# Patient Record
Sex: Female | Born: 1970
Health system: Southern US, Community
[De-identification: ages and names within clinical notes are randomized; demographics above are authoritative.]

## PROBLEM LIST (undated history)

## (undated) ENCOUNTER — Emergency Department (HOSPITAL_COMMUNITY): Admission: EM | Discharge status: Absent without leave | Payer: Medicaid Other

## (undated) DIAGNOSIS — I998 Other disorder of circulatory system: Secondary | ICD-10-CM

## (undated) DIAGNOSIS — J449 Chronic obstructive pulmonary disease, unspecified: Secondary | ICD-10-CM

## (undated) DIAGNOSIS — G43909 Migraine, unspecified, not intractable, without status migrainosus: Secondary | ICD-10-CM

## (undated) DIAGNOSIS — G4730 Sleep apnea, unspecified: Secondary | ICD-10-CM

## (undated) DIAGNOSIS — E669 Obesity, unspecified: Secondary | ICD-10-CM

## (undated) DIAGNOSIS — E119 Type 2 diabetes mellitus without complications: Secondary | ICD-10-CM

## (undated) DIAGNOSIS — R51 Headache: Secondary | ICD-10-CM

## (undated) DIAGNOSIS — I639 Cerebral infarction, unspecified: Secondary | ICD-10-CM

## (undated) DIAGNOSIS — E7800 Pure hypercholesterolemia, unspecified: Secondary | ICD-10-CM

## (undated) DIAGNOSIS — M545 Low back pain: Secondary | ICD-10-CM

## (undated) DIAGNOSIS — G8929 Other chronic pain: Secondary | ICD-10-CM

## (undated) DIAGNOSIS — F329 Major depressive disorder, single episode, unspecified: Secondary | ICD-10-CM

## (undated) DIAGNOSIS — F32A Depression, unspecified: Secondary | ICD-10-CM

## (undated) DIAGNOSIS — F419 Anxiety disorder, unspecified: Secondary | ICD-10-CM

## (undated) DIAGNOSIS — I10 Essential (primary) hypertension: Secondary | ICD-10-CM

## (undated) HISTORY — DX: Chronic obstructive pulmonary disease, unspecified: J44.9

## (undated) HISTORY — PX: CARDIAC CATHETERIZATION: SHX172

## (undated) HISTORY — DX: Cerebral infarction, unspecified: I63.9

---

## 2002-08-28 DIAGNOSIS — E119 Type 2 diabetes mellitus without complications: Secondary | ICD-10-CM

## 2002-08-28 HISTORY — DX: Type 2 diabetes mellitus without complications: E11.9

## 2006-12-16 ENCOUNTER — Observation Stay (HOSPITAL_COMMUNITY): Admission: EM | Admit: 2006-12-16 | Discharge: 2006-12-18 | Payer: Self-pay | Admitting: Emergency Medicine

## 2006-12-20 ENCOUNTER — Ambulatory Visit: Payer: Self-pay | Admitting: Nurse Practitioner

## 2007-01-22 ENCOUNTER — Ambulatory Visit: Payer: Self-pay | Admitting: *Deleted

## 2007-02-07 ENCOUNTER — Ambulatory Visit: Payer: Self-pay | Admitting: Internal Medicine

## 2007-02-08 ENCOUNTER — Ambulatory Visit (HOSPITAL_COMMUNITY): Admission: RE | Admit: 2007-02-08 | Discharge: 2007-02-08 | Payer: Self-pay | Admitting: Family Medicine

## 2008-01-28 ENCOUNTER — Emergency Department (HOSPITAL_COMMUNITY): Admission: EM | Admit: 2008-01-28 | Discharge: 2008-01-28 | Payer: Self-pay | Admitting: Emergency Medicine

## 2008-09-15 ENCOUNTER — Emergency Department (HOSPITAL_COMMUNITY): Admission: EM | Admit: 2008-09-15 | Discharge: 2008-09-15 | Payer: Self-pay | Admitting: Family Medicine

## 2008-11-20 ENCOUNTER — Emergency Department (HOSPITAL_COMMUNITY): Admission: EM | Admit: 2008-11-20 | Discharge: 2008-11-20 | Payer: Self-pay | Admitting: Emergency Medicine

## 2009-01-12 ENCOUNTER — Emergency Department (HOSPITAL_COMMUNITY): Admission: EM | Admit: 2009-01-12 | Discharge: 2009-01-12 | Payer: Self-pay | Admitting: Emergency Medicine

## 2009-11-23 ENCOUNTER — Emergency Department (HOSPITAL_COMMUNITY): Admission: EM | Admit: 2009-11-23 | Discharge: 2009-11-23 | Payer: Self-pay | Admitting: Emergency Medicine

## 2010-03-30 ENCOUNTER — Emergency Department (HOSPITAL_COMMUNITY): Admission: EM | Admit: 2010-03-30 | Discharge: 2010-03-30 | Payer: Self-pay | Admitting: Emergency Medicine

## 2010-04-19 ENCOUNTER — Ambulatory Visit: Payer: Self-pay | Admitting: Internal Medicine

## 2010-11-11 LAB — CBC
Hemoglobin: 13.2 g/dL (ref 12.0–15.0)
MCH: 24.7 pg — ABNORMAL LOW (ref 26.0–34.0)
MCHC: 33.2 g/dL (ref 30.0–36.0)
RBC: 5.35 MIL/uL — ABNORMAL HIGH (ref 3.87–5.11)
RDW: 15.4 % (ref 11.5–15.5)

## 2010-11-11 LAB — BASIC METABOLIC PANEL
BUN: 8 mg/dL (ref 6–23)
Chloride: 98 mEq/L (ref 96–112)
Creatinine, Ser: 0.92 mg/dL (ref 0.4–1.2)
Glucose, Bld: 161 mg/dL — ABNORMAL HIGH (ref 70–99)
Potassium: 3.5 mEq/L (ref 3.5–5.1)
Sodium: 135 mEq/L (ref 135–145)

## 2010-11-11 LAB — BRAIN NATRIURETIC PEPTIDE: Pro B Natriuretic peptide (BNP): 92 pg/mL (ref 0.0–100.0)

## 2010-11-11 LAB — D-DIMER, QUANTITATIVE: D-Dimer, Quant: 0.26 ug/mL-FEU (ref 0.00–0.48)

## 2010-11-11 LAB — DIFFERENTIAL
Eosinophils Absolute: 0.3 10*3/uL (ref 0.0–0.7)
Lymphocytes Relative: 34 % (ref 12–46)
Lymphs Abs: 2.9 10*3/uL (ref 0.7–4.0)

## 2010-11-11 LAB — GLUCOSE, CAPILLARY: Glucose-Capillary: 168 mg/dL — ABNORMAL HIGH (ref 70–99)

## 2010-11-11 LAB — TROPONIN I: Troponin I: 0.01 ng/mL (ref 0.00–0.06)

## 2010-11-20 LAB — CBC
HCT: 41.4 % (ref 36.0–46.0)
Hemoglobin: 13.3 g/dL (ref 12.0–15.0)
MCHC: 32.2 g/dL (ref 30.0–36.0)
Platelets: 277 10*3/uL (ref 150–400)
RBC: 5.37 MIL/uL — ABNORMAL HIGH (ref 3.87–5.11)
RDW: 17.1 % — ABNORMAL HIGH (ref 11.5–15.5)
WBC: 10.5 10*3/uL (ref 4.0–10.5)

## 2010-11-20 LAB — DIFFERENTIAL
Basophils Absolute: 0 10*3/uL (ref 0.0–0.1)
Basophils Relative: 0 % (ref 0–1)
Eosinophils Absolute: 0.2 10*3/uL (ref 0.0–0.7)
Eosinophils Relative: 1 % (ref 0–5)
Lymphocytes Relative: 35 % (ref 12–46)
Monocytes Absolute: 0.9 10*3/uL (ref 0.1–1.0)

## 2010-11-20 LAB — COMPREHENSIVE METABOLIC PANEL
ALT: 16 U/L (ref 0–35)
Albumin: 3.8 g/dL (ref 3.5–5.2)
CO2: 27 mEq/L (ref 19–32)
Calcium: 9.4 mg/dL (ref 8.4–10.5)
Chloride: 102 mEq/L (ref 96–112)
GFR calc Af Amer: >60 mL/min (ref >60)
Glucose, Bld: 130 mg/dL — ABNORMAL HIGH (ref 70–99)
Potassium: 2.8 mEq/L — ABNORMAL LOW (ref 3.5–5.1)
Sodium: 137 mEq/L (ref 135–145)
Total Bilirubin: 0.8 mg/dL (ref 0.3–1.2)

## 2010-11-20 LAB — RAPID URINE DRUG SCREEN, HOSP PERFORMED: Barbiturates: NOT DETECTED

## 2010-11-20 LAB — ETHANOL: Alcohol, Ethyl (B): <5 mg/dL (ref 0–10)

## 2010-12-06 LAB — POCT CARDIAC MARKERS
CKMB, poc: 2.2 ng/mL (ref 1.0–8.0)
Troponin i, poc: <0.05 ng/mL (ref 0.00–0.09)

## 2010-12-06 LAB — BASIC METABOLIC PANEL
BUN: 8 mg/dL (ref 6–23)
Chloride: 101 mEq/L (ref 96–112)
Glucose, Bld: 149 mg/dL — ABNORMAL HIGH (ref 70–99)
Potassium: 2.8 mEq/L — ABNORMAL LOW (ref 3.5–5.1)
Sodium: 136 mEq/L (ref 135–145)

## 2010-12-08 LAB — DIFFERENTIAL
Basophils Absolute: 0.1 10*3/uL (ref 0.0–0.1)
Lymphocytes Relative: 30 % (ref 12–46)
Monocytes Absolute: 0.6 10*3/uL (ref 0.1–1.0)
Neutro Abs: 5.6 10*3/uL (ref 1.7–7.7)
Neutrophils Relative %: 61 % (ref 43–77)

## 2010-12-08 LAB — POCT CARDIAC MARKERS
Myoglobin, poc: 89.1 ng/mL (ref 12–200)
Troponin i, poc: <0.05 ng/mL (ref 0.00–0.09)

## 2010-12-08 LAB — POCT I-STAT, CHEM 8
BUN: 7 mg/dL (ref 6–23)
Calcium, Ion: 1.11 mmol/L — ABNORMAL LOW (ref 1.12–1.32)
HCT: 41 % (ref 36.0–46.0)
Hemoglobin: 13.9 g/dL (ref 12.0–15.0)
Sodium: 138 mEq/L (ref 135–145)
TCO2: 27 mmol/L (ref 0–100)

## 2010-12-08 LAB — D-DIMER, QUANTITATIVE: D-Dimer, Quant: 0.27 ug/mL-FEU (ref 0.00–0.48)

## 2010-12-08 LAB — POCT PREGNANCY, URINE: Preg Test, Ur: NEGATIVE

## 2010-12-08 LAB — CBC
Hemoglobin: 12.5 g/dL (ref 12.0–15.0)
RDW: 16.5 % — ABNORMAL HIGH (ref 11.5–15.5)

## 2010-12-08 LAB — PROTIME-INR
INR: 1 (ref 0.00–1.49)
Prothrombin Time: 13.1 seconds (ref 11.6–15.2)

## 2010-12-12 LAB — GLUCOSE, CAPILLARY: Glucose-Capillary: 170 mg/dL — ABNORMAL HIGH (ref 70–99)

## 2011-01-10 NOTE — H&P (Signed)
NAMEESLI, Diamond NO.:  [id number]   MEDICAL RECORD NO.:  [id number]           PATIENT TYPE:   LOCATION:                                 FACILITY:   PHYSICIAN:  Sheliah Mends, MD      DATE OF BIRTH:  05/08/1971   DATE OF ADMISSION:  DATE OF DISCHARGE:                              HISTORY & PHYSICAL   [name] is a 40 year old obese African American female who was seen  in the emergency room for fatigue and shortness of breath.  We were  called to see for some abnormal EKG changes.  She apparently has had a  stress test in our office a couple of years ago that presumably showed  no ischemia.  Her office records are pending.  In fact, her symptoms are  most consistent with upper respiratory infection.  She says she has  recently come out of a bad relationship and started smoking some.  Her  EKG changes are not significant and actually her EKG looks the same as  it did in 2008.  Dr. Garen Lah saw her in the emergency room and feels  she can be discharged and we can follow her up as an outpatient.   Past medical history is remarkable for hypertension.  She has non-  insulin-dependent diabetes.  She has obesity and asthma.   CURRENT MEDICATIONS:  1. Labetalol 150 mg b.i.d.  2. Norvasc 10 mg a day.  3. Metformin 500 mg b.i.d.  4. Hydrochlorothiazide 25 mg a day.   She has no known drug allergies.   SOCIAL HISTORY:  She works at [employer].  She has just  resumed smoking.   Family history is remarkable that her mother had an MI in her mid 33s.   Review of systems is essentially unremarkable except as noted above.   PHYSICAL EXAMINATION:  VITAL SIGNS:  Blood pressure initially is 165/112  with a pulse 100, temperature 98.7, followup pressure is 146/96, pulse  96, respirations 12.  GENERAL:  She is a morbidly obese African American female in no acute  distress.  HEENT: Normocephalic.  Extraocular movements are intact.  Sclerae is  nonicteric.  Lids and conjunctivae are within normal limits.  NECK:  Without thyroid enlargement, without JVD.  CHEST:  Some expiratory wheezing bilaterally.  CARDIAC:  Regular rate and rhythm without murmur, rub, or gallop.  Normal S1 and S2.  ABDOMEN:  Obese.  EXTREMITIES:  Trace edema.  NEUROLOGIC:  Grossly intact.  She is awake, alert, oriented,  cooperative, and moves all extremities without obvious deficit.   EKG shows sinus rhythm with nonspecific ST changes, she has septal Q-  waves and poor anterior R-wave progression and LVH by voltage.  Her labs  in the emergency room shows sodium 138, potassium 2.9, BUN 7, creatinine  1.0.  Troponin is negative.  Pregnancy test is negative.  White count  9.2, hemoglobin 12.5, hematocrit 38.5, platelets 305.   IMPRESSION:  1. Abnormal EKG.  2. Morbid obesity.  3. Asthmatic chronic obstructive pulmonary disease and smoking.  4. Hypertension with suboptimal control.  5. Family history of coronary disease.  6. Unknown lipid status.   PLAN:  The patient was seen by Dr. Garen Lah and myself in the emergency  room.  We have added lisinopril 10 mg a day to her current medications.  Dr. Garen Lah will see her in followup as an outpatient in a couple of  weeks.  She will need a followup BMP at that time and also followup  lipids at some point.      Abelino Derrick, P.A.      Sheliah Mends, MD  Electronically Signed    LKK/MEDQ  D:  11/20/2008  T:  11/21/2008  Job:  [id number]

## 2011-01-13 NOTE — Discharge Summary (Signed)
Diamond Rice, Diamond Rice               ACCOUNT NO.:  [id number]   MEDICAL RECORD NO.:  [id number]          PATIENT TYPE:  OBV   LOCATION:  2003                         FACILITY:  [facility]   PHYSICIAN:  Michaelyn Barter, M.D. DATE OF BIRTH:  [birth year]   DATE OF ADMISSION:  12/15/2006  DATE OF DISCHARGE:  12/18/2006                               DISCHARGE SUMMARY   PRIMARY CARE DOCTOR:  Unassigned.   FINAL DIAGNOSES:  1. Chest pain.  2. Uncontrolled diabetes mellitus.   CONSULTATION:  Cardiology, with [facility].   PROCEDURES:  1. Portable chest x-ray, completed December 16, 2006.  2. Nuclear medicine Myoview stress test, completed December 17, 2006.   HISTORY OF PRESENT ILLNESS:  Diamond Rice is a 40 year old female who  arrived with a chief complaint of shortness of breath and severe chest  pressure.  She indicated that the chest pressure was located over the  left upper region of her chest.  It is described as approximately 7/10  in intensity.  She also complained that she had been experiencing chest  pain off and on for several months.   PAST MEDICAL HISTORY:  Please see that dictated by Dr. Della Goo  on December 16, 2006.   HOSPITAL COURSE:  1. Chest pain.  A portable chest x-ray was completed on December 16, 2006.  It revealed no acute chest findings.  An EKG was completed.      It revealed normal sinus rhythm, with some mildly inverted T waves      in leads V5 and V6.  The patient's cardiac markers were cycled.      Her troponin I's were 0.01, 0.02, and 0.01.  Her CK-MBs were 1.2,      1.5, and 2.  Therefore, she ruled out for a cardiac event.  Her D-      dimer was also found to be negative, with a result of less than      0.22.  [facility] was consulted.  The      patient underwent a nuclear medicine Myoview stress test on December 17, 2006.  There was no evidence of exercise-induced myocardial      ischemia seen.  The  patient's chest pain and shortness of breath      have both resolved over the course of her hospitalization.  2. Uncontrolled diabetes mellitus.  The patient's sugars have been      monitored very closely.  Her medications have been adjusted to try      to better control her sugars.   CONDITION AT THE TIME OF DISCHARGE:  On the date of discharge, the  patient indicates that she feels better.  She denies having any current  chest pain or shortness of breath.  Her temperature is 98.2, heart rate  81, respirations 18, blood pressure 129/90, O2 saturation 98% on room  air.   LABORATORIES:  Her sodium is 137, potassium 3.6, chloride 103, CO2 28,  BUN 13, creatinine 0.92, glucose 129, calcium 9.5.   The decision  has been made to discharge the patient from the hospital.  The patient indicates that she is currently living in a treatment  facility for both alcohol and substance abuse.   She will be discharged on:  1. Norvasc 10 mg 1 tablet p.o. daily.  2. Ecotrin 325 mg p.o. daily.  3. Glipizide 5 mg p.o. daily.  4. Hydrochlorothiazide 25 mg p.o. daily.  5. Labetalol 200 mg p.o. b.i.d.  6. Metformin 500 mg p.o. b.i.d.  7. Protonix 40 mg p.o. daily.      Michaelyn Barter, M.D.  Electronically Signed     OR/MEDQ  D:  12/18/2006  T:  12/18/2006  Job:  [id number]

## 2011-01-13 NOTE — H&P (Signed)
Diamond Rice, GUIZAR               ACCOUNT NO.:  [id number]   MEDICAL RECORD NO.:  [id number]          PATIENT TYPE:  INP   LOCATION:  1823                         FACILITY:  [facility]   PHYSICIAN:  Della Goo, M.D. DATE OF BIRTH:  [birth year]   DATE OF ADMISSION:  12/16/2006  DATE OF DISCHARGE:                              HISTORY & PHYSICAL   PRIMARY CARE PHYSICIAN:  Unassigned.   CHIEF COMPLAINT:  Shortness of breath and chest pain.   HISTORY OF PRESENT ILLNESS:  This is a 40 year old female who was  brought to the emergency department secondary to complaints of worsening  shortness of breath over a 24-hour period along with complaints of  severe chest pressure located in the left upper chest.  The patient  denies having any nausea, vomiting or diaphoresis associated with this  pain.  She denies that the pain radiated into the arm or neck.  The pain  did persist until the patient was seen and administered nitroglycerin  sublingual x1.  The pain was described as being a 7/10 in intensity.  The patient does report having chest pain off and on for months;  however, this time the shortness of breath was a new symptom according  to her.  The patient does report being off her medications for  approximately 8 days.  She has recently moved from [place], [place] to this area.  She has already undergone an eligibility for the  [employer]; however, she has an upcoming appointment.   PAST MEDICAL HISTORY:  Hypertension.   PAST SURGICAL HISTORY:  None.   MEDICATIONS:  1. Labetalol 150 mg one p.o. b.i.d.  2. Norvasc 10 mg 2 tablets p.o. daily.  3. Hydrochlorothiazide 25 mg one p.o. daily.   ALLERGIES:  NO KNOWN DRUG ALLERGIES.   SOCIAL HISTORY:  The patient lives with 5 roommates.  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  Tobacco history - 1-2 cigarettes twice  a month.  No history of alcohol usage.   FAMILY HISTORY:  Positive for coronary artery disease in  her mother who  died at age 6.  Positive for hypertension in both parents.  Positive  for diabetes mellitus in both parents.  No history of cancer that she  knows of.   REVIEW OF SYSTEMS:  Pertinents are mentioned above.  She denies having  any joint pain or myalgias, syncope, dizziness, GI or GU problems.  She  does report having regular menstrual periods.   PHYSICAL EXAMINATION:  GENERAL:  This is a morbidly obese 40 year old  female currently in no acute distress.  VITAL SIGNS:  Temperature 98.3, blood pressure 151/94, heart rate 87,  respirations 22, O2 saturations 100%.  HEENT:  Normocephalic, atraumatic.  Pupils equally round react to light.  Extraocular muscles are intact.  Funduscopic benign.  Oropharynx is  clear.  NECK:  Supple.  Full range of motion.  No thyromegaly, adenopathy,  jugular venous distension.  CARDIOVASCULAR:  Regular rate and rhythm.  No murmurs, gallops or rubs.  LUNGS:  Clear to auscultation bilaterally.  ABDOMEN:  Positive bowel sounds, soft,  nontender, nondistended.  EXTREMITIES:  Without edema.  NEUROLOGIC:  Alert and oriented x3.  There are no focal deficits.  GENITOURINARY/RECTAL:  Deferred.   LABORATORY STUDIES:  Hemoglobin 13.6, hematocrit 40.0.  Sodium 133,  potassium 3.6, chloride 100, CO2 of 25, BUN 10, creatinine 0.8, glucose  157.  Myoglobin 81.6, CK-MB 1.6 and troponin less than 0.05.   ELECTROCARDIOGRAM:  Normal sinus rhythm and T-wave inversion in the  anterior lateral leads.  This has been reported as being an age  indeterminate anteroseptal infarction.   ASSESSMENT:  30. A 40 year old female with a history of severe hypertension being      admitted with chest pain.  2. Hypertension.  3. Mild hyperglycemia.  4. Morbid obesity.   PLAN:  The patient will be admitted to telemetry area for cardiac  monitoring.  Nitrates, oxygen, aspirin and beta blocker therapy will be  continued.  She will continue on her regular medications.  Lovenox  has  been written at DVT prophylaxis dosage at this time.  GI prophylaxis has  also been ordered.  CBGs will be ordered q.4h. and a hemoglobin A1c will  be checked along with sliding scale insulin coverage p.r.n.  The patient  has also been written for pain control with IV Dilaudid p.r.n.  Further  workup will be considered by the rounding team pending results of the  these studies.  Further workup may also ensue an outpatient if the  cardiac enzymes return negative x3.      Della Goo, M.D.  Electronically Signed     HJ/MEDQ  D:  12/16/2006  T:  12/16/2006  Job:  [id number]

## 2011-05-25 LAB — POCT I-STAT, CHEM 8
Chloride: 103
HCT: 41
Hemoglobin: 13.9
Potassium: 3 — ABNORMAL LOW
Sodium: 139

## 2011-10-23 ENCOUNTER — Emergency Department (HOSPITAL_COMMUNITY): Payer: Medicaid Other

## 2011-10-23 ENCOUNTER — Encounter (HOSPITAL_COMMUNITY): Payer: Self-pay | Admitting: *Deleted

## 2011-10-23 ENCOUNTER — Emergency Department (HOSPITAL_COMMUNITY)
Admission: EM | Admit: 2011-10-23 | Discharge: 2011-10-23 | Disposition: A | Discharge status: Home / Self Care | Payer: Medicaid Other | Attending: Emergency Medicine | Admitting: Emergency Medicine

## 2011-10-23 DIAGNOSIS — I10 Essential (primary) hypertension: Secondary | ICD-10-CM | POA: Insufficient documentation

## 2011-10-23 DIAGNOSIS — M25579 Pain in unspecified ankle and joints of unspecified foot: Secondary | ICD-10-CM | POA: Insufficient documentation

## 2011-10-23 DIAGNOSIS — M25473 Effusion, unspecified ankle: Secondary | ICD-10-CM | POA: Insufficient documentation

## 2011-10-23 DIAGNOSIS — S93409A Sprain of unspecified ligament of unspecified ankle, initial encounter: Secondary | ICD-10-CM | POA: Insufficient documentation

## 2011-10-23 DIAGNOSIS — M25476 Effusion, unspecified foot: Secondary | ICD-10-CM | POA: Insufficient documentation

## 2011-10-23 DIAGNOSIS — M79609 Pain in unspecified limb: Secondary | ICD-10-CM | POA: Insufficient documentation

## 2011-10-23 DIAGNOSIS — W010XXA Fall on same level from slipping, tripping and stumbling without subsequent striking against object, initial encounter: Secondary | ICD-10-CM | POA: Insufficient documentation

## 2011-10-23 DIAGNOSIS — E119 Type 2 diabetes mellitus without complications: Secondary | ICD-10-CM | POA: Insufficient documentation

## 2011-10-23 HISTORY — DX: Essential (primary) hypertension: I10

## 2011-10-23 MED ORDER — OXYCODONE-ACETAMINOPHEN 5-325 MG PO TABS: 1.0000 | ORAL_TABLET | Freq: Four times a day (QID) | ORAL | Status: AC | PRN

## 2011-10-23 NOTE — ED Provider Notes (Signed)
History     CSN: [id number]  Arrival date & time 10/23/11  1216   First MD Initiated Contact with Patient 10/23/11 1331      Chief Complaint  Patient presents with  . Fall  . Ankle Pain  . Foot Pain    (Consider location/radiation/quality/duration/timing/severity/associated sxs/prior treatment) Patient is a 41 y.o. female presenting with fall, ankle pain, and lower extremity pain. The history is provided by the patient.  Fall Incident onset: 2 weeks ago. The fall occurred while walking (tripped and fell down a hill twisting her ankle). She fell from a height of 1 to 2 ft. She landed on grass. There was no blood loss. Point of impact: Left ankle. Pain location: Left ankle. The pain is at a severity of 8/10. The pain is moderate. She was ambulatory at the scene. The symptoms are aggravated by activity and use of the injured limb. She has tried acetaminophen and ice for the symptoms. The treatment provided no relief.  Ankle Pain   Foot Pain    Past Medical History  Diagnosis Date  . Diabetes mellitus   . Hypertension     Past Surgical History  Procedure Date  . Cardiac catheterization     No family history on file.  History  Substance Use Topics  . Smoking status: Current Everyday Smoker  . Smokeless tobacco: Not on file  . Alcohol Use: Yes    OB History    Grav Para Term Preterm Abortions TAB SAB Ect Mult Living                  Review of Systems  All other systems reviewed and are negative.    Allergies  Morphine and related  Home Medications  No current outpatient prescriptions on file.  BP 151/107  Pulse 92  Temp(Src) 97.4 F (36.3 C) (Oral)  Resp 16  SpO2 99%  Physical Exam  Nursing note and vitals reviewed. Constitutional: She is oriented to person, place, and time. She appears well-developed and well-nourished. No distress.  HENT:  Head: Normocephalic and atraumatic.  Eyes: EOM are normal. Pupils are equal, round, and reactive to light.    Musculoskeletal: She exhibits tenderness.       Left ankle: She exhibits swelling. She exhibits no ecchymosis, no deformity and normal pulse. tenderness. Lateral malleolus tenderness found. No proximal fibula tenderness found.  Neurological: She is alert and oriented to person, place, and time.  Skin: Skin is warm and dry. No rash noted. No erythema.  Psychiatric: She has a normal mood and affect. Her behavior is normal.    ED Course  Procedures (including critical care time)  Labs Reviewed - No data to display Dg Ankle Complete Left  10/23/2011  *RADIOLOGY REPORT*  Clinical Data: Larey Seat.  Left ankle pain.  LEFT ANKLE COMPLETE - 3+ VIEW  Comparison: None  Findings: The ankle mortise is maintained.  No acute ankle fracture.  No osteochondral lesion.  The mid and hind foot bony structures are intact.  Mild midfoot degenerative changes.  A large calcaneal heel spur is noted.  IMPRESSION: No acute bony findings.  Original Report Authenticated By: P. Loralie Champagne, M.D.     1. Ankle sprain       MDM   Patient with symptoms concerning for an ankle sprain. She fell then the whole 2 weeks ago and has persistent swelling of her left lateral malleolus. There is no fibular head tenderness or tenderness in the lower foot. Plain film of the  ankle negative for broken bones. Feel this is a persistent sprain. Will have her use brace and crutches. In the week off of work.        Gwyneth Sprout, MD 10/23/11 [phone]

## 2011-10-23 NOTE — Discharge Instructions (Signed)
Ankle Sprain °An ankle sprain is an injury to the ligaments that hold the ankle joint together.  °CAUSES °The injury is usually caused by a fall or by twisting the ankle. It is important to tell your caregiver how the injury occurred and whether or not you were able to walk immediately after the injury.  °SYMPTOMS  °Pain is the primary symptom. It may be present at rest or only when you are trying to stand or walk. The ankle will likely be swollen. Bruising may develop immediately or after 1 or 2 days. It may be difficult or impossible to stand or walk. This depends on the severity of the sprain. °DIAGNOSIS  °Your caregiver can determine if a sprain has occurred based on the accident details and on examination of your ankle. Examination will include pressing and squeezing areas of the foot and ankle. Your caregiver will try to move the ankle in certain ways. X-rays may be used to be sure a bone was not broken, or that the ligament did not pull off of a bone (avulsion). There are standard guidelines that can reliably determine if an X-ray is needed. °TREATMENT  °Rest, ice, elevation, and compression are the basic modes of treatment. Certain types of braces can help stabilize the ankle and allow early return to walking. Your caregiver can make a recommendation for this. Medication may be recommended for pain. You may be referred to an orthopedist or a physical therapist for certain types of severe sprains. °HOME CARE INSTRUCTIONS  °· Apply ice to the sore area for 15 to 20 minutes, 3 to 4 times per day. Do this while you are awake for the first 2 days, or as directed. This can be stopped when the swelling goes away. Put the ice in a plastic bag and place a towel between the bag of ice and your skin.  °· Keep your leg elevated when possible to lessen swelling.  °· If your caregiver recommends crutches, use them as instructed with a non-weight bearing cast for 1 week. Then, you may walk on your ankle as the pain allows,  or as instructed. Gradually, put weight on the affected ankle. Continue to use crutches or a cane until you can walk without causing pain.  °· If a plaster splint was applied, wear the splint until you are seen for a follow-up examination. Rest it on nothing harder than a pillow the first 24 hours. Do not put weight on it. Do not get it wet. You may take it off to take a shower or bath.  °· You may have been given an elastic bandage to use with the plaster splint, or you may have been given a elastic bandage to use alone. The elastic bandage is too tight if you have numbness, tingling, or if your foot becomes cold and blue. Adjust the bandage to make it comfortable.  °· If an air splint was applied, you may blow more air into it or take some out to make it more comfortable. You may take it off at night and to take a shower or bath. Wiggle your toes in the splint several times per day if you are able.  °· Only take over-the-counter or prescription medicines for pain, discomfort, or fever as directed by your caregiver.  °· Do not drive a vehicle until your caregiver specifically tells you it is safe to do so.  °SEEK MEDICAL CARE IF:  °· You have an increase in bruising, swelling, or pain.  °· Your   toes feel cold.  °· Pain relief is not achieved with medications.  °SEEK IMMEDIATE MEDICAL CARE IF: °Your toes are numb or blue or you have severe pain. °MAKE SURE YOU:  °· Understand these instructions.  °· Will watch your condition.  °· Will get help right away if you are not doing well or get worse.  °Document Released: 08/14/2005 Document Revised: 11/18/2010 Document Reviewed: 03/18/2008 °[employer]® Patient Information ©2012 [employer]. °

## 2011-10-23 NOTE — ED Notes (Signed)
Reports injured left ankle 2 weeks ago, c/o continued pain & now with tingling radiating up into calf area. No deformity, redness, edema

## 2011-10-23 NOTE — ED Notes (Signed)
Patient reports she fell 1 mth ago.  She had swelling in her left ankle that has continued and she reports she has periods of feeling numbness in her ankle and foot.

## 2011-10-23 NOTE — Progress Notes (Signed)
Orthopedic Tech Progress Note Patient Details:  ANABIA WEATHERWAX 07/22/1971 [id number]  Other Ortho Devices Type of Ortho Device: Crutches Ortho Device Interventions: Application  Type of Splint: Ankle Air Splint Interventions: Application  Applied ankle air cast per patient nurse.  Gaye Pollack 10/23/2011, 4:05 PM

## 2011-10-23 NOTE — ED Notes (Signed)
Awaiting ortho tech for air splint & crutches

## 2011-10-23 NOTE — ED Notes (Signed)
Ortho paged for crutches 

## 2012-03-24 ENCOUNTER — Emergency Department (HOSPITAL_COMMUNITY): Payer: Self-pay

## 2012-03-24 ENCOUNTER — Emergency Department (HOSPITAL_COMMUNITY)
Admission: EM | Admit: 2012-03-24 | Discharge: 2012-03-24 | Disposition: A | Discharge status: Home / Self Care | Payer: Self-pay | Attending: Emergency Medicine | Admitting: Emergency Medicine

## 2012-03-24 ENCOUNTER — Encounter (HOSPITAL_COMMUNITY): Payer: Self-pay | Admitting: *Deleted

## 2012-03-24 DIAGNOSIS — I10 Essential (primary) hypertension: Secondary | ICD-10-CM

## 2012-03-24 DIAGNOSIS — E119 Type 2 diabetes mellitus without complications: Secondary | ICD-10-CM

## 2012-03-24 DIAGNOSIS — R0602 Shortness of breath: Secondary | ICD-10-CM | POA: Insufficient documentation

## 2012-03-24 DIAGNOSIS — F17200 Nicotine dependence, unspecified, uncomplicated: Secondary | ICD-10-CM | POA: Insufficient documentation

## 2012-03-24 DIAGNOSIS — R079 Chest pain, unspecified: Secondary | ICD-10-CM

## 2012-03-24 DIAGNOSIS — M549 Dorsalgia, unspecified: Secondary | ICD-10-CM

## 2012-03-24 LAB — URINALYSIS, ROUTINE W REFLEX MICROSCOPIC
Glucose, UA: >1000 mg/dL — AB
Ketones, ur: NEGATIVE mg/dL
Leukocytes, UA: NEGATIVE
Nitrite: NEGATIVE
Protein, ur: 100 mg/dL — AB

## 2012-03-24 LAB — BASIC METABOLIC PANEL
BUN: 14 mg/dL (ref 6–23)
Chloride: 100 mEq/L (ref 96–112)
GFR calc Af Amer: 86 mL/min — ABNORMAL LOW (ref >90)
GFR calc non Af Amer: 74 mL/min — ABNORMAL LOW (ref >90)
Glucose, Bld: 302 mg/dL — ABNORMAL HIGH (ref 70–99)
Potassium: 3.8 mEq/L (ref 3.5–5.1)
Sodium: 136 mEq/L (ref 135–145)

## 2012-03-24 LAB — POCT I-STAT TROPONIN I

## 2012-03-24 LAB — CBC
HCT: 37.5 % (ref 36.0–46.0)
Hemoglobin: 12.2 g/dL (ref 12.0–15.0)
MCHC: 32.5 g/dL (ref 30.0–36.0)
WBC: 9.5 10*3/uL (ref 4.0–10.5)

## 2012-03-24 LAB — URINE MICROSCOPIC-ADD ON

## 2012-03-24 MED ORDER — METFORMIN HCL 500 MG PO TABS: 500.0000 mg | ORAL_TABLET | Freq: Two times a day (BID) | ORAL | Status: DC

## 2012-03-24 MED ORDER — KETOROLAC TROMETHAMINE 60 MG/2ML IM SOLN
60.0000 mg | Freq: Once | INTRAMUSCULAR | Status: AC
  Administered 2012-03-24: 60 mg via INTRAMUSCULAR
  Filled 2012-03-24: qty 2

## 2012-03-24 MED ORDER — ALBUTEROL SULFATE HFA 108 (90 BASE) MCG/ACT IN AERS
2.0000 | INHALATION_SPRAY | RESPIRATORY_TRACT | Status: DC | PRN
  Administered 2012-03-24: 2 via RESPIRATORY_TRACT
  Filled 2012-03-24: qty 6.7

## 2012-03-24 MED ORDER — METHOCARBAMOL 500 MG PO TABS: 500.0000 mg | ORAL_TABLET | Freq: Four times a day (QID) | ORAL | Status: AC | PRN

## 2012-03-24 MED ORDER — LISINOPRIL 10 MG PO TABS: 10.0000 mg | ORAL_TABLET | Freq: Every day | ORAL | Status: DC

## 2012-03-24 NOTE — ED Provider Notes (Signed)
History     CSN: [id number]  Arrival date & time 03/24/12  1159   First MD Initiated Contact with Patient 03/24/12 1336      Chief Complaint  Patient presents with  . Shortness of Breath  . Chest Pain  . Back Pain    (Consider location/radiation/quality/duration/timing/severity/associated sxs/prior treatment) HPI Comments: Patient presents with multiple complaints.  She complains of bilateral mid back pain for the last 3 days.  She had no acute injury or trauma.  She denies fevers or dysuria.  She states it hurts no matter what she does.  It can be worse with certain movement and twisting.  Patient notes some chest pain and shortness of breath that began this morning at rest.  She has no cough or fevers.  Patient notes no specific inciting or relieving factors.  On my medical record reviewed patient had a normal stress test in 2008.  She also reports that she had a catheterization in [place] which did not require any stenting or intervention.  The history is provided by the patient. No language interpreter was used.    Past Medical History  Diagnosis Date  . Diabetes mellitus   . Hypertension     Past Surgical History  Procedure Date  . Cardiac catheterization     History reviewed. No pertinent family history.  History  Substance Use Topics  . Smoking status: Current Everyday Smoker  . Smokeless tobacco: Not on file  . Alcohol Use: Yes    OB History    Grav Para Term Preterm Abortions TAB SAB Ect Mult Living                  Review of Systems  Constitutional: Negative.  Negative for fever and chills.  HENT: Negative.   Eyes: Negative.   Respiratory: Positive for shortness of breath. Negative for cough.   Cardiovascular: Positive for chest pain.  Gastrointestinal: Negative.  Negative for nausea, vomiting, abdominal pain and diarrhea.  Genitourinary: Negative.  Negative for dysuria.  Musculoskeletal: Positive for back pain.  Skin: Negative.  Negative for color  change and rash.  Neurological: Negative.  Negative for syncope and headaches.  Hematological: Negative.  Negative for adenopathy.  Psychiatric/Behavioral: Negative.  Negative for confusion.  All other systems reviewed and are negative.    Allergies  Morphine and related  Home Medications  No current outpatient prescriptions on file.  BP 155/97  Pulse 83  Temp 98.8 F (37.1 C) (Oral)  Resp 20  SpO2 100%  LMP 03/11/2012  Physical Exam  Nursing note and vitals reviewed. Constitutional: She is oriented to person, place, and time. She appears well-developed and well-nourished.  Non-toxic appearance. She does not have a sickly appearance.  HENT:  Head: Normocephalic and atraumatic.  Eyes: Conjunctivae, EOM and lids are normal. Pupils are equal, round, and reactive to light. No scleral icterus.  Neck: Trachea normal and normal range of motion. Neck supple.  Cardiovascular: Normal rate, regular rhythm and normal heart sounds.   Pulmonary/Chest: Effort normal and breath sounds normal. No respiratory distress. She has no wheezes. She has no rales. She exhibits no tenderness.  Abdominal: Soft. Normal appearance. There is no tenderness. There is no rebound, no guarding and no CVA tenderness.  Genitourinary:       No CVA tenderness bilaterally  Musculoskeletal: Normal range of motion. She exhibits no edema.       No focal T-spine or L-spine tenderness on examination  Neurological: She is alert and oriented to  person, place, and time. She has normal strength.  Skin: Skin is warm, dry and intact. No rash noted.  Psychiatric: She has a normal mood and affect. Her behavior is normal. Judgment and thought content normal.    ED Course  Procedures (including critical care time)  Results for orders placed during the hospital encounter of 03/24/12  CBC      Component Value Range   WBC 9.5  4.0 - 10.5 K/uL   RBC 5.13 (*) 3.87 - 5.11 MIL/uL   Hemoglobin 12.2  12.0 - 15.0 g/dL   HCT 40.1   02.7 - 25.3 %   MCV 73.1 (*) 78.0 - 100.0 fL   MCH 23.8 (*) 26.0 - 34.0 pg   MCHC 32.5  30.0 - 36.0 g/dL   RDW 66.4 (*) 40.3 - 47.4 %   Platelets 351  150 - 400 K/uL  BASIC METABOLIC PANEL      Component Value Range   Sodium 136  135 - 145 mEq/L   Potassium 3.8  3.5 - 5.1 mEq/L   Chloride 100  96 - 112 mEq/L   CO2 27  19 - 32 mEq/L   Glucose, Bld 302 (*) 70 - 99 mg/dL   BUN 14  6 - 23 mg/dL   Creatinine, Ser 2.59  0.50 - 1.10 mg/dL   Calcium 9.1  8.4 - 56.3 mg/dL   GFR calc non Af Amer 74 (*) >90 mL/min   GFR calc Af Amer 86 (*) >90 mL/min  POCT I-STAT TROPONIN I      Component Value Range   Troponin i, poc 0.00  0.00 - 0.08 ng/mL   Comment 3           URINALYSIS, ROUTINE W REFLEX MICROSCOPIC      Component Value Range   Color, Urine YELLOW  YELLOW   APPearance CLEAR  CLEAR   Specific Gravity, Urine 1.030  1.005 - 1.030   pH 5.5  5.0 - 8.0   Glucose, UA >1000 (*) NEGATIVE mg/dL   Hgb urine dipstick NEGATIVE  NEGATIVE   Bilirubin Urine NEGATIVE  NEGATIVE   Ketones, ur NEGATIVE  NEGATIVE mg/dL   Protein, ur 875 (*) NEGATIVE mg/dL   Urobilinogen, UA 0.2  0.0 - 1.0 mg/dL   Nitrite NEGATIVE  NEGATIVE   Leukocytes, UA NEGATIVE  NEGATIVE  PREGNANCY, URINE      Component Value Range   Preg Test, Ur NEGATIVE  NEGATIVE  URINE MICROSCOPIC-ADD ON      Component Value Range   Squamous Epithelial / LPF FEW (*) RARE   WBC, UA 0-2  <3 WBC/hpf   Dg Chest 2 View  03/24/2012  *RADIOLOGY REPORT*  Clinical Data: Chest pain and back pain.  CHEST - 2 VIEW  Comparison: 03/30/2010  Findings: Two view of the chest demonstrate a new linear density in the left mid lung suggestive for atelectasis.  Heart size is upper limits of normal but stable.  Trachea is midline.  No evidence for pleural effusions.  IMPRESSION: New linear density in the left lung is suggestive for atelectasis.  Original Report Authenticated By: Richarda Overlie, M.D.      Date: 03/24/2012  Rate: 91  Rhythm: normal sinus rhythm   QRS Axis: left  Intervals: normal  ST/T Wave abnormalities: Inverted T waves in I, avL, flattened in V5-6  Conduction Disutrbances:none  Narrative Interpretation:   Old EKG Reviewed: unchanged from 03-30-10    MDM  Patient with symptoms that could potentially be  ACS and patient has a heart score of 3 for risk factors and nonspecific ST changes.  Patient does not have change in symptoms with exertion and had a normal stress test in 2008.  Patient has no acute changes on her EKG.  She has an initial negative troponin and a normal chest x-ray.  By heart score criteria patient is low risk and I believe is appropriate for 2 sets of cardiac markers and then discharge.  Patient's back pain may be musculoskeletal in origin as it is bilateral and worse with certain movements.  She does not have a UTI on urinalysis.  She has no focal pain over her spine.  Patient has no fevers or night sweats to suggest occult infection.  Patient can be moved to CDU while awaiting her second set of cardiac markers and can be discharged with a new prescription for metformin which the patient had previously been on for diabetes.  Patient will be referred for primary care physician as well.        Nat Christen, MD 03/24/12 [phone]

## 2012-03-24 NOTE — ED Notes (Signed)
Reports lower back pain for several days, today having sob and chest heaviness. No acute distress noted at triage.

## 2012-03-24 NOTE — ED Provider Notes (Signed)
Medical screening examination/treatment/procedure(s) were conducted as a shared visit with non-physician practitioner(s) and myself.  I personally evaluated the patient during the encounter   Nat Christen, MD 03/24/12 1700

## 2012-03-24 NOTE — ED Provider Notes (Signed)
Pt moved to the CDU after initial evaluation by EDP Hosmer to await completion of 2nd Troponin. CP and SOB today with no exertional component, unchanged EKG from 2 years ago, prior negative cardiac cath. Also with some hyperglycemia, felt to be related to lack of DM medication (which she has not had in some time secondary to no PCP) as well as mid back pain for several days, felt to be MSK in origin.  On my exam, pt is alert and oriented, NAD. Lungs CTAB, speaks in complete sentences. Heart RRR without murmur. Extremities without edema.  4:51 PM Second troponin has returned as negative. Discussed results with pt, who will be d.c home at this time. We will restart her metformin at prior dose of 500mg  BID. Also, BP has been elevated here and she reports previously taking several blood pressure medications. Has tolerated lisinopril in the past and we will also restart this medication today. Finally, she requests an inhaler as it has helped in the past with her breathing and this is ordered in the emergency department. Resources for PCP f/u will be given with d/c paperwork.  Shaaron Adler, [place] 03/24/12 [phone]

## 2012-04-17 ENCOUNTER — Emergency Department (HOSPITAL_COMMUNITY): Payer: Self-pay

## 2012-04-17 ENCOUNTER — Encounter (HOSPITAL_COMMUNITY): Payer: Self-pay | Admitting: Physical Medicine and Rehabilitation

## 2012-04-17 ENCOUNTER — Observation Stay (HOSPITAL_COMMUNITY)
Admission: EM | Admit: 2012-04-17 | Discharge: 2012-04-20 | Disposition: A | Discharge status: Home / Self Care | Payer: MEDICAID | Attending: Family Medicine | Admitting: Family Medicine

## 2012-04-17 DIAGNOSIS — Y9210 Unspecified residential institution as the place of occurrence of the external cause: Secondary | ICD-10-CM | POA: Insufficient documentation

## 2012-04-17 DIAGNOSIS — E119 Type 2 diabetes mellitus without complications: Secondary | ICD-10-CM

## 2012-04-17 DIAGNOSIS — I10 Essential (primary) hypertension: Secondary | ICD-10-CM

## 2012-04-17 DIAGNOSIS — M545 Low back pain, unspecified: Secondary | ICD-10-CM

## 2012-04-17 DIAGNOSIS — Z8249 Family history of ischemic heart disease and other diseases of the circulatory system: Secondary | ICD-10-CM | POA: Insufficient documentation

## 2012-04-17 DIAGNOSIS — E1151 Type 2 diabetes mellitus with diabetic peripheral angiopathy without gangrene: Secondary | ICD-10-CM

## 2012-04-17 DIAGNOSIS — Z720 Tobacco use: Secondary | ICD-10-CM

## 2012-04-17 DIAGNOSIS — E785 Hyperlipidemia, unspecified: Secondary | ICD-10-CM

## 2012-04-17 DIAGNOSIS — R079 Chest pain, unspecified: Principal | ICD-10-CM

## 2012-04-17 DIAGNOSIS — F17200 Nicotine dependence, unspecified, uncomplicated: Secondary | ICD-10-CM

## 2012-04-17 DIAGNOSIS — R0789 Other chest pain: Secondary | ICD-10-CM

## 2012-04-17 DIAGNOSIS — G43909 Migraine, unspecified, not intractable, without status migrainosus: Secondary | ICD-10-CM

## 2012-04-17 DIAGNOSIS — R739 Hyperglycemia, unspecified: Secondary | ICD-10-CM

## 2012-04-17 DIAGNOSIS — R0602 Shortness of breath: Secondary | ICD-10-CM | POA: Insufficient documentation

## 2012-04-17 DIAGNOSIS — G8929 Other chronic pain: Secondary | ICD-10-CM

## 2012-04-17 DIAGNOSIS — T463X5A Adverse effect of coronary vasodilators, initial encounter: Secondary | ICD-10-CM | POA: Insufficient documentation

## 2012-04-17 DIAGNOSIS — R112 Nausea with vomiting, unspecified: Secondary | ICD-10-CM | POA: Insufficient documentation

## 2012-04-17 DIAGNOSIS — Z23 Encounter for immunization: Secondary | ICD-10-CM | POA: Insufficient documentation

## 2012-04-17 DIAGNOSIS — R51 Headache: Secondary | ICD-10-CM | POA: Insufficient documentation

## 2012-04-17 HISTORY — DX: Type 2 diabetes mellitus without complications: E11.9

## 2012-04-17 HISTORY — DX: Major depressive disorder, single episode, unspecified: F32.9

## 2012-04-17 HISTORY — DX: Headache: R51

## 2012-04-17 HISTORY — DX: Depression, unspecified: F32.A

## 2012-04-17 HISTORY — DX: Low back pain, unspecified: M54.50

## 2012-04-17 HISTORY — DX: Migraine, unspecified, not intractable, without status migrainosus: G43.909

## 2012-04-17 HISTORY — DX: Pure hypercholesterolemia, unspecified: E78.00

## 2012-04-17 HISTORY — DX: Other chronic pain: G89.29

## 2012-04-17 HISTORY — DX: Anxiety disorder, unspecified: F41.9

## 2012-04-17 HISTORY — DX: Low back pain: M54.5

## 2012-04-17 LAB — CBC WITH DIFFERENTIAL/PLATELET
Basophils Absolute: 0.1 10*3/uL (ref 0.0–0.1)
Basophils Relative: 1 % (ref 0–1)
Eosinophils Absolute: 0.4 10*3/uL (ref 0.0–0.7)
MCH: 23.5 pg — ABNORMAL LOW (ref 26.0–34.0)
MCHC: 32.2 g/dL (ref 30.0–36.0)
Neutro Abs: 4.9 10*3/uL (ref 1.7–7.7)
Neutrophils Relative %: 49 % (ref 43–77)
RDW: 15.3 % (ref 11.5–15.5)

## 2012-04-17 LAB — COMPREHENSIVE METABOLIC PANEL
Albumin: 3.2 g/dL — ABNORMAL LOW (ref 3.5–5.2)
BUN: 9 mg/dL (ref 6–23)
CO2: 29 mEq/L (ref 19–32)
Chloride: 100 mEq/L (ref 96–112)
Creatinine, Ser: 0.98 mg/dL (ref 0.50–1.10)
GFR calc Af Amer: 83 mL/min — ABNORMAL LOW (ref >90)
GFR calc non Af Amer: 72 mL/min — ABNORMAL LOW (ref >90)
Glucose, Bld: 312 mg/dL — ABNORMAL HIGH (ref 70–99)
Total Bilirubin: 0.2 mg/dL — ABNORMAL LOW (ref 0.3–1.2)

## 2012-04-17 LAB — CREATININE, SERUM
GFR calc Af Amer: 90 mL/min — ABNORMAL LOW (ref >90)
GFR calc non Af Amer: 77 mL/min — ABNORMAL LOW (ref >90)

## 2012-04-17 LAB — URINALYSIS, ROUTINE W REFLEX MICROSCOPIC
Bilirubin Urine: NEGATIVE
Ketones, ur: NEGATIVE mg/dL
Leukocytes, UA: NEGATIVE
Nitrite: NEGATIVE
Protein, ur: 100 mg/dL — AB
Urobilinogen, UA: 1 mg/dL (ref 0.0–1.0)

## 2012-04-17 LAB — D-DIMER, QUANTITATIVE: D-Dimer, Quant: 0.28 ug/mL-FEU (ref 0.00–0.48)

## 2012-04-17 LAB — GLUCOSE, CAPILLARY: Glucose-Capillary: 333 mg/dL — ABNORMAL HIGH (ref 70–99)

## 2012-04-17 LAB — POCT I-STAT TROPONIN I: Troponin i, poc: 0 ng/mL (ref 0.00–0.08)

## 2012-04-17 LAB — CBC
HCT: 38.5 % (ref 36.0–46.0)
Hemoglobin: 12.2 g/dL (ref 12.0–15.0)
MCHC: 31.7 g/dL (ref 30.0–36.0)
MCV: 72.8 fL — ABNORMAL LOW (ref 78.0–100.0)
WBC: 12.8 10*3/uL — ABNORMAL HIGH (ref 4.0–10.5)

## 2012-04-17 LAB — CARDIAC PANEL(CRET KIN+CKTOT+MB+TROPI)
CK, MB: 2.3 ng/mL (ref 0.3–4.0)
Relative Index: 1.5 (ref 0.0–2.5)
Total CK: 151 U/L (ref 7–177)
Total CK: 134 U/L (ref 7–177)

## 2012-04-17 LAB — URINE MICROSCOPIC-ADD ON

## 2012-04-17 MED ORDER — ACETAMINOPHEN 325 MG PO TABS
650.0000 mg | ORAL_TABLET | Freq: Once | ORAL | Status: AC
  Administered 2012-04-17: 650 mg via ORAL

## 2012-04-17 MED ORDER — HYDROCHLOROTHIAZIDE 25 MG PO TABS
25.0000 mg | ORAL_TABLET | Freq: Every day | ORAL | Status: DC
  Administered 2012-04-17 – 2012-04-20 (×4): 25 mg via ORAL
  Filled 2012-04-17 (×4): qty 1

## 2012-04-17 MED ORDER — INSULIN ASPART 100 UNIT/ML ~~LOC~~ SOLN
0.0000 [IU] | Freq: Three times a day (TID) | SUBCUTANEOUS | Status: DC
  Administered 2012-04-18: 3 [IU] via SUBCUTANEOUS
  Administered 2012-04-18 – 2012-04-19 (×2): 5 [IU] via SUBCUTANEOUS
  Administered 2012-04-19: 8 [IU] via SUBCUTANEOUS
  Administered 2012-04-20: 5 [IU] via SUBCUTANEOUS
  Administered 2012-04-20: 2 [IU] via SUBCUTANEOUS

## 2012-04-17 MED ORDER — LABETALOL HCL 5 MG/ML IV SOLN
INTRAVENOUS | Status: AC
  Administered 2012-04-17: 20 mg via INTRAVENOUS
  Filled 2012-04-17: qty 4

## 2012-04-17 MED ORDER — SODIUM CHLORIDE 0.9 % IV SOLN
INTRAVENOUS | Status: DC
  Administered 2012-04-17 – 2012-04-19 (×2): via INTRAVENOUS

## 2012-04-17 MED ORDER — PNEUMOCOCCAL VAC POLYVALENT 25 MCG/0.5ML IJ INJ
0.5000 mL | INJECTION | INTRAMUSCULAR | Status: AC
  Administered 2012-04-18: 0.5 mL via INTRAMUSCULAR
  Filled 2012-04-17: qty 0.5

## 2012-04-17 MED ORDER — NITROGLYCERIN 0.4 MG SL SUBL
0.4000 mg | SUBLINGUAL_TABLET | SUBLINGUAL | Status: DC | PRN
  Administered 2012-04-17 (×2): 0.4 mg via SUBLINGUAL
  Filled 2012-04-17: qty 25

## 2012-04-17 MED ORDER — ATORVASTATIN CALCIUM 20 MG PO TABS
20.0000 mg | ORAL_TABLET | Freq: Every day | ORAL | Status: DC
  Administered 2012-04-17 – 2012-04-20 (×4): 20 mg via ORAL
  Filled 2012-04-17 (×4): qty 1

## 2012-04-17 MED ORDER — LABETALOL HCL 5 MG/ML IV SOLN
20.0000 mg | Freq: Once | INTRAVENOUS | Status: AC
  Administered 2012-04-17: 20 mg via INTRAVENOUS

## 2012-04-17 MED ORDER — NICOTINE 7 MG/24HR TD PT24
7.0000 mg | MEDICATED_PATCH | Freq: Every day | TRANSDERMAL | Status: DC | PRN
  Filled 2012-04-17: qty 1

## 2012-04-17 MED ORDER — METOPROLOL TARTRATE 25 MG PO TABS
25.0000 mg | ORAL_TABLET | Freq: Two times a day (BID) | ORAL | Status: DC
  Administered 2012-04-17 – 2012-04-20 (×6): 25 mg via ORAL
  Filled 2012-04-17 (×7): qty 1

## 2012-04-17 MED ORDER — INSULIN ASPART 100 UNIT/ML ~~LOC~~ SOLN
0.0000 [IU] | Freq: Every day | SUBCUTANEOUS | Status: DC
  Administered 2012-04-17 – 2012-04-18 (×2): 4 [IU] via SUBCUTANEOUS
  Administered 2012-04-19: 3 [IU] via SUBCUTANEOUS

## 2012-04-17 MED ORDER — NITROGLYCERIN 2 % TD OINT
0.5000 [in_us] | TOPICAL_OINTMENT | Freq: Four times a day (QID) | TRANSDERMAL | Status: DC
  Administered 2012-04-17 – 2012-04-20 (×8): 0.5 [in_us] via TOPICAL
  Filled 2012-04-17 (×2): qty 30

## 2012-04-17 MED ORDER — ENOXAPARIN SODIUM 40 MG/0.4ML ~~LOC~~ SOLN
40.0000 mg | SUBCUTANEOUS | Status: DC
  Administered 2012-04-17 – 2012-04-19 (×3): 40 mg via SUBCUTANEOUS
  Filled 2012-04-17 (×4): qty 0.4

## 2012-04-17 MED ORDER — LISINOPRIL 10 MG PO TABS
10.0000 mg | ORAL_TABLET | Freq: Two times a day (BID) | ORAL | Status: DC
  Administered 2012-04-18 – 2012-04-20 (×6): 10 mg via ORAL
  Filled 2012-04-17 (×8): qty 1

## 2012-04-17 MED ORDER — LISINOPRIL 10 MG PO TABS
10.0000 mg | ORAL_TABLET | Freq: Once | ORAL | Status: AC
  Administered 2012-04-17: 10 mg via ORAL
  Filled 2012-04-17: qty 1

## 2012-04-17 MED ORDER — ONDANSETRON HCL 4 MG/2ML IJ SOLN: 4.0000 mg | Freq: Three times a day (TID) | INTRAMUSCULAR | Status: DC | PRN

## 2012-04-17 MED ORDER — HYDRALAZINE HCL 20 MG/ML IJ SOLN
5.0000 mg | INTRAMUSCULAR | Status: DC | PRN
  Administered 2012-04-17: 5 mg via INTRAVENOUS
  Filled 2012-04-17 (×2): qty 0.25

## 2012-04-17 MED ORDER — ACETAMINOPHEN 325 MG PO TABS
650.0000 mg | ORAL_TABLET | ORAL | Status: DC | PRN
  Administered 2012-04-17: 650 mg via ORAL
  Filled 2012-04-17 (×2): qty 2

## 2012-04-17 MED ORDER — ASPIRIN EC 81 MG PO TBEC
81.0000 mg | DELAYED_RELEASE_TABLET | Freq: Every day | ORAL | Status: DC
  Administered 2012-04-18 – 2012-04-20 (×3): 81 mg via ORAL
  Filled 2012-04-17 (×3): qty 1

## 2012-04-17 MED ORDER — ALBUTEROL SULFATE (5 MG/ML) 0.5% IN NEBU: 2.5000 mg | INHALATION_SOLUTION | RESPIRATORY_TRACT | Status: AC | PRN

## 2012-04-17 MED ORDER — ALBUTEROL SULFATE HFA 108 (90 BASE) MCG/ACT IN AERS: 2.0000 | INHALATION_SPRAY | Freq: Four times a day (QID) | RESPIRATORY_TRACT | Status: DC | PRN

## 2012-04-17 MED ORDER — LISINOPRIL 10 MG PO TABS: 10.0000 mg | ORAL_TABLET | Freq: Every day | ORAL | Status: DC

## 2012-04-17 MED ORDER — ONDANSETRON HCL 4 MG/2ML IJ SOLN
4.0000 mg | Freq: Four times a day (QID) | INTRAMUSCULAR | Status: DC | PRN
  Administered 2012-04-19: 4 mg via INTRAVENOUS
  Filled 2012-04-17: qty 2

## 2012-04-17 NOTE — H&P (Signed)
History and Physical Note Family Medicine Teaching Service Langford Carias M. Mikel Cella, MD Service Pager: [phone]  Diamond Rice is an 41 y.o. female.    Chief Complaint: chest pain  HPI: Patient is a 41 yo woman with PMH of currently untreated HTN, DM and HLD as well as tobacco use who presented to the ED for acute onset left sided chest pain at work today around noon. Patient states she was sitting at work today and experienced the pain at her left sternal border. It did not radiate. It was associated with SOB but no lightheadedness, nausea or diaphoresis. She described the pain as dull and almost like "someone had punched her chest." Patient states she has had "little chest pains off and on for a while" but does nothing for the pain. The pain today was more severe and did not resolve on its own therefore EMS was called and she received nitro x2 en route which relieved the pain but she continued to feel a heaviness in her chest. Patient states she had pain like this "a few years ago" while living in [place]. At that time she was seen at [facility] and had a cath. Per her report, the cath showed a 40% blockage on the right but it was not a major artery so no intervention took place. After leaving the hospital, patient did not take any medications. She moved to [place] 1-2 years ago and has not established a PCP here for insurance reasons. She has not taken any medications for her HTN, DM or HLD in many years. Patient also states that her mother died suddenly from an MI at age 31, her father had a stroke as well as 2 of her brothers had strokes. Currently, she states she has a headache (s/p nitro), a little bit of chest pain, and shortness of breath. Denies changes in vision, abd pain, nausea, leg edema.  Past Medical History  Diagnosis Date  . Diabetes mellitus   . Hypertension     Past Surgical History  Procedure Date  . Cardiac catheterization    Family History: Mother- died MI age 15 Father- died  CVA age 75 Brothers x2- recent CVA  Social History: Lives with aunt. Smokes 2 cigarettes daily. Works at [employer]. Drinks EtOH on occasion.  Allergies:  Allergies  Allergen Reactions  . Morphine And Related Hives   Home meds: None  Results for orders placed during the hospital encounter of 04/17/12 (from the past 48 hour(s))  COMPREHENSIVE METABOLIC PANEL     Status: Abnormal   Collection Time   04/17/12  1:38 PM      Component Value Range Comment   Sodium 140  135 - 145 mEq/L    Potassium 3.3 (*) 3.5 - 5.1 mEq/L    Chloride 100  96 - 112 mEq/L    CO2 29  19 - 32 mEq/L    Glucose, Bld 312 (*) 70 - 99 mg/dL    BUN 9  6 - 23 mg/dL    Creatinine, Ser 4.54  0.50 - 1.10 mg/dL    Calcium 9.9  8.4 - 09.8 mg/dL    Total Protein 7.6  6.0 - 8.3 g/dL    Albumin 3.2 (*) 3.5 - 5.2 g/dL    AST 23  0 - 37 U/L    ALT 13  0 - 35 U/L    Alkaline Phosphatase 72  39 - 117 U/L    Total Bilirubin 0.2 (*) 0.3 - 1.2 mg/dL  GFR calc non Af Amer 72 (*) >90 mL/min    GFR calc Af Amer 83 (*) >90 mL/min   D-DIMER, QUANTITATIVE     Status: Normal   Collection Time   04/17/12  1:38 PM      Component Value Range Comment   D-Dimer, Quant 0.28  0.00 - 0.48 ug/mL-FEU   CBC WITH DIFFERENTIAL     Status: Abnormal   Collection Time   04/17/12  2:20 PM      Component Value Range Comment   WBC 10.1  4.0 - 10.5 K/uL    RBC 4.89  3.87 - 5.11 MIL/uL    Hemoglobin 11.5 (*) 12.0 - 15.0 g/dL    HCT 84.6 (*) 96.2 - 46.0 %    MCV 73.0 (*) 78.0 - 100.0 fL    MCH 23.5 (*) 26.0 - 34.0 pg    MCHC 32.2  30.0 - 36.0 g/dL    RDW 95.2  84.1 - 32.4 %    Platelets 307  150 - 400 K/uL    Neutrophils Relative 49  43 - 77 %    Neutro Abs 4.9  1.7 - 7.7 K/uL    Lymphocytes Relative 40  12 - 46 %    Lymphs Abs 4.0  0.7 - 4.0 K/uL    Monocytes Relative 7  3 - 12 %    Monocytes Absolute 0.8  0.1 - 1.0 K/uL    Eosinophils Relative 4  0 - 5 %    Eosinophils Absolute 0.4  0.0 - 0.7 K/uL    Basophils Relative 1  0 - 1 %     Basophils Absolute 0.1  0.0 - 0.1 K/uL   GLUCOSE, CAPILLARY     Status: Abnormal   Collection Time   04/17/12  2:23 PM      Component Value Range Comment   Glucose-Capillary 269 (*) 70 - 99 mg/dL   URINALYSIS, ROUTINE W REFLEX MICROSCOPIC     Status: Abnormal   Collection Time   04/17/12  2:25 PM      Component Value Range Comment   Color, Urine YELLOW  YELLOW    APPearance HAZY (*) CLEAR    Specific Gravity, Urine 1.030  1.005 - 1.030    pH 5.5  5.0 - 8.0    Glucose, UA >1000 (*) NEGATIVE mg/dL    Hgb urine dipstick NEGATIVE  NEGATIVE    Bilirubin Urine NEGATIVE  NEGATIVE    Ketones, ur NEGATIVE  NEGATIVE mg/dL    Protein, ur 401 (*) NEGATIVE mg/dL    Urobilinogen, UA 1.0  0.0 - 1.0 mg/dL    Nitrite NEGATIVE  NEGATIVE    Leukocytes, UA NEGATIVE  NEGATIVE   URINE MICROSCOPIC-ADD ON     Status: Abnormal   Collection Time   04/17/12  2:25 PM      Component Value Range Comment   Squamous Epithelial / LPF FEW (*) RARE    WBC, UA 3-6  <3 WBC/hpf    RBC / HPF 0-2  <3 RBC/hpf    Bacteria, UA FEW (*) RARE    Crystals CA OXALATE CRYSTALS (*) NEGATIVE   POCT I-STAT TROPONIN I     Status: Normal   Collection Time   04/17/12  2:37 PM      Component Value Range Comment   Troponin i, poc 0.00  0.00 - 0.08 ng/mL    Comment 3            POCT PREGNANCY, URINE  Status: Normal   Collection Time   04/17/12  2:52 PM      Component Value Range Comment   Preg Test, Ur NEGATIVE  NEGATIVE   GLUCOSE, CAPILLARY     Status: Abnormal   Collection Time   04/17/12  5:45 PM      Component Value Range Comment   Glucose-Capillary 198 (*) 70 - 99 mg/dL    Dg Chest 2 View  10/11/863  *RADIOLOGY REPORT*  Clinical Data: Chest pain, diabetes  CHEST - 2 VIEW  Comparison: Chest x-ray of 03/24/2012  Findings: The persistent linear opacity in the lingula probably represents scarring.  No active infiltrate or effusion is seen. There is mild cardiomegaly present. No acute bony abnormality is seen.  IMPRESSION:  Stable cardiomegaly and linear scarring in the lingula.  No active process.   Original Report Authenticated By: Juline Patch, M.D.    ROS See HPI above, otherwise negative  Blood pressure 169/118, pulse 76, temperature 99.1 F (37.3 C), temperature source Oral, resp. rate 15, weight 248 lb 14.4 oz (112.9 kg), last menstrual period 04/04/2012, SpO2 99.00%. Physical Exam  Constitutional: She is oriented to person, place, and time. She appears well-developed and well-nourished. No distress.       Obese  HENT:  Head: Normocephalic and atraumatic.  Mouth/Throat: Oropharynx is clear and moist.  Eyes: Pupils are equal, round, and reactive to light.  Neck: Normal range of motion.       Fullness noted in neck  Cardiovascular: Normal rate and regular rhythm.   Murmur (2+ systolic) heard. Respiratory: Effort normal. She has no wheezes.       Decreased breath sounds at bases. Pain with deep inspiration on right noted  GI: Soft. There is no tenderness.  Musculoskeletal: She exhibits no edema.  Lymphadenopathy:    She has no cervical adenopathy.  Neurological: She is alert and oriented to person, place, and time.  Skin: Skin is warm and dry. No rash noted.    Assessment/Plan 41 yo F with multiple risk factors presenting with acute onset chest pain  # Chest pain- Patient with atypical appearing chest pain with prior history of the same. She is also a smoker, obese, uncontrolled DM, untreated HTN, reported HLD and also has positive family history with her mother passing away at age 37 from MI. POCT troponin negative, no ST changes appreciated on EKG. - Admit to telemetry, attending Dr. Leveda Anna - Cycle cardiac enzymes x3 - EKG with chest pain, as well as AM EKG - Nitro as needed for chest pain - We will try to lower blood pressure with PO medications and closely monitor her vitals - Risk stratification labs including A1C, TSH, lipid panel - We will consult Cardiologist on call given her risk  factors, and stenosis noted on prior cath to see if a stress test or inpatient cath is recommended - Smoking cessation - ASA 81 mg - Albuterol as needed for symptomatic SOB, although we will use sparingly due to tachycardia - Zofran as needed for nausea  #HTN- No antihypertensives at home. BP 170's/110's on monitor during H&P - Given Labetalol in the ED. Will start Metoprolol 25mg  BID as well as Lisinopril 10mg  - Hydralazine prn for SBP >200 or DBP >110  # DM- Not on any medications at home. CBG >300 in ED - A1C with next lab draw - CBG checks qid - Moderate SSI + qhs coverage  # HLD- Patient reported, but unsure if she was on any medications  in the past at all - Fasting lipid panel in the morning - Lipitor 20mg  PO daily  # Smoking- Smokes 2 cigarettes per day, per her report - Counseled at admission about importance of smoking cessation - Smoking counselor to see tomorrow - Nicotine patches prn  # FEN/GI- Will keep NPO now until cardiology can see her. Will give heart healthy diet if no immediate procedure needed.  # PPx- Lovenox ppx dose  # Dispo- Pending further work up and clinical improvement. Patient has many risk factors and will need to be on good outpatient regimen with consistent follow up. Patient states she is motivated, and we will do what we can here to get her on track for health lifestyle. Patient updated on plan at admission.  Code: Full  Delsie Amador 04/17/2012, 5:58 PM

## 2012-04-17 NOTE — ED Notes (Signed)
Pt presents to department via [facility] for evaluation of L sided non radiating chest pain. Onset today @12pm . Also states SOB and nausea/vomiting. 4/10 pain upon arrival. Received 2 sublingual nitroglycerin. Pt is alert and oriented x4. BP 175/122, states she recently ran out of BP medication.

## 2012-04-17 NOTE — ED Provider Notes (Signed)
History     CSN: [id number]  Arrival date & time 04/17/12  1308   First MD Initiated Contact with Patient 04/17/12 1323      Chief Complaint  Patient presents with  . Chest Pain    (Consider location/radiation/quality/duration/timing/severity/associated sxs/prior treatment) Patient is a 41 y.o. female presenting with chest pain. The history is provided by the patient.  Chest Pain The chest pain began 3 - 5 hours ago. Chest pain occurs constantly. The chest pain is unchanged. The pain is associated with exertion. At its most intense, the pain is at 8/10. The severity of the pain is moderate. The quality of the pain is described as pressure-like. The pain does not radiate. Chest pain is worsened by stress. Primary symptoms include shortness of breath and cough. Pertinent negatives for primary symptoms include no fever, no palpitations, no abdominal pain, no nausea, no vomiting and no dizziness. She tried nothing for the symptoms.   Pt reports she has had cardiac catheterization few years ago in [place], was told her heart was enlarged, had a 40% blockage in one of the arteries, normal otherwse. Pts pain did improve with SL nitroglycerine. Family hx of MI in mother at age 74.   Past Medical History  Diagnosis Date  . Diabetes mellitus   . Hypertension     Past Surgical History  Procedure Date  . Cardiac catheterization     No family history on file.  History  Substance Use Topics  . Smoking status: Current Everyday Smoker  . Smokeless tobacco: Not on file  . Alcohol Use: Yes    OB History    Grav Para Term Preterm Abortions TAB SAB Ect Mult Living                  Review of Systems  Constitutional: Negative for fever and chills.  HENT: Negative for neck pain and neck stiffness.   Respiratory: Positive for cough, chest tightness and shortness of breath.   Cardiovascular: Positive for chest pain. Negative for palpitations and leg swelling.  Gastrointestinal: Negative  for nausea, vomiting and abdominal pain.  Genitourinary: Negative for dysuria and flank pain.  Musculoskeletal: Negative for back pain.  Skin: Negative.   Neurological: Negative for dizziness, syncope, light-headedness and headaches.  Hematological: Negative.     Allergies  Morphine and related  Home Medications   Current Outpatient Rx  Name Route Sig Dispense Refill  . ALBUTEROL SULFATE HFA 108 (90 BASE) MCG/ACT IN AERS Inhalation Inhale 2 puffs into the lungs every 6 (six) hours as needed. Shortness of breath    . IBUPROFEN 200 MG PO TABS Oral Take 600 mg by mouth every 6 (six) hours as needed. pain      BP 164/112  Pulse 84  Temp 97.5 F (36.4 C) (Oral)  Resp 14  SpO2 100%  LMP 04/04/2012  Physical Exam  Nursing note and vitals reviewed. Constitutional: She is oriented to person, place, and time. She appears well-developed and well-nourished. No distress.       Obese   HENT:  Head: Normocephalic and atraumatic.  Eyes: Conjunctivae are normal.  Neck: Neck supple.  Cardiovascular: Normal rate, regular rhythm and normal heart sounds.   Pulmonary/Chest: Effort normal and breath sounds normal. No respiratory distress. She has no wheezes. She has no rales.  Abdominal: Soft. Bowel sounds are normal. She exhibits no distension. There is no tenderness. There is no rebound.  Musculoskeletal: Normal range of motion. She exhibits no edema.  Neurological: She  is alert and oriented to person, place, and time.  Skin: Skin is warm and dry.  Psychiatric: She has a normal mood and affect.    ED Course  Procedures (including critical care time)   Date: 04/17/2012  Rate: 83  Rhythm: normal sinus rhythm  QRS Axis: normal  Intervals: normal  ST/T Wave abnormalities: normal and nonspecific T wave changes  Conduction Disutrbances:none  Narrative Interpretation:   Old EKG Reviewed: changes noted more prominent T wave inversions laterally  Pt with CP , SOB, onset today. Pt also is  hypertensive, off her meds for a year. Was see for the same here a week ago. At that time ruled out in ED. Concern for persistent angina. Will get labs, CXR, monitor.  Results for orders placed during the hospital encounter of 04/17/12  COMPREHENSIVE METABOLIC PANEL      Component Value Range   Sodium 140  135 - 145 mEq/L   Potassium 3.3 (*) 3.5 - 5.1 mEq/L   Chloride 100  96 - 112 mEq/L   CO2 29  19 - 32 mEq/L   Glucose, Bld 312 (*) 70 - 99 mg/dL   BUN 9  6 - 23 mg/dL   Creatinine, Ser 1.61  0.50 - 1.10 mg/dL   Calcium 9.9  8.4 - 09.6 mg/dL   Total Protein 7.6  6.0 - 8.3 g/dL   Albumin 3.2 (*) 3.5 - 5.2 g/dL   AST 23  0 - 37 U/L   ALT 13  0 - 35 U/L   Alkaline Phosphatase 72  39 - 117 U/L   Total Bilirubin 0.2 (*) 0.3 - 1.2 mg/dL   GFR calc non Af Amer 72 (*) >90 mL/min   GFR calc Af Amer 83 (*) >90 mL/min  D-DIMER, QUANTITATIVE      Component Value Range   D-Dimer, Quant 0.28  0.00 - 0.48 ug/mL-FEU  URINALYSIS, ROUTINE W REFLEX MICROSCOPIC      Component Value Range   Color, Urine YELLOW  YELLOW   APPearance HAZY (*) CLEAR   Specific Gravity, Urine 1.030  1.005 - 1.030   pH 5.5  5.0 - 8.0   Glucose, UA >1000 (*) NEGATIVE mg/dL   Hgb urine dipstick NEGATIVE  NEGATIVE   Bilirubin Urine NEGATIVE  NEGATIVE   Ketones, ur NEGATIVE  NEGATIVE mg/dL   Protein, ur 045 (*) NEGATIVE mg/dL   Urobilinogen, UA 1.0  0.0 - 1.0 mg/dL   Nitrite NEGATIVE  NEGATIVE   Leukocytes, UA NEGATIVE  NEGATIVE  CBC WITH DIFFERENTIAL      Component Value Range   WBC 10.1  4.0 - 10.5 K/uL   RBC 4.89  3.87 - 5.11 MIL/uL   Hemoglobin 11.5 (*) 12.0 - 15.0 g/dL   HCT 40.9 (*) 81.1 - 91.4 %   MCV 73.0 (*) 78.0 - 100.0 fL   MCH 23.5 (*) 26.0 - 34.0 pg   MCHC 32.2  30.0 - 36.0 g/dL   RDW 78.2  95.6 - 21.3 %   Platelets 307  150 - 400 K/uL   Neutrophils Relative 49  43 - 77 %   Neutro Abs 4.9  1.7 - 7.7 K/uL   Lymphocytes Relative 40  12 - 46 %   Lymphs Abs 4.0  0.7 - 4.0 K/uL   Monocytes Relative 7  3  - 12 %   Monocytes Absolute 0.8  0.1 - 1.0 K/uL   Eosinophils Relative 4  0 - 5 %   Eosinophils Absolute 0.4  0.0 - 0.7 K/uL   Basophils Relative 1  0 - 1 %   Basophils Absolute 0.1  0.0 - 0.1 K/uL  GLUCOSE, CAPILLARY      Component Value Range   Glucose-Capillary 269 (*) 70 - 99 mg/dL  POCT I-STAT TROPONIN I      Component Value Range   Troponin i, poc 0.00  0.00 - 0.08 ng/mL   Comment 3           POCT PREGNANCY, URINE      Component Value Range   Preg Test, Ur NEGATIVE  NEGATIVE  URINE MICROSCOPIC-ADD ON      Component Value Range   Squamous Epithelial / LPF FEW (*) RARE   WBC, UA 3-6  <3 WBC/hpf   RBC / HPF 0-2  <3 RBC/hpf   Bacteria, UA FEW (*) RARE   Crystals CA OXALATE CRYSTALS (*) NEGATIVE       Dg Chest 2 View  03/24/2012  *RADIOLOGY REPORT*  Clinical Data: Chest pain and back pain.  CHEST - 2 VIEW  Comparison: 03/30/2010  Findings: Two view of the chest demonstrate a new linear density in the left mid lung suggestive for atelectasis.  Heart size is upper limits of normal but stable.  Trachea is midline.  No evidence for pleural effusions.  IMPRESSION: New linear density in the left lung is suggestive for atelectasis.  Original Report Authenticated By: Richarda Overlie, M.D.   4:43 PM Labs and cxr unremarkable. Concerning for angina. Pt received ASA. Will admit for further testing, rule out, and possible stress test. Pt is unassigned, no PCP. Spoke with family medicine, will admit.      1. Chest pain   2. Hypertension   3. Hyperglycemia       MDM          Lottie Mussel, PA 04/17/12 1644

## 2012-04-17 NOTE — ED Provider Notes (Signed)
Medical screening examination/treatment/procedure(s) were performed by non-physician practitioner and as supervising physician I was immediately available for consultation/collaboration.   Carleene Cooper III, MD 04/17/12 1945

## 2012-04-17 NOTE — Consult Note (Signed)
Reason for Consult: Chest pain Referring Physician: Dr. Ladell Pier Diamond Rice is an 41 y.o. female.  HPI: Patient is 41 year old female with past medical history significant for mild-to-moderate coronary artery disease hypertension, non-insulin-dependent diabetes matters, hypercholesteremia, morbid obesity, history of tobacco abuse, strong family history of coronary artery disease mother died at age of 42 massive MI, came to the ER by EMS complaining off left-sided localized chest pain described as pressure heaviness grade 10 over 10 associated with shortness of breath while at work patient denies any palpitation lightheadedness or syncope denies PND orthopnea leg swelling denies the history of exertional chest pain states had similar chest pain approximately 2 years ago while in [place] had cardiac catheterization which showed approximately 40% stenosis in right coronary artery. And was treated medically. Patient states she stopped all her medications. Patient received 2 sublingual nitroglycerin by EMS with relief of chest pain. Patient denies any relation of chest pain to food breathing or movement.  Past Medical History  Diagnosis Date  . Diabetes mellitus   . Hypertension     Past Surgical History  Procedure Date  . Cardiac catheterization     No family history on file.  Social History:  reports that she has been smoking.  She does not have any smokeless tobacco history on file. She reports that she drinks alcohol. She reports that she does not use illicit drugs.  Allergies:  Allergies  Allergen Reactions  . Morphine And Related Hives    Medications: I have reviewed the patient's current medications.  Results for orders placed during the hospital encounter of 04/17/12 (from the past 48 hour(s))  COMPREHENSIVE METABOLIC PANEL     Status: Abnormal   Collection Time   04/17/12  1:38 PM      Component Value Range Comment   Sodium 140  135 - 145 mEq/L    Potassium 3.3 (*) 3.5 -  5.1 mEq/L    Chloride 100  96 - 112 mEq/L    CO2 29  19 - 32 mEq/L    Glucose, Bld 312 (*) 70 - 99 mg/dL    BUN 9  6 - 23 mg/dL    Creatinine, Ser 1.61  0.50 - 1.10 mg/dL    Calcium 9.9  8.4 - 09.6 mg/dL    Total Protein 7.6  6.0 - 8.3 g/dL    Albumin 3.2 (*) 3.5 - 5.2 g/dL    AST 23  0 - 37 U/L    ALT 13  0 - 35 U/L    Alkaline Phosphatase 72  39 - 117 U/L    Total Bilirubin 0.2 (*) 0.3 - 1.2 mg/dL    GFR calc non Af Amer 72 (*) >90 mL/min    GFR calc Af Amer 83 (*) >90 mL/min   D-DIMER, QUANTITATIVE     Status: Normal   Collection Time   04/17/12  1:38 PM      Component Value Range Comment   D-Dimer, Quant 0.28  0.00 - 0.48 ug/mL-FEU   CBC WITH DIFFERENTIAL     Status: Abnormal   Collection Time   04/17/12  2:20 PM      Component Value Range Comment   WBC 10.1  4.0 - 10.5 K/uL    RBC 4.89  3.87 - 5.11 MIL/uL    Hemoglobin 11.5 (*) 12.0 - 15.0 g/dL    HCT 04.5 (*) 40.9 - 46.0 %    MCV 73.0 (*) 78.0 - 100.0 fL    MCH 23.5 (*)  26.0 - 34.0 pg    MCHC 32.2  30.0 - 36.0 g/dL    RDW 45.4  09.8 - 11.9 %    Platelets 307  150 - 400 K/uL    Neutrophils Relative 49  43 - 77 %    Neutro Abs 4.9  1.7 - 7.7 K/uL    Lymphocytes Relative 40  12 - 46 %    Lymphs Abs 4.0  0.7 - 4.0 K/uL    Monocytes Relative 7  3 - 12 %    Monocytes Absolute 0.8  0.1 - 1.0 K/uL    Eosinophils Relative 4  0 - 5 %    Eosinophils Absolute 0.4  0.0 - 0.7 K/uL    Basophils Relative 1  0 - 1 %    Basophils Absolute 0.1  0.0 - 0.1 K/uL   GLUCOSE, CAPILLARY     Status: Abnormal   Collection Time   04/17/12  2:23 PM      Component Value Range Comment   Glucose-Capillary 269 (*) 70 - 99 mg/dL   URINALYSIS, ROUTINE W REFLEX MICROSCOPIC     Status: Abnormal   Collection Time   04/17/12  2:25 PM      Component Value Range Comment   Color, Urine YELLOW  YELLOW    APPearance HAZY (*) CLEAR    Specific Gravity, Urine 1.030  1.005 - 1.030    pH 5.5  5.0 - 8.0    Glucose, UA >1000 (*) NEGATIVE mg/dL    Hgb urine  dipstick NEGATIVE  NEGATIVE    Bilirubin Urine NEGATIVE  NEGATIVE    Ketones, ur NEGATIVE  NEGATIVE mg/dL    Protein, ur 147 (*) NEGATIVE mg/dL    Urobilinogen, UA 1.0  0.0 - 1.0 mg/dL    Nitrite NEGATIVE  NEGATIVE    Leukocytes, UA NEGATIVE  NEGATIVE   URINE MICROSCOPIC-ADD ON     Status: Abnormal   Collection Time   04/17/12  2:25 PM      Component Value Range Comment   Squamous Epithelial / LPF FEW (*) RARE    WBC, UA 3-6  <3 WBC/hpf    RBC / HPF 0-2  <3 RBC/hpf    Bacteria, UA FEW (*) RARE    Crystals CA OXALATE CRYSTALS (*) NEGATIVE   POCT I-STAT TROPONIN I     Status: Normal   Collection Time   04/17/12  2:37 PM      Component Value Range Comment   Troponin i, poc 0.00  0.00 - 0.08 ng/mL    Comment 3            POCT PREGNANCY, URINE     Status: Normal   Collection Time   04/17/12  2:52 PM      Component Value Range Comment   Preg Test, Ur NEGATIVE  NEGATIVE   GLUCOSE, CAPILLARY     Status: Abnormal   Collection Time   04/17/12  5:45 PM      Component Value Range Comment   Glucose-Capillary 198 (*) 70 - 99 mg/dL   MAGNESIUM     Status: Normal   Collection Time   04/17/12  5:56 PM      Component Value Range Comment   Magnesium 2.0  1.5 - 2.5 mg/dL   CBC     Status: Abnormal   Collection Time   04/17/12  5:56 PM      Component Value Range Comment   WBC 12.8 (*) 4.0 - 10.5 K/uL  RBC 5.29 (*) 3.87 - 5.11 MIL/uL    Hemoglobin 12.2  12.0 - 15.0 g/dL    HCT 11.9  14.7 - 82.9 %    MCV 72.8 (*) 78.0 - 100.0 fL    MCH 23.1 (*) 26.0 - 34.0 pg    MCHC 31.7  30.0 - 36.0 g/dL    RDW 56.2  13.0 - 86.5 %    Platelets 320  150 - 400 K/uL   CREATININE, SERUM     Status: Abnormal   Collection Time   04/17/12  5:56 PM      Component Value Range Comment   Creatinine, Ser 0.92  0.50 - 1.10 mg/dL    GFR calc non Af Amer 77 (*) >90 mL/min    GFR calc Af Amer 90 (*) >90 mL/min   CARDIAC PANEL(CRET KIN+CKTOT+MB+TROPI)     Status: Normal   Collection Time   04/17/12  6:00 PM       Component Value Range Comment   Total CK 151  7 - 177 U/L    CK, MB 2.3  0.3 - 4.0 ng/mL    Troponin I <0.30  <0.30 ng/mL    Relative Index 1.5  0.0 - 2.5     Dg Chest 2 View  04/17/2012  *RADIOLOGY REPORT*  Clinical Data: Chest pain, diabetes  CHEST - 2 VIEW  Comparison: Chest x-ray of 03/24/2012  Findings: The persistent linear opacity in the lingula probably represents scarring.  No active infiltrate or effusion is seen. There is mild cardiomegaly present. No acute bony abnormality is seen.  IMPRESSION: Stable cardiomegaly and linear scarring in the lingula.  No active process.   Original Report Authenticated By: Juline Patch, M.D.     Review of Systems  Constitutional: Negative for fever and chills.  HENT: Negative for hearing loss.   Eyes: Negative for blurred vision and double vision.  Respiratory: Positive for shortness of breath. Negative for cough, hemoptysis and sputum production.   Cardiovascular: Positive for chest pain. Negative for palpitations, orthopnea and claudication.  Gastrointestinal: Negative for heartburn, nausea and vomiting.  Neurological: Negative for dizziness and headaches.   Blood pressure 169/118, pulse 76, temperature 99.1 F (37.3 C), temperature source Oral, resp. rate 15, weight 112.9 kg (248 lb 14.4 oz), last menstrual period 04/04/2012, SpO2 99.00%. Physical Exam  Constitutional: She is oriented to person, place, and time. She appears well-developed and well-nourished.  HENT:  Head: Normocephalic and atraumatic.  Eyes: Conjunctivae are normal. Pupils are equal, round, and reactive to light. Left eye exhibits no discharge.  Neck: Normal range of motion. Neck supple. No JVD present. No tracheal deviation present. No thyromegaly present.  Cardiovascular: Normal rate, regular rhythm, normal heart sounds and intact distal pulses.  Exam reveals no friction rub.   No murmur heard. Respiratory: Breath sounds normal. No respiratory distress. She has no  wheezes. She has no rales.  GI: Soft. Bowel sounds are normal. She exhibits no distension. There is no tenderness. There is no rebound.  Musculoskeletal: She exhibits no edema and no tenderness.  Neurological: She is alert and oriented to person, place, and time.    Assessment/Plan: Atypical chest pain with some features worrisome for angina rule out MI Mild to moderate coronary artery disease in the past Uncontrolled hypertension secondary to noncompliance to medication Non-insulin-dependent diabetes mellitus Hypercholesteremia Morbid obesity Strong family history of coronary artery disease Tobacco abuse Plan Agree with present management Will schedule for nuclear stress test in a.m. to further  risk stratify Add low-dose nitrates Increase ACE inhibitors as per orders Diamond Rice N 04/17/2012, 7:15 PM

## 2012-04-17 NOTE — ED Provider Notes (Signed)
1:38 PM  Date: 04/17/2012  Rate: 83  Rhythm: normal sinus rhythm  QRS Axis: left  Intervals: normal QRS:  Left atrial abnormality, left ventricular hypertrophy, Q waves in precordial leads, ? Due to LVH.  ST/T Wave abnormalities: Inverted T waves in anterior and lateral precordial leads.  Conduction Disutrbances:none  Narrative Interpretation: Abnormal EKG.  Old EKG Reviewed: unchanged    Carleene Cooper III, MD 04/17/12 [phone]

## 2012-04-18 ENCOUNTER — Observation Stay (HOSPITAL_COMMUNITY): Payer: MEDICAID

## 2012-04-18 LAB — COMPREHENSIVE METABOLIC PANEL
ALT: 12 U/L (ref 0–35)
Alkaline Phosphatase: 69 U/L (ref 39–117)
BUN: 11 mg/dL (ref 6–23)
CO2: 28 mEq/L (ref 19–32)
GFR calc Af Amer: >90 mL/min (ref >90)
GFR calc non Af Amer: 78 mL/min — ABNORMAL LOW (ref >90)
Glucose, Bld: 237 mg/dL — ABNORMAL HIGH (ref 70–99)
Potassium: 3.2 mEq/L — ABNORMAL LOW (ref 3.5–5.1)
Sodium: 137 mEq/L (ref 135–145)
Total Bilirubin: 0.3 mg/dL (ref 0.3–1.2)

## 2012-04-18 LAB — LIPID PANEL: LDL Cholesterol: 88 mg/dL (ref 0–99)

## 2012-04-18 LAB — CARDIAC PANEL(CRET KIN+CKTOT+MB+TROPI)
CK, MB: 2.1 ng/mL (ref 0.3–4.0)
Relative Index: 1.8 (ref 0.0–2.5)
Total CK: 120 U/L (ref 7–177)

## 2012-04-18 LAB — HEMOGLOBIN A1C: Mean Plasma Glucose: 269 mg/dL — ABNORMAL HIGH (ref <117)

## 2012-04-18 LAB — GLUCOSE, CAPILLARY

## 2012-04-18 LAB — TSH: TSH: 0.887 u[IU]/mL (ref 0.350–4.500)

## 2012-04-18 LAB — CBC
HCT: 36.1 % (ref 36.0–46.0)
Hemoglobin: 11.6 g/dL — ABNORMAL LOW (ref 12.0–15.0)
RBC: 4.96 MIL/uL (ref 3.87–5.11)

## 2012-04-18 MED ORDER — INSULIN GLARGINE 100 UNIT/ML ~~LOC~~ SOLN
10.0000 [IU] | Freq: Every day | SUBCUTANEOUS | Status: DC
  Administered 2012-04-18 – 2012-04-19 (×2): 10 [IU] via SUBCUTANEOUS

## 2012-04-18 MED ORDER — POTASSIUM CHLORIDE CRYS ER 20 MEQ PO TBCR
40.0000 meq | EXTENDED_RELEASE_TABLET | Freq: Once | ORAL | Status: AC
  Administered 2012-04-18: 40 meq via ORAL
  Filled 2012-04-18: qty 2

## 2012-04-18 MED ORDER — OXYCODONE-ACETAMINOPHEN 5-325 MG PO TABS
1.0000 | ORAL_TABLET | Freq: Four times a day (QID) | ORAL | Status: DC | PRN
  Administered 2012-04-18 – 2012-04-20 (×7): 2 via ORAL
  Filled 2012-04-18 (×7): qty 2

## 2012-04-18 MED ORDER — LIVING WELL WITH DIABETES BOOK
Freq: Once | Status: AC
  Administered 2012-04-18: 17:00:00
  Filled 2012-04-18: qty 1

## 2012-04-18 NOTE — Progress Notes (Signed)
Seen and examined.  Discussed with Dr. Adriana Simas.  Agree with his management.  Appreciate cards help.  As per the admit note, we need to improve control of HBP, DM, Cholesterol, stay on ASA for CAD and quit smoking.  Stress test should help [place] know if we need to do anything further.

## 2012-04-18 NOTE — H&P (Signed)
Seen and examined in the ER on 8/21.  Discussed with Dr. Mikel Cella.  Agree with her management.  Briefly, 41 yo female with known CAD (non obstructive CAD per cath 2 years ago)  Presents with CP Pretest probability of CAD is 100%.  Also, she has known risk factors of HBP, DM, smoking and hypercholesterolemia, all untreated recently after the loss of her job.  Family history is scary positive.  Given risk factors, will involve cards early in the management of this patient.

## 2012-04-18 NOTE — Progress Notes (Signed)
Subjective:  Patient denies any chest pain or shortness of breath. Had resting portion of the nuclear scan today schedule for stress portion tomorrow. Complaints of headache secondary to nitroglycerin  Objective:  Vital Signs in the last 24 hours: Temp:  [98 F (36.7 C)-99.1 F (37.3 C)] 98.6 F (37 C) (08/22 1434) Pulse Rate:  [72-85] 72  (08/22 1434) Resp:  [16-20] 20  (08/22 1434) BP: (139-187)/(88-132) 166/98 mmHg (08/22 1434) SpO2:  [98 %-100 %] 100 % (08/22 1434) Weight:  [112.6 kg (248 lb 3.8 oz)-112.9 kg (248 lb 14.4 oz)] 112.6 kg (248 lb 3.8 oz) (08/22 1610)  Intake/Output from previous day: 08/21 0701 - 08/22 0700 In: 480 [P.O.:480] Out: 650 [Urine:650] Intake/Output from this shift: Total I/O In: -  Out: 300 [Urine:300]  Physical Exam: Neck: no adenopathy, no carotid bruit, no JVD and supple, symmetrical, trachea midline Lungs: clear to auscultation bilaterally Heart: regular rate and rhythm, S1, S2 normal, no murmur, click, rub or gallop Abdomen: soft, non-tender; bowel sounds normal; no masses,  no organomegaly Extremities: extremities normal, atraumatic, no cyanosis or edema  Lab Results:  [facility] 04/18/12 0625 04/17/12 1756  WBC 10.0 12.8*  HGB 11.6* 12.2  PLT 316 320    Basename 04/18/12 0625 04/17/12 1756 04/17/12 1338  NA 137 -- 140  K 3.2* -- 3.3*  CL 99 -- 100  CO2 28 -- 29  GLUCOSE 237* -- 312*  BUN 11 -- 9  CREATININE 0.91 0.92 --    Basename 04/18/12 0625 04/17/12 2255  TROPONINI <0.30 <0.30   Hepatic Function Panel  Basename 04/18/12 0625  PROT 7.2  ALBUMIN 3.1*  AST 16  ALT 12  ALKPHOS 69  BILITOT 0.3  BILIDIR --  IBILI --    Basename 04/18/12 0625  CHOL 166   No results found for this basename: PROTIME in the last 72 hours  Imaging: Imaging results have been reviewed and Dg Chest 2 View  04/17/2012  *RADIOLOGY REPORT*  Clinical Data: Chest pain, diabetes  CHEST - 2 VIEW  Comparison: Chest x-ray of 03/24/2012  Findings:  The persistent linear opacity in the lingula probably represents scarring.  No active infiltrate or effusion is seen. There is mild cardiomegaly present. No acute bony abnormality is seen.  IMPRESSION: Stable cardiomegaly and linear scarring in the lingula.  No active process.   Original Report Authenticated By: Juline Patch, M.D.     Cardiac Studies:  Assessment/Plan:  Atypical chest pain with some features worrisome for angina rule out MI  Mild to moderate coronary artery disease in the past  Uncontrolled hypertension secondary to noncompliance to medication  Non-insulin-dependent diabetes mellitus  Hypercholesteremia  Morbid obesity  Strong family history of coronary artery disease  Tobacco abuse  Plan Schedule for nuclear stress test tomorrow Discussed with patient regarding diet and lifestyle modification and exercise and weight loss  LOS: 1 day    Diamond Rice N 04/18/2012, 3:47 PM

## 2012-04-18 NOTE — Care Management Note (Signed)
    Page 1 of 1   04/20/2012     12:59:14 PM   CARE MANAGEMENT NOTE 04/20/2012  Patient:  Diamond Rice, Diamond Rice   Account Number:  [id number]  Date Initiated:  04/18/2012  Documentation initiated by:  SIMMONS,CRYSTAL  Subjective/Objective Assessment:   ADMITTED WITH CP; LIVES AT HOME WITH FRIEND- MELINDA; WAS [employer]- WORKS AS CNA; USES [facility] ON [address] FOR RX.     Action/Plan:   DISCHARGE PLANNING DISCUSSED AT BEDSIDE.   Anticipated DC Date:  04/19/2012   Anticipated DC Plan:  HOME/SELF CARE      DC Planning Services  CM consult  Medication Assistance      Choice offered to / List presented to:             Status of service:  Completed, signed off Medicare Important Message given?   (If response is "NO", the following Medicare IM given date fields will be blank) Date Medicare IM given:   Date Additional Medicare IM given:    Discharge Disposition:  HOME/SELF CARE  Per UR Regulation:  Reviewed for med. necessity/level of care/duration of stay  If discussed at Long Length of Stay Meetings, dates discussed:    Comments:  04/20/2012 1200 Spoke to pt and states she does not have any drug coverage or insurance. NCM explained [employer] meds assistance fund can be used once per year. 3 day Rx to main pharmacy to be filled. Provided pt with community resources such as [employer], [employer] representative and DSS, community discount card that may assist with out of pocket meds. Isidoro Donning RN CCM Case Mgmt phone [phone]  04/18/12  1045  CRYSTAL SIMMONS RN, BSN [phone] PT IS ELIGIBLE FOR [employer] FUND; MD- PLEASE WRITE SEPARATE RX FOR 3 DAYS SUPPLY OF MEDS AT DISCHARGE- TO BE SENT TO MAIN PHARMACY. PROVIDED PT WITH PCP LIST.  NCM WILL FOLLOW.

## 2012-04-18 NOTE — Progress Notes (Signed)
Inpatient Diabetes Program Recommendations  AACE/ADA: New Consensus Statement on Inpatient Glycemic Control (2013)  Target Ranges:  Prepandial:   less than 140 mg/dL      Peak postprandial:   less than 180 mg/dL (1-2 hours)      Critically ill patients:  140 - 180 mg/dL   Reason for Visit: B1Y=78.2% indicating average CBG's approximately 270 mg/dL prior to admit.  Patient states that she was diagnosed with diabetes 3 years ago.  At time of diagnosis she was put on metformin however has not been taking recently.  States that she has not been seeing MD due to lack of health insurance.  Discussed A1C with patient and need for follow-up with MD after discharge.  Note that patient was started on Lantus 10 units q HS.  Agree with start of insulin based on A1C, however concerned regarding patients ability to afford Lantus due to cost.  May consider NPH instead of Lantus as it can be purchased generic for around 26$.  Discussed purchase of generic meter with patient from [facility].  She seems motivated to learn.  Will order case management consult regarding medications and PCP.  Also will order diabetes teaching while in hospital.  Will also benefit from follow-up with nutritionists at [facility] practice.

## 2012-04-18 NOTE — Progress Notes (Signed)
Family Medicine Teaching Service Daily Progress Note Service Page: [phone]  Subjective:  Patient denies chest pain this am.  Does complain of headache and some SOB.  Objective: Temp:  [97.5 F (36.4 C)-99.1 F (37.3 C)] 98 F (36.7 C) (08/22 0317) Pulse Rate:  [74-85] 74  (08/22 0317) Resp:  [14-19] 19  (08/22 0317) BP: (139-187)/(88-132) 139/88 mmHg (08/22 0317) SpO2:  [98 %-100 %] 98 % (08/22 0317) Weight:  [248 lb 3.8 oz (112.6 kg)-248 lb 14.4 oz (112.9 kg)] 248 lb 3.8 oz (112.6 kg) (08/22 4540)  Exam: General: awake, alert. NAD. Cardiovascular: RRR. No murmurs, rubs, or gallops. Respiratory: CTAB. No rales, rhonchi, or wheeze. Abdomen: soft, nontender, nondistended. Extremities: no lower extremity edema.  I have reviewed the patient's medications, labs, imaging, and diagnostic testing.  Notable results are summarized below.  CBC BMET   Lab 04/18/12 0625 04/17/12 1756 04/17/12 1420  WBC 10.0 12.8* 10.1  HGB 11.6* 12.2 11.5*  HCT 36.1 38.5 35.7*  PLT 316 320 307    Lab 04/18/12 0625 04/17/12 1756 04/17/12 1338  NA 137 -- 140  K 3.2* -- 3.3*  CL 99 -- 100  CO2 28 -- 29  BUN 11 -- 9  CREATININE 0.91 0.92 0.98  GLUCOSE 237* -- 312*  CALCIUM 9.3 -- 9.9     Imaging/Diagnostic Tests:  Lipid Panel     Component Value Date/Time   CHOL 166 04/18/2012 0625   TRIG 151* 04/18/2012 0625   HDL 48 04/18/2012 0625   CHOLHDL 3.5 04/18/2012 0625   VLDL 30 04/18/2012 0625   LDLCALC 88 04/18/2012 0625   Cardiac Panel (last 3 results)  Basename 04/18/12 0625 04/17/12 2255 04/17/12 1800  CKTOTAL 120 134 151  CKMB 2.1 2.0 2.3  TROPONINI <0.30 <0.30 <0.30  RELINDX 1.8 1.5 1.5   TSH - 0.887  Lab Results  Component Value Date   HGBA1C 11.0* 04/17/2012    Asssessment/Plan: 41 year old female with a PMH of DM, HTN, HLD, and tobacco abuse who presents with chest pain.  # Chest pain-  Patient with atypical appearing chest pain with prior history of the same. She is also a  smoker, obese, uncontrolled DM, untreated HTN, reported HLD and also has positive family history with her mother passing away at age 78 from MI - chest pain currently resolved with nitropaste - Cardiac enzymes negative x 3  - Cardiology on board and we appreciate their help - Patient going for stress test today; patient has known CAD - will continue nitro, ASA, Metoprolol, Lipitor, and Lisinopril  #HTN - Continue Metoprolol, Lisinopril, HCTZ - Hydralazine prn  # DM - Diabetes uncontrolled , A1C 11.0 - CBG checks qac and qhs  - Moderate SSI + qhs coverage.  - Adding Lantus 10 U QHS  # HLD  - Continue Lipitor 20mg  PO daily   # Smoking - Counseled at admission about importance of smoking cessation  - Smoking counselor to see tomorrow  - Nicotine patches prn   # FEN/GI- Currently NPO. # PPx- Lovenox ppx dose  # Dispo- Pending further work up and clinical improvement. Patient has many risk factors and will need to be on good outpatient regimen with consistent follow up.  #CodeChana Bode, DO 04/18/2012, 9:24 AM

## 2012-04-19 ENCOUNTER — Observation Stay (HOSPITAL_COMMUNITY): Payer: MEDICAID

## 2012-04-19 LAB — BASIC METABOLIC PANEL
BUN: 12 mg/dL (ref 6–23)
CO2: 30 mEq/L (ref 19–32)
GFR calc non Af Amer: 73 mL/min — ABNORMAL LOW (ref >90)
Glucose, Bld: 207 mg/dL — ABNORMAL HIGH (ref 70–99)
Potassium: 3.7 mEq/L (ref 3.5–5.1)
Sodium: 136 mEq/L (ref 135–145)

## 2012-04-19 LAB — GLUCOSE, CAPILLARY
Glucose-Capillary: 218 mg/dL — ABNORMAL HIGH (ref 70–99)
Glucose-Capillary: 245 mg/dL — ABNORMAL HIGH (ref 70–99)
Glucose-Capillary: 265 mg/dL — ABNORMAL HIGH (ref 70–99)

## 2012-04-19 MED ORDER — GLUCOSE BLOOD VI STRP: ORAL_STRIP | Status: AC

## 2012-04-19 MED ORDER — ASPIRIN 81 MG PO TBEC: 81.0000 mg | DELAYED_RELEASE_TABLET | Freq: Every day | ORAL | Status: DC

## 2012-04-19 MED ORDER — TECHNETIUM TC 99M TETROFOSMIN IV KIT
30.0000 | PACK | Freq: Once | INTRAVENOUS | Status: AC | PRN
  Administered 2012-04-19: 30 via INTRAVENOUS

## 2012-04-19 MED ORDER — REGADENOSON 0.4 MG/5ML IV SOLN
INTRAVENOUS | Status: AC
  Filled 2012-04-19: qty 5

## 2012-04-19 MED ORDER — LISINOPRIL 10 MG PO TABS: 20.0000 mg | ORAL_TABLET | Freq: Every day | ORAL | Status: DC

## 2012-04-19 MED ORDER — RELION LANCETS STANDARD 21G MISC: 1.0000 | Freq: Two times a day (BID) | Status: DC

## 2012-04-19 MED ORDER — GLIMEPIRIDE 2 MG PO TABS: 2.0000 mg | ORAL_TABLET | Freq: Every day | ORAL | Status: DC

## 2012-04-19 MED ORDER — ATORVASTATIN CALCIUM 20 MG PO TABS: 20.0000 mg | ORAL_TABLET | Freq: Every day | ORAL | Status: DC

## 2012-04-19 MED ORDER — METOPROLOL TARTRATE 25 MG PO TABS: 25.0000 mg | ORAL_TABLET | Freq: Two times a day (BID) | ORAL | Status: DC

## 2012-04-19 MED ORDER — RELION PRIME MONITOR DEVI: 1.0000 | Freq: Two times a day (BID) | Status: DC

## 2012-04-19 MED ORDER — ONDANSETRON HCL 4 MG/2ML IJ SOLN
4.0000 mg | Freq: Once | INTRAMUSCULAR | Status: AC
  Administered 2012-04-19: 4 mg via INTRAVENOUS

## 2012-04-19 MED ORDER — HYDROCHLOROTHIAZIDE 25 MG PO TABS: 25.0000 mg | ORAL_TABLET | Freq: Every day | ORAL | Status: DC

## 2012-04-19 MED ORDER — REGADENOSON 0.4 MG/5ML IV SOLN
0.4000 mg | Freq: Once | INTRAVENOUS | Status: AC
  Administered 2012-04-19: 0.4 mg via INTRAVENOUS
  Filled 2012-04-19: qty 5

## 2012-04-19 MED ORDER — TECHNETIUM TC 99M TETROFOSMIN IV KIT: 30.0000 | PACK | Freq: Once | INTRAVENOUS | Status: AC | PRN

## 2012-04-19 MED ORDER — ONDANSETRON HCL 4 MG/2ML IJ SOLN
INTRAMUSCULAR | Status: AC
  Filled 2012-04-19: qty 2

## 2012-04-19 MED ORDER — METFORMIN HCL 500 MG PO TABS: 500.0000 mg | ORAL_TABLET | Freq: Two times a day (BID) | ORAL | Status: DC

## 2012-04-19 NOTE — Progress Notes (Signed)
Family Medicine Teaching Service Daily Progress Note Service Page: [phone]  Subjective:  Patient reports episode of nausea and emesis this morning which improved following IV Zofran. No chest pain this am.  Continues to report headache.  Objective: Temp:  [97.8 F (36.6 C)-98.6 F (37 C)] 97.8 F (36.6 C) (08/23 0425) Pulse Rate:  [68-89] 68  (08/23 0425) Resp:  [19-20] 19  (08/23 0425) BP: (133-166)/(78-98) 134/78 mmHg (08/23 0425) SpO2:  [98 %-100 %] 98 % (08/23 0425)  Exam: General: awake, alert. NAD. Cardiovascular: RRR. No murmurs, rubs, or gallops. Respiratory: CTAB. No rales, rhonchi, or wheeze. Abdomen: soft, nontender, nondistended. Extremities: no lower extremity edema.  I have reviewed the patient's medications, labs, imaging, and diagnostic testing.  Notable results are summarized below.  CBC BMET   Lab 04/18/12 0625 04/17/12 1756 04/17/12 1420  WBC 10.0 12.8* 10.1  HGB 11.6* 12.2 11.5*  HCT 36.1 38.5 35.7*  PLT 316 320 307    Lab 04/19/12 0555 04/18/12 0625 04/17/12 1756 04/17/12 1338  NA 136 137 -- 140  K 3.7 3.2* -- 3.3*  CL 99 99 -- 100  CO2 30 28 -- 29  BUN 12 11 -- 9  CREATININE 0.97 0.91 0.92 --  GLUCOSE 207* 237* -- 312*  CALCIUM 9.3 9.3 -- 9.9     Imaging/Diagnostic Tests:  Lipid Panel     Component Value Date/Time   CHOL 166 04/18/2012 0625   TRIG 151* 04/18/2012 0625   HDL 48 04/18/2012 0625   CHOLHDL 3.5 04/18/2012 0625   VLDL 30 04/18/2012 0625   LDLCALC 88 04/18/2012 0625   Cardiac Panel (last 3 results)  Basename 04/18/12 0625 04/17/12 2255 04/17/12 1800  CKTOTAL 120 134 151  CKMB 2.1 2.0 2.3  TROPONINI <0.30 <0.30 <0.30  RELINDX 1.8 1.5 1.5   TSH - 0.887  Lab Results  Component Value Date   HGBA1C 11.0* 04/17/2012    Asssessment/Plan: 41 year old female with a PMH of DM, HTN, HLD, and tobacco abuse who presents with chest pain.  # Chest pain-  Patient with atypical appearing chest pain with prior history of the same.  She is also a smoker, obese, uncontrolled DM, untreated HTN, reported HLD and also has positive family history with her mother passing away at age 19 from MI - chest pain currently resolved with nitropaste - Cardiac enzymes negative x 3  - Cardiology on board and we appreciate their help - Patient going for final part stress test today; patient has known CAD.  Further management pending stress test. - will continue nitro, ASA, Metoprolol, Lipitor, and Lisinopril  #HTN - Continue Metoprolol, Lisinopril, HCTZ - Hydralazine prn - Normotensive this am (134/78)  # DM - Diabetes uncontrolled , A1C 11.0 - CBG checks qac and qhs  - Moderate SSI + qhs coverage.  - Adding Lantus 10 U QHS. Will increase as needed per CBG.  # HLD  - Continue Lipitor 20mg  PO daily   # Smoking - Counseled at admission about importance of smoking cessation  - Smoking counselor to see tomorrow  - Nicotine patches prn   # FEN/GI- Currently NPO. # PPx- Lovenox ppx dose  # Dispo- Pending further work up and clinical improvement. Patient has many risk factors and will need to be on good outpatient regimen with consistent follow up.  #CodeChana Bode, DO 04/19/2012, 8:12 AM

## 2012-04-19 NOTE — Progress Notes (Signed)
Seen and examined.  I am pleased that her nuclear med stress test is good.  We will watch overnight and likely dc tomorrow am.  She is aware that lifestyle modifications and compliance are key to her survival.

## 2012-04-19 NOTE — Progress Notes (Signed)
Called patients nurse on 2000 and told her patient had zofran for nausea and asked her to replace patients nitropatch on return to her room as we removed it pre test at 1100.

## 2012-04-19 NOTE — Discharge Summary (Signed)
Family Medicine Teaching [facility] Discharge Summary  [name] Medical record number: [id number] Date of birth: [birth year] Age: 41 y.o. Gender: female Date of Admission: 04/17/2012  Date of Discharge: 04/20/12 Admitting Physician: Sanjuana Letters, MD  Primary Care Provider: DEFAULT,PROVIDER, MD  Indication for Hospitalization: Chest pain Discharge Diagnoses:  Chest pain HTN DM, type 2 Hyperlipidemia Tobacco abuse  Brief Hospital Course: 41 year old female with a PMH of DM, HTN, HLD, and tobacco abuse who presented with atypical chest pain. Patient has a significant family history for CAD.  1) Chest pain Patient presented with left sided chest pain described as dull/pressure sensation.  It was not associated with exertion, and there was no associated nausea/vomiting or diaphoresis.  It was associated with SOB.  In the ED, EKG was obtained and revealed normal sinus rhythm with t wave inversions in the lateral leads.  Point of care troponin was obtained and was negative.  Patient was immediately started on Aspirin and was given Nitroglycerin for chest pain.  Given symptoms and significant PMH and family history, patient was admitted.  During admission, cardiac enzymes were cycled and were negative. Patient was treated medically with Aspirin, Nitro, Beta blocker, and ACEI.  Cardiology was consulted during hospitalization and recommended nuclear stress test to evaluate for possible ischemia.  Stress test was negative and patient was discharge on the above drug regimen.  2) HTN Patient's hypertension was treated with Metoprolol, Lisinopril, and HCTZ.   3) DM, type 2 Patient is a known diabetic and has not been compliant.  A1C obtained during admission was 11.0.  Patient was placed on moderate SSI and Lantus 10 U QHS during hospitalization.  Patient was discharged on Amaryl and Metformin and will need close follow up for titration of therapy and perhaps insulin  therapy.  4) Hyperlipidemia Patient was started on Lipitor 20 mg daily during admission.  5) Tobacco abuse Patient received smoking cessation counseling during admission.  Patient was also given nicotine patches PRN. Patient will need continued outpatient counseling.   Significant Labs and Imaging:   CBC BMET   Lab 04/18/12 0625 04/17/12 1756 04/17/12 1420  WBC 10.0 12.8* 10.1  HGB 11.6* 12.2 11.5*  HCT 36.1 38.5 35.7*  PLT 316 320 307    Lab 04/19/12 0555 04/18/12 0625 04/17/12 1756 04/17/12 1338  NA 136 137 -- 140  K 3.7 3.2* -- 3.3*  CL 99 99 -- 100  CO2 30 28 -- 29  BUN 12 11 -- 9  CREATININE 0.97 0.91 0.92 --  GLUCOSE 207* 237* -- 312*  CALCIUM 9.3 9.3 -- 9.9     Lipid Panel     Component Value Date/Time   CHOL 166 04/18/2012 0625   TRIG 151* 04/18/2012 0625   HDL 48 04/18/2012 0625   CHOLHDL 3.5 04/18/2012 0625   VLDL 30 04/18/2012 0625   LDLCALC 88 04/18/2012 0625    Lab Results  Component Value Date   HGBA1C 11.0* 04/17/2012   Cardiac Panel (last 3 results)  Basename 04/18/12 0625 04/17/12 2255  CKTOTAL 120 134  CKMB 2.1 2.0  TROPONINI <0.30 <0.30  RELINDX 1.8 1.5   CBG (last 3)   Basename 04/20/12 1623 04/20/12 1107 04/20/12 0613  GLUCAP 253* 217* 244*   Dg Chest 2 View  04/17/2012 IMPRESSION: Stable cardiomegaly and linear scarring in the lingula.  No active process.     Nm Myocar Multi W/spect W/wall Motion / Ef  04/19/2012  IMPRESSION:  1.  Mild dilatation of the left ventricle with slightly decreased ejection fraction (56%) compared with prior study. 2.  No reversible perfusion defects to suggest myocardial ischemia.   Procedures: Nuclear stress test  Consultations: Cardiology, Dr. Sharyn Lull  Discharge Medications:  Medication List  As of 04/20/2012  6:18 PM   TAKE these medications         albuterol 108 (90 BASE) MCG/ACT inhaler   Commonly known as: PROVENTIL HFA;VENTOLIN HFA   Inhale 2 puffs into the lungs every 6 (six) hours as needed.  Shortness of breath      aspirin 81 MG EC tablet   Take 1 tablet (81 mg total) by mouth daily.      atorvastatin 20 MG tablet   Commonly known as: LIPITOR   Take 1 tablet (20 mg total) by mouth at bedtime.      glimepiride 2 MG tablet   Commonly known as: AMARYL   Take 1 tablet (2 mg total) by mouth daily before breakfast. May take before lunch if does not eat breakfast.      glucose blood test strip   Use as instructed      hydrochlorothiazide 25 MG tablet   Commonly known as: HYDRODIURIL   Take 1 tablet (25 mg total) by mouth daily.      ibuprofen 200 MG tablet   Commonly known as: ADVIL,MOTRIN   Take 600 mg by mouth every 6 (six) hours as needed. pain      lisinopril 10 MG tablet   Commonly known as: PRINIVIL,ZESTRIL   Take 2 tablets (20 mg total) by mouth daily.      metFORMIN 500 MG tablet   Commonly known as: GLUCOPHAGE   Take 1 tablet (500 mg total) by mouth 2 (two) times daily with a meal.      metoprolol tartrate 25 MG tablet   Commonly known as: LOPRESSOR   Take 1 tablet (25 mg total) by mouth 2 (two) times daily.      RELION LANCETS STANDARD 21G Misc   1 each by Does not apply route 2 (two) times daily.      RELION PRIME MONITOR Devi   1 each by Does not apply route 2 (two) times daily.           Issues for Follow Up:  1) Compliance with medication regimen should be addressed.  Patient needs good control of diabetes and HTN given PMH and significant family history for CAD.  Will likely need to increase metformin and amaryl and/or start patient on insulin if diabetes remains uncontrolled. 2) Blood glucose control should be addressed.  Repeat A1C in 3 months. 3) Consider referral/appointment for smoking cessation and diabetes education with Dr. Raymondo Band, and nutrition with Dr. Gerilyn Pilgrim.  Outstanding Results: None  Discharge Instructions: Patient was counseled important signs and symptoms that should prompt return to medical care, changes in medications, dietary  instructions, activity restrictions, and follow up appointments.  Patient was discharge with 3 day supply of meds.  Follow-up Information    Follow up with Majel Homer, MD on 04/25/2012. (2:45)    Contact information:   [address] [place] [address] [phone]       Schedule an appointment as soon as possible for a visit with Robynn Pane, MD.   Contact information:   [address] [place] [address] [phone]         Discharge Condition: Stable. Discharged home.   Adriana Simas Adwolf, DO 04/20/2012, 6:18  PM

## 2012-04-19 NOTE — Progress Notes (Signed)
Subjective:  Patient denies any chest pain or shortness of breath nuclear stress test is negative for ischemia with normal LV systolic function  Objective:  Vital Signs in the last 24 hours: Temp:  [97.8 F (36.6 C)-98.8 F (37.1 C)] 98.8 F (37.1 C) (08/23 1349) Pulse Rate:  [68-89] 77  (08/23 1349) Resp:  [19-20] 20  (08/23 1349) BP: (133-166)/(78-105) 145/93 mmHg (08/23 1816) SpO2:  [98 %-100 %] 100 % (08/23 1349)  Intake/Output from previous day: 08/22 0701 - 08/23 0700 In: -  Out: 300 [Urine:300] Intake/Output from this shift:    Physical Exam: Neck: no adenopathy, no carotid bruit, no JVD and supple, symmetrical, trachea midline Lungs: clear to auscultation bilaterally Heart: regular rate and rhythm, S1, S2 normal, no murmur, click, rub or gallop Abdomen: soft, non-tender; bowel sounds normal; no masses,  no organomegaly Extremities: extremities normal, atraumatic, no cyanosis or edema  Lab Results:  [facility] 04/18/12 0625 04/17/12 1756  WBC 10.0 12.8*  HGB 11.6* 12.2  PLT 316 320    Basename 04/19/12 0555 04/18/12 0625  NA 136 137  K 3.7 3.2*  CL 99 99  CO2 30 28  GLUCOSE 207* 237*  BUN 12 11  CREATININE 0.97 0.91    Basename 04/18/12 0625 04/17/12 2255  TROPONINI <0.30 <0.30   Hepatic Function Panel  Basename 04/18/12 0625  PROT 7.2  ALBUMIN 3.1*  AST 16  ALT 12  ALKPHOS 69  BILITOT 0.3  BILIDIR --  IBILI --    Basename 04/18/12 0625  CHOL 166   No results found for this basename: PROTIME in the last 72 hours  Imaging: Imaging results have been reviewed and Nm Myocar Multi W/spect W/wall Motion / Ef  04/19/2012  *RADIOLOGY REPORT*  Clinical Data:  Chest pain.  History of catheterization.  Multiple risk factors for coronary disease.  MYOCARDIAL IMAGING WITH SPECT (REST AND PHARMACOLOGIC-STRESS - 2 DAY PROTOCOL) GATED LEFT VENTRICULAR WALL MOTION STUDY LEFT VENTRICULAR EJECTION FRACTION  Technique:  Standard myocardial SPECT imaging was  performed after intravenous injection of 30 mCi Tc-47m Myoview at rest.  On a different day, intravenous infusion of  Lexiscan was performed under supervision of the Cardiology staff.  At peak effect of the drug, 30 mCi Tc-37m Myoview was injected intravenously and standard myocardial SPECT imaging was performed.  Quantitative gated imaging was also performed to evaluate left ventricular wall motion and estimate left ventricular ejection fraction.  Comparison:  Prior examination 12/17/2006.  Findings: There is stable mild breast attenuation of the anterior wall.  No reversible or suspicious fixed perfusion defects are identified.  The left ventricle appears mildly dilated.  The left ventricular ejection fraction is 56% with an end diastolic volume of 115 ml and an end systolic volume of 50 ml.  This compares with an ejection fraction of 67% previously.  IMPRESSION:  1.  Mild dilatation of the left ventricle with slightly decreased ejection fraction (56%) compared with prior study. 2.  No reversible perfusion defects to suggest myocardial ischemia.   Original Report Authenticated By: Gerrianne Scale, M.D.     Cardiac Studies:  Assessment/Plan:  Status post Atypical chest pain negative nuclear stress test Mild to moderate coronary artery disease in the past  Uncontrolled hypertension secondary to noncompliance to medication  Non-insulin-dependent diabetes mellitus  Hypercholesteremia  Morbid obesity  Strong family history of coronary artery disease  Tobacco abuse  Plan Okay to discharge from cardiac point of view Sign off please call if needed thank you  LOS: 2 days    Diamond Rice 04/19/2012, 6:35 PM

## 2012-04-20 ENCOUNTER — Encounter: Payer: Self-pay | Admitting: Family Medicine

## 2012-04-20 LAB — GLUCOSE, CAPILLARY
Glucose-Capillary: 244 mg/dL — ABNORMAL HIGH (ref 70–99)
Glucose-Capillary: 253 mg/dL — ABNORMAL HIGH (ref 70–99)

## 2012-04-20 NOTE — Progress Notes (Signed)
Seen and examined.  Discussed with Dr. Adriana Simas.  Agree with management, documentation and DC today.  I am surprised that she is reluctant to leave.  I emphasized that we have not cured her - only pointed her in the right direction.  It will be up to her to comply with meds and adjust her lifestyle if she wants to journey to good health.

## 2012-04-20 NOTE — Progress Notes (Signed)
Pt discharged per MD order and protocol. Discharge instructions and medications reviewed with patient. All questions answered. Pt aware of follow up appointments. Pt given 3 day supply of discharge medication from pharmacy per MD order. Pt also given return to work note from MD.

## 2012-04-20 NOTE — Progress Notes (Signed)
Family Medicine Teaching Service Daily Progress Note Service Page: [phone]  Subjective:  No chest pain this am.  Continues to report headache.  Objective: Temp:  [98.1 F (36.7 C)-98.8 F (37.1 C)] 98.4 F (36.9 C) (08/24 0506) Pulse Rate:  [77-79] 79  (08/24 0506) Resp:  [20] 20  (08/23 2057) BP: (145-179)/(79-111) 154/99 mmHg (08/24 0637) SpO2:  [97 %-100 %] 98 % (08/24 0506) Weight:  [252 lb 3.2 oz (114.397 kg)] 252 lb 3.2 oz (114.397 kg) (08/24 0534)  Exam: General: awake, alert. NAD. Cardiovascular: RRR. No murmurs, rubs, or gallops. Respiratory: CTAB. No rales, rhonchi, or wheeze. Abdomen: soft, nontender, nondistended. Extremities: no lower extremity edema.  I have reviewed the patient's medications, labs, imaging, and diagnostic testing.  Notable results are summarized below.  CBC BMET   Lab 04/18/12 0625 04/17/12 1756 04/17/12 1420  WBC 10.0 12.8* 10.1  HGB 11.6* 12.2 11.5*  HCT 36.1 38.5 35.7*  PLT 316 320 307    Lab 04/19/12 0555 04/18/12 0625 04/17/12 1756 04/17/12 1338  NA 136 137 -- 140  K 3.7 3.2* -- 3.3*  CL 99 99 -- 100  CO2 30 28 -- 29  BUN 12 11 -- 9  CREATININE 0.97 0.91 0.92 --  GLUCOSE 207* 237* -- 312*  CALCIUM 9.3 9.3 -- 9.9     Imaging/Diagnostic Tests:  Lipid Panel     Component Value Date/Time   CHOL 166 04/18/2012 0625   TRIG 151* 04/18/2012 0625   HDL 48 04/18/2012 0625   CHOLHDL 3.5 04/18/2012 0625   VLDL 30 04/18/2012 0625   LDLCALC 88 04/18/2012 0625   Cardiac Panel (last 3 results)  Basename 04/18/12 0625 04/17/12 2255 04/17/12 1800  CKTOTAL 120 134 151  CKMB 2.1 2.0 2.3  TROPONINI <0.30 <0.30 <0.30  RELINDX 1.8 1.5 1.5   TSH - 0.887  Lab Results  Component Value Date   HGBA1C 11.0* 04/17/2012     Nm Myocar Multi W/spect W/wall Motion / Ef  04/19/2012  IMPRESSION:  1.  Mild dilatation of the left ventricle with slightly decreased ejection fraction (56%) compared with prior study. 2.  No reversible perfusion defects  to suggest myocardial ischemia.  Asssessment/Plan: 41 year old female with a PMH of DM, HTN, HLD, and tobacco abuse who presents with chest pain.  # Chest pain-  Patient with atypical appearing chest pain with prior history of the same. She is also a smoker, obese, uncontrolled DM, untreated HTN, reported HLD and also has positive family history with her mother passing away at age 59 from MI - chest pain currently resolved with nitropaste - Cardiac enzymes negative x 3  - will continue nitro, ASA, Metoprolol, Lipitor, and Lisinopril - Nuclear stress test revealed EF of 56 % and no reversible perfusion defects - Cardiology signed off and okay discharge - Patient stable and doing well; will discharge today.  #HTN - Continue Metoprolol, Lisinopril, HCTZ  # DM - Diabetes uncontrolled , A1C 11.0 - CBG checks qac and qhs  - Moderate SSI + qhs coverage.  Lantus QHS - Will discharge home today on Metformin and Amaryl  # HLD  - Continue Lipitor 20mg  PO daily   # Smoking - Counseled at admission about importance of smoking cessation   # FEN/GI- Currently NPO. # PPx- Lovenox ppx dose  # Dispo- Discharge home today #Code: Full  Everlene Other, DO 04/20/2012, 8:55 AM

## 2012-04-21 NOTE — Discharge Summary (Signed)
Seen and examined on the day of DC.  See my co-sign of the progress note for that day.  Agree with DC as outlined by Dr. Adriana Simas.

## 2012-04-25 ENCOUNTER — Ambulatory Visit (INDEPENDENT_AMBULATORY_CARE_PROVIDER_SITE_OTHER): Payer: Self-pay | Admitting: Family Medicine

## 2012-04-25 ENCOUNTER — Encounter: Payer: Self-pay | Admitting: Family Medicine

## 2012-04-25 VITALS — BP 210/128 | HR 96 | Temp 97.9°F | Ht 61.0 in | Wt 247.9 lb

## 2012-04-25 DIAGNOSIS — Z6841 Body Mass Index (BMI) 40.0 and over, adult: Secondary | ICD-10-CM

## 2012-04-25 DIAGNOSIS — R079 Chest pain, unspecified: Secondary | ICD-10-CM

## 2012-04-25 DIAGNOSIS — I10 Essential (primary) hypertension: Secondary | ICD-10-CM

## 2012-04-25 NOTE — Progress Notes (Signed)
Patient ID: DAYNA ALIA, female   DOB: 06-25-1971, 41 y.o.   MRN: [id number] Subjective: The patient is a 41 y.o. year old female who presents today for hospital follow up.  1. Chest pain/SOB: Patient has continued to have a small amount of chest heaviness one to two times since discharge.  2. HTN: Still lightheaded/dizzy since discharge.  Does have some problems with blurred vision when dizzy and does currently have a headache.  Not much nausea but no appetite.  No significant SOB currently.  Patient's past medical, social, and family history were reviewed and updated as appropriate. History  Substance Use Topics  . Smoking status: Current Some Day Smoker -- 0.0 packs/day for 10 years    Types: Cigarettes  . Smokeless tobacco: Never Used  . Alcohol Use: Yes     04/17/2012 "< 1 can of beer once or twice/month"   Objective:  Filed Vitals:   04/25/12 1516  BP: 210/128  Pulse: 96  Temp: 97.9 F (36.6 C)   Gen: NAD, morbidly obese CV: RRR, no murmurs Resp: CTABL, no wheezes Ext: No edema, 2+ pulses  Assessment/Plan: Patient encouraged to finish forms she was given in the hospital for financial assistance.  Please also see individual problems in problem list for problem-specific plans.

## 2012-04-25 NOTE — Assessment & Plan Note (Signed)
Not ischemic in etiology per stress test.  Likely related to uncontrolled HTN.  With recent negative stress, will defer any further workup until patient has better bp control.

## 2012-04-25 NOTE — Patient Instructions (Signed)
It was good to meet you today! I think it is very important that you get your blood pressure medications filled as soon as possible.  I would even go so far as to suggest calling friends to see if you could borrow at least $4 so you could get one of them and get started on it tonight.  This is important to avoid strokes and heart attacks.  Regardless, you NEED to get your blood pressure medications tomorrow. If you start having any weakness, problems speaking, or loss of vision, go directly to the ER. Come back to see [place] either Tuesday or Wednesday of next week so we can see what your blood pressure is doing when you are on your medications.

## 2012-04-25 NOTE — Assessment & Plan Note (Signed)
Patient currently with no new symptoms and elevated bp.  She will be able to get medications tomorrow morning.  Stressed importance of obtaining meds ASAP to patient who says that she cannot get them until the morning.  In the absence of any new symptoms and no significant findings on exam, will send home with strict instructions on reasons for presentation to ED.  Patient to follow up in several days once on bp meds for titration.

## 2012-04-30 ENCOUNTER — Ambulatory Visit (INDEPENDENT_AMBULATORY_CARE_PROVIDER_SITE_OTHER): Payer: Self-pay | Admitting: Family Medicine

## 2012-04-30 ENCOUNTER — Encounter: Payer: Self-pay | Admitting: Family Medicine

## 2012-04-30 VITALS — BP 169/106 | HR 86 | Ht 61.0 in | Wt 248.0 lb

## 2012-04-30 DIAGNOSIS — R079 Chest pain, unspecified: Secondary | ICD-10-CM

## 2012-04-30 DIAGNOSIS — E119 Type 2 diabetes mellitus without complications: Secondary | ICD-10-CM

## 2012-04-30 DIAGNOSIS — I10 Essential (primary) hypertension: Secondary | ICD-10-CM

## 2012-04-30 LAB — BASIC METABOLIC PANEL
Glucose, Bld: 186 mg/dL — ABNORMAL HIGH (ref 70–99)
Potassium: 3.6 mEq/L (ref 3.5–5.3)
Sodium: 133 mEq/L — ABNORMAL LOW (ref 135–145)

## 2012-04-30 NOTE — Progress Notes (Signed)
  Subjective:    Patient ID: Diamond Rice, female    DOB: [birth year], 41 y.o.   MRN: [id number]  HPI: Pt is a 41yo with PMH significant for DM type II, HLD, and HTN presenting for hospital/clinic follow-up. Pt recently discharged from the hospital with chest pain and HTN, seen in clinic 5 days ago with BP 210/128; pt had not been able to get BP meds at that time and was instructed to get medication and return to clinic for recheck of BP and titration of meds as needed.  Pt states she was able to get medications Friday. Taking lisinopril 20 mg, HCTZ 25 mg, and metoprolol 25 mg BID. Currently feels okay. States she has had no more chest pain or SOB. Still with occasional dizziness but no more blurred vision or headache.  Review of Systems: See HPI above     Objective:   Physical Exam BP 169/106  Pulse 86  Ht 5\' 1"  (1.549 m)  Wt 248 lb (112.492 kg)  BMI 46.86 kg/m2  LMP 04/04/2012 Gen: obese female in NAD, appears comfortable, pleasant and cooperative with exam Pulm: CTAB, no wheezes Cardio: RRR, no murmur/rub appreciated Ext: distal pulses 2+ and intact/symmetric bilaterally     Assessment & Plan:

## 2012-04-30 NOTE — Assessment & Plan Note (Signed)
A: Last A1c in the hospital was 11.0. Pt checking blood sugars at home but infrequently. P: Continue Amaryl 2 mg daily. Increased metformin from 1000 mg daily to 1500 mg daily. Will check BMP today and continue to follow up; may need to consider insulin regimen in the future in addition to oral meds.

## 2012-04-30 NOTE — Assessment & Plan Note (Addendum)
A: Pt with no new symptoms. BP remains elevated but improved from last week. P: Continue metoprolol, HCTZ. Increased lisinopril dose from 20 mg daily to 40 mg. Will check BMP today to look at kidney function. Pt to return to clinic in about 2 weeks. May need to consider also increasing metoprolol vs trying different agents.

## 2012-04-30 NOTE — Assessment & Plan Note (Signed)
A: No current chest pain or new symptoms since discharge from hospital. Negative stress test during that admission. P: Stressed need for pt to schedule follow-up with Dr. Sharyn Lull as was discussed during her admission and with Dr. Louanne Belton during her f/u visit last Thursday. Also stressed importance of improving blood pressure control.

## 2012-04-30 NOTE — Patient Instructions (Signed)
Thank you for coming in to the clinic, today! Your blood pressure is still high but it is getting better. Keep doing what you're doing!  Blood pressure - We want you to start taking Lisinopril 40 mg per day (up from 20 mg). We will also check blood today to look at your kidney function (lisinopril can cause a "bump" in your kidney lab numbers; your last check in the hospital was normal and we want to make sure your body can tolerate the higher dose).  Diabetes - We want you to increase your dose of metformin, as well. Start taking 3 500 mg tablets per day; you can take 1 at a time with meals for a total of 1500 mg per day.  Please come back to see [place] in about 2 weeks. Try to make an appointment with Dr. Adriana Simas or myself (Dr. Casper Harrison).

## 2012-05-03 ENCOUNTER — Telehealth: Payer: Self-pay | Admitting: Family Medicine

## 2012-05-03 DIAGNOSIS — I10 Essential (primary) hypertension: Secondary | ICD-10-CM

## 2012-05-03 NOTE — Telephone Encounter (Signed)
LVMOM for patient to call back to inform of below and have a lab appointment made for this

## 2012-05-03 NOTE — Telephone Encounter (Signed)
Could I get you to contact patient for a lab draw middle of next week to check on her kidney function?  I've put the order in.

## 2012-05-03 NOTE — Telephone Encounter (Signed)
Message copied by [facility] on Fri May 03, 2012  2:01 PM ------      Message from: Bobbye Morton      Created: Fri May 03, 2012 11:56 AM       Pt with elevated BP's, recently started on lisinopril (20 mg) during hospitalization. Dose increased to 40 mg on 9/3. Also taking HCTZ 25 mg daily and metoprolol 25 BID. May need to reconsider BP med dosing/agents given rise in creatinine after starting lisinopril 20 mg. Pt to see Dr. Louanne Belton on 9/17.

## 2012-05-06 NOTE — Telephone Encounter (Signed)
Lab appointment 9/13 @ 4pm

## 2012-05-10 ENCOUNTER — Other Ambulatory Visit: Payer: Self-pay

## 2012-05-14 ENCOUNTER — Ambulatory Visit: Payer: Self-pay | Admitting: Family Medicine

## 2012-05-17 ENCOUNTER — Encounter: Payer: Self-pay | Admitting: Family Medicine

## 2012-05-17 ENCOUNTER — Ambulatory Visit (INDEPENDENT_AMBULATORY_CARE_PROVIDER_SITE_OTHER): Payer: Self-pay | Admitting: Family Medicine

## 2012-05-17 VITALS — BP 189/112 | HR 75 | Temp 99.0°F | Ht 61.0 in | Wt 244.7 lb

## 2012-05-17 DIAGNOSIS — Z6841 Body Mass Index (BMI) 40.0 and over, adult: Secondary | ICD-10-CM

## 2012-05-17 DIAGNOSIS — I10 Essential (primary) hypertension: Secondary | ICD-10-CM

## 2012-05-17 DIAGNOSIS — E119 Type 2 diabetes mellitus without complications: Secondary | ICD-10-CM

## 2012-05-17 DIAGNOSIS — E785 Hyperlipidemia, unspecified: Secondary | ICD-10-CM

## 2012-05-17 MED ORDER — HYDROCHLOROTHIAZIDE 25 MG PO TABS: 25.0000 mg | ORAL_TABLET | Freq: Every day | ORAL | Status: DC

## 2012-05-17 MED ORDER — LISINOPRIL 40 MG PO TABS: 40.0000 mg | ORAL_TABLET | Freq: Every day | ORAL | Status: DC

## 2012-05-17 MED ORDER — ATORVASTATIN CALCIUM 20 MG PO TABS: 20.0000 mg | ORAL_TABLET | Freq: Every day | ORAL | Status: DC

## 2012-05-17 MED ORDER — GLIMEPIRIDE 2 MG PO TABS: 2.0000 mg | ORAL_TABLET | Freq: Every day | ORAL | Status: DC

## 2012-05-17 MED ORDER — METFORMIN HCL 1000 MG PO TABS: 1000.0000 mg | ORAL_TABLET | Freq: Two times a day (BID) | ORAL | Status: AC

## 2012-05-17 MED ORDER — AMLODIPINE BESYLATE 10 MG PO TABS: 10.0000 mg | ORAL_TABLET | Freq: Every day | ORAL | Status: DC

## 2012-05-17 MED ORDER — METOPROLOL TARTRATE 25 MG PO TABS: 25.0000 mg | ORAL_TABLET | Freq: Two times a day (BID) | ORAL | Status: DC

## 2012-05-17 NOTE — Progress Notes (Signed)
Patient ID: Diamond Rice, female   DOB: 06/24/1971, 41 y.o.   MRN: [id number] Subjective: The patient is a 41 y.o. year old female who presents today for f/u.  1) Today has been having problems with feeling dizzy and headache.  Some nausea.  No rhinorrhea or cough.  This has just started happening today.  2) HTN: Has been on higher dose of lisinopril now for several weeks and is now out.  Last pills this morning.  No problems with cough or swelling.  No CP/SOB.  No LE edema.  Reports compliance with meds.  3) DM: Checks AM CBGs intermittently.  Reports 120s-160s.  No hypoglycemia.  Is tolerating metformin OK, although she does report some loose stools.  4) Obesity: Patient is working hard to loose weight.  Is walking more routinely and is happy to see some weight loss.  Patient's past medical, social, and family history were reviewed and updated as appropriate. History  Substance Use Topics  . Smoking status: Current Some Day Smoker -- 0.0 packs/day for 10 years    Types: Cigarettes  . Smokeless tobacco: Never Used  . Alcohol Use: Yes     04/17/2012 "< 1 can of beer once or twice/month"   Objective:  Filed Vitals:   05/17/12 1414  BP: 189/112  Pulse: 75  Temp: 99 F (37.2 C)   Gen: NAD, morbidly obese Ext: No edema noted  Assessment/Plan:  Please also see individual problems in problem list for problem-specific plans.

## 2012-05-17 NOTE — Assessment & Plan Note (Signed)
Pt currently taking 2-500mg  pills two times daily.  Will change to 1-1000mg  pill BID.  Continue to encourage better diet and weight loss.  Will have to wait several months before deciding on insulin therapy.

## 2012-05-17 NOTE — Assessment & Plan Note (Signed)
Poorly controlled.  No signs of end-organ damage at this time (current symptoms of lightheadedness are poorly correlated with patient's blood pressure).  None the less, will add amlodipine and see back in 2 weeks to recheck.  Currently on max-dose lisinopril and HCTZ.  Check BMET to see how tolerating lisinopril.

## 2012-05-17 NOTE — Patient Instructions (Signed)
It was good to see you today! I have filled out your paperwork today. We will start you on the medicine Amlodipine for your blood pressure. Please come back to see me in about 2 weeks to see how your blood pressure is doing.

## 2012-05-17 NOTE — Assessment & Plan Note (Addendum)
Congratulated patient on weight loss and encouraged continued work on this.

## 2012-05-18 LAB — BASIC METABOLIC PANEL
CO2: 29 mEq/L (ref 19–32)
Calcium: 10.1 mg/dL (ref 8.4–10.5)
Chloride: 99 mEq/L (ref 96–112)
Glucose, Bld: 184 mg/dL — ABNORMAL HIGH (ref 70–99)
Sodium: 137 mEq/L (ref 135–145)

## 2012-06-03 ENCOUNTER — Ambulatory Visit: Payer: Self-pay | Admitting: Family Medicine

## 2012-07-08 ENCOUNTER — Encounter: Payer: Self-pay | Admitting: Home Health Services

## 2012-07-10 ENCOUNTER — Encounter: Payer: Self-pay | Admitting: Home Health Services

## 2012-10-21 ENCOUNTER — Telehealth: Payer: Self-pay | Admitting: *Deleted

## 2012-10-21 NOTE — Telephone Encounter (Signed)
Call made to schedule overdue A1C check. Appt made for 11/08/12.

## 2012-11-08 ENCOUNTER — Ambulatory Visit: Payer: Medicaid Other | Admitting: Family Medicine

## 2012-11-12 ENCOUNTER — Encounter: Payer: Self-pay | Admitting: Home Health Services

## 2012-11-18 ENCOUNTER — Encounter (HOSPITAL_COMMUNITY): Payer: Self-pay | Admitting: Emergency Medicine

## 2012-11-18 ENCOUNTER — Emergency Department (HOSPITAL_COMMUNITY): Payer: Self-pay

## 2012-11-18 ENCOUNTER — Emergency Department (HOSPITAL_COMMUNITY)
Admission: EM | Admit: 2012-11-18 | Discharge: 2012-11-19 | Disposition: A | Discharge status: Home / Self Care | Payer: Self-pay | Attending: Emergency Medicine | Admitting: Emergency Medicine

## 2012-11-18 DIAGNOSIS — I10 Essential (primary) hypertension: Secondary | ICD-10-CM | POA: Insufficient documentation

## 2012-11-18 DIAGNOSIS — Z79899 Other long term (current) drug therapy: Secondary | ICD-10-CM | POA: Insufficient documentation

## 2012-11-18 DIAGNOSIS — E7800 Pure hypercholesterolemia, unspecified: Secondary | ICD-10-CM | POA: Insufficient documentation

## 2012-11-18 DIAGNOSIS — F17200 Nicotine dependence, unspecified, uncomplicated: Secondary | ICD-10-CM | POA: Insufficient documentation

## 2012-11-18 DIAGNOSIS — Z9861 Coronary angioplasty status: Secondary | ICD-10-CM | POA: Insufficient documentation

## 2012-11-18 DIAGNOSIS — M79609 Pain in unspecified limb: Secondary | ICD-10-CM | POA: Insufficient documentation

## 2012-11-18 DIAGNOSIS — Z8679 Personal history of other diseases of the circulatory system: Secondary | ICD-10-CM | POA: Insufficient documentation

## 2012-11-18 DIAGNOSIS — G8929 Other chronic pain: Secondary | ICD-10-CM | POA: Insufficient documentation

## 2012-11-18 DIAGNOSIS — E119 Type 2 diabetes mellitus without complications: Secondary | ICD-10-CM | POA: Insufficient documentation

## 2012-11-18 DIAGNOSIS — M79604 Pain in right leg: Secondary | ICD-10-CM

## 2012-11-18 DIAGNOSIS — Z8659 Personal history of other mental and behavioral disorders: Secondary | ICD-10-CM | POA: Insufficient documentation

## 2012-11-18 LAB — PROTIME-INR
INR: 0.98 (ref 0.00–1.49)
Prothrombin Time: 12.9 seconds (ref 11.6–15.2)

## 2012-11-18 LAB — CBC
HCT: 37.5 % (ref 36.0–46.0)
Hemoglobin: 12.5 g/dL (ref 12.0–15.0)
MCH: 23.7 pg — ABNORMAL LOW (ref 26.0–34.0)
MCHC: 33.3 g/dL (ref 30.0–36.0)
MCV: 71.2 fL — ABNORMAL LOW (ref 78.0–100.0)
RDW: 15.2 % (ref 11.5–15.5)

## 2012-11-18 LAB — POCT I-STAT, CHEM 8
Creatinine, Ser: 0.9 mg/dL (ref 0.50–1.10)
HCT: 42 % (ref 36.0–46.0)
Hemoglobin: 14.3 g/dL (ref 12.0–15.0)
Potassium: 3.6 mEq/L (ref 3.5–5.1)
Sodium: 138 mEq/L (ref 135–145)
TCO2: 31 mmol/L (ref 0–100)

## 2012-11-18 MED ORDER — AMLODIPINE BESYLATE 10 MG PO TABS: 10.0000 mg | ORAL_TABLET | Freq: Every day | ORAL | Status: DC

## 2012-11-18 MED ORDER — METFORMIN HCL 500 MG PO TABS: 1000.0000 mg | ORAL_TABLET | Freq: Two times a day (BID) | ORAL | Status: DC

## 2012-11-18 MED ORDER — HYDROCODONE-ACETAMINOPHEN 5-325 MG PO TABS: 2.0000 | ORAL_TABLET | ORAL | Status: DC | PRN

## 2012-11-18 MED ORDER — OXYCODONE-ACETAMINOPHEN 5-325 MG PO TABS
1.0000 | ORAL_TABLET | Freq: Once | ORAL | Status: AC
  Administered 2012-11-18: 1 via ORAL
  Filled 2012-11-18: qty 1

## 2012-11-18 MED ORDER — GLIMEPIRIDE 2 MG PO TABS: 2.0000 mg | ORAL_TABLET | Freq: Every day | ORAL | Status: DC

## 2012-11-18 MED ORDER — CLONIDINE HCL 0.1 MG PO TABS
0.1000 mg | ORAL_TABLET | Freq: Once | ORAL | Status: AC
  Administered 2012-11-18: 0.1 mg via ORAL
  Filled 2012-11-18: qty 1

## 2012-11-18 MED ORDER — ENOXAPARIN SODIUM 100 MG/ML ~~LOC~~ SOLN
100.0000 mg | Freq: Once | SUBCUTANEOUS | Status: AC
  Administered 2012-11-19: 100 mg via SUBCUTANEOUS
  Filled 2012-11-18 (×2): qty 1

## 2012-11-18 MED ORDER — METOPROLOL TARTRATE 100 MG PO TABS: ORAL_TABLET | ORAL | Status: DC

## 2012-11-18 MED ORDER — LISINOPRIL 20 MG PO TABS: 40.0000 mg | ORAL_TABLET | Freq: Every day | ORAL | Status: DC

## 2012-11-18 NOTE — ED Notes (Signed)
Pt. returned from XR. 

## 2012-11-18 NOTE — ED Provider Notes (Addendum)
History     CSN: [id number]  Arrival date & time 11/18/12  1191   First MD Initiated Contact with Patient 11/18/12 2258      Chief Complaint  Patient presents with  . Leg Pain    (Consider location/radiation/quality/duration/timing/severity/associated sxs/prior treatment) Patient is a 42 y.o. female presenting with leg pain. The history is provided by the patient.  Leg Pain Location:  Leg Time since incident:  1 week Injury: no   Leg location:  R leg Pain details:    Quality:  Aching   Radiates to:  Does not radiate   Severity:  Mild   Onset quality:  Gradual   Timing:  Constant   Progression:  Worsening Chronicity:  New Dislocation: no   Foreign body present:  No foreign bodies Prior injury to area:  No Relieved by:  Nothing Worsened by:  Nothing tried Ineffective treatments:  None tried Associated symptoms: no back pain   Risk factors: no concern for non-accidental trauma     Past Medical History  Diagnosis Date  . Hypertension   . Type II diabetes mellitus   . Anginal pain   . High cholesterol   . Shortness of breath 04/17/2012    "all the time"  . Headache 04/17/2012    "~ qod; lately waking up in am w/one"  . Migraines 04/17/2012  . Chronic lower back pain 04/17/2012    "just got over some; catched when I walked"  . Depression   . Anxiety     Past Surgical History  Procedure Laterality Date  . Cardiac catheterization  ~ 2011    History reviewed. No pertinent family history.  History  Substance Use Topics  . Smoking status: Current Some Day Smoker -- 0.08 packs/day for 10 years    Types: Cigarettes  . Smokeless tobacco: Never Used  . Alcohol Use: Yes     Comment: 04/17/2012 "< 1 can of beer once or twice/month"    OB History   Grav Para Term Preterm Abortions TAB SAB Ect Mult Living                  Review of Systems  Musculoskeletal: Negative for back pain.  All other systems reviewed and are negative.    Allergies  Morphine and  related  Home Medications   Current Outpatient Rx  Name  Route  Sig  Dispense  Refill  . albuterol (PROVENTIL HFA;VENTOLIN HFA) 108 (90 BASE) MCG/ACT inhaler   Inhalation   Inhale 2 puffs into the lungs every 6 (six) hours as needed. Shortness of breath         . amLODipine (NORVASC) 10 MG tablet   Oral   Take 1 tablet (10 mg total) by mouth daily.   90 tablet   3   . glimepiride (AMARYL) 2 MG tablet   Oral   Take 1 tablet (2 mg total) by mouth daily before breakfast. May take before lunch if does not eat breakfast.   30 tablet   3     Please inform patient that she must take just befo ...   . hydrochlorothiazide (HYDRODIURIL) 25 MG tablet   Oral   Take 1 tablet (25 mg total) by mouth daily.   30 tablet   3   . ibuprofen (ADVIL,MOTRIN) 200 MG tablet   Oral   Take 600 mg by mouth every 6 (six) hours as needed for pain.          Marland Kitchen lisinopril (PRINIVIL,ZESTRIL) 40  MG tablet   Oral   Take 1 tablet (40 mg total) by mouth daily.   90 tablet   3   . metFORMIN (GLUCOPHAGE) 1000 MG tablet   Oral   Take 1 tablet (1,000 mg total) by mouth 2 (two) times daily with a meal.   60 tablet   3   . metoprolol tartrate (LOPRESSOR) 25 MG tablet   Oral   Take 1 tablet (25 mg total) by mouth 2 (two) times daily.   60 tablet   3   . Blood Glucose Monitoring Suppl (RELION PRIME MONITOR) DEVI   Does not apply   1 each by Does not apply route 2 (two) times daily.   1 Device   0   . glucose blood (RELION GLUCOSE TEST STRIPS) test strip      Use as instructed   100 each   0   . RELION LANCETS STANDARD 21G MISC   Does not apply   1 each by Does not apply route 2 (two) times daily.   200 each   0     BP 178/101  Pulse 87  Temp(Src) 98.4 F (36.9 C) (Oral)  Resp 22  Ht 5\' 1"  (1.549 m)  Wt 240 lb (108.863 kg)  BMI 45.37 kg/m2  SpO2 99%  Physical Exam  Constitutional: She is oriented to person, place, and time. She appears well-developed and well-nourished.   HENT:  Head: Normocephalic and atraumatic.  Eyes: Conjunctivae and EOM are normal. Pupils are equal, round, and reactive to light.  Neck: Normal range of motion.  Cardiovascular: Normal rate, regular rhythm and normal heart sounds.   Pulmonary/Chest: Effort normal and breath sounds normal.  Abdominal: Soft. Bowel sounds are normal.  Musculoskeletal: Normal range of motion.  Neurological: She is alert and oriented to person, place, and time.  Rt calf tenderness to palpation  Skin: Skin is warm and dry.  Psychiatric: She has a normal mood and affect. Her behavior is normal.    ED Course  Procedures (including critical care time)  Labs Reviewed - No data to display No results found.   No diagnosis found.    MDM  + calf tenderness.  WIll anticoagulate,  [place] in am.  No chest pain, no sob. Noncompliant with medication. Will refill rx,  Antihypertensive, analgesia, fu [place] in am        Rosanne Ashing, MD 11/18/12 2328  Dennis Killilea Lytle Michaels, MD 11/18/12 2330

## 2012-11-18 NOTE — ED Notes (Signed)
Pt c/o right leg pain x 1 week that is generalized; pt sts out of htn meds x 1 week

## 2012-11-18 NOTE — ED Notes (Signed)
RN aware of pt's high blood pressure; pt states she does take high blood pressure medicine but has "been out of it for about a week".

## 2012-11-18 NOTE — ED Notes (Signed)
Pt c/o right leg pain x2 weeks. States "Nothing happened to it, it just started hurting one day." Pt is ambulatory, but is walking with limp. Pt states it started in her knee and now hurt all the way up and down. NO deformity or swelling. Leg is warm and dry. Neg homans sign. Pulses present, cap refill <3 sec.

## 2012-11-19 ENCOUNTER — Encounter (HOSPITAL_COMMUNITY): Payer: Self-pay | Admitting: Unknown Physician Specialty

## 2012-11-19 ENCOUNTER — Ambulatory Visit (HOSPITAL_COMMUNITY)
Admission: RE | Admit: 2012-11-19 | Discharge: 2012-11-19 | Disposition: A | Discharge status: Home / Self Care | Payer: Self-pay | Source: Ambulatory Visit

## 2012-11-19 DIAGNOSIS — M79609 Pain in unspecified limb: Secondary | ICD-10-CM

## 2012-11-19 NOTE — Progress Notes (Addendum)
*  Preliminary Results* Right lower extremity venous duplex completed. Right lower extremity is negative for deep vein thrombosis. No evidence of right Baker's cyst. Patient was seen in the ED last night, states her PCP is Dr.Rich. Unable to find contact number for PCP.  11/19/2012 8:20 AM Gertie Fey, RDMS, RDCS

## 2013-05-22 ENCOUNTER — Observation Stay (HOSPITAL_COMMUNITY)
Admission: EM | Admit: 2013-05-22 | Discharge: 2013-05-26 | Disposition: A | Discharge status: Other Acute Inpatient Hospital | Payer: Self-pay | Attending: Family Medicine | Admitting: Family Medicine

## 2013-05-22 ENCOUNTER — Emergency Department (HOSPITAL_COMMUNITY): Payer: Self-pay

## 2013-05-22 ENCOUNTER — Encounter (HOSPITAL_COMMUNITY): Payer: Self-pay | Admitting: Emergency Medicine

## 2013-05-22 DIAGNOSIS — Z9119 Patient's noncompliance with other medical treatment and regimen: Secondary | ICD-10-CM | POA: Insufficient documentation

## 2013-05-22 DIAGNOSIS — F17200 Nicotine dependence, unspecified, uncomplicated: Secondary | ICD-10-CM

## 2013-05-22 DIAGNOSIS — Z5900 Homelessness unspecified: Secondary | ICD-10-CM | POA: Insufficient documentation

## 2013-05-22 DIAGNOSIS — E1151 Type 2 diabetes mellitus with diabetic peripheral angiopathy without gangrene: Secondary | ICD-10-CM | POA: Diagnosis present

## 2013-05-22 DIAGNOSIS — R0602 Shortness of breath: Secondary | ICD-10-CM | POA: Insufficient documentation

## 2013-05-22 DIAGNOSIS — Z23 Encounter for immunization: Secondary | ICD-10-CM | POA: Insufficient documentation

## 2013-05-22 DIAGNOSIS — Z6841 Body Mass Index (BMI) 40.0 and over, adult: Secondary | ICD-10-CM

## 2013-05-22 DIAGNOSIS — R079 Chest pain, unspecified: Principal | ICD-10-CM | POA: Diagnosis present

## 2013-05-22 DIAGNOSIS — F329 Major depressive disorder, single episode, unspecified: Secondary | ICD-10-CM

## 2013-05-22 DIAGNOSIS — E785 Hyperlipidemia, unspecified: Secondary | ICD-10-CM | POA: Diagnosis present

## 2013-05-22 DIAGNOSIS — R51 Headache: Secondary | ICD-10-CM | POA: Insufficient documentation

## 2013-05-22 DIAGNOSIS — I10 Essential (primary) hypertension: Secondary | ICD-10-CM | POA: Diagnosis present

## 2013-05-22 DIAGNOSIS — Z720 Tobacco use: Secondary | ICD-10-CM | POA: Diagnosis present

## 2013-05-22 DIAGNOSIS — F332 Major depressive disorder, recurrent severe without psychotic features: Secondary | ICD-10-CM

## 2013-05-22 DIAGNOSIS — R45851 Suicidal ideations: Secondary | ICD-10-CM | POA: Insufficient documentation

## 2013-05-22 DIAGNOSIS — E1165 Type 2 diabetes mellitus with hyperglycemia: Secondary | ICD-10-CM

## 2013-05-22 DIAGNOSIS — Z79899 Other long term (current) drug therapy: Secondary | ICD-10-CM | POA: Insufficient documentation

## 2013-05-22 DIAGNOSIS — J45909 Unspecified asthma, uncomplicated: Secondary | ICD-10-CM | POA: Insufficient documentation

## 2013-05-22 DIAGNOSIS — E119 Type 2 diabetes mellitus without complications: Secondary | ICD-10-CM | POA: Insufficient documentation

## 2013-05-22 DIAGNOSIS — Z91199 Patient's noncompliance with other medical treatment and regimen due to unspecified reason: Secondary | ICD-10-CM | POA: Insufficient documentation

## 2013-05-22 HISTORY — DX: Sleep apnea, unspecified: G47.30

## 2013-05-22 LAB — CBC
Hemoglobin: 12.6 g/dL (ref 12.0–15.0)
MCH: 24.6 pg — ABNORMAL LOW (ref 26.0–34.0)
MCHC: 34.1 g/dL (ref 30.0–36.0)
Platelets: 322 10*3/uL (ref 150–400)
RBC: 5.12 MIL/uL — ABNORMAL HIGH (ref 3.87–5.11)

## 2013-05-22 LAB — BASIC METABOLIC PANEL
CO2: 25 mEq/L (ref 19–32)
Calcium: 9.3 mg/dL (ref 8.4–10.5)
GFR calc non Af Amer: 67 mL/min — ABNORMAL LOW (ref >90)
Glucose, Bld: 253 mg/dL — ABNORMAL HIGH (ref 70–99)
Potassium: 3.2 mEq/L — ABNORMAL LOW (ref 3.5–5.1)
Sodium: 135 mEq/L (ref 135–145)

## 2013-05-22 LAB — POCT I-STAT TROPONIN I: Troponin i, poc: 0 ng/mL (ref 0.00–0.08)

## 2013-05-22 LAB — PRO B NATRIURETIC PEPTIDE: Pro B Natriuretic peptide (BNP): 160.5 pg/mL — ABNORMAL HIGH (ref 0–125)

## 2013-05-22 MED ORDER — SODIUM CHLORIDE 0.9 % IV SOLN: INTRAVENOUS | Status: DC

## 2013-05-22 MED ORDER — ONDANSETRON HCL 4 MG/2ML IJ SOLN
4.0000 mg | Freq: Four times a day (QID) | INTRAMUSCULAR | Status: DC | PRN
  Administered 2013-05-26: 4 mg via INTRAVENOUS
  Filled 2013-05-22: qty 2

## 2013-05-22 MED ORDER — AMLODIPINE BESYLATE 5 MG PO TABS
5.0000 mg | ORAL_TABLET | Freq: Every day | ORAL | Status: DC
  Administered 2013-05-23: 5 mg via ORAL
  Filled 2013-05-22: qty 1

## 2013-05-22 MED ORDER — ACETAMINOPHEN 325 MG PO TABS
650.0000 mg | ORAL_TABLET | Freq: Four times a day (QID) | ORAL | Status: DC | PRN
  Administered 2013-05-23 (×3): 650 mg via ORAL
  Filled 2013-05-22 (×3): qty 2

## 2013-05-22 MED ORDER — HYDRALAZINE HCL 20 MG/ML IJ SOLN: 5.0000 mg | INTRAMUSCULAR | Status: DC | PRN

## 2013-05-22 MED ORDER — ASPIRIN EC 81 MG PO TBEC
81.0000 mg | DELAYED_RELEASE_TABLET | Freq: Every day | ORAL | Status: DC
  Administered 2013-05-23 – 2013-05-26 (×4): 81 mg via ORAL
  Filled 2013-05-22 (×4): qty 1

## 2013-05-22 MED ORDER — ALBUTEROL SULFATE (5 MG/ML) 0.5% IN NEBU
5.0000 mg | INHALATION_SOLUTION | Freq: Once | RESPIRATORY_TRACT | Status: AC
  Administered 2013-05-22: 5 mg via RESPIRATORY_TRACT
  Filled 2013-05-22: qty 1

## 2013-05-22 MED ORDER — METOPROLOL TARTRATE 25 MG PO TABS
25.0000 mg | ORAL_TABLET | Freq: Two times a day (BID) | ORAL | Status: DC
  Filled 2013-05-22 (×2): qty 1

## 2013-05-22 MED ORDER — ALUM & MAG HYDROXIDE-SIMETH 200-200-20 MG/5ML PO SUSP: 30.0000 mL | Freq: Four times a day (QID) | ORAL | Status: DC | PRN

## 2013-05-22 MED ORDER — SODIUM CHLORIDE 0.9 % IJ SOLN
3.0000 mL | Freq: Two times a day (BID) | INTRAMUSCULAR | Status: DC
  Administered 2013-05-23 – 2013-05-25 (×7): 3 mL via INTRAVENOUS

## 2013-05-22 MED ORDER — AMLODIPINE BESYLATE 5 MG PO TABS
5.0000 mg | ORAL_TABLET | Freq: Once | ORAL | Status: DC
  Filled 2013-05-22: qty 1

## 2013-05-22 MED ORDER — IPRATROPIUM BROMIDE 0.02 % IN SOLN
0.5000 mg | Freq: Once | RESPIRATORY_TRACT | Status: AC
  Administered 2013-05-22: 0.5 mg via RESPIRATORY_TRACT
  Filled 2013-05-22: qty 2.5

## 2013-05-22 MED ORDER — ATORVASTATIN CALCIUM 20 MG PO TABS
20.0000 mg | ORAL_TABLET | Freq: Every day | ORAL | Status: DC
  Administered 2013-05-23 – 2013-05-26 (×4): 20 mg via ORAL
  Filled 2013-05-22 (×4): qty 1

## 2013-05-22 MED ORDER — HYDROCHLOROTHIAZIDE 25 MG PO TABS
25.0000 mg | ORAL_TABLET | Freq: Once | ORAL | Status: AC
  Administered 2013-05-22: 25 mg via ORAL
  Filled 2013-05-22: qty 1

## 2013-05-22 MED ORDER — HYDROCHLOROTHIAZIDE 25 MG PO TABS
25.0000 mg | ORAL_TABLET | Freq: Every day | ORAL | Status: DC
  Administered 2013-05-23 – 2013-05-26 (×4): 25 mg via ORAL
  Filled 2013-05-22 (×4): qty 1

## 2013-05-22 MED ORDER — NITROGLYCERIN 0.4 MG SL SUBL: 0.4000 mg | SUBLINGUAL_TABLET | SUBLINGUAL | Status: DC | PRN

## 2013-05-22 MED ORDER — HEPARIN SODIUM (PORCINE) 5000 UNIT/ML IJ SOLN
5000.0000 [IU] | Freq: Three times a day (TID) | INTRAMUSCULAR | Status: DC
  Administered 2013-05-23 – 2013-05-26 (×9): 5000 [IU] via SUBCUTANEOUS
  Filled 2013-05-22 (×13): qty 1

## 2013-05-22 MED ORDER — METOPROLOL TARTRATE 25 MG PO TABS
25.0000 mg | ORAL_TABLET | Freq: Once | ORAL | Status: AC
  Administered 2013-05-22: 25 mg via ORAL
  Filled 2013-05-22: qty 1

## 2013-05-22 MED ORDER — ONDANSETRON HCL 4 MG PO TABS: 4.0000 mg | ORAL_TABLET | Freq: Four times a day (QID) | ORAL | Status: DC | PRN

## 2013-05-22 MED ORDER — INSULIN ASPART 100 UNIT/ML ~~LOC~~ SOLN
0.0000 [IU] | Freq: Three times a day (TID) | SUBCUTANEOUS | Status: DC
  Administered 2013-05-23: 3 [IU] via SUBCUTANEOUS
  Administered 2013-05-23: 5 [IU] via SUBCUTANEOUS
  Administered 2013-05-23 – 2013-05-24 (×2): 8 [IU] via SUBCUTANEOUS
  Administered 2013-05-24 – 2013-05-25 (×3): 5 [IU] via SUBCUTANEOUS
  Administered 2013-05-25 (×2): 8 [IU] via SUBCUTANEOUS

## 2013-05-22 MED ORDER — NITROGLYCERIN 0.4 MG SL SUBL
0.4000 mg | SUBLINGUAL_TABLET | SUBLINGUAL | Status: DC | PRN
  Administered 2013-05-22: 0.4 mg via SUBLINGUAL

## 2013-05-22 MED ORDER — ACETAMINOPHEN 650 MG RE SUPP: 650.0000 mg | Freq: Four times a day (QID) | RECTAL | Status: DC | PRN

## 2013-05-22 MED ORDER — SENNOSIDES-DOCUSATE SODIUM 8.6-50 MG PO TABS
1.0000 | ORAL_TABLET | Freq: Every evening | ORAL | Status: DC | PRN
  Filled 2013-05-22: qty 1

## 2013-05-22 NOTE — H&P (Signed)
[facility] Admission History and Physical Service Pager: [phone]  [name] Medical record number: [id number] Date of birth: [birth year] Age: 42 y.o. Gender: female  Primary Care Provider: Gaspar Bidding, DO Consultants: none Code Status: Full  Chief Complaint: chest pain, high blood pressure  Assessment and Plan: Diamond Rice is a 42 y.o. female presenting with chest pain and high blood pressure. PMH is significant for HTN, DM, HLD, previous admissions for chest pain.  # Chest pain: ACS rule out workup given history of HTN, HLD, DM, tobacco use and family history of premature cardiac death in mother (age 63). EKG unchanged from old (inverted T waves in I, V5, V6), i-STAT troponins negative, bmet with potassium 3.2 and glucose 253, BNP 160.5. CXR shows stable cardiomegaly, no acute process. - Admit to telemetry, attending Dr. Mauricio Po - Cycle troponins x 3 - Risk strat: A1c, TSH, Lipid panel - Repeat EKG and Cmet/CBC in AM - IVF KVO - Request records for previous hospitalization in [facility] in AM. (Request ordered in the ED, will follow up medical records)  # Hypertension: has been off of BP meds for over a month due to financial constraints. Restarted amlodipine, hctz, metoprolol in ED but BP still elevated to 190-200s/90-100s. - Continue amlodipin 5mg , HCTZ 25mg , metoprolol 25mg  BID - Hydralazine PRN for SBP >200 and DBP >120 - Case management for medication assistance  # Asthma: currently breathing fine, was wheezing in ED earlier and given albuterol x 1. No complaints of SOB and saturating 98-100% on RA. - Albuterol mdi PRN  # Right red eye: unclear etiology, currently not complaining of any changes in vision and acuity normal. Denies trauma or rubbing to area.  - Monitor for worsening acuity, swelling - If worsens consider consult to optho  # Social: currently homeless, cannot afford medications - CSW for tobacco, homelessness  and other financial resources - Case management seen in ED, to give [facility] letter and additional resources to help with medications as outpatient  # HLD: lipid panel per above - Continue home atorvastatin 20mg   # DM: A1c per above. Metformin, insulin (lantus) at home but does not use it. - CBG 4x daily AC and HS - Moderate SSI - Add Lantus if needed  FEN/GI: kvo, Heart Healthy diet Prophylaxis: heparin sq  Disposition: admit to telemetry  History of Present Illness: Diamond Rice is a 42 y.o. female presenting with chest pain and high blood pressure. First noted a right sided headache a few days ago, located behind right eye. Noted chest pain earlier this evening, located primarily in center of chest that she says is like a pressure and sharp. At this time her friend measured her blood pressure and it was "high". She has not taken her BP meds in over a month because she can't afford them. Chest pain started when she was sitting down, was initially constant but now it "comes and goes" since she has been in the ED. Describes the chest pain as "heavy and shooting" pain. Does not relate it to exertion. She was nauseas earlier with vomiting x 1 (no hematemesis), had some sweating. She is not currently SOB while in ED bed but was while walking to bathroom. She received albuterol x 1 while in ED. She denies any vision changes, says it is not blurry, not seeing spots, can see fine. She denies fevers/chills, diarrhea/constipation, voiding without difficulty. She says she was admitted to [facility] main around June/July and was in  the ICU for about 6 days, but can't explain exactly what for.   Of note she is concerned about her current health and heart because her mother died of cardiac causes at age 4.  Review Of Systems: Per HPI with the following additions: none Otherwise 12 point review of systems was performed and was unremarkable.  Patient Active Problem List   Diagnosis Date Noted  . Morbid obesity  with BMI of 45.0-49.9, adult 04/25/2012  . Chest pain 04/17/2012  . Hypertension 04/17/2012  . DM (diabetes mellitus), type 2, uncontrolled 04/17/2012  . Hyperlipidemia 04/17/2012  . Tobacco use 04/17/2012   Past Medical History: Past Medical History  Diagnosis Date  . Hypertension   . Type II diabetes mellitus   . Anginal pain   . High cholesterol   . Shortness of breath 04/17/2012    "all the time"  . Headache(784.0) 04/17/2012    "~ qod; lately waking up in am w/one"  . Migraines 04/17/2012  . Chronic lower back pain 04/17/2012    "just got over some; catched when I walked"  . Depression   . Anxiety    Past Surgical History: Past Surgical History  Procedure Laterality Date  . Cardiac catheterization  ~ 2011   Social History: History  Substance Use Topics  . Smoking status: Current Some Day Smoker -- 0.08 packs/day for 10 years    Types: Cigarettes  . Smokeless tobacco: Never Used  . Alcohol Use: Yes     Comment: 04/17/2012 "< 1 can of beer once or twice/month"   Additional social history:Recently started smoking again due to stressors including her sister being diagnosed with cancer. 1 pack every few days Denies alcohol and illicit drug use. Currently homeless and jobless, moving around different places to stay Please also refer to relevant sections of EMR.  Family History: No family history on file. Allergies and Medications: Allergies  Allergen Reactions  . Morphine And Related Hives   No current facility-administered medications on file prior to encounter.   Current Outpatient Prescriptions on File Prior to Encounter  Medication Sig Dispense Refill  . Blood Glucose Monitoring Suppl (RELION PRIME MONITOR) DEVI 1 each by Does not apply route 2 (two) times daily.  1 Device  0  . RELION LANCETS STANDARD 21G MISC 1 each by Does not apply route 2 (two) times daily.  200 each  0    Objective: BP 182/107  Pulse 97  Temp(Src) 98.5 F (36.9 C) (Oral)  Resp 15   SpO2 98%  LMP 05/18/2013 Exam: General: NAD, laying in bed HEENT: PERRL, EOMI. MMM. Lateral right sclera injected, conjunctiva mildly edematous. Bilateral upper, outer lid swelling. No exopthalmos. Acuity normal and no field deficits. Fundoscopic exam limited to non-dilated pupil, but fundi and vessels normal appearing bilaterally Cardiovascular: borderline tachy, regular rhythm. Normal heart sounds, no murmurs appreciated Respiratory: CTAB, effort normal Abdomen: soft, obese, nontender, bowel sounds present Extremities: no edema in legs, 2+ DP pulses bilaterally. Mild edema and pain of left wrist at site of prior IV.  Skin: warm and dry Neuro: A+O. CN grossly normal, grip strength 5/5 bilaterally,   Labs and Imaging: CBC BMET   Recent Labs Lab 05/22/13 2109  WBC 11.0*  HGB 12.6  HCT 36.9  PLT 322    Recent Labs Lab 05/22/13 2109  NA 135  K 3.2*  CL 96  CO2 25  BUN 14  CREATININE 1.03  GLUCOSE 253*  CALCIUM 9.3     i-STAT troponin: 0.00  Pro BNP: 160.5  2v CXR:  FINDINGS:  There is stable cardiomegaly and aortic tortuosity. Linear scarring  in the lingula is unchanged. The lungs are otherwise clear. There is  no pleural effusion or pneumothorax. The osseous structures appear  normal.  IMPRESSION:  Stable cardiomegaly and lingular scarring. No acute cardiopulmonary  process.   Tawni Carnes, MD 05/22/2013, 11:14 PM PGY-1, [facility] FPTS Intern pager: [phone], text pages welcome  PGY-3 Addendum: I have seen and examined patient. I agree with Dr. Laban Emperor H&P above with some additions noted in purple. Overall, patient with elevated BP due to decreased medication adherence at home. She is having some intermittent chest pain so given her risk factors we will do ACS rule out. Patient updated on plan at admission.  Nilay Mangrum M. Alamin Mccuiston, M.D. 05/23/2013 12:58 AM

## 2013-05-22 NOTE — ED Notes (Signed)
Tried to call report but was put on hold.

## 2013-05-22 NOTE — ED Notes (Signed)
Pt had 325mg  of aspirn.

## 2013-05-22 NOTE — Progress Notes (Signed)
ED CM consulted by [facility] RN regarding patient medication assistance. In to speak with patient, Pt reports she is unemployed and homeless, and has not been able to afford medication. Pt presented to ED with elevated B/P. Discussed MATCH program, patient is eligible for MATCH. Also discussed the [facility] and other [facility] [employer]. Pt given written resource information. Pt was enrolled in [facility] program and match letter printer and given to patient.  Pt verbalized understanding and agrees with Plan. No further CM needs identified.

## 2013-05-22 NOTE — ED Notes (Signed)
Pt in X ray

## 2013-05-22 NOTE — Progress Notes (Signed)
Pt given MATCH letter, Pt re-evaluated for admission. CM on Unit will follow up.

## 2013-05-22 NOTE — ED Notes (Signed)
Pt brought to ED by EMS with headache which started 2 days ago and chest pain from yesterday.As per EMS pt ran out of all her medications and can not afford to refill.Zofron 4mg  iv given at 1930 by EMS.

## 2013-05-22 NOTE — ED Provider Notes (Signed)
CSN: [id number]     Arrival date & time 05/22/13  1934 History   First MD Initiated Contact with Patient 05/22/13 1937     Chief Complaint  Patient presents with  . Headache   (Consider location/radiation/quality/duration/timing/severity/associated sxs/prior Treatment) HPI Comments: 42 year old female with a past medical history of hypertension, diabetes, migraines, depression, anxiety and chronic low back pain presents to the emergency department complaining of chest pain beginning last night while she was just sitting in her home. Pain located in the center of her chest described as sharp and pressure, non-radiating. Pain has been constant since onset, she has not tried any alleviating factors. Nothing in specific makes the pain worse. Admits to associated shortness of breath over the past couple of days. Shortness of breath is also constant, however she is relating this to her being overweight. Also complaining of a headache which began 2 days ago, worse on the right side, occasionally feeling pressure behind her eye. Denies visual disturbance. She has not had any alleviating factors for her headache. Today while she was sitting at her home she began to feel nauseous, vomited once and was sweating. States this feels similar to an episode of chest pain that she has had in the past. She had a friend check her blood pressure which was elevated. States she has been out of her medications for about one month and cannot afford to refill them. Denies fever, chills, cough, abdominal pain, lightheadedness, dizziness or confusion. EMS gave 4 mg IV Zofran which helped her nausea. She also took four 81 mg aspirin just before EMS arrived at her home. Mom died at age 1 of heart disease.  Patient is a 42 y.o. female presenting with headaches. The history is provided by the patient and the EMS personnel.  Headache Associated symptoms: eye pain, nausea and vomiting   Associated symptoms: no abdominal pain, no cough,  no fever, no neck pain, no neck stiffness and no photophobia     Past Medical History  Diagnosis Date  . Hypertension   . Type II diabetes mellitus   . Anginal pain   . High cholesterol   . Shortness of breath 04/17/2012    "all the time"  . Headache(784.0) 04/17/2012    "~ qod; lately waking up in am w/one"  . Migraines 04/17/2012  . Chronic lower back pain 04/17/2012    "just got over some; catched when I walked"  . Depression   . Anxiety    Past Surgical History  Procedure Laterality Date  . Cardiac catheterization  ~ 2011   No family history on file. History  Substance Use Topics  . Smoking status: Current Some Day Smoker -- 0.08 packs/day for 10 years    Types: Cigarettes  . Smokeless tobacco: Never Used  . Alcohol Use: Yes     Comment: 04/17/2012 "< 1 can of beer once or twice/month"   OB History   Grav Para Term Preterm Abortions TAB SAB Ect Mult Living                 Review of Systems  Constitutional: Positive for diaphoresis. Negative for fever and chills.  HENT: Negative for neck pain and neck stiffness.   Eyes: Positive for pain. Negative for photophobia, redness and visual disturbance.  Respiratory: Positive for shortness of breath. Negative for cough and wheezing.   Cardiovascular: Positive for chest pain. Negative for leg swelling.  Gastrointestinal: Positive for nausea and vomiting. Negative for abdominal pain.  Neurological: Positive for  headaches. Negative for weakness.  Psychiatric/Behavioral: Negative for confusion.  All other systems reviewed and are negative.    Allergies  Morphine and related  Home Medications   Current Outpatient Rx  Name  Route  Sig  Dispense  Refill  . albuterol (PROVENTIL HFA;VENTOLIN HFA) 108 (90 BASE) MCG/ACT inhaler   Inhalation   Inhale 2 puffs into the lungs every 6 (six) hours as needed. Shortness of breath         . amLODipine (NORVASC) 10 MG tablet   Oral   Take 1 tablet (10 mg total) by mouth daily.   90  tablet   3   . amLODipine (NORVASC) 10 MG tablet   Oral   Take 1 tablet (10 mg total) by mouth daily.   30 tablet   0   . Blood Glucose Monitoring Suppl (RELION PRIME MONITOR) DEVI   Does not apply   1 each by Does not apply route 2 (two) times daily.   1 Device   0   . glimepiride (AMARYL) 2 MG tablet   Oral   Take 1 tablet (2 mg total) by mouth daily before breakfast. May take before lunch if does not eat breakfast.   30 tablet   3     Please inform patient that she must take just befo ...   . glimepiride (AMARYL) 2 MG tablet   Oral   Take 1 tablet (2 mg total) by mouth daily before breakfast. May take before lunch if patient does not eat breakfast   30 tablet   0   . EXPIRED: hydrochlorothiazide (HYDRODIURIL) 25 MG tablet   Oral   Take 1 tablet (25 mg total) by mouth daily.   30 tablet   3   . HYDROcodone-acetaminophen (NORCO/VICODIN) 5-325 MG per tablet   Oral   Take 2 tablets by mouth every 4 (four) hours as needed for pain.   12 tablet   0   . ibuprofen (ADVIL,MOTRIN) 200 MG tablet   Oral   Take 600 mg by mouth every 6 (six) hours as needed for pain.          Marland Kitchen lisinopril (PRINIVIL,ZESTRIL) 20 MG tablet   Oral   Take 2 tablets (40 mg total) by mouth daily.   30 tablet   0   . lisinopril (PRINIVIL,ZESTRIL) 40 MG tablet   Oral   Take 1 tablet (40 mg total) by mouth daily.   90 tablet   3   . metFORMIN (GLUCOPHAGE) 500 MG tablet   Oral   Take 2 tablets (1,000 mg total) by mouth 2 (two) times daily with a meal.   30 tablet   0   . metoprolol (LOPRESSOR) 100 MG tablet      Take 25mg  po bid   30 tablet   0   . EXPIRED: metoprolol tartrate (LOPRESSOR) 25 MG tablet   Oral   Take 1 tablet (25 mg total) by mouth 2 (two) times daily.   60 tablet   3   . RELION LANCETS STANDARD 21G MISC   Does not apply   1 each by Does not apply route 2 (two) times daily.   200 each   0    BP 197/112  Temp(Src) 98.5 F (36.9 C) (Oral)  Resp 12  SpO2  100% Physical Exam  Nursing note and vitals reviewed. Constitutional: She is oriented to person, place, and time. She appears well-developed and well-nourished. No distress. Nasal cannula in place.  Obese  HENT:  Head: Normocephalic and atraumatic.  Mouth/Throat: Oropharynx is clear and moist.  Eyes: Conjunctivae and EOM are normal. Pupils are equal, round, and reactive to light.  Right eyelid tender to palpation, mild swelling, no erythema.  Neck: Normal range of motion. Neck supple.  Cardiovascular: Normal rate, regular rhythm, normal heart sounds, intact distal pulses and normal pulses.   No extremity edema.  Pulmonary/Chest: Effort normal. No respiratory distress. She has no decreased breath sounds. She has wheezes (scattered expiratory). She has no rhonchi. She has no rales. She exhibits tenderness.    Abdominal: Soft. Bowel sounds are normal. There is no tenderness.  Musculoskeletal: Normal range of motion. She exhibits no edema.  Neurological: She is alert and oriented to person, place, and time. She has normal strength. No sensory deficit.  Skin: Skin is warm and dry. She is not diaphoretic.  Psychiatric: She has a normal mood and affect. Her behavior is normal.    ED Course  Procedures (including critical care time) Labs Review Labs Reviewed  CBC - Abnormal; Notable for the following:    WBC 11.0 (*)    RBC 5.12 (*)    MCV 72.1 (*)    MCH 24.6 (*)    All other components within normal limits  BASIC METABOLIC PANEL - Abnormal; Notable for the following:    Potassium 3.2 (*)    Glucose, Bld 253 (*)    GFR calc non Af Amer 67 (*)    GFR calc Af Amer 77 (*)    All other components within normal limits  PRO B NATRIURETIC PEPTIDE - Abnormal; Notable for the following:    Pro B Natriuretic peptide (BNP) 160.5 (*)    All other components within normal limits  POCT I-STAT TROPONIN I    Date: 05/22/2013  Rate: 89  Rhythm: normal sinus rhythm  QRS Axis: left  Intervals:  normal  ST/T Wave abnormalities: nonspecific T wave changes  Conduction Disutrbances:none  Narrative Interpretation: Sinus rhythm, LVH, nonspecific T wave changes lateral leads, no significant changes since EKG obtained on 04/17/2012.  Old EKG Reviewed: unchanged   Imaging Review Dg Chest 2 View  05/22/2013   CLINICAL DATA:  Headaches with chronic shortness of breath. Mid chest pain. History of hypertension and diabetes.  EXAM: CHEST  2 VIEW  COMPARISON:  04/17/2012.  FINDINGS: There is stable cardiomegaly and aortic tortuosity. Linear scarring in the lingula is unchanged. The lungs are otherwise clear. There is no pleural effusion or pneumothorax. The osseous structures appear normal.  IMPRESSION: Stable cardiomegaly and lingular scarring. No acute cardiopulmonary process.   Electronically Signed   By: Roxy Horseman   On: 05/22/2013 20:35    MDM   1. Chest pain   2. Shortness of breath     Patient with chest pain, shortness of breath and headaches. History of hypertension, hypertensive at 197/112. Admits to not taking her medications for the past month as she cannot afford them. Chest pain reproducible on exam. Expiratory wheezing noted, duo neb given. Patient states during her DuoNeb treatment she developed a new left-sided chest pain for a few seconds radiating down her left arm which has since subsided. I am not able to reproduce this pain on exam. States she is still short of breath, expiratory wheezes still present. He shouldn't episode back in June of lightheadedness and dizziness when she was admitted to the ICU, told she had something wrong with her heart, however unable to provide any more information on this. Given  the nature of patient's symptoms, strong family history of heart disease and hypertension, patient will be admitted for further care. Patient also evaluated by my attending Dr. Romeo Apple who agrees with plan of care. Admission accepted by family practice. Hypertension  medications that she was on prior given in the emergency department.  Trevor Mace, PA-C 05/22/13 2246

## 2013-05-23 ENCOUNTER — Encounter (HOSPITAL_COMMUNITY): Payer: Self-pay | Admitting: *Deleted

## 2013-05-23 ENCOUNTER — Observation Stay (HOSPITAL_COMMUNITY): Payer: Medicaid Other

## 2013-05-23 LAB — RAPID URINE DRUG SCREEN, HOSP PERFORMED
Barbiturates: NOT DETECTED
Benzodiazepines: NOT DETECTED
Cocaine: NOT DETECTED
Opiates: NOT DETECTED
Tetrahydrocannabinol: POSITIVE — AB

## 2013-05-23 LAB — COMPREHENSIVE METABOLIC PANEL
AST: 12 U/L (ref 0–37)
Albumin: 3.1 g/dL — ABNORMAL LOW (ref 3.5–5.2)
BUN: 13 mg/dL (ref 6–23)
Creatinine, Ser: 1.09 mg/dL (ref 0.50–1.10)
GFR calc Af Amer: 72 mL/min — ABNORMAL LOW (ref >90)
Glucose, Bld: 268 mg/dL — ABNORMAL HIGH (ref 70–99)
Total Bilirubin: 0.2 mg/dL — ABNORMAL LOW (ref 0.3–1.2)
Total Protein: 7.4 g/dL (ref 6.0–8.3)

## 2013-05-23 LAB — GLUCOSE, CAPILLARY
Glucose-Capillary: 222 mg/dL — ABNORMAL HIGH (ref 70–99)
Glucose-Capillary: 166 mg/dL — ABNORMAL HIGH (ref 70–99)
Glucose-Capillary: 259 mg/dL — ABNORMAL HIGH (ref 70–99)
Glucose-Capillary: 241 mg/dL — ABNORMAL HIGH (ref 70–99)

## 2013-05-23 LAB — CBC
HCT: 37.5 % (ref 36.0–46.0)
Hemoglobin: 11.9 g/dL — ABNORMAL LOW (ref 12.0–15.0)
MCV: 73.5 fL — ABNORMAL LOW (ref 78.0–100.0)
Platelets: 294 10*3/uL (ref 150–400)
RBC: 5.1 MIL/uL (ref 3.87–5.11)
WBC: 10.9 10*3/uL — ABNORMAL HIGH (ref 4.0–10.5)

## 2013-05-23 LAB — LIPID PANEL
Cholesterol: 184 mg/dL (ref 0–200)
HDL: 42 mg/dL (ref >39)
Total CHOL/HDL Ratio: 4.4 RATIO
Triglycerides: 173 mg/dL — ABNORMAL HIGH (ref <150)

## 2013-05-23 LAB — TROPONIN I
Troponin I: <0.3 ng/mL (ref <0.30)
Troponin I: <0.3 ng/mL (ref <0.30)
Troponin I: <0.3 ng/mL (ref <0.30)

## 2013-05-23 LAB — TSH: TSH: 1.298 u[IU]/mL (ref 0.350–4.500)

## 2013-05-23 MED ORDER — INFLUENZA VAC SPLIT QUAD 0.5 ML IM SUSP
0.5000 mL | INTRAMUSCULAR | Status: AC
  Administered 2013-05-24: 0.5 mL via INTRAMUSCULAR
  Filled 2013-05-23: qty 0.5

## 2013-05-23 MED ORDER — AMLODIPINE BESYLATE 10 MG PO TABS
10.0000 mg | ORAL_TABLET | Freq: Every day | ORAL | Status: DC
  Administered 2013-05-24 – 2013-05-26 (×3): 10 mg via ORAL
  Filled 2013-05-23 (×3): qty 1

## 2013-05-23 MED ORDER — METOPROLOL TARTRATE 12.5 MG HALF TABLET
12.5000 mg | ORAL_TABLET | Freq: Two times a day (BID) | ORAL | Status: DC
  Administered 2013-05-23 – 2013-05-26 (×7): 12.5 mg via ORAL
  Filled 2013-05-23 (×8): qty 1

## 2013-05-23 MED ORDER — HYDRALAZINE HCL 20 MG/ML IJ SOLN
5.0000 mg | INTRAMUSCULAR | Status: DC | PRN
  Administered 2013-05-23: 5 mg via INTRAVENOUS
  Filled 2013-05-23: qty 1

## 2013-05-23 MED ORDER — LISINOPRIL 10 MG PO TABS
10.0000 mg | ORAL_TABLET | Freq: Every day | ORAL | Status: DC
  Administered 2013-05-23 – 2013-05-24 (×2): 10 mg via ORAL
  Filled 2013-05-23 (×2): qty 1

## 2013-05-23 MED ORDER — AMLODIPINE BESYLATE 5 MG PO TABS
5.0000 mg | ORAL_TABLET | Freq: Once | ORAL | Status: AC
  Administered 2013-05-23: 5 mg via ORAL
  Filled 2013-05-23: qty 1

## 2013-05-23 MED ORDER — POTASSIUM CHLORIDE CRYS ER 20 MEQ PO TBCR: 30.0000 meq | EXTENDED_RELEASE_TABLET | Freq: Two times a day (BID) | ORAL | Status: DC

## 2013-05-23 MED ORDER — NICOTINE 21 MG/24HR TD PT24
21.0000 mg | MEDICATED_PATCH | Freq: Every day | TRANSDERMAL | Status: DC
  Administered 2013-05-23 – 2013-05-25 (×3): 21 mg via TRANSDERMAL
  Filled 2013-05-23 (×4): qty 1

## 2013-05-23 MED ORDER — HYDROCODONE-ACETAMINOPHEN 7.5-325 MG PO TABS: 1.0000 | ORAL_TABLET | Freq: Four times a day (QID) | ORAL | Status: DC | PRN

## 2013-05-23 MED ORDER — TRAMADOL HCL 50 MG PO TABS
50.0000 mg | ORAL_TABLET | Freq: Once | ORAL | Status: AC
  Administered 2013-05-23: 50 mg via ORAL
  Filled 2013-05-23: qty 1

## 2013-05-23 MED ORDER — METOPROLOL TARTRATE 25 MG PO TABS: 25.0000 mg | ORAL_TABLET | Freq: Two times a day (BID) | ORAL | Status: DC

## 2013-05-23 MED ORDER — KETOROLAC TROMETHAMINE 15 MG/ML IJ SOLN: 15.0000 mg | Freq: Once | INTRAMUSCULAR | Status: DC

## 2013-05-23 MED ORDER — POTASSIUM CHLORIDE CRYS ER 20 MEQ PO TBCR
40.0000 meq | EXTENDED_RELEASE_TABLET | Freq: Two times a day (BID) | ORAL | Status: DC
  Administered 2013-05-23 – 2013-05-25 (×5): 40 meq via ORAL
  Filled 2013-05-23 (×6): qty 2

## 2013-05-23 MED ORDER — DIPHENHYDRAMINE HCL 25 MG PO CAPS: 25.0000 mg | ORAL_CAPSULE | Freq: Four times a day (QID) | ORAL | Status: DC | PRN

## 2013-05-23 MED ORDER — KETOROLAC TROMETHAMINE 15 MG/ML IJ SOLN
15.0000 mg | Freq: Once | INTRAMUSCULAR | Status: AC
  Administered 2013-05-23: 15 mg via INTRAVENOUS
  Filled 2013-05-23: qty 1

## 2013-05-23 NOTE — Progress Notes (Signed)
Utilization review completed.  

## 2013-05-23 NOTE — Progress Notes (Signed)
Family Medicine Teaching [facility] Admission History and Physical Service Pager: [phone]  [name] Medical record number: [id number] Date of birth: 1970-10-30 Age: 42 y.o. Gender: female  Primary Care Provider: Berline Chough, Yazaira Speas, DO  Pt is new to me as her PCP.  She has a complex medical history. Aphrodite's major active medical problems include: # HTN: Severe range, non compliant with medication regimen # Diabetes, HLD:  Poorly controlled,  # Tobacco Abuse & Polysubstance abuse:  Hx of cocaine use  Other Pertinent Med/Surg/Hosp History: #  Hospitalized in [facility] 8/14 for -   Other Care Providers include:  CARDIOLOGY  -   Follow up Issues:  SOCIAL SITUATION:   Homeless Medication acquisition & compliance    I appreciate the care of the FMTS team and look forward to seeing her in clinic.  Andrena Mews, DO

## 2013-05-23 NOTE — ED Provider Notes (Signed)
Medical screening examination/treatment/procedure(s) were conducted as a shared visit with non-physician practitioner(s) and myself.  I personally evaluated the patient during the encounter  I interviewed and examined the patient. Faint wheezing heard diffusely on lung exam. Cardiac exam wnl. Abdomen soft. Pt is hypertensive on my exam, mild ongoing chest heaviness. Has not been taking he rmeds since Aug '14. Will admit to [facility].   Junius Argyle, MD 05/23/13 1314

## 2013-05-23 NOTE — Progress Notes (Signed)
Attending Addendum  I examined the patient and discussed the assessment and plan with Dr. Waynetta Sandy. I have reviewed the note and agree.  Plan for BP control. CT head to eval headache and pain control. Appreciate cardiology recommendations.     Dessa Phi, MD FAMILY MEDICINE TEACHING SERVICE

## 2013-05-23 NOTE — Progress Notes (Signed)
Family Medicine Teaching Service Daily Progress Note Intern Pager: [phone]  [name] Medical record number: [id number] Date of birth: 1971/08/28 Age: 42 y.o. Gender: female  Primary Care Provider: Gaspar Bidding, DO Consultants: cardiology Code Status: Full  Pt Overview and Major Events to Date:   Assessment and Plan: Diamond Rice is a 42 y.o. female presenting with chest pain and high blood pressure. PMH is significant for HTN, DM, HLD, previous admissions for chest pain.   # Chest pain: ACS rule out workup given history of HTN, HLD, DM, tobacco use and family history of premature cardiac death in mother (age 49). EKG unchanged from old (inverted T waves in I, V5, V6), i-STAT troponins negative, bmet with potassium 3.2 and glucose 253, BNP 160.5. CXR shows stable cardiomegaly, no acute process. Has recent hospitalization in [facility] main with ICU stay. Had myoview in 03/2012 showed EF 56% and no reversible perfusion defects. - Cycle troponins, neg x 2 - Risk strat: pending A1c, TSH. Lipid panel (LDL 107) - EKG this AM appears unchanged, has T wave inversion I, II, V4-V6, LVH - Request records for previous hospitalization in [facility] in AM. (Request ordered in the ED, will follow up medical records)  - Cardiology consult appreciated given her risk factors and concern for need for further workup.  # Hypertension: has been off of BP meds for over a month due to financial constraints. Restarted amlodipine, hctz, metoprolol in ED but BP still elevated to 190-200s/90-100s.  - Hydralazine PRN for SBP >200 and DBP >120  - Case management for medication assistance  - This morning BP slightly improved, 177/96 - No hydralazine given overnight - Increase amlodipine to 10mg , continue HCTZ 25mg , start lisinopril 10mg , start metoprolol 12.5mg   # Hypokalemia: 3.2 last night, 2.9 this morning - Replete with Kdur 40 mEq x 2  # Asthma: currently breathing fine, was wheezing in ED  earlier and given albuterol x 1. No complaints of SOB and saturating 98-100% on RA.  - Albuterol mdi PRN   # Right red eye: unclear etiology, currently not complaining of any changes in vision and acuity normal. Denies trauma or rubbing to area.  - Monitor for worsening acuity, swelling  - If worsens consider consult to optho   # Social: currently homeless, cannot afford medications  - CSW for tobacco, homelessness and other financial resources  - Case management seen in ED, to give [facility] letter and additional resources to help with medications as outpatient   # HLD: lipid panel per above  - Continue home atorvastatin 20mg    # DM: A1c per above. Metformin, insulin (lantus) at home but does not use it.  - CBG 4x daily AC and HS  - Moderate SSI  - Add Lantus if needed   FEN/GI: kvo, Heart Healthy diet  Prophylaxis: heparin sq  Disposition: pending clinical course  Subjective:  Patient sleeping this morning. Says she feels slightly better than last night, headache still present behind right eye and now also has headache on front left. No complaints of chest pain, breathing is okay.  Objective: Temp:  [97.7 F (36.5 C)-98.5 F (36.9 C)] 98.1 F (36.7 C) (09/26 0452) Pulse Rate:  [79-97] 79 (09/26 0452) Resp:  [12-20] 20 (09/26 0452) BP: (173-197)/(96-117) 177/96 mmHg (09/26 0452) SpO2:  [98 %-100 %] 99 % (09/26 0452) Weight:  [248 lb 12.8 oz (112.855 kg)] 248 lb 12.8 oz (112.855 kg) (09/26 0452) Physical Exam: General: NAD, sleeping but easily awoken HEENT:  PERRL, EOMI, visual acuity intact. Right lateral sclera stili injected. Cardiovascular: RRR, normal heart sounds, no murmurs Respiratory: CTAB, effort normal Abdomen: soft, nontender, bowel sounds present Extremities: no edema, 2+ DP pulses bilaterally  Laboratory:  Recent Labs Lab 05/22/13 2109 05/23/13 0430  WBC 11.0* 10.9*  HGB 12.6 11.9*  HCT 36.9 37.5  PLT 322 294    Recent Labs Lab 05/22/13 2109  05/23/13 0430  NA 135 133*  K 3.2* 2.9*  CL 96 94*  CO2 25 27  BUN 14 13  CREATININE 1.03 1.09  CALCIUM 9.3 9.3  PROT  --  7.4  BILITOT  --  0.2*  ALKPHOS  --  80  ALT  --  9  AST  --  12  GLUCOSE 253* 268*   Lipid Panel     Component Value Date/Time   CHOL 184 05/23/2013 0430   TRIG 173* 05/23/2013 0430   HDL 42 05/23/2013 0430   CHOLHDL 4.4 05/23/2013 0430   VLDL 35 05/23/2013 0430   LDLCALC 107* 05/23/2013 0430   TSH, A1c pending Troponin neg x 2 Pro BNP 160.5  Imaging/Diagnostic Tests: 2v CXR: FINDINGS:  There is stable cardiomegaly and aortic tortuosity. Linear scarring  in the lingula is unchanged. The lungs are otherwise clear. There is  no pleural effusion or pneumothorax. The osseous structures appear  normal.  IMPRESSION:  Stable cardiomegaly and lingular scarring. No acute cardiopulmonary  process.   Tawni Carnes, MD 05/23/2013, 6:24 AM PGY-1, [facility] Family Medicine FPTS Intern pager: [phone], text pages welcome

## 2013-05-23 NOTE — Care Management Note (Signed)
    Page 1 of 2   05/27/2013     8:35:43 AM   CARE MANAGEMENT NOTE 05/27/2013  Patient:  KAZOUA, GOSSEN   Account Number:  [id number]  Date Initiated:  05/23/2013  Documentation initiated by:  Donn Pierini  Subjective/Objective Assessment:   Pt admitted with c/p     Action/Plan:   PTA pt was homeless- staying in ?[place]   Anticipated DC Date:  05/24/2013   Anticipated DC Plan:  HOME/SELF CARE  In-house referral  Clinical Social Worker      DC Associate Professor  CM consult  MATCH Program      Choice offered to / List presented to:             Status of service:  Completed, signed off Medicare Important Message given?   (If response is "NO", the following Medicare IM given date fields will be blank) Date Medicare IM given:   Date Additional Medicare IM given:    Discharge Disposition:  PSYCHIATRIC HOSPITAL  Per UR Regulation:  Reviewed for med. necessity/level of care/duration of stay  If discussed at Long Length of Stay Meetings, dates discussed:    Comments:  05/27/13- 0800- Donn Pierini RN, BSN [phone] Pt d/c'd to [facility]- email sent to [facility] at [facility] regarding pt's need for new [facility] letter when pt is discharged from [facility].  05/26/13- 1200- Donn Pierini RN, BSN [phone] Spoke with Maddie with [facility]- pt would not be Medicaid eligible-and last had Medicaid in 2013 -has since been inactive- when she was no longer eligible. Per notes in the chart pt has been established at [facility] with Dr. Berline Chough. Plan at this time is for pt to go to [facility] for f/u with inpatient psychiatric treatment- CSW following   05/23/13- 1600- Donn Pierini RN, BSN [phone] Spoke with pt at bedside regarding d/c needs- per conversation pt states she is homeless- has been staying here and there sometimes out on streets. Consult placed to CSW for shelter resources- pt already has info on [facility] and some other community resources given to her by the ED CM. Pt also already has MATCH letter for medication  assistance and understands that this program is a $3 copay per prescription (she uses [facility]). Pt still needs a PCP- it would be of benefit for pt to be set up with the [facility] outpt clinic- but if not can be set up with the [facility]- wellness center- please contact Weekend CM if pt needs to be assisted with this.

## 2013-05-23 NOTE — H&P (Signed)
FMTS Attending Admit Note Patient seen and examined by me, reviewed resident note and I agree with Dr Algis Downs assess/plan, with the following additions: Patient with strong family hx of CAD (mother died Mi at 26), as well as tobacco use, HTN off antihypertensive medication, presenting for central chest pain and markedly hypertensive.  At time of my exam this morning, she denies chest pain or dyspnea.  She reports that she has recently resumed cigarette smoking but denies alcohol or other substances.  Assess/PLan: Agree with serial cardiac enzymes and ECG this morning; ASA, resume amlodipine and HCTZ.  In March 2011 the patient had a UDS that was positive for cocaine/THC; would repeat now, especially in light of her prescription for metoprolol.  Would involve Cardiology to help triage need for additional testing in this high-risk patient. Paula Compton, MD

## 2013-05-23 NOTE — Consult Note (Signed)
CONSULT NOTE  Date: 05/23/2013               Patient Name:  Diamond Rice MRN: [id number]  DOB: 1971/01/29 Age / Sex: 42 y.o., female        PCP: Gaspar Bidding Primary Cardiologist: none            Referring Physician: Rodman Pickle, MD              Reason for Consult: CP           History of Present Illness: Patient is a 42 y.o. female with a PMHx of uncontrolled HTN, untreated Diabetes mellitus, untreated hyperlipidemia, who was admitted to [facility] on 05/22/2013 for evaluation of atypical cp.   She has been having frequent admissions to the emergency room for episodes of chest pain. She is ruled out during these admissions.  She's been seen by the family practice teaching service.  The patient will not consistently take her medications. She states that she doesn't have any money for medications. Despite this, she has had several possible drug screens  She has long-standing diabetes mellitus but does not take her insulin as directed. She has a history of morbid obesity.     Medications: Outpatient medications: Prescriptions prior to admission  Medication Sig Dispense Refill  . Blood Glucose Monitoring Suppl (RELION PRIME MONITOR) DEVI 1 each by Does not apply route 2 (two) times daily.  1 Device  0  . RELION LANCETS STANDARD 21G MISC 1 each by Does not apply route 2 (two) times daily.  200 each  0    Current medications: Current Facility-Administered Medications  Medication Dose Route Frequency Provider Last Rate Last Dose  . 0.9 %  sodium chloride infusion   Intravenous Continuous Amber Nydia Bouton, MD      . acetaminophen (TYLENOL) tablet 650 mg  650 mg Oral Q6H PRN Hilarie Fredrickson, MD   650 mg at 05/23/13 0801   Or  . acetaminophen (TYLENOL) suppository 650 mg  650 mg Rectal Q6H PRN Amber Nydia Bouton, MD      . alum & mag hydroxide-simeth (MAALOX/MYLANTA) 200-200-20 MG/5ML suspension 30 mL  30 mL Oral Q6H PRN Amber Nydia Bouton, MD      . Melene Muller ON 05/24/2013]  amLODipine (NORVASC) tablet 10 mg  10 mg Oral Daily Wenda Low, MD      . aspirin EC tablet 81 mg  81 mg Oral Daily Hilarie Fredrickson, MD   81 mg at 05/23/13 1047  . atorvastatin (LIPITOR) tablet 20 mg  20 mg Oral q1800 Tawni Carnes, MD      . heparin injection 5,000 Units  5,000 Units Subcutaneous Q8H Hilarie Fredrickson, MD   5,000 Units at 05/23/13 [phone]  . hydrALAZINE (APRESOLINE) injection 5 mg  5 mg Intravenous Q4H PRN Amber Nydia Bouton, MD      . hydrochlorothiazide (HYDRODIURIL) tablet 25 mg  25 mg Oral Daily Amber Nydia Bouton, MD   25 mg at 05/23/13 1047  . [START ON 05/24/2013] influenza vac split quadrivalent PF (FLUARIX) injection 0.5 mL  0.5 mL Intramuscular Tomorrow-1000 Barbaraann Barthel, MD      . insulin aspart (novoLOG) injection 0-15 Units  0-15 Units Subcutaneous TID WC Amber Nydia Bouton, MD   5 Units at 05/23/13 0844  . lisinopril (PRINIVIL,ZESTRIL) tablet 10 mg  10 mg Oral Daily Wenda Low, MD      . metoprolol tartrate (LOPRESSOR) tablet 12.5 mg  12.5 mg Oral  BID Wenda Low, MD      . nicotine (NICODERM CQ - dosed in mg/24 hours) patch 21 mg  21 mg Transdermal Daily Wenda Low, MD      . nitroGLYCERIN (NITROSTAT) SL tablet 0.4 mg  0.4 mg Sublingual Q5 min PRN Amber Nydia Bouton, MD      . ondansetron (ZOFRAN) tablet 4 mg  4 mg Oral Q6H PRN Amber Nydia Bouton, MD       Or  . ondansetron (ZOFRAN) injection 4 mg  4 mg Intravenous Q6H PRN Amber M Hairford, MD      . potassium chloride SA (K-DUR,KLOR-CON) CR tablet 40 mEq  40 mEq Oral BID Tawni Carnes, MD   40 mEq at 05/23/13 1047  . senna-docusate (Senokot-S) tablet 1 tablet  1 tablet Oral QHS PRN Amber M Hairford, MD      . sodium chloride 0.9 % injection 3 mL  3 mL Intravenous Q12H Amber Nydia Bouton, MD   3 mL at 05/23/13 1047     Allergies  Allergen Reactions  . Morphine And Related Hives     Past Medical History  Diagnosis Date  . Hypertension   . Type II diabetes mellitus   . Anginal pain   . High cholesterol   .  Shortness of breath 04/17/2012    "all the time"  . Headache(784.0) 04/17/2012    "~ qod; lately waking up in am w/one"  . Migraines 04/17/2012  . Chronic lower back pain 04/17/2012    "just got over some; catched when I walked"  . Depression   . Anxiety   . Sleep apnea     Past Surgical History  Procedure Laterality Date  . Cardiac catheterization  ~ 2011    History reviewed. No pertinent family history.  Social History:  reports that she has been smoking Cigarettes.  She has a .8 pack-year smoking history. She has never used smokeless tobacco. She reports that  drinks alcohol. She reports that she does not use illicit drugs.   Review of Systems: Constitutional:  denies fever, chills, diaphoresis, appetite change and fatigue.  HEENT: denies photophobia, eye pain, redness, hearing loss, ear pain, congestion, sore throat, rhinorrhea, sneezing, neck pain, neck stiffness and tinnitus.  Respiratory: denies SOB, DOE, cough, chest tightness, and wheezing.  Cardiovascular: admits to chest pain,  Denies  leg swelling.  Gastrointestinal: denies nausea, vomiting, abdominal pain, diarrhea, constipation, blood in stool.  Genitourinary: denies dysuria, urgency, frequency, hematuria, flank pain and difficulty urinating.  Musculoskeletal: denies  myalgias, back pain, joint swelling, arthralgias and gait problem.   Skin: denies pallor, rash and wound.  Neurological: denies dizziness, seizures, syncope, weakness, light-headedness, numbness and headaches.   Hematological: denies adenopathy, easy bruising, personal or family bleeding history.  Psychiatric/ Behavioral: denies suicidal ideation, mood changes, confusion, nervousness, sleep disturbance and agitation.    Physical Exam: BP 176/108  Pulse 75  Temp(Src) 98.1 F (36.7 C) (Oral)  Resp 20  Ht 5\' 1"  (1.549 m)  Wt 248 lb 12.8 oz (112.855 kg)  BMI 47.03 kg/m2  SpO2 99%  LMP 05/18/2013  General: Vital signs reviewed and noted. She is  morbidly obese  Head: Normocephalic, atraumatic, sclera anicteric, mucus membranes are moist  Neck: Supple. Negative for carotid bruits. JVD not elevated.  Lungs:  Clear bilaterally to auscultation without wheezes, rales, or rhonchi. Breathing is unlabored.  Heart: RRR with S1 S2. No murmurs, rubs, or gallops . Heart sounds are distant  Abdomen:  Soft, morbid obesity  MSK: Strength and the appear normal for age.  Extremities: No clubbing or cyanosis. No edema.  Distal pedal pulses are 2+ and equal bilaterally.  Neurologic: Alert and oriented X 3. Moves all extremities spontaneously.  Psych: Responds to questions appropriately with a normal affect.    Lab results: Basic Metabolic Panel:  Recent Labs Lab 05/22/13 2109 05/23/13 0430  NA 135 133*  K 3.2* 2.9*  CL 96 94*  CO2 25 27  GLUCOSE 253* 268*  BUN 14 13  CREATININE 1.03 1.09  CALCIUM 9.3 9.3    Liver Function Tests:  Recent Labs Lab 05/23/13 0430  AST 12  ALT 9  ALKPHOS 80  BILITOT 0.2*  PROT 7.4  ALBUMIN 3.1*   No results found for this basename: LIPASE, AMYLASE,  in the last 168 hours No results found for this basename: AMMONIA,  in the last 168 hours  CBC:  Recent Labs Lab 05/22/13 2109 05/23/13 0430  WBC 11.0* 10.9*  HGB 12.6 11.9*  HCT 36.9 37.5  MCV 72.1* 73.5*  PLT 322 294    Cardiac Enzymes:  Recent Labs Lab 05/23/13 0020 05/23/13 0430  TROPONINI <0.30 <0.30    BNP: No components found with this basename: POCBNP,   CBG:  Recent Labs Lab 05/23/13 0802  GLUCAP 222*    Coagulation Studies: No results found for this basename: LABPROT, INR,  in the last 72 hours   Other results:  EKG:   NSR, LVH with Repol abnormality   Imaging: Dg Chest 2 View  05/22/2013   CLINICAL DATA:  Headaches with chronic shortness of breath. Mid chest pain. History of hypertension and diabetes.  EXAM: CHEST  2 VIEW  COMPARISON:  04/17/2012.  FINDINGS: There is stable cardiomegaly and aortic  tortuosity. Linear scarring in the lingula is unchanged. The lungs are otherwise clear. There is no pleural effusion or pneumothorax. The osseous structures appear normal.  IMPRESSION: Stable cardiomegaly and lingular scarring. No acute cardiopulmonary process.   Electronically Signed   By: Roxy Horseman   On: 05/22/2013 20:35       Assessment & Plan: 1. Chest Pain; episodes of chest pain are somewhat atypical. She has sharp intermittent chest pains. She then has a chest tightness that can last for many hours at a time. She's had recurrent episodes of chest tightness. Despite this, her cardiac enzymes have always been negative.  She has several serious medical issues which she completely ignors. She has long-standing hypertension and in fact has evidence of LVH with repolarization on EKG. She has long-standing diabetes mellitus but fails to take her diabetes medicines on a regular basis. She also has hyperlipidemia and doesn't take any medications.  She continues to use illegal drugs - + UDS this admssion and during past admissions.  She does have risk factors for coronary artery disease but at this time I do not think there is any benefit in performing a stress test. Her cardiac enzymes are negative despite hours of chest discomfort. It would seem quite unlikely that she has serious coronary artery disease.  She is at risk of developing coronary artery disease but this will need risk factor modification and  is something that she will need to address.   No further recs. Will sign off.   Vesta Mixer, Montez Hageman., MD, [facility] 05/23/2013, 11:17 AM Office - [phone] Pager [phone]    Vesta Mixer, Montez Hageman., MD, [facility] 05/23/2013, 11:07 AM

## 2013-05-23 NOTE — Progress Notes (Signed)
Inpatient Diabetes Program Recommendations  AACE/ADA: New Consensus Statement on Inpatient Glycemic Control (2013)  Target Ranges:  Prepandial:   less than 140 mg/dL      Peak postprandial:   less than 180 mg/dL (1-2 hours)      Critically ill patients:  140 - 180 mg/dL     Results for Diamond Rice, Diamond Rice (MRN [id number]) as of 05/23/2013 14:30  Ref. Range 05/23/2013 00:15  Hemoglobin A1C Latest Range: <5.7 % 10.2 (H)    **Patient admitted with CP.  Has history of HTN and DM.  Current A1c shows extremely poor CBG control at home.    **Spoke with patient about her home DM care.  Patient told me she has not been consistently taking her DM medications for the last several months.  Patient told me she was hospitalized in [place] in June of this year and was given insulin pens (she thinks Lantus and Humalog) upon d/c.  Patient stated she does not like the way the needles feel and quit taking the insulin as prescribed by the MD in [place].    **Spoke to patient about her current A1c of 10.2% (05/23/13).  Explained what an A1c is and what it measures.  Reminded patient that his goal A1c is 7% or less per ADA standards to prevent both acute and long-term complications.  Encouraged patient to check her CBGs at least bid at home (fasting and another check within the day) and to record all CBGs in a logbook for his PCP to review.  **Patient told me that she is basically homeless and does not have a job.  I have contacted Kristi with care management to assess what we can do for patient before d/c.  Patient may be eligible for the [facility] fund, however, this program will only cover medications for one month and I am not sure patient will be able to afford medications after that.  When making d/c plans for this patient, please keep cost considerations in mind.  Also, would patient be able to follow up in the [facility] clinic?    Will follow. Ambrose Finland RN, MSN, CDE Diabetes  Coordinator Inpatient Diabetes Program Team Pager: [phone] (8a-10p)

## 2013-05-24 LAB — GLUCOSE, CAPILLARY
Glucose-Capillary: 209 mg/dL — ABNORMAL HIGH (ref 70–99)
Glucose-Capillary: 310 mg/dL — ABNORMAL HIGH (ref 70–99)

## 2013-05-24 LAB — BASIC METABOLIC PANEL
BUN: 22 mg/dL (ref 6–23)
CO2: 28 mEq/L (ref 19–32)
Calcium: 9.6 mg/dL (ref 8.4–10.5)
GFR calc Af Amer: 71 mL/min — ABNORMAL LOW (ref >90)
GFR calc non Af Amer: 61 mL/min — ABNORMAL LOW (ref >90)
Glucose, Bld: 287 mg/dL — ABNORMAL HIGH (ref 70–99)
Potassium: 3.3 mEq/L — ABNORMAL LOW (ref 3.5–5.1)
Sodium: 131 mEq/L — ABNORMAL LOW (ref 135–145)

## 2013-05-24 MED ORDER — INSULIN ASPART 100 UNIT/ML ~~LOC~~ SOLN
5.0000 [IU] | Freq: Once | SUBCUTANEOUS | Status: AC
  Administered 2013-05-24: 5 [IU] via SUBCUTANEOUS

## 2013-05-24 MED ORDER — NITROGLYCERIN 0.4 MG SL SUBL: 0.4000 mg | SUBLINGUAL_TABLET | SUBLINGUAL | Status: DC | PRN

## 2013-05-24 MED ORDER — METFORMIN HCL 1000 MG PO TABS: 1000.0000 mg | ORAL_TABLET | Freq: Two times a day (BID) | ORAL | Status: DC

## 2013-05-24 MED ORDER — LISINOPRIL 10 MG PO TABS: 10.0000 mg | ORAL_TABLET | Freq: Every day | ORAL | Status: DC

## 2013-05-24 MED ORDER — HYDROCHLOROTHIAZIDE 25 MG PO TABS: 25.0000 mg | ORAL_TABLET | Freq: Every day | ORAL | Status: DC

## 2013-05-24 MED ORDER — BUPROPION HCL 75 MG PO TABS
75.0000 mg | ORAL_TABLET | Freq: Two times a day (BID) | ORAL | Status: DC
  Administered 2013-05-24 – 2013-05-26 (×4): 75 mg via ORAL
  Filled 2013-05-24 (×5): qty 1

## 2013-05-24 MED ORDER — ASPIRIN 81 MG PO TBEC: 81.0000 mg | DELAYED_RELEASE_TABLET | Freq: Every day | ORAL | Status: DC

## 2013-05-24 MED ORDER — METOPROLOL TARTRATE 12.5 MG HALF TABLET: 12.5000 mg | ORAL_TABLET | Freq: Two times a day (BID) | ORAL | Status: DC

## 2013-05-24 MED ORDER — ATORVASTATIN CALCIUM 20 MG PO TABS: 20.0000 mg | ORAL_TABLET | Freq: Every day | ORAL | Status: DC

## 2013-05-24 MED ORDER — LISINOPRIL 20 MG PO TABS
20.0000 mg | ORAL_TABLET | Freq: Every day | ORAL | Status: DC
  Administered 2013-05-25 – 2013-05-26 (×2): 20 mg via ORAL
  Filled 2013-05-24 (×2): qty 1

## 2013-05-24 MED ORDER — AMLODIPINE BESYLATE 10 MG PO TABS: 10.0000 mg | ORAL_TABLET | Freq: Every day | ORAL | Status: DC

## 2013-05-24 NOTE — Progress Notes (Signed)
Patient ID: Diamond Rice, female   DOB: [birth year], 42 y.o.   MRN: [id number] Family Medicine Teaching Service Daily Progress Note Intern Pager: [phone]  [name] Medical record number: [id number] Date of birth: 10/12/1970 Age: 42 y.o. Gender: female  Primary Care Provider: Gaspar Bidding, DO Consultants: cardiology Code Status: Full  Pt Overview and Major Events to Date:   Assessment and Plan: Diamond Rice is a 42 y.o. female presenting with chest pain and high blood pressure. PMH is significant for HTN, DM, HLD, previous admissions for chest pain. Patient is homeless and travels between [place] and [place]. She has non-compliance with DM management because she does not like the needles and she can not afford the medications for DM and HTN. CM has given resources for shelters and [facility] program. Patient is already established at [facility] with Dr. Berline Chough.   Chest pain: ACS rule out was completed given history of HTN, HLD, DM, tobacco use and family history of premature cardiac death in mother (age 66). Troponin negative x3.EKG unchanged from old (inverted T waves in I, V5, V6), i-STAT troponins negative, bmet with potassium 3.2 and glucose 253, BNP 160.5. CXR shows stable cardiomegaly, no acute process. Has recent hospitalization in [facility] main with ICU stay. Had myoview in 03/2012 showed EF 56% and no reversible perfusion defects. - Risk start completed and resulted below - EKG unchanged, has T wave inversion I, II, V4-V6, LVH - Cardiology consult appreciated given her risk factors; no further work up necessary per cards.  Hypertension: has been off of BP meds for over a month due to financial constraints. CM has given patient information on [facility] program, however, pt is homeless and unemployed, likely will not be able to afford medications after 30 days.  - Hydralazine PRN for SBP >200 and DBP >120  - Increase amlodipine to 10mg , continue HCTZ 25mg , start lisinopril 10mg ,  start metoprolol 12.5mg  ( may increase metoprolol today) - BP improved, but not at goal this morning (146/93)   Hypokalemia: 3.3 last night - Replete with Kdur 40 mEq   Asthma: currently breathing fine, was wheezing in ED  and given albuterol x 1. No complaints of SOB and saturating 98-100% on RA.  - Albuterol mdi PRN   Right red eye: unclear etiology, currently not complaining of any changes in vision and acuity normal. Denies trauma or rubbing to area.  - Monitor for worsening acuity, swelling --> stable/improving.  - If worsens consider consult to optho   Social: currently homeless, cannot afford medications  - CSW for tobacco, homelessness and other financial resources  - Case management seen in ED, to give [facility] letter and additional resources to help with medications as outpatient   HLD: lipid panel per above  - Continue home atorvastatin 20mg    DM: A1c per above. Metformin, insulin (lantus) at home but does not use it.  - CBG 4x daily AC and HS >240 - 266 yesterday  - Moderate SSI ( total of 16 units yesterday)  - Add Lantus if needed   FEN/GI: kvo, Heart Healthy diet  Prophylaxis: heparin sq  Disposition: No further cardiac work-up warranted at this time. Patient may be medically ready for discharge this afternoon given continued improvement in BP. She has been given resources in the community for medications and shelter. She is a patient of [facility].   Subjective:  Patient sleeping this morning. Says she feels slightly better than last night. Headache has improved, but mildly present. Reports she  found out her sister-in-law passed away from cancer yesterday and that brought back her headache. Reviewed negative CT result and negative cardiac work up.   Objective: Temp:  [97.6 F (36.4 C)-98.5 F (36.9 C)] 98.1 F (36.7 C) (09/27 0424) Pulse Rate:  [71-84] 76 (09/27 0424) Resp:  [18-21] 20 (09/27 0424) BP: (137-186)/(93-113) 146/93 mmHg (09/27 0424) SpO2:  [97 %-99 %] 97  % (09/27 0424) Weight:  [251 lb (113.853 kg)] 251 lb (113.853 kg) (09/27 0424) Physical Exam: General: NAD, sleeping but easily awoken HEENT: PERRL, EOMI, visual acuity intact. Right lateral sclera stili injected, but stable Cardiovascular: RRR, normal heart sounds, 2/6 SM Respiratory: CTAB, effort normal Abdomen: soft, nontender, bowel sounds present Extremities: trace edema, 2+ DP pulses bilaterally  Laboratory:  Recent Labs Lab 05/22/13 2109 05/23/13 0430  WBC 11.0* 10.9*  HGB 12.6 11.9*  HCT 36.9 37.5  PLT 322 294    Recent Labs Lab 05/22/13 2109 05/23/13 0430 05/24/13 0540  NA 135 133* 131*  K 3.2* 2.9* 3.3*  CL 96 94* 92*  CO2 25 27 28   BUN 14 13 22   CREATININE 1.03 1.09 1.10  CALCIUM 9.3 9.3 9.6  PROT  --  7.4  --   BILITOT  --  0.2*  --   ALKPHOS  --  80  --   ALT  --  9  --   AST  --  12  --   GLUCOSE 253* 268* 287*   Lipid Panel     Component Value Date/Time   CHOL 184 05/23/2013 0430   TRIG 173* 05/23/2013 0430   HDL 42 05/23/2013 0430   CHOLHDL 4.4 05/23/2013 0430   VLDL 35 05/23/2013 0430   LDLCALC 107* 05/23/2013 0430  . CBG (last 3)   Recent Labs  05/23/13 1715 05/23/13 2131 05/24/13 0748  GLUCAP 259* 241* 266*   TSH: normal A1c: 10.2 Troponin neg x 3 Pro BNP 160.5  Imaging/Diagnostic Tests: 2v CXR: FINDINGS:  There is stable cardiomegaly and aortic tortuosity. Linear scarring  in the lingula is unchanged. The lungs are otherwise clear. There is  no pleural effusion or pneumothorax. The osseous structures appear  normal.  IMPRESSION:  Stable cardiomegaly and lingular scarring. No acute cardiopulmonary  process.   Natalia Leatherwood, DO 05/24/2013, 9:12 AM PGY-2, [facility] FPTS Intern pager: [phone], text pages welcome
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
## 2013-05-24 NOTE — Progress Notes (Addendum)
Attending Addendum  I examined the patient and discussed the assessment and plan with Dr. Claiborne Billings. I have reviewed the note and agree.  Patient has no complaints today. We discussed her HTN and DM2 management. She is established at the [facility]. Has failed to f/u due to concern for lack of co pay. We discussed that the front office staff will attempt to collect co pay every visit but if she does not have it all she can pay what she can or just tell them she does not have it. Finances should not be the reason she fails to f/u.     Plan replete K+ today and monitor BP and CBGs. D/c tomorrow with Rx through [facility] x one month supply. CM consult to provide shelter list. Patient to f/u at the [facility] for HTN f/u and to get assistance with applying for orange card and MAP program at [facility]. Patient agreed with plan and voiced understanding.     Dessa Phi, MD FAMILY MEDICINE TEACHING SERVICE

## 2013-05-25 DIAGNOSIS — F332 Major depressive disorder, recurrent severe without psychotic features: Secondary | ICD-10-CM

## 2013-05-25 DIAGNOSIS — R45851 Suicidal ideations: Secondary | ICD-10-CM

## 2013-05-25 DIAGNOSIS — F329 Major depressive disorder, single episode, unspecified: Secondary | ICD-10-CM

## 2013-05-25 LAB — GLUCOSE, CAPILLARY
Glucose-Capillary: 254 mg/dL — ABNORMAL HIGH (ref 70–99)
Glucose-Capillary: 230 mg/dL — ABNORMAL HIGH (ref 70–99)

## 2013-05-25 LAB — BASIC METABOLIC PANEL
Calcium: 9.3 mg/dL (ref 8.4–10.5)
Creatinine, Ser: 1.03 mg/dL (ref 0.50–1.10)
GFR calc Af Amer: 77 mL/min — ABNORMAL LOW (ref >90)
GFR calc non Af Amer: 67 mL/min — ABNORMAL LOW (ref >90)
Sodium: 134 mEq/L — ABNORMAL LOW (ref 135–145)

## 2013-05-25 MED ORDER — INSULIN ASPART 100 UNIT/ML ~~LOC~~ SOLN
0.0000 [IU] | Freq: Three times a day (TID) | SUBCUTANEOUS | Status: DC
  Administered 2013-05-26 (×3): 5 [IU] via SUBCUTANEOUS

## 2013-05-25 MED ORDER — INSULIN ASPART 100 UNIT/ML ~~LOC~~ SOLN
0.0000 [IU] | Freq: Every day | SUBCUTANEOUS | Status: DC
  Administered 2013-05-25: 3 [IU] via SUBCUTANEOUS

## 2013-05-25 NOTE — Progress Notes (Signed)
Patient ID: Diamond Rice, female   DOB: 03/21/1971, 42 y.o.   MRN: [id number] Family Medicine Teaching Service Daily Progress Note Intern Pager: [phone]  [name] Medical record number: [id number] Date of birth: [birth year] Age: 42 y.o. Gender: female  Primary Care Provider: Gaspar Bidding, DO Consultants: cardiology Code Status: Full  Pt Overview and Major Events to Date:   Assessment and Plan: Diamond Rice is a 42 y.o. female presenting with chest pain and high blood pressure. PMH is significant for HTN, DM, HLD, previous admissions for chest pain. Patient is homeless and travels between [place] and [place]. She has non-compliance with DM management because she does not like the needles and she can not afford the medications for DM and HTN. CM has given resources for shelters and [facility] program. Patient is already established at [facility] with Dr. Berline Chough.   # Chest pain: ACS rule out was completed given history of HTN, HLD, DM, tobacco use and family history of premature cardiac death in mother (age 48). Troponin negative x3.EKG unchanged from old (inverted T waves in I, V5, V6), i-STAT troponins negative, bmet with potassium 3.2 and glucose 253, BNP 160.5. CXR shows stable cardiomegaly, no acute process. Has recent hospitalization in [facility] main with ICU stay. Had myoview in 03/2012 showed EF 56% and no reversible perfusion defects. - Risk start completed and resulted below - EKG unchanged, has T wave inversion I, II, V4-V6, LVH - Cardiology consult appreciated given her risk factors; no further work up necessary per cards.  # Hypertension: has been off of BP meds for over a month due to financial constraints. CM has given patient information on [facility] program, however, pt is homeless and unemployed, likely will not be able to afford medications after 30 days.  - Hydralazine PRN for SBP >200 and DBP >120  - Increase amlodipine to 10mg , continue HCTZ 25mg , increased  lisinopril 20mg , start metoprolol 12.5mg  - BP improved, but not at goal: 108-165 / 91-104 over last 24hrs.  # Hypokalemia, resolved: 3.3 last night - 3.9 this morning  # Asthma: currently breathing fine, was wheezing in ED  and given albuterol x 1. No complaints of SOB and saturating 98-100% on RA.  - Albuterol mdi PRN   # Right red eye: unclear etiology, currently not complaining of any changes in vision and acuity normal. Denies trauma or rubbing to area.  - Monitor for worsening acuity, swelling --> stable/improving.  - If worsens consider consult to optho   # Social: currently homeless, cannot afford medications  - CSW for tobacco, homelessness and other financial resources  - Case management seen in ED, to give [facility] letter and additional resources to help with medications as outpatient   # HLD: lipid panel per above  - Continue home atorvastatin 20mg    # DM: A1c per above. Metformin, insulin (lantus) at home but does not use it.  - CBG 4x daily AC and HS >209-310 yesterday  - Moderate SSI (total of 18 units yesterday)  - Add Lantus if needed  FEN/GI: kvo, Heart Healthy diet  Prophylaxis: heparin sq  Disposition: No further cardiac work-up warranted at this time. Discharge this afternoon  Subjective:  Patient sleeping this morning. No headache this morning, it went away yesterday. No chest pain or shortness of breath. Feels ready to leave but mentions someone talked to her yesterday about resources for mental health and she has not gotten those yet.  Objective: Temp:  [98.2 F (36.8 C)-99 F (  37.2 C)] 98.2 F (36.8 C) (09/28 0500) Pulse Rate:  [80-95] 80 (09/28 0500) Resp:  [18] 18 (09/28 0500) BP: (108-165)/(91-104) 108/104 mmHg (09/28 0500) SpO2:  [98 %] 98 % (09/28 0500) Weight:  [251 lb (113.853 kg)] 251 lb (113.853 kg) (09/28 0500) Physical Exam: General: NAD, sleeping but easily awoken HEENT: PERRL, EOMI, visual acuity intact. Right lateral sclera less red this  morning. Cardiovascular: RRR, normal heart sounds, 2/6 SM Respiratory: CTAB, effort normal Abdomen: soft, nontender, bowel sounds present Extremities: trace edema, 2+ DP pulses bilaterally  Laboratory:  Recent Labs Lab 05/22/13 2109 05/23/13 0430  WBC 11.0* 10.9*  HGB 12.6 11.9*  HCT 36.9 37.5  PLT 322 294    Recent Labs Lab 05/22/13 2109 05/23/13 0430 05/24/13 0540  NA 135 133* 131*  K 3.2* 2.9* 3.3*  CL 96 94* 92*  CO2 25 27 28   BUN 14 13 22   CREATININE 1.03 1.09 1.10  CALCIUM 9.3 9.3 9.6  PROT  --  7.4  --   BILITOT  --  0.2*  --   ALKPHOS  --  80  --   ALT  --  9  --   AST  --  12  --   GLUCOSE 253* 268* 287*   Lipid Panel     Component Value Date/Time   CHOL 184 05/23/2013 0430   TRIG 173* 05/23/2013 0430   HDL 42 05/23/2013 0430   CHOLHDL 4.4 05/23/2013 0430   VLDL 35 05/23/2013 0430   LDLCALC 107* 05/23/2013 0430  . CBG (last 3)   Recent Labs  05/24/13 1153 05/24/13 1618 05/24/13 2028  GLUCAP 228* 209* 310*   TSH: normal A1c: 10.2 Troponin neg x 3 Pro BNP 160.5  Imaging/Diagnostic Tests: 2v CXR: FINDINGS:  There is stable cardiomegaly and aortic tortuosity. Linear scarring  in the lingula is unchanged. The lungs are otherwise clear. There is  no pleural effusion or pneumothorax. The osseous structures appear  normal.  IMPRESSION:  Stable cardiomegaly and lingular scarring. No acute cardiopulmonary  process.   Tawni Carnes, MD 05/25/2013, 7:28 AM PGY-1, [facility] Family Medicine FPTS Intern pager: [phone], text pages welcome

## 2013-05-25 NOTE — Progress Notes (Signed)
Utilization Review completed.  

## 2013-05-25 NOTE — Progress Notes (Signed)
Attending Addendum  I examined the patient and discussed the assessment and plan with Dr. Waynetta Sandy. I have reviewed the note and agree.  Patient's HA has resolved and her potassium is being repleted. She still has HTN and will require further medication adjustments on the outpatient setting. There are no signs of end organ damage with normal exam.   Her affect is mildly depressed she admits to suicidal thoughts w/o plan. Did not order psych consult yesterday as she had no plan for suicide and was not behaving abnormally. We did restart Wellbutrin which she has been on before. Will place referral to psych for eval and recommendations  given report of increased SI today and order sitter. Not stable for discharge today.   She will need resources regarding and shelter and mental health prior to discharge. I have talked to her at length about clinic follow up and that our clinic will be a resource for her as well. She is alert and oriented with normal thought content. She is aware that if pervasive suicidal thoughts develop she can call the help line or seek care at [facility] ED.       Dessa Phi, MD FAMILY MEDICINE TEACHING SERVICE
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
## 2013-05-26 ENCOUNTER — Inpatient Hospital Stay (HOSPITAL_COMMUNITY)
Admission: AD | Admit: 2013-05-26 | Discharge: 2013-06-03 | DRG: 885 | Disposition: A | Discharge status: Home / Self Care | Payer: Self-pay | Source: Intra-hospital | Attending: Psychiatry | Admitting: Psychiatry

## 2013-05-26 ENCOUNTER — Encounter (HOSPITAL_COMMUNITY): Payer: Self-pay

## 2013-05-26 DIAGNOSIS — F411 Generalized anxiety disorder: Secondary | ICD-10-CM | POA: Diagnosis present

## 2013-05-26 DIAGNOSIS — E119 Type 2 diabetes mellitus without complications: Secondary | ICD-10-CM | POA: Diagnosis present

## 2013-05-26 DIAGNOSIS — Z5900 Homelessness unspecified: Secondary | ICD-10-CM

## 2013-05-26 DIAGNOSIS — F332 Major depressive disorder, recurrent severe without psychotic features: Principal | ICD-10-CM | POA: Diagnosis present

## 2013-05-26 DIAGNOSIS — F1994 Other psychoactive substance use, unspecified with psychoactive substance-induced mood disorder: Secondary | ICD-10-CM | POA: Diagnosis present

## 2013-05-26 DIAGNOSIS — Z79899 Other long term (current) drug therapy: Secondary | ICD-10-CM

## 2013-05-26 DIAGNOSIS — F1410 Cocaine abuse, uncomplicated: Secondary | ICD-10-CM | POA: Diagnosis present

## 2013-05-26 DIAGNOSIS — G4730 Sleep apnea, unspecified: Secondary | ICD-10-CM | POA: Diagnosis present

## 2013-05-26 DIAGNOSIS — M545 Low back pain, unspecified: Secondary | ICD-10-CM | POA: Diagnosis present

## 2013-05-26 DIAGNOSIS — F1414 Cocaine abuse with cocaine-induced mood disorder: Secondary | ICD-10-CM | POA: Diagnosis present

## 2013-05-26 DIAGNOSIS — E1165 Type 2 diabetes mellitus with hyperglycemia: Secondary | ICD-10-CM

## 2013-05-26 DIAGNOSIS — Z720 Tobacco use: Secondary | ICD-10-CM

## 2013-05-26 DIAGNOSIS — R079 Chest pain, unspecified: Secondary | ICD-10-CM

## 2013-05-26 DIAGNOSIS — G8929 Other chronic pain: Secondary | ICD-10-CM | POA: Diagnosis present

## 2013-05-26 DIAGNOSIS — E785 Hyperlipidemia, unspecified: Secondary | ICD-10-CM

## 2013-05-26 DIAGNOSIS — F12280 Cannabis dependence with cannabis-induced anxiety disorder: Secondary | ICD-10-CM | POA: Diagnosis present

## 2013-05-26 DIAGNOSIS — F1220 Cannabis dependence, uncomplicated: Secondary | ICD-10-CM | POA: Diagnosis present

## 2013-05-26 DIAGNOSIS — F333 Major depressive disorder, recurrent, severe with psychotic symptoms: Secondary | ICD-10-CM

## 2013-05-26 DIAGNOSIS — I10 Essential (primary) hypertension: Secondary | ICD-10-CM | POA: Diagnosis present

## 2013-05-26 LAB — BASIC METABOLIC PANEL
BUN: 20 mg/dL (ref 6–23)
CO2: 26 mEq/L (ref 19–32)
Chloride: 94 mEq/L — ABNORMAL LOW (ref 96–112)
Creatinine, Ser: 1.13 mg/dL — ABNORMAL HIGH (ref 0.50–1.10)
GFR calc Af Amer: 69 mL/min — ABNORMAL LOW (ref >90)
Glucose, Bld: 248 mg/dL — ABNORMAL HIGH (ref 70–99)
Potassium: 4 mEq/L (ref 3.5–5.1)

## 2013-05-26 LAB — GLUCOSE, CAPILLARY
Glucose-Capillary: 244 mg/dL — ABNORMAL HIGH (ref 70–99)
Glucose-Capillary: 298 mg/dL — ABNORMAL HIGH (ref 70–99)
Glucose-Capillary: 250 mg/dL — ABNORMAL HIGH (ref 70–99)
Glucose-Capillary: 254 mg/dL — ABNORMAL HIGH (ref 70–99)

## 2013-05-26 MED ORDER — ALUM & MAG HYDROXIDE-SIMETH 200-200-20 MG/5ML PO SUSP
30.0000 mL | ORAL | Status: DC | PRN
  Administered 2013-05-30: 30 mL via ORAL

## 2013-05-26 MED ORDER — BUPROPION HCL 75 MG PO TABS: 75.0000 mg | ORAL_TABLET | Freq: Two times a day (BID) | ORAL | Status: DC

## 2013-05-26 MED ORDER — INSULIN GLARGINE 100 UNIT/ML ~~LOC~~ SOLN
20.0000 [IU] | Freq: Every day | SUBCUTANEOUS | Status: DC
  Administered 2013-05-26: 20 [IU] via SUBCUTANEOUS
  Filled 2013-05-26: qty 0.2

## 2013-05-26 MED ORDER — INSULIN ASPART 100 UNIT/ML ~~LOC~~ SOLN
0.0000 [IU] | Freq: Three times a day (TID) | SUBCUTANEOUS | Status: DC
  Administered 2013-05-27: 15 [IU] via SUBCUTANEOUS
  Administered 2013-05-27 (×2): 5 [IU] via SUBCUTANEOUS
  Administered 2013-05-28: 3 [IU] via SUBCUTANEOUS
  Administered 2013-05-28: 11 [IU] via SUBCUTANEOUS
  Administered 2013-05-28: 3 [IU] via SUBCUTANEOUS
  Administered 2013-05-29: 8 [IU] via SUBCUTANEOUS
  Administered 2013-05-29: 5 [IU] via SUBCUTANEOUS
  Administered 2013-05-29 – 2013-05-30 (×2): 8 [IU] via SUBCUTANEOUS
  Administered 2013-05-30: 3 [IU] via SUBCUTANEOUS
  Administered 2013-05-30: 8 [IU] via SUBCUTANEOUS
  Administered 2013-05-31: 5 [IU] via SUBCUTANEOUS
  Administered 2013-05-31: 3 [IU] via SUBCUTANEOUS
  Administered 2013-05-31: 11 [IU] via SUBCUTANEOUS
  Administered 2013-06-01: 3 [IU] via SUBCUTANEOUS
  Administered 2013-06-01: 8 [IU] via SUBCUTANEOUS
  Administered 2013-06-01: 5 [IU] via SUBCUTANEOUS
  Administered 2013-06-02: 11 [IU] via SUBCUTANEOUS
  Administered 2013-06-02: 5 [IU] via SUBCUTANEOUS
  Administered 2013-06-02 – 2013-06-03 (×2): 8 [IU] via SUBCUTANEOUS
  Administered 2013-06-03: 3 [IU] via SUBCUTANEOUS

## 2013-05-26 MED ORDER — MAGNESIUM HYDROXIDE 400 MG/5ML PO SUSP: 30.0000 mL | Freq: Every day | ORAL | Status: DC | PRN

## 2013-05-26 MED ORDER — INSULIN ASPART 100 UNIT/ML ~~LOC~~ SOLN
0.0000 [IU] | Freq: Every day | SUBCUTANEOUS | Status: DC
  Administered 2013-05-26: 3 [IU] via SUBCUTANEOUS
  Administered 2013-05-27: 2 [IU] via SUBCUTANEOUS
  Administered 2013-05-28 – 2013-05-29 (×2): 3 [IU] via SUBCUTANEOUS
  Administered 2013-05-30: 2 [IU] via SUBCUTANEOUS
  Administered 2013-05-31: 3 [IU] via SUBCUTANEOUS
  Administered 2013-06-01 – 2013-06-02 (×2): 4 [IU] via SUBCUTANEOUS

## 2013-05-26 MED ORDER — HYDROCHLOROTHIAZIDE 25 MG PO TABS
25.0000 mg | ORAL_TABLET | Freq: Every day | ORAL | Status: DC
  Administered 2013-05-27 – 2013-06-03 (×8): 25 mg via ORAL
  Filled 2013-05-26 (×12): qty 1

## 2013-05-26 MED ORDER — BUPROPION HCL ER (XL) 150 MG PO TB24
150.0000 mg | ORAL_TABLET | Freq: Every day | ORAL | Status: DC
  Administered 2013-05-27 – 2013-05-29 (×3): 150 mg via ORAL
  Filled 2013-05-26 (×4): qty 1

## 2013-05-26 MED ORDER — ACETAMINOPHEN 325 MG PO TABS
650.0000 mg | ORAL_TABLET | Freq: Four times a day (QID) | ORAL | Status: DC | PRN
Start: 2013-05-26 — End: 2013-06-03
  Administered 2013-05-27: 650 mg via ORAL
  Filled 2013-05-26: qty 2

## 2013-05-26 MED ORDER — LISINOPRIL 20 MG PO TABS: 20.0000 mg | ORAL_TABLET | Freq: Every day | ORAL | Status: DC

## 2013-05-26 MED ORDER — ATORVASTATIN CALCIUM 20 MG PO TABS
20.0000 mg | ORAL_TABLET | Freq: Every day | ORAL | Status: DC
  Administered 2013-05-27 – 2013-06-02 (×7): 20 mg via ORAL
  Filled 2013-05-26 (×4): qty 1
  Filled 2013-05-26 (×2): qty 3
  Filled 2013-05-26 (×4): qty 1

## 2013-05-26 MED ORDER — INSULIN GLARGINE 100 UNIT/ML ~~LOC~~ SOLN
20.0000 [IU] | Freq: Every day | SUBCUTANEOUS | Status: DC
  Filled 2013-05-26: qty 0.2

## 2013-05-26 MED ORDER — LISINOPRIL 20 MG PO TABS
20.0000 mg | ORAL_TABLET | Freq: Every day | ORAL | Status: DC
  Administered 2013-05-27 – 2013-06-03 (×8): 20 mg via ORAL
  Filled 2013-05-26 (×3): qty 1
  Filled 2013-05-26: qty 3
  Filled 2013-05-26 (×2): qty 1
  Filled 2013-05-26: qty 3
  Filled 2013-05-26 (×4): qty 1

## 2013-05-26 MED ORDER — ASPIRIN EC 81 MG PO TBEC
81.0000 mg | DELAYED_RELEASE_TABLET | Freq: Every day | ORAL | Status: DC
  Administered 2013-05-27 – 2013-06-03 (×8): 81 mg via ORAL
  Filled 2013-05-26 (×10): qty 1

## 2013-05-26 MED ORDER — AMLODIPINE BESYLATE 10 MG PO TABS
10.0000 mg | ORAL_TABLET | Freq: Every day | ORAL | Status: DC
  Administered 2013-05-27 – 2013-06-03 (×8): 10 mg via ORAL
  Filled 2013-05-26: qty 3
  Filled 2013-05-26 (×7): qty 1
  Filled 2013-05-26: qty 3
  Filled 2013-05-26 (×2): qty 1

## 2013-05-26 MED ORDER — HYDRALAZINE HCL 10 MG PO TABS
5.0000 mg | ORAL_TABLET | Freq: Four times a day (QID) | ORAL | Status: DC | PRN
  Administered 2013-05-27 (×2): 5 mg via ORAL
  Filled 2013-05-26: qty 1

## 2013-05-26 MED ORDER — TRAZODONE HCL 50 MG PO TABS
50.0000 mg | ORAL_TABLET | Freq: Every evening | ORAL | Status: DC | PRN
  Administered 2013-05-26 – 2013-06-02 (×10): 50 mg via ORAL
  Filled 2013-05-26 (×5): qty 1
  Filled 2013-05-26: qty 28
  Filled 2013-05-26 (×5): qty 1

## 2013-05-26 MED ORDER — METOPROLOL TARTRATE 12.5 MG HALF TABLET
12.5000 mg | ORAL_TABLET | Freq: Two times a day (BID) | ORAL | Status: DC
  Administered 2013-05-27 – 2013-06-01 (×12): 12.5 mg via ORAL
  Administered 2013-06-02: 09:00:00 via ORAL
  Administered 2013-06-02 – 2013-06-03 (×2): 12.5 mg via ORAL
  Filled 2013-05-26 (×2): qty 1
  Filled 2013-05-26: qty 6
  Filled 2013-05-26 (×11): qty 1
  Filled 2013-05-26 (×2): qty 6
  Filled 2013-05-26 (×2): qty 1
  Filled 2013-05-26: qty 6
  Filled 2013-05-26 (×2): qty 1

## 2013-05-26 MED ORDER — INSULIN GLARGINE 100 UNIT/ML ~~LOC~~ SOLN
20.0000 [IU] | Freq: Every day | SUBCUTANEOUS | Status: DC
Start: 2013-05-26 — End: 2013-05-27

## 2013-05-26 NOTE — Clinical Social Work Psych Note (Addendum)
Psych CSW received notification from [facility] that pt has been accepted pending a 500 hall bed.  Once bed becomes available [facility] will contact Psych CSW.    Please note if it is after 3:30pm, [facility] will need to arrange this transition directly with the unit.  The unit can be reached at [phone].  RN will need to arrange for security to transport patient to [facility].  Sitter to accompany pt to [facility].  Sitter at bedside aware.  Signed Voluntary form (placed in chart) to accompany pt at [facility].    Voluntary form was faxed to [facility]  Vickii Penna, LCSWA [phone]  Clinical Social Work

## 2013-05-26 NOTE — Discharge Summary (Signed)
Family Medicine Teaching [facility] Discharge Summary  [name] Medical record number: [id number] Date of birth: 1970-11-13 Age: 42 y.o. Gender: female Date of Admission: 05/22/2013  Date of Discharge: 05/26/2013 Admitting Physician: Barbaraann Barthel, MD  Primary Care Provider: Gaspar Bidding, DO Consultants: Psychology, Cardiology  Indication for Hospitalization: Chest pain, HTN  Discharge Diagnoses/Problem List:  Patient Active Problem List   Diagnosis Date Noted  . Morbid obesity with BMI of 45.0-49.9, adult 04/25/2012  . Chest pain 04/17/2012  . Hypertension 04/17/2012  . DM (diabetes mellitus), type 2, uncontrolled 04/17/2012  . Hyperlipidemia 04/17/2012  . Tobacco use 04/17/2012    Disposition: Medically stable. Discharged to inpatient psych for Sucidial ideations.   Discharge Condition: Medically stable.    Brief Hospital Course: Diamond Rice is a 42 y.o. female presented to ED with chest pain and high blood pressure. PMH is significant for HTN, DM, HLD, previous admissions for chest pain with negative work up. EKG without significant changes, troponin negative x3. Cardiology consulted and felt no additional work-up was warranted considering recent negative workup at [facility]. Patient was medically stable and ready for discharge when she expressed suicidal ideations to the team. Psychiatry consulted and felt she was a candidate for inpatient psychiatry.   Patient is homeless and travels between [place] and [place]. She has non-compliance with DM management because she does not like the needles and she can not afford the medications for DM and HTN. CM has given resources for shelters and [facility] program. Patient is already established at [facility] with Dr. Berline Chough.   # Psych: Sucidial ideations expressed, patient reported feeling helpless. Recent stressor of sister in law passing away on Friday, as well as. Psych consulted and recommneded  inpatient  hospitalization. Patient reportedly hearing voices, Haldol was recommneded if necessary, but never needed.   # Chest pain: ACS rule out was completed given history of HTN, HLD, DM, tobacco use and family history of premature cardiac death in mother (age 55). Troponin negative x3.EKG unchanged from old (inverted T waves in I, V5, V6), i-STAT troponins negative, bmet with potassium 3.2 and glucose 253, BNP 160.5. CXR shows stable cardiomegaly, no acute process. Has recent hospitalization in [facility] main with ICU stay. Had myoview in 03/2012 showed EF 56% and no reversible perfusion defects. Cardiology consulted and they did not feel further work up necessary.   # Hypertension: has been off of BP meds for over a month due to financial constraints. CM has given patient information on [facility] program, however, pt is homeless and unemployed, likely will not be able to afford medications after 30 days. Patient restarted on prior medications, with alteratins made where appropriate, list of current regimen below. Will need to have follow with patient after [facility] discharge to arrange outpatient medication  assistance with MAP program. BP improved with restart of medications, 137/91 on day of discharge.   # Asthma: Patient was wheezing in ED and given albuterol x 1. No complaints of SOB and saturating 98-100% on RA therafter.   # Social: currently homeless, cannot afford medications. CSW for tobacco, homelessness and other financial resources. Case management seen in ED, to give [facility] letter and additional resources to help with medications as outpatient. PAteint will need close follow up after [facility] discharge to introduce into further assistance programs for medications, such as MAP.   # HLD: lipid panel per below Continue home atorvastatin 20mg .  # DM: A1c 10.2. Metformin, insulin (lantus) at home but does not use  it. CBG monitored 4x daily AC and HS >207-299 on day of discharge. Patient was placed of SSI during  hospitalization and Lantus 20 units daily.   Issues for Follow Up:  - Homelessness - Pt will need assistance with mediations, consider MAP program - Diabetes education; Pt has voiced not wanting to use needles for diabetes.   Significant Procedures: None   Significant Labs and Imaging:   Recent Labs Lab 05/22/13 2109 05/23/13 0430  WBC 11.0* 10.9*  HGB 12.6 11.9*  HCT 36.9 37.5  PLT 322 294    Recent Labs Lab 05/22/13 2109 05/23/13 0430 05/24/13 0540 05/25/13 0710 05/26/13 0405  NA 135 133* 131* 134* 133*  K 3.2* 2.9* 3.3* 3.9 4.0  CL 96 94* 92* 97 94*  CO2 25 27 28 29 26   GLUCOSE 253* 268* 287* 261* 248*  BUN 14 13 22 18 20   CREATININE 1.03 1.09 1.10 1.03 1.13*  CALCIUM 9.3 9.3 9.6 9.3 9.6  ALKPHOS  --  80  --   --   --   AST  --  12  --   --   --   ALT  --  9  --   --   --   ALBUMIN  --  3.1*  --   --   --        Recent Labs Lab 05/23/13 0020 05/23/13 0430 05/23/13 1645  TROPONINI <0.30 <0.30 <0.30   Lipid Panel     Component Value Date/Time   CHOL 184 05/23/2013 0430   TRIG 173* 05/23/2013 0430   HDL 42 05/23/2013 0430   CHOLHDL 4.4 05/23/2013 0430   VLDL 35 05/23/2013 0430   LDLCALC 107* 05/23/2013 0430      Results/Tests Pending at Time of Discharge: None  Discharge Medications:    Medication List         amLODipine 10 MG tablet  Commonly known as:  NORVASC  Take 1 tablet (10 mg total) by mouth daily.     aspirin 81 MG EC tablet  Take 1 tablet (81 mg total) by mouth daily.     atorvastatin 20 MG tablet  Commonly known as:  LIPITOR  Take 1 tablet (20 mg total) by mouth daily at 6 PM.     buPROPion 75 MG tablet  Commonly known as:  WELLBUTRIN  Take 1 tablet (75 mg total) by mouth 2 (two) times daily.     hydrochlorothiazide 25 MG tablet  Commonly known as:  HYDRODIURIL  Take 1 tablet (25 mg total) by mouth daily.     insulin glargine 100 UNIT/ML injection  Commonly known as:  LANTUS  Inject 0.2 mLs (20 Units total) into the  skin daily.     lisinopril 20 MG tablet  Commonly known as:  PRINIVIL,ZESTRIL  Take 1 tablet (20 mg total) by mouth daily.     metFORMIN 1000 MG tablet  Commonly known as:  GLUCOPHAGE  Take 1 tablet (1,000 mg total) by mouth 2 (two) times daily with a meal.     metoprolol tartrate 12.5 mg Tabs tablet  Commonly known as:  LOPRESSOR  Take 0.5 tablets (12.5 mg total) by mouth 2 (two) times daily.     nitroGLYCERIN 0.4 MG SL tablet  Commonly known as:  NITROSTAT  Place 1 tablet (0.4 mg total) under the tongue every 5 (five) minutes as needed for chest pain.     RELION LANCETS STANDARD 21G Misc  1 each by Does not apply route 2 (  two) times daily.     RELION PRIME MONITOR Devi  1 each by Does not apply route 2 (two) times daily.        Discharge Instructions: Please refer to Patient Instructions section of EMR for full details.  Patient was counseled important signs and symptoms that should prompt return to medical care, changes in medications, dietary instructions, activity restrictions, and follow up appointments.   Follow-Up Appointments: Follow-up Information   Follow up with RIGBY, MICHAEL, DO. Schedule an appointment as soon as possible for a visit in 1 week. (Please call to make follow up appt so we can attmept to get you into assistance program for medications long term)    Specialty:  Family Medicine   Contact information:   [address] [place] [address] [phone]       Natalia Leatherwood, DO 05/26/2013, 4:08 PM PGY-2, [facility]

## 2013-05-26 NOTE — Clinical Social Work Psych Note (Signed)
Psych CSW spoke with Unit CSW re: pt disposition.  Pt has been referred to [facility] for review for inpatient psychiatric treatment.  Psych CSW awaiting determination.  Psych CSW will update medical staff once contacted by [facility].  Vickii Penna, LCSWA [phone]  Clinical Social Work

## 2013-05-26 NOTE — Discharge Summary (Signed)
Attending Addendum  I examined the patient and discussed the discharge plan with Dr. Kuneff. I have reviewed the note and agree.    Aristea Posada, MD FAMILY MEDICINE TEACHING SERVICE  
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
## 2013-05-26 NOTE — Progress Notes (Signed)
FMTS Attending Admission Note: Diamond Klare,MD I  have seen and examined this patient, reviewed their chart. I have discussed this patient with the resident. I agree with the resident's findings, assessment and care plan.  

## 2013-05-26 NOTE — Progress Notes (Addendum)
CSW (Clinical Child psychotherapist) spoke to psych CSW and confirmed pt is on her referral list and that pt is ready for dc today.  Willena Jeancharles, LCSWA [phone]

## 2013-05-26 NOTE — Progress Notes (Signed)
Utilization review completed.  

## 2013-05-26 NOTE — Progress Notes (Signed)
Inpatient Diabetes Program Recommendations  AACE/ADA: New Consensus Statement on Inpatient Glycemic Control (2013)  Target Ranges:  Prepandial:   less than 140 mg/dL      Peak postprandial:   less than 180 mg/dL (1-2 hours)      Critically ill patients:  140 - 180 mg/dL   Reason for Visit: Results for KAMIRA, MELLETTE (MRN [id number]) as of 05/26/2013 11:09  Ref. Range 05/25/2013 07:42 05/25/2013 11:10 05/25/2013 16:30 05/25/2013 20:48 05/26/2013 07:48  Glucose-Capillary Latest Range: 70-99 mg/dL 409 (H) 811 (H) 914 (H) 256 (H) 207 (H)   Consider adding Lantus 20 units daily to help control CBG's while in the hospital. Beryl Meager, RN, BC-ADM Inpatient Diabetes Coordinator Pager [phone]

## 2013-05-26 NOTE — Progress Notes (Signed)
Patient ID: Diamond Rice, female   DOB: 1970/10/02, 42 y.o.   MRN: [id number] Family Medicine Teaching Service Daily Progress Note Intern Pager: [phone]  [name] Medical record number: [id number] Date of birth: Jul 16, 1971 Age: 42 y.o. Gender: female  Primary Care Provider: Gaspar Bidding, DO Consultants: cardiology, psychiatry Code Status: Full  Pt Overview and Major Events to Date:   Assessment and Plan: Diamond Rice is a 42 y.o. female presenting with chest pain and high blood pressure. PMH is significant for HTN, DM, HLD, previous admissions for chest pain. Patient is homeless and travels between [place] and [place]. She has non-compliance with DM management because she does not like the needles and she can not afford the medications for DM and HTN. CM has given resources for shelters and [facility] program. Patient is already established at [facility] with Dr. Berline Rice.   # Psych: SI yesterday, patient reported feeling helpless. Recent stressor of sister in law passing away on Friday. - Psych consult appreciated: recommending inpatient hospitalization and possibly starting haldol qhs for reportedly hearing voices. - Psych SW consult for placement  # Chest pain: ACS rule out was completed given history of HTN, HLD, DM, tobacco use and family history of premature cardiac death in mother (age 64). Troponin negative x3.EKG unchanged from old (inverted T waves in I, V5, V6), i-STAT troponins negative, bmet with potassium 3.2 and glucose 253, BNP 160.5. CXR shows stable cardiomegaly, no acute process. Has recent hospitalization in [facility] main with ICU stay. Had myoview in 03/2012 showed EF 56% and no reversible perfusion defects. - Risk start completed and resulted below - EKG unchanged, has T wave inversion I, II, V4-V6, LVH - Cardiology consult appreciated given her risk factors: no further work up necessary per cards.  # Hypertension: has been off of BP meds for over a month due  to financial constraints. CM has given patient information on [facility] program, however, pt is homeless and unemployed, likely will not be able to afford medications after 30 days.  - Hydralazine PRN for SBP >200 and DBP >120 (required none) - Amlodipine to 10mg , HCTZ 25mg , lisinopril 20mg , metoprolol 12.5mg  - BP improved, at goal this morning: 137/91  # Asthma: currently breathing fine, was wheezing in ED  and given albuterol x 1. No complaints of SOB and saturating 98-100% on RA.  - Albuterol mdi PRN   # Right red eye: unclear etiology, currently not complaining of any changes in vision and acuity normal. Denies trauma or rubbing to area.  - Monitor for worsening acuity, swelling --> stable/improving.  - If worsens consider consult to optho   # Social: currently homeless, cannot afford medications  - CSW for tobacco, homelessness and other financial resources  - Case management seen in ED, to give [facility] letter and additional resources to help with medications as outpatient   # HLD: lipid panel per above  - Continue home atorvastatin 20mg    # DM: A1c per above. Metformin, insulin (lantus) at home but does not use it.  - CBG 4x daily AC and HS >207-299 yesterday  - Moderate SSI (total of 18 units yesterday)  - Add Lantus 20U daily  FEN/GI: kvo,carb modified diet  Prophylaxis: heparin sq  Disposition: No further cardiac work-up warranted at this time. Discharge pending psych bed availability  Subjective:  Patient "as good as I will be". No complaints of pain. Endorses SI today without specific plan (says she always has SI). No HI. Does say she  hears voices that have been worse lately, but doesn't tell what the voices say. She has concerns today about her BP and how many medications she is taking, and also that her sugars have been high while in the hospital.   Objective: Temp:  [98 F (36.7 C)-98.5 F (36.9 C)] 98.5 F (36.9 C) (09/29 0500) Pulse Rate:  [78-87] 79 (09/29 0500) Resp:   [20-21] 21 (09/29 0500) BP: (137-162)/(91-102) 137/91 mmHg (09/29 0500) SpO2:  [97 %-100 %] 97 % (09/29 0500) Weight:  [252 lb 1.6 oz (114.352 kg)] 252 lb 1.6 oz (114.352 kg) (09/29 0500) Physical Exam: General: NAD, sleeping but easily awoken HEENT: PERRL, EOMI, visual acuity intact. Right lateral sclera redness improving. Cardiovascular: RRR, normal heart sounds, 2/6 SM Respiratory: CTAB, effort normal Abdomen: soft, nontender, bowel sounds present Extremities: trace edema, 2+ DP pulses bilaterally  Laboratory:  Recent Labs Lab 05/22/13 2109 05/23/13 0430  WBC 11.0* 10.9*  HGB 12.6 11.9*  HCT 36.9 37.5  PLT 322 294    Recent Labs Lab 05/23/13 0430 05/24/13 0540 05/25/13 0710 05/26/13 0405  NA 133* 131* 134* 133*  K 2.9* 3.3* 3.9 4.0  CL 94* 92* 97 94*  CO2 27 28 29 26   BUN 13 22 18 20   CREATININE 1.09 1.10 1.03 1.13*  CALCIUM 9.3 9.6 9.3 9.6  PROT 7.4  --   --   --   BILITOT 0.2*  --   --   --   ALKPHOS 80  --   --   --   ALT 9  --   --   --   AST 12  --   --   --   GLUCOSE 268* 287* 261* 248*   Lipid Panel     Component Value Date/Time   CHOL 184 05/23/2013 0430   TRIG 173* 05/23/2013 0430   HDL 42 05/23/2013 0430   CHOLHDL 4.4 05/23/2013 0430   VLDL 35 05/23/2013 0430   LDLCALC 107* 05/23/2013 0430  . CBG (last 3)   Recent Labs  05/25/13 1630 05/25/13 2048 05/26/13 0748  GLUCAP 299* 256* 207*   TSH: normal A1c: 10.2 Troponin neg x 3 Pro BNP 160.5  Imaging/Diagnostic Tests: 2v CXR: FINDINGS:  There is stable cardiomegaly and aortic tortuosity. Linear scarring  in the lingula is unchanged. The lungs are otherwise clear. There is  no pleural effusion or pneumothorax. The osseous structures appear  normal.  IMPRESSION:  Stable cardiomegaly and lingular scarring. No acute cardiopulmonary  process.   Diamond Carnes, MD 05/26/2013, 7:28 AM PGY-1, [facility] Family Medicine FPTS Intern pager: [phone], text pages welcome

## 2013-05-27 DIAGNOSIS — F333 Major depressive disorder, recurrent, severe with psychotic symptoms: Secondary | ICD-10-CM

## 2013-05-27 LAB — GLUCOSE, CAPILLARY
Glucose-Capillary: 374 mg/dL — ABNORMAL HIGH (ref 70–99)
Glucose-Capillary: 243 mg/dL — ABNORMAL HIGH (ref 70–99)

## 2013-05-27 MED ORDER — IBUPROFEN 400 MG PO TABS
400.0000 mg | ORAL_TABLET | Freq: Once | ORAL | Status: AC
  Administered 2013-05-27: 400 mg via ORAL
  Filled 2013-05-27 (×2): qty 1

## 2013-05-27 NOTE — Progress Notes (Signed)
Adult Psychoeducational Group Note  Date: 05/27/2013  Time: 9:56 AM  Group Topic/Focus:  Orientation: The focus of this group is to educate the patient on the purpose and policies of crisis stabilization and provide a format to answer questions about their admission. The group details unit policies and expectations of patients while admitted.  Participation Level: Did Not Attend  Participation Quality:  Affect:  Cognitive:  Insight:  Engagement in Group:  Modes of Intervention:  Additional Comments: Pt did not attend.  Diontre Harps, Genia Plants

## 2013-05-27 NOTE — Progress Notes (Signed)
Nutrition Brief Note  Patient identified on the Malnutrition Screening Tool (MST) Report.  Wt Readings from Last 10 Encounters:  05/26/13 240 lb (108.863 kg)  05/26/13 252 lb 1.6 oz (114.352 kg)  11/18/12 240 lb (108.863 kg)  05/17/12 244 lb 11.2 oz (110.995 kg)  04/30/12 248 lb (112.492 kg)  04/25/12 247 lb 14.4 oz (112.447 kg)  04/20/12 252 lb 3.2 oz (114.397 kg)   Body mass index is 45.37 kg/(m^2). Patient meets criteria for class III extreme obesity based on current BMI.   Discussed intake PTA with patient and compared to intake presently. Pt reports her blood sugars were high PTA. States she was in the ICU in June 2014 and was started on Metformin but then got switched to insulin. Reports she was homeless PTA. Provided brief diabetic diet education which pt expressed understanding of.   Current diet order is diabetic and pt is also offered choice of unit snacks mid-morning and mid-afternoon.  Pt is eating as desired.   Labs and medications reviewed. CBGs elevated.   Nutrition Dx:  Altered nutrient related lab values r/t suboptimal glucose control AEB pt report  Interventions:   Discussed the importance of nutrition and encouraged intake of food and beverages on a regular meal schedule and to follow diabetic diet as closely as possible with community food resources    No additional nutrition interventions warranted at this time. If nutrition issues arise, please consult RD.   Levon Hedger MS, RD, LDN [phone] Pager [phone] After Hours Pager

## 2013-05-27 NOTE — Progress Notes (Signed)
Adult Psychoeducational Group Note  Date:  05/27/2013 Time:  11:00am  Group Topic/Focus:  Recovery Goals:   The focus of this group is to identify appropriate goals for recovery and establish a plan to achieve them.  Participation Level:  Did Not Attend  Participation Quality:    Affect:    Cognitive:    Insight:  Engagement in Group:    Modes of Intervention:    Additional Comments:  Pt did not attend group  Shelly Bombard D 05/27/2013, 1:38 PM

## 2013-05-27 NOTE — [facility] Suicide Risk Assessment (Signed)
Suicide Risk Assessment  Admission Assessment     Nursing information obtained from:    Demographic factors:    Current Mental Status:    Loss Factors:    Historical Factors:    Risk Reduction Factors:     CLINICAL FACTORS:   Severe Anxiety and/or Agitation Bipolar Disorder:   Depressive phase Depression:   Anhedonia Comorbid alcohol abuse/dependence Hopelessness Impulsivity Insomnia Recent sense of peace/wellbeing Severe Alcohol/Substance Abuse/Dependencies More than one psychiatric diagnosis Unstable or Poor Therapeutic Relationship Previous Psychiatric Diagnoses and Treatments Medical Diagnoses and Treatments/Surgeries  COGNITIVE FEATURES THAT CONTRIBUTE TO RISK:  Closed-mindedness Loss of executive function Polarized thinking Thought constriction (tunnel vision)    SUICIDE RISK:   Moderate:  Frequent suicidal ideation with limited intensity, and duration, some specificity in terms of plans, no associated intent, good self-control, limited dysphoria/symptomatology, some risk factors present, and identifiable protective factors, including available and accessible social support.  PLAN OF CARE:admit voluntarily and emergently for depression with suicidal ideation and polysubstance abuse(cocaine, marijuana and alcohol)  I certify that inpatient services furnished can reasonably be expected to improve the patient's condition.   Gudrun Axe,JANARDHAHA R. 05/27/2013, 12:21 PM

## 2013-05-27 NOTE — Progress Notes (Signed)
Patient ID: Diamond Rice, female   DOB: [birth year], 42 y.o.   MRN: [id number] Patient asleep; no s/s of distress noted. Respirations regular and unlabored.

## 2013-05-27 NOTE — Progress Notes (Signed)
Patient ID: Diamond Rice, female   DOB: 14-Dec-1970, 41 y.o.   MRN: [id number] D- Patient reports she slept with medication and her appetite is good.  Her energy level is low.  She rates her depression and her hopelessness at 10.  She reports hearing voices and says they are hard to explain.  She was very tearful this am, sobbing at one point.  Sh is sad to be missing her sister-in laws funeral today.  A supported patient.  R- patient has been attending groups.  Talked with her about her dietary choices tomanage her diabetes.

## 2013-05-27 NOTE — Progress Notes (Signed)
Recreation Therapy Notes  Date: 09.30.2014 Time: 2:45pm Location: [address] Dayroom  Group Topic: Software engineer Activities (AAA)  Behavioral Response: Engaged, Attentive, Appropriate  Affect: Euthymic  Clinical Observations/Feedback: Dog Team: Education officer, museum. Patient interacted appropriately with peer, dog team, LRT and MHT.   Marykay Lex Teale Goodgame, LRT/CTRS   Rumi Taras L 05/27/2013 4:18 PM

## 2013-05-27 NOTE — [facility] Group Notes (Signed)
Adult Psychoeducational Group Note  Date:  05/27/2013 Time:  9:47 PM  Group Topic/Focus:  Wrap-Up Group:   The focus of this group is to help patients review their daily goal of treatment and discuss progress on daily workbooks.  Participation Level:  Active  Participation Quality:  Appropriate  Affect:  Appropriate  Cognitive:  Appropriate  Insight: Appropriate  Engagement in Group:  Engaged  Modes of Intervention:  Discussion  Additional Comments:  Diamond Rice was fully engaged in group.  [name] A 05/27/2013, 9:47 PM
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
## 2013-05-27 NOTE — [facility] Group Notes (Signed)
[facility] LCSW Group Therapy      Feelings About Diagnosis 1:15 - 2:30 PM         05/27/2013  3:05 PM    Type of Therapy:  Group Therapy  Participation Level:  Minimal  Participation Quality:  Appropriate  Affect:  Appropriate, Depressed  Cognitive:  Alert and Appropriate  Insight:  Developing/Improving  Engagement in Therapy:  Developing/Improving  Modes of Intervention:  Discussion, Education, Exploration, Problem-Solving, Rapport Building, Support  Summary of Progress/Problems:  Patient listened attentively but stated she does not feel ready to participate in group discussion. Wynn Banker 05/27/2013  3:05 PM

## 2013-05-28 DIAGNOSIS — F1220 Cannabis dependence, uncomplicated: Secondary | ICD-10-CM

## 2013-05-28 DIAGNOSIS — F1410 Cocaine abuse, uncomplicated: Secondary | ICD-10-CM

## 2013-05-28 DIAGNOSIS — F1414 Cocaine abuse with cocaine-induced mood disorder: Secondary | ICD-10-CM | POA: Diagnosis present

## 2013-05-28 DIAGNOSIS — F12280 Cannabis dependence with cannabis-induced anxiety disorder: Secondary | ICD-10-CM | POA: Diagnosis present

## 2013-05-28 DIAGNOSIS — F1994 Other psychoactive substance use, unspecified with psychoactive substance-induced mood disorder: Secondary | ICD-10-CM | POA: Diagnosis present

## 2013-05-28 LAB — GLUCOSE, CAPILLARY
Glucose-Capillary: 184 mg/dL — ABNORMAL HIGH (ref 70–99)
Glucose-Capillary: 304 mg/dL — ABNORMAL HIGH (ref 70–99)

## 2013-05-28 NOTE — Progress Notes (Signed)
[facility] MD Progress Note  05/28/2013 1:38 PM Diamond Rice  MRN:  [id number] Subjective:  Patient has been suffering with symptoms of depression, anxiety and substance abuse versus dependence. Patient has been homeless and unable to afford her medical care even for diabetes and high blood pressure. Patient endorses using cocaine and marijuana on a regular basis. Patient treated her depression as 9/10, anxiety 9/10 and passive suicidal ideation but no homicidal ideations. Patient is a dysphoric during this evaluation. Patient [facility] and also needed placement issues.  Diagnosis:   DSM5: Schizophrenia Disorders:   Obsessive-Compulsive Disorders:   Trauma-Stressor Disorders:   Substance/Addictive Disorders:  Cannabis Use Disorder - Severe (304.30) Depressive Disorders:  Major Depressive Disorder - Unspecified (296.20)  Axis I: Substance Induced Mood Disorder and Cocaine abuse and marijuana dependence  ADL's:  Impaired  Sleep: Poor  Appetite:  Fair  Suicidal Ideation:  Patient endorses suicidal thoughts but contract for safety Homicidal Ideation:  Denied AEB (as evidenced by):  Psychiatric Specialty Exam: ROS  Blood pressure 130/119, pulse 85, temperature 98.1 F (36.7 C), temperature source Oral, resp. rate 16, height 5\' 1"  (1.549 m), weight 108.863 kg (240 lb), last menstrual period 05/18/2013.Body mass index is 45.37 kg/(m^2).  General Appearance: Disheveled and Guarded  Eye Contact::  Minimal  Speech:  Clear and Coherent and Slow  Volume:  Decreased  Mood:  Dysphoric  Affect:  Depressed, Flat and Tearful  Thought Process:  Goal Directed and Intact  Orientation:  Full (Time, Place, and Person)  Thought Content:  Rumination  Suicidal Thoughts:  Yes.  without intent/plan  Homicidal Thoughts:  No  Memory:  Immediate;   Fair  Judgement:  Impaired  Insight:  Lacking  Psychomotor Activity:  Psychomotor Retardation and Restlessness   Concentration:  Fair  Recall:  Fair  Akathisia:  NA  Handed:  Right  AIMS (if indicated):     Assets:  Communication Skills Desire for Improvement Resilience  Sleep:  Number of Hours: 6.75   Current Medications: Current Facility-Administered Medications  Medication Dose Route Frequency Provider Last Rate Last Dose  . acetaminophen (TYLENOL) tablet 650 mg  650 mg Oral Q6H PRN Verne Spurr, PA-C   650 mg at 05/27/13 0912  . alum & mag hydroxide-simeth (MAALOX/MYLANTA) 200-200-20 MG/5ML suspension 30 mL  30 mL Oral Q4H PRN Verne Spurr, PA-C      . amLODipine (NORVASC) tablet 10 mg  10 mg Oral Daily Verne Spurr, PA-C   10 mg at 05/28/13 0743  . aspirin EC tablet 81 mg  81 mg Oral Daily Verne Spurr, PA-C   81 mg at 05/28/13 0743  . atorvastatin (LIPITOR) tablet 20 mg  20 mg Oral q1800 Verne Spurr, PA-C   20 mg at 05/27/13 1723  . buPROPion (WELLBUTRIN XL) 24 hr tablet 150 mg  150 mg Oral Daily Verne Spurr, PA-C   150 mg at 05/28/13 0743  . hydrALAZINE (APRESOLINE) tablet 5 mg  5 mg Oral QID PRN Verne Spurr, PA-C   5 mg at 05/27/13 2125  . hydrochlorothiazide (HYDRODIURIL) tablet 25 mg  25 mg Oral Daily Verne Spurr, PA-C   25 mg at 05/28/13 0743  . insulin aspart (novoLOG) injection 0-15 Units  0-15 Units Subcutaneous TID WC Verne Spurr, PA-C   3 Units at 05/28/13 1209  . insulin aspart (novoLOG) injection 0-5 Units  0-5 Units Subcutaneous QHS Verne Spurr, PA-C   2 Units at 05/27/13 2127  . lisinopril (PRINIVIL,ZESTRIL) tablet 20  mg  20 mg Oral Daily Verne Spurr, PA-C   20 mg at 05/28/13 0743  . magnesium hydroxide (MILK OF MAGNESIA) suspension 30 mL  30 mL Oral Daily PRN Verne Spurr, PA-C      . metoprolol tartrate (LOPRESSOR) tablet 12.5 mg  12.5 mg Oral BID Verne Spurr, PA-C   12.5 mg at 05/28/13 0743  . traZODone (DESYREL) tablet 50 mg  50 mg Oral QHS PRN,MR X 1 Evanna Janann August, NP   50 mg at 05/27/13 2125    Lab Results:  Results for orders placed during  the hospital encounter of 05/26/13 (from the past 48 hour(s))  GLUCOSE, CAPILLARY     Status: Abnormal   Collection Time    05/26/13  9:10 PM      Result Value Range   Glucose-Capillary 254 (*) 70 - 99 mg/dL   Comment 1 Notify RN    GLUCOSE, CAPILLARY     Status: Abnormal   Collection Time    05/27/13  6:20 AM      Result Value Range   Glucose-Capillary 214 (*) 70 - 99 mg/dL  GLUCOSE, CAPILLARY     Status: Abnormal   Collection Time    05/27/13 12:00 PM      Result Value Range   Glucose-Capillary 221 (*) 70 - 99 mg/dL   Comment 1 Notify RN    GLUCOSE, CAPILLARY     Status: Abnormal   Collection Time    05/27/13  5:11 PM      Result Value Range   Glucose-Capillary 374 (*) 70 - 99 mg/dL   Comment 1 Notify RN    GLUCOSE, CAPILLARY     Status: Abnormal   Collection Time    05/27/13  9:01 PM      Result Value Range   Glucose-Capillary 243 (*) 70 - 99 mg/dL  GLUCOSE, CAPILLARY     Status: Abnormal   Collection Time    05/28/13  6:35 AM      Result Value Range   Glucose-Capillary 184 (*) 70 - 99 mg/dL    Physical Findings: AIMS: Facial and Oral Movements Muscles of Facial Expression: None, normal Lips and Perioral Area: None, normal Jaw: None, normal Tongue: None, normal,Extremity Movements Upper (arms, wrists, hands, fingers): None, normal Lower (legs, knees, ankles, toes): None, normal, Trunk Movements Neck, shoulders, hips: None, normal, Overall Severity Severity of abnormal movements (highest score from questions above): None, normal Incapacitation due to abnormal movements: None, normal Patient's awareness of abnormal movements (rate only patient's report): No Awareness, Dental Status Current problems with teeth and/or dentures?: No Does patient usually wear dentures?: No  CIWA:  CIWA-Ar Total: 0 COWS:  COWS Total Score: 1  Treatment Plan Summary: Daily contact with patient to assess and evaluate symptoms and progress in treatment Medication  management  Plan: Treatment Plan/Recommendations:  1. Admit for crisis management and stabilization. 2. Medication management to reduce current symptoms to base line and improve the patient's overall level of functioning. Continue Wellbutrin and trazodone and medication for high blood pressure and diabetes. 3. Treat health problems as indicated. 4. Develop treatment plan to decrease risk of relapse upon discharge and to reduce the need for readmission. 5. Psycho-social education regarding relapse prevention and self care. 6. Health care follow up as needed for medical problems. 7. Restart home medications where appropriate. 8. disposition plans in progress   Medical Decision Making Problem Points:  Established problem, worsening (2), Review of last therapy session (1) and Review of  psycho-social stressors (1) Data Points:  Review or order clinical lab tests (1) Review or order medicine tests (1) Review of medication regiment & side effects (2) Review of new medications or change in dosage (2)  I certify that inpatient services furnished can reasonably be expected to improve the patient's condition.   Kimani Bedoya,JANARDHAHA R. 05/28/2013, 1:38 PM

## 2013-05-28 NOTE — Progress Notes (Signed)
D: Patient denies SI/HI and auditory and visual hallucinations. The patient has a depressed mood and affect. The patient's blood pressure has been elevated today (BP 153/105, HR 85). Patient currently denies any headaches, seeing spots, and nausea/vomiting. The patient is reporting dizziness and was made a moderate fall risk because of this. The patient is reporting that her depression and anxiety are "better today" but that she is worried about not having a home to go to when discharged. The patient is attending groups on the unit and is interacting appropriately within the milieu.  A: Patient given emotional support from RN. Patient encouraged to come to staff with concerns and/or questions. Patient's medication routine continued. Patient's orders and plan of care reviewed. MD/PA notified of patient's blood pressure. Patient referred to case management for follow-up plan of care.  R: Patient remains cooperative. Will continue to monitor patient's blood pressure. Will continue to monitor patient q15 minutes for safety.

## 2013-05-28 NOTE — Progress Notes (Signed)
Adult Psychoeducational Group Note  Date:  05/28/2013 Time:  10:00am Group Topic/Focus:  Therapeutic Activity-Discussion on anger and anger management. What anger mean to each patient and how to manage their anger.  Participation Level:  Minimal  Participation Quality:  Appropriate  Affect:  Flat  Cognitive:  Alert and Appropriate  Insight: Limited  Engagement in Group:  Limited  Modes of Intervention:  Discussion and Education  Additional Comments:  Pt attended and participated in group. When ask what anger means to her pt stated everything plisses me off. Pt did not participate anymore after then but was actively listening.  Shelly Bombard D 05/28/2013, 11:20 AM

## 2013-05-28 NOTE — Tx Team (Signed)
Interdisciplinary Treatment Plan Update   Date Reviewed:  05/28/2013  Time Reviewed:  9:41 AM  Progress in Treatment:   Attending groups: Yes Participating in groups: Yes Taking medication as prescribed: Yes  Tolerating medication: Yes Family/Significant other contact made: No, patient declines collateral contact. Patient understands diagnosis: Yes  Discussing patient identified problems/goals with staff: Yes Medical problems stabilized or resolved: Yes Denies suicidal/homicidal ideation: Yes Patient has not harmed self or others: Yes  For review of initial/current patient goals, please see plan of care.  Estimated Length of Stay:  3-5 days  Reasons for Continued Hospitalization:  Anxiety Depression Medication stabilization   New Problems/Goals identified:    Discharge Plan or Barriers:   Home with outpatient follow up [facility]  Additional Comments:   Patient is a 42 year old African American single unemployed currently homeless female who was admitted on the medical floor because of chest pain. Patient endorse suicidal thoughts and require a psych consult. Patient endorsed increased depression for past few months. 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 Patient admitted she has difficulty keeping her job but also for anger and irritability. She is noncompliant with her psychiatric medication for past 2 years.    Attendees:  Patient:  05/28/2013 9:41 AM   Signature: Mervyn Gay, MD 05/28/2013 9:41 AM  Signature:  Verne Spurr, PA 05/28/2013 9:41 AM  Signature: Harold Barban, RN 05/28/2013 9:41 AM  Signature: Nestor Ramp, RN 05/28/2013 9:41 AM  Signature:  05/28/2013 9:41 AM  Signature:  Juline Patch, LCSW 05/28/2013 9:41 AM  Signature:  Reyes Ivan, LCSW 05/28/2013 9:41 AM  Signature:  Maseta Dorley,Care Coordinator 05/28/2013 9:41 AM  Signature:   05/28/2013 9:41 AM  Signature:  05/28/2013  9:41 AM  Signature:    Signature:      Scribe for Treatment Team:    Juline Patch,  05/28/2013 9:41 AM

## 2013-05-28 NOTE — Progress Notes (Signed)
Recreation Therapy Notes  Date: 10.01.2014 Time: 3:00pm Location: [address] Dayroom  Group Topic: Coping Skills  Goal Area(s) Addresses:  Patient will identify aspects of their life they are grateful for.  Patient will verbalize importance of recognizing things to be grateful for.  Behavioral Response: Appropriate  Intervention: Air traffic controller  Activity: [employer]. Patient provided a worksheet with various categories of things to be grateful for (nature, work, life, play, health, this moment). Patient was asked to identify at least two things they are personally grateful for to fit in each category.   Education: Discharge Planning, Coping Skills   Education Outcome: Acknowledges understanding  Clinical Observations/Feedback: Patient completed worksheet as requested. Patient made no contributions to group discussion.  Diamond Rice, LRT/CTRS  Diamond Rice L 05/28/2013 4:55 PM

## 2013-05-28 NOTE — [facility] Group Notes (Addendum)
[facility] LCSW Aftercare Discharge Planning Group Note   05/28/2013 9:48 AM    Participation Quality:  Appropraite  Mood/Affect:  Appropriate, Depressed, Flat  Depression Rating:  8  Anxiety Rating:  8  Thoughts of Suicide:  No  Will you contract for safety?   NA  Current AVH:  No  Plan for Discharge/Comments:  Patient attending discharge planning group and actively participated in group. Patient reports not feeling well due to having and headache and blood pressure problems.  She advised she had been abusing crack cocaine until a few weeks ago and fears she will relapse if she discharges back to the community.  She agreed to a referral to [facility]. CSW provided all participants with daily workbook.   Transportation Means: Patient has transportation.   Supports:  Patient has a support system.   Beverley Allender, Joesph July

## 2013-05-28 NOTE — Progress Notes (Signed)
Adult Psychoeducational Group Note  Date:  05/28/2013 Time:  5:39 PM  Group Topic/Focus:  Personal Choices and Values:   The focus of this group is to help patients assess and explore the importance of values in their lives, how their values affect their decisions, how they express their values and what opposes their expression.  Participation Level:  Minimal  Participation Quality:  Appropriate  Affect:  Appropriate and Flat  Cognitive:  Appropriate  Insight: Limited  Engagement in Group:  Limited  Modes of Intervention:  Education and Support  Additional Comments:  Pt attended group but made no verbal contributions to group discussion. Pt actively participated in group by completing worksheets associated with group topic.  Reinaldo Raddle K 05/28/2013, 5:39 PM

## 2013-05-28 NOTE — Progress Notes (Signed)
Patient ID: Diamond Rice, female   DOB: 1970/10/08, 42 y.o.   MRN: [id number]  D: Patient lying in bed with eyes closed. Respirations even and non-labored. A: Staff will monitor on q 15 minute checks, follow treatment plan, and give meds as ordered. R: Appears asleep at present

## 2013-05-28 NOTE — [facility] Group Notes (Signed)
[facility] LCSW Group Therapy  Emotional Regulation 1:15 - 2: 30 PM        05/28/2013  3:46 PM   Type of Therapy:  Group Therapy  Participation Level:  Appropriate  Participation Quality:  Appropriate  Affect: Depressed Tearful  Cognitive:  Attentive Appropriate  Insight:  Developing/Improving  Engagement in Therapy:  Developing/Improving  Modes of Intervention:  Discussion Exploration Problem-Solving Supportive  Summary of Progress/Problems:  Group topic was emotional regulations.  Patient participated in the discussion and was able to identify an emotion that needed to regulated.  She talked about the frustration she feels from trying to do the right thing but never getting ahead. Patient encouraged to keep trying and know there will be obstacles that get in the way but she can't give up  Wynn Banker 05/28/2013 3:46 PM

## 2013-05-29 LAB — GLUCOSE, CAPILLARY: Glucose-Capillary: 256 mg/dL — ABNORMAL HIGH (ref 70–99)

## 2013-05-29 MED ORDER — IBUPROFEN 600 MG PO TABS: 600.0000 mg | ORAL_TABLET | Freq: Four times a day (QID) | ORAL | Status: DC | PRN

## 2013-05-29 MED ORDER — FLUOXETINE HCL 20 MG PO CAPS
20.0000 mg | ORAL_CAPSULE | Freq: Every day | ORAL | Status: DC
  Administered 2013-05-29 – 2013-06-03 (×6): 20 mg via ORAL
  Filled 2013-05-29 (×3): qty 1
  Filled 2013-05-29: qty 14
  Filled 2013-05-29 (×4): qty 1
  Filled 2013-05-29: qty 14

## 2013-05-29 MED ORDER — HYDROXYZINE HCL 50 MG PO TABS
50.0000 mg | ORAL_TABLET | Freq: Two times a day (BID) | ORAL | Status: DC
  Administered 2013-05-29 – 2013-06-03 (×8): 50 mg via ORAL
  Filled 2013-05-29 (×4): qty 1
  Filled 2013-05-29 (×3): qty 28
  Filled 2013-05-29 (×8): qty 1
  Filled 2013-05-29: qty 28
  Filled 2013-05-29 (×2): qty 1

## 2013-05-29 NOTE — Progress Notes (Signed)
D: Patient denies SI/HI and auditory and visual hallucinations. The patient has a depressed mood and affect. The patient's blood pressure continues to be elevated (BP, HR). Patient currently denies any chest pain, headaches, seeing spots, and nausea/vomiting. The patient is reporting that her depression and anxiety are "a little better today" and that she is working on coping skills to cope with stress. The patient is attending groups on the unit and is interacting appropriately within the milieu.   A: Patient given emotional support from RN. Patient encouraged to come to staff with concerns and/or questions. Patient's medication routine continued. Patient's orders and plan of care reviewed. MD/PA aware of patient's blood pressure.  R: Patient remains cooperative. Will continue to monitor patient's blood pressure. Will continue to monitor patient q15 minutes for safety.

## 2013-05-29 NOTE — Progress Notes (Signed)
Patient ID: Diamond Rice, female   DOB: 1970-12-28, 42 y.o.   MRN: [id number] [facility] MD Progress Note  05/29/2013 1:41 PM Diamond Rice  MRN:  [id number]  Subjective:  Patient states that she has problems, can't fight alone and needs help. Patient has been homeless and unable to afford her medical care even for diabetes and high blood pressure. She was medically hospitalized at least twice in the last six months with chest pains and has placed on several blood pressure medications and insulin over six months ago. She has one best friend and no current job. She worked last about three years ago. Patient states abuse of cocaine about three weeks ago and marijuana on a regular basis. Patient treated her depression as 9/10, anxiety 8/10 and passive suicidal ideation but no homicidal ideations. Patient requested long term rehab substance abuse treatment program and also needed placement.  Diagnosis:   DSM5: Schizophrenia Disorders:   Obsessive-Compulsive Disorders:   Trauma-Stressor Disorders:   Substance/Addictive Disorders:  Cannabis Use Disorder - Severe (304.30) Depressive Disorders:  Major Depressive Disorder - Unspecified (296.20)  Axis I: Substance Induced Mood Disorder and Cocaine abuse and marijuana dependence  ADL's:  Impaired  Sleep: Poor  Appetite:  Fair  Suicidal Ideation:  Patient endorses suicidal thoughts but contract for safety Homicidal Ideation:  Denied AEB (as evidenced by):  Psychiatric Specialty Exam: ROS  Blood pressure 140/95, pulse 83, temperature 98.5 F (36.9 C), temperature source Oral, resp. rate 20, height 5\' 1"  (1.549 m), weight 108.863 kg (240 lb), last menstrual period 05/18/2013.Body mass index is 45.37 kg/(m^2).  General Appearance: Disheveled and Guarded  Eye Contact::  Minimal  Speech:  Clear and Coherent and Slow  Volume:  Decreased  Mood:  Dysphoric  Affect:  Depressed, Flat and Tearful  Thought Process:  Goal Directed and Intact  Orientation:   Full (Time, Place, and Person)  Thought Content:  Rumination  Suicidal Thoughts:  Yes.  without intent/plan  Homicidal Thoughts:  No  Memory:  Immediate;   Fair  Judgement:  Impaired  Insight:  Lacking  Psychomotor Activity:  Psychomotor Retardation and Restlessness  Concentration:  Fair  Recall:  Fair  Akathisia:  NA  Handed:  Right  AIMS (if indicated):     Assets:  Communication Skills Desire for Improvement Resilience  Sleep:  Number of Hours: 5.75   Current Medications: Current Facility-Administered Medications  Medication Dose Route Frequency Provider Last Rate Last Dose  . acetaminophen (TYLENOL) tablet 650 mg  650 mg Oral Q6H PRN Verne Spurr, PA-C   650 mg at 05/27/13 0912  . alum & mag hydroxide-simeth (MAALOX/MYLANTA) 200-200-20 MG/5ML suspension 30 mL  30 mL Oral Q4H PRN Verne Spurr, PA-C      . amLODipine (NORVASC) tablet 10 mg  10 mg Oral Daily Verne Spurr, PA-C   10 mg at 05/29/13 9528  . aspirin EC tablet 81 mg  81 mg Oral Daily Verne Spurr, PA-C   81 mg at 05/29/13 4132  . atorvastatin (LIPITOR) tablet 20 mg  20 mg Oral q1800 Verne Spurr, PA-C   20 mg at 05/28/13 1656  . FLUoxetine (PROZAC) capsule 20 mg  20 mg Oral Daily Nehemiah Settle, MD      . hydrALAZINE (APRESOLINE) tablet 5 mg  5 mg Oral QID PRN Verne Spurr, PA-C   5 mg at 05/27/13 2125  . hydrochlorothiazide (HYDRODIURIL) tablet 25 mg  25 mg Oral Daily Verne Spurr, PA-C   25 mg at 05/29/13 4401  .  hydrOXYzine (ATARAX/VISTARIL) tablet 50 mg  50 mg Oral BID Nehemiah Settle, MD      . ibuprofen (ADVIL,MOTRIN) tablet 600 mg  600 mg Oral Q6H PRN Nehemiah Settle, MD      . insulin aspart (novoLOG) injection 0-15 Units  0-15 Units Subcutaneous TID WC Verne Spurr, PA-C   8 Units at 05/29/13 1158  . insulin aspart (novoLOG) injection 0-5 Units  0-5 Units Subcutaneous QHS Verne Spurr, PA-C   3 Units at 05/28/13 2128  . lisinopril (PRINIVIL,ZESTRIL) tablet 20 mg  20 mg  Oral Daily Verne Spurr, PA-C   20 mg at 05/29/13 9629  . magnesium hydroxide (MILK OF MAGNESIA) suspension 30 mL  30 mL Oral Daily PRN Verne Spurr, PA-C      . metoprolol tartrate (LOPRESSOR) tablet 12.5 mg  12.5 mg Oral BID Verne Spurr, PA-C   12.5 mg at 05/29/13 0733  . traZODone (DESYREL) tablet 50 mg  50 mg Oral QHS PRN,MR X 1 Evanna Janann August, NP   50 mg at 05/28/13 2259    Lab Results:  Results for orders placed during the hospital encounter of 05/26/13 (from the past 48 hour(s))  GLUCOSE, CAPILLARY     Status: Abnormal   Collection Time    05/27/13  5:11 PM      Result Value Range   Glucose-Capillary 374 (*) 70 - 99 mg/dL   Comment 1 Notify RN    GLUCOSE, CAPILLARY     Status: Abnormal   Collection Time    05/27/13  9:01 PM      Result Value Range   Glucose-Capillary 243 (*) 70 - 99 mg/dL  GLUCOSE, CAPILLARY     Status: Abnormal   Collection Time    05/28/13  6:35 AM      Result Value Range   Glucose-Capillary 184 (*) 70 - 99 mg/dL  GLUCOSE, CAPILLARY     Status: Abnormal   Collection Time    05/28/13  4:52 PM      Result Value Range   Glucose-Capillary 304 (*) 70 - 99 mg/dL   Comment 1 Notify RN    GLUCOSE, CAPILLARY     Status: Abnormal   Collection Time    05/28/13  8:42 PM      Result Value Range   Glucose-Capillary 286 (*) 70 - 99 mg/dL  GLUCOSE, CAPILLARY     Status: Abnormal   Collection Time    05/29/13  6:25 AM      Result Value Range   Glucose-Capillary 218 (*) 70 - 99 mg/dL    Physical Findings: AIMS: Facial and Oral Movements Muscles of Facial Expression: None, normal Lips and Perioral Area: None, normal Jaw: None, normal Tongue: None, normal,Extremity Movements Upper (arms, wrists, hands, fingers): None, normal Lower (legs, knees, ankles, toes): None, normal, Trunk Movements Neck, shoulders, hips: None, normal, Overall Severity Severity of abnormal movements (highest score from questions above): None, normal Incapacitation due to  abnormal movements: None, normal Patient's awareness of abnormal movements (rate only patient's report): No Awareness, Dental Status Current problems with teeth and/or dentures?: No Does patient usually wear dentures?: No  CIWA:  CIWA-Ar Total: 0 COWS:  COWS Total Score: 1  Treatment Plan Summary: Daily contact with patient to assess and evaluate symptoms and progress in treatment Medication management  Plan: Discontinue Wellbutrin secondary to no benefits and high blood pressure Fluoxetine 20 mg daily for depression Hydroxyzine 50 mg twice daily for anxiety Ibuprofen 800 mg every 8 hours as  needed for headaches Treatment Plan/Recommendations:  1. Admit for crisis management and stabilization. 2. Medication management to reduce current symptoms to base line and improve the patient's overall level of functioning. Continue Wellbutrin and trazodone and medication for high blood pressure and diabetes. 3. Treat health problems as indicated. 4. Develop treatment plan to decrease risk of relapse upon discharge and to reduce the need for readmission. 5. Psycho-social education regarding relapse prevention and self care. 6. Health care follow up as needed for medical problems. 7. Restart home medications where appropriate. 8. Disposition plans in progress, may discharge on Monday when she can contract for safety.   Medical Decision Making Problem Points:  Established problem, worsening (2), Review of last therapy session (1) and Review of psycho-social stressors (1) Data Points:  Review or order clinical lab tests (1) Review or order medicine tests (1) Review of medication regiment & side effects (2) Review of new medications or change in dosage (2)  I certify that inpatient services furnished can reasonably be expected to improve the patient's condition.   Tayelor Osborne,JANARDHAHA R. 05/29/2013, 1:41 PM

## 2013-05-29 NOTE — Progress Notes (Signed)
Adult Psychoeducational Group Note  Date:  05/29/2013 Time:  4:45 PM  Group Topic/Focus:  Building Self Esteem:   The Focus of this group is helping patients become aware of the effects of self-esteem on their lives, the things they and others do that enhance or undermine their self-esteem, seeing the relationship between their level of self-esteem and the choices they make and learning ways to enhance self-esteem.  Participation Level:  Minimal  Participation Quality:  Appropriate and Attentive  Affect:  Appropriate  Cognitive:  Alert  Insight: Lacking  Engagement in Group:  Engaged  Modes of Intervention:  Activity, Discussion, Exploration, Socialization and Support  Additional Comments:  Pt came to group and shared that anxiety and depression are effecting her self-esteem. Pt plans on changing this by going to therapy and telling herself positive things she believes about herself.  Diamond Rice 05/29/2013, 4:45 PM

## 2013-05-29 NOTE — [facility] Group Notes (Signed)
Adult Psychoeducational Group Note  Date:  05/29/2013 Time:  12:08 AM  Group Topic/Focus:  Wrap-Up Group:   The focus of this group is to help patients review their daily goal of treatment and discuss progress on daily workbooks.  Participation Level:  Active  Participation Quality:  Appropriate  Affect:  Appropriate  Cognitive:  Appropriate  Insight: Appropriate  Engagement in Group:  Engaged  Modes of Intervention:  Discussion  Additional Comments:  Mandeep stated her day started off not so good.  She had a visit from a friend.  She expressed that she opened up more today and talked about her feelings.  She stated that she wanted to open up more.  [name] A 05/29/2013, 12:08 AM

## 2013-05-29 NOTE — [facility] Group Notes (Signed)
[facility] LCSW Group Therapy  [facility] 1:15 - 2:30 PM  05/29/2013   Type of Therapy:  Group Therapy  Participation Level:  Patient was meeting with MD.   Diamond Rice 05/29/2013

## 2013-05-29 NOTE — Progress Notes (Signed)
Patient ID: Diamond Rice, female   DOB: 30-Dec-1970, 42 y.o.   MRN: [id number] D: Patient in room on approach. Pt mood/affect appeared depressed and flat. Pt stated increased depression since she lost her job 6 months ago and mounting medical issues. Pt stated "worried about her elevated blood pressure,  family history of strokes, diabetics and not able to afford her medications". Pt endorses passive suicidal ideation but contracts for safety. Pt denies HI/AVH and pain. Pt attended evening wrap up group and engaged in discussion. Pt denies any needs or concerns.  Cooperative with assessment. No acute distressed noted at this time.   A: Met with pt 1:1. Medications administered as prescribed. Writer encouraged pt to discuss feelings. Pt encouraged to come to staff with any question or concerns.     R: Patient remains safe. She is complaint with medications and denies any adverse reaction. Continue current POC.

## 2013-05-29 NOTE — Progress Notes (Signed)
Patient ID: Diamond Rice, female   DOB: 31-May-1971, 42 y.o.   MRN: [id number] D: Patient in room on approach. Pt mood/affect appeared depressed and flat. Pt stated increased depression since she lost her job 6 months ago and mounting medical issues. Pt stated unsure where she is going after discharge from [facility]. Pt stated "worried about being on the street and end up dead".  Pt endorses passive suicidal ideation but contracts for safety. Pt denies HI/AVH and pain. Pt has been in bed most of the evening stating "feeling tired". Pt denies any needs or concerns.  Cooperative with assessment. No acute distressed noted at this time.   A: Met with pt 1:1. Medications administered as prescribed. Writer encouraged pt to discuss feelings. Pt encouraged to come to staff with any question or concerns.     R: Patient remains safe. She is complaint with medications and denies any adverse reaction. Continue current POC.

## 2013-05-29 NOTE — Progress Notes (Signed)
Adult Psychoeducational Group Note  Date:  05/29/2013 Time:  10:00am Group Topic/Focus:  Therapeutic Activity-Boundaries  Participation Level:  Minimal  Participation Quality:  Appropriate and Attentive  Affect:  Appropriate  Cognitive:  Alert and Appropriate  Insight: Appropriate  Engagement in Group:  Engaged  Modes of Intervention:  Discussion and Education  Additional Comments:  Pt attended and participated in group with minimal input. Pt stated boundaries are limits that she set that people cross and make her angry.  Shelly Bombard D 05/29/2013, 1:22 PM

## 2013-05-29 NOTE — Progress Notes (Signed)
Recreation Therapy Notes  Date: 10.02.2014 Time: 2:45pom Location: [address] Dayroom  Group Topic: Software engineer Activities (AAA)  Behavioral Response: Engaged, Attentive, Appropriate  Affect: Euthymic  Clinical Observations/Feedback: Dog Team: [employer] Patient interacted appropriately with peer, dog team, LRT and MHT.   Marykay Lex Taquan Bralley, LRT/CTRS  Jearl Klinefelter 05/29/2013 5:28 PM

## 2013-05-30 LAB — GLUCOSE, CAPILLARY
Glucose-Capillary: 254 mg/dL — ABNORMAL HIGH (ref 70–99)
Glucose-Capillary: 273 mg/dL — ABNORMAL HIGH (ref 70–99)
Glucose-Capillary: 245 mg/dL — ABNORMAL HIGH (ref 70–99)

## 2013-05-30 NOTE — Progress Notes (Signed)
D: Patient denies SI/HI and auditory and visual hallucinations. The patient has a depressed mood and affect. The patient's blood pressure continues to be elevated (BP 149/102, HR 80). Patient still denies any chest pain, headaches, seeing spots, and nausea/vomiting. The patient reports that her medications "still need to be adjusted" and states that she feels her depression and anxiety are "not any better." The patient is attending groups on the unit and is interacting appropriately within the milieu.   A: Patient given emotional support from RN. Patient encouraged to come to staff with concerns and/or questions. Patient's medication routine continued. Patient's orders and plan of care reviewed. MD/PA aware of patient's blood pressure.   R: Patient remains cooperative. Will continue to monitor patient's blood pressure. Will continue to monitor patient q15 minutes for safety.

## 2013-05-30 NOTE — Progress Notes (Signed)
Patient ID: Diamond Rice, female   DOB: 03-10-1971, 42 y.o.   MRN: [id number] [facility] MD Progress Note  05/30/2013 1:04 PM Diamond Rice  MRN:  [id number]  Subjective:  Patient states that she has not spoken with her case manager regarding aftercare plans but she has interested to talk to the case manager regarding placement issues and substance abuse treatment program in long-term facility. Patient has been homeless and unable to afford her medical care even for diabetes and high blood pressure. Patient states abuse of cocaine about three weeks ago and marijuana on a regular basis. Patient treated her depression as 8/10, anxiety 8/10 and passive suicidal ideation but no homicidal ideations.   Diagnosis:   DSM5: Schizophrenia Disorders:   Obsessive-Compulsive Disorders:   Trauma-Stressor Disorders:   Substance/Addictive Disorders:  Cannabis Use Disorder - Severe (304.30) Depressive Disorders:  Major Depressive Disorder - Unspecified (296.20)  Axis I: Substance Induced Mood Disorder and Cocaine abuse and marijuana dependence  ADL's:  Impaired  Sleep: Poor  Appetite:  Fair  Suicidal Ideation:  Patient endorses suicidal thoughts but contract for safety Homicidal Ideation:  Denied AEB (as evidenced by):  Psychiatric Specialty Exam: ROS  Blood pressure 139/99, pulse 90, temperature 98.5 F (36.9 C), temperature source Oral, resp. rate 20, height 5\' 1"  (1.549 m), weight 108.863 kg (240 lb), last menstrual period 05/18/2013.Body mass index is 45.37 kg/(m^2).  General Appearance: Disheveled and Guarded  Eye Contact::  Minimal  Speech:  Clear and Coherent and Slow  Volume:  Decreased  Mood:  Dysphoric  Affect:  Depressed, Flat and Tearful  Thought Process:  Goal Directed and Intact  Orientation:  Full (Time, Place, and Person)  Thought Content:  Rumination  Suicidal Thoughts:  Yes.  without intent/plan  Homicidal Thoughts:  No  Memory:  Immediate;   Fair  Judgement:  Impaired   Insight:  Lacking  Psychomotor Activity:  Psychomotor Retardation and Restlessness  Concentration:  Fair  Recall:  Fair  Akathisia:  NA  Handed:  Right  AIMS (if indicated):     Assets:  Communication Skills Desire for Improvement Resilience  Sleep:  Number of Hours: 6.75   Current Medications: Current Facility-Administered Medications  Medication Dose Route Frequency Provider Last Rate Last Dose  . acetaminophen (TYLENOL) tablet 650 mg  650 mg Oral Q6H PRN Verne Spurr, PA-C   650 mg at 05/27/13 0912  . alum & mag hydroxide-simeth (MAALOX/MYLANTA) 200-200-20 MG/5ML suspension 30 mL  30 mL Oral Q4H PRN Verne Spurr, PA-C   30 mL at 05/30/13 0907  . amLODipine (NORVASC) tablet 10 mg  10 mg Oral Daily Verne Spurr, PA-C   10 mg at 05/30/13 0737  . aspirin EC tablet 81 mg  81 mg Oral Daily Verne Spurr, PA-C   81 mg at 05/30/13 0736  . atorvastatin (LIPITOR) tablet 20 mg  20 mg Oral q1800 Verne Spurr, PA-C   20 mg at 05/29/13 1706  . FLUoxetine (PROZAC) capsule 20 mg  20 mg Oral Daily Nehemiah Settle, MD   20 mg at 05/30/13 0737  . hydrALAZINE (APRESOLINE) tablet 5 mg  5 mg Oral QID PRN Verne Spurr, PA-C   5 mg at 05/27/13 2125  . hydrochlorothiazide (HYDRODIURIL) tablet 25 mg  25 mg Oral Daily Verne Spurr, PA-C   25 mg at 05/30/13 0736  . hydrOXYzine (ATARAX/VISTARIL) tablet 50 mg  50 mg Oral BID Nehemiah Settle, MD   50 mg at 05/30/13 0737  . ibuprofen (ADVIL,MOTRIN) tablet  600 mg  600 mg Oral Q6H PRN Nehemiah Settle, MD      . insulin aspart (novoLOG) injection 0-15 Units  0-15 Units Subcutaneous TID WC Verne Spurr, PA-C   8 Units at 05/30/13 1157  . insulin aspart (novoLOG) injection 0-5 Units  0-5 Units Subcutaneous QHS Verne Spurr, PA-C   3 Units at 05/29/13 2215  . lisinopril (PRINIVIL,ZESTRIL) tablet 20 mg  20 mg Oral Daily Verne Spurr, PA-C   20 mg at 05/30/13 0737  . magnesium hydroxide (MILK OF MAGNESIA) suspension 30 mL  30 mL Oral  Daily PRN Verne Spurr, PA-C      . metoprolol tartrate (LOPRESSOR) tablet 12.5 mg  12.5 mg Oral BID Verne Spurr, PA-C   12.5 mg at 05/30/13 0736  . traZODone (DESYREL) tablet 50 mg  50 mg Oral QHS PRN,MR X 1 Evanna Janann August, NP   50 mg at 05/29/13 2217    Lab Results:  Results for orders placed during the hospital encounter of 05/26/13 (from the past 48 hour(s))  GLUCOSE, CAPILLARY     Status: Abnormal   Collection Time    05/28/13  4:52 PM      Result Value Range   Glucose-Capillary 304 (*) 70 - 99 mg/dL   Comment 1 Notify RN    GLUCOSE, CAPILLARY     Status: Abnormal   Collection Time    05/28/13  8:42 PM      Result Value Range   Glucose-Capillary 286 (*) 70 - 99 mg/dL  GLUCOSE, CAPILLARY     Status: Abnormal   Collection Time    05/29/13  6:25 AM      Result Value Range   Glucose-Capillary 218 (*) 70 - 99 mg/dL  GLUCOSE, CAPILLARY     Status: Abnormal   Collection Time    05/29/13 11:55 AM      Result Value Range   Glucose-Capillary 286 (*) 70 - 99 mg/dL  GLUCOSE, CAPILLARY     Status: Abnormal   Collection Time    05/29/13  5:02 PM      Result Value Range   Glucose-Capillary 275 (*) 70 - 99 mg/dL  GLUCOSE, CAPILLARY     Status: Abnormal   Collection Time    05/29/13 10:01 PM      Result Value Range   Glucose-Capillary 256 (*) 70 - 99 mg/dL  GLUCOSE, CAPILLARY     Status: Abnormal   Collection Time    05/30/13  6:25 AM      Result Value Range   Glucose-Capillary 185 (*) 70 - 99 mg/dL  GLUCOSE, CAPILLARY     Status: Abnormal   Collection Time    05/30/13 11:53 AM      Result Value Range   Glucose-Capillary 254 (*) 70 - 99 mg/dL    Physical Findings: AIMS: Facial and Oral Movements Muscles of Facial Expression: None, normal Lips and Perioral Area: None, normal Jaw: None, normal Tongue: None, normal,Extremity Movements Upper (arms, wrists, hands, fingers): None, normal Lower (legs, knees, ankles, toes): None, normal, Trunk Movements Neck, shoulders,  hips: None, normal, Overall Severity Severity of abnormal movements (highest score from questions above): None, normal Incapacitation due to abnormal movements: None, normal Patient's awareness of abnormal movements (rate only patient's report): No Awareness, Dental Status Current problems with teeth and/or dentures?: No Does patient usually wear dentures?: No  CIWA:  CIWA-Ar Total: 0 COWS:  COWS Total Score: 1  Treatment Plan Summary: Daily contact with patient to assess and  evaluate symptoms and progress in treatment Medication management  Plan: Fluoxetine 20 mg daily for depression Hydroxyzine 50 mg twice daily for anxiety Ibuprofen 800 mg every 8 hours as needed for headaches Treatment Plan/Recommendations:  1. Admit for crisis management and stabilization. 2. Medication management to reduce current symptoms to base line and improve the patient's overall level of functioning. Continue Wellbutrin and trazodone and medication for high blood pressure and diabetes. 3. Treat health problems as indicated. 4. Develop treatment plan to decrease risk of relapse upon discharge and to reduce the need for readmission. 5. Psycho-social education regarding relapse prevention and self care. 6. Health care follow up as needed for medical problems. 7. Restart home medications where appropriate. 8. Disposition plans in progress, may discharge on Monday when she can contract for safety.   Medical Decision Making Problem Points:  Established problem, worsening (2), Review of last therapy session (1) and Review of psycho-social stressors (1) Data Points:  Review or order clinical lab tests (1) Review or order medicine tests (1) Review of medication regiment & side effects (2) Review of new medications or change in dosage (2)  I certify that inpatient services furnished can reasonably be expected to improve the patient's condition.   Camry Theiss,JANARDHAHA R. 05/30/2013, 1:04 PM

## 2013-05-30 NOTE — Progress Notes (Signed)
Adult Psychoeducational Group Note  Date:  05/30/2013 Time:  2:29 PM  Group Topic/Focus:  Early Warning Signs:   The focus of this group is to help patients identify signs or symptoms they exhibit before slipping into an unhealthy state or crisis.  Participation Level:  Did Not Attend  Additional Comments:  Pt slept in bed due to medications.   Cathlean Cower 05/30/2013, 2:29 PM

## 2013-05-30 NOTE — [facility] Group Notes (Signed)
Adult Psychoeducational Group Note  Date:  05/30/2013 Time:  9:26 PM  Group Topic/Focus:  Wrap-Up Group:   The focus of this group is to help patients review their daily goal of treatment and discuss progress on daily workbooks.  Participation Level:  Minimal  Participation Quality:  Attentive  Affect:  Appropriate  Cognitive:  Appropriate  Insight: Appropriate  Engagement in Group:  Limited  Modes of Intervention:  Discussion  Additional Comments:  Giavonna stated that her day was all right and her blood pressure had gone down.  [name] A 05/30/2013, 9:26 PM

## 2013-05-30 NOTE — Progress Notes (Signed)
Patient ID: DILARA NAVARRETE, female   DOB: 1971/06/09, 42 y.o.   MRN: [id number] D: Patient is calm and cooperative in room on approach. Pt mood/affect appeared depressed and flat.  Pt stated unsure where she is going after discharge from [facility].  Pt denies SI/HI/AVH and pain. Pt is observed in dayroom interacting with peers. Pt attended evening wrap up group and engaged in discussion. Pt denies any needs or concerns.  Cooperative with assessment. No acute distressed noted at this time.   A: Met with pt 1:1. Medications administered as prescribed. Writer encouraged pt to discuss feelings. Pt encouraged to come to staff with any question or concerns.     R: Patient remains safe. She is complaint with medications and denies any adverse reaction. Continue current POC.

## 2013-05-30 NOTE — [facility] Group Notes (Signed)
[facility] LCSW Group Therapy  Feelings Around Relapse 1:15 -2:30        05/30/2013  1:16 PM   Type of Therapy:  Group Therapy  Participation Level:  Appropriate  Participation Quality:  Appropriate  Affect:  Appropriate  Cognitive:  Attentive Appropriate  Insight:  Developing/Improving  Engagement in Therapy: Developing/Improving  Modes of Intervention:  Discussion Exploration Problem-Solving Supportive  Summary of Progress/Problems:  The topic for today was feelings around relapse.    Patient processed feelings toward relapse and was able to relate to peers. She shared she tends to isolation and then starts to use drugs.  Patient identified coping skills that can be used to prevent a relapse.   Diamond Rice 05/30/2013 1:16 PM

## 2013-05-30 NOTE — [facility] Group Notes (Signed)
[facility] LCSW Aftercare Discharge Planning Group Note   05/30/2013 10:37 AM    Participation Quality:  Appropraite  Mood/Affect:  Appropriate, Depressed  Depression Rating:  5  Anxiety Rating:  7  Thoughts of Suicide:  No  Will you contract for safety?   NA  Current AVH:  No  Plan for Discharge/Comments:  Patient attending discharge planning group and actively participated in group. Patient was unadvised of not being accepted for an admission assessment with [facility].  She will follow up with [facility].   CSW provided all participants with daily workbook and information on services offered by [facility].   Transportation Means: Patient has transportation.   Supports:  Patient has a support system.   Shaila Gilchrest, Joesph July

## 2013-05-31 LAB — GLUCOSE, CAPILLARY: Glucose-Capillary: 197 mg/dL — ABNORMAL HIGH (ref 70–99)

## 2013-05-31 NOTE — Progress Notes (Signed)
Adult Psychoeducational Group Note  Date:  05/31/2013 Time:  8:00 pm  Group Topic/Focus:  Wrap-Up Group:   The focus of this group is to help patients review their daily goal of treatment and discuss progress on daily workbooks.  Participation Level:  Did Not Attend  Modena Nunnery 05/31/2013, 11:20 PM

## 2013-05-31 NOTE — Progress Notes (Signed)
Patient ID: Diamond Rice, female   DOB: 1971-04-11, 42 y.o.   MRN: [id number]  D: Pt has been appropriate on the unit today, pt attended all groups and engaged in treatment. Pt reported that she was having a good day, and that she was looking forward to discharge.  Pt reported being negative SI/HI, no AH/VH noted. A: 15 min checks continued for patient safety. R: Pt safety maintained.

## 2013-05-31 NOTE — [facility] Group Notes (Signed)
[facility] Group Notes:  (Nursing/MHT/Case Management/Adjunct)  Date:  05/31/2013  Time:  3:14 PM  Type of Therapy: Psychoeducational Skills  Participation Level: Active  Participation Quality: Appropriate  Affect: Appropriate  Cognitive: Appropriate  Insight: Appropriate  Engagement in Group: Engaged  Modes of Intervention: Problem-solving  Summary of Progress/Problems: Pt attended healthy coping skills group and engaged in treatment.   Diamond Rice 05/31/2013, 3:14 PM 

## 2013-05-31 NOTE — Progress Notes (Signed)
Patient ID: Diamond Rice, female   DOB: April 27, 1971, 42 y.o.   MRN: [id number] Patient ID: Diamond Rice, female   DOB: [birth year], 42 y.o.   MRN: [id number] [facility] MD Progress Note  05/31/2013 5:58 PM Diamond Rice  MRN:  [id number]  Subjective: [name] continue to endorse increased depression and hopelessness.  She expressed some despair on her homelessness and financial stressors. She is worrying about how she is going to afford her medicine after discharge. She denies any SI today.   Diagnosis:   DSM5: Schizophrenia Disorders:   Obsessive-Compulsive Disorders:   Trauma-Stressor Disorders:   Substance/Addictive Disorders:  Cannabis Use Disorder - Severe (304.30) Depressive Disorders:  Major Depressive Disorder - Unspecified (296.20)  Axis I: Substance Induced Mood Disorder and Cocaine abuse and marijuana dependence  ADL's:  Impaired  Sleep: Poor  Appetite:  Fair  Suicidal Ideation:  Patient endorses suicidal thoughts but contract for safety Homicidal Ideation:  Denied AEB (as evidenced by):  Psychiatric Specialty Exam: Review of Systems  Constitutional: Negative.   HENT: Negative.   Eyes: Negative.   Respiratory: Negative.   Cardiovascular: Negative.        Hx. HTN  Gastrointestinal: Negative.   Genitourinary: Negative.   Skin: Negative.   Neurological: Negative.   Endo/Heme/Allergies: Negative.   Psychiatric/Behavioral: Positive for depression (currently being stabilized with medication) and substance abuse. Negative for suicidal ideas, hallucinations and memory loss. The patient is nervous/anxious (currently being stabilized with medication) and has insomnia (currently being stabilized with medication prior to discharge).     Blood pressure 155/110, pulse 98, temperature 98 F (36.7 C), temperature source Oral, resp. rate 18, height 5\' 1"  (1.549 m), weight 108.863 kg (240 lb), last menstrual period 05/18/2013.Body mass index is 45.37 kg/(m^2).  General Appearance:  Disheveled and Guarded  Eye Contact::  Minimal  Speech:  Clear and Coherent and Slow  Volume:  Decreased  Mood:  Dysphoric  Affect:  Depressed, Flat and Tearful  Thought Process:  Goal Directed and Intact  Orientation:  Full (Time, Place, and Person)  Thought Content:  Rumination  Suicidal Thoughts:  Yes.  without intent/plan  Homicidal Thoughts:  No  Memory:  Immediate;   Fair  Judgement:  Impaired  Insight:  Lacking  Psychomotor Activity:  Psychomotor Retardation and Restlessness  Concentration:  Fair  Recall:  Fair  Akathisia:  NA  Handed:  Right  AIMS (if indicated):     Assets:  Communication Skills Desire for Improvement Resilience  Sleep:  Number of Hours: 6.75   Current Medications: Current Facility-Administered Medications  Medication Dose Route Frequency Provider Last Rate Last Dose  . acetaminophen (TYLENOL) tablet 650 mg  650 mg Oral Q6H PRN Verne Spurr, PA-C   650 mg at 05/27/13 0912  . alum & mag hydroxide-simeth (MAALOX/MYLANTA) 200-200-20 MG/5ML suspension 30 mL  30 mL Oral Q4H PRN Verne Spurr, PA-C   30 mL at 05/30/13 0907  . amLODipine (NORVASC) tablet 10 mg  10 mg Oral Daily Verne Spurr, PA-C   10 mg at 05/31/13 1039  . aspirin EC tablet 81 mg  81 mg Oral Daily Verne Spurr, PA-C   81 mg at 05/31/13 1039  . atorvastatin (LIPITOR) tablet 20 mg  20 mg Oral q1800 Verne Spurr, PA-C   20 mg at 05/30/13 1635  . FLUoxetine (PROZAC) capsule 20 mg  20 mg Oral Daily Nehemiah Settle, MD   20 mg at 05/31/13 1039  . hydrALAZINE (APRESOLINE) tablet 5 mg  5 mg  Oral QID PRN Verne Spurr, PA-C   5 mg at 05/27/13 2125  . hydrochlorothiazide (HYDRODIURIL) tablet 25 mg  25 mg Oral Daily Verne Spurr, PA-C   25 mg at 05/31/13 1039  . hydrOXYzine (ATARAX/VISTARIL) tablet 50 mg  50 mg Oral BID Nehemiah Settle, MD   50 mg at 05/31/13 1039  . ibuprofen (ADVIL,MOTRIN) tablet 600 mg  600 mg Oral Q6H PRN Nehemiah Settle, MD      . insulin aspart  (novoLOG) injection 0-15 Units  0-15 Units Subcutaneous TID WC Verne Spurr, PA-C   11 Units at 05/31/13 1248  . insulin aspart (novoLOG) injection 0-5 Units  0-5 Units Subcutaneous QHS Verne Spurr, PA-C   2 Units at 05/30/13 2123  . lisinopril (PRINIVIL,ZESTRIL) tablet 20 mg  20 mg Oral Daily Verne Spurr, PA-C   20 mg at 05/31/13 1039  . magnesium hydroxide (MILK OF MAGNESIA) suspension 30 mL  30 mL Oral Daily PRN Verne Spurr, PA-C      . metoprolol tartrate (LOPRESSOR) tablet 12.5 mg  12.5 mg Oral BID Verne Spurr, PA-C   12.5 mg at 05/31/13 1040  . traZODone (DESYREL) tablet 50 mg  50 mg Oral QHS PRN,MR X 1 Evanna Janann August, NP   50 mg at 05/30/13 2241    Lab Results:  Results for orders placed during the hospital encounter of 05/26/13 (from the past 48 hour(s))  GLUCOSE, CAPILLARY     Status: Abnormal   Collection Time    05/29/13 10:01 PM      Result Value Range   Glucose-Capillary 256 (*) 70 - 99 mg/dL  GLUCOSE, CAPILLARY     Status: Abnormal   Collection Time    05/30/13  6:25 AM      Result Value Range   Glucose-Capillary 185 (*) 70 - 99 mg/dL  GLUCOSE, CAPILLARY     Status: Abnormal   Collection Time    05/30/13 11:53 AM      Result Value Range   Glucose-Capillary 254 (*) 70 - 99 mg/dL  GLUCOSE, CAPILLARY     Status: Abnormal   Collection Time    05/30/13  4:47 PM      Result Value Range   Glucose-Capillary 273 (*) 70 - 99 mg/dL   Comment 1 Documented in Chart     Comment 2 Notify RN    GLUCOSE, CAPILLARY     Status: Abnormal   Collection Time    05/30/13  8:28 PM      Result Value Range   Glucose-Capillary 245 (*) 70 - 99 mg/dL  GLUCOSE, CAPILLARY     Status: Abnormal   Collection Time    05/31/13  6:31 AM      Result Value Range   Glucose-Capillary 197 (*) 70 - 99 mg/dL  GLUCOSE, CAPILLARY     Status: Abnormal   Collection Time    05/31/13 12:10 PM      Result Value Range   Glucose-Capillary 316 (*) 70 - 99 mg/dL  GLUCOSE, CAPILLARY     Status:  Abnormal   Collection Time    05/31/13  5:11 PM      Result Value Range   Glucose-Capillary 224 (*) 70 - 99 mg/dL    Physical Findings: AIMS: Facial and Oral Movements Muscles of Facial Expression: None, normal Lips and Perioral Area: None, normal Jaw: None, normal Tongue: None, normal,Extremity Movements Upper (arms, wrists, hands, fingers): None, normal Lower (legs, knees, ankles, toes): None, normal, Trunk Movements Neck, shoulders,  hips: None, normal, Overall Severity Severity of abnormal movements (highest score from questions above): None, normal Incapacitation due to abnormal movements: None, normal Patient's awareness of abnormal movements (rate only patient's report): No Awareness, Dental Status Current problems with teeth and/or dentures?: No Does patient usually wear dentures?: No  CIWA:  CIWA-Ar Total: 0 COWS:  COWS Total Score: 1  Treatment Plan Summary: Daily contact with patient to assess and evaluate symptoms and progress in treatment Medication management  Plan: Supportive approach/coping skills/relapse prevention. Encouraged out of room, participation in group sessions and application of coping skills when distressed. Will continue to monitor response to/adverse effects of medications in use to assure effectiveness. Continue to monitor mood, behavior and interaction with staff and other patients. May benefit from referral to the [facility] center for her medical care needs after discharge from here. Continue current plan of care.   Medical Decision Making Problem Points:  Established problem, worsening (2), Review of last therapy session (1) and Review of psycho-social stressors (1) Data Points:  Review or order clinical lab tests (1) Review or order medicine tests (1) Review of medication regiment & side effects (2) Review of new medications or change in dosage (2)  I certify that inpatient services furnished can reasonably be expected to improve the  patient's condition.   Armandina Stammer I, PMHNP-BC 05/31/2013, 5:58 PM

## 2013-05-31 NOTE — [facility] Group Notes (Signed)
[facility] Group Notes:  (Clinical Social Work)  05/31/2013   3:00-4:00PM  Summary of Progress/Problems:   The main focus of today's process group was for the patient to identify ways in which they have sabotaged their own mental health wellness/recovery.  Motivational interviewing was used to explore the reasons they engage in this behavior, and reasons they may have for wanting to change.  The Stages of Change were explained to the group using a handout, and patients identified where they are with regard to changing self-defeating behaviors.  The patient expressed that she isolates herself as a means of self sabotage, and this enables her to not have to deal with other people.  She started doing this when she finished high school.  She is irritated when talking about her 8 brothers, stating that she always helped her family members but when she needed help, it was not returned.  This led to her isolation.  She is proud to have made 2 friends in the last few years.  Her motivation to change is 10 out of 10 and her confidence is 4 out of 10.  Type of Therapy:  Process Group  Participation Level:  Active  Participation Quality:  Drowsy  Affect:  Blunted  Cognitive:  Oriented  Insight:  Developing/Improving  Engagement in Therapy:  Engaged  Modes of Intervention:  Education, Motivational Interviewing   Ambrose Mantle, LCSW 05/31/2013, 5:18 PM

## 2013-06-01 LAB — GLUCOSE, CAPILLARY
Glucose-Capillary: 178 mg/dL — ABNORMAL HIGH (ref 70–99)
Glucose-Capillary: 276 mg/dL — ABNORMAL HIGH (ref 70–99)

## 2013-06-01 NOTE — Progress Notes (Signed)
Progress note was reviewed, and I concur with treatment plan.

## 2013-06-01 NOTE — Progress Notes (Signed)
Patient ID: Diamond Rice, female   DOB: November 30, 1970, 42 y.o.   MRN: [id number]  D: Pt has been appropriate on the unit today, pt reported that she was having a much better day. Pt was requesting to have a SW consult for assistance with after discharge planning care. Pt reported that she is going to need help with living arrangements and medications. Pt also reported that she needed a diabetic consult with help understanding her condition. Both consults were entered into the system. Pt went to all groups and engaged in treatment.  Pt reported being negative SI/HI, no AH/VH noted.  A: 15 min checks continued for patient safety. R: Pt safety maintained.

## 2013-06-01 NOTE — Progress Notes (Signed)
Patient ID: Diamond Rice, female   DOB: Jan 05, 1971, 42 y.o.   MRN: [id number] Psychoeducational Group Note  Date:  06/01/2013 Time:  0900am  Group Topic/Focus:  Making Healthy Choices:   The focus of this group is to help patients identify negative/unhealthy choices they were using prior to admission and identify positive/healthier coping strategies to replace them upon discharge.  Participation Level:  Did Not Attend  Participation Quality:    Affect:  Cognitive:  Insight:  Engagement in Group:Additional Comments:  Inventory group   Valente David 06/01/2013,3:42 PM

## 2013-06-01 NOTE — Progress Notes (Signed)
Patient ID: Diamond Rice, female   DOB: [birth year], 43 y.o.   MRN: [id number] D)  Has been out and about this evening, pleasant and cooperative.  Stated she slept through group, but did come out for meds afterward and a snack.  CBG ws 287, received 3 units of novalog.  Stated she had just started taking insulin this summer.  Interacting appropriately with staff and peers, denies thoughts of self harm. A)  Will continue to monitor for safety, continue POC R)  Safety maintained.

## 2013-06-01 NOTE — Progress Notes (Signed)
Patient ID: Diamond Rice, female   DOB: 1971/03/18, 42 y.o.   MRN: [id number] [facility] MD Progress Note  06/01/2013 11:22 AM Diamond Rice  MRN:  [id number]  Subjective: Diamond Rice states she is 100% better today, but can't say why she feels better. Only that she is. When asked about sleep and appetite she reports that this is all good. She rates her depression is low at 1-2/10 and her anxiety is 2/10.  [name] also notes that her Bp is getting better. She notes that she usually attends group but slept through them this morning she thinks as a result of taking her trazodone and her vistaril at bedtime. She thinks getting sleep maybe why she is better. She still has no place to go and RN has requested SW consult. Diagnosis:   DSM5: Schizophrenia Disorders:   Obsessive-Compulsive Disorders:   Trauma-Stressor Disorders:   Substance/Addictive Disorders:  Cannabis Use Disorder - Severe (304.30) Depressive Disorders:  Major Depressive Disorder - Unspecified (296.20)  Axis I: Substance Induced Mood Disorder and Cocaine abuse and marijuana dependence  ADL's:  Impaired  Sleep: Poor  Appetite:  Fair  Suicidal Ideation:  Patient endorses suicidal thoughts but contract for safety Homicidal Ideation:  Denied AEB (as evidenced by):  Psychiatric Specialty Exam: Review of Systems  Constitutional: Negative.   HENT: Negative.   Eyes: Negative.   Respiratory: Negative.   Cardiovascular: Negative.        Hx. HTN  Gastrointestinal: Negative.   Genitourinary: Negative.   Skin: Negative.   Neurological: Negative.   Endo/Heme/Allergies: Negative.   Psychiatric/Behavioral: Positive for depression (currently being stabilized with medication) and substance abuse. Negative for suicidal ideas, hallucinations and memory loss. The patient is nervous/anxious (currently being stabilized with medication) and has insomnia (currently being stabilized with medication prior to discharge).     Blood pressure 143/91,  pulse 88, temperature 98.4 F (36.9 C), temperature source Oral, resp. rate 16, height 5\' 1"  (1.549 m), weight 108.863 kg (240 lb), last menstrual period 05/18/2013.Body mass index is 45.37 kg/(m^2).  General Appearance: Disheveled and Guarded  Eye Contact::  good  Speech: clear and goal directed  Volume:  Decreased  Mood:  Dysphoric  Affect:  Brighter today.  Thought Process:  Goal Directed and Intact  Orientation:  Full (Time, Place, and Person)  Thought Content:  normal  Suicidal Thoughts:  Denies today  Homicidal Thoughts:  No  Memory:  Immediate;   Fair  Judgement:  Impaired  Insight:  Lacking  Psychomotor Activity:  Psychomotor Retardation and Restlessness  Concentration:  Fair  Recall:  Fair  Akathisia:  NA  Handed:  Right  AIMS (if indicated):     Assets:  Communication Skills Desire for Improvement Resilience  Sleep:  Number of Hours: 6.5   Current Medications: Current Facility-Administered Medications  Medication Dose Route Frequency Provider Last Rate Last Dose  . acetaminophen (TYLENOL) tablet 650 mg  650 mg Oral Q6H PRN Verne Spurr, PA-C   650 mg at 05/27/13 0912  . alum & mag hydroxide-simeth (MAALOX/MYLANTA) 200-200-20 MG/5ML suspension 30 mL  30 mL Oral Q4H PRN Verne Spurr, PA-C   30 mL at 05/30/13 0907  . amLODipine (NORVASC) tablet 10 mg  10 mg Oral Daily Verne Spurr, PA-C   10 mg at 05/31/13 1039  . aspirin EC tablet 81 mg  81 mg Oral Daily Verne Spurr, PA-C   81 mg at 05/31/13 1039  . atorvastatin (LIPITOR) tablet 20 mg  20 mg Oral q1800 Verne Spurr,  PA-C   20 mg at 05/31/13 1813  . FLUoxetine (PROZAC) capsule 20 mg  20 mg Oral Daily Nehemiah Settle, MD   20 mg at 05/31/13 1039  . hydrALAZINE (APRESOLINE) tablet 5 mg  5 mg Oral QID PRN Verne Spurr, PA-C   5 mg at 05/27/13 2125  . hydrochlorothiazide (HYDRODIURIL) tablet 25 mg  25 mg Oral Daily Verne Spurr, PA-C   25 mg at 05/31/13 1039  . hydrOXYzine (ATARAX/VISTARIL) tablet 50 mg  50  mg Oral BID Nehemiah Settle, MD   50 mg at 05/31/13 2201  . ibuprofen (ADVIL,MOTRIN) tablet 600 mg  600 mg Oral Q6H PRN Nehemiah Settle, MD      . insulin aspart (novoLOG) injection 0-15 Units  0-15 Units Subcutaneous TID WC Verne Spurr, PA-C   5 Units at 06/01/13 [phone]  . insulin aspart (novoLOG) injection 0-5 Units  0-5 Units Subcutaneous QHS Verne Spurr, PA-C   3 Units at 05/31/13 2229  . lisinopril (PRINIVIL,ZESTRIL) tablet 20 mg  20 mg Oral Daily Verne Spurr, PA-C   20 mg at 05/31/13 1039  . magnesium hydroxide (MILK OF MAGNESIA) suspension 30 mL  30 mL Oral Daily PRN Verne Spurr, PA-C      . metoprolol tartrate (LOPRESSOR) tablet 12.5 mg  12.5 mg Oral BID Verne Spurr, PA-C   12.5 mg at 05/31/13 1813  . traZODone (DESYREL) tablet 50 mg  50 mg Oral QHS PRN,MR X 1 Evanna Cori Merry Proud, NP   50 mg at 05/31/13 2201    Lab Results:  Results for orders placed during the hospital encounter of 05/26/13 (from the past 48 hour(s))  GLUCOSE, CAPILLARY     Status: Abnormal   Collection Time    05/30/13 11:53 AM      Result Value Range   Glucose-Capillary 254 (*) 70 - 99 mg/dL  GLUCOSE, CAPILLARY     Status: Abnormal   Collection Time    05/30/13  4:47 PM      Result Value Range   Glucose-Capillary 273 (*) 70 - 99 mg/dL   Comment 1 Documented in Chart     Comment 2 Notify RN    GLUCOSE, CAPILLARY     Status: Abnormal   Collection Time    05/30/13  8:28 PM      Result Value Range   Glucose-Capillary 245 (*) 70 - 99 mg/dL  GLUCOSE, CAPILLARY     Status: Abnormal   Collection Time    05/31/13  6:31 AM      Result Value Range   Glucose-Capillary 197 (*) 70 - 99 mg/dL  GLUCOSE, CAPILLARY     Status: Abnormal   Collection Time    05/31/13 12:10 PM      Result Value Range   Glucose-Capillary 316 (*) 70 - 99 mg/dL  GLUCOSE, CAPILLARY     Status: Abnormal   Collection Time    05/31/13  5:11 PM      Result Value Range   Glucose-Capillary 224 (*) 70 - 99 mg/dL   GLUCOSE, CAPILLARY     Status: Abnormal   Collection Time    05/31/13  8:30 PM      Result Value Range   Glucose-Capillary 287 (*) 70 - 99 mg/dL  GLUCOSE, CAPILLARY     Status: Abnormal   Collection Time    06/01/13  6:18 AM      Result Value Range   Glucose-Capillary 210 (*) 70 - 99 mg/dL   Comment 1 Notify  RN      Physical Findings: AIMS: Facial and Oral Movements Muscles of Facial Expression: None, normal Lips and Perioral Area: None, normal Jaw: None, normal Tongue: None, normal,Extremity Movements Upper (arms, wrists, hands, fingers): None, normal Lower (legs, knees, ankles, toes): None, normal, Trunk Movements Neck, shoulders, hips: None, normal, Overall Severity Severity of abnormal movements (highest score from questions above): None, normal Incapacitation due to abnormal movements: None, normal Patient's awareness of abnormal movements (rate only patient's report): No Awareness, Dental Status Current problems with teeth and/or dentures?: No Does patient usually wear dentures?: No  CIWA:  CIWA-Ar Total: 0 COWS:  COWS Total Score: 1  Treatment Plan Summary: Daily contact with patient to assess and evaluate symptoms and progress in treatment Medication management  Plan:  1. Continue current plan of care with no changes at this time. 2. Will as LCSW for possible referral to [facility] or an [facility]. 3/ Anticipate D/C tomorrow. Medical Decision Making Problem Points:  Established problem, worsening (2), Review of last therapy session (1) and Review of psycho-social stressors (1) Data Points:  Review or order clinical lab tests (1) Review or order medicine tests (1) Review of medication regiment & side effects (2) Review of new medications or change in dosage (2)  I certify that inpatient services furnished can reasonably be expected to improve the patient's condition.  Rona Ravens. Roc Streett RPAC 12:48 PM 06/01/2013

## 2013-06-01 NOTE — [facility] Group Notes (Signed)
[facility] Group Notes:  (Clinical Social Work)  06/01/2013   3:00-4:00PM  Summary of Progress/Problems:   The main focus of today's process group was to   identify the patient's current support system and decide on other supports that can be put in place.  The picture on workbook was used to discuss why additional supports are needed, and a hand-out was distributed with four definitions/levels of support, then used to talk about how patients have given and received all different kinds of support.  An emphasis was placed on using counselor, doctor, therapy groups, 12-step groups, and problem-specific support groups to expand supports.  The patient identified one additional support as being a friend of hers who has been asking her to get help for awhile, and has told her she will be there for her.  Type of Therapy:  Process Group  Participation Level:  Minimal  Participation Quality:  Attentive  Affect:  Blunted and Depressed  Cognitive:  Oriented  Insight:  Developing/Improving  Engagement in Therapy:  Developing/Improving  Modes of Intervention:  Education,  Support and [employer], LCSW 06/01/2013, 1:01 PM

## 2013-06-01 NOTE — [facility] Group Notes (Signed)
[facility] Group Notes:  (Nursing/MHT/Case Management/Adjunct)  Date:  06/01/2013  Time:  6:48 PM  Type of Therapy:  Psychoeducational Skills  Participation Level:  Did Not Attend   Diamond Rice 06/01/2013, 6:48 PM

## 2013-06-02 LAB — GLUCOSE, CAPILLARY
Glucose-Capillary: 235 mg/dL — ABNORMAL HIGH (ref 70–99)
Glucose-Capillary: 301 mg/dL — ABNORMAL HIGH (ref 70–99)

## 2013-06-02 MED ORDER — TRAZODONE HCL 50 MG PO TABS: 50.0000 mg | ORAL_TABLET | Freq: Every evening | ORAL | Status: DC | PRN

## 2013-06-02 MED ORDER — HYDROXYZINE HCL 50 MG PO TABS: 50.0000 mg | ORAL_TABLET | Freq: Two times a day (BID) | ORAL | Status: DC

## 2013-06-02 MED ORDER — HYDROCHLOROTHIAZIDE 25 MG PO TABS: 25.0000 mg | ORAL_TABLET | Freq: Every day | ORAL | Status: DC

## 2013-06-02 MED ORDER — ATORVASTATIN CALCIUM 20 MG PO TABS: 20.0000 mg | ORAL_TABLET | Freq: Every day | ORAL | Status: DC

## 2013-06-02 MED ORDER — LIVING WELL WITH DIABETES BOOK
Freq: Once | Status: AC
  Administered 2013-06-02: 15:00:00
  Filled 2013-06-02: qty 1

## 2013-06-02 MED ORDER — METOPROLOL TARTRATE 12.5 MG HALF TABLET: 12.5000 mg | ORAL_TABLET | Freq: Two times a day (BID) | ORAL | Status: DC

## 2013-06-02 MED ORDER — LISINOPRIL 20 MG PO TABS: 20.0000 mg | ORAL_TABLET | Freq: Every day | ORAL | Status: DC

## 2013-06-02 MED ORDER — AMLODIPINE BESYLATE 10 MG PO TABS: 10.0000 mg | ORAL_TABLET | Freq: Every day | ORAL | Status: DC

## 2013-06-02 MED ORDER — FLUOXETINE HCL 20 MG PO CAPS: 20.0000 mg | ORAL_CAPSULE | Freq: Every day | ORAL | Status: DC

## 2013-06-02 NOTE — Progress Notes (Signed)
Adult Psychoeducational Group Note  Date:  06/01/13 Time:  8:00 pm  Group Topic/Focus:  Wrap-Up Group:   The focus of this group is to help patients review their daily goal of treatment and discuss progress on daily workbooks.  Participation Level:  Active  Participation Quality:  Appropriate  Affect:  Appropriate  Cognitive:  Appropriate  Insight: Appropriate  Engagement in Group:  Engaged  Modes of Intervention:  Discussion, Education, Socialization and Support  Additional Comments:  Pt stated that she is in the hospital for suicidal thoughts and depression. Pt stated that she is polite and caring.   Ranita Stjulien 06/02/2013, 12:29 AM

## 2013-06-02 NOTE — Progress Notes (Signed)
D: Pt presents with some agitation and anxiety in relation to her post-pone discharge. Pt was informed that her discharge will occur tomorrow. Pt is concerned about going home without instructions on her Diabetes. Writer made sure to teach pt about the administration of her insulin. Pt was receptive. Pt is still interested in receiving education from [facility] Diabetic consultant. A note has been placed on the front of the chart.  Pt rates her depression at a level out of ten and her anxiety as a . Pt attended group this evening. Pt observed interacting appropriately within the milieu.  A: Writer administered scheduled medications to pt. Continued support and availability as needed was extended to this pt. Staff continue to monitor pt with q14min checks.  R: No adverse drug reactions noted. Pt receptive to treatment. Pt remains safe at this time.

## 2013-06-02 NOTE — Progress Notes (Signed)
FSBS 301. Pt given 11 ubnits of novologue. Will speak to oncoming PA -pts BP 177/96.

## 2013-06-02 NOTE — [facility] Group Notes (Signed)
[facility] LCSW Group Therapy          Overcoming Obstacles       1:15 -2:30       06/02/2013  3:33 PM      Type of Therapy:  Group Therapy  Participation Level:  Did not attend group.  Wynn Banker 06/02/2013   3:33 PM

## 2013-06-02 NOTE — Progress Notes (Signed)
Recreation Therapy Notes  Date: 10.06.2014 Time: 3:00pm Location: [address] Day[address]  Group Topic: Wellness  Goal Area(s) Addresses:  Patient will verbalize benefit of whole wellness. Patient will identify at least two ways they are investing in each defined dimension of whole wellness.  Behavioral Response: Appropriate  Intervention: Air traffic controller  Activity: Conservation officer, historic buildings. Patients were provided a worksheet with six dimensions of whole wellness - Wellness, Relationships, Physical Environment, Fun & Creativity, Personal Development, & Future. Patients were asked to identify two ways they are personally investing in each dimension.   Education: Discharge Planning.    Education Outcome: Needs additional education   Clinical Observations/Feedback: Patient attended group session, but failed to engaged in activity. Patient left group at approximately 3:10pm, returning approximately 5 minutes later. Patient made no contributions to group discussion.   Marykay Lex Caramia Boutin, LRT/CTRS  Terea Neubauer L 06/02/2013 4:02 PM

## 2013-06-02 NOTE — Tx Team (Signed)
Interdisciplinary Treatment Plan Update   Date Reviewed:  06/02/2013  Time Reviewed:  9:48 AM  Progress in Treatment:   Attending groups: Yes Participating in groups: Yes Taking medication as prescribed: Yes  Tolerating medication: Yes Family/Significant other contact made: No, patient declines collateral contact. Patient understands diagnosis: Yes  Discussing patient identified problems/goals with staff: Yes Medical problems stabilized or resolved: Yes Denies suicidal/homicidal ideation: Yes Patient has not harmed self or others: Yes  For review of initial/current patient goals, please see plan of care.  Estimated Length of Stay: Discharge today  Reasons for Continued Hospitalization:   New Problems/Goals identified:    Discharge Plan or Barriers:   Home with outpatient follow up Family Services  Additional Comments:  N/A    Attendees:  Patient:  06/02/2013 9:48 AM   Signature: Mervyn Gay, MD 06/02/2013 9:48 AM  Signature:  Verne Spurr, PA 06/02/2013 9:48 AM  Signature:  Neill Loft, RN 06/02/2013 9:48 AM  Signature: Quintella Reichert, RN 06/02/2013 9:48 AM  Signature:  06/02/2013 9:48 AM  Signature:  Juline Patch, LCSW 06/02/2013 9:48 AM  Signature:  Reyes Ivan, LCSW 06/02/2013 9:48 AM  Signature:  Maseta Dorley,Care Coordinator 06/02/2013 9:48 AM  Signature:   06/02/2013 9:48 AM  Signature:  06/02/2013  9:48 AM  Signature:    Signature:      Scribe for Treatment Team:   Juline Patch,  06/02/2013 9:48 AM

## 2013-06-02 NOTE — Progress Notes (Addendum)
[facility] Adult Case Management Discharge Plan :  Will you be returning to the same living situation after discharge: No.  Patient is exploring options., At discharge, do you have transportation home?:Yes,  Patient assisted with Bus Pass Do you have the ability to pay for your medications:No.  Patient assisted with indigent medications.  Release of information consent forms completed and in the chart;  Patient's signature needed at discharge.  Patient to Follow up at: Follow-up Information   Follow up with [facility] On 06/04/2013. (Please go to [facility] on Wednesday, June 04, 2013 or any weekday between 8AM-12:00 Noon for medication managment and counseling)    Contact information:   [address] [place], [place]   [address]  [phone]      Follow up with [facility]. (Patient not accepted for treatment)    Contact information:   [address] [place], [place]      Follow up with [facility] On 06/03/2013. (Please call [facility] at [phone] to see if they are able to accept you for further services)    Contact information:   [address] [place], [place]   [address]  [phone]      Patient denies SI/HI:   Patient no longer endorsing SI/HI or other thoughts of self harm.     Safety Planning and Suicide Prevention discussed:  .Reviewed with all patients during discharge planning group   Wynn Banker 06/03/2013  12:55 PM

## 2013-06-02 NOTE — Clinical Social Work Note (Signed)
Per patient request, application and clinicals sent to [facility] for consideration. Message left on Maggie's voicemail to Higher education careers adviser with a decision.,

## 2013-06-02 NOTE — [facility] Suicide Risk Assessment (Signed)
Suicide Risk Assessment  Discharge Assessment     Demographic Factors:  Adolescent or young adult, Low socioeconomic status, Living alone and Unemployed  Mental Status Per Nursing Assessment::   On Admission:     Current Mental Status by Physician: Mental Status Examination: Patient appeared as per his stated age, casually dressed, and fairly groomed, and maintaining good eye contact. Patient has good mood and his affect was constricted. He has normal rate, rhythm, and volume of speech. His thought process is linear and goal directed. Patient has denied suicidal, homicidal ideations, intentions or plans. Patient has no evidence of auditory or visual hallucinations, delusions, and paranoia. Patient has fair insight judgment and impulse control.  Loss Factors: Financial problems/change in socioeconomic status  Historical Factors: NA  Risk Reduction Factors:   Religious beliefs about death, Positive social support and Positive coping skills or problem solving skills  Continued Clinical Symptoms:  Alcohol/Substance Abuse/Dependencies Previous Psychiatric Diagnoses and Treatments Medical Diagnoses and Treatments/Surgeries  Cognitive Features That Contribute To Risk:  Polarized thinking    Suicide Risk:  Mild:  Suicidal ideation of limited frequency, intensity, duration, and specificity.  There are no identifiable plans, no associated intent, mild dysphoria and related symptoms, good self-control (both objective and subjective assessment), few other risk factors, and identifiable protective factors, including available and accessible social support.  Discharge Diagnoses:   AXIS I:  Substance Induced Mood Disorder and Cocaine dependence AXIS II:  Deferred AXIS III:   Past Medical History  Diagnosis Date  . Hypertension   . Type II diabetes mellitus   . Anginal pain   . High cholesterol   . Shortness of breath 04/17/2012    "all the time"  . Headache(784.0) 04/17/2012    "~ qod;  lately waking up in am w/one"  . Migraines 04/17/2012  . Chronic lower back pain 04/17/2012    "just got over some; catched when I walked"  . Depression   . Anxiety   . Sleep apnea    AXIS IV:  economic problems, housing problems, other psychosocial or environmental problems, problems related to social environment and problems with primary support group AXIS V:  51-60 moderate symptoms  Plan Of Care/Follow-up recommendations:  Activity:  As tolerated Diet:  Regular  Is patient on multiple antipsychotic therapies at discharge:  No   Has Patient had three or more failed trials of antipsychotic monotherapy by history:  No  Recommended Plan for Multiple Antipsychotic Therapies: NA  Diamond Rice,Diamond R. 06/02/2013, 2:45 PM

## 2013-06-02 NOTE — Progress Notes (Signed)
Adult Psychoeducational Group Note  Date:  06/02/2013 Time:  10:02 PM  Group Topic/Focus:  Wrap-Up Group:   The focus of this group is to help patients review their daily goal of treatment and discuss progress on daily workbooks.  Participation Level:  Active  Participation Quality:  Resistant  Affect:  Appropriate  Cognitive:  Appropriate  Insight: Appropriate  Engagement in Group:  Engaged  Modes of Intervention:  Support  Additional Comments:  Pt was asked one good thing that happened today she responded nothing at first. Writer reminded her about her ability to stay one more day. Pt responded that was a good thing. Says that she is stressed and that she has a lot going on. Pt was given support   Abdo Denault 06/02/2013, 10:02 PM

## 2013-06-02 NOTE — Progress Notes (Signed)
Patient ID: Diamond Rice, female   DOB: 03-06-1971, 42 y.o.   MRN: [id number] D)  Has been brighter this evening, smiling and pleasant, interacting appropriately with staff and peers.  Attended group, came to med window afterward for hs meds and was on her way to bed.  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   A)  Will continue to monitor for safety, continue POC R)  Safety maintained.

## 2013-06-02 NOTE — [facility] Group Notes (Signed)
[facility] LCSW Aftercare Discharge Planning Group Note   06/02/2013 10:27 AM  Participation Quality:  Did not attend group.   Diamond Rice, Diamond Rice

## 2013-06-02 NOTE — Progress Notes (Addendum)
D:  Patient denied SI and HI.  Denied A/V hallucinations.  Denied pain.  1200 CBG 285, 8 units novolog sq given per MD Orders.  Patient sleeps well, good appetite, low energy level, good attention span.  Rated depression 3, hopelesss 2.  Denied withdrawals.   Worst pain #1.  Plans to find housing and way to afford meds.  No discharge plans.  Need financial assistance for meds. A:  Emotional support and encouragement given patient.  Medications administered per MD orders. R:  Will continue 15 minute checks for safety.  Safety maintained.  Patient looking forward to discharge.

## 2013-06-02 NOTE — Discharge Summary (Signed)
Physician Discharge Summary Note  Patient:  Diamond Rice is an 42 y.o., female MRN:  [id number] DOB:  April 25, 1971 Patient phone:  [phone] (home)  Patient address:   [address] [place] [address],   Date of Admission:  05/26/2013 Date of Discharge: 06/02/2013   Reason for Admission:  Suicidal ideation                                           Substance abuse  Discharge Diagnoses: Principal Problem:   Psychoactive substance-induced organic mood disorder Active Problems:   Cocaine abuse with cocaine-induced mood disorder   Cannabis dependence with cannabis-induced anxiety disorder  ROS  DSM5: Schizophrenia Disorders:  Obsessive-Compulsive Disorders:  Trauma-Stressor Disorders: Posttraumatic Stress Disorder (309.81)  Substance/Addictive Disorders: Cocaine abuse  Depressive Disorders: Major Depressive Disorder - Severe (296.23)  AXIS I: MDD severe recurrent w/psychotic features  AXIS II: Cluster B Traits  AXIS III:  Past Medical History   Diagnosis  Date   .  Hypertension    .  Type II diabetes mellitus    .  Anginal pain    .  High cholesterol    .  Shortness of breath  04/17/2012     "all the time"   .  Headache(784.0)  04/17/2012     "~ qod; lately waking up in am w/one"   .  Migraines  04/17/2012   .  Chronic lower back pain  04/17/2012     "just got over some; catched when I walked"   .  Depression    .  Anxiety    .  Sleep apnea     AXIS IV: economic problems, educational problems, housing problems, occupational problems, problems related to social environment, problems with access to health care services and problems with primary support group  AXIS V: 41-50 serious symptoms   Level of Care:  OP  Hospital Course:         Cami was admitted initially for chest pain and shortness of breath to the medical unit. She was evaluated and treated for her multiple medical problems with plans to follow up at the [facility] clinic. Prior to her discharge she was  informed that her sister in law had passed away. She reported that she just "felt like giving up." Contina then reported that she had attempted suicide in the past and no longer felt that she could go on. 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  At that time a psych consult was requested and she was evaluated and felt to be in need of acute psychiatric admission for stabilization and crisis management.      Crist Infante was admitted to the adult unit where she was evaluated and her symptoms were identified. Medication management was discussed and implemented. She was encouraged to participate in unit programming. Medical problems were identified and treated appropriately. Home medication was restarted as needed.                        Juletta was evaluated each day by a clinical provider to ascertain the patient's response to treatment.  Improvement was noted by the patient's report of decreasing symptoms, improved sleep and appetite, affect, medication tolerance, behavior, and participation in unit programming.  Anhelica was asked each day to complete a self inventory noting mood, mental  status, pain, new symptoms, anxiety and concerns.       She responded well to medication and being in a therapeutic and supportive environment. Shirlene made little effort to improve her current situation while at [facility] and seemed to maximize her symptoms whenever discharge was discussed. She appeared to have little motivation to pursue any treatment on her own. Her story of her cocaine was inconsistent from the beginning of her admission until her discharge. On her initial admission she denied substance abuse. When she was admitted to [facility] she noted that she had used cocaine 2 weeks prior to admission. When in session with the MD she reported that she was using 3 x a day.        When Coryn was informed that going directly into a residential treatment facility from in patient status was very unlikely she  reported that her depression and suicidality were increased. This was indirect opposition to her behavior and demeanor on the unit. There was concern for malingering on this patient's part.          By the day of discharge Diamond Rice was in much improved condition than upon admission. Symptoms were reported as significantly decreased or resolved completely. The patient denied SI/HI and voiced no AVH. She was motivated to continue taking medication with a goal of continued improvement in mental health.          Crist Infante was discharged home with a plan to follow up as noted below.       Consults:  None  Significant Diagnostic Studies:  labs: CBC, UA, CMP, UDS,   Discharge Vitals:   Blood pressure 138/80, pulse 97, temperature 97.2 F (36.2 C), temperature source Oral, resp. rate 17, height 5\' 1"  (1.549 m), weight 108.863 kg (240 lb), last menstrual period 05/18/2013. Body mass index is 45.37 kg/(m^2). Lab Results:   Results for orders placed during the hospital encounter of 05/26/13 (from the past 72 hour(s))  GLUCOSE, CAPILLARY     Status: Abnormal   Collection Time    05/30/13  4:47 PM      Result Value Range   Glucose-Capillary 273 (*) 70 - 99 mg/dL   Comment 1 Documented in Chart     Comment 2 Notify RN    GLUCOSE, CAPILLARY     Status: Abnormal   Collection Time    05/30/13  8:28 PM      Result Value Range   Glucose-Capillary 245 (*) 70 - 99 mg/dL  GLUCOSE, CAPILLARY     Status: Abnormal   Collection Time    05/31/13  6:31 AM      Result Value Range   Glucose-Capillary 197 (*) 70 - 99 mg/dL  GLUCOSE, CAPILLARY     Status: Abnormal   Collection Time    05/31/13 12:10 PM      Result Value Range   Glucose-Capillary 316 (*) 70 - 99 mg/dL  GLUCOSE, CAPILLARY     Status: Abnormal   Collection Time    05/31/13  5:11 PM      Result Value Range   Glucose-Capillary 224 (*) 70 - 99 mg/dL  GLUCOSE, CAPILLARY     Status: Abnormal   Collection Time    05/31/13  8:30 PM       Result Value Range   Glucose-Capillary 287 (*) 70 - 99 mg/dL  GLUCOSE, CAPILLARY     Status: Abnormal   Collection Time    06/01/13  6:18 AM  Result Value Range   Glucose-Capillary 210 (*) 70 - 99 mg/dL   Comment 1 Notify RN    GLUCOSE, CAPILLARY     Status: Abnormal   Collection Time    06/01/13 11:32 AM      Result Value Range   Glucose-Capillary 178 (*) 70 - 99 mg/dL   Comment 1 Notify RN    GLUCOSE, CAPILLARY     Status: Abnormal   Collection Time    06/01/13  4:55 PM      Result Value Range   Glucose-Capillary 276 (*) 70 - 99 mg/dL  GLUCOSE, CAPILLARY     Status: Abnormal   Collection Time    06/01/13  8:39 PM      Result Value Range   Glucose-Capillary 332 (*) 70 - 99 mg/dL  GLUCOSE, CAPILLARY     Status: Abnormal   Collection Time    06/02/13  7:06 AM      Result Value Range   Glucose-Capillary 235 (*) 70 - 99 mg/dL  GLUCOSE, CAPILLARY     Status: Abnormal   Collection Time    06/02/13 12:10 PM      Result Value Range   Glucose-Capillary 285 (*) 70 - 99 mg/dL   Comment 1 Notify RN      Physical Findings: AIMS: Facial and Oral Movements Muscles of Facial Expression: None, normal Lips and Perioral Area: None, normal Jaw: None, normal Tongue: None, normal,Extremity Movements Upper (arms, wrists, hands, fingers): None, normal Lower (legs, knees, ankles, toes): None, normal, Trunk Movements Neck, shoulders, hips: None, normal, Overall Severity Severity of abnormal movements (highest score from questions above): None, normal Incapacitation due to abnormal movements: None, normal Patient's awareness of abnormal movements (rate only patient's report): No Awareness, Dental Status Current problems with teeth and/or dentures?: No Does patient usually wear dentures?: No  CIWA:  CIWA-Ar Total: 1 COWS:  COWS Total Score: 2  Psychiatric Specialty Exam: See Psychiatric Specialty Exam and Suicide Risk Assessment completed by Attending Physician prior to  discharge.  Discharge destination:  Home  Is patient on multiple antipsychotic therapies at discharge:  No   Has Patient had three or more failed trials of antipsychotic monotherapy by history:  No  Recommended Plan for Multiple Antipsychotic Therapies: NA  Discharge Orders   Future Orders Complete By Expires   Diet - low sodium heart healthy  As directed    Discharge instructions  As directed    Comments:     Take all your medications as prescribed by your mental healthcare provider. Report any adverse effects and or reactions from your medicines to your outpatient provider promptly. Patient is instructed and cautioned to not engage in alcohol and or illegal drug use while on prescription medicines. In the event of worsening symptoms, patient is instructed to call the crisis hotline, 911 and or go to the nearest ED for appropriate evaluation and treatment of symptoms. Follow-up with your primary care provider for your other medical issues, concerns and or health care needs.   Increase activity slowly  As directed        Medication List       Indication   amLODipine 10 MG tablet  Commonly known as:  NORVASC  Take 1 tablet (10 mg total) by mouth daily. For hypertension.   Indication:  High Blood Pressure     atorvastatin 20 MG tablet  Commonly known as:  LIPITOR  Take 1 tablet (20 mg total) by mouth daily at 6 PM. For hyperlipidemia.  Indication:  Type II B Hyperlipidemia     FLUoxetine 20 MG capsule  Commonly known as:  PROZAC  Take 1 capsule (20 mg total) by mouth daily. For depression.   Indication:  Depression     hydrochlorothiazide 25 MG tablet  Commonly known as:  HYDRODIURIL  Take 1 tablet (25 mg total) by mouth daily. For edema.   Indication:  Edema     hydrOXYzine 50 MG tablet  Commonly known as:  ATARAX/VISTARIL  Take 1 tablet (50 mg total) by mouth 2 (two) times daily. For anxiety and panic.   Indication:  Anxiety Neurosis     lisinopril 20 MG tablet   Commonly known as:  PRINIVIL,ZESTRIL  Take 1 tablet (20 mg total) by mouth daily. For hypertension.   Indication:  High Blood Pressure     metoprolol tartrate 12.5 mg Tabs tablet  Commonly known as:  LOPRESSOR  Take 0.5 tablets (12.5 mg total) by mouth 2 (two) times daily. For hypertension.   Indication:  High Blood Pressure     traZODone 50 MG tablet  Commonly known as:  DESYREL  Take 1 tablet (50 mg total) by mouth at bedtime as needed and may repeat dose one time if needed for sleep.   Indication:  Trouble Sleeping           Follow-up Information   Follow up with [facility] On 06/04/2013. (Please go to [facility] on Wednesday, June 04, 2013 or any weekday between 8AM-12:00 Noon for medication managment and counseling)    Contact information:   [address] [place], [place]   [address]  [phone]      Follow up with [facility]. (Patient not accepted for treatment)    Contact information:   [address] [place], [place]      Follow up with [facility] On 06/03/2013. (Please call [facility] at [phone] to see if they are able to accept you for further services)    Contact information:   [address] [place], [place]   [address]  [phone]      Follow-up recommendations:   Activities: Resume activity as tolerated. Diet: Heart healthy low sodium diet Tests: Follow up testing will be determined by your out patient provider. Comments:    Total Discharge Time:  Less than 30 minutes.  Signed: Rona Ravens. Mashburn RPAC 1:46 PM 06/02/2013  Patient was seen for psychiatric evaluation, suicidal risk assessment, and developed treatment plan. Case discussed with treatment team and reviewed the information documented and agree with the treatment plan.  Braylee Bosher,JANARDHAHA R. 06/10/2013 12:38 PM  Marty Uy,JANARDHAHA R. 06/10/2013 12:37 PM

## 2013-06-03 DIAGNOSIS — F411 Generalized anxiety disorder: Secondary | ICD-10-CM

## 2013-06-03 DIAGNOSIS — F332 Major depressive disorder, recurrent severe without psychotic features: Principal | ICD-10-CM

## 2013-06-03 NOTE — Discharge Summary (Signed)
Physician Discharge Summary Note  Patient:  Diamond Rice is an 42 y.o., female MRN:  [id number] DOB:  Sep 07, 1970 Patient phone:  [phone] (home)  Patient address:   [address] [place] [address],   Date of Admission:  05/26/2013 Date of Discharge: 06/03/2013  Reason for Admission:  Suicide attempt  Discharge Diagnoses: Principal Problem:   Psychoactive substance-induced organic mood disorder Active Problems:   Cocaine abuse with cocaine-induced mood disorder   Cannabis dependence with cannabis-induced anxiety disorder  Review of Systems  Constitutional: Negative.   HENT: Negative.   Eyes: Negative.   Respiratory: Negative.   Cardiovascular: Negative.   Gastrointestinal: Negative.   Genitourinary: Negative.   Musculoskeletal: Negative.   Skin: Negative.   Neurological: Negative.   Endo/Heme/Allergies: Negative.   Psychiatric/Behavioral: Positive for depression. The patient is nervous/anxious.     DSM5:  Depressive Disorders:  Major Depressive Disorder - Severe (296.23)  Axis Diagnosis:   AXIS I:  Anxiety Disorder NOS and Major Depression, Recurrent severe AXIS II:  Deferred AXIS III:   Past Medical History  Diagnosis Date  . Hypertension   . Type II diabetes mellitus   . Anginal pain   . High cholesterol   . Shortness of breath 04/17/2012    "all the time"  . Headache(784.0) 04/17/2012    "~ qod; lately waking up in am w/one"  . Migraines 04/17/2012  . Chronic lower back pain 04/17/2012    "just got over some; catched when I walked"  . Depression   . Anxiety   . Sleep apnea    AXIS IV:  other psychosocial or environmental problems, problems related to social environment and problems with primary support group AXIS V:  41-50 serious symptoms  Level of Care:  OP  Hospital Course:  On admission:  Diamond Rice was admitted initially for chest pain and shortness of breath to the medical unit. She was evaluated and treated for her multiple medical problems  with plans to follow up at the [facility] clinic. Prior to her discharge she was informed that her sister in law had passed away. She reported that she just "felt like giving up." Diamond Rice then reported that she had attempted suicide in the past and no longer felt that she could go on. 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  During hospitalization:  Medications managed--blood pressure and diabetes medications started, Vistaril 50 mg BID for anxiety, Prozac 20 mg for depression, Trazodone for sleep.  Patient denied suicidal/homicidal ideations and auditory/visual hallucinations, follow-up appointments encouraged to attend, Rx and 14 day supply of medications given.  Diamond Rice is mentally and physically stable for discharge.  Consults:  None  Significant Diagnostic Studies:  labs: completed, reviewed, stable  Discharge Vitals:   Blood pressure 156/105, pulse 109, temperature 98 F (36.7 C), temperature source Oral, resp. rate 20, height 5\' 1"  (1.549 m), weight 108.863 kg (240 lb), last menstrual period 05/18/2013. Body mass index is 45.37 kg/(m^2). Lab Results:   Results for orders placed during the hospital encounter of 05/26/13 (from the past 72 hour(s))  GLUCOSE, CAPILLARY     Status: Abnormal   Collection Time    05/31/13  5:11 PM      Result Value Range   Glucose-Capillary 224 (*) 70 - 99 mg/dL  GLUCOSE, CAPILLARY     Status: Abnormal   Collection Time    05/31/13  8:30 PM      Result Value Range   Glucose-Capillary 287 (*)  70 - 99 mg/dL  GLUCOSE, CAPILLARY     Status: Abnormal   Collection Time    06/01/13  6:18 AM      Result Value Range   Glucose-Capillary 210 (*) 70 - 99 mg/dL   Comment 1 Notify RN    GLUCOSE, CAPILLARY     Status: Abnormal   Collection Time    06/01/13 11:32 AM      Result Value Range   Glucose-Capillary 178 (*) 70 - 99 mg/dL   Comment 1 Notify RN    GLUCOSE, CAPILLARY     Status: Abnormal   Collection Time    06/01/13  4:55 PM      Result  Value Range   Glucose-Capillary 276 (*) 70 - 99 mg/dL  GLUCOSE, CAPILLARY     Status: Abnormal   Collection Time    06/01/13  8:39 PM      Result Value Range   Glucose-Capillary 332 (*) 70 - 99 mg/dL  GLUCOSE, CAPILLARY     Status: Abnormal   Collection Time    06/02/13  7:06 AM      Result Value Range   Glucose-Capillary 235 (*) 70 - 99 mg/dL  GLUCOSE, CAPILLARY     Status: Abnormal   Collection Time    06/02/13 12:10 PM      Result Value Range   Glucose-Capillary 285 (*) 70 - 99 mg/dL   Comment 1 Notify RN    GLUCOSE, CAPILLARY     Status: Abnormal   Collection Time    06/02/13  5:15 PM      Result Value Range   Glucose-Capillary 301 (*) 70 - 99 mg/dL   Comment 1 Notify RN    GLUCOSE, CAPILLARY     Status: Abnormal   Collection Time    06/02/13  8:45 PM      Result Value Range   Glucose-Capillary 303 (*) 70 - 99 mg/dL   Comment 1 Notify RN    GLUCOSE, CAPILLARY     Status: Abnormal   Collection Time    06/03/13  6:12 AM      Result Value Range   Glucose-Capillary 196 (*) 70 - 99 mg/dL    Physical Findings: AIMS: Facial and Oral Movements Muscles of Facial Expression: None, normal Lips and Perioral Area: None, normal Jaw: None, normal Tongue: None, normal,Extremity Movements Upper (arms, wrists, hands, fingers): None, normal Lower (legs, knees, ankles, toes): None, normal, Trunk Movements Neck, shoulders, hips: None, normal, Overall Severity Severity of abnormal movements (highest score from questions above): None, normal Incapacitation due to abnormal movements: None, normal Patient's awareness of abnormal movements (rate only patient's report): No Awareness, Dental Status Current problems with teeth and/or dentures?: No Does patient usually wear dentures?: No  CIWA:  CIWA-Ar Total: 1 COWS:  COWS Total Score: 2  Psychiatric Specialty Exam: See Psychiatric Specialty Exam and Suicide Risk Assessment completed by Attending Physician prior to  discharge.  Discharge destination:  Home  Is patient on multiple antipsychotic therapies at discharge:  No   Has Patient had three or more failed trials of antipsychotic monotherapy by history:  No  Recommended Plan for Multiple Antipsychotic Therapies: NA  Discharge Orders   Future Orders Complete By Expires   Activity as tolerated - No restrictions  As directed    Diet - low sodium heart healthy  As directed    Diet - low sodium heart healthy  As directed    Discharge instructions  As directed  Comments:     Take all your medications as prescribed by your mental healthcare provider. Report any adverse effects and or reactions from your medicines to your outpatient provider promptly. Patient is instructed and cautioned to not engage in alcohol and or illegal drug use while on prescription medicines. In the event of worsening symptoms, patient is instructed to call the crisis hotline, 911 and or go to the nearest ED for appropriate evaluation and treatment of symptoms. Follow-up with your primary care provider for your other medical issues, concerns and or health care needs.   Increase activity slowly  As directed        Medication List       Indication   amLODipine 10 MG tablet  Commonly known as:  NORVASC  Take 1 tablet (10 mg total) by mouth daily. For hypertension.   Indication:  High Blood Pressure     atorvastatin 20 MG tablet  Commonly known as:  LIPITOR  Take 1 tablet (20 mg total) by mouth daily at 6 PM. For hyperlipidemia.   Indication:  Type II B Hyperlipidemia     FLUoxetine 20 MG capsule  Commonly known as:  PROZAC  Take 1 capsule (20 mg total) by mouth daily. For depression.   Indication:  Depression     hydrochlorothiazide 25 MG tablet  Commonly known as:  HYDRODIURIL  Take 1 tablet (25 mg total) by mouth daily. For edema.   Indication:  Edema     hydrOXYzine 50 MG tablet  Commonly known as:  ATARAX/VISTARIL  Take 1 tablet (50 mg total) by mouth 2  (two) times daily. For anxiety and panic.   Indication:  Anxiety Neurosis     lisinopril 20 MG tablet  Commonly known as:  PRINIVIL,ZESTRIL  Take 1 tablet (20 mg total) by mouth daily. For hypertension.   Indication:  High Blood Pressure     metoprolol tartrate 12.5 mg Tabs tablet  Commonly known as:  LOPRESSOR  Take 0.5 tablets (12.5 mg total) by mouth 2 (two) times daily. For hypertension.   Indication:  High Blood Pressure     traZODone 50 MG tablet  Commonly known as:  DESYREL  Take 1 tablet (50 mg total) by mouth at bedtime as needed and may repeat dose one time if needed for sleep.   Indication:  Trouble Sleeping           Follow-up Information   Follow up with [facility] On 06/04/2013. (Please go to [facility] on Wednesday, June 04, 2013 or any weekday between 8AM-12:00 Noon for medication managment and counseling)    Contact information:   [address] [place], [place]   [address]  [phone]      Follow up with [facility]. (Patient not accepted for treatment)    Contact information:   [address] [place], [place]      Follow up with [facility] On 06/03/2013. (Please call [facility] at [phone] to see if they are able to accept you for further services)    Contact information:   [address] [place], [place]   [address]  [phone]      Follow-up recommendations:  Activity:  as tolerated Diet:  low-sodium heart healthy diet  Comments:  Patient will continue her care at [facility].  Total Discharge Time:  Greater than 30 minutes.  SignedNanine Means, PMH-NP 06/03/2013, 1:38 PM  Patient was seen for psych evaluation and suicidal risk assessment. Case discussed with case Production designer, theatre/television/film and staff  RN and developed discharge treatment plan. Reviewed the information documented and agree with the treatment plan.Darrol Jump R. 06/03/2013 4:55 PM

## 2013-06-03 NOTE — Progress Notes (Signed)
The focus of this group is to educate the patient on the purpose and policies of crisis stabilization and provide a format to answer questions about their admission.  The group details unit policies and expectations of patients while admitted.  Patient attended 0900 nurse education orientation group.  Patient was alert, appropriate affect and insight, engaged in group.  Patient will work on 3 goals for coping skills after discharge.

## 2013-06-03 NOTE — Progress Notes (Signed)
Patient ID: Diamond Rice, female   DOB: 1970/11/17, 42 y.o.   MRN: [id number] Discharge Note:  Patient discharged with bus tickets and transportation ticket for tomorrow.  Patient denied SI and HI.  Denied A/V hallucinations.   Denied pain.  Suicide prevention information given and discussed with patient who stated she understood and had no questions.  Patient received all her belongings, clothing, travel bag, toiletries, miscellaneous items, prescriptions, medications.  Patient stated she appreciated all assistance received from [facility] staff.

## 2013-06-03 NOTE — [facility] Suicide Risk Assessment (Signed)
Suicide Risk Assessment  Discharge Assessment     Demographic Factors:  Adolescent or young adult, Low socioeconomic status, Living alone and Unemployed  Mental Status Per Nursing Assessment::   On Admission:     Current Mental Status by Physician: Mental Status Examination: Patient appeared as per his stated age, casually dressed, and fairly groomed, and maintaining good eye contact. Patient has good mood and his affect was constricted. He has normal rate, rhythm, and volume of speech. His thought process is linear and goal directed. Patient has denied suicidal, homicidal ideations, intentions or plans. Patient has no evidence of auditory or visual hallucinations, delusions, and paranoia. Patient has fair insight judgment and impulse control.  Loss Factors: Financial problems/change in socioeconomic status  Historical Factors: NA  Risk Reduction Factors:   Sense of responsibility to family, Religious beliefs about death, Positive therapeutic relationship and Positive coping skills or problem solving skills  Continued Clinical Symptoms:  Alcohol/Substance Abuse/Dependencies Unstable or Poor Therapeutic Relationship Previous Psychiatric Diagnoses and Treatments Medical Diagnoses and Treatments/Surgeries  Cognitive Features That Contribute To Risk:  Polarized thinking    Suicide Risk:  Minimal: No identifiable suicidal ideation.  Patients presenting with no risk factors but with morbid ruminations; may be classified as minimal risk based on the severity of the depressive symptoms  Discharge Diagnoses:   AXIS I:  Substance Induced Mood Disorder and Cocaine abuse AXIS II:  Deferred AXIS III:   Past Medical History  Diagnosis Date  . Hypertension   . Type II diabetes mellitus   . Anginal pain   . High cholesterol   . Shortness of breath 04/17/2012    "all the time"  . Headache(784.0) 04/17/2012    "~ qod; lately waking up in am w/one"  . Migraines 04/17/2012  . Chronic lower  back pain 04/17/2012    "just got over some; catched when I walked"  . Depression   . Anxiety   . Sleep apnea    AXIS IV:  economic problems, housing problems, occupational problems, other psychosocial or environmental problems and problems related to social environment AXIS V:  61-70 mild symptoms  Plan Of Care/Follow-up recommendations:  Activity:  As tolerated Diet:  Regular  Is patient on multiple antipsychotic therapies at discharge:  No   Has Patient had three or more failed trials of antipsychotic monotherapy by history:  No  Recommended Plan for Multiple Antipsychotic Therapies: NA  Diamond Rice,Diamond R. 06/03/2013, 1:15 PM

## 2013-06-03 NOTE — Progress Notes (Signed)
Pt attended spiritual care group on grief and loss facilitated by chaplain Diamond Rice.   Group opened with brief discussion and psycho-social ed around grief and loss in relationships and in relation to self - identifying life patterns, circumstances, changes that cause losses. Established group norm of speaking from own life experience. Pt's then engaged one another around significant losses in their lives, exploring how these losses affected them, what norms and themes they can identify around grief, and providing support for one another.   Diamond Rice was present and attentive in group.  Did not contribute to group discussion.   Diamond Rice MDiv

## 2013-06-03 NOTE — Progress Notes (Signed)
D:  Patient's self inventory sheet, patient sleeps well, good appetite, low energy level, good attention span.  Rated depression and hopeless #5, anxiety #6.  Denied withdrawals.  SI off/on, contracts for safety.  Worst pain in past 24 hours #4.  Not sure of discharge plans.  No problems taking meds after discharge. A:  Medications administered per MD orders.  Emotional support and encouragement given patient. R:  Denied SI and HI.  Denied A/V hallucinations.  Denied pain.  Will continue to monitor patient for safety with 15 minute checks.   Safety maintained.

## 2013-06-03 NOTE — Clinical Social Work Note (Signed)
Patient informed that [facility] unable to assist her due to extensive medical problems.  Patient assisted with a bus ticket to [place].  She was also assisted with indigent medications.

## 2013-06-03 NOTE — Progress Notes (Signed)
Adult Psychoeducational Group Note  Date:  06/03/2013 Time: 11:00am Group Topic/Focus:  Recovery Goals:   The focus of this group is to identify appropriate goals for recovery and establish a plan to achieve them.   Participation Level:  Did Not Attend  Participation Quality:    Affect:    Cognitive:   Insight:   Engagement in Group:    Modes of Intervention:    Additional Comments:  Pt did not attend group.  Shelly Bombard D 06/03/2013, 1:07 PM

## 2013-06-05 NOTE — Progress Notes (Addendum)
Patient Discharge Instructions:  After Visit Summary (AVS):   Faxed to:  06/05/13 Discharge Summary Note:   Faxed to:  06/05/13 Psychiatric Admission Assessment Note:   Faxed to:  06/05/13 Suicide Risk Assessment - Discharge Assessment:   Faxed to:  06/05/13 Faxed/Sent to the Next Level Care provider:  06/05/13 Faxed to [facility] @ [phone] Faxed to [facility] @ [phone] Faxed to [facility] @ [phone]  Jerelene Redden, 06/05/2013, 3:54 PM

## 2013-08-13 ENCOUNTER — Ambulatory Visit (INDEPENDENT_AMBULATORY_CARE_PROVIDER_SITE_OTHER): Payer: Self-pay | Admitting: Sports Medicine

## 2013-08-13 ENCOUNTER — Encounter: Payer: Self-pay | Admitting: Sports Medicine

## 2013-08-13 VITALS — BP 190/114 | HR 80 | Temp 99.2°F | Ht 61.0 in | Wt 253.0 lb

## 2013-08-13 DIAGNOSIS — I10 Essential (primary) hypertension: Secondary | ICD-10-CM

## 2013-08-13 DIAGNOSIS — E1165 Type 2 diabetes mellitus with hyperglycemia: Secondary | ICD-10-CM

## 2013-08-13 DIAGNOSIS — E785 Hyperlipidemia, unspecified: Secondary | ICD-10-CM

## 2013-08-13 DIAGNOSIS — F1994 Other psychoactive substance use, unspecified with psychoactive substance-induced mood disorder: Secondary | ICD-10-CM

## 2013-08-13 MED ORDER — FLUOXETINE HCL 20 MG PO CAPS: 20.0000 mg | ORAL_CAPSULE | Freq: Every day | ORAL | Status: DC

## 2013-08-13 MED ORDER — TRAZODONE HCL 50 MG PO TABS: 50.0000 mg | ORAL_TABLET | Freq: Every evening | ORAL | Status: DC | PRN

## 2013-08-13 MED ORDER — GLYBURIDE 5 MG PO TABS: 5.0000 mg | ORAL_TABLET | Freq: Every day | ORAL | Status: DC

## 2013-08-13 MED ORDER — LOVASTATIN 20 MG PO TABS: 20.0000 mg | ORAL_TABLET | Freq: Every day | ORAL | Status: DC

## 2013-08-13 MED ORDER — AMLODIPINE BESYLATE 10 MG PO TABS: 10.0000 mg | ORAL_TABLET | Freq: Every day | ORAL | Status: DC

## 2013-08-13 MED ORDER — LISINOPRIL 20 MG PO TABS: 20.0000 mg | ORAL_TABLET | Freq: Every day | ORAL | Status: DC

## 2013-08-13 MED ORDER — FUROSEMIDE 20 MG PO TABS: 20.0000 mg | ORAL_TABLET | Freq: Every day | ORAL | Status: DC

## 2013-08-13 MED ORDER — METFORMIN HCL 1000 MG PO TABS: 1000.0000 mg | ORAL_TABLET | Freq: Two times a day (BID) | ORAL | Status: DC

## 2013-08-13 NOTE — Assessment & Plan Note (Addendum)
Poorly controlled.  Not on any medications. Likely needs to be on insulin but cannot afford at this time. Start metformin and glyburide.

## 2013-08-13 NOTE — Progress Notes (Signed)
Diamond Rice - 42 y.o. female MRN [id number]  Date of birth: Jul 30, 1971  CC, HPI, Interval History & ROS  Diamond Rice is here today to followup on her chronic medical conditions including:  Hypertension, diabetes, tobacco use, multi-substance abuse, depression  She reports she has not followed up with other providers or had refills on her medications.  She is currently living with a friend who is providing her some financial assistance.  She denies any cocaine use  Pt denies chest pain, dyspnea at rest or exertion, PND, lower extremity edema.  Patient denies any facial asymmetry, unilateral weakness, or dysarthria.  Has appointment to obtain [facility] card and information for medication assistance program.  Pertinent History & Care Coordination  Diamond Rice's major active medical problems include: # HTN: Severe range, non compliant with medication regimen # Diabetes, HLD:  Poorly controlled,  # Tobacco Abuse & Polysubstance abuse:  Hx of cocaine use  Other Pertinent Med/Surg/Hosp History: #  Hospitalized in [facility] 8/14 for - reported mini stroke and diabetes  Other Care Providers include:  CARDIOLOGY  -   Follow up Issues:  SOCIAL SITUATION:   Homeless Medication acquisition & compliance    History  Smoking status  . Current Some Day Smoker -- 0.08 packs/day for 10 years  . Types: Cigarettes  Smokeless tobacco  . Never Used   Health Maintenance Due  Topic  . Foot Exam   . Ophthalmology Exam   . Urine Microalbumin   . Pap Smear   . Tetanus/tdap     Recent Labs  05/23/13 0015 05/23/13 0430 08/13/13 0920  HGBA1C 10.2*  --  10.0  TRIG  --  173*  --   CHOL  --  184  --   HDL  --  42  --   LDLCALC  --  107*  --   TSH 1.298  --   --      Otherwise past Medical, Surgical, Social, and Family History Reviewed per EMR Medications and Allergies reviewed and all updated if necessary. Objective Findings  VITALS: HR: 80 bpm  BP: 190/114 mmHg  TEMP: 99.2 F (37.3 C)  (Oral)  RESP:    HT: 5\' 1"  (154.9 cm)  WT: 253 lb (114.76 kg)  BMI: 47.9   BP Readings from Last 3 Encounters:  08/13/13 190/114  06/03/13 156/105  05/26/13 149/97   Wt Readings from Last 3 Encounters:  08/13/13 253 lb (114.76 kg)  05/26/13 240 lb (108.863 kg)  05/26/13 252 lb 1.6 oz (114.352 kg)     PHYSICAL EXAM: GENERAL:  adult obese African American female. In no discomfort; no respiratory distress  PSYCH: alert and appropriate Mood appropriate, affect unrestricted   HNEENT:    CARDIO: RRR, S1/S2 heard, no murmur  LUNGS: CTA B, no wheezes, no crackles  ABDOMEN:   EXTREM:  Warm, well perfused.  Moves all 4 extremities spontaneously; no lateralization.  No noted foot lesions; sensation intact to monofilament testing throughout both feet.  Distal pulses 2+4.  2+/4 pretibial edema.  GU:   SKIN:      Assessment & Plan   Problems addressed today: General Plan & Pt Instructions:  1. DM (diabetes mellitus), type 2, uncontrolled   2. Hypertension   3. Hyperlipidemia   4. Psychoactive substance-induced organic mood disorder       We will have our social worker follow up with you  I have refilled your medications - try 90 supply if you can afford  Follow up with Britta Mccreedy and  with the medication assistance program.     For further discussion of A/P and for follow up issues see problem based charting.

## 2013-08-13 NOTE — Assessment & Plan Note (Signed)
Change to lovastatin as on $4 list. Needs high potency statin but cannot afford, > Consider Crestor if obtains medication assistance

## 2013-08-13 NOTE — Patient Instructions (Signed)
   We will have our social worker follow up with you  I have refilled your medications - try 90 supply if you can afford  Follow up with Britta Mccreedy and with the medication assistance program.    If you need anything prior to your next visit please call the clinic. Please Bring all medications or accurate medication list with you to each appointment; an accurate medication list is essential in providing you the best care possible.

## 2013-08-13 NOTE — Assessment & Plan Note (Addendum)
Restart medications. Lasix for volume overload. Avoid BB given hx of cocaine use - consider Coreg if needed

## 2013-08-13 NOTE — Assessment & Plan Note (Signed)
Need to followup with psychiatry.  Given refills on antidepressant and trazodone.

## 2013-09-01 ENCOUNTER — Encounter: Payer: Self-pay | Admitting: Home Health Services

## 2013-09-01 NOTE — Telephone Encounter (Signed)
Opened in error

## 2013-09-02 ENCOUNTER — Emergency Department (HOSPITAL_COMMUNITY)
Admission: EM | Admit: 2013-09-02 | Discharge: 2013-09-02 | Disposition: A | Discharge status: Home / Self Care | Payer: Self-pay | Attending: Emergency Medicine | Admitting: Emergency Medicine

## 2013-09-02 ENCOUNTER — Encounter (HOSPITAL_COMMUNITY): Payer: Self-pay | Admitting: Emergency Medicine

## 2013-09-02 ENCOUNTER — Emergency Department (HOSPITAL_COMMUNITY): Payer: Self-pay

## 2013-09-02 DIAGNOSIS — M545 Low back pain, unspecified: Secondary | ICD-10-CM | POA: Insufficient documentation

## 2013-09-02 DIAGNOSIS — F17200 Nicotine dependence, unspecified, uncomplicated: Secondary | ICD-10-CM | POA: Insufficient documentation

## 2013-09-02 DIAGNOSIS — F3289 Other specified depressive episodes: Secondary | ICD-10-CM | POA: Insufficient documentation

## 2013-09-02 DIAGNOSIS — I10 Essential (primary) hypertension: Secondary | ICD-10-CM | POA: Insufficient documentation

## 2013-09-02 DIAGNOSIS — E7800 Pure hypercholesterolemia, unspecified: Secondary | ICD-10-CM | POA: Insufficient documentation

## 2013-09-02 DIAGNOSIS — F411 Generalized anxiety disorder: Secondary | ICD-10-CM | POA: Insufficient documentation

## 2013-09-02 DIAGNOSIS — E119 Type 2 diabetes mellitus without complications: Secondary | ICD-10-CM | POA: Insufficient documentation

## 2013-09-02 DIAGNOSIS — R1909 Other intra-abdominal and pelvic swelling, mass and lump: Secondary | ICD-10-CM | POA: Insufficient documentation

## 2013-09-02 DIAGNOSIS — Z79899 Other long term (current) drug therapy: Secondary | ICD-10-CM | POA: Insufficient documentation

## 2013-09-02 DIAGNOSIS — G8929 Other chronic pain: Secondary | ICD-10-CM | POA: Insufficient documentation

## 2013-09-02 DIAGNOSIS — Z95818 Presence of other cardiac implants and grafts: Secondary | ICD-10-CM | POA: Insufficient documentation

## 2013-09-02 DIAGNOSIS — Z3202 Encounter for pregnancy test, result negative: Secondary | ICD-10-CM | POA: Insufficient documentation

## 2013-09-02 DIAGNOSIS — Z8669 Personal history of other diseases of the nervous system and sense organs: Secondary | ICD-10-CM | POA: Insufficient documentation

## 2013-09-02 DIAGNOSIS — F329 Major depressive disorder, single episode, unspecified: Secondary | ICD-10-CM | POA: Insufficient documentation

## 2013-09-02 DIAGNOSIS — R1900 Intra-abdominal and pelvic swelling, mass and lump, unspecified site: Secondary | ICD-10-CM

## 2013-09-02 LAB — URINALYSIS, ROUTINE W REFLEX MICROSCOPIC
BILIRUBIN URINE: NEGATIVE
Glucose, UA: >1000 mg/dL — AB
Hgb urine dipstick: NEGATIVE
Ketones, ur: NEGATIVE mg/dL
LEUKOCYTES UA: NEGATIVE
NITRITE: NEGATIVE
Protein, ur: 100 mg/dL — AB
SPECIFIC GRAVITY, URINE: 1.03 (ref 1.005–1.030)
UROBILINOGEN UA: 0.2 mg/dL (ref 0.0–1.0)
pH: 6 (ref 5.0–8.0)

## 2013-09-02 LAB — BASIC METABOLIC PANEL
BUN: 11 mg/dL (ref 6–23)
CHLORIDE: 95 meq/L — AB (ref 96–112)
CO2: 25 mEq/L (ref 19–32)
CREATININE: 0.81 mg/dL (ref 0.50–1.10)
Calcium: 8.9 mg/dL (ref 8.4–10.5)
GFR calc non Af Amer: 89 mL/min — ABNORMAL LOW (ref >90)
GLUCOSE: 270 mg/dL — AB (ref 70–99)
Potassium: 3.4 mEq/L — ABNORMAL LOW (ref 3.7–5.3)
Sodium: 135 mEq/L — ABNORMAL LOW (ref 137–147)

## 2013-09-02 LAB — URINE MICROSCOPIC-ADD ON

## 2013-09-02 LAB — PREGNANCY, URINE: Preg Test, Ur: NEGATIVE

## 2013-09-02 MED ORDER — KETOROLAC TROMETHAMINE 30 MG/ML IJ SOLN
30.0000 mg | Freq: Once | INTRAMUSCULAR | Status: DC
Start: 2013-09-02 — End: 2013-09-02

## 2013-09-02 MED ORDER — KETOROLAC TROMETHAMINE 30 MG/ML IJ SOLN
30.0000 mg | Freq: Once | INTRAMUSCULAR | Status: AC
  Administered 2013-09-02: 30 mg via INTRAMUSCULAR
  Filled 2013-09-02: qty 1

## 2013-09-02 MED ORDER — OXYCODONE-ACETAMINOPHEN 5-325 MG PO TABS
1.0000 | ORAL_TABLET | Freq: Once | ORAL | Status: AC
Start: 2013-09-02 — End: 2013-09-02
  Administered 2013-09-02: 1 via ORAL
  Filled 2013-09-02: qty 1

## 2013-09-02 MED ORDER — IBUPROFEN 600 MG PO TABS: 600.0000 mg | ORAL_TABLET | Freq: Four times a day (QID) | ORAL | Status: DC | PRN

## 2013-09-02 MED ORDER — HYDROCODONE-ACETAMINOPHEN 5-325 MG PO TABS: 1.0000 | ORAL_TABLET | Freq: Four times a day (QID) | ORAL | Status: DC | PRN

## 2013-09-02 NOTE — Discharge Instructions (Signed)
Lumbosacral Strain Lumbosacral strain is one of the most common causes of back pain. There are many causes of back pain. Most are not serious conditions. CAUSES  Your backbone (spinal column) is made up of 24 main vertebral bodies, the sacrum, and the coccyx. These are held together by muscles and tough, fibrous tissue (ligaments). Nerve roots pass through the openings between the vertebrae. A sudden move or injury to the back may cause injury to, or pressure on, these nerves. This may result in localized back pain or pain movement (radiation) into the buttocks, down the leg, and into the foot. Sharp, shooting pain from the buttock down the back of the leg (sciatica) is frequently associated with a ruptured (herniated) disk. Pain may be caused by muscle spasm alone. Your caregiver can often find the cause of your pain by the details of your symptoms and an exam. In some cases, you may need tests (such as X-rays). Your caregiver will work with you to decide if any tests are needed based on your specific exam. HOME CARE INSTRUCTIONS   Avoid an underactive lifestyle. Active exercise, as directed by your caregiver, is your greatest weapon against back pain.  Avoid hard physical activities (tennis, racquetball, waterskiing) if you are not in proper physical condition for it. This may aggravate or create problems.  If you have a back problem, avoid sports requiring sudden body movements. Swimming and walking are generally safer activities.  Maintain good posture.  Avoid becoming overweight (obese).  Use bed rest for only the most extreme, sudden (acute) episode. Your caregiver will help you determine how much bed rest is necessary.  For acute conditions, you may put ice on the injured area.  Put ice in a plastic bag.  Place a towel between your skin and the bag.  Leave the ice on for 15-20 minutes at a time, every 2 hours, or as needed.  After you are improved and more active, it may help to  apply heat for 30 minutes before activities. See your caregiver if you are having pain that lasts longer than expected. Your caregiver can advise appropriate exercises or therapy if needed. With conditioning, most back problems can be avoided. SEEK IMMEDIATE MEDICAL CARE IF:   You have numbness, tingling, weakness, or problems with the use of your arms or legs.  You experience severe back pain not relieved with medicines.  There is a change in bowel or bladder control.  You have increasing pain in any area of the body, including your belly (abdomen).  You notice shortness of breath, dizziness, or feel faint.  You feel sick to your stomach (nauseous), are throwing up (vomiting), or become sweaty.  You notice discoloration of your toes or legs, or your feet get very cold.  Your back pain is getting worse.  You have a fever. MAKE SURE YOU:   Understand these instructions.  Will watch your condition.  Will get help right away if you are not doing well or get worse. Document Released: 05/24/2005 Document Revised: 11/06/2011 Document Reviewed: 11/13/2008 [facility] Patient Information 2014 [place], [place].  You were found to have a mass in the ureter as which is likely a fibroid. You do need to have a followup ultrasound.

## 2013-09-02 NOTE — ED Notes (Signed)
Pt ambulated to the bathroom.  

## 2013-09-02 NOTE — ED Provider Notes (Signed)
CSN: [id number]     Arrival date & time 09/02/13  0039 History   First MD Initiated Contact with Patient 09/02/13 0330     Chief Complaint  Patient presents with  . Back Pain   (Consider location/radiation/quality/duration/timing/severity/associated sxs/prior Treatment) HPI  This is a 43 year old female who presents with left sided back pain for the last 2-3 days. Patient reports pain that is worse with ambulation and movement. She feels like it is "spasming."  Reports the pain is currently not attending. She denies any injury. Pain does not improve with ibuprofen. She denies any bowel or bladder symptoms. She denies any dysuria or hematuria.  Past Medical History  Diagnosis Date  . Hypertension   . Type II diabetes mellitus   . Anginal pain   . High cholesterol   . Shortness of breath 04/17/2012    "all the time"  . Headache(784.0) 04/17/2012    "~ qod; lately waking up in am w/one"  . Migraines 04/17/2012  . Chronic lower back pain 04/17/2012    "just got over some; catched when I walked"  . Depression   . Anxiety   . Sleep apnea    Past Surgical History  Procedure Laterality Date  . Cardiac catheterization  ~ 2011   History reviewed. No pertinent family history. History  Substance Use Topics  . Smoking status: Current Some Day Smoker -- 0.08 packs/day for 10 years    Types: Cigarettes  . Smokeless tobacco: Never Used  . Alcohol Use: Yes     Comment: 04/17/2012 "< 1 can of beer once or twice/month"   OB History   Grav Para Term Preterm Abortions TAB SAB Ect Mult Living                 Review of Systems  Constitutional: Negative for fever.  Respiratory: Negative for cough, chest tightness and shortness of breath.   Cardiovascular: Negative for chest pain.  Gastrointestinal: Negative for nausea, vomiting and abdominal pain.  Genitourinary: Negative for dysuria.  Musculoskeletal: Negative for back pain.  Neurological: Negative for weakness, numbness and headaches.   Psychiatric/Behavioral: Negative for confusion.  All other systems reviewed and are negative.    Allergies  Morphine and related  Home Medications   Current Outpatient Rx  Name  Route  Sig  Dispense  Refill  . ibuprofen (ADVIL,MOTRIN) 200 MG tablet   Oral   Take 200 mg by mouth every 6 (six) hours as needed for moderate pain.         Marland Kitchen amLODipine (NORVASC) 10 MG tablet   Oral   Take 1 tablet (10 mg total) by mouth daily. For hypertension.   90 tablet   1   . FLUoxetine (PROZAC) 20 MG capsule   Oral   Take 1 capsule (20 mg total) by mouth daily. For depression.   90 capsule   0   . furosemide (LASIX) 20 MG tablet   Oral   Take 1 tablet (20 mg total) by mouth daily.   90 tablet   1   . glyBURIDE (DIABETA) 5 MG tablet   Oral   Take 1 tablet (5 mg total) by mouth daily with breakfast.   90 tablet   1   . HYDROcodone-acetaminophen (NORCO/VICODIN) 5-325 MG per tablet   Oral   Take 1 tablet by mouth every 6 (six) hours as needed.   10 tablet   0   . ibuprofen (ADVIL,MOTRIN) 600 MG tablet   Oral   Take 1  tablet (600 mg total) by mouth every 6 (six) hours as needed.   30 tablet   0   . lisinopril (PRINIVIL,ZESTRIL) 20 MG tablet   Oral   Take 1 tablet (20 mg total) by mouth daily. For hypertension.   90 tablet   1   . lovastatin (MEVACOR) 20 MG tablet   Oral   Take 1 tablet (20 mg total) by mouth at bedtime.   90 tablet   1   . metFORMIN (GLUCOPHAGE) 1000 MG tablet   Oral   Take 1 tablet (1,000 mg total) by mouth 2 (two) times daily with a meal.   180 tablet   1   . traZODone (DESYREL) 50 MG tablet   Oral   Take 1 tablet (50 mg total) by mouth at bedtime as needed and may repeat dose one time if needed for sleep.   90 tablet   0    BP 163/94  Pulse 79  Temp(Src) 97.5 F (36.4 C) (Oral)  Resp 16  SpO2 99%  LMP 08/25/2013 Physical Exam  Nursing note and vitals reviewed. Constitutional: She is oriented to person, place, and time. She  appears well-developed and well-nourished. No distress.  Obese  HENT:  Head: Normocephalic and atraumatic.  Eyes: Pupils are equal, round, and reactive to light.  Neck: Neck supple.  Cardiovascular: Normal rate, regular rhythm and normal heart sounds.   Pulmonary/Chest: Effort normal. No respiratory distress. She has no wheezes.  Abdominal: Soft. Bowel sounds are normal. There is no tenderness. There is no rebound.  Musculoskeletal:  No midline tenderness to palpation, tenderness palpation over the left sacroiliac joint and left paraspinous muscle region  Neurological: She is alert and oriented to person, place, and time.  5 Out of 5 strength in all 4 extremities  Skin: Skin is warm and dry.  Psychiatric: She has a normal mood and affect.    ED Course  Procedures (including critical care time) Labs Review Labs Reviewed  URINALYSIS, ROUTINE W REFLEX MICROSCOPIC - Abnormal; Notable for the following:    Glucose, UA >1000 (*)    Protein, ur 100 (*)    All other components within normal limits  URINE MICROSCOPIC-ADD ON - Abnormal; Notable for the following:    Casts HYALINE CASTS (*)    Crystals CA OXALATE CRYSTALS (*)    All other components within normal limits  BASIC METABOLIC PANEL - Abnormal; Notable for the following:    Sodium 135 (*)    Potassium 3.4 (*)    Chloride 95 (*)    Glucose, Bld 270 (*)    GFR calc non Af Amer 89 (*)    All other components within normal limits  PREGNANCY, URINE   Imaging Review Ct Abdomen Pelvis Wo Contrast  09/02/2013   CLINICAL DATA:  Back pain  EXAM: CT ABDOMEN AND PELVIS WITHOUT CONTRAST  TECHNIQUE: Multidetector CT imaging of the abdomen and pelvis was performed following the standard protocol without intravenous contrast.  COMPARISON:  02/08/2007 abdominal CT  FINDINGS: BODY WALL: Unremarkable.  LOWER CHEST: Mild atelectasis at the bases.  ABDOMEN/PELVIS:  Liver: No focal abnormality.  Biliary: No evidence of biliary obstruction or stone.   Pancreas: Unremarkable.  Spleen: Unremarkable.  Adrenals: Unremarkable.  Kidneys and ureters: No stones or hydronephrosis.  Bladder: Unremarkable.  Reproductive: There is a 4 cm mass posterior to the uterus which is isodense to the uterus. It is discrete from the right ovary which is anterior to the right iliac vessels, and  is thus is most likely a sub serosal fibroid. This area was not imaged previously.  Bowel: No obstruction.  Normal appendix.  Retroperitoneum: There is bilateral internal external chain lymphadenopathy. A left pelvic wall node measures 27 x 15 mm for example. Although incompletely imaged previously, there were enlarged nodes in the iliac chains bilaterally in 2008. No visible cause in the groin or upper thighs.  Peritoneum: No free fluid or gas.  Vascular: No acute abnormality.  OSSEOUS: Age advanced degenerative disc disease, especially at L5-S1 where endplate and facet spurring causes advanced bilateral foraminal stenosis. There is at least moderate canal stenosis at the same level.  IMPRESSION: 1. Negative for hydronephrosis or urolithiasis. 2. Degenerative disc and facet disease at L5-S1 that causes advanced foraminal stenoses. 3. 4 cm pelvic mass is most likely a sub serosal fibroid. Outpatient sonographic confirmation recommended. 4. Chronic (since 2008 at least) pelvic lymphadenopathy, of uncertain cause.   Electronically Signed   By: Jorje Guild M.D.   On: 09/02/2013 06:57    EKG Interpretation   None       MDM   1. Lumbar back pain   2. Pelvic mass in female    Patient presents with left-sided back pain. She is nontoxic-appearing on exam.  Patient initially noted to be hypertensive on exam, her pressure improved during stay. Patient was given Norco and Toradol for pain. Urinalysis was obtained to screen for infection. That is negative. It does show calcium oxylate crystals. Patient denies any history of kidney stones. However given pain and calcium oxylate crystals in  her urine Will screen a CT scan. CT scan is negative for nephrolithiasis; however patient was noted to have a 4 cm pelvic mass which is likely a fibroid. I discussed this with the patient who will followup with her primary Dr. for ultrasound. This is likely of no clinical significance today. Patient will be discharged home with pain medication and ibuprofen for lumbosacral sprain.  No evidence of cauda equina at this time and no red flags of back pain.  After history, exam, and medical workup I feel the patient has been appropriately medically screened and is safe for discharge home. Pertinent diagnoses were discussed with the patient. Patient was given return precautions.       Merryl Hacker, MD 09/02/13 [phone]

## 2013-09-02 NOTE — ED Notes (Signed)
Pt in c/o back pain over the last few days, lower back pain and pain is increased with movement, feels like she is having spasms, has been noncompliant with BP medication

## 2013-09-03 ENCOUNTER — Emergency Department (HOSPITAL_COMMUNITY): Payer: Self-pay

## 2013-09-03 ENCOUNTER — Observation Stay (HOSPITAL_COMMUNITY)
Admission: EM | Admit: 2013-09-03 | Discharge: 2013-09-04 | Disposition: A | Discharge status: Home / Self Care | Payer: Self-pay | Attending: Family Medicine | Admitting: Family Medicine

## 2013-09-03 ENCOUNTER — Encounter (HOSPITAL_COMMUNITY): Payer: Self-pay | Admitting: Emergency Medicine

## 2013-09-03 DIAGNOSIS — I209 Angina pectoris, unspecified: Secondary | ICD-10-CM | POA: Insufficient documentation

## 2013-09-03 DIAGNOSIS — R112 Nausea with vomiting, unspecified: Secondary | ICD-10-CM | POA: Insufficient documentation

## 2013-09-03 DIAGNOSIS — R079 Chest pain, unspecified: Secondary | ICD-10-CM

## 2013-09-03 DIAGNOSIS — Z888 Allergy status to other drugs, medicaments and biological substances status: Secondary | ICD-10-CM | POA: Insufficient documentation

## 2013-09-03 DIAGNOSIS — IMO0002 Reserved for concepts with insufficient information to code with codable children: Secondary | ICD-10-CM

## 2013-09-03 DIAGNOSIS — M545 Low back pain, unspecified: Secondary | ICD-10-CM

## 2013-09-03 DIAGNOSIS — F17200 Nicotine dependence, unspecified, uncomplicated: Secondary | ICD-10-CM | POA: Insufficient documentation

## 2013-09-03 DIAGNOSIS — R51 Headache: Secondary | ICD-10-CM | POA: Insufficient documentation

## 2013-09-03 DIAGNOSIS — F1414 Cocaine abuse with cocaine-induced mood disorder: Secondary | ICD-10-CM

## 2013-09-03 DIAGNOSIS — E1165 Type 2 diabetes mellitus with hyperglycemia: Secondary | ICD-10-CM

## 2013-09-03 DIAGNOSIS — E785 Hyperlipidemia, unspecified: Secondary | ICD-10-CM | POA: Insufficient documentation

## 2013-09-03 DIAGNOSIS — L301 Dyshidrosis [pompholyx]: Secondary | ICD-10-CM | POA: Insufficient documentation

## 2013-09-03 DIAGNOSIS — Z79899 Other long term (current) drug therapy: Secondary | ICD-10-CM | POA: Insufficient documentation

## 2013-09-03 DIAGNOSIS — E7800 Pure hypercholesterolemia, unspecified: Secondary | ICD-10-CM | POA: Insufficient documentation

## 2013-09-03 DIAGNOSIS — F3289 Other specified depressive episodes: Secondary | ICD-10-CM | POA: Insufficient documentation

## 2013-09-03 DIAGNOSIS — Z95818 Presence of other cardiac implants and grafts: Secondary | ICD-10-CM | POA: Insufficient documentation

## 2013-09-03 DIAGNOSIS — I252 Old myocardial infarction: Secondary | ICD-10-CM | POA: Insufficient documentation

## 2013-09-03 DIAGNOSIS — R5383 Other fatigue: Secondary | ICD-10-CM

## 2013-09-03 DIAGNOSIS — R0789 Other chest pain: Principal | ICD-10-CM | POA: Insufficient documentation

## 2013-09-03 DIAGNOSIS — G4730 Sleep apnea, unspecified: Secondary | ICD-10-CM | POA: Insufficient documentation

## 2013-09-03 DIAGNOSIS — F411 Generalized anxiety disorder: Secondary | ICD-10-CM | POA: Insufficient documentation

## 2013-09-03 DIAGNOSIS — Z6841 Body Mass Index (BMI) 40.0 and over, adult: Secondary | ICD-10-CM

## 2013-09-03 DIAGNOSIS — G8929 Other chronic pain: Secondary | ICD-10-CM | POA: Insufficient documentation

## 2013-09-03 DIAGNOSIS — R5381 Other malaise: Secondary | ICD-10-CM | POA: Insufficient documentation

## 2013-09-03 DIAGNOSIS — I10 Essential (primary) hypertension: Secondary | ICD-10-CM | POA: Insufficient documentation

## 2013-09-03 DIAGNOSIS — F329 Major depressive disorder, single episode, unspecified: Secondary | ICD-10-CM | POA: Insufficient documentation

## 2013-09-03 DIAGNOSIS — G43909 Migraine, unspecified, not intractable, without status migrainosus: Secondary | ICD-10-CM | POA: Insufficient documentation

## 2013-09-03 DIAGNOSIS — E119 Type 2 diabetes mellitus without complications: Secondary | ICD-10-CM | POA: Insufficient documentation

## 2013-09-03 LAB — POCT I-STAT TROPONIN I
TROPONIN I, POC: 0.01 ng/mL (ref 0.00–0.08)
Troponin i, poc: 0 ng/mL (ref 0.00–0.08)

## 2013-09-03 LAB — CBC
HCT: 39.1 % (ref 36.0–46.0)
HEMOGLOBIN: 12.9 g/dL (ref 12.0–15.0)
MCH: 24.2 pg — ABNORMAL LOW (ref 26.0–34.0)
MCHC: 33 g/dL (ref 30.0–36.0)
MCV: 73.4 fL — AB (ref 78.0–100.0)
Platelets: 296 10*3/uL (ref 150–400)
RBC: 5.33 MIL/uL — ABNORMAL HIGH (ref 3.87–5.11)
RDW: 16.7 % — ABNORMAL HIGH (ref 11.5–15.5)
WBC: 9.3 10*3/uL (ref 4.0–10.5)

## 2013-09-03 LAB — COMPREHENSIVE METABOLIC PANEL
ALK PHOS: 91 U/L (ref 39–117)
ALT: 12 U/L (ref 0–35)
AST: 12 U/L (ref 0–37)
Albumin: 3.3 g/dL — ABNORMAL LOW (ref 3.5–5.2)
BUN: 15 mg/dL (ref 6–23)
CO2: 27 meq/L (ref 19–32)
Calcium: 9.7 mg/dL (ref 8.4–10.5)
Chloride: 95 mEq/L — ABNORMAL LOW (ref 96–112)
Creatinine, Ser: 0.82 mg/dL (ref 0.50–1.10)
GFR calc non Af Amer: 88 mL/min — ABNORMAL LOW (ref >90)
Glucose, Bld: 293 mg/dL — ABNORMAL HIGH (ref 70–99)
Potassium: 3.8 mEq/L (ref 3.7–5.3)
SODIUM: 139 meq/L (ref 137–147)
Total Bilirubin: 0.2 mg/dL — ABNORMAL LOW (ref 0.3–1.2)
Total Protein: 8.1 g/dL (ref 6.0–8.3)

## 2013-09-03 LAB — URINALYSIS, ROUTINE W REFLEX MICROSCOPIC
BILIRUBIN URINE: NEGATIVE
Glucose, UA: >1000 mg/dL — AB
Hgb urine dipstick: NEGATIVE
Ketones, ur: NEGATIVE mg/dL
Leukocytes, UA: NEGATIVE
Nitrite: NEGATIVE
Protein, ur: 30 mg/dL — AB
SPECIFIC GRAVITY, URINE: 1.02 (ref 1.005–1.030)
Urobilinogen, UA: 0.2 mg/dL (ref 0.0–1.0)
pH: 7 (ref 5.0–8.0)

## 2013-09-03 LAB — RAPID URINE DRUG SCREEN, HOSP PERFORMED
Amphetamines: NOT DETECTED
BARBITURATES: NOT DETECTED
Benzodiazepines: NOT DETECTED
Cocaine: NOT DETECTED
Opiates: NOT DETECTED
Tetrahydrocannabinol: NOT DETECTED

## 2013-09-03 LAB — URINE MICROSCOPIC-ADD ON

## 2013-09-03 LAB — PRO B NATRIURETIC PEPTIDE: Pro B Natriuretic peptide (BNP): 168.8 pg/mL — ABNORMAL HIGH (ref 0–125)

## 2013-09-03 LAB — GLUCOSE, CAPILLARY: Glucose-Capillary: 252 mg/dL — ABNORMAL HIGH (ref 70–99)

## 2013-09-03 LAB — TROPONIN I

## 2013-09-03 MED ORDER — ASPIRIN 81 MG PO CHEW
324.0000 mg | CHEWABLE_TABLET | Freq: Once | ORAL | Status: AC
  Administered 2013-09-03: 324 mg via ORAL
  Filled 2013-09-03: qty 4

## 2013-09-03 MED ORDER — ACETAMINOPHEN 325 MG PO TABS
650.0000 mg | ORAL_TABLET | ORAL | Status: DC | PRN
  Administered 2013-09-04: 650 mg via ORAL
  Filled 2013-09-03: qty 2

## 2013-09-03 MED ORDER — NITROGLYCERIN 0.4 MG SL SUBL: 0.4000 mg | SUBLINGUAL_TABLET | SUBLINGUAL | Status: DC | PRN

## 2013-09-03 MED ORDER — INSULIN ASPART 100 UNIT/ML ~~LOC~~ SOLN: 0.0000 [IU] | Freq: Three times a day (TID) | SUBCUTANEOUS | Status: DC

## 2013-09-03 MED ORDER — HEPARIN SODIUM (PORCINE) 5000 UNIT/ML IJ SOLN
5000.0000 [IU] | Freq: Three times a day (TID) | INTRAMUSCULAR | Status: DC
  Administered 2013-09-03 – 2013-09-04 (×2): 5000 [IU] via SUBCUTANEOUS
  Filled 2013-09-03 (×5): qty 1

## 2013-09-03 MED ORDER — ONDANSETRON HCL 4 MG/2ML IJ SOLN: 4.0000 mg | Freq: Four times a day (QID) | INTRAMUSCULAR | Status: DC | PRN

## 2013-09-03 MED ORDER — LABETALOL HCL 5 MG/ML IV SOLN: 10.0000 mg | INTRAVENOUS | Status: DC | PRN

## 2013-09-03 MED ORDER — ASPIRIN EC 81 MG PO TBEC
81.0000 mg | DELAYED_RELEASE_TABLET | Freq: Every day | ORAL | Status: DC
  Administered 2013-09-03 – 2013-09-04 (×2): 81 mg via ORAL
  Filled 2013-09-03 (×2): qty 1

## 2013-09-03 MED ORDER — LABETALOL HCL 5 MG/ML IV SOLN
10.0000 mg | Freq: Once | INTRAVENOUS | Status: AC
  Administered 2013-09-04: 10 mg via INTRAVENOUS
  Filled 2013-09-03: qty 4

## 2013-09-03 MED ORDER — ONDANSETRON 4 MG PO TBDP
8.0000 mg | ORAL_TABLET | Freq: Once | ORAL | Status: AC
  Administered 2013-09-03: 8 mg via ORAL
  Filled 2013-09-03: qty 2

## 2013-09-03 MED ORDER — LISINOPRIL 20 MG PO TABS
20.0000 mg | ORAL_TABLET | Freq: Every day | ORAL | Status: DC
  Administered 2013-09-04: 20 mg via ORAL
  Filled 2013-09-03: qty 1

## 2013-09-03 MED ORDER — AMLODIPINE BESYLATE 10 MG PO TABS
10.0000 mg | ORAL_TABLET | Freq: Every day | ORAL | Status: DC
  Administered 2013-09-03 – 2013-09-04 (×2): 10 mg via ORAL
  Filled 2013-09-03 (×2): qty 1

## 2013-09-03 MED ORDER — FUROSEMIDE 20 MG PO TABS
20.0000 mg | ORAL_TABLET | Freq: Every day | ORAL | Status: DC
  Administered 2013-09-03 – 2013-09-04 (×2): 20 mg via ORAL
  Filled 2013-09-03 (×2): qty 1

## 2013-09-03 NOTE — ED Notes (Signed)
Pt is here with nausea that started last nite, vomited today times three, and reports that chest pain started last nite with sob.

## 2013-09-03 NOTE — ED Provider Notes (Signed)
CSN: [id number]     Arrival date & time 09/03/13  1419 History   First MD Initiated Contact with Patient 09/03/13 1507     Chief Complaint  Patient presents with  . Emesis  . Chest Pain  . Weakness   (Consider location/radiation/quality/duration/timing/severity/associated sxs/prior Treatment) HPI Comments: Patient is a 43 year old female past medical history significant for hypertension, DM, HLD, migraines, chronic low back pain, depression, anxiety, hx of MI, tobacco use presenting to the ED for a left-sided chest pressure with associated diaphoresis, nausea, vomiting that began last evening around 9 PM at rest. Patient states that her pain is currently 10/10. She states she did not take any to try to help alleviate her pain. She denies any aggravating factors. Patient states she is unable to tolerate her home medications this morning due to nausea. Patient states this chest pain was worrisome to her because it felt like previous onset of chest pain that required hospitalization last year in [place], requiring ICU stay and cardiac catheterization. Patient states that she was told she had a 40% blockage but no stent placed. She states she was admitted here in September for similar episode with negative workup. Patient mother died of a myocardial infarction at age 98.  Patient is a 43 y.o. female presenting with vomiting, chest pain, and weakness.  Emesis Associated symptoms: headaches   Associated symptoms: no chills   Chest Pain Associated symptoms: diaphoresis, fatigue, headache, nausea, vomiting and weakness   Associated symptoms: no fever   Weakness Associated symptoms include chest pain, diaphoresis, fatigue, headaches, nausea, vomiting and weakness. Pertinent negatives include no chills or fever.    Past Medical History  Diagnosis Date  . Hypertension   . Type II diabetes mellitus   . Anginal pain   . High cholesterol   . Shortness of breath 04/17/2012    "all the time"  .  Headache(784.0) 04/17/2012    "~ qod; lately waking up in am w/one"  . Migraines 04/17/2012  . Chronic lower back pain 04/17/2012    "just got over some; catched when I walked"  . Depression   . Anxiety   . Sleep apnea    Past Surgical History  Procedure Laterality Date  . Cardiac catheterization  ~ 2011   No family history on file. History  Substance Use Topics  . Smoking status: Current Some Day Smoker -- 0.08 packs/day for 10 years    Types: Cigarettes  . Smokeless tobacco: Never Used  . Alcohol Use: Yes     Comment: 04/17/2012 "< 1 can of beer once or twice/month"   OB History   Grav Para Term Preterm Abortions TAB SAB Ect Mult Living                 Review of Systems  Constitutional: Positive for diaphoresis and fatigue. Negative for fever and chills.  Cardiovascular: Positive for chest pain.  Gastrointestinal: Positive for nausea and vomiting.  Neurological: Positive for headaches.  All other systems reviewed and are negative.    Allergies  Morphine and related  Home Medications   Current Outpatient Rx  Name  Route  Sig  Dispense  Refill  . amLODipine (NORVASC) 10 MG tablet   Oral   Take 1 tablet (10 mg total) by mouth daily. For hypertension.   90 tablet   1   . furosemide (LASIX) 20 MG tablet   Oral   Take 1 tablet (20 mg total) by mouth daily.   90 tablet  1   . ibuprofen (ADVIL,MOTRIN) 200 MG tablet   Oral   Take 200 mg by mouth every 6 (six) hours as needed for moderate pain.         Marland Kitchen lisinopril (PRINIVIL,ZESTRIL) 20 MG tablet   Oral   Take 1 tablet (20 mg total) by mouth daily. For hypertension.   90 tablet   1   . metFORMIN (GLUCOPHAGE) 1000 MG tablet   Oral   Take 1 tablet (1,000 mg total) by mouth 2 (two) times daily with a meal.   180 tablet   1    BP 173/96  Pulse 81  Temp(Src) 97.7 F (36.5 C) (Oral)  Resp 23  SpO2 99%  LMP 08/25/2013 Physical Exam  Constitutional: She is oriented to person, place, and time. She appears  well-developed and well-nourished. No distress.  HENT:  Head: Normocephalic and atraumatic.  Right Ear: External ear normal.  Left Ear: External ear normal.  Nose: Nose normal.  Mouth/Throat: Oropharynx is clear and moist. No oropharyngeal exudate.  Eyes: Conjunctivae are normal.  Neck: Normal range of motion. Neck supple.  Cardiovascular: Normal rate, regular rhythm, normal heart sounds and intact distal pulses.   Pulmonary/Chest: Effort normal and breath sounds normal. No respiratory distress. She exhibits no tenderness.  Abdominal: Soft. There is no tenderness.  Musculoskeletal: Normal range of motion. She exhibits no edema.  Neurological: She is alert and oriented to person, place, and time.  Skin: Skin is warm and dry. She is not diaphoretic.    ED Course  Procedures (including critical care time) Medications  nitroGLYCERIN (NITROSTAT) SL tablet 0.4 mg (not administered)  aspirin chewable tablet 324 mg (324 mg Oral Given 09/03/13 1542)  ondansetron (ZOFRAN-ODT) disintegrating tablet 8 mg (8 mg Oral Given 09/03/13 1541)    Labs Review Labs Reviewed  CBC - Abnormal; Notable for the following:    RBC 5.33 (*)    MCV 73.4 (*)    MCH 24.2 (*)    RDW 16.7 (*)    All other components within normal limits  PRO B NATRIURETIC PEPTIDE - Abnormal; Notable for the following:    Pro B Natriuretic peptide (BNP) 168.8 (*)    All other components within normal limits  COMPREHENSIVE METABOLIC PANEL - Abnormal; Notable for the following:    Chloride 95 (*)    Glucose, Bld 293 (*)    Albumin 3.3 (*)    Total Bilirubin 0.2 (*)    GFR calc non Af Amer 88 (*)    All other components within normal limits  URINALYSIS, ROUTINE W REFLEX MICROSCOPIC - Abnormal; Notable for the following:    Glucose, UA >1000 (*)    Protein, ur 30 (*)    All other components within normal limits  URINE RAPID DRUG SCREEN (HOSP PERFORMED)  URINE MICROSCOPIC-ADD ON  POCT I-STAT TROPONIN I  POCT I-STAT TROPONIN I    Imaging Review Ct Abdomen Pelvis Wo Contrast  09/02/2013   CLINICAL DATA:  Back pain  EXAM: CT ABDOMEN AND PELVIS WITHOUT CONTRAST  TECHNIQUE: Multidetector CT imaging of the abdomen and pelvis was performed following the standard protocol without intravenous contrast.  COMPARISON:  02/08/2007 abdominal CT  FINDINGS: BODY WALL: Unremarkable.  LOWER CHEST: Mild atelectasis at the bases.  ABDOMEN/PELVIS:  Liver: No focal abnormality.  Biliary: No evidence of biliary obstruction or stone.  Pancreas: Unremarkable.  Spleen: Unremarkable.  Adrenals: Unremarkable.  Kidneys and ureters: No stones or hydronephrosis.  Bladder: Unremarkable.  Reproductive: There is a  4 cm mass posterior to the uterus which is isodense to the uterus. It is discrete from the right ovary which is anterior to the right iliac vessels, and is thus is most likely a sub serosal fibroid. This area was not imaged previously.  Bowel: No obstruction.  Normal appendix.  Retroperitoneum: There is bilateral internal external chain lymphadenopathy. A left pelvic wall node measures 27 x 15 mm for example. Although incompletely imaged previously, there were enlarged nodes in the iliac chains bilaterally in 2008. No visible cause in the groin or upper thighs.  Peritoneum: No free fluid or gas.  Vascular: No acute abnormality.  OSSEOUS: Age advanced degenerative disc disease, especially at L5-S1 where endplate and facet spurring causes advanced bilateral foraminal stenosis. There is at least moderate canal stenosis at the same level.  IMPRESSION: 1. Negative for hydronephrosis or urolithiasis. 2. Degenerative disc and facet disease at L5-S1 that causes advanced foraminal stenoses. 3. 4 cm pelvic mass is most likely a sub serosal fibroid. Outpatient sonographic confirmation recommended. 4. Chronic (since 2008 at least) pelvic lymphadenopathy, of uncertain cause.   Electronically Signed   By: Jorje Guild M.D.   On: 09/02/2013 06:57   Dg Chest 2  View  09/03/2013   CLINICAL DATA:  Chest pain and weakness  EXAM: CHEST  2 VIEW  COMPARISON:  05/22/2013  FINDINGS: Left lingula scar versus atelectasis. Cardiomegaly. Normal vascularity. No pleural effusion or pneumothorax.  IMPRESSION: Cardiomegaly without decompensation.  Chronic change.   Electronically Signed   By: Maryclare Bean M.D.   On: 09/03/2013 14:44    EKG Interpretation   None       MDM   1. Chest pain     Afebrile, NAD, non-toxic appearing, AAOx4.   Concern for cardiac etiology of Chest Pain. Hospitalist has been consulted and will see patient in the ED for likely admit. Pt does not meet criteria for CP protocol and a further evaluation is recommended. Pt has been re-evaluated prior to consult and VSS, NAD, heart RRR, pain 0/10, lungs CTAB. No acute abnormalities found on EKG and first round of cardiac enzymes negative. This case was discussed with Dr. Rogene Houston who has seen the patient and agrees with plan to admit.      Harlow Mares, PA-C 09/03/13 2110

## 2013-09-03 NOTE — H&P (Signed)
[facility] Admission History and Physical Service Pager: [phone]  [name] Medical record number: [id number] Date of birth: [birth year] Age: 43 y.o. Gender: female  Primary Care Provider: Teresa Coombs, DO Consultants: Cardiology Code Status: Full  Chief Complaint: Chest Pain  Assessment and Plan: Diamond Rice is a 43 y.o. female presenting with chest pain. PMH is significant for reported MI in 2014, DM2, HTN, HLD, anxiety, depression.   # Chest Pain - Atypical chest pain: chest tender to palpation, pain worse with stretching; Risk factors: Obesity, DM uncontrolled (A1c 10 on 08/13/13); HTN uncontrolled; Hx of Cocaine use (UDS neg this admission); FHx: MI in mother @ 36. Must consider ACS vs MSK vs pericarditis (no rubs and no increased discomfort on laying down make unlikely) vs PE (wells score of 0 makes unlikely) vs PNA (no signs of PNA on exam or CXR make this unlikely) vs pneumothorax (no signs of PNA on exam or CXR make this unlikely) - frequent admissions to the emergency room for episodes of chest pain. She is ruled out during these admissions. - She reports MI in 2014 in [place], currently not confirmed - will need to obtain records from [facility] - BNP: 168 - Risk Stratification labs: TSH & Lipids pending - EKG: NSR w/ LVH unchanged from previous; Admit to tele  - Repeat EKG in AM - Cycle trops; istat Trop neg in ED - ASA 81 mg - consider outpatient cards follow-up as long as she rules out  # DM 2  - Holding home Metformin - SSI w/ meals   # HTN - BP ~ 200/100 when arrived on floor; Labetalol 10 mg given & prn for BPs > 180/100 - Continue home Lasix 20mg  q; Norvasc 10mg  qd; Lisinopril 20 mg qd  # CKD stage 2 - Cr 1.13 on admission; GFR 60 - BMET in am  FEN/GI: Carb Mod diet; SLIV Prophylaxis: Heparin  Disposition: Admit to observation for MI rule-out; Attending [place]; discharge likely tomorrow if work-up  neg  History of Present Illness: Diamond Rice is a 43 y.o. female presenting with vomiting, weakness and chest pain. The left-sided "heavy" 9/10 chest pain started while she was lying down last night.. She endorsed diaphoresis last night "soak shirt", and the pain was made worse with stretching. She reports similar chest pain with her previous MI in 2014. Hx of her mother having MI at age 10.   Patient states pain started in the left side of her chest. She had shooting pains across the left side of her chest and then developed a pressure in that area. It did not radiate to her neck or arms. She notes some shortness of breath with this this morning, though none last night. She notes a history of MI this past June and being admitted to the ICU in [place], though there is no mention of this in the cardiology consult note from her last hospitalization. She also reports having a catheterization in the past 1.5 years that she states had one vessel with 40% blockage. Per the cardiology consult note from her last hospitalization the patient has had repeated episodes of this type of pain and has ruled out each time. They did not plan to do any further work up at that time.  Review Of Systems: Per HPI with the following additions:  Otherwise 12 point review of systems was performed and was unremarkable.  Patient Active Problem List   Diagnosis Date Noted  . Psychoactive  substance-induced organic mood disorder 05/28/2013  . Cocaine abuse with cocaine-induced mood disorder 05/28/2013  . Cannabis dependence with cannabis-induced anxiety disorder 05/28/2013  . Morbid obesity with BMI of 45.0-49.9, adult 04/25/2012  . Chest pain 04/17/2012  . Hypertension 04/17/2012  . DM (diabetes mellitus), type 2, uncontrolled 04/17/2012  . Hyperlipidemia 04/17/2012  . Tobacco use 04/17/2012   Past Medical History: Past Medical History  Diagnosis Date  . Hypertension   . Type II diabetes mellitus   . Anginal pain    . High cholesterol   . Shortness of breath 04/17/2012    "all the time"  . Headache(784.0) 04/17/2012    "~ qod; lately waking up in am w/one"  . Migraines 04/17/2012  . Chronic lower back pain 04/17/2012    "just got over some; catched when I walked"  . Depression   . Anxiety   . Sleep apnea    Past Surgical History: Past Surgical History  Procedure Laterality Date  . Cardiac catheterization  ~ 2011   Social History: History  Substance Use Topics  . Smoking status: Current Some Day Smoker -- 0.08 packs/day for 10 years    Types: Cigarettes  . Smokeless tobacco: Never Used  . Alcohol Use: Yes     Comment: 04/17/2012 "< 1 can of beer once or twice/month"   Additional social history:   Please also refer to relevant sections of EMR.  Family History: No family history on file. Allergies and Medications: Allergies  Allergen Reactions  . Morphine And Related Hives   No current facility-administered medications on file prior to encounter.   Current Outpatient Prescriptions on File Prior to Encounter  Medication Sig Dispense Refill  . amLODipine (NORVASC) 10 MG tablet Take 1 tablet (10 mg total) by mouth daily. For hypertension.  90 tablet  1  . furosemide (LASIX) 20 MG tablet Take 1 tablet (20 mg total) by mouth daily.  90 tablet  1  . ibuprofen (ADVIL,MOTRIN) 200 MG tablet Take 200 mg by mouth every 6 (six) hours as needed for moderate pain.      Marland Kitchen lisinopril (PRINIVIL,ZESTRIL) 20 MG tablet Take 1 tablet (20 mg total) by mouth daily. For hypertension.  90 tablet  1  . metFORMIN (GLUCOPHAGE) 1000 MG tablet Take 1 tablet (1,000 mg total) by mouth 2 (two) times daily with a meal.  180 tablet  1   Objective: BP 173/96  Pulse 81  Temp(Src) 97.7 F (36.5 C) (Oral)  Resp 23  SpO2 99%  LMP 08/25/2013 Exam: General: Obese female; NAD HEENT: PERRLA, EOMI; MMM Cardiovascular: RRR, no m/r/g; Left upper chest tender to palpation Respiratory: CTAB Abdomen: Soft,  NT/ND Extremities: WWP; No LE edema  Labs and Imaging: CBC BMET   Recent Labs Lab 09/03/13 1429  WBC 9.3  HGB 12.9  HCT 39.1  PLT 296    Recent Labs Lab 09/03/13 1429  NA 139  K 3.8  CL 95*  CO2 27  BUN 15  CREATININE 0.82  GLUCOSE 293*  CALCIUM 9.7     CXR 1/7 Cardiomegaly without decompensation.   Phill Myron, MD 09/03/2013, 6:53 PM PGY-1, [facility] Intern pager: [phone], text pages welcome  Upper Level Addendum:  I have seen and evaluated this patient along with Dr. Berkley Harvey and reviewed the above note, making necessary revisions in red.   Tommi Rumps, MD Family Medicine PGY-2

## 2013-09-04 ENCOUNTER — Encounter (HOSPITAL_COMMUNITY): Payer: Self-pay | Admitting: General Practice

## 2013-09-04 DIAGNOSIS — IMO0001 Reserved for inherently not codable concepts without codable children: Secondary | ICD-10-CM

## 2013-09-04 DIAGNOSIS — I10 Essential (primary) hypertension: Secondary | ICD-10-CM

## 2013-09-04 DIAGNOSIS — E785 Hyperlipidemia, unspecified: Secondary | ICD-10-CM

## 2013-09-04 DIAGNOSIS — R079 Chest pain, unspecified: Secondary | ICD-10-CM

## 2013-09-04 DIAGNOSIS — M545 Low back pain, unspecified: Secondary | ICD-10-CM

## 2013-09-04 DIAGNOSIS — E1165 Type 2 diabetes mellitus with hyperglycemia: Secondary | ICD-10-CM

## 2013-09-04 DIAGNOSIS — F1994 Other psychoactive substance use, unspecified with psychoactive substance-induced mood disorder: Secondary | ICD-10-CM

## 2013-09-04 DIAGNOSIS — Z6841 Body Mass Index (BMI) 40.0 and over, adult: Secondary | ICD-10-CM

## 2013-09-04 LAB — CBC
HCT: 38.3 % (ref 36.0–46.0)
Hemoglobin: 12.5 g/dL (ref 12.0–15.0)
MCH: 24 pg — AB (ref 26.0–34.0)
MCHC: 32.6 g/dL (ref 30.0–36.0)
MCV: 73.5 fL — AB (ref 78.0–100.0)
Platelets: 313 10*3/uL (ref 150–400)
RBC: 5.21 MIL/uL — AB (ref 3.87–5.11)
RDW: 16.4 % — ABNORMAL HIGH (ref 11.5–15.5)
WBC: 8.7 10*3/uL (ref 4.0–10.5)

## 2013-09-04 LAB — LIPID PANEL
Cholesterol: 202 mg/dL — ABNORMAL HIGH (ref 0–200)
HDL: 48 mg/dL (ref >39)
LDL Cholesterol: 107 mg/dL — ABNORMAL HIGH (ref 0–99)
Total CHOL/HDL Ratio: 4.2 RATIO
Triglycerides: 236 mg/dL — ABNORMAL HIGH (ref <150)
VLDL: 47 mg/dL — ABNORMAL HIGH (ref 0–40)

## 2013-09-04 LAB — GLUCOSE, CAPILLARY
GLUCOSE-CAPILLARY: 250 mg/dL — AB (ref 70–99)
Glucose-Capillary: 258 mg/dL — ABNORMAL HIGH (ref 70–99)

## 2013-09-04 LAB — BASIC METABOLIC PANEL
BUN: 13 mg/dL (ref 6–23)
CALCIUM: 9.1 mg/dL (ref 8.4–10.5)
CO2: 28 meq/L (ref 19–32)
CREATININE: 0.82 mg/dL (ref 0.50–1.10)
Chloride: 95 mEq/L — ABNORMAL LOW (ref 96–112)
GFR calc Af Amer: >90 mL/min (ref >90)
GFR, EST NON AFRICAN AMERICAN: 88 mL/min — AB (ref >90)
Glucose, Bld: 244 mg/dL — ABNORMAL HIGH (ref 70–99)
Potassium: 3.9 mEq/L (ref 3.7–5.3)
Sodium: 136 mEq/L — ABNORMAL LOW (ref 137–147)

## 2013-09-04 LAB — TSH: TSH: 0.498 u[IU]/mL (ref 0.350–4.500)

## 2013-09-04 LAB — TROPONIN I: Troponin I: <0.3 ng/mL (ref <0.30)

## 2013-09-04 MED ORDER — PRAVASTATIN SODIUM 40 MG PO TABS: 40.0000 mg | ORAL_TABLET | Freq: Every day | ORAL | Status: DC

## 2013-09-04 MED ORDER — ATORVASTATIN CALCIUM 40 MG PO TABS: 40.0000 mg | ORAL_TABLET | Freq: Every day | ORAL | Status: DC

## 2013-09-04 MED ORDER — ASPIRIN 81 MG PO TBEC: 81.0000 mg | DELAYED_RELEASE_TABLET | Freq: Every day | ORAL | Status: DC

## 2013-09-04 MED ORDER — ATORVASTATIN CALCIUM 40 MG PO TABS
40.0000 mg | ORAL_TABLET | Freq: Every day | ORAL | Status: DC
  Filled 2013-09-04: qty 1

## 2013-09-04 NOTE — Progress Notes (Signed)
Family Medicine Teaching Service Daily Progress Note Intern Pager: [phone]  [name] Medical record number: [id number] Date of birth: Jan 21, 1971 Age: 43 y.o. Gender: female  Primary Care Provider: Teresa Coombs, DO Consultants: Cardiology  Code Status: Full  Pt Overview and Major Events to Date:  1/8: Atypical chest pain - ruleout; Questionable Hx of MI in [place] this past year  Assessment and Plan: Diamond Rice is a 43 y.o. female presenting with chest pain. PMH is significant for reported MI in 2014, DM2, HTN, HLD, anxiety, depression.   # Chest Pain  - Atypical chest pain: chest tender to palpation, pain worse with stretching; Risk factors: Obesity, DM uncontrolled (A1c 10 on 08/13/13); HTN uncontrolled; Hx of Cocaine use (UDS neg this admission); FHx: MI in mother @ 41. Must consider ACS vs MSK vs pericarditis (no rubs and no increased discomfort on laying down make unlikely) vs PE (wells score of 0 makes unlikely) vs PNA (no signs of PNA on exam or CXR make this unlikely) vs pneumothorax (no signs of PNA on exam or CXR make this unlikely)  - frequent admissions to the emergency room for episodes of chest pain. She is ruled out during these admissions.  - She reports MI in 2014 in [place], currently not confirmed - will need to obtain records from [facility]  - BNP: 168  - Risk Stratification labs: TSH pending; LDL 107- CV Risk score 30% (will start statin) - EKG: NSR w/ LVH unchanged from previous; Admit to tele   - AM EKG: unchanged from admission  - Trops Neg x 2; istat Trop neg in ED  - ASA 81 mg  - Lipitor 40 mg started 1/8 - consider outpatient cards follow-up as long as she rules out   # DM 2  - Holding home Metformin  - SSI w/ meals   # HTN  - BP ~ 200/100 when arrived on floor; Labetalol 10 mg given & prn for BPs > 180/100  - BP now: 159/95 - Continue home Lasix 20mg  q; Norvasc 10mg  qd; Lisinopril 20 mg qd   # CKD stage 2: GFR 60  - Cr today:  0.82  FEN/GI: Carb Mod diet; SLIV  Prophylaxis: Heparin   Disposition: Admit to observation for MI rule-out; Attending [place]; discharge likely tomorrow if work-up neg  Subjective: Denies any chest pain today, or SOB.   Objective: Temp:  [97.4 F (36.3 C)-98.3 F (36.8 C)] 97.9 F (36.6 C) (01/08 0558) Pulse Rate:  [79-97] 88 (01/08 0558) Resp:  [11-25] 18 (01/08 0558) BP: (141-206)/(82-122) 159/95 mmHg (01/08 0558) SpO2:  [97 %-100 %] 100 % (01/08 0558) Weight:  [251 lb (113.853 kg)] 251 lb (113.853 kg) (01/07 2044) Physical Exam: General: Obese female; NAD  HEENT: PERRLA, EOMI; MMM  Cardiovascular: RRR, no m/r/g; Left upper chest tender to palpation  Respiratory: CTAB  Abdomen: Soft, NT/ND  Extremities: WWP; No LE edema  Laboratory:  Recent Labs Lab 09/03/13 1429  WBC 9.3  HGB 12.9  HCT 39.1  PLT 296    Recent Labs Lab 09/02/13 0530 09/03/13 1429  NA 135* 139  K 3.4* 3.8  CL 95* 95*  CO2 25 27  BUN 11 15  CREATININE 0.81 0.82  CALCIUM 8.9 9.7  PROT  --  8.1  BILITOT  --  0.2*  ALKPHOS  --  91  ALT  --  12  AST  --  12  GLUCOSE 270* 293*     Recent Labs Lab  09/03/13 2234 09/04/13 0528  TROPONINI <0.30 <0.30   BNP    Component Value Date/Time   PROBNP 168.8* 09/03/2013 1429     Imaging/Diagnostic Tests: CXR 1/7  Cardiomegaly without decompensation.   Phill Myron, MD 09/04/2013, 7:27 AM PGY-1, [facility] Intern pager: [phone], text pages welcome

## 2013-09-04 NOTE — Progress Notes (Signed)
FMTS Attending  Note: Diamond Oliva,MD I  have seen and examined this patient, reviewed their chart. I have discussed this patient with the resident. I agree with the resident's findings, assessment and care plan.  

## 2013-09-04 NOTE — Progress Notes (Signed)
Family Medicine Teaching Service PCP NOTE Service Pager: [phone]  NAYDENE KAMROWSKI 43 y.o. female  MRN: [id number]  DOB: [birth year]   Primary Care Provider:   Gerda Diss, DO  Consultants:  cardiology     BRIEF PATIENT OVERVIEW   LOS: 1 day  43 y.o. year old female who was admitted overnight for recurrent atypical chest pain.  Currently ruled out from an ACS standpoint.  She has had multiple admissions similar to this in the past and prior ischemic workups that were unrevealing.  She does have a history of hospitalization in [place] in July of 2014.  We will request these records once again and I will be more than happy to followup with her in clinic.    Medication compliance, housing and food safety continue to be issues and she will likely benefit from outpatient social work, case management and trying to obtain the [facility] [employer] card/MAP services.   Office visit with me on January 15.  Agree with discharge today.  Gerda Diss, DO [facility] Resident - PGY-3 09/04/2013 9:33 AM

## 2013-09-04 NOTE — H&P (Addendum)
FMTS Attending Note  I personally saw and evaluated the patient. The plan of care was discussed with the resident team. I agree with the assessment and plan as documented by the resident.   43 year old female with past medical history of myocardial infarction in 2014, insulin-dependent type 2 diabetes, hypertension, hyperlipidemia, depression, anxiety, history of cocaine abuse presents with chest pain. Please refer to resident dictation for history of present illness. Patient is currently asymptomatic, she does report some left lower back pain without radiation which has been chronic, currently denies shortness of breath, no palpitations, no diaphoresis  Social-patient is currently homeless, is uninsured, has difficulty obtaining medications secondary to cost  Vitals: Reviewed General: Pleasant African American female, lying in hospital bed, no acute distress HEENT: Pupils equal in size, extraocular movements are intact, sclera is, moist mucous members, neck was supple Cardiac: Regular rate and rhythm, distant heart sounds, S1 and S2 present, 3/6 systolic murmur, no heaves or thrills Respiratory: Clear to auscultation bilaterally, normal effort Abdomen: Soft, nontender, bowel sounds present MSK: Left lumbar her spinal tenderness, straight leg testing negative bilaterally Neuro: Strength is 5 out of 5 in bilateral lower extremities, sensation to gross touch was intact  Reviewed lab work and EKG  #1. Chest pain-cardiac workup negative thus far, will complete cardiac workup and if negative will set up with outpatient cardiac referral,  unable to view records from previous hospitalization in [place], will have residents attempt to obtain previous cardiac records #2. Hypertension-improved since time of admission, continue home Norvasc, lisinopril, and Lasix. Agree with as needed labetalol. #3. Insulin-dependent type 2 diabetes currently a controlled with last A1c of 10.0 and December 2014, restart  home metformin, work with patient assistance to patient get home Lantus (she states at home doses Lantus 10 units daily), patient also requires a prescription for new blood glucose monitor  #4. Lumbar back pain likely musculoskeletal in origin, no acute red flags to suggest spinal cord involvement, recommended heat while in hospital and followup with PCP as outpatient  Disposition-likely home later today if cardiac workup negative and able to provide patient with prescription assistance  Dossie Arbour M.D.

## 2013-09-04 NOTE — Discharge Summary (Signed)
FMTS Attending  Note: Diamond Boylan,MD I  have seen and examined this patient, reviewed their chart. I have discussed this patient with the resident. I agree with the resident's findings, assessment and care plan.  43 Y/O F with Pmx  DM2,CKD, HTN,HTN who presented to the hospital with hx of chest pain which has since then resolved,her cardiac work up including troponin's and EKG were not suggestive of ACS. Continue ASA,Lipitor and DM as well as BP control, recommending stress test as an outpatient, PCP will set this up.

## 2013-09-04 NOTE — Progress Notes (Signed)
Pt provided with dc instructions and educatino. Pt verbalized understanding. Pt has no questions. IV removed with tip intact. Heart monitor cleaned and returned to front. Dorna Bloom, RN

## 2013-09-04 NOTE — Discharge Summary (Signed)
Diamond Rice  [name] Medical record number: [id number] Date of birth: 1971/03/18 Age: 43 y.o. Gender: female Date of Admission: 09/03/2013  Date of Discharge: 1815 Admitting Physician: Lupita Dawn, MD  Primary Care Provider: Teresa Coombs, DO Consultants: Cardiology  Indication for Hospitalization: Chest pain  Discharge Diagnoses/Problem List:   Chest Pain  DM2; Uncontrolled  CKD stage 2  HTN  HLD  Disposition: Home  Discharge Condition: Stable  Brief Hospital Course: Diamond Rice is a 42 y.o. female who presented with atypical chest pain. PMH is significant for reported MI in 2014, DM2, HTN, HLD, anxiety, depression, and Hx Cocaine use. She reports MI in 2014 in [place], currently not confirmed - trying to obtain records from [facility]. She was ruled out for MI with Negative troponins and unchanged EKG from previous admissions (NSR w/ LVH). Risk labs showed uncontrolled DM, normal TSH, and LDL 107 w/ CV risk ~30%. Her BP was initially elevated to 200s/100s but responded to IV labetalol (159/95 on discharge). SW and Case management were contact to help with homelessness, and [facility] drug program.   Issues for Follow Up:  1. Orange card application 2. Lipitor started in hospital- Able to afford?  Significant Procedures: None  Significant Labs and Imaging:   Recent Labs Lab 09/03/13 1429 09/04/13 0825  WBC 9.3 8.7  HGB 12.9 12.5  HCT 39.1 38.3  PLT 296 313    Recent Labs Lab 09/02/13 0530 09/03/13 1429 09/04/13 0825  NA 135* 139 136*  K 3.4* 3.8 3.9  CL 95* 95* 95*  CO2 25 27 28   GLUCOSE 270* 293* 244*  BUN 11 15 13   CREATININE 0.81 0.82 0.82  CALCIUM 8.9 9.7 9.1  ALKPHOS  --  91  --   AST  --  12  --   ALT  --  12  --   ALBUMIN  --  3.3*  --      Recent Labs Lab 09/03/13 2234 09/04/13 0528 09/04/13 0825  TROPONINI <0.30 <0.30 <0.30   BNP    Component Value Date/Time   PROBNP  168.8* 09/03/2013 1429   Results/Tests Pending at Time of Discharge: None  Discharge Medications:    Medication List    ASK your doctor about these medications       amLODipine 10 MG tablet  Commonly known as:  NORVASC  Take 1 tablet (10 mg total) by mouth daily. For hypertension.     furosemide 20 MG tablet  Commonly known as:  LASIX  Take 1 tablet (20 mg total) by mouth daily.     ibuprofen 200 MG tablet  Commonly known as:  ADVIL,MOTRIN  Take 200 mg by mouth every 6 (six) hours as needed for moderate pain.     lisinopril 20 MG tablet  Commonly known as:  PRINIVIL,ZESTRIL  Take 1 tablet (20 mg total) by mouth daily. For hypertension.     metFORMIN 1000 MG tablet  Commonly known as:  GLUCOPHAGE  Take 1 tablet (1,000 mg total) by mouth 2 (two) times daily with a meal.       Discharge Instructions: Please refer to Patient Instructions section of EMR for full details.  Patient was counseled important signs and symptoms that should prompt return to medical care, changes in medications, dietary instructions, activity restrictions, and follow up appointments.   Follow-Up Appointments: Follow-up Information   Follow up with RIGBY, Wright, DO On 09/11/2013. (Apt at 9:45)  Specialty:  Family Medicine   Contact information:   [address] [address] [phone]       Phill Myron, MD 09/04/2013, 11:44 AM PGY-1, [place]

## 2013-09-04 NOTE — Care Management Note (Signed)
    Page 1 of 1   09/04/2013     12:20:48 PM   CARE MANAGEMENT NOTE 09/04/2013  Patient:  Diamond Rice, Diamond Rice   Account Number:  [id number]  Date Initiated:  09/04/2013  Documentation initiated by:  GRAVES-BIGELOW,Kimberleigh Mehan  Subjective/Objective Assessment:   Pt admitted for cp. Plan for d/c today. Pt has f/u appointment with Family Medicine. CM did reach out to resident to make them aware she needs to be signed up with an orange card at her next f/u visit. Pt is not homeless- staying w/t friend     Action/Plan:   CM received a referral for the [facility]. Most of the medications pt is leaving out on is on [facility] $4.00 list. CM did ask MD to change statin to $4.00 med from [place] list. Pt states she can ask a friend for money for meds.   Anticipated DC Date:  09/04/2013   Anticipated DC Plan:  [place]  CM consult      Choice offered to / List presented to:             Status of service:  Completed, signed off Medicare Important Message given?   (If response is "NO", the following Medicare IM given date fields will be blank) Date Medicare IM given:   Date Additional Medicare IM given:    Discharge Disposition:  HOME/SELF CARE  Per UR Regulation:  Reviewed for med. necessity/level of care/duration of stay  If discussed at [place] of Stay Meetings, dates discussed:    Comments:  [place] not utilized this admission. No further needs form CM at this time. Jacqlyn Krauss, RN,BSN [phone]

## 2013-09-04 NOTE — Consult Note (Signed)
Primary Physician: Primary Cardiologist:   HPI: Patient is a 43 yo who was admtted for CP  She has been seen by cardiology (P Nahser) earlier this fall when she came in for CP Reproted to have cardiac cath in past [place], [place]) She reported mild CAD   No records  Has had 2 negative myoviews here (2008, 2013) She presents with CP  She says pain is sharp and also a pressure  Worse with deep inspiration.  Denies cough.  Breathing is unchanged.  No worsening of pain with inspiration.  Currently she is pain free.           Past Medical History  Diagnosis Date  . Hypertension   . Type II diabetes mellitus   . Anginal pain   . High cholesterol   . Shortness of breath 04/17/2012    "all the time"  . Headache(784.0) 04/17/2012    "~ qod; lately waking up in am w/one"  . Migraines 04/17/2012  . Chronic lower back pain 04/17/2012    "just got over some; catched when I walked"  . Depression   . Anxiety   . Sleep apnea     Medications Prior to Admission  Medication Sig Dispense Refill  . amLODipine (NORVASC) 10 MG tablet Take 1 tablet (10 mg total) by mouth daily. For hypertension.  90 tablet  1  . furosemide (LASIX) 20 MG tablet Take 1 tablet (20 mg total) by mouth daily.  90 tablet  1  . ibuprofen (ADVIL,MOTRIN) 200 MG tablet Take 200 mg by mouth every 6 (six) hours as needed for moderate pain.      Marland Kitchen lisinopril (PRINIVIL,ZESTRIL) 20 MG tablet Take 1 tablet (20 mg total) by mouth daily. For hypertension.  90 tablet  1  . metFORMIN (GLUCOPHAGE) 1000 MG tablet Take 1 tablet (1,000 mg total) by mouth 2 (two) times daily with a meal.  180 tablet  1     . amLODipine  10 mg Oral Daily  . aspirin EC  81 mg Oral Daily  . atorvastatin  40 mg Oral q1800  . furosemide  20 mg Oral Daily  . heparin  5,000 Units Subcutaneous Q8H  . insulin aspart  0-15 Units Subcutaneous TID WC  . lisinopril  20 mg Oral Daily    Infusions:    Allergies  Allergen Reactions  . Morphine And  Related Hives    History   Social History  . Marital Status: Single    Spouse Name: N/A    Number of Children: N/A  . Years of Education: N/A   Occupational History  . Not on file.   Social History Main Topics  . Smoking status: Current Some Day Smoker -- 0.08 packs/day for 10 years    Types: Cigarettes  . Smokeless tobacco: Never Used  . Alcohol Use: Yes     Comment: 04/17/2012 "< 1 can of beer once or twice/month"  . Drug Use: No  . Sexual Activity: Not Currently   Other Topics Concern  . Not on file   Social History Narrative   CNA at [employer].  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    No family history on file.  REVIEW OF SYSTEMS:  All systems reviewed  Negative to the above problem except as noted above.    PHYSICAL EXAM: Filed Vitals:   09/04/13 0558  BP: 159/95  Pulse: 88  Temp: 97.9 F (36.6 C)  Resp: 18    No intake  or output data in the 24 hours ending 09/04/13 0925  General:  Morbidly obese 43 yo in NAD HEENT: normal Neck: supple. no JVD. Carotids 2+ bilat; no bruits. No lymphadenopathy or thryomegaly appreciated. Cor: PMI nondisplaced. Regular rate & rhythm. No rubs, gallops or murmurs. Lungs: clear Abdomen: soft, nontender, nondistended. No hepatomegaly. No  masses. Good bowel sounds. Extremities: no cyanosis, clubbing, rash, edema Neuro: alert & oriented x 3, cranial nerves grossly intact. moves all 4 extremities w/o difficulty.  ECG:  SR 92  LVH with repolarization abnormality.    Results for orders placed during the hospital encounter of 09/03/13 (from the past 24 hour(s))  CBC     Status: Abnormal   Collection Time    09/03/13  2:29 PM      Result Value Range   WBC 9.3  4.0 - 10.5 K/uL   RBC 5.33 (*) 3.87 - 5.11 MIL/uL   Hemoglobin 12.9  12.0 - 15.0 g/dL   HCT 39.1  36.0 - 46.0 %   MCV 73.4 (*) 78.0 - 100.0 fL   MCH 24.2 (*) 26.0 - 34.0 pg   MCHC 33.0  30.0 - 36.0 g/dL   RDW 16.7 (*) 11.5 - 15.5 %   Platelets 296  150 - 400  K/uL  PRO B NATRIURETIC PEPTIDE     Status: Abnormal   Collection Time    09/03/13  2:29 PM      Result Value Range   Pro B Natriuretic peptide (BNP) 168.8 (*) 0 - 125 pg/mL  COMPREHENSIVE METABOLIC PANEL     Status: Abnormal   Collection Time    09/03/13  2:29 PM      Result Value Range   Sodium 139  137 - 147 mEq/L   Potassium 3.8  3.7 - 5.3 mEq/L   Chloride 95 (*) 96 - 112 mEq/L   CO2 27  19 - 32 mEq/L   Glucose, Bld 293 (*) 70 - 99 mg/dL   BUN 15  6 - 23 mg/dL   Creatinine, Ser 0.82  0.50 - 1.10 mg/dL   Calcium 9.7  8.4 - 10.5 mg/dL   Total Protein 8.1  6.0 - 8.3 g/dL   Albumin 3.3 (*) 3.5 - 5.2 g/dL   AST 12  0 - 37 U/L   ALT 12  0 - 35 U/L   Alkaline Phosphatase 91  39 - 117 U/L   Total Bilirubin 0.2 (*) 0.3 - 1.2 mg/dL   GFR calc non Af Amer 88 (*) >90 mL/min   GFR calc Af Amer >90  >90 mL/min  URINALYSIS, ROUTINE W REFLEX MICROSCOPIC     Status: Abnormal   Collection Time    09/03/13  3:56 PM      Result Value Range   Color, Urine YELLOW  YELLOW   APPearance CLEAR  CLEAR   Specific Gravity, Urine 1.020  1.005 - 1.030   pH 7.0  5.0 - 8.0   Glucose, UA >1000 (*) NEGATIVE mg/dL   Hgb urine dipstick NEGATIVE  NEGATIVE   Bilirubin Urine NEGATIVE  NEGATIVE   Ketones, ur NEGATIVE  NEGATIVE mg/dL   Protein, ur 30 (*) NEGATIVE mg/dL   Urobilinogen, UA 0.2  0.0 - 1.0 mg/dL   Nitrite NEGATIVE  NEGATIVE   Leukocytes, UA NEGATIVE  NEGATIVE  URINE RAPID DRUG SCREEN (HOSP PERFORMED)     Status: None   Collection Time    09/03/13  3:56 PM      Result Value Range  Opiates NONE DETECTED  NONE DETECTED   Cocaine NONE DETECTED  NONE DETECTED   Benzodiazepines NONE DETECTED  NONE DETECTED   Amphetamines NONE DETECTED  NONE DETECTED   Tetrahydrocannabinol NONE DETECTED  NONE DETECTED   Barbiturates NONE DETECTED  NONE DETECTED  URINE MICROSCOPIC-ADD ON     Status: None   Collection Time    09/03/13  3:56 PM      Result Value Range   Squamous Epithelial / LPF RARE  RARE    WBC, UA 0-2  <3 WBC/hpf   Bacteria, UA RARE  RARE  POCT I-STAT TROPONIN I     Status: None   Collection Time    09/03/13  4:52 PM      Result Value Range   Troponin i, poc 0.01  0.00 - 0.08 ng/mL   Comment 3           POCT I-STAT TROPONIN I     Status: None   Collection Time    09/03/13  5:06 PM      Result Value Range   Troponin i, poc 0.00  0.00 - 0.08 ng/mL   Comment 3           GLUCOSE, CAPILLARY     Status: Abnormal   Collection Time    09/03/13  9:07 PM      Result Value Range   Glucose-Capillary 252 (*) 70 - 99 mg/dL   Comment 1 Notify RN    TROPONIN I     Status: None   Collection Time    09/03/13 10:34 PM      Result Value Range   Troponin I <0.30  <0.30 ng/mL  TROPONIN I     Status: None   Collection Time    09/04/13  5:28 AM      Result Value Range   Troponin I <0.30  <0.30 ng/mL  LIPID PANEL     Status: Abnormal   Collection Time    09/04/13  5:28 AM      Result Value Range   Cholesterol 202 (*) 0 - 200 mg/dL   Triglycerides 236 (*) <150 mg/dL   HDL 48  >39 mg/dL   Total CHOL/HDL Ratio 4.2     VLDL 47 (*) 0 - 40 mg/dL   LDL Cholesterol 107 (*) 0 - 99 mg/dL  GLUCOSE, CAPILLARY     Status: Abnormal   Collection Time    09/04/13  7:57 AM      Result Value Range   Glucose-Capillary 250 (*) 70 - 99 mg/dL   Comment 1 Documented in Chart     Comment 2 Notify RN    CBC     Status: Abnormal   Collection Time    09/04/13  8:25 AM      Result Value Range   WBC 8.7  4.0 - 10.5 K/uL   RBC 5.21 (*) 3.87 - 5.11 MIL/uL   Hemoglobin 12.5  12.0 - 15.0 g/dL   HCT 38.3  36.0 - 46.0 %   MCV 73.5 (*) 78.0 - 100.0 fL   MCH 24.0 (*) 26.0 - 34.0 pg   MCHC 32.6  30.0 - 36.0 g/dL   RDW 16.4 (*) 11.5 - 15.5 %   Platelets 313  150 - 400 K/uL   Dg Chest 2 View  09/03/2013   CLINICAL DATA:  Chest pain and weakness  EXAM: CHEST  2 VIEW  COMPARISON:  05/22/2013  FINDINGS: Left lingula scar versus atelectasis. Cardiomegaly. Normal  vascularity. No pleural effusion or  pneumothorax.  IMPRESSION: Cardiomegaly without decompensation.  Chronic change.   Electronically Signed   By: Maryclare Bean M.D.   On: 09/03/2013 14:44     ASSESSMENT:  1.  CP   Atypical  I do not think itis cardiac.  Probable musculoskeletal  Would not recomm any testing  2.  DM  Needs to take meds  3.  HL  Keep on statin.  Treat aggressively  4.  HTN  Still high  Difficult  With substance use, probably avoid b blocker.  Consider hydralazine though difficult with multiple doses.  Defer to primary service to f/u as outpatient Needs to lose wt.    Will be available as needed  OK to d/c from cardiac standpoint.

## 2013-09-04 NOTE — Discharge Instructions (Signed)
Chest Pain Observation °It is often hard to give a specific diagnosis for the cause of chest pain. Among other possibilities your symptoms might be caused by inadequate oxygen delivery to your heart (angina). Angina that is not treated or evaluated can lead to a heart attack (myocardial infarction) or death. °Blood tests, electrocardiograms, and X-rays may have been done to help determine a possible cause of your chest pain. After evaluation and observation, your health care provider has determined that it is unlikely your pain was caused by an unstable condition that requires hospitalization. However, a full evaluation of your pain may need to be completed, with additional diagnostic testing as directed. It is very important to keep your follow-up appointments. Not keeping your follow-up appointments could result in permanent heart damage, disability, or death. If there is any problem keeping your follow-up appointments, you must call your health care provider. °HOME CARE INSTRUCTIONS  °Due to the slight chance that your pain could be angina, it is important to follow your health care provider's treatment plan and also maintain a healthy lifestyle: °· Maintain or work toward achieving a healthy weight. °· Stay physically active and exercise regularly. °· Decrease your salt intake. °· Eat a balanced, healthy diet. Talk to a dietician to learn about heart healthy foods. °· Increase your fiber intake by including whole grains, vegetables, fruits, and nuts in your diet. °· Avoid situations that cause stress, anger, or depression. °· Take medicines as advised by your health care provider. Report any side effects to your health care provider. Do not stop medicines or adjust the dosages on your own. °· Quit smoking. Do not use nicotine patches or gum until you check with your health care provider. °· Keep your blood pressure, blood sugar, and cholesterol levels within normal limits. °· Limit alcohol intake to no more than  1 drink per day for women that are not pregnant and 2 drinks per day for men. °· Do not abuse drugs. °SEEK IMMEDIATE MEDICAL CARE IF: °You have severe chest pain or pressure which may include symptoms such as: °· You feel pain or pressure in you arms, neck, jaw, or back. °· You have severe back or abdominal pain, feel sick to your stomach (nauseous), or throw up (vomit). °· You are sweating profusely. °· You are having a fast or irregular heartbeat. °· You feel short of breath while at rest. °· You notice increasing shortness of breath during rest, sleep, or with activity. °· You have chest pain that does not get better after rest or after taking your usual medicine. °· You wake from sleep with chest pain. °· You are unable to sleep because you cannot breathe. °· You develop a frequent cough or you are coughing up blood. °· You feel dizzy, faint, or experience extreme fatigue. °· You develop severe weakness, dizziness, fainting, or chills. °Any of these symptoms may represent a serious problem that is an emergency. Do not wait to see if the symptoms will go away. Call your local emergency services (911 in the [place] Do not drive yourself to the hospital. °MAKE SURE YOU: °· Understand these instructions. °· Will watch your condition. °· Will get help right away if you are not doing well or get worse. °Document Released: 09/16/2010 Document Revised: 04/16/2013 Document Reviewed: 02/13/2013 °ExitCare® Patient Information ©2014 [employer]. ° °

## 2013-09-07 NOTE — ED Provider Notes (Signed)
Medical screening examination/treatment/procedure(s) were performed by non-physician practitioner and as supervising physician I was immediately available for consultation/collaboration.  EKG Interpretation    Date/Time:  Wednesday September 03 2013 14:24:55 EST Ventricular Rate:  92 PR Interval:  190 QRS Duration: 84 QT Interval:  366 QTC Calculation: 452 R Axis:   -26 Text Interpretation:  Normal sinus rhythm Possible Left atrial enlargement Left ventricular hypertrophy Cannot rule out Septal infarct , age undetermined T wave abnormality, consider lateral ischemia Abnormal ECG ED PHYSICIAN INTERPRETATION AVAILABLE IN [facility] Confirmed by TEST, RECORD ([id number]) on 09/05/2013 11:48:47 AM              Mervin Kung, MD 09/07/13 1955

## 2013-09-08 DIAGNOSIS — E7800 Pure hypercholesterolemia, unspecified: Secondary | ICD-10-CM | POA: Insufficient documentation

## 2013-09-08 DIAGNOSIS — M545 Low back pain, unspecified: Secondary | ICD-10-CM | POA: Insufficient documentation

## 2013-09-08 DIAGNOSIS — Z87891 Personal history of nicotine dependence: Secondary | ICD-10-CM | POA: Insufficient documentation

## 2013-09-08 DIAGNOSIS — E119 Type 2 diabetes mellitus without complications: Secondary | ICD-10-CM | POA: Insufficient documentation

## 2013-09-08 DIAGNOSIS — G8929 Other chronic pain: Secondary | ICD-10-CM | POA: Insufficient documentation

## 2013-09-08 DIAGNOSIS — Z7982 Long term (current) use of aspirin: Secondary | ICD-10-CM | POA: Insufficient documentation

## 2013-09-08 DIAGNOSIS — Z8679 Personal history of other diseases of the circulatory system: Secondary | ICD-10-CM | POA: Insufficient documentation

## 2013-09-08 DIAGNOSIS — E669 Obesity, unspecified: Secondary | ICD-10-CM | POA: Insufficient documentation

## 2013-09-08 DIAGNOSIS — Z9889 Other specified postprocedural states: Secondary | ICD-10-CM | POA: Insufficient documentation

## 2013-09-08 DIAGNOSIS — Z8659 Personal history of other mental and behavioral disorders: Secondary | ICD-10-CM | POA: Insufficient documentation

## 2013-09-08 DIAGNOSIS — Z79899 Other long term (current) drug therapy: Secondary | ICD-10-CM | POA: Insufficient documentation

## 2013-09-08 DIAGNOSIS — I10 Essential (primary) hypertension: Secondary | ICD-10-CM | POA: Insufficient documentation

## 2013-09-09 ENCOUNTER — Encounter (HOSPITAL_COMMUNITY): Payer: Self-pay | Admitting: Emergency Medicine

## 2013-09-09 ENCOUNTER — Emergency Department (HOSPITAL_COMMUNITY)
Admission: EM | Admit: 2013-09-09 | Discharge: 2013-09-09 | Disposition: A | Discharge status: Home / Self Care | Payer: Self-pay | Attending: Emergency Medicine | Admitting: Emergency Medicine

## 2013-09-09 DIAGNOSIS — M545 Low back pain, unspecified: Secondary | ICD-10-CM

## 2013-09-09 HISTORY — DX: Obesity, unspecified: E66.9

## 2013-09-09 MED ORDER — OXYCODONE-ACETAMINOPHEN 5-325 MG PO TABS: 1.0000 | ORAL_TABLET | Freq: Four times a day (QID) | ORAL | Status: DC | PRN

## 2013-09-09 MED ORDER — OXYCODONE-ACETAMINOPHEN 5-325 MG PO TABS
2.0000 | ORAL_TABLET | Freq: Once | ORAL | Status: AC
  Administered 2013-09-09: 2 via ORAL
  Filled 2013-09-09: qty 2

## 2013-09-09 MED ORDER — KETOROLAC TROMETHAMINE 60 MG/2ML IM SOLN
60.0000 mg | Freq: Once | INTRAMUSCULAR | Status: AC
  Administered 2013-09-09: 60 mg via INTRAMUSCULAR
  Filled 2013-09-09: qty 2

## 2013-09-09 MED ORDER — DIAZEPAM 5 MG PO TABS
5.0000 mg | ORAL_TABLET | Freq: Once | ORAL | Status: AC
  Administered 2013-09-09: 5 mg via ORAL
  Filled 2013-09-09: qty 1

## 2013-09-09 NOTE — ED Provider Notes (Signed)
Medical screening examination/treatment/procedure(s) were performed by non-physician practitioner and as supervising physician I was immediately available for consultation/collaboration.  EKG Interpretation   None         Wynetta Fines, MD 09/09/13 [id number]

## 2013-09-09 NOTE — ED Provider Notes (Signed)
CSN: [id number]     Arrival date & time 09/08/13  2342 History   First MD Initiated Contact with Patient 09/09/13 0032     Chief Complaint  Patient presents with  . Back Pain   (Consider location/radiation/quality/duration/timing/severity/associated sxs/prior Treatment) HPI Comments: 43 year old female with a history of chronic low back pain presents today for worsening left-sided low back pain. Patient denies any trauma or injury. She states the pain is sharp in nature and worse with movement, ambulation, and certain positions. Patient states that pain radiates down her left leg. She has taken Norco, ibuprofen, and Flexeril for her symptoms without relief. She states she has follow up with her primary care provider on Thursday. She denies associated fever, night sweats, bowel or bladder incontinence, genital and perianal numbness, and saddle anesthesia. Patient further denies history of cancer or IV drug use as well as urinary symptoms or vaginal complaints.  Patient is a 43 y.o. female presenting with back pain. The history is provided by the patient. No language interpreter was used.  Back Pain Associated symptoms: no dysuria, no fever, no numbness and no weakness     Past Medical History  Diagnosis Date  . Hypertension   . Type II diabetes mellitus   . Anginal pain   . High cholesterol   . Shortness of breath 04/17/2012    "all the time"  . Headache(784.0) 04/17/2012    "~ qod; lately waking up in am w/one"  . Migraines 04/17/2012  . Chronic lower back pain 04/17/2012    "just got over some; catched when I walked"  . Depression   . Anxiety   . Sleep apnea   . Obesity    Past Surgical History  Procedure Laterality Date  . Cardiac catheterization  ~ 2011   No family history on file. History  Substance Use Topics  . Smoking status: Former Smoker -- 0.08 packs/day for 10 years    Types: Cigarettes    Quit date: 08/04/2013  . Smokeless tobacco: Never Used  . Alcohol Use: Yes     Comment: 04/17/2012 "< 1 can of beer once or twice/month"   OB History   Grav Para Term Preterm Abortions TAB SAB Ect Mult Living                 Review of Systems  Constitutional: Negative for fever.  Genitourinary: Negative for dysuria, hematuria, vaginal bleeding and vaginal discharge.  Musculoskeletal: Positive for back pain.  Neurological: Negative for weakness and numbness.  All other systems reviewed and are negative.    Allergies  Morphine and related  Home Medications   Current Outpatient Rx  Name  Route  Sig  Dispense  Refill  . aspirin EC 81 MG EC tablet   Oral   Take 1 tablet (81 mg total) by mouth daily.   30 tablet   0   . amLODipine (NORVASC) 10 MG tablet   Oral   Take 1 tablet (10 mg total) by mouth daily. For hypertension.   90 tablet   1   . furosemide (LASIX) 20 MG tablet   Oral   Take 1 tablet (20 mg total) by mouth daily.   90 tablet   1   . lisinopril (PRINIVIL,ZESTRIL) 20 MG tablet   Oral   Take 1 tablet (20 mg total) by mouth daily. For hypertension.   90 tablet   1   . metFORMIN (GLUCOPHAGE) 1000 MG tablet   Oral   Take 1 tablet (1,000  mg total) by mouth 2 (two) times daily with a meal.   180 tablet   1   . oxyCODONE-acetaminophen (PERCOCET/ROXICET) 5-325 MG per tablet   Oral   Take 1-2 tablets by mouth every 6 (six) hours as needed for severe pain.   11 tablet   0   . pravastatin (PRAVACHOL) 40 MG tablet   Oral   Take 1 tablet (40 mg total) by mouth daily.   30 tablet   0    BP 153/100  Pulse 95  Temp(Src) 97.9 F (36.6 C) (Oral)  Resp 14  SpO2 99%  LMP 08/25/2013  Physical Exam  Nursing note and vitals reviewed. Constitutional: She is oriented to person, place, and time. She appears well-developed and well-nourished. No distress.  HENT:  Head: Normocephalic and atraumatic.  Eyes: Conjunctivae and EOM are normal. No scleral icterus.  Neck: Normal range of motion.  Cardiovascular: Normal rate, regular rhythm  and intact distal pulses.   Pulses:      Dorsalis pedis pulses are 2+ on the right side, and 2+ on the left side.       Posterior tibial pulses are 2+ on the right side, and 2+ on the left side.  Pulmonary/Chest: Effort normal. No respiratory distress.  Musculoskeletal:       Left hip: Normal.       Cervical back: Normal.       Thoracic back: Normal.       Lumbar back: She exhibits decreased range of motion (Secondary to pain) and tenderness. She exhibits no bony tenderness, no swelling, no deformity, no pain and no spasm.       Back:  Neurological: She is alert and oriented to person, place, and time. She has normal reflexes. No sensory deficit. GCS eye subscore is 4. GCS verbal subscore is 5. GCS motor subscore is 6.  Reflex Scores:      Patellar reflexes are 2+ on the right side and 2+ on the left side.      Achilles reflexes are 2+ on the right side and 2+ on the left side. No gross sensory deficits appreciated. Patient ambulatory and weightbearing with mildly antalgic gait.  Skin: Skin is warm and dry. No rash noted. She is not diaphoretic. No erythema. No pallor.  Psychiatric: She has a normal mood and affect. Her behavior is normal.    ED Course  Procedures (including critical care time) Labs Review Labs Reviewed - No data to display  Imaging Review No results found.  EKG Interpretation   None       MDM   1. Low back pain    Uncomplicated back pain. Patient well and nontoxic appearing, hemodynamically stable, and afebrile. She is neurovascularly intact and ambulatory in the ED. No red flags for signs concerning for cauda equina. No history of trauma or injury since last seen. Patient denies a history of cancer or IV drug use. She endorses primary care followup in 2 days for further evaluation of symptoms. Symptoms treated with Toradol, Valium, and Percocet. Patient stable for discharge with short course of Percocet for pain control. Return precautions provided and  patient agreeable to plan with no unaddressed concerns.   Filed Vitals:   09/09/13 0006  BP: 153/100  Pulse: 95  Temp: 97.9 F (36.6 C)  TempSrc: Oral  Resp: 14  SpO2: 99%       Antonietta Breach, PA-C 09/09/13 [phone]

## 2013-09-09 NOTE — ED Notes (Signed)
Pt. reports low back muscle pain onset last week worse with movement and certain positions , denies injury or fall / no urinary discomfort .

## 2013-09-09 NOTE — Discharge Instructions (Signed)
Back Pain, Adult Low back pain is very common. About 1 in 5 people have back pain.The cause of low back pain is rarely dangerous. The pain often gets better over time.About half of people with a sudden onset of back pain feel better in just 2 weeks. About 8 in 10 people feel better by 6 weeks.  CAUSES Some common causes of back pain include:  Strain of the muscles or ligaments supporting the spine.  Wear and tear (degeneration) of the spinal discs.  Arthritis.  Direct injury to the back. DIAGNOSIS Most of the time, the direct cause of low back pain is not known.However, back pain can be treated effectively even when the exact cause of the pain is unknown.Answering your caregiver's questions about your overall health and symptoms is one of the most accurate ways to make sure the cause of your pain is not dangerous. If your caregiver needs more information, he or she may order lab work or imaging tests (X-rays or MRIs).However, even if imaging tests show changes in your back, this usually does not require surgery. HOME CARE INSTRUCTIONS For many people, back pain returns.Since low back pain is rarely dangerous, it is often a condition that people can learn to manageon their own.   Remain active. It is stressful on the back to sit or stand in one place. Do not sit, drive, or stand in one place for more than 30 minutes at a time. Take short walks on level surfaces as soon as pain allows.Try to increase the length of time you walk each day.  Do not stay in bed.Resting more than 1 or 2 days can delay your recovery.  Do not avoid exercise or work.Your body is made to move.It is not dangerous to be active, even though your back may hurt.Your back will likely heal faster if you return to being active before your pain is gone.  Pay attention to your body when you bend and lift. Many people have less discomfortwhen lifting if they bend their knees, keep the load close to their bodies,and  avoid twisting. Often, the most comfortable positions are those that put less stress on your recovering back.  Find a comfortable position to sleep. Use a firm mattress and lie on your side with your knees slightly bent. If you lie on your back, put a pillow under your knees.  Only take over-the-counter or prescription medicines as directed by your caregiver. Over-the-counter medicines to reduce pain and inflammation are often the most helpful.Your caregiver may prescribe muscle relaxant drugs.These medicines help dull your pain so you can more quickly return to your normal activities and healthy exercise.  Put ice on the injured area.  Put ice in a plastic bag.  Place a towel between your skin and the bag.  Leave the ice on for 15-20 minutes, 03-04 times a day for the first 2 to 3 days. After that, ice and heat may be alternated to reduce pain and spasms.  Ask your caregiver about trying back exercises and gentle massage. This may be of some benefit.  Avoid feeling anxious or stressed.Stress increases muscle tension and can worsen back pain.It is important to recognize when you are anxious or stressed and learn ways to manage it.Exercise is a great option. SEEK MEDICAL CARE IF:  You have pain that is not relieved with rest or medicine.  You have pain that does not improve in 1 week.  You have new symptoms.  You are generally not feeling well. SEEK   IMMEDIATE MEDICAL CARE IF:   You have pain that radiates from your back into your legs.  You develop new bowel or bladder control problems.  You have unusual weakness or numbness in your arms or legs.  You develop nausea or vomiting.  You develop abdominal pain.  You feel faint. Document Released: 08/14/2005 Document Revised: 02/13/2012 Document Reviewed: 01/02/2011 [employer] Patient Information 2014 [employer].  

## 2013-09-11 ENCOUNTER — Encounter: Payer: Self-pay | Admitting: Sports Medicine

## 2013-09-11 ENCOUNTER — Ambulatory Visit (INDEPENDENT_AMBULATORY_CARE_PROVIDER_SITE_OTHER): Payer: Self-pay | Admitting: Sports Medicine

## 2013-09-11 ENCOUNTER — Telehealth: Payer: Self-pay | Admitting: Sports Medicine

## 2013-09-11 VITALS — BP 190/117 | HR 85 | Temp 98.4°F | Ht 61.0 in | Wt 253.0 lb

## 2013-09-11 DIAGNOSIS — R079 Chest pain, unspecified: Secondary | ICD-10-CM

## 2013-09-11 DIAGNOSIS — E1165 Type 2 diabetes mellitus with hyperglycemia: Secondary | ICD-10-CM

## 2013-09-11 DIAGNOSIS — IMO0002 Reserved for concepts with insufficient information to code with codable children: Secondary | ICD-10-CM

## 2013-09-11 DIAGNOSIS — M549 Dorsalgia, unspecified: Secondary | ICD-10-CM | POA: Insufficient documentation

## 2013-09-11 DIAGNOSIS — IMO0001 Reserved for inherently not codable concepts without codable children: Secondary | ICD-10-CM

## 2013-09-11 DIAGNOSIS — I10 Essential (primary) hypertension: Secondary | ICD-10-CM

## 2013-09-11 MED ORDER — KETOROLAC TROMETHAMINE 30 MG/ML IJ SOLN
30.0000 mg | Freq: Once | INTRAMUSCULAR | Status: AC
  Administered 2013-09-11: 30 mg via INTRAMUSCULAR

## 2013-09-11 MED ORDER — LISINOPRIL-HYDROCHLOROTHIAZIDE 20-12.5 MG PO TABS: 1.0000 | ORAL_TABLET | Freq: Every day | ORAL | Status: DC

## 2013-09-11 MED ORDER — LOVASTATIN 40 MG PO TABS: 40.0000 mg | ORAL_TABLET | Freq: Every day | ORAL | Status: DC

## 2013-09-11 MED ORDER — AMLODIPINE BESYLATE 10 MG PO TABS: 10.0000 mg | ORAL_TABLET | Freq: Every day | ORAL | Status: DC

## 2013-09-11 MED ORDER — GLIPIZIDE 10 MG PO TABS: 10.0000 mg | ORAL_TABLET | Freq: Two times a day (BID) | ORAL | Status: DC

## 2013-09-11 MED ORDER — METFORMIN HCL 1000 MG PO TABS: 1000.0000 mg | ORAL_TABLET | Freq: Two times a day (BID) | ORAL | Status: DC

## 2013-09-11 MED ORDER — CYCLOBENZAPRINE HCL 10 MG PO TABS
10.0000 mg | ORAL_TABLET | Freq: Three times a day (TID) | ORAL | Status: DC | PRN
Start: 2013-09-11 — End: 2013-12-23

## 2013-09-11 MED ORDER — CYCLOBENZAPRINE HCL 10 MG PO TABS: 10.0000 mg | ORAL_TABLET | Freq: Three times a day (TID) | ORAL | Status: DC | PRN

## 2013-09-11 NOTE — Assessment & Plan Note (Addendum)
Needs to start meds.  Rx provided previously.   Patient provided with funds to go to fill her critical medications today and coordinated with her pharmacy to make sure this happened.   No new symptoms of new end organ damage.

## 2013-09-11 NOTE — Assessment & Plan Note (Addendum)
START MEDS May need insulin but cannot afford at this time.  No evidence/hx of DKA.

## 2013-09-11 NOTE — Telephone Encounter (Signed)
I have resent the rx to the requested pharmacy. I called the number below that is left as her cell phone and that is not her number

## 2013-09-11 NOTE — Assessment & Plan Note (Signed)
No further chest pain.  Will once again request records from [facility] regarding hospitalization in July

## 2013-09-11 NOTE — Telephone Encounter (Signed)
Pt states that prescription should have been sent to [facility] on [address] instead of [facility] on [address]. Cell [phone]

## 2013-09-11 NOTE — Patient Instructions (Addendum)
   Start your MEDS ASAP.  Take immediately  Follow up with Pamala Hurry for the [place]  Follow up in 2 weeks for Nurse visit to check your BP  Start the Back Exercises I showed you.  Flexeril for muscle relaxer Rx.   YOUR MEDICATIONS ARE AT [facility] number is [phone].  Ask for [facility]    If you need anything prior to your next visit please call the clinic. Please Bring all medications or accurate medication list with you to each appointment; an accurate medication list is essential in providing you the best care possible.

## 2013-09-11 NOTE — Progress Notes (Signed)
Mikaela Hilgeman - 43 y.o. female MRN [id number]  Date of birth: [birth year]  CC, HPI, Interval History & ROS  Soriyah is here today to followup on her chronic medical conditions including:  Hospital Follow up & ED Follow up   She reports not picking up her medications yet due to financial constraints.  But gets pain tomorrow implants pick him up then.  Pt denies chest pain, dyspnea at rest or exertion, PND, lower extremity edema.  Patient denies any new facial asymmetry, unilateral weakness, or dysarthria.  Pt denies hypoglycemic symptoms/episodes.  No reported polyuria/polydipsia. Pt is compliant with foot exams and denies any new foot lesions or new sensory changes/dysesthesias.  Acute issue for back pain: Seen once in the emergency department. Pt denies any radicular symptoms, change in bowel or bladder habits, muscle weakness or falls associated with back pain.  No fevers, chills, night sweats or weight loss.  Will further evaluated followup visits but emphasis was placed on other chronic medical conditions  Pertinent History & Care Coordination  Serenity's major active medical problems include: # HTN: Severe range, non compliant with medication regimen # Diabetes, HLD:  Poorly controlled,  # Tobacco Abuse & Polysubstance abuse:  Hx of cocaine use  Other Pertinent Med/Surg/Hosp History: #  Hospitalized in [facility] 8/14 for - reported mini stroke and diabetes  Other Care Providers include:  CARDIOLOGY  -   Follow up Issues:  SOCIAL SITUATION:   Homeless Medication acquisition & compliance   History  Smoking status  . Former Smoker -- 0.08 packs/day for 10 years  . Types: Cigarettes  . Quit date: 08/04/2013  Smokeless tobacco  . Never Used   Health Maintenance Due  Topic  . Foot Exam   . Urine Microalbumin   . Pap Smear   . Tetanus/tdap     Recent Labs  05/23/13 0015 05/23/13 0430 08/13/13 0920 09/04/13 0528  HGBA1C 10.2*  --  10.0  --   TRIG  --  173*  --  236*    CHOL  --  184  --  202*  HDL  --  42  --  48  LDLCALC  --  107*  --  107*  TSH 1.298  --   --  0.498     Otherwise past Medical, Surgical, Social, and Family History Reviewed per EMR Medications and Allergies reviewed and all updated if necessary. Objective Findings  VITALS: HR: 85 bpm  BP: 190/117 mmHg  TEMP: 98.4 F (36.9 C) (Oral)  RESP:    HT: 5\' 1"  (154.9 cm)  WT: 253 lb (114.76 kg)  BMI: 47.9   BP Readings from Last 3 Encounters:  09/11/13 190/117  09/09/13 153/86  09/04/13 159/95   Wt Readings from Last 3 Encounters:  09/11/13 253 lb (114.76 kg)  09/03/13 251 lb (113.853 kg)  08/13/13 253 lb (114.76 kg)     PHYSICAL EXAM: GENERAL:  adult obese Afro-American female. In no discomfort; no respiratory distress  PSYCH: alert and appropriate, good insight   HNEENT:   CARDIO: RRR, S1/S2 heard, no murmur  LUNGS: CTA B, no wheezes, no crackles  ABDOMEN:   EXTREM:    GU:   SKIN:      Assessment & Plan   Problems addressed today: General Plan & Pt Instructions:  No diagnosis found.    Start your MEDS ASAP.  Take immediately  Follow up with Pamala Hurry for the [place]  Follow up in 2 weeks for Nurse visit to check your  BP  Start the Back Exercises I showed you.  Flexeril for muscle relaxer Rx.     For further discussion of A/P and for follow up issues see problem based charting.

## 2013-09-11 NOTE — Assessment & Plan Note (Addendum)
No red flags.  Mechanical in nature.  Exercises reviewed. Toradol X 30 in clinic X 1.  Then flexeril prn

## 2013-11-03 ENCOUNTER — Ambulatory Visit: Payer: Self-pay | Admitting: Sports Medicine

## 2013-11-04 ENCOUNTER — Emergency Department (HOSPITAL_COMMUNITY)
Admission: EM | Admit: 2013-11-04 | Discharge: 2013-11-04 | Discharge status: Against Medical Advice | Payer: Self-pay | Attending: Emergency Medicine | Admitting: Emergency Medicine

## 2013-11-04 ENCOUNTER — Encounter (HOSPITAL_COMMUNITY): Payer: Self-pay | Admitting: Emergency Medicine

## 2013-11-04 DIAGNOSIS — G8929 Other chronic pain: Secondary | ICD-10-CM | POA: Insufficient documentation

## 2013-11-04 DIAGNOSIS — I209 Angina pectoris, unspecified: Secondary | ICD-10-CM | POA: Insufficient documentation

## 2013-11-04 DIAGNOSIS — H5710 Ocular pain, unspecified eye: Secondary | ICD-10-CM | POA: Insufficient documentation

## 2013-11-04 DIAGNOSIS — E119 Type 2 diabetes mellitus without complications: Secondary | ICD-10-CM | POA: Insufficient documentation

## 2013-11-04 DIAGNOSIS — R51 Headache: Secondary | ICD-10-CM | POA: Insufficient documentation

## 2013-11-04 DIAGNOSIS — I10 Essential (primary) hypertension: Secondary | ICD-10-CM | POA: Insufficient documentation

## 2013-11-04 NOTE — ED Notes (Signed)
Pt reports 2 days of headache and left eye pain. Conjuctiva with red appearance.  Pt reports light sensitivity. Denies nausea/vomiting, fever.

## 2013-11-04 NOTE — ED Notes (Signed)
PT called for room x4 without response

## 2013-11-05 ENCOUNTER — Encounter (HOSPITAL_COMMUNITY): Payer: Self-pay | Admitting: Emergency Medicine

## 2013-11-05 ENCOUNTER — Emergency Department (HOSPITAL_COMMUNITY)
Admission: EM | Admit: 2013-11-05 | Discharge: 2013-11-05 | Disposition: A | Discharge status: Home / Self Care | Payer: Self-pay | Attending: Emergency Medicine | Admitting: Emergency Medicine

## 2013-11-05 DIAGNOSIS — I10 Essential (primary) hypertension: Secondary | ICD-10-CM | POA: Insufficient documentation

## 2013-11-05 DIAGNOSIS — F3289 Other specified depressive episodes: Secondary | ICD-10-CM | POA: Insufficient documentation

## 2013-11-05 DIAGNOSIS — Z87891 Personal history of nicotine dependence: Secondary | ICD-10-CM | POA: Insufficient documentation

## 2013-11-05 DIAGNOSIS — E119 Type 2 diabetes mellitus without complications: Secondary | ICD-10-CM | POA: Insufficient documentation

## 2013-11-05 DIAGNOSIS — Z7982 Long term (current) use of aspirin: Secondary | ICD-10-CM | POA: Insufficient documentation

## 2013-11-05 DIAGNOSIS — I209 Angina pectoris, unspecified: Secondary | ICD-10-CM | POA: Insufficient documentation

## 2013-11-05 DIAGNOSIS — M545 Low back pain, unspecified: Secondary | ICD-10-CM | POA: Insufficient documentation

## 2013-11-05 DIAGNOSIS — R51 Headache: Secondary | ICD-10-CM | POA: Insufficient documentation

## 2013-11-05 DIAGNOSIS — F411 Generalized anxiety disorder: Secondary | ICD-10-CM | POA: Insufficient documentation

## 2013-11-05 DIAGNOSIS — R519 Headache, unspecified: Secondary | ICD-10-CM

## 2013-11-05 DIAGNOSIS — F329 Major depressive disorder, single episode, unspecified: Secondary | ICD-10-CM | POA: Insufficient documentation

## 2013-11-05 DIAGNOSIS — E7800 Pure hypercholesterolemia, unspecified: Secondary | ICD-10-CM | POA: Insufficient documentation

## 2013-11-05 DIAGNOSIS — G4730 Sleep apnea, unspecified: Secondary | ICD-10-CM | POA: Insufficient documentation

## 2013-11-05 DIAGNOSIS — Z79899 Other long term (current) drug therapy: Secondary | ICD-10-CM | POA: Insufficient documentation

## 2013-11-05 DIAGNOSIS — G8929 Other chronic pain: Secondary | ICD-10-CM | POA: Insufficient documentation

## 2013-11-05 DIAGNOSIS — G43909 Migraine, unspecified, not intractable, without status migrainosus: Secondary | ICD-10-CM | POA: Insufficient documentation

## 2013-11-05 DIAGNOSIS — H109 Unspecified conjunctivitis: Secondary | ICD-10-CM | POA: Insufficient documentation

## 2013-11-05 DIAGNOSIS — Z9889 Other specified postprocedural states: Secondary | ICD-10-CM | POA: Insufficient documentation

## 2013-11-05 LAB — CBG MONITORING, ED: GLUCOSE-CAPILLARY: 241 mg/dL — AB (ref 70–99)

## 2013-11-05 MED ORDER — KETOROLAC TROMETHAMINE 30 MG/ML IJ SOLN
30.0000 mg | Freq: Once | INTRAMUSCULAR | Status: AC
  Administered 2013-11-05: 30 mg via INTRAVENOUS
  Filled 2013-11-05: qty 1

## 2013-11-05 MED ORDER — POLYMYXIN B-TRIMETHOPRIM 10000-0.1 UNIT/ML-% OP SOLN: 1.0000 [drp] | OPHTHALMIC | Status: DC

## 2013-11-05 MED ORDER — SODIUM CHLORIDE 0.9 % IV BOLUS (SEPSIS)
1000.0000 mL | Freq: Once | INTRAVENOUS | Status: AC
  Administered 2013-11-05: 1000 mL via INTRAVENOUS

## 2013-11-05 MED ORDER — METOCLOPRAMIDE HCL 5 MG/ML IJ SOLN
10.0000 mg | Freq: Once | INTRAMUSCULAR | Status: AC
  Administered 2013-11-05: 10 mg via INTRAVENOUS
  Filled 2013-11-05: qty 2

## 2013-11-05 MED ORDER — FLUORESCEIN SODIUM 1 MG OP STRP
1.0000 | ORAL_STRIP | Freq: Once | OPHTHALMIC | Status: AC
  Administered 2013-11-05: 1 via OPHTHALMIC
  Filled 2013-11-05: qty 1

## 2013-11-05 MED ORDER — TETRACAINE HCL 0.5 % OP SOLN
1.0000 [drp] | Freq: Once | OPHTHALMIC | Status: AC
  Administered 2013-11-05: 1 [drp] via OPHTHALMIC
  Filled 2013-11-05: qty 2

## 2013-11-05 MED ORDER — DIPHENHYDRAMINE HCL 50 MG/ML IJ SOLN
25.0000 mg | Freq: Once | INTRAMUSCULAR | Status: AC
  Administered 2013-11-05: 25 mg via INTRAVENOUS
  Filled 2013-11-05: qty 1

## 2013-11-05 NOTE — ED Notes (Signed)
Pt presents from home with c/o of headache and Left eye pain x 3 days.  Pt left eye is red, sensitive to light with clear drainage.  Pt states the headache is anterior in location.

## 2013-11-05 NOTE — Discharge Instructions (Signed)
Conjunctivitis Conjunctivitis is commonly called "pink eye." Conjunctivitis can be caused by bacterial or viral infection, allergies, or injuries. There is usually redness of the lining of the eye, itching, discomfort, and sometimes discharge. There may be deposits of matter along the eyelids. A viral infection usually causes a watery discharge, while a bacterial infection causes a yellowish, thick discharge. Pink eye is very contagious and spreads by direct contact. You may be given antibiotic eyedrops as part of your treatment. Before using your eye medicine, remove all drainage from the eye by washing gently with warm water and cotton balls. Continue to use the medication until you have awakened 2 mornings in a row without discharge from the eye. Do not rub your eye. This increases the irritation and helps spread infection. Use separate towels from other household members. Wash your hands with soap and water before and after touching your eyes. Use cold compresses to reduce pain and sunglasses to relieve irritation from light. Do not wear contact lenses or wear eye makeup until the infection is gone. SEEK MEDICAL CARE IF:   Your symptoms are not better after 3 days of treatment.  You have increased pain or trouble seeing.  The outer eyelids become very red or swollen. Document Released: 09/21/2004 Document Revised: 11/06/2011 Document Reviewed: 08/14/2005 [facility] Patient Information 2014 [place]. Headache and Arthritis Headaches and arthritis are common problems. This causes an interest in the possible role of arthritis in causing headaches. Several major forms of arthritis exist. Two of the most common types are:  Rheumatoid arthritis.  Osteoarthritis. Rheumatoid arthritis may begin at any age. It is a condition in which the body attacks some of its own tissues, thinking they do not belong. This leads to destruction of the bony areas around the joints. This condition may afflict any of  the body's joints. It usually produces a deformity of the joint. The hands and fingers no longer appear straight but often appear angled towards one side. In some cases, the spine may be involved. Most often it is the vertebrae of the neck (cervicalspine). The areas of the neck most commonly afflicted by rheumatoid arthritis are the first and second cervical vertebrae. Curiously, rheumatoid arthritis, though it often produces severe deformities, is not always painful.  The more common form of arthritis is osteoarthritis. It is a wear-and-tear form of arthritis. It usually does not produce deformity of the joints or destruction of the bony tissues. Rather the ligaments weaken. They may be calcified due to the body's attempt to heal the damage. The larger joints of the body and those joints that take the most stress and strain are the most often affected. In the neck region this osteoarthritis usually involves the fifth, sixth and seventh vertebrae. This is because the effects of posture produce the most fatigue on them. Osteoarthritis is often more painful than rheumatoid arthritis.  During workups for arthritis, a test evaluating inflammation, (the sedimentation rate) often is performed. In rheumatoid arthritis, this test will usually be elevated. Other tests for inflammation may also be elevated. In patients with osteoarthritis, x-rays of the neck or jaw joints will show changes from "lipping" of the vertebrae. This is caused by calcium deposits in the ligaments. Or they may show narrowing of the space between the vertebrae, or spur formation (from calcium deposits). If severe, it may cause obstruction of the holes where the nerves pass from the spine to the body. In rheumatoid arthritis, dislocation of vertebrae may occur in the upper neck. CT  scan and MRI in patients with osteoarthritis may show bulging of the discs that cushion the vertebrae. In the most severe cases, herniation of the discs may occur.    Headaches, felt as a pain in the neck, may be caused by arthritis if the first, second or third vertebrae are involved. This condition is due to the nerves that supply the scalp only originating from this area of the spine. Neck pain itself, whether alone or coupled with headaches, can involve any portion of the neck. If the jaw is involved, the symptoms are similar to those of Temporomandibular Joint Syndrome (TMJ).  The progressive severity of rheumatoid arthritis may be slowed by a variety of potent medications. In osteoarthritis, its progression is not usually hindered by medication. The following may be helpful in slowing the advancement of the disorder:  Lifestyle adjustment.  Exercise.  Rest.  Weight loss. Medications, such as the nonsteroidal anti-inflammatory agents (NSAIDs), are useful. They may reduce the pain and improve the reduced motion which occurs in joints afflicted by arthritis. From some studies, the use of acetaminophen appears to be as effective in controlling the pain of arthritis as the NSAIDs. Physical modalities may also be useful for arthritis. They include:  Heat.  Massage.  Exercise. But physical therapy must be prescribed by a caregiver, just as most medications for arthritis.  Document Released: 11/04/2003 Document Revised: 11/06/2011 Document Reviewed: 04/02/2008 [facility] Patient Information 2014 [place], [place].

## 2013-11-05 NOTE — Discharge Planning (Signed)
[id number] Felicia E, Community Liaison  Spoke to patient about establishing care with a provider. Patient states she is currently being seen at [facility] and has no concerns. I did encourage patient to apply for the orange card with the practice, application and instructions given.  My contact information was also provided for any future questions or concerns.

## 2013-11-05 NOTE — ED Provider Notes (Signed)
Medical screening examination/treatment/procedure(s) were performed by non-physician practitioner and as supervising physician I was immediately available for consultation/collaboration.  Richarda Blade, MD 11/05/13 1540

## 2013-11-05 NOTE — ED Provider Notes (Signed)
CSN: [id number]     Arrival date & time 11/05/13  7782 History   First MD Initiated Contact with Patient 11/05/13 [phone]     Chief Complaint  Patient presents with  . Eye Pain    Left  . Headache     (Consider location/radiation/quality/duration/timing/severity/associated sxs/prior Treatment) HPI Comments: Patient presents to the ED with a chief complaint of headache.  She states that the headache is associated with left eye pain and redness.  She states that the symptoms started 3 days ago and have gradually worsened.  The pain is improved with tylenol.  She denies fevers, chills, changes in vision, neck stiffness, or ataxia.  She reports associated photophobia and discharge from the eye that is worse in the morning.  She states that she has had headaches before, but never one that has lasted this long.  The history is provided by the patient. No language interpreter was used.    Past Medical History  Diagnosis Date  . Hypertension   . Type II diabetes mellitus   . Anginal pain   . High cholesterol   . Shortness of breath 04/17/2012    "all the time"  . Headache(784.0) 04/17/2012    "~ qod; lately waking up in am w/one"  . Migraines 04/17/2012  . Chronic lower back pain 04/17/2012    "just got over some; catched when I walked"  . Depression   . Anxiety   . Sleep apnea   . Obesity    Past Surgical History  Procedure Laterality Date  . Cardiac catheterization  ~ 2011   History reviewed. No pertinent family history. History  Substance Use Topics  . Smoking status: Former Smoker -- 0.08 packs/day for 10 years    Types: Cigarettes    Quit date: 08/04/2013  . Smokeless tobacco: Never Used  . Alcohol Use: Yes     Comment: rare   OB History   Grav Para Term Preterm Abortions TAB SAB Ect Mult Living                 Review of Systems  All other systems reviewed and are negative.      Allergies  Morphine and related  Home Medications   Current Outpatient Rx  Name   Route  Sig  Dispense  Refill  . amLODipine (NORVASC) 10 MG tablet   Oral   Take 1 tablet (10 mg total) by mouth daily. For hypertension.   30 tablet   1   . aspirin EC 81 MG EC tablet   Oral   Take 1 tablet (81 mg total) by mouth daily.   30 tablet   0   . cyclobenzaprine (FLEXERIL) 10 MG tablet   Oral   Take 1 tablet (10 mg total) by mouth 3 (three) times daily as needed for muscle spasms.   30 tablet   0   . glipiZIDE (GLUCOTROL) 10 MG tablet   Oral   Take 1 tablet (10 mg total) by mouth 2 (two) times daily before a meal.   60 tablet   5   . lisinopril-hydrochlorothiazide (ZESTORETIC) 20-12.5 MG per tablet   Oral   Take 1 tablet by mouth daily.   30 tablet   5   . lovastatin (MEVACOR) 40 MG tablet   Oral   Take 1 tablet (40 mg total) by mouth at bedtime.   30 tablet   5   . metFORMIN (GLUCOPHAGE) 1000 MG tablet   Oral   Take 1  tablet (1,000 mg total) by mouth 2 (two) times daily with a meal.   60 tablet   5    BP 165/97  Pulse 94  Temp(Src) 97.8 F (36.6 C) (Oral)  Resp 16  SpO2 100%  LMP 10/25/2013 Physical Exam  Nursing note and vitals reviewed. Constitutional: She is oriented to person, place, and time. She appears well-developed and well-nourished.  HENT:  Head: Normocephalic and atraumatic.  Right Ear: External ear normal.  Left Ear: External ear normal.  Eyes: EOM are normal. Pupils are equal, round, and reactive to light.  Left conjunctiva is injected, mild amount of discharge  Neck: Normal range of motion. Neck supple.  No pain with neck flexion, no meningismus  Cardiovascular: Normal rate, regular rhythm and normal heart sounds.  Exam reveals no gallop and no friction rub.   No murmur heard. Pulmonary/Chest: Effort normal and breath sounds normal. No respiratory distress. She has no wheezes. She has no rales. She exhibits no tenderness.  Abdominal: Soft. She exhibits no distension and no mass. There is no tenderness. There is no rebound and  no guarding.  Musculoskeletal: Normal range of motion. She exhibits no edema and no tenderness.  Normal gait.  Neurological: She is alert and oriented to person, place, and time.  CN 3-12 intact, normal finger to nose, no pronator drift, sensation and strength intact bilaterally.  Skin: Skin is warm and dry.  Psychiatric: She has a normal mood and affect. Her behavior is normal. Judgment and thought content normal.    ED Course  Procedures (including critical care time) Labs Review Labs Reviewed - No data to display Imaging Review No results found.   EKG Interpretation None      MDM   Final diagnoses:  Headache  Conjunctivitis    Patient with headache and eye pain.  Suspect uncomplicated headache and likely conjunctivitis.  Will give headache cocktail and perform detailed eye exam.  Will reassess.  10:08 AM Patient states that her headache is feeling much better. We have been unable to locate the portable wood's lamp, therefore after the patient finishes her fluid, we'll move her to the eye around, and examine her left eye.  11:26 AM Headache has resolved.  Will treat empirically for conjunctivitis.  Her conjunctiva is injected, no vision loss, no evidence of entrapment.  Slit lamp and woods exam deferred 2/2 equipment malfunction.  I have given the patient clear return precautions.  She understands and agrees with the plan.  F/u with ophtho in 2 days if symptom do not improve.  Return here if symptoms worsen.  Montine Circle, PA-C 11/05/13 1128

## 2013-11-05 NOTE — ED Notes (Signed)
Pt walked over to Triage 4, to use the slit lamp, no woods lamp available for bedside.

## 2013-11-05 NOTE — ED Notes (Signed)
PA Browning at bedside. 

## 2013-12-22 DIAGNOSIS — E119 Type 2 diabetes mellitus without complications: Secondary | ICD-10-CM | POA: Insufficient documentation

## 2013-12-22 DIAGNOSIS — I209 Angina pectoris, unspecified: Secondary | ICD-10-CM | POA: Insufficient documentation

## 2013-12-22 DIAGNOSIS — G43909 Migraine, unspecified, not intractable, without status migrainosus: Secondary | ICD-10-CM | POA: Insufficient documentation

## 2013-12-22 DIAGNOSIS — E669 Obesity, unspecified: Secondary | ICD-10-CM | POA: Insufficient documentation

## 2013-12-22 DIAGNOSIS — I10 Essential (primary) hypertension: Secondary | ICD-10-CM | POA: Insufficient documentation

## 2013-12-22 DIAGNOSIS — G8929 Other chronic pain: Secondary | ICD-10-CM | POA: Insufficient documentation

## 2013-12-22 DIAGNOSIS — Z8659 Personal history of other mental and behavioral disorders: Secondary | ICD-10-CM | POA: Insufficient documentation

## 2013-12-22 DIAGNOSIS — Z79899 Other long term (current) drug therapy: Secondary | ICD-10-CM | POA: Insufficient documentation

## 2013-12-22 DIAGNOSIS — Z9889 Other specified postprocedural states: Secondary | ICD-10-CM | POA: Insufficient documentation

## 2013-12-22 DIAGNOSIS — Z7982 Long term (current) use of aspirin: Secondary | ICD-10-CM | POA: Insufficient documentation

## 2013-12-22 DIAGNOSIS — Z87891 Personal history of nicotine dependence: Secondary | ICD-10-CM | POA: Insufficient documentation

## 2013-12-22 DIAGNOSIS — E7800 Pure hypercholesterolemia, unspecified: Secondary | ICD-10-CM | POA: Insufficient documentation

## 2013-12-23 ENCOUNTER — Emergency Department (HOSPITAL_COMMUNITY): Payer: Self-pay

## 2013-12-23 ENCOUNTER — Emergency Department (HOSPITAL_COMMUNITY)
Admission: EM | Admit: 2013-12-23 | Discharge: 2013-12-23 | Disposition: A | Discharge status: Home / Self Care | Payer: Self-pay | Attending: Emergency Medicine | Admitting: Emergency Medicine

## 2013-12-23 ENCOUNTER — Encounter (HOSPITAL_COMMUNITY): Payer: Self-pay | Admitting: Emergency Medicine

## 2013-12-23 DIAGNOSIS — I10 Essential (primary) hypertension: Secondary | ICD-10-CM

## 2013-12-23 DIAGNOSIS — G43909 Migraine, unspecified, not intractable, without status migrainosus: Secondary | ICD-10-CM

## 2013-12-23 MED ORDER — SODIUM CHLORIDE 0.9 % IV SOLN
Freq: Once | INTRAVENOUS | Status: AC
  Administered 2013-12-23: 1000 mL via INTRAVENOUS

## 2013-12-23 MED ORDER — METOCLOPRAMIDE HCL 5 MG/ML IJ SOLN
10.0000 mg | Freq: Once | INTRAMUSCULAR | Status: AC
  Administered 2013-12-23: 10 mg via INTRAVENOUS
  Filled 2013-12-23: qty 2

## 2013-12-23 MED ORDER — METOPROLOL TARTRATE 1 MG/ML IV SOLN
5.0000 mg | Freq: Once | INTRAVENOUS | Status: DC
Start: 2013-12-23 — End: 2013-12-23

## 2013-12-23 MED ORDER — DIPHENHYDRAMINE HCL 50 MG/ML IJ SOLN
12.5000 mg | Freq: Once | INTRAMUSCULAR | Status: AC
  Administered 2013-12-23: 12.5 mg via INTRAVENOUS
  Filled 2013-12-23: qty 1

## 2013-12-23 MED ORDER — DEXAMETHASONE SODIUM PHOSPHATE 10 MG/ML IJ SOLN
10.0000 mg | Freq: Once | INTRAMUSCULAR | Status: AC
  Administered 2013-12-23: 10 mg via INTRAVENOUS
  Filled 2013-12-23: qty 1

## 2013-12-23 MED ORDER — CLONIDINE HCL 0.1 MG PO TABS
0.1000 mg | ORAL_TABLET | Freq: Once | ORAL | Status: AC
  Administered 2013-12-23: 0.1 mg via ORAL
  Filled 2013-12-23: qty 1

## 2013-12-23 NOTE — ED Provider Notes (Signed)
CSN: [id number]     Arrival date & time 12/22/13  2357 History   First MD Initiated Contact with Patient 12/23/13 0041     Chief Complaint  Patient presents with  . Migraine     (Consider location/radiation/quality/duration/timing/severity/associated sxs/prior Treatment) Patient is a 43 y.o. female presenting with migraines. The history is provided by the patient. No language interpreter was used.  Migraine Associated symptoms include headaches and nausea. Pertinent negatives include no chills, fever or vomiting. Associated symptoms comments: She complains of frontal headache like previous migraine headaches, associated with nausea, photophobia. No fever. No visual changes, chest pain, abdominal pain or neck stiffness..    Past Medical History  Diagnosis Date  . Hypertension   . Type II diabetes mellitus   . Anginal pain   . High cholesterol   . Shortness of breath 04/17/2012    "all the time"  . Headache(784.0) 04/17/2012    "~ qod; lately waking up in am w/one"  . Migraines 04/17/2012  . Chronic lower back pain 04/17/2012    "just got over some; catched when I walked"  . Depression   . Anxiety   . Sleep apnea   . Obesity    Past Surgical History  Procedure Laterality Date  . Cardiac catheterization  ~ 2011   No family history on file. History  Substance Use Topics  . Smoking status: Former Smoker -- 0.08 packs/day for 10 years    Types: Cigarettes    Quit date: 08/04/2013  . Smokeless tobacco: Never Used  . Alcohol Use: Yes     Comment: rare   OB History   Grav Para Term Preterm Abortions TAB SAB Ect Mult Living                 Review of Systems  Constitutional: Negative for fever and chills.  Eyes: Positive for photophobia.  Respiratory: Negative.   Cardiovascular: Negative.   Gastrointestinal: Positive for nausea. Negative for vomiting.  Musculoskeletal: Negative.   Skin: Negative.   Neurological: Positive for headaches.      Allergies  Morphine and  related  Home Medications   Prior to Admission medications   Medication Sig Start Date End Date Taking? Authorizing Provider  amLODipine (NORVASC) 10 MG tablet Take 1 tablet (10 mg total) by mouth daily. For hypertension. 09/11/13   Gerda Diss, DO  aspirin EC 81 MG EC tablet Take 1 tablet (81 mg total) by mouth daily. 09/04/13   Phill Myron, MD  cyclobenzaprine (FLEXERIL) 10 MG tablet Take 1 tablet (10 mg total) by mouth 3 (three) times daily as needed for muscle spasms. 09/11/13   Gerda Diss, DO  glipiZIDE (GLUCOTROL) 10 MG tablet Take 1 tablet (10 mg total) by mouth 2 (two) times daily before a meal. 09/11/13   Gerda Diss, DO  lisinopril-hydrochlorothiazide (ZESTORETIC) 20-12.5 MG per tablet Take 1 tablet by mouth daily. 09/11/13   Gerda Diss, DO  lovastatin (MEVACOR) 40 MG tablet Take 1 tablet (40 mg total) by mouth at bedtime. 09/11/13   Gerda Diss, DO  metFORMIN (GLUCOPHAGE) 1000 MG tablet Take 1 tablet (1,000 mg total) by mouth 2 (two) times daily with a meal. 09/11/13   Gerda Diss, DO  trimethoprim-polymyxin b (POLYTRIM) ophthalmic solution Place 1 drop into both eyes every 4 (four) hours. 11/05/13   Montine Circle, PA-C   BP 168/112  Pulse 89  Temp(Src) 98.3 F (36.8 C) (Oral)  Resp 16  Ht 5\' 1"  (1.549  m)  Wt 250 lb (113.399 kg)  BMI 47.26 kg/m2  SpO2 98%  LMP 12/16/2013 Physical Exam  Constitutional: She is oriented to person, place, and time. She appears well-developed and well-nourished.  HENT:  Head: Normocephalic.  Eyes: Conjunctivae are normal. Pupils are equal, round, and reactive to light.  Neck: Normal range of motion. Neck supple.  Cardiovascular: Normal rate and regular rhythm.   Pulmonary/Chest: Effort normal and breath sounds normal. She has no wheezes. She has no rales.  Abdominal: Soft. Bowel sounds are normal. There is no tenderness. There is no rebound and no guarding.  Musculoskeletal: Normal range of motion.  Neurological: She is  alert and oriented to person, place, and time. She has normal strength and normal reflexes. No sensory deficit. She displays a negative Romberg sign. Coordination normal.  CN's 3-12 grossly intact. Speech focused and clear. No deficits of coordination.  Skin: Skin is warm and dry. No rash noted.  Psychiatric: She has a normal mood and affect.    ED Course  Procedures (including critical care time) Labs Review Labs Reviewed - No data to display  Imaging Review No results found.   EKG Interpretation None      MDM   Final diagnoses:  None    1. Migraine headache 2. Hypertension  Blood pressure improved in ED without intervention. Last check 187/101. She reports that she takes her medication in the morning and that she has been compliant. Headache is typical of her migraine history. No chest pain, SOB, visual changes. Doubt headache is associated with hypertensive crisis. Migraine cocktail given in ED. Patient care transferred to Dr. Reather Converse for re-evaluation and disposition.     Dewaine Oats, PA-C 12/23/13 [phone]

## 2013-12-23 NOTE — ED Notes (Addendum)
Pt reports migraine x3 days with blurred vision in bilateral eyes. Pt having light sensitivity and dizziness on and off. Pt took tylenol and ibuprofen at home with no relief. Pt speaking in full sentences. sts feels like her typical migraines. Stroke scale negative. Pt takes bp medications at home in the mornings.

## 2013-12-23 NOTE — ED Provider Notes (Signed)
Medical screening examination/treatment/procedure(s) were conducted as a shared visit with non-physician practitioner(s) or resident  and myself.  I personally evaluated the patient during the encounter and agree with the findings and plan unless otherwise indicated.    I have personally reviewed any xrays and/ or EKG's with the provider and I agree with interpretation.   Patient with known high blood pressure for which he says she is taking her medicines regularly and no changes presents with gradual onset headache worsening over the past 3 days. Patient feels similar to previous. Mild photophobia. Nothing specific improves or worsens. No chest pain shortness breath. 5+ strength in UE and LE with f/e at major joints. Sensation to palpation intact in UE and LE. CNs 2-12 grossly intact.  EOMFI.  PERRL.   Finger nose and coordination intact bilateral.   Visual fields intact to finger testing. On recheck patient's headache improved however blood pressure worsened. Likely a combination of acute on chronic high blood pressure pain related. With cocaine abuse history CT head shows no acute bleeding. Patient improved on recheck and followup outpatient discussed. Patient will follow up outpatient for improved high blood pressure management. No concern for end organ damage at this time clinically. Filed Vitals:   12/23/13 0430 12/23/13 0435 12/23/13 0535 12/23/13 0555  BP: 177/99 180/105 165/92 147/104  Pulse: 87  87   Temp:      TempSrc:      Resp:   18   Height:      Weight:      SpO2: 94%  97%      High Blood pressure, HTN  Mariea Clonts, MD 12/23/13 [phone]

## 2013-12-23 NOTE — ED Notes (Signed)
Patient transported to CT 

## 2013-12-23 NOTE — ED Notes (Signed)
MD at bedside. 

## 2013-12-23 NOTE — Discharge Instructions (Signed)
Hypertension °As your heart beats, it forces blood through your arteries. This force is your blood pressure. If the pressure is too high, it is called hypertension (HTN) or high blood pressure. HTN is dangerous because you may have it and not know it. High blood pressure may mean that your heart has to work harder to pump blood. Your arteries may be narrow or stiff. The extra work puts you at risk for heart disease, stroke, and other problems.  °Blood pressure consists of two numbers, a higher number over a lower, 110/72, for example. It is stated as "110 over 72." The ideal is below 120 for the top number (systolic) and under 80 for the bottom (diastolic). Write down your blood pressure today. °You should pay close attention to your blood pressure if you have certain conditions such as: °· Heart failure. °· Prior heart attack. °· Diabetes °· Chronic kidney disease. °· Prior stroke. °· Multiple risk factors for heart disease. °To see if you have HTN, your blood pressure should be measured while you are seated with your arm held at the level of the heart. It should be measured at least twice. A one-time elevated blood pressure reading (especially in the Emergency Department) does not mean that you need treatment. There may be conditions in which the blood pressure is different between your right and left arms. It is important to see your caregiver soon for a recheck. °Most people have essential hypertension which means that there is not a specific cause. This type of high blood pressure may be lowered by changing lifestyle factors such as: °· Stress. °· Smoking. °· Lack of exercise. °· Excessive weight. °· Drug/tobacco/alcohol use. °· Eating less salt. °Most people do not have symptoms from high blood pressure until it has caused damage to the body. Effective treatment can often prevent, delay or reduce that damage. °TREATMENT  °When a cause has been identified, treatment for high blood pressure is directed at the  cause. There are a large number of medications to treat HTN. These fall into several categories, and your caregiver will help you select the medicines that are best for you. Medications may have side effects. You should review side effects with your caregiver. °If your blood pressure stays high after you have made lifestyle changes or started on medicines,  °· Your medication(s) may need to be changed. °· Other problems may need to be addressed. °· Be certain you understand your prescriptions, and know how and when to take your medicine. °· Be sure to follow up with your caregiver within the time frame advised (usually within two weeks) to have your blood pressure rechecked and to review your medications. °· If you are taking more than one medicine to lower your blood pressure, make sure you know how and at what times they should be taken. Taking two medicines at the same time can result in blood pressure that is too low. °SEEK IMMEDIATE MEDICAL CARE IF: °· You develop a severe headache, blurred or changing vision, or confusion. °· You have unusual weakness or numbness, or a faint feeling. °· You have severe chest or abdominal pain, vomiting, or breathing problems. °MAKE SURE YOU:  °· Understand these instructions. °· Will watch your condition. °· Will get help right away if you are not doing well or get worse. °Document Released: 08/14/2005 Document Revised: 11/06/2011 Document Reviewed: 04/03/2008 °ExitCare® Patient Information ©2014 [employer]. ° °Migraine Headache °A migraine headache is an intense, throbbing pain on one or both sides   of your head. A migraine can last for 30 minutes to several hours. °CAUSES  °The exact cause of a migraine headache is not always known. However, a migraine may be caused when nerves in the brain become irritated and release chemicals that cause inflammation. This causes pain. °Certain things may also trigger migraines, such  as: °· Alcohol. °· Smoking. °· Stress. °· Menstruation. °· Aged cheeses. °· Foods or drinks that contain nitrates, glutamate, aspartame, or tyramine. °· Lack of sleep. °· Chocolate. °· Caffeine. °· Hunger. °· Physical exertion. °· Fatigue. °· Medicines used to treat chest pain (nitroglycerine), birth control pills, estrogen, and some blood pressure medicines. °SIGNS AND SYMPTOMS °· Pain on one or both sides of your head. °· Pulsating or throbbing pain. °· Severe pain that prevents daily activities. °· Pain that is aggravated by any physical activity. °· Nausea, vomiting, or both. °· Dizziness. °· Pain with exposure to bright lights, loud noises, or activity. °· General sensitivity to bright lights, loud noises, or smells. °Before you get a migraine, you may get warning signs that a migraine is coming (aura). An aura may include: °· Seeing flashing lights. °· Seeing bright spots, halos, or zig-zag lines. °· Having tunnel vision or blurred vision. °· Having feelings of numbness or tingling. °· Having trouble talking. °· Having muscle weakness. °DIAGNOSIS  °A migraine headache is often diagnosed based on: °· Symptoms. °· Physical exam. °· A CT scan or MRI of your head. These imaging tests cannot diagnose migraines, but they can help rule out other causes of headaches. °TREATMENT °Medicines may be given for pain and nausea. Medicines can also be given to help prevent recurrent migraines.  °HOME CARE INSTRUCTIONS °· Only take over-the-counter or prescription medicines for pain or discomfort as directed by your health care provider. The use of long-term narcotics is not recommended. °· Lie down in a dark, quiet room when you have a migraine. °· Keep a journal to find out what may trigger your migraine headaches. For example, write down: °· What you eat and drink. °· How much sleep you get. °· Any change to your diet or medicines. °· Limit alcohol consumption. °· Quit smoking if you smoke. °· Get 7 9 hours of sleep, or as  recommended by your health care provider. °· Limit stress. °· Keep lights dim if bright lights bother you and make your migraines worse. °SEEK IMMEDIATE MEDICAL CARE IF:  °· Your migraine becomes severe. °· You have a fever. °· You have a stiff neck. °· You have vision loss. °· You have muscular weakness or loss of muscle control. °· You start losing your balance or have trouble walking. °· You feel faint or pass out. °· You have severe symptoms that are different from your first symptoms. °MAKE SURE YOU:  °· Understand these instructions. °· Will watch your condition. °· Will get help right away if you are not doing well or get worse. °Document Released: 08/14/2005 Document Revised: 06/04/2013 Document Reviewed: 04/21/2013 °ExitCare® Patient Information ©2014 [employer]. ° °

## 2013-12-23 NOTE — ED Provider Notes (Signed)
Headache improved, CT Head Negative , HTN improved   Patient states she has here HTN medications and is taking them on a regular baisis  Garald Balding, NP 12/23/13 [phone]

## 2013-12-24 NOTE — ED Provider Notes (Signed)
Medical screening examination/treatment/procedure(s) were conducted as a shared visit with non-physician practitioner(s) or resident  and myself.  I personally evaluated the patient during the encounter and agree with the findings and plan unless otherwise indicated.    I have personally reviewed any xrays and/ or EKG's with the provider and I agree with interpretation.   See separate note.  Mariea Clonts, MD 12/24/13 [phone]

## 2014-02-09 ENCOUNTER — Telehealth: Payer: Self-pay | Admitting: Sports Medicine

## 2014-02-18 NOTE — Telephone Encounter (Signed)
Called patient to come in for lab work. Phone stated pt is not accepting calls at this time. When pt calls back, please schedule for cholesterol and A1C test as part of pt DM care.

## 2014-03-25 ENCOUNTER — Encounter: Payer: Self-pay | Admitting: Home Health Services

## 2014-05-26 ENCOUNTER — Encounter: Payer: Self-pay | Admitting: Family Medicine

## 2014-05-26 ENCOUNTER — Encounter: Payer: Self-pay | Admitting: *Deleted

## 2014-05-26 NOTE — Progress Notes (Signed)
Will have a look in the morning.  Thank you!

## 2014-05-26 NOTE — Progress Notes (Signed)
Paper dropped off to be completed for plasma donor information.  Please call when completed.

## 2014-05-26 NOTE — Progress Notes (Signed)
Placed in PCP box for completion 

## 2014-05-29 NOTE — Progress Notes (Signed)
Attempted to call patient but phone is not accepting calls at this time.

## 2014-05-29 NOTE — Progress Notes (Signed)
yes

## 2014-05-29 NOTE — Progress Notes (Signed)
Has form been completed?

## 2014-06-01 NOTE — Progress Notes (Signed)
Attempted to call once again, phone not accepting calls

## 2014-06-02 NOTE — Progress Notes (Signed)
Attempted to call once again, phone not accepting calls. Will close note due to multiple calls, please inform patient if/when she calls.

## 2014-06-15 ENCOUNTER — Ambulatory Visit: Payer: Self-pay | Admitting: Family Medicine

## 2014-07-15 ENCOUNTER — Other Ambulatory Visit: Payer: Self-pay | Admitting: Sports Medicine

## 2014-07-16 ENCOUNTER — Other Ambulatory Visit: Payer: Self-pay | Admitting: Sports Medicine

## 2014-07-16 NOTE — Telephone Encounter (Signed)
Sent in for 3 more months.  Would love for her to come in for an appointment to discuss BP and meet for the first time.

## 2014-07-17 NOTE — Telephone Encounter (Signed)
Patient's phone is not accepting phone calls at this time

## 2014-07-20 NOTE — Telephone Encounter (Signed)
Refilled on 11/19.  Please verify.

## 2014-08-28 ENCOUNTER — Encounter (HOSPITAL_COMMUNITY): Payer: Self-pay | Admitting: Emergency Medicine

## 2014-08-28 ENCOUNTER — Emergency Department (HOSPITAL_COMMUNITY): Payer: Self-pay

## 2014-08-28 ENCOUNTER — Inpatient Hospital Stay (HOSPITAL_COMMUNITY)
Admission: EM | Admit: 2014-08-28 | Discharge: 2014-08-29 | DRG: 313 | Disposition: A | Discharge status: Home / Self Care | Payer: Self-pay | Attending: Family Medicine | Admitting: Family Medicine

## 2014-08-28 DIAGNOSIS — G8929 Other chronic pain: Secondary | ICD-10-CM | POA: Diagnosis present

## 2014-08-28 DIAGNOSIS — R079 Chest pain, unspecified: Principal | ICD-10-CM | POA: Diagnosis present

## 2014-08-28 DIAGNOSIS — I2582 Chronic total occlusion of coronary artery: Secondary | ICD-10-CM | POA: Diagnosis present

## 2014-08-28 DIAGNOSIS — E118 Type 2 diabetes mellitus with unspecified complications: Secondary | ICD-10-CM | POA: Insufficient documentation

## 2014-08-28 DIAGNOSIS — Z6841 Body Mass Index (BMI) 40.0 and over, adult: Secondary | ICD-10-CM

## 2014-08-28 DIAGNOSIS — Z23 Encounter for immunization: Secondary | ICD-10-CM

## 2014-08-28 DIAGNOSIS — F419 Anxiety disorder, unspecified: Secondary | ICD-10-CM | POA: Diagnosis present

## 2014-08-28 DIAGNOSIS — G4730 Sleep apnea, unspecified: Secondary | ICD-10-CM | POA: Diagnosis present

## 2014-08-28 DIAGNOSIS — Z8249 Family history of ischemic heart disease and other diseases of the circulatory system: Secondary | ICD-10-CM

## 2014-08-28 DIAGNOSIS — I119 Hypertensive heart disease without heart failure: Secondary | ICD-10-CM | POA: Diagnosis present

## 2014-08-28 DIAGNOSIS — M545 Low back pain: Secondary | ICD-10-CM | POA: Diagnosis present

## 2014-08-28 DIAGNOSIS — E785 Hyperlipidemia, unspecified: Secondary | ICD-10-CM | POA: Diagnosis present

## 2014-08-28 DIAGNOSIS — Z79899 Other long term (current) drug therapy: Secondary | ICD-10-CM

## 2014-08-28 DIAGNOSIS — Z9114 Patient's other noncompliance with medication regimen: Secondary | ICD-10-CM | POA: Diagnosis present

## 2014-08-28 DIAGNOSIS — Z7982 Long term (current) use of aspirin: Secondary | ICD-10-CM

## 2014-08-28 DIAGNOSIS — F329 Major depressive disorder, single episode, unspecified: Secondary | ICD-10-CM | POA: Diagnosis present

## 2014-08-28 DIAGNOSIS — E119 Type 2 diabetes mellitus without complications: Secondary | ICD-10-CM | POA: Diagnosis present

## 2014-08-28 DIAGNOSIS — R739 Hyperglycemia, unspecified: Secondary | ICD-10-CM

## 2014-08-28 DIAGNOSIS — I10 Essential (primary) hypertension: Secondary | ICD-10-CM | POA: Insufficient documentation

## 2014-08-28 DIAGNOSIS — F17210 Nicotine dependence, cigarettes, uncomplicated: Secondary | ICD-10-CM | POA: Diagnosis present

## 2014-08-28 LAB — CBC WITH DIFFERENTIAL/PLATELET
Basophils Absolute: 0.1 10*3/uL (ref 0.0–0.1)
Basophils Relative: 1 % (ref 0–1)
Eosinophils Absolute: 0.4 10*3/uL (ref 0.0–0.7)
Eosinophils Relative: 5 % (ref 0–5)
HCT: 36.9 % (ref 36.0–46.0)
Hemoglobin: 12 g/dL (ref 12.0–15.0)
LYMPHS PCT: 32 % (ref 12–46)
Lymphs Abs: 2.6 10*3/uL (ref 0.7–4.0)
MCH: 23.8 pg — ABNORMAL LOW (ref 26.0–34.0)
MCHC: 32.5 g/dL (ref 30.0–36.0)
MCV: 73.1 fL — ABNORMAL LOW (ref 78.0–100.0)
Monocytes Absolute: 0.6 10*3/uL (ref 0.1–1.0)
Monocytes Relative: 7 % (ref 3–12)
NEUTROS ABS: 4.4 10*3/uL (ref 1.7–7.7)
NEUTROS PCT: 55 % (ref 43–77)
PLATELETS: 335 10*3/uL (ref 150–400)
RBC: 5.05 MIL/uL (ref 3.87–5.11)
RDW: 16.3 % — ABNORMAL HIGH (ref 11.5–15.5)
WBC: 8 10*3/uL (ref 4.0–10.5)

## 2014-08-28 LAB — TSH: TSH: 0.561 u[IU]/mL (ref 0.350–4.500)

## 2014-08-28 LAB — PREGNANCY, URINE: Preg Test, Ur: NEGATIVE

## 2014-08-28 LAB — URINALYSIS, ROUTINE W REFLEX MICROSCOPIC
BILIRUBIN URINE: NEGATIVE
GLUCOSE, UA: 500 mg/dL — AB
KETONES UR: NEGATIVE mg/dL
LEUKOCYTES UA: NEGATIVE
Nitrite: NEGATIVE
PH: 6.5 (ref 5.0–8.0)
Protein, ur: 100 mg/dL — AB
SPECIFIC GRAVITY, URINE: 1.015 (ref 1.005–1.030)
Urobilinogen, UA: 0.2 mg/dL (ref 0.0–1.0)

## 2014-08-28 LAB — COMPREHENSIVE METABOLIC PANEL
ALT: 11 U/L (ref 0–35)
ANION GAP: 11 (ref 5–15)
AST: 20 U/L (ref 0–37)
Albumin: 3.1 g/dL — ABNORMAL LOW (ref 3.5–5.2)
Alkaline Phosphatase: 67 U/L (ref 39–117)
BUN: 13 mg/dL (ref 6–23)
CALCIUM: 9.3 mg/dL (ref 8.4–10.5)
CO2: 23 mmol/L (ref 19–32)
CREATININE: 0.99 mg/dL (ref 0.50–1.10)
Chloride: 105 mEq/L (ref 96–112)
GFR, EST AFRICAN AMERICAN: 80 mL/min — AB (ref >90)
GFR, EST NON AFRICAN AMERICAN: 69 mL/min — AB (ref >90)
GLUCOSE: 239 mg/dL — AB (ref 70–99)
Potassium: 3.7 mmol/L (ref 3.5–5.1)
Sodium: 139 mmol/L (ref 135–145)
Total Bilirubin: 0.6 mg/dL (ref 0.3–1.2)
Total Protein: 7.4 g/dL (ref 6.0–8.3)

## 2014-08-28 LAB — LIPID PANEL
CHOL/HDL RATIO: 4.4 ratio
Cholesterol: 196 mg/dL (ref 0–200)
HDL: 45 mg/dL (ref >39)
LDL Cholesterol: 125 mg/dL — ABNORMAL HIGH (ref 0–99)
Triglycerides: 129 mg/dL (ref <150)
VLDL: 26 mg/dL (ref 0–40)

## 2014-08-28 LAB — URINE MICROSCOPIC-ADD ON

## 2014-08-28 LAB — RAPID URINE DRUG SCREEN, HOSP PERFORMED
Amphetamines: NOT DETECTED
Barbiturates: NOT DETECTED
Benzodiazepines: NOT DETECTED
COCAINE: NOT DETECTED
Opiates: NOT DETECTED
Tetrahydrocannabinol: POSITIVE — AB

## 2014-08-28 LAB — TROPONIN I
Troponin I: <0.03 ng/mL (ref <0.031)
Troponin I: <0.03 ng/mL (ref <0.031)
Troponin I: <0.03 ng/mL (ref <0.031)

## 2014-08-28 LAB — GLUCOSE, CAPILLARY
GLUCOSE-CAPILLARY: 244 mg/dL — AB (ref 70–99)
GLUCOSE-CAPILLARY: 254 mg/dL — AB (ref 70–99)

## 2014-08-28 LAB — BRAIN NATRIURETIC PEPTIDE: B NATRIURETIC PEPTIDE 5: 78.4 pg/mL (ref 0.0–100.0)

## 2014-08-28 MED ORDER — LISINOPRIL 20 MG PO TABS
20.0000 mg | ORAL_TABLET | Freq: Every day | ORAL | Status: DC
  Administered 2014-08-28 – 2014-08-29 (×2): 20 mg via ORAL
  Filled 2014-08-28 (×2): qty 1

## 2014-08-28 MED ORDER — INSULIN ASPART 100 UNIT/ML ~~LOC~~ SOLN: 0.0000 [IU] | Freq: Three times a day (TID) | SUBCUTANEOUS | Status: DC

## 2014-08-28 MED ORDER — ASPIRIN 325 MG PO TABS: 325.0000 mg | ORAL_TABLET | Freq: Once | ORAL | Status: DC

## 2014-08-28 MED ORDER — HYDROMORPHONE HCL 1 MG/ML IJ SOLN
1.0000 mg | INTRAMUSCULAR | Status: AC | PRN
Start: 2014-08-28 — End: 2014-08-29

## 2014-08-28 MED ORDER — MORPHINE SULFATE 4 MG/ML IJ SOLN: 4.0000 mg | Freq: Once | INTRAMUSCULAR | Status: DC

## 2014-08-28 MED ORDER — INSULIN ASPART 100 UNIT/ML ~~LOC~~ SOLN
0.0000 [IU] | Freq: Three times a day (TID) | SUBCUTANEOUS | Status: DC
  Administered 2014-08-28 – 2014-08-29 (×2): 3 [IU] via SUBCUTANEOUS
  Administered 2014-08-29: 5 [IU] via SUBCUTANEOUS
  Administered 2014-08-29: 2 [IU] via SUBCUTANEOUS

## 2014-08-28 MED ORDER — SODIUM CHLORIDE 0.9 % IV SOLN
INTRAVENOUS | Status: DC
  Administered 2014-08-28: 16:00:00 via INTRAVENOUS

## 2014-08-28 MED ORDER — NITROGLYCERIN 0.4 MG SL SUBL: 0.4000 mg | SUBLINGUAL_TABLET | SUBLINGUAL | Status: DC | PRN

## 2014-08-28 MED ORDER — ASPIRIN EC 81 MG PO TBEC
81.0000 mg | DELAYED_RELEASE_TABLET | Freq: Every day | ORAL | Status: DC
Start: 2014-08-28 — End: 2014-08-29
  Administered 2014-08-29: 81 mg via ORAL
  Filled 2014-08-28: qty 1

## 2014-08-28 MED ORDER — ONDANSETRON HCL 4 MG/2ML IJ SOLN: 4.0000 mg | Freq: Three times a day (TID) | INTRAMUSCULAR | Status: DC | PRN

## 2014-08-28 MED ORDER — HYDRALAZINE HCL 20 MG/ML IJ SOLN: 10.0000 mg | Freq: Four times a day (QID) | INTRAMUSCULAR | Status: DC | PRN

## 2014-08-28 MED ORDER — HYDROCHLOROTHIAZIDE 12.5 MG PO CAPS
12.5000 mg | ORAL_CAPSULE | Freq: Every day | ORAL | Status: DC
  Administered 2014-08-28 – 2014-08-29 (×2): 12.5 mg via ORAL
  Filled 2014-08-28 (×2): qty 1

## 2014-08-28 MED ORDER — LISINOPRIL-HYDROCHLOROTHIAZIDE 20-12.5 MG PO TABS: 1.0000 | ORAL_TABLET | Freq: Every day | ORAL | Status: DC

## 2014-08-28 MED ORDER — HYDROMORPHONE HCL 1 MG/ML IJ SOLN
1.0000 mg | Freq: Once | INTRAMUSCULAR | Status: AC
  Administered 2014-08-28: 1 mg via INTRAVENOUS
  Filled 2014-08-28: qty 1

## 2014-08-28 MED ORDER — LABETALOL HCL 5 MG/ML IV SOLN
20.0000 mg | Freq: Once | INTRAVENOUS | Status: AC
  Administered 2014-08-28: 20 mg via INTRAVENOUS
  Filled 2014-08-28: qty 4

## 2014-08-28 MED ORDER — SODIUM CHLORIDE 0.9 % IV BOLUS (SEPSIS)
1000.0000 mL | Freq: Once | INTRAVENOUS | Status: AC
  Administered 2014-08-28: 1000 mL via INTRAVENOUS

## 2014-08-28 MED ORDER — AMLODIPINE BESYLATE 10 MG PO TABS
10.0000 mg | ORAL_TABLET | Freq: Every day | ORAL | Status: DC
  Administered 2014-08-29: 10 mg via ORAL
  Filled 2014-08-28: qty 1

## 2014-08-28 MED ORDER — HEPARIN SODIUM (PORCINE) 5000 UNIT/ML IJ SOLN
5000.0000 [IU] | Freq: Three times a day (TID) | INTRAMUSCULAR | Status: DC
  Administered 2014-08-28 – 2014-08-29 (×3): 5000 [IU] via SUBCUTANEOUS
  Filled 2014-08-28 (×3): qty 1

## 2014-08-28 MED ORDER — INSULIN ASPART 100 UNIT/ML ~~LOC~~ SOLN
0.0000 [IU] | Freq: Every day | SUBCUTANEOUS | Status: DC
  Administered 2014-08-28: 3 [IU] via SUBCUTANEOUS

## 2014-08-28 MED ORDER — AMLODIPINE BESYLATE 5 MG PO TABS
5.0000 mg | ORAL_TABLET | Freq: Every day | ORAL | Status: DC
  Administered 2014-08-28: 5 mg via ORAL
  Filled 2014-08-28: qty 1

## 2014-08-28 MED ORDER — PRAVASTATIN SODIUM 40 MG PO TABS
40.0000 mg | ORAL_TABLET | Freq: Every day | ORAL | Status: DC
  Administered 2014-08-28 – 2014-08-29 (×2): 40 mg via ORAL
  Filled 2014-08-28 (×2): qty 1

## 2014-08-28 MED ORDER — ACETAMINOPHEN 325 MG PO TABS: 650.0000 mg | ORAL_TABLET | ORAL | Status: DC | PRN

## 2014-08-28 MED ORDER — INFLUENZA VAC SPLIT QUAD 0.5 ML IM SUSY
0.5000 mL | PREFILLED_SYRINGE | INTRAMUSCULAR | Status: AC
  Administered 2014-08-29: 0.5 mL via INTRAMUSCULAR
  Filled 2014-08-28: qty 0.5

## 2014-08-28 MED ORDER — ONDANSETRON HCL 4 MG/2ML IJ SOLN: 4.0000 mg | Freq: Four times a day (QID) | INTRAMUSCULAR | Status: DC | PRN

## 2014-08-28 NOTE — ED Notes (Signed)
EDP at bedside  

## 2014-08-28 NOTE — ED Notes (Signed)
Patient transported to X-ray 

## 2014-08-28 NOTE — H&P (Signed)
[facility] Admission History and Physical Service Pager: [phone]  [name] Medical record number: [id number] Date of birth: 09-30-1970 Age: 44 y.o. Gender: female  Primary Care Provider: Janora Norlander, DO Consultants: Cardiology Code Status: Full  Chief Complaint: Chest pain and shortness of breath  Assessment and Plan: Jami Bogdanski is a 44 y.o. female presenting with chest pain and shortness of breath. PMH is significant for hypertension, type 2 diabetes, hyperlipidemia, migraines, depression, anxiety, sleep apnea, morbid obesity.   #Chest pain: Patient is complaining of substernal chest pain and dyspnea prior to admission. Pain is not reproducible with palpation. Patient is a very significant family history for cardiac disease as well as risk factors including hypertension hyperlipidemia and type 2 diabetes. Patient has been noncompliant with medications for the past 1.5 months. Similar chest pain was experienced by the patient in 2012 when she was treated in [place]. Patient underwent a cardiac catheterization during this time. Records from her time in [place] yielded a note discussing a 100% occlusion of the proximal right coronary artery which was not stented at the time. This study also showed 20% occlusion of the proximal circumflex, 40% occlusion of the proximal LAD, and 20% occlusion of the first diagonal branch of the LAD. Chest pain currently resolved. - Admit to family medicine inpatient teaching service - Telemetry - Cardiology consulted: Appreciate the recommendations - EKG showing new lateral T-wave depressions  - Cardiology believes EKG changes 2/2 HTN and LV strain - Patient received aspirin and nitroglycerin by EMS; patient received Dilaudid and labetalol in ED. - ASA 81 mg daily - Dilaudid 1 mg every 4 hours when necessary - Supplemental oxygen, keep patient > 92% O2 sat - Follow-up a.m. EKG -  Echocardiogram for tomorrow - Cycle troponins - f/u A1c, TSH and lipid panel  #Dyspnea: Likely cardiac etiology. Chest x-ray benign for any acute processes. No current dyspnea. - Continue to monitor respirations and O2 saturation. - Echocardiogram ordered for tomorrow  #Hypertension; from previous progress notes it appears that patient has near her baseline blood pressure. However she is bordering hypertensive urgency at this time. - Reduced home dosing of amlodipine down to 5 mg today, increase to 10mg  in AM - Continue to monitor for hypertensive urgency/emergency. - Continue home dosing of hydrochlorothiazide and lisinopril - hydralazine 10mg  q6hrs PRN for BP >180/100  #Type 2 diabetes - Holding home metformin and glipizide - SSI sensitive  #Hyperlipidemia - Lovastatin 40 mg, home dose  #Depression/anxiety - Monitor, no home medications for this. - Mood stable  #Social: patient stating non-compliance because of inability to pay for medication - CM consult for medication needs  FEN/GI: Normal saline at 125 ml/hr; heart healthy/carb modified diet Prophylaxis: Subcutaneous heparin  Disposition: Admit under telemetry.  History of Present Illness: Diamond Rice is a 44 y.o. female presenting with chest pain shortness of breath. Patient states that her chest pain began last night. She describes this pain as a feeling of heaviness and pressure on her chest without radiation. This pain lasted for approximately 2 hours. The pain started while she was laying down and improved on its own. She denies any aggravating factors. She has not taken any of her medication over the past 1.5 months. Patient reports associated symptoms of shortness of breath and headaches with this chest pain. These symptoms have improved since reporting to the ED. Patient denies any nausea or vomiting. Also of note: Patient reports that she had had a  heart catheterization in [place] back in 2012. She was also  admitted to the ICU during that admission.   In the ED patient had a EKG which showed new lateral T-wave depressions. Troponins were obtained and were negative. Chest x-ray was negative. Records from her time in [place] were obtained while patient was in the ED. Cardiology was consulted and have agreed to see the patient.  Patient was admitted to inpatient family medicine teaching service.   Review Of Systems: Per HPI Otherwise 12 point review of systems was performed and was unremarkable.  Patient Active Problem List   Diagnosis Date Noted  . Back pain 09/11/2013  . Psychoactive substance-induced organic mood disorder 05/28/2013  . Cocaine abuse with cocaine-induced mood disorder 05/28/2013  . Cannabis dependence with cannabis-induced anxiety disorder 05/28/2013  . Morbid obesity with BMI of 45.0-49.9, adult 04/25/2012  . Chest pain 04/17/2012  . Hypertension 04/17/2012  . DM (diabetes mellitus), type 2, uncontrolled 04/17/2012  . Hyperlipidemia 04/17/2012  . Tobacco use 04/17/2012   Past Medical History: Past Medical History  Diagnosis Date  . Hypertension   . Type II diabetes mellitus   . Anginal pain   . High cholesterol   . Shortness of breath 04/17/2012    "all the time"  . Headache(784.0) 04/17/2012    "~ qod; lately waking up in am w/one"  . Migraines 04/17/2012  . Chronic lower back pain 04/17/2012    "just got over some; catched when I walked"  . Depression   . Anxiety   . Sleep apnea   . Obesity    Past Surgical History: Past Surgical History  Procedure Laterality Date  . Cardiac catheterization  ~ 2011   Social History: History  Substance Use Topics  . Smoking status: Former Smoker -- 0.08 packs/day for 10 years    Types: Cigarettes    Quit date: 08/04/2013  . Smokeless tobacco: Never Used  . Alcohol Use: Yes     Comment: rare   Additional social history: Patient's mother died of MI at age 37, patient's brother has already experienced one MI.   Please also refer to relevant sections of EMR.  Family History: History reviewed. No pertinent family history. Allergies and Medications: Allergies  Allergen Reactions  . Morphine And Related Hives   No current facility-administered medications on file prior to encounter.   Current Outpatient Prescriptions on File Prior to Encounter  Medication Sig Dispense Refill  . acetaminophen (TYLENOL) 325 MG tablet Take 650 mg by mouth every 6 (six) hours as needed for headache.    Marland Kitchen amLODipine (NORVASC) 10 MG tablet TAKE 1 TABLET BY MOUTH DAILY FOR HYPERTENSION 30 tablet 2  . aspirin EC 81 MG EC tablet Take 1 tablet (81 mg total) by mouth daily. 30 tablet 0  . glipiZIDE (GLUCOTROL) 10 MG tablet Take 1 tablet (10 mg total) by mouth 2 (two) times daily before a meal. 60 tablet 5  . lisinopril-hydrochlorothiazide (ZESTORETIC) 20-12.5 MG per tablet Take 1 tablet by mouth daily. 30 tablet 5  . lovastatin (MEVACOR) 40 MG tablet Take 1 tablet (40 mg total) by mouth at bedtime. 30 tablet 5  . metFORMIN (GLUCOPHAGE) 1000 MG tablet Take 1 tablet (1,000 mg total) by mouth 2 (two) times daily with a meal. 60 tablet 5    Objective: BP 166/89 mmHg  Pulse 73  Temp(Src) 97.5 F (36.4 C) (Oral)  Resp 17  Ht 5\' 1"  (1.549 m)  Wt 231 lb 1.6 oz (104.826 kg)  BMI  43.69 kg/m2  SpO2 100% Exam: General -- oriented x3, pleasant and cooperative. Morbidly obese. HEENT -- Head is normocephalic. PERRLA. EOMI. Ears, nose and throat were benign. Integument -- intact. No rash, erythema, or ecchymoses.  Chest -- good expansion. Lungs clear to auscultation. Cardiac -- RRR. 2/6 systolic murmur Abdomen -- soft, nontender. No masses palpable. Bowel sounds present. CNS -- cranial nerves II through XII grossly intact. Extremeties - no tenderness or effusions noted. ROM good. 5/5 bilateral strength. Dorsalis pedis pulses present and symmetrical.    Labs and Imaging: CBC BMET   Recent Labs Lab 08/28/14 1131  WBC 8.0   HGB 12.0  HCT 36.9  PLT 335    Recent Labs Lab 08/28/14 1131  NA 139  K 3.7  CL 105  CO2 23  BUN 13  CREATININE 0.99  GLUCOSE 239*  CALCIUM 9.3       Elberta Leatherwood, MD 08/28/2014, 7:55 PM PGY-1, [place] [facility] pager: [phone], text pages welcome  I have seen and examined the patient. I have read and agree with the above note. My changes are noted in blue.  Cordelia Poche, MD PGY-2, [facility] 08/28/2014, 10:08 PM

## 2014-08-28 NOTE — Consult Note (Signed)
CARDIOLOGY CONSULT NOTE   Patient ID: Diamond Rice MRN: [id number], DOB/AGE: 1971/03/28   Admit date: 08/28/2014 Date of Consult: 08/28/2014   Primary Physician: Janora Norlander, DO Primary Cardiologist: None  Pt. Profile  44 year old African-American woman admitted with high blood pressure and chest pain.  She has been out of her medication for more than a month.  Problem List  Past Medical History  Diagnosis Date  . Hypertension   . Type II diabetes mellitus   . Anginal pain   . High cholesterol   . Shortness of breath 04/17/2012    "all the time"  . Headache(784.0) 04/17/2012    "~ qod; lately waking up in am w/one"  . Migraines 04/17/2012  . Chronic lower back pain 04/17/2012    "just got over some; catched when I walked"  . Depression   . Anxiety   . Sleep apnea   . Obesity     Past Surgical History  Procedure Laterality Date  . Cardiac catheterization  ~ 2011     Allergies  Allergies  Allergen Reactions  . Morphine And Related Hives    HPI   This 44 year old African-American woman is admitted with poorly controlled hypertension and precordial chest discomfort.  She has a history of severe hypertension.  Review of her old records reveals that in June 2014 the patient was admitted to [facility] in [place] with blood pressure of 250/140.  At that time she gives a history that she had been out of her blood pressure medication for several months.  At that time she was admitted to their ICU and was treated initially with a Cardene drip and she improved rapidly once she was placed back on her chronic medications.  She has a history of morbid obesity, type 2 diabetes, hypertension, and tobacco abuse.  She also has suspected obstructive sleep apnea.  She states that she underwent a cardiac catheterization at [facility] in 2011 or 2012.  Outside records indicate that she had a 100% occluded right coronary artery.  She did not require  PCI She has a family history of coronary disease.  She states that her mother died from a myocardial infarction at age 29. 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  She smokes about half a pack of cigarettes a day.  She does not use cocaine or illicit drugs. She was in her usual state of health until last evening while lying down she began to experience a precordial heaviness.  It did not radiate to the arms.  There was no diaphoresis.  She came to the emergency room where her troponins have been normal.  Her electrocardiogram shows a pattern of left ventricular hypertrophy with strain and/or ischemia.  Inpatient Medications  . aspirin  325 mg Oral Once    Family History History reviewed. No pertinent family history.   Social History History   Social History  . Marital Status: Single    Spouse Name: N/A    Number of Children: N/A  . Years of Education: N/A   Occupational History  . Not on file.   Social History Main Topics  . Smoking status: Former Smoker -- 0.08 packs/day for 10 years    Types: Cigarettes    Quit date: 08/04/2013  . Smokeless tobacco: Never Used  . Alcohol Use: Yes     Comment: rare  . Drug Use: No  . Sexual Activity: Not Currently  Other Topics Concern  . Not on file   Social History Narrative   CNA at [employer].  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     Review of Systems  General:  No chills, fever, night sweats or weight changes.  Cardiovascular:  No , edema, orthopnea, palpitations, paroxysmal nocturnal dyspnea. Dermatological: No rash, lesions/masses Respiratory: No cough, dyspnea Urologic: No hematuria, dysuria Abdominal:   No nausea, vomiting, diarrhea, bright red blood per rectum, melena, or hematemesis Neurologic:  No visual changes, wkns, changes in mental status. All other systems reviewed and are otherwise negative except as noted above.  Physical Exam  Blood pressure 206/119, pulse 74,  temperature 98 F (36.7 C), temperature source Oral, resp. rate 18, height 5\' 1"  (1.549 m), weight 231 lb 1.6 oz (104.826 kg), SpO2 97 %.  General: Pleasant, NAD.  Obese Psych: Normal affect. Neuro: Alert and oriented X 3. Moves all extremities spontaneously. HEENT: Normal  Neck: Supple without bruits or JVD. Lungs:  Resp regular and unlabored, CTA. Heart: RRR no s3, s4.  Soft basilar systolic murmur.  Question faint aortic diastolic murmur. Abdomen: Soft, non-tender, non-distended, BS + x 4.  Extremities: No clubbing, cyanosis or edema. DP/PT/Radials 2+ and equal bilaterally.  Labs   Recent Labs  08/28/14 1131  TROPONINI <0.03   Lab Results  Component Value Date   WBC 8.0 08/28/2014   HGB 12.0 08/28/2014   HCT 36.9 08/28/2014   MCV 73.1* 08/28/2014   PLT 335 08/28/2014     Recent Labs Lab 08/28/14 1131  NA 139  K 3.7  CL 105  CO2 23  BUN 13  CREATININE 0.99  CALCIUM 9.3  PROT 7.4  BILITOT 0.6  ALKPHOS 67  ALT 11  AST 20  GLUCOSE 239*   Lab Results  Component Value Date   CHOL 202* 09/04/2013   HDL 48 09/04/2013   LDLCALC 107* 09/04/2013   TRIG 236* 09/04/2013   Lab Results  Component Value Date   DDIMER 0.28 04/17/2012    Radiology/Studies  Dg Chest 2 View  08/28/2014   CLINICAL DATA:  Right chest and back pain 3 days after a fall.  EXAM: CHEST  2 VIEW  COMPARISON:  September 03, 2013  FINDINGS: The heart size and mediastinal contours are stable. The heart size is enlarged. The aorta is tortuous. There is mild chronic scar of the left mid lung unchanged. There is no focal pneumonia, pulmonary edema, or pleural effusion. The visualized skeletal structures are stable.  IMPRESSION: No acute cardiopulmonary disease identified. Cardiomegaly. Chronic scar of left lung.   Electronically Signed   By: Abelardo Diesel M.D.   On: 08/28/2014 12:37    ECG  Sinus rhythm Left ventricular hypertrophy Anterior Q waves, possibly due to LVH Abnormal T, consider ischemia,  lateral leads No significant change was found Confirmed by CAMPOS MD, Lennette Bihari ([id number]) on 08/28/2014 12:49:40 PM  Personally reviewed by me.  I suspect that the ST-T wave changes are secondary to hypertension and LV strain.  ASSESSMENT AND PLAN  1.  Hypertensive cardiovascular disease, off all medications for the past month because of lack of financial resources. 2.  Past history of single vessel coronary artery disease (right coronary artery) by cardiac catheterization at [facility] in 2011 or 2012.  She had a negative Lexiscan Myoview stress test here at [facility] on 04/19/12 showing no reversible ischemia and her ejection fraction was 66% 3.  Morbid obesity 4.  Diabetes mellitus 5.  Cardiomegaly with a left ventricular configuration  Disposition: The patient will be admitted on the medical service.  Her blood pressure medication will be resumed.  Cycle cardiac enzymes.  Repeat EKG tomorrow. We will update her echocardiogram. The important thing in her management will be to see if somehow she will be able to afford to stay on her needed blood pressure and diabetes medications.   Signed, Darlin Coco, MD  08/28/2014, 2:31 PM

## 2014-08-28 NOTE — ED Notes (Signed)
Family practice MD at bedside.

## 2014-08-28 NOTE — ED Provider Notes (Signed)
CSN: [id number]     Arrival date & time 08/28/14  1109 History   First MD Initiated Contact with Patient 08/28/14 1109     Chief Complaint  Patient presents with  . Chest Pain     (Consider location/radiation/quality/duration/timing/severity/associated sxs/prior Treatment) The history is provided by the patient.     Pt with hx HTN, HL, DM, obesity, prior cocaine abuse, prior smoker, with reported CAD (cath in [place], [place] with no records available, pt states she had a  blockage, no stents placed, unsure if MI), presents with central chest pressure with associated SOB and nausea.  Also with headache.  The pain began last night when she went to bed around 1/1:30am, the pain has waxed and waned, now 9/10 intensity, worse after walking 15 minutes this morning.  Headache is frontal and pounding. Was given aspirin by EMS.  She has been off all of her home medications for financial reasons.  Denies recent URI symptoms, focal neurologic deficits, leg swelling.  Reports brother who has had MI and mother who died of MI in her 74s.  Past Medical History  Diagnosis Date  . Hypertension   . Type II diabetes mellitus   . Anginal pain   . High cholesterol   . Shortness of breath 04/17/2012    "all the time"  . Headache(784.0) 04/17/2012    "~ qod; lately waking up in am w/one"  . Migraines 04/17/2012  . Chronic lower back pain 04/17/2012    "just got over some; catched when I walked"  . Depression   . Anxiety   . Sleep apnea   . Obesity    Past Surgical History  Procedure Laterality Date  . Cardiac catheterization  ~ 2011   History reviewed. No pertinent family history. History  Substance Use Topics  . Smoking status: Former Smoker -- 0.08 packs/day for 10 years    Types: Cigarettes    Quit date: 08/04/2013  . Smokeless tobacco: Never Used  . Alcohol Use: Yes     Comment: rare   OB History    No data available     Review of Systems  All other systems reviewed and are  negative.     Allergies  Morphine and related  Home Medications   Prior to Admission medications   Medication Sig Start Date End Date Taking? Authorizing Provider  acetaminophen (TYLENOL) 325 MG tablet Take 650 mg by mouth every 6 (six) hours as needed for headache.    Historical Provider, MD  amLODipine (NORVASC) 10 MG tablet Take 1 tablet (10 mg total) by mouth daily. For hypertension. 09/11/13   Gerda Diss, DO  amLODipine (NORVASC) 10 MG tablet TAKE 1 TABLET BY MOUTH DAILY FOR HYPERTENSION 07/16/14   Janora Norlander, DO  aspirin EC 81 MG EC tablet Take 1 tablet (81 mg total) by mouth daily. 09/04/13   Olam Idler, MD  glipiZIDE (GLUCOTROL) 10 MG tablet Take 1 tablet (10 mg total) by mouth 2 (two) times daily before a meal. 09/11/13   Gerda Diss, DO  lisinopril-hydrochlorothiazide (ZESTORETIC) 20-12.5 MG per tablet Take 1 tablet by mouth daily. 09/11/13   Gerda Diss, DO  lovastatin (MEVACOR) 40 MG tablet Take 1 tablet (40 mg total) by mouth at bedtime. 09/11/13   Gerda Diss, DO  metFORMIN (GLUCOPHAGE) 1000 MG tablet Take 1 tablet (1,000 mg total) by mouth 2 (two) times daily with a meal. 09/11/13   Gerda Diss, DO   BP 185/113  mmHg  Pulse 91  Temp(Src) 98.4 F (36.9 C) (Oral)  Resp 19  Ht 5\' 1"  (1.549 m)  Wt 235 lb (106.595 kg)  BMI 44.43 kg/m2  SpO2 99% Physical Exam  Constitutional: She appears well-developed and well-nourished. No distress.  HENT:  Head: Normocephalic and atraumatic.  Eyes: Conjunctivae are normal.  Neck: Normal range of motion. Neck supple.  Cardiovascular: Normal rate, regular rhythm and intact distal pulses.   Pulmonary/Chest: Effort normal and breath sounds normal. No respiratory distress. She has no wheezes. She has no rales.  Abdominal: Soft. She exhibits no distension. There is no tenderness. There is no rebound and no guarding.  Musculoskeletal: She exhibits no edema.  Neurological: She is alert. She exhibits normal muscle  tone.  Skin: No rash noted. She is not diaphoretic.  Psychiatric: She has a normal mood and affect. Her behavior is normal.  Nursing note and vitals reviewed.   ED Course  Procedures (including critical care time) Labs Review Labs Reviewed  COMPREHENSIVE METABOLIC PANEL - Abnormal; Notable for the following:    Glucose, Bld 239 (*)    Albumin 3.1 (*)    GFR calc non Af Amer 69 (*)    GFR calc Af Amer 80 (*)    All other components within normal limits  CBC WITH DIFFERENTIAL - Abnormal; Notable for the following:    MCV 73.1 (*)    MCH 23.8 (*)    RDW 16.3 (*)    All other components within normal limits  URINE RAPID DRUG SCREEN (HOSP PERFORMED) - Abnormal; Notable for the following:    Tetrahydrocannabinol POSITIVE (*)    All other components within normal limits  URINALYSIS, ROUTINE W REFLEX MICROSCOPIC - Abnormal; Notable for the following:    APPearance CLOUDY (*)    Glucose, UA 500 (*)    Hgb urine dipstick MODERATE (*)    Protein, ur 100 (*)    All other components within normal limits  URINE MICROSCOPIC-ADD ON - Abnormal; Notable for the following:    Squamous Epithelial / LPF MANY (*)    Bacteria, UA FEW (*)    All other components within normal limits  GLUCOSE, CAPILLARY - Abnormal; Notable for the following:    Glucose-Capillary 244 (*)    All other components within normal limits  TROPONIN I  BRAIN NATRIURETIC PEPTIDE  PREGNANCY, URINE  TROPONIN I  TROPONIN I  TROPONIN I  HEMOGLOBIN A1C  TSH  LIPID PANEL    Imaging Review Dg Chest 2 View  08/28/2014   CLINICAL DATA:  Right chest and back pain 3 days after a fall.  EXAM: CHEST  2 VIEW  COMPARISON:  September 03, 2013  FINDINGS: The heart size and mediastinal contours are stable. The heart size is enlarged. The aorta is tortuous. There is mild chronic scar of the left mid lung unchanged. There is no focal pneumonia, pulmonary edema, or pleural effusion. The visualized skeletal structures are stable.  IMPRESSION:  No acute cardiopulmonary disease identified. Cardiomegaly. Chronic scar of left lung.   Electronically Signed   By: Abelardo Diesel M.D.   On: 08/28/2014 12:37     EKG Interpretation   Date/Time:  Friday August 28 2014 11:15:13 EST Ventricular Rate:  92 PR Interval:  178 QRS Duration: 80 QT Interval:  383 QTC Calculation: 474 R Axis:   -32 Text Interpretation:  Sinus rhythm Left ventricular hypertrophy Anterior Q  waves, possibly due to LVH Abnormal T, consider ischemia, lateral leads No  significant  change was found Confirmed by CAMPOS  MD, Lennette Bihari ([id number]) on  08/28/2014 12:49:40 PM       11:41 AM Discussed pt with Dr Venora Maples.   1:06 PM Discussed pt with Dr Mare Ferrari who requests medical admission.  Will consult if admitting team calls to request it.  He has reviewed EKG and thinks this looks more like LVH with strain.   1:12 PM Spoke with Family practice who will admit the patient.    MDM   Final diagnoses:  Chest pain, unspecified chest pain type  Essential hypertension  Hyperglycemia    Afebrile nontoxic patient with untreated HTN, HL, DM p/w central chest pain ongoing since 1am.  Associated SOB, nausea.  EKG changes with new lateral T wave depressions.  Troponin negative.  Labs unremarkable.  CXR negative.   Please see discussion above regarding cardiology consultation and family practice admission.  Records received from [facility] with noted 100% occlusion of proximal right coronary artery that was not stented.  Paperwork given to admitting team for further review.  Admitted for further evaluation and monitoring of chest pain.      Clayton Bibles, PA-C 08/28/14 [place], MD 08/28/14 [phone]

## 2014-08-28 NOTE — ED Notes (Signed)
Per EMS, pt picked up from [facility] with c/o 9/10 central chest pain, non radiating with c/o SOB. Pt has a h/o angina. Pt A&OX4, NAD noted. Pt given 324 mg ASA and 1 nitro tab with no relief. BP elevated, pt has been without meds.

## 2014-08-29 DIAGNOSIS — R072 Precordial pain: Secondary | ICD-10-CM

## 2014-08-29 DIAGNOSIS — I359 Nonrheumatic aortic valve disorder, unspecified: Secondary | ICD-10-CM

## 2014-08-29 LAB — GLUCOSE, CAPILLARY
Glucose-Capillary: 180 mg/dL — ABNORMAL HIGH (ref 70–99)
Glucose-Capillary: 250 mg/dL — ABNORMAL HIGH (ref 70–99)
Glucose-Capillary: 263 mg/dL — ABNORMAL HIGH (ref 70–99)

## 2014-08-29 MED ORDER — LISINOPRIL-HYDROCHLOROTHIAZIDE 20-12.5 MG PO TABS
1.0000 | ORAL_TABLET | Freq: Every day | ORAL | Status: DC
Start: 2014-08-29 — End: 2014-09-11

## 2014-08-29 MED ORDER — METFORMIN HCL 1000 MG PO TABS: 1000.0000 mg | ORAL_TABLET | Freq: Two times a day (BID) | ORAL | Status: DC

## 2014-08-29 MED ORDER — AMLODIPINE BESYLATE 10 MG PO TABS: ORAL_TABLET | ORAL | Status: DC

## 2014-08-29 MED ORDER — LOVASTATIN 40 MG PO TABS: 40.0000 mg | ORAL_TABLET | Freq: Every day | ORAL | Status: DC

## 2014-08-29 NOTE — Clinical Social Work Psychosocial (Signed)
Clinical Social Work Department BRIEF PSYCHOSOCIAL ASSESSMENT 08/29/2014  Patient:  Diamond Rice, Diamond Rice     Account Number:  [id number]     Admit date:  08/28/2014  Clinical Social Worker:  Lovey Newcomer  Date/Time:  08/29/2014 12:49 PM  Referred by:  Physician  Date Referred:  08/29/2014 Referred for  Homelessness   Other Referral:   NA   Interview type:  Patient Other interview type:   Patient alert and oriented at time of assessment.    PSYCHOSOCIAL DATA Living Status:  ALONE Admitted from facility:   Level of care:   Primary support name:  Rocky Primary support relationship to patient:  SIBLING Degree of support available:   Support is poor.    CURRENT CONCERNS Current Concerns  Other - See comment   Other Concerns:   Homeless, and problems accessing meds.    SOCIAL WORK ASSESSMENT / PLAN CSW met with patient at bedside to complete assessment. Patient states that she has been living on the streets and staying with people "here and there" to get by. Patient reports that she does have difficulty getting to doctors appointments and paying for medications. Patient asks for bus passes. CSW will assist with transportation when patient is ready for DC. Patient reports that has not stayed at the shelters in the area but did accept list of shelter resources provided by CSW. Patient reports that her living family members live on the [place] side of [place] and include her siblings. She states that the sibilings do not really stay in touch after her parents passed. Patient appeared tired, but did make effort to engage in assessment. No other CSW needs identified.   Assessment/plan status:  No Further Intervention Required Other assessment/ plan:   NONE   Information/referral to community resources:   CSW provided patient with shelter list and information on the [employer].    PATIENT'S/FAMILY'S RESPONSE TO PLAN OF CARE: Patient plans to utilize  resources provided. CSW signing off at this time.       Liz Beach MSW, [place], [place], [phone]

## 2014-08-29 NOTE — Discharge Summary (Signed)
[facility] Discharge Summary  Patient name: Katessa Attridge Medical record number: [id number] Date of birth: [birth year] Age: 44 y.o. Gender: female Date of Admission: 08/28/2014  Date of Discharge: 08/29/14 Admitting Physician: Dickie La, MD  Primary Care Provider: Janora Norlander, DO Consultants: cardiology  Indication for Hospitalization: Chest pain  Discharge Diagnoses/Problem List:  Chest pain Dyspnea Hypertension Type 2 diabetes Hyperlipidemia Depression/anxiety Homelessness  Disposition: Home  Discharge Condition: Stable  Discharge Exam:  General: NAD Cardiovascular: RRR, 2/6 systolic crescendo murmur loudest at RUSB Respiratory: CTAB, NWOB Abdomen: soft, NTTP Extremities: No appreciable lower extremity edema bilaterally   Brief Hospital Course:  Tameko Halder is a 44 year old female admitted to the hospital after presenting with chest pain and shortness of breath.  Chest pain: Admitted, monitored on telemetry. Troponins were negative 4. Continued on aspirin daily. Echocardiogram done and showed normal systolic function, with some diastolic dysfunction. See full echo findings below. Cardiology was consulted and did not feel patient needed further workup.   Dyspnea: Chest x-ray was benign. This resolved spontaneously after admission. Echo findings as below.  Hypertension: Significantly elevated blood pressures on admission, improved with restarting patient's home medications. Elevated blood pressures were likely secondary to medication noncompliance.  Type 2 diabetes: Given sliding scale insulin while inpatient  Hyperlipidemia: Continued on lovastatin 40 mg. Would have liked to give higher intensity statin given known coronary artery disease, however cost is a significant issue for patient so we left her on her home lovastatin.  Homelessness: Social work saw patient and offered resources for homelessness. She was also given  information about [facility], as she stated she no longer wanted to follow-up with [facility].  Issues for Follow Up:  -Ensure has stable housing and access to medications -Monitor for recurrence of chest pain -Monitor volume status given diastolic dysfunction on echo -If able, initiate higher intensity statin therapy -Needs to establish care with new PCP at [facility]  Significant Procedures: none  Significant Labs and Imaging:   Recent Labs Lab 08/28/14 1131  WBC 8.0  HGB 12.0  HCT 36.9  PLT 335    Recent Labs Lab 08/28/14 1131  NA 139  K 3.7  CL 105  CO2 23  GLUCOSE 239*  BUN 13  CREATININE 0.99  CALCIUM 9.3  ALKPHOS 67  AST 20  ALT 11  ALBUMIN 3.1*   TSH normal Lipids: LDL 125, HDL 45, total 196  CXR 1/1 No acute cardiopulmonary disease identified. Cardiomegaly. Chronic scar of left lung.  Results/Tests Pending at Time of Discharge: none  Discharge Medications:    Medication List    TAKE these medications        acetaminophen 325 MG tablet  Commonly known as:  TYLENOL  Take 650 mg by mouth every 6 (six) hours as needed for headache.     amLODipine 10 MG tablet  Commonly known as:  NORVASC  TAKE 1 TABLET BY MOUTH DAILY FOR HYPERTENSION     aspirin 81 MG EC tablet  Take 1 tablet (81 mg total) by mouth daily.     glipiZIDE 10 MG tablet  Commonly known as:  GLUCOTROL  Take 1 tablet (10 mg total) by mouth 2 (two) times daily before a meal.     ibuprofen 200 MG tablet  Commonly known as:  ADVIL,MOTRIN  Take 800 mg by mouth 2 (two) times daily as needed (pain).     lisinopril-hydrochlorothiazide 20-12.5 MG  per tablet  Commonly known as:  ZESTORETIC  Take 1 tablet by mouth daily.     lovastatin 40 MG tablet  Commonly known as:  MEVACOR  Take 1 tablet (40 mg total) by mouth at bedtime.     metFORMIN 1000 MG tablet  Commonly known as:  GLUCOPHAGE  Take 1 tablet (1,000 mg  total) by mouth 2 (two) times daily with a meal.        Discharge Instructions: Please refer to Patient Instructions section of EMR for full details.  Patient was counseled important signs and symptoms that should prompt return to medical care, changes in medications, dietary instructions, activity restrictions, and follow up appointments.   Follow-Up Appointments: Follow-up Information    Follow up with [facility]    .   Why:  Go to the clinic on Monday Morning 1.4.2016 with your brochure.   Contact information:   [place] [id number] [place], MD 09/02/2014, 6:02 PM PGY-3, [place]

## 2014-08-29 NOTE — Progress Notes (Signed)
Family Medicine Teaching Service Daily Progress Note Intern Pager: [phone]  [name] Medical record number: [id number] Date of birth: 25-Oct-1970 Age: 44 y.o. Gender: female  Primary Care Provider: Janora Norlander, DO Consultants: cardiology Code Status: full  Pt Overview and Major Events to Date:  1/1 admit with CP & SOB  Assessment and Plan: Diamond Rice is a 44 y.o. female presenting with chest pain and shortness of breath. PMH is significant for hypertension, type 2 diabetes, hyperlipidemia, migraines, depression, anxiety, sleep apnea, morbid obesity.   #Chest pain: Non reproducible chest pain & dyspnea prior to admission with significant family hx, multiple risk factors including HTN, HLD, T2DM. Medication noncompliance secondary to cost. Hx of cath in 2012 in [place], showing 100% occlusion proximal RCA as well as multiple other vessels with some disease. Negative myoview in Aug 2013 here at [facility]. Chest pain currently resolved. - ACS ruled out with trop neg x 4 - Cardiology consulted: Appreciate recs. State no further need for inpt cardiac workup. - EKG showing new lateral T-wave depressions, cards believes EKG changes 2/2 HTN and LV strain. Repeat EKG this AM appears similar.  - ASA 81 mg daily, dilaudid 1 mg q4h prn pain - Supplemental oxygen prn, keep patient > 92% O2 sat - Echocardiogram ordered, needs this done prior to discharge - TSH normal, lipids: LDL 125, HDL 45, total 196  #Dyspnea: Likely cardiac etiology. Chest x-ray benign for any acute processes. No current dyspnea. - Continue to monitor respirations and O2 saturation. - await echo  #Hypertension: significantly elevated BP's on admit (up to 206/119), improved now but still elevated above goal - amlodipine 10mg  daily (increased from 5mg  daily this morning), continue HCTZ 12.5mg  daily & lisinopril 20mg  daily - hydralazine 10mg  q6hrs PRN for BP >180/100  #Type 2 diabetes - Holding  home metformin and glipizide - SSI - increase to moderate due to all sugars >180  #Hyperlipidemia - Lovastatin 40 mg, home dose - would ideally like higher intensity statin due to clinically significant CV disease (known 100% RCA occlusion) but suspect cost will be an issue so will not change for now  #Depression/anxiety - Monitor, no home medications for this. - Mood stable  #Social: patient stating non-compliance because of inability to pay for medication - CM consult for medication needs - pt planning to f/u with [facility]. States she no longer wants to f/u with [facility].  FEN/GI: SLIV; heart healthy/carb modified diet Prophylaxis: Subcutaneous heparin  Disposition: Needs echo prior to d/c. Anticipate possible d/c today.  Subjective:  Feeling better. No chest pain or shortness of breath. Tolerating PO.  Objective: Temp:  [97.5 F (36.4 C)-98.4 F (36.9 C)] 98 F (36.7 C) (01/02 0500) Pulse Rate:  [71-91] 74 (01/02 0500) Resp:  [15-20] 18 (01/02 0500) BP: (147-206)/(89-119) 154/91 mmHg (01/02 0500) SpO2:  [97 %-100 %] 100 % (01/02 0500) Weight:  [231 lb 1.6 oz (104.826 kg)-235 lb (106.595 kg)] 231 lb 1.6 oz (104.826 kg) (01/01 1418) Physical Exam: General: NAD Cardiovascular: RRR, 2/6 systolic crescendo murmur loudest at RUSB Respiratory: CTAB, NWOB Abdomen: soft, NTTP Extremities: No appreciable lower extremity edema bilaterally   Laboratory:  Recent Labs Lab 08/28/14 1131  WBC 8.0  HGB 12.0  HCT 36.9  PLT 335    Recent Labs Lab 08/28/14 1131  NA 139  K 3.7  CL 105  CO2 23  BUN 13  CREATININE 0.99  CALCIUM 9.3  PROT 7.4  BILITOT 0.6  ALKPHOS 67  ALT 11  AST 20  GLUCOSE 239*   Imaging/Diagnostic Tests: CXR 1/1 No acute cardiopulmonary disease identified. Cardiomegaly. Chronic scar of left lung.   Diamond Rio, MD 08/29/2014, 8:53 AM PGY-3, [place] Intern pager:  [phone], text pages welcome

## 2014-08-29 NOTE — Progress Notes (Signed)
*  PRELIMINARY RESULTS* Echocardiogram 2D Echocardiogram has been performed.  Leavy Cella 08/29/2014, 12:40 PM

## 2014-08-29 NOTE — Progress Notes (Signed)
Patient ID: Diamond Rice, female   DOB: [birth year], 44 y.o.   MRN: [id number]    Subjective:  Denies SSCP, palpitations or Dyspnea   Objective:  Filed Vitals:   08/28/14 1418 08/28/14 1618 08/28/14 2021 08/29/14 0500  BP: 206/119 166/89 154/91 154/91  Pulse: 74 73 81 74  Temp: 98 F (36.7 C) 97.5 F (36.4 C) 98.3 F (36.8 C) 98 F (36.7 C)  TempSrc: Oral Oral Oral Oral  Resp: 18 17 18 18   Height: 5\' 1"  (1.549 m)     Weight: 104.826 kg (231 lb 1.6 oz)     SpO2: 97% 100% 100% 100%    Intake/Output from previous day:  Intake/Output Summary (Last 24 hours) at 08/29/14 0815 Last data filed at 08/28/14 1730  Gross per 24 hour  Intake    480 ml  Output      0 ml  Net    480 ml    Physical Exam: Affect appropriate Obese black female HEENT: normal Neck supple with no adenopathy JVP normal no bruits no thyromegaly Lungs clear with no wheezing and good diaphragmatic motion Heart:  S1/S2 no murmur, no rub, gallop or click PMI normal Abdomen: benighn, BS positve, no tenderness, no AAA no bruit.  No HSM or HJR Distal pulses intact with no bruits Plus one bilateral  edema Neuro non-focal Skin warm and dry No muscular weakness   Lab Results: Basic Metabolic Panel:  Recent Labs  08/28/14 1131  NA 139  K 3.7  CL 105  CO2 23  GLUCOSE 239*  BUN 13  CREATININE 0.99  CALCIUM 9.3   Liver Function Tests:  Recent Labs  08/28/14 1131  AST 20  ALT 11  ALKPHOS 67  BILITOT 0.6  PROT 7.4  ALBUMIN 3.1*   CBC:  Recent Labs  08/28/14 1131  WBC 8.0  NEUTROABS 4.4  HGB 12.0  HCT 36.9  MCV 73.1*  PLT 335   Cardiac Enzymes:  Recent Labs  08/28/14 1605 08/28/14 1709 08/28/14 2026  TROPONINI <0.03 <0.03 <0.03   Fasting Lipid Panel:  Recent Labs  08/28/14 1605  CHOL 196  HDL 45  LDLCALC 125*  TRIG 129  CHOLHDL 4.4   Thyroid Function Tests:  Recent Labs  08/28/14 1709  TSH 0.561    Imaging: Dg Chest 2 View  08/28/2014   CLINICAL DATA:   Right chest and back pain 3 days after a fall.  EXAM: CHEST  2 VIEW  COMPARISON:  September 03, 2013  FINDINGS: The heart size and mediastinal contours are stable. The heart size is enlarged. The aorta is tortuous. There is mild chronic scar of the left mid lung unchanged. There is no focal pneumonia, pulmonary edema, or pleural effusion. The visualized skeletal structures are stable.  IMPRESSION: No acute cardiopulmonary disease identified. Cardiomegaly. Chronic scar of left lung.   Electronically Signed   By: Abelardo Diesel M.D.   On: 08/28/2014 12:37    Cardiac Studies:  ECG:  SR LVH with strain    Telemetry:  NSR no VT   Echo: pending   Medications:   . amLODipine  10 mg Oral Daily  . aspirin EC  81 mg Oral Daily  . heparin  5,000 Units Subcutaneous 3 times per day  . hydrochlorothiazide  12.5 mg Oral Daily  . Influenza vac split quadrivalent PF  0.5 mL Intramuscular Tomorrow-1000  . insulin aspart  0-5 Units Subcutaneous QHS  . insulin aspart  0-9 Units Subcutaneous TID WC  .  lisinopril  20 mg Oral Daily  . pravastatin  40 mg Oral q1800     . sodium chloride 125 mL/hr at 08/28/14 1545    Assessment/Plan:  Chest Pain: atypical r/o ECG with LVH no further inpatient studies planned HTN:  Improved non compliant with meds DM:  Continue insulin needs better home regimen and close outpatient f/u   Chol:  On statin not been compliant Drug screen positive for [facility] patient does not want to discuss  Ok to d/c once medical regimen sorted and has good primary care f/u No need for cardiology f/u at this time   Jenkins Rouge 08/29/2014, 8:15 AM

## 2014-08-29 NOTE — Progress Notes (Signed)
Met patient at bedside role of Case Manager explained.Patient verbalizes her understanding.Patient reports she has no Health Insurance or PCP. Patient provided with a brochure for the [facility] wellness [facility] and will establish a new patient follow up appointment next week.Patient educated she can receive Medication and Insurance assistance at the clinic.No further CM needs at this time. 

## 2014-08-29 NOTE — Discharge Instructions (Signed)
You were admitted to the hospital with chest pain. You had several tests done, which did not suggest that the pain was coming from your heart.  You will need to follow up with the [facility] to establish care and to follow up on your blood pressure and diabetes.  What to do after you leave the hospital: Recommended diet: cardiac diet Recommended activity: activity as tolerated  Please seek medical attention if you experience any chest pain, shortness of breath, fevers, vomiting, or any other concerns.

## 2014-08-29 NOTE — Progress Notes (Deleted)
Met patient at patient at bedside role of case manager explained.Patient demographics verified in Epic.Patient reports she has no Insurance or a PCP.Patient educated on importance of establishing a PCP and health Insurance and provided with a brochure for the [facility] health wellness center.Patient educated she can obtain a PCP follow up appointment,Insurance and medication assistance.Patient reports her understanding.No further CM needs.

## 2014-08-30 LAB — HEMOGLOBIN A1C
Hgb A1c MFr Bld: 10.3 % — ABNORMAL HIGH (ref <5.7)
Mean Plasma Glucose: 249 mg/dL — ABNORMAL HIGH (ref <117)

## 2014-09-08 ENCOUNTER — Inpatient Hospital Stay (HOSPITAL_COMMUNITY): Payer: Self-pay

## 2014-09-08 ENCOUNTER — Inpatient Hospital Stay (HOSPITAL_COMMUNITY)
Admission: EM | Admit: 2014-09-08 | Discharge: 2014-09-11 | DRG: 065 | Disposition: A | Discharge status: Rehabilitation Facility | Payer: Self-pay | Attending: Family Medicine | Admitting: Family Medicine

## 2014-09-08 ENCOUNTER — Encounter (HOSPITAL_COMMUNITY): Payer: Self-pay | Admitting: Family Medicine

## 2014-09-08 ENCOUNTER — Emergency Department (HOSPITAL_COMMUNITY): Payer: Self-pay

## 2014-09-08 DIAGNOSIS — F1290 Cannabis use, unspecified, uncomplicated: Secondary | ICD-10-CM | POA: Diagnosis present

## 2014-09-08 DIAGNOSIS — I739 Peripheral vascular disease, unspecified: Secondary | ICD-10-CM | POA: Diagnosis present

## 2014-09-08 DIAGNOSIS — G8194 Hemiplegia, unspecified affecting left nondominant side: Secondary | ICD-10-CM | POA: Diagnosis present

## 2014-09-08 DIAGNOSIS — E785 Hyperlipidemia, unspecified: Secondary | ICD-10-CM | POA: Diagnosis present

## 2014-09-08 DIAGNOSIS — E876 Hypokalemia: Secondary | ICD-10-CM | POA: Diagnosis not present

## 2014-09-08 DIAGNOSIS — Z7982 Long term (current) use of aspirin: Secondary | ICD-10-CM

## 2014-09-08 DIAGNOSIS — Z6841 Body Mass Index (BMI) 40.0 and over, adult: Secondary | ICD-10-CM

## 2014-09-08 DIAGNOSIS — Z791 Long term (current) use of non-steroidal anti-inflammatories (NSAID): Secondary | ICD-10-CM

## 2014-09-08 DIAGNOSIS — R29810 Facial weakness: Secondary | ICD-10-CM | POA: Diagnosis present

## 2014-09-08 DIAGNOSIS — Z79899 Other long term (current) drug therapy: Secondary | ICD-10-CM

## 2014-09-08 DIAGNOSIS — E1151 Type 2 diabetes mellitus with diabetic peripheral angiopathy without gangrene: Secondary | ICD-10-CM | POA: Diagnosis present

## 2014-09-08 DIAGNOSIS — G4733 Obstructive sleep apnea (adult) (pediatric): Secondary | ICD-10-CM | POA: Diagnosis present

## 2014-09-08 DIAGNOSIS — E1165 Type 2 diabetes mellitus with hyperglycemia: Secondary | ICD-10-CM | POA: Diagnosis present

## 2014-09-08 DIAGNOSIS — G43909 Migraine, unspecified, not intractable, without status migrainosus: Secondary | ICD-10-CM | POA: Diagnosis present

## 2014-09-08 DIAGNOSIS — F17210 Nicotine dependence, cigarettes, uncomplicated: Secondary | ICD-10-CM | POA: Diagnosis present

## 2014-09-08 DIAGNOSIS — R0600 Dyspnea, unspecified: Secondary | ICD-10-CM

## 2014-09-08 DIAGNOSIS — F410 Panic disorder [episodic paroxysmal anxiety] without agoraphobia: Secondary | ICD-10-CM | POA: Diagnosis present

## 2014-09-08 DIAGNOSIS — Z885 Allergy status to narcotic agent status: Secondary | ICD-10-CM

## 2014-09-08 DIAGNOSIS — R471 Dysarthria and anarthria: Secondary | ICD-10-CM | POA: Diagnosis present

## 2014-09-08 DIAGNOSIS — Z9114 Patient's other noncompliance with medication regimen: Secondary | ICD-10-CM | POA: Diagnosis present

## 2014-09-08 DIAGNOSIS — R1310 Dysphagia, unspecified: Secondary | ICD-10-CM | POA: Diagnosis present

## 2014-09-08 DIAGNOSIS — I10 Essential (primary) hypertension: Secondary | ICD-10-CM | POA: Diagnosis present

## 2014-09-08 DIAGNOSIS — I639 Cerebral infarction, unspecified: Principal | ICD-10-CM | POA: Diagnosis present

## 2014-09-08 DIAGNOSIS — Z823 Family history of stroke: Secondary | ICD-10-CM

## 2014-09-08 LAB — CBC WITH DIFFERENTIAL/PLATELET
BASOS ABS: 0.1 10*3/uL (ref 0.0–0.1)
BASOS PCT: 1 % (ref 0–1)
Eosinophils Absolute: 0.3 10*3/uL (ref 0.0–0.7)
Eosinophils Relative: 3 % (ref 0–5)
HCT: 38.8 % (ref 36.0–46.0)
HEMOGLOBIN: 12.4 g/dL (ref 12.0–15.0)
Lymphocytes Relative: 36 % (ref 12–46)
Lymphs Abs: 3.7 10*3/uL (ref 0.7–4.0)
MCH: 23.4 pg — ABNORMAL LOW (ref 26.0–34.0)
MCHC: 32 g/dL (ref 30.0–36.0)
MCV: 73.2 fL — ABNORMAL LOW (ref 78.0–100.0)
Monocytes Absolute: 0.8 10*3/uL (ref 0.1–1.0)
Monocytes Relative: 8 % (ref 3–12)
NEUTROS ABS: 5.5 10*3/uL (ref 1.7–7.7)
NEUTROS PCT: 52 % (ref 43–77)
Platelets: 361 10*3/uL (ref 150–400)
RBC: 5.3 MIL/uL — ABNORMAL HIGH (ref 3.87–5.11)
RDW: 15.7 % — ABNORMAL HIGH (ref 11.5–15.5)
WBC: 10.4 10*3/uL (ref 4.0–10.5)

## 2014-09-08 LAB — COMPREHENSIVE METABOLIC PANEL
ALBUMIN: 3.1 g/dL — AB (ref 3.5–5.2)
ALT: 11 U/L (ref 0–35)
AST: 16 U/L (ref 0–37)
Alkaline Phosphatase: 70 U/L (ref 39–117)
Anion gap: 8 (ref 5–15)
BUN: 7 mg/dL (ref 6–23)
CALCIUM: 8.9 mg/dL (ref 8.4–10.5)
CHLORIDE: 102 meq/L (ref 96–112)
CO2: 26 mmol/L (ref 19–32)
Creatinine, Ser: 0.9 mg/dL (ref 0.50–1.10)
GFR calc Af Amer: >90 mL/min (ref >90)
GFR, EST NON AFRICAN AMERICAN: 78 mL/min — AB (ref >90)
Glucose, Bld: 225 mg/dL — ABNORMAL HIGH (ref 70–99)
Potassium: 3.5 mmol/L (ref 3.5–5.1)
Sodium: 136 mmol/L (ref 135–145)
Total Bilirubin: 0.5 mg/dL (ref 0.3–1.2)
Total Protein: 7.1 g/dL (ref 6.0–8.3)

## 2014-09-08 LAB — ANTITHROMBIN III: ANTITHROMB III FUNC: 112 % (ref 75–120)

## 2014-09-08 LAB — GLUCOSE, CAPILLARY
GLUCOSE-CAPILLARY: 292 mg/dL — AB (ref 70–99)
Glucose-Capillary: 262 mg/dL — ABNORMAL HIGH (ref 70–99)

## 2014-09-08 LAB — TROPONIN I

## 2014-09-08 MED ORDER — STROKE: EARLY STAGES OF RECOVERY BOOK
Freq: Once | Status: AC
  Administered 2014-09-08: 15:00:00

## 2014-09-08 MED ORDER — LABETALOL HCL 5 MG/ML IV SOLN
10.0000 mg | INTRAVENOUS | Status: DC | PRN
  Administered 2014-09-08 (×4): 10 mg via INTRAVENOUS
  Filled 2014-09-08 (×3): qty 4

## 2014-09-08 MED ORDER — ASPIRIN 325 MG PO TABS
325.0000 mg | ORAL_TABLET | Freq: Every day | ORAL | Status: DC
  Administered 2014-09-08 – 2014-09-11 (×4): 325 mg via ORAL
  Filled 2014-09-08 (×4): qty 1

## 2014-09-08 MED ORDER — LABETALOL HCL 5 MG/ML IV SOLN
20.0000 mg | Freq: Once | INTRAVENOUS | Status: AC
  Administered 2014-09-08: 20 mg via INTRAVENOUS
  Filled 2014-09-08: qty 4

## 2014-09-08 MED ORDER — HYDRALAZINE HCL 20 MG/ML IJ SOLN
10.0000 mg | INTRAMUSCULAR | Status: DC | PRN
  Administered 2014-09-08: 10 mg via INTRAVENOUS
  Filled 2014-09-08: qty 1

## 2014-09-08 MED ORDER — SODIUM CHLORIDE 0.9 % IV SOLN
INTRAVENOUS | Status: DC
  Administered 2014-09-08: 18:00:00 via INTRAVENOUS

## 2014-09-08 MED ORDER — HEPARIN SODIUM (PORCINE) 5000 UNIT/ML IJ SOLN
5000.0000 [IU] | Freq: Three times a day (TID) | INTRAMUSCULAR | Status: DC
  Administered 2014-09-08 – 2014-09-11 (×10): 5000 [IU] via SUBCUTANEOUS
  Filled 2014-09-08 (×10): qty 1

## 2014-09-08 MED ORDER — LABETALOL HCL 5 MG/ML IV SOLN: 10.0000 mg | INTRAVENOUS | Status: DC | PRN

## 2014-09-08 MED ORDER — LISINOPRIL 20 MG PO TABS
20.0000 mg | ORAL_TABLET | Freq: Every day | ORAL | Status: DC
Start: 2014-09-08 — End: 2014-09-11
  Administered 2014-09-08 – 2014-09-11 (×4): 20 mg via ORAL
  Filled 2014-09-08 (×4): qty 1

## 2014-09-08 MED ORDER — INSULIN ASPART 100 UNIT/ML ~~LOC~~ SOLN
0.0000 [IU] | Freq: Three times a day (TID) | SUBCUTANEOUS | Status: DC
  Administered 2014-09-08: 8 [IU] via SUBCUTANEOUS
  Administered 2014-09-09: 3 [IU] via SUBCUTANEOUS
  Administered 2014-09-09 – 2014-09-10 (×2): 5 [IU] via SUBCUTANEOUS
  Administered 2014-09-10: 8 [IU] via SUBCUTANEOUS
  Administered 2014-09-10: 3 [IU] via SUBCUTANEOUS
  Administered 2014-09-11: 5 [IU] via SUBCUTANEOUS
  Administered 2014-09-11: 3 [IU] via SUBCUTANEOUS

## 2014-09-08 MED ORDER — AMLODIPINE BESYLATE 10 MG PO TABS
10.0000 mg | ORAL_TABLET | Freq: Every day | ORAL | Status: DC
Start: 2014-09-08 — End: 2014-09-11
  Administered 2014-09-08 – 2014-09-11 (×4): 10 mg via ORAL
  Filled 2014-09-08 (×4): qty 1

## 2014-09-08 MED ORDER — SENNOSIDES-DOCUSATE SODIUM 8.6-50 MG PO TABS: 1.0000 | ORAL_TABLET | Freq: Every evening | ORAL | Status: DC | PRN

## 2014-09-08 MED ORDER — LORAZEPAM 2 MG/ML IJ SOLN
0.5000 mg | Freq: Once | INTRAMUSCULAR | Status: AC
  Administered 2014-09-08: 0.5 mg via INTRAVENOUS
  Filled 2014-09-08: qty 1

## 2014-09-08 MED ORDER — INSULIN ASPART 100 UNIT/ML ~~LOC~~ SOLN
0.0000 [IU] | Freq: Every day | SUBCUTANEOUS | Status: DC
  Administered 2014-09-08: 3 [IU] via SUBCUTANEOUS
  Administered 2014-09-09: 2 [IU] via SUBCUTANEOUS
  Administered 2014-09-10: 3 [IU] via SUBCUTANEOUS

## 2014-09-08 MED ORDER — ATORVASTATIN CALCIUM 40 MG PO TABS
40.0000 mg | ORAL_TABLET | Freq: Every day | ORAL | Status: DC
  Administered 2014-09-08 – 2014-09-10 (×3): 40 mg via ORAL
  Filled 2014-09-08 (×3): qty 1

## 2014-09-08 NOTE — Progress Notes (Signed)
Family Practice Teaching Service Interval Progress Note  Stopped by to check on patient. She's doing well. Still experiencing L sided weakness and facial droop. Has eaten some dinner (passed stroke swallow screen). I shared with her that her MRI did show an acute stroke. She seemed to take this news pretty well. 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  Her BP's have been high in the 553Z-482'L systolic. We've been holding her home PO antihypertensives to allow permissive HTN. She is now almost to 24 hours after the onset of her symptoms. BP's high despite IV labetalol. Will order hydralazine 10mg  IV x1, also will dose pt's home amlodipine 10mg  daily now. Continue to monitor BP's and will plan to resume full home antihypertensives tomorrow morning (or sooner if BP's do not improve).  Chrisandra Netters, MD Family Medicine PGY-3 Service Pager [phone]

## 2014-09-08 NOTE — H&P (Signed)
[facility] Admission History and Physical Service Pager: [phone]  [name] Medical record number: [id number] Date of birth: [birth year] Age: 44 y.o. Gender: female  Primary Care Provider: Janora Norlander, DO Consultants: neuro Code Status: full  Chief Complaint: L-sided weakness and slurred speech  Assessment and Plan: Diamond Rice is a 44 y.o. female presenting with L-sided weakness and slurred speech for the past 16 hours. PMH is significant for DM, HTN, HLD, substance abuse and recent chest pain admission.  # Weakness, slurred speech - likely CVA. Risk factors include DM, HTN, HLD, strong family history (2 brothers with strokes in their 65s). CT neg in ED. Neuro consulted in ED. - MRI/MRA brain ordered - carotid duplex ordered - echo 08/29/14 - EF 01-02%, LV diastolic dysfunction, moderate LA dilation, no ASD/PFO - A1c and lipids done 08/28/14 - will not repeat - PT/OT/SLP consulted - ASA 325 per neuro - neuro consulting, appreciate recs  # DM: A1c 10.3 on 08/28/14 - hold orals, patient not taking at home due to cost - moderate SSI  # HTN: Poorly controlled - hold meds, patient not taking at home due to cost - will allow permissive pressures in setting of likely ischemic stroke - labetalol prn BP >180/110   # HLD: lipid panel 08/28/14 showed LDL 125, HDL 45 and total cholesterol 196 - will increase to high intensity statin  - atorva 40 daily  # Social: Patient is uninsured and has been noncompliant with meds due to cost - consult CM for medication assistance  FEN/GI: NPO pending swallow screen Prophylaxis: SQ heparin  Disposition: Admit to tele floor, FMTS, Dr. Ree Kida attending  History of Present Illness: Diamond Rice is a 44 y.o. female presenting with left-sided weakness and slurred speech starting last night. The patient was in her usual state of health until last night at about 8:30pm when she noticed that  her left arm was slightly weak, she didn't think much of it and went to sleep. This morning she woke up with a headache and then noticed that she was unable to grip things with her left hand, her left leg was not moving well and she felt like she was dragging it and her speech was slurred. Her face felt twisted and a friend noted that her face was not moving right.   Review Of Systems: Per HPI with the following additions: She denies CP.  Otherwise 12 point review of systems was performed and was unremarkable.  Patient Active Problem List   Diagnosis Date Noted  . CVA (cerebral vascular accident) 09/08/2014  . Pain in the chest   . Essential hypertension   . Type 2 diabetes mellitus with complication   . Back pain 09/11/2013  . Psychoactive substance-induced organic mood disorder 05/28/2013  . Cocaine abuse with cocaine-induced mood disorder 05/28/2013  . Cannabis dependence with cannabis-induced anxiety disorder 05/28/2013  . Morbid obesity with BMI of 45.0-49.9, adult 04/25/2012  . Chest pain 04/17/2012  . Hypertension 04/17/2012  . DM (diabetes mellitus), type 2, uncontrolled 04/17/2012  . Hyperlipidemia 04/17/2012  . Tobacco use 04/17/2012   Past Medical History: Past Medical History  Diagnosis Date  . Hypertension   . Type II diabetes mellitus   . Anginal pain   . High cholesterol   . Shortness of breath 04/17/2012    "all the time"  . Headache(784.0) 04/17/2012    "~ qod; lately waking up in am w/one"  . Migraines 04/17/2012  .  Chronic lower back pain 04/17/2012    "just got over some; catched when I walked"  . Depression   . Anxiety   . Sleep apnea   . Obesity    Past Surgical History: Past Surgical History  Procedure Laterality Date  . Cardiac catheterization  ~ 2011   Social History: History  Substance Use Topics  . Smoking status: Former Smoker -- 0.08 packs/day for 10 years    Types: Cigarettes    Quit date: 08/04/2013  . Smokeless tobacco: Never Used  .  Alcohol Use: Yes     Comment: rare   Additional social history: unable to afford meds Please also refer to relevant sections of EMR.  Family History: Family History  Problem Relation Age of Onset  . Stroke Brother 69  . Stroke Brother 30  . Heart attack Mother 46   Allergies and Medications: Allergies  Allergen Reactions  . Morphine And Related Hives   No current facility-administered medications on file prior to encounter.   Current Outpatient Prescriptions on File Prior to Encounter  Medication Sig Dispense Refill  . ibuprofen (ADVIL,MOTRIN) 200 MG tablet Take 800 mg by mouth 2 (two) times daily as needed (pain).    Marland Kitchen acetaminophen (TYLENOL) 325 MG tablet Take 650 mg by mouth every 6 (six) hours as needed for headache.    Marland Kitchen amLODipine (NORVASC) 10 MG tablet TAKE 1 TABLET BY MOUTH DAILY FOR HYPERTENSION (Patient not taking: Reported on 09/08/2014) 30 tablet 0  . aspirin EC 81 MG EC tablet Take 1 tablet (81 mg total) by mouth daily. (Patient not taking: Reported on 09/08/2014) 30 tablet 0  . glipiZIDE (GLUCOTROL) 10 MG tablet Take 1 tablet (10 mg total) by mouth 2 (two) times daily before a meal. (Patient not taking: Reported on 09/08/2014) 60 tablet 5  . lisinopril-hydrochlorothiazide (ZESTORETIC) 20-12.5 MG per tablet Take 1 tablet by mouth daily. (Patient not taking: Reported on 09/08/2014) 30 tablet 0  . lovastatin (MEVACOR) 40 MG tablet Take 1 tablet (40 mg total) by mouth at bedtime. (Patient not taking: Reported on 09/08/2014) 30 tablet 0  . metFORMIN (GLUCOPHAGE) 1000 MG tablet Take 1 tablet (1,000 mg total) by mouth 2 (two) times daily with a meal. (Patient not taking: Reported on 09/08/2014) 60 tablet 0    Objective: BP 159/94 mmHg  Pulse 78  Temp(Src) 98.1 F (36.7 C) (Oral)  Resp 22  SpO2 99%  LMP 08/28/2014 Exam: General: obese middle-aged woman, lying in bed, NAD HEENT: MMM, oropharynx clear, PERRL, EOMI, left cheek and mouth drooping Cardiovascular: RRR, II/VI SEM,  2+ dp pulses Respiratory: CTAB, NWOB Abdomen: soft, NTND, normal BS Extremities: WWP, no LE edema Skin: intact Neuro: 4/5 strength LUE, 3/5 LLE, decreased sensation of L face/UE/LE, CNII/XII otherwise intact  Labs and Imaging: CBC BMET   Recent Labs Lab 09/08/14 1011  WBC 10.4  HGB 12.4  HCT 38.8  PLT 361    Recent Labs Lab 09/08/14 1011  NA 136  K 3.5  CL 102  CO2 26  BUN 7  CREATININE 0.90  GLUCOSE 225*  CALCIUM 8.9     CT head: No acute intracranial abnormality. Mild chronic microvascular disease. This is advanced for patient's age.    Frazier Richards, MD 09/08/2014, 12:56 PM PGY-2, [place] Intern pager: [phone], text pages welcome

## 2014-09-08 NOTE — ED Provider Notes (Signed)
CSN: [id number]     Arrival date & time 09/08/14  8338 History   First MD Initiated Contact with Patient 09/08/14 [phone]     Chief Complaint  Patient presents with  . Extremity Weakness  . Facial Droop    Diamond Rice is a 44 y.o. female with a history of hypertension, diabetes and hyperlipidemia who presents to emergency department complaining of left facial droop and left-sided weakness that she first noted last night at 2030, or about 12 hours prior to arrival. She reports that last night prior to going to bed she noticed left sided arm weakness around 2030 prior to going to bed at 2130. She woke up this am at 0830 and noticed left facial droop and left leg weakness. Patient was recently admitted on 08/28/2014 for chest pain rule out and blood pressure management. The patient reports that she was prescribed amlodipine and lisinopril-HCTZ for blood pressure control but has not been taking the medicine and she was unable to afford them. The patient reports a family history of 2 brothers in their 90s with strokes and hypertension. The patient denies recent illness or surgery. The patient denies fevers, chills, headache, abdominal pain, numbness, tingling or loss of consciousness.  (Consider location/radiation/quality/duration/timing/severity/associated sxs/prior Treatment) HPI  Past Medical History  Diagnosis Date  . Hypertension   . Type II diabetes mellitus   . Anginal pain   . High cholesterol   . Shortness of breath 04/17/2012    "all the time"  . Headache(784.0) 04/17/2012    "~ qod; lately waking up in am w/one"  . Migraines 04/17/2012  . Chronic lower back pain 04/17/2012    "just got over some; catched when I walked"  . Depression   . Anxiety   . Sleep apnea   . Obesity    Past Surgical History  Procedure Laterality Date  . Cardiac catheterization  ~ 2011   Family History  Problem Relation Age of Onset  . Stroke Brother 82  . Stroke Brother 70  . Heart attack Mother 76    History  Substance Use Topics  . Smoking status: Former Smoker -- 0.08 packs/day for 10 years    Types: Cigarettes    Quit date: 08/04/2013  . Smokeless tobacco: Never Used  . Alcohol Use: Yes     Comment: rare   OB History    No data available     Review of Systems  Constitutional: Negative for fever and chills.  HENT: Negative for congestion, facial swelling, sore throat and trouble swallowing.   Eyes: Negative for pain and visual disturbance.  Respiratory: Negative for cough, shortness of breath and wheezing.   Cardiovascular: Negative for chest pain and palpitations.  Gastrointestinal: Negative for nausea, vomiting, abdominal pain and diarrhea.  Genitourinary: Negative for dysuria.  Musculoskeletal: Negative for back pain and neck pain.  Skin: Negative for rash.  Neurological: Positive for facial asymmetry and weakness. Negative for dizziness, syncope, speech difficulty, numbness and headaches.  All other systems reviewed and are negative.     Allergies  Morphine and related  Home Medications   Prior to Admission medications   Medication Sig Start Date End Date Taking? Authorizing Provider  ibuprofen (ADVIL,MOTRIN) 200 MG tablet Take 800 mg by mouth 2 (two) times daily as needed (pain).   Yes Historical Provider, MD  acetaminophen (TYLENOL) 325 MG tablet Take 650 mg by mouth every 6 (six) hours as needed for headache.    Historical Provider, MD  amLODipine (NORVASC) 10  MG tablet TAKE 1 TABLET BY MOUTH DAILY FOR HYPERTENSION Patient not taking: Reported on 09/08/2014 08/29/14   Leeanne Rio, MD  aspirin EC 81 MG EC tablet Take 1 tablet (81 mg total) by mouth daily. Patient not taking: Reported on 09/08/2014 09/04/13   Olam Idler, MD  glipiZIDE (GLUCOTROL) 10 MG tablet Take 1 tablet (10 mg total) by mouth 2 (two) times daily before a meal. Patient not taking: Reported on 09/08/2014 09/11/13   Gerda Diss, DO  lisinopril-hydrochlorothiazide (ZESTORETIC) 20-12.5  MG per tablet Take 1 tablet by mouth daily. Patient not taking: Reported on 09/08/2014 08/29/14   Leeanne Rio, MD  lovastatin (MEVACOR) 40 MG tablet Take 1 tablet (40 mg total) by mouth at bedtime. Patient not taking: Reported on 09/08/2014 08/29/14   Leeanne Rio, MD  metFORMIN (GLUCOPHAGE) 1000 MG tablet Take 1 tablet (1,000 mg total) by mouth 2 (two) times daily with a meal. Patient not taking: Reported on 09/08/2014 08/29/14   Leeanne Rio, MD   BP 199/116 mmHg  Pulse 82  Temp(Src) 98.4 F (36.9 C) (Oral)  Resp 16  Ht 5\' 1"  (1.549 m)  Wt 226 lb 9.6 oz (102.785 kg)  BMI 42.84 kg/m2  SpO2 98%  LMP 08/28/2014 Physical Exam  Constitutional: She is oriented to person, place, and time. She appears well-developed and well-nourished. No distress.  HENT:  Head: Normocephalic and atraumatic.  Mouth/Throat: Oropharynx is clear and moist. No oropharyngeal exudate.  Left facial droop noted.   Eyes: Conjunctivae and EOM are normal. Pupils are equal, round, and reactive to light. Right eye exhibits no discharge. Left eye exhibits no discharge.  Neck: Normal range of motion. Neck supple.  Cardiovascular: Normal rate, regular rhythm, normal heart sounds and intact distal pulses.  Exam reveals no gallop and no friction rub.   No murmur heard. Pulmonary/Chest: Effort normal and breath sounds normal. No respiratory distress. She has no wheezes. She has no rales.  Abdominal: Soft. There is no tenderness.  Musculoskeletal: She exhibits no edema.  Lymphadenopathy:    She has no cervical adenopathy.  Neurological: She is alert and oriented to person, place, and time. A cranial nerve deficit is present.  Left facial droop noted with unequal smile. Left grip weakness, left pronator drift, left rapid alternating movement diminished. Left arm strength 4/5, right 5/5. Left leg strength 4/5, right 5/5. Left finger to nose weakness. Sensation is intact in her bilateral face, upper and lower  extremities. Speech is clear and coherent.   Skin: Skin is warm and dry. No rash noted. She is not diaphoretic. No erythema. No pallor.  Psychiatric: She has a normal mood and affect. Her behavior is normal.  Nursing note and vitals reviewed.   ED Course  Procedures (including critical care time) Labs Review Labs Reviewed  COMPREHENSIVE METABOLIC PANEL - Abnormal; Notable for the following:    Glucose, Bld 225 (*)    Albumin 3.1 (*)    GFR calc non Af Amer 78 (*)    All other components within normal limits  CBC WITH DIFFERENTIAL - Abnormal; Notable for the following:    RBC 5.30 (*)    MCV 73.2 (*)    MCH 23.4 (*)    RDW 15.7 (*)    All other components within normal limits  ANTITHROMBIN III  PROTEIN C ACTIVITY  PROTEIN C, TOTAL  PROTEIN S ACTIVITY  PROTEIN S, TOTAL  LUPUS ANTICOAGULANT PANEL  BETA-2-GLYCOPROTEIN I ABS, IGG/M/A  HOMOCYSTEINE  FACTOR 5 LEIDEN  PROTHROMBIN GENE MUTATION  CARDIOLIPIN ANTIBODIES, IGG, IGM, IGA  HOMOCYSTEINE    Imaging Review Ct Head Wo Contrast  09/08/2014   CLINICAL DATA:  Left-sided weakness.  Facial droop.  EXAM: CT HEAD WITHOUT CONTRAST  TECHNIQUE: Contiguous axial images were obtained from the base of the skull through the vertex without intravenous contrast.  COMPARISON:  12/23/2013  FINDINGS: Mild scattered microvascular changes in the deep white matter, best seen on the right. No acute intracranial abnormality. Specifically, no hemorrhage, hydrocephalus, mass lesion, acute infarction, or significant intracranial injury. No acute calvarial abnormality. Visualized paranasal sinuses and mastoids clear. Orbital soft tissues unremarkable.  IMPRESSION: No acute intracranial abnormality. Mild chronic microvascular disease. This is advanced for patient's age.   Electronically Signed   By: Rolm Baptise M.D.   On: 09/08/2014 10:49     EKG Interpretation   Date/Time:  Tuesday September 08 2014 09:31:38 EST Ventricular Rate:  99 PR Interval:   174 QRS Duration: 82 QT Interval:  358 QTC Calculation: 459 R Axis:   -32 Text Interpretation:  Normal sinus rhythm Possible Left atrial enlargement  Left axis deviation Left ventricular hypertrophy Abnormal ECG Confirmed by  COOK  MD, BRIAN ([id number]) on 09/08/2014 9:44:39 AM      Filed Vitals:   09/08/14 1300 09/08/14 1330 09/08/14 1331 09/08/14 1340  BP: 146/102 164/88  199/116  Pulse: 86 84  82  Temp:   98.1 F (36.7 C) 98.4 F (36.9 C)  TempSrc:    Oral  Resp: 29 11  16   Height:    5\' 1"  (1.549 m)  Weight:    226 lb 9.6 oz (102.785 kg)  SpO2: 98% 99%  98%     MDM   Final diagnoses:  CVA (cerebral vascular accident)   This is a 44 year old female with history of hypertension, diabetes and hyperlipidemia who presents to the emergency department with left-sided weakness starting at 8:30 last night. Patient reports that last night she noticed some left-sided arm weakness and went to bed. She woke up this morning and noticed left facial droop, left arm weakness and left leg weakness. The patient has left facial droop, left unequal smile. Left grip weakness, left pronator drift, left rapid alternating movements are diminished. Patient's left leg is weak as well. Patient's CMP and CBC are unremarkable. Patient's CT head without contrast indicated no acute intracranial abnormality. It did indicate mild chronic microvascular disease that is advanced for the patient's age. [facility] Dr[facility] [name] about this patient who recommended labetalol 20 mg for the patient's blood pressure. Patient's blood pressure improved to 164/88. Dr. [name] would like general medicine to admit for him to consult. Patient accepted for admission by family medicine resident Dr. Beverlyn Roux, MD. patient is in agreement with admission.  This patient was discussed with and evaluated by Dr. Lacinda Axon who agrees with assessment and plan.    Hanley Hays, PA-C 09/08/14 [place],  MD 09/08/14 [phone]

## 2014-09-08 NOTE — Progress Notes (Signed)
Report recd from [place], ED RN. Will monitor for pt's arrival to unit   Angeline Slim I 09/08/2014 3:45 PM

## 2014-09-08 NOTE — Progress Notes (Signed)
Writer reported to MD inability to bring BP below parameters with Labetalol 10mg  IV X3. MD stated she will order hydralazine.

## 2014-09-08 NOTE — ED Notes (Signed)
Woke up with left facial droop and lt. Hand grip\. Went to bed ~ 2130 on 09/07/14.

## 2014-09-08 NOTE — Progress Notes (Signed)
Pt arrived to unit per stretcher from ED with ED nurse tech. No acute distress noted. Telemetry monitoring initiated. Assessment performed as charted.   Angeline Slim I 09/08/2014 3:46 PM

## 2014-09-08 NOTE — Progress Notes (Addendum)
Family Practice Teaching Service Interval Progress Note  Subjective: Called by RN to report that pt experiencing acute onset shortness of breath. Went to bedside to see patient. Pt with moderately increased WOB and tachypnea. Appears tearful and anxious. Despite sat of 98% on RA, pt was put on 2L [place] by RN. Pt now states that SOB is easing off. Denies having chest pain. She felt anxious prior to the onset of this SOB. Reports hx of panic attacks in the past. Has taken anxiety meds in the past when she was followed by mental health. Pt reports she is stressed out by all that is happening with her right now. A friend is in the room and reports pt had some trouble eating earlier today due to her swallow function.  Objective: BP 190/94 mmHg  Pulse 96  Temp(Src) 99 F (37.2 C) (Oral)  Resp 24  Ht 5\' 1"  (1.549 m)  Wt 226 lb 9.6 oz (102.785 kg)  BMI 42.84 kg/m2  SpO2 99%  LMP 08/28/2014  Gen: NAD, sitting up in bed, tearful HEENT: NCAT, [place] in pace Heart: RRR Lungs: CTAB without crackles or wheezes. bilat breath sounds present. Mild incr WOB. Ext: calves NTTP bilat Psych: anxious appearing  Assessment: 44 yo F with hx HTN, DM, HLD here with acute CVA, now complaining of SOB. This seems most likely anxiety-related at this time. O2 sats normal, bilat breath sounds present so doubt PE or pneumothorax. No chest pain so also doubt ACS, although remains possible. Also possible is aspiration given known CVA with speech difficulty (although pt did pass RN stroke swallow screen)  Plan: -NPO except meds until SLP eval tomorrow -ativan 0.5mg  IV x1 for likely anxiety -trop now, repeat in 6 hours -CXR 2view stat -EKG stat -BP remains elevated despite IV hydralazine and PO amlodipine. Will dose lisinopril 20mg  now as pt is now out of 24 hr window since stroke sx onset. -continue to monitor closely  Chrisandra Netters, MD [facility] Medicine PGY-3 Service Pager [phone]

## 2014-09-08 NOTE — Consult Note (Signed)
Referring Physician: Ree Kida    Chief Complaint: Stroke  HPI:                                                                                                                                         Diamond Rice is an 44 y.o. female presenting to [place] after waking up with left facial droop and left hand grip weakness. On arrival to ED BP 197/119.   Patient went to sleep last night around 21:30. At that time she felt her left arm may have felt funny but not entirely sure. She stats she has been having financial difficulty and has not taken any of her medication over the past weeks including BP, ASA and cholesterol medication. She admits to smoking and drinking occasionally. Upon waking this AM she noted significant weakness and decreased sensation in her left arm and leg along with dysarthria.   Date last known well: Date: 09/07/2014 Time last known well: Time: 21:30 tPA Given: No: out of window  Past Medical History  Diagnosis Date  . Hypertension   . Type II diabetes mellitus   . Anginal pain   . High cholesterol   . Shortness of breath 04/17/2012    "all the time"  . Headache(784.0) 04/17/2012    "~ qod; lately waking up in am w/one"  . Migraines 04/17/2012  . Chronic lower back pain 04/17/2012    "just got over some; catched when I walked"  . Depression   . Anxiety   . Sleep apnea   . Obesity     Past Surgical History  Procedure Laterality Date  . Cardiac catheterization  ~ 2011    No family history on file. Social History:  reports that she quit smoking about 13 months ago. Her smoking use included Cigarettes. She has a .8 pack-year smoking history. She has never used smokeless tobacco. She reports that she drinks alcohol. She reports that she does not use illicit drugs.  Allergies:  Allergies  Allergen Reactions  . Morphine And Related Hives    Medications:                                                                                                                            No current facility-administered medications for this encounter.   Current Outpatient Prescriptions  Medication Sig Dispense Refill  . ibuprofen (  ADVIL,MOTRIN) 200 MG tablet Take 800 mg by mouth 2 (two) times daily as needed (pain).    Marland Kitchen acetaminophen (TYLENOL) 325 MG tablet Take 650 mg by mouth every 6 (six) hours as needed for headache.    Marland Kitchen amLODipine (NORVASC) 10 MG tablet TAKE 1 TABLET BY MOUTH DAILY FOR HYPERTENSION (Patient not taking: Reported on 09/08/2014) 30 tablet 0  . aspirin EC 81 MG EC tablet Take 1 tablet (81 mg total) by mouth daily. (Patient not taking: Reported on 09/08/2014) 30 tablet 0  . glipiZIDE (GLUCOTROL) 10 MG tablet Take 1 tablet (10 mg total) by mouth 2 (two) times daily before a meal. (Patient not taking: Reported on 09/08/2014) 60 tablet 5  . lisinopril-hydrochlorothiazide (ZESTORETIC) 20-12.5 MG per tablet Take 1 tablet by mouth daily. (Patient not taking: Reported on 09/08/2014) 30 tablet 0  . lovastatin (MEVACOR) 40 MG tablet Take 1 tablet (40 mg total) by mouth at bedtime. (Patient not taking: Reported on 09/08/2014) 30 tablet 0  . metFORMIN (GLUCOPHAGE) 1000 MG tablet Take 1 tablet (1,000 mg total) by mouth 2 (two) times daily with a meal. (Patient not taking: Reported on 09/08/2014) 60 tablet 0     ROS:                                                                                                                                       History obtained from the patient  General ROS: negative for - chills, fatigue, fever, night sweats, weight gain or weight loss Psychological ROS: negative for - behavioral disorder, hallucinations, memory difficulties, mood swings or suicidal ideation Ophthalmic ROS: negative for - blurry vision, double vision, eye pain or loss of vision ENT ROS: negative for - epistaxis, nasal discharge, oral lesions, sore throat, tinnitus or vertigo Allergy and Immunology ROS: negative for - hives or itchy/watery eyes Hematological  and Lymphatic ROS: negative for - bleeding problems, bruising or swollen lymph nodes Endocrine ROS: negative for - galactorrhea, hair pattern changes, polydipsia/polyuria or temperature intolerance Respiratory ROS: negative for - cough, hemoptysis, shortness of breath or wheezing Cardiovascular ROS: negative for - chest pain, dyspnea on exertion, edema or irregular heartbeat Gastrointestinal ROS: negative for - abdominal pain, diarrhea, hematemesis, nausea/vomiting or stool incontinence Genito-Urinary ROS: negative for - dysuria, hematuria, incontinence or urinary frequency/urgency Musculoskeletal ROS: negative for - joint swelling or muscular weakness Neurological ROS: as noted in HPI Dermatological ROS: negative for rash and skin lesion changes  Neurologic Examination:  Blood pressure 159/94, pulse 78, temperature 98.1 F (36.7 C), temperature source Oral, resp. rate 22, last menstrual period 08/28/2014, SpO2 99 %.  HEENT-  Normocephalic, no lesions, without obvious abnormality.  Normal external eye and conjunctiva.  Normal TM's bilaterally.  Normal auditory canals and external ears. Normal external nose, mucus membranes and septum.  Normal pharynx. Cardiovascular- S1, S2 normal, pulses palpable throughout   Lungs- chest clear, no wheezing, rales, normal symmetric air entry Abdomen- normal findings: bowel sounds normal Extremities- no edema Lymph-no adenopathy palpable Musculoskeletal-no joint tenderness, deformity or swelling, no muscular tenderness noted Skin-warm and dry, no hyperpigmentation, vitiligo, or suspicious lesions  Neurological Examination Mental Status: Alert, oriented, thought content appropriate.  Speech dysarthric without evidence of aphasia.  Able to follow 3 step commands without difficulty. Cranial Nerves: II: Discs flat bilaterally; Visual fields grossly normal,  pupils equal, round, reactive to light and accommodation III,IV, VI: ptosis not present, extra-ocular motions intact bilaterally V,VII: smile asymmetric on the left, facial light touch sensation decreased on lower face VIII: hearing normal bilaterally IX,X: gag reflex present XI: bilateral shoulder shrug XII: midline tongue extension Motor: Right : Upper extremity   5/5    Left:     Upper extremity   4/5  Lower extremity   5/5     Lower extremity   4/5 Tone and bulk:normal tone throughout; no atrophy noted Sensory: Pinprick and light touch decreased on the left arm and leg Deep Tendon Reflexes: 2+ and symmetric throughout Plantars: Right: downgoing   Left: downgoing Cerebellar: normal finger-to-nose,  and normal heel-to-shin test Gait: not tested due to multiple leads.        Lab Results: Basic Metabolic Panel:  Recent Labs Lab 09/08/14 1011  NA 136  K 3.5  CL 102  CO2 26  GLUCOSE 225*  BUN 7  CREATININE 0.90  CALCIUM 8.9    Liver Function Tests:  Recent Labs Lab 09/08/14 1011  AST 16  ALT 11  ALKPHOS 70  BILITOT 0.5  PROT 7.1  ALBUMIN 3.1*   No results for input(s): LIPASE, AMYLASE in the last 168 hours. No results for input(s): AMMONIA in the last 168 hours.  CBC:  Recent Labs Lab 09/08/14 1011  WBC 10.4  NEUTROABS 5.5  HGB 12.4  HCT 38.8  MCV 73.2*  PLT 361    Cardiac Enzymes: No results for input(s): CKTOTAL, CKMB, CKMBINDEX, TROPONINI in the last 168 hours.  Lipid Panel: No results for input(s): CHOL, TRIG, HDL, CHOLHDL, VLDL, LDLCALC in the last 168 hours.  CBG: No results for input(s): GLUCAP in the last 168 hours.  Microbiology: No results found for this or any previous visit.  Coagulation Studies: No results for input(s): LABPROT, INR in the last 72 hours.  Imaging: Ct Head Wo Contrast  09/08/2014   CLINICAL DATA:  Left-sided weakness.  Facial droop.  EXAM: CT HEAD WITHOUT CONTRAST  TECHNIQUE: Contiguous axial images were  obtained from the base of the skull through the vertex without intravenous contrast.  COMPARISON:  12/23/2013  FINDINGS: Mild scattered microvascular changes in the deep white matter, best seen on the right. No acute intracranial abnormality. Specifically, no hemorrhage, hydrocephalus, mass lesion, acute infarction, or significant intracranial injury. No acute calvarial abnormality. Visualized paranasal sinuses and mastoids clear. Orbital soft tissues unremarkable.  IMPRESSION: No acute intracranial abnormality. Mild chronic microvascular disease. This is advanced for patient's age.   Electronically Signed   By: Rolm Baptise M.D.   On: 09/08/2014 10:49  2 D echo 08-29-2013 Study Conclusions  - Left ventricle: The cavity size was normal. There was moderate to severe concentric hypertrophy. Systolic function was normal. The estimated ejection fraction was in the range of 60% to 65%. Wall motion was normal; there were no regional wall motion abnormalities. Findings consistent with left ventricular diastolic dysfunction. Doppler parameters are consistent with high ventricular filling pressure. - Aortic valve: Mildly thickened leaflets. Probably trileaflet. There was mild regurgitation. Regurgitation pressure half-time: 294 ms. - Aorta: Mild ascending aortic dilatation, diameter 3.63 cm. - Mitral valve: There was trivial regurgitation. - Left atrium: The atrium was moderately dilated. Volume/bsa, S: 37.5 ml/m^2. - Right atrium: The atrium was mildly dilated.  A1c--10.3 on 08-28-2014 LDL--125 on 08-28-2014  Assessment and plan discussed with with attending physician and they are in agreement.    Etta Quill PA-C [facility] [phone]  09/08/2014, 12:08 PM   Assessment: 44 y.o. female presenting with acute onset of left face, arm and leg weakness and decreased sensation. Initial BP was 197/119 and patient has not been on her HTN, cholesterol or ASA medication for  >2weeks. Likely right sided stroke. Given her risk factors and age patient would benefit from  stroke work up.   Stroke Risk Factors - diabetes mellitus, hyperlipidemia and hypertension   1. MRI, MRA  of the brain without contrast 2. PT consult, OT consult, Speech consult 3. Carotid dopplers 4. Prophylactic therapy-Antiplatelet med: Aspirin - dose asa 325 mg daily 5. Risk factor modification 6. Telemetry monitoring 7. Frequent neuro checks 8 NPO until passes stroke swallow screen 9 Hypercoagulable panel 10 stroke team to follow   I personally participated in this patient's evaluation and management, including formula being the above clinical impression and management recommendations.  Rush Farmer M.D. [facility] [phone]

## 2014-09-09 DIAGNOSIS — R0600 Dyspnea, unspecified: Secondary | ICD-10-CM | POA: Insufficient documentation

## 2014-09-09 LAB — GLUCOSE, CAPILLARY
Glucose-Capillary: 211 mg/dL — ABNORMAL HIGH (ref 70–99)
Glucose-Capillary: 191 mg/dL — ABNORMAL HIGH (ref 70–99)
Glucose-Capillary: 214 mg/dL — ABNORMAL HIGH (ref 70–99)
Glucose-Capillary: 227 mg/dL — ABNORMAL HIGH (ref 70–99)

## 2014-09-09 LAB — BASIC METABOLIC PANEL
ANION GAP: 7 (ref 5–15)
BUN: 10 mg/dL (ref 6–23)
CALCIUM: 8.6 mg/dL (ref 8.4–10.5)
CO2: 26 mmol/L (ref 19–32)
Chloride: 101 mEq/L (ref 96–112)
Creatinine, Ser: 0.99 mg/dL (ref 0.50–1.10)
GFR calc non Af Amer: 69 mL/min — ABNORMAL LOW (ref >90)
GFR, EST AFRICAN AMERICAN: 80 mL/min — AB (ref >90)
GLUCOSE: 194 mg/dL — AB (ref 70–99)
Potassium: 3.1 mmol/L — ABNORMAL LOW (ref 3.5–5.1)
SODIUM: 134 mmol/L — AB (ref 135–145)

## 2014-09-09 LAB — CBC
HEMATOCRIT: 35.7 % — AB (ref 36.0–46.0)
Hemoglobin: 11.5 g/dL — ABNORMAL LOW (ref 12.0–15.0)
MCH: 22.9 pg — ABNORMAL LOW (ref 26.0–34.0)
MCHC: 32.2 g/dL (ref 30.0–36.0)
MCV: 71.1 fL — AB (ref 78.0–100.0)
PLATELETS: 340 10*3/uL (ref 150–400)
RBC: 5.02 MIL/uL (ref 3.87–5.11)
RDW: 15.4 % (ref 11.5–15.5)
WBC: 10.6 10*3/uL — ABNORMAL HIGH (ref 4.0–10.5)

## 2014-09-09 LAB — LUPUS ANTICOAGULANT PANEL
DRVVT: 41.6 secs (ref <42.9)
Lupus Anticoagulant: NOT DETECTED
PTT Lupus Anticoagulant: 34.9 secs (ref 28.0–43.0)

## 2014-09-09 LAB — TROPONIN I: Troponin I: <0.03 ng/mL (ref <0.031)

## 2014-09-09 MED ORDER — HYDROCHLOROTHIAZIDE 12.5 MG PO CAPS
12.5000 mg | ORAL_CAPSULE | Freq: Every day | ORAL | Status: DC
  Administered 2014-09-09 – 2014-09-10 (×2): 12.5 mg via ORAL
  Filled 2014-09-09: qty 1

## 2014-09-09 MED ORDER — POTASSIUM CHLORIDE CRYS ER 20 MEQ PO TBCR
40.0000 meq | EXTENDED_RELEASE_TABLET | Freq: Once | ORAL | Status: AC
  Administered 2014-09-09: 40 meq via ORAL
  Filled 2014-09-09: qty 2

## 2014-09-09 MED ORDER — ACETAMINOPHEN 325 MG PO TABS
650.0000 mg | ORAL_TABLET | Freq: Once | ORAL | Status: AC
  Administered 2014-09-09: 650 mg via ORAL
  Filled 2014-09-09: qty 2

## 2014-09-09 MED ORDER — POTASSIUM CHLORIDE CRYS ER 20 MEQ PO TBCR: 40.0000 meq | EXTENDED_RELEASE_TABLET | Freq: Two times a day (BID) | ORAL | Status: DC

## 2014-09-09 NOTE — Clinical Social Work Note (Signed)
CSW Consult Acknowledged:   CSW received a consult for to assist with medications. CSW will inform the case manager. At this time there are no additional needs identified. CSW will sign off.     [place], MSW, [place]

## 2014-09-09 NOTE — Progress Notes (Signed)
Pt c/o of SOB with difficulty swallowing, Bp read 190/94, pt put on oxygen at [facility] sating at 99%, Dr Ardelia Mems (on call) paged and notified, came up to see pt, orders written same commenced, pt reassured, will however continue to monitor. [place], Daymond Cordts Efe

## 2014-09-09 NOTE — Progress Notes (Signed)
Inpatient Diabetes Program Recommendations  AACE/ADA: New Consensus Statement on Inpatient Glycemic Control (2013)  Target Ranges:  Prepandial:   less than 140 mg/dL      Peak postprandial:   less than 180 mg/dL (1-2 hours)      Critically ill patients:  140 - 180 mg/dL     Results for LAYNI, KREAMER (MRN [id number]) as of 09/09/2014 12:46  Ref. Range 09/09/2014 06:56 09/09/2014 11:41  Glucose-Capillary Latest Range: 70-99 mg/dL 211 (H) 191 (H)    Results for AKEEMA, BRODER (MRN [id number]) as of 09/09/2014 12:46  Ref. Range 08/28/2014 17:09  Hgb A1c MFr Bld Latest Range: <5.7 % 10.3 (H)     1/12 - admit for L sided weakness and slurred speech  Assessment: 44 y/o female with PMH T2DM, HTN, HLD, substance abuse, and medication noncompliance due to financial hardship.   Recently admitted for accelerated HTN and ACS rule out. She has not been able to pick up any of her prescribed medications.   Presents with 1 day history of left sided numbness, facial droop, and slurred speech   **A1c during last admission was 10.3%.  Patient apparently could not afford her medications and has not been taking any medications.  **Patient could get her two oral DM medications (Glipizide and Metformin) from [facility] for $4 per Rx.  Not sure what pharmacy she is established at.   MD- If patient continues to have elevated fasting glucose levels, may want to start basal insulin.  Recommend for hospital control: Lantus 10 units QHS (0.1 units/kg)    Will follow Wyn Quaker RN, MSN, CDE Diabetes Coordinator Inpatient Diabetes Program Team Pager: [phone] (8a-10p)

## 2014-09-09 NOTE — Evaluation (Addendum)
Occupational Therapy Evaluation Patient Details Name: Diamond Rice MRN: [id number] DOB: [birth year] Today's Date: 09/09/2014    History of Present Illness 44 y/o female with PMH T2DM, HTN, HLD, substance abuse, and medication noncompliance due to financial hardship. Recently admitted for accelerated HTN and ACS rule out. She has not been able to pick up any of her prescribed medications. Presents with 1 day history of left sided numbness, facial droop, and slurred speech. JYN:WGNFA, acute infarct in the posterior limb of the right internal capsule and corona radiata and moderate chronic small vessel ischemic disease, advanced for age as well as chronic lacunar infarcts in the deep gray nuclei.   Clinical Impression   Pt admitted with above. Pt independent with ADLs, PTA. Feel pt will benefit from acute OT to increase independence with BADLs and address LUE deficits prior to d/c. Feel pt is great CIR candidate.    Follow Up Recommendations  CIR    Equipment Recommendations  Other (comment) (defer to next venue)    Recommendations for Other Services Rehab consult     Precautions / Restrictions Precautions Precautions: Fall Restrictions Weight Bearing Restrictions: No      Mobility Bed Mobility Overal bed mobility: Modified Independent              Transfers Overall transfer level: Needs assistance Equipment used: Rolling walker (2 wheeled);Straight cane Transfers: Sit to/from Stand Sit to Stand: Min guard                 ADL Overall ADL's : Needs assistance/impaired Eating/Feeding: Set up;Supervision/ safety;Sitting   Grooming: Wash/dry face;Wash/dry hands;Oral care;Applying deodorant;Min guard;Standing   Upper Body Bathing: Min guard;Standing Upper Body Bathing Details (indicate cue type and reason): cues for technique for RUE Lower Body Bathing: Sit to/from stand;Moderate assistance   Upper Body Dressing : Moderate assistance;Sitting   Lower Body  Dressing: Moderate assistance;Sit to/from stand   Toilet Transfer: Min guard;Ambulation;BSC Toilet Transfer Details (indicate cue type and reason): holding onto things in room when ambulating Toileting- Clothing Manipulation and Hygiene: Moderate assistance;Sit to/from stand Toileting - Clothing Manipulation Details (indicate cue type and reason): assist with gown and LOB when standing at end.     Functional mobility during ADLs: Min guard General ADL Comments: Encouraged pt to be using left hand for activities. Educated on dressing technique. Pt ambulated to bathroom and peformed ADLs. Assist with donning socks sitting on bed. Talked briefly about CIR.     Vision  Pt reports no change from baseline  Visual fields: Inconsistent in superior quadrants                    Perception     Praxis      Pertinent Vitals/Pain Pain Assessment: No/denies pain     Hand Dominance Right   Extremity/Trunk Assessment Upper Extremity Assessment Upper Extremity Assessment: LUE deficits/detail LUE Deficits / Details: weak grip strength; 2+/5 shoulder flexion LUE Sensation: decreased light touch LUE Coordination: decreased fine motor;decreased gross motor   Lower Extremity Assessment Lower Extremity Assessment: Defer to PT evaluation LLE Deficits / Details: hip flex 2-/5, knee flex/ ext 3+/5, ankle 3/5 LLE Coordination: decreased fine motor;decreased gross motor   Cervical / Trunk Assessment Cervical / Trunk Assessment: Normal   Communication Communication Communication: Expressive difficulties   Cognition Arousal/Alertness: Lethargic Behavior During Therapy: WFL for tasks assessed/performed Overall Cognitive Status: No family/caregiver present to determine baseline cognitive functioning/ WFL but appeared to have some slow processing Area of Impairment: Problem solving  Problem Solving: Slow processing   General Comments          Shoulder Instructions       Home Living Family/patient expects to be discharged to:: Private residence Living Arrangements: Non-relatives/Friends Available Help at Discharge: Friend(s) Type of Home: House                       Home Equipment: None   Additional Comments: pt was recently homeless, has recently begun living with some friends, unsure how much support she has or if returning to this home is a good option for her. Will need CSW consult.  Lives With: Friend(s) (pt says she has been staying with friends)    Prior Functioning/Environment Level of Independence: Independent        Comments: pt works part time as a Quarry manager at a SN[employer]    OT Diagnosis: Other (comment) (hemiparesis non-dominant side)   OT Problem List: Decreased strength;Decreased activity tolerance;Impaired balance (sitting and/or standing);Impaired vision/perception;Decreased coordination;Decreased knowledge of use of DME or AE;Decreased knowledge of precautions;Impaired UE functional use;Impaired sensation   OT Treatment/Interventions: Self-care/ADL training;Therapeutic exercise;Neuromuscular education;DME and/or AE instruction;Therapeutic activities;Patient/family education;Balance training;Cognitive remediation/compensation;Visual/perceptual remediation/compensation    OT Goals(Current goals can be found in the care plan section) Acute Rehab OT Goals Patient Stated Goal: not stated OT Goal Formulation: With patient Time For Goal Achievement: 09/16/14 Potential to Achieve Goals: Good ADL Goals Pt Will Perform Grooming: with set-up;standing Pt Will Perform Lower Body Bathing: with min assist;sit to/from stand Pt Will Perform Upper Body Dressing: with set-up;with supervision;sitting Pt Will Perform Lower Body Dressing: with min assist;sit to/from stand Pt Will Transfer to Toilet: with supervision;ambulating;bedside commode Pt Will Perform Toileting - Clothing Manipulation and hygiene: with min guard assist;sit to/from  stand Additional ADL Goal #1: Pt will indepedently perform HEP for LUE to increase strength/coordination.  OT Frequency: Min 2X/week   Barriers to D/C:            Co-evaluation              End of Session Equipment Utilized During Treatment: Gait belt Nurse Communication: Mobility status;Other (comment) (eating-intermittent supervision)  Activity Tolerance: Patient limited by fatigue Patient left: in chair;with call bell/phone within reach;with chair alarm set   Time: 1713-1736 OT Time Calculation (min): 23 min Charges:  OT General Charges $OT Visit: 1 Procedure OT Evaluation $Initial OT Evaluation Tier I: 1 Procedure OT Treatments $Self Care/Home Management : 8-22 mins G-CodesRoseanne Reno LOTR/L [phone] 09/09/2014, 6:04 PM

## 2014-09-09 NOTE — Progress Notes (Signed)
STROKE TEAM PROGRESS NOTE   HISTORY Diamond Rice is an 44 y.o. female presenting to [place] after waking up with left facial droop and left hand grip weakness. She was LKW 09/07/2014 at 2130. On arrival to ED BP 197/119. Patient went to sleep last night around 21:30. At that time she felt her left arm may have felt funny but not entirely sure. She stats she has been having financial difficulty and has not taken any of her medication over the past weeks including BP, ASA and cholesterol medication. She admits to smoking and drinking occasionally. Upon waking this AM she noted significant weakness and decreased sensation in her left arm and leg along with dysarthria. Patient was not administered TPA secondary to delay in arrival. She was admitted for further evaluation and treatment.   SUBJECTIVE (INTERVAL HISTORY) No family is at the bedside.  Overall she feels her condition is gradually improving.    OBJECTIVE Temp:  [98.1 F (36.7 C)-99 F (37.2 C)] 98.9 F (37.2 C) (01/13 0924) Pulse Rate:  [76-96] 91 (01/13 0924) Cardiac Rhythm:  [-] Normal sinus rhythm (01/13 0200) Resp:  [11-29] 20 (01/13 0924) BP: (143-208)/(88-120) 166/93 mmHg (01/13 0924) SpO2:  [97 %-100 %] 98 % (01/13 0924) Weight:  [102.785 kg (226 lb 9.6 oz)] 102.785 kg (226 lb 9.6 oz) (01/12 1340)   Recent Labs Lab 09/08/14 1810 09/08/14 2200 09/09/14 0656  GLUCAP 292* 262* 211*    Recent Labs Lab 09/08/14 1011 09/09/14 0341  NA 136 134*  K 3.5 3.1*  CL 102 101  CO2 26 26  GLUCOSE 225* 194*  BUN 7 10  CREATININE 0.90 0.99  CALCIUM 8.9 8.6    Recent Labs Lab 09/08/14 1011  AST 16  ALT 11  ALKPHOS 70  BILITOT 0.5  PROT 7.1  ALBUMIN 3.1*    Recent Labs Lab 09/08/14 1011 09/09/14 0341  WBC 10.4 10.6*  NEUTROABS 5.5  --   HGB 12.4 11.5*  HCT 38.8 35.7*  MCV 73.2* 71.1*  PLT 361 340    Recent Labs Lab 09/08/14 2302 09/09/14 0341  TROPONINI <0.03 <0.03   No results for input(s):  LABPROT, INR in the last 72 hours. No results for input(s): COLORURINE, LABSPEC, [place], GLUCOSEU, HGBUR, BILIRUBINUR, KETONESUR, PROTEINUR, UROBILINOGEN, NITRITE, LEUKOCYTESUR in the last 72 hours.  Invalid input(s): APPERANCEUR     Component Value Date/Time   CHOL 196 08/28/2014 1605   TRIG 129 08/28/2014 1605   HDL 45 08/28/2014 1605   CHOLHDL 4.4 08/28/2014 1605   VLDL 26 08/28/2014 1605   LDLCALC 125* 08/28/2014 1605   Lab Results  Component Value Date   HGBA1C 10.3* 08/28/2014      Component Value Date/Time   LABOPIA NONE DETECTED 08/28/2014 1143   COCAINSCRNUR NONE DETECTED 08/28/2014 1143   LABBENZ NONE DETECTED 08/28/2014 1143   AMPHETMU NONE DETECTED 08/28/2014 1143   THCU POSITIVE* 08/28/2014 1143   LABBARB NONE DETECTED 08/28/2014 1143    No results for input(s): ETH in the last 168 hours.  Dg Chest 2 View  09/08/2014   CLINICAL DATA:  Acute onset of shortness of breath and dyspnea. Initial encounter.  EXAM: CHEST  2 VIEW  COMPARISON:  Chest radiograph performed 08/28/2014  FINDINGS: The lungs are well-aerated. Mild left midlung atelectasis or scarring is noted. Mild vascular congestion is seen. There is no evidence of focal opacification, pleural effusion or pneumothorax.  The heart is borderline enlarged. No acute osseous abnormalities are seen.  IMPRESSION: Mild  vascular congestion and borderline cardiomegaly. Mild left mid lung atelectasis noted.   Electronically Signed   By: Garald Balding M.D.   On: 09/08/2014 22:49   Ct Head Wo Contrast  09/08/2014   CLINICAL DATA:  Left-sided weakness.  Facial droop.  EXAM: CT HEAD WITHOUT CONTRAST  TECHNIQUE: Contiguous axial images were obtained from the base of the skull through the vertex without intravenous contrast.  COMPARISON:  12/23/2013  FINDINGS: Mild scattered microvascular changes in the deep white matter, best seen on the right. No acute intracranial abnormality. Specifically, no hemorrhage, hydrocephalus, mass  lesion, acute infarction, or significant intracranial injury. No acute calvarial abnormality. Visualized paranasal sinuses and mastoids clear. Orbital soft tissues unremarkable.  IMPRESSION: No acute intracranial abnormality. Mild chronic microvascular disease. This is advanced for patient's age.   Electronically Signed   By: Rolm Baptise M.D.   On: 09/08/2014 10:49   Mr Brain Wo Contrast  09/08/2014   CLINICAL DATA:  Left-sided numbness beginning yesterday. Left facial droop, left grip weakness, and dysarthria today.  EXAM: MRI HEAD WITHOUT CONTRAST  MRA HEAD WITHOUT CONTRAST  TECHNIQUE: Multiplanar, multiecho pulse sequences of the brain and surrounding structures were obtained without intravenous contrast. Angiographic images of the head were obtained using MRA technique without contrast.  COMPARISON:  Head CT 09/08/2014  FINDINGS: MRI HEAD FINDINGS  There is a small, acute infarct measuring approximately 1.5 cm in the right corona radiata and posterior limb of the right internal capsule. Small focus of susceptibility artifact at the posterior aspect of the right putamen likely represents a small amount of chronic microhemorrhage. Chronic lacunar infarcts are present in the basal ganglia bilaterally and left thalamus. Patchy and confluent T2 hyperintensities throughout subcortical and deep cerebral white matter are nonspecific but compatible with moderate chronic small vessel ischemic disease, advanced for age. There is no evidence of mass, midline shift, or extra-axial fluid collection. Ventricles and sulci are within normal limits for age.  Orbits are unremarkable. Paranasal sinuses and mastoid air cells are clear. Major intracranial vascular flow voids are preserved.  MRA HEAD FINDINGS  Images are moderately degraded by motion artifact.  The visualized distal vertebral arteries are patent but diffusely small in caliber, likely on a congenital basis. The right vertebral artery supplies the basilar and is  irregular distally, which may be artifactual due to motion or reflect atherosclerosis. The intracranial left vertebral artery is not well seen, likely due to its small size and motion artifact, and it may end in PICA or be occluded intracranially. The right PICA origin is patent. SCA origins are patent. Basilar artery is patent although small in caliber diffusely and irregular. There may be a mild focal stenosis in the mid basilar artery, although evaluation is limited by its small size and motion artifact.  There are fetal type origins of both PCAs. PCAs are patent and diffusely irregular. No high-grade proximal PCA stenosis is seen, although there may be mild proximal P2 stenoses bilaterally. There is suggestion of a severe distal left P2 stenosis, although the degree of narrowing may be exaggerated by artifact.  The visualized distal cervical ICA on the right is mildly tortuous. Intracranial ICAs are patent from skullbase to carotid termini. There is mild carotid siphon irregularity without evidence of significant stenosis. M1 segments are patent without significant stenosis. There is mild to moderate MCA branch vessel irregularity bilaterally. ACAs are patent but moderately irregular diffusely with a likely mild mid left A1 stenosis. No intracranial aneurysm is identified.  IMPRESSION:  1. Small, acute infarct in the posterior limb of the right internal capsule and corona radiata. 2. Moderate chronic small vessel ischemic disease, advanced for age. Chronic lacunar infarcts in the deep gray nuclei. 3. Moderately motion degraded head MRA without evidence of major vessel occlusion or high-grade proximal stenosis in the anterior circulation. Diffuse vessel irregularity likely reflects a combination atherosclerosis and motion artifact. 4. Diffusely small caliber of the distal vertebral arteries and basilar artery on a congenital basis. Intracranial left vertebral artery is not well seen and may be occluded or end in  PICA.   Electronically Signed   By: Logan Bores   On: 09/08/2014 17:56   Mr Jodene Nam Head/brain Wo Cm  09/08/2014   CLINICAL DATA:  Left-sided numbness beginning yesterday. Left facial droop, left grip weakness, and dysarthria today.  EXAM: MRI HEAD WITHOUT CONTRAST  MRA HEAD WITHOUT CONTRAST  TECHNIQUE: Multiplanar, multiecho pulse sequences of the brain and surrounding structures were obtained without intravenous contrast. Angiographic images of the head were obtained using MRA technique without contrast.  COMPARISON:  Head CT 09/08/2014  FINDINGS: MRI HEAD FINDINGS  There is a small, acute infarct measuring approximately 1.5 cm in the right corona radiata and posterior limb of the right internal capsule. Small focus of susceptibility artifact at the posterior aspect of the right putamen likely represents a small amount of chronic microhemorrhage. Chronic lacunar infarcts are present in the basal ganglia bilaterally and left thalamus. Patchy and confluent T2 hyperintensities throughout subcortical and deep cerebral white matter are nonspecific but compatible with moderate chronic small vessel ischemic disease, advanced for age. There is no evidence of mass, midline shift, or extra-axial fluid collection. Ventricles and sulci are within normal limits for age.  Orbits are unremarkable. Paranasal sinuses and mastoid air cells are clear. Major intracranial vascular flow voids are preserved.  MRA HEAD FINDINGS  Images are moderately degraded by motion artifact.  The visualized distal vertebral arteries are patent but diffusely small in caliber, likely on a congenital basis. The right vertebral artery supplies the basilar and is irregular distally, which may be artifactual due to motion or reflect atherosclerosis. The intracranial left vertebral artery is not well seen, likely due to its small size and motion artifact, and it may end in PICA or be occluded intracranially. The right PICA origin is patent. SCA origins are  patent. Basilar artery is patent although small in caliber diffusely and irregular. There may be a mild focal stenosis in the mid basilar artery, although evaluation is limited by its small size and motion artifact.  There are fetal type origins of both PCAs. PCAs are patent and diffusely irregular. No high-grade proximal PCA stenosis is seen, although there may be mild proximal P2 stenoses bilaterally. There is suggestion of a severe distal left P2 stenosis, although the degree of narrowing may be exaggerated by artifact.  The visualized distal cervical ICA on the right is mildly tortuous. Intracranial ICAs are patent from skullbase to carotid termini. There is mild carotid siphon irregularity without evidence of significant stenosis. M1 segments are patent without significant stenosis. There is mild to moderate MCA branch vessel irregularity bilaterally. ACAs are patent but moderately irregular diffusely with a likely mild mid left A1 stenosis. No intracranial aneurysm is identified.  IMPRESSION: 1. Small, acute infarct in the posterior limb of the right internal capsule and corona radiata. 2. Moderate chronic small vessel ischemic disease, advanced for age. Chronic lacunar infarcts in the deep gray nuclei. 3. Moderately motion degraded head  MRA without evidence of major vessel occlusion or high-grade proximal stenosis in the anterior circulation. Diffuse vessel irregularity likely reflects a combination atherosclerosis and motion artifact. 4. Diffusely small caliber of the distal vertebral arteries and basilar artery on a congenital basis. Intracranial left vertebral artery is not well seen and may be occluded or end in PICA.   Electronically Signed   By: Logan Bores   On: 09/08/2014 17:56   Carotid Doppler  There is 1-39% bilateral ICA stenosis. Vertebral artery flow is antegrade.    2D Echocardiogram  EF 60-65% with no source of embolus.    PHYSICAL EXAM Obese young african [place] lady not in  distress.Awake alert. Afebrile. Head is nontraumatic. Neck is supple without bruit. Hearing is normal. Cardiac exam no murmur or gallop. Lungs are clear to auscultation. Distal pulses are well felt. Neurological Exam : Awake alert oriented x 3 normal speech and language. Mild left lower face asymmetry. Tongue midline. No drift. Mild diminished fine finger movements on left. Orbits right over left upper extremity. Mild left grip weak.. Normal sensation . Normal coordination. ASSESSMENT/PLAN [name] is a 44 y.o. female with history of T2DM, HTN, HLD, substance abuse, and medication noncompliance due to financial hardship presenting with left hemiparesis. She did not receive IV t-PA due to delay in arrival.   Stroke:  Non-dominant right PLIC infarct secondary to small vessel disease source  Resultant  Left hemiparesis, left hemisensory deficit  MRI  R PLIC/CR infarct. small vessel disease   MRA  No large vessel stenosis  Carotid Doppler  No significant stenosis   2D Echo  No significant stenosis   Heparin 5000 units sq tid for VTE prophylaxis  Diet NPO time specified Except for: Sips with Meds she has passed the stroke swallow screen in the ED and is safe for POs   no antithrombotic prior to admission, now on aspirin 325 mg orally every day  Patient counseled to be compliant with her antithrombotic medications  Ongoing aggressive stroke risk factor management  Therapy recommendations:  pending   Disposition:  pending   Hypertension  Elevated  Permissive hypertension (OK if < 220/120) but gradually normalize in 5-7 days  Hyperlipidemia  Home meds:  mevacor 40 mg daily  LDL 125, goal < 70  Resume statin once diet resumed  Diabetes  HgbA1c 10.3 goal < 7.0  Uncontrolled  Other Stroke Risk Factors  Former Cigarette smoker, quit 08/04/2013  THC use, positive this admission  ETOH use  Morbid Obesity, Body mass index is 42.84 kg/(m^2).   Family hx stroke  (brother)  Migraines  Obstructive sleep apnea  Other Active Problems  Noncompliant with meds due to financial hardship. [facility] day # [place] for Pager information 09/09/2014 10:45 AM  I have personally examined this patient, reviewed notes, independently viewed imaging studies, participated in medical decision making and plan of care. I have made any additions or clarifications directly to the above note. Agree with note above. She has a right brain subcortical infarct secondary to small vessel disease with multiple vascular risk factors   which are uncontrolled. She remains at risk for recurrent strokes. Recommend changing aspirin to Plavix and aggressive risk factor modification.  Antony Contras, MD Medical Director [facility] Pager: [phone] 09/09/2014 3:59 PM    To contact Stroke Continuity provider, please refer to [email or web address] After hours, contact General Neurology \

## 2014-09-09 NOTE — Evaluation (Signed)
Speech Language Pathology Evaluation Patient Details Name: Zakeya Junker MRN: [id number] DOB: 1970/11/23 Today's Date: 09/09/2014 Time: 1215-1229 SLP Time Calculation (min) (ACUTE ONLY): 14 min  Problem List:  Patient Active Problem List   Diagnosis Date Noted  . CVA (cerebral vascular accident) 09/08/2014  . Pain in the chest   . Essential hypertension   . Type 2 diabetes mellitus with complication   . Back pain 09/11/2013  . Psychoactive substance-induced organic mood disorder 05/28/2013  . Cocaine abuse with cocaine-induced mood disorder 05/28/2013  . Cannabis dependence with cannabis-induced anxiety disorder 05/28/2013  . Morbid obesity with BMI of 45.0-49.9, adult 04/25/2012  . Chest pain 04/17/2012  . Hypertension 04/17/2012  . DM (diabetes mellitus), type 2, uncontrolled 04/17/2012  . Hyperlipidemia 04/17/2012  . Tobacco use 04/17/2012   Past Medical History:  Past Medical History  Diagnosis Date  . Hypertension   . Type II diabetes mellitus   . Anginal pain   . High cholesterol   . Shortness of breath 04/17/2012    "all the time"  . Headache(784.0) 04/17/2012    "~ qod; lately waking up in am w/one"  . Migraines 04/17/2012  . Chronic lower back pain 04/17/2012    "just got over some; catched when I walked"  . Depression   . Anxiety   . Sleep apnea   . Obesity    Past Surgical History:  Past Surgical History  Procedure Laterality Date  . Cardiac catheterization  ~ 2011   HPI:  Valinda Fedie is a 44 y.o. female presenting to [facility] ED after waking up with left facial droop and left hand grip weakness. On arrival to ED BP 197/119.MRI positive for small, acute infarct in the posterior limb of the right internal capsule and corona radiata.   Assessment / Plan / Recommendation Clinical Impression  Pt has a mild-moderate dysarthria that is characterized by reduced breath support (MPT = 3 seconds), decreased vocal intensity, and imprecise articulation, all of  which decreases intelligibility at the conversational level. Pt also required Mod cueing for mildly complex problem solving, due in part to decreased working memory. Pt will benefit from skilled SLP services to maximize functional communication and independence.    SLP Assessment  Patient needs continued Speech Lanaguage Pathology Services    Follow Up Recommendations  Home health SLP;Other (comment) (intermittent supervision)    Frequency and Duration min 2x/week  2 weeks   Pertinent Vitals/Pain Pain Assessment: No/denies pain   SLP Goals  Patient/Family Stated Goal: doesn't want thickened liquids Potential to Achieve Goals (ACUTE ONLY): Good  SLP Evaluation Prior Functioning  Cognitive/Linguistic Baseline: Within functional limits Type of Home: House  Lives With: Friend(s) (pt says she has been staying with friends) Available Help at Discharge: Friend(s) 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[place])   Cognition  Overall Cognitive Status: Impaired/Different from baseline Arousal/Alertness: Awake/alert Orientation Level: Oriented X4 Attention: Sustained Sustained Attention: Appears intact Memory: Impaired Memory Impairment: Other (comment) (working memory) Awareness: Appears intact Problem Solving: Impaired Problem Solving Impairment: Verbal complex Safety/Judgment: Appears intact    Comprehension  Auditory Comprehension Overall Auditory Comprehension: Appears within functional limits for tasks assessed Visual Recognition/Discrimination Discrimination: Within Function Limits Reading Comprehension Reading Status: Not tested    Expression Expression Primary Mode of Expression: Verbal Verbal Expression Overall Verbal Expression: Appears within functional limits for tasks assessed Written Expression Written Expression: Not tested   Oral / Motor Oral Motor/Sensory Function Overall Oral Motor/Sensory Function: Impaired Labial ROM: Reduced right;Reduced  left Labial Symmetry: Abnormal symmetry left Labial Strength: Reduced Labial Sensation: Within Functional Limits Lingual ROM: Within Functional Limits Lingual Symmetry: Within Functional Limits Lingual Strength: Reduced Lingual Sensation: Within Functional Limits Facial ROM: Reduced left Facial Symmetry: Left droop Facial Strength: Reduced Facial Sensation: Within Functional Limits Mandible: Within Functional Limits Motor Speech Overall Motor Speech: Impaired Respiration: Impaired Level of Impairment: Phrase Phonation: Low vocal intensity Resonance: Within functional limits Articulation: Impaired Level of Impairment: Conversation Intelligibility: Intelligibility reduced Conversation: 75-100% accurate Motor Planning: Witnin functional limits Motor Speech Errors: Not applicable   GO     Germain Osgood, M.A. CCC-SLP [phone]  Germain Osgood 09/09/2014, 1:52 PM

## 2014-09-09 NOTE — Progress Notes (Signed)
Family Medicine Teaching Service Daily Progress Note Intern Pager: [phone]  [name] Medical record number: [id number] Date of birth: 08/06/1971 Age: 44 y.o. Gender: female  Primary Care Provider: Janora Norlander, DO Consultants: neuro Code Status: full  Pt Overview and Major Events to Date:  1/12 - admit for L sided weakness and slurred speech  Assessment and Plan: Diamond Rice is a 44 y.o. female presenting with L-sided weakness and slurred speech for the past 16 hours. PMH is significant for DM, HTN, HLD, substance abuse and recent chest pain admission.  # Acute stroke - L sided weakness, slurred speech. Risk factors include DM, HTN, HLD, strong family history (2 brothers with strokes in their 37s). CT neg in ED. Neuro consulted in ED. - MRI/MRA brain - confirms acute stroke - carotid duplex ordered - echo 08/29/14 - EF 52-84%, LV diastolic dysfunction, moderate LA dilation, no ASD/PFO - A1c and lipids done 08/28/14 - will not repeat - PT/OT/SLP consulted - ASA 325 per neuro - neuro consulting, appreciate recs  # Dyspnea: Episode of acute dyspnea O/N, likely related to anxiety - EKG unchanged, CXR no infiltrate, trops neg x2 - Resolved with ativan - Continue to monitor  # Hypokalemia: Mild. K 3.1 - Replete with 31mEq Kdur  # DM: A1c 10.3 on 08/28/14 - hold orals, patient not taking at home due to cost - moderate SSI - required 11 units yesterday - CBG monitoring - 211-292 O/N  # HTN: Poorly controlled. Allowed permissive HTN O/N - Continue home amlodipine, lisinopril - Resume home HCTZ today - IV labetolol prn  # HLD: lipid panel 08/28/14 showed LDL 125, HDL 45 and total cholesterol 196 - will increase to high intensity statin  - atorva 40 daily  # Social: Patient is uninsured and has been noncompliant with meds due to cost - consult CM for medication assistance  FEN/GI: NPO pending swallow screen Prophylaxis: SQ heparin  Disposition:  pending completion of stroke w/u and possible placement  Subjective:  Weakness somewhat improved, trouble swallowing  Objective: Temp:  [98.1 F (36.7 C)-99 F (37.2 C)] 98.2 F (36.8 C) (01/13 0618) Pulse Rate:  [76-99] 88 (01/13 0618) Resp:  [11-29] 18 (01/13 0618) BP: (143-208)/(88-120) 143/103 mmHg (01/13 0618) SpO2:  [97 %-100 %] 98 % (01/13 0618) Weight:  [226 lb 9.6 oz (102.785 kg)] 226 lb 9.6 oz (102.785 kg) (01/12 1340) Physical Exam: General: obese middle-aged woman, lying in bed, NAD HEENT: MMM, oropharynx clear, PERRL, EOMI, left cheek and mouth drooping Cardiovascular: RRR, II/VI SEM, 2+ dp pulses Respiratory: CTAB, NWOB Abdomen: soft, NTND, normal BS Extremities: WWP, no LE edema Neuro: 4/5 strength LUE, 4/5 LLE, CN 2-12 intact  Laboratory:  Recent Labs Lab 09/08/14 1011 09/09/14 0341  WBC 10.4 10.6*  HGB 12.4 11.5*  HCT 38.8 35.7*  PLT 361 340    Recent Labs Lab 09/08/14 1011 09/09/14 0341  NA 136 134*  K 3.5 3.1*  CL 102 101  CO2 26 26  BUN 7 10  CREATININE 0.90 0.99  CALCIUM 8.9 8.6  PROT 7.1  --   BILITOT 0.5  --   ALKPHOS 70  --   ALT 11  --   AST 16  --   GLUCOSE 225* 194*    trops neg x2  Lipid Panel     Component Value Date/Time   CHOL 196 08/28/2014 1605   TRIG 129 08/28/2014 1605   HDL 45 08/28/2014 1605   CHOLHDL 4.4 08/28/2014  1605   VLDL 26 08/28/2014 1605   LDLCALC 125* 08/28/2014 1605    A1c (08/28/14): 10.3  Imaging/Diagnostic Tests: CT head: No acute intracranial abnormality. Mild chronic microvascular disease. This is advanced for patient's age.  MRI/MRA (1/12): 1. Small, acute infarct in the posterior limb of the right internal capsule and corona radiata. 2. Moderate chronic small vessel ischemic disease, advanced for age. Chronic lacunar infarcts in the deep gray nuclei. 3. Moderately motion degraded head MRA without evidence of major vessel occlusion or high-grade proximal stenosis in the  anterior circulation. Diffuse vessel irregularity likely reflects a combination atherosclerosis and motion artifact. 4. Diffusely small caliber of the distal vertebral arteries and basilar artery on a congenital basis. Intracranial left vertebral artery is not well seen and may be occluded or end in PICA.  CXR (1/12): Mild vascular congestion and borderline cardiomegaly. Mild left mid lung atelectasis noted.  EKG (1/12): NSR, T wave abnormality, unchanged from previous  Lavon Paganini, MD 09/09/2014, 8:38 AM PGY-1, [place] Intern pager: [phone], text pages welcome

## 2014-09-09 NOTE — Progress Notes (Signed)
*  PRELIMINARY RESULTS* Vascular Ultrasound Carotid Duplex (Doppler) has been completed.   Findings suggest 1-39% internal carotid artery stenosis bilaterally. Vertebral arteries are patent with antegrade flow.  09/09/2014 9:37 AM Maudry Mayhew, RVT, RDCS, RDMS

## 2014-09-09 NOTE — Evaluation (Signed)
Physical Therapy Evaluation Patient Details Name: Diamond Rice MRN: [id number] DOB: 15-Jan-1971 Today's Date: 09/09/2014   History of Present Illness  44 y/o female with PMH T2DM, HTN, HLD, substance abuse, and medication noncompliance due to financial hardship. Recently admitted for accelerated HTN and ACS rule out. She has not been able to pick up any of her prescribed medications. Presents with 1 day history of left sided numbness, facial droop, and slurred speech. YSA:YTKZS, acute infarct in the posterior limb of the right internal capsule and corona radiata and moderate chronic small vessel ischemic disease, advanced for age as well as chronic lacunar infarcts in the deep gray nuclei.  Clinical Impression  Pt admitted with above diagnosis. Pt currently with functional limitations due to the deficits listed below (see PT Problem List). Pt currently with balance deficits and requiring min A and AD to ambulate safely. Would benefit from acute PT to increasr their independence and safety with mobility to allow discharge to the venue listed below.       Follow Up Recommendations CIR    Equipment Recommendations  Other (comment) (TBD)    Recommendations for Other Services OT consult;Rehab consult     Precautions / Restrictions Precautions Precautions: Fall Restrictions Weight Bearing Restrictions: No      Mobility  Bed Mobility Overal bed mobility: Modified Independent             General bed mobility comments: pt able to get to EOB safely and independently  Transfers Overall transfer level: Needs assistance Equipment used: Rolling walker (2 wheeled);Straight cane Transfers: Sit to/from Stand Sit to Stand: Min guard         General transfer comment: no LOB with transfers but pt unsteady to left, min-guard for safety  Ambulation/Gait Ambulation/Gait assistance: Min assist Ambulation Distance (Feet): 70 Feet Assistive device: Rolling walker (2 wheeled);Straight  cane Gait Pattern/deviations: Step-through pattern;Decreased stance time - left;Decreased step length - left;Decreased weight shift to left Gait velocity: decreased Gait velocity interpretation: Below normal speed for age/gender General Gait Details: pt began ambulation with RW but unable to hold consistently with left hand. Switched to [facility] right side and pt had an easier time with this with no significant compromise to balance. With no AD, pt unsteady to left.  Stairs            Wheelchair Mobility    Modified Rankin (Stroke Patients Only) Modified Rankin (Stroke Patients Only) Pre-Morbid Rankin Score: No symptoms Modified Rankin: Moderately severe disability     Balance Overall balance assessment: Needs assistance Sitting-balance support: Single extremity supported Sitting balance-Leahy Scale: Good     Standing balance support: No upper extremity supported Standing balance-Leahy Scale: Fair Standing balance comment: unable to accept challenges to left                             Pertinent Vitals/Pain Pain Assessment: No/denies pain  O2 sats 98% on RA, HR 86 bpm    Home Living Family/patient expects to be discharged to:: Private residence Living Arrangements: Non-relatives/Friends Available Help at Discharge: Friend(s) Type of Home: House         Home Equipment: None Additional Comments: pt was recently homeless, has recently begun living with some friends, unsure how much support she has or if returning to this home is a good option for her. Will need CSW consult.    Prior Function Level of Independence: Independent  Comments: pt works part time as a Quarry manager at a [employer]        Extremity/Trunk Assessment   Upper Extremity Assessment: Defer to OT evaluation;LUE deficits/detail       LUE Deficits / Details: insufficient grip strength to grasp and keep hold of RW   Lower Extremity Assessment: LLE deficits/detail    LLE Deficits / Details: hip flex 2-/5, knee flex/ ext 3+/5, ankle 3/5  Cervical / Trunk Assessment: Normal  Communication   Communication: Expressive difficulties  Cognition Arousal/Alertness: Awake/alert Behavior During Therapy: WFL for tasks assessed/performed Overall Cognitive Status: Impaired/Different from baseline Area of Impairment: Problem solving             Problem Solving: Slow processing;Decreased initiation General Comments: pt with overall dampened responses    General Comments      Exercises General Exercises - Lower Extremity Ankle Circles/Pumps: AROM;Both;10 reps;Seated Long Arc Quad: AROM;Left;10 reps;Seated Hip Flexion/Marching: AROM;Left;10 reps;Seated      Assessment/Plan    PT Assessment Patient needs continued PT services  PT Diagnosis Abnormality of gait;Difficulty walking;Altered mental status;Hemiplegia non-dominant side   PT Problem List Decreased strength;Decreased activity tolerance;Decreased balance;Decreased mobility;Decreased coordination;Decreased cognition;Decreased knowledge of use of DME;Decreased knowledge of precautions  PT Treatment Interventions DME instruction;Gait training;Stair training;Functional mobility training;Therapeutic activities;Therapeutic exercise;Balance training;Neuromuscular re-education;Cognitive remediation;Patient/family education   PT Goals (Current goals can be found in the Care Plan section) Acute Rehab PT Goals Patient Stated Goal: get better PT Goal Formulation: With patient Time For Goal Achievement: 09/23/14 Potential to Achieve Goals: Good Additional Goals Additional Goal #1: pt to score >19 on DGI    Frequency Min 4X/week   Barriers to discharge Decreased caregiver support      Co-evaluation               End of Session Equipment Utilized During Treatment: Gait belt Activity Tolerance: Patient tolerated treatment well Patient left: in chair;with chair alarm set;with call bell/phone  within reach Nurse Communication: Mobility status         Time: 1230-1250 PT Time Calculation (min) (ACUTE ONLY): 20 min   Charges:   PT Evaluation $Initial PT Evaluation Tier I: 1 Procedure PT Treatments $Gait Training: 8-22 mins   PT G Codes:       Leighton Roach, PT  Acute Rehab Services  [place], [place] 09/09/2014, 2:20 PM

## 2014-09-09 NOTE — Progress Notes (Signed)
Rehab Admissions Coordinator Note:  Patient was screened by Cleatrice Burke for appropriateness for an Inpatient Acute Rehab Consult per PT recommendation. At this time, we are recommending Inpatient Rehab consult. I will contact attending MD service.  Cleatrice Burke 09/09/2014, 3:43 PM  I can be reached at [phone].

## 2014-09-09 NOTE — Evaluation (Signed)
Clinical/Bedside Swallow Evaluation Patient Details  Name: Diamond Rice MRN: [id number] Date of Birth: 20-Nov-1970  Today's Date: 09/09/2014 Time: 1202-1215 SLP Time Calculation (min) (ACUTE ONLY): 13 min  Past Medical History:  Past Medical History  Diagnosis Date  . Hypertension   . Type II diabetes mellitus   . Anginal pain   . High cholesterol   . Shortness of breath 04/17/2012    "all the time"  . Headache(784.0) 04/17/2012    "~ qod; lately waking up in am w/one"  . Migraines 04/17/2012  . Chronic lower back pain 04/17/2012    "just got over some; catched when I walked"  . Depression   . Anxiety   . Sleep apnea   . Obesity    Past Surgical History:  Past Surgical History  Procedure Laterality Date  . Cardiac catheterization  ~ 2011   HPI:  Diamond Rice is a 44 y.o. female presenting to [facility] ED after waking up with left facial droop and left hand grip weakness. On arrival to ED BP 197/119.MRI positive for small, acute infarct in the posterior limb of the right internal capsule and corona radiata.   Assessment / Plan / Recommendation Clinical Impression  Pt has mild left-sided weakness that results in anterior loss, prolonged oral transit, and pt's subjective c/o biting her inner cheek. Sensation and awareness appear appropriate, as she is able to self-regulate for the above. No overt signs of aspiration were observed throughout challenging. Recommend Dys 3 diet to facilitate oral preparation and thin liquids with brief SLP f/u for tolerance and advancement.    Aspiration Risk  Mild    Diet Recommendation Dysphagia 3 (Mechanical Soft);Thin liquid   Liquid Administration via: Cup;Straw Medication Administration: Whole meds with puree Supervision: Patient able to self feed;Intermittent supervision to cue for compensatory strategies Compensations: Slow rate;Small sips/bites;Check for pocketing Postural Changes and/or Swallow Maneuvers: Seated upright 90  degrees;Upright 30-60 min after meal    Other  Recommendations Oral Care Recommendations: Oral care BID   Follow Up Recommendations  Home health SLP;Other (comment) (intermittent supervision)    Frequency and Duration min 2x/week  2 weeks   Pertinent Vitals/Pain n/a    SLP Swallow Goals     Swallow Study Prior Functional Status       General Date of Onset: 09/08/14 HPI: Diamond Rice is a 44 y.o. female presenting to [facility] ED after waking up with left facial droop and left hand grip weakness. On arrival to ED BP 197/119.MRI positive for small, acute infarct in the posterior limb of the right internal capsule and corona radiata. Type of Study: Bedside swallow evaluation Previous Swallow Assessment: none in chart Diet Prior to this Study: NPO Temperature Spikes Noted: No Respiratory Status: Room air History of Recent Intubation: No Behavior/Cognition: Alert;Cooperative;Pleasant mood Oral Cavity - Dentition: Adequate natural dentition Self-Feeding Abilities: Able to feed self Patient Positioning: Upright in bed Baseline Vocal Quality: Clear Volitional Cough: Strong Volitional Swallow: Able to elicit    Oral/Motor/Sensory Function Overall Oral Motor/Sensory Function: Impaired Labial ROM: Reduced right;Reduced left (? effort) Labial Symmetry: Abnormal symmetry left Labial Strength: Reduced Labial Sensation: Within Functional Limits Lingual ROM: Within Functional Limits Lingual Symmetry: Within Functional Limits Lingual Strength: Reduced Lingual Sensation: Within Functional Limits Facial ROM: Reduced left Facial Symmetry: Left droop Facial Strength: Reduced Facial Sensation: Within Functional Limits Mandible: Within Functional Limits   Ice Chips Ice chips: Not tested   Thin Liquid Thin Liquid: Impaired Presentation: Cup;Self Fed;Straw Oral Phase Impairments:  Reduced labial seal Oral Phase Functional Implications: Left anterior spillage    Nectar Thick Nectar  Thick Liquid: Not tested   Honey Thick Honey Thick Liquid: Not tested   Puree Puree: Within functional limits Presentation: Self Fed;Spoon   Solid   GO    Solid: Impaired Presentation: Self Fed Oral Phase Impairments: Reduced labial seal;Reduced lingual movement/coordination;Impaired anterior to posterior transit Oral Phase Functional Implications: Left anterior spillage      Germain Osgood, M.A. CCC-SLP [phone]  Germain Osgood 09/09/2014,1:46 PM

## 2014-09-09 NOTE — Discharge Summary (Signed)
Diamond Rice Discharge Summary  [name] Medical record number: [id number] Date of birth: Sep 28, 1970 Age: 44 y.o. Gender: female Date of Admission: 09/08/2014  Date of Discharge: 09/11/2014  Admitting Physician: Lupita Dawn, MD  Primary Care Provider: Janora Norlander, DO Consultants: Neuro, PM&R  Indication for Hospitalization: L sided weakness, slurred speech  Discharge Diagnoses/Problem List:  Acute R PLIC infarct 2/2 small vessel disease Dypnea - resolved, anxiety related Hypokalemia - resolved T2DM HTN HLD  Disposition: CIR  Discharge Condition: stable  Discharge Exam:  Filed Vitals:   09/11/14 1119  BP:   Pulse: 76  Temp: 97.9 F (36.6 C)  Resp: 20   General: obese middle-aged woman, lying in bed, NAD HEENT: MMM, oropharynx clear, PERRL, EOMI, left cheek and mouth drooping Cardiovascular: RRR, II/VI SEM, 2+ dp pulses Respiratory: CTAB, NWOB Abdomen: soft, NTND, normal BS Extremities: WWP, no LE edema Neuro: 4/5 strength LUE, 4/5 LLE, CN 2-12 intact  Brief Rice Course:  Diamond Rice is a 44 y.o. female presenting with L-sided weakness and slurred speech x16 hours. PMH is significant for DM, HTN, HLD, substance abuse and recent chest pain admission.  Code stroke was called on presentation to ED.  CT head negative, but MRI/MRA confirmed acute stroke.  Neurology was consulted on admission and followed throughout.  Recent echo with only diastolic dysfunction. Carotid duplex showed b/l 1-39% ICA stenosis. Recent A1c 10.3 and patient noted not taking any home DM meds 2/2 cost.  She was managed with moderate SSI during admission and resumed on home medications at discharge.  Recent lipid panel showed LDL 125, so statin was increased to high intensity atorvastatin 40mg  daily.  Patient has uncontrolled hypertension.  She was allowed permissive hypertension for first 24hrs.  Then home lisinopril, amlodipine, and HCTZ  (increased to 25mg ) were resumed.  BP under better control prior to d/c.  She was started on aspirin 325mg  daily (had not been able to afford previously).  She had a thrombophilic work-up from neurology that revealed elevated anticardiolipin IgM.  Neurology recommended repeating in 12 weeks and no need for anticoagulation at this time.  PT/OT/SLP follwoed throughout admission and recommended CIR.  Patient had episode of acute dyspnea on 1/13, likely related to anxiety. Resolved with ativan. EKG unchanged, CXR no infiltrate, trops neg x2.  All other chronic medical conditions stable throughout admission and managed with home regimens.  Issues for Follow Up:  1. Patient needs ongoing rehab  2. F/u BP control  3. Moderately elevated anticardiolipin IgM - repeat in 12 weeks. Consider anticoagulation if still elevated and concern for antiphospholipid syndrome 4. Neuro follow-up  Significant Procedures: none  Significant Labs and Imaging:   Recent Labs Lab 09/08/14 1011 09/09/14 0341 09/10/14 0341  WBC 10.4 10.6* 11.3*  HGB 12.4 11.5* 11.9*  HCT 38.8 35.7* 37.2  PLT 361 340 377    Recent Labs Lab 09/08/14 1011 09/09/14 0341 09/10/14 0341  NA 136 134* 139  K 3.5 3.1* 3.7  CL 102 101 102  CO2 26 26 26   GLUCOSE 225* 194* 175*  BUN 7 10 9   CREATININE 0.90 0.99 0.96  CALCIUM 8.9 8.6 9.2  ALKPHOS 70  --   --   AST 16  --   --   ALT 11  --   --   ALBUMIN 3.1*  --   --     trops neg x2  Lipid Panel   Labs (Brief)  Component Value Date/Time   CHOL 196 08/28/2014 1605   TRIG 129 08/28/2014 1605   HDL 45 08/28/2014 1605   CHOLHDL 4.4 08/28/2014 1605   VLDL 26 08/28/2014 1605   LDLCALC 125* 08/28/2014 1605      A1c (08/28/14): 10.3  Anticardiolipin Iga 6, IgM 21, IgG 9  PTT Lupus anticoag - 34.9  Lupus anticoagulant not detected  Beta-2 glyco IgG 14  Antithromb III Func 112  Protein C activity 154, total 108  Protein S activity  74, total 130  INR 0.98  Imaging/Diagnostic Tests: CT head: No acute intracranial abnormality. Mild chronic microvascular disease. This is advanced for patient's age.  MRI/MRA (1/12): 1. Small, acute infarct in the posterior limb of the right internal capsule and corona radiata. 2. Moderate chronic small vessel ischemic disease, advanced for age. Chronic lacunar infarcts in the deep gray nuclei. 3. Moderately motion degraded head MRA without evidence of major vessel occlusion or high-grade proximal stenosis in the anterior circulation. Diffuse vessel irregularity likely reflects a combination atherosclerosis and motion artifact. 4. Diffusely small caliber of the distal vertebral arteries and basilar artery on a congenital basis. Intracranial left vertebral artery is not well seen and may be occluded or end in PICA.  CXR (1/12): Mild vascular congestion and borderline cardiomegaly. Mild left mid lung atelectasis noted.  EKG (1/12): NSR, T wave abnormality, unchanged from previous   Results/Tests Pending at Time of Discharge: none  Discharge Medications:    Medication List    STOP taking these medications        aspirin 81 MG EC tablet  Replaced by:  aspirin 325 MG tablet     lisinopril-hydrochlorothiazide 20-12.5 MG per tablet  Commonly known as:  ZESTORETIC     lovastatin 40 MG tablet  Commonly known as:  MEVACOR      TAKE these medications        acetaminophen 325 MG tablet  Commonly known as:  TYLENOL  Take 650 mg by mouth every 6 (six) hours as needed for headache.     amLODipine 10 MG tablet  Commonly known as:  NORVASC  TAKE 1 TABLET BY MOUTH DAILY FOR HYPERTENSION     aspirin 325 MG tablet  Take 1 tablet (325 mg total) by mouth daily.     atorvastatin 40 MG tablet  Commonly known as:  LIPITOR  Take 1 tablet (40 mg total) by mouth daily at 6 PM.     glipiZIDE 10 MG tablet  Commonly known as:  GLUCOTROL  Take 1 tablet (10 mg total) by mouth 2 (two)  times daily before a meal.     hydrochlorothiazide 25 MG tablet  Commonly known as:  HYDRODIURIL  Take 1 tablet (25 mg total) by mouth daily.     ibuprofen 200 MG tablet  Commonly known as:  ADVIL,MOTRIN  Take 800 mg by mouth 2 (two) times daily as needed (pain).     lisinopril 20 MG tablet  Commonly known as:  PRINIVIL,ZESTRIL  Take 1 tablet (20 mg total) by mouth daily.     metFORMIN 1000 MG tablet  Commonly known as:  GLUCOPHAGE  Take 1 tablet (1,000 mg total) by mouth 2 (two) times daily with a meal.     senna-docusate 8.6-50 MG per tablet  Commonly known as:  Senokot-S  Take 1 tablet by mouth at bedtime as needed for mild constipation.        Discharge Instructions: Please refer to Patient Instructions section of EMR for  full details.  Patient was counseled important signs and symptoms that should prompt return to medical care, changes in medications, dietary instructions, activity restrictions, and follow up appointments.   Follow-Up Appointments: Follow-up Information    Follow up with SETHI,PRAMOD, MD In 1 month.   Specialties:  Neurology, Radiology   Why:  Stroke Clinic, Office will call you with appointment date & time   Contact information:   [place] [address] [phone]       Follow up with Janora Norlander, DO.   Specialty:  Family Medicine   Why:  For Rice follow-up after discharge from rehab   Contact information:   [address] [place] [address] [phone]       Lavon Paganini, MD 09/11/2014, 12:24 PM PGY-1, [place]

## 2014-09-10 DIAGNOSIS — G8190 Hemiplegia, unspecified affecting unspecified side: Secondary | ICD-10-CM

## 2014-09-10 LAB — CBC
HCT: 37.2 % (ref 36.0–46.0)
HEMOGLOBIN: 11.9 g/dL — AB (ref 12.0–15.0)
MCH: 23.2 pg — ABNORMAL LOW (ref 26.0–34.0)
MCHC: 32 g/dL (ref 30.0–36.0)
MCV: 72.5 fL — ABNORMAL LOW (ref 78.0–100.0)
PLATELETS: 377 10*3/uL (ref 150–400)
RBC: 5.13 MIL/uL — AB (ref 3.87–5.11)
RDW: 15.7 % — AB (ref 11.5–15.5)
WBC: 11.3 10*3/uL — ABNORMAL HIGH (ref 4.0–10.5)

## 2014-09-10 LAB — PROTEIN S, TOTAL: PROTEIN S AG TOTAL: 130 % (ref 58–150)

## 2014-09-10 LAB — PROTEIN C ACTIVITY: Protein C Activity: 154 % — ABNORMAL HIGH (ref 74–151)

## 2014-09-10 LAB — BASIC METABOLIC PANEL
Anion gap: 11 (ref 5–15)
BUN: 9 mg/dL (ref 6–23)
CO2: 26 mmol/L (ref 19–32)
Calcium: 9.2 mg/dL (ref 8.4–10.5)
Chloride: 102 mEq/L (ref 96–112)
Creatinine, Ser: 0.96 mg/dL (ref 0.50–1.10)
GFR calc Af Amer: 83 mL/min — ABNORMAL LOW (ref >90)
GFR calc non Af Amer: 72 mL/min — ABNORMAL LOW (ref >90)
Glucose, Bld: 175 mg/dL — ABNORMAL HIGH (ref 70–99)
Potassium: 3.7 mmol/L (ref 3.5–5.1)
SODIUM: 139 mmol/L (ref 135–145)

## 2014-09-10 LAB — BETA-2-GLYCOPROTEIN I ABS, IGG/M/A
Beta-2 Glyco I IgG: 14 G Units (ref <20)
Beta-2-Glycoprotein I IgA: 7 A Units (ref <20)
Beta-2-Glycoprotein I IgM: 10 M Units (ref <20)

## 2014-09-10 LAB — GLUCOSE, CAPILLARY
GLUCOSE-CAPILLARY: 155 mg/dL — AB (ref 70–99)
GLUCOSE-CAPILLARY: 274 mg/dL — AB (ref 70–99)
Glucose-Capillary: 255 mg/dL — ABNORMAL HIGH (ref 70–99)
Glucose-Capillary: 230 mg/dL — ABNORMAL HIGH (ref 70–99)

## 2014-09-10 LAB — PROTEIN C, TOTAL: PROTEIN C, TOTAL: 108 % (ref 70–140)

## 2014-09-10 LAB — CARDIOLIPIN ANTIBODIES, IGG, IGM, IGA
Anticardiolipin IgA: 6 APL U/mL — ABNORMAL LOW (ref <22)
Anticardiolipin IgG: 9 GPL U/mL — ABNORMAL LOW (ref <23)
Anticardiolipin IgM: 21 MPL U/mL — ABNORMAL HIGH (ref <11)

## 2014-09-10 LAB — PROTEIN S ACTIVITY: PROTEIN S ACTIVITY: 74 % (ref 60–145)

## 2014-09-10 LAB — HOMOCYSTEINE: HOMOCYSTEINE-NORM: 9.3 umol/L (ref 0.0–15.0)

## 2014-09-10 MED ORDER — HYDROCHLOROTHIAZIDE 12.5 MG PO CAPS
12.5000 mg | ORAL_CAPSULE | Freq: Once | ORAL | Status: AC
  Administered 2014-09-10: 12.5 mg via ORAL
  Filled 2014-09-10: qty 1

## 2014-09-10 MED ORDER — HYDROCHLOROTHIAZIDE 25 MG PO TABS
25.0000 mg | ORAL_TABLET | Freq: Every day | ORAL | Status: DC
  Administered 2014-09-11: 25 mg via ORAL
  Filled 2014-09-10: qty 1

## 2014-09-10 MED ORDER — OXYCODONE-ACETAMINOPHEN 5-325 MG PO TABS
1.0000 | ORAL_TABLET | Freq: Four times a day (QID) | ORAL | Status: DC | PRN
  Administered 2014-09-10 – 2014-09-11 (×2): 1 via ORAL
  Filled 2014-09-10 (×2): qty 1

## 2014-09-10 NOTE — Progress Notes (Signed)
STROKE TEAM PROGRESS NOTE   HISTORY Diamond Rice is an 44 y.o. female presenting to [facility] after waking up with left facial droop and left hand grip weakness. She was LKW 09/07/2014 at 2130. On arrival to ED BP 197/119. Patient went to sleep last night around 21:30. At that time she felt her left arm may have felt funny but not entirely sure. She stats she has been having financial difficulty and has not taken any of her medication over the past weeks including BP, ASA and cholesterol medication. She admits to smoking and drinking occasionally. Upon waking this AM she noted significant weakness and decreased sensation in her left arm and leg along with dysarthria. Patient was not administered TPA secondary to delay in arrival. She was admitted for further evaluation and treatment.   SUBJECTIVE (INTERVAL HISTORY) No family is at the bedside.  Overall she feels her condition is gradually improving. She reports that rehab saw here this am and they are looking at IP rehab.   OBJECTIVE Temp:  [98.1 F (36.7 C)-100 F (37.8 C)] 100 F (37.8 C) (01/14 0947) Pulse Rate:  [86-96] 89 (01/14 0947) Cardiac Rhythm:  [-] Normal sinus rhythm (01/13 2005) Resp:  [19-20] 20 (01/14 0947) BP: (134-178)/(74-95) 160/90 mmHg (01/14 0947) SpO2:  [95 %-100 %] 95 % (01/14 0947)   Recent Labs Lab 09/09/14 0656 09/09/14 1141 09/09/14 1638 09/09/14 2118 09/10/14 0644  GLUCAP 211* 191* 214* 227* 155*    Recent Labs Lab 09/08/14 1011 09/09/14 0341 09/10/14 0341  NA 136 134* 139  K 3.5 3.1* 3.7  CL 102 101 102  CO2 26 26 26   GLUCOSE 225* 194* 175*  BUN 7 10 9   CREATININE 0.90 0.99 0.96  CALCIUM 8.9 8.6 9.2    Recent Labs Lab 09/08/14 1011  AST 16  ALT 11  ALKPHOS 70  BILITOT 0.5  PROT 7.1  ALBUMIN 3.1*    Recent Labs Lab 09/08/14 1011 09/09/14 0341 09/10/14 0341  WBC 10.4 10.6* 11.3*  NEUTROABS 5.5  --   --   HGB 12.4 11.5* 11.9*  HCT 38.8 35.7* 37.2  MCV 73.2* 71.1* 72.5*   PLT 361 340 377    Recent Labs Lab 09/08/14 2302 09/09/14 0341  TROPONINI <0.03 <0.03   No results for input(s): LABPROT, INR in the last 72 hours. No results for input(s): COLORURINE, LABSPEC, [place], GLUCOSEU, HGBUR, BILIRUBINUR, KETONESUR, PROTEINUR, UROBILINOGEN, NITRITE, LEUKOCYTESUR in the last 72 hours.  Invalid input(s): APPERANCEUR     Component Value Date/Time   CHOL 196 08/28/2014 1605   TRIG 129 08/28/2014 1605   HDL 45 08/28/2014 1605   CHOLHDL 4.4 08/28/2014 1605   VLDL 26 08/28/2014 1605   LDLCALC 125* 08/28/2014 1605   Lab Results  Component Value Date   HGBA1C 10.3* 08/28/2014      Component Value Date/Time   LABOPIA NONE DETECTED 08/28/2014 1143   COCAINSCRNUR NONE DETECTED 08/28/2014 1143   LABBENZ NONE DETECTED 08/28/2014 1143   AMPHETMU NONE DETECTED 08/28/2014 1143   THCU POSITIVE* 08/28/2014 1143   LABBARB NONE DETECTED 08/28/2014 1143    No results for input(s): ETH in the last 168 hours.  Dg Chest 2 View  09/08/2014   CLINICAL DATA:  Acute onset of shortness of breath and dyspnea. Initial encounter.  EXAM: CHEST  2 VIEW  COMPARISON:  Chest radiograph performed 08/28/2014  FINDINGS: The lungs are well-aerated. Mild left midlung atelectasis or scarring is noted. Mild vascular congestion is seen. There is  no evidence of focal opacification, pleural effusion or pneumothorax.  The heart is borderline enlarged. No acute osseous abnormalities are seen.  IMPRESSION: Mild vascular congestion and borderline cardiomegaly. Mild left mid lung atelectasis noted.   Electronically Signed   By: Garald Balding M.D.   On: 09/08/2014 22:49   Ct Head Wo Contrast  09/08/2014   CLINICAL DATA:  Left-sided weakness.  Facial droop.  EXAM: CT HEAD WITHOUT CONTRAST  TECHNIQUE: Contiguous axial images were obtained from the base of the skull through the vertex without intravenous contrast.  COMPARISON:  12/23/2013  FINDINGS: Mild scattered microvascular changes in the deep  white matter, best seen on the right. No acute intracranial abnormality. Specifically, no hemorrhage, hydrocephalus, mass lesion, acute infarction, or significant intracranial injury. No acute calvarial abnormality. Visualized paranasal sinuses and mastoids clear. Orbital soft tissues unremarkable.  IMPRESSION: No acute intracranial abnormality. Mild chronic microvascular disease. This is advanced for patient's age.   Electronically Signed   By: Rolm Baptise M.D.   On: 09/08/2014 10:49   Mr Brain Wo Contrast  09/08/2014   CLINICAL DATA:  Left-sided numbness beginning yesterday. Left facial droop, left grip weakness, and dysarthria today.  EXAM: MRI HEAD WITHOUT CONTRAST  MRA HEAD WITHOUT CONTRAST  TECHNIQUE: Multiplanar, multiecho pulse sequences of the brain and surrounding structures were obtained without intravenous contrast. Angiographic images of the head were obtained using MRA technique without contrast.  COMPARISON:  Head CT 09/08/2014  FINDINGS: MRI HEAD FINDINGS  There is a small, acute infarct measuring approximately 1.5 cm in the right corona radiata and posterior limb of the right internal capsule. Small focus of susceptibility artifact at the posterior aspect of the right putamen likely represents a small amount of chronic microhemorrhage. Chronic lacunar infarcts are present in the basal ganglia bilaterally and left thalamus. Patchy and confluent T2 hyperintensities throughout subcortical and deep cerebral white matter are nonspecific but compatible with moderate chronic small vessel ischemic disease, advanced for age. There is no evidence of mass, midline shift, or extra-axial fluid collection. Ventricles and sulci are within normal limits for age.  Orbits are unremarkable. Paranasal sinuses and mastoid air cells are clear. Major intracranial vascular flow voids are preserved.  MRA HEAD FINDINGS  Images are moderately degraded by motion artifact.  The visualized distal vertebral arteries are  patent but diffusely small in caliber, likely on a congenital basis. The right vertebral artery supplies the basilar and is irregular distally, which may be artifactual due to motion or reflect atherosclerosis. The intracranial left vertebral artery is not well seen, likely due to its small size and motion artifact, and it may end in PICA or be occluded intracranially. The right PICA origin is patent. SCA origins are patent. Basilar artery is patent although small in caliber diffusely and irregular. There may be a mild focal stenosis in the mid basilar artery, although evaluation is limited by its small size and motion artifact.  There are fetal type origins of both PCAs. PCAs are patent and diffusely irregular. No high-grade proximal PCA stenosis is seen, although there may be mild proximal P2 stenoses bilaterally. There is suggestion of a severe distal left P2 stenosis, although the degree of narrowing may be exaggerated by artifact.  The visualized distal cervical ICA on the right is mildly tortuous. Intracranial ICAs are patent from skullbase to carotid termini. There is mild carotid siphon irregularity without evidence of significant stenosis. M1 segments are patent without significant stenosis. There is mild to moderate MCA branch vessel  irregularity bilaterally. ACAs are patent but moderately irregular diffusely with a likely mild mid left A1 stenosis. No intracranial aneurysm is identified.  IMPRESSION: 1. Small, acute infarct in the posterior limb of the right internal capsule and corona radiata. 2. Moderate chronic small vessel ischemic disease, advanced for age. Chronic lacunar infarcts in the deep gray nuclei. 3. Moderately motion degraded head MRA without evidence of major vessel occlusion or high-grade proximal stenosis in the anterior circulation. Diffuse vessel irregularity likely reflects a combination atherosclerosis and motion artifact. 4. Diffusely small caliber of the distal vertebral arteries  and basilar artery on a congenital basis. Intracranial left vertebral artery is not well seen and may be occluded or end in PICA.   Electronically Signed   By: Logan Bores   On: 09/08/2014 17:56   Mr Jodene Nam Head/brain Wo Cm  09/08/2014   CLINICAL DATA:  Left-sided numbness beginning yesterday. Left facial droop, left grip weakness, and dysarthria today.  EXAM: MRI HEAD WITHOUT CONTRAST  MRA HEAD WITHOUT CONTRAST  TECHNIQUE: Multiplanar, multiecho pulse sequences of the brain and surrounding structures were obtained without intravenous contrast. Angiographic images of the head were obtained using MRA technique without contrast.  COMPARISON:  Head CT 09/08/2014  FINDINGS: MRI HEAD FINDINGS  There is a small, acute infarct measuring approximately 1.5 cm in the right corona radiata and posterior limb of the right internal capsule. Small focus of susceptibility artifact at the posterior aspect of the right putamen likely represents a small amount of chronic microhemorrhage. Chronic lacunar infarcts are present in the basal ganglia bilaterally and left thalamus. Patchy and confluent T2 hyperintensities throughout subcortical and deep cerebral white matter are nonspecific but compatible with moderate chronic small vessel ischemic disease, advanced for age. There is no evidence of mass, midline shift, or extra-axial fluid collection. Ventricles and sulci are within normal limits for age.  Orbits are unremarkable. Paranasal sinuses and mastoid air cells are clear. Major intracranial vascular flow voids are preserved.  MRA HEAD FINDINGS  Images are moderately degraded by motion artifact.  The visualized distal vertebral arteries are patent but diffusely small in caliber, likely on a congenital basis. The right vertebral artery supplies the basilar and is irregular distally, which may be artifactual due to motion or reflect atherosclerosis. The intracranial left vertebral artery is not well seen, likely due to its small size  and motion artifact, and it may end in PICA or be occluded intracranially. The right PICA origin is patent. SCA origins are patent. Basilar artery is patent although small in caliber diffusely and irregular. There may be a mild focal stenosis in the mid basilar artery, although evaluation is limited by its small size and motion artifact.  There are fetal type origins of both PCAs. PCAs are patent and diffusely irregular. No high-grade proximal PCA stenosis is seen, although there may be mild proximal P2 stenoses bilaterally. There is suggestion of a severe distal left P2 stenosis, although the degree of narrowing may be exaggerated by artifact.  The visualized distal cervical ICA on the right is mildly tortuous. Intracranial ICAs are patent from skullbase to carotid termini. There is mild carotid siphon irregularity without evidence of significant stenosis. M1 segments are patent without significant stenosis. There is mild to moderate MCA branch vessel irregularity bilaterally. ACAs are patent but moderately irregular diffusely with a likely mild mid left A1 stenosis. No intracranial aneurysm is identified.  IMPRESSION: 1. Small, acute infarct in the posterior limb of the right internal capsule and corona  radiata. 2. Moderate chronic small vessel ischemic disease, advanced for age. Chronic lacunar infarcts in the deep gray nuclei. 3. Moderately motion degraded head MRA without evidence of major vessel occlusion or high-grade proximal stenosis in the anterior circulation. Diffuse vessel irregularity likely reflects a combination atherosclerosis and motion artifact. 4. Diffusely small caliber of the distal vertebral arteries and basilar artery on a congenital basis. Intracranial left vertebral artery is not well seen and may be occluded or end in PICA.   Electronically Signed   By: Logan Bores   On: 09/08/2014 17:56   Carotid Doppler  There is 1-39% bilateral ICA stenosis. Vertebral artery flow is antegrade.     2D Echocardiogram  EF 60-65% with no source of embolus.    PHYSICAL EXAM Obese young african [place] lady not in distress.Awake alert. Afebrile. Head is nontraumatic. Neck is supple without bruit. Hearing is normal. Cardiac exam no murmur or gallop. Lungs are clear to auscultation. Distal pulses are well felt. Neurological Exam : Awake alert oriented x 3 normal speech and language. Mild left lower face asymmetry. Tongue midline. No drift. Mild diminished fine finger movements on left. Orbits right over left upper extremity. Mild left grip weak.. Normal sensation . Normal coordination.   ASSESSMENT/PLAN Diamond Rice is a 44 y.o. female with history of T2DM, HTN, HLD, substance abuse, and medication noncompliance due to financial hardship presenting with left hemiparesis. She did not receive IV t-PA due to delay in arrival.   Stroke:  Non-dominant right PLIC infarct secondary to small vessel disease source  Resultant  Left hemiparesis, left hemisensory deficit  MRI  R PLIC/CR infarct. small vessel disease   MRA  No large vessel stenosis  Carotid Doppler  No significant stenosis   2D Echo  No significant stenosis   Heparin 5000 units sq tid for VTE prophylaxis  DIET DYS 3 thin liquids   no antithrombotic prior to admission, now on aspirin 325 mg orally every day  Patient counseled to be compliant with her antithrombotic medications  Ongoing aggressive stroke risk factor management  Therapy recommendations:  CIR. Recommend consult.  Disposition:  CIR  Hypertension  improving  Permissive hypertension (OK if < 220/120) but gradually normalize in 5-7 days  Hyperlipidemia  Home meds:  mevacor 40 mg daily  LDL 125, goal < 70  Now on lipitor 40  Continue statin at discharge  Diabetes  HgbA1c 10.3 goal < 7.0  Uncontrolled  Other Stroke Risk Factors  Former Cigarette smoker, quit 08/04/2013  THC use, positive this admission  ETOH use  Morbid Obesity,  Body mass index is 42.84 kg/(m^2).   Family hx stroke (brother)  Migraines  Obstructive sleep apnea  Other Active Problems  Noncompliant with meds due to financial hardship. SW consulting   NO FURTHER STROKE WORKUP INDICATED  Patient has a 10-15% risk of having another stroke over the next year, the highest risk is within 2 weeks of the most recent stroke/TIA (risk of having a stroke following a stroke or TIA is the same).  Ongoing risk factor control by Primary Care Physician  Stroke Service will sign off. Please call should any needs arise.  Follow up with Dr. Antony Contras, stroke clinic at [facility] in 1 month, order placed.  Hospital day # [place] for Pager information 09/10/2014 10:36 AM  I have personally examined this patient, reviewed notes, independently viewed imaging studies, participated in medical decision making and  plan of care. I have made any additions or clarifications directly to the above note. Agree with note above.   Antony Contras, MD Medical Director [facility] Pager: [phone] 09/10/2014 12:43 PM    To contact Stroke Continuity provider, please refer to [email or web address] After hours, contact General Neurology \

## 2014-09-10 NOTE — Consult Note (Signed)
Physical Medicine and Rehabilitation Consult Reason for Consult: Right PLIC infarct secondary to small vessel disease Referring Physician: [facility]   HPI: Diamond Rice is a 44 y.o. right handed female with history of hypertension, diabetes mellitus and peripheral neuropathy, polysubstance abuse and medical noncompliance due to financial hardship. Patient recently homeless and has been living with friends recently. Presented 09/08/2014 with left-sided weakness and slurred speech. Patient with recent admission 08/28/2014 for chest pain and elevated blood pressure with negative troponins urine drug screen was positive at that time for marijuana workup unremarkable. MRI of the brain showed small acute infarct posterior limb of the right internal capsule and corona radiata. MRA without major occlusion or stenosis. Recent echocardiogram with ejection fraction of 65% left ventricular diastolic dysfunction. Carotid Dopplers with no ICA stenosis. Patient did not receive TPA. Neurology services consulted maintained on aspirin for CVA prophylaxis as well as subcutaneous heparin for DVT prophylaxis. Close monitoring of blood pressure. Patient is tolerating a mechanical soft diet. Physical therapy evaluation completed 09/09/2014 with recommendations of physical medicine rehabilitation consult.   Review of Systems  Respiratory: Positive for shortness of breath.   Cardiovascular: Positive for chest pain.  Neurological: Positive for headaches.  Psychiatric/Behavioral:       Anxiety  All other systems reviewed and are negative.  Past Medical History  Diagnosis Date  . Hypertension   . Type II diabetes mellitus   . Anginal pain   . High cholesterol   . Shortness of breath 04/17/2012    "all the time"  . Headache(784.0) 04/17/2012    "~ qod; lately waking up in am w/one"  . Migraines 04/17/2012  . Chronic lower back pain 04/17/2012    "just got over some; catched when I walked"  . Depression   .  Anxiety   . Sleep apnea   . Obesity    Past Surgical History  Procedure Laterality Date  . Cardiac catheterization  ~ 2011   Family History  Problem Relation Age of Onset  . Stroke Brother 1  . Stroke Brother 23  . Heart attack Mother 35   Social History:  reports that she quit smoking about 13 months ago. Her smoking use included Cigarettes. She has a .8 pack-year smoking history. She has never used smokeless tobacco. She reports that she drinks alcohol. She reports that she does not use illicit drugs. Allergies:  Allergies  Allergen Reactions  . Morphine And Related Hives   Medications Prior to Admission  Medication Sig Dispense Refill  . ibuprofen (ADVIL,MOTRIN) 200 MG tablet Take 800 mg by mouth 2 (two) times daily as needed (pain).    Marland Kitchen acetaminophen (TYLENOL) 325 MG tablet Take 650 mg by mouth every 6 (six) hours as needed for headache.    Marland Kitchen amLODipine (NORVASC) 10 MG tablet TAKE 1 TABLET BY MOUTH DAILY FOR HYPERTENSION (Patient not taking: Reported on 09/08/2014) 30 tablet 0  . aspirin EC 81 MG EC tablet Take 1 tablet (81 mg total) by mouth daily. (Patient not taking: Reported on 09/08/2014) 30 tablet 0  . glipiZIDE (GLUCOTROL) 10 MG tablet Take 1 tablet (10 mg total) by mouth 2 (two) times daily before a meal. (Patient not taking: Reported on 09/08/2014) 60 tablet 5  . lisinopril-hydrochlorothiazide (ZESTORETIC) 20-12.5 MG per tablet Take 1 tablet by mouth daily. (Patient not taking: Reported on 09/08/2014) 30 tablet 0  . lovastatin (MEVACOR) 40 MG tablet Take 1 tablet (40 mg total) by mouth at bedtime. (Patient not  taking: Reported on 09/08/2014) 30 tablet 0  . metFORMIN (GLUCOPHAGE) 1000 MG tablet Take 1 tablet (1,000 mg total) by mouth 2 (two) times daily with a meal. (Patient not taking: Reported on 09/08/2014) 60 tablet 0    Home: Home Living Family/patient expects to be discharged to:: Private residence Living Arrangements: Non-relatives/Friends Available Help at  Discharge: Friend(s) Type of Home: House Home Equipment: None Additional Comments: pt was recently homeless, has recently begun living with some friends, unsure how much support she has or if returning to this home is a good option for her. Will need CSW consult.  Lives With: Friend(s) (pt says she has been staying with friends)  Functional History: Prior Function Level of Independence: Independent Comments: pt works part time as a Quarry manager at a [employer] Functional Status:  Mobility: Bed Mobility Overal bed mobility: Modified Independent General bed mobility comments: pt able to get to EOB safely and independently Transfers Overall transfer level: Needs assistance Equipment used: Rolling walker (2 wheeled), Straight cane Transfers: Sit to/from Stand Sit to Stand: Min guard General transfer comment: no LOB with transfers but pt unsteady to left, min-guard for safety Ambulation/Gait Ambulation/Gait assistance: Min assist Ambulation Distance (Feet): 70 Feet Assistive device: Rolling walker (2 wheeled), Straight cane Gait Pattern/deviations: Step-through pattern, Decreased stance time - left, Decreased step length - left, Decreased weight shift to left Gait velocity: decreased Gait velocity interpretation: Below normal speed for age/gender General Gait Details: pt began ambulation with RW but unable to hold consistently with left hand. Switched to [facility] right side and pt had an easier time with this with no significant compromise to balance. With no AD, pt unsteady to left.    ADL: ADL Overall ADL's : Needs assistance/impaired Eating/Feeding: Set up, Supervision/ safety, Sitting Grooming: Wash/dry face, Wash/dry hands, Oral care, Applying deodorant, Min guard, Standing Upper Body Bathing: Min guard, Standing Upper Body Bathing Details (indicate cue type and reason): cues for technique for RUE Lower Body Bathing: Sit to/from stand, Moderate assistance Upper Body Dressing : Moderate assistance,  Sitting Lower Body Dressing: Moderate assistance, Sit to/from stand Toilet Transfer: Min guard, Ambulation, [facility] Toilet Transfer Details (indicate cue type and reason): holding onto things in room when ambulating Toileting- Clothing Manipulation and Hygiene: Moderate assistance, Sit to/from stand Toileting - Clothing Manipulation Details (indicate cue type and reason): assist with gown and LOB when standing at end. Functional mobility during ADLs: Min guard General ADL Comments: Encouraged pt to be using left hand for activities. Educated on dressing technique. Pt ambulated to bathroom and peformed ADLs. Assist with donning socks sitting on bed.  Cognition: Cognition Overall Cognitive Status: No family/caregiver present to determine baseline cognitive functioning Arousal/Alertness: Awake/alert Orientation Level: Oriented X4 Attention: Sustained Sustained Attention: Appears intact Memory: Impaired Memory Impairment: Other (comment) (working memory) Awareness: Appears intact Problem Solving: Impaired Problem Solving Impairment: Verbal complex Safety/Judgment: Appears intact Cognition Arousal/Alertness: Lethargic Behavior During Therapy: WFL for tasks assessed/performed Overall Cognitive Status: No family/caregiver present to determine baseline cognitive functioning Area of Impairment: Problem solving Problem Solving: Slow processing General Comments: pt with overall dampened responses  Blood pressure 178/95, pulse 87, temperature 98.4 F (36.9 C), temperature source Oral, resp. rate 20, height 5\' 1"  (1.549 m), weight 102.785 kg (226 lb 9.6 oz), last menstrual period 08/28/2014, SpO2 97 %. Physical Exam  Vitals reviewed. Constitutional:  44 year old right-handed obese African-American female  HENT:  Head: Normocephalic.  Eyes: EOM are normal.  Neck: Normal range of motion. Neck supple. No tracheal deviation present.  No thyromegaly present.  Cardiovascular: Normal rate and regular  rhythm.   Respiratory: Effort normal and breath sounds normal. No respiratory distress.  GI: Soft. Bowel sounds are normal. She exhibits no distension.  Neurological: She is alert.  Mood is flat but appropriate. She participates with exam. Mild slurred speech but fully intelligible. She could provide her name, age and date of birth. Followed simple commands.  MMT-- left: 1/5 deltoid, 1/5 bicep, 1/5 tricep, 1/5 wrist extension, 1/5 hand intrinsics.      1+/5 hip flexor, 1+/5 knee extension, 1+/5 ankle dorsiflexion, 1+/5 ankle plantarflexion.  Right: 3/5 deltoid, 4/5 bicep, 4/5 tricep, 4/5 wrist extension, 4/5 hand intrinsics.      3/5 hip flexor, 3/5 knee extension, 4/5 ankle dorsiflexion, 4/5 ankle plantarflexion. sEnsation 1/2 RUE and RLE as well as face. Right central 7 and tongue deviation. Speech very dysarthric. Delayed initiation.    Skin: Skin is warm and dry.    Results for orders placed or performed during the hospital encounter of 09/08/14 (from the past 24 hour(s))  Glucose, capillary     Status: Abnormal   Collection Time: 09/09/14  6:56 AM  Result Value Ref Range   Glucose-Capillary 211 (H) 70 - 99 mg/dL  Glucose, capillary     Status: Abnormal   Collection Time: 09/09/14 11:41 AM  Result Value Ref Range   Glucose-Capillary 191 (H) 70 - 99 mg/dL   Comment 1 Notify RN   Glucose, capillary     Status: Abnormal   Collection Time: 09/09/14  4:38 PM  Result Value Ref Range   Glucose-Capillary 214 (H) 70 - 99 mg/dL   Comment 1 Notify RN   Glucose, capillary     Status: Abnormal   Collection Time: 09/09/14  9:18 PM  Result Value Ref Range   Glucose-Capillary 227 (H) 70 - 99 mg/dL  Basic metabolic panel     Status: Abnormal   Collection Time: 09/10/14  3:41 AM  Result Value Ref Range   Sodium 139 135 - 145 mmol/L   Potassium 3.7 3.5 - 5.1 mmol/L   Chloride 102 96 - 112 mEq/L   CO2 26 19 - 32 mmol/L   Glucose, Bld 175 (H) 70 - 99 mg/dL   BUN 9 6 - 23 mg/dL   Creatinine, Ser  0.96 0.50 - 1.10 mg/dL   Calcium 9.2 8.4 - 10.5 mg/dL   GFR calc non Af Amer 72 (L) >90 mL/min   GFR calc Af Amer 83 (L) >90 mL/min   Anion gap 11 5 - 15   Dg Chest 2 View  09/08/2014   CLINICAL DATA:  Acute onset of shortness of breath and dyspnea. Initial encounter.  EXAM: CHEST  2 VIEW  COMPARISON:  Chest radiograph performed 08/28/2014  FINDINGS: The lungs are well-aerated. Mild left midlung atelectasis or scarring is noted. Mild vascular congestion is seen. There is no evidence of focal opacification, pleural effusion or pneumothorax.  The heart is borderline enlarged. No acute osseous abnormalities are seen.  IMPRESSION: Mild vascular congestion and borderline cardiomegaly. Mild left mid lung atelectasis noted.   Electronically Signed   By: Garald Balding M.D.   On: 09/08/2014 22:49   Ct Head Wo Contrast  09/08/2014   CLINICAL DATA:  Left-sided weakness.  Facial droop.  EXAM: CT HEAD WITHOUT CONTRAST  TECHNIQUE: Contiguous axial images were obtained from the base of the skull through the vertex without intravenous contrast.  COMPARISON:  12/23/2013  FINDINGS: Mild scattered microvascular changes in the  deep white matter, best seen on the right. No acute intracranial abnormality. Specifically, no hemorrhage, hydrocephalus, mass lesion, acute infarction, or significant intracranial injury. No acute calvarial abnormality. Visualized paranasal sinuses and mastoids clear. Orbital soft tissues unremarkable.  IMPRESSION: No acute intracranial abnormality. Mild chronic microvascular disease. This is advanced for patient's age.   Electronically Signed   By: Rolm Baptise M.D.   On: 09/08/2014 10:49   Mr Brain Wo Contrast  09/08/2014   CLINICAL DATA:  Left-sided numbness beginning yesterday. Left facial droop, left grip weakness, and dysarthria today.  EXAM: MRI HEAD WITHOUT CONTRAST  MRA HEAD WITHOUT CONTRAST  TECHNIQUE: Multiplanar, multiecho pulse sequences of the brain and surrounding structures were  obtained without intravenous contrast. Angiographic images of the head were obtained using MRA technique without contrast.  COMPARISON:  Head CT 09/08/2014  FINDINGS: MRI HEAD FINDINGS  There is a small, acute infarct measuring approximately 1.5 cm in the right corona radiata and posterior limb of the right internal capsule. Small focus of susceptibility artifact at the posterior aspect of the right putamen likely represents a small amount of chronic microhemorrhage. Chronic lacunar infarcts are present in the basal ganglia bilaterally and left thalamus. Patchy and confluent T2 hyperintensities throughout subcortical and deep cerebral white matter are nonspecific but compatible with moderate chronic small vessel ischemic disease, advanced for age. There is no evidence of mass, midline shift, or extra-axial fluid collection. Ventricles and sulci are within normal limits for age.  Orbits are unremarkable. Paranasal sinuses and mastoid air cells are clear. Major intracranial vascular flow voids are preserved.  MRA HEAD FINDINGS  Images are moderately degraded by motion artifact.  The visualized distal vertebral arteries are patent but diffusely small in caliber, likely on a congenital basis. The right vertebral artery supplies the basilar and is irregular distally, which may be artifactual due to motion or reflect atherosclerosis. The intracranial left vertebral artery is not well seen, likely due to its small size and motion artifact, and it may end in PICA or be occluded intracranially. The right PICA origin is patent. SCA origins are patent. Basilar artery is patent although small in caliber diffusely and irregular. There may be a mild focal stenosis in the mid basilar artery, although evaluation is limited by its small size and motion artifact.  There are fetal type origins of both PCAs. PCAs are patent and diffusely irregular. No high-grade proximal PCA stenosis is seen, although there may be mild proximal P2  stenoses bilaterally. There is suggestion of a severe distal left P2 stenosis, although the degree of narrowing may be exaggerated by artifact.  The visualized distal cervical ICA on the right is mildly tortuous. Intracranial ICAs are patent from skullbase to carotid termini. There is mild carotid siphon irregularity without evidence of significant stenosis. M1 segments are patent without significant stenosis. There is mild to moderate MCA branch vessel irregularity bilaterally. ACAs are patent but moderately irregular diffusely with a likely mild mid left A1 stenosis. No intracranial aneurysm is identified.  IMPRESSION: 1. Small, acute infarct in the posterior limb of the right internal capsule and corona radiata. 2. Moderate chronic small vessel ischemic disease, advanced for age. Chronic lacunar infarcts in the deep gray nuclei. 3. Moderately motion degraded head MRA without evidence of major vessel occlusion or high-grade proximal stenosis in the anterior circulation. Diffuse vessel irregularity likely reflects a combination atherosclerosis and motion artifact. 4. Diffusely small caliber of the distal vertebral arteries and basilar artery on a congenital basis. Intracranial  left vertebral artery is not well seen and may be occluded or end in PICA.   Electronically Signed   By: Logan Bores   On: 09/08/2014 17:56   Mr Jodene Nam Head/brain Wo Cm  09/08/2014   CLINICAL DATA:  Left-sided numbness beginning yesterday. Left facial droop, left grip weakness, and dysarthria today.  EXAM: MRI HEAD WITHOUT CONTRAST  MRA HEAD WITHOUT CONTRAST  TECHNIQUE: Multiplanar, multiecho pulse sequences of the brain and surrounding structures were obtained without intravenous contrast. Angiographic images of the head were obtained using MRA technique without contrast.  COMPARISON:  Head CT 09/08/2014  FINDINGS: MRI HEAD FINDINGS  There is a small, acute infarct measuring approximately 1.5 cm in the right corona radiata and posterior  limb of the right internal capsule. Small focus of susceptibility artifact at the posterior aspect of the right putamen likely represents a small amount of chronic microhemorrhage. Chronic lacunar infarcts are present in the basal ganglia bilaterally and left thalamus. Patchy and confluent T2 hyperintensities throughout subcortical and deep cerebral white matter are nonspecific but compatible with moderate chronic small vessel ischemic disease, advanced for age. There is no evidence of mass, midline shift, or extra-axial fluid collection. Ventricles and sulci are within normal limits for age.  Orbits are unremarkable. Paranasal sinuses and mastoid air cells are clear. Major intracranial vascular flow voids are preserved.  MRA HEAD FINDINGS  Images are moderately degraded by motion artifact.  The visualized distal vertebral arteries are patent but diffusely small in caliber, likely on a congenital basis. The right vertebral artery supplies the basilar and is irregular distally, which may be artifactual due to motion or reflect atherosclerosis. The intracranial left vertebral artery is not well seen, likely due to its small size and motion artifact, and it may end in PICA or be occluded intracranially. The right PICA origin is patent. SCA origins are patent. Basilar artery is patent although small in caliber diffusely and irregular. There may be a mild focal stenosis in the mid basilar artery, although evaluation is limited by its small size and motion artifact.  There are fetal type origins of both PCAs. PCAs are patent and diffusely irregular. No high-grade proximal PCA stenosis is seen, although there may be mild proximal P2 stenoses bilaterally. There is suggestion of a severe distal left P2 stenosis, although the degree of narrowing may be exaggerated by artifact.  The visualized distal cervical ICA on the right is mildly tortuous. Intracranial ICAs are patent from skullbase to carotid termini. There is mild  carotid siphon irregularity without evidence of significant stenosis. M1 segments are patent without significant stenosis. There is mild to moderate MCA branch vessel irregularity bilaterally. ACAs are patent but moderately irregular diffusely with a likely mild mid left A1 stenosis. No intracranial aneurysm is identified.  IMPRESSION: 1. Small, acute infarct in the posterior limb of the right internal capsule and corona radiata. 2. Moderate chronic small vessel ischemic disease, advanced for age. Chronic lacunar infarcts in the deep gray nuclei. 3. Moderately motion degraded head MRA without evidence of major vessel occlusion or high-grade proximal stenosis in the anterior circulation. Diffuse vessel irregularity likely reflects a combination atherosclerosis and motion artifact. 4. Diffusely small caliber of the distal vertebral arteries and basilar artery on a congenital basis. Intracranial left vertebral artery is not well seen and may be occluded or end in PICA.   Electronically Signed   By: Logan Bores   On: 09/08/2014 17:56    Assessment/Plan: Diagnosis: Right PLIC/CR infarct 1.  Does the need for close, 24 hr/day medical supervision in concert with the patient's rehab needs make it unreasonable for this patient to be served in a less intensive setting? Yes 2. Co-Morbidities requiring supervision/potential complications: dm, obesity 3. Due to bladder management, bowel management, safety, skin/wound care, disease management, medication administration, pain management and patient education, does the patient require 24 hr/day rehab nursing? Yes 4. Does the patient require coordinated care of a physician, rehab nurse, PT (1-2 hrs/day, 5 days/week), OT (1-2 hrs/day, 5 days/week) and SLP (1-2 hrs/day, 5 days/week) to address physical and functional deficits in the context of the above medical diagnosis(es)? Yes Addressing deficits in the following areas: balance, endurance, locomotion, strength,  transferring, bowel/bladder control, bathing, dressing, feeding, grooming, toileting, cognition, speech, language, swallowing and psychosocial support 5. Can the patient actively participate in an intensive therapy program of at least 3 hrs of therapy per day at least 5 days per week? Yes 6. The potential for patient to make measurable gains while on inpatient rehab is excellent 7. Anticipated functional outcomes upon discharge from inpatient rehab are modified independent and supervision  with PT, modified independent and supervision with OT, modified independent and supervision with SLP. 8. Estimated rehab length of stay to reach the above functional goals is: 17-21 days 9. Does the patient have adequate social supports and living environment to accommodate these discharge functional goals? Yes and Potentially 10. Anticipated D/C setting: Home 11. Anticipated post D/C treatments: HH therapy and Outpatient therapy 12. Overall Rehab/Functional Prognosis: excellent  RECOMMENDATIONS: This patient's condition is appropriate for continued rehabilitative care in the following setting: CIR Patient has agreed to participate in recommended program. Yes Note that insurance prior authorization may be required for reimbursement for recommended care.  Comment: Rehab Admissions Coordinator to follow up.  Thanks,  Meredith Staggers, MD, Mellody Drown     09/10/2014

## 2014-09-10 NOTE — Progress Notes (Signed)
Family Medicine Teaching Service Daily Progress Note Intern Pager: [phone]  Patient name: Ski Polich Medical record number: [id number] Date of birth: 1970/09/18 Age: 44 y.o. Gender: female  Primary Care Provider: Janora Norlander, DO Consultants: neuro Code Status: full  Pt Overview and Major Events to Date:  1/12 - admit for L sided weakness and slurred speech  Assessment and Plan: Kissa Campoy is a 44 y.o. female presenting with L-sided weakness and slurred speech for the past 16 hours. PMH is significant for DM, HTN, HLD, substance abuse and recent chest pain admission.  # Acute stroke R PLIC infarct 2/2 small vessel disease - L sided weakness, slurred speech. Risk factors include DM, HTN, HLD, strong family history (2 brothers with strokes in their 28s). CT neg in ED.  - MRI/MRA brain - confirms acute stroke - carotid duplex - b/l 1-39% ICA stenosis - echo 08/29/14 - EF 90-24%, LV diastolic dysfunction, moderate LA dilation, no ASD/PFO - A1c and lipids done 08/28/14 - PT/OT rec CIR - eval pending - SLP rec HH SLP and Dys 3 diet - ASA 325 - neuro recs changing to Plavix (patient unlikely to be able to afford Plavix) - neuro consulting, appreciate recs  # Dyspnea: Episode of acute dyspnea 1/13, likely related to anxiety. Resolved with ativan - EKG unchanged, CXR no infiltrate, trops neg x2 - Continue to monitor  # Hypokalemia: Resolved s/p 54mEq Kdur  # DM: A1c 10.3 on 08/28/14 - hold orals, patient not taking at home due to cost - moderate SSI - required 10 units yesterday - CBG monitoring - 155-227 O/N  # HTN: Poorly controlled. Allowed permissive HTN O/N - Continue home amlodipine, lisinopril - Resume home HCTZ today - IV labetolol prn  # HLD: lipid panel 08/28/14 showed LDL 125, HDL 45 and total cholesterol 196 - Increased to high intensity statin - atorva 40 daily  # Social: Patient is uninsured and has been noncompliant with meds due to cost - consult CM  for medication assistance  FEN/GI: Dys 3 diet Prophylaxis: SQ heparin  Disposition: pending possible placement. Will c/s SW for possible application to medicaid and disability and possible SNF placement.  Being evaluated by CIR.  Subjective:  Weakness stable. Swallowing somewhat better.   Objective: Temp:  [98.1 F (36.7 C)-99.2 F (37.3 C)] 98.1 F (36.7 C) (01/14 0643) Pulse Rate:  [86-96] 87 (01/14 0643) Resp:  [19-20] 20 (01/14 0643) BP: (134-178)/(74-95) 134/74 mmHg (01/14 0643) SpO2:  [96 %-100 %] 100 % (01/14 0973) Physical Exam: General: obese middle-aged woman, lying in bed, NAD HEENT: MMM, oropharynx clear, PERRL, EOMI, left cheek and mouth drooping Cardiovascular: RRR, II/VI SEM, 2+ dp pulses Respiratory: CTAB, NWOB Abdomen: soft, NTND, normal BS Extremities: WWP, no LE edema Neuro: 4/5 strength LUE, 4/5 LLE, CN 2-12 intact  Laboratory:  Recent Labs Lab 09/08/14 1011 09/09/14 0341 09/10/14 0341  WBC 10.4 10.6* 11.3*  HGB 12.4 11.5* 11.9*  HCT 38.8 35.7* 37.2  PLT 361 340 377    Recent Labs Lab 09/08/14 1011 09/09/14 0341 09/10/14 0341  NA 136 134* 139  K 3.5 3.1* 3.7  CL 102 101 102  CO2 26 26 26   BUN 7 10 9   CREATININE 0.90 0.99 0.96  CALCIUM 8.9 8.6 9.2  PROT 7.1  --   --   BILITOT 0.5  --   --   ALKPHOS 70  --   --   ALT 11  --   --   AST  16  --   --   GLUCOSE 225* 194* 175*    trops neg x2  Lipid Panel     Component Value Date/Time   CHOL 196 08/28/2014 1605   TRIG 129 08/28/2014 1605   HDL 45 08/28/2014 1605   CHOLHDL 4.4 08/28/2014 1605   VLDL 26 08/28/2014 1605   LDLCALC 125* 08/28/2014 1605    A1c (08/28/14): 10.3  Imaging/Diagnostic Tests: CT head: No acute intracranial abnormality. Mild chronic microvascular disease. This is advanced for patient's age.  MRI/MRA (1/12): 1. Small, acute infarct in the posterior limb of the right internal capsule and corona radiata. 2. Moderate chronic small vessel ischemic disease,  advanced for age. Chronic lacunar infarcts in the deep gray nuclei. 3. Moderately motion degraded head MRA without evidence of major vessel occlusion or high-grade proximal stenosis in the anterior circulation. Diffuse vessel irregularity likely reflects a combination atherosclerosis and motion artifact. 4. Diffusely small caliber of the distal vertebral arteries and basilar artery on a congenital basis. Intracranial left vertebral artery is not well seen and may be occluded or end in PICA.  CXR (1/12): Mild vascular congestion and borderline cardiomegaly. Mild left mid lung atelectasis noted.  EKG (1/12): NSR, T wave abnormality, unchanged from previous  Lavon Paganini, MD 09/10/2014, 8:28 AM PGY-1, [place] Intern pager: [phone], text pages welcome

## 2014-09-10 NOTE — Progress Notes (Addendum)
Inpatient Diabetes Program Recommendations  AACE/ADA: New Consensus Statement on Inpatient Glycemic Control (2013)  Target Ranges:  Prepandial:   less than 140 mg/dL      Peak postprandial:   less than 180 mg/dL (1-2 hours)      Critically ill patients:  140 - 180 mg/dL   HgbA1C at 10.3%. According to ADA standards, A1C greater than 9% recommends using a basal/bolus approach to control.  Care management has noted that patient can to to [facility] for her medications and medical follow-up. Medicaid is also pending. Would be helpful to establish an effective regimen while here before discharge. Inpatient Diabetes Program Recommendations Insulin - Basal: lPt would benefit from addition of basl levemir/lantus of 15 units -.   Correction (SSI): Would continue the moderate correction tidwc . Insulin - Meal Coverage: (may need meal coverage once eating and/if correction alone does not control post-prandials  Thank you, Rosita Kea, RN, CNS, Diabetes Coordinator [phone])

## 2014-09-10 NOTE — Progress Notes (Signed)
Physical Therapy Treatment Patient Details Name: Diamond Rice MRN: [id number] DOB: 1971-08-28 Today's Date: 09/10/2014    History of Present Illness 44 y/o female with PMH T2DM, HTN, HLD, substance abuse, and medication noncompliance due to financial hardship. Recently admitted for accelerated HTN and ACS rule out. She has not been able to pick up any of her prescribed medications. Presents with 1 day history of left sided numbness, facial droop, and slurred speech. OVZ:CHYIF, acute infarct in the posterior limb of the right internal capsule and corona radiata and moderate chronic small vessel ischemic disease, advanced for age as well as chronic lacunar infarcts in the deep gray nuclei.    PT Comments    Pt making great progress with therapy.  Worked on gait without AD in order to challenge balance and encourage WB and weight shift to LLE.  Also performed balance/NMR tasks with sit<>stands and toe taps.  Pt continues to be great CIR candidate.   Follow Up Recommendations  CIR     Equipment Recommendations  Other (comment)    Recommendations for Other Services OT consult;Rehab consult     Precautions / Restrictions Precautions Precautions: Fall Precaution Comments: L hemiparesis Restrictions Weight Bearing Restrictions: No    Mobility  Bed Mobility               General bed mobility comments: Pt in recliner when PT arrived.   Transfers Overall transfer level: Needs assistance Equipment used: None Transfers: Sit to/from Stand           General transfer comment: Had pt perform all transfers without AD to further challenge balance and L quad/glute strength.    Ambulation/Gait Ambulation/Gait assistance: Min assist Ambulation Distance (Feet): 100 Feet Assistive device: 1 person hand held assist Gait Pattern/deviations: Step-through pattern;Decreased stride length;Decreased step length - left;Decreased stance time - left;Decreased weight shift to  left;Trendelenburg;Trunk flexed Gait velocity: decreased   General Gait Details: Pt able to tolerate ambulation without AD very well with min cues and facilitation for increased L weight shift, L WB and upright posture.     Stairs            Wheelchair Mobility    Modified Rankin (Stroke Patients Only) Modified Rankin (Stroke Patients Only) Pre-Morbid Rankin Score: No symptoms Modified Rankin: Moderately severe disability     Balance Overall balance assessment: Needs assistance Sitting-balance support: Feet supported Sitting balance-Leahy Scale: Good     Standing balance support: During functional activity;No upper extremity supported Standing balance-Leahy Scale: Fair               High level balance activites: Other (comment) (toe taps with R then LLE for balance and increased WB LLE)      Cognition Arousal/Alertness: Awake/alert Behavior During Therapy: WFL for tasks assessed/performed Overall Cognitive Status: No family/caregiver present to determine baseline cognitive functioning Area of Impairment: Memory;Safety/judgement;Awareness     Memory: Decreased recall of precautions   Safety/Judgement: Decreased awareness of safety Awareness: Emergent        Exercises      General Comments General comments (skin integrity, edema, etc.): Also performed sit<>stand x 10 reps in order to increase L quad and glute strength.       Pertinent Vitals/Pain Pain Assessment: No/denies pain    Home Living                      Prior Function            PT Goals (current  goals can now be found in the care plan section) Acute Rehab PT Goals Patient Stated Goal: not stated PT Goal Formulation: With patient Time For Goal Achievement: 09/23/14 Potential to Achieve Goals: Good Progress towards PT goals: Progressing toward goals    Frequency  Min 4X/week    PT Plan Current plan remains appropriate    Co-evaluation             End of  Session Equipment Utilized During Treatment: Gait belt Activity Tolerance: Patient tolerated treatment well Patient left: in chair;with chair alarm set;with call bell/phone within reach     Time: 1201-1217 PT Time Calculation (min) (ACUTE ONLY): 16 min  Charges:  $Gait Training: 8-22 mins                    G Codes:      Denice Bors 09/10/2014, 12:29 PM

## 2014-09-10 NOTE — Progress Notes (Signed)
Talked to patient about follow up medical care; patient goes to the [facility] for medical care/ patient is able to get her medication through the clinic also; Patient was working part time at [employer] and lives with friends; Noted Consult for Disability/ Medicaid - the Financial Counselor will see the patient to determine what she qualifies for; DCP for possible inpatient rehab placement; Aneta Mins [phone]

## 2014-09-10 NOTE — Progress Notes (Signed)
Rehab admissions - I met with patient about possible acute inpatient rehab admission.  Patient would like to admit to inpatient rehab.  I will have a bed available tomorrow, Friday on rehab.  Patient does not have insurance.  She plans to apply for medicaid and disability.  I will follow up in am for potential inpatient rehab admission.  Call me for questions.  #[phone]

## 2014-09-10 NOTE — Clinical Social Work Psychosocial (Addendum)
Clinical Social Work Department BRIEF PSYCHOSOCIAL ASSESSMENT 09/10/2014  Patient:  Diamond Rice, Diamond Rice     Account Number:  [id number]     Admit date:  09/08/2014  Clinical Social Worker:  Marciano Sequin  Date/Time:  09/10/2014 11:50 AM  Referred by:  RN  Date Referred:  09/10/2014 Referred for  SNF Placement   Other Referral:   Interview type:  Patient Other interview type:    PSYCHOSOCIAL DATA Living Status: Friend Admitted from facility:   Level of care:   Primary support name:  Holley Raring  Primary support relationship to patient:  Friend  Degree of support available:  [place]  Other Concerns:    SOCIAL WORK ASSESSMENT / PLAN CSW met the pt at the bedside. CSW introduced self and purpose of the visit. CSW and pt discussed the alternative placement if CIR cannot accept her. CSW explained the Medicaid LOG SNF process to the pt. CSW provided pt with a LOG SNF list. CSW explained insurance component and its relation to SNF placement.  CSW answered all questions in which the pt inquired about. CSW provided the pt with contact information for further questions. CSW will continue to follow this pt and assist with discharge as needed.   Assessment/plan status:  Psychosocial Support/Ongoing Assessment of Needs Other assessment/ plan:   The financial counselor will assist the pt with Medicaid application.   Information/referral to community resources:    PATIENT'S/FAMILY'S RESPONSE TO PLAN OF CARE: The pt presented with a normal mood and affect. Pt oriented 4x. 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 The pt reported being unsure if she should take that chance of going to a SNF knowing that she may not receive rehab or if she should go home. The pt understands the clinical team's recommendation for rehab, but needs time to weigh her options.   [place],  MSW, [place]

## 2014-09-10 NOTE — Progress Notes (Signed)
Speech Language Pathology Treatment: Dysphagia;Cognitive-Linquistic  Patient Details Name: Diamond Rice MRN: [id number] DOB: 19-May-1971 Today's Date: 09/10/2014 Time: 5009-3818 SLP Time Calculation (min) (ACUTE ONLY): 23 min  Assessment / Plan / Recommendation Clinical Impression  Pt consumed regular textures and thin liquids with Min cues from SLP for slower rate of intake. Pt with complaints of biting her tongue, however without overt signs of aspiration or significant residue.   Pt demonstrated alternating attention between newspaper article and functional conversation with Mod I and extra time. SLP provided Min-Mod cues for emergent awareness of speech intelligibility, and Mod cues for use of speech strategies within structured task.    HPI HPI: Diamond Rice is a 44 y.o. female presenting to [facility] ED after waking up with left facial droop and left hand grip weakness. On arrival to ED BP 197/119.MRI positive for small, acute infarct in the posterior limb of the right internal capsule and corona radiata.   Pertinent Vitals Pain Assessment: No/denies pain  SLP Plan  Continue with current plan of care    Recommendations Diet recommendations: Dysphagia 3 (mechanical soft);Thin liquid Liquids provided via: Cup;Straw Medication Administration: Whole meds with puree Supervision: Patient able to self feed;Intermittent supervision to cue for compensatory strategies Compensations: Slow rate;Small sips/bites;Check for pocketing Postural Changes and/or Swallow Maneuvers: Seated upright 90 degrees;Upright 30-60 min after meal              Oral Care Recommendations: Oral care BID Follow up Recommendations: Inpatient Rehab Plan: Continue with current plan of care    GO      Germain Osgood, M.A. CCC-SLP [phone]  Germain Osgood 09/10/2014, 12:30 PM

## 2014-09-11 ENCOUNTER — Inpatient Hospital Stay (HOSPITAL_COMMUNITY)
Admission: RE | Admit: 2014-09-11 | Discharge: 2014-09-26 | DRG: 057 | Disposition: A | Discharge status: Home Health | Payer: Self-pay | Source: Intra-hospital | Attending: Physical Medicine & Rehabilitation | Admitting: Physical Medicine & Rehabilitation

## 2014-09-11 DIAGNOSIS — I69391 Dysphagia following cerebral infarction: Secondary | ICD-10-CM

## 2014-09-11 DIAGNOSIS — E785 Hyperlipidemia, unspecified: Secondary | ICD-10-CM | POA: Diagnosis present

## 2014-09-11 DIAGNOSIS — G8194 Hemiplegia, unspecified affecting left nondominant side: Secondary | ICD-10-CM

## 2014-09-11 DIAGNOSIS — F419 Anxiety disorder, unspecified: Secondary | ICD-10-CM | POA: Diagnosis present

## 2014-09-11 DIAGNOSIS — E118 Type 2 diabetes mellitus with unspecified complications: Secondary | ICD-10-CM | POA: Diagnosis present

## 2014-09-11 DIAGNOSIS — I6381 Other cerebral infarction due to occlusion or stenosis of small artery: Secondary | ICD-10-CM | POA: Diagnosis present

## 2014-09-11 DIAGNOSIS — F329 Major depressive disorder, single episode, unspecified: Secondary | ICD-10-CM | POA: Diagnosis present

## 2014-09-11 DIAGNOSIS — Z598 Other problems related to housing and economic circumstances: Secondary | ICD-10-CM

## 2014-09-11 DIAGNOSIS — G4730 Sleep apnea, unspecified: Secondary | ICD-10-CM | POA: Diagnosis present

## 2014-09-11 DIAGNOSIS — I638 Other cerebral infarction: Secondary | ICD-10-CM

## 2014-09-11 DIAGNOSIS — E1142 Type 2 diabetes mellitus with diabetic polyneuropathy: Secondary | ICD-10-CM | POA: Diagnosis present

## 2014-09-11 DIAGNOSIS — Z91120 Patient's intentional underdosing of medication regimen due to financial hardship: Secondary | ICD-10-CM | POA: Diagnosis present

## 2014-09-11 DIAGNOSIS — R0781 Pleurodynia: Secondary | ICD-10-CM | POA: Diagnosis present

## 2014-09-11 DIAGNOSIS — I639 Cerebral infarction, unspecified: Secondary | ICD-10-CM | POA: Diagnosis present

## 2014-09-11 DIAGNOSIS — R1310 Dysphagia, unspecified: Secondary | ICD-10-CM | POA: Diagnosis present

## 2014-09-11 DIAGNOSIS — R110 Nausea: Secondary | ICD-10-CM | POA: Diagnosis not present

## 2014-09-11 DIAGNOSIS — I69322 Dysarthria following cerebral infarction: Secondary | ICD-10-CM

## 2014-09-11 DIAGNOSIS — I69354 Hemiplegia and hemiparesis following cerebral infarction affecting left non-dominant side: Principal | ICD-10-CM

## 2014-09-11 DIAGNOSIS — E6609 Other obesity due to excess calories: Secondary | ICD-10-CM | POA: Diagnosis present

## 2014-09-11 DIAGNOSIS — Z6841 Body Mass Index (BMI) 40.0 and over, adult: Secondary | ICD-10-CM

## 2014-09-11 DIAGNOSIS — I10 Essential (primary) hypertension: Secondary | ICD-10-CM | POA: Diagnosis present

## 2014-09-11 DIAGNOSIS — E780 Pure hypercholesterolemia: Secondary | ICD-10-CM | POA: Diagnosis present

## 2014-09-11 DIAGNOSIS — Z87891 Personal history of nicotine dependence: Secondary | ICD-10-CM

## 2014-09-11 LAB — GLUCOSE, CAPILLARY
GLUCOSE-CAPILLARY: 207 mg/dL — AB (ref 70–99)
GLUCOSE-CAPILLARY: 272 mg/dL — AB (ref 70–99)
Glucose-Capillary: 186 mg/dL — ABNORMAL HIGH (ref 70–99)
Glucose-Capillary: 247 mg/dL — ABNORMAL HIGH (ref 70–99)

## 2014-09-11 LAB — CREATININE, SERUM
CREATININE: 1.05 mg/dL (ref 0.50–1.10)
GFR calc non Af Amer: 65 mL/min — ABNORMAL LOW (ref >90)
GFR, EST AFRICAN AMERICAN: 75 mL/min — AB (ref >90)

## 2014-09-11 LAB — CBC
HCT: 38.8 % (ref 36.0–46.0)
Hemoglobin: 12.7 g/dL (ref 12.0–15.0)
MCH: 23.6 pg — ABNORMAL LOW (ref 26.0–34.0)
MCHC: 32.7 g/dL (ref 30.0–36.0)
MCV: 72 fL — ABNORMAL LOW (ref 78.0–100.0)
PLATELETS: 380 10*3/uL (ref 150–400)
RBC: 5.39 MIL/uL — ABNORMAL HIGH (ref 3.87–5.11)
RDW: 15.6 % — ABNORMAL HIGH (ref 11.5–15.5)
WBC: 9.5 10*3/uL (ref 4.0–10.5)

## 2014-09-11 LAB — FACTOR 5 LEIDEN

## 2014-09-11 LAB — PROTHROMBIN GENE MUTATION

## 2014-09-11 MED ORDER — ATORVASTATIN CALCIUM 40 MG PO TABS: 40.0000 mg | ORAL_TABLET | Freq: Every day | ORAL | Status: DC

## 2014-09-11 MED ORDER — ONDANSETRON HCL 4 MG PO TABS
4.0000 mg | ORAL_TABLET | Freq: Four times a day (QID) | ORAL | Status: DC | PRN
  Administered 2014-09-13 – 2014-09-21 (×2): 4 mg via ORAL
  Filled 2014-09-11 (×2): qty 1

## 2014-09-11 MED ORDER — HEPARIN SODIUM (PORCINE) 5000 UNIT/ML IJ SOLN: 5000.0000 [IU] | Freq: Three times a day (TID) | INTRAMUSCULAR | Status: DC

## 2014-09-11 MED ORDER — HEPARIN SODIUM (PORCINE) 5000 UNIT/ML IJ SOLN
5000.0000 [IU] | Freq: Three times a day (TID) | INTRAMUSCULAR | Status: DC
  Administered 2014-09-11 – 2014-09-26 (×44): 5000 [IU] via SUBCUTANEOUS
  Filled 2014-09-11 (×49): qty 1

## 2014-09-11 MED ORDER — LISINOPRIL 20 MG PO TABS: 20.0000 mg | ORAL_TABLET | Freq: Every day | ORAL | Status: DC

## 2014-09-11 MED ORDER — SENNOSIDES-DOCUSATE SODIUM 8.6-50 MG PO TABS
1.0000 | ORAL_TABLET | Freq: Every evening | ORAL | Status: DC | PRN
  Administered 2014-09-25: 1 via ORAL
  Filled 2014-09-11: qty 1

## 2014-09-11 MED ORDER — SENNOSIDES-DOCUSATE SODIUM 8.6-50 MG PO TABS: 1.0000 | ORAL_TABLET | Freq: Every evening | ORAL | Status: DC | PRN

## 2014-09-11 MED ORDER — ATORVASTATIN CALCIUM 40 MG PO TABS
40.0000 mg | ORAL_TABLET | Freq: Every day | ORAL | Status: DC
  Administered 2014-09-11 – 2014-09-25 (×15): 40 mg via ORAL
  Filled 2014-09-11 (×16): qty 1

## 2014-09-11 MED ORDER — LISINOPRIL 20 MG PO TABS
20.0000 mg | ORAL_TABLET | Freq: Every day | ORAL | Status: DC
Start: 2014-09-12 — End: 2014-09-26
  Administered 2014-09-12 – 2014-09-26 (×15): 20 mg via ORAL
  Filled 2014-09-11 (×17): qty 1

## 2014-09-11 MED ORDER — ONDANSETRON HCL 4 MG/2ML IJ SOLN: 4.0000 mg | Freq: Four times a day (QID) | INTRAMUSCULAR | Status: DC | PRN

## 2014-09-11 MED ORDER — ACETAMINOPHEN 325 MG PO TABS
325.0000 mg | ORAL_TABLET | ORAL | Status: DC | PRN
  Administered 2014-09-12 – 2014-09-21 (×5): 650 mg via ORAL
  Filled 2014-09-11 (×6): qty 2

## 2014-09-11 MED ORDER — ASPIRIN 325 MG PO TABS
325.0000 mg | ORAL_TABLET | Freq: Every day | ORAL | Status: DC
  Administered 2014-09-12 – 2014-09-26 (×15): 325 mg via ORAL
  Filled 2014-09-11 (×17): qty 1

## 2014-09-11 MED ORDER — SORBITOL 70 % SOLN
30.0000 mL | Freq: Every day | Status: DC | PRN
  Administered 2014-09-23 – 2014-09-25 (×2): 30 mL via ORAL
  Filled 2014-09-11 (×3): qty 30

## 2014-09-11 MED ORDER — HYDROCHLOROTHIAZIDE 25 MG PO TABS: 25.0000 mg | ORAL_TABLET | Freq: Every day | ORAL | Status: DC

## 2014-09-11 MED ORDER — INSULIN ASPART 100 UNIT/ML ~~LOC~~ SOLN
0.0000 [IU] | Freq: Three times a day (TID) | SUBCUTANEOUS | Status: DC
  Administered 2014-09-11: 5 [IU] via SUBCUTANEOUS
  Administered 2014-09-12 – 2014-09-13 (×4): 3 [IU] via SUBCUTANEOUS
  Administered 2014-09-13: 5 [IU] via SUBCUTANEOUS
  Administered 2014-09-13 – 2014-09-14 (×3): 3 [IU] via SUBCUTANEOUS
  Administered 2014-09-14: 5 [IU] via SUBCUTANEOUS
  Administered 2014-09-15: 2 [IU] via SUBCUTANEOUS
  Administered 2014-09-15: 3 [IU] via SUBCUTANEOUS
  Administered 2014-09-16: 5 [IU] via SUBCUTANEOUS
  Administered 2014-09-17: 15 [IU] via SUBCUTANEOUS
  Administered 2014-09-18: 3 [IU] via SUBCUTANEOUS
  Administered 2014-09-18 – 2014-09-22 (×6): 2 [IU] via SUBCUTANEOUS
  Administered 2014-09-23 – 2014-09-24 (×2): 5 [IU] via SUBCUTANEOUS
  Administered 2014-09-25: 3 [IU] via SUBCUTANEOUS
  Administered 2014-09-25 – 2014-09-26 (×2): 2 [IU] via SUBCUTANEOUS

## 2014-09-11 MED ORDER — HYDROCHLOROTHIAZIDE 25 MG PO TABS
25.0000 mg | ORAL_TABLET | Freq: Every day | ORAL | Status: DC
  Administered 2014-09-12 – 2014-09-26 (×15): 25 mg via ORAL
  Filled 2014-09-11 (×17): qty 1

## 2014-09-11 MED ORDER — AMLODIPINE BESYLATE 10 MG PO TABS
10.0000 mg | ORAL_TABLET | Freq: Every day | ORAL | Status: DC
  Administered 2014-09-12 – 2014-09-26 (×15): 10 mg via ORAL
  Filled 2014-09-11 (×17): qty 1

## 2014-09-11 MED ORDER — ASPIRIN 325 MG PO TABS: 325.0000 mg | ORAL_TABLET | Freq: Every day | ORAL | Status: DC

## 2014-09-11 NOTE — Discharge Instructions (Signed)
Ischemic Stroke °A stroke (cerebrovascular accident) is the sudden death of brain tissue. It is a medical emergency. A stroke can cause permanent loss of brain function. This can cause problems with different parts of your body. A transient ischemic attack (TIA) is different because it does not cause permanent damage. A TIA is a short-lived problem of poor blood flow affecting a part of the brain. A TIA is also a serious problem because having a TIA greatly increases the chances of having a stroke. When symptoms first develop, you cannot know if the problem might be a stroke or a TIA. °CAUSES  °A stroke is caused by a decrease of oxygen supply to an area of your brain. It is usually the result of a small blood clot or collection of cholesterol or fat (plaque) that blocks blood flow in the brain. A stroke can also be caused by blocked or damaged carotid arteries.  °RISK FACTORS °· High blood pressure (hypertension). °· High cholesterol. °· Diabetes mellitus. °· Heart disease. °· The buildup of plaque in the blood vessels (peripheral artery disease or atherosclerosis). °· The buildup of plaque in the blood vessels providing blood and oxygen to the brain (carotid artery stenosis). °· An abnormal heart rhythm (atrial fibrillation). °· Obesity. °· Smoking. °· Taking oral contraceptives (especially in combination with smoking). °· Physical inactivity. °· A diet high in fats, salt (sodium), and calories. °· Alcohol use. °· Use of illegal drugs (especially cocaine and methamphetamine). °· Being African American. °· Being over the age of 55. °· Family history of stroke. °· Previous history of blood clots, stroke, TIA, or heart attack. °· Sickle cell disease. °SYMPTOMS  °These symptoms usually develop suddenly, or may be newly present upon awakening from sleep: °· Sudden weakness or numbness of the face, arm, or leg, especially on one side of the body. °· Sudden trouble walking or difficulty moving arms or legs. °· Sudden  confusion. °· Sudden personality changes. °· Trouble speaking (aphasia) or understanding. °· Difficulty swallowing. °· Sudden trouble seeing in one or both eyes. °· Double vision. °· Dizziness. °· Loss of balance or coordination. °· Sudden severe headache with no known cause. °· Trouble reading or writing. °DIAGNOSIS  °Your health care provider can often determine the presence or absence of a stroke based on your symptoms, history, and physical exam. Computed tomography (CT) of the brain is usually performed to confirm the stroke, determine causes, and determine stroke severity. Other tests may be done to find the cause of the stroke. These tests may include: °· Electrocardiography. °· Continuous heart monitoring. °· Echocardiography. °· Carotid ultrasonography. °· Magnetic resonance imaging (MRI). °· A scan of the brain circulation. °· Blood tests. °PREVENTION  °The risk of a stroke can be decreased by appropriately treating high blood pressure, high cholesterol, diabetes, heart disease, and obesity and by quitting smoking, limiting alcohol, and staying physically active. °TREATMENT  °Time is of the essence. It is important to seek treatment at the first sign of these symptoms because you may receive a medicine to dissolve the clot (thrombolytic) that cannot be given if too much time has passed since your symptoms began. Even if you do not know when your symptoms began, get treatment as soon as possible as there are other treatment options available including oxygen, intravenous (IV) fluids, and medicines to thin the blood (anticoagulants). Treatment of stroke depends on the duration, severity, and cause of your symptoms. Medicines and dietary changes may be used to address diabetes, high blood   pressure, and other risk factors. Physical, speech, and occupational therapists will assess you and work with you to improve any functions impaired by the stroke. Measures will be taken to prevent short-term and long-term  complications, including infection from breathing foreign material into the lungs (aspiration pneumonia), blood clots in the legs, bedsores, and falls. Rarely, surgery may be needed to remove large blood clots or to open up blocked arteries. °HOME CARE INSTRUCTIONS  °· Take medicines only as directed by your health care provider. Follow the directions carefully. Medicines may be used to control risk factors for a stroke. Be sure you understand all your medicine instructions. °· You may be told to take a medicine to thin the blood, such as aspirin or the anticoagulant warfarin. Warfarin needs to be taken exactly as instructed. °¨ Too much and too little warfarin are both dangerous. Too much warfarin increases the risk of bleeding. Too little warfarin continues to allow the risk for blood clots. While taking warfarin, you will need to have regular blood tests to measure your blood clotting time. These blood tests usually include both the PT and INR tests. The PT and INR results allow your health care provider to adjust your dose of warfarin. The dose can change for many reasons. It is critically important that you take warfarin exactly as prescribed, and that you have your PT and INR levels drawn exactly as directed. °¨ Many foods, especially foods high in vitamin K, can interfere with warfarin and affect the PT and INR results. Foods high in vitamin K include spinach, kale, broccoli, cabbage, collard and turnip greens, brussels sprouts, peas, cauliflower, seaweed, and parsley, as well as beef and pork liver, green tea, and soybean oil. You should eat a consistent amount of foods high in vitamin K. Avoid major changes in your diet, or notify your health care provider before changing your diet. Arrange a visit with a dietitian to answer your questions. °¨ Many medicines can interfere with warfarin and affect the PT and INR results. You must tell your health care provider about any and all medicines you take. This  includes all vitamins and supplements. Be especially cautious with aspirin and anti-inflammatory medicines. Do not take or discontinue any prescribed or over-the-counter medicine except on the advice of your health care provider or pharmacist. °¨ Warfarin can have side effects, such as excessive bruising or bleeding. You will need to hold pressure over cuts for longer than usual. Your health care provider or pharmacist will discuss other potential side effects. °¨ Avoid sports or activities that may cause injury or bleeding. °¨ Be mindful when shaving, flossing your teeth, or handling sharp objects. °¨ Alcohol can change the body's ability to handle warfarin. It is best to avoid alcoholic drinks or consume only very small amounts while taking warfarin. Notify your health care provider if you change your alcohol intake. °¨ Notify your dentist or other health care providers before procedures. °· If swallow studies have determined that your swallowing reflex is present, you should eat healthy foods. Including 5 or more servings of fruits and vegetables a day may reduce the risk of stroke. Foods may need to be a certain consistency (soft or pureed), or small bites may need to be taken in order to avoid aspirating or choking. Certain dietary changes may be advised to address high blood pressure, high cholesterol, diabetes, or obesity. °¨ Food choices that are low in sodium, saturated fat, trans fat, and cholesterol are recommended to manage high blood pressure. °¨   Food choies that are high in fiber, and low in saturated fat, trans fat, and cholesterol may control cholesterol levels. °¨ Controlling carbohydrates and sugar intake is recommended to manage diabetes. °¨ Reducing calorie intake and making food choices that are low in sodium, saturated fat, trans fat, and cholesterol are recommended to manage obesity. °· Maintain a healthy weight. °· Stay physically active. It is recommended that you get at least 30 minutes of  activity on all or most days. °· Do not use any tobacco products including cigarettes, chewing tobacco, or electronic cigarettes. °· Limit alcohol use even if you are not taking warfarin. Moderate alcohol use is considered to be: °¨ No more than 2 drinks each day for men. °¨ No more than 1 drink each day for nonpregnant women. °· Home safety. A safe home environment is important to reduce the risk of falls. Your health care provider may arrange for specialists to evaluate your home. Having grab bars in the bedroom and bathroom is often important. Your health care provider may arrange for equipment to be used at home, such as raised toilets and a seat for the shower. °· Physical, occupational, and speech therapy. Ongoing therapy may be needed to maximize your recovery after a stroke. If you have been advised to use a walker or a cane, use it at all times. Be sure to keep your therapy appointments. °· Follow all instructions for follow-up with your health care provider. This is very important. This includes any referrals, physical therapy, rehabilitation, and lab tests. Proper follow-up can prevent another stroke from occurring. °SEEK MEDICAL CARE IF: °· You have personality changes. °· You have difficulty swallowing. °· You are seeing double. °· You have dizziness. °· You have a fever. °· You have skin breakdown. °SEEK IMMEDIATE MEDICAL CARE IF:  °Any of these symptoms may represent a serious problem that is an emergency. Do not wait to see if the symptoms will go away. Get medical help right away. Call your local emergency services (911 in [place] Do not drive yourself to the hospital. °· You have sudden weakness or numbness of the face, arm, or leg, especially on one side of the body. °· You have sudden trouble walking or difficulty moving arms or legs. °· You have sudden confusion. °· You have trouble speaking (aphasia) or understanding. °· You have sudden trouble seeing in one or both eyes. °· You have a loss of  balance or coordination. °· You have a sudden, severe headache with no known cause. °· You have new chest pain or an irregular heartbeat. °· You have a partial or total loss of consciousness. °Document Released: 08/14/2005 Document Revised: 12/29/2013 Document Reviewed: 03/24/2012 °[employer]® Patient Information ©2015 [employer]. This information is not intended to replace advice given to you by your health care provider. Make sure you discuss any questions you have with your health care provider. ° °

## 2014-09-11 NOTE — Progress Notes (Signed)
Meredith Staggers, MD Physician Signed Physical Medicine and Rehabilitation Consult Note 09/10/2014 6:04 AM  Related encounter: ED to Hosp-Admission (Discharged) from 09/08/2014 in [place] Collapse All        Physical Medicine and Rehabilitation Consult Reason for Consult: Right PLIC infarct secondary to small vessel disease Referring Physician: [facility]   HPI: Diamond Rice is a 44 y.o. right handed female with history of hypertension, diabetes mellitus and peripheral neuropathy, polysubstance abuse and medical noncompliance due to financial hardship. Patient recently homeless and has been living with friends recently. Presented 09/08/2014 with left-sided weakness and slurred speech. Patient with recent admission 08/28/2014 for chest pain and elevated blood pressure with negative troponins urine drug screen was positive at that time for marijuana workup unremarkable. MRI of the brain showed small acute infarct posterior limb of the right internal capsule and corona radiata. MRA without major occlusion or stenosis. Recent echocardiogram with ejection fraction of 65% left ventricular diastolic dysfunction. Carotid Dopplers with no ICA stenosis. Patient did not receive TPA. Neurology services consulted maintained on aspirin for CVA prophylaxis as well as subcutaneous heparin for DVT prophylaxis. Close monitoring of blood pressure. Patient is tolerating a mechanical soft diet. Physical therapy evaluation completed 09/09/2014 with recommendations of physical medicine rehabilitation consult.   Review of Systems  Respiratory: Positive for shortness of breath.  Cardiovascular: Positive for chest pain.  Neurological: Positive for headaches.  Psychiatric/Behavioral:   Anxiety  All other systems reviewed and are negative.  Past Medical History  Diagnosis Date  . Hypertension   . Type II diabetes mellitus   . Anginal pain     . High cholesterol   . Shortness of breath 04/17/2012    "all the time"  . Headache(784.0) 04/17/2012    "~ qod; lately waking up in am w/one"  . Migraines 04/17/2012  . Chronic lower back pain 04/17/2012    "just got over some; catched when I walked"  . Depression   . Anxiety   . Sleep apnea   . Obesity    Past Surgical History  Procedure Laterality Date  . Cardiac catheterization  ~ 2011   Family History  Problem Relation Age of Onset  . Stroke Brother 68  . Stroke Brother 37  . Heart attack Mother 26   Social History:  reports that she quit smoking about 13 months ago. Her smoking use included Cigarettes. She has a .8 pack-year smoking history. She has never used smokeless tobacco. She reports that she drinks alcohol. She reports that she does not use illicit drugs. Allergies:  Allergies  Allergen Reactions  . Morphine And Related Hives   Medications Prior to Admission  Medication Sig Dispense Refill  . ibuprofen (ADVIL,MOTRIN) 200 MG tablet Take 800 mg by mouth 2 (two) times daily as needed (pain).    Marland Kitchen acetaminophen (TYLENOL) 325 MG tablet Take 650 mg by mouth every 6 (six) hours as needed for headache.    Marland Kitchen amLODipine (NORVASC) 10 MG tablet TAKE 1 TABLET BY MOUTH DAILY FOR HYPERTENSION (Patient not taking: Reported on 09/08/2014) 30 tablet 0  . aspirin EC 81 MG EC tablet Take 1 tablet (81 mg total) by mouth daily. (Patient not taking: Reported on 09/08/2014) 30 tablet 0  . glipiZIDE (GLUCOTROL) 10 MG tablet Take 1 tablet (10 mg total) by mouth 2 (two) times daily before a meal. (Patient not taking: Reported on 09/08/2014) 60 tablet 5  . lisinopril-hydrochlorothiazide (ZESTORETIC)  20-12.5 MG per tablet Take 1 tablet by mouth daily. (Patient not taking: Reported on 09/08/2014) 30 tablet 0  . lovastatin (MEVACOR) 40 MG tablet Take 1 tablet (40 mg total) by mouth at bedtime.  (Patient not taking: Reported on 09/08/2014) 30 tablet 0  . metFORMIN (GLUCOPHAGE) 1000 MG tablet Take 1 tablet (1,000 mg total) by mouth 2 (two) times daily with a meal. (Patient not taking: Reported on 09/08/2014) 60 tablet 0    Home: Home Living Family/patient expects to be discharged to:: Private residence Living Arrangements: Non-relatives/Friends Available Help at Discharge: Friend(s) Type of Home: House Home Equipment: None Additional Comments: pt was recently homeless, has recently begun living with some friends, unsure how much support she has or if returning to this home is a good option for her. Will need CSW consult. Lives With: Friend(s) (pt says she has been staying with friends)  Functional History: Prior Function Level of Independence: Independent Comments: pt works part time as a Quarry manager at a [employer] Functional Status:  Mobility: Bed Mobility Overal bed mobility: Modified Independent General bed mobility comments: pt able to get to EOB safely and independently Transfers Overall transfer level: Needs assistance Equipment used: Rolling walker (2 wheeled), Straight cane Transfers: Sit to/from Stand Sit to Stand: Min guard General transfer comment: no LOB with transfers but pt unsteady to left, min-guard for safety Ambulation/Gait Ambulation/Gait assistance: Min assist Ambulation Distance (Feet): 70 Feet Assistive device: Rolling walker (2 wheeled), Straight cane Gait Pattern/deviations: Step-through pattern, Decreased stance time - left, Decreased step length - left, Decreased weight shift to left Gait velocity: decreased Gait velocity interpretation: Below normal speed for age/gender General Gait Details: pt began ambulation with RW but unable to hold consistently with left hand. Switched to [facility] right side and pt had an easier time with this with no significant compromise to balance. With no AD, pt unsteady to left.    ADL: ADL Overall ADL's : Needs  assistance/impaired Eating/Feeding: Set up, Supervision/ safety, Sitting Grooming: Wash/dry face, Wash/dry hands, Oral care, Applying deodorant, Min guard, Standing Upper Body Bathing: Min guard, Standing Upper Body Bathing Details (indicate cue type and reason): cues for technique for RUE Lower Body Bathing: Sit to/from stand, Moderate assistance Upper Body Dressing : Moderate assistance, Sitting Lower Body Dressing: Moderate assistance, Sit to/from stand Toilet Transfer: Min guard, Ambulation, [facility] Toilet Transfer Details (indicate cue type and reason): holding onto things in room when ambulating Toileting- Clothing Manipulation and Hygiene: Moderate assistance, Sit to/from stand Toileting - Clothing Manipulation Details (indicate cue type and reason): assist with gown and LOB when standing at end. Functional mobility during ADLs: Min guard General ADL Comments: Encouraged pt to be using left hand for activities. Educated on dressing technique. Pt ambulated to bathroom and peformed ADLs. Assist with donning socks sitting on bed.  Cognition: Cognition Overall Cognitive Status: No family/caregiver present to determine baseline cognitive functioning Arousal/Alertness: Awake/alert Orientation Level: Oriented X4 Attention: Sustained Sustained Attention: Appears intact Memory: Impaired Memory Impairment: Other (comment) (working memory) Awareness: Appears intact Problem Solving: Impaired Problem Solving Impairment: Verbal complex Safety/Judgment: Appears intact Cognition Arousal/Alertness: Lethargic Behavior During Therapy: WFL for tasks assessed/performed Overall Cognitive Status: No family/caregiver present to determine baseline cognitive functioning Area of Impairment: Problem solving Problem Solving: Slow processing General Comments: pt with overall dampened responses  Blood pressure 178/95, pulse 87, temperature 98.4 F (36.9 C), temperature source Oral, resp. rate 20, height 5'  1" (1.549 m), weight 102.785 kg (226 lb 9.6 oz), last menstrual period  08/28/2014, SpO2 97 %. Physical Exam  Vitals reviewed. Constitutional:  44 year old right-handed obese African-American female  HENT:  Head: Normocephalic.  Eyes: EOM are normal.  Neck: Normal range of motion. Neck supple. No tracheal deviation present. No thyromegaly present.  Cardiovascular: Normal rate and regular rhythm.  Respiratory: Effort normal and breath sounds normal. No respiratory distress.  GI: Soft. Bowel sounds are normal. She exhibits no distension.  Neurological: She is alert.  Mood is flat but appropriate. She participates with exam. Mild slurred speech but fully intelligible. She could provide her name, age and date of birth. Followed simple commands. MMT-- left: 1/5 deltoid, 1/5 bicep, 1/5 tricep, 1/5 wrist extension, 1/5 hand intrinsics. 1+/5 hip flexor, 1+/5 knee extension, 1+/5 ankle dorsiflexion, 1+/5 ankle plantarflexion. Right: 3/5 deltoid, 4/5 bicep, 4/5 tricep, 4/5 wrist extension, 4/5 hand intrinsics. 3/5 hip flexor, 3/5 knee extension, 4/5 ankle dorsiflexion, 4/5 ankle plantarflexion. sEnsation 1/2 RUE and RLE as well as face. Right central 7 and tongue deviation. Speech very dysarthric. Delayed initiation.  Skin: Skin is warm and dry.     Lab Results Last 24 Hours    Results for orders placed or performed during the hospital encounter of 09/08/14 (from the past 24 hour(s))  Glucose, capillary Status: Abnormal   Collection Time: 09/09/14 6:56 AM  Result Value Ref Range   Glucose-Capillary 211 (H) 70 - 99 mg/dL  Glucose, capillary Status: Abnormal   Collection Time: 09/09/14 11:41 AM  Result Value Ref Range   Glucose-Capillary 191 (H) 70 - 99 mg/dL   Comment 1 Notify RN   Glucose, capillary Status: Abnormal   Collection Time: 09/09/14 4:38 PM  Result Value Ref Range   Glucose-Capillary 214 (H) 70 - 99 mg/dL   Comment 1  Notify RN   Glucose, capillary Status: Abnormal   Collection Time: 09/09/14 9:18 PM  Result Value Ref Range   Glucose-Capillary 227 (H) 70 - 99 mg/dL  Basic metabolic panel Status: Abnormal   Collection Time: 09/10/14 3:41 AM  Result Value Ref Range   Sodium 139 135 - 145 mmol/L   Potassium 3.7 3.5 - 5.1 mmol/L   Chloride 102 96 - 112 mEq/L   CO2 26 19 - 32 mmol/L   Glucose, Bld 175 (H) 70 - 99 mg/dL   BUN 9 6 - 23 mg/dL   Creatinine, Ser 0.96 0.50 - 1.10 mg/dL   Calcium 9.2 8.4 - 10.5 mg/dL   GFR calc non Af Amer 72 (L) >90 mL/min   GFR calc Af Amer 83 (L) >90 mL/min   Anion gap 11 5 - 15      Imaging Results (Last 48 hours)    Dg Chest 2 View  09/08/2014 CLINICAL DATA: Acute onset of shortness of breath and dyspnea. Initial encounter. EXAM: CHEST 2 VIEW COMPARISON: Chest radiograph performed 08/28/2014 FINDINGS: The lungs are well-aerated. Mild left midlung atelectasis or scarring is noted. Mild vascular congestion is seen. There is no evidence of focal opacification, pleural effusion or pneumothorax. The heart is borderline enlarged. No acute osseous abnormalities are seen. IMPRESSION: Mild vascular congestion and borderline cardiomegaly. Mild left mid lung atelectasis noted. Electronically Signed By: Garald Balding M.D. On: 09/08/2014 22:49   Ct Head Wo Contrast  09/08/2014 CLINICAL DATA: Left-sided weakness. Facial droop. EXAM: CT HEAD WITHOUT CONTRAST TECHNIQUE: Contiguous axial images were obtained from the base of the skull through the vertex without intravenous contrast. COMPARISON: 12/23/2013 FINDINGS: Mild scattered microvascular changes in the deep white matter, best seen on the right.  No acute intracranial abnormality. Specifically, no hemorrhage, hydrocephalus, mass lesion, acute infarction, or significant intracranial injury. No acute calvarial abnormality. Visualized paranasal  sinuses and mastoids clear. Orbital soft tissues unremarkable. IMPRESSION: No acute intracranial abnormality. Mild chronic microvascular disease. This is advanced for patient's age. Electronically Signed By: Rolm Baptise M.D. On: 09/08/2014 10:49   Mr Brain Wo Contrast  09/08/2014 CLINICAL DATA: Left-sided numbness beginning yesterday. Left facial droop, left grip weakness, and dysarthria today. EXAM: MRI HEAD WITHOUT CONTRAST MRA HEAD WITHOUT CONTRAST TECHNIQUE: Multiplanar, multiecho pulse sequences of the brain and surrounding structures were obtained without intravenous contrast. Angiographic images of the head were obtained using MRA technique without contrast. COMPARISON: Head CT 09/08/2014 FINDINGS: MRI HEAD FINDINGS There is a small, acute infarct measuring approximately 1.5 cm in the right corona radiata and posterior limb of the right internal capsule. Small focus of susceptibility artifact at the posterior aspect of the right putamen likely represents a small amount of chronic microhemorrhage. Chronic lacunar infarcts are present in the basal ganglia bilaterally and left thalamus. Patchy and confluent T2 hyperintensities throughout subcortical and deep cerebral white matter are nonspecific but compatible with moderate chronic small vessel ischemic disease, advanced for age. There is no evidence of mass, midline shift, or extra-axial fluid collection. Ventricles and sulci are within normal limits for age. Orbits are unremarkable. Paranasal sinuses and mastoid air cells are clear. Major intracranial vascular flow voids are preserved. MRA HEAD FINDINGS Images are moderately degraded by motion artifact. The visualized distal vertebral arteries are patent but diffusely small in caliber, likely on a congenital basis. The right vertebral artery supplies the basilar and is irregular distally, which may be artifactual due to motion or reflect atherosclerosis. The intracranial left vertebral  artery is not well seen, likely due to its small size and motion artifact, and it may end in PICA or be occluded intracranially. The right PICA origin is patent. SCA origins are patent. Basilar artery is patent although small in caliber diffusely and irregular. There may be a mild focal stenosis in the mid basilar artery, although evaluation is limited by its small size and motion artifact. There are fetal type origins of both PCAs. PCAs are patent and diffusely irregular. No high-grade proximal PCA stenosis is seen, although there may be mild proximal P2 stenoses bilaterally. There is suggestion of a severe distal left P2 stenosis, although the degree of narrowing may be exaggerated by artifact. The visualized distal cervical ICA on the right is mildly tortuous. Intracranial ICAs are patent from skullbase to carotid termini. There is mild carotid siphon irregularity without evidence of significant stenosis. M1 segments are patent without significant stenosis. There is mild to moderate MCA branch vessel irregularity bilaterally. ACAs are patent but moderately irregular diffusely with a likely mild mid left A1 stenosis. No intracranial aneurysm is identified. IMPRESSION: 1. Small, acute infarct in the posterior limb of the right internal capsule and corona radiata. 2. Moderate chronic small vessel ischemic disease, advanced for age. Chronic lacunar infarcts in the deep gray nuclei. 3. Moderately motion degraded head MRA without evidence of major vessel occlusion or high-grade proximal stenosis in the anterior circulation. Diffuse vessel irregularity likely reflects a combination atherosclerosis and motion artifact. 4. Diffusely small caliber of the distal vertebral arteries and basilar artery on a congenital basis. Intracranial left vertebral artery is not well seen and may be occluded or end in PICA. Electronically Signed By: Logan Bores On: 09/08/2014 17:56   Mr Jodene Nam Head/brain Wo Cm  09/08/2014  CLINICAL DATA: Left-sided numbness beginning yesterday. Left facial droop, left grip weakness, and dysarthria today. EXAM: MRI HEAD WITHOUT CONTRAST MRA HEAD WITHOUT CONTRAST TECHNIQUE: Multiplanar, multiecho pulse sequences of the brain and surrounding structures were obtained without intravenous contrast. Angiographic images of the head were obtained using MRA technique without contrast. COMPARISON: Head CT 09/08/2014 FINDINGS: MRI HEAD FINDINGS There is a small, acute infarct measuring approximately 1.5 cm in the right corona radiata and posterior limb of the right internal capsule. Small focus of susceptibility artifact at the posterior aspect of the right putamen likely represents a small amount of chronic microhemorrhage. Chronic lacunar infarcts are present in the basal ganglia bilaterally and left thalamus. Patchy and confluent T2 hyperintensities throughout subcortical and deep cerebral white matter are nonspecific but compatible with moderate chronic small vessel ischemic disease, advanced for age. There is no evidence of mass, midline shift, or extra-axial fluid collection. Ventricles and sulci are within normal limits for age. Orbits are unremarkable. Paranasal sinuses and mastoid air cells are clear. Major intracranial vascular flow voids are preserved. MRA HEAD FINDINGS Images are moderately degraded by motion artifact. The visualized distal vertebral arteries are patent but diffusely small in caliber, likely on a congenital basis. The right vertebral artery supplies the basilar and is irregular distally, which may be artifactual due to motion or reflect atherosclerosis. The intracranial left vertebral artery is not well seen, likely due to its small size and motion artifact, and it may end in PICA or be occluded intracranially. The right PICA origin is patent. SCA origins are patent. Basilar artery is patent although small in caliber diffusely and irregular. There may be a mild focal  stenosis in the mid basilar artery, although evaluation is limited by its small size and motion artifact. There are fetal type origins of both PCAs. PCAs are patent and diffusely irregular. No high-grade proximal PCA stenosis is seen, although there may be mild proximal P2 stenoses bilaterally. There is suggestion of a severe distal left P2 stenosis, although the degree of narrowing may be exaggerated by artifact. The visualized distal cervical ICA on the right is mildly tortuous. Intracranial ICAs are patent from skullbase to carotid termini. There is mild carotid siphon irregularity without evidence of significant stenosis. M1 segments are patent without significant stenosis. There is mild to moderate MCA branch vessel irregularity bilaterally. ACAs are patent but moderately irregular diffusely with a likely mild mid left A1 stenosis. No intracranial aneurysm is identified. IMPRESSION: 1. Small, acute infarct in the posterior limb of the right internal capsule and corona radiata. 2. Moderate chronic small vessel ischemic disease, advanced for age. Chronic lacunar infarcts in the deep gray nuclei. 3. Moderately motion degraded head MRA without evidence of major vessel occlusion or high-grade proximal stenosis in the anterior circulation. Diffuse vessel irregularity likely reflects a combination atherosclerosis and motion artifact. 4. Diffusely small caliber of the distal vertebral arteries and basilar artery on a congenital basis. Intracranial left vertebral artery is not well seen and may be occluded or end in PICA. Electronically Signed By: Logan Bores On: 09/08/2014 17:56     Assessment/Plan: Diagnosis: Right PLIC/CR infarct 1. Does the need for close, 24 hr/day medical supervision in concert with the patient's rehab needs make it unreasonable for this patient to be served in a less intensive setting? Yes 2. Co-Morbidities requiring supervision/potential complications: dm, obesity 3. Due to  bladder management, bowel management, safety, skin/wound care, disease management, medication administration, pain management and patient education, does the patient require 24  hr/day rehab nursing? Yes 4. Does the patient require coordinated care of a physician, rehab nurse, PT (1-2 hrs/day, 5 days/week), OT (1-2 hrs/day, 5 days/week) and SLP (1-2 hrs/day, 5 days/week) to address physical and functional deficits in the context of the above medical diagnosis(es)? Yes Addressing deficits in the following areas: balance, endurance, locomotion, strength, transferring, bowel/bladder control, bathing, dressing, feeding, grooming, toileting, cognition, speech, language, swallowing and psychosocial support 5. Can the patient actively participate in an intensive therapy program of at least 3 hrs of therapy per day at least 5 days per week? Yes 6. The potential for patient to make measurable gains while on inpatient rehab is excellent 7. Anticipated functional outcomes upon discharge from inpatient rehab are modified independent and supervision with PT, modified independent and supervision with OT, modified independent and supervision with SLP. 8. Estimated rehab length of stay to reach the above functional goals is: 17-21 days 9. Does the patient have adequate social supports and living environment to accommodate these discharge functional goals? Yes and Potentially 10. Anticipated D/C setting: Home 11. Anticipated post D/C treatments: HH therapy and Outpatient therapy 12. Overall Rehab/Functional Prognosis: excellent  RECOMMENDATIONS: This patient's condition is appropriate for continued rehabilitative care in the following setting: CIR Patient has agreed to participate in recommended program. Yes Note that insurance prior authorization may be required for reimbursement for recommended care.  Comment: Rehab Admissions Coordinator to follow up.  Thanks,  Meredith Staggers, MD,  [facility]     09/10/2014       Revision History     Date/Time User Provider Type Action   09/10/2014 10:55 AM Meredith Staggers, MD Physician Sign   09/10/2014 6:38 AM Cathlyn Parsons, PA-C Physician Assistant Pend   View Details Report       Routing History

## 2014-09-11 NOTE — Progress Notes (Signed)
Patient is discharged from room 4N22 at this time and transferred to unit 4M06. Alert and in stable condition. Tele d/c'd per protocol. Report given to nurse Sharyn Lull, RN. Transported off unit via bed with all belongings at side.

## 2014-09-11 NOTE — H&P (Signed)
Physical Medicine and Rehabilitation Admission H&P   Chief Complaint  Patient presents with  . Extremity Weakness  . Facial Droop  : HPI: Diamond Rice is a 44 y.o. right handed female with history of hypertension, diabetes mellitus and peripheral neuropathy, polysubstance abuse and medical noncompliance due to financial hardship. Patient recently homeless and has been living with friends recently. Presented 09/08/2014 with left-sided weakness and slurred speech. Patient with recent admission 08/28/2014 for chest pain and elevated blood pressure with negative troponins urine drug screen was positive at that time for marijuana workup unremarkable. MRI of the brain showed small acute infarct posterior limb of the right internal capsule and corona radiata. MRA without major occlusion or stenosis. Recent echocardiogram with ejection fraction of 65% left ventricular diastolic dysfunction. Carotid Dopplers with no ICA stenosis. Patient did not receive TPA. Neurology services consulted maintained on aspirin for CVA prophylaxis as well as subcutaneous heparin for DVT prophylaxis. Close monitoring of blood pressure. Patient is tolerating a mechanical soft diet. Physical therapy evaluation completed 09/09/2014 with recommendations of physical medicine rehabilitation consult. Patient was admitted for a comprehensive rehabilitation program  ROS Review of Systems  Respiratory: Positive for shortness of breath.  Cardiovascular: Positive for chest pain.  Neurological: Positive for headaches.  Psychiatric/Behavioral:   Anxiety  All other systems reviewed and are negative   Past Medical History  Diagnosis Date  . Hypertension   . Type II diabetes mellitus   . Anginal pain   . High cholesterol   . Shortness of breath 04/17/2012    "all the time"  . Headache(784.0) 04/17/2012    "~ qod; lately waking up in am w/one"  . Migraines 04/17/2012  .  Chronic lower back pain 04/17/2012    "just got over some; catched when I walked"  . Depression   . Anxiety   . Sleep apnea   . Obesity    Past Surgical History  Procedure Laterality Date  . Cardiac catheterization  ~ 2011   Family History  Problem Relation Age of Onset  . Stroke Brother 22  . Stroke Brother 33  . Heart attack Mother 13   Social History:  reports that she quit smoking about 13 months ago. Her smoking use included Cigarettes. She has a .8 pack-year smoking history. She has never used smokeless tobacco. She reports that she drinks alcohol. She reports that she does not use illicit drugs. Allergies:  Allergies  Allergen Reactions  . Morphine And Related Hives   Medications Prior to Admission  Medication Sig Dispense Refill  . ibuprofen (ADVIL,MOTRIN) 200 MG tablet Take 800 mg by mouth 2 (two) times daily as needed (pain).    Marland Kitchen acetaminophen (TYLENOL) 325 MG tablet Take 650 mg by mouth every 6 (six) hours as needed for headache.    Marland Kitchen amLODipine (NORVASC) 10 MG tablet TAKE 1 TABLET BY MOUTH DAILY FOR HYPERTENSION (Patient not taking: Reported on 09/08/2014) 30 tablet 0  . aspirin EC 81 MG EC tablet Take 1 tablet (81 mg total) by mouth daily. (Patient not taking: Reported on 09/08/2014) 30 tablet 0  . glipiZIDE (GLUCOTROL) 10 MG tablet Take 1 tablet (10 mg total) by mouth 2 (two) times daily before a meal. (Patient not taking: Reported on 09/08/2014) 60 tablet 5  . lisinopril-hydrochlorothiazide (ZESTORETIC) 20-12.5 MG per tablet Take 1 tablet by mouth daily. (Patient not taking: Reported on 09/08/2014) 30 tablet 0  . lovastatin (MEVACOR) 40 MG tablet Take 1 tablet (40 mg total) by  mouth at bedtime. (Patient not taking: Reported on 09/08/2014) 30 tablet 0  . metFORMIN (GLUCOPHAGE) 1000 MG tablet Take 1 tablet (1,000 mg total) by mouth 2 (two) times daily with a meal. (Patient not taking:  Reported on 09/08/2014) 60 tablet 0    Home: Home Living Family/patient expects to be discharged to:: Private residence Living Arrangements: Non-relatives/Friends Available Help at Discharge: Friend(s) Type of Home: House Home Equipment: None Additional Comments: pt was recently homeless, has recently begun living with some friends, unsure how much support she has or if returning to this home is a good option for her. Will need CSW consult. 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  Transfers Overall transfer level: Needs assistance Equipment used: None Transfers: Sit to/from Stand Sit to Stand: Min guard General transfer comment: Had pt perform all transfers without AD to further challenge balance and L quad/glute strength.  Ambulation/Gait Ambulation/Gait assistance: Min assist Ambulation Distance (Feet): 100 Feet Assistive device: 1 person hand held assist Gait Pattern/deviations: Step-through pattern, Decreased stride length, Decreased step length - left, Decreased stance time - left, Decreased weight shift to left, Trendelenburg, Trunk flexed Gait velocity: decreased Gait velocity interpretation: Below normal speed for age/gender General Gait Details: Pt able to tolerate ambulation without AD very well with min cues and facilitation for increased L weight shift, L WB and upright posture.     ADL: ADL Overall ADL's : Needs assistance/impaired Eating/Feeding: Set up, Supervision/ safety, Sitting Grooming: Wash/dry face, Wash/dry hands, Oral care, Applying deodorant, Min guard, Standing Upper Body Bathing: Min guard, Standing Upper Body Bathing Details (indicate cue type and  reason): cues for technique for RUE Lower Body Bathing: Sit to/from stand, Moderate assistance Upper Body Dressing : Moderate assistance, Sitting Lower Body Dressing: Moderate assistance, Sit to/from stand Toilet Transfer: Min guard, Ambulation, [facility] Toilet Transfer Details (indicate cue type and reason): holding onto things in room when ambulating Toileting- Clothing Manipulation and Hygiene: Moderate assistance, Sit to/from stand Toileting - Clothing Manipulation Details (indicate cue type and reason): assist with gown and LOB when standing at end. Functional mobility during ADLs: Min guard General ADL Comments: Encouraged pt to be using left hand for activities. Educated on dressing technique. Pt ambulated to bathroom and peformed ADLs. Assist with donning socks sitting on bed.  Cognition: Cognition Overall Cognitive Status: No family/caregiver present to determine baseline cognitive functioning Arousal/Alertness: Awake/alert Orientation Level: Oriented X4 Attention: Sustained Sustained Attention: Appears intact Memory: Impaired Memory Impairment: Other (comment) (working memory) Awareness: Appears intact Problem Solving: Impaired Problem Solving Impairment: Verbal complex Safety/Judgment: Appears intact Cognition Arousal/Alertness: Awake/alert Behavior During Therapy: WFL for tasks assessed/performed Overall Cognitive Status: No family/caregiver present to determine baseline cognitive functioning Area of Impairment: Memory, Safety/judgement, Awareness Memory: Decreased recall of precautions Safety/Judgement: Decreased awareness of safety Awareness: Emergent Problem Solving: Slow processing General Comments: pt with overall dampened responses  Physical Exam: Blood pressure 171/96, pulse 86, temperature 99 F (37.2 C), temperature source Oral, resp. rate 18, height $RemoveBe'5\' 1"'WIkpCebcz$  (1.549 m), weight 102.785 kg (226 lb 9.6 oz), last menstrual period 08/28/2014, SpO2 100 %. Physical  Exam Constitutional: no distress 44 year old right-handed obese African-American female  HENT: oral mucosa pink/moist Head: Normocephalic.  Eyes: EOM are normal.  Neck: Normal  range of motion. Neck supple. No tracheal deviation present. No thyromegaly present.  Cardiovascular: Normal rate and regular rhythm.  Respiratory: Effort normal and breath sounds normal. No respiratory distress.  GI: Soft. Bowel sounds are normal. She exhibits no distension.  Neurological: She is alert.  Mood is flat but appropriate. She participates with exam. Mild slurred speech but fully intelligible. She could provide her name, age and date of birth. Followed simple commands. MMT-- left: 1+/5 deltoid, 1+/5 bicep, 1+/5 tricep, 1/5 wrist extension, 1/5 hand intrinsics. 1+/5 hip flexor, 1+ to 2/5 knee extension, 1+ to 2/5 ankle dorsiflexion, 1+ to 2/5 ankle plantarflexion. Right: 3/5 deltoid, 4/5 bicep, 4/5 tricep, 4/5 wrist extension, 4/5 hand intrinsics. 3/5 hip flexor, 3/5 knee extension, 4/5 ankle dorsiflexion, 4/5 ankle plantarflexion. sEnsation 1/2 RUE and RLE as well as face. Right central 7 and tongue deviation. Speech very dysarthric. Delayed initiation.  Skin: Skin is warm and dry  Lab Results Last 48 Hours    Results for orders placed or performed during the hospital encounter of 09/08/14 (from the past 48 hour(s))  Glucose, capillary Status: Abnormal   Collection Time: 09/09/14 6:56 AM  Result Value Ref Range   Glucose-Capillary 211 (H) 70 - 99 mg/dL  Glucose, capillary Status: Abnormal   Collection Time: 09/09/14 11:41 AM  Result Value Ref Range   Glucose-Capillary 191 (H) 70 - 99 mg/dL   Comment 1 Notify RN   Glucose, capillary Status: Abnormal   Collection Time: 09/09/14 4:38 PM  Result Value Ref Range   Glucose-Capillary 214 (H) 70 - 99 mg/dL   Comment 1 Notify RN   Glucose, capillary Status: Abnormal   Collection  Time: 09/09/14 9:18 PM  Result Value Ref Range   Glucose-Capillary 227 (H) 70 - 99 mg/dL  CBC Status: Abnormal   Collection Time: 09/10/14 3:41 AM  Result Value Ref Range   WBC 11.3 (H) 4.0 - 10.5 K/uL   RBC 5.13 (H) 3.87 - 5.11 MIL/uL   Hemoglobin 11.9 (L) 12.0 - 15.0 g/dL   HCT 37.2 36.0 - 46.0 %   MCV 72.5 (L) 78.0 - 100.0 fL   MCH 23.2 (L) 26.0 - 34.0 pg   MCHC 32.0 30.0 - 36.0 g/dL   RDW 15.7 (H) 11.5 - 15.5 %   Platelets 377 150 - 400 K/uL  Basic metabolic panel Status: Abnormal   Collection Time: 09/10/14 3:41 AM  Result Value Ref Range   Sodium 139 135 - 145 mmol/L    Comment: Please note change in reference range.   Potassium 3.7 3.5 - 5.1 mmol/L    Comment: Please note change in reference range.   Chloride 102 96 - 112 mEq/L   CO2 26 19 - 32 mmol/L   Glucose, Bld 175 (H) 70 - 99 mg/dL   BUN 9 6 - 23 mg/dL   Creatinine, Ser 0.96 0.50 - 1.10 mg/dL   Calcium 9.2 8.4 - 10.5 mg/dL   GFR calc non Af Amer 72 (L) >90 mL/min   GFR calc Af Amer 83 (L) >90 mL/min    Comment: (NOTE) The eGFR has been calculated using the CKD EPI equation. This calculation has not been validated in all clinical situations. eGFR's persistently <90 mL/min signify possible Chronic Kidney Disease.    Anion gap 11 5 - 15  Glucose, capillary Status: Abnormal   Collection Time: 09/10/14 6:44 AM  Result Value Ref Range   Glucose-Capillary 155 (H) 70 - 99 mg/dL   Comment 1 Documented in Chart  Comment 2 Notify RN   Glucose, capillary Status: Abnormal   Collection Time: 09/10/14 11:23 AM  Result Value Ref Range   Glucose-Capillary 255 (H) 70 - 99 mg/dL  Glucose, capillary Status: Abnormal   Collection Time: 09/10/14 4:18 PM  Result Value Ref Range   Glucose-Capillary 230 (H) 70 - 99 mg/dL   Comment 1 Notify RN     Comment 2 Documented in Chart   Glucose, capillary Status: Abnormal   Collection Time: 09/10/14 9:12 PM  Result Value Ref Range   Glucose-Capillary 274 (H) 70 - 99 mg/dL   Comment 1 Documented in Chart    Comment 2 Notify RN       Imaging Results (Last 48 hours)    No results found.       Medical Problem List and Plan: 1. Functional deficits secondary to Right PLIC infarct/CR infarct 2. DVT Prophylaxis/Anticoagulation: Subcutaneous heparin. Monitor platelet counts or any signs of bleeding 3. Pain Management: Tylenol as needed 4. Dysphagia. Mechanical soft diet. Follow-up speech therapy 5. Neuropsych: This patient is capable of making decisions on her own behalf. 6. Skin/Wound Care: Routine skin checks 7. Fluids/Electrolytes/Nutrition: Strict I and O follow-up chemistries 8. Hypertension. Norvasc 10 mg daily, hydrochlorothiazide 25 mg daily, lisinopril 20 mg daily. Monitor for increased mobility 9. Diabetes mellitus with peripheral neuropathy. Hemoglobin A1c 10.3. Currently with sliding scale insulin. Patient on Glucotrol 10 mg twice a day and Glucophage 1000 mg twice a day prior to admission but was not taking due to medical compliance and financial constraints. She will need full diabetic teaching 10. Hyperlipidemia. Lipitor 11. Medical noncompliance. Documentation of medical noncompliance and financial constraints prior to admission. Provide counseling and social work follow-up 12. History of polysubstance abuse. Counseling    Post Admission Physician Evaluation: 1. Functional deficits secondary to right posterior limb of internal capsule, corona radiata infarct. 2. Patient is admitted to receive collaborative, interdisciplinary care between the physiatrist, rehab nursing staff, and therapy team. 3. Patient's level of medical complexity and substantial therapy needs in context of that medical necessity cannot be provided at a lesser intensity of care  such as a SNF. 4. Patient has experienced substantial functional loss from his/her baseline which was documented above under the "Functional History" and "Functional Status" headings. Judging by the patient's diagnosis, physical exam, and functional history, the patient has potential for functional progress which will result in measurable gains while on inpatient rehab. These gains will be of substantial and practical use upon discharge in facilitating mobility and self-care at the household level. 5. Physiatrist will provide 24 hour management of medical needs as well as oversight of the therapy plan/treatment and provide guidance as appropriate regarding the interaction of the two. 6. 24 hour rehab nursing will assist with bladder management, bowel management, safety, skin/wound care, disease management, medication administration, pain management and patient education and help integrate therapy concepts, techniques,education, etc. 7. PT will assess and treat for/with: Lower extremity strength, range of motion, stamina, balance, functional mobility, safety, adaptive techniques and equipment, NMR, cognitive perceptual rx. Goals are: mod I. 8. OT will assess and treat for/with: ADL's, functional mobility, safety, upper extremity strength, adaptive techniques and equipment, NMR, visual-perceptual awareness. Goals are: mod I. Therapy may proceed with showering this patient. 9. SLP will assess and treat for/with: cognition, communication, swallowing. Goals are: mod I. 10. Case Management and Social Worker will assess and treat for psychological issues and discharge planning. 11. Team conference will be held weekly to assess progress toward  goals and to determine barriers to discharge. 12. Patient will receive at least 3 hours of therapy per day at least 5 days per week. 13. ELOS: 10-12days 14. Prognosis: excellent     Meredith Staggers, MD, [facility] 09/11/2014

## 2014-09-11 NOTE — Progress Notes (Signed)
Physical Therapy Treatment Patient Details Name: Diamond Rice MRN: [id number] DOB: [birth year] Today's Date: 09/11/2014    History of Present Illness 44 y/o female with PMH T2DM, HTN, HLD, substance abuse, and medication noncompliance due to financial hardship. Recently admitted for accelerated HTN and ACS rule out. She has not been able to pick up any of her prescribed medications. Presents with 1 day history of left sided numbness, facial droop, and slurred speech. TTS:VXBLT, acute infarct in the posterior limb of the right internal capsule and corona radiata and moderate chronic small vessel ischemic disease, advanced for age as well as chronic lacunar infarcts in the deep gray nuclei.    PT Comments    Pt. Will be a good candidate for d/c to CIR to help with gait training and strengthening Left side to help with function. PT. Would benefit from more UE and LE strengthening exercises and gross and fine motor control training for the Left side.   Follow Up Recommendations  CIR     Equipment Recommendations  Other (comment)    Recommendations for Other Services OT consult;Rehab consult     Precautions / Restrictions Precautions Precautions: Fall Precaution Comments: L hemiparesis Restrictions Weight Bearing Restrictions: No    Mobility  Bed Mobility Overal bed mobility: Modified Independent             General bed mobility comments: Pt uses right arm primarily to lift left into position. pushed up with right and could bring trunk upright.  Transfers Overall transfer level: Needs assistance Equipment used: Rolling walker (2 wheeled) Transfers: Sit to/from Stand Sit to Stand: Min guard         General transfer comment: Had pt perform 10 sit to stand transfers serially to challenge L quad/ strength.  Ambulation/Gait Ambulation/Gait assistance: Min guard Ambulation Distance (Feet): 100 Feet Assistive device: Rolling walker (2 wheeled) Gait Pattern/deviations:  Step-through pattern;Decreased stride length;Decreased step length - left;Decreased stance time - left;Decreased weight shift to left;Trendelenburg;Trunk flexed Gait velocity: decreased Gait velocity interpretation: Below normal speed for age/gender General Gait Details: Pt able to tolerate ambulation well with AD  saw foot drop on left   Stairs            Wheelchair Mobility    Modified Rankin (Stroke Patients Only) Modified Rankin (Stroke Patients Only) Pre-Morbid Rankin Score: No symptoms Modified Rankin: Moderately severe disability     Balance                                    Cognition Arousal/Alertness: Awake/alert Behavior During Therapy: WFL for tasks assessed/performed                        Exercises General Exercises - Upper Extremity Shoulder ABduction: Seated;Left;10 reps;AROM Elbow Flexion: AROM;Seated;Left;10 reps Elbow Extension: AROM;Left;10 reps;Seated General Exercises - Lower Extremity Long Arc Quad: AROM;Left;10 reps;Seated Mini-Sqauts: AROM;Both;10 reps;Standing Other Exercises Other Exercises: PNF D1 and D2 UE x5 Other Exercises: Pt. standing modified plantigrade at counter with rt. hand over left and moves washcloth in a circular and forward and backward motion x 1' Other Exercises: Pt standing modified plantigrade and WS L and R x 5     General Comments        Pertinent Vitals/Pain Pain Assessment: No/denies pain    Home Living  Prior Function            PT Goals (current goals can now be found in the care plan section) Progress towards PT goals: Progressing toward goals    Frequency  Min 4X/week    PT Plan Current plan remains appropriate    Co-evaluation             End of Session Equipment Utilized During Treatment: Gait belt Activity Tolerance: Patient tolerated treatment well Patient left: in bed;with bed alarm set;with call bell/phone within reach      Time: 0950-1040 PT Time Calculation (min) (ACUTE ONLY): 50 min  Charges:  $Gait Training: 8-22 mins $Therapeutic Exercise: 8-22 mins $Therapeutic Activity: 8-22 mins                    G Codes:      Jodi Geralds, [place] 09/11/2014, 12:14 PM

## 2014-09-11 NOTE — Progress Notes (Signed)
[name], RN Rehab Admission Coordinator Signed Physical Medicine and Rehabilitation PMR Pre-admission 09/11/2014 10:03 AM  Related encounter: ED to Hosp-Admission (Discharged) from 09/08/2014 in [place] [facility]   PMR Admission Coordinator Pre-Admission Assessment  Patient: Diamond Rice is an 44 y.o., female MRN: [id number] DOB: [birth year] Height: 5\' 1"  (154.9 cm) Weight: 102.785 kg (226 lb 9.6 oz)  Insurance Information Self pay - No insurance  Medicaid Application Date: Case Manager:  Disability Application Date: Case Worker:   Emergency Facilities manager Information    Name Relation Home Work Mobile   [place] Brother   [phone]   Tonye Royalty   [phone]     Current Medical History  Patient Admitting Diagnosis: R PLIC/CR infarct  History of Present Illness: A 44 y.o. right handed female with history of hypertension, diabetes mellitus and peripheral neuropathy, polysubstance abuse and medical noncompliance due to financial hardship. Patient recently homeless and has been living with friends recently. Presented 09/08/2014 with left-sided weakness and slurred speech. Patient with recent admission 08/28/2014 for chest pain and elevated blood pressure with negative troponins urine drug screen was positive at that time for marijuana workup unremarkable. MRI of the brain showed small acute infarct posterior limb of the right internal capsule and corona radiata. MRA without major occlusion or stenosis. Recent echocardiogram with ejection fraction of 65% left ventricular diastolic dysfunction. Carotid Dopplers with no ICA stenosis. Patient did not receive TPA. Neurology services consulted maintained on aspirin for CVA  prophylaxis as well as subcutaneous heparin for DVT prophylaxis. Close monitoring of blood pressure. Patient is tolerating a mechanical soft diet. Physical therapy evaluation completed 09/09/2014 with recommendations of physical medicine rehabilitation consult. Patient to be admitted for a comprehensive inpatient rehabilitation program.   Total: 4=NIH  Past Medical History  Past Medical History  Diagnosis Date  . Hypertension   . Type II diabetes mellitus   . Anginal pain   . High cholesterol   . Shortness of breath 04/17/2012    "all the time"  . Headache(784.0) 04/17/2012    "~ qod; lately waking up in am w/one"  . Migraines 04/17/2012  . Chronic lower back pain 04/17/2012    "just got over some; catched when I walked"  . Depression   . Anxiety   . Sleep apnea   . Obesity     Family History  family history includes Heart attack (age of onset: 24) in her mother; Stroke (age of onset: 41) in her brother and brother.  Prior Rehab/Hospitalizations: No previous rehab admissions.  Current Medications   Current facility-administered medications:  . 0.9 % sodium chloride infusion, , Intravenous, Continuous, Frazier Richards, MD, Last Rate: 125 mL/hr at 09/08/14 1807 . amLODipine (NORVASC) tablet 10 mg, 10 mg, Oral, Daily, Leeanne Rio, MD, 10 mg at 09/11/14 1022 . aspirin tablet 325 mg, 325 mg, Oral, Daily, Frazier Richards, MD, 325 mg at 09/11/14 1022 . atorvastatin (LIPITOR) tablet 40 mg, 40 mg, Oral, q1800, Frazier Richards, MD, 40 mg at 09/10/14 1725 . heparin injection 5,000 Units, 5,000 Units, Subcutaneous, 3 times per day, Frazier Richards, MD, 5,000 Units at 09/11/14 0610 . hydrALAZINE (APRESOLINE) injection 10 mg, 10 mg, Intravenous, Q4H PRN, Leeanne Rio, MD, 10 mg at 09/08/14 2015 . hydrochlorothiazide (HYDRODIURIL) tablet 25 mg, 25 mg, Oral, Daily, Leone Brand, MD, 25 mg at 09/11/14 1022 . insulin aspart  (novoLOG) injection 0-15 Units, 0-15  Units, Subcutaneous, TID WC, Frazier Richards, MD, 3 Units at 09/11/14 [phone] . insulin aspart (novoLOG) injection 0-5 Units, 0-5 Units, Subcutaneous, QHS, Frazier Richards, MD, 3 Units at 09/10/14 2157 . labetalol (NORMODYNE,TRANDATE) injection 10 mg, 10 mg, Intravenous, Q4H PRN, Leeanne Rio, MD . lisinopril (PRINIVIL,ZESTRIL) tablet 20 mg, 20 mg, Oral, Daily, Leeanne Rio, MD, 20 mg at 09/11/14 1022 . oxyCODONE-acetaminophen (PERCOCET/ROXICET) 5-325 MG per tablet 1 tablet, 1 tablet, Oral, Q6H PRN, Lavon Paganini, MD, 1 tablet at 09/11/14 0836 . senna-docusate (Senokot-S) tablet 1 tablet, 1 tablet, Oral, QHS PRN, Frazier Richards, MD  Patients Current Diet: DIET DYS 3  Precautions / Restrictions Precautions Precautions: Fall Precaution Comments: L hemiparesis Restrictions Weight Bearing Restrictions: No   Prior Activity Level Limited Community (1-2x/wk): Worked PT 12 hour shifts 2 X a week. Went out about 3 X a week.   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  Current Functional Level Cognition  Arousal/Alertness: Awake/alert Overall Cognitive Status: No family/caregiver present to determine baseline cognitive functioning Orientation Level: Oriented X4 Safety/Judgement: Decreased awareness of safety General Comments: pt with overall dampened responses Attention: Sustained Sustained Attention: Appears intact Memory: Impaired Memory Impairment: Other (comment) (working memory) Awareness: Appears intact Problem Solving: Impaired Problem Solving Impairment: Verbal complex Safety/Judgment: Appears intact   Extremity Assessment (includes Sensation/Coordination)  Upper Extremity Assessment: Defer to OT evaluation;LUE deficits/detail LUE Deficits /  Details: insufficient grip strength to grasp and keep hold of RW Lower Extremity Assessment: LLE deficits/detail LLE Deficits / Details: hip flex 2-/5, knee flex/ ext 3+/5, ankle 3/5 Cervical / Trunk Assessment: Normal   ADLs  Overall ADL's : Needs assistance/impaired Eating/Feeding: Set up, Supervision/ safety, Sitting Grooming: Wash/dry face, Wash/dry hands, Oral care, Applying deodorant, Min guard, Standing Upper Body Bathing: Min guard, Standing Upper Body Bathing Details (indicate cue type and reason): cues for technique for RUE Lower Body Bathing: Sit to/from stand, Moderate assistance Upper Body Dressing : Moderate assistance, Sitting Lower Body Dressing: Moderate assistance, Sit to/from stand Toilet Transfer: Min guard, Ambulation, [facility] Toilet Transfer Details (indicate cue type and reason): holding onto things in room when ambulating Toileting- Clothing Manipulation and Hygiene: Moderate assistance, Sit to/from stand Toileting - Clothing Manipulation Details (indicate cue type and reason): assist with gown and LOB when standing at end. Functional mobility during ADLs: Min guard General ADL Comments: Encouraged pt to be using left hand for activities. Educated on dressing technique. Pt ambulated to bathroom and peformed ADLs. Assist with donning socks sitting on bed.    Mobility  Overal bed mobility: Modified Independent General bed mobility comments: Pt uses right arm primarily to lift left into position. pushed up with right and could bring trunk upright.    Transfers  Overall transfer level: Needs assistance Equipment used: Rolling walker (2 wheeled) Transfers: Sit to/from Stand Sit to Stand: Min guard General transfer comment: Had pt perform 10 sit to stand transfers serially to challenge L quad/ strength.    Ambulation / Gait / Stairs / Wheelchair Mobility  Ambulation/Gait Ambulation/Gait assistance: Physicist, medical (Feet): 100 Feet Assistive  device: Rolling walker (2 wheeled) Gait Pattern/deviations: Step-through pattern, Decreased stride length, Decreased step length - left, Decreased stance time - left, Decreased weight shift to left, Trendelenburg, Trunk flexed Gait velocity: decreased Gait velocity interpretation: Below normal speed for age/gender General Gait  Details: Pt able to tolerate ambulation well with AD saw foot drop on left    Posture / Balance Overall balance assessment: Needs assistance Sitting-balance support: Single extremity supported Sitting balance-Leahy Scale: Good Standing balance support: No upper extremity supported Standing balance-Leahy Scale: Fair Standing balance comment: unable to accept challenges to left   Special needs/care consideration BiPAP/CPAP No CPM No Continuous Drip IV 0.9% NS 125 ml/hr Dialysis No  Life Vest No Oxygen No Special Bed No Trach Size No Wound Vac (area) No  Skin No  Bowel mgmt: Last documented BM 09/09/14, but patient reports BM 09/10/14 Bladder mgmt: BRP with assistance Diabetic mgmt Yes, on oral medications at home, insulin while in the hospital.    Previous Home Environment Living Arrangements: Non-relatives/Friends Lives With: Friend(s) (pt says she has been staying with friends) Available Help at Discharge: Friend(s) Type of Home: [place]: No Additional Comments: pt was recently homeless, has recently begun living with some friends, unsure how much support she has or if returning to this home is a good option for her. Will need CSW consult.  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 It is temporary, but she can return to friend's house. Caregiver Availability: Intermittent Discharge Plan Discussed with Primary Caregiver: Yes Is Caregiver In Agreement with Plan?: Yes Does Caregiver/Family have Issues with Lodging/Transportation while Pt is in Rehab?: No  Goals/Additional Needs Patient/Family Goal for Rehab: PT/OT ST mod I goals Expected length of stay: 17-21 days Cultural Considerations: None Dietary Needs: Dys 3, thin liquids Equipment Needs: TBD Pt/Family Agrees to Admission and willing to participate: Yes Program Orientation Provided & Reviewed with Pt/Caregiver Including Roles & Responsibilities: Yes  Decrease burden of Care through IP rehab admission: N/A  Possible need for SNF placement upon discharge: Not planned  Patient Condition: This patient's condition remains as documented in the consult dated 09/10/14, in which the Rehabilitation Physician determined and documented that the patient's condition is appropriate for intensive rehabilitative care in an inpatient rehabilitation facility. Will admit to inpatient rehab today.  Preadmission Screen Completed By: [name], 09/11/2014 11:33 AM ______________________________________________________________________  Discussed status with Dr. Naaman Plummer on 09/11/14 at 47 and received telephone approval for admission today.  Admission Coordinator: [name], time 1133/Date01/15/16          Cosigned by: Meredith Staggers, MD at 09/11/2014 11:39 AM  Revision History     Date/Time User Provider Type Action   09/11/2014 11:39 AM Meredith Staggers, MD Physician Cosign   09/11/2014 11:33 AM [name], RN Rehab Admission Coordinator Sign

## 2014-09-11 NOTE — PMR Pre-admission (Signed)
PMR Admission Coordinator Pre-Admission Assessment  Patient: Diamond Rice is an 45 y.o., female MRN: [id number] DOB: December 02, 1970 Height: 5\' 1"  (154.9 cm) Weight: 102.785 kg (226 lb 9.6 oz)              Insurance Information Self pay - No insurance  Medicaid Application Date:        Case Manager:   Disability Application Date:        Case Worker:    Emergency Facilities manager Information    Name Relation Home Work Mobile   [place] Brother   [phone]   Tonye Royalty   [phone]     Current Medical History  Patient Admitting Diagnosis:  R PLIC/CR infarct  History of Present Illness: A 44 y.o. right handed female with history of hypertension, diabetes mellitus and peripheral neuropathy, polysubstance abuse and medical noncompliance due to financial hardship. Patient recently homeless and has been living with friends recently. Presented 09/08/2014 with left-sided weakness and slurred speech. Patient with recent admission 08/28/2014 for chest pain and elevated blood pressure with negative troponins urine drug screen was positive at that time for marijuana workup unremarkable. MRI of the brain showed small acute infarct posterior limb of the right internal capsule and corona radiata. MRA without major occlusion or stenosis. Recent echocardiogram with ejection fraction of 65% left ventricular diastolic dysfunction. Carotid Dopplers with no ICA stenosis. Patient did not receive TPA. Neurology services consulted maintained on aspirin for CVA prophylaxis as well as subcutaneous heparin for DVT prophylaxis. Close monitoring of blood pressure. Patient is tolerating a mechanical soft diet. Physical therapy evaluation completed 09/09/2014 with recommendations of physical medicine rehabilitation consult. Patient to be admitted for a comprehensive inpatient rehabilitation program.    Total: 4=NIH  Past Medical History  Past Medical History  Diagnosis Date  . Hypertension    . Type II diabetes mellitus   . Anginal pain   . High cholesterol   . Shortness of breath 04/17/2012    "all the time"  . Headache(784.0) 04/17/2012    "~ qod; lately waking up in am w/one"  . Migraines 04/17/2012  . Chronic lower back pain 04/17/2012    "just got over some; catched when I walked"  . Depression   . Anxiety   . Sleep apnea   . Obesity     Family History  family history includes Heart attack (age of onset: 65) in her mother; Stroke (age of onset: 74) in her brother and brother.  Prior Rehab/Hospitalizations:  No previous rehab admissions.   Current Medications   Current facility-administered medications:  .  0.9 %  sodium chloride infusion, , Intravenous, Continuous, Frazier Richards, MD, Last Rate: 125 mL/hr at 09/08/14 1807 .  amLODipine (NORVASC) tablet 10 mg, 10 mg, Oral, Daily, Leeanne Rio, MD, 10 mg at 09/11/14 1022 .  aspirin tablet 325 mg, 325 mg, Oral, Daily, Frazier Richards, MD, 325 mg at 09/11/14 1022 .  atorvastatin (LIPITOR) tablet 40 mg, 40 mg, Oral, q1800, Frazier Richards, MD, 40 mg at 09/10/14 1725 .  heparin injection 5,000 Units, 5,000 Units, Subcutaneous, 3 times per day, Frazier Richards, MD, 5,000 Units at 09/11/14 0610 .  hydrALAZINE (APRESOLINE) injection 10 mg, 10 mg, Intravenous, Q4H PRN, Leeanne Rio, MD, 10 mg at 09/08/14 2015 .  hydrochlorothiazide (HYDRODIURIL) tablet 25 mg, 25 mg, Oral, Daily, Leone Brand, MD, 25 mg at 09/11/14 1022 .  insulin aspart (novoLOG) injection 0-15 Units, 0-15 Units, Subcutaneous, TID  WC, Frazier Richards, MD, 3 Units at 09/11/14 [phone] .  insulin aspart (novoLOG) injection 0-5 Units, 0-5 Units, Subcutaneous, QHS, Frazier Richards, MD, 3 Units at 09/10/14 2157 .  labetalol (NORMODYNE,TRANDATE) injection 10 mg, 10 mg, Intravenous, Q4H PRN, Leeanne Rio, MD .  lisinopril (PRINIVIL,ZESTRIL) tablet 20 mg, 20 mg, Oral, Daily, Leeanne Rio, MD, 20 mg at 09/11/14 1022 .  oxyCODONE-acetaminophen  (PERCOCET/ROXICET) 5-325 MG per tablet 1 tablet, 1 tablet, Oral, Q6H PRN, Lavon Paganini, MD, 1 tablet at 09/11/14 0836 .  senna-docusate (Senokot-S) tablet 1 tablet, 1 tablet, Oral, QHS PRN, Frazier Richards, MD  Patients Current Diet: DIET DYS 3  Precautions / Restrictions Precautions Precautions: Fall Precaution Comments: L hemiparesis Restrictions Weight Bearing Restrictions: No   Prior Activity Level Limited Community (1-2x/wk): Worked PT 12 hour shifts 2 X a week.  Went out about 3 X a week.   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  Current Functional Level Cognition  Arousal/Alertness: Awake/alert Overall Cognitive Status: No family/caregiver present to determine baseline cognitive functioning Orientation Level: Oriented X4 Safety/Judgement: Decreased awareness of safety General Comments: pt with overall dampened responses Attention: Sustained Sustained Attention: Appears intact Memory: Impaired Memory Impairment: Other (comment) (working memory) Awareness: Appears intact Problem Solving: Impaired Problem Solving Impairment: Verbal complex Safety/Judgment: Appears intact    Extremity Assessment (includes Sensation/Coordination)  Upper Extremity Assessment: Defer to OT evaluation;LUE deficits/detail LUE Deficits / Details: insufficient grip strength to grasp and keep hold of RW Lower Extremity Assessment: LLE deficits/detail LLE Deficits / Details: hip flex 2-/5, knee flex/ ext 3+/5, ankle 3/5 Cervical / Trunk Assessment: Normal   ADLs  Overall ADL's : Needs assistance/impaired Eating/Feeding: Set up, Supervision/ safety, Sitting Grooming: Wash/dry face, Wash/dry hands, Oral care, Applying deodorant, Min guard, Standing Upper Body Bathing: Min guard, Standing Upper Body Bathing  Details (indicate cue type and reason): cues for technique for RUE Lower Body Bathing: Sit to/from stand, Moderate assistance Upper Body Dressing : Moderate assistance, Sitting Lower Body Dressing: Moderate assistance, Sit to/from stand Toilet Transfer: Min guard, Ambulation, [facility] Toilet Transfer Details (indicate cue type and reason): holding onto things in room when ambulating Toileting- Clothing Manipulation and Hygiene: Moderate assistance, Sit to/from stand Toileting - Clothing Manipulation Details (indicate cue type and reason): assist with gown and LOB when standing at end. Functional mobility during ADLs: Min guard General ADL Comments: Encouraged pt to be using left hand for activities. Educated on dressing technique. Pt ambulated to bathroom and peformed ADLs. Assist with donning socks sitting on bed.    Mobility  Overal bed mobility: Modified Independent General bed mobility comments: Pt uses right arm primarily to lift left into position. pushed up with right and could bring trunk upright.    Transfers  Overall transfer level: Needs assistance Equipment used: Rolling walker (2 wheeled) Transfers: Sit to/from Stand Sit to Stand: Min guard General transfer comment: Had pt perform 10 sit to stand transfers serially to challenge L quad/ strength.    Ambulation / Gait / Stairs / Wheelchair Mobility  Ambulation/Gait Ambulation/Gait assistance: Physicist, medical (Feet): 100 Feet Assistive device: Rolling walker (2 wheeled) Gait Pattern/deviations: Step-through pattern, Decreased stride length, Decreased step length - left, Decreased stance time - left, Decreased weight shift to left, Trendelenburg, Trunk flexed Gait velocity: decreased Gait velocity interpretation: Below normal speed  for age/gender General Gait Details: Pt able to tolerate ambulation well with AD  saw foot drop on left    Posture / Balance Overall balance assessment: Needs assistance Sitting-balance  support: Single extremity supported Sitting balance-Leahy Scale: Good Standing balance support: No upper extremity supported Standing balance-Leahy Scale: Fair Standing balance comment: unable to accept challenges to left    Special needs/care consideration BiPAP/CPAP No CPM No Continuous Drip IV 0.9% NS 125 ml/hr Dialysis No    Life Vest No Oxygen No Special Bed No Trach Size No Wound Vac (area) No       Skin No                             Bowel mgmt: Last documented BM 09/09/14, but patient reports BM 09/10/14 Bladder mgmt: BRP with assistance Diabetic mgmt Yes, on oral medications at home, insulin while in the hospital.    Previous Home Environment Living Arrangements: Non-relatives/Friends  Lives With: Friend(s) (pt says she has been staying with friends) Available Help at Discharge: Friend(s) Type of Home: [place]: No Additional Comments: pt was recently homeless, has recently begun living with some friends, unsure how much support she has or if returning to this home is a good option for her. Will need CSW consult.  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  It is temporary, but she can return to friend's house. Caregiver Availability: Intermittent Discharge Plan Discussed with Primary Caregiver: Yes Is Caregiver In Agreement with Plan?: Yes Does Caregiver/Family have  Issues with Lodging/Transportation while Pt is in Rehab?: No  Goals/Additional Needs Patient/Family Goal for Rehab: PT/OT ST mod I goals Expected length of stay: 17-21 days Cultural Considerations: None Dietary Needs: Dys 3, thin liquids Equipment Needs: TBD Pt/Family Agrees to Admission and willing to participate: Yes Program Orientation Provided & Reviewed with Pt/Caregiver Including Roles  & Responsibilities: Yes  Decrease burden of Care through IP rehab admission: N/A  Possible need for SNF placement upon discharge: Not planned  Patient Condition: This patient's condition remains as documented in the consult dated 09/10/14, in which the Rehabilitation Physician determined and documented that the patient's condition is appropriate for intensive rehabilitative care in an inpatient rehabilitation facility. Will admit to inpatient rehab today.  Preadmission Screen Completed By:  [name], 09/11/2014 11:33 AM ______________________________________________________________________   Discussed status with Dr. Naaman Plummer on 09/11/14 at 25 and received telephone approval for admission today.  Admission Coordinator:  [name], time 1133/Date01/15/16

## 2014-09-11 NOTE — Progress Notes (Signed)
Rehab admissions - I have medical clearance and will admit to acute inpatient rehab today.  Call me for questions.  #[phone]

## 2014-09-12 ENCOUNTER — Inpatient Hospital Stay (HOSPITAL_COMMUNITY): Payer: MEDICAID | Admitting: Physical Therapy

## 2014-09-12 ENCOUNTER — Inpatient Hospital Stay (HOSPITAL_COMMUNITY): Payer: MEDICAID | Admitting: Occupational Therapy

## 2014-09-12 ENCOUNTER — Inpatient Hospital Stay (HOSPITAL_COMMUNITY): Payer: MEDICAID | Admitting: Speech Pathology

## 2014-09-12 ENCOUNTER — Inpatient Hospital Stay (HOSPITAL_COMMUNITY): Payer: Self-pay | Admitting: Occupational Therapy

## 2014-09-12 LAB — GLUCOSE, CAPILLARY
GLUCOSE-CAPILLARY: 172 mg/dL — AB (ref 70–99)
GLUCOSE-CAPILLARY: 186 mg/dL — AB (ref 70–99)
GLUCOSE-CAPILLARY: 176 mg/dL — AB (ref 70–99)
Glucose-Capillary: 274 mg/dL — ABNORMAL HIGH (ref 70–99)

## 2014-09-12 NOTE — Evaluation (Signed)
Physical Therapy Assessment and Plan  Patient Details  Name: Diamond Rice MRN: [id number] Date of Birth: 06/01/1971  PT Diagnosis: Abnormal posture, Abnormality of gait, Coordination disorder, Difficulty walking, Edema, Hemiparesis non-dominant, Hypertonia, Impaired sensation and Muscle weakness Rehab Potential:  Good ELOS: 10-14   Today's Date: 09/12/2014 PT Individual Time: 0900-1000 PT Individual Time Calculation (min): 60 min    Problem List:  Patient Active Problem List   Diagnosis Date Noted  . CVA (cerebral infarction) 09/11/2014  . Dyspnea   . CVA (cerebral vascular accident) 09/08/2014  . Pain in the chest   . Essential hypertension   . Type 2 diabetes mellitus with complication   . Back pain 09/11/2013  . Psychoactive substance-induced organic mood disorder 05/28/2013  . Cocaine abuse with cocaine-induced mood disorder 05/28/2013  . Cannabis dependence with cannabis-induced anxiety disorder 05/28/2013  . Morbid obesity with BMI of 45.0-49.9, adult 04/25/2012  . Chest pain 04/17/2012  . Hypertension 04/17/2012  . DM (diabetes mellitus), type 2, uncontrolled 04/17/2012  . Hyperlipidemia 04/17/2012  . Tobacco use 04/17/2012    Past Medical History:  Past Medical History  Diagnosis Date  . Hypertension   . Type II diabetes mellitus   . Anginal pain   . High cholesterol   . Shortness of breath 04/17/2012    "all the time"  . Headache(784.0) 04/17/2012    "~ qod; lately waking up in am w/one"  . Migraines 04/17/2012  . Chronic lower back pain 04/17/2012    "just got over some; catched when I walked"  . Depression   . Anxiety   . Sleep apnea   . Obesity    Past Surgical History:  Past Surgical History  Procedure Laterality Date  . Cardiac catheterization  ~ 2011    Assessment & Plan Clinical Impression: Diamond Rice is a 44 y.o. right handed female with history of hypertension, diabetes mellitus and peripheral neuropathy, polysubstance abuse and  medical noncompliance due to financial hardship. Patient recently homeless and has been living with friends recently. Presented 09/08/2014 with left-sided weakness and slurred speech. Patient with recent admission 08/28/2014 for chest pain and elevated blood pressure with negative troponins urine drug screen was positive at that time for marijuana workup unremarkable. MRI of the brain showed small acute infarct posterior limb of the right internal capsule and corona radiata. MRA without major occlusion or stenosis. Recent echocardiogram with ejection fraction of 65% left ventricular diastolic dysfunction. Carotid Dopplers with no ICA stenosis. Patient did not receive TPA. Neurology services consulted maintained on aspirin for CVA prophylaxis as well as subcutaneous heparin for DVT prophylaxis. Close monitoring of blood pressure. Patient is tolerating a mechanical soft diet. Physical therapy evaluation completed 09/09/2014 with recommendations of physical medicine rehabilitation consult.  Patient transferred to CIR on 09/11/2014 .   Patient currently requires min with mobility secondary to muscle weakness, abnormal tone and decreased coordination and decreased sitting balance, decreased standing balance, decreased postural control, hemiplegia and decreased balance strategies.  Prior to hospitalization, patient was independent  with mobility and lived with Friend(s) in a House home.  Home access is  Level entry.  Patient will benefit from skilled PT intervention to maximize safe functional mobility, minimize fall risk and decrease caregiver burden for planned discharge home alone.  Anticipate patient will benefit from follow up OP at discharge.     Skilled Therapeutic Intervention PT Evaluation: PT completes evaluation and notes that pt has a good prognosis to be a functional ambulater at d/c.   Pt presents with L side hemiplegia, arm weaker than leg, slightly diminished sensation to L side, but proprioception  appears intact on day of eval. Pt req up to min A with mobility and PT explained the "use it or lose it' phenomenon to pt. Pt has no equipment and no specific d/c destination. Pt will benefit from IPR to maximize functional independence and return to the community.   Gait Training: PT instructs pt in ambulation with RW x 95' req min A for balance: weak L hip flexor compensation with posterior trunk lean noted, slight L knee hyperextension in stance phase, and L foot slap during loading noted as gait abnormalities.  PT instructs pt in ascending/descending 3 low stairs (5") with 1 rail (pt attempts to hold L side rail, but hand slides off when attention is focused onto legs) req min A for balance and verbal cues for step-to technique.   Therapeutic Activity: PT instructs pt in sit to stand multiple times during PT session req min A with RW, stand-pivot transfer w/c to bed req min A, sit to supine req SBA for safety.    PT Evaluation Precautions/Restrictions Precautions Precautions: Fall Precaution Comments: L hemiparesis Restrictions Weight Bearing Restrictions: No General Chart Reviewed: Yes Family/Caregiver Present: No  Pain Pain Assessment Pain Assessment: No/denies pain Home Living/Prior Functioning Home Living Available Help at Discharge: Friend(s) Type of Home: House Home Access: Level entry Home Layout: One level Additional Comments: pt was recently homeless, has recently begun living with some friends, unsure how much support she has or if returning to this home is a good option for her. Will need CSW consult.  Lives With: Friend(s) Prior Function Level of Independence: Independent with gait  Able to Take Stairs?: Reciprically Driving: No Vocation: Part time employment Vocation Requirements: physical job due to SNF CNA Comments: pt works part time as a CNA at a SNF Vision/Perception  Vision - Assessment Eye Alignment: Within Functional Limits Ocular Range of Motion:  Within Functional Limits Tracking/Visual Pursuits: Able to track stimulus in all quads without difficulty  Cognition Overall Cognitive Status: No family/caregiver present to determine baseline cognitive functioning Arousal/Alertness: Awake/alert Orientation Level: Oriented X4 Attention: Focused;Sustained Focused Attention: Appears intact Sustained Attention: Appears intact Selective Attention: Appears intact Awareness: Appears intact Problem Solving: Impaired Safety/Judgment: Appears intact Sensation Sensation Light Touch: Impaired Detail Light Touch Impaired Details: Impaired LUE Stereognosis: Not tested Hot/Cold: Not tested Proprioception: Appears Intact Additional Comments: diminished to LT in L arm and L leg Coordination Gross Motor Movements are Fluid and Coordinated: No Fine Motor Movements are Fluid and Coordinated: No Coordination and Movement Description: slowed movements L arm; only gross movements noted - poor fine motor control Finger Nose Finger Test: slowed, impaired accuracy L arm Heel Shin Test: slowed, not smooth movements L leg Motor  Motor Motor: Hemiplegia;Abnormal tone;Abnormal postural alignment and control Motor - Skilled Clinical Observations: L side hemiplegic  Mobility Bed Mobility Bed Mobility: Sit to Supine Sit to Supine: 5: Supervision;With rail Sit to Supine - Details: Verbal cues for safe use of DME/AE Transfers Transfers: Yes Sit to Stand: 4: Min assist Sit to Stand Details: Manual facilitation for placement;Verbal cues for technique Sit to Stand Details (indicate cue type and reason): hand placement Stand Pivot Transfers: 4: Min assist Stand Pivot Transfer Details: Manual facilitation for placement;Verbal cues for technique Stand Pivot Transfer Details (indicate cue type and reason): hand placement Locomotion  Ambulation Ambulation: Yes Ambulation/Gait Assistance: 4: Min assist Ambulation Distance (Feet): 95 Feet Assistive device:    Rolling walker Ambulation/Gait Assistance Details: Manual facilitation for placement Ambulation/Gait Assistance Details: min A for balance Gait Gait: Yes Gait Pattern: Impaired Gait Pattern: Step-through pattern;Left genu recurvatum (L foot slap, trunk extension to assist with L leg progression) Gait velocity: decreased Stairs / Additional Locomotion Stairs: Yes Stairs Assistance: 4: Min assist Stairs Assistance Details: Verbal cues for sequencing;Verbal cues for technique;Manual facilitation for placement Stairs Assistance Details (indicate cue type and reason): step-to pattern; up with strong and down with weak Stair Management Technique: One rail Right Number of Stairs: 3 Height of Stairs: 5 Wheelchair Mobility Wheelchair Mobility: Yes Wheelchair Assistance: 2: Max assist Wheelchair Assistance Details: Manual facilitation for placement Wheelchair Propulsion: Right upper extremity Wheelchair Parts Management: Needs assistance Distance: 200  Trunk/Postural Assessment  Cervical Assessment Cervical Assessment: Within Functional Limits Thoracic Assessment Thoracic Assessment: Within Functional Limits Lumbar Assessment Lumbar Assessment: Within Functional Limits Postural Control Postural Control: Deficits on evaluation Trunk Control: decreased excursions to L side Righting Reactions: slowed righting from L side leans Protective Responses: impaired on L side of body due to hemiplegia Postural Limitations: slowed/delayed movements L extremities and apprehensive leans to L side of body due to impaired control  Balance Balance Balance Assessed: Yes Static Sitting Balance Static Sitting - Balance Support: Feet supported Static Sitting - Level of Assistance: 5: Stand by assistance Dynamic Sitting Balance Dynamic Sitting - Balance Support: Feet supported;Right upper extremity supported Dynamic Sitting - Level of Assistance: 5: Stand by assistance Dynamic Sitting - Balance  Activities: Lateral lean/weight shifting;Forward lean/weight shifting;Reaching across midline Static Standing Balance Static Standing - Balance Support: Right upper extremity supported;During functional activity Static Standing - Level of Assistance: 4: Min assist Dynamic Standing Balance Dynamic Standing - Balance Support: Right upper extremity supported;During functional activity Dynamic Standing - Level of Assistance: 4: Min assist Dynamic Standing - Balance Activities: Lateral lean/weight shifting Extremity Assessment  RUE Assessment RUE Assessment: Within Functional Limits LUE Assessment LUE Assessment: Exceptions to WFL LUE AROM (degrees) Overall AROM Left Upper Extremity: Deficits LUE Overall AROM Comments: arm flexion to 150 degrees, elbow flexion to 100 degrees, minimal lack of finger flexion/extension when making/opening fist LUE Strength LUE Overall Strength: Deficits LUE Overall Strength Comments: arm flexion 3-/5, elbow flexion 3-/5, elbow extension 2/5, grip 3-/5 LUE Tone LUE Tone: Modified Ashworth Modified Ashworth Scale for Grading Hypertonia LUE: Slight increase in muscle tone, manifested by a catch, followed by minimal resistance throughout the remainder (less than half) of the ROM LUE Tone Comments: elbow flexor tendons RLE Assessment RLE Assessment: Within Functional Limits LLE Assessment LLE Assessment: Exceptions to WFL LLE AROM (degrees) Overall AROM Left Lower Extremity: Deficits;Due to decreased strength LLE Overall AROM Comments: hip flexion limited 25%, knee wfl, ankle wfl LLE Strength LLE Overall Strength: Deficits LLE Overall Strength Comments: hip flexion 3-/5, knee grossly 4/5, ankle DF 3+/5 LLE Tone LLE Tone: Within Functional Limits  FIM:  FIM - Locomotion: Wheelchair Distance: 200 FIM - Locomotion: Ambulation Ambulation/Gait Assistance: 4: Min assist   Refer to Care Plan for Long Term Goals  Recommendations for other services:  None  Discharge Criteria: Patient will be discharged from PT if patient refuses treatment 3 consecutive times without medical reason, if treatment goals not met, if there is a change in medical status, if patient makes no progress towards goals or if patient is discharged from hospital.  The above assessment, treatment plan, treatment alternatives and goals were discussed and mutually agreed upon: by patient  , M 09/12/2014, 9:57 AM  

## 2014-09-12 NOTE — Evaluation (Signed)
Occupational Therapy Assessment and Plan  Patient Details  Name: Diamond Rice MRN: [id number] Date of Birth: 10/14/1970  OT Diagnosis: hemiplegia affecting non-dominant side, lumbago (low back pain) and muscle weakness (generalized) Rehab Potential: Rehab Potential (ACUTE ONLY): Good ELOS: 10-14 days   Today's Date: 09/12/2014 OT Individual Time: 2706-2376 OT Individual Time Calculation (min): 60 min     Problem List:  Patient Active Problem List   Diagnosis Date Noted  . CVA (cerebral infarction) 09/11/2014  . Dyspnea   . CVA (cerebral vascular accident) 09/08/2014  . Pain in the chest   . Essential hypertension   . Type 2 diabetes mellitus with complication   . Back pain 09/11/2013  . Psychoactive substance-induced organic mood disorder 05/28/2013  . Cocaine abuse with cocaine-induced mood disorder 05/28/2013  . Cannabis dependence with cannabis-induced anxiety disorder 05/28/2013  . Morbid obesity with BMI of 45.0-49.9, adult 04/25/2012  . Chest pain 04/17/2012  . Hypertension 04/17/2012  . DM (diabetes mellitus), type 2, uncontrolled 04/17/2012  . Hyperlipidemia 04/17/2012  . Tobacco use 04/17/2012    Past Medical History:  Past Medical History  Diagnosis Date  . Hypertension   . Type II diabetes mellitus   . Anginal pain   . High cholesterol   . Shortness of breath 04/17/2012    "all the time"  . Headache(784.0) 04/17/2012    "~ qod; lately waking up in am w/one"  . Migraines 04/17/2012  . Chronic lower back pain 04/17/2012    "just got over some; catched when I walked"  . Depression   . Anxiety   . Sleep apnea   . Obesity    Past Surgical History:  Past Surgical History  Procedure Laterality Date  . Cardiac catheterization  ~ 2011    Assessment & Plan Clinical Impression: Patient is a 44 y.o. right handed female with history of hypertension, diabetes mellitus and peripheral neuropathy, polysubstance abuse and medical noncompliance due to financial  hardship. Patient recently homeless and has been living with friends recently. Presented 09/08/2014 with left-sided weakness and slurred speech. Patient with recent admission 08/28/2014 for chest pain and elevated blood pressure with negative troponins urine drug screen was positive at that time for marijuana workup unremarkable. MRI of the brain showed small acute infarct posterior limb of the right internal capsule and corona radiata. MRA without major occlusion or stenosis. Recent echocardiogram with ejection fraction of 65% left ventricular diastolic dysfunction. Carotid Dopplers with no ICA stenosis. Patient did not receive TPA. Neurology services consulted maintained on aspirin for CVA prophylaxis as well as subcutaneous heparin for DVT prophylaxis. Close monitoring of blood pressure. Patient is tolerating a mechanical soft diet. Physical therapy evaluation completed 09/09/2014 with recommendations of physical medicine rehabilitation consult.  Patient transferred to CIR on 09/11/2014 .    Patient currently requires mod with basic self-care skills secondary to muscle weakness, impaired timing and sequencing, unbalanced muscle activation and decreased coordination and decreased standing balance and decreased balance strategies.  Prior to hospitalization, patient could complete ADLs with independent .  Patient will benefit from skilled intervention to increase independence with basic self-care skills and increase level of independence with iADL prior to discharge home independently.  Anticipate patient will require intermittent supervision and follow up outpatient.  OT - End of Session Activity Tolerance: Tolerates 30+ min activity without fatigue Endurance Deficit: No OT Assessment Rehab Potential (ACUTE ONLY): Good Barriers to Discharge: Other (comment) Barriers to Discharge Comments: homeless, unsure how long or if she can return  to friend's house  OT Patient demonstrates impairments in the  following area(s): Balance;Endurance;Motor;Sensory OT Basic ADL's Functional Problem(s): Grooming;Bathing;Dressing;Toileting;Eating OT Advanced ADL's Functional Problem(s): Simple Meal Preparation;Laundry OT Transfers Functional Problem(s): Toilet;Tub/Shower OT Additional Impairment(s): Fuctional Use of Upper Extremity OT Plan OT Intensity: Minimum of 1-2 x/day, 45 to 90 minutes OT Frequency: 5 out of 7 days OT Duration/Estimated Length of Stay: 10-14 days OT Treatment/Interventions: Balance/vestibular training;Discharge planning;Community reintegration;Disease Lawyer;Functional mobility training;Neuromuscular re-education;Pain management;Patient/family education;Psychosocial support;Self Care/advanced ADL retraining;Therapeutic Activities;Therapeutic Exercise;UE/LE Strength taining/ROM;UE/LE Coordination activities OT Self Feeding Anticipated Outcome(s): Mod I OT Basic Self-Care Anticipated Outcome(s): Mod I OT Toileting Anticipated Outcome(s): Mod I OT Bathroom Transfers Anticipated Outcome(s): Mod I OT Recommendation Patient destination: Home Follow Up Recommendations: Outpatient OT Equipment Recommended: Tub/shower bench   Skilled Therapeutic Intervention OT eval with focus on rehab process, OT purpose, and discussion of goals and ELOS.  Pt asleep upon arrival but easily aroused.  ADL assessment at sit > stand in room shower with min/steady assist for balance.  Pt ambulated to bathroom with min- mod assist for balance and weight shift and noted decreased gait speed.  Pt with no clothes on eval, however donned Rt sock with increased time and required assist to don Lt sock. Encouraged pt to incorporate LUE as gross assist during self-care tasks with focus on grasp/release and AROM.  Educated on use of LUE during self-feeding task to increase use.  OT Evaluation Precautions/Restrictions  Precautions Precautions: Fall Precaution Comments: L  hemiparesis Restrictions Weight Bearing Restrictions: No Pain Pain Assessment Pain Assessment: No/denies pain Home Living/Prior Functioning Home Living Available Help at Discharge: Friend(s) Type of Home: House Home Access: Level entry Home Layout: One level Additional Comments: pt was recently homeless, has recently begun living with some friends, unsure how much support she has or if returning to this home is a good option for her. Will need CSW consult.  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  See FIM Vision/Perception  Vision- History Baseline Vision/History: No visual deficits Patient Visual Report: No change from baseline Vision- Assessment Vision Assessment?: Yes Eye Alignment: Within Functional Limits Ocular Range of Motion: Within Functional Limits Tracking/Visual Pursuits: Able to track stimulus in all quads without difficulty  Cognition Overall Cognitive Status: No family/caregiver present to determine baseline cognitive functioning Arousal/Alertness: Awake/alert Orientation Level: Oriented X4 Attention: Focused;Sustained Focused Attention: Appears intact Sustained Attention: Appears intact Selective Attention: Appears intact Awareness: Appears intact Problem Solving: Impaired Safety/Judgment: Appears intact Sensation Sensation Light Touch: Impaired Detail Light Touch Impaired Details: Impaired LUE Stereognosis: Not tested Hot/Cold: Not tested Proprioception: Appears Intact Additional Comments: diminished to LT in L arm and L leg Coordination Gross Motor Movements  are Fluid and Coordinated: No Fine Motor Movements are Fluid and Coordinated: No Coordination and Movement Description: slowed movements L arm; only gross movements noted - poor fine motor control Finger Nose Finger Test: slowed, impaired accuracy L arm Heel Shin Test: slowed, not smooth movements L leg Motor  Motor Motor: Hemiplegia;Abnormal tone;Abnormal postural alignment and control Motor - Skilled Clinical Observations: L side hemiplegic Mobility  Bed Mobility Bed Mobility: Sit to Supine Sit to Supine: 5: Supervision;With rail Sit to Supine - Details:  Verbal cues for safe use of DME/AE Transfers Sit to Stand: 4: Min assist Sit to Stand Details: Manual facilitation for placement;Verbal cues for technique Sit to Stand Details (indicate cue type and reason): hand placement  Trunk/Postural Assessment  Cervical Assessment Cervical Assessment: Within Functional Limits Thoracic Assessment Thoracic Assessment: Within Functional Limits Lumbar Assessment Lumbar Assessment: Within Functional Limits Postural Control Postural Control: Deficits on evaluation Trunk Control: decreased excursions to L side Righting Reactions: slowed righting from L side leans Protective Responses: impaired on L side of body due to hemiplegia Postural Limitations: slowed/delayed movements L extremities and apprehensive leans to L side of body due to impaired control  Balance Balance Balance Assessed: Yes Static Sitting Balance Static Sitting - Balance Support: Feet supported Static Sitting - Level of Assistance: 5: Stand by assistance Dynamic Sitting Balance Dynamic Sitting - Balance Support: Feet supported;Right upper extremity supported Dynamic Sitting - Level of Assistance: 5: Stand by assistance Dynamic Sitting - Balance Activities: Lateral lean/weight shifting;Forward lean/weight shifting;Reaching across midline Static Standing Balance Static Standing - Balance Support: Right upper extremity  supported;During functional activity Static Standing - Level of Assistance: 4: Min assist Dynamic Standing Balance Dynamic Standing - Balance Support: Right upper extremity supported;During functional activity Dynamic Standing - Level of Assistance: 4: Min assist Dynamic Standing - Balance Activities: Lateral lean/weight shifting Extremity/Trunk Assessment RUE Assessment RUE Assessment: Within Functional Limits LUE Assessment LUE Assessment: Exceptions to WFL LUE AROM (degrees) Overall AROM Left Upper Extremity: Deficits LUE Overall AROM Comments: shoulder flexion to 120 degrees, elbow flexion to 100 degrees, minimal lack of finger flexion/extension when making/opening fist LUE Strength LUE Overall Strength: Deficits LUE Overall Strength Comments: shoulder flexion 2/5, elbow flexion 3-/5, elbow extension 2/5, grip 3-/5 LUE Tone LUE Tone: Modified Ashworth Modified Ashworth Scale for Grading Hypertonia LUE: Slight increase in muscle tone, manifested by a catch, followed by minimal resistance throughout the remainder (less than half) of the ROM LUE Tone Comments: elbow flexor tendons  FIM:  FIM - Eating Eating Activity: 5: Set-up assist for open containers FIM - Grooming Grooming Steps: Wash, rinse, dry face;Wash, rinse, dry hands Grooming: 3: Patient completes 2 of 4 or 3 of 5 steps FIM - Bathing Bathing Steps Patient Completed: Chest;Right Arm;Left Arm;Abdomen;Front perineal area;Buttocks;Right upper leg;Left upper leg Bathing: 4: Min-Patient completes 8-9 3f10 parts or 75+ percent FIM - Upper Body Dressing/Undressing Upper body dressing/undressing: 0: Wears gown/pajamas-no public clothing FIM - Lower Body Dressing/Undressing Lower body dressing/undressing steps patient completed: Don/Doff right sock Lower body dressing/undressing: 2: Max-Patient completed 25-49% of tasks FIM - Toileting Toileting steps completed by patient: Performs perineal hygiene;Adjust clothing after  toileting;Adjust clothing prior to toileting Toileting Assistive Devices: Grab bar or rail for support Toileting: 4: Steadying assist FIM - BControl and instrumentation engineerDevices: Bed rails Bed/Chair Transfer: 4: Supine > Sit: Min A (steadying Pt. > 75%/lift 1 leg);4: Bed > Chair or W/C: Min A (steadying Pt. > 75%) FIM - TRadio producerDevices: Grab bars Toilet Transfers: 4-To toilet/BSC: Min A (steadying Pt. > 75%);4-From toilet/BSC: Min A (steadying Pt. > 75%) FIM - TSystems developerDevices: Shower chair;Grab bars;Walk in shower Tub/shower Transfers: 4-Into Tub/Shower: Min A (steadying Pt. > 75%/lift 1 leg);4-Out of Tub/Shower: Min A (steadying Pt. > 75%/lift 1 leg)   Refer to Care Plan for Long Term Goals  Recommendations for other services: None  Discharge Criteria: Patient will be discharged from OT if patient refuses treatment 3 consecutive times without medical  reason, if treatment goals not met, if there is a change in medical status, if patient makes no progress towards goals or if patient is discharged from hospital.  The above assessment, treatment plan, treatment alternatives and goals were discussed and mutually agreed upon: by patient  Simonne Come 09/12/2014, 10:44 AM

## 2014-09-12 NOTE — Evaluation (Signed)
Speech Language Pathology Assessment and Plan  Patient Details  Name: Diamond Rice MRN: [id number] Date of Birth: Aug 27, 1971  SLP Diagnosis: Dysarthria;Dysphagia  Rehab Potential: Excellent ELOS: 5-10 days    Today's Date: 09/12/2014 SLP Individual Time:  1000- 60     Problem List:  Patient Active Problem List   Diagnosis Date Noted  . CVA (cerebral infarction) 09/11/2014  . Dyspnea   . CVA (cerebral vascular accident) 09/08/2014  . Pain in the chest   . Essential hypertension   . Type 2 diabetes mellitus with complication   . Back pain 09/11/2013  . Psychoactive substance-induced organic mood disorder 05/28/2013  . Cocaine abuse with cocaine-induced mood disorder 05/28/2013  . Cannabis dependence with cannabis-induced anxiety disorder 05/28/2013  . Morbid obesity with BMI of 45.0-49.9, adult 04/25/2012  . Chest pain 04/17/2012  . Hypertension 04/17/2012  . DM (diabetes mellitus), type 2, uncontrolled 04/17/2012  . Hyperlipidemia 04/17/2012  . Tobacco use 04/17/2012   Past Medical History:  Past Medical History  Diagnosis Date  . Hypertension   . Type II diabetes mellitus   . Anginal pain   . High cholesterol   . Shortness of breath 04/17/2012    "all the time"  . Headache(784.0) 04/17/2012    "~ qod; lately waking up in am w/one"  . Migraines 04/17/2012  . Chronic lower back pain 04/17/2012    "just got over some; catched when I walked"  . Depression   . Anxiety   . Sleep apnea   . Obesity    Past Surgical History:  Past Surgical History  Procedure Laterality Date  . Cardiac catheterization  ~ 2011    Assessment / Plan / Recommendation Clinical Impression Diamond Rice is a 44 y.o. right handed female with history of hypertension, diabetes mellitus and peripheral neuropathy, polysubstance abuse and medical noncompliance due to financial hardship. Patient recently homeless and has been living with friends recently. Presented 09/08/2014 with left-sided  weakness and slurred speech. Patient with recent admission 08/28/2014 for chest pain and elevated blood pressure with negative troponins urine drug screen was positive at that time for marijuana workup unremarkable. MRI of the brain showed small acute infarct posterior limb of the right internal capsule and corona radiata. MRA without major occlusion or stenosis. Recent echocardiogram with ejection fraction of 65% left ventricular diastolic dysfunction. Carotid Dopplers with no ICA stenosis. Patient did not receive TPA. Neurology services consulted maintained on aspirin for CVA prophylaxis as well as subcutaneous heparin for DVT prophylaxis. Close monitoring of blood pressure. Patient is tolerating a mechanical soft diet. Physical therapy evaluation completed 09/09/2014 with recommendations of physical medicine rehabilitation consult. Patient was admitted for a comprehensive rehabilitation program. Pt would benefit from speech-lang/dysphagia rehab to increase overall functional independence related to dysarthria and swallow function/safety.   Skilled Therapeutic Interventions          Pt completed speech-language evaluation with cognitive abilities determined WFL. Pt 90-100% accurate with problem solving and memory tasks, stating that this is her baseline. Expressive and receptive communication [facility]. Pt follows multi-step directions and answers complex Y/N questions with 100% accuracy. Pt reports that her voice sounds different. Harsh, low volume noted at conversation level along with decreased breath support. Pt completed oral motor exam and limited bedside swallow evaluation. Pt noted with mild pocketing of L-side, which she was able to clear independently. Pt self-managed swallow strategies during evaluation session. No overt s/s of aspiration noted with thin/ D3 textures. Pt did report that she  occasionally has trouble with mucous build up which makes swallowing difficult.   SLP Assessment  Patient will need  skilled Speech Lanaguage Pathology Services during CIR admission    Recommendations  Recommended Consults: MBS;FEES Diet Recommendations: Dysphagia 3 (Mechanical Soft);Thin liquid Liquid Administration via: Cup;Straw Medication Administration: Whole meds with liquid Supervision: Patient able to self feed;Intermittent supervision to cue for compensatory strategies Compensations: Slow rate;Small sips/bites;Check for pocketing Postural Changes and/or Swallow Maneuvers: Seated upright 90 degrees;Upright 30-60 min after meal Oral Care Recommendations: Oral care BID Patient destination: Home Follow up Recommendations: Skilled Nursing facility Equipment Recommended: None recommended by SLP    SLP Frequency 5 out of 7 days   SLP Treatment/Interventions Cueing hierarchy;Functional tasks;Therapeutic Activities;Internal/external aids;Oral motor exercises;Patient/family education;Dysphagia/aspiration precaution training    Pain   Prior Functioning Cognitive/Linguistic Baseline: Within functional limits Type of Home: House  Lives With: Friend(s) Available Help at Discharge: Friend(s) Education: high school graduate Vocation: Unemployed  Short Term Goals: Week 1: SLP Short Term Goal 1 (Week 1): Patient will tolerate trials of advanced consistencies in order to upgrade diet, min A SLP Short Term Goal 2 (Week 1): Pt will target L-sided weakness by completing oral motor exercises with use of visual aide, min A.  SLP Short Term Goal 3 (Week 1): Pt will improve speech intelligibility at the phrase level to ~90% accuracy with mod cues for use of dysarthria strategies.   See FIM for current functional status Refer to Care Plan for Long Term Goals  Recommendations for other services: None  Discharge Criteria: Patient will be discharged from SLP if patient refuses treatment 3 consecutive times without medical reason, if treatment goals not met, if there is a change in medical status, if patient makes  no progress towards goals or if patient is discharged from hospital.  The above assessment, treatment plan, treatment alternatives and goals were discussed and mutually agreed upon: by patient  Adele Dan 09/12/2014, 4:11 PM

## 2014-09-12 NOTE — Progress Notes (Signed)
Diamond Rice is a 44 y.o. female 1970/10/13 [id number]  Subjective: No new complaints. No new problems. Slept well. Feeling OK.  Objective: Vital signs in last 24 hours: Temp:  [97.9 F (36.6 C)-98.4 F (36.9 C)] 98 F (36.7 C) (01/16 0616) Pulse Rate:  [75-89] 75 (01/16 0616) Resp:  [17-20] 17 (01/16 0616) BP: (128-164)/(91-102) 145/91 mmHg (01/16 0616) SpO2:  [99 %-100 %] 99 % (01/16 0616) Weight change:  Last BM Date: 09/10/14  Intake/Output from previous day: 01/15 0701 - 01/16 0700 In: 360 [P.O.:360] Out: 2 [Urine:2] Last cbgs: CBG (last 3)   Recent Labs  09/11/14 1616 09/11/14 2103 09/12/14 0701  GLUCAP 207* 272* 172*     Physical Exam General: No apparent distress.  Obese HEENT: not dry Lungs: Normal effort. Lungs clear to auscultation, no crackles or wheezes. Cardiovascular: Regular rate and rhythm, no edema Abdomen: S/NT/ND; BS(+) Musculoskeletal:  unchanged Neurological: No new neurological deficits Wounds: N/A    Skin: clear  Aging changes Mental state: Alert, oriented, cooperative    Lab Results: BMET    Component Value Date/Time   NA 139 09/10/2014 0341   K 3.7 09/10/2014 0341   CL 102 09/10/2014 0341   CO2 26 09/10/2014 0341   GLUCOSE 175* 09/10/2014 0341   BUN 9 09/10/2014 0341   CREATININE 1.05 09/11/2014 1715   CREATININE 1.11* 05/17/2012 1511   CALCIUM 9.2 09/10/2014 0341   GFRNONAA 65* 09/11/2014 1715   GFRAA 75* 09/11/2014 1715   CBC    Component Value Date/Time   WBC 9.5 09/11/2014 1715   RBC 5.39* 09/11/2014 1715   HGB 12.7 09/11/2014 1715   HCT 38.8 09/11/2014 1715   PLT 380 09/11/2014 1715   MCV 72.0* 09/11/2014 1715   MCH 23.6* 09/11/2014 1715   MCHC 32.7 09/11/2014 1715   RDW 15.6* 09/11/2014 1715   LYMPHSABS 3.7 09/08/2014 1011   MONOABS 0.8 09/08/2014 1011   EOSABS 0.3 09/08/2014 1011   BASOSABS 0.1 09/08/2014 1011    Studies/Results: No results found.  Medications: I have reviewed the patient's  current medications.  Assessment/Plan:   1. Functional deficits secondary to Right PLIC infarct/CR infarct 2. DVT Prophylaxis/Anticoagulation: Subcutaneous heparin. Monitor platelet counts or any signs of bleeding 3. Pain Management: Tylenol as needed 4. Dysphagia. Mechanical soft diet. Follow-up speech therapy 5. Neuropsych: This patient is capable of making decisions on her own behalf. 6. Skin/Wound Care: Routine skin checks 7. Fluids/Electrolytes/Nutrition: Strict I and O follow-up chemistries 8. Hypertension. Norvasc 10 mg daily, hydrochlorothiazide 25 mg daily, lisinopril 20 mg daily. Monitor for increased mobility 9. Diabetes mellitus with peripheral neuropathy. Hemoglobin A1c 10.3. Currently with sliding scale insulin. Patient on Glucotrol 10 mg twice a day and Glucophage 1000 mg twice a day prior to admission but was not taking due to medical compliance and financial constraints. She will need full diabetic teaching 10. Hyperlipidemia. Lipitor 11. Medical noncompliance. Documentation of medical noncompliance and financial constraints prior to admission. Provide counseling and social work follow-up 12. History of polysubstance abuse. Counseling   Continue w/current treatment   Length of stay, days: 1  Walker Kehr , MD 09/12/2014, 9:36 AM

## 2014-09-13 ENCOUNTER — Inpatient Hospital Stay (HOSPITAL_COMMUNITY): Payer: MEDICAID

## 2014-09-13 DIAGNOSIS — R110 Nausea: Secondary | ICD-10-CM | POA: Diagnosis not present

## 2014-09-13 LAB — GLUCOSE, CAPILLARY
GLUCOSE-CAPILLARY: 218 mg/dL — AB (ref 70–99)
GLUCOSE-CAPILLARY: 199 mg/dL — AB (ref 70–99)
Glucose-Capillary: 178 mg/dL — ABNORMAL HIGH (ref 70–99)
Glucose-Capillary: 159 mg/dL — ABNORMAL HIGH (ref 70–99)

## 2014-09-13 MED ORDER — TRAMADOL HCL 50 MG PO TABS
50.0000 mg | ORAL_TABLET | Freq: Three times a day (TID) | ORAL | Status: DC | PRN
  Administered 2014-09-13 – 2014-09-26 (×18): 50 mg via ORAL
  Filled 2014-09-13 (×18): qty 1

## 2014-09-13 NOTE — Progress Notes (Addendum)
Diamond Rice is a 44 y.o. female Jul 22, 1971 [id number]  Subjective: No new complaints. Nauseated a little this am. Slept well. C/o LBP bad, 10/10 per pt. She was given Tylenol  Objective: Vital signs in last 24 hours: Temp:  [98.2 F (36.8 C)-98.5 F (36.9 C)] 98.5 F (36.9 C) (01/17 0617) Pulse Rate:  [80-94] 80 (01/17 0617) Resp:  [16-17] 16 (01/17 0617) BP: (122-160)/(79-93) 122/79 mmHg (01/17 0617) SpO2:  [98 %-100 %] 100 % (01/17 0617) Weight change:  Last BM Date: 09/12/14  Intake/Output from previous day: 01/16 0701 - 01/17 0700 In: 840 [P.O.:840] Out: -  Last cbgs: CBG (last 3)   Recent Labs  09/12/14 1658 09/12/14 2026 09/13/14 0613  GLUCAP 176* 274* 178*   Physical Exam General: No apparent distress.  Obese HEENT: not dry Lungs: Normal effort. Lungs clear to auscultation, no crackles or wheezes. Cardiovascular: Regular rate and rhythm, no edema Abdomen: S/NT/ND; BS(+) Musculoskeletal:  LS spine is tender w/ROM per pt. Pt is snoozing, however Neurological: No new neurological deficits Wounds: N/A    Skin: clear  Aging changes Mental state: Alert, oriented, cooperative    Lab Results: BMET    Component Value Date/Time   NA 139 09/10/2014 0341   K 3.7 09/10/2014 0341   CL 102 09/10/2014 0341   CO2 26 09/10/2014 0341   GLUCOSE 175* 09/10/2014 0341   BUN 9 09/10/2014 0341   CREATININE 1.05 09/11/2014 1715   CREATININE 1.11* 05/17/2012 1511   CALCIUM 9.2 09/10/2014 0341   GFRNONAA 65* 09/11/2014 1715   GFRAA 75* 09/11/2014 1715   CBC    Component Value Date/Time   WBC 9.5 09/11/2014 1715   RBC 5.39* 09/11/2014 1715   HGB 12.7 09/11/2014 1715   HCT 38.8 09/11/2014 1715   PLT 380 09/11/2014 1715   MCV 72.0* 09/11/2014 1715   MCH 23.6* 09/11/2014 1715   MCHC 32.7 09/11/2014 1715   RDW 15.6* 09/11/2014 1715   LYMPHSABS 3.7 09/08/2014 1011   MONOABS 0.8 09/08/2014 1011   EOSABS 0.3 09/08/2014 1011   BASOSABS 0.1 09/08/2014 1011     Studies/Results: No results found.  Medications: I have reviewed the patient's current medications.  Assessment/Plan:   1. Functional deficits secondary to Right PLIC infarct/CR infarct 2. DVT Prophylaxis/Anticoagulation: Subcutaneous heparin. Monitor platelet counts or any signs of bleeding 3. Pain Management: Tylenol as needed 4. Dysphagia. Mechanical soft diet. Follow-up speech therapy 5. Neuropsych: This patient is capable of making decisions on her own behalf. 6. Skin/Wound Care: Routine skin checks 7. Fluids/Electrolytes/Nutrition: Strict I and O follow-up chemistries 8. Hypertension. Norvasc 10 mg daily, hydrochlorothiazide 25 mg daily, lisinopril 20 mg daily. Monitor for increased mobility 9. Diabetes mellitus with peripheral neuropathy. Hemoglobin A1c 10.3. Currently with sliding scale insulin. Patient on Glucotrol 10 mg twice a day and Glucophage 1000 mg twice a day prior to admission but was not taking due to medical compliance and financial constraints. She will need full diabetic teaching 10. Hyperlipidemia. Lipitor 11. Medical noncompliance. Documentation of medical noncompliance and financial constraints prior to admission. Provide counseling and social work follow-up 12. History of polysubstance abuse. Counseling 13. Nausea - will watch 14. LBP complaint. Will watch. Tylenol prn. Tramadol for severe pain   Continue w/ treatment   Length of stay, days: 2  Walker Kehr , MD 09/13/2014, 9:01 AM

## 2014-09-13 NOTE — Progress Notes (Signed)
Physical Therapy Session Note  Patient Details  Name: Diamond Rice MRN: [id number] Date of Birth: 01-22-1971  Today's Date: 09/13/2014 PT Individual Time: 1100-1200 PT Individual Time Calculation (min): 60 min   Short Term Goals: Week 1:  PT Short Term Goal 1 (Week 1): Pt will demonstrate transfers w/c to/from bed req SBA.  PT Short Term Goal 2 (Week 1): Pt will ambulate >= 150' with RW req SBA. PT Short Term Goal 3 (Week 1): Pt will ascend/descend >= 5 stairs with 1 or 2 rails req SBA. PT Short Term Goal 4 (Week 1): Pt will demonstrate mod I bed mobility.  PT Short Term Goal 5 (Week 1): Pt will demonstrate car transfer with CGA.   Skilled Therapeutic Interventions/Progress Updates:    Pt received supine in bed, agreeable to participate in therapy. Pt complaining of 10/10 back pain, RN aware and administered medication prior to session. Session focused on gait training, functional endurance, stair training. Pt moved supine>sit w/ SBA and moved bed>w/c w/ Min Guard A w/ stand pivot. Ambulated 100'x4 w/ Min Guard w/ RW. Pts gait characterized by intermittent L hyperextension in stance phase and L foot drag. Hyperextension controlled with min manual facilitation to L distal hamstraings during mid-stance. Pt demonstrated carryover of controlling L knee for 3-5 steps after facilitation before going back to hyperextension pattern. Instructed pt forward/backward stepping w/ R foot w/ knee cage on L to maintain flexion for proprioceptive feedback of flexion in stance. Instructed pt in foot taps w/ L foot on 5" step then 7" step for dynamic balance and to increase L hip flexion with upright posture. Stair training 5 stairs w/ Min Guard after initial cueing for step pattern w/ 1 rail, no knee buckling or LOB noted. Pt tolerated session well. Session ended in pt's room, where pt was left seated in w/c w/ RN present w/ all needs within reach.    Therapy Documentation Precautions:   Precautions Precautions: Fall Precaution Comments: L hemiparesis Restrictions Weight Bearing Restrictions: No Pain:   10/10 low back pain, RN aware.  See FIM for current functional status  Therapy/Group: Individual Therapy  Rada Hay  Rada Hay, PT, DPT 09/13/2014, 7:49 AM

## 2014-09-14 ENCOUNTER — Inpatient Hospital Stay (HOSPITAL_COMMUNITY): Payer: Self-pay | Admitting: Physical Therapy

## 2014-09-14 ENCOUNTER — Encounter (HOSPITAL_COMMUNITY): Payer: Self-pay

## 2014-09-14 ENCOUNTER — Inpatient Hospital Stay (HOSPITAL_COMMUNITY): Payer: Self-pay | Admitting: Speech Pathology

## 2014-09-14 DIAGNOSIS — R110 Nausea: Secondary | ICD-10-CM

## 2014-09-14 DIAGNOSIS — G8190 Hemiplegia, unspecified affecting unspecified side: Secondary | ICD-10-CM

## 2014-09-14 LAB — CBC WITH DIFFERENTIAL/PLATELET
BASOS PCT: 0 % (ref 0–1)
Basophils Absolute: 0 10*3/uL (ref 0.0–0.1)
Eosinophils Absolute: 0.4 10*3/uL (ref 0.0–0.7)
Eosinophils Relative: 4 % (ref 0–5)
HEMATOCRIT: 36.6 % (ref 36.0–46.0)
Hemoglobin: 11.7 g/dL — ABNORMAL LOW (ref 12.0–15.0)
LYMPHS ABS: 3.7 10*3/uL (ref 0.7–4.0)
Lymphocytes Relative: 36 % (ref 12–46)
MCH: 23 pg — ABNORMAL LOW (ref 26.0–34.0)
MCHC: 32 g/dL (ref 30.0–36.0)
MCV: 71.9 fL — ABNORMAL LOW (ref 78.0–100.0)
MONOS PCT: 11 % (ref 3–12)
Monocytes Absolute: 1.1 10*3/uL — ABNORMAL HIGH (ref 0.1–1.0)
Neutro Abs: 5 10*3/uL (ref 1.7–7.7)
Neutrophils Relative %: 49 % (ref 43–77)
Platelets: 349 10*3/uL (ref 150–400)
RBC: 5.09 MIL/uL (ref 3.87–5.11)
RDW: 15.5 % (ref 11.5–15.5)
WBC: 10.1 10*3/uL (ref 4.0–10.5)

## 2014-09-14 LAB — COMPREHENSIVE METABOLIC PANEL
ALT: 20 U/L (ref 0–35)
AST: 19 U/L (ref 0–37)
Albumin: 2.8 g/dL — ABNORMAL LOW (ref 3.5–5.2)
Alkaline Phosphatase: 70 U/L (ref 39–117)
Anion gap: 3 — ABNORMAL LOW (ref 5–15)
BILIRUBIN TOTAL: 0.6 mg/dL (ref 0.3–1.2)
BUN: 14 mg/dL (ref 6–23)
CHLORIDE: 99 meq/L (ref 96–112)
CO2: 31 mmol/L (ref 19–32)
CREATININE: 1.13 mg/dL — AB (ref 0.50–1.10)
Calcium: 9.2 mg/dL (ref 8.4–10.5)
GFR, EST AFRICAN AMERICAN: 68 mL/min — AB (ref >90)
GFR, EST NON AFRICAN AMERICAN: 59 mL/min — AB (ref >90)
GLUCOSE: 183 mg/dL — AB (ref 70–99)
Potassium: 3.8 mmol/L (ref 3.5–5.1)
Sodium: 133 mmol/L — ABNORMAL LOW (ref 135–145)
Total Protein: 6.7 g/dL (ref 6.0–8.3)

## 2014-09-14 LAB — GLUCOSE, CAPILLARY
GLUCOSE-CAPILLARY: 158 mg/dL — AB (ref 70–99)
GLUCOSE-CAPILLARY: 191 mg/dL — AB (ref 70–99)
GLUCOSE-CAPILLARY: 221 mg/dL — AB (ref 70–99)
Glucose-Capillary: 209 mg/dL — ABNORMAL HIGH (ref 70–99)

## 2014-09-14 MED ORDER — ALPRAZOLAM 0.25 MG PO TABS
0.5000 mg | ORAL_TABLET | Freq: Once | ORAL | Status: AC
  Administered 2014-09-14: 0.5 mg via ORAL
  Filled 2014-09-14: qty 2

## 2014-09-14 NOTE — Progress Notes (Signed)
44 y.o. right handed female with history of hypertension, diabetes mellitus and peripheral neuropathy, polysubstance abuse and medical noncompliance due to financial hardship. Patient recently homeless and has been living with friends recently. Presented 09/08/2014 with left-sided weakness and slurred speech. Patient with recent admission 08/28/2014 for chest pain and elevated blood pressure with negative troponins urine drug screen was positive at that time for marijuana workup unremarkable. MRI of the brain showed small acute infarct posterior limb of the right internal capsule and corona radiata. MRA without major occlusion or stenosis. Recent echocardiogram with ejection fraction of 65% left ventricular diastolic dysfunction. Carotid Dopplers with no ICA stenosis. Patient did not receive TPA. Neurology services consulted maintained on aspirin for CVA prophylaxis   Subjective/Complaints: Pt states she has no family who can help her post d/c No permanent home Pt denies pain, tolerating therapy  Review of Systems - Negative except occ back pain  Objective: Vital Signs: Blood pressure 134/88, pulse 77, temperature 98.2 F (36.8 C), temperature source Oral, resp. rate 18, last menstrual period 08/28/2014, SpO2 98 %. No results found. Results for orders placed or performed during the hospital encounter of 09/11/14 (from the past 72 hour(s))  Glucose, capillary     Status: Abnormal   Collection Time: 09/11/14  4:16 PM  Result Value Ref Range   Glucose-Capillary 207 (H) 70 - 99 mg/dL  CBC     Status: Abnormal   Collection Time: 09/11/14  5:15 PM  Result Value Ref Range   WBC 9.5 4.0 - 10.5 K/uL   RBC 5.39 (H) 3.87 - 5.11 MIL/uL   Hemoglobin 12.7 12.0 - 15.0 g/dL   HCT 38.8 36.0 - 46.0 %   MCV 72.0 (L) 78.0 - 100.0 fL   MCH 23.6 (L) 26.0 - 34.0 pg   MCHC 32.7 30.0 - 36.0 g/dL   RDW 15.6 (H) 11.5 - 15.5 %   Platelets 380 150 - 400 K/uL  Creatinine, serum     Status: Abnormal   Collection  Time: 09/11/14  5:15 PM  Result Value Ref Range   Creatinine, Ser 1.05 0.50 - 1.10 mg/dL   GFR calc non Af Amer 65 (L) >90 mL/min   GFR calc Af Amer 75 (L) >90 mL/min    Comment: (NOTE) The eGFR has been calculated using the CKD EPI equation. This calculation has not been validated in all clinical situations. eGFR's persistently <90 mL/min signify possible Chronic Kidney Disease.   Glucose, capillary     Status: Abnormal   Collection Time: 09/11/14  9:03 PM  Result Value Ref Range   Glucose-Capillary 272 (H) 70 - 99 mg/dL  Glucose, capillary     Status: Abnormal   Collection Time: 09/12/14  7:01 AM  Result Value Ref Range   Glucose-Capillary 172 (H) 70 - 99 mg/dL  Glucose, capillary     Status: Abnormal   Collection Time: 09/12/14 11:10 AM  Result Value Ref Range   Glucose-Capillary 186 (H) 70 - 99 mg/dL  Glucose, capillary     Status: Abnormal   Collection Time: 09/12/14  4:58 PM  Result Value Ref Range   Glucose-Capillary 176 (H) 70 - 99 mg/dL  Glucose, capillary     Status: Abnormal   Collection Time: 09/12/14  8:26 PM  Result Value Ref Range   Glucose-Capillary 274 (H) 70 - 99 mg/dL  Glucose, capillary     Status: Abnormal   Collection Time: 09/13/14  6:13 AM  Result Value Ref Range   Glucose-Capillary 178 (H)  70 - 99 mg/dL  Glucose, capillary     Status: Abnormal   Collection Time: 09/13/14 11:56 AM  Result Value Ref Range   Glucose-Capillary 159 (H) 70 - 99 mg/dL  Glucose, capillary     Status: Abnormal   Collection Time: 09/13/14  4:04 PM  Result Value Ref Range   Glucose-Capillary 218 (H) 70 - 99 mg/dL  Glucose, capillary     Status: Abnormal   Collection Time: 09/13/14  8:36 PM  Result Value Ref Range   Glucose-Capillary 199 (H) 70 - 99 mg/dL  CBC WITH DIFFERENTIAL     Status: Abnormal   Collection Time: 09/14/14  6:13 AM  Result Value Ref Range   WBC 10.1 4.0 - 10.5 K/uL   RBC 5.09 3.87 - 5.11 MIL/uL   Hemoglobin 11.7 (L) 12.0 - 15.0 g/dL   HCT 36.6 36.0  - 46.0 %   MCV 71.9 (L) 78.0 - 100.0 fL   MCH 23.0 (L) 26.0 - 34.0 pg   MCHC 32.0 30.0 - 36.0 g/dL   RDW 15.5 11.5 - 15.5 %   Platelets 349 150 - 400 K/uL   Neutrophils Relative % 49 43 - 77 %   Neutro Abs 5.0 1.7 - 7.7 K/uL   Lymphocytes Relative 36 12 - 46 %   Lymphs Abs 3.7 0.7 - 4.0 K/uL   Monocytes Relative 11 3 - 12 %   Monocytes Absolute 1.1 (H) 0.1 - 1.0 K/uL   Eosinophils Relative 4 0 - 5 %   Eosinophils Absolute 0.4 0.0 - 0.7 K/uL   Basophils Relative 0 0 - 1 %   Basophils Absolute 0.0 0.0 - 0.1 K/uL  Glucose, capillary     Status: Abnormal   Collection Time: 09/14/14  6:43 AM  Result Value Ref Range   Glucose-Capillary 158 (H) 70 - 99 mg/dL     HEENT: normal Cardio: RRR and no murmur Resp: CTA B/L and unlabored GI: BS positive and NT, ND Extremity:  Pulses positive and No Edema Skin:   Intact Neuro: Alert/Oriented, Cranial Nerve II-XII normal, Normal Sensory and Abnormal Motor 3- Left delt bi tri grip, HF, KE 2- ankle DF Musc/Skel:  Normal Gen NAD   Assessment/Plan: 1. Functional deficits secondary to Left hemi secondary to Right Int Capsule infarct which require 3+ hours per day of interdisciplinary therapy in a comprehensive inpatient rehab setting. Physiatrist is providing close team supervision and 24 hour management of active medical problems listed below. Physiatrist and rehab team continue to assess barriers to discharge/monitor patient progress toward functional and medical goals. Given hx of noncompliance, substance abuse and poor social situation high risk for recurrent admissions for medical issues, will need to coordinate d/c plans with all resources possible FIM: FIM - Bathing Bathing Steps Patient Completed: Chest, Right Arm, Left Arm, Abdomen, Front perineal area, Buttocks, Right upper leg, Left upper leg Bathing: 4: Min-Patient completes 8-9 [id number] parts or 75+ percent  FIM - Upper Body Dressing/Undressing Upper body dressing/undressing: 0: Wears  gown/pajamas-no public clothing FIM - Lower Body Dressing/Undressing Lower body dressing/undressing steps patient completed: Don/Doff right sock Lower body dressing/undressing: 2: Max-Patient completed 25-49% of tasks  FIM - Toileting Toileting steps completed by patient: Performs perineal hygiene, Adjust clothing after toileting, Adjust clothing prior to toileting Toileting Assistive Devices: Grab bar or rail for support Toileting: 4: Steadying assist  FIM - TRadio producerDevices: Grab bars Toilet Transfers: 4-To toilet/BSC: Min A (steadying Pt. > 75%), 4-From toilet/BSC:  Min A (steadying Pt. > 75%)  FIM - Bed/Chair Transfer Bed/Chair Transfer Assistive Devices: Bed rails, HOB elevated Bed/Chair Transfer: 5: Supine > Sit: Supervision (verbal cues/safety issues), 4: Bed > Chair or W/C: Min A (steadying Pt. > 75%)  FIM - Locomotion: Wheelchair Distance: 200 Locomotion: Wheelchair: 1: Total Assistance/staff pushes wheelchair (Pt<25%) FIM - Locomotion: Ambulation Locomotion: Ambulation Assistive Devices: Administrator Ambulation/Gait Assistance: 4: Min guard, 4: Min assist Locomotion: Ambulation: 2: Travels 50 - 149 ft with minimal assistance (Pt.>75%)  Comprehension Comprehension Mode: Auditory Comprehension: 7-Follows complex conversation/direction: With no assist  Expression Expression Mode: Verbal Expression: 7-Expresses complex ideas: With no assist  Social Interaction Social Interaction: 6-Interacts appropriately with others with medication or extra time (anti-anxiety, antidepressant).  Problem Solving Problem Solving: 6-Solves complex problems: With extra time  Memory Memory: 7-Complete Independence: No helper  Medical Problem List and Plan: 1. Functional deficits secondary to Right PLIC infarct/CR infarct 2.  DVT Prophylaxis/Anticoagulation: Subcutaneous heparin. Monitor platelet counts or any signs of bleeding 3. Pain Management:  Tylenol as needed 4. Dysphagia. Mechanical soft diet. Follow-up speech therapy 5. Neuropsych: This patient is capable of making decisions on her own behalf. 6. Skin/Wound Care: Routine skin checks 7. Fluids/Electrolytes/Nutrition: Strict I and O follow-up chemistries 8. Hypertension. Norvasc 10 mg daily, hydrochlorothiazide 25 mg daily, lisinopril 20 mg daily. Monitor for increased mobility 9. Diabetes mellitus with peripheral neuropathy. Hemoglobin A1c 10.3. Currently with sliding scale insulin. Patient on Glucotrol 10 mg twice a day and Glucophage 1000 mg twice a day prior to admission but was not taking due to medical compliance and financial constraints. She will need full diabetic teaching 10. Hyperlipidemia. Lipitor 11. Medical noncompliance. Documentation of medical noncompliance and financial constraints prior to admission. Provide counseling and social work follow-up 12. History of polysubstance abuse. Counseling  LOS (Days) 3 A FACE TO FACE EVALUATION WAS PERFORMED  Ivey Nembhard E 09/14/2014, 7:00 AM

## 2014-09-14 NOTE — IPOC Note (Signed)
Overall Plan of Care [facility]) Patient Details Name: Cristabel Bicknell MRN: [id number] DOB: 04/01/1971  Admitting Diagnosis: RT CVA  Hospital Problems: Principal Problem:   CVA (cerebral infarction) Active Problems:   Morbid obesity with BMI of 45.0-49.9, adult   Type 2 diabetes mellitus with complication   Nausea without vomiting     Functional Problem List: Nursing Endurance, Medication Management, Pain, Safety  PT Balance, Edema, Motor, Safety, Sensory  OT Balance, Endurance, Motor, Sensory  SLP    TR         Basic ADL's: OT Grooming, Bathing, Dressing, Toileting, Eating     Advanced  ADL's: OT Simple Meal Preparation, Laundry     Transfers: PT Bed Mobility, Bed to Chair, Car, Manufacturing systems engineer, Metallurgist: PT Ambulation, Emergency planning/management officer, Stairs     Additional Impairments: OT Fuctional Use of Upper Extremity  SLP Swallowing      TR      Anticipated Outcomes Item Anticipated Outcome  Self Feeding Mod I  Swallowing  Independent   Basic self-care  Mod I  Toileting  Mod I   Bathroom Transfers Mod I  Bowel/Bladder  n/a , continent of bowel and bladder  Transfers  Mod I  Locomotion  Mod I ambulation  Communication  Mod I  Cognition  Independent  Pain  3 or less on scale of 1-10, hx of chronic low back pain  Safety/Judgment  supervision   Therapy Plan: PT Intensity: Minimum of 1-2 x/day ,45 to 90 minutes PT Frequency: 5 out of 7 days PT Duration Estimated Length of Stay: 10-14 OT Intensity: Minimum of 1-2 x/day, 45 to 90 minutes OT Frequency: 5 out of 7 days OT Duration/Estimated Length of Stay: 10-14 days SLP Intensity: Minumum of 1-2 x/day, 30 to 90 minutes SLP Frequency: 5 out of 7 days SLP Duration/Estimated Length of Stay: 5-10 days       Team Interventions: Nursing Interventions Patient/Family Education, Disease Management/Prevention, Pain Management, Medication Management, Discharge Planning, Psychosocial Support   PT interventions Ambulation/gait training, Discharge planning, Functional mobility training, Psychosocial support, Therapeutic Activities, Balance/vestibular training, Disease management/prevention, Neuromuscular re-education, Therapeutic Exercise, Wheelchair propulsion/positioning, UE/LE Strength taining/ROM, Splinting/orthotics, Pain management, DME/adaptive equipment instruction, Cognitive remediation/compensation, Community reintegration, Technical sales engineer stimulation, Patient/family education, IT trainer, UE/LE Coordination activities  OT Interventions Training and development officer, Discharge planning, Community reintegration, Disease mangement/prevention, Engineer, drilling, Functional mobility training, Neuromuscular re-education, Pain management, Patient/family education, Psychosocial support, Self Care/advanced ADL retraining, Therapeutic Activities, Therapeutic Exercise, UE/LE Strength taining/ROM, UE/LE Coordination activities  SLP Interventions Cueing hierarchy, Functional tasks, Therapeutic Activities, Internal/external aids, Oral motor exercises, Patient/family education, Dysphagia/aspiration precaution training  TR Interventions    SW/CM Interventions      Team Discharge Planning: Destination: PT-Home (tbd) ,OT- Home , SLP-Home Projected Follow-up: PT-Outpatient PT, OT-  Outpatient OT, SLP-Skilled Nursing facility Projected Equipment Needs: PT-To be determined, OT- Tub/shower bench, SLP-None recommended by SLP Equipment Details: PT- , OT-  Patient/family involved in discharge planning: PT- Patient,  OT-Patient, SLP-Patient  MD ELOS: 10-12d Medical Rehab Prognosis:  Good Assessment: 44 y.o. right handed female with history of hypertension, diabetes mellitus and peripheral neuropathy, polysubstance abuse and medical noncompliance due to financial hardship. Patient recently homeless and has been living with friends recently. Presented 09/08/2014 with left-sided  weakness and slurred speech. Patient with recent admission 08/28/2014 for chest pain and elevated blood pressure with negative troponins urine drug screen was positive at that time for marijuana workup unremarkable. MRI of the brain showed  small acute infarct posterior limb of the right internal capsule and corona radiata. MRA without major occlusion or stenosis. Recent echocardiogram with ejection fraction of 65% left ventricular diastolic dysfunction. Carotid Dopplers with no ICA stenosis. Patient did not receive TPA. Neurology services consulted maintained on aspirin for CVA prophylaxis as well as subcutaneous heparin for DVT prophylaxis.    Now requiring 24/7 Rehab RN,MD, as well as CIR level PT, OT and SLP.  Treatment team will focus on ADLs and mobility with goals set at Mod I See Team Conference Notes for weekly updates to the plan of care

## 2014-09-14 NOTE — Progress Notes (Signed)
Speech Language Pathology Daily Session Note  Patient Details  Name: Diamond Rice MRN: [id number] Date of Birth: 06-Apr-1971  Today's Date: 09/14/2014 SLP Individual Time: 1410-1440 SLP Individual Time Calculation (min): 30 min  Short Term Goals: Week 1: SLP Short Term Goal 1 (Week 1): Patient will tolerate trials of advanced consistencies in order to upgrade diet, min A SLP Short Term Goal 2 (Week 1): Pt will target L-sided weakness by completing oral motor exercises with use of visual aide, min A.  SLP Short Term Goal 3 (Week 1): Pt will improve speech intelligibility at the phrase level to ~90% accuracy with mod cues for use of dysarthria strategies.   Skilled Therapeutic Interventions: Skilled treatment session focused on addressing recall and carryover of dysphagia and dysarthria education as well as effective compensatory strategies that were discussed and handouts were created for in first SLP session.  Patient required Min question cues to teach back information with no recall or attempts to utilize external aids.  Patient able to use aids Mod I after SLP identified them for her.  Continue with current plan of care.    FIM:  Comprehension Comprehension Mode: Auditory Comprehension: 6-Follows complex conversation/direction: With extra time/assistive device Expression Expression Mode: Verbal Expression: 5-Expresses basic 90% of the time/requires cueing < 10% of the time. Social Interaction Social Interaction: 6-Interacts appropriately with others with medication or extra time (anti-anxiety, antidepressant). Problem Solving Problem Solving: 5-Solves complex 90% of the time/cues < 10% of the time Memory Memory: 4-Recognizes or recalls 75 - 89% of the time/requires cueing 10 - 24% of the time  Pain Pain Assessment Pain Assessment: No/denies pain  Therapy/Group: Individual Therapy  Carmelia Roller., [place] [phone]  [place] 09/14/2014, 5:02 PM

## 2014-09-14 NOTE — Progress Notes (Signed)
Physical Therapy Session Note  Patient Details  Name: Diamond Rice MRN: [id number] Date of Birth: 1971/03/08  Today's Date: 09/14/2014 PT Individual Time: 8403-7543 PT Individual Time Calculation (min): 45 min   Short Term Goals: Week 1:  PT Short Term Goal 1 (Week 1): Pt will demonstrate transfers w/c to/from bed req SBA.  PT Short Term Goal 2 (Week 1): Pt will ambulate >= 150' with RW req SBA. PT Short Term Goal 3 (Week 1): Pt will ascend/descend >= 5 stairs with 1 or 2 rails req SBA. PT Short Term Goal 4 (Week 1): Pt will demonstrate mod I bed mobility.  PT Short Term Goal 5 (Week 1): Pt will demonstrate car transfer with CGA.   Skilled Therapeutic Interventions/Progress Updates:    Pt receieved semi reclined in bed; agreeable to therapy. Pt performed supine > sit with min A, HOB flat, no rails; multimodal cueing for sequencing, technique. Pt ambulated 2x120' in controlled and home environments with rolling walker and min A. During ambulation, decreased weight shift to L side, decreased L ankle dorsiflexion during LLE advancement/heel strike, and L genu recurvatum during LLE midstance. L genu recurvatum effectively corrected with placement of heel lift in L shoe. Pt report of discomfort in R shoulder resolved with modification of rolling walker to youth height. Added L hand orthosis to rolling walker due to decreased grip strength on L hand. In ADL apartment, pt performed sit<>stand transfers from couch and bed with supervision to min A, tactile cueing at L knee, L hand for increased weightbearing. With supine<>sit in rehab apartment bed, pt required supervision and only subtle cueing with effective within-session carryover of >75% of cueing described above. Session ended in pt room, where pt was left seated in w/c with all needs within reach.  Therapy Documentation Precautions:  Precautions Precautions: Fall Precaution Comments: L hemiparesis Restrictions Weight Bearing Restrictions:  No Pain: Pain Assessment Pain Assessment: No/denies pain Pain Score: 0-No pain Locomotion : Ambulation Ambulation/Gait Assistance: 4: Min guard;4: Min assist   See FIM for current functional status  Therapy/Group: Individual Therapy  [place], Malva Cogan 09/14/2014, 9:34 AM

## 2014-09-14 NOTE — Progress Notes (Signed)
Occupational Therapy Session Note  Patient Details  Name: Diamond Rice MRN: [id number] Date of Birth: 1970/11/27  Today's Date: 09/14/2014 OT Individual Time: 1300-1400 OT Individual Time Calculation (min): 60 min    Short Term Goals: Week 1:  OT Short Term Goal 1 (Week 1): Pt will complete bathing with supervision at sit > stand level OT Short Term Goal 2 (Week 1): Pt will complete toilet transfers with supervision with LRAD OT Short Term Goal 3 (Week 1): Pt will complete LB dressing wtih min assist OT Short Term Goal 4 (Week 1): Pt will utilize LUE as gross assist with self-feeding tasks with min verbal cues  Skilled Therapeutic Interventions/Progress Updates:    Pt seen for ADL retraining with focus on functional transfers, standing balance, safety awareness, and attention to LUE. Pt received sitting in w/c. Ambulated to bathroom at min A using RW. Completed bathing at overall min A with hand over hand assist to wash RUE. Completed dressing from w/c (donning pants only as pt did not have shirt). Pt required min A for standing balance and assist to manage clothing over left hip. Donned socks while propping feet on chair with pt able to don L sock 50%. Completed hair hygiene with shower cap and therapist assisting to incorporate LUE into task. Pt required mod cues for positioning of LUE during therapy session. Pt left sitting in w/c with all needs in reach.  Therapy Documentation Precautions:  Precautions Precautions: Fall Precaution Comments: L hemiparesis Restrictions Weight Bearing Restrictions: No General:   Vital Signs:   Pain: Pain Assessment Pain Assessment: No/denies pain  See FIM for current functional status  Therapy/Group: Individual Therapy  Duayne Cal 09/14/2014, 1:57 PM

## 2014-09-14 NOTE — Progress Notes (Signed)
Speech Language Pathology Daily Session Note  Patient Details  Name: Diamond Rice MRN: [id number] Date of Birth: October 27, 1970  Today's Date: 09/14/2014 SLP Individual Time: 1025-8527 SLP Individual Time Calculation (min): 45 min  Short Term Goals: Week 1: SLP Short Term Goal 1 (Week 1): Patient will tolerate trials of advanced consistencies in order to upgrade diet, min A SLP Short Term Goal 2 (Week 1): Pt will target L-sided weakness by completing oral motor exercises with use of visual aide, min A.  SLP Short Term Goal 3 (Week 1): Pt will improve speech intelligibility at the phrase level to ~90% accuracy with mod cues for use of dysarthria strategies.   Skilled Therapeutic Interventions:  Pt was seen for skilled ST targeting goals for dysphagia and speech intelligibility.  Upon arrival, pt was seated upright in the wheelchair, awake, alert, and agreeable to participate in ST.  SLP facilitated the session with skilled education related to compensatory strategies to maximize swallowing safety with presentations of her currently prescribed diet, including taking small bites/sips and alternating solids and liquids per pt reports of globus sensation with solids.  SLP also provided skilled dysarthria strategies and vocal hygiene techniques given pt's acute changes in speech intelligibility and vocal quality s/p CVA.  Education included handouts of compensatory strategies and oral motor exercises targeted to improve left sided labial, lingual, and buccal weakness.  SLP introduced techniques of resonant voice therapy to reduce strained, harsh vocal quality in a structured context.  Continue per current plan of care.    FIM:  Comprehension Comprehension Mode: Auditory Comprehension: 6-Follows complex conversation/direction: With extra time/assistive device Expression Expression Mode: Verbal Expression: 5-Expresses basic 90% of the time/requires cueing < 10% of the time. Social Interaction Social  Interaction: 6-Interacts appropriately with others with medication or extra time (anti-anxiety, antidepressant). Problem Solving Problem Solving: 5-Solves complex 90% of the time/cues < 10% of the time Memory Memory: 6-More than reasonable amt of time FIM - Eating Eating Activity: 6: Modified consistency diet: (comment);6: More than reasonable amount of time;5: Set-up assist for open containers  Pain Pain Assessment Pain Assessment: No/denies pain Pain Score: 0-No pain  Therapy/Group: Individual Therapy  Roshaunda Starkey, Selinda Orion 09/14/2014, 12:53 PM

## 2014-09-14 NOTE — Progress Notes (Signed)
[facility] Individual Statement of Services  Patient Name:  Diamond Rice  Date:  09/14/2014  Welcome to the [facility].  Our goal is to provide you with an individualized program based on your diagnosis and situation, designed to meet your specific needs.  With this comprehensive rehabilitation program, you will be expected to participate in at least 3 hours of rehabilitation therapies Monday-Friday, with modified therapy programming on the weekends.  Your rehabilitation program will include the following services:  Physical Therapy (PT), Occupational Therapy (OT), Speech Therapy (ST), 24 hour per day rehabilitation nursing, Neuropsychology, Case Management (Social Worker), Rehabilitation Medicine, Nutrition Services and Pharmacy Services  Weekly team conferences will be held on Wednesdays to discuss your progress.  Your Social Worker will talk with you frequently to get your input and to update you on team discussions.  Team conferences with you and your family in attendance may also be held.  Expected length of stay:  10 - 14 days  Overall anticipated outcome:  Modified Independent  Depending on your progress and recovery, your program may change. Your Social Worker will coordinate services and will keep you informed of any changes. Your Social Worker's name and contact numbers are listed  below.  The following services may also be recommended but are not provided by the [facility] will be made to provide these services after discharge if needed.  Arrangements include referral to agencies that provide these services.  Your insurance has been verified to be:  Applying for Medicaid while at [facility] primary doctor is:  [facility]  Pertinent information will be shared with your doctor and your insurance company.  Social Worker:  Alfonse Alpers, LCSW  [phone] or [phone]  Information discussed with and copy given to patient by: Trey Sailors, 09/14/2014, 12:25 PM

## 2014-09-15 ENCOUNTER — Inpatient Hospital Stay (HOSPITAL_COMMUNITY): Payer: MEDICAID | Admitting: Physical Therapy

## 2014-09-15 ENCOUNTER — Inpatient Hospital Stay (HOSPITAL_COMMUNITY): Payer: MEDICAID | Admitting: Occupational Therapy

## 2014-09-15 ENCOUNTER — Inpatient Hospital Stay (HOSPITAL_COMMUNITY): Payer: Self-pay | Admitting: Speech Pathology

## 2014-09-15 DIAGNOSIS — G8194 Hemiplegia, unspecified affecting left nondominant side: Secondary | ICD-10-CM

## 2014-09-15 DIAGNOSIS — I6381 Other cerebral infarction due to occlusion or stenosis of small artery: Secondary | ICD-10-CM | POA: Diagnosis present

## 2014-09-15 LAB — GLUCOSE, CAPILLARY
GLUCOSE-CAPILLARY: 187 mg/dL — AB (ref 70–99)
Glucose-Capillary: 144 mg/dL — ABNORMAL HIGH (ref 70–99)
Glucose-Capillary: 85 mg/dL (ref 70–99)
Glucose-Capillary: 213 mg/dL — ABNORMAL HIGH (ref 70–99)

## 2014-09-15 MED ORDER — LIVING WELL WITH DIABETES BOOK
Freq: Once | Status: AC
  Administered 2014-09-15: 15:00:00
  Filled 2014-09-15: qty 1

## 2014-09-15 MED ORDER — GLIMEPIRIDE 2 MG PO TABS
2.0000 mg | ORAL_TABLET | Freq: Every day | ORAL | Status: DC
  Administered 2014-09-15 – 2014-09-16 (×2): 2 mg via ORAL
  Filled 2014-09-15 (×3): qty 1

## 2014-09-15 NOTE — Progress Notes (Signed)
Patient information reviewed and entered into eRehab system by Nole Robey, RN, CRRN, PPS Coordinator.  Information including medical coding and functional independence measure will be reviewed and updated through discharge.    

## 2014-09-15 NOTE — Progress Notes (Signed)
Social Work Assessment and Plan  Patient Details  Name: Diamond Rice MRN: [id number] Date of Birth: 1971-07-03  Today's Date: 09/15/2014  Problem List:  Patient Active Problem List   Diagnosis Date Noted  . Right-sided lacunar infarction 09/15/2014  . Left hemiparesis 09/15/2014  . Nausea without vomiting   . CVA (cerebral infarction) 09/11/2014  . Dyspnea   . CVA (cerebral vascular accident) 09/08/2014  . Pain in the chest   . Essential hypertension   . Type 2 diabetes mellitus with complication   . Back pain 09/11/2013  . Psychoactive substance-induced organic mood disorder 05/28/2013  . Cocaine abuse with cocaine-induced mood disorder 05/28/2013  . Cannabis dependence with cannabis-induced anxiety disorder 05/28/2013  . Morbid obesity with BMI of 45.0-49.9, adult 04/25/2012  . Chest pain 04/17/2012  . Hypertension 04/17/2012  . DM (diabetes mellitus), type 2, uncontrolled 04/17/2012  . Hyperlipidemia 04/17/2012  . Tobacco use 04/17/2012   Past Medical History:  Past Medical History  Diagnosis Date  . Hypertension   . Type II diabetes mellitus   . Anginal pain   . High cholesterol   . Shortness of breath 04/17/2012    "all the time"  . Headache(784.0) 04/17/2012    "~ qod; lately waking up in am w/one"  . Migraines 04/17/2012  . Chronic lower back pain 04/17/2012    "just got over some; catched when I walked"  . Depression   . Anxiety   . Sleep apnea   . Obesity    Past Surgical History:  Past Surgical History  Procedure Laterality Date  . Cardiac catheterization  ~ 2011   Social History:  reports that she quit smoking about 13 months ago. Her smoking use included Cigarettes. She has a .8 pack-year smoking history. She has never used smokeless tobacco. She reports that she drinks alcohol. She reports that she does not use illicit drugs.  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  It is temporary, but she can return to friend's house.  It seems that the friend may help somewhat, but pt is counting on being able to do things for herself at a mod I level. Caregiver Availability: Intermittent Family Dynamics: Pt's siblings live in [place] area.  She cannot count on them for support, as they have really not talked much since their mother's death a few years ago.  Social History Preferred language: English Religion: Apostolic Education: CNA certification Read: Yes Write: Yes Employment Status: Employed Name of Employer: Theatre stage manager of Employment: 1 Return to Work Plans: Pt would like to work as she is able, but she realizes that she will not be able to work as a Quarry manager after having a stroke. Legal History/Current Legal Issues: none reported Guardian/Conservator: N/A   Abuse/Neglect Physical Abuse: Denies Verbal Abuse: Denies Sexual Abuse: Denies Exploitation of patient/patient's resources: Denies Self-Neglect: Denies  Emotional Status Pt's affect, behavior and adjustment status: Pt reports that her condition is "depressing", but she plans to surround herself with supports and knows she will make it through this.  She wants to do what she can to prevent herself from ever having to go through a stroke and reovery again. Recent Psychosocial Issues: Pt is without a permanent home and  is currently staying with a friend.  She is unsure how long this arrangement will work, as she cannot contrubute financially to the household. Psychiatric History: Pt has been hospitalized once for anxiety and depression.  Pt has taken medications in the past for anxiety, depression, and sleep disturbances. Substance Abuse History:  Pt has used drugs in the past and stated that she is "done with that" and reports she does not need any resources to stop drug use.  Patient / Family Perceptions, Expectations & Goals Pt/Family understanding of illness & functional limitations: Pt reports a good understanding of her condition and feels her questions have been answered. Premorbid pt/family roles/activities: Pt enjoys doing CNA work. Anticipated changes in roles/activities/participation: She is disappointed she will not be able to do this kind of work at this time. Pt/family expectations/goals: Pt's main goal is to keep this from happening again.  Community Resources Express Scripts: None Premorbid Home Care/DME Agencies: None Transportation available at discharge: friend Resource referrals recommended: Neuropsychology, Support group (specify)  Discharge Planning Living Arrangements: Non-relatives/Friends Support Systems: Friends/neighbors Type of Residence: Private residence Pensions consultant: Employment (though pt realizes she cannot work in the capacity of a CNA at this time.) Museum/gallery curator Screen Referred: Previously completed (Pt is applying for [employer] and disability.) Living Expenses: Other (Comment) (lives with friend-was trying to help out with expenses as she was able) Money Management: Patient Does the patient have any problems obtaining your medications?: Yes (Describe) (lack of financial resources) Home Management: Pt's friend will take care of this. Patient/Family Preliminary Plans: Pt plans to return to her friend's home where she was staying prior to hospitalization. Barriers to Discharge: Family Support, Finances, Other (Comment) (no permanent home) Social Work Anticipated Follow Up Needs: HH/OP, Support Group Expected length of stay: 10 - 14 days  Clinical Impression CSW met with pt to introduce self and role of CSW, as well at to complete assessment. Pt answered CSW's questions and finally smiled and  chuckled at the end of our visit, but was otherwise fairly flat during our visit.  She has a history of anxiety and depression, as well as sleep disturbances, but has not been taking any medications for it at this time due to lack of financial resources.  Pt was established with [facility] and she wishes to continue with them.  However, pt may need to go to the [facility] so that she can have assistance with her medications.  Pt is due to meet with Medicaid worker tomorrow at [facility] and will most likely apply for disability directly after that.   CSW has informed therapy team so that her schedule can be worked around these two appointments.  Pt feels she can initially return to her friend's home at d/c, but long term she is not sure how long she can stay.  She has very little family support from her siblings and has no children.  Pt is trying to surround herself with supportive people and is committed to not having another stroke and wants to do whatever she can to prevent that.  Pt may be interested in SCAT application.  Pt has been given shelter resources and the [facility] information by previous CSWs.  This CSW will continue to follow pt and assist as needed.  Opie Maclaughlin, [name] 09/15/2014, 11:50 AM

## 2014-09-15 NOTE — Progress Notes (Addendum)
Inpatient Diabetes Program Recommendations  AACE/ADA: New Consensus Statement on Inpatient Glycemic Control (2013)  Target Ranges:  Prepandial:   less than 140 mg/dL      Peak postprandial:   less than 180 mg/dL (1-2 hours)      Critically ill patients:  140 - 180 mg/dL   Reason for Visit: consult received for poor compliance/control with diabetes. HgbA1C is elevted to 10.3% As recommended on 1/14,  According to ADA standards, A1C greater than 9% recommends using a basal/bolus approach to control.  Care management has noted that patient can to to [facility] for her medications and medical follow-up. Medicaid is also pending. Would be helpful to establish an effective regimen while here before discharge. Inpatient Diabetes Program Recommendations Insulin - Basal: lPt would benefit from addition of basl levemir/lantus of 15 units -.  Correction (SSI): Would continue the moderate correction tidwc . Insulin - Meal Coverage: (may need meal coverage once eating and/if correction alone does not control post-prandials Noted pt on Amaryl 2 mg and correction. Pt most probably will not produce enough insulin using an oral agent (typical starting dose is amaryl 4 mg daily Will order an dietician consult, ed'l videos on diabetes control and complications, diet, etc.  Will talk with patient when able; will need see a change in med regimen. MD, please add DM to active in-hospital problem list. Pt should follow up with [facility] clinic where she will get education as well. Thank you, Rosita Kea, RN, CNS,Diabetes Coordinator

## 2014-09-15 NOTE — Progress Notes (Signed)
Physical Therapy Session Note  Patient Details  Name: Diamond Rice MRN: [id number] Date of Birth: [birth year]  Today's Date: 09/15/2014 PT Individual Time: 0102-7253 PT Individual Time Calculation (min): 63 min   Short Term Goals: Week 1:  PT Short Term Goal 1 (Week 1): Pt will demonstrate transfers w/c to/from bed req SBA.  PT Short Term Goal 2 (Week 1): Pt will ambulate >= 150' with RW req SBA. PT Short Term Goal 3 (Week 1): Pt will ascend/descend >= 5 stairs with 1 or 2 rails req SBA. PT Short Term Goal 4 (Week 1): Pt will demonstrate mod I bed mobility.  PT Short Term Goal 5 (Week 1): Pt will demonstrate car transfer with CGA.   Skilled Therapeutic Interventions/Progress Updates:    Pt received seated in recliner; agreeable to therapy. Session focused on gait training and dynamic standing balance. Pt performed gait x100' in controlled environment with rolling walker and L hand orthosis at self-selected gait speed of .16 m/s. During initial gait trial, pt demonstrated the following gait deviations: limited lateral weight shift to L side, decreased L ankle dorsiflexion during LLE advancement, L genu recurvatum (GR) during LLE mid to terminal stance. In addition to heel wedge already in L shoe, ACE bandage at LLE effective in controlling L GR during gait x75' in as described above with cueing for decreased R step length, tactile cueing for LLE stance stability. During final ambulation trial, pt demonstrated active control of L GR without ACE bandage for initial 75'of trial; however, pt required hands-on assist to control L knee for remainder of trial.   Remainder of trial focused on completing Berg Balance Scale (BBS), on which pt score 31/56. See below for detailed findings. Educated pt on findings and implications with focus on score indication of high fall risk. Pt verbalized understanding. Session ended in pt room, where pt was left seated in recliner with all needs within reach.  Therapy  Documentation Precautions:  Precautions Precautions: Fall Precaution Comments: L hemiparesis Restrictions Weight Bearing Restrictions: No Pain: Pain Assessment Pain Assessment: No/denies pain Locomotion : Ambulation Ambulation/Gait Assistance: 4: Min guard  Balance: Balance Balance Assessed: Yes Standardized Balance Assessment Standardized Balance Assessment: Berg Balance Test Berg Balance Test Sit to Stand: Able to stand without using hands and stabilize independently Standing Unsupported: Able to stand 2 minutes with supervision Sitting with Back Unsupported but Feet Supported on Floor or Stool: Able to sit safely and securely 2 minutes Stand to Sit: Uses backs of legs against chair to control descent Transfers: Able to transfer with verbal cueing and /or supervision (supervision) Standing Unsupported with Eyes Closed: Able to stand 10 seconds with supervision Standing Ubsupported with Feet Together: Able to place feet together independently and stand for 1 minute with supervision From Standing, Reach Forward with Outstretched Arm: Can reach forward >5 cm safely (2") From Standing Position, Pick up Object from Floor: Unable to try/needs assist to keep balance From Standing Position, Turn to Look Behind Over each Shoulder: Looks behind one side only/other side shows less weight shift (Weight shift to R side > L side) Turn 360 Degrees: Needs close supervision or verbal cueing Standing Unsupported, Alternately Place Feet on Step/Stool: Able to complete >2 steps/needs minimal assist Standing Unsupported, One Foot in Front: Able to take small step independently and hold 30 seconds Standing on One Leg: Tries to lift leg/unable to hold 3 seconds but remains standing independently Total Score: 31  See FIM for current functional status  Therapy/Group: Individual Therapy  Diamond Rice 09/15/2014, 12:07 PM

## 2014-09-15 NOTE — Progress Notes (Signed)
Speech Language Pathology Daily Session Note  Patient Details  Name: Diamond Rice MRN: [id number] Date of Birth: [birth year]  Today's Date: 09/15/2014 SLP Individual Time: 1405-1505 SLP Individual Time Calculation (min): 60 min  Short Term Goals: Week 1: SLP Short Term Goal 1 (Week 1): Patient will tolerate trials of advanced consistencies in order to upgrade diet, min A SLP Short Term Goal 2 (Week 1): Pt will target L-sided weakness by completing oral motor exercises with use of visual aide, min A.  SLP Short Term Goal 3 (Week 1): Pt will improve speech intelligibility at the phrase level to ~90% accuracy with mod cues for use of dysarthria strategies.   Skilled Therapeutic Interventions:  Pt was seen for skilled ST targeting speech goals.  Upon arrival, pt was seated upright in wheelchair, awake, alert, and agreeable to participate in [facility].  Pt recalled at least 2 vocal hygiene techniques from yesterday's therapy session with supervision question cues.  SLP initiated skilled education related to diaphragmatic breathing techniques to improve breath support for speech as pt was noted with initial difficulty returning demonstration of yesterday's resonant voice therapy techniques.  Skilled education including basic information regarding normal vocal function to facilitate carryover of strategies and emphasize the relationship between breathing and phonation.  Following several minutes of deep breathing exercises, pt was able to achieve normal voicing at the word and phrase level.  Continue per current plan of care.    FIM:  Comprehension Comprehension Mode: Auditory Comprehension: 6-Follows complex conversation/direction: With extra time/assistive device Expression Expression Mode: Verbal Expression: 5-Expresses basic 90% of the time/requires cueing < 10% of the time. Social Interaction Social Interaction: 6-Interacts appropriately with others with medication or extra time (anti-anxiety,  antidepressant). Problem Solving Problem Solving: 5-Solves complex 90% of the time/cues < 10% of the time Memory Memory: 6-More than reasonable amt of time  Pain Pain Assessment Pain Assessment: No/denies pain  Therapy/Group: Individual Therapy  [name], Selinda Orion 09/15/2014, 4:47 PM

## 2014-09-15 NOTE — Progress Notes (Signed)
Occupational Therapy Session Note  Patient Details  Name: Diamond Rice MRN: [id number] Date of Birth: 04-01-1971  Today's Date: 09/15/2014 OT Individual Time: 0160-1093 OT Individual Time Calculation (min): 60 min    Short Term Goals: Week 1:  OT Short Term Goal 1 (Week 1): Pt will complete bathing with supervision at sit > stand level OT Short Term Goal 2 (Week 1): Pt will complete toilet transfers with supervision with LRAD OT Short Term Goal 3 (Week 1): Pt will complete LB dressing wtih min assist OT Short Term Goal 4 (Week 1): Pt will utilize LUE as gross assist with self-feeding tasks with min verbal cues  Skilled Therapeutic Interventions/Progress Updates:    Engaged in ADL retraining and LUE NMR with focus on increased use of LUE during functional tasks.  Pt refused bathing this session but completed dressing with setup assist, requiring assist to thread Lt pant leg and don Lt shoe.  Pt with c/o 10/10 pain in back, RN notified and medicated during session. Verbal cues throughout session to incorporate LUE into tasks.  Ambulated to sink to complete oral care, supervision with short distance ambulation with RW required assist to open containers and hand over hand to stabilize toothpaste in Lt hand while unscrewing cap.  Educated pt on HEP to promote wrist and finger function with handout of self-ROM and handout with grasp and finger mobility exercises.  Pt return demonstrated each exercise, left handouts with pt.  Pt with thumb opposition to 2nd and 3rd digits this session with increased time.  Therapy Documentation Precautions:  Precautions Precautions: Fall Precaution Comments: L hemiparesis Restrictions Weight Bearing Restrictions: No Pain: Pain Assessment Pain Assessment: 0-10 Pain Score: 4  Pain Type: Acute pain Pain Location: Back Pain Orientation: Lower Pain Descriptors / Indicators: Aching Pain Frequency: Intermittent Pain Onset: With Activity Patients Stated Pain  Goal: 3 Pain Intervention(s): Medication (See eMAR)  See FIM for current functional status  Therapy/Group: Individual Therapy  Simonne Come 09/15/2014, 9:27 AM

## 2014-09-15 NOTE — Plan of Care (Signed)
Problem: Food- and Nutrition-Related Knowledge Deficit (NB-1.1) Goal: Nutrition education Formal process to instruct or train a patient/client in a skill or to impart knowledge to help patients/clients voluntarily manage or modify food choices and eating behavior to maintain or improve health.  Outcome: Completed/Met Date Met:  09/15/14  RD consulted for nutrition education regarding diabetes.     Lab Results  Component Value Date    HGBA1C 10.3* 08/28/2014    RD provided "Carbohydrate Counting for People with Diabetes" handout from the Academy of [employer]. Discussed different food groups and their effects on blood sugar, emphasizing carbohydrate-containing foods. Provided list of carbohydrates and recommended serving sizes of common foods.  Discussed importance of controlled and consistent carbohydrate intake throughout the day. Recommended pt to consume around 4 serving of carbohydrates at each meal. Provided examples of ways to balance meals/snacks and encouraged intake of high-fiber, whole grain complex carbohydrates. Encouraged adequate protein intake. Discussed diabetic friendly drink options. Encouraged pt to start checking her blood sugar at home. Reviewed normal blood glucose range at fasting. Teach back method used.  Expect good compliance.  Body Mass Index: 43 kg/(m^2). Pt meets criteria for morbid obesity based on current BMI.  Current diet order is dysphagia 3 carbohydrate modified, patient is consuming approximately 75-100% of meals at this time. Labs and medications reviewed. No further nutrition interventions warranted at this time. RD contact information provided. If additional nutrition issues arise, please re-consult RD.  Kallie Locks, MS, RD, LDN Pager # [phone] After hours/ weekend pager # [phone]

## 2014-09-15 NOTE — Progress Notes (Signed)
44 y.o. right handed female with history of hypertension, diabetes mellitus and peripheral neuropathy, polysubstance abuse and medical noncompliance due to financial hardship. Patient recently homeless and has been living with friends recently. Presented 09/08/2014 with left-sided weakness and slurred speech. Patient with recent admission 08/28/2014 for chest pain and elevated blood pressure with negative troponins urine drug screen was positive at that time for marijuana workup unremarkable. MRI of the brain showed small acute infarct posterior limb of the right internal capsule and corona radiata. MRA without major occlusion or stenosis. Recent echocardiogram with ejection fraction of 65% left ventricular diastolic dysfunction. Carotid Dopplers with no ICA stenosis. Patient did not receive TPA. Neurology services consulted maintained on aspirin for CVA prophylaxis   Subjective/Complaints:  Pt denies pain, tolerating therapy  Review of Systems - Negative except occ back pain  Objective: Vital Signs: Blood pressure 129/79, pulse 84, temperature 98.1 F (36.7 C), temperature source Oral, resp. rate 16, last menstrual period 08/28/2014, SpO2 97 %. No results found. Results for orders placed or performed during the hospital encounter of 09/11/14 (from the past 72 hour(s))  Glucose, capillary     Status: Abnormal   Collection Time: 09/12/14  7:01 AM  Result Value Ref Range   Glucose-Capillary 172 (H) 70 - 99 mg/dL  Glucose, capillary     Status: Abnormal   Collection Time: 09/12/14 11:10 AM  Result Value Ref Range   Glucose-Capillary 186 (H) 70 - 99 mg/dL  Glucose, capillary     Status: Abnormal   Collection Time: 09/12/14  4:58 PM  Result Value Ref Range   Glucose-Capillary 176 (H) 70 - 99 mg/dL  Glucose, capillary     Status: Abnormal   Collection Time: 09/12/14  8:26 PM  Result Value Ref Range   Glucose-Capillary 274 (H) 70 - 99 mg/dL  Glucose, capillary     Status: Abnormal    Collection Time: 09/13/14  6:13 AM  Result Value Ref Range   Glucose-Capillary 178 (H) 70 - 99 mg/dL  Glucose, capillary     Status: Abnormal   Collection Time: 09/13/14 11:56 AM  Result Value Ref Range   Glucose-Capillary 159 (H) 70 - 99 mg/dL  Glucose, capillary     Status: Abnormal   Collection Time: 09/13/14  4:04 PM  Result Value Ref Range   Glucose-Capillary 218 (H) 70 - 99 mg/dL  Glucose, capillary     Status: Abnormal   Collection Time: 09/13/14  8:36 PM  Result Value Ref Range   Glucose-Capillary 199 (H) 70 - 99 mg/dL  CBC WITH DIFFERENTIAL     Status: Abnormal   Collection Time: 09/14/14  6:13 AM  Result Value Ref Range   WBC 10.1 4.0 - 10.5 K/uL   RBC 5.09 3.87 - 5.11 MIL/uL   Hemoglobin 11.7 (L) 12.0 - 15.0 g/dL   HCT 36.6 36.0 - 46.0 %   MCV 71.9 (L) 78.0 - 100.0 fL   MCH 23.0 (L) 26.0 - 34.0 pg   MCHC 32.0 30.0 - 36.0 g/dL   RDW 15.5 11.5 - 15.5 %   Platelets 349 150 - 400 K/uL   Neutrophils Relative % 49 43 - 77 %   Neutro Abs 5.0 1.7 - 7.7 K/uL   Lymphocytes Relative 36 12 - 46 %   Lymphs Abs 3.7 0.7 - 4.0 K/uL   Monocytes Relative 11 3 - 12 %   Monocytes Absolute 1.1 (H) 0.1 - 1.0 K/uL   Eosinophils Relative 4 0 - 5 %  Eosinophils Absolute 0.4 0.0 - 0.7 K/uL   Basophils Relative 0 0 - 1 %   Basophils Absolute 0.0 0.0 - 0.1 K/uL  Comprehensive metabolic panel     Status: Abnormal   Collection Time: 09/14/14  6:13 AM  Result Value Ref Range   Sodium 133 (L) 135 - 145 mmol/L    Comment: Please note change in reference range.   Potassium 3.8 3.5 - 5.1 mmol/L    Comment: Please note change in reference range.   Chloride 99 96 - 112 mEq/L   CO2 31 19 - 32 mmol/L   Glucose, Bld 183 (H) 70 - 99 mg/dL   BUN 14 6 - 23 mg/dL   Creatinine, Ser 1.13 (H) 0.50 - 1.10 mg/dL   Calcium 9.2 8.4 - 10.5 mg/dL   Total Protein 6.7 6.0 - 8.3 g/dL   Albumin 2.8 (L) 3.5 - 5.2 g/dL   AST 19 0 - 37 U/L   ALT 20 0 - 35 U/L   Alkaline Phosphatase 70 39 - 117 U/L   Total  Bilirubin 0.6 0.3 - 1.2 mg/dL   GFR calc non Af Amer 59 (L) >90 mL/min   GFR calc Af Amer 68 (L) >90 mL/min    Comment: (NOTE) The eGFR has been calculated using the CKD EPI equation. This calculation has not been validated in all clinical situations. eGFR's persistently <90 mL/min signify possible Chronic Kidney Disease.    Anion gap 3 (L) 5 - 15  Glucose, capillary     Status: Abnormal   Collection Time: 09/14/14  6:43 AM  Result Value Ref Range   Glucose-Capillary 158 (H) 70 - 99 mg/dL  Glucose, capillary     Status: Abnormal   Collection Time: 09/14/14 10:59 AM  Result Value Ref Range   Glucose-Capillary 191 (H) 70 - 99 mg/dL  Glucose, capillary     Status: Abnormal   Collection Time: 09/14/14  4:57 PM  Result Value Ref Range   Glucose-Capillary 209 (H) 70 - 99 mg/dL  Glucose, capillary     Status: Abnormal   Collection Time: 09/14/14  8:55 PM  Result Value Ref Range   Glucose-Capillary 221 (H) 70 - 99 mg/dL     HEENT: normal Cardio: RRR and no murmur Resp: CTA B/L and unlabored GI: BS positive and NT, ND Extremity:  Pulses positive and No Edema Skin:   Intact Neuro: Alert/Oriented, Cranial Nerve II-XII normal, Normal Sensory and Abnormal Motor 3- Left delt bi tri grip, HF, KE 2- ankle DF Musc/Skel:  Normal Gen NAD   Assessment/Plan: 1. Functional deficits secondary to Left hemi secondary to Right Int Capsule infarct which require 3+ hours per day of interdisciplinary therapy in a comprehensive inpatient rehab setting. Physiatrist is providing close team supervision and 24 hour management of active medical problems listed below. Physiatrist and rehab team continue to assess barriers to discharge/monitor patient progress toward functional and medical goals. Given hx of noncompliance, substance abuse and poor social situation high risk for recurrent admissions for medical issues, will need to coordinate d/c plans with all resources possible Diabetes coordinator  consulted [facility] SW to coordinate FIM: FIM - Bathing Bathing Steps Patient Completed: Chest, Right Arm, Left Arm, Abdomen, Front perineal area, Buttocks, Right upper leg, Left upper leg Bathing: 4: Min-Patient completes 8-9 58f 10 parts or 75+ percent  FIM - Upper Body Dressing/Undressing Upper body dressing/undressing: 0: Wears gown/pajamas-no public clothing FIM - Lower Body Dressing/Undressing Lower body  dressing/undressing steps patient completed: Don/Doff right sock, Thread/unthread right underwear leg, Thread/unthread right pants leg, Thread/unthread left pants leg Lower body dressing/undressing: 3: Mod-Patient completed 50-74% of tasks  FIM - Toileting Toileting steps completed by patient: Performs perineal hygiene, Adjust clothing after toileting, Adjust clothing prior to toileting Kay: Grab bar or rail for support Toileting: 4: Steadying assist  FIM - [place]: Grab bars Toilet Transfers: 4-To toilet/BSC: Min A (steadying Pt. > 75%), 4-From toilet/BSC: Min A (steadying Pt. > 75%)  FIM - Control and instrumentation engineer Devices: Bed rails, Adult nurse Transfer: 4: Bed > Chair or W/C: Min A (steadying Pt. > 75%), 4: Supine > Sit: Min A (steadying Pt. > 75%/lift 1 leg), 5: Sit > Supine: Supervision (verbal cues/safety issues), 4: Chair or W/C > Bed: Min A (steadying Pt. > 75%)  FIM - Locomotion: Wheelchair Distance: 200 Locomotion: Wheelchair: 0: Activity did not occur (Pt ambulatory on unit) FIM - Locomotion: Ambulation Locomotion: Ambulation Assistive Devices: Walker - Rolling, Orthosis, Other (comment) (No AD then rolling walker with L hand orthosis) Ambulation/Gait Assistance: 4: Min guard, 4: Min assist Locomotion: Ambulation: 2: Travels 50 - 149 ft with minimal assistance (Pt.>75%)  Comprehension Comprehension Mode:  Auditory Comprehension: 6-Follows complex conversation/direction: With extra time/assistive device  Expression Expression Mode: Verbal Expression: 5-Expresses basic 90% of the time/requires cueing < 10% of the time.  Social Interaction Social Interaction: 6-Interacts appropriately with others with medication or extra time (anti-anxiety, antidepressant).  Problem Solving Problem Solving: 5-Solves complex 90% of the time/cues < 10% of the time  Memory Memory: 6-More than reasonable amt of time  Medical Problem List and Plan: 1. Functional deficits secondary to Right PLIC infarct/CR infarct 2.  DVT Prophylaxis/Anticoagulation: Subcutaneous heparin. Monitor platelet counts or any signs of bleeding 3. Pain Management: Tylenol as needed 4. Dysphagia. Mechanical soft diet. Follow-up speech therapy 5. Neuropsych: This patient is capable of making decisions on her own behalf. 6. Skin/Wound Care: Routine skin checks 7. Fluids/Electrolytes/Nutrition: Strict I and O follow-up chemistries 8. Hypertension. Norvasc 10 mg daily, hydrochlorothiazide 25 mg daily, lisinopril 20 mg daily. Monitor for increased mobility 9. Diabetes mellitus with peripheral neuropathy. Hemoglobin A1c 10.3. Currently with sliding scale insulin. Patient on Glucotrol 10 mg twice a day and Glucophage 1000 mg twice a day prior to admission but was not taking due to medical compliance and financial constraints. She will need full diabetic teaching.  Will try to simplify regimen 10. Hyperlipidemia. Lipitor 11. Medical noncompliance. Documentation of medical noncompliance and financial constraints prior to admission. Provide counseling and social work follow-up 12. History of polysubstance abuse. Counseling  LOS (Days) 4 A FACE TO FACE EVALUATION WAS PERFORMED  KIRSTEINS,ANDREW E 09/15/2014, 6:38 AM

## 2014-09-16 ENCOUNTER — Inpatient Hospital Stay (HOSPITAL_COMMUNITY): Payer: Self-pay | Admitting: Physical Therapy

## 2014-09-16 ENCOUNTER — Inpatient Hospital Stay (HOSPITAL_COMMUNITY): Payer: MEDICAID | Admitting: Occupational Therapy

## 2014-09-16 ENCOUNTER — Inpatient Hospital Stay (HOSPITAL_COMMUNITY): Payer: Self-pay | Admitting: Speech Pathology

## 2014-09-16 DIAGNOSIS — I639 Cerebral infarction, unspecified: Secondary | ICD-10-CM

## 2014-09-16 LAB — GLUCOSE, CAPILLARY
GLUCOSE-CAPILLARY: 186 mg/dL — AB (ref 70–99)
Glucose-Capillary: 112 mg/dL — ABNORMAL HIGH (ref 70–99)
Glucose-Capillary: 117 mg/dL — ABNORMAL HIGH (ref 70–99)
Glucose-Capillary: 210 mg/dL — ABNORMAL HIGH (ref 70–99)

## 2014-09-16 MED ORDER — WHITE PETROLATUM GEL
Status: AC
  Administered 2014-09-16: 0.2
  Filled 2014-09-16: qty 1

## 2014-09-16 MED ORDER — GLIMEPIRIDE 4 MG PO TABS
4.0000 mg | ORAL_TABLET | Freq: Every day | ORAL | Status: DC
  Administered 2014-09-17 – 2014-09-26 (×10): 4 mg via ORAL
  Filled 2014-09-16 (×13): qty 1

## 2014-09-16 MED ORDER — DICLOFENAC SODIUM 1 % TD GEL
2.0000 g | Freq: Three times a day (TID) | TRANSDERMAL | Status: DC
Start: 2014-09-16 — End: 2014-09-26
  Administered 2014-09-16 – 2014-09-26 (×16): 2 g via TOPICAL
  Filled 2014-09-16: qty 100

## 2014-09-16 NOTE — Progress Notes (Signed)
Occupational Therapy Session Note  Patient Details  Name: Diamond Rice MRN: [id number] Date of Birth: August 08, 1971  Today's Date: 09/16/2014 OT Individual Time: 9794-8016 OT Individual Time Calculation (min): 45 min    Short Term Goals: Week 1:  OT Short Term Goal 1 (Week 1): Pt will complete bathing with supervision at sit > stand level OT Short Term Goal 2 (Week 1): Pt will complete toilet transfers with supervision with LRAD OT Short Term Goal 3 (Week 1): Pt will complete LB dressing wtih min assist OT Short Term Goal 4 (Week 1): Pt will utilize LUE as gross assist with self-feeding tasks with min verbal cues  Skilled Therapeutic Interventions/Progress Updates:    Engaged in ADL retraining with focus on functional mobility, transfers, and functional use of LUE during self-care tasks.  Pt ambulated to toilet and then room shower with RW with close supervision.  Bathing completed at overall supervision level with increased use of LUE during bathing tasks without verbal cue.  Pt with 1 LOB to Lt when exiting shower, requiring mod assist to correct. Propped foot in chair in front of recliner to assist with donning socks, pt able to don both socks this session with increased time. Therapist assisted with shoes secondary to time constraints.  Pt left seated in recliner with breakfast and all needs in reach.  Therapy Documentation Precautions:  Precautions Precautions: Fall Precaution Comments: L hemiparesis Restrictions Weight Bearing Restrictions: No Pain: Pain Assessment Pain Assessment: No/denies pain Pain Score: 0-No pain  See FIM for current functional status  Therapy/Group: Individual Therapy  Simonne Come 09/16/2014, 9:54 AM

## 2014-09-16 NOTE — Progress Notes (Addendum)
Inpatient RehabilitationTeam Conference and Plan of Care Update Date: 09/16/2014   Time: 10:30 AM    [name]      Medical Record Number: [id number]  Date of Birth: 10/06/1970 Sex: Female         Room/Bed: 4M06C/4M06C-01 Payor Info: Payor: MEDICAID POTENTIAL / Plan: MEDICAID POTENTIAL / Product Type: *No Product type* /    Admitting Diagnosis: RT CVA  Admit Date/Time:  09/11/2014  2:51 PM Admission Comments: No comment available   Primary Diagnosis:  Right-sided lacunar infarction Principal Problem: Right-sided lacunar infarction  Patient Active Problem List   Diagnosis Date Noted  . Right-sided lacunar infarction 09/15/2014  . Left hemiparesis 09/15/2014  . Nausea without vomiting   . CVA (cerebral infarction) 09/11/2014  . Dyspnea   . CVA (cerebral vascular accident) 09/08/2014  . Pain in the chest   . Essential hypertension   . Type 2 diabetes mellitus with complication   . Back pain 09/11/2013  . Psychoactive substance-induced organic mood disorder 05/28/2013  . Cocaine abuse with cocaine-induced mood disorder 05/28/2013  . Cannabis dependence with cannabis-induced anxiety disorder 05/28/2013  . Morbid obesity with BMI of 45.0-49.9, adult 04/25/2012  . Chest pain 04/17/2012  . Hypertension 04/17/2012  . DM (diabetes mellitus), type 2, uncontrolled 04/17/2012  . Hyperlipidemia 04/17/2012  . Tobacco use 04/17/2012    Expected Discharge Date: Expected Discharge Date: 09/26/14  Team Members Present: Physician leading conference: Dr. Alysia Penna Social Worker Present: Alfonse Alpers, LCSW Nurse Present: Heather Roberts, RN PT Present: Georjean Mode, PT;Blair Hobble, PT OT Present: Simonne Come, Dorothyann Gibbs, OT SLP Present: Windell Moulding, SLP PPS Coordinator present : Daiva Nakayama, RN, CRRN     Current Status/Progress Goal Weekly Team Focus  Medical   anxiety and depression, ?back pain  Home d/c  diabetic management   Bowel/Bladder   Continent  of bowel and bladder; LBM 1/17  Mod I assist  Assess and treat for constipation as needed   Swallow/Nutrition/ Hydration   Dys 3, thin liquids, intemittent supervision  Independent with least restrictive diet   trials of advanced consistencies, use of swallowing precautions    ADL's   min assist overall  mod I  LUE NMR, dynamic standing balance, functional mobility, transfers   Mobility   Min A overall  Mod I overall  L NMR, motor control in L knee, dynamic standing balance, activity tolerance, transfer/gait training, pt education   Communication   mild dysarthria, harsh vocal quality   mod I for use of dysarthria strategies in conversation  resonant voice therapy, vocal hygiene, oral motor exercises   Safety/Cognition/ Behavioral Observations  grossly intact for basic tasks   supervision      Pain   C/o back pain; bilat knee pain; and pain down entire left side at times; tylenol 650mg  po prn; ultram 50mg  po prn; seems effective  < 3  Assess and treat for pain q shift and prn   Skin   No skin breakdown or infection noted  Mod I assist  Assess skin q shift and prn    Rehab Goals Patient on target to meet rehab goals: Yes Rehab Goals Revised: None - pt's first conference  *See Care Plan and progress notes for long and short-term goals.  Barriers to Discharge: poor social situation    Possible Resolutions to Barriers:  marshal community resources    Discharge Planning/Teaching Needs:  At this time, pt is planning to go to her friend's home at d/c.  She is unsure how long she will be able to stay there since she will not have any income to contribute to the household.  Pt's goals are for modified independent.   Team Discussion:  Pt with multiple hospital visits due to noncompliance.  Diabetic Educator feels pt will need insulin, although her sugars have been better in the last two days and pt is resistant to insulin due to the needles and the cost.  Pt has some chronic back pain and  nursing and MD will work together to address this.  Pt has modified independent goals.  Pt appears anxious with SLP, but does not share with therapist about this.  CSW has asked for neuropsychologist to see pt.  Pt is cognitively intact, for the most part according to the SLP, but they continue to work on voice quality and swallowing.  Pt was on and off lethargic during speech session yesterday.  Revisions to Treatment Plan:  None   Continued Need for Acute Rehabilitation Level of Care: The patient requires daily medical management by a physician with specialized training in physical medicine and rehabilitation for the following conditions: Daily direction of a multidisciplinary physical rehabilitation program to ensure safe treatment while eliciting the highest outcome that is of practical value to the patient.: Yes Daily medical management of patient stability for increased activity during participation in an intensive rehabilitation regime.: Yes Daily analysis of laboratory values and/or radiology reports with any subsequent need for medication adjustment of medical intervention for : Neurological problems  Diamond Rice, Diamond Rice 09/16/2014, 11:31 AM

## 2014-09-16 NOTE — Progress Notes (Signed)
Speech Language Pathology Daily Session Note  Patient Details  Name: Diamond Rice MRN: [id number] Date of Birth: [birth year]  Today's Date: 09/16/2014 SLP Individual Time: 1410-1440 SLP Individual Time Calculation (min): 30 min  Short Term Goals: Week 1: SLP Short Term Goal 1 (Week 1): Patient will tolerate trials of advanced consistencies in order to upgrade diet, min A SLP Short Term Goal 2 (Week 1): Pt will target L-sided weakness by completing oral motor exercises with use of visual aide, min A.  SLP Short Term Goal 3 (Week 1): Pt will improve speech intelligibility at the phrase level to ~90% accuracy with mod cues for use of dysarthria strategies.   Skilled Therapeutic Interventions:  Pt was seen for skilled ST targeting goals for dysarthria.  Upon arrival, pt was seated upright in wheelchair, awake, alert, and agreeable to participate in ST.  SLP facilitated the session with a structured barrier activity targeting use of dysarthria strategies.  Pt was >90% intelligible at the sentence level during the abovementioned task, and was >80% intelligible during unstructured conversations.  Pt was noted with improved vocal intensity during today's therapy session in comparison to previous sessions and was noted to return demonstration of vocal hygiene techniques with supervision cues.  Continue per current plan of care.     FIM:  Comprehension Comprehension Mode: Auditory Comprehension: 6-Follows complex conversation/direction: With extra time/assistive device Expression Expression Mode: Verbal Expression: 5-Expresses basic needs/ideas: With extra time/assistive device Social Interaction Social Interaction: 6-Interacts appropriately with others with medication or extra time (anti-anxiety, antidepressant). Problem Solving Problem Solving: 6-Solves complex problems: With extra time Memory Memory: 6-Assistive device: No helper  Pain Pain Assessment Pain Assessment: No/denies  pain  Therapy/Group: Individual Therapy  Davontae Prusinski, Selinda Orion 09/16/2014, 9:02 PM

## 2014-09-16 NOTE — Progress Notes (Signed)
Physical Therapy Session Note  Patient Details  Name: Diamond Rice MRN: [id number] Date of Birth: [birth year]  Today's Date: 09/16/2014 PT Individual Time: 0175-1025 and 1300-1400 PT Individual Time Calculation (min): 45 min and 60 min   Short Term Goals: Week 1:  PT Short Term Goal 1 (Week 1): Pt will demonstrate transfers w/c to/from bed req SBA.  PT Short Term Goal 2 (Week 1): Pt will ambulate >= 150' with RW req SBA. PT Short Term Goal 3 (Week 1): Pt will ascend/descend >= 5 stairs with 1 or 2 rails req SBA. PT Short Term Goal 4 (Week 1): Pt will demonstrate mod I bed mobility.  PT Short Term Goal 5 (Week 1): Pt will demonstrate car transfer with CGA.   Skilled Therapeutic Interventions/Progress Updates:    Treatment Session 1: Session began 13 minutes later than scheduled per pt request to finish speaking with visitor. Pt received seated in recliner; reporting fatigue but agreeable to therapy. Pt ambulated to/from bathroom with rolling walker, L hand orthosis, and supervision, no overt LOB. Toilet transfer performed with supervision using rolling walker.  Due to pt-expressed need to wash clothing, encouraged pt to ambulate to/from washer/dryer to perform activity. Pt performed functional ambulation x60' in controlled environment with rolling walker, L hand orthosis, and supervision. Gait trial ended secondary to pt-report pain in anteromedial aspect of R knee. Observation of knee revealed no soft tissue swelling, erythema, or gross abnormality.  ACE bandage (donned for compression) was effective in controlling R knee pain. Pt continued with ambulation for additional 50' prior to pt requesting seated rest break, stating, "I don't know what's wrong with me, but I need to sit down." Transported pt remaining distance to washer/dryer with total A for time management. While standing with LUE support at walker, pt utilized RUE to place clothing in washer with supervision for stability/balance.  Session ended in pt room, where pt was left seated in recliner with all needs within reach.  Treatment Session 2: Pt received seated in recliner; agreeable to therapy but again, reporting increased fatigue. RN made aware. Pt ambulated to/from bathroom and performed toilet transfer with technique/assist as described in AM session. Current session planned to focus on gait training, motor control in LLE; however, pt complaining of pain in lower back, requesting alternative w/c due to pt perception of decreased safety transferring into/out of current w/c. Therefore, majority of current session focused on modifying w/c to improve pt positioning and increase pt-perceived safety, confidence with functional transfers. After pt expressed feeling safe/comfortable with w/c modification, remainder of session focused on increasing motor control in L knee during standing. See below for detailed description of NMR. Session ended in pt room, where pt was left seated in w/c with all needs within reach. Spoke with RN concerning change in pt's affect since initial session with this PT on 1/18.   Therapy Documentation Precautions:  Precautions Precautions: Fall Precaution Comments: L hemiparesis Restrictions Weight Bearing Restrictions: No Pain: AM session: Pain Assessment Pain Assessment: FLACC Pain Score: 4  Pain Type: Acute pain Pain Location: Right Knee Pain Descriptors / Indicators: Aching Pain Onset: With Activity Pain Intervention(s): Other (comment) compression PM session: ain Assessment Pain Assessment: 0-10 Pain Score: 10 - Worst ever Pain Type: Acute pain Pain Location: Back Pain Orientation: Lower Pain Descriptors / Indicators: Aching Pain Onset: With Activity Pain Intervention(s): RN notified; Other (comment) modified w/c NMR: Focused on motor control in L hamstrings. While standing with bilat UE support at rolling walker, mirror positioned  to L of pt for visual feedback, pt performed 1/4  squats with tactile cueing at L hamstrings for control of L knee extension, at L gluteus maximus for hip extension. Progressed to 1/2 squats to pt fatigue (onset of L genu recurvatum).    See FIM for current functional status  Therapy/Group: Individual Therapy  Stefano Gaul 09/16/2014, 6:38 PM

## 2014-09-16 NOTE — Progress Notes (Signed)
  Note: Spoke with patient about diabetes and home regimen for diabetes control. Patient reports that she does not have a PCP but is expecting to go to the [facility] upon discharge. Prior to admission, patient had been taking Metformin 1,000 mg BID and Glipizide 10 mg BID until she ran out. Pt reports that she had ran out of money and had actually ran out of medication 2 months prior to admission.  Inquired about knowledge about A1C and patient reports that she does not know what an A1C is. Discussed A1C results (10.3% on 08/28/2014) and explained what an A1C is, basic pathophysiology of DM Type 2, basic home care, importance of checking CBGs and maintaining good CBG control to prevent long-term and short-term complications. Patient had reported that she no longer had a functioning meter to check her blood glucose. When she did have a meter, her blood glucose would run in between 200-300 mg/dl. Pt will need a prescription for a new meter kit with supplies on discharge. Discussed impact of nutrition, exercise, stress, sickness, and medications on diabetes control.  Discussed with patient to continue physical activity after discharge to reduce blood glucose. Pt has had intense physical therapy since being on Rehab Unit and blood glucose is at goal. Patient reports only drinking water. Discussed carbohydrates, carbohydrate goals per day and meal, along with portion sizes.  Patient mentioned that she is not sure if she would be discharged on insulin or her oral medications. I asked her if she had ever taken insulin before. Pt reports that she was placed on insulin in the past after a hospitalization in [place]. She was then placed on oral medications. Patient is comfortable with giving herself injections. Patient is applying for Medicaid but currently is without insurance. If pt being started on insulin, Novolin 70/30 insulin would be appropriate as it is $25 dollars at [facility]. If her oral  medications are to be resumed, they are both on the $4 medication list at [facility]. Patient verbalized understanding of information discussed and she states that she has no further questions at this time related to diabetes.   MD: Please order at time of discharge a glucose meter, testing supplies, and DM medications.  Thanks, Tama Headings, RN, MSN, [facility] Diabetes Coordinator Inpatient Diabetes Program [phone] (Team Pager)

## 2014-09-16 NOTE — Progress Notes (Signed)
43 y.o. right handed female with history of hypertension, diabetes mellitus and peripheral neuropathy, polysubstance abuse and medical noncompliance due to financial hardship. Patient recently homeless and has been living with friends recently. Presented 09/08/2014 with left-sided weakness and slurred speech. Patient with recent admission 08/28/2014 for chest pain and elevated blood pressure with negative troponins urine drug screen was positive at that time for marijuana workup unremarkable. MRI of the brain showed small acute infarct posterior limb of the right internal capsule and corona radiata. MRA without major occlusion or stenosis. Recent echocardiogram with ejection fraction of 65% left ventricular diastolic dysfunction. Carotid Dopplers with no ICA stenosis. Patient did not receive TPA. Neurology services consulted maintained on aspirin for CVA prophylaxis   Subjective/Complaints: Appreciate dietary and diabetes coordinator notes  Review of Systems - Negative except occ back pain   Objective: Vital Signs: Blood pressure 139/77, pulse 74, temperature 98.1 F (36.7 C), temperature source Oral, resp. rate 18, last menstrual period 08/28/2014, SpO2 98 %. No results found. Results for orders placed or performed during the hospital encounter of 09/11/14 (from the past 72 hour(s))  Glucose, capillary     Status: Abnormal   Collection Time: 09/13/14 11:56 AM  Result Value Ref Range   Glucose-Capillary 159 (H) 70 - 99 mg/dL  Glucose, capillary     Status: Abnormal   Collection Time: 09/13/14  4:04 PM  Result Value Ref Range   Glucose-Capillary 218 (H) 70 - 99 mg/dL  Glucose, capillary     Status: Abnormal   Collection Time: 09/13/14  8:36 PM  Result Value Ref Range   Glucose-Capillary 199 (H) 70 - 99 mg/dL  CBC WITH DIFFERENTIAL     Status: Abnormal   Collection Time: 09/14/14  6:13 AM  Result Value Ref Range   WBC 10.1 4.0 - 10.5 K/uL   RBC 5.09 3.87 - 5.11 MIL/uL   Hemoglobin 11.7  (L) 12.0 - 15.0 g/dL   HCT 36.6 36.0 - 46.0 %   MCV 71.9 (L) 78.0 - 100.0 fL   MCH 23.0 (L) 26.0 - 34.0 pg   MCHC 32.0 30.0 - 36.0 g/dL   RDW 15.5 11.5 - 15.5 %   Platelets 349 150 - 400 K/uL   Neutrophils Relative % 49 43 - 77 %   Neutro Abs 5.0 1.7 - 7.7 K/uL   Lymphocytes Relative 36 12 - 46 %   Lymphs Abs 3.7 0.7 - 4.0 K/uL   Monocytes Relative 11 3 - 12 %   Monocytes Absolute 1.1 (H) 0.1 - 1.0 K/uL   Eosinophils Relative 4 0 - 5 %   Eosinophils Absolute 0.4 0.0 - 0.7 K/uL   Basophils Relative 0 0 - 1 %   Basophils Absolute 0.0 0.0 - 0.1 K/uL  Comprehensive metabolic panel     Status: Abnormal   Collection Time: 09/14/14  6:13 AM  Result Value Ref Range   Sodium 133 (L) 135 - 145 mmol/L    Comment: Please note change in reference range.   Potassium 3.8 3.5 - 5.1 mmol/L    Comment: Please note change in reference range.   Chloride 99 96 - 112 mEq/L   CO2 31 19 - 32 mmol/L   Glucose, Bld 183 (H) 70 - 99 mg/dL   BUN 14 6 - 23 mg/dL   Creatinine, Ser 1.13 (H) 0.50 - 1.10 mg/dL   Calcium 9.2 8.4 - 10.5 mg/dL   Total Protein 6.7 6.0 - 8.3 g/dL   Albumin 2.8 (L) 3.5 -  5.2 g/dL   AST 19 0 - 37 U/L   ALT 20 0 - 35 U/L   Alkaline Phosphatase 70 39 - 117 U/L   Total Bilirubin 0.6 0.3 - 1.2 mg/dL   GFR calc non Af Amer 59 (L) >90 mL/min   GFR calc Af Amer 68 (L) >90 mL/min    Comment: (NOTE) The eGFR has been calculated using the CKD EPI equation. This calculation has not been validated in all clinical situations. eGFR's persistently <90 mL/min signify possible Chronic Kidney Disease.    Anion gap 3 (L) 5 - 15  Glucose, capillary     Status: Abnormal   Collection Time: 09/14/14  6:43 AM  Result Value Ref Range   Glucose-Capillary 158 (H) 70 - 99 mg/dL  Glucose, capillary     Status: Abnormal   Collection Time: 09/14/14 10:59 AM  Result Value Ref Range   Glucose-Capillary 191 (H) 70 - 99 mg/dL  Glucose, capillary     Status: Abnormal   Collection Time: 09/14/14  4:57 PM   Result Value Ref Range   Glucose-Capillary 209 (H) 70 - 99 mg/dL  Glucose, capillary     Status: Abnormal   Collection Time: 09/14/14  8:55 PM  Result Value Ref Range   Glucose-Capillary 221 (H) 70 - 99 mg/dL  Glucose, capillary     Status: Abnormal   Collection Time: 09/15/14  6:28 AM  Result Value Ref Range   Glucose-Capillary 144 (H) 70 - 99 mg/dL  Glucose, capillary     Status: None   Collection Time: 09/15/14 11:58 AM  Result Value Ref Range   Glucose-Capillary 85 70 - 99 mg/dL   Comment 1 Notify RN   Glucose, capillary     Status: Abnormal   Collection Time: 09/15/14  4:40 PM  Result Value Ref Range   Glucose-Capillary 187 (H) 70 - 99 mg/dL  Glucose, capillary     Status: Abnormal   Collection Time: 09/15/14  8:29 PM  Result Value Ref Range   Glucose-Capillary 213 (H) 70 - 99 mg/dL  Glucose, capillary     Status: Abnormal   Collection Time: 09/16/14  6:39 AM  Result Value Ref Range   Glucose-Capillary 112 (H) 70 - 99 mg/dL     HEENT: normal Cardio: RRR and no murmur Resp: CTA B/L and unlabored GI: BS positive and NT, ND Extremity:  Pulses positive and No Edema Skin:   Intact Neuro: Alert/Oriented, Cranial Nerve II-XII normal, Normal Sensory and Abnormal Motor 3- Left delt bi tri grip, HF, KE 2- ankle DF Musc/Skel:  Normal Gen NAD   Assessment/Plan: 1. Functional deficits secondary to Left hemi secondary to Right Int Capsule infarct which require 3+ hours per day of interdisciplinary therapy in a comprehensive inpatient rehab setting. Physiatrist is providing close team supervision and 24 hour management of active medical problems listed below. Physiatrist and rehab team continue to assess barriers to discharge/monitor patient progress toward functional and medical goals. Given hx of noncompliance, substance abuse and poor social situation high risk for recurrent admissions for medical issues, will need to coordinate d/c plans with all resources possible Diabetes  coordinator consulted [place] [facility] SW to coordinate Team conference today please see physician documentation under team conference tab, met with team face-to-face to discuss problems,progress, and goals. Formulized individual treatment plan based on medical history, underlying problem and comorbidities.FIM: FIM - Bathing Bathing Steps Patient Completed: Chest, Right Arm, Left Arm, Abdomen, Front perineal area, Buttocks,  Right upper leg, Left upper leg Bathing: 4: Min-Patient completes 8-9 49f 10 parts or 75+ percent  FIM - Upper Body Dressing/Undressing Upper body dressing/undressing steps patient completed: Thread/unthread right sleeve of pullover shirt/dresss, Thread/unthread left sleeve of pullover shirt/dress, Put head through opening of pull over shirt/dress, Pull shirt over trunk Upper body dressing/undressing: 5: Set-up assist to: Obtain clothing/put away FIM - Lower Body Dressing/Undressing Lower body dressing/undressing steps patient completed: Thread/unthread right pants leg, Pull pants up/down, Don/Doff right shoe Lower body dressing/undressing: 2: Max-Patient completed 25-49% of tasks  FIM - Toileting Toileting steps completed by patient: Performs perineal hygiene, Adjust clothing after toileting, Adjust clothing prior to toileting Toileting Assistive Devices: Grab bar or rail for support Toileting: 4: Steadying assist  FIM - Radio producer Devices: Grab bars Toilet Transfers: 4-To toilet/BSC: Min A (steadying Pt. > 75%), 4-From toilet/BSC: Min A (steadying Pt. > 75%)  FIM - Bed/Chair Transfer Bed/Chair Transfer Assistive Devices: Walker, Arm rests Bed/Chair Transfer: 4: Bed > Chair or W/C: Min A (steadying Pt. > 75%), 4: Chair or W/C > Bed: Min A (steadying Pt. > 75%)  FIM - Locomotion: Wheelchair Distance: 200 Locomotion: Wheelchair: 0: Activity did not occur (Pt ambulatory on unit) FIM - Locomotion:  Ambulation Locomotion: Ambulation Assistive Devices: Walker - Rolling, Orthosis, Other (comment) (L hand orthosis,  ACE bandage to prevent L genu recurvatum) Ambulation/Gait Assistance: 4: Min guard Locomotion: Ambulation: 2: Travels 50 - 149 ft with minimal assistance (Pt.>75%)  Comprehension Comprehension Mode: Auditory Comprehension: 7-Follows complex conversation/direction: With no assist  Expression Expression Mode: Verbal Expression: 7-Expresses complex ideas: With no assist  Social Interaction Social Interaction: 6-Interacts appropriately with others with medication or extra time (anti-anxiety, antidepressant).  Problem Solving Problem Solving: 6-Solves complex problems: With extra time  Memory Memory: 7-Complete Independence: No helper  Medical Problem List and Plan: 1. Functional deficits secondary to Right PLIC infarct/CR infarct 2.  DVT Prophylaxis/Anticoagulation: Subcutaneous heparin. Monitor platelet counts or any signs of bleeding 3. Pain Management: Tylenol as needed 4. Dysphagia. Mechanical soft diet. Follow-up speech therapy 5. Neuropsych: This patient is capable of making decisions on her own behalf. 6. Skin/Wound Care: Routine skin checks 7. Fluids/Electrolytes/Nutrition: Strict I and O follow-up chemistries 8. Hypertension. Norvasc 10 mg daily, hydrochlorothiazide 25 mg daily, lisinopril 20 mg daily. Monitor for increased mobility 9. Diabetes mellitus with peripheral neuropathy. Hemoglobin A1c 10.3. Currently with sliding scale insulin. Patient on Glucotrol 10 mg twice a day and Glucophage 1000 mg twice a day prior to admission but was not taking due to medical compliance and financial constraints. She will need full diabetic teaching.  Will try to simplify regimen 10. Hyperlipidemia. Lipitor 11. Medical noncompliance. Documentation of medical noncompliance and financial constraints prior to admission. Provide counseling and social work follow-up 12. History of  polysubstance abuse. Counseling  LOS (Days) 5 A FACE TO FACE EVALUATION WAS PERFORMED  KIRSTEINS,ANDREW E 09/16/2014, 7:52 AM

## 2014-09-16 NOTE — Progress Notes (Signed)
Social Work Patient ID: Diamond Rice, female   DOB: October 13, 1970, 44 y.o.   MRN: [id number]   CSW met with pt to update her on team conference discussion.  Pt was pleased with d/c date of 09-26-14.  She is still concerned about housing, but feels her friend will allow her to come stay there while she is waiting on housing assistance.  She would like CSW to help her start this process.  CSW to also help pt get connected to other community resources.  Pt was very appreciative and very open with CSW this visit.  Made pt aware that she will be seen by neuropsychologist tomorrow and she was agreeable.  CSW to continue to follow.

## 2014-09-17 ENCOUNTER — Inpatient Hospital Stay (HOSPITAL_COMMUNITY): Payer: MEDICAID | Admitting: Physical Therapy

## 2014-09-17 ENCOUNTER — Encounter (HOSPITAL_COMMUNITY): Payer: Self-pay

## 2014-09-17 ENCOUNTER — Ambulatory Visit (HOSPITAL_COMMUNITY): Payer: Self-pay | Admitting: Speech Pathology

## 2014-09-17 ENCOUNTER — Inpatient Hospital Stay (HOSPITAL_COMMUNITY): Payer: MEDICAID | Admitting: Occupational Therapy

## 2014-09-17 LAB — GLUCOSE, CAPILLARY
GLUCOSE-CAPILLARY: 76 mg/dL (ref 70–99)
GLUCOSE-CAPILLARY: 389 mg/dL — AB (ref 70–99)
Glucose-Capillary: 109 mg/dL — ABNORMAL HIGH (ref 70–99)
Glucose-Capillary: 149 mg/dL — ABNORMAL HIGH (ref 70–99)

## 2014-09-17 MED ORDER — FLEET ENEMA 7-19 GM/118ML RE ENEM: 1.0000 | ENEMA | Freq: Every day | RECTAL | Status: DC | PRN

## 2014-09-17 MED ORDER — BISACODYL 10 MG RE SUPP
10.0000 mg | Freq: Every day | RECTAL | Status: DC | PRN
  Administered 2014-09-17: 10 mg via RECTAL
  Filled 2014-09-17: qty 1

## 2014-09-17 NOTE — Progress Notes (Signed)
Physical Therapy Session Note  Patient Details  Name: Diamond Rice MRN: [id number] Date of Birth: 08/21/1971  Today's Date: 09/17/2014 PT Individual Time: 8453-6468 PT Individual Time Calculation (min): 60 min   Short Term Goals: Week 1:  PT Short Term Goal 1 (Week 1): Pt will demonstrate transfers w/c to/from bed req SBA.  PT Short Term Goal 2 (Week 1): Pt will ambulate >= 150' with RW req SBA. PT Short Term Goal 3 (Week 1): Pt will ascend/descend >= 5 stairs with 1 or 2 rails req SBA. PT Short Term Goal 4 (Week 1): Pt will demonstrate mod I bed mobility.  PT Short Term Goal 5 (Week 1): Pt will demonstrate car transfer with CGA.   Skilled Therapeutic Interventions/Progress Updates:    Pt received seated EOB, finishing lunch, with RN present. Pt agreeable to therapy. Sessio focused on gait training using LiteGait. Pt performed gait x60' in controlled environment with min A, R HHA, and tactile cueing at distal L hamstrings during final 30' to prevent L genu recurvatum. Initially, performed gait training on treadmill using LiteGait; however, pt with increased anxiety, fear of falling on treadmill. Therefore, transitioned to use of LiteGait over ground x280' total (standing rest break x1) with harness biased to promote upright posture and to increase weight shift to L side. Tactile cueing at distal L hamstrings during LLE stance to prevent L genu recurvatum. Modification of harness to decrease LLE weightbearing was effective in promoting consistent L knee control during L mid to terminal stance. Returned to room via gait x60'' with min A, R HHA and verbal cueing for decreased LLE step length; no L genu recurvatum noted during this trial. Ambulated to/from bathroom with rolling walker, L hand orthosis, and supervision. Encouraged pt to stay up in w/c; however, pt declining due to fatigue. Departed with pt semi reclined in bed with 3 rails up, bed alarm on, and all needs within reach. Noted improved  affect, no perseveration on pain during this session.  Therapy Documentation Precautions:  Precautions Precautions: Fall Precaution Comments: L hemiparesis Restrictions Weight Bearing Restrictions: No Vital Signs: Therapy Vitals Temp: 98.3 F (36.8 C) Temp Source: Oral Pulse Rate: 82 Resp: 14 BP: 138/79 mmHg Patient Position (if appropriate): Lying Oxygen Therapy SpO2: 100 % O2 Device: Not Delivered Pain: Pain Assessment Pain Assessment: No/denies pain Locomotion : Ambulation Ambulation/Gait Assistance: 4: Min guard;4: Min assist;5: Supervision   See FIM for current functional status  Therapy/Group: Individual Therapy  [name], Malva Cogan 09/17/2014, 4:50 PM
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
## 2014-09-17 NOTE — Progress Notes (Signed)
Occupational Therapy Session Note  Patient Details  Name: Diamond Rice MRN: [id number] Date of Birth: 02/15/1971  Today's Date: 09/17/2014 OT Individual Time: 1100-1200 OT Individual Time Calculation (min): 60 min    Short Term Goals: Week 1:  OT Short Term Goal 1 (Week 1): Pt will complete bathing with supervision at sit > stand level OT Short Term Goal 2 (Week 1): Pt will complete toilet transfers with supervision with LRAD OT Short Term Goal 3 (Week 1): Pt will complete LB dressing wtih min assist OT Short Term Goal 4 (Week 1): Pt will utilize LUE as gross assist with self-feeding tasks with min verbal cues  Skilled Therapeutic Interventions/Progress Updates:    Engaged in ADL retraining with focus on functional mobility, transfers, and functional use of LUE.  Pt ambulated to room toilet and shower with supervision with RW, completing all toileting tasks and bathing at supervision level.  Dressing completed at sit > stand level in room with use of chair to prop up leg to don socks and shoes.  Issued pt shoe buttons to fasten shoes with pt demonstrating fastening and unfastening.  Pt utilized LUE as gross assist during bathing, dressing, and applying lotion performing self hand over hand to increase success when able.  Pt left seated EOB with lunch tray setup.  Therapy Documentation Precautions:  Precautions Precautions: Fall Precaution Comments: L hemiparesis Restrictions Weight Bearing Restrictions: No Pain:  Pt with no c/o pain  See FIM for current functional status  Therapy/Group: Individual Therapy  Simonne Come 09/17/2014, 12:07 PM

## 2014-09-17 NOTE — Progress Notes (Signed)
Speech Language Pathology Daily Session Note  Patient Details  Name: Diamond Rice MRN: [id number] Date of Birth: 1971/08/02  Today's Date: 09/17/2014 SLP Concurrent Time: 0800-0900 SLP Concurrent Time Calculation (min): 60 min   Short Term Goals: Week 1: SLP Short Term Goal 1 (Week 1): Patient will tolerate trials of advanced consistencies in order to upgrade diet, min A SLP Short Term Goal 2 (Week 1): Pt will target L-sided weakness by completing oral motor exercises with use of visual aide, min A.  SLP Short Term Goal 3 (Week 1): Pt will improve speech intelligibility at the phrase level to ~90% accuracy with mod cues for use of dysarthria strategies.   Skilled Therapeutic Interventions:  Pt was seen for skilled ST targeting goals for speech intelligibility.  Upon arrival, pt was reclined in bed, awake, alert, and agreeable to participate in [facility].  SLP facilitated the session with supervision cues for use of dysarthria strategies and vocal hygiene techniques during functional conversations with an unfamiliar communication partner in a loud and distracting environment.  Pt was >80% intelligible during conversations, with breakdown most consistently attributed to decreased vocal intensity and decreased breath support.  Pt returned demonstration of vocal hygiene techniques with Mod I throughout today's session.  Continue per current plan of care.      FIM:  Comprehension Comprehension Mode: Auditory Comprehension: 6-Follows complex conversation/direction: With extra time/assistive device Expression Expression Mode: Verbal Expression: 5-Expresses basic needs/ideas: With extra time/assistive device Social Interaction Social Interaction: 6-Interacts appropriately with others with medication or extra time (anti-anxiety, antidepressant). Problem Solving Problem Solving: 5-Solves complex 90% of the time/cues < 10% of the time Memory Memory: 6-More than reasonable amt of time FIM -  Eating Eating Activity: 6: Modified consistency diet: (comment);6: More than reasonable amount of time;5: Set-up assist for cut food;5: Set-up assist for open containers  Pain Pain Assessment Pain Assessment: No/denies pain  Therapy/Group: Other: Concurrent therapy session   Adair Lauderback, Selinda Orion 09/17/2014, 2:18 PM

## 2014-09-17 NOTE — Progress Notes (Signed)
44 y.o. right handed female with history of hypertension, diabetes mellitus and peripheral neuropathy, polysubstance abuse and medical noncompliance due to financial hardship. Patient recently homeless and has been living with friends recently. Presented 09/08/2014 with left-sided weakness and slurred speech. Patient with recent admission 08/28/2014 for chest pain and elevated blood pressure with negative troponins urine drug screen was positive at that time for marijuana workup unremarkable. MRI of the brain showed small acute infarct posterior limb of the right internal capsule and corona radiata. MRA without major occlusion or stenosis. Recent echocardiogram with ejection fraction of 65% left ventricular diastolic dysfunction. Carotid Dopplers with no ICA stenosis. Patient did not receive TPA. Neurology services consulted maintained on aspirin for CVA prophylaxis   Subjective/Complaints: No issues overnite Eating without cough, int sup Discussed med compliance Discussed follow up plans Review of Systems - Negative except occ back pain   Objective: Vital Signs: Blood pressure 121/79, pulse 84, temperature 98.4 F (36.9 C), temperature source Oral, resp. rate 18, weight 105.461 kg (232 lb 8 oz), last menstrual period 08/28/2014, SpO2 100 %. No results found. Results for orders placed or performed during the hospital encounter of 09/11/14 (from the past 72 hour(s))  Glucose, capillary     Status: Abnormal   Collection Time: 09/14/14 10:59 AM  Result Value Ref Range   Glucose-Capillary 191 (H) 70 - 99 mg/dL  Glucose, capillary     Status: Abnormal   Collection Time: 09/14/14  4:57 PM  Result Value Ref Range   Glucose-Capillary 209 (H) 70 - 99 mg/dL  Glucose, capillary     Status: Abnormal   Collection Time: 09/14/14  8:55 PM  Result Value Ref Range   Glucose-Capillary 221 (H) 70 - 99 mg/dL  Glucose, capillary     Status: Abnormal   Collection Time: 09/15/14  6:28 AM  Result Value Ref  Range   Glucose-Capillary 144 (H) 70 - 99 mg/dL  Glucose, capillary     Status: None   Collection Time: 09/15/14 11:58 AM  Result Value Ref Range   Glucose-Capillary 85 70 - 99 mg/dL   Comment 1 Notify RN   Glucose, capillary     Status: Abnormal   Collection Time: 09/15/14  4:40 PM  Result Value Ref Range   Glucose-Capillary 187 (H) 70 - 99 mg/dL  Glucose, capillary     Status: Abnormal   Collection Time: 09/15/14  8:29 PM  Result Value Ref Range   Glucose-Capillary 213 (H) 70 - 99 mg/dL  Glucose, capillary     Status: Abnormal   Collection Time: 09/16/14  6:39 AM  Result Value Ref Range   Glucose-Capillary 112 (H) 70 - 99 mg/dL  Glucose, capillary     Status: Abnormal   Collection Time: 09/16/14 11:52 AM  Result Value Ref Range   Glucose-Capillary 117 (H) 70 - 99 mg/dL  Glucose, capillary     Status: Abnormal   Collection Time: 09/16/14  4:39 PM  Result Value Ref Range   Glucose-Capillary 210 (H) 70 - 99 mg/dL  Glucose, capillary     Status: Abnormal   Collection Time: 09/16/14  8:43 PM  Result Value Ref Range   Glucose-Capillary 186 (H) 70 - 99 mg/dL  Glucose, capillary     Status: Abnormal   Collection Time: 09/17/14  7:01 AM  Result Value Ref Range   Glucose-Capillary 109 (H) 70 - 99 mg/dL     HEENT: normal Cardio: RRR and no murmur Resp: CTA B/L and unlabored GI: BS positive  and NT, ND Extremity:  Pulses positive and No Edema Skin:   Intact Neuro: Alert/Oriented, Cranial Nerve II-XII normal, Normal Sensory and Abnormal Motor 3- Left delt bi tri grip, HF, KE 2- ankle DF Musc/Skel:  Normal Gen NAD   Assessment/Plan: 1. Functional deficits secondary to Left hemi secondary to Right Int Capsule infarct which require 3+ hours per day of interdisciplinary therapy in a comprehensive inpatient rehab setting. Physiatrist is providing close team supervision and 24 hour management of active medical problems listed below. Physiatrist and rehab team continue to assess  barriers to discharge/monitor patient progress toward functional and medical goals. Given hx of noncompliance, substance abuse and poor social situation high risk for recurrent admissions for medical issues, will need to coordinate d/c plans with all resources possible Diabetes coordinator consulted [place] [facility] to coordinate .FIM: FIM - Bathing Bathing Steps Patient Completed: Chest, Right Arm, Left Arm, Abdomen, Front perineal area, Buttocks, Right upper leg, Left upper leg, Right lower leg (including foot), Left lower leg (including foot) Bathing: 5: Set-up assist to: Adjust water temp  FIM - Upper Body Dressing/Undressing Upper body dressing/undressing steps patient completed: Thread/unthread right sleeve of pullover shirt/dresss, Thread/unthread left sleeve of pullover shirt/dress, Put head through opening of pull over shirt/dress, Pull shirt over trunk Upper body dressing/undressing: 0: Wears gown/pajamas-no public clothing FIM - Lower Body Dressing/Undressing Lower body dressing/undressing steps patient completed: Thread/unthread right pants leg, Thread/unthread left pants leg, Don/Doff right sock, Don/Doff left sock Lower body dressing/undressing: 2: Max-Patient completed 25-49% of tasks  FIM - Toileting Toileting steps completed by patient: Adjust clothing prior to toileting, Performs perineal hygiene, Adjust clothing after toileting Toileting Assistive Devices: Grab bar or rail for support Toileting: 5: Supervision: Safety issues/verbal cues  FIM - Radio producer Devices: Grab bars, Insurance account manager Transfers: 5-To toilet/BSC: Supervision (verbal cues/safety issues), 5-From toilet/BSC: Supervision (verbal cues/safety issues)  FIM - Control and instrumentation engineer Devices: Walker, Arm rests, Orthosis (L hand orthosis) Bed/Chair Transfer: 5: Bed > Chair or W/C: Supervision (verbal cues/safety  issues), 5: Chair or W/C > Bed: Supervision (verbal cues/safety issues)  FIM - Locomotion: Wheelchair Distance: 200 Locomotion: Wheelchair: 1: Total Assistance/staff pushes wheelchair (Pt<25%) FIM - Locomotion: Ambulation Locomotion: Ambulation Assistive Devices: Walker - Rolling, Orthosis, Other (comment) (L hand orthosis; ACE bandage for compression at R knee) Ambulation/Gait Assistance: 4: Min guard, 4: Min assist Locomotion: Ambulation: 2: Travels 50 - 149 ft with minimal assistance (Pt.>75%)  Comprehension Comprehension Mode: Auditory Comprehension: 6-Follows complex conversation/direction: With extra time/assistive device  Expression Expression Mode: Verbal Expression: 5-Expresses basic needs/ideas: With extra time/assistive device  Social Interaction Social Interaction: 6-Interacts appropriately with others with medication or extra time (anti-anxiety, antidepressant).  Problem Solving Problem Solving: 5-Solves complex 90% of the time/cues < 10% of the time  Memory Memory: 6-Assistive device: No helper  Medical Problem List and Plan: 1. Functional deficits secondary to Right PLIC infarct/CR infarct 2.  DVT Prophylaxis/Anticoagulation: Subcutaneous heparin. Monitor platelet counts or any signs of bleeding 3. Pain Management: Tylenol as needed 4. Dysphagia. Mechanical soft diet. Follow-up speech therapy 5. Neuropsych: This patient is capable of making decisions on her own behalf. 6. Skin/Wound Care: Routine skin checks 7. Fluids/Electrolytes/Nutrition: Strict I and O follow-up chemistries 8. Hypertension. Norvasc 10 mg daily, hydrochlorothiazide 25 mg daily, lisinopril 20 mg daily. Monitor for increased mobility 9. Diabetes mellitus with peripheral neuropathy. Hemoglobin A1c 10.3. Currently with sliding scale insulin. Patient on Glucotrol 10 mg twice  a day and Glucophage 1000 mg twice a day prior to admission but was not taking due to medical compliance and financial  constraints. She will need full diabetic teaching.  Will try to simplify regimen Appreciate diabetes coordinator note, doubt pt will comply with insulin administration.  CBGs improving 10. Hyperlipidemia. Lipitor 11. Medical noncompliance. Documentation of medical noncompliance and financial constraints prior to admission. Provide counseling and social work follow-up 12. History of polysubstance abuse. Counseling  LOS (Days) 6 A FACE TO FACE EVALUATION WAS PERFORMED  Authur Cubit E 09/17/2014, 7:20 AM

## 2014-09-18 ENCOUNTER — Inpatient Hospital Stay (HOSPITAL_COMMUNITY): Payer: Self-pay | Admitting: Physical Therapy

## 2014-09-18 ENCOUNTER — Inpatient Hospital Stay (HOSPITAL_COMMUNITY): Payer: MEDICAID | Admitting: Speech Pathology

## 2014-09-18 ENCOUNTER — Encounter (HOSPITAL_COMMUNITY): Payer: Self-pay | Admitting: Occupational Therapy

## 2014-09-18 LAB — GLUCOSE, CAPILLARY
GLUCOSE-CAPILLARY: 185 mg/dL — AB (ref 70–99)
Glucose-Capillary: 127 mg/dL — ABNORMAL HIGH (ref 70–99)
Glucose-Capillary: 92 mg/dL (ref 70–99)
Glucose-Capillary: 243 mg/dL — ABNORMAL HIGH (ref 70–99)

## 2014-09-18 MED ORDER — MIRTAZAPINE 7.5 MG PO TABS
7.5000 mg | ORAL_TABLET | Freq: Every day | ORAL | Status: DC
  Administered 2014-09-18 – 2014-09-25 (×8): 7.5 mg via ORAL
  Filled 2014-09-18 (×9): qty 1

## 2014-09-18 NOTE — Progress Notes (Signed)
09/17/14  2015 Pam Love PA called for ducolax supp order as pt c/o's full feeling in rectum and unable to pass stool. Order obtained and order for Fleets enema prn. Supp given and did have results.  [facility] RN

## 2014-09-18 NOTE — Progress Notes (Signed)
Speech Language Pathology Weekly Progress and Session Note  Patient Details  Name: Diamond Rice MRN: [id number] Date of Birth: 1970/08/31  Beginning of progress report period: September 12, 2013 End of progress report period: September 18, 2014  Today's Date: 09/18/2014 SLP Individual Time: 1300-1400 SLP Individual Time Calculation (min): 60 min  Short Term Goals: Week 1: SLP Short Term Goal 1 (Week 1): Patient will tolerate trials of advanced consistencies in order to upgrade diet, min A SLP Short Term Goal 1 - Progress (Week 1): Met SLP Short Term Goal 2 (Week 1): Pt will target L-sided weakness by completing oral motor exercises with use of visual aide, min A.  SLP Short Term Goal 2 - Progress (Week 1): Met SLP Short Term Goal 3 (Week 1): Pt will improve speech intelligibility at the phrase level to ~90% accuracy with mod cues for use of dysarthria strategies.  SLP Short Term Goal 3 - Progress (Week 1): Met    New Short Term Goals: Week 2: SLP Short Term Goal 1 (Week 2): Patient will utilize safe swallow strategies with regular textures and thin liquids with Mod I.   SLP Short Term Goal 2 (Week 2): Patient will return demonstration of oral motor exercises with use of visual aide with Mod I.  SLP Short Term Goal 3 (Week 2): Patient will produce intelligible speech at the conversational level with 90% accuracy with Min cues for use of compensatory strategies.   Weekly Progress Updates: Patient has made functional gains and has met 3 out of 3 short term goals this reporting period due to improved swallow function and speech intelligibility.  Currently, patient continues to require Min-Supervision assist for recall and carryover of compensatory strategies for both dysphagia and dysarthria goals.  Patient education is ongoing. Patient would benefit from continued skilled SLP intervention to maximize her functional independence prior to discharge.    Intensity: Minumum of 1-2 x/day, 30 to  90 minutes Frequency: 5 out of 7 days Duration/Length of Stay: 09/26/14 Treatment/Interventions: Cueing hierarchy;Functional tasks;Therapeutic Activities;Internal/external aids;Oral motor exercises;Patient/family education;Dysphagia/aspiration precaution training   Daily Session  Skilled Therapeutic Interventions:  Skilled treatment session focused on addressing dysarthria and dysphagia goals.  Patient consumed trials of regular textures and thin liquids via straw with efficient mastication and no overt s/s of aspiration following SLP set-up assist.  Recommend to initiate a diet upgrade to regular textures and thin liquids with continued set-up assist and intermittent Supervision.  SLP also facilitated session with Min cues to utilize external memory aide for recall of oral motor exercises and perform accurately.  Patient also required Mod cues to identify instances of tense speech with inadequate breath support; however, her intelligibility was only minimally impacted at the phrase-sentence level.  Continue with current plan of care.         FIM:  Comprehension Comprehension Mode: Auditory Comprehension: 6-Follows complex conversation/direction: With extra time/assistive device Expression Expression Mode: Verbal Expression: 5-Expresses basic needs/ideas: With extra time/assistive device Social Interaction Social Interaction: 6-Interacts appropriately with others with medication or extra time (anti-anxiety, antidepressant). Problem Solving Problem Solving: 5-Solves complex 90% of the time/cues < 10% of the time Memory Memory: 5-Recognizes or recalls 90% of the time/requires cueing < 10% of the time FIM - Eating Eating Activity: 6: More than reasonable amount of time;5: Set-up assist for cut food;5: Set-up assist for open containers General    Pain Pain Assessment Pain Assessment: No/denies pain  Therapy/Group: Individual Therapy  Carmelia Roller.,  Graham  Clark Cuff  09/18/2014, 1:22 PM

## 2014-09-18 NOTE — Progress Notes (Signed)
Physical Therapy Session Note  Patient Details  Name: Diamond Rice MRN: [id number] Date of Birth: 02-29-1971  Today's Date: 09/18/2014 PT Individual Time: 0830-0930 PT Individual Time Calculation (min): 60 min   Short Term Goals: Week 1:  PT Short Term Goal 1 (Week 1): Pt will demonstrate transfers w/c to/from bed req SBA.  PT Short Term Goal 2 (Week 1): Pt will ambulate >= 150' with RW req SBA. PT Short Term Goal 3 (Week 1): Pt will ascend/descend >= 5 stairs with 1 or 2 rails req SBA. PT Short Term Goal 4 (Week 1): Pt will demonstrate mod I bed mobility.  PT Short Term Goal 5 (Week 1): Pt will demonstrate car transfer with CGA.   Skilled Therapeutic Interventions/Progress Updates:    Pt received semi reclined in bed; asleep but easily awakened. Agreeable to therapy. Session focused on gait training, increasing proximal stability, and activity tolerance, Pt performed gait 2 x150' in controlled environment with rolling walker, L hand orthosis, and supervision to min guard. During initial gait trial,  L posterior leaf spring [facility]) as effecitve in promoting L ankle dorsiflexion and prevent L genu recurvatum. During subsequent trial, pt required tactile cueing at distal hamstrings for L knee control during L mid-terminal stance and at L hip extensors for increased LLE stance stability. See below for detailed description of NMR. Session ended in pt room, where pt was left seated in recliner with all needs within reach.  Therapy Documentation Precautions:  Precautions Precautions: Fall Precaution Comments: L hemiparesis Restrictions Weight Bearing Restrictions: No Vital Signs: Therapy Vitals Temp: 97.7 F (36.5 C) Temp Source: Oral Pulse Rate: 82 Resp: 18 BP: (!) 168/98 mmHg Patient Position (if appropriate): Lying Oxygen Therapy SpO2: 100 % O2 Device: Not Delivered Pain: Pain Assessment Pain Assessment: No/denies pain  NMR: Focused on increasing proximal stability to  improve stability/independence with standing, functional ambulation. Pt performed the following in tall kneeling with LUE support at [facility] bench: forward-retro gait 2 x5 steps and 1 x3 steps per directions, squats x 10 total. Activity ended secondary to pt c/o discomfort in bilat knees.  See FIM for current functional status  Therapy/Group: Individual Therapy  Hobble, Malva Cogan 09/18/2014, 3:58 PM

## 2014-09-18 NOTE — Progress Notes (Signed)
44 y.o. right handed female with history of hypertension, diabetes mellitus and peripheral neuropathy, polysubstance abuse and medical noncompliance due to financial hardship. Patient recently homeless and has been living with friends recently. Presented 09/08/2014 with left-sided weakness and slurred speech. Patient with recent admission 08/28/2014 for chest pain and elevated blood pressure with negative troponins urine drug screen was positive at that time for marijuana workup unremarkable. MRI of the brain showed small acute infarct posterior limb of the right internal capsule and corona radiata. MRA without major occlusion or stenosis. Recent echocardiogram with ejection fraction of 65% left ventricular diastolic dysfunction. Carotid Dopplers with no ICA stenosis. Patient did not receive TPA. Neurology services consulted maintained on aspirin for CVA prophylaxis   Subjective/Complaints: Int numbness R hand responds to shaking Reviewed Neuropsych note Review of Systems - Negative except occ back pain   Objective: Vital Signs: Blood pressure 130/88, pulse 85, temperature 98.2 F (36.8 C), temperature source Oral, resp. rate 16, weight 105.461 kg (232 lb 8 oz), last menstrual period 08/28/2014, SpO2 100 %. No results found. Results for orders placed or performed during the hospital encounter of 09/11/14 (from the past 72 hour(s))  Glucose, capillary     Status: Abnormal   Collection Time: 09/15/14  6:28 AM  Result Value Ref Range   Glucose-Capillary 144 (H) 70 - 99 mg/dL  Glucose, capillary     Status: None   Collection Time: 09/15/14 11:58 AM  Result Value Ref Range   Glucose-Capillary 85 70 - 99 mg/dL   Comment 1 Notify RN   Glucose, capillary     Status: Abnormal   Collection Time: 09/15/14  4:40 PM  Result Value Ref Range   Glucose-Capillary 187 (H) 70 - 99 mg/dL  Glucose, capillary     Status: Abnormal   Collection Time: 09/15/14  8:29 PM  Result Value Ref Range   Glucose-Capillary  213 (H) 70 - 99 mg/dL  Glucose, capillary     Status: Abnormal   Collection Time: 09/16/14  6:39 AM  Result Value Ref Range   Glucose-Capillary 112 (H) 70 - 99 mg/dL  Glucose, capillary     Status: Abnormal   Collection Time: 09/16/14 11:52 AM  Result Value Ref Range   Glucose-Capillary 117 (H) 70 - 99 mg/dL  Glucose, capillary     Status: Abnormal   Collection Time: 09/16/14  4:39 PM  Result Value Ref Range   Glucose-Capillary 210 (H) 70 - 99 mg/dL  Glucose, capillary     Status: Abnormal   Collection Time: 09/16/14  8:43 PM  Result Value Ref Range   Glucose-Capillary 186 (H) 70 - 99 mg/dL  Glucose, capillary     Status: Abnormal   Collection Time: 09/17/14  7:01 AM  Result Value Ref Range   Glucose-Capillary 109 (H) 70 - 99 mg/dL  Glucose, capillary     Status: None   Collection Time: 09/17/14 11:58 AM  Result Value Ref Range   Glucose-Capillary 76 70 - 99 mg/dL  Glucose, capillary     Status: Abnormal   Collection Time: 09/17/14  5:05 PM  Result Value Ref Range   Glucose-Capillary 389 (H) 70 - 99 mg/dL  Glucose, capillary     Status: Abnormal   Collection Time: 09/17/14 10:02 PM  Result Value Ref Range   Glucose-Capillary 149 (H) 70 - 99 mg/dL     HEENT: normal Cardio: RRR and no murmur Resp: CTA B/L and unlabored GI: BS positive and NT, ND Extremity:  Pulses positive  and No Edema Skin:   Intact Neuro: Alert/Oriented, Cranial Nerve II-XII normal, Normal Sensory and Abnormal Motor 3- Left delt bi tri grip, HF, KE 2- ankle DF Musc/Skel:  Normal Gen NAD   Assessment/Plan: 1. Functional deficits secondary to Left hemi secondary to Right Int Capsule infarct which require 3+ hours per day of interdisciplinary therapy in a comprehensive inpatient rehab setting. Physiatrist is providing close team supervision and 24 hour management of active medical problems listed below. Physiatrist and rehab team continue to assess barriers to discharge/monitor patient progress toward  functional and medical goals. Given hx of noncompliance, substance abuse and poor social situation high risk for recurrent admissions for medical issues, will need to coordinate d/c plans with all resources possible Diabetes coordinator consulted [place] [facility] Based on neuropsych rec Behavioral health follow up     SW to coordinate .FIM: FIM - Bathing Bathing Steps Patient Completed: Chest, Right Arm, Left Arm, Abdomen, Front perineal area, Buttocks, Right upper leg, Left upper leg, Right lower leg (including foot), Left lower leg (including foot) Bathing: 5: Set-up assist to: Adjust water temp  FIM - Upper Body Dressing/Undressing Upper body dressing/undressing steps patient completed: Thread/unthread right sleeve of pullover shirt/dresss, Thread/unthread left sleeve of pullover shirt/dress, Put head through opening of pull over shirt/dress, Pull shirt over trunk Upper body dressing/undressing: 5: Supervision: Safety issues/verbal cues FIM - Lower Body Dressing/Undressing Lower body dressing/undressing steps patient completed: Thread/unthread right pants leg, Thread/unthread left pants leg, Don/Doff right sock, Don/Doff left sock, Don/Doff right shoe, Don/Doff left shoe, Fasten/unfasten right shoe, Fasten/unfasten left shoe, Pull pants up/down Lower body dressing/undressing: 5: Supervision: Safety issues/verbal cues  FIM - Toileting Toileting steps completed by patient: Performs perineal hygiene (wearing only hosp gown) Toileting Assistive Devices: Grab bar or rail for support Toileting: 5: Supervision: Safety issues/verbal cues  FIM - Radio producer Devices: Insurance account manager Transfers: 4-To toilet/BSC: Min A (steadying Pt. > 75%)  FIM - Bed/Chair Transfer Bed/Chair Transfer Assistive Devices: Walker, Arm rests Bed/Chair Transfer: 3: Supine > Sit: Mod A (lifting assist/Pt. 50-74%/lift 2 legs  FIM - Locomotion:  Wheelchair Distance: 200 Locomotion: Wheelchair: 1: Total Assistance/staff pushes wheelchair (Pt<25%) FIM - Locomotion: Ambulation Locomotion: Ambulation Assistive Devices: Walker - Rolling, Orthosis, Other (comment) (R HHA then walker with L hand orthosis) Ambulation/Gait Assistance: 4: Min guard, 4: Min assist, 5: Supervision Locomotion: Ambulation: 2: Travels 50 - 149 ft with minimal assistance (Pt.>75%)  Comprehension Comprehension Mode: Auditory Comprehension: 6-Follows complex conversation/direction: With extra time/assistive device  Expression Expression Mode: Verbal Expression: 5-Expresses complex 90% of the time/cues < 10% of the time  Social Interaction Social Interaction: 6-Interacts appropriately with others with medication or extra time (anti-anxiety, antidepressant).  Problem Solving Problem Solving: 5-Solves basic 90% of the time/requires cueing < 10% of the time  Memory Memory: 6-Assistive device: No helper  Medical Problem List and Plan: 1. Functional deficits secondary to Right PLIC infarct/CR infarct 2.  DVT Prophylaxis/Anticoagulation: Subcutaneous heparin. Monitor platelet counts or any signs of bleeding 3. Pain Management: Tylenol as needed 4. Dysphagia. Mechanical soft diet. Follow-up speech therapy 5. Neuropsych: This patient is capable of making decisions on her own behalf. 6. Skin/Wound Care: Routine skin checks 7. Fluids/Electrolytes/Nutrition: Strict I and O follow-up chemistries 8. Hypertension. Norvasc 10 mg daily, hydrochlorothiazide 25 mg daily, lisinopril 20 mg daily. Monitor for increased mobility 9. Diabetes mellitus with peripheral neuropathy. Hemoglobin A1c 10.3. Currently with sliding scale insulin. Patient on CBGs improving on amaryl  and carb mod diet Will try to simplify regimen Appreciate diabetes coordinator note, doubt pt will comply with insulin administration.  CBGs improving 10. Hyperlipidemia. Lipitor 11. Medical noncompliance.  Documentation of medical noncompliance and financial constraints prior to admission. Provide counseling and social work follow-up 12. History of polysubstance abuse. Counseling 13.  Depression anxiety and poor sleep , trial remeron LOS (Days) 7 A FACE TO FACE EVALUATION WAS PERFORMED  Yoltzin Ransom E 09/18/2014, 6:04 AM

## 2014-09-18 NOTE — Progress Notes (Signed)
Occupational Therapy Weekly Progress Note  Patient Details  Name: Diamond Rice MRN: [id number] Date of Birth: 22-Dec-1970  Beginning of progress report period: September 12, 2014 End of progress report period: September 18, 2014  Today's Date: 09/18/2014 OT Individual Time: 0930-1030 OT Individual Time Calculation (min): 60 min    Patient has met 4 of 4 short term goals.  Pt is making steady progress towards goals.  She currently requires supervision with use of RW with mobility and transfers.  Pt continues to require increased time, effort, and cues to incorporate LUE into functional tasks due to decreased grasp and pinch.    Patient continues to demonstrate the following deficits: LUE weakness, decreased coordination, decreased sequencing, decreased standing balance and therefore will continue to benefit from skilled OT intervention to enhance overall performance with BADL, iADL and Reduce care partner burden.  Patient progressing toward long term goals..  Continue plan of care.  OT Short Term Goals Week 1:  OT Short Term Goal 1 (Week 1): Pt will complete bathing with supervision at sit > stand level OT Short Term Goal 1 - Progress (Week 1): Met OT Short Term Goal 2 (Week 1): Pt will complete toilet transfers with supervision with LRAD OT Short Term Goal 2 - Progress (Week 1): Met OT Short Term Goal 3 (Week 1): Pt will complete LB dressing wtih min assist OT Short Term Goal 3 - Progress (Week 1): Met OT Short Term Goal 4 (Week 1): Pt will utilize LUE as gross assist with self-feeding tasks with min verbal cues OT Short Term Goal 4 - Progress (Week 1): Met Week 2:  OT Short Term Goal 1 (Week 2): STG = LTGs due to remaining LOS  Skilled Therapeutic Interventions/Progress Updates:    Engaged in ADL retraining with focus on functional mobility, standing balance, and functional use of LUE.  Pt ambulated with RW with supervision to obtain clothing, opting to skip the shower this session and  just get dressed.  Pt able utilize shoe buttons to fasten shoes this session.  Grooming completed in standing at sink with supervision.  In therapy gym engaged in [place] tasks in sitting with focus on grasp and pinch to increase success with opening containers for grooming and eating.  Pt continues to require occasional assist with smaller packets due to decreased pinch strength.  Pt attempts to compensate for decreased grasp and pinch with shoulder movements. At end of session pt requested to lie down due to decreased sleep overnight.  Therapy Documentation Precautions:  Precautions Precautions: Fall Precaution Comments: L hemiparesis Restrictions Weight Bearing Restrictions: No General:   Vital Signs: Therapy Vitals BP: (!) 146/90 mmHg Pain: Pain Assessment Pain Score: 7  Pain Type: Chronic pain Pain Location: Back Pain Orientation: Lower Pain Descriptors / Indicators: Aching Pain Frequency: Intermittent Pain Intervention(s): Repositioned  See FIM for current functional status  Therapy/Group: Individual Therapy  Simonne Come 09/18/2014, 10:48 AM

## 2014-09-19 ENCOUNTER — Inpatient Hospital Stay (HOSPITAL_COMMUNITY): Payer: Self-pay | Admitting: Physical Therapy

## 2014-09-19 LAB — GLUCOSE, CAPILLARY
GLUCOSE-CAPILLARY: 109 mg/dL — AB (ref 70–99)
GLUCOSE-CAPILLARY: 176 mg/dL — AB (ref 70–99)
Glucose-Capillary: 143 mg/dL — ABNORMAL HIGH (ref 70–99)
Glucose-Capillary: 75 mg/dL (ref 70–99)

## 2014-09-19 NOTE — Progress Notes (Signed)
44 y.o. right handed female with history of hypertension, diabetes mellitus and peripheral neuropathy, polysubstance abuse and medical noncompliance due to financial hardship. Patient recently homeless and has been living with friends recently. Presented 09/08/2014 with left-sided weakness and slurred speech. Patient with recent admission 08/28/2014 for chest pain and elevated blood pressure with negative troponins urine drug screen was positive at that time for marijuana workup unremarkable. MRI of the brain showed small acute infarct posterior limb of the right internal capsule and corona radiata. MRA without major occlusion or stenosis. Recent echocardiogram with ejection fraction of 65% left ventricular diastolic dysfunction. Carotid Dopplers with no ICA stenosis. Patient did not receive TPA. Neurology services consulted maintained on aspirin for CVA prophylaxis   Subjective/Complaints: No new issues. Slept well. Reviewed Neuropsych note Review of Systems - Negative except occ back pain   Objective: Vital Signs: Blood pressure 131/84, pulse 70, temperature 98.2 F (36.8 C), temperature source Oral, resp. rate 18, weight 105.461 kg (232 lb 8 oz), last menstrual period 08/28/2014, SpO2 98 %. No results found. Results for orders placed or performed during the hospital encounter of 09/11/14 (from the past 72 hour(s))  Glucose, capillary     Status: Abnormal   Collection Time: 09/16/14 11:52 AM  Result Value Ref Range   Glucose-Capillary 117 (H) 70 - 99 mg/dL  Glucose, capillary     Status: Abnormal   Collection Time: 09/16/14  4:39 PM  Result Value Ref Range   Glucose-Capillary 210 (H) 70 - 99 mg/dL  Glucose, capillary     Status: Abnormal   Collection Time: 09/16/14  8:43 PM  Result Value Ref Range   Glucose-Capillary 186 (H) 70 - 99 mg/dL  Glucose, capillary     Status: Abnormal   Collection Time: 09/17/14  7:01 AM  Result Value Ref Range   Glucose-Capillary 109 (H) 70 - 99 mg/dL   Glucose, capillary     Status: None   Collection Time: 09/17/14 11:58 AM  Result Value Ref Range   Glucose-Capillary 76 70 - 99 mg/dL  Glucose, capillary     Status: Abnormal   Collection Time: 09/17/14  5:05 PM  Result Value Ref Range   Glucose-Capillary 389 (H) 70 - 99 mg/dL  Glucose, capillary     Status: Abnormal   Collection Time: 09/17/14 10:02 PM  Result Value Ref Range   Glucose-Capillary 149 (H) 70 - 99 mg/dL  Glucose, capillary     Status: Abnormal   Collection Time: 09/18/14  6:54 AM  Result Value Ref Range   Glucose-Capillary 127 (H) 70 - 99 mg/dL  Glucose, capillary     Status: None   Collection Time: 09/18/14 11:24 AM  Result Value Ref Range   Glucose-Capillary 92 70 - 99 mg/dL   Comment 1 Notify RN   Glucose, capillary     Status: Abnormal   Collection Time: 09/18/14  4:26 PM  Result Value Ref Range   Glucose-Capillary 185 (H) 70 - 99 mg/dL   Comment 1 Notify RN   Glucose, capillary     Status: Abnormal   Collection Time: 09/18/14  7:58 PM  Result Value Ref Range   Glucose-Capillary 243 (H) 70 - 99 mg/dL  Glucose, capillary     Status: Abnormal   Collection Time: 09/19/14  6:47 AM  Result Value Ref Range   Glucose-Capillary 109 (H) 70 - 99 mg/dL     HEENT: normal Cardio: RRR and no murmur Resp: CTA B/L and unlabored GI: BS positive and NT,  ND Extremity:  Pulses positive and No Edema Skin:   Intact Neuro: Alert/Oriented, Cranial Nerve II-XII normal, Normal Sensory and Abnormal Motor 3- Left delt bi tri grip, HF, KE 2- ankle DF Musc/Skel:  Normal Gen NAD   Assessment/Plan: 1. Functional deficits secondary to Left hemi secondary to Right Int Capsule infarct which require 3+ hours per day of interdisciplinary therapy in a comprehensive inpatient rehab setting. Physiatrist is providing close team supervision and 24 hour management of active medical problems listed below. Physiatrist and rehab team continue to assess barriers to discharge/monitor patient  progress toward functional and medical goals.  Marland KitchenFIM: FIM - Bathing Bathing Steps Patient Completed: Chest, Right Arm, Left Arm, Abdomen, Front perineal area, Buttocks, Right upper leg, Left upper leg, Right lower leg (including foot), Left lower leg (including foot) Bathing: 5: Set-up assist to: Adjust water temp  FIM - Upper Body Dressing/Undressing Upper body dressing/undressing steps patient completed: Thread/unthread right sleeve of pullover shirt/dresss, Thread/unthread left sleeve of pullover shirt/dress, Put head through opening of pull over shirt/dress, Pull shirt over trunk Upper body dressing/undressing: 5: Supervision: Safety issues/verbal cues FIM - Lower Body Dressing/Undressing Lower body dressing/undressing steps patient completed: Thread/unthread right pants leg, Thread/unthread left pants leg, Don/Doff right sock, Don/Doff left sock, Don/Doff right shoe, Don/Doff left shoe, Fasten/unfasten right shoe, Fasten/unfasten left shoe, Pull pants up/down Lower body dressing/undressing: 5: Supervision: Safety issues/verbal cues  FIM - Toileting Toileting steps completed by patient: Adjust clothing prior to toileting, Performs perineal hygiene, Adjust clothing after toileting Toileting Assistive Devices: Grab bar or rail for support Toileting: 5: Supervision: Safety issues/verbal cues  FIM - Radio producer Devices: Insurance account manager Transfers: 5-To toilet/BSC: Supervision (verbal cues/safety issues)  FIM - Control and instrumentation engineer Devices: Environmental consultant, Arm rests Bed/Chair Transfer: 4: Bed > Chair or W/C: Min A (steadying Pt. > 75%)  FIM - Locomotion: Wheelchair Distance: 200 Locomotion: Wheelchair: 1: Total Assistance/staff pushes wheelchair (Pt<25%) FIM - Locomotion: Ambulation Locomotion: Ambulation Assistive Devices: Environmental consultant - Rolling, Orthosis, Other (comment) Ambulation/Gait Assistance: 4: Min guard, 4: Min assist Locomotion:  Ambulation: 4: Travels 150 ft or more with minimal assistance (Pt.>75%)  Comprehension Comprehension Mode: Auditory Comprehension: 6-Follows complex conversation/direction: With extra time/assistive device  Expression Expression Mode: Verbal Expression: 5-Expresses basic needs/ideas: With extra time/assistive device  Social Interaction Social Interaction: 6-Interacts appropriately with others with medication or extra time (anti-anxiety, antidepressant).  Problem Solving Problem Solving: 5-Solves complex 90% of the time/cues < 10% of the time  Memory Memory: 5-Recognizes or recalls 90% of the time/requires cueing < 10% of the time  Medical Problem List and Plan: 1. Functional deficits secondary to Right PLIC infarct/CR infarct 2.  DVT Prophylaxis/Anticoagulation: Subcutaneous heparin. Monitor platelet counts or any signs of bleeding 3. Pain Management: Tylenol as needed 4. Dysphagia. Mechanical soft diet. Follow-up speech therapy 5. Neuropsych: This patient is capable of making decisions on her own behalf.  -Behavior health follow up 6. Skin/Wound Care: Routine skin checks 7. Fluids/Electrolytes/Nutrition: Strict I and O follow-up chemistries 8. Hypertension. Norvasc 10 mg daily, hydrochlorothiazide 25 mg daily, lisinopril 20 mg daily. Monitor with increased mobility 9. Diabetes mellitus with peripheral neuropathy. Hemoglobin A1c 10.3. Currently with sliding scale insulin. Patient on CBGs improving on amaryl and carb mod diet although numbers variable.  -Appreciate diabetes coordinator note, doubt pt will comply with insulin administration.    10. Hyperlipidemia. Lipitor 11. Medical noncompliance. Documentation of medical noncompliance and financial constraints prior to admission. Provide counseling and social work follow-up  12. History of polysubstance abuse. Counseling 13.  Depression anxiety and poor sleep , trial remeron LOS (Days) 8 A FACE TO FACE EVALUATION WAS  PERFORMED  Janett Kamath T 09/19/2014, 10:26 AM

## 2014-09-20 ENCOUNTER — Encounter (HOSPITAL_COMMUNITY): Payer: Self-pay

## 2014-09-20 ENCOUNTER — Inpatient Hospital Stay (HOSPITAL_COMMUNITY): Payer: Self-pay | Admitting: *Deleted

## 2014-09-20 LAB — GLUCOSE, CAPILLARY
GLUCOSE-CAPILLARY: 95 mg/dL (ref 70–99)
Glucose-Capillary: 101 mg/dL — ABNORMAL HIGH (ref 70–99)
Glucose-Capillary: 105 mg/dL — ABNORMAL HIGH (ref 70–99)
Glucose-Capillary: 193 mg/dL — ABNORMAL HIGH (ref 70–99)

## 2014-09-20 NOTE — Progress Notes (Signed)
Physical Therapy Session Note  Patient Details  Name: Diamond Rice MRN: [id number] Date of Birth: [birth year]  Today's Date: 09/20/2014 PT Individual Time: 1300-1400 PT Individual Time Calculation (min): 60 min    Skilled Therapeutic Interventions/Progress Updates:  Patient in bed with complains of cramps and abdominal pain, however she does want to participate in therapy. Assisted in transfers to the bathroom with SBA for safety. Gait Training with RW with SBA to the gym and back with one rest break.NuStep x 10 min with no rest break , level 4.  Stair training with SBa x 8 steps.  Patient returned to room with all needs within reach.    Therapy Documentation Precautions:  Precautions Precautions: Fall Precaution Comments: L hemiparesis Restrictions Weight Bearing Restrictions: No Vital Signs: Therapy Vitals Temp: 98.3 F (36.8 C) Temp Source: Oral Pulse Rate: 83 Resp: 18 BP: (!) 155/92 mmHg (RN notified) Patient Position (if appropriate): Lying Oxygen Therapy SpO2: 98 % O2 Device: Not Delivered Pain: Pain Assessment Pain Score: 0-No pain Pain Location: Shoulder Pain Intervention(s): Medication (See eMAR)  See FIM for current functional status  Therapy/Group: Individual Therapy  Guadlupe Spanish 09/20/2014, 3:36 PM

## 2014-09-20 NOTE — Progress Notes (Signed)
44 y.o. right handed female with history of hypertension, diabetes mellitus and peripheral neuropathy, polysubstance abuse and medical noncompliance due to financial hardship. Patient recently homeless and has been living with friends recently. Presented 09/08/2014 with left-sided weakness and slurred speech. Patient with recent admission 08/28/2014 for chest pain and elevated blood pressure with negative troponins urine drug screen was positive at that time for marijuana workup unremarkable. MRI of the brain showed small acute infarct posterior limb of the right internal capsule and corona radiata. MRA without major occlusion or stenosis. Recent echocardiogram with ejection fraction of 65% left ventricular diastolic dysfunction. Carotid Dopplers with no ICA stenosis. Patient did not receive TPA. Neurology services consulted maintained on aspirin for CVA prophylaxis   Subjective/Complaints: No complaints. Sleeping well. Uneventful weekend. Asleep in room upon my arrival. Review of Systems - Negative except occ back pain   Objective: Vital Signs: Blood pressure 122/86, pulse 67, temperature 97.5 F (36.4 C), temperature source Oral, resp. rate 18, weight 105.461 kg (232 lb 8 oz), last menstrual period 08/28/2014, SpO2 100 %. No results found. Results for orders placed or performed during the hospital encounter of 09/11/14 (from the past 72 hour(s))  Glucose, capillary     Status: None   Collection Time: 09/17/14 11:58 AM  Result Value Ref Range   Glucose-Capillary 76 70 - 99 mg/dL  Glucose, capillary     Status: Abnormal   Collection Time: 09/17/14  5:05 PM  Result Value Ref Range   Glucose-Capillary 389 (H) 70 - 99 mg/dL  Glucose, capillary     Status: Abnormal   Collection Time: 09/17/14 10:02 PM  Result Value Ref Range   Glucose-Capillary 149 (H) 70 - 99 mg/dL  Glucose, capillary     Status: Abnormal   Collection Time: 09/18/14  6:54 AM  Result Value Ref Range   Glucose-Capillary 127 (H)  70 - 99 mg/dL  Glucose, capillary     Status: None   Collection Time: 09/18/14 11:24 AM  Result Value Ref Range   Glucose-Capillary 92 70 - 99 mg/dL   Comment 1 Notify RN   Glucose, capillary     Status: Abnormal   Collection Time: 09/18/14  4:26 PM  Result Value Ref Range   Glucose-Capillary 185 (H) 70 - 99 mg/dL   Comment 1 Notify RN   Glucose, capillary     Status: Abnormal   Collection Time: 09/18/14  7:58 PM  Result Value Ref Range   Glucose-Capillary 243 (H) 70 - 99 mg/dL  Glucose, capillary     Status: Abnormal   Collection Time: 09/19/14  6:47 AM  Result Value Ref Range   Glucose-Capillary 109 (H) 70 - 99 mg/dL  Glucose, capillary     Status: Abnormal   Collection Time: 09/19/14 11:14 AM  Result Value Ref Range   Glucose-Capillary 143 (H) 70 - 99 mg/dL  Glucose, capillary     Status: None   Collection Time: 09/19/14  4:57 PM  Result Value Ref Range   Glucose-Capillary 75 70 - 99 mg/dL  Glucose, capillary     Status: Abnormal   Collection Time: 09/19/14  8:00 PM  Result Value Ref Range   Glucose-Capillary 176 (H) 70 - 99 mg/dL  Glucose, capillary     Status: Abnormal   Collection Time: 09/20/14  6:48 AM  Result Value Ref Range   Glucose-Capillary 101 (H) 70 - 99 mg/dL     HEENT: normal Cardio: RRR and no murmur Resp: CTA B/L and unlabored GI: BS  positive and NT, ND Extremity:  Pulses positive and No Edema Skin:   Intact Neuro: Alert/Oriented, Cranial Nerve II-XII normal, Normal Sensory and Abnormal Motor 3- Left delt bi tri grip, HF, KE 2- ankle DF Musc/Skel:  Normal Gen NAD   Assessment/Plan: 1. Functional deficits secondary to Left hemi secondary to Right Int Capsule infarct which require 3+ hours per day of interdisciplinary therapy in a comprehensive inpatient rehab setting. Physiatrist is providing close team supervision and 24 hour management of active medical problems listed below. Physiatrist and rehab team continue to assess barriers to  discharge/monitor patient progress toward functional and medical goals.  Marland KitchenFIM: FIM - Bathing Bathing Steps Patient Completed:  (refused) Bathing: 0: Activity did not occur  FIM - Upper Body Dressing/Undressing Upper body dressing/undressing steps patient completed: Thread/unthread right sleeve of pullover shirt/dresss, Thread/unthread left sleeve of pullover shirt/dress, Put head through opening of pull over shirt/dress, Pull shirt over trunk Upper body dressing/undressing: 0: Wears gown/pajamas-no public clothing FIM - Lower Body Dressing/Undressing Lower body dressing/undressing steps patient completed: Thread/unthread right pants leg, Thread/unthread left pants leg, Don/Doff right sock, Don/Doff left sock, Don/Doff right shoe, Don/Doff left shoe, Fasten/unfasten right shoe, Fasten/unfasten left shoe, Pull pants up/down Lower body dressing/undressing: 0: Wears gown/pajamas-no public clothing  FIM - Toileting Toileting steps completed by patient: Adjust clothing prior to toileting, Performs perineal hygiene, Adjust clothing after toileting Toileting Assistive Devices: Grab bar or rail for support Toileting: 5: Supervision: Safety issues/verbal cues  FIM - Radio producer Devices: Insurance account manager Transfers: 5-To toilet/BSC: Supervision (verbal cues/safety issues), 5-From toilet/BSC: Supervision (verbal cues/safety issues)  FIM - Control and instrumentation engineer Devices: Copy: 6: Supine > Sit: No assist  FIM - Locomotion: Wheelchair Distance: 200 Locomotion: Wheelchair: 1: Total Assistance/staff pushes wheelchair (Pt<25%) FIM - Locomotion: Ambulation Locomotion: Ambulation Assistive Devices: Environmental consultant - Rolling, Orthosis, Other (comment) Ambulation/Gait Assistance: 4: Min guard, 4: Min assist Locomotion: Ambulation: 4: Travels 150 ft or more with minimal assistance (Pt.>75%)  Comprehension Comprehension Mode:  Auditory Comprehension: 6-Follows complex conversation/direction: With extra time/assistive device  Expression Expression Mode: Verbal Expression: 5-Expresses basic needs/ideas: With extra time/assistive device  Social Interaction Social Interaction: 5-Interacts appropriately 90% of the time - Needs monitoring or encouragement for participation or interaction.  Problem Solving Problem Solving: 5-Solves complex 90% of the time/cues < 10% of the time  Memory Memory: 5-Recognizes or recalls 90% of the time/requires cueing < 10% of the time  Medical Problem List and Plan: 1. Functional deficits secondary to Right PLIC infarct/CR infarct 2.  DVT Prophylaxis/Anticoagulation: Subcutaneous heparin. Monitor platelet counts or any signs of bleeding 3. Pain Management: Tylenol as needed 4. Dysphagia. Mechanical soft diet. Follow-up speech therapy 5. Neuropsych: This patient is capable of making decisions on her own behalf.  -Behavior health follow up 6. Skin/Wound Care: Routine skin checks 7. Fluids/Electrolytes/Nutrition: Strict I and O follow-up chemistries 8. Hypertension. Norvasc 10 mg daily, hydrochlorothiazide 25 mg daily, lisinopril 20 mg daily. Monitor with increased mobility 9. Diabetes mellitus with peripheral neuropathy. Hemoglobin A1c 10.3. Currently with sliding scale insulin. Patient on CBGs improving on amaryl and carb mod diet although numbers can be variable.  -Appreciate diabetes coordinator note, doubt pt will comply with insulin administration.    -sugars improving  10. Hyperlipidemia. Lipitor 11. Medical noncompliance. Documentation of medical noncompliance and financial constraints prior to admission. Provide counseling and social work follow-up 12. History of polysubstance abuse. Counseling 13.  Depression anxiety and poor sleep , trial remeron  LOS (Days) 9 A FACE TO FACE EVALUATION WAS PERFORMED  Reet Scharrer T 09/20/2014, 8:23 AM

## 2014-09-20 NOTE — Progress Notes (Signed)
Occupational Therapy Session Note  Patient Details  Name: Diamond Rice MRN: [id number] Date of Birth: Sep 25, 1970  Today's Date: 09/20/2014 OT Individual Time: 1015-1100 OT Individual Time Calculation (min): 45 min    Short Term Goals: Week 2:  OT Short Term Goal 1 (Week 2): STG = LTGs due to remaining LOS  Skilled Therapeutic Interventions/Progress Updates:    Pt seen for ADL retraining with focus on L NMR, standing balance, and functional mobility. Pt received supine in bed requiring mod cues of encouragement for participation due to menstrual pain. Began session sitting EOB for L NMR activities using clothes pens to place on vertical and horizontal target. Utilized yellow clothes pens with increased time and focus on isolating hand and wrist movement vs compensating with shoulder movements. Pt ambulated to sink with RW at supervision level to engage in grooming tasks with min cues for use of LUE at gross assist level. Pt requesting to shower, therefore ambulated to bathroom at supervision level. Completed bathing at supervision level then donned gown due to pt requesting to return to bed. Pt left supine in bed with all needs in reach.   Therapy Documentation Precautions:  Precautions Precautions: Fall Precaution Comments: L hemiparesis Restrictions Weight Bearing Restrictions: No General:   Vital Signs:   Pain: Pt reporting 10/10 menstrual pain.   See FIM for current functional status  Therapy/Group: Individual Therapy  Duayne Cal 09/20/2014, 12:16 PM

## 2014-09-21 ENCOUNTER — Inpatient Hospital Stay (HOSPITAL_COMMUNITY): Payer: Self-pay | Admitting: *Deleted

## 2014-09-21 ENCOUNTER — Inpatient Hospital Stay (HOSPITAL_COMMUNITY): Payer: Self-pay

## 2014-09-21 ENCOUNTER — Inpatient Hospital Stay (HOSPITAL_COMMUNITY): Payer: Self-pay | Admitting: Physical Therapy

## 2014-09-21 ENCOUNTER — Inpatient Hospital Stay (HOSPITAL_COMMUNITY): Payer: MEDICAID | Admitting: *Deleted

## 2014-09-21 DIAGNOSIS — R0781 Pleurodynia: Secondary | ICD-10-CM

## 2014-09-21 LAB — GLUCOSE, CAPILLARY
GLUCOSE-CAPILLARY: 147 mg/dL — AB (ref 70–99)
GLUCOSE-CAPILLARY: 142 mg/dL — AB (ref 70–99)
Glucose-Capillary: 145 mg/dL — ABNORMAL HIGH (ref 70–99)
Glucose-Capillary: 126 mg/dL — ABNORMAL HIGH (ref 70–99)

## 2014-09-21 MED ORDER — LIDOCAINE 5 % EX PTCH
1.0000 | MEDICATED_PATCH | CUTANEOUS | Status: DC
  Administered 2014-09-21 – 2014-09-26 (×5): 1 via TRANSDERMAL
  Filled 2014-09-21 (×8): qty 1

## 2014-09-21 MED ORDER — ALPRAZOLAM 0.25 MG PO TABS
0.2500 mg | ORAL_TABLET | Freq: Three times a day (TID) | ORAL | Status: DC | PRN
  Administered 2014-09-21 – 2014-09-25 (×3): 0.25 mg via ORAL
  Filled 2014-09-21 (×3): qty 1

## 2014-09-21 MED ORDER — TRAMADOL HCL 50 MG PO TABS
50.0000 mg | ORAL_TABLET | Freq: Once | ORAL | Status: AC
  Administered 2014-09-21: 50 mg via ORAL
  Filled 2014-09-21: qty 1

## 2014-09-21 MED ORDER — OXYCODONE HCL 5 MG PO TABS
5.0000 mg | ORAL_TABLET | Freq: Once | ORAL | Status: AC
  Administered 2014-09-21: 5 mg via ORAL
  Filled 2014-09-21: qty 1

## 2014-09-21 NOTE — Progress Notes (Signed)
Physical Therapy Note  Patient Details  Name: Diamond Rice MRN: [id number] Date of Birth: [birth year] Today's Date: 09/21/2014    Pt missing 60 minutes of skilled physical therapy at this time, as pt declined due to pain in R ribcage. Spoke with RN concerning ongoing pain, missed therapy time.   Stefano Gaul 09/21/2014, 1:14 PM

## 2014-09-21 NOTE — Progress Notes (Signed)
Physical Therapy Note  Patient Details  Name: Diamond Rice MRN: [id number] Date of Birth: 07/01/1971 Today's Date: 09/21/2014  Patient missed 30 minutes of skilled physical therapy this AM secondary to c/o flank/rib pain. Followed up with patient's RN who states patient has had all medications at this time and that patient was unable to participate in OT session earlier this AM. RN also reports patient is scheduled for chest x-ray. Will follow up as able.   [place]estone Fardeen Steinberger, PT, DPT 09/21/2014, 9:36 AM

## 2014-09-21 NOTE — Progress Notes (Signed)
Speech Language Pathology Daily Session Note  Patient Details  Name: Diamond Rice MRN: [id number] Date of Birth: October 27, 1970  Today's Date: 09/21/2014 SLP Individual Time: 1445-1515 SLP Individual Time Calculation (min): 30 min  Short Term Goals: Week 2: SLP Short Term Goal 1 (Week 2): Patient will utilize safe swallow strategies with regular textures and thin liquids with Mod I.   SLP Short Term Goal 2 (Week 2): Patient will return demonstration of oral motor exercises with use of visual aide with Mod I.  SLP Short Term Goal 3 (Week 2): Patient will produce intelligible speech at the conversational level with 90% accuracy with Min cues for use of compensatory strategies.  SLP Short Term Goal 4 (Week 2): Patient will return demonstration of 2 vocal hygiene techniques with Mod I   Skilled Therapeutic Interventions: Treatment focused on dysphagia and communication goals. Patient able to independently verbalize and utilize all dysphagia compensatory strategies during po intake of liquids today. Engaged patient in a oral reading task. Patient with hoarse vocal quality at baseline which worsens with verbal communication post 1-2 words with phonation becoming notably more hoarse/strained, with lower intensity, and with higher pitch. Various compensatory strategies attempted including pursed lip breathing for relaxation, diaphragmatic breathing techniques, varying intensity, and limiting words per breath. Moderate-max verbal and visual instruction provided to also attempt to decrease pitch which was the only technique effective at minimally improving vocal quality. Question origin of impairement as SOB did not appear to be a barrier during other physical activities observed today. Question vocal cord dysfunction vs impact of possible GER as patient also reports other GER symptoms including globus, frequent throat clearing, expectoration of clear saliva. SLP will continue to f/u. Encouraged patient to  consume room temperature or warm liquids during conversation and continue to utilize general speech intelligibility strategies. Question if trial of PPI and/or ENT consult may be additionally helpful at this time to more clearly direct speech goals.    FIM:  Comprehension Comprehension Mode: Auditory Comprehension: 6-Follows complex conversation/direction: With extra time/assistive device Expression Expression Mode: Verbal Expression: 5-Expresses basic needs/ideas: With extra time/assistive device Social Interaction Social Interaction: 5-Interacts appropriately 90% of the time - Needs monitoring or encouragement for participation or interaction. Problem Solving Problem Solving: 5-Solves basic problems: With no assist Memory Memory: 5-Recognizes or recalls 90% of the time/requires cueing < 10% of the time  Pain Pain Assessment Pain Assessment: No/denies pain  Therapy/Group: Individual Therapy  [name] [name] 09/21/2014, 4:26 PM

## 2014-09-21 NOTE — Progress Notes (Signed)
Occupational Therapy Session Note  Patient Details  Name: Diamond Rice MRN: [id number] Date of Birth: 1970-09-06  Today's Date: 09/21/2014 OT Individual Time:  -   0800 --0840  (39min)  Missed 15 minutes      Short Term Goals: Week 1:  OT Short Term Goal 1 (Week 1): Pt will complete bathing with supervision at sit > stand level OT Short Term Goal 1 - Progress (Week 1): Met OT Short Term Goal 2 (Week 1): Pt will complete toilet transfers with supervision with LRAD OT Short Term Goal 2 - Progress (Week 1): Met OT Short Term Goal 3 (Week 1): Pt will complete LB dressing wtih min assist OT Short Term Goal 3 - Progress (Week 1): Met OT Short Term Goal 4 (Week 1): Pt will utilize LUE as gross assist with self-feeding tasks with min verbal cues OT Short Term Goal 4 - Progress (Week 1): Met Week 2:  OT Short Term Goal 1 (Week 2): STG = LTGs due to remaining LOS  Skilled Therapeutic Interventions/Progress Updates:      1st session:  Pt. Lying in bed with right side pain.  Pt reports pain is 10/10 and is sharp and stabbing.  Applied heat pack for 10 minutes but pt reports no change.  Pt did not want to shower or dress.  Pt. Refused to do any LUE NMRE.  Informed RN.  Lidocain patch applied to right side.  Left pt in bed with bed alarm on and  call bell,phone within reach.      Therapy Documentation Precautions:  Precautions Precautions: Fall Precaution Comments: L hemiparesis Restrictions Weight Bearing Restrictions: No     Pain: Pain Assessment Pain Assessment: 0-10 Pain Score: 10/10  Pain Type: Acute pain Pain Location: right side Pain Orientation: Right;Lower Pain Descriptors / Indicators: Shooting;Sharp Pain Frequency: Constant Pain Onset: On-going Pain Intervention(s): Medication (See eMAR) Multiple Pain Sites: No    2nd session make up  Time:  1415-1445  (30 min) Pain:  7/10 left side Individual session:  Pt agreed to engage in LUE AROM, AAROM.  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  Addressed biceps, triceps, elbow extension, flexion, supination, pronation, wrist flexion, extension, and finger extension and prehension.  Provided exercises for finger extension for pt to do when not in therapy sessions.      :    See FIM for current functional status  Therapy/Group: Individual Therapy  Lisa Roca 09/21/2014, 8:25 AM

## 2014-09-21 NOTE — Progress Notes (Signed)
Patient complaining of severe, sharp pain 15/10 in right ribs unrelieved by the PRN tylenol or ultram.  Dr. Letta Pate notified; new order received for lidoderm patch.  Will continue to monitor.

## 2014-09-21 NOTE — Progress Notes (Signed)
Patient complaining of feeling anxious and requests anti-anxiety medication.  Marlowe [place], PA notified of request; new order received.  Will continue to monitor.

## 2014-09-21 NOTE — Progress Notes (Signed)
44 y.o. right handed female with history of hypertension, diabetes mellitus and peripheral neuropathy, polysubstance abuse and medical noncompliance due to financial hardship. Patient recently homeless and has been living with friends recently. Presented 09/08/2014 with left-sided weakness and slurred speech. Patient with recent admission 08/28/2014 for chest pain and elevated blood pressure with negative troponins urine drug screen was positive at that time for marijuana workup unremarkable. MRI of the brain showed small acute infarct posterior limb of the right internal capsule and corona radiata. MRA without major occlusion or stenosis. Recent echocardiogram with ejection fraction of 65% left ventricular diastolic dysfunction. Carotid Dopplers with no ICA stenosis. Patient did not receive TPA. Neurology services consulted maintained on aspirin for CVA prophylaxis   Subjective/Complaints: Left sided rib pain , no falls or trauma, no dysuria, some pain with deep breath  Review of Systems - Negative except occ back pain   Objective: Vital Signs: Blood pressure 115/75, pulse 74, temperature 98.4 F (36.9 C), temperature source Oral, resp. rate 20, weight 105.461 kg (232 lb 8 oz), last menstrual period 08/28/2014, SpO2 96 %. No results found. Results for orders placed or performed during the hospital encounter of 09/11/14 (from the past 72 hour(s))  Glucose, capillary     Status: None   Collection Time: 09/18/14 11:24 AM  Result Value Ref Range   Glucose-Capillary 92 70 - 99 mg/dL   Comment 1 Notify RN   Glucose, capillary     Status: Abnormal   Collection Time: 09/18/14  4:26 PM  Result Value Ref Range   Glucose-Capillary 185 (H) 70 - 99 mg/dL   Comment 1 Notify RN   Glucose, capillary     Status: Abnormal   Collection Time: 09/18/14  7:58 PM  Result Value Ref Range   Glucose-Capillary 243 (H) 70 - 99 mg/dL  Glucose, capillary     Status: Abnormal   Collection Time: 09/19/14  6:47 AM   Result Value Ref Range   Glucose-Capillary 109 (H) 70 - 99 mg/dL  Glucose, capillary     Status: Abnormal   Collection Time: 09/19/14 11:14 AM  Result Value Ref Range   Glucose-Capillary 143 (H) 70 - 99 mg/dL  Glucose, capillary     Status: None   Collection Time: 09/19/14  4:57 PM  Result Value Ref Range   Glucose-Capillary 75 70 - 99 mg/dL  Glucose, capillary     Status: Abnormal   Collection Time: 09/19/14  8:00 PM  Result Value Ref Range   Glucose-Capillary 176 (H) 70 - 99 mg/dL  Glucose, capillary     Status: Abnormal   Collection Time: 09/20/14  6:48 AM  Result Value Ref Range   Glucose-Capillary 101 (H) 70 - 99 mg/dL  Glucose, capillary     Status: Abnormal   Collection Time: 09/20/14 11:05 AM  Result Value Ref Range   Glucose-Capillary 105 (H) 70 - 99 mg/dL  Glucose, capillary     Status: None   Collection Time: 09/20/14  4:27 PM  Result Value Ref Range   Glucose-Capillary 95 70 - 99 mg/dL  Glucose, capillary     Status: Abnormal   Collection Time: 09/20/14  9:09 PM  Result Value Ref Range   Glucose-Capillary 193 (H) 70 - 99 mg/dL  Glucose, capillary     Status: Abnormal   Collection Time: 09/21/14  7:02 AM  Result Value Ref Range   Glucose-Capillary 147 (H) 70 - 99 mg/dL     HEENT: normal Cardio: RRR and no murmur Resp:  CTA B/L and unlabored GI: BS positive and NT, ND Extremity:  Pulses positive and No Edema Skin:   Intact Neuro: Alert/Oriented, Cranial Nerve II-XII normal, Normal Sensory and Abnormal Motor 3- Left delt bi tri grip, HF, KE 2- ankle DF Musc/Skel: Tenderness along Right lower ribs, lateral aspect, no CVA tenderness Gen NAD   Assessment/Plan: 1. Functional deficits secondary to Left hemi secondary to Right Int Capsule infarct which require 3+ hours per day of interdisciplinary therapy in a comprehensive inpatient rehab setting. Physiatrist is providing close team supervision and 24 hour management of active medical problems listed  below. Physiatrist and rehab team continue to assess barriers to discharge/monitor patient progress toward functional and medical goals.  Marland KitchenFIM: FIM - Bathing Bathing Steps Patient Completed: Right Arm, Chest, Front perineal area, Right lower leg (including foot), Left lower leg (including foot), Buttocks, Right upper leg, Left Arm, Abdomen, Left upper leg Bathing: 5: Supervision: Safety issues/verbal cues  FIM - Upper Body Dressing/Undressing Upper body dressing/undressing steps patient completed: Thread/unthread right sleeve of pullover shirt/dresss, Thread/unthread left sleeve of pullover shirt/dress, Put head through opening of pull over shirt/dress, Pull shirt over trunk Upper body dressing/undressing: 0: Wears gown/pajamas-no public clothing FIM - Lower Body Dressing/Undressing Lower body dressing/undressing steps patient completed: Thread/unthread right underwear leg, Pull underwear up/down, Don/Doff left sock, Don/Doff right sock Lower body dressing/undressing: 4: Min-Patient completed 75 plus % of tasks  FIM - Toileting Toileting steps completed by patient: Adjust clothing prior to toileting, Performs perineal hygiene, Adjust clothing after toileting Toileting Assistive Devices: Grab bar or rail for support Toileting: 5: Supervision: Safety issues/verbal cues  FIM - Radio producer Devices: Elevated toilet seat, Grab bars, Insurance account manager Transfers: 5-To toilet/BSC: Supervision (verbal cues/safety issues), 5-From toilet/BSC: Supervision (verbal cues/safety issues)  FIM - Control and instrumentation engineer Devices: Copy: 5: Supine > Sit: Supervision (verbal cues/safety issues), 5: Sit > Supine: Supervision (verbal cues/safety issues), 5: Bed > Chair or W/C: Supervision (verbal cues/safety issues), 5: Chair or W/C > Bed: Supervision (verbal cues/safety issues)  FIM - Locomotion: Wheelchair Distance: 200 Locomotion:  Wheelchair: 1: Total Assistance/staff pushes wheelchair (Pt<25%) FIM - Locomotion: Ambulation Locomotion: Ambulation Assistive Devices: Environmental consultant - Rolling, Orthosis, Other (comment) Ambulation/Gait Assistance: 4: Min guard, 4: Min assist Locomotion: Ambulation: 4: Travels 150 ft or more with minimal assistance (Pt.>75%)  Comprehension Comprehension Mode: Auditory Comprehension: 6-Follows complex conversation/direction: With extra time/assistive device  Expression Expression Mode: Verbal Expression: 5-Expresses basic needs/ideas: With extra time/assistive device  Social Interaction Social Interaction: 5-Interacts appropriately 90% of the time - Needs monitoring or encouragement for participation or interaction.  Problem Solving Problem Solving: 5-Solves complex 90% of the time/cues < 10% of the time  Memory Memory: 5-Recognizes or recalls 90% of the time/requires cueing < 10% of the time  Medical Problem List and Plan: 1. Functional deficits secondary to Right PLIC infarct/CR infarct 2.  DVT Prophylaxis/Anticoagulation: Subcutaneous heparin. Monitor platelet counts or any signs of bleeding 3. Pain Management: Tylenol as needed, add Lidoderm for lateral rib pain, check rib films 4. Dysphagia. Mechanical soft diet. Follow-up speech therapy 5. Neuropsych: This patient is capable of making decisions on her own behalf.  -Behavior health follow up 6. Skin/Wound Care: Routine skin checks 7. Fluids/Electrolytes/Nutrition: Strict I and O follow-up chemistries 8. Hypertension. Norvasc 10 mg daily, hydrochlorothiazide 25 mg daily, lisinopril 20 mg daily. Monitor with increased mobility 9. Diabetes mellitus with peripheral neuropathy. Hemoglobin A1c 10.3. Currently with sliding scale insulin. Patient on  CBGs improving on amaryl and carb mod diet although numbers can be variable.  -Appreciate diabetes coordinator note, doubt pt will comply with insulin administration.    -sugars improving  10.  Hyperlipidemia. Lipitor 11. Medical noncompliance. Documentation of medical noncompliance and financial constraints prior to admission. Provide counseling and social work follow-up 12. History of polysubstance abuse. Counseling 13.  Depression anxiety and poor sleep , trial remeron LOS (Days) 10 A FACE TO FACE EVALUATION WAS PERFORMED  Jaryn Rosko E 09/21/2014, 7:36 AM

## 2014-09-21 NOTE — Progress Notes (Signed)
Patient continues to have severe, sharp pain in right ribs.  Marlowe [place], PA notified and advises to give an extra dose of ultram until the results of the x-ray are received.  Will continue to monitor.

## 2014-09-21 NOTE — Progress Notes (Signed)
PRN ultram given at 2139, not effective. Rating pain a "10" on pain scale. Pain in RUQ, "sharp"! At 0130, patient moaning and holding RUQ. Continues to complain of sharp pain, area tender to touch, & increase pain when inhaling. Complains of feeling nauseated, no vomiting. Vitals placed in computer. Paged Dr. Naaman Plummer at [phone], with above info, 1 time dose of Oxy IR 5 mg given at [place]. Will continue to monitor.Diamond Rice A

## 2014-09-21 NOTE — Progress Notes (Signed)
Rested until 0620, PRN ultram given for RUQ pain. Rating 10 on pain scale. Diamond Rice

## 2014-09-21 NOTE — Progress Notes (Signed)
Resting quietly. Patrici Ranks A

## 2014-09-22 ENCOUNTER — Inpatient Hospital Stay (HOSPITAL_COMMUNITY): Payer: Self-pay | Admitting: *Deleted

## 2014-09-22 ENCOUNTER — Inpatient Hospital Stay (HOSPITAL_COMMUNITY): Payer: MEDICAID | Admitting: Occupational Therapy

## 2014-09-22 ENCOUNTER — Inpatient Hospital Stay (HOSPITAL_COMMUNITY): Payer: Self-pay | Admitting: Physical Therapy

## 2014-09-22 ENCOUNTER — Inpatient Hospital Stay (HOSPITAL_COMMUNITY): Payer: Self-pay | Admitting: Speech Pathology

## 2014-09-22 LAB — GLUCOSE, CAPILLARY
GLUCOSE-CAPILLARY: 191 mg/dL — AB (ref 70–99)
Glucose-Capillary: 113 mg/dL — ABNORMAL HIGH (ref 70–99)
Glucose-Capillary: 105 mg/dL — ABNORMAL HIGH (ref 70–99)
Glucose-Capillary: 144 mg/dL — ABNORMAL HIGH (ref 70–99)

## 2014-09-22 MED ORDER — PANTOPRAZOLE SODIUM 40 MG PO TBEC
40.0000 mg | DELAYED_RELEASE_TABLET | Freq: Every day | ORAL | Status: DC
  Administered 2014-09-22 – 2014-09-26 (×5): 40 mg via ORAL
  Filled 2014-09-22 (×6): qty 1

## 2014-09-22 NOTE — Progress Notes (Signed)
Occupational Therapy Session Note  Patient Details  Name: Christene Pounds MRN: [id number] Date of Birth: 04/02/1971  Today's Date: 09/22/2014 OT Individual Time: 1100-1200 OT Individual Time Calculation (min): 60 min    Short Term Goals: Week 2:  OT Short Term Goal 1 (Week 2): STG = LTGs due to remaining LOS  Skilled Therapeutic Interventions/Progress Updates:    Engaged in ADL retraining with focus on functional use of LUE and increased participation in LB dressing.  Pt ambulated to bathroom with RW with distant supervision and completed toileting at mod I level.  Pt continues to require supervision with ambulation secondary to fluctuations in balance.  Incorporated use of step stool and chair to assist with donning underwear and socks this session.  Pt required assist to pull up underwear due to it being hospital underwear and somewhat small.  Verbal cues to incorporate LUE into applying lotion, donning underwear and socks even as stabilizer if unable to grasp.    Therapy Documentation Precautions:  Precautions Precautions: Fall Precaution Comments: L hemiparesis Restrictions Weight Bearing Restrictions: No Pain:  Pt with no c/o pain  See FIM for current functional status  Therapy/Group: Individual Therapy  Simonne Come 09/22/2014, 12:13 PM

## 2014-09-22 NOTE — Progress Notes (Signed)
44 y.o. right handed female with history of hypertension, diabetes mellitus and peripheral neuropathy, polysubstance abuse and medical noncompliance due to financial hardship. Patient recently homeless and has been living with friends recently. Presented 09/08/2014 with left-sided weakness and slurred speech. Patient with recent admission 08/28/2014 for chest pain and elevated blood pressure with negative troponins urine drug screen was positive at that time for marijuana workup unremarkable. MRI of the brain showed small acute infarct posterior limb of the right internal capsule and corona radiata. MRA without major occlusion or stenosis. Recent echocardiogram with ejection fraction of 65% left ventricular diastolic dysfunction. Carotid Dopplers with no ICA stenosis. Patient did not receive TPA. Neurology services consulted maintained on aspirin for CVA prophylaxis   Subjective/Complaints: Left sided rib pain ,improved, discussed xray results with pt.  Reviewed with radiology as well.  No pathology seen  Review of Systems - Negative except occ back pain   Objective: Vital Signs: Blood pressure 122/84, pulse 71, temperature 98.6 F (37 C), temperature source Oral, resp. rate 20, weight 105.461 kg (232 lb 8 oz), last menstrual period 08/28/2014, SpO2 99 %. Dg Ribs Unilateral Right  09/21/2014   CLINICAL DATA:  Subsequent encounter for stroke 1 week ago with pain in the right lower ribs.  EXAM: RIGHT RIBS - 2 VIEW  COMPARISON:  Chest x-ray from 09/08/2014.  FINDINGS: Two view exam of the right ribs was obtained with a radiopaque BB localizing the area of patient concern. No underlying displaced right rib fracture is evident. No gross pneumothorax or pleural effusion.  IMPRESSION: No evidence for right-sided rib fracture.   Electronically Signed   By: Misty Stanley M.D.   On: 09/21/2014 10:48   Results for orders placed or performed during the hospital encounter of 09/11/14 (from the past 72 hour(s))   Glucose, capillary     Status: Abnormal   Collection Time: 09/19/14 11:14 AM  Result Value Ref Range   Glucose-Capillary 143 (H) 70 - 99 mg/dL  Glucose, capillary     Status: None   Collection Time: 09/19/14  4:57 PM  Result Value Ref Range   Glucose-Capillary 75 70 - 99 mg/dL  Glucose, capillary     Status: Abnormal   Collection Time: 09/19/14  8:00 PM  Result Value Ref Range   Glucose-Capillary 176 (H) 70 - 99 mg/dL  Glucose, capillary     Status: Abnormal   Collection Time: 09/20/14  6:48 AM  Result Value Ref Range   Glucose-Capillary 101 (H) 70 - 99 mg/dL  Glucose, capillary     Status: Abnormal   Collection Time: 09/20/14 11:05 AM  Result Value Ref Range   Glucose-Capillary 105 (H) 70 - 99 mg/dL  Glucose, capillary     Status: None   Collection Time: 09/20/14  4:27 PM  Result Value Ref Range   Glucose-Capillary 95 70 - 99 mg/dL  Glucose, capillary     Status: Abnormal   Collection Time: 09/20/14  9:09 PM  Result Value Ref Range   Glucose-Capillary 193 (H) 70 - 99 mg/dL  Glucose, capillary     Status: Abnormal   Collection Time: 09/21/14  7:02 AM  Result Value Ref Range   Glucose-Capillary 147 (H) 70 - 99 mg/dL  Glucose, capillary     Status: Abnormal   Collection Time: 09/21/14 11:20 AM  Result Value Ref Range   Glucose-Capillary 142 (H) 70 - 99 mg/dL  Glucose, capillary     Status: Abnormal   Collection Time: 09/21/14  4:35 PM  Result Value Ref Range   Glucose-Capillary 145 (H) 70 - 99 mg/dL  Glucose, capillary     Status: Abnormal   Collection Time: 09/21/14  8:35 PM  Result Value Ref Range   Glucose-Capillary 126 (H) 70 - 99 mg/dL   Comment 1 Notify RN   Glucose, capillary     Status: Abnormal   Collection Time: 09/22/14  6:49 AM  Result Value Ref Range   Glucose-Capillary 113 (H) 70 - 99 mg/dL   Comment 1 Notify RN      HEENT: normal Cardio: RRR and no murmur Resp: CTA B/L and unlabored GI: BS positive and NT, ND Extremity:  Pulses positive and No  Edema Skin:   Intact Neuro: Alert/Oriented, Cranial Nerve II-XII normal, Normal Sensory and Abnormal Motor 3- Left delt bi tri grip, HF, KE 2- ankle DF Musc/Skel: Tenderness along Right lower ribs, lateral aspect, no CVA tenderness Gen NAD   Assessment/Plan: 1. Functional deficits secondary to Left hemi secondary to Right Int Capsule infarct which require 3+ hours per day of interdisciplinary therapy in a comprehensive inpatient rehab setting. Physiatrist is providing close team supervision and 24 hour management of active medical problems listed below. Physiatrist and rehab team continue to assess barriers to discharge/monitor patient progress toward functional and medical goals.  Marland KitchenFIM: FIM - Bathing Bathing Steps Patient Completed: Right Arm, Chest, Front perineal area, Right lower leg (including foot), Left lower leg (including foot), Buttocks, Right upper leg, Left Arm, Abdomen, Left upper leg Bathing: 5: Supervision: Safety issues/verbal cues  FIM - Upper Body Dressing/Undressing Upper body dressing/undressing steps patient completed: Thread/unthread right sleeve of pullover shirt/dresss, Thread/unthread left sleeve of pullover shirt/dress, Put head through opening of pull over shirt/dress, Pull shirt over trunk Upper body dressing/undressing: 0: Wears gown/pajamas-no public clothing FIM - Lower Body Dressing/Undressing Lower body dressing/undressing steps patient completed: Thread/unthread right underwear leg, Pull underwear up/down, Don/Doff left sock, Don/Doff right sock Lower body dressing/undressing: 4: Min-Patient completed 75 plus % of tasks  FIM - Toileting Toileting steps completed by patient: Adjust clothing prior to toileting, Performs perineal hygiene, Adjust clothing after toileting Toileting Assistive Devices: Grab bar or rail for support Toileting: 5: Supervision: Safety issues/verbal cues  FIM - Radio producer Devices: Elevated toilet  seat, Grab bars, Insurance account manager Transfers: 5-To toilet/BSC: Supervision (verbal cues/safety issues), 5-From toilet/BSC: Supervision (verbal cues/safety issues)  FIM - Control and instrumentation engineer Devices: Copy: 0: Activity did not occur  FIM - Locomotion: Wheelchair Distance: 200 Locomotion: Wheelchair: 0: Activity did not occur FIM - Locomotion: Ambulation Locomotion: Ambulation Assistive Devices: Environmental consultant - Rolling, Orthosis, Other (comment) Ambulation/Gait Assistance: Not tested (comment) Locomotion: Ambulation: 0: Activity did not occur  Comprehension Comprehension Mode: Auditory Comprehension: 6-Follows complex conversation/direction: With extra time/assistive device  Expression Expression Mode: Verbal Expression: 5-Expresses basic needs/ideas: With extra time/assistive device  Social Interaction Social Interaction: 5-Interacts appropriately 90% of the time - Needs monitoring or encouragement for participation or interaction.  Problem Solving Problem Solving: 5-Solves basic problems: With no assist  Memory Memory: 5-Recognizes or recalls 90% of the time/requires cueing < 10% of the time  Medical Problem List and Plan: 1. Functional deficits secondary to Right PLIC infarct/CR infarct 2.  DVT Prophylaxis/Anticoagulation: Subcutaneous heparin. Monitor platelet counts or any signs of bleeding 3. Pain Management: Tylenol as needed, add Lidoderm for lateral rib pain, check rib films 4. Dysphagia. Mechanical soft diet. Follow-up speech therapy 5. Neuropsych: This patient is capable of making decisions on  her own behalf.  -Behavior health follow up 6. Skin/Wound Care: Routine skin checks 7. Fluids/Electrolytes/Nutrition: Strict I and O follow-up chemistries 8. Hypertension. Norvasc 10 mg daily, hydrochlorothiazide 25 mg daily, lisinopril 20 mg daily. Monitor with increased mobility 9. Diabetes mellitus with peripheral neuropathy. Hemoglobin  A1c 10.3. Currently with sliding scale insulin. Patient on CBGs improving on amaryl and carb mod diet although numbers can be variable.  -Appreciate diabetes coordinator note, doubt pt will comply with insulin administration.    -sugars improving  10. Hyperlipidemia. Lipitor 11. Medical noncompliance. Documentation of medical noncompliance and financial constraints prior to admission. Provide counseling and social work follow-up 12. History of polysubstance abuse. Counseling 13.  Depression anxiety and poor sleep , trial remeron LOS (Days) 11 A FACE TO FACE EVALUATION WAS PERFORMED  Rosilyn Coachman E 09/22/2014, 7:24 AM

## 2014-09-22 NOTE — Progress Notes (Signed)
Speech Language Pathology Daily Session Note  Patient Details  Name: Diamond Rice MRN: [id number] Date of Birth: 12-17-1970  Today's Date: 09/22/2014 SLP Individual Time: 0901-1001 SLP Individual Time Calculation (min): 60 min  Short Term Goals: Week 2: SLP Short Term Goal 1 (Week 2): Patient will utilize safe swallow strategies with regular textures and thin liquids with Mod I.   SLP Short Term Goal 2 (Week 2): Patient will return demonstration of oral motor exercises with use of visual aide with Mod I.  SLP Short Term Goal 3 (Week 2): Patient will produce intelligible speech at the conversational level with 90% accuracy with Min cues for use of compensatory strategies.  SLP Short Term Goal 4 (Week 2): Patient will return demonstration of 2 vocal hygiene techniques with Mod I   Skilled Therapeutic Interventions:  Pt was seen for skilled speech therapy targeting goals for speech intelligibility.  Upon arrival, pt was reclined in bed, asleep but easily awakened to voice and agreeable to participate in therapy with min cues.  Pt continues to present with harsh strained vocal quality with inconsistent improvements noted during structured tasks involving resonant voice and diaphragmatic breathing techniques.  Pt continues to endorse GERD-like symptoms affecting both her swallowing and voice quality; therefore SLP spoke with P,A. And RN about possible initiation of PPI if they felt it was appropriate.  SLP also facilitated the session with skilled re-education regarding vocal fold function in relation to speech and swallowing.  Pt returned demonstration of at least 1 vocal hygiene technique with supervision cues.  Continue per current plan of care.    FIM:  Comprehension Comprehension Mode: Auditory Comprehension: 6-Follows complex conversation/direction: With extra time/assistive device Expression Expression Mode: Verbal Expression: 5-Expresses basic needs/ideas: With extra time/assistive  device Social Interaction Social Interaction: 5-Interacts appropriately 90% of the time - Needs monitoring or encouragement for participation or interaction. Problem Solving Problem Solving: 5-Solves basic problems: With no assist Memory Memory: 5-Recognizes or recalls 90% of the time/requires cueing < 10% of the time  Pain Pain Assessment Pain Assessment: No/denies pain  Therapy/Group: Individual Therapy  Shaylene Paganelli, Selinda Orion 09/22/2014, 12:22 PM

## 2014-09-22 NOTE — Progress Notes (Signed)
Physical Therapy Weekly Progress Note  Patient Details  Name: Diamond Rice MRN: [id number] Date of Birth: [birth year]  Beginning of progress report period: September 12, 2014 End of progress report period: September 22, 2014  Today's Date: 09/22/2014 PT Individual Time: 1500-1600 PT Individual Time Calculation (min): 60 min   Patient has met 3 of 5 short term goals. Pt demonstrates improved stability/independence with bed mobility, functional transfers, and ambulation. Goal addressing car transfer not met, as this has not yet been addressed in PT. Overall, pt is currently at a supervision level with all aspects of functional mobility, with exception of Min A for stair negotiation.  Patient continues to demonstrate the following deficits: muscle weakness, decreased coordinationdecreased standing balance, decreased postural control, hemiplegia and decreased balance strategies and therefore will continue to benefit from skilled PT intervention to enhance overall performance with activity tolerance, balance, postural control, ability to compensate for deficits, functional use of  left lower extremity and coordination.  Patient progressing toward long term goals.  Continue plan of care.  PT Short Term Goals Week 1:  PT Short Term Goal 1 (Week 1): Pt will demonstrate transfers w/c to/from bed req SBA.  PT Short Term Goal 1 - Progress (Week 1): Met PT Short Term Goal 2 (Week 1): Pt will ambulate >= 150' with RW req SBA. PT Short Term Goal 2 - Progress (Week 1): Met PT Short Term Goal 3 (Week 1): Pt will ascend/descend >= 5 stairs with 1 or 2 rails req SBA. PT Short Term Goal 3 - Progress (Week 1): Progressing toward goal PT Short Term Goal 4 (Week 1): Pt will demonstrate mod I bed mobility.  PT Short Term Goal 4 - Progress (Week 1): Met PT Short Term Goal 5 (Week 1): Pt will demonstrate car transfer with CGA.  PT Short Term Goal 5 - Progress (Week 1): Not met (Goal not yet addressed) Week 2:  PT  Short Term Goal 1 (Week 2): STG's = LTG's secondary to anticipated LOS.  Skilled Therapeutic Interventions/Progress Updates:    Pt received semi reclined in bed; agreeable to therapy. Session focused on increasing gait stability, activity tolerance, and dynamic standing balance. Donned L WalkOn AFO (to control L genu recurvatum) and shoes then ambulated to/from bathroom with rolling walker, L hand orthosis with supervision and no overt LOB. Pt performed toilet transfer with rolling walker and supervision. Per pt request to do laundry, pt ambulated x100' to laundry room with rolling walker and L hand orthosis. Ambulation trial ended secondary to fatigue. Transported pt remaining distance to laundry room, where pt placed laundry and detergent in washer with RUE with supervision for stability with dynamic standing. Returned to room via gait x155' in controlled environment with rolling walker, L hand orthosis, and supervision, cueing to decrease LLE step length (to prevent genu recurvatum) and to increase RLE step length (to promote weight shift to L side). Upon returning to pt room, pt requesting to clean up room. Therefore, pt performed short-distance ambulation within room without assistive device with min guard for stability. In standing without UE support with close supervision to min guard, pt reached outside BOS with RUE to water vase of flowers. While cleaning night stand, pt utilized RUE for reaching laterally and across midline x4 consecutive minutes while sorting through papers; min guard required for stability/balance. Departed with pt seated EOB with RN present, bed alarm on, and all needs within reach.  Therapy Documentation Precautions:  Precautions Precautions: Fall Precaution Comments: L hemiparesis Restrictions  Weight Bearing Restrictions: No Vital Signs: Therapy Vitals Temp: 98.6 F (37 C) Temp Source: Oral Pulse Rate: 71 BP: (!) 136/92 mmHg Patient Position (if appropriate):  Lying Oxygen Therapy SpO2: 99 % O2 Device: Not Delivered Pain: Pain Assessment Pain Assessment: No/denies pain Locomotion : Ambulation Ambulation/Gait Assistance: 5: Supervision   See FIM for current functional status  Therapy/Group: Individual Therapy  Stefano Gaul 09/22/2014, 4:08 PM

## 2014-09-23 ENCOUNTER — Inpatient Hospital Stay (HOSPITAL_COMMUNITY): Payer: MEDICAID | Admitting: Occupational Therapy

## 2014-09-23 ENCOUNTER — Inpatient Hospital Stay (HOSPITAL_COMMUNITY): Payer: MEDICAID | Admitting: Physical Therapy

## 2014-09-23 ENCOUNTER — Inpatient Hospital Stay (HOSPITAL_COMMUNITY): Payer: Self-pay

## 2014-09-23 ENCOUNTER — Inpatient Hospital Stay (HOSPITAL_COMMUNITY): Payer: Self-pay | Admitting: Speech Pathology

## 2014-09-23 LAB — GLUCOSE, CAPILLARY
GLUCOSE-CAPILLARY: 154 mg/dL — AB (ref 70–99)
Glucose-Capillary: 104 mg/dL — ABNORMAL HIGH (ref 70–99)
Glucose-Capillary: 98 mg/dL (ref 70–99)
Glucose-Capillary: 206 mg/dL — ABNORMAL HIGH (ref 70–99)

## 2014-09-23 MED ORDER — ALUM & MAG HYDROXIDE-SIMETH 200-200-20 MG/5ML PO SUSP
30.0000 mL | ORAL | Status: DC | PRN
  Administered 2014-09-23: 30 mL via ORAL
  Filled 2014-09-23: qty 30

## 2014-09-23 NOTE — Progress Notes (Signed)
Speech Language Pathology Daily Session Note  Patient Details  Name: Diamond Rice MRN: [id number] Date of Birth: 10-03-1970  Today's Date: 09/23/2014 SLP Individual Time: 1115-1203 SLP Individual Time Calculation (min): 48 min  Short Term Goals: Week 2: SLP Short Term Goal 1 (Week 2): Patient will utilize safe swallow strategies with regular textures and thin liquids with Mod I.   SLP Short Term Goal 2 (Week 2): Patient will return demonstration of oral motor exercises with use of visual aide with Mod I.  SLP Short Term Goal 3 (Week 2): Patient will produce intelligible speech at the conversational level with 90% accuracy with Min cues for use of compensatory strategies.  SLP Short Term Goal 4 (Week 2): Patient will return demonstration of 2 vocal hygiene techniques with Mod I   Skilled Therapeutic Interventions:  Pt was seen for skilled ST targeting speech goals.  Upon arrival, pt was reclined in bed, awake, alert, and agreeable to participate in [place]. Pt transferred to wheelchair to improve breath support for speech during structured tasks.  SLP facilitated the session with a structured reading task targeting use of vocal hygiene techniques at the paragraph level.  Pt returned demonstration of at least 2 vocal hygiene techniques with supervision cues with slight improvements noted in vocal quality, most noticeably following cup sips of water.  Pt continues to clear her throat excessively when talking; therefore, SLP provided skilled RE-education regarding the need to limit throat clearing to prevent abuse to her vocal cords.  SLP encouraged pt to sip water or eat ice chips when pt felt the need to clear her throat.  Continue per current plan of care.    FIM:  Comprehension Comprehension Mode: Auditory Comprehension: 6-Follows complex conversation/direction: With extra time/assistive device Expression Expression Mode: Verbal Expression: 5-Expresses complex 90% of the time/cues < 10% of the  time Social Interaction Social Interaction: 5-Interacts appropriately 90% of the time - Needs monitoring or encouragement for participation or interaction. Problem Solving Problem Solving: 5-Solves basic problems: With no assist Memory Memory: 5-Recognizes or recalls 90% of the time/requires cueing < 10% of the time  Pain Pain Assessment Pain Assessment: No/denies pain  Therapy/Group: Individual Therapy  Vash Quezada, Selinda Orion 09/23/2014, 3:55 PM

## 2014-09-23 NOTE — Progress Notes (Signed)
Physical Therapy Session Note  Patient Details  Name: Diamond Rice MRN: [id number] Date of Birth: [birth year]  Today's Date: 09/23/2014 PT Individual Time: 0935-1030 PT Individual Time Calculation (min): 55 min   Short Term Goals: Week 2:  PT Short Term Goal 1 (Week 2): STG's = LTG's secondary to anticipated LOS.  Skilled Therapeutic Interventions/Progress Updates:    Pt received seated in w/c wearing L AFO; agreeable to therapy. Pt ambulated to/from bathroom with rolling walker, L hand orthosis, and supervision. Performed toilet transfer with supervision and rolling walker, grab bars. Transitioned to functional ambulation x75' then x50' in controlled and home environments with [facility] and min guard, verbal/tactile cueing for gait pattern (initially step-to pattern but progressing to step-though). Pt negotiated 6 stairs with bilat rails and supervision, verbal cueing for sequencing/technique with effective return demonstration; tactile cueing at L hand to stabilize on rail.   In rehab apartment, educated pt on fall recovery, situations in which to activate EMS after fall. Explained, demonstrated floor transfer (seated on couch<>long sitting on floor) for fall recovery and tall kneeling/quadruped position (to elicit LUE/LLE weightbearing, activation, and co-contraction). Pt then performed floor transfer with mod A overall, assist to stabilize/protect L knee during transitional movements, manual stabilization of L hand on floor during quadruped. Session ended in pt room, where pt was left seated in w/c with all needs within reach.  Therapy Documentation Precautions:  Precautions Precautions: Fall Precaution Comments: L hemiparesis Restrictions Weight Bearing Restrictions: No Pain: Pain Assessment Pain Assessment: 0-10 Pain Score: 5  Pain Type: Acute pain Pain Location: Shoulder Pain Orientation: Right Pain Descriptors / Indicators: Aching Pain Onset: Gradual Pain Intervention(s): Heat  applied Multiple Pain Sites: No Locomotion : Ambulation Ambulation/Gait Assistance: 4: Min guard   See FIM for current functional status  Therapy/Group: Individual Therapy  Annalese Stiner, Malva Cogan 09/23/2014, 10:43 AM

## 2014-09-23 NOTE — Progress Notes (Signed)
Occupational Therapy Session Note  Patient Details  Name: Diamond Rice MRN: [id number] Date of Birth: 15-Aug-1971  Today's Date: 09/23/2014 OT Individual Time: 1415-1450 OT Individual Time Calculation (min): 35 min make up time   Short Term Goals: Week 2:  OT Short Term Goal 1 (Week 2): STG = LTGs due to remaining LOS  Skilled Therapeutic Interventions/Progress Updates:    Pt seen for make up session with focus on LUE NMR and functional mobility.  Engaged in theraputty activities with focus on increased use of LUE with grasp/release and pinch.  Pt required increased time and reports fatigue in Lt shoulder at end of session secondary to compensation with shoulder elevation and abduction during activity.  Prior to returning to bed pt ambulated to room bathroom to void with use of quad cane and supervision.    Therapy Documentation Precautions:  Precautions Precautions: Fall Precaution Comments: L hemiparesis Restrictions Weight Bearing Restrictions: No Pain:  Pt with no c/o pain  See FIM for current functional status  Therapy/Group: Individual Therapy  Simonne Come 09/23/2014, 3:20 PM

## 2014-09-23 NOTE — Progress Notes (Signed)
Occupational Therapy Session Note  Patient Details  Name: Diamond Rice MRN: [id number] Date of Birth: [birth year]  Today's Date: 09/23/2014 OT Individual Time: 7282-0601 OT Individual Time Calculation (min): 50 min    Short Term Goals: Week 2:  OT Short Term Goal 1 (Week 2): STG = LTGs due to remaining LOS  Skilled Therapeutic Interventions/Progress Updates:   Engaged in ADL retraining with focus on increased independence with functional mobility, transfers, and self-care tasks.  Pt ambulated to room toilet and then shower with RW and distant supervision.  Pt able to complete toileting and bathing at mod I level this session.  Discussed upcoming d/c and where she will be staying.  Pt continues to voice concerns about staying with friend (from before) with no income.  Lb dressing completed with use of step stool and chair to allow pt to reach feet, discussed possible use of step stool upon d/c but not probable to be able to have chair to prop legs on.  Plan to further problem solve in following sessions.  Therapist donned pt's shoes this session secondary to time constraints.  Therapy Documentation Precautions:  Precautions Precautions: Fall Precaution Comments: L hemiparesis Restrictions Weight Bearing Restrictions: No Pain: Pain Assessment Pain Assessment: No/denies pain  See FIM for current functional status  Therapy/Group: Individual Therapy  Simonne Come 09/23/2014, 10:18 AM

## 2014-09-23 NOTE — Progress Notes (Addendum)
Physical Therapy Session Note  Patient Details  Name: Shamir Sedlar MRN: [id number] Date of Birth: Oct 11, 1970  Today's Date: 09/23/2014 PT Individual Time:  1510-1545 PT Individual Time Calculation (min): 35 min (unscheduled makeup session)   Short Term Goals: Week 2:  PT Short Term Goal 1 (Week 2): STG's = LTG's secondary to anticipated LOS.  Skilled Therapeutic Interventions/Progress Updates:    Unscheduled makeup session focusing on stair/curb negotiation, gait training, and car transfers. Pt received semi reclined in bed; agreeable to therapy with min coaxing. Seated EOM, donned bilat shoes, socks, and L AFO with total A for time management. Pt performed gait 2 x75' in controlled environment with [facility] and supervision for majority of trials, min A to recover from single posterior LOB while turning L. Questioning cues provided to remind pt to decrease LLE step length to prevent genu recurvatum when fatigued.  Pt expressing concern regarding uncertainty as to where she will be living at D/C. In attempt to prepare pt  to negotiate different types of potential home entrances, performed blocked practice of stair negotiation with 2 rails then 1 rail. With 2 rails, pt negotiated stairs forward-facing with step-to pattern and supervision. Pt then negotiated stairs laterally with bilat UE support at single rail with step-to pattern and supervision. Pt able to negotiate 6 consecutive stairs without rest break. Also negotiated curb step with [facility] and supervision, cueing for technique. Supervision required for simulated car transfer with [facility] .  Due to pt need to have BM, provided supervision with ambulation to bathroom and with transfer onto toilet using LBQC. Departed with pt seated on toilet with verbal understanding that once finished, pt was to pull call light prior to standing. NT notified of pt location.  Therapy Documentation Precautions:  Precautions Precautions: Fall Precaution Comments: L  hemiparesis Restrictions Weight Bearing Restrictions: No Pain: Pain Assessment Pain Assessment: No/denies pain   Downgraded the following long term goals due to limited activity tolerance: distance for ambulation in all environments; number of stairs.  See FIM for current functional status  Therapy/Group: Individual Therapy  Hobble, Malva Cogan 09/23/2014, 3:28 PM

## 2014-09-23 NOTE — Progress Notes (Signed)
MRI of the brain showed small acute infarct posterior limb of the right internal capsule and corona radiata. MRA without major occlusion or stenosis. Recent echocardiogram with ejection fraction of 65% left ventricular diastolic dysfunction. Carotid Dopplers with no ICA stenosis. Patient did not receive TPA. Neurology services consulted maintained on aspirin for CVA prophylaxis   Subjective/Complaints: No problems overnite, discussed CBGs, only elevation at 9pm, pt denies eating dessert at dinner  Review of Systems - Negative except occ back pain   Objective: Vital Signs: Blood pressure 124/83, pulse 71, temperature 98.2 F (36.8 C), temperature source Oral, resp. rate 18, weight 105.461 kg (232 lb 8 oz), last menstrual period 08/28/2014, SpO2 98 %. Dg Ribs Unilateral Right  09/21/2014   CLINICAL DATA:  Subsequent encounter for stroke 1 week ago with pain in the right lower ribs.  EXAM: RIGHT RIBS - 2 VIEW  COMPARISON:  Chest x-ray from 09/08/2014.  FINDINGS: Two view exam of the right ribs was obtained with a radiopaque BB localizing the area of patient concern. No underlying displaced right rib fracture is evident. No gross pneumothorax or pleural effusion.  IMPRESSION: No evidence for right-sided rib fracture.   Electronically Signed   By: Misty Stanley M.D.   On: 09/21/2014 10:48   Results for orders placed or performed during the hospital encounter of 09/11/14 (from the past 72 hour(s))  Glucose, capillary     Status: Abnormal   Collection Time: 09/20/14 11:05 AM  Result Value Ref Range   Glucose-Capillary 105 (H) 70 - 99 mg/dL  Glucose, capillary     Status: None   Collection Time: 09/20/14  4:27 PM  Result Value Ref Range   Glucose-Capillary 95 70 - 99 mg/dL  Glucose, capillary     Status: Abnormal   Collection Time: 09/20/14  9:09 PM  Result Value Ref Range   Glucose-Capillary 193 (H) 70 - 99 mg/dL  Glucose, capillary     Status: Abnormal   Collection Time: 09/21/14  7:02 AM   Result Value Ref Range   Glucose-Capillary 147 (H) 70 - 99 mg/dL  Glucose, capillary     Status: Abnormal   Collection Time: 09/21/14 11:20 AM  Result Value Ref Range   Glucose-Capillary 142 (H) 70 - 99 mg/dL  Glucose, capillary     Status: Abnormal   Collection Time: 09/21/14  4:35 PM  Result Value Ref Range   Glucose-Capillary 145 (H) 70 - 99 mg/dL  Glucose, capillary     Status: Abnormal   Collection Time: 09/21/14  8:35 PM  Result Value Ref Range   Glucose-Capillary 126 (H) 70 - 99 mg/dL   Comment 1 Notify RN   Glucose, capillary     Status: Abnormal   Collection Time: 09/22/14  6:49 AM  Result Value Ref Range   Glucose-Capillary 113 (H) 70 - 99 mg/dL   Comment 1 Notify RN   Glucose, capillary     Status: Abnormal   Collection Time: 09/22/14 12:07 PM  Result Value Ref Range   Glucose-Capillary 105 (H) 70 - 99 mg/dL  Glucose, capillary     Status: Abnormal   Collection Time: 09/22/14  4:47 PM  Result Value Ref Range   Glucose-Capillary 144 (H) 70 - 99 mg/dL  Glucose, capillary     Status: Abnormal   Collection Time: 09/22/14  8:53 PM  Result Value Ref Range   Glucose-Capillary 191 (H) 70 - 99 mg/dL  Glucose, capillary     Status: Abnormal   Collection Time:  09/23/14  6:38 AM  Result Value Ref Range   Glucose-Capillary 104 (H) 70 - 99 mg/dL     HEENT: normal Cardio: RRR and no murmur Resp: CTA B/L and unlabored GI: BS positive and NT, ND Extremity:  Pulses positive and No Edema Skin:   Intact Neuro: Alert/Oriented, Cranial Nerve II-XII normal, Normal Sensory and Abnormal Motor 3- Left delt bi tri grip, HF, KE 3- ankle DF Musc/Skel: Tenderness along Right lower ribs, lateral aspect, no CVA tenderness Gen NAD   Assessment/Plan: 1. Functional deficits secondary to Left hemi secondary to Right Int Capsule infarct which require 3+ hours per day of interdisciplinary therapy in a comprehensive inpatient rehab setting. Physiatrist is providing close team supervision and  24 hour management of active medical problems listed below. Physiatrist and rehab team continue to assess barriers to discharge/monitor patient progress toward functional and medical goals.  Marland KitchenFIM: FIM - Bathing Bathing Steps Patient Completed: Right Arm, Chest, Front perineal area, Right lower leg (including foot), Left lower leg (including foot), Buttocks, Right upper leg, Left Arm, Abdomen, Left upper leg Bathing: 5: Supervision: Safety issues/verbal cues  FIM - Upper Body Dressing/Undressing Upper body dressing/undressing steps patient completed: Thread/unthread right sleeve of pullover shirt/dresss, Thread/unthread left sleeve of pullover shirt/dress, Put head through opening of pull over shirt/dress, Pull shirt over trunk Upper body dressing/undressing: 0: Wears gown/pajamas-no public clothing FIM - Lower Body Dressing/Undressing Lower body dressing/undressing steps patient completed: Thread/unthread right underwear leg, Thread/unthread left underwear leg, Don/Doff right sock, Don/Doff left sock Lower body dressing/undressing: 4: Min-Patient completed 75 plus % of tasks  FIM - Toileting Toileting steps completed by patient: Adjust clothing prior to toileting, Performs perineal hygiene Toileting Assistive Devices: Grab bar or rail for support Toileting: 3: Mod-Patient completed 2 of 3 steps  FIM - Radio producer Devices: Insurance account manager Transfers: 5-To toilet/BSC: Supervision (verbal cues/safety issues), 5-From toilet/BSC: Supervision (verbal cues/safety issues)  FIM - Control and instrumentation engineer Devices: Copy: 5: Bed > Chair or W/C: Supervision (verbal cues/safety issues), 5: Chair or W/C > Bed: Supervision (verbal cues/safety issues), 6: Supine > Sit: No assist, 6: Sit > Supine: No assist  FIM - Locomotion: Wheelchair Distance: 200 Locomotion: Wheelchair: 1: Total Assistance/staff pushes wheelchair (Pt<25%) FIM -  Locomotion: Ambulation Locomotion: Ambulation Assistive Devices: Walker - Rolling, Orthosis, Other (comment) (L AFO) Ambulation/Gait Assistance: 5: Supervision Locomotion: Ambulation: 2: Travels 50 - 149 ft with supervision/safety issues  Comprehension Comprehension Mode: Auditory Comprehension: 6-Follows complex conversation/direction: With extra time/assistive device  Expression Expression Mode: Verbal Expression: 5-Expresses basic needs/ideas: With no assist  Social Interaction Social Interaction Mode: Asleep Social Interaction: 5-Interacts appropriately 90% of the time - Needs monitoring or encouragement for participation or interaction.  Problem Solving Problem Solving: 5-Solves basic problems: With no assist  Memory Memory Mode: Asleep Memory: 5-Recognizes or recalls 90% of the time/requires cueing < 10% of the time  Medical Problem List and Plan: 1. Functional deficits secondary to Right PLIC infarct/CR infarct 2.  DVT Prophylaxis/Anticoagulation: Subcutaneous heparin. Monitor platelet counts or any signs of bleeding 3. Pain Management: Tylenol as needed, add Lidoderm for lateral rib pain, check rib films 4. Dysphagia. Mechanical soft diet. Follow-up speech therapy 5. Neuropsych: This patient is capable of making decisions on her own behalf.  -Behavior health follow up 6. Skin/Wound Care: Routine skin checks 7. Fluids/Electrolytes/Nutrition: Strict I and O follow-up chemistries 8. Hypertension. Norvasc 10 mg daily, hydrochlorothiazide 25 mg daily, lisinopril 20 mg daily. Monitor with  increased mobility 9. Diabetes mellitus with peripheral neuropathy. Hemoglobin A1c 10.3. Currently with sliding scale insulin. Patient on CBGs improving on amaryl and carb mod diet although numbers can be variable.  -Appreciate diabetes coordinator note, doubt pt will comply with insulin administration.    -sugars improving  10. Hyperlipidemia. Lipitor 11. Medical noncompliance. Documentation  of medical noncompliance and financial constraints prior to admission. Provide counseling and social work follow-up 12. History of polysubstance abuse. Counseling 13.  Depression anxiety and poor sleep , trial remeron LOS (Days) 12 A FACE TO FACE EVALUATION WAS PERFORMED  KIRSTEINS,ANDREW E 09/23/2014, 7:19 AM

## 2014-09-23 NOTE — Progress Notes (Signed)
Occupational Therapy Note  Patient Details  Name: Diamond Rice MRN: [id number] Date of Birth: 12/17/1970  Today's Date: 09/23/2014 OT Individual Time: 1300-1330 OT Individual Time Calculation (min): 30 min   Pt c/o Rt shoulder pain (unrated); repositioned and Kinesio tape applied Individual Therapy  Pt seen for 1:1 therapy with focus on Kinesio tape application to Rt shoulder for pain reduction.  Pt also engaged in sit<>stand, dynamic standing balance, transfers, and bed mobility.  Pt close supervision for all tasks.   Leotis Shames [facility] 09/23/2014, 3:00 PM

## 2014-09-24 ENCOUNTER — Inpatient Hospital Stay (HOSPITAL_COMMUNITY): Payer: MEDICAID | Admitting: Occupational Therapy

## 2014-09-24 ENCOUNTER — Inpatient Hospital Stay (HOSPITAL_COMMUNITY): Payer: MEDICAID

## 2014-09-24 ENCOUNTER — Inpatient Hospital Stay (HOSPITAL_COMMUNITY): Payer: Self-pay | Admitting: Rehabilitation

## 2014-09-24 LAB — GLUCOSE, CAPILLARY
GLUCOSE-CAPILLARY: 111 mg/dL — AB (ref 70–99)
Glucose-Capillary: 67 mg/dL — ABNORMAL LOW (ref 70–99)
Glucose-Capillary: 226 mg/dL — ABNORMAL HIGH (ref 70–99)
Glucose-Capillary: 264 mg/dL — ABNORMAL HIGH (ref 70–99)

## 2014-09-24 NOTE — Progress Notes (Signed)
Physical Therapy Session Note  Patient Details  Name: Diamond Rice MRN: [id number] Date of Birth: 02/01/1971  Today's Date: 09/24/2014 PT Individual Time: 0830-0930 PT Individual Time Calculation (min): 60 min   Short Term Goals: Week 2:  PT Short Term Goal 1 (Week 2): STG's = LTG's secondary to anticipated LOS.  Skilled Therapeutic Interventions/Progress Updates:   Pt received lying in bed, agreeable to therapy session.  Performed bed mobility with HOB flat and without rails to simulate home at S to mod I level.  Once at EOB, had pt don socks and shoes with L AFO on her own today without use of chair to elevate foot on.  She requires increased time to complete task with therapist only assisting in elevating L tongue for getting foot into shoe.  Pt then ambulated to/from restroom with [facility] at S level.  Pt able to manage gown and toileting tasks at S level.  Ambulated to sink and washed/dried hands at distant S to mod I level.  Min cues for safety, as she wanted to let go of cane while placing linen in linen bag.  Pt then ambulated to therapy gym with [facility] and L AFO at S level.  Min cues for upright posture and decreased support on cane to avoid shoulder pain.  While ambulating discussed D/C plan and home entry, bed that she would be sleeping on, etc.  Pt still unsure of where she will go, but states that she will have a bed and it will likely be low, but is still unsure of stairs.  Once in therapy gym, allowed rest break before performing 3, 6" steps with [facility] only at min A level.  Mod cues for sequencing and technique with demonstration cues also.  Then worked on high level balance, gait activities without AD in forwards gait, backwards gait and side stepping to work on weight shifts, WB through LLE and hamstring/hip abd strength.  Requires min A throughout with cues for upright posture.  Ambulated to ADL apt in order to continue to work on functional transfers and NMR through LLE with sit<>stands  from low couch surface without UE use x 5 reps.  Pt able to perform at S to mod I level.  Ambulated back to room as stated above at S level.  Min cues for increased step length on RLE and upright posture throughout.   Left in recliner with all needs in reach.   Therapy Documentation Precautions:  Precautions Precautions: Fall Precaution Comments: L hemiparesis Restrictions Weight Bearing Restrictions: No   Vital Signs: Therapy Vitals Temp: 98.1 F (36.7 C) Temp Source: Oral Pulse Rate: 75 Resp: 17 BP: 110/71 mmHg Patient Position (if appropriate): Lying Oxygen Therapy SpO2: 97 % O2 Device: Not Delivered Pain: Pain Assessment Pain Assessment: 0-10 Pain Type: Acute pain Pain Location: Shoulder Pain Orientation: Right Pain Descriptors / Indicators: Aching Pain Onset: Gradual Pain Intervention(s): Medication (See eMAR)  See FIM for current functional status  Therapy/Group: Individual Therapy  Denice Bors 09/24/2014, 8:56 AM

## 2014-09-24 NOTE — Progress Notes (Addendum)
Physical Therapy Session Note  Patient Details  Name: Diamond Rice MRN: [id number] Date of Birth: 1971-05-30  Today's Date: 09/24/2014 PT Individual Time: 1400-1500 PT Individual Time Calculation (min): 60 min (45 min make up time. 15 min regular PT session).  Short Term Goals: Week 1:  PT Short Term Goal 1 (Week 1): Pt will demonstrate transfers w/c to/from bed req SBA.  PT Short Term Goal 1 - Progress (Week 1): Met PT Short Term Goal 2 (Week 1): Pt will ambulate >= 150' with RW req SBA. PT Short Term Goal 2 - Progress (Week 1): Met PT Short Term Goal 3 (Week 1): Pt will ascend/descend >= 5 stairs with 1 or 2 rails req SBA. PT Short Term Goal 3 - Progress (Week 1): Progressing toward goal PT Short Term Goal 4 (Week 1): Pt will demonstrate mod I bed mobility.  PT Short Term Goal 4 - Progress (Week 1): Met PT Short Term Goal 5 (Week 1): Pt will demonstrate car transfer with CGA.  PT Short Term Goal 5 - Progress (Week 1): Not met (Goal not addressed) Week 2:  PT Short Term Goal 1 (Week 2): STG's = LTG's secondary to anticipated LOS.  Skilled Therapeutic Interventions/Progress Updates:   Session focused on community mobility using [facility], simulated car transfer, issued and reviewed [place] HEP for LE strengthening and balance, and basic transfer back to bed at end of session. Overall pt at S level for mobility though 2 episodes of slight LOB with steady A to recover during functional mobility noted. Navigated on and off unit using elevator, navigating through gift shop, pick up items from higher and lower level surfaces, and paying for items at counter to address community mobility. Pt performed car transfer to sedan height with overall S to mod I level using WBQC. Due to time constraints, only performed 5 reps each BLE of Carlos program to introduce to patient (LAQ, standing hip abduction, hamstring curl, mini squats, and heel/toe raises. Returned to bed end of session to rest.  Therapy  Documentation Precautions:  Precautions Precautions: Fall Precaution Comments: L hemiparesis Restrictions Weight Bearing Restrictions: No Pain:  Denies pain.   See FIM for current functional status  Therapy/Group: Individual Therapy  Diamond Rice [facility] 09/24/2014, 3:06 PM

## 2014-09-24 NOTE — Progress Notes (Signed)
Subjective/Complaints: No issues overnite, had Right shoulder pain during the day  Review of Systems - Negative except occ back pain   Objective: Vital Signs: Blood pressure 110/71, pulse 75, temperature 98.1 F (36.7 C), temperature source Oral, resp. rate 17, weight 106.7 kg (235 lb 3.7 oz), last menstrual period 08/28/2014, SpO2 97 %. No results found. Results for orders placed or performed during the hospital encounter of 09/11/14 (from the past 72 hour(s))  Glucose, capillary     Status: Abnormal   Collection Time: 09/21/14 11:20 AM  Result Value Ref Range   Glucose-Capillary 142 (H) 70 - 99 mg/dL  Glucose, capillary     Status: Abnormal   Collection Time: 09/21/14  4:35 PM  Result Value Ref Range   Glucose-Capillary 145 (H) 70 - 99 mg/dL  Glucose, capillary     Status: Abnormal   Collection Time: 09/21/14  8:35 PM  Result Value Ref Range   Glucose-Capillary 126 (H) 70 - 99 mg/dL   Comment 1 Notify RN   Glucose, capillary     Status: Abnormal   Collection Time: 09/22/14  6:49 AM  Result Value Ref Range   Glucose-Capillary 113 (H) 70 - 99 mg/dL   Comment 1 Notify RN   Glucose, capillary     Status: Abnormal   Collection Time: 09/22/14 12:07 PM  Result Value Ref Range   Glucose-Capillary 105 (H) 70 - 99 mg/dL  Glucose, capillary     Status: Abnormal   Collection Time: 09/22/14  4:47 PM  Result Value Ref Range   Glucose-Capillary 144 (H) 70 - 99 mg/dL  Glucose, capillary     Status: Abnormal   Collection Time: 09/22/14  8:53 PM  Result Value Ref Range   Glucose-Capillary 191 (H) 70 - 99 mg/dL  Glucose, capillary     Status: Abnormal   Collection Time: 09/23/14  6:38 AM  Result Value Ref Range   Glucose-Capillary 104 (H) 70 - 99 mg/dL  Glucose, capillary     Status: None   Collection Time: 09/23/14 11:29 AM  Result Value Ref Range   Glucose-Capillary 98 70 - 99 mg/dL   Comment 1 Notify RN   Glucose, capillary     Status: Abnormal   Collection Time: 09/23/14   5:02 PM  Result Value Ref Range   Glucose-Capillary 206 (H) 70 - 99 mg/dL   Comment 1 Notify RN   Glucose, capillary     Status: Abnormal   Collection Time: 09/23/14  9:03 PM  Result Value Ref Range   Glucose-Capillary 154 (H) 70 - 99 mg/dL  Glucose, capillary     Status: Abnormal   Collection Time: 09/24/14  6:48 AM  Result Value Ref Range   Glucose-Capillary 111 (H) 70 - 99 mg/dL     HEENT: normal Cardio: RRR and no murmur Resp: CTA B/L and unlabored GI: BS positive and NT, ND Extremity:  Pulses positive and No Edema Skin:   Intact  Musc/Skel: Tenderness along Right lower ribs, lateral aspect, no CVA tenderness Gen NAD   Assessment/Plan: 1. Functional deficits secondary to Left hemi secondary to Right Int Capsule infarct which require 3+ hours per day of interdisciplinary therapy in a comprehensive inpatient rehab setting. Physiatrist is providing close team supervision and 24 hour management of active medical problems listed below. Physiatrist and rehab team continue to assess barriers to discharge/monitor patient progress toward functional and medical goals.  Marland KitchenFIM: FIM - Bathing Bathing Steps Patient Completed: Right Arm, Chest, Front perineal area,  Right lower leg (including foot), Left lower leg (including foot), Buttocks, Right upper leg, Left Arm, Abdomen, Left upper leg Bathing: 6: Assistive device (Comment)  FIM - Upper Body Dressing/Undressing Upper body dressing/undressing steps patient completed: Thread/unthread right sleeve of pullover shirt/dresss, Thread/unthread left sleeve of pullover shirt/dress, Put head through opening of pull over shirt/dress, Pull shirt over trunk Upper body dressing/undressing: 5: Set-up assist to: Obtain clothing/put away FIM - Lower Body Dressing/Undressing Lower body dressing/undressing steps patient completed: Thread/unthread right underwear leg, Thread/unthread left underwear leg, Pull underwear up/down, Thread/unthread right pants  leg, Thread/unthread left pants leg, Pull pants up/down, Don/Doff right sock, Don/Doff left sock Lower body dressing/undressing: 4: Min-Patient completed 75 plus % of tasks  FIM - Toileting Toileting steps completed by patient: Adjust clothing prior to toileting, Performs perineal hygiene, Adjust clothing after toileting Toileting Assistive Devices: Grab bar or rail for support Toileting: 6: Assistive device: No helper  FIM - Radio producer Devices: Oncologist Transfers: 5-To toilet/BSC: Supervision (verbal cues/safety issues)  FIM - Control and instrumentation engineer Devices: Orthosis, Best boy: 6: Sit > Supine: No assist, 6: Supine > Sit: No assist, 5: Chair or W/C > Bed: Supervision (verbal cues/safety issues), 5: Bed > Chair or W/C: Supervision (verbal cues/safety issues)  FIM - Locomotion: Wheelchair Distance: 200 Locomotion: Wheelchair: 0: Activity did not occur (Pt ambulatory on unit) FIM - Locomotion: Ambulation Locomotion: Ambulation Assistive Devices: Cane - Quad, Orthosis (LBQC, L AFO) Ambulation/Gait Assistance: 5: Supervision, 4: Min assist, 4: Min guard Locomotion: Ambulation: 2: Travels 50 - 149 ft with minimal assistance (Pt.>75%)  Comprehension Comprehension Mode: Auditory Comprehension: 6-Follows complex conversation/direction: With extra time/assistive device  Expression Expression Mode: Verbal Expression: 5-Expresses complex 90% of the time/cues < 10% of the time  Social Interaction Social Interaction Mode: Asleep Social Interaction: 5-Interacts appropriately 90% of the time - Needs monitoring or encouragement for participation or interaction.  Problem Solving Problem Solving: 5-Solves basic problems: With no assist  Memory Memory Mode: Asleep Memory: 5-Recognizes or recalls 90% of the time/requires cueing < 10% of the time  Medical Problem List and Plan: 1. Functional deficits secondary to Right  PLIC infarct/CR infarct 2.  DVT Prophylaxis/Anticoagulation: Subcutaneous heparin. Monitor platelet counts or any signs of bleeding 3. Pain Management: Tylenol as needed, add Lidoderm for lateral rib pain, check rib films 4. Dysphagia. Mechanical soft diet. Follow-up speech therapy 5. Neuropsych: This patient is capable of making decisions on her own behalf.  -Behavior health follow up 6. Skin/Wound Care: Routine skin checks 7. Fluids/Electrolytes/Nutrition: Strict I and O follow-up chemistries 8. Hypertension. Norvasc 10 mg daily, hydrochlorothiazide 25 mg daily, lisinopril 20 mg daily. Monitor with increased mobility 9. Diabetes mellitus with peripheral neuropathy. Hemoglobin A1c 10.3. Currently with sliding scale insulin. Patient on CBGs improving on amaryl and carb mod diet although numbers can be variable.  -Appreciate diabetes coordinator note, doubt pt will comply with insulin administration.    -sugars improving  10. Hyperlipidemia. Lipitor 11. Medical noncompliance. Documentation of medical noncompliance and financial constraints prior to admission. Provide counseling and social work follow-up 12. History of polysubstance abuse. Counseling 13.  Depression anxiety and poor sleep , trial remeron LOS (Days) 13 A FACE TO FACE EVALUATION WAS PERFORMED  Diamond Rice 09/24/2014, 7:21 AM

## 2014-09-24 NOTE — Patient Care Conference (Signed)
Inpatient RehabilitationTeam Conference and Plan of Care Update Date: 09/23/2014   Time: 10:30 AM    [name]      Medical Record Number: [id number]  Date of Birth: 04/18/1971 Sex: Female         Room/Bed: 4M07C/4M07C-01 Payor Info: Payor: MEDICAID PENDING / Plan: MEDICAID PENDING / Product Type: *No Product type* /    Admitting Diagnosis: RT CVA  Admit Date/Time:  09/11/2014  2:51 PM Admission Comments: No comment available   Primary Diagnosis:  Right-sided lacunar infarction Principal Problem: Right-sided lacunar infarction  Patient Active Problem List   Diagnosis Date Noted  . Right-sided lacunar infarction 09/15/2014  . Left hemiparesis 09/15/2014  . Nausea without vomiting   . CVA (cerebral infarction) 09/11/2014  . Dyspnea   . CVA (cerebral vascular accident) 09/08/2014  . Pain in the chest   . Essential hypertension   . Type 2 diabetes mellitus with complication   . Back pain 09/11/2013  . Psychoactive substance-induced organic mood disorder 05/28/2013  . Cocaine abuse with cocaine-induced mood disorder 05/28/2013  . Cannabis dependence with cannabis-induced anxiety disorder 05/28/2013  . Morbid obesity with BMI of 45.0-49.9, adult 04/25/2012  . Chest pain 04/17/2012  . Hypertension 04/17/2012  . DM (diabetes mellitus), type 2, uncontrolled 04/17/2012  . Hyperlipidemia 04/17/2012  . Tobacco use 04/17/2012    Expected Discharge Date: Expected Discharge Date: 09/26/14  Team Members Present: Physician leading conference: Dr. Alysia Penna Social Worker Present: Alfonse Alpers, LCSW Nurse Present: Heather Roberts, RN PT Present: Georjean Mode, PT;Blair Hobble, PT OT Present: Meriel Pica, Jules Schick, OT SLP Present: Windell Moulding, SLP PPS Coordinator present : Daiva Nakayama, RN, CRRN     Current Status/Progress Goal Weekly Team Focus  Medical   Right rib pain, limited social resources  Home discharge  Pain management   Bowel/Bladder   continent of bowel and bladder   last bm1-25  mod i assist  assess and treat for constipation as needed   Swallow/Nutrition/ Hydration   Reg, thin liquids, intermittent supervision   independent with least restrictive diet   use of swallowing precautions    ADL's   min assist - close supervision  mod I  LUE NMR, dynanmic standing balance, functional mobility, transfers   Mobility   Supervision overall  Mod I overall  L NMR, dynamic standing balance, activity tolerance, transfer/gait training, begin D/C planning   Communication   minimal dysarthria, harsh vocal quality, PA initiated PPI yesterday for reflux  Mod I for use of dysarthria strategies in conversation   continue use of vocal hygiene, oral motor exercises.     Safety/Cognition/ Behavioral Observations  intact   supervision  assess for safety prn   Pain   c/o back and rib pain  <3  assist and treat pain  as needed   Skin   no skin break down  mod i  assess skin q shift and prn    Rehab Goals Patient on target to meet rehab goals: Yes Rehab Goals Revised: none *See Care Plan and progress notes for long and short-term goals.  Barriers to Discharge: Per his living situation    Possible Resolutions to Barriers:  Set up with maximum community resources    Discharge Planning/Teaching Needs:  Pt continues to plan to go to her friend's home at d/c.  She is unsure how long she will be able to stay there since she will not have any income to contribute to the household.  Pt's  goals are for modified independent.   Team Discussion:  Pt's diabetes is under better control and she may not need insulin.  Pt has some pain with bruised ribs.  Pt will need tub bench and an assistive walking device.  Pt will need an AFO.  ST questions if pt is having reflux issues and MD plans to start her on medication to see if that helps.  Therapists prefer outpatient therapy if pt can   Revisions to Treatment Plan:  None   Continued Need for Acute  Rehabilitation Level of Care: The patient requires daily medical management by a physician with specialized training in physical medicine and rehabilitation for the following conditions: Daily direction of a multidisciplinary physical rehabilitation program to ensure safe treatment while eliciting the highest outcome that is of practical value to the patient.: Yes Daily medical management of patient stability for increased activity during participation in an intensive rehabilitation regime.: Yes Daily analysis of laboratory values and/or radiology reports with any subsequent need for medication adjustment of medical intervention for : Neurological problems;Other  [name], [name] 09/24/2014, 11:24 AM

## 2014-09-24 NOTE — Progress Notes (Signed)
Social Work Patient ID: Diamond Rice, female   DOB: 1971/04/26, 44 y.o.   MRN: [id number]   CSW met with pt to update her on team conference discussion.  Pt is pleased that d/c will be on Saturday and still plans to go back to her friend's home.  CSW will work with her on other community resources.  CSW will also order DME and arrange f/u care for pt.

## 2014-09-24 NOTE — Progress Notes (Signed)
Occupational Therapy Session Note  Patient Details  Name: Diamond Rice MRN: [id number] Date of Birth: 03/18/1971  Today's Date: 09/24/2014 OT Individual Time: 1030-1115 and 1300-1400 OT Individual Time Calculation (min): 45 min and 60 min   Short Term Goals: Week 2:  OT Short Term Goal 1 (Week 2): STG = LTGs due to remaining LOS  Skilled Therapeutic Interventions/Progress Updates:    1) Engaged in ADL retraining with focus on functional mobility, transfers, and functional use of LUE during self-care tasks.  Mod I with bathing and dressing this session at sit > stand level, supervision with ambulation and transfers to/from toilet and room shower with quad cane.  Pt donned pants and socks this session from EOB with use of bed to prop alternating leg to increase success with donning socks without use of 2nd chair.  Will address shoes during next session due to limited time.  Pt reports pleased with progress towards goals.    2) Treatment session with focus on LB dressing, transfers, and home making tasks.  Pt donned socks and shoes this session propping leg on bed with increased time (however without use of 2nd chair).  Pt ambulated to toilet with LBAC and completed toileting at overall mod I level.  Ambulated to ADL apt and completed tub/shower transfer with tub transfer bench.  Educated pt on safety in kitchen and adaptive techniques to transport items to increase safety.  Pt reports that her friend will most likely do the majority of the cooking.  Folded towels in sitting with focus on bimanual task to promote increased use of LUE.  Therapy Documentation Precautions:  Precautions Precautions: Fall Precaution Comments: L hemiparesis Restrictions Weight Bearing Restrictions: No Pain: Pain Assessment Pain Score: 0-No pain  See FIM for current functional status  Therapy/Group: Individual Therapy  Simonne Come 09/24/2014, 12:22 PM

## 2014-09-25 ENCOUNTER — Inpatient Hospital Stay (HOSPITAL_COMMUNITY): Payer: Self-pay | Admitting: Physical Therapy

## 2014-09-25 ENCOUNTER — Inpatient Hospital Stay (HOSPITAL_COMMUNITY): Payer: MEDICAID | Admitting: Occupational Therapy

## 2014-09-25 ENCOUNTER — Inpatient Hospital Stay (HOSPITAL_COMMUNITY): Payer: MEDICAID | Admitting: Speech Pathology

## 2014-09-25 ENCOUNTER — Inpatient Hospital Stay (HOSPITAL_COMMUNITY): Payer: MEDICAID | Admitting: Physical Therapy

## 2014-09-25 LAB — GLUCOSE, CAPILLARY
GLUCOSE-CAPILLARY: 125 mg/dL — AB (ref 70–99)
Glucose-Capillary: 118 mg/dL — ABNORMAL HIGH (ref 70–99)
Glucose-Capillary: 193 mg/dL — ABNORMAL HIGH (ref 70–99)
Glucose-Capillary: 194 mg/dL — ABNORMAL HIGH (ref 70–99)

## 2014-09-25 MED ORDER — ALPRAZOLAM 0.25 MG PO TABS: 0.2500 mg | ORAL_TABLET | Freq: Three times a day (TID) | ORAL | Status: DC | PRN

## 2014-09-25 MED ORDER — TRAMADOL HCL 50 MG PO TABS: 50.0000 mg | ORAL_TABLET | Freq: Three times a day (TID) | ORAL | Status: DC | PRN

## 2014-09-25 MED ORDER — ASPIRIN 325 MG PO TABS: 325.0000 mg | ORAL_TABLET | Freq: Every day | ORAL | Status: DC

## 2014-09-25 MED ORDER — GLIMEPIRIDE 4 MG PO TABS: 4.0000 mg | ORAL_TABLET | Freq: Every day | ORAL | Status: DC

## 2014-09-25 MED ORDER — LIDOCAINE 5 % EX PTCH: 1.0000 | MEDICATED_PATCH | CUTANEOUS | Status: DC

## 2014-09-25 MED ORDER — MIRTAZAPINE 7.5 MG PO TABS: 7.5000 mg | ORAL_TABLET | Freq: Every day | ORAL | Status: DC

## 2014-09-25 MED ORDER — DICLOFENAC SODIUM 1 % TD GEL: 2.0000 g | Freq: Three times a day (TID) | TRANSDERMAL | Status: DC

## 2014-09-25 MED ORDER — LISINOPRIL 20 MG PO TABS: 20.0000 mg | ORAL_TABLET | Freq: Every day | ORAL | Status: DC

## 2014-09-25 MED ORDER — AMLODIPINE BESYLATE 10 MG PO TABS: ORAL_TABLET | ORAL | Status: DC

## 2014-09-25 MED ORDER — PANTOPRAZOLE SODIUM 40 MG PO TBEC: 40.0000 mg | DELAYED_RELEASE_TABLET | Freq: Every day | ORAL | Status: DC

## 2014-09-25 MED ORDER — HYDROCHLOROTHIAZIDE 25 MG PO TABS: 25.0000 mg | ORAL_TABLET | Freq: Every day | ORAL | Status: DC

## 2014-09-25 MED ORDER — ATORVASTATIN CALCIUM 40 MG PO TABS: 40.0000 mg | ORAL_TABLET | Freq: Every day | ORAL | Status: DC

## 2014-09-25 NOTE — Discharge Instructions (Signed)
Inpatient Rehab Discharge Instructions  Diamond Rice Discharge date and time: No discharge date for patient encounter.   Activities/Precautions/ Functional Status: Activity: activity as tolerated Diet: Soft with diabetic restrictions Wound Care: none needed Functional status:  ___ No restrictions     ___ Walk up steps independently ___ 24/7 supervision/assistance   ___ Walk up steps with assistance ___ Intermittent supervision/assistance  ___ Bathe/dress independently ___ Walk with walker     ___ Bathe/dress with assistance ___ Walk Independently    ___ Shower independently _x STROKE/TIA DISCHARGE INSTRUCTIONS SMOKING Cigarette smoking nearly doubles your risk of having a stroke & is the single most alterable risk factor  If you smoke or have smoked in the last 12 months, you are advised to quit smoking for your health.  Most of the excess cardiovascular risk related to smoking disappears within a year of stopping.  Ask you doctor about anti-smoking medications  [place] Quit Line: 1-800-QUIT NOW  Free Smoking Cessation Classes [phone]  CHOLESTEROL Know your levels; limit fat & cholesterol in your diet  Lipid Panel     Component Value Date/Time   CHOL 196 08/28/2014 1605   TRIG 129 08/28/2014 1605   HDL 45 08/28/2014 1605   CHOLHDL 4.4 08/28/2014 1605   VLDL 26 08/28/2014 1605   LDLCALC 125* 08/28/2014 1605      Many patients benefit from treatment even if their cholesterol is at goal.  Goal: Total Cholesterol (CHOL) less than 160  Goal:  Triglycerides (TRIG) less than 150  Goal:  HDL greater than 40  Goal:  LDL (LDLCALC) less than 100   BLOOD PRESSURE American Stroke Association blood pressure target is less that 120/80 mm/Hg  Your discharge blood pressure is:  BP: 121/79 mmHg  Monitor your blood pressure  Limit your salt and alcohol intake  Many individuals will require more than one medication for high blood pressure  DIABETES (A1c is a blood sugar  average for last 3 months) Goal HGBA1c is under 7% (HBGA1c is blood sugar average for last 3 months)  Diabetes:     Lab Results  Component Value Date   HGBA1C 10.3* 08/28/2014     Your HGBA1c can be lowered with medications, healthy diet, and exercise.  Check your blood sugar as directed by your physician  Call your physician if you experience unexplained or low blood sugars.  PHYSICAL ACTIVITY/REHABILITATION Goal is 30 minutes at least 4 days per week  Activity: Increase activity slowly, Therapies: Physical Therapy: Home Health Return to work:   Activity decreases your risk of heart attack and stroke and makes your heart stronger.  It helps control your weight and blood pressure; helps you relax and can improve your mood.  Participate in a regular exercise program.  Talk with your doctor about the best form of exercise for you (dancing, walking, swimming, cycling).  DIET/WEIGHT Goal is to maintain a healthy weight  Your discharge diet is: DIET DYS 3  liquids Your height is:    Your current weight is: Weight: 105.461 kg (232 lb 8 oz) Your Body Mass Index (BMI) is:     Following the type of diet specifically designed for you will help prevent another stroke.  Your goal weight range is:    Your goal Body Mass Index (BMI) is 19-24.  Healthy food habits can help reduce 3 risk factors for stroke:  High cholesterol, hypertension, and excess weight.  RESOURCES Stroke/Support Group:  Call [facility]  TO PATIENT Stroke warning signs and symptoms How to activate emergency medical system (call 911). Medications prescribed at discharge. Need for follow-up after discharge. Personal risk factors for stroke. Pneumonia vaccine given:  Flu vaccine given:  My questions have been answered, the writing is legible, and I understand these instructions.  I will adhere to these goals & educational materials that have been provided to me after my  discharge from the hospital.    __ Walk with assistance    ___ Shower with assistance ___ No alcohol     ___ Return to work/school ________  COMMUNITY REFERRALS UPON DISCHARGE:   Home Health:   PT     OT     SW    Agency:  [place] [facility] Phone:  [phone] Medical Equipment/Items Ordered:  Tub bench and large base quad cane  Agency/Supplier:  [facility]        Phone:  [phone] Other:  [facility] and [facility]                    Monday, September 28, 2014 at 11:30 AM for follow up appointment and medication assistance                  Wednesday, September 30, 2014  Between 8:30 - 4:30 for eligibility appointment with Solmon Ice.  She said earlier is better for your wait time.  GENERAL COMMUNITY RESOURCES FOR PATIENT/FAMILY: Support Groups:   [place] [facility]                               3rd Sunday of every month at [facility] (except June, July, and August)                               In the dayroom of the [facility] on [facility]:  [place]                          [phone]                           Sonia Baller gave you financial aid application, although with your "Orange Card" program, your case manager may be able to get you in there or somewhere else for follow up on you anxiety and depression  Special Instructions:    My questions have been answered and I understand these instructions. I will adhere to these goals and the provided educational materials after my discharge from the hospital.  Patient/Caregiver Signature _______________________________ Date __________  Clinician Signature _______________________________________ Date __________  Please bring this form and your medication list with you to all your follow-up doctor's appointments.

## 2014-09-25 NOTE — Progress Notes (Signed)
Occupational Therapy Session Note  Patient Details  Name: Diamond Rice MRN: [id number] Date of Birth: 1971/02/18  Today's Date: 09/25/2014 OT Individual Time: 3546-5681 OT Individual Time Calculation (min): 60 min    Short Term Goals: Week 2:  OT Short Term Goal 1 (Week 2): STG = LTGs due to remaining LOS  Skilled Therapeutic Interventions/Progress Updates:    Completed ADL retraining at overall Mod I with self-care tasks of bathing and dressing.  Toilet and shower transfers completed with use of LBQC.  Pt completed bathing at sit > stand level in room shower and then dressed from EOB all at Mod I level.  Pt required increased time to don socks and shoes with AFO however able to complete these tasks by propping leg on bed.  Grooming tasks completed in standing at sink.  Reiterated importance on functional use of LUE during self-care tasks and reminded pt of HEP handouts and theraputty to increase ROM and motor control.  Pt with question about obtaining meds post d/c tomorrow, notified [name] who will follow up with pt.  Therapy Documentation Precautions:  Precautions Precautions: Fall Precaution Comments: L hemiparesis Restrictions Weight Bearing Restrictions: No Pain: Pain Assessment Pain Assessment: No/denies pain  See FIM for current functional status  Therapy/Group: Individual Therapy  Simonne Come 09/25/2014, 10:46 AM

## 2014-09-25 NOTE — Progress Notes (Signed)
Subjective/Complaints: Discussed D/C plans  Review of Systems - Negative except occ back pain   Objective: Vital Signs: Blood pressure 135/72, pulse 84, temperature 98.1 F (36.7 C), temperature source Oral, resp. rate 18, weight 106.7 kg (235 lb 3.7 oz), last menstrual period 08/28/2014, SpO2 100 %. No results found. Results for orders placed or performed during the hospital encounter of 09/11/14 (from the past 72 hour(s))  Glucose, capillary     Status: Abnormal   Collection Time: 09/22/14 12:07 PM  Result Value Ref Range   Glucose-Capillary 105 (H) 70 - 99 mg/dL  Glucose, capillary     Status: Abnormal   Collection Time: 09/22/14  4:47 PM  Result Value Ref Range   Glucose-Capillary 144 (H) 70 - 99 mg/dL  Glucose, capillary     Status: Abnormal   Collection Time: 09/22/14  8:53 PM  Result Value Ref Range   Glucose-Capillary 191 (H) 70 - 99 mg/dL  Glucose, capillary     Status: Abnormal   Collection Time: 09/23/14  6:38 AM  Result Value Ref Range   Glucose-Capillary 104 (H) 70 - 99 mg/dL  Glucose, capillary     Status: None   Collection Time: 09/23/14 11:29 AM  Result Value Ref Range   Glucose-Capillary 98 70 - 99 mg/dL   Comment 1 Notify RN   Glucose, capillary     Status: Abnormal   Collection Time: 09/23/14  5:02 PM  Result Value Ref Range   Glucose-Capillary 206 (H) 70 - 99 mg/dL   Comment 1 Notify RN   Glucose, capillary     Status: Abnormal   Collection Time: 09/23/14  9:03 PM  Result Value Ref Range   Glucose-Capillary 154 (H) 70 - 99 mg/dL  Glucose, capillary     Status: Abnormal   Collection Time: 09/24/14  6:48 AM  Result Value Ref Range   Glucose-Capillary 111 (H) 70 - 99 mg/dL  Glucose, capillary     Status: Abnormal   Collection Time: 09/24/14 12:21 PM  Result Value Ref Range   Glucose-Capillary 67 (L) 70 - 99 mg/dL  Glucose, capillary     Status: Abnormal   Collection Time: 09/24/14  4:50 PM  Result Value Ref Range   Glucose-Capillary 226 (H) 70 -  99 mg/dL  Glucose, capillary     Status: Abnormal   Collection Time: 09/24/14  8:42 PM  Result Value Ref Range   Glucose-Capillary 264 (H) 70 - 99 mg/dL   Comment 1 Notify RN   Glucose, capillary     Status: Abnormal   Collection Time: 09/25/14  6:45 AM  Result Value Ref Range   Glucose-Capillary 118 (H) 70 - 99 mg/dL     HEENT: normal Cardio: RRR and no murmur Resp: CTA B/L and unlabored GI: BS positive and NT, ND Extremity:  Pulses positive and No Edema Skin:   Intact  Musc/Skel: Tenderness along Right lower ribs, lateral aspect, no CVA tenderness Gen NAD   Assessment/Plan: 1. Functional deficits secondary to Left hemi secondary to Right Int Capsule infarct which require 3+ hours per day of interdisciplinary therapy in a comprehensive inpatient rehab setting. Physiatrist is providing close team supervision and 24 hour management of active medical problems listed below. Physiatrist and rehab team continue to assess barriers to discharge/monitor patient progress toward functional and medical goals. D/C in am D/C instructions this pm PMR f/u ~3wk  PCP f/u with [employer] program .FIM: FIM - Bathing Bathing Steps Patient Completed: Right Arm, Chest, Front perineal area, Right lower leg (including foot), Left lower leg (including foot), Buttocks, Right upper leg, Left Arm, Abdomen, Left upper leg Bathing: 6: Assistive device (Comment)  FIM - Upper Body Dressing/Undressing Upper body dressing/undressing steps patient completed: Thread/unthread right sleeve of pullover shirt/dresss, Thread/unthread left sleeve of pullover shirt/dress, Put head through opening of pull over shirt/dress, Pull shirt over trunk Upper body dressing/undressing: 6: More than reasonable amount of time FIM - Lower Body Dressing/Undressing Lower body dressing/undressing steps patient completed: Thread/unthread right underwear leg, Thread/unthread left underwear leg, Pull underwear  up/down, Thread/unthread right pants leg, Thread/unthread left pants leg, Pull pants up/down, Don/Doff right sock, Don/Doff left sock Lower body dressing/undressing: 6: More than reasonable amount of time  FIM - Toileting Toileting steps completed by patient: Adjust clothing prior to toileting, Performs perineal hygiene, Adjust clothing after toileting Toileting Assistive Devices: Grab bar or rail for support Toileting: 6: Assistive device: No helper  FIM - Radio producer Devices: Oncologist Transfers: 5-To toilet/BSC: Supervision (verbal cues/safety issues), 5-From toilet/BSC: Supervision (verbal cues/safety issues)  FIM - Control and instrumentation engineer Devices: Best boy: 6: Sit > Supine: No assist, 5: Chair or W/C > Bed: Supervision (verbal cues/safety issues)  FIM - Locomotion: Wheelchair Distance: 200 Locomotion: Wheelchair: 0: Activity did not occur FIM - Locomotion: Ambulation Locomotion: Ambulation Assistive Devices: Nurse, adult, Orthosis (L AFO) Ambulation/Gait Assistance: 5: Supervision, 4: Min guard Locomotion: Ambulation: 4: Travels 150 ft or more with minimal assistance (Pt.>75%)  Comprehension Comprehension Mode: Auditory Comprehension: 6-Follows complex conversation/direction: With extra time/assistive device  Expression Expression Mode: Verbal Expression: 5-Expresses complex 90% of the time/cues < 10% of the time  Social Interaction Social Interaction Mode: Asleep Social Interaction: 5-Interacts appropriately 90% of the time - Needs monitoring or encouragement for participation or interaction.  Problem Solving Problem Solving: 5-Solves basic problems: With no assist  Memory Memory Mode: Asleep Memory: 5-Recognizes or recalls 90% of the time/requires cueing < 10% of the time  Medical Problem List and Plan: 1. Functional deficits secondary to Right PLIC infarct/CR infarct 2.  DVT  Prophylaxis/Anticoagulation: Subcutaneous heparin. Monitor platelet counts or any signs of bleeding 3. Pain Management: Tylenol as needed, add Lidoderm for lateral rib pain, check rib films 4. Dysphagia. Mechanical soft diet. Follow-up speech therapy 5. Neuropsych: This patient is capable of making decisions on her own behalf.  -Behavior health follow up 6. Skin/Wound Care: Routine skin checks 7. Fluids/Electrolytes/Nutrition: Strict I and O follow-up chemistries 8. Hypertension. Norvasc 10 mg daily, hydrochlorothiazide 25 mg daily, lisinopril 20 mg daily. Monitor with increased mobility 9. Diabetes mellitus with peripheral neuropathy. Hemoglobin A1c 10.3. Currently with sliding scale insulin. Patient on CBGs improving on amaryl and carb mod diet although numbers can be variable.  -Appreciate diabetes coordinator note, doubt pt will comply with insulin administration.    -sugars improving  10. Hyperlipidemia. Lipitor 11. Medical noncompliance. Documentation of medical noncompliance and financial constraints prior to admission. Provide counseling and social work follow-up 12. History of polysubstance abuse. Counseling  LOS (Days) 14 A FACE TO FACE EVALUATION WAS PERFORMED  Diamond Rice E 09/25/2014, 7:05 AM

## 2014-09-25 NOTE — Progress Notes (Signed)
Occupational Therapy Discharge Summary  Patient Details  Name: Diamond Rice MRN: [id number] Date of Birth: Jan 01, 1971  Patient has met 36 of 49 long term goals due to improved activity tolerance, improved balance, postural control, ability to compensate for deficits, functional use of  LEFT upper and LEFT lower extremity and improved coordination.  Patient to discharge at overall Modified Independent level.  Patient to stay with friend until housing situation figured out, pt is at modified independent level so no education completed with friend.  Reasons goals not met: N/A  Recommendation:  Patient will benefit from ongoing skilled OT services in home health setting to continue to advance functional skills in the area of BADL and iADL.  Equipment: tub transfer bench  Reasons for discharge: treatment goals met and discharge from hospital  Patient/family agrees with progress made and goals achieved: Yes  OT Discharge Pain Pain Assessment Pain Assessment: No/denies pain ADL  See FIM Vision/Perception  Vision- History Baseline Vision/History: No visual deficits Patient Visual Report: No change from baseline Vision- Assessment Vision Assessment?: No apparent visual deficits  Cognition Overall Cognitive Status: Within Functional Limits for tasks assessed Arousal/Alertness: Awake/alert Orientation Level: Oriented X4 Attention: Alternating Selective Attention: Appears intact Alternating Attention: Appears intact Memory: Appears intact Awareness: Appears intact Problem Solving: Appears intact Safety/Judgment: Appears intact Sensation Sensation Light Touch: Impaired Detail Light Touch Impaired Details: Impaired LUE (reports feeling "a little lighter") Proprioception: Appears Intact Coordination Gross Motor Movements are Fluid and Coordinated: No Fine Motor Movements are Fluid and Coordinated: No Coordination and Movement Description: slowed movements Lt arm; only gross  movements noted - poor fine motor control, able to incorporate into functional tasks with increased time and focus Finger Nose Finger Test: slowed, impaired accuracy Lt arm, able to isolate movement to finger instead of gross hand movement on eval Extremity/Trunk Assessment RUE Assessment RUE Assessment: Within Functional Limits LUE Assessment LUE Assessment: Exceptions to [facility] LUE AROM (degrees) LUE Overall AROM Comments: shoulder flexion to 120 degrees, elbow flexion/extension WFL, wrist flexion/extension WFL, minimal finger extension when opening fist LUE Strength LUE Overall Strength: Deficits LUE Overall Strength Comments: shoulder flexion 3/5, elbow flexion/extension 4/5, decreased gross grasp  See FIM for current functional status  Zeferino Mounts, [name] 09/25/2014, 10:43 AM

## 2014-09-25 NOTE — Progress Notes (Signed)
Physical Therapy Discharge Summary  Patient Details  Name: Diamond Rice MRN: [id number] Date of Birth: Nov 20, 1970  Today's Date: 09/25/2014 PT Individual Time: 1300-1400 PT Individual Time Calculation (min): 60 min    Patient has met 8 of 8 long term goals due to improved activity tolerance, improved balance, improved postural control, increased strength, ability to compensate for deficits, functional use of  left lower extremity and improved coordination.  Patient to discharge at an ambulatory level Modified Independent.   Patient's care partner not necessary to provide assistance at discharge, as pt is discharging at modified independent level.  Reasons goals not met: N/A; all goals met or surpassed.  Recommendation:  Patient will benefit from ongoing skilled PT services in home health setting to continue to advance safe functional mobility, address ongoing impairments in stability/independence with functional mobility and minimize fall risk.  Equipment: LBQC and left ankle-foot orthotic  Reasons for discharge: treatment goals met and discharge from hospital  Patient/family agrees with progress made and goals achieved: Yes  PT Discharge Precautions/Restrictions Restrictions Weight Bearing Restrictions: No Pain Pain Assessment Pain Assessment: No/denies pain Vision/Perception    WNL; no change from baseline Cognition Overall Cognitive Status: Within Functional Limits for tasks assessed Arousal/Alertness: Awake/alert Orientation Level: Oriented X4 Attention: Alternating Selective Attention: Appears intact Alternating Attention: Appears intact Memory: Appears intact Awareness: Appears intact Problem Solving: Appears intact Safety/Judgment: Appears intact Sensation Sensation Light Touch: Impaired Detail Light Touch Impaired Details: Impaired LUE;Impaired LLE Proprioception: Appears Intact Additional Comments: Light touch dimiinished in LUE/LLE. Coordination Gross  Motor Movements are Fluid and Coordinated: No Fine Motor Movements are Fluid and Coordinated: No Heel Shin Test: Limited excursion bilaterally (limitation on L>R). Motor  Motor Motor: Other (comment) Motor - Discharge Observations: L hemiparesis  Mobility Bed Mobility Bed Mobility: Sit to Supine;Supine to Sit Supine to Sit: 6: Modified independent (Device/Increase time);HOB flat Sit to Supine: HOB flat;6: Modified independent (Device/Increase time) Transfers Transfers: Yes Sit to Stand: 6: Modified independent (Device/Increase time);From chair/3-in-1;From bed Stand to Sit: 6: Modified independent (Device/Increase time);To chair/3-in-1;To bed Stand Pivot Transfers: 6: Modified independent (Device/Increase time);Other (comment) (using LBQC) Locomotion  Ambulation Ambulation: Yes Ambulation/Gait Assistance: 6: Modified independent (Device/Increase time) Ambulation Distance (Feet): 150 Feet Assistive device: Large base quad cane;Other (Comment) (L AFO) Gait Gait: Yes Gait Pattern: Impaired Gait Pattern: Step-through pattern;Decreased weight shift to left Gait velocity: self-selected gait speed = .31 m/s. High Level Ambulation High Level Ambulation: Side stepping;Direction changes Side Stepping: Mod I using LBQC Direction Changes: Mod I using LBQC Sudden Stops: Mod I using LBQC Stairs / Additional Locomotion Stairs: Yes Stairs Assistance: 5: Supervision Stair Management Technique: Two rails;Step to pattern;Forwards Number of Stairs: 12 Height of Stairs: 6.5 Ramp: 6: Modified independent (Device);Other (comment) (using LBQC) Curb: Other (comment);5: Supervision (using [facility])  Trunk/Postural Assessment  Cervical Assessment Cervical Assessment: Within Functional Limits Thoracic Assessment Thoracic Assessment: Within Functional Limits Lumbar Assessment Lumbar Assessment: Within Functional Limits Postural Control Postural Control: Within Functional Limits   Balance Balance Balance Assessed: Yes Standardized Balance Assessment Standardized Balance Assessment: Berg Balance Test Berg Balance Test Sit to Stand: Able to stand without using hands and stabilize independently Standing Unsupported: Able to stand safely 2 minutes Sitting with Back Unsupported but Feet Supported on Floor or Stool: Able to sit safely and securely 2 minutes Stand to Sit: Sits safely with minimal use of hands Transfers: Able to transfer safely, minor use of hands Standing Unsupported with Eyes Closed: Able to stand 10 seconds safely Standing Ubsupported  with Feet Together: Able to place feet together independently and stand for 1 minute with supervision From Standing, Reach Forward with Outstretched Arm: Can reach confidently >25 cm (10") From Standing Position, Pick up Object from Floor: Able to pick up shoe, needs supervision From Standing Position, Turn to Look Behind Over each Shoulder: Looks behind from both sides and weight shifts well Turn 360 Degrees: Able to turn 360 degrees safely but slowly Standing Unsupported, Alternately Place Feet on Step/Stool: Able to stand independently and complete 8 steps >20 seconds Standing Unsupported, One Foot in Front: Able to take small step independently and hold 30 seconds (26 seconds) Standing on One Leg: Able to lift leg independently and hold equal to or more than 3 seconds Total Score: 47 Dynamic Sitting Balance Dynamic Sitting - Level of Assistance: 6: Modified independent (Device/Increase time) Dynamic Standing Balance Dynamic Standing - Balance Support: No upper extremity supported;During functional activity Dynamic Standing - Level of Assistance: 6: Modified independent (Device/Increase time) Dynamic Standing - Comments: See Berg Balance Scale above. Extremity Assessment  LUE AROM (degrees) LUE Overall AROM Comments: shoulder flexion to 120 degrees, elbow flexion/extension WFL, wrist flexion/extension WFL, minimal  finger extension when opening fist LUE Strength LUE Overall Strength: Deficits LUE Overall Strength Comments: shoulder flexion 3/5, elbow flexion/extension 4/5, decreased gross grasp RLE Assessment RLE Assessment: Within Functional Limits LLE Assessment LLE Assessment: Exceptions to [facility] LLE Strength LLE Overall Strength: Deficits Left Hip Flexion: 3-/5 Left Knee Flexion: 4/5 Left Knee Extension: 4/5 Left Ankle Dorsiflexion: 4-/5 Left Ankle Plantar Flexion: 3+/5  See FIM for current functional status  Moria Brophy, Malva Cogan 09/25/2014, 8:10 PM

## 2014-09-25 NOTE — Progress Notes (Signed)
Speech Language Pathology Daily Session Note  Patient Details  Name: Diamond Rice MRN: [id number] Date of Birth: [birth year]  Today's Date: 09/25/2014 SLP Individual Time: 1035-1105 SLP Individual Time Calculation (min): 30 min  Short Term Goals: Week 2: SLP Short Term Goal 1 (Week 2): Patient will utilize safe swallow strategies with regular textures and thin liquids with Mod I.   SLP Short Term Goal 2 (Week 2): Patient will return demonstration of oral motor exercises with use of visual aide with Mod I.  SLP Short Term Goal 3 (Week 2): Patient will produce intelligible speech at the conversational level with 90% accuracy with Min cues for use of compensatory strategies.  SLP Short Term Goal 4 (Week 2): Patient will return demonstration of 2 vocal hygiene techniques with Mod I   Skilled Therapeutic Interventions: Skilled treatment session focused on addressing wrap up of dysphagia and communication education. Patient able to independently verbalize all dysphagia, vocal hygiene and speech intelligibility compensatory strategies with use of external aids.  Patient continues to require Supervision cues for use of speech and vocal hygiene strategies, but is agreeable that she can continue to utilize strategies on her own; as a result, no follow up recommended at this time and patient educated on follow up with GI/ENT if problems persist.     FIM:  Comprehension Comprehension Mode: Auditory Comprehension: 7-Follows complex conversation/direction: With no assist Expression Expression Mode: Verbal Expression: 5-Expresses complex 90% of the time/cues < 10% of the time Social Interaction Social Interaction: 6-Interacts appropriately with others with medication or extra time (anti-anxiety, antidepressant). Problem Solving Problem Solving: 6-Solves complex problems: With extra time Memory Memory: 6-Assistive device: No helper FIM - Eating Eating Activity: 6: Swallowing techniques:  self-managed  Pain Pain Assessment Pain Assessment: No/denies pain Pain Score: 2   Therapy/Group: Individual Therapy  Carmelia Roller., CCC-SLP [phone]  [place] 09/25/2014, 12:16 PM

## 2014-09-25 NOTE — Progress Notes (Signed)
[place] Specialist  Spoke with patient about the [facility] orange card and establishing care at the [place] center. Follow up appointment made with [facility] for Monday Feb 1,2016 @ 11:30, patient will also meet me at the clinic to enroll for the orange card 09/30/14 during the walk in times. Patient verbalized understanding of this plan. Patient was also informed to contact me once discharged to go over the application before appointments. Orange card application provided. My contact information also given for any future questions or concerns. No other [place] Specialist needs identified at this time.

## 2014-09-25 NOTE — Discharge Summary (Signed)
Discharge summary job [phone]

## 2014-09-25 NOTE — Progress Notes (Signed)
Physical Therapy Session Note  Patient Details  Name: Diamond Rice MRN: [id number] Date of Birth: 1971-02-15  Today's Date: 09/25/2014 PT Individual Time: 9485-4627 PT Individual Time Calculation (min): 30 min   Short Term Goals: Week 2:  PT Short Term Goal 1 (Week 2): STG's = LTG's secondary to anticipated LOS.  Skilled Therapeutic Interventions/Progress Updates:    Therapeutic Activity: Pt demonstrates mod I toilet transfer with LBQC, including managing clothing and hygiene herself, but pt req significantly increased time.   Orthotic Fit/Management: Pt reports discomfort in L 1st MTP head - dorsal side. PT notes that pt's shoe fabric is thick and unflexible around the toe box and suspects that the addition of the AFO has created additional pressure of the dorsal MTP on the inflexible shoe material. PT removes pt's inner sole of the shoe and pt reports this abolishes her pain. However, pt is diabetic and likely needs the pressure relief of an inner sole between her foot and the AFO. PT explains pt could always size her shoe up one size on this side to make extra room for the AFO. Pt reports that she has a different pair of shoes at home that will work better for her AFO.  PT distributed a button hook to pt, demonstrated how to use it, and pt demonstrated return ability to use it to clasp jeans hook mod I.   Pt is safe to d/c home.    Therapy Documentation Precautions:  Precautions Precautions: Fall Precaution Comments: L hemiparesis Restrictions Weight Bearing Restrictions: No Pain: Pain Assessment Pain Assessment: 0-10 Pain Score: 10-Worst pain ever Pain Type: Acute pain Pain Location: Flank Pain Orientation: Right Pain Descriptors / Indicators: Aching Pain Onset: On-going Pain Intervention(s): Rest (PT offerred to have pt ask RN for meds, but pt refused.) Multiple Pain Sites: No  See FIM for current functional status  Therapy/Group: Individual  Therapy  Cathaleen Korol M 09/25/2014, 2:40 PM

## 2014-09-25 NOTE — Plan of Care (Signed)
Problem: RH Stairs Goal: LTG Patient will ambulate up and down stairs w/assist (PT) LTG: Patient will ambulate up and down # of stairs with assistance (PT)  Outcome: Completed/Met Date Met:  09/25/14 Goal surpassed, as pt was able to negotiate 12 stairs with 2 rails and supervision on discharge.

## 2014-09-25 NOTE — Progress Notes (Signed)
Orthopedic Tech Progress Note Patient Details:  Diamond Rice [birth year] [id number]  Patient ID: [name], female   DOB: 11-13-1970, 44 y.o.   MRN: [id number] Called in advanced brace order; spoke with Dyann Kief, [name] 09/25/2014, 2:05 PM

## 2014-09-26 LAB — GLUCOSE, CAPILLARY
GLUCOSE-CAPILLARY: 111 mg/dL — AB (ref 70–99)
GLUCOSE-CAPILLARY: 130 mg/dL — AB (ref 70–99)

## 2014-09-26 NOTE — Progress Notes (Signed)
Patient discharged home at 4:35 pm accompanied by her friend. Vital signs taken and recorded. Patient's stable. Ambulatory with four point cane. Patient's prescription with medication and discharged summary given. Patient's verbalized understanding. Dr. Asa Lente notified regarding the glucometer requested by the patient. No signs and symptoms of distress noted. Denies any pain.

## 2014-09-26 NOTE — Progress Notes (Signed)
Patient tearful, requesting PRN Xanax. Provided emotional support, "I've got Rice lot on my mind!" Will continue to monitor.Diamond Rice

## 2014-09-26 NOTE — Progress Notes (Addendum)
Patient discharged home at 4:45 pm accompanied by her friend. Vital signs taken and recorded. Patient's stable. Ambulatory with four point cane. Patient's prescription with medication and discharged summary given. Patient's verbalized understanding. Dr. Asa Lente notified regarding the glucometer requested by the patient No signs and symptoms of distress noted. Denies any pain.

## 2014-09-26 NOTE — Progress Notes (Signed)
Diamond Rice is a 44 y.o. female 06-Sep-1970 [id number]  Subjective: No new complaints. No new problems. Ready for DC later today!  Objective: Vital signs in last 24 hours: Temp:  [98.4 F (36.9 C)-98.6 F (37 C)] 98.4 F (36.9 C) (01/30 0537) Pulse Rate:  [76-80] 76 (01/30 0537) Resp:  [18-20] 18 (01/30 0537) BP: (119-139)/(70-88) 139/88 mmHg (01/30 0537) SpO2:  [99 %-100 %] 100 % (01/30 0537) Weight change:  Last BM Date: 09/25/14  Intake/Output from previous day: 01/29 0701 - 01/30 0700 In: 960 [P.O.:960] Out: -   Physical Exam General: No apparent distress   Lying supine - aphasia and HP without change Lungs: Normal effort. Lungs clear to auscultation, no crackles or wheezes. Cardiovascular: Regular rate and rhythm, no edema Neurological: No new neurological deficits Wounds: N/A      Lab Results: BMET    Component Value Date/Time   NA 133* 09/14/2014 0613   K 3.8 09/14/2014 0613   CL 99 09/14/2014 0613   CO2 31 09/14/2014 0613   GLUCOSE 183* 09/14/2014 0613   BUN 14 09/14/2014 0613   CREATININE 1.13* 09/14/2014 0613   CREATININE 1.11* 05/17/2012 1511   CALCIUM 9.2 09/14/2014 0613   GFRNONAA 59* 09/14/2014 0613   GFRAA 68* 09/14/2014 0613   CBC    Component Value Date/Time   WBC 10.1 09/14/2014 0613   RBC 5.09 09/14/2014 0613   HGB 11.7* 09/14/2014 0613   HCT 36.6 09/14/2014 0613   PLT 349 09/14/2014 0613   MCV 71.9* 09/14/2014 0613   MCH 23.0* 09/14/2014 0613   MCHC 32.0 09/14/2014 0613   RDW 15.5 09/14/2014 0613   LYMPHSABS 3.7 09/14/2014 0613   MONOABS 1.1* 09/14/2014 0613   EOSABS 0.4 09/14/2014 0613   BASOSABS 0.0 09/14/2014 0613   CBG's (last 3):   Recent Labs  09/25/14 1633 09/25/14 2038 09/26/14 0647  GLUCAP 193* 194* 111*   LFT's Lab Results  Component Value Date   ALT 20 09/14/2014   AST 19 09/14/2014   ALKPHOS 70 09/14/2014   BILITOT 0.6 09/14/2014    Studies/Results: No results found.  Medications:  I have reviewed  the patient's current medications. Scheduled Medications: . amLODipine  10 mg Oral Daily  . aspirin  325 mg Oral Daily  . atorvastatin  40 mg Oral q1800  . diclofenac sodium  2 g Topical TID WC  . glimepiride  4 mg Oral Q breakfast  . heparin  5,000 Units Subcutaneous 3 times per day  . hydrochlorothiazide  25 mg Oral Daily  . insulin aspart  0-15 Units Subcutaneous TID WC  . lidocaine  1 patch Transdermal Q24H  . lisinopril  20 mg Oral Daily  . mirtazapine  7.5 mg Oral QHS  . pantoprazole  40 mg Oral Daily   PRN Medications: acetaminophen, ALPRAZolam, alum & mag hydroxide-simeth, bisacodyl, ondansetron **OR** ondansetron (ZOFRAN) IV, senna-docusate, sodium phosphate, sorbitol, traMADol  Assessment/Plan: Principal Problem:   Right-sided lacunar infarction Active Problems:   Morbid obesity with BMI of 45.0-49.9, adult   Type 2 diabetes mellitus with complication   CVA (cerebral infarction)   Nausea without vomiting   Left hemiparesis   Length of stay, days: 15  See DC summary  For DC today as planned  Valerie A. Asa Lente, MD 09/26/2014, 9:38 AM

## 2014-09-26 NOTE — Progress Notes (Signed)
Rested quietly after PRN Xanax.Diamond Rice

## 2014-09-26 NOTE — Discharge Summary (Signed)
Diamond Rice, Diamond Rice               ACCOUNT NO.:  [id number]  MEDICAL RECORD NO.:  [id number]  LOCATION:  4M07C                        FACILITY:  [facility]  PHYSICIAN:  Charlett Blake, M.D.DATE OF BIRTH:  1970-09-11  DATE OF ADMISSION:  09/11/2014 DATE OF DISCHARGE:  09/26/2014                              DISCHARGE SUMMARY   DISCHARGE DIAGNOSES: 1. Functional deficits secondary to right posterior limb of the     internal capsule infarct with corona radiata infarct. 2. Subcutaneous heparin for deep venous thrombosis prophylaxis. 3. Pain management. 4. Dysphagia. 5. Diabetes mellitus with peripheral neuropathy. 6. Hypertension. 7. Hyperlipidemia. 8. Medical noncompliance. 9. History of polysubstance abuse. 10.Depression.  HISTORY OF PRESENT ILLNESS:  This is a 44 year old right-handed female with history of hypertension, diabetes mellitus, polysubstance abuse, and medical noncompliance due to financial hardship.  The patient was recently home, was had been living with friends.  Presented on September 08, 2014, with left-sided weakness, slurred speech.  The patient with recent admission on August 28, 2014, for chest pain, elevated blood pressure.  Negative troponins.  A urine drug screen was positive at that time for marijuana, other workup unremarkable.  MRI of the brain showed a small acute infarct posterior limb of the right internal capsule and corona radiata.  MRA without major occlusion or stenosis.  Recent echocardiogram with ejection fraction of 65%, left ventricular diastolic dysfunction.  Carotid Dopplers with no ICA stenosis.  The patient did not receive tPA.  Neurology Service was consulted, maintained on aspirin for CVA prophylaxis as well as subcutaneous heparin for DVT prophylaxis. Close monitoring of blood pressure.  Her diet had been advanced to a mechanical soft.  Physical and occupational therapy ongoing.  The patient was admitted for a comprehensive rehab  program.  PAST MEDICAL HISTORY:  See discharge diagnoses.  SOCIAL HISTORY:  Recently living with friends.  FUNCTIONAL STATUS:  Upon admission to rehab service was independent. She worked part-time as a Quarry manager at a [employer].  Functional status upon admission to rehab service was minimal assist to ambulate 100 feet, one-person handheld assistance.  Minimal guard sit to stand, min to mod assist for activities of daily living.  PHYSICAL EXAMINATION:  VITAL SIGNS:  Blood pressure 171/96, pulse 86, temperature 99, respirations 18. GENERAL:  This was an alert female.  Flat affect, but appropriate. Mildly slurred speech, but intelligible. LUNGS:  Clear to auscultation. CARDIAC:  Regular rate and rhythm. ABDOMEN:  Soft, nontender.  Good bowel sounds.  REHABILITATION HOSPITAL COURSE:  The patient was admitted to inpatient rehab services with therapies initiated on a 3-hour daily basis consisting of physical therapy, occupational therapy, speech therapy, and rehabilitation nursing.  The following issues were addressed during the patient's rehabilitation stay.  Pertaining to Ms. Albarran right PLIC infarct, corona radiata infarct remained stable, and remained on aspirin therapy.  Subcutaneous heparin for DVT prophylaxis.  Pain management with the use of Voltaren gel and a Lidoderm patch for some rib pain. Chest x-ray was negative.  Her diet had been advanced to a regular consistency.  Blood pressures well controlled on Norvasc, hydrochlorothiazide, and lisinopril with arrangements made to follow up with PCP.  She did have a  history of diabetes mellitus with peripheral neuropathy.  Hemoglobin A1c of 10.3.  She had been placed on Amaryl 4 mg at bedtime and full diabetic teaching.  She did have a history of medical noncompliance due to financial hardship, all issues in regard to this were discussed, providing counseling and social work follow up. History of polysubstance abuse, again  with counseling in regard to cessation of any illegal drug products.  Noted hospital depression, anxiety, placed on a trial of Remeron with good results.  Emotional support provided.  The patient received weekly collaborative interdisciplinary team conferences to discuss estimated length of stay, family teaching, and any barriers to discharge.  Focused on wide base quad cane for ambulation.  Simulated car transfers.  Overall strengthening.  The patient at the supervision level for mobility, needing steady assistance to recover during functional mobility, perform car transfers with supervision modified independence, activities of daily living modified independent with bathing and dressing, supervision with her ambulation in the room for her ADLs using quad cane.  The patient with excellent overall progress.  Plan was discharged to home with ongoing therapies dictated per rehab services.  DISCHARGE MEDICATIONS: 1. Xanax 0.25 mg p.o. t.i.d. as needed. 2. Norvasc 10 mg p.o. daily. 3. Aspirin 325 mg p.o. daily. 4. Lipitor 40 mg p.o. daily. 5. Voltaren gel t.i.d. to affected areas. 6. Amaryl 4 mg p.o. daily. 7. Hydrochlorothiazide 25 mg p.o. daily. 8. Lidoderm patch daily. 9. Lisinopril 20 mg p.o. daily. 10.Remeron 7.5 mg p.o. at bedtime. 11.Protonix 40 mg p.o. daily. 12.Ultram 50 mg p.o. t.i.d. as needed.  DIET:  Diabetic diet.  SPECIAL INSTRUCTIONS:  The patient would follow up with Dr. Alysia Penna at the outpatient rehab service office on October 26, 2014; Dr. Leonie Man, Neurology Service in 1 month, call for appointment. Arrangements to be made to follow up with PCP Dr. Lajuana Ripple.  Ongoing therapies dictated per rehab services.     Lauraine Rinne, P.A.   ______________________________ Charlett Blake, M.D.    DA/MEDQ  D:  09/25/2014  T:  09/26/2014  Job:  [id number]  cc:   Ronnie Doss, DO Pramod P. Leonie Man, MD

## 2014-09-27 NOTE — Progress Notes (Signed)
Speech Language Pathology Discharge Summary  Patient Details  Name: Diamond Rice MRN: [id number] Date of Birth: 19-Jan-1971   Patient has met 4 of 4 long term goals.  Patient to discharge at overall Modified Independent level.  Reasons goals not met: n/a   Clinical Impression/Discharge Summary:  Pt made functional gains while inpatient and is discharging have met 4 out of 4 long term goals due to improved use of vocal hygiene techniques and dysarthria strategies to improve speech intelligibility to >90% at the conversational level.  Currently, pt is mod I for use of all compensatory strategies for functional communication.  While pt continues to present with decreased breath support for speech and harsh, strained vocal quality, SLP does not recommend ST follow up given pt's independence for compensation.  All pt education is complete at this time.    Care Partner:  Caregiver Able to Provide Assistance: Yes  Type of Caregiver Assistance: Physical  Recommendation:  None      Equipment: none recommended by SLP    Reasons for discharge: Discharged from hospital   Patient/Family Agrees with Progress Made and Goals Achieved: Yes   See FIM for current functional status  Emilio Math 09/27/2014, 9:34 PM

## 2014-09-28 ENCOUNTER — Encounter: Payer: Self-pay | Admitting: Physician Assistant

## 2014-09-28 ENCOUNTER — Ambulatory Visit: Payer: MEDICAID | Attending: Internal Medicine | Admitting: Physician Assistant

## 2014-09-28 VITALS — BP 135/95 | HR 91 | Temp 98.8°F | Resp 18 | Ht 61.0 in | Wt 230.8 lb

## 2014-09-28 DIAGNOSIS — E1140 Type 2 diabetes mellitus with diabetic neuropathy, unspecified: Secondary | ICD-10-CM

## 2014-09-28 LAB — CBC WITH DIFFERENTIAL/PLATELET
BASOS PCT: 1 % (ref 0–1)
Basophils Absolute: 0.1 10*3/uL (ref 0.0–0.1)
EOS PCT: 4 % (ref 0–5)
Eosinophils Absolute: 0.3 10*3/uL (ref 0.0–0.7)
HCT: 38.6 % (ref 36.0–46.0)
Hemoglobin: 12.2 g/dL (ref 12.0–15.0)
LYMPHS ABS: 2.4 10*3/uL (ref 0.7–4.0)
Lymphocytes Relative: 32 % (ref 12–46)
MCH: 23.1 pg — AB (ref 26.0–34.0)
MCHC: 31.6 g/dL (ref 30.0–36.0)
MCV: 73.2 fL — AB (ref 78.0–100.0)
MPV: 10.3 fL (ref 8.6–12.4)
Monocytes Absolute: 0.6 10*3/uL (ref 0.1–1.0)
Monocytes Relative: 8 % (ref 3–12)
NEUTROS ABS: 4.2 10*3/uL (ref 1.7–7.7)
Neutrophils Relative %: 55 % (ref 43–77)
Platelets: 403 10*3/uL — ABNORMAL HIGH (ref 150–400)
RBC: 5.27 MIL/uL — ABNORMAL HIGH (ref 3.87–5.11)
RDW: 16 % — AB (ref 11.5–15.5)
WBC: 7.6 10*3/uL (ref 4.0–10.5)

## 2014-09-28 LAB — COMPLETE METABOLIC PANEL WITH GFR
ALBUMIN: 3.9 g/dL (ref 3.5–5.2)
ALT: 14 U/L (ref 0–35)
AST: 15 U/L (ref 0–37)
Alkaline Phosphatase: 78 U/L (ref 39–117)
BUN: 18 mg/dL (ref 6–23)
CO2: 27 mEq/L (ref 19–32)
Calcium: 9.6 mg/dL (ref 8.4–10.5)
Chloride: 99 mEq/L (ref 96–112)
Creat: 1.24 mg/dL — ABNORMAL HIGH (ref 0.50–1.10)
GFR, EST NON AFRICAN AMERICAN: 54 mL/min — AB
GFR, Est African American: 62 mL/min
Glucose, Bld: 137 mg/dL — ABNORMAL HIGH (ref 70–99)
Potassium: 4.4 mEq/L (ref 3.5–5.3)
SODIUM: 137 meq/L (ref 135–145)
Total Bilirubin: 0.4 mg/dL (ref 0.2–1.2)
Total Protein: 7.7 g/dL (ref 6.0–8.3)

## 2014-09-28 NOTE — Progress Notes (Signed)
Diamond Rice  YIR:[id number]  JKK:[id number]  DOB - 1971-02-18  Chief Complaint  Patient presents with  . Hospitalization Follow-up       Subjective:   Diamond Rice is a 44 y.o. female here today for establishment of care. She was hospitalized from 1/12 through 09/11/2014. She went straight from the acute body of the hospital to inpatient rehabilitation thru January 30th for stroke. She continues with left-sided deficits in the upper and lower extremity. She still has some left facial droop. She has had a home health nurse coming out to check on her as well as physical therapy. Since hospitalization she has been more compliant with her medications. However, she has not taken them this morning.  She also at the beginning of January had a hospitalization for accelerated hypertension. Acute coronary syndrome was ruled out. Her echo showed normal LV function. She did have some diastolic dysfunction. She was noncompliant with medications post discharge. She does confirm that she's been compliant since her discharge on the 30th.   ROS: GEN: denies fever or chills, denies change in weight Skin: denies lesions or rashes HEENT: denies headache, earache, epistaxis, sore throat, or neck pain LUNGS: denies SHOB, dyspnea, PND, orthopnea CV: denies CP or palpitations ABD: denies abd pain, N or V EXT: denies muscle spasms or swelling; no pain in lower ext, no weakness NEURO: Positive weakness in upper and lower extremity greater than right   ALLERGIES: Allergies  Allergen Reactions  . Morphine And Related Hives    PAST MEDICAL HISTORY: Past Medical History  Diagnosis Date  . Hypertension   . Type II diabetes mellitus   . Anginal pain   . High cholesterol   . Shortness of breath 04/17/2012    "all the time"  . Headache(784.0) 04/17/2012    "~ qod; lately waking up in am w/one"  . Migraines 04/17/2012  . Chronic lower back pain 04/17/2012    "just got over some; catched when I walked"   . Depression   . Anxiety   . Sleep apnea   . Obesity   . Stroke     PAST SURGICAL HISTORY: Past Surgical History  Procedure Laterality Date  . Cardiac catheterization  ~ 2011    MEDICATIONS AT HOME: Prior to Admission medications   Medication Sig Start Date End Date Taking? Authorizing Provider  acetaminophen (TYLENOL) 325 MG tablet Take 650 mg by mouth every 6 (six) hours as needed for headache.    Historical Provider, MD  ALPRAZolam Duanne Moron) 0.25 MG tablet Take 1 tablet (0.25 mg total) by mouth 3 (three) times daily as needed for anxiety. 09/25/14   Lavon Paganini Angiulli, PA-C  amLODipine (NORVASC) 10 MG tablet TAKE 1 TABLET BY MOUTH DAILY FOR HYPERTENSION 09/25/14   Lavon Paganini Angiulli, PA-C  aspirin 325 MG tablet Take 1 tablet (325 mg total) by mouth daily. 09/25/14   Lavon Paganini Angiulli, PA-C  atorvastatin (LIPITOR) 40 MG tablet Take 1 tablet (40 mg total) by mouth daily at 6 PM. 09/25/14   Lavon Paganini Angiulli, PA-C  diclofenac sodium (VOLTAREN) 1 % GEL Apply 2 g topically 3 (three) times daily with meals. 09/25/14   Lavon Paganini Angiulli, PA-C  glimepiride (AMARYL) 4 MG tablet Take 1 tablet (4 mg total) by mouth daily with breakfast. 09/25/14   Lavon Paganini Angiulli, PA-C  hydrochlorothiazide (HYDRODIURIL) 25 MG tablet Take 1 tablet (25 mg total) by mouth daily. 09/25/14   Lavon Paganini Angiulli, PA-C  ibuprofen (ADVIL,MOTRIN) 200 MG tablet Take  800 mg by mouth 2 (two) times daily as needed (pain).    Historical Provider, MD  lidocaine (LIDODERM) 5 % Place 1 patch onto the skin daily. Remove & Discard patch within 12 hours or as directed by MD 09/25/14   Lavon Paganini Angiulli, PA-C  lisinopril (PRINIVIL,ZESTRIL) 20 MG tablet Take 1 tablet (20 mg total) by mouth daily. 09/25/14   Lavon Paganini Angiulli, PA-C  mirtazapine (REMERON) 7.5 MG tablet Take 1 tablet (7.5 mg total) by mouth at bedtime. 09/25/14   Lavon Paganini Angiulli, PA-C  pantoprazole (PROTONIX) 40 MG tablet Take 1 tablet (40 mg total) by mouth daily. 09/25/14    Lavon Paganini Angiulli, PA-C  senna-docusate (SENOKOT-S) 8.6-50 MG per tablet Take 1 tablet by mouth at bedtime as needed for mild constipation. 09/11/14   Lavon Paganini, MD  traMADol (ULTRAM) 50 MG tablet Take 1 tablet (50 mg total) by mouth 3 (three) times daily as needed for severe pain. 09/25/14   Lavon Paganini Angiulli, PA-C     Objective:   Filed Vitals:   09/28/14 1001  BP: 135/95  Pulse: 91  Temp: 98.8 F (37.1 C)  TempSrc: Oral  Resp: 18  Height: 5\' 1"  (1.549 m)  Weight: 230 lb 12.8 oz (104.69 kg)  SpO2: 99%    Exam General appearance : Awake, alert, not in any distress. Speech Clear. Not toxic looking HEENT: Atraumatic and Normocephalic, pupils equally reactive to light and accomodation Neck: supple, no JVD. No cervical lymphadenopathy.  Chest:Good air entry bilaterally, no added sounds  CVS: S1 S2 regular, no murmurs.  Abdomen: Bowel sounds present, Non tender and not distended with no gaurding, rigidity or rebound. Extremities: B/L Lower Ext shows no edema, both legs are warm to touch Neurology: Awake alert, and oriented X 3, CN II-XII intact, Non focal Skin:No Rash Wounds:N/A  Data Review Lab Results  Component Value Date   HGBA1C 10.3* 08/28/2014   HGBA1C 10.0 08/13/2013   HGBA1C 10.2* 05/23/2013     Assessment & Plan  1. Acute CVA  -Continue to modify risk factors  -Keep appointment with neurology in one month  -Keep her appointment with rehabilitation medicine for 2 months  -Home health physical therapy and nursing  -Check CBC and BMP today 2. Hypertension   -Continue current medications 3. Diabetes mellitus type 2-uncontrolled, noncompliant  -Continue current medication  -No adjustments made because she is just started back on her hyperglycemic regimen  -She is to pick up a glucometer meter from [facility] today 4. Hyperlipidemia  -Continue current medications 5. History of polysubstance abuse  -Continue abstinence discussed   Return in about 2  weeks (around 10/12/2014).  The patient was given clear instructions to go to ER or return to medical center if symptoms don't improve, worsen or new problems develop. The patient verbalized understanding. The patient was told to call to get lab results if they haven't heard anything in the next week.   This note has been created with Surveyor, quantity. Any transcriptional errors are unintentional.    Zettie Pho, PA-C [facility] and [facility], [place]   09/28/2014, 12:10 PM

## 2014-09-28 NOTE — Progress Notes (Signed)
Social Work Discharge Note  The overall goal for the admission was met for:   Discharge location: Yes - home to friend's home  Length of Stay: Yes - 15 days  Discharge activity level: Yes - modified independent  Home/community participation: Yes  Services provided included: MD, RD, PT, OT, SLP, RN, Pharmacy, Neuropsych and SW  Financial Services: Other: Pt applied for Medicaid while in the hospital.  Follow-up services arranged: Home Health: PT, OT, and SW from [facility] and DME: large base quad cane and tub bench from [facility] (or additional information):  Pt is discharging to her friend's home Film/video editor) at [address] [place] [address].  Pt is unsure how long she can stay there since she will not have any income to share with the friend.  CSW has connected pt with [facility] so that she can have a case manager in the community to help her navigate the medical care/system she is going to need and to assist with permanent housing.  CSW also set pt up with the [facility] program for her medications and went to the [facility] to get them for her so it would be one less thing for her to worry about on the day of d/c.    Patient/Family verbalized understanding of follow-up arrangements: Yes  Individual responsible for coordination of the follow-up plan: pt  Confirmed correct DME delivered: Katrinka Herbison, [name] 09/28/2014    Kyre Jeffries, [name]

## 2014-09-28 NOTE — Progress Notes (Signed)
Pt presents to clinic for follow after hospitalization for stroke. No complaints today.

## 2014-09-28 NOTE — Patient Instructions (Signed)
Make an appointment with the neurologist for 2-3 week from now Keep appt 2/29 with rehab medicine Return here in 2 weeks Bloodwork today Pickup your glucometer from [facility] today Take all medicines as prescribed

## 2014-10-08 ENCOUNTER — Telehealth: Payer: Self-pay | Admitting: Family Medicine

## 2014-10-08 NOTE — Telephone Encounter (Signed)
Nurse from [facility] called to request verbal orders for nurse visits. Please f/u.

## 2014-10-08 NOTE — Telephone Encounter (Signed)
Verbal order for RN visit given.  Please call back to RN.

## 2014-10-09 ENCOUNTER — Telehealth: Payer: Self-pay | Admitting: *Deleted

## 2014-10-09 NOTE — Telephone Encounter (Signed)
Unable to give VO to RN  No phone number given

## 2014-10-13 DIAGNOSIS — E669 Obesity, unspecified: Secondary | ICD-10-CM

## 2014-10-13 DIAGNOSIS — F1920 Other psychoactive substance dependence, uncomplicated: Secondary | ICD-10-CM

## 2014-10-13 DIAGNOSIS — F17210 Nicotine dependence, cigarettes, uncomplicated: Secondary | ICD-10-CM

## 2014-10-13 DIAGNOSIS — E119 Type 2 diabetes mellitus without complications: Secondary | ICD-10-CM

## 2014-10-13 DIAGNOSIS — Z8673 Personal history of transient ischemic attack (TIA), and cerebral infarction without residual deficits: Secondary | ICD-10-CM

## 2014-10-13 DIAGNOSIS — G629 Polyneuropathy, unspecified: Secondary | ICD-10-CM

## 2014-10-13 DIAGNOSIS — I10 Essential (primary) hypertension: Secondary | ICD-10-CM

## 2014-10-14 ENCOUNTER — Ambulatory Visit: Payer: Self-pay | Attending: Family Medicine | Admitting: Family Medicine

## 2014-10-14 ENCOUNTER — Telehealth: Payer: Self-pay | Admitting: Family Medicine

## 2014-10-14 ENCOUNTER — Encounter: Payer: Self-pay | Admitting: Family Medicine

## 2014-10-14 ENCOUNTER — Telehealth: Payer: Self-pay | Admitting: *Deleted

## 2014-10-14 VITALS — BP 143/89 | HR 92 | Temp 98.6°F | Resp 16 | Ht 61.0 in | Wt 236.0 lb

## 2014-10-14 DIAGNOSIS — E1165 Type 2 diabetes mellitus with hyperglycemia: Secondary | ICD-10-CM

## 2014-10-14 DIAGNOSIS — E119 Type 2 diabetes mellitus without complications: Secondary | ICD-10-CM

## 2014-10-14 DIAGNOSIS — I69354 Hemiplegia and hemiparesis following cerebral infarction affecting left non-dominant side: Secondary | ICD-10-CM | POA: Insufficient documentation

## 2014-10-14 DIAGNOSIS — E118 Type 2 diabetes mellitus with unspecified complications: Secondary | ICD-10-CM

## 2014-10-14 DIAGNOSIS — I6381 Other cerebral infarction due to occlusion or stenosis of small artery: Secondary | ICD-10-CM

## 2014-10-14 DIAGNOSIS — I10 Essential (primary) hypertension: Secondary | ICD-10-CM

## 2014-10-14 DIAGNOSIS — IMO0002 Reserved for concepts with insufficient information to code with codable children: Secondary | ICD-10-CM

## 2014-10-14 DIAGNOSIS — I639 Cerebral infarction, unspecified: Secondary | ICD-10-CM

## 2014-10-14 LAB — GLUCOSE, POCT (MANUAL RESULT ENTRY): POC Glucose: 153 mg/dl — AB (ref 70–99)

## 2014-10-14 MED ORDER — AMLODIPINE BESYLATE 10 MG PO TABS: ORAL_TABLET | ORAL | Status: DC

## 2014-10-14 MED ORDER — GLIMEPIRIDE 4 MG PO TABS
8.0000 mg | ORAL_TABLET | Freq: Every day | ORAL | Status: DC
Start: 2014-10-14 — End: 2015-10-21

## 2014-10-14 MED ORDER — MIRTAZAPINE 7.5 MG PO TABS: 7.5000 mg | ORAL_TABLET | Freq: Every day | ORAL | Status: DC

## 2014-10-14 MED ORDER — DICLOFENAC SODIUM 1 % TD GEL: 2.0000 g | Freq: Three times a day (TID) | TRANSDERMAL | Status: DC

## 2014-10-14 MED ORDER — LIDOCAINE 5 % EX PTCH: 1.0000 | MEDICATED_PATCH | CUTANEOUS | Status: DC

## 2014-10-14 MED ORDER — PANTOPRAZOLE SODIUM 40 MG PO TBEC: 40.0000 mg | DELAYED_RELEASE_TABLET | Freq: Every day | ORAL | Status: DC

## 2014-10-14 MED ORDER — INSULIN GLARGINE 300 UNIT/ML ~~LOC~~ SOPN: 10.0000 [IU] | PEN_INJECTOR | Freq: Every day | SUBCUTANEOUS | Status: DC

## 2014-10-14 MED ORDER — GLUCOSE BLOOD VI STRP: 1.0000 | ORAL_STRIP | Freq: Three times a day (TID) | Status: DC

## 2014-10-14 MED ORDER — LISINOPRIL-HYDROCHLOROTHIAZIDE 20-12.5 MG PO TABS: 2.0000 | ORAL_TABLET | Freq: Every day | ORAL | Status: DC

## 2014-10-14 MED ORDER — ATORVASTATIN CALCIUM 40 MG PO TABS: 40.0000 mg | ORAL_TABLET | Freq: Every day | ORAL | Status: DC

## 2014-10-14 MED ORDER — INSULIN PEN NEEDLE 31G X 8 MM MISC: 1.0000 "application " | Freq: Every day | Status: DC

## 2014-10-14 NOTE — Patient Instructions (Signed)
Ms. Kroner,   Thank you for coming in today. It was a pleasure meeting you. I look forward to being your primary doctor.   1. Diabetes:  Goal A1c is < 7  To get to  Goal  Continue amaryl increase to 8 mg after breakfast Start toujeo 10 U in the morning   Diabetes  Check blood sugar  (2 times per day) and before meals  Goal fasting 100  Goal after eating < 160 Beware of hypoglycemia (low blood sugar) which is blood sugar < 70 with or without symptoms   2. HTN: near goal of < 140/90 at all times To get to goal Increase lisinopril ftrom 20 mg to 40 mg daily  Change to prinzide =lisinoprl (20) + HCTZ (12.5) take two tablets once daily  Continue norvasc 10 mg daily   F/u in 3 weeks for blood sugar log and for BP check   F/u with me in 6 week  Dr. Adrian Blackwater

## 2014-10-14 NOTE — Telephone Encounter (Signed)
Physical therapist called checking the status of verbal order for nurse visits. Please f/u to phone # provided.

## 2014-10-14 NOTE — Progress Notes (Signed)
Patient hospitalized at [facility] January 12-30, 2016 for CVA Patient needs Rx for glucose test strips she did not bring her CBG log Patient got free CBG meter at the hospital Patient needs medication refills

## 2014-10-14 NOTE — Assessment & Plan Note (Signed)
1. Diabetes:  Goal A1c is < 7  To get to  Goal  Continue amaryl increase to 8 mg after breakfast Start toujeo 10 U in the morning   Diabetes  Check blood sugar  (2 times per day) and before meals  Goal fasting 100  Goal after eating < 160 Beware of hypoglycemia (low blood sugar) which is blood sugar < 70 with or without symptoms

## 2014-10-14 NOTE — Telephone Encounter (Signed)
Diamond Rice from [facility] called about the Plan of care paperwork that she faxed to our office.  Can we please fax it back

## 2014-10-14 NOTE — Progress Notes (Signed)
   Subjective:    Patient ID: Diamond Rice, female    DOB: [birth year], 44 y.o.   MRN: [id number] CC: diabetes follow up  HPI 44 yo F with recent R sided CVA presents for DM2 f/u:  1. CHRONIC DIABETES  Disease Monitoring  Blood Sugar Ranges: does not check, out of meter   Polyuria: no   Visual problems: no   Medication Compliance: yes  Medication Side Effects  Hypoglycemia: no   Preventitive Health Care  Eye Exam: due   Foot Exam: done today   2. CHRONIC HYPERTENSION  Disease Monitoring  Blood pressure range: 130-150/80-90 at home   Chest pain: no   Dyspnea: no   Claudication: no   Medication compliance: yes  Medication Side Effects  Lightheadedness: no   Urinary frequency: no   Edema: no   Soc hx:  Denies cocaine, THC and cigarette smoking  Review of Systems As per HPI  Has some residual L sided weakness     Objective:   Physical Exam BP 143/89 mmHg  Pulse 92  Temp(Src) 98.6 F (37 C)  Resp 16  Ht 5\' 1"  (1.549 m)  Wt 236 lb (107.049 kg)  BMI 44.61 kg/m2  SpO2 99%  LMP 09/15/2014 (Approximate)  BP Readings from Last 3 Encounters:  10/14/14 143/89  09/28/14 135/95  09/26/14 140/91  General appearance: alert, cooperative and no distress Lungs: clear to auscultation bilaterally Heart: regular rate and rhythm, S1, S2 normal, no murmur, click, rub or gallop Extremities: extremities normal, atraumatic, no cyanosis or edema  Diabetic foot exam done and normal except for debris between 4th and 5th toes b/l     Assessment & Plan:

## 2014-10-14 NOTE — Assessment & Plan Note (Addendum)
HTN: near goal of < 140/90 at all times To get to goal Increase lisinopril ftrom 20 mg to 40 mg daily  Change to prinzide =lisinoprl (20) + HCTZ (12.5) take two tablets once daily

## 2014-10-20 ENCOUNTER — Telehealth: Payer: Self-pay | Admitting: *Deleted

## 2014-10-20 NOTE — Telephone Encounter (Signed)
[facility] notified and approval given.

## 2014-10-20 NOTE — Telephone Encounter (Signed)
Asking for verbal orders for home health nurse

## 2014-10-26 ENCOUNTER — Ambulatory Visit (HOSPITAL_BASED_OUTPATIENT_CLINIC_OR_DEPARTMENT_OTHER): Payer: Self-pay | Admitting: Physical Medicine & Rehabilitation

## 2014-10-26 ENCOUNTER — Encounter: Payer: Self-pay | Attending: Physical Medicine & Rehabilitation

## 2014-10-26 ENCOUNTER — Encounter: Payer: Self-pay | Admitting: Physical Medicine & Rehabilitation

## 2014-10-26 VITALS — BP 144/82 | HR 95 | Resp 14

## 2014-10-26 DIAGNOSIS — I639 Cerebral infarction, unspecified: Secondary | ICD-10-CM | POA: Insufficient documentation

## 2014-10-26 DIAGNOSIS — I6381 Other cerebral infarction due to occlusion or stenosis of small artery: Secondary | ICD-10-CM

## 2014-10-26 DIAGNOSIS — G8190 Hemiplegia, unspecified affecting unspecified side: Secondary | ICD-10-CM | POA: Insufficient documentation

## 2014-10-26 DIAGNOSIS — G8194 Hemiplegia, unspecified affecting left nondominant side: Secondary | ICD-10-CM

## 2014-10-26 DIAGNOSIS — Z6841 Body Mass Index (BMI) 40.0 and over, adult: Secondary | ICD-10-CM | POA: Insufficient documentation

## 2014-10-26 NOTE — Patient Instructions (Signed)
If you need therapy orders for outpatient therapy please call my office once transportation is approved

## 2014-10-26 NOTE — Progress Notes (Signed)
Subjective:    Patient ID: Diamond Rice, female    DOB: 1971/08/04, 44 y.o.   MRN: [id number] This is a 44 year old right-handed female with history of hypertension, diabetes mellitus, polysubstance abuse, and medical noncompliance due to financial hardship.  The patient was recently home, was had been living with friends.  Presented on September 08, 2014, with left-sided weakness, slurred speech.  The patient with recent admission on August 28, 2014, for chest pain, elevated blood pressure.  Negative troponins.  A urine drug screen was positive at that time for marijuana, other workup unremarkable.  MRI of the brain showed a small acute infarct posterior limb of the right internal capsule and corona radiata.  MRA without major occlusion or stenosis.  Recent echocardiogram with ejection fraction of 65%, left ventricular diastolic dysfunction.  Carotid Dopplers with no ICA stenosis.  The patient did not receive tPA.  Neurology Service was consulted, maintained on aspirin for CVA prophylaxis as well as subcutaneous heparin for DVT prophylaxis. Close monitoring of blood pressure.  Her diet had been advanced to a mechanical soft.  Physical and occupational therapy ongoing.  The patient was admitted for a comprehensive rehab program.  DATE OF ADMISSION:  09/11/2014 DATE OF DISCHARGE:  09/26/2014  HPI  Currently receiving twice a week PT OT at home.  Independent with bathing and dressing Ambulating with left AFO as well as quad cane, no reported falls  Does not have transportation for outpatient Has followed up with primary care physician Dr. Adrian Blackwater   Pain Inventory Average Pain 9 Pain Right Now 7 My pain is .  In the last 24 hours, has pain interfered with the following? General activity 7 Relation with others 0 Enjoyment of life 7 What TIME of day is your pain at its worst? night Sleep (in general) Poor  Pain is worse with: walking, sitting and standing Pain improves  with: medication Relief from Meds: 4  Mobility walk with assistance use a cane ability to climb steps?  yes do you drive?  no  Function not employed: date last employed .  Neuro/Psych weakness trouble walking depression anxiety suicidal thoughts, patient does not have current active plan, she admits to having one in the past. Pt is being seen by behavorial health  Prior Studies hospital f/u  Physicians involved in your care hospital f/u   Family History  Problem Relation Age of Onset  . Stroke Brother 80  . Stroke Brother 62  . Heart attack Mother 60   History   Social History  . Marital Status: Single    Spouse Name: N/A  . Number of Children: N/A  . Years of Education: N/A   Social History Main Topics  . Smoking status: Former Smoker -- 0.08 packs/day for 10 years    Types: Cigarettes    Quit date: 08/04/2013  . Smokeless tobacco: Never Used  . Alcohol Use: Yes     Comment: rare  . Drug Use: No  . Sexual Activity: Not Currently   Other Topics Concern  . None   Social History Narrative   CNA at [employer].  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   Past Surgical History  Procedure Laterality Date  . Cardiac catheterization  ~ 2011   Past Medical History  Diagnosis Date  . Hypertension   . Anginal pain   . High cholesterol   . Shortness of breath 04/17/2012    "all the time"  . Headache(784.0) 04/17/2012    "~  qod; lately waking up in am w/one"  . Migraines 04/17/2012  . Chronic lower back pain 04/17/2012    "just got over some; catched when I walked"  . Depression   . Anxiety   . Sleep apnea   . Obesity   . Stroke   . Type II diabetes mellitus 08/28/2002   BP 144/82 mmHg  Pulse 95  Resp 14  SpO2 99%  LMP 09/15/2014 (Approximate)  Opioid Risk Score:   Fall Risk Score:    Review of Systems  Constitutional:       Night sweats Poor appetite  Respiratory: Positive for apnea and shortness of breath.   Gastrointestinal: Positive for  constipation.  Endocrine:       High blood sugar   Musculoskeletal: Positive for gait problem.  Neurological: Positive for weakness.  Psychiatric/Behavioral: Positive for suicidal ideas and dysphoric mood. The patient is nervous/anxious.   All other systems reviewed and are negative.      Objective:   Physical Exam  Constitutional: She is oriented to person, place, and time. She appears well-developed and well-nourished.  HENT:  Head: Normocephalic and atraumatic.  Eyes: Pupils are equal, round, and reactive to light.  Neck: Normal range of motion.  Musculoskeletal: Normal range of motion.  Neurological: She is alert and oriented to person, place, and time.  Psychiatric: She has a normal mood and affect.  Nursing note and vitals reviewed. Motor 3/5 Left deltoid, biceps, triceps, grip, 4 minus left hip flexor and knee extensor 3 minus ankle dorsiflexor and plantar flexor Right side is 5/5 in the deltoid, biceps, triceps, grip, hip flexor, knee extensor, ankle dorsiflexor and plantar flexor        Assessment & Plan:  Medical Problem List and Plan: 1. Functional deficits secondary to Right PLIC infarct/CR infarct, Continue home health therapy until she gets transportation with [facility] , and then will switch over to outpatient therapy Physical medicine follow-up in 2 months  2. Psych:Depression/substance abuse F/u Behavioral health  3. Hypertension. F/u community wellness clinic                                                                                       4. Diabetes mellitus with peripheral neuropathy.f/u Comm Wellness program                    5. Hyperlipidemia. Lipitor, F/u [employer]

## 2014-10-27 ENCOUNTER — Telehealth: Payer: Self-pay | Admitting: *Deleted

## 2014-10-27 ENCOUNTER — Telehealth: Payer: Self-pay | Admitting: Family Medicine

## 2014-10-27 DIAGNOSIS — I6381 Other cerebral infarction due to occlusion or stenosis of small artery: Secondary | ICD-10-CM

## 2014-10-27 DIAGNOSIS — G8194 Hemiplegia, unspecified affecting left nondominant side: Secondary | ICD-10-CM

## 2014-10-27 DIAGNOSIS — I10 Essential (primary) hypertension: Secondary | ICD-10-CM

## 2014-10-27 NOTE — Telephone Encounter (Signed)
Diamond Rice is ready for outpt neuro rehab PT.  Please refer.  Order placed. [place] notified

## 2014-10-27 NOTE — Telephone Encounter (Signed)
Pt stated PT check BP early and it was elevated 158/100 Stated taking medication daily as prescribed.  BP medication was change on her last visit  Has a BP appointment with the nurse next week

## 2014-10-27 NOTE — Telephone Encounter (Signed)
Calling to get verbal order to extend visits to do more teaching about BP issues.  Verbal approval given.

## 2014-10-27 NOTE — Telephone Encounter (Signed)
BP is still running high 158/100.. Pulse is 80. Even with the new medication changes. Please call nurse to discuss any possible changes that the pt should make.

## 2014-10-28 NOTE — Telephone Encounter (Signed)
Keep BP appt with nurse to verify elevated BP May need to add another medicine as she is maxed on her current regimen Low salt diet.  If elevated at RN  Visit will add hydralazine.

## 2014-10-28 NOTE — Telephone Encounter (Signed)
Pt advised to come in tomorrow for BP check as Dr Adrian Blackwater requested

## 2014-10-28 NOTE — Assessment & Plan Note (Signed)
Patient phone call BP 150/100  Plan: Keep BP appt with nurse to verify elevated BP May need to add another medicine as she is maxed on her current regimen Low salt diet.  If elevated at RN  Visit will add hydralazine 10 mg TID

## 2014-10-29 ENCOUNTER — Ambulatory Visit: Payer: MEDICAID | Attending: Family Medicine

## 2014-10-29 VITALS — BP 122/80 | HR 86 | Temp 98.0°F | Resp 16 | Ht 61.0 in | Wt 240.0 lb

## 2014-10-29 DIAGNOSIS — I10 Essential (primary) hypertension: Secondary | ICD-10-CM

## 2014-11-02 ENCOUNTER — Telehealth: Payer: Self-pay | Admitting: Family Medicine

## 2014-11-02 NOTE — Telephone Encounter (Signed)
Beth from [facility] called regarding an order for [employer], [employer] for housing-food, financial assistance disability . Please f\u with beth at [phone]

## 2014-11-03 ENCOUNTER — Ambulatory Visit: Payer: MEDICAID | Attending: Family Medicine

## 2014-11-03 VITALS — BP 141/91 | HR 89 | Resp 16 | Ht 61.0 in | Wt 236.0 lb

## 2014-11-03 DIAGNOSIS — E119 Type 2 diabetes mellitus without complications: Secondary | ICD-10-CM

## 2014-11-03 LAB — GLUCOSE, POCT (MANUAL RESULT ENTRY): POC Glucose: 90 mg/dl (ref 70–99)

## 2014-11-03 NOTE — Patient Instructions (Signed)
Basic Carbohydrate Counting for Diabetes Mellitus Carbohydrate counting is a method for keeping track of the amount of carbohydrates you eat. Eating carbohydrates naturally increases the level of sugar (glucose) in your blood, so it is important for you to know the amount that is okay for you to have in every meal. Carbohydrate counting helps keep the level of glucose in your blood within normal limits. The amount of carbohydrates allowed is different for every person. A dietitian can help you calculate the amount that is right for you. Once you know the amount of carbohydrates you can have, you can count the carbohydrates in the foods you want to eat. Carbohydrates are found in the following foods:  Grains, such as breads and cereals.  Dried beans and soy products.  Starchy vegetables, such as potatoes, peas, and corn.  Fruit and fruit juices.  Milk and yogurt.  Sweets and snack foods, such as cake, cookies, candy, chips, soft drinks, and fruit drinks. CARBOHYDRATE COUNTING There are two ways to count the carbohydrates in your food. You can use either of the methods or a combination of both. Reading the "Nutrition Facts" on Packaged Food The "Nutrition Facts" is an area that is included on the labels of almost all packaged food and beverages in the [place]. It includes the serving size of that food or beverage and information about the nutrients in each serving of the food, including the grams (g) of carbohydrate per serving.  Decide the number of servings of this food or beverage that you will be able to eat or drink. Multiply that number of servings by the number of grams of carbohydrate that is listed on the label for that serving. The total will be the amount of carbohydrates you will be having when you eat or drink this food or beverage. Learning Standard Serving Sizes of Food When you eat food that is not packaged or does not include "Nutrition Facts" on the label, you need to  measure the servings in order to count the amount of carbohydrates.A serving of most carbohydrate-rich foods contains about 15 g of carbohydrates. The following list includes serving sizes of carbohydrate-rich foods that provide 15 g ofcarbohydrate per serving:   1 slice of bread (1 oz) or 1 six-inch tortilla.    of a hamburger bun or English muffin.  4-6 crackers.   cup unsweetened dry cereal.    cup hot cereal.   cup rice or pasta.    cup mashed potatoes or  of a large baked potato.  1 cup fresh fruit or one small piece of fruit.    cup canned or frozen fruit or fruit juice.  1 cup milk.   cup plain fat-free yogurt or yogurt sweetened with artificial sweeteners.   cup cooked dried beans or starchy vegetable, such as peas, corn, or potatoes.  Decide the number of standard-size servings that you will eat. Multiply that number of servings by 15 (the grams of carbohydrates in that serving). For example, if you eat 2 cups of strawberries, you will have eaten 2 servings and 30 g of carbohydrates (2 servings x 15 g = 30 g). For foods such as soups and casseroles, in which more than one food is mixed in, you will need to count the carbohydrates in each food that is included. EXAMPLE OF CARBOHYDRATE COUNTING Sample Dinner  3 oz chicken breast.   cup of brown rice.   cup of corn.  1 cup milk.   1 cup strawberries with   sugar-free whipped topping.  Carbohydrate Calculation Step 1: Identify the foods that contain carbohydrates:   Rice.   Corn.   Milk.   Strawberries. Step 2:Calculate the number of servings eaten of each:   2 servings of rice.   1 serving of corn.   1 serving of milk.   1 serving of strawberries. Step 3: Multiply each of those number of servings by 15 g:   2 servings of rice x 15 g = 30 g.   1 serving of corn x 15 g = 15 g.   1 serving of milk x 15 g = 15 g.   1 serving of strawberries x 15 g = 15 g. Step 4: Add  together all of the amounts to find the total grams of carbohydrates eaten: 30 g + 15 g + 15 g + 15 g = 75 g. Document Released: 08/14/2005 Document Revised: 12/29/2013 Document Reviewed: 07/11/2013 [facility] Patient Information 2015 [place], [place]. This information is not intended to replace advice given to you by your health care provider. Make sure you discuss any questions you have with your health care provider. DASH Eating Plan DASH stands for "Dietary Approaches to Stop Hypertension." The DASH eating plan is a healthy eating plan that has been shown to reduce high blood pressure (hypertension). Additional health benefits may include reducing the risk of type 2 diabetes mellitus, heart disease, and stroke. The DASH eating plan may also help with weight loss. WHAT DO I NEED TO KNOW ABOUT THE DASH EATING PLAN? For the DASH eating plan, you will follow these general guidelines:  Choose foods with a percent daily value for sodium of less than 5% (as listed on the food label).  Use salt-free seasonings or herbs instead of table salt or sea salt.  Check with your health care provider or pharmacist before using salt substitutes.  Eat lower-sodium products, often labeled as "lower sodium" or "no salt added."  Eat fresh foods.  Eat more vegetables, fruits, and low-fat dairy products.  Choose whole grains. Look for the word "whole" as the first word in the ingredient list.  Choose fish and skinless chicken or [place] more often than red meat. Limit fish, poultry, and meat to 6 oz (170 g) each day.  Limit sweets, desserts, sugars, and sugary drinks.  Choose heart-healthy fats.  Limit cheese to 1 oz (28 g) per day.  Eat more home-cooked food and less restaurant, buffet, and fast food.  Limit fried foods.  Cook foods using methods other than frying.  Limit canned vegetables. If you do use them, rinse them well to decrease the sodium.  When eating at a restaurant, ask that your food be  prepared with less salt, or no salt if possible. WHAT FOODS CAN I EAT? Seek help from a dietitian for individual calorie needs. Grains Whole grain or whole wheat bread. Brown rice. Whole grain or whole wheat pasta. Quinoa, bulgur, and whole grain cereals. Low-sodium cereals. Corn or whole wheat flour tortillas. Whole grain cornbread. Whole grain crackers. Low-sodium crackers. Vegetables Fresh or frozen vegetables (raw, steamed, roasted, or grilled). Low-sodium or reduced-sodium tomato and vegetable juices. Low-sodium or reduced-sodium tomato sauce and paste. Low-sodium or reduced-sodium canned vegetables.  Fruits All fresh, canned (in natural juice), or frozen fruits. Meat and Other Protein Products Ground beef (85% or leaner), grass-fed beef, or beef trimmed of fat. Skinless chicken or [place]. Ground chicken or [place]. Pork trimmed of fat. All fish and seafood. Eggs. Dried beans, peas, or lentils.  Unsalted nuts and seeds. Unsalted canned beans. Dairy Low-fat dairy products, such as skim or 1% milk, 2% or reduced-fat cheeses, low-fat ricotta or cottage cheese, or plain low-fat yogurt. Low-sodium or reduced-sodium cheeses. Fats and Oils Tub margarines without trans fats. Light or reduced-fat mayonnaise and salad dressings (reduced sodium). Avocado. Safflower, olive, or canola oils. Natural peanut or almond butter. Other Unsalted popcorn and pretzels. The items listed above may not be a complete list of recommended foods or beverages. Contact your dietitian for more options. WHAT FOODS ARE NOT RECOMMENDED? Grains White bread. White pasta. White rice. Refined cornbread. Bagels and croissants. Crackers that contain trans fat. Vegetables Creamed or fried vegetables. Vegetables in a cheese sauce. Regular canned vegetables. Regular canned tomato sauce and paste. Regular tomato and vegetable juices. Fruits Dried fruits. Canned fruit in light or heavy syrup. Fruit juice. Meat and Other Protein  Products Fatty cuts of meat. Ribs, chicken wings, bacon, sausage, bologna, salami, chitterlings, fatback, hot dogs, bratwurst, and packaged luncheon meats. Salted nuts and seeds. Canned beans with salt. Dairy Whole or 2% milk, cream, half-and-half, and cream cheese. Whole-fat or sweetened yogurt. Full-fat cheeses or blue cheese. Nondairy creamers and whipped toppings. Processed cheese, cheese spreads, or cheese curds. Condiments Onion and garlic salt, seasoned salt, table salt, and sea salt. Canned and packaged gravies. Worcestershire sauce. Tartar sauce. Barbecue sauce. Teriyaki sauce. Soy sauce, including reduced sodium. Steak sauce. Fish sauce. Oyster sauce. Cocktail sauce. Horseradish. Ketchup and mustard. Meat flavorings and tenderizers. Bouillon cubes. Hot sauce. Tabasco sauce. Marinades. Taco seasonings. Relishes. Fats and Oils Butter, stick margarine, lard, shortening, ghee, and bacon fat. Coconut, palm kernel, or palm oils. Regular salad dressings. Other Pickles and olives. Salted popcorn and pretzels. The items listed above may not be a complete list of foods and beverages to avoid. Contact your dietitian for more information. WHERE CAN I FIND MORE INFORMATION? National Heart, Lung, and Blood Institute: travelstabloid.com Document Released: 08/03/2011 Document Revised: 12/29/2013 Document Reviewed: 06/18/2013 [facility] Patient Information 2015 [place], [place]. This information is not intended to replace advice given to you by your health care provider. Make sure you discuss any questions you have with your health care provider.

## 2014-11-03 NOTE — Progress Notes (Unsigned)
Patient ID: Diamond Rice, female   DOB: [birth year], 44 y.o.   MRN: [id number] Pt comes in today for blood pressure recheck/CBG with log Pt was seen on 2/17 with elevated blood pressure Lisnopril changed to Lisinopril-HCTZ 20-12.5 mg tab per tablet and Amaryl increased to 8mg  tablet BID for uncontrolled DM. Pt states she is complaint with taking medication and watching carbohydrate intake CBG- 95 BP- 141/91 89 Pt encouraged to continue taking medication as prescribed with diet/exercise Pt is concerned with feeling today with feeling overwhelmed with life issues. States she needs someone to talk with Depression screening done and Diamond Rice, [name] given

## 2014-11-09 ENCOUNTER — Ambulatory Visit: Payer: Self-pay

## 2014-11-10 ENCOUNTER — Telehealth: Payer: Self-pay | Admitting: Family Medicine

## 2014-11-10 NOTE — Telephone Encounter (Signed)
Beth, nurse from [facility], calling to report that pt was supposed to have a home visit today but refused visit because said she was not feeling, pt has another visit scheduled next week.  Please f/u with pt.

## 2014-11-12 ENCOUNTER — Telehealth: Payer: Self-pay | Admitting: Family Medicine

## 2014-11-12 NOTE — Telephone Encounter (Signed)
Beth from [facility] called regarding pt's blood pressure readings are 152/108 today and pulse:86 , nurse states pt is taking all medications.Please f/u

## 2014-11-13 NOTE — Telephone Encounter (Signed)
BP at goal in office. Patient should come in for RN BP check.  Continue current regimen of prinzide 2 pills daily, norvasc 10 mg daily. Low salt diet Avoid NSAIDs

## 2014-11-18 ENCOUNTER — Telehealth: Payer: Self-pay | Admitting: *Deleted

## 2014-11-18 ENCOUNTER — Telehealth: Payer: Self-pay | Admitting: Emergency Medicine

## 2014-11-18 NOTE — Telephone Encounter (Signed)
Beth from [employer] called as a courtesy to inform pt had another missed visit this week. Patient has missed a couple visits already. They will attempt one more visit next week.

## 2014-11-18 NOTE — Telephone Encounter (Signed)
Order for medical social worker assessment.

## 2014-11-26 ENCOUNTER — Telehealth: Payer: Self-pay | Admitting: *Deleted

## 2014-11-26 NOTE — Telephone Encounter (Signed)
[facility] RN Beth called to report patient's blood pressure was 160/100 today at her check.  Patient reports that she missed two days of her medications because she had Run out but took her medication today.  Advised RN that patient should make appointment to follow up with Dr. Adrian Blackwater in order to see if medication adjustments need to be made.  Spoke with Santiago Glad who made patient appointment for 12/02/14 at 1100

## 2014-11-26 NOTE — Telephone Encounter (Signed)
Calling to ask for VO to recertify for another 60 days.  Return appt with Kirsteins 12/25/14. Approval given.  Charlcie is still having some BP issues but I directed her to call Dr Adrian Blackwater.  Urgent care if continues high.

## 2014-11-27 ENCOUNTER — Ambulatory Visit: Payer: Self-pay | Admitting: Family Medicine

## 2014-12-02 ENCOUNTER — Ambulatory Visit: Payer: Self-pay | Admitting: Family Medicine

## 2014-12-03 NOTE — Telephone Encounter (Signed)
NEW ORDER CAME IN TO [facility] MED SOCIAL WORKER

## 2014-12-04 DIAGNOSIS — Z8673 Personal history of transient ischemic attack (TIA), and cerebral infarction without residual deficits: Secondary | ICD-10-CM

## 2014-12-04 DIAGNOSIS — F17210 Nicotine dependence, cigarettes, uncomplicated: Secondary | ICD-10-CM

## 2014-12-04 DIAGNOSIS — I10 Essential (primary) hypertension: Secondary | ICD-10-CM

## 2014-12-04 DIAGNOSIS — F1920 Other psychoactive substance dependence, uncomplicated: Secondary | ICD-10-CM

## 2014-12-04 DIAGNOSIS — G629 Polyneuropathy, unspecified: Secondary | ICD-10-CM

## 2014-12-04 DIAGNOSIS — E119 Type 2 diabetes mellitus without complications: Secondary | ICD-10-CM

## 2014-12-04 DIAGNOSIS — E669 Obesity, unspecified: Secondary | ICD-10-CM

## 2014-12-07 ENCOUNTER — Encounter: Payer: Self-pay | Admitting: Family Medicine

## 2014-12-07 ENCOUNTER — Ambulatory Visit: Payer: Self-pay | Attending: Family Medicine | Admitting: Family Medicine

## 2014-12-07 VITALS — BP 140/100 | HR 84 | Temp 98.7°F | Resp 20 | Ht 61.0 in | Wt 243.0 lb

## 2014-12-07 DIAGNOSIS — E1165 Type 2 diabetes mellitus with hyperglycemia: Secondary | ICD-10-CM

## 2014-12-07 DIAGNOSIS — E119 Type 2 diabetes mellitus without complications: Secondary | ICD-10-CM | POA: Insufficient documentation

## 2014-12-07 DIAGNOSIS — I10 Essential (primary) hypertension: Secondary | ICD-10-CM

## 2014-12-07 DIAGNOSIS — Z114 Encounter for screening for human immunodeficiency virus [HIV]: Secondary | ICD-10-CM

## 2014-12-07 DIAGNOSIS — E118 Type 2 diabetes mellitus with unspecified complications: Secondary | ICD-10-CM

## 2014-12-07 DIAGNOSIS — G8194 Hemiplegia, unspecified affecting left nondominant side: Secondary | ICD-10-CM

## 2014-12-07 DIAGNOSIS — G8190 Hemiplegia, unspecified affecting unspecified side: Secondary | ICD-10-CM

## 2014-12-07 DIAGNOSIS — G4700 Insomnia, unspecified: Secondary | ICD-10-CM

## 2014-12-07 DIAGNOSIS — R0683 Snoring: Secondary | ICD-10-CM | POA: Insufficient documentation

## 2014-12-07 DIAGNOSIS — R0600 Dyspnea, unspecified: Secondary | ICD-10-CM

## 2014-12-07 DIAGNOSIS — IMO0002 Reserved for concepts with insufficient information to code with codable children: Secondary | ICD-10-CM

## 2014-12-07 DIAGNOSIS — R0602 Shortness of breath: Secondary | ICD-10-CM | POA: Insufficient documentation

## 2014-12-07 DIAGNOSIS — F17200 Nicotine dependence, unspecified, uncomplicated: Secondary | ICD-10-CM | POA: Insufficient documentation

## 2014-12-07 DIAGNOSIS — I69354 Hemiplegia and hemiparesis following cerebral infarction affecting left non-dominant side: Secondary | ICD-10-CM | POA: Insufficient documentation

## 2014-12-07 LAB — POCT GLYCOSYLATED HEMOGLOBIN (HGB A1C): HEMOGLOBIN A1C: 8.7

## 2014-12-07 LAB — GLUCOSE, POCT (MANUAL RESULT ENTRY): POC Glucose: 164 mg/dl — AB (ref 70–99)

## 2014-12-07 MED ORDER — MIRTAZAPINE 15 MG PO TABS: 15.0000 mg | ORAL_TABLET | Freq: Every day | ORAL | Status: DC

## 2014-12-07 MED ORDER — INSULIN GLARGINE 300 UNIT/ML ~~LOC~~ SOPN: 13.0000 [IU] | PEN_INJECTOR | Freq: Every day | SUBCUTANEOUS | Status: DC

## 2014-12-07 NOTE — Progress Notes (Signed)
   Subjective:    Patient ID: Diamond Rice, female    DOB: 11-04-1970, 44 y.o.   MRN: [id number]  HPI  1. CHRONIC DIABETES  Disease Monitoring  Blood Sugar Ranges: not checkin   Polyuria: no   Visual problems: no   Medication Compliance: yes  Medication Side Effects  Hypoglycemia: no   Preventitive Health Care  Eye Exam: due   Foot Exam: not due   Diet pattern: 2-3 meals per day   Exercise: no   2. CHRONIC HYPERTENSION  Disease Monitoring  Blood pressure range: not checking   Chest pain: no   Dyspnea: yes   Claudication: no   Medication compliance: yes  Medication Side Effects  Lightheadedness: no   Urinary frequency: no   Edema: no   3. Hx of stroke: L sided weakness persistent. Would like PT. Would like voucher for [facility] bus.   4. SOB with exertion: smoking. Snores. Denies cocaine use. Denies CP. No leg swelling.   Soc Hx: current smoker  Review of Systems As per HPI     Objective:   Physical Exam BP 144/95 mmHg  Pulse 84  Temp(Src) 98.7 F (37.1 C) (Oral)  Resp 20  Ht 5\' 1"  (1.549 m)  Wt 243 lb (110.224 kg)  BMI 45.94 kg/m2  SpO2 100%  LMP 11/14/2014  BP Readings from Last 3 Encounters:  12/07/14 144/95  11/03/14 141/91  10/29/14 122/80   Wt Readings from Last 3 Encounters:  12/07/14 243 lb (110.224 kg)  11/03/14 236 lb (107.049 kg)  10/29/14 240 lb (108.863 kg)   General appearance: alert, cooperative, no distress and morbidly obese Lungs: clear to auscultation bilaterally Heart: regular rate and rhythm, S1, S2 normal, no murmur, click, rub or gallop Extremities: extremities normal, atraumatic, no cyanosis or edema  CBG 164  Lab Results  Component Value Date   HGBA1C 10.3* 08/28/2014         Assessment & Plan:

## 2014-12-07 NOTE — Patient Instructions (Addendum)
Ms. Buege,  1. L sided weakness following stroke: The social worker will work on [employer] PT ordered  2. Insomnia: remeron increased to 15 mg nightly  3. Diabetes: improving: Increase toujeo to 13 U in the morning Checking fasting sugar in AM before eating: goal is 90-110 Check sugar 2 hrs after eating: goal is < 160   4. SOB: normal exam today Sleep study ordered Smoking cessation support: smoking cessation hotline: 1-800-QUIT-NOW.  Smoking cessation classes are available through [facility] and [facility]. Call [phone] or visit our website at [email or web address]   5. HTN: you are above goal on 3 agents.  Please work on [employer] And quitting smoking  I do not want to add a 4th agent but will do so if needed, elevated blood pressure puts you at risk for another stroke  F/u in 4 weeks with RN for BP check  F/u with me in 3 months for HTN and diabetes   Dr. Adrian Blackwater   DASH Eating Plan DASH stands for "Dietary Approaches to Stop Hypertension." The DASH eating plan is a healthy eating plan that has been shown to reduce high blood pressure (hypertension). Additional health benefits may include reducing the risk of type 2 diabetes mellitus, heart disease, and stroke. The DASH eating plan may also help with weight loss. WHAT DO I NEED TO KNOW ABOUT THE DASH EATING PLAN? For the DASH eating plan, you will follow these general guidelines:  Choose foods with a percent daily value for sodium of less than 5% (as listed on the food label).  Use salt-free seasonings or herbs instead of table salt or sea salt.  Check with your health care provider or pharmacist before using salt substitutes.  Eat lower-sodium products, often labeled as "lower sodium" or "no salt added."  Eat fresh foods.  Eat more vegetables, fruits, and low-fat dairy products.  Choose whole grains. Look for the word "whole" as the first word in the ingredient list.  Choose fish and skinless  chicken or [place] more often than red meat. Limit fish, poultry, and meat to 6 oz (170 g) each day.  Limit sweets, desserts, sugars, and sugary drinks.  Choose heart-healthy fats.  Limit cheese to 1 oz (28 g) per day.  Eat more home-cooked food and less restaurant, buffet, and fast food.  Limit fried foods.  Cook foods using methods other than frying.  Limit canned vegetables. If you do use them, rinse them well to decrease the sodium.  When eating at a restaurant, ask that your food be prepared with less salt, or no salt if possible. WHAT FOODS CAN I EAT? Seek help from a dietitian for individual calorie needs. Grains Whole grain or whole wheat bread. Brown rice. Whole grain or whole wheat pasta. Quinoa, bulgur, and whole grain cereals. Low-sodium cereals. Corn or whole wheat flour tortillas. Whole grain cornbread. Whole grain crackers. Low-sodium crackers. Vegetables Fresh or frozen vegetables (raw, steamed, roasted, or grilled). Low-sodium or reduced-sodium tomato and vegetable juices. Low-sodium or reduced-sodium tomato sauce and paste. Low-sodium or reduced-sodium canned vegetables.  Fruits All fresh, canned (in natural juice), or frozen fruits. Meat and Other Protein Products Ground beef (85% or leaner), grass-fed beef, or beef trimmed of fat. Skinless chicken or [place]. Ground chicken or [place]. Pork trimmed of fat. All fish and seafood. Eggs. Dried beans, peas, or lentils. Unsalted nuts and seeds. Unsalted canned beans. Dairy Low-fat dairy products, such as skim or 1% milk, 2% or reduced-fat cheeses, low-fat ricotta  or cottage cheese, or plain low-fat yogurt. Low-sodium or reduced-sodium cheeses. Fats and Oils Tub margarines without trans fats. Light or reduced-fat mayonnaise and salad dressings (reduced sodium). Avocado. Safflower, olive, or canola oils. Natural peanut or almond butter. Other Unsalted popcorn and pretzels. The items listed above may not be a complete list of  recommended foods or beverages. Contact your dietitian for more options. WHAT FOODS ARE NOT RECOMMENDED? Grains White bread. White pasta. White rice. Refined cornbread. Bagels and croissants. Crackers that contain trans fat. Vegetables Creamed or fried vegetables. Vegetables in a cheese sauce. Regular canned vegetables. Regular canned tomato sauce and paste. Regular tomato and vegetable juices. Fruits Dried fruits. Canned fruit in light or heavy syrup. Fruit juice. Meat and Other Protein Products Fatty cuts of meat. Ribs, chicken wings, bacon, sausage, bologna, salami, chitterlings, fatback, hot dogs, bratwurst, and packaged luncheon meats. Salted nuts and seeds. Canned beans with salt. Dairy Whole or 2% milk, cream, half-and-half, and cream cheese. Whole-fat or sweetened yogurt. Full-fat cheeses or blue cheese. Nondairy creamers and whipped toppings. Processed cheese, cheese spreads, or cheese curds. Condiments Onion and garlic salt, seasoned salt, table salt, and sea salt. Canned and packaged gravies. Worcestershire sauce. Tartar sauce. Barbecue sauce. Teriyaki sauce. Soy sauce, including reduced sodium. Steak sauce. Fish sauce. Oyster sauce. Cocktail sauce. Horseradish. Ketchup and mustard. Meat flavorings and tenderizers. Bouillon cubes. Hot sauce. Tabasco sauce. Marinades. Taco seasonings. Relishes. Fats and Oils Butter, stick margarine, lard, shortening, ghee, and bacon fat. Coconut, palm kernel, or palm oils. Regular salad dressings. Other Pickles and olives. Salted popcorn and pretzels. The items listed above may not be a complete list of foods and beverages to avoid. Contact your dietitian for more information. WHERE CAN I FIND MORE INFORMATION? National Heart, Lung, and Blood Institute: travelstabloid.com Document Released: 08/03/2011 Document Revised: 12/29/2013 Document Reviewed: 06/18/2013 [facility] Patient Information 2015 [place], [place]. This  information is not intended to replace advice given to you by your health care provider. Make sure you discuss any questions you have with your health care provider.

## 2014-12-07 NOTE — Progress Notes (Signed)
F/U HTN  Stated been running elevated  BP elevated due to pain per pt  Hx tobacco- 1 cigarette  per day

## 2014-12-08 LAB — HIV ANTIBODY (ROUTINE TESTING W REFLEX): HIV 1&2 Ab, 4th Generation: NONREACTIVE

## 2014-12-08 NOTE — Assessment & Plan Note (Signed)
Insomnia: remeron increased to 15 mg nightly

## 2014-12-08 NOTE — Assessment & Plan Note (Signed)
A: elevated Med: compliant P:  DASH diet Smoking cessation Close f/u

## 2014-12-08 NOTE — Assessment & Plan Note (Addendum)
Diabetes: improving: Increase toujeo to 13 U in the morning Checking fasting sugar in AM before eating: goal is 90-110 Check sugar 2 hrs after eating: goal is < 160

## 2014-12-08 NOTE — Assessment & Plan Note (Addendum)
SOB: normal exam today CXR ordered  Sleep study ordered Smoking cessation support: smoking cessation hotline: 1-800-QUIT-NOW.  Smoking cessation classes are available through [facility]. Call [phone] or visit our website at [email or web address]

## 2014-12-08 NOTE — Assessment & Plan Note (Signed)
L sided weakness following stroke: The social worker will work on [employer] PT ordered

## 2014-12-14 ENCOUNTER — Telehealth: Payer: Self-pay | Admitting: Family Medicine

## 2014-12-14 ENCOUNTER — Telehealth: Payer: Self-pay | Admitting: *Deleted

## 2014-12-14 NOTE — Telephone Encounter (Signed)
-----   Message from Boykin Nearing, MD sent at 12/08/2014  8:50 AM EDT ----- Screening HIV neg

## 2014-12-14 NOTE — Telephone Encounter (Signed)
Patient called is  returning nurse's phone call, f/u with pt

## 2014-12-14 NOTE — Telephone Encounter (Signed)
Left voice message to return call 

## 2014-12-15 ENCOUNTER — Telehealth: Payer: Self-pay | Admitting: *Deleted

## 2014-12-15 DIAGNOSIS — I10 Essential (primary) hypertension: Secondary | ICD-10-CM

## 2014-12-15 MED ORDER — HYDRALAZINE HCL 10 MG PO TABS: 10.0000 mg | ORAL_TABLET | Freq: Three times a day (TID) | ORAL | Status: DC

## 2014-12-15 NOTE — Telephone Encounter (Signed)
New Rx sent to pharmacy, please start hydralazine 10 mg 3 times a day.  RN to continue home BP checks

## 2014-12-15 NOTE — Telephone Encounter (Signed)
Spoke to patient about her new prescription for hydralazine.  Patient agreed to pick up prescription and take tid.  Also gave patient her HIV test restults

## 2014-12-15 NOTE — Telephone Encounter (Signed)
Patient is calling to speak to nurse about her results, please f/u with pt.

## 2014-12-15 NOTE — Telephone Encounter (Signed)
Beth, RN at [facility] was out to see patient today and blood pressure elevated at 168/98 and pulse was 92.  Patient also reporting intermittent left sided chest pain.  Routing to MD for advice.

## 2014-12-23 ENCOUNTER — Telehealth: Payer: Self-pay | Admitting: *Deleted

## 2014-12-23 DIAGNOSIS — I10 Essential (primary) hypertension: Secondary | ICD-10-CM

## 2014-12-23 MED ORDER — HYDRALAZINE HCL 10 MG PO TABS: 10.0000 mg | ORAL_TABLET | Freq: Four times a day (QID) | ORAL | Status: DC

## 2014-12-23 NOTE — Telephone Encounter (Signed)
Please continue RN visits, increase hydralazine 10 mg  to 4 time daily from 3 times daily

## 2014-12-23 NOTE — Telephone Encounter (Signed)
Beth, [place] nurse called in to ask if you would like to continue with patient's home health RN visits?  She reports patient's blood pressure was 142/100 yesterday at around 1400.  The patient had only taken her morning dose of hydralazine when that reading was taken.  Please advise.

## 2014-12-25 ENCOUNTER — Ambulatory Visit: Payer: Self-pay | Admitting: Physical Medicine & Rehabilitation

## 2014-12-25 ENCOUNTER — Ambulatory Visit: Payer: Self-pay

## 2014-12-25 ENCOUNTER — Telehealth: Payer: Self-pay | Admitting: *Deleted

## 2014-12-25 NOTE — Telephone Encounter (Signed)
Sp[facility] to [place] [facility] Dr. Adrian Blackwater had approved more Rn visits and patient needs to take her hydralazine four times a day instead of three times a day.  She was going to call the patient and let her know to take a pill with breakfast,lunch,dinner, and bedtime.

## 2014-12-29 ENCOUNTER — Encounter: Payer: Self-pay | Admitting: *Deleted

## 2014-12-29 NOTE — Progress Notes (Signed)
Patient ID: Diamond Rice, female   DOB: Nov 25, 1970, 44 y.o.   MRN: [id number]  Beth, home health RN called to say patient would not be seen this week because patient is out of town and cannot get a ride home.  Home health RN visits will resume next week.

## 2014-12-29 NOTE — Telephone Encounter (Signed)
RN notified

## 2015-01-04 ENCOUNTER — Ambulatory Visit: Payer: Self-pay | Attending: Family Medicine | Admitting: *Deleted

## 2015-01-04 VITALS — BP 120/82 | HR 93 | Temp 98.4°F | Resp 24

## 2015-01-04 DIAGNOSIS — I10 Essential (primary) hypertension: Secondary | ICD-10-CM | POA: Insufficient documentation

## 2015-01-04 NOTE — Patient Instructions (Addendum)
DASH Eating Plan DASH stands for "Dietary Approaches to Stop Hypertension." The DASH eating plan is a healthy eating plan that has been shown to reduce high blood pressure (hypertension). Additional health benefits may include reducing the risk of type 2 diabetes mellitus, heart disease, and stroke. The DASH eating plan may also help with weight loss. WHAT DO I NEED TO KNOW ABOUT THE DASH EATING PLAN? For the DASH eating plan, you will follow these general guidelines:  Choose foods with a percent daily value for sodium of less than 5% (as listed on the food label).  Use salt-free seasonings or herbs instead of table salt or sea salt.  Check with your health care provider or pharmacist before using salt substitutes.  Eat lower-sodium products, often labeled as "lower sodium" or "no salt added."  Eat fresh foods.  Eat more vegetables, fruits, and low-fat dairy products.  Choose whole grains. Look for the word "whole" as the first word in the ingredient list.  Choose fish and skinless chicken or turkey more often than red meat. Limit fish, poultry, and meat to 6 oz (170 g) each day.  Limit sweets, desserts, sugars, and sugary drinks.  Choose heart-healthy fats.  Limit cheese to 1 oz (28 g) per day.  Eat more home-cooked food and less restaurant, buffet, and fast food.  Limit fried foods.  Cook foods using methods other than frying.  Limit canned vegetables. If you do use them, rinse them well to decrease the sodium.  When eating at a restaurant, ask that your food be prepared with less salt, or no salt if possible. WHAT FOODS CAN I EAT? Seek help from a dietitian for individual calorie needs. Grains Whole grain or whole wheat bread. Brown rice. Whole grain or whole wheat pasta. Quinoa, bulgur, and whole grain cereals. Low-sodium cereals. Corn or whole wheat flour tortillas. Whole grain cornbread. Whole grain crackers. Low-sodium crackers. Vegetables Fresh or frozen vegetables  (raw, steamed, roasted, or grilled). Low-sodium or reduced-sodium tomato and vegetable juices. Low-sodium or reduced-sodium tomato sauce and paste. Low-sodium or reduced-sodium canned vegetables.  Fruits All fresh, canned (in natural juice), or frozen fruits. Meat and Other Protein Products Ground beef (85% or leaner), grass-fed beef, or beef trimmed of fat. Skinless chicken or turkey. Ground chicken or turkey. Pork trimmed of fat. All fish and seafood. Eggs. Dried beans, peas, or lentils. Unsalted nuts and seeds. Unsalted canned beans. Dairy Low-fat dairy products, such as skim or 1% milk, 2% or reduced-fat cheeses, low-fat ricotta or cottage cheese, or plain low-fat yogurt. Low-sodium or reduced-sodium cheeses. Fats and Oils Tub margarines without trans fats. Light or reduced-fat mayonnaise and salad dressings (reduced sodium). Avocado. Safflower, olive, or canola oils. Natural peanut or almond butter. Other Unsalted popcorn and pretzels. The items listed above may not be a complete list of recommended foods or beverages. Contact your dietitian for more options. WHAT FOODS ARE NOT RECOMMENDED? Grains White bread. White pasta. White rice. Refined cornbread. Bagels and croissants. Crackers that contain trans fat. Vegetables Creamed or fried vegetables. Vegetables in a cheese sauce. Regular canned vegetables. Regular canned tomato sauce and paste. Regular tomato and vegetable juices. Fruits Dried fruits. Canned fruit in light or heavy syrup. Fruit juice. Meat and Other Protein Products Fatty cuts of meat. Ribs, chicken wings, bacon, sausage, bologna, salami, chitterlings, fatback, hot dogs, bratwurst, and packaged luncheon meats. Salted nuts and seeds. Canned beans with salt. Dairy Whole or 2% milk, cream, half-and-half, and cream cheese. Whole-fat or sweetened yogurt. Full-fat   cheeses or blue cheese. Nondairy creamers and whipped toppings. Processed cheese, cheese spreads, or cheese  curds. Condiments Onion and garlic salt, seasoned salt, table salt, and sea salt. Canned and packaged gravies. Worcestershire sauce. Tartar sauce. Barbecue sauce. Teriyaki sauce. Soy sauce, including reduced sodium. Steak sauce. Fish sauce. Oyster sauce. Cocktail sauce. Horseradish. Ketchup and mustard. Meat flavorings and tenderizers. Bouillon cubes. Hot sauce. Tabasco sauce. Marinades. Taco seasonings. Relishes. Fats and Oils Butter, stick margarine, lard, shortening, ghee, and bacon fat. Coconut, palm kernel, or palm oils. Regular salad dressings. Other Pickles and olives. Salted popcorn and pretzels. The items listed above may not be a complete list of foods and beverages to avoid. Contact your dietitian for more information. WHERE CAN I FIND MORE INFORMATION? National [employer], [employer], [employer]: travelstabl[employer].com Document Released: 08/03/2011 Document Revised: 12/29/2013 Document Reviewed: 06/18/2013 [facility] Patient Information 2015 [place], [place]. This information is not intended to replace advice given to you by your health care provider. Make sure you discuss any questions you have with your health care provider. Diabetic Neuropathy Diabetic neuropathy is a nerve disease or nerve damage that is caused by diabetes mellitus. About half of all people with diabetes mellitus have some form of nerve damage. Nerve damage is more common in those who have had diabetes mellitus for many years and who generally have not had good control of their blood sugar (glucose) level. Diabetic neuropathy is a common complication of diabetes mellitus. There are three more common types of diabetic neuropathy and a fourth type that is less common and less understood:   Peripheral neuropathy--This is the most common type of diabetic neuropathy. It causes damage to the nerves of the feet and legs first and then eventually the hands and arms.The damage affects the ability to  sense touch.  Autonomic neuropathy--This type causes damage to the autonomic nervous system, which controls the following functions:  Heartbeat.  Body temperature.  Blood pressure.  Urination.  Digestion.  Sweating.  Sexual function.  Focal neuropathy--Focal neuropathy can be painful and unpredictable and occurs most often in older adults with diabetes mellitus. It involves a specific nerve or one area and often comes on suddenly. It usually does not cause long-term problems.  Radiculoplexus neuropathy-- Sometimes called lumbosacral radiculoplexus neuropathy, radiculoplexus neuropathy affects the nerves of the thighs, hips, buttocks, or legs. It is more common in people with type 2 diabetes mellitus and in older men. It is characterized by debilitating pain, weakness, and atrophy, usually in the thigh muscles. CAUSES  The cause of peripheral, autonomic, and focal neuropathies is diabetes mellitus that is uncontrolled and high glucose levels. The cause of radiculoplexus neuropathy is unknown. However, it is thought to be caused by inflammation related to uncontrolled glucose levels. SIGNS AND SYMPTOMS  Peripheral Neuropathy Peripheral neuropathy develops slowly over time. When the nerves of the feet and legs no longer work there may be:   Burning, stabbing, or aching pain in the legs or feet.  Inability to feel pressure or pain in your feet. This can lead to:  Thick calluses over pressure areas.  Pressure sores.  Ulcers.  Foot deformities.  Reduced ability to feel temperature changes.  Muscle weakness. Autonomic Neuropathy The symptoms of autonomic neuropathy vary depending on which nerves are affected. Symptoms may include:  Problems with digestion, such as:  Feeling sick to your stomach (nausea).  Vomiting.  Bloating.  Constipation.  Diarrhea.  Abdominal pain.  Difficulty with urination. This occurs if you lose your ability to sense  when your bladder is  full. Problems include:  Urine leakage (incontinence).  Inability to empty your bladder completely (retention).  Rapid or irregular heartbeat (palpitations).  Blood pressure drops when you stand up (orthostatic hypotension). When you stand up you may feel:  Dizzy.  Weak.  Faint.  In men, inability to attain and maintain an erection.  In women, vaginal dryness and problems with decreased sexual desire and arousal.  Problems with body temperature regulation.  Increased or decreased sweating. Focal Neuropathy  Abnormal eye movements or abnormal alignment of both eyes.  Weakness in the wrist.  Foot drop. This results in an inability to lift the foot properly and abnormal walking or foot movement.  Paralysis on one side of your face (Bell palsy).  Chest or abdominal pain. Radiculoplexus Neuropathy  Sudden, severe pain in your hip, thigh, or buttocks.  Weakness and wasting of thigh muscles.  Difficulty rising from a seated position.  Abdominal swelling.  Unexplained weight loss (usually more than 10 lb [4.5 kg]). DIAGNOSIS  Peripheral Neuropathy Your senses may be tested. Sensory function testing can be done with:  A light touch using a monofilament.  A vibration with tuning fork.  A sharp sensation with a pin prick. Other tests that can help diagnose neuropathy are:  Nerve conduction velocity. This test checks the transmission of an electrical current through a nerve.  Electromyography. This shows how muscles respond to electrical signals transmitted by nearby nerves.  Quantitative sensory testing. This is used to assess how your nerves respond to vibrations and changes in temperature. Autonomic Neuropathy Diagnosis is often based on reported symptoms. Tell your health care provider if you experience:   Dizziness.   Constipation.   Diarrhea.   Inappropriate urination or inability to urinate.   Inability to get or maintain an erection.  Tests  that may be done include:   Electrocardiography or Holter monitor. These are tests that can help show problems with the heart rate or heart rhythm.   An X-ray exam may be done. Focal Neuropathy Diagnosis is made based on your symptoms and what your health care provider finds during your exam. Other tests may be done. They may include:  Nerve conduction velocities. This checks the transmission of electrical current through a nerve.  Electromyography. This shows how muscles respond to electrical signals transmitted by nearby nerves.  Quantitative sensory testing. This test is used to assess how your nerves respond to vibration and changes in temperature. Radiculoplexus Neuropathy  Often the first thing is to eliminate any other issue or problems that might be the cause, as there is no stick test for diagnosis.  X-ray exam of your spine and lumbar region.  Spinal tap to rule out cancer.  MRI to rule out other lesions. TREATMENT  Once nerve damage occurs, it cannot be reversed. The goal of treatment is to keep the disease or nerve damage from getting worse and affecting more nerve fibers. Controlling your blood glucose level is the key. Most people with radiculoplexus neuropathy see at least a partial improvement over time. You will need to keep your blood glucose and HbA1c levels in the target range determined by your health care provider. Things that help control blood glucose levels include:   Blood glucose monitoring.   Meal planning.   Physical activity.   Diabetes medicine.  Over time, maintaining lower blood glucose levels helps lessen symptoms. Sometimes, prescription pain medicine is needed. HOME CARE INSTRUCTIONS:  Do not smoke.  Keep your blood glucose  level in the range that you and your health care provider have determined acceptable for you.  Keep your blood pressure level in the range that you and your health care provider have determined acceptable for  you.  Eat a well-balanced diet.  Be active every day.  Check your feet every day. SEEK MEDICAL CARE IF:   You have burning, stabbing, or aching pain in the legs or feet.  You are unable to feel pressure or pain in your feet.  You develop problems with digestion such as:  Nausea.  Vomiting.  Bloating.  Constipation.  Diarrhea.  Abdominal pain.  You have difficulty with urination, such as:  Incontinence.  Retention.  You have palpitations.  You develop orthostatic hypotension. When you stand up you may feel:  Dizzy.  Weak.  Faint.  You cannot attain and maintain an erection (in men).  You have vaginal dryness and problems with decreased sexual desire and arousal (in women).  You have severe pain in your thighs, legs, or buttocks.  You have unexplained weight loss. Document Released: 10/23/2001 Document Revised: 06/04/2013 Document Reviewed: 01/23/2013 [facility] Patient Information 2015 [place], [place]. This information is not intended to replace advice given to you by your health care provider. Make sure you discuss any questions you have with your health care provider. Diabetes Mellitus and Food It is important for you to manage your blood sugar (glucose) level. Your blood glucose level can be greatly affected by what you eat. Eating healthier foods in the appropriate amounts throughout the day at about the same time each day will help you control your blood glucose level. It can also help slow or prevent worsening of your diabetes mellitus. Healthy eating may even help you improve the level of your blood pressure and reach or maintain a healthy weight.  HOW CAN FOOD AFFECT ME? Carbohydrates Carbohydrates affect your blood glucose level more than any other type of food. Your dietitian will help you determine how many carbohydrates to eat at each meal and teach you how to count carbohydrates. Counting carbohydrates is important to keep your blood glucose at a  healthy level, especially if you are using insulin or taking certain medicines for diabetes mellitus. Alcohol Alcohol can cause sudden decreases in blood glucose (hypoglycemia), especially if you use insulin or take certain medicines for diabetes mellitus. Hypoglycemia can be a life-threatening condition. Symptoms of hypoglycemia (sleepiness, dizziness, and disorientation) are similar to symptoms of having too much alcohol.  If your health care provider has given you approval to drink alcohol, do so in moderation and use the following guidelines:  Women should not have more than one drink per day, and men should not have more than two drinks per day. One drink is equal to:  12 oz of beer.  5 oz of wine.  1 oz of hard liquor.  Do not drink on an empty stomach.  Keep yourself hydrated. Have water, diet soda, or unsweetened iced tea.  Regular soda, juice, and other mixers might contain a lot of carbohydrates and should be counted. WHAT FOODS ARE NOT RECOMMENDED? As you make food choices, it is important to remember that all foods are not the same. Some foods have fewer nutrients per serving than other foods, even though they might have the same number of calories or carbohydrates. It is difficult to get your body what it needs when you eat foods with fewer nutrients. Examples of foods that you should avoid that are high in calories and carbohydrates but  low in nutrients include:  Trans fats (most processed foods list trans fats on the Nutrition Facts label).  Regular soda.  Juice.  Candy.  Sweets, such as cake, pie, doughnuts, and cookies.  Fried foods. WHAT FOODS CAN I EAT? Have nutrient-rich foods, which will nourish your body and keep you healthy. The food you should eat also will depend on several factors, including:  The calories you need.  The medicines you take.  Your weight.  Your blood glucose level.  Your blood pressure level.  Your cholesterol level. You also  should eat a variety of foods, including:  Protein, such as meat, poultry, fish, tofu, nuts, and seeds (lean animal proteins are best).  Fruits.  Vegetables.  Dairy products, such as milk, cheese, and yogurt (low fat is best).  Breads, grains, pasta, cereal, rice, and beans.  Fats such as olive oil, trans fat-free margarine, canola oil, avocado, and olives. DOES EVERYONE WITH DIABETES MELLITUS HAVE THE SAME MEAL PLAN? Because every person with diabetes mellitus is different, there is not one meal plan that works for everyone. It is very important that you meet with a dietitian who will help you create a meal plan that is just right for you. Document Released: 05/11/2005 Document Revised: 08/19/2013 Document Reviewed: 07/11/2013 [facility] Patient Information 2015 [place], [place]. This information is not intended to replace advice given to you by your health care provider. Make sure you discuss any questions you have with your health care provider. Diabetes and Foot Care Diabetes may cause you to have problems because of poor blood supply (circulation) to your feet and legs. This may cause the skin on your feet to become thinner, break easier, and heal more slowly. Your skin may become dry, and the skin may peel and crack. You may also have nerve damage in your legs and feet causing decreased feeling in them. You may not notice minor injuries to your feet that could lead to infections or more serious problems. Taking care of your feet is one of the most important things you can do for yourself.  HOME CARE INSTRUCTIONS  Wear shoes at all times, even in the house. Do not go barefoot. Bare feet are easily injured.  Check your feet daily for blisters, cuts, and redness. If you cannot see the bottom of your feet, use a mirror or ask someone for help.  Wash your feet with warm water (do not use hot water) and mild soap. Then pat your feet and the areas between your toes until they are completely dry.  Do not soak your feet as this can dry your skin.  Apply a moisturizing lotion or petroleum jelly (that does not contain alcohol and is unscented) to the skin on your feet and to dry, brittle toenails. Do not apply lotion between your toes.  Trim your toenails straight across. Do not dig under them or around the cuticle. File the edges of your nails with an emery board or nail file.  Do not cut corns or calluses or try to remove them with medicine.  Wear clean socks or stockings every day. Make sure they are not too tight. Do not wear knee-high stockings since they may decrease blood flow to your legs.  Wear shoes that fit properly and have enough cushioning. To break in new shoes, wear them for just a few hours a day. This prevents you from injuring your feet. Always look in your shoes before you put them on to be sure there are no  objects inside.  Do not cross your legs. This may decrease the blood flow to your feet.  If you find a minor scrape, cut, or break in the skin on your feet, keep it and the skin around it clean and dry. These areas may be cleansed with mild soap and water. Do not cleanse the area with peroxide, alcohol, or iodine.  When you remove an adhesive bandage, be sure not to damage the skin around it.  If you have a wound, look at it several times a day to make sure it is healing.  Do not use heating pads or hot water bottles. They may burn your skin. If you have lost feeling in your feet or legs, you may not know it is happening until it is too late.  Make sure your health care provider performs a complete foot exam at least annually or more often if you have foot problems. Report any cuts, sores, or bruises to your health care provider immediately. SEEK MEDICAL CARE IF:   You have an injury that is not healing.  You have cuts or breaks in the skin.  You have an ingrown nail.  You notice redness on your legs or feet.  You feel burning or tingling in your legs or  feet.  You have pain or cramps in your legs and feet.  Your legs or feet are numb.  Your feet always feel cold. SEEK IMMEDIATE MEDICAL CARE IF:   There is increasing redness, swelling, or pain in or around a wound.  There is a red line that goes up your leg.  Pus is coming from a wound.  You develop a fever or as directed by your health care provider.  You notice a bad smell coming from an ulcer or wound. Document Released: 08/11/2000 Document Revised: 04/16/2013 Document Reviewed: 01/21/2013 [facility] Patient Information 2015 [place], [place]. This information is not intended to replace advice given to you by your health care provider. Make sure you discuss any questions you have with your health care provider. Diabetes and Exercise Exercising regularly is important. It is not just about losing weight. It has many health benefits, such as:  Improving your overall fitness, flexibility, and endurance.  Increasing your bone density.  Helping with weight control.  Decreasing your body fat.  Increasing your muscle strength.  Reducing stress and tension.  Improving your overall health. People with diabetes who exercise gain additional benefits because exercise:  Reduces appetite.  Improves the body's use of blood sugar (glucose).  Helps lower or control blood glucose.  Decreases blood pressure.  Helps control blood lipids (such as cholesterol and triglycerides).  Improves the body's use of the hormone insulin by:  Increasing the body's insulin sensitivity.  Reducing the body's insulin needs.  Decreases the risk for heart disease because exercising:  Lowers cholesterol and triglycerides levels.  Increases the levels of good cholesterol (such as high-density lipoproteins [HDL]) in the body.  Lowers blood glucose levels. YOUR ACTIVITY PLAN  Choose an activity that you enjoy and set realistic goals. Your health care provider or diabetes educator can help you make an  activity plan that works for you. Exercise regularly as directed by your health care provider. This includes:  Performing resistance training twice a week such as push-ups, sit-ups, lifting weights, or using resistance bands.  Performing 150 minutes of cardio exercises each week such as walking, running, or playing sports.  Staying active and spending no more than 90 minutes at one time being  inactive. Even short bursts of exercise are good for you. Three 10-minute sessions spread throughout the day are just as beneficial as a single 30-minute session. Some exercise ideas include:  Taking the dog for a walk.  Taking the stairs instead of the elevator.  Dancing to your favorite song.  Doing an exercise video.  Doing your favorite exercise with a friend. RECOMMENDATIONS FOR EXERCISING WITH TYPE 1 OR TYPE 2 DIABETES   Check your blood glucose before exercising. If blood glucose levels are greater than 240 mg/dL, check for urine ketones. Do not exercise if ketones are present.  Avoid injecting insulin into areas of the body that are going to be exercised. For example, avoid injecting insulin into:  The arms when playing tennis.  The legs when jogging.  Keep a record of:  Food intake before and after you exercise.  Expected peak times of insulin action.  Blood glucose levels before and after you exercise.  The type and amount of exercise you have done.  Review your records with your health care provider. Your health care provider will help you to develop guidelines for adjusting food intake and insulin amounts before and after exercising.  If you take insulin or oral hypoglycemic agents, watch for signs and symptoms of hypoglycemia. They include:  Dizziness.  Shaking.  Sweating.  Chills.  Confusion.  Drink plenty of water while you exercise to prevent dehydration or heat stroke. Body water is lost during exercise and must be replaced.  Talk to your health care  provider before starting an exercise program to make sure it is safe for you. Remember, almost any type of activity is better than none. Document Released: 11/04/2003 Document Revised: 12/29/2013 Document Reviewed: 01/21/2013 [facility] Patient Information 2015 [place], [place]. This information is not intended to replace advice given to you by your health care provider. Make sure you discuss any questions you have with your health care provider. Basic Carbohydrate Counting for Diabetes Mellitus Carbohydrate counting is a method for keeping track of the amount of carbohydrates you eat. Eating carbohydrates naturally increases the level of sugar (glucose) in your blood, so it is important for you to know the amount that is okay for you to have in every meal. Carbohydrate counting helps keep the level of glucose in your blood within normal limits. The amount of carbohydrates allowed is different for every person. A dietitian can help you calculate the amount that is right for you. Once you know the amount of carbohydrates you can have, you can count the carbohydrates in the foods you want to eat. Carbohydrates are found in the following foods:  Grains, such as breads and cereals.  Dried beans and soy products.  Starchy vegetables, such as potatoes, peas, and corn.  Fruit and fruit juices.  Milk and yogurt.  Sweets and snack foods, such as cake, cookies, candy, chips, soft drinks, and fruit drinks. CARBOHYDRATE COUNTING There are two ways to count the carbohydrates in your food. You can use either of the methods or a combination of both. Reading the "Nutrition Facts" on [place] The "Nutrition Facts" is an area that is included on the labels of almost all packaged food and beverages in the [place]. It includes the serving size of that food or beverage and information about the nutrients in each serving of the food, including the grams (g) of carbohydrate per serving.  Decide the number of  servings of this food or beverage that you will be able to eat or  drink. Multiply that number of servings by the number of grams of carbohydrate that is listed on the label for that serving. The total will be the amount of carbohydrates you will be having when you eat or drink this food or beverage. Learning Standard Serving Sizes of Food When you eat food that is not packaged or does not include "Nutrition Facts" on the label, you need to measure the servings in order to count the amount of carbohydrates.A serving of most carbohydrate-rich foods contains about 15 g of carbohydrates. The following list includes serving sizes of carbohydrate-rich foods that provide 15 g ofcarbohydrate per serving:   1 slice of bread (1 oz) or 1 six-inch tortilla.    of a hamburger bun or English muffin.  4-6 crackers.   cup unsweetened dry cereal.    cup hot cereal.   cup rice or pasta.    cup mashed potatoes or  of a large baked potato.  1 cup fresh fruit or one small piece of fruit.    cup canned or frozen fruit or fruit juice.  1 cup milk.   cup plain fat-free yogurt or yogurt sweetened with artificial sweeteners.   cup cooked dried beans or starchy vegetable, such as peas, corn, or potatoes.  Decide the number of standard-size servings that you will eat. Multiply that number of servings by 15 (the grams of carbohydrates in that serving). For example, if you eat 2 cups of strawberries, you will have eaten 2 servings and 30 g of carbohydrates (2 servings x 15 g = 30 g). For foods such as soups and casseroles, in which more than one food is mixed in, you will need to count the carbohydrates in each food that is included. EXAMPLE OF CARBOHYDRATE COUNTING Sample Dinner  3 oz chicken breast.   cup of brown rice.   cup of corn.  1 cup milk.   1 cup strawberries with sugar-free whipped topping.  Carbohydrate Calculation Step 1: Identify the foods that contain carbohydrates:    Rice.   Corn.   Milk.   Strawberries. Step 2:Calculate the number of servings eaten of each:   2 servings of rice.   1 serving of corn.   1 serving of milk.   1 serving of strawberries. Step 3: Multiply each of those number of servings by 15 g:   2 servings of rice x 15 g = 30 g.   1 serving of corn x 15 g = 15 g.   1 serving of milk x 15 g = 15 g.   1 serving of strawberries x 15 g = 15 g. Step 4: Add together all of the amounts to find the total grams of carbohydrates eaten: 30 g + 15 g + 15 g + 15 g = 75 g. Document Released: 08/14/2005 Document Revised: 12/29/2013 Document Reviewed: 07/11/2013 [facility] Patient Information 2015 [place], [place]. This information is not intended to replace advice given to you by your health care provider. Make sure you discuss any questions you have with your health care provider.

## 2015-01-04 NOTE — Progress Notes (Signed)
Patient presents for BP check after increasing hydralazine to qid; however, patient states only remembering to take 2-3 X per day Suggested setting timer on cell phone to remind her to take all 4 doses Discussed need for low sodium diet and using Mrs. Dash as alternative to salt Encouraged to choose foods with 5% or less of daily value for sodium. Patient states she usually walks 10 minutes per day but it is difficult due to chronic SHOB and b/l knee and lower leg pain Discussed walking 30 minutes per day in 10 minute increments as tolerated Positive for headaches, blurred vision, and chest pain but not at present States [facility] is constant; quit smoking 4 weeks ago C/o b/l knee pain. States right knee "wants to buckle"; rates pain 10/10 at present. Ambulates with 4 prong cane  Denies numbness, burning or tingling sensation in lower extremities No LE edema noted  Filed Vitals:   01/04/15 0953  BP: 120/82  Pulse: 93  Temp: 98.4 F (36.9 C)  Resp: 24    Per PCP: F/u with PCP for b/l knee and leg pain  Patient advised to call for med refills at least 7 days before running out so as not to go without.  Patient given literature on DASH Eating Plan, Diabetes and Food, Diabetes and Exercise, Basic Carb Counting, Diabetic Neuropathy and  Diabetes and Foot Care

## 2015-01-05 ENCOUNTER — Ambulatory Visit: Payer: Self-pay | Attending: Family Medicine | Admitting: Family Medicine

## 2015-01-05 ENCOUNTER — Encounter: Payer: Self-pay | Admitting: Family Medicine

## 2015-01-05 VITALS — BP 122/82 | HR 88 | Temp 99.0°F | Resp 18 | Ht 61.0 in | Wt 242.0 lb

## 2015-01-05 DIAGNOSIS — E118 Type 2 diabetes mellitus with unspecified complications: Secondary | ICD-10-CM | POA: Insufficient documentation

## 2015-01-05 DIAGNOSIS — E1165 Type 2 diabetes mellitus with hyperglycemia: Secondary | ICD-10-CM

## 2015-01-05 DIAGNOSIS — IMO0002 Reserved for concepts with insufficient information to code with codable children: Secondary | ICD-10-CM

## 2015-01-05 DIAGNOSIS — M25562 Pain in left knee: Secondary | ICD-10-CM | POA: Insufficient documentation

## 2015-01-05 DIAGNOSIS — R202 Paresthesia of skin: Secondary | ICD-10-CM | POA: Insufficient documentation

## 2015-01-05 DIAGNOSIS — E559 Vitamin D deficiency, unspecified: Secondary | ICD-10-CM

## 2015-01-05 DIAGNOSIS — Z87891 Personal history of nicotine dependence: Secondary | ICD-10-CM | POA: Insufficient documentation

## 2015-01-05 DIAGNOSIS — Z794 Long term (current) use of insulin: Secondary | ICD-10-CM | POA: Insufficient documentation

## 2015-01-05 DIAGNOSIS — R200 Anesthesia of skin: Secondary | ICD-10-CM | POA: Insufficient documentation

## 2015-01-05 DIAGNOSIS — I10 Essential (primary) hypertension: Secondary | ICD-10-CM | POA: Insufficient documentation

## 2015-01-05 DIAGNOSIS — M25561 Pain in right knee: Secondary | ICD-10-CM | POA: Insufficient documentation

## 2015-01-05 LAB — VITAMIN B12: Vitamin B-12: 427 pg/mL (ref 211–911)

## 2015-01-05 LAB — GLUCOSE, POCT (MANUAL RESULT ENTRY): POC Glucose: 189 mg/dl — AB (ref 70–99)

## 2015-01-05 MED ORDER — METHYLPREDNISOLONE ACETATE 40 MG/ML IJ SUSP
40.0000 mg | Freq: Once | INTRAMUSCULAR | Status: AC
  Administered 2015-01-05: 40 mg via INTRA_ARTICULAR

## 2015-01-05 NOTE — Assessment & Plan Note (Signed)
R wrist numbness: Checking vit D and B12 Wear a wrist splint 8 hrs of the day (during sleep is fine) Numbness that comes and goes is not a stroke

## 2015-01-05 NOTE — Progress Notes (Signed)
F/U Stroke  Complaining of Knee pain bilateral Rt Knee pain is stronger

## 2015-01-05 NOTE — Assessment & Plan Note (Signed)
A: b/l knee pain, R>L, OA suspected P: R knee injection today  Standing knee x-rays

## 2015-01-05 NOTE — Assessment & Plan Note (Addendum)
HTN: BP great Hydralazine 8 AM, 1 PM, 6 PM, 10 PM You can buy a BP cuff at any medical supply store: check out [facility]

## 2015-01-05 NOTE — Patient Instructions (Signed)
Diamond Rice,   Thank you for coming in today  1. R knee pain: Injection today You have received a shot of steroid in your joint today. Rest and ice knee today. Regular activity tomorrow. Look out for redness, swelling, fever,severe pain in joint and call if you experience these symptoms. Standing knee x-rays  2. HTN: BP great Hydralazine 8 AM, 1 PM, 6 PM, 10 PM You can buy a BP cuff at any medical supply store: check out [facility]  3. R wrist numbness: Checking vit D and B12 Wear a wrist splint 8 hrs of the day (during sleep is fine) Numbness that comes and goes is not a stroke   F/u in 2-4 weeks for L knee injection  Dr. Adrian Blackwater

## 2015-01-05 NOTE — Progress Notes (Signed)
   Subjective:    Patient ID: Diamond Rice, female    DOB: 1970-11-09, 44 y.o.   MRN: [id number] CC: b/l l knee pain. R hand numbness    HPI  1. B/l knee pain: chronic. R >L. No effusion. Some popping. No locking. No recent injury.  2. R hand numbness: comes and goes x 1 month. No weakness in hand. No pain. Had stroke with L sided symptoms. Still weak on L side.   3. HTN: compliant with regimen. BP coming down. Requesting home BP monitor. Uninsured. Low income.   Soc Hx:  Former smoker  Review of Systems GAD-7: score of 14. 2-1,4,6,7. 3-2,3.     Objective:   Physical Exam BP 122/82 mmHg  Pulse 88  Temp(Src) 99 F (37.2 C) (Oral)  Resp 18  Ht 5\' 1"  (1.549 m)  Wt 242 lb (109.77 kg)  BMI 45.75 kg/m2  SpO2 100%  LMP 12/15/2014 General appearance: alert, cooperative and no distress Lungs: normal WOB  Extremities:  R knee: decreased extension, no erythema or effusion  R wrist: full ROM, normal grip strength, negative Phanel and Finkelstein   After obtaining informed consent and cleaning the skin using iodine and alcohol a  steroid injection was performed at R knee using 1% plain Lidocaine and 40 mg of Depo Medrol. This was well tolerated        Assessment & Plan:

## 2015-01-06 DIAGNOSIS — E559 Vitamin D deficiency, unspecified: Secondary | ICD-10-CM | POA: Insufficient documentation

## 2015-01-06 LAB — VITAMIN D 25 HYDROXY (VIT D DEFICIENCY, FRACTURES): Vit D, 25-Hydroxy: 13 ng/mL — ABNORMAL LOW (ref 30–100)

## 2015-01-06 MED ORDER — VITAMIN D (ERGOCALCIFEROL) 1.25 MG (50000 UNIT) PO CAPS
50000.0000 [IU] | ORAL_CAPSULE | ORAL | Status: DC
Start: 2015-01-06 — End: 2015-10-21

## 2015-01-06 NOTE — Assessment & Plan Note (Signed)
A: vit D def P: 50 K U vit D x 12 weeks

## 2015-01-06 NOTE — Addendum Note (Signed)
Addended by: Boykin Nearing on: 01/06/2015 09:29 AM   Modules accepted: Orders

## 2015-01-13 ENCOUNTER — Telehealth: Payer: Self-pay | Admitting: General Practice

## 2015-01-13 DIAGNOSIS — I6381 Other cerebral infarction due to occlusion or stenosis of small artery: Secondary | ICD-10-CM

## 2015-01-13 NOTE — Telephone Encounter (Signed)
Patient presents to clinic to inform PCP that she found a place that does physical therapy free of charge. The name of the place is [facility] ([place] Telephone number [phone]. Fax number [phone] Patient states she spoke to someone at this location and was advised to share this information with PCP and request a referral.  Please follow up with patient and [facility] to assist.

## 2015-01-14 NOTE — Telephone Encounter (Signed)
Please call back to patient, [facility] like a good option. I have placed a referral.

## 2015-01-22 ENCOUNTER — Telehealth: Payer: Self-pay | Admitting: *Deleted

## 2015-01-22 NOTE — Telephone Encounter (Signed)
Pt aware of results 

## 2015-01-22 NOTE — Telephone Encounter (Signed)
-----   Message from Boykin Nearing, MD sent at 01/06/2015  9:26 AM EDT ----- Vit D def will replace Normal B12

## 2015-01-27 ENCOUNTER — Ambulatory Visit: Payer: Self-pay | Attending: Family Medicine | Admitting: Family Medicine

## 2015-01-27 ENCOUNTER — Other Ambulatory Visit: Payer: Self-pay | Admitting: *Deleted

## 2015-01-27 ENCOUNTER — Encounter: Payer: Self-pay | Admitting: Family Medicine

## 2015-01-27 DIAGNOSIS — M25562 Pain in left knee: Secondary | ICD-10-CM | POA: Insufficient documentation

## 2015-01-27 DIAGNOSIS — M25561 Pain in right knee: Secondary | ICD-10-CM

## 2015-01-27 DIAGNOSIS — G8929 Other chronic pain: Secondary | ICD-10-CM | POA: Insufficient documentation

## 2015-01-27 DIAGNOSIS — Z87891 Personal history of nicotine dependence: Secondary | ICD-10-CM | POA: Insufficient documentation

## 2015-01-27 MED ORDER — TRAMADOL HCL 50 MG PO TABS: 50.0000 mg | ORAL_TABLET | Freq: Three times a day (TID) | ORAL | Status: DC | PRN

## 2015-01-27 MED ORDER — METHYLPREDNISOLONE ACETATE 40 MG/ML IJ SUSP
40.0000 mg | Freq: Once | INTRAMUSCULAR | Status: AC
  Administered 2015-01-27: 40 mg via INTRA_ARTICULAR

## 2015-01-27 NOTE — Addendum Note (Signed)
Addended by: Boykin Nearing on: 01/27/2015 10:14 AM   Modules accepted: Orders

## 2015-01-27 NOTE — Assessment & Plan Note (Signed)
A: L knee pain: due to OA vs soft tissue injury. This is chronic pain.  P: Injection today You have received a shot of steroid in your joint today. Rest and ice knee today. Regular activity tomorrow. Look out for redness, swelling, fever,severe pain in joint and call if you experience these symptoms.  At your convenience please go for standing knee x-rays ordered at last visit.

## 2015-01-27 NOTE — Patient Instructions (Signed)
Diamond Rice,   Thank you for coming in today  1. L knee pain: Injection today You have received a shot of steroid in your joint today. Rest and ice knee today. Regular activity tomorrow. Look out for redness, swelling, fever,severe pain in joint and call if you experience these symptoms.  At your convenience please go for standing knee x-rays ordered at last visit.   F/u in 3 months for HTN   Dr. Adrian Blackwater

## 2015-01-27 NOTE — Progress Notes (Signed)
F/U knee pain  Lt knee INJ

## 2015-01-27 NOTE — Progress Notes (Signed)
   Subjective:    Patient ID: Diamond Rice, female    DOB: September 10, 1970, 44 y.o.   MRN: [id number] CC: f/u b/l knee pain, L knee corticosteroid injection   HPI  1. B/l knee pain: chronic. R >L. No effusion. Some popping. No locking. No recent injury. Had R knee corticosteroid injection done at last visit. Had significant pain in R knee following injection. No redness or worsening swelling. Pain in knee is doing better now. No fever. Has not had standing knee x-rays done yet.   Soc Hx:  Former smoker  Review of Systems GAD-7: score of 14. 2-1,4,6,7. 3-2,3.     Objective:   Physical Exam  BP 135/89 mmHg  Pulse 88  Temp(Src) 99.2 F (37.3 C) (Oral)  Resp 16  Ht 5\' 1"  (1.549 m)  Wt 250 lb (113.399 kg)  BMI 47.26 kg/m2  SpO2 97%  LMP 01/14/2015 General appearance: alert, cooperative and no distress Lungs: normal WOB  Extremities:   After obtaining informed consent and cleaning the skin using iodine and alcohol a  steroid injection was performed at L knee using 1% plain Lidocaine and 40 mg of Depo Medrol. This was well tolerated     Assessment & Plan:

## 2015-02-02 ENCOUNTER — Ambulatory Visit: Payer: Self-pay | Admitting: Physical Therapy

## 2015-02-19 ENCOUNTER — Encounter (HOSPITAL_BASED_OUTPATIENT_CLINIC_OR_DEPARTMENT_OTHER): Payer: Self-pay

## 2015-05-10 ENCOUNTER — Ambulatory Visit (HOSPITAL_BASED_OUTPATIENT_CLINIC_OR_DEPARTMENT_OTHER): Payer: Self-pay | Attending: Family Medicine | Admitting: Radiology

## 2015-05-10 DIAGNOSIS — E669 Obesity, unspecified: Secondary | ICD-10-CM | POA: Insufficient documentation

## 2015-05-10 DIAGNOSIS — I493 Ventricular premature depolarization: Secondary | ICD-10-CM | POA: Insufficient documentation

## 2015-05-10 DIAGNOSIS — I10 Essential (primary) hypertension: Secondary | ICD-10-CM | POA: Insufficient documentation

## 2015-05-10 DIAGNOSIS — G4733 Obstructive sleep apnea (adult) (pediatric): Secondary | ICD-10-CM | POA: Insufficient documentation

## 2015-05-10 DIAGNOSIS — R0683 Snoring: Secondary | ICD-10-CM | POA: Insufficient documentation

## 2015-05-10 DIAGNOSIS — R0600 Dyspnea, unspecified: Secondary | ICD-10-CM

## 2015-05-10 DIAGNOSIS — E119 Type 2 diabetes mellitus without complications: Secondary | ICD-10-CM | POA: Insufficient documentation

## 2015-05-10 DIAGNOSIS — Z6841 Body Mass Index (BMI) 40.0 and over, adult: Secondary | ICD-10-CM | POA: Insufficient documentation

## 2015-05-15 ENCOUNTER — Encounter (HOSPITAL_BASED_OUTPATIENT_CLINIC_OR_DEPARTMENT_OTHER): Payer: Self-pay | Admitting: Internal Medicine

## 2015-05-15 DIAGNOSIS — R0600 Dyspnea, unspecified: Secondary | ICD-10-CM

## 2015-05-15 NOTE — Progress Notes (Signed)
[name], Diamond Rice Date: 05/10/2015 Gender: Female D.O.B: 01/28/1971 Age (years): 35 Referring Provider: Adriana Mccallum Funches Height (inches): 61 Interpreting Physician: Baird Lyons MD, ABSM Weight (lbs): 250 RPSGT: Carolin Coy BMI: 47  MRN: [id number] Neck Size: 15.50 CLINICAL INFORMATION Sleep Study Type: Split Night CPAP     Indication for sleep study: Diabetes, Hypertension, Obesity, Snoring     Epworth Sleepiness Score: 5/24  SLEEP STUDY TECHNIQUE As per the AASM Manual for the Scoring of Sleep and Associated Events v2.3 (April 2016) with a hypopnea requiring 4% desaturations.  The channels recorded and monitored were frontal, central and occipital EEG, electrooculogram (EOG), submentalis EMG (chin), nasal and oral airflow, thoracic and abdominal wall motion, anterior tibialis EMG, snore microphone, electrocardiogram, and pulse oximetry. Continuous positive airway pressure (CPAP) was initiated when the patient met split night criteria and was titrated according to treat sleep-disordered breathing.  MEDICATIONS Medications taken by the patient : Charted for review Medications administered by patient during sleep study :tramadol, xanax, remeron  RESPIRATORY PARAMETERS Diagnostic  Total AHI (/hr): 15.9 RDI (/hr): 24.8 OA Index (/hr): 0.9 CA Index (/hr): 0.0 REM AHI (/hr): N/A NREM AHI (/hr): 15.9 Supine AHI (/hr): N/A Non-supine AHI (/hr): 15.95 Min O2 Sat (%): 0.00 Mean O2 (%): 92.13 Time below 88% (min): 0.3   Titration  Optimal Pressure (cm): 13 AHI at Optimal Pressure (/hr): 0.0 Min O2 at Optimal Pressure (%): 90.0 Supine % at Optimal (%): 0 Sleep % at Optimal (%): 96    SLEEP ARCHITECTURE The recording time for the entire night was 405.8 minutes.  During a baseline period of 144.9 minutes, the patient slept for 135.4 minutes in REM and nonREM, yielding a sleep efficiency of 93.5%. Sleep onset after lights out was 4.5 minutes with a REM  latency of N/A minutes. The patient spent 7.01% of the night in stage N1 sleep, 92.99% in stage N2 sleep, 0.00% in stage N3 and 0.00% in REM.  During the titration period of 253.0 minutes, the patient slept for 233.3 minutes in REM and nonREM, yielding a sleep efficiency of 92.2%. Sleep onset after CPAP initiation was 4.7 minutes with a REM latency of 81.5 minutes. The patient spent 6.64% of the night in stage N1 sleep, 74.57% in stage N2 sleep, 0.00% in stage N3 and 18.78% in REM.  CARDIAC DATA The 2 lead EKG demonstrated sinus rhythm. The mean heart rate was 82.59 beats per minute. Other EKG findings include: PVCs.  LEG MOVEMENT DATA The total Periodic Limb Movements of Sleep (PLMS) were 46. The PLMS index was 7.47 .  IMPRESSIONS Moderate obstructive sleep apnea occurred during the diagnostic portion of the study(AHI = 15.9/hour). An optimal PAP pressure was selected for this patient ( 13 cm of water) No significant central sleep apnea occurred during the diagnostic portion of the study (CAI = 0.0/hour). Severe oxygen desaturation was noted during the diagnostic portion of the study (Min O2 = 81%). Mean O2 saturation 93.2% The patient snored with Moderate snoring volume during the diagnostic portion of the study. EKG findings include PVCs. Mild periodic limb movements of sleep occurred during the study.  DIAGNOSIS Obstructive Sleep Apnea (327.23 [G47.33 ICD-10])  RECOMMENDATIONS Trial of CPAP therapy on 13 cm H2O with a Small size Resmed Full Face Mask AirFit F10 for Her mask and heated humidification. Avoid alcohol, sedatives and other CNS depressants that may worsen sleep apnea and disrupt normal sleep architecture. Sleep hygiene should be reviewed to assess factors that may improve sleep quality.  Weight management and regular exercise should be initiated or continued.    Deneise Lever Diplomate, American Board of Sleep Medicine  ELECTRONICALLY SIGNED ON:  05/15/2015, 9:52  AM [place] PH: [phone]   FX: [phone] [place]

## 2015-05-26 ENCOUNTER — Telehealth: Payer: Self-pay | Admitting: Family Medicine

## 2015-05-26 NOTE — Telephone Encounter (Signed)
Patient called and requested sleep study results, please f/u with pt.

## 2015-06-08 NOTE — Telephone Encounter (Signed)
Patient came into facility requesting her sleep study results. Please f/u with pt.

## 2015-06-09 ENCOUNTER — Telehealth: Payer: Self-pay | Admitting: Family Medicine

## 2015-06-09 DIAGNOSIS — G4733 Obstructive sleep apnea (adult) (pediatric): Secondary | ICD-10-CM | POA: Insufficient documentation

## 2015-06-09 NOTE — Assessment & Plan Note (Signed)
Trial of CPAP therapy on 13 cm H2O with a Small size Resmed Full Face Mask AirFit F10 for Her mask and heated humidification. Avoid alcohol, sedatives and other CNS depressants that may worsen sleep apnea and disrupt normal sleep architecture. Sleep hygiene should be reviewed to assess factors that may improve sleep quality. Weight management and regular exercise should be initiated or continued

## 2015-06-09 NOTE — Telephone Encounter (Signed)
-----   Message from Nila Nephew, RN sent at 06/08/2015  4:14 PM EDT ----- Patient came in looking for sleep study results. Dr. Adrian Blackwater have you resulted this? I see the results in the box but I don't see that you have resulted them and sent them to [facility] be given to the patient. Can you or Junious Dresser please call the patient with the results.

## 2015-06-09 NOTE — Telephone Encounter (Signed)
Date of Birth verified by pt Sleep study results given to pt  Obstructive sleep apnea  Advised to avoid ETOH and wt loss Pt do not have Insurance  Send information to [place] for CPAP

## 2015-06-09 NOTE — Telephone Encounter (Signed)
Diamond Rice, Please call patient she has obstructive sleep apnea CPAP, weight loss and avoiding alcohol recommended.  Does she have insurance?  If not will initiate CPAP order with [facility]   Trial of CPAP therapy on 13 cm H2O with a Small size Resmed Full Face Mask AirFit F10 for Her mask and heated humidification. Avoid alcohol, sedatives and other CNS depressants that may worsen sleep apnea and disrupt normal sleep architecture. Sleep hygiene should be reviewed to assess factors that may improve sleep quality. Weight management and regular exercise should be initiated or continued

## 2015-09-08 MED FILL — hydrALAZINE HCL 10 MG TABS: 10 | 30 days supply | Qty: 90 | Fill #3

## 2015-09-08 MED FILL — AMLODIPINE BESYLATE 10 MG T: 10 | 30 days supply | Qty: 30 | Fill #5

## 2015-09-08 MED FILL — LISINOPRIL-HCTZ 20-12.5 MG: 20-12.5 | 30 days supply | Qty: 60 | Fill #5

## 2015-09-08 MED FILL — ?MIRTAZAPINE 15 MG TABLET: 15 | 30 days supply | Qty: 15 | Fill #0

## 2015-09-08 MED FILL — ?PANTOPRAZOLE SOD DR 40MG: 40 MG | 30 days supply | Qty: 30 | Fill #4

## 2015-09-08 MED FILL — ?GLIMEPIRIDE 4 MG TABLET: 4 | 30 days supply | Qty: 60 | Fill #5

## 2015-10-07 ENCOUNTER — Encounter (HOSPITAL_COMMUNITY): Payer: Self-pay

## 2015-10-07 ENCOUNTER — Emergency Department (HOSPITAL_COMMUNITY): Payer: MEDICAID

## 2015-10-07 ENCOUNTER — Inpatient Hospital Stay (HOSPITAL_COMMUNITY)
Admission: AD | Admit: 2015-10-07 | Discharge: 2015-10-21 | DRG: 885 | Disposition: A | Discharge status: Home / Self Care | Payer: Federal, State, Local not specified - Other | Source: Intra-hospital | Attending: Psychiatry | Admitting: Psychiatry

## 2015-10-07 ENCOUNTER — Emergency Department (HOSPITAL_COMMUNITY)
Admission: EM | Admit: 2015-10-07 | Discharge: 2015-10-07 | Disposition: A | Discharge status: Other Acute Inpatient Hospital | Payer: MEDICAID | Attending: Emergency Medicine | Admitting: Emergency Medicine

## 2015-10-07 ENCOUNTER — Other Ambulatory Visit: Payer: Self-pay

## 2015-10-07 DIAGNOSIS — Z79899 Other long term (current) drug therapy: Secondary | ICD-10-CM | POA: Insufficient documentation

## 2015-10-07 DIAGNOSIS — Z6841 Body Mass Index (BMI) 40.0 and over, adult: Secondary | ICD-10-CM

## 2015-10-07 DIAGNOSIS — I10 Essential (primary) hypertension: Secondary | ICD-10-CM | POA: Diagnosis present

## 2015-10-07 DIAGNOSIS — G4700 Insomnia, unspecified: Secondary | ICD-10-CM | POA: Diagnosis present

## 2015-10-07 DIAGNOSIS — G43909 Migraine, unspecified, not intractable, without status migrainosus: Secondary | ICD-10-CM | POA: Insufficient documentation

## 2015-10-07 DIAGNOSIS — F1410 Cocaine abuse, uncomplicated: Secondary | ICD-10-CM | POA: Insufficient documentation

## 2015-10-07 DIAGNOSIS — I209 Angina pectoris, unspecified: Secondary | ICD-10-CM | POA: Insufficient documentation

## 2015-10-07 DIAGNOSIS — I69354 Hemiplegia and hemiparesis following cerebral infarction affecting left non-dominant side: Secondary | ICD-10-CM | POA: Diagnosis not present

## 2015-10-07 DIAGNOSIS — IMO0002 Reserved for concepts with insufficient information to code with codable children: Secondary | ICD-10-CM

## 2015-10-07 DIAGNOSIS — F17210 Nicotine dependence, cigarettes, uncomplicated: Secondary | ICD-10-CM | POA: Diagnosis present

## 2015-10-07 DIAGNOSIS — E782 Mixed hyperlipidemia: Secondary | ICD-10-CM | POA: Insufficient documentation

## 2015-10-07 DIAGNOSIS — F250 Schizoaffective disorder, bipolar type: Secondary | ICD-10-CM | POA: Diagnosis present

## 2015-10-07 DIAGNOSIS — R45851 Suicidal ideations: Secondary | ICD-10-CM | POA: Diagnosis present

## 2015-10-07 DIAGNOSIS — G8929 Other chronic pain: Secondary | ICD-10-CM | POA: Insufficient documentation

## 2015-10-07 DIAGNOSIS — F333 Major depressive disorder, recurrent, severe with psychotic symptoms: Principal | ICD-10-CM | POA: Diagnosis present

## 2015-10-07 DIAGNOSIS — Z7984 Long term (current) use of oral hypoglycemic drugs: Secondary | ICD-10-CM | POA: Insufficient documentation

## 2015-10-07 DIAGNOSIS — K219 Gastro-esophageal reflux disease without esophagitis: Secondary | ICD-10-CM | POA: Diagnosis present

## 2015-10-07 DIAGNOSIS — F329 Major depressive disorder, single episode, unspecified: Secondary | ICD-10-CM | POA: Insufficient documentation

## 2015-10-07 DIAGNOSIS — Z590 Homelessness: Secondary | ICD-10-CM

## 2015-10-07 DIAGNOSIS — R011 Cardiac murmur, unspecified: Secondary | ICD-10-CM | POA: Insufficient documentation

## 2015-10-07 DIAGNOSIS — E559 Vitamin D deficiency, unspecified: Secondary | ICD-10-CM

## 2015-10-07 DIAGNOSIS — F1210 Cannabis abuse, uncomplicated: Secondary | ICD-10-CM | POA: Insufficient documentation

## 2015-10-07 DIAGNOSIS — E119 Type 2 diabetes mellitus without complications: Secondary | ICD-10-CM | POA: Diagnosis present

## 2015-10-07 DIAGNOSIS — E118 Type 2 diabetes mellitus with unspecified complications: Secondary | ICD-10-CM

## 2015-10-07 DIAGNOSIS — J449 Chronic obstructive pulmonary disease, unspecified: Secondary | ICD-10-CM | POA: Diagnosis present

## 2015-10-07 DIAGNOSIS — F32A Depression, unspecified: Secondary | ICD-10-CM

## 2015-10-07 DIAGNOSIS — R079 Chest pain, unspecified: Secondary | ICD-10-CM

## 2015-10-07 DIAGNOSIS — E1165 Type 2 diabetes mellitus with hyperglycemia: Secondary | ICD-10-CM

## 2015-10-07 DIAGNOSIS — E1151 Type 2 diabetes mellitus with diabetic peripheral angiopathy without gangrene: Secondary | ICD-10-CM | POA: Diagnosis present

## 2015-10-07 DIAGNOSIS — Z794 Long term (current) use of insulin: Secondary | ICD-10-CM | POA: Insufficient documentation

## 2015-10-07 DIAGNOSIS — Z7982 Long term (current) use of aspirin: Secondary | ICD-10-CM | POA: Insufficient documentation

## 2015-10-07 DIAGNOSIS — F419 Anxiety disorder, unspecified: Secondary | ICD-10-CM | POA: Insufficient documentation

## 2015-10-07 DIAGNOSIS — E669 Obesity, unspecified: Secondary | ICD-10-CM | POA: Insufficient documentation

## 2015-10-07 DIAGNOSIS — Z8673 Personal history of transient ischemic attack (TIA), and cerebral infarction without residual deficits: Secondary | ICD-10-CM | POA: Insufficient documentation

## 2015-10-07 LAB — SALICYLATE LEVEL: Salicylate Lvl: <4 mg/dL (ref 2.8–30.0)

## 2015-10-07 LAB — CBC
HEMATOCRIT: 37 % (ref 36.0–46.0)
HEMOGLOBIN: 11.9 g/dL — AB (ref 12.0–15.0)
MCH: 22.5 pg — AB (ref 26.0–34.0)
MCHC: 32.2 g/dL (ref 30.0–36.0)
MCV: 69.9 fL — AB (ref 78.0–100.0)
Platelets: 349 10*3/uL (ref 150–400)
RBC: 5.29 MIL/uL — AB (ref 3.87–5.11)
RDW: 17 % — ABNORMAL HIGH (ref 11.5–15.5)
WBC: 9.3 10*3/uL (ref 4.0–10.5)

## 2015-10-07 LAB — BASIC METABOLIC PANEL
ANION GAP: 12 (ref 5–15)
BUN: 7 mg/dL (ref 6–20)
CO2: 23 mmol/L (ref 22–32)
Calcium: 9.3 mg/dL (ref 8.9–10.3)
Chloride: 102 mmol/L (ref 101–111)
Creatinine, Ser: 1.19 mg/dL — ABNORMAL HIGH (ref 0.44–1.00)
GFR calc non Af Amer: 55 mL/min — ABNORMAL LOW (ref >60)
Glucose, Bld: 298 mg/dL — ABNORMAL HIGH (ref 65–99)
Potassium: 3.3 mmol/L — ABNORMAL LOW (ref 3.5–5.1)
Sodium: 137 mmol/L (ref 135–145)

## 2015-10-07 LAB — I-STAT TROPONIN, ED
Troponin i, poc: 0 ng/mL (ref 0.00–0.08)
Troponin i, poc: 0 ng/mL (ref 0.00–0.08)

## 2015-10-07 LAB — RAPID URINE DRUG SCREEN, HOSP PERFORMED
AMPHETAMINES: NOT DETECTED
BARBITURATES: NOT DETECTED
BENZODIAZEPINES: NOT DETECTED
COCAINE: POSITIVE — AB
Opiates: NOT DETECTED
TETRAHYDROCANNABINOL: POSITIVE — AB

## 2015-10-07 LAB — CBG MONITORING, ED: Glucose-Capillary: 435 mg/dL — ABNORMAL HIGH (ref 65–99)

## 2015-10-07 LAB — ETHANOL: Alcohol, Ethyl (B): <5 mg/dL (ref <5)

## 2015-10-07 MED ORDER — AMLODIPINE BESYLATE 5 MG PO TABS: 10.0000 mg | ORAL_TABLET | Freq: Every day | ORAL | Status: DC

## 2015-10-07 MED ORDER — NICOTINE 21 MG/24HR TD PT24
21.0000 mg | MEDICATED_PATCH | Freq: Every day | TRANSDERMAL | Status: DC
Start: 2015-10-07 — End: 2015-10-07

## 2015-10-07 MED ORDER — LISINOPRIL-HYDROCHLOROTHIAZIDE 20-12.5 MG PO TABS: 2.0000 | ORAL_TABLET | Freq: Every day | ORAL | Status: DC

## 2015-10-07 MED ORDER — DICLOFENAC SODIUM 1 % TD GEL: 2.0000 g | Freq: Three times a day (TID) | TRANSDERMAL | Status: DC

## 2015-10-07 MED ORDER — LISINOPRIL 20 MG PO TABS
40.0000 mg | ORAL_TABLET | Freq: Every day | ORAL | Status: DC
  Administered 2015-10-07: 40 mg via ORAL
  Filled 2015-10-07: qty 2

## 2015-10-07 MED ORDER — INSULIN GLARGINE 100 UNIT/ML ~~LOC~~ SOLN
13.0000 [IU] | Freq: Every day | SUBCUTANEOUS | Status: DC
  Administered 2015-10-08: 13 [IU] via SUBCUTANEOUS
  Filled 2015-10-07 (×2): qty 0.13

## 2015-10-07 MED ORDER — VITAMIN D (ERGOCALCIFEROL) 1.25 MG (50000 UNIT) PO CAPS
50000.0000 [IU] | ORAL_CAPSULE | ORAL | Status: DC
  Administered 2015-10-08 – 2015-10-15 (×2): 50000 [IU] via ORAL
  Filled 2015-10-07 (×4): qty 1

## 2015-10-07 MED ORDER — PANTOPRAZOLE SODIUM 40 MG PO TBEC
40.0000 mg | DELAYED_RELEASE_TABLET | Freq: Every day | ORAL | Status: DC
  Administered 2015-10-08 – 2015-10-21 (×14): 40 mg via ORAL
  Filled 2015-10-07 (×19): qty 1

## 2015-10-07 MED ORDER — ALUM & MAG HYDROXIDE-SIMETH 200-200-20 MG/5ML PO SUSP: 30.0000 mL | ORAL | Status: DC | PRN

## 2015-10-07 MED ORDER — GLIMEPIRIDE 4 MG PO TABS: 8.0000 mg | ORAL_TABLET | Freq: Every day | ORAL | Status: DC

## 2015-10-07 MED ORDER — HYDROCHLOROTHIAZIDE 25 MG PO TABS
25.0000 mg | ORAL_TABLET | Freq: Every day | ORAL | Status: DC
  Administered 2015-10-08 – 2015-10-12 (×5): 25 mg via ORAL
  Filled 2015-10-07 (×8): qty 1

## 2015-10-07 MED ORDER — HYDROCHLOROTHIAZIDE 25 MG PO TABS
25.0000 mg | ORAL_TABLET | Freq: Every day | ORAL | Status: DC
  Administered 2015-10-07: 25 mg via ORAL
  Filled 2015-10-07: qty 1

## 2015-10-07 MED ORDER — ACETAMINOPHEN 325 MG PO TABS: 650.0000 mg | ORAL_TABLET | ORAL | Status: DC | PRN

## 2015-10-07 MED ORDER — ATORVASTATIN CALCIUM 40 MG PO TABS
40.0000 mg | ORAL_TABLET | Freq: Every day | ORAL | Status: DC
  Administered 2015-10-08 – 2015-10-20 (×13): 40 mg via ORAL
  Filled 2015-10-07 (×4): qty 1
  Filled 2015-10-07: qty 7
  Filled 2015-10-07 (×11): qty 1

## 2015-10-07 MED ORDER — LORAZEPAM 1 MG PO TABS: 1.0000 mg | ORAL_TABLET | Freq: Three times a day (TID) | ORAL | Status: DC | PRN

## 2015-10-07 MED ORDER — ZOLPIDEM TARTRATE 5 MG PO TABS: 5.0000 mg | ORAL_TABLET | Freq: Every evening | ORAL | Status: DC | PRN

## 2015-10-07 MED ORDER — MIRTAZAPINE 15 MG PO TABS
15.0000 mg | ORAL_TABLET | Freq: Every day | ORAL | Status: DC
  Filled 2015-10-07 (×2): qty 1

## 2015-10-07 MED ORDER — PNEUMOCOCCAL VAC POLYVALENT 25 MCG/0.5ML IJ INJ
0.5000 mL | INJECTION | INTRAMUSCULAR | Status: AC
  Administered 2015-10-08: 0.5 mL via INTRAMUSCULAR

## 2015-10-07 MED ORDER — INFLUENZA VAC SPLIT QUAD 0.5 ML IM SUSY
0.5000 mL | PREFILLED_SYRINGE | INTRAMUSCULAR | Status: AC
  Administered 2015-10-08: 0.5 mL via INTRAMUSCULAR
  Filled 2015-10-07: qty 0.5

## 2015-10-07 MED ORDER — IBUPROFEN 200 MG PO TABS
600.0000 mg | ORAL_TABLET | Freq: Three times a day (TID) | ORAL | Status: DC | PRN
  Administered 2015-10-07: 600 mg via ORAL
  Filled 2015-10-07: qty 1

## 2015-10-07 MED ORDER — ATORVASTATIN CALCIUM 10 MG PO TABS
40.0000 mg | ORAL_TABLET | Freq: Every day | ORAL | Status: DC
  Administered 2015-10-07: 40 mg via ORAL
  Filled 2015-10-07: qty 4

## 2015-10-07 MED ORDER — AMLODIPINE BESYLATE 10 MG PO TABS
10.0000 mg | ORAL_TABLET | Freq: Every day | ORAL | Status: DC
  Administered 2015-10-08 – 2015-10-21 (×14): 10 mg via ORAL
  Filled 2015-10-07 (×14): qty 1
  Filled 2015-10-07: qty 7
  Filled 2015-10-07 (×3): qty 1

## 2015-10-07 MED ORDER — LISINOPRIL 20 MG PO TABS
40.0000 mg | ORAL_TABLET | Freq: Every day | ORAL | Status: DC
  Administered 2015-10-08 – 2015-10-21 (×14): 40 mg via ORAL
  Filled 2015-10-07: qty 2
  Filled 2015-10-07: qty 1
  Filled 2015-10-07: qty 2
  Filled 2015-10-07: qty 14
  Filled 2015-10-07: qty 2
  Filled 2015-10-07: qty 1
  Filled 2015-10-07: qty 2
  Filled 2015-10-07: qty 1
  Filled 2015-10-07 (×2): qty 2
  Filled 2015-10-07: qty 1
  Filled 2015-10-07 (×7): qty 2

## 2015-10-07 MED ORDER — INSULIN PEN NEEDLE 31G X 8 MM MISC: 1.0000 "application " | Freq: Every day | Status: DC

## 2015-10-07 MED ORDER — DICLOFENAC SODIUM 1 % TD GEL
2.0000 g | Freq: Three times a day (TID) | TRANSDERMAL | Status: DC
  Administered 2015-10-08 – 2015-10-20 (×18): 2 g via TOPICAL
  Filled 2015-10-07 (×2): qty 100

## 2015-10-07 MED ORDER — GLIMEPIRIDE 4 MG PO TABS
8.0000 mg | ORAL_TABLET | Freq: Every day | ORAL | Status: DC
  Administered 2015-10-08: 8 mg via ORAL
  Filled 2015-10-07 (×2): qty 2
  Filled 2015-10-07: qty 4
  Filled 2015-10-07: qty 2

## 2015-10-07 MED ORDER — ASPIRIN 325 MG PO TABS
325.0000 mg | ORAL_TABLET | Freq: Every day | ORAL | Status: DC
  Administered 2015-10-07: 325 mg via ORAL
  Filled 2015-10-07: qty 1

## 2015-10-07 MED ORDER — HYDRALAZINE HCL 10 MG PO TABS
10.0000 mg | ORAL_TABLET | Freq: Four times a day (QID) | ORAL | Status: DC
  Administered 2015-10-08 – 2015-10-12 (×17): 10 mg via ORAL
  Filled 2015-10-07 (×32): qty 1

## 2015-10-07 MED ORDER — HYDRALAZINE HCL 10 MG PO TABS
10.0000 mg | ORAL_TABLET | Freq: Four times a day (QID) | ORAL | Status: DC
  Administered 2015-10-07: 10 mg via ORAL
  Filled 2015-10-07 (×2): qty 1

## 2015-10-07 MED ORDER — MAGNESIUM HYDROXIDE 400 MG/5ML PO SUSP: 30.0000 mL | Freq: Every day | ORAL | Status: DC | PRN

## 2015-10-07 MED ORDER — INSULIN GLARGINE 100 UNIT/ML ~~LOC~~ SOLN
13.0000 [IU] | Freq: Every day | SUBCUTANEOUS | Status: DC
  Administered 2015-10-07: 13 [IU] via SUBCUTANEOUS
  Filled 2015-10-07: qty 0.13

## 2015-10-07 MED ORDER — ONDANSETRON HCL 4 MG PO TABS: 4.0000 mg | ORAL_TABLET | Freq: Three times a day (TID) | ORAL | Status: DC | PRN

## 2015-10-07 MED ORDER — ACETAMINOPHEN 325 MG PO TABS
650.0000 mg | ORAL_TABLET | Freq: Four times a day (QID) | ORAL | Status: DC | PRN
  Administered 2015-10-08 – 2015-10-12 (×3): 650 mg via ORAL
  Filled 2015-10-07 (×4): qty 2

## 2015-10-07 NOTE — ED Notes (Addendum)
[facility] notified that patient is ready for transport. Belongings returned from security.
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
## 2015-10-07 NOTE — ED Provider Notes (Signed)
Patient has been examined just prior to transfer to behavioral health Hospital.  She is not having any discomfort at this time.  She understands the reason for her transfer  Junius Creamer, NP 10/07/15 2124  Daleen Bo, MD 10/08/15 [phone]

## 2015-10-07 NOTE — ED Notes (Signed)
This EMT escorted pt to room c22.  Pt was ambulatory with use of metal quad leg cane.  She was stable at a slow gait. Pt was offered snack and drink.  Sitter accompanied pt to new room.

## 2015-10-07 NOTE — ED Notes (Signed)
Patient here with chest pain since early am, reports SSCP with no radiation, shortness of breath with same

## 2015-10-07 NOTE — ED Notes (Signed)
Paged Security to wand patient

## 2015-10-07 NOTE — ED Notes (Signed)
Made call to CM, per Ria Comment RN

## 2015-10-07 NOTE — Progress Notes (Signed)
Patient accepted at [facility], to Dr. Parke Poisson, room 400-1, arrival time - 21:30-22:30. Call report at [phone]. RN Narda Rutherford was informed.  Verlon Setting, [place] Disposition staff 10/07/2015 6:41 PM
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
## 2015-10-07 NOTE — ED Notes (Signed)
Ordered pt meds from pharmacy. 

## 2015-10-07 NOTE — ED Notes (Signed)
Went in to assess the patient, and she became tearful. Pt reports that she has had a bad year since her stroke and nothing seems to be going well. States she has no regular place of residence and no one to help her here in the area. Provider informed.

## 2015-10-07 NOTE — ED Notes (Signed)
Pt given paper scrubs to change into. Belongings bagged and placed outside the room. Valuables locked up.

## 2015-10-07 NOTE — ED Notes (Signed)
Phlebotomy at the bedside  

## 2015-10-07 NOTE — ED Notes (Signed)
Pt wanded by security. 

## 2015-10-07 NOTE — Progress Notes (Signed)
Patient meets criteria for inpatient treatment program, per Agustina Caroli, NP.  Patient was referred for psychiatric inpatient treatment at: [facility] - per [facility], has beds. [facility] - per [place], beds open. [facility] - per Marlou Sa, has beds.  At capacity: [place] will continue to seek placement.  Verlon Setting, [place] Disposition staff 10/07/2015 6:16 PM
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
## 2015-10-08 ENCOUNTER — Encounter (HOSPITAL_COMMUNITY): Payer: Self-pay | Admitting: Psychiatry

## 2015-10-08 DIAGNOSIS — E1165 Type 2 diabetes mellitus with hyperglycemia: Secondary | ICD-10-CM

## 2015-10-08 DIAGNOSIS — F333 Major depressive disorder, recurrent, severe with psychotic symptoms: Principal | ICD-10-CM

## 2015-10-08 DIAGNOSIS — R45851 Suicidal ideations: Secondary | ICD-10-CM

## 2015-10-08 DIAGNOSIS — I10 Essential (primary) hypertension: Secondary | ICD-10-CM

## 2015-10-08 LAB — GLUCOSE, CAPILLARY
GLUCOSE-CAPILLARY: 194 mg/dL — AB (ref 65–99)
GLUCOSE-CAPILLARY: 77 mg/dL (ref 65–99)
GLUCOSE-CAPILLARY: 89 mg/dL (ref 65–99)

## 2015-10-08 MED ORDER — SERTRALINE HCL 50 MG PO TABS
50.0000 mg | ORAL_TABLET | Freq: Every day | ORAL | Status: DC
  Administered 2015-10-08 – 2015-10-11 (×4): 50 mg via ORAL
  Filled 2015-10-08 (×6): qty 1

## 2015-10-08 MED ORDER — POTASSIUM CHLORIDE CRYS ER 20 MEQ PO TBCR
20.0000 meq | EXTENDED_RELEASE_TABLET | Freq: Every day | ORAL | Status: DC
  Filled 2015-10-08 (×2): qty 1

## 2015-10-08 MED ORDER — INSULIN GLARGINE 100 UNIT/ML ~~LOC~~ SOLN
8.0000 [IU] | Freq: Every day | SUBCUTANEOUS | Status: DC
  Administered 2015-10-09 – 2015-10-13 (×5): 8 [IU] via SUBCUTANEOUS

## 2015-10-08 MED ORDER — POTASSIUM CHLORIDE CRYS ER 20 MEQ PO TBCR
20.0000 meq | EXTENDED_RELEASE_TABLET | Freq: Once | ORAL | Status: AC
  Administered 2015-10-08: 20 meq via ORAL
  Filled 2015-10-08 (×2): qty 1

## 2015-10-08 MED ORDER — ASPIRIN EC 325 MG PO TBEC
325.0000 mg | DELAYED_RELEASE_TABLET | Freq: Every day | ORAL | Status: DC
  Administered 2015-10-08 – 2015-10-21 (×14): 325 mg via ORAL
  Filled 2015-10-08 (×18): qty 1

## 2015-10-08 MED ORDER — ONDANSETRON 4 MG PO TBDP
4.0000 mg | ORAL_TABLET | Freq: Three times a day (TID) | ORAL | Status: DC | PRN
  Administered 2015-10-09 – 2015-10-15 (×5): 4 mg via ORAL
  Filled 2015-10-08 (×6): qty 1

## 2015-10-08 MED ORDER — ONDANSETRON 4 MG PO TBDP
ORAL_TABLET | ORAL | Status: AC
  Administered 2015-10-08: 4 mg
  Filled 2015-10-08: qty 1

## 2015-10-08 MED ORDER — ARIPIPRAZOLE 2 MG PO TABS
2.0000 mg | ORAL_TABLET | Freq: Every day | ORAL | Status: DC
  Administered 2015-10-08 – 2015-10-10 (×3): 2 mg via ORAL
  Filled 2015-10-08 (×4): qty 1

## 2015-10-08 NOTE — Consult Note (Addendum)
[facility] Medical Consultation  Naquita Holland [id number] DOB: 10/29/1970 DOA: 10/07/2015   PCP: Minerva Ends, MD    Requesting physician: Dr. Parke Poisson Date of consultation: 10/08/15 Reason for consultation: Hypertension  Chief Complaint: Nausea  HPI: Diamond Rice is a 45 y.o. female  with the past medical history of hypertension, stroke, depression, presented with complaints of suicidal thoughts and depression. She also reported auditory hallucinations. Patient was evaluated in the emergency department. She was cleared medically and was admitted to behavioral health for further management. Hospitalist was consulted for elevated blood pressures. Patient states that she has not taken her blood pressure medication in at least 1-2 weeks. Patient is vague while answering these questions. So her compliance is questionable. It is quite possible that she has not taken her medications in a long time. It is noted that she has been homeless recently. She complains of a headache. However, it has been ongoing chronically. She has a history of stroke with left-sided weakness which has not gotten any worse recently. Denies any chest pain currently. Has had some nausea.    Home Medications: Prior to Admission medications   Medication Sig Start Date End Date Taking? Authorizing Provider  acetaminophen (TYLENOL) 325 MG tablet Take 650 mg by mouth every 6 (six) hours as needed for headache.    Historical Provider, MD  ALPRAZolam Duanne Moron) 0.25 MG tablet Take 1 tablet (0.25 mg total) by mouth 3 (three) times daily as needed for anxiety. 09/25/14   Lavon Paganini Angiulli, PA-C  amLODipine (NORVASC) 10 MG tablet TAKE 1 TABLET BY MOUTH DAILY FOR HYPERTENSION 10/14/14   Boykin Nearing, MD  aspirin 325 MG tablet Take 1 tablet (325 mg total) by mouth daily. 09/25/14   Lavon Paganini Angiulli, PA-C  atorvastatin (LIPITOR) 40 MG tablet Take 1 tablet (40 mg total) by mouth daily at 6 PM. 10/14/14   Boykin Nearing,  MD  diclofenac sodium (VOLTAREN) 1 % GEL Apply 2 g topically 3 (three) times daily with meals. 10/14/14   Josalyn Funches, MD  glimepiride (AMARYL) 4 MG tablet Take 2 tablets (8 mg total) by mouth daily with breakfast. 10/14/14   Boykin Nearing, MD  glucose blood (TRUETEST TEST) test strip 1 each by Other route 3 (three) times daily. Use as instructed 10/14/14   Boykin Nearing, MD  hydrALAZINE (APRESOLINE) 10 MG tablet Take 1 tablet (10 mg total) by mouth 4 (four) times daily. 12/23/14   Josalyn Funches, MD  Insulin Glargine (TOUJEO SOLOSTAR) 300 UNIT/ML SOPN Inject 13 Units into the skin daily. 12/07/14   Josalyn Funches, MD  Insulin Pen Needle (B-D ULTRAFINE III SHORT PEN) 31G X 8 MM MISC 1 application by Does not apply route daily. 10/14/14   Josalyn Funches, MD  lisinopril-hydrochlorothiazide (ZESTORETIC) 20-12.5 MG per tablet Take 2 tablets by mouth daily. 10/14/14   Josalyn Funches, MD  mirtazapine (REMERON) 15 MG tablet Take 1 tablet (15 mg total) by mouth at bedtime. 12/07/14   Josalyn Funches, MD  pantoprazole (PROTONIX) 40 MG tablet Take 1 tablet (40 mg total) by mouth daily. 10/14/14   Josalyn Funches, MD  traMADol (ULTRAM) 50 MG tablet Take 1 tablet (50 mg total) by mouth 3 (three) times daily as needed for severe pain. 01/27/15   Josalyn Funches, MD  Vitamin D, Ergocalciferol, (DRISDOL) 50000 UNITS CAPS capsule Take 1 capsule (50,000 Units total) by mouth every 7 (seven) days. For 12 total weeks 01/06/15   Boykin Nearing, MD    Current Inpatient Medications:  Scheduled: . amLODipine  10 mg Oral Daily  . ARIPiprazole  2 mg Oral Daily  . atorvastatin  40 mg Oral q1800  . diclofenac sodium  2 g Topical TID WC  . hydrALAZINE  10 mg Oral QID  . lisinopril  40 mg Oral Daily   And  . hydrochlorothiazide  25 mg Oral Daily  . [START ON 10/09/2015] insulin glargine  8 Units Subcutaneous Daily  . pantoprazole  40 mg Oral Daily  . potassium chloride  20 mEq Oral Daily  . sertraline  50 mg Oral  Daily  . Vitamin D (Ergocalciferol)  50,000 Units Oral Q7 days   Continuous:  [id number], alum & mag hydroxide-simeth, magnesium hydroxide, ondansetron  Allergies:  Allergies  Allergen Reactions  . Morphine And Related Hives    Past Medical History: Past Medical History  Diagnosis Date  . Hypertension   . Anginal pain ([place])   . High cholesterol   . Shortness of breath 04/17/2012    "all the time"  . Headache(784.0) 04/17/2012    "~ qod; lately waking up in am w/one"  . Migraines 04/17/2012  . Chronic lower back pain 04/17/2012    "just got over some; catched when I walked"  . Depression   . Anxiety   . Sleep apnea   . Obesity   . Stroke ([place])   . Type II diabetes mellitus ([place]) 08/28/2002  . COPD (chronic obstructive pulmonary disease) [facility])     Past Surgical History  Procedure Laterality Date  . Cardiac catheterization  ~ 2011    Social History:  Social History   Social History  . Marital Status: Single    Spouse Name: N/A  . Number of Children: N/A  . Years of Education: N/A   Occupational History  . Not on file.   Social History Main Topics  . Smoking status: Current Every Day Smoker -- 0.50 packs/day for 10 years    Types: Cigarettes    Last Attempt to Quit: 12/07/2014  . Smokeless tobacco: Never Used  . Alcohol Use: No     Comment: rare  . Drug Use: Yes    Special: Marijuana, "Crack" cocaine  . Sexual Activity: Not Currently   Other Topics Concern  . 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     Family History:  Family History  Problem Relation Age of Onset  . Stroke Brother 73  . Stroke Brother 63  . Heart attack Mother 4  . Stroke Father 54     Review of Systems - History obtained from the patient General ROS: positive for  - fatigue Psychological ROS: positive for - depression Ophthalmic ROS: negative ENT ROS: negative Allergy and Immunology ROS:  negative Hematological and Lymphatic ROS: negative Endocrine ROS: negative Respiratory ROS: no cough, shortness of breath, or wheezing Cardiovascular ROS: no chest pain or dyspnea on exertion Gastrointestinal ROS: no abdominal pain, change in bowel habits, or black or bloody stools Genito-Urinary ROS: no dysuria, trouble voiding, or hematuria Musculoskeletal ROS: negative Neurological ROS: left sided weakness Dermatological ROS: negative  Physical Examination: Filed Vitals:   10/07/15 2246 10/08/15 0500 10/08/15 0629 10/08/15 0630  BP: 167/101  158/108 151/107  Pulse: 93  80 87  Temp:   98.4 F (36.9 C)   TempSrc:      Resp:   18   Height:  5\' 5"  (1.651 m)    Weight:  SpO2:        General appearance: alert, cooperative, appears stated age and no distress. Obese. Head: Normocephalic, without obvious abnormality, atraumatic Eyes: conjunctivae/corneas clear. PERRL, EOM's intact.  Neck: no adenopathy, no carotid bruit, no JVD, supple, symmetrical, trachea midline and thyroid not enlarged, symmetric, no tenderness/mass/nodules Resp: clear to auscultation bilaterally Cardio: regular rate and rhythm, S1, S2 normal, no murmur, click, rub or gallop GI: soft, non-tender; bowel sounds normal; no masses,  no organomegaly Extremities: extremities normal, atraumatic, no cyanosis or edema Pulses: 2+ and symmetric Skin: Skin color, texture, turgor normal. No rashes or lesions Lymph nodes: Cervical, supraclavicular, and axillary nodes normal. Neurologic: Left-sided weakness from previous stroke  Laboratory Data: Results for orders placed or performed during the hospital encounter of 10/07/15 (from the past 48 hour(s))  Glucose, capillary     Status: Abnormal   Collection Time: 10/08/15  6:23 AM  Result Value Ref Range   Glucose-Capillary 194 (H) 65 - 99 mg/dL  Glucose, capillary     Status: None   Collection Time: 10/08/15  4:58 PM  Result Value Ref Range   Glucose-Capillary 77 65 -  99 mg/dL   Comment 1 Notify RN    Comment 2 Document in Chart     Imaging Studies: Dg Chest 2 View  10/07/2015  CLINICAL DATA:  Chest pain EXAM: CHEST  2 VIEW COMPARISON:  09/08/2014 FINDINGS: Cardiac shadow is within normal limits. Minimal left midlung scarring is noted. No focal infiltrate or sizable effusion is seen. No bony abnormality is noted. IMPRESSION: No acute abnormality seen. Electronically Signed   By: Inez Catalina M.D.   On: 10/07/2015 10:42     Impression/Recommendations  Principal Problem:   MDD (major depressive disorder), recurrent, severe, with psychosis ([place])   This is a 45 year old African-American female with past medical history as stated earlier, who presented with the depression and suicidal thoughts and ideation. She was found to be positive for cocaine in her urine. She admits to using cocaine on a regular basis.  #1 Essential Hypertension: Blood pressure is likely elevated due to noncompliance. She has been started back on her antihypertensive regimen. Anticipate that this will take a few days to take effect. Blood pressures are not dangerously high. Continue to monitor as you are doing. Will not make any further changes to her blood pressure medication regimen. Potassium will be given. Considering her history of stroke, she should be on an antiplatelet agent. Recommend restarting her aspirin as there appears to be no contraindication.  #2 history of diabetes mellitus type 2: Patient was quite hyperglycemic last night. She was started back on her insulin regimen. Blood glucose level is 77 this afternoon. It is quite likely that she was not being compliant with her diabetic regimen as well. Due to potential for significant hypoglycemia we will cut back on the dose of her Lantus and stop her Amaryl for now. Monitor CBGs. Might benefit from checking HbA1c.  Other issues per primary team.  We don't have any further recommendations regarding the above 2 issues. Hence,  we will sign off. Please call us back if there are any further questions.  Thank you for this consultation.  [facility] Pager [phone]  If 7PM-7AM, please contact night-coverage.  [email or web address] Password [email or web address]  10/08/2015, 7:09 PM

## 2015-10-08 NOTE — Progress Notes (Signed)
NSG 7a-7p shift:   D:  Pt. Has been complaining of increased phlegm production and transient nausea this shift.  She has been depressed, but pleasant and cooperative.    A: Support, education, and encouragement provided as needed.  Level 3 checks continued for safety.  Extender called for an order for an anti-emetic after patient was dry-heaving at dinner time. Zofran odt administered per order.  R: Pt.  receptive to intervention/s.  Safety maintained.  Prudencio Pair, RN

## 2015-10-08 NOTE — Progress Notes (Signed)
Psychoeducational Group Note  Date:  10/08/2015 Time:  2152  Group Topic/Focus:  Wrap-Up Group:   The focus of this group is to help patients review their daily goal of treatment and discuss progress on daily workbooks.  Participation Level: Did Not Attend  Participation Quality:  Not Applicable  Affect:  Not Applicable  Cognitive:  Not Applicable  Insight:  Not Applicable  Engagement in Group: Not Applicable  Additional Comments:  The patient did not attend group and was asleep in her bedroom.   Archie Balboa S 10/08/2015, 9:52 PM
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
## 2015-10-08 NOTE — Progress Notes (Signed)
Patient alert and oriented x 4. Patient came to [facility] at 2215. This Probation officer preformed assessment. Patient was cooperative during assessment. Patient states she is a smoker, "I smoke about a half a pack a day." Patient refused tobacco cessation medications.  Patient states, "I have been smoking pot and using crack since I ran out of my medications. It helps me deal with stuff."  Patient has a history of a CVA with left sided weakness. Patient walks with a cane. Patient is somewhat unsteady when walking onto the unit. During skin assessment patient has a tattoo on upper right arm. Patient has multiple skin folds.  Patient denies pain/SI/HI/AVH. Patient states, "I am not having any suicide thoughts now since y'all took all my stuff, and I am not hearing anything anymore." Patient was oriented to unit and room. Patient is currently laying in bed.

## 2015-10-08 NOTE — BHH Group Notes (Signed)
[place] LCSW Group Therapy 10/08/2015  1:15 PM   Type of Therapy: Group Therapy  Participation Level: Did Not Attend. Patient invited to participate but declined.   Tilden Fossa, MSW, [place] Clinical Social Worker [facility] [phone]

## 2015-10-08 NOTE — Tx Team (Signed)
Interdisciplinary Treatment Plan Update (Adult) Date: 10/08/2015    Time Reviewed: 9:30 AM  Progress in Treatment: Attending groups: Continuing to assess, patient new to milieu Participating in groups: Continuing to assess, patient new to milieu Taking medication as prescribed: Yes Tolerating medication: Yes Family/Significant other contact made: No, CSW assessing for appropriate contacts Patient understands diagnosis: Yes Discussing patient identified problems/goals with staff: Yes Medical problems stabilized or resolved: Yes Denies suicidal/homicidal ideation: Yes Issues/concerns per patient self-inventory: Yes Other:  New problem(s) identified: N/A  Discharge Plan or Barriers: CSW continuing to assess, patient new to milieu.  Reason for Continuation of Hospitalization:  Depression Anxiety Medication Stabilization   Comments: N/A  Estimated length of stay: 3-5 days    Patient is a 45 year old female with a diagnosis of Major Depressive Disorder. Pt presented to the hospital with suicidal thoughts and substance abuse. Pt reports primary trigger(s) for admission were financial and housing stressors. Patient will benefit from crisis stabilization, medication evaluation, group therapy and psycho education in addition to case management for discharge planning. At discharge, it is recommended that Pt remain compliant with established discharge plan and continued treatment.   Review of initial/current patient goals per problem list:  1. Goal(s): Patient will participate in aftercare plan   Met: No   Target date: 3-5 days post admission date   As evidenced by: Patient will participate within aftercare plan AEB aftercare provider and housing plan at discharge being identified.  2/10: Goal not met: CSW assessing for appropriate referrals for pt and will have follow up secured prior to d/c.    2. Goal (s): Patient will exhibit decreased depressive symptoms and suicidal  ideations.   Met: No   Target date: 3-5 days post admission date   As evidenced by: Patient will utilize self rating of depression at 3 or below and demonstrate decreased signs of depression or be deemed stable for discharge by MD.  2/10: Goal not met: Pt presents with flat affect and depressed mood.  Pt admitted with depression rating of 10.  Pt to show decreased sign of depression and a rating of 3 or less before d/c.       3. Goal(s): Patient will demonstrate decreased signs and symptoms of anxiety.   Met: No   Target date: 3-5 days post admission date   As evidenced by: Patient will utilize self rating of anxiety at 3 or below and demonstrated decreased signs of anxiety, or be deemed stable for discharge by MD   2/10: Goal not met: Pt presents with anxious mood and affect.  Pt admitted with anxiety rating of 10.  Pt to show decreased sign of anxiety and a rating of 3 or less before d/c.    4. Goal(s): Patient will demonstrate decreased signs of withdrawal due to substance abuse   Met: Yes   Target date: 3-5 days post admission date   As evidenced by: Patient will produce a CIWA/COWS score of 0, have stable vitals signs, and no symptoms of withdrawal  2/10: Goal met. No withdrawal symptoms reported at this time per medical chart.      Attendees: Patient:    Family:    Physician: Dr. Jama Flavors; Dr. Dub Mikes 10/08/2015 9:30 AM  Nursing: Madelyn Brunner, Rosine Abe RN 10/08/2015 9:30 AM  Clinical Social Worker: Belenda Cruise Maggi Hershkowitz, LCSW 10/08/2015 9:30 AM  Other: Chad Cordial, LCSWA; Heather Smart, LCSW  10/08/2015 9:30 AM  Other: Tomasita Morrow, P4CC 10/08/2015 9:30 AM  Other:  10/08/2015  9:30 AM  Other: May Malachi Carl, NP 10/08/2015 9:30 AM  Other:          Scribe for Treatment Team:  Tilden Fossa, [place]
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
## 2015-10-08 NOTE — [facility] Suicide Risk Assessment (Signed)
[name] INPATIENT:  Family/Significant Other Suicide Prevention Education  Suicide Prevention Education:  Patient Refusal for Family/Significant Other Suicide Prevention Education: The patient Diamond Rice has refused to provide written consent for family/significant other to be provided Family/Significant Other Suicide Prevention Education during admission and/or prior to discharge.  Physician notified. SPE reviewed with patient and brochure provided. Patient encouraged to return to hospital if having suicidal thoughts, patient verbalized his/her understanding and has no further questions at this time.   Kenny Rea, Casimiro Needle 10/08/2015, 5:39 PM

## 2015-10-08 NOTE — [facility] Group Notes (Signed)
[facility] LCSW Aftercare Discharge Planning Group Note  10/08/2015 8:45 AM  Pt attended group but left early while gagging.   Peri Maris, [place] 10/08/2015 9:53 AM

## 2015-10-09 LAB — GLUCOSE, CAPILLARY
GLUCOSE-CAPILLARY: 80 mg/dL (ref 65–99)
GLUCOSE-CAPILLARY: 171 mg/dL — AB (ref 65–99)
GLUCOSE-CAPILLARY: 159 mg/dL — AB (ref 65–99)

## 2015-10-09 MED ORDER — TRAZODONE HCL 50 MG PO TABS
50.0000 mg | ORAL_TABLET | Freq: Every evening | ORAL | Status: DC | PRN
  Administered 2015-10-09 – 2015-10-11 (×3): 50 mg via ORAL
  Filled 2015-10-09 (×12): qty 1

## 2015-10-09 NOTE — [facility] Group Notes (Signed)
[facility] LCSW Group Therapy  10/09/2015  10:30 AM to 11:30 PM  Type of Therapy:  Group Therapy  Participation Level:  Minimal  Participation Quality:  Attentive  Affect:  Hesitant  Cognitive: Alert and oriented   Insight:  Limited  Engagement in Therapy:  Minimal  Modes of Intervention:  Discussion, Exploration, Problem-solving, Rapport Building, Socialization and Support   Summary of Progress/Problems: The main focus of today's process group was for the patient to identify ways in which they have in the past sabotaged their own recovery. Motivational Interviewing was utilized to ask the group members what they get out of their self sabotaging behaviors, and what reasons they may have for wanting to change. The Stages of Change were explained using a handout, and patients identified where they currently are with regard to stages of change. Diamond Rice came into group late after meeting with medical staff. She appeared hesitant to interact/share as evidenced by covering her face when facilitator would look at her. Answers to direct questions were one word answers and spoken softly. Patient shared that she isolates and is contemplating change.   [name], LCSW

## 2015-10-09 NOTE — Progress Notes (Signed)
[facility] Group Notes:  (Nursing/MHT/Case Management/Adjunct)  Date:  10/09/2015  Time:  10:09 PM  Type of Therapy:  Psychoeducational Skills  Participation Level:  None  Participation Quality:  Resistant  Affect:  Depressed and Flat  Cognitive:  Lacking  Insight:  None  Engagement in Group:  None  Modes of Intervention:  Education  Summary of Progress/Problems: The patient refused to share with the group this evening.   Archie Balboa S 10/09/2015, 10:09 PM

## 2015-10-09 NOTE — Progress Notes (Signed)
Patient was seen in her room lying down asleep but opens her eyes as soon as this Probation officer called her name. Responded to the greetings and stated "I'm fine. Just tired. Want to get some good sleep". Denies pain, SI, AH/VH at this time. Reported that she vomited during the day and is hungry now. Requested for a food. Patient accepted sandwich offered. Accepted her bed time medications. Support and encouragement offered as needed. No behavioral issues noted. Will continue to monitor patient for safety and stability.

## 2015-10-09 NOTE — Progress Notes (Signed)
Nursing Progress Note: 7-7p  D- Mood is depressed and anxious,. Affect is blunted and appropriate. Pt is able to contract for safety. Pt has been self isolating, tearful after a phone call. Refused to discuss it. Continues to have difficulty staying asleep.  A - Observed pt  not interacting in group and in the milieu.Support and encouragement offered, safety maintained with q 15 minutes. Group discussion included healthy coping skills..Pt vomited small amount of sputum after eating hamburger .  R-Contracts for safety and continues to follow treatment plan, working on learning new coping skills.

## 2015-10-09 NOTE — Progress Notes (Signed)
Patient up and about tonight. More cheerful and socializing with peers on day room. Patient stated that she feels better today than any other days. Denies pain, SI, AH/VH at this time. Accepted her bedtime medications. Made no new complaint. Offered encouragement to patient. Safety maintained. Will continue to monitor patient for safety and stability.

## 2015-10-09 NOTE — Progress Notes (Signed)
Adult Psychoeducational Group Note  Date:  10/09/2015 Time:  9:00 am  Group Topic/Focus:  Making Healthy Choices:   The focus of this group is to help patients identify negative/unhealthy choices they were using prior to admission and identify positive/healthier coping strategies to replace them upon discharge.  Participation Level:  Minimal  Participation Quality:  Resistant  Affect:  Anxious and Blunted  Cognitive:  Alert and Appropriate  Insight: Lacking and Limited  Engagement in Group:  Lacking and Limited  Modes of Intervention:  Discussion and Education  Additional Comments: Group discussed healthy coping skills. Pt has difficulty coming up with an answer after much prompting enjoys sleeping.   Jaynie Bream 10/09/2015, 9:27 AM

## 2015-10-09 NOTE — Progress Notes (Signed)
[facility] MD Progress Note  10/09/2015 10:24 AM Diamond Rice  MRN:  [id number] Subjective:  Patient reports " I am sad and depressed, I just don't feel like living"   Objective:Diamond Rice is awake, alert and oriented X3, found attending group session. Reports suicidal ideation states that the thoughts are off and on. Patient dose contract for safety while on the unit. Patient denies homicidal ideation. Denies auditory or visual hallucination and does not appear to be responding to internal stimuli. Patient interacts well with staff and others. Patient reports she is medication compliant without mediation side effects.  States her depression 10/10. Patient states "I have not been feeling the same since my stroke 1 year ago."  Reports good appetite and reports she is not  resting well throughout the night. Support, encouragement and reassurance was provided.   Principal Problem: MDD (major depressive disorder), recurrent, severe, with psychosis ([place]) Diagnosis:   Patient Active Problem List   Diagnosis Date Noted  . MDD (major depressive disorder), recurrent, severe, with psychosis ([place]) [F33.3] 10/07/2015  . OSA (obstructive sleep apnea) [G47.33] 06/09/2015  . Vitamin D deficiency [E55.9] 01/06/2015  . Bilateral knee pain [M25.561, M25.562] 01/05/2015  . Numbness and tingling in right hand [R20.2] 01/05/2015  . Insomnia [G47.00] 12/07/2014  . Right-sided lacunar infarction ([place]) [I63.9] 09/15/2014  . Left hemiparesis ([place]) [G81.94] 09/15/2014  . Dyspnea [R06.00]   . Back pain [M54.9] 09/11/2013  . Psychoactive substance-induced organic mood disorder ([place]) [id number], F06.30] 05/28/2013  . Cocaine abuse with cocaine-induced mood disorder ([place]) [F14.14] 05/28/2013  . Cannabis dependence with cannabis-induced anxiety disorder ([place]) [F12.280] 05/28/2013  . Morbid obesity with BMI of 45.0-49.9, adult ([place]) [E66.01, Z68.42] 04/25/2012  . Chest pain [R07.9] 04/17/2012  . Hypertension [I10]  04/17/2012  . Diabetes mellitus type 2 with complications, uncontrolled ([place]) [E11.8, E11.65] 04/17/2012  . Hyperlipidemia [E78.5] 04/17/2012  . Tobacco use [Z72.0] 04/17/2012   Total Time spent with patient: 45 minutes  Past Psychiatric History: SEE ABOVE  Past Medical History:  Past Medical History  Diagnosis Date  . Hypertension   . Anginal pain ([place])   . High cholesterol   . Shortness of breath 04/17/2012    "all the time"  . Headache(784.0) 04/17/2012    "~ qod; lately waking up in am w/one"  . Migraines 04/17/2012  . Chronic lower back pain 04/17/2012    "just got over some; catched when I walked"  . Depression   . Anxiety   . Sleep apnea   . Obesity   . Stroke ([place])   . Type II diabetes mellitus ([place]) 08/28/2002  . COPD (chronic obstructive pulmonary disease) [facility])     Past Surgical History  Procedure Laterality Date  . Cardiac catheterization  ~ 2011   Family History:  Family History  Problem Relation Age of Onset  . Stroke Brother 20  . Stroke Brother 59  . Heart attack Mother 54  . Stroke Father 81   Family Psychiatric  History: SEE Above Social History:  History  Alcohol Use No    Comment: rare     History  Drug Use  . Yes  . Special: Marijuana, "Crack" cocaine    Social History   Social History  . Marital Status: Single    Spouse Name: N/A  . Number of Children: N/A  . Years of Education: N/A   Social History Main Topics  . Smoking status: Current Every Day Smoker -- 0.50 packs/day for 10 years  Types: Cigarettes    Last Attempt to Quit: 12/07/2014  . Smokeless tobacco: Never Used  . Alcohol Use: No     Comment: rare  . Drug Use: Yes    Special: Marijuana, "Crack" cocaine  . Sexual Activity: Not Currently   Other Topics Concern  . 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   Additional Social History:                         Sleep: Poor  Appetite:  Fair  Current  Medications: Current Facility-Administered Medications  Medication Dose Route Frequency Provider Last Rate Last Dose  . acetaminophen (TYLENOL) tablet 650 mg  650 mg Oral Q6H PRN Harriet Butte, NP   650 mg at 10/08/15 1712  . alum & mag hydroxide-simeth (MAALOX/MYLANTA) 200-200-20 MG/5ML suspension 30 mL  30 mL Oral Q4H PRN Harriet Butte, NP      . amLODipine (NORVASC) tablet 10 mg  10 mg Oral Daily Harriet Butte, NP   10 mg at 10/09/15 0808  . ARIPiprazole (ABILIFY) tablet 2 mg  2 mg Oral Daily Jenne Campus, MD   2 mg at 10/09/15 0809  . aspirin EC tablet 325 mg  325 mg Oral Daily Bonnielee Haff, MD   325 mg at 10/09/15 0809  . atorvastatin (LIPITOR) tablet 40 mg  40 mg Oral q1800 Harriet Butte, NP   40 mg at 10/08/15 2033  . diclofenac sodium (VOLTAREN) 1 % transdermal gel 2 g  2 g Topical TID WC Harriet Butte, NP   2 g at 10/09/15 0811  . hydrALAZINE (APRESOLINE) tablet 10 mg  10 mg Oral QID Harriet Butte, NP   10 mg at 10/09/15 [id number]  . lisinopril (PRINIVIL,ZESTRIL) tablet 40 mg  40 mg Oral Daily Jenne Campus, MD   40 mg at 10/09/15 0806   And  . hydrochlorothiazide (HYDRODIURIL) tablet 25 mg  25 mg Oral Daily Jenne Campus, MD   25 mg at 10/09/15 0807  . insulin glargine (LANTUS) injection 8 Units  8 Units Subcutaneous Daily Bonnielee Haff, MD   8 Units at 10/09/15 1018  . magnesium hydroxide (MILK OF MAGNESIA) suspension 30 mL  30 mL Oral Daily PRN Harriet Butte, NP      . ondansetron (ZOFRAN-ODT) disintegrating tablet 4 mg  4 mg Oral Q8H PRN Kerrie Buffalo, NP      . pantoprazole (PROTONIX) EC tablet 40 mg  40 mg Oral Daily Harriet Butte, NP   40 mg at 10/09/15 0808  . sertraline (ZOLOFT) tablet 50 mg  50 mg Oral Daily Jenne Campus, MD   50 mg at 10/09/15 0807  . Vitamin D (Ergocalciferol) (DRISDOL) capsule 50,000 Units  50,000 Units Oral Q7 days Harriet Butte, NP   50,000 Units at 10/08/15 1333    Lab Results:  Results for orders placed or performed  during the hospital encounter of 10/07/15 (from the past 48 hour(s))  Glucose, capillary     Status: Abnormal   Collection Time: 10/08/15  6:23 AM  Result Value Ref Range   Glucose-Capillary 194 (H) 65 - 99 mg/dL  Glucose, capillary     Status: None   Collection Time: 10/08/15  4:58 PM  Result Value Ref Range   Glucose-Capillary 77 65 - 99 mg/dL   Comment 1 Notify RN  Comment 2 Document in Chart   Glucose, capillary     Status: None   Collection Time: 10/08/15  8:39 PM  Result Value Ref Range   Glucose-Capillary 89 65 - 99 mg/dL   Comment 1 Notify RN   Glucose, capillary     Status: None   Collection Time: 10/09/15  5:48 AM  Result Value Ref Range   Glucose-Capillary 80 65 - 99 mg/dL    Physical Findings: AIMS: Facial and Oral Movements Muscles of Facial Expression: None, normal Lips and Perioral Area: None, normal Jaw: None, normal Tongue: None, normal,Extremity Movements Upper (arms, wrists, hands, fingers): None, normal Lower (legs, knees, ankles, toes): None, normal, Trunk Movements Neck, shoulders, hips: None, normal, Overall Severity Severity of abnormal movements (highest score from questions above): None, normal Incapacitation due to abnormal movements: None, normal Patient's awareness of abnormal movements (rate only patient's report): No Awareness, Dental Status Current problems with teeth and/or dentures?: No Does patient usually wear dentures?: No  CIWA:  CIWA-Ar Total: 0 COWS:     Musculoskeletal: Strength & Muscle Tone: within normal limits Gait & Station: normal patient ambulates with a walker. Patient leans: N/A  Psychiatric Specialty Exam: Review of Systems  Psychiatric/Behavioral: Positive for depression and suicidal ideas. The patient is nervous/anxious and has insomnia.   All other systems reviewed and are negative.   Blood pressure 156/94, pulse 92, temperature 98.4 F (36.9 C), temperature source Oral, resp. rate 18, height 5\' 5"  (1.651 m),  weight 113.853 kg (251 lb), last menstrual period 10/03/2015, SpO2 99 %.Body mass index is 41.77 kg/(m^2).  General Appearance: Guarded  Eye Contact::  Good  Speech:  Clear and Coherent  Volume:  Normal  Mood:  Anxious, Depressed and Hopeless  Affect:  Congruent  Thought Process:  Coherent  Orientation:  Full (Time, Place, and Person)  Thought Content:  Hallucinations: None  Suicidal Thoughts:  Yes.  with intent/plan patient contracts for safety on the unit  Homicidal Thoughts:  No  Memory:  Immediate;   Fair Recent;   Fair Remote;   Fair  Judgement:  Fair  Insight:  Fair  Psychomotor Activity:  Restlessness  Concentration:  Fair  Recall:  [employer] of Knowledge:Fair  Language: Good  Akathisia:  NA  Handed:  Right  AIMS (if indicated):     Assets:  Communication Skills Desire for Improvement Financial Resources/Insurance Social Support  ADL's:  Intact  Cognition: WNL  Sleep:  Number of Hours: 6.75     I agree with current treatment plan on 10/09/2015, Patient seen face-to-face for psychiatric evaluation follow-up, chart reviewed. Reviewed the information documented and agree with the treatment plan.  Treatment Plan Summary: Daily contact with patient to assess and evaluate symptoms and progress in treatment and Medication management Continue Zoloft 50 mgrs QDAY for depression and anxiety- prefers to stop Remeron due to excessive sedation. Continue Abilify as augmentation and consider gradual titration as tolerated  Start Trazodone 50 mg with 1X repeat dose for insomnia   Derrill [place], NP 10/09/2015, 10:24 AM

## 2015-10-10 LAB — GLUCOSE, CAPILLARY
GLUCOSE-CAPILLARY: 212 mg/dL — AB (ref 65–99)
Glucose-Capillary: 136 mg/dL — ABNORMAL HIGH (ref 65–99)

## 2015-10-10 MED ORDER — CYCLOBENZAPRINE HCL 10 MG PO TABS
5.0000 mg | ORAL_TABLET | Freq: Three times a day (TID) | ORAL | Status: DC | PRN
  Administered 2015-10-10 – 2015-10-19 (×10): 5 mg via ORAL
  Filled 2015-10-10 (×8): qty 1

## 2015-10-10 MED ORDER — ARIPIPRAZOLE 5 MG PO TABS
5.0000 mg | ORAL_TABLET | Freq: Every day | ORAL | Status: DC
  Administered 2015-10-11 – 2015-10-14 (×4): 5 mg via ORAL
  Filled 2015-10-10 (×5): qty 1

## 2015-10-10 NOTE — Progress Notes (Signed)
Psychoeducational Group Note  Date:  10/10/2015 Time:  2159  Group Topic/Focus:  Wrap-Up Group:   The focus of this group is to help patients review their daily goal of treatment and discuss progress on daily workbooks.  Participation Level: Did Not Attend  Participation Quality:  Not Applicable  Affect:  Not Applicable  Cognitive:  Not Applicable  Insight:  Not Applicable  Engagement in Group: Not Applicable  Additional Comments:  The patient did not attend group since she was asleep in her bedroom.   Archie Balboa S 10/10/2015, 9:59 PM

## 2015-10-10 NOTE — Progress Notes (Signed)
[facility] MD Progress Note  10/10/2015 11:22 AM Diamond Rice  MRN:  [id number] Subjective:  Patient reports " I am feeling okay, I am just having back pain"   Objective:Diamond Rice is awake, alert and oriented X3, found resting in bed. Reports suicidal ideation that are still  off and on. Patient dose contract for safety while on the unit. Patient denies homicidal ideations.  Denies auditory or visual hallucination and does not appear to be responding to internal stimuli. Patient interacts well with staff and others state that she attended more groups on yesterday. Patient reports she is medication compliant without mediation side effects.  States her depression 10/10. Patient states "I am having a good day, I am just having back pain 10/10."  Reports good appetite and reports she had a better night. However here back spasms may have keep her up. Support, encouragement and reassurance was provided.   Principal Problem: MDD (major depressive disorder), recurrent, severe, with psychosis ([place]) Diagnosis:   Patient Active Problem List   Diagnosis Date Noted  . MDD (major depressive disorder), recurrent, severe, with psychosis ([place]) [F33.3] 10/07/2015  . OSA (obstructive sleep apnea) [G47.33] 06/09/2015  . Vitamin D deficiency [E55.9] 01/06/2015  . Bilateral knee pain [M25.561, M25.562] 01/05/2015  . Numbness and tingling in right hand [R20.2] 01/05/2015  . Insomnia [G47.00] 12/07/2014  . Right-sided lacunar infarction ([place]) [I63.9] 09/15/2014  . Left hemiparesis ([place]) [G81.94] 09/15/2014  . Dyspnea [R06.00]   . Back pain [M54.9] 09/11/2013  . Psychoactive substance-induced organic mood disorder ([place]) [id number], F06.30] 05/28/2013  . Cocaine abuse with cocaine-induced mood disorder ([place]) [F14.14] 05/28/2013  . Cannabis dependence with cannabis-induced anxiety disorder ([place]) [F12.280] 05/28/2013  . Morbid obesity with BMI of 45.0-49.9, adult ([place]) [E66.01, Z68.42] 04/25/2012  . Chest pain  [R07.9] 04/17/2012  . Hypertension [I10] 04/17/2012  . Diabetes mellitus type 2 with complications, uncontrolled ([place]) [E11.8, E11.65] 04/17/2012  . Hyperlipidemia [E78.5] 04/17/2012  . Tobacco use [Z72.0] 04/17/2012   Total Time spent with patient: 30 minutes  Past Psychiatric History: SEE ABOVE  Past Medical History:  Past Medical History  Diagnosis Date  . Hypertension   . Anginal pain ([place])   . High cholesterol   . Shortness of breath 04/17/2012    "all the time"  . Headache(784.0) 04/17/2012    "~ qod; lately waking up in am w/one"  . Migraines 04/17/2012  . Chronic lower back pain 04/17/2012    "just got over some; catched when I walked"  . Depression   . Anxiety   . Sleep apnea   . Obesity   . Stroke ([place])   . Type II diabetes mellitus ([place]) 08/28/2002  . COPD (chronic obstructive pulmonary disease) [facility])     Past Surgical History  Procedure Laterality Date  . Cardiac catheterization  ~ 2011   Family History:  Family History  Problem Relation Age of Onset  . Stroke Brother 22  . Stroke Brother 43  . Heart attack Mother 64  . Stroke Father 2   Family Psychiatric  History: SEE Above Social History:  History  Alcohol Use No    Comment: rare     History  Drug Use  . Yes  . Special: Marijuana, "Crack" cocaine    Social History   Social History  . Marital Status: Single    Spouse Name: N/A  . Number of Children: N/A  . Years of Education: N/A   Social History Main Topics  . Smoking status: Current  Every Day Smoker -- 0.50 packs/day for 10 years    Types: Cigarettes    Last Attempt to Quit: 12/07/2014  . Smokeless tobacco: Never Used  . Alcohol Use: No     Comment: rare  . Drug Use: Yes    Special: Marijuana, "Crack" cocaine  . Sexual Activity: Not Currently   Other Topics Concern  . 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   Additional Social History:                          Sleep: Poor  Appetite:  Fair  Current Medications: Current Facility-Administered Medications  Medication Dose Route Frequency Provider Last Rate Last Dose  . acetaminophen (TYLENOL) tablet 650 mg  650 mg Oral Q6H PRN Harriet Butte, NP   650 mg at 10/09/15 1707  . alum & mag hydroxide-simeth (MAALOX/MYLANTA) 200-200-20 MG/5ML suspension 30 mL  30 mL Oral Q4H PRN Harriet Butte, NP      . amLODipine (NORVASC) tablet 10 mg  10 mg Oral Daily Harriet Butte, NP   10 mg at 10/10/15 0903  . ARIPiprazole (ABILIFY) tablet 2 mg  2 mg Oral Daily Jenne Campus, MD   2 mg at 10/10/15 0904  . aspirin EC tablet 325 mg  325 mg Oral Daily Bonnielee Haff, MD   325 mg at 10/10/15 0903  . atorvastatin (LIPITOR) tablet 40 mg  40 mg Oral q1800 Harriet Butte, NP   40 mg at 10/09/15 1717  . diclofenac sodium (VOLTAREN) 1 % transdermal gel 2 g  2 g Topical TID WC Harriet Butte, NP   2 g at 10/10/15 0903  . hydrALAZINE (APRESOLINE) tablet 10 mg  10 mg Oral QID Harriet Butte, NP   10 mg at 10/10/15 0903  . lisinopril (PRINIVIL,ZESTRIL) tablet 40 mg  40 mg Oral Daily Jenne Campus, MD   40 mg at 10/10/15 [id number]   And  . hydrochlorothiazide (HYDRODIURIL) tablet 25 mg  25 mg Oral Daily Jenne Campus, MD   25 mg at 10/10/15 0903  . insulin glargine (LANTUS) injection 8 Units  8 Units Subcutaneous Daily Bonnielee Haff, MD   8 Units at 10/10/15 0902  . magnesium hydroxide (MILK OF MAGNESIA) suspension 30 mL  30 mL Oral Daily PRN Harriet Butte, NP      . ondansetron (ZOFRAN-ODT) disintegrating tablet 4 mg  4 mg Oral Q8H PRN Kerrie Buffalo, NP   4 mg at 10/09/15 1313  . pantoprazole (PROTONIX) EC tablet 40 mg  40 mg Oral Daily Harriet Butte, NP   40 mg at 10/10/15 0903  . sertraline (ZOLOFT) tablet 50 mg  50 mg Oral Daily Jenne Campus, MD   50 mg at 10/10/15 0903  . traZODone (DESYREL) tablet 50 mg  50 mg Oral QHS,MR X 1 Derrill Center, NP   50 mg at 10/09/15 2144  . Vitamin D (Ergocalciferol)  (DRISDOL) capsule 50,000 Units  50,000 Units Oral Q7 days Harriet Butte, NP   50,000 Units at 10/08/15 1333    Lab Results:  Results for orders placed or performed during the hospital encounter of 10/07/15 (from the past 48 hour(s))  Glucose, capillary     Status: None   Collection Time: 10/08/15  4:58 PM  Result Value Ref Range   Glucose-Capillary 77 65 - 99 mg/dL  Comment 1 Notify RN    Comment 2 Document in Chart   Glucose, capillary     Status: None   Collection Time: 10/08/15  8:39 PM  Result Value Ref Range   Glucose-Capillary 89 65 - 99 mg/dL   Comment 1 Notify RN   Glucose, capillary     Status: None   Collection Time: 10/09/15  5:48 AM  Result Value Ref Range   Glucose-Capillary 80 65 - 99 mg/dL  Glucose, capillary     Status: Abnormal   Collection Time: 10/09/15 10:12 AM  Result Value Ref Range   Glucose-Capillary 171 (H) 65 - 99 mg/dL  Glucose, capillary     Status: Abnormal   Collection Time: 10/09/15  8:52 PM  Result Value Ref Range   Glucose-Capillary 159 (H) 65 - 99 mg/dL   Comment 1 Notify RN   Glucose, capillary     Status: Abnormal   Collection Time: 10/10/15  6:03 AM  Result Value Ref Range   Glucose-Capillary 136 (H) 65 - 99 mg/dL    Physical Findings: AIMS: Facial and Oral Movements Muscles of Facial Expression: None, normal Lips and Perioral Area: None, normal Jaw: None, normal Tongue: None, normal,Extremity Movements Upper (arms, wrists, hands, fingers): None, normal Lower (legs, knees, ankles, toes): None, normal, Trunk Movements Neck, shoulders, hips: None, normal, Overall Severity Severity of abnormal movements (highest score from questions above): None, normal Incapacitation due to abnormal movements: None, normal Patient's awareness of abnormal movements (rate only patient's report): No Awareness, Dental Status Current problems with teeth and/or dentures?: No Does patient usually wear dentures?: No  CIWA:  CIWA-Ar Total: 0 COWS:      Musculoskeletal: Strength & Muscle Tone: within normal limits Gait & Station: normal patient ambulates with a walker. Patient leans: N/A  Psychiatric Specialty Exam: Review of Systems  Psychiatric/Behavioral: Positive for depression and suicidal ideas. The patient is nervous/anxious and has insomnia.   All other systems reviewed and are negative.   Blood pressure 146/90, pulse 97, temperature 98.2 F (36.8 C), temperature source Oral, resp. rate 16, height 5\' 5"  (1.651 m), weight 113.853 kg (251 lb), last menstrual period 10/03/2015, SpO2 99 %.Body mass index is 41.77 kg/(m^2).  General Appearance: Casual, pleasant and cooperative  Eye Contact::  Good  Speech:  Clear and Coherent  Volume:  Normal  Mood:  Anxious, Depressed and Hopeless  Affect:  Congruent  Thought Process:  Coherent  Orientation:  Full (Time, Place, and Person)  Thought Content:  Hallucinations: None  Suicidal Thoughts:  Yes.  with intent/plan patient contracts for safety on the unit  Homicidal Thoughts:  No  Memory:  Immediate;   Fair Recent;   Fair Remote;   Fair  Judgement:  Fair  Insight:  Fair  Psychomotor Activity:  Restlessness  Concentration:  Fair  Recall:  [employer] of Knowledge:Fair  Language: Good  Akathisia:  NA  Handed:  Right  AIMS (if indicated):     Assets:  Communication Skills Desire for Improvement Financial Resources/Insurance Social Support  ADL's:  Intact  Cognition: WNL  Sleep:  Number of Hours: 6.75    I agree with current treatment plan on 10/10/2015, Patient seen face-to-face for psychiatric evaluation follow-up, chart reviewed. Reviewed the information documented and agree with the treatment plan.  Treatment Plan Summary: Daily contact with patient to assess and evaluate symptoms and progress in treatment and Medication management Continue Zoloft 50 mgrs QDAY for depression and anxiety- prefers to stop Remeron due to excessive  sedation. Increased Abilify to 5 mg PO Q  day for mood stabilization. augmentation and consider gradual titration as tolerated  Contiune Trazodone 50 mg with 1X repeat dose for insomnia -improving Start Flexeril 5mg  PO TID PRN for back spasms.  Derrill Center, NP 10/10/2015, 11:22 AM

## 2015-10-10 NOTE — [facility] Group Notes (Signed)
   [place] LCSW Group Therapy Note   10/10/2015 10:45 AM   Type of Therapy and Topic: Group Therapy: Feelings Around Returning Home & Establishing a Supportive Framework and Activity to Identify signs of Improvement or Decompensation   Participation Level:  Did not attend; was encouraged to do so    [name], LCSW

## 2015-10-10 NOTE — [facility] Group Notes (Signed)
[facility] Group Notes: (Nursing/MHT/Case Management/Adjunct)  Date: 10/10/2015  Time: 10:14 AM  Type of Therapy: Psychoeducational Skills  Participation Level: Did Not Attend  Participation Quality: N/A  Affect: N/A  Cognitive: N/A  Insight: None  Engagement in Group: None  Modes of Intervention: Discussion and Education  Summary of Progress/Problems: Patient was invited but did not attend group.   Tyiana Hill E 10/10/2015, 10:14 AM           

## 2015-10-10 NOTE — Plan of Care (Signed)
Problem: Diagnosis: Increased Risk For Suicide Attempt Goal: STG-Patient Will Comply With Medication Regime Outcome: Progressing Patient is compliant with her medication regimen

## 2015-10-10 NOTE — Progress Notes (Signed)
Patient ID: Diamond Rice, female   DOB: 1971/04/09, 45 y.o.   MRN: [id number]  DAR: Pt. Denies HI and A/V Hallucinations. She reports passive SI but is able to contract for safety. She reports sleep is fair, appetite is fair, energy level is low, and concentration is poor. She rates depression, anxiety, and hopelessness 10/10. Patient does report pain and is receiving scheduled and PRN medication for this. Support and encouragement provided to the patient. Patient is isolative, staying in her room throughout the day mainly sleeping. Scheduled medications administered to patient per physician's orders. Q15 minute checks are maintained for safety.

## 2015-10-10 NOTE — Progress Notes (Signed)
Patient spent most of shift lying in bed. Out for medications and snacks. Refused to attend to group meeting. Denies pain, SI, AH/VH at this time but verbalizes depression. Patient was encouraged to stay out of bed and socialize with peers. Accepted her bed time medications. No further complaints. Every 15 minutes check for safety maintained. Will continue to monitor patient for safety and stability.

## 2015-10-11 LAB — BASIC METABOLIC PANEL
ANION GAP: 9 (ref 5–15)
BUN: 12 mg/dL (ref 6–20)
CALCIUM: 9.1 mg/dL (ref 8.9–10.3)
CO2: 30 mmol/L (ref 22–32)
CREATININE: 1.23 mg/dL — AB (ref 0.44–1.00)
Chloride: 97 mmol/L — ABNORMAL LOW (ref 101–111)
GFR, EST NON AFRICAN AMERICAN: 53 mL/min — AB (ref >60)
Glucose, Bld: 311 mg/dL — ABNORMAL HIGH (ref 65–99)
Potassium: 3 mmol/L — ABNORMAL LOW (ref 3.5–5.1)
SODIUM: 136 mmol/L (ref 135–145)

## 2015-10-11 LAB — HEMOGLOBIN A1C
Hgb A1c MFr Bld: 12.3 % — ABNORMAL HIGH (ref 4.8–5.6)
Mean Plasma Glucose: 306 mg/dL

## 2015-10-11 LAB — GLUCOSE, CAPILLARY
GLUCOSE-CAPILLARY: 287 mg/dL — AB (ref 65–99)
GLUCOSE-CAPILLARY: 221 mg/dL — AB (ref 65–99)
Glucose-Capillary: 257 mg/dL — ABNORMAL HIGH (ref 65–99)
Glucose-Capillary: 180 mg/dL — ABNORMAL HIGH (ref 65–99)

## 2015-10-11 MED ORDER — SERTRALINE HCL 100 MG PO TABS
100.0000 mg | ORAL_TABLET | Freq: Every day | ORAL | Status: DC
  Administered 2015-10-12 – 2015-10-13 (×2): 100 mg via ORAL
  Filled 2015-10-11 (×4): qty 1

## 2015-10-11 MED ORDER — ONDANSETRON 4 MG PO TBDP
4.0000 mg | ORAL_TABLET | Freq: Once | ORAL | Status: AC
  Administered 2015-10-11: 4 mg via ORAL

## 2015-10-11 NOTE — Progress Notes (Signed)
D: Diamond Rice admits SI but contracts for safety. On her self inventory sheet, she rated depression, hopelessness, and anxiety a ten. She appears dysphoric but cooperative. She did not attend a.m group. She ambulates with a walker.  A: Meds given as ordered. Q15 safety checks maintained. Support/encouragement offered. R: Pt remains free from harm and continues with treatment. Will continue to monitor for needs/safety.

## 2015-10-11 NOTE — Progress Notes (Signed)
D:Patient in her room in bed awake on approach.  Patient states she had a bad day.  Patient states she continues to be depressed and anxious.  Patient rates Anxiety and depression 10/10.  Patient states she did not have a goal today.  Patient states she is passive SI and hears voices that tell her to give up.  Patient denies HI and visual hallucinations.  Patient verbally contracts for safety.  Patient does appear to had a depressed affect and Depressed mood. A: Staff to monitor Q 15 mins for safety.  Encouragement and support offered.  Scheduled medications administered per orders.   R: Patient remains safe on the unit.  Patient did not attend group tonight.  Patient visible on the unit.  Patient taking administered medications.

## 2015-10-11 NOTE — [facility] Group Notes (Signed)
[facility] LCSW Group Therapy  10/11/2015 1:20 PM  Type of Therapy:  Group Therapy  Participation Level:  Minimal  Participation Quality:  Attentive and Resistant  Affect:  Depressed  Cognitive:  Alert  Insight:  Limited  Engagement in Therapy:  Limited  Modes of Intervention:  Confrontation, Discussion, Education, Exploration, Problem-solving, Rapport Building, Socialization and Support  Summary of Progress/Problems: Today's Topic: Overcoming Obstacles. Patients identified one short term goal and potential obstacles in reaching this goal. Patients processed barriers involved in overcoming these obstacles. Patients identified steps necessary for overcoming these obstacles and explored motivation (internal and external) for facing these difficulties head on. Patient remained quiet and occasionally tearful during group, appeared to listen w interest to contributions of others, did not want to discuss her own thoughts.  When asked to state a goal, she was unable to do so, was prompted and agreed to goal of "continue to show up."  Edwyna Shell, [place] Worker Phone:  [phone]

## 2015-10-11 NOTE — [facility] Group Notes (Signed)
[facility] LCSW Aftercare Discharge Planning Group Note  10/11/2015 8:45 AM  Pt did not attend, declined invitation.   Peri Maris, [place] 10/11/2015 9:40 AM
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
## 2015-10-11 NOTE — Plan of Care (Signed)
Problem: Ineffective individual coping Goal: STG: Patient will remain free from self harm Outcome: Progressing Pt verbalizes SI but is able to contract for safety and has refrained from self harm.   Problem: Diagnosis: Increased Risk For Suicide Attempt Goal: LTG-Patient Will Report Improved Mood and Deny Suicidal LTG (by discharge) Patient will report improved mood and deny suicidal ideation.  Outcome: Not Progressing Pt still reports SI but is able to contract for safety.     Goal: LTG-Patient Will Report Improvement in Psychotic Symptoms LTG (by discharge) : Patient will report improvement in psychotic symptoms.  Outcome: Progressing Pt denies AVH. No RIS witnessed.    Goal: STG-Patient Will Report Suicidal Feelings to Staff Outcome: Progressing Pt has reported SI today and contracts for safety.

## 2015-10-12 LAB — GLUCOSE, CAPILLARY
GLUCOSE-CAPILLARY: 161 mg/dL — AB (ref 65–99)
GLUCOSE-CAPILLARY: 308 mg/dL — AB (ref 65–99)

## 2015-10-12 MED ORDER — HYDRALAZINE HCL 25 MG PO TABS
25.0000 mg | ORAL_TABLET | Freq: Four times a day (QID) | ORAL | Status: DC
  Administered 2015-10-12 – 2015-10-21 (×35): 25 mg via ORAL
  Filled 2015-10-12 (×12): qty 1
  Filled 2015-10-12: qty 28
  Filled 2015-10-12 (×13): qty 1
  Filled 2015-10-12: qty 28
  Filled 2015-10-12: qty 1
  Filled 2015-10-12: qty 28
  Filled 2015-10-12 (×15): qty 1
  Filled 2015-10-12: qty 28

## 2015-10-12 MED ORDER — MIRTAZAPINE 7.5 MG PO TABS
7.5000 mg | ORAL_TABLET | Freq: Every day | ORAL | Status: DC
  Administered 2015-10-12 – 2015-10-13 (×2): 7.5 mg via ORAL
  Filled 2015-10-12 (×3): qty 1

## 2015-10-12 MED ORDER — POTASSIUM CHLORIDE CRYS ER 20 MEQ PO TBCR
20.0000 meq | EXTENDED_RELEASE_TABLET | Freq: Two times a day (BID) | ORAL | Status: AC
  Administered 2015-10-12 – 2015-10-13 (×4): 20 meq via ORAL
  Filled 2015-10-12 (×4): qty 1

## 2015-10-12 MED ORDER — INSULIN ASPART 100 UNIT/ML ~~LOC~~ SOLN
0.0000 [IU] | Freq: Three times a day (TID) | SUBCUTANEOUS | Status: DC
  Administered 2015-10-13: 11 [IU] via SUBCUTANEOUS
  Administered 2015-10-13 – 2015-10-14 (×3): 4 [IU] via SUBCUTANEOUS
  Administered 2015-10-14: 15 [IU] via SUBCUTANEOUS
  Administered 2015-10-15: 7 [IU] via SUBCUTANEOUS
  Administered 2015-10-15: 4 [IU] via SUBCUTANEOUS
  Administered 2015-10-16: 7 [IU] via SUBCUTANEOUS
  Administered 2015-10-16 (×2): 4 [IU] via SUBCUTANEOUS
  Administered 2015-10-17: 7 [IU] via SUBCUTANEOUS
  Administered 2015-10-17: 20 [IU] via SUBCUTANEOUS
  Administered 2015-10-17: 4 [IU] via SUBCUTANEOUS
  Administered 2015-10-18: 7 [IU] via SUBCUTANEOUS
  Administered 2015-10-18: 3 [IU] via SUBCUTANEOUS
  Administered 2015-10-18 – 2015-10-19 (×2): 7 [IU] via SUBCUTANEOUS
  Administered 2015-10-19: 11 [IU] via SUBCUTANEOUS
  Administered 2015-10-19: 4 [IU] via SUBCUTANEOUS
  Administered 2015-10-20: 11 [IU] via SUBCUTANEOUS
  Administered 2015-10-20: 7 [IU] via SUBCUTANEOUS
  Administered 2015-10-20: 4 [IU] via SUBCUTANEOUS
  Administered 2015-10-21: 7 [IU] via SUBCUTANEOUS

## 2015-10-12 NOTE — [facility] Group Notes (Signed)
[place] LCSW Group Therapy 10/12/2015  1:15 PM   Type of Therapy: Group Therapy  Participation Level: Did Not Attend. Patient invited to participate but declined.   Tilden Fossa, MSW, [place] Clinical Social Worker [facility] [phone]

## 2015-10-12 NOTE — Progress Notes (Signed)
Adult Psychoeducational Group Note  Date:  10/12/2015 Time:  10:29 PM  Group Topic/Focus:  Wrap-Up Group:   The focus of this group is to help patients review their daily goal of treatment and discuss progress on daily workbooks.  Participation Level:  Minimal  Participation Quality:  Appropriate  Affect:  Appropriate  Cognitive:  Alert  Insight: Appropriate  Engagement in Group:  Engaged  Modes of Intervention:  Discussion  Additional Comments:  Pt states that today she has been feeling depressed. She did not set a goal for today, and she does not have one for tomorrow either.   Diamond Rice 10/12/2015, 10:29 PM

## 2015-10-12 NOTE — [facility] Group Notes (Signed)
[facility] Group Notes:  (Nursing/MHT/Case Management/Adjunct)  Date:  10/12/2015  Time:  9:51 AM  Type of Therapy:  Psychoeducational Skills  Participation Level:  Did Not Attend  Participation Quality:  Did not attend  Affect:  N/A  Cognitive:  N/A  Insight:  None  Engagement in Group:  None  Modes of Intervention:  Discussion and Education  Summary of Progress/Problems: Patient was invited but did not attend group.   Lorain Keast E 10/12/2015, 9:51 AM 

## 2015-10-12 NOTE — Progress Notes (Signed)
Patient ID: Diamond Rice, female   DOB: 1971-03-04, 45 y.o.   MRN: [id number]  DAR: Pt. Denies HI and A/V Hallucinations. She reports passive SI but is able to contract for safety. She reports sleep is fair, appetite is fair, energy level is low, and concentration is poor. She rates depression, hopelessness, and anxiety 9/10.  Patient does report pain and is receiving scheduled and PRN medication for this. Support and encouragement provided to the patient. Scheduled medications administered to patient per physician's orders. Patient is minimal and isolative. She is currently not attending any groups. Q15 minute checks are maintained for safety.

## 2015-10-12 NOTE — Plan of Care (Signed)
Problem: Alteration in mood & ability to function due to Goal: STG-Patient will attend groups Outcome: Not Progressing Patient is not attending groups, isolating in room.

## 2015-10-12 NOTE — Progress Notes (Signed)
Recreation Therapy Notes  Animal-Assisted Activity (AAA) Program Checklist/Progress Notes Patient Eligibility Criteria Checklist & Daily Group note for Rec Tx Intervention  Date: 02.14.2017 Time: 2:45pm Location: [address]    AAA/T Program Assumption of Risk Form signed by Patient/ or Parent Legal Guardian yes  Patient is free of allergies or sever asthma yes  Patient reports no fear of animals yes  Patient reports no history of cruelty to animals yes  Patient understands his/her participation is voluntary yes  Patient washes hands before animal contact yes  Patient washes hands after animal contact yes  Behavioral Response: Appropriate  Education: Hand Washing, Appropriate Animal Interaction   Education Outcome: Acknowledges education.   Clinical Observations/Feedback: Patient pet therapy dog appropriately and interacted with peers appropriately during session.   Laureen Ochs Corrin Sieling, LRT/CTRS        Lane Hacker 10/12/2015 3:19 PM

## 2015-10-12 NOTE — Progress Notes (Signed)
Patient ID: Diamond Rice, female   DOB: 08/13/1971, 45 y.o.   MRN: [id number] [facility] MD Progress Note  10/12/2015 2:32 PM Diamond Rice  MRN:  [id number] Subjective:  Patient reports ongoing depression, sadness, and a sense of hopelessness. Reports some passive SI, but today is able to contract for safety and denies any current plan or intention of suicide - states " I know you guys are trying to help, but I still feel depressed" Ruminates about significant psychosocial  Stressors, mainly homelessness, limited support network , and neurological sequelae of her CVA one year ago ( L hemiparesis) . Denies medication side effects.  Objective: I have discussed case with treatment team and have met with patient. As discussed with staff remains depressed, sad, isolative, with limited milieu participation. At this time does not endorse  hallucinations, nor does she appear internally preoccupied , but yesterday did report some ongoing hallucinations. No disruptive behaviors on unit, continues to isolate  No  medication side effects Labs - K+  3.0     Principal Problem: MDD (major depressive disorder), recurrent, severe, with psychosis ([place]) Diagnosis:   Patient Active Problem List   Diagnosis Date Noted  . MDD (major depressive disorder), recurrent, severe, with psychosis ([place]) [F33.3] 10/07/2015  . OSA (obstructive sleep apnea) [G47.33] 06/09/2015  . Vitamin D deficiency [E55.9] 01/06/2015  . Bilateral knee pain [M25.561, M25.562] 01/05/2015  . Numbness and tingling in right hand [R20.2] 01/05/2015  . Insomnia [G47.00] 12/07/2014  . Right-sided lacunar infarction ([place]) [I63.9] 09/15/2014  . Left hemiparesis ([place]) [G81.94] 09/15/2014  . Dyspnea [R06.00]   . Back pain [M54.9] 09/11/2013  . Psychoactive substance-induced organic mood disorder ([place]) [V37.48, F06.30] 05/28/2013  . Cocaine abuse with cocaine-induced mood disorder ([place]) [F14.14] 05/28/2013  . Cannabis dependence with  cannabis-induced anxiety disorder ([place]) [F12.280] 05/28/2013  . Morbid obesity with BMI of 45.0-49.9, adult ([place]) [E66.01, Z68.42] 04/25/2012  . Chest pain [R07.9] 04/17/2012  . Hypertension [I10] 04/17/2012  . Diabetes mellitus type 2 with complications, uncontrolled ([place]) [E11.8, E11.65] 04/17/2012  . Hyperlipidemia [E78.5] 04/17/2012  . Tobacco use [Z72.0] 04/17/2012   Total Time spent with patient:  20 minutes   Past Psychiatric History: SEE ABOVE  Past Medical History:  Past Medical History  Diagnosis Date  . Hypertension   . Anginal pain ([place])   . High cholesterol   . Shortness of breath 04/17/2012    "all the time"  . Headache(784.0) 04/17/2012    "~ qod; lately waking up in am w/one"  . Migraines 04/17/2012  . Chronic lower back pain 04/17/2012    "just got over some; catched when I walked"  . Depression   . Anxiety   . Sleep apnea   . Obesity   . Stroke ([place])   . Type II diabetes mellitus ([place]) 08/28/2002  . COPD (chronic obstructive pulmonary disease) [facility])     Past Surgical History  Procedure Laterality Date  . Cardiac catheterization  ~ 2011   Family History:  Family History  Problem Relation Age of Onset  . Stroke Brother 85  . Stroke Brother 84  . Heart attack Mother 82  . Stroke Father 72   Family Psychiatric  History: SEE Above Social History:  History  Alcohol Use No    Comment: rare     History  Drug Use  . Yes  . Special: Marijuana, "Crack" cocaine    Social History   Social History  . Marital Status: Single    Spouse  Name: N/A  . Number of Children: N/A  . Years of Education: N/A   Social History Main Topics  . Smoking status: Current Every Day Smoker -- 0.50 packs/day for 10 years    Types: Cigarettes    Last Attempt to Quit: 12/07/2014  . Smokeless tobacco: Never Used  . Alcohol Use: No     Comment: rare  . Drug Use: Yes    Special: Marijuana, "Crack" cocaine  . Sexual Activity: Not Currently   Other Topics Concern  . 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   Additional Social History:   Sleep:  Improved   Appetite:  Fair  Current Medications: Current Facility-Administered Medications  Medication Dose Route Frequency Provider Last Rate Last Dose  . acetaminophen (TYLENOL) tablet 650 mg  650 mg Oral Q6H PRN Harriet Butte, NP   650 mg at 10/12/15 0816  . alum & mag hydroxide-simeth (MAALOX/MYLANTA) 200-200-20 MG/5ML suspension 30 mL  30 mL Oral Q4H PRN Harriet Butte, NP      . amLODipine (NORVASC) tablet 10 mg  10 mg Oral Daily Harriet Butte, NP   10 mg at 10/12/15 0815  . ARIPiprazole (ABILIFY) tablet 5 mg  5 mg Oral Daily Derrill Center, NP   5 mg at 10/12/15 0814  . aspirin EC tablet 325 mg  325 mg Oral Daily Bonnielee Haff, MD   325 mg at 10/12/15 0814  . atorvastatin (LIPITOR) tablet 40 mg  40 mg Oral q1800 Harriet Butte, NP   40 mg at 10/11/15 1710  . cyclobenzaprine (FLEXERIL) tablet 5 mg  5 mg Oral TID PRN Derrill Center, NP   5 mg at 10/10/15 1208  . diclofenac sodium (VOLTAREN) 1 % transdermal gel 2 g  2 g Topical TID WC Harriet Butte, NP   2 g at 10/12/15 1203  . hydrALAZINE (APRESOLINE) tablet 10 mg  10 mg Oral QID Harriet Butte, NP   10 mg at 10/12/15 1200  . lisinopril (PRINIVIL,ZESTRIL) tablet 40 mg  40 mg Oral Daily Jenne Campus, MD   40 mg at 10/12/15 9562   And  . hydrochlorothiazide (HYDRODIURIL) tablet 25 mg  25 mg Oral Daily Jenne Campus, MD   25 mg at 10/12/15 0814  . insulin glargine (LANTUS) injection 8 Units  8 Units Subcutaneous Daily Bonnielee Haff, MD   8 Units at 10/12/15 [phone]  . magnesium hydroxide (MILK OF MAGNESIA) suspension 30 mL  30 mL Oral Daily PRN Harriet Butte, NP      . ondansetron (ZOFRAN-ODT) disintegrating tablet 4 mg  4 mg Oral Q8H PRN Kerrie Buffalo, NP   4 mg at 10/12/15 1302  . pantoprazole (PROTONIX) EC tablet 40 mg  40 mg Oral Daily Harriet Butte, NP   40 mg at 10/12/15 0815  .  potassium chloride SA (K-DUR,KLOR-CON) CR tablet 20 mEq  20 mEq Oral BID Kerrie Buffalo, NP   20 mEq at 10/12/15 1200  . sertraline (ZOLOFT) tablet 100 mg  100 mg Oral Daily Jenne Campus, MD   100 mg at 10/12/15 0816  . traZODone (DESYREL) tablet 50 mg  50 mg Oral QHS,MR X 1 Derrill Center, NP   50 mg at 10/11/15 2141  . Vitamin D (Ergocalciferol) (DRISDOL) capsule 50,000 Units  50,000 Units Oral Q7 days Harriet Butte, NP   50,000 Units at 10/08/15  1333    Lab Results:  Results for orders placed or performed during the hospital encounter of 10/07/15 (from the past 48 hour(s))  Glucose, capillary     Status: Abnormal   Collection Time: 10/10/15  4:59 PM  Result Value Ref Range   Glucose-Capillary 212 (H) 65 - 99 mg/dL  Glucose, capillary     Status: Abnormal   Collection Time: 10/10/15  8:47 PM  Result Value Ref Range   Glucose-Capillary 257 (H) 65 - 99 mg/dL   Comment 1 Notify RN   Glucose, capillary     Status: Abnormal   Collection Time: 10/11/15  6:19 AM  Result Value Ref Range   Glucose-Capillary 180 (H) 65 - 99 mg/dL  Glucose, capillary     Status: Abnormal   Collection Time: 10/11/15  4:49 PM  Result Value Ref Range   Glucose-Capillary 287 (H) 65 - 99 mg/dL   Comment 1 Notify RN   Basic metabolic panel     Status: Abnormal   Collection Time: 10/11/15  6:37 PM  Result Value Ref Range   Sodium 136 135 - 145 mmol/L   Potassium 3.0 (L) 3.5 - 5.1 mmol/L   Chloride 97 (L) 101 - 111 mmol/L   CO2 30 22 - 32 mmol/L   Glucose, Bld 311 (H) 65 - 99 mg/dL   BUN 12 6 - 20 mg/dL   Creatinine, Ser 1.23 (H) 0.44 - 1.00 mg/dL   Calcium 9.1 8.9 - 10.3 mg/dL   GFR calc non Af Amer 53 (L) >60 mL/min   GFR calc Af Amer >60 >60 mL/min    Comment: (NOTE) The eGFR has been calculated using the CKD EPI equation. This calculation has not been validated in all clinical situations. eGFR's persistently <60 mL/min signify possible Chronic Kidney Disease.    Anion gap 9 5 - 15    Comment:  Performed at [facility]  Glucose, capillary     Status: Abnormal   Collection Time: 10/11/15  8:50 PM  Result Value Ref Range   Glucose-Capillary 221 (H) 65 - 99 mg/dL  Glucose, capillary     Status: Abnormal   Collection Time: 10/12/15  6:07 AM  Result Value Ref Range   Glucose-Capillary 161 (H) 65 - 99 mg/dL   Comment 1 Notify RN     Physical Findings: AIMS: Facial and Oral Movements Muscles of Facial Expression: None, normal Lips and Perioral Area: None, normal Jaw: None, normal Tongue: None, normal,Extremity Movements Upper (arms, wrists, hands, fingers): None, normal Lower (legs, knees, ankles, toes): None, normal, Trunk Movements Neck, shoulders, hips: None, normal, Overall Severity Severity of abnormal movements (highest score from questions above): None, normal Incapacitation due to abnormal movements: None, normal Patient's awareness of abnormal movements (rate only patient's report): No Awareness, Dental Status Current problems with teeth and/or dentures?: No Does patient usually wear dentures?: No  CIWA:  CIWA-Ar Total: 0 COWS:     Musculoskeletal: Strength & Muscle Tone: within normal limits Gait & Station: normal patient ambulates with a walker. Patient leans: N/A  Psychiatric Specialty Exam: Review of Systems  Psychiatric/Behavioral: Positive for depression and suicidal ideas. The patient is nervous/anxious and has insomnia.   All other systems reviewed and are negative. denies chest pain, no shortness of breath, hemiparesia ( stable ) S/P CVA one year ago   Blood pressure 151/94, pulse 83, temperature 98.2 F (36.8 C), temperature source Oral, resp. rate 17, height _0  (1.651 m), weight 251 lb (113.853 kg),  last menstrual period 10/03/2015, SpO2 99 %.Body mass index is 41.77 kg/(m^2).  General Appearance:  Fairly groomed   Engineer, water::  Good  Speech:  Clear and Coherent  Volume:  Decreased   Mood:  Remains depressed   Affect:   Constricted , sad  Thought Process:   linear  Orientation:  Full (Time, Place, and Person)  Thought Content:  Denies hallucinations  At this time and does not present internally preoccupied  Suicidal Thoughts:  Passive SI ,denies suicidal plan or intention and contracts for safety on unit  Homicidal Thoughts:  No  Memory:   Recent and remote grossly intact   Judgement:  Fair  Insight:  Fair  Psychomotor Activity:  Decreased   Concentration:  Fair  Recall:  [place] of Knowledge:Fair  Language: Good  Akathisia:  NA  Handed:  Right  AIMS (if indicated):     Assets:  Communication Skills Desire for Improvement Financial Resources/Insurance Social Support  ADL's:  Intact  Cognition: WNL  Sleep:  Number of Hours: 6.75   Assessment - patient remains depressed, sad, endorsing low energy level , hopelessness, some passive SI, but denying plan or intention of hurting self on unit and contracting for safety on unit. She is not presenting with hallucinations today. She is tolerating medications well thus far.  Labs remarkable for ongoing hypokalemia .  Agrees to adding Remeron to antidepressant regimen to help address ongoing symptoms .  Treatment Plan Summary: Daily contact with patient to assess and evaluate symptoms and progress in treatment and Medication management Continue Zoloft 100  mgrs QDAY for depression and anxiety Continue Abilify 5 mg QDAY for mood disorder/ augmentation  D/C Trazodone  Start Remeron 7.5 mgrs QHS for depression and insomnia  Continue Flexeril 71m  TID PRN for muscular spasms Continue antihypertensive regimen for HTN.  On potassium supplementation to address hypokalemia CNeita Garnet MD 10/12/2015, 2:32 PM

## 2015-10-12 NOTE — BHH Group Notes (Signed)
[employer] LCSW Group Therapy  10/12/2015 3:39 PM  Type of Therapy:  Group Therapy  Participation Level:  Active  Participation Quality:  Attentive  Affect:  Appropriate  Cognitive:  Alert and Oriented  Insight:  Improving  Engagement in Therapy:  Improving  Modes of Intervention:  Confrontation, Discussion, Education, Exploration, Problem-solving, Rapport Building, Socialization and Support  Summary of Progress/Problems: [facility] Speaker came to talk about his personal journey with substance abuse and addiction. The pt processed ways by which to relate to the speaker. [place] speaker provided handouts and educational information pertaining to groups and services offered by the [facility].   Smart, Vester Balthazor LCSW 10/12/2015, 3:39 PM

## 2015-10-12 NOTE — Progress Notes (Signed)
Patient seen by Dr. Maryland Pink on 10/08/2015.  K is still low-- will replace with PO and recheck in AM with Mg.  Also d/c HCTZ and increase hydralazine for BP.  I will follow up with patient in the AM for BP control as well as lab review.  Eulogio Bear DO [phone]

## 2015-10-13 LAB — BASIC METABOLIC PANEL
Anion gap: 9 (ref 5–15)
BUN: 10 mg/dL (ref 6–20)
CHLORIDE: 100 mmol/L — AB (ref 101–111)
CO2: 30 mmol/L (ref 22–32)
Calcium: 9.4 mg/dL (ref 8.9–10.3)
Creatinine, Ser: 0.96 mg/dL (ref 0.44–1.00)
GFR calc Af Amer: >60 mL/min (ref >60)
GFR calc non Af Amer: >60 mL/min (ref >60)
GLUCOSE: 195 mg/dL — AB (ref 65–99)
POTASSIUM: 3.4 mmol/L — AB (ref 3.5–5.1)
Sodium: 139 mmol/L (ref 135–145)

## 2015-10-13 LAB — GLUCOSE, CAPILLARY
GLUCOSE-CAPILLARY: 179 mg/dL — AB (ref 65–99)
GLUCOSE-CAPILLARY: 193 mg/dL — AB (ref 65–99)
Glucose-Capillary: 254 mg/dL — ABNORMAL HIGH (ref 65–99)
Glucose-Capillary: 181 mg/dL — ABNORMAL HIGH (ref 65–99)
Glucose-Capillary: 286 mg/dL — ABNORMAL HIGH (ref 65–99)
Glucose-Capillary: 201 mg/dL — ABNORMAL HIGH (ref 65–99)

## 2015-10-13 LAB — MAGNESIUM: Magnesium: 1.9 mg/dL (ref 1.7–2.4)

## 2015-10-13 MED ORDER — INSULIN GLARGINE 100 UNIT/ML ~~LOC~~ SOLN
12.0000 [IU] | Freq: Every day | SUBCUTANEOUS | Status: DC
  Administered 2015-10-14 – 2015-10-21 (×8): 12 [IU] via SUBCUTANEOUS

## 2015-10-13 MED ORDER — SERTRALINE HCL 50 MG PO TABS
150.0000 mg | ORAL_TABLET | Freq: Every day | ORAL | Status: DC
  Administered 2015-10-14 – 2015-10-19 (×6): 150 mg via ORAL
  Filled 2015-10-13 (×7): qty 3

## 2015-10-13 NOTE — Progress Notes (Addendum)
Patient ID: Diamond Rice, female   DOB: 07/20/1971, 45 y.o.   MRN: [id number]   Pt currently presents with a flat affect and depressed behavior. Per self inventory, pt rates depression, hopelessness and anxiety at a 9. Pt reports feeling sedated this morning.  Pt reports fair sleep, a fair appetite, low energy and poor concentration.   Pt provided with medications per providers orders. Pt's labs, cbgs and vitals were monitored throughout the day. Pt supported emotionally and encouraged to express concerns and questions. Pt educated on medications.  Pt's safety ensured with 15 minute and environmental checks. Pt endorses passive SI, no plan. Pt currently denies HI and A/V hallucinations. Pt verbally agrees to seek staff if SI/HI or A/VH occurs and to consult with staff before acting on any harmful thoughts. Will continue POC.

## 2015-10-13 NOTE — BHH Group Notes (Signed)
[facility] LCSW Aftercare Discharge Planning Group Note  10/13/2015  8:45 AM  Participation Quality: Did Not Attend. Patient invited to participate but declined.  Tilden Fossa, MSW, [place] Clinical Social Worker [facility] [phone]

## 2015-10-13 NOTE — Progress Notes (Signed)
PROGRESS NOTE  Diamond Rice DOB: 1971/06/17 DOA: 10/07/2015 PCP: Minerva Ends, MD  Assessment/Plan: Essential Hypertension:  -d/c HCTZ as making hypokalemia -increase hydralazine -would titrate hydralazine if further BP control needed -decent control currently (AM was SBP : 130s) -patient is unable to get scripts so will concentrate on medications on the $4 [facility] list (norvasc is not on the list)  DM -agree with adjustments from DM coordinator- will increase lantus -SSI -HgbA1C of 12 shows poor outpatient control - work up for gastroparesis as outpatient  Hypokalemia -replace PO -Mg WNL -encourage PO intake   Will need medications at d/c as patient says she cannot afford Can be d/c'd on this regimen to follow with [facility]- call back as needed   HPI/Subjective: Did not like what they served for lunch but acknowledged she needs to eat and eat on a regular basis for her DM  Objective: Filed Vitals:   10/13/15 0701 10/13/15 1147  BP: 138/82 159/95  Pulse: 98 87  Temp:    Resp:  18   No intake or output data in the 24 hours ending 10/13/15 1330 Filed Weights   10/07/15 2245  Weight: 113.853 kg (251 lb)    Exam:   General:  Awake- flat affect but good eye contact  Cardiovascular: rrr  Respiratory: clear  Abdomen: +BS  Musculoskeletal: no edema   Data Reviewed: Basic Metabolic Panel:  Recent Labs Lab 10/07/15 1034 10/11/15 1837 10/13/15 0645  NA 137 136 139  K 3.3* 3.0* 3.4*  CL 102 97* 100*  CO2 23 30 30   GLUCOSE 298* 311* 195*  BUN 7 12 10   CREATININE 1.19* 1.23* 0.96  CALCIUM 9.3 9.1 9.4  MG  --   --  1.9   Liver Function Tests: No results for input(s): AST, ALT, ALKPHOS, BILITOT, PROT, ALBUMIN in the last 168 hours. No results for input(s): LIPASE, AMYLASE in the last 168 hours. No results for input(s): AMMONIA in the last 168 hours. CBC:  Recent Labs Lab 10/07/15 1034  WBC 9.3  HGB 11.9*  HCT 37.0  MCV 69.9*    PLT 349   Cardiac Enzymes: No results for input(s): CKTOTAL, CKMB, CKMBINDEX, TROPONINI in the last 168 hours. BNP (last 3 results) No results for input(s): BNP in the last 8760 hours.  ProBNP (last 3 results) No results for input(s): PROBNP in the last 8760 hours.  CBG:  Recent Labs Lab 10/12/15 1711 10/12/15 2042 10/13/15 0606 10/13/15 0900 10/13/15 1152  GLUCAP 308* 254* 181* 179* 193*    No results found for this or any previous visit (from the past 240 hour(s)).   Studies: No results found.  Scheduled Meds: . amLODipine  10 mg Oral Daily  . ARIPiprazole  5 mg Oral Daily  . aspirin EC  325 mg Oral Daily  . atorvastatin  40 mg Oral q1800  . diclofenac sodium  2 g Topical TID WC  . hydrALAZINE  25 mg Oral QID  . insulin aspart  0-20 Units Subcutaneous TID WC  . insulin glargine  8 Units Subcutaneous Daily  . lisinopril  40 mg Oral Daily  . mirtazapine  7.5 mg Oral QHS  . pantoprazole  40 mg Oral Daily  . potassium chloride  20 mEq Oral BID  . [START ON 10/14/2015] sertraline  150 mg Oral Daily  . Vitamin D (Ergocalciferol)  50,000 Units Oral Q7 days   Continuous Infusions:  Antibiotics Given (last 72 hours)    None  Principal Problem:   MDD (major depressive disorder), recurrent, severe, with psychosis ([place]) Active Problems:   Hypertension   Diabetes mellitus type 2 with complications, uncontrolled ([place])    Time spent: 25 min    [place] Pager [phone] If 7PM-7AM, please contact night-coverage at [email or web address], password [facility] 10/13/2015, 1:30 PM  LOS: 6 days

## 2015-10-13 NOTE — Progress Notes (Addendum)
Inpatient Diabetes Program Recommendations  AACE/ADA: New Consensus Statement on Inpatient Glycemic Control (2015)  Target Ranges:  Prepandial:   less than 140 mg/dL      Peak postprandial:   less than 180 mg/dL (1-2 hours)      Critically ill patients:  140 - 180 mg/dL   Review of Glycemic Control  Diabetes history: DM2 Outpatient Diabetes medications: Toujeo 13 units QD, Amaryl 8 mg QAM Current orders for Inpatient glycemic control: Lantus 8 units QAM, Novolog resistant tidwc  Results for Diamond Rice, Diamond Rice (MRN [id number]) as of 10/13/2015 10:24  Ref. Range 10/09/2015 06:31  Hemoglobin A1C Latest Ref Range: 4.8-5.6 % 12.3 (H)  Results for Diamond Rice, Diamond Rice (MRN [id number]) as of 10/13/2015 10:24  Ref. Range 10/12/2015 06:07 10/12/2015 17:11 10/12/2015 20:42 10/13/2015 06:06 10/13/2015 09:00  Glucose-Capillary Latest Ref Range: 65-99 mg/dL 161 (H) 308 (H) 254 (H) 181 (H) 179 (H)  Poor control at home. ? Whether pt is taking insulin daily.  Inpatient Diabetes Program Recommendations:   Insulin - Basal: Increase Lantus to 12 units QD Correction (SSI): Add HS correction Insulin - Meal Coverage: If post-prandial blood sugars > 180 mg/dL, add Novolog 3 units tidwc Oral Agents: Amaryl 8 mg QAM HgbA1C: 12.3% - uncontrolled Diet: Encourage pt to make healthy choices in hospital cafeteria.   Will continue to follow. Needs appt with PCP at [facility] at discharge for diabetes management.   Thank you. Lorenda Peck, RD, LDN, CDE Inpatient Diabetes Coordinator [phone]

## 2015-10-13 NOTE — Tx Team (Signed)
Interdisciplinary Treatment Plan Update (Adult) Date: 10/13/2015    Time Reviewed: 9:30 AM  Progress in Treatment: Attending groups: No Participating in groups: No Taking medication as prescribed: Yes Tolerating medication: Yes Family/Significant other contact made: No, patient declined for collateral contact Patient understands diagnosis: Yes Discussing patient identified problems/goals with staff: Yes Medical problems stabilized or resolved: Yes Denies suicidal/homicidal ideation: Treatment team continuing to assess Issues/concerns per patient self-inventory: Yes Other:  New problem(s) identified: N/A  Discharge Plan or Barriers: Patient is homeless with limited supports, will likely discharge to shelter to follow up with [facility] and [facility] case managers.  Reason for Continuation of Hospitalization:  Depression Anxiety Medication Stabilization   Comments: N/A  Estimated length of stay: 2-3 days    Patient is a 45 year old female with a diagnosis of Major Depressive Disorder. Pt presented to the hospital with suicidal thoughts and substance abuse. Pt reports primary trigger(s) for admission were financial and housing stressors. Patient will benefit from crisis stabilization, medication evaluation, group therapy and psycho education in addition to case management for discharge planning. At discharge, it is recommended that Pt remain compliant with established discharge plan and continued treatment.   Review of initial/current patient goals per problem list:  1. Goal(s): Patient will participate in aftercare plan   Met: No   Target date: 3-5 days post admission date   As evidenced by: Patient will participate within aftercare plan AEB aftercare provider and housing plan at discharge being identified.  2/10: Goal not met: CSW assessing for appropriate referrals for pt and will have follow up secured prior to d/c.  2/15: Goal progressing. Patient will likely discharge to  shelter to follow up with [facility] program.    2. Goal (s): Patient will exhibit decreased depressive symptoms and suicidal ideations.   Met: No   Target date: 3-5 days post admission date   As evidenced by: Patient will utilize self rating of depression at 3 or below and demonstrate decreased signs of depression or be deemed stable for discharge by MD.  2/10: Goal not met: Pt presents with flat affect and depressed mood.  Pt admitted with depression rating of 10.  Pt to show decreased sign of depression and a rating of 3 or less before d/c.    2/15: Goal progressing. Patient continues to endorse high levels of depression and appears disengaged from milieu, continues to isolate.     3. Goal(s): Patient will demonstrate decreased signs and symptoms of anxiety.   Met: No   Target date: 3-5 days post admission date   As evidenced by: Patient will utilize self rating of anxiety at 3 or below and demonstrated decreased signs of anxiety, or be deemed stable for discharge by MD   2/10: Goal not met: Pt presents with anxious mood and affect.  Pt admitted with anxiety rating of 10.  Pt to show decreased sign of anxiety and a rating of 3 or less before d/c.  2/15: Goal progressing. Patient appears disengaged from milieu, continues to isolate.     4. Goal(s): Patient will demonstrate decreased signs of withdrawal due to substance abuse   Met: Yes   Target date: 3-5 days post admission date   As evidenced by: Patient will produce a CIWA/COWS score of 0, have stable vitals signs, and no symptoms of withdrawal  2/10: Goal met. No withdrawal symptoms reported at this time per medical chart.      Attendees: Patient:    Family:    Physician:  Dr. Parke Poisson; Dr. Sabra Heck 10/13/2015 9:30 AM  Nursing: Franco Nones, Angelena Form, RN 10/13/2015 9:30 AM  Clinical Social Worker: Tilden Fossa, LCSW 10/13/2015 9:30 AM  Other: Peri Maris, LCSWA; Heather Smart, LCSW   10/13/2015 9:30 AM  Other: Norberto Sorenson, P4CC 10/13/2015 9:30 AM  Other: Lars Pinks, CM 10/13/2015 9:30 AM  Other: May Malachi Carl, NP 10/13/2015 9:30 AM  Other:          Scribe for Treatment Team:  Tilden Fossa, [place]

## 2015-10-13 NOTE — Progress Notes (Signed)
Adult Psychoeducational Group Note  Date:  10/13/2015 Time:  9:38 PM  Group Topic/Focus:  Wrap-Up Group:   The focus of this group is to help patients review their daily goal of treatment and discuss progress on daily workbooks.  Participation Level:  Minimal  Participation Quality:  Appropriate  Affect:  Appropriate  Cognitive:  Alert  Insight: Appropriate  Engagement in Group:  Engaged  Modes of Intervention:  Discussion  Additional Comments:  Pt stated that today was better than yesterday. Her goal for tomorrow is to attend a morning. group.   Sharmon Revere 10/13/2015, 9:38 PM

## 2015-10-13 NOTE — [facility] Group Notes (Signed)
[place] LCSW Group Therapy 10/13/2015  1:15 PM Type of Therapy: Group Therapy Participation Level: Active  Participation Quality: Attentive, Sharing and Supportive  Affect: Depressed and Flat  Cognitive: Alert and Oriented  Insight: Developing/Improving and Engaged  Engagement in Therapy: Developing/Improving and Engaged  Modes of Intervention: Clarification, Confrontation, Discussion, Education, Exploration, Limit-setting, Orientation, Problem-solving, Rapport Building, Art therapist, Socialization and Support  Summary of Progress/Problems: The topic for group today was emotional regulation. This group focused on both positive and negative emotion identification and allowed group members to process ways to identify feelings, regulate negative emotions, and find healthy ways to manage internal/external emotions. Group members were asked to reflect on a time when their reaction to an emotion led to a negative outcome and explored how alternative responses using emotion regulation would have benefited them. Group members were also asked to discuss a time when emotion regulation was utilized when a negative emotion was experienced. Patient did not engage in discussion despite CSW encouragement.  Tilden Fossa, MSW, [place] Clinical Social Worker [facility] [phone]

## 2015-10-13 NOTE — Progress Notes (Signed)
Recreation Therapy Notes  Date: 02.17.2017 Time: 9:30am Location: [address] Group Room   Group Topic: Stress Management  Goal Area(s) Addresses:  Patient will actively participate in stress management techniques presented during session.   Behavioral Response: Did not attend.    [place] Daisey L Zahria Ding, LRT/CTRS        Caliope Ruppert L 10/13/2015 2:09 PM 

## 2015-10-13 NOTE — Progress Notes (Signed)
Patient ID: Diamond Rice, female   DOB: Apr 28, 1971, 45 y.o.   MRN: [id number] [facility] MD Progress Note  10/13/2015 12:33 PM Diamond Rice  MRN:  [id number] Subjective:  Patient describes ongoing depression, sadness and passive thoughts of death, dying , but denying any plan or intention of hurting self on unit. Denies medication side effects. Of note, (+) nausea and states she vomited earlier ( clear fluid, no blood )   Objective: I have discussed case with treatment team and have met with patient. Remains depressed, sad, in bed often, with limited participation in milieu, although has been spending some time in day room, and has been going to some groups. She continues to present with sad and constricted affect. She reports occasional auditory hallucinations, which are critical in nature, denies command hallucinations and does not express any delusional thoughts. At this time not internally preoccupied . Appreciate hospitalist consultation for assistance with electrolyte management and antihypertensive medication adjustments. No disruptive behaviors on unit, continues to isolate  Does note endorse  medication side effects Labs;  K+ now improved, but still low-  3.4 , BUN and Creatinine now within normal. Blood glucose levels have varied between 181 and 287      Principal Problem: MDD (major depressive disorder), recurrent, severe, with psychosis ([place]) Diagnosis:   Patient Active Problem List   Diagnosis Date Noted  . MDD (major depressive disorder), recurrent, severe, with psychosis ([place]) [F33.3] 10/07/2015  . OSA (obstructive sleep apnea) [G47.33] 06/09/2015  . Vitamin D deficiency [E55.9] 01/06/2015  . Bilateral knee pain [M25.561, M25.562] 01/05/2015  . Numbness and tingling in right hand [R20.2] 01/05/2015  . Insomnia [G47.00] 12/07/2014  . Right-sided lacunar infarction ([place]) [I63.9] 09/15/2014  . Left hemiparesis ([place]) [G81.94] 09/15/2014  . Dyspnea [R06.00]   . Back pain  [M54.9] 09/11/2013  . Psychoactive substance-induced organic mood disorder ([place]) [X21.19, F06.30] 05/28/2013  . Cocaine abuse with cocaine-induced mood disorder ([place]) [F14.14] 05/28/2013  . Cannabis dependence with cannabis-induced anxiety disorder ([place]) [F12.280] 05/28/2013  . Morbid obesity with BMI of 45.0-49.9, adult ([place]) [E66.01, Z68.42] 04/25/2012  . Chest pain [R07.9] 04/17/2012  . Hypertension [I10] 04/17/2012  . Diabetes mellitus type 2 with complications, uncontrolled ([place]) [E11.8, E11.65] 04/17/2012  . Hyperlipidemia [E78.5] 04/17/2012  . Tobacco use [Z72.0] 04/17/2012   Total Time spent with patient:  20 minutes   Past Psychiatric History: SEE ABOVE  Past Medical History:  Past Medical History  Diagnosis Date  . Hypertension   . Anginal pain ([place])   . High cholesterol   . Shortness of breath 04/17/2012    "all the time"  . Headache(784.0) 04/17/2012    "~ qod; lately waking up in am w/one"  . Migraines 04/17/2012  . Chronic lower back pain 04/17/2012    "just got over some; catched when I walked"  . Depression   . Anxiety   . Sleep apnea   . Obesity   . Stroke ([place])   . Type II diabetes mellitus ([place]) 08/28/2002  . COPD (chronic obstructive pulmonary disease) [facility])     Past Surgical History  Procedure Laterality Date  . Cardiac catheterization  ~ 2011   Family History:  Family History  Problem Relation Age of Onset  . Stroke Brother 12  . Stroke Brother 6  . Heart attack Mother 74  . Stroke Father 89   Family Psychiatric  History: SEE Above Social History:  History  Alcohol Use No    Comment: rare  History  Drug Use  . Yes  . Special: Marijuana, "Crack" cocaine    Social History   Social History  . Marital Status: Single    Spouse Name: N/A  . Number of Children: N/A  . Years of Education: N/A   Social History Main Topics  . Smoking status: Current Every Day Smoker -- 0.50 packs/day for 10 years    Types: Cigarettes    Last Attempt to  Quit: 12/07/2014  . Smokeless tobacco: Never Used  . Alcohol Use: No     Comment: rare  . Drug Use: Yes    Special: Marijuana, "Crack" cocaine  . Sexual Activity: Not Currently   Other Topics Concern  . 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   Additional Social History:   Sleep:  Improved   Appetite:  Fair  Current Medications: Current Facility-Administered Medications  Medication Dose Route Frequency Provider Last Rate Last Dose  . acetaminophen (TYLENOL) tablet 650 mg  650 mg Oral Q6H PRN Harriet Butte, NP   650 mg at 10/12/15 0816  . alum & mag hydroxide-simeth (MAALOX/MYLANTA) 200-200-20 MG/5ML suspension 30 mL  30 mL Oral Q4H PRN Harriet Butte, NP      . amLODipine (NORVASC) tablet 10 mg  10 mg Oral Daily Harriet Butte, NP   10 mg at 10/13/15 0859  . ARIPiprazole (ABILIFY) tablet 5 mg  5 mg Oral Daily Derrill Center, NP   5 mg at 10/13/15 0859  . aspirin EC tablet 325 mg  325 mg Oral Daily Bonnielee Haff, MD   325 mg at 10/13/15 0859  . atorvastatin (LIPITOR) tablet 40 mg  40 mg Oral q1800 Harriet Butte, NP   40 mg at 10/12/15 1714  . cyclobenzaprine (FLEXERIL) tablet 5 mg  5 mg Oral TID PRN Derrill Center, NP   5 mg at 10/10/15 1208  . diclofenac sodium (VOLTAREN) 1 % transdermal gel 2 g  2 g Topical TID WC Harriet Butte, NP   2 g at 10/13/15 3888  . hydrALAZINE (APRESOLINE) tablet 25 mg  25 mg Oral QID Geradine Girt, DO   25 mg at 10/13/15 1156  . insulin aspart (novoLOG) injection 0-20 Units  0-20 Units Subcutaneous TID WC Diamond Buffalo, NP   4 Units at 10/13/15 [phone]  . insulin glargine (LANTUS) injection 8 Units  8 Units Subcutaneous Daily Bonnielee Haff, MD   8 Units at 10/13/15 0900  . lisinopril (PRINIVIL,ZESTRIL) tablet 40 mg  40 mg Oral Daily Jenne Campus, MD   40 mg at 10/13/15 0859  . magnesium hydroxide (MILK OF MAGNESIA) suspension 30 mL  30 mL Oral Daily PRN Harriet Butte, NP      .  mirtazapine (REMERON) tablet 7.5 mg  7.5 mg Oral QHS Diamond Peer Adilene Areola, MD   7.5 mg at 10/12/15 2129  . ondansetron (ZOFRAN-ODT) disintegrating tablet 4 mg  4 mg Oral Q8H PRN Diamond Buffalo, NP   4 mg at 10/13/15 1021  . pantoprazole (PROTONIX) EC tablet 40 mg  40 mg Oral Daily Harriet Butte, NP   40 mg at 10/13/15 0900  . potassium chloride SA (K-DUR,KLOR-CON) CR tablet 20 mEq  20 mEq Oral BID Diamond Buffalo, NP   20 mEq at 10/13/15 0859  . sertraline (ZOLOFT) tablet 100 mg  100 mg Oral Daily Jenne Campus, MD   100 mg at  10/13/15 0900  . Vitamin D (Ergocalciferol) (DRISDOL) capsule 50,000 Units  50,000 Units Oral Q7 days Harriet Butte, NP   50,000 Units at 10/08/15 1333    Lab Results:  Results for orders placed or performed during the hospital encounter of 10/07/15 (from the past 48 hour(s))  Glucose, capillary     Status: Abnormal   Collection Time: 10/11/15  4:49 PM  Result Value Ref Range   Glucose-Capillary 287 (H) 65 - 99 mg/dL   Comment 1 Notify RN   Basic metabolic panel     Status: Abnormal   Collection Time: 10/11/15  6:37 PM  Result Value Ref Range   Sodium 136 135 - 145 mmol/L   Potassium 3.0 (L) 3.5 - 5.1 mmol/L   Chloride 97 (L) 101 - 111 mmol/L   CO2 30 22 - 32 mmol/L   Glucose, Bld 311 (H) 65 - 99 mg/dL   BUN 12 6 - 20 mg/dL   Creatinine, Ser 1.23 (H) 0.44 - 1.00 mg/dL   Calcium 9.1 8.9 - 10.3 mg/dL   GFR calc non Af Amer 53 (L) >60 mL/min   GFR calc Af Amer >60 >60 mL/min    Comment: (NOTE) The eGFR has been calculated using the CKD EPI equation. This calculation has not been validated in all clinical situations. eGFR's persistently <60 mL/min signify possible Chronic Kidney Disease.    Anion gap 9 5 - 15    Comment: Performed at [facility]  Glucose, capillary     Status: Abnormal   Collection Time: 10/11/15  8:50 PM  Result Value Ref Range   Glucose-Capillary 221 (H) 65 - 99 mg/dL  Glucose, capillary     Status: Abnormal    Collection Time: 10/12/15  6:07 AM  Result Value Ref Range   Glucose-Capillary 161 (H) 65 - 99 mg/dL   Comment 1 Notify RN   Glucose, capillary     Status: Abnormal   Collection Time: 10/12/15  5:11 PM  Result Value Ref Range   Glucose-Capillary 308 (H) 65 - 99 mg/dL  Glucose, capillary     Status: Abnormal   Collection Time: 10/12/15  8:42 PM  Result Value Ref Range   Glucose-Capillary 254 (H) 65 - 99 mg/dL  Glucose, capillary     Status: Abnormal   Collection Time: 10/13/15  6:06 AM  Result Value Ref Range   Glucose-Capillary 181 (H) 65 - 99 mg/dL  Basic metabolic panel     Status: Abnormal   Collection Time: 10/13/15  6:45 AM  Result Value Ref Range   Sodium 139 135 - 145 mmol/L   Potassium 3.4 (L) 3.5 - 5.1 mmol/L   Chloride 100 (L) 101 - 111 mmol/L   CO2 30 22 - 32 mmol/L   Glucose, Bld 195 (H) 65 - 99 mg/dL   BUN 10 6 - 20 mg/dL   Creatinine, Ser 0.96 0.44 - 1.00 mg/dL   Calcium 9.4 8.9 - 10.3 mg/dL   GFR calc non Af Amer >60 >60 mL/min   GFR calc Af Amer >60 >60 mL/min    Comment: (NOTE) The eGFR has been calculated using the CKD EPI equation. This calculation has not been validated in all clinical situations. eGFR's persistently <60 mL/min signify possible Chronic Kidney Disease.    Anion gap 9 5 - 15    Comment: Performed at [facility]  Magnesium     Status: None   Collection Time: 10/13/15  6:45 AM  Result  Value Ref Range   Magnesium 1.9 1.7 - 2.4 mg/dL    Comment: Performed at [facility]  Glucose, capillary     Status: Abnormal   Collection Time: 10/13/15  9:00 AM  Result Value Ref Range   Glucose-Capillary 179 (H) 65 - 99 mg/dL   Comment 1 Notify RN    Comment 2 Document in Chart   Glucose, capillary     Status: Abnormal   Collection Time: 10/13/15 11:52 AM  Result Value Ref Range   Glucose-Capillary 193 (H) 65 - 99 mg/dL   Comment 1 Notify RN    Comment 2 Document in Chart     Physical Findings: AIMS: Facial  and Oral Movements Muscles of Facial Expression: None, normal Lips and Perioral Area: None, normal Jaw: None, normal Tongue: None, normal,Extremity Movements Upper (arms, wrists, hands, fingers): None, normal Lower (legs, knees, ankles, toes): None, normal, Trunk Movements Neck, shoulders, hips: None, normal, Overall Severity Severity of abnormal movements (highest score from questions above): None, normal Incapacitation due to abnormal movements: None, normal Patient's awareness of abnormal movements (rate only patient's report): No Awareness, Dental Status Current problems with teeth and/or dentures?: No Does patient usually wear dentures?: No  CIWA:  CIWA-Ar Total: 0 COWS:     Musculoskeletal: Strength & Muscle Tone: within normal limits Gait & Station: normal patient ambulates with a walker. Patient leans: N/A  Psychiatric Specialty Exam: Review of Systems  Psychiatric/Behavioral: Positive for depression and suicidal ideas. The patient is nervous/anxious and has insomnia.   All other systems reviewed and are negative. denies chest pain, no shortness of breath, hemiparesia ( stable ) S/P CVA one year ago , nausea , vomiting x 1.   Blood pressure 159/95, pulse 87, temperature 98.5 F (36.9 C), temperature source Oral, resp. rate 18, height _0  (1.651 m), weight 251 lb (113.853 kg), last menstrual period 10/03/2015, SpO2 99 %.Body mass index is 41.77 kg/(m^2).  General Appearance:  Fairly groomed   Engineer, water::  Good  Speech:  Clear and Coherent  Volume:  Decreased   Mood:  Remains depressed   Affect:  Constricted   Thought Process:   linear  Orientation:  Full (Time, Place, and Person)  Thought Content:  Denies hallucinations  At this time and does not present internally preoccupied  Suicidal Thoughts: continues to report passive SI, does not endorse plan or intention of suicide at this time   Homicidal Thoughts:  No  Memory:   Recent and remote grossly intact    Judgement:  Fair  Insight:  Fair  Psychomotor Activity:  Decreased - continues to isolate   Concentration:  Fair  Recall:  [employer] of Knowledge:Fair  Language: Good  Akathisia:  NA  Handed:  Right  AIMS (if indicated):     Assets:  Communication Skills Desire for Improvement Financial Resources/Insurance Social Support  ADL's:  Intact  Cognition: WNL  Sleep:  Number of Hours: 5.75   Assessment - patient continues to present sad, depressed ,  Isolative/ withdrawn, with passive SI. She is tolerating medications well , anjd denies side effects. Currently on Abilify, Zoloft , Remeron.  Patient focused on homelessness as a main stressor.   Treatment Plan Summary: Daily contact with patient to assess and evaluate symptoms and progress in treatment and Medication management Increase Zoloft to 150   mgrs QDAY for depression and anxiety Continue Abilify 5 mg QDAY for mood disorder/ augmentation  Continue  Remeron 7.5 mgrs QHS for depression  and insomnia  Continue Flexeril 28m  TID PRN for muscular spasms Continue antihypertensive regimen for HTN.  On potassium supplementation to address hypokalemia Treatment team working on disposition planning  CNeita Garnet MD 10/13/2015, 12:33 PM

## 2015-10-13 NOTE — Progress Notes (Signed)
Pt reports that her day has been difficult.  She still feels very depressed.  She is also concerned about her high blood sugar levels.  She was placed on a sliding scale correction insulin to start tomorrow morning.  Pt has been observed sitting in the dayroom watching TV with minimal interaction with her peers.  She says she has some passive suicidal thoughts, but contracts for safety.  She says she is also hearing voices, but they are not selling her to do anything.  She denies HI.  She is using a cane to ambulate.  Pt agrees to inform staff if she has any needs or concerns.  Support and encouragement offered.  Discharge plans are in process.  Safety maintained with q15 minute checks.

## 2015-10-14 LAB — GLUCOSE, CAPILLARY
GLUCOSE-CAPILLARY: 312 mg/dL — AB (ref 65–99)
Glucose-Capillary: 195 mg/dL — ABNORMAL HIGH (ref 65–99)
Glucose-Capillary: 189 mg/dL — ABNORMAL HIGH (ref 65–99)
Glucose-Capillary: 215 mg/dL — ABNORMAL HIGH (ref 65–99)

## 2015-10-14 MED ORDER — ARIPIPRAZOLE 10 MG PO TABS
10.0000 mg | ORAL_TABLET | Freq: Every day | ORAL | Status: DC
Start: 2015-10-15 — End: 2015-10-15
  Administered 2015-10-15: 10 mg via ORAL
  Filled 2015-10-14 (×3): qty 1

## 2015-10-14 MED ORDER — MIRTAZAPINE 15 MG PO TABS
15.0000 mg | ORAL_TABLET | Freq: Every day | ORAL | Status: DC
  Administered 2015-10-14 – 2015-10-20 (×7): 15 mg via ORAL
  Filled 2015-10-14: qty 1
  Filled 2015-10-14: qty 7
  Filled 2015-10-14 (×7): qty 1

## 2015-10-14 NOTE — Progress Notes (Signed)
Adult Psychoeducational Group Note  Date:  10/14/2015 Time:  0900  Group Topic/Focus:  Orientation:   The focus of this group is to educate the patient on the purpose and policies of crisis stabilization and provide a format to answer questions about their admission.  The group details unit policies and expectations of patients while admitted.  Participation Level:  Did Not Attend  Participation Quality:    Affect:    Cognitive:    Insight:   Engagement in Group:    Modes of Intervention:    Additional Comments:    Ledger Heindl L 10/14/2015, 5:45 PM

## 2015-10-14 NOTE — Progress Notes (Signed)
D: Patient continues to have severe depressive symptoms.  She appears with flat, blunted affect.  Her speech is slow and soft.  She just wanted to lie in bed this morning.  Her hygiene is poor.  She rates her depression as 10; hopelessness as a 10; anxiety as a 10.  Her concentration is low.  She tends to isolate to her room, electing to miss groups.  She states, "I am having suicidal thoughts all the time now."  She does contract for safety on the unit.  She also reports auditory hallucinations telling her she is worthless.  She denies HI.   A: Continue to monitor medication management and MD orders.  Safety checks completed every 15 minutes per protocol.  Offer support and encouragement. R: Patient is isolative to room.

## 2015-10-14 NOTE — Plan of Care (Signed)
Problem: Ineffective individual coping Goal: LTG: Patient will report a decrease in negative feelings Outcome: Progressing Patient continues to express hopelessness and a desire to "not live."  She continues to isolate to her room.

## 2015-10-14 NOTE — Progress Notes (Signed)
Pt was in the dayroom most of the evening.  She still reports depression with passive suicidal thoughts.  She is able to contract for safety with Probation officer.  She denies HI, but says she is still hearing voices.  She is worried about her discharge, because she will most likely go to a shelter.  She says she was told she would follow up at [facility].  Pt presents flat and depressed, but brightens a little in conversation.  Pt makes her needs known to staff.  Discharge plans are in process.  Support and encouragement offered.  Safety maintained with q15 minute checks.

## 2015-10-14 NOTE — [facility] Group Notes (Signed)
[place] LCSW Group Therapy 10/14/2015 1:15 PM Type of Therapy: Group Therapy Participation Level: Active  Participation Quality: Attentive, Sharing and Supportive  Affect: Depressed and Flat  Cognitive: Alert and Oriented  Insight: Developing/Improving and Engaged  Engagement in Therapy: Developing/Improving and Engaged  Modes of Intervention: Activity, Clarification, Confrontation, Discussion, Education, Exploration, Limit-setting, Orientation, Problem-solving, Rapport Building, Art therapist, Socialization and Support  Summary of Progress/Problems: Patient was attentive and engaged with speaker from [place]. Patient was attentive to speaker while they shared their story of dealing with mental health and overcoming it. Patient expressed interest in their programs and services and received information on their agency. Patient processed ways they can relate to the speaker.   Tilden Fossa, LCSW Clinical Social Worker [facility] [phone]

## 2015-10-14 NOTE — Progress Notes (Signed)
Nutrition Education Note  Pt attended group focusing on general, healthful nutrition education.  RD emphasized the importance of eating regular meals and snacks throughout the day. Consuming sugar-free beverages and incorporating fruits and vegetables into diet when possible. Provided examples of healthy snacks. Patient encouraged to leave group with a goal to improve nutrition/healthy eating.   Diet Order: Diet regular Room service appropriate?: Yes; Fluid consistency:: Thin Pt is also offered choice of unit snacks mid-morning and mid-afternoon.  Pt is eating as desired.   If additional nutrition issues arise, please consult RD.  Anyssa Sharpless, MS, RD, LDN Pager: [phone] After Hours Pager: [phone]     

## 2015-10-14 NOTE — Progress Notes (Signed)
D: Pt presents flat in affect and depressed in mood. Pt is positive for SI. Pt verbally contracts for safety. Pt BP remains elevated. Pt is asymptomatic at this time. Scheduled hydralazine administered. Pt observed in the dayroom interacting with her peers. Pt is complaint with her POC.  A: Writer administered scheduled and prn medications to pt, per MD orders. Continued support and availability as needed was extended to this pt. Staff continues to monitor pt with q41min checks.  R: No adverse drug reactions noted. Pt receptive to treatment. Pt remains safe at this time.

## 2015-10-14 NOTE — Progress Notes (Addendum)
Patient ID: Diamond Rice, female   DOB: 11-Dec-1970, 45 y.o.   MRN: [id number] [facility] MD Progress Note  10/14/2015 2:11 PM Diamond Rice  MRN:  [id number] Subjective: Patient reports ongoing depression, sadness, and low energy level. She does report some improvement compared to admission, however, and has been increased milieu, group participation. At this time does not endorse active suicidal ideations, but has continued to have passive SI. She states she continues to have auditory hallucinations at times, which critical, demeaning, but does not endorse command hallucinations. Denies medication side effects.  Objective: I have discussed case with treatment team and have met with patient. As above, remains depressed, but some improvement noted, insofar as increased group /milieu participation/attendance, and improving eye contact. As above, reports ongoing hallucinations, at this time not internally preoccupied and does not present paranoid or guarded . No  medication side effects Appreciate hospitalist follow up and medication adjustments. Main stressor for patient is homelessness, states " I don't know where I am going to go" after discharge .      Principal Problem: MDD (major depressive disorder), recurrent, severe, with psychosis ([place]) Diagnosis:   Patient Active Problem List   Diagnosis Date Noted  . MDD (major depressive disorder), recurrent, severe, with psychosis ([place]) [F33.3] 10/07/2015  . OSA (obstructive sleep apnea) [G47.33] 06/09/2015  . Vitamin D deficiency [E55.9] 01/06/2015  . Bilateral knee pain [M25.561, M25.562] 01/05/2015  . Numbness and tingling in right hand [R20.2] 01/05/2015  . Insomnia [G47.00] 12/07/2014  . Right-sided lacunar infarction ([place]) [I63.9] 09/15/2014  . Left hemiparesis ([place]) [G81.94] 09/15/2014  . Dyspnea [R06.00]   . Back pain [M54.9] 09/11/2013  . Psychoactive substance-induced organic mood disorder ([place]) [W26.37, F06.30] 05/28/2013  .  Cocaine abuse with cocaine-induced mood disorder ([place]) [F14.14] 05/28/2013  . Cannabis dependence with cannabis-induced anxiety disorder ([place]) [F12.280] 05/28/2013  . Morbid obesity with BMI of 45.0-49.9, adult ([place]) [E66.01, Z68.42] 04/25/2012  . Chest pain [R07.9] 04/17/2012  . Hypertension [I10] 04/17/2012  . Diabetes mellitus type 2 with complications, uncontrolled ([place]) [E11.8, E11.65] 04/17/2012  . Hyperlipidemia [E78.5] 04/17/2012  . Tobacco use [Z72.0] 04/17/2012   Total Time spent with patient:  20 minutes   Past Psychiatric History: SEE ABOVE  Past Medical History:  Past Medical History  Diagnosis Date  . Hypertension   . Anginal pain ([place])   . High cholesterol   . Shortness of breath 04/17/2012    "all the time"  . Headache(784.0) 04/17/2012    "~ qod; lately waking up in am w/one"  . Migraines 04/17/2012  . Chronic lower back pain 04/17/2012    "just got over some; catched when I walked"  . Depression   . Anxiety   . Sleep apnea   . Obesity   . Stroke ([place])   . Type II diabetes mellitus ([place]) 08/28/2002  . COPD (chronic obstructive pulmonary disease) [facility])     Past Surgical History  Procedure Laterality Date  . Cardiac catheterization  ~ 2011   Family History:  Family History  Problem Relation Age of Onset  . Stroke Brother 30  . Stroke Brother 79  . Heart attack Mother 26  . Stroke Father 61   Family Psychiatric  History: SEE Above Social History:  History  Alcohol Use No    Comment: rare     History  Drug Use  . Yes  . Special: Marijuana, "Crack" cocaine    Social History   Social History  . Marital Status: Single  Spouse Name: N/A  . Number of Children: N/A  . Years of Education: N/A   Social History Main Topics  . Smoking status: Current Every Day Smoker -- 0.50 packs/day for 10 years    Types: Cigarettes    Last Attempt to Quit: 12/07/2014  . Smokeless tobacco: Never Used  . Alcohol Use: No     Comment: rare  . Drug Use: Yes     Special: Marijuana, "Crack" cocaine  . Sexual Activity: Not Currently   Other Topics Concern  . 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   Additional Social History:   Sleep:  Improved   Appetite:  Fair  Current Medications: Current Facility-Administered Medications  Medication Dose Route Frequency Provider Last Rate Last Dose  . acetaminophen (TYLENOL) tablet 650 mg  650 mg Oral Q6H PRN Harriet Butte, NP   650 mg at 10/12/15 0816  . alum & mag hydroxide-simeth (MAALOX/MYLANTA) 200-200-20 MG/5ML suspension 30 mL  30 mL Oral Q4H PRN Harriet Butte, NP      . amLODipine (NORVASC) tablet 10 mg  10 mg Oral Daily Harriet Butte, NP   10 mg at 10/14/15 0751  . [START ON 10/15/2015] ARIPiprazole (ABILIFY) tablet 10 mg  10 mg Oral Daily Jenne Campus, MD      . aspirin EC tablet 325 mg  325 mg Oral Daily Bonnielee Haff, MD   325 mg at 10/14/15 0751  . atorvastatin (LIPITOR) tablet 40 mg  40 mg Oral q1800 Harriet Butte, NP   40 mg at 10/13/15 1807  . cyclobenzaprine (FLEXERIL) tablet 5 mg  5 mg Oral TID PRN Derrill Center, NP   5 mg at 10/13/15 2107  . diclofenac sodium (VOLTAREN) 1 % transdermal gel 2 g  2 g Topical TID WC Harriet Butte, NP   2 g at 10/14/15 1153  . hydrALAZINE (APRESOLINE) tablet 25 mg  25 mg Oral QID Geradine Girt, DO   25 mg at 10/14/15 1152  . insulin aspart (novoLOG) injection 0-20 Units  0-20 Units Subcutaneous TID WC Kerrie Buffalo, NP   4 Units at 10/14/15 1153  . insulin glargine (LANTUS) injection 12 Units  12 Units Subcutaneous Daily Geradine Girt, DO   12 Units at 10/14/15 0755  . lisinopril (PRINIVIL,ZESTRIL) tablet 40 mg  40 mg Oral Daily Jenne Campus, MD   40 mg at 10/14/15 0751  . magnesium hydroxide (MILK OF MAGNESIA) suspension 30 mL  30 mL Oral Daily PRN Harriet Butte, NP      . mirtazapine (REMERON) tablet 15 mg  15 mg Oral QHS Rimsha Trembley A Rolla Servidio, MD      . ondansetron (ZOFRAN-ODT)  disintegrating tablet 4 mg  4 mg Oral Q8H PRN Kerrie Buffalo, NP   4 mg at 10/13/15 1021  . pantoprazole (PROTONIX) EC tablet 40 mg  40 mg Oral Daily Harriet Butte, NP   40 mg at 10/14/15 0751  . sertraline (ZOLOFT) tablet 150 mg  150 mg Oral Daily Jenne Campus, MD   150 mg at 10/14/15 0751  . Vitamin D (Ergocalciferol) (DRISDOL) capsule 50,000 Units  50,000 Units Oral Q7 days Harriet Butte, NP   50,000 Units at 10/08/15 1333    Lab Results:  Results for orders placed or performed during the hospital encounter of 10/07/15 (from the past 48 hour(s))  Glucose, capillary  Status: Abnormal   Collection Time: 10/12/15  5:11 PM  Result Value Ref Range   Glucose-Capillary 308 (H) 65 - 99 mg/dL  Glucose, capillary     Status: Abnormal   Collection Time: 10/12/15  8:42 PM  Result Value Ref Range   Glucose-Capillary 254 (H) 65 - 99 mg/dL  Glucose, capillary     Status: Abnormal   Collection Time: 10/13/15  6:06 AM  Result Value Ref Range   Glucose-Capillary 181 (H) 65 - 99 mg/dL  Basic metabolic panel     Status: Abnormal   Collection Time: 10/13/15  6:45 AM  Result Value Ref Range   Sodium 139 135 - 145 mmol/L   Potassium 3.4 (L) 3.5 - 5.1 mmol/L   Chloride 100 (L) 101 - 111 mmol/L   CO2 30 22 - 32 mmol/L   Glucose, Bld 195 (H) 65 - 99 mg/dL   BUN 10 6 - 20 mg/dL   Creatinine, Ser 0.96 0.44 - 1.00 mg/dL   Calcium 9.4 8.9 - 10.3 mg/dL   GFR calc non Af Amer >60 >60 mL/min   GFR calc Af Amer >60 >60 mL/min    Comment: (NOTE) The eGFR has been calculated using the CKD EPI equation. This calculation has not been validated in all clinical situations. eGFR's persistently <60 mL/min signify possible Chronic Kidney Disease.    Anion gap 9 5 - 15    Comment: Performed at [facility]  Magnesium     Status: None   Collection Time: 10/13/15  6:45 AM  Result Value Ref Range   Magnesium 1.9 1.7 - 2.4 mg/dL    Comment: Performed at [facility]   Glucose, capillary     Status: Abnormal   Collection Time: 10/13/15  9:00 AM  Result Value Ref Range   Glucose-Capillary 179 (H) 65 - 99 mg/dL   Comment 1 Notify RN    Comment 2 Document in Chart   Glucose, capillary     Status: Abnormal   Collection Time: 10/13/15 11:52 AM  Result Value Ref Range   Glucose-Capillary 193 (H) 65 - 99 mg/dL   Comment 1 Notify RN    Comment 2 Document in Chart   Glucose, capillary     Status: Abnormal   Collection Time: 10/13/15  4:57 PM  Result Value Ref Range   Glucose-Capillary 286 (H) 65 - 99 mg/dL   Comment 1 Notify RN    Comment 2 Document in Chart   Glucose, capillary     Status: Abnormal   Collection Time: 10/13/15  8:25 PM  Result Value Ref Range   Glucose-Capillary 201 (H) 65 - 99 mg/dL  Glucose, capillary     Status: Abnormal   Collection Time: 10/14/15  6:13 AM  Result Value Ref Range   Glucose-Capillary 195 (H) 65 - 99 mg/dL  Glucose, capillary     Status: Abnormal   Collection Time: 10/14/15 11:51 AM  Result Value Ref Range   Glucose-Capillary 189 (H) 65 - 99 mg/dL   Comment 1 Notify RN     Physical Findings: AIMS: Facial and Oral Movements Muscles of Facial Expression: None, normal Lips and Perioral Area: None, normal Jaw: None, normal Tongue: None, normal,Extremity Movements Upper (arms, wrists, hands, fingers): None, normal Lower (legs, knees, ankles, toes): None, normal, Trunk Movements Neck, shoulders, hips: None, normal, Overall Severity Severity of abnormal movements (highest score from questions above): None, normal Incapacitation due to abnormal movements: None, normal Patient's awareness of abnormal  movements (rate only patient's report): No Awareness, Dental Status Current problems with teeth and/or dentures?: No Does patient usually wear dentures?: No  CIWA:  CIWA-Ar Total: 0 COWS:     Musculoskeletal: Strength & Muscle Tone: within normal limits Gait & Station: normal patient ambulates with a  walker. Patient leans: N/A  Psychiatric Specialty Exam: Review of Systems  Psychiatric/Behavioral: Positive for depression and suicidal ideas. The patient is nervous/anxious and has insomnia.   All other systems reviewed and are negative. denies chest pain, no shortness of breath, hemiparesia ( stable ) S/P CVA one year ago , nausea , vomiting x 1.   Blood pressure 137/92, pulse 99, temperature 97.8 F (36.6 C), temperature source Oral, resp. rate 18, height _0  (1.651 m), weight 251 lb (113.853 kg), last menstrual period 10/03/2015, SpO2 99 %.Body mass index is 41.77 kg/(m^2).  General Appearance:  Fairly groomed   Engineer, water::  Good  Speech:  Clear and Coherent  Volume:  Decreased   Mood:  Depressed   Affect:  Constricted , but slightly more reactive today  Thought Process:   linear  Orientation:  Full (Time, Place, and Person)  Thought Content:  Describes ongoing auditory hallucinations, no delusions expressed, not internally preoccupied at this time  Suicidal Thoughts: continues to report passive SI, does not endorse plan or intention of suicide at this time   Homicidal Thoughts:  No  Memory:   Recent and remote grossly intact   Judgement:  Fair  Insight:  Fair  Psychomotor Activity:  Still decreased but less isolated, spending more time in milieu  Concentration:  Fair  Recall:  [employer] of Knowledge:Fair  Language: Good  Akathisia:  NA  Handed:  Right  AIMS (if indicated):     Assets:  Communication Skills Desire for Improvement Financial Resources/Insurance Social Support  ADL's:  Intact  Cognition: WNL  Sleep:  Number of Hours: 5.75   Assessment - patient continues to present with depression, sadness, passive SI, and reports ongoing hallucinations. Does report modest improvement and is noted to be more visible in milieu, and to present with a slightly more reactive affect. Tolerating medications well .   Treatment Plan Summary: Daily contact with patient to  assess and evaluate symptoms and progress in treatment and Medication management Continue  Zoloft  150   mgrs QDAY for depression and anxiety Increase Abilify to 10  mg QDAY for mood disorder/ augmentation  Continue  Remeron at 15  mgrs QHS for depression and insomnia  Continue Flexeril 30m  TID PRN for muscular spasms Continue antihypertensive regimen for HTN, as adjusted by Hospitalist service    CNeita Garnet MD 10/14/2015, 2:11 PM

## 2015-10-15 LAB — GLUCOSE, CAPILLARY
GLUCOSE-CAPILLARY: 152 mg/dL — AB (ref 65–99)
GLUCOSE-CAPILLARY: 284 mg/dL — AB (ref 65–99)

## 2015-10-15 MED ORDER — ARIPIPRAZOLE 15 MG PO TABS
15.0000 mg | ORAL_TABLET | Freq: Every day | ORAL | Status: DC
  Administered 2015-10-16 – 2015-10-21 (×6): 15 mg via ORAL
  Filled 2015-10-15 (×3): qty 1
  Filled 2015-10-15: qty 7
  Filled 2015-10-15 (×5): qty 1

## 2015-10-15 NOTE — [facility] Group Notes (Signed)
Adult Psychoeducational Group Note  Date:  10/15/2015 Time:  9:08 PM  Group Topic/Focus:  Wrap-Up Group:   The focus of this group is to help patients review their daily goal of treatment and discuss progress on daily workbooks.  Participation Level:  Minimal  Participation Quality:  Appropriate  Affect:  Appropriate  Cognitive:  Appropriate  Insight: Good  Engagement in Group:  Limited  Modes of Intervention:  Discussion  Additional Comments:  Pt rated her day a 5.  She stated she went to a few groups and her goal was to make it to groups.  She doesn't have any discharge plans yet.  Diamond Rice A 10/15/2015, 9:08 PM

## 2015-10-15 NOTE — Progress Notes (Signed)
Inpatient Diabetes Program Recommendations  AACE/ADA: New Consensus Statement on Inpatient Glycemic Control (2015)  Target Ranges:  Prepandial:   less than 140 mg/dL      Peak postprandial:   less than 180 mg/dL (1-2 hours)      Critically ill patients:  140 - 180 mg/dL   Review of Glycemic Control  Results for MARIGRACE, BEATRICE (MRN [id number]) as of 10/15/2015 10:54  Ref. Range 10/14/2015 06:13 10/14/2015 11:51 10/14/2015 16:44 10/14/2015 20:24 10/15/2015 06:07  Glucose-Capillary Latest Ref Range: 65-99 mg/dL 195 (H) 189 (H) 312 (H) 215 (H) 152 (H)    Post-prandial blood sugars elevated. FBS improved from addition of Lantus. Needs meal coverage insulin.  Inpatient Diabetes Program Recommendations:  Add Novolog 3 units tidwc for meal coverage insulin.  Will continue to follow. Thank you. Lorenda Peck, RD, LDN, CDE Inpatient Diabetes Coordinator [phone]

## 2015-10-15 NOTE — Progress Notes (Signed)
Recreation Therapy Notes   Date: 02.17.2017 Time: 9:30am Location: [address] Group Room   Group Topic: Stress Management  Goal Area(s) Addresses:  Patient will actively participate in stress management techniques presented during session.   Behavioral Response: Did not attend.   Johneric Mcfadden L Stanley Helmuth, LRT/CTRS         Johnathan Heskett L 10/15/2015 2:59 PM 

## 2015-10-15 NOTE — Progress Notes (Signed)
D: Patient denies HI and A/V hallucinations. Patient states she has some SI, but contracts while she is here. Patient has a very flat affect and is isolative and guarded. Patient does respond positively when encouraged to leave room to go to group and meals, although patient remains quiet and isolative even when out in the milieu.  A: Patient is given medications as scheduled and prn. 15 minute checks continue for safety. R: Patient remains safe. Patient verbalizes understanding to let staff know if she feels she can no longer contract.  Destini Cambre, Thornton Dales, RN

## 2015-10-15 NOTE — Progress Notes (Signed)
[facility] referral made as patient continues to endorse high levels of depression and SI.  Tilden Fossa, [place] Clinical Social Worker [facility] [phone]

## 2015-10-15 NOTE — Progress Notes (Signed)
Patient ID: Diamond Rice, female   DOB: 07-12-1971, 45 y.o.   MRN: [id number] [facility] MD Progress Note  10/15/2015 1:02 PM [name]  MRN:  [id number] Subjective:  She reports ongoing depression, sadness, and remains ruminative about homelessness, and decreased functional ability following her CVA . Earlier today complained of headache, but it resolved later in AM.  Denies medication side effects.  Objective: I have discussed case with treatment team and have met with patient. Patient continues to present with significant depression and a constricted affect, continues to report subjective sense of hopelessness, although seems somewhat more future oriented, and was able to discuss possibility that [facility] program may help her . Also staff reports indicate that she is presenting with a slightly improved range of affect in groups.  Does tend to isolate in room, but milieu participation has increased compared to admission. Auditory hallucinations have decreased , but persist- does not appear internally preoccupied at this time. Tolerating medications well, denies side effects.        Principal Problem: MDD (major depressive disorder), recurrent, severe, with psychosis ([place]) Diagnosis:   Patient Active Problem List   Diagnosis Date Noted  . MDD (major depressive disorder), recurrent, severe, with psychosis ([place]) [F33.3] 10/07/2015  . OSA (obstructive sleep apnea) [G47.33] 06/09/2015  . Vitamin D deficiency [E55.9] 01/06/2015  . Bilateral knee pain [M25.561, M25.562] 01/05/2015  . Numbness and tingling in right hand [R20.2] 01/05/2015  . Insomnia [G47.00] 12/07/2014  . Right-sided lacunar infarction ([place]) [I63.9] 09/15/2014  . Left hemiparesis ([place]) [G81.94] 09/15/2014  . Dyspnea [R06.00]   . Back pain [M54.9] 09/11/2013  . Psychoactive substance-induced organic mood disorder ([place]) [I43.32, F06.30] 05/28/2013  . Cocaine abuse with cocaine-induced mood disorder ([place]) [F14.14] 05/28/2013   . Cannabis dependence with cannabis-induced anxiety disorder ([place]) [F12.280] 05/28/2013  . Morbid obesity with BMI of 45.0-49.9, adult ([place]) [E66.01, Z68.42] 04/25/2012  . Chest pain [R07.9] 04/17/2012  . Hypertension [I10] 04/17/2012  . Diabetes mellitus type 2 with complications, uncontrolled ([place]) [E11.8, E11.65] 04/17/2012  . Hyperlipidemia [E78.5] 04/17/2012  . Tobacco use [Z72.0] 04/17/2012   Total Time spent with patient:  20 minutes   Past Psychiatric History: SEE ABOVE  Past Medical History:  Past Medical History  Diagnosis Date  . Hypertension   . Anginal pain ([place])   . High cholesterol   . Shortness of breath 04/17/2012    "all the time"  . Headache(784.0) 04/17/2012    "~ qod; lately waking up in am w/one"  . Migraines 04/17/2012  . Chronic lower back pain 04/17/2012    "just got over some; catched when I walked"  . Depression   . Anxiety   . Sleep apnea   . Obesity   . Stroke ([place])   . Type II diabetes mellitus ([place]) 08/28/2002  . COPD (chronic obstructive pulmonary disease) [facility])     Past Surgical History  Procedure Laterality Date  . Cardiac catheterization  ~ 2011   Family History:  Family History  Problem Relation Age of Onset  . Stroke Brother 6  . Stroke Brother 101  . Heart attack Mother 60  . Stroke Father 54   Family Psychiatric  History: SEE Above Social History:  History  Alcohol Use No    Comment: rare     History  Drug Use  . Yes  . Special: Marijuana, "Crack" cocaine    Social History   Social History  . Marital Status: Single    Spouse Name: N/A  .  Number of Children: N/A  . Years of Education: N/A   Social History Main Topics  . Smoking status: Current Every Day Smoker -- 0.50 packs/day for 10 years    Types: Cigarettes    Last Attempt to Quit: 12/07/2014  . Smokeless tobacco: Never Used  . Alcohol Use: No     Comment: rare  . Drug Use: Yes    Special: Marijuana, "Crack" cocaine  . Sexual Activity: Not Currently    Other Topics Concern  . 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   Additional Social History:   Sleep:  Improved   Appetite:  Fair  Current Medications: Current Facility-Administered Medications  Medication Dose Route Frequency Provider Last Rate Last Dose  . acetaminophen (TYLENOL) tablet 650 mg  650 mg Oral Q6H PRN Harriet Butte, NP   650 mg at 10/12/15 0816  . alum & mag hydroxide-simeth (MAALOX/MYLANTA) 200-200-20 MG/5ML suspension 30 mL  30 mL Oral Q4H PRN Harriet Butte, NP      . amLODipine (NORVASC) tablet 10 mg  10 mg Oral Daily Harriet Butte, NP   10 mg at 10/15/15 0842  . ARIPiprazole (ABILIFY) tablet 10 mg  10 mg Oral Daily Jenne Campus, MD   10 mg at 10/15/15 0843  . aspirin EC tablet 325 mg  325 mg Oral Daily Bonnielee Haff, MD   325 mg at 10/15/15 0843  . atorvastatin (LIPITOR) tablet 40 mg  40 mg Oral q1800 Harriet Butte, NP   40 mg at 10/14/15 1710  . cyclobenzaprine (FLEXERIL) tablet 5 mg  5 mg Oral TID PRN Derrill Center, NP   5 mg at 10/15/15 0849  . diclofenac sodium (VOLTAREN) 1 % transdermal gel 2 g  2 g Topical TID WC Harriet Butte, NP   2 g at 10/15/15 0842  . hydrALAZINE (APRESOLINE) tablet 25 mg  25 mg Oral QID Geradine Girt, DO   25 mg at 10/15/15 1207  . insulin aspart (novoLOG) injection 0-20 Units  0-20 Units Subcutaneous TID WC Kerrie Buffalo, NP   Stopped at 10/15/15 0645  . insulin glargine (LANTUS) injection 12 Units  12 Units Subcutaneous Daily Geradine Girt, DO   12 Units at 10/15/15 0846  . lisinopril (PRINIVIL,ZESTRIL) tablet 40 mg  40 mg Oral Daily Jenne Campus, MD   40 mg at 10/15/15 0843  . magnesium hydroxide (MILK OF MAGNESIA) suspension 30 mL  30 mL Oral Daily PRN Harriet Butte, NP      . mirtazapine (REMERON) tablet 15 mg  15 mg Oral QHS Jenne Campus, MD   15 mg at 10/14/15 2125  . ondansetron (ZOFRAN-ODT) disintegrating tablet 4 mg  4 mg Oral Q8H PRN Kerrie Buffalo, NP   4 mg at 10/15/15 2505  . pantoprazole (PROTONIX) EC tablet 40 mg  40 mg Oral Daily Harriet Butte, NP   40 mg at 10/15/15 0843  . sertraline (ZOLOFT) tablet 150 mg  150 mg Oral Daily Jenne Campus, MD   150 mg at 10/15/15 0842  . Vitamin D (Ergocalciferol) (DRISDOL) capsule 50,000 Units  50,000 Units Oral Q7 days Harriet Butte, NP   50,000 Units at 10/15/15 3976    Lab Results:  Results for orders placed or performed during the hospital encounter of 10/07/15 (from the past 48 hour(s))  Glucose, capillary     Status: Abnormal  Collection Time: 10/13/15  4:57 PM  Result Value Ref Range   Glucose-Capillary 286 (H) 65 - 99 mg/dL   Comment 1 Notify RN    Comment 2 Document in Chart   Glucose, capillary     Status: Abnormal   Collection Time: 10/13/15  8:25 PM  Result Value Ref Range   Glucose-Capillary 201 (H) 65 - 99 mg/dL  Glucose, capillary     Status: Abnormal   Collection Time: 10/14/15  6:13 AM  Result Value Ref Range   Glucose-Capillary 195 (H) 65 - 99 mg/dL  Glucose, capillary     Status: Abnormal   Collection Time: 10/14/15 11:51 AM  Result Value Ref Range   Glucose-Capillary 189 (H) 65 - 99 mg/dL   Comment 1 Notify RN   Glucose, capillary     Status: Abnormal   Collection Time: 10/14/15  4:44 PM  Result Value Ref Range   Glucose-Capillary 312 (H) 65 - 99 mg/dL   Comment 1 Notify RN   Glucose, capillary     Status: Abnormal   Collection Time: 10/14/15  8:24 PM  Result Value Ref Range   Glucose-Capillary 215 (H) 65 - 99 mg/dL  Glucose, capillary     Status: Abnormal   Collection Time: 10/15/15  6:07 AM  Result Value Ref Range   Glucose-Capillary 152 (H) 65 - 99 mg/dL   Comment 1 Notify RN    Comment 2 Document in Chart     Physical Findings: AIMS: Facial and Oral Movements Muscles of Facial Expression: None, normal Lips and Perioral Area: None, normal Jaw: None, normal Tongue: None, normal,Extremity Movements Upper (arms, wrists, hands,  fingers): None, normal Lower (legs, knees, ankles, toes): None, normal, Trunk Movements Neck, shoulders, hips: None, normal, Overall Severity Severity of abnormal movements (highest score from questions above): None, normal Incapacitation due to abnormal movements: None, normal Patient's awareness of abnormal movements (rate only patient's report): No Awareness, Dental Status Current problems with teeth and/or dentures?: No Does patient usually wear dentures?: No  CIWA:  CIWA-Ar Total: 0 COWS:     Musculoskeletal: Strength & Muscle Tone: within normal limits Gait & Station: normal patient ambulates with a walker. Patient leans: N/A  Psychiatric Specialty Exam: Review of Systems  Psychiatric/Behavioral: Positive for depression and suicidal ideas. The patient is nervous/anxious and has insomnia.   All other systems reviewed and are negative. denies chest pain, no shortness of breath, hemiparesia ( stable ) S/P CVA one year ago , nausea ,  No further episodes of vomiting   Blood pressure 137/80, pulse 105, temperature 98.3 F (36.8 C), temperature source Oral, resp. rate 18, height '5\' 5"'$  (1.651 m), weight 251 lb (113.853 kg), last menstrual period 10/03/2015, SpO2 99 %.Body mass index is 41.77 kg/(m^2).  General Appearance:  Fairly groomed   Engineer, water::  Good  Speech:  Clear and Coherent  Volume:  Decreased   Mood:  Depressed   Affect:  Constricted , but gradually becoming more reactive   Thought Process:   linear  Orientation:  Full (Time, Place, and Person)  Thought Content:  Describes ongoing auditory hallucinations, critical in nature, denies command hallucinations, does not appear internally preoccupied, no delusions expressed   Suicidal Thoughts: continues to report passive SI, but contracts for safety on unit  Homicidal Thoughts:  No  Memory:   Recent and remote grossly intact   Judgement:  Fair  Insight:  Fair  Psychomotor Activity:  Still decreased but less isolated,  spending more time in  milieu  Concentration:  Fair  Recall:  [place]  Language: Good  Akathisia:  NA  Handed:  Right  AIMS (if indicated):     Assets:  Communication Skills Desire for Improvement Financial Resources/Insurance Social Support  ADL's:  Intact  Cognition: WNL  Sleep:  Number of Hours: 6.75   Assessment - patient remains depressed, sad, constricted in affect, although milieu participation gradually improving , and report is that her affect is more reactive at times. Passive SI, but contracts for safety. Auditory hallucinations are decreased but persist. Tolerating medications well .  Treatment Plan Summary: Daily contact with patient to assess and evaluate symptoms and progress in treatment and Medication management Continue  Zoloft  150   mgrs QDAY for depression and anxiety Increase Abilify to 15 mgrs QDAY for mood disorder/ augmentation  Continue  Remeron  15  mgrs QHS for depression and insomnia  Continue Flexeril '5mg'$   TID PRN for muscular spasms Continue antihypertensive regimen for HTN, as adjusted by Hospitalist service  Treatment team working on disposition plans .   Neita Garnet, MD 10/15/2015, 1:02 PM

## 2015-10-15 NOTE — [facility] Group Notes (Signed)
[place] LCSW Group Therapy  10/15/2015 3:13 PM  Type of Therapy:  Group Therapy  Participation Level:  Minimal  Participation Quality:  Attentive  Affect:  Depressed and Flat  Cognitive:  Alert  Insight:  Limited  Engagement in Therapy:  Limited  Modes of Intervention:  Confrontation, Discussion, Education, Exploration, Problem-solving, Rapport Building, Socialization and Support  Summary of Progress/Problems: Feelings around Relapse. Group members discussed the meaning of relapse and shared personal stories of relapse, how it affected them and others, and how they perceived themselves during this time. Group members were encouraged to identify triggers, warning signs and coping skills used when facing the possibility of relapse. Social supports were discussed and explored in detail. Post Acute Withdrawal Syndrome (handout provided) was introduced and examined. Pt's were encouraged to ask questions, talk about key points associated with PAWS, and process this information in terms of relapse prevention. Diamond Rice was attentive during group but did not actively participate in group discussion unless called upon directly by CSW. She shared that isolation and lack of support are two major triggers for her mental health relapse. She reports that she hopes to find support in the [place] brightened when another group member stated that she enjoys when Diamond Rice comes out of her room and attends group. She continues to make some progress in the group setting but presents with depressed mood/flat affect.   Smart, Seriyah Collison LCSW 10/15/2015, 3:13 PM

## 2015-10-16 LAB — GLUCOSE, CAPILLARY
GLUCOSE-CAPILLARY: 198 mg/dL — AB (ref 65–99)
GLUCOSE-CAPILLARY: 280 mg/dL — AB (ref 65–99)
Glucose-Capillary: 179 mg/dL — ABNORMAL HIGH (ref 65–99)
Glucose-Capillary: 218 mg/dL — ABNORMAL HIGH (ref 65–99)
Glucose-Capillary: 179 mg/dL — ABNORMAL HIGH (ref 65–99)
Glucose-Capillary: 228 mg/dL — ABNORMAL HIGH (ref 65–99)

## 2015-10-16 MED ORDER — HYDROXYZINE HCL 25 MG PO TABS
25.0000 mg | ORAL_TABLET | Freq: Three times a day (TID) | ORAL | Status: DC | PRN
  Administered 2015-10-17 – 2015-10-20 (×4): 25 mg via ORAL
  Filled 2015-10-16 (×2): qty 1
  Filled 2015-10-16: qty 10
  Filled 2015-10-16 (×2): qty 1

## 2015-10-16 NOTE — Progress Notes (Signed)
Patient ID: Diamond Rice, female   DOB: 1970-10-05, 45 y.o.   MRN: [id number]   D: Pt has been very flat and depressed on the unit today. Pt reported that her depression was a 8, her hopelessness was a 8, and her anxiety was a 8. Pt reported that her energy was low and that she did not sleep well last night. Pt reported that she was positive SI, but could contract for safety. Pt reported being negative HI, no AH/VH noted. A: 15 min checks continued for patient safety. R: Pt safety maintained.

## 2015-10-16 NOTE — Plan of Care (Signed)
Problem: Alteration in mood & ability to function due to Goal: LTG-Pt reports reduction in suicidal thoughts (Patient reports reduction in suicidal thoughts and is able to verbalize a safety plan for whenever patient is feeling suicidal)  Outcome: Not Progressing Si- contracts for safety.

## 2015-10-16 NOTE — Progress Notes (Signed)
Group Note  Pt attended wrap-up group; participated actively. Appropriate behavior. 

## 2015-10-16 NOTE — Progress Notes (Signed)
[facility] MD Progress Note  10/16/2015 11:12 AM Diamond Rice  MRN:  [id number] Subjective:  She reports " I am always depressed 8/10"  Objective: Diamond Rice is awake, alert and oriented X3 , found resting in bedroom.  Denies suicidal or homicidal ideation. Reports auditory hallucination that's on going and has minimized a bit. She denies visual hallucination and does not appear to be responding to internal stimuli.  Patient reports she is medication compliant without mediation side effects. Stats that she hasn't been attending many groups, due to its "the same information as before." States her depression 8/10. Patient states "I am always feeling depressed. I am really tired today."  Reports she is looking forward to discharge with [facility] and not he rescue mission in [place]. Reports good appetite  and resting well. Support, encouragement and reassurance was provided.       Principal Problem: MDD (major depressive disorder), recurrent, severe, with psychosis (Diamond Rice) Diagnosis:   Patient Active Problem List   Diagnosis Date Noted  . MDD (major depressive disorder), recurrent, severe, with psychosis ([place]) [F33.3] 10/07/2015  . OSA (obstructive sleep apnea) [G47.33] 06/09/2015  . Vitamin D deficiency [E55.9] 01/06/2015  . Bilateral knee pain [M25.561, M25.562] 01/05/2015  . Numbness and tingling in right hand [R20.2] 01/05/2015  . Insomnia [G47.00] 12/07/2014  . Right-sided lacunar infarction ([place]) [I63.9] 09/15/2014  . Left hemiparesis ([place]) [G81.94] 09/15/2014  . Dyspnea [R06.00]   . Back pain [M54.9] 09/11/2013  . Psychoactive substance-induced organic mood disorder ([place]) [id number], F06.30] 05/28/2013  . Cocaine abuse with cocaine-induced mood disorder ([place]) [F14.14] 05/28/2013  . Cannabis dependence with cannabis-induced anxiety disorder ([place]) [F12.280] 05/28/2013  . Morbid obesity with BMI of 45.0-49.9, adult ([place]) [E66.01, Z68.42] 04/25/2012  . Chest pain [R07.9] 04/17/2012  . Hypertension  [I10] 04/17/2012  . Diabetes mellitus type 2 with complications, uncontrolled ([place]) [E11.8, E11.65] 04/17/2012  . Hyperlipidemia [E78.5] 04/17/2012  . Tobacco use [Z72.0] 04/17/2012   Total Time spent with patient:  20 minutes   Past Psychiatric History: SEE ABOVE  Past Medical History:  Past Medical History  Diagnosis Date  . Hypertension   . Anginal pain ([place])   . High cholesterol   . Shortness of breath 04/17/2012    "all the time"  . Headache(784.0) 04/17/2012    "~ qod; lately waking up in am w/one"  . Migraines 04/17/2012  . Chronic lower back pain 04/17/2012    "just got over some; catched when I walked"  . Depression   . Anxiety   . Sleep apnea   . Obesity   . Stroke ([place])   . Type II diabetes mellitus ([place]) 08/28/2002  . COPD (chronic obstructive pulmonary disease) [facility])     Past Surgical History  Procedure Laterality Date  . Cardiac catheterization  ~ 2011   Family History:  Family History  Problem Relation Age of Onset  . Stroke Brother 5  . Stroke Brother 62  . Heart attack Mother 17  . Stroke Father 58   Family Psychiatric  History: SEE Above Social History:  History  Alcohol Use No    Comment: rare     History  Drug Use  . Yes  . Special: Marijuana, "Crack" cocaine    Social History   Social History  . Marital Status: Single    Spouse Name: N/A  . Number of Children: N/A  . Years of Education: N/A   Social History Main Topics  . Smoking status: Current Every Day Smoker -- 0.50 packs/day  for 10 years    Types: Cigarettes    Last Attempt to Quit: 12/07/2014  . Smokeless tobacco: Never Used  . Alcohol Use: No     Comment: rare  . Drug Use: Yes    Special: Marijuana, "Crack" cocaine  . Sexual Activity: Not Currently   Other Topics Concern  . 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   Additional Social History:   Sleep:  Improved   Appetite:  Fair  Current Medications: Current  Facility-Administered Medications  Medication Dose Route Frequency Provider Last Rate Last Dose  . acetaminophen (TYLENOL) tablet 650 mg  650 mg Oral Q6H PRN Harriet Butte, NP   650 mg at 10/12/15 0816  . alum & mag hydroxide-simeth (MAALOX/MYLANTA) 200-200-20 MG/5ML suspension 30 mL  30 mL Oral Q4H PRN Harriet Butte, NP      . amLODipine (NORVASC) tablet 10 mg  10 mg Oral Daily Harriet Butte, NP   10 mg at 10/16/15 0836  . ARIPiprazole (ABILIFY) tablet 15 mg  15 mg Oral Daily Jenne Campus, MD   15 mg at 10/16/15 0836  . aspirin EC tablet 325 mg  325 mg Oral Daily Bonnielee Haff, MD   325 mg at 10/16/15 0836  . atorvastatin (LIPITOR) tablet 40 mg  40 mg Oral q1800 Harriet Butte, NP   40 mg at 10/15/15 1706  . cyclobenzaprine (FLEXERIL) tablet 5 mg  5 mg Oral TID PRN Derrill Center, NP   5 mg at 10/15/15 2129  . diclofenac sodium (VOLTAREN) 1 % transdermal gel 2 g  2 g Topical TID WC Harriet Butte, NP   2 g at 10/15/15 0842  . hydrALAZINE (APRESOLINE) tablet 25 mg  25 mg Oral QID Geradine Girt, DO   25 mg at 10/16/15 0836  . insulin aspart (novoLOG) injection 0-20 Units  0-20 Units Subcutaneous TID WC Kerrie Buffalo, NP   4 Units at 10/16/15 0630  . insulin glargine (LANTUS) injection 12 Units  12 Units Subcutaneous Daily Geradine Girt, DO   12 Units at 10/16/15 [phone]  . lisinopril (PRINIVIL,ZESTRIL) tablet 40 mg  40 mg Oral Daily Jenne Campus, MD   40 mg at 10/16/15 0836  . magnesium hydroxide (MILK OF MAGNESIA) suspension 30 mL  30 mL Oral Daily PRN Harriet Butte, NP      . mirtazapine (REMERON) tablet 15 mg  15 mg Oral QHS Jenne Campus, MD   15 mg at 10/15/15 2129  . ondansetron (ZOFRAN-ODT) disintegrating tablet 4 mg  4 mg Oral Q8H PRN Kerrie Buffalo, NP   4 mg at 10/15/15 [id number]  . pantoprazole (PROTONIX) EC tablet 40 mg  40 mg Oral Daily Harriet Butte, NP   40 mg at 10/16/15 0836  . sertraline (ZOLOFT) tablet 150 mg  150 mg Oral Daily Jenne Campus, MD   150 mg at  10/16/15 0836  . Vitamin D (Ergocalciferol) (DRISDOL) capsule 50,000 Units  50,000 Units Oral Q7 days Harriet Butte, NP   50,000 Units at 10/15/15 [id number]    Lab Results:  Results for orders placed or performed during the hospital encounter of 10/07/15 (from the past 48 hour(s))  Glucose, capillary     Status: Abnormal   Collection Time: 10/14/15 11:51 AM  Result Value Ref Range   Glucose-Capillary 189 (H) 65 - 99 mg/dL   Comment 1 Notify  RN   Glucose, capillary     Status: Abnormal   Collection Time: 10/14/15  4:44 PM  Result Value Ref Range   Glucose-Capillary 312 (H) 65 - 99 mg/dL   Comment 1 Notify RN   Glucose, capillary     Status: Abnormal   Collection Time: 10/14/15  8:24 PM  Result Value Ref Range   Glucose-Capillary 215 (H) 65 - 99 mg/dL  Glucose, capillary     Status: Abnormal   Collection Time: 10/15/15  6:07 AM  Result Value Ref Range   Glucose-Capillary 152 (H) 65 - 99 mg/dL   Comment 1 Notify RN    Comment 2 Document in Chart   Glucose, capillary     Status: Abnormal   Collection Time: 10/15/15 12:17 PM  Result Value Ref Range   Glucose-Capillary 179 (H) 65 - 99 mg/dL  Glucose, capillary     Status: Abnormal   Collection Time: 10/15/15  4:58 PM  Result Value Ref Range   Glucose-Capillary 218 (H) 65 - 99 mg/dL  Glucose, capillary     Status: Abnormal   Collection Time: 10/15/15  8:26 PM  Result Value Ref Range   Glucose-Capillary 284 (H) 65 - 99 mg/dL  Glucose, capillary     Status: Abnormal   Collection Time: 10/16/15  5:56 AM  Result Value Ref Range   Glucose-Capillary 179 (H) 65 - 99 mg/dL    Physical Findings: AIMS: Facial and Oral Movements Muscles of Facial Expression: None, normal Lips and Perioral Area: None, normal Jaw: None, normal Tongue: None, normal,Extremity Movements Upper (arms, wrists, hands, fingers): None, normal Lower (legs, knees, ankles, toes): None, normal, Trunk Movements Neck, shoulders, hips: None, normal, Overall  Severity Severity of abnormal movements (highest score from questions above): None, normal Incapacitation due to abnormal movements: None, normal Patient's awareness of abnormal movements (rate only patient's report): No Awareness, Dental Status Current problems with teeth and/or dentures?: No Does patient usually wear dentures?: No  CIWA:  CIWA-Ar Total: 0 COWS:     Musculoskeletal: Strength & Muscle Tone: within normal limits Gait & Station: normal patient ambulates with a walker. Patient leans: N/A  Psychiatric Specialty Exam: Review of Systems  Psychiatric/Behavioral: Positive for depression and suicidal ideas. The patient is nervous/anxious and has insomnia.   All other systems reviewed and are negative.    Blood pressure 148/92, pulse 108, temperature 98.9 F (37.2 C), temperature source Oral, resp. rate 18, height 5\' 5"  (1.651 m), weight 113.853 kg (251 lb), last menstrual period 10/03/2015, SpO2 99 %.Body mass index is 41.77 kg/(m^2).  General Appearance:  Casual, pleasant and cooperative   Eye Contact::  Good  Speech:  Clear and Coherent  Volume:  Decreased   Mood:  Depressed reports 8/10  Affect:  Constricted , but gradually becoming more reactive   Thought Process:   linear  Orientation:  Full (Time, Place, and Person)  Thought Content:  Describes ongoing auditory hallucinations, critical in nature, denies command hallucinations, does not appear internally preoccupied, no delusions expressed.   Suicidal Thoughts: continues to report passive SI, but contracts for safety on unit  Homicidal Thoughts:  No  Memory:   Recent and remote grossly intact   Judgement:  Fair  Insight:  Fair  Psychomotor Activity:  Still decreased but less isolated, spending more time in milieu  Concentration:  Fair  Recall:  [place]  Language: Good  Akathisia:  NA  Handed:  Right  AIMS (if indicated):  Assets:  Communication Skills Desire for Improvement Financial  Resources/Insurance Social Support  ADL's:  Intact  Cognition: WNL  Sleep:  Number of Hours: 6     I agree with current treatment plan on 10/16/2015, Patient seen face-to-face for psychiatric evaluation follow-up, chart reviewed. Reviewed the information documented and agree with the treatment plan.  Treatment Plan Summary: Daily contact with patient to assess and evaluate symptoms and progress in treatment and Medication management Continue  Zoloft  150   mgrs QDAY for depression and anxiety Increase Abilify to 15 mgrs QDAY for mood disorder/ augmentation  Continue  Remeron  15  mgrs QHS for depression and insomnia  Continue Flexeril 5mg   TID PRN for muscular spasms Continue antihypertensive regimen for HTN, as adjusted by Hospitalist service  Treatment team working on disposition plans .   Derrill Center, NP 10/16/2015, 11:12 AM I agreed with findings and treatment plan of this patient

## 2015-10-16 NOTE — Progress Notes (Signed)
Patient ID: Diamond Rice, female   DOB: 1971/08/21, 45 y.o.   MRN: [id number]   Type of Therapy:  Psychoeducational Skills  Participation Level:  Did Not Attend  Participation Quality:  Did Not Attend  Affect:  Did Not Attend  Cognitive:  Did Not Attend  Insight:  None  Engagement in Group:  Did Not Attend  Modes of Intervention:  Did Not Attend  Summary of Progress/Problems: Pt did not attend patient self inventory group.

## 2015-10-16 NOTE — Progress Notes (Addendum)
D: Pt SI/ AH- contracts for safety, denies HI/ VH. Pt is pleasant and cooperative. Pt stated she was doing a little better today. Pt stated she will start trying to get herself back into work flow. Pt stated she would like to find services in [place] , since all her resources are in town .   A: Pt was offered support and encouragement. Pt was given scheduled medications. Pt was encourage to attend groups. Q 15 minute checks were done for safety.   R:Pt attends groups and interacts well with peers and staff. Pt is taking medication. Pt has no complaints at this time .Pt receptive to treatment and safety maintained on unit.

## 2015-10-16 NOTE — [facility] Group Notes (Signed)
    [facility] LCSW Group Therapy  10/16/2015  10 - 11 AM  Type of Therapy:  Group Therapy  Participation Level:  None; Did not attend, despite overhead announcement of group, followed by personal knock on pt's door with encouragement to attend by LCSW  Summary of Progress/Problems: The main focus of today's process group was for the patient to identify ways in which they have in the past sabotaged their own recovery. Motivational Interviewing was utilized to identify motivation they may have for wanting to change. The Stages of Change were explained using a handout, and patients identified where they currently are with regard to stages of change.   [name], LCSW

## 2015-10-16 NOTE — Progress Notes (Signed)
Writer has observed patient up in hallway sitting by the phone when not using it. Writer spoke with her 1:1 and she reports having had a good day. She reports that she has not attended all the groups today but did attend at least 2 today. Writer inquired if she has a support system and she replied that she is not from here and her family is in [place] and has no support here.She spoke about the relationship she was in is not healthy and she will not be returning to him. She reports that her depression increased after her stroke because she has been used to being independent.She reports passive si and verbally contracts for safety, denies hi/a/v hallucinations. Support given and safety maintained on unit with 15 min checks.

## 2015-10-17 LAB — GLUCOSE, CAPILLARY
GLUCOSE-CAPILLARY: 363 mg/dL — AB (ref 65–99)
GLUCOSE-CAPILLARY: 105 mg/dL — AB (ref 65–99)
GLUCOSE-CAPILLARY: 68 mg/dL (ref 65–99)
Glucose-Capillary: 187 mg/dL — ABNORMAL HIGH (ref 65–99)
Glucose-Capillary: 207 mg/dL — ABNORMAL HIGH (ref 65–99)

## 2015-10-17 NOTE — Progress Notes (Signed)
Patient ID: Diamond Rice, female   DOB: 09-25-1970, 45 y.o.   MRN: [id number]   D: Pt has been very flat and depressed on the unit today, pt also complained of having lots of anxiety. Pt was given prn dose of Vistaril, for which she got relief. Pt reported that her depression was a 8, her hopelessness was a 8, and her anxiety was a 8. Pt reported that her energy was low and that she did not sleep well last night. Pt reported that she was positive SI, but could contract for safety. Pt reported being negative HI, no AH/VH noted. A: 15 min checks continued for patient safety. R: Pt safety maintained.

## 2015-10-17 NOTE — Progress Notes (Signed)
[facility] MD Progress Note  10/17/2015 9:26 AM Diamond Rice  MRN:  [id number] Subjective:  She reports " I am still the same as yesterday."  Patient endorses suicidal ideation and depression  Objective: Diamond Rice is awake, alert and oriented X3 , found resting in bedroom. Reports suicidal ideation without a plan. ruminative regarding current living situation. Denies homicidal ideation. Reports auditory hallucination that's on going and has minimized a bit with current medications. She denies visual hallucination and does not appear to be responding to internal stimuli.  Patient reports she is medication compliant without mediation side effects. Stats that she still hasn't  attended many groups, due to its "the same information as before." States her depression 7/10. Patient states "I am always feeling depressed. I am really tired today."  Patient reports increasing anxiety. States that she is thinking a lot about her upcoming discharge.  Reports she is looking forward to discharge with [facility] and not he rescue mission in [place]. Reports good appetite  and resting well. Support, encouragement and reassurance was provided.       Principal Problem: MDD (major depressive disorder), recurrent, severe, with psychosis ([place]) Diagnosis:   Patient Active Problem List   Diagnosis Date Noted  . MDD (major depressive disorder), recurrent, severe, with psychosis ([place]) [F33.3] 10/07/2015  . OSA (obstructive sleep apnea) [G47.33] 06/09/2015  . Vitamin D deficiency [E55.9] 01/06/2015  . Bilateral knee pain [M25.561, M25.562] 01/05/2015  . Numbness and tingling in right hand [R20.2] 01/05/2015  . Insomnia [G47.00] 12/07/2014  . Right-sided lacunar infarction ([place]) [I63.9] 09/15/2014  . Left hemiparesis ([place]) [G81.94] 09/15/2014  . Dyspnea [R06.00]   . Back pain [M54.9] 09/11/2013  . Psychoactive substance-induced organic mood disorder ([place]) [id number], F06.30] 05/28/2013  . Cocaine abuse with cocaine-induced mood  disorder ([place]) [F14.14] 05/28/2013  . Cannabis dependence with cannabis-induced anxiety disorder ([place]) [F12.280] 05/28/2013  . Morbid obesity with BMI of 45.0-49.9, adult ([place]) [E66.01, Z68.42] 04/25/2012  . Chest pain [R07.9] 04/17/2012  . Hypertension [I10] 04/17/2012  . Diabetes mellitus type 2 with complications, uncontrolled ([place]) [E11.8, E11.65] 04/17/2012  . Hyperlipidemia [E78.5] 04/17/2012  . Tobacco use [Z72.0] 04/17/2012   Total Time spent with patient:  20 minutes   Past Psychiatric History: SEE ABOVE  Past Medical History:  Past Medical History  Diagnosis Date  . Hypertension   . Anginal pain ([place])   . High cholesterol   . Shortness of breath 04/17/2012    "all the time"  . Headache(784.0) 04/17/2012    "~ qod; lately waking up in am w/one"  . Migraines 04/17/2012  . Chronic lower back pain 04/17/2012    "just got over some; catched when I walked"  . Depression   . Anxiety   . Sleep apnea   . Obesity   . Stroke ([place])   . Type II diabetes mellitus ([place]) 08/28/2002  . COPD (chronic obstructive pulmonary disease) [facility])     Past Surgical History  Procedure Laterality Date  . Cardiac catheterization  ~ 2011   Family History:  Family History  Problem Relation Age of Onset  . Stroke Brother 89  . Stroke Brother 37  . Heart attack Mother 7  . Stroke Father 108   Family Psychiatric  History: SEE Above Social History:  History  Alcohol Use No    Comment: rare     History  Drug Use  . Yes  . Special: Marijuana, "Crack" cocaine    Social History   Social History  . Marital  Status: Single    Spouse Name: N/A  . Number of Children: N/A  . Years of Education: N/A   Social History Main Topics  . Smoking status: Current Every Day Smoker -- 0.50 packs/day for 10 years    Types: Cigarettes    Last Attempt to Quit: 12/07/2014  . Smokeless tobacco: Never Used  . Alcohol Use: No     Comment: rare  . Drug Use: Yes    Special: Marijuana, "Crack" cocaine  .  Sexual Activity: Not Currently   Other Topics Concern  . 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   Additional Social History:   Sleep:  Improved   Appetite:  Fair  Current Medications: Current Facility-Administered Medications  Medication Dose Route Frequency Provider Last Rate Last Dose  . acetaminophen (TYLENOL) tablet 650 mg  650 mg Oral Q6H PRN Harriet Butte, NP   650 mg at 10/12/15 0816  . alum & mag hydroxide-simeth (MAALOX/MYLANTA) 200-200-20 MG/5ML suspension 30 mL  30 mL Oral Q4H PRN Harriet Butte, NP      . amLODipine (NORVASC) tablet 10 mg  10 mg Oral Daily Harriet Butte, NP   10 mg at 10/17/15 0804  . ARIPiprazole (ABILIFY) tablet 15 mg  15 mg Oral Daily Jenne Campus, MD   15 mg at 10/17/15 0804  . aspirin EC tablet 325 mg  325 mg Oral Daily Bonnielee Haff, MD   325 mg at 10/17/15 0804  . atorvastatin (LIPITOR) tablet 40 mg  40 mg Oral q1800 Harriet Butte, NP   40 mg at 10/16/15 1624  . cyclobenzaprine (FLEXERIL) tablet 5 mg  5 mg Oral TID PRN Derrill Center, NP   5 mg at 10/17/15 0804  . diclofenac sodium (VOLTAREN) 1 % transdermal gel 2 g  2 g Topical TID WC Harriet Butte, NP   2 g at 10/15/15 0842  . hydrALAZINE (APRESOLINE) tablet 25 mg  25 mg Oral QID Geradine Girt, DO   25 mg at 10/17/15 0804  . hydrOXYzine (ATARAX/VISTARIL) tablet 25 mg  25 mg Oral TID PRN Derrill Center, NP   25 mg at 10/17/15 0839  . insulin aspart (novoLOG) injection 0-20 Units  0-20 Units Subcutaneous TID WC Kerrie Buffalo, NP   4 Units at 10/17/15 0654  . insulin glargine (LANTUS) injection 12 Units  12 Units Subcutaneous Daily Geradine Girt, DO   12 Units at 10/17/15 0804  . lisinopril (PRINIVIL,ZESTRIL) tablet 40 mg  40 mg Oral Daily Jenne Campus, MD   40 mg at 10/17/15 0804  . magnesium hydroxide (MILK OF MAGNESIA) suspension 30 mL  30 mL Oral Daily PRN Harriet Butte, NP      . mirtazapine (REMERON) tablet 15 mg  15  mg Oral QHS Jenne Campus, MD   15 mg at 10/16/15 2117  . ondansetron (ZOFRAN-ODT) disintegrating tablet 4 mg  4 mg Oral Q8H PRN Kerrie Buffalo, NP   4 mg at 10/15/15 [id number]  . pantoprazole (PROTONIX) EC tablet 40 mg  40 mg Oral Daily Harriet Butte, NP   40 mg at 10/17/15 0804  . sertraline (ZOLOFT) tablet 150 mg  150 mg Oral Daily Jenne Campus, MD   150 mg at 10/17/15 0804  . Vitamin D (Ergocalciferol) (DRISDOL) capsule 50,000 Units  50,000 Units Oral Q7 days Harriet Butte, NP   50,000 Units  at 10/15/15 [id number]    Lab Results:  Results for orders placed or performed during the hospital encounter of 10/07/15 (from the past 48 hour(s))  Glucose, capillary     Status: Abnormal   Collection Time: 10/15/15 12:17 PM  Result Value Ref Range   Glucose-Capillary 179 (H) 65 - 99 mg/dL  Glucose, capillary     Status: Abnormal   Collection Time: 10/15/15  4:58 PM  Result Value Ref Range   Glucose-Capillary 218 (H) 65 - 99 mg/dL  Glucose, capillary     Status: Abnormal   Collection Time: 10/15/15  8:26 PM  Result Value Ref Range   Glucose-Capillary 284 (H) 65 - 99 mg/dL  Glucose, capillary     Status: Abnormal   Collection Time: 10/16/15  5:56 AM  Result Value Ref Range   Glucose-Capillary 179 (H) 65 - 99 mg/dL  Glucose, capillary     Status: Abnormal   Collection Time: 10/16/15 11:49 AM  Result Value Ref Range   Glucose-Capillary 198 (H) 65 - 99 mg/dL  Glucose, capillary     Status: Abnormal   Collection Time: 10/16/15  4:23 PM  Result Value Ref Range   Glucose-Capillary 228 (H) 65 - 99 mg/dL   Comment 1 Notify RN   Glucose, capillary     Status: Abnormal   Collection Time: 10/16/15  8:30 PM  Result Value Ref Range   Glucose-Capillary 280 (H) 65 - 99 mg/dL  Glucose, capillary     Status: Abnormal   Collection Time: 10/17/15  6:42 AM  Result Value Ref Range   Glucose-Capillary 187 (H) 65 - 99 mg/dL    Physical Findings: AIMS: Facial and Oral Movements Muscles of Facial  Expression: None, normal Lips and Perioral Area: None, normal Jaw: None, normal Tongue: None, normal,Extremity Movements Upper (arms, wrists, hands, fingers): None, normal Lower (legs, knees, ankles, toes): None, normal, Trunk Movements Neck, shoulders, hips: None, normal, Overall Severity Severity of abnormal movements (highest score from questions above): None, normal Incapacitation due to abnormal movements: None, normal Patient's awareness of abnormal movements (rate only patient's report): No Awareness, Dental Status Current problems with teeth and/or dentures?: No Does patient usually wear dentures?: No  CIWA:  CIWA-Ar Total: 0 COWS:     Musculoskeletal: Strength & Muscle Tone: within normal limits Gait & Station: normal patient ambulates with a walker. Patient leans: N/A  Psychiatric Specialty Exam: Review of Systems  Psychiatric/Behavioral: Positive for depression and suicidal ideas. The patient is nervous/anxious and has insomnia.   All other systems reviewed and are negative.    Blood pressure 145/86, pulse 110, temperature 98.6 F (37 C), temperature source Oral, resp. rate 18, height 5\' 5"  (1.651 m), weight 113.853 kg (251 lb), last menstrual period 10/03/2015, SpO2 99 %.Body mass index is 41.77 kg/(m^2).  General Appearance:   Pleasant, calm and cooperative   Eye Contact::  Good  Speech:  Clear and Coherent  Volume:  Decreased   Mood:  Depressed reports 7/10  Affect:  Constricted , but gradually becoming more reactive   Thought Process:   linear  Orientation:  Full (Time, Place, and Person)  Thought Content:  Describes ongoing auditory hallucinations, critical in nature, denies command hallucinations, does not appear internally preoccupied, no delusions expressed.   Suicidal Thoughts: continues to report passive SI, but contracts for safety on unit  Homicidal Thoughts:  No  Memory:   Recent and remote grossly intact   Judgement:  Fair  Insight:  Fair  Psychomotor  Activity:  Still decreased but less isolated, spending more time in milieu  Concentration:  Fair  Recall:  Diamond Rice  Language: Good  Akathisia:  NA  Handed:  Right  AIMS (if indicated):     Assets:  Communication Skills Desire for Improvement Financial Resources/Insurance Social Support  ADL's:  Intact  Cognition: WNL  Sleep:  Number of Hours: 6    I agree with current treatment plan on 10/17/2015, Patient seen face-to-face for psychiatric evaluation follow-up, chart reviewed. Reviewed the information documented and agree with the treatment plan.  Treatment Plan Summary:  Daily contact with patient to assess and evaluate symptoms and progress in treatment and Medication management  Start Vistaril 25 mg PO Q8H PRN for anxiety Continue  Zoloft  150   mgrs QDAY for depression and anxiety Continue  Abilify to 15 mgrs QDAY for mood disorder/ augmentation  Continue  Remeron  15  mgrs QHS for depression and insomnia  Continue Flexeril 5mg   TID PRN for muscular spasms Continue antihypertensive regimen for HTN, as adjusted by Hospitalist service  Treatment team working on disposition plans .   Derrill Center, NP 10/17/2015, 9:26 AM I agreed with findings and treatment plan of this patient

## 2015-10-17 NOTE — [facility] Group Notes (Signed)
[facility] LCSW Group Therapy Note   10/17/2015  10 AM   Type of Therapy and Topic: Group Therapy: Feelings Around Returning Home & Establishing a Supportive Framework and Activity to Identify signs of Improvement or Decompensation   Participation Level:  Did not attend although encouraged by MHT and CSW in addition to overhead announcement.   [name], LCSW

## 2015-10-17 NOTE — [facility] Group Notes (Signed)
[facility] Group Notes:  (Nursing/MHT/Case Management/Adjunct)  Date:  10/17/2015  Time:  10:33 AM  Type of Therapy:  Psychoeducational Skills  Participation Level:  Did Not Attend  Participation Quality:  Did Not Attend  Affect:  Did Not Attend  Cognitive:  Did Not Attend  Insight:  None  Engagement in Group:  Did Not Attend  Modes of Intervention:  Did Not Attend  Summary of Progress/Problems: Pt did not attend patient self inventory group.   Diamond Rice 10/17/2015, 10:33 AM

## 2015-10-17 NOTE — Plan of Care (Signed)
Problem: Diagnosis: Increased Risk For Suicide Attempt Goal: STG-Patient Will Attend All Groups On The Unit Outcome: Not Progressing Patient reports that she did not attend all groups today.

## 2015-10-18 LAB — GLUCOSE, CAPILLARY
GLUCOSE-CAPILLARY: 140 mg/dL — AB (ref 65–99)
GLUCOSE-CAPILLARY: 238 mg/dL — AB (ref 65–99)
GLUCOSE-CAPILLARY: 234 mg/dL — AB (ref 65–99)
Glucose-Capillary: 150 mg/dL — ABNORMAL HIGH (ref 65–99)

## 2015-10-18 NOTE — [facility] Group Notes (Signed)
   [facility] LCSW Aftercare Discharge Planning Group Note  10/18/2015  8:45 AM   Participation Quality: Alert, Appropriate and Oriented  Mood/Affect: Depressed and Flat  Depression Rating: 7-8  Anxiety Rating: 8  Thoughts of Suicide: Pt denies SI/HI  Will you contract for safety? Yes  Current AVH: Pt denies  Plan for Discharge/Comments: Pt attended discharge planning group and actively participated in group. CSW provided pt with today's workbook. Patient reports feeling slightly improved but continues to endorse high levels of depression and anxiety. She plans to go to the shelter at discharge.   Transportation Means: Pt denies access to transportation  Supports: No supports mentioned at this time  Tilden Fossa, MSW, [employer] Clinical Social Worker [employer] [phone]

## 2015-10-18 NOTE — Progress Notes (Signed)
Group Note  Pt attended wrap-up group; participated actively. Appropriate behavior. 

## 2015-10-18 NOTE — [facility] Group Notes (Signed)
[facility] LCSW Group Therapy 10/18/2015  1:15 pm  Type of Therapy: Group Therapy Participation Level: Minimal  Participation Quality: Limited   Affect: Depressed and Flat  Cognitive: Alert and Oriented  Insight: Developing/Improving and Engaged  Engagement in Therapy: Developing/Improving and Engaged  Modes of Intervention: Clarification, Confrontation, Discussion, Education, Exploration,  Limit-setting, Orientation, Problem-solving, Rapport Building, Art therapist, Socialization and Support  Summary of Progress/Problems: Pt identified obstacles faced currently and processed barriers involved in overcoming these obstacles. Pt identified steps necessary for overcoming these obstacles and explored motivation (internal and external) for facing these difficulties head on. Pt further identified one area of concern in their lives and chose a goal to focus on for today. Patient did not engage in discussion despite CSW encouragement.  Tilden Fossa, LCSW Clinical Social Worker [facility] [phone]

## 2015-10-18 NOTE — Progress Notes (Signed)
Hypoglycemic Event  CBG 68  Treatment: 15 GM carbohydrate snack  Symptoms: Shaky  Follow-up CBG: Time: 2120 CBG Result: 105  Possible Reasons for Event: Inadequate meal intake  Comments/MD notified: no order obtained, blood sugar wnl after following protocol    Aurora Mask

## 2015-10-18 NOTE — Progress Notes (Signed)
D: Pt presents with flat affect and depressed mood. Pt rates depression 8/10. Anxiety 8/10. Hopeless 8/10. Pt endorses suicidal thoughts without a plan. Pt verbally contracts for safety. Pt stated that she's anxious about where she will go once she discharged. Pt verbalized no support at this time. Pt requested Vistaril this morning for increased anxiety. A: Medications reviewed with pt. Medications administered as ordered per MD. Verbal support provided. Pt encouraged to attend groups. 15 minute checks performed for safety. R: Pt receptive to tx. Pt stated goal "going to groups".

## 2015-10-18 NOTE — Progress Notes (Signed)
Inpatient Diabetes Program Recommendations  AACE/ADA: New Consensus Statement on Inpatient Glycemic Control (2015)  Target Ranges:  Prepandial:   less than 140 mg/dL      Peak postprandial:   less than 180 mg/dL (1-2 hours)      Critically ill patients:  140 - 180 mg/dL   Review of Glycemic Control  Results for LORELLE, RINTOUL (MRN [id number]) as of 10/18/2015 17:14  Ref. Range 10/18/2015 16:58  Glucose-Capillary Latest Ref Range: 65-99 mg/dL 234 (H)   Post-prandial blood sugars elevated. Needs meal coverage insulin.  Recommendations: Add Novolog 3 units tidwc for meal coverage insulin.  Will continue to follow. Thank you. Lorenda Peck, RD, LDN, CDE Inpatient Diabetes Coordinator [phone]

## 2015-10-18 NOTE — Progress Notes (Signed)
Adult Psychoeducational Group Note  Date:  10/18/2015 Time:  10:14 PM  Group Topic/Focus:  Wrap-Up Group:   The focus of this group is to help patients review their daily goal of treatment and discuss progress on daily workbooks.  Participation Level:  Minimal  Participation Quality:  Appropriate  Affect:  Appropriate  Cognitive:  Appropriate  Insight: Good  Engagement in Group:  Engaged  Modes of Intervention:  Problem-solving  Additional Comments:  Tyechia shared with the group how today was good due to her waking up.  She is looking to wake up again the following day.  Blenda [place] 10/18/2015, 10:14 PM

## 2015-10-18 NOTE — Progress Notes (Signed)
Patient has been up in the dayroom this evening. She attended group and was compliant with her medications. She had a low blood sugar and protocol was followed and blood sugar wnl currently. She reports that she did not eat much for dinner tonight. She remains flat and depressed and reports passive si and verbally contracts for safety, Patient currently denies having pain, -hi/a/v hall. Support and encouragement offered, safety maintained on unit, will continue to monitor.

## 2015-10-18 NOTE — Progress Notes (Signed)
[facility] MD Progress Note  10/17/2015 9:26 AM Dillan Savary  MRN:  [id number] Subjective:  She reports " I am still the same my depression 8/10 and now I have high anxiety with the current discharge plans."  Patient continues to endorses suicidal ideation and depression  Objective: Elinore is awake, alert and oriented X3 , found resting in bedroom. Reports suicidal ideation without a plan. Still ruminative regarding current living situation. Denies homicidal ideation. Reports auditory hallucination that's on going and has minimized a bit with current medications. She denies visual hallucination and does not appear to be responding to internal stimuli.  Patient reports she is medication compliant without mediation side effects. States that " I am having increased anxiety because I doesn't want to start all over, or go [place], [place] for residential treatment program". States her depression 7/10. Patient states "I am always feeling depressed. I am still really tired today."  Patient reports increasing anxiety 8/10. States that she is thinking a lot about her upcoming discharge.  Reports she is looking forward to discharge with [facility] and not the rescue mission in [place]. Reports good appetite  and resting well. Support, encouragement and reassurance was provided.       Principal Problem: MDD (major depressive disorder), recurrent, severe, with psychosis ([place]) Diagnosis:   Patient Active Problem List   Diagnosis Date Noted  . MDD (major depressive disorder), recurrent, severe, with psychosis ([place]) [F33.3] 10/07/2015  . OSA (obstructive sleep apnea) [G47.33] 06/09/2015  . Vitamin D deficiency [E55.9] 01/06/2015  . Bilateral knee pain [M25.561, M25.562] 01/05/2015  . Numbness and tingling in right hand [R20.2] 01/05/2015  . Insomnia [G47.00] 12/07/2014  . Right-sided lacunar infarction ([place]) [I63.9] 09/15/2014  . Left hemiparesis ([place]) [G81.94] 09/15/2014  . Dyspnea [R06.00]   . Back pain [M54.9]  09/11/2013  . Psychoactive substance-induced organic mood disorder ([place]) [id number], F06.30] 05/28/2013  . Cocaine abuse with cocaine-induced mood disorder ([place]) [F14.14] 05/28/2013  . Cannabis dependence with cannabis-induced anxiety disorder ([place]) [F12.280] 05/28/2013  . Morbid obesity with BMI of 45.0-49.9, adult ([place]) [E66.01, Z68.42] 04/25/2012  . Chest pain [R07.9] 04/17/2012  . Hypertension [I10] 04/17/2012  . Diabetes mellitus type 2 with complications, uncontrolled ([place]) [E11.8, E11.65] 04/17/2012  . Hyperlipidemia [E78.5] 04/17/2012  . Tobacco use [Z72.0] 04/17/2012   Total Time spent with patient:  20 minutes   Past Psychiatric History: SEE ABOVE  Past Medical History:  Past Medical History  Diagnosis Date  . Hypertension   . Anginal pain ([place])   . High cholesterol   . Shortness of breath 04/17/2012    "all the time"  . Headache(784.0) 04/17/2012    "~ qod; lately waking up in am w/one"  . Migraines 04/17/2012  . Chronic lower back pain 04/17/2012    "just got over some; catched when I walked"  . Depression   . Anxiety   . Sleep apnea   . Obesity   . Stroke ([place])   . Type II diabetes mellitus ([place]) 08/28/2002  . COPD (chronic obstructive pulmonary disease) [facility])     Past Surgical History  Procedure Laterality Date  . Cardiac catheterization  ~ 2011   Family History:  Family History  Problem Relation Age of Onset  . Stroke Brother 57  . Stroke Brother 37  . Heart attack Mother 54  . Stroke Father 72   Family Psychiatric  History: SEE Above Social History:  History  Alcohol Use No    Comment: rare     History  Drug Use  . Yes  . Special: Marijuana, "Crack" cocaine    Social History   Social History  . Marital Status: Single    Spouse Name: N/A  . Number of Children: N/A  . Years of Education: N/A   Social History Main Topics  . Smoking status: Current Every Day Smoker -- 0.50 packs/day for 10 years    Types: Cigarettes    Last Attempt to Quit:  12/07/2014  . Smokeless tobacco: Never Used  . Alcohol Use: No     Comment: rare  . Drug Use: Yes    Special: Marijuana, "Crack" cocaine  . Sexual Activity: Not Currently   Other Topics Concern  . 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   Additional Social History:   Sleep:  Improved   Appetite:  Fair  Current Medications: Current Facility-Administered Medications  Medication Dose Route Frequency Provider Last Rate Last Dose  . acetaminophen (TYLENOL) tablet 650 mg  650 mg Oral Q6H PRN Harriet Butte, NP   650 mg at 10/12/15 0816  . alum & mag hydroxide-simeth (MAALOX/MYLANTA) 200-200-20 MG/5ML suspension 30 mL  30 mL Oral Q4H PRN Harriet Butte, NP      . amLODipine (NORVASC) tablet 10 mg  10 mg Oral Daily Harriet Butte, NP   10 mg at 10/17/15 0804  . ARIPiprazole (ABILIFY) tablet 15 mg  15 mg Oral Daily Jenne Campus, MD   15 mg at 10/17/15 0804  . aspirin EC tablet 325 mg  325 mg Oral Daily Bonnielee Haff, MD   325 mg at 10/17/15 0804  . atorvastatin (LIPITOR) tablet 40 mg  40 mg Oral q1800 Harriet Butte, NP   40 mg at 10/16/15 1624  . cyclobenzaprine (FLEXERIL) tablet 5 mg  5 mg Oral TID PRN Derrill Center, NP   5 mg at 10/17/15 0804  . diclofenac sodium (VOLTAREN) 1 % transdermal gel 2 g  2 g Topical TID WC Harriet Butte, NP   2 g at 10/15/15 0842  . hydrALAZINE (APRESOLINE) tablet 25 mg  25 mg Oral QID Geradine Girt, DO   25 mg at 10/17/15 0804  . hydrOXYzine (ATARAX/VISTARIL) tablet 25 mg  25 mg Oral TID PRN Derrill Center, NP   25 mg at 10/17/15 0839  . insulin aspart (novoLOG) injection 0-20 Units  0-20 Units Subcutaneous TID WC Kerrie Buffalo, NP   4 Units at 10/17/15 0654  . insulin glargine (LANTUS) injection 12 Units  12 Units Subcutaneous Daily Geradine Girt, DO   12 Units at 10/17/15 0804  . lisinopril (PRINIVIL,ZESTRIL) tablet 40 mg  40 mg Oral Daily Jenne Campus, MD   40 mg at 10/17/15 0804  .  magnesium hydroxide (MILK OF MAGNESIA) suspension 30 mL  30 mL Oral Daily PRN Harriet Butte, NP      . mirtazapine (REMERON) tablet 15 mg  15 mg Oral QHS Jenne Campus, MD   15 mg at 10/16/15 2117  . ondansetron (ZOFRAN-ODT) disintegrating tablet 4 mg  4 mg Oral Q8H PRN Kerrie Buffalo, NP   4 mg at 10/15/15 [id number]  . pantoprazole (PROTONIX) EC tablet 40 mg  40 mg Oral Daily Harriet Butte, NP   40 mg at 10/17/15 0804  . sertraline (ZOLOFT) tablet 150 mg  150 mg Oral Daily Jenne Campus, MD   150 mg at 10/17/15 0804  .  Vitamin D (Ergocalciferol) (DRISDOL) capsule 50,000 Units  50,000 Units Oral Q7 days Harriet Butte, NP   50,000 Units at 10/15/15 [id number]    Lab Results:  Results for orders placed or performed during the hospital encounter of 10/07/15 (from the past 48 hour(s))  Glucose, capillary     Status: Abnormal   Collection Time: 10/15/15 12:17 PM  Result Value Ref Range   Glucose-Capillary 179 (H) 65 - 99 mg/dL  Glucose, capillary     Status: Abnormal   Collection Time: 10/15/15  4:58 PM  Result Value Ref Range   Glucose-Capillary 218 (H) 65 - 99 mg/dL  Glucose, capillary     Status: Abnormal   Collection Time: 10/15/15  8:26 PM  Result Value Ref Range   Glucose-Capillary 284 (H) 65 - 99 mg/dL  Glucose, capillary     Status: Abnormal   Collection Time: 10/16/15  5:56 AM  Result Value Ref Range   Glucose-Capillary 179 (H) 65 - 99 mg/dL  Glucose, capillary     Status: Abnormal   Collection Time: 10/16/15 11:49 AM  Result Value Ref Range   Glucose-Capillary 198 (H) 65 - 99 mg/dL  Glucose, capillary     Status: Abnormal   Collection Time: 10/16/15  4:23 PM  Result Value Ref Range   Glucose-Capillary 228 (H) 65 - 99 mg/dL   Comment 1 Notify RN   Glucose, capillary     Status: Abnormal   Collection Time: 10/16/15  8:30 PM  Result Value Ref Range   Glucose-Capillary 280 (H) 65 - 99 mg/dL  Glucose, capillary     Status: Abnormal   Collection Time: 10/17/15  6:42 AM   Result Value Ref Range   Glucose-Capillary 187 (H) 65 - 99 mg/dL    Physical Findings: AIMS: Facial and Oral Movements Muscles of Facial Expression: None, normal Lips and Perioral Area: None, normal Jaw: None, normal Tongue: None, normal,Extremity Movements Upper (arms, wrists, hands, fingers): None, normal Lower (legs, knees, ankles, toes): None, normal, Trunk Movements Neck, shoulders, hips: None, normal, Overall Severity Severity of abnormal movements (highest score from questions above): None, normal Incapacitation due to abnormal movements: None, normal Patient's awareness of abnormal movements (rate only patient's report): No Awareness, Dental Status Current problems with teeth and/or dentures?: No Does patient usually wear dentures?: No  CIWA:  CIWA-Ar Total: 0 COWS:     Musculoskeletal: Strength & Muscle Tone: within normal limits Gait & Station: normal patient ambulates with a walker. Patient leans: N/A  Psychiatric Specialty Exam: Review of Systems  Psychiatric/Behavioral: Positive for depression and suicidal ideas. The patient is nervous/anxious and has insomnia.   All other systems reviewed and are negative.    Blood pressure 145/86, pulse 110, temperature 98.6 F (37 C), temperature source Oral, resp. rate 18, height 5\' 5"  (1.651 m), weight 113.853 kg (251 lb), last menstrual period 10/03/2015, SpO2 99 %.Body mass index is 41.77 kg/(m^2).  General Appearance:   Pleasant, calm and cooperative   Eye Contact::  Good  Speech:  Clear and Coherent  Volume:  Decreased   Mood:  Depressed reports 8/10  Affect:  Constricted , but gradually becoming more reactive   Thought Process:   linear  Orientation:  Full (Time, Place, and Person)  Thought Content:  Describes ongoing auditory hallucinations, critical in nature, denies command hallucinations, does not appear internally preoccupied, no delusions expressed.   Suicidal Thoughts: continues to report passive SI, but  contracts for safety on unit  Homicidal Thoughts:  No  Memory:   Recent and remote grossly intact   Judgement:  Fair  Insight:  Fair  Psychomotor Activity:  Still decreased but less isolated, spending more time in milieu  Concentration:  Fair  Recall:  [employer] of Knowledge:Fair  Language: Good  Akathisia:  NA  Handed:  Right  AIMS (if indicated):     Assets:  Communication Skills Desire for Improvement Financial Resources/Insurance Social Support  ADL's:  Intact  Cognition: WNL  Sleep:  Number of Hours: 6    I agree with current treatment plan on 10/18/2015, Patient seen face-to-face for psychiatric evaluation follow-up, chart reviewed. Reviewed the information documented and agree with the treatment plan.  Treatment Plan Summary:  Daily contact with patient to assess and evaluate symptoms and progress in treatment and Medication management  Continue Vistaril 25 mg PO Q8H PRN for anxiety Continue  Zoloft  150   mgrs QDAY for depression and anxiety Continue  Abilify to 15 mgrs QDAY for mood disorder/ augmentation  Continue  Remeron  15  mgrs QHS for depression and insomnia  Continue Flexeril 5mg   TID PRN for muscular spasms Continue antihypertensive regimen for HTN, as adjusted by Hospitalist service  Treatment team working on disposition plans .   Derrill Center, NP 10/17/2015, 9:26 AM I agree with assessment and plan Woodroe Chen. Sabra Heck, M.D.

## 2015-10-19 LAB — GLUCOSE, CAPILLARY
GLUCOSE-CAPILLARY: 194 mg/dL — AB (ref 65–99)
GLUCOSE-CAPILLARY: 167 mg/dL — AB (ref 65–99)
Glucose-Capillary: 219 mg/dL — ABNORMAL HIGH (ref 65–99)
Glucose-Capillary: 274 mg/dL — ABNORMAL HIGH (ref 65–99)

## 2015-10-19 MED ORDER — SERTRALINE HCL 100 MG PO TABS
200.0000 mg | ORAL_TABLET | Freq: Every day | ORAL | Status: DC
  Administered 2015-10-20 – 2015-10-21 (×2): 200 mg via ORAL
  Filled 2015-10-19 (×2): qty 2
  Filled 2015-10-19: qty 14
  Filled 2015-10-19: qty 2

## 2015-10-19 NOTE — Progress Notes (Signed)
D: Pt presents with flat affect and depressed mood. Pt rates depression 8/10. Anxiety 10/10. Hopeless 8/10. Pt endorses passive suicidal thoughts with out a plan. Pt verbally contracts for safety. Pt reported feeling depressed about not having a place to discharge to. Pt stated that she do not want to be dumped off anywhere. Pt requested Vistaril for anxiety. A: Medications reviewed with pt. Medications administered as ordered per MD. Verbal support provided. Pt encouraged to attend groups. Writer encouraged pt to contact [place] rescue [facility] and ask any concerning questions. 15 minute checks performed for safety.  R: Pt anxious about discharge tomorrow. Pt receptive to tx.

## 2015-10-19 NOTE — Tx Team (Addendum)
Interdisciplinary Treatment Plan Update (Adult) Date: 10/19/2015    Time Reviewed: 9:30 AM  Progress in Treatment: Attending groups: Yes Participating in groups: Minimally Taking medication as prescribed: Yes Tolerating medication: Yes Family/Significant other contact made: No, patient declined for collateral contact Patient understands diagnosis: Yes Discussing patient identified problems/goals with staff: Yes Medical problems stabilized or resolved: Yes Denies suicidal/homicidal ideation: Patient endorses chronic passive SI at baseline Issues/concerns per patient self-inventory: Yes Other:  New problem(s) identified: N/A  Discharge Plan or Barriers: Patient is homeless with limited supports, will likely discharge to shelter to follow up with outpatient services.  Reason for Continuation of Hospitalization:  Depression Anxiety Medication Stabilization   Comments: N/A  Estimated length of stay: 1-2 days    Patient is a 45 year old female with a diagnosis of Major Depressive Disorder. Pt presented to the hospital with suicidal thoughts and substance abuse. Pt reports primary trigger(s) for admission were financial and housing stressors. Patient will benefit from crisis stabilization, medication evaluation, group therapy and psycho education in addition to case management for discharge planning. At discharge, it is recommended that Pt remain compliant with established discharge plan and continued treatment.   Review of initial/current patient goals per problem list:  1. Goal(s): Patient will participate in aftercare plan   Met: Yes   Target date: 3-5 days post admission date   As evidenced by: Patient will participate within aftercare plan AEB aftercare provider and housing plan at discharge being identified.  2/10: Goal not met: CSW assessing for appropriate referrals for pt and will have follow up secured prior to d/c.  2/15: Goal progressing. Patient will likely  discharge to shelter to follow up with [facility] program.  2/21: Goal met. Patient plans to discharge to shelter to follow up with outpatient services.     2. Goal (s): Patient will exhibit decreased depressive symptoms and suicidal ideations.   Met: Adequate for discharge per MD   Target date: 3-5 days post admission date   As evidenced by: Patient will utilize self rating of depression at 3 or below and demonstrate decreased signs of depression or be deemed stable for discharge by MD.  2/10: Goal not met: Pt presents with flat affect and depressed mood.  Pt admitted with depression rating of 10.  Pt to show decreased sign of depression and a rating of 3 or less before d/c.    2/15: Goal progressing. Patient continues to endorse high levels of depression and appears disengaged from milieu, continues to isolate.  2/20: Patient rates depression between 7-8.  2/22: Adequate for discharge per MD. Patient reports improvement in her symptoms though she continues to endorse moderate depression. Patient reports feeling safe for discharge.      3. Goal(s): Patient will demonstrate decreased signs and symptoms of anxiety.   Met: Adequate for discharge per MD   Target date: 3-5 days post admission date   As evidenced by: Patient will utilize self rating of anxiety at 3 or below and demonstrated decreased signs of anxiety, or be deemed stable for discharge by MD   2/10: Goal not met: Pt presents with anxious mood and affect.  Pt admitted with anxiety rating of 10.  Pt to show decreased sign of anxiety and a rating of 3 or less before d/c.  2/15: Goal progressing. Patient appears disengaged from milieu, continues to isolate.  2/20: Patient rates anxiety at 8 today.    2/22: Adequate for discharge per MD. Patient reports improvements in her anxiety   but continues to endorse moderate symptoms. Patient reports feeling safe for discharge.     4. Goal(s): Patient will demonstrate decreased  signs of withdrawal due to substance abuse   Met: Yes   Target date: 3-5 days post admission date   As evidenced by: Patient will produce a CIWA/COWS score of 0, have stable vitals signs, and no symptoms of withdrawal  2/10: Goal met. No withdrawal symptoms reported at this time per medical chart.    Attendees: Patient:    Family:    Physician: Dr. Cobos; Dr. Lugo 10/19/2015 9:30 AM  Nursing: Caroline Beaudry, Marian Friedman, Patrice White, Christa Dobson, RN 10/19/2015 9:30 AM  Clinical Social Worker:  , LCSW 10/19/2015 9:30 AM  Other: Lauren Carter, LCSWA; Heather Smart, LCSW  10/19/2015 9:30 AM  Other:  10/19/2015 9:30 AM  Other: Jennifer Clark, Case Manager 10/19/2015 9:30 AM  Other: Aggie Nwoko, NP 10/19/2015 9:30 AM  Other:    Other:       Scribe for Treatment Team:   , LCSW [phone]     

## 2015-10-19 NOTE — Progress Notes (Signed)
Recreation Therapy Notes  Animal-Assisted Activity (AAA) Program Checklist/Progress Notes Patient Eligibility Criteria Checklist & Daily Group note for Rec Tx Intervention  Date: 02.21.2017 Time: 2:45pm Location: [address]   AAA/T Program Assumption of Risk Form signed by Patient/ or Parent Legal Guardian yes  Patient is free of allergies or sever asthma yes  Patient reports no fear of animals yes  Patient reports no history of cruelty to animals yes  Patient understands his/her participation is voluntary yes  Behavioral Response: Did not attend.   Laureen Ochs Berenise Hunton, LRT/CTRS  Lane Hacker 10/19/2015 3:16 PM

## 2015-10-19 NOTE — [facility] Group Notes (Signed)
[place] LCSW Group Therapy 10/19/2015  1:15 PM   Type of Therapy: Group Therapy  Participation Level: Did Not Attend. Patient invited to participate but declined.   Tilden Fossa, MSW, [place] Clinical Social Worker [facility] [phone]

## 2015-10-19 NOTE — Progress Notes (Addendum)
Patient ID: Diamond Rice, female   DOB: [birth year], 45 y.o.   MRN: [id number] [facility] MD Progress Note  10/17/2015 9:26 AM Diamond Rice  MRN:  [id number] Subjective:  Patient reports some improvement . Continues to endorse depression, anxiety. Ambivalent about disposition options. Denies medication side effects.  Objective:  I have discussed case with treatment team and have met with patient. Remains depressed, constricted in affect, but compared to admission, presents with significant improvement - is more visible on unit, going to groups, less isolated, and presents with improved eye contact, improved grooming, and improved communication, improved speech. Homelessness and lack of social support system are major stressors- she is considering referral to [facility], but ruminates about leaving [place] area. Anxious about disposition and discharge. Partially responsive to support, encouragement, anxiety improves partially during session. No disruptive or agitated behaviors on unit At this time does not endorse medication side effects.      Principal Problem: MDD (major depressive disorder), recurrent, severe, with psychosis ([place]) Diagnosis:   Patient Active Problem List   Diagnosis Date Noted  . MDD (major depressive disorder), recurrent, severe, with psychosis ([place]) [F33.3] 10/07/2015  . OSA (obstructive sleep apnea) [G47.33] 06/09/2015  . Vitamin D deficiency [E55.9] 01/06/2015  . Bilateral knee pain [M25.561, M25.562] 01/05/2015  . Numbness and tingling in right hand [R20.2] 01/05/2015  . Insomnia [G47.00] 12/07/2014  . Right-sided lacunar infarction ([place]) [I63.9] 09/15/2014  . Left hemiparesis ([place]) [G81.94] 09/15/2014  . Dyspnea [R06.00]   . Back pain [M54.9] 09/11/2013  . Psychoactive substance-induced organic mood disorder ([place]) [N82.95, F06.30] 05/28/2013  . Cocaine abuse with cocaine-induced mood disorder ([place]) [F14.14] 05/28/2013  . Cannabis dependence with  cannabis-induced anxiety disorder ([place]) [F12.280] 05/28/2013  . Morbid obesity with BMI of 45.0-49.9, adult ([place]) [E66.01, Z68.42] 04/25/2012  . Chest pain [R07.9] 04/17/2012  . Hypertension [I10] 04/17/2012  . Diabetes mellitus type 2 with complications, uncontrolled ([place]) [E11.8, E11.65] 04/17/2012  . Hyperlipidemia [E78.5] 04/17/2012  . Tobacco use [Z72.0] 04/17/2012   Total Time spent with patient:  20 minutes   Past Psychiatric History: SEE ABOVE  Past Medical History:  Past Medical History  Diagnosis Date  . Hypertension   . Anginal pain ([place])   . High cholesterol   . Shortness of breath 04/17/2012    "all the time"  . Headache(784.0) 04/17/2012    "~ qod; lately waking up in am w/one"  . Migraines 04/17/2012  . Chronic lower back pain 04/17/2012    "just got over some; catched when I walked"  . Depression   . Anxiety   . Sleep apnea   . Obesity   . Stroke ([place])   . Type II diabetes mellitus ([place]) 08/28/2002  . COPD (chronic obstructive pulmonary disease) [facility]     Past Surgical History  Procedure Laterality Date  . Cardiac catheterization  ~ 2011   Family History:  Family History  Problem Relation Age of Onset  . Stroke Brother 41  . Stroke Brother 24  . Heart attack Mother 21  . Stroke Father 75   Family Psychiatric  History: SEE Above Social History:  History  Alcohol Use No    Comment: rare     History  Drug Use  . Yes  . Special: Marijuana, "Crack" cocaine    Social History   Social History  . Marital Status: Single    Spouse Name: N/A  . Number of Children: N/A  . Years of Education: N/A   Social  History Main Topics  . Smoking status: Current Every Day Smoker -- 0.50 packs/day for 10 years    Types: Cigarettes    Last Attempt to Quit: 12/07/2014  . Smokeless tobacco: Never Used  . Alcohol Use: No     Comment: rare  . Drug Use: Yes    Special: Marijuana, "Crack" cocaine  . Sexual Activity: Not Currently   Other Topics Concern  . 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   Additional Social History:   Sleep:  Improved   Appetite:  Fair  Current Medications: Current Facility-Administered Medications  Medication Dose Route Frequency Provider Last Rate Last Dose  . acetaminophen (TYLENOL) tablet 650 mg  650 mg Oral Q6H PRN Harriet Butte, NP   650 mg at 10/12/15 0816  . alum & mag hydroxide-simeth (MAALOX/MYLANTA) 200-200-20 MG/5ML suspension 30 mL  30 mL Oral Q4H PRN Harriet Butte, NP      . amLODipine (NORVASC) tablet 10 mg  10 mg Oral Daily Harriet Butte, NP   10 mg at 10/17/15 0804  . ARIPiprazole (ABILIFY) tablet 15 mg  15 mg Oral Daily Jenne Campus, MD   15 mg at 10/17/15 0804  . aspirin EC tablet 325 mg  325 mg Oral Daily Bonnielee Haff, MD   325 mg at 10/17/15 0804  . atorvastatin (LIPITOR) tablet 40 mg  40 mg Oral q1800 Harriet Butte, NP   40 mg at 10/16/15 1624  . cyclobenzaprine (FLEXERIL) tablet 5 mg  5 mg Oral TID PRN Derrill Center, NP   5 mg at 10/17/15 0804  . diclofenac sodium (VOLTAREN) 1 % transdermal gel 2 g  2 g Topical TID WC Harriet Butte, NP   2 g at 10/15/15 0842  . hydrALAZINE (APRESOLINE) tablet 25 mg  25 mg Oral QID Geradine Girt, DO   25 mg at 10/17/15 0804  . hydrOXYzine (ATARAX/VISTARIL) tablet 25 mg  25 mg Oral TID PRN Derrill Center, NP   25 mg at 10/17/15 0839  . insulin aspart (novoLOG) injection 0-20 Units  0-20 Units Subcutaneous TID WC Kerrie Buffalo, NP   4 Units at 10/17/15 0654  . insulin glargine (LANTUS) injection 12 Units  12 Units Subcutaneous Daily Geradine Girt, DO   12 Units at 10/17/15 0804  . lisinopril (PRINIVIL,ZESTRIL) tablet 40 mg  40 mg Oral Daily Jenne Campus, MD   40 mg at 10/17/15 0804  . magnesium hydroxide (MILK OF MAGNESIA) suspension 30 mL  30 mL Oral Daily PRN Harriet Butte, NP      . mirtazapine (REMERON) tablet 15 mg  15 mg Oral QHS Jenne Campus, MD   15 mg at 10/16/15 2117  .  ondansetron (ZOFRAN-ODT) disintegrating tablet 4 mg  4 mg Oral Q8H PRN Kerrie Buffalo, NP   4 mg at 10/15/15 0160  . pantoprazole (PROTONIX) EC tablet 40 mg  40 mg Oral Daily Harriet Butte, NP   40 mg at 10/17/15 0804  . sertraline (ZOLOFT) tablet 150 mg  150 mg Oral Daily Jenne Campus, MD   150 mg at 10/17/15 0804  . Vitamin D (Ergocalciferol) (DRISDOL) capsule 50,000 Units  50,000 Units Oral Q7 days Harriet Butte, NP   50,000 Units at 10/15/15 1093    Lab Results:  Results for orders placed or performed during the hospital encounter of 10/07/15 (from the  past 48 hour(s))  Glucose, capillary     Status: Abnormal   Collection Time: 10/15/15 12:17 PM  Result Value Ref Range   Glucose-Capillary 179 (H) 65 - 99 mg/dL  Glucose, capillary     Status: Abnormal   Collection Time: 10/15/15  4:58 PM  Result Value Ref Range   Glucose-Capillary 218 (H) 65 - 99 mg/dL  Glucose, capillary     Status: Abnormal   Collection Time: 10/15/15  8:26 PM  Result Value Ref Range   Glucose-Capillary 284 (H) 65 - 99 mg/dL  Glucose, capillary     Status: Abnormal   Collection Time: 10/16/15  5:56 AM  Result Value Ref Range   Glucose-Capillary 179 (H) 65 - 99 mg/dL  Glucose, capillary     Status: Abnormal   Collection Time: 10/16/15 11:49 AM  Result Value Ref Range   Glucose-Capillary 198 (H) 65 - 99 mg/dL  Glucose, capillary     Status: Abnormal   Collection Time: 10/16/15  4:23 PM  Result Value Ref Range   Glucose-Capillary 228 (H) 65 - 99 mg/dL   Comment 1 Notify RN   Glucose, capillary     Status: Abnormal   Collection Time: 10/16/15  8:30 PM  Result Value Ref Range   Glucose-Capillary 280 (H) 65 - 99 mg/dL  Glucose, capillary     Status: Abnormal   Collection Time: 10/17/15  6:42 AM  Result Value Ref Range   Glucose-Capillary 187 (H) 65 - 99 mg/dL    Physical Findings: AIMS: Facial and Oral Movements Muscles of Facial Expression: None, normal Lips and Perioral Area: None, normal Jaw:  None, normal Tongue: None, normal,Extremity Movements Upper (arms, wrists, hands, fingers): None, normal Lower (legs, knees, ankles, toes): None, normal, Trunk Movements Neck, shoulders, hips: None, normal, Overall Severity Severity of abnormal movements (highest score from questions above): None, normal Incapacitation due to abnormal movements: None, normal Patient's awareness of abnormal movements (rate only patient's report): No Awareness, Dental Status Current problems with teeth and/or dentures?: No Does patient usually wear dentures?: No  CIWA:  CIWA-Ar Total: 0 COWS:     Musculoskeletal: Strength & Muscle Tone: within normal limits Gait & Station: normal patient ambulates with a walker. Patient leans: N/A  Psychiatric Specialty Exam: Review of Systems  Psychiatric/Behavioral: Positive for depression and suicidal ideas. The patient is nervous/anxious and has insomnia.   All other systems reviewed and are negative.    Blood pressure 145/86, pulse 110, temperature 98.6 F (37 C), temperature source Oral, resp. rate 18, height 5\' 5"  (1.651 m), weight 113.853 kg (251 lb), last menstrual period 10/03/2015, SpO2 99 %.Body mass index is 41.77 kg/(m^2).  General Appearance:   Improved grooming   Eye Contact::  improved   Speech:  Clear and Coherent  Volume:  Decreased   Mood: depressed, but to lesser degree than on admission  Affect:   Less constricted   Thought Process:   linear  Orientation:  Full (Time, Place, and Person)  Thought Content: decreased hallucinations, currently not internally preoccupied , no delusions - ruminative and anxious about discharge and disposition options   Suicidal Thoughts: denies any suicidal plan or intention, contracts for safety on unit - has had some passive thoughts of death but these have decreased compared to before    Homicidal Thoughts:  No  Memory:   Recent and remote grossly intact   Judgement:  Improving   Insight:  Improving    Psychomotor Activity:  Less isolated   Concentration:  Good   Recall:  Good   Fund of Knowledge:good   Language: Good  Akathisia:  NA  Handed:  Right  AIMS (if indicated):     Assets:  Communication Skills Desire for Improvement Financial Resources/Insurance Social Support  ADL's:  Intact  Cognition: WNL  Sleep:  Number of Hours: 6    Assessment - patient remains  Depressed, anxious, but has improved significantly compared to admission, and is now presenting with improved eye contact, improved grooming, improved socialization with peers, less isolation. Denies SI at this time. Hallucinations have also subsided. She remains anxious and ruminative, particularly about disposition options. At this time tolerating medications well .   Treatment Plan Summary: Continue to encourage group participation to work on coping skills and symptom reduction. Daily contact with patient to assess and evaluate symptoms and progress in treatment and Medication management  Continue Vistaril 25 mg PO Q8H PRN for anxiety Increase  Zoloft  To 200    mgrs QDAY for depression and anxiety Continue  Abilify  15 mgrs QDAY for mood disorder/ augmentation  Continue  Remeron  15  mgrs QHS for depression and insomnia  Continue Flexeril '5mg'$   TID PRN for muscular spasms Continue antihypertensive regimen for HTN, as adjusted by Hospitalist service  Treatment team working on disposition plans .   Derrill Center, NP 10/17/2015, 9:26 AM I agree with assessment and plan Woodroe Chen. Sabra Heck, M.D.

## 2015-10-19 NOTE — Progress Notes (Signed)
D: Patient observed in dayroom with peers interacting. Patient states her goal for the day was to "go to groups." Patient states she did achieve goal and was pleased. Patient in pleasant mood. A: Support and encouragement offered. Q 15 minute checks in progress and maintained.  R: Patient remains safe on unit and monitoring continues.

## 2015-10-19 NOTE — Progress Notes (Signed)
[facility] Group Notes:  (Nursing/MHT/Case Management/Adjunct)  Date:  10/19/2015  Time:  2100  Type of Therapy:  wrap up group  Participation Level:  Minimal  Participation Quality:  Attentive and Supportive  Affect:  Flat  Cognitive:  Appropriate  Insight:  Limited  Engagement in Group:  Limited and Resistant  Modes of Intervention:  Clarification, Education and Support  Summary of Progress/Problems: Patient was resistant to sharing. Pt responded "I don't know to almost all questions." Pt did share that she would like to go back to [place] because she has supportive people there.   Jacques Navy 10/19/2015, 10:05 PM

## 2015-10-20 LAB — GLUCOSE, CAPILLARY
GLUCOSE-CAPILLARY: 228 mg/dL — AB (ref 65–99)
GLUCOSE-CAPILLARY: 266 mg/dL — AB (ref 65–99)
GLUCOSE-CAPILLARY: 245 mg/dL — AB (ref 65–99)
Glucose-Capillary: 152 mg/dL — ABNORMAL HIGH (ref 65–99)

## 2015-10-20 NOTE — BHH Group Notes (Signed)
[facility] LCSW Group Therapy 10/20/2015 1:15 PM  Type of Therapy: Group Therapy- Emotion Regulation  Pt did not attend, declined invitation.   Peri Maris, LCSWA 10/20/2015 4:16 PM

## 2015-10-20 NOTE — Progress Notes (Signed)
D: Pt presents with flat affect and depressed mood. Pt rates depression 8/10. Hopeless 8/10. Anxiety 10/10. Pt reports feeling anxious about discharging to [facility]. Pt requesting to be discharged in the morning to allow  time for her to gather her belongings. Pt denies endorsing suicidal thoughts this morning. Pt reports good sleep and appetite. Pt reports that she's tolerating her meds well. No adverse reactions to meds verbalized by pt. A: Medications reviewed with pt. Medications administered as ordered per MD. Verbal support provided. Pt encouraged to attend groups. 15 minute checks performed for safety. R:  Pt receptive to tx.

## 2015-10-20 NOTE — BHH Suicide Risk Assessment (Addendum)
[facility] Discharge Suicide Risk Assessment   Principal Problem: MDD (major depressive disorder), recurrent, severe, with psychosis ([place]) Discharge Diagnoses:  Patient Active Problem List   Diagnosis Date Noted  . MDD (major depressive disorder), recurrent, severe, with psychosis ([place]) [F33.3] 10/07/2015  . OSA (obstructive sleep apnea) [G47.33] 06/09/2015  . Vitamin D deficiency [E55.9] 01/06/2015  . Bilateral knee pain [M25.561, M25.562] 01/05/2015  . Numbness and tingling in right hand [R20.2] 01/05/2015  . Insomnia [G47.00] 12/07/2014  . Right-sided lacunar infarction ([place]) [I63.9] 09/15/2014  . Left hemiparesis ([place]) [G81.94] 09/15/2014  . Dyspnea [R06.00]   . Back pain [M54.9] 09/11/2013  . Psychoactive substance-induced organic mood disorder ([place]) [id number], F06.30] 05/28/2013  . Cocaine abuse with cocaine-induced mood disorder ([place]) [F14.14] 05/28/2013  . Cannabis dependence with cannabis-induced anxiety disorder ([place]) [F12.280] 05/28/2013  . Morbid obesity with BMI of 45.0-49.9, adult ([place]) [E66.01, Z68.42] 04/25/2012  . Chest pain [R07.9] 04/17/2012  . Hypertension [I10] 04/17/2012  . Diabetes mellitus type 2 with complications, uncontrolled ([place]) [E11.8, E11.65] 04/17/2012  . Hyperlipidemia [E78.5] 04/17/2012  . Tobacco use [Z72.0] 04/17/2012    Total Time spent with patient: 30 minutes  Musculoskeletal: Strength & Muscle Tone: within normal limits - walks with cane due to history of CVA Gait & Station: normal Patient leans: N/A  Psychiatric Specialty Exam: ROS  Blood pressure 133/95, pulse 108, temperature 98.9 F (37.2 C), temperature source Oral, resp. rate 24, height 5\' 5"  (1.651 m), weight 251 lb (113.853 kg), last menstrual period 10/03/2015, SpO2 99 %.Body mass index is 41.77 kg/(m^2).  General Appearance: improved grooming   Eye Contact::  Good  Speech:  Normal Rate409  Volume:  Normal  Mood:  improved, less depressed   Affect:  Appropriate- more reactive    Thought Process:  Linear  Orientation:  Full (Time, Place, and Person)  Thought Content:  less ruminative, denies any ongoing hallucinations, and no delusions are expressed   Suicidal Thoughts:  No denies plan or intention of hurting self or of SI   Homicidal Thoughts:  No denies any violent or homicidal ideations  Memory:  recent and remote grossly intact   Judgement:  Other:  improved   Insight:  improved   Psychomotor Activity:  Normal  Concentration:  Good  Recall:  Good  Fund of Knowledge:Good  Language: Good  Akathisia:  Negative  Handed:  Right  AIMS (if indicated):     Assets:  Desire for Improvement Resilience  Sleep:  Number of Hours: 6.25  Cognition: WNL  ADL's:  Intact   Mental Status Per Nursing Assessment::   On Admission:  Suicidal ideation indicated by patient  Demographic Factors:  45 year old female, currently homeless, unemployed   Loss Factors: Homelessness, unemployment, history of CVA   Historical Factors: History of depression, history of psychotic symptoms in the context of depression, history of cocaine abuse   Risk Reduction Factors:   Positive coping skills or problem solving skills  Continued Clinical Symptoms:  Patient presents with improved mood, less depressed, with less constricted/ more reactive affect, reports some anxiety about relocating to [place], but more hopeful than before , no thought disorder, no current psychotic symptoms, no suicidal plan or intention, no HI.  Denies medication side effects.   Cognitive Features That Contribute To Risk:  No gross cognitive deficits noted upon discharge. Is alert , attentive, and oriented x 3    Suicide Risk:  Mild:  Suicidal ideation of limited frequency, intensity, duration, and specificity.  There are  no identifiable plans, no associated intent, mild dysphoria and related symptoms, good self-control (both objective and subjective assessment), few other risk factors, and identifiable  protective factors, including available and accessible social support.  Follow-up Information    [facility] with [facility] 10/22/2015.   Why:  Assessment for therapy and medication management services on Friday Feb. 24th at 12:30pm. Please bring your ID, hospital discharge paperwork, any medications, and a letter from the shelter stating that you are residing there.    Contact information:   [address] [place] [address] [phone] fax [phone]      Please follow up.   Why:  Call if you need to reschedule appointment.      Follow up with [facility].   Why:  Walk-in Monday-Friday at 1pm to schedule new patient appointment for primary care services. First come first served. Bring documentation from shelter that you are a resident there and take your hospital discharge paperwork with you to appointment.   Contact information:   [address] [place], [place] [address] [phone]  On the campus of [facility]/Follow-up recommendations:  Activity:  as tolerated  Diet:  Heart Healthy, Diabetic Diet  Tests:  NA Other:  see below  Patient is going to [facility] Will establish PCP care in [place] for ongoing management of medical issues  Neita Garnet, MD 10/20/2015, 10:08 AM

## 2015-10-20 NOTE — Progress Notes (Signed)
Pt has been in the dayroom most of the evening watching TV and listening to the other patients, but very little interaction herself.  Pt seems down and depressed this evening.  She reports that she may be discharged tomorrow, but would like to stay until Thursday.  Pt will be going to a homeless shelter, probably in [place].  Pt denies SI/HI/AVH.  Her BP has been staying high even with medications.  Pt voices no other complaints.  Support and encouragement offered.  Pt is med compliant.  Safety maintained with q15 minute checks.

## 2015-10-20 NOTE — Progress Notes (Addendum)
Adult Psychoeducational Group Note  Date:  10/20/2015 Time:  9:51 PM  Group Topic/Focus:  Wrap-Up Group:   The focus of this group is to help patients review their daily goal of treatment and discuss progress on daily workbooks.  Participation Level:  Active  Participation Quality:  Appropriate  Affect:  Appropriate  Cognitive:  Alert  Insight: Appropriate  Engagement in Group:  Engaged  Modes of Intervention:  Discussion  Additional Comments:  Patient goal for today was to wake up. On a scale between 1-10, (1=worse, 10=best) patient rated her day an 8 because "I am discharging tomorrow".  Diamond Rice 10/20/2015, 9:51 PM

## 2015-10-20 NOTE — Progress Notes (Signed)
  [facility] Adult Case Management Discharge Plan :  Will you be returning to the same living situation after discharge:  No. Patient plans to discharge to [facility] At discharge, do you have transportation home?: Yes,  patient provided with bus/train pass Do you have the ability to pay for your medications: Yes,  patient will be provided with prescriptions at discharge  Release of information consent forms completed and in the chart;  Patient's signature needed at discharge.  Patient to Follow up at: Follow-up Information    Follow up with [facility] On 10/22/2015.   Why:  Assessment for therapy and medication management services on Friday Feb. 24th at 12:30pm. Please bring your ID, hospital discharge paperwork, any medications, and a letter from the shelter stating that you are residing there.    Contact information:   [address] [place] [address] [phone] fax [phone]      Please follow up.   Why:  Call if you need to reschedule appointment.      Follow up with [facility].   Why:  Walk-in Monday-Friday at 1pm to schedule new patient appointment for primary care services. First come first served. Bring documentation from shelter that you are a resident there and take your hospital discharge paperwork with you to appointment.   Contact information:   [address] [place], [place] [address] [phone]  On the campus of [facility]      Next level of care provider has access to [employer] and Suicide Prevention discussed: Yes,  with patient  Have you used any form of tobacco in the last 30 days? (Cigarettes, Smokeless Tobacco, Cigars, and/or Pipes): Yes  Has patient been referred to the Quitline?: Patient refused referral  Patient has been referred for addiction treatment: Yes  Shaquasia Caponigro, Casimiro Needle 10/20/2015, 8:19 AM

## 2015-10-20 NOTE — Progress Notes (Signed)
Recreation Therapy Notes  Date: 02.22.2017 Time: 9:30am Location: [address] Group Room   Group Topic: Stress Management  Goal Area(s) Addresses:  Patient will actively participate in stress management techniques presented during session.   Behavioral Response: Did not attend.   Laureen Ochs Tinslee Klare, LRT/CTRS        Nekhi Liwanag L 10/20/2015 10:22 AM

## 2015-10-21 LAB — GLUCOSE, CAPILLARY: GLUCOSE-CAPILLARY: 213 mg/dL — AB (ref 65–99)

## 2015-10-21 MED ORDER — HYDRALAZINE HCL 25 MG PO TABS: 25.0000 mg | ORAL_TABLET | Freq: Four times a day (QID) | ORAL | Status: DC

## 2015-10-21 MED ORDER — ASPIRIN 325 MG PO TABS: 325.0000 mg | ORAL_TABLET | Freq: Every day | ORAL | Status: DC

## 2015-10-21 MED ORDER — INSULIN GLARGINE 300 UNIT/ML ~~LOC~~ SOPN: 13.0000 [IU] | PEN_INJECTOR | Freq: Every day | SUBCUTANEOUS | Status: DC

## 2015-10-21 MED ORDER — CYCLOBENZAPRINE HCL 5 MG PO TABS: 5.0000 mg | ORAL_TABLET | Freq: Three times a day (TID) | ORAL | Status: DC | PRN

## 2015-10-21 MED ORDER — VITAMIN D (ERGOCALCIFEROL) 1.25 MG (50000 UNIT) PO CAPS: 50000.0000 [IU] | ORAL_CAPSULE | ORAL | Status: DC

## 2015-10-21 MED ORDER — INSULIN PEN NEEDLE 31G X 8 MM MISC: 1.0000 "application " | Freq: Every day | Status: DC

## 2015-10-21 MED ORDER — ATORVASTATIN CALCIUM 40 MG PO TABS: 40.0000 mg | ORAL_TABLET | Freq: Every day | ORAL | Status: DC

## 2015-10-21 MED ORDER — GLUCOSE BLOOD VI STRP: 1.0000 | ORAL_STRIP | Freq: Three times a day (TID) | Status: DC

## 2015-10-21 MED ORDER — MIRTAZAPINE 15 MG PO TABS: 15.0000 mg | ORAL_TABLET | Freq: Every day | ORAL | Status: DC

## 2015-10-21 MED ORDER — DICLOFENAC SODIUM 1 % TD GEL: 2.0000 g | Freq: Three times a day (TID) | TRANSDERMAL | Status: DC

## 2015-10-21 MED ORDER — SERTRALINE HCL 100 MG PO TABS: 200.0000 mg | ORAL_TABLET | Freq: Every day | ORAL | Status: DC

## 2015-10-21 MED ORDER — PANTOPRAZOLE SODIUM 40 MG PO TBEC: 40.0000 mg | DELAYED_RELEASE_TABLET | Freq: Every day | ORAL | Status: DC

## 2015-10-21 MED ORDER — ARIPIPRAZOLE 15 MG PO TABS
15.0000 mg | ORAL_TABLET | Freq: Every day | ORAL | Status: DC
Start: 2015-10-21 — End: 2017-05-17

## 2015-10-21 MED ORDER — LISINOPRIL 40 MG PO TABS: 40.0000 mg | ORAL_TABLET | Freq: Every day | ORAL | Status: DC

## 2015-10-21 MED ORDER — ACETAMINOPHEN 325 MG PO TABS: 650.0000 mg | ORAL_TABLET | Freq: Four times a day (QID) | ORAL | Status: DC | PRN

## 2015-10-21 MED ORDER — AMLODIPINE BESYLATE 10 MG PO TABS: ORAL_TABLET | ORAL | Status: DC

## 2015-10-21 MED ORDER — HYDROXYZINE HCL 25 MG PO TABS: 25.0000 mg | ORAL_TABLET | Freq: Three times a day (TID) | ORAL | Status: DC | PRN

## 2015-10-21 NOTE — Progress Notes (Signed)
Pt reports she is being discharged tomorrow to go to [facility].  She says that she will travel by [facility] and that the [facility] will give her the instructions for getting to the station.  Pt is anxious about the process of getting there as she has never ben to [facility].  Pt denies SI/HI/AVH.  She has been sitting in the dayroom most of the evening watching TV and talking with peers.  Pt makes her needs known to staff.  Pt voices no needs or concerns other that asking for Vistaril d/t her increased anxiety level.  Pt given the prn Vistaril 25 mg.  Support and encouragement offered.  Safety maintained with q15 minute checks.

## 2015-10-21 NOTE — BHH Suicide Risk Assessment (Signed)
[facility] Discharge Suicide Risk Assessment   Principal Problem: MDD (major depressive disorder), recurrent, severe, with psychosis ([place]) Discharge Diagnoses:  Patient Active Problem List   Diagnosis Date Noted  . MDD (major depressive disorder), recurrent, severe, with psychosis ([place]) [F33.3] 10/07/2015  . OSA (obstructive sleep apnea) [G47.33] 06/09/2015  . Vitamin D deficiency [E55.9] 01/06/2015  . Bilateral knee pain [M25.561, M25.562] 01/05/2015  . Numbness and tingling in right hand [R20.2] 01/05/2015  . Insomnia [G47.00] 12/07/2014  . Right-sided lacunar infarction ([place]) [I63.9] 09/15/2014  . Left hemiparesis ([place]) [G81.94] 09/15/2014  . Dyspnea [R06.00]   . Back pain [M54.9] 09/11/2013  . Psychoactive substance-induced organic mood disorder ([place]) [id number], F06.30] 05/28/2013  . Cocaine abuse with cocaine-induced mood disorder ([place]) [F14.14] 05/28/2013  . Cannabis dependence with cannabis-induced anxiety disorder ([place]) [F12.280] 05/28/2013  . Morbid obesity with BMI of 45.0-49.9, adult ([place]) [E66.01, Z68.42] 04/25/2012  . Chest pain [R07.9] 04/17/2012  . Hypertension [I10] 04/17/2012  . Diabetes mellitus type 2 with complications, uncontrolled ([place]) [E11.8, E11.65] 04/17/2012  . Hyperlipidemia [E78.5] 04/17/2012  . Tobacco use [Z72.0] 04/17/2012    Total Time spent with patient: 30 minutes  Musculoskeletal: Strength & Muscle Tone: within normal limits walks slowly , often with cane, due to history of CVA  Gait & Station: normal Patient leans: N/A  Psychiatric Specialty Exam: ROS  Blood pressure 137/83, pulse 107, temperature 99 F (37.2 C), temperature source Oral, resp. rate 17, height 5\' 5"  (1.651 m), weight 251 lb (113.853 kg), last menstrual period 10/03/2015, SpO2 99 %.Body mass index is 41.77 kg/(m^2).  General Appearance: Well Groomed  Eye Contact::  Good  Speech:  Normal Rate409  Volume:  Normal  Mood:  improved mood, less depressed   Affect:  more reactive    Thought Process:  Linear  Orientation:  Full (Time, Place, and Person)  Thought Content:  denies hallucinations, no delusions - not internally preoccupied at this time   Suicidal Thoughts:  No- denies any suicidal ideations and denies any self injurious ideations  Homicidal Thoughts:  No  Memory:  recent and remote grossly intact   Judgement:  Other:  improved   Insight:  improved  Psychomotor Activity:  Normal  Concentration:  Good  Recall:  Good  Fund of Knowledge:Good  Language: Good  Akathisia:  Negative  Handed:  Right  AIMS (if indicated):     Assets:  Communication Skills Desire for Improvement Resilience  Sleep:  Number of Hours: 6.75  Cognition: WNL  ADL's:  Intact   Mental Status Per Nursing Assessment::   On Admission:  Suicidal ideation indicated by patient  Demographic Factors:  45 year old female, currently homeless, no children  Loss Factors: Homelessness, lack of income, and recent relationship break up   Historical Factors: History of severe depression, history of cocaine abuse, history of CVA  Risk Reduction Factors:   Positive coping skills or problem solving skills  Continued Clinical Symptoms:  At this time patient is improved compared to admission- her mood has improved , her affect is less constricted, and more reactive, no thought disorder, hallucinations have resolved and is not currently psychotic, no current suicidal ideations, no homicidal ideations. At present denies any medication side effects  Cognitive Features That Contribute To Risk:  No gross cognitive deficits noted upon discharge. Is alert , attentive, and oriented x 3    Suicide Risk:  Mild:  Suicidal ideation of limited frequency, intensity, duration, and specificity.  There are no identifiable plans, no associated  intent, mild dysphoria and related symptoms, good self-control (both objective and subjective assessment), few other risk factors, and identifiable protective factors,  including available and accessible social support.  Follow-up Information    [facility] with [facility] On 10/22/2015.   Why:  Assessment for therapy and medication management services on Friday Feb. 24th at 12:30pm. Please bring your ID, hospital discharge paperwork, any medications, and a letter from the shelter stating that you are residing there.    Contact information:   [address] [place] [address] [phone] fax [phone]      Please follow up.   Why:  Call if you need to reschedule appointment.      Follow up with [facility].   Why:  Walk-in Monday-Friday at 1pm to schedule new patient appointment for primary care services. First come first served. Bring documentation from shelter that you are a resident there and take your hospital discharge paperwork with you to appointment.   Contact information:   [address] [place], [place] [address] [phone]  On the campus of [facility]/Follow-up recommendations:  Activity:  as tolerated  Diet:  Heart Healthy, Diabetic Diet  Tests:  NA Other:  See below  Patient is going to [place], MD 10/21/2015, 9:58 AM

## 2015-10-21 NOTE — Progress Notes (Signed)
Patient ID: Diamond Rice, female   DOB: 03-11-1971, 45 y.o.   MRN: [id number]  Adult Psychoeducational Group Note  Date:  10/21/2015 Time: 09:15am  Group Topic/Focus:  Overcoming Stress:   The focus of this group is to define stress and help patients assess their triggers.  Participation Level:  Did Not Attend  Participation Quality:  n/a  Affect:: n/a  Cognitive:  n/a  Insight: n/a  Engagement in Group: n/a  Modes of Intervention: n/a  Additional Comments:  Pt chose not to attend, pt in bed asleep.   [name] 10/21/2015, 12:43 PM
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
## 2015-10-21 NOTE — Progress Notes (Signed)
Patient ID: Diamond Rice, female   DOB: March 16, 1971, 45 y.o.   MRN: [id number]  Pt currently presents with a flat affect and guarded behavior. Per self inventory, pt rates depression at a 5, hopelessness 8 and anxiety 10. Pt's daily goal is "making it to the train station." Pt reports good sleep, a good appetite, low energy and poor concentration.   Pt provided with medications per providers orders. Pt's labs and vitals were monitored throughout the day. Pt supported emotionally and encouraged to express concerns and questions. Pt educated on medications. CBG monitored.   Pt's safety ensured with 15 minute and environmental checks. Pt currently denies SI/HI and A/V hallucinations. Pt verbally agrees to seek staff if SI/HI or A/VH occurs and to consult with staff before acting on these thoughts. Pt to be discharged today per MD.  Will continue POC.

## 2015-10-21 NOTE — Progress Notes (Signed)
Patient ID: Diamond Rice, female   DOB: April 09, 1971, 45 y.o.   MRN: [id number]  Pt discharged home with a bus pass, [employer] ticket and instructions on how to find the bus stop. Pt was stable and appreciative at that time. All papers and prescriptions were given and valuables returned. Verbal understanding expressed. Denies SI/HI and A/VH. Pt given opportunity to express concerns and ask questions.

## 2015-10-21 NOTE — Progress Notes (Signed)
  [facility] Adult Case Management Discharge Plan :  Will you be returning to the same living situation after discharge:  No. Pt is going to [facility] [facility] At discharge, do you have transportation home?: Yes,  Pt provided with bus pass and train fare  Do you have the ability to pay for your medications: Yes,  Pt provided with supply and prescriptions  Release of information consent forms completed and in the chart;  Patient's signature needed at discharge.  Patient to Follow up at: Follow-up Information    Follow up with [facility] On 10/22/2015.   Why:  Assessment for therapy and medication management services on Friday Feb. 24th at 12:30pm. Please bring your ID, hospital discharge paperwork, any medications, and a letter from the shelter stating that you are residing there.    Contact information:   [address] [place] [address] [phone] fax [phone]      Please follow up.   Why:  Call if you need to reschedule appointment.      Follow up with [facility] for the Homeless.   Why:  Walk-in Monday-Friday at 1pm to schedule new patient appointment for primary care services. First come first served. Bring documentation from shelter that you are a resident there and take your hospital discharge paperwork with you to appointment.   Contact information:   [address] [place], [place] [address] [phone]  On the campus of [facility]      Next level of care provider has access to [employer] and Suicide Prevention discussed: Yes,  with Pt; declines family contact  Have you used any form of tobacco in the last 30 days? (Cigarettes, Smokeless Tobacco, Cigars, and/or Pipes): Yes  Has patient been referred to the Quitline?: Patient refused referral  Patient has been referred for addiction treatment: Yes- see above  Bo Mcclintock 10/21/2015, 10:32 AM

## 2015-12-24 ENCOUNTER — Encounter: Payer: Self-pay | Admitting: Clinical

## 2015-12-24 NOTE — Progress Notes (Signed)
Ms. Uveges' CPAP has arrived at [facility]. If pt is able to come in on Thursday, May 4 at either 9am or 10am, a pulmonology nurse will be available to educate patient on how to use her new CPAP. Pt does not currently have a contact number to reach her. Terrence from [employer] has been called, and has agreed to give message to Ms. Fahl, if she comes into the [facility], to call [employer], and ask to speak to [place]. Samella Parr will coordinate CPAP appointment and/or will appoint someone from [facility] to coordinate this appointment.   If Ms. Argomaniz is located, and she is able to come to appointment at above days/times, please notify Baxter Flattery at [facility] Pulmonology at [phone].  If Ms. Wied is unable to come to an appointment at the above days/times, contact Tara(above) to find out other potential days/times.

## 2015-12-30 DIAGNOSIS — I6930 Unspecified sequelae of cerebral infarction: Secondary | ICD-10-CM | POA: Insufficient documentation

## 2016-03-07 DIAGNOSIS — D509 Iron deficiency anemia, unspecified: Secondary | ICD-10-CM | POA: Insufficient documentation

## 2016-03-09 DIAGNOSIS — E538 Deficiency of other specified B group vitamins: Secondary | ICD-10-CM | POA: Insufficient documentation

## 2016-03-21 DIAGNOSIS — G3184 Mild cognitive impairment, so stated: Secondary | ICD-10-CM | POA: Insufficient documentation

## 2016-03-21 DIAGNOSIS — F331 Major depressive disorder, recurrent, moderate: Secondary | ICD-10-CM | POA: Insufficient documentation

## 2016-05-09 ENCOUNTER — Telehealth: Payer: Self-pay

## 2016-05-09 NOTE — Telephone Encounter (Signed)
The patient has no contact phone # to inform her that there is a CPAP at the [facility] for her.  As per Vesta Mixer, LCSW, the patient has been  known to the [facility]).   Call placed to the [facility] # [phone] and spoke to [place] who stated that she would leave a message for the patient to call [facility] and ask for Marlies Ligman/Jessica if she is to come to the [facility].

## 2016-11-06 DIAGNOSIS — T1491XA Suicide attempt, initial encounter: Secondary | ICD-10-CM | POA: Insufficient documentation

## 2017-02-17 DIAGNOSIS — N183 Chronic kidney disease, stage 3 unspecified: Secondary | ICD-10-CM | POA: Insufficient documentation

## 2017-02-17 DIAGNOSIS — Z9114 Patient's other noncompliance with medication regimen: Secondary | ICD-10-CM | POA: Insufficient documentation

## 2017-05-17 ENCOUNTER — Emergency Department (HOSPITAL_COMMUNITY)
Admission: EM | Admit: 2017-05-17 | Discharge: 2017-05-18 | Disposition: A | Discharge status: Other Acute Inpatient Hospital | Payer: No Typology Code available for payment source | Attending: Emergency Medicine | Admitting: Emergency Medicine

## 2017-05-17 ENCOUNTER — Emergency Department (HOSPITAL_COMMUNITY): Payer: No Typology Code available for payment source

## 2017-05-17 ENCOUNTER — Encounter (HOSPITAL_COMMUNITY): Payer: Self-pay | Admitting: Emergency Medicine

## 2017-05-17 DIAGNOSIS — F329 Major depressive disorder, single episode, unspecified: Secondary | ICD-10-CM | POA: Diagnosis present

## 2017-05-17 DIAGNOSIS — F17210 Nicotine dependence, cigarettes, uncomplicated: Secondary | ICD-10-CM | POA: Insufficient documentation

## 2017-05-17 DIAGNOSIS — J449 Chronic obstructive pulmonary disease, unspecified: Secondary | ICD-10-CM | POA: Insufficient documentation

## 2017-05-17 DIAGNOSIS — F1410 Cocaine abuse, uncomplicated: Secondary | ICD-10-CM | POA: Diagnosis not present

## 2017-05-17 DIAGNOSIS — R45851 Suicidal ideations: Secondary | ICD-10-CM | POA: Diagnosis not present

## 2017-05-17 DIAGNOSIS — F323 Major depressive disorder, single episode, severe with psychotic features: Secondary | ICD-10-CM

## 2017-05-17 DIAGNOSIS — F333 Major depressive disorder, recurrent, severe with psychotic symptoms: Secondary | ICD-10-CM | POA: Diagnosis not present

## 2017-05-17 DIAGNOSIS — I10 Essential (primary) hypertension: Secondary | ICD-10-CM | POA: Diagnosis not present

## 2017-05-17 DIAGNOSIS — E119 Type 2 diabetes mellitus without complications: Secondary | ICD-10-CM | POA: Insufficient documentation

## 2017-05-17 LAB — COMPREHENSIVE METABOLIC PANEL
ALBUMIN: 3.5 g/dL (ref 3.5–5.0)
ALT: 10 U/L — ABNORMAL LOW (ref 14–54)
ANION GAP: 10 (ref 5–15)
AST: 14 U/L — ABNORMAL LOW (ref 15–41)
Alkaline Phosphatase: 73 U/L (ref 38–126)
BUN: 6 mg/dL (ref 6–20)
CALCIUM: 9.3 mg/dL (ref 8.9–10.3)
CHLORIDE: 102 mmol/L (ref 101–111)
CO2: 24 mmol/L (ref 22–32)
Creatinine, Ser: 1.1 mg/dL — ABNORMAL HIGH (ref 0.44–1.00)
GFR calc non Af Amer: 60 mL/min — ABNORMAL LOW (ref >60)
GLUCOSE: 172 mg/dL — AB (ref 65–99)
Potassium: 3 mmol/L — ABNORMAL LOW (ref 3.5–5.1)
SODIUM: 136 mmol/L (ref 135–145)
Total Bilirubin: 0.5 mg/dL (ref 0.3–1.2)
Total Protein: 7.5 g/dL (ref 6.5–8.1)

## 2017-05-17 LAB — RAPID URINE DRUG SCREEN, HOSP PERFORMED
Amphetamines: NOT DETECTED
Barbiturates: NOT DETECTED
Benzodiazepines: NOT DETECTED
COCAINE: POSITIVE — AB
OPIATES: NOT DETECTED
TETRAHYDROCANNABINOL: POSITIVE — AB

## 2017-05-17 LAB — SALICYLATE LEVEL

## 2017-05-17 LAB — CBC
HEMATOCRIT: 38.8 % (ref 36.0–46.0)
HEMOGLOBIN: 12.5 g/dL (ref 12.0–15.0)
MCH: 23.1 pg — ABNORMAL LOW (ref 26.0–34.0)
MCHC: 32.2 g/dL (ref 30.0–36.0)
MCV: 71.9 fL — ABNORMAL LOW (ref 78.0–100.0)
Platelets: 353 10*3/uL (ref 150–400)
RBC: 5.4 MIL/uL — ABNORMAL HIGH (ref 3.87–5.11)
RDW: 15.5 % (ref 11.5–15.5)
WBC: 9.3 10*3/uL (ref 4.0–10.5)

## 2017-05-17 LAB — CBG MONITORING, ED: Glucose-Capillary: 173 mg/dL — ABNORMAL HIGH (ref 65–99)

## 2017-05-17 LAB — I-STAT BETA HCG BLOOD, ED (MC, WL, AP ONLY)

## 2017-05-17 LAB — ETHANOL: Alcohol, Ethyl (B): <5 mg/dL (ref <5)

## 2017-05-17 LAB — ACETAMINOPHEN LEVEL: Acetaminophen (Tylenol), Serum: <10 ug/mL — ABNORMAL LOW (ref 10–30)

## 2017-05-17 MED ORDER — DIPHENHYDRAMINE HCL 25 MG PO CAPS
25.0000 mg | ORAL_CAPSULE | Freq: Once | ORAL | Status: AC
  Administered 2017-05-17: 25 mg via ORAL
  Filled 2017-05-17: qty 1

## 2017-05-17 MED ORDER — AMLODIPINE BESYLATE 5 MG PO TABS
10.0000 mg | ORAL_TABLET | Freq: Every day | ORAL | Status: DC
  Administered 2017-05-18: 10 mg via ORAL
  Filled 2017-05-17: qty 2

## 2017-05-17 MED ORDER — INSULIN ASPART 100 UNIT/ML ~~LOC~~ SOLN: 0.0000 [IU] | Freq: Every day | SUBCUTANEOUS | Status: DC

## 2017-05-17 MED ORDER — HYDRALAZINE HCL 20 MG/ML IJ SOLN: 10.0000 mg | Freq: Once | INTRAMUSCULAR | Status: DC

## 2017-05-17 MED ORDER — PANTOPRAZOLE SODIUM 40 MG PO TBEC
40.0000 mg | DELAYED_RELEASE_TABLET | Freq: Every day | ORAL | Status: DC
  Administered 2017-05-17 – 2017-05-18 (×2): 40 mg via ORAL
  Filled 2017-05-17 (×2): qty 1

## 2017-05-17 MED ORDER — ARIPIPRAZOLE 5 MG PO TABS
15.0000 mg | ORAL_TABLET | Freq: Every day | ORAL | Status: DC
  Administered 2017-05-17 – 2017-05-18 (×2): 15 mg via ORAL
  Filled 2017-05-17: qty 1
  Filled 2017-05-17: qty 2

## 2017-05-17 MED ORDER — ACETAMINOPHEN 500 MG PO TABS
1000.0000 mg | ORAL_TABLET | Freq: Once | ORAL | Status: AC
  Administered 2017-05-17: 1000 mg via ORAL
  Filled 2017-05-17: qty 2

## 2017-05-17 MED ORDER — ASPIRIN 325 MG PO TABS
325.0000 mg | ORAL_TABLET | Freq: Every day | ORAL | Status: DC
  Administered 2017-05-17 – 2017-05-18 (×2): 325 mg via ORAL
  Filled 2017-05-17 (×2): qty 1

## 2017-05-17 MED ORDER — HYDRALAZINE HCL 25 MG PO TABS
25.0000 mg | ORAL_TABLET | Freq: Once | ORAL | Status: AC
  Administered 2017-05-17: 25 mg via ORAL
  Filled 2017-05-17: qty 1

## 2017-05-17 MED ORDER — LISINOPRIL 20 MG PO TABS
40.0000 mg | ORAL_TABLET | Freq: Once | ORAL | Status: AC
  Administered 2017-05-17: 40 mg via ORAL
  Filled 2017-05-17: qty 2

## 2017-05-17 MED ORDER — ALBUTEROL SULFATE HFA 108 (90 BASE) MCG/ACT IN AERS
2.0000 | INHALATION_SPRAY | Freq: Once | RESPIRATORY_TRACT | Status: AC
  Administered 2017-05-17: 2 via RESPIRATORY_TRACT
  Filled 2017-05-17: qty 6.7

## 2017-05-17 MED ORDER — PROCHLORPERAZINE MALEATE 5 MG PO TABS
10.0000 mg | ORAL_TABLET | Freq: Once | ORAL | Status: AC
  Administered 2017-05-17: 10 mg via ORAL
  Filled 2017-05-17: qty 2

## 2017-05-17 MED ORDER — HYDRALAZINE HCL 25 MG PO TABS
25.0000 mg | ORAL_TABLET | Freq: Four times a day (QID) | ORAL | Status: DC
  Administered 2017-05-18 (×2): 25 mg via ORAL
  Filled 2017-05-17 (×3): qty 1

## 2017-05-17 MED ORDER — NICOTINE 21 MG/24HR TD PT24
21.0000 mg | MEDICATED_PATCH | Freq: Every day | TRANSDERMAL | Status: DC
  Administered 2017-05-17: 21 mg via TRANSDERMAL
  Filled 2017-05-17: qty 1

## 2017-05-17 MED ORDER — ACETAMINOPHEN 325 MG PO TABS: 650.0000 mg | ORAL_TABLET | ORAL | Status: DC | PRN

## 2017-05-17 MED ORDER — INSULIN ASPART 100 UNIT/ML ~~LOC~~ SOLN
4.0000 [IU] | Freq: Three times a day (TID) | SUBCUTANEOUS | Status: DC
  Administered 2017-05-18: 4 [IU] via SUBCUTANEOUS
  Filled 2017-05-17: qty 1

## 2017-05-17 MED ORDER — POTASSIUM CHLORIDE 20 MEQ PO PACK
40.0000 meq | PACK | Freq: Two times a day (BID) | ORAL | Status: DC
  Administered 2017-05-17: 40 meq via ORAL
  Filled 2017-05-17 (×2): qty 2

## 2017-05-17 MED ORDER — ATORVASTATIN CALCIUM 40 MG PO TABS: 40.0000 mg | ORAL_TABLET | Freq: Every day | ORAL | Status: DC

## 2017-05-17 MED ORDER — AMLODIPINE BESYLATE 5 MG PO TABS
10.0000 mg | ORAL_TABLET | Freq: Once | ORAL | Status: AC
  Administered 2017-05-17: 10 mg via ORAL
  Filled 2017-05-17: qty 2

## 2017-05-17 MED ORDER — LISINOPRIL 20 MG PO TABS
40.0000 mg | ORAL_TABLET | Freq: Every day | ORAL | Status: DC
  Administered 2017-05-18: 40 mg via ORAL
  Filled 2017-05-17: qty 2

## 2017-05-17 MED ORDER — MIRTAZAPINE 15 MG PO TABS
15.0000 mg | ORAL_TABLET | Freq: Every day | ORAL | Status: DC
  Administered 2017-05-17: 15 mg via ORAL
  Filled 2017-05-17: qty 1

## 2017-05-17 MED ORDER — INSULIN ASPART 100 UNIT/ML ~~LOC~~ SOLN
0.0000 [IU] | Freq: Three times a day (TID) | SUBCUTANEOUS | Status: DC
Start: 2017-05-18 — End: 2017-05-18
  Administered 2017-05-18 (×2): 5 [IU] via SUBCUTANEOUS
  Filled 2017-05-17 (×2): qty 1

## 2017-05-17 MED ORDER — SERTRALINE HCL 100 MG PO TABS
200.0000 mg | ORAL_TABLET | Freq: Every day | ORAL | Status: DC
  Administered 2017-05-18: 200 mg via ORAL
  Filled 2017-05-17: qty 2

## 2017-05-17 MED ORDER — HYDRALAZINE HCL 25 MG PO TABS
25.0000 mg | ORAL_TABLET | Freq: Once | ORAL | Status: AC
  Administered 2017-05-17: 25 mg via ORAL

## 2017-05-17 MED ORDER — HYDROXYZINE HCL 25 MG PO TABS: 25.0000 mg | ORAL_TABLET | Freq: Three times a day (TID) | ORAL | Status: DC | PRN
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
## 2017-05-17 NOTE — ED Triage Notes (Signed)
Pt to ED via [facility] from [facility] with c/o headache and HTN.  Pt st's she has not taken her HTN meds in over a month.  Pt was being seen at [facility] for SI

## 2017-05-17 NOTE — ED Notes (Signed)
Staffing office notified for pt.'s sitter, purple scrubs given to pt. , security notified to wand pt.

## 2017-05-17 NOTE — BHH Counselor (Signed)
Per Lindon Romp, NP: Patient meets criteria for inpatient treatment.  Pending reviewed with [facility] Larose Kells, RN.  TTS to seek additional placement.  Attending Provider Billy Fischer, MD, notified at 2256.

## 2017-05-18 ENCOUNTER — Inpatient Hospital Stay (HOSPITAL_COMMUNITY)
Admission: AD | Admit: 2017-05-18 | Discharge: 2017-05-26 | DRG: 885 | Disposition: A | Discharge status: Home / Self Care | Payer: No Typology Code available for payment source | Source: Intra-hospital | Attending: Psychiatry | Admitting: Psychiatry

## 2017-05-18 ENCOUNTER — Encounter (HOSPITAL_COMMUNITY): Payer: Self-pay | Admitting: Emergency Medicine

## 2017-05-18 DIAGNOSIS — Z8673 Personal history of transient ischemic attack (TIA), and cerebral infarction without residual deficits: Secondary | ICD-10-CM | POA: Diagnosis not present

## 2017-05-18 DIAGNOSIS — E119 Type 2 diabetes mellitus without complications: Secondary | ICD-10-CM | POA: Diagnosis present

## 2017-05-18 DIAGNOSIS — F1220 Cannabis dependence, uncomplicated: Secondary | ICD-10-CM | POA: Diagnosis not present

## 2017-05-18 DIAGNOSIS — G8929 Other chronic pain: Secondary | ICD-10-CM | POA: Diagnosis present

## 2017-05-18 DIAGNOSIS — K219 Gastro-esophageal reflux disease without esophagitis: Secondary | ICD-10-CM | POA: Diagnosis present

## 2017-05-18 DIAGNOSIS — F1410 Cocaine abuse, uncomplicated: Secondary | ICD-10-CM | POA: Diagnosis present

## 2017-05-18 DIAGNOSIS — G4733 Obstructive sleep apnea (adult) (pediatric): Secondary | ICD-10-CM | POA: Diagnosis present

## 2017-05-18 DIAGNOSIS — Z590 Homelessness: Secondary | ICD-10-CM | POA: Diagnosis not present

## 2017-05-18 DIAGNOSIS — Z885 Allergy status to narcotic agent status: Secondary | ICD-10-CM

## 2017-05-18 DIAGNOSIS — M545 Low back pain: Secondary | ICD-10-CM | POA: Diagnosis present

## 2017-05-18 DIAGNOSIS — E7800 Pure hypercholesterolemia, unspecified: Secondary | ICD-10-CM | POA: Diagnosis present

## 2017-05-18 DIAGNOSIS — F333 Major depressive disorder, recurrent, severe with psychotic symptoms: Principal | ICD-10-CM | POA: Diagnosis present

## 2017-05-18 DIAGNOSIS — E876 Hypokalemia: Secondary | ICD-10-CM | POA: Diagnosis present

## 2017-05-18 DIAGNOSIS — Z79899 Other long term (current) drug therapy: Secondary | ICD-10-CM | POA: Diagnosis not present

## 2017-05-18 DIAGNOSIS — R440 Auditory hallucinations: Secondary | ICD-10-CM | POA: Diagnosis not present

## 2017-05-18 DIAGNOSIS — F39 Unspecified mood [affective] disorder: Secondary | ICD-10-CM | POA: Diagnosis not present

## 2017-05-18 DIAGNOSIS — I252 Old myocardial infarction: Secondary | ICD-10-CM | POA: Diagnosis not present

## 2017-05-18 DIAGNOSIS — F1420 Cocaine dependence, uncomplicated: Secondary | ICD-10-CM | POA: Diagnosis not present

## 2017-05-18 DIAGNOSIS — R45851 Suicidal ideations: Secondary | ICD-10-CM | POA: Diagnosis present

## 2017-05-18 DIAGNOSIS — E785 Hyperlipidemia, unspecified: Secondary | ICD-10-CM | POA: Diagnosis present

## 2017-05-18 DIAGNOSIS — Z91410 Personal history of adult physical and sexual abuse: Secondary | ICD-10-CM | POA: Diagnosis not present

## 2017-05-18 DIAGNOSIS — F259 Schizoaffective disorder, unspecified: Secondary | ICD-10-CM | POA: Diagnosis not present

## 2017-05-18 DIAGNOSIS — Z8249 Family history of ischemic heart disease and other diseases of the circulatory system: Secondary | ICD-10-CM | POA: Diagnosis not present

## 2017-05-18 DIAGNOSIS — F251 Schizoaffective disorder, depressive type: Secondary | ICD-10-CM | POA: Diagnosis not present

## 2017-05-18 DIAGNOSIS — I10 Essential (primary) hypertension: Secondary | ICD-10-CM | POA: Diagnosis present

## 2017-05-18 DIAGNOSIS — Z915 Personal history of self-harm: Secondary | ICD-10-CM | POA: Diagnosis not present

## 2017-05-18 DIAGNOSIS — J449 Chronic obstructive pulmonary disease, unspecified: Secondary | ICD-10-CM | POA: Diagnosis present

## 2017-05-18 DIAGNOSIS — F329 Major depressive disorder, single episode, unspecified: Secondary | ICD-10-CM | POA: Diagnosis not present

## 2017-05-18 DIAGNOSIS — R0789 Other chest pain: Secondary | ICD-10-CM | POA: Diagnosis not present

## 2017-05-18 DIAGNOSIS — F419 Anxiety disorder, unspecified: Secondary | ICD-10-CM | POA: Diagnosis present

## 2017-05-18 DIAGNOSIS — F17210 Nicotine dependence, cigarettes, uncomplicated: Secondary | ICD-10-CM | POA: Diagnosis present

## 2017-05-18 DIAGNOSIS — E559 Vitamin D deficiency, unspecified: Secondary | ICD-10-CM | POA: Diagnosis present

## 2017-05-18 DIAGNOSIS — G4700 Insomnia, unspecified: Secondary | ICD-10-CM | POA: Diagnosis present

## 2017-05-18 DIAGNOSIS — F250 Schizoaffective disorder, bipolar type: Secondary | ICD-10-CM | POA: Diagnosis present

## 2017-05-18 DIAGNOSIS — Z823 Family history of stroke: Secondary | ICD-10-CM

## 2017-05-18 DIAGNOSIS — M549 Dorsalgia, unspecified: Secondary | ICD-10-CM | POA: Diagnosis not present

## 2017-05-18 DIAGNOSIS — Z6841 Body Mass Index (BMI) 40.0 and over, adult: Secondary | ICD-10-CM | POA: Diagnosis not present

## 2017-05-18 DIAGNOSIS — F1994 Other psychoactive substance use, unspecified with psychoactive substance-induced mood disorder: Secondary | ICD-10-CM | POA: Diagnosis not present

## 2017-05-18 DIAGNOSIS — R079 Chest pain, unspecified: Secondary | ICD-10-CM | POA: Diagnosis present

## 2017-05-18 LAB — CBG MONITORING, ED
GLUCOSE-CAPILLARY: 227 mg/dL — AB (ref 65–99)
GLUCOSE-CAPILLARY: 209 mg/dL — AB (ref 65–99)

## 2017-05-18 LAB — GLUCOSE, CAPILLARY: Glucose-Capillary: 195 mg/dL — ABNORMAL HIGH (ref 65–99)

## 2017-05-18 MED ORDER — POTASSIUM CHLORIDE 20 MEQ/15ML (10%) PO SOLN
40.0000 meq | Freq: Two times a day (BID) | ORAL | Status: DC
  Administered 2017-05-18: 40 meq via ORAL
  Filled 2017-05-18: qty 30

## 2017-05-18 MED ORDER — MAGNESIUM HYDROXIDE 400 MG/5ML PO SUSP
30.0000 mL | Freq: Every day | ORAL | Status: DC | PRN
  Administered 2017-05-21 – 2017-05-25 (×2): 30 mL via ORAL
  Filled 2017-05-18 (×2): qty 30

## 2017-05-18 MED ORDER — INSULIN GLARGINE 100 UNIT/ML ~~LOC~~ SOLN
13.0000 [IU] | Freq: Every day | SUBCUTANEOUS | Status: DC
  Administered 2017-05-18 – 2017-05-26 (×8): 13 [IU] via SUBCUTANEOUS

## 2017-05-18 MED ORDER — ALUM & MAG HYDROXIDE-SIMETH 200-200-20 MG/5ML PO SUSP
30.0000 mL | ORAL | Status: DC | PRN
  Administered 2017-05-23: 30 mL via ORAL
  Filled 2017-05-18: qty 30

## 2017-05-18 MED ORDER — TRAZODONE HCL 50 MG PO TABS
50.0000 mg | ORAL_TABLET | Freq: Every evening | ORAL | Status: DC | PRN
  Administered 2017-05-19 – 2017-05-21 (×4): 50 mg via ORAL
  Filled 2017-05-18 (×18): qty 1

## 2017-05-18 MED ORDER — HYDROXYZINE HCL 25 MG PO TABS
25.0000 mg | ORAL_TABLET | Freq: Three times a day (TID) | ORAL | Status: DC | PRN
  Administered 2017-05-19 – 2017-05-25 (×9): 25 mg via ORAL
  Filled 2017-05-18 (×10): qty 1

## 2017-05-18 NOTE — Progress Notes (Signed)
Pt A/O, no noted distress. Denies SI/HI/AVH at the time of admission. Pt noted she occasionally have thoughts of harming herself. She lives alone, the only woman in a boarding house. She was tearful during assessment. Living alone in a boarding house is one of main triggers. She smokes a cigarette a day. She notes uses a "CPAP." She has a history of COPD. Her twin brother is her only contact whom lives in [place]. She denies substance abuse, but UDS +THC/Cocaine. History of stroke/MI, left side weakness (uses a walker/cane). The unit has provided patient a walker. Skin has no intact/no open areas. Staff will continue to monitor, meet needs, and maintain safety.

## 2017-05-18 NOTE — ED Notes (Signed)
Meal tray given 

## 2017-05-18 NOTE — ED Notes (Signed)
1 pair of earrings added to patient belongings.

## 2017-05-18 NOTE — ED Notes (Signed)
Fall risk precautions initiated.

## 2017-05-18 NOTE — Progress Notes (Signed)
DAR Note: Pt remained flat and isolated to her room-in bed with eyes closed. Pt did not look to be in any acute distress. Pt at the time of assessment endorsed moderate anxiety, depression and command AH; "I'm anxious and depressed and the voice tells me to hurt myself."-Pt contracts for safety. Pt denied HI and pain. All patient's questions and concerns addressed. Pt did not attend AA group. Safety checks continue. Pt refused to participate.

## 2017-05-18 NOTE — Progress Notes (Signed)
Drink and snack given to Pt

## 2017-05-18 NOTE — ED Notes (Signed)
Patient able to ambulate independently  

## 2017-05-18 NOTE — Progress Notes (Addendum)
Recv'd report from [facility], nurse will be calling [place]. Room 302(2).

## 2017-05-18 NOTE — Progress Notes (Signed)
Patient meets criteria for inpatient treatment. CSW faxed referrals to the following inpatient facilities for review:  [facility], [facility], [facility], [facility], [facility]   TTS will continue to seek bed placement.   Radonna Ricker MSW, [place] Disposition [phone]

## 2017-05-18 NOTE — Progress Notes (Signed)
Per Letitia Libra , [facility], patient has been accepted to [facility], bed 302-2 ; Accepting provider is Lindon Romp; Attending provider is  Dr. Parke Poisson.  Patient can arrive now, bed is ready. Number for report is [phone].  Linna Hoff, RN notified.     Radonna Ricker MSW, [place] Disposition [phone]

## 2017-05-18 NOTE — ED Notes (Signed)
Patient ate 40% of meal will hold correction insulin

## 2017-05-18 NOTE — BH Assessment (Signed)
[facility] Assessment Progress Note    05/18/2017 - Re-assessment: Patient reports, "feeling about the same" which includes feelings of depression and being alone. Reports continues to have suicidal thoughts with no plan. Reports suicidal thoughts occuring the past month. Reported thought things would get better once she received housing and disability. Reports had been homeless for two years, two months ago obtained housing. Denied hearing voices on today 05/18/2017, reported last heard voices yesterday 05/17/2017 which told her to 'just give up.' Patient denies wanting to hurt others.   TTS continue to seek inpatient treatment

## 2017-05-18 NOTE — ED Notes (Signed)
Meal given

## 2017-05-19 ENCOUNTER — Encounter (HOSPITAL_COMMUNITY): Payer: Self-pay | Admitting: Psychiatry

## 2017-05-19 DIAGNOSIS — R440 Auditory hallucinations: Secondary | ICD-10-CM

## 2017-05-19 DIAGNOSIS — F17210 Nicotine dependence, cigarettes, uncomplicated: Secondary | ICD-10-CM

## 2017-05-19 DIAGNOSIS — F1220 Cannabis dependence, uncomplicated: Secondary | ICD-10-CM

## 2017-05-19 DIAGNOSIS — F39 Unspecified mood [affective] disorder: Secondary | ICD-10-CM

## 2017-05-19 DIAGNOSIS — F1420 Cocaine dependence, uncomplicated: Secondary | ICD-10-CM | POA: Diagnosis present

## 2017-05-19 DIAGNOSIS — F251 Schizoaffective disorder, depressive type: Secondary | ICD-10-CM

## 2017-05-19 DIAGNOSIS — I10 Essential (primary) hypertension: Secondary | ICD-10-CM

## 2017-05-19 LAB — GLUCOSE, CAPILLARY
GLUCOSE-CAPILLARY: 141 mg/dL — AB (ref 65–99)
GLUCOSE-CAPILLARY: 177 mg/dL — AB (ref 65–99)
GLUCOSE-CAPILLARY: 246 mg/dL — AB (ref 65–99)

## 2017-05-19 MED ORDER — IBUPROFEN 600 MG PO TABS
600.0000 mg | ORAL_TABLET | Freq: Three times a day (TID) | ORAL | Status: DC | PRN
  Administered 2017-05-19 – 2017-05-25 (×3): 600 mg via ORAL
  Filled 2017-05-19 (×3): qty 1

## 2017-05-19 MED ORDER — ARIPIPRAZOLE 10 MG PO TABS
10.0000 mg | ORAL_TABLET | Freq: Every day | ORAL | Status: DC
  Administered 2017-05-19 – 2017-05-22 (×4): 10 mg via ORAL
  Filled 2017-05-19 (×7): qty 1

## 2017-05-19 MED ORDER — INSULIN ASPART 100 UNIT/ML ~~LOC~~ SOLN
0.0000 [IU] | Freq: Three times a day (TID) | SUBCUTANEOUS | Status: DC
  Administered 2017-05-19 – 2017-05-20 (×2): 4 [IU] via SUBCUTANEOUS
  Administered 2017-05-20: 7 [IU] via SUBCUTANEOUS
  Administered 2017-05-20: 4 [IU] via SUBCUTANEOUS
  Administered 2017-05-21: 7 [IU] via SUBCUTANEOUS
  Administered 2017-05-21 (×2): 4 [IU] via SUBCUTANEOUS
  Administered 2017-05-22 (×2): 3 [IU] via SUBCUTANEOUS
  Administered 2017-05-22: 4 [IU] via SUBCUTANEOUS
  Administered 2017-05-23: 3 [IU] via SUBCUTANEOUS
  Administered 2017-05-23: 4 [IU] via SUBCUTANEOUS
  Administered 2017-05-23: 3 [IU] via SUBCUTANEOUS
  Administered 2017-05-24: 7 [IU] via SUBCUTANEOUS
  Administered 2017-05-24 (×2): 4 [IU] via SUBCUTANEOUS
  Administered 2017-05-25 (×2): 3 [IU] via SUBCUTANEOUS
  Administered 2017-05-25: 4 [IU] via SUBCUTANEOUS

## 2017-05-19 MED ORDER — SERTRALINE HCL 50 MG PO TABS
50.0000 mg | ORAL_TABLET | Freq: Every day | ORAL | Status: DC
  Administered 2017-05-19 – 2017-05-22 (×4): 50 mg via ORAL
  Filled 2017-05-19 (×7): qty 1

## 2017-05-19 MED ORDER — AMLODIPINE BESYLATE 10 MG PO TABS
10.0000 mg | ORAL_TABLET | Freq: Every day | ORAL | Status: DC
  Administered 2017-05-19 – 2017-05-26 (×8): 10 mg via ORAL
  Filled 2017-05-19 (×6): qty 1
  Filled 2017-05-19: qty 2
  Filled 2017-05-19 (×3): qty 1

## 2017-05-19 MED ORDER — FLUTICASONE PROPIONATE 50 MCG/ACT NA SUSP
1.0000 | Freq: Two times a day (BID) | NASAL | Status: DC
  Administered 2017-05-19 – 2017-05-26 (×14): 1 via NASAL
  Filled 2017-05-19: qty 16

## 2017-05-19 MED ORDER — LISINOPRIL 20 MG PO TABS
20.0000 mg | ORAL_TABLET | Freq: Every day | ORAL | Status: DC
  Administered 2017-05-19 – 2017-05-23 (×5): 20 mg via ORAL
  Filled 2017-05-19 (×7): qty 1

## 2017-05-19 MED ORDER — POTASSIUM CHLORIDE CRYS ER 20 MEQ PO TBCR
20.0000 meq | EXTENDED_RELEASE_TABLET | Freq: Every day | ORAL | Status: DC
  Administered 2017-05-19 – 2017-05-23 (×5): 20 meq via ORAL
  Filled 2017-05-19 (×8): qty 1

## 2017-05-19 MED ORDER — INSULIN ASPART 100 UNIT/ML ~~LOC~~ SOLN
0.0000 [IU] | Freq: Every day | SUBCUTANEOUS | Status: DC
  Administered 2017-05-19: 2 [IU] via SUBCUTANEOUS
  Administered 2017-05-21: 3 [IU] via SUBCUTANEOUS
  Administered 2017-05-23: 2 [IU] via SUBCUTANEOUS

## 2017-05-19 MED ORDER — FERROUS SULFATE 325 (65 FE) MG PO TABS
325.0000 mg | ORAL_TABLET | Freq: Two times a day (BID) | ORAL | Status: DC
  Administered 2017-05-19 – 2017-05-26 (×14): 325 mg via ORAL
  Filled 2017-05-19 (×16): qty 1

## 2017-05-19 NOTE — BHH Group Notes (Signed)
[facility] LCSW Group Therapy  05/19/2017 11:00 AM  Type of Therapy:  Group Therapy  Participation Level:  Did not Attend  Christene Lye MSW, LCSW
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
## 2017-05-19 NOTE — Tx Team (Signed)
Initial Treatment Plan 05/19/2017 12:35 AM Diamond Rice [id number]    PATIENT STRESSORS: Financial difficulties Medication change or noncompliance Occupational concerns Substance abuse Traumatic event   PATIENT STRENGTHS: Capable of independent living Communication skills   PATIENT IDENTIFIED PROBLEMS: "I need the voices to stop"  "I don't feel like doing anything"  Risk for suicide  Depression  Anxiety  Substance Abuse  Psychosis         DISCHARGE CRITERIA:  Ability to meet basic life and health needs Adequate post-discharge living arrangements Verbal commitment to aftercare and medication compliance  PRELIMINARY DISCHARGE PLAN: Return to previous living arrangement  PATIENT/FAMILY INVOLVEMENT: This treatment plan has been presented to and reviewed with the patient, Diamond Rice, and/or family member.  The patient and family have been given the opportunity to ask questions and make suggestions.  Leia Alf, RN 05/19/2017, 12:35 AM

## 2017-05-19 NOTE — BHH Suicide Risk Assessment (Signed)
[facility] Admission Suicide Risk Assessment   Nursing information obtained from:    Demographic factors:    Current Mental Status:    Loss Factors:    Historical Factors:    Risk Reduction Factors:     Total Time spent with patient: 30 minutes Principal Problem: Schizoaffective disorder ([place]) Diagnosis:   Patient Active Problem List   Diagnosis Date Noted  . Cannabis use disorder, moderate, dependence ([place]) [F12.20] 05/19/2017  . Cocaine use disorder, moderate, dependence ([place]) [F14.20] 05/19/2017  . Schizoaffective disorder ([place]) [F25.9] 10/07/2015  . OSA (obstructive sleep apnea) [G47.33] 06/09/2015  . Vitamin D deficiency [E55.9] 01/06/2015  . Bilateral knee pain [M25.561, M25.562] 01/05/2015  . Numbness and tingling in right hand [R20.0, R20.2] 01/05/2015  . Insomnia [G47.00] 12/07/2014  . Right-sided lacunar infarction ([place]) [I63.9] 09/15/2014  . Left hemiparesis ([place]) [G81.94] 09/15/2014  . Dyspnea [R06.00]   . Back pain [M54.9] 09/11/2013  . Psychoactive substance-induced organic mood disorder ([place]) [P38.25, F06.30] 05/28/2013  . Cocaine abuse with cocaine-induced mood disorder ([place]) [F14.14] 05/28/2013  . Cannabis dependence with cannabis-induced anxiety disorder ([place]) [F12.280] 05/28/2013  . Morbid obesity with BMI of 45.0-49.9, adult ([place]) [E66.01, Z68.42] 04/25/2012  . Chest pain [R07.9] 04/17/2012  . Hypertension [I10] 04/17/2012  . Diabetes mellitus type 2 with complications, uncontrolled ([employer]) [E11.8, E11.65] 04/17/2012  . Hyperlipidemia [E78.5] 04/17/2012  . Tobacco use [Z72.0] 04/17/2012   Subjective Data: Patient with hx of psychosis, depressive sx, cocaine and cannabis abuse, hx of CVA , presented with worsening AH as well as SI. Past hx of suicide attempts - positive. Pt agrees to restarting zoloft , abilify. Pt also wants to be back on remeron for sleep, states it helped in the past.   Continued Clinical Symptoms:  Alcohol Use Disorder Identification Test  Final Score (AUDIT): 1 The "Alcohol Use Disorders Identification Test", Guidelines for Use in Primary Care, Second Edition.  World Pharmacologist [facility]). Score between 0-7:  no or low risk or alcohol related problems. Score between 8-15:  moderate risk of alcohol related problems. Score between 16-19:  high risk of alcohol related problems. Score 20 or above:  warrants further diagnostic evaluation for alcohol dependence and treatment.   CLINICAL FACTORS:   Depression:   Comorbid alcohol abuse/dependence Hopelessness Impulsivity Insomnia Alcohol/Substance Abuse/Dependencies Currently Psychotic Unstable or Poor Therapeutic Relationship Previous Psychiatric Diagnoses and Treatments Medical Diagnoses and Treatments/Surgeries   Musculoskeletal: Strength & Muscle Tone: within normal limits Gait & Station: seen in bed Patient leans: N/A  Psychiatric Specialty Exam: Physical Exam  Review of Systems  Psychiatric/Behavioral: Positive for depression, hallucinations, substance abuse and suicidal ideas. The patient is nervous/anxious.   All other systems reviewed and are negative.   Blood pressure (!) 152/97, pulse 84, temperature 97.6 F (36.4 C), temperature source Oral, resp. rate 19, height 5\' 1"  (1.549 m), weight 103 kg (227 lb), last menstrual period 05/04/2017, SpO2 99 %.Body mass index is 42.89 kg/m.  General Appearance: Casual  Eye Contact:  Fair  Speech:  Slow  Volume:  Decreased  Mood:  Anxious, Depressed and Dysphoric  Affect:  Flat  Thought Process:  Goal Directed and Descriptions of Associations: Intact  Orientation:  Other:  SELF, SITUATION  Thought Content:  Hallucinations: Auditory and Rumination  Suicidal Thoughts:  Yes.  without intent/plan  Homicidal Thoughts:  No  Memory:  Immediate;   Fair Recent;   Fair Remote;   Fair  Judgement:  Impaired  Insight:  Shallow  Psychomotor Activity:  Decreased  Concentration:  Concentration: Fair and Attention Span:  Fair  Recall:  [employer] of Knowledge:  Fair  Language:  Fair  Akathisia:  No  Handed:  Right  AIMS (if indicated):     Assets:  Desire for Improvement  ADL's:  Intact  Cognition:  WNL  Sleep:  Number of Hours: 6.75      COGNITIVE FEATURES THAT CONTRIBUTE TO RISK:  Closed-mindedness, Polarized thinking and Thought constriction (tunnel vision)    SUICIDE RISK:   Moderate:  Frequent suicidal ideation with limited intensity, and duration, some specificity in terms of plans, no associated intent, good self-control, limited dysphoria/symptomatology, some risk factors present, and identifiable protective factors, including available and accessible social support.  PLAN OF CARE:Restart Zoloft and Abilify for affective sx. Start Remeron 7.5 mg po qhs for insomnia , reports it helped in the past. Provided substance abuse counseling, will benefit from referral to substance abuse treatment program Will repeat BMP - K+ low , creatinine - high - will monitor , MCV- low - reports used to be on iron replacement - will start Feso4 po.    I certify that inpatient services furnished can reasonably be expected to improve the patient's condition.   Ursula Alert, MD 05/19/2017, 12:56 PM

## 2017-05-19 NOTE — Progress Notes (Signed)
D: Patient is a new admit to unit.  She was started back on her blood pressure medication as it remained high.  She reports her depression, hopelessness and anxiety as a 10.  She reports passive SI and contracts for safety on the unit.  Patient is a high fall risk and is ambulating with a walker.  She has intermittent auditory hallucinations, command in nature.  Patient states, "I'm not currently having them."  Patient is now on sliding scale for her diabetes.  She has not been getting up for meals.  She reports poor sleep and she is isolative to her room. A: Continue to monitor medication management and MD orders.  Safety checks completed every 15 minutes per protocol.  Offer support and encouragement as needed. R: Patient is isolative and withdrawn.

## 2017-05-19 NOTE — Progress Notes (Signed)
The patient attended the evening A.A.meeting and was appropriate.  
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
## 2017-05-20 DIAGNOSIS — F419 Anxiety disorder, unspecified: Secondary | ICD-10-CM

## 2017-05-20 DIAGNOSIS — G4700 Insomnia, unspecified: Secondary | ICD-10-CM

## 2017-05-20 LAB — GLUCOSE, CAPILLARY
Glucose-Capillary: 186 mg/dL — ABNORMAL HIGH (ref 65–99)
Glucose-Capillary: 200 mg/dL — ABNORMAL HIGH (ref 65–99)
Glucose-Capillary: 236 mg/dL — ABNORMAL HIGH (ref 65–99)
Glucose-Capillary: 175 mg/dL — ABNORMAL HIGH (ref 65–99)

## 2017-05-20 LAB — BASIC METABOLIC PANEL
ANION GAP: 11 (ref 5–15)
BUN: 10 mg/dL (ref 6–20)
CHLORIDE: 100 mmol/L — AB (ref 101–111)
CO2: 24 mmol/L (ref 22–32)
Calcium: 9.3 mg/dL (ref 8.9–10.3)
Creatinine, Ser: 1.38 mg/dL — ABNORMAL HIGH (ref 0.44–1.00)
GFR calc non Af Amer: 45 mL/min — ABNORMAL LOW (ref >60)
GFR, EST AFRICAN AMERICAN: 53 mL/min — AB (ref >60)
Glucose, Bld: 305 mg/dL — ABNORMAL HIGH (ref 65–99)
POTASSIUM: 3.2 mmol/L — AB (ref 3.5–5.1)
SODIUM: 135 mmol/L (ref 135–145)

## 2017-05-20 MED ORDER — MENTHOL 3 MG MT LOZG
1.0000 | LOZENGE | OROMUCOSAL | Status: DC | PRN
  Administered 2017-05-22 – 2017-05-23 (×2): 3 mg via ORAL

## 2017-05-20 MED ORDER — GUAIFENESIN ER 600 MG PO TB12
1200.0000 mg | ORAL_TABLET | Freq: Two times a day (BID) | ORAL | Status: DC | PRN
  Administered 2017-05-21 – 2017-05-23 (×3): 1200 mg via ORAL
  Filled 2017-05-20 (×3): qty 2

## 2017-05-20 NOTE — Plan of Care (Signed)
Problem: Activity: Goal: Interest or engagement in activities will improve Outcome: Progressing Pt observed interacting with peers in the day room.

## 2017-05-20 NOTE — Progress Notes (Signed)
D.  Pt pleasant on approach, denies complaints at this time.  Minimal interaction.  Pt states that she would like something for anxiety after being asked but forwards little.  Pt is a high fall risk and utilizes a walker due to left sided weakness from previous stroke.  Pt was positive for evening AA group.  Pt denies SI/HI/AVH at this time.  A.  Support and encouragement offered, medication given as ordered.  R.  Pt remains safe on the unit, will continue to monitor.

## 2017-05-20 NOTE — Progress Notes (Signed)
D.  Pt pleasant on approach, complaint of some congestion and sore throat today.  Pt requested cepacol and something for the congestion.  Pt did attend evening AA group, observed engaged in appropriate interaction with peers on the unit.  Pt denies SI/HI/AVH at this time..  Pt also reports that she believes she might have a yeast infection and would like the doctor to evaluate that tomorrow.   A.  Support and encouragement offered, medication given as ordered.  Spoke with NP and orders received for Cepacol, mucinex.  R.  Pt remains safe on the unit, will continue to monitor.

## 2017-05-20 NOTE — Progress Notes (Signed)
[facility] MD Progress Note  05/20/2017 1:06 PM Diamond Rice  MRN:  [id number]   Subjective:  Patient reports that she slept good and has a good appetite. She denies any SI/HI/AVH today and contracts for safety. She denies any medication side effects and denies any withdrawal symptoms.   Objective: Patient is pleasant and cooperative. She is seen using her walker when ambulating. This is not new with the unsteadiness. She has had 3 stokes and is unsteady at home, but ambulates with a walker or cane and no wheelchair. Will continue current treatment and she is requesting residential treatment still. BP is well controlled at this time. Patient's K+ has increased to 3.2 so will continue her K-Dur.   Principal Problem: Schizoaffective disorder ([place]) Diagnosis:   Patient Active Problem List   Diagnosis Date Noted  . Cannabis use disorder, moderate, dependence ([place]) [F12.20] 05/19/2017  . Cocaine use disorder, moderate, dependence ([place]) [F14.20] 05/19/2017  . Schizoaffective disorder ([place]) [F25.9] 10/07/2015  . OSA (obstructive sleep apnea) [G47.33] 06/09/2015  . Vitamin D deficiency [E55.9] 01/06/2015  . Bilateral knee pain [M25.561, M25.562] 01/05/2015  . Numbness and tingling in right hand [R20.0, R20.2] 01/05/2015  . Insomnia [G47.00] 12/07/2014  . Right-sided lacunar infarction ([place]) [I63.9] 09/15/2014  . Left hemiparesis ([place]) [G81.94] 09/15/2014  . Dyspnea [R06.00]   . Back pain [M54.9] 09/11/2013  . Psychoactive substance-induced organic mood disorder ([place]) [E83.15, F06.30] 05/28/2013  . Cocaine abuse with cocaine-induced mood disorder ([place]) [F14.14] 05/28/2013  . Cannabis dependence with cannabis-induced anxiety disorder ([place]) [F12.280] 05/28/2013  . Morbid obesity with BMI of 45.0-49.9, adult ([place]) [E66.01, Z68.42] 04/25/2012  . Chest pain [R07.9] 04/17/2012  . Hypertension [I10] 04/17/2012  . Diabetes mellitus type 2 with complications, uncontrolled ([place]) [E11.8, E11.65] 04/17/2012   . Hyperlipidemia [E78.5] 04/17/2012  . Tobacco use [Z72.0] 04/17/2012   Total Time spent with patient: 25 minutes  Past Psychiatric History: See H&P  Past Medical History:  Past Medical History:  Diagnosis Date  . Anginal pain ([place])   . Anxiety   . Chronic lower back pain 04/17/2012   "just got over some; catched when I walked"  . COPD (chronic obstructive pulmonary disease) ([place])   . Depression   . Headache(784.0) 04/17/2012   "~ qod; lately waking up in am w/one"  . Heart attack ([place])   . High cholesterol   . Hypertension   . Migraines 04/17/2012  . Obesity   . Shortness of breath 04/17/2012   "all the time"  . Sleep apnea   . Stroke ([place])   . Type II diabetes mellitus ([place]) 08/28/2002    Past Surgical History:  Procedure Laterality Date  . CARDIAC CATHETERIZATION  ~ 2011   Family History:  Family History  Problem Relation Age of Onset  . Stroke Brother 75  . Stroke Brother 70  . Heart attack Mother 49  . Stroke Father 41   Family Psychiatric  History: See H&P Social History:  History  Alcohol Use No    Comment: rare     History  Drug Use  . Types: Marijuana, "Crack" cocaine    Social History   Social History  . Marital status: Single    Spouse name: N/A  . Number of children: N/A  . Years of education: N/A   Social History Main Topics  . Smoking status: Current Some Day Smoker    Packs/day: 0.00    Years: 10.00    Types: Cigarettes    Last attempt to  quit: 12/07/2014  . Smokeless tobacco: Never Used     Comment: smoke a cigarette a day   . Alcohol use No     Comment: rare  . Drug use: Yes    Types: Marijuana, "Crack" cocaine  . Sexual activity: Not Currently   Other Topics Concern  . 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   Additional Social History:                         Sleep: Good  Appetite:  Good  Current Medications: Current Facility-Administered Medications   Medication Dose Route Frequency Provider Last Rate Last Dose  . alum & mag hydroxide-simeth (MAALOX/MYLANTA) 200-200-20 MG/5ML suspension 30 mL  30 mL Oral Q4H PRN Rankin, Shuvon B, NP      . amLODipine (NORVASC) tablet 10 mg  10 mg Oral Daily Money, Lowry Ram, FNP   10 mg at 05/20/17 0818  . ARIPiprazole (ABILIFY) tablet 10 mg  10 mg Oral Daily Money, Lowry Ram, FNP   10 mg at 05/20/17 0819  . ferrous sulfate tablet 325 mg  325 mg Oral BID WC Ursula Alert, MD   325 mg at 05/20/17 0819  . fluticasone (FLONASE) 50 MCG/ACT nasal spray 1 spray  1 spray Each Nare BID Money, Lowry Ram, FNP   1 spray at 05/20/17 0820  . hydrOXYzine (ATARAX/VISTARIL) tablet 25 mg  25 mg Oral TID PRN Rozetta Nunnery, NP   25 mg at 05/19/17 2132  . ibuprofen (ADVIL,MOTRIN) tablet 600 mg  600 mg Oral TID PRN Lu Duffel, Justina A, NP   600 mg at 05/19/17 1816  . insulin aspart (novoLOG) injection 0-20 Units  0-20 Units Subcutaneous TID WC Ursula Alert, MD   4 Units at 05/20/17 1220  . insulin aspart (novoLOG) injection 0-5 Units  0-5 Units Subcutaneous QHS Ursula Alert, MD   2 Units at 05/19/17 2128  . insulin glargine (LANTUS) injection 13 Units  13 Units Subcutaneous Daily Rankin, Shuvon B, NP   13 Units at 05/20/17 0827  . lisinopril (PRINIVIL,ZESTRIL) tablet 20 mg  20 mg Oral Daily Money, Lowry Ram, FNP   20 mg at 05/20/17 0819  . magnesium hydroxide (MILK OF MAGNESIA) suspension 30 mL  30 mL Oral Daily PRN Rankin, Shuvon B, NP      . potassium chloride SA (K-DUR,KLOR-CON) CR tablet 20 mEq  20 mEq Oral Daily Money, Lowry Ram, FNP   20 mEq at 05/20/17 0819  . sertraline (ZOLOFT) tablet 50 mg  50 mg Oral Daily Money, Lowry Ram, FNP   50 mg at 05/20/17 0818  . traZODone (DESYREL) tablet 50 mg  50 mg Oral QHS,MR X 1 Lindon Romp A, NP   50 mg at 05/19/17 2130    Lab Results:  Results for orders placed or performed during the hospital encounter of 05/18/17 (from the past 48 hour(s))  Glucose, capillary     Status: Abnormal    Collection Time: 05/18/17  9:49 PM  Result Value Ref Range   Glucose-Capillary 195 (H) 65 - 99 mg/dL  Glucose, capillary     Status: Abnormal   Collection Time: 05/19/17  6:13 AM  Result Value Ref Range   Glucose-Capillary 141 (H) 65 - 99 mg/dL  Glucose, capillary     Status: Abnormal   Collection Time: 05/19/17  4:49 PM  Result Value Ref Range   Glucose-Capillary  177 (H) 65 - 99 mg/dL  Glucose, capillary     Status: Abnormal   Collection Time: 05/19/17  9:24 PM  Result Value Ref Range   Glucose-Capillary 246 (H) 65 - 99 mg/dL   Comment 1 Notify RN   Glucose, capillary     Status: Abnormal   Collection Time: 05/20/17  5:53 AM  Result Value Ref Range   Glucose-Capillary 186 (H) 65 - 99 mg/dL   Comment 1 Notify RN   Basic metabolic panel     Status: Abnormal   Collection Time: 05/20/17  8:53 AM  Result Value Ref Range   Sodium 135 135 - 145 mmol/L   Potassium 3.2 (L) 3.5 - 5.1 mmol/L   Chloride 100 (L) 101 - 111 mmol/L   CO2 24 22 - 32 mmol/L   Glucose, Bld 305 (H) 65 - 99 mg/dL   BUN 10 6 - 20 mg/dL   Creatinine, Ser 8.26 (H) 0.44 - 1.00 mg/dL   Calcium 9.3 8.9 - 38.0 mg/dL   GFR calc non Af Amer 45 (L) >60 mL/min   GFR calc Af Amer 53 (L) >60 mL/min    Comment: (NOTE) The eGFR has been calculated using the CKD EPI equation. This calculation has not been validated in all clinical situations. eGFR's persistently <60 mL/min signify possible Chronic Kidney Disease.    Anion gap 11 5 - 15    Comment: Performed at [facility], [address], [place], [place] [address]  Glucose, capillary     Status: Abnormal   Collection Time: 05/20/17 11:50 AM  Result Value Ref Range   Glucose-Capillary 200 (H) 65 - 99 mg/dL    Blood Alcohol level:  Lab Results  Component Value Date   ETH <5 05/17/2017   ETH <5 10/07/2015    Metabolic Disorder Labs: Lab Results  Component Value Date   HGBA1C 12.3 (H) 10/09/2015   MPG 306 10/09/2015   MPG 249 (H)  08/28/2014   No results found for: PROLACTIN Lab Results  Component Value Date   CHOL 196 08/28/2014   TRIG 129 08/28/2014   HDL 45 08/28/2014   CHOLHDL 4.4 08/28/2014   VLDL 26 08/28/2014   LDLCALC 125 (H) 08/28/2014   LDLCALC 107 (H) 09/04/2013    Physical Findings: AIMS: Facial and Oral Movements Muscles of Facial Expression: None, normal Lips and Perioral Area: None, normal Jaw: None, normal Tongue: None, normal,Extremity Movements Upper (arms, wrists, hands, fingers): None, normal Lower (legs, knees, ankles, toes): None, normal, Trunk Movements Neck, shoulders, hips: None, normal, Overall Severity Severity of abnormal movements (highest score from questions above): None, normal Incapacitation due to abnormal movements: None, normal Patient's awareness of abnormal movements (rate only patient's report): No Awareness, Dental Status Current problems with teeth and/or dentures?: No Does patient usually wear dentures?: No  CIWA:    COWS:     Musculoskeletal: Strength & Muscle Tone: within normal limits Gait & Station: unsteady Patient leans: N/A  Psychiatric Specialty Exam: Physical Exam  Nursing note and vitals reviewed. Constitutional: She is oriented to person, place, and time. She appears well-developed and well-nourished.  Respiratory: Effort normal.  Musculoskeletal:  Weakness and unsteady gait due to h/o 3 strokes  Neurological: She is alert and oriented to person, place, and time.    Review of Systems  Constitutional: Negative.   HENT: Negative.   Eyes: Negative.   Respiratory: Negative.   Cardiovascular: Negative.   Gastrointestinal: Negative.   Genitourinary: Negative.   Musculoskeletal:  Negative.   Skin: Negative.   Neurological: Negative.   Endo/Heme/Allergies: Negative.     Blood pressure 133/84, pulse (!) 102, temperature 98.8 F (37.1 C), temperature source Oral, resp. rate 18, height '5\' 1"'$  (1.549 m), weight 103 kg (227 lb), last menstrual  period 05/04/2017, SpO2 99 %.Body mass index is 42.89 kg/m.  General Appearance: Casual  Eye Contact:  Good  Speech:  Clear and Coherent and Normal Rate  Volume:  Normal  Mood:  Depressed  Affect:  Flat  Thought Process:  Coherent and Descriptions of Associations: Intact  Orientation:  Full (Time, Place, and Person)  Thought Content:  WDL  Suicidal Thoughts:  No  Homicidal Thoughts:  No  Memory:  Immediate;   Good Recent;   Good  Judgement:  Fair  Insight:  Good  Psychomotor Activity:  Normal  Concentration:  Concentration: Good and Attention Span: Good  Recall:  Good  Fund of Knowledge:  Good  Language:  Good  Akathisia:  No  Handed:  Right  AIMS (if indicated):     Assets:  Desire for Improvement Financial Resources/Insurance Housing Social Support  ADL's:  Intact  Cognition:  WNL  Sleep:  Number of Hours: 6.25     Treatment Plan Summary: Daily contact with patient to assess and evaluate symptoms and progress in treatment, Medication management and Plan is to:   -Continue Zoloft 50 mg PO Daily for mood stability -Continue Trazodone 50 mg PO QHS PRN for insomnia -Continue K-Dur for hypokalemia - titrate as necessary -Continue Abilify 10 mg PO Daily for mood stability -Continue Vistaril 25 mg PO TID PRN for anxiety -Encourage group therapy participation  Lewis Shock, FNP 05/20/2017, 1:06 PM   Agree with NP Progress Note

## 2017-05-20 NOTE — Progress Notes (Cosign Needed)
DAR NOTE: Pt present with flat affect and depressed mood in the unit. Pt has been isolating in the room, complained of left leg pain. Pt using walker to ambulate and is on fall precaution. Pt stayed back in unit during lunch, took all her meds as scheduled. As per self inventory, pt had a fair night sleep, poor appetite, low energy, and poor concentration. Pt rate depression at 10, hopeless ness at 10, and anxiety at 10. Pt's safety ensured with 15 minute and environmental checks. Pt currently denies SI/HI and A/V hallucinations. Pt verbally agrees to seek staff if SI/HI or A/VH occurs and to consult with staff before acting on these thoughts. Will continue POC.

## 2017-05-20 NOTE — Progress Notes (Signed)
The patient attended the evening A.A.meeting and was appropriate.  

## 2017-05-20 NOTE — Progress Notes (Signed)
Client participated in the afternoon Psychoeducational group on Self care. Client stated that it has been difficult for her to find self care skills before coming to the hospital but that her goal is to learn how to love herself more.

## 2017-05-21 DIAGNOSIS — Z590 Homelessness: Secondary | ICD-10-CM

## 2017-05-21 DIAGNOSIS — Z915 Personal history of self-harm: Secondary | ICD-10-CM

## 2017-05-21 LAB — GLUCOSE, CAPILLARY
GLUCOSE-CAPILLARY: 171 mg/dL — AB (ref 65–99)
GLUCOSE-CAPILLARY: 266 mg/dL — AB (ref 65–99)
Glucose-Capillary: 217 mg/dL — ABNORMAL HIGH (ref 65–99)
Glucose-Capillary: 191 mg/dL — ABNORMAL HIGH (ref 65–99)

## 2017-05-21 NOTE — Tx Team (Signed)
Interdisciplinary Treatment and Diagnostic Plan Update  05/21/2017 Time of Session: [place] MRN: [id number]  Principal Diagnosis: Schizoaffective disorder [facility])  Secondary Diagnoses: Principal Problem:   Schizoaffective disorder ([place]) Active Problems:   OSA (obstructive sleep apnea)   Cannabis use disorder, moderate, dependence (HCC)   Cocaine use disorder, moderate, dependence (HCC)   Current Medications:  Current Facility-Administered Medications  Medication Dose Route Frequency Provider Last Rate Last Dose  . alum & mag hydroxide-simeth (MAALOX/MYLANTA) 200-200-20 MG/5ML suspension 30 mL  30 mL Oral Q4H PRN Rankin, Shuvon B, NP      . amLODipine (NORVASC) tablet 10 mg  10 mg Oral Daily Money, Lowry Ram, FNP   10 mg at 05/21/17 0807  . ARIPiprazole (ABILIFY) tablet 10 mg  10 mg Oral Daily Money, Lowry Ram, FNP   10 mg at 05/21/17 2637  . ferrous sulfate tablet 325 mg  325 mg Oral BID WC Ursula Alert, MD   325 mg at 05/21/17 0808  . fluticasone (FLONASE) 50 MCG/ACT nasal spray 1 spray  1 spray Each Nare BID Money, Lowry Ram, FNP   1 spray at 05/21/17 8588  . guaiFENesin (MUCINEX) 12 hr tablet 1,200 mg  1,200 mg Oral BID PRN Lindon Romp A, NP      . hydrOXYzine (ATARAX/VISTARIL) tablet 25 mg  25 mg Oral TID PRN Lindon Romp A, NP   25 mg at 05/21/17 0820  . ibuprofen (ADVIL,MOTRIN) tablet 600 mg  600 mg Oral TID PRN Lu Duffel, Justina A, NP   600 mg at 05/20/17 2132  . insulin aspart (novoLOG) injection 0-20 Units  0-20 Units Subcutaneous TID WC Ursula Alert, MD   4 Units at 05/21/17 0627  . insulin aspart (novoLOG) injection 0-5 Units  0-5 Units Subcutaneous QHS Ursula Alert, MD   2 Units at 05/19/17 2128  . insulin glargine (LANTUS) injection 13 Units  13 Units Subcutaneous Daily Rankin, Shuvon B, NP   13 Units at 05/21/17 0817  . lisinopril (PRINIVIL,ZESTRIL) tablet 20 mg  20 mg Oral Daily Money, Lowry Ram, FNP   20 mg at 05/21/17 5027  . magnesium hydroxide  (MILK OF MAGNESIA) suspension 30 mL  30 mL Oral Daily PRN Rankin, Shuvon B, NP   30 mL at 05/21/17 0819  . menthol-cetylpyridinium (CEPACOL) lozenge 3 mg  1 lozenge Oral PRN Lindon Romp A, NP      . potassium chloride SA (K-DUR,KLOR-CON) CR tablet 20 mEq  20 mEq Oral Daily Money, Lowry Ram, FNP   20 mEq at 05/21/17 7412  . sertraline (ZOLOFT) tablet 50 mg  50 mg Oral Daily Money, Lowry Ram, FNP   50 mg at 05/21/17 0809  . traZODone (DESYREL) tablet 50 mg  50 mg Oral QHS,MR X 1 Lindon Romp A, NP   50 mg at 05/20/17 2308   PTA Medications: Prescriptions Prior to Admission  Medication Sig Dispense Refill Last Dose  . acetaminophen (TYLENOL) 325 MG tablet Take 2 tablets (650 mg total) by mouth every 6 (six) hours as needed for headache. (Patient not taking: Reported on 05/17/2017)   Unknown at Unknown time  . glucose blood (TRUETEST TEST) test strip 1 each by Other route 3 (three) times daily. Use as instructed: For blood sugar checks 100 each 3 Unknown at Unknown time  . Insulin Glargine (TOUJEO SOLOSTAR) 300 UNIT/ML SOPN Inject 13 Units into the skin daily. For diabetes management (Patient not taking: Reported on 05/17/2017) 1.5 mL 0 Unknown at Unknown time  . Insulin  Pen Needle (B-D ULTRAFINE III SHORT PEN) 31G X 8 MM MISC 1 application by Does not apply route daily. For blood sugar checks 1 each 0 Unknown at Unknown time  . Vitamin D, Ergocalciferol, (DRISDOL) 50000 units CAPS capsule Take 1 capsule (50,000 Units total) by mouth every 7 (seven) days. For 12 total weeks: For bone health (Patient not taking: Reported on 05/17/2017) 1 capsule 0 Unknown at Unknown time    Patient Stressors: Financial difficulties Medication change or noncompliance Occupational concerns Substance abuse Traumatic event  Patient Strengths: Capable of independent living Communication skills  Treatment Modalities: Medication Management, Group therapy, Case management,  1 to 1 session with clinician, Psychoeducation,  Recreational therapy.   Physician Treatment Plan for Primary Diagnosis: Schizoaffective disorder ([place]) Long Term Goal(s): Improvement in symptoms so as ready for discharge Improvement in symptoms so as ready for discharge   Short Term Goals: Ability to identify and develop effective coping behaviors will improve Ability to maintain clinical measurements within normal limits will improve Compliance with prescribed medications will improve Ability to verbalize feelings will improve Ability to disclose and discuss suicidal ideas  Medication Management: Evaluate patient's response, side effects, and tolerance of medication regimen.  Therapeutic Interventions: 1 to 1 sessions, Unit Group sessions and Medication administration.  Evaluation of Outcomes: Progressing  Physician Treatment Plan for Secondary Diagnosis: Principal Problem:   Schizoaffective disorder ([place]) Active Problems:   OSA (obstructive sleep apnea)   Cannabis use disorder, moderate, dependence (HCC)   Cocaine use disorder, moderate, dependence ([place])  Long Term Goal(s): Improvement in symptoms so as ready for discharge Improvement in symptoms so as ready for discharge   Short Term Goals: Ability to identify and develop effective coping behaviors will improve Ability to maintain clinical measurements within normal limits will improve Compliance with prescribed medications will improve Ability to verbalize feelings will improve Ability to disclose and discuss suicidal ideas     Medication Management: Evaluate patient's response, side effects, and tolerance of medication regimen.  Therapeutic Interventions: 1 to 1 sessions, Unit Group sessions and Medication administration.  Evaluation of Outcomes: Progressing   RN Treatment Plan for Primary Diagnosis: Schizoaffective disorder ([place]) Long Term Goal(s): Knowledge of disease and therapeutic regimen to maintain health will improve  Short Term Goals: Ability to remain  free from injury will improve, Ability to verbalize frustration and anger appropriately will improve and Ability to identify and develop effective coping behaviors will improve  Medication Management: RN will administer medications as ordered by provider, will assess and evaluate patient's response and provide education to patient for prescribed medication. RN will report any adverse and/or side effects to prescribing provider.  Therapeutic Interventions: 1 on 1 counseling sessions, Psychoeducation, Medication administration, Evaluate responses to treatment, Monitor vital signs and CBGs as ordered, Perform/monitor CIWA, COWS, AIMS and Fall Risk screenings as ordered, Perform wound care treatments as ordered.  Evaluation of Outcomes: Progressing   LCSW Treatment Plan for Primary Diagnosis: Schizoaffective disorder ([place]) Long Term Goal(s): Safe transition to appropriate next level of care at discharge, Engage patient in therapeutic group addressing interpersonal concerns.  Short Term Goals: Engage patient in aftercare planning with referrals and resources, Facilitate patient progression through stages of change regarding substance use diagnoses and concerns and Identify triggers associated with mental health/substance abuse issues  Therapeutic Interventions: Assess for all discharge needs, 1 to 1 time with Social worker, Explore available resources and support systems, Assess for adequacy in community support network, Educate family and significant other(s) on suicide prevention,  Complete Psychosocial Assessment, Interpersonal group therapy.  Evaluation of Outcomes: Progressing   Progress in Treatment: Attending groups: Yes. Participating in groups: Yes. Taking medication as prescribed: Yes. Toleration medication: Yes. Family/Significant other contact made:SPE completed with pt.  Patient understands diagnosis: Yes. Discussing patient identified problems/goals with staff: Yes. Medical  problems stabilized or resolved: Yes. Denies suicidal/homicidal ideation: Yes. Issues/concerns per patient self-inventory: No. Other: n/a   New problem(s) identified: No, Describe:  n/a  New Short Term/Long Term Goal(s): medication management for mood stabilization, development of comprehensive mental wellness/sobriety plan, elimination of SI thoughts.   Discharge Plan or Barriers: CSW assessing. [facility] likely-will need appt prior to discharge. [place] pamphlet provided to pt for additional community supports.   Patient Goal: "to get help for my mental problems and substance use. I'm not sure if I want inpatient or outpatient right now. I have to think about it more."   Reason for Continuation of Hospitalization: Anxiety Depression Medication stabilization Suicidal ideation  Estimated Length of Stay: Tuesday, 05/22/17  Attendees: Patient: 05/21/2017 8:21 AM  Physician: Dr. Shea Evans MD; Dr. Parke Poisson MD 05/21/2017 8:21 AM  Nursing: August Albino RN 05/21/2017 8:21 AM  RN Care Manager: Lars Pinks CM 05/21/2017 8:21 AM  Social Worker: Press photographer, LCSW 05/21/2017 8:21 AM  Recreational Therapist: x 05/21/2017 8:21 AM  Other: Ricky Ala NP 05/21/2017 8:21 AM  Other:  05/21/2017 8:21 AM  Other: 05/21/2017 8:21 AM    Scribe for Treatment Team: Kimber Relic Smart, LCSW 05/21/2017 8:21 AM

## 2017-05-21 NOTE — Progress Notes (Signed)
Recreation Therapy Notes  Date: 05/21/17 Time: 0930 Location: [address] Dayroom  Group Topic: Stress Management  Goal Area(s) Addresses:  Patient will verbalize importance of using healthy stress management.  Patient will identify positive emotions associated with healthy stress management.   Intervention: Stress Management  Activity :  Guided Imagery.  LRT introduced the stress management technique of guided imagery.  LRT read a script to allow patients to take a journey to their peaceful place.  Patients were to follow along as LRT read script to engage in the meditation.  Education:  Stress Management, Discharge Planning.   Education Outcome: Acknowledges edcuation/In group clarification offered/Needs additional education  Clinical Observations/Feedback: Pt did not attend group.   Victorino Sparrow, LRT/CTRS         Victorino Sparrow A 05/21/2017 12:25 PM

## 2017-05-21 NOTE — BHH Suicide Risk Assessment (Signed)
[place] [facility] INPATIENT:  Family/Significant Other Suicide Prevention Education  Suicide Prevention Education:  Patient Refusal for Family/Significant Other Suicide Prevention Education: The patient Diamond Rice has refused to provide written consent for family/significant other to be provided Family/Significant Other Suicide Prevention Education during admission and/or prior to discharge.  Physician notified.  SPE completed with pt, as pt refused to consent to family contact. SPI pamphlet provided to pt and pt was encouraged to share information with support network, ask questions, and talk about any concerns relating to SPE. Pt denies access to guns/firearms and verbalized understanding of information provided. Mobile Crisis information also provided to pt.   Izadora Roehr N Smart LCSW 05/21/2017, 12:27 PM

## 2017-05-21 NOTE — BHH Group Notes (Signed)
[facility] Mental Health Association Group Therapy 05/21/2017 1:15pm  Type of Therapy: Mental Health Association Presentation  Participation Level: Active  Participation Quality: Attentive  Affect: Appropriate  Cognitive: Oriented  Insight: Developing/Improving  Engagement in Therapy: Engaged  Modes of Intervention: Discussion, Education and Socialization  Summary of Progress/Problems: [place] ([place]) Speaker came to talk about his personal journey with mental health. The pt processed ways by which to relate to the speaker. [place] speaker provided handouts and educational information pertaining to groups and services offered by the [facility]. Pt was engaged in speaker's presentation and was receptive to resources provided.    [employer], LCSW 05/21/2017 2:12 PM

## 2017-05-21 NOTE — Progress Notes (Signed)
[facility] MD Progress Note  05/21/2017 4:12 PM Diamond Rice  MRN:  [id number]   Subjective: Diamond Rice reports, "I'm feeling very depressed today. I could not go to lunch because I took something for my bowels.I'm going through a lot. I'm homeless, can't trust no body. That is why I smoked weed to help my depression. I have thoughts of hurting myself all the time. That was why I overdosed in March of this year, 2018. I'm still hearing voices. My depression is #10 out of 10 today".  Objective: Diamond Rice is seen, chart reviewed. She is lying down in her bed in her room. She says she has not been out of her room today because she feels very depressed. She also says she took something to help her use the bathroom & wants to stay close to the bathroom in the event that she has to go.  She uses walker to aid her mobility due to lower extremity weakness. This is not new with the unsteadiness. She has had 3 stokes and is unsteady at home, but ambulates with a walker or cane. Analyssa continues to endorse passive suicidal thoughts without plans or intent. She is endorsing auditory hallucinations & rates her depression #10. Will continue current treatment plan & continue to monitor her response as she has not been on her mental health medications since march of this year, she states. She is requesting residential treatment center for her substance addiction. BP is well controlled at this time. Patient's K+ has increased to 3.2 so will continue her K-Dur. She does not appear to be responding to any internal stimuli. Staff continues to provide support.  Principal Problem: Schizoaffective disorder ([place]) Diagnosis:   Patient Active Problem List   Diagnosis Date Noted  . Cannabis use disorder, moderate, dependence ([place]) [F12.20] 05/19/2017  . Cocaine use disorder, moderate, dependence ([place]) [F14.20] 05/19/2017  . Schizoaffective disorder ([place]) [F25.9] 10/07/2015  . OSA (obstructive sleep apnea) [G47.33] 06/09/2015  . Vitamin  D deficiency [E55.9] 01/06/2015  . Bilateral knee pain [M25.561, M25.562] 01/05/2015  . Numbness and tingling in right hand [R20.0, R20.2] 01/05/2015  . Insomnia [G47.00] 12/07/2014  . Right-sided lacunar infarction ([place]) [I63.9] 09/15/2014  . Left hemiparesis ([place]) [G81.94] 09/15/2014  . Dyspnea [R06.00]   . Back pain [M54.9] 09/11/2013  . Psychoactive substance-induced organic mood disorder ([place]) [W09.81, F06.30] 05/28/2013  . Cocaine abuse with cocaine-induced mood disorder ([place]) [F14.14] 05/28/2013  . Cannabis dependence with cannabis-induced anxiety disorder ([place]) [F12.280] 05/28/2013  . Morbid obesity with BMI of 45.0-49.9, adult ([place]) [E66.01, Z68.42] 04/25/2012  . Chest pain [R07.9] 04/17/2012  . Hypertension [I10] 04/17/2012  . Diabetes mellitus type 2 with complications, uncontrolled ([place]) [E11.8, E11.65] 04/17/2012  . Hyperlipidemia [E78.5] 04/17/2012  . Tobacco use [Z72.0] 04/17/2012   Total Time spent with patient: 15 minutes  Past Psychiatric History: See H&P  Past Medical History:  Past Medical History:  Diagnosis Date  . Anginal pain ([place])   . Anxiety   . Chronic lower back pain 04/17/2012   "just got over some; catched when I walked"  . COPD (chronic obstructive pulmonary disease) ([place])   . Depression   . Headache(784.0) 04/17/2012   "~ qod; lately waking up in am w/one"  . Heart attack ([place])   . High cholesterol   . Hypertension   . Migraines 04/17/2012  . Obesity   . Shortness of breath 04/17/2012   "all the time"  . Sleep apnea   . Stroke ([place])   . Type  II diabetes mellitus ([place]) 08/28/2002    Past Surgical History:  Procedure Laterality Date  . CARDIAC CATHETERIZATION  ~ 2011   Family History:  Family History  Problem Relation Age of Onset  . Stroke Brother 52  . Stroke Brother 70  . Heart attack Mother 68  . Stroke Father 70   Family Psychiatric  History: See H&P  Social History:  History  Alcohol Use No    Comment: rare     History   Drug Use  . Types: Marijuana, "Crack" cocaine    Social History   Social History  . Marital status: Single    Spouse name: N/A  . Number of children: N/A  . Years of education: N/A   Social History Main Topics  . Smoking status: Current Some Day Smoker    Packs/day: 0.00    Years: 10.00    Types: Cigarettes    Last attempt to quit: 12/07/2014  . Smokeless tobacco: Never Used     Comment: smoke a cigarette a day   . Alcohol use No     Comment: rare  . Drug use: Yes    Types: Marijuana, "Crack" cocaine  . Sexual activity: Not Currently   Other Topics Concern  . 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   Additional Social History:   Sleep: Good  Appetite:  Good  Current Medications: Current Facility-Administered Medications  Medication Dose Route Frequency Provider Last Rate Last Dose  . alum & mag hydroxide-simeth (MAALOX/MYLANTA) 200-200-20 MG/5ML suspension 30 mL  30 mL Oral Q4H PRN Rankin, Shuvon B, NP      . amLODipine (NORVASC) tablet 10 mg  10 mg Oral Daily Money, Lowry Ram, FNP   10 mg at 05/21/17 0807  . ARIPiprazole (ABILIFY) tablet 10 mg  10 mg Oral Daily Money, Lowry Ram, FNP   10 mg at 05/21/17 1308  . ferrous sulfate tablet 325 mg  325 mg Oral BID WC Ursula Alert, MD   325 mg at 05/21/17 0808  . fluticasone (FLONASE) 50 MCG/ACT nasal spray 1 spray  1 spray Each Nare BID Money, Lowry Ram, FNP   1 spray at 05/21/17 6578  . guaiFENesin (MUCINEX) 12 hr tablet 1,200 mg  1,200 mg Oral BID PRN Lindon Romp A, NP      . hydrOXYzine (ATARAX/VISTARIL) tablet 25 mg  25 mg Oral TID PRN Lindon Romp A, NP   25 mg at 05/21/17 0820  . ibuprofen (ADVIL,MOTRIN) tablet 600 mg  600 mg Oral TID PRN Lu Duffel, Justina A, NP   600 mg at 05/20/17 2132  . insulin aspart (novoLOG) injection 0-20 Units  0-20 Units Subcutaneous TID WC Ursula Alert, MD   7 Units at 05/21/17 1221  . insulin aspart (novoLOG) injection 0-5 Units  0-5  Units Subcutaneous QHS Ursula Alert, MD   2 Units at 05/19/17 2128  . insulin glargine (LANTUS) injection 13 Units  13 Units Subcutaneous Daily Rankin, Shuvon B, NP   13 Units at 05/21/17 0817  . lisinopril (PRINIVIL,ZESTRIL) tablet 20 mg  20 mg Oral Daily Money, Lowry Ram, FNP   20 mg at 05/21/17 4696  . magnesium hydroxide (MILK OF MAGNESIA) suspension 30 mL  30 mL Oral Daily PRN Rankin, Shuvon B, NP   30 mL at 05/21/17 0819  . menthol-cetylpyridinium (CEPACOL) lozenge 3 mg  1 lozenge Oral PRN Rozetta Nunnery, NP      .  potassium chloride SA (K-DUR,KLOR-CON) CR tablet 20 mEq  20 mEq Oral Daily Money, Lowry Ram, FNP   20 mEq at 05/21/17 1638  . sertraline (ZOLOFT) tablet 50 mg  50 mg Oral Daily Money, Lowry Ram, FNP   50 mg at 05/21/17 0809  . traZODone (DESYREL) tablet 50 mg  50 mg Oral QHS,MR X 1 Lindon Romp A, NP   50 mg at 05/20/17 2308    Lab Results:  Results for orders placed or performed during the hospital encounter of 05/18/17 (from the past 48 hour(s))  Glucose, capillary     Status: Abnormal   Collection Time: 05/19/17  4:49 PM  Result Value Ref Range   Glucose-Capillary 177 (H) 65 - 99 mg/dL  Glucose, capillary     Status: Abnormal   Collection Time: 05/19/17  9:24 PM  Result Value Ref Range   Glucose-Capillary 246 (H) 65 - 99 mg/dL   Comment 1 Notify RN   Glucose, capillary     Status: Abnormal   Collection Time: 05/20/17  5:53 AM  Result Value Ref Range   Glucose-Capillary 186 (H) 65 - 99 mg/dL   Comment 1 Notify RN   Basic metabolic panel     Status: Abnormal   Collection Time: 05/20/17  8:53 AM  Result Value Ref Range   Sodium 135 135 - 145 mmol/L   Potassium 3.2 (L) 3.5 - 5.1 mmol/L   Chloride 100 (L) 101 - 111 mmol/L   CO2 24 22 - 32 mmol/L   Glucose, Bld 305 (H) 65 - 99 mg/dL   BUN 10 6 - 20 mg/dL   Creatinine, Ser 1.38 (H) 0.44 - 1.00 mg/dL   Calcium 9.3 8.9 - 10.3 mg/dL   GFR calc non Af Amer 45 (L) >60 mL/min   GFR calc Af Amer 53 (L) >60 mL/min     Comment: (NOTE) The eGFR has been calculated using the CKD EPI equation. This calculation has not been validated in all clinical situations. eGFR's persistently <60 mL/min signify possible Chronic Kidney Disease.    Anion gap 11 5 - 15    Comment: Performed at [facility], [place] [address] [place], [place] [address]  Glucose, capillary     Status: Abnormal   Collection Time: 05/20/17 11:50 AM  Result Value Ref Range   Glucose-Capillary 200 (H) 65 - 99 mg/dL  Glucose, capillary     Status: Abnormal   Collection Time: 05/20/17  5:10 PM  Result Value Ref Range   Glucose-Capillary 236 (H) 65 - 99 mg/dL  Glucose, capillary     Status: Abnormal   Collection Time: 05/20/17  9:03 PM  Result Value Ref Range   Glucose-Capillary 175 (H) 65 - 99 mg/dL   Comment 1 Notify RN   Glucose, capillary     Status: Abnormal   Collection Time: 05/21/17  5:55 AM  Result Value Ref Range   Glucose-Capillary 171 (H) 65 - 99 mg/dL   Comment 1 Notify RN   Glucose, capillary     Status: Abnormal   Collection Time: 05/21/17 12:04 PM  Result Value Ref Range   Glucose-Capillary 217 (H) 65 - 99 mg/dL   Comment 1 Notify RN    Comment 2 Document in Chart    Blood Alcohol level:  Lab Results  Component Value Date   ETH <5 05/17/2017   ETH <5 93/57/0177   Metabolic Disorder Labs: Lab Results  Component Value Date   HGBA1C 12.3 (H) 10/09/2015  MPG 306 10/09/2015   MPG 249 (H) 08/28/2014   No results found for: PROLACTIN Lab Results  Component Value Date   CHOL 196 08/28/2014   TRIG 129 08/28/2014   HDL 45 08/28/2014   CHOLHDL 4.4 08/28/2014   VLDL 26 08/28/2014   LDLCALC 125 (H) 08/28/2014   LDLCALC 107 (H) 09/04/2013   Physical Findings: AIMS: Facial and Oral Movements Muscles of Facial Expression: None, normal Lips and Perioral Area: None, normal Jaw: None, normal Tongue: None, normal,Extremity Movements Upper (arms, wrists, hands, fingers): None, normal Lower (legs,  knees, ankles, toes): None, normal, Trunk Movements Neck, shoulders, hips: None, normal, Overall Severity Severity of abnormal movements (highest score from questions above): None, normal Incapacitation due to abnormal movements: None, normal Patient's awareness of abnormal movements (rate only patient's report): No Awareness, Dental Status Current problems with teeth and/or dentures?: No Does patient usually wear dentures?: No  CIWA:  CIWA-Ar Total: 1 COWS:  COWS Total Score: 2  Musculoskeletal: Strength & Muscle Tone: within normal limits Gait & Station: unsteady Patient leans: N/A  Psychiatric Specialty Exam: Physical Exam  Nursing note and vitals reviewed. Constitutional: She is oriented to person, place, and time. She appears well-developed and well-nourished.  Respiratory: Effort normal.  Musculoskeletal:  Weakness and unsteady gait due to h/o 3 strokes  Neurological: She is alert and oriented to person, place, and time.    Review of Systems  Constitutional: Negative.   HENT: Negative.   Eyes: Negative.   Respiratory: Negative.   Cardiovascular: Negative.   Gastrointestinal: Negative.   Genitourinary: Negative.   Musculoskeletal: Negative.   Skin: Negative.   Neurological: Negative.   Endo/Heme/Allergies: Negative.     Blood pressure 132/80, pulse 83, temperature 98.2 F (36.8 C), temperature source Oral, resp. rate 16, height '5\' 1"'$  (1.549 m), weight 103 kg (227 lb), last menstrual period 05/04/2017, SpO2 99 %.Body mass index is 42.89 kg/m.  General Appearance: Casual  Eye Contact:  Fair  Speech:  Clear and Coherent and Normal Rate  Volume:  Normal  Mood:  Depressed  Affect:  Flat  Thought Process:  Coherent and Descriptions of Associations: Intact  Orientation:  Full (Time, Place, and Person)  Thought Content:  Rumination  Suicidal Thoughts:  Yes.  without intent/plan  Homicidal Thoughts:  Denies  Memory:  Immediate;   Good Recent;   Good  Judgement:  Fair   Insight:  Fair  Psychomotor Activity:  Decreased  Concentration:  Concentration: Good and Attention Span: Good  Recall:  Good  Fund of Knowledge:  Good  Language:  Good  Akathisia:  No  Handed:  Right  AIMS (if indicated):     Assets:  Desire for Improvement Financial Resources/Insurance Housing Social Support  ADL's:  Intact  Cognition:  WNL  Sleep:  Number of Hours: 5.75   Treatment Plan Summary: Daily contact with patient to assess and evaluate symptoms and progress in treatment, Medication management and Plan is to:    Will continue today 05/21/17 plan as below except where it is noted.  -Continue Zoloft 50 mg PO Daily for depression -Continue Trazodone 50 mg PO QHS PRN for insomnia -Continue K-Dur for hypokalemia - titrate as necessary -Continue Abilify 10 mg PO Daily for mood control. -Continue Vistaril 25 mg PO TID PRN for anxiety -Encourage group therapy participation. -SW to continue to work on the discharge disposition.  Encarnacion Slates, NP, PMHNP, FNP-BC 05/21/2017, 4:12 PMPatient ID: Diamond Rice, female   DOB: October 25, 1970, 46 y.o.  MRN: [id number]

## 2017-05-21 NOTE — Progress Notes (Signed)
D:  Patient's self inventory sheet, patient has fair sleep, sleep medication helpful.  Fair appetite, low energy level, poor concentration.  Rated depression, anxiety, hopeless #10.  Denied withdrawals.  SI, contracts for safety.  Denied physical problems.  Physical pain, worst pain #9, legs.  Goal is to get better.  Plans to attend groups.  No discharge plans. A:  Medications administered per MD orders.  Emotional support and encouragement given patient. R:  Patient stated she does have SI thoughts off/on, contracts for safety, no plan.  Denied HI.  Denied visual hallucinations.  Stated she does hear voices telling her "I'm crazy." Patient has been ambulating with walker.   Patient resting in bed this morning.

## 2017-05-21 NOTE — Plan of Care (Signed)
Problem: Activity: Goal: Interest or engagement in leisure activities will improve Outcome: Progressing Pt has attended AA groups on the unit this weekend

## 2017-05-21 NOTE — Plan of Care (Signed)
Problem: Self-Concept: Goal: Ability to identify factors that promote anxiety will improve Outcome: Progressing Nurse discussed anxiety/depression/coping skills with patient.

## 2017-05-21 NOTE — Plan of Care (Signed)
Problem: Medication: Goal: Compliance with prescribed medication regimen will improve Outcome: Progressing Pt is compliant with medication regimen

## 2017-05-22 DIAGNOSIS — M549 Dorsalgia, unspecified: Secondary | ICD-10-CM

## 2017-05-22 DIAGNOSIS — E119 Type 2 diabetes mellitus without complications: Secondary | ICD-10-CM

## 2017-05-22 DIAGNOSIS — F1994 Other psychoactive substance use, unspecified with psychoactive substance-induced mood disorder: Secondary | ICD-10-CM

## 2017-05-22 DIAGNOSIS — E785 Hyperlipidemia, unspecified: Secondary | ICD-10-CM

## 2017-05-22 LAB — GLUCOSE, CAPILLARY
GLUCOSE-CAPILLARY: 149 mg/dL — AB (ref 65–99)
GLUCOSE-CAPILLARY: 149 mg/dL — AB (ref 65–99)
Glucose-Capillary: 167 mg/dL — ABNORMAL HIGH (ref 65–99)
Glucose-Capillary: 154 mg/dL — ABNORMAL HIGH (ref 65–99)

## 2017-05-22 MED ORDER — MIRTAZAPINE 15 MG PO TABS
15.0000 mg | ORAL_TABLET | Freq: Every day | ORAL | Status: DC
  Administered 2017-05-22 – 2017-05-25 (×4): 15 mg via ORAL
  Filled 2017-05-22 (×6): qty 1

## 2017-05-22 MED ORDER — ARIPIPRAZOLE 15 MG PO TABS
15.0000 mg | ORAL_TABLET | Freq: Every day | ORAL | Status: DC
  Administered 2017-05-23 – 2017-05-26 (×4): 15 mg via ORAL
  Filled 2017-05-22 (×5): qty 1

## 2017-05-22 MED ORDER — ARIPIPRAZOLE ER 400 MG IM SRER
300.0000 mg | INTRAMUSCULAR | Status: DC
  Filled 2017-05-22: qty 400

## 2017-05-22 MED ORDER — SERTRALINE HCL 100 MG PO TABS
100.0000 mg | ORAL_TABLET | Freq: Every day | ORAL | Status: DC
  Administered 2017-05-23 – 2017-05-26 (×4): 100 mg via ORAL
  Filled 2017-05-22 (×6): qty 1

## 2017-05-22 NOTE — Progress Notes (Signed)
Recreation Therapy Notes  Animal-Assisted Activity (AAA) Program Checklist/Progress Notes Patient Eligibility Criteria Checklist & Daily Group note for Rec TxIntervention  Date: 09.26.2018 Time: 2:45pm Location: [address]   AAA/T Program Assumption of Risk Form signed by Patient/ or Parent Legal Guardian Yes  Patient is free of allergies or sever asthma Yes  Patient reports no fear of animals Yes  Patient reports no history of cruelty to animals Yes  Patient understands his/her participation is voluntary Yes  Behavioral Response: Did not attend.   Laureen Ochs Latanya Hemmer, LRT/CTRS          Endy Easterly L 05/22/2017 3:00 PM

## 2017-05-22 NOTE — Progress Notes (Signed)
D: Per patient's self inventory sheet, patient has had fair sleep, sleep medication helpful. Reports fair appetite, low energy level, poor concentration.  Rated depression, anxiety, and hopelessness "10" (0-10).  Denied withdrawals.  SI, contracts for safety.  Denied physical problems. Reports goal of "getting better" and plans to do so by "taking med's and attending group".  A: Medications administered per MD orders.  Emotional support and encouragement offered. R:  Patient stated she does have intermittent thoughts of harming self.  Contracts for safety, no plan.  Patient has been observed using walker on the unit, making phone calls, and interacting appropriately with staff and others. 15 minute checks maintained for patient safety.

## 2017-05-22 NOTE — Progress Notes (Signed)
Adult Psychoeducational Group Note  Date:  05/22/2017 Time:  3:07 AM  Group Topic/Focus:  Wrap-Up Group:   The focus of this group is to help patients review their daily goal of treatment and discuss progress on daily workbooks.  Participation Level:  Active  Participation Quality:  Appropriate  Affect:  Appropriate  Cognitive:  Appropriate  Insight: Appropriate  Engagement in Group:  Engaged  Modes of Intervention:  Discussion  Additional Comments:  Pt attended the [place] group this even and was appropriate the entire session.  Candy Sledge 05/22/2017, 3:07 AM

## 2017-05-22 NOTE — Progress Notes (Signed)
D: Patient denies SI, HI or AVH tonight. Patient has a depressed mood and flat affect but is otherwise pleasant.  Pt. States, "I feel the same" when discussing her medication regimen and symptoms.  Pt. States, "it doesn't feel like the meds are working and I've been taking them a long time".  Pt. States that she has been having to take the two doses of trazodone at night because of sleep difficulty.  She states that she has previously been on Remeron for sleep which helped her.  Pt. Reports her anxiety really starts when she goes to lay down to sleep.  Pt. Denied any other physical complaints.    A: Patient given emotional support from RN. Patient encouraged to come to staff with concerns and/or questions. Patient's medication routine continued. Patient's orders and plan of care reviewed.   R: Patient remains appropriate and cooperative. Will continue to monitor patient q15 minutes for safety.

## 2017-05-22 NOTE — Progress Notes (Signed)
Patient told nurse that she does not have SI and HI today.  Denied visual hallucinations.  Stated voices continue to tell her to hurt herself.  Patient contracts for safety.  Patient has stayed in bed most of the day.

## 2017-05-22 NOTE — [facility] Group Notes (Signed)
LCSW Group Therapy Note  05/22/2017 1:15pm  Type of Therapy/Topic:  Group Therapy:  Feelings about Diagnosis  Participation Level: Active  Description of Group:   This group will allow patients to explore their thoughts and feelings about diagnoses they have received. Patients will be guided to explore their level of understanding and acceptance of these diagnoses. Facilitator will encourage patients to process their thoughts and feelings about the reactions of others to their diagnosis and will guide patients in identifying ways to discuss their diagnosis with significant others in their lives. This group will be process-oriented, with patients participating in exploration of their own experiences, giving and receiving support, and processing challenge from other group members.   Therapeutic Goals: 1. Patient will demonstrate understanding of diagnosis as evidenced by identifying two or more symptoms of the disorder 2. Patient will be able to express two feelings regarding the diagnosis 3. Patient will demonstrate their ability to communicate their needs through discussion and/or role play  Summary of Patient Progress: Pt was present for the duration of the group. Pt presented with a flat and depressed affect. Pt was quiet for most of the group, but then began to engage more when prompted. Pt states that she has been struggling with substance use because she uses it as a way to cope with her past trauma experiences. Pt states that she is ready for change and she is looking forward to getting the treatment she needs so she can stop using.    Therapeutic Modalities:   Cognitive Behavioral Therapy Brief Therapy Feelings Identification    Georga Kaufmann, MSW, [facility] 05/22/2017 3:42 PM

## 2017-05-22 NOTE — Plan of Care (Signed)
Problem: Education: Goal: Ability to state activities that reduce stress will improve Outcome: Progressing Nurse discussed anxiety/depression/coping skills with patient.    

## 2017-05-22 NOTE — Progress Notes (Signed)
D: Patient denies SI or HI but continues to endorse AH. Patient has a pleasant but depressed mood with flat affect.  She states that her day was "a little bit better".  States that she did attend some groups today but anxiety can be disruptive for her.  Pt. Has been visible on the unit tonight interacting with staff and others.  She denies any physical complaints.  A: Patient given emotional support from RN. Patient encouraged to come to staff with concerns and/or questions. Patient's medication routine continued. Patient's orders and plan of care reviewed.   R: Patient remains appropriate and cooperative. Will continue to monitor patient q15 minutes for safety.

## 2017-05-22 NOTE — [facility] Group Notes (Signed)
The focus of this group is to educate the patient on the purpose and policies of crisis stabilization and provide a format to answer questions about their admission.  The group details unit policies and expectations of patients while admitted.  Patient did not attend 0900 nurse education orientation group this morning.  Patient stayed in room.  

## 2017-05-22 NOTE — Progress Notes (Signed)
[facility] MD Progress Note  05/22/2017 4:44 PM Diamond Rice  MRN:  [id number] Subjective:   46 y.o AAF, single, no kids,  resides at a rooming house. Background history of Schizoaffective Disorder, SUD and multiple medical issues. Was referred from [facility] on account of medical instability. Presented to them with worsening suicidal thoughts. Has been getting command auditory hallucinations to overdose on her medications. Significant history of suicidal attempts in the past. Intoxicated with cocaine and THC at presentation.   Chart reviewed today. Patient discussed at team today.  Staff reports that she is blunted and slow to react. Participated at some of the unit groups.  Seen today. Says she gets depressed a lot. Would give up once feeling down. Says she had not been taking her medications as prescribed in the community. Would hear voices telling her that there is no point going on like this. Says she had been struggling without enough resources. She was just recently approved for SSI. Says she is pleased about that. Continues to report low mood. Says the voices are quieter since she was started on Abilify. Suicidal thoughts are less. No thoughts of violence towards others. Denies any cravings for substances at this time. We discussed effects of cocaine on her physical health and mental health. Patient is aware it can increase her blood pressure and precipitate strokes or hear attacks. Minimizes her use. Encouraged to quit.   Principal Problem: Schizoaffective disorder ([place]) Diagnosis:   Patient Active Problem List   Diagnosis Date Noted  . Cannabis use disorder, moderate, dependence ([place]) [F12.20] 05/19/2017  . Cocaine use disorder, moderate, dependence ([place]) [F14.20] 05/19/2017  . Schizoaffective disorder ([place]) [F25.9] 10/07/2015  . OSA (obstructive sleep apnea) [G47.33] 06/09/2015  . Vitamin D deficiency [E55.9] 01/06/2015  . Bilateral knee pain [M25.561, M25.562] 01/05/2015  . Numbness  and tingling in right hand [R20.0, R20.2] 01/05/2015  . Insomnia [G47.00] 12/07/2014  . Right-sided lacunar infarction ([place]) [I63.9] 09/15/2014  . Left hemiparesis ([place]) [G81.94] 09/15/2014  . Dyspnea [R06.00]   . Back pain [M54.9] 09/11/2013  . Psychoactive substance-induced organic mood disorder ([place]) [X90.24, F06.30] 05/28/2013  . Cocaine abuse with cocaine-induced mood disorder ([place]) [F14.14] 05/28/2013  . Cannabis dependence with cannabis-induced anxiety disorder ([place]) [F12.280] 05/28/2013  . Morbid obesity with BMI of 45.0-49.9, adult ([place]) [E66.01, Z68.42] 04/25/2012  . Chest pain [R07.9] 04/17/2012  . Hypertension [I10] 04/17/2012  . Diabetes mellitus type 2 with complications, uncontrolled ([place]) [E11.8, E11.65] 04/17/2012  . Hyperlipidemia [E78.5] 04/17/2012  . Tobacco use [Z72.0] 04/17/2012   Total Time spent with patient: 20 minutes  Past Psychiatric History: As in H&P  Past Medical History:  Past Medical History:  Diagnosis Date  . Anginal pain ([place])   . Anxiety   . Chronic lower back pain 04/17/2012   "just got over some; catched when I walked"  . COPD (chronic obstructive pulmonary disease) ([place])   . Depression   . Headache(784.0) 04/17/2012   "~ qod; lately waking up in am w/one"  . Heart attack ([place])   . High cholesterol   . Hypertension   . Migraines 04/17/2012  . Obesity   . Shortness of breath 04/17/2012   "all the time"  . Sleep apnea   . Stroke ([place])   . Type II diabetes mellitus ([place]) 08/28/2002    Past Surgical History:  Procedure Laterality Date  . CARDIAC CATHETERIZATION  ~ 2011   Family History:  Family History  Problem Relation Age of Onset  . Stroke Brother  62  . Stroke Brother 29  . Heart attack Mother 29  . Stroke Father 19   Family Psychiatric  History: As in H&P Social History:  History  Alcohol Use No    Comment: rare     History  Drug Use  . Types: Marijuana, "Crack" cocaine    Social History   Social History  . Marital  status: Single    Spouse name: N/A  . Number of children: N/A  . Years of education: N/A   Social History Main Topics  . Smoking status: Current Some Day Smoker    Packs/day: 0.00    Years: 10.00    Types: Cigarettes    Last attempt to quit: 12/07/2014  . Smokeless tobacco: Never Used     Comment: smoke a cigarette a day   . Alcohol use No     Comment: rare  . Drug use: Yes    Types: Marijuana, "Crack" cocaine  . Sexual activity: Not Currently   Other Topics Concern  . 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   Additional Social History:          Sleep: Poor on Trazodone. Says she had responded well to Mirtazapine in the past.  Appetite:  Fair  Current Medications: Current Facility-Administered Medications  Medication Dose Route Frequency Provider Last Rate Last Dose  . alum & mag hydroxide-simeth (MAALOX/MYLANTA) 200-200-20 MG/5ML suspension 30 mL  30 mL Oral Q4H PRN Rankin, Shuvon B, NP      . amLODipine (NORVASC) tablet 10 mg  10 mg Oral Daily Money, Lowry Ram, FNP   10 mg at 05/22/17 0816  . ARIPiprazole (ABILIFY) tablet 10 mg  10 mg Oral Daily Money, Lowry Ram, FNP   10 mg at 05/22/17 0816  . ferrous sulfate tablet 325 mg  325 mg Oral BID WC Ursula Alert, MD   325 mg at 05/22/17 0816  . fluticasone (FLONASE) 50 MCG/ACT nasal spray 1 spray  1 spray Each Nare BID Money, Lowry Ram, FNP   1 spray at 05/22/17 0816  . guaiFENesin (MUCINEX) 12 hr tablet 1,200 mg  1,200 mg Oral BID PRN Lindon Romp A, NP   1,200 mg at 05/22/17 0973  . hydrOXYzine (ATARAX/VISTARIL) tablet 25 mg  25 mg Oral TID PRN Rozetta Nunnery, NP   25 mg at 05/21/17 2203  . ibuprofen (ADVIL,MOTRIN) tablet 600 mg  600 mg Oral TID PRN Lu Duffel, Justina A, NP   600 mg at 05/20/17 2132  . insulin aspart (novoLOG) injection 0-20 Units  0-20 Units Subcutaneous TID WC Ursula Alert, MD   4 Units at 05/22/17 1245  . insulin aspart (novoLOG) injection 0-5 Units   0-5 Units Subcutaneous QHS Ursula Alert, MD   3 Units at 05/21/17 2200  . insulin glargine (LANTUS) injection 13 Units  13 Units Subcutaneous Daily Rankin, Shuvon B, NP   13 Units at 05/22/17 0815  . lisinopril (PRINIVIL,ZESTRIL) tablet 20 mg  20 mg Oral Daily Money, Lowry Ram, FNP   20 mg at 05/22/17 0816  . magnesium hydroxide (MILK OF MAGNESIA) suspension 30 mL  30 mL Oral Daily PRN Rankin, Shuvon B, NP   30 mL at 05/21/17 0819  . menthol-cetylpyridinium (CEPACOL) lozenge 3 mg  1 lozenge Oral PRN Lindon Romp A, NP   3 mg at 05/22/17 0823  . potassium chloride SA (K-DUR,KLOR-CON) CR tablet 20 mEq  20 mEq Oral Daily  Money, Lowry Ram, FNP   20 mEq at 05/22/17 0816  . sertraline (ZOLOFT) tablet 50 mg  50 mg Oral Daily Money, Lowry Ram, FNP   50 mg at 05/22/17 0816  . traZODone (DESYREL) tablet 50 mg  50 mg Oral QHS,MR X 1 Lindon Romp A, NP   50 mg at 05/21/17 2201    Lab Results:  Results for orders placed or performed during the hospital encounter of 05/18/17 (from the past 48 hour(s))  Glucose, capillary     Status: Abnormal   Collection Time: 05/20/17  5:10 PM  Result Value Ref Range   Glucose-Capillary 236 (H) 65 - 99 mg/dL  Glucose, capillary     Status: Abnormal   Collection Time: 05/20/17  9:03 PM  Result Value Ref Range   Glucose-Capillary 175 (H) 65 - 99 mg/dL   Comment 1 Notify RN   Glucose, capillary     Status: Abnormal   Collection Time: 05/21/17  5:55 AM  Result Value Ref Range   Glucose-Capillary 171 (H) 65 - 99 mg/dL   Comment 1 Notify RN   Glucose, capillary     Status: Abnormal   Collection Time: 05/21/17 12:04 PM  Result Value Ref Range   Glucose-Capillary 217 (H) 65 - 99 mg/dL   Comment 1 Notify RN    Comment 2 Document in Chart   Glucose, capillary     Status: Abnormal   Collection Time: 05/21/17  5:03 PM  Result Value Ref Range   Glucose-Capillary 191 (H) 65 - 99 mg/dL   Comment 1 Notify RN    Comment 2 Document in Chart   Glucose, capillary     Status:  Abnormal   Collection Time: 05/21/17  9:04 PM  Result Value Ref Range   Glucose-Capillary 266 (H) 65 - 99 mg/dL  Glucose, capillary     Status: Abnormal   Collection Time: 05/22/17  6:36 AM  Result Value Ref Range   Glucose-Capillary 149 (H) 65 - 99 mg/dL   Comment 1 Notify RN    Comment 2 Document in Chart   Glucose, capillary     Status: Abnormal   Collection Time: 05/22/17 12:37 PM  Result Value Ref Range   Glucose-Capillary 167 (H) 65 - 99 mg/dL    Blood Alcohol level:  Lab Results  Component Value Date   ETH <5 05/17/2017   ETH <5 40/98/1191    Metabolic Disorder Labs: Lab Results  Component Value Date   HGBA1C 12.3 (H) 10/09/2015   MPG 306 10/09/2015   MPG 249 (H) 08/28/2014   No results found for: PROLACTIN Lab Results  Component Value Date   CHOL 196 08/28/2014   TRIG 129 08/28/2014   HDL 45 08/28/2014   CHOLHDL 4.4 08/28/2014   VLDL 26 08/28/2014   LDLCALC 125 (H) 08/28/2014   LDLCALC 107 (H) 09/04/2013    Physical Findings: AIMS: Facial and Oral Movements Muscles of Facial Expression: None, normal Lips and Perioral Area: None, normal Jaw: None, normal Tongue: None, normal,Extremity Movements Upper (arms, wrists, hands, fingers): None, normal Lower (legs, knees, ankles, toes): None, normal, Trunk Movements Neck, shoulders, hips: None, normal, Overall Severity Severity of abnormal movements (highest score from questions above): None, normal Incapacitation due to abnormal movements: None, normal Patient's awareness of abnormal movements (rate only patient's report): No Awareness, Dental Status Current problems with teeth and/or dentures?: No Does patient usually wear dentures?: No  CIWA:  CIWA-Ar Total: 1 COWS:  COWS Total Score: 1  Musculoskeletal:  Strength & Muscle Tone: decreased Gait & Station: mobilizes with a walker Patient leans: as above  Psychiatric Specialty Exam: Physical Exam  Constitutional: She is oriented to person, place, and  time. No distress.  HENT:  Head: Normocephalic and atraumatic.  Respiratory: Effort normal.  Neurological: She is alert and oriented to person, place, and time.  Skin: She is not diaphoretic.  Psychiatric:  As above    ROS  Blood pressure (!) 151/95, pulse 80, temperature 98.9 F (37.2 C), temperature source Oral, resp. rate 18, height 5\' 1"  (1.549 m), weight 103 kg (227 lb), last menstrual period 05/04/2017, SpO2 99 %.Body mass index is 42.89 kg/m.  General Appearance: Overweight, in hospital clothing. Was playing cards with peers before interview. Moderate rapport. Slightly withdrawn.  Eye Contact:  Fair  Speech:  Soft spoken  Volume:  Decreased  Mood:  Depressed  Affect:  Blunted  Thought Process:  Linear. Slow speed of thought.   Orientation:  Full (Time, Place, and Person)  Thought Content:  Negative ruminations about self. No thoughts of violence.   Suicidal Thoughts:  Less intense  Homicidal Thoughts:  No  Memory:  Immediate;   Good Recent;   Good Remote;   Good  Judgement:  Good  Insight:  Partial  Psychomotor Activity:  Decreased  Concentration:  Concentration: Fair and Attention Span: Fair  Recall:  Good  Fund of Knowledge:  Good  Language:  Good  Akathisia:  Negative  Handed:    AIMS (if indicated):     Assets:  Communication Skills Desire for Improvement Financial Resources/Insurance Resilience  ADL's:  Limited  Cognition:  WNL  Sleep:  Number of Hours: 6.5     Treatment Plan Summary: Patient is still depressed. Suicidal thoughts are less intense. Auditory hallucinations are less intense. She is gradually responding to her current regimen. We explored long acting formulary of Abilify. Patient consented to it after we explored the risks and benefits. We also agreed to optimize her antidepressant.   Psychiatric: Schizoaffective disorder SUD  Medical: HTN DM CVD HLD LBP  Psychosocial:  Multiple medical comorbidity Limited support  PLAN: 1.  Increase Abilify to 15 mg daily 2. Abilify Maintenna 300 mg monthly 3. Increase Sertraline to 100 mg daily 4. Switch Trazodone to low dose Mirtazapine for sleep 5. Encourage unit groups and activities 6. Continue to monitor mood, behavior and interaction with peers   [place], MD 05/22/2017, 4:44 PM

## 2017-05-23 LAB — GLUCOSE, CAPILLARY
GLUCOSE-CAPILLARY: 209 mg/dL — AB (ref 65–99)
Glucose-Capillary: 135 mg/dL — ABNORMAL HIGH (ref 65–99)
Glucose-Capillary: 150 mg/dL — ABNORMAL HIGH (ref 65–99)
Glucose-Capillary: 174 mg/dL — ABNORMAL HIGH (ref 65–99)

## 2017-05-23 MED ORDER — PANTOPRAZOLE SODIUM 40 MG PO TBEC
40.0000 mg | DELAYED_RELEASE_TABLET | Freq: Every day | ORAL | Status: DC
  Administered 2017-05-23 – 2017-05-26 (×4): 40 mg via ORAL
  Filled 2017-05-23 (×5): qty 1

## 2017-05-23 MED ORDER — LISINOPRIL 20 MG PO TABS
20.0000 mg | ORAL_TABLET | Freq: Once | ORAL | Status: AC
  Administered 2017-05-23: 20 mg via ORAL
  Filled 2017-05-23: qty 1

## 2017-05-23 MED ORDER — ARIPIPRAZOLE ER 300 MG IM SRER
300.0000 mg | INTRAMUSCULAR | Status: DC
  Administered 2017-05-24: 300 mg via INTRAMUSCULAR
  Filled 2017-05-23: qty 1

## 2017-05-23 MED ORDER — LISINOPRIL 40 MG PO TABS
40.0000 mg | ORAL_TABLET | Freq: Every day | ORAL | Status: DC
  Filled 2017-05-23: qty 1

## 2017-05-23 MED ORDER — ALBUTEROL SULFATE HFA 108 (90 BASE) MCG/ACT IN AERS
2.0000 | INHALATION_SPRAY | RESPIRATORY_TRACT | Status: DC | PRN
  Administered 2017-05-23 – 2017-05-24 (×2): 2 via RESPIRATORY_TRACT

## 2017-05-23 NOTE — Progress Notes (Signed)
Patient ID: Diamond Rice, female   DOB: [birth year], 46 y.o.   MRN: [id number]  DAR: Pt. Denies HI and visual Hallucinations. She verbalizes SI and auditory hallucinations that are command in nature. She does not appear to be responding to internal stimuli at this time. She is able to contract for safety. She reports her sleep last night was good, appetite is fair, energy level is low, and concentration is poor. She rates depression 9/10, hopelessness 10/10, and anxiety 10/10. She does report some lightheadedness, which may be in relation to her elevated BP. MD Izediuno was notified of patient's elevated BP. Patient does not report any pain at this time. Support and encouragement provided to the patient. Scheduled medications administered to patient per physician's orders. Patient is cooperative at this time. She is seen in the milieu intermittently but does retreat to her bed at times. She is flat in affect and depressed in mood. She continues to utilize her walker to aide in ambulation. Q15 minute checks are maintained for safety.

## 2017-05-23 NOTE — [facility] Group Notes (Signed)
LCSW Group Therapy Note  05/23/2017 1:15pm  Type of Therapy and Topic:  Group Therapy:  Feelings around Relapse and Recovery  Participation Level:  Did Not Attend --pt meeting with MD during group. EXCUSED.  Description of Group:    Patients in this group will discuss emotions they experience before and after a relapse. They will process how experiencing these feelings, or avoidance of experiencing them, relates to having a relapse. Facilitator will guide patients to explore emotions they have related to recovery. Patients will be encouraged to process which emotions are more powerful. They will be guided to discuss the emotional reaction significant others in their lives may have to their relapse or recovery. Patients will be assisted in exploring ways to respond to the emotions of others without this contributing to a relapse.  Therapeutic Goals: 1. Patient will identify two or more emotions that lead to a relapse for them 2. Patient will identify two emotions that result when they relapse 3. Patient will identify two emotions related to recovery 4. Patient will demonstrate ability to communicate their needs through discussion and/or role plays   Summary of Patient Progress:     Therapeutic Modalities:   Cognitive Behavioral Therapy Solution-Focused Therapy Assertiveness Training Relapse Prevention Therapy   Kimber Relic Smart, LCSW 05/23/2017 4:02 PM

## 2017-05-23 NOTE — Progress Notes (Signed)
[facility] MD Progress Note  05/23/2017 1:34 PM Taneshia Lorence  MRN:  [id number] Subjective:   46 y.o AAF, single, no kids,  resides at a rooming house. Background history of Schizoaffective Disorder, SUD and multiple medical issues. Was referred from [facility] on account of medical instability. Presented to them with worsening suicidal thoughts. Has been getting command auditory hallucinations to overdose on her medications. Significant history of suicidal attempts in the past. Intoxicated with cocaine and THC at presentation.   Chart reviewed today. Patient discussed at team today.  Staff reports that her blood pressure is still up. She had been on higher dose of Lisinopril in the past. She is less withdrawn. She has not been observed to be internally distracted. Physically focused. Wants PRN medications of bronchospasm. Wants medication for GERD.   Seen today. Tolerating recent dose adjustment well. Says the voices are still there. Not as intense as before. They tell her negative things about herself. No command to hurt self. No command to hurt others. Says she is able to cope with the voices. She is able to distract herself. She has been attending more groups. No loss of will. No feeling of being taken over by an external force. She would get her depot tomorrow.   Principal Problem: Schizoaffective disorder ([place]) Diagnosis:   Patient Active Problem List   Diagnosis Date Noted  . Cannabis use disorder, moderate, dependence ([place]) [F12.20] 05/19/2017  . Cocaine use disorder, moderate, dependence ([place]) [F14.20] 05/19/2017  . Schizoaffective disorder ([place]) [F25.9] 10/07/2015  . OSA (obstructive sleep apnea) [G47.33] 06/09/2015  . Vitamin D deficiency [E55.9] 01/06/2015  . Bilateral knee pain [M25.561, M25.562] 01/05/2015  . Numbness and tingling in right hand [R20.0, R20.2] 01/05/2015  . Insomnia [G47.00] 12/07/2014  . Right-sided lacunar infarction ([place]) [I63.9] 09/15/2014  . Left hemiparesis  ([place]) [G81.94] 09/15/2014  . Dyspnea [R06.00]   . Back pain [M54.9] 09/11/2013  . Psychoactive substance-induced organic mood disorder ([place]) [Q25.95, F06.30] 05/28/2013  . Cocaine abuse with cocaine-induced mood disorder ([place]) [F14.14] 05/28/2013  . Cannabis dependence with cannabis-induced anxiety disorder ([place]) [F12.280] 05/28/2013  . Morbid obesity with BMI of 45.0-49.9, adult ([place]) [E66.01, Z68.42] 04/25/2012  . Chest pain [R07.9] 04/17/2012  . Hypertension [I10] 04/17/2012  . Diabetes mellitus type 2 with complications, uncontrolled ([place]) [E11.8, E11.65] 04/17/2012  . Hyperlipidemia [E78.5] 04/17/2012  . Tobacco use [Z72.0] 04/17/2012   Total Time spent with patient: 20 minutes  Past Psychiatric History: As in H&P  Past Medical History:  Past Medical History:  Diagnosis Date  . Anginal pain ([place])   . Anxiety   . Chronic lower back pain 04/17/2012   "just got over some; catched when I walked"  . COPD (chronic obstructive pulmonary disease) ([place])   . Depression   . Headache(784.0) 04/17/2012   "~ qod; lately waking up in am w/one"  . Heart attack ([place])   . High cholesterol   . Hypertension   . Migraines 04/17/2012  . Obesity   . Shortness of breath 04/17/2012   "all the time"  . Sleep apnea   . Stroke ([place])   . Type II diabetes mellitus ([place]) 08/28/2002    Past Surgical History:  Procedure Laterality Date  . CARDIAC CATHETERIZATION  ~ 2011   Family History:  Family History  Problem Relation Age of Onset  . Stroke Brother 35  . Stroke Brother 74  . Heart attack Mother 63  . Stroke Father 106   Family Psychiatric  History: As in  H&P Social History:  History  Alcohol Use No    Comment: rare     History  Drug Use  . Types: Marijuana, "Crack" cocaine    Social History   Social History  . Marital status: Single    Spouse name: N/A  . Number of children: N/A  . Years of education: N/A   Social History Main Topics  . Smoking status: Current Some Day Smoker     Packs/day: 0.00    Years: 10.00    Types: Cigarettes    Last attempt to quit: 12/07/2014  . Smokeless tobacco: Never Used     Comment: smoke a cigarette a day   . Alcohol use No     Comment: rare  . Drug use: Yes    Types: Marijuana, "Crack" cocaine  . Sexual activity: Not Currently   Other Topics Concern  . 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   Additional Social History:          Sleep: Poor on Trazodone. Says she had responded well to Mirtazapine in the past.  Appetite:  Fair  Current Medications: Current Facility-Administered Medications  Medication Dose Route Frequency Provider Last Rate Last Dose  . albuterol (PROVENTIL HFA;VENTOLIN HFA) 108 (90 Base) MCG/ACT inhaler 2 puff  2 puff Inhalation Q4H PRN Genifer Lazenby, Laruth Bouchard, MD      . alum & mag hydroxide-simeth (MAALOX/MYLANTA) 200-200-20 MG/5ML suspension 30 mL  30 mL Oral Q4H PRN Rankin, Shuvon B, NP   30 mL at 05/23/17 0837  . amLODipine (NORVASC) tablet 10 mg  10 mg Oral Daily Money, Lowry Ram, FNP   10 mg at 05/23/17 0834  . ARIPiprazole (ABILIFY) tablet 15 mg  15 mg Oral Daily Maryagnes Carrasco, Laruth Bouchard, MD   15 mg at 05/23/17 0835  . ARIPiprazole ER SRER 300 mg  300 mg Intramuscular Q28 days Safiyya Stokes, Laruth Bouchard, MD      . Derrill Memo ON 05/24/2017] ARIPiprazole ER SRER 300 mg  300 mg Intramuscular Q28 days Cobos, Fernando A, MD      . ferrous sulfate tablet 325 mg  325 mg Oral BID WC Ursula Alert, MD   325 mg at 05/23/17 0835  . fluticasone (FLONASE) 50 MCG/ACT nasal spray 1 spray  1 spray Each Nare BID Money, Lowry Ram, FNP   1 spray at 05/23/17 0835  . guaiFENesin (MUCINEX) 12 hr tablet 1,200 mg  1,200 mg Oral BID PRN Lindon Romp A, NP   1,200 mg at 05/23/17 0839  . hydrOXYzine (ATARAX/VISTARIL) tablet 25 mg  25 mg Oral TID PRN Rozetta Nunnery, NP   25 mg at 05/22/17 1910  . ibuprofen (ADVIL,MOTRIN) tablet 600 mg  600 mg Oral TID PRN Hughie Closs A, NP   600 mg at  05/20/17 2132  . insulin aspart (novoLOG) injection 0-20 Units  0-20 Units Subcutaneous TID WC Ursula Alert, MD   3 Units at 05/23/17 1222  . insulin aspart (novoLOG) injection 0-5 Units  0-5 Units Subcutaneous QHS Ursula Alert, MD   3 Units at 05/21/17 2200  . insulin glargine (LANTUS) injection 13 Units  13 Units Subcutaneous Daily Rankin, Shuvon B, NP   13 Units at 05/23/17 0835  . lisinopril (PRINIVIL,ZESTRIL) tablet 20 mg  20 mg Oral Daily Money, Lowry Ram, FNP   20 mg at 05/23/17 0834  . magnesium hydroxide (MILK OF MAGNESIA) suspension 30 mL  30 mL Oral Daily  PRN Rankin, Shuvon B, NP   30 mL at 05/21/17 0819  . menthol-cetylpyridinium (CEPACOL) lozenge 3 mg  1 lozenge Oral PRN Lindon Romp A, NP   3 mg at 05/23/17 1610  . mirtazapine (REMERON) tablet 15 mg  15 mg Oral QHS Ketara Cavness, Laruth Bouchard, MD   15 mg at 05/22/17 2259  . pantoprazole (PROTONIX) EC tablet 40 mg  40 mg Oral Daily Pairlee Sawtell, Laruth Bouchard, MD   40 mg at 05/23/17 1222  . potassium chloride SA (K-DUR,KLOR-CON) CR tablet 20 mEq  20 mEq Oral Daily Money, Lowry Ram, FNP   20 mEq at 05/23/17 0835  . sertraline (ZOLOFT) tablet 100 mg  100 mg Oral Daily Kordel Leavy, Laruth Bouchard, MD   100 mg at 05/23/17 0836  . traZODone (DESYREL) tablet 50 mg  50 mg Oral QHS,MR X 1 Lindon Romp A, NP   50 mg at 05/21/17 2201    Lab Results:  Results for orders placed or performed during the hospital encounter of 05/18/17 (from the past 48 hour(s))  Glucose, capillary     Status: Abnormal   Collection Time: 05/21/17  5:03 PM  Result Value Ref Range   Glucose-Capillary 191 (H) 65 - 99 mg/dL   Comment 1 Notify RN    Comment 2 Document in Chart   Glucose, capillary     Status: Abnormal   Collection Time: 05/21/17  9:04 PM  Result Value Ref Range   Glucose-Capillary 266 (H) 65 - 99 mg/dL  Glucose, capillary     Status: Abnormal   Collection Time: 05/22/17  6:36 AM  Result Value Ref Range   Glucose-Capillary 149 (H) 65 - 99 mg/dL   Comment 1 Notify  RN    Comment 2 Document in Chart   Glucose, capillary     Status: Abnormal   Collection Time: 05/22/17 12:37 PM  Result Value Ref Range   Glucose-Capillary 167 (H) 65 - 99 mg/dL  Glucose, capillary     Status: Abnormal   Collection Time: 05/22/17  5:01 PM  Result Value Ref Range   Glucose-Capillary 149 (H) 65 - 99 mg/dL  Glucose, capillary     Status: Abnormal   Collection Time: 05/22/17  9:25 PM  Result Value Ref Range   Glucose-Capillary 154 (H) 65 - 99 mg/dL   Comment 1 Notify RN    Comment 2 Document in Chart   Glucose, capillary     Status: Abnormal   Collection Time: 05/23/17  5:58 AM  Result Value Ref Range   Glucose-Capillary 135 (H) 65 - 99 mg/dL  Glucose, capillary     Status: Abnormal   Collection Time: 05/23/17 12:17 PM  Result Value Ref Range   Glucose-Capillary 150 (H) 65 - 99 mg/dL   Comment 1 Notify RN     Blood Alcohol level:  Lab Results  Component Value Date   ETH <5 05/17/2017   ETH <5 96/11/5407    Metabolic Disorder Labs: Lab Results  Component Value Date   HGBA1C 12.3 (H) 10/09/2015   MPG 306 10/09/2015   MPG 249 (H) 08/28/2014   No results found for: PROLACTIN Lab Results  Component Value Date   CHOL 196 08/28/2014   TRIG 129 08/28/2014   HDL 45 08/28/2014   CHOLHDL 4.4 08/28/2014   VLDL 26 08/28/2014   LDLCALC 125 (H) 08/28/2014   LDLCALC 107 (H) 09/04/2013    Physical Findings: AIMS: Facial and Oral Movements Muscles of Facial Expression: None, normal Lips and Perioral Area:  None, normal Jaw: None, normal Tongue: None, normal,Extremity Movements Upper (arms, wrists, hands, fingers): None, normal Lower (legs, knees, ankles, toes): None, normal, Trunk Movements Neck, shoulders, hips: None, normal, Overall Severity Severity of abnormal movements (highest score from questions above): None, normal Incapacitation due to abnormal movements: None, normal Patient's awareness of abnormal movements (rate only patient's report): No  Awareness, Dental Status Current problems with teeth and/or dentures?: No Does patient usually wear dentures?: No  CIWA:  CIWA-Ar Total: 1 COWS:  COWS Total Score: 1  Musculoskeletal: Strength & Muscle Tone: decreased Gait & Station: mobilizes with a walker Patient leans: as above  Psychiatric Specialty Exam: Physical Exam  Constitutional: She is oriented to person, place, and time. No distress.  HENT:  Head: Normocephalic and atraumatic.  Respiratory: Effort normal.  Neurological: She is alert and oriented to person, place, and time.  Skin: She is not diaphoretic.  Psychiatric:  As above    ROS  Blood pressure (!) 160/100, pulse 82, temperature 98.6 F (37 C), temperature source Oral, resp. rate 12, height 5\' 1"  (1.549 m), weight 103 kg (227 lb), last menstrual period 05/04/2017, SpO2 99 %.Body mass index is 42.89 kg/m.  General Appearance: Calm and cooperative. Better psychomotor activity. Does not appear distracted by internal stimuli.   Eye Contact:  Better today  Speech:  Soft spoken. Normal rate  Volume:  Decreased  Mood:  Mildly improved.   Affect:  Restricted mostly  Thought Process:  Goal directed. Decreased speed of thought.   Orientation:  Full (Time, Place, and Person)  Thought Content:  Negative ruminations are marginally better. No violent thoughts. Auditory hallucinations are less intense.   Suicidal Thoughts:  Less intense  Homicidal Thoughts:  No  Memory:  Immediate;   Good Recent;   Good Remote;   Good  Judgement:  Good  Insight:  Partial  Psychomotor Activity:  Decreased  Concentration:  Concentration: Fair and Attention Span: Fair  Recall:  Good  Fund of Knowledge:  Good  Language:  Good  Akathisia:  Negative  Handed:    AIMS (if indicated):     Assets:  Communication Skills Desire for Improvement Financial Resources/Insurance Resilience  ADL's:  Limited  Cognition:  WNL  Sleep:  Number of Hours: 6.5     Treatment Plan Summary: Patient  is still depressed. Suicidal thoughts are less intense. Auditory hallucinations are less intense. We adjusted her medications yesterday. We would evaluate her further.  Psychiatric: Schizoaffective disorder SUD  Medical: HTN DM CVD HLD LBP  Psychosocial:  Multiple medical comorbidity Limited support  PLAN: 1. Increase Lisinopril to 40 mg daily 2. PRN bronchodialators 3. Continue other medications at current dose 4. Continue to monitor mood, behavior and interaction with peers    Artist Beach, MD 05/23/2017, 1:34 PMPatient ID: [name], female   DOB: [birth year], 46 y.o.   MRN: [id number]

## 2017-05-24 LAB — GLUCOSE, CAPILLARY
GLUCOSE-CAPILLARY: 159 mg/dL — AB (ref 65–99)
Glucose-Capillary: 154 mg/dL — ABNORMAL HIGH (ref 65–99)
Glucose-Capillary: 213 mg/dL — ABNORMAL HIGH (ref 65–99)
Glucose-Capillary: 156 mg/dL — ABNORMAL HIGH (ref 65–99)

## 2017-05-24 MED ORDER — HYDRALAZINE HCL 10 MG PO TABS
10.0000 mg | ORAL_TABLET | Freq: Four times a day (QID) | ORAL | Status: DC | PRN
  Administered 2017-05-25 (×2): 10 mg via ORAL
  Filled 2017-05-24 (×2): qty 1

## 2017-05-24 MED ORDER — LISINOPRIL 40 MG PO TABS
40.0000 mg | ORAL_TABLET | Freq: Every day | ORAL | Status: DC
  Administered 2017-05-24 – 2017-05-26 (×3): 40 mg via ORAL
  Filled 2017-05-24: qty 1
  Filled 2017-05-24: qty 2
  Filled 2017-05-24 (×2): qty 1

## 2017-05-24 MED ORDER — METOPROLOL TARTRATE 12.5 MG HALF TABLET
12.5000 mg | ORAL_TABLET | Freq: Two times a day (BID) | ORAL | Status: DC
  Administered 2017-05-24 – 2017-05-25 (×2): 12.5 mg via ORAL
  Filled 2017-05-24 (×5): qty 1

## 2017-05-24 NOTE — Progress Notes (Signed)
[facility] MD Progress Note  05/24/2017 5:15 PM Diamond Rice  MRN:  [id number] Subjective:   46 y.o AAF, single, no kids,  resides at a rooming house. Background history of Schizoaffective Disorder, SUD and multiple medical issues. Was referred from [facility] on account of medical instability. Presented to them with worsening suicidal thoughts. Has been getting command auditory hallucinations to overdose on her medications. Significant history of suicidal attempts in the past. Intoxicated with cocaine and THC at presentation.   Chart reviewed today. Patient discussed at team today.  Staff reports that control of her her blood pressure remains a challenge . A betablocker was added yesterday. More control with triple regimen. No behavioral issues. She is socializing more. She has not voiced any futility thoughts lately.   Seen today. Had her LAI today. Says she is feeling better. She has not had any hallucinations today. Feels her spirits are lifting. Says she is sleeping well on Mirtazapine. She has not had any suicidal thoughts lately. Patient feels her medications are helping. No reported side effects. She hopes to be home this weekend.   Principal Problem: Schizoaffective disorder ([place]) Diagnosis:   Patient Active Problem List   Diagnosis Date Noted  . Cannabis use disorder, moderate, dependence ([place]) [F12.20] 05/19/2017  . Cocaine use disorder, moderate, dependence ([place]) [F14.20] 05/19/2017  . Schizoaffective disorder ([place]) [F25.9] 10/07/2015  . OSA (obstructive sleep apnea) [G47.33] 06/09/2015  . Vitamin D deficiency [E55.9] 01/06/2015  . Bilateral knee pain [M25.561, M25.562] 01/05/2015  . Numbness and tingling in right hand [R20.0, R20.2] 01/05/2015  . Insomnia [G47.00] 12/07/2014  . Right-sided lacunar infarction ([place]) [I63.9] 09/15/2014  . Left hemiparesis ([place]) [G81.94] 09/15/2014  . Dyspnea [R06.00]   . Back pain [M54.9] 09/11/2013  . Psychoactive substance-induced organic mood  disorder ([place]) [P80.99, F06.30] 05/28/2013  . Cocaine abuse with cocaine-induced mood disorder ([place]) [F14.14] 05/28/2013  . Cannabis dependence with cannabis-induced anxiety disorder ([place]) [F12.280] 05/28/2013  . Morbid obesity with BMI of 45.0-49.9, adult ([place]) [E66.01, Z68.42] 04/25/2012  . Chest pain [R07.9] 04/17/2012  . Hypertension [I10] 04/17/2012  . Diabetes mellitus type 2 with complications, uncontrolled ([place]) [E11.8, E11.65] 04/17/2012  . Hyperlipidemia [E78.5] 04/17/2012  . Tobacco use [Z72.0] 04/17/2012   Total Time spent with patient: 20 minutes  Past Psychiatric History: As in H&P  Past Medical History:  Past Medical History:  Diagnosis Date  . Anginal pain ([place])   . Anxiety   . Chronic lower back pain 04/17/2012   "just got over some; catched when I walked"  . COPD (chronic obstructive pulmonary disease) ([place])   . Depression   . Headache(784.0) 04/17/2012   "~ qod; lately waking up in am w/one"  . Heart attack ([place])   . High cholesterol   . Hypertension   . Migraines 04/17/2012  . Obesity   . Shortness of breath 04/17/2012   "all the time"  . Sleep apnea   . Stroke ([place])   . Type II diabetes mellitus ([place]) 08/28/2002    Past Surgical History:  Procedure Laterality Date  . CARDIAC CATHETERIZATION  ~ 2011   Family History:  Family History  Problem Relation Age of Onset  . Stroke Brother 6  . Stroke Brother 99  . Heart attack Mother 26  . Stroke Father 41   Family Psychiatric  History: As in H&P Social History:  History  Alcohol Use No    Comment: rare     History  Drug Use  . Types: Marijuana, "Crack"  cocaine    Social History   Social History  . Marital status: Single    Spouse name: N/A  . Number of children: N/A  . Years of education: N/A   Social History Main Topics  . Smoking status: Current Some Day Smoker    Packs/day: 0.00    Years: 10.00    Types: Cigarettes    Last attempt to quit: 12/07/2014  . Smokeless tobacco: Never Used      Comment: smoke a cigarette a day   . Alcohol use No     Comment: rare  . Drug use: Yes    Types: Marijuana, "Crack" cocaine  . Sexual activity: Not Currently   Other Topics Concern  . 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   Additional Social History:          Sleep: Good  Appetite:  Good  Current Medications: Current Facility-Administered Medications  Medication Dose Route Frequency Provider Last Rate Last Dose  . albuterol (PROVENTIL HFA;VENTOLIN HFA) 108 (90 Base) MCG/ACT inhaler 2 puff  2 puff Inhalation Q4H PRN Artist Beach, MD   2 puff at 05/24/17 0816  . alum & mag hydroxide-simeth (MAALOX/MYLANTA) 200-200-20 MG/5ML suspension 30 mL  30 mL Oral Q4H PRN Rankin, Shuvon B, NP   30 mL at 05/23/17 0837  . amLODipine (NORVASC) tablet 10 mg  10 mg Oral Daily Money, Lowry Ram, FNP   10 mg at 05/24/17 0816  . ARIPiprazole (ABILIFY) tablet 15 mg  15 mg Oral Daily Indya Oliveria, Laruth Bouchard, MD   15 mg at 05/24/17 0816  . ARIPiprazole ER SRER 300 mg  300 mg Intramuscular Q28 days Cobos, Myer Peer, MD   300 mg at 05/24/17 1007  . ferrous sulfate tablet 325 mg  325 mg Oral BID WC Ursula Alert, MD   325 mg at 05/24/17 0816  . fluticasone (FLONASE) 50 MCG/ACT nasal spray 1 spray  1 spray Each Nare BID Money, Lowry Ram, FNP   1 spray at 05/24/17 0816  . guaiFENesin (MUCINEX) 12 hr tablet 1,200 mg  1,200 mg Oral BID PRN Lindon Romp A, NP   1,200 mg at 05/23/17 2876  . hydrALAZINE (APRESOLINE) tablet 10 mg  10 mg Oral Q6H PRN Laverle Hobby, PA-C      . hydrOXYzine (ATARAX/VISTARIL) tablet 25 mg  25 mg Oral TID PRN Rozetta Nunnery, NP   25 mg at 05/23/17 2136  . ibuprofen (ADVIL,MOTRIN) tablet 600 mg  600 mg Oral TID PRN Lu Duffel, Justina A, NP   600 mg at 05/20/17 2132  . insulin aspart (novoLOG) injection 0-20 Units  0-20 Units Subcutaneous TID WC Ursula Alert, MD   4 Units at 05/24/17 1158  . insulin aspart (novoLOG)  injection 0-5 Units  0-5 Units Subcutaneous QHS Ursula Alert, MD   2 Units at 05/23/17 2134  . insulin glargine (LANTUS) injection 13 Units  13 Units Subcutaneous Daily Rankin, Shuvon B, NP   13 Units at 05/24/17 0816  . lisinopril (PRINIVIL,ZESTRIL) tablet 40 mg  40 mg Oral Daily Endy Easterly, Laruth Bouchard, MD   40 mg at 05/24/17 0945  . magnesium hydroxide (MILK OF MAGNESIA) suspension 30 mL  30 mL Oral Daily PRN Rankin, Shuvon B, NP   30 mL at 05/21/17 0819  . menthol-cetylpyridinium (CEPACOL) lozenge 3 mg  1 lozenge Oral PRN Lindon Romp A, NP   3 mg at 05/23/17 8115  .  metoprolol tartrate (LOPRESSOR) tablet 12.5 mg  12.5 mg Oral BID Laverle Hobby, PA-C   12.5 mg at 05/24/17 0817  . mirtazapine (REMERON) tablet 15 mg  15 mg Oral QHS Nayib Remer, Laruth Bouchard, MD   15 mg at 05/23/17 2136  . pantoprazole (PROTONIX) EC tablet 40 mg  40 mg Oral Daily Ambra Haverstick, Laruth Bouchard, MD   40 mg at 05/24/17 0816  . sertraline (ZOLOFT) tablet 100 mg  100 mg Oral Daily Dmarcus Decicco, Laruth Bouchard, MD   100 mg at 05/24/17 0816  . traZODone (DESYREL) tablet 50 mg  50 mg Oral QHS,MR X 1 Lindon Romp A, NP   50 mg at 05/21/17 2201    Lab Results:  Results for orders placed or performed during the hospital encounter of 05/18/17 (from the past 48 hour(s))  Glucose, capillary     Status: Abnormal   Collection Time: 05/22/17  9:25 PM  Result Value Ref Range   Glucose-Capillary 154 (H) 65 - 99 mg/dL   Comment 1 Notify RN    Comment 2 Document in Chart   Glucose, capillary     Status: Abnormal   Collection Time: 05/23/17  5:58 AM  Result Value Ref Range   Glucose-Capillary 135 (H) 65 - 99 mg/dL  Glucose, capillary     Status: Abnormal   Collection Time: 05/23/17 12:17 PM  Result Value Ref Range   Glucose-Capillary 150 (H) 65 - 99 mg/dL   Comment 1 Notify RN   Glucose, capillary     Status: Abnormal   Collection Time: 05/23/17  5:12 PM  Result Value Ref Range   Glucose-Capillary 174 (H) 65 - 99 mg/dL  Glucose, capillary      Status: Abnormal   Collection Time: 05/23/17  9:12 PM  Result Value Ref Range   Glucose-Capillary 209 (H) 65 - 99 mg/dL  Glucose, capillary     Status: Abnormal   Collection Time: 05/24/17  6:32 AM  Result Value Ref Range   Glucose-Capillary 154 (H) 65 - 99 mg/dL  Glucose, capillary     Status: Abnormal   Collection Time: 05/24/17 11:54 AM  Result Value Ref Range   Glucose-Capillary 159 (H) 65 - 99 mg/dL   Comment 1 Notify RN   Glucose, capillary     Status: Abnormal   Collection Time: 05/24/17  5:11 PM  Result Value Ref Range   Glucose-Capillary 213 (H) 65 - 99 mg/dL   Comment 1 Notify RN     Blood Alcohol level:  Lab Results  Component Value Date   ETH <5 05/17/2017   ETH <5 70/62/3762    Metabolic Disorder Labs: Lab Results  Component Value Date   HGBA1C 12.3 (H) 10/09/2015   MPG 306 10/09/2015   MPG 249 (H) 08/28/2014   No results found for: PROLACTIN Lab Results  Component Value Date   CHOL 196 08/28/2014   TRIG 129 08/28/2014   HDL 45 08/28/2014   CHOLHDL 4.4 08/28/2014   VLDL 26 08/28/2014   LDLCALC 125 (H) 08/28/2014   LDLCALC 107 (H) 09/04/2013    Physical Findings: AIMS: Facial and Oral Movements Muscles of Facial Expression: None, normal Lips and Perioral Area: None, normal Jaw: None, normal Tongue: None, normal,Extremity Movements Upper (arms, wrists, hands, fingers): None, normal Lower (legs, knees, ankles, toes): None, normal, Trunk Movements Neck, shoulders, hips: None, normal, Overall Severity Severity of abnormal movements (highest score from questions above): None, normal Incapacitation due to abnormal movements: None, normal Patient's awareness of abnormal movements (  rate only patient's report): No Awareness, Dental Status Current problems with teeth and/or dentures?: No Does patient usually wear dentures?: No  CIWA:  CIWA-Ar Total: 1 COWS:  COWS Total Score: 1  Musculoskeletal: Strength & Muscle Tone: decreased Gait & Station:  mobilizes with a walker Patient leans: as above  Psychiatric Specialty Exam: Physical Exam  Constitutional: She is oriented to person, place, and time. No distress.  HENT:  Head: Normocephalic and atraumatic.  Respiratory: Effort normal.  Neurological: She is alert and oriented to person, place, and time.  Skin: She is not diaphoretic.  Psychiatric:  As above    ROS  Blood pressure (!) 139/99, pulse 81, temperature 98.6 F (37 C), temperature source Oral, resp. rate 16, height 5\' 1"  (1.549 m), weight 103 kg (227 lb), last menstrual period 05/04/2017, SpO2 100 %.Body mass index is 42.89 kg/m.  General Appearance: Normal psychomotor activity, good relatedness. Appropriate behavior. Does not appear distracted by internal stimuli.   Eye Contact:  Good  Speech:  More spontaneous.   Volume:  Better  Mood:  Feels better  Affect:  Restricted but mobilizing some positive affect  Thought Process:  Goal directed. Better speed of thought.   Orientation:  Full (Time, Place, and Person)  Thought Content:  Less negative ruminations. No thoughts of violence. No perceptual abnormalities. No delusional theme.    Suicidal Thoughts:  None today.  Homicidal Thoughts:  No  Memory:  Immediate;   Good Recent;   Good Remote;   Good  Judgement:  Good  Insight:  Good  Psychomotor Activity:  Normal  Concentration:  Better  Recall:  Good  Fund of Knowledge:  Good  Language:  Good  Akathisia:  Negative  Handed:    AIMS (if indicated):     Assets:  Communication Skills Desire for Improvement Financial Resources/Insurance Resilience  ADL's:  Limited  Cognition:  WNL  Sleep:  Number of Hours: 6.25     Treatment Plan Summary: Depression is lifting. She has not had any hallucination today. No suicidal thoughts today. She just had her depot. We would evaluate her further. Hopeful discharge over the weekend if she maintains progress.   Psychiatric: Schizoaffective  disorder SUD  Medical: HTN DM CVD HLD LBP  Psychosocial:  Multiple medical comorbidity Limited support  PLAN: 1. Continue current regimen 2. Continue to monitor mood, behavior and interaction with peers    Artist Beach, MD 05/24/2017, 5:15 PMPatient ID: [name], female   DOB: Jan 21, 1971, 46 y.o.   MRN: [id number] Patient ID: Rielle Schlauch, female   DOB: 08-08-1971, 46 y.o.   MRN: [id number]

## 2017-05-24 NOTE — Progress Notes (Addendum)
D: Patient observed resting in bed, isolative. Is assertive and verbalizes needs.  Patient verbalizes to this Probation officer that she is "okay." Denies SI to this Probation officer however on self inventory endorses passive SI. Denies intent, plan and verbal contract in place.  Patient's affect flat, sad and depressed with congruent mood.  States however that she feels ready for discharge tomorrow and feels she is mostly back at baseline. Per self inventory and discussions with writer, rates depression, hopelessness and anxiety all at a 10/10. Rates sleep as good, appetite as fair, energy as low and concentration as poor.  States goal for today is to "get better, talk, go to group." Denies pain, physical problems.   A: Medicated per orders, no prns requested or required. Level III obs in place for safety. Emotional support offered and self inventory reviewed. Encouraged completion of Suicide Safety Plan and programming participation. Discussed POC with MD, SW and notified MD of patient's BP this AM. Orders received and med admin. Also long acting abilify given IM and med education provided. Fall prevention plan in place and reviewed with patient as pt is a high fall risk due to CVA hx, neurological deficits and unsteady gait. Currently ambulating with walker without difficulty.   R: Patient verbalizes understanding of POC, falls prevention education.  Patient denies SI/HI/VH and remains safe on level III obs. Did endorse AH that put her down. Will continue to monitor closely and make verbal contact frequently. VS will be monitored throughout the day.

## 2017-05-24 NOTE — Progress Notes (Signed)
[facility] Notes:  (Nursing/MHT/Case Management/Adjunct)  Date:  05/24/2017  Time:  2045  Type of Therapy:  wrap up group  Participation Level:  Active  Participation Quality:  Appropriate, Attentive, Sharing and Supportive  Affect:  Appropriate  Cognitive:  Appropriate  Insight:  Improving  Engagement in Group:  Engaged  Modes of Intervention:  Clarification, Education and Support  Summary of Progress/Problems:Pt shared that if she could change any one thing in her life it would be the first time she used. Pt is grateful for the support she has from a friendship of 10 years.  Shellia Cleverly 05/24/2017, 9:44 PM

## 2017-05-24 NOTE — Progress Notes (Signed)
D: Patient seen ambulating well with walker. States her day was "ok". Presents with flat affect and depressed mood. Endorses depression and SI that "comes and go" but verbally contracts for safety. Denies pain at this time. No behavior issues noted.  A: Staff offered support and encouragement as needed. Routine safety checks maintained. Will continue to monitor  patient.  R: Patient remains safe on unit.

## 2017-05-24 NOTE — Progress Notes (Addendum)
Spencer PA notified of patient's high blood pressure this morning (see chart). New orders in.  CBG this morning  154 mg/dl. Got 4 units of Novolog

## 2017-05-24 NOTE — Progress Notes (Signed)
D: Pt was in the dayroom upon initial approach.  Pt presents with anxious, depressed affect and mood.  Describes her day as "pretty good" and reports goal is "try to feel a little better."  She reports the best part of her day was "waking up, not crying."  Pt denies SI/HI, denies hallucinations, denies pain.  Pt has been visible in milieu interacting with peers and staff appropriately.  Pt attended evening group.    A: Introduced self to pt.  Actively listened to pt and offered support and encouragement. Medications offered per order.  PRN medication administered for anxiety.  Q15 minute safety checks maintained.  R: Pt is safe on the unit.  Pt is compliant with medications except for Trazodone, which she refused.  Pt verbally contracts for safety.  Will continue to monitor and assess.

## 2017-05-25 LAB — BASIC METABOLIC PANEL
ANION GAP: 9 (ref 5–15)
BUN: 6 mg/dL (ref 6–20)
CALCIUM: 9.3 mg/dL (ref 8.9–10.3)
CO2: 28 mmol/L (ref 22–32)
Chloride: 104 mmol/L (ref 101–111)
Creatinine, Ser: 0.87 mg/dL (ref 0.44–1.00)
Glucose, Bld: 117 mg/dL — ABNORMAL HIGH (ref 65–99)
Potassium: 3.8 mmol/L (ref 3.5–5.1)
Sodium: 141 mmol/L (ref 135–145)

## 2017-05-25 LAB — GLUCOSE, CAPILLARY
GLUCOSE-CAPILLARY: 124 mg/dL — AB (ref 65–99)
Glucose-Capillary: 149 mg/dL — ABNORMAL HIGH (ref 65–99)
Glucose-Capillary: 165 mg/dL — ABNORMAL HIGH (ref 65–99)
Glucose-Capillary: 150 mg/dL — ABNORMAL HIGH (ref 65–99)

## 2017-05-25 MED ORDER — HYDRALAZINE HCL 25 MG PO TABS
25.0000 mg | ORAL_TABLET | Freq: Three times a day (TID) | ORAL | Status: DC
  Administered 2017-05-25 – 2017-05-26 (×2): 25 mg via ORAL
  Filled 2017-05-25 (×7): qty 1

## 2017-05-25 MED ORDER — CLONIDINE HCL 0.1 MG PO TABS
0.1000 mg | ORAL_TABLET | Freq: Three times a day (TID) | ORAL | Status: DC
  Administered 2017-05-25 – 2017-05-26 (×2): 0.1 mg via ORAL
  Filled 2017-05-25 (×7): qty 1

## 2017-05-25 MED ORDER — HYDRALAZINE HCL 25 MG PO TABS: 25.0000 mg | ORAL_TABLET | Freq: Three times a day (TID) | ORAL | Status: DC | PRN

## 2017-05-25 MED ORDER — METOPROLOL TARTRATE 12.5 MG HALF TABLET
12.5000 mg | ORAL_TABLET | Freq: Two times a day (BID) | ORAL | Status: DC
  Filled 2017-05-25 (×4): qty 1

## 2017-05-25 MED ORDER — METOPROLOL TARTRATE 25 MG PO TABS
25.0000 mg | ORAL_TABLET | Freq: Two times a day (BID) | ORAL | Status: DC
  Filled 2017-05-25 (×2): qty 1

## 2017-05-25 MED ORDER — HYDRALAZINE HCL 25 MG PO TABS: 25.0000 mg | ORAL_TABLET | Freq: Three times a day (TID) | ORAL | Status: DC

## 2017-05-25 NOTE — Progress Notes (Signed)
Nursing Note 05/25/2017 [id number]  Data Reports sleeping good with PRN sleep med.  Rates depression 5/10, hopelessness 5/10, and anxiety 10/10. Affect depressed.  Denies HI, SI, AVH.  Patient seen in dayroom throughout day "I'm trying to stay out the bed."  Took a short nap in the afternoon. Managing BP's all day, hypertensive. (see other notes re consult to hospitalist, etc.).  Action Spoke with patient 1:1, nurse offered support to patient throughout shift.  Continues to be monitored on 15 minute checks for safety.  Response Remains safe on unit, BP still uncontrolled.  Oncoming staff to assess and follow up.

## 2017-05-25 NOTE — Progress Notes (Signed)
[facility] MD Progress Note  05/25/2017 12:10 PM Diamond Rice  MRN:  [id number] Subjective:   46 y.o AAF, single, no kids,  resides at a rooming house. Background history of Schizoaffective Disorder, SUD and multiple medical issues. Was referred from [facility] on account of medical instability. Presented to them with worsening suicidal thoughts. Has been getting command auditory hallucinations to overdose on her medications. Significant history of suicidal attempts in the past. Intoxicated with cocaine and THC at presentation.   Chart reviewed today. Patient discussed at team today.  Staff reports that she has been appropriate. Patient has not been observed to be responding to internal stimuli. No behavioral issues. She has not voiced any futility thoughts.   Seen today. Says she feels good. She has not heard any voices since last review. Says she is feeling good again. Not feeling depressed. She feels she can cope at home. No suicidal thoughts. No thoughts of violence. No homicidal thoughts. Says her friend would pick her up tomorrow. No side effects from her medications  Principal Problem: Schizoaffective disorder ([place]) Diagnosis:   Patient Active Problem List   Diagnosis Date Noted  . Cannabis use disorder, moderate, dependence ([place]) [F12.20] 05/19/2017  . Cocaine use disorder, moderate, dependence ([place]) [F14.20] 05/19/2017  . Schizoaffective disorder ([place]) [F25.9] 10/07/2015  . OSA (obstructive sleep apnea) [G47.33] 06/09/2015  . Vitamin D deficiency [E55.9] 01/06/2015  . Bilateral knee pain [M25.561, M25.562] 01/05/2015  . Numbness and tingling in right hand [R20.0, R20.2] 01/05/2015  . Insomnia [G47.00] 12/07/2014  . Right-sided lacunar infarction ([place]) [I63.9] 09/15/2014  . Left hemiparesis ([place]) [G81.94] 09/15/2014  . Dyspnea [R06.00]   . Back pain [M54.9] 09/11/2013  . Psychoactive substance-induced organic mood disorder ([place]) [W09.81, F06.30] 05/28/2013  . Cocaine abuse with  cocaine-induced mood disorder ([place]) [F14.14] 05/28/2013  . Cannabis dependence with cannabis-induced anxiety disorder ([employer]) [F12.280] 05/28/2013  . Morbid obesity with BMI of 45.0-49.9, adult ([place]) [E66.01, Z68.42] 04/25/2012  . Chest pain [R07.9] 04/17/2012  . Hypertension [I10] 04/17/2012  . Diabetes mellitus type 2 with complications, uncontrolled ([place]) [E11.8, E11.65] 04/17/2012  . Hyperlipidemia [E78.5] 04/17/2012  . Tobacco use [Z72.0] 04/17/2012   Total Time spent with patient: 20 minutes  Past Psychiatric History: As in H&P  Past Medical History:  Past Medical History:  Diagnosis Date  . Anginal pain ([place])   . Anxiety   . Chronic lower back pain 04/17/2012   "just got over some; catched when I walked"  . COPD (chronic obstructive pulmonary disease) ([place])   . Depression   . Headache(784.0) 04/17/2012   "~ qod; lately waking up in am w/one"  . Heart attack ([place])   . High cholesterol   . Hypertension   . Migraines 04/17/2012  . Obesity   . Shortness of breath 04/17/2012   "all the time"  . Sleep apnea   . Stroke ([place])   . Type II diabetes mellitus ([place]) 08/28/2002    Past Surgical History:  Procedure Laterality Date  . CARDIAC CATHETERIZATION  ~ 2011   Family History:  Family History  Problem Relation Age of Onset  . Stroke Brother 6  . Stroke Brother 110  . Heart attack Mother 103  . Stroke Father 51   Family Psychiatric  History: As in H&P Social History:  History  Alcohol Use No    Comment: rare     History  Drug Use  . Types: Marijuana, "Crack" cocaine    Social History   Social History  .  Marital status: Single    Spouse name: N/A  . Number of children: N/A  . Years of education: N/A   Social History Main Topics  . Smoking status: Current Some Day Smoker    Packs/day: 0.00    Years: 10.00    Types: Cigarettes    Last attempt to quit: 12/07/2014  . Smokeless tobacco: Never Used     Comment: smoke a cigarette a day   . Alcohol use No      Comment: rare  . Drug use: Yes    Types: Marijuana, "Crack" cocaine  . Sexual activity: Not Currently   Other Topics Concern  . 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   Additional Social History:          Sleep: Good  Appetite:  Good  Current Medications: Current Facility-Administered Medications  Medication Dose Route Frequency Provider Last Rate Last Dose  . albuterol (PROVENTIL HFA;VENTOLIN HFA) 108 (90 Base) MCG/ACT inhaler 2 puff  2 puff Inhalation Q4H PRN Georgiann Cocker, MD   2 puff at 05/24/17 0816  . alum & mag hydroxide-simeth (MAALOX/MYLANTA) 200-200-20 MG/5ML suspension 30 mL  30 mL Oral Q4H PRN Rankin, Shuvon B, NP   30 mL at 05/23/17 0837  . amLODipine (NORVASC) tablet 10 mg  10 mg Oral Daily Money, Gerlene Burdock, FNP   10 mg at 05/25/17 0750  . ARIPiprazole (ABILIFY) tablet 15 mg  15 mg Oral Daily Izediuno, Delight Ovens, MD   15 mg at 05/25/17 0750  . ARIPiprazole ER SRER 300 mg  300 mg Intramuscular Q28 days Cobos, Rockey Situ, MD   300 mg at 05/24/17 1007  . ferrous sulfate tablet 325 mg  325 mg Oral BID WC Eappen, Saramma, MD   325 mg at 05/25/17 0750  . fluticasone (FLONASE) 50 MCG/ACT nasal spray 1 spray  1 spray Each Nare BID Money, Gerlene Burdock, FNP   1 spray at 05/25/17 0750  . guaiFENesin (MUCINEX) 12 hr tablet 1,200 mg  1,200 mg Oral BID PRN Nira Conn A, NP   1,200 mg at 05/23/17 0839  . hydrALAZINE (APRESOLINE) tablet 10 mg  10 mg Oral Q6H PRN Kerry Hough, PA-C   10 mg at 05/25/17 4315  . hydrOXYzine (ATARAX/VISTARIL) tablet 25 mg  25 mg Oral TID PRN Jackelyn Poling, NP   25 mg at 05/24/17 2101  . ibuprofen (ADVIL,MOTRIN) tablet 600 mg  600 mg Oral TID PRN Beryle Lathe, Justina A, NP   600 mg at 05/20/17 2132  . insulin aspart (novoLOG) injection 0-20 Units  0-20 Units Subcutaneous TID WC Jomarie Longs, MD   3 Units at 05/25/17 0624  . insulin aspart (novoLOG) injection 0-5 Units  0-5 Units Subcutaneous QHS  Jomarie Longs, MD   2 Units at 05/23/17 2134  . insulin glargine (LANTUS) injection 13 Units  13 Units Subcutaneous Daily Rankin, Shuvon B, NP   13 Units at 05/25/17 0755  . lisinopril (PRINIVIL,ZESTRIL) tablet 40 mg  40 mg Oral Daily Izediuno, Delight Ovens, MD   40 mg at 05/25/17 0749  . magnesium hydroxide (MILK OF MAGNESIA) suspension 30 mL  30 mL Oral Daily PRN Rankin, Shuvon B, NP   30 mL at 05/21/17 0819  . menthol-cetylpyridinium (CEPACOL) lozenge 3 mg  1 lozenge Oral PRN Nira Conn A, NP   3 mg at 05/23/17 4008  . metoprolol tartrate (LOPRESSOR) tablet 12.5 mg  12.5  mg Oral BID Izediuno, Laruth Bouchard, MD      . mirtazapine (REMERON) tablet 15 mg  15 mg Oral QHS Izediuno, Laruth Bouchard, MD   15 mg at 05/24/17 2101  . pantoprazole (PROTONIX) EC tablet 40 mg  40 mg Oral Daily Izediuno, Laruth Bouchard, MD   40 mg at 05/25/17 0750  . sertraline (ZOLOFT) tablet 100 mg  100 mg Oral Daily Izediuno, Laruth Bouchard, MD   100 mg at 05/25/17 0750  . traZODone (DESYREL) tablet 50 mg  50 mg Oral QHS,MR X 1 Lindon Romp A, NP   50 mg at 05/21/17 2201    Lab Results:  Results for orders placed or performed during the hospital encounter of 05/18/17 (from the past 48 hour(s))  Glucose, capillary     Status: Abnormal   Collection Time: 05/23/17 12:17 PM  Result Value Ref Range   Glucose-Capillary 150 (H) 65 - 99 mg/dL   Comment 1 Notify RN   Glucose, capillary     Status: Abnormal   Collection Time: 05/23/17  5:12 PM  Result Value Ref Range   Glucose-Capillary 174 (H) 65 - 99 mg/dL  Glucose, capillary     Status: Abnormal   Collection Time: 05/23/17  9:12 PM  Result Value Ref Range   Glucose-Capillary 209 (H) 65 - 99 mg/dL  Glucose, capillary     Status: Abnormal   Collection Time: 05/24/17  6:32 AM  Result Value Ref Range   Glucose-Capillary 154 (H) 65 - 99 mg/dL  Glucose, capillary     Status: Abnormal   Collection Time: 05/24/17 11:54 AM  Result Value Ref Range   Glucose-Capillary 159 (H) 65 - 99 mg/dL    Comment 1 Notify RN   Glucose, capillary     Status: Abnormal   Collection Time: 05/24/17  5:11 PM  Result Value Ref Range   Glucose-Capillary 213 (H) 65 - 99 mg/dL   Comment 1 Notify RN   Glucose, capillary     Status: Abnormal   Collection Time: 05/24/17  8:48 PM  Result Value Ref Range   Glucose-Capillary 156 (H) 65 - 99 mg/dL   Comment 1 Notify RN    Comment 2 Document in Chart   Glucose, capillary     Status: Abnormal   Collection Time: 05/25/17  6:01 AM  Result Value Ref Range   Glucose-Capillary 124 (H) 65 - 99 mg/dL   Comment 1 Notify RN    Comment 2 Document in Chart   Basic metabolic panel     Status: Abnormal   Collection Time: 05/25/17  6:23 AM  Result Value Ref Range   Sodium 141 135 - 145 mmol/L   Potassium 3.8 3.5 - 5.1 mmol/L   Chloride 104 101 - 111 mmol/L   CO2 28 22 - 32 mmol/L   Glucose, Bld 117 (H) 65 - 99 mg/dL   BUN 6 6 - 20 mg/dL   Creatinine, Ser 0.87 0.44 - 1.00 mg/dL   Calcium 9.3 8.9 - 10.3 mg/dL   GFR calc non Af Amer >60 >60 mL/min   GFR calc Af Amer >60 >60 mL/min    Comment: (NOTE) The eGFR has been calculated using the CKD EPI equation. This calculation has not been validated in all clinical situations. eGFR's persistently <60 mL/min signify possible Chronic Kidney Disease.    Anion gap 9 5 - 15    Comment: Performed at [facility], [place] [address], [place], [place] [address]  Glucose, capillary  Status: Abnormal   Collection Time: 05/25/17 12:08 PM  Result Value Ref Range   Glucose-Capillary 149 (H) 65 - 99 mg/dL    Blood Alcohol level:  Lab Results  Component Value Date   ETH <5 05/17/2017   ETH <5 43/32/9518    Metabolic Disorder Labs: Lab Results  Component Value Date   HGBA1C 12.3 (H) 10/09/2015   MPG 306 10/09/2015   MPG 249 (H) 08/28/2014   No results found for: PROLACTIN Lab Results  Component Value Date   CHOL 196 08/28/2014   TRIG 129 08/28/2014   HDL 45 08/28/2014   CHOLHDL 4.4  08/28/2014   VLDL 26 08/28/2014   LDLCALC 125 (H) 08/28/2014   LDLCALC 107 (H) 09/04/2013    Physical Findings: AIMS: Facial and Oral Movements Muscles of Facial Expression: None, normal Lips and Perioral Area: None, normal Jaw: None, normal Tongue: None, normal,Extremity Movements Upper (arms, wrists, hands, fingers): None, normal Lower (legs, knees, ankles, toes): None, normal, Trunk Movements Neck, shoulders, hips: None, normal, Overall Severity Severity of abnormal movements (highest score from questions above): None, normal Incapacitation due to abnormal movements: None, normal Patient's awareness of abnormal movements (rate only patient's report): No Awareness, Dental Status Current problems with teeth and/or dentures?: No Does patient usually wear dentures?: No  CIWA:  CIWA-Ar Total: 1 COWS:  COWS Total Score: 1  Musculoskeletal: Strength & Muscle Tone: decreased Gait & Station: mobilizes with a walker Patient leans: as above  Psychiatric Specialty Exam: Physical Exam  Constitutional: She is oriented to person, place, and time. No distress.  HENT:  Head: Normocephalic and atraumatic.  Respiratory: Effort normal.  Neurological: She is alert and oriented to person, place, and time.  Skin: She is not diaphoretic.  Psychiatric:  As above    ROS  Blood pressure 125/67, pulse 87, temperature 98.4 F (36.9 C), temperature source Oral, resp. rate 16, height '5\' 1"'$  (1.549 m), weight 103 kg (227 lb), last menstrual period 05/04/2017, SpO2 100 %.Body mass index is 42.89 kg/m.  General Appearance: Calm and cooperative.  Appropriate behavior. Does not appear distracted by internal stimuli.   Eye Contact:  Good  Speech:  Normal rate and tone   Volume:  Normal  Mood:  Euthymic  Affect:  Restricted but mobilizing some positive affect  Thought Process:  Goal directed. Normal speed of thought.   Orientation:  Full (Time, Place, and Person)  Thought Content:  No delusional  theme. No preoccupation with violent thoughts. No negative ruminations. No obsession.  No hallucination in any modality.   Suicidal Thoughts:  None today.  Homicidal Thoughts:  No  Memory:  Immediate;   Good Recent;   Good Remote;   Good  Judgement:  Good  Insight:  Good  Psychomotor Activity:  Normal  Concentration:  Good  Recall:  Good  Fund of Knowledge:  Good  Language:  Good  Akathisia:  Negative  Handed:    AIMS (if indicated):     Assets:  Communication Skills Desire for Improvement Financial Resources/Insurance Resilience  ADL's:  Limited  Cognition:  WNL  Sleep:  Number of Hours: 6.25     Treatment Plan Summary: Patient is responding well to treatment. No evidence of psychosis lately. No dangerousness lately. She is tolerating her medications well. We would evaluate her overnight. Hopeful discharge tomorrow if she maintains stability.    Psychiatric: Schizoaffective disorder SUD  Medical: HTN DM CVD HLD LBP  Psychosocial:  Multiple medical comorbidity Limited support  PLAN: 1.  Continue current regimen 2. Continue to monitor mood, behavior and interaction with peers    [place], MD 05/25/2017, 12:10 PMPatient ID: [name], female   DOB: [birth year], 46 y.o.   MRN: [id number] Patient ID: Bisma Klett, female   DOB: 30-Aug-1970, 46 y.o.   MRN: [id number] Patient ID: Nakeysha Pasqual, female   DOB: 09-21-1970, 46 y.o.   MRN: [id number]

## 2017-05-25 NOTE — [facility] Group Notes (Signed)
LCSW Group Therapy Note  05/25/2017 1:15pm  Type of Therapy and Topic:  Group Therapy:  Feelings around Relapse and Recovery  Participation Level: Active   Description of Group:    Patients in this group will discuss emotions they experience before and after a relapse. They will process how experiencing these feelings, or avoidance of experiencing them, relates to having a relapse. Facilitator will guide patients to explore emotions they have related to recovery. Patients will be encouraged to process which emotions are more powerful. They will be guided to discuss the emotional reaction significant others in their lives may have to their relapse or recovery. Patients will be assisted in exploring ways to respond to the emotions of others without this contributing to a relapse.  Therapeutic Goals: 1. Patient will identify two or more emotions that lead to a relapse for them 2. Patient will identify two emotions that result when they relapse 3. Patient will identify two emotions related to recovery 4. Patient will demonstrate ability to communicate their needs through discussion and/or role plays   Summary of Patient Progress: Pt was present for the duration of the group. Pt states that her main trigger for relapse is isolation. Pt states that when she spends too much time alone she feels the need to use. Pt mentioned that upon discharge she will try to make more of an effort to reach out to her supports. Pt's affect was appropriate and she offered meaningful feedback to peers during group.    Therapeutic Modalities:   Cognitive Behavioral Therapy Solution-Focused Therapy Assertiveness Training Relapse Prevention Therapy   Georga Kaufmann, MSW, [place] 05/25/2017 2:18 PM

## 2017-05-25 NOTE — Progress Notes (Signed)
BP 1710 was 174/112 pulse 82.  Given PRN Hydralazine.  MD contacted with BP concern, received order to increase Metoprolol to 25mg  BID.  Spoke with [facility], AC advised to contact MD and ask for order for hospitalist consult, eval and treat.  Order entered in [place].  Message sent to hospitalist pager via [place].  Rechecked BP 160/94 at 1820.  Spoke with hospitalist and gave hospitalist history over the phone.  Since patient discharging tomorrow RN requested hospitalist (Dr. Blaine Hamper) see patient.  Hospitalist gave new orders for scheduled hydralazine and clonidine and communicated he would not come and evaluate patient.  Communicated new orders to patient, instructed her to speak with social worker tomorrow regarding medical aftercare (getting a PCP and cardiologist).  Message left for weekend social worker on voicemail.

## 2017-05-25 NOTE — Progress Notes (Signed)
Recreation Therapy Notes  Date: 05/25/17 Time: 0930 Location: 500 Hall Dayroom  Group Topic: Stress Management  Goal Area(s) Addresses:  Patient will verbalize importance of using healthy stress management.  Patient will identify positive emotions associated with healthy stress management.   Intervention: Stress Management  Activity :  Guided Imagery.  LRT introduced the stress management technique of guided imagery.  Patients were to listen and follow along as LRT read a script to lead patients on a mental vacation through a meadow.    Education:  Stress Management, Discharge Planning.   Education Outcome: Acknowledges edcuation/In group clarification offered/Needs additional education  Clinical Observations/Feedback: Pt did not attend group.    Victorino Sparrow, LRT/CTRS         Ria Comment, Laria Grimmett A 05/25/2017 11:19 AM

## 2017-05-25 NOTE — Plan of Care (Signed)
Problem: Safety: Goal: Periods of time without injury will increase Outcome: Progressing Pt has not harmed self or others tonight.  She denies SI/HI and verbally contracts for safety.

## 2017-05-25 NOTE — Tx Team (Signed)
Interdisciplinary Treatment and Diagnostic Plan Update  05/25/2017 Time of Session: [place] MRN: [id number]  Principal Diagnosis: Schizoaffective disorder [facility])  Secondary Diagnoses: Principal Problem:   Schizoaffective disorder ([place]) Active Problems:   OSA (obstructive sleep apnea)   Cannabis use disorder, moderate, dependence (HCC)   Cocaine use disorder, moderate, dependence (HCC)   Current Medications:  Current Facility-Administered Medications  Medication Dose Route Frequency Provider Last Rate Last Dose  . albuterol (PROVENTIL HFA;VENTOLIN HFA) 108 (90 Base) MCG/ACT inhaler 2 puff  2 puff Inhalation Q4H PRN Artist Beach, MD   2 puff at 05/24/17 0816  . alum & mag hydroxide-simeth (MAALOX/MYLANTA) 200-200-20 MG/5ML suspension 30 mL  30 mL Oral Q4H PRN Rankin, Shuvon B, NP   30 mL at 05/23/17 0837  . amLODipine (NORVASC) tablet 10 mg  10 mg Oral Daily Money, Lowry Ram, FNP   10 mg at 05/25/17 0750  . ARIPiprazole (ABILIFY) tablet 15 mg  15 mg Oral Daily Izediuno, Laruth Bouchard, MD   15 mg at 05/25/17 0750  . ARIPiprazole ER SRER 300 mg  300 mg Intramuscular Q28 days Cobos, Myer Peer, MD   300 mg at 05/24/17 1007  . ferrous sulfate tablet 325 mg  325 mg Oral BID WC Eappen, Saramma, MD   325 mg at 05/25/17 0750  . fluticasone (FLONASE) 50 MCG/ACT nasal spray 1 spray  1 spray Each Nare BID Money, Lowry Ram, FNP   1 spray at 05/25/17 0750  . guaiFENesin (MUCINEX) 12 hr tablet 1,200 mg  1,200 mg Oral BID PRN Lindon Romp A, NP   1,200 mg at 05/23/17 0839  . hydrALAZINE (APRESOLINE) tablet 10 mg  10 mg Oral Q6H PRN Laverle Hobby, PA-C   10 mg at 05/25/17 6578  . hydrOXYzine (ATARAX/VISTARIL) tablet 25 mg  25 mg Oral TID PRN Rozetta Nunnery, NP   25 mg at 05/24/17 2101  . ibuprofen (ADVIL,MOTRIN) tablet 600 mg  600 mg Oral TID PRN Lu Duffel, Justina A, NP   600 mg at 05/20/17 2132  . insulin aspart (novoLOG) injection 0-20 Units  0-20 Units Subcutaneous TID WC Ursula Alert, MD   3 Units at 05/25/17 0624  . insulin aspart (novoLOG) injection 0-5 Units  0-5 Units Subcutaneous QHS Ursula Alert, MD   2 Units at 05/23/17 2134  . insulin glargine (LANTUS) injection 13 Units  13 Units Subcutaneous Daily Rankin, Shuvon B, NP   13 Units at 05/25/17 0755  . lisinopril (PRINIVIL,ZESTRIL) tablet 40 mg  40 mg Oral Daily Izediuno, Laruth Bouchard, MD   40 mg at 05/25/17 0749  . magnesium hydroxide (MILK OF MAGNESIA) suspension 30 mL  30 mL Oral Daily PRN Rankin, Shuvon B, NP   30 mL at 05/21/17 0819  . menthol-cetylpyridinium (CEPACOL) lozenge 3 mg  1 lozenge Oral PRN Lindon Romp A, NP   3 mg at 05/23/17 4696  . metoprolol tartrate (LOPRESSOR) tablet 12.5 mg  12.5 mg Oral BID Izediuno, Vincent A, MD      . mirtazapine (REMERON) tablet 15 mg  15 mg Oral QHS Izediuno, Laruth Bouchard, MD   15 mg at 05/24/17 2101  . pantoprazole (PROTONIX) EC tablet 40 mg  40 mg Oral Daily Izediuno, Laruth Bouchard, MD   40 mg at 05/25/17 0750  . sertraline (ZOLOFT) tablet 100 mg  100 mg Oral Daily Izediuno, Laruth Bouchard, MD   100 mg at 05/25/17 0750  . traZODone (DESYREL) tablet 50 mg  50 mg Oral QHS,MR  X 1 Lindon Romp A, NP   50 mg at 05/21/17 2201   PTA Medications: Prescriptions Prior to Admission  Medication Sig Dispense Refill Last Dose  . acetaminophen (TYLENOL) 325 MG tablet Take 2 tablets (650 mg total) by mouth every 6 (six) hours as needed for headache. (Patient not taking: Reported on 05/17/2017)   Unknown at Unknown time  . glucose blood (TRUETEST TEST) test strip 1 each by Other route 3 (three) times daily. Use as instructed: For blood sugar checks 100 each 3 Unknown at Unknown time  . Insulin Glargine (TOUJEO SOLOSTAR) 300 UNIT/ML SOPN Inject 13 Units into the skin daily. For diabetes management (Patient not taking: Reported on 05/17/2017) 1.5 mL 0 Unknown at Unknown time  . Insulin Pen Needle (B-D ULTRAFINE III SHORT PEN) 31G X 8 MM MISC 1 application by Does not apply route daily. For blood  sugar checks 1 each 0 Unknown at Unknown time  . Vitamin D, Ergocalciferol, (DRISDOL) 50000 units CAPS capsule Take 1 capsule (50,000 Units total) by mouth every 7 (seven) days. For 12 total weeks: For bone health (Patient not taking: Reported on 05/17/2017) 1 capsule 0 Unknown at Unknown time    Patient Stressors: Financial difficulties Medication change or noncompliance Occupational concerns Substance abuse Traumatic event  Patient Strengths: Capable of independent living Communication skills  Treatment Modalities: Medication Management, Group therapy, Case management,  1 to 1 session with clinician, Psychoeducation, Recreational therapy.   Physician Treatment Plan for Primary Diagnosis: Schizoaffective disorder ([place]) Long Term Goal(s): Improvement in symptoms so as ready for discharge Improvement in symptoms so as ready for discharge   Short Term Goals: Ability to identify and develop effective coping behaviors will improve Ability to maintain clinical measurements within normal limits will improve Compliance with prescribed medications will improve Ability to verbalize feelings will improve Ability to disclose and discuss suicidal ideas  Medication Management: Evaluate patient's response, side effects, and tolerance of medication regimen.  Therapeutic Interventions: 1 to 1 sessions, Unit Group sessions and Medication administration.  Evaluation of Outcomes: Progressing  Physician Treatment Plan for Secondary Diagnosis: Principal Problem:   Schizoaffective disorder ([place]) Active Problems:   OSA (obstructive sleep apnea)   Cannabis use disorder, moderate, dependence (HCC)   Cocaine use disorder, moderate, dependence ([place])  Long Term Goal(s): Improvement in symptoms so as ready for discharge Improvement in symptoms so as ready for discharge   Short Term Goals: Ability to identify and develop effective coping behaviors will improve Ability to maintain clinical measurements  within normal limits will improve Compliance with prescribed medications will improve Ability to verbalize feelings will improve Ability to disclose and discuss suicidal ideas     Medication Management: Evaluate patient's response, side effects, and tolerance of medication regimen.  Therapeutic Interventions: 1 to 1 sessions, Unit Group sessions and Medication administration.  Evaluation of Outcomes: Progressing   RN Treatment Plan for Primary Diagnosis: Schizoaffective disorder ([place]) Long Term Goal(s): Knowledge of disease and therapeutic regimen to maintain health will improve  Short Term Goals: Ability to remain free from injury will improve, Ability to verbalize frustration and anger appropriately will improve and Ability to identify and develop effective coping behaviors will improve  Medication Management: RN will administer medications as ordered by provider, will assess and evaluate patient's response and provide education to patient for prescribed medication. RN will report any adverse and/or side effects to prescribing provider.  Therapeutic Interventions: 1 on 1 counseling sessions, Psychoeducation, Medication administration, Evaluate responses to treatment, Monitor vital  signs and CBGs as ordered, Perform/monitor CIWA, COWS, AIMS and Fall Risk screenings as ordered, Perform wound care treatments as ordered.  Evaluation of Outcomes: Progressing   LCSW Treatment Plan for Primary Diagnosis: Schizoaffective disorder ([place]) Long Term Goal(s): Safe transition to appropriate next level of care at discharge, Engage patient in therapeutic group addressing interpersonal concerns.  Short Term Goals: Engage patient in aftercare planning with referrals and resources, Facilitate patient progression through stages of change regarding substance use diagnoses and concerns and Identify triggers associated with mental health/substance abuse issues  Therapeutic Interventions: Assess for all  discharge needs, 1 to 1 time with Social worker, Explore available resources and support systems, Assess for adequacy in community support network, Educate family and significant other(s) on suicide prevention, Complete Psychosocial Assessment, Interpersonal group therapy.  Evaluation of Outcomes: Progressing   Progress in Treatment: Attending groups: Yes. Participating in groups: Yes. Taking medication as prescribed: Yes. Toleration medication: Yes. Family/Significant other contact made:SPE completed with pt.  Patient understands diagnosis: Yes. Discussing patient identified problems/goals with staff: Yes. Medical problems stabilized or resolved: Yes. Denies suicidal/homicidal ideation: Yes. Issues/concerns per patient self-inventory: No. Other: n/a   New problem(s) identified: No, Describe:  n/a  New Short Term/Long Term Goal(s): medication management for mood stabilization, development of comprehensive mental wellness/sobriety plan, elimination of SI thoughts.   Discharge Plan or Barriers: Pt will return home and follow up with an outpatient provider. CSW also made residential substance use referrals to [facility] and [facility].   Patient Goal: "to get help for my mental problems and substance use. I'm not sure if I want inpatient or outpatient right now. I have to think about it more."   Reason for Continuation of Hospitalization: Anxiety Depression Medication stabilization Suicidal ideation  Estimated Length of Stay: 1-2 days; Estimated discharge date 05/27/17  Attendees: Patient: 05/25/2017 10:41 AM  Physician: Dr. Parke Poisson MD 05/25/2017 10:41 AM  Nursing: Vladimir Faster RN 05/25/2017 10:41 AM  RN Care Manager: Lars Pinks CM 05/25/2017 10:41 AM  Social Worker: Matthew Saras, [place] 05/25/2017 10:41 AM  Recreational Therapist: x 05/25/2017 10:41 AM  Other: Ricky Ala NP 05/25/2017 10:41 AM  Other:  05/25/2017 10:41 AM  Other: 05/25/2017 10:41 AM    Scribe for Treatment Team: Georga Kaufmann,  [place] 05/25/2017 10:41 AM

## 2017-05-25 NOTE — Progress Notes (Signed)
45 RN rechecked blood pressure post Hydralazine, 162/96 pulse 80.  MD aware  Given scheduled bp medicines, came down to 125/67 pulse 87 at 0900.  MD aware.

## 2017-05-25 NOTE — Progress Notes (Signed)
Pt attended AA group this evening.  

## 2017-05-25 NOTE — Progress Notes (Addendum)
This is a no charge note  I was called by Dr. Sanjuana Letters from [facility] to consult on Diamond Rice who was admitted to [facility] due to schizoaffective disorder with suicidal attempt.   Patient is a 46 year old lady with past medical history of schizoaffective disorder, hypertension, hyperlipidemia, diabetes mellitus, COPD, stroke 3 with left side weakness (per RN report), migraine headaches, back pain, OSA, CAD, myocardial infarction, cocaine abuse, depression, anxiety, who was admitted on 05/19/17 due to schizoaffective disorder with suicidal attempt. Per Dr. Sanjuana Letters, pt's blood pressure has not been controlled. This why we are asked to consult for. Pt had positive UDS for cocaine and THC   Today patient's blood pressure is 180/110 -195/118. Pt i is taking amlodipine 10 mg daily, lisinopril 40 mg daily, metoprolol 25 mg bid and prn hydralazine 10 mg tid. Pt does not have chest pain or any signs for new stroke.   Discussed with Dr. Sanjuana Letters and made some changes for her Bp meds as follows:  1. D/C metoprolol due to cocaine abuse  2. Start Clonidine 0.1 mg tid 3. Schedule oral hydralazine 25 mg 3 times a day 4. Increase prn hydralazine dose from 10 mg to 25 mg tid prn 5. Continue amlodipine 10 mg daily 6. Continue lisinopril 40 mg daily 7.  Please call back if needed     Ivor Costa, MD  [facility] Pager [phone]  If 7PM-7AM, please contact night-coverage [email or web address] Password [facility] 05/25/2017, 6:37 PM

## 2017-05-25 NOTE — Progress Notes (Signed)
  [facility] Adult Case Management Discharge Plan :  Will you be returning to the same living situation after discharge:  Yes,  pt returning home. At discharge, do you have transportation home?: Yes,  pt has access to transportation. Do you have the ability to pay for your medications: Yes,  pt has insurance.  Release of information consent forms completed and in the chart;  Patient's signature needed at discharge.  Patient to Follow up at: Follow-up Information    [facility] Follow up on 05/25/2017.   Specialty:  Behavioral Health Why:  Please go as a walk-in to be established for outpatient services. Walk-in hours are Mon-Fri 8am-3pm. You have also accepted Transitional Care Team, services begin on day of discharge. Contact information: [place] [address] [phone]        [facility] Follow up.   Specialty:  Addiction Medicine Why:  Social worker made a referral on your behalf. Please continue to call and check for bed availability with [facility]. Thank you. Contact information: [place] [address] [phone]           Next level of care provider has access to [place] and Suicide Prevention discussed: Yes,  with pt.  Have you used any form of tobacco in the last 30 days? (Cigarettes, Smokeless Tobacco, Cigars, and/or Pipes): Yes  Has patient been referred to the Quitline?: Patient refused referral  Patient has been referred for addiction treatment: Yes  Georga Kaufmann, MSW, LCSWA 05/25/2017, 3:36 PM

## 2017-05-25 NOTE — Progress Notes (Signed)
BP is 195/118 standing with pulse of 105; 182/113 sitting with pulse of 85.  PRN hydralazine administered and on-site provider notified.  Pt reports she feels "okay."  She denies needs and concerns at this time.

## 2017-05-26 ENCOUNTER — Emergency Department (HOSPITAL_COMMUNITY)
Admission: EM | Admit: 2017-05-26 | Discharge: 2017-05-26 | Disposition: A | Discharge status: Home / Self Care | Payer: Medicaid Other | Attending: Emergency Medicine | Admitting: Emergency Medicine

## 2017-05-26 ENCOUNTER — Other Ambulatory Visit: Payer: Self-pay

## 2017-05-26 ENCOUNTER — Emergency Department (HOSPITAL_COMMUNITY): Payer: Medicaid Other

## 2017-05-26 ENCOUNTER — Encounter (HOSPITAL_COMMUNITY): Payer: Self-pay | Admitting: Emergency Medicine

## 2017-05-26 DIAGNOSIS — I10 Essential (primary) hypertension: Secondary | ICD-10-CM | POA: Insufficient documentation

## 2017-05-26 DIAGNOSIS — F1220 Cannabis dependence, uncomplicated: Secondary | ICD-10-CM | POA: Insufficient documentation

## 2017-05-26 DIAGNOSIS — F1420 Cocaine dependence, uncomplicated: Secondary | ICD-10-CM | POA: Insufficient documentation

## 2017-05-26 DIAGNOSIS — J449 Chronic obstructive pulmonary disease, unspecified: Secondary | ICD-10-CM | POA: Insufficient documentation

## 2017-05-26 DIAGNOSIS — F17210 Nicotine dependence, cigarettes, uncomplicated: Secondary | ICD-10-CM | POA: Insufficient documentation

## 2017-05-26 DIAGNOSIS — F259 Schizoaffective disorder, unspecified: Secondary | ICD-10-CM | POA: Insufficient documentation

## 2017-05-26 DIAGNOSIS — E119 Type 2 diabetes mellitus without complications: Secondary | ICD-10-CM | POA: Insufficient documentation

## 2017-05-26 DIAGNOSIS — Z79899 Other long term (current) drug therapy: Secondary | ICD-10-CM | POA: Insufficient documentation

## 2017-05-26 DIAGNOSIS — R0789 Other chest pain: Secondary | ICD-10-CM | POA: Insufficient documentation

## 2017-05-26 DIAGNOSIS — Z8673 Personal history of transient ischemic attack (TIA), and cerebral infarction without residual deficits: Secondary | ICD-10-CM | POA: Insufficient documentation

## 2017-05-26 DIAGNOSIS — F419 Anxiety disorder, unspecified: Secondary | ICD-10-CM | POA: Insufficient documentation

## 2017-05-26 DIAGNOSIS — F329 Major depressive disorder, single episode, unspecified: Secondary | ICD-10-CM | POA: Insufficient documentation

## 2017-05-26 DIAGNOSIS — I252 Old myocardial infarction: Secondary | ICD-10-CM | POA: Insufficient documentation

## 2017-05-26 LAB — I-STAT TROPONIN, ED
TROPONIN I, POC: 0.01 ng/mL (ref 0.00–0.08)
TROPONIN I, POC: 0 ng/mL (ref 0.00–0.08)

## 2017-05-26 LAB — GLUCOSE, CAPILLARY: Glucose-Capillary: 115 mg/dL — ABNORMAL HIGH (ref 65–99)

## 2017-05-26 LAB — BASIC METABOLIC PANEL
Anion gap: 7 (ref 5–15)
BUN: 8 mg/dL (ref 6–20)
CALCIUM: 8.8 mg/dL — AB (ref 8.9–10.3)
CO2: 25 mmol/L (ref 22–32)
Chloride: 102 mmol/L (ref 101–111)
Creatinine, Ser: 1.25 mg/dL — ABNORMAL HIGH (ref 0.44–1.00)
GFR, EST AFRICAN AMERICAN: 59 mL/min — AB (ref >60)
GFR, EST NON AFRICAN AMERICAN: 51 mL/min — AB (ref >60)
Glucose, Bld: 286 mg/dL — ABNORMAL HIGH (ref 65–99)
Potassium: 3.4 mmol/L — ABNORMAL LOW (ref 3.5–5.1)
SODIUM: 134 mmol/L — AB (ref 135–145)

## 2017-05-26 LAB — CBC
HCT: 35.3 % — ABNORMAL LOW (ref 36.0–46.0)
Hemoglobin: 11.2 g/dL — ABNORMAL LOW (ref 12.0–15.0)
MCH: 23 pg — ABNORMAL LOW (ref 26.0–34.0)
MCHC: 31.7 g/dL (ref 30.0–36.0)
MCV: 72.5 fL — ABNORMAL LOW (ref 78.0–100.0)
Platelets: 360 10*3/uL (ref 150–400)
RBC: 4.87 MIL/uL (ref 3.87–5.11)
RDW: 15.7 % — ABNORMAL HIGH (ref 11.5–15.5)
WBC: 8.8 10*3/uL (ref 4.0–10.5)

## 2017-05-26 MED ORDER — GUAIFENESIN ER 600 MG PO TB12: 1200.0000 mg | ORAL_TABLET | Freq: Two times a day (BID) | ORAL | 0 refills | Status: DC | PRN

## 2017-05-26 MED ORDER — HYDROXYZINE HCL 25 MG PO TABS: 25.0000 mg | ORAL_TABLET | Freq: Three times a day (TID) | ORAL | 0 refills | Status: DC | PRN

## 2017-05-26 MED ORDER — SODIUM CHLORIDE 0.9 % IV BOLUS (SEPSIS): 1000.0000 mL | Freq: Once | INTRAVENOUS | Status: DC

## 2017-05-26 MED ORDER — FLUTICASONE PROPIONATE 50 MCG/ACT NA SUSP: 1.0000 | Freq: Two times a day (BID) | NASAL | 0 refills | Status: DC

## 2017-05-26 MED ORDER — ARIPIPRAZOLE ER 300 MG IM SRER: 300.0000 mg | INTRAMUSCULAR | 0 refills | Status: DC

## 2017-05-26 MED ORDER — SERTRALINE HCL 100 MG PO TABS: 100.0000 mg | ORAL_TABLET | Freq: Every day | ORAL | 0 refills | Status: DC

## 2017-05-26 MED ORDER — MIRTAZAPINE 15 MG PO TABS: 15.0000 mg | ORAL_TABLET | Freq: Every day | ORAL | 0 refills | Status: DC

## 2017-05-26 MED ORDER — HYDRALAZINE HCL 25 MG PO TABS: 25.0000 mg | ORAL_TABLET | Freq: Three times a day (TID) | ORAL | 0 refills | Status: DC

## 2017-05-26 MED ORDER — INSULIN GLARGINE 100 UNIT/ML ~~LOC~~ SOLN: 13.0000 [IU] | Freq: Every day | SUBCUTANEOUS | 0 refills | Status: DC

## 2017-05-26 MED ORDER — PANTOPRAZOLE SODIUM 40 MG PO TBEC: 40.0000 mg | DELAYED_RELEASE_TABLET | Freq: Every day | ORAL | 0 refills | Status: DC

## 2017-05-26 MED ORDER — AMLODIPINE BESYLATE 10 MG PO TABS: 10.0000 mg | ORAL_TABLET | Freq: Every day | ORAL | 0 refills | Status: DC

## 2017-05-26 MED ORDER — CLONIDINE HCL 0.1 MG PO TABS: 0.1000 mg | ORAL_TABLET | Freq: Three times a day (TID) | ORAL | 0 refills | Status: DC

## 2017-05-26 MED ORDER — ARIPIPRAZOLE 15 MG PO TABS: 15.0000 mg | ORAL_TABLET | Freq: Every day | ORAL | 0 refills | Status: DC

## 2017-05-26 MED ORDER — TRAZODONE HCL 50 MG PO TABS: 50.0000 mg | ORAL_TABLET | Freq: Every evening | ORAL | 0 refills | Status: DC | PRN

## 2017-05-26 MED ORDER — ALBUTEROL SULFATE HFA 108 (90 BASE) MCG/ACT IN AERS: 2.0000 | INHALATION_SPRAY | RESPIRATORY_TRACT | 0 refills | Status: DC | PRN

## 2017-05-26 MED ORDER — GI COCKTAIL ~~LOC~~
30.0000 mL | Freq: Once | ORAL | Status: AC
  Administered 2017-05-26: 30 mL via ORAL
  Filled 2017-05-26: qty 30

## 2017-05-26 MED ORDER — FERROUS SULFATE 325 (65 FE) MG PO TABS: 325.0000 mg | ORAL_TABLET | Freq: Two times a day (BID) | ORAL | 0 refills | Status: DC

## 2017-05-26 MED ORDER — LISINOPRIL 40 MG PO TABS: 40.0000 mg | ORAL_TABLET | Freq: Every day | ORAL | 0 refills | Status: DC

## 2017-05-26 NOTE — ED Provider Notes (Signed)
[facility] DEPT Provider Note   CSN: [id number] Arrival date & time: 05/26/17  1057     History   Chief Complaint Chief Complaint  Patient presents with  . Chest Pain    HPI Diamond Rice is a 46 y.o. female.  HPI 46 year old African-American female past medical history significant for COPD, hypertension, hyperlipidemia, diabetes, polysubstance abuse, anxiety, depression that presents to the emergency department today for complaints of acute onset substernal chest pain. Patient recently discharged from behavioral health for anxiety and depression to this morning. When she woke up she complained of substernal chest pain that did not radiate. Patient states the pain lasted for 30 minutes and self resolved. States that when it happened she was laying flat in the bed. Patient denies any associated diaphoresis, nausea, emesis, radiation. Does report mild shortness of breath. Patient denies any symptoms at this time.patient does take Protonix for acid reflux. States that she did "have a mild heart attack in the past". Patient was admitted 08/28/14 for same. Patient had normal echocardiogram. Cardiology was consult did not feel the patient needed further workup.patient has not followed up with cardiology since then. Denies any history of PE/DVT, prolonged immobilization, recent surgeries, OCP use. Does report some intermittent tobacco use and drug use. Denies any IV drug use.  Pt denies any fever, chill, ha, vision changes, lightheadedness, dizziness, congestion, neck pain, cough, abd pain, n/v/d, urinary symptoms, change in bowel habits, melena, hematochezia, lower extremity paresthesias.  Past Medical History:  Diagnosis Date  . Anginal pain ([place])   . Anxiety   . Chronic lower back pain 04/17/2012   "just got over some; catched when I walked"  . COPD (chronic obstructive pulmonary disease) ([place])   . Depression   . Headache(784.0) 04/17/2012   "~ qod; lately waking up in am w/one"  .  Heart attack ([place])   . High cholesterol   . Hypertension   . Migraines 04/17/2012  . Obesity   . Shortness of breath 04/17/2012   "all the time"  . Sleep apnea   . Stroke ([place])   . Type II diabetes mellitus ([place]) 08/28/2002    Patient Active Problem List   Diagnosis Date Noted  . Cannabis use disorder, moderate, dependence ([place]) 05/19/2017  . Cocaine use disorder, moderate, dependence ([place]) 05/19/2017  . Schizoaffective disorder ([place]) 10/07/2015  . OSA (obstructive sleep apnea) 06/09/2015  . Vitamin D deficiency 01/06/2015  . Bilateral knee pain 01/05/2015  . Numbness and tingling in right hand 01/05/2015  . Insomnia 12/07/2014  . Right-sided lacunar infarction ([place]) 09/15/2014  . Left hemiparesis ([place]) 09/15/2014  . Dyspnea   . Back pain 09/11/2013  . Psychoactive substance-induced organic mood disorder ([place]) 05/28/2013  . Cocaine abuse with cocaine-induced mood disorder ([place]) 05/28/2013  . Cannabis dependence with cannabis-induced anxiety disorder ([place]) 05/28/2013  . Morbid obesity with BMI of 45.0-49.9, adult ([place]) 04/25/2012  . Chest pain 04/17/2012  . Hypertension 04/17/2012  . Diabetes mellitus type 2 with complications, uncontrolled ([place]) 04/17/2012  . Hyperlipidemia 04/17/2012  . Tobacco use 04/17/2012    Past Surgical History:  Procedure Laterality Date  . CARDIAC CATHETERIZATION  ~ 2011    OB History    No data available       Home Medications    Prior to Admission medications   Medication Sig Start Date End Date Taking? Authorizing Provider  albuterol (PROVENTIL HFA;VENTOLIN HFA) 108 (90 Base) MCG/ACT inhaler Inhale 2 puffs into the lungs every 4 (four) hours as needed  for wheezing or shortness of breath. 05/26/17  Yes Money, Lowry Ram, FNP  amLODipine (NORVASC) 10 MG tablet Take 1 tablet (10 mg total) by mouth daily. For high blood pressure 05/27/17  Yes Money, Darnelle Maffucci B, FNP  ARIPiprazole (ABILIFY) 15 MG tablet Take 1 tablet (15 mg total) by mouth daily. For  mood control 05/27/17  Yes Money, Lowry Ram, FNP  ARIPiprazole ER 300 MG SRER Inject 300 mg into the muscle every 28 (twenty-eight) days. For mood control 06/21/17  Yes Money, Lowry Ram, FNP  cloNIDine (CATAPRES) 0.1 MG tablet Take 1 tablet (0.1 mg total) by mouth 3 (three) times daily. For high blood pressure 05/26/17  Yes Money, Lowry Ram, FNP  ferrous sulfate 325 (65 FE) MG tablet Take 1 tablet (325 mg total) by mouth 2 (two) times daily with a meal. 05/26/17  Yes Money, Lowry Ram, FNP  fluticasone (FLONASE) 50 MCG/ACT nasal spray Place 1 spray into both nostrils 2 (two) times daily. For allergies 05/26/17  Yes Money, Lowry Ram, FNP  guaiFENesin (MUCINEX) 600 MG 12 hr tablet Take 2 tablets (1,200 mg total) by mouth 2 (two) times daily as needed for cough or to loosen phlegm. 05/26/17  Yes Money, Lowry Ram, FNP  hydrALAZINE (APRESOLINE) 25 MG tablet Take 1 tablet (25 mg total) by mouth every 8 (eight) hours. For high blood pressure 05/26/17  Yes Money, Lowry Ram, FNP  hydrOXYzine (ATARAX/VISTARIL) 25 MG tablet Take 1 tablet (25 mg total) by mouth 3 (three) times daily as needed for anxiety. 05/26/17  Yes Money, Lowry Ram, FNP  insulin glargine (LANTUS) 100 UNIT/ML injection Inject 0.13 mLs (13 Units total) into the skin daily. For diabetes 05/27/17  Yes Money, Lowry Ram, FNP  lisinopril (PRINIVIL,ZESTRIL) 40 MG tablet Take 1 tablet (40 mg total) by mouth daily. For high blood pressure 05/27/17  Yes Money, Lowry Ram, FNP  mirtazapine (REMERON) 15 MG tablet Take 1 tablet (15 mg total) by mouth at bedtime. For mood control 05/26/17  Yes Money, Lowry Ram, FNP  pantoprazole (PROTONIX) 40 MG tablet Take 1 tablet (40 mg total) by mouth daily. For acid reflux 05/27/17  Yes Money, Lowry Ram, FNP  sertraline (ZOLOFT) 100 MG tablet Take 1 tablet (100 mg total) by mouth daily. For mood control 05/27/17  Yes Money, Lowry Ram, FNP  traZODone (DESYREL) 50 MG tablet Take 1 tablet (50 mg total) by mouth at bedtime as needed for sleep. 05/26/17    Money, Lowry Ram, FNP    Family History Family History  Problem Relation Age of Onset  . Stroke Brother 56  . Stroke Brother 39  . Heart attack Mother 12  . Stroke Father 25    Social History Social History  Substance Use Topics  . Smoking status: Current Some Day Smoker    Packs/day: 0.00    Years: 10.00    Types: Cigarettes    Last attempt to quit: 12/07/2014  . Smokeless tobacco: Never Used     Comment: smoke a cigarette a day   . Alcohol use No     Comment: rare     Allergies   Morphine and related   Review of Systems Review of Systems  Constitutional: Negative for chills, diaphoresis and fever.  HENT: Negative for congestion.   Eyes: Negative for visual disturbance.  Respiratory: Positive for shortness of breath. Negative for cough and wheezing.   Cardiovascular: Positive for chest pain. Negative for palpitations and leg swelling.  Gastrointestinal: Negative for abdominal pain, diarrhea, nausea  and vomiting.  Genitourinary: Negative for dysuria, flank pain, frequency, hematuria and urgency.  Musculoskeletal: Negative for arthralgias and myalgias.  Skin: Negative for rash.  Neurological: Negative for dizziness, syncope, weakness, light-headedness, numbness and headaches.  Psychiatric/Behavioral: Negative for sleep disturbance. The patient is not nervous/anxious.      Physical Exam Updated Vital Signs BP (!) 141/92 (BP Location: Right Arm)   Pulse 88   Temp 98.1 F (36.7 C) (Oral)   Resp 16   Ht 5\' 1"  (1.549 m)   Wt 103 kg (227 lb)   LMP 05/04/2017 (Exact Date)   SpO2 100%   BMI 42.89 kg/m   Physical Exam  Constitutional: She is oriented to person, place, and time. She appears well-developed and well-nourished.  Non-toxic appearance. No distress.  HENT:  Head: Normocephalic and atraumatic.  Nose: Nose normal.  Mouth/Throat: Oropharynx is clear and moist.  Eyes: Pupils are equal, round, and reactive to light. Conjunctivae are normal. Right eye  exhibits no discharge. Left eye exhibits no discharge.  Neck: Normal range of motion. Neck supple. No JVD present. No tracheal deviation present.  Cardiovascular: Normal rate, regular rhythm, normal heart sounds and intact distal pulses.  Exam reveals no gallop and no friction rub.   No murmur heard. Pulmonary/Chest: Effort normal and breath sounds normal. No respiratory distress. She has no wheezes. She has no rales. She exhibits no tenderness.  No hypoxia or tachypnea.  Abdominal: Soft. Bowel sounds are normal. She exhibits no distension. There is no tenderness. There is no rebound and no guarding.  Musculoskeletal: Normal range of motion.  No lower extremity edema or calf tenderness.  Lymphadenopathy:    She has no cervical adenopathy.  Neurological: She is alert and oriented to person, place, and time.  Skin: Skin is warm and dry. Capillary refill takes less than 2 seconds. She is not diaphoretic.  Psychiatric: Her behavior is normal. Judgment and thought content normal.  Nursing note and vitals reviewed.    ED Treatments / Results  Labs (all labs ordered are listed, but only abnormal results are displayed) Labs Reviewed  BASIC METABOLIC PANEL - Abnormal; Notable for the following:       Result Value   Sodium 134 (*)    Potassium 3.4 (*)    Glucose, Bld 286 (*)    Creatinine, Ser 1.25 (*)    Calcium 8.8 (*)    GFR calc non Af Amer 51 (*)    GFR calc Af Amer 59 (*)    All other components within normal limits  CBC - Abnormal; Notable for the following:    Hemoglobin 11.2 (*)    HCT 35.3 (*)    MCV 72.5 (*)    MCH 23.0 (*)    RDW 15.7 (*)    All other components within normal limits  I-STAT TROPONIN, ED  I-STAT TROPONIN, ED    EKG  EKG Interpretation None       Radiology Dg Chest 2 View  Result Date: 05/26/2017 CLINICAL DATA:  Chest pain EXAM: CHEST  2 VIEW COMPARISON:  05/17/2017 FINDINGS: Chronic cardiomegaly and aortic tortuosity. Stable negative hila.  Chronic scarring at the lingula. There is no edema, consolidation, effusion, or pneumothorax. Spondylosis. No acute osseous finding. IMPRESSION: 1. No acute finding. 2. Chronic cardiomegaly and lingular scarring. Electronically Signed   By: Monte Fantasia M.D.   On: 05/26/2017 11:45    Procedures Procedures (including critical care time)  Medications Ordered in ED Medications  gi cocktail (Maalox,Lidocaine,Donnatal) (30  mLs Oral Given 05/26/17 1536)     Initial Impression / Assessment and Plan / ED Course  I have reviewed the triage vital signs and the nursing notes.  Pertinent labs & imaging results that were available during my care of the patient were reviewed by me and considered in my medical decision making (see chart for details).     Pt presents to the Ed today with complaints of cp. Patient is to be discharged with recommendation to follow up with PCP in regards to today's hospital visit. Chest pain is not likely of cardiac or pulmonary etiology d/t presentation, perc negative, VSS, no tracheal deviation, no JVD or new murmur, RRR, breath sounds equal bilaterally, EKG without any change from prior tracing and shows no signs of ischemia ar reviewed by myself and Dr. Dayna Barker, negative delta troponin, and negative CXR, stable findings. patient's symptoms resolved prior to coming to the ED. Given GI cocktail and feel much improved. Hgb is stable. Mild elevation in creatine however seems pt baseline is around 1-1.1. Close follow up with pcp to have it rechecked. Presentation very atypical for acs. Doubt PE, dissection or pna. No sign of of fluid overload overload that would be concerning for CHF. Pt heart pathway score is 3. Needs close pcp and cardiology follow up and this was provided. Pt has been advised to return to the ED is CP becomes exertional, associated with diaphoresis or nausea, radiates to left jaw/arm, worsens or becomes concerning in any way. Pt appears reliable for follow up and  is agreeable to discharge.   Case has been discussed with  Dr. Roselyn Reef agrees with the above plan to discharge.     Final Clinical Impressions(s) / ED Diagnoses   Final diagnoses:  Atypical chest pain    New Prescriptions New Prescriptions   No medications on file     Aaron Edelman 05/26/17 1625    Mesner, Corene Cornea, MD 05/26/17 1655

## 2017-05-26 NOTE — Progress Notes (Addendum)
D: Pt was in the dayroom upon initial approach.  Pt presents with anxious, depressed affect and mood.  Describes her day as "so far, so good."  Goal is to "try to get my mind right, get better."  Pt denies SI/HI, denies hallucinations, complains of bilateral leg pain of 8/10.  Pt has been visible in milieu interacting with peers and staff appropriately.  Pt attended evening group.  Pt has been ambulating with her walker at times.  Pt encouraged to use walker.    A: Introduced self to pt.  Actively listened to pt and offered support and encouragement. Medications offered per order.  PRN medication administered for pain and anxiety.  Q15 minute safety checks maintained.  R: Pt is safe on the unit.  Pt is compliant with medications except for Trazodone, which she refused.  Pt verbally contracts for safety.  Will continue to monitor and assess.

## 2017-05-26 NOTE — Progress Notes (Addendum)
Discharge note:  Patient discharged home per MD order. Patient will continue to follow up with [facility] for a bed.  Patient was complaining of chest pain this morning and an EKG was completed and reviewed by NP and MD.  Patient is cleared by psychiatry and it has been recommended by NP, nurse and MD that patient proceed to [facility] for further evaluation.  Patient denies any thought of self harm. She did indicate she was passively suicidal on her self inventory and they are intermittent thoughts without a specific plan. She rates her depression and hopelessness as an 8; anxiety as a 10.  She denies any thoughts of hurting anyone else and she does not appear to be responding to internal stimuli.  Patient was resistant to idea of going to ED after she found out she was being discharged today.  Will call [facility] transportation to transport her to [facility].  Reviewed AVS/transition record with patient and she indicated understanding.  She was given samples of her abilify and prescriptions.  Patient left ambulatory with [facility] transportation.

## 2017-05-26 NOTE — Plan of Care (Signed)
Problem: Activity: Goal: Sleeping patterns will improve Outcome: Progressing Slept 6.25 hours last night according to flowsheet.

## 2017-05-26 NOTE — ED Triage Notes (Signed)
Pt. Stated, I was just discharged from behavior health and then I started having chest pain, no other symptoms.

## 2017-05-26 NOTE — BHH Suicide Risk Assessment (Signed)
[facility] Discharge Suicide Risk Assessment   Principal Problem: Schizoaffective disorder [facility]) Discharge Diagnoses:  Patient Active Problem List   Diagnosis Date Noted  . Cannabis use disorder, moderate, dependence ([place]) [F12.20] 05/19/2017  . Cocaine use disorder, moderate, dependence ([place]) [F14.20] 05/19/2017  . Schizoaffective disorder ([place]) [F25.9] 10/07/2015  . OSA (obstructive sleep apnea) [G47.33] 06/09/2015  . Vitamin D deficiency [E55.9] 01/06/2015  . Bilateral knee pain [M25.561, M25.562] 01/05/2015  . Numbness and tingling in right hand [R20.0, R20.2] 01/05/2015  . Insomnia [G47.00] 12/07/2014  . Right-sided lacunar infarction ([place]) [I63.9] 09/15/2014  . Left hemiparesis ([place]) [G81.94] 09/15/2014  . Dyspnea [R06.00]   . Back pain [M54.9] 09/11/2013  . Psychoactive substance-induced organic mood disorder ([place]) [P38.25, F06.30] 05/28/2013  . Cocaine abuse with cocaine-induced mood disorder ([place]) [F14.14] 05/28/2013  . Cannabis dependence with cannabis-induced anxiety disorder ([place]) [F12.280] 05/28/2013  . Morbid obesity with BMI of 45.0-49.9, adult ([place]) [E66.01, Z68.42] 04/25/2012  . Chest pain [R07.9] 04/17/2012  . Hypertension [I10] 04/17/2012  . Diabetes mellitus type 2 with complications, uncontrolled ([place]) [E11.8, E11.65] 04/17/2012  . Hyperlipidemia [E78.5] 04/17/2012  . Tobacco use [Z72.0] 04/17/2012    Total Time spent with patient: 30 minutes  Musculoskeletal: Strength & Muscle Tone: within normal limits Gait & Station: Mobilizes with a walker Patient leans: N/A  Psychiatric Specialty Exam: Review of Systems  Constitutional: Negative.   HENT: Negative.   Eyes: Negative.   Respiratory: Negative.   Cardiovascular: Negative.   Gastrointestinal: Negative.   Genitourinary: Negative.   Musculoskeletal: Negative.   Skin: Negative.   Neurological: Negative.   Endo/Heme/Allergies: Negative.   Psychiatric/Behavioral: Negative for depression, hallucinations,  memory loss, substance abuse and suicidal ideas. The patient is not nervous/anxious and does not have insomnia.     Blood pressure 140/90, pulse 97, temperature 98.4 F (36.9 C), temperature source Oral, resp. rate 16, height 5\' 1"  (1.549 m), weight 103 kg (227 lb), last menstrual period 05/04/2017, SpO2 100 %.Body mass index is 42.89 kg/m.  General Appearance: In hospital clothing, pleasant, not in any distress, calm and cooperative. Good relatedness. No internal distraction.   Eye Contact::  Good  Speech:  Clear and Coherent and Normal Rate409  Volume:  Normal  Mood:  Euthymic  Affect:  Appropriate and Full Range  Thought Process:  Linear  Orientation:  Full (Time, Place, and Person)  Thought Content:  No delusional theme. No preoccupation with violent thoughts. No negative ruminations. No obsession.  No hallucination in any modality.   Suicidal Thoughts:  No  Homicidal Thoughts:  No  Memory:  Immediate;   Good Recent;   Good Remote;   Good  Judgement:  Good  Insight:  Good  Psychomotor Activity:  Normal  Concentration:  Good  Recall:  Good  Fund of Knowledge:Good  Language: Good  Akathisia:  Negative  Handed:    AIMS (if indicated):     Assets:  Communication Skills Desire for Improvement Financial Resources/Insurance Housing Resilience  Sleep:  Number of Hours: 6.75  Cognition: WNL  ADL's:  Intact   Clinical  Assessment::   46 y.o AAF, single, no kids,  resides at a rooming house. Background history of Schizoaffective Disorder, SUD and multiple medical issues. Was referred from [facility] on account of medical instability. Presented to them with worsening suicidal thoughts. Has been getting command auditory hallucinations to overdose on her medications. Significant history of suicidal attempts in the past. Intoxicated with cocaine and THC at presentation.   Seen today. Had mild  chest pain this morning. Says it is less. Would rather be discharged and go to the ER from here  than the other way round. Was reviewed yesterday by the medical team and her blood pressure medications adjusted. Mentally states that she is doing well. No hallucinations in any modality. No feeling of persecution. No other form of delusion. No passivity of thought. No passivity of will. Her depression has lifted. She is able to enjoy routines again. She is has normal biological functions. She is able to think clearly. No longer having suicidal thoughts. No homicidal thoughts. No thoughts of violence. We discussed effects of cocaine on her on her physical and mental health. Patient understands that it can cause strokes and heart attacks. Patient denies any cravings. No access to weapons.   Nursing staff reports that patient has been appropriate on the unit. Patient has been interacting well with peers. No behavioral issues. Patient has not voiced any suicidal thoughts. Patient has not been observed to be internally stimulated. Patient has been adherent with treatment recommendations. Patient has been tolerating their medication well.   Patient was discussed at team. Team members feels that patient is back to her baseline level of function. Team agrees with plan to discharge patient today.  Demographic Factors:  Living alone  Loss Factors: Decline in physical health  Historical Factors: Prior suicide attempts  Risk Reduction Factors:   Religious beliefs about death, Positive social support, Positive therapeutic relationship and Positive coping skills or problem solving skills  Continued Clinical Symptoms:   as above   Cognitive Features That Contribute To Risk:  None    Suicide Risk:  Minimal: No identifiable suicidal ideation.  Patient is not having any thoughts of suicide at this time. Modifiable risk factors targeted during this admission includes depression, psychosis and substance use. Demographical and historical risk factors cannot be modified. Patient is now engaging well. Patient is  reliable and is future oriented. We have buffered patient's support structures. At this point, patient is at low risk of suicide. Patient is aware of the effects of psychoactive substances on decision making process. Patient has been provided with emergency contacts. Patient acknowledges to use resources provided if unforseen circumstances changes their current risk stratification.    Follow-up Information    [facility] Follow up on 05/25/2017.   Specialty:  Behavioral Health Why:  Please go as a walk-in to be established for outpatient services. Walk-in hours are Mon-Fri 8am-3pm. You have also accepted Transitional Care Team, services begin on day of discharge. Contact information: [place] [address] [phone]        [facility] Follow up.   Specialty:  Addiction Medicine Why:  Social worker made a referral on your behalf. Please continue to call and check for bed availability with [facility]. Thank you. Contact information: [place] [address] [phone]           Plan Of Care/Follow-up recommendations:  1. Continue current psychotropic medications 2. Mental health and addiction follow up as arranged.  3. Provided limited quantity of prescriptions   [place], MD 05/26/2017, 9:57 AM

## 2017-05-26 NOTE — Discharge Instructions (Signed)
Your lab work and imaging has been reassuring. Unknown etiology of her symptoms. May be due to acid reflux. Make she continue her Protonix. Make sure you follow up with her primary care doctor. Here kidney function is slightly elevated. May be due to dehydration. Drink plenty of fluids. Recheck with your primary care.Have given referral to cardiology. Return to the ED if your symptoms worsen.
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
## 2017-05-26 NOTE — ED Notes (Signed)
Pt states gi cocktail reduced any chest pressure.

## 2017-05-26 NOTE — [facility] Group Notes (Signed)
[facility] Group Notes: (Clinical Social Work)   05/26/2017   1:15-2:15pm   Type of Therapy:  Group Therapy   Participation Level:  Did Not Attend - already discharged   Selmer Dominion, LCSW 05/26/2017, 2:56 PM

## 2017-09-08 ENCOUNTER — Emergency Department (HOSPITAL_COMMUNITY): Payer: Medicaid Other

## 2017-09-08 ENCOUNTER — Other Ambulatory Visit: Payer: Self-pay

## 2017-09-08 ENCOUNTER — Emergency Department (HOSPITAL_COMMUNITY)
Admission: EM | Admit: 2017-09-08 | Discharge: 2017-09-09 | Disposition: A | Discharge status: Other Acute Inpatient Hospital | Payer: Medicaid Other | Attending: Emergency Medicine | Admitting: Emergency Medicine

## 2017-09-08 DIAGNOSIS — F1210 Cannabis abuse, uncomplicated: Secondary | ICD-10-CM | POA: Insufficient documentation

## 2017-09-08 DIAGNOSIS — I10 Essential (primary) hypertension: Secondary | ICD-10-CM | POA: Insufficient documentation

## 2017-09-08 DIAGNOSIS — R45850 Homicidal ideations: Secondary | ICD-10-CM | POA: Insufficient documentation

## 2017-09-08 DIAGNOSIS — R0602 Shortness of breath: Secondary | ICD-10-CM | POA: Insufficient documentation

## 2017-09-08 DIAGNOSIS — F1410 Cocaine abuse, uncomplicated: Secondary | ICD-10-CM | POA: Diagnosis not present

## 2017-09-08 DIAGNOSIS — Z79899 Other long term (current) drug therapy: Secondary | ICD-10-CM | POA: Insufficient documentation

## 2017-09-08 DIAGNOSIS — J449 Chronic obstructive pulmonary disease, unspecified: Secondary | ICD-10-CM | POA: Insufficient documentation

## 2017-09-08 DIAGNOSIS — R45851 Suicidal ideations: Secondary | ICD-10-CM | POA: Insufficient documentation

## 2017-09-08 DIAGNOSIS — I252 Old myocardial infarction: Secondary | ICD-10-CM | POA: Diagnosis not present

## 2017-09-08 DIAGNOSIS — F17210 Nicotine dependence, cigarettes, uncomplicated: Secondary | ICD-10-CM | POA: Diagnosis not present

## 2017-09-08 DIAGNOSIS — Z8673 Personal history of transient ischemic attack (TIA), and cerebral infarction without residual deficits: Secondary | ICD-10-CM | POA: Insufficient documentation

## 2017-09-08 DIAGNOSIS — E119 Type 2 diabetes mellitus without complications: Secondary | ICD-10-CM | POA: Diagnosis not present

## 2017-09-08 DIAGNOSIS — R440 Auditory hallucinations: Secondary | ICD-10-CM | POA: Diagnosis not present

## 2017-09-08 DIAGNOSIS — R079 Chest pain, unspecified: Secondary | ICD-10-CM

## 2017-09-08 DIAGNOSIS — R0789 Other chest pain: Secondary | ICD-10-CM | POA: Insufficient documentation

## 2017-09-08 DIAGNOSIS — Z794 Long term (current) use of insulin: Secondary | ICD-10-CM | POA: Diagnosis not present

## 2017-09-08 DIAGNOSIS — F1414 Cocaine abuse with cocaine-induced mood disorder: Secondary | ICD-10-CM | POA: Diagnosis present

## 2017-09-08 LAB — RAPID URINE DRUG SCREEN, HOSP PERFORMED
Amphetamines: NOT DETECTED
Barbiturates: NOT DETECTED
Benzodiazepines: NOT DETECTED
Cocaine: POSITIVE — AB
Opiates: NOT DETECTED
Tetrahydrocannabinol: POSITIVE — AB

## 2017-09-08 LAB — I-STAT TROPONIN, ED: Troponin i, poc: 0 ng/mL (ref 0.00–0.08)

## 2017-09-08 LAB — SALICYLATE LEVEL: Salicylate Lvl: <7 mg/dL (ref 2.8–30.0)

## 2017-09-08 LAB — CBC
HCT: 38.9 % (ref 36.0–46.0)
Hemoglobin: 12.6 g/dL (ref 12.0–15.0)
MCH: 23.2 pg — ABNORMAL LOW (ref 26.0–34.0)
MCHC: 32.4 g/dL (ref 30.0–36.0)
MCV: 71.8 fL — ABNORMAL LOW (ref 78.0–100.0)
Platelets: 332 10*3/uL (ref 150–400)
RBC: 5.42 MIL/uL — ABNORMAL HIGH (ref 3.87–5.11)
RDW: 15.5 % (ref 11.5–15.5)
WBC: 9.3 10*3/uL (ref 4.0–10.5)

## 2017-09-08 LAB — I-STAT BETA HCG BLOOD, ED (MC, WL, AP ONLY): I-stat hCG, quantitative: <5 m[IU]/mL (ref <5)

## 2017-09-08 LAB — ETHANOL: Alcohol, Ethyl (B): <10 mg/dL (ref <10)

## 2017-09-08 LAB — BASIC METABOLIC PANEL
Anion gap: 10 (ref 5–15)
BUN: 14 mg/dL (ref 6–20)
CO2: 25 mmol/L (ref 22–32)
Calcium: 9.3 mg/dL (ref 8.9–10.3)
Chloride: 100 mmol/L — ABNORMAL LOW (ref 101–111)
Creatinine, Ser: 1.33 mg/dL — ABNORMAL HIGH (ref 0.44–1.00)
GFR calc Af Amer: 55 mL/min — ABNORMAL LOW (ref >60)
GFR calc non Af Amer: 47 mL/min — ABNORMAL LOW (ref >60)
Glucose, Bld: 231 mg/dL — ABNORMAL HIGH (ref 65–99)
Potassium: 3.7 mmol/L (ref 3.5–5.1)
Sodium: 135 mmol/L (ref 135–145)

## 2017-09-08 LAB — ACETAMINOPHEN LEVEL: Acetaminophen (Tylenol), Serum: <10 ug/mL — ABNORMAL LOW (ref 10–30)

## 2017-09-08 MED ORDER — HYDRALAZINE HCL 25 MG PO TABS
25.0000 mg | ORAL_TABLET | Freq: Three times a day (TID) | ORAL | Status: DC
  Administered 2017-09-08 – 2017-09-09 (×3): 25 mg via ORAL
  Filled 2017-09-08 (×3): qty 1

## 2017-09-08 MED ORDER — LISINOPRIL 20 MG PO TABS
40.0000 mg | ORAL_TABLET | Freq: Every day | ORAL | Status: DC
  Administered 2017-09-08 – 2017-09-09 (×2): 40 mg via ORAL
  Filled 2017-09-08 (×2): qty 2

## 2017-09-08 MED ORDER — FLUTICASONE PROPIONATE 50 MCG/ACT NA SUSP
1.0000 | Freq: Two times a day (BID) | NASAL | Status: DC
  Filled 2017-09-08: qty 16

## 2017-09-08 MED ORDER — HYDROXYZINE HCL 25 MG PO TABS: 25.0000 mg | ORAL_TABLET | Freq: Three times a day (TID) | ORAL | Status: DC | PRN

## 2017-09-08 MED ORDER — NICOTINE 21 MG/24HR TD PT24
21.0000 mg | MEDICATED_PATCH | Freq: Every day | TRANSDERMAL | Status: DC
  Filled 2017-09-08: qty 1

## 2017-09-08 MED ORDER — TRAZODONE HCL 50 MG PO TABS: 50.0000 mg | ORAL_TABLET | Freq: Every evening | ORAL | Status: DC | PRN

## 2017-09-08 MED ORDER — CLONIDINE HCL 0.2 MG PO TABS
0.1000 mg | ORAL_TABLET | Freq: Three times a day (TID) | ORAL | Status: DC
  Administered 2017-09-08 – 2017-09-09 (×2): 0.1 mg via ORAL
  Filled 2017-09-08 (×3): qty 1

## 2017-09-08 MED ORDER — ALUM & MAG HYDROXIDE-SIMETH 200-200-20 MG/5ML PO SUSP: 30.0000 mL | Freq: Four times a day (QID) | ORAL | Status: DC | PRN

## 2017-09-08 MED ORDER — ARIPIPRAZOLE 5 MG PO TABS
15.0000 mg | ORAL_TABLET | Freq: Every day | ORAL | Status: DC
  Administered 2017-09-08 – 2017-09-09 (×2): 15 mg via ORAL
  Filled 2017-09-08: qty 2
  Filled 2017-09-08: qty 1

## 2017-09-08 MED ORDER — AMLODIPINE BESYLATE 5 MG PO TABS
10.0000 mg | ORAL_TABLET | Freq: Every day | ORAL | Status: DC
  Administered 2017-09-08 – 2017-09-09 (×2): 10 mg via ORAL
  Filled 2017-09-08 (×2): qty 2

## 2017-09-08 MED ORDER — MIRTAZAPINE 15 MG PO TABS
15.0000 mg | ORAL_TABLET | Freq: Every day | ORAL | Status: DC
  Administered 2017-09-08: 15 mg via ORAL
  Filled 2017-09-08: qty 1

## 2017-09-08 MED ORDER — PANTOPRAZOLE SODIUM 40 MG PO TBEC
40.0000 mg | DELAYED_RELEASE_TABLET | Freq: Every day | ORAL | Status: DC
  Administered 2017-09-08 – 2017-09-09 (×2): 40 mg via ORAL
  Filled 2017-09-08 (×2): qty 1

## 2017-09-08 MED ORDER — IBUPROFEN 400 MG PO TABS: 600.0000 mg | ORAL_TABLET | Freq: Three times a day (TID) | ORAL | Status: DC | PRN

## 2017-09-08 MED ORDER — ONDANSETRON HCL 4 MG PO TABS: 4.0000 mg | ORAL_TABLET | Freq: Three times a day (TID) | ORAL | Status: DC | PRN

## 2017-09-08 MED ORDER — FERROUS SULFATE 325 (65 FE) MG PO TABS
325.0000 mg | ORAL_TABLET | Freq: Two times a day (BID) | ORAL | Status: DC
  Administered 2017-09-08 – 2017-09-09 (×2): 325 mg via ORAL
  Filled 2017-09-08 (×2): qty 1

## 2017-09-08 MED ORDER — SERTRALINE HCL 100 MG PO TABS
100.0000 mg | ORAL_TABLET | Freq: Every day | ORAL | Status: DC
  Administered 2017-09-08 – 2017-09-09 (×2): 100 mg via ORAL
  Filled 2017-09-08 (×2): qty 1

## 2017-09-08 NOTE — ED Notes (Signed)
Pt vomit x 1; scrubs changed.
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
## 2017-09-08 NOTE — ED Notes (Signed)
Pt given sandwich and drink per EDP.

## 2017-09-08 NOTE — ED Triage Notes (Addendum)
Pt states depression for several months, took herself off of her medications. States chest pain to right chest non radiating for 3 days. Pt also states suicidal ideation, tearful at triage. Pt endorses a suicide attempt last year. Also reports cocaine and marijuana use.

## 2017-09-08 NOTE — ED Provider Notes (Signed)
[facility] EMERGENCY DEPARTMENT Provider Note   CSN: [id number] Arrival date & time: 09/08/17  1250     History   Chief Complaint Chief Complaint  Patient presents with  . Chest Pain  . Depression  . Suicidal    HPI    Diamond Rice is a 47 y.o. female complaining of pressure-like generalized anterior chest pain onset yesterday while she was at rest, lasting 30 minutes and resolving spontaneously.  She has persistent shortness of breath today with no associated nausea, diaphoresis.  Patient also has depression with suicidal ideation, homicidal ideation she states that she has been taking cocaine and smoking marijuana recently.  She has a prior suicide attempts with overdose.  She has command auditory hallucinations telling her to kill herself and that she is worthless.  She has a history of a "light heart attack."  Past medical history of tobacco use, obesity, diabetes, hypertension, hyperlipidemia, CVA.  She is not been taking her medication for several months including her psychiatric medications.  No exacerbating or alleviating factors to her pain  Past Medical History:  Diagnosis Date  . Anginal pain ([place])   . Anxiety   . Chronic lower back pain 04/17/2012   "just got over some; catched when I walked"  . COPD (chronic obstructive pulmonary disease) ([place])   . Depression   . Headache(784.0) 04/17/2012   "~ qod; lately waking up in am w/one"  . Heart attack ([place])   . High cholesterol   . Hypertension   . Migraines 04/17/2012  . Obesity   . Shortness of breath 04/17/2012   "all the time"  . Sleep apnea   . Stroke ([place])   . Type II diabetes mellitus ([place]) 08/28/2002    Patient Active Problem List   Diagnosis Date Noted  . Cannabis use disorder, moderate, dependence ([place]) 05/19/2017  . Cocaine use disorder, moderate, dependence ([place]) 05/19/2017  . Schizoaffective disorder ([place]) 10/07/2015  . OSA (obstructive sleep apnea) 06/09/2015  . Vitamin D  deficiency 01/06/2015  . Bilateral knee pain 01/05/2015  . Numbness and tingling in right hand 01/05/2015  . Insomnia 12/07/2014  . Right-sided lacunar infarction 09/15/2014  . Left hemiparesis ([place]) 09/15/2014  . Dyspnea   . Back pain 09/11/2013  . Psychoactive substance-induced organic mood disorder ([place]) 05/28/2013  . Cocaine abuse with cocaine-induced mood disorder ([place]) 05/28/2013  . Cannabis dependence with cannabis-induced anxiety disorder ([place]) 05/28/2013  . Morbid obesity with BMI of 45.0-49.9, adult ([place]) 04/25/2012  . Chest pain 04/17/2012  . Hypertension 04/17/2012  . Diabetes mellitus type 2 with complications, uncontrolled ([place]) 04/17/2012  . Hyperlipidemia 04/17/2012  . Tobacco use 04/17/2012    Past Surgical History:  Procedure Laterality Date  . CARDIAC CATHETERIZATION  ~ 2011    OB History    No data available       Home Medications    Prior to Admission medications   Medication Sig Start Date End Date Taking? Authorizing Provider  albuterol (PROVENTIL HFA;VENTOLIN HFA) 108 (90 Base) MCG/ACT inhaler Inhale 2 puffs into the lungs every 4 (four) hours as needed for wheezing or shortness of breath. 05/26/17  Yes Money, Lowry Ram, FNP  amLODipine (NORVASC) 10 MG tablet Take 1 tablet (10 mg total) by mouth daily. For high blood pressure Patient not taking: Reported on 09/08/2017 05/27/17   Money, Lowry Ram, FNP  ARIPiprazole (ABILIFY) 15 MG tablet Take 1 tablet (15 mg total) by mouth daily. For mood control Patient not taking: Reported  on 09/08/2017 05/27/17   Money, Lowry Ram, FNP  ARIPiprazole ER 300 MG SRER Inject 300 mg into the muscle every 28 (twenty-eight) days. For mood control 06/21/17   Money, Darnelle Maffucci B, FNP  cloNIDine (CATAPRES) 0.1 MG tablet Take 1 tablet (0.1 mg total) by mouth 3 (three) times daily. For high blood pressure Patient not taking: Reported on 09/08/2017 05/26/17   Money, Lowry Ram, FNP  ferrous sulfate 325 (65 FE) MG tablet Take 1 tablet (325 mg  total) by mouth 2 (two) times daily with a meal. Patient not taking: Reported on 09/08/2017 05/26/17   Money, Lowry Ram, FNP  fluticasone (FLONASE) 50 MCG/ACT nasal spray Place 1 spray into both nostrils 2 (two) times daily. For allergies Patient not taking: Reported on 09/08/2017 05/26/17   Money, Lowry Ram, FNP  guaiFENesin (MUCINEX) 600 MG 12 hr tablet Take 2 tablets (1,200 mg total) by mouth 2 (two) times daily as needed for cough or to loosen phlegm. Patient not taking: Reported on 09/08/2017 05/26/17   Money, Lowry Ram, FNP  hydrALAZINE (APRESOLINE) 25 MG tablet Take 1 tablet (25 mg total) by mouth every 8 (eight) hours. For high blood pressure Patient not taking: Reported on 09/08/2017 05/26/17   Money, Lowry Ram, FNP  hydrOXYzine (ATARAX/VISTARIL) 25 MG tablet Take 1 tablet (25 mg total) by mouth 3 (three) times daily as needed for anxiety. Patient not taking: Reported on 09/08/2017 05/26/17   Money, Lowry Ram, FNP  insulin glargine (LANTUS) 100 UNIT/ML injection Inject 0.13 mLs (13 Units total) into the skin daily. For diabetes 05/27/17   Money, Lowry Ram, FNP  lisinopril (PRINIVIL,ZESTRIL) 40 MG tablet Take 1 tablet (40 mg total) by mouth daily. For high blood pressure Patient not taking: Reported on 09/08/2017 05/27/17   Money, Lowry Ram, FNP  mirtazapine (REMERON) 15 MG tablet Take 1 tablet (15 mg total) by mouth at bedtime. For mood control Patient not taking: Reported on 09/08/2017 05/26/17   Money, Lowry Ram, FNP  pantoprazole (PROTONIX) 40 MG tablet Take 1 tablet (40 mg total) by mouth daily. For acid reflux Patient not taking: Reported on 09/08/2017 05/27/17   Money, Lowry Ram, FNP  sertraline (ZOLOFT) 100 MG tablet Take 1 tablet (100 mg total) by mouth daily. For mood control Patient not taking: Reported on 09/08/2017 05/27/17   Money, Lowry Ram, FNP  traZODone (DESYREL) 50 MG tablet Take 1 tablet (50 mg total) by mouth at bedtime as needed for sleep. Patient not taking: Reported on 09/08/2017 05/26/17    Money, Lowry Ram, FNP    Family History Family History  Problem Relation Age of Onset  . Stroke Brother 59  . Stroke Brother 41  . Heart attack Mother 67  . Stroke Father 73    Social History Social History   Tobacco Use  . Smoking status: Current Some Day Smoker    Packs/day: 0.00    Years: 10.00    Pack years: 0.00    Types: Cigarettes    Last attempt to quit: 12/07/2014    Years since quitting: 2.7  . Smokeless tobacco: Never Used  . Tobacco comment: smoke a cigarette a day   Substance Use Topics  . Alcohol use: No    Alcohol/week: 0.0 oz    Comment: rare  . Drug use: Yes    Types: Marijuana, "Crack" cocaine     Allergies   Morphine and related   Review of Systems Review of Systems  A complete review of systems was obtained and  all systems are negative except as noted in the HPI and PMH.   Physical Exam Updated Vital Signs BP (!) 186/108 (BP Location: Right Arm)   Pulse 86   Temp 98.4 F (36.9 C) (Oral)   Resp 15   Ht 5\' 1"  (1.549 m)   Wt 103 kg (227 lb)   SpO2 100%   BMI 42.89 kg/m   Physical Exam  Constitutional: She is oriented to person, place, and time. She appears well-developed and well-nourished. No distress.  HENT:  Head: Normocephalic and atraumatic.  Mouth/Throat: Oropharynx is clear and moist.  Eyes: Conjunctivae and EOM are normal. Pupils are equal, round, and reactive to light.  Neck: Normal range of motion.  Cardiovascular: Normal rate, regular rhythm and intact distal pulses.  Pulmonary/Chest: Effort normal and breath sounds normal.  Abdominal: Soft. There is no tenderness.  Musculoskeletal: Normal range of motion.  Neurological: She is alert and oriented to person, place, and time.  Skin: She is not diaphoretic.  Psychiatric:  Organized, does not appear to be responding to internal stimuli, slow to respond  Nursing note and vitals reviewed.    ED Treatments / Results  Labs (all labs ordered are listed, but only abnormal  results are displayed) Labs Reviewed  BASIC METABOLIC PANEL - Abnormal; Notable for the following components:      Result Value   Chloride 100 (*)    Glucose, Bld 231 (*)    Creatinine, Ser 1.33 (*)    GFR calc non Af Amer 47 (*)    GFR calc Af Amer 55 (*)    All other components within normal limits  CBC - Abnormal; Notable for the following components:   RBC 5.42 (*)    MCV 71.8 (*)    MCH 23.2 (*)    All other components within normal limits  ACETAMINOPHEN LEVEL - Abnormal; Notable for the following components:   Acetaminophen (Tylenol), Serum <10 (*)    All other components within normal limits  RAPID URINE DRUG SCREEN, HOSP PERFORMED - Abnormal; Notable for the following components:   Cocaine POSITIVE (*)    Tetrahydrocannabinol POSITIVE (*)    All other components within normal limits  ETHANOL  SALICYLATE LEVEL  I-STAT TROPONIN, ED  I-STAT BETA HCG BLOOD, ED ([facility], [facility], [facility] ONLY)    EKG  EKG Interpretation  Date/Time:  Saturday September 08 2017 12:54:29 EST Ventricular Rate:  95 PR Interval:  170 QRS Duration: 82 QT Interval:  382 QTC Calculation: 480 R Axis:   25 Text Interpretation:  Normal sinus rhythm Possible Left atrial enlargement Possible Anterior infarct , age undetermined Abnormal ECG Confirmed by Virgel Manifold [phone]) on 09/08/2017 2:35:29 PM       Radiology Dg Chest 2 View  Result Date: 09/08/2017 CLINICAL DATA:  Chest pain EXAM: CHEST  2 VIEW COMPARISON:  05/26/2017 FINDINGS: Lingular scarring. Heart is borderline in size. Right lung is clear. No effusions or acute bony abnormality. IMPRESSION: Lingular scarring.  Borderline heart size. Electronically Signed   By: Rolm Baptise M.D.   On: 09/08/2017 13:45    Procedures Procedures (including critical care time)  Medications Ordered in ED Medications  amLODipine (NORVASC) tablet 10 mg (not administered)  ferrous sulfate tablet 325 mg (not administered)  ARIPiprazole (ABILIFY) tablet 15 mg (not  administered)  cloNIDine (CATAPRES) tablet 0.1 mg (not administered)  fluticasone (FLONASE) 50 MCG/ACT nasal spray 1 spray (not administered)  hydrALAZINE (APRESOLINE) tablet 25 mg (not administered)  hydrOXYzine (ATARAX/VISTARIL) tablet 25 mg (  not administered)  lisinopril (PRINIVIL,ZESTRIL) tablet 40 mg (not administered)  mirtazapine (REMERON) tablet 15 mg (not administered)  pantoprazole (PROTONIX) EC tablet 40 mg (not administered)  sertraline (ZOLOFT) tablet 100 mg (not administered)  traZODone (DESYREL) tablet 50 mg (not administered)  nicotine (NICODERM CQ - dosed in mg/24 hours) patch 21 mg (not administered)  alum & mag hydroxide-simeth (MAALOX/MYLANTA) 200-200-20 MG/5ML suspension 30 mL (not administered)  ondansetron (ZOFRAN) tablet 4 mg (not administered)  ibuprofen (ADVIL,MOTRIN) tablet 600 mg (not administered)     Initial Impression / Assessment and Plan / ED Course  I have reviewed the triage vital signs and the nursing notes.  Pertinent labs & imaging results that were available during my care of the patient were reviewed by me and considered in my medical decision making (see chart for details).     Vitals:   09/08/17 1427 09/08/17 1430 09/08/17 1500 09/08/17 1530  BP: (!) 182/111 (!) 185/111 (!) 212/121 (!) 186/108  Pulse: 79 84  86  Resp: (!) 23 16 18 15   Temp: 98.4 F (36.9 C)     TempSrc: Oral     SpO2: 97% 100% 100% 100%  Weight:      Height:        Medications  amLODipine (NORVASC) tablet 10 mg (not administered)  ferrous sulfate tablet 325 mg (not administered)  ARIPiprazole (ABILIFY) tablet 15 mg (not administered)  cloNIDine (CATAPRES) tablet 0.1 mg (not administered)  fluticasone (FLONASE) 50 MCG/ACT nasal spray 1 spray (not administered)  hydrALAZINE (APRESOLINE) tablet 25 mg (not administered)  hydrOXYzine (ATARAX/VISTARIL) tablet 25 mg (not administered)  lisinopril (PRINIVIL,ZESTRIL) tablet 40 mg (not administered)  mirtazapine (REMERON)  tablet 15 mg (not administered)  pantoprazole (PROTONIX) EC tablet 40 mg (not administered)  sertraline (ZOLOFT) tablet 100 mg (not administered)  traZODone (DESYREL) tablet 50 mg (not administered)  nicotine (NICODERM CQ - dosed in mg/24 hours) patch 21 mg (not administered)  alum & mag hydroxide-simeth (MAALOX/MYLANTA) 200-200-20 MG/5ML suspension 30 mL (not administered)  ondansetron (ZOFRAN) tablet 4 mg (not administered)  ibuprofen (ADVIL,MOTRIN) tablet 600 mg (not administered)    Diamond Rice is 47 y.o. female presenting with episode of resolved chest pain with persistent shortness of breath onset yesterday, lung sounds clear to auscultation, no tachypnea or tachycardia, doubt PE.  Patient with multiple cardiac risk factors however, EKG is unchanged, troponin negative, patient remains chest pain-free.  Blood work reassuring.  Home meds ordered.   Patient is medically cleared for psychiatric evaluation will be transferred to the psych ED. TTS consulted, home meds and psych standard holding orders placed.    Final Clinical Impressions(s) / ED Diagnoses   Final diagnoses:  Suicidal ideations  Chest pain, unspecified type    ED Discharge Orders    None       Karen Kays Charna Elizabeth 09/08/17 1557    Virgel Manifold, MD 09/09/17 0710

## 2017-09-08 NOTE — ED Notes (Signed)
Pt states she has not taken any meds for approx 3 months

## 2017-09-08 NOTE — ED Notes (Signed)
Pt belongings inventoried; 2 bags in locker #5. Belongings envelope sent to security.

## 2017-09-09 LAB — CBG MONITORING, ED
GLUCOSE-CAPILLARY: 262 mg/dL — AB (ref 65–99)
GLUCOSE-CAPILLARY: 274 mg/dL — AB (ref 65–99)
Glucose-Capillary: 184 mg/dL — ABNORMAL HIGH (ref 65–99)

## 2017-09-09 MED ORDER — CLONIDINE HCL 0.2 MG PO TABS
0.2000 mg | ORAL_TABLET | Freq: Once | ORAL | Status: AC
  Administered 2017-09-09: 0.2 mg via ORAL
  Filled 2017-09-09: qty 1

## 2017-09-09 MED ORDER — INSULIN ASPART 100 UNIT/ML ~~LOC~~ SOLN
6.0000 [IU] | Freq: Once | SUBCUTANEOUS | Status: AC
  Administered 2017-09-09: 6 [IU] via SUBCUTANEOUS
  Filled 2017-09-09: qty 1

## 2017-09-09 NOTE — Progress Notes (Signed)
Pt has been tentatively accepted to [facility] pending CBG reading below 200. Becky RN notified of this and has been provided with admissions contact information: [phone].  Maxie Better, MSW, LCSW Clinical Social Worker 09/09/2017 2:05 PM

## 2017-09-09 NOTE — ED Notes (Signed)
Breakfast at pt's bedside

## 2017-09-09 NOTE — ED Notes (Signed)
Spoke w/[place] - advised they may accept pt if CBG and BP are in normal range.

## 2017-09-09 NOTE — Progress Notes (Signed)
Pt has been accepted to [facility]. Admitting MD: Dr. Enzo Bi. No room assignment as of now. Number for report: [id number]. Becky RN notified of disposition.  Maxie Better, MSW, LCSW Clinical Social Worker 09/09/2017 3:13 PM

## 2017-09-09 NOTE — Progress Notes (Signed)
CSW received phone call that patient has been accepted to [facility] for today 1/13.   Accepting doctor: Enzo Bi  RM #: N/A # for report: [phone]  Facility can accept patient at any time today.   Kingsley Spittle, [facility] Emergency Room Clinical Social Worker [phone]

## 2017-09-09 NOTE — Progress Notes (Signed)
Patient meets criteria for inpatient treatment. Per Letitia Libra AC, Pt is not appropriate for [facility]. CSW faxed referrals to the following facilities for review:  [place], [place], [place], [employer], [place], [place], [place].   TTS will continue to seek bed placement.   Maxie Better, MSW, LCSW Clinical Social Worker 09/09/2017 10:59 AM

## 2017-09-09 NOTE — ED Notes (Signed)
Ordered Breakfast Tray  

## 2017-09-09 NOTE — ED Notes (Signed)
Re-TTS being performed.  

## 2017-09-09 NOTE — ED Notes (Signed)
Diamond Rice, aware pt's CBG 184 and BP 121/88. Per Nira Conn, [facility] - pt accepted by Dr Abbey Chatters. Call report [phone].

## 2017-09-09 NOTE — ED Notes (Signed)
Dr Venora Maples aware pt's CBG 274. Order received to administer Novolog 6 units now and recheck.

## 2017-09-09 NOTE — ED Notes (Addendum)
Woke pt - advised of tx plan - [facility] possibly - depending upon CBG and BP results. Pt took meds w/o difficulty and voiced understanding to drink po fluids given - water x 2 cups, tea w/lunch, and Caff-Free Coke as requested - agreement w/tx plan.

## 2017-09-09 NOTE — BH Assessment (Signed)
[facility] Assessment Progress Note   Clinician reassessed pt this morning. Pt continues to endorse suicidal thoughts. She denies having a plan while in the ED. She denies HI and AH. She reports visual hallucinations. She states she does not know what they are but she sees things sometimes. Last time she experienced this was a few days ago. Pt continues to meet inpatient criteria. TTS will seek placement.   [place] MSW, LCSW  09/09/2017 9:59 AM

## 2017-09-09 NOTE — ED Notes (Addendum)
Pt noted w/foul body odor. Encouraged pt to shower x 2. States she will later. Pt given snack as requested.

## 2018-01-10 ENCOUNTER — Encounter (HOSPITAL_COMMUNITY): Payer: Self-pay

## 2018-01-10 ENCOUNTER — Ambulatory Visit (HOSPITAL_COMMUNITY)
Admission: RE | Admit: 2018-01-10 | Discharge: 2018-01-10 | Disposition: A | Discharge status: Home / Self Care | Payer: Medicaid Other | Attending: Psychiatry | Admitting: Psychiatry

## 2018-01-10 ENCOUNTER — Other Ambulatory Visit: Payer: Self-pay

## 2018-01-10 ENCOUNTER — Emergency Department (HOSPITAL_COMMUNITY)
Admission: EM | Admit: 2018-01-10 | Discharge: 2018-01-11 | Disposition: A | Discharge status: Home / Self Care | Payer: Medicaid Other | Attending: Emergency Medicine | Admitting: Emergency Medicine

## 2018-01-10 DIAGNOSIS — Z8673 Personal history of transient ischemic attack (TIA), and cerebral infarction without residual deficits: Secondary | ICD-10-CM | POA: Insufficient documentation

## 2018-01-10 DIAGNOSIS — F319 Bipolar disorder, unspecified: Secondary | ICD-10-CM | POA: Insufficient documentation

## 2018-01-10 DIAGNOSIS — F250 Schizoaffective disorder, bipolar type: Secondary | ICD-10-CM

## 2018-01-10 DIAGNOSIS — E669 Obesity, unspecified: Secondary | ICD-10-CM | POA: Diagnosis not present

## 2018-01-10 DIAGNOSIS — J449 Chronic obstructive pulmonary disease, unspecified: Secondary | ICD-10-CM | POA: Insufficient documentation

## 2018-01-10 DIAGNOSIS — I10 Essential (primary) hypertension: Secondary | ICD-10-CM | POA: Diagnosis not present

## 2018-01-10 DIAGNOSIS — E119 Type 2 diabetes mellitus without complications: Secondary | ICD-10-CM | POA: Insufficient documentation

## 2018-01-10 DIAGNOSIS — F17210 Nicotine dependence, cigarettes, uncomplicated: Secondary | ICD-10-CM | POA: Insufficient documentation

## 2018-01-10 DIAGNOSIS — Z79899 Other long term (current) drug therapy: Secondary | ICD-10-CM | POA: Insufficient documentation

## 2018-01-10 DIAGNOSIS — F1210 Cannabis abuse, uncomplicated: Secondary | ICD-10-CM | POA: Diagnosis not present

## 2018-01-10 DIAGNOSIS — F259 Schizoaffective disorder, unspecified: Secondary | ICD-10-CM | POA: Insufficient documentation

## 2018-01-10 DIAGNOSIS — Z794 Long term (current) use of insulin: Secondary | ICD-10-CM | POA: Insufficient documentation

## 2018-01-10 DIAGNOSIS — R45851 Suicidal ideations: Secondary | ICD-10-CM | POA: Insufficient documentation

## 2018-01-10 LAB — CBC
HEMATOCRIT: 34.6 % — AB (ref 36.0–46.0)
Hemoglobin: 11.2 g/dL — ABNORMAL LOW (ref 12.0–15.0)
MCH: 23.2 pg — AB (ref 26.0–34.0)
MCHC: 32.4 g/dL (ref 30.0–36.0)
MCV: 71.6 fL — AB (ref 78.0–100.0)
Platelets: 326 10*3/uL (ref 150–400)
RBC: 4.83 MIL/uL (ref 3.87–5.11)
RDW: 16.3 % — ABNORMAL HIGH (ref 11.5–15.5)
WBC: 9.7 10*3/uL (ref 4.0–10.5)

## 2018-01-10 LAB — COMPREHENSIVE METABOLIC PANEL
ALBUMIN: 3.4 g/dL — AB (ref 3.5–5.0)
ALT: 9 U/L — AB (ref 14–54)
AST: 11 U/L — AB (ref 15–41)
Alkaline Phosphatase: 72 U/L (ref 38–126)
Anion gap: 9 (ref 5–15)
BILIRUBIN TOTAL: 0.3 mg/dL (ref 0.3–1.2)
BUN: 10 mg/dL (ref 6–20)
CHLORIDE: 103 mmol/L (ref 101–111)
CO2: 26 mmol/L (ref 22–32)
CREATININE: 1.04 mg/dL — AB (ref 0.44–1.00)
Calcium: 9.1 mg/dL (ref 8.9–10.3)
GFR calc Af Amer: >60 mL/min (ref >60)
GLUCOSE: 208 mg/dL — AB (ref 65–99)
Potassium: 3.2 mmol/L — ABNORMAL LOW (ref 3.5–5.1)
SODIUM: 138 mmol/L (ref 135–145)
TOTAL PROTEIN: 7.5 g/dL (ref 6.5–8.1)

## 2018-01-10 LAB — I-STAT BETA HCG BLOOD, ED (MC, WL, AP ONLY): I-stat hCG, quantitative: <5 m[IU]/mL (ref <5)

## 2018-01-10 LAB — ACETAMINOPHEN LEVEL: Acetaminophen (Tylenol), Serum: <10 ug/mL — ABNORMAL LOW (ref 10–30)

## 2018-01-10 LAB — ETHANOL: Alcohol, Ethyl (B): <10 mg/dL (ref <10)

## 2018-01-10 LAB — CBG MONITORING, ED: Glucose-Capillary: 237 mg/dL — ABNORMAL HIGH (ref 65–99)

## 2018-01-10 LAB — SALICYLATE LEVEL: Salicylate Lvl: <7 mg/dL (ref 2.8–30.0)

## 2018-01-10 MED ORDER — LISINOPRIL 20 MG PO TABS
40.0000 mg | ORAL_TABLET | Freq: Every day | ORAL | Status: DC
  Administered 2018-01-11: 40 mg via ORAL
  Filled 2018-01-10: qty 2

## 2018-01-10 MED ORDER — PROCHLORPERAZINE EDISYLATE 10 MG/2ML IJ SOLN
10.0000 mg | Freq: Once | INTRAMUSCULAR | Status: AC
  Administered 2018-01-10: 10 mg via INTRAMUSCULAR
  Filled 2018-01-10: qty 2

## 2018-01-10 MED ORDER — ARIPIPRAZOLE 5 MG PO TABS
15.0000 mg | ORAL_TABLET | Freq: Every day | ORAL | Status: DC
  Administered 2018-01-11: 15 mg via ORAL
  Filled 2018-01-10: qty 1

## 2018-01-10 MED ORDER — HYDRALAZINE HCL 25 MG PO TABS
25.0000 mg | ORAL_TABLET | Freq: Three times a day (TID) | ORAL | Status: DC
Start: 2018-01-10 — End: 2018-01-11
  Administered 2018-01-10 – 2018-01-11 (×3): 25 mg via ORAL
  Filled 2018-01-10 (×4): qty 1

## 2018-01-10 MED ORDER — ALBUTEROL SULFATE HFA 108 (90 BASE) MCG/ACT IN AERS: 2.0000 | INHALATION_SPRAY | RESPIRATORY_TRACT | Status: DC | PRN

## 2018-01-10 MED ORDER — AMLODIPINE BESYLATE 5 MG PO TABS
10.0000 mg | ORAL_TABLET | ORAL | Status: AC
  Administered 2018-01-10: 10 mg via ORAL
  Filled 2018-01-10: qty 2

## 2018-01-10 MED ORDER — LISINOPRIL 20 MG PO TABS
40.0000 mg | ORAL_TABLET | ORAL | Status: AC
  Administered 2018-01-10: 40 mg via ORAL
  Filled 2018-01-10: qty 2

## 2018-01-10 MED ORDER — CLONIDINE HCL 0.1 MG PO TABS
0.2000 mg | ORAL_TABLET | ORAL | Status: AC
  Administered 2018-01-10: 0.2 mg via ORAL
  Filled 2018-01-10: qty 2

## 2018-01-10 MED ORDER — INSULIN DETEMIR 100 UNIT/ML ~~LOC~~ SOLN
15.0000 [IU] | Freq: Every day | SUBCUTANEOUS | Status: DC
  Administered 2018-01-10: 15 [IU] via SUBCUTANEOUS
  Filled 2018-01-10 (×2): qty 0.15

## 2018-01-10 MED ORDER — MIRTAZAPINE 30 MG PO TABS
15.0000 mg | ORAL_TABLET | Freq: Every day | ORAL | Status: DC
  Administered 2018-01-10: 15 mg via ORAL
  Filled 2018-01-10: qty 1

## 2018-01-10 MED ORDER — AMLODIPINE BESYLATE 5 MG PO TABS
10.0000 mg | ORAL_TABLET | Freq: Every day | ORAL | Status: DC
  Administered 2018-01-11: 10 mg via ORAL
  Filled 2018-01-10: qty 2

## 2018-01-10 MED ORDER — DIPHENHYDRAMINE HCL 50 MG/ML IJ SOLN
25.0000 mg | Freq: Once | INTRAMUSCULAR | Status: AC
  Administered 2018-01-10: 25 mg via INTRAMUSCULAR
  Filled 2018-01-10: qty 1

## 2018-01-10 MED ORDER — PANTOPRAZOLE SODIUM 40 MG PO TBEC
40.0000 mg | DELAYED_RELEASE_TABLET | Freq: Every day | ORAL | Status: DC
  Administered 2018-01-11: 40 mg via ORAL
  Filled 2018-01-10: qty 1

## 2018-01-10 MED ORDER — CLONIDINE HCL 0.1 MG PO TABS
0.2000 mg | ORAL_TABLET | Freq: Three times a day (TID) | ORAL | Status: DC
  Administered 2018-01-11 (×2): 0.2 mg via ORAL
  Filled 2018-01-10 (×2): qty 2

## 2018-01-10 NOTE — ED Notes (Signed)
Bed: [facility]PT4 Expected date:  Expected time:  Means of arrival:  Comments: 
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
## 2018-01-10 NOTE — ED Triage Notes (Signed)
Pt reports a hx of hypertension. She is presenting form [facility] because of a headache. She endorses some blurry vision. Denies any weakness, tingling, or mental status changes. A&Ox4. Mental Health Tech at bedside.

## 2018-01-10 NOTE — ED Provider Notes (Signed)
[place] DEPT Provider Note   CSN: [id number] Arrival date & time: 01/10/18  1757     History   Chief Complaint Chief Complaint  Patient presents with  . Hypertension    HPI Diamond Rice is a 47 y.o. female.  47 yo F with a chief complaint of hypertension.  The patient checked into [facility] because of worsening suicidality.  While there was noted that she had high blood pressure.  Patient had not taken her medication today.  While waiting to be seen here she started complaining of a right frontal headache.  This is dull in nature slow in onset.  Feels like her prior headaches.  Denies trauma denies nausea or vomiting.  Patient denies any new neurologic deficit.  Denies chest pain or trouble breathing.  The history is provided by the patient.  Illness  This is a new problem. The current episode started 6 to 12 hours ago. The problem occurs constantly. The problem has not changed since onset.Associated symptoms include headaches (right frontal headache). Pertinent negatives include no chest pain, no abdominal pain and no shortness of breath. Nothing aggravates the symptoms. Nothing relieves the symptoms. She has tried nothing for the symptoms. The treatment provided no relief.    Past Medical History:  Diagnosis Date  . Anginal pain ([place])   . Anxiety   . Chronic lower back pain 04/17/2012   "just got over some; catched when I walked"  . COPD (chronic obstructive pulmonary disease) ([place])   . Depression   . Headache(784.0) 04/17/2012   "~ qod; lately waking up in am w/one"  . Heart attack ([place])   . High cholesterol   . Hypertension   . Migraines 04/17/2012  . Obesity   . Shortness of breath 04/17/2012   "all the time"  . Sleep apnea   . Stroke ([place])   . Type II diabetes mellitus ([place]) 08/28/2002    Patient Active Problem List   Diagnosis Date Noted  . Cannabis use disorder, moderate, dependence ([place]) 05/19/2017  . Cocaine use disorder,  moderate, dependence ([place]) 05/19/2017  . Schizoaffective disorder ([place]) 10/07/2015  . OSA (obstructive sleep apnea) 06/09/2015  . Vitamin D deficiency 01/06/2015  . Bilateral knee pain 01/05/2015  . Numbness and tingling in right hand 01/05/2015  . Insomnia 12/07/2014  . Right-sided lacunar infarction ([place]) 09/15/2014  . Left hemiparesis ([place]) 09/15/2014  . Dyspnea   . Back pain 09/11/2013  . Psychoactive substance-induced organic mood disorder ([place]) 05/28/2013  . Cocaine abuse with cocaine-induced mood disorder ([place]) 05/28/2013  . Cannabis dependence with cannabis-induced anxiety disorder ([place]) 05/28/2013  . Morbid obesity with BMI of 45.0-49.9, adult ([place]) 04/25/2012  . Chest pain 04/17/2012  . Hypertension 04/17/2012  . Diabetes mellitus type 2 with complications, uncontrolled ([place]) 04/17/2012  . Hyperlipidemia 04/17/2012  . Tobacco use 04/17/2012    Past Surgical History:  Procedure Laterality Date  . CARDIAC CATHETERIZATION  ~ 2011     OB History   None      Home Medications    Prior to Admission medications   Medication Sig Start Date End Date Taking? Authorizing Provider  amLODipine (NORVASC) 10 MG tablet Take 10 mg by mouth daily.   Yes [provider]  ARIPiprazole (ABILIFY) 15 MG tablet Take 15 mg by mouth daily.   Yes [provider]  cloNIDine (CATAPRES) 0.2 MG tablet Take 0.2 mg by mouth 3 (three) times daily.   Yes [provider]  Insulin Detemir (  LEVEMIR) 100 UNIT/ML Pen Inject 15 Units into the skin daily at 10 pm.   Yes [provider]  lisinopril (PRINIVIL,ZESTRIL) 40 MG tablet Take 40 mg by mouth daily.   Yes [provider]  mirtazapine (REMERON) 15 MG tablet Take 15 mg by mouth at bedtime.   Yes [provider]  pantoprazole (PROTONIX) 40 MG tablet Take 40 mg by mouth daily.   Yes [provider]  albuterol (PROVENTIL HFA;VENTOLIN HFA) 108 (90 Base) MCG/ACT inhaler Inhale 2 puffs into the  lungs every 4 (four) hours as needed for wheezing or shortness of breath. Patient not taking: Reported on 01/10/2018 05/26/17   Money, Lowry Ram, FNP  amLODipine (NORVASC) 10 MG tablet Take 1 tablet (10 mg total) by mouth daily. For high blood pressure Patient not taking: Reported on 09/08/2017 05/27/17   Money, Lowry Ram, FNP  ARIPiprazole (ABILIFY) 15 MG tablet Take 1 tablet (15 mg total) by mouth daily. For mood control Patient not taking: Reported on 09/08/2017 05/27/17   Money, Lowry Ram, FNP  ARIPiprazole ER 300 MG SRER Inject 300 mg into the muscle every 28 (twenty-eight) days. For mood control Patient not taking: Reported on 01/10/2018 06/21/17   Money, Lowry Ram, FNP  cloNIDine (CATAPRES) 0.1 MG tablet Take 1 tablet (0.1 mg total) by mouth 3 (three) times daily. For high blood pressure Patient not taking: Reported on 09/08/2017 05/26/17   Money, Lowry Ram, FNP  ferrous sulfate 325 (65 FE) MG tablet Take 1 tablet (325 mg total) by mouth 2 (two) times daily with a meal. Patient not taking: Reported on 09/08/2017 05/26/17   Money, Lowry Ram, FNP  fluticasone (FLONASE) 50 MCG/ACT nasal spray Place 1 spray into both nostrils 2 (two) times daily. For allergies Patient not taking: Reported on 09/08/2017 05/26/17   Money, Lowry Ram, FNP  guaiFENesin (MUCINEX) 600 MG 12 hr tablet Take 2 tablets (1,200 mg total) by mouth 2 (two) times daily as needed for cough or to loosen phlegm. Patient not taking: Reported on 09/08/2017 05/26/17   Money, Lowry Ram, FNP  hydrALAZINE (APRESOLINE) 25 MG tablet Take 1 tablet (25 mg total) by mouth every 8 (eight) hours. For high blood pressure Patient not taking: Reported on 09/08/2017 05/26/17   Money, Lowry Ram, FNP  hydrOXYzine (ATARAX/VISTARIL) 25 MG tablet Take 1 tablet (25 mg total) by mouth 3 (three) times daily as needed for anxiety. Patient not taking: Reported on 09/08/2017 05/26/17   Money, Lowry Ram, FNP  insulin glargine (LANTUS) 100 UNIT/ML injection Inject 0.13 mLs (13 Units  total) into the skin daily. For diabetes Patient not taking: Reported on 01/10/2018 05/27/17   Money, Lowry Ram, FNP  lisinopril (PRINIVIL,ZESTRIL) 40 MG tablet Take 1 tablet (40 mg total) by mouth daily. For high blood pressure Patient not taking: Reported on 01/10/2018 05/27/17   Money, Lowry Ram, FNP  mirtazapine (REMERON) 15 MG tablet Take 1 tablet (15 mg total) by mouth at bedtime. For mood control Patient not taking: Reported on 09/08/2017 05/26/17   Money, Lowry Ram, FNP  pantoprazole (PROTONIX) 40 MG tablet Take 1 tablet (40 mg total) by mouth daily. For acid reflux Patient not taking: Reported on 09/08/2017 05/27/17   Money, Lowry Ram, FNP  sertraline (ZOLOFT) 100 MG tablet Take 1 tablet (100 mg total) by mouth daily. For mood control Patient not taking: Reported on 09/08/2017 05/27/17   Money, Lowry Ram, FNP  traZODone (DESYREL) 50 MG tablet Take 1 tablet (50 mg total) by mouth at  bedtime as needed for sleep. Patient not taking: Reported on 09/08/2017 05/26/17   Money, Lowry Ram, FNP    Family History Family History  Problem Relation Age of Onset  . Stroke Brother 42  . Stroke Brother 72  . Heart attack Mother 66  . Stroke Father 65    Social History Social History   Tobacco Use  . Smoking status: Current Some Day Smoker    Packs/day: 0.00    Years: 10.00    Pack years: 0.00    Types: Cigarettes    Last attempt to quit: 12/07/2014    Years since quitting: 3.0  . Smokeless tobacco: Never Used  . Tobacco comment: smoke a cigarette a day   Substance Use Topics  . Alcohol use: No    Alcohol/week: 0.0 oz    Comment: rare  . Drug use: Yes    Types: Marijuana, "Crack" cocaine     Allergies   Morphine and related   Review of Systems Review of Systems  Constitutional: Negative for chills and fever.  HENT: Negative for congestion and rhinorrhea.   Eyes: Negative for redness and visual disturbance.  Respiratory: Negative for shortness of breath and wheezing.   Cardiovascular:  Negative for chest pain and palpitations.  Gastrointestinal: Negative for abdominal pain, nausea and vomiting.  Genitourinary: Negative for dysuria and urgency.  Musculoskeletal: Negative for arthralgias and myalgias.  Skin: Negative for pallor and wound.  Neurological: Positive for headaches (right frontal headache). Negative for dizziness.     Physical Exam Updated Vital Signs BP (!) 197/111   Pulse 77   Temp 98.5 F (36.9 C) (Oral)   Resp 18   SpO2 100%   Physical Exam  Constitutional: She is oriented to person, place, and time. She appears well-developed and well-nourished. No distress.  HENT:  Head: Normocephalic and atraumatic.  Eyes: Pupils are equal, round, and reactive to light. EOM are normal.  Neck: Normal range of motion. Neck supple.  Cardiovascular: Normal rate and regular rhythm. Exam reveals no gallop and no friction rub.  No murmur heard. Pulmonary/Chest: Effort normal. She has no wheezes. She has no rales.  Abdominal: Soft. She exhibits no distension. There is no tenderness.  Musculoskeletal: She exhibits no edema or tenderness.  Neurological: She is alert and oriented to person, place, and time.  Left sided facial droop, left upper and left lower extremity weakness.   Skin: Skin is warm and dry. She is not diaphoretic.  Psychiatric: She has a normal mood and affect. Her behavior is normal.  Nursing note and vitals reviewed.    ED Treatments / Results  Labs (all labs ordered are listed, but only abnormal results are displayed) Labs Reviewed  COMPREHENSIVE METABOLIC PANEL  ETHANOL  SALICYLATE LEVEL  ACETAMINOPHEN LEVEL  CBC  RAPID URINE DRUG SCREEN, HOSP PERFORMED  I-STAT BETA HCG BLOOD, ED ([facility], [facility], [facility] ONLY)    EKG None  Radiology No results found.  Procedures Procedures (including critical care time)  Medications Ordered in ED Medications  albuterol (PROVENTIL HFA;VENTOLIN HFA) 108 (90 Base) MCG/ACT inhaler 2 puff (has no administration  in time range)  hydrALAZINE (APRESOLINE) tablet 25 mg (has no administration in time range)  amLODipine (NORVASC) tablet 10 mg (has no administration in time range)  ARIPiprazole (ABILIFY) tablet 15 mg (has no administration in time range)  cloNIDine (CATAPRES) tablet 0.2 mg (has no administration in time range)  Insulin Detemir (LEVEMIR) FlexPen 15 Units (has no administration in time range)  lisinopril (PRINIVIL,ZESTRIL)  tablet 40 mg (has no administration in time range)  mirtazapine (REMERON) tablet 15 mg (has no administration in time range)  pantoprazole (PROTONIX) EC tablet 40 mg (has no administration in time range)  amLODipine (NORVASC) tablet 10 mg (10 mg Oral Given 01/10/18 2057)  lisinopril (PRINIVIL,ZESTRIL) tablet 40 mg (40 mg Oral Given 01/10/18 2057)  cloNIDine (CATAPRES) tablet 0.2 mg (0.2 mg Oral Given 01/10/18 2057)  prochlorperazine (COMPAZINE) injection 10 mg (10 mg Intramuscular Given 01/10/18 2057)  diphenhydrAMINE (BENADRYL) injection 25 mg (25 mg Intramuscular Given 01/10/18 2058)     Initial Impression / Assessment and Plan / ED Course  I have reviewed the triage vital signs and the nursing notes.  Pertinent labs & imaging results that were available during my care of the patient were reviewed by me and considered in my medical decision making (see chart for details).     47 yo F with a chief complaint of high blood pressure.  Patient did not take her medications this morning.  She is also suicidal.  She is been accepted to TTS but they no longer have a bed available for her.  We will place her in psych hold.  I feel she is medically clear.  No new neuro deficits, has had a prior stroke. Started her back on her home blood pressure medications.  The patients results and plan were reviewed and discussed.   Any x-rays performed were independently reviewed by myself.   Differential diagnosis were considered with the presenting HPI.  Medications  albuterol (PROVENTIL  HFA;VENTOLIN HFA) 108 (90 Base) MCG/ACT inhaler 2 puff (has no administration in time range)  hydrALAZINE (APRESOLINE) tablet 25 mg (has no administration in time range)  amLODipine (NORVASC) tablet 10 mg (has no administration in time range)  ARIPiprazole (ABILIFY) tablet 15 mg (has no administration in time range)  cloNIDine (CATAPRES) tablet 0.2 mg (has no administration in time range)  Insulin Detemir (LEVEMIR) FlexPen 15 Units (has no administration in time range)  lisinopril (PRINIVIL,ZESTRIL) tablet 40 mg (has no administration in time range)  mirtazapine (REMERON) tablet 15 mg (has no administration in time range)  pantoprazole (PROTONIX) EC tablet 40 mg (has no administration in time range)  amLODipine (NORVASC) tablet 10 mg (10 mg Oral Given 01/10/18 2057)  lisinopril (PRINIVIL,ZESTRIL) tablet 40 mg (40 mg Oral Given 01/10/18 2057)  cloNIDine (CATAPRES) tablet 0.2 mg (0.2 mg Oral Given 01/10/18 2057)  prochlorperazine (COMPAZINE) injection 10 mg (10 mg Intramuscular Given 01/10/18 2057)  diphenhydrAMINE (BENADRYL) injection 25 mg (25 mg Intramuscular Given 01/10/18 2058)    Vitals:   01/10/18 1833  BP: (!) 197/111  Pulse: 77  Resp: 18  Temp: 98.5 F (36.9 C)  TempSrc: Oral  SpO2: 100%    Final diagnoses:  Essential hypertension  Suicidal ideation      Final Clinical Impressions(s) / ED Diagnoses   Final diagnoses:  Essential hypertension  Suicidal ideation    ED Discharge Orders    None       Deno Etienne, DO 01/10/18 2224

## 2018-01-10 NOTE — H&P (Signed)
Behavioral Health Medical Screening Exam  Diamond Rice is an 47 y.o. female patient presents as walk in at [facility].  Patient states that she was at [facility] who sent her to [facility].  Patient states that she has been having suicidal thoughts and is unable to contract for safety.   Total Time spent with patient: 30 minutes  Psychiatric Specialty Exam: Physical Exam  Vitals reviewed. Constitutional: She is oriented to person, place, and time. She appears well-developed.  Obese  Neck: Normal range of motion. Neck supple.  Cardiovascular:  Elevated blood pressure; sending to [facility] for medical clearance  Respiratory: Effort normal.  Musculoskeletal: Normal range of motion.  Neurological: She is alert and oriented to person, place, and time.  Skin: Skin is warm and dry.  Psychiatric: Her speech is normal. Her mood appears anxious. Cognition and memory are normal. She expresses impulsivity. She exhibits a depressed mood. She expresses suicidal ideation. She expresses suicidal plans.    Review of Systems  Respiratory: Negative for cough and shortness of breath.   Cardiovascular: Negative for chest pain, palpitations, orthopnea, claudication and leg swelling.       History of HTN  Neurological:       History of stroke  Psychiatric/Behavioral: Positive for depression, hallucinations (Reports that she is seeing shadows) and suicidal ideas. Substance abuse: Daily use of THC. The patient is nervous/anxious.   All other systems reviewed and are negative.   Blood pressure (!) 202/105, pulse 82, temperature 98.9 F (37.2 C), temperature source Oral, resp. rate 16, SpO2 100 %.There is no height or weight on file to calculate BMI.  General Appearance: Casual  Eye Contact:  Good  Speech:  Clear and Coherent and Normal Rate  Volume:  Normal  Mood:  Anxious and Depressed  Affect:  Depressed and Flat  Thought Process:  Coherent and Goal Directed  Orientation:  Full (Time, Place, and Person)   Thought Content:  Hallucinations: Visual  Suicidal Thoughts:  Yes.  without intent/plan; but unable to contact for safety  Homicidal Thoughts:  No  Memory:  Immediate;   Good Recent;   Good Remote;   Good  Judgement:  Impaired  Insight:  Lacking  Psychomotor Activity:  Normal  Concentration: Concentration: Fair and Attention Span: Fair  Recall:  Good  Fund of Knowledge:Good  Language: Good  Akathisia:  No  Handed:  Right  AIMS (if indicated):     Assets:  Communication Skills Desire for Improvement Housing Social Support Transportation  Sleep:       Musculoskeletal: Strength & Muscle Tone: within normal limits Gait & Station: normal Patient leans: N/A  Blood pressure (!) 202/105, pulse 82, temperature 98.9 F (37.2 C), temperature source Oral, resp. rate 16, SpO2 100 %.  Recommendations:  Inpatient psychiatric treatment once medically cleared.  Sending to [facility] related to increased blood pressure and history of stroke.    Based on my evaluation the patient appears to have an emergency medical condition for which I recommend the patient be transferred to the emergency department for further evaluation.  Luke Falero, NP 01/10/2018, 5:46 PM

## 2018-01-11 ENCOUNTER — Other Ambulatory Visit: Payer: Self-pay

## 2018-01-11 ENCOUNTER — Encounter (HOSPITAL_COMMUNITY): Payer: Self-pay | Admitting: *Deleted

## 2018-01-11 ENCOUNTER — Inpatient Hospital Stay (HOSPITAL_COMMUNITY)
Admission: AD | Admit: 2018-01-11 | Discharge: 2018-01-15 | DRG: 885 | Disposition: A | Discharge status: Home / Self Care | Payer: Medicaid Other | Source: Intra-hospital | Attending: Emergency Medicine | Admitting: Emergency Medicine

## 2018-01-11 DIAGNOSIS — E1165 Type 2 diabetes mellitus with hyperglycemia: Secondary | ICD-10-CM | POA: Diagnosis not present

## 2018-01-11 DIAGNOSIS — G8929 Other chronic pain: Secondary | ICD-10-CM | POA: Diagnosis present

## 2018-01-11 DIAGNOSIS — F1290 Cannabis use, unspecified, uncomplicated: Secondary | ICD-10-CM | POA: Diagnosis not present

## 2018-01-11 DIAGNOSIS — Z6841 Body Mass Index (BMI) 40.0 and over, adult: Secondary | ICD-10-CM | POA: Diagnosis not present

## 2018-01-11 DIAGNOSIS — Z8659 Personal history of other mental and behavioral disorders: Secondary | ICD-10-CM

## 2018-01-11 DIAGNOSIS — G4733 Obstructive sleep apnea (adult) (pediatric): Secondary | ICD-10-CM | POA: Diagnosis present

## 2018-01-11 DIAGNOSIS — E118 Type 2 diabetes mellitus with unspecified complications: Secondary | ICD-10-CM | POA: Diagnosis present

## 2018-01-11 DIAGNOSIS — M545 Low back pain: Secondary | ICD-10-CM | POA: Diagnosis present

## 2018-01-11 DIAGNOSIS — F1490 Cocaine use, unspecified, uncomplicated: Secondary | ICD-10-CM | POA: Diagnosis not present

## 2018-01-11 DIAGNOSIS — Z811 Family history of alcohol abuse and dependence: Secondary | ICD-10-CM | POA: Diagnosis not present

## 2018-01-11 DIAGNOSIS — F250 Schizoaffective disorder, bipolar type: Principal | ICD-10-CM | POA: Diagnosis present

## 2018-01-11 DIAGNOSIS — F419 Anxiety disorder, unspecified: Secondary | ICD-10-CM | POA: Diagnosis present

## 2018-01-11 DIAGNOSIS — F329 Major depressive disorder, single episode, unspecified: Secondary | ICD-10-CM | POA: Diagnosis not present

## 2018-01-11 DIAGNOSIS — I10 Essential (primary) hypertension: Secondary | ICD-10-CM | POA: Diagnosis not present

## 2018-01-11 DIAGNOSIS — I69354 Hemiplegia and hemiparesis following cerebral infarction affecting left non-dominant side: Secondary | ICD-10-CM | POA: Diagnosis not present

## 2018-01-11 DIAGNOSIS — Z79899 Other long term (current) drug therapy: Secondary | ICD-10-CM | POA: Diagnosis not present

## 2018-01-11 DIAGNOSIS — F17210 Nicotine dependence, cigarettes, uncomplicated: Secondary | ICD-10-CM | POA: Diagnosis present

## 2018-01-11 DIAGNOSIS — R41843 Psychomotor deficit: Secondary | ICD-10-CM | POA: Diagnosis present

## 2018-01-11 DIAGNOSIS — Z794 Long term (current) use of insulin: Secondary | ICD-10-CM

## 2018-01-11 DIAGNOSIS — R471 Dysarthria and anarthria: Secondary | ICD-10-CM | POA: Diagnosis present

## 2018-01-11 DIAGNOSIS — R4781 Slurred speech: Secondary | ICD-10-CM | POA: Diagnosis not present

## 2018-01-11 DIAGNOSIS — F0630 Mood disorder due to known physiological condition, unspecified: Secondary | ICD-10-CM

## 2018-01-11 DIAGNOSIS — R51 Headache: Secondary | ICD-10-CM | POA: Diagnosis present

## 2018-01-11 DIAGNOSIS — Z8673 Personal history of transient ischemic attack (TIA), and cerebral infarction without residual deficits: Secondary | ICD-10-CM | POA: Diagnosis not present

## 2018-01-11 DIAGNOSIS — Z634 Disappearance and death of family member: Secondary | ICD-10-CM | POA: Diagnosis not present

## 2018-01-11 DIAGNOSIS — I252 Old myocardial infarction: Secondary | ICD-10-CM

## 2018-01-11 DIAGNOSIS — F1414 Cocaine abuse with cocaine-induced mood disorder: Secondary | ICD-10-CM | POA: Diagnosis present

## 2018-01-11 DIAGNOSIS — E669 Obesity, unspecified: Secondary | ICD-10-CM | POA: Diagnosis present

## 2018-01-11 DIAGNOSIS — G4700 Insomnia, unspecified: Secondary | ICD-10-CM | POA: Diagnosis present

## 2018-01-11 DIAGNOSIS — F1994 Other psychoactive substance use, unspecified with psychoactive substance-induced mood disorder: Secondary | ICD-10-CM

## 2018-01-11 DIAGNOSIS — F12280 Cannabis dependence with cannabis-induced anxiety disorder: Secondary | ICD-10-CM | POA: Diagnosis present

## 2018-01-11 DIAGNOSIS — J449 Chronic obstructive pulmonary disease, unspecified: Secondary | ICD-10-CM | POA: Diagnosis present

## 2018-01-11 DIAGNOSIS — F1914 Other psychoactive substance abuse with psychoactive substance-induced mood disorder: Secondary | ICD-10-CM | POA: Diagnosis not present

## 2018-01-11 DIAGNOSIS — Z9114 Patient's other noncompliance with medication regimen: Secondary | ICD-10-CM

## 2018-01-11 DIAGNOSIS — F1410 Cocaine abuse, uncomplicated: Secondary | ICD-10-CM | POA: Diagnosis not present

## 2018-01-11 DIAGNOSIS — Z720 Tobacco use: Secondary | ICD-10-CM | POA: Diagnosis not present

## 2018-01-11 DIAGNOSIS — R45851 Suicidal ideations: Secondary | ICD-10-CM

## 2018-01-11 DIAGNOSIS — F1210 Cannabis abuse, uncomplicated: Secondary | ICD-10-CM | POA: Diagnosis not present

## 2018-01-11 LAB — RAPID URINE DRUG SCREEN, HOSP PERFORMED
AMPHETAMINES: NOT DETECTED
BENZODIAZEPINES: NOT DETECTED
Barbiturates: NOT DETECTED
COCAINE: NOT DETECTED
OPIATES: NOT DETECTED
Tetrahydrocannabinol: POSITIVE — AB

## 2018-01-11 LAB — CBG MONITORING, ED
GLUCOSE-CAPILLARY: 173 mg/dL — AB (ref 65–99)
GLUCOSE-CAPILLARY: 206 mg/dL — AB (ref 65–99)

## 2018-01-11 LAB — GLUCOSE, CAPILLARY: Glucose-Capillary: 234 mg/dL — ABNORMAL HIGH (ref 65–99)

## 2018-01-11 MED ORDER — ALBUTEROL SULFATE HFA 108 (90 BASE) MCG/ACT IN AERS: 2.0000 | INHALATION_SPRAY | RESPIRATORY_TRACT | Status: DC | PRN

## 2018-01-11 MED ORDER — CLONIDINE HCL 0.2 MG PO TABS
0.2000 mg | ORAL_TABLET | Freq: Three times a day (TID) | ORAL | Status: DC
  Administered 2018-01-12: 0.2 mg via ORAL
  Filled 2018-01-11 (×4): qty 1

## 2018-01-11 MED ORDER — PANTOPRAZOLE SODIUM 40 MG PO TBEC
40.0000 mg | DELAYED_RELEASE_TABLET | Freq: Every day | ORAL | Status: DC
  Administered 2018-01-12 – 2018-01-15 (×4): 40 mg via ORAL
  Filled 2018-01-11 (×7): qty 1

## 2018-01-11 MED ORDER — HYDRALAZINE HCL 25 MG PO TABS
25.0000 mg | ORAL_TABLET | Freq: Three times a day (TID) | ORAL | Status: DC
  Administered 2018-01-11 – 2018-01-13 (×5): 25 mg via ORAL
  Filled 2018-01-11 (×10): qty 1

## 2018-01-11 MED ORDER — MAGNESIUM HYDROXIDE 400 MG/5ML PO SUSP: 30.0000 mL | Freq: Every day | ORAL | Status: DC | PRN

## 2018-01-11 MED ORDER — MIRTAZAPINE 15 MG PO TABS
15.0000 mg | ORAL_TABLET | Freq: Every day | ORAL | Status: DC
  Administered 2018-01-11 – 2018-01-14 (×3): 15 mg via ORAL
  Filled 2018-01-11 (×8): qty 1

## 2018-01-11 MED ORDER — AMLODIPINE BESYLATE 10 MG PO TABS
10.0000 mg | ORAL_TABLET | Freq: Every day | ORAL | Status: DC
  Administered 2018-01-12 – 2018-01-15 (×4): 10 mg via ORAL
  Filled 2018-01-11 (×7): qty 1

## 2018-01-11 MED ORDER — ALUM & MAG HYDROXIDE-SIMETH 200-200-20 MG/5ML PO SUSP
30.0000 mL | ORAL | Status: DC | PRN
  Administered 2018-01-14: 30 mL via ORAL
  Filled 2018-01-11: qty 30

## 2018-01-11 MED ORDER — ARIPIPRAZOLE 15 MG PO TABS
15.0000 mg | ORAL_TABLET | Freq: Every day | ORAL | Status: DC
  Administered 2018-01-12 – 2018-01-13 (×2): 15 mg via ORAL
  Filled 2018-01-11 (×3): qty 1

## 2018-01-11 MED ORDER — LISINOPRIL 20 MG PO TABS
40.0000 mg | ORAL_TABLET | Freq: Every day | ORAL | Status: DC
  Administered 2018-01-12 – 2018-01-15 (×4): 40 mg via ORAL
  Filled 2018-01-11 (×2): qty 2
  Filled 2018-01-11: qty 1
  Filled 2018-01-11 (×3): qty 2
  Filled 2018-01-11: qty 1
  Filled 2018-01-11: qty 2

## 2018-01-11 MED ORDER — PNEUMOCOCCAL VAC POLYVALENT 25 MCG/0.5ML IJ INJ: 0.5000 mL | INJECTION | INTRAMUSCULAR | Status: DC

## 2018-01-11 MED ORDER — CITALOPRAM HYDROBROMIDE 10 MG PO TABS
10.0000 mg | ORAL_TABLET | Freq: Every day | ORAL | Status: DC
  Administered 2018-01-11: 10 mg via ORAL
  Filled 2018-01-11: qty 1

## 2018-01-11 MED ORDER — INSULIN DETEMIR 100 UNIT/ML ~~LOC~~ SOLN
15.0000 [IU] | Freq: Every day | SUBCUTANEOUS | Status: DC
  Administered 2018-01-11 – 2018-01-14 (×4): 15 [IU] via SUBCUTANEOUS
  Filled 2018-01-11: qty 0.15

## 2018-01-11 MED ORDER — ACETAMINOPHEN 325 MG PO TABS
650.0000 mg | ORAL_TABLET | Freq: Four times a day (QID) | ORAL | Status: DC | PRN
  Administered 2018-01-11 – 2018-01-15 (×4): 650 mg via ORAL
  Filled 2018-01-11 (×5): qty 2

## 2018-01-11 NOTE — ED Notes (Signed)
PER PSYCH MD - PT IS NOW BEING CHANGED TO IN-PT PLACED

## 2018-01-11 NOTE — Progress Notes (Addendum)
Patient ID: Gatha Mcnulty, female   DOB: Jul 31, 1971, 47 y.o.   MRN: [id number] Pt is 47 yo AA female, admitted to [facility], for her feelings of SI, s/p 2 CVA ( 2014 and 2016), with remaining memory impairment.Marland Kitchenas well as diff ambulating ( requiring use of 3pronged cane). She demonstrates a slow, flat affet. Her speech is slurred. She has difficulty articulating her words. She makes poor eye contact. SHe is not forthcoming with informatoin at this time, she says her PMH is consistent with 2 CVA's with HTN,, insulin - depenedent DM, poor short term memory and schizoaffective. She is cooperative during admission process. Writer identified patient's high fall risk status during admission, explaining to [facility] how pt was high fall risk ( poor mobility with impaired cognitive ) and will put pt's 3 pronged cane up, encourage pt to utilize call bell and wheel chair. safeyt in palce

## 2018-01-11 NOTE — ED Notes (Signed)
Pt encouraged to provide urine specimen.  Pt stated "I can't right now."

## 2018-01-11 NOTE — Consult Note (Addendum)
[place] Psychiatry Consult   Reason for Consult:  Suicidal ideations with a plan Referring Physician:  EDP Patient Identification: Janin Kozlowski MRN:  [id number] Principal Diagnosis: Schizoaffective disorder, bipolar type [facility]) Diagnosis:   Patient Active Problem List   Diagnosis Date Noted  . Schizoaffective disorder, bipolar type ([place]) [F25.0] 10/07/2015    Priority: High  . Psychoactive substance-induced organic mood disorder ([place]) [T51.76, F06.30] 05/28/2013    Priority: Low  . Cocaine abuse with cocaine-induced mood disorder [facility]) [F14.14] 05/28/2013    Priority: Low  . Cannabis dependence with cannabis-induced anxiety disorder [facility]) [F12.280] 05/28/2013    Priority: Low  . Cannabis use disorder, moderate, dependence ([place]) [F12.20] 05/19/2017  . Cocaine use disorder, moderate, dependence ([place]) [F14.20] 05/19/2017  . OSA (obstructive sleep apnea) [G47.33] 06/09/2015  . Vitamin D deficiency [E55.9] 01/06/2015  . Bilateral knee pain [M25.561, M25.562] 01/05/2015  . Numbness and tingling in right hand [R20.0, R20.2] 01/05/2015  . Insomnia [G47.00] 12/07/2014  . Right-sided lacunar infarction ([place]) [I63.81] 09/15/2014  . Left hemiparesis ([place]) [G81.94] 09/15/2014  . Dyspnea [R06.00]   . Back pain [M54.9] 09/11/2013  . Morbid obesity with BMI of 45.0-49.9, adult ([place]) [E66.01, Z68.42] 04/25/2012  . Chest pain [R07.9] 04/17/2012  . Hypertension [I10] 04/17/2012  . Diabetes mellitus type 2 with complications, uncontrolled ([place]) [E11.8, E11.65] 04/17/2012  . Hyperlipidemia [E78.5] 04/17/2012  . Tobacco use [Z72.0] 04/17/2012    Total Time spent with patient: 45 minutes  Subjective:   Dinisha Cai is a 47 y.o. female patient admitted with suicide plan.  HPI:  47 yo female who presented to the ED with suicidal ideations and plan to overdose.  She has been depressed about losing her mother a few years ago and on Mother's day and her nephew died last week.  Today,  she went there to get a long-term injectable and they sent her to [facility] due to her suicide plan.  Past Psychiatric History: schizoaffective disorder  Risk to Self: Is patient at risk for suicide?: Yes Risk to Others:  No Prior Inpatient Therapy:  Yes Prior Outpatient Therapy:  [facility]  Past Medical History:  Past Medical History:  Diagnosis Date  . Anginal pain ([place])   . Anxiety   . Chronic lower back pain 04/17/2012   "just got over some; catched when I walked"  . COPD (chronic obstructive pulmonary disease) ([place])   . Depression   . Headache(784.0) 04/17/2012   "~ qod; lately waking up in am w/one"  . Heart attack ([place])   . High cholesterol   . Hypertension   . Migraines 04/17/2012  . Obesity   . Shortness of breath 04/17/2012   "all the time"  . Sleep apnea   . Stroke ([place])   . Type II diabetes mellitus ([place]) 08/28/2002    Past Surgical History:  Procedure Laterality Date  . CARDIAC CATHETERIZATION  ~ 2011   Family History:  Family History  Problem Relation Age of Onset  . Stroke Brother 87  . Stroke Brother 31  . Heart attack Mother 21  . Stroke Father 33   Family Psychiatric  History: none Social History:  Social History   Substance and Sexual Activity  Alcohol Use No  . Alcohol/week: 0.0 oz   Comment: rare     Social History   Substance and Sexual Activity  Drug Use Yes  . Types: Marijuana, "Crack" cocaine    Social History   Socioeconomic History  . Marital status: Single  Spouse name: Not on file  . Number of children: Not on file  . Years of education: Not on file  . Highest education level: Not on file  Occupational History  . Not on file  Social Needs  . Financial resource strain: Not on file  . Food insecurity:    Worry: Not on file    Inability: Not on file  . Transportation needs:    Medical: Not on file    Non-medical: Not on file  Tobacco Use  . Smoking status: Current Some Day Smoker    Packs/day: 0.00    Years: 10.00    Pack  years: 0.00    Types: Cigarettes    Last attempt to quit: 12/07/2014    Years since quitting: 3.0  . Smokeless tobacco: Never Used  . Tobacco comment: smoke a cigarette a day   Substance and Sexual Activity  . Alcohol use: No    Alcohol/week: 0.0 oz    Comment: rare  . Drug use: Yes    Types: Marijuana, "Crack" cocaine  . Sexual activity: Not Currently  Lifestyle  . Physical activity:    Days per week: Not on file    Minutes per session: Not on file  . Stress: Not on file  Relationships  . Social connections:    Talks on phone: Not on file    Gets together: Not on file    Attends religious service: Not on file    Active member of club or organization: Not on file    Attends meetings of clubs or organizations: Not on file    Relationship status: Not on file  Other Topics Concern  . 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   Additional Social History: N/A    Allergies:   Allergies  Allergen Reactions  . Morphine And Related Hives    Labs:  Results for orders placed or performed during the hospital encounter of 01/10/18 (from the past 48 hour(s))  Comprehensive metabolic panel     Status: Abnormal   Collection Time: 01/10/18 10:13 PM  Result Value Ref Range   Sodium 138 135 - 145 mmol/L   Potassium 3.2 (L) 3.5 - 5.1 mmol/L   Chloride 103 101 - 111 mmol/L   CO2 26 22 - 32 mmol/L   Glucose, Bld 208 (H) 65 - 99 mg/dL   BUN 10 6 - 20 mg/dL   Creatinine, Ser 1.04 (H) 0.44 - 1.00 mg/dL   Calcium 9.1 8.9 - 10.3 mg/dL   Total Protein 7.5 6.5 - 8.1 g/dL   Albumin 3.4 (L) 3.5 - 5.0 g/dL   AST 11 (L) 15 - 41 U/L   ALT 9 (L) 14 - 54 U/L   Alkaline Phosphatase 72 38 - 126 U/L   Total Bilirubin 0.3 0.3 - 1.2 mg/dL   GFR calc non Af Amer >60 >60 mL/min   GFR calc Af Amer >60 >60 mL/min    Comment: (NOTE) The eGFR has been calculated using the CKD EPI equation. This calculation has not been validated in all clinical  situations. eGFR's persistently <60 mL/min signify possible Chronic Kidney Disease.    Anion gap 9 5 - 15    Comment: Performed at [facility], [place] [address], [place], [place] [address]  Ethanol     Status: None   Collection Time: 01/10/18 10:13 PM  Result Value Ref Range   Alcohol,  Ethyl (B) <10 <10 mg/dL    Comment: (NOTE) Lowest detectable limit for serum alcohol is 10 mg/dL. For medical purposes only. Performed at [facility], [place] [address] [place], [place] [address]   Salicylate level     Status: None   Collection Time: 01/10/18 10:13 PM  Result Value Ref Range   Salicylate Lvl <9.3 2.8 - 30.0 mg/dL    Comment: Performed at [facility], [place] [address], [place], [place] [address]  Acetaminophen level     Status: Abnormal   Collection Time: 01/10/18 10:13 PM  Result Value Ref Range   Acetaminophen (Tylenol), Serum <10 (L) 10 - 30 ug/mL    Comment: (NOTE) Therapeutic concentrations vary significantly. A range of 10-30 ug/mL  may be an effective concentration for many patients. However, some  are best treated at concentrations outside of this range. Acetaminophen concentrations >150 ug/mL at 4 hours after ingestion  and >50 ug/mL at 12 hours after ingestion are often associated with  toxic reactions. Performed at [facility], [place] [address], [place], [place] [address]   cbc     Status: Abnormal   Collection Time: 01/10/18 10:13 PM  Result Value Ref Range   WBC 9.7 4.0 - 10.5 K/uL   RBC 4.83 3.87 - 5.11 MIL/uL   Hemoglobin 11.2 (L) 12.0 - 15.0 g/dL   HCT 34.6 (L) 36.0 - 46.0 %   MCV 71.6 (L) 78.0 - 100.0 fL   MCH 23.2 (L) 26.0 - 34.0 pg   MCHC 32.4 30.0 - 36.0 g/dL   RDW 16.3 (H) 11.5 - 15.5 %   Platelets 326 150 - 400 K/uL    Comment: Performed at [facility], [place] [address], [place], [place] [address]  I-Stat beta hCG blood, ED     Status: None   Collection Time:  01/10/18 10:20 PM  Result Value Ref Range   I-stat hCG, quantitative <5.0 <5 mIU/mL   Comment 3            Comment:   GEST. AGE      CONC.  (mIU/mL)   <=1 WEEK        5 - 50     2 WEEKS       50 - 500     3 WEEKS       100 - 10,000     4 WEEKS     1,000 - 30,000        FEMALE AND NON-PREGNANT FEMALE:     LESS THAN 5 mIU/mL   CBG monitoring, ED     Status: Abnormal   Collection Time: 01/10/18 11:22 PM  Result Value Ref Range   Glucose-Capillary 237 (H) 65 - 99 mg/dL  Rapid urine drug screen (hospital performed)     Status: Abnormal   Collection Time: 01/11/18  6:36 AM  Result Value Ref Range   Opiates NONE DETECTED NONE DETECTED   Cocaine NONE DETECTED NONE DETECTED   Benzodiazepines NONE DETECTED NONE DETECTED   Amphetamines NONE DETECTED NONE DETECTED   Tetrahydrocannabinol POSITIVE (A) NONE DETECTED   Barbiturates NONE DETECTED NONE DETECTED    Comment: (NOTE) DRUG SCREEN FOR MEDICAL PURPOSES ONLY.  IF CONFIRMATION IS NEEDED FOR ANY PURPOSE, NOTIFY LAB WITHIN 5 DAYS. LOWEST DETECTABLE LIMITS FOR URINE DRUG SCREEN Drug Class                     Cutoff (ng/mL) Amphetamine and metabolites  1000 Barbiturate and metabolites    200 Benzodiazepine                 191 Tricyclics and metabolites     300 Opiates and metabolites        300 Cocaine and metabolites        300 THC                            50 Performed at [facility], [place] [address] [place], [place] [address]   CBG monitoring, ED     Status: Abnormal   Collection Time: 01/11/18  8:03 AM  Result Value Ref Range   Glucose-Capillary 173 (H) 65 - 99 mg/dL    Current Facility-Administered Medications  Medication Dose Route Frequency Provider Last Rate Last Dose  . albuterol (PROVENTIL HFA;VENTOLIN HFA) 108 (90 Base) MCG/ACT inhaler 2 puff  2 puff Inhalation Q4H PRN Deno Etienne, DO      . amLODipine (NORVASC) tablet 10 mg  10 mg Oral Daily Deno Etienne, DO   10 mg at 01/11/18 1033  .  ARIPiprazole (ABILIFY) tablet 15 mg  15 mg Oral Daily Deno Etienne, DO   15 mg at 01/11/18 1034  . cloNIDine (CATAPRES) tablet 0.2 mg  0.2 mg Oral TID Deno Etienne, DO   0.2 mg at 01/11/18 1033  . hydrALAZINE (APRESOLINE) tablet 25 mg  25 mg Oral Q8H Floyd, Dan, DO   25 mg at 01/11/18 5621  . insulin detemir (LEVEMIR) injection 15 Units  15 Units Subcutaneous Q2200 Deno Etienne, DO   15 Units at 01/10/18 2353  . lisinopril (PRINIVIL,ZESTRIL) tablet 40 mg  40 mg Oral Daily Deno Etienne, DO   40 mg at 01/11/18 1037  . mirtazapine (REMERON) tablet 15 mg  15 mg Oral QHS Deno Etienne, DO   15 mg at 01/10/18 2350  . pantoprazole (PROTONIX) EC tablet 40 mg  40 mg Oral Daily Deno Etienne, DO   40 mg at 01/11/18 1034   Current Outpatient Medications  Medication Sig Dispense Refill  . amLODipine (NORVASC) 10 MG tablet Take 10 mg by mouth daily.    . ARIPiprazole (ABILIFY) 15 MG tablet Take 15 mg by mouth daily.    . cloNIDine (CATAPRES) 0.2 MG tablet Take 0.2 mg by mouth 3 (three) times daily.    . Insulin Detemir (LEVEMIR) 100 UNIT/ML Pen Inject 15 Units into the skin daily at 10 pm.    . lisinopril (PRINIVIL,ZESTRIL) 40 MG tablet Take 40 mg by mouth daily.    . mirtazapine (REMERON) 15 MG tablet Take 15 mg by mouth at bedtime.    . pantoprazole (PROTONIX) 40 MG tablet Take 40 mg by mouth daily.    Marland Kitchen albuterol (PROVENTIL HFA;VENTOLIN HFA) 108 (90 Base) MCG/ACT inhaler Inhale 2 puffs into the lungs every 4 (four) hours as needed for wheezing or shortness of breath. (Patient not taking: Reported on 01/10/2018) 1 Inhaler 0  . amLODipine (NORVASC) 10 MG tablet Take 1 tablet (10 mg total) by mouth daily. For high blood pressure (Patient not taking: Reported on 09/08/2017) 30 tablet 0  . ARIPiprazole (ABILIFY) 15 MG tablet Take 1 tablet (15 mg total) by mouth daily. For mood control (Patient not taking: Reported on 09/08/2017) 10 tablet 0  . ARIPiprazole ER 300 MG SRER Inject 300 mg into the muscle every 28 (twenty-eight)  days. For mood control (Patient not taking: Reported on 01/10/2018) 1 each  0  . cloNIDine (CATAPRES) 0.1 MG tablet Take 1 tablet (0.1 mg total) by mouth 3 (three) times daily. For high blood pressure (Patient not taking: Reported on 09/08/2017) 90 tablet 0  . ferrous sulfate 325 (65 FE) MG tablet Take 1 tablet (325 mg total) by mouth 2 (two) times daily with a meal. (Patient not taking: Reported on 09/08/2017) 60 tablet 0  . fluticasone (FLONASE) 50 MCG/ACT nasal spray Place 1 spray into both nostrils 2 (two) times daily. For allergies (Patient not taking: Reported on 09/08/2017) 16 g 0  . guaiFENesin (MUCINEX) 600 MG 12 hr tablet Take 2 tablets (1,200 mg total) by mouth 2 (two) times daily as needed for cough or to loosen phlegm. (Patient not taking: Reported on 09/08/2017) 30 tablet 0  . hydrALAZINE (APRESOLINE) 25 MG tablet Take 1 tablet (25 mg total) by mouth every 8 (eight) hours. For high blood pressure (Patient not taking: Reported on 09/08/2017) 90 tablet 0  . hydrOXYzine (ATARAX/VISTARIL) 25 MG tablet Take 1 tablet (25 mg total) by mouth 3 (three) times daily as needed for anxiety. (Patient not taking: Reported on 09/08/2017) 30 tablet 0  . insulin glargine (LANTUS) 100 UNIT/ML injection Inject 0.13 mLs (13 Units total) into the skin daily. For diabetes (Patient not taking: Reported on 01/10/2018) 10 mL 0  . lisinopril (PRINIVIL,ZESTRIL) 40 MG tablet Take 1 tablet (40 mg total) by mouth daily. For high blood pressure (Patient not taking: Reported on 01/10/2018) 30 tablet 0  . mirtazapine (REMERON) 15 MG tablet Take 1 tablet (15 mg total) by mouth at bedtime. For mood control (Patient not taking: Reported on 09/08/2017) 30 tablet 0  . pantoprazole (PROTONIX) 40 MG tablet Take 1 tablet (40 mg total) by mouth daily. For acid reflux (Patient not taking: Reported on 09/08/2017) 30 tablet 0  . sertraline (ZOLOFT) 100 MG tablet Take 1 tablet (100 mg total) by mouth daily. For mood control (Patient not taking:  Reported on 09/08/2017) 30 tablet 0  . traZODone (DESYREL) 50 MG tablet Take 1 tablet (50 mg total) by mouth at bedtime as needed for sleep. (Patient not taking: Reported on 09/08/2017) 30 tablet 0    Musculoskeletal: Strength & Muscle Tone: within normal limits Gait & Station: normal Patient leans: N/A  Psychiatric Specialty Exam: Physical Exam  Nursing note and vitals reviewed. Constitutional: She is oriented to person, place, and time. She appears well-developed and well-nourished.  HENT:  Head: Normocephalic and atraumatic.  Neck: Normal range of motion.  Respiratory: Effort normal.  Musculoskeletal: Normal range of motion.  Neurological: She is alert and oriented to person, place, and time.  Psychiatric: Her speech is normal and behavior is normal. Judgment normal. Cognition and memory are normal. She exhibits a depressed mood. She expresses suicidal ideation. She expresses suicidal plans.    Review of Systems  Psychiatric/Behavioral: Positive for depression and suicidal ideas.       Denies all symptoms on assessment  All other systems reviewed and are negative.   Blood pressure (!) 157/106, pulse 71, temperature 98.3 F (36.8 C), temperature source Oral, resp. rate 18, height '5\' 1"'$  (1.549 m), weight 99.8 kg (220 lb), SpO2 96 %.Body mass index is 41.57 kg/m.  General Appearance: Casual  Eye Contact:  Fair  Speech:  Normal Rate  Volume:  Normal  Mood:  Depressed  Affect:  Congruent  Thought Process:  Coherent and Descriptions of Associations: Intact  Orientation:  Full (Time, Place, and Person)  Thought Content:  WDL and  Logical  Suicidal Thoughts:  Yes.  with intent/plan  Homicidal Thoughts:  No  Memory:  Immediate;   Fair Recent;   Fair Remote;   Fair  Judgement:  Fair  Insight:  Fair  Psychomotor Activity:  Decreased  Concentration:  Concentration: Good and Attention Span: Good  Recall:  [employer] of Knowledge:  Fair  Language:  Good  Akathisia:  No  Handed:   Right  AIMS (if indicated):   N/A  Assets:  Leisure Time Physical Health Resilience  ADL's:  Intact  Cognition:  WNL  Sleep:   N/A     Treatment Plan Summary: Daily contact with patient to assess and evaluate symptoms and progress in treatment, Medication management and Plan schizoaffective disorder, bipolar type:  -Crisis stabilization -Medication management:  Restarted medical medications along with Abilify 15 mg daily for mood stabilization and Remeron 15 mg at bedtime for sleep, started Celexa 10 mg daily for depression -Individual counseling  Disposition: Recommend psychiatric Inpatient admission when medically cleared.  Waylan Boga, NP 01/11/2018 10:45 AM   Patient seen face-to-face for psychiatric evaluation, chart reviewed and case discussed with the physician extender and developed treatment plan. Reviewed the information documented and agree with the treatment plan.  Buford Dresser, DO 01/11/18 5:26 PM

## 2018-01-11 NOTE — BH Assessment (Addendum)
[facility] Assessment Progress Note  Per Buford Dresser, DO, this pt does not require psychiatric hospitalization at this time.  Pt is to discharged from [facility] with recommendation to continue treatment with [facility].  This has been included in pt's discharge instructions.  Pt's nurse, Caryl Pina, has been notified.  Jalene Mullet, MA Triage Specialist [phone]   Addendum:  After further review, Dr Mariea Clonts has determined that this pt requires psychiatric hospitalization at this time.  Leonia Reader, RN, [facility] has assigned pt to [facility] Rm [id number]; [place] will be ready to receive pt between 14:30 and 15:00.  Pt has signed Voluntary Admission and Consent for Treatment, as well as Consent to Release Information to no one, and signed forms have been faxed to [facility].  Pt's nurse, Caryl Pina, has been notified, and agrees to send original paperwork along with pt via [facility]ries, and to call report to [phone].  Jalene Mullet, [place] Coordinator [phone]

## 2018-01-11 NOTE — Tx Team (Signed)
Initial Treatment Plan 01/11/2018 7:15 PM [name] [id number]    PATIENT STRESSORS: Financial difficulties Health problems Legal issue   PATIENT STRENGTHS: Ability for insight Active sense of humor Average or above average intelligence   PATIENT IDENTIFIED PROBLEMS: Depression   Bipolar with recent SI      Non-compliant with med  S/p CVA  Uncontrolled HTN  Insulin - dependent Diabetes   " I don't want to be here"  " I don't have any family"   DISCHARGE CRITERIA:  Ability to meet basic life and health needs Adequate post-discharge living arrangements Improved stabilization in mood, thinking, and/or behavior  PRELIMINARY DISCHARGE PLAN: Attend aftercare/continuing care group Attend 12-step recovery group Outpatient therapy  PATIENT/FAMILY INVOLVEMENT: This treatment plan has been presented to and reviewed with the patient, Diamond Rice, and/or family member,  The patient and family have been given the opportunity to ask questions and make suggestions.  Diamond Primes, RN 01/11/2018, 7:15 PM

## 2018-01-11 NOTE — Progress Notes (Signed)
Adult Psychoeducational Group Note  Date:  01/11/2018 Time:  9:09 PM  Group Topic/Focus:  Wrap-Up Group:   The focus of this group is to help patients review their daily goal of treatment and discuss progress on daily workbooks.  Participation Level:  Did Not Attend   Edsel Petrin 01/11/2018, 9:09 PM

## 2018-01-11 NOTE — Discharge Instructions (Signed)
For your behavioral health needs, you are advised to continue treatment with [facility]: ° °     [facility] °     [address] °     [place], [place] [address] °     [phone] °

## 2018-01-11 NOTE — ED Notes (Signed)
TTS AT BEDSIDE 

## 2018-01-11 NOTE — Progress Notes (Signed)
Writer entered patients room and observed her lying in bed resting since being admitted to the unit. She c/o having a headache and requested food to eat. Writer informed her of her scheduled medications and she received tylenol for her headache. She reported not eating since this morning and received a sandwich tray. She was encouraged to rest and hopefully be up more so that she can attend groups on tomorrow. Support given and safety maintained on unit with 15 min checks.

## 2018-01-11 NOTE — ED Notes (Signed)
Pt again encouraged to provide urine specimen.

## 2018-01-11 NOTE — ED Notes (Signed)
PT HAS QUESTIONS ABOUT DISCHARGE. PSYCH MD IN SPEAKING WITH PT ABOUT DISCHARGE

## 2018-01-12 DIAGNOSIS — F250 Schizoaffective disorder, bipolar type: Principal | ICD-10-CM

## 2018-01-12 DIAGNOSIS — Z8673 Personal history of transient ischemic attack (TIA), and cerebral infarction without residual deficits: Secondary | ICD-10-CM

## 2018-01-12 DIAGNOSIS — Z9114 Patient's other noncompliance with medication regimen: Secondary | ICD-10-CM

## 2018-01-12 DIAGNOSIS — Z79899 Other long term (current) drug therapy: Secondary | ICD-10-CM

## 2018-01-12 DIAGNOSIS — Z811 Family history of alcohol abuse and dependence: Secondary | ICD-10-CM

## 2018-01-12 DIAGNOSIS — F17210 Nicotine dependence, cigarettes, uncomplicated: Secondary | ICD-10-CM

## 2018-01-12 DIAGNOSIS — F12280 Cannabis dependence with cannabis-induced anxiety disorder: Secondary | ICD-10-CM

## 2018-01-12 DIAGNOSIS — F1414 Cocaine abuse with cocaine-induced mood disorder: Secondary | ICD-10-CM

## 2018-01-12 DIAGNOSIS — I10 Essential (primary) hypertension: Secondary | ICD-10-CM

## 2018-01-12 DIAGNOSIS — Z634 Disappearance and death of family member: Secondary | ICD-10-CM

## 2018-01-12 DIAGNOSIS — E1165 Type 2 diabetes mellitus with hyperglycemia: Secondary | ICD-10-CM

## 2018-01-12 DIAGNOSIS — Z794 Long term (current) use of insulin: Secondary | ICD-10-CM

## 2018-01-12 LAB — GLUCOSE, CAPILLARY
GLUCOSE-CAPILLARY: 190 mg/dL — AB (ref 65–99)
GLUCOSE-CAPILLARY: 242 mg/dL — AB (ref 65–99)
GLUCOSE-CAPILLARY: 291 mg/dL — AB (ref 65–99)

## 2018-01-12 MED ORDER — CLONIDINE HCL 0.1 MG PO TABS
0.3000 mg | ORAL_TABLET | Freq: Three times a day (TID) | ORAL | Status: DC
  Administered 2018-01-12 – 2018-01-15 (×8): 0.3 mg via ORAL
  Filled 2018-01-12 (×18): qty 3

## 2018-01-12 MED ORDER — CLONIDINE HCL 0.1 MG PO TABS
0.1000 mg | ORAL_TABLET | Freq: Once | ORAL | Status: AC
  Administered 2018-01-12: 0.1 mg via ORAL
  Filled 2018-01-12 (×2): qty 1

## 2018-01-12 NOTE — BHH Group Notes (Signed)
[place] Group Notes: (Clinical Social Work)   01/12/2018      Type of Therapy:  Group Therapy   Participation Level:  Did Not Attend despite MHT prompting   Tye Savoy, LCSW  01/12/2018 12:55 PM

## 2018-01-12 NOTE — Progress Notes (Signed)
D: Patient presents lethargic, depressed, guarded, isolative, and irritable. Patient spent most of the morning in bed, and relied heavily on a wheelchair when out of bed. PT consult was placed this morning. Patient lives with a friend and receives disability income. She admits to having suicidal thoughts "off and on". She denies HI/AVH.  A: Patient checked q15 min, and checks reviewed. Reviewed medication changes with patient and educated on side effects. Educated patient on importance of attending group therapy sessions and educated on several coping skills. Encouarged participation in milieu through recreation therapy and attending meals with peers. Provided support and encouragement. R: Patient receptive to education on medications, and is medication compliant. Patient did not attend morning therapy sessions or fill out a patient self inventory. Patient lacks coping skills to manage depression. Patient contracts for safety on the unit.

## 2018-01-12 NOTE — H&P (Signed)
Psychiatric Admission Assessment Adult  Patient Identification: Diamond Rice MRN:  [id number] Date of Evaluation:  01/12/2018 Chief Complaint:  BIPOLAR 1 DISORDER Principal Diagnosis: <principal problem not specified> Diagnosis:   Patient Active Problem List   Diagnosis Date Noted  . Cannabis use disorder, moderate, dependence ([place]) [F12.20] 05/19/2017  . Cocaine use disorder, moderate, dependence ([place]) [F14.20] 05/19/2017  . Schizoaffective disorder, bipolar type ([place]) [F25.0] 10/07/2015  . OSA (obstructive sleep apnea) [G47.33] 06/09/2015  . Vitamin D deficiency [E55.9] 01/06/2015  . Bilateral knee pain [M25.561, M25.562] 01/05/2015  . Numbness and tingling in right hand [R20.0, R20.2] 01/05/2015  . Insomnia [G47.00] 12/07/2014  . Right-sided lacunar infarction ([place]) [I63.81] 09/15/2014  . Left hemiparesis ([place]) [G81.94] 09/15/2014  . Dyspnea [R06.00]   . Back pain [M54.9] 09/11/2013  . Psychoactive substance-induced organic mood disorder ([place]) [B15.17, F06.30] 05/28/2013  . Cocaine abuse with cocaine-induced mood disorder ([place]) [F14.14] 05/28/2013  . Cannabis dependence with cannabis-induced anxiety disorder ([place]) [F12.280] 05/28/2013  . Morbid obesity with BMI of 45.0-49.9, adult ([place]) [E66.01, Z68.42] 04/25/2012  . Chest pain [R07.9] 04/17/2012  . Hypertension [I10] 04/17/2012  . Diabetes mellitus type 2 with complications, uncontrolled ([place]) [E11.8, E11.65] 04/17/2012  . Hyperlipidemia [E78.5] 04/17/2012  . Tobacco use [Z72.0] 04/17/2012   History of Present Illness: Patient is seen and examined.  Patient is a 47 year old female with a past psychiatric history significant for schizoaffective disorder; bipolar type, history of psychoactive substance induced organic mood disorder, cocaine abuse with cocaine induced mood disorder, cannabis dependence with cannabis induced anxiety disorder as well as multiple medical diagnoses including status post CVA, hypertension, and poorly  controlled type 2 diabetes mellitus.  Patient presented to the [facility] emergency department yesterday with suicidal ideation.  The patient stated that after mother states she became more depressed and suicidal.  She was grieving for her mother.  Her mother passed away in Dec 22, 2006.  She also stated a nephew had died recently.  She had been noncompliant on her psychiatric medications for several months.  Her last psychiatric hospitalization was at the [facility] in January of this year.  She is also been previously on the long-acting Abilify injection.  When she restarted her medication she was placed on the oral form.  She went to the mental health center yesterday seeking the injection, and expressed suicidal ideation to the staff.  At that point she was sent to the emergency department for suicidal ideation.  She was admitted to the hospital for evaluation and stabilization. Associated Signs/Symptoms: Depression Symptoms:  depressed mood, anhedonia, insomnia, psychomotor retardation, fatigue, feelings of worthlessness/guilt, difficulty concentrating, hopelessness, recurrent thoughts of death, suicidal thoughts without plan, anxiety, (Hypo) Manic Symptoms:  Impulsivity, Anxiety Symptoms:  Excessive Worry, Psychotic Symptoms:  Hallucinations: Auditory PTSD Symptoms: Negative Total Time spent with patient: 1 hour  Past Psychiatric History: Patient is had several psychiatric admissions.  She has a reported past psychiatric history significant for major depression, polysubstance use disorders, schizoaffective disorder, bipolar disorder.  Is the patient at risk to self? Yes.    Has the patient been a risk to self in the past 6 months? Yes.    Has the patient been a risk to self within the distant past? No.  Is the patient a risk to others? No.  Has the patient been a risk to others in the past 6 months? No.  Has the patient been a risk to others within the distant past? No.   Prior Inpatient  Therapy:   Prior  Outpatient Therapy:    Alcohol Screening: 1. How often do you have a drink containing alcohol?: Monthly or less 2. How many drinks containing alcohol do you have on a typical day when you are drinking?: 3 or 4 3. How often do you have six or more drinks on one occasion?: Less than monthly AUDIT-C Score: 3 4. How often during the last year have you found that you were not able to stop drinking once you had started?: Less than monthly 5. How often during the last year have you failed to do what was normally expected from you becasue of drinking?: Less than monthly 6. How often during the last year have you needed a first drink in the morning to get yourself going after a heavy drinking session?: Less than monthly 7. How often during the last year have you had a feeling of guilt of remorse after drinking?: Weekly 8. How often during the last year have you been unable to remember what happened the night before because you had been drinking?: Monthly 9. Have you or someone else been injured as a result of your drinking?: Yes, but not in the last year 10. Has a relative or friend or a doctor or another health worker been concerned about your drinking or suggested you cut down?: Yes, but not in the last year Alcohol Use Disorder Identification Test Final Score (AUDIT): 15 Intervention/Follow-up: Alcohol Education Substance Abuse History in the last 12 months:  Yes.   Consequences of Substance Abuse: Medical Consequences:  Is unclear but she has had an effect of the cocaine as well as her tobacco and marijuana use on her medical health including her hypertension, stroke and diabetes. Previous Psychotropic Medications: Yes  Psychological Evaluations: Yes  Past Medical History:  Past Medical History:  Diagnosis Date  . Anginal pain ([place])   . Anxiety   . Chronic lower back pain 04/17/2012   "just got over some; catched when I walked"  . COPD (chronic obstructive pulmonary disease)  ([place])   . Depression   . Headache(784.0) 04/17/2012   "~ qod; lately waking up in am w/one"  . Heart attack ([place])   . High cholesterol   . Hypertension   . Migraines 04/17/2012  . Obesity   . Shortness of breath 04/17/2012   "all the time"  . Sleep apnea   . Stroke ([place])   . Type II diabetes mellitus ([place]) 08/28/2002    Past Surgical History:  Procedure Laterality Date  . CARDIAC CATHETERIZATION  ~ 2011   Family History:  Family History  Problem Relation Age of Onset  . Stroke Brother 72  . Stroke Brother 86  . Heart attack Mother 81  . Stroke Father 76   Family Psychiatric  History: Father was an alcoholic. Tobacco Screening: Have you used any form of tobacco in the last 30 days? (Cigarettes, Smokeless Tobacco, Cigars, and/or Pipes): Yes Tobacco use, Select all that apply: 5 or more cigarettes per day Are you interested in Tobacco Cessation Medications?: No, patient refused Counseled patient on smoking cessation including recognizing danger situations, developing coping skills and basic information about quitting provided: Refused/Declined practical counseling Social History:  Social History   Substance and Sexual Activity  Alcohol Use No  . Alcohol/week: 0.0 oz   Comment: rare     Social History   Substance and Sexual Activity  Drug Use Yes  . Types: Marijuana, "Crack" cocaine    Additional Social History:      Pain Medications:  denies Prescriptions: abilify, remeron Over the Counter: denies History of alcohol / drug use?: Yes Name of Substance 1: cocaine 1 - Last Use / Amount: "a while ago" Name of Substance 2: marijuana 2 - Age of First Use: 17 2 - Amount (size/oz): varies 2 - Frequency: daily 2 - Duration: ongoing 2 - Last Use / Amount: last night                Allergies:   Allergies  Allergen Reactions  . Morphine And Related Hives   Lab Results:  Results for orders placed or performed during the hospital encounter of 01/11/18 (from the  past 48 hour(s))  Glucose, capillary     Status: Abnormal   Collection Time: 01/11/18  8:20 PM  Result Value Ref Range   Glucose-Capillary 234 (H) 65 - 99 mg/dL    Blood Alcohol level:  Lab Results  Component Value Date   ETH <10 01/10/2018   ETH <10 70/35/0093    Metabolic Disorder Labs:  Lab Results  Component Value Date   HGBA1C 12.3 (H) 10/09/2015   MPG 306 10/09/2015   MPG 249 (H) 08/28/2014   No results found for: PROLACTIN Lab Results  Component Value Date   CHOL 196 08/28/2014   TRIG 129 08/28/2014   HDL 45 08/28/2014   CHOLHDL 4.4 08/28/2014   VLDL 26 08/28/2014   LDLCALC 125 (H) 08/28/2014   LDLCALC 107 (H) 09/04/2013    Current Medications: Current Facility-Administered Medications  Medication Dose Route Frequency Provider Last Rate Last Dose  . acetaminophen (TYLENOL) tablet 650 mg  650 mg Oral Q6H PRN Patrecia Pour, NP   650 mg at 01/12/18 0640  . albuterol (PROVENTIL HFA;VENTOLIN HFA) 108 (90 Base) MCG/ACT inhaler 2 puff  2 puff Inhalation Q4H PRN Patrecia Pour, NP      . alum & mag hydroxide-simeth (MAALOX/MYLANTA) 200-200-20 MG/5ML suspension 30 mL  30 mL Oral Q4H PRN Patrecia Pour, NP      . amLODipine (NORVASC) tablet 10 mg  10 mg Oral Daily Patrecia Pour, NP      . ARIPiprazole (ABILIFY) tablet 15 mg  15 mg Oral Daily Waylan Boga Y, NP      . cloNIDine (CATAPRES) tablet 0.2 mg  0.2 mg Oral TID Patrecia Pour, NP      . hydrALAZINE (APRESOLINE) tablet 25 mg  25 mg Oral Q8H Patrecia Pour, NP   25 mg at 01/12/18 8182  . insulin detemir (LEVEMIR) injection 15 Units  15 Units Subcutaneous Q2200 Patrecia Pour, NP   15 Units at 01/11/18 2144  . lisinopril (PRINIVIL,ZESTRIL) tablet 40 mg  40 mg Oral Daily Lord, Jamison Y, NP      . magnesium hydroxide (MILK OF MAGNESIA) suspension 30 mL  30 mL Oral Daily PRN Patrecia Pour, NP      . mirtazapine (REMERON) tablet 15 mg  15 mg Oral QHS Patrecia Pour, NP   15 mg at 01/11/18 2141  . pantoprazole  (PROTONIX) EC tablet 40 mg  40 mg Oral Daily Waylan Boga Y, NP      . pneumococcal 23 valent vaccine (PNU-IMMUNE) injection 0.5 mL  0.5 mL Intramuscular Tomorrow-1000 Cobos, Myer Peer, MD       PTA Medications: Medications Prior to Admission  Medication Sig Dispense Refill Last Dose  . amLODipine (NORVASC) 10 MG tablet Take 1 tablet (10 mg total) by mouth daily. For high blood pressure (Patient not taking: Reported  on 09/08/2017) 30 tablet 0 More than a month at Unknown time  . ARIPiprazole (ABILIFY) 15 MG tablet Take 1 tablet (15 mg total) by mouth daily. For mood control (Patient not taking: Reported on 09/08/2017) 10 tablet 0 More than a month at Unknown time  . ARIPiprazole ER 300 MG SRER Inject 300 mg into the muscle every 28 (twenty-eight) days. For mood control (Patient not taking: Reported on 01/10/2018) 1 each 0 More than a month at Unknown time  . cloNIDine (CATAPRES) 0.2 MG tablet Take 0.2 mg by mouth 3 (three) times daily.   More than a month at Unknown time  . hydrALAZINE (APRESOLINE) 25 MG tablet Take 1 tablet (25 mg total) by mouth every 8 (eight) hours. For high blood pressure (Patient not taking: Reported on 09/08/2017) 90 tablet 0 More than a month at Unknown time  . hydrOXYzine (ATARAX/VISTARIL) 25 MG tablet Take 1 tablet (25 mg total) by mouth 3 (three) times daily as needed for anxiety. (Patient not taking: Reported on 09/08/2017) 30 tablet 0 More than a month at Unknown time  . Insulin Detemir (LEVEMIR) 100 UNIT/ML Pen Inject 15 Units into the skin daily at 10 pm.   01/09/2018 at Unknown time  . lisinopril (PRINIVIL,ZESTRIL) 40 MG tablet Take 1 tablet (40 mg total) by mouth daily. For high blood pressure (Patient not taking: Reported on 01/10/2018) 30 tablet 0 Not Taking at Unknown time    Musculoskeletal: Strength & Muscle Tone: decreased Gait & Station: unsteady Patient leans: N/A  Psychiatric Specialty Exam: Physical Exam  Constitutional: She is oriented to person, place,  and time. She appears well-developed and well-nourished.  HENT:  Head: Normocephalic and atraumatic.  Respiratory: Effort normal.  Neurological: She is alert and oriented to person, place, and time.    ROS  Blood pressure (!) 175/104, pulse (!) 102, temperature 98.6 F (37 C), temperature source Oral, resp. rate 18, height 5\' 1"  (1.549 m), weight 99.3 kg (219 lb), SpO2 100 %.Body mass index is 41.38 kg/m.  General Appearance: Disheveled  Eye Contact:  Minimal  Speech:  Slow  Volume:  Decreased  Mood:  Depressed  Affect:  Congruent  Thought Process:  Coherent  Orientation:  Full (Time, Place, and Person)  Thought Content:  Logical  Suicidal Thoughts:  Yes.  without intent/plan  Homicidal Thoughts:  No  Memory:  Immediate;   Fair  Judgement:  Impaired  Insight:  Lacking  Psychomotor Activity:  Decreased  Concentration:  Concentration: Fair  Recall:  [employer] of Knowledge:  Fair  Language:  Fair  Akathisia:  Negative  Handed:  Right  AIMS (if indicated):     Assets:  Desire for Improvement  ADL's:  Intact  Cognition:  WNL  Sleep:  Number of Hours: 6.75    Treatment Plan Summary: Daily contact with patient to assess and evaluate symptoms and progress in treatment, Medication management and Plan Patient is seen and examined.  Patient is a 47 year old female with the above-stated past psychiatric history was admitted with suicidal ideation.  She is only been back on her psychiatric medications for approximately 2 weeks.  We will continue the oral Abilify as well as the oral mirtazapine at this point.  We will monitor for reduction in her depressive symptoms as well as her hallucinations.  She also has significant medical problems including malignant hypertension as well as poorly controlled diabetes mellitus and being status post CVA.  She continues to smoke cigarettes as well as marijuana.  Her drug screen was negative for cocaine, but in the past she has used cocaine.  We will  continue her Lantus insulin at bedtime, and place her on sliding scale.  Her blood pressure continues to be poorly controlled at this point.  I am getting increase her clonidine to 0.3 mg p.o. 3 times daily.  If necessary we may increase her hydralazine.  Hopefully she will improve during the course of the hospitalization.  We will also get a physical therapy consult so that she can get a walker versus using the wheelchair as a walker.  Observation Level/Precautions:  Fall 15 minute checks  Laboratory:  Chemistry Profile  Psychotherapy:    Medications:    Consultations:    Discharge Concerns:    Estimated LOS:  Other:     Physician Treatment Plan for Primary Diagnosis: <principal problem not specified> Long Term Goal(s): Improvement in symptoms so as ready for discharge  Short Term Goals: Ability to identify changes in lifestyle to reduce recurrence of condition will improve, Ability to verbalize feelings will improve, Ability to disclose and discuss suicidal ideas, Ability to demonstrate self-control will improve, Ability to identify and develop effective coping behaviors will improve, Ability to maintain clinical measurements within normal limits will improve, Compliance with prescribed medications will improve and Ability to identify triggers associated with substance abuse/mental health issues will improve  Physician Treatment Plan for Secondary Diagnosis: Active Problems:   Schizoaffective disorder, bipolar type ([place])  Long Term Goal(s): Improvement in symptoms so as ready for discharge  Short Term Goals: Ability to identify changes in lifestyle to reduce recurrence of condition will improve, Ability to verbalize feelings will improve, Ability to disclose and discuss suicidal ideas, Ability to demonstrate self-control will improve, Ability to identify and develop effective coping behaviors will improve, Ability to maintain clinical measurements within normal limits will improve, Compliance  with prescribed medications will improve and Ability to identify triggers associated with substance abuse/mental health issues will improve  I certify that inpatient services furnished can reasonably be expected to improve the patient's condition.    Sharma Covert, MD 5/18/20198:46 AM

## 2018-01-12 NOTE — [facility] Suicide Risk Assessment (Signed)
[facility] Admission Suicide Risk Assessment   Nursing information obtained from:    Demographic factors:    Current Mental Status:    Loss Factors:    Historical Factors:    Risk Reduction Factors:     Total Time spent with patient: 45 minutes Principal Problem: <principal problem not specified> Diagnosis:   Patient Active Problem List   Diagnosis Date Noted  . Cannabis use disorder, moderate, dependence ([place]) [F12.20] 05/19/2017  . Cocaine use disorder, moderate, dependence ([place]) [F14.20] 05/19/2017  . Schizoaffective disorder, bipolar type ([place]) [F25.0] 10/07/2015  . OSA (obstructive sleep apnea) [G47.33] 06/09/2015  . Vitamin D deficiency [E55.9] 01/06/2015  . Bilateral knee pain [M25.561, M25.562] 01/05/2015  . Numbness and tingling in right hand [R20.0, R20.2] 01/05/2015  . Insomnia [G47.00] 12/07/2014  . Right-sided lacunar infarction ([place]) [I63.81] 09/15/2014  . Left hemiparesis ([place]) [G81.94] 09/15/2014  . Dyspnea [R06.00]   . Back pain [M54.9] 09/11/2013  . Psychoactive substance-induced organic mood disorder ([place]) [N05.39, F06.30] 05/28/2013  . Cocaine abuse with cocaine-induced mood disorder ([place]) [F14.14] 05/28/2013  . Cannabis dependence with cannabis-induced anxiety disorder ([place]) [F12.280] 05/28/2013  . Morbid obesity with BMI of 45.0-49.9, adult ([place]) [E66.01, Z68.42] 04/25/2012  . Chest pain [R07.9] 04/17/2012  . Hypertension [I10] 04/17/2012  . Diabetes mellitus type 2 with complications, uncontrolled ([place]) [E11.8, E11.65] 04/17/2012  . Hyperlipidemia [E78.5] 04/17/2012  . Tobacco use [Z72.0] 04/17/2012   Subjective Data: Patient is seen and examined.  Patient is a 47 year old female with a past psychiatric history significant for schizoaffective disorder; bipolar type, history of psychoactive substance induced organic mood disorder, cocaine abuse with cocaine induced mood disorder, cannabis dependence with cannabis induced anxiety disorder as well as medical  diagnoses including status post CVA, hypertension, diabetes type 2.  Patient presented to the [facility] emergency department yesterday with suicidal ideation.  The patient stated that after Mother's Day she became more depressed and more suicidal.  Her mother passed away in 2006-12-06.  She also stated a nephew had died recently.  She had had a period of time where she was noncompliant with her psychiatric medicines for several months, and started back on her medicines 2 weeks ago.  She had been previously on the long-acting Abilify injection, but it been switched to the oral form.  She presented to the mental health clinic yesterday seeking the injection, and expressed suicidal ideation.  She was sent to the emergency department due to suicidal ideation.  She was seen in the emergency department and the decision was made to admit her to the hospital for evaluation and stabilization.  Her last psychiatric admission appears to be in January of this year.  She had presented to our facility, but was transferred to the [facility].  Continued Clinical Symptoms:  Alcohol Use Disorder Identification Test Final Score (AUDIT): 15 The "Alcohol Use Disorders Identification Test", Guidelines for Use in Primary Care, Second Edition.  World Pharmacologist [facility]). Score between 0-7:  no or low risk or alcohol related problems. Score between 8-15:  moderate risk of alcohol related problems. Score between 16-19:  high risk of alcohol related problems. Score 20 or above:  warrants further diagnostic evaluation for alcohol dependence and treatment.   CLINICAL FACTORS:   Bipolar Disorder:   Depressive phase Depression:   Anhedonia Comorbid alcohol abuse/dependence Hopelessness Impulsivity Alcohol/Substance Abuse/Dependencies Schizophrenia:   Depressive state   Musculoskeletal: Strength & Muscle Tone: decreased Gait & Station: unsteady Patient leans: N/A  Psychiatric Specialty Exam: Physical Exam  Constitutional: She is oriented to person, place, and time. She appears well-developed and well-nourished.  HENT:  Head: Normocephalic and atraumatic.  Respiratory: Effort normal.  Neurological: She is alert and oriented to person, place, and time.    ROS  Blood pressure (!) 175/104, pulse (!) 102, temperature 98.6 F (37 C), temperature source Oral, resp. rate 18, height 5\' 1"  (1.549 m), weight 99.3 kg (219 lb), SpO2 100 %.Body mass index is 41.38 kg/m.  General Appearance: Disheveled  Eye Contact:  Poor  Speech:  Slow  Volume:  Decreased  Mood:  Depressed  Affect:  Congruent  Thought Process:  Coherent  Orientation:  Full (Time, Place, and Person)  Thought Content:  Logical  Suicidal Thoughts:  Yes.  without intent/plan  Homicidal Thoughts:  No  Memory:  Immediate;   Fair  Judgement:  Impaired  Insight:  Lacking  Psychomotor Activity:  Decreased  Concentration:  Concentration: Fair  Recall:  [employer] of Knowledge:  Poor  Language:  Fair  Akathisia:  Negative  Handed:  Right  AIMS (if indicated):     Assets:  Desire for Improvement  ADL's:  Intact  Cognition:  WNL  Sleep:  Number of Hours: 6.75      COGNITIVE FEATURES THAT CONTRIBUTE TO RISK:  None    SUICIDE RISK:   Mild:  Suicidal ideation of limited frequency, intensity, duration, and specificity.  There are no identifiable plans, no associated intent, mild dysphoria and related symptoms, good self-control (both objective and subjective assessment), few other risk factors, and identifiable protective factors, including available and accessible social support.  PLAN OF CARE: Patient is seen and examined.  Patient is a 47 year old female with the above-stated past psychiatric history who was admitted to the hospital because of suicidal ideation.  She had been restarted on her medicines approximately 2 weeks ago, and they really have not had enough time to be benefit.  She has been continued on oral Abilify,  mirtazapine 15 mg p.o. nightly.  We will monitor this over the next 24 hours to make a decision as to whether increase the dosages are not.  She has multiple medical problems including status post CVA.  She has right-sided weakness.  She is using a wheelchair as a walker currently.  We will call physical therapy.  Additionally her blood pressure is poorly controlled, and still remains elevated with a systolic at approximately 170.  She is currently on 4 different antihypertensives, and may have to increase her clonidine or hydralazine.  Her blood sugar is not well controlled.  She is on Levemir at at bedtime and we will continue that.  Hopefully she will improve in the next several days.  I certify that inpatient services furnished can reasonably be expected to improve the patient's condition.   Sharma Covert, MD 01/12/2018, 8:39 AM
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
## 2018-01-12 NOTE — Plan of Care (Signed)
Patient is medication compliant, but otherwise has minimal participation in assessment. Patient remained in bed most of the morning. Appears depressed and withdrawn.

## 2018-01-12 NOTE — BH Assessment (Signed)
CSW attempted to meet with PT at 11:10 for first attempt to complete PSA. Patient declined and stated "give me some time". CSW attempted to meet with PT again around noon. Patient did not get up or acknowledge CSW knocking or speaking.    Tye Savoy, [place]  01/12/2018 12:46 PM

## 2018-01-13 ENCOUNTER — Encounter (HOSPITAL_COMMUNITY): Payer: Self-pay

## 2018-01-13 ENCOUNTER — Inpatient Hospital Stay (HOSPITAL_COMMUNITY): Payer: Medicaid Other

## 2018-01-13 DIAGNOSIS — F1914 Other psychoactive substance abuse with psychoactive substance-induced mood disorder: Secondary | ICD-10-CM

## 2018-01-13 LAB — I-STAT BETA HCG BLOOD, ED (MC, WL, AP ONLY): I-stat hCG, quantitative: <5 m[IU]/mL (ref <5)

## 2018-01-13 LAB — COMPREHENSIVE METABOLIC PANEL
ALT: 7 U/L — ABNORMAL LOW (ref 14–54)
AST: 11 U/L — ABNORMAL LOW (ref 15–41)
Albumin: 2.9 g/dL — ABNORMAL LOW (ref 3.5–5.0)
Alkaline Phosphatase: 62 U/L (ref 38–126)
Anion gap: 6 (ref 5–15)
BUN: 14 mg/dL (ref 6–20)
CO2: 27 mmol/L (ref 22–32)
Calcium: 8.9 mg/dL (ref 8.9–10.3)
Chloride: 104 mmol/L (ref 101–111)
Creatinine, Ser: 1.41 mg/dL — ABNORMAL HIGH (ref 0.44–1.00)
GFR calc Af Amer: 51 mL/min — ABNORMAL LOW (ref >60)
GFR calc non Af Amer: 44 mL/min — ABNORMAL LOW (ref >60)
Glucose, Bld: 227 mg/dL — ABNORMAL HIGH (ref 65–99)
Potassium: 3.5 mmol/L (ref 3.5–5.1)
Sodium: 137 mmol/L (ref 135–145)
Total Bilirubin: 0.5 mg/dL (ref 0.3–1.2)
Total Protein: 6.6 g/dL (ref 6.5–8.1)

## 2018-01-13 LAB — CBC
HCT: 35.1 % — ABNORMAL LOW (ref 36.0–46.0)
Hemoglobin: 10.9 g/dL — ABNORMAL LOW (ref 12.0–15.0)
MCH: 22.3 pg — ABNORMAL LOW (ref 26.0–34.0)
MCHC: 31.1 g/dL (ref 30.0–36.0)
MCV: 71.9 fL — ABNORMAL LOW (ref 78.0–100.0)
Platelets: 345 10*3/uL (ref 150–400)
RBC: 4.88 MIL/uL (ref 3.87–5.11)
RDW: 15.8 % — ABNORMAL HIGH (ref 11.5–15.5)
WBC: 7 10*3/uL (ref 4.0–10.5)

## 2018-01-13 LAB — GLUCOSE, CAPILLARY
GLUCOSE-CAPILLARY: 182 mg/dL — AB (ref 65–99)
Glucose-Capillary: 256 mg/dL — ABNORMAL HIGH (ref 65–99)
Glucose-Capillary: 276 mg/dL — ABNORMAL HIGH (ref 65–99)

## 2018-01-13 LAB — URINALYSIS, ROUTINE W REFLEX MICROSCOPIC
Bilirubin Urine: NEGATIVE
Glucose, UA: >=500 mg/dL — AB
Hgb urine dipstick: NEGATIVE
Ketones, ur: NEGATIVE mg/dL
Leukocytes, UA: NEGATIVE
Nitrite: NEGATIVE
Protein, ur: 30 mg/dL — AB
Specific Gravity, Urine: 1.021 (ref 1.005–1.030)
pH: 5 (ref 5.0–8.0)

## 2018-01-13 LAB — I-STAT TROPONIN, ED: Troponin i, poc: 0.01 ng/mL (ref 0.00–0.08)

## 2018-01-13 LAB — RAPID URINE DRUG SCREEN, HOSP PERFORMED
Amphetamines: NOT DETECTED
Barbiturates: NOT DETECTED
Benzodiazepines: NOT DETECTED
Cocaine: NOT DETECTED
Opiates: NOT DETECTED
Tetrahydrocannabinol: POSITIVE — AB

## 2018-01-13 LAB — DIFFERENTIAL
ABS IMMATURE GRANULOCYTES: 0 10*3/uL (ref 0.0–0.1)
BASOS PCT: 0 %
Basophils Absolute: 0 10*3/uL (ref 0.0–0.1)
Eosinophils Absolute: 0.2 10*3/uL (ref 0.0–0.7)
Eosinophils Relative: 3 %
Immature Granulocytes: 0 %
LYMPHS ABS: 3.3 10*3/uL (ref 0.7–4.0)
Lymphocytes Relative: 47 %
MONOS PCT: 8 %
Monocytes Absolute: 0.5 10*3/uL (ref 0.1–1.0)
Neutro Abs: 2.9 10*3/uL (ref 1.7–7.7)
Neutrophils Relative %: 42 %

## 2018-01-13 LAB — PROTIME-INR
INR: 0.98
Prothrombin Time: 12.9 seconds (ref 11.4–15.2)

## 2018-01-13 LAB — CBG MONITORING, ED: GLUCOSE-CAPILLARY: 198 mg/dL — AB (ref 65–99)

## 2018-01-13 LAB — ETHANOL: Alcohol, Ethyl (B): <10 mg/dL (ref <10)

## 2018-01-13 LAB — APTT: aPTT: 32 seconds (ref 24–36)

## 2018-01-13 MED ORDER — ACETAMINOPHEN 500 MG PO TABS
1000.0000 mg | ORAL_TABLET | Freq: Once | ORAL | Status: AC
  Administered 2018-01-13: 1000 mg via ORAL
  Filled 2018-01-13: qty 2

## 2018-01-13 MED ORDER — HYDRALAZINE HCL 50 MG PO TABS
50.0000 mg | ORAL_TABLET | Freq: Three times a day (TID) | ORAL | Status: DC
  Administered 2018-01-13 – 2018-01-15 (×6): 50 mg via ORAL
  Filled 2018-01-13 (×3): qty 1
  Filled 2018-01-13: qty 2
  Filled 2018-01-13 (×12): qty 1

## 2018-01-13 MED ORDER — ARIPIPRAZOLE 15 MG PO TABS
15.0000 mg | ORAL_TABLET | Freq: Every day | ORAL | Status: DC
Start: 2018-01-14 — End: 2018-01-15
  Administered 2018-01-14: 15 mg via ORAL
  Filled 2018-01-13 (×4): qty 1

## 2018-01-13 NOTE — BHH Group Notes (Signed)
[place] LCSW Group Therapy Note  01/13/2018  10:00-11:00AM  Type of Therapy and Topic:  Group Therapy:  Building Supports  Participation Level:  none  Description of Group:  Patients in this group were introduced to the idea of adding a variety of healthy supports to address the various needs in their lives.  Different types of support were defined and described, and patients were asked to act out what each type could be.  Patients discussed what additional healthy supports could be helpful in their recovery and wellness after discharge in order to prevent future hospitalizations.   An emphasis was placed on following up with the discharge plan when they leave the hospital in order to continue becoming healthier and happier.  Therapeutic Goals: 1)  demonstrate the importance of adding supports  2)  discuss 4 definitions of support  3)  identify the patient's current level of healthy support and   4)  elicit commitments to add one healthy support   Summary of Patient Progress:  Patient was present but did not participate in group. Patient reports having no supports at the beginning of group and did not complete the worksheet or engage in discussion.   Therapeutic Modalities:   Motivational Interviewing Brief Solution-Focused Therapy  Tye Savoy

## 2018-01-13 NOTE — Progress Notes (Signed)
Adult Psychoeducational Group Note  Date:  01/13/2018 Time:  2:03 AM  Group Topic/Focus:  Wrap-Up Group:   The focus of this group is to help patients review their daily goal of treatment and discuss progress on daily workbooks.  Participation Level:  Minimal  Participation Quality:  Appropriate  Affect:  Appropriate  Cognitive:  Appropriate  Insight: Appropriate and Limited  Engagement in Group:  Limited  Modes of Intervention:  Discussion  Additional Comments:  Pt stated he goal for today was to get some rest. Pt stated she accomplished her goal today. Pt rated her day a 5 out of 10. Pt stated her goal for tomorrow is to attend and program in groups.  Candy Sledge 01/13/2018, 2:03 AM

## 2018-01-13 NOTE — ED Triage Notes (Signed)
Pt arrived via [facility]; per EMS pt from [facility] with c/o slurred speech. LKW 530p today. Pt has Hx of CVA and MI with previous L sided deficits including increased wkness, slurred peech, and facila droop. Pt c/o HA in frontal lobe; CBG 238; 111/60; 98% on RA; 18; 18GRAC

## 2018-01-13 NOTE — ED Provider Notes (Signed)
10:13 PM Patient signed out to me by Dr. Melina Copa at shift change.  Patient sent to ED from [facility] for increased slurred speech.  Hx of prior stroke, though this could be medication related for mental illness.  Dr. Melina Copa discussed the case with Dr. Leonel Ramsay from neurology, who recommends MRI.  DC back to [facility] if MRI negative.  No acute intracranial process seen on MRI.  Chronic changes listed in report below.    Speech has improved.  Patient believes that her symptoms are related to the medications she has been taking.  I lean toward this as well.  Results for orders placed or performed during the hospital encounter of 01/11/18  Glucose, capillary  Result Value Ref Range   Glucose-Capillary 234 (H) 65 - 99 mg/dL  Glucose, capillary  Result Value Ref Range   Glucose-Capillary 190 (H) 65 - 99 mg/dL  Glucose, capillary  Result Value Ref Range   Glucose-Capillary 242 (H) 65 - 99 mg/dL   Comment 1 Notify RN    Comment 2 Document in Chart   Glucose, capillary  Result Value Ref Range   Glucose-Capillary 291 (H) 65 - 99 mg/dL  Glucose, capillary  Result Value Ref Range   Glucose-Capillary 182 (H) 65 - 99 mg/dL  Glucose, capillary  Result Value Ref Range   Glucose-Capillary 256 (H) 65 - 99 mg/dL  Glucose, capillary  Result Value Ref Range   Glucose-Capillary 276 (H) 65 - 99 mg/dL  Ethanol  Result Value Ref Range   Alcohol, Ethyl (B) <10 <10 mg/dL  Protime-INR  Result Value Ref Range   Prothrombin Time 12.9 11.4 - 15.2 seconds   INR 0.98   APTT  Result Value Ref Range   aPTT 32 24 - 36 seconds  CBC  Result Value Ref Range   WBC 7.0 4.0 - 10.5 K/uL   RBC 4.88 3.87 - 5.11 MIL/uL   Hemoglobin 10.9 (L) 12.0 - 15.0 g/dL   HCT 35.1 (L) 36.0 - 46.0 %   MCV 71.9 (L) 78.0 - 100.0 fL   MCH 22.3 (L) 26.0 - 34.0 pg   MCHC 31.1 30.0 - 36.0 g/dL   RDW 15.8 (H) 11.5 - 15.5 %   Platelets 345 150 - 400 K/uL  Differential  Result Value Ref Range   Neutrophils Relative % 42 %   Neutro Abs  2.9 1.7 - 7.7 K/uL   Lymphocytes Relative 47 %   Lymphs Abs 3.3 0.7 - 4.0 K/uL   Monocytes Relative 8 %   Monocytes Absolute 0.5 0.1 - 1.0 K/uL   Eosinophils Relative 3 %   Eosinophils Absolute 0.2 0.0 - 0.7 K/uL   Basophils Relative 0 %   Basophils Absolute 0.0 0.0 - 0.1 K/uL   Immature Granulocytes 0 %   Abs Immature Granulocytes 0.0 0.0 - 0.1 K/uL  Comprehensive metabolic panel  Result Value Ref Range   Sodium 137 135 - 145 mmol/L   Potassium 3.5 3.5 - 5.1 mmol/L   Chloride 104 101 - 111 mmol/L   CO2 27 22 - 32 mmol/L   Glucose, Bld 227 (H) 65 - 99 mg/dL   BUN 14 6 - 20 mg/dL   Creatinine, Ser 1.41 (H) 0.44 - 1.00 mg/dL   Calcium 8.9 8.9 - 10.3 mg/dL   Total Protein 6.6 6.5 - 8.1 g/dL   Albumin 2.9 (L) 3.5 - 5.0 g/dL   AST 11 (L) 15 - 41 U/L   ALT 7 (L) 14 - 54 U/L  Alkaline Phosphatase 62 38 - 126 U/L   Total Bilirubin 0.5 0.3 - 1.2 mg/dL   GFR calc non Af Amer 44 (L) >60 mL/min   GFR calc Af Amer 51 (L) >60 mL/min   Anion gap 6 5 - 15  Urine rapid drug screen (hosp performed)  Result Value Ref Range   Opiates NONE DETECTED NONE DETECTED   Cocaine NONE DETECTED NONE DETECTED   Benzodiazepines NONE DETECTED NONE DETECTED   Amphetamines NONE DETECTED NONE DETECTED   Tetrahydrocannabinol POSITIVE (A) NONE DETECTED   Barbiturates NONE DETECTED NONE DETECTED  Urinalysis, Routine w reflex microscopic  Result Value Ref Range   Color, Urine YELLOW YELLOW   APPearance CLEAR CLEAR   Specific Gravity, Urine 1.021 1.005 - 1.030   pH 5.0 5.0 - 8.0   Glucose, UA >=500 (A) NEGATIVE mg/dL   Hgb urine dipstick NEGATIVE NEGATIVE   Bilirubin Urine NEGATIVE NEGATIVE   Ketones, ur NEGATIVE NEGATIVE mg/dL   Protein, ur 30 (A) NEGATIVE mg/dL   Nitrite NEGATIVE NEGATIVE   Leukocytes, UA NEGATIVE NEGATIVE   RBC / HPF 0-5 0 - 5 RBC/hpf   WBC, UA 0-5 0 - 5 WBC/hpf   Bacteria, UA RARE (A) NONE SEEN   Squamous Epithelial / LPF 0-5 0 - 5   Mucus PRESENT    Hyaline Casts, UA PRESENT    I-stat troponin, ED  Result Value Ref Range   Troponin i, poc 0.01 0.00 - 0.08 ng/mL   Comment 3          I-Stat beta hCG blood, ED  Result Value Ref Range   I-stat hCG, quantitative <5.0 <5 mIU/mL   Comment 3          CBG monitoring, ED  Result Value Ref Range   Glucose-Capillary 198 (H) 65 - 99 mg/dL   Ct Head Wo Contrast  Result Date: 01/13/2018 CLINICAL DATA:  Slurred speech and LEFT frontal headache. Last seen normal at 5:30 p.m. History of stroke with LEFT-sided weakness. History of migraines, diabetes, mild cardial infarction, hyperlipidemia. EXAM: CT HEAD WITHOUT CONTRAST TECHNIQUE: Contiguous axial images were obtained from the base of the skull through the vertex without intravenous contrast. COMPARISON:  MRI of the head September 08, 2014 FINDINGS: BRAIN: No intraparenchymal hemorrhage, mass effect nor midline shift. Old bilateral basal ganglia and LEFT thalamus small infarcts. Patchy supratentorial white matter hypodensities. Mild ex vacuo dilatation lateral ventricles without overall parenchymal brain volume loss for age. No hydrocephalus. No abnormal extra-axial fluid collections. Basal cisterns are patent. VASCULAR: Mild calcific atherosclerosis carotid siphons. Mastoid air cells are well aerated. SKULL/SOFT TISSUES: No skull fracture. No significant soft tissue swelling. ORBITS/SINUSES: The included ocular globes and orbital contents are normal.The mastoid aircells and included paranasal sinuses are well-aerated. OTHER: None. IMPRESSION: 1. No acute intracranial process. 2. Old basal ganglia and LEFT thalamus lacunar infarcts. Moderate white matter changes most compatible with chronic small vessel ischemic disease though there could be a component of chronic demyelination. Electronically Signed   By: Elon Alas M.D.   On: 01/13/2018 19:39   Mr Brain Wo Contrast  Result Date: 01/14/2018 CLINICAL DATA:  Initial evaluation for increased difficulty with speech with worsened left  leg weakness. History of prior stroke. EXAM: MRI HEAD WITHOUT CONTRAST TECHNIQUE: Multiplanar, multiecho pulse sequences of the brain and surrounding structures were obtained without intravenous contrast. COMPARISON:  Prior CT from 01/13/2018 as well as previous MRI from 09/08/2014. FINDINGS: Brain: Generalized age-related cerebral atrophy. Patchy and confluent T2/FLAIR  hyperintensity within the periventricular, and deep white matter both cerebral hemispheres, most consistent with chronic small vessel ischemic change, mildly progressed relative to 2016. Superimposed remote lacunar infarcts present within the bilateral basal ganglia/corona radiata as well as the left thalamus. No abnormal foci of restricted diffusion to suggest acute or subacute ischemia. Gray-white matter differentiation maintained. No areas of remote cortical infarction. No acute intracranial hemorrhage. Multiple scattered chronic micro hemorrhages seen involving the bilateral cerebral and left cerebellar hemisphere, most notable about the deep gray nuclei, most like related to chronic underlying hypertension. No mass lesion, midline shift or mass effect. No hydrocephalus. No extra-axial fluid collection. Major dural sinuses are patent. Pituitary gland suprasellar region normal. Midline structures intact and normal. Vascular: Major intracranial vascular flow voids are maintained at the skull base. Skull and upper cervical spine: Craniocervical junction within normal limits. Upper cervical spine demonstrates no acute finding. Bone marrow signal intensity somewhat diffusely decreased on T1 weighted imaging, most commonly related to anemia, smoking, or obesity. No scalp soft tissue abnormality. Sinuses/Orbits: Globes and orbital soft tissues within normal limits. Paranasal sinuses are largely clear. No mastoid effusion. Inner ear structures normal. Other: None. IMPRESSION: 1. No acute intracranial infarct or other abnormality identified. 2. Moderate  chronic small vessel ischemic disease with scattered remote lacunar infarcts involving the bilateral basal ganglia/corona radiata and left thalamus. 3. Multiple chronic micro hemorrhages involving the supratentorial and infratentorial brain, most likely related to chronic underlying hypertension. Electronically Signed   By: Jeannine Boga M.D.   On: 01/14/2018 03:28      Montine Circle, PA-C 01/14/18 [id number]    Hayden Rasmussen, MD 01/14/18 [id number]

## 2018-01-13 NOTE — Progress Notes (Signed)
[facility] MD Progress Note  01/13/2018 10:35 AM Diamond Rice  MRN:  [id number] Subjective: Patient is seen and examined.  Patient's 47 year old female with past psychiatric history significant for schizoaffective disorder; bipolar type, history of psychoactive substance induced organic mood disorder, cocaine abuse with cocaine induced mood disorder, cannabis dependence with cannabis induced anxiety disorder as well as multiple medical diagnoses including status post CVA, hypertension and poorly controlled type 2 diabetes mellitus.  She is seen in follow-up.  He stated she felt better today.  She stated she was not having suicidal thoughts.  She stated that she had signed a 72-hour letter to be released.  She stated that she had to get out of the hospital tomorrow to take care of business at home.  She denied any suicidal ideation.  She denied any complaints today. Principal Problem: <principal problem not specified> Diagnosis:   Patient Active Problem List   Diagnosis Date Noted  . Cannabis use disorder, moderate, dependence ([place]) [F12.20] 05/19/2017  . Cocaine use disorder, moderate, dependence ([place]) [F14.20] 05/19/2017  . Schizoaffective disorder, bipolar type ([place]) [F25.0] 10/07/2015  . OSA (obstructive sleep apnea) [G47.33] 06/09/2015  . Vitamin D deficiency [E55.9] 01/06/2015  . Bilateral knee pain [M25.561, M25.562] 01/05/2015  . Numbness and tingling in right hand [R20.0, R20.2] 01/05/2015  . Insomnia [G47.00] 12/07/2014  . Right-sided lacunar infarction ([place]) [I63.81] 09/15/2014  . Left hemiparesis ([place]) [G81.94] 09/15/2014  . Dyspnea [R06.00]   . Back pain [M54.9] 09/11/2013  . Psychoactive substance-induced organic mood disorder ([place]) [D98.33, F06.30] 05/28/2013  . Cocaine abuse with cocaine-induced mood disorder ([place]) [F14.14] 05/28/2013  . Cannabis dependence with cannabis-induced anxiety disorder ([place]) [F12.280] 05/28/2013  . Morbid obesity with BMI of 45.0-49.9, adult ([place])  [E66.01, Z68.42] 04/25/2012  . Chest pain [R07.9] 04/17/2012  . Hypertension [I10] 04/17/2012  . Diabetes mellitus type 2 with complications, uncontrolled ([place]) [E11.8, E11.65] 04/17/2012  . Hyperlipidemia [E78.5] 04/17/2012  . Tobacco use [Z72.0] 04/17/2012   Total Time spent with patient: 20 minutes  Past Psychiatric History: See admission H&P  Past Medical History:  Past Medical History:  Diagnosis Date  . Anginal pain ([place])   . Anxiety   . Chronic lower back pain 04/17/2012   "just got over some; catched when I walked"  . COPD (chronic obstructive pulmonary disease) ([place])   . Depression   . Headache(784.0) 04/17/2012   "~ qod; lately waking up in am w/one"  . Heart attack ([place])   . High cholesterol   . Hypertension   . Migraines 04/17/2012  . Obesity   . Shortness of breath 04/17/2012   "all the time"  . Sleep apnea   . Stroke ([place])   . Type II diabetes mellitus ([place]) 08/28/2002    Past Surgical History:  Procedure Laterality Date  . CARDIAC CATHETERIZATION  ~ 2011   Family History:  Family History  Problem Relation Age of Onset  . Stroke Brother 93  . Stroke Brother 76  . Heart attack Mother 86  . Stroke Father 84   Family Psychiatric  History: See admission H&P Social History:  Social History   Substance and Sexual Activity  Alcohol Use No  . Alcohol/week: 0.0 oz   Comment: rare     Social History   Substance and Sexual Activity  Drug Use Yes  . Types: Marijuana, "Crack" cocaine    Social History   Socioeconomic History  . Marital status: Single    Spouse name: Not on file  . Number  of children: Not on file  . Years of education: Not on file  . Highest education level: Not on file  Occupational History  . Not on file  Social Needs  . Financial resource strain: Not on file  . Food insecurity:    Worry: Not on file    Inability: Not on file  . Transportation needs:    Medical: Not on file    Non-medical: Not on file  Tobacco Use  . Smoking  status: Current Some Day Smoker    Packs/day: 0.00    Years: 10.00    Pack years: 0.00    Types: Cigarettes    Last attempt to quit: 12/07/2014    Years since quitting: 3.1  . Smokeless tobacco: Never Used  . Tobacco comment: smoke a cigarette a day   Substance and Sexual Activity  . Alcohol use: No    Alcohol/week: 0.0 oz    Comment: rare  . Drug use: Yes    Types: Marijuana, "Crack" cocaine  . Sexual activity: Not Currently  Lifestyle  . Physical activity:    Days per week: Not on file    Minutes per session: Not on file  . Stress: Not on file  Relationships  . Social connections:    Talks on phone: Not on file    Gets together: Not on file    Attends religious service: Not on file    Active member of club or organization: Not on file    Attends meetings of clubs or organizations: Not on file    Relationship status: Not on file  Other Topics Concern  . 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   Additional Social History:    Pain Medications: denies Prescriptions: abilify, remeron Over the Counter: denies History of alcohol / drug use?: Yes Name of Substance 1: cocaine 1 - Last Use / Amount: "a while ago" Name of Substance 2: marijuana 2 - Age of First Use: 17 2 - Amount (size/oz): varies 2 - Frequency: daily 2 - Duration: ongoing 2 - Last Use / Amount: last night                Sleep: Fair  Appetite:  Good  Current Medications: Current Facility-Administered Medications  Medication Dose Route Frequency Provider Last Rate Last Dose  . acetaminophen (TYLENOL) tablet 650 mg  650 mg Oral Q6H PRN Patrecia Pour, NP   650 mg at 01/13/18 0802  . albuterol (PROVENTIL HFA;VENTOLIN HFA) 108 (90 Base) MCG/ACT inhaler 2 puff  2 puff Inhalation Q4H PRN Patrecia Pour, NP      . alum & mag hydroxide-simeth (MAALOX/MYLANTA) 200-200-20 MG/5ML suspension 30 mL  30 mL Oral Q4H PRN Patrecia Pour, NP      .  amLODipine (NORVASC) tablet 10 mg  10 mg Oral Daily Patrecia Pour, NP   10 mg at 01/13/18 0803  . ARIPiprazole (ABILIFY) tablet 15 mg  15 mg Oral Daily Patrecia Pour, NP   15 mg at 01/13/18 0803  . cloNIDine (CATAPRES) tablet 0.3 mg  0.3 mg Oral TID Sharma Covert, MD   0.3 mg at 01/13/18 0803  . hydrALAZINE (APRESOLINE) tablet 25 mg  25 mg Oral Q8H Patrecia Pour, NP   25 mg at 01/13/18 5284  . insulin detemir (LEVEMIR) injection 15 Units  15 Units Subcutaneous Q2200 Patrecia Pour, NP   15 Units  at 01/12/18 2120  . lisinopril (PRINIVIL,ZESTRIL) tablet 40 mg  40 mg Oral Daily Patrecia Pour, NP   40 mg at 01/13/18 0803  . magnesium hydroxide (MILK OF MAGNESIA) suspension 30 mL  30 mL Oral Daily PRN Patrecia Pour, NP      . mirtazapine (REMERON) tablet 15 mg  15 mg Oral QHS Patrecia Pour, NP   15 mg at 01/12/18 2120  . pantoprazole (PROTONIX) EC tablet 40 mg  40 mg Oral Daily Patrecia Pour, NP   40 mg at 01/13/18 0803  . pneumococcal 23 valent vaccine (PNU-IMMUNE) injection 0.5 mL  0.5 mL Intramuscular Tomorrow-1000 Cobos, Myer Peer, MD        Lab Results:  Results for orders placed or performed during the hospital encounter of 01/11/18 (from the past 48 hour(s))  Glucose, capillary     Status: Abnormal   Collection Time: 01/11/18  8:20 PM  Result Value Ref Range   Glucose-Capillary 234 (H) 65 - 99 mg/dL  Glucose, capillary     Status: Abnormal   Collection Time: 01/12/18 11:30 AM  Result Value Ref Range   Glucose-Capillary 190 (H) 65 - 99 mg/dL  Glucose, capillary     Status: Abnormal   Collection Time: 01/12/18  5:09 PM  Result Value Ref Range   Glucose-Capillary 242 (H) 65 - 99 mg/dL   Comment 1 Notify RN    Comment 2 Document in Chart   Glucose, capillary     Status: Abnormal   Collection Time: 01/12/18  8:18 PM  Result Value Ref Range   Glucose-Capillary 291 (H) 65 - 99 mg/dL  Glucose, capillary     Status: Abnormal   Collection Time: 01/13/18  5:42 AM  Result  Value Ref Range   Glucose-Capillary 182 (H) 65 - 99 mg/dL    Blood Alcohol level:  Lab Results  Component Value Date   ETH <10 01/10/2018   ETH <10 02/72/5366    Metabolic Disorder Labs: Lab Results  Component Value Date   HGBA1C 12.3 (H) 10/09/2015   MPG 306 10/09/2015   MPG 249 (H) 08/28/2014   No results found for: PROLACTIN Lab Results  Component Value Date   CHOL 196 08/28/2014   TRIG 129 08/28/2014   HDL 45 08/28/2014   CHOLHDL 4.4 08/28/2014   VLDL 26 08/28/2014   LDLCALC 125 (H) 08/28/2014   LDLCALC 107 (H) 09/04/2013    Physical Findings: AIMS: Facial and Oral Movements Muscles of Facial Expression: None, normal Lips and Perioral Area: None, normal Jaw: None, normal Tongue: None, normal,Extremity Movements Upper (arms, wrists, hands, fingers): None, normal Lower (legs, knees, ankles, toes): None, normal, Trunk Movements Neck, shoulders, hips: None, normal, Overall Severity Severity of abnormal movements (highest score from questions above): None, normal Incapacitation due to abnormal movements: None, normal Patient's awareness of abnormal movements (rate only patient's report): No Awareness, Dental Status Current problems with teeth and/or dentures?: No Does patient usually wear dentures?: No  CIWA:  CIWA-Ar Total: 4 COWS:  COWS Total Score: 3  Musculoskeletal: Strength & Muscle Tone: decreased Gait & Station: normal Patient leans: N/A  Psychiatric Specialty Exam: Physical Exam  Nursing note and vitals reviewed. Constitutional: She is oriented to person, place, and time. She appears well-developed and well-nourished.  Respiratory: Effort normal.  Musculoskeletal: Normal range of motion.  Neurological: She is alert and oriented to person, place, and time.    ROS  Blood pressure (!) 153/96, pulse 90, temperature 98.5 F (36.9  C), temperature source Oral, resp. rate 20, height 5\' 1"  (1.549 m), weight 99.3 kg (219 lb), SpO2 100 %.Body mass index is  41.38 kg/m.  General Appearance: Casual  Eye Contact:  Fair  Speech:  Normal Rate  Volume:  Normal  Mood:  Euthymic  Affect:  Appropriate  Thought Process:  Coherent  Orientation:  Full (Time, Place, and Person)  Thought Content:  Logical  Suicidal Thoughts:  No  Homicidal Thoughts:  No  Memory:  Immediate;   Fair  Judgement:  Intact  Insight:  Lacking  Psychomotor Activity:  Normal  Concentration:  Concentration: Fair  Recall:  [employer] of Knowledge:  Fair  Language:  Fair  Akathisia:  Negative  Handed:  Right  AIMS (if indicated):     Assets:  Communication Skills Desire for Improvement Resilience  ADL's:  Intact  Cognition:  WNL  Sleep:  Number of Hours: 6.75     Treatment Plan Summary: Daily contact with patient to assess and evaluate symptoms and progress in treatment, Medication management and Plan Patient is seen and examined.  Patient is a 47 year old female with the above-stated past psychiatric history who is seen for follow-up.  She is doing better today.  She denied suicidal ideation.  I am negative change any of her medications.  We will continue the Abilify, mirtazapine at their current dosages.  Her blood pressure still remains elevated but better today.  Her clonidine was increased to 0.3 mg p.o. 3 times daily yesterday.  I am going to increase her hydralazine slightly to see if that helps.  She is Artie sign a 72-hour letter, currently denies suicidal ideation, and we will plan on discharging her home tomorrow.  Sharma Covert, MD 01/13/2018, 10:35 AM

## 2018-01-13 NOTE — Evaluation (Signed)
Physical Therapy Evaluation Patient Details Name: Diamond Rice MRN: [id number] DOB: 1971-04-16 Today's Date: 01/13/2018   History of Present Illness  47 y.o. female patient presents as walk in at [facility].  Patient states that she was at [facility] who sent her to [facility].  Patient states that she has been having suicidal thoughts and is unable to contract for safety.  PMH of CVA with L HP, DM, HTN, HLD, substance abuse.   Clinical Impression  Pt reports she ambulates with a quad cane at baseline, she's stated she's often unsteady and has had 2 falls in the past month. Pt ambulated 180' with a rolling walker with no loss of balance. She would benefit from the increased support of a walker. Rollator recommended for home. She is safe to ambulate in halls with a walker, I left one in her room for use while at [facility]. No further PT indicated, will sign off.     Follow Up Recommendations No PT follow up    Equipment Recommendations  Other (comment)(4 wheeled rolling walker with wheels (rollator))    Recommendations for Other Services       Precautions / Restrictions Precautions Precautions: Fall Precaution Comments: reports 2 falls in past 1 month 2* LLE "giving out" Restrictions Weight Bearing Restrictions: No      Mobility  Bed Mobility               General bed mobility comments: up in WC  Transfers Overall transfer level: Modified independent               General transfer comment: used armrests  Ambulation/Gait Ambulation/Gait assistance: Supervision Ambulation Distance (Feet): 180 Feet Assistive device: Rolling walker (2 wheeled) Gait Pattern/deviations: Step-through pattern;Decreased stride length;Decreased dorsiflexion - left     General Gait Details: steady with RW, no loss of balance, pt reports she has chronic R knee pain for which she had tylenol this morning -this gave her good relief  Stairs            Wheelchair Mobility    Modified Rankin  (Stroke Patients Only)       Balance Overall balance assessment: History of Falls;Needs assistance   Sitting balance-Leahy Scale: Good       Standing balance-Leahy Scale: Fair Standing balance comment: relies on BUE support with dynamic standing balance                             Pertinent Vitals/Pain Pain Assessment: No/denies pain    Home Living Family/patient expects to be discharged to:: Private residence Living Arrangements: Non-relatives/Friends Available Help at Discharge: Friend(s);Available PRN/intermittently Type of Home: House Home Access: Level entry     Home Layout: One level Home Equipment: Cane - quad;Shower seat      Prior Function Level of Independence: Independent with assistive device(s)         Comments: walks with quad cane at baseline, but reports she's unsteady with it; has a RW but its in [place], slow but independent with ADLs, uses bus for transportation     Hand Dominance   Dominant Hand: Right    Extremity/Trunk Assessment   Upper Extremity Assessment Upper Extremity Assessment: LUE deficits/detail LUE Deficits / Details: grip -4/5, shoulder elevation ~120* AAROM LUE Sensation: WNL    Lower Extremity Assessment Lower Extremity Assessment: LLE deficits/detail LLE Deficits / Details: ankle 4/5, knee 4/5, hip flexion 3/5 LLE Sensation: WNL    Cervical /  Trunk Assessment Cervical / Trunk Assessment: Normal  Communication   Communication: No difficulties  Cognition Arousal/Alertness: Awake/alert Behavior During Therapy: WFL for tasks assessed/performed Overall Cognitive Status: Within Functional Limits for tasks assessed                                        General Comments      Exercises     Assessment/Plan    PT Assessment Patent does not need any further PT services  PT Problem List         PT Treatment Interventions      PT Goals (Current goals can be found in the Care Plan  section)  Acute Rehab PT Goals Patient Stated Goal: to be more steady with walking -would like a rollator PT Goal Formulation: All assessment and education complete, DC therapy    Frequency     Barriers to discharge        Co-evaluation               AM-PAC PT "6 Clicks" Daily Activity  Outcome Measure Difficulty turning over in bed (including adjusting bedclothes, sheets and blankets)?: None Difficulty moving from lying on back to sitting on the side of the bed? : None Difficulty sitting down on and standing up from a chair with arms (e.g., wheelchair, bedside commode, etc,.)?: None Help needed moving to and from a bed to chair (including a wheelchair)?: None Help needed walking in hospital room?: None Help needed climbing 3-5 steps with a railing? : A Lot 6 Click Score: 22    End of Session Equipment Utilized During Treatment: Gait belt Activity Tolerance: Patient tolerated treatment well Patient left: in chair Nurse Communication: Mobility status      Time: 9163-8466 PT Time Calculation (min) (ACUTE ONLY): 16 min   Charges:   PT Evaluation $PT Eval Low Complexity: 1 Low     PT G Codes:          Philomena Doheny 01/13/2018, 11:53 AM [phone]

## 2018-01-13 NOTE — Progress Notes (Signed)
D.  Pt guarded on approach, requested to sign 72 hour request for discharge.  Pt was positive for evening wrap up group, observed engaged minimally with peers on the unit.  Pt denies SI/HI/AVH at this time.  A.  Support and encouragement offered, 72 hour request for discharge provided and signed.  Medication given as ordered.  R.  Pt remains safe on the unit, utilizing wheelchair for ambulation.  Will continue to monitor.

## 2018-01-13 NOTE — ED Provider Notes (Signed)
[facility] EMERGENCY DEPARTMENT Provider Note   CSN: [id number] Arrival date & time: 01/13/18  1808     History   Chief Complaint Chief Complaint  Patient presents with  . Aphasia    HPI Diamond Rice is a 47 y.o. female.  She was seen here on the 17th and admitted to behavioral health for worsening SI.  At that point she was having a right-sided headache and she has a chronic speech and left leg deficits from prior strokes.  Today she was noted by staff to have more difficulty with her speech and seemingly more tired along with increased weakness in her left leg.  They say the symptoms started around 530.  They transferred her over to [facility] for evaluation.  The patient states her speech and her leg have been that way since yesterday some time.  She denies any new ingestions and is only been getting the medications that they gave her.  She is complaining of a moderate severity headache on the right side.  She states she has had this headache before.  Denies any blurry vision double vision shortness of breath chest pain abdominal pain.  There is been no seizure activity and no urinary incontinence.  She denies any numbness and the only weakness she appreciates is that she has had some more difficulty with her left leg when ambulating.  The history is provided by the patient and the EMS personnel.  Cerebrovascular Accident  This is a recurrent problem. The current episode started yesterday. The problem has not changed since onset.Associated symptoms include headaches. Pertinent negatives include no chest pain, no abdominal pain and no shortness of breath. Nothing aggravates the symptoms. Nothing relieves the symptoms. She has tried nothing for the symptoms. The treatment provided no relief.    Past Medical History:  Diagnosis Date  . Anginal pain ([place])   . Anxiety   . Chronic lower back pain 04/17/2012   "just got over some; catched when I walked"  . COPD  (chronic obstructive pulmonary disease) ([place])   . Depression   . Headache(784.0) 04/17/2012   "~ qod; lately waking up in am w/one"  . Heart attack ([place])   . High cholesterol   . Hypertension   . Migraines 04/17/2012  . Obesity   . Shortness of breath 04/17/2012   "all the time"  . Sleep apnea   . Stroke ([place])   . Type II diabetes mellitus ([place]) 08/28/2002    Patient Active Problem List   Diagnosis Date Noted  . Cannabis use disorder, moderate, dependence ([place]) 05/19/2017  . Cocaine use disorder, moderate, dependence ([place]) 05/19/2017  . Schizoaffective disorder, bipolar type ([place]) 10/07/2015  . OSA (obstructive sleep apnea) 06/09/2015  . Vitamin D deficiency 01/06/2015  . Bilateral knee pain 01/05/2015  . Numbness and tingling in right hand 01/05/2015  . Insomnia 12/07/2014  . Right-sided lacunar infarction ([place]) 09/15/2014  . Left hemiparesis ([place]) 09/15/2014  . Dyspnea   . Back pain 09/11/2013  . Psychoactive substance-induced organic mood disorder ([place]) 05/28/2013  . Cocaine abuse with cocaine-induced mood disorder ([place]) 05/28/2013  . Cannabis dependence with cannabis-induced anxiety disorder ([place]) 05/28/2013  . Morbid obesity with BMI of 45.0-49.9, adult ([place]) 04/25/2012  . Chest pain 04/17/2012  . Hypertension 04/17/2012  . Diabetes mellitus type 2 with complications, uncontrolled ([place]) 04/17/2012  . Hyperlipidemia 04/17/2012  . Tobacco use 04/17/2012    Past Surgical History:  Procedure Laterality Date  . CARDIAC CATHETERIZATION  ~  2011     OB History   None      Home Medications    Prior to Admission medications   Medication Sig Start Date End Date Taking? Authorizing Provider  amLODipine (NORVASC) 10 MG tablet Take 1 tablet (10 mg total) by mouth daily. For high blood pressure Patient not taking: Reported on 09/08/2017 05/27/17   Money, Lowry Ram, FNP  ARIPiprazole (ABILIFY) 15 MG tablet Take 1 tablet (15 mg total) by mouth daily. For mood control Patient  not taking: Reported on 09/08/2017 05/27/17   Money, Lowry Ram, FNP  ARIPiprazole ER 300 MG SRER Inject 300 mg into the muscle every 28 (twenty-eight) days. For mood control Patient not taking: Reported on 01/10/2018 06/21/17   Money, Lowry Ram, FNP  cloNIDine (CATAPRES) 0.2 MG tablet Take 0.2 mg by mouth 3 (three) times daily.    [provider]  hydrALAZINE (APRESOLINE) 25 MG tablet Take 1 tablet (25 mg total) by mouth every 8 (eight) hours. For high blood pressure Patient not taking: Reported on 09/08/2017 05/26/17   Money, Lowry Ram, FNP  hydrOXYzine (ATARAX/VISTARIL) 25 MG tablet Take 1 tablet (25 mg total) by mouth 3 (three) times daily as needed for anxiety. Patient not taking: Reported on 09/08/2017 05/26/17   Money, Lowry Ram, FNP  Insulin Detemir (LEVEMIR) 100 UNIT/ML Pen Inject 15 Units into the skin daily at 10 pm.    [provider]  lisinopril (PRINIVIL,ZESTRIL) 40 MG tablet Take 1 tablet (40 mg total) by mouth daily. For high blood pressure Patient not taking: Reported on 01/10/2018 05/27/17   Money, Lowry Ram, FNP    Family History Family History  Problem Relation Age of Onset  . Stroke Brother 5  . Stroke Brother 68  . Heart attack Mother 108  . Stroke Father 65    Social History Social History   Tobacco Use  . Smoking status: Current Some Day Smoker    Packs/day: 0.00    Years: 10.00    Pack years: 0.00    Types: Cigarettes    Last attempt to quit: 12/07/2014    Years since quitting: 3.1  . Smokeless tobacco: Never Used  . Tobacco comment: smoke a cigarette a day   Substance Use Topics  . Alcohol use: No    Alcohol/week: 0.0 oz    Comment: rare  . Drug use: Yes    Types: Marijuana, "Crack" cocaine     Allergies   Morphine and related   Review of Systems Review of Systems  Constitutional: Negative for chills and fever.  HENT: Negative for ear pain and sore throat.   Eyes: Negative for pain and visual disturbance.  Respiratory: Negative for  chest tightness and shortness of breath.   Cardiovascular: Negative for chest pain and palpitations.  Gastrointestinal: Negative for abdominal pain, nausea and vomiting.  Genitourinary: Negative for dysuria.  Musculoskeletal: Negative for back pain and neck pain.  Skin: Negative for rash.  Neurological: Positive for speech difficulty, weakness and headaches. Negative for seizures and numbness.     Physical Exam Updated Vital Signs BP (!) 133/92 (BP Location: Left Arm)   Pulse 64   Temp 98.5 F (36.9 C) (Oral)   Resp 16   Ht 5' (1.524 m)   Wt 99.8 kg (220 lb)   LMP  (LMP Unknown)   SpO2 100%   BMI 42.97 kg/m   Physical Exam  Constitutional: She appears well-developed and well-nourished. No distress.  HENT:  Head: Normocephalic and atraumatic.  Eyes: Conjunctivae are normal.  Neck: Neck supple.  Cardiovascular: Normal rate and regular rhythm.  No murmur heard. Pulmonary/Chest: Effort normal and breath sounds normal. No respiratory distress.  Abdominal: Soft. There is no tenderness.  Musculoskeletal: She exhibits no edema.  Neurological: She is alert.  Your exam is little bit difficult due to her level of cooperation.  She is speaking very quietly and slowly but does not seem dysarthric at all.  I did not see any obvious cranial nerve deficits.  She is got no upper extremity weakness or drift.  On her lower extremity she is giving less effort and seems a little bit weaker in the left leg but has good foot flexion and and weekend extension on the left.  Intact sensation.  Skin: Skin is warm and dry.  Psychiatric: She has a normal mood and affect.  Nursing note and vitals reviewed.    ED Treatments / Results  Labs (all labs ordered are listed, but only abnormal results are displayed) Labs Reviewed  GLUCOSE, CAPILLARY - Abnormal; Notable for the following components:      Result Value   Glucose-Capillary 234 (*)    All other components within normal limits  GLUCOSE,  CAPILLARY - Abnormal; Notable for the following components:   Glucose-Capillary 190 (*)    All other components within normal limits  GLUCOSE, CAPILLARY - Abnormal; Notable for the following components:   Glucose-Capillary 242 (*)    All other components within normal limits  GLUCOSE, CAPILLARY - Abnormal; Notable for the following components:   Glucose-Capillary 291 (*)    All other components within normal limits  GLUCOSE, CAPILLARY - Abnormal; Notable for the following components:   Glucose-Capillary 182 (*)    All other components within normal limits  GLUCOSE, CAPILLARY - Abnormal; Notable for the following components:   Glucose-Capillary 256 (*)    All other components within normal limits  GLUCOSE, CAPILLARY - Abnormal; Notable for the following components:   Glucose-Capillary 276 (*)    All other components within normal limits  ETHANOL  PROTIME-INR  APTT  CBC  DIFFERENTIAL  COMPREHENSIVE METABOLIC PANEL  RAPID URINE DRUG SCREEN, HOSP PERFORMED  URINALYSIS, ROUTINE W REFLEX MICROSCOPIC  I-STAT TROPONIN, ED  I-STAT BETA HCG BLOOD, ED ([facility], [facility], [facility] ONLY)    EKG None  Radiology Ct Head Wo Contrast  Result Date: 01/13/2018 CLINICAL DATA:  Slurred speech and LEFT frontal headache. Last seen normal at 5:30 p.m. History of stroke with LEFT-sided weakness. History of migraines, diabetes, mild cardial infarction, hyperlipidemia. EXAM: CT HEAD WITHOUT CONTRAST TECHNIQUE: Contiguous axial images were obtained from the base of the skull through the vertex without intravenous contrast. COMPARISON:  MRI of the head September 08, 2014 FINDINGS: BRAIN: No intraparenchymal hemorrhage, mass effect nor midline shift. Old bilateral basal ganglia and LEFT thalamus small infarcts. Patchy supratentorial white matter hypodensities. Mild ex vacuo dilatation lateral ventricles without overall parenchymal brain volume loss for age. No hydrocephalus. No abnormal extra-axial fluid collections. Basal  cisterns are patent. VASCULAR: Mild calcific atherosclerosis carotid siphons. Mastoid air cells are well aerated. SKULL/SOFT TISSUES: No skull fracture. No significant soft tissue swelling. ORBITS/SINUSES: The included ocular globes and orbital contents are normal.The mastoid aircells and included paranasal sinuses are well-aerated. OTHER: None. IMPRESSION: 1. No acute intracranial process. 2. Old basal ganglia and LEFT thalamus lacunar infarcts. Moderate white matter changes most compatible with chronic small vessel ischemic disease though there could be a component of chronic demyelination. Electronically Signed   By:  Elon Alas M.D.   On: 01/13/2018 19:39   Mr Brain Wo Contrast  Result Date: 01/14/2018 CLINICAL DATA:  Initial evaluation for increased difficulty with speech with worsened left leg weakness. History of prior stroke. EXAM: MRI HEAD WITHOUT CONTRAST TECHNIQUE: Multiplanar, multiecho pulse sequences of the brain and surrounding structures were obtained without intravenous contrast. COMPARISON:  Prior CT from 01/13/2018 as well as previous MRI from 09/08/2014. FINDINGS: Brain: Generalized age-related cerebral atrophy. Patchy and confluent T2/FLAIR hyperintensity within the periventricular, and deep white matter both cerebral hemispheres, most consistent with chronic small vessel ischemic change, mildly progressed relative to 2016. Superimposed remote lacunar infarcts present within the bilateral basal ganglia/corona radiata as well as the left thalamus. No abnormal foci of restricted diffusion to suggest acute or subacute ischemia. Gray-white matter differentiation maintained. No areas of remote cortical infarction. No acute intracranial hemorrhage. Multiple scattered chronic micro hemorrhages seen involving the bilateral cerebral and left cerebellar hemisphere, most notable about the deep gray nuclei, most like related to chronic underlying hypertension. No mass lesion, midline shift or mass  effect. No hydrocephalus. No extra-axial fluid collection. Major dural sinuses are patent. Pituitary gland suprasellar region normal. Midline structures intact and normal. Vascular: Major intracranial vascular flow voids are maintained at the skull base. Skull and upper cervical spine: Craniocervical junction within normal limits. Upper cervical spine demonstrates no acute finding. Bone marrow signal intensity somewhat diffusely decreased on T1 weighted imaging, most commonly related to anemia, smoking, or obesity. No scalp soft tissue abnormality. Sinuses/Orbits: Globes and orbital soft tissues within normal limits. Paranasal sinuses are largely clear. No mastoid effusion. Inner ear structures normal. Other: None. IMPRESSION: 1. No acute intracranial infarct or other abnormality identified. 2. Moderate chronic small vessel ischemic disease with scattered remote lacunar infarcts involving the bilateral basal ganglia/corona radiata and left thalamus. 3. Multiple chronic micro hemorrhages involving the supratentorial and infratentorial brain, most likely related to chronic underlying hypertension. Electronically Signed   By: Jeannine Boga M.D.   On: 01/14/2018 03:28    Procedures Procedures (including critical care time)  Medications Ordered in ED Medications  albuterol (PROVENTIL HFA;VENTOLIN HFA) 108 (90 Base) MCG/ACT inhaler 2 puff (has no administration in time range)  amLODipine (NORVASC) tablet 10 mg (10 mg Oral Given 01/13/18 0803)  insulin detemir (LEVEMIR) injection 15 Units (15 Units Subcutaneous Given 01/12/18 2120)  lisinopril (PRINIVIL,ZESTRIL) tablet 40 mg (40 mg Oral Given 01/13/18 0803)  mirtazapine (REMERON) tablet 15 mg (15 mg Oral Given 01/12/18 2120)  pantoprazole (PROTONIX) EC tablet 40 mg (40 mg Oral Given 01/13/18 0803)  acetaminophen (TYLENOL) tablet 650 mg (650 mg Oral Given 01/13/18 0802)  alum & mag hydroxide-simeth (MAALOX/MYLANTA) 200-200-20 MG/5ML suspension 30 mL (has no  administration in time range)  magnesium hydroxide (MILK OF MAGNESIA) suspension 30 mL (has no administration in time range)  pneumococcal 23 valent vaccine (PNU-IMMUNE) injection 0.5 mL (0.5 mLs Intramuscular Not Given 01/12/18 0917)  cloNIDine (CATAPRES) tablet 0.3 mg (0.3 mg Oral Not Given 01/13/18 1751)  hydrALAZINE (APRESOLINE) tablet 50 mg (50 mg Oral Given 01/13/18 1524)  ARIPiprazole (ABILIFY) tablet 15 mg (has no administration in time range)  cloNIDine (CATAPRES) tablet 0.1 mg (0.1 mg Oral Given 01/12/18 1004)     Initial Impression / Assessment and Plan / ED Course  I have reviewed the triage vital signs and the nursing notes.  Pertinent labs & imaging results that were available during my care of the patient were reviewed by me and considered in my medical  decision making (see chart for details).  Clinical Course as of Jan 15 1108  Sun Jan 13, 2018  1957 discussed with Dr. Leonel Ramsay neurology who recommends getting a noncontrast MRI brain.  He felt that likely with her new addition of medications it might unmasked her  old stroke area symptoms   [MB]    Clinical Course User Index [MB] Hayden Rasmussen, MD     Final Clinical Impressions(s) / ED Diagnoses   Final diagnoses:  Slurred speech    ED Discharge Orders    None       Hayden Rasmussen, MD 01/14/18 [phone]

## 2018-01-13 NOTE — Plan of Care (Signed)
Patient attended some group therapy sessions, minimal participation. Minimal goal setting. Patient still very depressed, guarded.

## 2018-01-13 NOTE — Progress Notes (Signed)
D: Patient came to RN after speaking on the phone with a family member. She c/o increasingly difficult speech, including slurring of her words. When asked to smile and grimace, she was unable to raise the corner of the left side of her mouth. She does have left sided hemiparesis (weakness) to left upper and lower extremities, with difficulty making a full smile. Her speech was notably more slurred than 30 minutes prior during group therapy session. Onset of symptoms 1730. A: Obtained vitals 130/87, pulse 71. Notified ED Charge and Provider. Called 911 and updated EMS when they arrived.  R: Patient tearful and anxious. Patient transported to ED via EMS.

## 2018-01-13 NOTE — Progress Notes (Signed)
D: Patient presents depressed, angry, apathetic to plan of care. Patient has minimal interaction with staff and assessment. Patient frequently isolates to room. Elevated blood pressures noted, provider aware and adjusting meds. PT consulted and provided walker in place of wheelchair. Patient now more mobile. Patient reports poor sleep, and not taking sleep medication. Appetite is fair, concentration is good. Depression 5/10 and anxiety 7/10. Reports pain in right knee 8/10. Patient denies SI/HI/AVH.  A: Patient checked q15 min, and checks reviewed. Reviewed medication changes with patient and educated on side effects. Educated patient on importance of attending group therapy sessions and educated on several coping skills. Encouarged participation in milieu through recreation therapy and attending meals with peers. Administered ibuprofen for pain.  R: Patient receptive to education on medications, and is medication compliant. Patient contracts for safety on the unit. Goal: "getting better" and "go to group" to meet goal.

## 2018-01-14 ENCOUNTER — Inpatient Hospital Stay (HOSPITAL_COMMUNITY): Payer: Medicaid Other

## 2018-01-14 DIAGNOSIS — F329 Major depressive disorder, single episode, unspecified: Secondary | ICD-10-CM

## 2018-01-14 DIAGNOSIS — I69354 Hemiplegia and hemiparesis following cerebral infarction affecting left non-dominant side: Secondary | ICD-10-CM

## 2018-01-14 DIAGNOSIS — F1210 Cannabis abuse, uncomplicated: Secondary | ICD-10-CM

## 2018-01-14 DIAGNOSIS — F1410 Cocaine abuse, uncomplicated: Secondary | ICD-10-CM

## 2018-01-14 DIAGNOSIS — Z720 Tobacco use: Secondary | ICD-10-CM

## 2018-01-14 DIAGNOSIS — G4700 Insomnia, unspecified: Secondary | ICD-10-CM

## 2018-01-14 LAB — GLUCOSE, CAPILLARY
GLUCOSE-CAPILLARY: 164 mg/dL — AB (ref 65–99)
Glucose-Capillary: 182 mg/dL — ABNORMAL HIGH (ref 65–99)
Glucose-Capillary: 237 mg/dL — ABNORMAL HIGH (ref 65–99)

## 2018-01-14 NOTE — Tx Team (Signed)
Interdisciplinary Treatment and Diagnostic Plan Update  01/14/2018 Time of Session: 10:10am Diamond Rice MRN: [id number]  Principal Diagnosis: <principal problem not specified>  Secondary Diagnoses: Active Problems:   Schizoaffective disorder, bipolar type (HCC)   Current Medications:  Current Facility-Administered Medications  Medication Dose Route Frequency Provider Last Rate Last Dose  . acetaminophen (TYLENOL) tablet 650 mg  650 mg Oral Q6H PRN Patrecia Pour, NP   650 mg at 01/13/18 0802  . albuterol (PROVENTIL HFA;VENTOLIN HFA) 108 (90 Base) MCG/ACT inhaler 2 puff  2 puff Inhalation Q4H PRN Patrecia Pour, NP      . alum & mag hydroxide-simeth (MAALOX/MYLANTA) 200-200-20 MG/5ML suspension 30 mL  30 mL Oral Q4H PRN Patrecia Pour, NP      . amLODipine (NORVASC) tablet 10 mg  10 mg Oral Daily Patrecia Pour, NP   10 mg at 01/14/18 0827  . ARIPiprazole (ABILIFY) tablet 15 mg  15 mg Oral QHS Sharma Covert, MD      . cloNIDine (CATAPRES) tablet 0.3 mg  0.3 mg Oral TID Sharma Covert, MD   0.3 mg at 01/14/18 0827  . hydrALAZINE (APRESOLINE) tablet 50 mg  50 mg Oral Q8H Sharma Covert, MD   50 mg at 01/14/18 0630  . insulin detemir (LEVEMIR) injection 15 Units  15 Units Subcutaneous Q2200 Patrecia Pour, NP   15 Units at 01/13/18 2329  . lisinopril (PRINIVIL,ZESTRIL) tablet 40 mg  40 mg Oral Daily Patrecia Pour, NP   40 mg at 01/14/18 0827  . magnesium hydroxide (MILK OF MAGNESIA) suspension 30 mL  30 mL Oral Daily PRN Patrecia Pour, NP      . mirtazapine (REMERON) tablet 15 mg  15 mg Oral QHS Patrecia Pour, NP   15 mg at 01/12/18 2120  . pantoprazole (PROTONIX) EC tablet 40 mg  40 mg Oral Daily Patrecia Pour, NP   40 mg at 01/14/18 8882  . pneumococcal 23 valent vaccine (PNU-IMMUNE) injection 0.5 mL  0.5 mL Intramuscular Tomorrow-1000 Cobos, Myer Peer, MD       PTA Medications: Medications Prior to Admission  Medication Sig Dispense Refill Last Dose  .  amLODipine (NORVASC) 10 MG tablet Take 1 tablet (10 mg total) by mouth daily. For high blood pressure (Patient not taking: Reported on 09/08/2017) 30 tablet 0 More than a month at Unknown time  . ARIPiprazole (ABILIFY) 15 MG tablet Take 1 tablet (15 mg total) by mouth daily. For mood control (Patient not taking: Reported on 09/08/2017) 10 tablet 0 More than a month at Unknown time  . ARIPiprazole ER 300 MG SRER Inject 300 mg into the muscle every 28 (twenty-eight) days. For mood control (Patient not taking: Reported on 01/10/2018) 1 each 0 More than a month at Unknown time  . cloNIDine (CATAPRES) 0.2 MG tablet Take 0.2 mg by mouth 3 (three) times daily.   More than a month at Unknown time  . hydrALAZINE (APRESOLINE) 25 MG tablet Take 1 tablet (25 mg total) by mouth every 8 (eight) hours. For high blood pressure (Patient not taking: Reported on 09/08/2017) 90 tablet 0 More than a month at Unknown time  . hydrOXYzine (ATARAX/VISTARIL) 25 MG tablet Take 1 tablet (25 mg total) by mouth 3 (three) times daily as needed for anxiety. (Patient not taking: Reported on 09/08/2017) 30 tablet 0 More than a month at Unknown time  . Insulin Detemir (LEVEMIR) 100 UNIT/ML Pen Inject 15 Units into the  skin daily at 10 pm.   01/09/2018 at Unknown time  . lisinopril (PRINIVIL,ZESTRIL) 40 MG tablet Take 1 tablet (40 mg total) by mouth daily. For high blood pressure (Patient not taking: Reported on 01/10/2018) 30 tablet 0 Not Taking at Unknown time    Patient Stressors: Financial difficulties Health problems Legal issue  Patient Strengths: Ability for insight Active sense of humor Average or above average intelligence  Treatment Modalities: Medication Management, Group therapy, Case management,  1 to 1 session with clinician, Psychoeducation, Recreational therapy.   Physician Treatment Plan for Primary Diagnosis: <principal problem not specified> Long Term Goal(s): Improvement in symptoms so as ready for  discharge Improvement in symptoms so as ready for discharge   Short Term Goals: Ability to identify changes in lifestyle to reduce recurrence of condition will improve Ability to verbalize feelings will improve Ability to disclose and discuss suicidal ideas Ability to demonstrate self-control will improve Ability to identify and develop effective coping behaviors will improve Ability to maintain clinical measurements within normal limits will improve Compliance with prescribed medications will improve Ability to identify triggers associated with substance abuse/mental health issues will improve Ability to identify changes in lifestyle to reduce recurrence of condition will improve Ability to verbalize feelings will improve Ability to disclose and discuss suicidal ideas Ability to demonstrate self-control will improve Ability to identify and develop effective coping behaviors will improve Ability to maintain clinical measurements within normal limits will improve Compliance with prescribed medications will improve Ability to identify triggers associated with substance abuse/mental health issues will improve  Medication Management: Evaluate patient's response, side effects, and tolerance of medication regimen.  Therapeutic Interventions: 1 to 1 sessions, Unit Group sessions and Medication administration.  Evaluation of Outcomes: Not Met  Physician Treatment Plan for Secondary Diagnosis: Active Problems:   Schizoaffective disorder, bipolar type ([place])  Long Term Goal(s): Improvement in symptoms so as ready for discharge Improvement in symptoms so as ready for discharge   Short Term Goals: Ability to identify changes in lifestyle to reduce recurrence of condition will improve Ability to verbalize feelings will improve Ability to disclose and discuss suicidal ideas Ability to demonstrate self-control will improve Ability to identify and develop effective coping behaviors will  improve Ability to maintain clinical measurements within normal limits will improve Compliance with prescribed medications will improve Ability to identify triggers associated with substance abuse/mental health issues will improve Ability to identify changes in lifestyle to reduce recurrence of condition will improve Ability to verbalize feelings will improve Ability to disclose and discuss suicidal ideas Ability to demonstrate self-control will improve Ability to identify and develop effective coping behaviors will improve Ability to maintain clinical measurements within normal limits will improve Compliance with prescribed medications will improve Ability to identify triggers associated with substance abuse/mental health issues will improve     Medication Management: Evaluate patient's response, side effects, and tolerance of medication regimen.  Therapeutic Interventions: 1 to 1 sessions, Unit Group sessions and Medication administration.  Evaluation of Outcomes: Not Met   RN Treatment Plan for Primary Diagnosis: <principal problem not specified> Long Term Goal(s): Knowledge of disease and therapeutic regimen to maintain health will improve  Short Term Goals: Ability to verbalize frustration and anger appropriately will improve, Ability to demonstrate self-control, Ability to participate in decision making will improve, Ability to verbalize feelings will improve, Ability to disclose and discuss suicidal ideas, Ability to identify and develop effective coping behaviors will improve and Compliance with prescribed medications will improve  Medication Management:  RN will administer medications as ordered by provider, will assess and evaluate patient's response and provide education to patient for prescribed medication. RN will report any adverse and/or side effects to prescribing provider.  Therapeutic Interventions: 1 on 1 counseling sessions, Psychoeducation, Medication administration,  Evaluate responses to treatment, Monitor vital signs and CBGs as ordered, Perform/monitor CIWA, COWS, AIMS and Fall Risk screenings as ordered, Perform wound care treatments as ordered.  Evaluation of Outcomes: Not Met   LCSW Treatment Plan for Primary Diagnosis: <principal problem not specified> Long Term Goal(s): Safe transition to appropriate next level of care at discharge, Engage patient in therapeutic group addressing interpersonal concerns.  Short Term Goals: Engage patient in aftercare planning with referrals and resources, Increase social support, Increase ability to appropriately verbalize feelings, Increase emotional regulation, Facilitate acceptance of mental health diagnosis and concerns, Facilitate patient progression through stages of change regarding substance use diagnoses and concerns, Identify triggers associated with mental health/substance abuse issues and Increase skills for wellness and recovery  Therapeutic Interventions: Assess for all discharge needs, 1 to 1 time with Social worker, Explore available resources and support systems, Assess for adequacy in community support network, Educate family and significant other(s) on suicide prevention, Complete Psychosocial Assessment, Interpersonal group therapy.  Evaluation of Outcomes: Not Met   Progress in Treatment: Attending groups: Yes. Participating in groups: No. Taking medication as prescribed: Yes. Toleration medication: Yes. Family/Significant other contact made: No, will contact:  patient refused consent at this time Patient understands diagnosis: Yes. Discussing patient identified problems/goals with staff: Yes. Medical problems stabilized or resolved: Yes. Denies suicidal/homicidal ideation: Yes. Issues/concerns per patient self-inventory: No. Other:   New problem(s) identified: None   New Short Term/Long Term Goal(s):medication stabilization, elimination of SI thoughts, development of comprehensive mental  wellness plan.   Patient Goals: "to get better"  Discharge Plan or Barriers: Patient plans to return home and follow up with [facility] for medication management and therapy services.   Reason for Continuation of Hospitalization: Depression Medication stabilization  Estimated Length of Stay: Wednesday, 01/16/18  Attendees: Patient: Diamond Rice  01/14/2018 8:40 AM  Physician: Dr. Neita Garnet, MD 01/14/2018 8:40 AM  Nursing: Rise [email or web address].Raliegh Ip, RN 01/14/2018 8:40 AM  RN Care Manager:X 01/14/2018 8:40 AM  Social Worker: Radonna Ricker, [place] 01/14/2018 8:40 AM  Recreational Therapist: Rhunette Croft 01/14/2018 8:40 AM  Other: X 01/14/2018 8:40 AM  Other: X 01/14/2018 8:40 AM  Other:X 01/14/2018 8:40 AM    Scribe for Treatment Team: Marylee Floras, [place] 01/14/2018 8:40 AM

## 2018-01-14 NOTE — Progress Notes (Signed)
[facility] MD Progress Note  01/14/2018 3:02 PM Diamond Rice  MRN:  [id number] Subjective:  Patient reports she is feeling better today.  Denies medication side effects, denies suicidal ideations . Objective : I have discussed case with treatment team and have met with patient. 47 year old female, single, lives with a friend, on disability. History of Schizoaffective Disorder. History of prior admission to [facility] in 04/2017, at which time was managed with Abilify, Remeron, Zoloft  Presented to ED due to worsening depression, suicidal ideations. Stressors include recent mother's day, which she states is hard for her as it reminds her of her mother and miscarriages she has had, and recent death of a nephew. Of note, patient has a history of CVA and yesterday was transported to ED due to concerns she was dysarthric/slurring words. Head CT scan/ MRI  negative for acute changes.  At present states she is feeling partially better, less depressed . Of note, currently not exhibiting slurred speech. Denies SI at this time. Limited milieu participation at this time. Denies medication side effects.  Principal Problem: Schizoaffective Disorder by history  Diagnosis:   Patient Active Problem List   Diagnosis Date Noted  . Cannabis use disorder, moderate, dependence ([place]) [F12.20] 05/19/2017  . Cocaine use disorder, moderate, dependence ([place]) [F14.20] 05/19/2017  . Schizoaffective disorder, bipolar type ([place]) [F25.0] 10/07/2015  . OSA (obstructive sleep apnea) [G47.33] 06/09/2015  . Vitamin D deficiency [E55.9] 01/06/2015  . Bilateral knee pain [M25.561, M25.562] 01/05/2015  . Numbness and tingling in right hand [R20.0, R20.2] 01/05/2015  . Insomnia [G47.00] 12/07/2014  . Right-sided lacunar infarction ([place]) [I63.81] 09/15/2014  . Left hemiparesis ([place]) [G81.94] 09/15/2014  . Dyspnea [R06.00]   . Back pain [M54.9] 09/11/2013  . Psychoactive substance-induced organic mood disorder ([place]) [R15.40, F06.30]  05/28/2013  . Cocaine abuse with cocaine-induced mood disorder ([place]) [F14.14] 05/28/2013  . Cannabis dependence with cannabis-induced anxiety disorder ([place]) [F12.280] 05/28/2013  . Morbid obesity with BMI of 45.0-49.9, adult ([place]) [E66.01, Z68.42] 04/25/2012  . Chest pain [R07.9] 04/17/2012  . Hypertension [I10] 04/17/2012  . Diabetes mellitus type 2 with complications, uncontrolled ([place]) [E11.8, E11.65] 04/17/2012  . Hyperlipidemia [E78.5] 04/17/2012  . Tobacco use [Z72.0] 04/17/2012   Total Time spent with patient: 20 minutes  Past Psychiatric History:  Past Medical History:  Past Medical History:  Diagnosis Date  . Anginal pain ([place])   . Anxiety   . Chronic lower back pain 04/17/2012   "just got over some; catched when I walked"  . COPD (chronic obstructive pulmonary disease) ([place])   . Depression   . Headache(784.0) 04/17/2012   "~ qod; lately waking up in am w/one"  . Heart attack ([place])   . High cholesterol   . Hypertension   . Migraines 04/17/2012  . Obesity   . Shortness of breath 04/17/2012   "all the time"  . Sleep apnea   . Stroke ([place])   . Type II diabetes mellitus ([place]) 08/28/2002    Past Surgical History:  Procedure Laterality Date  . CARDIAC CATHETERIZATION  ~ 2011   Family History:  Family History  Problem Relation Age of Onset  . Stroke Brother 54  . Stroke Brother 90  . Heart attack Mother 57  . Stroke Father 61   Family Psychiatric  History:  Social History:  Social History   Substance and Sexual Activity  Alcohol Use No  . Alcohol/week: 0.0 oz   Comment: rare     Social History   Substance  and Sexual Activity  Drug Use Yes  . Types: Marijuana, "Crack" cocaine    Social History   Socioeconomic History  . Marital status: Single    Spouse name: Not on file  . Number of children: Not on file  . Years of education: Not on file  . Highest education level: Not on file  Occupational History  . Not on file  Social Needs  . Financial  resource strain: Not on file  . Food insecurity:    Worry: Not on file    Inability: Not on file  . Transportation needs:    Medical: Not on file    Non-medical: Not on file  Tobacco Use  . Smoking status: Current Some Day Smoker    Packs/day: 0.00    Years: 10.00    Pack years: 0.00    Types: Cigarettes    Last attempt to quit: 12/07/2014    Years since quitting: 3.1  . Smokeless tobacco: Never Used  . Tobacco comment: smoke a cigarette a day   Substance and Sexual Activity  . Alcohol use: No    Alcohol/week: 0.0 oz    Comment: rare  . Drug use: Yes    Types: Marijuana, "Crack" cocaine  . Sexual activity: Not Currently  Lifestyle  . Physical activity:    Days per week: Not on file    Minutes per session: Not on file  . Stress: Not on file  Relationships  . Social connections:    Talks on phone: Not on file    Gets together: Not on file    Attends religious service: Not on file    Active member of club or organization: Not on file    Attends meetings of clubs or organizations: Not on file    Relationship status: Not on file  Other Topics Concern  . 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   Additional Social History:    Pain Medications: denies Prescriptions: abilify, remeron Over the Counter: denies History of alcohol / drug use?: Yes Name of Substance 1: cocaine 1 - Last Use / Amount: "a while ago" Name of Substance 2: marijuana 2 - Age of First Use: 17 2 - Amount (size/oz): varies 2 - Frequency: daily 2 - Duration: ongoing 2 - Last Use / Amount: last night  Sleep: Fair  Appetite:  Fair  Current Medications: Current Facility-Administered Medications  Medication Dose Route Frequency Provider Last Rate Last Dose  . acetaminophen (TYLENOL) tablet 650 mg  650 mg Oral Q6H PRN Patrecia Pour, NP   650 mg at 01/13/18 0802  . albuterol (PROVENTIL HFA;VENTOLIN HFA) 108 (90 Base) MCG/ACT inhaler 2 puff   2 puff Inhalation Q4H PRN Patrecia Pour, NP      . alum & mag hydroxide-simeth (MAALOX/MYLANTA) 200-200-20 MG/5ML suspension 30 mL  30 mL Oral Q4H PRN Patrecia Pour, NP      . amLODipine (NORVASC) tablet 10 mg  10 mg Oral Daily Patrecia Pour, NP   10 mg at 01/14/18 0827  . ARIPiprazole (ABILIFY) tablet 15 mg  15 mg Oral QHS Sharma Covert, MD      . cloNIDine (CATAPRES) tablet 0.3 mg  0.3 mg Oral TID Sharma Covert, MD   0.3 mg at 01/14/18 1230  . hydrALAZINE (APRESOLINE) tablet 50 mg  50 mg Oral Q8H Sharma Covert, MD   50 mg at 01/14/18 1309  .  insulin detemir (LEVEMIR) injection 15 Units  15 Units Subcutaneous Q2200 Patrecia Pour, NP   15 Units at 01/13/18 2329  . lisinopril (PRINIVIL,ZESTRIL) tablet 40 mg  40 mg Oral Daily Patrecia Pour, NP   40 mg at 01/14/18 0827  . magnesium hydroxide (MILK OF MAGNESIA) suspension 30 mL  30 mL Oral Daily PRN Patrecia Pour, NP      . mirtazapine (REMERON) tablet 15 mg  15 mg Oral QHS Patrecia Pour, NP   15 mg at 01/12/18 2120  . pantoprazole (PROTONIX) EC tablet 40 mg  40 mg Oral Daily Patrecia Pour, NP   40 mg at 01/14/18 9678  . pneumococcal 23 valent vaccine (PNU-IMMUNE) injection 0.5 mL  0.5 mL Intramuscular Tomorrow-1000 [place] Cermak, Myer Peer, MD        Lab Results:  Results for orders placed or performed during the hospital encounter of 01/11/18 (from the past 48 hour(s))  Glucose, capillary     Status: Abnormal   Collection Time: 01/12/18  5:09 PM  Result Value Ref Range   Glucose-Capillary 242 (H) 65 - 99 mg/dL   Comment 1 Notify RN    Comment 2 Document in Chart   Glucose, capillary     Status: Abnormal   Collection Time: 01/12/18  8:18 PM  Result Value Ref Range   Glucose-Capillary 291 (H) 65 - 99 mg/dL  Glucose, capillary     Status: Abnormal   Collection Time: 01/13/18  5:42 AM  Result Value Ref Range   Glucose-Capillary 182 (H) 65 - 99 mg/dL  Glucose, capillary     Status: Abnormal   Collection Time: 01/13/18  12:17 PM  Result Value Ref Range   Glucose-Capillary 256 (H) 65 - 99 mg/dL  Glucose, capillary     Status: Abnormal   Collection Time: 01/13/18  5:02 PM  Result Value Ref Range   Glucose-Capillary 276 (H) 65 - 99 mg/dL  Ethanol     Status: None   Collection Time: 01/13/18  6:39 PM  Result Value Ref Range   Alcohol, Ethyl (B) <10 <10 mg/dL    Comment: (NOTE) Lowest detectable limit for serum alcohol is 10 mg/dL. For medical purposes only. Performed at [facility], [place] [name], [place], [place] [address]   Protime-INR     Status: None   Collection Time: 01/13/18  6:39 PM  Result Value Ref Range   Prothrombin Time 12.9 11.4 - 15.2 seconds   INR 0.98     Comment: Performed at [facility] [address], [place], [place] [address]  APTT     Status: None   Collection Time: 01/13/18  6:39 PM  Result Value Ref Range   aPTT 32 24 - 36 seconds    Comment: Performed at [facility] [address], [place], [place] [address]  CBC     Status: Abnormal   Collection Time: 01/13/18  6:39 PM  Result Value Ref Range   WBC 7.0 4.0 - 10.5 K/uL   RBC 4.88 3.87 - 5.11 MIL/uL   Hemoglobin 10.9 (L) 12.0 - 15.0 g/dL   HCT 35.1 (L) 36.0 - 46.0 %   MCV 71.9 (L) 78.0 - 100.0 fL   MCH 22.3 (L) 26.0 - 34.0 pg   MCHC 31.1 30.0 - 36.0 g/dL   RDW 15.8 (H) 11.5 - 15.5 %   Platelets 345 150 - 400 K/uL    Comment: Performed at [facility], [address] [place], [place] [address]  Differential     Status: None   Collection Time: 01/13/18  6:39 PM  Result Value Ref Range   Neutrophils Relative % 42 %   Neutro Abs 2.9 1.7 - 7.7 K/uL   Lymphocytes Relative 47 %   Lymphs Abs 3.3 0.7 - 4.0 K/uL   Monocytes Relative 8 %   Monocytes Absolute 0.5 0.1 - 1.0 K/uL   Eosinophils Relative 3 %   Eosinophils Absolute 0.2 0.0 - 0.7 K/uL   Basophils Relative 0 %   Basophils Absolute 0.0 0.0 - 0.1 K/uL   Immature Granulocytes 0 %   Abs Immature Granulocytes 0.0 0.0 - 0.1 K/uL     Comment: Performed at [facility] [address] [place], [place] [address]  Comprehensive metabolic panel     Status: Abnormal   Collection Time: 01/13/18  6:39 PM  Result Value Ref Range   Sodium 137 135 - 145 mmol/L   Potassium 3.5 3.5 - 5.1 mmol/L   Chloride 104 101 - 111 mmol/L   CO2 27 22 - 32 mmol/L   Glucose, Bld 227 (H) 65 - 99 mg/dL   BUN 14 6 - 20 mg/dL   Creatinine, Ser 1.41 (H) 0.44 - 1.00 mg/dL   Calcium 8.9 8.9 - 10.3 mg/dL   Total Protein 6.6 6.5 - 8.1 g/dL   Albumin 2.9 (L) 3.5 - 5.0 g/dL   AST 11 (L) 15 - 41 U/L   ALT 7 (L) 14 - 54 U/L   Alkaline Phosphatase 62 38 - 126 U/L   Total Bilirubin 0.5 0.3 - 1.2 mg/dL   GFR calc non Af Amer 44 (L) >60 mL/min   GFR calc Af Amer 51 (L) >60 mL/min    Comment: (NOTE) The eGFR has been calculated using the CKD EPI equation. This calculation has not been validated in all clinical situations. eGFR's persistently <60 mL/min signify possible Chronic Kidney Disease.    Anion gap 6 5 - 15    Comment: Performed at [facility] [address] [place], [place] [address]  I-stat troponin, ED     Status: None   Collection Time: 01/13/18  6:46 PM  Result Value Ref Range   Troponin i, poc 0.01 0.00 - 0.08 ng/mL   Comment 3            Comment: Due to the release kinetics of cTnI, a negative result within the first hours of the onset of symptoms does not rule out myocardial infarction with certainty. If myocardial infarction is still suspected, repeat the test at appropriate intervals.   I-Stat beta hCG blood, ED     Status: None   Collection Time: 01/13/18  6:46 PM  Result Value Ref Range   I-stat hCG, quantitative <5.0 <5 mIU/mL   Comment 3            Comment:   GEST. AGE      CONC.  (mIU/mL)   <=1 WEEK        5 - 50     2 WEEKS       50 - 500     3 WEEKS       100 - 10,000     4 WEEKS     1,000 - 30,000        FEMALE AND NON-PREGNANT FEMALE:     LESS THAN 5 mIU/mL   Urine rapid drug screen (hosp  performed)  Status: Abnormal   Collection Time: 01/13/18  8:09 PM  Result Value Ref Range   Opiates NONE DETECTED NONE DETECTED   Cocaine NONE DETECTED NONE DETECTED   Benzodiazepines NONE DETECTED NONE DETECTED   Amphetamines NONE DETECTED NONE DETECTED   Tetrahydrocannabinol POSITIVE (A) NONE DETECTED   Barbiturates NONE DETECTED NONE DETECTED    Comment: (NOTE) DRUG SCREEN FOR MEDICAL PURPOSES ONLY.  IF CONFIRMATION IS NEEDED FOR ANY PURPOSE, NOTIFY LAB WITHIN 5 DAYS. LOWEST DETECTABLE LIMITS FOR URINE DRUG SCREEN Drug Class                     Cutoff (ng/mL) Amphetamine and metabolites    1000 Barbiturate and metabolites    200 Benzodiazepine                 287 Tricyclics and metabolites     300 Opiates and metabolites        300 Cocaine and metabolites        300 THC                            50 Performed at [facility], [place] [address] [place], [place] [address]   Urinalysis, Routine w reflex microscopic     Status: Abnormal   Collection Time: 01/13/18  8:09 PM  Result Value Ref Range   Color, Urine YELLOW YELLOW   APPearance CLEAR CLEAR   Specific Gravity, Urine 1.021 1.005 - 1.030   pH 5.0 5.0 - 8.0   Glucose, UA >=500 (A) NEGATIVE mg/dL   Hgb urine dipstick NEGATIVE NEGATIVE   Bilirubin Urine NEGATIVE NEGATIVE   Ketones, ur NEGATIVE NEGATIVE mg/dL   Protein, ur 30 (A) NEGATIVE mg/dL   Nitrite NEGATIVE NEGATIVE   Leukocytes, UA NEGATIVE NEGATIVE   RBC / HPF 0-5 0 - 5 RBC/hpf   WBC, UA 0-5 0 - 5 WBC/hpf   Bacteria, UA RARE (A) NONE SEEN   Squamous Epithelial / LPF 0-5 0 - 5   Mucus PRESENT    Hyaline Casts, UA PRESENT     Comment: Performed at [facility], [address], [place], [place] [address]  CBG monitoring, ED     Status: Abnormal   Collection Time: 01/13/18 11:29 PM  Result Value Ref Range   Glucose-Capillary 198 (H) 65 - 99 mg/dL  Glucose, capillary     Status: Abnormal   Collection Time: 01/14/18  6:16 AM  Result Value Ref  Range   Glucose-Capillary 182 (H) 65 - 99 mg/dL   Comment 1 Notify RN    Comment 2 Document in Chart   Glucose, capillary     Status: Abnormal   Collection Time: 01/14/18 11:57 AM  Result Value Ref Range   Glucose-Capillary 237 (H) 65 - 99 mg/dL   Comment 1 Notify RN    Comment 2 Document in Chart     Blood Alcohol level:  Lab Results  Component Value Date   ETH <10 01/13/2018   ETH <10 09/62/8366    Metabolic Disorder Labs: Lab Results  Component Value Date   HGBA1C 12.3 (H) 10/09/2015   MPG 306 10/09/2015   MPG 249 (H) 08/28/2014   No results found for: PROLACTIN Lab Results  Component Value Date   CHOL 196 08/28/2014   TRIG 129 08/28/2014   HDL 45 08/28/2014   CHOLHDL 4.4 08/28/2014   VLDL 26 08/28/2014   LDLCALC 125 (H)  08/28/2014   LDLCALC 107 (H) 09/04/2013    Physical Findings: AIMS: Facial and Oral Movements Muscles of Facial Expression: None, normal Lips and Perioral Area: None, normal Jaw: None, normal Tongue: None, normal,Extremity Movements Upper (arms, wrists, hands, fingers): None, normal Lower (legs, knees, ankles, toes): None, normal, Trunk Movements Neck, shoulders, hips: None, normal, Overall Severity Severity of abnormal movements (highest score from questions above): None, normal Incapacitation due to abnormal movements: None, normal Patient's awareness of abnormal movements (rate only patient's report): No Awareness, Dental Status Current problems with teeth and/or dentures?: No Does patient usually wear dentures?: No  CIWA:  CIWA-Ar Total: 1 COWS:  COWS Total Score: 1  Musculoskeletal: Strength & Muscle Tone:  reports residual L side hemiparesis from prior history of CVA  Gait & Station: normal Patient leans: N/A  Psychiatric Specialty Exam: Physical Exam  ROS denies headache, no chest pain, no shortness of breath  Blood pressure (!) 164/99, pulse 76, temperature 98.5 F (36.9 C), temperature source Oral, resp. rate 20, height 5'  (1.524 m), weight 99.8 kg (220 lb), SpO2 98 %.Body mass index is 42.97 kg/m.  General Appearance: Fairly Groomed  Eye Contact:  Good  Speech:  Normal Rate  Volume:  Normal  Mood:  reports her mood is better than on admission  Affect:  restricted, but does smile briefly at times   Thought Process:  Linear and Descriptions of Associations: Intact  Orientation:  Other:  alert, attentive  Thought Content:  denies hallucinations, no delusions expressed   Suicidal Thoughts:  No at this time denies suicidal or self injurious ideations, contracts for safety on unit, denies homicidal or violent ideations  Homicidal Thoughts:  No  Memory:  recent and remote fair  Judgement:  Fair  Insight:  Fair  Psychomotor Activity:  Decreased  Concentration:  Concentration: Fair and Attention Span: Fair  Recall:  [employer] of Knowledge:  Fair  Language:  Good  Akathisia:  Negative  Handed:  Right  AIMS (if indicated):     Assets:  Desire for Improvement Resilience  ADL's:  Intact  Cognition:  WNL  Sleep:  Number of Hours: 6.75   Assessment - 47 year old female, history of Schizoaffective Disorder, Cannabis/Cocaine Abuse. Reports partially improved mood, but affect remains blunted/restricted. Denies SI at this time. Denies hallucinations and does not currently present internally preoccupied. Sent to ED yesterday due to concerns about slurred speech , Head neuroimaging negative for acute changes.  Currently on Abilify/Remeron- tolerating well thus far.   Treatment Plan Summary: Daily contact with patient to assess and evaluate symptoms and progress in treatment, Medication management, Plan inpatient treatment ' and medications as below Encourage group and milieu participation to work on coping skills and symptom reduction Encourage efforts to work on sobriety and relapse prevention Treatment team working on disposition planning options Continue Abilify 15 mgrs QHS for mood disorder Continue Remeron 15  mgrs QHS for depression, insomnia  Continue Lisinopril, Norvasc, Hydralazine, Clonidine  for management of HTN Jenne Campus, MD 01/14/2018, 3:02 PM

## 2018-01-14 NOTE — Plan of Care (Signed)
Nurse discussed depression, anxiety, coping skills with patient.  

## 2018-01-14 NOTE — BHH Group Notes (Signed)
[facility] Group Notes:  (Nursing/MHT/Case Management/Adjunct)  Date:  01/14/2018  Time:  4:15 pm  Type of Therapy:  Psychoeducational group  Participation Level:  Did Not Attend  Participation Quality:    Affect:    Cognitive:    Insight:   Engagement in Group:    Modes of Intervention:    Summary of Progress/Problems:  Cammy Copa 01/14/2018, 6:25 PM

## 2018-01-14 NOTE — Progress Notes (Signed)
Patient ID: Diamond Rice, female   DOB: [birth year], 47 y.o.   MRN: [id number]  Pt currently presents with a flat affect and guarded behavior. Pt in bed during initial assessment, reports ongoing sedation in the morning after morning medications. States "I think it's all of the blood pressure medications." Pt reports to writer that their goal is to "go home tomorrow." Pt reports good sleep with current medication regimen.   Pt provided with medications per providers orders. Pt's labs and vitals were monitored throughout the night. Pt given a 1:1 about emotional and mental status. Pt supported and encouraged to express concerns and questions. Pt educated on medications and fall risk precautions.   Pt's safety ensured with 15 minute and environmental checks. Pt currently denies SI/HI and A/V hallucinations. Pt verbally agrees to seek staff if SI/HI or A/VH occurs and to consult with staff before acting on any harmful thoughts. Will continue POC.

## 2018-01-14 NOTE — ED Notes (Signed)
Patient transported to MRI 

## 2018-01-14 NOTE — Plan of Care (Signed)
Nurse discussed depression, anxiety, coping skills wih patien.

## 2018-01-14 NOTE — ED Notes (Signed)
Pt returned from MRI at this time

## 2018-01-14 NOTE — Progress Notes (Addendum)
D:  Patient's self inventory sheet has poor sleep, no sleep medication.  Good appetite, normal energy level, good concentration.  Rated depression 4, hopeless, anxiety 6.  Denied withdrawals.    Denied SI.  Checked dizziness.  Denied physical pain.  Denied pain medicine. Get discharged.   No discharged plan.   A:  Medications administered per MD orders.  Emotional support and encouragement given patient. R:  Safety maintained with 15 minute checks.  Marland Kitchen

## 2018-01-14 NOTE — Progress Notes (Signed)
Pt did not attend group this evening, since she was feeling bad.

## 2018-01-14 NOTE — Progress Notes (Signed)
Inpatient Diabetes Program Recommendations  AACE/ADA: New Consensus Statement on Inpatient Glycemic Control (2015)  Target Ranges:  Prepandial:   less than 140 mg/dL      Peak postprandial:   less than 180 mg/dL (1-2 hours)      Critically ill patients:  140 - 180 mg/dL   Results for TASHI, ANDUJO (MRN [id number]) as of 01/14/2018 14:07  Ref. Range 01/13/2018 05:42 01/13/2018 12:17 01/13/2018 17:02 01/13/2018 23:29  Glucose-Capillary Latest Ref Range: 65 - 99 mg/dL 182 (H) 256 (H) 276 (H) 198 (H)   Results for LADONA, ROSTEN (MRN [id number]) as of 01/14/2018 14:07  Ref. Range 01/14/2018 06:16 01/14/2018 11:57  Glucose-Capillary Latest Ref Range: 65 - 99 mg/dL 182 (H) 237 (H)    Home DM Meds: Levemir 15 units QHS  Current DM Meds: Levemir 15 units QHS      MD- Please consider the following in-hospital insulin adjustments:  1. Increase Levemir slightly to 18 units QHS  2. Start Novolog Moderate Correction Scale/ SSI (0-15 units) TID AC + HS     --Will follow patient during hospitalization--  Wyn Quaker RN, MSN, CDE Diabetes Coordinator Inpatient Glycemic Control Team Team Pager: [phone] (8a-5p)

## 2018-01-14 NOTE — Progress Notes (Signed)
Patient ID: Diamond Rice, female   DOB: 1971-07-11, 47 y.o.   MRN: [id number] Pt wheeled back onto the unit assisted by MHT. Pt states, "They did all the test and nothing new was found. They think my slurred speech maybe due to some new medications that I got." Pt at this time denies anxiety, depression, SI/HI or AVH. Pt in bed resting at this time.

## 2018-01-14 NOTE — BHH Group Notes (Signed)
[facility] LCSW Group Therapy Note  Date/Time: 01/14/18, 1315  Type of Therapy and Topic:  Group Therapy:  Overcoming Obstacles  Participation Level:  Did not attend  Description of Group:    In this group patients will be encouraged to explore what they see as obstacles to their own wellness and recovery. They will be guided to discuss their thoughts, feelings, and behaviors related to these obstacles. The group will process together ways to cope with barriers, with attention given to specific choices patients can make. Each patient will be challenged to identify changes they are motivated to make in order to overcome their obstacles. This group will be process-oriented, with patients participating in exploration of their own experiences as well as giving and receiving support and challenge from other group members.  Therapeutic Goals: 1. Patient will identify personal and current obstacles as they relate to admission. 2. Patient will identify barriers that currently interfere with their wellness or overcoming obstacles.  3. Patient will identify feelings, thought process and behaviors related to these barriers. 4. Patient will identify two changes they are willing to make to overcome these obstacles:    Summary of Patient Progress      Therapeutic Modalities:   Cognitive Behavioral Therapy Solution Focused Therapy Motivational Interviewing Relapse Prevention Therapy  Lurline Idol, LCSW

## 2018-01-15 LAB — GLUCOSE, CAPILLARY: GLUCOSE-CAPILLARY: 174 mg/dL — AB (ref 65–99)

## 2018-01-15 MED ORDER — HYDRALAZINE HCL 50 MG PO TABS: 50.0000 mg | ORAL_TABLET | Freq: Three times a day (TID) | ORAL | 0 refills | Status: DC

## 2018-01-15 MED ORDER — LISINOPRIL 40 MG PO TABS: 40.0000 mg | ORAL_TABLET | Freq: Every day | ORAL | 0 refills | Status: DC

## 2018-01-15 MED ORDER — CLONIDINE HCL 0.3 MG PO TABS: 0.3000 mg | ORAL_TABLET | Freq: Three times a day (TID) | ORAL | 0 refills | Status: DC

## 2018-01-15 MED ORDER — ARIPIPRAZOLE 15 MG PO TABS: 15.0000 mg | ORAL_TABLET | Freq: Every day | ORAL | 0 refills | Status: DC

## 2018-01-15 MED ORDER — ARIPIPRAZOLE ER 300 MG IM SRER: 300.0000 mg | INTRAMUSCULAR | 0 refills | Status: DC

## 2018-01-15 NOTE — Progress Notes (Signed)
D:  Patient denied SI and HI, contracts for safety.  Denied A/V hallucinations.  Patient excited to be discharged today. A:  Medications administered per MD orders.  Emotional support and encouragement given patient. R:  Safety maintained with 15 minute checks.

## 2018-01-15 NOTE — Discharge Summary (Signed)
Physician Discharge Summary Note  Patient:  Diamond Rice is an 47 y.o., female MRN:  [id number] DOB:  [birth year] Patient phone:  [phone] (home)  Patient address:   [place] [address],  Total Time spent with patient: 45 minutes  Date of Admission:  01/11/2018 Date of Discharge: 01/15/2018  Reason for Admission:  Suicide threat  Principal Problem: Psychoactive substance-induced organic mood disorder [facility]) Discharge Diagnoses: Patient Active Problem List   Diagnosis Date Noted  . Psychoactive substance-induced organic mood disorder ([place]) [F79.02, F06.30] 05/28/2013    Priority: Low  . Cocaine abuse with cocaine-induced mood disorder [facility]) [F14.14] 05/28/2013    Priority: Low  . Cannabis dependence with cannabis-induced anxiety disorder [facility]) [F12.280] 05/28/2013    Priority: Low  . Cannabis use disorder, moderate, dependence ([place]) [F12.20] 05/19/2017  . Cocaine use disorder, moderate, dependence ([place]) [F14.20] 05/19/2017  . Schizoaffective disorder, bipolar type ([place]) [F25.0] 10/07/2015  . OSA (obstructive sleep apnea) [G47.33] 06/09/2015  . Vitamin D deficiency [E55.9] 01/06/2015  . Bilateral knee pain [M25.561, M25.562] 01/05/2015  . Numbness and tingling in right hand [R20.0, R20.2] 01/05/2015  . Insomnia [G47.00] 12/07/2014  . Right-sided lacunar infarction ([place]) [I63.81] 09/15/2014  . Left hemiparesis ([place]) [G81.94] 09/15/2014  . Dyspnea [R06.00]   . Back pain [M54.9] 09/11/2013  . Morbid obesity with BMI of 45.0-49.9, adult ([place]) [E66.01, Z68.42] 04/25/2012  . Chest pain [R07.9] 04/17/2012  . Hypertension [I10] 04/17/2012  . Diabetes mellitus type 2 with complications, uncontrolled ([place]) [E11.8, E11.65] 04/17/2012  . Hyperlipidemia [E78.5] 04/17/2012  . Tobacco use [Z72.0] 04/17/2012    Past Psychiatric History: depression  Past Medical History:  Past Medical History:  Diagnosis Date  . Anginal pain ([place])   . Anxiety   . Chronic lower  back pain 04/17/2012   "just got over some; catched when I walked"  . COPD (chronic obstructive pulmonary disease) ([place])   . Depression   . Headache(784.0) 04/17/2012   "~ qod; lately waking up in am w/one"  . Heart attack ([place])   . High cholesterol   . Hypertension   . Migraines 04/17/2012  . Obesity   . Shortness of breath 04/17/2012   "all the time"  . Sleep apnea   . Stroke ([place])   . Type II diabetes mellitus ([place]) 08/28/2002    Past Surgical History:  Procedure Laterality Date  . CARDIAC CATHETERIZATION  ~ 2011   Family History:  Family History  Problem Relation Age of Onset  . Stroke Brother 26  . Stroke Brother 28  . Heart attack Mother 80  . Stroke Father 31   Family Psychiatric  History: none Social History:  Social History   Substance and Sexual Activity  Alcohol Use No  . Alcohol/week: 0.0 oz   Comment: rare     Social History   Substance and Sexual Activity  Drug Use Yes  . Types: Marijuana, "Crack" cocaine    Social History   Socioeconomic History  . Marital status: Single    Spouse name: Not on file  . Number of children: Not on file  . Years of education: Not on file  . Highest education level: Not on file  Occupational History  . Not on file  Social Needs  . Financial resource strain: Not on file  . Food insecurity:    Worry: Not on file    Inability: Not on file  . Transportation needs:    Medical: Not on file    Non-medical:  Not on file  Tobacco Use  . Smoking status: Current Some Day Smoker    Packs/day: 0.00    Years: 10.00    Pack years: 0.00    Types: Cigarettes    Last attempt to quit: 12/07/2014    Years since quitting: 3.1  . Smokeless tobacco: Never Used  . Tobacco comment: smoke a cigarette a day   Substance and Sexual Activity  . Alcohol use: No    Alcohol/week: 0.0 oz    Comment: rare  . Drug use: Yes    Types: Marijuana, "Crack" cocaine  . Sexual activity: Not Currently  Lifestyle  . Physical activity:    Days per  week: Not on file    Minutes per session: Not on file  . Stress: Not on file  Relationships  . Social connections:    Talks on phone: Not on file    Gets together: Not on file    Attends religious service: Not on file    Active member of club or organization: Not on file    Attends meetings of clubs or organizations: Not on file    Relationship status: Not on file  Other Topics Concern  . 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    Hospital Course:  ON admission 01/12/2018:  Patient is seen and examined.  Patient is a 48 year old female with a past psychiatric history significant for schizoaffective disorder; bipolar type, history of psychoactive substance induced organic mood disorder, cocaine abuse with cocaine induced mood disorder, cannabis dependence with cannabis induced anxiety disorder as well as multiple medical diagnoses including status post CVA, hypertension, and poorly controlled type 2 diabetes mellitus.  Patient presented to the [facility] emergency department yesterday with suicidal ideation.  The patient stated that after mother states she became more depressed and suicidal.  She was grieving for her mother.  Her mother passed away in 11/18/06.  She also stated a nephew had died recently.  She had been noncompliant on her psychiatric medications for several months.  Her last psychiatric hospitalization was at the [facility] in January of this year.  She is also been previously on the long-acting Abilify injection.  When she restarted her medication she was placed on the oral form.  She went to the mental health center yesterday seeking the injection, and expressed suicidal ideation to the staff.  At that point she was sent to the emergency department for suicidal ideation.  She was admitted to the hospital for evaluation and stabilization.  Medications:  Started medical medications along with Abilify 15 mg daily for depression  and Remeron 15 mg at bedtime for sleep  01/13/2018:Subjective: Patient is seen and examined.  Patient's 47 year old female with past psychiatric history significant for schizoaffective disorder; bipolar type, history of psychoactive substance induced organic mood disorder, cocaine abuse with cocaine induced mood disorder, cannabis dependence with cannabis induced anxiety disorder as well as multiple medical diagnoses including status post CVA, hypertension and poorly controlled type 2 diabetes mellitus.  She is seen in follow-up.  He stated she felt better today.  She stated she was not having suicidal thoughts.  She stated that she had signed a 72-hour letter to be released.  She stated that she had to get out of the hospital tomorrow to take care of business at home.  She denied any suicidal ideation.  She denied any complaints today.  Medications:  No changes made  01/14/2018:Subjective:  Patient reports she is feeling better today.  Denies medication side effects, denies suicidal ideations . Objective : I have discussed case with treatment team and have met with patient. 47 year old female, single, lives with a friend, on disability. History of Schizoaffective Disorder. History of prior admission to [facility] in 04/2017, at which time was managed with Abilify, Remeron, Zoloft  Presented to ED due to worsening depression, suicidal ideations. Stressors include recent mother's day, which she states is hard for her as it reminds her of her mother and miscarriages she has had, and recent death of a nephew. Of note, patient has a history of CVA and yesterday was transported to ED due to concerns she was dysarthric/slurring words. Head CT scan/ MRI  negative for acute changes.  At present states she is feeling partially better, less depressed . Of note, currently not exhibiting slurred speech. Denies SI at this time. Limited milieu participation at this time. Denies medication side effects.  Medications:  No  changes made  01/15/2018: Patient has met maximum benefit of hospitalization. Denies suicidal/homicidal ideations, hallucinations, and withdrawal symptoms. Discharge instructions provided with explanations along with crisis numbers, follow-up appointment, and Rx. Stable for discharge.  Physical Findings: AIMS: Facial and Oral Movements Muscles of Facial Expression: None, normal Lips and Perioral Area: None, normal Jaw: None, normal Tongue: None, normal,Extremity Movements Upper (arms, wrists, hands, fingers): None, normal Lower (legs, knees, ankles, toes): None, normal, Trunk Movements Neck, shoulders, hips: None, normal, Overall Severity Severity of abnormal movements (highest score from questions above): None, normal Incapacitation due to abnormal movements: None, normal Patient's awareness of abnormal movements (rate only patient's report): No Awareness, Dental Status Current problems with teeth and/or dentures?: No Does patient usually wear dentures?: No  CIWA:  CIWA-Ar Total: 1 COWS:  COWS Total Score: 2  Musculoskeletal: Strength & Muscle Tone: within normal limits Gait & Station: broad based Patient leans: N/A  Psychiatric Specialty Exam: Review of Systems  All other systems reviewed and are negative.   Blood pressure (!) 157/95, pulse 85, temperature 98.6 F (37 C), temperature source Oral, resp. rate 20, height 5' (1.524 m), weight 99.8 kg (220 lb), SpO2 98 %.Body mass index is 42.97 kg/m.  General Appearance: Casual  Eye Contact::  Fair  Speech:  Normal Rate409  Volume:  Normal  Mood:  Euthymic  Affect:  Appropriate  Thought Process:  Coherent  Orientation:  Full (Time, Place, and Person)  Thought Content:  Logical  Suicidal Thoughts:  No  Homicidal Thoughts:  No  Memory:  Immediate;   Fair  Judgement:  Fair  Insight:  Fair  Psychomotor Activity:  Normal  Concentration:  Fair  Recall:  [employer] of Knowledge:Fair  Language: Good  Akathisia:  No   Handed:  Right  AIMS (if indicated):     Assets:  Communication Skills Desire for Improvement Housing  Sleep:  Number of Hours: 6.5  Cognition: WNL  ADL's:  Intact      Have you used any form of tobacco in the last 30 days? (Cigarettes, Smokeless Tobacco, Cigars, and/or Pipes): Yes  Has this patient used any form of tobacco in the last 30 days? (Cigarettes, Smokeless Tobacco, Cigars, and/or Pipes) Yes, Yes, A prescription for an FDA-approved tobacco cessation medication was offered at discharge and the patient refused  Blood Alcohol level:  Lab Results  Component Value Date   [facility] <10 01/13/2018   ETH <10 43/15/4008    Metabolic Disorder Labs:  Lab  Results  Component Value Date   HGBA1C 12.3 (H) 10/09/2015   MPG 306 10/09/2015   MPG 249 (H) 08/28/2014   No results found for: PROLACTIN Lab Results  Component Value Date   CHOL 196 08/28/2014   TRIG 129 08/28/2014   HDL 45 08/28/2014   CHOLHDL 4.4 08/28/2014   VLDL 26 08/28/2014   LDLCALC 125 (H) 08/28/2014   LDLCALC 107 (H) 09/04/2013    See Psychiatric Specialty Exam and Suicide Risk Assessment completed by Attending Physician prior to discharge.  Discharge destination:  Home  Is patient on multiple antipsychotic therapies at discharge:  No   Has Patient had three or more failed trials of antipsychotic monotherapy by history:  No  Recommended Plan for Multiple Antipsychotic Therapies: NA  Discharge Instructions    Diet - low sodium heart healthy   Complete by:  As directed    Discharge instructions   Complete by:  As directed    Follow-up with outpatient appointment   Increase activity slowly   Complete by:  As directed      Allergies as of 01/15/2018      Reactions   Morphine And Related Hives      Medication List    STOP taking these medications   amLODipine 10 MG tablet Commonly known as:  NORVASC   hydrOXYzine 25 MG tablet Commonly known as:  ATARAX/VISTARIL     TAKE these medications      Indication  ARIPiprazole 15 MG tablet Commonly known as:  ABILIFY Take 1 tablet (15 mg total) by mouth at bedtime. What changed:    when to take this  additional instructions  Indication:  Major Depressive Disorder   ARIPiprazole ER 300 MG Srer injection Commonly known as:  ABILIFY MAINTENA Inject 1.5 mLs (300 mg total) into the muscle every 28 (twenty-eight) days. For mood control What changed:  Another medication with the same name was changed. Make sure you understand how and when to take each.  Indication:  Schizophrenia, mood stability   cloNIDine 0.3 MG tablet Commonly known as:  CATAPRES Take 1 tablet (0.3 mg total) by mouth 3 (three) times daily. What changed:    medication strength  how much to take  Indication:  High Blood Pressure Disorder   hydrALAZINE 50 MG tablet Commonly known as:  APRESOLINE Take 1 tablet (50 mg total) by mouth every 8 (eight) hours. What changed:    medication strength  how much to take  additional instructions  Indication:  High Blood Pressure Disorder   Insulin Detemir 100 UNIT/ML Pen Commonly known as:  LEVEMIR Inject 15 Units into the skin daily at 10 pm.  Indication:  Insulin-Dependent Diabetes   lisinopril 40 MG tablet Commonly known as:  PRINIVIL,ZESTRIL Take 1 tablet (40 mg total) by mouth daily. For high blood pressure  Indication:  High Blood Pressure Disorder      Follow-up Information    [facility]. Go on 01/17/2018.   Specialty:  Behavioral Health Why:  Appointment is with Thursday, 01/17/2018 at 8:20am with Tim at [facility]. Please be sure to bring your Photo ID and any discharge paperwork from your hospitalization.  Contact information: [place] [address] [phone]           Follow-up recommendations:  Activity:  as tolerated Diet:  heart healthy diet  Comments:  Encouraged to follow-up at appointment above  Signed: Waylan Boga, NP 01/15/2018, 10:16 AM

## 2018-01-15 NOTE — BHH Suicide Risk Assessment (Signed)
[facility] Discharge Suicide Risk Assessment   Principal Problem: <principal problem not specified> Discharge Diagnoses:  Patient Active Problem List   Diagnosis Date Noted  . Cannabis use disorder, moderate, dependence ([place]) [F12.20] 05/19/2017  . Cocaine use disorder, moderate, dependence ([place]) [F14.20] 05/19/2017  . Schizoaffective disorder, bipolar type ([place]) [F25.0] 10/07/2015  . OSA (obstructive sleep apnea) [G47.33] 06/09/2015  . Vitamin D deficiency [E55.9] 01/06/2015  . Bilateral knee pain [M25.561, M25.562] 01/05/2015  . Numbness and tingling in right hand [R20.0, R20.2] 01/05/2015  . Insomnia [G47.00] 12/07/2014  . Right-sided lacunar infarction ([place]) [I63.81] 09/15/2014  . Left hemiparesis ([place]) [G81.94] 09/15/2014  . Dyspnea [R06.00]   . Back pain [M54.9] 09/11/2013  . Psychoactive substance-induced organic mood disorder ([place]) [R15.40, F06.30] 05/28/2013  . Cocaine abuse with cocaine-induced mood disorder ([place]) [F14.14] 05/28/2013  . Cannabis dependence with cannabis-induced anxiety disorder ([place]) [F12.280] 05/28/2013  . Morbid obesity with BMI of 45.0-49.9, adult ([place]) [E66.01, Z68.42] 04/25/2012  . Chest pain [R07.9] 04/17/2012  . Hypertension [I10] 04/17/2012  . Diabetes mellitus type 2 with complications, uncontrolled ([place]) [E11.8, E11.65] 04/17/2012  . Hyperlipidemia [E78.5] 04/17/2012  . Tobacco use [Z72.0] 04/17/2012    Total Time spent with patient: 30 minutes  Musculoskeletal: Strength & Muscle Tone: within normal limits Gait & Station: broad based Patient leans: N/A  Psychiatric Specialty Exam: Review of Systems  All other systems reviewed and are negative.   Blood pressure (!) 157/95, pulse 85, temperature 98.6 F (37 C), temperature source Oral, resp. rate 20, height 5' (1.524 m), weight 99.8 kg (220 lb), SpO2 98 %.Body mass index is 42.97 kg/m.  General Appearance: Casual  Eye Contact::  Fair  Speech:  Normal Rate409  Volume:  Normal  Mood:  Euthymic   Affect:  Appropriate  Thought Process:  Coherent  Orientation:  Full (Time, Place, and Person)  Thought Content:  Logical  Suicidal Thoughts:  No  Homicidal Thoughts:  No  Memory:  Immediate;   Fair  Judgement:  Fair  Insight:  Fair  Psychomotor Activity:  Normal  Concentration:  Fair  Recall:  [employer] of Knowledge:Fair  Language: Good  Akathisia:  No  Handed:  Right  AIMS (if indicated):     Assets:  Communication Skills Desire for Improvement Housing  Sleep:  Number of Hours: 6.5  Cognition: WNL  ADL's:  Intact   Mental Status Per Nursing Assessment::   On Admission:  Suicidal ideation indicated by patient  Demographic Factors:  Low socioeconomic status  Loss Factors: NA  Historical Factors: Impulsivity  Risk Reduction Factors:   Sense of responsibility to family and Positive social support  Continued Clinical Symptoms:  Previous Psychiatric Diagnoses and Treatments  Cognitive Features That Contribute To Risk:  None    Suicide Risk:  Minimal: No identifiable suicidal ideation.  Patients presenting with no risk factors but with morbid ruminations; may be classified as minimal risk based on the severity of the depressive symptoms    Plan Of Care/Follow-up recommendations:  Activity:  ad lib  Sharma Covert, MD 01/15/2018, 7:42 AM

## 2018-01-15 NOTE — BHH Suicide Risk Assessment (Signed)
SPE completed with patient, as patient refused to consent to family contact. SPI pamphlet provided to pt and pt was encouraged to share information with support network, ask questions, and talk about any concerns relating to SPE. Patient denies access to guns/firearms and verbalized understanding of information provided. Mobile Crisis information also provided to patient.

## 2018-01-15 NOTE — Progress Notes (Signed)
  [facility] Adult Case Management Discharge Plan :  Will you be returning to the same living situation after discharge:  Yes,  patient is returning home with her roommate At discharge, do you have transportation home?: Yes,  patient reports Beverly Sessions TCT will pick her up at discharge Do you have the ability to pay for your medications: Yes,  Medicaid  Release of information consent forms completed and in the chart;  Patient's signature needed at discharge.  Patient to Follow up at: Follow-up Information    [facility]. Go on 01/17/2018.   Specialty:  Behavioral Health Why:  Appointment is with Thursday, 01/17/2018 at 8:20am with Tim at [facility]. Please be sure to bring your Photo ID and any discharge paperwork from your hospitalization.  Contact information: [place] [address] [phone]           Next level of care provider has access to [place] and Suicide Prevention discussed: Yes,  with the patient  Have you used any form of tobacco in the last 30 days? (Cigarettes, Smokeless Tobacco, Cigars, and/or Pipes): Yes  Has patient been referred to the Quitline?: Patient refused referral  Patient has been referred for addiction treatment: N/A  Marylee Floras, [place] 01/15/2018, 11:00 AM

## 2018-01-15 NOTE — Progress Notes (Signed)
Discharge Note:  Patient discharged to [facility] with [facility]'s driver.  Patient denied SI and HI.  Denied A/V hallucinations.  Suicide prevention information given and discussed with patient who stated she understood and had no questions.  Patient stated she appreciated all assistance received from [facility] staff.  Patient stated she received all her belongings, clothing, misc items, toiletries, prescriptions, etc.  All required discharge information given to patient at discharge.

## 2018-01-18 ENCOUNTER — Encounter (HOSPITAL_BASED_OUTPATIENT_CLINIC_OR_DEPARTMENT_OTHER): Payer: Self-pay

## 2018-01-18 DIAGNOSIS — R0683 Snoring: Secondary | ICD-10-CM

## 2018-01-18 DIAGNOSIS — R5383 Other fatigue: Secondary | ICD-10-CM

## 2018-01-22 ENCOUNTER — Ambulatory Visit: Payer: Self-pay | Admitting: Registered"

## 2018-01-24 DIAGNOSIS — E1142 Type 2 diabetes mellitus with diabetic polyneuropathy: Secondary | ICD-10-CM | POA: Insufficient documentation

## 2018-02-13 ENCOUNTER — Ambulatory Visit (HOSPITAL_BASED_OUTPATIENT_CLINIC_OR_DEPARTMENT_OTHER): Payer: Medicaid Other | Attending: Internal Medicine | Admitting: Internal Medicine

## 2018-02-13 VITALS — Ht 60.0 in | Wt 211.0 lb

## 2018-02-13 DIAGNOSIS — R5383 Other fatigue: Secondary | ICD-10-CM

## 2018-02-13 DIAGNOSIS — I493 Ventricular premature depolarization: Secondary | ICD-10-CM | POA: Diagnosis not present

## 2018-02-13 DIAGNOSIS — I10 Essential (primary) hypertension: Secondary | ICD-10-CM | POA: Insufficient documentation

## 2018-02-13 DIAGNOSIS — R0683 Snoring: Secondary | ICD-10-CM | POA: Diagnosis present

## 2018-02-13 DIAGNOSIS — Z79899 Other long term (current) drug therapy: Secondary | ICD-10-CM | POA: Insufficient documentation

## 2018-02-13 DIAGNOSIS — E119 Type 2 diabetes mellitus without complications: Secondary | ICD-10-CM | POA: Insufficient documentation

## 2018-02-13 DIAGNOSIS — F329 Major depressive disorder, single episode, unspecified: Secondary | ICD-10-CM | POA: Diagnosis not present

## 2018-02-13 DIAGNOSIS — G4733 Obstructive sleep apnea (adult) (pediatric): Secondary | ICD-10-CM | POA: Diagnosis present

## 2018-02-15 ENCOUNTER — Other Ambulatory Visit: Payer: Self-pay | Admitting: Internal Medicine

## 2018-02-18 ENCOUNTER — Ambulatory Visit
Admission: RE | Admit: 2018-02-18 | Discharge: 2018-02-18 | Disposition: A | Discharge status: Home / Self Care | Payer: Medicaid Other | Source: Ambulatory Visit | Attending: Internal Medicine | Admitting: Internal Medicine

## 2018-02-18 ENCOUNTER — Other Ambulatory Visit: Payer: Self-pay | Admitting: Internal Medicine

## 2018-02-18 DIAGNOSIS — L97509 Non-pressure chronic ulcer of other part of unspecified foot with unspecified severity: Principal | ICD-10-CM

## 2018-02-18 DIAGNOSIS — E13621 Other specified diabetes mellitus with foot ulcer: Secondary | ICD-10-CM

## 2018-02-28 DIAGNOSIS — R0683 Snoring: Secondary | ICD-10-CM | POA: Diagnosis not present

## 2018-02-28 NOTE — Procedures (Signed)
[name], [name] Date: 02/13/2018 Gender: Female D.O.B: [birth year] Age (years): 70 Referring Provider: Latanya Presser MD Height (inches): 80 Interpreting Physician: Baird Lyons MD, ABSM Weight (lbs): 211 RPSGT: Laren Everts BMI: 41 MRN: [id number] Neck Size: 16.00  CLINICAL INFORMATION Sleep Study Type: NPSG  Indication for sleep study: Depression, Diabetes, Fatigue, Hypertension, Snoring, Witnesses Apnea / Gasping During Sleep Epworth Sleepiness Score: 1  Most recent polysomnogram dated 05/10/2015 revealed an AHI of 15.9/h and RDI of 24.8/h. Most recent titration study dated 05/10/2015 was optimal at 13cm H2O with an AHI of 6.2/h.  SLEEP STUDY TECHNIQUE As per the AASM Manual for the Scoring of Sleep and Associated Events v2.3 (April 2016) with a hypopnea requiring 4% desaturations.  The channels recorded and monitored were frontal, central and occipital EEG, electrooculogram (EOG), submentalis EMG (chin), nasal and oral airflow, thoracic and abdominal wall motion, anterior tibialis EMG, snore microphone, electrocardiogram, and pulse oximetry.  MEDICATIONS Medications self-administered by patient taken the night of the study : HYDROCODONE, [place] The study was initiated at 10:16:04 PM and ended at 4:45:25 AM.  Sleep onset time was 46.8 minutes and the sleep efficiency was 34.5%%. The total sleep time was 134.5 minutes.  Stage REM latency was N/A minutes.  The patient spent 28.3%% of the night in stage N1 sleep, 71.7%% in stage N2 sleep, 0.0%% in stage N3 and 0.00% in REM.  Alpha intrusion was absent.  Supine sleep was 3.72%.  RESPIRATORY PARAMETERS The overall apnea/hypopnea index (AHI) was 18.7 per hour. There were 35 total apneas, including 27 obstructive, 8 central and 0 mixed apneas. There were 7 hypopneas and 5 RERAs.  The AHI during Stage REM sleep was N/A per hour.  AHI while supine was 84.0 per hour.  The mean  oxygen saturation was 95.4%. The minimum SpO2 during sleep was 87.0%.  moderate snoring was noted during this study.  CARDIAC DATA The 2 lead EKG demonstrated sinus rhythm. The mean heart rate was 66.4 beats per minute. Other EKG findings include: PVCs.  LEG MOVEMENT DATA The total PLMS were 0 with a resulting PLMS index of 0.0. Associated arousal with leg movement index was 2.7 .  IMPRESSIONS - Moderate obstructive sleep apnea occurred during this study (AHI = 18.7/h). - No significant central sleep apnea occurred during this study (CAI = 3.6/h). - Mild oxygen desaturation was noted during this study (Min O2 = 87.0%). - The patient snored with moderate snoring volume. - EKG findings include PVCs. - Clinically significant periodic limb movements did not occur during sleep. No significant associated arousals.  DIAGNOSIS - Obstructive Sleep Apnea (327.23 [G47.33 ICD-10])  RECOMMENDATIONS - Suggest CPAP titration sleep study or DME autopap. Other options woukld be based on clinical judgment. - Be careful with alcohol, sedatives and other CNS depressants that may worsen sleep apnea and disrupt normal sleep architecture. - Sleep hygiene should be reviewed to assess factors that may improve sleep quality. - Weight management and regular exercise should be initiated or continued if appropriate.  [Electronically signed] 02/28/2018 04:02 PM  Baird Lyons MD, [place], American Board of Sleep Medicine   NPI: [id number]                          [place], [place] of Sleep Medicine  ELECTRONICALLY SIGNED ON:  02/28/2018, 3:55 PM [place] PH: [phone]   FX: [phone] [place]  OF SLEEP MEDICINE

## 2018-03-07 DIAGNOSIS — B353 Tinea pedis: Secondary | ICD-10-CM | POA: Insufficient documentation

## 2018-03-17 ENCOUNTER — Encounter (HOSPITAL_COMMUNITY): Payer: Self-pay | Admitting: Emergency Medicine

## 2018-03-17 ENCOUNTER — Emergency Department (HOSPITAL_COMMUNITY): Payer: Medicaid Other

## 2018-03-17 ENCOUNTER — Emergency Department (HOSPITAL_COMMUNITY)
Admission: EM | Admit: 2018-03-17 | Discharge: 2018-03-17 | Disposition: A | Discharge status: Home / Self Care | Payer: Medicaid Other | Attending: Emergency Medicine | Admitting: Emergency Medicine

## 2018-03-17 DIAGNOSIS — I10 Essential (primary) hypertension: Secondary | ICD-10-CM | POA: Diagnosis not present

## 2018-03-17 DIAGNOSIS — L97529 Non-pressure chronic ulcer of other part of left foot with unspecified severity: Secondary | ICD-10-CM | POA: Insufficient documentation

## 2018-03-17 DIAGNOSIS — Z79899 Other long term (current) drug therapy: Secondary | ICD-10-CM | POA: Insufficient documentation

## 2018-03-17 DIAGNOSIS — J449 Chronic obstructive pulmonary disease, unspecified: Secondary | ICD-10-CM | POA: Diagnosis not present

## 2018-03-17 DIAGNOSIS — E11621 Type 2 diabetes mellitus with foot ulcer: Secondary | ICD-10-CM | POA: Diagnosis not present

## 2018-03-17 DIAGNOSIS — L089 Local infection of the skin and subcutaneous tissue, unspecified: Secondary | ICD-10-CM

## 2018-03-17 DIAGNOSIS — E11628 Type 2 diabetes mellitus with other skin complications: Secondary | ICD-10-CM

## 2018-03-17 DIAGNOSIS — M79672 Pain in left foot: Secondary | ICD-10-CM | POA: Diagnosis present

## 2018-03-17 DIAGNOSIS — F17210 Nicotine dependence, cigarettes, uncomplicated: Secondary | ICD-10-CM | POA: Insufficient documentation

## 2018-03-17 LAB — CBC WITH DIFFERENTIAL/PLATELET
Abs Immature Granulocytes: 0 10*3/uL (ref 0.0–0.1)
Basophils Absolute: 0.1 10*3/uL (ref 0.0–0.1)
Basophils Relative: 1 %
EOS ABS: 0.3 10*3/uL (ref 0.0–0.7)
EOS PCT: 4 %
HEMATOCRIT: 38.1 % (ref 36.0–46.0)
Hemoglobin: 11.7 g/dL — ABNORMAL LOW (ref 12.0–15.0)
Immature Granulocytes: 0 %
LYMPHS ABS: 3.4 10*3/uL (ref 0.7–4.0)
Lymphocytes Relative: 40 %
MCH: 22.8 pg — ABNORMAL LOW (ref 26.0–34.0)
MCHC: 30.7 g/dL (ref 30.0–36.0)
MCV: 74.1 fL — AB (ref 78.0–100.0)
MONOS PCT: 9 %
Monocytes Absolute: 0.8 10*3/uL (ref 0.1–1.0)
Neutro Abs: 3.9 10*3/uL (ref 1.7–7.7)
Neutrophils Relative %: 46 %
Platelets: 356 10*3/uL (ref 150–400)
RBC: 5.14 MIL/uL — ABNORMAL HIGH (ref 3.87–5.11)
RDW: 16.6 % — AB (ref 11.5–15.5)
WBC: 8.4 10*3/uL (ref 4.0–10.5)

## 2018-03-17 LAB — COMPREHENSIVE METABOLIC PANEL
ALBUMIN: 3.5 g/dL (ref 3.5–5.0)
ALK PHOS: 64 U/L (ref 38–126)
ALT: 10 U/L (ref 0–44)
AST: 12 U/L — ABNORMAL LOW (ref 15–41)
Anion gap: 10 (ref 5–15)
BUN: 5 mg/dL — AB (ref 6–20)
CHLORIDE: 104 mmol/L (ref 98–111)
CO2: 25 mmol/L (ref 22–32)
CREATININE: 1.06 mg/dL — AB (ref 0.44–1.00)
Calcium: 9.5 mg/dL (ref 8.9–10.3)
GFR calc Af Amer: >60 mL/min (ref >60)
GFR calc non Af Amer: >60 mL/min (ref >60)
GLUCOSE: 161 mg/dL — AB (ref 70–99)
Potassium: 3 mmol/L — ABNORMAL LOW (ref 3.5–5.1)
SODIUM: 139 mmol/L (ref 135–145)
Total Bilirubin: 0.6 mg/dL (ref 0.3–1.2)
Total Protein: 7.6 g/dL (ref 6.5–8.1)

## 2018-03-17 LAB — I-STAT CG4 LACTIC ACID, ED: Lactic Acid, Venous: 1.54 mmol/L (ref 0.5–1.9)

## 2018-03-17 LAB — I-STAT BETA HCG BLOOD, ED (MC, WL, AP ONLY): I-stat hCG, quantitative: <5 m[IU]/mL (ref <5)

## 2018-03-17 MED ORDER — OXYCODONE-ACETAMINOPHEN 5-325 MG PO TABS
1.0000 | ORAL_TABLET | Freq: Once | ORAL | Status: AC
  Administered 2018-03-17: 1 via ORAL
  Filled 2018-03-17: qty 1

## 2018-03-17 MED ORDER — OXYCODONE-ACETAMINOPHEN 5-325 MG PO TABS: 1.0000 | ORAL_TABLET | Freq: Four times a day (QID) | ORAL | 0 refills | Status: DC | PRN

## 2018-03-17 MED ORDER — CEPHALEXIN 250 MG PO CAPS
500.0000 mg | ORAL_CAPSULE | Freq: Once | ORAL | Status: AC
  Administered 2018-03-17: 500 mg via ORAL
  Filled 2018-03-17: qty 2

## 2018-03-17 MED ORDER — CEPHALEXIN 500 MG PO CAPS: 500.0000 mg | ORAL_CAPSULE | Freq: Three times a day (TID) | ORAL | 0 refills | Status: DC

## 2018-03-17 NOTE — Discharge Instructions (Addendum)
Your evaluated in the emergency department for nonhealing wound on your left foot.  You had lab work and an Publishing rights manager.  We are prescribing you some antibiotics and pain medicine and I have put in a referral to the wound clinic.  I am also given you their number to call to get set up for an appointment.  You should continue to use soap and water daily to the area and return if any worsening symptoms.

## 2018-03-17 NOTE — ED Triage Notes (Signed)
Brought by ems for c/o left foot pain from diabetic foot ulcer on top of foot.  Patient reports it has been going on for a month.  Seen by PCP.  Told to keep clean.  Purulent drainage noted from wound with foul odor.  Denies having any chills or fever.  CBG-163 in triage.  BP elevated.  Reports that she has not taken bp meds in two days.

## 2018-03-17 NOTE — ED Provider Notes (Signed)
[facility] EMERGENCY DEPARTMENT Provider Note   CSN: [id number] Arrival date & time: 03/17/18  1443     History   Chief Complaint Chief Complaint  Patient presents with  . Foot Ulcer    HPI Diamond Rice is a 47 y.o. female.  Is a prior history of stroke diabetes peripheral vascular disease neuropathy here with left foot pain.  She said she had a trip and fall with a small wound to the top of her left foot about a month ago that has gotten progressively more painful swollen over the last few weeks.  She denies any fever or chills.  She said she saw her doctor who cleaned the wound out and put her on some antibiotics but it did not seem to improve.  Michela Pitcher it has been draining some purulent material.  She is trying ibuprofen and Tylenol with any relief.  She does not check her sugars so she does not know if they have been elevated.  She also has not taken her blood pressure medicines in a few days.  The history is provided by the patient.  Foot Pain  This is a new problem. Episode onset: 1 month. The problem occurs constantly. The problem has been gradually worsening. Pertinent negatives include no chest pain, no abdominal pain, no headaches and no shortness of breath. The symptoms are aggravated by walking. Nothing relieves the symptoms. She has tried acetaminophen for the symptoms. The treatment provided no relief.    Past Medical History:  Diagnosis Date  . Anginal pain ([place])   . Anxiety   . Chronic lower back pain 04/17/2012   "just got over some; catched when I walked"  . COPD (chronic obstructive pulmonary disease) ([place])   . Depression   . Headache(784.0) 04/17/2012   "~ qod; lately waking up in am w/one"  . Heart attack ([place])   . High cholesterol   . Hypertension   . Migraines 04/17/2012  . Obesity   . Shortness of breath 04/17/2012   "all the time"  . Sleep apnea   . Stroke ([place])   . Type II diabetes mellitus ([place]) 08/28/2002    Patient Active  Problem List   Diagnosis Date Noted  . Cannabis use disorder, moderate, dependence ([place]) 05/19/2017  . Cocaine use disorder, moderate, dependence ([place]) 05/19/2017  . Schizoaffective disorder, bipolar type ([place]) 10/07/2015  . OSA (obstructive sleep apnea) 06/09/2015  . Vitamin D deficiency 01/06/2015  . Bilateral knee pain 01/05/2015  . Numbness and tingling in right hand 01/05/2015  . Insomnia 12/07/2014  . Right-sided lacunar infarction ([place]) 09/15/2014  . Left hemiparesis ([place]) 09/15/2014  . Dyspnea   . Back pain 09/11/2013  . Psychoactive substance-induced organic mood disorder ([place]) 05/28/2013  . Cocaine abuse with cocaine-induced mood disorder ([place]) 05/28/2013  . Cannabis dependence with cannabis-induced anxiety disorder ([place]) 05/28/2013  . Morbid obesity with BMI of 45.0-49.9, adult ([place]) 04/25/2012  . Chest pain 04/17/2012  . Hypertension 04/17/2012  . Diabetes mellitus type 2 with complications, uncontrolled ([place]) 04/17/2012  . Hyperlipidemia 04/17/2012  . Tobacco use 04/17/2012    Past Surgical History:  Procedure Laterality Date  . CARDIAC CATHETERIZATION  ~ 2011     OB History   None      Home Medications    Prior to Admission medications   Medication Sig Start Date End Date Taking? Authorizing Provider  ARIPiprazole (ABILIFY) 15 MG tablet Take 1 tablet (15 mg total) by mouth at bedtime. 01/15/18  Patrecia Pour, NP  ARIPiprazole ER (ABILIFY MAINTENA) 300 MG SRER injection Inject 1.5 mLs (300 mg total) into the muscle every 28 (twenty-eight) days. For mood control 01/15/18   Patrecia Pour, NP  cloNIDine (CATAPRES) 0.3 MG tablet Take 1 tablet (0.3 mg total) by mouth 3 (three) times daily. 01/15/18   Patrecia Pour, NP  hydrALAZINE (APRESOLINE) 50 MG tablet Take 1 tablet (50 mg total) by mouth every 8 (eight) hours. 01/15/18   Patrecia Pour, NP  Insulin Detemir (LEVEMIR) 100 UNIT/ML Pen Inject 15 Units into the skin daily at 10 pm.    [provider]    lisinopril (PRINIVIL,ZESTRIL) 40 MG tablet Take 1 tablet (40 mg total) by mouth daily. For high blood pressure 01/15/18   Patrecia Pour, NP    Family History Family History  Problem Relation Age of Onset  . Stroke Brother 5  . Stroke Brother 65  . Heart attack Mother 49  . Stroke Father 74    Social History Social History   Tobacco Use  . Smoking status: Current Some Day Smoker    Packs/day: 0.00    Years: 10.00    Pack years: 0.00    Types: Cigarettes    Last attempt to quit: 12/07/2014    Years since quitting: 3.2  . Smokeless tobacco: Never Used  . Tobacco comment: smoke a cigarette a day   Substance Use Topics  . Alcohol use: No    Alcohol/week: 0.0 oz    Comment: rare  . Drug use: Yes    Types: Marijuana, "Crack" cocaine     Allergies   Morphine and related   Review of Systems Review of Systems  Constitutional: Negative for fever.  HENT: Negative for sore throat.   Eyes: Negative for visual disturbance.  Respiratory: Negative for shortness of breath.   Cardiovascular: Negative for chest pain.  Gastrointestinal: Negative for abdominal pain.  Genitourinary: Negative for dysuria.  Musculoskeletal: Positive for gait problem.  Skin: Positive for wound. Negative for rash.  Neurological: Negative for headaches.     Physical Exam Updated Vital Signs BP (!) 198/107 (BP Location: Right Arm)   Pulse 79   Temp 98 F (36.7 C) (Oral)   Resp 16   Ht 5\' 1"  (1.549 m)   Wt 95.7 kg (211 lb)   SpO2 100%   BMI 39.87 kg/m   Physical Exam  Constitutional: She appears well-developed and well-nourished.  HENT:  Head: Normocephalic and atraumatic.  Eyes: Conjunctivae are normal.  Neck: Neck supple.  Cardiovascular: Normal rate, regular rhythm and normal heart sounds.  Pulmonary/Chest: Effort normal and breath sounds normal. She has no wheezes. She has no rales.  Abdominal: Soft. She exhibits no mass. There is no tenderness. There is no guarding.   Musculoskeletal: Normal range of motion.  Left foot she is got approximately 3 cm shallow ulceration fourth metatarsal head on the dorsal side with some edema of her third and fourth digits.  There is no crepitus in the tissues not warm.  There is some edema of the toes and midfoot.  No obvious erythema.  There is no drainage from the wound.   Neurological: She is alert. GCS eye subscore is 4. GCS verbal subscore is 5. GCS motor subscore is 6.  Skin: Skin is warm and dry.  Psychiatric: She has a normal mood and affect.       ED Treatments / Results  Labs (all labs ordered are listed, but only abnormal  results are displayed) Labs Reviewed  COMPREHENSIVE METABOLIC PANEL - Abnormal; Notable for the following components:      Result Value   Potassium 3.0 (*)    Glucose, Bld 161 (*)    BUN 5 (*)    Creatinine, Ser 1.06 (*)    AST 12 (*)    All other components within normal limits  CBC WITH DIFFERENTIAL/PLATELET - Abnormal; Notable for the following components:   RBC 5.14 (*)    Hemoglobin 11.7 (*)    MCV 74.1 (*)    MCH 22.8 (*)    RDW 16.6 (*)    All other components within normal limits  URINALYSIS, ROUTINE W REFLEX MICROSCOPIC  I-STAT CG4 LACTIC ACID, ED  I-STAT BETA HCG BLOOD, ED ([facility], [facility], [facility] ONLY)    EKG None  Radiology Dg Foot Complete Left  Result Date: 03/17/2018 CLINICAL DATA:  Progressive pain and swelling associated with a soft tissue ulcer in the dorsum of the left foot at the 4th metatarsal head due to a fall 1 month ago. EXAM: LEFT FOOT - COMPLETE 3+ VIEW COMPARISON:  02/18/2018. FINDINGS: Interval visualization of the soft tissue ulceration in the dorsum of the distal foot on the lateral view. No associated soft tissue gas or underlying bone destruction or periosteal reaction. No fracture or dislocation. Large inferior calcaneal spur. IMPRESSION: Dorsal soft tissue ulceration in the distal foot without evidence of underlying osteomyelitis. Electronically Signed    By: Claudie Revering M.D.   On: 03/17/2018 07:57    Procedures Procedures (including critical care time)  Medications Ordered in ED Medications  cephALEXin (KEFLEX) capsule 500 mg (500 mg Oral Given 03/17/18 0735)  oxyCODONE-acetaminophen (PERCOCET/ROXICET) 5-325 MG per tablet 1 tablet (1 tablet Oral Given 03/17/18 0735)     Initial Impression / Assessment and Plan / ED Course  I have reviewed the triage vital signs and the nursing notes.  Pertinent labs & imaging results that were available during my care of the patient were reviewed by me and considered in my medical decision making (see chart for details).  Clinical Course as of Mar 17 1805  Sun Mar 17, 2649  947 47 year old diabetic with nonhealing ulcer on the dorsum of her left foot.  She is afebrile here appears nontoxic normal white count normal lactate.  Her sugars are also not grossly out of whack.  She is getting an x-ray and some pain medicine but likely would be discharged on antibiotics and close follow-up with PCP and potentially wound clinic.   [MB]  [id number] Patient's x-ray shows no signs of osteo-.  I do not see that she is been on antibiotics and have reviewed her meds with pharmacy.  We will get her started on Keflex and put in a referral to the wound clinic to get her followed up there.  She is asking for pain medicine I will provide her with a small prescription for some oxycodone but have asked her to talk to her primary care doctor about any further pain medicine.   [MB]    Clinical Course User Index [MB] Hayden Rasmussen, MD    Final Clinical Impressions(s) / ED Diagnoses   Final diagnoses:  Diabetic foot infection [facility])    ED Discharge Orders        Ordered    cephALEXin (KEFLEX) 500 MG capsule  3 times daily     03/17/18 0826    oxyCODONE-acetaminophen (PERCOCET/ROXICET) 5-325 MG tablet  Every 6 hours PRN  03/17/18 0826    Ambulatory referral to Wound Clinic     03/17/18 0826       Hayden Rasmussen, MD 03/17/18 1806

## 2018-03-18 LAB — CBG MONITORING, ED: GLUCOSE-CAPILLARY: 163 mg/dL — AB (ref 70–99)

## 2018-04-02 ENCOUNTER — Inpatient Hospital Stay (HOSPITAL_COMMUNITY)
Admission: EM | Admit: 2018-04-02 | Discharge: 2018-04-12 | DRG: 377 | Disposition: A | Discharge status: Skilled Nursing Facility | Payer: Medicare Other | Attending: Internal Medicine | Admitting: Internal Medicine

## 2018-04-02 ENCOUNTER — Emergency Department (HOSPITAL_COMMUNITY): Payer: Medicare Other

## 2018-04-02 ENCOUNTER — Encounter (HOSPITAL_COMMUNITY): Payer: Self-pay | Admitting: Emergency Medicine

## 2018-04-02 ENCOUNTER — Observation Stay (HOSPITAL_COMMUNITY): Payer: Medicare Other

## 2018-04-02 ENCOUNTER — Other Ambulatory Visit: Payer: Self-pay

## 2018-04-02 ENCOUNTER — Encounter (HOSPITAL_COMMUNITY): Payer: Self-pay

## 2018-04-02 DIAGNOSIS — R0602 Shortness of breath: Secondary | ICD-10-CM

## 2018-04-02 DIAGNOSIS — I252 Old myocardial infarction: Secondary | ICD-10-CM

## 2018-04-02 DIAGNOSIS — E1140 Type 2 diabetes mellitus with diabetic neuropathy, unspecified: Secondary | ICD-10-CM | POA: Diagnosis present

## 2018-04-02 DIAGNOSIS — L97509 Non-pressure chronic ulcer of other part of unspecified foot with unspecified severity: Secondary | ICD-10-CM

## 2018-04-02 DIAGNOSIS — Z720 Tobacco use: Secondary | ICD-10-CM | POA: Diagnosis present

## 2018-04-02 DIAGNOSIS — J449 Chronic obstructive pulmonary disease, unspecified: Secondary | ICD-10-CM | POA: Diagnosis present

## 2018-04-02 DIAGNOSIS — N179 Acute kidney failure, unspecified: Secondary | ICD-10-CM | POA: Diagnosis present

## 2018-04-02 DIAGNOSIS — K6289 Other specified diseases of anus and rectum: Secondary | ICD-10-CM | POA: Diagnosis present

## 2018-04-02 DIAGNOSIS — R079 Chest pain, unspecified: Secondary | ICD-10-CM | POA: Diagnosis not present

## 2018-04-02 DIAGNOSIS — I110 Hypertensive heart disease with heart failure: Secondary | ICD-10-CM | POA: Diagnosis present

## 2018-04-02 DIAGNOSIS — Z8673 Personal history of transient ischemic attack (TIA), and cerebral infarction without residual deficits: Secondary | ICD-10-CM

## 2018-04-02 DIAGNOSIS — K625 Hemorrhage of anus and rectum: Secondary | ICD-10-CM | POA: Diagnosis not present

## 2018-04-02 DIAGNOSIS — I5033 Acute on chronic diastolic (congestive) heart failure: Secondary | ICD-10-CM | POA: Diagnosis not present

## 2018-04-02 DIAGNOSIS — D62 Acute posthemorrhagic anemia: Secondary | ICD-10-CM | POA: Diagnosis present

## 2018-04-02 DIAGNOSIS — Z8249 Family history of ischemic heart disease and other diseases of the circulatory system: Secondary | ICD-10-CM

## 2018-04-02 DIAGNOSIS — I509 Heart failure, unspecified: Secondary | ICD-10-CM

## 2018-04-02 DIAGNOSIS — E876 Hypokalemia: Secondary | ICD-10-CM | POA: Diagnosis present

## 2018-04-02 DIAGNOSIS — R42 Dizziness and giddiness: Secondary | ICD-10-CM

## 2018-04-02 DIAGNOSIS — K922 Gastrointestinal hemorrhage, unspecified: Secondary | ICD-10-CM | POA: Diagnosis not present

## 2018-04-02 DIAGNOSIS — L97529 Non-pressure chronic ulcer of other part of left foot with unspecified severity: Secondary | ICD-10-CM | POA: Diagnosis present

## 2018-04-02 DIAGNOSIS — Z823 Family history of stroke: Secondary | ICD-10-CM

## 2018-04-02 DIAGNOSIS — L03116 Cellulitis of left lower limb: Secondary | ICD-10-CM | POA: Diagnosis present

## 2018-04-02 DIAGNOSIS — E11621 Type 2 diabetes mellitus with foot ulcer: Secondary | ICD-10-CM | POA: Diagnosis present

## 2018-04-02 DIAGNOSIS — E1151 Type 2 diabetes mellitus with diabetic peripheral angiopathy without gangrene: Secondary | ICD-10-CM | POA: Diagnosis present

## 2018-04-02 DIAGNOSIS — F1910 Other psychoactive substance abuse, uncomplicated: Secondary | ICD-10-CM | POA: Diagnosis present

## 2018-04-02 DIAGNOSIS — D124 Benign neoplasm of descending colon: Secondary | ICD-10-CM | POA: Diagnosis present

## 2018-04-02 DIAGNOSIS — Z794 Long term (current) use of insulin: Secondary | ICD-10-CM

## 2018-04-02 DIAGNOSIS — I10 Essential (primary) hypertension: Secondary | ICD-10-CM

## 2018-04-02 DIAGNOSIS — D122 Benign neoplasm of ascending colon: Secondary | ICD-10-CM | POA: Diagnosis present

## 2018-04-02 DIAGNOSIS — F250 Schizoaffective disorder, bipolar type: Secondary | ICD-10-CM | POA: Diagnosis present

## 2018-04-02 DIAGNOSIS — E118 Type 2 diabetes mellitus with unspecified complications: Secondary | ICD-10-CM | POA: Diagnosis not present

## 2018-04-02 DIAGNOSIS — Z6841 Body Mass Index (BMI) 40.0 and over, adult: Secondary | ICD-10-CM

## 2018-04-02 DIAGNOSIS — D649 Anemia, unspecified: Secondary | ICD-10-CM | POA: Diagnosis not present

## 2018-04-02 DIAGNOSIS — K626 Ulcer of anus and rectum: Secondary | ICD-10-CM | POA: Diagnosis present

## 2018-04-02 DIAGNOSIS — E785 Hyperlipidemia, unspecified: Secondary | ICD-10-CM | POA: Diagnosis present

## 2018-04-02 DIAGNOSIS — F17210 Nicotine dependence, cigarettes, uncomplicated: Secondary | ICD-10-CM | POA: Diagnosis present

## 2018-04-02 DIAGNOSIS — G4733 Obstructive sleep apnea (adult) (pediatric): Secondary | ICD-10-CM | POA: Diagnosis present

## 2018-04-02 HISTORY — DX: Acute kidney failure, unspecified: N17.9

## 2018-04-02 LAB — CBC
HEMATOCRIT: 37.8 % (ref 36.0–46.0)
HEMATOCRIT: 30.8 % — AB (ref 36.0–46.0)
Hemoglobin: 11.7 g/dL — ABNORMAL LOW (ref 12.0–15.0)
Hemoglobin: 9.6 g/dL — ABNORMAL LOW (ref 12.0–15.0)
MCH: 23.3 pg — AB (ref 26.0–34.0)
MCH: 22.9 pg — AB (ref 26.0–34.0)
MCHC: 31 g/dL (ref 30.0–36.0)
MCHC: 31.2 g/dL (ref 30.0–36.0)
MCV: 75.1 fL — AB (ref 78.0–100.0)
MCV: 73.3 fL — AB (ref 78.0–100.0)
Platelets: 353 10*3/uL (ref 150–400)
Platelets: 310 10*3/uL (ref 150–400)
RBC: 5.03 MIL/uL (ref 3.87–5.11)
RBC: 4.2 MIL/uL (ref 3.87–5.11)
RDW: 15.3 % (ref 11.5–15.5)
RDW: 15.3 % (ref 11.5–15.5)
WBC: 11.1 10*3/uL — AB (ref 4.0–10.5)
WBC: 11.5 10*3/uL — AB (ref 4.0–10.5)

## 2018-04-02 LAB — BASIC METABOLIC PANEL
Anion gap: 11 (ref 5–15)
BUN: 17 mg/dL (ref 6–20)
CHLORIDE: 99 mmol/L (ref 98–111)
CO2: 26 mmol/L (ref 22–32)
CREATININE: 1.63 mg/dL — AB (ref 0.44–1.00)
Calcium: 9.1 mg/dL (ref 8.9–10.3)
GFR calc Af Amer: 43 mL/min — ABNORMAL LOW (ref >60)
GFR calc non Af Amer: 37 mL/min — ABNORMAL LOW (ref >60)
GLUCOSE: 207 mg/dL — AB (ref 70–99)
POTASSIUM: 2.9 mmol/L — AB (ref 3.5–5.1)
SODIUM: 136 mmol/L (ref 135–145)

## 2018-04-02 LAB — PROTIME-INR
INR: 0.96
PROTHROMBIN TIME: 12.7 s (ref 11.4–15.2)

## 2018-04-02 LAB — CBG MONITORING, ED
Glucose-Capillary: 120 mg/dL — ABNORMAL HIGH (ref 70–99)
Glucose-Capillary: 166 mg/dL — ABNORMAL HIGH (ref 70–99)

## 2018-04-02 LAB — HEMOGLOBIN AND HEMATOCRIT, BLOOD
HCT: 34.3 % — ABNORMAL LOW (ref 36.0–46.0)
Hemoglobin: 10.4 g/dL — ABNORMAL LOW (ref 12.0–15.0)

## 2018-04-02 LAB — I-STAT TROPONIN, ED
Troponin i, poc: 0.05 ng/mL (ref 0.00–0.08)
Troponin i, poc: 0 ng/mL (ref 0.00–0.08)

## 2018-04-02 LAB — C-REACTIVE PROTEIN: CRP: 1.1 mg/dL — ABNORMAL HIGH (ref <1.0)

## 2018-04-02 LAB — GLUCOSE, CAPILLARY: Glucose-Capillary: 143 mg/dL — ABNORMAL HIGH (ref 70–99)

## 2018-04-02 LAB — TSH: TSH: 0.705 u[IU]/mL (ref 0.350–4.500)

## 2018-04-02 LAB — HEMOGLOBIN: HEMOGLOBIN: 9.2 g/dL — AB (ref 12.0–15.0)

## 2018-04-02 LAB — SAMPLE TO BLOOD BANK

## 2018-04-02 LAB — I-STAT BETA HCG BLOOD, ED (MC, WL, AP ONLY)

## 2018-04-02 LAB — POC OCCULT BLOOD, ED: Fecal Occult Bld: POSITIVE — AB

## 2018-04-02 LAB — SEDIMENTATION RATE: Sed Rate: 55 mm/hr — ABNORMAL HIGH (ref 0–22)

## 2018-04-02 LAB — TROPONIN I

## 2018-04-02 LAB — PREALBUMIN: Prealbumin: 14.7 mg/dL — ABNORMAL LOW (ref 18–38)

## 2018-04-02 MED ORDER — INSULIN DETEMIR 100 UNIT/ML ~~LOC~~ SOLN
15.0000 [IU] | Freq: Two times a day (BID) | SUBCUTANEOUS | Status: DC
  Administered 2018-04-02 – 2018-04-12 (×19): 15 [IU] via SUBCUTANEOUS
  Filled 2018-04-02 (×23): qty 0.15

## 2018-04-02 MED ORDER — ARIPIPRAZOLE 5 MG PO TABS
15.0000 mg | ORAL_TABLET | Freq: Every day | ORAL | Status: DC
  Administered 2018-04-03 – 2018-04-11 (×10): 15 mg via ORAL
  Filled 2018-04-02 (×12): qty 1

## 2018-04-02 MED ORDER — INSULIN ASPART 100 UNIT/ML ~~LOC~~ SOLN
0.0000 [IU] | Freq: Three times a day (TID) | SUBCUTANEOUS | Status: DC
  Administered 2018-04-02 – 2018-04-04 (×4): 3 [IU] via SUBCUTANEOUS
  Administered 2018-04-05: 2 [IU] via SUBCUTANEOUS
  Administered 2018-04-05: 3 [IU] via SUBCUTANEOUS
  Administered 2018-04-05: 2 [IU] via SUBCUTANEOUS
  Administered 2018-04-06: 3 [IU] via SUBCUTANEOUS
  Administered 2018-04-06: 1 [IU] via SUBCUTANEOUS
  Administered 2018-04-07: 5 [IU] via SUBCUTANEOUS
  Administered 2018-04-07: 2 [IU] via SUBCUTANEOUS
  Administered 2018-04-07: 3 [IU] via SUBCUTANEOUS
  Administered 2018-04-08 (×2): 2 [IU] via SUBCUTANEOUS
  Administered 2018-04-09: 5 [IU] via SUBCUTANEOUS
  Administered 2018-04-09: 2 [IU] via SUBCUTANEOUS
  Administered 2018-04-09 – 2018-04-10 (×2): 3 [IU] via SUBCUTANEOUS
  Administered 2018-04-10: 2 [IU] via SUBCUTANEOUS
  Administered 2018-04-10: 5 [IU] via SUBCUTANEOUS
  Administered 2018-04-11 – 2018-04-12 (×4): 3 [IU] via SUBCUTANEOUS
  Filled 2018-04-02: qty 1

## 2018-04-02 MED ORDER — ONDANSETRON HCL 4 MG/2ML IJ SOLN: 4.0000 mg | Freq: Four times a day (QID) | INTRAMUSCULAR | Status: DC | PRN

## 2018-04-02 MED ORDER — HYDRALAZINE HCL 20 MG/ML IJ SOLN
5.0000 mg | INTRAMUSCULAR | Status: DC | PRN
  Administered 2018-04-08: 5 mg via INTRAVENOUS
  Filled 2018-04-02: qty 1

## 2018-04-02 MED ORDER — ONDANSETRON HCL 4 MG PO TABS: 4.0000 mg | ORAL_TABLET | Freq: Four times a day (QID) | ORAL | Status: DC | PRN

## 2018-04-02 MED ORDER — GADOBENATE DIMEGLUMINE 529 MG/ML IV SOLN
10.0000 mL | Freq: Once | INTRAVENOUS | Status: AC
  Administered 2018-04-02: 10 mL via INTRAVENOUS

## 2018-04-02 MED ORDER — SODIUM CHLORIDE 0.9 % IV BOLUS
1000.0000 mL | Freq: Once | INTRAVENOUS | Status: AC
  Administered 2018-04-02: 1000 mL via INTRAVENOUS

## 2018-04-02 MED ORDER — IOHEXOL 300 MG/ML  SOLN
100.0000 mL | Freq: Once | INTRAMUSCULAR | Status: AC | PRN
  Administered 2018-04-02: 100 mL via INTRAVENOUS

## 2018-04-02 MED ORDER — ACETAMINOPHEN 650 MG RE SUPP: 650.0000 mg | Freq: Four times a day (QID) | RECTAL | Status: DC | PRN

## 2018-04-02 MED ORDER — SODIUM CHLORIDE 0.9% FLUSH
3.0000 mL | Freq: Two times a day (BID) | INTRAVENOUS | Status: DC
  Administered 2018-04-02 – 2018-04-12 (×19): 3 mL via INTRAVENOUS

## 2018-04-02 MED ORDER — ACETAMINOPHEN 325 MG PO TABS
650.0000 mg | ORAL_TABLET | Freq: Four times a day (QID) | ORAL | Status: DC | PRN
  Administered 2018-04-03 – 2018-04-11 (×4): 650 mg via ORAL
  Filled 2018-04-02 (×4): qty 2

## 2018-04-02 MED ORDER — SODIUM CHLORIDE 0.9 % IV SOLN
2.0000 g | INTRAVENOUS | Status: DC
  Administered 2018-04-02 – 2018-04-10 (×9): 2 g via INTRAVENOUS
  Filled 2018-04-02 (×9): qty 20

## 2018-04-02 MED ORDER — OXYCODONE-ACETAMINOPHEN 5-325 MG PO TABS
1.0000 | ORAL_TABLET | Freq: Four times a day (QID) | ORAL | Status: DC | PRN
  Administered 2018-04-02 – 2018-04-12 (×23): 1 via ORAL
  Filled 2018-04-02 (×24): qty 1

## 2018-04-02 MED ORDER — HYDRALAZINE HCL 50 MG PO TABS
50.0000 mg | ORAL_TABLET | Freq: Three times a day (TID) | ORAL | Status: DC
  Administered 2018-04-02 – 2018-04-12 (×31): 50 mg via ORAL
  Filled 2018-04-02 (×32): qty 1

## 2018-04-02 MED ORDER — INSULIN ASPART 100 UNIT/ML ~~LOC~~ SOLN
0.0000 [IU] | Freq: Every day | SUBCUTANEOUS | Status: DC
  Administered 2018-04-04 – 2018-04-06 (×3): 2 [IU] via SUBCUTANEOUS
  Administered 2018-04-07: 3 [IU] via SUBCUTANEOUS
  Administered 2018-04-09: 4 [IU] via SUBCUTANEOUS
  Administered 2018-04-10 – 2018-04-11 (×2): 2 [IU] via SUBCUTANEOUS

## 2018-04-02 MED ORDER — CLONIDINE HCL 0.2 MG PO TABS
0.3000 mg | ORAL_TABLET | Freq: Three times a day (TID) | ORAL | Status: DC
  Administered 2018-04-02 – 2018-04-12 (×32): 0.3 mg via ORAL
  Filled 2018-04-02 (×32): qty 1

## 2018-04-02 MED ORDER — COLLAGENASE 250 UNIT/GM EX OINT
TOPICAL_OINTMENT | Freq: Every day | CUTANEOUS | Status: DC
  Administered 2018-04-02 – 2018-04-03 (×2): via TOPICAL
  Administered 2018-04-04 – 2018-04-05 (×2): 1 via TOPICAL
  Administered 2018-04-06 – 2018-04-10 (×5): via TOPICAL
  Administered 2018-04-11: 1 via TOPICAL
  Administered 2018-04-12: 12:00:00 via TOPICAL
  Filled 2018-04-02: qty 30

## 2018-04-02 MED ORDER — POTASSIUM CHLORIDE CRYS ER 20 MEQ PO TBCR
40.0000 meq | EXTENDED_RELEASE_TABLET | Freq: Two times a day (BID) | ORAL | Status: AC
  Administered 2018-04-02 (×2): 40 meq via ORAL
  Filled 2018-04-02 (×2): qty 2

## 2018-04-02 MED ORDER — BOOST / RESOURCE BREEZE PO LIQD CUSTOM
1.0000 | Freq: Three times a day (TID) | ORAL | Status: DC
  Administered 2018-04-02 – 2018-04-03 (×4): 1 via ORAL

## 2018-04-02 MED ORDER — SODIUM CHLORIDE 0.9 % IV SOLN
INTRAVENOUS | Status: DC
  Administered 2018-04-02 – 2018-04-05 (×6): via INTRAVENOUS

## 2018-04-02 MED ORDER — METRONIDAZOLE 500 MG PO TABS
500.0000 mg | ORAL_TABLET | Freq: Three times a day (TID) | ORAL | Status: DC
  Administered 2018-04-02 – 2018-04-10 (×25): 500 mg via ORAL
  Filled 2018-04-02 (×25): qty 1

## 2018-04-02 NOTE — Care Management (Addendum)
ED CM met with patient at bedside , patient reports living at home with a friend, and Dr. Bakare PCP last visit was last month. Patient has Medicare and Medicaid Supplement. She is not active with HH services, she states, referral was placed by PCP,  but no one ever came out.  Discussed HH services and offered choice,  patient selected [facility]. Unit CM will continue to follow up with care transitional planning 

## 2018-04-02 NOTE — H&P (Signed)
History and Physical    Diamond Rice QVZ:[id number] DOB: 1971-04-30 DOA: 04/02/2018  PCP:  [facility]:  Wound consult on 8/13 for non-healing wound on her foot Patient coming from:  Home - lives with friend and her 2 kids; NOK: Brother, [phone]  Chief Complaint: rectal bleeding  HPI: Diamond Rice is a 47 y.o. female with medical history significant of DM; CVA; OSA; HTN; HLD; and COPD presenting with rectal bleeding.  She had been constipated all week, took MOM yesterday.  Today she started "pooping out blood and there was a lot of it".  Maybe 3 episodes and then she got very sweaty and clammy and felt like she was going to pass out.  Then her chest started hurting but that stopped on its own.  She is now scared to poop and she feels gas but doesn't want to let it out.  No h/o similar.  She has never seen GI.  She denies h/o hemorrhoids.  She felt pressure in her bottom like gas but denies abdominal pain.  Other than foot pain, she has been feeling fine.  She has had an ulcer on her left foot for a couple of months.  Her PCP has told her to keep it clean and put a bandaid on it.  She came to the ER 7/21 and was given Keflex and referred to the wound care center.  It is draining.  It feels like a burning feeling.  She denies fevers.   ED Course:  BRBPR (no stool).  Felt constipated, took MOM.  No h/o colonoscopy.  No abdominal pain.  After this, chest pain - EKG nonspecific, troponin x 2 negative.  No further BMs.  Orthostatics positive, dizzy.  Suspect diverticular bleed.  Suggest repeat Hgb, obs, repeat troponin, consult GI.  Review of Systems: As per HPI; otherwise review of systems reviewed and negative.   Ambulatory Status:  Ambulates with a cane or a rolling walker  Past Medical History:  Diagnosis Date  . Anginal pain ([place])   . Anxiety   . Chronic lower back pain 04/17/2012   "just got over some; catched when I walked"  . COPD (chronic obstructive pulmonary  disease) ([place])   . Depression   . Headache(784.0) 04/17/2012   "~ qod; lately waking up in am w/one"  . Heart attack ([place])   . High cholesterol   . Hypertension   . Migraines 04/17/2012  . Obesity   . Shortness of breath 04/17/2012   "all the time"  . Sleep apnea    scheduled to get her CPAP machine this week  . Stroke ([place])   . Type II diabetes mellitus ([place]) 08/28/2002    Past Surgical History:  Procedure Laterality Date  . CARDIAC CATHETERIZATION  ~ 2011    Social History   Socioeconomic History  . Marital status: Single    Spouse name: Not on file  . Number of children: Not on file  . Years of education: Not on file  . Highest education level: Not on file  Occupational History  . Occupation: disabled  Social Needs  . Financial resource strain: Not on file  . Food insecurity:    Worry: Not on file    Inability: Not on file  . Transportation needs:    Medical: Not on file    Non-medical: Not on file  Tobacco Use  . Smoking status: Current Every Day Smoker    Packs/day: 0.25    Years: 28.00  Pack years: 7.00    Types: Cigarettes  . Smokeless tobacco: Never Used  Substance and Sexual Activity  . Alcohol use: No    Alcohol/week: 0.0 oz    Comment: rare  . Drug use: Yes    Types: Marijuana, "Crack" cocaine    Comment: marijuana last use days ago; last cocaine several months ago  . Sexual activity: Not Currently  Lifestyle  . Physical activity:    Days per week: Not on file    Minutes per session: Not on file  . Stress: Not on file  Relationships  . Social connections:    Talks on phone: Not on file    Gets together: Not on file    Attends religious service: Not on file    Active member of club or organization: Not on file    Attends meetings of clubs or organizations: Not on file    Relationship status: Not on file  . Intimate partner violence:    Fear of current or ex partner: Not on file    Emotionally abused: Not on file    Physically abused: Not  on file    Forced sexual activity: Not on file  Other Topics Concern  . Not on file  Social History Narrative   CNA at [employer].  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    Allergies  Allergen Reactions  . Morphine And Related Hives    Family History  Problem Relation Age of Onset  . Stroke Brother 47  . Stroke Brother 41  . Heart attack Mother 33  . Stroke Father 43    Prior to Admission medications   Medication Sig Start Date End Date Taking? Authorizing Provider  ARIPiprazole (ABILIFY) 15 MG tablet Take 1 tablet (15 mg total) by mouth at bedtime. 01/15/18  Yes Lord, Asa Saunas, NP  cephALEXin (KEFLEX) 500 MG capsule Take 1 capsule (500 mg total) by mouth 3 (three) times daily. 03/17/18  Yes Hayden Rasmussen, MD  cloNIDine (CATAPRES) 0.3 MG tablet Take 1 tablet (0.3 mg total) by mouth 3 (three) times daily. 01/15/18  Yes Patrecia Pour, NP  hydrALAZINE (APRESOLINE) 50 MG tablet Take 1 tablet (50 mg total) by mouth every 8 (eight) hours. 01/15/18  Yes Patrecia Pour, NP  Insulin Detemir (LEVEMIR) 100 UNIT/ML Pen Inject 15 Units into the skin 2 (two) times daily.    Yes [provider]  lisinopril (PRINIVIL,ZESTRIL) 40 MG tablet Take 1 tablet (40 mg total) by mouth daily. For high blood pressure 01/15/18  Yes Lord, Asa Saunas, NP  oxyCODONE-acetaminophen (PERCOCET/ROXICET) 5-325 MG tablet Take 1 tablet by mouth every 6 (six) hours as needed for severe pain. 03/17/18  Yes Hayden Rasmussen, MD  ARIPiprazole ER (ABILIFY MAINTENA) 300 MG SRER injection Inject 1.5 mLs (300 mg total) into the muscle every 28 (twenty-eight) days. For mood control Patient not taking: Reported on 03/17/2018 01/15/18   Patrecia Pour, NP    Physical Exam: Vitals:   04/02/18 0845 04/02/18 0945 04/02/18 1040 04/02/18 1100  BP: (!) 163/98 (!) 188/99 (!) 188/126   Pulse: 85 84  (!) 105  Resp: 15 11    Temp:      TempSrc:      SpO2: 100% 100%  99%     General:  Appears calm and comfortable and is  NAD Eyes:  PERRL, EOMI, normal lids, iris ENT:  grossly normal hearing, lips & tongue, mmm Neck:  no LAD, masses or thyromegaly  Cardiovascular:  RRR, no m/r/g.  Respiratory:   CTA bilaterally with no wheezes/rales/rhonchi.  Normal respiratory effort. Abdomen:  soft, NT, ND, NABS Skin: ulceration on the left dorsal foot, about 2 cm, with purulence at the base but without surrounding erythema; it does appear to be larger and more purulent than on 7/21 despite abx     Musculoskeletal:  grossly normal tone BUE/BLE, good ROM, no bony abnormality Psychiatric: grossly normal mood and affect, speech fluent and appropriate, AOx3    Radiological Exams on Admission: Dg Chest 2 View  Result Date: 04/02/2018 CLINICAL DATA:  Acute onset of left upper chest pain and mild shortness of breath. EXAM: CHEST - 2 VIEW COMPARISON:  Chest radiograph performed 09/08/2017 FINDINGS: The lungs are well-aerated. Minimal left basilar atelectasis is noted. There is no evidence of pleural effusion or pneumothorax. The heart is borderline normal in size. No acute osseous abnormalities are seen. IMPRESSION: Minimal left basilar atelectasis noted; lungs otherwise clear. Electronically Signed   By: Garald Balding M.D.   On: 04/02/2018 02:23    EKG: Independently reviewed.  NSR with rate 86; PACs, LVH; nonspecific ST changes with no evidence of acute ischemia   Labs on Admission: I have personally reviewed the available labs and imaging studies at the time of the admission.  Pertinent labs:   K+ 2.9 Glucose 207 BUN 17/Creatinine 1.63/GFR 37; 5/1.06/>60 on 7/21 WBC 11.1 Hgb 11.7 INR 0.96 Troponin 0.05, 0.00 Heme positive HCG negative  Assessment/Plan Principal Problem:   GI bleeding Active Problems:   Chest pain   Hypertension   Diabetes mellitus type 2 with complications, uncontrolled (HCC)   Hyperlipidemia   Tobacco use   OSA (obstructive sleep apnea)   Schizoaffective disorder, bipolar type (HCC)    Acute renal failure (ARF) (HCC)   Hypokalemia   Polysubstance abuse (HCC)   Diabetic foot ulcer (HCC)   Lower GI bleeding -Differential to include bleeding hemorrhoids, bacterial infectious colitis with likely pathogen Campylobacter jejuni, diverticular disease, and AVM (albought the patient denies massive bleeding and her vitals are not consistent with massive acute blood loss).  -GI consult requested.  -Overnight Observation  -Follow CBC; transfuse for Hgb <7 -Continue to monitor for recurrent bleeding   Chest pain -Patient with substernal chest pressure that came on following blood BMs and resolved spontaneously -CXR unremarkable.   -Initial cardiac troponin negative x 2.  -EKG not indicative of acute ischemia.   -Will plan to place in observation status on telemetry to rule out ACS by overnight observation.  -cycle troponin 1 additional time -Risk factor stratification with HgbA1c and FLP; will also check TSH and UDS -Currently low suspicion for ACS  Acute renal failure -Likely related to prerenal azotemia in the setting of GI bleed -Replete with IVF and follow daily BMP for now  Diabetic foot ulcer -This is concerning for osteomyelitis -She had negative xrays in June and on 7/21 so will not repeat at this time -Antibiotics with Rocephin and Flagyl per diabetic foot ulcer order set; she failed Keflex as an outpatient -Check ESR, CRP, prealbumin -With negative X-ray, will need MRI to assess for osteomyelitis -Diabetic foot infection order set utilized, including orders for CM, SW, DM coordinator, wound care, and nutrition consult. -Goal would be for glucose <150 to facilitate wound healing. -Excellent BP control is needed   Hypokalemia -Replete with 40 mEq PO KCl x 2 and recheck in AM  HTN -Hold Lisinopril due to renal dysfunction -Continue hydralazine -Patient with suboptimal control while  in the ER -Consider adding Norvasc -Will also add prn  hydralazine  DM -Glucose 152 -Diet controlled -Last A1c was 5.2 -There is no indication to start medication at this time, but will cover with SSI for now  Polysubstance abuse -Cessation encouraged; this should be encouraged on an ongoing basis -UDS ordered  Schizoaffective d/o -Continue Abilify  OSA -She is scheduled to get her CPAP machine soon  Tobacco dependence -Encourage cessation.  This was discussed with the patient and should be reviewed on an ongoing basis.   -Patch declined by patient.   DVT prophylaxis: Lovenox Code Status: Full - confirmed with patient Family Communication: None present Disposition Plan:  Home once clinically improved Consults called: GI; SW/CM/Wound care/diabetes coordinator/nutrition  Admission status: It is my clinical opinion that referral for OBSERVATION is reasonable and necessary in this patient based on the above information provided. The aforementioned taken together are felt to place the patient at high risk for further clinical deterioration. However it is anticipated that the patient may be medically stable for discharge from the hospital within 24 to 48 hours.    Karmen Bongo MD [facility]  If note is complete, please contact covering daytime or nighttime physician. [email or web address] Password [email or web address]  04/02/2018, 11:11 AM

## 2018-04-02 NOTE — Consult Note (Addendum)
Consultation  Referring Provider:   Dr. Lorin Mercy Primary Care Physician:  Patient, No Pcp Per Primary Gastroenterologist: Althia Forts        Reason for Consultation: Rectal bleeding            HPI:   Diamond Rice is a 47 y.o. female with a past medical history of anxiety, COPD, shortness of breath, stroke and type 2 diabetes, who presented to the ER today with a complaint of chest pain and hematochezia.    Today, explains that she has felt constipated for the past 4 days, which she blames on current antibiotic usage, apparently took milk of magnesia last evening and did have multiple loose liquid stools, then this morning awoke and felt she needed to have another BM but passed nothing but some bright red blood, this occurred 3-4 times per her. When standing from the toilet afterwards she felt like she was going to pass out and her heart was racing so she called 911.  No further BM or blood since arrival around 3 am.    Medical history significant for being on her second round of Cephalaxen for a foot ulcer on her left foot, also taking Oxycodone for this.   Denies previous history of GI symptoms, nausea, vomiting, rectal or abdominal pain.  ER course: Guaiac positive stool, hemoglobin 11.7--> 10.4, potassium low at 2.9  Past Medical History:  Diagnosis Date  . Anginal pain ([place])   . Anxiety   . Chronic lower back pain 04/17/2012   "just got over some; catched when I walked"  . COPD (chronic obstructive pulmonary disease) ([place])   . Depression   . Headache(784.0) 04/17/2012   "~ qod; lately waking up in am w/one"  . Heart attack ([place])   . High cholesterol   . Hypertension   . Migraines 04/17/2012  . Obesity   . Shortness of breath 04/17/2012   "all the time"  . Sleep apnea    scheduled to get her CPAP machine this week  . Stroke ([place])   . Type II diabetes mellitus ([place]) 08/28/2002    Past Surgical History:  Procedure Laterality Date  . CARDIAC CATHETERIZATION  ~ 2011     Family History  Problem Relation Age of Onset  . Stroke Brother 53  . Stroke Brother 7  . Heart attack Mother 82  . Stroke Father 42    Social History   Tobacco Use  . Smoking status: Current Every Day Smoker    Packs/day: 0.25    Years: 28.00    Pack years: 7.00    Types: Cigarettes  . Smokeless tobacco: Never Used  Substance Use Topics  . Alcohol use: No    Alcohol/week: 0.0 oz    Comment: rare  . Drug use: Yes    Types: Marijuana, "Crack" cocaine    Comment: marijuana last use days ago; last cocaine several months ago    Prior to Admission medications   Medication Sig Start Date End Date Taking? Authorizing Provider  ARIPiprazole (ABILIFY) 15 MG tablet Take 1 tablet (15 mg total) by mouth at bedtime. 01/15/18  Yes Lord, Asa Saunas, NP  cephALEXin (KEFLEX) 500 MG capsule Take 1 capsule (500 mg total) by mouth 3 (three) times daily. 03/17/18  Yes Hayden Rasmussen, MD  cloNIDine (CATAPRES) 0.3 MG tablet Take 1 tablet (0.3 mg total) by mouth 3 (three) times daily. 01/15/18  Yes Patrecia Pour, NP  hydrALAZINE (APRESOLINE) 50 MG tablet Take 1 tablet (  50 mg total) by mouth every 8 (eight) hours. 01/15/18  Yes Patrecia Pour, NP  Insulin Detemir (LEVEMIR) 100 UNIT/ML Pen Inject 15 Units into the skin 2 (two) times daily.    Yes [provider]  lisinopril (PRINIVIL,ZESTRIL) 40 MG tablet Take 1 tablet (40 mg total) by mouth daily. For high blood pressure 01/15/18  Yes Lord, Asa Saunas, NP  oxyCODONE-acetaminophen (PERCOCET/ROXICET) 5-325 MG tablet Take 1 tablet by mouth every 6 (six) hours as needed for severe pain. 03/17/18  Yes Hayden Rasmussen, MD    Current Facility-Administered Medications  Medication Dose Route Frequency Provider Last Rate Last Dose  . 0.9 %  sodium chloride infusion   Intravenous Continuous Karmen Bongo, MD      . acetaminophen (TYLENOL) tablet 650 mg  650 mg Oral Q6H PRN Karmen Bongo, MD       Or  . acetaminophen (TYLENOL) suppository  650 mg  650 mg Rectal Q6H PRN Karmen Bongo, MD      . ARIPiprazole (ABILIFY) tablet 15 mg  15 mg Oral QHS Karmen Bongo, MD      . cloNIDine (CATAPRES) tablet 0.3 mg  0.3 mg Oral TID Karmen Bongo, MD      . hydrALAZINE (APRESOLINE) tablet 50 mg  50 mg Oral Q8H Karmen Bongo, MD      . insulin aspart (novoLOG) injection 0-15 Units  0-15 Units Subcutaneous TID WC Karmen Bongo, MD      . insulin aspart (novoLOG) injection 0-5 Units  0-5 Units Subcutaneous QHS Karmen Bongo, MD      . Insulin Detemir (LEVEMIR) FlexPen 15 Units  15 Units Subcutaneous BID Karmen Bongo, MD      . ondansetron [facility]) tablet 4 mg  4 mg Oral Q6H PRN Karmen Bongo, MD       Or  . ondansetron [facility]) injection 4 mg  4 mg Intravenous Q6H PRN Karmen Bongo, MD      . oxyCODONE-acetaminophen (PERCOCET/ROXICET) 5-325 MG per tablet 1 tablet  1 tablet Oral Q6H PRN Karmen Bongo, MD      . sodium chloride flush (NS) 0.9 % injection 3 mL  3 mL Intravenous Q12H Karmen Bongo, MD       Current Outpatient Medications  Medication Sig Dispense Refill  . ARIPiprazole (ABILIFY) 15 MG tablet Take 1 tablet (15 mg total) by mouth at bedtime. 30 tablet 0  . cephALEXin (KEFLEX) 500 MG capsule Take 1 capsule (500 mg total) by mouth 3 (three) times daily. 30 capsule 0  . cloNIDine (CATAPRES) 0.3 MG tablet Take 1 tablet (0.3 mg total) by mouth 3 (three) times daily. 90 tablet 0  . hydrALAZINE (APRESOLINE) 50 MG tablet Take 1 tablet (50 mg total) by mouth every 8 (eight) hours. 90 tablet 0  . Insulin Detemir (LEVEMIR) 100 UNIT/ML Pen Inject 15 Units into the skin 2 (two) times daily.     Marland Kitchen lisinopril (PRINIVIL,ZESTRIL) 40 MG tablet Take 1 tablet (40 mg total) by mouth daily. For high blood pressure 30 tablet 0  . oxyCODONE-acetaminophen (PERCOCET/ROXICET) 5-325 MG tablet Take 1 tablet by mouth every 6 (six) hours as needed for severe pain. 10 tablet 0    Allergies as of 04/02/2018 - Review Complete 04/02/2018   Allergen Reaction Noted  . Morphine and related Hives 10/23/2011     Review of Systems:    Constitutional: No weight loss, fever or chills Skin: No rash Cardiovascular: No chest pain Respiratory: No SOB  Gastrointestinal: See HPI and otherwise negative  Genitourinary: No dysuria  Neurological: No headache, dizziness or syncope Musculoskeletal: No new muscle or joint pain Hematologic: No bruising Psychiatric: No history of depression or anxiety    Physical Exam:  Vital signs in last 24 hours: Temp:  [98.3 F (36.8 C)] 98.3 F (36.8 C) (08/06 0154) Pulse Rate:  [66-85] 85 (08/06 0845) Resp:  [13-20] 15 (08/06 0845) BP: (137-163)/(90-109) 163/98 (08/06 0845) SpO2:  [97 %-100 %] 100 % (08/06 0845)   General:   Pleasant obese AA female appears to be in NAD, Well developed, Well nourished, alert and cooperative Head:  Normocephalic and atraumatic. Eyes:   PEERL, EOMI. No icterus. Conjunctiva pink. Ears:  Normal auditory acuity. Neck:  Supple Throat: Oral cavity and pharynx without inflammation, swelling or lesion. Teeth in good condition. Lungs: Respirations even and unlabored. Lungs clear to auscultation bilaterally.   No wheezes, crackles, or rhonchi.  Heart: Normal S1, S2. No MRG. Regular rate and rhythm. No peripheral edema, cyanosis or pallor.  Abdomen:  Soft, nondistended, nontender. No rebound or guarding. Normal bowel sounds. No appreciable masses or hepatomegaly. Rectal:  External: brb residue, no obvious hemorrhoids or fissures; Internal: no mass- brown stool with brb- guaiac positve per ER physician Msk:  Symmetrical without gross deformities. Peripheral pulses intact.  Extremities:  Without edema, no deformity or joint abnormality. +ulcer on dorsal aspect of left foot Neurologic:  Alert and  oriented x4;  grossly normal neurologically.  Skin:   Dry and intact without significant lesions or rashes. Psychiatric: Demonstrates good judgement and reason without abnormal  affect or behaviors.   LAB RESULTS: Recent Labs    04/02/18 0158 04/02/18 0612  WBC 11.1*  --   HGB 11.7* 10.4*  HCT 37.8 34.3*  PLT 353  --    BMET Recent Labs    04/02/18 0158  NA 136  K 2.9*  CL 99  CO2 26  GLUCOSE 207*  BUN 17  CREATININE 1.63*  CALCIUM 9.1   PT/INR Recent Labs    04/02/18 0158  LABPROT 12.7  INR 0.96    STUDIES: Dg Chest 2 View  Result Date: 04/02/2018 CLINICAL DATA:  Acute onset of left upper chest pain and mild shortness of breath. EXAM: CHEST - 2 VIEW COMPARISON:  Chest radiograph performed 09/08/2017 FINDINGS: The lungs are well-aerated. Minimal left basilar atelectasis is noted. There is no evidence of pleural effusion or pneumothorax. The heart is borderline normal in size. No acute osseous abnormalities are seen. IMPRESSION: Minimal left basilar atelectasis noted; lungs otherwise clear. Electronically Signed   By: Garald Balding M.D.   On: 04/02/2018 02:23     Impression / Plan:   Impression: 1.  Hematochezia: slight drop in hgb from 11.7-->10.4 since admission, 3-4 episodes brb pr after constipation and MOM, guaiac positive stool, no previous colonoscopy; Consider hemorrhoids vs fissure vs stercoral ulcer? 2.  Dizziness: resolved now, only acute after bloody BM 3.  Anemia  Plan: 1. For now agree with observation overnight. If no further drop in hgb or signs of GI bleed, she can follow in our outpatient office and have colonoscopy. If further signs of acute bleeding will arrange for colonoscopy here 2. Ok for clear liquid diet today 3. Ordered CT Abomen and Pelvis with contrast for further eval 4. Continue to monitor hgb q6h with transfusion as needed 5. Please await any further recommendations from Dr. Lyndel Safe later today  Thank you for your kind consultation, we will continue to follow.  Lavone Nian [facility]  04/02/2018,  10:04 AM   Attending physician's note   I have taken an interval history, reviewed the chart and examined the  patient. I agree with the Advanced Practitioner's note, impression and recommendations.  47 year old with hematochezia associated with constipation, stable hemoglobin, hemodynamically stable.  Plan: Trend CBC, CT, thereafter we will decide regarding colonoscopy as an inpatient or as an outpatient.  Minimize narcotics.  May need MiraLAX regimen at discharge.  Carmell Austria, MD

## 2018-04-02 NOTE — Progress Notes (Signed)
CSW acknowledges consult for accessing medications for discharge. For further assistance with this matter please consult RNCM. At this time there are no further CSW needs. CSW will sign off. If new need arises please reconsult.   Virgie Dad Cristiano Capri, MSW, [facility] Clinical Social Worker [phone]

## 2018-04-02 NOTE — ED Triage Notes (Signed)
Patient arrived with EMS from home reports left upper chest pain this evening with mild SOB , no nausea or diaphoresis , she received 1 NTG sl ; Zofran 4 mg IV and NS IV 300 ml by EMS , pt. added bloody stools this evening , denies abdominal pain .

## 2018-04-02 NOTE — Progress Notes (Signed)
Patient's home  meds returned to Pharmacy. Receipt in pt's Chart

## 2018-04-02 NOTE — ED Notes (Signed)
Patient transported to CT 

## 2018-04-02 NOTE — ED Provider Notes (Signed)
[facility] DEPARTMENT Provider Note   CSN: [id number] Arrival date & time: 04/02/18  0141     History   Chief Complaint Chief Complaint  Patient presents with  . Chest Pain  . Hematochezia    HPI Diamond Rice is a 47 y.o. female.  HPI  This is a 47 year old female with a history of COPD, hypertension, hypercholesterolemia, diabetes, schizoaffective disorder who presents with chest pain and bloody stools.  Patient reports that she has felt constipated for the last 4 days.  She took milk of magnesia.  Last evening she had onset of gross blood per rectum when attempting to have a bowel movement.  She states that there was not any significant stool but it was mostly bloody.  She has never had any history of bloody stools in the past.  No history of colonoscopy.  No known diverticular disease.  Denies any abdominal pain.  Denies any significant NSAID or call use.  Patient states that after she got out of the bathroom she felt very dizzy and had onset of chest pain.  She states there was anterior pressure-like.  She is currently not having any chest pain.  Denies nausea, vomiting.  Past Medical History:  Diagnosis Date  . Anginal pain ([place])   . Anxiety   . Chronic lower back pain 04/17/2012   "just got over some; catched when I walked"  . COPD (chronic obstructive pulmonary disease) ([place])   . Depression   . Headache(784.0) 04/17/2012   "~ qod; lately waking up in am w/one"  . Heart attack ([place])   . High cholesterol   . Hypertension   . Migraines 04/17/2012  . Obesity   . Shortness of breath 04/17/2012   "all the time"  . Sleep apnea   . Stroke ([place])   . Type II diabetes mellitus ([place]) 08/28/2002    Patient Active Problem List   Diagnosis Date Noted  . Cannabis use disorder, moderate, dependence ([place]) 05/19/2017  . Cocaine use disorder, moderate, dependence ([place]) 05/19/2017  . Schizoaffective disorder, bipolar type ([place]) 10/07/2015  . OSA  (obstructive sleep apnea) 06/09/2015  . Vitamin D deficiency 01/06/2015  . Bilateral knee pain 01/05/2015  . Numbness and tingling in right hand 01/05/2015  . Insomnia 12/07/2014  . Right-sided lacunar infarction ([place]) 09/15/2014  . Left hemiparesis ([place]) 09/15/2014  . Dyspnea   . Back pain 09/11/2013  . Psychoactive substance-induced organic mood disorder ([place]) 05/28/2013  . Cocaine abuse with cocaine-induced mood disorder ([place]) 05/28/2013  . Cannabis dependence with cannabis-induced anxiety disorder ([place]) 05/28/2013  . Morbid obesity with BMI of 45.0-49.9, adult ([place]) 04/25/2012  . Chest pain 04/17/2012  . Hypertension 04/17/2012  . Diabetes mellitus type 2 with complications, uncontrolled ([place]) 04/17/2012  . Hyperlipidemia 04/17/2012  . Tobacco use 04/17/2012    Past Surgical History:  Procedure Laterality Date  . CARDIAC CATHETERIZATION  ~ 2011     OB History   None      Home Medications    Prior to Admission medications   Medication Sig Start Date End Date Taking? Authorizing Provider  ARIPiprazole (ABILIFY) 15 MG tablet Take 1 tablet (15 mg total) by mouth at bedtime. 01/15/18  Yes Lord, Asa Saunas, NP  cephALEXin (KEFLEX) 500 MG capsule Take 1 capsule (500 mg total) by mouth 3 (three) times daily. 03/17/18  Yes Hayden Rasmussen, MD  cloNIDine (CATAPRES) 0.3 MG tablet Take 1 tablet (0.3 mg total) by mouth 3 (three) times daily.  01/15/18  Yes Patrecia Pour, NP  hydrALAZINE (APRESOLINE) 50 MG tablet Take 1 tablet (50 mg total) by mouth every 8 (eight) hours. 01/15/18  Yes Patrecia Pour, NP  Insulin Detemir (LEVEMIR) 100 UNIT/ML Pen Inject 15 Units into the skin 2 (two) times daily.    Yes [provider]  lisinopril (PRINIVIL,ZESTRIL) 40 MG tablet Take 1 tablet (40 mg total) by mouth daily. For high blood pressure 01/15/18  Yes Lord, Asa Saunas, NP  oxyCODONE-acetaminophen (PERCOCET/ROXICET) 5-325 MG tablet Take 1 tablet by mouth every 6 (six) hours as needed for  severe pain. 03/17/18  Yes Hayden Rasmussen, MD  ARIPiprazole ER (ABILIFY MAINTENA) 300 MG SRER injection Inject 1.5 mLs (300 mg total) into the muscle every 28 (twenty-eight) days. For mood control Patient not taking: Reported on 03/17/2018 01/15/18   Patrecia Pour, NP    Family History Family History  Problem Relation Age of Onset  . Stroke Brother 24  . Stroke Brother 49  . Heart attack Mother 28  . Stroke Father 94    Social History Social History   Tobacco Use  . Smoking status: Current Some Day Smoker    Packs/day: 0.00    Years: 10.00    Pack years: 0.00    Types: Cigarettes    Last attempt to quit: 12/07/2014    Years since quitting: 3.3  . Smokeless tobacco: Never Used  . Tobacco comment: smoke a cigarette a day   Substance Use Topics  . Alcohol use: No    Alcohol/week: 0.0 oz    Comment: rare  . Drug use: Yes    Types: Marijuana, "Crack" cocaine     Allergies   Morphine and related   Review of Systems Review of Systems  Constitutional: Negative for fever.  Respiratory: Negative for shortness of breath.   Cardiovascular: Positive for chest pain. Negative for leg swelling.  Gastrointestinal: Positive for blood in stool and constipation. Negative for abdominal pain, nausea and vomiting.  Genitourinary: Negative for dysuria.  Neurological: Positive for dizziness and light-headedness. Negative for numbness.  All other systems reviewed and are negative.    Physical Exam Updated Vital Signs BP (!) 156/98   Pulse 66   Temp 98.3 F (36.8 C) (Oral)   Resp 20   LMP 03/24/2018   SpO2 100%   Physical Exam  Constitutional: She is oriented to person, place, and time. She appears well-developed and well-nourished.  Obese, nontoxic-appearing  HENT:  Head: Normocephalic and atraumatic.  Eyes: Pupils are equal, round, and reactive to light.  Cardiovascular: Normal rate, regular rhythm, normal heart sounds and normal pulses.  No murmur  heard. Pulmonary/Chest: Effort normal and breath sounds normal. No respiratory distress. She has no wheezes.  Abdominal: Soft. Bowel sounds are normal. She exhibits no distension. There is no tenderness.  Genitourinary: Rectal exam shows guaiac positive stool. Rectal exam shows no external hemorrhoid, no internal hemorrhoid and no fissure.  Genitourinary Comments: Grossly bloody rectal exam  Neurological: She is alert and oriented to person, place, and time.  Skin: Skin is warm and dry.  Psychiatric: She has a normal mood and affect.  Nursing note and vitals reviewed.    ED Treatments / Results  Labs (all labs ordered are listed, but only abnormal results are displayed) Labs Reviewed  BASIC METABOLIC PANEL - Abnormal; Notable for the following components:      Result Value   Potassium 2.9 (*)    Glucose, Bld 207 (*)  Creatinine, Ser 1.63 (*)    GFR calc non Af Amer 37 (*)    GFR calc Af Amer 43 (*)    All other components within normal limits  CBC - Abnormal; Notable for the following components:   WBC 11.1 (*)    Hemoglobin 11.7 (*)    MCV 75.1 (*)    MCH 23.3 (*)    All other components within normal limits  HEMOGLOBIN AND HEMATOCRIT, BLOOD - Abnormal; Notable for the following components:   Hemoglobin 10.4 (*)    HCT 34.3 (*)    All other components within normal limits  POC OCCULT BLOOD, ED - Abnormal; Notable for the following components:   Fecal Occult Bld POSITIVE (*)    All other components within normal limits  PROTIME-INR  I-STAT TROPONIN, ED  I-STAT BETA HCG BLOOD, ED ([facility], [facility], [facility] ONLY)  I-STAT TROPONIN, ED  SAMPLE TO BLOOD BANK    EKG EKG Interpretation  Date/Time:  Tuesday April 02 2018 01:56:02 EDT Ventricular Rate:  86 PR Interval:  166 QRS Duration: 84 QT Interval:  410 QTC Calculation: 490 R Axis:   -22 Text Interpretation:  Sinus rhythm with Possible Premature atrial complexes with Abberant conduction Left ventricular hypertrophy with  repolarization abnormality Septal infarct , age undetermined Abnormal ECG Confirmed by Thayer Jew [phone]) on 04/02/2018 5:09:18 AM   Radiology Dg Chest 2 View  Result Date: 04/02/2018 CLINICAL DATA:  Acute onset of left upper chest pain and mild shortness of breath. EXAM: CHEST - 2 VIEW COMPARISON:  Chest radiograph performed 09/08/2017 FINDINGS: The lungs are well-aerated. Minimal left basilar atelectasis is noted. There is no evidence of pleural effusion or pneumothorax. The heart is borderline normal in size. No acute osseous abnormalities are seen. IMPRESSION: Minimal left basilar atelectasis noted; lungs otherwise clear. Electronically Signed   By: Garald Balding M.D.   On: 04/02/2018 02:23    Procedures Procedures (including critical care time)  Medications Ordered in ED Medications  sodium chloride 0.9 % bolus 1,000 mL (1,000 mLs Intravenous New Bag/Given 04/02/18 0608)     Initial Impression / Assessment and Plan / ED Course  I have reviewed the triage vital signs and the nursing notes.  Pertinent labs & imaging results that were available during my care of the patient were reviewed by me and considered in my medical decision making (see chart for details).     She presents with onset of bright red blood per rectum.  Also reports chest pain.  She is overall nontoxic-appearing.  Vital signs are reassuring.  No significant tachycardia.  No hypotension.  She has evidence of gross blood.  No hemorrhoids or rectal lesions or fissures noted.  Abdomen is soft and nontender.  Patient was typed and screened.  Initial hemoglobin is at baseline of 11.7.  Initial troponin is negative.  EKG has nonspecific changes.  Patient does have multiple risk factors for ACS.  She was orthostatic with heart rate of 107 upon standing.  She stated that she felt very dizzy.  Patient was given fluids.  Repeat troponin is negative and hemoglobin is now 10.4.  This is likely with an error; however, given acute  bleeding on exam and dizziness upon standing with significant tachycardia, will admit for observation.  Patient may need GI evaluation but will defer to the admitting team as I do not feel she needs emergent GI evaluation..  Suspect diverticulosis or colitis.    Final Clinical Impressions(s) / ED Diagnoses   Final  diagnoses:  Lower GI bleed  Nonspecific chest pain    ED Discharge Orders    None       Merryl Hacker, MD 04/02/18 [phone]

## 2018-04-02 NOTE — H&P (View-Only) (Signed)
Consultation  Referring Provider:   Dr. Lorin Mercy Primary Care Physician:  Patient, No Pcp Per Primary Gastroenterologist: Althia Forts        Reason for Consultation: Rectal bleeding            HPI:   Diamond Rice is a 47 y.o. female with a past medical history of anxiety, COPD, shortness of breath, stroke and type 2 diabetes, who presented to the ER today with a complaint of chest pain and hematochezia.    Today, explains that she has felt constipated for the past 4 days, which she blames on current antibiotic usage, apparently took milk of magnesia last evening and did have multiple loose liquid stools, then this morning awoke and felt she needed to have another BM but passed nothing but some bright red blood, this occurred 3-4 times per her. When standing from the toilet afterwards she felt like she was going to pass out and her heart was racing so she called 911.  No further BM or blood since arrival around 3 am.    Medical history significant for being on her second round of Cephalaxen for a foot ulcer on her left foot, also taking Oxycodone for this.   Denies previous history of GI symptoms, nausea, vomiting, rectal or abdominal pain.  ER course: Guaiac positive stool, hemoglobin 11.7--> 10.4, potassium low at 2.9  Past Medical History:  Diagnosis Date  . Anginal pain ([place])   . Anxiety   . Chronic lower back pain 04/17/2012   "just got over some; catched when I walked"  . COPD (chronic obstructive pulmonary disease) ([place])   . Depression   . Headache(784.0) 04/17/2012   "~ qod; lately waking up in am w/one"  . Heart attack ([place])   . High cholesterol   . Hypertension   . Migraines 04/17/2012  . Obesity   . Shortness of breath 04/17/2012   "all the time"  . Sleep apnea    scheduled to get her CPAP machine this week  . Stroke ([place])   . Type II diabetes mellitus ([place]) 08/28/2002    Past Surgical History:  Procedure Laterality Date  . CARDIAC CATHETERIZATION  ~ 2011     Family History  Problem Relation Age of Onset  . Stroke Brother 63  . Stroke Brother 70  . Heart attack Mother 38  . Stroke Father 58    Social History   Tobacco Use  . Smoking status: Current Every Day Smoker    Packs/day: 0.25    Years: 28.00    Pack years: 7.00    Types: Cigarettes  . Smokeless tobacco: Never Used  Substance Use Topics  . Alcohol use: No    Alcohol/week: 0.0 oz    Comment: rare  . Drug use: Yes    Types: Marijuana, "Crack" cocaine    Comment: marijuana last use days ago; last cocaine several months ago    Prior to Admission medications   Medication Sig Start Date End Date Taking? Authorizing Provider  ARIPiprazole (ABILIFY) 15 MG tablet Take 1 tablet (15 mg total) by mouth at bedtime. 01/15/18  Yes Lord, Asa Saunas, NP  cephALEXin (KEFLEX) 500 MG capsule Take 1 capsule (500 mg total) by mouth 3 (three) times daily. 03/17/18  Yes Hayden Rasmussen, MD  cloNIDine (CATAPRES) 0.3 MG tablet Take 1 tablet (0.3 mg total) by mouth 3 (three) times daily. 01/15/18  Yes Patrecia Pour, NP  hydrALAZINE (APRESOLINE) 50 MG tablet Take 1 tablet (  50 mg total) by mouth every 8 (eight) hours. 01/15/18  Yes Patrecia Pour, NP  Insulin Detemir (LEVEMIR) 100 UNIT/ML Pen Inject 15 Units into the skin 2 (two) times daily.    Yes [provider]  lisinopril (PRINIVIL,ZESTRIL) 40 MG tablet Take 1 tablet (40 mg total) by mouth daily. For high blood pressure 01/15/18  Yes Lord, Asa Saunas, NP  oxyCODONE-acetaminophen (PERCOCET/ROXICET) 5-325 MG tablet Take 1 tablet by mouth every 6 (six) hours as needed for severe pain. 03/17/18  Yes Hayden Rasmussen, MD    Current Facility-Administered Medications  Medication Dose Route Frequency Provider Last Rate Last Dose  . 0.9 %  sodium chloride infusion   Intravenous Continuous Karmen Bongo, MD      . acetaminophen (TYLENOL) tablet 650 mg  650 mg Oral Q6H PRN Karmen Bongo, MD       Or  . acetaminophen (TYLENOL) suppository  650 mg  650 mg Rectal Q6H PRN Karmen Bongo, MD      . ARIPiprazole (ABILIFY) tablet 15 mg  15 mg Oral QHS Karmen Bongo, MD      . cloNIDine (CATAPRES) tablet 0.3 mg  0.3 mg Oral TID Karmen Bongo, MD      . hydrALAZINE (APRESOLINE) tablet 50 mg  50 mg Oral Q8H Karmen Bongo, MD      . insulin aspart (novoLOG) injection 0-15 Units  0-15 Units Subcutaneous TID WC Karmen Bongo, MD      . insulin aspart (novoLOG) injection 0-5 Units  0-5 Units Subcutaneous QHS Karmen Bongo, MD      . Insulin Detemir (LEVEMIR) FlexPen 15 Units  15 Units Subcutaneous BID Karmen Bongo, MD      . ondansetron [facility]) tablet 4 mg  4 mg Oral Q6H PRN Karmen Bongo, MD       Or  . ondansetron [facility]) injection 4 mg  4 mg Intravenous Q6H PRN Karmen Bongo, MD      . oxyCODONE-acetaminophen (PERCOCET/ROXICET) 5-325 MG per tablet 1 tablet  1 tablet Oral Q6H PRN Karmen Bongo, MD      . sodium chloride flush (NS) 0.9 % injection 3 mL  3 mL Intravenous Q12H Karmen Bongo, MD       Current Outpatient Medications  Medication Sig Dispense Refill  . ARIPiprazole (ABILIFY) 15 MG tablet Take 1 tablet (15 mg total) by mouth at bedtime. 30 tablet 0  . cephALEXin (KEFLEX) 500 MG capsule Take 1 capsule (500 mg total) by mouth 3 (three) times daily. 30 capsule 0  . cloNIDine (CATAPRES) 0.3 MG tablet Take 1 tablet (0.3 mg total) by mouth 3 (three) times daily. 90 tablet 0  . hydrALAZINE (APRESOLINE) 50 MG tablet Take 1 tablet (50 mg total) by mouth every 8 (eight) hours. 90 tablet 0  . Insulin Detemir (LEVEMIR) 100 UNIT/ML Pen Inject 15 Units into the skin 2 (two) times daily.     Marland Kitchen lisinopril (PRINIVIL,ZESTRIL) 40 MG tablet Take 1 tablet (40 mg total) by mouth daily. For high blood pressure 30 tablet 0  . oxyCODONE-acetaminophen (PERCOCET/ROXICET) 5-325 MG tablet Take 1 tablet by mouth every 6 (six) hours as needed for severe pain. 10 tablet 0    Allergies as of 04/02/2018 - Review Complete 04/02/2018   Allergen Reaction Noted  . Morphine and related Hives 10/23/2011     Review of Systems:    Constitutional: No weight loss, fever or chills Skin: No rash Cardiovascular: No chest pain Respiratory: No SOB  Gastrointestinal: See HPI and otherwise negative  Genitourinary: No dysuria  Neurological: No headache, dizziness or syncope Musculoskeletal: No new muscle or joint pain Hematologic: No bruising Psychiatric: No history of depression or anxiety    Physical Exam:  Vital signs in last 24 hours: Temp:  [98.3 F (36.8 C)] 98.3 F (36.8 C) (08/06 0154) Pulse Rate:  [66-85] 85 (08/06 0845) Resp:  [13-20] 15 (08/06 0845) BP: (137-163)/(90-109) 163/98 (08/06 0845) SpO2:  [97 %-100 %] 100 % (08/06 0845)   General:   Pleasant obese AA female appears to be in NAD, Well developed, Well nourished, alert and cooperative Head:  Normocephalic and atraumatic. Eyes:   PEERL, EOMI. No icterus. Conjunctiva pink. Ears:  Normal auditory acuity. Neck:  Supple Throat: Oral cavity and pharynx without inflammation, swelling or lesion. Teeth in good condition. Lungs: Respirations even and unlabored. Lungs clear to auscultation bilaterally.   No wheezes, crackles, or rhonchi.  Heart: Normal S1, S2. No MRG. Regular rate and rhythm. No peripheral edema, cyanosis or pallor.  Abdomen:  Soft, nondistended, nontender. No rebound or guarding. Normal bowel sounds. No appreciable masses or hepatomegaly. Rectal:  External: brb residue, no obvious hemorrhoids or fissures; Internal: no mass- brown stool with brb- guaiac positve per ER physician Msk:  Symmetrical without gross deformities. Peripheral pulses intact.  Extremities:  Without edema, no deformity or joint abnormality. +ulcer on dorsal aspect of left foot Neurologic:  Alert and  oriented x4;  grossly normal neurologically.  Skin:   Dry and intact without significant lesions or rashes. Psychiatric: Demonstrates good judgement and reason without abnormal  affect or behaviors.   LAB RESULTS: Recent Labs    04/02/18 0158 04/02/18 0612  WBC 11.1*  --   HGB 11.7* 10.4*  HCT 37.8 34.3*  PLT 353  --    BMET Recent Labs    04/02/18 0158  NA 136  K 2.9*  CL 99  CO2 26  GLUCOSE 207*  BUN 17  CREATININE 1.63*  CALCIUM 9.1   PT/INR Recent Labs    04/02/18 0158  LABPROT 12.7  INR 0.96    STUDIES: Dg Chest 2 View  Result Date: 04/02/2018 CLINICAL DATA:  Acute onset of left upper chest pain and mild shortness of breath. EXAM: CHEST - 2 VIEW COMPARISON:  Chest radiograph performed 09/08/2017 FINDINGS: The lungs are well-aerated. Minimal left basilar atelectasis is noted. There is no evidence of pleural effusion or pneumothorax. The heart is borderline normal in size. No acute osseous abnormalities are seen. IMPRESSION: Minimal left basilar atelectasis noted; lungs otherwise clear. Electronically Signed   By: Garald Balding M.D.   On: 04/02/2018 02:23     Impression / Plan:   Impression: 1.  Hematochezia: slight drop in hgb from 11.7-->10.4 since admission, 3-4 episodes brb pr after constipation and MOM, guaiac positive stool, no previous colonoscopy; Consider hemorrhoids vs fissure vs stercoral ulcer? 2.  Dizziness: resolved now, only acute after bloody BM 3.  Anemia  Plan: 1. For now agree with observation overnight. If no further drop in hgb or signs of GI bleed, she can follow in our outpatient office and have colonoscopy. If further signs of acute bleeding will arrange for colonoscopy here 2. Ok for clear liquid diet today 3. Ordered CT Abomen and Pelvis with contrast for further eval 4. Continue to monitor hgb q6h with transfusion as needed 5. Please await any further recommendations from Dr. Lyndel Safe later today  Thank you for your kind consultation, we will continue to follow.  Lavone Nian [facility]  04/02/2018,  10:04 AM   Attending physician's note   I have taken an interval history, reviewed the chart and examined the  patient. I agree with the Advanced Practitioner's note, impression and recommendations.  47 year old with hematochezia associated with constipation, stable hemoglobin, hemodynamically stable.  Plan: Trend CBC, CT, thereafter we will decide regarding colonoscopy as an inpatient or as an outpatient.  Minimize narcotics.  May need MiraLAX regimen at discharge.  Carmell Austria, MD

## 2018-04-02 NOTE — Consult Note (Signed)
[place] Nurse wound consult note Reason for Consult:Nonhealing wound to left dorsal foot.  Trauma from 01/2018.  X ray from 03/17/18 did not show osteomyelitis.   Wound type:infectious, trauma Pressure Injury POA: NA Measurement: 2 cm x 2.6 cm x 0.3 cm with yellow devitalized adherent slough present.   Wound bed:100% thin adherent slough Drainage (amount, consistency, odor) none  Dry wound bed Periwound:pain and warmth.  Pain extends to bottom of foot Dressing procedure/placement/frequency:  Cleanse wound to left dorsal foot with NS.  Apply Santyl to wound bed.  Cover with NS moist 2x2.  Secure with dry gauze and kerlix/tape.  Change daily.  Send medication home with patient.  Will not follow at this time.  Please re-consult if needed.  Domenic Moras RN BSN [place] Pager [phone]

## 2018-04-02 NOTE — ED Notes (Signed)
Pt denies CP at this time, C/O pain in left foot at wound. Pt states she got wound on her left foot in June after a fall. Reports she has upcoming wound apt on the 13th of Aug.

## 2018-04-03 ENCOUNTER — Ambulatory Visit (HOSPITAL_COMMUNITY): Payer: Medicare Other

## 2018-04-03 DIAGNOSIS — J449 Chronic obstructive pulmonary disease, unspecified: Secondary | ICD-10-CM | POA: Diagnosis present

## 2018-04-03 DIAGNOSIS — E118 Type 2 diabetes mellitus with unspecified complications: Secondary | ICD-10-CM | POA: Diagnosis not present

## 2018-04-03 DIAGNOSIS — L0390 Cellulitis, unspecified: Secondary | ICD-10-CM | POA: Diagnosis not present

## 2018-04-03 DIAGNOSIS — F250 Schizoaffective disorder, bipolar type: Secondary | ICD-10-CM | POA: Diagnosis present

## 2018-04-03 DIAGNOSIS — N179 Acute kidney failure, unspecified: Secondary | ICD-10-CM | POA: Diagnosis not present

## 2018-04-03 DIAGNOSIS — E08621 Diabetes mellitus due to underlying condition with foot ulcer: Secondary | ICD-10-CM | POA: Diagnosis not present

## 2018-04-03 DIAGNOSIS — D122 Benign neoplasm of ascending colon: Secondary | ICD-10-CM | POA: Diagnosis not present

## 2018-04-03 DIAGNOSIS — R933 Abnormal findings on diagnostic imaging of other parts of digestive tract: Secondary | ICD-10-CM

## 2018-04-03 DIAGNOSIS — E11621 Type 2 diabetes mellitus with foot ulcer: Secondary | ICD-10-CM | POA: Diagnosis not present

## 2018-04-03 DIAGNOSIS — G4733 Obstructive sleep apnea (adult) (pediatric): Secondary | ICD-10-CM | POA: Diagnosis present

## 2018-04-03 DIAGNOSIS — E1140 Type 2 diabetes mellitus with diabetic neuropathy, unspecified: Secondary | ICD-10-CM | POA: Diagnosis present

## 2018-04-03 DIAGNOSIS — I252 Old myocardial infarction: Secondary | ICD-10-CM | POA: Diagnosis not present

## 2018-04-03 DIAGNOSIS — R710 Precipitous drop in hematocrit: Secondary | ICD-10-CM | POA: Diagnosis not present

## 2018-04-03 DIAGNOSIS — I739 Peripheral vascular disease, unspecified: Secondary | ICD-10-CM | POA: Diagnosis not present

## 2018-04-03 DIAGNOSIS — I110 Hypertensive heart disease with heart failure: Secondary | ICD-10-CM | POA: Diagnosis present

## 2018-04-03 DIAGNOSIS — L97529 Non-pressure chronic ulcer of other part of left foot with unspecified severity: Secondary | ICD-10-CM | POA: Diagnosis present

## 2018-04-03 DIAGNOSIS — K922 Gastrointestinal hemorrhage, unspecified: Secondary | ICD-10-CM | POA: Diagnosis present

## 2018-04-03 DIAGNOSIS — Z720 Tobacco use: Secondary | ICD-10-CM | POA: Diagnosis not present

## 2018-04-03 DIAGNOSIS — I10 Essential (primary) hypertension: Secondary | ICD-10-CM | POA: Diagnosis not present

## 2018-04-03 DIAGNOSIS — K625 Hemorrhage of anus and rectum: Secondary | ICD-10-CM | POA: Diagnosis not present

## 2018-04-03 DIAGNOSIS — L97422 Non-pressure chronic ulcer of left heel and midfoot with fat layer exposed: Secondary | ICD-10-CM | POA: Diagnosis not present

## 2018-04-03 DIAGNOSIS — R079 Chest pain, unspecified: Secondary | ICD-10-CM | POA: Diagnosis present

## 2018-04-03 DIAGNOSIS — I5033 Acute on chronic diastolic (congestive) heart failure: Secondary | ICD-10-CM | POA: Diagnosis not present

## 2018-04-03 DIAGNOSIS — K626 Ulcer of anus and rectum: Secondary | ICD-10-CM | POA: Diagnosis not present

## 2018-04-03 DIAGNOSIS — Z6841 Body Mass Index (BMI) 40.0 and over, adult: Secondary | ICD-10-CM | POA: Diagnosis not present

## 2018-04-03 DIAGNOSIS — E1165 Type 2 diabetes mellitus with hyperglycemia: Secondary | ICD-10-CM | POA: Diagnosis not present

## 2018-04-03 DIAGNOSIS — D124 Benign neoplasm of descending colon: Secondary | ICD-10-CM | POA: Diagnosis not present

## 2018-04-03 DIAGNOSIS — E1151 Type 2 diabetes mellitus with diabetic peripheral angiopathy without gangrene: Secondary | ICD-10-CM | POA: Diagnosis not present

## 2018-04-03 DIAGNOSIS — E785 Hyperlipidemia, unspecified: Secondary | ICD-10-CM | POA: Diagnosis present

## 2018-04-03 DIAGNOSIS — K6289 Other specified diseases of anus and rectum: Secondary | ICD-10-CM | POA: Diagnosis not present

## 2018-04-03 DIAGNOSIS — L03116 Cellulitis of left lower limb: Secondary | ICD-10-CM | POA: Diagnosis not present

## 2018-04-03 DIAGNOSIS — E876 Hypokalemia: Secondary | ICD-10-CM | POA: Diagnosis present

## 2018-04-03 DIAGNOSIS — F17210 Nicotine dependence, cigarettes, uncomplicated: Secondary | ICD-10-CM | POA: Diagnosis present

## 2018-04-03 DIAGNOSIS — D62 Acute posthemorrhagic anemia: Secondary | ICD-10-CM | POA: Diagnosis not present

## 2018-04-03 LAB — GLUCOSE, CAPILLARY
Glucose-Capillary: 107 mg/dL — ABNORMAL HIGH (ref 70–99)
Glucose-Capillary: 160 mg/dL — ABNORMAL HIGH (ref 70–99)
Glucose-Capillary: 156 mg/dL — ABNORMAL HIGH (ref 70–99)
Glucose-Capillary: 75 mg/dL (ref 70–99)

## 2018-04-03 LAB — BASIC METABOLIC PANEL
Anion gap: 13 (ref 5–15)
BUN: 16 mg/dL (ref 6–20)
CALCIUM: 8.6 mg/dL — AB (ref 8.9–10.3)
CO2: 23 mmol/L (ref 22–32)
CREATININE: 1.3 mg/dL — AB (ref 0.44–1.00)
Chloride: 100 mmol/L (ref 98–111)
GFR calc non Af Amer: 49 mL/min — ABNORMAL LOW (ref >60)
GFR, EST AFRICAN AMERICAN: 57 mL/min — AB (ref >60)
Glucose, Bld: 88 mg/dL (ref 70–99)
Potassium: 3.4 mmol/L — ABNORMAL LOW (ref 3.5–5.1)
Sodium: 136 mmol/L (ref 135–145)

## 2018-04-03 LAB — CBC
HCT: 29.8 % — ABNORMAL LOW (ref 36.0–46.0)
Hemoglobin: 9.5 g/dL — ABNORMAL LOW (ref 12.0–15.0)
MCH: 23.7 pg — ABNORMAL LOW (ref 26.0–34.0)
MCHC: 31.9 g/dL (ref 30.0–36.0)
MCV: 74.3 fL — ABNORMAL LOW (ref 78.0–100.0)
PLATELETS: 292 10*3/uL (ref 150–400)
RBC: 4.01 MIL/uL (ref 3.87–5.11)
RDW: 15.6 % — ABNORMAL HIGH (ref 11.5–15.5)
WBC: 11.1 10*3/uL — ABNORMAL HIGH (ref 4.0–10.5)

## 2018-04-03 LAB — LIPID PANEL
CHOL/HDL RATIO: 4.1 ratio
CHOLESTEROL: 119 mg/dL (ref 0–200)
HDL: 29 mg/dL — ABNORMAL LOW (ref >40)
LDL Cholesterol: 72 mg/dL (ref 0–99)
TRIGLYCERIDES: 90 mg/dL (ref <150)
VLDL: 18 mg/dL (ref 0–40)

## 2018-04-03 LAB — RAPID URINE DRUG SCREEN, HOSP PERFORMED
AMPHETAMINES: NOT DETECTED
BARBITURATES: NOT DETECTED
Benzodiazepines: NOT DETECTED
Cocaine: NOT DETECTED
OPIATES: NOT DETECTED
TETRAHYDROCANNABINOL: NOT DETECTED

## 2018-04-03 LAB — HEMOGLOBIN
Hemoglobin: 8.8 g/dL — ABNORMAL LOW (ref 12.0–15.0)
Hemoglobin: 8.4 g/dL — ABNORMAL LOW (ref 12.0–15.0)

## 2018-04-03 LAB — HIV ANTIBODY (ROUTINE TESTING W REFLEX): HIV Screen 4th Generation wRfx: NONREACTIVE

## 2018-04-03 MED ORDER — PEG-KCL-NACL-NASULF-NA ASC-C 100 G PO SOLR
1.0000 | Freq: Once | ORAL | Status: AC
  Administered 2018-04-03: 200 g via ORAL
  Filled 2018-04-03: qty 1

## 2018-04-03 MED ORDER — POTASSIUM CHLORIDE CRYS ER 20 MEQ PO TBCR
20.0000 meq | EXTENDED_RELEASE_TABLET | Freq: Once | ORAL | Status: AC
  Administered 2018-04-03: 20 meq via ORAL
  Filled 2018-04-03: qty 1

## 2018-04-03 NOTE — Progress Notes (Addendum)
[facility] Progress Note   Chief Complaint:   Rectal bleeding   SUBJECTIVE:    no complaints. No BMs  / bleeding since admission   ASSESSMENT AND PLAN:   1. 47 yo female admitted with rectal bleeding after taking MOM for constipation. CT scan negative for ischemia, showed mild perirectal inflammatory changes. No further bleeding since admission but overall hgb declined significantly from 11.7 to 8.8 today. -We will proceed with inpatient colonoscopy for further evaluation of the bleeding.The risks and benefits of colonoscopy with possible polypectomy were discussed and the patient agrees to proceed.   2  DM / AKI  3. Diabetic foot ulcer, on antibiotics   Attending physician's note   I have taken an interval history, reviewed the chart and examined the patient. I agree with the Advanced Practitioner's note, impression and recommendations.   47 year old with rectal bleeding with decreased hemoglobin from 11.7 to 8.8.  CT scan showing mild perirectal inflammatory changes.  Plan: Trend CBC, colonoscopy in a.m.  Carmell Austria, MD   OBJECTIVE:     Vital signs in last 24 hours: Temp:  [97.5 F (36.4 C)-98 F (36.7 C)] 98 F (36.7 C) (08/07 0503) Pulse Rate:  [57-78] 72 (08/07 1344) Resp:  [9-22] 18 (08/07 0503) BP: (105-147)/(67-102) 124/83 (08/07 1344) SpO2:  [98 %-100 %] 100 % (08/07 0503) Weight:  [207 lb (93.9 kg)] 207 lb (93.9 kg) (08/07 1610)   General:   Alert, female in NAD EENT:  Normal hearing, non icteric sclera, conjunctive pink.  Heart:  Regular rate and rhythm; + murmurs.No lower extremity edema Pulm: Normal respiratory effort Abdomen:  Soft, nondistended, nontender.  Normal bowel sounds, no masses felt.     Neurologic:  Alert and  oriented x4;  grossly normal neurologically. Psych:  Pleasant, cooperative.  Normal mood and affect.   Intake/Output from previous day: 08/06 0701 - 08/07 0700 In: 1879.4 [I.V.:1679.4; IV Piggyback:200] Out: -   Intake/Output this shift: No intake/output data recorded.  Lab Results: Recent Labs    04/02/18 0158 04/02/18 0612 04/02/18 1624 04/02/18 2239 04/03/18 0508 04/03/18 1157  WBC 11.1*  --  11.5*  --  11.1*  --   HGB 11.7* 10.4* 9.6* 9.2* 9.5* 8.8*  HCT 37.8 34.3* 30.8*  --  29.8*  --   PLT 353  --  310  --  292  --    BMET Recent Labs    04/02/18 0158 04/03/18 0508  NA 136 136  K 2.9* 3.4*  CL 99 100  CO2 26 23  GLUCOSE 207* 88  BUN 17 16  CREATININE 1.63* 1.30*  CALCIUM 9.1 8.6*     PT/INR Recent Labs    04/02/18 0158  LABPROT 12.7  INR 0.96    Dg Chest 2 View  Result Date: 04/02/2018 CLINICAL DATA:  Acute onset of left upper chest pain and mild shortness of breath. EXAM: CHEST - 2 VIEW COMPARISON:  Chest radiograph performed 09/08/2017 FINDINGS: The lungs are well-aerated. Minimal left basilar atelectasis is noted. There is no evidence of pleural effusion or pneumothorax. The heart is borderline normal in size. No acute osseous abnormalities are seen. IMPRESSION: Minimal left basilar atelectasis noted; lungs otherwise clear. Electronically Signed   By: Garald Balding M.D.   On: 04/02/2018 02:23   Ct Abdomen Pelvis W Contrast  Result Date: 04/02/2018 CLINICAL DATA:  Bloody stools in the evening.  No nausea vomiting EXAM: CT ABDOMEN AND PELVIS WITH CONTRAST TECHNIQUE: Multidetector CT imaging  of the abdomen and pelvis was performed using the standard protocol following bolus administration of intravenous contrast. CONTRAST:  172mL OMNIPAQUE IOHEXOL 300 MG/ML  SOLN COMPARISON:  09/02/2013 FINDINGS: Lower chest: No acute abnormality. Distal esophageal air-fluid level. Hepatobiliary: No focal liver abnormality is seen. No gallstones, gallbladder wall thickening, or biliary dilatation. Pancreas: Unremarkable. No pancreatic ductal dilatation or surrounding inflammatory changes. Spleen: Normal in size without focal abnormality. Adrenals/Urinary Tract: Adrenal glands are  unremarkable. Kidneys are normal, without renal calculi, focal lesion, or hydronephrosis. Bladder is unremarkable. Stomach/Bowel: Stomach is within normal limits. Appendix appears normal. No significant bowel wall thickening. Mild perirectal inflammatory changes. Vascular/Lymphatic: No significant vascular findings are present. No enlarged abdominal or pelvic lymph nodes. Reproductive: 5 cm right uterine mass most consistent with a fibroid. Other: No abdominal wall hernia or abnormality. No abdominopelvic ascites. Musculoskeletal: No acute osseous abnormality. No aggressive osseous lesion. Degenerative disc disease with disc height loss at L5-S1. Facet arthropathy throughout the lumbar spine. Severe bilateral foraminal stenosis at L5-S1. IMPRESSION: 1. Mild perirectal inflammatory changes without bowel wall thickening concerning for mild proctitis. 2. Lumbar spine spondylosis. Electronically Signed   By: Kathreen Devoid   On: 04/02/2018 13:41    Principal Problem:   GI bleeding Active Problems:   Chest pain   Hypertension   Diabetes mellitus type 2 with complications, uncontrolled ([place])   Tobacco use   OSA (obstructive sleep apnea)   Schizoaffective disorder, bipolar type ([place])   Acute renal failure (ARF) ([facility])   Hypokalemia   Polysubstance abuse ([place])   Diabetic foot ulcer ([place])     LOS: 0 days   Tye Savoy ,NP 04/03/2018, 2:27 PM

## 2018-04-03 NOTE — Progress Notes (Signed)
[facility]  PROGRESS NOTE  Diamond Rice ONG:[id number] DOB: 01-05-1971 DOA: 04/02/2018 PCP: [name], MD   Brief HPI:   47 yr old female with  history of anxiety,  COPD diabetes mellitus came to ED chest pain and hematochezia.  She was found to have guaiac positive stool.  Hemoglobin dropped from 11.7-10.4.  GI was consulted.    S   Patient seen and examined, denies any bloody bowel movement today.  Her hemoglobin dropped to 8.8   Assessment/Plan:     1. Rectal bleeding-resolved, CT showed findings of proctitis.  Hemoglobin dropped to 8.8.  Plan for colonoscopy in a.m. 2. Chest pain-resolved, troponin x2 is negative.  Blood pressure is stable 3. Diabetic foot ulcer-wound care following.  MRI of the foot showsSoft tissue wound along the dorsal aspect of the forefoot overlying fourth metatarsal head with surrounding mild cellulitis.  Subcortical marrow edema in the first metatarsal head and base of the first proximal phalanx with surrounding soft tissue edema but without definite bone destruction.  Continue ceftriaxone and Flagyl. 4. Hypokalemia-replace potassium and check BMP in a.m. 5. Hypertension-blood pressure is stable, ACE inhibitor on hold due to worsening renal function. 6. Acute kidney injury-likely in setting of GI bleed, renal function is improved to 1.30. 7. Schizoaffective disorder-continue Abilify    DVT prophylaxis: SCD  Code Status: Full code  Family Communication: No family at bedside  Disposition Plan: likely home when medically ready for discharge   Consultants:  GI  Procedures:  None   Antibiotics:   Anti-infectives (From admission, onward)   Start     Dose/Rate Route Frequency Ordered Stop   04/02/18 1400  metroNIDAZOLE (FLAGYL) tablet 500 mg     500 mg Oral Every 8 hours 04/02/18 1109     04/02/18 1300  cefTRIAXone (ROCEPHIN) 2 g in sodium chloride 0.9 % 100 mL IVPB     2 g 200 mL/hr over 30 Minutes Intravenous Every 24  hours 04/02/18 1109         Objective   Vitals:   04/03/18 0503 04/03/18 0632 04/03/18 1344 04/03/18 1439  BP: (!) 147/95  124/83 119/72  Pulse: 71  72 75  Resp: 18   18  Temp: 98 F (36.7 C)   98.1 F (36.7 C)  TempSrc: Oral   Oral  SpO2: 100%   100%  Weight:  93.9 kg (207 lb)    Height:  5\' 1"  (1.549 m)      Intake/Output Summary (Last 24 hours) at 04/03/2018 1853 Last data filed at 04/03/2018 1756 Gross per 24 hour  Intake 2553.83 ml  Output 200 ml  Net 2353.83 ml   Filed Weights   04/03/18 4401  Weight: 93.9 kg (207 lb)     Physical Examination:    General: Appears in no acute distress  Cardiovascular: S1-S2, regular  Respiratory: Clear bilaterally  Abdomen: Soft, nontender, no organomegaly  Extremities: No edema of the lower extremities  Neurologic: Alert , oriented x3     Data Reviewed: I have personally reviewed following labs and imaging studies  CBG: Recent Labs  Lab 04/02/18 1811 04/02/18 2142 04/03/18 0828 04/03/18 1212 04/03/18 1625  GLUCAP 166* 143* 107* 160* 156*    CBC: Recent Labs  Lab 04/02/18 0158 04/02/18 0612 04/02/18 1624 04/02/18 2239 04/03/18 0508 04/03/18 1157 04/03/18 1625  WBC 11.1*  --  11.5*  --  11.1*  --   --   HGB 11.7* 10.4* 9.6* 9.2* 9.5* 8.8* 8.4*  HCT 37.8 34.3* 30.8*  --  29.8*  --   --   MCV 75.1*  --  73.3*  --  74.3*  --   --   PLT 353  --  310  --  292  --   --     Basic Metabolic Panel: Recent Labs  Lab 04/02/18 0158 04/03/18 0508  NA 136 136  K 2.9* 3.4*  CL 99 100  CO2 26 23  GLUCOSE 207* 88  BUN 17 16  CREATININE 1.63* 1.30*  CALCIUM 9.1 8.6*    Recent Results (from the past 240 hour(s))  Blood Cultures x 2 sites     Status: None (Preliminary result)   Collection Time: 04/02/18  4:15 PM  Result Value Ref Range Status   Specimen Description BLOOD LEFT ANTECUBITAL  Final   Special Requests   Final    BOTTLES DRAWN AEROBIC ONLY Blood Culture adequate volume   Culture   Final     NO GROWTH < 24 HOURS Performed at [facility], [place] [address] [place], [place] [address]    Report Status PENDING  Incomplete  Blood Cultures x 2 sites     Status: None (Preliminary result)   Collection Time: 04/02/18  4:26 PM  Result Value Ref Range Status   Specimen Description BLOOD LEFT HAND  Final   Special Requests   Final    BOTTLES DRAWN AEROBIC ONLY Blood Culture adequate volume   Culture   Final    NO GROWTH < 24 HOURS Performed at [facility], [place] [address] [place], [place] [address]    Report Status PENDING  Incomplete     Liver Function Tests: No results for input(s): AST, ALT, ALKPHOS, BILITOT, PROT, ALBUMIN in the last 168 hours. No results for input(s): LIPASE, AMYLASE in the last 168 hours. No results for input(s): AMMONIA in the last 168 hours.  Cardiac Enzymes: Recent Labs  Lab 04/02/18 1014  TROPONINI <0.03   BNP (last 3 results) No results for input(s): BNP in the last 8760 hours.  ProBNP (last 3 results) No results for input(s): PROBNP in the last 8760 hours.    Studies: Dg Chest 2 View  Result Date: 04/02/2018 CLINICAL DATA:  Acute onset of left upper chest pain and mild shortness of breath. EXAM: CHEST - 2 VIEW COMPARISON:  Chest radiograph performed 09/08/2017 FINDINGS: The lungs are well-aerated. Minimal left basilar atelectasis is noted. There is no evidence of pleural effusion or pneumothorax. The heart is borderline normal in size. No acute osseous abnormalities are seen. IMPRESSION: Minimal left basilar atelectasis noted; lungs otherwise clear. Electronically Signed   By: Garald Balding M.D.   On: 04/02/2018 02:23   Ct Abdomen Pelvis W Contrast  Result Date: 04/02/2018 CLINICAL DATA:  Bloody stools in the evening.  No nausea vomiting EXAM: CT ABDOMEN AND PELVIS WITH CONTRAST TECHNIQUE: Multidetector CT imaging of the abdomen and pelvis was performed using the standard protocol following bolus administration of intravenous  contrast. CONTRAST:  137mL OMNIPAQUE IOHEXOL 300 MG/ML  SOLN COMPARISON:  09/02/2013 FINDINGS: Lower chest: No acute abnormality. Distal esophageal air-fluid level. Hepatobiliary: No focal liver abnormality is seen. No gallstones, gallbladder wall thickening, or biliary dilatation. Pancreas: Unremarkable. No pancreatic ductal dilatation or surrounding inflammatory changes. Spleen: Normal in size without focal abnormality. Adrenals/Urinary Tract: Adrenal glands are unremarkable. Kidneys are normal, without renal calculi, focal lesion, or hydronephrosis. Bladder is unremarkable. Stomach/Bowel: Stomach is within normal limits. Appendix appears normal. No  significant bowel wall thickening. Mild perirectal inflammatory changes. Vascular/Lymphatic: No significant vascular findings are present. No enlarged abdominal or pelvic lymph nodes. Reproductive: 5 cm right uterine mass most consistent with a fibroid. Other: No abdominal wall hernia or abnormality. No abdominopelvic ascites. Musculoskeletal: No acute osseous abnormality. No aggressive osseous lesion. Degenerative disc disease with disc height loss at L5-S1. Facet arthropathy throughout the lumbar spine. Severe bilateral foraminal stenosis at L5-S1. IMPRESSION: 1. Mild perirectal inflammatory changes without bowel wall thickening concerning for mild proctitis. 2. Lumbar spine spondylosis. Electronically Signed   By: Kathreen Devoid   On: 04/02/2018 13:41   Mr Foot Left W Wo Contrast  Result Date: 04/02/2018 CLINICAL DATA:  Nonhealing ulcer along the top of the foot. EXAM: MRI OF THE LEFT FOREFOOT WITHOUT AND WITH CONTRAST TECHNIQUE: Multiplanar, multisequence MR imaging of the left forefoot was performed both before and after administration of intravenous contrast. CONTRAST:  23mL MULTIHANCE GADOBENATE DIMEGLUMINE 529 MG/ML IV SOLN COMPARISON:  None. FINDINGS: Bones/Joint/Cartilage Soft tissue wound along the dorsal aspect of the forefoot centered over the fourth  metatarsal head. Subcortical marrow edema in the first metatarsal head and base of the first proximal phalanx with surrounding soft tissue edema. No definite bone destruction or periosteal reaction. No acute fracture or dislocation. Normal alignment. No joint effusion. Ligaments Ligaments are suboptimally evaluated by CT. Muscles and Tendons No muscle atrophy. Visualized flexor, extensor and peroneal tendons are intact. Soft tissue No fluid collection or hematoma.  No soft tissue mass. IMPRESSION: 1. Soft tissue wound along the dorsal aspect of the forefoot overlying the fourth metatarsal head with surrounding mild cellulitis. Subcortical marrow edema in the first metatarsal head and base of the first proximal phalanx with surrounding soft tissue edema, but without definite bone destruction. Differential considerations include reactive edema secondary to adjacent inflammation versus early osteomyelitis. Electronically Signed   By: Kathreen Devoid   On: 04/02/2018 15:07    Scheduled Meds: . ARIPiprazole  15 mg Oral QHS  . cloNIDine  0.3 mg Oral TID  . collagenase   Topical Daily  . feeding supplement  1 Container Oral TID BM  . hydrALAZINE  50 mg Oral Q8H  . insulin aspart  0-15 Units Subcutaneous TID WC  . insulin aspart  0-5 Units Subcutaneous QHS  . insulin detemir  15 Units Subcutaneous BID  . metroNIDAZOLE  500 mg Oral Q8H  . sodium chloride flush  3 mL Intravenous Q12H      Time spent: 20 min  Oswald Hillock   [facility] Pager [phone]. If 7PM-7AM, please contact night-coverage at [email or web address], Office  [phone]  password [email or web address]  04/03/2018, 6:53 PM  LOS: 0 days

## 2018-04-03 NOTE — Care Management Note (Signed)
Case Management Note  Patient Details  Name: Kameah Rawl MRN: [id number] Date of Birth: October 14, 1970  Subjective/Objective:    Present with rectal bleeding .Hx of DM; CVA; OSA; HTN; HLD; and COPD presenting with rectal bleeding            Action/Plan: PT evaluation pending....NCM following for transitional care needs.  Expected Discharge Date:                  Expected Discharge Plan:  [place]  In-House Referral:     Discharge planning Services  CM Consult  Post Acute Care Choice:    Choice offered to:  Patient  DME Arranged:    DME Agency:     HH Arranged:    [place] Agency:     Status of Service:  In process, will continue to follow  If discussed at Long Length of Stay Meetings, dates discussed:    Additional Comments:  Sharin Mons, RN 04/03/2018, 4:31 PM

## 2018-04-03 NOTE — Progress Notes (Signed)
VASCULAR LAB PRELIMINARY  ARTERIAL  ABI completed:    RIGHT    LEFT    PRESSURE WAVEFORM  PRESSURE WAVEFORM  BRACHIAL 104 Triphasic BRACHIAL 104 Triphasic  DP 83 Triphasic DP 79 Triphasic  PT 117 Triphasic PT 97 Triphasic  GREAT TOE 97 NA GREAT TOE 36 NA    RIGHT LEFT  ABI / TBI 1.13 / 0.93   0.93 / 0.35   Right ABI indicates  Normal arterial flow at rest with a normal TBI. Left ABI indicates normal arterial flow at rest with an abnormal TBI,  Charonda Hefter, RVS 04/03/2018, 11:32 AM

## 2018-04-03 NOTE — Progress Notes (Signed)
Received pt to Rm 35. Pt VSS  With no acute distress upon arrival. Pt with Diabetic stage 2 ulcer to Left foot otherwise skin intact. Skin assessed with Elisa RN.

## 2018-04-04 ENCOUNTER — Encounter (HOSPITAL_COMMUNITY): Payer: Self-pay | Admitting: Certified Registered Nurse Anesthetist

## 2018-04-04 ENCOUNTER — Inpatient Hospital Stay (HOSPITAL_COMMUNITY): Payer: Medicare Other | Admitting: Anesthesiology

## 2018-04-04 ENCOUNTER — Inpatient Hospital Stay (HOSPITAL_COMMUNITY): Payer: Medicare Other

## 2018-04-04 ENCOUNTER — Encounter (HOSPITAL_COMMUNITY)
Admission: EM | Disposition: A | Discharge status: Skilled Nursing Facility | Payer: Self-pay | Source: Home / Self Care | Attending: Family Medicine

## 2018-04-04 ENCOUNTER — Ambulatory Visit: Payer: Medicare Other | Admitting: Family Medicine

## 2018-04-04 DIAGNOSIS — K626 Ulcer of anus and rectum: Secondary | ICD-10-CM

## 2018-04-04 DIAGNOSIS — D122 Benign neoplasm of ascending colon: Secondary | ICD-10-CM

## 2018-04-04 DIAGNOSIS — E08621 Diabetes mellitus due to underlying condition with foot ulcer: Secondary | ICD-10-CM

## 2018-04-04 DIAGNOSIS — L97422 Non-pressure chronic ulcer of left heel and midfoot with fat layer exposed: Secondary | ICD-10-CM

## 2018-04-04 DIAGNOSIS — D124 Benign neoplasm of descending colon: Secondary | ICD-10-CM

## 2018-04-04 DIAGNOSIS — I739 Peripheral vascular disease, unspecified: Secondary | ICD-10-CM

## 2018-04-04 DIAGNOSIS — Z720 Tobacco use: Secondary | ICD-10-CM

## 2018-04-04 HISTORY — PX: BIOPSY: SHX5522

## 2018-04-04 HISTORY — PX: POLYPECTOMY: SHX5525

## 2018-04-04 HISTORY — PX: COLONOSCOPY: SHX5424

## 2018-04-04 LAB — HEMOGLOBIN
HEMOGLOBIN: 8.5 g/dL — AB (ref 12.0–15.0)
HEMOGLOBIN: 8.1 g/dL — AB (ref 12.0–15.0)
HEMOGLOBIN: 9 g/dL — AB (ref 12.0–15.0)
Hemoglobin: 8.9 g/dL — ABNORMAL LOW (ref 12.0–15.0)
Hemoglobin: 8.8 g/dL — ABNORMAL LOW (ref 12.0–15.0)

## 2018-04-04 LAB — GLUCOSE, CAPILLARY
GLUCOSE-CAPILLARY: 98 mg/dL (ref 70–99)
GLUCOSE-CAPILLARY: 95 mg/dL (ref 70–99)
Glucose-Capillary: 105 mg/dL — ABNORMAL HIGH (ref 70–99)
Glucose-Capillary: 132 mg/dL — ABNORMAL HIGH (ref 70–99)
Glucose-Capillary: 246 mg/dL — ABNORMAL HIGH (ref 70–99)

## 2018-04-04 SURGERY — COLONOSCOPY
Anesthesia: Monitor Anesthesia Care

## 2018-04-04 MED ORDER — PROPOFOL 500 MG/50ML IV EMUL
INTRAVENOUS | Status: DC | PRN
  Administered 2018-04-04: 75 ug/kg/min via INTRAVENOUS

## 2018-04-04 MED ORDER — PENTOXIFYLLINE ER 400 MG PO TBCR
400.0000 mg | EXTENDED_RELEASE_TABLET | Freq: Three times a day (TID) | ORAL | Status: DC
  Administered 2018-04-05 – 2018-04-12 (×23): 400 mg via ORAL
  Filled 2018-04-04 (×24): qty 1

## 2018-04-04 MED ORDER — LACTATED RINGERS IV SOLN
INTRAVENOUS | Status: AC | PRN
  Administered 2018-04-04: 1000 mL via INTRAVENOUS

## 2018-04-04 MED ORDER — HYDROCORTISONE ACETATE 25 MG RE SUPP
25.0000 mg | Freq: Every day | RECTAL | Status: DC
  Administered 2018-04-05 – 2018-04-12 (×6): 25 mg via RECTAL
  Filled 2018-04-04 (×7): qty 1

## 2018-04-04 MED ORDER — PROPOFOL 10 MG/ML IV BOLUS
INTRAVENOUS | Status: DC | PRN
  Administered 2018-04-04 (×3): 20 mg via INTRAVENOUS

## 2018-04-04 MED ORDER — ADULT MULTIVITAMIN W/MINERALS CH
1.0000 | ORAL_TABLET | Freq: Every day | ORAL | Status: DC
  Administered 2018-04-04 – 2018-04-12 (×9): 1 via ORAL
  Filled 2018-04-04 (×9): qty 1

## 2018-04-04 MED ORDER — POLYETHYLENE GLYCOL 3350 17 G PO PACK
17.0000 g | PACK | Freq: Two times a day (BID) | ORAL | Status: DC
  Administered 2018-04-06 – 2018-04-09 (×5): 17 g via ORAL
  Filled 2018-04-04 (×13): qty 1

## 2018-04-04 MED ORDER — GLUCERNA SHAKE PO LIQD
237.0000 mL | Freq: Three times a day (TID) | ORAL | Status: DC
  Administered 2018-04-04 – 2018-04-12 (×16): 237 mL via ORAL
  Filled 2018-04-04: qty 237

## 2018-04-04 NOTE — Anesthesia Procedure Notes (Signed)
Procedure Name: MAC Date/Time: 04/04/2018 11:49 AM Performed by: Carney Living, CRNA Pre-anesthesia Checklist: Patient identified, Emergency Drugs available, Suction available, Patient being monitored and Timeout performed Patient Re-evaluated:Patient Re-evaluated prior to induction Oxygen Delivery Method: Simple face mask

## 2018-04-04 NOTE — Transfer of Care (Signed)
Immediate Anesthesia Transfer of Care Note  Patient: Diamond Rice  Procedure(s) Performed: COLONOSCOPY (N/A ) POLYPECTOMY BIOPSY  Patient Location: Endoscopy Unit  Anesthesia Type:MAC  Level of Consciousness: awake, alert , oriented and patient cooperative  Airway & Oxygen Therapy: Patient Spontanous Breathing and Patient connected to nasal cannula oxygen  Post-op Assessment: Report given to RN, Post -op Vital signs reviewed and stable and Patient moving all extremities X 4  Post vital signs: Reviewed and stable  Last Vitals:  Vitals Value Taken Time  BP 189/90 04/04/2018 12:30 PM  Temp 36.7 C 04/04/2018 12:28 PM  Pulse 98 04/04/2018 12:32 PM  Resp 15 04/04/2018 12:32 PM  SpO2 100 % 04/04/2018 12:32 PM  Vitals shown include unvalidated device data.  Last Pain:  Vitals:   04/04/18 1228  TempSrc: Oral  PainSc: 0-No pain      Patients Stated Pain Goal: 2 (93/11/21 6244)  Complications: No apparent anesthesia complications

## 2018-04-04 NOTE — Progress Notes (Addendum)
[facility]  PROGRESS NOTE  Diamond Rice WEX:[id number] DOB: 1971/04/14 DOA: 04/02/2018 PCP: [name], MD   Brief HPI:   47 yr old female with  history of anxiety,  COPD diabetes mellitus came to ED chest pain and hematochezia.  She was found to have guaiac positive stool.  Hemoglobin dropped from 11.7-10.4.  GI was consulted.    S   Patient seen and examined, colonoscopy planned for today.  Hemoglobin dropped to 8.1.   Assessment/Plan:     1. Rectal bleeding-resolved, CT showed findings of proctitis.  Hemoglobin dropped to 8.1.  Patient underwent colonoscopy today, showed polyp status post polypectomy and rectal ulcers.  Will start MiraLAX twice a day indefinitely and hydrocortisone suppository once daily for 10 days as per GI recommendation. 2. Chest pain-resolved, troponin x2 is negative.  Blood pressure is stable 3. Diabetic foot ulcer-wound care following.  MRI of the foot showsSoft tissue wound along the dorsal aspect of the forefoot overlying fourth metatarsal head with surrounding mild cellulitis.  Subcortical marrow edema in the first metatarsal head and base of the first proximal phalanx with surrounding soft tissue edema but without definite bone destruction.  Continue ceftriaxone and Flagyl.  Ordered ABI, also consulted orthopedics Dr. Sharol Given for further recommendations. 4. Hypokalemia-replace potassium and check BMP in a.m. 5. Hypertension-blood pressure is stable, ACE inhibitor on hold due to worsening renal function. 6. Acute kidney injury-likely in setting of GI bleed, renal function is improved to 1.30. 7. Schizoaffective disorder-continue Abilify    DVT prophylaxis: SCD  Code Status: Full code  Family Communication: No family at bedside  Disposition Plan: likely home when medically ready for discharge   Consultants:  GI  Procedures:  None   Antibiotics:   Anti-infectives (From admission, onward)   Start     Dose/Rate Route Frequency  Ordered Stop   04/02/18 1400  metroNIDAZOLE (FLAGYL) tablet 500 mg     500 mg Oral Every 8 hours 04/02/18 1109     04/02/18 1300  cefTRIAXone (ROCEPHIN) 2 g in sodium chloride 0.9 % 100 mL IVPB     2 g 200 mL/hr over 30 Minutes Intravenous Every 24 hours 04/02/18 1109         Objective   Vitals:   04/04/18 1139 04/04/18 1228 04/04/18 1240 04/04/18 1324  BP: (!) 187/99 (!) 189/90 (!) 190/97 (!) 160/83  Pulse:  100 96 100  Resp:  (!) 24 (!) 24 20  Temp: 98.5 F (36.9 C) 98.1 F (36.7 C)  98.6 F (37 C)  TempSrc: Oral Oral  Oral  SpO2: 100% 100% 99% 100%  Weight: 97.4 kg     Height: 5\' 1"  (1.549 m)       Intake/Output Summary (Last 24 hours) at 04/04/2018 1431 Last data filed at 04/04/2018 1233 Gross per 24 hour  Intake 1622.85 ml  Output 200 ml  Net 1422.85 ml   Filed Weights   04/03/18 0632 04/04/18 0544 04/04/18 1139  Weight: 93.9 kg 97.4 kg 97.4 kg     Physical Examination:    Lungs: Normal respiratory effort, bilateral clear to auscultation, no crackles or wheezes.  Heart: Regular rate and rhythm, S1 and S2 normal, no murmurs, rubs auscultated Abdomen: BS normoactive,soft,nondistended,non-tender to palpation,no organomegaly Extremities: Left midfoot in dressing Neuro : Alert and oriented to time, place and person, No focal deficits  Skin: No rashes seen on exam     Data Reviewed: I have personally reviewed following labs and imaging studies  CBG: Recent Labs  Lab 04/03/18 1625 04/03/18 2210 04/04/18 0806 04/04/18 1146 04/04/18 1321  GLUCAP 156* 75 98 95 105*    CBC: Recent Labs  Lab 04/02/18 0158 04/02/18 0612 04/02/18 1624  04/03/18 0508 04/03/18 1157 04/03/18 1625 04/03/18 2305 04/04/18 0451 04/04/18 1310  WBC 11.1*  --  11.5*  --  11.1*  --   --   --   --   --   HGB 11.7* 10.4* 9.6*   < > 9.5* 8.8* 8.4* 8.5* 8.1* 8.9*  HCT 37.8 34.3* 30.8*  --  29.8*  --   --   --   --   --   MCV 75.1*  --  73.3*  --  74.3*  --   --   --   --   --    PLT 353  --  310  --  292  --   --   --   --   --    < > = values in this interval not displayed.    Basic Metabolic Panel: Recent Labs  Lab 04/02/18 0158 04/03/18 0508  NA 136 136  K 2.9* 3.4*  CL 99 100  CO2 26 23  GLUCOSE 207* 88  BUN 17 16  CREATININE 1.63* 1.30*  CALCIUM 9.1 8.6*    Recent Results (from the past 240 hour(s))  Blood Cultures x 2 sites     Status: None (Preliminary result)   Collection Time: 04/02/18  4:15 PM  Result Value Ref Range Status   Specimen Description BLOOD LEFT ANTECUBITAL  Final   Special Requests   Final    BOTTLES DRAWN AEROBIC ONLY Blood Culture adequate volume   Culture   Final    NO GROWTH 2 DAYS Performed at [facility], [place] [address], [place], [place] [address]    Report Status PENDING  Incomplete  Blood Cultures x 2 sites     Status: None (Preliminary result)   Collection Time: 04/02/18  4:26 PM  Result Value Ref Range Status   Specimen Description BLOOD LEFT HAND  Final   Special Requests   Final    BOTTLES DRAWN AEROBIC ONLY Blood Culture adequate volume   Culture   Final    NO GROWTH 2 DAYS Performed at [facility], [place] [address] [place] , [place] [address]    Report Status PENDING  Incomplete     Liver Function Tests: No results for input(s): AST, ALT, ALKPHOS, BILITOT, PROT, ALBUMIN in the last 168 hours. No results for input(s): LIPASE, AMYLASE in the last 168 hours. No results for input(s): AMMONIA in the last 168 hours.  Cardiac Enzymes: Recent Labs  Lab 04/02/18 1014  TROPONINI <0.03   BNP (last 3 results) No results for input(s): BNP in the last 8760 hours.  ProBNP (last 3 results) No results for input(s): PROBNP in the last 8760 hours.    Studies: Mr Foot Left W Wo Contrast  Result Date: 04/02/2018 CLINICAL DATA:  Nonhealing ulcer along the top of the foot. EXAM: MRI OF THE LEFT FOREFOOT WITHOUT AND WITH CONTRAST TECHNIQUE: Multiplanar, multisequence MR imaging of the left forefoot  was performed both before and after administration of intravenous contrast. CONTRAST:  75mL MULTIHANCE GADOBENATE DIMEGLUMINE 529 MG/ML IV SOLN COMPARISON:  None. FINDINGS: Bones/Joint/Cartilage Soft tissue wound along the dorsal aspect of the forefoot centered over the fourth metatarsal head. Subcortical marrow edema in the first metatarsal head and base of the first proximal phalanx with  surrounding soft tissue edema. No definite bone destruction or periosteal reaction. No acute fracture or dislocation. Normal alignment. No joint effusion. Ligaments Ligaments are suboptimally evaluated by CT. Muscles and Tendons No muscle atrophy. Visualized flexor, extensor and peroneal tendons are intact. Soft tissue No fluid collection or hematoma.  No soft tissue mass. IMPRESSION: 1. Soft tissue wound along the dorsal aspect of the forefoot overlying the fourth metatarsal head with surrounding mild cellulitis. Subcortical marrow edema in the first metatarsal head and base of the first proximal phalanx with surrounding soft tissue edema, but without definite bone destruction. Differential considerations include reactive edema secondary to adjacent inflammation versus early osteomyelitis. Electronically Signed   By: Kathreen Devoid   On: 04/02/2018 15:07    Scheduled Meds: . ARIPiprazole  15 mg Oral QHS  . cloNIDine  0.3 mg Oral TID  . collagenase   Topical Daily  . feeding supplement  1 Container Oral TID BM  . hydrALAZINE  50 mg Oral Q8H  . insulin aspart  0-15 Units Subcutaneous TID WC  . insulin aspart  0-5 Units Subcutaneous QHS  . insulin detemir  15 Units Subcutaneous BID  . metroNIDAZOLE  500 mg Oral Q8H  . sodium chloride flush  3 mL Intravenous Q12H      Time spent: 20 min  Oswald Hillock   [facility] Pager [phone]. If 7PM-7AM, please contact night-coverage at [email or web address], Office  [phone]  password [email or web address]  04/04/2018, 2:31 PM  LOS: 1 day

## 2018-04-04 NOTE — Op Note (Signed)
[facility] [name] Procedure Date : 04/04/2018 MRN: [id number] Attending MD: [name] , MD Date of Birth: July 14, 1971 CSN: [id number] Age: 47 Admit Type: Inpatient Procedure:                Colonoscopy Indications:              Rectal bleeding with anemia. Providers:                [name], MD, Kingsley Plan, RN, Cherylynn Ridges, Technician, Kerrie Pleasure, CRNA Referring MD:              Medicines:                Monitored Anesthesia Care Complications:            No immediate complications. Estimated Blood Loss:     Estimated blood loss was minimal. Procedure:                Pre-Anesthesia Assessment:                           - Prior to the procedure, a History and Physical                            was performed, and patient medications and                            allergies were reviewed. The patient's tolerance of                            previous anesthesia was also reviewed. The risks                            and benefits of the procedure and the sedation                            options and risks were discussed with the patient.                            All questions were answered, and informed consent                            was obtained. Prior Anticoagulants: The patient has                            taken no previous anticoagulant or antiplatelet                            agents. ASA Grade Assessment: II - A patient with                            mild systemic disease. After reviewing the risks  and benefits, the patient was deemed in                            satisfactory condition to undergo the procedure.                           After obtaining informed consent, the colonoscope                            was passed under direct vision. Throughout the                            procedure, the patient's blood pressure, pulse, and                             oxygen saturations were monitored continuously. The                            CF-HQ190L ([id number] Olympus adult colon was                            introduced through the anus and advanced to the 2                            cm into the ileum. The colonoscopy was performed                            without difficulty. The patient tolerated the                            procedure well. The quality of the bowel                            preparation was fair. Scope In: 76:54:65 AM Scope Out: 12:19:35 PM Scope Withdrawal Time: 0 hours 17 minutes 2 seconds  Total Procedure Duration: 0 hours 22 minutes 39 seconds  Findings:      A 6 mm polyp was found in the proximal ascending colon. The polyp was       sessile. The polyp was removed with a cold snare. Resection and       retrieval were complete. Estimated blood loss was minimal.      A 10 mm polyp was found in the descending colon. The polyp was       semi-pedunculated. The polyp was removed with a hot snare. Resection and       retrieval were complete. Estimated blood loss: none.      A 3-4, 10-12 mm ulcers were found in the distal rectum with clean base.       No bleeding was present. No stigmata of recent bleeding were seen. Clots       were noted in the rectum. Biopsies were taken with a cold forceps for       histology. Estimated blood loss was minimal.      The exam was otherwise without abnormality on direct and retroflexion       views. Impression:               -  Colonic polyps status post polypectomy.                           - Ulcers in the distal rectum (likely stercoral).                            Biopsied.                           - The examination was otherwise normal on direct                            and retroflexion views. Limited due to quality of                            preparation. Recommendation:           - Return patient to hospital ward for ongoing care.                           - Resume previous  diet.                           - Await pathology results.                           - Miralax 1 capful (17 grams) in 8 ounces of water                            PO BID indefinitely.                           - Repeat colonoscopy for surveillance based on                            pathology results.                           - Use hydrocortisone suppository 25 mg 1 per rectum                            once a day for 10 days. Procedure Code(s):        --- Professional ---                           [phone], Colonoscopy, flexible; with removal of                            tumor(s), polyp(s), or other lesion(s) by snare                            technique                           26333, 53, Colonoscopy, flexible; with biopsy,  single or multiple Diagnosis Code(s):        --- Professional ---                           D12.2, Benign neoplasm of ascending colon                           D12.4, Benign neoplasm of descending colon                           K62.6, Ulcer of anus and rectum                           K62.5, Hemorrhage of anus and rectum CPT copyright 2017 American [employer] Association. All rights reserved. The codes documented in this report are preliminary and upon coder review may  be revised to meet current compliance requirements. [name], MD 04/04/2018 12:32:53 PM This report has been signed electronically. Number of Addenda: 0

## 2018-04-04 NOTE — Evaluation (Signed)
Physical Therapy Evaluation Patient Details Name: Diamond Rice MRN: [id number] DOB: 03-29-1971 Today's Date: 04/04/2018   History of Present Illness  Diamond Rice is a 47 y.o. female with medical history significant of DM; CVA; OSA; HTN; HLD; and COPD presenting with rectal bleeding.   Clinical Impression  Patient presents with decreased independence with mobility due to pain and weakness.  Feel she will benefit from skilled PT in the acute setting to allow return home following SNF level rehab stay.    Follow Up Recommendations SNF;Supervision/Assistance - 24 hour    Equipment Recommendations  None recommended by PT    Recommendations for Other Services       Precautions / Restrictions Precautions Precautions: Fall Precaution Comments: L foot ulcer      Mobility  Bed Mobility Overal bed mobility: Needs Assistance Bed Mobility: Supine to Sit;Sit to Supine     Supine to sit: HOB elevated;Min guard Sit to supine: Supervision   General bed mobility comments: increased time for mobility and with great effort, painful L foot in any position and unable to get pain meds due to going for colonoscopy today so deferred transfers  Transfers                    Ambulation/Gait                Stairs            Wheelchair Mobility    Modified Rankin (Stroke Patients Only)       Balance Overall balance assessment: Needs assistance Sitting-balance support: Feet unsupported Sitting balance-Leahy Scale: Good Sitting balance - Comments: sitting EOB no UE support and feet unsupported, leaning to scoot and for MMT                                     Pertinent Vitals/Pain Pain Assessment: Faces Faces Pain Scale: Hurts whole lot Pain Location: L foot constant, but worse trying to stand Pain Descriptors / Indicators: Aching;Sore Pain Intervention(s): Monitored during session;Repositioned;Limited activity within patient's tolerance     Home Living Family/patient expects to be discharged to:: Private residence(Living with a friend, an older lady) Living Arrangements: Non-relatives/Friends Available Help at Discharge: Friend(s) Type of Home: Apartment Home Access: Level entry     Home Layout: One level Home Equipment: Walker - 4 wheels;Cane - quad Additional Comments: using chair in shower, reports vanity beside commode    Prior Function Level of Independence: Independent with assistive device(s)         Comments: struggling at baseline, sometimes did not make it to bathroom, has been using walker or cane since stroke, L foot pain since June when she had a fall     Hand Dominance   Dominant Hand: Right    Extremity/Trunk Assessment   Upper Extremity Assessment Upper Extremity Assessment: LUE deficits/detail LUE Deficits / Details: slightly weaker than R with shoulder elevation reports stroke affected L side, still some coordination deficits with touching fingers to tumb    Lower Extremity Assessment Lower Extremity Assessment: RLE deficits/detail;LLE deficits/detail RLE Deficits / Details: AROM WFL, strength hip flexion 3/5, knee extension 4-/5, ankle DR 3+/5 LLE Deficits / Details: AROM WFL, strength hip flexion 3-/5, knee extension 3+/5, ankle DF 3-/5 LLE Sensation: decreased light touch       Communication   Communication: No difficulties  Cognition Arousal/Alertness: Awake/alert Behavior During Therapy: WFL for tasks  assessed/performed Overall Cognitive Status: Within Functional Limits for tasks assessed                                        General Comments      Exercises     Assessment/Plan    PT Assessment Patient needs continued PT services  PT Problem List Decreased strength;Decreased mobility;Pain;Decreased activity tolerance;Decreased knowledge of precautions       PT Treatment Interventions DME instruction;Therapeutic activities;Therapeutic  exercise;Patient/family education;Gait training;Balance training;Functional mobility training    PT Goals (Current goals can be found in the Care Plan section)  Acute Rehab PT Goals Patient Stated Goal: feels she will need rehab to be able to walk PT Goal Formulation: With patient Time For Goal Achievement: 04/18/18 Potential to Achieve Goals: Good    Frequency Min 3X/week   Barriers to discharge        Co-evaluation               AM-PAC PT "6 Clicks" Daily Activity  Outcome Measure Difficulty turning over in bed (including adjusting bedclothes, sheets and blankets)?: A Little Difficulty moving from lying on back to sitting on the side of the bed? : A Little Difficulty sitting down on and standing up from a chair with arms (e.g., wheelchair, bedside commode, etc,.)?: Unable Help needed moving to and from a bed to chair (including a wheelchair)?: Total Help needed walking in hospital room?: Total Help needed climbing 3-5 steps with a railing? : Total 6 Click Score: 10    End of Session   Activity Tolerance: Patient limited by pain Patient left: in bed;with call bell/phone within reach   PT Visit Diagnosis: Pain;Other abnormalities of gait and mobility (R26.89);History of falling (Z91.81) Pain - Right/Left: Left Pain - part of body: Ankle and joints of foot    Time: 1007-1020 PT Time Calculation (min) (ACUTE ONLY): 13 min   Charges:   PT Evaluation $PT Eval Moderate Complexity: [place], [place] [phone] 04/04/2018   Reginia Naas 04/04/2018, 10:31 AM

## 2018-04-04 NOTE — Interval H&P Note (Signed)
History and Physical Interval Note:  04/04/2018 11:25 AM  Diamond Rice  has presented today for surgery, with the diagnosis of rectal bleeding  The various methods of treatment have been discussed with the patient and family. After consideration of risks, benefits and other options for treatment, the patient has consented to  Procedure(s): COLONOSCOPY (N/A) as a surgical intervention .  The patient's history has been reviewed, patient examined, no change in status, stable for surgery.  I have reviewed the patient's chart and labs.  Questions were answered to the patient's satisfaction.     [name]

## 2018-04-04 NOTE — Progress Notes (Signed)
Initial Nutrition Assessment  DOCUMENTATION CODES:   Obesity unspecified  INTERVENTION:  Glucerna Shake po TID, each supplement provides 220 kcal and 10 grams of protein  D/C Boost Breeze  NUTRITION DIAGNOSIS:   Increased nutrient needs related to wound healing as evidenced by estimated needs.  GOAL:   Patient will meet greater than or equal to 90% of their needs  MONITOR:   PO intake, Supplement acceptance, Weight trends  REASON FOR ASSESSMENT:   Consult Wound healing  ASSESSMENT:   Patient with PMH DM, CVA, OSA, HTN, HLD, COPD presented with rectal bleeding. She had been constipated all week and took milk of magnesia on 8/5. She started pooping out blood 8/6. Also exhibits AKI and has a diabetic foot ulcer concerning for osteomyelitis.  Attempted to see patient x2. Hemoglobin dropped to 8.1 yesterday and patient underwent colonoscopy today, this showed rectal ulcers and a polyp s/p polypectomy.  No documented PO at this point in time, patient's diet just got advanced to carb modified at lunch today.  Medications reviewed and include:  Insulin, Miralax, NS at 112mL/hr  Labs reviewed:  CBGs 105, 95, 98 K+ 3.4   Intake/Output Summary (Last 24 hours) at 04/04/2018 1507 Last data filed at 04/04/2018 1233 Gross per 24 hour  Intake 1622.85 ml  Output 200 ml  Net 1422.85 ml  3.3L Fluid Positive  NUTRITION - FOCUSED PHYSICAL EXAM:    Most Recent Value  Orbital Region  Unable to assess  Upper Arm Region  Unable to assess  Thoracic and Lumbar Region  Unable to assess  Buccal Region  Unable to assess  Temple Region  Unable to assess  Clavicle Bone Region  Unable to assess  Clavicle and Acromion Bone Region  Unable to assess  Scapular Bone Region  Unable to assess  Dorsal Hand  Unable to assess  Patellar Region  Unable to assess  Anterior Thigh Region  Unable to assess  Posterior Calf Region  Unable to assess  Edema (RD Assessment)  Unable to assess  Hair  Unable  to assess  Eyes  Unable to assess  Mouth  Unable to assess  Skin  Unable to assess  Nails  Unable to assess       Diet Order:   Diet Order            Diet Carb Modified Fluid consistency: Thin; Room service appropriate? Yes  Diet effective now              EDUCATION NEEDS:   Not appropriate for education at this time  Skin:  Skin Assessment: Skin Integrity Issues: Skin Integrity Issues:: Diabetic Ulcer Diabetic Ulcer: To L foot  Last BM:  04/04/2018  Height:   Ht Readings from Last 1 Encounters:  04/04/18 5\' 1"  (1.549 m)    Weight:   Wt Readings from Last 1 Encounters:  04/04/18 97.4 kg    Ideal Body Weight:  47.72 kg  BMI:  Body mass index is 40.57 kg/m.  Estimated Nutritional Needs:   Kcal:  1800-2000 calories  Protein:  115-130 grams  Fluid:  1.8-2.0L   Satira Anis. Isacc Turney, MS, RD LDN Inpatient Clinical Dietitian Pager [phone]

## 2018-04-04 NOTE — Consult Note (Signed)
ORTHOPAEDIC CONSULTATION  REQUESTING PHYSICIAN: Oswald Hillock, MD  Chief Complaint: Painful ulcer dorsal lateral aspect left foot.  HPI: Diamond Rice is a 47 y.o. female who presents with a chronic painful ulcer dorsal lateral aspect left foot patient denies any trauma.  Patient has uncontrolled type 2 diabetes with protein caloric malnutrition.  Past Medical History:  Diagnosis Date  . Anginal pain ([place])   . Anxiety   . Chronic lower back pain 04/17/2012   "just got over some; catched when I walked"  . COPD (chronic obstructive pulmonary disease) ([place])   . Depression   . Headache(784.0) 04/17/2012   "~ qod; lately waking up in am w/one"  . Heart attack ([place])   . High cholesterol   . Hypertension   . Migraines 04/17/2012  . Obesity   . Shortness of breath 04/17/2012   "all the time"  . Sleep apnea    scheduled to get her CPAP machine this week  . Stroke ([place])   . Type II diabetes mellitus ([place]) 08/28/2002   Past Surgical History:  Procedure Laterality Date  . BIOPSY  04/04/2018   Procedure: BIOPSY;  Surgeon: [name], MD;  Location: [facility] ENDOSCOPY;  Service: Endoscopy;;  . CARDIAC CATHETERIZATION  ~ 2011  . COLONOSCOPY N/A 04/04/2018   Procedure: COLONOSCOPY;  Surgeon: [name], MD;  Location[facility] ENDOSCOPY;  Service: Endoscopy;  Laterality: N/A;  . POLYPECTOMY  04/04/2018   Procedure: POLYPECTOMY;  Surgeon: [name], MD;  Location: [facility] ENDOSCOPY;  Service: Endoscopy;;   Social History   Socioeconomic History  . Marital status: Single    Spouse name: Not on file  . Number of children: Not on file  . Years of education: Not on file  . Highest education level: Not on file  Occupational History  . Occupation: disabled  Social Needs  . Financial resource strain: Not on file  . Food insecurity:    Worry: Not on file    Inability: Not on file  . Transportation needs:    Medical: Not on file    Non-medical: Not on file  Tobacco Use  . Smoking status:  Current Every Day Smoker    Packs/day: 0.25    Years: 28.00    Pack years: 7.00    Types: Cigarettes  . Smokeless tobacco: Never Used  Substance and Sexual Activity  . Alcohol use: No    Alcohol/week: 0.0 standard drinks    Comment: rare  . Drug use: Yes    Types: Marijuana, "Crack" cocaine    Comment: marijuana last use days ago; last cocaine several months ago  . Sexual activity: Not Currently  Lifestyle  . Physical activity:    Days per week: Not on file    Minutes per session: Not on file  . Stress: Not on file  Relationships  . Social connections:    Talks on phone: Not on file    Gets together: Not on file    Attends religious service: Not on file    Active member of club or organization: Not on file    Attends meetings of clubs or organizations: Not on file    Relationship status: Not on file  Other Topics Concern  . Not on file  Social History Narrative   CNA at [employer].  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   Family History  Problem Relation Age of Onset  . Stroke Brother 33  . Stroke Brother 69  . Heart attack  Mother 24  . Stroke Father 51   - negative except otherwise stated in the family history section Allergies  Allergen Reactions  . Morphine And Related Hives   Prior to Admission medications   Medication Sig Start Date End Date Taking? Authorizing Provider  ARIPiprazole (ABILIFY) 15 MG tablet Take 1 tablet (15 mg total) by mouth at bedtime. 01/15/18  Yes Lord, Asa Saunas, NP  cephALEXin (KEFLEX) 500 MG capsule Take 1 capsule (500 mg total) by mouth 3 (three) times daily. 03/17/18  Yes Hayden Rasmussen, MD  cloNIDine (CATAPRES) 0.3 MG tablet Take 1 tablet (0.3 mg total) by mouth 3 (three) times daily. 01/15/18  Yes Patrecia Pour, NP  hydrALAZINE (APRESOLINE) 50 MG tablet Take 1 tablet (50 mg total) by mouth every 8 (eight) hours. 01/15/18  Yes Patrecia Pour, NP  Insulin Detemir (LEVEMIR) 100 UNIT/ML Pen Inject 15 Units into the skin 2 (two) times  daily.    Yes [provider]  lisinopril (PRINIVIL,ZESTRIL) 40 MG tablet Take 1 tablet (40 mg total) by mouth daily. For high blood pressure 01/15/18  Yes Lord, Asa Saunas, NP  oxyCODONE-acetaminophen (PERCOCET/ROXICET) 5-325 MG tablet Take 1 tablet by mouth every 6 (six) hours as needed for severe pain. 03/17/18  Yes Hayden Rasmussen, MD   No results found. - pertinent xrays, CT, MRI studies were reviewed and independently interpreted  Positive ROS: All other systems have been reviewed and were otherwise negative with the exception of those mentioned in the HPI and as above.  Physical Exam: General: Alert, no acute distress Psychiatric: Patient is competent for consent with normal mood and affect Lymphatic: No axillary or cervical lymphadenopathy Cardiovascular: No pedal edema Respiratory: No cyanosis, no use of accessory musculature GI: No organomegaly, abdomen is soft and non-tender    Images:  @ENCIMAGES @  Labs:  Lab Results  Component Value Date   HGBA1C 12.3 (H) 10/09/2015   HGBA1C 8.70 12/07/2014   HGBA1C 10.3 (H) 08/28/2014   ESRSEDRATE 55 (H) 04/02/2018   CRP 1.1 (H) 04/02/2018   REPTSTATUS PENDING 04/02/2018   CULT  04/02/2018    NO GROWTH 2 DAYS Performed at [facility], [place] [address], [place], [place] [address]     Lab Results  Component Value Date   ALBUMIN 3.5 03/17/2018   ALBUMIN 2.9 (L) 01/13/2018   ALBUMIN 3.4 (L) 01/10/2018   PREALBUMIN 14.7 (L) 04/02/2018    Neurologic: Patient does not have protective sensation bilateral lower extremities.   MUSCULOSKELETAL:   Skin: Examination patient has an ischemic ulcer 2 cm in diameter dorsal laterally over the third and fourth metatarsal heads.  There is no exposed bone.  There is no ascending cellulitis.  No purulent drainage.  Patient has a strong dorsalis pedis pulse on the right I cannot palpate a dorsalis pedis pulse on the left, however this may do to swelling.  Review of the  ankle-brachial indices shows excellent perfusion to the right lower extremity with decreased perfusion to the left lower extremity at the ankle, with significant decrease in flow out to the toes consistent more with a microcirculatory disorder.  The macrocirculation does seem to be enough to heal a dorsal ulcer.  Review of the MRI scan, edema around the ulcer but no definite osteomyelitis in the 4th metatarsals beneath the ulcer.  There is also edema in the first metatarsal which clinically does not appear to be osteomyelitis.  Patient has poorly controlled diabetes with a pre-albumin of 14.7 consistent  with protein caloric malnutrition.  Patient is also a smoker.  Assessment: Assessment: Diabetic insensate neuropathy with protein caloric malnutrition with morbid obesity BMI greater than 40 and currently has chronic smoker.  Plan: Plan: Patient must stop smoking completely,  would advise against any nicotine products including vaping.  Must improve her glucose control.  Must improve her nutritional status.  I will place an order for trental to see if this would help improve her microcirculation.  I will follow-up as an outpatient.  Continue Santyl dressing changes at discharge.  Thank you for the consult and the opportunity to see Ms. Tyler Aas, MD [facility] [phone] 7:49 PM

## 2018-04-04 NOTE — Anesthesia Postprocedure Evaluation (Signed)
Anesthesia Post Note  Patient: Diamond Rice  Procedure(s) Performed: COLONOSCOPY (N/A ) POLYPECTOMY BIOPSY     Patient location during evaluation: PACU Anesthesia Type: MAC Level of consciousness: awake and alert Pain management: pain level controlled Vital Signs Assessment: post-procedure vital signs reviewed and stable Respiratory status: spontaneous breathing, nonlabored ventilation and respiratory function stable Cardiovascular status: stable and blood pressure returned to baseline Postop Assessment: no apparent nausea or vomiting Anesthetic complications: no    Last Vitals:  Vitals:   04/04/18 1228 04/04/18 1240  BP: (!) 189/90 (!) 190/97  Pulse: 100 96  Resp: (!) 24 (!) 24  Temp: 36.7 C   SpO2: 100% 99%    Last Pain:  Vitals:   04/04/18 1240  TempSrc:   PainSc: 10-Worst pain ever                 Sheria Rosello,W. EDMOND

## 2018-04-04 NOTE — Anesthesia Preprocedure Evaluation (Addendum)
Anesthesia Evaluation  Patient identified by MRN, date of birth, ID band Patient awake    Reviewed: Allergy & Precautions, H&P , NPO status , Patient's Chart, lab work & pertinent test results, reviewed documented beta blocker date and time   Airway Mallampati: II  TM Distance: >3 FB Neck ROM: Full    Dental no notable dental hx. (+) Teeth Intact, Dental Advisory Given   Pulmonary sleep apnea and Continuous Positive Airway Pressure Ventilation , COPD, Current Smoker,    Pulmonary exam normal breath sounds clear to auscultation       Cardiovascular hypertension, Pt. on medications  Rhythm:Regular Rate:Normal     Neuro/Psych  Headaches, Anxiety Depression Bipolar Disorder Schizophrenia    GI/Hepatic negative GI ROS, Neg liver ROS,   Endo/Other  diabetes, Type 1, Insulin DependentMorbid obesity  Renal/GU negative Renal ROS  negative genitourinary   Musculoskeletal   Abdominal   Peds  Hematology negative hematology ROS (+)   Anesthesia Other Findings   Reproductive/Obstetrics negative OB ROS                           Anesthesia Physical Anesthesia Plan  ASA: III  Anesthesia Plan: MAC   Post-op Pain Management:    Induction: Intravenous  PONV Risk Score and Plan: 1 and Propofol infusion  Airway Management Planned: Simple Face Mask  Additional Equipment:   Intra-op Plan:   Post-operative Plan:   Informed Consent: I have reviewed the patients History and Physical, chart, labs and discussed the procedure including the risks, benefits and alternatives for the proposed anesthesia with the patient or authorized representative who has indicated his/her understanding and acceptance.   Dental advisory given  Plan Discussed with: CRNA, Anesthesiologist and Surgeon  Anesthesia Plan Comments:        Anesthesia Quick Evaluation

## 2018-04-05 LAB — BASIC METABOLIC PANEL
ANION GAP: 11 (ref 5–15)
BUN: 9 mg/dL (ref 6–20)
CHLORIDE: 108 mmol/L (ref 98–111)
CO2: 22 mmol/L (ref 22–32)
Calcium: 8.7 mg/dL — ABNORMAL LOW (ref 8.9–10.3)
Creatinine, Ser: 1.27 mg/dL — ABNORMAL HIGH (ref 0.44–1.00)
GFR calc non Af Amer: 50 mL/min — ABNORMAL LOW (ref >60)
GFR, EST AFRICAN AMERICAN: 58 mL/min — AB (ref >60)
GLUCOSE: 142 mg/dL — AB (ref 70–99)
Potassium: 3.3 mmol/L — ABNORMAL LOW (ref 3.5–5.1)
Sodium: 141 mmol/L (ref 135–145)

## 2018-04-05 LAB — HEMOGLOBIN: Hemoglobin: 8 g/dL — ABNORMAL LOW (ref 12.0–15.0)

## 2018-04-05 LAB — GLUCOSE, CAPILLARY
GLUCOSE-CAPILLARY: 235 mg/dL — AB (ref 70–99)
Glucose-Capillary: 142 mg/dL — ABNORMAL HIGH (ref 70–99)
Glucose-Capillary: 132 mg/dL — ABNORMAL HIGH (ref 70–99)
Glucose-Capillary: 155 mg/dL — ABNORMAL HIGH (ref 70–99)

## 2018-04-05 LAB — CBC
HEMATOCRIT: 23.9 % — AB (ref 36.0–46.0)
HEMOGLOBIN: 7.7 g/dL — AB (ref 12.0–15.0)
MCH: 23.1 pg — AB (ref 26.0–34.0)
MCHC: 32.2 g/dL (ref 30.0–36.0)
MCV: 71.8 fL — AB (ref 78.0–100.0)
Platelets: 305 10*3/uL (ref 150–400)
RBC: 3.33 MIL/uL — AB (ref 3.87–5.11)
RDW: 14.9 % (ref 11.5–15.5)
WBC: 9.6 10*3/uL (ref 4.0–10.5)

## 2018-04-05 MED ORDER — FUROSEMIDE 10 MG/ML IJ SOLN
40.0000 mg | Freq: Once | INTRAMUSCULAR | Status: AC
  Administered 2018-04-05: 40 mg via INTRAVENOUS
  Filled 2018-04-05: qty 4

## 2018-04-05 MED ORDER — POTASSIUM CHLORIDE CRYS ER 20 MEQ PO TBCR
40.0000 meq | EXTENDED_RELEASE_TABLET | Freq: Once | ORAL | Status: AC
  Administered 2018-04-05: 40 meq via ORAL
  Filled 2018-04-05: qty 2

## 2018-04-05 NOTE — Progress Notes (Signed)
PT Cancellation Note  Patient Details Name: Diamond Rice MRN: [id number] DOB: 1971/08/19   Cancelled Treatment:    Reason Eval/Treat Not Completed: Other (comment).  Mult attempts to see including during a meal and when MD is in to see her.  Will try again at another time.   Ramond Dial 04/05/2018, 6:14 PM   Mee Hives, PT MS Acute Rehab Dept. Number: [place] [facility] [place]

## 2018-04-05 NOTE — Progress Notes (Signed)
[facility]  PROGRESS NOTE  Diamond Rice SMO:[id number] DOB: Jan 25, 1971 DOA: 04/02/2018 PCP: [name], MD   Brief HPI:   47 yr old female with  history of anxiety,  COPD diabetes mellitus came to ED chest pain and hematochezia.  She was found to have guaiac positive stool.  Hemoglobin dropped from 11.7-10.4.  GI was consulted.    S   Patient seen and examined, complains of shortness of breath this morning.  Hemoglobin dropped to 7.7.  Repeat hemoglobin was 8.0.   Assessment/Plan:     1. Rectal bleeding-resolved, CT showed findings of proctitis.  Hemoglobin dropped to 8.1.  Patient underwent colonoscopy today, showed polyp status post polypectomy and rectal ulcers.  Will start MiraLAX twice a day indefinitely and hydrocortisone suppository once daily for 10 days as per GI recommendation. 2. Acute blood loss anemia-hemoglobin is stable at 8.0.  Improved from 7.7 this morning. 3. Mild pulmonary edema-patient complains of mild shortness of breath, will discontinue IV fluids.  1 dose of Lasix 40 mg IV x1.  Strict intake and output. 4. Chest pain-resolved, troponin x2 is negative.  Blood pressure is stable 5. Diabetic foot ulcer-wound care following.  MRI of the foot showsSoft tissue wound along the dorsal aspect of the forefoot overlying fourth metatarsal head with surrounding mild cellulitis.  Subcortical marrow edema in the first metatarsal head and base of the first proximal phalanx with surrounding soft tissue edema but without definite bone destruction.  Continue ceftriaxone and Flagyl.  Ordered ABI, also consulted orthopedics Dr. Sharol Given for further recommendations.  Dr. due to recommends no surgery, no evidence of osteomyelitis.  Continue wound care at home.  Also recommended.  Continue ceftriaxone, Flagyl. 6. Peripheral arterial disease-patient started on Trental to improve her microcirculation in left foot.  ABI shows excellent perfusion right lower extremity with reduced  perfusion left lower extremity at the ankle 7. Hypokalemia-potassium is 3.3, will replace potassium and check BMP in a.m. 8. Hypertension-blood pressure is stable, ACE inhibitor on hold due to worsening renal function. 9. Acute kidney injury-likely in setting of GI bleed, renal function is improved to 1.27. 10. Schizoaffective disorder-continue Abilify    DVT prophylaxis: SCD  Code Status: Full code  Family Communication: No family at bedside  Disposition Plan: likely home when medically ready for discharge   Consultants:  GI  Procedures:  None   Antibiotics:   Anti-infectives (From admission, onward)   Start     Dose/Rate Route Frequency Ordered Stop   04/02/18 1400  metroNIDAZOLE (FLAGYL) tablet 500 mg     500 mg Oral Every 8 hours 04/02/18 1109     04/02/18 1300  cefTRIAXone (ROCEPHIN) 2 g in sodium chloride 0.9 % 100 mL IVPB     2 g 200 mL/hr over 30 Minutes Intravenous Every 24 hours 04/02/18 1109         Objective   Vitals:   04/04/18 1324 04/04/18 2034 04/05/18 0530 04/05/18 1441  BP: (!) 160/83 140/81 (!) 147/78 129/75  Pulse: 100 95 92 84  Resp: 20 16 20 18   Temp: 98.6 F (37 C) 98.7 F (37.1 C) 98.6 F (37 C)   TempSrc: Oral Oral Oral   SpO2: 100% 99% 98% 98%  Weight:   98.2 kg   Height:        Intake/Output Summary (Last 24 hours) at 04/05/2018 1624 Last data filed at 04/05/2018 1214 Gross per 24 hour  Intake 4039.54 ml  Output 740 ml  Net 3299.54  ml   Filed Weights   04/04/18 0544 04/04/18 1139 04/05/18 0530  Weight: 97.4 kg 97.4 kg 98.2 kg     Physical Examination:   Physical Exam: Eyes: No icterus, extraocular muscles intact  Mouth: Oral mucosa is moist, no lesions on palate,  Neck: Supple, no deformities, masses, or tenderness Lungs: Normal respiratory effort, bilateral clear to auscultation, no crackles or wheezes.  Heart: Regular rate and rhythm, S1 and S2 normal, no murmurs, rubs auscultated Abdomen: BS  normoactive,soft,nondistended,non-tender to palpation,no organomegaly Extremities: Left foot in dressing Neuro : Alert and oriented to time, place and person, No focal deficits Skin: No rashes seen on exam    Data Reviewed: I have personally reviewed following labs and imaging studies  CBG: Recent Labs  Lab 04/04/18 1321 04/04/18 1701 04/04/18 2227 04/05/18 0752 04/05/18 1213  GLUCAP 105* 132* 246* 142* 132*    CBC: Recent Labs  Lab 04/02/18 0158 04/02/18 0612 04/02/18 1624  04/03/18 0508  04/04/18 1310 04/04/18 1633 04/04/18 2214 04/05/18 0541 04/05/18 1010  WBC 11.1*  --  11.5*  --  11.1*  --   --   --   --  9.6  --   HGB 11.7* 10.4* 9.6*   < > 9.5*   < > 8.9* 8.8* 9.0* 7.7* 8.0*  HCT 37.8 34.3* 30.8*  --  29.8*  --   --   --   --  23.9*  --   MCV 75.1*  --  73.3*  --  74.3*  --   --   --   --  71.8*  --   PLT 353  --  310  --  292  --   --   --   --  305  --    < > = values in this interval not displayed.    Basic Metabolic Panel: Recent Labs  Lab 04/02/18 0158 04/03/18 0508 04/05/18 0541  NA 136 136 141  K 2.9* 3.4* 3.3*  CL 99 100 108  CO2 26 23 22   GLUCOSE 207* 88 142*  BUN 17 16 9   CREATININE 1.63* 1.30* 1.27*  CALCIUM 9.1 8.6* 8.7*    Recent Results (from the past 240 hour(s))  Blood Cultures x 2 sites     Status: None (Preliminary result)   Collection Time: 04/02/18  4:15 PM  Result Value Ref Range Status   Specimen Description BLOOD LEFT ANTECUBITAL  Final   Special Requests   Final    BOTTLES DRAWN AEROBIC ONLY Blood Culture adequate volume   Culture   Final    NO GROWTH 3 DAYS Performed at [facility], [place] [address], [place], [place] [address]    Report Status PENDING  Incomplete  Blood Cultures x 2 sites     Status: None (Preliminary result)   Collection Time: 04/02/18  4:26 PM  Result Value Ref Range Status   Specimen Description BLOOD LEFT HAND  Final   Special Requests   Final    BOTTLES DRAWN AEROBIC ONLY Blood Culture  adequate volume   Culture   Final    NO GROWTH 3 DAYS Performed at [facility], [place] [address] [place],  [address]    Report Status PENDING  Incomplete     Liver Function Tests: No results for input(s): AST, ALT, ALKPHOS, BILITOT, PROT, ALBUMIN in the last 168 hours. No results for input(s): LIPASE, AMYLASE in the last 168 hours. No results for input(s): AMMONIA in the last 168  hours.  Cardiac Enzymes: Recent Labs  Lab 04/02/18 1014  TROPONINI <0.03   BNP (last 3 results) No results for input(s): BNP in the last 8760 hours.  ProBNP (last 3 results) No results for input(s): PROBNP in the last 8760 hours.    Studies: No results found.  Scheduled Meds: . ARIPiprazole  15 mg Oral QHS  . cloNIDine  0.3 mg Oral TID  . collagenase   Topical Daily  . feeding supplement (GLUCERNA SHAKE)  237 mL Oral TID BM  . hydrALAZINE  50 mg Oral Q8H  . hydrocortisone  25 mg Rectal Daily  . insulin aspart  0-15 Units Subcutaneous TID WC  . insulin aspart  0-5 Units Subcutaneous QHS  . insulin detemir  15 Units Subcutaneous BID  . metroNIDAZOLE  500 mg Oral Q8H  . multivitamin with minerals  1 tablet Oral Daily  . pentoxifylline  400 mg Oral TID WC  . polyethylene glycol  17 g Oral BID  . sodium chloride flush  3 mL Intravenous Q12H      Time spent: 20 min  Oswald Hillock   [facility] Pager [phone]. If 7PM-7AM, please contact night-coverage at [email or web address], Office  [phone]  password [email or web address]  04/05/2018, 4:24 PM  LOS: 2 days

## 2018-04-06 LAB — BASIC METABOLIC PANEL
Anion gap: 8 (ref 5–15)
BUN: 8 mg/dL (ref 6–20)
CALCIUM: 8.6 mg/dL — AB (ref 8.9–10.3)
CO2: 23 mmol/L (ref 22–32)
Chloride: 107 mmol/L (ref 98–111)
Creatinine, Ser: 1.16 mg/dL — ABNORMAL HIGH (ref 0.44–1.00)
GFR calc Af Amer: >60 mL/min (ref >60)
GFR, EST NON AFRICAN AMERICAN: 56 mL/min — AB (ref >60)
GLUCOSE: 118 mg/dL — AB (ref 70–99)
Potassium: 3.4 mmol/L — ABNORMAL LOW (ref 3.5–5.1)
Sodium: 138 mmol/L (ref 135–145)

## 2018-04-06 LAB — CBC
HCT: 24.3 % — ABNORMAL LOW (ref 36.0–46.0)
HEMOGLOBIN: 7.8 g/dL — AB (ref 12.0–15.0)
MCH: 23.1 pg — AB (ref 26.0–34.0)
MCHC: 32.1 g/dL (ref 30.0–36.0)
MCV: 72.1 fL — ABNORMAL LOW (ref 78.0–100.0)
Platelets: 334 10*3/uL (ref 150–400)
RBC: 3.37 MIL/uL — ABNORMAL LOW (ref 3.87–5.11)
RDW: 15.3 % (ref 11.5–15.5)
WBC: 9.7 10*3/uL (ref 4.0–10.5)

## 2018-04-06 LAB — GLUCOSE, CAPILLARY
GLUCOSE-CAPILLARY: 107 mg/dL — AB (ref 70–99)
GLUCOSE-CAPILLARY: 165 mg/dL — AB (ref 70–99)
Glucose-Capillary: 154 mg/dL — ABNORMAL HIGH (ref 70–99)
Glucose-Capillary: 225 mg/dL — ABNORMAL HIGH (ref 70–99)

## 2018-04-06 MED ORDER — POTASSIUM CHLORIDE CRYS ER 20 MEQ PO TBCR
20.0000 meq | EXTENDED_RELEASE_TABLET | Freq: Once | ORAL | Status: AC
  Administered 2018-04-06: 20 meq via ORAL
  Filled 2018-04-06: qty 1

## 2018-04-06 MED ORDER — GABAPENTIN 100 MG PO CAPS
100.0000 mg | ORAL_CAPSULE | Freq: Three times a day (TID) | ORAL | Status: DC
  Administered 2018-04-06 – 2018-04-12 (×19): 100 mg via ORAL
  Filled 2018-04-06 (×19): qty 1

## 2018-04-06 MED ORDER — ZOLPIDEM TARTRATE 5 MG PO TABS
5.0000 mg | ORAL_TABLET | Freq: Once | ORAL | Status: AC
  Administered 2018-04-06: 5 mg via ORAL
  Filled 2018-04-06: qty 1

## 2018-04-06 NOTE — Progress Notes (Signed)
Would debriding this wound help?

## 2018-04-06 NOTE — Progress Notes (Signed)
Pt was encouraged to drink and move today.  Pt refused both activities multiple times.  Urine is very dark amber and has an odor of strong, concentrated urine.

## 2018-04-06 NOTE — Progress Notes (Signed)
[facility]  PROGRESS NOTE  Diamond Rice DUK:[id number] DOB: 1970-12-06 DOA: 04/02/2018 PCP: [name], MD   Brief HPI:   47 yr old female with  history of anxiety,  COPD diabetes mellitus came to ED chest pain and hematochezia.  She was found to have guaiac positive stool.  Hemoglobin dropped from 11.7-10.4.  GI was consulted.    S   Patient seen and examined complains of left foot pain.   Assessment/Plan:     1. Rectal bleeding-resolved, CT showed findings of proctitis.  Hemoglobin dropped to 8.1.  Patient underwent colonoscopy today, showed polyp status post polypectomy and rectal ulcers.  Will start MiraLAX twice a day indefinitely and hydrocortisone suppository once daily for 10 days as per GI recommendation. 2. Acute blood loss anemia-hemoglobin is stable at 7.8. 3. Mild pulmonary edema-result, did not require oxygen.  Patient complained of mild shortness of breath, .  1 dose of Lasix 40 mg IV x1 was given.   4. Chest pain-resolved, troponin x 2 is negative.  Blood pressure is stable 5. Diabetic foot ulcer-wound care following.  MRI of the foot showsSoft tissue wound along the dorsal aspect of the forefoot overlying fourth metatarsal head with surrounding mild cellulitis.  Subcortical marrow edema in the first metatarsal head and base of the first proximal phalanx with surrounding soft tissue edema but without definite bone destruction.  Continue ceftriaxone and Flagyl.  Ordered ABI, also consulted orthopedics Dr. Sharol Given for further recommendations.  Dr. due to recommends no surgery, no evidence of osteomyelitis.   Continue ceftriaxone, Flagyl.  Was started on Neurontin 100 mg po tid for neuropathic pain. 6. Peripheral arterial disease-patient started on Trental to improve her microcirculation in left foot.  ABI shows excellent perfusion right lower extremity with reduced perfusion left lower extremity at the ankle 7. Hypokalemia-potassium is 3.4, will replace potassium  and check BMP in a.m. 8. Hypertension-blood pressure is stable, ACE inhibitor on hold due to worsening renal function. 9. Acute kidney injury-likely in setting of GI bleed, renal function is improved to 116. 10. Schizoaffective disorder-continue Abilify    DVT prophylaxis: SCD  Code Status: Full code  Family Communication: No family at bedside  Disposition Plan: Skilled nursing facility.  Consultants:  GI  Procedures:  None   Antibiotics:   Anti-infectives (From admission, onward)   Start     Dose/Rate Route Frequency Ordered Stop   04/02/18 1400  metroNIDAZOLE (FLAGYL) tablet 500 mg     500 mg Oral Every 8 hours 04/02/18 1109     04/02/18 1300  cefTRIAXone (ROCEPHIN) 2 g in sodium chloride 0.9 % 100 mL IVPB     2 g 200 mL/hr over 30 Minutes Intravenous Every 24 hours 04/02/18 1109         Objective   Vitals:   04/05/18 1441 04/05/18 2118 04/06/18 0622 04/06/18 1331  BP: 129/75 (!) 143/82 136/80 (!) 151/90  Pulse: 84 83 78 85  Resp: 18 (!) 25 (!) 24 20  Temp:  99.1 F (37.3 C) 99.4 F (37.4 C) 98.2 F (36.8 C)  TempSrc:  Oral Oral Oral  SpO2: 98% 100% 99% 99%  Weight:      Height:        Intake/Output Summary (Last 24 hours) at 04/06/2018 1426 Last data filed at 04/06/2018 1100 Gross per 24 hour  Intake 1366.24 ml  Output 1900 ml  Net -533.76 ml   Filed Weights   04/04/18 0544 04/04/18 1139 04/05/18 0530  Weight:  97.4 kg 97.4 kg 98.2 kg     Physical Examination:   Eyes: No icterus, extraocular muscles intact  Mouth: Oral mucosa is moist, no lesions on palate,  Neck: Supple, no deformities, masses, or tenderness Lungs: Normal respiratory effort, bilateral clear to auscultation, no crackles or wheezes.  Heart: Regular rate and rhythm, S1 and S2 normal, no murmurs, rubs auscultated Abdomen: BS normoactive,soft,nondistended,non-tender to palpation,no organomegaly Extremities: No pretibial edema, no erythema, no cyanosis, no clubbing Neuro : Alert  and oriented to time, place and person, No focal deficits Skin: No rashes seen on exam     Data Reviewed: I have personally reviewed following labs and imaging studies  CBG: Recent Labs  Lab 04/05/18 1213 04/05/18 1636 04/05/18 2116 04/06/18 0809 04/06/18 1203  GLUCAP 132* 155* 235* 107* 165*    CBC: Recent Labs  Lab 04/02/18 0158 04/02/18 0612 04/02/18 1624  04/03/18 0508  04/04/18 1633 04/04/18 2214 04/05/18 0541 04/05/18 1010 04/06/18 0431  WBC 11.1*  --  11.5*  --  11.1*  --   --   --  9.6  --  9.7  HGB 11.7* 10.4* 9.6*   < > 9.5*   < > 8.8* 9.0* 7.7* 8.0* 7.8*  HCT 37.8 34.3* 30.8*  --  29.8*  --   --   --  23.9*  --  24.3*  MCV 75.1*  --  73.3*  --  74.3*  --   --   --  71.8*  --  72.1*  PLT 353  --  310  --  292  --   --   --  305  --  334   < > = values in this interval not displayed.    Basic Metabolic Panel: Recent Labs  Lab 04/02/18 0158 04/03/18 0508 04/05/18 0541 04/06/18 0431  NA 136 136 141 138  K 2.9* 3.4* 3.3* 3.4*  CL 99 100 108 107  CO2 26 23 22 23   GLUCOSE 207* 88 142* 118*  BUN 17 16 9 8   CREATININE 1.63* 1.30* 1.27* 1.16*  CALCIUM 9.1 8.6* 8.7* 8.6*    Recent Results (from the past 240 hour(s))  Blood Cultures x 2 sites     Status: None (Preliminary result)   Collection Time: 04/02/18  4:15 PM  Result Value Ref Range Status   Specimen Description BLOOD LEFT ANTECUBITAL  Final   Special Requests   Final    BOTTLES DRAWN AEROBIC ONLY Blood Culture adequate volume   Culture   Final    NO GROWTH 4 DAYS Performed at [facility], [address], [place], [place] [address]    Report Status PENDING  Incomplete  Blood Cultures x 2 sites     Status: None (Preliminary result)   Collection Time: 04/02/18  4:26 PM  Result Value Ref Range Status   Specimen Description BLOOD LEFT HAND  Final   Special Requests   Final    BOTTLES DRAWN AEROBIC ONLY Blood Culture adequate volume   Culture   Final    NO GROWTH 4 DAYS Performed at  [facility], [place] [address] [place], [place] [address]    Report Status PENDING  Incomplete      Cardiac Enzymes: Recent Labs  Lab 04/02/18 1014  TROPONINI <0.03      Studies: No results found.  Scheduled Meds: . ARIPiprazole  15 mg Oral QHS  . cloNIDine  0.3 mg Oral TID  . collagenase   Topical Daily  .  feeding supplement (GLUCERNA SHAKE)  237 mL Oral TID BM  . gabapentin  100 mg Oral TID  . hydrALAZINE  50 mg Oral Q8H  . hydrocortisone  25 mg Rectal Daily  . insulin aspart  0-15 Units Subcutaneous TID WC  . insulin aspart  0-5 Units Subcutaneous QHS  . insulin detemir  15 Units Subcutaneous BID  . metroNIDAZOLE  500 mg Oral Q8H  . multivitamin with minerals  1 tablet Oral Daily  . pentoxifylline  400 mg Oral TID WC  . polyethylene glycol  17 g Oral BID  . sodium chloride flush  3 mL Intravenous Q12H      Time spent: 20 min  Oswald Hillock   [facility] Pager [phone]. If 7PM-7AM, please contact night-coverage at [email or web address], Office  [phone]  password [email or web address]  04/06/2018, 2:26 PM  LOS: 3 days

## 2018-04-06 NOTE — Progress Notes (Signed)
Physical Therapy Treatment Patient Details Name: Diamond Rice MRN: [id number] DOB: 29-Jun-1971 Today's Date: 04/06/2018    History of Present Illness Diamond Rice is a 47 y.o. female with medical history significant of DM; CVA; OSA; HTN; HLD; and COPD presenting with rectal bleeding.     PT Comments    Pt is making slow progress towards her goals today, however is severely limited in her mobility by L foot pain which increases with weightbearing. Pt currently requires supervision for bed mobility, min A for power up to standing in RW and minA for steadying with RW for 3 feet ambulation forward and 3 feet back to bed. Pt unable to progress gait further due to increase in pain. PT recommendations for SNF d/c remain appropriate. PT will continue to follow acutely to progress mobility until d/c.     Follow Up Recommendations  SNF     Equipment Recommendations  None recommended by PT    Recommendations for Other Services       Precautions / Restrictions Precautions Precautions: Fall Precaution Comments: L foot ulcer Restrictions Weight Bearing Restrictions: No    Mobility  Bed Mobility Overal bed mobility: Needs Assistance Bed Mobility: Supine to Sit;Sit to Supine     Supine to sit: HOB elevated;Min guard Sit to supine: Supervision   General bed mobility comments: increased time and effort especially for movement of L LE   Transfers Overall transfer level: Needs assistance Equipment used: Rolling walker (2 wheeled) Transfers: Sit to/from Stand Sit to Stand: Min assist         General transfer comment: min A for power up and steadying in RW  Ambulation/Gait Ambulation/Gait assistance: Min assist Gait Distance (Feet): 6 Feet(3 forward and 3 back) Assistive device: Rolling walker (2 wheeled) Gait Pattern/deviations: Step-to pattern;Decreased weight shift to left;Decreased stance time - left;Decreased step length - right;Ataxic Gait velocity: decreased Gait  velocity interpretation: <1.31 ft/sec, indicative of household ambulator General Gait Details: minA for steadying with RW, vc for increased UE support to advance L LE, pt unable to provide increased support through L UE secondary to weakness from prior CVA       Balance Overall balance assessment: Needs assistance Sitting-balance support: Feet unsupported Sitting balance-Leahy Scale: Good     Standing balance support: During functional activity;Bilateral upper extremity supported Standing balance-Leahy Scale: Poor Standing balance comment: requires support to offload L LE                             Cognition Arousal/Alertness: Awake/alert Behavior During Therapy: WFL for tasks assessed/performed Overall Cognitive Status: Within Functional Limits for tasks assessed                                           General Comments General comments (skin integrity, edema, etc.): at rest HR 80 bpm, with ambulation reached 128 bpm secondary to pain       Pertinent Vitals/Pain Pain Assessment: 0-10 Pain Score: 10-Worst pain ever Pain Location: L foot 4/10 at rest after pain medication, 10/10 when trying to ambulate 3 feet from bed Pain Descriptors / Indicators: Burning;Throbbing Pain Intervention(s): Limited activity within patient's tolerance;Monitored during session;Premedicated before session;Repositioned           PT Goals (current goals can now be found in the care plan section) Acute Rehab PT Goals PT  Goal Formulation: With patient Time For Goal Achievement: 04/18/18 Potential to Achieve Goals: Good Progress towards PT goals: Progressing toward goals    Frequency    Min 2X/week      PT Plan Current plan remains appropriate    Co-evaluation              AM-PAC PT "6 Clicks" Daily Activity  Outcome Measure  Difficulty turning over in bed (including adjusting bedclothes, sheets and blankets)?: A Little Difficulty moving from lying  on back to sitting on the side of the bed? : A Little Difficulty sitting down on and standing up from a chair with arms (e.g., wheelchair, bedside commode, etc,.)?: Unable Help needed moving to and from a bed to chair (including a wheelchair)?: A Little Help needed walking in hospital room?: Total Help needed climbing 3-5 steps with a railing? : Total 6 Click Score: 12    End of Session Equipment Utilized During Treatment: Gait belt Activity Tolerance: Patient limited by pain Patient left: in bed;with call bell/phone within reach Nurse Communication: Mobility status PT Visit Diagnosis: Pain;Other abnormalities of gait and mobility (R26.89);History of falling (Z91.81) Pain - Right/Left: Left Pain - part of body: Ankle and joints of foot     Time: 7903-8333 PT Time Calculation (min) (ACUTE ONLY): 35 min  Charges:  $Gait Training: 8-22 mins $Therapeutic Activity: 8-22 mins                     Diamond Rice B. Migdalia Dk PT, DPT Acute Rehabilitation  [phone] Pager [phone]     [place] 04/06/2018, 12:56 PM

## 2018-04-07 LAB — CULTURE, BLOOD (ROUTINE X 2)
CULTURE: NO GROWTH
Culture: NO GROWTH
SPECIAL REQUESTS: ADEQUATE
Special Requests: ADEQUATE

## 2018-04-07 LAB — GLUCOSE, CAPILLARY
GLUCOSE-CAPILLARY: 125 mg/dL — AB (ref 70–99)
Glucose-Capillary: 220 mg/dL — ABNORMAL HIGH (ref 70–99)
Glucose-Capillary: 200 mg/dL — ABNORMAL HIGH (ref 70–99)
Glucose-Capillary: 282 mg/dL — ABNORMAL HIGH (ref 70–99)

## 2018-04-07 MED ORDER — ZOLPIDEM TARTRATE 5 MG PO TABS
5.0000 mg | ORAL_TABLET | Freq: Every evening | ORAL | Status: DC | PRN
  Administered 2018-04-07 – 2018-04-11 (×5): 5 mg via ORAL
  Filled 2018-04-07 (×5): qty 1

## 2018-04-07 MED ORDER — ENOXAPARIN SODIUM 40 MG/0.4ML ~~LOC~~ SOLN
40.0000 mg | SUBCUTANEOUS | Status: DC
  Administered 2018-04-07 – 2018-04-11 (×5): 40 mg via SUBCUTANEOUS
  Filled 2018-04-07 (×5): qty 0.4

## 2018-04-07 NOTE — Progress Notes (Signed)
[facility]  PROGRESS NOTE  Diamond Rice KPT:[id number] DOB: 1971/04/24 DOA: 04/02/2018 PCP: [name], MD   Brief HPI:   47 yr old female with  history of anxiety,  COPD diabetes mellitus came to ED chest pain and hematochezia.  She was found to have guaiac positive stool.  Hemoglobin dropped from 11.7-10.4.  GI was consulted.    S   Patient seen and examined, feels that pain is better after starting Neurontin.   Assessment/Plan:     1. Rectal bleeding-resolved, CT showed findings of proctitis.  Hemoglobin dropped to 8.1.  Patient underwent colonoscopy today, showed polyp status post polypectomy and rectal ulcers.  Will start MiraLAX twice a day indefinitely and hydrocortisone suppository once daily for 10 days as per GI recommendation. 2. Acute blood loss anemia-hemoglobin is stable at 7.8. 3. Mild pulmonary edema-resolved, did not require oxygen.  Patient complained of mild shortness of breath, .  1 dose of Lasix 40 mg IV x1 was given.   4. Chest pain-resolved, troponin x 2 is negative.  Blood pressure is stable 5. Diabetic foot ulcer-wound care following.  MRI of the foot showsSoft tissue wound along the dorsal aspect of the forefoot overlying fourth metatarsal head with surrounding mild cellulitis.  Subcortical marrow edema in the first metatarsal head and base of the first proximal phalanx with surrounding soft tissue edema but without definite bone destruction.  Continue ceftriaxone and Flagyl.  Ordered ABI, also consulted orthopedics Dr. Sharol Given for further recommendations.  Dr.Duda recommends no surgery, no evidence of osteomyelitis.   Continue ceftriaxone, Flagyl.  Continue Neurontin 100 mg po tid for neuropathic pain. 6. Peripheral arterial disease-patient started on Trental to improve her microcirculation in left foot.  ABI shows excellent perfusion right lower extremity with reduced perfusion left lower extremity at the ankle 7. Hypokalemia-potassium 3.4, will  replace potassium and check BMP in a.m. 8. Hypertension-blood pressure is stable, ACE inhibitor on hold due to worsening renal function. 9. Acute kidney injury-likely in setting of GI bleed, renal function is improved to 1.16.  Check BMP in a.m. 10. Schizoaffective disorder-continue Abilify    DVT prophylaxis: SCD  Code Status: Full code  Family Communication: No family at bedside  Disposition Plan: Skilled nursing facility.  Consultants:  GI  Procedures:  None   Antibiotics:   Anti-infectives (From admission, onward)   Start     Dose/Rate Route Frequency Ordered Stop   04/02/18 1400  metroNIDAZOLE (FLAGYL) tablet 500 mg     500 mg Oral Every 8 hours 04/02/18 1109     04/02/18 1300  cefTRIAXone (ROCEPHIN) 2 g in sodium chloride 0.9 % 100 mL IVPB     2 g 200 mL/hr over 30 Minutes Intravenous Every 24 hours 04/02/18 1109         Objective   Vitals:   04/06/18 2152 04/07/18 0430 04/07/18 0940 04/07/18 1446  BP: (!) 153/88 (!) 154/95 (!) 145/89 133/73  Pulse: 76 84  94  Resp: 20 (!) 22  18  Temp: 98.3 F (36.8 C) 98.6 F (37 C)  98.9 F (37.2 C)  TempSrc: Oral Oral    SpO2: 100% 99%  98%  Weight:      Height:        Intake/Output Summary (Last 24 hours) at 04/07/2018 1627 Last data filed at 04/07/2018 0600 Gross per 24 hour  Intake 717 ml  Output 710 ml  Net 7 ml   Filed Weights   04/04/18 0544 04/04/18 1139 04/05/18  0530  Weight: 97.4 kg 97.4 kg 98.2 kg     Physical Examination:   Physical Exam: Eyes: No icterus, extraocular muscles intact  Mouth: Oral mucosa is moist, no lesions on palate,  Neck: Supple, no deformities, masses, or tenderness Lungs: Normal respiratory effort, bilateral clear to auscultation, no crackles or wheezes.  Heart: Regular rate and rhythm, S1 and S2 normal, no murmurs, rubs auscultated Abdomen: BS normoactive,soft,nondistended,non-tender to palpation,no organomegaly Extremities: Left foot in dressing Neuro : Alert and  oriented to time, place and person, No focal deficits Skin: No rashes seen on exam    Data Reviewed: I have personally reviewed following labs and imaging studies  CBG: Recent Labs  Lab 04/06/18 1203 04/06/18 1655 04/06/18 2119 04/07/18 0745 04/07/18 1245  GLUCAP 165* 154* 225* 125* 220*    CBC: Recent Labs  Lab 04/02/18 0158 04/02/18 0612 04/02/18 1624  04/03/18 0508  04/04/18 1633 04/04/18 2214 04/05/18 0541 04/05/18 1010 04/06/18 0431  WBC 11.1*  --  11.5*  --  11.1*  --   --   --  9.6  --  9.7  HGB 11.7* 10.4* 9.6*   < > 9.5*   < > 8.8* 9.0* 7.7* 8.0* 7.8*  HCT 37.8 34.3* 30.8*  --  29.8*  --   --   --  23.9*  --  24.3*  MCV 75.1*  --  73.3*  --  74.3*  --   --   --  71.8*  --  72.1*  PLT 353  --  310  --  292  --   --   --  305  --  334   < > = values in this interval not displayed.    Basic Metabolic Panel: Recent Labs  Lab 04/02/18 0158 04/03/18 0508 04/05/18 0541 04/06/18 0431  NA 136 136 141 138  K 2.9* 3.4* 3.3* 3.4*  CL 99 100 108 107  CO2 26 23 22 23   GLUCOSE 207* 88 142* 118*  BUN 17 16 9 8   CREATININE 1.63* 1.30* 1.27* 1.16*  CALCIUM 9.1 8.6* 8.7* 8.6*    Recent Results (from the past 240 hour(s))  Blood Cultures x 2 sites     Status: None   Collection Time: 04/02/18  4:15 PM  Result Value Ref Range Status   Specimen Description BLOOD LEFT ANTECUBITAL  Final   Special Requests   Final    BOTTLES DRAWN AEROBIC ONLY Blood Culture adequate volume   Culture   Final    NO GROWTH 5 DAYS Performed at [facility], [address] [place], [place] [address]    Report Status 04/07/2018 FINAL  Final  Blood Cultures x 2 sites     Status: None   Collection Time: 04/02/18  4:26 PM  Result Value Ref Range Status   Specimen Description BLOOD LEFT HAND  Final   Special Requests   Final    BOTTLES DRAWN AEROBIC ONLY Blood Culture adequate volume   Culture   Final    NO GROWTH 5 DAYS Performed at [facility], [place] [address],  [place], [place] [address]    Report Status 04/07/2018 FINAL  Final      Cardiac Enzymes: Recent Labs  Lab 04/02/18 1014  TROPONINI <0.03      Studies: No results found.  Scheduled Meds: . ARIPiprazole  15 mg Oral QHS  . cloNIDine  0.3 mg Oral TID  . collagenase   Topical Daily  . feeding supplement (  GLUCERNA SHAKE)  237 mL Oral TID BM  . gabapentin  100 mg Oral TID  . hydrALAZINE  50 mg Oral Q8H  . hydrocortisone  25 mg Rectal Daily  . insulin aspart  0-15 Units Subcutaneous TID WC  . insulin aspart  0-5 Units Subcutaneous QHS  . insulin detemir  15 Units Subcutaneous BID  . metroNIDAZOLE  500 mg Oral Q8H  . multivitamin with minerals  1 tablet Oral Daily  . pentoxifylline  400 mg Oral TID WC  . polyethylene glycol  17 g Oral BID  . sodium chloride flush  3 mL Intravenous Q12H      Time spent: 20 min  Oswald Hillock   [facility] Pager [phone]. If 7PM-7AM, please contact night-coverage at [email or web address], Office  [phone]  password [email or web address]  04/07/2018, 4:27 PM  LOS: 4 days

## 2018-04-08 ENCOUNTER — Inpatient Hospital Stay (HOSPITAL_COMMUNITY): Payer: Medicare Other

## 2018-04-08 DIAGNOSIS — I5033 Acute on chronic diastolic (congestive) heart failure: Secondary | ICD-10-CM

## 2018-04-08 LAB — BASIC METABOLIC PANEL
ANION GAP: 9 (ref 5–15)
BUN: 10 mg/dL (ref 6–20)
CHLORIDE: 106 mmol/L (ref 98–111)
CO2: 23 mmol/L (ref 22–32)
Calcium: 8.9 mg/dL (ref 8.9–10.3)
Creatinine, Ser: 1.05 mg/dL — ABNORMAL HIGH (ref 0.44–1.00)
Glucose, Bld: 123 mg/dL — ABNORMAL HIGH (ref 70–99)
Potassium: 3.8 mmol/L (ref 3.5–5.1)
SODIUM: 138 mmol/L (ref 135–145)

## 2018-04-08 LAB — CBC
HEMATOCRIT: 24.7 % — AB (ref 36.0–46.0)
HEMOGLOBIN: 7.9 g/dL — AB (ref 12.0–15.0)
MCH: 22.8 pg — ABNORMAL LOW (ref 26.0–34.0)
MCHC: 32 g/dL (ref 30.0–36.0)
MCV: 71.2 fL — ABNORMAL LOW (ref 78.0–100.0)
Platelets: 368 10*3/uL (ref 150–400)
RBC: 3.47 MIL/uL — AB (ref 3.87–5.11)
RDW: 14.9 % (ref 11.5–15.5)
WBC: 14.2 10*3/uL — AB (ref 4.0–10.5)

## 2018-04-08 LAB — GLUCOSE, CAPILLARY
GLUCOSE-CAPILLARY: 117 mg/dL — AB (ref 70–99)
GLUCOSE-CAPILLARY: 138 mg/dL — AB (ref 70–99)
GLUCOSE-CAPILLARY: 143 mg/dL — AB (ref 70–99)
GLUCOSE-CAPILLARY: 190 mg/dL — AB (ref 70–99)

## 2018-04-08 MED ORDER — FUROSEMIDE 10 MG/ML IJ SOLN
40.0000 mg | Freq: Once | INTRAMUSCULAR | Status: AC
  Administered 2018-04-08: 40 mg via INTRAVENOUS
  Filled 2018-04-08: qty 4

## 2018-04-08 MED ORDER — OCUVITE-LUTEIN PO CAPS
1.0000 | ORAL_CAPSULE | Freq: Every day | ORAL | Status: DC
  Filled 2018-04-08 (×4): qty 1

## 2018-04-08 NOTE — Care Management Important Message (Signed)
Important Message  Patient Details  Name: Diamond Rice MRN: [id number] Date of Birth: 1971/06/30   Medicare Important Message Given:  Yes  Signed on 04/05/2018  Orbie Pyo 04/08/2018, 8:17 AM

## 2018-04-08 NOTE — Progress Notes (Signed)
Pt BP elevated at 181/93 at 0450, scheduled hydralazine given. During report no complaines, after report RN entered pt room to reassess BP and pt c/o SOB. She denied chest pain at this time, lungs clear on auscultation diminished in lower lobes. Temp 99.1, BP 180/103, HR 98, RR 40, SpO2 97 on room air. Will place pt on O2 2 l nasal cannula for comfort and give PRN hydralazine. New order placed per MD request.  Will continue to monitor and treat pt per MD order.

## 2018-04-08 NOTE — Progress Notes (Signed)
Nutrition Follow Up Note   DOCUMENTATION CODES:   Obesity unspecified  INTERVENTION:   Glucerna Shake po TID, each supplement provides 220 kcal and 10 grams of protein  Ocuvite daily for wound healing (provides zinc, vitamin A, vitamin C, Vitamin E, copper, and selenium)  MVI daily  NUTRITION DIAGNOSIS:   Increased nutrient needs related to wound healing as evidenced by increased estimated needs.  GOAL:   Patient will meet greater than or equal to 90% of their needs  MONITOR:   PO intake, Supplement acceptance, Weight trends, skin   ASSESSMENT:   47 y.o. female with medical history significant of DM; CVA; OSA; HTN; HLD; and COPD presenting with rectal bleeding.    Met with pt in room today. Pt reports poor appetite and oral intake today and pta. Pt currently eating 35% of meals. Pt is drinking the Glucerna. RD will add ocuvite to encourage wound healing. Pt may benefit from appetite stimulant. No BM noted since 8/8; recommend bowel regimen per MD. If oral intake does not improve; recommend switch supplement to Ensure as this provides more protein and calories as compared to Glucerna. Per chart, pt with weight gain since admit; pt +6L on her I & Os.   Medications reviewed and include: lovneox, insulin, MVI, miralax, ceftriaxone, metronidazole  Labs reviewed: wbc- 14.2(H), Hgb 7.9(L), Hct 24.7(L), MCV 71.2(L), MCH 22.8(L)  Nutrition-Focused physical exam completed. Findings are no fat depletion, no muscle depletion, and mild edema.   Diet Order:   Diet Order            Diet Carb Modified Fluid consistency: Thin; Room service appropriate? Yes  Diet effective now             EDUCATION NEEDS:   Not appropriate for education at this time  Skin:  Skin Assessment: Skin Integrity Issues: Skin Integrity Issues:: Diabetic Ulcer Diabetic Ulcer: To L foot  Last BM:  04/04/2018  Height:   Ht Readings from Last 1 Encounters:  04/04/18 _0  (1.549 m)    Weight:   Wt  Readings from Last 1 Encounters:  04/05/18 98.2 kg    Ideal Body Weight:  47.72 kg  BMI:  Body mass index is 40.91 kg/m.  Estimated Nutritional Needs:   Kcal:  1700-2000kcal/day   Protein:  98-108g/day   Fluid:  >1.7L/day   Koleen Distance MS, RD, LDN Pager #- [phone] Office#- [phone] After Hours Pager: [phone]

## 2018-04-08 NOTE — Progress Notes (Signed)
[facility]  PROGRESS NOTE  Diamond Rice VEL:[id number] DOB: [birth year] DOA: 04/02/2018 PCP: [name], MD   Brief HPI:   47 yr old female with  history of anxiety,  COPD diabetes mellitus came to ED chest pain and hematochezia.  She was found to have guaiac positive stool.  Hemoglobin dropped from 11.7-10.4.  GI was consulted.    S   Patient seen and examined, complains of shortness of breath this morning.   Assessment/Plan:     1. Rectal bleeding-resolved, CT showed findings of proctitis.  Hemoglobin dropped to 8.1.  Patient underwent colonoscopy today, showed polyp status post polypectomy and rectal ulcers.  Will start MiraLAX twice a day indefinitely and hydrocortisone suppository once daily for 10 days as per GI recommendation. 2. Acute blood loss anemia-hemoglobin is stable at 7.8. 3. Acute diastolic CHF -patient has history of diastolic CHF, she has developed mild pulmonary edema.  She diuresed very well with 1 dose of Lasix 40 mg IV.  Will start Lasix 20 mg IV every 12 hours.  Strict intake and output.  Follow BMP in am.     4. Chest pain-resolved, troponin x 2 is negative.  Blood pressure is stable 5. Diabetic foot ulcer-wound care following.  MRI of the foot showsSoft tissue wound along the dorsal aspect of the forefoot overlying fourth metatarsal head with surrounding mild cellulitis.  Subcortical marrow edema in the first metatarsal head and base of the first proximal phalanx with surrounding soft tissue edema but without definite bone destruction.  Continue ceftriaxone and Flagyl.  Ordered ABI, also consulted orthopedics Dr. Sharol Given for further recommendations.  Dr.Duda recommends no surgery, no evidence of osteomyelitis.   Continue ceftriaxone, Flagyl.  Continue Neurontin 100 mg po tid for neuropathic pain. 6. Peripheral arterial disease-patient started on Trental to improve her microcirculation in left foot.  ABI shows excellent perfusion right lower extremity  with reduced perfusion left lower extremity at the ankle 7. Hypokalemia-replete 8. Hypertension-blood pressure is stable, ACE inhibitor on hold due to worsening renal function. 9. Acute kidney injury-likely in setting of GI bleed, renal function is improved to 1.16.   10. Schizoaffective disorder-continue Abilify    DVT prophylaxis: SCD  Code Status: Full code  Family Communication: No family at bedside  Disposition Plan: Skilled nursing facility.  Consultants:  GI  Procedures:  None   Antibiotics:   Anti-infectives (From admission, onward)   Start     Dose/Rate Route Frequency Ordered Stop   04/02/18 1400  metroNIDAZOLE (FLAGYL) tablet 500 mg     500 mg Oral Every 8 hours 04/02/18 1109     04/02/18 1300  cefTRIAXone (ROCEPHIN) 2 g in sodium chloride 0.9 % 100 mL IVPB     2 g 200 mL/hr over 30 Minutes Intravenous Every 24 hours 04/02/18 1109         Objective   Vitals:   04/08/18 0942 04/08/18 1057 04/08/18 1407 04/08/18 1635  BP:  137/83 (!) 145/90 138/74  Pulse: 98  88   Resp: 16  18   Temp:   98.7 F (37.1 C)   TempSrc:   Oral   SpO2: 99%     Weight:      Height:        Intake/Output Summary (Last 24 hours) at 04/08/2018 1725 Last data filed at 04/08/2018 1500 Gross per 24 hour  Intake 340.86 ml  Output 250 ml  Net 90.86 ml   Filed Weights   04/04/18 0544 04/04/18 1139  04/05/18 0530  Weight: 97.4 kg 97.4 kg 98.2 kg     Physical Examination:   Mouth: Oral mucosa is moist, no lesions on palate,  Neck: Supple, no deformities, masses, or tenderness Lungs: Tachypneic, decreased breath sounds bilaterally at lung bases. Heart: Regular rate and rhythm, S1 and S2 normal, no murmurs, rubs auscultated Abdomen: BS normoactive,soft,nondistended,non-tender to palpation,no organomegaly Extremities: Left foot in dressing Neuro : Alert and oriented to time, place and person, No focal deficits    Data Reviewed: I have personally reviewed following labs  and imaging studies  CBG: Recent Labs  Lab 04/07/18 1709 04/07/18 2056 04/08/18 0730 04/08/18 1207 04/08/18 1644  GLUCAP 200* 282* 117* 138* 143*    CBC: Recent Labs  Lab 04/02/18 1624  04/03/18 0508  04/04/18 2214 04/05/18 0541 04/05/18 1010 04/06/18 0431 04/08/18 0801  WBC 11.5*  --  11.1*  --   --  9.6  --  9.7 14.2*  HGB 9.6*   < > 9.5*   < > 9.0* 7.7* 8.0* 7.8* 7.9*  HCT 30.8*  --  29.8*  --   --  23.9*  --  24.3* 24.7*  MCV 73.3*  --  74.3*  --   --  71.8*  --  72.1* 71.2*  PLT 310  --  292  --   --  305  --  334 368   < > = values in this interval not displayed.    Basic Metabolic Panel: Recent Labs  Lab 04/02/18 0158 04/03/18 0508 04/05/18 0541 04/06/18 0431 04/08/18 0801  NA 136 136 141 138 138  K 2.9* 3.4* 3.3* 3.4* 3.8  CL 99 100 108 107 106  CO2 26 23 22 23 23   GLUCOSE 207* 88 142* 118* 123*  BUN 17 16 9 8 10   CREATININE 1.63* 1.30* 1.27* 1.16* 1.05*  CALCIUM 9.1 8.6* 8.7* 8.6* 8.9    Recent Results (from the past 240 hour(s))  Blood Cultures x 2 sites     Status: None   Collection Time: 04/02/18  4:15 PM  Result Value Ref Range Status   Specimen Description BLOOD LEFT ANTECUBITAL  Final   Special Requests   Final    BOTTLES DRAWN AEROBIC ONLY Blood Culture adequate volume   Culture   Final    NO GROWTH 5 DAYS Performed at [facility], [address], [place], [place] [address]    Report Status 04/07/2018 FINAL  Final  Blood Cultures x 2 sites     Status: None   Collection Time: 04/02/18  4:26 PM  Result Value Ref Range Status   Specimen Description BLOOD LEFT HAND  Final   Special Requests   Final    BOTTLES DRAWN AEROBIC ONLY Blood Culture adequate volume   Culture   Final    NO GROWTH 5 DAYS Performed at [facility], [place] [address], [place], [place] [address]    Report Status 04/07/2018 FINAL  Final      Cardiac Enzymes: Recent Labs  Lab 04/02/18 1014  TROPONINI <0.03      Studies: Dg Chest 1  View  Result Date: 04/08/2018 CLINICAL DATA:  Shortness of breath EXAM: CHEST  1 VIEW COMPARISON:  04/02/2018 FINDINGS: Chronic cardiomegaly. Low lung volumes with streaky opacities at the bases. No Kerley lines, effusion, or pneumothorax. IMPRESSION: 1. Low volume chest with streaky opacity favoring atelectasis. 2. Chronic cardiomegaly. Electronically Signed   By: Monte Fantasia M.D.   On: 04/08/2018 08:26  Scheduled Meds: . ARIPiprazole  15 mg Oral QHS  . cloNIDine  0.3 mg Oral TID  . collagenase   Topical Daily  . enoxaparin (LOVENOX) injection  40 mg Subcutaneous Q24H  . feeding supplement (GLUCERNA SHAKE)  237 mL Oral TID BM  . gabapentin  100 mg Oral TID  . hydrALAZINE  50 mg Oral Q8H  . hydrocortisone  25 mg Rectal Daily  . insulin aspart  0-15 Units Subcutaneous TID WC  . insulin aspart  0-5 Units Subcutaneous QHS  . insulin detemir  15 Units Subcutaneous BID  . metroNIDAZOLE  500 mg Oral Q8H  . multivitamin with minerals  1 tablet Oral Daily  . multivitamin-lutein  1 capsule Oral Daily  . pentoxifylline  400 mg Oral TID WC  . polyethylene glycol  17 g Oral BID  . sodium chloride flush  3 mL Intravenous Q12H      Time spent: 20 min  Oswald Hillock   [facility] Pager [phone]. If 7PM-7AM, please contact night-coverage at [email or web address], Office  [phone]  password [email or web address]  04/08/2018, 5:25 PM  LOS: 5 days

## 2018-04-08 NOTE — Progress Notes (Signed)
The patient's RN asked RT to assess the patient. The patient's BBS are clear with o2 sats of 99% on a 2lpm N/C. The patient is alert and oriented, eating breakfast.

## 2018-04-09 ENCOUNTER — Inpatient Hospital Stay (HOSPITAL_COMMUNITY): Payer: Medicare Other

## 2018-04-09 ENCOUNTER — Encounter (HOSPITAL_BASED_OUTPATIENT_CLINIC_OR_DEPARTMENT_OTHER): Payer: Medicaid Other

## 2018-04-09 LAB — CBC
HCT: 25.3 % — ABNORMAL LOW (ref 36.0–46.0)
Hemoglobin: 8.1 g/dL — ABNORMAL LOW (ref 12.0–15.0)
MCH: 22.8 pg — ABNORMAL LOW (ref 26.0–34.0)
MCHC: 32 g/dL (ref 30.0–36.0)
MCV: 71.1 fL — ABNORMAL LOW (ref 78.0–100.0)
Platelets: UNDETERMINED 10*3/uL (ref 150–400)
RBC: 3.56 MIL/uL — ABNORMAL LOW (ref 3.87–5.11)
RDW: 15 % (ref 11.5–15.5)
WBC: 10.8 10*3/uL — ABNORMAL HIGH (ref 4.0–10.5)

## 2018-04-09 LAB — BASIC METABOLIC PANEL
Anion gap: 9 (ref 5–15)
BUN: 8 mg/dL (ref 6–20)
CALCIUM: 8.9 mg/dL (ref 8.9–10.3)
CO2: 27 mmol/L (ref 22–32)
CREATININE: 1.07 mg/dL — AB (ref 0.44–1.00)
Chloride: 104 mmol/L (ref 98–111)
GFR calc Af Amer: >60 mL/min (ref >60)
GLUCOSE: 166 mg/dL — AB (ref 70–99)
Potassium: 3.9 mmol/L (ref 3.5–5.1)
SODIUM: 140 mmol/L (ref 135–145)

## 2018-04-09 LAB — GLUCOSE, CAPILLARY
Glucose-Capillary: 136 mg/dL — ABNORMAL HIGH (ref 70–99)
Glucose-Capillary: 169 mg/dL — ABNORMAL HIGH (ref 70–99)
Glucose-Capillary: 236 mg/dL — ABNORMAL HIGH (ref 70–99)
Glucose-Capillary: 330 mg/dL — ABNORMAL HIGH (ref 70–99)

## 2018-04-09 MED ORDER — LISINOPRIL 20 MG PO TABS
20.0000 mg | ORAL_TABLET | Freq: Every day | ORAL | Status: DC
  Administered 2018-04-09 – 2018-04-12 (×4): 20 mg via ORAL
  Filled 2018-04-09 (×4): qty 1

## 2018-04-09 MED ORDER — FUROSEMIDE 40 MG PO TABS
40.0000 mg | ORAL_TABLET | Freq: Every day | ORAL | Status: DC
  Administered 2018-04-09 – 2018-04-12 (×4): 40 mg via ORAL
  Filled 2018-04-09 (×4): qty 1

## 2018-04-09 MED ORDER — LISINOPRIL 40 MG PO TABS: 40.0000 mg | ORAL_TABLET | Freq: Every day | ORAL | Status: DC

## 2018-04-09 MED ORDER — FUROSEMIDE 40 MG PO TABS: 40.0000 mg | ORAL_TABLET | Freq: Two times a day (BID) | ORAL | Status: DC

## 2018-04-09 MED ORDER — FUROSEMIDE 20 MG PO TABS: 20.0000 mg | ORAL_TABLET | Freq: Every day | ORAL | Status: DC

## 2018-04-09 NOTE — Progress Notes (Signed)
[facility]  PROGRESS NOTE  Shifa Brisbon ONG:[id number] DOB: 1970/11/10 DOA: 04/02/2018 PCP: [name], MD   Brief HPI:   47 yr old female with  history of anxiety,  COPD diabetes mellitus came to ED chest pain and hematochezia.  She was found to have guaiac positive stool.  Hemoglobin dropped from 11.7-10.4.  GI was consulted.    S   Patient seen and examined, denies shortness of breath.  Blood pressure is elevated.   Assessment/Plan:     1. Rectal bleeding-resolved, CT showed findings of proctitis.  Hemoglobin dropped to 8.1.  Patient underwent colonoscopy today, showed polyp status post polypectomy and rectal ulcers.  Will start MiraLAX twice a day indefinitely and hydrocortisone suppository once daily for 10 days as per GI recommendation.  Starting from 04/05/2018 stop on 04/14/2018 2. Acute blood loss anemia-hemoglobin is stable at 7.9. 3. Acute diastolic CHF -patient has history of diastolic CHF, she has developed mild pulmonary edema.  She diuresed very well with 1 dose of Lasix 40 mg IV.  She was started on Lasix 20 mg IV every 12 hours.  Will start Lasix 40 mg p.o. Daily. 4. Chest pain-resolved, troponin x 2 is negative.  Blood pressure is stable 5. Diabetic foot ulcer-wound care following.  MRI of the foot showsSoft tissue wound along the dorsal aspect of the forefoot overlying fourth metatarsal head with surrounding mild cellulitis.  Subcortical marrow edema in the first metatarsal head and base of the first proximal phalanx with surrounding soft tissue edema but without definite bone destruction.  Continue ceftriaxone and Flagyl.  Ordered ABI, also consulted orthopedics Dr. Sharol Given for further recommendations.  Dr.Duda recommends no surgery, no evidence of osteomyelitis.   Continue ceftriaxone, Flagyl.  Continue Neurontin 100 mg po tid for neuropathic pain. 6. Peripheral arterial disease-patient started on Trental to improve her microcirculation in left foot.  ABI shows  excellent perfusion right lower extremity with reduced perfusion left lower extremity at the ankle 7. Hypokalemia-replete 8. Hypertension-blood pressure is elevated, will restart lisinopril at low-dose of 20 mg p.o. daily as she has also been started on Lasix as above.  Closely monitor patient blood pressure. 9. Acute kidney injury-likely in setting of GI bleed, renal function is improved to 1.16.   10. Schizoaffective disorder-continue Abilify    DVT prophylaxis: SCD  Code Status: Full code  Family Communication: No family at bedside  Disposition Plan: Skilled nursing facility.  Consultants:  GI  Procedures:  None   Antibiotics:   Anti-infectives (From admission, onward)   Start     Dose/Rate Route Frequency Ordered Stop   04/02/18 1400  metroNIDAZOLE (FLAGYL) tablet 500 mg     500 mg Oral Every 8 hours 04/02/18 1109     04/02/18 1300  cefTRIAXone (ROCEPHIN) 2 g in sodium chloride 0.9 % 100 mL IVPB     2 g 200 mL/hr over 30 Minutes Intravenous Every 24 hours 04/02/18 1109         Objective   Vitals:   04/08/18 2150 04/09/18 0432 04/09/18 1100 04/09/18 1317  BP: (!) 198/107 (!) 174/99 (!) 179/100 (!) 152/87  Pulse: 85 100 (!) 102 82  Resp: 20 (!) 24  18  Temp: 98.7 F (37.1 C) 99 F (37.2 C)  98.4 F (36.9 C)  TempSrc: Oral Oral  Oral  SpO2: 98% 96%  96%  Weight:      Height:        Intake/Output Summary (Last 24 hours) at 04/09/2018  [place] filed at 04/08/2018 1946 Gross per 24 hour  Intake -  Output 900 ml  Net -900 ml   Filed Weights   04/04/18 0544 04/04/18 1139 04/05/18 0530  Weight: 97.4 kg 97.4 kg 98.2 kg     Physical Examination:    General: Appears in no acute distress  Cardiovascular: S1-S2 regular  Respiratory: Clear to auscultation bilaterally  Abdomen: Soft, nontender, no organomegaly  Musculoskeletal: Left foot in dressing     Data Reviewed: I have personally reviewed following labs and imaging  studies  CBG: Recent Labs  Lab 04/08/18 1207 04/08/18 1644 04/08/18 2153 04/09/18 0823 04/09/18 1206  GLUCAP 138* 143* 190* 136* 169*    CBC: Recent Labs  Lab 04/03/18 0508  04/04/18 2214 04/05/18 0541 04/05/18 1010 04/06/18 0431 04/08/18 0801  WBC 11.1*  --   --  9.6  --  9.7 14.2*  HGB 9.5*   < > 9.0* 7.7* 8.0* 7.8* 7.9*  HCT 29.8*  --   --  23.9*  --  24.3* 24.7*  MCV 74.3*  --   --  71.8*  --  72.1* 71.2*  PLT 292  --   --  305  --  334 368   < > = values in this interval not displayed.    Basic Metabolic Panel: Recent Labs  Lab 04/03/18 0508 04/05/18 0541 04/06/18 0431 04/08/18 0801 04/09/18 0412  NA 136 141 138 138 140  K 3.4* 3.3* 3.4* 3.8 3.9  CL 100 108 107 106 104  CO2 23 22 23 23 27   GLUCOSE 88 142* 118* 123* 166*  BUN 16 9 8 10 8   CREATININE 1.30* 1.27* 1.16* 1.05* 1.07*  CALCIUM 8.6* 8.7* 8.6* 8.9 8.9    Recent Results (from the past 240 hour(s))  Blood Cultures x 2 sites     Status: None   Collection Time: 04/02/18  4:15 PM  Result Value Ref Range Status   Specimen Description BLOOD LEFT ANTECUBITAL  Final   Special Requests   Final    BOTTLES DRAWN AEROBIC ONLY Blood Culture adequate volume   Culture   Final    NO GROWTH 5 DAYS Performed at [facility], [address] [place], [place] [address]    Report Status 04/07/2018 FINAL  Final  Blood Cultures x 2 sites     Status: None   Collection Time: 04/02/18  4:26 PM  Result Value Ref Range Status   Specimen Description BLOOD LEFT HAND  Final   Special Requests   Final    BOTTLES DRAWN AEROBIC ONLY Blood Culture adequate volume   Culture   Final    NO GROWTH 5 DAYS Performed at [facility], [place] [address], [place], [place] [address]    Report Status 04/07/2018 FINAL  Final      Cardiac Enzymes: No results for input(s): CKTOTAL, CKMB, CKMBINDEX, TROPONINI in the last 168 hours.    Studies: Dg Chest 1 View  Result Date: 04/08/2018 CLINICAL DATA:  Shortness of  breath EXAM: CHEST  1 VIEW COMPARISON:  04/02/2018 FINDINGS: Chronic cardiomegaly. Low lung volumes with streaky opacities at the bases. No Kerley lines, effusion, or pneumothorax. IMPRESSION: 1. Low volume chest with streaky opacity favoring atelectasis. 2. Chronic cardiomegaly. Electronically Signed   By: Monte Fantasia M.D.   On: 04/08/2018 08:26    Scheduled Meds: . ARIPiprazole  15 mg Oral QHS  . cloNIDine  0.3 mg Oral TID  . collagenase  Topical Daily  . enoxaparin (LOVENOX) injection  40 mg Subcutaneous Q24H  . feeding supplement (GLUCERNA SHAKE)  237 mL Oral TID BM  . furosemide  40 mg Oral Daily  . gabapentin  100 mg Oral TID  . hydrALAZINE  50 mg Oral Q8H  . hydrocortisone  25 mg Rectal Daily  . insulin aspart  0-15 Units Subcutaneous TID WC  . insulin aspart  0-5 Units Subcutaneous QHS  . insulin detemir  15 Units Subcutaneous BID  . lisinopril  20 mg Oral Daily  . metroNIDAZOLE  500 mg Oral Q8H  . multivitamin with minerals  1 tablet Oral Daily  . multivitamin-lutein  1 capsule Oral Daily  . pentoxifylline  400 mg Oral TID WC  . polyethylene glycol  17 g Oral BID  . sodium chloride flush  3 mL Intravenous Q12H      Time spent: 20 min  Oswald Hillock   [facility] Pager [phone]. If 7PM-7AM, please contact night-coverage at [email or web address], Office  [phone]  password [email or web address]  04/09/2018, 4:32 PM  LOS: 6 days

## 2018-04-09 NOTE — Progress Notes (Signed)
Attempted to visit this patient yesterday and she was sleepy and declined the visit.  Came by today and patient was open to visit.  She commented she would like to get relief from the pain.  I prayed for her to feel better and for rest and recuperation here in the hospital and for all the staff caring for her and wisdom to address the needs she has.  Prayed also for peace of mind and heart and for healing during this time. Conard Novak, Chaplain   04/09/18 1200  Clinical Encounter Type  Visited With Patient  Visit Type Initial;Spiritual support  Referral From Nurse  Consult/Referral To Chaplain  Spiritual Encounters  Spiritual Needs Prayer  Stress Factors  Patient Stress Factors Other (Comment) (When i asked how I could pray for her she said -the pain )

## 2018-04-09 NOTE — Progress Notes (Signed)
Physical Therapy Treatment Patient Details Name: Diamond Rice MRN: [id number] DOB: December 04, 1970 Today's Date: 04/09/2018    History of Present Illness Diamond Rice is a 47 y.o. female with medical history significant of DM; CVA; OSA; HTN; HLD; and COPD presenting with rectal bleeding.     PT Comments    Patient received in recliner. Agreeable to work with PT for transfers but declines short distance ambulation in room due to L foot pain and general fatigue. Requires Min A to power up at recliner as well as Min A for RW management during transfer. PT recs remain appropriate.     Follow Up Recommendations  SNF     Equipment Recommendations  None recommended by PT    Recommendations for Other Services       Precautions / Restrictions Precautions Precautions: Fall Precaution Comments: L foot ulcer Restrictions Weight Bearing Restrictions: No    Mobility  Bed Mobility Overal bed mobility: Needs Assistance Bed Mobility: Sit to Supine       Sit to supine: Supervision   General bed mobility comments: increased time and use of bed rails  Transfers Overall transfer level: Needs assistance Equipment used: Rolling walker (2 wheeled) Transfers: Sit to/from [facility] Sit to Stand: Min assist Stand pivot transfers: Min assist;Min guard       General transfer comment: Min A to power up from recliner; Min A for RW management  Ambulation/Gait             General Gait Details: patient declining mobility in room - requesting to only tranfer even with education to promote mobility for short distances   Marine scientist [place] (Stroke Patients Only)       Balance Overall balance assessment: Needs assistance Sitting-balance support: No upper extremity supported;Feet supported Sitting balance-Leahy Scale: Good     Standing balance support: Bilateral upper extremity supported;During functional  activity Standing balance-Leahy Scale: Poor Standing balance comment: requires support to offload L LE                             Cognition Arousal/Alertness: Awake/alert;Lethargic Behavior During Therapy: WFL for tasks assessed/performed Overall Cognitive Status: Within Functional Limits for tasks assessed                                 General Comments: slightly lethargic - reports being given pain medication      Exercises      General Comments General comments (skin integrity, edema, etc.): HR up to 110 with stand pivot transfer, O2 at 95% on RA, but with patient reporting SOB; applied 2L O2 as O2 was already on in the room from prior in the day      Pertinent Vitals/Pain Pain Assessment: Faces Faces Pain Scale: Hurts little more Pain Location: L foot Pain Descriptors / Indicators: Discomfort;Sore Pain Intervention(s): Limited activity within patient's tolerance;Monitored during session    Home Living                      Prior Function            PT Goals (current goals can now be found in the care plan section) Acute Rehab PT Goals PT Goal Formulation: With patient Time For Goal Achievement: 04/18/18 Potential to Achieve  Goals: Good Progress towards PT goals: Progressing toward goals    Frequency    Min 2X/week      PT Plan Current plan remains appropriate    Co-evaluation              AM-PAC PT "6 Clicks" Daily Activity  Outcome Measure  Difficulty turning over in bed (including adjusting bedclothes, sheets and blankets)?: A Little Difficulty moving from lying on back to sitting on the side of the bed? : A Little Difficulty sitting down on and standing up from a chair with arms (e.g., wheelchair, bedside commode, etc,.)?: Unable Help needed moving to and from a bed to chair (including a wheelchair)?: A Little Help needed walking in hospital room?: Total Help needed climbing 3-5 steps with a railing? :  Total 6 Click Score: 12    End of Session Equipment Utilized During Treatment: Gait belt Activity Tolerance: Patient limited by lethargy;Patient limited by fatigue Patient left: in bed;with call bell/phone within reach;with bed alarm set Nurse Communication: Mobility status PT Visit Diagnosis: Pain;Other abnormalities of gait and mobility (R26.89);History of falling (Z91.81) Pain - Right/Left: Left Pain - part of body: Ankle and joints of foot     Time: 1356-1411 PT Time Calculation (min) (ACUTE ONLY): 15 min  Charges:  $Therapeutic Activity: 8-22 mins                     Lanney Gins, PT, DPT 04/09/18 2:51 PM Pager: [phone]

## 2018-04-10 ENCOUNTER — Encounter: Payer: Self-pay | Admitting: Gastroenterology

## 2018-04-10 DIAGNOSIS — K625 Hemorrhage of anus and rectum: Principal | ICD-10-CM

## 2018-04-10 LAB — GLUCOSE, CAPILLARY
GLUCOSE-CAPILLARY: 140 mg/dL — AB (ref 70–99)
GLUCOSE-CAPILLARY: 178 mg/dL — AB (ref 70–99)
Glucose-Capillary: 219 mg/dL — ABNORMAL HIGH (ref 70–99)
Glucose-Capillary: 223 mg/dL — ABNORMAL HIGH (ref 70–99)

## 2018-04-10 MED ORDER — AMOXICILLIN-POT CLAVULANATE 875-125 MG PO TABS
1.0000 | ORAL_TABLET | Freq: Two times a day (BID) | ORAL | Status: DC
  Administered 2018-04-10 – 2018-04-12 (×4): 1 via ORAL
  Filled 2018-04-10 (×4): qty 1

## 2018-04-10 MED ORDER — PROSIGHT PO TABS
1.0000 | ORAL_TABLET | Freq: Every day | ORAL | Status: DC
  Administered 2018-04-10 – 2018-04-12 (×3): 1 via ORAL
  Filled 2018-04-10 (×3): qty 1

## 2018-04-10 NOTE — Progress Notes (Signed)
PROGRESS NOTE    Diamond Rice  LOV:[id number] DOB: Jun 24, 1971 DOA: 04/02/2018 PCP: [name], MD   Brief Narrative: Patient is a 47 year old female with past medical history of anxiety, COPD, diabetes mellitus who presents to the emergency department with complaints of chest pain and hematochezia.  She was found to have guaiac positive stool.  Her hemoglobin was found to be dropped from baseline.  GI was consulted.  Underwent colonoscopy.  Currently she is hemodynamically stable.  She is waiting for skilled nursing facility bed.  Assessment & Plan:   Principal Problem:   GI bleeding Active Problems:   Chest pain   Hypertension   Diabetes mellitus type 2 with complications, uncontrolled (HCC)   Tobacco use   OSA (obstructive sleep apnea)   Schizoaffective disorder, bipolar type (HCC)   Acute renal failure (ARF) (HCC)   Hypokalemia   Polysubstance abuse ([place])   Diabetic foot ulcer ([place])  Rectal bleeding: Resolved.  CT showed findings of proctitis.  Hemoglobin had dropped but currently is stable.  Underwent colonoscopy which showed rectal ulcers.  Started on MiraLAX twice a day which will be continued indefinitely and also hydrocortisone suppository once a day which will be continued till 04/14/2018.  She will follow-up with GI as an outpatient on discharge.  Acute blood loss anemia: Currently H&H stable.  We will continue to monitor.  Acute diastolic CHF: History of diastolic heart failure.  She was also found to have mild pulmonary edema.  On Lasix 40 mg daily which we will continue.  Chest pain: Resolved.  Troponins negative.  Diabetic foot ulcer: Has ulcer on the dorsum of the left foot.  Wound care following.  MRI showed subcortical marrow edema in the first metatarsal head and base of the first proximal phalanx without definite bone destruction.  Orthopedics did not consider it as osteomyelitis.  Currently on IV antibiotics which we will change to oral today.   Orthopedics was following.  Continue Neurontin 100 mg 3 times a day for neuropathic pain.  Peripheral artery disease: Started on trental.  ABIs showed excellent perfusion in the right lower extremity with reduced perfusion in the left lower extremity at the ankle.  Follow-up with vascular study as an outpatient.  Hypokalemia: Supplemented  Hypertension: Currently blood pressure acceptable.  Lisinopril restarted.  Also on Lasix and hydralazine.  Acute kidney injury: Renal function improved  History of seizure affective disorder: Continue Abilify  Deconditioning/debility: Physical therapy recommended skilled nursing facility on discharge.  Social worker following.   DVT prophylaxis: SCD Code Status: Full Family Communication: None present at the bedside Disposition Plan: Skilled nursing facility as soon as the bed is available   Consultants: Orthopedics  Procedures: Colonoscopy  Antimicrobials: Augmentin  Subjective:  Patient seen and examined at the bedside this afternoon.  Looks comfortable.  Complains of some cough and shortness of breath on exertion.   Objective: Vitals:   04/09/18 2150 04/09/18 2200 04/10/18 0451 04/10/18 1322  BP: (!) 147/90  (!) 148/94 (!) 150/93  Pulse: 87  83 80  Resp: (!) 40 (!) 24 18 20   Temp: 98.7 F (37.1 C)  98.5 F (36.9 C) 98.3 F (36.8 C)  TempSrc: Oral  Oral Oral  SpO2: 97% 100% 100% 99%  Weight:      Height:        Intake/Output Summary (Last 24 hours) at 04/10/2018 1429 Last data filed at 04/10/2018 1416 Gross per 24 hour  Intake 618 ml  Output 950 ml  Net -  332 ml   Filed Weights   04/04/18 0544 04/04/18 1139 04/05/18 0530  Weight: 97.4 kg 97.4 kg 98.2 kg    Examination:  General exam: Appears calm and comfortable ,Not in distress,obese HEENT:PERRL,Oral mucosa moist, Ear/Nose normal on gross exam Respiratory system: Bilateral decreased air entry in the bases  cardiovascular system: S1 & S2 heard, RRR. No JVD, murmurs,  rubs, gallops or clicks. No pedal edema. Gastrointestinal system: Abdomen is nondistended, soft and nontender. No organomegaly or masses felt. Normal bowel sounds heard. Central nervous system: Alert and oriented. No focal neurological deficits. Extremities: No edema, no clubbing ,no cyanosis, distal peripheral pulses palpable. Ulcer on the left foot wrapped with dressing. Skin: No rashes, lesions ,no icterus ,no pallor MSK: Normal muscle bulk,tone ,power Psychiatry: Judgement and insight appear normal. Mood & affect appropriate.     Data Reviewed: I have personally reviewed following labs and imaging studies  CBC: Recent Labs  Lab 04/05/18 0541 04/05/18 1010 04/06/18 0431 04/08/18 0801 04/09/18 2248  WBC 9.6  --  9.7 14.2* 10.8*  HGB 7.7* 8.0* 7.8* 7.9* 8.1*  HCT 23.9*  --  24.3* 24.7* 25.3*  MCV 71.8*  --  72.1* 71.2* 71.1*  PLT 305  --  334 368 PLATELET CLUMPS NOTED ON SMEAR, UNABLE TO ESTIMATE   Basic Metabolic Panel: Recent Labs  Lab 04/05/18 0541 04/06/18 0431 04/08/18 0801 04/09/18 0412  NA 141 138 138 140  K 3.3* 3.4* 3.8 3.9  CL 108 107 106 104  CO2 22 23 23 27   GLUCOSE 142* 118* 123* 166*  BUN 9 8 10 8   CREATININE 1.27* 1.16* 1.05* 1.07*  CALCIUM 8.7* 8.6* 8.9 8.9   GFR: Estimated Creatinine Clearance: 71.3 mL/min (A) (by C-G formula based on SCr of 1.07 mg/dL (H)). Liver Function Tests: No results for input(s): AST, ALT, ALKPHOS, BILITOT, PROT, ALBUMIN in the last 168 hours. No results for input(s): LIPASE, AMYLASE in the last 168 hours. No results for input(s): AMMONIA in the last 168 hours. Coagulation Profile: No results for input(s): INR, PROTIME in the last 168 hours. Cardiac Enzymes: No results for input(s): CKTOTAL, CKMB, CKMBINDEX, TROPONINI in the last 168 hours. BNP (last 3 results) No results for input(s): PROBNP in the last 8760 hours. HbA1C: No results for input(s): HGBA1C in the last 72 hours. CBG: Recent Labs  Lab 04/09/18 1206  04/09/18 1658 04/09/18 2147 04/10/18 0755 04/10/18 1215  GLUCAP 169* 236* 330* 140* 178*   Lipid Profile: No results for input(s): CHOL, HDL, LDLCALC, TRIG, CHOLHDL, LDLDIRECT in the last 72 hours. Thyroid Function Tests: No results for input(s): TSH, T4TOTAL, FREET4, T3FREE, THYROIDAB in the last 72 hours. Anemia Panel: No results for input(s): VITAMINB12, FOLATE, FERRITIN, TIBC, IRON, RETICCTPCT in the last 72 hours. Sepsis Labs: No results for input(s): PROCALCITON, LATICACIDVEN in the last 168 hours.  Recent Results (from the past 240 hour(s))  Blood Cultures x 2 sites     Status: None   Collection Time: 04/02/18  4:15 PM  Result Value Ref Range Status   Specimen Description BLOOD LEFT ANTECUBITAL  Final   Special Requests   Final    BOTTLES DRAWN AEROBIC ONLY Blood Culture adequate volume   Culture   Final    NO GROWTH 5 DAYS Performed at [facility], [address] [place], [place] [address]    Report Status 04/07/2018 FINAL  Final  Blood Cultures x 2 sites     Status: None   Collection Time: 04/02/18  4:26 PM  Result Value Ref Range Status   Specimen Description BLOOD LEFT HAND  Final   Special Requests   Final    BOTTLES DRAWN AEROBIC ONLY Blood Culture adequate volume   Culture   Final    NO GROWTH 5 DAYS Performed at [facility], [address], [place], [place] [address]    Report Status 04/07/2018 FINAL  Final         Radiology Studies: Dg Chest Port 1 View  Result Date: 04/09/2018 CLINICAL DATA:  Shortness of breath EXAM: PORTABLE CHEST 1 VIEW COMPARISON:  04/08/2018 FINDINGS: Cardiac shadow is enlarged but stable. Patchy opacities are noted in the bases bilaterally stable in appearance from the prior exam. No findings to suggest congestive failure are noted. No bony abnormality is noted. IMPRESSION: Persistent bibasilar opacities. Electronically Signed   By: Inez Catalina M.D.   On: 04/09/2018 23:14        Scheduled Meds: .  ARIPiprazole  15 mg Oral QHS  . cloNIDine  0.3 mg Oral TID  . collagenase   Topical Daily  . enoxaparin (LOVENOX) injection  40 mg Subcutaneous Q24H  . feeding supplement (GLUCERNA SHAKE)  237 mL Oral TID BM  . furosemide  40 mg Oral Daily  . gabapentin  100 mg Oral TID  . hydrALAZINE  50 mg Oral Q8H  . hydrocortisone  25 mg Rectal Daily  . insulin aspart  0-15 Units Subcutaneous TID WC  . insulin aspart  0-5 Units Subcutaneous QHS  . insulin detemir  15 Units Subcutaneous BID  . lisinopril  20 mg Oral Daily  . metroNIDAZOLE  500 mg Oral Q8H  . multivitamin  1 tablet Oral Daily  . multivitamin with minerals  1 tablet Oral Daily  . pentoxifylline  400 mg Oral TID WC  . polyethylene glycol  17 g Oral BID  . sodium chloride flush  3 mL Intravenous Q12H   Continuous Infusions: . sodium chloride 20 mL/hr at 04/08/18 1500  . cefTRIAXone (ROCEPHIN)  IV 2 g (04/10/18 1235)     LOS: 7 days    Time spent:25 mins. More than 50% of that time was spent in counseling and/or coordination of care.      Shelly Coss, MD [facility] Pager [phone]  If 7PM-7AM, please contact night-coverage [email or web address] Password [facility] 04/10/2018, 2:29 PM

## 2018-04-10 NOTE — Clinical Social Work Note (Signed)
Clinical Social Work Assessment  Patient Details  Name: Diamond Rice MRN: [id number] Date of Birth: 06-26-1971  Date of referral:  04/10/18               Reason for consult:  Discharge Planning, Facility Placement                Permission sought to share information with:    Permission granted to share information::     Name::        Agency::     Relationship::     Contact Information:     Housing/Transportation Living arrangements for the past 2 months:  Apartment Source of Information:  Patient Patient Interpreter Needed:  None Criminal Activity/Legal Involvement Pertinent to Current Situation/Hospitalization:    Significant Relationships:    Lives with:  Self, Roommate Do you feel safe going back to the place where you live?  Yes Need for family participation in patient care:     Care giving concerns:  Patient voiced no concerns at this time.    Social Worker assessment / plan: CSW met with patient via bedside to discuss disposition planning. Patient has been evaluated by PT and recommended SNF placement at discharge. Patient currently agreeable to plan and has no preference for facility- however would prefer not to go to [facility] due to previously working there. Patient currently lives in an apartment with an elderly roommate who she states helps her if she can. Patient does not have any family is [place] and has some communication with family.   CSW will complete FL2 and fax information in facilities in [place]. CSW has submitted for PASRR and it is currently under manual review.   Employment status:  Disabled (Comment on whether or not currently receiving Disability) Insurance information:  Medicare PT Recommendations:  [place] / Referral to community resources:  [place]  Patient/Family's Response to care: Patient appreciated CSW.   Patient/Family's Understanding of and Emotional Response to Diagnosis, Current  Treatment, and Prognosis:  Patient understands current treatment and disposition plans.   Emotional Assessment Appearance:  Appears stated age Attitude/Demeanor/Rapport:    Affect (typically observed):  Accepting, Pleasant, Appropriate Orientation:  Oriented to Self, Oriented to Situation, Oriented to Place, Oriented to  Time Alcohol / Substance use:    Psych involvement (Current and /or in the community):  No (Comment)  Discharge Needs  Concerns to be addressed:  No discharge needs identified Readmission within the last 30 days:    Current discharge risk:  None, Other Barriers to Discharge:  No Barriers Identified   Weston Anna, LCSW 04/10/2018, 11:41 AM

## 2018-04-11 DIAGNOSIS — E118 Type 2 diabetes mellitus with unspecified complications: Secondary | ICD-10-CM

## 2018-04-11 DIAGNOSIS — E1165 Type 2 diabetes mellitus with hyperglycemia: Secondary | ICD-10-CM

## 2018-04-11 DIAGNOSIS — N179 Acute kidney failure, unspecified: Secondary | ICD-10-CM

## 2018-04-11 LAB — GLUCOSE, CAPILLARY
GLUCOSE-CAPILLARY: 171 mg/dL — AB (ref 70–99)
GLUCOSE-CAPILLARY: 153 mg/dL — AB (ref 70–99)
GLUCOSE-CAPILLARY: 198 mg/dL — AB (ref 70–99)
GLUCOSE-CAPILLARY: 231 mg/dL — AB (ref 70–99)

## 2018-04-11 LAB — BASIC METABOLIC PANEL
Anion gap: 7 (ref 5–15)
BUN: 10 mg/dL (ref 6–20)
CO2: 26 mmol/L (ref 22–32)
Calcium: 8.4 mg/dL — ABNORMAL LOW (ref 8.9–10.3)
Chloride: 105 mmol/L (ref 98–111)
Creatinine, Ser: 1.07 mg/dL — ABNORMAL HIGH (ref 0.44–1.00)
GFR calc Af Amer: >60 mL/min (ref >60)
GFR calc non Af Amer: >60 mL/min (ref >60)
Glucose, Bld: 179 mg/dL — ABNORMAL HIGH (ref 70–99)
Potassium: 3.9 mmol/L (ref 3.5–5.1)
Sodium: 138 mmol/L (ref 135–145)

## 2018-04-11 LAB — CBC WITH DIFFERENTIAL/PLATELET
Abs Immature Granulocytes: 0.1 10*3/uL (ref 0.0–0.1)
Basophils Absolute: 0.1 10*3/uL (ref 0.0–0.1)
Basophils Relative: 1 %
Eosinophils Absolute: 0.4 10*3/uL (ref 0.0–0.7)
Eosinophils Relative: 4 %
HCT: 24.2 % — ABNORMAL LOW (ref 36.0–46.0)
Hemoglobin: 7.6 g/dL — ABNORMAL LOW (ref 12.0–15.0)
Immature Granulocytes: 1 %
Lymphocytes Relative: 34 %
Lymphs Abs: 3.4 10*3/uL (ref 0.7–4.0)
MCH: 22.9 pg — ABNORMAL LOW (ref 26.0–34.0)
MCHC: 31.4 g/dL (ref 30.0–36.0)
MCV: 72.9 fL — ABNORMAL LOW (ref 78.0–100.0)
Monocytes Absolute: 1 10*3/uL (ref 0.1–1.0)
Monocytes Relative: 10 %
Neutro Abs: 5 10*3/uL (ref 1.7–7.7)
Neutrophils Relative %: 50 %
Platelets: 430 10*3/uL — ABNORMAL HIGH (ref 150–400)
RBC: 3.32 MIL/uL — ABNORMAL LOW (ref 3.87–5.11)
RDW: 15.3 % (ref 11.5–15.5)
WBC: 10 10*3/uL (ref 4.0–10.5)

## 2018-04-11 NOTE — Progress Notes (Addendum)
Patient verbalized that she can't  ambulate more than few steps. On 2L oxygen sat O2 is 100% and on RA sat O2 is 98%-99%. Will continue to monitor.

## 2018-04-11 NOTE — Progress Notes (Signed)
Physical Therapy Treatment Patient Details Name: Diamond Rice MRN: [id number] DOB: 1970-10-06 Today's Date: 04/11/2018    History of Present Illness Diamond Rice is a 47 y.o. female with medical history significant of DM; CVA; OSA; HTN; HLD; and COPD presenting with rectal bleeding.     PT Comments    Ms. Yearsley doing well - agreeable to PT session. PT speaking with nursing prior to session with request for O2 checks with mobility. Patient able to ambulate short distances in room x 2 with RW and min A with O2 sat remaining at or above 96% throughout. Patient reporting 10/10 pain following mobility with nursing notified.    Follow Up Recommendations  SNF     Equipment Recommendations  None recommended by PT    Recommendations for Other Services       Precautions / Restrictions Precautions Precautions: Fall Precaution Comments: L foot ulcer Restrictions Weight Bearing Restrictions: No    Mobility  Bed Mobility Overal bed mobility: Needs Assistance Bed Mobility: Supine to Sit     Supine to sit: HOB elevated;Min assist;Min guard     General bed mobility comments: increased time and effort  Transfers Overall transfer level: Needs assistance Equipment used: Rolling walker (2 wheeled) Transfers: Sit to/from [facility] Sit to Stand: Min assist Stand pivot transfers: Min assist       General transfer comment: Min A to power up from bedside  Ambulation/Gait Ambulation/Gait assistance: Min assist Gait Distance (Feet): (10 ft; 12 ft) Assistive device: Rolling walker (2 wheeled) Gait Pattern/deviations: Step-to pattern;Decreased weight shift to left;Decreased stance time - left;Decreased step length - right;Trunk flexed Gait velocity: decreased   General Gait Details: encouragement required to mobilize in room; short distance x 2 with patient SOB - O2 on RA at or above 96% throughout, HR up to 130 with nursing notified   Stairs              Wheelchair Mobility    Modified Rankin (Stroke Patients Only)       Balance Overall balance assessment: Needs assistance Sitting-balance support: No upper extremity supported;Feet supported Sitting balance-Leahy Scale: Good     Standing balance support: Bilateral upper extremity supported;During functional activity Standing balance-Leahy Scale: Poor Standing balance comment: requires support to offload L LE                             Cognition Arousal/Alertness: Awake/alert;Lethargic Behavior During Therapy: WFL for tasks assessed/performed Overall Cognitive Status: Within Functional Limits for tasks assessed                                        Exercises      General Comments General comments (skin integrity, edema, etc.): HR up to 130 with gait in room      Pertinent Vitals/Pain Pain Assessment: 0-10 Pain Score: 10-Worst pain ever Pain Location: L foot Pain Descriptors / Indicators: Aching;Discomfort;Grimacing;Guarding Pain Intervention(s): Limited activity within patient's tolerance;Monitored during session;Repositioned;Patient requesting pain meds-RN notified    Home Living                      Prior Function            PT Goals (current goals can now be found in the care plan section) Acute Rehab PT Goals PT Goal Formulation: With patient Time  For Goal Achievement: 04/18/18 Potential to Achieve Goals: Good Progress towards PT goals: Progressing toward goals    Frequency    Min 2X/week      PT Plan Current plan remains appropriate    Co-evaluation              AM-PAC PT "6 Clicks" Daily Activity  Outcome Measure  Difficulty turning over in bed (including adjusting bedclothes, sheets and blankets)?: A Little Difficulty moving from lying on back to sitting on the side of the bed? : A Little Difficulty sitting down on and standing up from a chair with arms (e.g., wheelchair, bedside commode,  etc,.)?: Unable Help needed moving to and from a bed to chair (including a wheelchair)?: A Little Help needed walking in hospital room?: A Lot Help needed climbing 3-5 steps with a railing? : Total 6 Click Score: 13    End of Session Equipment Utilized During Treatment: Gait belt Activity Tolerance: Patient tolerated treatment well;Patient limited by fatigue;Patient limited by pain Patient left: in chair;with call bell/phone within reach;with chair alarm set Nurse Communication: Mobility status PT Visit Diagnosis: Pain;Other abnormalities of gait and mobility (R26.89);History of falling (Z91.81) Pain - Right/Left: Left Pain - part of body: Ankle and joints of foot     Time: 2778-2423 PT Time Calculation (min) (ACUTE ONLY): 34 min  Charges:  $Gait Training: 8-22 mins $Therapeutic Activity: 8-22 mins                     Lanney Gins, PT, DPT 04/11/18 3:50 PM Pager: [phone]

## 2018-04-11 NOTE — Progress Notes (Signed)
PROGRESS NOTE    Diamond Rice  BTD:[id number] DOB: 08/09/1971 DOA: 04/02/2018 PCP: [name], MD   Brief Narrative: Patient is a 47 year old female with past medical history of anxiety, COPD, diabetes mellitus who presented to the emergency department with complaints of chest pain and hematochezia.  She was found to have guaiac positive stool.  Her hemoglobin was found to be dropped from baseline.  GI was consulted.  Underwent colonoscopy and found to have proctitis.  Currently she is hemodynamically stable.  She is waiting for skilled nursing facility bed.  Assessment & Plan:   Principal Problem:   GI bleeding Active Problems:   Chest pain   Hypertension   Diabetes mellitus type 2 with complications, uncontrolled (HCC)   Tobacco use   OSA (obstructive sleep apnea)   Schizoaffective disorder, bipolar type (HCC)   Acute renal failure (ARF) (HCC)   Hypokalemia   Polysubstance abuse ([place])   Diabetic foot ulcer ([place])  Rectal bleeding: Resolved.  CT showed findings of proctitis.  Hemoglobin had dropped but currently is stable.Hb 7.5 today.  Does not need transfusion today.  We will continue to monitor H&H. underwent colonoscopy which showed rectal ulcers.  Started on MiraLAX twice a day which will be continued indefinitely and also hydrocortisone suppository once a day which will be continued till 04/14/2018.  She will follow-up with GI as an outpatient on discharge.  Acute blood loss anemia: Currently H&H stable.  We will continue to monitor.  Acute diastolic CHF: History of diastolic heart failure.  She was also found to have mild pulmonary edema.  On Lasix 40 mg daily which we will continue.  Chest pain: Resolved.  Troponins negative.  Diabetic foot ulcer: Has ulcer on the dorsum of the left foot.  Wound care following.  MRI showed subcortical marrow edema in the first metatarsal head and base of the first proximal phalanx without definite bone destruction.  Orthopedics did  not consider it as osteomyelitis.  Antibiotics changed to oral .  Orthopedics was following.  Continue Neurontin 100 mg 3 times a day for neuropathic pain.  Wound care needs to be continued on discharge.  Peripheral artery disease: Started on trental.  ABIs showed bilateral normal right ankle brachial index.  No evidence of significant bilateral lower extremity arterial disease.  Only left toe brachial index was abnormal.  She can follow-up with vascular surgery as an outpatient.  Hypokalemia: Supplemented  Hypertension: Currently blood pressure acceptable.  Lisinopril restarted.  Also on Lasix and hydralazine.  Acute kidney injury: Renal function improved  History of seizure affective disorder: Continue Abilify  Deconditioning/debility: Physical therapy recommended skilled nursing facility on discharge.  Social worker following.   DVT prophylaxis: SCD Code Status: Full Family Communication: None present at the bedside Disposition Plan: Skilled nursing facility as soon as the bed is available   Consultants: Orthopedics  Procedures: Colonoscopy  Antimicrobials: Augmentin  Subjective:  Patient seen and examined at the bedside this afternoon.  Complains of shortness of breath on exertion.  Otherwise no new complaints  Objective: Vitals:   04/10/18 1322 04/10/18 2149 04/11/18 0519 04/11/18 1255  BP: (!) 150/93 129/85 132/80 (!) 151/95  Pulse: 80 91 82 74  Resp: 20 20 18 18   Temp: 98.3 F (36.8 C) 98 F (36.7 C) 98.5 F (36.9 C) 98.4 F (36.9 C)  TempSrc: Oral  Oral Oral  SpO2: 99% 100% 100% 99%  Weight:      Height:  Intake/Output Summary (Last 24 hours) at 04/11/2018 1350 Last data filed at 04/11/2018 1006 Gross per 24 hour  Intake 720 ml  Output -  Net 720 ml   Filed Weights   04/04/18 0544 04/04/18 1139 04/05/18 0530  Weight: 97.4 kg 97.4 kg 98.2 kg    Examination:  General exam: Appears calm and comfortable ,Not in distress,obese HEENT:PERRL,Oral  mucosa moist, Ear/Nose normal on gross exam Respiratory system: Bilateral equal air entry, normal vesicular breath sounds, no wheezes or crackles  Cardiovascular system: S1 & S2 heard, RRR. No JVD, murmurs, rubs, gallops or clicks. Gastrointestinal system: Abdomen is nondistended, soft and nontender. No organomegaly or masses felt. Normal bowel sounds heard. Central nervous system: Alert and oriented. No focal neurological deficits. Extremities: No edema, no clubbing ,no cyanosis, distal peripheral pulses palpable. Ulcer on the dorsum of the left foot Skin: No rashes, lesions or ulcers,no icterus ,no pallor MSK: Normal muscle bulk,tone ,power Psychiatry: Judgement and insight appear normal. Mood & affect appropriate.       Data Reviewed: I have personally reviewed following labs and imaging studies  CBC: Recent Labs  Lab 04/05/18 0541 04/05/18 1010 04/06/18 0431 04/08/18 0801 04/09/18 2248 04/11/18 0457  WBC 9.6  --  9.7 14.2* 10.8* 10.0  NEUTROABS  --   --   --   --   --  5.0  HGB 7.7* 8.0* 7.8* 7.9* 8.1* 7.6*  HCT 23.9*  --  24.3* 24.7* 25.3* 24.2*  MCV 71.8*  --  72.1* 71.2* 71.1* 72.9*  PLT 305  --  334 368 PLATELET CLUMPS NOTED ON SMEAR, UNABLE TO ESTIMATE 878*   Basic Metabolic Panel: Recent Labs  Lab 04/05/18 0541 04/06/18 0431 04/08/18 0801 04/09/18 0412 04/11/18 0457  NA 141 138 138 140 138  K 3.3* 3.4* 3.8 3.9 3.9  CL 108 107 106 104 105  CO2 22 23 23 27 26   GLUCOSE 142* 118* 123* 166* 179*  BUN 9 8 10 8 10   CREATININE 1.27* 1.16* 1.05* 1.07* 1.07*  CALCIUM 8.7* 8.6* 8.9 8.9 8.4*   GFR: Estimated Creatinine Clearance: 71.3 mL/min (A) (by C-G formula based on SCr of 1.07 mg/dL (H)). Liver Function Tests: No results for input(s): AST, ALT, ALKPHOS, BILITOT, PROT, ALBUMIN in the last 168 hours. No results for input(s): LIPASE, AMYLASE in the last 168 hours. No results for input(s): AMMONIA in the last 168 hours. Coagulation Profile: No results for  input(s): INR, PROTIME in the last 168 hours. Cardiac Enzymes: No results for input(s): CKTOTAL, CKMB, CKMBINDEX, TROPONINI in the last 168 hours. BNP (last 3 results) No results for input(s): PROBNP in the last 8760 hours. HbA1C: No results for input(s): HGBA1C in the last 72 hours. CBG: Recent Labs  Lab 04/10/18 1215 04/10/18 1700 04/10/18 2146 04/11/18 0751 04/11/18 1217  GLUCAP 178* 219* 223* 171* 153*   Lipid Profile: No results for input(s): CHOL, HDL, LDLCALC, TRIG, CHOLHDL, LDLDIRECT in the last 72 hours. Thyroid Function Tests: No results for input(s): TSH, T4TOTAL, FREET4, T3FREE, THYROIDAB in the last 72 hours. Anemia Panel: No results for input(s): VITAMINB12, FOLATE, FERRITIN, TIBC, IRON, RETICCTPCT in the last 72 hours. Sepsis Labs: No results for input(s): PROCALCITON, LATICACIDVEN in the last 168 hours.  Recent Results (from the past 240 hour(s))  Blood Cultures x 2 sites     Status: None   Collection Time: 04/02/18  4:15 PM  Result Value Ref Range Status   Specimen Description BLOOD LEFT ANTECUBITAL  Final   Special  Requests   Final    BOTTLES DRAWN AEROBIC ONLY Blood Culture adequate volume   Culture   Final    NO GROWTH 5 DAYS Performed at [facility], [place] [address] [place], [place] [address]    Report Status 04/07/2018 FINAL  Final  Blood Cultures x 2 sites     Status: None   Collection Time: 04/02/18  4:26 PM  Result Value Ref Range Status   Specimen Description BLOOD LEFT HAND  Final   Special Requests   Final    BOTTLES DRAWN AEROBIC ONLY Blood Culture adequate volume   Culture   Final    NO GROWTH 5 DAYS Performed at [facility], [place] [address] [place], [place] [address]    Report Status 04/07/2018 FINAL  Final         Radiology Studies: Dg Chest Port 1 View  Result Date: 04/09/2018 CLINICAL DATA:  Shortness of breath EXAM: PORTABLE CHEST 1 VIEW COMPARISON:  04/08/2018 FINDINGS: Cardiac shadow is enlarged but stable.  Patchy opacities are noted in the bases bilaterally stable in appearance from the prior exam. No findings to suggest congestive failure are noted. No bony abnormality is noted. IMPRESSION: Persistent bibasilar opacities. Electronically Signed   By: Inez Catalina M.D.   On: 04/09/2018 23:14        Scheduled Meds: . amoxicillin-clavulanate  1 tablet Oral BID WC  . ARIPiprazole  15 mg Oral QHS  . cloNIDine  0.3 mg Oral TID  . collagenase   Topical Daily  . enoxaparin (LOVENOX) injection  40 mg Subcutaneous Q24H  . feeding supplement (GLUCERNA SHAKE)  237 mL Oral TID BM  . furosemide  40 mg Oral Daily  . gabapentin  100 mg Oral TID  . hydrALAZINE  50 mg Oral Q8H  . hydrocortisone  25 mg Rectal Daily  . insulin aspart  0-15 Units Subcutaneous TID WC  . insulin aspart  0-5 Units Subcutaneous QHS  . insulin detemir  15 Units Subcutaneous BID  . lisinopril  20 mg Oral Daily  . multivitamin  1 tablet Oral Daily  . multivitamin with minerals  1 tablet Oral Daily  . pentoxifylline  400 mg Oral TID WC  . polyethylene glycol  17 g Oral BID  . sodium chloride flush  3 mL Intravenous Q12H   Continuous Infusions: . sodium chloride 20 mL/hr at 04/08/18 1500     LOS: 8 days    Time spent:25 mins. More than 50% of that time was spent in counseling and/or coordination of care.      Shelly Coss, MD [facility] Pager [phone]  If 7PM-7AM, please contact night-coverage [email or web address] Password [email or web address] 04/11/2018, 1:50 PM

## 2018-04-11 NOTE — NC FL2 (Addendum)
[place] LEVEL OF CARE SCREENING TOOL     IDENTIFICATION  [name] Birthdate: Dec 10, 1970 Sex: female Admission Date (Current Location): 04/02/2018  [facility] [place] and [place] Number:  Herbalist and Address:  [facility] [place], [place] [address], [place], [place] [address]      Provider Number: [id number]  Attending Physician Name and Address:  Shelly Coss, MD  Relative Name and Phone Number:       Current Level of Care: Hospital Recommended Level of Care: [place] Prior Approval Number:    Date Approved/Denied:   PASRR Number:   [id number]   Discharge Plan: SNF    Current Diagnoses: Patient Active Problem List   Diagnosis Date Noted  . GI bleeding 04/02/2018  . Acute renal failure (ARF) ([place]) 04/02/2018  . Hypokalemia 04/02/2018  . Polysubstance abuse ([place]) 04/02/2018  . Diabetic foot ulcer ([place]) 04/02/2018  . Cannabis use disorder, moderate, dependence ([place]) 05/19/2017  . Cocaine use disorder, moderate, dependence ([place]) 05/19/2017  . Schizoaffective disorder, bipolar type ([place]) 10/07/2015  . OSA (obstructive sleep apnea) 06/09/2015  . Vitamin D deficiency 01/06/2015  . Bilateral knee pain 01/05/2015  . Numbness and tingling in right hand 01/05/2015  . Insomnia 12/07/2014  . Right-sided lacunar infarction ([place]) 09/15/2014  . Left hemiparesis ([place]) 09/15/2014  . Dyspnea   . Back pain 09/11/2013  . Psychoactive substance-induced organic mood disorder ([place]) 05/28/2013  . Cocaine abuse with cocaine-induced mood disorder ([place]) 05/28/2013  . Cannabis dependence with cannabis-induced anxiety disorder ([place]) 05/28/2013  . Morbid obesity with BMI of 45.0-49.9, adult ([place]) 04/25/2012  . Chest pain 04/17/2012  . Hypertension 04/17/2012  . Diabetes mellitus type 2 with complications, uncontrolled ([place]) 04/17/2012  . Hyperlipidemia 04/17/2012  . Tobacco use 04/17/2012    Orientation RESPIRATION  BLADDER Height & Weight     Self, Time, Situation, Place  Normal Incontinent Weight: 216 lb 7.9 oz (98.2 kg) Height:  5\' 1"  (154.9 cm)  BEHAVIORAL SYMPTOMS/MOOD NEUROLOGICAL BOWEL NUTRITION STATUS      Continent Diet(Carb modified )  AMBULATORY STATUS COMMUNICATION OF NEEDS Skin   Limited Assist Verbally Other (Comment)(diabetic foot ulcer left- moist to dry dressing change )                       Personal Care Assistance Level of Assistance  Bathing, Feeding, Dressing Bathing Assistance: Limited assistance Feeding assistance: Independent Dressing Assistance: Limited assistance     Functional Limitations Info             SPECIAL CARE FACTORS FREQUENCY  PT (By licensed PT), OT (By licensed OT)     PT Frequency: 5 OT Frequency: 5            Contractures      Additional Factors Info  Code Status, Allergies, Insulin Sliding Scale Code Status Info: Full code  Allergies Info: MORPHINE AND RELATED            Current Medications (04/11/2018):  This is the current hospital active medication list Current Facility-Administered Medications  Medication Dose Route Frequency Provider Last Rate Last Dose  . 0.9 %  sodium chloride infusion   Intravenous Continuous Oswald Hillock, MD 20 mL/hr at 04/08/18 1500    . acetaminophen (TYLENOL) tablet 650 mg  650 mg Oral Q6H PRN Karmen Bongo, MD   650 mg at 04/11/18 1002   Or  . acetaminophen (TYLENOL) suppository 650 mg  650  mg Rectal Q6H PRN Karmen Bongo, MD      . amoxicillin-clavulanate (AUGMENTIN) 875-125 MG per tablet 1 tablet  1 tablet Oral BID WC Shelly Coss, MD   1 tablet at 04/11/18 0807  . ARIPiprazole (ABILIFY) tablet 15 mg  15 mg Oral Ivery Quale, MD   15 mg at 04/10/18 2234  . cloNIDine (CATAPRES) tablet 0.3 mg  0.3 mg Oral TID Karmen Bongo, MD   0.3 mg at 04/11/18 1001  . collagenase (SANTYL) ointment   Topical Daily Karmen Bongo, MD   1 application at 41/28/78 1003  . enoxaparin (LOVENOX)  injection 40 mg  40 mg Subcutaneous Q24H Oswald Hillock, MD   40 mg at 04/10/18 1725  . feeding supplement (GLUCERNA SHAKE) (GLUCERNA SHAKE) liquid 237 mL  237 mL Oral TID BM Oswald Hillock, MD   237 mL at 04/10/18 2238  . furosemide (LASIX) tablet 40 mg  40 mg Oral Daily Oswald Hillock, MD   40 mg at 04/11/18 1001  . gabapentin (NEURONTIN) capsule 100 mg  100 mg Oral TID Oswald Hillock, MD   100 mg at 04/11/18 1001  . hydrALAZINE (APRESOLINE) injection 5 mg  5 mg Intravenous Q4H PRN Karmen Bongo, MD   5 mg at 04/08/18 0750  . hydrALAZINE (APRESOLINE) tablet 50 mg  50 mg Oral Q8H Karmen Bongo, MD   50 mg at 04/11/18 0717  . hydrocortisone (ANUSOL-HC) suppository 25 mg  25 mg Rectal Daily Oswald Hillock, MD   25 mg at 04/10/18 1029  . insulin aspart (novoLOG) injection 0-15 Units  0-15 Units Subcutaneous TID WC Karmen Bongo, MD   3 Units at 04/11/18 [phone]  . insulin aspart (novoLOG) injection 0-5 Units  0-5 Units Subcutaneous QHS Karmen Bongo, MD   2 Units at 04/10/18 2237  . insulin detemir (LEVEMIR) injection 15 Units  15 Units Subcutaneous BID Karmen Bongo, MD   15 Units at 04/11/18 1002  . lisinopril (PRINIVIL,ZESTRIL) tablet 20 mg  20 mg Oral Daily Oswald Hillock, MD   20 mg at 04/11/18 1001  . multivitamin (PROSIGHT) tablet 1 tablet  1 tablet Oral Daily Shelly Coss, MD   1 tablet at 04/11/18 1002  . multivitamin with minerals tablet 1 tablet  1 tablet Oral Daily Oswald Hillock, MD   1 tablet at 04/11/18 1001  . ondansetron (ZOFRAN) tablet 4 mg  4 mg Oral Q6H PRN Karmen Bongo, MD       Or  . ondansetron [facility]) injection 4 mg  4 mg Intravenous Q6H PRN Karmen Bongo, MD      . oxyCODONE-acetaminophen (PERCOCET/ROXICET) 5-325 MG per tablet 1 tablet  1 tablet Oral Q6H PRN Karmen Bongo, MD   1 tablet at 04/10/18 2249  . pentoxifylline (TRENTAL) CR tablet 400 mg  400 mg Oral TID WC Newt Minion, MD   400 mg at 04/11/18 0807  . polyethylene glycol (MIRALAX / GLYCOLAX) packet 17  g  17 g Oral BID Oswald Hillock, MD   17 g at 04/09/18 1126  . sodium chloride flush (NS) 0.9 % injection 3 mL  3 mL Intravenous Q12H Karmen Bongo, MD   3 mL at 04/11/18 1003  . zolpidem (AMBIEN) tablet 5 mg  5 mg Oral QHS PRN Schorr, Rhetta Mura, NP   5 mg at 04/10/18 2235     Discharge Medications: Please see discharge summary for a list of discharge medications.  Relevant Imaging Results:  Relevant Lab Results:  Additional Information [id number]  Weston Anna, LCSW

## 2018-04-12 LAB — GLUCOSE, CAPILLARY
Glucose-Capillary: 103 mg/dL — ABNORMAL HIGH (ref 70–99)
Glucose-Capillary: 166 mg/dL — ABNORMAL HIGH (ref 70–99)

## 2018-04-12 LAB — CBC WITH DIFFERENTIAL/PLATELET
ABS IMMATURE GRANULOCYTES: 0.1 10*3/uL (ref 0.0–0.1)
BASOS PCT: 1 %
Basophils Absolute: 0.1 10*3/uL (ref 0.0–0.1)
Eosinophils Absolute: 0.4 10*3/uL (ref 0.0–0.7)
Eosinophils Relative: 4 %
HCT: 25.5 % — ABNORMAL LOW (ref 36.0–46.0)
Hemoglobin: 8.2 g/dL — ABNORMAL LOW (ref 12.0–15.0)
IMMATURE GRANULOCYTES: 1 %
LYMPHS PCT: 42 %
Lymphs Abs: 3.9 10*3/uL (ref 0.7–4.0)
MCH: 23.2 pg — ABNORMAL LOW (ref 26.0–34.0)
MCHC: 32.2 g/dL (ref 30.0–36.0)
MCV: 72.2 fL — AB (ref 78.0–100.0)
MONOS PCT: 10 %
Monocytes Absolute: 0.9 10*3/uL (ref 0.1–1.0)
NEUTROS ABS: 4 10*3/uL (ref 1.7–7.7)
Neutrophils Relative %: 42 %
PLATELETS: 477 10*3/uL — AB (ref 150–400)
RBC: 3.53 MIL/uL — ABNORMAL LOW (ref 3.87–5.11)
RDW: 15.5 % (ref 11.5–15.5)
WBC: 9.3 10*3/uL (ref 4.0–10.5)

## 2018-04-12 MED ORDER — HYDROCORTISONE ACETATE 25 MG RE SUPP: 25.0000 mg | Freq: Every day | RECTAL | 0 refills | Status: AC

## 2018-04-12 MED ORDER — PENTOXIFYLLINE ER 400 MG PO TBCR: 400.0000 mg | EXTENDED_RELEASE_TABLET | Freq: Three times a day (TID) | ORAL | 0 refills | Status: AC

## 2018-04-12 MED ORDER — AMOXICILLIN-POT CLAVULANATE 875-125 MG PO TABS: 1.0000 | ORAL_TABLET | Freq: Two times a day (BID) | ORAL | 0 refills | Status: AC

## 2018-04-12 MED ORDER — GABAPENTIN 100 MG PO CAPS: 100.0000 mg | ORAL_CAPSULE | Freq: Three times a day (TID) | ORAL | 0 refills | Status: DC

## 2018-04-12 MED ORDER — COLLAGENASE 250 UNIT/GM EX OINT: TOPICAL_OINTMENT | Freq: Every day | CUTANEOUS | 0 refills | Status: DC

## 2018-04-12 MED ORDER — POLYETHYLENE GLYCOL 3350 17 G PO PACK: 17.0000 g | PACK | Freq: Two times a day (BID) | ORAL | 0 refills | Status: AC

## 2018-04-12 MED ORDER — FUROSEMIDE 40 MG PO TABS: 40.0000 mg | ORAL_TABLET | Freq: Every day | ORAL | 0 refills | Status: DC

## 2018-04-12 MED ORDER — OXYCODONE-ACETAMINOPHEN 5-325 MG PO TABS: 1.0000 | ORAL_TABLET | Freq: Four times a day (QID) | ORAL | 0 refills | Status: DC | PRN

## 2018-04-12 MED ORDER — LISINOPRIL 20 MG PO TABS: 20.0000 mg | ORAL_TABLET | Freq: Every day | ORAL | Status: DC

## 2018-04-12 NOTE — Progress Notes (Signed)
Diamond Rice to be D/C'd to [facility] per MD order. Report called to [place] at [place]. VVS, Skin clean and dry, dressing intact to left foot.  IV catheter discontinued intact. Site without signs and symptoms of complications. Dressing and pressure applied.  An After Visit Summary was printed and given to PTAR.  Patient escorted via stretcher, and D/C to [place] via private auto.  Melonie [place]  04/12/2018 4:08 PM

## 2018-04-12 NOTE — Discharge Summary (Signed)
Physician Discharge Summary  Lillianna Sabel UXL:[id number] DOB: 03/22/1971 DOA: 04/02/2018  PCP: [name], MD  Admit date: 04/02/2018 Discharge date: 04/12/2018  Admitted From: Home Disposition:  SNF  Discharge Condition:Stable CODE STATUS:FULL Diet recommendation: Heart Healthy   Brief/Interim Summary:  Patient is a 47 year old female with past medical history of anxiety, COPD, diabetes mellitus who presented to the emergency department with complaints of chest pain and hematochezia.  She was found to have guaiac positive stool.  Her hemoglobin was found to be dropped from baseline.  GI was consulted.  Underwent colonoscopy and found to have proctitis.  Patient was also evaluated by orthopedics for the foot ulcer on the left side.  MRI did not clearly suggest osteomyelitis.  Patient was brought by physical therapy and recommended skilled nursing facility on discharge.  Currently she is hemodynamically stable.  She is stable for discharge to skilled nursing facility today.   Following problems were addressed during her hospitalization:  Rectal bleeding: Resolved.  CT showed findings of proctitis.  Hemoglobin had dropped but currently is stable.  Does not need transfusion today. Underwent colonoscopy which showed rectal ulcers.  Started on MiraLAX twice a day which will be continued indefinitely and also hydrocortisone suppository once a day which will be continued till 04/14/2018.  She will follow-up with GI as an outpatient on discharge in 2 weeks.  Acute blood loss anemia: Currently H&H stable.  We will continue to monitor.  Acute diastolic CHF: History of diastolic heart failure.  She was also found to have mild pulmonary edema.  On Lasix 40 mg daily which we will continue.  Chest pain: Resolved.  Troponins negative.  Diabetic foot ulcer: Has ulcer on the dorsum of the left foot.  Wound care following.  MRI showed subcortical marrow edema in the first metatarsal head and  base of the first proximal phalanx without definite bone destruction.  Orthopedics did not consider it as osteomyelitis.  Antibiotics changed to oral .  Orthopedics was following.  Continue Neurontin 100 mg 3 times a day for neuropathic pain.  Wound care needs to be continued on discharge.  Peripheral artery disease: Started on trental.  ABIs showed bilateral normal right ankle brachial index.  No evidence of significant bilateral lower extremity arterial disease.  Only left toe brachial index was abnormal.  She can follow-up with vascular surgery as an outpatient.  Hypokalemia: Supplemented  Hypertension: Currently blood pressure acceptable.  Lisinopril restarted.  Also on Lasix and hydralazine.  Acute kidney injury: Renal function improved  History of seizure affective disorder: Continue Abilify  Deconditioning/debility: Physical therapy recommended skilled nursing facility on discharge.  Social worker following.    Discharge Diagnoses:  Principal Problem:   GI bleeding Active Problems:   Chest pain   Hypertension   Diabetes mellitus type 2 with complications, uncontrolled (HCC)   Tobacco use   OSA (obstructive sleep apnea)   Schizoaffective disorder, bipolar type ([place])   Acute renal failure (ARF) (HCC)   Hypokalemia   Polysubstance abuse ([place])   Diabetic foot ulcer ([place])    Discharge Instructions  Discharge Instructions    Diet - low sodium heart healthy   Complete by:  As directed    Discharge instructions   Complete by:  As directed    1) Take prescribed medications as instructed. 2)Do CBC and BMP test in a week. 3)Follow up with orthopedics as an outpatient in 1 to 2 weeks.  Name and number of the provider has been attached.  Increase activity slowly   Complete by:  As directed      Allergies as of 04/12/2018      Reactions   Morphine And Related Hives      Medication List    STOP taking these medications   cephALEXin 500 MG capsule Commonly known  as:  KEFLEX     TAKE these medications   amoxicillin-clavulanate 875-125 MG tablet Commonly known as:  AUGMENTIN Take 1 tablet by mouth 2 (two) times daily with a meal for 5 days. Start taking on:  04/13/2018   ARIPiprazole 15 MG tablet Commonly known as:  ABILIFY Take 1 tablet (15 mg total) by mouth at bedtime.   cloNIDine 0.3 MG tablet Commonly known as:  CATAPRES Take 1 tablet (0.3 mg total) by mouth 3 (three) times daily.   collagenase ointment Commonly known as:  SANTYL Apply topically daily. Apply on the left foot ulcer   furosemide 40 MG tablet Commonly known as:  LASIX Take 1 tablet (40 mg total) by mouth daily. Start taking on:  04/13/2018   gabapentin 100 MG capsule Commonly known as:  NEURONTIN Take 1 capsule (100 mg total) by mouth 3 (three) times daily.   hydrALAZINE 50 MG tablet Commonly known as:  APRESOLINE Take 1 tablet (50 mg total) by mouth every 8 (eight) hours.   hydrocortisone 25 MG suppository Commonly known as:  ANUSOL-HC Place 1 suppository (25 mg total) rectally daily for 2 days. Start taking on:  04/13/2018   Insulin Detemir 100 UNIT/ML Pen Commonly known as:  LEVEMIR Inject 15 Units into the skin 2 (two) times daily.   lisinopril 20 MG tablet Commonly known as:  PRINIVIL,ZESTRIL Take 1 tablet (20 mg total) by mouth daily. For high blood pressure What changed:    medication strength  how much to take   oxyCODONE-acetaminophen 5-325 MG tablet Commonly known as:  PERCOCET/ROXICET Take 1 tablet by mouth every 6 (six) hours as needed for severe pain.   pentoxifylline 400 MG CR tablet Commonly known as:  TRENTAL Take 1 tablet (400 mg total) by mouth 3 (three) times daily with meals.   polyethylene glycol packet Commonly known as:  MIRALAX / GLYCOLAX Take 17 g by mouth 2 (two) times daily.      Follow-up Information    Newt Minion, MD Follow up in 1 week(s).   Specialty:  Orthopedic Surgery Contact information: [place] [address] [phone]        [name], MD. Schedule an appointment as soon as possible for a visit in 1 week(s).   Specialty:  Internal Medicine Contact information: [place] [address] [phone]          Allergies  Allergen Reactions  . Morphine And Related Hives    Consultations:  GI,Orthopedics   Procedures/Studies: Dg Chest 1 View  Result Date: 04/08/2018 CLINICAL DATA:  Shortness of breath EXAM: CHEST  1 VIEW COMPARISON:  04/02/2018 FINDINGS: Chronic cardiomegaly. Low lung volumes with streaky opacities at the bases. No Kerley lines, effusion, or pneumothorax. IMPRESSION: 1. Low volume chest with streaky opacity favoring atelectasis. 2. Chronic cardiomegaly. Electronically Signed   By: Monte Fantasia M.D.   On: 04/08/2018 08:26   Dg Chest 2 View  Result Date: 04/02/2018 CLINICAL DATA:  Acute onset of left upper chest pain and mild shortness of breath. EXAM: CHEST - 2 VIEW COMPARISON:  Chest radiograph performed 09/08/2017 FINDINGS: The lungs are well-aerated. Minimal  left basilar atelectasis is noted. There is no evidence of pleural effusion or pneumothorax. The heart is borderline normal in size. No acute osseous abnormalities are seen. IMPRESSION: Minimal left basilar atelectasis noted; lungs otherwise clear. Electronically Signed   By: Garald Balding M.D.   On: 04/02/2018 02:23   Ct Abdomen Pelvis W Contrast  Result Date: 04/02/2018 CLINICAL DATA:  Bloody stools in the evening.  No nausea vomiting EXAM: CT ABDOMEN AND PELVIS WITH CONTRAST TECHNIQUE: Multidetector CT imaging of the abdomen and pelvis was performed using the standard protocol following bolus administration of intravenous contrast. CONTRAST:  186mL OMNIPAQUE IOHEXOL 300 MG/ML  SOLN COMPARISON:  09/02/2013 FINDINGS: Lower chest: No acute abnormality. Distal esophageal air-fluid level. Hepatobiliary: No focal liver abnormality is seen.  No gallstones, gallbladder wall thickening, or biliary dilatation. Pancreas: Unremarkable. No pancreatic ductal dilatation or surrounding inflammatory changes. Spleen: Normal in size without focal abnormality. Adrenals/Urinary Tract: Adrenal glands are unremarkable. Kidneys are normal, without renal calculi, focal lesion, or hydronephrosis. Bladder is unremarkable. Stomach/Bowel: Stomach is within normal limits. Appendix appears normal. No significant bowel wall thickening. Mild perirectal inflammatory changes. Vascular/Lymphatic: No significant vascular findings are present. No enlarged abdominal or pelvic lymph nodes. Reproductive: 5 cm right uterine mass most consistent with a fibroid. Other: No abdominal wall hernia or abnormality. No abdominopelvic ascites. Musculoskeletal: No acute osseous abnormality. No aggressive osseous lesion. Degenerative disc disease with disc height loss at L5-S1. Facet arthropathy throughout the lumbar spine. Severe bilateral foraminal stenosis at L5-S1. IMPRESSION: 1. Mild perirectal inflammatory changes without bowel wall thickening concerning for mild proctitis. 2. Lumbar spine spondylosis. Electronically Signed   By: Kathreen Devoid   On: 04/02/2018 13:41   Mr Foot Left W Wo Contrast  Result Date: 04/02/2018 CLINICAL DATA:  Nonhealing ulcer along the top of the foot. EXAM: MRI OF THE LEFT FOREFOOT WITHOUT AND WITH CONTRAST TECHNIQUE: Multiplanar, multisequence MR imaging of the left forefoot was performed both before and after administration of intravenous contrast. CONTRAST:  70mL MULTIHANCE GADOBENATE DIMEGLUMINE 529 MG/ML IV SOLN COMPARISON:  None. FINDINGS: Bones/Joint/Cartilage Soft tissue wound along the dorsal aspect of the forefoot centered over the fourth metatarsal head. Subcortical marrow edema in the first metatarsal head and base of the first proximal phalanx with surrounding soft tissue edema. No definite bone destruction or periosteal reaction. No acute fracture or  dislocation. Normal alignment. No joint effusion. Ligaments Ligaments are suboptimally evaluated by CT. Muscles and Tendons No muscle atrophy. Visualized flexor, extensor and peroneal tendons are intact. Soft tissue No fluid collection or hematoma.  No soft tissue mass. IMPRESSION: 1. Soft tissue wound along the dorsal aspect of the forefoot overlying the fourth metatarsal head with surrounding mild cellulitis. Subcortical marrow edema in the first metatarsal head and base of the first proximal phalanx with surrounding soft tissue edema, but without definite bone destruction. Differential considerations include reactive edema secondary to adjacent inflammation versus early osteomyelitis. Electronically Signed   By: Kathreen Devoid   On: 04/02/2018 15:07   Dg Chest Port 1 View  Result Date: 04/09/2018 CLINICAL DATA:  Shortness of breath EXAM: PORTABLE CHEST 1 VIEW COMPARISON:  04/08/2018 FINDINGS: Cardiac shadow is enlarged but stable. Patchy opacities are noted in the bases bilaterally stable in appearance from the prior exam. No findings to suggest congestive failure are noted. No bony abnormality is noted. IMPRESSION: Persistent bibasilar opacities. Electronically Signed   By: Inez Catalina M.D.   On: 04/09/2018 23:14   Dg Foot Complete Left  Result  Date: 03/17/2018 CLINICAL DATA:  Progressive pain and swelling associated with a soft tissue ulcer in the dorsum of the left foot at the 4th metatarsal head due to a fall 1 month ago. EXAM: LEFT FOOT - COMPLETE 3+ VIEW COMPARISON:  02/18/2018. FINDINGS: Interval visualization of the soft tissue ulceration in the dorsum of the distal foot on the lateral view. No associated soft tissue gas or underlying bone destruction or periosteal reaction. No fracture or dislocation. Large inferior calcaneal spur. IMPRESSION: Dorsal soft tissue ulceration in the distal foot without evidence of underlying osteomyelitis. Electronically Signed   By: Claudie Revering M.D.   On:  03/17/2018 07:57       Subjective: Patient seen and examined the bedside this morning.  Hemodynamically stable.  Complains of mild shortness of breath.  Stable for discharge to skilled nursing facility today  Discharge Exam: Vitals:   04/12/18 0506 04/12/18 0834  BP: (!) 146/96 (!) 155/87  Pulse: 78   Resp: 19   Temp: 98.4 F (36.9 C)   SpO2: 96%    Vitals:   04/11/18 1255 04/11/18 2122 04/12/18 0506 04/12/18 0834  BP: (!) 151/95 (!) 140/94 (!) 146/96 (!) 155/87  Pulse: 74 82 78   Resp: 18 20 19    Temp: 98.4 F (36.9 C) 98.3 F (36.8 C) 98.4 F (36.9 C)   TempSrc: Oral     SpO2: 99% 94% 96%   Weight:      Height:        General: Pt is alert, awake, not in acute distress Cardiovascular: RRR, S1/S2 +, no rubs, no gallops Respiratory: Decreased air entry in bilateral bases, no wheezing, no rhonchi Abdominal: Soft, NT, ND, bowel sounds + Extremities: trace edema, no cyanosis    The results of significant diagnostics from this hospitalization (including imaging, microbiology, ancillary and laboratory) are listed below for reference.     Microbiology: Recent Results (from the past 240 hour(s))  Blood Cultures x 2 sites     Status: None   Collection Time: 04/02/18  4:15 PM  Result Value Ref Range Status   Specimen Description BLOOD LEFT ANTECUBITAL  Final   Special Requests   Final    BOTTLES DRAWN AEROBIC ONLY Blood Culture adequate volume   Culture   Final    NO GROWTH 5 DAYS Performed at [facility], [address] [place], [place] [address]    Report Status 04/07/2018 FINAL  Final  Blood Cultures x 2 sites     Status: None   Collection Time: 04/02/18  4:26 PM  Result Value Ref Range Status   Specimen Description BLOOD LEFT HAND  Final   Special Requests   Final    BOTTLES DRAWN AEROBIC ONLY Blood Culture adequate volume   Culture   Final    NO GROWTH 5 DAYS Performed at [facility], [place] [address] [place], [place] [address]    Report  Status 04/07/2018 FINAL  Final     Labs: BNP (last 3 results) No results for input(s): BNP in the last 8760 hours. Basic Metabolic Panel: Recent Labs  Lab 04/06/18 0431 04/08/18 0801 04/09/18 0412 04/11/18 0457  NA 138 138 140 138  K 3.4* 3.8 3.9 3.9  CL 107 106 104 105  CO2 23 23 27 26   GLUCOSE 118* 123* 166* 179*  BUN 8 10 8 10   CREATININE 1.16* 1.05* 1.07* 1.07*  CALCIUM 8.6* 8.9 8.9 8.4*   Liver Function Tests: No results for input(s):  AST, ALT, ALKPHOS, BILITOT, PROT, ALBUMIN in the last 168 hours. No results for input(s): LIPASE, AMYLASE in the last 168 hours. No results for input(s): AMMONIA in the last 168 hours. CBC: Recent Labs  Lab 04/06/18 0431 04/08/18 0801 04/09/18 2248 04/11/18 0457 04/12/18 0532  WBC 9.7 14.2* 10.8* 10.0 9.3  NEUTROABS  --   --   --  5.0 4.0  HGB 7.8* 7.9* 8.1* 7.6* 8.2*  HCT 24.3* 24.7* 25.3* 24.2* 25.5*  MCV 72.1* 71.2* 71.1* 72.9* 72.2*  PLT 334 368 PLATELET CLUMPS NOTED ON SMEAR, UNABLE TO ESTIMATE 430* 477*   Cardiac Enzymes: No results for input(s): CKTOTAL, CKMB, CKMBINDEX, TROPONINI in the last 168 hours. BNP: Invalid input(s): POCBNP CBG: Recent Labs  Lab 04/11/18 0751 04/11/18 1217 04/11/18 1656 04/11/18 2119 04/12/18 0810  GLUCAP 171* 153* 198* 231* 103*   D-Dimer No results for input(s): DDIMER in the last 72 hours. Hgb A1c No results for input(s): HGBA1C in the last 72 hours. Lipid Profile No results for input(s): CHOL, HDL, LDLCALC, TRIG, CHOLHDL, LDLDIRECT in the last 72 hours. Thyroid function studies No results for input(s): TSH, T4TOTAL, T3FREE, THYROIDAB in the last 72 hours.  Invalid input(s): FREET3 Anemia work up No results for input(s): VITAMINB12, FOLATE, FERRITIN, TIBC, IRON, RETICCTPCT in the last 72 hours. Urinalysis    Component Value Date/Time   COLORURINE YELLOW 01/13/2018 2009   APPEARANCEUR CLEAR 01/13/2018 2009   LABSPEC 1.021 01/13/2018 2009   PHURINE 5.0 01/13/2018 2009    GLUCOSEU >=500 (A) 01/13/2018 2009   HGBUR NEGATIVE 01/13/2018 2009   [place] NEGATIVE 01/13/2018 2009   [place] NEGATIVE 01/13/2018 2009   PROTEINUR 30 (A) 01/13/2018 2009   UROBILINOGEN 0.2 08/28/2014 1143   NITRITE NEGATIVE 01/13/2018 2009   LEUKOCYTESUR NEGATIVE 01/13/2018 2009   Sepsis Labs Invalid input(s): PROCALCITONIN,  WBC,  LACTICIDVEN Microbiology Recent Results (from the past 240 hour(s))  Blood Cultures x 2 sites     Status: None   Collection Time: 04/02/18  4:15 PM  Result Value Ref Range Status   Specimen Description BLOOD LEFT ANTECUBITAL  Final   Special Requests   Final    BOTTLES DRAWN AEROBIC ONLY Blood Culture adequate volume   Culture   Final    NO GROWTH 5 DAYS Performed at [facility], [place] [address] [place], [place] [address]    Report Status 04/07/2018 FINAL  Final  Blood Cultures x 2 sites     Status: None   Collection Time: 04/02/18  4:26 PM  Result Value Ref Range Status   Specimen Description BLOOD LEFT HAND  Final   Special Requests   Final    BOTTLES DRAWN AEROBIC ONLY Blood Culture adequate volume   Culture   Final    NO GROWTH 5 DAYS Performed at [facility], [place] [address], [place], [place] [address]    Report Status 04/07/2018 FINAL  Final    Please note: You were cared for by a hospitalist during your hospital stay. Once you are discharged, your primary care physician will handle any further medical issues. Please note that NO REFILLS for any discharge medications will be authorized once you are discharged, as it is imperative that you return to your primary care physician (or establish a relationship with a primary care physician if you do not have one) for your post hospital discharge needs so that they can reassess your need for medications and monitor your lab values.    Time coordinating  discharge: 40 minutes  SIGNED:   Shelly Coss, MD  [facility] 04/12/2018, 11:17 AM Pager [phone]  If  7PM-7AM, please contact night-coverage [email or web address] Password [email or web address]

## 2018-04-12 NOTE — Progress Notes (Signed)
Patient is set to discharge to [facility] today. Patient aware. Discharge packet given to RN, Elisa. [facility] called for transport.   Kingsley Spittle, [place] Clinical Social Worker [phone]

## 2018-04-12 NOTE — Clinical Social Work Placement (Signed)
   CLINICAL SOCIAL WORK PLACEMENT  NOTE  Date:  04/12/2018  Patient Details  Name: Diamond Rice MRN: [id number] Date of Birth: February 23, 1971  Clinical Social Work is seeking post-discharge placement for this patient at the [place] level of care (*CSW will initial, date and re-position this form in  chart as items are completed):  Yes   Patient/family provided with [place] Work Department's list of facilities offering this level of care within the geographic area requested by the patient (or if unable, by the patient's family).  Yes   Patient/family informed of their freedom to choose among providers that offer the needed level of care, that participate in Medicare, Medicaid or managed care program needed by the patient, have an available bed and are willing to accept the patient.  Yes   Patient/family informed of [facility]'s ownership interest in [facility] and [facility], as well as of the fact that they are under no obligation to receive care at these facilities.  PASRR submitted to EDS on       PASRR number received on       Existing PASRR number confirmed on       FL2 transmitted to all facilities in geographic area requested by pt/family on       FL2 transmitted to all facilities within larger geographic area on       Patient informed that his/her managed care company has contracts with or will negotiate with certain facilities, including the following:        Yes   Patient/family informed of bed offers received.  Patient chooses bed at [facility]     Physician recommends and patient chooses bed at      Patient to be transferred to [place] on 04/12/18.  Patient to be transferred to facility by [facility]     Patient family notified on 04/12/18 of transfer.  Name of family member notified:        PHYSICIAN       Additional Comment:    _______________________________________________ Weston Anna, LCSW 04/12/2018,  2:34 PM

## 2018-04-16 ENCOUNTER — Ambulatory Visit: Payer: Medicare Other | Admitting: Sports Medicine

## 2018-05-07 ENCOUNTER — Other Ambulatory Visit (HOSPITAL_COMMUNITY)
Admission: RE | Admit: 2018-05-07 | Discharge: 2018-05-07 | Disposition: A | Discharge status: Home / Self Care | Payer: Medicare Other | Source: Other Acute Inpatient Hospital | Attending: Internal Medicine | Admitting: Internal Medicine

## 2018-05-07 ENCOUNTER — Encounter (HOSPITAL_BASED_OUTPATIENT_CLINIC_OR_DEPARTMENT_OTHER): Payer: Medicare Other | Attending: Internal Medicine

## 2018-05-07 DIAGNOSIS — E1140 Type 2 diabetes mellitus with diabetic neuropathy, unspecified: Secondary | ICD-10-CM | POA: Diagnosis not present

## 2018-05-07 DIAGNOSIS — L97523 Non-pressure chronic ulcer of other part of left foot with necrosis of muscle: Secondary | ICD-10-CM | POA: Insufficient documentation

## 2018-05-07 DIAGNOSIS — I509 Heart failure, unspecified: Secondary | ICD-10-CM | POA: Insufficient documentation

## 2018-05-07 DIAGNOSIS — E1151 Type 2 diabetes mellitus with diabetic peripheral angiopathy without gangrene: Secondary | ICD-10-CM | POA: Insufficient documentation

## 2018-05-07 DIAGNOSIS — I110 Hypertensive heart disease with heart failure: Secondary | ICD-10-CM | POA: Diagnosis not present

## 2018-05-07 DIAGNOSIS — J449 Chronic obstructive pulmonary disease, unspecified: Secondary | ICD-10-CM | POA: Diagnosis not present

## 2018-05-07 DIAGNOSIS — E11621 Type 2 diabetes mellitus with foot ulcer: Secondary | ICD-10-CM | POA: Diagnosis present

## 2018-05-07 DIAGNOSIS — B9620 Unspecified Escherichia coli [E. coli] as the cause of diseases classified elsewhere: Secondary | ICD-10-CM | POA: Insufficient documentation

## 2018-05-07 DIAGNOSIS — F17210 Nicotine dependence, cigarettes, uncomplicated: Secondary | ICD-10-CM | POA: Diagnosis not present

## 2018-05-07 DIAGNOSIS — B952 Enterococcus as the cause of diseases classified elsewhere: Secondary | ICD-10-CM | POA: Insufficient documentation

## 2018-05-07 DIAGNOSIS — Z794 Long term (current) use of insulin: Secondary | ICD-10-CM | POA: Diagnosis not present

## 2018-05-07 DIAGNOSIS — L03116 Cellulitis of left lower limb: Secondary | ICD-10-CM | POA: Insufficient documentation

## 2018-05-07 DIAGNOSIS — G4730 Sleep apnea, unspecified: Secondary | ICD-10-CM | POA: Diagnosis not present

## 2018-05-10 DIAGNOSIS — E11621 Type 2 diabetes mellitus with foot ulcer: Secondary | ICD-10-CM | POA: Diagnosis not present

## 2018-05-11 LAB — AEROBIC CULTURE  (SUPERFICIAL SPECIMEN)

## 2018-05-11 LAB — AEROBIC CULTURE W GRAM STAIN (SUPERFICIAL SPECIMEN)

## 2018-05-13 ENCOUNTER — Ambulatory Visit (HOSPITAL_COMMUNITY)
Admission: RE | Admit: 2018-05-13 | Discharge: 2018-05-13 | Disposition: A | Discharge status: Home / Self Care | Payer: Medicare Other | Source: Ambulatory Visit | Attending: Internal Medicine | Admitting: Internal Medicine

## 2018-05-13 ENCOUNTER — Other Ambulatory Visit (HOSPITAL_BASED_OUTPATIENT_CLINIC_OR_DEPARTMENT_OTHER): Payer: Self-pay | Admitting: Internal Medicine

## 2018-05-13 DIAGNOSIS — M898X7 Other specified disorders of bone, ankle and foot: Secondary | ICD-10-CM | POA: Insufficient documentation

## 2018-05-13 DIAGNOSIS — L97523 Non-pressure chronic ulcer of other part of left foot with necrosis of muscle: Secondary | ICD-10-CM | POA: Diagnosis not present

## 2018-05-15 ENCOUNTER — Encounter: Payer: Self-pay | Admitting: Vascular Surgery

## 2018-05-16 ENCOUNTER — Ambulatory Visit (INDEPENDENT_AMBULATORY_CARE_PROVIDER_SITE_OTHER): Payer: Medicare Other | Admitting: Podiatry

## 2018-05-16 ENCOUNTER — Encounter: Payer: Self-pay | Admitting: Podiatry

## 2018-05-16 VITALS — BP 145/99 | HR 89

## 2018-05-16 DIAGNOSIS — I739 Peripheral vascular disease, unspecified: Secondary | ICD-10-CM | POA: Diagnosis not present

## 2018-05-16 DIAGNOSIS — R0989 Other specified symptoms and signs involving the circulatory and respiratory systems: Secondary | ICD-10-CM | POA: Diagnosis not present

## 2018-05-16 DIAGNOSIS — F1910 Other psychoactive substance abuse, uncomplicated: Secondary | ICD-10-CM

## 2018-05-16 DIAGNOSIS — E11621 Type 2 diabetes mellitus with foot ulcer: Secondary | ICD-10-CM

## 2018-05-16 DIAGNOSIS — M86672 Other chronic osteomyelitis, left ankle and foot: Secondary | ICD-10-CM | POA: Diagnosis not present

## 2018-05-16 DIAGNOSIS — M8580 Other specified disorders of bone density and structure, unspecified site: Secondary | ICD-10-CM

## 2018-05-16 DIAGNOSIS — F250 Schizoaffective disorder, bipolar type: Secondary | ICD-10-CM

## 2018-05-16 DIAGNOSIS — L97423 Non-pressure chronic ulcer of left heel and midfoot with necrosis of muscle: Secondary | ICD-10-CM

## 2018-05-16 NOTE — Progress Notes (Signed)
Subjective:  Patient ID: Diamond Rice, female    DOB: 11-05-1970,  MRN: [id number]  Chief Complaint  Patient presents with  . Foot Ulcer    left foot dorsal top of toes 2-4; pt stated, "started back in June, started out as a little sore then got infected; went to [facility], had X-rays done and was told had infection in bone"    47 y.o. female presents for wound care.  Reports an ulcer to the top of the left foot.  States that it started in June as a blister and then it got infected.  Was being seen by the wound care center.  X-rays were taken on 916 which showed signs of osteomyelitis is referred for evaluation.  Review of Systems: Negative except as noted in the HPI. Denies N/V/F/Ch.  Past Medical History:  Diagnosis Date  . Anginal pain ([place])   . Anxiety   . Chronic lower back pain 04/17/2012   "just got over some; catched when I walked"  . COPD (chronic obstructive pulmonary disease) ([place])   . Depression   . Headache(784.0) 04/17/2012   "~ qod; lately waking up in am w/one"  . Heart attack ([place])   . High cholesterol   . Hypertension   . Migraines 04/17/2012  . Obesity   . Shortness of breath 04/17/2012   "all the time"  . Sleep apnea    scheduled to get her CPAP machine this week  . Stroke ([place])   . Type II diabetes mellitus ([place]) 08/28/2002    Current Outpatient Medications:  .  ARIPiprazole (ABILIFY) 15 MG tablet, Take 1 tablet (15 mg total) by mouth at bedtime., Disp: 30 tablet, Rfl: 0 .  cloNIDine (CATAPRES) 0.3 MG tablet, Take 1 tablet (0.3 mg total) by mouth 3 (three) times daily., Disp: 90 tablet, Rfl: 0 .  collagenase (SANTYL) ointment, Apply topically daily. Apply on the left foot ulcer, Disp: 15 g, Rfl: 0 .  hydrALAZINE (APRESOLINE) 50 MG tablet, Take 1 tablet (50 mg total) by mouth every 8 (eight) hours., Disp: 90 tablet, Rfl: 0 .  Insulin Detemir (LEVEMIR) 100 UNIT/ML Pen, Inject 15 Units into the skin 2 (two) times daily. , Disp: , Rfl:  .  furosemide (LASIX)  40 MG tablet, Take 1 tablet (40 mg total) by mouth daily., Disp: 30 tablet, Rfl: 0 .  gabapentin (NEURONTIN) 100 MG capsule, Take 1 capsule (100 mg total) by mouth 3 (three) times daily., Disp: 90 capsule, Rfl: 0 .  lisinopril (PRINIVIL,ZESTRIL) 20 MG tablet, Take 1 tablet (20 mg total) by mouth daily. For high blood pressure, Disp: , Rfl:  .  oxyCODONE-acetaminophen (PERCOCET/ROXICET) 5-325 MG tablet, Take 1 tablet by mouth every 6 (six) hours as needed for severe pain. (Patient not taking: Reported on 05/16/2018), Disp: 10 tablet, Rfl: 0  Social History   Tobacco Use  Smoking Status Current Every Day Smoker  . Packs/day: 0.25  . Years: 28.00  . Pack years: 7.00  . Types: Cigarettes  Smokeless Tobacco Never Used    Allergies  Allergen Reactions  . Morphine And Related Hives  . Metformin Other (See Comments)   Objective:   Vitals:   05/16/18 1422  BP: (!) 145/99  Pulse: 89   There is no height or weight on file to calculate BMI. Constitutional Well developed. Well nourished.  Vascular Dorsalis pedis pulses palpable bilaterally. Posterior tibial pulses palpable bilaterally. Capillary refill normal to all digits.  No cyanosis or clubbing noted. Pedal hair growth  normal.  Neurologic Normal speech. Oriented to person, place, and time. Protective sensation absent  Dermatologic Wound Location: Left forefoot Wound Base: Mixed Granular/Fibrotic Peri-wound: Normal with rolled borders Exudate: Moderate amount Serosanguinous exudate Wound Measurements: -4 x 4 x 1 with visible tendon   Orthopedic: No pain to palpation either foot.   Radiographs: None taken today. Assessment:   1. Diminished pulse   2. Diabetic ulcer of left midfoot associated with type 2 diabetes mellitus, with necrosis of muscle ([place])   3. PAD (peripheral artery disease) ([place])   4. Chronic osteomyelitis involving left ankle and foot ([place])   5. Bone erosion determined by x-ray   6. Obesity, morbid, BMI  40.0-49.9 ([place])   7. Polysubstance abuse ([place])   8. Schizoaffective disorder, bipolar type [facility])    Plan:  Patient was evaluated and treated and all questions answered.  Ulcer left dorsal foot -Prior x-rays reviewed indicative of osteomyelitis. -In office ABIs performed suggestive of PAD -Will order formal noninvasive vascular studies.  Pending results will refer patient for vascular intervention. -Discussed possible plan of care with patient.  I worry that the location could be difficult area for simple resection.  Amputation likely to be delayed healing from peripheral arterial disease.  Though the x-ray does show signs of osteomyelitis the wound does appear quite healthy today with mostly granular base although it does go down to tendon.  Would consider possible resection without complete amputation with possible IV antibiotics and wound VAC to close the wound.  Plan likely to change depending upon biopsy results -Dressed with Prisma DSD.  Patient is established with [facility] at home.  Continue dressing changes. -Patient has multiple medical comorbidities indicative of poor healing potential.  No recent A1c in epic -Continue off-loading with surgical shoe.  Return in about 1 week (around 05/23/2018) for Wound Care, Left.

## 2018-05-22 ENCOUNTER — Encounter (HOSPITAL_COMMUNITY): Payer: Self-pay | Admitting: Emergency Medicine

## 2018-05-22 ENCOUNTER — Emergency Department (HOSPITAL_COMMUNITY)
Admission: EM | Admit: 2018-05-22 | Discharge: 2018-05-22 | Disposition: A | Discharge status: Home / Self Care | Payer: Medicare Other | Attending: Emergency Medicine | Admitting: Emergency Medicine

## 2018-05-22 ENCOUNTER — Other Ambulatory Visit: Payer: Self-pay

## 2018-05-22 ENCOUNTER — Emergency Department (HOSPITAL_COMMUNITY): Payer: Medicare Other

## 2018-05-22 DIAGNOSIS — J449 Chronic obstructive pulmonary disease, unspecified: Secondary | ICD-10-CM | POA: Diagnosis not present

## 2018-05-22 DIAGNOSIS — I10 Essential (primary) hypertension: Secondary | ICD-10-CM | POA: Insufficient documentation

## 2018-05-22 DIAGNOSIS — E876 Hypokalemia: Secondary | ICD-10-CM | POA: Diagnosis not present

## 2018-05-22 DIAGNOSIS — K5900 Constipation, unspecified: Secondary | ICD-10-CM | POA: Diagnosis not present

## 2018-05-22 DIAGNOSIS — R109 Unspecified abdominal pain: Secondary | ICD-10-CM

## 2018-05-22 DIAGNOSIS — D259 Leiomyoma of uterus, unspecified: Secondary | ICD-10-CM

## 2018-05-22 DIAGNOSIS — F17210 Nicotine dependence, cigarettes, uncomplicated: Secondary | ICD-10-CM | POA: Insufficient documentation

## 2018-05-22 DIAGNOSIS — E11621 Type 2 diabetes mellitus with foot ulcer: Secondary | ICD-10-CM | POA: Diagnosis not present

## 2018-05-22 DIAGNOSIS — R112 Nausea with vomiting, unspecified: Secondary | ICD-10-CM | POA: Diagnosis present

## 2018-05-22 DIAGNOSIS — Z8673 Personal history of transient ischemic attack (TIA), and cerebral infarction without residual deficits: Secondary | ICD-10-CM | POA: Insufficient documentation

## 2018-05-22 LAB — COMPREHENSIVE METABOLIC PANEL
ALK PHOS: 51 U/L (ref 38–126)
ALT: 9 U/L (ref 0–44)
ANION GAP: 13 (ref 5–15)
AST: 13 U/L — AB (ref 15–41)
Albumin: 3.7 g/dL (ref 3.5–5.0)
BILIRUBIN TOTAL: 0.7 mg/dL (ref 0.3–1.2)
BUN: 15 mg/dL (ref 6–20)
CO2: 29 mmol/L (ref 22–32)
Calcium: 9.6 mg/dL (ref 8.9–10.3)
Chloride: 99 mmol/L (ref 98–111)
Creatinine, Ser: 1.61 mg/dL — ABNORMAL HIGH (ref 0.44–1.00)
GFR calc Af Amer: 43 mL/min — ABNORMAL LOW (ref >60)
GFR calc non Af Amer: 37 mL/min — ABNORMAL LOW (ref >60)
GLUCOSE: 181 mg/dL — AB (ref 70–99)
POTASSIUM: 2.7 mmol/L — AB (ref 3.5–5.1)
Sodium: 141 mmol/L (ref 135–145)
TOTAL PROTEIN: 7.8 g/dL (ref 6.5–8.1)

## 2018-05-22 LAB — URINALYSIS, ROUTINE W REFLEX MICROSCOPIC
BACTERIA UA: NONE SEEN
BILIRUBIN URINE: NEGATIVE
Glucose, UA: 50 mg/dL — AB
HGB URINE DIPSTICK: NEGATIVE
Ketones, ur: 5 mg/dL — AB
LEUKOCYTES UA: NEGATIVE
NITRITE: NEGATIVE
PROTEIN: 30 mg/dL — AB
Specific Gravity, Urine: 1.021 (ref 1.005–1.030)
pH: 6 (ref 5.0–8.0)

## 2018-05-22 LAB — BASIC METABOLIC PANEL
Anion gap: 12 (ref 5–15)
BUN: 15 mg/dL (ref 6–20)
CHLORIDE: 98 mmol/L (ref 98–111)
CO2: 27 mmol/L (ref 22–32)
Calcium: 9.4 mg/dL (ref 8.9–10.3)
Creatinine, Ser: 1.55 mg/dL — ABNORMAL HIGH (ref 0.44–1.00)
GFR calc Af Amer: 45 mL/min — ABNORMAL LOW (ref >60)
GFR calc non Af Amer: 39 mL/min — ABNORMAL LOW (ref >60)
Glucose, Bld: 133 mg/dL — ABNORMAL HIGH (ref 70–99)
Potassium: 3.1 mmol/L — ABNORMAL LOW (ref 3.5–5.1)
Sodium: 137 mmol/L (ref 135–145)

## 2018-05-22 LAB — CBC
HEMATOCRIT: 33.6 % — AB (ref 36.0–46.0)
HEMOGLOBIN: 10.9 g/dL — AB (ref 12.0–15.0)
MCH: 22.6 pg — ABNORMAL LOW (ref 26.0–34.0)
MCHC: 32.4 g/dL (ref 30.0–36.0)
MCV: 69.6 fL — ABNORMAL LOW (ref 78.0–100.0)
Platelets: 387 10*3/uL (ref 150–400)
RBC: 4.83 MIL/uL (ref 3.87–5.11)
RDW: 15.5 % (ref 11.5–15.5)
WBC: 9.2 10*3/uL (ref 4.0–10.5)

## 2018-05-22 LAB — MAGNESIUM: Magnesium: 2 mg/dL (ref 1.7–2.4)

## 2018-05-22 LAB — LIPASE, BLOOD: LIPASE: 36 U/L (ref 11–51)

## 2018-05-22 LAB — I-STAT BETA HCG BLOOD, ED (MC, WL, AP ONLY)

## 2018-05-22 MED ORDER — GLYCERIN (LAXATIVE) 2.1 G RE SUPP
1.0000 | Freq: Once | RECTAL | Status: AC
  Administered 2018-05-22: 1 via RECTAL
  Filled 2018-05-22: qty 1

## 2018-05-22 MED ORDER — POTASSIUM CHLORIDE CRYS ER 20 MEQ PO TBCR
40.0000 meq | EXTENDED_RELEASE_TABLET | Freq: Once | ORAL | Status: AC
  Administered 2018-05-22: 40 meq via ORAL
  Filled 2018-05-22: qty 2

## 2018-05-22 MED ORDER — POTASSIUM CHLORIDE 10 MEQ/100ML IV SOLN
10.0000 meq | INTRAVENOUS | Status: AC
  Administered 2018-05-22 (×3): 10 meq via INTRAVENOUS
  Filled 2018-05-22 (×3): qty 100

## 2018-05-22 MED ORDER — IOHEXOL 300 MG/ML  SOLN
75.0000 mL | Freq: Once | INTRAMUSCULAR | Status: AC | PRN
  Administered 2018-05-22: 75 mL via INTRAVENOUS

## 2018-05-22 MED ORDER — ONDANSETRON HCL 4 MG/2ML IJ SOLN
4.0000 mg | Freq: Once | INTRAMUSCULAR | Status: AC
  Administered 2018-05-22: 4 mg via INTRAVENOUS
  Filled 2018-05-22: qty 2

## 2018-05-22 MED ORDER — SODIUM CHLORIDE 0.9 % IV SOLN
INTRAVENOUS | Status: DC | PRN
  Administered 2018-05-22: 500 mL via INTRAVENOUS

## 2018-05-22 MED ORDER — SODIUM CHLORIDE 0.9 % IJ SOLN
INTRAMUSCULAR | Status: AC
  Filled 2018-05-22: qty 50

## 2018-05-22 MED ORDER — MAGNESIUM SULFATE IN D5W 1-5 GM/100ML-% IV SOLN
1.0000 g | Freq: Once | INTRAVENOUS | Status: AC
  Administered 2018-05-22: 1 g via INTRAVENOUS
  Filled 2018-05-22: qty 100

## 2018-05-22 MED ORDER — MAGNESIUM SULFATE 50 % IJ SOLN: 1.0000 g | Freq: Once | INTRAMUSCULAR | Status: DC

## 2018-05-22 NOTE — ED Notes (Signed)
Pt made aware that urine collection is needed. States she "can't go right now"

## 2018-05-22 NOTE — ED Notes (Signed)
Date and time results received: 05/22/18 1:58 PM   Test: potassium Critical Value: 2.7  Name of Provider Notified: Schlossman  Orders Received? Or Actions Taken?: verbalizes will place orders

## 2018-05-22 NOTE — ED Notes (Signed)
Triage aware to move pt to next available room.

## 2018-05-22 NOTE — ED Triage Notes (Signed)
Pt complaint of constipation and vomiting for a week; abdominal pain all over.

## 2018-05-22 NOTE — ED Provider Notes (Signed)
Assumed care of patient at shift change from [facility], PA-C, see her note for complete history and physical. N/V abdominal pain and constipation x 1 week. QT prolongation on EKG with hypokalemia, IV and PO K given. Awaiting CT abdomen/pelvis and UA. Plan is to admit for observation and K monitoring pending CT report.  8:22 PM Discussed results with patient, patient has not had any vomiting while in the ER, primary complaint at this time is fecal urgency. Reviewed CT report with patient, large uterine fibroid, other wise does not appear constipated. Patient declines rectal exam at this time, states last colonoscopy was 1 month ago, advised she had a "few spots but no cancer." Patient has not provided urine sample, states she does not feel a need to go, ordered straight cath for UA. Abdomen is soft and non tender.  8:22 PM Patient consents to rectal exam, disimpacted soft brown stool. Denies abdominal pain, no emesis while in the ER, finishing 3rd run of KCl. Discussed with Dr. Ellender Hose, will repeat BMP and EKG, if improved and tolerating POs, patient may be dc.  8:22 PM BMP with K improved to 3.1. Patient is able to tolerate POs, EKG with improved QT interval. Patient's only complaint is fecal urgency, despite disimpaction. Recommend patient follow up with PCP, referral given to Gyn for uterine fibroid. Return to ER for worsening or concerning symptoms.  Physical Exam  BP (!) 135/108   Pulse 92   Temp 98 F (36.7 C) (Oral)   Resp (!) 5   LMP 04/24/2018   SpO2 99%   Physical Exam  ED Course/Procedures     Procedures  MDM         Tacy Learn, PA-C 05/22/18 2022    Duffy Bruce, MD 05/23/18 1332

## 2018-05-22 NOTE — ED Notes (Signed)
Patient aware we need urine sample.  

## 2018-05-22 NOTE — ED Provider Notes (Signed)
[facility] DEPT Provider Note   CSN: [id number] Arrival date & time: 05/22/18  1241     History   Chief Complaint Chief Complaint  Patient presents with  . Constipation  . Emesis    HPI Diamond Rice is a 47 y.o. female with history of angina, COPD, MI, migraines, obesity, stroke, type 2 diabetes mellitus presents for evaluation of acute onset, per aggressively worsening abdominal pain for 1 week.  She notes constant left-sided pressure-like pain for 1 week with associated constipation.  She states that she typically has a bowel movement every day or every other day but has not had one in over 1 week.  She developed nausea and vomiting 2 to 3 days ago and states she has not been able to keep anything down since then.  Earlier today outside of the wound care clinic she began to feel lightheaded but denies syncope.  Has not had any lightheadedness since.  Denies chest pain, shortness of breath, headaches, vision changes, numbness.  Endorses generalized weakness/fatigue.  Has tried a stool softener without relief of her symptoms.  No history of prior abdominal surgeries.  She denies urinary frequency, urgency, dysuria, or hematuria.  Denies rectal pain.  The history is provided by the patient.    Past Medical History:  Diagnosis Date  . Anginal pain ([place])   . Anxiety   . Chronic lower back pain 04/17/2012   "just got over some; catched when I walked"  . COPD (chronic obstructive pulmonary disease) ([place])   . Depression   . Headache(784.0) 04/17/2012   "~ qod; lately waking up in am w/one"  . Heart attack ([place])   . High cholesterol   . Hypertension   . Migraines 04/17/2012  . Obesity   . Shortness of breath 04/17/2012   "all the time"  . Sleep apnea    scheduled to get her CPAP machine this week  . Stroke ([place])   . Type II diabetes mellitus ([place]) 08/28/2002    Patient Active Problem List   Diagnosis Date Noted  . GI bleeding 04/02/2018  . Acute  renal failure (ARF) ([place]) 04/02/2018  . Hypokalemia 04/02/2018  . Polysubstance abuse ([place]) 04/02/2018  . Diabetic foot ulcer ([place]) 04/02/2018  . Cannabis use disorder, moderate, dependence ([place]) 05/19/2017  . Cocaine use disorder, moderate, dependence ([place]) 05/19/2017  . Schizoaffective disorder, bipolar type ([place]) 10/07/2015  . OSA (obstructive sleep apnea) 06/09/2015  . Vitamin D deficiency 01/06/2015  . Bilateral knee pain 01/05/2015  . Numbness and tingling in right hand 01/05/2015  . Insomnia 12/07/2014  . Right-sided lacunar infarction ([place]) 09/15/2014  . Left hemiparesis ([employer]) 09/15/2014  . Dyspnea   . Back pain 09/11/2013  . Psychoactive substance-induced organic mood disorder ([place]) 05/28/2013  . Cocaine abuse with cocaine-induced mood disorder ([place]) 05/28/2013  . Cannabis dependence with cannabis-induced anxiety disorder ([place]) 05/28/2013  . Morbid obesity with BMI of 45.0-49.9, adult ([place]) 04/25/2012  . Chest pain 04/17/2012  . Hypertension 04/17/2012  . Diabetes mellitus type 2 with complications, uncontrolled ([place]) 04/17/2012  . Hyperlipidemia 04/17/2012  . Tobacco use 04/17/2012    Past Surgical History:  Procedure Laterality Date  . BIOPSY  04/04/2018   Procedure: BIOPSY;  Surgeon: [name], MD;  Location: [facility] ENDOSCOPY;  Service: Endoscopy;;  . CARDIAC CATHETERIZATION  ~ 2011  . COLONOSCOPY N/A 04/04/2018   Procedure: COLONOSCOPY;  Surgeon: [name], MD;  Location: [facility] ENDOSCOPY;  Service: Endoscopy;  Laterality: N/A;  . POLYPECTOMY  04/04/2018   Procedure: POLYPECTOMY;  Surgeon: [name], MD;  Location: [facility] ENDOSCOPY;  Service: Endoscopy;;     OB History   None      Home Medications    Prior to Admission medications   Medication Sig Start Date End Date Taking? Authorizing Provider  amoxicillin-clavulanate (AUGMENTIN) 875-125 MG tablet Take 1 tablet by mouth 2 (two) times daily.   Yes [provider]  ARIPiprazole (ABILIFY) 15 MG tablet  Take 1 tablet (15 mg total) by mouth at bedtime. 01/15/18  Yes Patrecia Pour, NP  cloNIDine (CATAPRES) 0.3 MG tablet Take 1 tablet (0.3 mg total) by mouth 3 (three) times daily. 01/15/18  Yes Patrecia Pour, NP  furosemide (LASIX) 40 MG tablet Take 1 tablet (40 mg total) by mouth daily. 04/13/18 05/22/18 Yes Shelly Coss, MD  gabapentin (NEURONTIN) 100 MG capsule Take 1 capsule (100 mg total) by mouth 3 (three) times daily. 04/12/18 05/22/18 Yes Shelly Coss, MD  hydrALAZINE (APRESOLINE) 50 MG tablet Take 1 tablet (50 mg total) by mouth every 8 (eight) hours. 01/15/18  Yes Patrecia Pour, NP  Insulin Detemir (LEVEMIR) 100 UNIT/ML Pen Inject 15 Units into the skin 2 (two) times daily.    Yes [provider]  lisinopril (PRINIVIL,ZESTRIL) 20 MG tablet Take 1 tablet (20 mg total) by mouth daily. For high blood pressure 04/12/18 05/22/18 Yes Adhikari, Tamsen Meek, MD  collagenase (SANTYL) ointment Apply topically daily. Apply on the left foot ulcer Patient not taking: Reported on 05/22/2018 04/12/18   Shelly Coss, MD  oxyCODONE-acetaminophen (PERCOCET/ROXICET) 5-325 MG tablet Take 1 tablet by mouth every 6 (six) hours as needed for severe pain. Patient not taking: Reported on 05/16/2018 04/12/18   Shelly Coss, MD    Family History Family History  Problem Relation Age of Onset  . Stroke Brother 25  . Stroke Brother 48  . Heart attack Mother 22  . Stroke Father 90    Social History Social History   Tobacco Use  . Smoking status: Current Every Day Smoker    Packs/day: 0.25    Years: 28.00    Pack years: 7.00    Types: Cigarettes  . Smokeless tobacco: Never Used  Substance Use Topics  . Alcohol use: No    Alcohol/week: 0.0 standard drinks    Comment: rare  . Drug use: Yes    Types: Marijuana, "Crack" cocaine    Comment: marijuana last use days ago; last cocaine several months ago     Allergies   Morphine and related and Metformin   Review of Systems Review of Systems    Constitutional: Negative for chills and fever.  Respiratory: Negative for shortness of breath.   Cardiovascular: Negative for chest pain.  Gastrointestinal: Positive for abdominal pain, constipation, nausea and vomiting. Negative for blood in stool.  Neurological: Positive for light-headedness. Negative for syncope, weakness and headaches.  All other systems reviewed and are negative.    Physical Exam Updated Vital Signs BP (!) 153/126   Pulse 80   Temp 98 F (36.7 C) (Oral)   Resp 14   LMP 04/24/2018   SpO2 98%   Physical Exam  Constitutional: She appears well-developed and well-nourished. No distress.  HENT:  Head: Normocephalic and atraumatic.  Eyes: Conjunctivae are normal. Right eye exhibits no discharge. Left eye exhibits no discharge.  Neck: No JVD present. No tracheal deviation present.  Cardiovascular: Normal rate and regular rhythm.  Pulmonary/Chest: Effort normal and breath sounds normal. No stridor. No respiratory distress.  She has no wheezes. She has no rales. She exhibits no tenderness.  Abdominal: Soft. She exhibits no distension. Bowel sounds are decreased. There is tenderness in the epigastric area, periumbilical area, suprapubic area, left upper quadrant and left lower quadrant. There is guarding. There is no rigidity, no rebound and no CVA tenderness.  Musculoskeletal: She exhibits no edema.  Neurological: She is alert.  Skin: Skin is warm and dry. No erythema.  Psychiatric: She has a normal mood and affect. Her behavior is normal.  Nursing note and vitals reviewed.    ED Treatments / Results  Labs (all labs ordered are listed, but only abnormal results are displayed) Labs Reviewed  COMPREHENSIVE METABOLIC PANEL - Abnormal; Notable for the following components:      Result Value   Potassium 2.7 (*)    Glucose, Bld 181 (*)    Creatinine, Ser 1.61 (*)    AST 13 (*)    GFR calc non Af Amer 37 (*)    GFR calc Af Amer 43 (*)    All other components  within normal limits  CBC - Abnormal; Notable for the following components:   Hemoglobin 10.9 (*)    HCT 33.6 (*)    MCV 69.6 (*)    MCH 22.6 (*)    All other components within normal limits  LIPASE, BLOOD  MAGNESIUM  URINALYSIS, ROUTINE W REFLEX MICROSCOPIC  I-STAT BETA HCG BLOOD, ED ([facility], [facility], [facility] ONLY)    EKG EKG Interpretation  Date/Time:  Wednesday May 22 2018 14:16:41 EDT Ventricular Rate:  70 PR Interval:    QRS Duration: 86 QT Interval:  529 QTC Calculation: 571 R Axis:   82 Text Interpretation:  Sinus rhythm Ventricular premature complex Consider left ventricular hypertrophy Prolonged QT interval Since prior ECG, QTc appears longer Confirmed by Gareth Morgan [phone]) on 05/22/2018 3:10:10 PM   Radiology Ct Abdomen Pelvis W Contrast  Result Date: 05/22/2018 CLINICAL DATA:  Constipation, vomiting, diffuse abdominal pain EXAM: CT ABDOMEN AND PELVIS WITH CONTRAST TECHNIQUE: Multidetector CT imaging of the abdomen and pelvis was performed using the standard protocol following bolus administration of intravenous contrast. CONTRAST:  37mL OMNIPAQUE IOHEXOL 300 MG/ML  SOLN COMPARISON:  04/02/2018 FINDINGS: Lower chest: No acute abnormality. Hepatobiliary: Focal fatty infiltration of the liver along the falciform ligament anteriorly, image 26 series 2. No other significant hepatic abnormality or biliary dilatation. Patent portal vein. Gallbladder biliary system unremarkable. Common bile duct nondilated. Pancreas: Unremarkable. No pancreatic ductal dilatation or surrounding inflammatory changes. Spleen: Normal in size without focal abnormality. Adrenals/Urinary Tract: Adrenal glands are unremarkable. Kidneys are normal, without renal calculi, focal lesion, or hydronephrosis. Bladder is unremarkable. Stomach/Bowel: Stomach is within normal limits. Appendix appears normal. No evidence of bowel wall thickening, distention, or inflammatory changes. Vascular/Lymphatic: Aorta is mildly  tortuous and atherosclerotic. Negative for aneurysm or occlusive process. Renal and mesenteric vasculature appear patent. No adenopathy. Reproductive: Uterine fibroids noted, largest on the posterior right aspect measuring 5 cm as before. No other adnexal abnormality. No pelvic free fluid, fluid collection or abscess. Other: No abdominal wall hernia or abnormality. No abdominopelvic ascites. Musculoskeletal: Degenerative changes noted of the spine. No acute osseous finding. IMPRESSION: No acute intra-abdominopelvic finding No acute inflammatory process or abscess. Normal appendix Uterine fibroids Atherosclerosis without aneurysm Electronically Signed   By: Jerilynn Mages.  Shick M.D.   On: 05/22/2018 16:04    Procedures Procedures (including critical care time)  Medications Ordered in ED Medications  potassium chloride 10 mEq in 100 mL IVPB (  10 mEq Intravenous New Bag/Given 05/22/18 1551)  0.9 %  sodium chloride infusion (500 mLs Intravenous New Bag/Given 05/22/18 1424)  0.9 %  sodium chloride infusion ( Intravenous Canceled Entry 05/22/18 1538)  potassium chloride SA (K-DUR,KLOR-CON) CR tablet 40 mEq (40 mEq Oral Given 05/22/18 1444)  magnesium sulfate IVPB 1 g 100 mL ( Intravenous Paused 05/22/18 1457)  ondansetron (ZOFRAN) injection 4 mg (4 mg Intravenous Given 05/22/18 1447)  iohexol (OMNIPAQUE) 300 MG/ML solution 75 mL (75 mLs Intravenous Contrast Given 05/22/18 1522)     Initial Impression / Assessment and Plan / ED Course  I have reviewed the triage vital signs and the nursing notes.  Pertinent labs & imaging results that were available during my care of the patient were reviewed by me and considered in my medical decision making (see chart for details).     Patient presents with constipation, left-sided abdominal pain for 1 week.  Developed nausea and vomiting 2 to 3 days ago has not been able to tolerate p.o. food or fluids.  She is afebrile, hypertensive in the ED.  Has not been able to tolerate her  home medicines.  No peritoneal signs on examination of the abdomen.  Lab work reviewed by me significant for hypokalemia with potassium of 2.7 and elevated creatinine of 1.61.  EKG shows prolonged QT.  Likely secondary to persistent vomiting and Lasix.  Awaiting UA.  CT abdomen and pelvis shows no acute intra-abdominal pelvic finding, no evidence of acute surgical abdominal pathology.  She was given Zofran, p.o. and IV potassium as well as magnesium in the ED.  Given multiple comorbidities, EKG changes, recommend admission to the ED for further observation and management.   4:17 PM Signed out to oncoming provider PA Percell Miller. Awaiting UA.  Anticipate admission.   Final Clinical Impressions(s) / ED Diagnoses   Final diagnoses:  Hypokalemia  Nausea and vomiting in adult patient  Left sided abdominal pain    ED Discharge Orders    None       Renita Papa, PA-C 05/22/18 1617    Gareth Morgan, MD 05/22/18 2315

## 2018-05-22 NOTE — Discharge Instructions (Signed)
Follow-up with your primary care provider.  Referral to gynecology was given today for follow-up regarding the fibroid. Return to ER for worsening or concerning symptoms.

## 2018-05-23 ENCOUNTER — Ambulatory Visit (INDEPENDENT_AMBULATORY_CARE_PROVIDER_SITE_OTHER): Payer: Medicare Other | Admitting: Internal Medicine

## 2018-05-23 ENCOUNTER — Encounter: Payer: Self-pay | Admitting: Internal Medicine

## 2018-05-23 ENCOUNTER — Ambulatory Visit (HOSPITAL_COMMUNITY)
Admission: RE | Admit: 2018-05-23 | Discharge: 2018-05-23 | Disposition: A | Discharge status: Home / Self Care | Payer: Medicare Other | Source: Ambulatory Visit | Attending: Cardiology | Admitting: Cardiology

## 2018-05-23 VITALS — BP 121/83 | HR 69 | Temp 97.8°F | Wt 190.8 lb

## 2018-05-23 DIAGNOSIS — M86672 Other chronic osteomyelitis, left ankle and foot: Secondary | ICD-10-CM | POA: Diagnosis present

## 2018-05-23 DIAGNOSIS — E876 Hypokalemia: Secondary | ICD-10-CM

## 2018-05-23 DIAGNOSIS — R0989 Other specified symptoms and signs involving the circulatory and respiratory systems: Secondary | ICD-10-CM | POA: Diagnosis present

## 2018-05-23 MED ORDER — AMOXICILLIN-POT CLAVULANATE 875-125 MG PO TABS: 1.0000 | ORAL_TABLET | Freq: Two times a day (BID) | ORAL | 0 refills | Status: DC

## 2018-05-23 MED ORDER — POTASSIUM CHLORIDE ER 10 MEQ PO TBCR: 20.0000 meq | EXTENDED_RELEASE_TABLET | Freq: Every day | ORAL | 0 refills | Status: DC

## 2018-05-23 MED ORDER — CIPROFLOXACIN HCL 500 MG PO TABS: 500.0000 mg | ORAL_TABLET | Freq: Two times a day (BID) | ORAL | 0 refills | Status: DC

## 2018-05-23 NOTE — Progress Notes (Signed)
RFV: chronic osteomyelitis of left foot  Patient ID: Diamond Rice, female   DOB: 10-17-1970, 47 y.o.   MRN: [id number]  HPI 47yo F hx of stroke with left sided weakness, diabetes, referred by dr Dellia Nims and wound clinic who have been managing ms. Bertucci' Open wound with tendon exposure to dorsum of left foot over 4th/3rd MT that has progressed despite local wound care and oral abtx.  Patient states she injured her foot due to a fall  June. Since then the wound has become bigger and non healing. She  is slowly walking due to the pain from the foot ulcer. On repeat imaging this past august, appears now to have signs of early osteomyelitis. Notes per dr Dellia Nims unclear if able to get much more improvement and may need surgical amputation. She reports that she has not seen surgery yet, and foot continues to drain serous-?mucopurulent fluid on her bandage. Wound bed is relatively clear with some necrotic exudate to exposed tendons. She has had vascular studies suggesting decreased arterial flow to left leg - has vascular referral in the system   an arterial Doppler showed an ABI of 1.12 on the right and 1.03 on the left. Ulceration to the left dorsal foot at the 3rd and 4th toes.  She also states that she went to the ED for N/V yesterday and was found to have low potassium for which they repleted however, they did not make any mention of her foot wound. Outpatient Encounter Medications as of 05/23/2018  Medication Sig  . amoxicillin-clavulanate (AUGMENTIN) 875-125 MG tablet Take 1 tablet by mouth 2 (two) times daily.  . ARIPiprazole (ABILIFY) 15 MG tablet Take 1 tablet (15 mg total) by mouth at bedtime.  . ciprofloxacin (CIPRO) 500 MG tablet Take 1 tablet (500 mg total) by mouth 2 (two) times daily.  . cloNIDine (CATAPRES) 0.3 MG tablet Take 1 tablet (0.3 mg total) by mouth 3 (three) times daily.  . collagenase (SANTYL) ointment Apply topically daily. Apply on the left foot ulcer  . hydrALAZINE  (APRESOLINE) 50 MG tablet Take 1 tablet (50 mg total) by mouth every 8 (eight) hours.  . Insulin Detemir (LEVEMIR) 100 UNIT/ML Pen Inject 15 Units into the skin 2 (two) times daily.   Marland Kitchen oxyCODONE-acetaminophen (PERCOCET/ROXICET) 5-325 MG tablet Take 1 tablet by mouth every 6 (six) hours as needed for severe pain.  . [DISCONTINUED] amoxicillin-clavulanate (AUGMENTIN) 875-125 MG tablet Take 1 tablet by mouth 2 (two) times daily.  . [DISCONTINUED] ciprofloxacin (CIPRO) 500 MG tablet Take 500 mg by mouth 2 (two) times daily.  . furosemide (LASIX) 40 MG tablet Take 1 tablet (40 mg total) by mouth daily.  Marland Kitchen gabapentin (NEURONTIN) 100 MG capsule Take 1 capsule (100 mg total) by mouth 3 (three) times daily.  Marland Kitchen lisinopril (PRINIVIL,ZESTRIL) 20 MG tablet Take 1 tablet (20 mg total) by mouth daily. For high blood pressure  . potassium chloride (K-DUR) 10 MEQ tablet Take 2 tablets (20 mEq total) by mouth daily.   No facility-administered encounter medications on file as of 05/23/2018.      Patient Active Problem List   Diagnosis Date Noted  . GI bleeding 04/02/2018  . Acute renal failure (ARF) ([place]) 04/02/2018  . Hypokalemia 04/02/2018  . Polysubstance abuse ([place]) 04/02/2018  . Diabetic foot ulcer ([place]) 04/02/2018  . Cannabis use disorder, moderate, dependence ([place]) 05/19/2017  . Cocaine use disorder, moderate, dependence ([place]) 05/19/2017  . Schizoaffective disorder, bipolar type ([place]) 10/07/2015  . OSA (obstructive sleep apnea)  06/09/2015  . Vitamin D deficiency 01/06/2015  . Bilateral knee pain 01/05/2015  . Numbness and tingling in right hand 01/05/2015  . Insomnia 12/07/2014  . Right-sided lacunar infarction ([place]) 09/15/2014  . Left hemiparesis ([place]) 09/15/2014  . Dyspnea   . Back pain 09/11/2013  . Psychoactive substance-induced organic mood disorder ([place]) 05/28/2013  . Cocaine abuse with cocaine-induced mood disorder ([place]) 05/28/2013  . Cannabis dependence with cannabis-induced anxiety  disorder ([place]) 05/28/2013  . Morbid obesity with BMI of 45.0-49.9, adult ([place]) 04/25/2012  . Chest pain 04/17/2012  . Hypertension 04/17/2012  . Diabetes mellitus type 2 with complications, uncontrolled ([place]) 04/17/2012  . Hyperlipidemia 04/17/2012  . Tobacco use 04/17/2012     Health Maintenance Due  Topic Date Due  . TETANUS/TDAP  05/07/1991  . PAP SMEAR  05/06/1993  . OPHTHALMOLOGY EXAM  08/13/2014  . FOOT EXAM  10/15/2015  . HEMOGLOBIN A1C  04/07/2016  . INFLUENZA VACCINE  03/28/2018     Review of Systems Left foot wound , limited ability to walk due to pain . Left sided weakness from stroke. Otherwise 12 point ros is negative Physical Exam   BP 121/83   Pulse 69   Temp 97.8 F (36.6 C)   Wt 190 lb 12.8 oz (86.5 kg)   LMP 03/13/2018 (Approximate)   BMI 36.05 kg/m   Physical Exam  Constitutional:  oriented to person, place, and time. appears well-developed and well-nourished. No distress.  HENT: [place]/AT, PERRLA, no scleral icterus Mouth/Throat: Oropharynx is clear and moist. No oropharyngeal exudate.  Cardiovascular: Normal rate, regular rhythm and normal heart sounds. Exam reveals no gallop and no friction rub.  No murmur heard.  Pulmonary/Chest: Effort normal and breath sounds normal. No respiratory distress.  has no wheezes.  Ext: large 5 x 5 cm open wound to dorsum of foot about 4th MT with tendon exposure. Swelling with serous fluid in the wound bed Neurological: alert and oriented to person, place, and time. Left arm weakness-prior stroke Skin: Skin is warm and dry. No rash noted. No erythema.  Psychiatric: a normal mood and affect.  behavior is normal.   CBC Lab Results  Component Value Date   WBC 9.2 05/22/2018   RBC 4.83 05/22/2018   HGB 10.9 (L) 05/22/2018   HCT 33.6 (L) 05/22/2018   PLT 387 05/22/2018   MCV 69.6 (L) 05/22/2018   MCH 22.6 (L) 05/22/2018   MCHC 32.4 05/22/2018   RDW 15.5 05/22/2018   LYMPHSABS 3.9 04/12/2018   MONOABS 0.9 04/12/2018     EOSABS 0.4 04/12/2018    BMET Lab Results  Component Value Date   NA 137 05/22/2018   K 3.1 (L) 05/22/2018   CL 98 05/22/2018   CO2 27 05/22/2018   GLUCOSE 133 (H) 05/22/2018   BUN 15 05/22/2018   CREATININE 1.55 (H) 05/22/2018   CALCIUM 9.4 05/22/2018   GFRNONAA 39 (L) 05/22/2018   GFRAA 45 (L) 05/22/2018   Lab Results  Component Value Date   ESRSEDRATE 53 (H) 05/23/2018   Lab Results  Component Value Date   CRP 10.1 (H) 05/23/2018      Assessment and Plan Hypokalemia = Will give potassium 8mq daily x 7 d  Diabetic foot osteo = Gave refills on oral abtx Recommend to come to the hospital for vascular evaluation and surgical debridement

## 2018-05-24 ENCOUNTER — Other Ambulatory Visit: Payer: Self-pay

## 2018-05-24 ENCOUNTER — Encounter: Payer: Self-pay | Admitting: *Deleted

## 2018-05-24 ENCOUNTER — Encounter: Payer: Self-pay | Admitting: Vascular Surgery

## 2018-05-24 ENCOUNTER — Ambulatory Visit (INDEPENDENT_AMBULATORY_CARE_PROVIDER_SITE_OTHER): Payer: Medicare Other | Admitting: Vascular Surgery

## 2018-05-24 ENCOUNTER — Other Ambulatory Visit: Payer: Self-pay | Admitting: *Deleted

## 2018-05-24 ENCOUNTER — Ambulatory Visit: Payer: Medicare Other | Admitting: Podiatry

## 2018-05-24 VITALS — BP 160/95 | HR 71 | Temp 98.1°F | Resp 18 | Ht 61.0 in | Wt 192.6 lb

## 2018-05-24 DIAGNOSIS — I739 Peripheral vascular disease, unspecified: Secondary | ICD-10-CM

## 2018-05-24 LAB — BASIC METABOLIC PANEL
BUN / CREAT RATIO: 9 (calc) (ref 6–22)
BUN: 16 mg/dL (ref 7–25)
CALCIUM: 9.9 mg/dL (ref 8.6–10.2)
CHLORIDE: 96 mmol/L — AB (ref 98–110)
CO2: 27 mmol/L (ref 20–32)
Creat: 1.81 mg/dL — ABNORMAL HIGH (ref 0.50–1.10)
GLUCOSE: 150 mg/dL — AB (ref 65–99)
Potassium: 3.4 mmol/L — ABNORMAL LOW (ref 3.5–5.3)
Sodium: 136 mmol/L (ref 135–146)

## 2018-05-24 LAB — SEDIMENTATION RATE: Sed Rate: 53 mm/h — ABNORMAL HIGH (ref 0–20)

## 2018-05-24 LAB — C-REACTIVE PROTEIN: CRP: 10.1 mg/L — ABNORMAL HIGH (ref <8.0)

## 2018-05-24 NOTE — Progress Notes (Signed)
Patient ID: Diamond Rice, female   DOB: 06-24-1971, 47 y.o.   MRN: [id number]  Reason for Consult: New Patient (Initial Visit) (eval necrotid L foot ulcer - Dr. Linton Ham)   Referred by [name], MD  Subjective:     HPI:  Diamond Rice is a 47 y.o. female with history of a stroke affecting her left side requiring her to walk with a cane.  She has a wound on her left foot that is been present since June.  She is a diabetic.  She does not have kidney disease to her knowledge.  Other risk factors include high cholesterol hypertension.  She has been maintained on oral antibiotics was recommended to go to the hospital yesterday for surgical debridement.  She is not having any fevers.  She does have persistent drainage.  Past Medical History:  Diagnosis Date  . Anginal pain ([place])   . Anxiety   . Chronic lower back pain 04/17/2012   "just got over some; catched when I walked"  . COPD (chronic obstructive pulmonary disease) ([place])   . Depression   . Headache(784.0) 04/17/2012   "~ qod; lately waking up in am w/one"  . Heart attack ([place])   . High cholesterol   . Hypertension   . Migraines 04/17/2012  . Obesity   . Shortness of breath 04/17/2012   "all the time"  . Sleep apnea    scheduled to get her CPAP machine this week  . Stroke ([place])   . Type II diabetes mellitus ([place]) 08/28/2002   Family History  Problem Relation Age of Onset  . Stroke Brother 13  . Stroke Brother 34  . Heart attack Mother 80  . Stroke Father 83   Past Surgical History:  Procedure Laterality Date  . BIOPSY  04/04/2018   Procedure: BIOPSY;  Surgeon: [name], MD;  Location[facility] ENDOSCOPY;  Service: Endoscopy;;  . CARDIAC CATHETERIZATION  ~ 2011  . COLONOSCOPY N/A 04/04/2018   Procedure: COLONOSCOPY;  Surgeon: [name], MD;  Location: [facility] ENDOSCOPY;  Service: Endoscopy;  Laterality: N/A;  . POLYPECTOMY  04/04/2018   Procedure: POLYPECTOMY;  Surgeon: [name], MD;  Location: [facility]  ENDOSCOPY;  Service: Endoscopy;;    Short Social History:  Social History   Tobacco Use  . Smoking status: Current Every Day Smoker    Packs/day: 0.25    Years: 28.00    Pack years: 7.00    Types: Cigarettes  . Smokeless tobacco: Never Used  Substance Use Topics  . Alcohol use: No    Alcohol/week: 0.0 standard drinks    Comment: rare    Allergies  Allergen Reactions  . Morphine And Related Hives  . Metformin Diarrhea    Current Outpatient Medications  Medication Sig Dispense Refill  . amoxicillin-clavulanate (AUGMENTIN) 875-125 MG tablet Take 1 tablet by mouth 2 (two) times daily. 60 tablet 0  . ARIPiprazole (ABILIFY) 15 MG tablet Take 1 tablet (15 mg total) by mouth at bedtime. 30 tablet 0  . ciprofloxacin (CIPRO) 500 MG tablet Take 1 tablet (500 mg total) by mouth 2 (two) times daily. 60 tablet 0  . cloNIDine (CATAPRES) 0.3 MG tablet Take 1 tablet (0.3 mg total) by mouth 3 (three) times daily. 90 tablet 0  . collagenase (SANTYL) ointment Apply topically daily. Apply on the left foot ulcer 15 g 0  . hydrALAZINE (APRESOLINE) 50 MG tablet Take 1 tablet (50 mg total) by mouth every 8 (eight) hours. 90 tablet 0  .  Insulin Detemir (LEVEMIR) 100 UNIT/ML Pen Inject 15 Units into the skin 2 (two) times daily.     Marland Kitchen oxyCODONE-acetaminophen (PERCOCET/ROXICET) 5-325 MG tablet Take 1 tablet by mouth every 6 (six) hours as needed for severe pain. 10 tablet 0  . potassium chloride (K-DUR) 10 MEQ tablet Take 2 tablets (20 mEq total) by mouth daily. 14 tablet 0  . furosemide (LASIX) 40 MG tablet Take 1 tablet (40 mg total) by mouth daily. 30 tablet 0  . gabapentin (NEURONTIN) 100 MG capsule Take 1 capsule (100 mg total) by mouth 3 (three) times daily. 90 capsule 0  . lisinopril (PRINIVIL,ZESTRIL) 20 MG tablet Take 1 tablet (20 mg total) by mouth daily. For high blood pressure     No current facility-administered medications for this visit.     Review of Systems  Constitutional:   Constitutional negative. HENT: HENT negative.  Eyes: Eyes negative.  Respiratory: Respiratory negative.  Cardiovascular: Cardiovascular negative.  GI: Gastrointestinal negative.  Skin: Positive for wound.  Neurological: Positive for focal weakness.  Hematologic: Hematologic/lymphatic negative.  Psychiatric: Positive for depressed mood.        Objective:  Objective   Vitals:   05/24/18 1056 05/24/18 1058  BP: (!) 165/101 (!) 160/95  Pulse: 71   Resp: 18   Temp: 98.1 F (36.7 C)   TempSrc: Oral   SpO2: 100%   Weight: 192 lb 9.6 oz (87.4 kg)   Height: 5\' 1"  (1.549 m)    Body mass index is 36.39 kg/m.  Physical Exam  Constitutional: She is oriented to person, place, and time. She appears well-developed.  HENT:  Head: Normocephalic.  Eyes: Pupils are equal, round, and reactive to light.  Neck: Normal range of motion.  Cardiovascular: Normal rate.  Pulses:      Popliteal pulses are 2+ on the right side, and 2+ on the left side.       Dorsalis pedis pulses are 0 on the right side, and 0 on the left side.       Posterior tibial pulses are 0 on the right side, and 0 on the left side.  Monophasic signals left dorsalis pedis and posterior tibial arteries  Musculoskeletal: She exhibits edema.  Neurological: She is alert and oriented to person, place, and time.  Psychiatric: She has a normal mood and affect. Her behavior is normal. Judgment and thought content normal.    Data: I reviewed her ABIs from August which demonstrated abnormal toe pressures of 36 on the left but otherwise triphasic waveforms with an ABI of 0.93 on the left.  She had ABIs and left lower extremity duplex performed yesterday but studies are not available for evaluation and were performed at a separate institution.     Assessment/Plan:     47 year old female with nonhealing ulceration of her left foot with exposed tendon.  She will need to have amputation of her toes 3 through 5 with possible  transmetatarsal amputation.  We will begin with angiogram possibly need CO2 given previous history of renal insufficiency and will plan for admission post procedure with amputation of the toes.  I will communicate this with the wound care center.  She has any issues over the weekend she can be admitted to the hospital and we can proceed with our procedures Monday and Tuesday as scheduled.     Waynetta Sandy MD [facility]

## 2018-05-27 ENCOUNTER — Encounter (HOSPITAL_COMMUNITY)
Admission: RE | Disposition: A | Discharge status: Skilled Nursing Facility | Payer: Self-pay | Source: Ambulatory Visit | Attending: Vascular Surgery

## 2018-05-27 ENCOUNTER — Encounter (HOSPITAL_COMMUNITY): Payer: Self-pay | Admitting: General Practice

## 2018-05-27 ENCOUNTER — Other Ambulatory Visit: Payer: Self-pay

## 2018-05-27 ENCOUNTER — Inpatient Hospital Stay (HOSPITAL_COMMUNITY)
Admission: RE | Admit: 2018-05-27 | Discharge: 2018-06-10 | DRG: 253 | Disposition: A | Discharge status: Skilled Nursing Facility | Payer: Medicare Other | Source: Ambulatory Visit | Attending: Vascular Surgery | Admitting: Vascular Surgery

## 2018-05-27 DIAGNOSIS — G4730 Sleep apnea, unspecified: Secondary | ICD-10-CM | POA: Diagnosis not present

## 2018-05-27 DIAGNOSIS — I252 Old myocardial infarction: Secondary | ICD-10-CM | POA: Diagnosis not present

## 2018-05-27 DIAGNOSIS — F419 Anxiety disorder, unspecified: Secondary | ICD-10-CM | POA: Diagnosis present

## 2018-05-27 DIAGNOSIS — F418 Other specified anxiety disorders: Secondary | ICD-10-CM | POA: Diagnosis not present

## 2018-05-27 DIAGNOSIS — I70229 Atherosclerosis of native arteries of extremities with rest pain, unspecified extremity: Secondary | ICD-10-CM

## 2018-05-27 DIAGNOSIS — E1152 Type 2 diabetes mellitus with diabetic peripheral angiopathy with gangrene: Secondary | ICD-10-CM | POA: Diagnosis not present

## 2018-05-27 DIAGNOSIS — N189 Chronic kidney disease, unspecified: Secondary | ICD-10-CM | POA: Diagnosis not present

## 2018-05-27 DIAGNOSIS — I9789 Other postprocedural complications and disorders of the circulatory system, not elsewhere classified: Secondary | ICD-10-CM | POA: Diagnosis not present

## 2018-05-27 DIAGNOSIS — I998 Other disorder of circulatory system: Secondary | ICD-10-CM | POA: Diagnosis present

## 2018-05-27 DIAGNOSIS — M96831 Postprocedural hemorrhage and hematoma of a musculoskeletal structure following other procedure: Secondary | ICD-10-CM | POA: Diagnosis not present

## 2018-05-27 DIAGNOSIS — I69354 Hemiplegia and hemiparesis following cerebral infarction affecting left non-dominant side: Secondary | ICD-10-CM | POA: Diagnosis not present

## 2018-05-27 DIAGNOSIS — G8929 Other chronic pain: Secondary | ICD-10-CM | POA: Diagnosis present

## 2018-05-27 DIAGNOSIS — J449 Chronic obstructive pulmonary disease, unspecified: Secondary | ICD-10-CM | POA: Diagnosis not present

## 2018-05-27 DIAGNOSIS — Z9889 Other specified postprocedural states: Secondary | ICD-10-CM | POA: Diagnosis not present

## 2018-05-27 DIAGNOSIS — F17210 Nicotine dependence, cigarettes, uncomplicated: Secondary | ICD-10-CM | POA: Diagnosis present

## 2018-05-27 DIAGNOSIS — E1122 Type 2 diabetes mellitus with diabetic chronic kidney disease: Secondary | ICD-10-CM | POA: Diagnosis present

## 2018-05-27 DIAGNOSIS — F259 Schizoaffective disorder, unspecified: Secondary | ICD-10-CM | POA: Diagnosis present

## 2018-05-27 DIAGNOSIS — I129 Hypertensive chronic kidney disease with stage 1 through stage 4 chronic kidney disease, or unspecified chronic kidney disease: Secondary | ICD-10-CM | POA: Diagnosis present

## 2018-05-27 DIAGNOSIS — K922 Gastrointestinal hemorrhage, unspecified: Secondary | ICD-10-CM | POA: Diagnosis present

## 2018-05-27 DIAGNOSIS — F319 Bipolar disorder, unspecified: Secondary | ICD-10-CM | POA: Diagnosis present

## 2018-05-27 DIAGNOSIS — I70245 Atherosclerosis of native arteries of left leg with ulceration of other part of foot: Secondary | ICD-10-CM | POA: Diagnosis not present

## 2018-05-27 DIAGNOSIS — N179 Acute kidney failure, unspecified: Secondary | ICD-10-CM | POA: Diagnosis not present

## 2018-05-27 HISTORY — PX: LOWER EXTREMITY ANGIOGRAM: SHX5955

## 2018-05-27 HISTORY — DX: Atherosclerosis of native arteries of extremities with rest pain, unspecified extremity: I70.229

## 2018-05-27 HISTORY — DX: Other disorder of circulatory system: I99.8

## 2018-05-27 HISTORY — PX: PERIPHERAL VASCULAR INTERVENTION: CATH118257

## 2018-05-27 HISTORY — PX: ABDOMINAL AORTOGRAM W/LOWER EXTREMITY: CATH118223

## 2018-05-27 LAB — POCT I-STAT, CHEM 8
BUN: 15 mg/dL (ref 6–20)
CHLORIDE: 99 mmol/L (ref 98–111)
Calcium, Ion: 1.16 mmol/L (ref 1.15–1.40)
Creatinine, Ser: 1.2 mg/dL — ABNORMAL HIGH (ref 0.44–1.00)
Glucose, Bld: 180 mg/dL — ABNORMAL HIGH (ref 70–99)
HCT: 29 % — ABNORMAL LOW (ref 36.0–46.0)
Hemoglobin: 9.9 g/dL — ABNORMAL LOW (ref 12.0–15.0)
POTASSIUM: 3.5 mmol/L (ref 3.5–5.1)
SODIUM: 136 mmol/L (ref 135–145)
TCO2: 30 mmol/L (ref 22–32)

## 2018-05-27 LAB — PREGNANCY, URINE: Preg Test, Ur: NEGATIVE

## 2018-05-27 LAB — GLUCOSE, CAPILLARY
GLUCOSE-CAPILLARY: 146 mg/dL — AB (ref 70–99)
Glucose-Capillary: 172 mg/dL — ABNORMAL HIGH (ref 70–99)

## 2018-05-27 LAB — POCT ACTIVATED CLOTTING TIME
Activated Clotting Time: 219 seconds
Activated Clotting Time: 230 seconds
Activated Clotting Time: 268 s

## 2018-05-27 SURGERY — ABDOMINAL AORTOGRAM W/LOWER EXTREMITY
Anesthesia: LOCAL | Laterality: Left

## 2018-05-27 MED ORDER — MORPHINE SULFATE (PF) 10 MG/ML IV SOLN: 2.0000 mg | INTRAVENOUS | Status: DC | PRN

## 2018-05-27 MED ORDER — CIPROFLOXACIN HCL 500 MG PO TABS
500.0000 mg | ORAL_TABLET | Freq: Two times a day (BID) | ORAL | Status: DC
  Administered 2018-05-27 – 2018-05-28 (×2): 500 mg via ORAL
  Filled 2018-05-27 (×3): qty 1

## 2018-05-27 MED ORDER — ASPIRIN EC 81 MG PO TBEC
81.0000 mg | DELAYED_RELEASE_TABLET | Freq: Every day | ORAL | Status: DC
  Administered 2018-05-27 – 2018-06-10 (×14): 81 mg via ORAL
  Filled 2018-05-27 (×14): qty 1

## 2018-05-27 MED ORDER — ONDANSETRON HCL 4 MG/2ML IJ SOLN
4.0000 mg | Freq: Four times a day (QID) | INTRAMUSCULAR | Status: DC | PRN
  Administered 2018-05-27 – 2018-06-10 (×9): 4 mg via INTRAVENOUS
  Filled 2018-05-27 (×9): qty 2

## 2018-05-27 MED ORDER — OXYCODONE HCL 5 MG PO TABS
5.0000 mg | ORAL_TABLET | ORAL | Status: DC | PRN
  Administered 2018-05-27: 10 mg via ORAL
  Administered 2018-05-27: 5 mg via ORAL
  Administered 2018-05-28 (×2): 10 mg via ORAL
  Administered 2018-05-29: 5 mg via ORAL
  Administered 2018-05-29: 10 mg via ORAL
  Administered 2018-05-29: 5 mg via ORAL
  Administered 2018-05-29 – 2018-05-30 (×5): 10 mg via ORAL
  Administered 2018-05-31: 5 mg via ORAL
  Administered 2018-05-31 – 2018-06-09 (×31): 10 mg via ORAL
  Administered 2018-06-10: 5 mg via ORAL
  Administered 2018-06-10: 10 mg via ORAL
  Administered 2018-06-10: 5 mg via ORAL
  Filled 2018-05-27: qty 2
  Filled 2018-05-27: qty 1
  Filled 2018-05-27 (×11): qty 2
  Filled 2018-05-27: qty 1
  Filled 2018-05-27 (×8): qty 2
  Filled 2018-05-27: qty 1
  Filled 2018-05-27 (×4): qty 2
  Filled 2018-05-27: qty 1
  Filled 2018-05-27 (×10): qty 2
  Filled 2018-05-27 (×2): qty 1
  Filled 2018-05-27 (×6): qty 2
  Filled 2018-05-27: qty 1

## 2018-05-27 MED ORDER — HYDRALAZINE HCL 20 MG/ML IJ SOLN
5.0000 mg | INTRAMUSCULAR | Status: AC | PRN
  Administered 2018-05-28 – 2018-06-02 (×2): 5 mg via INTRAVENOUS
  Filled 2018-05-27 (×3): qty 1

## 2018-05-27 MED ORDER — LIDOCAINE HCL (PF) 1 % IJ SOLN
INTRAMUSCULAR | Status: AC
  Filled 2018-05-27: qty 30

## 2018-05-27 MED ORDER — LABETALOL HCL 5 MG/ML IV SOLN
10.0000 mg | INTRAVENOUS | Status: AC | PRN
  Administered 2018-05-27 – 2018-05-31 (×4): 10 mg via INTRAVENOUS
  Filled 2018-05-27 (×3): qty 4

## 2018-05-27 MED ORDER — IODIXANOL 320 MG/ML IV SOLN
INTRAVENOUS | Status: DC | PRN
  Administered 2018-05-27: 60 mL via INTRA_ARTERIAL

## 2018-05-27 MED ORDER — HEPARIN SODIUM (PORCINE) 5000 UNIT/ML IJ SOLN
5000.0000 [IU] | Freq: Three times a day (TID) | INTRAMUSCULAR | Status: DC
  Administered 2018-05-27 – 2018-06-06 (×28): 5000 [IU] via SUBCUTANEOUS
  Filled 2018-05-27 (×29): qty 1

## 2018-05-27 MED ORDER — HEPARIN (PORCINE) IN NACL 1000-0.9 UT/500ML-% IV SOLN
INTRAVENOUS | Status: DC | PRN
  Administered 2018-05-27 (×2): 500 mL

## 2018-05-27 MED ORDER — NITROGLYCERIN 1 MG/10 ML FOR IR/CATH LAB
INTRA_ARTERIAL | Status: AC
  Filled 2018-05-27: qty 10

## 2018-05-27 MED ORDER — FENTANYL CITRATE (PF) 100 MCG/2ML IJ SOLN
INTRAMUSCULAR | Status: AC
  Filled 2018-05-27: qty 2

## 2018-05-27 MED ORDER — GABAPENTIN 100 MG PO CAPS
100.0000 mg | ORAL_CAPSULE | Freq: Three times a day (TID) | ORAL | Status: DC
  Administered 2018-05-27 – 2018-06-10 (×41): 100 mg via ORAL
  Filled 2018-05-27 (×41): qty 1

## 2018-05-27 MED ORDER — CLOPIDOGREL BISULFATE 75 MG PO TABS: 300.0000 mg | ORAL_TABLET | Freq: Once | ORAL | Status: AC

## 2018-05-27 MED ORDER — NITROGLYCERIN 1 MG/10 ML FOR IR/CATH LAB
INTRA_ARTERIAL | Status: DC | PRN
  Administered 2018-05-27: 150 ug via INTRA_ARTERIAL

## 2018-05-27 MED ORDER — LISINOPRIL 10 MG PO TABS
20.0000 mg | ORAL_TABLET | Freq: Every day | ORAL | Status: DC
  Administered 2018-05-28 – 2018-06-10 (×14): 20 mg via ORAL
  Filled 2018-05-27 (×4): qty 2
  Filled 2018-05-27: qty 4
  Filled 2018-05-27 (×9): qty 2

## 2018-05-27 MED ORDER — MIDAZOLAM HCL 2 MG/2ML IJ SOLN
INTRAMUSCULAR | Status: DC | PRN
  Administered 2018-05-27 (×2): 1 mg via INTRAVENOUS

## 2018-05-27 MED ORDER — CLOPIDOGREL BISULFATE 300 MG PO TABS
ORAL_TABLET | ORAL | Status: AC
  Filled 2018-05-27: qty 1

## 2018-05-27 MED ORDER — INSULIN DETEMIR 100 UNIT/ML FLEXPEN: 15.0000 [IU] | PEN_INJECTOR | Freq: Two times a day (BID) | SUBCUTANEOUS | Status: DC

## 2018-05-27 MED ORDER — HYDRALAZINE HCL 50 MG PO TABS
50.0000 mg | ORAL_TABLET | Freq: Three times a day (TID) | ORAL | Status: DC
  Administered 2018-05-27 – 2018-06-10 (×40): 50 mg via ORAL
  Filled 2018-05-27 (×41): qty 1

## 2018-05-27 MED ORDER — SODIUM CHLORIDE 0.9 % IV SOLN: INTRAVENOUS | Status: AC

## 2018-05-27 MED ORDER — OXYCODONE HCL 5 MG PO TABS
ORAL_TABLET | ORAL | Status: AC
  Filled 2018-05-27: qty 1

## 2018-05-27 MED ORDER — CLOPIDOGREL BISULFATE 75 MG PO TABS
75.0000 mg | ORAL_TABLET | Freq: Every day | ORAL | Status: DC
  Administered 2018-05-28 – 2018-06-10 (×13): 75 mg via ORAL
  Filled 2018-05-27 (×14): qty 1

## 2018-05-27 MED ORDER — LIDOCAINE HCL (PF) 1 % IJ SOLN
INTRAMUSCULAR | Status: DC | PRN
  Administered 2018-05-27: 2 mL via INTRADERMAL
  Administered 2018-05-27: 20 mL via INTRADERMAL

## 2018-05-27 MED ORDER — HEPARIN (PORCINE) IN NACL 1000-0.9 UT/500ML-% IV SOLN
INTRAVENOUS | Status: AC
  Filled 2018-05-27: qty 500

## 2018-05-27 MED ORDER — SODIUM CHLORIDE 0.9 % IV SOLN
INTRAVENOUS | Status: DC
  Administered 2018-05-27: 09:00:00 via INTRAVENOUS

## 2018-05-27 MED ORDER — FENTANYL CITRATE (PF) 100 MCG/2ML IJ SOLN
INTRAMUSCULAR | Status: DC | PRN
  Administered 2018-05-27 (×2): 25 ug via INTRAVENOUS

## 2018-05-27 MED ORDER — CLOPIDOGREL BISULFATE 300 MG PO TABS
ORAL_TABLET | ORAL | Status: DC | PRN
  Administered 2018-05-27: 300 mg via ORAL

## 2018-05-27 MED ORDER — HYDROMORPHONE HCL 1 MG/ML IJ SOLN
0.5000 mg | INTRAMUSCULAR | Status: DC | PRN
  Administered 2018-05-27 – 2018-05-28 (×2): 0.5 mg via INTRAVENOUS
  Filled 2018-05-27 (×2): qty 1

## 2018-05-27 MED ORDER — POTASSIUM CHLORIDE CRYS ER 10 MEQ PO TBCR
20.0000 meq | EXTENDED_RELEASE_TABLET | Freq: Every day | ORAL | Status: DC
  Administered 2018-05-27 – 2018-06-10 (×15): 20 meq via ORAL
  Filled 2018-05-27 (×20): qty 2

## 2018-05-27 MED ORDER — ROSUVASTATIN CALCIUM 10 MG PO TABS
10.0000 mg | ORAL_TABLET | Freq: Every day | ORAL | Status: DC
  Administered 2018-05-27 – 2018-06-09 (×14): 10 mg via ORAL
  Filled 2018-05-27 (×14): qty 1

## 2018-05-27 MED ORDER — INSULIN DETEMIR 100 UNIT/ML ~~LOC~~ SOLN
15.0000 [IU] | Freq: Two times a day (BID) | SUBCUTANEOUS | Status: DC
  Administered 2018-05-27 – 2018-06-10 (×27): 15 [IU] via SUBCUTANEOUS
  Filled 2018-05-27 (×29): qty 0.15

## 2018-05-27 MED ORDER — ARIPIPRAZOLE 5 MG PO TABS
15.0000 mg | ORAL_TABLET | Freq: Every day | ORAL | Status: DC
  Administered 2018-05-27 – 2018-06-09 (×14): 15 mg via ORAL
  Filled 2018-05-27: qty 8
  Filled 2018-05-27: qty 3
  Filled 2018-05-27 (×12): qty 8

## 2018-05-27 MED ORDER — AMOXICILLIN-POT CLAVULANATE 875-125 MG PO TABS
1.0000 | ORAL_TABLET | Freq: Two times a day (BID) | ORAL | Status: DC
  Administered 2018-05-27 – 2018-05-28 (×3): 1 via ORAL
  Filled 2018-05-27 (×5): qty 1

## 2018-05-27 MED ORDER — HYDRALAZINE HCL 20 MG/ML IJ SOLN
INTRAMUSCULAR | Status: DC | PRN
  Administered 2018-05-27 (×2): 10 mg via INTRAVENOUS

## 2018-05-27 MED ORDER — SODIUM CHLORIDE 0.9% FLUSH
3.0000 mL | Freq: Two times a day (BID) | INTRAVENOUS | Status: DC
  Administered 2018-05-27 – 2018-06-10 (×19): 3 mL via INTRAVENOUS

## 2018-05-27 MED ORDER — SODIUM CHLORIDE 0.9 % IV SOLN: 250.0000 mL | INTRAVENOUS | Status: DC | PRN

## 2018-05-27 MED ORDER — HYDRALAZINE HCL 20 MG/ML IJ SOLN
INTRAMUSCULAR | Status: AC
  Filled 2018-05-27: qty 1

## 2018-05-27 MED ORDER — HEPARIN SODIUM (PORCINE) 1000 UNIT/ML IJ SOLN
INTRAMUSCULAR | Status: DC | PRN
  Administered 2018-05-27: 2000 [IU] via INTRAVENOUS
  Administered 2018-05-27: 4000 [IU] via INTRAVENOUS
  Administered 2018-05-27: 9000 [IU] via INTRAVENOUS

## 2018-05-27 MED ORDER — ACETAMINOPHEN 325 MG PO TABS: 650.0000 mg | ORAL_TABLET | ORAL | Status: DC | PRN

## 2018-05-27 MED ORDER — PENTOXIFYLLINE ER 400 MG PO TBCR
400.0000 mg | EXTENDED_RELEASE_TABLET | Freq: Three times a day (TID) | ORAL | Status: DC
  Administered 2018-05-27 – 2018-06-10 (×36): 400 mg via ORAL
  Filled 2018-05-27 (×46): qty 1

## 2018-05-27 MED ORDER — LABETALOL HCL 5 MG/ML IV SOLN
INTRAVENOUS | Status: AC
  Filled 2018-05-27: qty 4

## 2018-05-27 MED ORDER — MIDAZOLAM HCL 2 MG/2ML IJ SOLN
INTRAMUSCULAR | Status: AC
  Filled 2018-05-27: qty 2

## 2018-05-27 MED ORDER — SODIUM CHLORIDE 0.9% FLUSH: 3.0000 mL | INTRAVENOUS | Status: DC | PRN

## 2018-05-27 MED ORDER — CLONIDINE HCL 0.2 MG PO TABS
0.3000 mg | ORAL_TABLET | Freq: Three times a day (TID) | ORAL | Status: DC
  Administered 2018-05-27 – 2018-06-10 (×41): 0.3 mg via ORAL
  Filled 2018-05-27 (×41): qty 1

## 2018-05-27 SURGICAL SUPPLY — 32 items
BALL STERLING OTW 2.5X150X150 (BALLOONS) ×1
BALLN STERLING OTW 2.5X150X150 (BALLOONS) ×1
BALLN STERLING OTW 3X220X150 (BALLOONS) ×2
BALLOON STERLING OTW 3X220X150 (BALLOONS) IMPLANT
BALLOON STRLNG OTW 2.5X150X150 (BALLOONS) IMPLANT
CATH CXI 2.6F 65 ST (CATHETERS) ×2
CATH OMNI FLUSH 5F 65CM (CATHETERS) ×1 IMPLANT
CATH QUICKCROSS ANG SELECT (CATHETERS) ×1 IMPLANT
CATH SOFT-VU ST 4F 90CM (CATHETERS) ×1 IMPLANT
CATH SPRT STRG 65X2.6FR BRD (CATHETERS) IMPLANT
DEVICE CLOSURE MYNXGRIP 6/7F (Vascular Products) ×1 IMPLANT
DEVICE ONE SNARE 10MM (MISCELLANEOUS) ×1 IMPLANT
DEVICE TORQUE .025-.038 (MISCELLANEOUS) ×1 IMPLANT
FILTER CO2 0.2 MICRON (VASCULAR PRODUCTS) ×1 IMPLANT
GUIDEWIRE ANGLED .035X260CM (WIRE) ×1 IMPLANT
KIT ENCORE 26 ADVANTAGE (KITS) ×1 IMPLANT
KIT MICROPUNCTURE NIT STIFF (SHEATH) ×1 IMPLANT
KIT PV (KITS) ×2 IMPLANT
RESERVOIR CO2 (VASCULAR PRODUCTS) ×1 IMPLANT
SET FLUSH CO2 (MISCELLANEOUS) ×1 IMPLANT
SHEATH HIGHFLEX ANSEL 6FRX55 (SHEATH) ×1 IMPLANT
SHEATH MICROPUNCTURE PEDAL 4FR (SHEATH) ×1 IMPLANT
SHEATH PINNACLE 5F 10CM (SHEATH) ×1 IMPLANT
SHEATH PINNACLE 6F 10CM (SHEATH) ×1 IMPLANT
STOPCOCK MORSE 400PSI 3WAY (MISCELLANEOUS) ×1 IMPLANT
SYR MEDRAD MARK V 150ML (SYRINGE) ×2 IMPLANT
TRANSDUCER W/STOPCOCK (MISCELLANEOUS) ×2 IMPLANT
TRAY PV CATH (CUSTOM PROCEDURE TRAY) ×2 IMPLANT
WIRE BENTSON .035X145CM (WIRE) ×1 IMPLANT
WIRE G V18X300CM (WIRE) ×1 IMPLANT
WIRE ROSEN-J .035X260CM (WIRE) ×1 IMPLANT
WIRE SPARTACORE .014X300CM (WIRE) ×1 IMPLANT

## 2018-05-27 NOTE — H&P (Signed)
   History and Physical Update  The patient was interviewed and re-examined.  The patient's previous History and Physical has been reviewed and is unchanged from office visit last Friday. Plan for evaluation of left lower extremity with possible intervention and admission for amputation tomorrow.    Chrishelle Zito C. Donzetta Matters, MD [facility] Office: [phone] Pager: [phone]  05/27/2018, 8:39 AM

## 2018-05-27 NOTE — Op Note (Signed)
[name] MRN: [id number] DOB: [birth year] Sex: female  05/27/2018 Pre-operative Diagnosis: Critical left lower extremity ischemia with wound Post-operative diagnosis:  Same Surgeon:  Erlene Quan C. Donzetta Matters, MD Procedure Performed: 1.  Ultrasound-guided cannulation right common femoral artery 2.  CO2 aortogram with left lower extremity angiogram using CO2 and contrast 3.  Ultrasound-guided cannulation left dorsalis pedis artery 4.  Balloon angioplasty of left anterior tibial artery with 2.5 mm balloon 5.  Moderate sedation with fentanyl and Versed for 96-minute 6.  Minx device closure right common femoral artery cannulation site  Indications: 47 year old female has a left foot wound that has failed to heal.  She is now indicated for angiogram possible intervention.  Findings: CO2 aortogram demonstrated no flow-limiting stenosis.  Right leg was not evaluated given history of chronic kidney disease.  She was taken to the level of the below-knee popliteal artery for then she had significant tibial disease.  There is no discernible peroneal artery.  PT artery at the initial takeoff then dissipates reconstitutes above the ankle and then again occludes and there is a lateral plantar vessel only.  The anterior tibial artery also occludes after the takeoff reconstitutes just above the ankle and this feels the arch is a healthy artery.  We could not get through antegrade so we stuck in a retrograde fashion were able to get through after balloon angioplasty there is no further stenosis and no flow-limiting dissection there is a palpable pulse at the level of the foot.   Procedure:  The patient was identified in the holding area and taken to room 8.  The patient was then placed supine on the table and prepped and draped in the usual sterile fashion.  A time out was called.  Ultrasound was used to evaluate the right common femoral artery which was noted to be patent.  The area was anesthetized  1% lidocaine cannulated with direct ultrasound visualization and an image was saved the permanent record.  We placed a micropuncture sheath followed by wire and 5 French sheath.  We then placed an Omni Flush catheter in the abdomen performed CO2 aortogram.  We then crossed the bifurcation with a Glidewire and Omni catheter.  CO2 angiogram was performed to the level of the popliteal artery that did appear to be tibial disease but we could not first see any further.  We then exchanged for a long straight catheter performed angiogram with contrast demonstrating the above-noted tibial disease.  We then placed a Rosen wire exchanged for a long 6 French sheath space was fully heparinized.  We checked a CT we did have to re-heparinized several times throughout this case to achieve an therapeutic level.  We then used a V 18 wire and quick cross select catheter to attempt to traverse the anterior tibial artery but could not.  We then evaluated the anterior tibial artery distally down to the dorsalis pedis artery which was noted to be patent.  An ultrasound image was saved the permanent record.  We anesthetized the area with 1% lidocaine cannulated micropuncture needle followed by wire and a 4 French micro sheath.  Using a CXI catheter and V 18 wire we are able to cross the anterior tibial artery retrograde.  We then snared the 18 wire and obtain through and through access.  We then balloon the anterior tibial artery with 2-1/2 mm balloon.  Completion demonstrated improvement though everything appeared very diminutive.  We administered nitroglycerin 200 mcg total.  This demonstrated much improved  flow and there was no palpable anterior tibial pulse of the ankle.  We then placed an 014 wire down across the dorsalis pedis artery.  We then reinflated our balloon to 3 atm and held pressure for 3 minutes.  Completion angiogram demonstrated flow all the way to the foot there was no extravasation of contrast of the foot.  Satisfied  with this we had removed our sheath from the foot prior.  We now exchanged for short 6 French sheath in the groin and deployed a minx device was deployed well.  Pressure was held.  Patient tolerated procedure without immediate complication.  Contrast 60 cc   [name] C. Donzetta Matters, MD [facility] [place] Office: [phone] Pager: [phone]

## 2018-05-27 NOTE — Progress Notes (Signed)
Pt received from cath lab. VSS. RG level 1 with some soft bruising. Telemetry applied. CHG complete. Dinner tray ordered. Will continue to monitor.  Clyde Canterbury, RN

## 2018-05-28 ENCOUNTER — Inpatient Hospital Stay (HOSPITAL_COMMUNITY): Admission: RE | Admit: 2018-05-28 | Payer: Medicare Other | Source: Ambulatory Visit | Admitting: Vascular Surgery

## 2018-05-28 ENCOUNTER — Inpatient Hospital Stay (HOSPITAL_COMMUNITY): Payer: Medicare Other | Admitting: Certified Registered"

## 2018-05-28 ENCOUNTER — Encounter (HOSPITAL_COMMUNITY)
Admission: RE | Disposition: A | Discharge status: Skilled Nursing Facility | Payer: Self-pay | Source: Ambulatory Visit | Attending: Vascular Surgery

## 2018-05-28 ENCOUNTER — Encounter (HOSPITAL_COMMUNITY): Payer: Self-pay | Admitting: Vascular Surgery

## 2018-05-28 HISTORY — PX: TRANSMETATARSAL AMPUTATION: SHX6197

## 2018-05-28 LAB — CBC
HCT: 28.2 % — ABNORMAL LOW (ref 36.0–46.0)
Hemoglobin: 8.7 g/dL — ABNORMAL LOW (ref 12.0–15.0)
MCH: 22 pg — ABNORMAL LOW (ref 26.0–34.0)
MCHC: 30.9 g/dL (ref 30.0–36.0)
MCV: 71.4 fL — AB (ref 78.0–100.0)
PLATELETS: 306 10*3/uL (ref 150–400)
RBC: 3.95 MIL/uL (ref 3.87–5.11)
RDW: 15.7 % — AB (ref 11.5–15.5)
WBC: 9.3 10*3/uL (ref 4.0–10.5)

## 2018-05-28 LAB — GLUCOSE, CAPILLARY
Glucose-Capillary: 122 mg/dL — ABNORMAL HIGH (ref 70–99)
Glucose-Capillary: 140 mg/dL — ABNORMAL HIGH (ref 70–99)
Glucose-Capillary: 132 mg/dL — ABNORMAL HIGH (ref 70–99)

## 2018-05-28 LAB — BASIC METABOLIC PANEL
Anion gap: 11 (ref 5–15)
BUN: 10 mg/dL (ref 6–20)
CALCIUM: 9.1 mg/dL (ref 8.9–10.3)
CHLORIDE: 103 mmol/L (ref 98–111)
CO2: 27 mmol/L (ref 22–32)
CREATININE: 1.21 mg/dL — AB (ref 0.44–1.00)
GFR calc non Af Amer: 53 mL/min — ABNORMAL LOW (ref >60)
GLUCOSE: 142 mg/dL — AB (ref 70–99)
Potassium: 3.1 mmol/L — ABNORMAL LOW (ref 3.5–5.1)
Sodium: 141 mmol/L (ref 135–145)

## 2018-05-28 LAB — SURGICAL PCR SCREEN
MRSA, PCR: POSITIVE — AB
Staphylococcus aureus: POSITIVE — AB

## 2018-05-28 SURGERY — AMPUTATION, FOOT, TRANSMETATARSAL
Anesthesia: General | Site: Foot | Laterality: Left

## 2018-05-28 MED ORDER — ONDANSETRON HCL 4 MG/2ML IJ SOLN
INTRAMUSCULAR | Status: DC | PRN
  Administered 2018-05-28: 4 mg via INTRAVENOUS

## 2018-05-28 MED ORDER — HYDROMORPHONE HCL 1 MG/ML IJ SOLN
INTRAMUSCULAR | Status: AC
  Administered 2018-05-28: 0.5 mg via INTRAVENOUS
  Filled 2018-05-28: qty 1

## 2018-05-28 MED ORDER — MIDAZOLAM HCL 5 MG/5ML IJ SOLN
INTRAMUSCULAR | Status: DC | PRN
  Administered 2018-05-28: 2 mg via INTRAVENOUS

## 2018-05-28 MED ORDER — OXYCODONE HCL 5 MG/5ML PO SOLN: 5.0000 mg | Freq: Once | ORAL | Status: AC | PRN

## 2018-05-28 MED ORDER — CEFAZOLIN SODIUM-DEXTROSE 2-4 GM/100ML-% IV SOLN
2.0000 g | Freq: Once | INTRAVENOUS | Status: AC
  Administered 2018-05-28: 2 g via INTRAVENOUS

## 2018-05-28 MED ORDER — MUPIROCIN 2 % EX OINT
1.0000 "application " | TOPICAL_OINTMENT | Freq: Two times a day (BID) | CUTANEOUS | Status: AC
  Administered 2018-05-28 – 2018-06-01 (×10): 1 via NASAL
  Filled 2018-05-28 (×3): qty 22

## 2018-05-28 MED ORDER — PROMETHAZINE HCL 25 MG/ML IJ SOLN: 6.2500 mg | INTRAMUSCULAR | Status: DC | PRN

## 2018-05-28 MED ORDER — OXYCODONE HCL 5 MG PO TABS
5.0000 mg | ORAL_TABLET | Freq: Once | ORAL | Status: AC | PRN
  Administered 2018-05-28: 5 mg via ORAL

## 2018-05-28 MED ORDER — LIDOCAINE 2% (20 MG/ML) 5 ML SYRINGE
INTRAMUSCULAR | Status: DC | PRN
  Administered 2018-05-28: 80 mg via INTRAVENOUS

## 2018-05-28 MED ORDER — FENTANYL CITRATE (PF) 250 MCG/5ML IJ SOLN
INTRAMUSCULAR | Status: DC | PRN
  Administered 2018-05-28 (×5): 25 ug via INTRAVENOUS
  Administered 2018-05-28: 50 ug via INTRAVENOUS
  Administered 2018-05-28: 25 ug via INTRAVENOUS

## 2018-05-28 MED ORDER — LIDOCAINE-EPINEPHRINE (PF) 1 %-1:200000 IJ SOLN
INTRAMUSCULAR | Status: AC
  Filled 2018-05-28: qty 30

## 2018-05-28 MED ORDER — CEFAZOLIN SODIUM-DEXTROSE 2-4 GM/100ML-% IV SOLN
INTRAVENOUS | Status: AC
  Filled 2018-05-28: qty 100

## 2018-05-28 MED ORDER — HYDROMORPHONE HCL 1 MG/ML IJ SOLN
0.2500 mg | INTRAMUSCULAR | Status: DC | PRN
  Administered 2018-05-28 (×4): 0.5 mg via INTRAVENOUS

## 2018-05-28 MED ORDER — LACTATED RINGERS IV SOLN
INTRAVENOUS | Status: DC
  Administered 2018-05-28 – 2018-06-06 (×2): via INTRAVENOUS

## 2018-05-28 MED ORDER — PROPOFOL 10 MG/ML IV BOLUS
INTRAVENOUS | Status: DC | PRN
  Administered 2018-05-28: 150 mg via INTRAVENOUS

## 2018-05-28 MED ORDER — 0.9 % SODIUM CHLORIDE (POUR BTL) OPTIME
TOPICAL | Status: DC | PRN
  Administered 2018-05-28: 1000 mL

## 2018-05-28 SURGICAL SUPPLY — 36 items
BANDAGE ACE 4X5 VEL STRL LF (GAUZE/BANDAGES/DRESSINGS) ×3 IMPLANT
BLADE AVERAGE 25MMX9MM (BLADE)
BLADE AVERAGE 25X9 (BLADE) ×1 IMPLANT
BLADE SAW SGTL 81X20 HD (BLADE) IMPLANT
BNDG CONFORM 3 STRL LF (GAUZE/BANDAGES/DRESSINGS) IMPLANT
BNDG GAUZE ELAST 4 BULKY (GAUZE/BANDAGES/DRESSINGS) ×3 IMPLANT
CANISTER SUCT 3000ML PPV (MISCELLANEOUS) ×3 IMPLANT
COVER SURGICAL LIGHT HANDLE (MISCELLANEOUS) ×3 IMPLANT
COVER WAND RF STERILE (DRAPES) ×1 IMPLANT
DRAPE EXTREMITY T 121X128X90 (DRAPE) ×2 IMPLANT
DRAPE HALF SHEET 40X57 (DRAPES) ×1 IMPLANT
DRSG EMULSION OIL 3X3 NADH (GAUZE/BANDAGES/DRESSINGS) ×2 IMPLANT
ELECT REM PT RETURN 9FT ADLT (ELECTROSURGICAL) ×3
ELECTRODE REM PT RTRN 9FT ADLT (ELECTROSURGICAL) ×1 IMPLANT
GAUZE SPONGE 4X4 12PLY STRL (GAUZE/BANDAGES/DRESSINGS) ×3 IMPLANT
GLOVE BIO SURGEON STRL SZ7.5 (GLOVE) ×3 IMPLANT
GOWN STRL REUS W/ TWL LRG LVL3 (GOWN DISPOSABLE) ×2 IMPLANT
GOWN STRL REUS W/ TWL XL LVL3 (GOWN DISPOSABLE) ×1 IMPLANT
GOWN STRL REUS W/TWL LRG LVL3 (GOWN DISPOSABLE) ×6
GOWN STRL REUS W/TWL XL LVL3 (GOWN DISPOSABLE) ×3
KIT BASIN OR (CUSTOM PROCEDURE TRAY) ×3 IMPLANT
KIT TURNOVER KIT B (KITS) ×3 IMPLANT
NDL HYPO 25GX1X1/2 BEV (NEEDLE) IMPLANT
NEEDLE HYPO 25GX1X1/2 BEV (NEEDLE) IMPLANT
NS IRRIG 1000ML POUR BTL (IV SOLUTION) ×3 IMPLANT
PACK GENERAL/GYN (CUSTOM PROCEDURE TRAY) ×3 IMPLANT
PAD ARMBOARD 7.5X6 YLW CONV (MISCELLANEOUS) ×6 IMPLANT
SPECIMEN JAR SMALL (MISCELLANEOUS) ×1 IMPLANT
SUT ETHILON 3 0 PS 1 (SUTURE) ×5 IMPLANT
SUT VIC AB 2-0 CT1 27 (SUTURE) ×3
SUT VIC AB 2-0 CT1 TAPERPNT 27 (SUTURE) IMPLANT
SYR CONTROL 10ML LL (SYRINGE) IMPLANT
TOWEL GREEN STERILE (TOWEL DISPOSABLE) ×6 IMPLANT
TOWEL GREEN STERILE FF (TOWEL DISPOSABLE) ×3 IMPLANT
UNDERPAD 30X30 (UNDERPADS AND DIAPERS) ×3 IMPLANT
WATER STERILE IRR 1000ML POUR (IV SOLUTION) ×1 IMPLANT

## 2018-05-28 NOTE — Anesthesia Preprocedure Evaluation (Signed)
Anesthesia Evaluation  Patient identified by MRN, date of birth, ID band Patient awake    Reviewed: Allergy & Precautions, H&P , NPO status , Patient's Chart, lab work & pertinent test results, reviewed documented beta blocker date and time   Airway Mallampati: II  TM Distance: >3 FB Neck ROM: Full    Dental no notable dental hx. (+) Teeth Intact, Dental Advisory Given   Pulmonary sleep apnea and Continuous Positive Airway Pressure Ventilation , COPD, Current Smoker,    Pulmonary exam normal breath sounds clear to auscultation       Cardiovascular hypertension, Pt. on medications  Rhythm:Regular Rate:Normal     Neuro/Psych  Headaches, Anxiety Depression Bipolar Disorder Schizophrenia    GI/Hepatic negative GI ROS, Neg liver ROS,   Endo/Other  diabetes, Type 1, Insulin DependentMorbid obesity  Renal/GU negative Renal ROS  negative genitourinary   Musculoskeletal   Abdominal (+) + obese,   Peds  Hematology negative hematology ROS (+)   Anesthesia Other Findings   Reproductive/Obstetrics negative OB ROS                             Anesthesia Physical  Anesthesia Plan  ASA: III  Anesthesia Plan: General   Post-op Pain Management:    Induction: Intravenous  PONV Risk Score and Plan: 2 and Ondansetron and Midazolam  Airway Management Planned: LMA  Additional Equipment:   Intra-op Plan:   Post-operative Plan: Extubation in OR  Informed Consent: I have reviewed the patients History and Physical, chart, labs and discussed the procedure including the risks, benefits and alternatives for the proposed anesthesia with the patient or authorized representative who has indicated his/her understanding and acceptance.   Dental advisory given  Plan Discussed with: CRNA, Anesthesiologist and Surgeon  Anesthesia Plan Comments:         Anesthesia Quick Evaluation

## 2018-05-28 NOTE — Progress Notes (Signed)
Orthopedic Tech Progress Note Patient Details:  Diamond Rice Jun 13, 1971 [id number]  Ortho Devices Type of Ortho Device: Postop shoe/boot Ortho Device/Splint Interventions: Application   Post Interventions Patient Tolerated: Well Instructions Provided: Care of device   [place] Pink 05/28/2018, 4:47 PM

## 2018-05-28 NOTE — Anesthesia Postprocedure Evaluation (Signed)
Anesthesia Post Note  Patient: Diamond Rice  Procedure(s) Performed: AMPUTATION TOES THREE, FOUR AND FIVE on left foot (Left Foot)     Patient location during evaluation: PACU Anesthesia Type: General Level of consciousness: awake and alert Pain management: pain level controlled Vital Signs Assessment: post-procedure vital signs reviewed and stable Respiratory status: spontaneous breathing, nonlabored ventilation and respiratory function stable Cardiovascular status: blood pressure returned to baseline and stable Postop Assessment: no apparent nausea or vomiting Anesthetic complications: no    Last Vitals:  Vitals:   05/28/18 1150 05/28/18 1215  BP: (!) 154/106 (!) 177/124  Pulse: 72 67  Resp: 13 13  Temp:    SpO2: 100% 100%    Last Pain:  Vitals:   05/28/18 1150  TempSrc:   PainSc: Asleep                 Lynda Rainwater

## 2018-05-28 NOTE — Op Note (Signed)
    [name] MRN: [id number] DOB: April 27, 1971 Sex: female  05/28/2018 Pre-operative Diagnosis: Left foot wound Post-operative diagnosis:  Same Surgeon:  Erlene Quan C. Donzetta Matters, MD Procedure Performed: Amputation left sided toes 3 through 5  Indications: 47 year old female with a long-standing wound on her left foot involving her third and fourth toe with exposed tendon.  She underwent revascularization of her anterior tibial artery yesterday and is now indicated for amputation of at least the third and fourth toe possibly the fifth  Findings: There is an open wound involving all the tendons down to the bone of the third and fourth toes.  The wound cannot be closed without sacrificing the fifth toe.  At completion we did get good apposition of the skin.   Procedure:  The patient was identified in the holding area and taken to the operating room where she was placed supine on operating table and general anesthesia was induced.  She was sterilely prepped and draped in the left lower extremity in the usual fashion, antibiotics were administered, a timeout was called.  We began with incising around the base of the third and fourth toes and extending up to the wound excising down to the level of the bone.  I first removed third and fourth toes together.  Unfortunately wound would not approximate so I then made a tennis racquet type incision around the fifth toe.  I dissected back to the bone transected this with a bone cutter.  I then used a ronjeur to remove the bones back out of the wound and smooth and with a bone rasp.  The wound was heavily irrigated hemostasis obtained.  I closed the deep tissue with interrupted 2-0 Vicryl sutures and the skin with 3-0 nylon.  All counts were correct at completion.  She was allowed away from anesthesia having tolerated procedure without immediate complication.    EBL: 25cc  Conchetta Lamia C. Donzetta Matters, MD [facility] [place] Office:  [phone] Pager: [phone]

## 2018-05-28 NOTE — Transfer of Care (Signed)
Immediate Anesthesia Transfer of Care Note  Patient: Diamond Rice  Procedure(s) Performed: AMPUTATION TOES THREE, FOUR AND FIVE on left foot (Left Foot)  Patient Location: PACU  Anesthesia Type:General  Level of Consciousness: awake, alert , oriented and patient cooperative  Airway & Oxygen Therapy: Patient Spontanous Breathing and Patient connected to nasal cannula oxygen  Post-op Assessment: Report given to RN, Post -op Vital signs reviewed and stable and Patient moving all extremities  Post vital signs: Reviewed and stable  Last Vitals:  Vitals Value Taken Time  BP 143/93 05/28/2018 10:32 AM  Temp    Pulse 96 05/28/2018 10:33 AM  Resp 11 05/28/2018 10:35 AM  SpO2 97 % 05/28/2018 10:33 AM  Vitals shown include unvalidated device data.  Last Pain:  Vitals:   05/28/18 0802  TempSrc: Oral  PainSc:       Patients Stated Pain Goal: 3 (60/67/70 3403)  Complications: No apparent anesthesia complications

## 2018-05-28 NOTE — Progress Notes (Signed)
Pt arrived back to unit from PACU. Dressing to left foot with palpable pulse. Vitals obtained. BP elevated. Will address with meds. Pure-wick hooked back up to suction. Call light within reach. Will continue to monitor.    Ara Kussmaul BSN, RN

## 2018-05-28 NOTE — Progress Notes (Signed)
  Progress Note    05/28/2018 9:09 AM Day of Surgery  Subjective:  No acute issues  Vitals:   05/28/18 0500 05/28/18 0802  BP: (!) 123/91 (!) 140/98  Pulse: 73 77  Resp: 12 19  Temp: 97.7 F (36.5 C) 98.1 F (36.7 C)  SpO2: 100% 100%    Physical Exam: aaox3 Right groin is soft Palpable left dp Dressing left foot cdi  CBC    Component Value Date/Time   WBC 9.3 05/28/2018 0400   RBC 3.95 05/28/2018 0400   HGB 8.7 (L) 05/28/2018 0400   HCT 28.2 (L) 05/28/2018 0400   PLT 306 05/28/2018 0400   MCV 71.4 (L) 05/28/2018 0400   MCH 22.0 (L) 05/28/2018 0400   MCHC 30.9 05/28/2018 0400   RDW 15.7 (H) 05/28/2018 0400   LYMPHSABS 3.9 04/12/2018 0532   MONOABS 0.9 04/12/2018 0532   EOSABS 0.4 04/12/2018 0532   BASOSABS 0.1 04/12/2018 0532    BMET    Component Value Date/Time   NA 141 05/28/2018 0400   K 3.1 (L) 05/28/2018 0400   CL 103 05/28/2018 0400   CO2 27 05/28/2018 0400   GLUCOSE 142 (H) 05/28/2018 0400   BUN 10 05/28/2018 0400   CREATININE 1.21 (H) 05/28/2018 0400   CREATININE 1.81 (H) 05/23/2018 1620   CALCIUM 9.1 05/28/2018 0400   GFRNONAA 53 (L) 05/28/2018 0400   GFRNONAA 54 (L) 09/28/2014 1034   GFRAA >60 05/28/2018 0400   GFRAA 62 09/28/2014 1034    INR    Component Value Date/Time   INR 0.96 04/02/2018 0158     Intake/Output Summary (Last 24 hours) at 05/28/2018 0909 Last data filed at 05/28/2018 0834 Gross per 24 hour  Intake 390 ml  Output 625 ml  Net -235 ml     Assessment:  46 y.o. female is s/p left AT revascularization  Plan: OR today for left toes 3-5 amputation possible tma   Deo Mehringer C. Donzetta Matters, MD [facility] [place] Office: [phone] Pager: [phone]  05/28/2018 9:09 AM

## 2018-05-28 NOTE — Anesthesia Procedure Notes (Signed)
Procedure Name: LMA Insertion Date/Time: 05/28/2018 9:37 AM Performed by: Myna Bright, CRNA Pre-anesthesia Checklist: Patient identified, Emergency Drugs available, Suction available and Patient being monitored Patient Re-evaluated:Patient Re-evaluated prior to induction Oxygen Delivery Method: Circle system utilized Preoxygenation: Pre-oxygenation with 100% oxygen Induction Type: IV induction Ventilation: Mask ventilation without difficulty LMA: LMA inserted LMA Size: 4.0 Tube type: Oral Number of attempts: 1 Placement Confirmation: positive ETCO2 and breath sounds checked- equal and bilateral Tube secured with: Tape Dental Injury: Teeth and Oropharynx as per pre-operative assessment

## 2018-05-29 ENCOUNTER — Encounter (HOSPITAL_COMMUNITY): Payer: Self-pay | Admitting: Vascular Surgery

## 2018-05-29 LAB — GLUCOSE, CAPILLARY: Glucose-Capillary: 204 mg/dL — ABNORMAL HIGH (ref 70–99)

## 2018-05-29 MED ORDER — HYDROCHLOROTHIAZIDE 12.5 MG PO CAPS
12.5000 mg | ORAL_CAPSULE | Freq: Every day | ORAL | Status: DC
  Administered 2018-05-29 – 2018-06-10 (×12): 12.5 mg via ORAL
  Filled 2018-05-29 (×13): qty 1

## 2018-05-29 MED ORDER — SODIUM CHLORIDE 0.9 % IV SOLN
INTRAVENOUS | Status: DC
  Administered 2018-05-29 – 2018-06-04 (×9): via INTRAVENOUS

## 2018-05-29 MED ORDER — INSULIN ASPART 100 UNIT/ML ~~LOC~~ SOLN
0.0000 [IU] | Freq: Three times a day (TID) | SUBCUTANEOUS | Status: DC
  Administered 2018-05-30 – 2018-05-31 (×2): 2 [IU] via SUBCUTANEOUS
  Administered 2018-05-31 (×2): 3 [IU] via SUBCUTANEOUS
  Administered 2018-06-01: 2 [IU] via SUBCUTANEOUS
  Administered 2018-06-01: 3 [IU] via SUBCUTANEOUS
  Administered 2018-06-01 – 2018-06-02 (×3): 2 [IU] via SUBCUTANEOUS
  Administered 2018-06-03 – 2018-06-09 (×10): 1 [IU] via SUBCUTANEOUS
  Administered 2018-06-09 – 2018-06-10 (×3): 2 [IU] via SUBCUTANEOUS

## 2018-05-29 MED ORDER — INSULIN ASPART 100 UNIT/ML ~~LOC~~ SOLN
0.0000 [IU] | Freq: Every day | SUBCUTANEOUS | Status: DC
  Administered 2018-05-29: 2 [IU] via SUBCUTANEOUS
  Administered 2018-05-31: 3 [IU] via SUBCUTANEOUS
  Administered 2018-06-01: 2 [IU] via SUBCUTANEOUS

## 2018-05-29 NOTE — Progress Notes (Signed)
Pt has not voided in current shift. Scanned by 1 NT and 2 RNs and didn't scan more than 435ml. Pt denies urge to urinate and desires to sleep. Will continue to follow up.

## 2018-05-29 NOTE — Progress Notes (Signed)
Pt has not voided all day. Pt denies urge to urinate. Bladder scan 585 cc. 700 cc output with in and out cath.    Ara Kussmaul BSN, RN

## 2018-05-29 NOTE — Evaluation (Signed)
Physical Therapy Evaluation Patient Details Name: Diamond Rice MRN: [id number] DOB: April 28, 1971 Today's Date: 05/29/2018   History of Present Illness  47 year old female with a long-standing wound on her left foot involving her third and fourth toe with exposed tendon.  She underwent revascularization of her anterior tibial artery and is now indicated for amputation of third, fourth, and fifth toes on 05/28/18. PMH:  Sleep apnea, COPD, smoker, DM, Bipolar, HTN, anxiety.   Clinical Impression  Pt admitted with above diagnosis. Pt currently with functional limitations due to the deficits listed below (see PT Problem List). Pt was able to stand pivot from recliner to bed with [facility] with mod assist and very limited weight on left LE.  Will need SNF to reach prior functional level and pt agreeable.  82 bpm HR, 93% on RA, 128/84.  Pt will benefit from skilled PT to increase their independence and safety with mobility to allow discharge to the venue listed below.      Follow Up Recommendations SNF;Supervision/Assistance - 24 hour    Equipment Recommendations  Other (comment)(TBA)    Recommendations for Other Services       Precautions / Restrictions Precautions Precautions: Fall Required Braces or Orthoses: Other Brace/Splint Other Brace/Splint: post op shoe left foot Restrictions Weight Bearing Restrictions: Yes LLE Weight Bearing: (weight bear on left heel)      Mobility  Bed Mobility Overal bed mobility: Needs Assistance Bed Mobility: Sit to Supine       Sit to supine: Min assist   General bed mobility comments: Needed assist to lift left LE into bed  Transfers Overall transfer level: Needs assistance Equipment used: Quad cane Transfers: Sit to/from [facility] Sit to Stand: Min assist Stand pivot transfers: Mod assist       General transfer comment: Pt needed cues for hand placement as well as min assist and cues to power up.  Once up, mod assist and mod  cues to take a few pivotal steps to bed with [facility].  Pt with wide BOS and needed postural support as well as cues for moving quad cane and feet to step to bed.    Ambulation/Gait Ambulation/Gait assistance: Mod assist Gait Distance (Feet): 3 Feet Assistive device: Quad cane Gait Pattern/deviations: Step-to pattern;Decreased step length - left;Decreased stance time - left;Decreased weight shift to left;Trunk flexed;Wide base of support;Antalgic   Gait velocity interpretation: <1.31 ft/sec, indicative of household ambulator General Gait Details: Only able to take a few steps to bed and difficulty placing weight on left LE due to pain.    Stairs            Wheelchair Mobility    Modified Rankin (Stroke Patients Only)       Balance Overall balance assessment: Needs assistance Sitting-balance support: No upper extremity supported;Feet supported Sitting balance-Leahy Scale: Fair     Standing balance support: Single extremity supported;During functional activity Standing balance-Leahy Scale: Poor Standing balance comment: relies on single UE support on the quad cane                             Pertinent Vitals/Pain Pain Assessment: Faces Faces Pain Scale: Hurts worst Pain Location: left foot Pain Descriptors / Indicators: Aching;Grimacing;Guarding Pain Intervention(s): Limited activity within patient's tolerance;Monitored during session;Premedicated before session;Repositioned    Home Living Family/patient expects to be discharged to:: Private residence Living Arrangements: Non-relatives/Friends Available Help at Discharge: Friend(s);Available 24 hours/day Type of Home: [place]  Access: Level entry     Home Layout: One level Home Equipment: [place] - 4 wheels;Cane - quad Additional Comments: using chair in shower but states recently she couldn't get in because she could not lift her leg in and she was sponge bathing, reports vanity beside commode     Prior Function Level of Independence: Independent with assistive device(s)         Comments: struggling at baseline, sometimes did not make it to bathroom, has been using walker or cane since stroke stating she used cane indoors and rollator outdoors, L foot pain since June when she had a fall     Hand Dominance   Dominant Hand: Right    Extremity/Trunk Assessment   Upper Extremity Assessment Upper Extremity Assessment: Defer to OT evaluation    Lower Extremity Assessment Lower Extremity Assessment: LLE deficits/detail LLE: Unable to fully assess due to pain    Cervical / Trunk Assessment Cervical / Trunk Assessment: Kyphotic  Communication   Communication: No difficulties  Cognition Arousal/Alertness: Lethargic;Suspect due to medications Behavior During Therapy: Flat affect Overall Cognitive Status: Within Functional Limits for tasks assessed                                        General Comments      Exercises     Assessment/Plan    PT Assessment Patient needs continued PT services  PT Problem List Decreased activity tolerance;Decreased balance;Decreased mobility;Decreased knowledge of use of DME;Decreased safety awareness;Decreased knowledge of precautions;Pain       PT Treatment Interventions DME instruction;Gait training;Functional mobility training;Therapeutic activities;Therapeutic exercise;Balance training;Patient/family education    PT Goals (Current goals can be found in the Care Plan section)  Acute Rehab PT Goals Patient Stated Goal: to go to Rehab to get stronger PT Goal Formulation: With patient Time For Goal Achievement: 06/12/18 Potential to Achieve Goals: Good    Frequency Min 3X/week   Barriers to discharge Decreased caregiver support      Co-evaluation               AM-PAC PT "6 Clicks" Daily Activity  Outcome Measure Difficulty turning over in bed (including adjusting bedclothes, sheets and blankets)?:  Unable Difficulty moving from lying on back to sitting on the side of the bed? : Unable Difficulty sitting down on and standing up from a chair with arms (e.g., wheelchair, bedside commode, etc,.)?: A Little Help needed moving to and from a bed to chair (including a wheelchair)?: A Lot Help needed walking in hospital room?: A Lot Help needed climbing 3-5 steps with a railing? : Total 6 Click Score: 10    End of Session Equipment Utilized During Treatment: Gait belt Activity Tolerance: Patient limited by fatigue;Patient limited by pain Patient left: with call bell/phone within reach;in bed Nurse Communication: Mobility status PT Visit Diagnosis: Unsteadiness on feet (R26.81);Muscle weakness (generalized) (M62.81);Pain Pain - Right/Left: Left Pain - part of body: Ankle and joints of foot    Time: 5726-2035 PT Time Calculation (min) (ACUTE ONLY): 26 min   Charges:   PT Evaluation $PT Eval Moderate Complexity: 1 Mod PT Treatments $Therapeutic Activity: 8-22 mins        Delmy Holdren,PT Acute Rehabilitation Services Pager:  [phone]  Office:  [phone]    Denice Paradise 05/29/2018, 12:21 PM

## 2018-05-29 NOTE — Progress Notes (Addendum)
[facility] [place]  Subjective  - Very sleepy last dose of IV dilaudid was yesterday.   Objective 120/78 88 98.1 F (36.7 C) (Oral) 18 100%  Intake/Output Summary (Last 24 hours) at 05/29/2018 0743 Last data filed at 05/28/2018 1700 Gross per 24 hour  Intake 780 ml  Output 355 ml  Net 425 ml    Doppler brisk left DP and palpable Left amputation site clean and dry dry dressing re applied Heart RRR Lungs non labored breathing    Assessment/Planning: POD # 1 Amputation left sided toes 3 through 5 05/27/2018 angiogram with CO2 history of renal disease Cr 1.21 Balloon angioplasty of left anterior tibial artery with 2.5 mm balloon  IV fluids slowly to assit with voiding and have her get up and move around today with nursing and PT.  Heel weight bearing in post op shoe left LE Elevate the left foot when at rest.  Bladder scan in 4 hours if urine greater than 500 in and out cath    Roxy Horseman 05/29/2018 7:43 AM --  Laboratory Lab Results: Recent Labs    05/27/18 0843 05/28/18 0400  WBC  --  9.3  HGB 9.9* 8.7*  HCT 29.0* 28.2*  PLT  --  306   BMET Recent Labs    05/27/18 0843 05/28/18 0400  NA 136 141  K 3.5 3.1*  CL 99 103  CO2  --  27  GLUCOSE 180* 142*  BUN 15 10  CREATININE 1.20* 1.21*  CALCIUM  --  9.1    COAG Lab Results  Component Value Date   INR 0.96 04/02/2018   INR 0.98 01/13/2018   INR 0.98 11/18/2012   No results found for: PTT   I have interviewed and examined patient with PA and agree with assessment and plan above.  To amputation site appears viable there is a palpable dorsalis pedis pulse.  Restart IV fluids check BMP.  Brandon C. Donzetta Matters, MD [facility] [place] Office: [phone] Pager: [phone]

## 2018-05-30 ENCOUNTER — Inpatient Hospital Stay (HOSPITAL_COMMUNITY): Payer: Medicare Other

## 2018-05-30 DIAGNOSIS — Z9889 Other specified postprocedural states: Secondary | ICD-10-CM

## 2018-05-30 LAB — GLUCOSE, CAPILLARY
GLUCOSE-CAPILLARY: 175 mg/dL — AB (ref 70–99)
GLUCOSE-CAPILLARY: 173 mg/dL — AB (ref 70–99)
Glucose-Capillary: 119 mg/dL — ABNORMAL HIGH (ref 70–99)
Glucose-Capillary: 114 mg/dL — ABNORMAL HIGH (ref 70–99)

## 2018-05-30 LAB — BASIC METABOLIC PANEL
Anion gap: 8 (ref 5–15)
BUN: 12 mg/dL (ref 6–20)
CALCIUM: 7.6 mg/dL — AB (ref 8.9–10.3)
CO2: 21 mmol/L — ABNORMAL LOW (ref 22–32)
Chloride: 108 mmol/L (ref 98–111)
Creatinine, Ser: 1.07 mg/dL — ABNORMAL HIGH (ref 0.44–1.00)
GFR calc Af Amer: >60 mL/min (ref >60)
GFR calc non Af Amer: >60 mL/min (ref >60)
Glucose, Bld: 98 mg/dL (ref 70–99)
Potassium: 3.3 mmol/L — ABNORMAL LOW (ref 3.5–5.1)
Sodium: 137 mmol/L (ref 135–145)

## 2018-05-30 LAB — CBC WITH DIFFERENTIAL/PLATELET
Abs Immature Granulocytes: 0.1 10*3/uL (ref 0.0–0.1)
Basophils Absolute: 0 10*3/uL (ref 0.0–0.1)
Basophils Relative: 0 %
EOS ABS: 0.4 10*3/uL (ref 0.0–0.7)
EOS PCT: 5 %
HEMATOCRIT: 20.6 % — AB (ref 36.0–46.0)
Hemoglobin: 6.5 g/dL — CL (ref 12.0–15.0)
Immature Granulocytes: 1 %
LYMPHS ABS: 2.8 10*3/uL (ref 0.7–4.0)
LYMPHS PCT: 31 %
MCH: 22.5 pg — ABNORMAL LOW (ref 26.0–34.0)
MCHC: 31.6 g/dL (ref 30.0–36.0)
MCV: 71.3 fL — AB (ref 78.0–100.0)
MONO ABS: 1.2 10*3/uL — AB (ref 0.1–1.0)
MONOS PCT: 13 %
Neutro Abs: 4.7 10*3/uL (ref 1.7–7.7)
Neutrophils Relative %: 50 %
Platelets: 216 10*3/uL (ref 150–400)
RBC: 2.89 MIL/uL — AB (ref 3.87–5.11)
RDW: 15.6 % — AB (ref 11.5–15.5)
WBC: 9.2 10*3/uL (ref 4.0–10.5)

## 2018-05-30 LAB — ABO/RH: ABO/RH(D): A POS

## 2018-05-30 LAB — PREPARE RBC (CROSSMATCH)

## 2018-05-30 MED ORDER — SODIUM CHLORIDE 0.9% IV SOLUTION
Freq: Once | INTRAVENOUS | Status: AC
  Administered 2018-05-30: 13:00:00 via INTRAVENOUS

## 2018-05-30 NOTE — Progress Notes (Signed)
Pt placed on CPAP for night time rest. Tolerating well. 

## 2018-05-30 NOTE — NC FL2 (Signed)
[place] LEVEL OF CARE SCREENING TOOL     IDENTIFICATION  [name] Birthdate: [birth year] Sex: female Admission Date (Current Location): 05/27/2018  [facility] and [place] Number:  Herbalist and Address:  [facility], [place] [address] [place], [place] [address]      Provider Number: [id number]  Attending Physician Name and Address:  [place] Name and Phone Number:       Current Level of Care: Hospital Recommended Level of Care: [place] Prior Approval Number:    Date Approved/Denied:   PASRR Number: pending  Discharge Plan: SNF    Current Diagnoses: Patient Active Problem List   Diagnosis Date Noted  . Critical lower limb ischemia 05/27/2018  . GI bleeding 04/02/2018  . Acute renal failure (ARF) ([place]) 04/02/2018  . Hypokalemia 04/02/2018  . Polysubstance abuse ([place]) 04/02/2018  . Diabetic foot ulcer ([place]) 04/02/2018  . Cannabis use disorder, moderate, dependence ([place]) 05/19/2017  . Cocaine use disorder, moderate, dependence ([place]) 05/19/2017  . Schizoaffective disorder, bipolar type ([place]) 10/07/2015  . OSA (obstructive sleep apnea) 06/09/2015  . Vitamin D deficiency 01/06/2015  . Bilateral knee pain 01/05/2015  . Numbness and tingling in right hand 01/05/2015  . Insomnia 12/07/2014  . Right-sided lacunar infarction ([place]) 09/15/2014  . Left hemiparesis ([place]) 09/15/2014  . Dyspnea   . Back pain 09/11/2013  . Psychoactive substance-induced organic mood disorder ([place]) 05/28/2013  . Cocaine abuse with cocaine-induced mood disorder ([place]) 05/28/2013  . Cannabis dependence with cannabis-induced anxiety disorder ([place]) 05/28/2013  . Morbid obesity with BMI of 45.0-49.9, adult ([place]) 04/25/2012  . Chest pain 04/17/2012  . Hypertension 04/17/2012  . Diabetes mellitus type 2 with complications, uncontrolled ([place]) 04/17/2012  . Hyperlipidemia 04/17/2012  . Tobacco  use 04/17/2012    Orientation RESPIRATION BLADDER Height & Weight     Self, Time, Situation, Place  Normal External catheter, Continent Weight: 190 lb (86.2 kg) Height:  5\' 1"  (154.9 cm)  BEHAVIORAL SYMPTOMS/MOOD NEUROLOGICAL BOWEL NUTRITION STATUS      Continent Diet(regular)  AMBULATORY STATUS COMMUNICATION OF NEEDS Skin   Limited Assist Verbally Surgical wounds(toe amputation )                       Personal Care Assistance Level of Assistance  Bathing, Feeding, Dressing Bathing Assistance: Limited assistance Feeding assistance: Independent Dressing Assistance: Limited assistance     Functional Limitations Info  Sight, Hearing, Speech Sight Info: Adequate Hearing Info: Adequate Speech Info: Adequate    SPECIAL CARE FACTORS FREQUENCY  PT (By licensed PT), OT (By licensed OT)     PT Frequency: 5x wk OT Frequency: 5x wk            Contractures Contractures Info: Not present    Additional Factors Info  Allergies, Code Status Code Status Info: Full Code Allergies Info: MORPHINE AND RELATED, METFORMIN           Current Medications (05/30/2018):  This is the current hospital active medication list Current Facility-Administered Medications  Medication Dose Route Frequency Provider Last Rate Last Dose  . 0.9 %  sodium chloride infusion  250 mL Intravenous PRN Waynetta Sandy, MD      . 0.9 %  sodium chloride infusion   Intravenous Continuous Ulyses Amor, PA-C 100 mL/hr at 05/30/18 1230    . acetaminophen (TYLENOL) tablet 650 mg  650 mg Oral Q4H PRN Servando Snare  Harrell Gave, MD      . ARIPiprazole (ABILIFY) tablet 15 mg  15 mg Oral QHS Waynetta Sandy, MD   15 mg at 05/29/18 2237  . aspirin EC tablet 81 mg  81 mg Oral Daily Waynetta Sandy, MD   81 mg at 05/30/18 0816  . cloNIDine (CATAPRES) tablet 0.3 mg  0.3 mg Oral TID Waynetta Sandy, MD   0.3 mg at 05/30/18 0815  . clopidogrel (PLAVIX) tablet 75 mg  75 mg Oral Q  breakfast Waynetta Sandy, MD   75 mg at 05/30/18 0816  . gabapentin (NEURONTIN) capsule 100 mg  100 mg Oral TID Waynetta Sandy, MD   100 mg at 05/30/18 0816  . heparin injection 5,000 Units  5,000 Units Subcutaneous Q8H Waynetta Sandy, MD   5,000 Units at 05/30/18 8182  . hydrALAZINE (APRESOLINE) injection 5 mg  5 mg Intravenous Q20 Min PRN Waynetta Sandy, MD   5 mg at 05/28/18 1236  . hydrALAZINE (APRESOLINE) tablet 50 mg  50 mg Oral Q8H Waynetta Sandy, MD   50 mg at 05/30/18 0816  . hydrochlorothiazide (MICROZIDE) capsule 12.5 mg  12.5 mg Oral Daily Laurence Slate M, PA-C   12.5 mg at 05/30/18 0816  . insulin aspart (novoLOG) injection 0-5 Units  0-5 Units Subcutaneous QHS Waynetta Sandy, MD   2 Units at 05/29/18 2228  . insulin aspart (novoLOG) injection 0-9 Units  0-9 Units Subcutaneous TID WC Waynetta Sandy, MD      . insulin detemir (LEVEMIR) injection 15 Units  15 Units Subcutaneous BID Waynetta Sandy, MD   15 Units at 05/30/18 0816  . labetalol (NORMODYNE,TRANDATE) injection 10 mg  10 mg Intravenous Q10 min PRN Waynetta Sandy, MD   10 mg at 05/28/18 1406  . lactated ringers infusion   Intravenous Continuous Waynetta Sandy, MD   Stopped at 05/28/18 1300  . lisinopril (PRINIVIL,ZESTRIL) tablet 20 mg  20 mg Oral Daily Waynetta Sandy, MD   20 mg at 05/30/18 0816  . mupirocin ointment (BACTROBAN) 2 % 1 application  1 application Nasal BID Waynetta Sandy, MD   1 application at 99/37/16 [phone]  . ondansetron (ZOFRAN) injection 4 mg  4 mg Intravenous Q6H PRN Waynetta Sandy, MD   4 mg at 05/27/18 1712  . oxyCODONE (Oxy IR/ROXICODONE) immediate release tablet 5-10 mg  5-10 mg Oral Q4H PRN Waynetta Sandy, MD   10 mg at 05/30/18 1256  . pentoxifylline (TRENTAL) CR tablet 400 mg  400 mg Oral TID WC Waynetta Sandy, MD   400 mg at 05/30/18 1005  .  potassium chloride (K-DUR) CR tablet 20 mEq  20 mEq Oral Daily Waynetta Sandy, MD   20 mEq at 05/30/18 0815  . rosuvastatin (CRESTOR) tablet 10 mg  10 mg Oral q1800 Waynetta Sandy, MD   10 mg at 05/29/18 1653  . sodium chloride flush (NS) 0.9 % injection 3 mL  3 mL Intravenous Q12H Waynetta Sandy, MD   3 mL at 05/29/18 0919  . sodium chloride flush (NS) 0.9 % injection 3 mL  3 mL Intravenous PRN Waynetta Sandy, MD         Discharge Medications: Please see discharge summary for a list of discharge medications.  Relevant Imaging Results:  Relevant Lab Results:   Additional Information [id number]  Wende Neighbors, LCSW

## 2018-05-30 NOTE — Progress Notes (Signed)
PT Cancellation Note  Patient Details Name: Diamond Rice MRN: [id number] DOB: [birth year]   Cancelled Treatment:    Reason Eval/Treat Not Completed: Other (comment)(Refused to get OOB.  Nursing notified.  Will return as able.)   Denice Paradise 05/30/2018, 11:11 AM Avantae Bither,PT Acute Rehabilitation Services Pager:  [phone]  Office:  [phone]

## 2018-05-30 NOTE — Clinical Social Work Note (Signed)
Clinical Social Work Assessment  Patient Details  Name: Diamond Rice MRN: [id number] Date of Birth: Jan 10, 1971  Date of referral:  05/30/18               Reason for consult:  Discharge Planning, Facility Placement                Permission sought to share information with:  Family Supports Permission granted to share information::  Yes, Verbal Permission Granted  Name::     Diamond Rice  Agency::  snf  Relationship::  brother  Contact Information:  [phone]  Housing/Transportation Living arrangements for the past 2 months:  [name] of Information:  Patient Patient Interpreter Needed:  None Criminal Activity/Legal Involvement Pertinent to Current Situation/Hospitalization:  No - Comment as needed Significant Relationships:  Friend, Siblings Lives with:  Friends Do you feel safe going back to the place where you live?  Yes Need for family participation in patient care:  Yes (Comment)  Care giving concerns:  No family at bedside. Patient lives with a friend and stated that friend is in and out of the house.   Social Worker assessment / plan:  CSW met patient at bedside to discuss discharge plans. Patient stated that she would like to go to rehab after discharge. Patient stated she has been to a rehab facility in the past and is aware of the process of SNF. Patient requested CSW fax her out to the surrounding area. CSW to follow up with patient once bed offers are available.  Employment status:  Disabled (Comment on whether or not currently receiving Disability) Insurance information:  Medicare PT Recommendations:  [place] / Referral to community resources:  [place]  Patient/Family's Response to care:  Patient appreciative of CSW role in care  Patient/Family's Understanding of and Emotional Response to Diagnosis, Current Treatment, and Prognosis:  Patient agreeable with discharge plan to a snf  Emotional  Assessment Appearance:  Appears stated age Attitude/Demeanor/Rapport:  Engaged Affect (typically observed):  Accepting, Pleasant Orientation:  Oriented to Self, Oriented to Place, Oriented to  Time, Oriented to Situation Alcohol / Substance use:  Not Applicable Psych involvement (Current and /or in the community):  No (Comment)  Discharge Needs  Concerns to be addressed:  Care Coordination Readmission within the last 30 days:  No Current discharge risk:  Dependent with Mobility Barriers to Discharge:  Continued Medical Work up   [employer], LCSW 05/30/2018, 12:20 PM

## 2018-05-30 NOTE — Progress Notes (Signed)
CRITICAL VALUE ALERT  Critical Value:  HGB 6.5  Date & Time Notied:  10/3 9:20 AM  Provider Notified: Theda Sers, PA  Orders Received/Actions taken: Paged, awaiting orders

## 2018-05-30 NOTE — Progress Notes (Signed)
Blood initially started at 1:30 pm. IV blew, IV team came and started new IV. Blood resumed at 2:30 pm.

## 2018-05-30 NOTE — Progress Notes (Signed)
VASCULAR LAB PRELIMINARY  ARTERIAL  ABI completed:    RIGHT    LEFT    PRESSURE WAVEFORM  PRESSURE WAVEFORM  BRACHIAL 136 triphasic BRACHIAL 149 triphasic  DP 99 triphasic DP 88 triphasic         PT 155 triphasic PT 121 triphasic    RIGHT LEFT  ABI 1.04 0.81   Rt ABI is within normal range.  Lt ABI indicates mild left lower extremity arterial disease.   Diamond Rice 05/30/2018, 11:27 AM

## 2018-05-31 ENCOUNTER — Telehealth: Payer: Self-pay | Admitting: Vascular Surgery

## 2018-05-31 LAB — BPAM RBC
BLOOD PRODUCT EXPIRATION DATE: 201910172359
Blood Product Expiration Date: 201910102359
ISSUE DATE / TIME: 201910031958
ISSUE DATE / TIME: 201910031317
UNIT TYPE AND RH: 6200
Unit Type and Rh: 6200

## 2018-05-31 LAB — TYPE AND SCREEN
ABO/RH(D): A POS
ANTIBODY SCREEN: NEGATIVE
Unit division: 0
Unit division: 0

## 2018-05-31 LAB — GLUCOSE, CAPILLARY
GLUCOSE-CAPILLARY: 236 mg/dL — AB (ref 70–99)
Glucose-Capillary: 160 mg/dL — ABNORMAL HIGH (ref 70–99)
Glucose-Capillary: 217 mg/dL — ABNORMAL HIGH (ref 70–99)
Glucose-Capillary: 279 mg/dL — ABNORMAL HIGH (ref 70–99)

## 2018-05-31 LAB — HEMOGLOBIN AND HEMATOCRIT, BLOOD
HCT: 28.3 % — ABNORMAL LOW (ref 36.0–46.0)
HEMOGLOBIN: 9.1 g/dL — AB (ref 12.0–15.0)

## 2018-05-31 MED ORDER — ASPIRIN 81 MG PO TBEC: 81.0000 mg | DELAYED_RELEASE_TABLET | Freq: Every day | ORAL | Status: DC

## 2018-05-31 MED ORDER — OXYCODONE HCL 5 MG PO TABS: 5.0000 mg | ORAL_TABLET | ORAL | 0 refills | Status: DC | PRN

## 2018-05-31 NOTE — Progress Notes (Signed)
Physical Therapy Treatment Patient Details Name: Diamond Rice MRN: [id number] DOB: May 15, 1971 Today's Date: 05/31/2018    History of Present Illness 47 year old female with a long-standing wound on her left foot involving her third and fourth toe with exposed tendon.  She underwent revascularization of her anterior tibial artery and is now indicated for amputation of third, fourth, and fifth toes on 05/28/18. PMH:  Sleep apnea, COPD, smoker, DM, Bipolar, HTN, anxiety.     PT Comments    Patient seen for mobility progression. Pt is making gradual progress toward PT goals and tolerated ambulating 28ft in room with RW and min A this session. Continue to progress as tolerated with anticipated d/c to SNF for further skilled PT services.     Follow Up Recommendations  SNF;Supervision/Assistance - 24 hour     Equipment Recommendations  Other (comment)(TBA)    Recommendations for Other Services       Precautions / Restrictions Precautions Precautions: Fall Required Braces or Orthoses: Other Brace/Splint Other Brace/Splint: post op shoe left foot Restrictions Weight Bearing Restrictions: No LLE Weight Bearing: Partial weight bearing(left heel)    Mobility  Bed Mobility Overal bed mobility: Needs Assistance Bed Mobility: Supine to Sit     Supine to sit: Min guard     General bed mobility comments: for safety; use of rail and HOB elevated  Transfers Overall transfer level: Needs assistance Equipment used: Rolling walker (2 wheeled) Transfers: Sit to/from Stand Sit to Stand: Min assist         General transfer comment: assist to steady and to power up into standing; cues for safe hand placement   Ambulation/Gait Ambulation/Gait assistance: Min assist Gait Distance (Feet): 20 Feet Assistive device: Rolling walker (2 wheeled) Gait Pattern/deviations: Step-to pattern;Decreased step length - left;Decreased stance time - left;Decreased weight shift to left;Trunk flexed;Wide  base of support;Antalgic;Decreased step length - right Gait velocity: slow   General Gait Details: cues for proximity to RW, sequencing, and weight bearing   Stairs             Wheelchair Mobility    Modified Rankin (Stroke Patients Only)       Balance Overall balance assessment: Needs assistance Sitting-balance support: No upper extremity supported;Feet supported Sitting balance-Leahy Scale: Fair     Standing balance support: During functional activity;Bilateral upper extremity supported Standing balance-Leahy Scale: Poor                              Cognition Arousal/Alertness: Awake/alert Behavior During Therapy: Flat affect Overall Cognitive Status: Within Functional Limits for tasks assessed                                        Exercises      General Comments        Pertinent Vitals/Pain Pain Assessment: Faces Faces Pain Scale: Hurts even more Pain Location: left foot and groin Pain Descriptors / Indicators: Guarding;Sore Pain Intervention(s): Limited activity within patient's tolerance;Monitored during session;Premedicated before session;Repositioned    Home Living                      Prior Function            PT Goals (current goals can now be found in the care plan section) Progress towards PT goals: Progressing toward goals    Frequency  Min 3X/week      PT Plan Current plan remains appropriate    Co-evaluation              AM-PAC PT "6 Clicks" Daily Activity  Outcome Measure  Difficulty turning over in bed (including adjusting bedclothes, sheets and blankets)?: Unable Difficulty moving from lying on back to sitting on the side of the bed? : Unable Difficulty sitting down on and standing up from a chair with arms (e.g., wheelchair, bedside commode, etc,.)?: Unable Help needed moving to and from a bed to chair (including a wheelchair)?: A Little Help needed walking in hospital  room?: A Little Help needed climbing 3-5 steps with a railing? : Total 6 Click Score: 10    End of Session Equipment Utilized During Treatment: Gait belt(post op shoe) Activity Tolerance: Patient limited by pain Patient left: with call bell/phone within reach;in bed Nurse Communication: Mobility status PT Visit Diagnosis: Unsteadiness on feet (R26.81);Muscle weakness (generalized) (M62.81);Pain Pain - Right/Left: Left Pain - part of body: Ankle and joints of foot     Time: 1212-1239 PT Time Calculation (min) (ACUTE ONLY): 27 min  Charges:  $Gait Training: 8-22 mins $Therapeutic Activity: 8-22 mins                     Earney Navy, PTA Acute Rehabilitation Services Pager: [phone] Office: [phone]     Darliss Cheney 05/31/2018, 1:14 PM

## 2018-05-31 NOTE — Discharge Summary (Addendum)
[facility] Discharge Summary   Patient ID:  Diamond Rice MRN: [id number] DOB/AGE: 1971-01-15 47 y.o.  Admit date: 05/27/2018 Discharge date: 06/10/2018 Date of Surgery: 05/28/2018 Surgeon: Surgeon(s): Waynetta Sandy, MD  Admission Diagnosis: Gangrene Toes- Lt Foot  Discharge Diagnoses:  Gangrene Toes- Lt Foot  Secondary Diagnoses: Past Medical History:  Diagnosis Date  . Anginal pain ([place])   . Anxiety   . Chronic lower back pain 04/17/2012   "just got over some; catched when I walked"  . COPD (chronic obstructive pulmonary disease) ([place])   . Critical lower limb ischemia 05/27/2018  . Depression   . Headache(784.0) 04/17/2012   "~ qod; lately waking up in am w/one"  . Heart attack ([place])   . High cholesterol   . Hypertension   . Migraines 04/17/2012  . Obesity   . Shortness of breath 04/17/2012   "all the time"  . Sleep apnea    scheduled to get her CPAP machine this week  . Stroke ([place])   . Type II diabetes mellitus ([place]) 08/28/2002    Procedure(s): AMPUTATION TOES THREE, FOUR AND FIVE on left foot  Discharged Condition: stable  HPI: Diamond Rice is a 47 y.o. female with history of a stroke affecting her left side requiring her to walk with a cane.  She has a wound on her left foot that is been present since June.  She underwent Balloon angioplasty of left anterior tibial artery with 2.5 mm balloon by Dr. Donzetta Matters 05/27/2018.  She was then scheduled for left toes 3-5 amputations.   Hospital Course:  Diamond Rice is a 47 y.o. female is S/P Left Procedure(s): AMPUTATION TOES THREE, FOUR AND FIVE on left foot  Due to pain control issues she has not participated in PT.   It has been recommended that she be admitted to a SNF for further care and rehab.   Post op ABI shows excellent improvement in perfusion and she now has a palpable DP pulse.  The incision is healing well.  HGB responded well to 2 units PRBC now 9.1 history of  GI/rectal bleeding Patient underwent colonoscopy , showed polyp status post polypectomy and rectal ulcers. Will start MiraLAX twice a day indefinitely and hydrocortisone suppository once daily for 10 days as per GI recommendation.  Acute renal injury likely secondary to GI bleed Cr 1.07, UO 475 cc last 24 hours. Schizoaffective disorder-continue Abilify  ADDENDUM 06/01/18: CSW arranged placement for SNF Patient feels much more comfortable ambulating with new post op shoe She will be discharged this morning in stable condition She maintained a palpable L DP pulse She will be asked to take aspirin and plavix daily Follow up to recheck incision with Dr. Donzetta Matters in 2 weeks  06/06/2018 patient returned to the OR with Dr. Donzetta Matters for debridement and irrigation of the left amputation site.  She had continued bleeding at skin edges.  Bleeding controlled with hemostatic agents applied to skin edges at bedside.  Wet to dry dressing placed for transfer to SNF.  Place wound vac over wound bed change 3 times a week.  Continuous suction 125.  Final wound size was 5.5 x 3 x 2 cm.  The next morning, she was having oozing and the wound vac would not stay sealed.  It was changed to a wet to dry dressing for a few days.  The oozing resolved and a wound vac was placed back on the wound and she is discharged to SNF.  Significant Diagnostic Studies: CBC Lab Results  Component Value Date   WBC 7.9 06/09/2018   HGB 8.3 (L) 06/09/2018   HCT 27.0 (L) 06/09/2018   MCV 72.0 (L) 06/09/2018   PLT 383 06/09/2018    BMET    Component Value Date/Time   NA 137 06/09/2018 0239   K 4.2 06/09/2018 0239   CL 104 06/09/2018 0239   CO2 26 06/09/2018 0239   GLUCOSE 150 (H) 06/09/2018 0239   BUN 12 06/09/2018 0239   CREATININE 1.25 (H) 06/09/2018 0239   CREATININE 1.81 (H) 05/23/2018 1620   CALCIUM 9.1 06/09/2018 0239   GFRNONAA 51 (L) 06/09/2018 0239   GFRNONAA 54 (L) 09/28/2014 1034   GFRAA 59 (L) 06/09/2018  0239   GFRAA 62 09/28/2014 1034   COAG Lab Results  Component Value Date   INR 0.96 04/02/2018   INR 0.98 01/13/2018   INR 0.98 11/18/2012     Disposition:  Discharge to :Skilled nursing facility Discharge Instructions    Call MD for:  redness, tenderness, or signs of infection (pain, swelling, bleeding, redness, odor or green/yellow discharge around incision site)   Complete by:  As directed    Call MD for:  severe or increased pain, loss or decreased feeling  in affected limb(s)   Complete by:  As directed    Call MD for:  temperature >100.5   Complete by:  As directed    Resume previous diet   Complete by:  As directed      Allergies as of 06/10/2018      Reactions   Morphine And Related Hives   Metformin Diarrhea      Medication List    STOP taking these medications   amoxicillin-clavulanate 875-125 MG tablet Commonly known as:  AUGMENTIN   ciprofloxacin 500 MG tablet Commonly known as:  CIPRO   collagenase ointment Commonly known as:  SANTYL   oxyCODONE-acetaminophen 5-325 MG tablet Commonly known as:  PERCOCET/ROXICET     TAKE these medications   ARIPiprazole 15 MG tablet Commonly known as:  ABILIFY Take 1 tablet (15 mg total) by mouth at bedtime.   aspirin 81 MG EC tablet Take 1 tablet (81 mg total) by mouth daily.   cloNIDine 0.3 MG tablet Commonly known as:  CATAPRES Take 1 tablet (0.3 mg total) by mouth 3 (three) times daily.   clopidogrel 75 MG tablet Commonly known as:  PLAVIX Take 1 tablet (75 mg total) by mouth daily.   furosemide 40 MG tablet Commonly known as:  LASIX Take 1 tablet (40 mg total) by mouth daily.   gabapentin 100 MG capsule Commonly known as:  NEURONTIN Take 1 capsule (100 mg total) by mouth 3 (three) times daily.   hydrALAZINE 50 MG tablet Commonly known as:  APRESOLINE Take 1 tablet (50 mg total) by mouth every 8 (eight) hours.   Insulin Detemir 100 UNIT/ML Pen Commonly known as:  LEVEMIR Inject 15 Units into  the skin 2 (two) times daily.   lisinopril 20 MG tablet Commonly known as:  PRINIVIL,ZESTRIL Take 1 tablet (20 mg total) by mouth daily. For high blood pressure   oxyCODONE 5 MG immediate release tablet Commonly known as:  Oxy IR/ROXICODONE Take 1-2 tablets (5-10 mg total) by mouth every 4 (four) hours as needed for moderate pain.   pentoxifylline 400 MG CR tablet Commonly known as:  TRENTAL Take 400 mg by mouth 3 (three) times daily with meals.   potassium chloride 10 MEQ tablet Commonly known  as:  K-DUR Take 2 tablets (20 mEq total) by mouth daily.      Verbal and written Discharge instructions given to the patient. Wound care per Discharge AVS  Contact information for follow-up providers    Waynetta Sandy, MD Follow up in 3 week(s).   Specialties:  Vascular Surgery, Cardiology Why:  office will call Contact information: [place] [address] [phone]            Contact information for after-discharge care    [facility] .   Service:  Skilled Nursing Contact information: [address] [place] [phone]                  Signed: Addendum to original DCS: Leontine Locket 06/10/2018, 7:53 AM   (original DCS by Arlee Muslim)

## 2018-05-31 NOTE — Progress Notes (Signed)
CSW continuing to await patient's PASRR. Clinicals were submitted to PASRR yesterday for manual review. PASRR remains under review. CSW sent additional clinicals today. Patient requires PASRR before admitting to facility. CSW to follow.  Diamond Rice, [place]

## 2018-05-31 NOTE — Clinical Social Work Note (Signed)
CSW uploaded additional clinicals to [facility] for PASARR review. They are now requesting 30 day note. Will put on front of chart for MD to sign. Provided patient with bed offers: [facility], [facility], and [facility]. Patient does not want [facility] and will make decision between the other two by tomorrow.  Diamond Rice, [place]

## 2018-05-31 NOTE — Progress Notes (Addendum)
[facility] [place]  Subjective  - Pain in left foot at incision.  More alert.   Objective (!) 153/95 86 98.3 F (36.8 C) (Oral) 19 96%  Intake/Output Summary (Last 24 hours) at 05/31/2018 0726 Last data filed at 05/31/2018 2482 Gross per 24 hour  Intake 2997.7 ml  Output 475 ml  Net 2522.7 ml    Palpable DP, brisk signals PT/peroneal left foot Incision on lateral left foot healing well Heart RRR Lungs non labored breathing   RIGHT    LEFT    PRESSURE WAVEFORM  PRESSURE WAVEFORM  BRACHIAL 136 triphasic BRACHIAL 149 triphasic  DP 99 triphasic DP 88 triphasic         PT 155 triphasic PT 121 triphasic    RIGHT LEFT  ABI 1.04 0.81     Assessment/Planning:  PAD POD #  3 Amputation left sided toes 3 through 5 05/27/2018 angiogram with CO2 history of renal disease Cr 1.21 Balloon angioplasty of left anterior tibial artery with 2.5 mm balloon  Improved ABI's with palpable left DP and incisional healing  HGB responded well to 2 units PRBC now 9.1 history of GI/rectal bleeding   Patient underwent colonoscopy , showed polyp status post polypectomy and rectal ulcers.  Will start MiraLAX twice a day indefinitely and hydrocortisone suppository once daily for 10 days as per GI recommendation.  Acute renal injury likely secondary to GI bleed Cr 1.07, UO 475 cc last 24 hours.  Continue fluids NS IV 100, 12.5 HCTZ and replace K as needed. CBC and Bmet will be ordered for tomorrow. Will try darco shoe for heel weight bearing Encourage mobility   Schizoaffective disorder-continue Abilify  Roxy Horseman 05/31/2018 7:26 AM --  Laboratory Lab Results: Recent Labs    05/30/18 0801 05/31/18 0240  WBC 9.2  --   HGB 6.5* 9.1*  HCT 20.6* 28.3*  PLT 216  --    BMET Recent Labs    05/30/18 0801  NA 137  K 3.3*  CL 108  CO2 21*  GLUCOSE 98  BUN 12  CREATININE 1.07*  CALCIUM 7.6*    COAG Lab Results  Component  Value Date   INR 0.96 04/02/2018   INR 0.98 01/13/2018   INR 0.98 11/18/2012   No results found for: PTT   I have interviewed and examined patient with PA and agree with assessment and plan above.  Foot is now well perfused.  Continue postop shoe when out of bed and will need SNF on discharge.  Emanuele Mcwhirter C. Donzetta Matters, MD [facility] [place] Office: [phone] Pager: [phone]

## 2018-05-31 NOTE — Progress Notes (Signed)
Orthopedic Tech Progress Note Patient Details:  Diamond Rice 1971-05-01 [id number]  Ortho Devices Type of Ortho Device: Darco shoe Ortho Device/Splint Location: lle Ortho Device/Splint Interventions: Application   Post Interventions Patient Tolerated: Well Instructions Provided: Care of device Viewed order from doctor's order list  Hildred Priest 05/31/2018, 3:29 PM

## 2018-05-31 NOTE — Progress Notes (Signed)
Bladder scan 152 cc urine. Per pt and nurse tech, pt voided 300 cc this am. Will continue to monitor output.   Ara Kussmaul BSN, RN

## 2018-05-31 NOTE — Telephone Encounter (Signed)
sch appt spk to pt mld ltr 06/28/18 4pm p/o MD

## 2018-06-01 LAB — BASIC METABOLIC PANEL
ANION GAP: 8 (ref 5–15)
BUN: 11 mg/dL (ref 6–20)
CHLORIDE: 106 mmol/L (ref 98–111)
CO2: 22 mmol/L (ref 22–32)
Calcium: 8.6 mg/dL — ABNORMAL LOW (ref 8.9–10.3)
Creatinine, Ser: 1.08 mg/dL — ABNORMAL HIGH (ref 0.44–1.00)
Glucose, Bld: 195 mg/dL — ABNORMAL HIGH (ref 70–99)
POTASSIUM: 3.9 mmol/L (ref 3.5–5.1)
SODIUM: 136 mmol/L (ref 135–145)

## 2018-06-01 LAB — CBC
HCT: 28.1 % — ABNORMAL LOW (ref 36.0–46.0)
HEMOGLOBIN: 9 g/dL — AB (ref 12.0–15.0)
MCH: 23.2 pg — AB (ref 26.0–34.0)
MCHC: 32 g/dL (ref 30.0–36.0)
MCV: 72.4 fL — ABNORMAL LOW (ref 78.0–100.0)
PLATELETS: 291 10*3/uL (ref 150–400)
RBC: 3.88 MIL/uL (ref 3.87–5.11)
RDW: 16.7 % — ABNORMAL HIGH (ref 11.5–15.5)
WBC: 8.3 10*3/uL (ref 4.0–10.5)

## 2018-06-01 LAB — GLUCOSE, CAPILLARY
GLUCOSE-CAPILLARY: 200 mg/dL — AB (ref 70–99)
GLUCOSE-CAPILLARY: 211 mg/dL — AB (ref 70–99)
Glucose-Capillary: 182 mg/dL — ABNORMAL HIGH (ref 70–99)
Glucose-Capillary: 224 mg/dL — ABNORMAL HIGH (ref 70–99)

## 2018-06-01 MED ORDER — CLOPIDOGREL BISULFATE 75 MG PO TABS: 75.0000 mg | ORAL_TABLET | Freq: Every day | ORAL | 3 refills | Status: DC

## 2018-06-01 NOTE — Progress Notes (Addendum)
CSW contacted pt to get choice of facility for discharge. Pt now does not want listed facilities and want to be discharged in [place] area. CSW did relay doctors have placed d/c summary and cleared for discharge. CSW also mentioned process of finding placement during weekend will be difficult and would have to be medically indicated to stay in hospital. Pt would like to speak with MD in regards to discharge. Does not feel she is reayd for discharge. CSW contacted RN and relayed concerns. RN stated she will reach back to MD and relay pt concerns. Will call back CSW who will proceed accordingly based on conversation.   *Pt requires PASSR review. Will not be able to discharge until attained. CSW will f/u with review.

## 2018-06-01 NOTE — Progress Notes (Addendum)
  Progress Note    06/01/2018 7:57 AM 4 Days Post-Op  Subjective:  Patient likes new post op shoe must better than her previous shoe.  She believes she might be discharged to SNF today.   Vitals:   06/01/18 0300 06/01/18 0627  BP: (!) 148/90 (!) 169/90  Pulse: 76   Resp: (!) 22   Temp: 98.1 F (36.7 C)   SpO2: 98%    Physical Exam: Lungs:  Non labored Incisions:  Incision of L foot c/d/i Extremities: palpable L DP Abdomen:  soft Neurologic: A&O  CBC    Component Value Date/Time   WBC 8.3 06/01/2018 0301   RBC 3.88 06/01/2018 0301   HGB 9.0 (L) 06/01/2018 0301   HCT 28.1 (L) 06/01/2018 0301   PLT 291 06/01/2018 0301   MCV 72.4 (L) 06/01/2018 0301   MCH 23.2 (L) 06/01/2018 0301   MCHC 32.0 06/01/2018 0301   RDW 16.7 (H) 06/01/2018 0301   LYMPHSABS 2.8 05/30/2018 0801   MONOABS 1.2 (H) 05/30/2018 0801   EOSABS 0.4 05/30/2018 0801   BASOSABS 0.0 05/30/2018 0801    BMET    Component Value Date/Time   NA 136 06/01/2018 0301   K 3.9 06/01/2018 0301   CL 106 06/01/2018 0301   CO2 22 06/01/2018 0301   GLUCOSE 195 (H) 06/01/2018 0301   BUN 11 06/01/2018 0301   CREATININE 1.08 (H) 06/01/2018 0301   CREATININE 1.81 (H) 05/23/2018 1620   CALCIUM 8.6 (L) 06/01/2018 0301   GFRNONAA >60 06/01/2018 0301   GFRNONAA 54 (L) 09/28/2014 1034   GFRAA >60 06/01/2018 0301   GFRAA 62 09/28/2014 1034    INR    Component Value Date/Time   INR 0.96 04/02/2018 0158     Intake/Output Summary (Last 24 hours) at 06/01/2018 0757 Last data filed at 06/01/2018 0411 Gross per 24 hour  Intake 1598.88 ml  Output 1100 ml  Net 498.88 ml     Assessment/Plan:  47 y.o. female is s/p Amputation left sided toes 3 through 5 05/27/2018 angiogram with CO2 history of renal disease Cr 1.21Balloon angioplasty of left anterior tibial artery with 2.5 mm balloon 4 Days Post-Op   Palpable L DP Encouraged ambulation with new post op shoe CSW arranging SNF placement Prescription provided for  aspirin and plavix Follow up for wound check in office in 2 weeks Hopeful d/c to SNF today   Dagoberto Ligas, PA-C [facility] [phone] 06/01/2018 7:57 AM  I have interviewed the patient and examined the patient. I agree with the findings by the PA. Palpable left dorsalis pedis pulse. Her toe amputation sites are healing nicely. She is doing physical therapy learning to walk with the Darco shoe.  Gae Gallop, MD [phone]

## 2018-06-01 NOTE — Care Management Note (Signed)
Case Management Note  Patient Details  Name: Diamond Rice No MRN: [id number] Date of Birth: 04-25-1971  Subjective/Objective:                    Action/Plan:  Will DC to SNF as facilitated by CSW.   Expected Discharge Date:  06/01/18               Expected Discharge Plan:  Skilled Nursing Facility  In-House Referral:  Clinical Social Work  Discharge planning Services  CM Consult  Post Acute Care Choice:    Choice offered to:     DME Arranged:    DME Agency:     HH Arranged:    [place] Agency:     Status of Service:  Completed, signed off  If discussed at [employer] [facility] [employer], dates discussed:    Additional Comments:  Carles Collet, RN 06/01/2018, 8:39 AM

## 2018-06-02 LAB — GLUCOSE, CAPILLARY
GLUCOSE-CAPILLARY: 167 mg/dL — AB (ref 70–99)
Glucose-Capillary: 166 mg/dL — ABNORMAL HIGH (ref 70–99)
Glucose-Capillary: 120 mg/dL — ABNORMAL HIGH (ref 70–99)
Glucose-Capillary: 167 mg/dL — ABNORMAL HIGH (ref 70–99)

## 2018-06-02 MED ORDER — MAGNESIUM HYDROXIDE 400 MG/5ML PO SUSP
30.0000 mL | Freq: Every day | ORAL | Status: DC | PRN
  Administered 2018-06-02 – 2018-06-03 (×2): 30 mL via ORAL
  Filled 2018-06-02 (×2): qty 30

## 2018-06-02 NOTE — Progress Notes (Signed)
CSW spoke with pt who agrees to placement in [employer] Relayed updated bed offers. Faxed 30 day note on PASSR for approval. Anticipate discharge once PASSR approval attained.

## 2018-06-02 NOTE — Progress Notes (Addendum)
  Progress Note  06/02/2018 8:16 AM 5 Days Post-Op  Subjective:  No new complaints   Vitals:   06/02/18 0629 06/02/18 0653  BP: (!) 176/106 (!) 159/103  Pulse: 70   Resp: (!) 21 19  Temp: 98.4 F (36.9 C)   SpO2: 98%    Physical Exam: Lungs:  Non labored Incisions:  L foot with minimal sanguinous oozing Extremities:   Abdomen:  Palpable L DP; mild edema L foot Neurologic: A&O  CBC    Component Value Date/Time   WBC 8.3 06/01/2018 0301   RBC 3.88 06/01/2018 0301   HGB 9.0 (L) 06/01/2018 0301   HCT 28.1 (L) 06/01/2018 0301   PLT 291 06/01/2018 0301   MCV 72.4 (L) 06/01/2018 0301   MCH 23.2 (L) 06/01/2018 0301   MCHC 32.0 06/01/2018 0301   RDW 16.7 (H) 06/01/2018 0301   LYMPHSABS 2.8 05/30/2018 0801   MONOABS 1.2 (H) 05/30/2018 0801   EOSABS 0.4 05/30/2018 0801   BASOSABS 0.0 05/30/2018 0801    BMET    Component Value Date/Time   NA 136 06/01/2018 0301   K 3.9 06/01/2018 0301   CL 106 06/01/2018 0301   CO2 22 06/01/2018 0301   GLUCOSE 195 (H) 06/01/2018 0301   BUN 11 06/01/2018 0301   CREATININE 1.08 (H) 06/01/2018 0301   CREATININE 1.81 (H) 05/23/2018 1620   CALCIUM 8.6 (L) 06/01/2018 0301   GFRNONAA >60 06/01/2018 0301   GFRNONAA 54 (L) 09/28/2014 1034   GFRAA >60 06/01/2018 0301   GFRAA 62 09/28/2014 1034    INR    Component Value Date/Time   INR 0.96 04/02/2018 0158     Intake/Output Summary (Last 24 hours) at 06/02/2018 0816 Last data filed at 06/02/2018 0600 Gross per 24 hour  Intake 600 ml  Output 1550 ml  Net -950 ml     Assessment/Plan:  47 y.o. female is s/p Amputation left sided toes 3 through 5 05/27/2018 angiogram with CO2 history of renal disease Cr 1.21Balloon angioplasty of left anterior tibial artery with 2.5 mm balloon 5 Days Post-Op   Perfusing L foot well with palpable DP Continue plavix Encouraged ambulation with post op shoe CSW working on SNF placement; pt ready for d/c when placement arranged   Dagoberto Ligas,  PA-C [facility] [phone] 06/02/2018 8:16 AM  I have interviewed the patient and examined the patient. I agree with the findings by the PA. Palpable left dorsalis pedis pulse.   Toe amputation sites are healing nicely. Awaiting placement.  Gae Gallop, MD [phone]

## 2018-06-02 NOTE — Progress Notes (Signed)
  Placed patient on CPAP for HS per order via [facility], auto settings (max 18, min 4).

## 2018-06-03 LAB — GLUCOSE, CAPILLARY
GLUCOSE-CAPILLARY: 127 mg/dL — AB (ref 70–99)
GLUCOSE-CAPILLARY: 136 mg/dL — AB (ref 70–99)
GLUCOSE-CAPILLARY: 174 mg/dL — AB (ref 70–99)
Glucose-Capillary: 145 mg/dL — ABNORMAL HIGH (ref 70–99)

## 2018-06-03 MED ORDER — BISACODYL 10 MG RE SUPP
10.0000 mg | Freq: Once | RECTAL | Status: AC
  Administered 2018-06-03: 10 mg via RECTAL
  Filled 2018-06-03: qty 1

## 2018-06-03 NOTE — Progress Notes (Signed)
CSW received call from [facility] for PASRR review, she will be out to review patient for PASRR number today. CSW met with patient to discuss facility choices, and patient has selected [facility]. CSW confirmed bed availability with [facility].  CSW to follow.  Laveda Abbe, [place] Clinical Social Worker [phone]

## 2018-06-03 NOTE — Progress Notes (Addendum)
[facility] [place]  Subjective  - Doing better aver all.   Objective (!) 176/96 77 98.4 F (36.9 C) (Oral) 19 98%  Intake/Output Summary (Last 24 hours) at 06/03/2018 0719 Last data filed at 06/02/2018 2000 Gross per 24 hour  Intake 840 ml  Output 790 ml  Net 50 ml    Palpable left DP Incision on left lateral foot healing well without skin breakdown, pin point bleeding.  Dressing applied Heart RRR Lungs non labored breathing  Assessment/Planning: 47 y.o. female is s/p Amputation left sided toes 3 through 5 05/27/2018 angiogram with CO2 history of renal disease Cr 1.21Balloon angioplasty of left anterior tibial artery with 2.5 mm balloon 5 Days Post-Op   Left foot healing well with palpable perfusion. Continue Plavix and aspirin Dry dressing PRN to left foot incision.  Sutures will stay intact for 4 weeks total post op.  Heel weight bearing in black darco shoe. Pending SNF waitining for insurance approval.  Roxy Horseman 06/03/2018 7:19 AM --  Laboratory Lab Results: Recent Labs    06/01/18 0301  WBC 8.3  HGB 9.0*  HCT 28.1*  PLT 291   BMET Recent Labs    06/01/18 0301  NA 136  K 3.9  CL 106  CO2 22  GLUCOSE 195*  BUN 11  CREATININE 1.08*  CALCIUM 8.6*    COAG Lab Results  Component Value Date   INR 0.96 04/02/2018   INR 0.98 01/13/2018   INR 0.98 11/18/2012   No results found for: PTT   I have independently interviewed and examined patient and agree with PA assessment and plan above.   Dawnita Molner C. Donzetta Matters, MD [facility] [place] Office: [phone] Pager: [phone]

## 2018-06-03 NOTE — Progress Notes (Signed)
Physical Therapy Treatment Patient Details Name: Diamond Rice MRN: [id number] DOB: [birth year] Today's Date: 06/03/2018    History of Present Illness 47 year old female with a long-standing wound on her left foot involving her third and fourth toe with exposed tendon.  She underwent revascularization of her anterior tibial artery and is now indicated for amputation of third, fourth, and fifth toes on 05/28/18. PMH:  Sleep apnea, COPD, smoker, DM, Bipolar, HTN, anxiety.     PT Comments    Pt making slow but steady progress with mobility. Continue to recommend ST-SNF prior to return home.    Follow Up Recommendations  SNF;Supervision/Assistance - 24 hour     Equipment Recommendations  Other (comment)(TBA)    Recommendations for Other Services       Precautions / Restrictions Precautions Precautions: Fall Required Braces or Orthoses: Other Brace/Splint Other Brace/Splint: darco shoe left foot Restrictions Weight Bearing Restrictions: No LLE Weight Bearing: Partial weight bearing(left heel)    Mobility  Bed Mobility Overal bed mobility: Needs Assistance Bed Mobility: Supine to Sit     Supine to sit: Min assist;HOB elevated     General bed mobility comments: assist to bring left leg off of bed. Incr time and use of rail  Transfers Overall transfer level: Needs assistance Equipment used: Rolling walker (2 wheeled) Transfers: Sit to/from Stand Sit to Stand: Min assist         General transfer comment: Assist to bring hips up and for balance  Ambulation/Gait Ambulation/Gait assistance: Min assist Gait Distance (Feet): 35 Feet Assistive device: Rolling walker (2 wheeled) Gait Pattern/deviations: Step-to pattern;Decreased step length - left;Decreased stance time - left;Decreased weight shift to left;Trunk flexed;Wide base of support;Antalgic;Decreased step length - right Gait velocity: slow Gait velocity interpretation: <1.31 ft/sec, indicative of household  ambulator General Gait Details: Assist for balance and support. Very slow pace. HR to 120's. SpO2 96% on RA   Stairs             Wheelchair Mobility    Modified Rankin (Stroke Patients Only)       Balance Overall balance assessment: Needs assistance Sitting-balance support: No upper extremity supported;Feet supported Sitting balance-Leahy Scale: Fair     Standing balance support: During functional activity;Bilateral upper extremity supported Standing balance-Leahy Scale: Poor Standing balance comment: walker and min guard for static standing                            Cognition Arousal/Alertness: Awake/alert Behavior During Therapy: Flat affect Overall Cognitive Status: Within Functional Limits for tasks assessed                                        Exercises      General Comments        Pertinent Vitals/Pain Pain Assessment: 0-10 Faces Pain Scale: Hurts worst Pain Location: lt foot Pain Descriptors / Indicators: Guarding;Aching Pain Intervention(s): Limited activity within patient's tolerance;Monitored during session;Repositioned;Patient requesting pain meds-RN notified    Home Living                      Prior Function            PT Goals (current goals can now be found in the care plan section) Progress towards PT goals: Progressing toward goals    Frequency    Min 3X/week  PT Plan Current plan remains appropriate    Co-evaluation              AM-PAC PT "6 Clicks" Daily Activity  Outcome Measure  Difficulty turning over in bed (including adjusting bedclothes, sheets and blankets)?: A Little Difficulty moving from lying on back to sitting on the side of the bed? : Unable Difficulty sitting down on and standing up from a chair with arms (e.g., wheelchair, bedside commode, etc,.)?: Unable Help needed moving to and from a bed to chair (including a wheelchair)?: A Little Help needed walking in  hospital room?: A Little Help needed climbing 3-5 steps with a railing? : Total 6 Click Score: 12    End of Session Equipment Utilized During Treatment: Gait belt Activity Tolerance: Patient limited by fatigue Patient left: in chair;with call bell/phone within reach;with chair alarm set Nurse Communication: Mobility status PT Visit Diagnosis: Unsteadiness on feet (R26.81);Muscle weakness (generalized) (M62.81);Pain Pain - Right/Left: Left Pain - part of body: Ankle and joints of foot     Time: 9024-0973 PT Time Calculation (min) (ACUTE ONLY): 32 min  Charges:  $Gait Training: 23-37 mins                     [place] Pager [phone] Office [place] 06/03/2018, 2:59 PM

## 2018-06-04 LAB — GLUCOSE, CAPILLARY
GLUCOSE-CAPILLARY: 144 mg/dL — AB (ref 70–99)
GLUCOSE-CAPILLARY: 146 mg/dL — AB (ref 70–99)
GLUCOSE-CAPILLARY: 95 mg/dL (ref 70–99)
GLUCOSE-CAPILLARY: 115 mg/dL — AB (ref 70–99)

## 2018-06-04 NOTE — Progress Notes (Signed)
Clinical Social Worker following patient for support. CSW acknowledge patient being medically ready to transitioned to a rehab facility. At this time patient does not have a PASRR number from the state. CSW spoke to a state employee and they stated that at this time they are still processing patients PASRR number. Employee stated that they hope to have number by the end of business day today. Patient iwll be unable to discharge without a PASRR number  [name], MSW,  [place]

## 2018-06-04 NOTE — Progress Notes (Addendum)
  Progress Note    06/04/2018 7:35 AM 7 Days Post-Op  Subjective:  No pain L foot;  Bleeding and yellowish drainage when walking yesterday per nursing   Vitals:   06/03/18 2335 06/04/18 0639  BP:  (!) 164/90  Pulse: 77   Resp: 16   Temp:    SpO2: 100%    Physical Exam: Lungs:  Non labored Incisions:  L foot incision macerated with some thick drainage and soft skin edges Extremities:  Palpable L ATA Abdomen:  Soft Neurologic: A&O  CBC    Component Value Date/Time   WBC 8.3 06/01/2018 0301   RBC 3.88 06/01/2018 0301   HGB 9.0 (L) 06/01/2018 0301   HCT 28.1 (L) 06/01/2018 0301   PLT 291 06/01/2018 0301   MCV 72.4 (L) 06/01/2018 0301   MCH 23.2 (L) 06/01/2018 0301   MCHC 32.0 06/01/2018 0301   RDW 16.7 (H) 06/01/2018 0301   LYMPHSABS 2.8 05/30/2018 0801   MONOABS 1.2 (H) 05/30/2018 0801   EOSABS 0.4 05/30/2018 0801   BASOSABS 0.0 05/30/2018 0801    BMET    Component Value Date/Time   NA 136 06/01/2018 0301   K 3.9 06/01/2018 0301   CL 106 06/01/2018 0301   CO2 22 06/01/2018 0301   GLUCOSE 195 (H) 06/01/2018 0301   BUN 11 06/01/2018 0301   CREATININE 1.08 (H) 06/01/2018 0301   CREATININE 1.81 (H) 05/23/2018 1620   CALCIUM 8.6 (L) 06/01/2018 0301   GFRNONAA >60 06/01/2018 0301   GFRNONAA 54 (L) 09/28/2014 1034   GFRAA >60 06/01/2018 0301   GFRAA 62 09/28/2014 1034    INR    Component Value Date/Time   INR 0.96 04/02/2018 0158     Intake/Output Summary (Last 24 hours) at 06/04/2018 0735 Last data filed at 06/04/2018 0600 Gross per 24 hour  Intake 910 ml  Output 1470 ml  Net -560 ml     Assessment/Plan:  47 y.o. female is s/p Amputation left sided toes 3 through 5 05/27/2018 angiogram with CO2 history of renal disease Cr 1.21Balloon angioplasty of left anterior tibial artery with 2.5 mm balloon 7 Days Post-Op    Palpable L ATA Incision now macerated with some drainage; will leave open to air this morning Encouraged ambulation only with heel  shoe Dr. Donzetta Matters will evaluate the wound later today   Dagoberto Ligas, PA-C [facility] [phone] 06/04/2018 7:35 AM  I have independently interviewed and examined patient and agree with PA assessment and plan above.  We will leave wound open to air with hopeful healing given palpable dorsalis pedis pulse.  Okay to transition to SNF when available.  Zakai Gonyea C. Donzetta Matters, MD [facility] [place] Office: [phone] Pager: [phone]

## 2018-06-04 NOTE — Progress Notes (Signed)
  Placed patient on CPAP via FFM, auto settings (max 18, min 4)

## 2018-06-05 LAB — GLUCOSE, CAPILLARY
GLUCOSE-CAPILLARY: 162 mg/dL — AB (ref 70–99)
Glucose-Capillary: 98 mg/dL (ref 70–99)
Glucose-Capillary: 110 mg/dL — ABNORMAL HIGH (ref 70–99)
Glucose-Capillary: 141 mg/dL — ABNORMAL HIGH (ref 70–99)

## 2018-06-05 MED ORDER — CEFAZOLIN SODIUM-DEXTROSE 2-4 GM/100ML-% IV SOLN
2.0000 g | INTRAVENOUS | Status: AC
  Administered 2018-06-06: 2 g via INTRAVENOUS
  Filled 2018-06-05: qty 100

## 2018-06-05 MED ORDER — HYDRALAZINE HCL 20 MG/ML IJ SOLN
20.0000 mg | Freq: Once | INTRAMUSCULAR | Status: AC
  Administered 2018-06-05: 20 mg via INTRAVENOUS
  Filled 2018-06-05: qty 1

## 2018-06-05 NOTE — Progress Notes (Signed)
Pt placed on CPAP with full face mask for the night.  Pt tolerating well.

## 2018-06-05 NOTE — Progress Notes (Addendum)
  Progress Note    06/05/2018 8:09 AM 8 Days Post-Op  Subjective:  No new complaints today   Vitals:   06/05/18 0603 06/05/18 0630  BP: (!) 195/101 (!) 181/104  Pulse: 65 64  Resp: 14 16  Temp: 98 F (36.7 C)   SpO2: 98% 100%   Physical Exam: Lungs:  Non labored Incisions:  L foot incision macerated with some thick drainage collected on dressing Extremities:  Palpable L DP Abdomen:  Soft Neurologic: A&O  CBC    Component Value Date/Time   WBC 8.3 06/01/2018 0301   RBC 3.88 06/01/2018 0301   HGB 9.0 (L) 06/01/2018 0301   HCT 28.1 (L) 06/01/2018 0301   PLT 291 06/01/2018 0301   MCV 72.4 (L) 06/01/2018 0301   MCH 23.2 (L) 06/01/2018 0301   MCHC 32.0 06/01/2018 0301   RDW 16.7 (H) 06/01/2018 0301   LYMPHSABS 2.8 05/30/2018 0801   MONOABS 1.2 (H) 05/30/2018 0801   EOSABS 0.4 05/30/2018 0801   BASOSABS 0.0 05/30/2018 0801    BMET    Component Value Date/Time   NA 136 06/01/2018 0301   K 3.9 06/01/2018 0301   CL 106 06/01/2018 0301   CO2 22 06/01/2018 0301   GLUCOSE 195 (H) 06/01/2018 0301   BUN 11 06/01/2018 0301   CREATININE 1.08 (H) 06/01/2018 0301   CREATININE 1.81 (H) 05/23/2018 1620   CALCIUM 8.6 (L) 06/01/2018 0301   GFRNONAA >60 06/01/2018 0301   GFRNONAA 54 (L) 09/28/2014 1034   GFRAA >60 06/01/2018 0301   GFRAA 62 09/28/2014 1034    INR    Component Value Date/Time   INR 0.96 04/02/2018 0158     Intake/Output Summary (Last 24 hours) at 06/05/2018 0809 Last data filed at 06/05/2018 0603 Gross per 24 hour  Intake 980 ml  Output 1250 ml  Net -270 ml     Assessment/Plan:  47 y.o. female is s/p Amputation left sided toes 3 through 5 8 Days Post-Op   - Palpable L DP - L foot incision still macerated having had dressing re-applied overnight; nursing communication order placed to keep L foot open to air for now - CSW arranging SNF placement   Dagoberto Ligas, PA-C [facility] [phone] 06/05/2018 8:09 AM  I have  independently interviewed and examined patient and agree with PA assessment and plan above.  SNF placement is being arranged.  We will take her to the operating room tomorrow to open the wound and debrided likely place wound VAC.   C. Donzetta Matters, MD [facility] [place] Office: [phone] Pager: [phone]

## 2018-06-05 NOTE — Progress Notes (Signed)
Patient not ready for  CPAP at this time. Will call when ready. RN aware.

## 2018-06-05 NOTE — Progress Notes (Signed)
Physical Therapy Treatment Patient Details Name: Diamond Rice MRN: [id number] DOB: 1971/02/11 Today's Date: 06/05/2018    History of Present Illness 47 year old female with a long-standing wound on her left foot involving her third and fourth toe with exposed tendon.  She underwent revascularization of her anterior tibial artery and is now indicated for amputation of third, fourth, and fifth toes on 05/28/18. PMH:  Sleep apnea, COPD, smoker, DM, Bipolar, HTN, anxiety.     PT Comments    Patient received in bed, pleasant and willing to participate in PT today; retook BP with highest measure being 179/100, RN present and aware of patient status. Darco shoe applied totalA for time management. Able to perform supine to sit with S today, however requires Min guard for stand pivot transfer to commode and gait approximately 36f in her room with RW and PWB gait, limited by fatigue, pain, malaise today. She required MinA to return to bed and declines up to chair, was left in bed with all needs met and questions/concerns addressed this morning.    Follow Up Recommendations  SNF;Supervision/Assistance - 24 hour     Equipment Recommendations  Other (comment)(TBA )    Recommendations for Other Services       Precautions / Restrictions Precautions Precautions: Fall Required Braces or Orthoses: Other Brace/Splint Other Brace/Splint: darco shoe left foot Restrictions Weight Bearing Restrictions: Yes LLE Weight Bearing: Partial weight bearing    Mobility  Bed Mobility Overal bed mobility: Needs Assistance Bed Mobility: Supine to Sit;Sit to Supine     Supine to sit: Supervision Sit to supine: Min assist   General bed mobility comments: able to perform supine to sit with close S, but required MinA to bring both legs back up into bed   Transfers Overall transfer level: Needs assistance Equipment used: Rolling walker (2 wheeled) Transfers: Sit to/from Stand Sit to Stand: Min  guard Stand pivot transfers: Min guard       General transfer comment: min guard for all transfers today for safety and steadying, no boost needed   Ambulation/Gait Ambulation/Gait assistance: Min guard Gait Distance (Feet): 20 Feet Assistive device: Rolling walker (2 wheeled) Gait Pattern/deviations: Step-to pattern;Decreased step length - left;Decreased stance time - left;Decreased weight shift to left;Trunk flexed;Wide base of support;Antalgic;Decreased step length - right Gait velocity: slow   General Gait Details: min guard for steadying and safety, very slow pace and very limited by fatigue and malaise this morning, HR 87   Stairs             Wheelchair Mobility    Modified Rankin (Stroke Patients Only)       Balance Overall balance assessment: Needs assistance Sitting-balance support: No upper extremity supported;Feet supported Sitting balance-Leahy Scale: Good     Standing balance support: During functional activity;Bilateral upper extremity supported Standing balance-Leahy Scale: Poor Standing balance comment: walker and min guard for static standing                            Cognition Arousal/Alertness: Awake/alert Behavior During Therapy: Flat affect Overall Cognitive Status: Within Functional Limits for tasks assessed                                        Exercises      General Comments        Pertinent Vitals/Pain Pain Assessment: 0-10 Pain  Score: 5  Pain Location: lt foot Pain Descriptors / Indicators: Guarding;Aching Pain Intervention(s): Limited activity within patient's tolerance;Monitored during session;RN gave pain meds during session    Home Living                      Prior Function            PT Goals (current goals can now be found in the care plan section) Acute Rehab PT Goals Patient Stated Goal: to go to Rehab to get stronger PT Goal Formulation: With patient Time For Goal  Achievement: 06/12/18 Potential to Achieve Goals: Good Progress towards PT goals: Progressing toward goals    Frequency    Min 3X/week      PT Plan Current plan remains appropriate    Co-evaluation              AM-PAC PT "6 Clicks" Daily Activity  Outcome Measure  Difficulty turning over in bed (including adjusting bedclothes, sheets and blankets)?: A Little Difficulty moving from lying on back to sitting on the side of the bed? : A Little Difficulty sitting down on and standing up from a chair with arms (e.g., wheelchair, bedside commode, etc,.)?: A Little Help needed moving to and from a bed to chair (including a wheelchair)?: A Little Help needed walking in hospital room?: A Little Help needed climbing 3-5 steps with a railing? : Total 6 Click Score: 16    End of Session   Activity Tolerance: Patient limited by fatigue Patient left: in bed;with call bell/phone within reach   PT Visit Diagnosis: Unsteadiness on feet (R26.81);Muscle weakness (generalized) (M62.81);Pain Pain - Right/Left: Left Pain - part of body: Ankle and joints of foot     Time: 4010-2725 PT Time Calculation (min) (ACUTE ONLY): 28 min  Charges:  $Gait Training: 8-22 mins $Therapeutic Activity: 8-22 mins                     Deniece Ree PT, DPT, CBIS  Supplemental Physical Therapist [place]    Pager [phone] Acute Rehab Office [phone]

## 2018-06-05 NOTE — Clinical Social Work Note (Addendum)
PASARR still pending.  Dayton Scrape, CSW [phone]  10:15 am PASARR obtained: [id number] F. Expires 09/03/2018.   Dayton Scrape, CSW [phone]  11:59 am Per PA, patient should be stable for discharge tomorrow with wound vac that will need to be changed three times per week. SNF is aware and agreeable.  Dayton Scrape, [place]

## 2018-06-06 ENCOUNTER — Encounter (HOSPITAL_COMMUNITY): Payer: Self-pay | Admitting: Certified Registered"

## 2018-06-06 ENCOUNTER — Encounter (HOSPITAL_COMMUNITY)
Admission: RE | Disposition: A | Discharge status: Skilled Nursing Facility | Payer: Self-pay | Source: Ambulatory Visit | Attending: Vascular Surgery

## 2018-06-06 ENCOUNTER — Inpatient Hospital Stay (HOSPITAL_COMMUNITY): Payer: Medicare Other | Admitting: Certified Registered"

## 2018-06-06 DIAGNOSIS — I9789 Other postprocedural complications and disorders of the circulatory system, not elsewhere classified: Secondary | ICD-10-CM

## 2018-06-06 HISTORY — PX: APPLICATION OF WOUND VAC: SHX5189

## 2018-06-06 HISTORY — PX: WOUND DEBRIDEMENT: SHX247

## 2018-06-06 LAB — CBC WITH DIFFERENTIAL/PLATELET
BASOS PCT: 0 %
Basophils Absolute: 0 10*3/uL (ref 0.0–0.1)
Eosinophils Absolute: 0 10*3/uL (ref 0.0–0.5)
Eosinophils Relative: 0 %
HCT: 29.3 % — ABNORMAL LOW (ref 36.0–46.0)
Hemoglobin: 9.2 g/dL — ABNORMAL LOW (ref 12.0–15.0)
Lymphocytes Relative: 11 %
Lymphs Abs: 1 10*3/uL (ref 0.7–4.0)
MCH: 22.7 pg — AB (ref 26.0–34.0)
MCHC: 31.4 g/dL (ref 30.0–36.0)
MCV: 72.3 fL — AB (ref 80.0–100.0)
MONO ABS: 0.4 10*3/uL (ref 0.1–1.0)
Monocytes Relative: 4 %
NEUTROS ABS: 7.7 10*3/uL (ref 1.7–7.7)
NEUTROS PCT: 85 %
Platelets: 394 10*3/uL (ref 150–400)
RBC: 4.05 MIL/uL (ref 3.87–5.11)
RDW: 16.5 % — AB (ref 11.5–15.5)
WBC: 9.1 10*3/uL (ref 4.0–10.5)
nRBC: 0 % (ref 0.0–0.2)
nRBC: 0 /100 WBC

## 2018-06-06 LAB — GLUCOSE, CAPILLARY
GLUCOSE-CAPILLARY: 146 mg/dL — AB (ref 70–99)
Glucose-Capillary: 149 mg/dL — ABNORMAL HIGH (ref 70–99)
Glucose-Capillary: 149 mg/dL — ABNORMAL HIGH (ref 70–99)
Glucose-Capillary: 141 mg/dL — ABNORMAL HIGH (ref 70–99)
Glucose-Capillary: 132 mg/dL — ABNORMAL HIGH (ref 70–99)

## 2018-06-06 SURGERY — DEBRIDEMENT, WOUND
Anesthesia: General | Site: Foot | Laterality: Left

## 2018-06-06 MED ORDER — ONDANSETRON HCL 4 MG/2ML IJ SOLN
INTRAMUSCULAR | Status: AC
  Filled 2018-06-06: qty 2

## 2018-06-06 MED ORDER — HYDROMORPHONE HCL 1 MG/ML IJ SOLN
0.5000 mg | INTRAMUSCULAR | Status: DC | PRN
  Administered 2018-06-06 – 2018-06-10 (×9): 0.5 mg via INTRAVENOUS
  Filled 2018-06-06 (×9): qty 1

## 2018-06-06 MED ORDER — PHENYLEPHRINE 40 MCG/ML (10ML) SYRINGE FOR IV PUSH (FOR BLOOD PRESSURE SUPPORT)
PREFILLED_SYRINGE | INTRAVENOUS | Status: AC
  Filled 2018-06-06: qty 10

## 2018-06-06 MED ORDER — SUCCINYLCHOLINE CHLORIDE 200 MG/10ML IV SOSY
PREFILLED_SYRINGE | INTRAVENOUS | Status: DC | PRN
  Administered 2018-06-06: 120 mg via INTRAVENOUS

## 2018-06-06 MED ORDER — CEFAZOLIN SODIUM-DEXTROSE 2-4 GM/100ML-% IV SOLN
INTRAVENOUS | Status: AC
  Filled 2018-06-06: qty 100

## 2018-06-06 MED ORDER — PROPOFOL 10 MG/ML IV BOLUS
INTRAVENOUS | Status: AC
  Filled 2018-06-06: qty 20

## 2018-06-06 MED ORDER — FENTANYL CITRATE (PF) 100 MCG/2ML IJ SOLN
INTRAMUSCULAR | Status: DC | PRN
  Administered 2018-06-06 (×2): 50 ug via INTRAVENOUS
  Administered 2018-06-06 (×2): 25 ug via INTRAVENOUS

## 2018-06-06 MED ORDER — 0.9 % SODIUM CHLORIDE (POUR BTL) OPTIME
TOPICAL | Status: DC | PRN
  Administered 2018-06-06: 1000 mL

## 2018-06-06 MED ORDER — OXYCODONE HCL 5 MG PO TABS: 5.0000 mg | ORAL_TABLET | Freq: Once | ORAL | Status: DC | PRN

## 2018-06-06 MED ORDER — LIDOCAINE 2% (20 MG/ML) 5 ML SYRINGE
INTRAMUSCULAR | Status: AC
  Filled 2018-06-06: qty 5

## 2018-06-06 MED ORDER — ONDANSETRON HCL 4 MG/2ML IJ SOLN
INTRAMUSCULAR | Status: DC | PRN
  Administered 2018-06-06: 4 mg via INTRAVENOUS

## 2018-06-06 MED ORDER — GLYCOPYRROLATE PF 0.2 MG/ML IJ SOSY
PREFILLED_SYRINGE | INTRAMUSCULAR | Status: AC
  Filled 2018-06-06: qty 1

## 2018-06-06 MED ORDER — GLYCOPYRROLATE PF 0.2 MG/ML IJ SOSY
PREFILLED_SYRINGE | INTRAMUSCULAR | Status: DC | PRN
  Administered 2018-06-06: .1 mg via INTRAVENOUS

## 2018-06-06 MED ORDER — MIDAZOLAM HCL 2 MG/2ML IJ SOLN
INTRAMUSCULAR | Status: AC
  Filled 2018-06-06: qty 2

## 2018-06-06 MED ORDER — MEPERIDINE HCL 50 MG/ML IJ SOLN: 6.2500 mg | INTRAMUSCULAR | Status: DC | PRN

## 2018-06-06 MED ORDER — DEXAMETHASONE SODIUM PHOSPHATE 10 MG/ML IJ SOLN
INTRAMUSCULAR | Status: AC
  Filled 2018-06-06: qty 1

## 2018-06-06 MED ORDER — LIDOCAINE 2% (20 MG/ML) 5 ML SYRINGE
INTRAMUSCULAR | Status: DC | PRN
  Administered 2018-06-06: 80 mg via INTRAVENOUS

## 2018-06-06 MED ORDER — PROPOFOL 10 MG/ML IV BOLUS
INTRAVENOUS | Status: DC | PRN
  Administered 2018-06-06: 200 mg via INTRAVENOUS

## 2018-06-06 MED ORDER — MIDAZOLAM HCL 5 MG/5ML IJ SOLN
INTRAMUSCULAR | Status: DC | PRN
  Administered 2018-06-06: 2 mg via INTRAVENOUS

## 2018-06-06 MED ORDER — FENTANYL CITRATE (PF) 250 MCG/5ML IJ SOLN
INTRAMUSCULAR | Status: AC
  Filled 2018-06-06: qty 5

## 2018-06-06 MED ORDER — SUCCINYLCHOLINE CHLORIDE 200 MG/10ML IV SOSY
PREFILLED_SYRINGE | INTRAVENOUS | Status: AC
  Filled 2018-06-06: qty 10

## 2018-06-06 MED ORDER — OXYCODONE HCL 5 MG/5ML PO SOLN: 5.0000 mg | Freq: Once | ORAL | Status: DC | PRN

## 2018-06-06 MED ORDER — DEXAMETHASONE SODIUM PHOSPHATE 4 MG/ML IJ SOLN
INTRAMUSCULAR | Status: DC | PRN
  Administered 2018-06-06: 6 mg via INTRAVENOUS

## 2018-06-06 MED ORDER — PROMETHAZINE HCL 25 MG/ML IJ SOLN: 6.2500 mg | INTRAMUSCULAR | Status: DC | PRN

## 2018-06-06 MED ORDER — HYDROMORPHONE HCL 1 MG/ML IJ SOLN
INTRAMUSCULAR | Status: AC
  Filled 2018-06-06: qty 1

## 2018-06-06 MED ORDER — HEPARIN SODIUM (PORCINE) 5000 UNIT/ML IJ SOLN
5000.0000 [IU] | Freq: Three times a day (TID) | INTRAMUSCULAR | Status: DC
  Administered 2018-06-07 – 2018-06-08 (×3): 5000 [IU] via SUBCUTANEOUS
  Filled 2018-06-06 (×5): qty 1

## 2018-06-06 MED ORDER — HYDROMORPHONE HCL 1 MG/ML IJ SOLN
0.2500 mg | INTRAMUSCULAR | Status: DC | PRN
  Administered 2018-06-06 (×2): 0.5 mg via INTRAVENOUS

## 2018-06-06 SURGICAL SUPPLY — 27 items
CANISTER SUCT 3000ML PPV (MISCELLANEOUS) ×6 IMPLANT
COVER SURGICAL LIGHT HANDLE (MISCELLANEOUS) ×3 IMPLANT
COVER WAND RF STERILE (DRAPES) ×3 IMPLANT
DRAPE EXTREMITY ABC'S (DRAPES) ×1
DRAPE EXTREMITY ABCS (DRAPES) ×1 IMPLANT
DRSG VAC ATS LRG SENSATRAC (GAUZE/BANDAGES/DRESSINGS) IMPLANT
DRSG VAC ATS MED SENSATRAC (GAUZE/BANDAGES/DRESSINGS) IMPLANT
DRSG VAC ATS SM SENSATRAC (GAUZE/BANDAGES/DRESSINGS) ×4 IMPLANT
ELECT REM PT RETURN 9FT ADLT (ELECTROSURGICAL) ×3
ELECTRODE REM PT RTRN 9FT ADLT (ELECTROSURGICAL) ×1 IMPLANT
GLOVE BIO SURGEON STRL SZ7.5 (GLOVE) ×3 IMPLANT
GOWN STRL REUS W/ TWL LRG LVL3 (GOWN DISPOSABLE) ×3 IMPLANT
GOWN STRL REUS W/ TWL XL LVL3 (GOWN DISPOSABLE) ×1 IMPLANT
GOWN STRL REUS W/TWL LRG LVL3 (GOWN DISPOSABLE) ×9
GOWN STRL REUS W/TWL XL LVL3 (GOWN DISPOSABLE) ×3
HANDPIECE INTERPULSE COAX TIP (DISPOSABLE)
KIT BASIN OR (CUSTOM PROCEDURE TRAY) ×3 IMPLANT
KIT TURNOVER KIT B (KITS) ×3 IMPLANT
NS IRRIG 1000ML POUR BTL (IV SOLUTION) ×3 IMPLANT
PACK GENERAL/GYN (CUSTOM PROCEDURE TRAY) ×3 IMPLANT
PACK UNIVERSAL I (CUSTOM PROCEDURE TRAY) IMPLANT
PAD ARMBOARD 7.5X6 YLW CONV (MISCELLANEOUS) ×6 IMPLANT
PAD NEG PRESSURE SENSATRAC (MISCELLANEOUS) ×3 IMPLANT
SET HNDPC FAN SPRY TIP SCT (DISPOSABLE) IMPLANT
TOWEL GREEN STERILE (TOWEL DISPOSABLE) ×3 IMPLANT
WATER STERILE IRR 1000ML POUR (IV SOLUTION) IMPLANT
WND VAC CANISTER 500ML (MISCELLANEOUS) ×2 IMPLANT

## 2018-06-06 NOTE — Op Note (Signed)
    [name] MRN: [id number] DOB: 30-Jan-1971 Sex: female  06/06/2018 Pre-operative Diagnosis: left foot wound Post-operative diagnosis:  Same Surgeon:  Erlene Quan C. Donzetta Matters, MD Assistant: Laurence Slate, PA Procedure Performed: Excisional debridement left postsurgical wound to 5.5 x 3 x 2 cm   Indications: 47 year old female has undergone left 3 through 5 toe amputations after revascularization procedure.  She has palpable dorsalis pedis pulse on the foot.  She is indicated for debridement given breakdown of the wound.  Procedure:  The patient was identified in the holding area and taken to the operating room where she is placed supine the operating table and general anesthesia was induced.  She was sterilely prepped and draped in the left foot lower extremity in usual fashion antibiotics were administered and timeout was called.  We began with removal of the existing sutures where we encountered what appeared to be mostly healthy tissue.  We debrided sharply with 10 blade back to bleeding tissue and removed skin edges.  Final size was 5.5 x 3 x 2 cm.  The fourth metatarsal bone was trimmed back with rongeur.  The wound was irrigated thoroughly hemostasis was obtained and a wound VAC was placed.  She tolerated the procedure well without immediate complication.  EBL 20 cc.    [name] C. Donzetta Matters, MD [facility] [place] Office: [phone] Pager: [phone]

## 2018-06-06 NOTE — Progress Notes (Signed)
Patient back from OR with VAC to l foot, clean, dry and intact. CCMD notified of return after telemetry placed and reoriented to room. Will continue to monitor.

## 2018-06-06 NOTE — Progress Notes (Signed)
CPAP at bedside- Pt requested to not place at this time- will call when she is ready for CPAP placement.

## 2018-06-06 NOTE — Progress Notes (Signed)
    Bleeding through dressing.  Placed hemostatic agent on bleeding skin edges and compression dressing with 4 x 4 kerlex and ace.  Left foot elevated.  Roxy Horseman PA-C

## 2018-06-06 NOTE — Anesthesia Postprocedure Evaluation (Signed)
Anesthesia Post Note  Patient: Diamond Rice  Procedure(s) Performed: DEBRIDEMENT WOUND LEFT FOOT (Left Foot) APPLICATION OF WOUND VAC (Left Foot)     Patient location during evaluation: PACU Anesthesia Type: General Level of consciousness: awake and alert Pain management: pain level controlled Vital Signs Assessment: post-procedure vital signs reviewed and stable Respiratory status: spontaneous breathing, nonlabored ventilation and respiratory function stable Cardiovascular status: blood pressure returned to baseline and stable Postop Assessment: no apparent nausea or vomiting Anesthetic complications: no    Last Vitals:  Vitals:   06/06/18 0858 06/06/18 0901  BP:    Pulse: 66   Resp: 12   Temp:  36.7 C  SpO2: 96%     Last Pain:  Vitals:   06/06/18 0830  TempSrc:   PainSc: 10-Worst pain ever                 Lynda Rainwater

## 2018-06-06 NOTE — NC FL2 (Signed)
[place] LEVEL OF CARE SCREENING TOOL     IDENTIFICATION  [name] Birthdate: 1971-07-27 Sex: female Admission Date (Current Location): 05/27/2018  [facility] and [place] Number:  Herbalist and Address:  [facility] [place], [place] [address] [place], [place] [address]      Provider Number: [id number]  Attending Physician Name and Address:  [place], [place] Name and Phone Number:       Current Level of Care: Hospital Recommended Level of Care: [place] Prior Approval Number:    Date Approved/Denied:   PASRR Number: [id number] F. Expires 09/03/2018  Discharge Plan: SNF    Current Diagnoses: Patient Active Problem List   Diagnosis Date Noted  . Critical lower limb ischemia 05/27/2018  . GI bleeding 04/02/2018  . Acute renal failure (ARF) ([place]) 04/02/2018  . Hypokalemia 04/02/2018  . Polysubstance abuse ([place]) 04/02/2018  . Diabetic foot ulcer ([place]) 04/02/2018  . Cannabis use disorder, moderate, dependence ([place]) 05/19/2017  . Cocaine use disorder, moderate, dependence ([place]) 05/19/2017  . Schizoaffective disorder, bipolar type ([place]) 10/07/2015  . OSA (obstructive sleep apnea) 06/09/2015  . Vitamin D deficiency 01/06/2015  . Bilateral knee pain 01/05/2015  . Numbness and tingling in right hand 01/05/2015  . Insomnia 12/07/2014  . Right-sided lacunar infarction ([place]) 09/15/2014  . Left hemiparesis ([place]) 09/15/2014  . Dyspnea   . Back pain 09/11/2013  . Psychoactive substance-induced organic mood disorder ([place]) 05/28/2013  . Cocaine abuse with cocaine-induced mood disorder ([place]) 05/28/2013  . Cannabis dependence with cannabis-induced anxiety disorder ([place]) 05/28/2013  . Morbid obesity with BMI of 45.0-49.9, adult ([place]) 04/25/2012  . Chest pain 04/17/2012  . Hypertension 04/17/2012  . Diabetes mellitus type 2 with complications, uncontrolled ([place]) 04/17/2012  . Hyperlipidemia  04/17/2012  . Tobacco use 04/17/2012    Orientation RESPIRATION BLADDER Height & Weight     Self, Time, Situation, Place  Normal(Cpap) Continent Weight: 212 lb 4.9 oz (96.3 kg) Height:  5\' 1"  (154.9 cm)  BEHAVIORAL SYMPTOMS/MOOD NEUROLOGICAL BOWEL NUTRITION STATUS  (None)   Continent Diet(Heart healthy/carb modified)  AMBULATORY STATUS COMMUNICATION OF NEEDS Skin   Limited Assist Verbally Wound Vac, Surgical wounds(Wound vac on left foot. Wound measurements are 5.5 x 3 x 2 cm.)                       Personal Care Assistance Level of Assistance  Bathing, Feeding, Dressing Bathing Assistance: Limited assistance Feeding assistance: Independent Dressing Assistance: Limited assistance     Functional Limitations Info  Sight, Hearing, Speech Sight Info: Adequate Hearing Info: Adequate Speech Info: Adequate    SPECIAL CARE FACTORS FREQUENCY  PT (By licensed PT)     PT Frequency: 5 x week OT Frequency: 5x wk            Contractures Contractures Info: Not present    Additional Factors Info  Code Status, Allergies Code Status Info: Full Allergies Info: Morphine and related, Metformin.           Current Medications (06/06/2018):  This is the current hospital active medication list Current Facility-Administered Medications  Medication Dose Route Frequency Provider Last Rate Last Dose  . 0.9 %  sodium chloride infusion  250 mL Intravenous PRN Laurence Slate M, PA-C      . acetaminophen (TYLENOL) tablet 650 mg  650 mg Oral Q4H PRN Laurence Slate M, PA-C      . ARIPiprazole (ABILIFY) tablet  15 mg  15 mg Oral QHS Laurence Slate M, [place]   15 mg at 06/05/18 2131  . aspirin EC tablet 81 mg  81 mg Oral Daily Laurence Slate M, PA-C   81 mg at 06/06/18 1004  . cloNIDine (CATAPRES) tablet 0.3 mg  0.3 mg Oral TID Laurence Slate M, PA-C   0.3 mg at 06/06/18 1005  . clopidogrel (PLAVIX) tablet 75 mg  75 mg Oral Q breakfast Laurence Slate M, PA-C   75 mg at 06/05/18 6599  . gabapentin  (NEURONTIN) capsule 100 mg  100 mg Oral TID Ulyses Amor, PA-C   100 mg at 06/05/18 2131  . heparin injection 5,000 Units  5,000 Units Subcutaneous Q8H Rhyne, Samantha J, PA-C      . hydrALAZINE (APRESOLINE) tablet 50 mg  50 mg Oral Q8H CollinsTerrence Dupont M, PA-C   50 mg at 06/06/18 0601  . hydrochlorothiazide (MICROZIDE) capsule 12.5 mg  12.5 mg Oral Daily Laurence Slate M, PA-C   12.5 mg at 06/06/18 1004  . HYDROmorphone (DILAUDID) 1 MG/ML injection           . HYDROmorphone (DILAUDID) injection 0.5 mg  0.5 mg Intravenous Q2H PRN Rhyne, Samantha J, PA-C   0.5 mg at 06/06/18 1243  . insulin aspart (novoLOG) injection 0-5 Units  0-5 Units Subcutaneous QHS Ulyses Amor, [place]   2 Units at 06/01/18 2137  . insulin aspart (novoLOG) injection 0-9 Units  0-9 Units Subcutaneous TID WC Ulyses Amor, PA-C   1 Units at 06/06/18 3570  . insulin detemir (LEVEMIR) injection 15 Units  15 Units Subcutaneous BID Ulyses Amor, [place]   15 Units at 06/05/18 2132  . lactated ringers infusion   Intravenous Continuous Ulyses Amor, PA-C 10 mL/hr at 06/06/18 1255    . lisinopril (PRINIVIL,ZESTRIL) tablet 20 mg  20 mg Oral Daily Laurence Slate M, PA-C   20 mg at 06/06/18 1004  . magnesium hydroxide (MILK OF MAGNESIA) suspension 30 mL  30 mL Oral Daily PRN Ulyses Amor, PA-C   30 mL at 06/03/18 1213  . ondansetron (ZOFRAN) injection 4 mg  4 mg Intravenous Q6H PRN Laurence Slate M, PA-C   4 mg at 06/05/18 1035  . oxyCODONE (Oxy IR/ROXICODONE) immediate release tablet 5-10 mg  5-10 mg Oral Q4H PRN Laurence Slate M, PA-C   10 mg at 06/06/18 1006  . pentoxifylline (TRENTAL) CR tablet 400 mg  400 mg Oral TID WC Laurence Slate M, PA-C   400 mg at 06/06/18 1005  . potassium chloride (K-DUR,KLOR-CON) CR tablet 20 mEq  20 mEq Oral Daily Laurence Slate M, PA-C   20 mEq at 06/06/18 1004  . rosuvastatin (CRESTOR) tablet 10 mg  10 mg Oral q1800 Ulyses Amor, PA-C   10 mg at 06/05/18 1744  . sodium chloride flush (NS) 0.9 %  injection 3 mL  3 mL Intravenous Q12H Laurence Slate M, PA-C   3 mL at 06/04/18 2227  . sodium chloride flush (NS) 0.9 % injection 3 mL  3 mL Intravenous PRN Ulyses Amor, PA-C         Discharge Medications: Please see discharge summary for a list of discharge medications.  Relevant Imaging Results:  Relevant Lab Results:   Additional Information SS#: [id number]  Candie Chroman, LCSW

## 2018-06-06 NOTE — Anesthesia Preprocedure Evaluation (Signed)
Anesthesia Evaluation  Patient identified by MRN, date of birth, ID band Patient awake    Reviewed: Allergy & Precautions, H&P , NPO status , Patient's Chart, lab work & pertinent test results, reviewed documented beta blocker date and time   Airway Mallampati: II  TM Distance: >3 FB Neck ROM: Full    Dental no notable dental hx. (+) Teeth Intact, Dental Advisory Given   Pulmonary sleep apnea and Continuous Positive Airway Pressure Ventilation , COPD, Current Smoker,    Pulmonary exam normal breath sounds clear to auscultation       Cardiovascular hypertension, Pt. on medications  Rhythm:Regular Rate:Normal     Neuro/Psych  Headaches, Anxiety Depression Bipolar Disorder Schizophrenia    GI/Hepatic negative GI ROS, Neg liver ROS,   Endo/Other  diabetes, Type 1, Insulin DependentMorbid obesity  Renal/GU negative Renal ROS  negative genitourinary   Musculoskeletal   Abdominal (+) + obese,   Peds  Hematology negative hematology ROS (+)   Anesthesia Other Findings   Reproductive/Obstetrics negative OB ROS                             Anesthesia Physical  Anesthesia Plan  ASA: III  Anesthesia Plan: General   Post-op Pain Management:    Induction: Intravenous  PONV Risk Score and Plan: 2 and Ondansetron and Midazolam  Airway Management Planned: LMA  Additional Equipment:   Intra-op Plan:   Post-operative Plan: Extubation in OR  Informed Consent: I have reviewed the patients History and Physical, chart, labs and discussed the procedure including the risks, benefits and alternatives for the proposed anesthesia with the patient or authorized representative who has indicated his/her understanding and acceptance.   Dental advisory given  Plan Discussed with: CRNA, Anesthesiologist and Surgeon  Anesthesia Plan Comments:         Anesthesia Quick Evaluation

## 2018-06-06 NOTE — Transfer of Care (Signed)
Immediate Anesthesia Transfer of Care Note  Patient: Diamond Rice  Procedure(s) Performed: DEBRIDEMENT WOUND LEFT FOOT (Left Foot) APPLICATION OF WOUND VAC (Left Foot)  Patient Location: PACU  Anesthesia Type:General  Level of Consciousness: awake, oriented and patient cooperative  Airway & Oxygen Therapy: Patient Spontanous Breathing and Patient connected to face mask oxygen  Post-op Assessment: Report given to RN, Post -op Vital signs reviewed and stable and Patient moving all extremities X 4  Post vital signs: Reviewed and stable  Last Vitals:  Vitals Value Taken Time  BP 161/99 06/06/2018  8:16 AM  Temp    Pulse 88 06/06/2018  8:18 AM  Resp 24 06/06/2018  8:18 AM  SpO2 100 % 06/06/2018  8:18 AM  Vitals shown include unvalidated device data.  Last Pain:  Vitals:   06/06/18 0543  TempSrc: Oral  PainSc:       Patients Stated Pain Goal: 4 (57/90/38 3338)  Complications: No apparent anesthesia complications

## 2018-06-06 NOTE — Progress Notes (Signed)
Called to evaluate oozing from her left foot dressing after OR earlier today with Dr. Donzetta Matters.  Took down her Kerlix and Ace bandage but left the underlying hemostatic agent that was already in place.  I did not want to disrupt what clot had formed at this point.  Subsequently sprayed thrombin spray on the underlying snow (hemostatic agent).  Subsequently wrapped her foot with a Kerlix and Ace bandage for added hemostasis.  We will check CBC.  Marty Heck, MD [facility] [place] Office: [phone] Pager: [phone]

## 2018-06-06 NOTE — Progress Notes (Signed)
L foot surgical site VAC dressing w oozing blood from under dressing. VAC discontinued for now and dressing applied by Leontine Locket in room. Patient tolerated well w 0.5mg  IV dilaudid. Dressing of 4x4s, kerlix and ace wrap now intact and dry and L foot elevated.

## 2018-06-06 NOTE — Progress Notes (Signed)
L foot continuing to have moderate bloody drainage. Dr Carlis Abbott notified.

## 2018-06-06 NOTE — Progress Notes (Signed)
  Progress Note    06/06/2018 7:12 AM Day of Surgery  Subjective:  No new complaints  Vitals:   06/05/18 1948 06/06/18 0543  BP: (!) 180/97 (!) 159/95  Pulse: 82 74  Resp: 15 (!) 32  Temp: 98.3 F (36.8 C) 98.5 F (36.9 C)  SpO2: 95% 100%    Physical Exam: aaox3 Non labored respirations Palpable left dp Left foot wound with drainage from central aspect  CBC    Component Value Date/Time   WBC 8.3 06/01/2018 0301   RBC 3.88 06/01/2018 0301   HGB 9.0 (L) 06/01/2018 0301   HCT 28.1 (L) 06/01/2018 0301   PLT 291 06/01/2018 0301   MCV 72.4 (L) 06/01/2018 0301   MCH 23.2 (L) 06/01/2018 0301   MCHC 32.0 06/01/2018 0301   RDW 16.7 (H) 06/01/2018 0301   LYMPHSABS 2.8 05/30/2018 0801   MONOABS 1.2 (H) 05/30/2018 0801   EOSABS 0.4 05/30/2018 0801   BASOSABS 0.0 05/30/2018 0801    BMET    Component Value Date/Time   NA 136 06/01/2018 0301   K 3.9 06/01/2018 0301   CL 106 06/01/2018 0301   CO2 22 06/01/2018 0301   GLUCOSE 195 (H) 06/01/2018 0301   BUN 11 06/01/2018 0301   CREATININE 1.08 (H) 06/01/2018 0301   CREATININE 1.81 (H) 05/23/2018 1620   CALCIUM 8.6 (L) 06/01/2018 0301   GFRNONAA >60 06/01/2018 0301   GFRNONAA 54 (L) 09/28/2014 1034   GFRAA >60 06/01/2018 0301   GFRAA 62 09/28/2014 1034    INR    Component Value Date/Time   INR 0.96 04/02/2018 0158     Intake/Output Summary (Last 24 hours) at 06/06/2018 5643 Last data filed at 06/06/2018 0558 Gross per 24 hour  Intake 240 ml  Output 1650 ml  Net -1410 ml     Assessment:  47 y.o. female is s/p left 3-5 toes amputation  Plan: OR today for debridement with possible wound vac placement    Katherinne Mofield C. Donzetta Matters, MD [facility] [place] Office: [phone] Pager: [phone]  06/06/2018 7:12 AM

## 2018-06-06 NOTE — Anesthesia Procedure Notes (Signed)
Procedure Name: Intubation Date/Time: 06/06/2018 7:40 AM Performed by: Orlie Dakin, CRNA Pre-anesthesia Checklist: Patient identified, Emergency Drugs available, Suction available and Patient being monitored Patient Re-evaluated:Patient Re-evaluated prior to induction Oxygen Delivery Method: Circle system utilized Preoxygenation: Pre-oxygenation with 100% oxygen Induction Type: IV induction Ventilation: Mask ventilation without difficulty Laryngoscope Size: Miller and 3 Grade View: Grade I Tube type: Oral Tube size: 7.0 mm Number of attempts: 1 Airway Equipment and Method: Stylet Placement Confirmation: ETT inserted through vocal cords under direct vision,  positive ETCO2 and breath sounds checked- equal and bilateral Secured at: 22 cm Tube secured with: Tape Dental Injury: Teeth and Oropharynx as per pre-operative assessment  Comments: 4x4s bite block used.

## 2018-06-06 NOTE — Progress Notes (Signed)
Pt placed on CPAP for the night- tolerating well. 

## 2018-06-07 ENCOUNTER — Encounter (HOSPITAL_COMMUNITY): Payer: Self-pay | Admitting: Vascular Surgery

## 2018-06-07 LAB — GLUCOSE, CAPILLARY
GLUCOSE-CAPILLARY: 131 mg/dL — AB (ref 70–99)
GLUCOSE-CAPILLARY: 144 mg/dL — AB (ref 70–99)
Glucose-Capillary: 120 mg/dL — ABNORMAL HIGH (ref 70–99)
Glucose-Capillary: 102 mg/dL — ABNORMAL HIGH (ref 70–99)

## 2018-06-07 NOTE — Progress Notes (Signed)
Report given to Kennieth Rad, RN at [facility].  Advised that pt is coming with compression dressing on L foot and will need to have would vac applied when she arrives at facility.  Per social work, the Systems developer at [employer] is aware and should have arranged for vac available on arrival.

## 2018-06-07 NOTE — Progress Notes (Signed)
  Progress Note    06/07/2018 8:10 AM 1 Day Post-Op  Subjective:  Minimal pain L foot   Vitals:   06/06/18 1955 06/07/18 0403  BP: (!) 155/103 127/80  Pulse: 89 67  Resp: (!) 30 17  Temp: 98.4 F (36.9 C) 97.6 F (36.4 C)  SpO2: 99% 100%   Physical Exam Lungs:  Non labored Incisions:  Dressing left in place L foot Extremities:  Palpable L ATA Abdomen:  Soft Neurologic: A&O  CBC    Component Value Date/Time   WBC 9.1 06/06/2018 1943   RBC 4.05 06/06/2018 1943   HGB 9.2 (L) 06/06/2018 1943   HCT 29.3 (L) 06/06/2018 1943   PLT 394 06/06/2018 1943   MCV 72.3 (L) 06/06/2018 1943   MCH 22.7 (L) 06/06/2018 1943   MCHC 31.4 06/06/2018 1943   RDW 16.5 (H) 06/06/2018 1943   LYMPHSABS 1.0 06/06/2018 1943   MONOABS 0.4 06/06/2018 1943   EOSABS 0.0 06/06/2018 1943   BASOSABS 0.0 06/06/2018 1943    BMET    Component Value Date/Time   NA 136 06/01/2018 0301   K 3.9 06/01/2018 0301   CL 106 06/01/2018 0301   CO2 22 06/01/2018 0301   GLUCOSE 195 (H) 06/01/2018 0301   BUN 11 06/01/2018 0301   CREATININE 1.08 (H) 06/01/2018 0301   CREATININE 1.81 (H) 05/23/2018 1620   CALCIUM 8.6 (L) 06/01/2018 0301   GFRNONAA >60 06/01/2018 0301   GFRNONAA 54 (L) 09/28/2014 1034   GFRAA >60 06/01/2018 0301   GFRAA 62 09/28/2014 1034    INR    Component Value Date/Time   INR 0.96 04/02/2018 0158     Intake/Output Summary (Last 24 hours) at 06/07/2018 0810 Last data filed at 06/07/2018 0645 Gross per 24 hour  Intake 300 ml  Output 1740 ml  Net -1440 ml     Assessment/Plan:  48 y.o. female is s/p L 3-5 toe amp with subsequent debridement 1 Day Post-Op   Bleeding from surgical site overnight Will take down dressing later today and decide on wound vac vs continued wet to dry Likely d/c to SNF later today if surgical wound is hemostatic   Dagoberto Ligas, PA-C [facility] [phone] 06/07/2018 8:10 AM

## 2018-06-07 NOTE — Progress Notes (Signed)
CSW following patient for support and discharge needs. Patient has bed and facility is ready to take patient once medically cleared by MD.   [name], MSW,  [place]

## 2018-06-07 NOTE — Progress Notes (Signed)
Physical Therapy Treatment Patient Details Name: Diamond Rice MRN: [id number] DOB: 07-09-1971 Today's Date: 06/07/2018    History of Present Illness 47 year old female with a long-standing wound on her left foot involving her third and fourth toe with exposed tendon.  She underwent revascularization of her anterior tibial artery and is now indicated for amputation of third, fourth, and fifth toes on 05/28/18. PMH:  Sleep apnea, COPD, smoker, DM, Bipolar, HTN, anxiety.     PT Comments    Session focused on bed level exercises after discussion with RN, Estill Bamberg; per vascular PA, patient on bedrest today due to anticoagulation meds. Also plans to receive wound vac later this afternoon. Pt able to perform upper and lower extremity therapeutic exercises for strengthening. Continues with good motivation and participation. D/c plan remains appropriate.    Follow Up Recommendations  SNF;Supervision/Assistance - 24 hour     Equipment Recommendations  Other (comment)(TBA)    Recommendations for Other Services       Precautions / Restrictions Precautions Precautions: Fall Precaution Comments: Bedrest on 10/11 per vascular PA Required Braces or Orthoses: Other Brace/Splint Other Brace/Splint: post op shoe left foot Restrictions Weight Bearing Restrictions: Yes LLE Weight Bearing: Non weight bearing(weight bear on left heel)    Mobility  Bed Mobility Overal bed mobility: Needs Assistance Bed Mobility: Sit to Supine       Sit to supine: Supervision   General bed mobility comments: increased time but no physical assistance required  Transfers                 General transfer comment: deferred due to bedrest  Ambulation/Gait                 Stairs             Wheelchair Mobility    Modified Rankin (Stroke Patients Only)       Balance Overall balance assessment: Needs assistance Sitting-balance support: No upper extremity supported;Feet  supported Sitting balance-Leahy Scale: Good                                      Cognition Arousal/Alertness: Awake/alert Behavior During Therapy: Flat affect Overall Cognitive Status: Within Functional Limits for tasks assessed                                        Exercises General Exercises - Upper Extremity Shoulder Flexion: 15 reps;Both;Seated Shoulder ABduction: 15 reps;Both;Seated General Exercises - Lower Extremity Ankle Circles/Pumps: 10 reps;Left;Supine Long Arc Quad: Right;Seated;15 reps;Both Heel Slides: 10 reps;Left;Supine;Limitations Heel Slides Limitations: Limited ROM Hip ABduction/ADduction: 10 reps;Left;Supine Straight Leg Raises: 10 reps;Left;AAROM Hip Flexion/Marching: 20 reps;Both;Seated    General Comments  VSS      Pertinent Vitals/Pain Pain Assessment: No/denies pain    Home Living                      Prior Function            PT Goals (current goals can now be found in the care plan section) Acute Rehab PT Goals Patient Stated Goal: to go to Rehab to get stronger PT Goal Formulation: With patient Time For Goal Achievement: 06/12/18 Potential to Achieve Goals: Good Progress towards PT goals: Progressing toward goals    Frequency    Min 3X/week  PT Plan Current plan remains appropriate    Co-evaluation              AM-PAC PT "6 Clicks" Daily Activity  Outcome Measure  Difficulty turning over in bed (including adjusting bedclothes, sheets and blankets)?: Unable Difficulty moving from lying on back to sitting on the side of the bed? : Unable Difficulty sitting down on and standing up from a chair with arms (e.g., wheelchair, bedside commode, etc,.)?: A Little Help needed moving to and from a bed to chair (including a wheelchair)?: A Lot Help needed walking in hospital room?: A Lot Help needed climbing 3-5 steps with a railing? : Total 6 Click Score: 10    End of Session  Equipment Utilized During Treatment: Gait belt Activity Tolerance: Patient limited by fatigue;Patient limited by pain Patient left: with call bell/phone within reach;in bed Nurse Communication: Mobility status PT Visit Diagnosis: Unsteadiness on feet (R26.81);Muscle weakness (generalized) (M62.81);Pain Pain - Right/Left: Left Pain - part of body: Ankle and joints of foot     Time: 1601-0932 PT Time Calculation (min) (ACUTE ONLY): 21 min  Charges:  $Therapeutic Exercise: 8-22 mins                     Ellamae [name], PT, DPT Acute Rehabilitation Services Pager [phone] Office [phone]    Willy Eddy 06/07/2018, 9:32 AM

## 2018-06-07 NOTE — Progress Notes (Signed)
Clinical Social Worker facilitated patient discharge including contacting patient family and facility to confirm patient discharge plans.  Clinical information faxed to facility and family agreeable with plan.  CSW arranged ambulance transport via [facility] to [facility] .  RN to call [phone] (pt will go in rm# [phone]) for report prior to discharge.  Clinical Social Worker will sign off for now as social work intervention is no longer needed. Please consult [place] again if new need arises.  [name], MSW, [place]

## 2018-06-07 NOTE — Progress Notes (Signed)
CPAP at bedside- pt not ready for placement at this time- Will call when ready for CPAP.

## 2018-06-07 NOTE — Progress Notes (Signed)
  Progress Note    06/07/2018 8:08 AM 1 Day Post-Op  Subjective:  Doing ok this morning  Vitals:   06/06/18 1955 06/07/18 0403  BP: (!) 155/103 127/80  Pulse: 89 67  Resp: (!) 30 17  Temp: 98.4 F (36.9 C) 97.6 F (36.4 C)  SpO2: 99% 100%    Physical Exam: aaox3 Non labored respirations Dry dressing on foot no longer soaking through.  CBC    Component Value Date/Time   WBC 9.1 06/06/2018 1943   RBC 4.05 06/06/2018 1943   HGB 9.2 (L) 06/06/2018 1943   HCT 29.3 (L) 06/06/2018 1943   PLT 394 06/06/2018 1943   MCV 72.3 (L) 06/06/2018 1943   MCH 22.7 (L) 06/06/2018 1943   MCHC 31.4 06/06/2018 1943   RDW 16.5 (H) 06/06/2018 1943   LYMPHSABS 1.0 06/06/2018 1943   MONOABS 0.4 06/06/2018 1943   EOSABS 0.0 06/06/2018 1943   BASOSABS 0.0 06/06/2018 1943    BMET    Component Value Date/Time   NA 136 06/01/2018 0301   K 3.9 06/01/2018 0301   CL 106 06/01/2018 0301   CO2 22 06/01/2018 0301   GLUCOSE 195 (H) 06/01/2018 0301   BUN 11 06/01/2018 0301   CREATININE 1.08 (H) 06/01/2018 0301   CREATININE 1.81 (H) 05/23/2018 1620   CALCIUM 8.6 (L) 06/01/2018 0301   GFRNONAA >60 06/01/2018 0301   GFRNONAA 54 (L) 09/28/2014 1034   GFRAA >60 06/01/2018 0301   GFRAA 62 09/28/2014 1034    INR    Component Value Date/Time   INR 0.96 04/02/2018 0158     Intake/Output Summary (Last 24 hours) at 06/07/2018 0808 Last data filed at 06/07/2018 0645 Gross per 24 hour  Intake 300 ml  Output 1740 ml  Net -1440 ml     Assessment:  47 y.o. female is s/p revascularization left lower extremity followed by amputation of toes 3 through 5 now status post debridement  Plan: Will place wound vac today and can dc to snf when available.   Mercer Stallworth C. Donzetta Matters, MD [facility] [place] Office: [phone] Pager: [phone]  06/07/2018 8:08 AM

## 2018-06-07 NOTE — Progress Notes (Addendum)
Pt's left foot dressing has bled through.  ACE wrap removed, and dressing underneath was saturated with blood.  Dressing removed down to 4x4 gauze which was left in place.  New compression dressing applied. MD notified, pt will remain inpatient tonight and vascular team will reassess in the morning.  [facility] notified by charge nurse.  [facility] notified by RN.

## 2018-06-07 NOTE — Progress Notes (Signed)
Vascular PA contacted regarding pt's scheduled anticoagulation meds.  RN to administer meds, pt to remain on bedrest.

## 2018-06-08 LAB — GLUCOSE, CAPILLARY
Glucose-Capillary: 92 mg/dL (ref 70–99)
Glucose-Capillary: 111 mg/dL — ABNORMAL HIGH (ref 70–99)
Glucose-Capillary: 150 mg/dL — ABNORMAL HIGH (ref 70–99)
Glucose-Capillary: 169 mg/dL — ABNORMAL HIGH (ref 70–99)

## 2018-06-08 NOTE — Progress Notes (Signed)
Patient sleeping and had 7 bts V-Tach then back to S.R. Cont. To monitor patient and rhythm.

## 2018-06-08 NOTE — Progress Notes (Signed)
  Progress Note    06/08/2018 10:39 AM 2 Days Post-Op  Subjective: Discharge held yesterday secondary to bleeding from left foot wound  Vitals:   06/07/18 2002 06/08/18 0453  BP: (!) 172/89 (!) 167/98  Pulse: 85 65  Resp: 20 15  Temp: 98.4 F (36.9 C) 98.8 F (37.1 C)  SpO2: 94% 96%    Physical Exam: Awake alert oriented Left foot dressing clean dry intact this morning  CBC    Component Value Date/Time   WBC 9.1 06/06/2018 1943   RBC 4.05 06/06/2018 1943   HGB 9.2 (L) 06/06/2018 1943   HCT 29.3 (L) 06/06/2018 1943   PLT 394 06/06/2018 1943   MCV 72.3 (L) 06/06/2018 1943   MCH 22.7 (L) 06/06/2018 1943   MCHC 31.4 06/06/2018 1943   RDW 16.5 (H) 06/06/2018 1943   LYMPHSABS 1.0 06/06/2018 1943   MONOABS 0.4 06/06/2018 1943   EOSABS 0.0 06/06/2018 1943   BASOSABS 0.0 06/06/2018 1943    BMET    Component Value Date/Time   NA 136 06/01/2018 0301   K 3.9 06/01/2018 0301   CL 106 06/01/2018 0301   CO2 22 06/01/2018 0301   GLUCOSE 195 (H) 06/01/2018 0301   BUN 11 06/01/2018 0301   CREATININE 1.08 (H) 06/01/2018 0301   CREATININE 1.81 (H) 05/23/2018 1620   CALCIUM 8.6 (L) 06/01/2018 0301   GFRNONAA >60 06/01/2018 0301   GFRNONAA 54 (L) 09/28/2014 1034   GFRAA >60 06/01/2018 0301   GFRAA 62 09/28/2014 1034    INR    Component Value Date/Time   INR 0.96 04/02/2018 0158     Intake/Output Summary (Last 24 hours) at 06/08/2018 1039 Last data filed at 06/08/2018 6301 Gross per 24 hour  Intake 480 ml  Output 1450 ml  Net -970 ml     Assessment/plan:  47 y.o. female is s/p revascularization left lower extremity and amputation revision.  She has had bleeding from the wound since surgery.  Wound appears hemostatic today.  We will attempt to replace wound VAC tomorrow.  If I cannot do this we can take her to the operating room to obtain hemostasis and placed back.  I will make her n.p.o. after midnight for this and hold her subcutaneous heparin.    Cassidie Veiga  C. Donzetta Matters, MD [facility] [place] Office: [phone] Pager: [phone]  06/08/2018 10:39 AM

## 2018-06-09 LAB — GLUCOSE, CAPILLARY
GLUCOSE-CAPILLARY: 142 mg/dL — AB (ref 70–99)
Glucose-Capillary: 157 mg/dL — ABNORMAL HIGH (ref 70–99)
Glucose-Capillary: 105 mg/dL — ABNORMAL HIGH (ref 70–99)
Glucose-Capillary: 137 mg/dL — ABNORMAL HIGH (ref 70–99)

## 2018-06-09 LAB — CBC WITH DIFFERENTIAL/PLATELET
Abs Immature Granulocytes: 0.05 10*3/uL (ref 0.00–0.07)
BASOS ABS: 0 10*3/uL (ref 0.0–0.1)
Basophils Relative: 1 %
EOS ABS: 0.2 10*3/uL (ref 0.0–0.5)
EOS PCT: 2 %
HCT: 27 % — ABNORMAL LOW (ref 36.0–46.0)
HEMOGLOBIN: 8.3 g/dL — AB (ref 12.0–15.0)
IMMATURE GRANULOCYTES: 1 %
LYMPHS ABS: 2.3 10*3/uL (ref 0.7–4.0)
LYMPHS PCT: 29 %
MCH: 22.1 pg — ABNORMAL LOW (ref 26.0–34.0)
MCHC: 30.7 g/dL (ref 30.0–36.0)
MCV: 72 fL — ABNORMAL LOW (ref 80.0–100.0)
Monocytes Absolute: 0.9 10*3/uL (ref 0.1–1.0)
Monocytes Relative: 12 %
NEUTROS PCT: 55 %
NRBC: 0 % (ref 0.0–0.2)
Neutro Abs: 4.4 10*3/uL (ref 1.7–7.7)
Platelets: 383 10*3/uL (ref 150–400)
RBC: 3.75 MIL/uL — AB (ref 3.87–5.11)
RDW: 16.9 % — ABNORMAL HIGH (ref 11.5–15.5)
WBC: 7.9 10*3/uL (ref 4.0–10.5)

## 2018-06-09 LAB — BASIC METABOLIC PANEL
Anion gap: 7 (ref 5–15)
BUN: 12 mg/dL (ref 6–20)
CALCIUM: 9.1 mg/dL (ref 8.9–10.3)
CO2: 26 mmol/L (ref 22–32)
CREATININE: 1.25 mg/dL — AB (ref 0.44–1.00)
Chloride: 104 mmol/L (ref 98–111)
GFR, EST AFRICAN AMERICAN: 59 mL/min — AB (ref >60)
GFR, EST NON AFRICAN AMERICAN: 51 mL/min — AB (ref >60)
Glucose, Bld: 150 mg/dL — ABNORMAL HIGH (ref 70–99)
Potassium: 4.2 mmol/L (ref 3.5–5.1)
SODIUM: 137 mmol/L (ref 135–145)

## 2018-06-09 NOTE — Progress Notes (Signed)
Was NPO after MN, Pt had two potato chips and two sips of coke  by mistake.

## 2018-06-09 NOTE — Progress Notes (Signed)
  Progress Note    06/09/2018 10:50 AM 3 Days Post-Op  Subjective: No overnight complaints  Vitals:   06/09/18 0651 06/09/18 0806  BP: (!) 174/86   Pulse: 64 72  Resp: 14 (!) 28  Temp:    SpO2: 100% 99%    Physical Exam: No further bleeding from left foot dressing  CBC    Component Value Date/Time   WBC 7.9 06/09/2018 0239   RBC 3.75 (L) 06/09/2018 0239   HGB 8.3 (L) 06/09/2018 0239   HCT 27.0 (L) 06/09/2018 0239   PLT 383 06/09/2018 0239   MCV 72.0 (L) 06/09/2018 0239   MCH 22.1 (L) 06/09/2018 0239   MCHC 30.7 06/09/2018 0239   RDW 16.9 (H) 06/09/2018 0239   LYMPHSABS 2.3 06/09/2018 0239   MONOABS 0.9 06/09/2018 0239   EOSABS 0.2 06/09/2018 0239   BASOSABS 0.0 06/09/2018 0239    BMET    Component Value Date/Time   NA 137 06/09/2018 0239   K 4.2 06/09/2018 0239   CL 104 06/09/2018 0239   CO2 26 06/09/2018 0239   GLUCOSE 150 (H) 06/09/2018 0239   BUN 12 06/09/2018 0239   CREATININE 1.25 (H) 06/09/2018 0239   CREATININE 1.81 (H) 05/23/2018 1620   CALCIUM 9.1 06/09/2018 0239   GFRNONAA 51 (L) 06/09/2018 0239   GFRNONAA 54 (L) 09/28/2014 1034   GFRAA 59 (L) 06/09/2018 0239   GFRAA 62 09/28/2014 1034    INR    Component Value Date/Time   INR 0.96 04/02/2018 0158     Intake/Output Summary (Last 24 hours) at 06/09/2018 1050 Last data filed at 06/09/2018 0432 Gross per 24 hour  Intake 100 ml  Output 500 ml  Net -400 ml     Assessment:  47 y.o. female is s/p revascularization left lower extremity and amputation of left 3 through 5 toes requiring operative debridement now is had bleeding issues.  Wound remains hemostatic overnight with dressing.  Plan: We will attempt wound VAC change today at bedside.   Brandon C. Donzetta Matters, MD [facility] Office: [phone] Pager: [phone]  06/09/2018 10:50 AM

## 2018-06-09 NOTE — Progress Notes (Signed)
Patient claimed that they were nauseous. RT will continue to monitor throughout the night.

## 2018-06-09 NOTE — Progress Notes (Signed)
Pt complained of severe pain to left foot, prn dilaudid given, hypertensive. Scheduled hydralazine given. Pt stated dressing is too tight in her toes, rewrapped it. Pt stated it feels better . Will continue to monitor.

## 2018-06-10 LAB — GLUCOSE, CAPILLARY
Glucose-Capillary: 167 mg/dL — ABNORMAL HIGH (ref 70–99)
Glucose-Capillary: 160 mg/dL — ABNORMAL HIGH (ref 70–99)

## 2018-06-10 NOTE — Progress Notes (Signed)
[facility] [phone] and gave report to [place], LPN. Gave AVS and report to [facility] who is transporting patient to [place].

## 2018-06-10 NOTE — Progress Notes (Signed)
  Progress Note    06/10/2018 11:02 AM 4 Days Post-Op  Subjective: No overnight issues other than wound VAC alarm which is now been fixed  Vitals:   06/10/18 0533 06/10/18 1034  BP: (!) 182/103 (!) 182/107  Pulse: 73   Resp: 20   Temp: 98.9 F (37.2 C)   SpO2: 97%     Physical Exam: Awake alert oriented Nonlabored respirations Palpable left dorsalis pedis pulse Wound VAC to suction left foot  CBC    Component Value Date/Time   WBC 7.9 06/09/2018 0239   RBC 3.75 (L) 06/09/2018 0239   HGB 8.3 (L) 06/09/2018 0239   HCT 27.0 (L) 06/09/2018 0239   PLT 383 06/09/2018 0239   MCV 72.0 (L) 06/09/2018 0239   MCH 22.1 (L) 06/09/2018 0239   MCHC 30.7 06/09/2018 0239   RDW 16.9 (H) 06/09/2018 0239   LYMPHSABS 2.3 06/09/2018 0239   MONOABS 0.9 06/09/2018 0239   EOSABS 0.2 06/09/2018 0239   BASOSABS 0.0 06/09/2018 0239    BMET    Component Value Date/Time   NA 137 06/09/2018 0239   K 4.2 06/09/2018 0239   CL 104 06/09/2018 0239   CO2 26 06/09/2018 0239   GLUCOSE 150 (H) 06/09/2018 0239   BUN 12 06/09/2018 0239   CREATININE 1.25 (H) 06/09/2018 0239   CREATININE 1.81 (H) 05/23/2018 1620   CALCIUM 9.1 06/09/2018 0239   GFRNONAA 51 (L) 06/09/2018 0239   GFRNONAA 54 (L) 09/28/2014 1034   GFRAA 59 (L) 06/09/2018 0239   GFRAA 62 09/28/2014 1034    INR    Component Value Date/Time   INR 0.96 04/02/2018 0158     Intake/Output Summary (Last 24 hours) at 06/10/2018 1102 Last data filed at 06/10/2018 0900 Gross per 24 hour  Intake 150 ml  Output 1850 ml  Net -1700 ml     Assessment:  46 y.o. female is s/p vascularization amputation left lower extremity now with wound VAC that is working  Plan: Medically cleared for discharge to SNF when bed available.   Koben Daman C. Donzetta Matters, MD [facility] Office: [phone] Pager: [phone]  06/10/2018 11:02 AM

## 2018-06-10 NOTE — Progress Notes (Signed)
Patient will discharge to [facility]. Anticipated discharge date: 06/10/18 Family notified: Skipper Cliche, brother Transportation by: [facility]  Nurse to call report to [phone]. Patient will go to room 212 at the facility.   CSW signing off.  Estanislado Emms, [place]  Clinical Social Worker

## 2018-06-10 NOTE — Clinical Social Work Placement (Signed)
   CLINICAL SOCIAL WORK PLACEMENT  NOTE  Date:  06/10/2018  Patient Details  Name: Diamond Rice MRN: [id number] Date of Birth: 02/22/1971  Clinical Social Work is seeking post-discharge placement for this patient at the [place] level of care (*CSW will initial, date and re-position this form in  chart as items are completed):  Yes   Patient/family provided with [facility] Work Department's list of facilities offering this level of care within the geographic area requested by the patient (or if unable, by the patient's family).  Yes   Patient/family informed of their freedom to choose among providers that offer the needed level of care, that participate in Medicare, Medicaid or managed care program needed by the patient, have an available bed and are willing to accept the patient.  Yes   Patient/family informed of [facility]'s ownership interest in [facility] and [facility], as well as of the fact that they are under no obligation to receive care at these facilities.  PASRR submitted to EDS on 05/30/18     PASRR number received on 06/05/18     Existing PASRR number confirmed on       FL2 transmitted to all facilities in geographic area requested by pt/family on 05/30/18     FL2 transmitted to all facilities within larger geographic area on       Patient informed that his/her managed care company has contracts with or will negotiate with certain facilities, including the following:  [facility]         Patient/family informed of bed offers received.  Patient chooses bed at [facility] recommends and patient chooses bed at      Patient to be transferred to [facility] on 06/10/18.  Patient to be transferred to facility by [facility]     Patient family notified on 06/10/18 of transfer.  Name of family member notified:        PHYSICIAN       Additional Comment:     _______________________________________________ Estanislado Emms, LCSW 06/10/2018, 12:27 PM

## 2018-06-10 NOTE — Progress Notes (Signed)
Wound vac started having low pressure alarm, when assessed wound vac tubing was clogged at the proximal part of the tubing. Negative pressure pad changed, wound vac dressing reinforced. Now vac pressure at 125 MMHG, dranning scant amount of serosanguineous drainage. Pt tolerated well. Will continue to monitor.

## 2018-06-11 ENCOUNTER — Encounter: Payer: Self-pay | Admitting: Internal Medicine

## 2018-06-11 ENCOUNTER — Non-Acute Institutional Stay (SKILLED_NURSING_FACILITY): Payer: Medicare Other | Admitting: Internal Medicine

## 2018-06-11 DIAGNOSIS — IMO0002 Reserved for concepts with insufficient information to code with codable children: Secondary | ICD-10-CM

## 2018-06-11 DIAGNOSIS — E11621 Type 2 diabetes mellitus with foot ulcer: Secondary | ICD-10-CM

## 2018-06-11 DIAGNOSIS — K625 Hemorrhage of anus and rectum: Secondary | ICD-10-CM

## 2018-06-11 DIAGNOSIS — I998 Other disorder of circulatory system: Secondary | ICD-10-CM

## 2018-06-11 DIAGNOSIS — E118 Type 2 diabetes mellitus with unspecified complications: Secondary | ICD-10-CM | POA: Diagnosis not present

## 2018-06-11 DIAGNOSIS — F250 Schizoaffective disorder, bipolar type: Secondary | ICD-10-CM

## 2018-06-11 DIAGNOSIS — I70229 Atherosclerosis of native arteries of extremities with rest pain, unspecified extremity: Secondary | ICD-10-CM

## 2018-06-11 DIAGNOSIS — Z9189 Other specified personal risk factors, not elsewhere classified: Secondary | ICD-10-CM | POA: Insufficient documentation

## 2018-06-11 DIAGNOSIS — E1165 Type 2 diabetes mellitus with hyperglycemia: Secondary | ICD-10-CM

## 2018-06-11 DIAGNOSIS — L97423 Non-pressure chronic ulcer of left heel and midfoot with necrosis of muscle: Secondary | ICD-10-CM

## 2018-06-11 NOTE — Assessment & Plan Note (Signed)
Wound care at SNF 

## 2018-06-11 NOTE — Patient Instructions (Addendum)
See assessment and plan under each diagnosis in the problem list and acutely for this visit Total time 51 minutes; greater than 50% of the visit spent counseling patient and coordinating care for problems addressed at this encounter  

## 2018-06-11 NOTE — Assessment & Plan Note (Signed)
Schedule follow-up with Dr. Donzetta Matters

## 2018-06-11 NOTE — Assessment & Plan Note (Addendum)
Random glucoses @ SNF 270 Monitor on present bid Levemir

## 2018-06-11 NOTE — Progress Notes (Signed)
NURSING HOME LOCATION:  [facility] ROOM NUMBER:  [id number]  CODE STATUS:  Full Code  PCP:  [name], MD  [place] [id number]  This is a comprehensive admission note to [facility] performed on this date less than 30 days from date of admission. Included are preadmission medical/surgical history; reconciled medication list; family history; social history and comprehensive review of systems.  Corrections and additions to the records were documented. Comprehensive physical exam was also performed. Additionally a clinical summary was entered for each active diagnosis pertinent to this admission in the Problem List to enhance continuity of care.  HPI: Patient was hospitalized 9/30-10/14/2019 with gangrene of the toes of the left foot for which amputation of the left third, fourth, and fifth toes was completed 05/28/2018 by Dr. Servando Snare.  This history is in the context of a wound of the left foot present since June.  She had undergone balloon angioplasty of the left anterior tibial artery with 2.5 mm balloon by Dr. Donzetta Matters 05/27/2018.  Postop ABI revealed excellent improvement in perfusion with a palpable dorsalis pedis pulse.  On 10/10 Dr. Donzetta Matters debrided and irrigated the left amputation site.  There was some postop bleeding at the skin edges which was controlled with hemostatic agents topically.  Subsequently the incision was clinically healing well.  Wet-to-dry dressing placement was ordered with wound VAC over the wound with dressing changes 3 times a week.  Final wound size was 5.5 x 3 x 2 cm.  On 10/11 there was ooziing @ the wound & VAC would not retain a seal.  The oozing resolved and wound VAC was replaced on the wound. The patient received 2 units of packed red cells resulting in hemoglobin 9.1.  The patient has a history of GI/rectal bleeding. Colonoscopy had revealed rectal ulcers.  MiraLAX twice a day was initiated indefinitely with  hydrocortisone suppositories once daily for 10 days as per GI.  The patient is on aspirin and Plavix daily. Acute renal injury was felt to be related to the GI bleed  Because of pain control issues she did not participate in physical therapy optimally.  Post discharge SNF placement was recommended for PT/OT.  The patient was more comfortable clinically ambulating with a new postop shoe.  Past medical and surgical history: Includes schizoaffective disorder for which she is on Abilify.  Other medical diagnoses include diabetes mellitus, previous history of stroke, sleep apnea, essential hypertension, dyslipidemia, history of myocardial infarction, depression, COPD, and anxiety disorder.  Colon polypectomy was performed 04/04/2018.  Social history: Nondrinker; one fourth a pack smoker for 28 years  Family history: Strong family history of stroke.   Review of systems: She states that she is not having significant pain and only takes pain medicine before therapy.  She states that she was not checking her sugars prior to admission on a regular basis as she just obtained a glucometer.  She states the few sugars she checked were "90-100".  The last A1c on record was in 2017 with a value of 12.3%.  She denies symptoms related to diabetes.  She is on Levemir 15 units daily.   She also uses a CPAP nightly for obstructive sleep apnea.  She denies any bowel movements for last several days.  She has occasional white sputum production.  Constitutional: No fever, significant weight change, fatigue  Eyes: No redness, discharge, pain, vision change ENT/mouth: No nasal congestion, purulent discharge, earache, change in hearing, sore  throat  Cardiovascular: No chest pain, palpitations, paroxysmal nocturnal dyspnea, claudication, edema  Respiratory: No hemoptysis, DOE Gastrointestinal: No heartburn, dysphagia, abdominal pain, nausea /vomiting, rectal bleeding, melena, change in bowels Genitourinary: No dysuria,  hematuria, pyuria, incontinence, nocturia Musculoskeletal: No joint stiffness, joint swelling, weakness  Dermatologic: No rash, pruritus, change in appearance of skin other than the wound Neurologic: No dizziness, headache, syncope, seizures, numbness, tingling Psychiatric: No significant anxiety, depression, insomnia, anorexia Endocrine: No change in hair/skin/nails, excessive thirst, excessive hunger, excessive urination  Hematologic/lymphatic: No significant bruising, lymphadenopathy,new abnormal bleeding Allergy/immunology: No itchy/watery eyes, significant sneezing, urticaria, angioedema  Physical exam:  Pertinent or positive findings: Slight ptosis is suggested.  She has minor rhonchi anteriorly.  She has a grade 1 systolic murmur at the left sternal border.  The left third-fifth toes are missing surgically.  Wound VAC is present.  Pedal pulses are decreased except for the left dorsalis pedis pulse.  She is weak in the left upper and left lower extremity compared to the right.  Clubbing of the nailbeds suggested.  The nails are elongated especially of on the left hand.  General appearance: Adequately nourished; no acute distress, increased work of breathing is present.   Lymphatic: No lymphadenopathy about the head, neck, axilla. Eyes: No conjunctival inflammation or lid edema is present. There is no scleral icterus. Ears:  External ear exam shows no significant lesions or deformities.   Nose:  External nasal examination shows no deformity or inflammation. Nasal mucosa are pink and moist without lesions, exudates Oral exam: Lips and gums are healthy appearing.There is no oropharyngeal erythema or exudate. Neck:  No thyromegaly, masses, tenderness noted.    Heart:  Normal rate and regular rhythm. S1 and S2 normal without gallop, click, rub.  Lungs: without wheezes, rales, rubs. Abdomen: Bowel sounds are normal.  Abdomen is soft and nontender with no organomegaly, hernias, masses. GU:  Deferred  Extremities:  No cyanosis, edema. Neurologic exam:  Balance, Rhomberg, finger to nose testing could not be completed due to clinical state Skin: Warm & dry w/o tenting. No significant rash.  See clinical summary under each active problem in the Problem List with associated updated therapeutic plan

## 2018-06-12 NOTE — Assessment & Plan Note (Signed)
Psych NP F/U @ SNF

## 2018-06-12 NOTE — Assessment & Plan Note (Signed)
Risk discussed with her frankly Rapid weaning & D/C of opiods recommended because of risk

## 2018-06-12 NOTE — Assessment & Plan Note (Signed)
Monitor for recurrent GI bleeding

## 2018-06-13 LAB — CBC AND DIFFERENTIAL
HCT: 25 — AB (ref 36–46)
Hemoglobin: 8 — AB (ref 12.0–16.0)
Neutrophils Absolute: 4
Platelets: 427 — AB (ref 150–399)
WBC: 7.6

## 2018-06-17 ENCOUNTER — Encounter: Payer: Self-pay | Admitting: Internal Medicine

## 2018-06-17 DIAGNOSIS — D509 Iron deficiency anemia, unspecified: Secondary | ICD-10-CM | POA: Insufficient documentation

## 2018-06-17 LAB — CBC AND DIFFERENTIAL
HEMATOCRIT: 26 — AB (ref 36–46)
Hemoglobin: 8.1 — AB (ref 12.0–16.0)
NEUTROS ABS: 4
PLATELETS: 544 — AB (ref 150–399)
WBC: 8.7

## 2018-06-20 ENCOUNTER — Encounter: Payer: Self-pay | Admitting: Infectious Diseases

## 2018-06-20 ENCOUNTER — Ambulatory Visit (INDEPENDENT_AMBULATORY_CARE_PROVIDER_SITE_OTHER): Payer: Medicare Other | Admitting: Infectious Diseases

## 2018-06-20 VITALS — BP 163/109 | HR 101 | Temp 98.0°F

## 2018-06-20 DIAGNOSIS — E118 Type 2 diabetes mellitus with unspecified complications: Secondary | ICD-10-CM | POA: Diagnosis not present

## 2018-06-20 DIAGNOSIS — E1165 Type 2 diabetes mellitus with hyperglycemia: Secondary | ICD-10-CM

## 2018-06-20 DIAGNOSIS — E11621 Type 2 diabetes mellitus with foot ulcer: Secondary | ICD-10-CM | POA: Diagnosis not present

## 2018-06-20 DIAGNOSIS — L97423 Non-pressure chronic ulcer of left heel and midfoot with necrosis of muscle: Secondary | ICD-10-CM

## 2018-06-20 DIAGNOSIS — M86672 Other chronic osteomyelitis, left ankle and foot: Secondary | ICD-10-CM

## 2018-06-20 DIAGNOSIS — IMO0002 Reserved for concepts with insufficient information to code with codable children: Secondary | ICD-10-CM

## 2018-06-20 NOTE — Progress Notes (Signed)
Patient: Diamond Rice  DOB: 03-Mar-1971 MRN: [id number] PCP: [name], MD    Patient Active Problem List   Diagnosis Date Noted  . Microcytic hypochromic anemia 06/17/2018  . At risk for adverse drug reaction 06/11/2018  . Critical lower limb ischemia 05/27/2018  . GI bleeding 04/02/2018  . Acute renal failure (ARF) ([place]) 04/02/2018  . Hypokalemia 04/02/2018  . Polysubstance abuse ([place]) 04/02/2018  . Diabetic foot ulcer ([place]) 04/02/2018  . Cannabis use disorder, moderate, dependence ([place]) 05/19/2017  . Cocaine use disorder, moderate, dependence ([place]) 05/19/2017  . Schizoaffective disorder, bipolar type ([place]) 10/07/2015  . OSA (obstructive sleep apnea) 06/09/2015  . Vitamin D deficiency 01/06/2015  . Bilateral knee pain 01/05/2015  . Numbness and tingling in right hand 01/05/2015  . Insomnia 12/07/2014  . Right-sided lacunar infarction ([place]) 09/15/2014  . Left hemiparesis ([place]) 09/15/2014  . Dyspnea   . Back pain 09/11/2013  . Psychoactive substance-induced organic mood disorder ([place]) 05/28/2013  . Cocaine abuse with cocaine-induced mood disorder ([place]) 05/28/2013  . Cannabis dependence with cannabis-induced anxiety disorder ([place]) 05/28/2013  . Morbid obesity with BMI of 45.0-49.9, adult ([place]) 04/25/2012  . Chest pain 04/17/2012  . Hypertension 04/17/2012  . Diabetes mellitus type 2 with complications, uncontrolled ([place]) 04/17/2012  . Hyperlipidemia 04/17/2012  . Tobacco use 04/17/2012     Subjective:  Diamond Rice is a 47 y.o. female with chronic neuropathic foot ulcer on the left foot with ostomyelitis of the left #3, 4 toes. She is now S/P amputation of #3, #4, #5 toes on the left.   Several surgeries and interventions done recently - 9/30 she had an abdominal aortogram with balloon angioplasty of the left anterior tibial artery.  10/01 underwent amputation of the left #3, 4, 5 toes Bridgett Larsson). At the time of surgery she had an open wound involving all  the tendons down to the bone of the #3 and #4 toes; the #5 toe required amputation for increased chance of wound healing.  10/10 underwent debridement of the left foot and wound vac application. She has had trouble withpost op bleeding at the skin edges that required topical hemostatic agents. On 10/11 the wound vac would not retain a good seal with oozing of the site (blood). She required blood transfusion for this. She is not walking much but when she does is using an orthopedic shoe.   Last saw Dr. Baxter Flattery prior to amputation and was taking Augmentin and Cipro for e. Faecalis and e coli infection of the foot. She has since had the above referenced procedures. Since surgery she had a wound vac up until about a week ago when it began having trouble maintaining a good seal and it was removed. She has some drainage to to the site that is malodorus but overall amount of drainage is improved and dressings are only changed about once a day now. Her wound was changed yesterday. She is asking me how long this is going to take to heal. She has not picked up smoking again since her surgery. Blood sugars constantly over 200. She is essentially insensate. No fevers, chills, night sweats, severe pain, no increased drainage (improved) and no worsening pain. She has not yet had follow up with her surgical team and is not seeing a wound care team regularly.   Review of Systems  Constitutional: Negative for chills, fever, malaise/fatigue and weight loss.  HENT: Negative for sore throat.   Respiratory: Negative for cough and sputum production.  Cardiovascular: Negative for chest pain and leg swelling.  Gastrointestinal: Negative for abdominal pain, diarrhea and vomiting.  Genitourinary: Negative for dysuria and flank pain.  Musculoskeletal: Negative for joint pain, myalgias and neck pain.  Skin: Negative for rash.       Large wound on L foot   Neurological: Negative for dizziness, tingling and headaches.    Psychiatric/Behavioral: Negative for depression and substance abuse. The patient is not nervous/anxious and does not have insomnia.     Past Medical History:  Diagnosis Date  . Anginal pain ([place])   . Anxiety   . Chronic lower back pain 04/17/2012   "just got over some; catched when I walked"  . COPD (chronic obstructive pulmonary disease) ([place])   . Critical lower limb ischemia 05/27/2018  . Depression   . Headache(784.0) 04/17/2012   "~ qod; lately waking up in am w/one"  . Heart attack ([place])   . High cholesterol   . Hypertension   . Migraines 04/17/2012  . Obesity   . Shortness of breath 04/17/2012   "all the time"  . Sleep apnea    scheduled to get her CPAP machine this week  . Stroke ([place])   . Type II diabetes mellitus ([place]) 08/28/2002    Outpatient Medications Prior to Visit  Medication Sig Dispense Refill  . ARIPiprazole (ABILIFY) 15 MG tablet Take 1 tablet (15 mg total) by mouth at bedtime. 30 tablet 0  . aspirin EC 81 MG EC tablet Take 1 tablet (81 mg total) by mouth daily.    . cloNIDine (CATAPRES) 0.3 MG tablet Take 1 tablet (0.3 mg total) by mouth 3 (three) times daily. 90 tablet 0  . clopidogrel (PLAVIX) 75 MG tablet Take 1 tablet (75 mg total) by mouth daily. 30 tablet 3  . furosemide (LASIX) 40 MG tablet Take 40 mg by mouth daily.    Marland Kitchen gabapentin (NEURONTIN) 100 MG capsule Take 100 mg by mouth 3 (three) times daily.    . hydrALAZINE (APRESOLINE) 50 MG tablet Take 1 tablet (50 mg total) by mouth every 8 (eight) hours. 90 tablet 0  . Insulin Detemir (LEVEMIR) 100 UNIT/ML Pen Inject 15 Units into the skin 2 (two) times daily.     Marland Kitchen lisinopril (PRINIVIL,ZESTRIL) 20 MG tablet Take 20 mg by mouth daily.    Marland Kitchen oxyCODONE (OXY IR/ROXICODONE) 5 MG immediate release tablet Take 1-2 tablets (5-10 mg total) by mouth every 4 (four) hours as needed for moderate pain. 30 tablet 0  . pentoxifylline (TRENTAL) 400 MG CR tablet Take 400 mg by mouth 3 (three) times daily with meals.     .  potassium chloride (K-DUR) 10 MEQ tablet Take 2 tablets (20 mEq total) by mouth daily. 14 tablet 0   No facility-administered medications prior to visit.      Allergies  Allergen Reactions  . Morphine And Related Hives  . Metformin Diarrhea    Social History   Tobacco Use  . Smoking status: Current Every Day Smoker    Packs/day: 0.25    Years: 28.00    Pack years: 7.00    Types: Cigarettes  . Smokeless tobacco: Never Used  Substance Use Topics  . Alcohol use: No    Alcohol/week: 0.0 standard drinks    Comment: rare  . Drug use: Yes    Types: Marijuana, "Crack" cocaine    Comment: marijuana last use days ago; last cocaine several months ago    Family History  Problem Relation Age of Onset  .  Stroke Brother 62  . Stroke Brother 34  . Heart attack Mother 61  . Stroke Father 60    Objective:   Vitals:   06/20/18 1104  BP: (!) 163/109  Pulse: (!) 101  Temp: 98 F (36.7 C)   There is no height or weight on file to calculate BMI.  Physical Exam  Constitutional: She is oriented to person, place, and time. She appears well-developed and well-nourished.  Seated in w/c today. Appears comfortable.   HENT:  Mouth/Throat: Oropharynx is clear and moist. No oral lesions. No dental abscesses.  Eyes: Pupils are equal, round, and reactive to light. No scleral icterus.  Cardiovascular: Normal rate, regular rhythm and normal heart sounds.  Pulmonary/Chest: Effort normal and breath sounds normal.  Abdominal: Soft. She exhibits no distension. There is no tenderness.  Musculoskeletal: Normal range of motion. She exhibits no tenderness.  Lymphadenopathy:    She has no cervical adenopathy.  Neurological: She is alert and oriented to person, place, and time.  Skin: Skin is warm and dry. No rash noted.  Psychiatric: Judgment normal.  Flat    Left Foot: Large wound in place. Periwound is without erythema, induration, warmth or pain. There is malodor present and white/tan and  bloody drainage on the hydrofera blue foam today. Wound bed does not demonstrate much granulation and appears a bit pale. Good palpable pulses and foot is warm. She has about 40% adherent slough at the base of the wound near plantar margin.      Lab Results: Lab Results  Component Value Date   WBC 9.3 06/20/2018   HGB 8.6 (L) 06/20/2018   HCT 28.3 (L) 06/20/2018   MCV 72.4 (L) 06/20/2018   PLT 659 (H) 06/20/2018    Lab Results  Component Value Date   CREATININE 1.25 (H) 06/09/2018   BUN 12 06/09/2018   NA 137 06/09/2018   K 4.2 06/09/2018   CL 104 06/09/2018   CO2 26 06/09/2018    Lab Results  Component Value Date   ALT 9 05/22/2018   AST 13 (L) 05/22/2018   ALKPHOS 51 05/22/2018   BILITOT 0.7 05/22/2018     Assessment & Plan:   Problem List Items Addressed This Visit      Unprioritized   Diabetes mellitus type 2 with complications, uncontrolled ([place]) (Chronic)    Ongoing poor control of diabetes with random blood sugars > 250 consistently. I discussed with her today that this is a huge barrier to her healing.        Diabetic foot ulcer (H[facility]) (Chronic)    Large non-healing wound following amputation of the last 3 toes on the left foot d/t osteomyelitis that failed conservative therapy. She is now s/p revascularization and easily palpated pedal pulse; although wound bed appears pale. She is not acutely ill today. Will hold on continuing any antibiotics for now as I think she may require more debridement. Would consider deep biopsy of tissue following debridement if it is still felt that infection is contributing. Op notes reviewed and seems like good margins achieved with amputation. She is at high risk for TM amputation I think. Check CRP ESR CBC today. I will see if she can get an earlier appointment with Dr. Donzetta Matters for earlier assessment and recommendations. I think she needs to go to a wound clinic to help this heal.   Return in about 4 weeks (around 07/18/2018).         Other Visit Diagnoses    Chronic osteomyelitis  of left foot ([place])    -  Primary   Relevant Orders   CBC (Completed)   C-reactive protein   Sedimentation rate (Completed)      Janene Madeira, MSN, NP-C [facility] [place] Pager: [phone] Office: [phone]  06/21/18  12:30 AM

## 2018-06-20 NOTE — Patient Instructions (Signed)
Would see if we can get earlier follow up with Dr. Vella Redhead next week.   May need further surgery to debride site. Likely plastics to close.   No antibiotics for now.   Check labs today.   Please return in 3-4 weeks to see Dr. Baxter Flattery

## 2018-06-21 ENCOUNTER — Ambulatory Visit (INDEPENDENT_AMBULATORY_CARE_PROVIDER_SITE_OTHER): Payer: Self-pay | Admitting: Vascular Surgery

## 2018-06-21 ENCOUNTER — Encounter: Payer: Self-pay | Admitting: Vascular Surgery

## 2018-06-21 VITALS — BP 175/112 | HR 93 | Temp 97.8°F | Resp 16 | Ht 61.0 in | Wt 188.0 lb

## 2018-06-21 DIAGNOSIS — T8189XD Other complications of procedures, not elsewhere classified, subsequent encounter: Secondary | ICD-10-CM

## 2018-06-21 DIAGNOSIS — I739 Peripheral vascular disease, unspecified: Secondary | ICD-10-CM

## 2018-06-21 LAB — CBC
HEMATOCRIT: 28.3 % — AB (ref 35.0–45.0)
Hemoglobin: 8.6 g/dL — ABNORMAL LOW (ref 11.7–15.5)
MCH: 22 pg — AB (ref 27.0–33.0)
MCHC: 30.4 g/dL — AB (ref 32.0–36.0)
MCV: 72.4 fL — AB (ref 80.0–100.0)
MPV: 10.4 fL (ref 7.5–12.5)
Platelets: 659 10*3/uL — ABNORMAL HIGH (ref 140–400)
RBC: 3.91 10*6/uL (ref 3.80–5.10)
RDW: 16.6 % — AB (ref 11.0–15.0)
WBC: 9.3 10*3/uL (ref 3.8–10.8)

## 2018-06-21 LAB — SEDIMENTATION RATE: Sed Rate: 74 mm/h — ABNORMAL HIGH (ref 0–20)

## 2018-06-21 LAB — C-REACTIVE PROTEIN: CRP: 5.2 mg/L (ref <8.0)

## 2018-06-21 NOTE — Assessment & Plan Note (Signed)
Large non-healing wound following amputation of the last 3 toes on the left foot d/t osteomyelitis that failed conservative therapy. She is now s/p revascularization and easily palpated pedal pulse; although wound bed appears pale. She is not acutely ill today. Will hold on continuing any antibiotics for now as I think she may require more debridement. Would consider deep biopsy of tissue following debridement if it is still felt that infection is contributing. Op notes reviewed and seems like good margins achieved with amputation. She is at high risk for TM amputation I think. Check CRP ESR CBC today. I will see if she can get an earlier appointment with Dr. Cain for earlier assessment and recommendations. I think she needs to go to a wound clinic to help this heal.   Return in about 4 weeks (around 07/18/2018).  

## 2018-06-21 NOTE — Assessment & Plan Note (Signed)
Ongoing poor control of diabetes with random blood sugars > 250 consistently. I discussed with her today that this is a huge barrier to her healing.

## 2018-06-21 NOTE — Progress Notes (Signed)
    Subjective:     Patient ID: Diamond Rice, female   DOB: 02/20/1971, 47 y.o.   MRN: [id number]  HPI 47 year old female follows up for evaluation of left foot wound.  She is recently undergone left lower extremity revascularization amputation of toes 3 through 5.  She now has a wound.  States they are changing it every day or so.  She has not had any fevers or chills.   Review of Systems Left foot wound    Objective:   Physical Exam Awake alert oriented Palpable left dorsalis pedis pulse There is some fibrinous exudate at the wound with some foul smell.  After debriding there is good blood flow. I have cleaned up the wound with no further foul smell.       Assessment:     47 year old female status post imitation left toes 3 through 5 and revascularization.  She has a palpable pulse with great blood flow to her foot and wound.  Wound was debrided in the office today back to healthy appearing tissue although the medial aspect still has some concern.    Plan:     Follow-up in 1 week in the office for wound check.  If there is further concern for wound issues she can be booked for me to debride the wound versus formal transmetatarsal amputation in the operating room the following week.  Brandon C. Donzetta Matters, MD [facility] [place] Office: [phone] Pager: [phone]

## 2018-06-22 ENCOUNTER — Emergency Department (HOSPITAL_COMMUNITY): Payer: Medicare Other

## 2018-06-22 ENCOUNTER — Inpatient Hospital Stay (HOSPITAL_COMMUNITY)
Admission: EM | Admit: 2018-06-22 | Discharge: 2018-06-25 | DRG: 176 | Disposition: A | Discharge status: Skilled Nursing Facility | Payer: Medicare Other | Attending: Family Medicine | Admitting: Family Medicine

## 2018-06-22 ENCOUNTER — Other Ambulatory Visit: Payer: Self-pay

## 2018-06-22 DIAGNOSIS — K5900 Constipation, unspecified: Secondary | ICD-10-CM | POA: Diagnosis present

## 2018-06-22 DIAGNOSIS — F250 Schizoaffective disorder, bipolar type: Secondary | ICD-10-CM | POA: Diagnosis present

## 2018-06-22 DIAGNOSIS — E7800 Pure hypercholesterolemia, unspecified: Secondary | ICD-10-CM | POA: Diagnosis present

## 2018-06-22 DIAGNOSIS — E1151 Type 2 diabetes mellitus with diabetic peripheral angiopathy without gangrene: Secondary | ICD-10-CM

## 2018-06-22 DIAGNOSIS — Z6835 Body mass index (BMI) 35.0-35.9, adult: Secondary | ICD-10-CM

## 2018-06-22 DIAGNOSIS — I119 Hypertensive heart disease without heart failure: Secondary | ICD-10-CM | POA: Diagnosis present

## 2018-06-22 DIAGNOSIS — Z79899 Other long term (current) drug therapy: Secondary | ICD-10-CM

## 2018-06-22 DIAGNOSIS — Z89422 Acquired absence of other left toe(s): Secondary | ICD-10-CM | POA: Diagnosis not present

## 2018-06-22 DIAGNOSIS — Z89429 Acquired absence of other toe(s), unspecified side: Secondary | ICD-10-CM | POA: Diagnosis not present

## 2018-06-22 DIAGNOSIS — I2693 Single subsegmental pulmonary embolism without acute cor pulmonale: Secondary | ICD-10-CM | POA: Diagnosis present

## 2018-06-22 DIAGNOSIS — Z89432 Acquired absence of left foot: Secondary | ICD-10-CM

## 2018-06-22 DIAGNOSIS — Z86010 Personal history of colonic polyps: Secondary | ICD-10-CM | POA: Diagnosis not present

## 2018-06-22 DIAGNOSIS — Z6841 Body Mass Index (BMI) 40.0 and over, adult: Secondary | ICD-10-CM

## 2018-06-22 DIAGNOSIS — F329 Major depressive disorder, single episode, unspecified: Secondary | ICD-10-CM | POA: Diagnosis present

## 2018-06-22 DIAGNOSIS — E785 Hyperlipidemia, unspecified: Secondary | ICD-10-CM | POA: Diagnosis present

## 2018-06-22 DIAGNOSIS — G4733 Obstructive sleep apnea (adult) (pediatric): Secondary | ICD-10-CM | POA: Diagnosis present

## 2018-06-22 DIAGNOSIS — J440 Chronic obstructive pulmonary disease with acute lower respiratory infection: Secondary | ICD-10-CM | POA: Diagnosis not present

## 2018-06-22 DIAGNOSIS — K229 Disease of esophagus, unspecified: Secondary | ICD-10-CM | POA: Diagnosis not present

## 2018-06-22 DIAGNOSIS — Z888 Allergy status to other drugs, medicaments and biological substances status: Secondary | ICD-10-CM | POA: Diagnosis not present

## 2018-06-22 DIAGNOSIS — E1159 Type 2 diabetes mellitus with other circulatory complications: Secondary | ICD-10-CM | POA: Diagnosis not present

## 2018-06-22 DIAGNOSIS — M545 Low back pain: Secondary | ICD-10-CM | POA: Diagnosis present

## 2018-06-22 DIAGNOSIS — G8929 Other chronic pain: Secondary | ICD-10-CM | POA: Diagnosis present

## 2018-06-22 DIAGNOSIS — I2699 Other pulmonary embolism without acute cor pulmonale: Secondary | ICD-10-CM | POA: Diagnosis present

## 2018-06-22 DIAGNOSIS — G4700 Insomnia, unspecified: Secondary | ICD-10-CM | POA: Diagnosis present

## 2018-06-22 DIAGNOSIS — I517 Cardiomegaly: Secondary | ICD-10-CM

## 2018-06-22 DIAGNOSIS — R112 Nausea with vomiting, unspecified: Secondary | ICD-10-CM | POA: Diagnosis present

## 2018-06-22 DIAGNOSIS — Z8739 Personal history of other diseases of the musculoskeletal system and connective tissue: Secondary | ICD-10-CM

## 2018-06-22 DIAGNOSIS — I739 Peripheral vascular disease, unspecified: Secondary | ICD-10-CM

## 2018-06-22 DIAGNOSIS — Z87891 Personal history of nicotine dependence: Secondary | ICD-10-CM | POA: Diagnosis not present

## 2018-06-22 DIAGNOSIS — Z823 Family history of stroke: Secondary | ICD-10-CM

## 2018-06-22 DIAGNOSIS — D509 Iron deficiency anemia, unspecified: Secondary | ICD-10-CM | POA: Diagnosis present

## 2018-06-22 DIAGNOSIS — E66812 Obesity, class 2: Secondary | ICD-10-CM

## 2018-06-22 DIAGNOSIS — Z794 Long term (current) use of insulin: Secondary | ICD-10-CM

## 2018-06-22 DIAGNOSIS — Z8249 Family history of ischemic heart disease and other diseases of the circulatory system: Secondary | ICD-10-CM | POA: Diagnosis not present

## 2018-06-22 DIAGNOSIS — I69354 Hemiplegia and hemiparesis following cerebral infarction affecting left non-dominant side: Secondary | ICD-10-CM

## 2018-06-22 DIAGNOSIS — Z7982 Long term (current) use of aspirin: Secondary | ICD-10-CM

## 2018-06-22 DIAGNOSIS — Z7902 Long term (current) use of antithrombotics/antiplatelets: Secondary | ICD-10-CM

## 2018-06-22 DIAGNOSIS — W44F3XA Food entering into or through a natural orifice, initial encounter: Secondary | ICD-10-CM

## 2018-06-22 DIAGNOSIS — Z79891 Long term (current) use of opiate analgesic: Secondary | ICD-10-CM

## 2018-06-22 DIAGNOSIS — I351 Nonrheumatic aortic (valve) insufficiency: Secondary | ICD-10-CM | POA: Diagnosis not present

## 2018-06-22 LAB — COMPREHENSIVE METABOLIC PANEL WITH GFR
ALT: 9 U/L (ref 0–44)
AST: 14 U/L — ABNORMAL LOW (ref 15–41)
Albumin: 3.3 g/dL — ABNORMAL LOW (ref 3.5–5.0)
Alkaline Phosphatase: 72 U/L (ref 38–126)
Anion gap: 9 (ref 5–15)
BUN: 7 mg/dL (ref 6–20)
CO2: 25 mmol/L (ref 22–32)
Calcium: 9.4 mg/dL (ref 8.9–10.3)
Chloride: 103 mmol/L (ref 98–111)
Creatinine, Ser: 1.18 mg/dL — ABNORMAL HIGH (ref 0.44–1.00)
GFR calc Af Amer: >60 mL/min
GFR calc non Af Amer: 54 mL/min — ABNORMAL LOW
Glucose, Bld: 195 mg/dL — ABNORMAL HIGH (ref 70–99)
Potassium: 3.4 mmol/L — ABNORMAL LOW (ref 3.5–5.1)
Sodium: 137 mmol/L (ref 135–145)
Total Bilirubin: 0.7 mg/dL (ref 0.3–1.2)
Total Protein: 7.5 g/dL (ref 6.5–8.1)

## 2018-06-22 LAB — CBC WITH DIFFERENTIAL/PLATELET
Abs Immature Granulocytes: 0.08 10*3/uL — ABNORMAL HIGH (ref 0.00–0.07)
BASOS PCT: 1 %
Basophils Absolute: 0.1 10*3/uL (ref 0.0–0.1)
EOS ABS: 0.7 10*3/uL — AB (ref 0.0–0.5)
Eosinophils Relative: 10 %
HCT: 30.2 % — ABNORMAL LOW (ref 36.0–46.0)
Hemoglobin: 9 g/dL — ABNORMAL LOW (ref 12.0–15.0)
IMMATURE GRANULOCYTES: 1 %
LYMPHS ABS: 1.7 10*3/uL (ref 0.7–4.0)
Lymphocytes Relative: 25 %
MCH: 22 pg — ABNORMAL LOW (ref 26.0–34.0)
MCHC: 29.8 g/dL — ABNORMAL LOW (ref 30.0–36.0)
MCV: 73.7 fL — ABNORMAL LOW (ref 80.0–100.0)
Monocytes Absolute: 0.6 10*3/uL (ref 0.1–1.0)
Monocytes Relative: 8 %
NRBC: 0 % (ref 0.0–0.2)
Neutro Abs: 3.7 10*3/uL (ref 1.7–7.7)
Neutrophils Relative %: 55 %
PLATELETS: 635 10*3/uL — AB (ref 150–400)
RBC: 4.1 MIL/uL (ref 3.87–5.11)
RDW: 17.3 % — AB (ref 11.5–15.5)
WBC: 6.9 10*3/uL (ref 4.0–10.5)

## 2018-06-22 LAB — LIPASE, BLOOD: Lipase: 34 U/L (ref 11–51)

## 2018-06-22 LAB — RAPID URINE DRUG SCREEN, HOSP PERFORMED
Amphetamines: NOT DETECTED
Barbiturates: NOT DETECTED
Benzodiazepines: NOT DETECTED
Cocaine: NOT DETECTED
Opiates: NOT DETECTED
Tetrahydrocannabinol: POSITIVE — AB

## 2018-06-22 LAB — I-STAT CHEM 8, ED
BUN: 7 mg/dL (ref 6–20)
Calcium, Ion: 1.19 mmol/L (ref 1.15–1.40)
Chloride: 101 mmol/L (ref 98–111)
Creatinine, Ser: 1.1 mg/dL — ABNORMAL HIGH (ref 0.44–1.00)
Glucose, Bld: 192 mg/dL — ABNORMAL HIGH (ref 70–99)
HCT: 30 % — ABNORMAL LOW (ref 36.0–46.0)
Hemoglobin: 10.2 g/dL — ABNORMAL LOW (ref 12.0–15.0)
Potassium: 3.5 mmol/L (ref 3.5–5.1)
Sodium: 138 mmol/L (ref 135–145)
TCO2: 25 mmol/L (ref 22–32)

## 2018-06-22 LAB — HEMOGLOBIN A1C
Hgb A1c MFr Bld: 6.8 % — ABNORMAL HIGH (ref 4.8–5.6)
Mean Plasma Glucose: 148.46 mg/dL

## 2018-06-22 LAB — URINALYSIS, ROUTINE W REFLEX MICROSCOPIC
Bacteria, UA: NONE SEEN
Bilirubin Urine: NEGATIVE
Glucose, UA: 50 mg/dL — AB
Hgb urine dipstick: NEGATIVE
Ketones, ur: 5 mg/dL — AB
Leukocytes, UA: NEGATIVE
Nitrite: NEGATIVE
Protein, ur: 30 mg/dL — AB
Specific Gravity, Urine: 1.016 (ref 1.005–1.030)
pH: 6 (ref 5.0–8.0)

## 2018-06-22 LAB — BRAIN NATRIURETIC PEPTIDE: B Natriuretic Peptide: 96.6 pg/mL (ref 0.0–100.0)

## 2018-06-22 LAB — GLUCOSE, CAPILLARY: GLUCOSE-CAPILLARY: 209 mg/dL — AB (ref 70–99)

## 2018-06-22 LAB — I-STAT TROPONIN, ED: Troponin i, poc: 0 ng/mL (ref 0.00–0.08)

## 2018-06-22 LAB — PROTIME-INR
INR: 1.08
Prothrombin Time: 13.9 s (ref 11.4–15.2)

## 2018-06-22 LAB — D-DIMER, QUANTITATIVE: D-Dimer, Quant: 4.31 ug{FEU}/mL — ABNORMAL HIGH (ref 0.00–0.50)

## 2018-06-22 LAB — I-STAT CG4 LACTIC ACID, ED: LACTIC ACID, VENOUS: 1.27 mmol/L (ref 0.5–1.9)

## 2018-06-22 MED ORDER — HEPARIN BOLUS VIA INFUSION
4000.0000 [IU] | Freq: Once | INTRAVENOUS | Status: AC
  Administered 2018-06-22: 4000 [IU] via INTRAVENOUS
  Filled 2018-06-22: qty 4000

## 2018-06-22 MED ORDER — PENTOXIFYLLINE ER 400 MG PO TBCR: 400.0000 mg | EXTENDED_RELEASE_TABLET | Freq: Three times a day (TID) | ORAL | Status: DC

## 2018-06-22 MED ORDER — RIVAROXABAN 15 MG PO TABS: 15.0000 mg | ORAL_TABLET | Freq: Two times a day (BID) | ORAL | Status: DC

## 2018-06-22 MED ORDER — ARIPIPRAZOLE 5 MG PO TABS
15.0000 mg | ORAL_TABLET | Freq: Every day | ORAL | Status: DC
  Administered 2018-06-22 – 2018-06-24 (×3): 15 mg via ORAL
  Filled 2018-06-22 (×4): qty 1

## 2018-06-22 MED ORDER — IOPAMIDOL (ISOVUE-370) INJECTION 76%
100.0000 mL | Freq: Once | INTRAVENOUS | Status: AC | PRN
  Administered 2018-06-22: 100 mL via INTRAVENOUS

## 2018-06-22 MED ORDER — OXYCODONE HCL 5 MG PO TABS
5.0000 mg | ORAL_TABLET | ORAL | Status: DC | PRN
  Administered 2018-06-23: 5 mg via ORAL
  Filled 2018-06-22: qty 1

## 2018-06-22 MED ORDER — INFLUENZA VAC SPLIT QUAD 0.5 ML IM SUSY: 0.5000 mL | PREFILLED_SYRINGE | INTRAMUSCULAR | Status: DC | PRN

## 2018-06-22 MED ORDER — ACETAMINOPHEN 650 MG RE SUPP: 650.0000 mg | Freq: Four times a day (QID) | RECTAL | Status: DC | PRN

## 2018-06-22 MED ORDER — ASPIRIN EC 81 MG PO TBEC
81.0000 mg | DELAYED_RELEASE_TABLET | Freq: Every day | ORAL | Status: DC
  Administered 2018-06-22 – 2018-06-25 (×4): 81 mg via ORAL
  Filled 2018-06-22 (×4): qty 1

## 2018-06-22 MED ORDER — GABAPENTIN 100 MG PO CAPS
100.0000 mg | ORAL_CAPSULE | Freq: Three times a day (TID) | ORAL | Status: DC
  Administered 2018-06-22 – 2018-06-25 (×9): 100 mg via ORAL
  Filled 2018-06-22 (×9): qty 1

## 2018-06-22 MED ORDER — INSULIN ASPART 100 UNIT/ML ~~LOC~~ SOLN
0.0000 [IU] | Freq: Three times a day (TID) | SUBCUTANEOUS | Status: DC
  Administered 2018-06-23: 2 [IU] via SUBCUTANEOUS
  Administered 2018-06-23: 1 [IU] via SUBCUTANEOUS

## 2018-06-22 MED ORDER — RIVAROXABAN 15 MG PO TABS
15.0000 mg | ORAL_TABLET | Freq: Two times a day (BID) | ORAL | Status: DC
  Administered 2018-06-22 – 2018-06-25 (×7): 15 mg via ORAL
  Filled 2018-06-22 (×7): qty 1

## 2018-06-22 MED ORDER — HYDRALAZINE HCL 25 MG PO TABS
50.0000 mg | ORAL_TABLET | Freq: Three times a day (TID) | ORAL | Status: DC
  Administered 2018-06-22 – 2018-06-25 (×9): 50 mg via ORAL
  Filled 2018-06-22 (×9): qty 2

## 2018-06-22 MED ORDER — HEPARIN (PORCINE) IN NACL 100-0.45 UNIT/ML-% IJ SOLN
1050.0000 [IU]/h | INTRAMUSCULAR | Status: DC
  Administered 2018-06-22: 1050 [IU]/h via INTRAVENOUS
  Filled 2018-06-22: qty 250

## 2018-06-22 MED ORDER — INSULIN ASPART 100 UNIT/ML ~~LOC~~ SOLN: 0.0000 [IU] | Freq: Three times a day (TID) | SUBCUTANEOUS | Status: DC

## 2018-06-22 MED ORDER — SODIUM CHLORIDE 0.9% FLUSH
3.0000 mL | Freq: Two times a day (BID) | INTRAVENOUS | Status: DC
  Administered 2018-06-22 – 2018-06-25 (×6): 3 mL via INTRAVENOUS

## 2018-06-22 MED ORDER — INSULIN DETEMIR 100 UNIT/ML ~~LOC~~ SOLN
8.0000 [IU] | Freq: Two times a day (BID) | SUBCUTANEOUS | Status: DC
  Administered 2018-06-22 – 2018-06-25 (×6): 8 [IU] via SUBCUTANEOUS
  Filled 2018-06-22 (×7): qty 0.08

## 2018-06-22 MED ORDER — CLONIDINE HCL 0.2 MG PO TABS
0.3000 mg | ORAL_TABLET | Freq: Three times a day (TID) | ORAL | Status: DC
  Administered 2018-06-22 – 2018-06-25 (×9): 0.3 mg via ORAL
  Filled 2018-06-22 (×9): qty 1

## 2018-06-22 MED ORDER — ACETAMINOPHEN 325 MG PO TABS: 650.0000 mg | ORAL_TABLET | Freq: Four times a day (QID) | ORAL | Status: DC | PRN

## 2018-06-22 NOTE — Progress Notes (Signed)
Order for CPAP received, machine set up CPAP 4cmH20 (per what pt states is her home reg) with [facility]. Pt to notify for RT when she is ready to be placed on CPAP for the night.

## 2018-06-22 NOTE — ED Triage Notes (Signed)
Pt to ER BIB [facility] from [facility] for evaluation of HTN, chest pain, and shortness of breath onset this morning. Pt hx significant for CVA, DM, and CAD. Patient on numerous BP medications and has not missed any. Pt recently had left toe amputations 2 weeks ago.

## 2018-06-22 NOTE — H&P (Addendum)
[facility] Admission History and Physical Service Pager: [phone]  [name] Medical record number: [id number] Date of birth: Jun 03, 1971 Age: 47 y.o. Gender: female  Primary Care Provider: [name], MD Consultants: none Code Status: full  Chief Complaint: SOB and chest pressure  Assessment and Plan: [facility] is a 47 y.o. female presenting with PE . PMH is significant for T2DM, COPD, MI, CVA, mood disorder with residual left-sided weakness.  Shortness of Breath, Chest Pain- Patient admitted from [facility] for acute onset SOB with chest pain, N/V today.She feels short of breath with difficulty speaking in full sentences. She denies chest pain at time of admission She also denies any leg swelling. She has no history of previous blood clots or family history. She has been staying at nursing home since toe amputation on 10/01 with only physical activity being with PT- otherwise does not walk. Patient satting ~100% on room air. Tachycardic 110. Tachypneic to 27. Chest xray showed streaky bibasilar atelectasis no infiltrates or effusion. CTA showed right lower lobe pulmonary embolus. No evidence for heart strain. RV/LV ratio 0.9, which is low risk for decompensation. PE severity index score 76 for tachycardia + COPD, class II 1.7-3.5% risk for 30d mortality. EKG shows tachycardia but not S1Q3T3. Initial troponins neg. D-dimer 4.31 Lung exam no adventitious sounds. Home meds ASA 81mg  and plavix 75mg  daily,  trental 100mg  TID. Risk factors for the PE are  recent surgery, will consider this provoked with intention to treat for 12m. No indication for hypercoagulable workup in this setting. - admit to telemetry, attending Dr. Owens Shark - discontinue heparin drip from ED - continue aspirin  - add xarelto  - continuous cardiac monitoring - continuous pulse ox - CBC am - hold home plavix and trental  R4YH with complication- patient states she  takes 15 levomir in morning and at night. She takes gabapentin 100mg  TID. patient had 3-5 digit on left foot amputated 10/01. On exam, wound is malodorous with granulation tissue in base. Approximately 5x4cm diameter. Patient is afebrile with WBC 6.9. She does not have significant foot pain. - A1c am - 8 units lantus BID - continue gabapentin - CBGs - moderate SSI - carb modified diet  HTN- Patient is hypertensive on admission BP max 190/85 and 172/110. Patient denies any headache in ED. She also denies any vision changes. Patient takes clonidine 0.3mg  TID, lasix 40mg  daily, lisinopril 30mg  daily, hydralazine 50mg  TID. Diagnosis of OSA on problem list, CPAP Compliance may play a role in HTN.  - ask patient in am about CPAP and use - continue home meds - holding lisinopril given dye load with CTA - monitor vitals q4h - consider lasix in am, reassess volume status  H/o CVA- 2016 on plavix 75mg  daily and asa 81 home meds. - continue home ASA - hold plavix   Microcytic Anemia- Hgb 9.0 today appears to be increased from baseline in ~8.3 in recent past. Patient takes iron supplement at home. Will continue to monitor -CBC am  Constipation- patient takes miralax, senokot at home. Likely exacerbated by patient's other home meds of oxycodone and iron - continue miralax and senokot  FEN/GI: heart healthy, carb modified, received NS bolus in ED Prophylaxis: heparin drip in ED  Disposition: admit to telemetry- discharge with anticoagulation  History of Present Illness:  [facility] is a 47 y.o. female presenting with PE.  Patient was otherwise well at [facility] home recovering from 3 toe amputation  on left foot 10/01. She had debridement most recently by vascular in office 10/25.This morning, she felt nauseous and vomited about 4 times. She also had acute onset chest pressure and felt short of breath. CP is left sided, nonradiating. She cannot identify any alleviating or aggravating  factors. N/V is new for her, she denies hx of gastroparesis or slow emptying. No associated abdominal pain, dysuria, or changes in BMs. Does feel heartbeat is fast. She did not pass out or lose consciousness. Patient states that she is not very active and the only time she walks is with PT at the facility. She denies any family involvement in her care and states emergency contact is Godmother, Baker Janus. She says she quit smoking 1 month ago and denies recent alcohol or drug use. Denies hx of clot, leg swelling, or fam hx of blood clots. Recalls getting lovenox while admitted for amputation, but thinks she is just on ASA and plavix at SNF.  In the ED, she does not feel nauseous or have chest pain. She has not vomited again. She does endorse continued SOB that is not worse with deep breath but does prevent her from speaking in full sentence. She is also hypertensive and tachycardic BP max 190/85 and 172/110. HR max 110. She is afebrile with WBC 6.9 CTA chest showed right lower lobe pulmonary embolus. No evidence for heart strain.  Review Of Systems: Per HPI with the following additions:  Review of Systems  Constitutional: Negative for fever and weight loss.  HENT: Negative for congestion.   Eyes: Negative for blurred vision.  Respiratory: Positive for shortness of breath. Negative for cough.   Cardiovascular: Positive for chest pain and claudication. Negative for palpitations and leg swelling.  Gastrointestinal: Positive for nausea and vomiting. Negative for abdominal pain.  Genitourinary: Negative for dysuria.  Neurological: Negative for dizziness, focal weakness and headaches.  Psychiatric/Behavioral: Negative for memory loss and substance abuse.    Patient Active Problem List   Diagnosis Date Noted  . Microcytic hypochromic anemia 06/17/2018  . At risk for adverse drug reaction 06/11/2018  . Critical lower limb ischemia 05/27/2018  . GI bleeding 04/02/2018  . Acute renal failure (ARF) ([place])  04/02/2018  . Hypokalemia 04/02/2018  . Polysubstance abuse ([place]) 04/02/2018  . Diabetic foot ulcer ([place]) 04/02/2018  . Cannabis use disorder, moderate, dependence ([place]) 05/19/2017  . Cocaine use disorder, moderate, dependence ([place]) 05/19/2017  . Schizoaffective disorder, bipolar type ([place]) 10/07/2015  . OSA (obstructive sleep apnea) 06/09/2015  . Vitamin D deficiency 01/06/2015  . Bilateral knee pain 01/05/2015  . Numbness and tingling in right hand 01/05/2015  . Insomnia 12/07/2014  . Right-sided lacunar infarction ([place]) 09/15/2014  . Left hemiparesis ([place]) 09/15/2014  . Dyspnea   . Back pain 09/11/2013  . Psychoactive substance-induced organic mood disorder ([place]) 05/28/2013  . Cocaine abuse with cocaine-induced mood disorder ([place]) 05/28/2013  . Cannabis dependence with cannabis-induced anxiety disorder ([place]) 05/28/2013  . Morbid obesity with BMI of 45.0-49.9, adult ([place]) 04/25/2012  . Chest pain 04/17/2012  . Hypertension 04/17/2012  . Diabetes mellitus type 2 with complications, uncontrolled ([place]) 04/17/2012  . Hyperlipidemia 04/17/2012  . Tobacco use 04/17/2012    Past Medical History: Past Medical History:  Diagnosis Date  . Anginal pain ([place])   . Anxiety   . Chronic lower back pain 04/17/2012   "just got over some; catched when I walked"  . COPD (chronic obstructive pulmonary disease) ([place])   . Critical lower limb ischemia 05/27/2018  .  Depression   . Headache(784.0) 04/17/2012   "~ qod; lately waking up in am w/one"  . Heart attack ([place])   . High cholesterol   . Hypertension   . Migraines 04/17/2012  . Obesity   . Shortness of breath 04/17/2012   "all the time"  . Sleep apnea    scheduled to get her CPAP machine this week  . Stroke ([place])   . Type II diabetes mellitus ([place]) 08/28/2002    Past Surgical History: Past Surgical History:  Procedure Laterality Date  . ABDOMINAL AORTOGRAM W/LOWER EXTREMITY Left 05/27/2018   Procedure: ABDOMINAL AORTOGRAM W/LOWER  EXTREMITY Runoff and Possible Intervention;  Surgeon: Waynetta Sandy, MD;  Location: [facility];  Service: Cardiovascular;  Laterality: Left;  . APPLICATION OF WOUND VAC Left 06/06/2018   Procedure: APPLICATION OF WOUND VAC;  Surgeon: Waynetta Sandy, MD;  Location: [facility];  Service: Vascular;  Laterality: Left;  . BIOPSY  04/04/2018   Procedure: BIOPSY;  Surgeon: [name], MD;  Location: [facility] ENDOSCOPY;  Service: Endoscopy;;  . CARDIAC CATHETERIZATION  ~ 2011  . COLONOSCOPY N/A 04/04/2018   Procedure: COLONOSCOPY;  Surgeon: [name], MD;  Location: [facility] ENDOSCOPY;  Service: Endoscopy;  Laterality: N/A;  . LOWER EXTREMITY ANGIOGRAM Left 05/27/2018  . PERIPHERAL VASCULAR INTERVENTION Left 05/27/2018   Procedure: PERIPHERAL VASCULAR INTERVENTION;  Surgeon: Waynetta Sandy, MD;  Location: [employer] [facility];  Service: Cardiovascular;  Laterality: Left;  . POLYPECTOMY  04/04/2018   Procedure: POLYPECTOMY;  Surgeon: [name], MD;  Location[facility] ENDOSCOPY;  Service: Endoscopy;;  . TRANSMETATARSAL AMPUTATION Left 05/28/2018   Procedure: AMPUTATION TOES THREE, FOUR AND FIVE on left foot;  Surgeon: Waynetta Sandy, MD;  Location: [facility];  Service: Vascular;  Laterality: Left;  . WOUND DEBRIDEMENT Left 06/06/2018   Procedure: DEBRIDEMENT WOUND LEFT FOOT;  Surgeon: Waynetta Sandy, MD;  Location: [facility] OR;  Service: Vascular;  Laterality: Left;    Social History: Social History   Tobacco Use  . Smoking status: Former Smoker    Packs/day: 0.25    Years: 28.00    Pack years: 7.00    Types: Cigarettes  . Smokeless tobacco: Never Used  Substance Use Topics  . Alcohol use: No    Alcohol/week: 0.0 standard drinks    Comment: rare  . Drug use: Yes    Types: Marijuana, "Crack" cocaine    Comment: marijuana last use days ago; last cocaine several months ago   Additional social history: no spouse or family. Has godmother Baker Janus. Staying at  [facility]. History of homelessness. Has history of drug use. She had quit smoking about 1 month ago. Please also refer to relevant sections of EMR.  Family History: Family History  Problem Relation Age of Onset  . Stroke Brother 30  . Stroke Brother 64  . Heart attack Mother 66  . Stroke Father 9    Allergies and Medications: Allergies  Allergen Reactions  . Morphine And Related Hives  . Metformin Diarrhea   No current facility-administered medications on file prior to encounter.    Current Outpatient Medications on File Prior to Encounter  Medication Sig Dispense Refill  . alum & mag hydroxide-simeth (MAALOX/MYLANTA) 200-200-20 MG/5ML suspension Take 30 mLs by mouth every 2 (two) hours. For 24 hrs    . ARIPiprazole (ABILIFY) 15 MG tablet Take 1 tablet (15 mg total) by mouth at bedtime. 30 tablet 0  . aspirin EC 81 MG EC tablet Take 1 tablet (81  mg total) by mouth daily.    . cloNIDine (CATAPRES) 0.3 MG tablet Take 1 tablet (0.3 mg total) by mouth 3 (three) times daily. 90 tablet 0  . clopidogrel (PLAVIX) 75 MG tablet Take 1 tablet (75 mg total) by mouth daily. 30 tablet 3  . ferrous sulfate 325 (65 FE) MG tablet Take 325 mg by mouth 2 (two) times daily with a meal.    . furosemide (LASIX) 40 MG tablet Take 40 mg by mouth daily.    Marland Kitchen gabapentin (NEURONTIN) 100 MG capsule Take 100 mg by mouth 3 (three) times daily.    . hydrALAZINE (APRESOLINE) 50 MG tablet Take 1 tablet (50 mg total) by mouth every 8 (eight) hours. 90 tablet 0  . Insulin Detemir (LEVEMIR) 100 UNIT/ML Pen Inject 15 Units into the skin 2 (two) times daily.     Marland Kitchen lisinopril (PRINIVIL,ZESTRIL) 20 MG tablet Take 20 mg by mouth daily.    . Melatonin 3 MG TABS Take 3 mg by mouth at bedtime.    Marland Kitchen oxyCODONE (OXY IR/ROXICODONE) 5 MG immediate release tablet Take 1-2 tablets (5-10 mg total) by mouth every 4 (four) hours as needed for moderate pain. 30 tablet 0  . oxyCODONE (ROXICODONE) 15 MG immediate release tablet Take 7.5  mg by mouth every 4 (four) hours as needed for pain.    . pentoxifylline (TRENTAL) 400 MG CR tablet Take 400 mg by mouth 3 (three) times daily with meals.     . polyethylene glycol (MIRALAX / GLYCOLAX) packet Take 17 g by mouth daily.    . potassium chloride (K-DUR) 10 MEQ tablet Take 2 tablets (20 mEq total) by mouth daily. 14 tablet 0  . sennosides-docusate sodium (SENOKOT-S) 8.6-50 MG tablet Take 2 tablets by mouth 2 (two) times daily.      Objective: BP (!) 190/85   Pulse 98   Temp 98.4 F (36.9 C) (Oral)   Resp (!) 27   SpO2 100%  Exam: General: not in acute distress ENTM: MMM, no oral lesions Neck: soft, non-tender Cardiovascular: RRR, 4/6 holosystolic murmer Respiratory: CTAB, mildly diminished lung sounds at bases Gastrointestinal: soft, non-tender, non-distended, active bowel sounds diffusely MSK: 3-5 digit amputation on right foot Derm: ~4x5cm lesion with base of healthy granulation tissue and purulent discharge around edges with foul smell    Neuro: alert and oriented, diffusely weak, no focal feficits Psych: cooperative  Labs and Imaging: CBC BMET  Recent Labs  Lab 06/22/18 1230 06/22/18 1244  WBC 6.9  --   HGB 9.0* 10.2*  HCT 30.2* 30.0*  PLT 635*  --    Recent Labs  Lab 06/22/18 1230 06/22/18 1244  NA 137 138  K 3.4* 3.5  CL 103 101  CO2 25  --   BUN 7 7  CREATININE 1.18* 1.10*  GLUCOSE 195* 192*  CALCIUM 9.4  --      Initial troponin 0.0 D- dimer 4.31 Lipase normal  UDS positive cannabis   Richarda Osmond, DO 06/22/2018, 5:27 PM PGY-1, [place] Intern pager: [phone], text pages welcome  FPTS Upper-Level Resident Addendum   I have independently interviewed and examined the patient. I have discussed the above with the original author and agree with their documentation. My edits for correction/addition/clarification are in blue. Please see also any attending notes.    Ralene Ok, MD PGY-3, [place] Service pager: [phone] (text pages welcome through [facility])

## 2018-06-22 NOTE — Progress Notes (Signed)
ANTICOAGULATION CONSULT NOTE - Initial Consult  Pharmacy Consult for heparin Indication: pulmonary embolus  Allergies  Allergen Reactions  . Morphine And Related Hives  . Metformin Diarrhea    Patient Measurements:   Heparin Dosing Weight: 67kg  Vital Signs: Temp: 98.4 F (36.9 C) (10/26 1138) Temp Source: Oral (10/26 1138) BP: 190/85 (10/26 1400) Pulse Rate: 98 (10/26 1400)  Labs: Recent Labs    06/20/18 1218 06/22/18 1230 06/22/18 1244  HGB 8.6* 9.0* 10.2*  HCT 28.3* 30.2* 30.0*  PLT 659* 635*  --   CREATININE  --  1.18* 1.10*    Estimated Creatinine Clearance: 63.4 mL/min (A) (by C-G formula based on SCr of 1.1 mg/dL (H)).   Medical History: Past Medical History:  Diagnosis Date  . Anginal pain ([place])   . Anxiety   . Chronic lower back pain 04/17/2012   "just got over some; catched when I walked"  . COPD (chronic obstructive pulmonary disease) ([place])   . Critical lower limb ischemia 05/27/2018  . Depression   . Headache(784.0) 04/17/2012   "~ qod; lately waking up in am w/one"  . Heart attack ([place])   . High cholesterol   . Hypertension   . Migraines 04/17/2012  . Obesity   . Shortness of breath 04/17/2012   "all the time"  . Sleep apnea    scheduled to get her CPAP machine this week  . Stroke ([place])   . Type II diabetes mellitus ([place]) 08/28/2002    Medications:   (Not in a hospital admission) Scheduled:   Assessment: Emonii was recently dc from here for amputation of toes. She presented with new PE today. IV heparin has been ordered for anticoagulation. Pt has had multiple medical hx including CVA.   Goal of Therapy:  Heparin level 0.3-0.7 units/ml Monitor platelets by anticoagulation protocol: Yes   Plan:  Heparin bolus 4000 units x1 Heparin infusion at 1050 units/hr F/u with 6 hr heparin level Daily level and CBC  Onnie Boer, PharmD, BCIDP, AAHIVP, CPP Infectious Disease Pharmacist Pager: [phone] 06/22/2018 5:09 PM

## 2018-06-22 NOTE — ED Provider Notes (Signed)
[facility] EMERGENCY DEPARTMENT Provider Note   CSN: [id number] Arrival date & time: 06/22/18  1106     History   Chief Complaint Chief Complaint  Patient presents with  . Chest Pain  . Shortness of Breath  . Hypertension    HPI Diamond Rice is a 47 y.o. female with a past medical history of DM 2, COPD, MI, CVA with visual left-sided weakness who presents today for evaluation of sudden onset chest pain, shortness of breath, nausea and vomiting.  She is currently residing at [facility] facility after she had multiple left toe amputations 2 weeks ago.  She reports that over the past hour she has thrown up 4 times with sudden onset of left-sided chest pain, that does not radiate or move, and associated shortness of breath.  She denies any abdominal pain.  She denies any recent trauma.  HPI  Past Medical History:  Diagnosis Date  . Anginal pain ([place])   . Anxiety   . Chronic lower back pain 04/17/2012   "just got over some; catched when I walked"  . COPD (chronic obstructive pulmonary disease) ([place])   . Critical lower limb ischemia 05/27/2018  . Depression   . Headache(784.0) 04/17/2012   "~ qod; lately waking up in am w/one"  . Heart attack ([place])   . High cholesterol   . Hypertension   . Migraines 04/17/2012  . Obesity   . Shortness of breath 04/17/2012   "all the time"  . Sleep apnea    scheduled to get her CPAP machine this week  . Stroke ([place])   . Type II diabetes mellitus ([place]) 08/28/2002    Patient Active Problem List   Diagnosis Date Noted  . Microcytic hypochromic anemia 06/17/2018  . At risk for adverse drug reaction 06/11/2018  . Critical lower limb ischemia 05/27/2018  . GI bleeding 04/02/2018  . Acute renal failure (ARF) ([place]) 04/02/2018  . Hypokalemia 04/02/2018  . Polysubstance abuse ([place]) 04/02/2018  . Diabetic foot ulcer ([place]) 04/02/2018  . Cannabis use disorder, moderate, dependence ([place]) 05/19/2017  . Cocaine  use disorder, moderate, dependence ([place]) 05/19/2017  . Schizoaffective disorder, bipolar type ([place]) 10/07/2015  . OSA (obstructive sleep apnea) 06/09/2015  . Vitamin D deficiency 01/06/2015  . Bilateral knee pain 01/05/2015  . Numbness and tingling in right hand 01/05/2015  . Insomnia 12/07/2014  . Right-sided lacunar infarction ([place]) 09/15/2014  . Left hemiparesis ([place]) 09/15/2014  . Dyspnea   . Back pain 09/11/2013  . Psychoactive substance-induced organic mood disorder ([place]) 05/28/2013  . Cocaine abuse with cocaine-induced mood disorder ([place]) 05/28/2013  . Cannabis dependence with cannabis-induced anxiety disorder ([place]) 05/28/2013  . Morbid obesity with BMI of 45.0-49.9, adult ([place]) 04/25/2012  . Chest pain 04/17/2012  . Hypertension 04/17/2012  . Diabetes mellitus type 2 with complications, uncontrolled ([place]) 04/17/2012  . Hyperlipidemia 04/17/2012  . Tobacco use 04/17/2012    Past Surgical History:  Procedure Laterality Date  . ABDOMINAL AORTOGRAM W/LOWER EXTREMITY Left 05/27/2018   Procedure: ABDOMINAL AORTOGRAM W/LOWER EXTREMITY Run[place] and Possible Intervention;  Surgeon: Waynetta Sandy, MD;  Location: [facility];  Service: Cardiovascular;  Laterality: Left;  . APPLICATION OF WOUND VAC Left 06/06/2018   Procedure: APPLICATION OF WOUND VAC;  Surgeon: Waynetta Sandy, MD;  Location: [facility];  Service: Vascular;  Laterality: Left;  . BIOPSY  04/04/2018   Procedure: BIOPSY;  Surgeon: [name], MD;  Location[facility] ENDOSCOPY;  Service: Endoscopy;;  .  CARDIAC CATHETERIZATION  ~ 2011  . COLONOSCOPY N/A 04/04/2018   Procedure: COLONOSCOPY;  Surgeon: [name], MD;  Location: [facility] ENDOSCOPY;  Service: Endoscopy;  Laterality: N/A;  . LOWER EXTREMITY ANGIOGRAM Left 05/27/2018  . PERIPHERAL VASCULAR INTERVENTION Left 05/27/2018   Procedure: PERIPHERAL VASCULAR INTERVENTION;  Surgeon: Waynetta Sandy, MD;  Location: [facility];  Service:  Cardiovascular;  Laterality: Left;  . POLYPECTOMY  04/04/2018   Procedure: POLYPECTOMY;  Surgeon: [name], MD;  Location: [facility] ENDOSCOPY;  Service: Endoscopy;;  . TRANSMETATARSAL AMPUTATION Left 05/28/2018   Procedure: AMPUTATION TOES THREE, FOUR AND FIVE on left foot;  Surgeon: Waynetta Sandy, MD;  Location: [facility];  Service: Vascular;  Laterality: Left;  . WOUND DEBRIDEMENT Left 06/06/2018   Procedure: DEBRIDEMENT WOUND LEFT FOOT;  Surgeon: Waynetta Sandy, MD;  Location: [facility];  Service: Vascular;  Laterality: Left;     OB History   None      Home Medications    Prior to Admission medications   Medication Sig Start Date End Date Taking? Authorizing Provider  alum & mag hydroxide-simeth (MAALOX/MYLANTA) 200-200-20 MG/5ML suspension Take 30 mLs by mouth every 2 (two) hours. For 24 hrs   Yes [provider]  ARIPiprazole (ABILIFY) 15 MG tablet Take 1 tablet (15 mg total) by mouth at bedtime. 01/15/18  Yes Patrecia Pour, NP  aspirin EC 81 MG EC tablet Take 1 tablet (81 mg total) by mouth daily. 06/01/18  Yes Ulyses Amor, PA-C  cloNIDine (CATAPRES) 0.3 MG tablet Take 1 tablet (0.3 mg total) by mouth 3 (three) times daily. 01/15/18  Yes Patrecia Pour, NP  clopidogrel (PLAVIX) 75 MG tablet Take 1 tablet (75 mg total) by mouth daily. 06/01/18  Yes Dagoberto Ligas, PA-C  ferrous sulfate 325 (65 FE) MG tablet Take 325 mg by mouth 2 (two) times daily with a meal.   Yes [provider]  furosemide (LASIX) 40 MG tablet Take 40 mg by mouth daily.   Yes [provider]  gabapentin (NEURONTIN) 100 MG capsule Take 100 mg by mouth 3 (three) times daily.   Yes [provider]  hydrALAZINE (APRESOLINE) 50 MG tablet Take 1 tablet (50 mg total) by mouth every 8 (eight) hours. 01/15/18  Yes Patrecia Pour, NP  Insulin Detemir (LEVEMIR) 100 UNIT/ML Pen Inject 15 Units into the skin 2 (two) times daily.    Yes [provider]  lisinopril  (PRINIVIL,ZESTRIL) 20 MG tablet Take 20 mg by mouth daily.   Yes [provider]  Melatonin 3 MG TABS Take 3 mg by mouth at bedtime.   Yes [provider]  oxyCODONE (OXY IR/ROXICODONE) 5 MG immediate release tablet Take 1-2 tablets (5-10 mg total) by mouth every 4 (four) hours as needed for moderate pain. 05/31/18  Yes Ulyses Amor, PA-C  oxyCODONE (ROXICODONE) 15 MG immediate release tablet Take 7.5 mg by mouth every 4 (four) hours as needed for pain.   Yes [provider]  pentoxifylline (TRENTAL) 400 MG CR tablet Take 400 mg by mouth 3 (three) times daily with meals.    Yes [provider]  polyethylene glycol (MIRALAX / GLYCOLAX) packet Take 17 g by mouth daily.   Yes [provider]  potassium chloride (K-DUR) 10 MEQ tablet Take 2 tablets (20 mEq total) by mouth daily. 05/23/18  Yes Carlyle Basques, MD  sennosides-docusate sodium (SENOKOT-S) 8.6-50 MG tablet Take 2 tablets by mouth 2 (two) times daily.  Yes [provider]    Family History Family History  Problem Relation Age of Onset  . Stroke Brother 28  . Stroke Brother 9  . Heart attack Mother 31  . Stroke Father 46    Social History Social History   Tobacco Use  . Smoking status: Former Smoker    Packs/day: 0.25    Years: 28.00    Pack years: 7.00    Types: Cigarettes  . Smokeless tobacco: Never Used  Substance Use Topics  . Alcohol use: No    Alcohol/week: 0.0 standard drinks    Comment: rare  . Drug use: Yes    Types: Marijuana, "Crack" cocaine    Comment: marijuana last use days ago; last cocaine several months ago     Allergies   Morphine and related and Metformin   Review of Systems Review of Systems  Constitutional: Negative for chills and fever.  Respiratory: Positive for chest tightness and shortness of breath.   Cardiovascular: Positive for chest pain. Negative for palpitations and leg swelling.  Gastrointestinal: Positive for nausea and  vomiting. Negative for abdominal pain and diarrhea.  Neurological: Negative for weakness (none that is new) and headaches.  All other systems reviewed and are negative.    Physical Exam Updated Vital Signs BP (!) 190/85   Pulse 98   Temp 98.4 F (36.9 C) (Oral)   Resp (!) 27   SpO2 100%   Physical Exam  Constitutional: She appears well-developed and well-nourished. No distress.  HENT:  Head: Normocephalic.  Neck: Normal range of motion. Neck supple.  Cardiovascular: Regular rhythm. Tachycardia present.  No murmur heard. Pulses:      Radial pulses are 2+ on the right side, and 2+ on the left side.       Dorsalis pedis pulses are 1+ on the right side, and 1+ on the left side.       Posterior tibial pulses are 1+ on the right side, and 1+ on the left side.  Pulmonary/Chest: Effort normal and breath sounds normal. No accessory muscle usage or stridor. No tachypnea. No respiratory distress. She has no decreased breath sounds.  Abdominal: Soft. Bowel sounds are normal. She exhibits no distension. There is no tenderness.  Musculoskeletal:       Right lower leg: Normal. She exhibits no edema.       Left lower leg: Normal. She exhibits no edema.  Left foot amputation site reviewed, no obvious purulent drainage.  Neurological: She is alert.  No pronator drift.  Face is symmetric, speech is not slurred.  Skin: Skin is warm and dry.  Nursing note and vitals reviewed.    ED Treatments / Results  Labs (all labs ordered are listed, but only abnormal results are displayed) Labs Reviewed  CBC WITH DIFFERENTIAL/PLATELET - Abnormal; Notable for the following components:      Result Value   Hemoglobin 9.0 (*)    HCT 30.2 (*)    MCV 73.7 (*)    MCH 22.0 (*)    MCHC 29.8 (*)    RDW 17.3 (*)    Platelets 635 (*)    Eosinophils Absolute 0.7 (*)    Abs Immature Granulocytes 0.08 (*)    All other components within normal limits  COMPREHENSIVE METABOLIC PANEL - Abnormal; Notable for the  following components:   Potassium 3.4 (*)    Glucose, Bld 195 (*)    Creatinine, Ser 1.18 (*)    Albumin 3.3 (*)    AST 14 (*)  GFR calc non Af Amer 54 (*)    All other components within normal limits  D-DIMER, QUANTITATIVE (NOT AT [facility]) - Abnormal; Notable for the following components:   D-Dimer, Quant 4.31 (*)    All other components within normal limits  I-STAT CHEM 8, ED - Abnormal; Notable for the following components:   Creatinine, Ser 1.10 (*)    Glucose, Bld 192 (*)    Hemoglobin 10.2 (*)    HCT 30.0 (*)    All other components within normal limits  LIPASE, BLOOD  URINALYSIS, ROUTINE W REFLEX MICROSCOPIC  RAPID URINE DRUG SCREEN, HOSP PERFORMED  HEPARIN LEVEL (UNFRACTIONATED)  HEPARIN LEVEL (UNFRACTIONATED)  CBC  PROTIME-INR  BRAIN NATRIURETIC PEPTIDE  I-STAT TROPONIN, ED  I-STAT CG4 LACTIC ACID, ED  I-STAT TROPONIN, ED  I-STAT CG4 LACTIC ACID, ED    EKG EKG Interpretation  Date/Time:  Saturday June 22 2018 11:22:42 EDT Ventricular Rate:  107 PR Interval:    QRS Duration: 80 QT Interval:  333 QTC Calculation: 445 R Axis:   34 Text Interpretation:  Sinus tachycardia Probable left atrial enlargement Probable LVH with secondary repol abnrm No STEMI.  Confirmed by Nanda Quinton [phone]) on 06/22/2018 11:25:59 AM   Radiology Dg Chest 2 View  Result Date: 06/22/2018 CLINICAL DATA:  Shortness of breath. EXAM: CHEST - 2 VIEW COMPARISON:  04/09/2018 FINDINGS: The heart is mildly enlarged but stable. The mediastinal and hilar contours are within normal limits and stable. Streaky subsegmental atelectasis but no infiltrates, effusions or edema. The bony thorax is intact. IMPRESSION: Streaky basilar atelectasis.  No infiltrates or effusions. Electronically Signed   By: Marijo Sanes M.D.   On: 06/22/2018 13:15   Ct Angio Chest Pe W/cm &/or Wo Cm  Result Date: 06/22/2018 CLINICAL DATA:  Shortness of breath. Evaluate for pulmonary embolus. EXAM: CT ANGIOGRAPHY CHEST  WITH CONTRAST TECHNIQUE: Multidetector CT imaging of the chest was performed using the standard protocol during bolus administration of intravenous contrast. Multiplanar CT image reconstructions and MIPs were obtained to evaluate the vascular anatomy. CONTRAST:  60 ml ISOVUE-370 IOPAMIDOL (ISOVUE-370) INJECTION 76% COMPARISON:  Chest x-ray on 06/22/2018 FINDINGS: Cardiovascular: Heart is mildly enlarged. There is no pericardial effusion. There are punctate calcifications within the coronary arteries. There are focal areas of calcification within the thoracic aorta, not associated with aneurysm. Incidental note is made of bovine arch anatomy. The pulmonary arteries are well opacified by contrast bolus. Within the RIGHT LOWER lobe pulmonary artery branches, there is a small focal embolus, best seen on image 188/10. RV: LV ratio is 0.9. Mediastinum/Nodes: The esophagus is patulous and contains an air-fluid level. There is circumferential thickening of the LOWER esophagus. Lungs/Pleura: There are no focal consolidations or pleural effusions. There focal areas of ground-glass opacity within the posterior LOWER lobes bilaterally. Upper Abdomen: No acute abnormality. Musculoskeletal: There are significant osteophytes of the midthoracic spine. No suspicious lytic or blastic lesions are identified. Review of the MIP images confirms the above findings. IMPRESSION: 1. RIGHT LOWER lobe pulmonary embolus. No evidence for RIGHT heart strain. 2.  Aortic atherosclerosis.  (ICD10-I70.0) bovine arch anatomy. 3. Mild cardiomegaly. 4. Patulous, thick-walled esophagus with air-fluid level. The findings are abnormal. Considerations include esophagitis, gastroesophageal reflux, scleroderma. 5. Nonspecific ground-glass opacities in the LOWER lobes bilaterally, consistent with mild edema, infarct, or pneumonitis. These results were called by telephone at the time of interpretation on 06/22/2018 at 4:46 pm to Dr. Wyn Quaker , who  verbally acknowledged these results. Electronically Signed  By: Nolon Nations M.D.   On: 06/22/2018 16:49    Procedures Procedures   CRITICAL CARE Performed by: Wyn Quaker Total critical care time: 40 minutes Critical care time was exclusive of separately billable procedures and treating other patients. Critical care was necessary to treat or prevent imminent or life-threatening deterioration. Critical care was time spent personally by me on the following activities: development of treatment plan with patient and/or surrogate as well as nursing, discussions with consultants, evaluation of patient's response to treatment, examination of patient, obtaining history from patient or surrogate, ordering and performing treatments and interventions, ordering and review of laboratory studies, ordering and review of radiographic studies, pulse oximetry and re-evaluation of patient's condition.   Medications Ordered in ED Medications  heparin bolus via infusion 4,000 Units (has no administration in time range)  heparin ADULT infusion 100 units/mL (25000 units/270mL sodium chloride 0.45%) (has no administration in time range)  iopamidol (ISOVUE-370) 76 % injection 100 mL (100 mLs Intravenous Contrast Given 06/22/18 1618)     Initial Impression / Assessment and Plan / ED Course  I have reviewed the triage vital signs and the nursing notes.  Pertinent labs & imaging results that were available during my care of the patient were reviewed by me and considered in my medical decision making (see chart for details).  Clinical Course as of Jun 22 1730  Sat Jun 22, 2018  1416 Followed up with lab, this has not been run.  Reordered.  I-stat troponin, ED [EH]  1421 Asked Rn/tech to re-draw troponin.    [EH]  1443 Troponin i, poc: 0.00 [EH]    Clinical Course User Index [EH] Lorin Glass, PA-C   Patient presents today for evaluation of chest pain, and shortness of breath.  Upon  arrival she was hypertensive, tachycardic.  She is afebrile.  She has been having multiple episodes of vomiting.  All of her symptoms started approximately 1 hour prior to arrival.  Labs are consistent with slightly improved hemoglobin up to 9.0.  White count is not elevated at 6.9.  Plan is 1.18 with GFR over 60, consistent with her baseline.  Glucose is mildly elevated at 195.  Given chest pain, shortness of breath, in the setting of recent surgery high suspicion for PE.  D-dimer was significantly elevated at 4.31.  CT angios PE showed a right lower lobe pulmonary emboli without evidence of right heart strain.  She has mild cardiomegaly, along with patulous thick-walled esophagus with air-fluid levels present.  She also has bilateral patchy areas either pneumonia, or edema.  BNP ordered.  Given lack of infectious symptoms will hold on antibiotics for now. Heparin ordered per pharmacy consult.  Initial EKG without evidence of ischemia.  Initial troponin is not elevated.    I spoke with internal medicine teaching service resident who agreed to come see patient for admission.   Final Clinical Impressions(s) / ED Diagnoses   Final diagnoses:  Single subsegmental pulmonary embolism without acute cor pulmonale  Esophageal abnormality    ED Discharge Orders    None       Lorin Glass, Hershal Coria 06/22/18 1731    Long, Wonda Olds, MD 06/22/18 1827

## 2018-06-23 DIAGNOSIS — E1159 Type 2 diabetes mellitus with other circulatory complications: Secondary | ICD-10-CM

## 2018-06-23 DIAGNOSIS — Z794 Long term (current) use of insulin: Secondary | ICD-10-CM

## 2018-06-23 DIAGNOSIS — Z6835 Body mass index (BMI) 35.0-35.9, adult: Secondary | ICD-10-CM

## 2018-06-23 DIAGNOSIS — I739 Peripheral vascular disease, unspecified: Secondary | ICD-10-CM

## 2018-06-23 DIAGNOSIS — E66812 Obesity, class 2: Secondary | ICD-10-CM

## 2018-06-23 DIAGNOSIS — I2699 Other pulmonary embolism without acute cor pulmonale: Secondary | ICD-10-CM

## 2018-06-23 DIAGNOSIS — Z89429 Acquired absence of other toe(s), unspecified side: Secondary | ICD-10-CM

## 2018-06-23 DIAGNOSIS — Z8739 Personal history of other diseases of the musculoskeletal system and connective tissue: Secondary | ICD-10-CM

## 2018-06-23 LAB — BASIC METABOLIC PANEL WITH GFR
Anion gap: 6 (ref 5–15)
BUN: 8 mg/dL (ref 6–20)
CO2: 24 mmol/L (ref 22–32)
Calcium: 9.2 mg/dL (ref 8.9–10.3)
Chloride: 104 mmol/L (ref 98–111)
Creatinine, Ser: 1.1 mg/dL — ABNORMAL HIGH (ref 0.44–1.00)
GFR calc Af Amer: >60 mL/min
GFR calc non Af Amer: 59 mL/min — ABNORMAL LOW
Glucose, Bld: 173 mg/dL — ABNORMAL HIGH (ref 70–99)
Potassium: 3.4 mmol/L — ABNORMAL LOW (ref 3.5–5.1)
Sodium: 134 mmol/L — ABNORMAL LOW (ref 135–145)

## 2018-06-23 LAB — FERRITIN: FERRITIN: 384 ng/mL — AB (ref 11–307)

## 2018-06-23 LAB — GLUCOSE, CAPILLARY
GLUCOSE-CAPILLARY: 106 mg/dL — AB (ref 70–99)
GLUCOSE-CAPILLARY: 162 mg/dL — AB (ref 70–99)
Glucose-Capillary: 129 mg/dL — ABNORMAL HIGH (ref 70–99)
Glucose-Capillary: 198 mg/dL — ABNORMAL HIGH (ref 70–99)

## 2018-06-23 LAB — IRON AND TIBC
IRON: 58 ug/dL (ref 28–170)
SATURATION RATIOS: 21 % (ref 10.4–31.8)
TIBC: 283 ug/dL (ref 250–450)
UIBC: 225 ug/dL

## 2018-06-23 LAB — RETICULOCYTES
Immature Retic Fract: 9.6 % (ref 2.3–15.9)
RBC.: 3.78 MIL/uL — AB (ref 3.87–5.11)
Retic Count, Absolute: 62.7 10*3/uL (ref 19.0–186.0)
Retic Ct Pct: 1.7 % (ref 0.4–3.1)

## 2018-06-23 MED ORDER — FUROSEMIDE 10 MG/ML IJ SOLN
40.0000 mg | Freq: Once | INTRAMUSCULAR | Status: AC
  Administered 2018-06-23: 40 mg via INTRAVENOUS
  Filled 2018-06-23: qty 4

## 2018-06-23 MED ORDER — LISINOPRIL 20 MG PO TABS
30.0000 mg | ORAL_TABLET | Freq: Every day | ORAL | Status: DC
  Administered 2018-06-23 – 2018-06-25 (×3): 30 mg via ORAL
  Filled 2018-06-23 (×4): qty 1

## 2018-06-23 NOTE — Plan of Care (Signed)
Discussed with pt and provider open surgical wound to left foot.  Presently without s/s of infection.  MD ordered wound consult.

## 2018-06-23 NOTE — Progress Notes (Signed)
Family Medicine Teaching Service Daily Progress Note Intern Pager: [phone]  [name] Medical record number: [id number] Date of birth: November 29, 1970 Age: 47 y.o. Gender: female  Primary Care Provider: [name], MD Consultants: none Code Status: FULL  Pt Overview and Major Events to Date:  Admit 06/22/18  Assessment and Plan: Diamond Rice is a 47 y.o. female presenting with PE . PMH is significant for T2DM, COPD, MI, CVA, mood disorder with residual left-sided weakness.  Shortness of Breath, Chest Pain- 2/2 acute RLL PE found on CTA following elevated d-dimer of 4.31.  Suspect from recent surgery as she underwent toe amputation on 10/01 with only physical activity being with PT- otherwise does not walk.  S/p heparin gtt in ED > now on xarelto 15 mg BID and aspirin.  Stable and satting 100% on room air with no tachypneia noted.   Normal coags. No indication for hypercoagulable workup in this setting. - monitor on tele - continue xarelto and aspirin - continuous pulse ox - holding home plavix and trental  M3NT with complication- patient states she takes 15 levemir in morning and at night. She takes gabapentin 100mg  TID. patient had 3-5 digit on left foot amputated 10/01. On exam, wound is malodorous with granulation tissue in base. Approximately 5x4cm diameter. Patient is afebrile with WBC 6.9. She does not have significant foot pain.  A1c on admission 6.8.  - 8 units lantus BID - continue gabapentin - CBGs - moderate SSI - carb modified diet  HTN- AM BP 160/97.  Hypertensive on admission with BP max 190/85 and 172/110. Patient denies headache and vision changes.  Patient takes clonidine 0.3 mg TID, lasix 40mg  daily, lisinopril 30mg  daily, hydralazine 50mg  TID. Diagnosis of OSA on problem list, CPAP compliance may play a role in HTN.  - patient using CPAP at home and at nursing home  - continue home meds - restart home lisinopril  H/o CVA- 2016 on  plavix 75mg  daily and asa 81 home meds. - continue home ASA - hold plavix   Microcytic Anemia- Hgb 9.0 today appears to be increased from baseline in ~8.3 in recent past. Patient takes iron supplement at home. Will continue to monitor.  AM CBC with Hgb 8.0, transfusion threshold is 7.0.  -tibc, ferritin and retic -monitor on cbc   Constipation- patient takes miralax, senokot at home. Likely exacerbated by patient's other home meds of oxycodone and iron - continue miralax and senokot  FEN/GI: heart healthy, carb modified Prophylaxis: xarelto   Disposition: monitor on tele  Subjective:  Patient feels overall better, had some questions regarding anticoagulation which were addressed.  She denies SOB, CP.  Has been using her CPAP at home and at [facility] No other concerns at this time.   Objective: Temp:  [97.7 F (36.5 C)-98.5 F (36.9 C)] 98.5 F (36.9 C) (10/27 0803) Pulse Rate:  [79-110] 79 (10/27 0803) Resp:  [14-27] 17 (10/27 0803) BP: (138-190)/(79-110) 174/96 (10/27 0803) SpO2:  [99 %-100 %] 100 % (10/27 0803)  Physical Exam: General: 47 yo female, NAD, sitting upright in bed   Neck: supple Cardiovascular: RRR, 4/6 holosystolic murmur Respiratory: CTAB  Gastrointestinal: soft, NTND, +bs  MSK: 3-5 digit amputation on right foot  Derm: ~4x5cm lesion with base of healthy granulation tissue, purulent discharge around edges with foul smell  Laboratory: Recent Labs  Lab 06/20/18 1218 06/22/18 1230 06/22/18 1244 06/23/18 0303  WBC 9.3 6.9  --  10.6*  HGB 8.6* 9.0* 10.2* 8.0*  HCT 28.3* 30.2* 30.0* 25.8*  PLT 659* 635*  --  569*   Recent Labs  Lab 06/22/18 1230 06/22/18 1244 06/23/18 0303  NA 137 138 134*  K 3.4* 3.5 3.4*  CL 103 101 104  CO2 25  --  24  BUN 7 7 8   CREATININE 1.18* 1.10* 1.10*  CALCIUM 9.4  --  9.2  PROT 7.5  --   --   BILITOT 0.7  --   --   ALKPHOS 72  --   --   ALT 9  --   --   AST 14*  --   --   GLUCOSE 195* 192* 173*     Imaging/Diagnostic Tests: Dg Chest 2 View  Result Date: 06/22/2018 CLINICAL DATA:  Shortness of breath. EXAM: CHEST - 2 VIEW COMPARISON:  04/09/2018 FINDINGS: The heart is mildly enlarged but stable. The mediastinal and hilar contours are within normal limits and stable. Streaky subsegmental atelectasis but no infiltrates, effusions or edema. The bony thorax is intact. IMPRESSION: Streaky basilar atelectasis.  No infiltrates or effusions. Electronically Signed   By: Marijo Sanes M.D.   On: 06/22/2018 13:15   Ct Angio Chest Pe W/cm &/or Wo Cm  Result Date: 06/22/2018 CLINICAL DATA:  Shortness of breath. Evaluate for pulmonary embolus. EXAM: CT ANGIOGRAPHY CHEST WITH CONTRAST TECHNIQUE: Multidetector CT imaging of the chest was performed using the standard protocol during bolus administration of intravenous contrast. Multiplanar CT image reconstructions and MIPs were obtained to evaluate the vascular anatomy. CONTRAST:  60 ml ISOVUE-370 IOPAMIDOL (ISOVUE-370) INJECTION 76% COMPARISON:  Chest x-ray on 06/22/2018 FINDINGS: Cardiovascular: Heart is mildly enlarged. There is no pericardial effusion. There are punctate calcifications within the coronary arteries. There are focal areas of calcification within the thoracic aorta, not associated with aneurysm. Incidental note is made of bovine arch anatomy. The pulmonary arteries are well opacified by contrast bolus. Within the RIGHT LOWER lobe pulmonary artery branches, there is a small focal embolus, best seen on image 188/10. RV: LV ratio is 0.9. Mediastinum/Nodes: The esophagus is patulous and contains an air-fluid level. There is circumferential thickening of the LOWER esophagus. Lungs/Pleura: There are no focal consolidations or pleural effusions. There focal areas of ground-glass opacity within the posterior LOWER lobes bilaterally. Upper Abdomen: No acute abnormality. Musculoskeletal: There are significant osteophytes of the midthoracic spine. No  suspicious lytic or blastic lesions are identified. Review of the MIP images confirms the above findings. IMPRESSION: 1. RIGHT LOWER lobe pulmonary embolus. No evidence for RIGHT heart strain. 2.  Aortic atherosclerosis.  (ICD10-I70.0) bovine arch anatomy. 3. Mild cardiomegaly. 4. Patulous, thick-walled esophagus with air-fluid level. The findings are abnormal. Considerations include esophagitis, gastroesophageal reflux, scleroderma. 5. Nonspecific ground-glass opacities in the LOWER lobes bilaterally, consistent with mild edema, infarct, or pneumonitis. These results were called by telephone at the time of interpretation on 06/22/2018 at 4:46 pm to Dr. Wyn Quaker , who verbally acknowledged these results. Electronically Signed   By: Nolon Nations M.D.   On: 06/22/2018 16:49    Lovenia Kim, MD 06/23/2018, 9:07 AM PGY-3, [place] Intern pager: [phone], text pages welcome

## 2018-06-24 ENCOUNTER — Encounter (HOSPITAL_COMMUNITY): Payer: Self-pay | Admitting: *Deleted

## 2018-06-24 ENCOUNTER — Inpatient Hospital Stay (HOSPITAL_COMMUNITY): Payer: Medicare Other

## 2018-06-24 DIAGNOSIS — I351 Nonrheumatic aortic (valve) insufficiency: Secondary | ICD-10-CM

## 2018-06-24 DIAGNOSIS — K229 Disease of esophagus, unspecified: Secondary | ICD-10-CM

## 2018-06-24 DIAGNOSIS — W44F3XA Food entering into or through a natural orifice, initial encounter: Secondary | ICD-10-CM

## 2018-06-24 DIAGNOSIS — Z89422 Acquired absence of other left toe(s): Secondary | ICD-10-CM

## 2018-06-24 DIAGNOSIS — D509 Iron deficiency anemia, unspecified: Secondary | ICD-10-CM

## 2018-06-24 LAB — CBC
HCT: 25.8 % — ABNORMAL LOW (ref 36.0–46.0)
HCT: 25 % — ABNORMAL LOW (ref 36.0–46.0)
HEMOGLOBIN: 8 g/dL — AB (ref 12.0–15.0)
HEMOGLOBIN: 7.6 g/dL — AB (ref 12.0–15.0)
MCH: 22.2 pg — AB (ref 26.0–34.0)
MCH: 21.8 pg — AB (ref 26.0–34.0)
MCHC: 31 g/dL (ref 30.0–36.0)
MCHC: 30.4 g/dL (ref 30.0–36.0)
MCV: 71.7 fL — ABNORMAL LOW (ref 80.0–100.0)
MCV: 71.8 fL — ABNORMAL LOW (ref 80.0–100.0)
Platelets: 569 10*3/uL — ABNORMAL HIGH (ref 150–400)
Platelets: 511 10*3/uL — ABNORMAL HIGH (ref 150–400)
RBC: 3.6 MIL/uL — AB (ref 3.87–5.11)
RBC: 3.48 MIL/uL — ABNORMAL LOW (ref 3.87–5.11)
RDW: 17.2 % — ABNORMAL HIGH (ref 11.5–15.5)
RDW: 17 % — ABNORMAL HIGH (ref 11.5–15.5)
WBC: 10.6 10*3/uL — AB (ref 4.0–10.5)
WBC: 8.8 10*3/uL (ref 4.0–10.5)
nRBC: 0 % (ref 0.0–0.2)
nRBC: 0 % (ref 0.0–0.2)

## 2018-06-24 LAB — BASIC METABOLIC PANEL
ANION GAP: 5 (ref 5–15)
BUN: 8 mg/dL (ref 6–20)
CHLORIDE: 104 mmol/L (ref 98–111)
CO2: 26 mmol/L (ref 22–32)
Calcium: 9 mg/dL (ref 8.9–10.3)
Creatinine, Ser: 1.12 mg/dL — ABNORMAL HIGH (ref 0.44–1.00)
GFR calc Af Amer: >60 mL/min (ref >60)
GFR calc non Af Amer: 58 mL/min — ABNORMAL LOW (ref >60)
Glucose, Bld: 165 mg/dL — ABNORMAL HIGH (ref 70–99)
POTASSIUM: 3.3 mmol/L — AB (ref 3.5–5.1)
SODIUM: 135 mmol/L (ref 135–145)

## 2018-06-24 LAB — GLUCOSE, CAPILLARY
GLUCOSE-CAPILLARY: 140 mg/dL — AB (ref 70–99)
GLUCOSE-CAPILLARY: 151 mg/dL — AB (ref 70–99)
GLUCOSE-CAPILLARY: 190 mg/dL — AB (ref 70–99)
GLUCOSE-CAPILLARY: 167 mg/dL — AB (ref 70–99)

## 2018-06-24 LAB — ECHOCARDIOGRAM COMPLETE

## 2018-06-24 MED ORDER — FUROSEMIDE 40 MG PO TABS
40.0000 mg | ORAL_TABLET | Freq: Every day | ORAL | Status: DC
  Administered 2018-06-24 – 2018-06-25 (×2): 40 mg via ORAL
  Filled 2018-06-24 (×2): qty 1

## 2018-06-24 MED ORDER — INSULIN ASPART 100 UNIT/ML ~~LOC~~ SOLN
0.0000 [IU] | Freq: Three times a day (TID) | SUBCUTANEOUS | Status: DC
  Administered 2018-06-24: 1 [IU] via SUBCUTANEOUS
  Administered 2018-06-24 (×2): 2 [IU] via SUBCUTANEOUS
  Administered 2018-06-25: 3 [IU] via SUBCUTANEOUS
  Administered 2018-06-25 (×2): 2 [IU] via SUBCUTANEOUS

## 2018-06-24 NOTE — Progress Notes (Signed)
  Echocardiogram 2D Echocardiogram has been performed.  Diamond Rice 06/24/2018, 11:59 AM

## 2018-06-24 NOTE — Progress Notes (Signed)
Rt at bedside, second attempt to place pt on CPAP. Pt states she is not ready for CPAP at this time.  Pt instructed to have RN call RT when she is ready for CPAP.

## 2018-06-24 NOTE — Progress Notes (Signed)
Family Medicine Teaching Service Daily Progress Note Intern Pager: [phone]  [name] Medical record number: [id number] Date of birth: 26-Apr-1971 Age: 47 y.o. Gender: female  Primary Care Provider: [name], MD Consultants: none Code Status: FULL  Pt Overview and Major Events to Date:  Admit 06/22/18  Assessment and Plan: Diamond Rice is a 47 y.o. female presenting with PE . PMH is significant for T2DM, COPD, MI, CVA, mood disorder with residual left-sided weakness.  Shortness of Breath, Chest Pain- 2/2 acute RLL PE found on CTA following elevated d-dimer of 4.31.  Suspect from recent surgery as she underwent toe amputation on 10/01 with only physical activity being with PT- otherwise does not walk.  S/p heparin gtt in ED > now on xarelto 15 mg BID and aspirin.  Some SOB overnight without hypoxia or CP.  O2 sats 100% on CPAP overnight.  This AM denies CP, SOB, O2 sats appropriate on RA. - monitor on tele - continue xarelto and aspirin, will speak with Diamond Rice about possible change to xarelto and plavix given decreased bleeding risk and better overall outcomes - continuous pulse ox - holding home plavix and trental - f/u Echo  G2XB with complication- patient states she takes 15 levemir in morning and at night. She takes gabapentin 100mg  TID. patient had 3-5 digit on left foot amputated 10/01. On exam, wound is malodorous with granulation tissue in base. Approximately 5x4cm diameter. Patient remains afebrile with WBC 8.8. She does not have significant foot pain.  A1c on admission 6.8.  - 8 units lantus BID - continue gabapentin - CBGs - moderate SSI - carb modified diet - wound care consulted for left foot wound  HTN- Patient takes clonidine 0.3 mg TID, lasix 40mg  daily, lisinopril 30mg  daily, hydralazine 50mg  TID. Diagnosis of OSA on problem list, CPAP compliance may play a role in HTN.  BP this AM 125/84, compliant with CPAP overnight. - patient  using CPAP at home and at nursing home  - continue home meds - restart home lisinopril - start Lasix 40mg  PO QD  H/o CVA- 2016 on plavix 75mg  daily and asa 81 home meds. - continue home ASA - hold plavix   Microcytic Anemia- Baseline 8.3 in past.  Hgb 10.2>8.0>7.6.  MCV 71.8.  Ferritin 384, otherwise anemia panel WNL with Iron 58.  Retic Ct 1.7%.  Transfusion threshold is 7.0. Patient with transfusion on 10/3, which may be increasing iron levels.  She has had a previous workup for rectal bleeding and it is possible that she may have a slow bleed at this time. -monitor cbc  - recommend outpatient EGD and if negative consider hematology referral as outpatient  Constipation- patient takes miralax, senokot at home. Likely exacerbated by patient's other home meds of oxycodone and iron - continue miralax and senokot  FEN/GI: heart healthy, carb modified Prophylaxis: xarelto   Disposition: pending clinical improvement  Subjective:  Reported SOB overnight without CP or desaturations.  Patient feeling well this AM.  No SOB or CP.  Objective: Temp:  [97.9 F (36.6 C)-98.5 F (36.9 C)] 98.4 F (36.9 C) (10/28 0609) Pulse Rate:  [69-93] 69 (10/28 0609) Resp:  [17-24] 18 (10/28 0609) BP: (125-174)/(78-102) 125/84 (10/28 0609) SpO2:  [98 %-100 %] 100 % (10/28 2841)  Physical Exam:  General: 47 y.o. female in NAD Cardio: RRR, 4/6 holosystolic murmur Lungs: CTAB, no wheezing, no rhonchi, no crackles Abdomen: Soft, non-tender to palpation, positive bowel sounds Skin: warm and dry  Extremities: 3-5 digit amputation on right foot with dry bandage   Laboratory: Recent Labs  Lab 06/22/18 1230 06/22/18 1244 06/23/18 0303 06/24/18 0333  WBC 6.9  --  10.6* 8.8  HGB 9.0* 10.2* 8.0* 7.6*  HCT 30.2* 30.0* 25.8* 25.0*  PLT 635*  --  569* 511*   Recent Labs  Lab 06/22/18 1230 06/22/18 1244 06/23/18 0303 06/24/18 0333  NA 137 138 134* 135  K 3.4* 3.5 3.4* 3.3*  CL 103 101 104  104  CO2 25  --  24 26  BUN 7 7 8 8   CREATININE 1.18* 1.10* 1.10* 1.12*  CALCIUM 9.4  --  9.2 9.0  PROT 7.5  --   --   --   BILITOT 0.7  --   --   --   ALKPHOS 72  --   --   --   ALT 9  --   --   --   AST 14*  --   --   --   GLUCOSE 195* 192* 173* 165*    Imaging/Diagnostic Tests: No results found.  [place], DO 06/24/2018, 7:33 AM PGY-1, [place] Intern pager: [phone], text pages welcome

## 2018-06-24 NOTE — Consult Note (Addendum)
[place] Nurse wound consult note Reason for Consult: Consult requested for left foot.  Pt is followed as an outpatient by a physician who just assessed and debrided the site on Fri, according to the patient.  He has ordered daily moist gauze dressings. Wound type: Left outer foot with full thickness wound from previous toe amputation site. Measurement: 4X5X.3cm Wound bed: 90% red, 10% yellow to wound edges Drainage (amount, consistency, odor) small amt yellow drainage, no odor  Periwound: intact skin surrounding Dressing procedure/placement/frequency: Continue present plan of care with moist gauze dressings.  Pt can resume follow-up with her physician after discharge. Please re-consult if further assistance is needed.  Thank-you,  Julien Girt MSN, [place], [place], [place], [place]

## 2018-06-25 LAB — GLUCOSE, CAPILLARY
Glucose-Capillary: 160 mg/dL — ABNORMAL HIGH (ref 70–99)
Glucose-Capillary: 177 mg/dL — ABNORMAL HIGH (ref 70–99)
Glucose-Capillary: 244 mg/dL — ABNORMAL HIGH (ref 70–99)

## 2018-06-25 LAB — BASIC METABOLIC PANEL
ANION GAP: 8 (ref 5–15)
BUN: 12 mg/dL (ref 6–20)
CALCIUM: 9.4 mg/dL (ref 8.9–10.3)
CO2: 25 mmol/L (ref 22–32)
Chloride: 103 mmol/L (ref 98–111)
Creatinine, Ser: 1.19 mg/dL — ABNORMAL HIGH (ref 0.44–1.00)
GFR, EST NON AFRICAN AMERICAN: 54 mL/min — AB (ref >60)
Glucose, Bld: 165 mg/dL — ABNORMAL HIGH (ref 70–99)
POTASSIUM: 3.5 mmol/L (ref 3.5–5.1)
Sodium: 136 mmol/L (ref 135–145)

## 2018-06-25 LAB — CBC
HCT: 25.9 % — ABNORMAL LOW (ref 36.0–46.0)
Hemoglobin: 8 g/dL — ABNORMAL LOW (ref 12.0–15.0)
MCH: 22.2 pg — ABNORMAL LOW (ref 26.0–34.0)
MCHC: 30.9 g/dL (ref 30.0–36.0)
MCV: 71.7 fL — ABNORMAL LOW (ref 80.0–100.0)
NRBC: 0 % (ref 0.0–0.2)
PLATELETS: 530 10*3/uL — AB (ref 150–400)
RBC: 3.61 MIL/uL — AB (ref 3.87–5.11)
RDW: 17.1 % — AB (ref 11.5–15.5)
WBC: 8.2 10*3/uL (ref 4.0–10.5)

## 2018-06-25 MED ORDER — RIVAROXABAN (XARELTO) VTE STARTER PACK (15 & 20 MG): ORAL_TABLET | ORAL | 0 refills | Status: DC

## 2018-06-25 MED ORDER — POLYETHYLENE GLYCOL 3350 17 G PO PACK
17.0000 g | PACK | Freq: Every day | ORAL | Status: DC
  Administered 2018-06-25: 17 g via ORAL
  Filled 2018-06-25: qty 1

## 2018-06-25 MED ORDER — OXYCODONE HCL 5 MG PO TABS: 5.0000 mg | ORAL_TABLET | ORAL | 0 refills | Status: DC | PRN

## 2018-06-25 MED ORDER — LISINOPRIL 30 MG PO TABS: 30.0000 mg | ORAL_TABLET | Freq: Every day | ORAL | 0 refills | Status: DC

## 2018-06-25 MED ORDER — RIVAROXABAN 15 MG PO TABS: 15.0000 mg | ORAL_TABLET | Freq: Two times a day (BID) | ORAL | 0 refills | Status: DC

## 2018-06-25 NOTE — Clinical Social Work Note (Signed)
Clinical Social Worker facilitated patient discharge including contacting patient family and facility to confirm patient discharge plans.  Clinical information faxed to facility and family agreeable with plan.  CSW arranged ambulance transport via [facility] to [facility] [phone]) .  RN to call [phone] for report prior to discharge.  Clinical Social Worker will sign off for now as social work intervention is no longer needed. Please consult [place] again if new need arises.  [place], [place]

## 2018-06-25 NOTE — Progress Notes (Signed)
2W 10 Jenissa, Tyrell Kay CPaP settings 4.o cm H2O Humidity 2, adaptive Tube is 22. Please let me know if you need anything else. Thank you!

## 2018-06-25 NOTE — Clinical Social Work Placement (Signed)
   CLINICAL SOCIAL WORK PLACEMENT  NOTE  Date:  06/25/2018  Patient Details  Name: Diamond Rice MRN: [id number] Date of Birth: [birth year]  Clinical Social Work is seeking post-discharge placement for this patient at the [place] level of care (*CSW will initial, date and re-position this form in  chart as items are completed):      Patient/family provided with [facility] Work Department's list of facilities offering this level of care within the geographic area requested by the patient (or if unable, by the patient's family).  Yes   Patient/family informed of their freedom to choose among providers that offer the needed level of care, that participate in Medicare, Medicaid or managed care program needed by the patient, have an available bed and are willing to accept the patient.      Patient/family informed of [facility]'s ownership interest in [facility] and [facility], as well as of the fact that they are under no obligation to receive care at these facilities.  PASRR submitted to EDS on       PASRR number received on 06/05/18     Existing PASRR number confirmed on       FL2 transmitted to all facilities in geographic area requested by pt/family on 06/05/18     FL2 transmitted to all facilities within larger geographic area on       Patient informed that his/her managed care company has contracts with or will negotiate with certain facilities, including the following:            Patient/family informed of bed offers received.  Patient chooses bed at [place] recommends and patient chooses bed at      Patient to be transferred to [facility] on 06/25/18.  Patient to be transferred to facility by [facility]     Patient family notified on 06/25/18 of transfer.  Name of family member notified:  Pt alert and oriented.     PHYSICIAN       Additional Comment:     _______________________________________________ Eileen Stanford, LCSW 06/25/2018, 2:14 PM

## 2018-06-25 NOTE — Clinical Social Work Note (Signed)
Pt was admitted from [facility]. Pt will return to [facility] at d/c.   [place], [place]

## 2018-06-25 NOTE — Clinical Social Work Note (Signed)
Clinical Social Work Assessment  Patient Details  Name: Diamond Rice MRN: [id number] Date of Birth: Mar 18, 1971  Date of referral:  06/25/18               Reason for consult:  Facility Placement                Permission sought to share information with:  Family Supports Permission granted to share information::     Name::     [employer]  Agency::  [facility]  Relationship::  Brother  Contact Information:  [phone]  Housing/Transportation Living arrangements for the past 2 months:  [place] of Information:  Patient Patient Interpreter Needed:  None Criminal Activity/Legal Involvement Pertinent to Current Situation/Hospitalization:  No - Comment as needed Significant Relationships:  Friend, Siblings Lives with:  Friends Do you feel safe going back to the place where you live?  Yes Need for family participation in patient care:  No (Coment)  Care giving concerns:  Pt is alert and oriented.   Social Worker assessment / plan:  CSW spoke with pt at bedside. Pt came from [facility]. Pt is agreeable to return. Pt will d/c today. CSW confirmed with facility, pt can return today.  Employment status:  Disabled (Comment on whether or not currently receiving Disability) Insurance information:  Medicare PT Recommendations:  [place] / Referral to community resources:  [place]  Patient/Family's Response to care:  Pt verbalized understanding of CSW role and expressed appreciation for support. Pt denies any concern regarding pt care at this time.   Patient/Family's Understanding of and Emotional Response to Diagnosis, Current Treatment, and Prognosis: Pt understanding and realistic regarding physical limitations. Pt understands the need for SNF placement at d/c. Pt agreeable to SNF placement at d/c, at this time. Pt's responses emotionally appropriate during conversation with CSW. Pt denies any concern regarding  treatment plan at this time. CSW will continue to provide support and facilitate d/c needs.     Emotional Assessment Appearance:  Appears stated age Attitude/Demeanor/Rapport:  (Patient was appropriate) Affect (typically observed):  Accepting, Appropriate, Calm Orientation:  Oriented to Self, Oriented to Place, Oriented to  Time, Oriented to Situation Alcohol / Substance use:  Not Applicable Psych involvement (Current and /or in the community):  No (Comment)  Discharge Needs  Concerns to be addressed:  Basic Needs Readmission within the last 30 days:  Yes Current discharge risk:  Dependent with Mobility Barriers to Discharge:  No Barriers Identified   Navi Ewton A Kennett Symes, LCSW 06/25/2018, 2:07 PM

## 2018-06-25 NOTE — Discharge Instructions (Signed)
Information on my medicine - XARELTO (rivaroxaban)  This medication education was reviewed with me or my healthcare representative as part of my discharge preparation.    WHY WAS XARELTO PRESCRIBED FOR YOU? Xarelto was prescribed to treat blood clots that may have been found in the veins of your legs (deep vein thrombosis) or in your lungs (pulmonary embolism) and to reduce the risk of them occurring again.  What do you need to know about Xarelto? The starting dose is one 15 mg tablet taken TWICE daily with food for the FIRST 21 DAYS then on 07/14/18 the dose is changed to one 20 mg tablet taken ONCE A DAY with your evening meal.  DO NOT stop taking Xarelto without talking to the health care provider who prescribed the medication.  Refill your prescription for 20 mg tablets before you run out.  After discharge, you should have regular check-up appointments with your healthcare provider that is prescribing your Xarelto.  In the future your dose may need to be changed if your kidney function changes by a significant amount.  What do you do if you miss a dose? If you are taking Xarelto TWICE DAILY and you miss a dose, take it as soon as you remember. You may take two 15 mg tablets (total 30 mg) at the same time then resume your regularly scheduled 15 mg twice daily the next day.  If you are taking Xarelto ONCE DAILY and you miss a dose, take it as soon as you remember on the same day then continue your regularly scheduled once daily regimen the next day. Do not take two doses of Xarelto at the same time.   Important Safety Information Xarelto is a blood thinner medicine that can cause bleeding. You should call your healthcare provider right away if you experience any of the following: ? Bleeding from an injury or your nose that does not stop. ? Unusual colored urine (red or dark brown) or unusual colored stools (red or black). ? Unusual bruising for unknown reasons. ? A serious fall or  if you hit your head (even if there is no bleeding).  Some medicines may interact with Xarelto and might increase your risk of bleeding while on Xarelto. To help avoid this, consult your healthcare provider or pharmacist prior to using any new prescription or non-prescription medications, including herbals, vitamins, non-steroidal anti-inflammatory drugs (NSAIDs) and supplements.  This website has more information on Xarelto: [email or web address]

## 2018-06-25 NOTE — NC FL2 (Signed)
[place] LEVEL OF CARE SCREENING TOOL     IDENTIFICATION  [name] Birthdate: 1971/07/10 Sex: female Admission Date (Current Location): 06/22/2018  [facility] and [place] Number:  Herbalist and Address:  [facility]. [facility], [place] [address] [place], [place] [address]      Provider Number: [id number]  Attending Physician Name and Address:  Martyn Malay, MD  Relative Name and Phone Number:       Current Level of Care: Hospital Recommended Level of Care: [place] Prior Approval Number:    Date Approved/Denied:   PASRR Number:    Discharge Plan: SNF    Current Diagnoses: Patient Active Problem List   Diagnosis Date Noted  . Esophageal abnormality   . Class 2 severe obesity due to excess calories with serious comorbidity and body mass index (BMI) of 35.0 to 35.9 in adult [facility])   . Peripheral vascular disease ([place])   . History of osteomyelitis   . History of amputation of lesser toe ([place])   . Pulmonary embolism ([place]) 06/22/2018  . Microcytic anemia 06/17/2018  . At risk for adverse drug reaction 06/11/2018  . Critical lower limb ischemia 05/27/2018  . GI bleeding 04/02/2018  . Acute renal failure (ARF) ([place]) 04/02/2018  . Hypokalemia 04/02/2018  . Polysubstance abuse ([place]) 04/02/2018  . Diabetic foot ulcer ([place]) 04/02/2018  . Cannabis use disorder, moderate, dependence ([place]) 05/19/2017  . Cocaine use disorder, moderate, dependence ([place]) 05/19/2017  . Schizoaffective disorder, bipolar type ([place]) 10/07/2015  . OSA (obstructive sleep apnea) 06/09/2015  . Vitamin D deficiency 01/06/2015  . Bilateral knee pain 01/05/2015  . Numbness and tingling in right hand 01/05/2015  . Insomnia 12/07/2014  . Right-sided lacunar infarction ([place]) 09/15/2014  . Left hemiparesis ([place]) 09/15/2014  . Dyspnea   . Back pain 09/11/2013  . Psychoactive substance-induced organic mood disorder ([place]) 05/28/2013  .  Cocaine abuse with cocaine-induced mood disorder ([place]) 05/28/2013  . Cannabis dependence with cannabis-induced anxiety disorder ([place]) 05/28/2013  . Morbid obesity with BMI of 45.0-49.9, adult ([place]) 04/25/2012  . Chest pain 04/17/2012  . Hypertension 04/17/2012  . Diabetes mellitus ([place]) 04/17/2012  . Hyperlipidemia 04/17/2012  . Tobacco use 04/17/2012    Orientation RESPIRATION BLADDER Height & Weight     Self, Time, Situation, Place  Normal Continent Weight:   Height:     BEHAVIORAL SYMPTOMS/MOOD NEUROLOGICAL BOWEL NUTRITION STATUS      Continent Diet(heart healthy/carb modified, thin liquids)  AMBULATORY STATUS COMMUNICATION OF NEEDS Skin   Limited Assist Verbally PU Stage and Appropriate Care(Closed incision left foot. Open wound right foot, toes amputated.)   PU Stage 2 Dressing: Daily(right buttocks, guaze dressing)                   Personal Care Assistance Level of Assistance  Bathing, Feeding, Dressing Bathing Assistance: Limited assistance Feeding assistance: Independent Dressing Assistance: Limited assistance     Functional Limitations Info  Sight, Hearing, Speech Sight Info: Adequate Hearing Info: Adequate Speech Info: Adequate    SPECIAL CARE FACTORS FREQUENCY  OT (By licensed OT), PT (By licensed PT)     PT Frequency: 5x OT Frequency: 5x            Contractures Contractures Info: Not present    Additional Factors Info  Code Status, Allergies Code Status Info: Full Code Allergies Info: Morphine And Related, Metformin           Current  Medications (06/25/2018):  This is the current hospital active medication list Current Facility-Administered Medications  Medication Dose Route Frequency Provider Last Rate Last Dose  . acetaminophen (TYLENOL) tablet 650 mg  650 mg Oral Q6H PRN Sela Hilding, MD       Or  . acetaminophen (TYLENOL) suppository 650 mg  650 mg Rectal Q6H PRN Sela Hilding, MD      . ARIPiprazole (ABILIFY) tablet 15  mg  15 mg Oral QHS Sela Hilding, MD   15 mg at 06/24/18 2131  . aspirin EC tablet 81 mg  81 mg Oral Daily Sela Hilding, MD   81 mg at 06/25/18 0848  . cloNIDine (CATAPRES) tablet 0.3 mg  0.3 mg Oral TID Sela Hilding, MD   0.3 mg at 06/25/18 0849  . furosemide (LASIX) tablet 40 mg  40 mg Oral Daily Guadalupe Dawn, MD   40 mg at 06/25/18 0847  . gabapentin (NEURONTIN) capsule 100 mg  100 mg Oral TID Sela Hilding, MD   100 mg at 06/25/18 0849  . hydrALAZINE (APRESOLINE) tablet 50 mg  50 mg Oral Q8H Sela Hilding, MD   50 mg at 06/25/18 4128  . Influenza vac split quadrivalent PF (FLUARIX) injection 0.5 mL  0.5 mL Intramuscular Prior to discharge Martyn Malay, MD      . insulin aspart (novoLOG) injection 0-9 Units  0-9 Units Subcutaneous TID WC Martyn Malay, MD   2 Units at 06/25/18 0751  . insulin detemir (LEVEMIR) injection 8 Units  8 Units Subcutaneous BID Sela Hilding, MD   8 Units at 06/25/18 0850  . lisinopril (PRINIVIL,ZESTRIL) tablet 30 mg  30 mg Oral Daily Lovenia Kim, MD   30 mg at 06/25/18 0847  . oxyCODONE (Oxy IR/ROXICODONE) immediate release tablet 5-10 mg  5-10 mg Oral Q4H PRN Sela Hilding, MD   5 mg at 06/23/18 2042  . polyethylene glycol (MIRALAX / GLYCOLAX) packet 17 g  17 g Oral Daily Meccariello, Bailey J, DO   17 g at 06/25/18 1045  . Rivaroxaban (XARELTO) tablet 15 mg  15 mg Oral BID WC Sela Hilding, MD   15 mg at 06/25/18 0849  . sodium chloride flush (NS) 0.9 % injection 3 mL  3 mL Intravenous Q12H Sela Hilding, MD   3 mL at 06/25/18 7867     Discharge Medications: Please see discharge summary for a list of discharge medications.  Relevant Imaging Results:  Relevant Lab Results:   Additional Information SS#: [id number]  Eileen Stanford, LCSW

## 2018-06-25 NOTE — Discharge Summary (Addendum)
CALL PAGER [phone] for any questions or notifications regarding this patient   FMTS Attending Daily Note: Diamond Singh, MD  Pager [phone]  Office [phone] I have seen and examined this patient, reviewed their chart. I have discussed this patient with the resident. I agree with the resident's findings, assessment and care plan.  Pulmonary Embolism: Will continue Xarelto.  She should take 15 mg twice daily for duration of 21 days followed by 20 mg nightly.  Is very important that patient take this with dinner or a meal as it is absorbed with that.  She should continue this for 3 months.  This was a provoked event.  Discontinue aspirin and restart Plavix given favorable risk profile of these 2 together.   Chronic Anemia: Repeat CBC weekly   [facility] Discharge Summary  [name] Medical record number: [id number] Date of birth: [birth year] Age: 47 y.o. Gender: female Date of Admission: 06/22/2018  Date of Discharge: 06/25/2018 Admitting Physician: Diamond Malay, MD  Primary Care Provider: [name], MD Consultants: None  Indication for Hospitalization: Provoked PE  Discharge Diagnoses/Problem List:  Shortness of Breath, Chest Pain  N2TF with complication HTN H/o CVA Microcytic Anemia  Disposition: Back to [facility]  Discharge Condition:  stable  Discharge Exam:   General: 47 y.o. female in NAD Cardio: RRR, 4/6 holosystolic murmur Lungs: CTAB, no wheezing, no rhonchi, no crackles Abdomen: Soft, non-tender to palpation, positive bowel sounds Skin: warm and dry Extremities: 3-5 digit amputation on right foot with dry bandage  Brief Hospital Course:   Diamond Rice a 47 y.o.femalepresenting with PE. PMH is significant for T2DM, COPD, MI, CVA, mood disorderwith residualleft-sided weakness.  Her hospital course is outlined below.  Pulmonary Embolism She was found to have acute right lower lobe pulmonary  embolism confirmed on CTA following elevated d-dimer of 4.31 on admission.  This was a provoked event as she had undergone a left 3-5 digit toe amputation on 10/1 with only physical activity being PT, otherwise she does not walk.  She was started on a heparin drip in the ED and was transitioned to Xarelto 15 mg twice daily and aspirin.  Following review of literature and discussion with on-call vascular surgeon, decision was made to change aspirin to Plavix as this has a decrease bleeding risk and better overall outcomes.  She should continue on Xarelto and aspirin for 3 months for the treatment of a provoked PE.  At the time of discharge she was without chest pain, shortness of breath.  Her O2 sats were appropriate on room air and vital signs were stable.  Left ventricular hypertrophy Noted on echo.  Differential includes hypertension as well as sarcoidosis.  EF preserved at 65 to 70% with grade 1 diastolic dysfunction.  Recommend cards referral as an outpatient for work-up of this new left ventricular hypertrophy.  Left foot wound Patient's wound was continued to be cared for by wound care during her hospitalization.  She was without left foot pain.  She should continue to receive wound care at [facility].  Microcytic anemia Patient's hemoglobin on admission 10.2, decreased to around 8 during admission and then remained stable at 8.  Her baseline appears to be about 8.3 in the past.  Her transfusion threshold to be 7.  Her most recent transfusion was on 10/3 most likely the reason for her elevated hemoglobin on admission.  She has had a previous admission in August for hematochezia requiring this transfusion.  During  this admission she received a colonoscopy that noted several ulcers as well as polyps.  She currently denies any rectal or vaginal bleeding.  Her vital signs remained stable throughout her hospitalization.  She should continue to receive weekly CBCs at her facility to continue to ensure that she  does not require blood transfusion.  She should also follow-up with GI as an outpatient for possible EGD.  If EGD negative may benefit from hemoglobin electrophoresis and further work-up of this microcytic anemia.  Issues for Follow Up:  1. Continue Xarelto and Plavix for 3 months for provoked PE treatment. 2. Will need outpatient cardiology follow up for new LVH on Echo, differential includes HTN and amyloidosis.  Referral placed on discharge. 3. Will need continued wound care of Left foot wound at SNF. 4. Recommend weekly CBCs at [facility] for microcytic anemia.  She has history of hematochezia with requiring transfusion in August 2019 with colonoscopy revealing several ulcers and polyps.  Referral placed for GI follow up.  Significant Procedures: Echo  Significant Labs and Imaging:  Recent Labs  Lab 06/23/18 0303 06/24/18 0333 06/25/18 0305  WBC 10.6* 8.8 8.2  HGB 8.0* 7.6* 8.0*  HCT 25.8* 25.0* 25.9*  PLT 569* 511* 530*   Recent Labs  Lab 06/22/18 1230 06/22/18 1244 06/23/18 0303 06/24/18 0333 06/25/18 0305  NA 137 138 134* 135 136  K 3.4* 3.5 3.4* 3.3* 3.5  CL 103 101 104 104 103  CO2 25  --  24 26 25   GLUCOSE 195* 192* 173* 165* 165*  BUN 7 7 8 8 12   CREATININE 1.18* 1.10* 1.10* 1.12* 1.19*  CALCIUM 9.4  --  9.2 9.0 9.4  ALKPHOS 72  --   --   --   --   AST 14*  --   --   --   --   ALT 9  --   --   --   --   ALBUMIN 3.3*  --   --   --   --     Echo 10/28 EF 65-70% Severe LVH G1DD Mild to moderate aortic regurg Trivial pericardial effusion  Dg Chest 2 View  Result Date: 06/22/2018 CLINICAL DATA:  Shortness of breath. EXAM: CHEST - 2 VIEW COMPARISON:  04/09/2018 FINDINGS: The heart is mildly enlarged but stable. The mediastinal and hilar contours are within normal limits and stable. Streaky subsegmental atelectasis but no infiltrates, effusions or edema. The bony thorax is intact. IMPRESSION: Streaky basilar atelectasis.  No infiltrates or effusions.  Electronically Signed   By: Marijo Sanes M.D.   On: 06/22/2018 13:15   Ct Angio Chest Pe W/cm &/or Wo Cm  Result Date: 06/22/2018 CLINICAL DATA:  Shortness of breath. Evaluate for pulmonary embolus. EXAM: CT ANGIOGRAPHY CHEST WITH CONTRAST TECHNIQUE: Multidetector CT imaging of the chest was performed using the standard protocol during bolus administration of intravenous contrast. Multiplanar CT image reconstructions and MIPs were obtained to evaluate the vascular anatomy. CONTRAST:  60 ml ISOVUE-370 IOPAMIDOL (ISOVUE-370) INJECTION 76% COMPARISON:  Chest x-ray on 06/22/2018 FINDINGS: Cardiovascular: Heart is mildly enlarged. There is no pericardial effusion. There are punctate calcifications within the coronary arteries. There are focal areas of calcification within the thoracic aorta, not associated with aneurysm. Incidental note is made of bovine arch anatomy. The pulmonary arteries are well opacified by contrast bolus. Within the RIGHT LOWER lobe pulmonary artery branches, there is a small focal embolus, best seen on image 188/10. RV: LV ratio is 0.9. Mediastinum/Nodes: The esophagus is  patulous and contains an air-fluid level. There is circumferential thickening of the LOWER esophagus. Lungs/Pleura: There are no focal consolidations or pleural effusions. There focal areas of ground-glass opacity within the posterior LOWER lobes bilaterally. Upper Abdomen: No acute abnormality. Musculoskeletal: There are significant osteophytes of the midthoracic spine. No suspicious lytic or blastic lesions are identified. Review of the MIP images confirms the above findings. IMPRESSION: 1. RIGHT LOWER lobe pulmonary embolus. No evidence for RIGHT heart strain. 2.  Aortic atherosclerosis.  (ICD10-I70.0) bovine arch anatomy. 3. Mild cardiomegaly. 4. Patulous, thick-walled esophagus with air-fluid level. The findings are abnormal. Considerations include esophagitis, gastroesophageal reflux, scleroderma. 5. Nonspecific  ground-glass opacities in the LOWER lobes bilaterally, consistent with mild edema, infarct, or pneumonitis. These results were called by telephone at the time of interpretation on 06/22/2018 at 4:46 pm to Dr. Wyn Quaker , who verbally acknowledged these results. Electronically Signed   By: Nolon Nations M.D.   On: 06/22/2018 16:49   Results/Tests Pending at Time of Discharge: None  Discharge Medications:  Allergies as of 06/25/2018      Reactions   Morphine And Related Hives   Metformin Diarrhea      Medication List    STOP taking these medications   aspirin 81 MG EC tablet     TAKE these medications   alum & mag hydroxide-simeth 200-200-20 MG/5ML suspension Commonly known as:  MAALOX/MYLANTA Take 30 mLs by mouth every 2 (two) hours. For 24 hrs   ARIPiprazole 15 MG tablet Commonly known as:  ABILIFY Take 1 tablet (15 mg total) by mouth at bedtime.   cloNIDine 0.3 MG tablet Commonly known as:  CATAPRES Take 1 tablet (0.3 mg total) by mouth 3 (three) times daily.   clopidogrel 75 MG tablet Commonly known as:  PLAVIX Take 1 tablet (75 mg total) by mouth daily.   ferrous sulfate 325 (65 FE) MG tablet Take 325 mg by mouth 2 (two) times daily with a meal.   furosemide 40 MG tablet Commonly known as:  LASIX Take 40 mg by mouth daily.   gabapentin 100 MG capsule Commonly known as:  NEURONTIN Take 100 mg by mouth 3 (three) times daily.   hydrALAZINE 50 MG tablet Commonly known as:  APRESOLINE Take 1 tablet (50 mg total) by mouth every 8 (eight) hours.   Insulin Detemir 100 UNIT/ML Pen Commonly known as:  LEVEMIR Inject 15 Units into the skin 2 (two) times daily.   lisinopril 30 MG tablet Commonly known as:  PRINIVIL,ZESTRIL Take 1 tablet (30 mg total) by mouth daily. Start taking on:  06/26/2018 What changed:    medication strength  how much to take   Melatonin 3 MG Tabs Take 3 mg by mouth at bedtime.   oxyCODONE 5 MG immediate release  tablet Commonly known as:  Oxy IR/ROXICODONE Take 1-2 tablets (5-10 mg total) by mouth every 4 (four) hours as needed for moderate pain. What changed:  Another medication with the same name was removed. Continue taking this medication, and follow the directions you see here.   pentoxifylline 400 MG CR tablet Commonly known as:  TRENTAL Take 400 mg by mouth 3 (three) times daily with meals.   polyethylene glycol packet Commonly known as:  MIRALAX / GLYCOLAX Take 17 g by mouth daily.   potassium chloride 10 MEQ tablet Commonly known as:  K-DUR Take 2 tablets (20 mEq total) by mouth daily.   Rivaroxaban 15 & 20 MG Tbpk Take as directed on package: Start with  one 15mg  tablet by mouth twice a day with food. On Day 22, switch to one 20mg  tablet once a day with food.   sennosides-docusate sodium 8.6-50 MG tablet Commonly known as:  SENOKOT-S Take 2 tablets by mouth 2 (two) times daily.       Discharge Instructions: Please refer to Patient Instructions section of EMR for full details.  Patient was counseled important signs and symptoms that should prompt return to medical care, changes in medications, dietary instructions, activity restrictions, and follow up appointments.   Follow-Up Appointments:  Contact information for follow-up providers    Diamond Hose, MD. Call.   Specialty:  Internal Medicine Why:  to schedule appointment in 1 week of discharge for hospital follow up Contact information: [place] [address] [phone]            Contact information for after-discharge care    [facility] .   Service:  Skilled Nursing Contact information: [address] Talty Wagner, DO

## 2018-06-25 NOTE — Progress Notes (Addendum)
Family Medicine Teaching Service Daily Progress Note Intern Pager: [phone]  [name] Medical record number: [id number] Date of birth: 1970-09-09 Age: 47 y.o. Gender: female  Primary Care Provider: [name], MD Consultants: none Code Status: FULL  Pt Overview and Major Events to Date:  Admit 06/22/18  Assessment and Plan: Diamond Rice is a 47 y.o. female presenting with PE . PMH is significant for T2DM, COPD, MI, CVA, mood disorder with residual left-sided weakness.  Shortness of Breath, Chest Pain Provoked following left foot 3-5 digit amputation and lack of PT.  Currently on xarelto and ASA since 10/26.  Echo EF 65-70%, severe LVH, with G1DD, mild to mod aortic regurg. Suggestive of possible infiltrative process. Stable on RA overnight.  No CP or SOB today. Spoke with Dr. Trula Slade who said ASA or plavix with xarelto is acceptable. - monitor on tele - will change to xarelto and plavix given decreased bleeding risk and better overall outcomes - continuous pulse ox - holding home trental at present - d/c ASA - cards referral on d/c for workup of possible infiltrative process noted on Echo - will d/c to [facility] today   O9GE with complication- patient states she takes 15 levemir in morning and at night. She takes gabapentin 100mg  TID. S/P 3-5 digit on left foot amputated 10/01 with wound over area, now being managed by wound care. A1c on admission 6.8. Blood glucose this AM 165. - 8 units lantus BID - continue gabapentin - CBGs - moderate SSI - carb modified diet - wound care consulted for left foot wound  HTN- Patient takes clonidine 0.3 mg TID, lasix 40mg  daily, lisinopril 30mg  daily, hydralazine 50mg  TID. Diagnosis of OSA on problem list, CPAP compliance may play a role in HTN.  Home lasix restarted on 10/28.  No UOP recorded yesterday, patient notes that she did have .  BP this AM 133/89. - patient using CPAP at home and at nursing home   - continue home meds - restart home lisinopril - cont Lasix 40mg  PO QD  H/o CVA- 2016 on plavix 75mg  daily and asa 81 home meds. - continue home ASA - hold plavix   Microcytic Anemia- Baseline 8.3 in past.  Hgb 10.2>8.0>7.6>8.0. Transfusion threshold is 7.0. Patient with transfusion on 10/3, which may be increasing iron levels. Previous admission with August with hematochezia requiring transfusion.  Colonoscopy at the time noted several ulcers and polyps.  Currently denies rectal and vaginal bleeding.  VSS. -monitor cbc  - recommend outpatient GI consult with possible EGD and if negative consider hematology referral as outpatient - recommend weekly CBCs at facility   Constipation- patient takes miralax, senokot at home. Likely exacerbated by patient's other home meds of oxycodone and iron - continue miralax and senokot  FEN/GI: heart healthy, carb modified Prophylaxis: xarelto   Disposition: likely back to [facility] today  Subjective:  Patient feeling well this AM.  Denies CP or SOB.  Reports UOP yesterday.  Does note that she has not had BM since Friday.  Objective: Temp:  [97.8 F (36.6 C)-98.1 F (36.7 C)] 98.1 F (36.7 C) (10/29 0735) Pulse Rate:  [67-71] 71 (10/29 0735) Resp:  [20] 20 (10/29 0011) BP: (115-133)/(89-92) 133/89 (10/29 0735) SpO2:  [100 %] 100 % (10/29 0735)  Physical Exam:  General: 47 y.o. female in NAD Cardio: RRR, 4/6 holosystolic murmur Lungs: CTAB, no wheezing, no rhonchi, no crackles Abdomen: Soft, non-tender to palpation, positive bowel sounds Skin: warm and dry Extremities:  3-5 digit amputation on right foot with dry bandage    Laboratory: Recent Labs  Lab 06/23/18 0303 06/24/18 0333 06/25/18 0305  WBC 10.6* 8.8 8.2  HGB 8.0* 7.6* 8.0*  HCT 25.8* 25.0* 25.9*  PLT 569* 511* 530*   Recent Labs  Lab 06/22/18 1230  06/23/18 0303 06/24/18 0333 06/25/18 0305  NA 137   < > 134* 135 136  K 3.4*   < > 3.4* 3.3* 3.5  CL 103   < >  104 104 103  CO2 25  --  24 26 25   BUN 7   < > 8 8 12   CREATININE 1.18*   < > 1.10* 1.12* 1.19*  CALCIUM 9.4  --  9.2 9.0 9.4  PROT 7.5  --   --   --   --   BILITOT 0.7  --   --   --   --   ALKPHOS 72  --   --   --   --   ALT 9  --   --   --   --   AST 14*  --   --   --   --   GLUCOSE 195*   < > 173* 165* 165*   < > = values in this interval not displayed.    Imaging/Diagnostic Tests: No results found.  Meccariello, Bernita Raisin, DO 06/25/2018, 9:38 AM PGY-1, [place] Intern pager: [phone], text pages welcome

## 2018-06-26 ENCOUNTER — Encounter: Payer: Self-pay | Admitting: Internal Medicine

## 2018-06-26 ENCOUNTER — Non-Acute Institutional Stay (SKILLED_NURSING_FACILITY): Payer: Medicare Other | Admitting: Internal Medicine

## 2018-06-26 DIAGNOSIS — Z9189 Other specified personal risk factors, not elsewhere classified: Secondary | ICD-10-CM

## 2018-06-26 DIAGNOSIS — D509 Iron deficiency anemia, unspecified: Secondary | ICD-10-CM

## 2018-06-26 DIAGNOSIS — I2699 Other pulmonary embolism without acute cor pulmonale: Secondary | ICD-10-CM | POA: Diagnosis not present

## 2018-06-26 DIAGNOSIS — I10 Essential (primary) hypertension: Secondary | ICD-10-CM | POA: Diagnosis not present

## 2018-06-26 DIAGNOSIS — E1151 Type 2 diabetes mellitus with diabetic peripheral angiopathy without gangrene: Secondary | ICD-10-CM | POA: Diagnosis not present

## 2018-06-26 DIAGNOSIS — K625 Hemorrhage of anus and rectum: Secondary | ICD-10-CM

## 2018-06-26 NOTE — Patient Instructions (Signed)
See assessment and plan under each diagnosis in the problem list and acutely for this visit 

## 2018-06-26 NOTE — Assessment & Plan Note (Addendum)
Current A1c indicates adequate overall control with value of 6.8% on 06/22/2018 Glucoses at SNF ranged from a low of 183 fasting to 263 fasting

## 2018-06-26 NOTE — Progress Notes (Signed)
NURSING HOME LOCATION:  [facility] ROOM NUMBER:  [id number]  CODE STATUS:  Full Code  PCP:  [name], MD  [place] [id number]  This is a [facility] readmission within 30 days.  Interim medical record and care since last [place] visit was updated with review of diagnostic studies and change in clinical status since last visit were documented.  HPI: The patient was readmitted 10/26-10/29/2019 for chest pain and shortness of breath. D-dimer was found to be 4.31 on admission.  CTA showed a small PTE as well as a dilated esophagus. Pulmonary embolism was felt to be  a provoked event as she had undergone a left 3-5 digit toe amputation on 10/1 with sedentary status postop with only physical activity being physical therapy. Heparin drip was initiated in the ED with subsequent transition to Xarelto. With initiation of the Xarelto, aspirin was discontinued and Plavix continued based on potential bleeding risk.  Xarelto and aspirin were to be continued for 3 months with reevaluation for continued anticoagulation at that time. Echocardiogram revealed left ventricular hypertrophy with ejection fraction 65-70% with grade 1 diastolic dysfunction.  Cardiology referral as an outpatient for evaluation of new left ventricular hypertrophy was recommended. On admission hemoglobin was 10.2 and this decreased to 8 and remained stable.  She had received a transfusion in August of this year for hematochezia.  At that time colonoscopy revealed several ulcers as well as polyps. It was recommended that EGD be considered should weekly CBCs indicate progressive blood loss.  If that EGD were negative consideration was recommended for hemoglobin electrophoresis. A1c was 6.8% on 06/22/2018.  Glucoses here at the facility range from a low of 183 up to a high of 263 fasting.  She is on Detemir 15 units twice a day  Review of systems: She complains of some insomnia  despite low dose Melatonin.  She does have obstructive sleep apnea but states she is been compliant with her CPAP.  She continues to have some shortness of breath.  She states that she is minimizing pain medicines because it causes constipation.  She states she is thirsty but denies excessive urination or excessive hunger.  Constitutional: No fever, significant weight change, fatigue  Eyes: No redness, discharge, pain, vision change ENT/mouth: No nasal congestion,  purulent discharge, earache, change in hearing, sore throat  Cardiovascular: No chest pain, palpitations, paroxysmal nocturnal dyspnea, claudication, edema  Respiratory: No cough, sputum production, hemoptysis Gastrointestinal: No heartburn, dysphagia, abdominal pain, nausea /vomiting, rectal bleeding, melena Genitourinary: No dysuria, hematuria, pyuria, incontinence, nocturia Musculoskeletal: No joint stiffness, joint swelling, weakness, pain Dermatologic: No rash, pruritus, change in appearance of skin Neurologic: No dizziness, headache, syncope, seizures, numbness, tingling Psychiatric: No significant anxiety, depression, insomnia, anorexia Endocrine: No change in hair/skin/nails Hematologic/lymphatic: No significant bruising, lymphadenopathy, abnormal bleeding Allergy/immunology: No itchy/watery eyes, significant sneezing, urticaria, angioedema  Physical exam:  Pertinent or positive findings: She is somewhat lethargic but is oriented x3.  Grade 1 systolic murmur is present at the base.  Pedal pulses are decreased in the right lower extremity.  The dorsalis pedis pulses are decreased greater than the posterior tibial pulses.  The left foot is dressed.  She has irregular hyperpigmented scarring over the shins.  She is slightly weaker in the left upper extremity than the right upper extremity.  The legs are weaker than the upper extremities.  General appearance: Adequately nourished; no acute distress, increased work of breathing is  present.  Lymphatic: No lymphadenopathy about the head, neck, axilla. Eyes: No conjunctival inflammation or lid edema is present. There is no scleral icterus. Ears:  External ear exam shows no significant lesions or deformities.   Nose:  External nasal examination shows no deformity or inflammation. Nasal mucosa are pink and moist without lesions, exudates Oral exam:  Lips and gums are healthy appearing. There is no oropharyngeal erythema or exudate. Neck:  No thyromegaly, masses, tenderness noted.    Heart:  Normal rate and regular rhythm. S1 and S2 normal without gallop, click, rub .  Lungs:  without wheezes, rhonchi, rales, rubs. Abdomen: Bowel sounds are normal. Abdomen is soft and nontender with no organomegaly, hernias, masses. GU: Deferred  Extremities:  No cyanosis, clubbing, edema  Neurologic exam : Balance, Rhomberg, finger to nose testing could not be completed due to clinical state Deep tendon reflexes are equal Skin: Warm & dry w/o tenting. No significant lesions or rash.  See summary under each active problem in the Problem List with associated updated therapeutic plan

## 2018-06-26 NOTE — Assessment & Plan Note (Signed)
Weekly CBCs at the SNF with EGD if anemia is progressive

## 2018-06-26 NOTE — Assessment & Plan Note (Addendum)
She has minimized opiates due to constipation Opioid deprescribing is clinically mandated as per standard of care due to mortality risk & co-morbidities

## 2018-06-26 NOTE — Assessment & Plan Note (Signed)
10/26-10/29/2019 predischarge hemoglobin 8/hematocrit 25.9 with microcytic, normochromic indices and normal iron and ferritin levels Weekly CBCs recommended with EGD if progressive anemia Were EGD performed and negative, hemoglobin electrophoresis was recommended

## 2018-06-28 ENCOUNTER — Encounter: Payer: Self-pay | Admitting: Family

## 2018-06-28 ENCOUNTER — Other Ambulatory Visit: Payer: Self-pay

## 2018-06-28 ENCOUNTER — Encounter: Payer: Self-pay | Admitting: Vascular Surgery

## 2018-06-28 ENCOUNTER — Ambulatory Visit (INDEPENDENT_AMBULATORY_CARE_PROVIDER_SITE_OTHER): Payer: Self-pay | Admitting: Family

## 2018-06-28 VITALS — BP 155/103 | HR 80 | Temp 98.2°F | Resp 16 | Ht 61.0 in | Wt 184.0 lb

## 2018-06-28 DIAGNOSIS — I739 Peripheral vascular disease, unspecified: Secondary | ICD-10-CM

## 2018-06-28 DIAGNOSIS — Z9862 Peripheral vascular angioplasty status: Secondary | ICD-10-CM

## 2018-06-28 DIAGNOSIS — S98132A Complete traumatic amputation of one left lesser toe, initial encounter: Secondary | ICD-10-CM

## 2018-06-28 NOTE — Progress Notes (Signed)
Vitals:   06/28/18 1208  BP: (!) 164/105  Pulse: 80  Resp: 16  Temp: 98.2 F (36.8 C)  TempSrc: Oral  SpO2: 100%  Weight: 184 lb (83.5 kg)  Height: 5\' 1"  (1.549 m)

## 2018-06-28 NOTE — Progress Notes (Signed)
[facility] [place]   CC: Follow up peripheral artery occlusive disease  History of Present Illness Diamond Rice is a 47 y.o. female  follows up for evaluation of left foot wound.  She is s/p balloon angioplasty of left anterior tibial artery with 2.5 mm balloon on 05-27-18 and amputation of toes 3 through 5 on 05-28-18 by Dr. Donzetta Matters.  She has a wound at the toes amputation site, that was debrided on 06-06-18.   Dr. Donzetta Matters last evaluated pt on 06-21-18. At that time pt had a palpable pulse with great blood flow to her foot and wound.  Wound was debrided in the office that day back to healthy appearing tissue although the medial aspect still had some concern.  Pt was to follow-up in 1 week in the office for wound check.  If there is further concern for wound issues she can be booked for me to debride the wound versus formal transmetatarsal amputation in the operating room the following week.  Pt is a resident of [facility] nursing facility. She states she is in the NH since she was discharged from [facility] on [facility]-18, admitted through the ED on 06-22-18 for PE, is taking Xarelto. She is receiving physical therapy and daily dressing changes to her left foot wound. She denies fever or chills.  She denies any problems with her right foot.   She states she had a stroke in 2016, she continues to have left side weakness.    Diabetic: Yes, A1C was 6.8 on 06-22-18. Tobacco use: former smoker, quit in October 2019,started in her teens  Pt meds include: Statin :No Betablocker: Yes ASA: No Other anticoagulants/antiplatelets: Xarelto, Plavix  Past Medical History:  Diagnosis Date  . Anginal pain ([place])   . Anxiety   . Chronic lower back pain 04/17/2012   "just got over some; catched when I walked"  . COPD (chronic obstructive pulmonary disease) ([place])   . Critical lower limb ischemia 05/27/2018  . Depression   . Headache(784.0) 04/17/2012   "~ qod; lately waking up in am w/one"    . Heart attack ([place])   . High cholesterol   . Hypertension   . Migraines 04/17/2012  . Obesity   . Shortness of breath 04/17/2012   "all the time"  . Sleep apnea    scheduled to get her CPAP machine this week  . Stroke ([place])   . Type II diabetes mellitus ([place]) 08/28/2002    Social History Social History   Tobacco Use  . Smoking status: Former Smoker    Packs/day: 0.25    Years: 28.00    Pack years: 7.00    Types: Cigarettes  . Smokeless tobacco: Never Used  Substance Use Topics  . Alcohol use: No    Alcohol/week: 0.0 standard drinks    Comment: rare  . Drug use: Yes    Types: Marijuana, "Crack" cocaine    Comment: marijuana last use days ago; last cocaine several months ago    Family History Family History  Problem Relation Age of Onset  . Stroke Brother 57  . Stroke Brother 66  . Heart attack Mother 69  . Stroke Father 13    Past Surgical History:  Procedure Laterality Date  . ABDOMINAL AORTOGRAM W/LOWER EXTREMITY Left 05/27/2018   Procedure: ABDOMINAL AORTOGRAM W/LOWER EXTREMITY Runoff and Possible Intervention;  Surgeon: Waynetta Sandy, MD;  Location: [place] [facility];  Service: Cardiovascular;  Laterality: Left;  . APPLICATION OF WOUND VAC Left 06/06/2018  Procedure: APPLICATION OF WOUND VAC;  Surgeon: Waynetta Sandy, MD;  Location: [employer];  Service: Vascular;  Laterality: Left;  . BIOPSY  04/04/2018   Procedure: BIOPSY;  Surgeon: [name], MD;  Location: [facility] ENDOSCOPY;  Service: Endoscopy;;  . CARDIAC CATHETERIZATION  ~ 2011  . COLONOSCOPY N/A 04/04/2018   Procedure: COLONOSCOPY;  Surgeon: [name], MD;  Location: [facility] ENDOSCOPY;  Service: Endoscopy;  Laterality: N/A;  . LOWER EXTREMITY ANGIOGRAM Left 05/27/2018  . PERIPHERAL VASCULAR INTERVENTION Left 05/27/2018   Procedure: PERIPHERAL VASCULAR INTERVENTION;  Surgeon: Waynetta Sandy, MD;  Location: [place] [facility];  Service: Cardiovascular;  Laterality: Left;  .  POLYPECTOMY  04/04/2018   Procedure: POLYPECTOMY;  Surgeon: [name], MD;  Location: [facility] ENDOSCOPY;  Service: Endoscopy;;  . TRANSMETATARSAL AMPUTATION Left 05/28/2018   Procedure: AMPUTATION TOES THREE, FOUR AND FIVE on left foot;  Surgeon: Waynetta Sandy, MD;  Location: [facility];  Service: Vascular;  Laterality: Left;  . WOUND DEBRIDEMENT Left 06/06/2018   Procedure: DEBRIDEMENT WOUND LEFT FOOT;  Surgeon: Waynetta Sandy, MD;  Location: [place];  Service: Vascular;  Laterality: Left;    Allergies  Allergen Reactions  . Morphine And Related Hives  . Metformin Diarrhea    Current Outpatient Medications  Medication Sig Dispense Refill  . alum & mag hydroxide-simeth (MAALOX/MYLANTA) 200-200-20 MG/5ML suspension Take 30 mLs by mouth every 2 (two) hours. For 24 hrs    . ARIPiprazole (ABILIFY) 15 MG tablet Take 1 tablet (15 mg total) by mouth at bedtime. 30 tablet 0  . cloNIDine (CATAPRES) 0.3 MG tablet Take 1 tablet (0.3 mg total) by mouth 3 (three) times daily. 90 tablet 0  . clopidogrel (PLAVIX) 75 MG tablet Take 1 tablet (75 mg total) by mouth daily. 30 tablet 3  . ferrous sulfate 325 (65 FE) MG tablet Take 325 mg by mouth 2 (two) times daily with a meal.    . furosemide (LASIX) 40 MG tablet Take 40 mg by mouth daily.    Marland Kitchen gabapentin (NEURONTIN) 100 MG capsule Take 100 mg by mouth 3 (three) times daily.    . hydrALAZINE (APRESOLINE) 50 MG tablet Take 1 tablet (50 mg total) by mouth every 8 (eight) hours. 90 tablet 0  . Insulin Detemir (LEVEMIR) 100 UNIT/ML Pen Inject 15 Units into the skin 2 (two) times daily.     Marland Kitchen lisinopril (PRINIVIL,ZESTRIL) 30 MG tablet Take 1 tablet (30 mg total) by mouth daily. 30 tablet 0  . Melatonin 3 MG TABS Take 3 mg by mouth at bedtime.     . metoprolol tartrate (LOPRESSOR) 25 MG tablet Take 25 mg by mouth 2 (two) times daily.    . pentoxifylline (TRENTAL) 400 MG CR tablet Take 400 mg by mouth 3 (three) times daily with meals.     .  polyethylene glycol (MIRALAX / GLYCOLAX) packet Take 17 g by mouth daily.    . potassium chloride (K-DUR) 10 MEQ tablet Take 2 tablets (20 mEq total) by mouth daily. 14 tablet 0  . Rivaroxaban 15 & 20 MG TBPK Take as directed on package: Start with one 15mg  tablet by mouth twice a day with food. On Day 22, switch to one 20mg  tablet once a day with food. 51 each 0  . sennosides-docusate sodium (SENOKOT-S) 8.6-50 MG tablet Take 2 tablets by mouth 2 (two) times daily.    Marland Kitchen oxycodone (OXY-IR) 5 MG capsule Take 5 mg by mouth every 6 (six) hours as needed for  pain.     No current facility-administered medications for this visit.     ROS: See HPI for pertinent positives and negatives.   Physical Examination  Vitals:   06/28/18 1208 06/28/18 1212  BP: (!) 164/105 (!) 155/103  Pulse: 80 80  Resp: 16   Temp: 98.2 F (36.8 C)   TempSrc: Oral   SpO2: 100%   Weight: 184 lb (83.5 kg)   Height: 5\' 1"  (1.549 m)    Body mass index is 34.77 kg/m.  General: A&O x 3, WDWN, obese female. Gait: seated in her w/c HENT: No gross abnormalities.  Eyes: PERRLA. Pulmonary: Respirations are non labored, CTAB, fair air movement in all fields Cardiac: regular rhythm, no detected murmur.         Carotid Bruits Right Left   Negative Negative   Radial pulses are faintly palpable bilaterally   Adominal aortic pulse is not palpable                         VASCULAR EXAM: Extremities without ischemic changes, without Gangrene; with open wound at left 2-5 toes amputation site.  Surgically absent left 2-5th toes. See photo below     Left foot                                                                                                           LE Pulses Right Left       FEMORAL  not palpable, seated, obese  not palpable        POPLITEAL  not palpable   not palpable       POSTERIOR TIBIAL  not palpable   not palpable        DORSALIS PEDIS      ANTERIOR TIBIAL not palpable  2+ palpable     Abdomen: soft, NT, no palpable masses. Skin: no rashes, see Extremities Musculoskeletal: no muscle wasting or atrophy.  Neurologic: A&O X 3; appropriate affect, Sensation is normal; MOTOR FUNCTION:  moving all extremities equally, motor strength 4/5 throughout. Speech is fluent/normal slightly sluured. CN 2-12 intact except left corner or mouth is lower than right with smiling. Psychiatric: Thought content is normal, mood appropriate for clinical situation.     ASSESSMENT: Diamond Rice is a 47 y.o. female who is s/p balloon angioplasty of left anterior tibial artery with 2.5 mm balloon on 05-27-18 and amputation of toes 3 through 5 on 05-28-18 by Dr. Donzetta Matters.  She has a wound at the toes amputation site, that was debrided on 06-06-18.   She has a wound at the toes amputation site.   I spoke with Dr. Donnetta Hutching. Pt's left foot wound seems to be slowly healing, signs of contracting at wound edges. No fibrinous exudate, minimal dark tissue at medial edge of wound. It does not appear that pt needs a debridement of this wound. She has a 2+ palpable left DP pulse. Will advise NH to continue damp to dry daily NS dressing changes to the left foot wound. See Dr. Donzetta Matters  in 1 week for wound evaluation.   PLAN:   NH to continue damp to dry daily NS dressing changes to the left foot wound. See Dr. Donzetta Matters in 1 week for wound evaluation.  I discussed in depth with the patient the nature of atherosclerosis, and emphasized the importance of maximal medical management including strict control of blood pressure, blood glucose, and lipid levels, obtaining regular exercise, and continued cessation of smoking.  The patient is aware that without maximal medical management the underlying atherosclerotic disease process will progress, limiting the benefit of any interventions.  The patient was given information about PAD including signs, symptoms, treatment, what symptoms should prompt the patient to seek immediate medical  care, and risk reduction measures to take.  Clemon Chambers, RN, MSN, FNP-C [facility] Phone: [phone]  Clinic MD: [facility] on call  06/28/18 12:22 PM

## 2018-06-28 NOTE — Assessment & Plan Note (Signed)
Last BP outlier; BP monitor @ SNF to assess need to increase BP meds

## 2018-07-03 NOTE — Progress Notes (Signed)
Cardiology Office Note   Date:  07/04/2018   ID:  [name], DOB 1971/05/18, MRN [id number]  PCP:  [name], MD  Cardiologist:   No primary care provider on file. Referring:  Hendricks Limes, MD   Chief Complaint  Patient presents with  . LVH      History of Present Illness Diamond Rice is a 47 y.o. female who is referred by Hendricks Limes, MD for evaluation of LVH.  The patient was recently in the hospital with pulmonary embolism following toe amputation.  Her EF is found to be 65 to 70%.  She is noted to have mild to moderate aortic insufficiency.  There was a mention in her notes of having a previous MI but I do not see a record of this.  I do see that she had a previous perfusion study in 2013 without ischemia or infarct.     She reports that there was a heart catheterization some years ago when I see mention in 08/2009.  She said there was some small vessel disease.  Other than that there is not been any significant cardiac history.  She is had difficult to control hypertension with a strong family history of this.  She had a stroke with left-sided weakness.  She was walking with a cane prior to her foot surgery.  She is now at rehab and she is doing walking with a cane and with a walker.  She says she actually feels pretty good with this.  She denies any chest pressure, neck or arm discomfort.  She does not have any palpitations, presyncope or syncope.  She has no shortness of breath, PND or orthopnea.  She is had no weight gain or edema.   Past Medical History:  Diagnosis Date  . Anxiety   . Chronic lower back pain 04/17/2012   "just got over some; catched when I walked"  . COPD (chronic obstructive pulmonary disease) ([place])   . Critical lower limb ischemia 05/27/2018  . Depression   . Headache(784.0) 04/17/2012   "~ qod; lately waking up in am w/one"  . High cholesterol   . Hypertension   . Migraines 04/17/2012  . Obesity   . Sleep apnea    CPAP   . Stroke ([place])   . Type II diabetes mellitus ([place]) 08/28/2002    Past Surgical History:  Procedure Laterality Date  . ABDOMINAL AORTOGRAM W/LOWER EXTREMITY Left 05/27/2018   Procedure: ABDOMINAL AORTOGRAM W/LOWER EXTREMITY Runoff and Possible Intervention;  Surgeon: Waynetta Sandy, MD;  Location: [place] [facility];  Service: Cardiovascular;  Laterality: Left;  . APPLICATION OF WOUND VAC Left 06/06/2018   Procedure: APPLICATION OF WOUND VAC;  Surgeon: Waynetta Sandy, MD;  Location: [place];  Service: Vascular;  Laterality: Left;  . BIOPSY  04/04/2018   Procedure: BIOPSY;  Surgeon: [name], MD;  Location: [facility] ENDOSCOPY;  Service: Endoscopy;;  . CARDIAC CATHETERIZATION  ~ 2011  . COLONOSCOPY N/A 04/04/2018   Procedure: COLONOSCOPY;  Surgeon: [name], MD;  Location: [facility] ENDOSCOPY;  Service: Endoscopy;  Laterality: N/A;  . LOWER EXTREMITY ANGIOGRAM Left 05/27/2018  . PERIPHERAL VASCULAR INTERVENTION Left 05/27/2018   Procedure: PERIPHERAL VASCULAR INTERVENTION;  Surgeon: Waynetta Sandy, MD;  Location: [place] [facility];  Service: Cardiovascular;  Laterality: Left;  . POLYPECTOMY  04/04/2018   Procedure: POLYPECTOMY;  Surgeon: [name], MD;  Location: [facility] ENDOSCOPY;  Service: Endoscopy;;  . TRANSMETATARSAL AMPUTATION Left 05/28/2018  Procedure: AMPUTATION TOES THREE, FOUR AND FIVE on left foot;  Surgeon: Waynetta Sandy, MD;  Location: [place];  Service: Vascular;  Laterality: Left;  . WOUND DEBRIDEMENT Left 06/06/2018   Procedure: DEBRIDEMENT WOUND LEFT FOOT;  Surgeon: Waynetta Sandy, MD;  Location: [place];  Service: Vascular;  Laterality: Left;     Current Outpatient Medications  Medication Sig Dispense Refill  . alum & mag hydroxide-simeth (MAALOX/MYLANTA) 200-200-20 MG/5ML suspension Take 30 mLs by mouth every 2 (two) hours. For 24 hrs    . ARIPiprazole (ABILIFY) 15 MG tablet Take 1 tablet (15 mg total) by mouth at bedtime. 30  tablet 0  . cloNIDine (CATAPRES) 0.3 MG tablet Take 1 tablet (0.3 mg total) by mouth 3 (three) times daily. 90 tablet 0  . clopidogrel (PLAVIX) 75 MG tablet Take 1 tablet (75 mg total) by mouth daily. 30 tablet 3  . ferrous sulfate 325 (65 FE) MG tablet Take 325 mg by mouth 2 (two) times daily with a meal.    . furosemide (LASIX) 40 MG tablet Take 40 mg by mouth daily.    Marland Kitchen gabapentin (NEURONTIN) 100 MG capsule Take 100 mg by mouth 3 (three) times daily.    . hydrALAZINE (APRESOLINE) 50 MG tablet Take 1 tablet (50 mg total) by mouth every 8 (eight) hours. 90 tablet 0  . Insulin Detemir (LEVEMIR) 100 UNIT/ML Pen Inject 15 Units into the skin 2 (two) times daily.     Marland Kitchen lisinopril (PRINIVIL,ZESTRIL) 30 MG tablet Take 1 tablet (30 mg total) by mouth daily. 30 tablet 0  . Melatonin 3 MG TABS Take 3 mg by mouth at bedtime.     . metoprolol tartrate (LOPRESSOR) 25 MG tablet Take 25 mg by mouth 2 (two) times daily.    Marland Kitchen oxycodone (OXY-IR) 5 MG capsule Take 5 mg by mouth every 6 (six) hours as needed for pain.    . pentoxifylline (TRENTAL) 400 MG CR tablet Take 400 mg by mouth 3 (three) times daily with meals.     . polyethylene glycol (MIRALAX / GLYCOLAX) packet Take 17 g by mouth daily.    . potassium chloride (K-DUR) 10 MEQ tablet Take 2 tablets (20 mEq total) by mouth daily. 14 tablet 0  . Rivaroxaban 15 & 20 MG TBPK Take as directed on package: Start with one 15mg  tablet by mouth twice a day with food. On Day 22, switch to one 20mg  tablet once a day with food. 51 each 0  . sennosides-docusate sodium (SENOKOT-S) 8.6-50 MG tablet Take 2 tablets by mouth 2 (two) times daily.     No current facility-administered medications for this visit.     Allergies:   Morphine and related and Metformin    Social History:  The patient  reports that she has quit smoking. Her smoking use included cigarettes. She has a 7.00 pack-year smoking history. She has never used smokeless tobacco. She reports that she has  current or past drug history. Drugs: Marijuana and "Crack" cocaine. She reports that she does not drink alcohol.   Family History:  The patient's family history includes Heart attack (age of onset: 60) in her mother; Stroke (age of onset: 2) in her brother and brother; Stroke (age of onset: 57) in her father.    ROS:  Please see the history of present illness.   Otherwise, review of systems are positive for none.   All other systems are reviewed and negative.    PHYSICAL EXAM: VS:  BP Marland Kitchen)  130/98 (BP Location: Left Arm, Patient Position: Sitting, Cuff Size: Normal)   Pulse 77   Ht 5\' 1"  (1.549 m)   Wt 185 lb (83.9 kg)   BMI 34.96 kg/m  , BMI Body mass index is 34.96 kg/m. GENERAL: Chronically ill appearing HEENT:  Pupils equal round and reactive, fundi not visualized, oral mucosa unremarkable NECK:  No jugular venous distention at 90 degrees, waveform within normal limits, carotid upstroke brisk and symmetric, no bruits, no thyromegaly LYMPHATICS:  No cervical, inguinal adenopathy LUNGS:  Clear to auscultation bilaterally BACK:  No CVA tenderness CHEST:  Unremarkable HEART:  PMI not displaced or sustained,S1 and S2 within normal limits, no S3, no S4, no clicks, no rubs, 2 out of 6 apical systolic murmur radiating slightly at the aortic outflow tract, no diastolic murmurs ABD:  Flat, positive bowel sounds normal in frequency in pitch, no bruits, no rebound, no guarding, no midline pulsatile mass, no hepatomegaly, no splenomegaly EXT:  2 plus pulses throughout, no edema, no cyanosis no clubbing SKIN:  No rashes no nodules NEURO:  Cranial nerves II through XII grossly intact, motor grossly intact throughout, left sided weakness and facial droop PSYCH:  Cognitively intact, oriented to person place and time    EKG:  EKG is not ordered today. The ekg ordered today demonstrates sinus rhythm, rate 77, left ventricular hypertrophy with repolarization changes slightly more prominent than  previous.   Recent Labs: 04/02/2018: TSH 0.705 05/22/2018: Magnesium 2.0 06/22/2018: ALT 9; B Natriuretic Peptide 96.6 06/25/2018: BUN 12; Creatinine, Ser 1.19; Hemoglobin 8.0; Platelets 530; Potassium 3.5; Sodium 136   Lab Results  Component Value Date   HGBA1C 6.8 (H) 06/22/2018     Lipid Panel    Component Value Date/Time   CHOL 119 04/03/2018 0508   TRIG 90 04/03/2018 0508   HDL 29 (L) 04/03/2018 0508   CHOLHDL 4.1 04/03/2018 0508   VLDL 18 04/03/2018 0508   LDLCALC 72 04/03/2018 0508      Wt Readings from Last 3 Encounters:  07/04/18 185 lb (83.9 kg)  06/28/18 184 lb (83.5 kg)  06/26/18 188 lb (85.3 kg)      Other studies Reviewed: Additional studies/ records that were reviewed today include: Hospital records, echo. Review of the above records demonstrates:  Please see elsewhere in the note.     ASSESSMENT AND PLAN:  LVH:   This is related to hypertensive heart disease.  This is related to her difficult to control hypertension which is not fairly well controlled on multiple meds.  I do not have any suggestion to make changes that she could have up titration of her ACE inhibitor or beta-blocker in the future as needed.  No further imaging is indicated.  AI: This was mild and can be followed clinically.    I will repeat an echo in 1 year to follow this in the LVH as above.  HTN: Manage as above.  DM: A1c was well controlled as above.  PULMONARY EMBOLISM: She is on appropriate dose anticoagulation with a planned course for a provoked pulmonary embolism.  Current medicines are reviewed at length with the patient today.  The patient does not have concerns regarding medicines.  The following changes have been made:  no change  Labs/ tests ordered today include: None  Orders Placed This Encounter  Procedures  . EKG 12-Lead     Disposition:   FU with me in one year.     Signed, Minus Breeding, MD  07/04/2018 2:29 PM  [facility]

## 2018-07-04 ENCOUNTER — Ambulatory Visit (INDEPENDENT_AMBULATORY_CARE_PROVIDER_SITE_OTHER): Payer: Medicare Other | Admitting: Cardiology

## 2018-07-04 ENCOUNTER — Encounter: Payer: Self-pay | Admitting: Cardiology

## 2018-07-04 VITALS — BP 130/98 | HR 77 | Ht 61.0 in | Wt 185.0 lb

## 2018-07-04 DIAGNOSIS — I119 Hypertensive heart disease without heart failure: Secondary | ICD-10-CM

## 2018-07-04 DIAGNOSIS — I10 Essential (primary) hypertension: Secondary | ICD-10-CM

## 2018-07-04 DIAGNOSIS — I351 Nonrheumatic aortic (valve) insufficiency: Secondary | ICD-10-CM | POA: Diagnosis not present

## 2018-07-04 NOTE — Patient Instructions (Signed)
Medication Instructions:  Continue current medications  If you need a refill on your cardiac medications before your next appointment, please call your pharmacy.  Labwork: None Ordered   If you have labs (blood work) drawn today and your tests are completely normal, you will receive your results only by: . MyChart Message (if you have MyChart) OR . A paper copy in the mail If you have any lab test that is abnormal or we need to change your treatment, we will call you to review the results.  Testing/Procedures: None Ordered  Follow-Up: You will need a follow up appointment in 1 Year.  Please call our office 2 months in advance([phone]) to schedule the (1 Year) appointment.  You may see  DR Hochrein, or one of the following Advanced Practice Providers on your designated Care Team:    . Kathryn Lawrence, DNP, ANP . Francisca Barrett, PA-C  . Luke Kilroy, PA-C  . Hao Meng, PA-C . Angela Duke, PA-C  At [facility], you and your health needs are our priority.  As part of our continuing mission to provide you with exceptional heart care, we have created designated Provider Care Teams.  These Care Teams include your primary Cardiologist (physician) and Advanced Practice Providers (APPs -  Physician Assistants and Nurse Practitioners) who all work together to provide you with the care you need, when you need it.   Thank you for choosing [facility] at [facility]!!       

## 2018-07-05 ENCOUNTER — Ambulatory Visit (INDEPENDENT_AMBULATORY_CARE_PROVIDER_SITE_OTHER): Payer: Self-pay | Admitting: Vascular Surgery

## 2018-07-05 ENCOUNTER — Encounter: Payer: Self-pay | Admitting: Vascular Surgery

## 2018-07-05 ENCOUNTER — Other Ambulatory Visit: Payer: Self-pay

## 2018-07-05 VITALS — BP 157/106 | HR 76 | Temp 97.7°F | Resp 22 | Ht 61.0 in | Wt 184.0 lb

## 2018-07-05 DIAGNOSIS — I739 Peripheral vascular disease, unspecified: Secondary | ICD-10-CM

## 2018-07-05 NOTE — Progress Notes (Signed)
    Subjective:     Patient ID: Diamond Rice, female   DOB: 02-10-1971, 47 y.o.   MRN: [id number]  HPI 47 year old female follows up for wound check.  She has previously undergone revascularization of her left lower extremity with subsequent toe amputation.  Last check her ABI was 0.81 and triphasic with a palpable pulse.   Review of Systems Wound left foot    Objective:   Physical Exam Awake alert oriented Palpable left dorsalis pedis       Assessment:     47 year old female status post left lower extremity amputation of her third, fourth and fifth toes with subsequent debridement after revascularization now the wound appears to be healing well with contraction    Plan:     She will follow-up in 4 to 5 weeks with wound check.  Ger Nicks C. Donzetta Matters, MD [facility] [place] Office: [phone] Pager: [phone]

## 2018-07-23 ENCOUNTER — Encounter: Payer: Self-pay | Admitting: Adult Health

## 2018-07-23 ENCOUNTER — Non-Acute Institutional Stay (SKILLED_NURSING_FACILITY): Payer: Medicare Other | Admitting: Adult Health

## 2018-07-23 DIAGNOSIS — F250 Schizoaffective disorder, bipolar type: Secondary | ICD-10-CM

## 2018-07-23 DIAGNOSIS — E1151 Type 2 diabetes mellitus with diabetic peripheral angiopathy without gangrene: Secondary | ICD-10-CM | POA: Diagnosis not present

## 2018-07-23 DIAGNOSIS — D509 Iron deficiency anemia, unspecified: Secondary | ICD-10-CM | POA: Diagnosis not present

## 2018-07-23 DIAGNOSIS — I2699 Other pulmonary embolism without acute cor pulmonale: Secondary | ICD-10-CM

## 2018-07-23 DIAGNOSIS — I119 Hypertensive heart disease without heart failure: Secondary | ICD-10-CM | POA: Diagnosis not present

## 2018-07-23 DIAGNOSIS — G629 Polyneuropathy, unspecified: Secondary | ICD-10-CM

## 2018-07-23 DIAGNOSIS — Z8673 Personal history of transient ischemic attack (TIA), and cerebral infarction without residual deficits: Secondary | ICD-10-CM

## 2018-07-23 DIAGNOSIS — Z0279 Encounter for issue of other medical certificate: Secondary | ICD-10-CM

## 2018-07-23 NOTE — Progress Notes (Addendum)
Location:  [place] Room Number: 212-A Place of Service:  SNF (31) Provider:  [name], NP  Patient Care Team: [name], MD as PCP - General (Internal Medicine)  Extended Emergency Contact Information Primary Emergency Contact: [name], [place] Phone: [phone] Relation: Brother Secondary Emergency Contact: [name] Phone: [phone] Relation: Other  Code Status:  Full Code  Goals of care: Advanced Directive information Advanced Directives 07/05/2018  Does Patient Have a Medical Advance Directive? No  Would patient like information on creating a medical advance directive? -  Some encounter information is confidential and restricted. Go to Review Flowsheets activity to see all data.     Chief Complaint  Patient presents with  . Medical Management of Chronic Issues    Routine [facility] visit    HPI:  Pt is a 47 y.o. female seen today for medical management of chronic diseases.  She is a short-term rehabilitation resident of [facility].  She has a PMH of schizoaffective disorder, DM, sleep apnea, history of stroke, essential hypertension, dyslipidemia, history of MI, depression, COPD, and anxiety disorder. She was seen in the room today with treatment nurse. She denies having pain while treatment is being done on her left foot. CBGs are stable - 176, 160, 251, 247, 146, 187. She verbalized being sleepy in the morning after taking Neurontin and cannot participate well in therapy.   She has been admitted to [facility] on 06/25/18 from a recent hospitalization due to acute right lower lobe PE. She had recently undergone a left 3rd to 5th digit toe amputation on 05/28/18. She was started on Heparin drip in the ED and was transitioned to Xarelto 15 mg BID and Aspirin. After discussion with vascular surgeon, ASA was changed to  Plavix to decrease bleeding risk. Plavix and Xarelto will will be continued X 3 months.   Past Medical History:  Diagnosis Date  . Anxiety   . Chronic lower back pain 04/17/2012   "just got over some; catched when I walked"  . COPD (chronic obstructive pulmonary disease) ([place])   . Critical lower limb ischemia 05/27/2018  . Depression   . Headache(784.0) 04/17/2012   "~ qod; lately waking up in am w/one"  . High cholesterol   . Hypertension   . Migraines 04/17/2012  . Obesity   . Sleep apnea    CPAP  . Stroke ([place])   . Type II diabetes mellitus ([place]) 08/28/2002   Past Surgical History:  Procedure Laterality Date  . ABDOMINAL AORTOGRAM W/LOWER EXTREMITY Left 05/27/2018   Procedure: ABDOMINAL AORTOGRAM W/LOWER EXTREMITY Runoff and Possible Intervention;  Surgeon: Waynetta Sandy, MD;  Location: [facility];  Service: Cardiovascular;  Laterality: Left;  . APPLICATION OF WOUND VAC Left 06/06/2018   Procedure: APPLICATION OF WOUND VAC;  Surgeon: Waynetta Sandy, MD;  Location: [facility];  Service: Vascular;  Laterality: Left;  . BIOPSY  04/04/2018   Procedure: BIOPSY;  Surgeon: [name], MD;  Location: [facility] ENDOSCOPY;  Service: Endoscopy;;  . CARDIAC CATHETERIZATION  ~ 2011  . COLONOSCOPY N/A 04/04/2018   Procedure: COLONOSCOPY;  Surgeon: [name], MD;  Location: [facility] ENDOSCOPY;  Service: Endoscopy;  Laterality: N/A;  . LOWER EXTREMITY ANGIOGRAM Left 05/27/2018  . PERIPHERAL VASCULAR INTERVENTION Left 05/27/2018   Procedure: PERIPHERAL VASCULAR INTERVENTION;  Surgeon: Waynetta Sandy, MD;  Location: [place] CV LAB;  Service: Cardiovascular;  Laterality: Left;  . POLYPECTOMY  04/04/2018   Procedure: POLYPECTOMY;  Surgeon: [name], MD;  Location: [facility] ENDOSCOPY;  Service: Endoscopy;;  . TRANSMETATARSAL AMPUTATION Left 05/28/2018   Procedure: AMPUTATION TOES THREE, FOUR AND FIVE on left foot;  Surgeon: Waynetta Sandy, MD;  Location: [place];   Service: Vascular;  Laterality: Left;  . WOUND DEBRIDEMENT Left 06/06/2018   Procedure: DEBRIDEMENT WOUND LEFT FOOT;  Surgeon: Waynetta Sandy, MD;  Location: [place];  Service: Vascular;  Laterality: Left;    Allergies  Allergen Reactions  . Morphine And Related Hives  . Metformin Diarrhea    Outpatient Encounter Medications as of 07/23/2018  Medication Sig  . alum & mag hydroxide-simeth (MAALOX/MYLANTA) 200-200-20 MG/5ML suspension Take 30 mLs by mouth every 2 (two) hours. For 24 hrs  . ARIPiprazole (ABILIFY) 15 MG tablet Take 1 tablet (15 mg total) by mouth at bedtime.  . cloNIDine (CATAPRES) 0.3 MG tablet Take 1 tablet (0.3 mg total) by mouth 3 (three) times daily.  . clopidogrel (PLAVIX) 75 MG tablet Take 1 tablet (75 mg total) by mouth daily.  . ferrous sulfate 325 (65 FE) MG tablet Take 325 mg by mouth 2 (two) times daily with a meal.  . furosemide (LASIX) 40 MG tablet Take 40 mg by mouth daily.  Marland Kitchen gabapentin (NEURONTIN) 100 MG capsule Take 100 mg by mouth 3 (three) times daily.  . hydrALAZINE (APRESOLINE) 50 MG tablet Take 1 tablet (50 mg total) by mouth every 8 (eight) hours.  . Insulin Detemir (LEVEMIR) 100 UNIT/ML Pen Inject 15 Units into the skin 2 (two) times daily.   Marland Kitchen lisinopril (PRINIVIL,ZESTRIL) 30 MG tablet Take 1 tablet (30 mg total) by mouth daily.  . Melatonin 3 MG TABS Take 3 mg by mouth at bedtime.   . Menthol, Topical Analgesic, (BIOFREEZE) 4 % GEL Apply 1 application topically every 6 (six) hours as needed (Moderate pain).  . metoprolol tartrate (LOPRESSOR) 25 MG tablet Take 25 mg by mouth 2 (two) times daily.  Marland Kitchen oxycodone (OXY-IR) 5 MG capsule Take 5 mg by mouth 2 (two) times daily as needed for pain.   . pentoxifylline (TRENTAL) 400 MG CR tablet Take 400 mg by mouth 3 (three) times daily with meals.   . polyethylene glycol (MIRALAX / GLYCOLAX) packet Take 17 g by mouth daily.  . potassium chloride (K-DUR) 10 MEQ tablet Take 2 tablets (20 mEq total) by  mouth daily.  . Rivaroxaban 15 & 20 MG TBPK Take as directed on package: Start with one 15mg  tablet by mouth twice a day with food. On Day 22, switch to one 20mg  tablet once a day with food.  . sennosides-docusate sodium (SENOKOT-S) 8.6-50 MG tablet Take 2 tablets by mouth 2 (two) times daily.   No facility-administered encounter medications on file as of 07/23/2018.     Review of Systems  GENERAL: No change in appetite, no fatigue, no weight changes, no fever, chills or weakness MOUTH and THROAT: Denies oral discomfort, gingival pain or bleeding RESPIRATORY: no cough, SOB, DOE, wheezing, hemoptysis CARDIAC: No chest pain, edema or palpitations GI: No abdominal pain, diarrhea, constipation, heart burn, nausea or vomiting GU: Denies dysuria, frequency, hematuria, incontinence, or discharge PSYCHIATRIC: Denies feelings of depression or anxiety. No report of hallucinations, insomnia, paranoia, or agitation   Immunization History  Administered Date(s) Administered  . Influenza,inj,Quad PF,6+ Mos 05/24/2013, 08/29/2014, 10/08/2015  . Influenza-Unspecified 06/22/2016, 07/01/2018  . PPD Test 11/10/2016  . Pneumococcal Polysaccharide-23 04/18/2012,  10/08/2015, 02/14/2017   Pertinent  Health Maintenance Due  Topic Date Due  . PAP SMEAR  05/06/1993  . OPHTHALMOLOGY EXAM  08/13/2014  . FOOT EXAM  10/15/2015  . HEMOGLOBIN A1C  12/22/2018  . INFLUENZA VACCINE  Completed   Fall Risk  12/07/2014 10/14/2014  Falls in the past year? No No      Vitals:   07/23/18 1127  BP: 126/84  Pulse: 65  Resp: 18  Temp: 98.4 F (36.9 C)  TempSrc: Oral  Weight: 197 lb 9.6 oz (89.6 kg)  Height: 5\' 1"  (1.549 m)   Body mass index is 37.34 kg/m.  Physical Exam  GENERAL APPEARANCE: Well nourished. In no acute distress. Obese SKIN:  Left foot surgical wound peri-wound is reddish, no pus noted MOUTH and THROAT: Lips are without lesions. Oral mucosa is moist and without lesions. Tongue is normal in  shape, size, and color and without lesions RESPIRATORY: Breathing is even & unlabored, BS CTAB CARDIAC: RRR, no murmur,no extra heart sounds, no edema GI: Abdomen soft, normal BS, no masses, no tenderness EXTREMITIES:  Able to move X 4 extremities NEUROLOGICAL: There is no tremor. Speech is clear. Alert and oriented X 3. PSYCHIATRIC:  Affect and behavior are appropriate   Labs reviewed: Recent Labs    05/22/18 1309  06/23/18 0303 06/24/18 0333 06/25/18 0305  NA 141   < > 134* 135 136  K 2.7*   < > 3.4* 3.3* 3.5  CL 99   < > 104 104 103  CO2 29   < > 24 26 25   GLUCOSE 181*   < > 173* 165* 165*  BUN 15   < > 8 8 12   CREATININE 1.61*   < > 1.10* 1.12* 1.19*  CALCIUM 9.6   < > 9.2 9.0 9.4  MG 2.0  --   --   --   --    < > = values in this interval not displayed.   Recent Labs    03/17/18 0554 05/22/18 1309 06/22/18 1230  AST 12* 13* 14*  ALT 10 9 9   ALKPHOS 64 51 72  BILITOT 0.6 0.7 0.7  PROT 7.6 7.8 7.5  ALBUMIN 3.5 3.7 3.3*   Recent Labs    06/13/18 06/17/18  06/22/18 1230  06/23/18 0303 06/24/18 0333 06/25/18 0305  WBC 7.6 8.7   < > 6.9  --  10.6* 8.8 8.2  NEUTROABS 4 4  --  3.7  --   --   --   --   HGB 8.0* 8.1*   < > 9.0*   < > 8.0* 7.6* 8.0*  HCT 25* 26*   < > 30.2*   < > 25.8* 25.0* 25.9*  MCV  --   --    < > 73.7*  --  71.7* 71.8* 71.7*  PLT 427* 544*   < > 635*  --  569* 511* 530*   < > = values in this interval not displayed.   Lab Results  Component Value Date   TSH 0.705 04/02/2018   Lab Results  Component Value Date   HGBA1C 6.8 (H) 06/22/2018   Lab Results  Component Value Date   CHOL 119 04/03/2018   HDL 29 (L) 04/03/2018   LDLCALC 72 04/03/2018   TRIG 90 04/03/2018   CHOLHDL 4.1 04/03/2018    Assessment/Plan  1. Acute pulmonary embolism without acute cor pulmonale, unspecified pulmonary embolism type (HCC) - no SOB, continue Xarelto 20 mg 1 tab daily  2. Hypertensive heart disease without heart failure  - will follow-up with  cardiology, continue metoprolol succinate ER 25 mg 1 tab twice a day, hydralazine 50 mg 1 tab every 8 hours, clonidine 0.3 mg 1 tab 3 times daily, lisinopril 30 mg 1 tab daily   3. Microcytic anemia - continue ferrous sulfate 325 mg 1 tab twice daily Lab Results  Component Value Date   HGB 8.0 (L) 06/25/2018     4. DM (diabetes mellitus) with peripheral vascular complication (HCC) -continue Pentoxifylline ER 400 mg 1 tab 3 times a day, Levemir 100 units/mL inject 15 units subcutaneously twice a day Lab Results  Component Value Date   HGBA1C 6.8 (H) 06/22/2018    5. Schizoaffective disorder, bipolar type ([place]) - mood is stable, continue Aripiprazole 15 mg nightly   6.  History of CVA -stable, continue Plavix 75 mg 1 tab daily, clonidine 0.3 mg 1 tab 3 times daily, hydralazine 50 mg 1 tab every 8 hours, metoprolol succinate ER 25 mg 1 tab twice a day, clonidine 0.3 mg 1 tab 3 times daily, lisinopril 30 mg 1 tab daily   7.  Neuropathy -verbalized being sleepy in the morning, will decrease Neurontin 100 mg 3 times daily to twice daily    Family/ staff Communication:  Discussed plan of care with resident.  Labs/tests ordered:  None  Goals of care:   Short-term rehabilitation.   [name], NP [facility] [phone] (Monday-Friday 8:00 a.m. - 5:00 p.m.) [phone] (after hours)

## 2018-08-02 ENCOUNTER — Ambulatory Visit (INDEPENDENT_AMBULATORY_CARE_PROVIDER_SITE_OTHER): Payer: Self-pay | Admitting: Family

## 2018-08-02 ENCOUNTER — Encounter: Payer: Self-pay | Admitting: Family

## 2018-08-02 VITALS — BP 151/101 | HR 66 | Temp 97.0°F | Resp 12 | Ht 61.0 in

## 2018-08-02 DIAGNOSIS — Z720 Tobacco use: Secondary | ICD-10-CM

## 2018-08-02 DIAGNOSIS — S98132A Complete traumatic amputation of one left lesser toe, initial encounter: Secondary | ICD-10-CM

## 2018-08-02 DIAGNOSIS — Z9862 Peripheral vascular angioplasty status: Secondary | ICD-10-CM

## 2018-08-02 DIAGNOSIS — I779 Disorder of arteries and arterioles, unspecified: Secondary | ICD-10-CM

## 2018-08-02 NOTE — Progress Notes (Signed)
[facility] [place]   CC: Post operative 4 week wound check, history of peripheral artery occlusive disease  History of Present Illness Diamond Rice is a 47 y.o. female who is s/p Excisional debridement left postsurgical wound to 5.5 x 3 x 2 cm on 06-06-18 by Dr. Donzetta Matters, Amputation left sided toes 3 through 5 on 05-28-18 by Dr. Donzetta Matters, and Ultrasound-guided cannulation left dorsalis pedis artery with balloon angioplasty of left anterior tibial artery with 2.5 mm balloon on 05-27-18 by Dr. Donzetta Matters for critical left lower extremity ischemia with wound.   She was last evaluated by Dr. Donzetta Matters on 07-05-18. At that time the wound appeared to be healing well with contraction of wound edges. Pt was to follow-up in 4 to 5 weeks with wound check.  She has not had any fevers or chills.  She is in rehab at [facility], and pt states her left foot wound has been dressed daily with Santyl.  She plans to return home to live with a family member after her wound heals.   Diabetic: Yes ,  A1C was 6.8 on 06-22-18, good control  Tobacco use: former smoker, but she states that she had a cigarette at Thanksgiving a week ago.   Pt meds include: Statin :No Betablocker: Yes ASA: No Other anticoagulants/antiplatelets: Plavix, Xarelto   Past Medical History:  Diagnosis Date  . Anxiety   . Chronic lower back pain 04/17/2012   "just got over some; catched when I walked"  . COPD (chronic obstructive pulmonary disease) ([place])   . Critical lower limb ischemia 05/27/2018  . Depression   . Headache(784.0) 04/17/2012   "~ qod; lately waking up in am w/one"  . High cholesterol   . Hypertension   . Migraines 04/17/2012  . Obesity   . Sleep apnea    CPAP  . Stroke ([place])   . Type II diabetes mellitus ([place]) 08/28/2002    Social History Social History   Tobacco Use  . Smoking status: Former Smoker    Packs/day: 0.25    Years: 28.00    Pack years: 7.00    Types: Cigarettes  . Smokeless tobacco:  Never Used  Substance Use Topics  . Alcohol use: No    Alcohol/week: 0.0 standard drinks    Comment: rare  . Drug use: Yes    Types: Marijuana, "Crack" cocaine    Comment: marijuana last use days ago; last cocaine several months ago    Family History Family History  Problem Relation Age of Onset  . Stroke Brother 60  . Stroke Brother 45  . Heart attack Mother 51  . Stroke Father 18    Past Surgical History:  Procedure Laterality Date  . ABDOMINAL AORTOGRAM W/LOWER EXTREMITY Left 05/27/2018   Procedure: ABDOMINAL AORTOGRAM W/LOWER EXTREMITY Runoff and Possible Intervention;  Surgeon: Waynetta Sandy, MD;  Location: [place] CV LAB;  Service: Cardiovascular;  Laterality: Left;  . APPLICATION OF WOUND VAC Left 06/06/2018   Procedure: APPLICATION OF WOUND VAC;  Surgeon: Waynetta Sandy, MD;  Location: [place];  Service: Vascular;  Laterality: Left;  . BIOPSY  04/04/2018   Procedure: BIOPSY;  Surgeon: [name], MD;  Location: [facility] ENDOSCOPY;  Service: Endoscopy;;  . CARDIAC CATHETERIZATION  ~ 2011  . COLONOSCOPY N/A 04/04/2018   Procedure: COLONOSCOPY;  Surgeon: [name], MD;  Location: [facility] ENDOSCOPY;  Service: Endoscopy;  Laterality: N/A;  . LOWER EXTREMITY ANGIOGRAM Left 05/27/2018  . PERIPHERAL VASCULAR INTERVENTION Left 05/27/2018  Procedure: PERIPHERAL VASCULAR INTERVENTION;  Surgeon: Waynetta Sandy, MD;  Location: [place] CV LAB;  Service: Cardiovascular;  Laterality: Left;  . POLYPECTOMY  04/04/2018   Procedure: POLYPECTOMY;  Surgeon: [name], MD;  Location: [facility] ENDOSCOPY;  Service: Endoscopy;;  . TRANSMETATARSAL AMPUTATION Left 05/28/2018   Procedure: AMPUTATION TOES THREE, FOUR AND FIVE on left foot;  Surgeon: Waynetta Sandy, MD;  Location: [place];  Service: Vascular;  Laterality: Left;  . WOUND DEBRIDEMENT Left 06/06/2018   Procedure: DEBRIDEMENT WOUND LEFT FOOT;  Surgeon: Waynetta Sandy, MD;  Location: [place];   Service: Vascular;  Laterality: Left;    Allergies  Allergen Reactions  . Morphine And Related Hives  . Metformin Diarrhea    Current Outpatient Medications  Medication Sig Dispense Refill  . alum & mag hydroxide-simeth (MAALOX/MYLANTA) 200-200-20 MG/5ML suspension Take 30 mLs by mouth every 2 (two) hours. For 24 hrs    . ARIPiprazole (ABILIFY) 15 MG tablet Take 1 tablet (15 mg total) by mouth at bedtime. 30 tablet 0  . cloNIDine (CATAPRES) 0.3 MG tablet Take 1 tablet (0.3 mg total) by mouth 3 (three) times daily. 90 tablet 0  . clopidogrel (PLAVIX) 75 MG tablet Take 1 tablet (75 mg total) by mouth daily. 30 tablet 3  . ferrous sulfate 325 (65 FE) MG tablet Take 325 mg by mouth 2 (two) times daily with a meal.    . furosemide (LASIX) 40 MG tablet Take 40 mg by mouth daily.    Marland Kitchen gabapentin (NEURONTIN) 100 MG capsule Take 100 mg by mouth 3 (three) times daily.    . hydrALAZINE (APRESOLINE) 50 MG tablet Take 1 tablet (50 mg total) by mouth every 8 (eight) hours. 90 tablet 0  . Insulin Detemir (LEVEMIR) 100 UNIT/ML Pen Inject 15 Units into the skin 2 (two) times daily.     Marland Kitchen lisinopril (PRINIVIL,ZESTRIL) 30 MG tablet Take 1 tablet (30 mg total) by mouth daily. 30 tablet 0  . Melatonin 3 MG TABS Take 3 mg by mouth at bedtime.     . Menthol, Topical Analgesic, (BIOFREEZE) 4 % GEL Apply 1 application topically every 6 (six) hours as needed (Moderate pain).    . metoprolol tartrate (LOPRESSOR) 25 MG tablet Take 25 mg by mouth 2 (two) times daily.    Marland Kitchen oxycodone (OXY-IR) 5 MG capsule Take 5 mg by mouth 2 (two) times daily as needed for pain.     . pentoxifylline (TRENTAL) 400 MG CR tablet Take 400 mg by mouth 3 (three) times daily with meals.     . polyethylene glycol (MIRALAX / GLYCOLAX) packet Take 17 g by mouth daily.    . potassium chloride (K-DUR) 10 MEQ tablet Take 2 tablets (20 mEq total) by mouth daily. 14 tablet 0  . Rivaroxaban 15 & 20 MG TBPK Take as directed on package: Start with one  15mg  tablet by mouth twice a day with food. On Day 22, switch to one 20mg  tablet once a day with food. 51 each 0  . sennosides-docusate sodium (SENOKOT-S) 8.6-50 MG tablet Take 2 tablets by mouth 2 (two) times daily.     No current facility-administered medications for this visit.     ROS: See HPI for pertinent positives and negatives.   Physical Examination  Vitals:   08/02/18 1035  BP: (!) 151/101  Pulse: 66  Resp: 12  Temp: (!) 97 F (36.1 C)  SpO2: 99%  Height: 5\' 1"  (1.549 m)   Body mass index  is 37.34 kg/m.  General: A&O x 3, WDWN, obese female. Gait: seated in w/c HENT: No gross abnormalities.  Eyes: PERRLA. Pulmonary: Respirations are non labored, CTAB, fair air movement in all fields Cardiac: regular rhythm, no detected murmur.         Carotid Bruits Right Left   Negative Negative   Radial pulses are 1+ palpable bilaterally   Adominal aortic pulse is not palpable                         VASCULAR EXAM: Extremities without ischemic changes, without Gangrene; with open wound at left 3rd-5th toes amputation site. Wound signs of granulation and beefy red tissue base. See photo below.    Left foot, healing wound                                                                                                           LE Pulses Right Left       FEMORAL  not palpable, seated in w/c, obese  not palpable        POPLITEAL  not palpable   not palpable       POSTERIOR TIBIAL  not palpable   not palpable, brisk Doppler signal        DORSALIS PEDIS      ANTERIOR TIBIAL not palpable  not palpable, unable to obtain Doppler signal         PERONEAL   Not palpable, brisk Doppler signal   Abdomen: soft, NT, no palpable masses. Skin: no rashes, no cellulitis, see above photo Musculoskeletal: no muscle wasting or atrophy.  Neurologic: A&O X 3; appropriate affect, Sensation is normal; MOTOR FUNCTION:  moving all extremities equally, motor strength 5/5 throughout. Speech  is fluent/normal. CN 2-12 intact. Psychiatric: Thought content is normal but slightly sluggish and slow speech, mood appropriate for clinical situation.     ASSESSMENT: Diamond Rice is a 47 y.o. female who is s/p Excisional debridement left postsurgical wound to 5.5 x 3 x 2 cm on 06-06-18 by Dr. Donzetta Matters, Amputation left sided toes 3 through 5 on 05-28-18 by Dr. Donzetta Matters, and Ultrasound-guided cannulation left dorsalis pedis artery with balloon angioplasty of left anterior tibial artery with 2.5 mm balloon on 05-27-18 by Dr. Donzetta Matters for critical left lower extremity ischemia with wound.    Healing and granulating left foot wound with healthy red tissue at wound base.  Brisk left foot PT and peroneal Doppler signals, unable to detect left DP signal.   Dr. Donzetta Matters spoke with and evaluated pt.  Pt requesting flat post op shoe, may have this. Stop Santyl since wound bed is healthy appearing, start daily silvadene dressing changes.  Continue wound care per [facility].  Return in 2 months with ABI's and left LE arterial duplex. Return sooner if needed.     PLAN:  Pt requesting flat post op shoe, may have this. Stop Santyl since wound bed is healthy appearing, start daily silvadene dressing changes.  Continue wound care per [facility].  Return in 2 months with ABI's and left LE arterial duplex. Returns sooner if needed.    I discussed in depth with the patient the nature of atherosclerosis, and emphasized the importance of maximal medical management including strict control of blood pressure, blood glucose, and lipid levels, obtaining regular exercise, and cessation of smoking.  The patient is aware that without maximal medical management the underlying atherosclerotic disease process will progress, limiting the benefit of any interventions.  The patient was given information about PAD including signs, symptoms, treatment, what symptoms should prompt the patient to seek immediate medical care, and  risk reduction measures to take.  Clemon Chambers, RN, MSN, FNP-C [facility] [place] Phone: [phone]  Clinic MD: Donzetta Matters  08/02/18 10:51 AM

## 2018-08-02 NOTE — Patient Instructions (Signed)

## 2018-08-05 ENCOUNTER — Encounter: Payer: Self-pay | Admitting: Adult Health

## 2018-08-05 ENCOUNTER — Non-Acute Institutional Stay (SKILLED_NURSING_FACILITY): Payer: Medicare Other | Admitting: Adult Health

## 2018-08-05 DIAGNOSIS — I10 Essential (primary) hypertension: Secondary | ICD-10-CM | POA: Diagnosis not present

## 2018-08-05 DIAGNOSIS — Z8673 Personal history of transient ischemic attack (TIA), and cerebral infarction without residual deficits: Secondary | ICD-10-CM

## 2018-08-05 DIAGNOSIS — I2782 Chronic pulmonary embolism: Secondary | ICD-10-CM

## 2018-08-05 NOTE — Progress Notes (Signed)
Location:  [place] Room Number: 212-A Place of Service:  SNF (31) Provider:  [name], NP  Patient Care Team: [name], MD as PCP - General (Internal Medicine)  Extended Emergency Contact Information Primary Emergency Contact: [name], [place] Phone: [phone] Relation: Brother Secondary Emergency Contact: [name] Phone: [phone] Relation: Other  Code Status:  Full Code  Goals of care: Advanced Directive information Advanced Directives 07/05/2018  Does Patient Have a Medical Advance Directive? No  Would patient like information on creating a medical advance directive? -  Some encounter information is confidential and restricted. Go to Review Flowsheets activity to see all data.     Chief Complaint  Patient presents with  . Acute Visit    Patient has elevated blood pressures.    HPI:  Pt is a 47 y.o. female seen today for an acute visit secondary to elevated blood pressures.  She is a short-term rehabilitation resident of [facility].  She has a PMH of schizoaffective disorder, DM, sleep apnea, history of stroke, essential HTN, dyslipidemia, history of MI, depression, COPD, and anxiety disorder. Her BP this morning was 200/100. She was given her routine morning medications - Metoprolol ER 25 mg, Hydralazine 50 mg, Lisinopril 30 mg and Clonidine 0.3 mg. BP after 1 hour was reported to be 186/118. She denies having headache nor dizziness.  Past Medical History:  Diagnosis Date  . Anxiety   . Chronic lower back pain 04/17/2012   "just got over some; catched when I walked"  . COPD (chronic obstructive pulmonary disease) ([place])   . Critical lower limb ischemia 05/27/2018  . Depression   . Headache(784.0) 04/17/2012   "~ qod; lately waking up in am w/one"  . High cholesterol   . Hypertension   . Migraines 04/17/2012  . Obesity   . Sleep  apnea    CPAP  . Stroke ([place])   . Type II diabetes mellitus ([place]) 08/28/2002   Past Surgical History:  Procedure Laterality Date  . ABDOMINAL AORTOGRAM W/LOWER EXTREMITY Left 05/27/2018   Procedure: ABDOMINAL AORTOGRAM W/LOWER EXTREMITY Runoff and Possible Intervention;  Surgeon: Waynetta Sandy, MD;  Location: [place] CV LAB;  Service: Cardiovascular;  Laterality: Left;  . APPLICATION OF WOUND VAC Left 06/06/2018   Procedure: APPLICATION OF WOUND VAC;  Surgeon: Waynetta Sandy, MD;  Location: [place];  Service: Vascular;  Laterality: Left;  . BIOPSY  04/04/2018   Procedure: BIOPSY;  Surgeon: [name], MD;  Location: [facility] ENDOSCOPY;  Service: Endoscopy;;  . CARDIAC CATHETERIZATION  ~ 2011  . COLONOSCOPY N/A 04/04/2018   Procedure: COLONOSCOPY;  Surgeon: [name], MD;  Location: [facility] ENDOSCOPY;  Service: Endoscopy;  Laterality: N/A;  . LOWER EXTREMITY ANGIOGRAM Left 05/27/2018  . PERIPHERAL VASCULAR INTERVENTION Left 05/27/2018   Procedure: PERIPHERAL VASCULAR INTERVENTION;  Surgeon: Waynetta Sandy, MD;  Location: [place] CV LAB;  Service: Cardiovascular;  Laterality: Left;  . POLYPECTOMY  04/04/2018   Procedure: POLYPECTOMY;  Surgeon: [name], MD;  Location: [facility] ENDOSCOPY;  Service: Endoscopy;;  . TRANSMETATARSAL AMPUTATION Left 05/28/2018   Procedure: AMPUTATION TOES THREE, FOUR AND FIVE on left foot;  Surgeon: Waynetta Sandy, MD;  Location: [place];  Service: Vascular;  Laterality: Left;  . WOUND DEBRIDEMENT Left 06/06/2018   Procedure: DEBRIDEMENT WOUND LEFT FOOT;  Surgeon: Waynetta Sandy, MD;  Location: [place];  Service: Vascular;  Laterality: Left;    Allergies  Allergen Reactions  . Morphine And Related Hives  . Metformin Diarrhea    Outpatient Encounter Medications as of 08/05/2018  Medication Sig  . ARIPiprazole (ABILIFY) 15 MG tablet Take 1 tablet (15 mg total) by mouth at bedtime.  . cloNIDine (CATAPRES) 0.3 MG  tablet Take 1 tablet (0.3 mg total) by mouth 3 (three) times daily.  . clopidogrel (PLAVIX) 75 MG tablet Take 1 tablet (75 mg total) by mouth daily.  . ferrous sulfate 325 (65 FE) MG tablet Take 325 mg by mouth 2 (two) times daily with a meal.  . furosemide (LASIX) 40 MG tablet Take 40 mg by mouth daily.  Marland Kitchen gabapentin (NEURONTIN) 100 MG capsule Take 100 mg by mouth See admin instructions. 100 mg at 1 pm and 100 mg qhs  . hydrALAZINE (APRESOLINE) 50 MG tablet Take 1 tablet (50 mg total) by mouth every 8 (eight) hours.  . Insulin Detemir (LEVEMIR) 100 UNIT/ML Pen Inject 15 Units into the skin 2 (two) times daily.   Marland Kitchen lisinopril (PRINIVIL,ZESTRIL) 30 MG tablet Take 1 tablet (30 mg total) by mouth daily.  . Melatonin 3 MG TABS Take 3 mg by mouth at bedtime.   . Menthol, Topical Analgesic, (BIOFREEZE) 4 % GEL Apply 1 application topically every 6 (six) hours as needed (Moderate pain).  . metoprolol tartrate (LOPRESSOR) 25 MG tablet Take 25 mg by mouth 2 (two) times daily.  Marland Kitchen oxycodone (OXY-IR) 5 MG capsule Take 5 mg by mouth every 6 (six) hours as needed for pain.   . pentoxifylline (TRENTAL) 400 MG CR tablet Take 400 mg by mouth 3 (three) times daily with meals.   . polyethylene glycol (MIRALAX / GLYCOLAX) packet Take 17 g by mouth daily.  . potassium chloride (K-DUR) 10 MEQ tablet Take 2 tablets (20 mEq total) by mouth daily.  . Rivaroxaban 15 & 20 MG TBPK Take as directed on package: Start with one 15mg  tablet by mouth twice a day with food. On Day 22, switch to one 20mg  tablet once a day with food.  . sennosides-docusate sodium (SENOKOT-S) 8.6-50 MG tablet Take 2 tablets by mouth 2 (two) times daily.  . [DISCONTINUED] alum & mag hydroxide-simeth (MAALOX/MYLANTA) 200-200-20 MG/5ML suspension Take 30 mLs by mouth every 2 (two) hours. For 24 hrs   No facility-administered encounter medications on file as of 08/05/2018.     Review of Systems  GENERAL: No change in appetite, no fatigue, no weight  changes, no fever, chills or weakness MOUTH and THROAT: Denies oral discomfort, gingival pain or bleeding RESPIRATORY: no cough, SOB, DOE, wheezing, hemoptysis CARDIAC: No chest pain, edema or palpitations GI: No abdominal pain, diarrhea, constipation, heart burn, nausea or vomiting GU: Denies dysuria, frequency, hematuria, incontinence, or discharge PSYCHIATRIC: Denies feelings of depression or anxiety. No report of hallucinations, insomnia, paranoia, or agitation    Immunization History  Administered Date(s) Administered  . Influenza,inj,Quad PF,6+ Mos 05/24/2013, 08/29/2014, 10/08/2015  . Influenza-Unspecified 06/22/2016, 07/01/2018  . PPD Test 11/10/2016  . Pneumococcal Polysaccharide-23 04/18/2012, 10/08/2015, 02/14/2017   Pertinent  Health Maintenance Due  Topic Date Due  . PAP SMEAR  05/06/1993  . OPHTHALMOLOGY EXAM  08/13/2014  . FOOT EXAM  10/15/2015  . HEMOGLOBIN A1C  12/22/2018  . INFLUENZA VACCINE  Completed   Fall Risk  12/07/2014 10/14/2014  Falls in the past year? No No      Vitals:   08/05/18 1549  BP: 140/84  Pulse: 67  Resp: 18  Temp: 98.1 F (36.7 C)  TempSrc: Oral  Weight: 199 lb 9.6 oz (90.5 kg)  Height: 5\' 1"  (1.549 m)   Body mass index is 37.71 kg/m.  Physical Exam  GENERAL APPEARANCE: Well nourished. In no acute distress. Obese SKIN:  Left foot covered with dressing  MOUTH and THROAT: Lips are without lesions. Oral mucosa is moist and without lesions. Tongue is normal in shape, size, and color and without lesions RESPIRATORY: Breathing is even & unlabored, BS CTAB CARDIAC: RRR, no murmur,no extra heart sounds, no edema GI: Abdomen soft, normal BS, no masses, no tenderness EXTREMITIES:  Able to move X 4 extremities NEUROLOGICAL: There is no tremor. Speech is clear. Alert and oriented X 3. PSYCHIATRIC:  Affect and behavior are appropriate   Labs reviewed: Recent Labs    05/22/18 1309  06/23/18 0303 06/24/18 0333 06/25/18 0305  NA 141    < > 134* 135 136  K 2.7*   < > 3.4* 3.3* 3.5  CL 99   < > 104 104 103  CO2 29   < > 24 26 25   GLUCOSE 181*   < > 173* 165* 165*  BUN 15   < > 8 8 12   CREATININE 1.61*   < > 1.10* 1.12* 1.19*  CALCIUM 9.6   < > 9.2 9.0 9.4  MG 2.0  --   --   --   --    < > = values in this interval not displayed.   Recent Labs    03/17/18 0554 05/22/18 1309 06/22/18 1230  AST 12* 13* 14*  ALT 10 9 9   ALKPHOS 64 51 72  BILITOT 0.6 0.7 0.7  PROT 7.6 7.8 7.5  ALBUMIN 3.5 3.7 3.3*   Recent Labs    06/13/18 06/17/18  06/22/18 1230  06/23/18 0303 06/24/18 0333 06/25/18 0305  WBC 7.6 8.7   < > 6.9  --  10.6* 8.8 8.2  NEUTROABS 4 4  --  3.7  --   --   --   --   HGB 8.0* 8.1*   < > 9.0*   < > 8.0* 7.6* 8.0*  HCT 25* 26*   < > 30.2*   < > 25.8* 25.0* 25.9*  MCV  --   --    < > 73.7*  --  71.7* 71.8* 71.7*  PLT 427* 544*   < > 635*  --  569* 511* 530*   < > = values in this interval not displayed.   Lab Results  Component Value Date   TSH 0.705 04/02/2018   Lab Results  Component Value Date   HGBA1C 6.8 (H) 06/22/2018   Lab Results  Component Value Date   CHOL 119 04/03/2018   HDL 29 (L) 04/03/2018   LDLCALC 72 04/03/2018   TRIG 90 04/03/2018   CHOLHDL 4.1 04/03/2018    Assessment/Plan  1. Uncontrolled hypertension -  will increase Hydralazine 50 mg  From Q 8 hours to QID, BP/HR BID X 1 week   2. Other chronic pulmonary embolism without acute cor pulmonale (HCC) - denies SOB,  continue Xarelto 20 mg daily   3. History of CVA (cerebrovascular accident) - BP noted to be elevated, will increase Hydralazine 50 mg from Q 8 hours to QID, continue Lisinpril 30 mg daily, Plavix 75 mg daily, Clonidine 0.3 mg TID, Metoprolol succinate ER 25 mg BID      Family/ staff Communication:   Labs/tests ordered:  None  Goals of care:  Short-term rehabilitation.   [name], NP [facility] and Adult Medicine [phone] (Monday-Friday 8:00 a.m. - 5:00  p.m.) [phone] (after hours)

## 2018-08-15 ENCOUNTER — Non-Acute Institutional Stay (SKILLED_NURSING_FACILITY): Payer: Medicare Other | Admitting: Adult Health

## 2018-08-15 ENCOUNTER — Encounter: Payer: Self-pay | Admitting: Adult Health

## 2018-08-15 DIAGNOSIS — D509 Iron deficiency anemia, unspecified: Secondary | ICD-10-CM

## 2018-08-15 DIAGNOSIS — E1151 Type 2 diabetes mellitus with diabetic peripheral angiopathy without gangrene: Secondary | ICD-10-CM

## 2018-08-15 DIAGNOSIS — I2782 Chronic pulmonary embolism: Secondary | ICD-10-CM

## 2018-08-15 DIAGNOSIS — G629 Polyneuropathy, unspecified: Secondary | ICD-10-CM | POA: Diagnosis not present

## 2018-08-15 DIAGNOSIS — I10 Essential (primary) hypertension: Secondary | ICD-10-CM

## 2018-08-15 DIAGNOSIS — Z8673 Personal history of transient ischemic attack (TIA), and cerebral infarction without residual deficits: Secondary | ICD-10-CM

## 2018-08-15 DIAGNOSIS — F250 Schizoaffective disorder, bipolar type: Secondary | ICD-10-CM

## 2018-08-15 NOTE — Progress Notes (Signed)
Location:  [place] Room Number: 212-A Place of Service:  SNF (31) Provider:  [name], NP  Patient Care Team: Diamond Hose, MD as PCP - General (Internal Medicine)  Extended Emergency Contact Information Primary Emergency Contact: Diamond Rice, [place] of [place] Phone: [phone] Relation: Brother Secondary Emergency Contact: Diamond Rice Mobile Phone: [phone] Relation: Other  Code Status:  Full Code  Goals of care: Advanced Directive information Advanced Directives 07/05/2018  Does Patient Have a Medical Advance Directive? No  Would patient like information on creating a medical advance directive? -  Some encounter information is confidential and restricted. Go to Review Flowsheets activity to see all data.     Chief Complaint  Patient presents with  . Medical Management of Chronic Issues    Routine [facility] visit    HPI:  Pt is a 47 y.o. female seen today for medical management of chronic diseases.  She is a short-term rehabilitation resident of long-term care resident of [facility].  She has a PMH of schizoaffective disorder, DM, sleep apnea, history of stroke, essential hypertension, COPD, and anxiety disorder. She was seen in the room today. She was noted to be sleepy and reported just taking the Neurontin. She said that Neurontin makes her sleepy. Her pain level is 0/10 and has not had taken any Oxycodone for the past week. She was seen wlking to the bathroom with a walker.   Past Medical History:  Diagnosis Date  . Anxiety   . Chronic lower back pain 04/17/2012   "just got over some; catched when I walked"  . COPD (chronic obstructive pulmonary disease) ([place])   . Critical lower limb ischemia 05/27/2018  . Depression   . Headache(784.0) 04/17/2012   "~ qod; lately waking up in am w/one"  . High cholesterol   . Hypertension   . Migraines 04/17/2012  .  Obesity   . Sleep apnea    CPAP  . Stroke ([place])   . Type II diabetes mellitus ([place]) 08/28/2002   Past Surgical History:  Procedure Laterality Date  . ABDOMINAL AORTOGRAM W/LOWER EXTREMITY Left 05/27/2018   Procedure: ABDOMINAL AORTOGRAM W/LOWER EXTREMITY Runoff and Possible Intervention;  Surgeon: Waynetta Sandy, MD;  Location: [place] CV LAB;  Service: Cardiovascular;  Laterality: Left;  . APPLICATION OF WOUND VAC Left 06/06/2018   Procedure: APPLICATION OF WOUND VAC;  Surgeon: Waynetta Sandy, MD;  Location: [place];  Service: Vascular;  Laterality: Left;  . BIOPSY  04/04/2018   Procedure: BIOPSY;  Surgeon: [name], MD;  Location: [facility] ENDOSCOPY;  Service: Endoscopy;;  . CARDIAC CATHETERIZATION  ~ 2011  . COLONOSCOPY N/A 04/04/2018   Procedure: COLONOSCOPY;  Surgeon: [name], MD;  Location: [facility] ENDOSCOPY;  Service: Endoscopy;  Laterality: N/A;  . LOWER EXTREMITY ANGIOGRAM Left 05/27/2018  . PERIPHERAL VASCULAR INTERVENTION Left 05/27/2018   Procedure: PERIPHERAL VASCULAR INTERVENTION;  Surgeon: Waynetta Sandy, MD;  Location: [place] CV LAB;  Service: Cardiovascular;  Laterality: Left;  . POLYPECTOMY  04/04/2018   Procedure: POLYPECTOMY;  Surgeon: [name], MD;  Location: [facility] ENDOSCOPY;  Service: Endoscopy;;  . TRANSMETATARSAL AMPUTATION Left 05/28/2018   Procedure: AMPUTATION TOES THREE, FOUR AND FIVE on left foot;  Surgeon: Waynetta Sandy, MD;  Location: [place];  Service: Vascular;  Laterality: Left;  . WOUND DEBRIDEMENT Left 06/06/2018   Procedure: DEBRIDEMENT WOUND LEFT FOOT;  Surgeon: Waynetta Sandy,  MD;  Location: [place];  Service: Vascular;  Laterality: Left;    Allergies  Allergen Reactions  . Morphine And Related Hives  . Metformin Diarrhea    Outpatient Encounter Medications as of 08/15/2018  Medication Sig  . ARIPiprazole (ABILIFY) 15 MG tablet Take 1 tablet (15 mg total) by mouth at bedtime.  . cloNIDine  (CATAPRES) 0.3 MG tablet Take 1 tablet (0.3 mg total) by mouth 3 (three) times daily.  . clopidogrel (PLAVIX) 75 MG tablet Take 1 tablet (75 mg total) by mouth daily.  . ferrous sulfate 325 (65 FE) MG tablet Take 325 mg by mouth 2 (two) times daily with a meal.  . furosemide (LASIX) 40 MG tablet Take 40 mg by mouth daily.  Marland Kitchen gabapentin (NEURONTIN) 100 MG capsule Take 100 mg by mouth See admin instructions. 100 mg at 1 pm and 100 mg qhs  . hydrALAZINE (APRESOLINE) 50 MG tablet Take 1 tablet (50 mg total) by mouth every 8 (eight) hours.  . Insulin Detemir (LEVEMIR) 100 UNIT/ML Pen Inject 15 Units into the skin 2 (two) times daily.   Marland Kitchen lactulose (CHRONULAC) 10 GM/15ML solution Take 20 g by mouth daily.  Marland Kitchen lisinopril (PRINIVIL,ZESTRIL) 30 MG tablet Take 1 tablet (30 mg total) by mouth daily.  . Melatonin 3 MG TABS Take 3 mg by mouth at bedtime.   . Menthol, Topical Analgesic, (BIOFREEZE) 4 % GEL Apply 1 application topically every 6 (six) hours as needed (Moderate pain).  . metoprolol tartrate (LOPRESSOR) 25 MG tablet Take 25 mg by mouth 2 (two) times daily.  Marland Kitchen oxycodone (OXY-IR) 5 MG capsule Take 5 mg by mouth every 6 (six) hours as needed for pain.   . pentoxifylline (TRENTAL) 400 MG CR tablet Take 400 mg by mouth 3 (three) times daily with meals.   . polyethylene glycol (MIRALAX / GLYCOLAX) packet Take 17 g by mouth daily.  . potassium chloride (K-DUR) 10 MEQ tablet Take 2 tablets (20 mEq total) by mouth daily.  . Rivaroxaban 15 & 20 MG TBPK Take as directed on package: Start with one 15mg  tablet by mouth twice a day with food. On Day 22, switch to one 20mg  tablet once a day with food.  . sennosides-docusate sodium (SENOKOT-S) 8.6-50 MG tablet Take 2 tablets by mouth 2 (two) times daily.   No facility-administered encounter medications on file as of 08/15/2018.     Review of Systems  GENERAL: No change in appetite, no fatigue, no weight changes, no fever, chills or weakness MOUTH and THROAT:  Denies oral discomfort, gingival pain or bleeding, pain from teeth or hoarseness   RESPIRATORY: no cough, SOB, DOE, wheezing, hemoptysis CARDIAC: No chest pain, edema or palpitations GI: No abdominal pain, diarrhea, constipation, heart burn, nausea or vomiting GU: Denies dysuria, frequency, hematuria, incontinence, or discharge NEUROLOGICAL: +somnolence PSYCHIATRIC: Denies feelings of depression or anxiety. No report of hallucinations, insomnia, paranoia, or agitation    Immunization History  Administered Date(s) Administered  . Influenza,inj,Quad PF,6+ Mos 05/24/2013, 08/29/2014, 10/08/2015  . Influenza-Unspecified 06/22/2016, 07/01/2018  . PPD Test 11/10/2016  . Pneumococcal Polysaccharide-23 04/18/2012, 10/08/2015, 02/14/2017   Pertinent  Health Maintenance Due  Topic Date Due  . PAP SMEAR-Modifier  05/06/1993  . OPHTHALMOLOGY EXAM  08/13/2014  . FOOT EXAM  10/15/2015  . HEMOGLOBIN A1C  12/22/2018  . INFLUENZA VACCINE  Completed   Fall Risk  12/07/2014 10/14/2014  Falls in the past year? No No     Vitals:   08/15/18 1035  BP:  122/78  Pulse: 82  Resp: 20  Temp: (!) 97.2 F (36.2 C)  TempSrc: Oral  Weight: 195 lb 12.8 oz (88.8 kg)  Height: 5\' 1"  (1.549 m)   Body mass index is 37 kg/m.  Physical Exam  GENERAL APPEARANCE: Well nourished. In no acute distress. Obese SKIN:  Left foot  has dressing (S/P amputation of left 3rd, 4th and 5th toes) MOUTH and THROAT: Lips are without lesions. Oral mucosa is moist and without lesions.  RESPIRATORY: Breathing is even & unlabored, BS CTAB CARDIAC: RRR, no murmur,no extra heart sounds, no edema GI: Abdomen soft, normal BS, no masses, no tenderness NEUROLOGICAL: There is no tremor. Speech is clear. Sleepy. Alert and oriented X 3. Left-sided weakness PSYCHIATRIC:  Affect and behavior are appropriate  Labs reviewed: 08/14/18   WBC 5.9 hemoglobin 9.4 hematocrit 29.2 MCV 71.7 platelet 298 Recent Labs    05/22/18 1309   06/23/18 0303 06/24/18 0333 06/25/18 0305  NA 141   < > 134* 135 136  K 2.7*   < > 3.4* 3.3* 3.5  CL 99   < > 104 104 103  CO2 29   < > 24 26 25   GLUCOSE 181*   < > 173* 165* 165*  BUN 15   < > 8 8 12   CREATININE 1.61*   < > 1.10* 1.12* 1.19*  CALCIUM 9.6   < > 9.2 9.0 9.4  MG 2.0  --   --   --   --    < > = values in this interval not displayed.   Recent Labs    03/17/18 0554 05/22/18 1309 06/22/18 1230  AST 12* 13* 14*  ALT 10 9 9   ALKPHOS 64 51 72  BILITOT 0.6 0.7 0.7  PROT 7.6 7.8 7.5  ALBUMIN 3.5 3.7 3.3*   Recent Labs    06/13/18 06/17/18  06/22/18 1230  06/23/18 0303 06/24/18 0333 06/25/18 0305  WBC 7.6 8.7   < > 6.9  --  10.6* 8.8 8.2  NEUTROABS 4 4  --  3.7  --   --   --   --   HGB 8.0* 8.1*   < > 9.0*   < > 8.0* 7.6* 8.0*  HCT 25* 26*   < > 30.2*   < > 25.8* 25.0* 25.9*  MCV  --   --    < > 73.7*  --  71.7* 71.8* 71.7*  PLT 427* 544*   < > 635*  --  569* 511* 530*   < > = values in this interval not displayed.   Lab Results  Component Value Date   TSH 0.705 04/02/2018   Lab Results  Component Value Date   HGBA1C 6.8 (H) 06/22/2018   Lab Results  Component Value Date   CHOL 119 04/03/2018   HDL 29 (L) 04/03/2018   LDLCALC 72 04/03/2018   TRIG 90 04/03/2018   CHOLHDL 4.1 04/03/2018     Assessment/Plan  1. Neuropathy - noted to be sleepy after taking Neurontin 100 mg at 1 PM, will discontinue Neurontin 100 mg  At 1 PM and continue 1 capsule Q HS, has not used Oxycodone PRN for the past week, will decrease Oxycodone 5 mg 1 tab Q 6 hours PRN to Q 8 hours PRN   2. DM (diabetes mellitus) with peripheral vascular complication (HCC) - stable, will continue Levemir 100 units/ml inject 15 units SQ BID, Pentoxifylline ER 400 mg 1 tab TID with meals Lab Results  Component Value Date   HGBA1C 6.8 (H) 06/22/2018     3. Other chronic pulmonary embolism without acute cor pulmonale (HCC) -no SOB, continue Plavix 75 mg 1 tab daily, Xarelto 20 mg 1 tab  daily   4. Microcytic anemia -  hgb  9.4, improved, will decrease FeSO4 325 mg BID to 1 tab daily   5. Essential hypertension -  Stable, recently increased dosage of Hydralazine, Lisinopril 30 mg 1 tab Q D, Hydralazine 50 mg QID, Clonidine 0.3 mg 1 tab TID, Metoprolol Succinate ER 25 mg 1 tab BID, Lasix 40 mg daily   6. History of CVA - with left-sided weakness , stable, will continue Plavix 75 mg daily,Lisinopril 30 mg 1 tab Q D, Hydralazine 50 mg QID, Clonidine 0.3 mg 1 tab TID, Metoprolol Succinate ER 25 mg 1 tab BID   7. Schizoaffective disorder, bipolar type - mood is stable, will continue Aripiprazole 15 mg 1 tab Q HS, psych consult with [facility] psych NP     Family/ staff Communication:  Discussed plan of care with patient.  Labs/tests ordered:  None  Goals of care:   Short-term rehabilitation.   Diamond Rice Age, NP [facility] and Adult Medicine [phone] (Monday-Friday 8:00 a.m. - 5:00 p.m.) [phone] (after hours)

## 2018-08-26 ENCOUNTER — Non-Acute Institutional Stay (SKILLED_NURSING_FACILITY): Payer: Medicare Other | Admitting: Adult Health

## 2018-08-26 ENCOUNTER — Encounter: Payer: Self-pay | Admitting: Adult Health

## 2018-08-26 DIAGNOSIS — E1151 Type 2 diabetes mellitus with diabetic peripheral angiopathy without gangrene: Secondary | ICD-10-CM | POA: Diagnosis not present

## 2018-08-26 DIAGNOSIS — I2782 Chronic pulmonary embolism: Secondary | ICD-10-CM

## 2018-08-26 DIAGNOSIS — Z89422 Acquired absence of other left toe(s): Secondary | ICD-10-CM

## 2018-08-26 DIAGNOSIS — F250 Schizoaffective disorder, bipolar type: Secondary | ICD-10-CM

## 2018-08-26 DIAGNOSIS — I119 Hypertensive heart disease without heart failure: Secondary | ICD-10-CM

## 2018-08-26 DIAGNOSIS — D509 Iron deficiency anemia, unspecified: Secondary | ICD-10-CM

## 2018-08-26 DIAGNOSIS — G629 Polyneuropathy, unspecified: Secondary | ICD-10-CM

## 2018-08-26 DIAGNOSIS — Z8673 Personal history of transient ischemic attack (TIA), and cerebral infarction without residual deficits: Secondary | ICD-10-CM

## 2018-08-26 DIAGNOSIS — K5909 Other constipation: Secondary | ICD-10-CM

## 2018-08-26 MED ORDER — HYDRALAZINE HCL 50 MG PO TABS: 50.0000 mg | ORAL_TABLET | Freq: Four times a day (QID) | ORAL | 0 refills | Status: DC

## 2018-08-26 MED ORDER — OXYCODONE HCL 5 MG PO CAPS: 5.0000 mg | ORAL_CAPSULE | Freq: Three times a day (TID) | ORAL | 0 refills | Status: DC | PRN

## 2018-08-26 MED ORDER — FERROUS SULFATE 325 (65 FE) MG PO TABS: 325.0000 mg | ORAL_TABLET | Freq: Every day | ORAL | 0 refills | Status: DC

## 2018-08-26 MED ORDER — METOPROLOL TARTRATE 25 MG PO TABS: 25.0000 mg | ORAL_TABLET | Freq: Two times a day (BID) | ORAL | 0 refills | Status: DC

## 2018-08-26 MED ORDER — CLOPIDOGREL BISULFATE 75 MG PO TABS: 75.0000 mg | ORAL_TABLET | Freq: Every day | ORAL | 0 refills | Status: DC

## 2018-08-26 MED ORDER — LACTULOSE 10 GM/15ML PO SOLN: 20.0000 g | Freq: Every day | ORAL | 0 refills | Status: DC

## 2018-08-26 MED ORDER — ARIPIPRAZOLE 15 MG PO TABS: 15.0000 mg | ORAL_TABLET | Freq: Every day | ORAL | 0 refills | Status: DC

## 2018-08-26 MED ORDER — FUROSEMIDE 40 MG PO TABS: 40.0000 mg | ORAL_TABLET | Freq: Every day | ORAL | 0 refills | Status: DC

## 2018-08-26 MED ORDER — POTASSIUM CHLORIDE CRYS ER 20 MEQ PO TBCR: 20.0000 meq | EXTENDED_RELEASE_TABLET | Freq: Every day | ORAL | 0 refills | Status: DC

## 2018-08-26 MED ORDER — CLONIDINE HCL 0.3 MG PO TABS: 0.3000 mg | ORAL_TABLET | Freq: Three times a day (TID) | ORAL | 0 refills | Status: DC

## 2018-08-26 MED ORDER — MENTHOL (TOPICAL ANALGESIC) 4 % EX GEL: 1.0000 "application " | Freq: Four times a day (QID) | CUTANEOUS | 0 refills | Status: DC | PRN

## 2018-08-26 MED ORDER — GABAPENTIN 100 MG PO CAPS: 100.0000 mg | ORAL_CAPSULE | Freq: Two times a day (BID) | ORAL | 0 refills | Status: DC

## 2018-08-26 MED ORDER — INSULIN DETEMIR 100 UNIT/ML FLEXPEN: 15.0000 [IU] | PEN_INJECTOR | Freq: Two times a day (BID) | SUBCUTANEOUS | 0 refills | Status: DC

## 2018-08-26 MED ORDER — RIVAROXABAN 20 MG PO TABS: 20.0000 mg | ORAL_TABLET | Freq: Every day | ORAL | 0 refills | Status: DC

## 2018-08-26 MED ORDER — PENTOXIFYLLINE ER 400 MG PO TBCR: 400.0000 mg | EXTENDED_RELEASE_TABLET | Freq: Three times a day (TID) | ORAL | 0 refills | Status: DC

## 2018-08-26 MED ORDER — LISINOPRIL 30 MG PO TABS: 30.0000 mg | ORAL_TABLET | Freq: Every day | ORAL | 0 refills | Status: DC

## 2018-08-26 NOTE — Progress Notes (Signed)
Location:  [name] Room Number: 212-A Place of Service:  SNF (31) Provider:  [name], NP  Patient Care Team: [name], MD as PCP - General (Internal Medicine)  Extended Emergency Contact Information Primary Emergency Contact: Diamond [name], [place] [name]: [phone] Relation: Brother Secondary Emergency Contact: Diamond [name]: [phone] Relation: Other  Code Status:  Full Code  Goals of care: Advanced Directive information Advanced Directives 07/05/2018  Does Patient Have a Medical Advance Directive? No  Would patient like information on creating a medical advance directive? -  Some encounter information is confidential and restricted. Go to Review Flowsheets activity to see all data.     Chief Complaint  Patient presents with  . Discharge Note    Patient is discharging home on 08/27/18    HPI:  Pt is a 47 y.o. [name] seen today for discharge. She is to discharge home on 08/27/18 with home health OT, PT, and Nursing services for wound management.    She was admitted to [facility] on 06/25/18 from a recent hospitalization due to acute lower lobe PE.  She had recently undergone a left 3rd to 5th digit toe amputation on 05/28/2018.  She was started on heparin drip in the ED and was transitioned to Xarelto 15 mg twice daily and aspirin.  After discussion with vascular surgeon, aspirin was changed to Plavix to decrease bleeding risk.  Plavix and Xarelto will be continued for 3 months. She has a PMH of schizoaffective disorder, DM, sleep apnea, history of stroke, essential hypertension, COPD, and anxiety disorder.    Patient was admitted to this facility for short-term rehabilitation after the patient's recent hospitalization.  Patient has completed SNF rehabilitation and therapy has cleared the patient for discharge.   Past Medical History:    Diagnosis Date  . Anxiety   . Chronic lower back pain 04/17/2012   "just got over some; catched when I walked"  . COPD (chronic obstructive pulmonary disease) ([place])   . Critical lower limb ischemia 05/27/2018  . Depression   . Headache(784.0) 04/17/2012   "~ qod; lately waking up in am w/one"  . High cholesterol   . Hypertension   . Migraines 04/17/2012  . Obesity   . Sleep apnea    CPAP  . Stroke ([place])   . Type II diabetes mellitus ([place]) 08/28/2002   Past Surgical History:  Procedure Laterality Date  . ABDOMINAL AORTOGRAM W/LOWER EXTREMITY Left 05/27/2018   Procedure: ABDOMINAL AORTOGRAM W/LOWER EXTREMITY Runoff and Possible Intervention;  Surgeon: Waynetta Sandy, MD;  Location: [facility] CV [facility];  Service: Cardiovascular;  Laterality: Left;  . APPLICATION OF WOUND VAC Left 06/06/2018   Procedure: APPLICATION OF WOUND VAC;  Surgeon: Waynetta Sandy, MD;  Location: [facility];  Service: Vascular;  Laterality: Left;  . BIOPSY  04/04/2018   Procedure: BIOPSY;  Surgeon: [name], MD;  Location: [facility] ENDOSCOPY;  Service: Endoscopy;;  . CARDIAC CATHETERIZATION  ~ 2011  . COLONOSCOPY N/A 04/04/2018   Procedure: COLONOSCOPY;  Surgeon: [name], MD;  Location: [facility] ENDOSCOPY;  Service: Endoscopy;  Laterality: N/A;  . LOWER EXTREMITY ANGIOGRAM Left 05/27/2018  . PERIPHERAL VASCULAR INTERVENTION Left 05/27/2018   Procedure: PERIPHERAL VASCULAR INTERVENTION;  Surgeon: Waynetta Sandy, MD;  Location: [facility] CV LAB;  Service: Cardiovascular;  Laterality: Left;  . POLYPECTOMY  04/04/2018   Procedure: POLYPECTOMY;  Surgeon: [name],  MD;  Location: [facility];  Service: Endoscopy;;  . TRANSMETATARSAL AMPUTATION Left 05/28/2018   Procedure: AMPUTATION TOES THREE, FOUR AND FIVE on left foot;  Surgeon: Waynetta Sandy, MD;  Location: [facility];  Service: Vascular;  Laterality: Left;  . WOUND DEBRIDEMENT Left 06/06/2018   Procedure: DEBRIDEMENT WOUND LEFT  FOOT;  Surgeon: Waynetta Sandy, MD;  Location: [facility];  Service: Vascular;  Laterality: Left;    Allergies  Allergen Reactions  . Morphine And Related Hives  . Metformin Diarrhea    Outpatient Encounter Medications as of 08/26/2018  Medication Sig  . ARIPiprazole (ABILIFY) 15 MG tablet Take 1 tablet (15 mg total) by mouth at bedtime.  . cloNIDine (CATAPRES) 0.3 MG tablet Take 1 tablet (0.3 mg total) by mouth 3 (three) times daily.  . clopidogrel (PLAVIX) 75 MG tablet Take 1 tablet (75 mg total) by mouth daily.  . ferrous sulfate 325 (65 FE) MG tablet Take 1 tablet (325 mg total) by mouth daily with breakfast.  . furosemide (LASIX) 40 MG tablet Take 1 tablet (40 mg total) by mouth daily.  Marland Kitchen gabapentin (NEURONTIN) 100 MG capsule Take 1 capsule (100 mg total) by mouth 2 (two) times daily. 100 mg at 1 pm and 100 mg qhs  . hydrALAZINE (APRESOLINE) 50 MG tablet Take 1 tablet (50 mg total) by mouth 4 (four) times daily.  . Insulin Detemir (LEVEMIR) 100 UNIT/ML Pen Inject 15 Units into the skin 2 (two) times daily.  Marland Kitchen lactulose (CHRONULAC) 10 GM/15ML solution Take 30 mLs (20 g total) by mouth daily.  Marland Kitchen lisinopril (PRINIVIL,ZESTRIL) 30 MG tablet Take 1 tablet (30 mg total) by mouth daily.  . Melatonin 3 MG TABS Take 3 mg by mouth at bedtime.   . Menthol, Topical Analgesic, (BIOFREEZE) 4 % GEL Apply 1 application topically every 6 (six) hours as needed (Moderate pain).  . metoprolol tartrate (LOPRESSOR) 25 MG tablet Take 1 tablet (25 mg total) by mouth 2 (two) times daily.  Marland Kitchen oxycodone (OXY-IR) 5 MG capsule Take 1 capsule (5 mg total) by mouth every 8 (eight) hours as needed for pain.  . pentoxifylline (TRENTAL) 400 MG CR tablet Take 1 tablet (400 mg total) by mouth 3 (three) times daily with meals.  . polyethylene glycol (MIRALAX / GLYCOLAX) packet Take 17 g by mouth daily.  . potassium chloride SA (K-DUR,KLOR-CON) 20 MEQ tablet Take 1 tablet (20 mEq total) by mouth daily.  . rivaroxaban  (XARELTO) 20 MG TABS tablet Take 1 tablet (20 mg total) by mouth daily with supper.  . sennosides-docusate sodium (SENOKOT-S) 8.6-50 MG tablet Take 2 tablets by mouth 2 (two) times daily.  . [DISCONTINUED] ARIPiprazole (ABILIFY) 15 MG tablet Take 1 tablet (15 mg total) by mouth at bedtime.  . [DISCONTINUED] cloNIDine (CATAPRES) 0.3 MG tablet Take 1 tablet (0.3 mg total) by mouth 3 (three) times daily.  . [DISCONTINUED] clopidogrel (PLAVIX) 75 MG tablet Take 1 tablet (75 mg total) by mouth daily.  . [DISCONTINUED] ferrous sulfate 325 (65 FE) MG tablet Take 325 mg by mouth daily with breakfast.   . [DISCONTINUED] furosemide (LASIX) 40 MG tablet Take 40 mg by mouth daily.  . [DISCONTINUED] gabapentin (NEURONTIN) 100 MG capsule Take 100 mg by mouth See admin instructions. 100 mg at 1 pm and 100 mg qhs  . [DISCONTINUED] hydrALAZINE (APRESOLINE) 50 MG tablet Take 50 mg by mouth 4 (four) times daily.  . [DISCONTINUED] Insulin Detemir (LEVEMIR) 100 UNIT/ML Pen Inject 15 Units into the skin  2 (two) times daily.   . [DISCONTINUED] lactulose (CHRONULAC) 10 GM/15ML solution Take 20 g by mouth daily.  . [DISCONTINUED] lisinopril (PRINIVIL,ZESTRIL) 30 MG tablet Take 1 tablet (30 mg total) by mouth daily.  . [DISCONTINUED] Menthol, Topical Analgesic, (BIOFREEZE) 4 % GEL Apply 1 application topically every 6 (six) hours as needed (Moderate pain).  . [DISCONTINUED] metoprolol tartrate (LOPRESSOR) 25 MG tablet Take 25 mg by mouth 2 (two) times daily.  . [DISCONTINUED] oxycodone (OXY-IR) 5 MG capsule Take 5 mg by mouth every 8 (eight) hours as needed for pain.   . [DISCONTINUED] oxycodone (OXY-IR) 5 MG capsule Take 1 capsule (5 mg total) by mouth every 8 (eight) hours as needed for pain.  . [DISCONTINUED] oxycodone (OXY-IR) 5 MG capsule Take 1 capsule (5 mg total) by mouth every 8 (eight) hours as needed for pain.  . [DISCONTINUED] pentoxifylline (TRENTAL) 400 MG CR tablet Take 400 mg by mouth 3 (three) times daily  with meals.   . [DISCONTINUED] potassium chloride SA (K-DUR,KLOR-CON) 20 MEQ tablet Take 20 mEq by mouth daily.  . [DISCONTINUED] rivaroxaban (XARELTO) 20 MG TABS tablet Take 20 mg by mouth daily with supper.  . [DISCONTINUED] hydrALAZINE (APRESOLINE) 50 MG tablet Take 1 tablet (50 mg total) by mouth every 8 (eight) hours.  . [DISCONTINUED] potassium chloride (K-DUR) 10 MEQ tablet Take 2 tablets (20 mEq total) by mouth daily.  . [DISCONTINUED] Rivaroxaban 15 & 20 MG TBPK Take as directed on package: Start with one 15mg  tablet by mouth twice a day with food. On Day 22, switch to one 20mg  tablet once a day with food.   No facility-administered encounter medications on file as of 08/26/2018.     Review of Systems  GENERAL: No change in appetite, no fatigue, no weight changes, no fever, chills or weakness MOUTH and THROAT: Denies oral discomfort, gingival pain or bleeding RESPIRATORY: no cough, SOB, DOE, wheezing, hemoptysis CARDIAC: No chest pain, edema or palpitations GI: No abdominal pain, diarrhea, constipation, heart burn, nausea or vomiting GU: Denies dysuria, frequency, hematuria, incontinence, or discharge PSYCHIATRIC: Denies feelings of depression or anxiety. No report of hallucinations, insomnia, paranoia, or agitation   Immunization History  Administered Date(s) Administered  . Influenza,inj,Quad PF,6+ Mos 05/24/2013, 08/29/2014, 10/08/2015  . Influenza-Unspecified 06/22/2016, 07/01/2018  . PPD Test 11/10/2016  . Pneumococcal Polysaccharide-23 04/18/2012, 10/08/2015, 02/14/2017   Pertinent  Health Maintenance Due  Topic Date Due  . PAP SMEAR-Modifier  05/06/1993  . OPHTHALMOLOGY EXAM  08/13/2014  . FOOT EXAM  10/15/2015  . HEMOGLOBIN A1C  12/22/2018  . INFLUENZA VACCINE  Completed   Fall Risk  12/07/2014 10/14/2014  Falls in the past year? No No    Vitals:   08/26/18 1039 08/26/18 1040  BP: (!) 141/79 (!) 142/85  Pulse: 86 70  Resp: 19   Temp: (!) 97.3 F (36.3 C)     TempSrc: Oral   Weight: 195 lb 12.8 oz (88.8 kg)   Height: 5\' 1"  (1.549 m)    Body mass index is 37 kg/m.  Physical Exam  GENERAL APPEARANCE: Well nourished. In no acute distress.Obese SKIN:  Left foot surgical wound (S/P 3rd, 4th and 5th toe amputation) with good granulation, healing  MOUTH and THROAT: Lips are without lesions. Oral mucosa is moist and without lesions.  RESPIRATORY: Breathing is even & unlabored, BS CTAB CARDIAC: RRR, no murmur,no extra heart sounds, no edema GI: Abdomen soft, normal BS, no masses, no tenderness EXTREMITIES:  Able to move X 4 extremities  NEUROLOGICAL: There is no tremor. Speech is clear. Alert and oriented X 3. PSYCHIATRIC:  Affect and behavior are appropriate   Labs reviewed: Recent Labs    05/22/18 1309  06/23/18 0303 10/Diamond/19 0333 06/25/18 0305  NA 141   < > 134* 135 136  K 2.7*   < > 3.4* 3.3* 3.5  CL 99   < > 104 104 103  CO2 29   < > 24 26 25   GLUCOSE 181*   < > 173* 165* 165*  BUN 15   < > 8 8 12   CREATININE 1.61*   < > 1.10* 1.12* 1.19*  CALCIUM 9.6   < > 9.2 9.0 9.4  MG 2.0  --   --   --   --    < > = values in this interval not displayed.   Recent Labs    03/17/18 0554 05/22/18 1309 06/22/18 1230  AST 12* 13* 14*  ALT 10 9 9   ALKPHOS 64 51 72  BILITOT 0.6 0.7 0.7  PROT 7.6 7.8 7.5  ALBUMIN 3.5 3.7 3.3*   Recent Labs    06/13/18 06/17/18  06/22/18 1230  06/23/18 0303 10/Diamond/19 0333 06/25/18 0305  WBC 7.6 8.7   < > 6.9  --  10.6* 8.8 8.2  NEUTROABS 4 4  --  3.7  --   --   --   --   HGB 8.0* 8.1*   < > 9.0*   < > 8.0* 7.6* 8.0*  HCT 25* 26*   < > 30.2*   < > 25.8* 25.0* 25.9*  MCV  --   --    < > 73.7*  --  71.7* 71.8* 71.7*  PLT 427* 544*   < > 635*  --  569* 511* 530*   < > = values in this interval not displayed.   Lab Results  Component Value Date   TSH 0.705 04/02/2018   Lab Results  Component Value Date   HGBA1C 6.8 (H) 06/22/2018   Lab Results  Component Value Date   CHOL 119 04/03/2018   HDL  29 (L) 04/03/2018   LDLCALC 72 04/03/2018   TRIG 90 04/03/2018   CHOLHDL 4.1 04/03/2018    Assessment/Plan  1. Other chronic pulmonary embolism without acute cor pulmonale (HCC) - rivaroxaban (XARELTO) 20 MG TABS tablet; Take 1 tablet (20 mg total) by mouth daily with supper.  Dispense: 30 tablet; Refill: 0  2. Hypertensive heart disease without heart failure - cloNIDine (CATAPRES) 0.3 MG tablet; Take 1 tablet (0.3 mg total) by mouth 3 (three) times daily.  Dispense: 90 tablet; Refill: 0 - furosemide (LASIX) 40 MG tablet; Take 1 tablet (40 mg total) by mouth daily.  Dispense: 30 tablet; Refill: 0 - hydrALAZINE (APRESOLINE) 50 MG tablet; Take 1 tablet (50 mg total) by mouth 4 (four) times daily.  Dispense: 120 tablet; Refill: 0 - lisinopril (PRINIVIL,ZESTRIL) 30 MG tablet; Take 1 tablet (30 mg total) by mouth daily.  Dispense: 30 tablet; Refill: 0 - metoprolol tartrate (LOPRESSOR) 25 MG tablet; Take 1 tablet (25 mg total) by mouth 2 (two) times daily.  Dispense: 60 tablet; Refill: 0 - potassium chloride SA (K-DUR,KLOR-CON) 20 MEQ tablet; Take 1 tablet (20 mEq total) by mouth daily.  Dispense: 30 tablet; Refill: 0  3. DM (diabetes mellitus) with peripheral vascular complication (HCC) - Insulin Detemir (LEVEMIR) 100 UNIT/ML Pen; Inject 15 Units into the skin 2 (two) times daily.  Dispense: 15 mL; Refill: 0 - pentoxifylline (  TRENTAL) 400 MG CR tablet; Take 1 tablet (400 mg total) by mouth 3 (three) times daily with meals.  Dispense: 90 tablet; Refill: 0  4. Schizoaffective disorder, bipolar type (HCC) - ARIPiprazole (ABILIFY) 15 MG tablet; Take 1 tablet (15 mg total) by mouth at bedtime.  Dispense: 30 tablet; Refill: 0  5. History of CVA (cerebrovascular accident) - clopidogrel (PLAVIX) 75 MG tablet; Take 1 tablet (75 mg total) by mouth daily.  Dispense: 30 tablet; Refill: 0  6. Microcytic anemia - ferrous sulfate 325 (65 FE) MG tablet; Take 1 tablet (325 mg total) by mouth daily with  breakfast.  Dispense: 30 tablet; Refill: 0  7. Neuropathy - gabapentin (NEURONTIN) 100 MG capsule; Take 1 capsule (100 mg total) by mouth 2 (two) times daily. 100 mg at 1 pm and 100 mg qhs  Dispense: 60 capsule; Refill: 0  8. Chronic constipation - lactulose (CHRONULAC) 10 GM/15ML solution; Take 30 mLs (20 g total) by mouth daily.  Dispense: 236 mL; Refill: 0  9. History of amputation of lesser toe of left foot (HCC) - oxycodone (OXY-IR) 5 MG capsule; Take 1 capsule (5 mg total) by mouth every 8 (eight) hours as needed for pain.  Dispense: 21 capsule; Refill: 0      I have filled out patient's discharge paperwork and written prescriptions.  Patient will receive home health PT, OT and Nursing for wound management..  DME provided: None  Total discharge time: Greater than 30 minutes Greater than 50% was spent in counseling and coordination of care.   Discharge time involved coordination of the discharge process with social worker, nursing staff and therapy department. Medical justification for home health services verified.    Diamond Age, NP [facility] [phone] (Monday-Friday 8:00 a.m. - 5:00 p.m.) [phone] (after hours)

## 2018-08-30 ENCOUNTER — Inpatient Hospital Stay (HOSPITAL_COMMUNITY): Payer: Medicare Other

## 2018-08-30 ENCOUNTER — Inpatient Hospital Stay (HOSPITAL_COMMUNITY)
Admission: EM | Admit: 2018-08-30 | Discharge: 2018-09-21 | DRG: 065 | Disposition: A | Discharge status: Skilled Nursing Facility | Payer: Medicare Other | Attending: Neurology | Admitting: Neurology

## 2018-08-30 ENCOUNTER — Encounter (HOSPITAL_COMMUNITY): Payer: Self-pay | Admitting: *Deleted

## 2018-08-30 ENCOUNTER — Emergency Department (HOSPITAL_COMMUNITY): Payer: Medicare Other

## 2018-08-30 DIAGNOSIS — I2782 Chronic pulmonary embolism: Secondary | ICD-10-CM | POA: Diagnosis not present

## 2018-08-30 DIAGNOSIS — Z89422 Acquired absence of other left toe(s): Secondary | ICD-10-CM | POA: Diagnosis not present

## 2018-08-30 DIAGNOSIS — I639 Cerebral infarction, unspecified: Secondary | ICD-10-CM

## 2018-08-30 DIAGNOSIS — Z823 Family history of stroke: Secondary | ICD-10-CM

## 2018-08-30 DIAGNOSIS — Z7289 Other problems related to lifestyle: Secondary | ICD-10-CM

## 2018-08-30 DIAGNOSIS — D869 Sarcoidosis, unspecified: Secondary | ICD-10-CM | POA: Diagnosis present

## 2018-08-30 DIAGNOSIS — I619 Nontraumatic intracerebral hemorrhage, unspecified: Secondary | ICD-10-CM | POA: Diagnosis not present

## 2018-08-30 DIAGNOSIS — E1169 Type 2 diabetes mellitus with other specified complication: Secondary | ICD-10-CM | POA: Diagnosis not present

## 2018-08-30 DIAGNOSIS — N179 Acute kidney failure, unspecified: Secondary | ICD-10-CM | POA: Diagnosis not present

## 2018-08-30 DIAGNOSIS — Z885 Allergy status to narcotic agent status: Secondary | ICD-10-CM

## 2018-08-30 DIAGNOSIS — Z87891 Personal history of nicotine dependence: Secondary | ICD-10-CM

## 2018-08-30 DIAGNOSIS — D631 Anemia in chronic kidney disease: Secondary | ICD-10-CM | POA: Diagnosis present

## 2018-08-30 DIAGNOSIS — Z9114 Patient's other noncompliance with medication regimen: Secondary | ICD-10-CM

## 2018-08-30 DIAGNOSIS — G8929 Other chronic pain: Secondary | ICD-10-CM | POA: Diagnosis present

## 2018-08-30 DIAGNOSIS — R29703 NIHSS score 3: Secondary | ICD-10-CM | POA: Diagnosis present

## 2018-08-30 DIAGNOSIS — F329 Major depressive disorder, single episode, unspecified: Secondary | ICD-10-CM | POA: Diagnosis present

## 2018-08-30 DIAGNOSIS — F1910 Other psychoactive substance abuse, uncomplicated: Secondary | ICD-10-CM | POA: Diagnosis not present

## 2018-08-30 DIAGNOSIS — J449 Chronic obstructive pulmonary disease, unspecified: Secondary | ICD-10-CM

## 2018-08-30 DIAGNOSIS — I610 Nontraumatic intracerebral hemorrhage in hemisphere, subcortical: Principal | ICD-10-CM | POA: Diagnosis present

## 2018-08-30 DIAGNOSIS — N390 Urinary tract infection, site not specified: Secondary | ICD-10-CM | POA: Diagnosis not present

## 2018-08-30 DIAGNOSIS — B964 Proteus (mirabilis) (morganii) as the cause of diseases classified elsewhere: Secondary | ICD-10-CM | POA: Diagnosis not present

## 2018-08-30 DIAGNOSIS — I611 Nontraumatic intracerebral hemorrhage in hemisphere, cortical: Secondary | ICD-10-CM | POA: Diagnosis not present

## 2018-08-30 DIAGNOSIS — E1165 Type 2 diabetes mellitus with hyperglycemia: Secondary | ICD-10-CM | POA: Diagnosis present

## 2018-08-30 DIAGNOSIS — Z89429 Acquired absence of other toe(s), unspecified side: Secondary | ICD-10-CM

## 2018-08-30 DIAGNOSIS — R509 Fever, unspecified: Secondary | ICD-10-CM | POA: Diagnosis not present

## 2018-08-30 DIAGNOSIS — G629 Polyneuropathy, unspecified: Secondary | ICD-10-CM | POA: Diagnosis present

## 2018-08-30 DIAGNOSIS — K5909 Other constipation: Secondary | ICD-10-CM | POA: Diagnosis present

## 2018-08-30 DIAGNOSIS — I161 Hypertensive emergency: Secondary | ICD-10-CM | POA: Diagnosis present

## 2018-08-30 DIAGNOSIS — Z6841 Body Mass Index (BMI) 40.0 and over, adult: Secondary | ICD-10-CM

## 2018-08-30 DIAGNOSIS — G8194 Hemiplegia, unspecified affecting left nondominant side: Secondary | ICD-10-CM | POA: Diagnosis present

## 2018-08-30 DIAGNOSIS — Z86711 Personal history of pulmonary embolism: Secondary | ICD-10-CM

## 2018-08-30 DIAGNOSIS — I129 Hypertensive chronic kidney disease with stage 1 through stage 4 chronic kidney disease, or unspecified chronic kidney disease: Secondary | ICD-10-CM | POA: Diagnosis present

## 2018-08-30 DIAGNOSIS — D638 Anemia in other chronic diseases classified elsewhere: Secondary | ICD-10-CM

## 2018-08-30 DIAGNOSIS — E785 Hyperlipidemia, unspecified: Secondary | ICD-10-CM | POA: Diagnosis present

## 2018-08-30 DIAGNOSIS — I5189 Other ill-defined heart diseases: Secondary | ICD-10-CM

## 2018-08-30 DIAGNOSIS — Z7902 Long term (current) use of antithrombotics/antiplatelets: Secondary | ICD-10-CM

## 2018-08-30 DIAGNOSIS — E669 Obesity, unspecified: Secondary | ICD-10-CM

## 2018-08-30 DIAGNOSIS — Z888 Allergy status to other drugs, medicaments and biological substances status: Secondary | ICD-10-CM

## 2018-08-30 DIAGNOSIS — Z9989 Dependence on other enabling machines and devices: Secondary | ICD-10-CM

## 2018-08-30 DIAGNOSIS — I10 Essential (primary) hypertension: Secondary | ICD-10-CM | POA: Diagnosis not present

## 2018-08-30 DIAGNOSIS — E1151 Type 2 diabetes mellitus with diabetic peripheral angiopathy without gangrene: Secondary | ICD-10-CM | POA: Diagnosis present

## 2018-08-30 DIAGNOSIS — B9620 Unspecified Escherichia coli [E. coli] as the cause of diseases classified elsewhere: Secondary | ICD-10-CM | POA: Diagnosis not present

## 2018-08-30 DIAGNOSIS — F1410 Cocaine abuse, uncomplicated: Secondary | ICD-10-CM | POA: Diagnosis present

## 2018-08-30 DIAGNOSIS — Z7901 Long term (current) use of anticoagulants: Secondary | ICD-10-CM

## 2018-08-30 DIAGNOSIS — Z79899 Other long term (current) drug therapy: Secondary | ICD-10-CM

## 2018-08-30 DIAGNOSIS — F250 Schizoaffective disorder, bipolar type: Secondary | ICD-10-CM | POA: Diagnosis present

## 2018-08-30 DIAGNOSIS — N182 Chronic kidney disease, stage 2 (mild): Secondary | ICD-10-CM | POA: Diagnosis not present

## 2018-08-30 DIAGNOSIS — E7800 Pure hypercholesterolemia, unspecified: Secondary | ICD-10-CM | POA: Diagnosis present

## 2018-08-30 DIAGNOSIS — F1290 Cannabis use, unspecified, uncomplicated: Secondary | ICD-10-CM | POA: Diagnosis present

## 2018-08-30 DIAGNOSIS — E1122 Type 2 diabetes mellitus with diabetic chronic kidney disease: Secondary | ICD-10-CM | POA: Diagnosis present

## 2018-08-30 DIAGNOSIS — G4733 Obstructive sleep apnea (adult) (pediatric): Secondary | ICD-10-CM | POA: Diagnosis present

## 2018-08-30 DIAGNOSIS — Z8249 Family history of ischemic heart disease and other diseases of the circulatory system: Secondary | ICD-10-CM

## 2018-08-30 DIAGNOSIS — I351 Nonrheumatic aortic (valve) insufficiency: Secondary | ICD-10-CM | POA: Diagnosis present

## 2018-08-30 DIAGNOSIS — Z86010 Personal history of colonic polyps: Secondary | ICD-10-CM

## 2018-08-30 DIAGNOSIS — I70209 Unspecified atherosclerosis of native arteries of extremities, unspecified extremity: Secondary | ICD-10-CM | POA: Diagnosis present

## 2018-08-30 DIAGNOSIS — R29810 Facial weakness: Secondary | ICD-10-CM | POA: Diagnosis present

## 2018-08-30 DIAGNOSIS — Z8673 Personal history of transient ischemic attack (TIA), and cerebral infarction without residual deficits: Secondary | ICD-10-CM

## 2018-08-30 DIAGNOSIS — E119 Type 2 diabetes mellitus without complications: Secondary | ICD-10-CM

## 2018-08-30 LAB — DIFFERENTIAL
Abs Immature Granulocytes: 0.02 10*3/uL (ref 0.00–0.07)
BASOS ABS: 0.1 10*3/uL (ref 0.0–0.1)
Basophils Relative: 1 %
Eosinophils Absolute: 0.2 10*3/uL (ref 0.0–0.5)
Eosinophils Relative: 2 %
Immature Granulocytes: 0 %
Lymphocytes Relative: 43 %
Lymphs Abs: 3.6 10*3/uL (ref 0.7–4.0)
Monocytes Absolute: 0.6 10*3/uL (ref 0.1–1.0)
Monocytes Relative: 8 %
Neutro Abs: 3.9 10*3/uL (ref 1.7–7.7)
Neutrophils Relative %: 46 %

## 2018-08-30 LAB — PROTIME-INR
INR: 1.11
Prothrombin Time: 14.2 seconds (ref 11.4–15.2)

## 2018-08-30 LAB — I-STAT TROPONIN, ED: Troponin i, poc: 0 ng/mL (ref 0.00–0.08)

## 2018-08-30 LAB — I-STAT CHEM 8, ED
BUN: 13 mg/dL (ref 6–20)
Calcium, Ion: 1.22 mmol/L (ref 1.15–1.40)
Chloride: 108 mmol/L (ref 98–111)
Creatinine, Ser: 1.1 mg/dL — ABNORMAL HIGH (ref 0.44–1.00)
Glucose, Bld: 135 mg/dL — ABNORMAL HIGH (ref 70–99)
HEMATOCRIT: 34 % — AB (ref 36.0–46.0)
Hemoglobin: 11.6 g/dL — ABNORMAL LOW (ref 12.0–15.0)
Potassium: 3.6 mmol/L (ref 3.5–5.1)
SODIUM: 143 mmol/L (ref 135–145)
TCO2: 26 mmol/L (ref 22–32)

## 2018-08-30 LAB — COMPREHENSIVE METABOLIC PANEL
ALT: 10 U/L (ref 0–44)
ANION GAP: 9 (ref 5–15)
AST: 17 U/L (ref 15–41)
Albumin: 3.3 g/dL — ABNORMAL LOW (ref 3.5–5.0)
Alkaline Phosphatase: 73 U/L (ref 38–126)
BUN: 9 mg/dL (ref 6–20)
CO2: 24 mmol/L (ref 22–32)
Calcium: 9.5 mg/dL (ref 8.9–10.3)
Chloride: 108 mmol/L (ref 98–111)
Creatinine, Ser: 1.24 mg/dL — ABNORMAL HIGH (ref 0.44–1.00)
GFR calc non Af Amer: 52 mL/min — ABNORMAL LOW (ref >60)
Glucose, Bld: 139 mg/dL — ABNORMAL HIGH (ref 70–99)
Potassium: 3.6 mmol/L (ref 3.5–5.1)
Sodium: 141 mmol/L (ref 135–145)
Total Bilirubin: 0.4 mg/dL (ref 0.3–1.2)
Total Protein: 7.8 g/dL (ref 6.5–8.1)

## 2018-08-30 LAB — CBC
HCT: 35 % — ABNORMAL LOW (ref 36.0–46.0)
Hemoglobin: 10.2 g/dL — ABNORMAL LOW (ref 12.0–15.0)
MCH: 21.6 pg — ABNORMAL LOW (ref 26.0–34.0)
MCHC: 29.1 g/dL — AB (ref 30.0–36.0)
MCV: 74.2 fL — ABNORMAL LOW (ref 80.0–100.0)
PLATELETS: 357 10*3/uL (ref 150–400)
RBC: 4.72 MIL/uL (ref 3.87–5.11)
RDW: 15.4 % (ref 11.5–15.5)
WBC: 8.5 10*3/uL (ref 4.0–10.5)
nRBC: 0 % (ref 0.0–0.2)

## 2018-08-30 LAB — GLUCOSE, CAPILLARY
Glucose-Capillary: 105 mg/dL — ABNORMAL HIGH (ref 70–99)
Glucose-Capillary: 162 mg/dL — ABNORMAL HIGH (ref 70–99)

## 2018-08-30 LAB — APTT: aPTT: 24 seconds (ref 24–36)

## 2018-08-30 MED ORDER — CLEVIDIPINE BUTYRATE 0.5 MG/ML IV EMUL
0.0000 mg/h | INTRAVENOUS | Status: DC
  Filled 2018-08-30 (×2): qty 50

## 2018-08-30 MED ORDER — LABETALOL HCL 5 MG/ML IV SOLN
20.0000 mg | Freq: Once | INTRAVENOUS | Status: AC
  Administered 2018-08-30: 10 mg via INTRAVENOUS
  Filled 2018-08-30: qty 4

## 2018-08-30 MED ORDER — INSULIN ASPART 100 UNIT/ML ~~LOC~~ SOLN
0.0000 [IU] | Freq: Every day | SUBCUTANEOUS | Status: DC
  Administered 2018-09-05: 2 [IU] via SUBCUTANEOUS
  Administered 2018-09-09: 3 [IU] via SUBCUTANEOUS
  Administered 2018-09-10 – 2018-09-12 (×2): 2 [IU] via SUBCUTANEOUS

## 2018-08-30 MED ORDER — ACETAMINOPHEN 325 MG PO TABS
650.0000 mg | ORAL_TABLET | ORAL | Status: DC | PRN
  Administered 2018-09-01 – 2018-09-20 (×12): 650 mg via ORAL
  Filled 2018-08-30 (×14): qty 2

## 2018-08-30 MED ORDER — PANTOPRAZOLE SODIUM 40 MG IV SOLR
40.0000 mg | Freq: Every day | INTRAVENOUS | Status: DC
  Administered 2018-08-30 – 2018-08-31 (×2): 40 mg via INTRAVENOUS
  Filled 2018-08-30 (×2): qty 40

## 2018-08-30 MED ORDER — SENNOSIDES-DOCUSATE SODIUM 8.6-50 MG PO TABS
1.0000 | ORAL_TABLET | Freq: Two times a day (BID) | ORAL | Status: DC
  Administered 2018-08-31: 1 via ORAL
  Filled 2018-08-30: qty 1

## 2018-08-30 MED ORDER — ACETAMINOPHEN 650 MG RE SUPP: 650.0000 mg | RECTAL | Status: DC | PRN

## 2018-08-30 MED ORDER — INSULIN ASPART 100 UNIT/ML ~~LOC~~ SOLN
0.0000 [IU] | Freq: Three times a day (TID) | SUBCUTANEOUS | Status: DC
  Administered 2018-08-31: 3 [IU] via SUBCUTANEOUS
  Administered 2018-08-31: 2 [IU] via SUBCUTANEOUS
  Administered 2018-08-31: 3 [IU] via SUBCUTANEOUS
  Administered 2018-09-01: 2 [IU] via SUBCUTANEOUS
  Administered 2018-09-01 – 2018-09-02 (×2): 3 [IU] via SUBCUTANEOUS
  Administered 2018-09-02: 2 [IU] via SUBCUTANEOUS
  Administered 2018-09-03 (×2): 3 [IU] via SUBCUTANEOUS
  Administered 2018-09-04: 2 [IU] via SUBCUTANEOUS
  Administered 2018-09-04: 3 [IU] via SUBCUTANEOUS
  Administered 2018-09-04: 2 [IU] via SUBCUTANEOUS
  Administered 2018-09-05: 8 [IU] via SUBCUTANEOUS
  Administered 2018-09-05 – 2018-09-06 (×3): 2 [IU] via SUBCUTANEOUS
  Administered 2018-09-06: 5 [IU] via SUBCUTANEOUS
  Administered 2018-09-07 (×3): 2 [IU] via SUBCUTANEOUS
  Administered 2018-09-08: 3 [IU] via SUBCUTANEOUS
  Administered 2018-09-09: 2 [IU] via SUBCUTANEOUS
  Administered 2018-09-09 – 2018-09-10 (×2): 3 [IU] via SUBCUTANEOUS
  Administered 2018-09-10 – 2018-09-13 (×5): 2 [IU] via SUBCUTANEOUS
  Administered 2018-09-13: 5 [IU] via SUBCUTANEOUS
  Administered 2018-09-14: 3 [IU] via SUBCUTANEOUS
  Administered 2018-09-15: 2 [IU] via SUBCUTANEOUS
  Administered 2018-09-15: 3 [IU] via SUBCUTANEOUS
  Administered 2018-09-16 – 2018-09-17 (×2): 2 [IU] via SUBCUTANEOUS
  Administered 2018-09-19: 3 [IU] via SUBCUTANEOUS

## 2018-08-30 MED ORDER — ACETAMINOPHEN 160 MG/5ML PO SOLN: 650.0000 mg | ORAL | Status: DC | PRN

## 2018-08-30 MED ORDER — STROKE: EARLY STAGES OF RECOVERY BOOK
Freq: Once | Status: DC
  Filled 2018-08-30: qty 1

## 2018-08-30 MED ORDER — CLEVIDIPINE BUTYRATE 0.5 MG/ML IV EMUL
0.0000 mg/h | INTRAVENOUS | Status: DC
  Administered 2018-08-30: 1 mg/h via INTRAVENOUS
  Administered 2018-08-30: 12 mg/h via INTRAVENOUS
  Administered 2018-08-30: 5 mg/h via INTRAVENOUS
  Administered 2018-08-30: 13 mg/h via INTRAVENOUS
  Administered 2018-08-31: 15 mg/h via INTRAVENOUS
  Administered 2018-08-31 (×2): 13 mg/h via INTRAVENOUS
  Administered 2018-08-31: 16 mg/h via INTRAVENOUS
  Administered 2018-08-31: 13 mg/h via INTRAVENOUS
  Administered 2018-08-31: 18 mg/h via INTRAVENOUS
  Administered 2018-08-31: 20 mg/h via INTRAVENOUS
  Administered 2018-08-31: 16 mg/h via INTRAVENOUS
  Administered 2018-08-31: 18 mg/h via INTRAVENOUS
  Filled 2018-08-30 (×7): qty 50
  Filled 2018-08-30: qty 100
  Filled 2018-08-30 (×3): qty 50

## 2018-08-30 NOTE — Progress Notes (Signed)
Bilateral lower extremity venous duplex completed. Refer to "CV Proc" under chart review to view preliminary results.  08/30/2018 6:16 PM Maudry Mayhew, MHA, RVT, RDCS, RDMS

## 2018-08-30 NOTE — H&P (Addendum)
NEURO HOSPITALIST  H&P      Chief Complaint: facial droop and left arm numbness  History obtained from:  Patient     HPI:                                                                                                                                         Diamond Rice is an 48 y.o. female  With PMH of PE ( 10/19 on xarelto), CVA ( 2016; residual left side weakness), HTN, DM HLD, COPD who presented to [facility] ED with c/o left arm numbness and facial droop.   patient went to bed in her usual state of health. When she woke up this morning she states that she was normal. About 0900 she had an onset of left arm numbness. Patient has not been on xarelto or plavix for 3 days ( 08/27/18), she did not have the money to pick up her prescriptions. Denies any diplopia, CP, SOB, HA,fever, abdominal pain, n/v or palpitations.   ED course:  BP: 170/98 BG: 135 Creatinine: 1.10, Hgb: 11.0 HCT: 34.0 CTH: hemorrhagic infarct in right lentiform nucleus   2/35/57: right PLIC infarct secondary to small vessel disease; left side residual deficits.  Date last known well: Date: 08/30/2018 Time last known well: Time: 09:00 tPA Given: No: ICH Modified Rankin: Rankin Score=1 NIHSS:3 drift, sensation, facial droop Intracerebral Hemorrhage (ICH) Score Total:  0  Past Medical History:  Diagnosis Date  . Anxiety   . Chronic lower back pain 04/17/2012   "just got over some; catched when I walked"  . COPD (chronic obstructive pulmonary disease) ([place])   . Critical lower limb ischemia 05/27/2018  . Depression   . Headache(784.0) 04/17/2012   "~ qod; lately waking up in am w/one"  . High cholesterol   . Hypertension   . Migraines 04/17/2012  . Obesity   . Sleep apnea    CPAP  . Stroke ([place])   . Type II diabetes mellitus ([place]) 08/28/2002    Past Surgical History:  Procedure Laterality Date  . ABDOMINAL AORTOGRAM W/LOWER EXTREMITY Left 05/27/2018   Procedure:  ABDOMINAL AORTOGRAM W/LOWER EXTREMITY Runoff and Possible Intervention;  Surgeon: Waynetta Sandy, MD;  Location: [place] CV LAB;  Service: Cardiovascular;  Laterality: Left;  . APPLICATION OF WOUND VAC Left 06/06/2018   Procedure: APPLICATION OF WOUND VAC;  Surgeon: Waynetta Sandy, MD;  Location: [place];  Service: Vascular;  Laterality: Left;  . BIOPSY  04/04/2018   Procedure: BIOPSY;  Surgeon: [name], MD;  Location: [facility] ENDOSCOPY;  Service: Endoscopy;;  . CARDIAC CATHETERIZATION  ~ 2011  . COLONOSCOPY  N/A 04/04/2018   Procedure: COLONOSCOPY;  Surgeon: [name], MD;  Location: [facility] ENDOSCOPY;  Service: Endoscopy;  Laterality: N/A;  . LOWER EXTREMITY ANGIOGRAM Left 05/27/2018  . PERIPHERAL VASCULAR INTERVENTION Left 05/27/2018   Procedure: PERIPHERAL VASCULAR INTERVENTION;  Surgeon: Waynetta Sandy, MD;  Location: [place] CV LAB;  Service: Cardiovascular;  Laterality: Left;  . POLYPECTOMY  04/04/2018   Procedure: POLYPECTOMY;  Surgeon: [name], MD;  Location: [facility] ENDOSCOPY;  Service: Endoscopy;;  . TRANSMETATARSAL AMPUTATION Left 05/28/2018   Procedure: AMPUTATION TOES THREE, FOUR AND FIVE on left foot;  Surgeon: Waynetta Sandy, MD;  Location: [place];  Service: Vascular;  Laterality: Left;  . WOUND DEBRIDEMENT Left 06/06/2018   Procedure: DEBRIDEMENT WOUND LEFT FOOT;  Surgeon: Waynetta Sandy, MD;  Location: [facility] OR;  Service: Vascular;  Laterality: Left;    Family History  Problem Relation Age of Onset  . Stroke Brother 40  . Stroke Brother 24  . Heart attack Mother 20  . Stroke Father 5         Social History:  reports that she has quit smoking. Her smoking use included cigarettes. She has a 7.00 pack-year smoking history. She has never used smokeless tobacco. She reports current drug use. Drugs: Marijuana and "Crack" cocaine. She reports that she does not drink alcohol.  Allergies:  Allergies  Allergen Reactions  .  Morphine And Related Hives  . Metformin Diarrhea    Medications:                                                                                                                           Current Facility-Administered Medications  Medication Dose Route Frequency Provider Last Rate Last Dose  .  stroke: mapping our early stages of recovery book   Does not apply Once Amie Portland, MD      . acetaminophen (TYLENOL) tablet 650 mg  650 mg Oral Q4H PRN Amie Portland, MD       Or  . acetaminophen (TYLENOL) solution 650 mg  650 mg Per Tube Q4H PRN Amie Portland, MD       Or  . acetaminophen (TYLENOL) suppository 650 mg  650 mg Rectal Q4H PRN Amie Portland, MD      . clevidipine (CLEVIPREX) infusion 0.5 mg/mL  0-21 mg/hr Intravenous Continuous Khatri, Hina, PA-C      . labetalol (NORMODYNE,TRANDATE) injection 20 mg  20 mg Intravenous Once Amie Portland, MD       And  . clevidipine (CLEVIPREX) infusion 0.5 mg/mL  0-21 mg/hr Intravenous Continuous Amie Portland, MD      . pantoprazole (PROTONIX) injection 40 mg  40 mg Intravenous QHS Amie Portland, MD      . senna-docusate (Senokot-S) tablet 1 tablet  1 tablet Oral BID Amie Portland, MD       Current Outpatient Medications  Medication Sig Dispense Refill  . ARIPiprazole (ABILIFY) 15 MG tablet Take 1 tablet (15 mg total) by mouth at  bedtime. 30 tablet 0  . cloNIDine (CATAPRES) 0.3 MG tablet Take 1 tablet (0.3 mg total) by mouth 3 (three) times daily. 90 tablet 0  . clopidogrel (PLAVIX) 75 MG tablet Take 1 tablet (75 mg total) by mouth daily. 30 tablet 0  . ferrous sulfate 325 (65 FE) MG tablet Take 1 tablet (325 mg total) by mouth daily with breakfast. 30 tablet 0  . furosemide (LASIX) 40 MG tablet Take 1 tablet (40 mg total) by mouth daily. 30 tablet 0  . gabapentin (NEURONTIN) 100 MG capsule Take 1 capsule (100 mg total) by mouth 2 (two) times daily. 100 mg at 1 pm and 100 mg qhs 60 capsule 0  . hydrALAZINE (APRESOLINE) 50 MG tablet Take 1 tablet  (50 mg total) by mouth 4 (four) times daily. 120 tablet 0  . Insulin Detemir (LEVEMIR) 100 UNIT/ML Pen Inject 15 Units into the skin 2 (two) times daily. 15 mL 0  . lactulose (CHRONULAC) 10 GM/15ML solution Take 30 mLs (20 g total) by mouth daily. 236 mL 0  . lisinopril (PRINIVIL,ZESTRIL) 30 MG tablet Take 1 tablet (30 mg total) by mouth daily. 30 tablet 0  . Melatonin 3 MG TABS Take 3 mg by mouth at bedtime.     . Menthol, Topical Analgesic, (BIOFREEZE) 4 % GEL Apply 1 application topically every 6 (six) hours as needed (Moderate pain). 89 mL 0  . metoprolol tartrate (LOPRESSOR) 25 MG tablet Take 1 tablet (25 mg total) by mouth 2 (two) times daily. 60 tablet 0  . oxycodone (OXY-IR) 5 MG capsule Take 1 capsule (5 mg total) by mouth every 8 (eight) hours as needed for pain. 21 capsule 0  . pentoxifylline (TRENTAL) 400 MG CR tablet Take 1 tablet (400 mg total) by mouth 3 (three) times daily with meals. 90 tablet 0  . polyethylene glycol (MIRALAX / GLYCOLAX) packet Take 17 g by mouth daily.    . potassium chloride SA (K-DUR,KLOR-CON) 20 MEQ tablet Take 1 tablet (20 mEq total) by mouth daily. 30 tablet 0  . rivaroxaban (XARELTO) 20 MG TABS tablet Take 1 tablet (20 mg total) by mouth daily with supper. 30 tablet 0  . sennosides-docusate sodium (SENOKOT-S) 8.6-50 MG tablet Take 2 tablets by mouth 2 (two) times daily.       ROS:                                                                                                                                       ROS was performed and is negative except as noted in HPI   General Examination:  Blood pressure (!) 164/98, pulse 89, temperature 99.2 F (37.3 C), temperature source Oral, resp. rate 18, SpO2 100 %.  HEENT-  Normocephalic, no lesions, without obvious abnormality.  Normal external eye and conjunctiva. Cardiovascular- S1-S2 audible, pulses  palpable throughout  Lungs-no rhonchi or wheezing noted, no excessive working breathing.  Saturations within normal limits Abdomen- All 4 quadrants palpated and non tender Extremities- Warm, dry  Left foot with 3-toe amputation  Musculoskeletal-no joint tenderness, deformity or swelling Skin-warm and dry, left foot with open wound  Neurological Examination Mental Status: Alert, oriented, thought content appropriate.  Speech fluent without evidence of aphasia.  Able to follow  commands without difficulty. Cranial Nerves: II:  Visual fields grossly normal,  III,IV, VI: ptosis not present, extra-ocular motions intact bilaterally, pupils equal, round, reactive to light and accommodation V,VII: smile asymmetric, left facial droop noted,  facial light touch sensation decreased on left face VIII: hearing normal bilaterally IX,X: uvula rises midline XI: bilateral shoulder shrug XII: midline tongue extension Motor: Right : Upper extremity   5/5  Left:     Upper extremity   4/5  Lower extremity   5/5   Lower extremity   4/5 Tone and bulk:normal tone throughout; no atrophy noted Sensory:  light touch intact throughout, bilaterally Deep Tendon Reflexes: hyper-reflexic throughout;  LLE with sustained clonus, RLE with 3 beats of clonus. + hoffman's on Left.  Cerebellar: normal finger-to-nose, normal rapid alternating movements and normal heel-to-shin test Gait: deferred   Lab Results: Basic Metabolic Panel: Recent Labs  Lab 08/30/18 1312 08/30/18 1328  NA 141 143  K 3.6 3.6  CL 108 108  CO2 24  --   GLUCOSE 139* 135*  BUN 9 13  CREATININE 1.24* 1.10*  CALCIUM 9.5  --     CBC: Recent Labs  Lab 08/30/18 1312 08/30/18 1328  WBC 8.5  --   NEUTROABS 3.9  --   HGB 10.2* 11.6*  HCT 35.0* 34.0*  MCV 74.2*  --   PLT 357  --     Imaging: Ct Head Wo Contrast  Result Date: 08/30/2018 CLINICAL DATA:  Left facial droop and numbness. EXAM: CT HEAD WITHOUT CONTRAST TECHNIQUE: Contiguous  axial images were obtained from the base of the skull through the vertex without intravenous contrast. COMPARISON:  Head CT Jan 13, 2018 and brain MRI Jan 14, 2018 FINDINGS: Brain: The ventricles are normal in size and configuration. There is focal hemorrhage arising in the posterior right lentiform nucleus measuring 2.4 x 1.9 x 1.6 cm. No other foci of hemorrhage evident. There is slight cytotoxic edema surrounding this area of hemorrhage. There is no well-defined mass. There is no extra-axial fluid collection or midline shift. There is small vessel disease throughout the centra semiovale bilaterally. There is evidence of a prior small infarct in the left thalamus. There are punctate lacunar infarcts in the anterior basal ganglia regions bilaterally, stable. There is evidence of a prior small lacunar infarct in the mid left cerebellum. Vascular: No hyperdense vessel appreciable. There is calcification in each carotid siphon region. Skull: The bony calvarium appears intact. Sinuses/Orbits: There is opacification and mucosal thickening in multiple ethmoid air cells. There is mucosal thickening in each anterior sphenoid sinus region. Frontal sinuses are hypoplastic. Orbits appear symmetric bilaterally. Other: Mastoid air cells are clear. There is debris in each external auditory canal. IMPRESSION: 1. Apparent hemorrhagic infarct arising in the left posterior lentiform nucleus with slight surrounding edema. No other foci of hemorrhage evident. 2. Small vessel  disease in the centra semiovale bilaterally. Prior small infarcts in the left cerebellum and left thalamus. Tiny lacunar infarcts in each anterior lentiform nucleus. 3.  There are foci of arterial vascular calcification. 4.  Areas of paranasal sinus disease. 5.  Probable cerumen in each external auditory canal. Critical Value/emergent results were called by telephone at the time of interpretation on 08/30/2018 at 1:42 pm to Wyn Quaker, [place] , who verbally  acknowledged these results. Electronically Signed   By: Lowella Grip III M.D.   On: 08/30/2018 13:42     Laurey Morale, MSN, NP-C [facility] [phone]   08/30/2018, 3:13 PM   Attending physician note to follow with Assessment and plan .  Attending addendum I have seen and examined the patient 48 year old woman with a past medical history of a right internal capsule lacunar stroke in 2016 with some residual left-sided weakness, hypertension, diabetes, hyperlipidemia, COPD who underwent left amputation in October and developed a pulmonary embolism afterwards, considered to be a provoked PE and was started on Xarelto and aspirin but she is on Xarelto and Plavix according to her.  She presents to the emergency room for sudden onset of worsening left-sided weakness and numbness. She describes the sensation as numbness and heaviness on the left. Noncontrast CT of the head was done that showed a right basal ganglia hemorrhage. I have independently reviewed the images.  Confirmed the findings of right basal ganglia hemorrhage. On examination, she has mild left hemiparesis and decreased sensation on the left along with mild left lower facial weakness.  At this point, she has not taken her Xarelto or Plavix since 08/27/2018.  I do not think she needs reversal. She did not have a lower extremity Doppler done when the PE was diagnosed.  I have ordered 1.   Assessment: 48 y.o. female With PMH of PE ( 10/19 on xarelto), CVA ( 2016; residual left side weakness), HTN, DM HLD, COPD who presented to [facility] ED with c/o left arm numbness and facial droop. CTH: hemorrhage in right lentiform nucleus. Stroke Risk Factors - diabetes mellitus, hyperlipidemia and hypertension   Plan: Subcortical ICH, nontraumatic Acuity: Acute Laterality: left Current suspected etiology:  Uncontrolled HTN Treatment: stop anti-coagulation and antiplatelets, treat HTN with labetalol and cleviprex -Admit to:  ICU  -ICH Score:0 -ICH Volume: -BP control goal SYS< 140 -neuromonitoring  CNS -Close neuro monitoring - repeat head CT 0500 08/31/18  Hemiparesis left nondominant side -PT OT -PMR consult  RESP -no acute issue -monitor  CV Essential (primary) hypertension Hypertensive emergency -Aggressive BP control, goal SBP < 140 labetalol / cleviprex drip -Titrate oral agents  History of CAD -Hold Plavix for now due to [place]  GI/GU -Gentle hydration  HEME -Monitor -transfuse for hgb < 7  History of being on anticoagulation for PE diagnosed on October 2019. -Had been recommended 3 months of Xarelto along with antiplatelet-unclear aspirin or Plavix. -Noncompliant to medication since 08/27/2018. -Last dose of Xarelto 08/27/2018-does not need reversal of Xarelto because of the ICH. -Hold antiplatelets or anticoagulation for now -We will pursue lower extremity Dopplers to evaluate for any evidence of DVT.  If positive, might require a IVC filter.  ENDO Type 2 diabetes mellitus with hyperglycemia  -SSI -goal HgbA1c < 7  ID Possible Aspiration PNA -CXR -NPO until cleared by bedside swallow or speech  -Monitor -Wound consult for the foot wound  Prophylaxis DVT: SCD's only  GI: doc /senna  Dispo: TBD  Diet: NPO until cleared by speech or  bedside swallow eval  Code Status: Full Code    --please page stroke NP  Or  PA  Or MD from 8am -4 pm  as this patient from this time will be  followed by the stroke.   You can look them up on [email or web address]  Password [email or web address]   Present on admission Left hemiparesis ICH Hypertensive emergency Essential hypertension History of pulmonary embolism On anticoagulation  -- Amie Portland, MD [facility] Pager: [phone] If 7pm to 7am, please call on call as listed on AMION.  CRITICAL CARE ATTESTATION Performed by: Amie Portland, MD Total critical care time: 55 minutes Critical care time was exclusive of separately billable  procedures and treating other patients and/or supervising APPs/Residents/Students Critical care was necessary to treat or prevent imminent or life-threatening deterioration due to intracerebral hemorrhage, hypertensive emergency This patient is critically ill and at significant risk for neurological worsening and/or death and care requires constant monitoring. Critical care was time spent personally by me on the following activities: development of treatment plan with patient and/or surrogate as well as nursing, discussions with consultants, evaluation of patient's response to treatment, examination of patient, obtaining history from patient or surrogate, ordering and performing treatments and interventions, ordering and review of laboratory studies, ordering and review of radiographic studies, pulse oximetry, re-evaluation of patient's condition, participation in multidisciplinary rounds and medical decision making of high complexity in the care of this patient.

## 2018-08-30 NOTE — ED Triage Notes (Signed)
Pt in c/o facial droop and numbness for the last 4 days, symptoms to the left side, pt has previous deficits to her left arm from a previous stroke, unsure if any changes in weakness

## 2018-08-30 NOTE — ED Provider Notes (Signed)
[facility] EMERGENCY DEPARTMENT Provider Note   CSN: [id number] Arrival date & time: 08/30/18  1252     History   Chief Complaint Chief Complaint  Patient presents with  . Facial Droop    HPI Diamond Rice is a 48 y.o. female with a past medical history of hypertension, prior CVA resulting in left-sided deficits, diabetes, COPD who presents to ED for facial droop, numbness to the left side of her face as well as left-sided weakness for the past 4 days.  Patient was in [place] for rehab after her toe amputation in October.  She was discharged 4 days ago.  She has not been able to fill her anticoagulants, antiplatelets or antihypertensives since being discharged.  She appears to be a poor historian so unsure if her symptoms worsen this morning although she does note that she has had numbness for 4 days.  She denies any head injuries or falls.  She denies any vision changes, headache, vomiting.  HPI  Past Medical History:  Diagnosis Date  . Anxiety   . Chronic lower back pain 04/17/2012   "just got over some; catched when I walked"  . COPD (chronic obstructive pulmonary disease) ([place])   . Critical lower limb ischemia 05/27/2018  . Depression   . Headache(784.0) 04/17/2012   "~ qod; lately waking up in am w/one"  . High cholesterol   . Hypertension   . Migraines 04/17/2012  . Obesity   . Sleep apnea    CPAP  . Stroke ([place])   . Type II diabetes mellitus ([place]) 08/28/2002    Patient Active Problem List   Diagnosis Date Noted  . Hypertensive heart disease without heart failure 07/04/2018  . Nonrheumatic aortic valve insufficiency 07/04/2018  . Esophageal abnormality   . Class 2 severe obesity due to excess calories with serious comorbidity and body mass index (BMI) of 35.0 to 35.9 in adult [facility])   . Peripheral vascular disease ([place])   . History of osteomyelitis   . History of amputation of lesser toe ([place])   . Pulmonary embolism ([place]) 06/22/2018  .  Microcytic anemia 06/17/2018  . At risk for adverse drug reaction 06/11/2018  . Critical lower limb ischemia 05/27/2018  . GI bleeding 04/02/2018  . Acute renal failure (ARF) ([place]) 04/02/2018  . Hypokalemia 04/02/2018  . Polysubstance abuse ([place]) 04/02/2018  . Diabetic foot ulcer ([place]) 04/02/2018  . Cannabis use disorder, moderate, dependence ([place]) 05/19/2017  . Cocaine use disorder, moderate, dependence ([place]) 05/19/2017  . Schizoaffective disorder, bipolar type ([place]) 10/07/2015  . OSA (obstructive sleep apnea) 06/09/2015  . Vitamin D deficiency 01/06/2015  . Bilateral knee pain 01/05/2015  . Numbness and tingling in right hand 01/05/2015  . Insomnia 12/07/2014  . Right-sided lacunar infarction ([place]) 09/15/2014  . Left hemiparesis ([place]) 09/15/2014  . Dyspnea   . Back pain 09/11/2013  . Psychoactive substance-induced organic mood disorder ([place]) 05/28/2013  . Cocaine abuse with cocaine-induced mood disorder ([place]) 05/28/2013  . Cannabis dependence with cannabis-induced anxiety disorder ([place]) 05/28/2013  . Morbid obesity with BMI of 45.0-49.9, adult ([place]) 04/25/2012  . Chest pain 04/17/2012  . Hypertension 04/17/2012  . DM (diabetes mellitus) with peripheral vascular complication ([place]) 40/98/1191  . Hyperlipidemia 04/17/2012  . Tobacco use 04/17/2012    Past Surgical History:  Procedure Laterality Date  . ABDOMINAL AORTOGRAM W/LOWER EXTREMITY Left 05/27/2018   Procedure: ABDOMINAL AORTOGRAM W/LOWER EXTREMITY Run[place] and Possible Intervention;  Surgeon: Waynetta Sandy, MD;  Location: [facility]  CV LAB;  Service: Cardiovascular;  Laterality: Left;  . APPLICATION OF WOUND VAC Left 06/06/2018   Procedure: APPLICATION OF WOUND VAC;  Surgeon: Waynetta Sandy, MD;  Location: [facility];  Service: Vascular;  Laterality: Left;  . BIOPSY  04/04/2018   Procedure: BIOPSY;  Surgeon: [name], MD;  Location: [facility] ENDOSCOPY;  Service: Endoscopy;;  . CARDIAC CATHETERIZATION  ~ 2011   . COLONOSCOPY N/A 04/04/2018   Procedure: COLONOSCOPY;  Surgeon: [name], MD;  Location: [facility] ENDOSCOPY;  Service: Endoscopy;  Laterality: N/A;  . LOWER EXTREMITY ANGIOGRAM Left 05/27/2018  . PERIPHERAL VASCULAR INTERVENTION Left 05/27/2018   Procedure: PERIPHERAL VASCULAR INTERVENTION;  Surgeon: Waynetta Sandy, MD;  Location: [facility] LAB;  Service: Cardiovascular;  Laterality: Left;  . POLYPECTOMY  04/04/2018   Procedure: POLYPECTOMY;  Surgeon: [name], MD;  Location: [facility] ENDOSCOPY;  Service: Endoscopy;;  . TRANSMETATARSAL AMPUTATION Left 05/28/2018   Procedure: AMPUTATION TOES THREE, FOUR AND FIVE on left foot;  Surgeon: Waynetta Sandy, MD;  Location: [facility];  Service: Vascular;  Laterality: Left;  . WOUND DEBRIDEMENT Left 06/06/2018   Procedure: DEBRIDEMENT WOUND LEFT FOOT;  Surgeon: Waynetta Sandy, MD;  Location: [facility];  Service: Vascular;  Laterality: Left;     OB History   No obstetric history on file.      Home Medications    Prior to Admission medications   Medication Sig Start Date End Date Taking? Authorizing Provider  ARIPiprazole (ABILIFY) 15 MG tablet Take 1 tablet (15 mg total) by mouth at bedtime. 08/26/18   Medina-Vargas, Monina C, NP  cloNIDine (CATAPRES) 0.3 MG tablet Take 1 tablet (0.3 mg total) by mouth 3 (three) times daily. 08/26/18   Medina-Vargas, Monina C, NP  clopidogrel (PLAVIX) 75 MG tablet Take 1 tablet (75 mg total) by mouth daily. 08/26/18   Medina-Vargas, Monina C, NP  ferrous sulfate 325 (65 FE) MG tablet Take 1 tablet (325 mg total) by mouth daily with breakfast. 08/26/18   Medina-Vargas, Monina C, NP  furosemide (LASIX) 40 MG tablet Take 1 tablet (40 mg total) by mouth daily. 08/26/18   Medina-Vargas, Monina C, NP  gabapentin (NEURONTIN) 100 MG capsule Take 1 capsule (100 mg total) by mouth 2 (two) times daily. 100 mg at 1 pm and 100 mg qhs 08/26/18   Medina-Vargas, Monina C, NP  hydrALAZINE (APRESOLINE) 50 MG  tablet Take 1 tablet (50 mg total) by mouth 4 (four) times daily. 08/26/18   Medina-Vargas, Monina C, NP  Insulin Detemir (LEVEMIR) 100 UNIT/ML Pen Inject 15 Units into the skin 2 (two) times daily. 08/26/18   Medina-Vargas, Monina C, NP  lactulose (CHRONULAC) 10 GM/15ML solution Take 30 mLs (20 g total) by mouth daily. 08/26/18   Medina-Vargas, Monina C, NP  lisinopril (PRINIVIL,ZESTRIL) 30 MG tablet Take 1 tablet (30 mg total) by mouth daily. 08/26/18   Medina-Vargas, Monina C, NP  Melatonin 3 MG TABS Take 3 mg by mouth at bedtime.     [provider]  Menthol, Topical Analgesic, (BIOFREEZE) 4 % GEL Apply 1 application topically every 6 (six) hours as needed (Moderate pain). 08/26/18   Medina-Vargas, Monina C, NP  metoprolol tartrate (LOPRESSOR) 25 MG tablet Take 1 tablet (25 mg total) by mouth 2 (two) times daily. 08/26/18   Medina-Vargas, Monina C, NP  oxycodone (OXY-IR) 5 MG capsule Take 1 capsule (5 mg total) by mouth every 8 (eight) hours as needed for pain. 08/26/18   Medina-Vargas, Senaida Lange, NP  pentoxifylline (TRENTAL) 400 MG CR tablet Take 1 tablet (400 mg total) by mouth 3 (three) times daily with meals. 08/26/18   Medina-Vargas, Monina C, NP  polyethylene glycol (MIRALAX / GLYCOLAX) packet Take 17 g by mouth daily.    [provider]  potassium chloride SA (K-DUR,KLOR-CON) 20 MEQ tablet Take 1 tablet (20 mEq total) by mouth daily. 08/26/18   Medina-Vargas, Monina C, NP  rivaroxaban (XARELTO) 20 MG TABS tablet Take 1 tablet (20 mg total) by mouth daily with supper. 08/26/18   Medina-Vargas, Monina C, NP  sennosides-docusate sodium (SENOKOT-S) 8.6-50 MG tablet Take 2 tablets by mouth 2 (two) times daily.    [provider]    Family History Family History  Problem Relation Age of Onset  . Stroke Brother 29  . Stroke Brother 45  . Heart attack Mother 81  . Stroke Father 55    Social History Social History   Tobacco Use  . Smoking status: Former Smoker      Packs/day: 0.25    Years: 28.00    Pack years: 7.00    Types: Cigarettes  . Smokeless tobacco: Never Used  Substance Use Topics  . Alcohol use: No    Alcohol/week: 0.0 standard drinks    Comment: rare  . Drug use: Yes    Types: Marijuana, "Crack" cocaine    Comment: marijuana last use days ago; last cocaine several months ago     Allergies   Morphine and related and Metformin   Review of Systems Review of Systems  Constitutional: Negative for appetite change, chills and fever.  HENT: Negative for ear pain, rhinorrhea, sneezing and sore throat.   Eyes: Negative for photophobia and visual disturbance.  Respiratory: Negative for cough, chest tightness, shortness of breath and wheezing.   Cardiovascular: Negative for chest pain and palpitations.  Gastrointestinal: Negative for abdominal pain, blood in stool, constipation, diarrhea, nausea and vomiting.  Genitourinary: Negative for dysuria, hematuria and urgency.  Musculoskeletal: Negative for myalgias.  Skin: Negative for rash.  Neurological: Positive for facial asymmetry, weakness and numbness. Negative for dizziness and light-headedness.     Physical Exam Updated Vital Signs BP (!) 164/98 (BP Location: Right Arm)   Pulse 89   Temp 99.2 F (37.3 C) (Oral)   Resp 18   SpO2 100%   Physical Exam Vitals signs and nursing note reviewed.  Constitutional:      General: She is not in acute distress.    Appearance: She is well-developed.  HENT:     Head: Normocephalic and atraumatic.     Nose: Nose normal.  Eyes:     General: No scleral icterus.       Right eye: No discharge.        Left eye: No discharge.     Conjunctiva/sclera: Conjunctivae normal.     Pupils: Pupils are equal, round, and reactive to light.  Neck:     Musculoskeletal: Normal range of motion and neck supple.  Cardiovascular:     Rate and Rhythm: Normal rate and regular rhythm.     Heart sounds: Normal heart sounds. No murmur. No friction rub. No  gallop.   Pulmonary:     Effort: Pulmonary effort is normal. No respiratory distress.     Breath sounds: Normal breath sounds.  Abdominal:     General: Bowel sounds are normal. There is no distension.     Palpations: Abdomen is soft.     Tenderness: There is no abdominal tenderness. There is no  guarding.  Musculoskeletal: Normal range of motion.  Skin:    General: Skin is warm and dry.     Findings: No rash.  Neurological:     Mental Status: She is alert and oriented to person, place, and time.     Cranial Nerves: Cranial nerve deficit present.     Sensory: Sensory deficit present.     Motor: Weakness present. No abnormal muscle tone.     Coordination: Coordination normal.     Comments: Left-sided facial droop.  Patient reports decreased sensation to the left side of her face with light touch.  Weakness noted in bilateral upper extremities.      ED Treatments / Results  Labs (all labs ordered are listed, but only abnormal results are displayed) Labs Reviewed  CBC - Abnormal; Notable for the following components:      Result Value   Hemoglobin 10.2 (*)    HCT 35.0 (*)    MCV 74.2 (*)    MCH 21.6 (*)    MCHC 29.1 (*)    All other components within normal limits  COMPREHENSIVE METABOLIC PANEL - Abnormal; Notable for the following components:   Glucose, Bld 139 (*)    Creatinine, Ser 1.24 (*)    Albumin 3.3 (*)    GFR calc non Af Amer 52 (*)    All other components within normal limits  I-STAT CHEM 8, ED - Abnormal; Notable for the following components:   Creatinine, Ser 1.10 (*)    Glucose, Bld 135 (*)    Hemoglobin 11.6 (*)    HCT 34.0 (*)    All other components within normal limits  PROTIME-INR  APTT  DIFFERENTIAL  I-STAT TROPONIN, ED  CBG MONITORING, ED    EKG None  Radiology Ct Head Wo Contrast  Result Date: 08/30/2018 CLINICAL DATA:  Left facial droop and numbness. EXAM: CT HEAD WITHOUT CONTRAST TECHNIQUE: Contiguous axial images were obtained from the  base of the skull through the vertex without intravenous contrast. COMPARISON:  Head CT Jan 13, 2018 and brain MRI Jan 14, 2018 FINDINGS: Brain: The ventricles are normal in size and configuration. There is focal hemorrhage arising in the posterior right lentiform nucleus measuring 2.4 x 1.9 x 1.6 cm. No other foci of hemorrhage evident. There is slight cytotoxic edema surrounding this area of hemorrhage. There is no well-defined mass. There is no extra-axial fluid collection or midline shift. There is small vessel disease throughout the centra semiovale bilaterally. There is evidence of a prior small infarct in the left thalamus. There are punctate lacunar infarcts in the anterior basal ganglia regions bilaterally, stable. There is evidence of a prior small lacunar infarct in the mid left cerebellum. Vascular: No hyperdense vessel appreciable. There is calcification in each carotid siphon region. Skull: The bony calvarium appears intact. Sinuses/Orbits: There is opacification and mucosal thickening in multiple ethmoid air cells. There is mucosal thickening in each anterior sphenoid sinus region. Frontal sinuses are hypoplastic. Orbits appear symmetric bilaterally. Other: Mastoid air cells are clear. There is debris in each external auditory canal. IMPRESSION: 1. Apparent hemorrhagic infarct arising in the left posterior lentiform nucleus with slight surrounding edema. No other foci of hemorrhage evident. 2. Small vessel disease in the centra semiovale bilaterally. Prior small infarcts in the left cerebellum and left thalamus. Tiny lacunar infarcts in each anterior lentiform nucleus. 3.  There are foci of arterial vascular calcification. 4.  Areas of paranasal sinus disease. 5.  Probable cerumen in each external auditory canal.  Critical Value/emergent results were called by telephone at the time of interpretation on 08/30/2018 at 1:42 pm to Wyn Quaker, [place] , who verbally acknowledged these results.  Electronically Signed   By: Lowella Grip III M.D.   On: 08/30/2018 13:42    Procedures Procedures (including critical care time)  CRITICAL CARE Performed by: Delia Heady   Total critical care time: 45 minutes  Critical care time was exclusive of separately billable procedures and treating other patients.  Critical care was necessary to treat or prevent imminent or life-threatening deterioration.  Critical care was time spent personally by me on the following activities: development of treatment plan with patient and/or surrogate as well as nursing, discussions with consultants, evaluation of patient's response to treatment, examination of patient, obtaining history from patient or surrogate, ordering and performing treatments and interventions, ordering and review of laboratory studies, ordering and review of radiographic studies, pulse oximetry and re-evaluation of patient's condition.   Medications Ordered in ED Medications  clevidipine (CLEVIPREX) infusion 0.5 mg/mL (has no administration in time range)     Initial Impression / Assessment and Plan / ED Course  I have reviewed the triage vital signs and the nursing notes.  Pertinent labs & imaging results that were available during my care of the patient were reviewed by me and considered in my medical decision making (see chart for details).  Clinical Course as of Aug 31 1451  Fri Aug 30, 2018  1341 Right hemorrhagic basal ganglia infarct per radiology   [EH]  25 Spoke with charge RN and made her aware of patient's results and need to be roomed.    [EH]    Clinical Course User Index [EH] Lorin Glass, PA-C    48 year old female with a past medical history of prior CVA, diabetes, hypertension presents to ED for facial droop, numbness for the past 4 days.  She is status post amputation of third through fifth digits of her left foot and was discharged from rehab 3 days ago.  She has been unable to fill her  anticoagulants and antihypertensive since discharge.  Her symptoms began 4 days ago but questionably worsened this morning.  Patient is a poor historian.  CT shows hemorrhagic stroke on the left.  Patient hypertensive to 164/98.  I spoke to Dr. Rory Percy of neurology, he asked that we begin Cleviprex, will admit to neurology service.    Portions of this note were generated with Lobbyist. Dictation errors may occur despite best attempts at proofreading.   Final Clinical Impressions(s) / ED Diagnoses   Final diagnoses:  Hemorrhagic stroke [facility])    ED Discharge Orders    None       Delia Heady, PA-C 08/30/18 1453    Gareth Morgan, MD 08/31/18 1047

## 2018-08-30 NOTE — ED Triage Notes (Signed)
Pt has been off her blood thinner and BP medication since leaving [facility] 4 days ago

## 2018-08-31 LAB — GLUCOSE, CAPILLARY
Glucose-Capillary: 187 mg/dL — ABNORMAL HIGH (ref 70–99)
Glucose-Capillary: 127 mg/dL — ABNORMAL HIGH (ref 70–99)
Glucose-Capillary: 156 mg/dL — ABNORMAL HIGH (ref 70–99)
Glucose-Capillary: 144 mg/dL — ABNORMAL HIGH (ref 70–99)

## 2018-08-31 LAB — MRSA PCR SCREENING: MRSA by PCR: POSITIVE — AB

## 2018-08-31 MED ORDER — MELATONIN 3 MG PO TABS
3.0000 mg | ORAL_TABLET | Freq: Every day | ORAL | Status: DC
  Administered 2018-08-31 – 2018-09-20 (×21): 3 mg via ORAL
  Filled 2018-08-31 (×21): qty 1

## 2018-08-31 MED ORDER — CHLORHEXIDINE GLUCONATE CLOTH 2 % EX PADS
6.0000 | MEDICATED_PAD | Freq: Every morning | CUTANEOUS | Status: AC
  Administered 2018-08-31 – 2018-09-04 (×4): 6 via TOPICAL

## 2018-08-31 MED ORDER — HYDRALAZINE HCL 20 MG/ML IJ SOLN
20.0000 mg | Freq: Three times a day (TID) | INTRAMUSCULAR | Status: DC | PRN
  Administered 2018-08-31: 20 mg via INTRAVENOUS
  Filled 2018-08-31 (×4): qty 1

## 2018-08-31 MED ORDER — SENNA-DOCUSATE SODIUM 8.6-50 MG PO TABS: 2.0000 | ORAL_TABLET | Freq: Two times a day (BID) | ORAL | Status: DC

## 2018-08-31 MED ORDER — MUSCLE RUB 10-15 % EX CREA
TOPICAL_CREAM | Freq: Four times a day (QID) | CUTANEOUS | Status: DC | PRN
  Administered 2018-09-01: 22:00:00 via TOPICAL
  Filled 2018-08-31 (×2): qty 85

## 2018-08-31 MED ORDER — GABAPENTIN 100 MG PO CAPS
100.0000 mg | ORAL_CAPSULE | Freq: Two times a day (BID) | ORAL | Status: DC
  Administered 2018-08-31 – 2018-09-08 (×16): 100 mg via ORAL
  Filled 2018-08-31 (×17): qty 1

## 2018-08-31 MED ORDER — PENTOXIFYLLINE ER 400 MG PO TBCR
400.0000 mg | EXTENDED_RELEASE_TABLET | Freq: Three times a day (TID) | ORAL | Status: DC
  Administered 2018-08-31 – 2018-09-21 (×57): 400 mg via ORAL
  Filled 2018-08-31 (×66): qty 1

## 2018-08-31 MED ORDER — HYDRALAZINE HCL 50 MG PO TABS
50.0000 mg | ORAL_TABLET | Freq: Four times a day (QID) | ORAL | Status: DC
  Administered 2018-08-31 – 2018-09-21 (×79): 50 mg via ORAL
  Filled 2018-08-31 (×78): qty 1

## 2018-08-31 MED ORDER — ARIPIPRAZOLE 10 MG PO TABS
15.0000 mg | ORAL_TABLET | Freq: Every day | ORAL | Status: DC
  Administered 2018-08-31 – 2018-09-20 (×21): 15 mg via ORAL
  Filled 2018-08-31: qty 1
  Filled 2018-08-31 (×3): qty 2
  Filled 2018-08-31: qty 1
  Filled 2018-08-31 (×16): qty 2

## 2018-08-31 MED ORDER — MUPIROCIN 2 % EX OINT
1.0000 "application " | TOPICAL_OINTMENT | Freq: Two times a day (BID) | CUTANEOUS | Status: AC
  Administered 2018-08-31 – 2018-09-04 (×10): 1 via NASAL
  Filled 2018-08-31: qty 22

## 2018-08-31 MED ORDER — SENNOSIDES-DOCUSATE SODIUM 8.6-50 MG PO TABS
2.0000 | ORAL_TABLET | Freq: Two times a day (BID) | ORAL | Status: DC
  Administered 2018-08-31 – 2018-09-07 (×13): 2 via ORAL
  Filled 2018-08-31 (×14): qty 2

## 2018-08-31 MED ORDER — POLYETHYLENE GLYCOL 3350 17 G PO PACK
17.0000 g | PACK | Freq: Every day | ORAL | Status: DC
  Administered 2018-08-31 – 2018-09-07 (×5): 17 g via ORAL
  Filled 2018-08-31 (×7): qty 1

## 2018-08-31 MED ORDER — LABETALOL HCL 5 MG/ML IV SOLN
20.0000 mg | INTRAVENOUS | Status: DC | PRN
  Administered 2018-08-31: 20 mg via INTRAVENOUS
  Filled 2018-08-31 (×3): qty 4

## 2018-08-31 MED ORDER — GLUCERNA SHAKE PO LIQD
237.0000 mL | Freq: Two times a day (BID) | ORAL | Status: DC
  Administered 2018-09-02 – 2018-09-11 (×20): 237 mL via ORAL

## 2018-08-31 MED ORDER — MENTHOL (TOPICAL ANALGESIC) 4 % EX GEL: 1.0000 "application " | Freq: Four times a day (QID) | CUTANEOUS | Status: DC | PRN

## 2018-08-31 MED ORDER — FERROUS SULFATE 325 (65 FE) MG PO TABS
325.0000 mg | ORAL_TABLET | Freq: Every day | ORAL | Status: DC
  Administered 2018-09-02 – 2018-09-21 (×20): 325 mg via ORAL
  Filled 2018-08-31 (×23): qty 1

## 2018-08-31 MED ORDER — CLONIDINE HCL 0.1 MG PO TABS
0.3000 mg | ORAL_TABLET | Freq: Three times a day (TID) | ORAL | Status: DC
  Administered 2018-08-31 – 2018-09-21 (×63): 0.3 mg via ORAL
  Filled 2018-08-31 (×64): qty 3

## 2018-08-31 MED ORDER — METOPROLOL TARTRATE 25 MG PO TABS
25.0000 mg | ORAL_TABLET | Freq: Two times a day (BID) | ORAL | Status: DC
  Administered 2018-08-31 – 2018-09-21 (×41): 25 mg via ORAL
  Filled 2018-08-31 (×19): qty 1
  Filled 2018-08-31: qty 2
  Filled 2018-08-31 (×2): qty 1
  Filled 2018-08-31: qty 2
  Filled 2018-08-31 (×19): qty 1

## 2018-08-31 MED ORDER — POTASSIUM CHLORIDE CRYS ER 20 MEQ PO TBCR
20.0000 meq | EXTENDED_RELEASE_TABLET | Freq: Every day | ORAL | Status: DC
  Administered 2018-08-31 – 2018-09-21 (×22): 20 meq via ORAL
  Filled 2018-08-31 (×22): qty 1

## 2018-08-31 MED ORDER — LACTULOSE 10 GM/15ML PO SOLN
20.0000 g | Freq: Every day | ORAL | Status: DC
  Administered 2018-09-01 – 2018-09-07 (×5): 20 g via ORAL
  Filled 2018-08-31 (×6): qty 30

## 2018-08-31 MED ORDER — FUROSEMIDE 40 MG PO TABS
40.0000 mg | ORAL_TABLET | Freq: Every day | ORAL | Status: DC
  Administered 2018-08-31 – 2018-09-21 (×22): 40 mg via ORAL
  Filled 2018-08-31 (×22): qty 1

## 2018-08-31 MED ORDER — LISINOPRIL 20 MG PO TABS
30.0000 mg | ORAL_TABLET | Freq: Every day | ORAL | Status: DC
  Administered 2018-08-31 – 2018-09-21 (×22): 30 mg via ORAL
  Filled 2018-08-31 (×23): qty 1

## 2018-08-31 MED ORDER — INSULIN DETEMIR 100 UNIT/ML ~~LOC~~ SOLN
15.0000 [IU] | Freq: Two times a day (BID) | SUBCUTANEOUS | Status: DC
  Administered 2018-08-31 – 2018-09-21 (×42): 15 [IU] via SUBCUTANEOUS
  Filled 2018-08-31 (×43): qty 0.15

## 2018-08-31 NOTE — Progress Notes (Signed)
Initial Nutrition Assessment   DOCUMENTATION CODES:   Obesity unspecified  INTERVENTION:  Provide Glucerna Shake po BID, each supplement provides 220 kcal and 10 grams of protein.  Encourage adequate PO intake.  NUTRITION DIAGNOSIS:   Inadequate oral intake related to poor appetite as evidenced by meal completion < 50%.  GOAL:   Patient will meet greater than or equal to 90% of their needs  MONITOR:   PO intake, Supplement acceptance, Weight trends, I & O's, Skin, Labs  REASON FOR ASSESSMENT:   Malnutrition Screening Tool    ASSESSMENT:   48 y.o. female  With PMH of PE ( 10/19 on xarelto), CVA ( 2016; residual left side weakness), HTN, DM HLD, COPD who presented to [facility] ED with c/o left arm numbness and facial droop.  Meal completion has been 25%. Pt reports having a decreased appetite which has been ongoing over the past ~2 weeks. Pt reports she has only been consuming 1 meal a day. No weight loss per weight records. Pt is agreeable to nutritional supplements to aid in caloric and protein needs. RD to order.   Labs and medications reviewed.   NUTRITION - FOCUSED PHYSICAL EXAM:    Most Recent Value  Orbital Region  No depletion  Upper Arm Region  No depletion  Thoracic and Lumbar Region  No depletion  Buccal Region  No depletion  Temple Region  No depletion  Clavicle Bone Region  No depletion  Clavicle and Acromion Bone Region  No depletion  Scapular Bone Region  No depletion  Dorsal Hand  No depletion  Patellar Region  No depletion  Anterior Thigh Region  No depletion  Posterior Calf Region  No depletion  Edema (RD Assessment)  None  Hair  Reviewed  Eyes  Reviewed  Mouth  Reviewed  Skin  Reviewed  Nails  Reviewed       Diet Order:   Diet Order            Diet heart healthy/carb modified Room service appropriate? Yes; Fluid consistency: Thin  Diet effective now              EDUCATION NEEDS:   Not appropriate for education at this time  Skin:   Skin Assessment: Reviewed RN Assessment  Last BM:  1/2  Height:   Ht Readings from Last 1 Encounters:  08/30/18 5\' 1"  (1.549 m)    Weight:   Wt Readings from Last 1 Encounters:  08/30/18 88.5 kg    Ideal Body Weight:  47.7 kg  BMI:  Body mass index is 36.87 kg/m.  Estimated Nutritional Needs:   Kcal:  1700-1900  Protein:  85-100 grams  Fluid:  1.7 - 1.9 L/day    Corrin Parker, MS, RD, LDN Pager # [phone] After hours/ weekend pager # [phone]

## 2018-08-31 NOTE — Progress Notes (Signed)
STROKE TEAM PROGRESS NOTE   HISTORY OF PRESENT ILLNESS (per record) Diamond Rice is an 48 y.o. female  With [place] of PE ( 10/19 on xarelto), CVA ( 2016; residual left side weakness), HTN, DM HLD, COPD who presented to [facility] ED with c/o left arm numbness and facial droop.  The patient went to bed in her usual state of health. When she woke up this morning she states that she was normal. About 0900 she had an onset of left arm numbness. Patient has not been on xarelto or plavix for 3 days ( 08/27/18), she did not have the money to pick up her prescriptions. Denies any diplopia, CP, SOB, HA,fever, abdominal pain, n/v or palpitations.   ED course:  BP: 170/98 BG: 135 Creatinine: 1.10, Hgb: 11.0 HCT: 34.0 CTH: hemorrhagic infarct in right lentiform nucleus   7/32/20: right PLIC infarct secondary to small vessel disease; left side residual deficits.  Date last known well: Date: 08/30/2018 Time last known well: Time: 09:00 tPA Given: No: ICH Modified Rankin: Rankin Score=1 NIHSS:3 drift, sensation, facial droop Intracerebral Hemorrhage (ICH) Score Total:  0   SUBJECTIVE (INTERVAL HISTORY) Her RN s at the bedside.  Her BP is controlled on cardene drip. She passed swallow eval. She admits to not taking her BP meds for several days   OBJECTIVE Vitals:   08/31/18 0615 08/31/18 0630 08/31/18 0645 08/31/18 0700  BP: (!) 131/99 139/90 134/89 (!) 129/99  Pulse: (!) 103 98 93   Resp: (!) 23 15 (!) 23 (!) 23  Temp:      TempSrc:      SpO2: 94% 100% 98%   Weight:      Height:        CBC:  Recent Labs  Lab 08/30/18 1312 08/30/18 1328  WBC 8.5  --   NEUTROABS 3.9  --   HGB 10.2* 11.6*  HCT 35.0* 34.0*  MCV 74.2*  --   PLT 357  --     Basic Metabolic Panel:  Recent Labs  Lab 08/30/18 1312 08/30/18 1328  NA 141 143  K 3.6 3.6  CL 108 108  CO2 24  --   GLUCOSE 139* 135*  BUN 9 13  CREATININE 1.24* 1.10*  CALCIUM 9.5  --     Lipid Panel:     Component Value  Date/Time   CHOL 119 04/03/2018 0508   TRIG 90 04/03/2018 0508   HDL 29 (L) 04/03/2018 0508   CHOLHDL 4.1 04/03/2018 0508   VLDL 18 04/03/2018 0508   LDLCALC 72 04/03/2018 0508   HgbA1c:  Lab Results  Component Value Date   HGBA1C 6.8 (H) 06/22/2018   Urine Drug Screen:     Component Value Date/Time   LABOPIA NONE DETECTED 06/22/2018 1708   COCAINSCRNUR NONE DETECTED 06/22/2018 1708   LABBENZ NONE DETECTED 06/22/2018 1708   AMPHETMU NONE DETECTED 06/22/2018 1708   THCU POSITIVE (A) 06/22/2018 1708   LABBARB NONE DETECTED 06/22/2018 1708    Alcohol Level     Component Value Date/Time   ETH <10 01/13/2018 1839    IMAGING   Ct Head Wo Contrast 08/30/2018 IMPRESSION:  1. No change in the acute hematoma or hemorrhagic infarction in the posterior right basal ganglia/lateral thalamus/posterior limb internal capsule measures 2.3 x 1.7 x 1.7 cma (volume = 3.5 cm).  2. Chronic small-vessel ischemic changes of the hemispheric white matter.    Ct Head Wo Contrast 08/30/2018 IMPRESSION:  1. Apparent hemorrhagic infarct arising in the  left posterior lentiform nucleus with slight surrounding edema. No other foci of hemorrhage evident.  2. Small vessel disease in the centra semiovale bilaterally. Prior small infarcts in the left cerebellum and left thalamus. Tiny lacunar infarcts in each anterior lentiform nucleus.  3.  There are foci of arterial vascular calcification.  4.  Areas of paranasal sinus disease.  5.  Probable cerumen in each external auditory canal.    Vas [place] Lower Extremity Venous (dvt) 08/30/2018 Summary:  Right: There is no evidence of deep vein thrombosis in the lower extremity. No cystic structure found in the popliteal fossa.  Left: There is no evidence of deep vein thrombosis in the lower extremity. However, portions of this examination were limited- see technologist comments above. No cystic structure found in the popliteal fossa.     Transthoracic  Echocardiogram  06/24/2018 Study Conclusions - Left ventricle: The cavity size was moderately reduced. Wall   thickness was increased in a pattern of severe LVH. Systolic   function was vigorous. The estimated ejection fraction was in the   range of 65% to 70%. Doppler parameters are consistent with   abnormal left ventricular relaxation (grade 1 diastolic   dysfunction). - Aortic valve: There was mild to moderate regurgitation. - Left atrium: The atrium was mildly dilated. - Pericardium, extracardiac: A trivial pericardial effusion was   identified. Impressions: - Compared to report from 2016, LV is thicker. Consider   infiltrative process vs hypertrophic cardiomyopathy.    PHYSICAL EXAM Blood pressure (!) 129/99, pulse 93, temperature 98.9 F (37.2 C), temperature source Oral, resp. rate (!) 23, height 5\' 1"  (1.549 m), weight 88.5 kg, SpO2 98 %.  Pleasant middle aged african american lady not in distress. . Afebrile. Head is nontraumatic. Neck is supple without bruit.    Cardiac exam no murmur or gallop. Lungs are clear to auscultation. Distal pulses are well felt. Neurological Exam ;  Awake alert oriented x 3 normal speech and language.Mild dysarthria. Mild left lower face asymmetry. Tongue midline. No drift. Mild diminished fine finger movements on left. Orbits right over left upper extremity. Mild left grip weak.. Normal sensation . Normal coordination.  NIHSS 3     ASSESSMENT/PLAN Diamond Rice is a 48 y.o. female with history of PE ( 10/19 on xarelto), CVA ( 2016; residual left side weakness), HTN, ASPVD, DM HLD, COPD  presenting with left arm numbness and facial droop. She did not receive IV t-PA due to [place].  Stroke: hemorrhagic infarct arising in the left posterior lentiform nucleus  Resultant  Mild left hemiplegia  CT head - hemorrhagic infarct arising in the left posterior lentiform nucleus - multiple old infarcts  MRI head - not performed  MRA head - not  performed  CTA H&N  - not ordered  Bilateral lower extremity venous dopplers - negative for DVT  2D Echo - 06/24/18  - EF 65 - 70%. No cardiac source of emboli identified.   LDL  - not ordered  HgbA1c - will order  UDS - will order  VTE prophylaxis - SCDs  Diet - Heart healthy / carb modified with thin liquids.  clopidogrel 75 mg daily, Xarelto (rivaroxaban) daily but recently ran out of medications prior to admission, now on No antithrombotic  Ongoing aggressive stroke risk factor management  Therapy recommendations:  pending  Disposition:  Pending  Hypertension  Stable . Long-term BP goal normotensive   Diabetes  HgbA1c pending, goal < 7.0  Unc / Controlled  Other Stroke  Risk Factors  Former cigarette smoker - quit  Substance abuse - crack cocaine  Obesity, Body mass index is 36.87 kg/m., recommend weight loss, diet and exercise as appropriate   Hx stroke/TIA  Family hx stroke (father - brothers)  Migraines  Obstructive sleep apnea, on CPAP at home   Other Active Problems  Mild anemia  Creatinine - 1.10  Substance abuse history   Recommend strict control of blood pressure and wean Cardene drip and use IV as needed labetalol or hydralazine.  Resume home blood pressure medications.  We will have to hold Xarelto given her spontaneous intracerebral hemorrhage and lower extremity venous Doppler showed resolution of DVT.  May start aspirin after a few days.  Patient counseled to quit marijuana and smoking.  Mobilize out of bed.  Therapy consults.Check MRI/MRA brain This patient is critically ill and at significant risk of neurological worsening, death and care requires constant monitoring of vital signs, hemodynamics,respiratory and cardiac monitoring, extensive review of multiple databases, frequent neurological assessment, discussion with family, other specialists and medical decision making of high complexity.I have made any additions or clarifications  directly to the above note.This critical care time does not reflect procedure time, or teaching time or supervisory time of PA/NP/Med Resident etc but could involve care discussion time.  I spent 35 minutes of neurocritical care time  in the care of  this patient.     Hospital day # 1    To contact Stroke Continuity provider, please refer to [email or web address] After hours, contact General Neurology

## 2018-08-31 NOTE — Evaluation (Signed)
Physical Therapy Evaluation Patient Details Name: Diamond Rice MRN: [id number] DOB: [birth year] Today's Date: 08/31/2018   History of Present Illness  Pt adm with lt sided weakness and CT showed hemorrhagic infarct arising in the left posterior lentiform nucleus. PMH:  rt CVA, lt transmet amputation 05/28/18, Sleep apnea, COPD, smoker, DM, Bipolar, HTN, anxiety.   Clinical Impression  Pt admitted with above diagnosis and presents to PT with functional limitations due to deficits listed below (See PT problem list). Pt needs skilled PT to maximize independence and safety to allow discharge to SNF. Pt spent 3 months at [facility] from Oct - Dec 31st after transmetatarsal amputation. She was receiving some assist from her godmother who she was staying with. Pt doesn't feel she can return with her godmother at this functional level. Unsure if pt just spending 3 months at SNF will be a barrier to returning to SNF.      Follow Up Recommendations SNF    Equipment Recommendations  None recommended by PT    Recommendations for Other Services       Precautions / Restrictions Precautions Precautions: Fall Restrictions Weight Bearing Restrictions: No      Mobility  Bed Mobility Overal bed mobility: Needs Assistance Bed Mobility: Supine to Sit     Supine to sit: Min assist;HOB elevated     General bed mobility comments: assist to bring LLE off of bed, elevate trunk into sitting and bring hips to EOB  Transfers Overall transfer level: Needs assistance Equipment used: Rolling walker (2 wheeled) Transfers: Sit to/from [facility] Sit to Stand: Min assist Stand pivot transfers: Min assist       General transfer comment: Assist to bring hips up and for balance. Assist to place lt hand onto walker.  Ambulation/Gait Ambulation/Gait assistance: Min assist Gait Distance (Feet): 8 Feet Assistive device: Rolling walker (2 wheeled) Gait Pattern/deviations: Step-through  pattern;Decreased step length - right;Decreased step length - left Gait velocity: decr Gait velocity interpretation: <1.31 ft/sec, indicative of household ambulator General Gait Details: Assist for balance and support. Pt's left leg fatigues quickly.   Stairs            Wheelchair Mobility    Modified Rankin (Stroke Patients Only) Modified Rankin (Stroke Patients Only) Pre-Morbid Rankin Score: Moderate disability Modified Rankin: Moderately severe disability     Balance Overall balance assessment: Needs assistance Sitting-balance support: No upper extremity supported;Feet supported Sitting balance-Leahy Scale: Fair     Standing balance support: Single extremity supported Standing balance-Leahy Scale: Poor Standing balance comment: walker and min guard for static standing                             Pertinent Vitals/Pain Pain Assessment: No/denies pain    Home Living Family/patient expects to be discharged to:: Private residence Living Arrangements: Other (Comment)(has been staying with godmother) Available Help at Discharge: Friend(s)(godmother assisted as able ) Type of Home: Apartment Home Access: Level entry     Home Layout: One level Home Equipment: Walker - 2 wheels;Walker - 4 wheels;Cane - quad      Prior Function Level of Independence: Needs assistance   Gait / Transfers Assistance Needed: modified independent with rolling walker  ADL's / Homemaking Assistance Needed: assist with dressing  Comments: Doesn't always make it to the bathroom on time.     Hand Dominance   Dominant Hand: Right    Extremity/Trunk Assessment   Upper Extremity Assessment Upper Extremity  Assessment: Defer to OT evaluation    Lower Extremity Assessment Lower Extremity Assessment: LLE deficits/detail LLE Deficits / Details: grossly 3-/5 LLE Sensation: decreased light touch       Communication   Communication: No difficulties  Cognition  Arousal/Alertness: Awake/alert Behavior During Therapy: Flat affect Overall Cognitive Status: Within Functional Limits for tasks assessed                                        General Comments      Exercises     Assessment/Plan    PT Assessment Patient needs continued PT services  PT Problem List Decreased strength;Decreased activity tolerance;Decreased balance;Decreased mobility       PT Treatment Interventions DME instruction;Gait training;Functional mobility training;Therapeutic activities;Therapeutic exercise;Balance training;Patient/family education    PT Goals (Current goals can be found in the Care Plan section)  Acute Rehab PT Goals Patient Stated Goal: to get better PT Goal Formulation: With patient Time For Goal Achievement: 09/14/18 Potential to Achieve Goals: Good    Frequency Min 4X/week   Barriers to discharge Decreased caregiver support Intermittent assist from godmother who is in her 51's    Co-evaluation               AM-PAC PT "6 Clicks" Mobility  Outcome Measure Help needed turning from your back to your side while in a flat bed without using bedrails?: A Little Help needed moving from lying on your back to sitting on the side of a flat bed without using bedrails?: A Little Help needed moving to and from a bed to a chair (including a wheelchair)?: A Little Help needed standing up from a chair using your arms (e.g., wheelchair or bedside chair)?: A Little Help needed to walk in hospital room?: A Little Help needed climbing 3-5 steps with a railing? : A Lot 6 Click Score: 17    End of Session Equipment Utilized During Treatment: Gait belt Activity Tolerance: Patient limited by fatigue Patient left: in chair;with call bell/phone within reach Nurse Communication: Mobility status PT Visit Diagnosis: Unsteadiness on feet (R26.81);Hemiplegia and hemiparesis Hemiplegia - Right/Left: Left Hemiplegia - dominant/non-dominant:  Non-dominant Hemiplegia - caused by: Nontraumatic intracerebral hemorrhage    Time: 1515-1550 PT Time Calculation (min) (ACUTE ONLY): 35 min   Charges:   PT Evaluation $PT Eval Moderate Complexity: 1 Mod PT Treatments $Gait Training: 8-22 mins        [place] Pager [phone] Office [place] 08/31/2018, 5:11 PM

## 2018-09-01 DIAGNOSIS — I611 Nontraumatic intracerebral hemorrhage in hemisphere, cortical: Secondary | ICD-10-CM

## 2018-09-01 LAB — GLUCOSE, CAPILLARY
Glucose-Capillary: 141 mg/dL — ABNORMAL HIGH (ref 70–99)
Glucose-Capillary: 96 mg/dL (ref 70–99)
Glucose-Capillary: 180 mg/dL — ABNORMAL HIGH (ref 70–99)
Glucose-Capillary: 133 mg/dL — ABNORMAL HIGH (ref 70–99)

## 2018-09-01 LAB — HEMOGLOBIN A1C
Hgb A1c MFr Bld: 7 % — ABNORMAL HIGH (ref 4.8–5.6)
Mean Plasma Glucose: 154.2 mg/dL

## 2018-09-01 LAB — LIPID PANEL
Cholesterol: 158 mg/dL (ref 0–200)
HDL: 46 mg/dL (ref >40)
LDL Cholesterol: 104 mg/dL — ABNORMAL HIGH (ref 0–99)
Total CHOL/HDL Ratio: 3.4 RATIO
Triglycerides: 42 mg/dL (ref <150)
VLDL: 8 mg/dL (ref 0–40)

## 2018-09-01 MED ORDER — PANTOPRAZOLE SODIUM 40 MG PO TBEC
40.0000 mg | DELAYED_RELEASE_TABLET | Freq: Every day | ORAL | Status: DC
  Administered 2018-09-01 – 2018-09-20 (×20): 40 mg via ORAL
  Filled 2018-09-01 (×20): qty 1

## 2018-09-01 NOTE — Progress Notes (Addendum)
STROKE TEAM PROGRESS NOTE       SUBJECTIVE (INTERVAL HISTORY) She seems to be improved today but remains with left facial paraesthesia and feeling of numbness. There is no family at the bedside. Vital signs are stable. Blood pressure is adequately controlled   OBJECTIVE Vitals:   09/01/18 0630 09/01/18 0645 09/01/18 0700 09/01/18 0800  BP: (!) 135/102 115/81 122/79 125/81  Pulse: 84 83 83 73  Resp: 18 (!) 23 (!) 23 18  Temp:    98.4 F (36.9 C)  TempSrc:    Oral  SpO2: 99% 99% 99% 100%  Weight:      Height:        CBC:  Recent Labs  Lab 08/30/18 1312 08/30/18 1328  WBC 8.5  --   NEUTROABS 3.9  --   HGB 10.2* 11.6*  HCT 35.0* 34.0*  MCV 74.2*  --   PLT 357  --     Basic Metabolic Panel:  Recent Labs  Lab 08/30/18 1312 08/30/18 1328  NA 141 143  K 3.6 3.6  CL 108 108  CO2 24  --   GLUCOSE 139* 135*  BUN 9 13  CREATININE 1.24* 1.10*  CALCIUM 9.5  --     Lipid Panel:     Component Value Date/Time   CHOL 158 09/01/2018 0320   TRIG 42 09/01/2018 0320   HDL 46 09/01/2018 0320   CHOLHDL 3.4 09/01/2018 0320   VLDL 8 09/01/2018 0320   LDLCALC 104 (H) 09/01/2018 0320   HgbA1c:  Lab Results  Component Value Date   HGBA1C 7.0 (H) 09/01/2018   Urine Drug Screen:     Component Value Date/Time   LABOPIA NONE DETECTED 06/22/2018 1708   COCAINSCRNUR NONE DETECTED 06/22/2018 1708   LABBENZ NONE DETECTED 06/22/2018 1708   AMPHETMU NONE DETECTED 06/22/2018 1708   THCU POSITIVE (A) 06/22/2018 1708   LABBARB NONE DETECTED 06/22/2018 1708    Alcohol Level     Component Value Date/Time   ETH <10 01/13/2018 1839    IMAGING   Ct Head Wo Contrast 08/30/2018 IMPRESSION:  1. No change in the acute hematoma or hemorrhagic infarction in the posterior right basal ganglia/lateral thalamus/posterior limb internal capsule measures 2.3 x 1.7 x 1.7 cma (volume = 3.5 cm).  2. Chronic small-vessel ischemic changes of the hemispheric white matter.    Ct Head Wo  Contrast 08/30/2018 IMPRESSION:  1. Apparent hemorrhagic infarct arising in the left posterior lentiform nucleus with slight surrounding edema. No other foci of hemorrhage evident.  2. Small vessel disease in the centra semiovale bilaterally. Prior small infarcts in the left cerebellum and left thalamus. Tiny lacunar infarcts in each anterior lentiform nucleus.  3.  There are foci of arterial vascular calcification.  4.  Areas of paranasal sinus disease.  5.  Probable cerumen in each external auditory canal.    Vas [place] Lower Extremity Venous (dvt) 08/30/2018 Summary:  Right: There is no evidence of deep vein thrombosis in the lower extremity. No cystic structure found in the popliteal fossa.  Left: There is no evidence of deep vein thrombosis in the lower extremity. However, portions of this examination were limited- see technologist comments above. No cystic structure found in the popliteal fossa.    Transthoracic Echocardiogram  06/24/2018 Study Conclusions - Left ventricle: The cavity size was moderately reduced. Wall   thickness was increased in a pattern of severe LVH. Systolic   function was vigorous. The estimated ejection fraction was in the  range of 65% to 70%. Doppler parameters are consistent with   abnormal left ventricular relaxation (grade 1 diastolic   dysfunction). - Aortic valve: There was mild to moderate regurgitation. - Left atrium: The atrium was mildly dilated. - Pericardium, extracardiac: A trivial pericardial effusion was   identified. Impressions: - Compared to report from 2016, LV is thicker. Consider   infiltrative process vs hypertrophic cardiomyopathy.    PHYSICAL EXAM Blood pressure 125/81, pulse 73, temperature 98.4 F (36.9 C), temperature source Oral, resp. rate 18, height 5\' 1"  (1.549 m), weight 88.5 kg, SpO2 100 %.  Pleasant middle aged african american lady not in distress. . Afebrile. Head is nontraumatic. Neck is supple without bruit.     Cardiac exam no murmur or gallop. Lungs are clear to auscultation. Distal pulses are well felt. Neurological Exam ;  Awake alert oriented x 3 normal speech and language. Mild dysarthria. Mild left lower face asymmetry. Tongue midline. No drift. Mild diminished fine finger movements on left. Orbits right over left upper extremity. Mild left grip weak. Decrease sensation on the left face, and arm Normal coordination.   NIHSS 4    ASSESSMENT/PLAN [name] is a 48 y.o. female with history of PE ( 10/19 on xarelto), CVA ( 2016; residual left side weakness), HTN, ASPVD, DM HLD, COPD  presenting with left arm numbness and facial droop. She did not receive IV t-PA due to [place].  Stroke: hemorrhagic infarct arising in the left posterior lentiform nucleus  Resultant  Mild left hemiplegia  CT head - hemorrhagic infarct arising in the left posterior lentiform nucleus - multiple old infarcts  MRI head - not performed  MRA head - not performed  CTA H&N  - not ordered  Bilateral lower extremity venous dopplers - negative for DVT  2D Echo - 06/24/18  - EF 65 - 70%. No cardiac source of emboli identified.   LDL  - not ordered  HgbA1c - will order  UDS - will order  VTE prophylaxis - SCDs  Diet - Heart healthy / carb modified with thin liquids.  clopidogrel 75 mg daily, Xarelto (rivaroxaban) daily but recently ran out of medications prior to admission, now on No antithrombotic  Ongoing aggressive stroke risk factor management  Therapy recommendations:  PT/OT/ ST  Disposition:  Transfer to stroke floor 3W  Hypertension  Stable . Long-term BP goal normotensive   Diabetes  HgbA1c 7.0, goal < 7.0- much improved. 2 yrs previous 12.3  Under management. Appears controlled   Other Stroke Risk Factors  Former cigarette smoker - quit  Substance abuse - crack cocaine  Obesity, Body mass index is 36.87 kg/m., recommend weight loss, diet and exercise as appropriate   Hx  stroke/TIAW  Family hx stroke (father - brothers)  Migraines  Obstructive sleep apnea, on CPAP at home- compliant    Other Active Problems  Mild anemia  Creatinine - 1.10/ 08/30/18  Substance abuse history   Recommend strict control of blood pressure and wean Cardene drip and use IV as needed labetalol or hydralazine.  Resume home blood pressure medications.  We will have to hold Xarelto given her spontaneous intracerebral hemorrhage and lower extremity venous Doppler showed resolution of DVT.  May start aspirin after a few days.  Patient counseled to quit marijuana and smoking.  Mobilize out of bed.  Therapy consults.Check MRI/MRA brain.transfer to neurology floor bed when available. This patient is critically ill and at significant risk of neurological worsening, death and care requires  constant monitoring of vital signs, hemodynamics,respiratory and cardiac monitoring, extensive review of multiple databases, frequent neurological assessment, discussion with family, other specialists and medical decision making of high complexity.I have made any additions or clarifications directly to the above note.This critical care time does not reflect procedure time, or teaching time or supervisory time of PA/NP/Med Resident etc but could involve care discussion time.  I spent 30 minutes of neurocritical care time  in the care of  this patient.   Antony Contras, MD Medical Director [facility] Pager: [phone] 09/01/2018 [facility] PM Hospital day # 2    To contact Stroke Continuity provider, please refer to [email or web address] After hours, contact General Neurology

## 2018-09-02 ENCOUNTER — Other Ambulatory Visit: Payer: Self-pay

## 2018-09-02 ENCOUNTER — Inpatient Hospital Stay (HOSPITAL_COMMUNITY): Payer: Medicare Other

## 2018-09-02 DIAGNOSIS — I2782 Chronic pulmonary embolism: Secondary | ICD-10-CM

## 2018-09-02 DIAGNOSIS — I5189 Other ill-defined heart diseases: Secondary | ICD-10-CM

## 2018-09-02 DIAGNOSIS — I610 Nontraumatic intracerebral hemorrhage in hemisphere, subcortical: Principal | ICD-10-CM

## 2018-09-02 DIAGNOSIS — F1910 Other psychoactive substance abuse, uncomplicated: Secondary | ICD-10-CM

## 2018-09-02 DIAGNOSIS — I10 Essential (primary) hypertension: Secondary | ICD-10-CM

## 2018-09-02 DIAGNOSIS — I619 Nontraumatic intracerebral hemorrhage, unspecified: Secondary | ICD-10-CM

## 2018-09-02 DIAGNOSIS — E119 Type 2 diabetes mellitus without complications: Secondary | ICD-10-CM

## 2018-09-02 DIAGNOSIS — D638 Anemia in other chronic diseases classified elsewhere: Secondary | ICD-10-CM

## 2018-09-02 DIAGNOSIS — E1169 Type 2 diabetes mellitus with other specified complication: Secondary | ICD-10-CM

## 2018-09-02 DIAGNOSIS — E669 Obesity, unspecified: Secondary | ICD-10-CM

## 2018-09-02 DIAGNOSIS — J449 Chronic obstructive pulmonary disease, unspecified: Secondary | ICD-10-CM

## 2018-09-02 LAB — GLUCOSE, CAPILLARY
Glucose-Capillary: 119 mg/dL — ABNORMAL HIGH (ref 70–99)
Glucose-Capillary: 162 mg/dL — ABNORMAL HIGH (ref 70–99)
Glucose-Capillary: 128 mg/dL — ABNORMAL HIGH (ref 70–99)
Glucose-Capillary: 145 mg/dL — ABNORMAL HIGH (ref 70–99)

## 2018-09-02 MED ORDER — GADOBUTROL 1 MMOL/ML IV SOLN
9.0000 mL | Freq: Once | INTRAVENOUS | Status: AC | PRN
  Administered 2018-09-02: 9 mL via INTRAVENOUS

## 2018-09-02 MED ORDER — SILVER SULFADIAZINE 1 % EX CREA
TOPICAL_CREAM | Freq: Every day | CUTANEOUS | Status: DC
  Administered 2018-09-02: 11:00:00 via TOPICAL
  Administered 2018-09-03: 1 via TOPICAL
  Administered 2018-09-04 – 2018-09-12 (×9): via TOPICAL
  Administered 2018-09-13: 1 via TOPICAL
  Administered 2018-09-14: 10:00:00 via TOPICAL
  Administered 2018-09-17: 1 via TOPICAL
  Administered 2018-09-17 – 2018-09-20 (×4): via TOPICAL
  Administered 2018-09-20: 1 via TOPICAL
  Administered 2018-09-21: 10:00:00 via TOPICAL
  Filled 2018-09-02: qty 85

## 2018-09-02 NOTE — Consult Note (Addendum)
[place] Nurse wound consult note Reason for Consult: Consult requested for left foot.  Pt is familiar to [facility] team from previous admission in Oct.  She had a toe amputation and is followed by a physician as an outpatient; she states they have ordered "white cream in a blue round jar," but is unable to recall the name of the product. The description is consistent with Silvadene. Wound type: Chronic full thickness wound to left outer foot, where previous toe amputation occurred. Measurement: 4X1cm Wound bed: 100% dry yellow slough  Periwound: intact skin surrounding Dressing procedure/placement/frequency: Continue present plan of care with Silvadene to provide enzymatic debridement. Pt can resume follow-up with the outpatient physician for the foot wound after discharge.  Discussed plan of care. Please re-consult if further assistance is needed.  Thank-you,  Julien Girt MSN, [place], [place], [place], [place]

## 2018-09-02 NOTE — Progress Notes (Signed)
Pt transported to MRI 

## 2018-09-02 NOTE — Evaluation (Addendum)
Occupational Therapy Evaluation Patient Details Name: Diamond Rice MRN: [id number] DOB: 02-Oct-1970 Today's Date: 09/02/2018    History of Present Illness Pt adm with lt sided weakness and CT showed hemorrhagic infarct arising in the left posterior lentiform nucleus. PMH:  rt CVA, lt transmet amputation 05/28/18, Sleep apnea, COPD, smoker, DM, Bipolar, HTN, anxiety. Pt with recent SNF stay from Oct - Dec 31st after transmetatarsal amputation prior to this admission.    Clinical Impression   This 48 y/o female presents with the above. Pt reports she was only home a couple of days from recent SNF stay prior to this admission. At that time pt reports she was mod independent with RW for functional mobility, receiving some assist for ADL completion, living with her godmother. Pt sleepy this session but is agreeable to working with OT. She currently requires modA for bed mobility, minA for taking few steps along EOB using RW; minA for UB ADL and modA for LB ADL. Pt presenting with LUE weakness and fine motor deficits, decreased activity tolerance. Pt verbalizing wishes to return to level of mod independence with mobility and ADL. She will benefit from continued OT services in both acute and post acute settings to maximize her safety and independence with ADLs and mobility prior to return home. Will follow.     Follow Up Recommendations  CIR;Supervision/Assistance - 24 hour    Equipment Recommendations  3 in 1 bedside commode;Other (comment)(TBD in next venue)           Precautions / Restrictions Precautions Precautions: Fall Restrictions Weight Bearing Restrictions: No      Mobility Bed Mobility Overal bed mobility: Needs Assistance Bed Mobility: Supine to Sit;Sit to Supine Rolling: Min assist Sidelying to sit: Mod assist;HOB elevated Supine to sit: HOB elevated;Mod assist Sit to supine: Mod assist   General bed mobility comments: assist to bring LLE off of bed, elevate trunk into  sitting and bring hips to EOB; assist for LEs onto EOB when returning to supine  Transfers Overall transfer level: Needs assistance Equipment used: Rolling walker (2 wheeled) Transfers: Sit to/from Stand Sit to Stand: Min assist Stand pivot transfers: Min assist       General transfer comment: Assist to bring hips up and for balance. pt requires increased time/effort to place LUE onto RW    Balance Overall balance assessment: Needs assistance Sitting-balance support: No upper extremity supported;Feet supported Sitting balance-Leahy Scale: Fair     Standing balance support: Bilateral upper extremity supported Standing balance-Leahy Scale: Poor Standing balance comment: walker and min guard for static standing                           ADL either performed or assessed with clinical judgement   ADL Overall ADL's : Needs assistance/impaired Eating/Feeding: Set up;Sitting Eating/Feeding Details (indicate cue type and reason): setup to open containers and pour drink Grooming: Wash/dry face;Set up;Sitting;Min guard Grooming Details (indicate cue type and reason): minguard sitting balance Upper Body Bathing: Minimal assistance;Sitting   Lower Body Bathing: Moderate assistance;Sit to/from stand   Upper Body Dressing : Minimal assistance;Sitting   Lower Body Dressing: Moderate assistance;Sit to/from stand Lower Body Dressing Details (indicate cue type and reason): minA standing balance Toilet Transfer: Minimal assistance;Stand-pivot;RW Toilet Transfer Details (indicate cue type and reason): simulated in room; pt taking small steps along EOB this session Toileting- Clothing Manipulation and Hygiene: Moderate assistance;Sit to/from stand       Functional mobility during ADLs:  Minimal assistance;Rolling walker General ADL Comments: pt with LUE weakness as well as generalized weakness and fatigue with activity; expresses wishes to return to PLOF     Vision Baseline  Vision/History: No visual deficits Patient Visual Report: No change from baseline Vision Assessment?: Yes Eye Alignment: Within Functional Limits Ocular Range of Motion: Within Functional Limits Alignment/Gaze Preference: Within Defined Limits Tracking/Visual Pursuits: Able to track stimulus in all quads without difficulty Additional Comments: no apparent visual deficits this session; will continue to assess in functional context     Perception     Praxis      Pertinent Vitals/Pain Pain Assessment: No/denies pain     Hand Dominance Right   Extremity/Trunk Assessment Upper Extremity Assessment Upper Extremity Assessment: LUE deficits/detail LUE Deficits / Details: grossly 2-/5, decreased fine motor and grip strength; reports numb/tingling in fintertips; increased tone noted with attempts at PROM LUE Sensation: decreased light touch LUE Coordination: decreased fine motor;decreased gross motor   Lower Extremity Assessment Lower Extremity Assessment: Defer to PT evaluation       Communication Communication Communication: No difficulties   Cognition Arousal/Alertness: Awake/alert Behavior During Therapy: Flat affect Overall Cognitive Status: Within Functional Limits for tasks assessed                                 General Comments: overall appears appropriate, however pt often quiet and with flat affect, somewhat sleepy this session as well   General Comments       Exercises     Shoulder Instructions      Home Living Family/patient expects to be discharged to:: Private residence Living Arrangements: Other (Comment)(has been staying with godmother) Available Help at Discharge: Friend(s)(godmother assisted as able ) Type of Home: Apartment Home Access: Level entry     Home Layout: One level     Bathroom Shower/Tub: Teacher, early years/pre: Standard     Home Equipment: Environmental consultant - 2 wheels;Walker - 4 wheels;Cane - quad      Lives With:  (god mother)    Prior Functioning/Environment Level of Independence: Needs assistance  Gait / Transfers Assistance Needed: modified independent with rolling walker ADL's / Homemaking Assistance Needed: some assist with dressing    Comments: pt reports she was sponge bathing due to not yet cleared to shower given her recent amputation        OT Problem List: Decreased strength;Decreased range of motion;Decreased activity tolerance;Impaired balance (sitting and/or standing);Decreased coordination;Obesity;Impaired UE functional use;Impaired tone      OT Treatment/Interventions: Self-care/ADL training;Therapeutic exercise;Neuromuscular education;Energy conservation;DME and/or AE instruction;Therapeutic activities;Patient/family education;Balance training    OT Goals(Current goals can be found in the care plan section) Acute Rehab OT Goals Patient Stated Goal: to return to her PLOF OT Goal Formulation: With patient Time For Goal Achievement: 09/16/18 Potential to Achieve Goals: Good  OT Frequency: Min 2X/week   Barriers to D/C:            Co-evaluation              AM-PAC OT "6 Clicks" Daily Activity     Outcome Measure Help from another person eating meals?: A Little Help from another person taking care of personal grooming?: A Little Help from another person toileting, which includes using toliet, bedpan, or urinal?: A Lot Help from another person bathing (including washing, rinsing, drying)?: A Lot Help from another person to put on and taking off regular upper body  clothing?: A Little Help from another person to put on and taking off regular lower body clothing?: A Lot 6 Click Score: 15   End of Session Equipment Utilized During Treatment: Rolling walker Nurse Communication: Mobility status  Activity Tolerance: Patient tolerated treatment well Patient left: in bed;with call bell/phone within reach;with bed alarm set  OT Visit Diagnosis: Muscle weakness (generalized)  (M62.81);Hemiplegia and hemiparesis Hemiplegia - Right/Left: Left Hemiplegia - dominant/non-dominant: Non-Dominant                Time: 8288-3374 OT Time Calculation (min): 26 min Charges:  OT General Charges $OT Visit: 1 Visit OT Evaluation $OT Eval Moderate Complexity: 1 Mod OT Treatments $Self Care/Home Management : 8-22 mins  Lou Cal, OT [facility] Pager [phone] Office [place] 09/02/2018, 4:09 PM

## 2018-09-02 NOTE — Consult Note (Signed)
Physical Medicine and Rehabilitation Consult Reason for Consult: left-sided weakness Referring Physician: Dr. Leonie Man   HPI: Diamond Rice is a 48 y.o.right handed female with history of pulmonary emboli maintained on Xarelto, CVA 2016 with left-sided weakness, hypertension, diabetes mellitus, COPD as well as polysubstance abuse. Per chart review and patient, patient has been staying with her godmother. Recently discharged to skilled nursing facility after left toe amputations 05/28/2018 until 08/27/2018.One level apartment. Modified independent with rolling walker prior to admission. Godmother limited assistance as needed.Presented 08/30/2017 with left-sided weakness and facial droop. Reports the patient had not taken her medications for the past 3 days due to finances. Blood pressure 170/98, creatinine 1.10. Cranial CT scan showed apparent hemorrhagic infarction arising in the left posterior lentiform nucleus with slight surrounding edema. Prior infarcts in the left cerebellum and left thalamus. Tiny lacunar infarcts in each anterior lentiform nucleus.  Repeat CT reviewed, showing right basal ganglia hemorrhage.  Most recent echocardiogram with ejection fraction of 93% grade 1 diastolic dysfunction. Lower extremity Dopplers negative for DVT.Urine drug screen pending. Current plan is hold on Xarelto due to intracerebral hemorrhage and considering plans on starting aspirin in a few days. MRSA PCR screening positive for contact precautions. Therapy evaluations completed with recommendations of physical medicine rehabilitation consult.   Review of Systems  Constitutional: Negative for chills and fever.  HENT: Negative for hearing loss.   Eyes: Negative for blurred vision and double vision.  Respiratory: Positive for shortness of breath. Negative for cough.   Cardiovascular: Negative for chest pain and palpitations.  Gastrointestinal: Positive for constipation. Negative for nausea and  vomiting.  Genitourinary: Negative for dysuria, flank pain and hematuria.  Musculoskeletal: Positive for back pain.  Skin: Negative for rash.  Neurological: Positive for sensory change, speech change, focal weakness and headaches.  Psychiatric/Behavioral: Positive for depression.       Anxiety  All other systems reviewed and are negative.  Past Medical History:  Diagnosis Date  . Anxiety   . Chronic lower back pain 04/17/2012   "just got over some; catched when I walked"  . COPD (chronic obstructive pulmonary disease) ([place])   . Critical lower limb ischemia 05/27/2018  . Depression   . Headache(784.0) 04/17/2012   "~ qod; lately waking up in am w/one"  . High cholesterol   . Hypertension   . Migraines 04/17/2012  . Obesity   . Sleep apnea    CPAP  . Stroke ([place])   . Type II diabetes mellitus ([place]) 08/28/2002   Past Surgical History:  Procedure Laterality Date  . ABDOMINAL AORTOGRAM W/LOWER EXTREMITY Left 05/27/2018   Procedure: ABDOMINAL AORTOGRAM W/LOWER EXTREMITY Runoff and Possible Intervention;  Surgeon: Waynetta Sandy, MD;  Location: [facility];  Service: Cardiovascular;  Laterality: Left;  . APPLICATION OF WOUND VAC Left 06/06/2018   Procedure: APPLICATION OF WOUND VAC;  Surgeon: Waynetta Sandy, MD;  Location: [facility];  Service: Vascular;  Laterality: Left;  . BIOPSY  04/04/2018   Procedure: BIOPSY;  Surgeon: [name], MD;  Location: [facility] ENDOSCOPY;  Service: Endoscopy;;  . CARDIAC CATHETERIZATION  ~ 2011  . COLONOSCOPY N/A 04/04/2018   Procedure: COLONOSCOPY;  Surgeon: [name], MD;  Location: [facility] ENDOSCOPY;  Service: Endoscopy;  Laterality: N/A;  . LOWER EXTREMITY ANGIOGRAM Left 05/27/2018  . PERIPHERAL VASCULAR INTERVENTION Left 05/27/2018   Procedure: PERIPHERAL VASCULAR INTERVENTION;  Surgeon: Waynetta Sandy, MD;  Location: [facility];  Service: Cardiovascular;  Laterality: Left;  .  POLYPECTOMY  04/04/2018   Procedure:  POLYPECTOMY;  Surgeon: [name], MD;  Location: [facility] ENDOSCOPY;  Service: Endoscopy;;  . TRANSMETATARSAL AMPUTATION Left 05/28/2018   Procedure: AMPUTATION TOES THREE, FOUR AND FIVE on left foot;  Surgeon: Waynetta Sandy, MD;  Location: [facility];  Service: Vascular;  Laterality: Left;  . WOUND DEBRIDEMENT Left 06/06/2018   Procedure: DEBRIDEMENT WOUND LEFT FOOT;  Surgeon: Waynetta Sandy, MD;  Location: [name] OR;  Service: Vascular;  Laterality: Left;   Family History  Problem Relation Age of Onset  . Stroke Brother 62  . Stroke Brother 83  . Heart attack Mother 8  . Stroke Father 32   Social History:  reports that she has quit smoking. Her smoking use included cigarettes. She has a 7.00 pack-year smoking history. She has never used smokeless tobacco. She reports current drug use. Drugs: Marijuana and "Crack" cocaine. She reports that she does not drink alcohol. Allergies:  Allergies  Allergen Reactions  . Morphine And Related Hives  . Metformin Diarrhea   Medications Prior to Admission  Medication Sig Dispense Refill  . ARIPiprazole (ABILIFY) 15 MG tablet Take 1 tablet (15 mg total) by mouth at bedtime. 30 tablet 0  . cloNIDine (CATAPRES) 0.3 MG tablet Take 1 tablet (0.3 mg total) by mouth 3 (three) times daily. 90 tablet 0  . clopidogrel (PLAVIX) 75 MG tablet Take 1 tablet (75 mg total) by mouth daily. 30 tablet 0  . ferrous sulfate 325 (65 FE) MG tablet Take 1 tablet (325 mg total) by mouth daily with breakfast. 30 tablet 0  . furosemide (LASIX) 40 MG tablet Take 1 tablet (40 mg total) by mouth daily. 30 tablet 0  . gabapentin (NEURONTIN) 100 MG capsule Take 1 capsule (100 mg total) by mouth 2 (two) times daily. 100 mg at 1 pm and 100 mg qhs (Patient taking differently: Take 100 mg by mouth See admin instructions. Take 100 mg by mouth two times a day- 1 PM and at bedtime) 60 capsule 0  . hydrALAZINE (APRESOLINE) 50 MG tablet Take 1 tablet (50 mg total) by mouth 4  (four) times daily. 120 tablet 0  . Insulin Detemir (LEVEMIR) 100 UNIT/ML Pen Inject 15 Units into the skin 2 (two) times daily. (Patient taking differently: Inject 15 Units into the skin See admin instructions. Inject 15 units into the skin two times a day- morning and bedtime) 15 mL 0  . lactulose (CHRONULAC) 10 GM/15ML solution Take 30 mLs (20 g total) by mouth daily. 236 mL 0  . lisinopril (PRINIVIL,ZESTRIL) 30 MG tablet Take 1 tablet (30 mg total) by mouth daily. 30 tablet 0  . Menthol, Topical Analgesic, (BIOFREEZE) 4 % GEL Apply 1 application topically every 6 (six) hours as needed (Moderate pain). 89 mL 0  . metoprolol tartrate (LOPRESSOR) 25 MG tablet Take 1 tablet (25 mg total) by mouth 2 (two) times daily. 60 tablet 0  . pentoxifylline (TRENTAL) 400 MG CR tablet Take 1 tablet (400 mg total) by mouth 3 (three) times daily with meals. 90 tablet 0  . potassium chloride SA (K-DUR,KLOR-CON) 20 MEQ tablet Take 1 tablet (20 mEq total) by mouth daily. 30 tablet 0  . rivaroxaban (XARELTO) 20 MG TABS tablet Take 1 tablet (20 mg total) by mouth daily with supper. 30 tablet 0  . Melatonin 3 MG TABS Take 3 mg by mouth at bedtime.     Marland Kitchen oxycodone (OXY-IR) 5 MG capsule Take 1 capsule (5 mg total) by mouth  every 8 (eight) hours as needed for pain. 21 capsule 0  . polyethylene glycol (MIRALAX / GLYCOLAX) packet Take 17 g by mouth daily.    . sennosides-docusate sodium (SENOKOT-S) 8.6-50 MG tablet Take 2 tablets by mouth 2 (two) times daily.      Home: Home Living Family/patient expects to be discharged to:: Private residence Living Arrangements: Other (Comment)(has been staying with godmother) Available Help at Discharge: Friend(s)(godmother assisted as able ) Type of Home: Apartment Home Access: Level entry Home Layout: One level Bathroom Shower/Tub: Chiropodist: [place]: Environmental consultant - 2 wheels, Environmental consultant - 4 wheels, [employer] - quad  Functional History: Prior  Function Level of Independence: Needs assistance Gait / Transfers Assistance Needed: modified independent with rolling walker ADL's / Homemaking Assistance Needed: assist with dressing Comments: Doesn't always make it to the bathroom on time. Functional Status:  Mobility: Bed Mobility Overal bed mobility: Needs Assistance Bed Mobility: Rolling, Sidelying to Sit Rolling: Min assist Sidelying to sit: Mod assist, HOB elevated Supine to sit: Min assist, HOB elevated General bed mobility comments: cues for technique, assist for reaching rail and heavy lifting assist to lift trunk, patient with increased time needed  Transfers Overall transfer level: Needs assistance Equipment used: Rolling walker (2 wheeled) Transfers: Sit to/from Stand Sit to Stand: Min assist Stand pivot transfers: Min assist General transfer comment: stood for orthostatic testing and c/o feeling weak, BP 010 systolic standing 932 seated. pivot to chair with walker, cues for stepping and increased time, cues for hand placement and assist to place L hand on walker Ambulation/Gait Ambulation/Gait assistance: Min assist Gait Distance (Feet): 8 Feet Assistive device: Rolling walker (2 wheeled) Gait Pattern/deviations: Step-through pattern, Decreased step length - right, Decreased step length - left General Gait Details: states unable due to feeling weak Gait velocity: decr Gait velocity interpretation: <1.31 ft/sec, indicative of household ambulator    ADL:    Cognition: Cognition Overall Cognitive Status: Within Functional Limits for tasks assessed Orientation Level: Oriented X4 Cognition Arousal/Alertness: Awake/alert Behavior During Therapy: Flat affect Overall Cognitive Status: Within Functional Limits for tasks assessed  Blood pressure 123/76, pulse 77, temperature 98.2 F (36.8 C), temperature source Oral, resp. rate 13, height 5\' 1"  (1.549 m), weight 88.5 kg, SpO2 99 %. Physical Exam  Vitals  reviewed. Constitutional: She appears well-developed.  Obese  HENT:  Head: Normocephalic and atraumatic.  Eyes: EOM are normal. Right eye exhibits no discharge. Left eye exhibits no discharge.  Pupils reactive to light  Neck: Normal range of motion. Neck supple. No thyromegaly present.  Cardiovascular: Normal rate and regular rhythm.  Respiratory: Effort normal and breath sounds normal. No respiratory distress.  GI: Soft. Bowel sounds are normal.  Musculoskeletal:     Comments: Left foot third through fifth toe amputations  Neurological: She is alert.  She provides her name and age and the appropriate year. Follow simple commands. Motor: Right upper extremity: 5/5 proximal distal Right lower extremity: 4+/5 proximal distal Left upper extremity: Shoulder abduction 2-/5, distally 4-/5 Left lower extremity: 4+/5 Sensation diminished to light touch left face Left facial weakness Dysarthria  Skin:  Left foot third through fifth toe amputation sites healing  Psychiatric: She has a normal mood and affect. Her behavior is normal.    Results for orders placed or performed during the hospital encounter of 08/30/18 (from the past 24 hour(s))  Glucose, capillary     Status: Abnormal   Collection Time: 09/01/18  4:29 PM  Result Value Ref  Range   Glucose-Capillary 180 (H) 70 - 99 mg/dL   Comment 1 Notify RN    Comment 2 Document in Chart   Glucose, capillary     Status: Abnormal   Collection Time: 09/01/18  9:34 PM  Result Value Ref Range   Glucose-Capillary 133 (H) 70 - 99 mg/dL  Glucose, capillary     Status: Abnormal   Collection Time: 09/02/18  6:18 AM  Result Value Ref Range   Glucose-Capillary 119 (H) 70 - 99 mg/dL   Comment 1 Notify RN   Glucose, capillary     Status: Abnormal   Collection Time: 09/02/18 11:44 AM  Result Value Ref Range   Glucose-Capillary 162 (H) 70 - 99 mg/dL   Comment 1 Notify RN    Comment 2 Document in Chart    No results  found.  Assessment/Plan: Diagnosis: Right basal ganglia hemorrhage Labs and images (see above) independently reviewed.  Records reviewed and summated above. Stroke: Continue secondary stroke prophylaxis and Risk Factor Modification listed below:   Blood Pressure Management:  Continue current medication with prn's with permisive HTN per primary team Diabetes management:   Left sided hemiparesis:  Motor recovery: Fluoxetine  1. Does the need for close, 24 hr/day medical supervision in concert with the patient's rehab needs make it unreasonable for this patient to be served in a less intensive setting? Yes  2. Co-Morbidities requiring supervision/potential complications: diastolic dysfunction (monitor for signs and symptoms of fluid overload), pulmonary emboli (hold Xarelto due to [place]), CVA 2016 with left-sided weakness, HTN (monitor and provide prns in accordance with increased physical exertion and pain), DM (Monitor in accordance with exercise and adjust meds as necessary), COPD, polysubstance abuse, anemia of chronic disease (repeat labs, transfuse to ensure appropriate perfusion for increased activity tolerance) 3. Due to safety, disease management and patient education, does the patient require 24 hr/day rehab nursing? Potentially 4. Does the patient require coordinated care of a physician, rehab nurse, PT (1-2 hrs/day, 5 days/week) and OT (1-2 hrs/day, 5 days/week) to address physical and functional deficits in the context of the above medical diagnosis(es)? Potentially Addressing deficits in the following areas: balance, endurance, locomotion, strength, transferring, bathing, dressing, toileting and psychosocial support 5. Can the patient actively participate in an intensive therapy program of at least 3 hrs of therapy per day at least 5 days per week? Yes 6. The potential for patient to make measurable gains while on inpatient rehab is excellent 7. Anticipated functional outcomes upon  discharge from inpatient rehab are supervision  with PT, supervision with OT, n/a with SLP. 8. Estimated rehab length of stay to reach the above functional goals is: 13-16 days. 9. Anticipated D/C setting: Home 10. Anticipated post D/C treatments: HH therapy and Home excercise program 11. Overall Rehab/Functional Prognosis: good  RECOMMENDATIONS: This patient's condition is appropriate for continued rehabilitative care in the following setting: CIR Patient has agreed to participate in recommended program. Yes Note that insurance prior authorization may be required for reimbursement for recommended care.  Comment: Rehab Admissions Coordinator to follow up.   I have personally performed a face to face diagnostic evaluation, including, but not limited to relevant history and physical exam findings, of this patient and developed relevant assessment and plan.  Additionally, I have reviewed and concur with the physician assistant's documentation above.   Delice Lesch, MD, ABPMR Lavon Paganini Angiulli, PA-C 09/02/2018

## 2018-09-02 NOTE — Evaluation (Signed)
Speech Language Pathology Evaluation Patient Details Name: Diamond Rice MRN: [id number] DOB: 10-Oct-1970 Today's Date: 09/02/2018 Time: 1410-1420 SLP Time Calculation (min) (ACUTE ONLY): 10 min  Problem List:  Patient Active Problem List   Diagnosis Date Noted  . ICH (intracerebral hemorrhage) ([place]) 08/30/2018  . Hypertensive heart disease without heart failure 07/04/2018  . Nonrheumatic aortic valve insufficiency 07/04/2018  . Esophageal abnormality   . Class 2 severe obesity due to excess calories with serious comorbidity and body mass index (BMI) of 35.0 to 35.9 in adult [facility])   . Peripheral vascular disease ([place])   . History of osteomyelitis   . History of amputation of lesser toe ([place])   . Pulmonary embolism ([place]) 06/22/2018  . Microcytic anemia 06/17/2018  . At risk for adverse drug reaction 06/11/2018  . Critical lower limb ischemia 05/27/2018  . GI bleeding 04/02/2018  . Acute renal failure (ARF) ([place]) 04/02/2018  . Hypokalemia 04/02/2018  . Polysubstance abuse ([place]) 04/02/2018  . Diabetic foot ulcer ([place]) 04/02/2018  . Cannabis use disorder, moderate, dependence ([place]) 05/19/2017  . Cocaine use disorder, moderate, dependence ([place]) 05/19/2017  . Schizoaffective disorder, bipolar type ([place]) 10/07/2015  . OSA (obstructive sleep apnea) 06/09/2015  . Vitamin D deficiency 01/06/2015  . Bilateral knee pain 01/05/2015  . Numbness and tingling in right hand 01/05/2015  . Insomnia 12/07/2014  . Right-sided lacunar infarction ([place]) 09/15/2014  . Left hemiparesis ([place]) 09/15/2014  . Dyspnea   . Back pain 09/11/2013  . Psychoactive substance-induced organic mood disorder ([place]) 05/28/2013  . Cocaine abuse with cocaine-induced mood disorder ([place]) 05/28/2013  . Cannabis dependence with cannabis-induced anxiety disorder ([place]) 05/28/2013  . Morbid obesity with BMI of 45.0-49.9, adult ([place]) 04/25/2012  . Chest pain 04/17/2012  . Hypertension 04/17/2012  . DM (diabetes mellitus)  with peripheral vascular complication ([place]) 27/01/2375  . Hyperlipidemia 04/17/2012  . Tobacco use 04/17/2012   Past Medical History:  Past Medical History:  Diagnosis Date  . Anxiety   . Chronic lower back pain 04/17/2012   "just got over some; catched when I walked"  . COPD (chronic obstructive pulmonary disease) ([place])   . Critical lower limb ischemia 05/27/2018  . Depression   . Headache(784.0) 04/17/2012   "~ qod; lately waking up in am w/one"  . High cholesterol   . Hypertension   . Migraines 04/17/2012  . Obesity   . Sleep apnea    CPAP  . Stroke ([place])   . Type II diabetes mellitus ([place]) 08/28/2002   Past Surgical History:  Past Surgical History:  Procedure Laterality Date  . ABDOMINAL AORTOGRAM W/LOWER EXTREMITY Left 05/27/2018   Procedure: ABDOMINAL AORTOGRAM W/LOWER EXTREMITY [place] and Possible Intervention;  Surgeon: Waynetta Sandy, MD;  Location: [facility];  Service: Cardiovascular;  Laterality: Left;  . APPLICATION OF WOUND VAC Left 06/06/2018   Procedure: APPLICATION OF WOUND VAC;  Surgeon: Waynetta Sandy, MD;  Location: [facility];  Service: Vascular;  Laterality: Left;  . BIOPSY  04/04/2018   Procedure: BIOPSY;  Surgeon: [name], MD;  Location: [facility] ENDOSCOPY;  Service: Endoscopy;;  . CARDIAC CATHETERIZATION  ~ 2011  . COLONOSCOPY N/A 04/04/2018   Procedure: COLONOSCOPY;  Surgeon: [name], MD;  Location: [facility] ENDOSCOPY;  Service: Endoscopy;  Laterality: N/A;  . LOWER EXTREMITY ANGIOGRAM Left 05/27/2018  . PERIPHERAL VASCULAR INTERVENTION Left 05/27/2018   Procedure: PERIPHERAL VASCULAR INTERVENTION;  Surgeon: Waynetta Sandy, MD;  Location: [facility];  Service: Cardiovascular;  Laterality: Left;  .  POLYPECTOMY  04/04/2018   Procedure: POLYPECTOMY;  Surgeon: [name], MD;  Location: [facility] ENDOSCOPY;  Service: Endoscopy;;  . TRANSMETATARSAL AMPUTATION Left 05/28/2018   Procedure: AMPUTATION TOES THREE, FOUR AND FIVE on  left foot;  Surgeon: Waynetta Sandy, MD;  Location: [facility];  Service: Vascular;  Laterality: Left;  . WOUND DEBRIDEMENT Left 06/06/2018   Procedure: DEBRIDEMENT WOUND LEFT FOOT;  Surgeon: Waynetta Sandy, MD;  Location: [facility];  Service: Vascular;  Laterality: Left;   HPI:  Pt adm with lt sided weakness and CT showed hemorrhagic infarct arising in the left posterior lentiform nucleus. PMH:  rt CVA, lt transmet amputation 05/28/18, Sleep apnea, COPD, smoker, DM, Bipolar, HTN, anxiety.   Assessment / Plan / Recommendation Clinical Impression  Pt with previous CVA in 2016 with CIR admission and discharge at Mod I level. Currently pt presents with lethargy which prevented completion of formal cognitive evaluation. Informally, pt's speech is intelligible, she is oriented x 4 with increased processing noted. Pt described her mind as feeling "foggy." Given that pt has resided at [facility] for last 3 months, it is dfficult to clearly assess baseline cognitive ability. Would recommend skilled ST to follow pt for more throughout cognitive assessment as pt's alertness improves.    SLP Assessment  SLP Recommendation/Assessment: Patient needs continued Speech Lanaguage Pathology Services SLP Visit Diagnosis: Cognitive communication deficit (R41.841)    Follow Up Recommendations  Skilled Nursing facility(pt doesn't have family support to provide consistent care)    Frequency and Duration min 2x/week  2 weeks      SLP Evaluation Cognition  Overall Cognitive Status: Difficult to assess Arousal/Alertness: Suspect due to medications Orientation Level: Oriented X4 Attention: Sustained Sustained Attention: Impaired Sustained Attention Impairment: Verbal complex;Functional complex       Comprehension  Auditory Comprehension Overall Auditory Comprehension: Appears within functional limits for tasks assessed Yes/No Questions: Within Functional Limits Commands: Not tested Interfering  Components: (lethargy) EffectiveTechniques: Repetition Visual Recognition/Discrimination Discrimination: Not tested Reading Comprehension Reading Status: Not tested    Expression Expression Primary Mode of Expression: Verbal Verbal Expression Overall Verbal Expression: Appears within functional limits for tasks assessed Written Expression Dominant Hand: Right Written Expression: Not tested   Oral / Motor  Oral Motor/Sensory Function Overall Oral Motor/Sensory Function: Within functional limits Motor Speech Overall Motor Speech: Appears within functional limits for tasks assessed Respiration: Within functional limits Phonation: Normal Resonance: Within functional limits Articulation: Within functional limitis Intelligibility: Intelligible Motor Planning: Witnin functional limits Motor Speech Errors: Not applicable   GO                    Raju Coppolino 09/02/2018, 2:23 PM

## 2018-09-02 NOTE — Progress Notes (Signed)
Inpatient Rehabilitation Admissions Coordinator   Patient was screened by Cleatrice Burke for appropriateness for an Inpatient Acute Rehab Consult per PT recs.  At this time, we are recommending Inpatient Rehab consult.  Cleatrice Burke RN MSN 09/02/2018, 1:02 PM  I can be reached at [phone].

## 2018-09-02 NOTE — Progress Notes (Signed)
STROKE TEAM PROGRESS NOTE   SUBJECTIVE (INTERVAL HISTORY) Patient did not have CPAP on correctly when the nurse checked on her this morning.  She has been sleepy ever since.  Currently, sitting up in the chair, awake and interactive.  Plan resume CPAP at night tonight.  Good rehab candidate.  Consult in place.  OBJECTIVE Vitals:   09/02/18 0439 09/02/18 0855 09/02/18 0859 09/02/18 1149  BP:  121/89 121/89 123/76  Pulse:   77 77  Resp:   16 13  Temp:   98.3 F (36.8 C) 98.2 F (36.8 C)  TempSrc:   Oral Oral  SpO2: 97%  98% 99%  Weight:      Height:        CBC:  Recent Labs  Lab 08/30/18 1312 08/30/18 1328  WBC 8.5  --   NEUTROABS 3.9  --   HGB 10.2* 11.6*  HCT 35.0* 34.0*  MCV 74.2*  --   PLT 357  --     Basic Metabolic Panel:  Recent Labs  Lab 08/30/18 1312 08/30/18 1328  NA 141 143  K 3.6 3.6  CL 108 108  CO2 24  --   GLUCOSE 139* 135*  BUN 9 13  CREATININE 1.24* 1.10*  CALCIUM 9.5  --     PHYSICAL EXAM per Dr. Leonie Man Blood pressure 123/76, pulse 77, temperature 98.2 F (36.8 C), temperature source Oral, resp. rate 13, height 5\' 1"  (1.549 m), weight 88.5 kg, SpO2 99 %. Pleasant middle aged african american lady not in distress. . Afebrile. Head is nontraumatic. Neck is supple without bruit.    Cardiac exam no murmur or gallop. Lungs are clear to auscultation. Distal pulses are well felt. Neurological Exam ;  Awake alert oriented x 3 normal speech and language. Mild dysarthria. Mild left lower face asymmetry. Tongue midline. No drift. Mild diminished fine finger movements on left. Orbits right over left upper extremity. Mild left grip weak. Decrease sensation on the left face, and arm Normal coordination.    ASSESSMENT/PLAN Ms. Destiny Hagin is a 48 y.o. female with history of PE (10/19 on xarelto), CVA ( 2016; residual left side weakness), HTN, ASPVD, DM HLD, COPD  presenting with left arm numbness and facial droop. She did not receive IV t-PA due to  [place].  Stroke: right posterior BG/PLIC hemorrhage vs hemorrhagic infarct  CT head 1/30 1330 - hemorrhagic infarct arising in the right  posterior lentiform nucleus - multiple old infarcts  CT head 1/30 2024 - no change in hematoma/hemorrhagic infarct posterior right basal ganglia/lateral thalamus/posterior limb internal capsule.  Small vessel disease  MRI/MRA head and neck pending   Bilateral lower extremity venous dopplers - negative for DVT  2D Echo - 06/24/18  - EF 65 - 70%. No cardiac source of emboli identified.   LDL  104  HgbA1c 7.0  UDS pending   VTE prophylaxis - SCDs  Diet - Heart healthy / carb modified with thin liquids.  clopidogrel 75 mg daily, Xarelto (rivaroxaban) daily but recently ran out of medications prior to admission, now on No antithrombotic. Anticipate add aspirin if remains stable after 1 week.  Therapy recommendations:  CIR and SNF. CIR consult in place  Disposition:  pending   Pulmonary embolus  Diagnosed 05/2018 - provoked event as she had undergone a left 3-5 digit toe amputation on 10/1 with only physical activity being PT, otherwise she does not walk.  Planned tx x 3 months with AC then change to aspirin and plavix  On xarleto, but ran out and did not refill dt lack of money  Hypertension  Initial blood pressure goal less than 140   Blood pressure goal now less than 160   Home medications: Catapres 0.3 3 times daily, hydralazine 50 mg 4 times daily, lisinopril 30 mg a day, metoprolol 25 mg a day  Home meds all renewed in hospital  BP now low 87/68 - 133/82 . Long-term BP goal normotensive  Hyperlipidemia, LDL 104, on no statin PTA, now on no statin given hemorrhage  Diabetes  HgbA1c 7.0, goal < 7.0 - much improved. 2 yrs previous 12.3  Under management. Appears controlled   Other Stroke Risk Factors  Former cigarette smoker - quit  Substance abuse - crack cocaine. UDS pending   Obesity, Body mass index is 36.87 kg/m.,  recommend weight loss, diet and exercise as appropriate   Hx stroke/TIA  02/3219 small R PLIC infarct d/t SVD. Started on aspirin 325. D/c to CIR. HTN. HLD, DB, THC use, ETOH use, morbid obesity, OSA - noncompliant with medications  Family hx stroke (father - brothers)  Migraines  Obstructive sleep apnea, on CPAP at home- compliant - using as IP  LVH - new in Oct.  Secondary to hypertensive heart disease.  No further treatment per cardiology 06/2018   Sarcoidosis  Aortic insufficiency with plans for repeat echo in 1 year per cardiology 06/2018  Other Active Problems  Microcytic anemia  Creatinine - 1.10/ 08/30/18  Substance abuse history  Hx Left foot lesser toe amputation using Silvadene cream.  WOC RN consulted.  Continue current plan of care.  Follow by outpatient physician as PTA  Hx GIB, history of hematochezia August 2019 with colonoscopy revealing several ulcers and polyps.  Refer to GI 05/2018  Schizoaffective disorder, bipolar type  Neuropathy  Chronic constipation  Contact precautions for MRSA  Burnetta Sabin, MSN, APRN, ANVP-BC, AGPCNP-BC Advanced Practice Stroke Nurse [place] for Schedule & Pager information 09/02/2018 2:37 PM   Hospital day # 3   To contact Stroke Continuity provider, please refer to [email or web address] After hours, contact General Neurology

## 2018-09-02 NOTE — Progress Notes (Signed)
STROKE TEAM PROGRESS NOTE   SUBJECTIVE (INTERVAL HISTORY) Patient did not have CPAP on correctly when the nurse checked on her this morning.  She has been sleepy ever since.  Currently, sitting up in the chair, awake and interactive.  Plan resume CPAP at night tonight.  Good rehab candidate.  Consult in place.  OBJECTIVE Vitals:   09/02/18 0439 09/02/18 0855 09/02/18 0859 09/02/18 1149  BP:  121/89 121/89 123/76  Pulse:   77 77  Resp:   16 13  Temp:   98.3 F (36.8 C) 98.2 F (36.8 C)  TempSrc:   Oral Oral  SpO2: 97%  98% 99%  Weight:      Height:        CBC:  Recent Labs  Lab 08/30/18 1312 08/30/18 1328  WBC 8.5  --   NEUTROABS 3.9  --   HGB 10.2* 11.6*  HCT 35.0* 34.0*  MCV 74.2*  --   PLT 357  --     Basic Metabolic Panel:  Recent Labs  Lab 08/30/18 1312 08/30/18 1328  NA 141 143  K 3.6 3.6  CL 108 108  CO2 24  --   GLUCOSE 139* 135*  BUN 9 13  CREATININE 1.24* 1.10*  CALCIUM 9.5  --     PHYSICAL EXAM per Dr. Leonie Man Blood pressure 123/76, pulse 77, temperature 98.2 F (36.8 C), temperature source Oral, resp. rate 13, height 5\' 1"  (1.549 m), weight 88.5 kg, SpO2 99 %. Pleasant middle aged african american lady not in distress. . Afebrile. Head is nontraumatic. Neck is supple without bruit.    Cardiac exam no murmur or gallop. Lungs are clear to auscultation. Distal pulses are well felt. Neurological Exam ;  Awake alert oriented x 3 normal speech and language. Mild dysarthria. Mild left lower face asymmetry. Tongue midline. No drift. Mild diminished fine finger movements on left. Orbits right over left upper extremity. Mild left grip weak. Decrease sensation on the left face, and arm Normal coordination.    ASSESSMENT/PLAN [name] is a 48 y.o. female with history of PE (10/19 on xarelto), CVA ( 2016; residual left side weakness), HTN, ASPVD, DM HLD, COPD  presenting with left arm numbness and facial droop. She did not receive IV t-PA due to  [place].  Stroke: right posterior BG/PLIC hemorrhage vs hemorrhagic infarct  CT head 1/30 1330 - hemorrhagic infarct arising in the right  posterior lentiform nucleus - multiple old infarcts  CT head 1/30 2024 - no change in hematoma/hemorrhagic infarct posterior right basal ganglia/lateral thalamus/posterior limb internal capsule.  Small vessel disease  MRI/MRA head and neck pending   Bilateral lower extremity venous dopplers - negative for DVT  2D Echo - 06/24/18  - EF 65 - 70%. No cardiac source of emboli identified.   LDL  104  HgbA1c 7.0  UDS pending   VTE prophylaxis - SCDs  Diet - Heart healthy / carb modified with thin liquids.  clopidogrel 75 mg daily, Xarelto (rivaroxaban) daily but recently ran out of medications prior to admission, now on No antithrombotic. Anticipate add aspirin if remains stable after 1 week.  Therapy recommendations:  CIR and SNF. CIR consult in place  Disposition:  pending   Pulmonary embolus  Diagnosed 05/2018 - provoked event as she had undergone a left 3-5 digit toe amputation on 10/1 with only physical activity being PT, otherwise she does not walk.  Planned tx x 3 months with AC then change to aspirin and plavix  On xarleto, but ran out and did not refill dt lack of money  Hypertension  Initial blood pressure goal less than 140   Blood pressure goal now less than 160   Home medications: Catapres 0.3 3 times daily, hydralazine 50 mg 4 times daily, lisinopril 30 mg a day, metoprolol 25 mg a day  Home meds all renewed in hospital  BP now low 87/68 - 133/82 . Long-term BP goal normotensive  Hyperlipidemia, LDL 104, on no statin PTA, now on no statin given hemorrhage  Diabetes  HgbA1c 7.0, goal < 7.0 - much improved. 2 yrs previous 12.3  Under management. Appears controlled   Other Stroke Risk Factors  Former cigarette smoker - quit  Substance abuse - crack cocaine. UDS pending   Obesity, Body mass index is 36.87 kg/m.,  recommend weight loss, diet and exercise as appropriate   Hx stroke/TIA  03/3150 small R PLIC infarct d/t SVD. Started on aspirin 325. D/c to CIR. HTN. HLD, DB, THC use, ETOH use, morbid obesity, OSA - noncompliant with medications  Family hx stroke (father - brothers)  Migraines  Obstructive sleep apnea, on CPAP at home- compliant - using as IP  LVH - new in Oct.  Secondary to hypertensive heart disease.  No further treatment per cardiology 06/2018   Sarcoidosis  Aortic insufficiency with plans for repeat echo in 1 year per cardiology 06/2018  Other Active Problems  Microcytic anemia  Creatinine - 1.10/ 08/30/18  Substance abuse history  Hx Left foot lesser toe amputation using Silvadene cream.  WOC RN consulted.  Continue current plan of care.  Follow by outpatient physician as PTA  Hx GIB, history of hematochezia August 2019 with colonoscopy revealing several ulcers and polyps.  Refer to GI 05/2018  Schizoaffective disorder, bipolar type  Neuropathy  Chronic constipation  Contact precautions for MRSA Anticipated transfer to rehabilitation in the next few days when bed available. Discussed with patient and nurse at the bedside and answered questions. Antony Contras, , MD 09/02/2018 3:10 PM   Hospital day # 3   To contact Stroke Continuity provider, please refer to [email or web address] After hours, contact General Neurology

## 2018-09-02 NOTE — Progress Notes (Signed)
Physical Therapy Treatment Patient Details Name: Diamond Rice MRN: [id number] DOB: 04-08-1971 Today's Date: 09/02/2018    History of Present Illness Pt adm with lt sided weakness and CT showed hemorrhagic infarct arising in the left posterior lentiform nucleus. PMH:  rt CVA, lt transmet amputation 05/28/18, Sleep apnea, COPD, smoker, DM, Bipolar, HTN, anxiety.     PT Comments    Patient progressing slowly, feels as if her medication has made her more weak today.  Also RN reports got C-pap at 4 am.  Feel she is appropriate for CIR level rehab upon d/c.  PT to follow acutely.    Follow Up Recommendations  CIR     Equipment Recommendations  None recommended by PT    Recommendations for Other Services Rehab consult     Precautions / Restrictions Precautions Precautions: Fall    Mobility  Bed Mobility Overal bed mobility: Needs Assistance Bed Mobility: Rolling;Sidelying to Sit Rolling: Min assist Sidelying to sit: Mod assist;HOB elevated       General bed mobility comments: cues for technique, assist for reaching rail and heavy lifting assist to lift trunk, patient with increased time needed   Transfers Overall transfer level: Needs assistance Equipment used: Rolling walker (2 wheeled) Transfers: Sit to/from Stand Sit to Stand: Min assist Stand pivot transfers: Min assist       General transfer comment: stood for orthostatic testing and c/o feeling weak, BP 324 systolic standing 401 seated. pivot to chair with walker, cues for stepping and increased time, cues for hand placement and assist to place L hand on walker  Ambulation/Gait             General Gait Details: states unable due to feeling weak   Stairs             Wheelchair Mobility    Modified Rankin (Stroke Patients Only) Modified Rankin (Stroke Patients Only) Pre-Morbid Rankin Score: Moderate disability Modified Rankin: Moderately severe disability     Balance Overall balance  assessment: Needs assistance Sitting-balance support: Feet supported Sitting balance-Leahy Scale: Fair     Standing balance support: Single extremity supported Standing balance-Leahy Scale: Poor Standing balance comment: walker and min guard for static standing                            Cognition Arousal/Alertness: Awake/alert Behavior During Therapy: Flat affect Overall Cognitive Status: Within Functional Limits for tasks assessed                                        Exercises      General Comments        Pertinent Vitals/Pain Pain Assessment: No/denies pain    Home Living                      Prior Function            PT Goals (current goals can now be found in the care plan section) Progress towards PT goals: Progressing toward goals    Frequency    Min 4X/week      PT Plan Discharge plan needs to be updated    Co-evaluation              AM-PAC PT "6 Clicks" Mobility   Outcome Measure  Help needed turning from your back to your side while  in a flat bed without using bedrails?: A Little Help needed moving from lying on your back to sitting on the side of a flat bed without using bedrails?: A Lot Help needed moving to and from a bed to a chair (including a wheelchair)?: A Little Help needed standing up from a chair using your arms (e.g., wheelchair or bedside chair)?: A Little Help needed to walk in hospital room?: A Lot Help needed climbing 3-5 steps with a railing? : A Lot 6 Click Score: 15    End of Session Equipment Utilized During Treatment: Gait belt Activity Tolerance: Patient limited by fatigue Patient left: with call bell/phone within reach;in chair;with chair alarm set Nurse Communication: Mobility status PT Visit Diagnosis: Hemiplegia and hemiparesis;Other abnormalities of gait and mobility (R26.89) Hemiplegia - Right/Left: Left Hemiplegia - dominant/non-dominant: Non-dominant Hemiplegia -  caused by: Nontraumatic intracerebral hemorrhage     Time: 1155-1139 PT Time Calculation (min) (ACUTE ONLY): 1424 min  Charges:  $Therapeutic Activity: 23-37 mins                     Diamond Rice, [place] Acute Rehabilitation Services [phone] 09/02/2018    Diamond Rice 09/02/2018, 12:39 PM

## 2018-09-02 NOTE — Progress Notes (Signed)
Placed patient  CPAP via [facility], previous settings of 8.0 H20.  Tolerating well at this time.  RN aware.

## 2018-09-03 LAB — BASIC METABOLIC PANEL
ANION GAP: 8 (ref 5–15)
BUN: 13 mg/dL (ref 6–20)
CALCIUM: 8.9 mg/dL (ref 8.9–10.3)
CO2: 24 mmol/L (ref 22–32)
Chloride: 107 mmol/L (ref 98–111)
Creatinine, Ser: 1.21 mg/dL — ABNORMAL HIGH (ref 0.44–1.00)
GFR calc Af Amer: >60 mL/min (ref >60)
GFR, EST NON AFRICAN AMERICAN: 54 mL/min — AB (ref >60)
GLUCOSE: 126 mg/dL — AB (ref 70–99)
Potassium: 3.9 mmol/L (ref 3.5–5.1)
Sodium: 139 mmol/L (ref 135–145)

## 2018-09-03 LAB — GLUCOSE, CAPILLARY
Glucose-Capillary: 119 mg/dL — ABNORMAL HIGH (ref 70–99)
Glucose-Capillary: 195 mg/dL — ABNORMAL HIGH (ref 70–99)
Glucose-Capillary: 143 mg/dL — ABNORMAL HIGH (ref 70–99)
Glucose-Capillary: 147 mg/dL — ABNORMAL HIGH (ref 70–99)

## 2018-09-03 LAB — CBC
HCT: 30.5 % — ABNORMAL LOW (ref 36.0–46.0)
Hemoglobin: 9.3 g/dL — ABNORMAL LOW (ref 12.0–15.0)
MCH: 21.9 pg — ABNORMAL LOW (ref 26.0–34.0)
MCHC: 30.5 g/dL (ref 30.0–36.0)
MCV: 71.8 fL — ABNORMAL LOW (ref 80.0–100.0)
Platelets: 304 10*3/uL (ref 150–400)
RBC: 4.25 MIL/uL (ref 3.87–5.11)
RDW: 14.9 % (ref 11.5–15.5)
WBC: 7.4 10*3/uL (ref 4.0–10.5)
nRBC: 0 % (ref 0.0–0.2)

## 2018-09-03 NOTE — Progress Notes (Signed)
Physical Therapy Treatment Patient Details Name: Diamond Rice MRN: [id number] DOB: [birth year] Today's Date: 09/03/2018    History of Present Illness Pt adm with lt sided weakness and CT showed hemorrhagic infarct arising in the left posterior lentiform nucleus. PMH:  rt CVA, lt transmet amputation 05/28/18, Sleep apnea, COPD, smoker, DM, Bipolar, HTN, anxiety. Pt with recent SNF stay from Oct - Dec 31st after transmetatarsal amputation prior to this admission.     PT Comments    Pt performed functional mobility and gait training during session.  Pt continues to appear lethargic and flat during interactions.  She remains limited due to LLE/LUE strength and coordination.  Cueing to increase L stride length and hand over hand assistance on L side.  Pt continues to benefit from rehab in a post acute setting to improve strength and function before returning home to private residence.    Follow Up Recommendations  CIR     Equipment Recommendations  None recommended by PT    Recommendations for Other Services Rehab consult     Precautions / Restrictions Precautions Precautions: Fall Restrictions Weight Bearing Restrictions: No    Mobility  Bed Mobility Overal bed mobility: Needs Assistance Bed Mobility: Supine to Sit;Sit to Supine Rolling: Min assist Sidelying to sit: Mod assist;HOB elevated       General bed mobility comments: Pt continues to require moderate assistance to advance B LEs to edge of bed and elevate trunk into sitting from sidelying. Pt with poor balance sitting upright and required cues to scoot closer to the edge of bed to improve sitting balance with LE feedback.    Transfers Overall transfer level: Needs assistance Equipment used: Rolling walker (2 wheeled) Transfers: Sit to/from Stand Sit to Stand: Min assist;+2 physical assistance         General transfer comment: Cues for sequencing and hand placement.  Pt required assistance for LUE hand placement on  RW with hand over hand assistance.    Ambulation/Gait Ambulation/Gait assistance: Mod assist Gait Distance (Feet): 25 Feet Assistive device: Rolling walker (2 wheeled) Gait Pattern/deviations: Step-through pattern;Decreased stride length;Decreased step length - left;Trunk flexed Gait velocity: decr   General Gait Details: Cues for increasing stride on L side and hand over hand assistance for L hand placement and steering RW on the L side.     Stairs             Wheelchair Mobility    Modified Rankin (Stroke Patients Only) Modified Rankin (Stroke Patients Only) Pre-Morbid Rankin Score: Moderate disability Modified Rankin: Moderately severe disability     Balance Overall balance assessment: Needs assistance Sitting-balance support: No upper extremity supported;Feet supported Sitting balance-Leahy Scale: Fair       Standing balance-Leahy Scale: Poor                              Cognition Arousal/Alertness: Awake/alert Behavior During Therapy: Flat affect Overall Cognitive Status: Within Functional Limits for tasks assessed                                 General Comments: She continues to be appropriate but remains very quiet and continues to appear sleepy during interactions.        Exercises      General Comments        Pertinent Vitals/Pain Pain Assessment: No/denies pain    Home Living  Prior Function            PT Goals (current goals can now be found in the care plan section) Acute Rehab PT Goals Patient Stated Goal: to return to her PLOF Potential to Achieve Goals: Good Progress towards PT goals: Progressing toward goals    Frequency           PT Plan Current plan remains appropriate    Co-evaluation              AM-PAC PT "6 Clicks" Mobility   Outcome Measure  Help needed turning from your back to your side while in a flat bed without using bedrails?: A Little Help  needed moving from lying on your back to sitting on the side of a flat bed without using bedrails?: A Lot Help needed moving to and from a bed to a chair (including a wheelchair)?: A Little Help needed standing up from a chair using your arms (e.g., wheelchair or bedside chair)?: A Little Help needed to walk in hospital room?: A Lot Help needed climbing 3-5 steps with a railing? : A Lot 6 Click Score: 15    End of Session Equipment Utilized During Treatment: Gait belt Activity Tolerance: Patient limited by fatigue Patient left: with call bell/phone within reach;in chair;with chair alarm set Nurse Communication: Mobility status PT Visit Diagnosis: Hemiplegia and hemiparesis;Other abnormalities of gait and mobility (R26.89) Hemiplegia - Right/Left: Left Hemiplegia - dominant/non-dominant: Non-dominant Hemiplegia - caused by: Nontraumatic intracerebral hemorrhage     Time: 3546-5681 PT Time Calculation (min) (ACUTE ONLY): 15 min  Charges:  $Gait Training: 8-22 mins                     Diamond Rice, PTA Acute Rehabilitation Services Pager [phone] Office [phone]     Diamond Rice Diamond Rice 09/03/2018, 3:59 PM

## 2018-09-03 NOTE — Progress Notes (Addendum)
STROKE TEAM PROGRESS NOTE   SUBJECTIVE (INTERVAL HISTORY) No new complaints. Does not have caregiver support in order to go to CIR. Has used all MCR SNF days. SW working on MCD reinstatement (has had before, currently inactive). RN states pt trying to move to [place] and get a house near family who will provide care.   OBJECTIVE Vitals:   09/03/18 0415 09/03/18 0446 09/03/18 0840 09/03/18 1228  BP: 136/84  132/74 109/74  Pulse: 67  72 66  Resp: 20  17 16   Temp:  97.7 F (36.5 C) 97.8 F (36.6 C) 97.9 F (36.6 C)  TempSrc: Oral Oral Oral Oral  SpO2: 99%  99% 99%  Weight:      Height:        CBC:  Recent Labs  Lab 08/30/18 1312 08/30/18 1328 09/03/18 0620  WBC 8.5  --  7.4  NEUTROABS 3.9  --   --   HGB 10.2* 11.6* 9.3*  HCT 35.0* 34.0* 30.5*  MCV 74.2*  --  71.8*  PLT 357  --  124    Basic Metabolic Panel:  Recent Labs  Lab 08/30/18 1312 08/30/18 1328 09/03/18 0620  NA 141 143 139  K 3.6 3.6 3.9  CL 108 108 107  CO2 24  --  24  GLUCOSE 139* 135* 126*  BUN 9 13 13   CREATININE 1.24* 1.10* 1.21*  CALCIUM 9.5  --  8.9    PHYSICAL EXAM per Dr. Leonie Man Blood pressure 109/74, pulse 66, temperature 97.9 F (36.6 C), temperature source Oral, resp. rate 16, height 5\' 1"  (1.549 m), weight 88.5 kg, SpO2 99 %. Pleasant middle aged african american lady not in distress. . Afebrile. Head is nontraumatic. Neck is supple without bruit.    Cardiac exam no murmur or gallop. Lungs are clear to auscultation. Distal pulses are well felt. Neurological Exam ;  Awake alert oriented x 3 normal speech and language. Mild dysarthria. Mild left lower face asymmetry. Tongue midline. No drift. Mild diminished fine finger movements on left. Orbits right over left upper extremity. Mild left grip weak. Decrease sensation on the left face, and arm Normal coordination.    ASSESSMENT/PLAN [name] is a 48 y.o. female with history of PE (10/19 on xarelto), CVA ( 2016; residual left  side weakness), HTN, ASPVD, DM HLD, COPD  presenting with left arm numbness and facial droop. She did not receive IV t-PA due to [place].  Stroke: right posterior BG/PLIC hemorrhage vs hemorrhagic infarct  CT head 1/30 1330 - hemorrhagic infarct arising in the right  posterior lentiform nucleus - multiple old infarcts  CT head 1/30 2024 - no change in hematoma/hemorrhagic infarct posterior right basal ganglia/lateral thalamus/posterior limb internal capsule.  Small vessel disease  MRI/MRA head and neck pending   Bilateral lower extremity venous dopplers - negative for DVT  2D Echo - 06/24/18  - EF 65 - 70%. No cardiac source of emboli identified.   LDL  104  HgbA1c 7.0  UDS still not drawn. It's yield is now likely unfruitful  VTE prophylaxis - SCDs  clopidogrel 75 mg daily, Xarelto (rivaroxaban) daily but recently ran out of medications prior to admission, now on No antithrombotic. Anticipate add aspirin if remains stable after 1 week.  Therapy recommendations:  CIR (no caregiver support) SNF (no MCR days left).   Disposition:  pending - SW working on reactivating MCD. Pt looking to family to assist  Pulmonary embolus  Diagnosed 05/2018 - provoked event as  she had undergone a left 3-5 digit toe amputation on 10/1 with only physical activity being PT, otherwise she does not walk.  Planned tx x 3 months with AC then change to aspirin and plavix  On xarleto, but ran out and did not refill dt lack of money  Hypertension  Initial blood pressure goal less than 140   Blood pressure goal now less than 160   Home medications: Catapres 0.3 3 times daily, hydralazine 50 mg 4 times daily, lisinopril 30 mg a day, metoprolol 25 mg a day  Home meds all renewed in hospital  BP now low 87/68 - 133/82 . Long-term BP goal normotensive  Hyperlipidemia, LDL 104, on no statin PTA, now on no statin given hemorrhage  Diabetes  HgbA1c 7.0, goal < 7.0 - much improved. 2 yrs previous  12.3  Under management. Appears controlled   Other Stroke Risk Factors  Former cigarette smoker - quit  Substance abuse - crack cocaine. UDS pending   Obesity, Body mass index is 36.87 kg/m., recommend weight loss, diet and exercise as appropriate   Hx stroke/TIA  09/6946 small R PLIC infarct d/t SVD. Started on aspirin 325. D/c to CIR. HTN. HLD, DB, THC use, ETOH use, morbid obesity, OSA - noncompliant with medications  Family hx stroke (father - brothers)  Migraines  Obstructive sleep apnea, on CPAP at home- compliant - using as IP  LVH - new in Oct.  Secondary to hypertensive heart disease.  No further treatment per cardiology 06/2018   Sarcoidosis  Aortic insufficiency with plans for repeat echo in 1 year per cardiology 06/2018  Other Active Problems  Microcytic anemia  Creatinine - 1.10/ 08/30/18  Substance abuse history  Hx Left foot lesser toe amputation using Silvadene cream.  WOC RN consulted.  Continue current plan of care.  Follow by outpatient physician as PTA  Hx GIB, history of hematochezia August 2019 with colonoscopy revealing several ulcers and polyps.  Refer to GI 05/2018  Schizoaffective disorder, bipolar type  Neuropathy  Chronic constipation  Contact precautions for MRSA  Burnetta Sabin, MSN, APRN, ANVP-BC, AGPCNP-BC Advanced Practice Stroke Nurse [place] for Schedule & Pager information 09/03/2018 3:25 PM   Hospital day # 4   I have personally obtained history,examined this patient, reviewed notes, independently viewed imaging studies, participated in medical decision making and plan of care.ROS completed by me personally and pertinent positives fully documented  I have made any additions or clarifications directly to the above note. Agree with note above.   Antony Contras, MD Medical Director [facility] Pager: [phone] 09/03/2018 4:44 PM   To contact Stroke Continuity provider, please refer to  [email or web address] After hours, contact General Neurology

## 2018-09-03 NOTE — Progress Notes (Signed)
Placed patient on CPAP via FFM, previous settings of 8.0 cm H20. Tolerating well at this time. RN aware.

## 2018-09-03 NOTE — Progress Notes (Signed)
Inpatient Rehabilitation Admissions Coordinator  I met with patient at bedside to discuss her care needs and rehab options. She has been at [facility] since 05/2018 and was discharged home on 08/27/2018 to stay with her Godmother. Did not have co pays for her meds and was without meds for 3 days. She does not have caregiver support for her Godmother is 47 years old and unable to assist She prefers a rehab facility in [place], [place] where her brother works. It is [facility] in [place], [place]. I have updated SW and will sign off at this time. Her Medicaid is currently inactive as of today. I have notified SW.  Danne Baxter, RN, MSN Rehab Admissions Coordinator [phone] 09/03/2018 11:56 AM

## 2018-09-04 LAB — GLUCOSE, CAPILLARY
GLUCOSE-CAPILLARY: 158 mg/dL — AB (ref 70–99)
GLUCOSE-CAPILLARY: 174 mg/dL — AB (ref 70–99)
Glucose-Capillary: 132 mg/dL — ABNORMAL HIGH (ref 70–99)
Glucose-Capillary: 147 mg/dL — ABNORMAL HIGH (ref 70–99)

## 2018-09-04 NOTE — Progress Notes (Addendum)
STROKE TEAM PROGRESS NOTE   SUBJECTIVE (INTERVAL HISTORY) up in chair. Mild improvement in HP. SW working on SNF placement, MCD. Pt stable.  OBJECTIVE Vitals:   09/03/18 1954 09/03/18 2355 09/04/18 0346 09/04/18 0734  BP: 121/79 113/72 112/78 118/71  Pulse: 70 69 62 65  Resp: 20 20 18 18   Temp: 98.2 F (36.8 C) 98.2 F (36.8 C) 97.6 F (36.4 C) 98.3 F (36.8 C)  TempSrc: Oral Oral Oral   SpO2: 99% 99% 100% 100%  Weight:      Height:        CBC:  Recent Labs  Lab 08/30/18 1312 08/30/18 1328 09/03/18 0620  WBC 8.5  --  7.4  NEUTROABS 3.9  --   --   HGB 10.2* 11.6* 9.3*  HCT 35.0* 34.0* 30.5*  MCV 74.2*  --  71.8*  PLT 357  --  825    Basic Metabolic Panel:  Recent Labs  Lab 08/30/18 1312 08/30/18 1328 09/03/18 0620  NA 141 143 139  K 3.6 3.6 3.9  CL 108 108 107  CO2 24  --  24  GLUCOSE 139* 135* 126*  BUN 9 13 13   CREATININE 1.24* 1.10* 1.21*  CALCIUM 9.5  --  8.9    PHYSICAL EXAM per Dr. Leonie Rice, no change in condition Blood pressure 118/71, pulse 65, temperature 98.3 F (36.8 C), resp. rate 18, height 5\' 1"  (1.549 m), weight 88.5 kg, SpO2 100 %. Pleasant middle aged african american lady not in distress. . Afebrile. Head is nontraumatic. Neck is supple without bruit.    Cardiac exam no murmur or gallop. Lungs are clear to auscultation. Distal pulses are well felt. Neurological Exam ;  Awake alert oriented x 3 normal speech and language. Mild dysarthria. Mild left lower face asymmetry. Tongue midline. No drift. Mild diminished fine finger movements on left. Orbits right over left upper extremity. Mild left grip weak. Decrease sensation on the left face, and arm Normal coordination.    ASSESSMENT/PLAN [name] is a 48 y.o. female with history of PE (10/19 on xarelto), CVA ( 2016; residual left side weakness), HTN, ASPVD, DM HLD, COPD  presenting with left arm numbness and facial droop. She did not receive IV t-PA due to Diamond Rice.  Stroke: right  posterior BG/PLIC hemorrhage   CT head 1/30 1330 - hemorrhagic infarct arising in the right  posterior lentiform nucleus - multiple old infarcts  CT head 1/30 2024 - no change in hematoma/hemorrhagic infarct posterior right basal ganglia/lateral thalamus/posterior limb internal capsule.  Small vessel disease  MRI/MRA head and neck unchanged R BG hmg. No ELVO. Normal neck. 10-20 scattered microhemorrhages. Chronic Small vessel disease.   Bilateral lower extremity venous dopplers - negative for DVT  2D Echo - 06/24/18  - EF 65 - 70%. No cardiac source of emboli identified.   LDL  104  HgbA1c 7.0  UDS still not drawn. It's yield is now likely unfruitful  VTE prophylaxis - SCDs   clopidogrel 75 mg daily, Xarelto (rivaroxaban) daily but recently ran out of medications prior to admission, now on No antithrombotic.  No longer an AC candidate. Per Dr. Leonie Rice, add aspirin 81 mg daily today.   Therapy recommendations:  CIR (no caregiver support) SNF (no MCR days left).   Disposition:  pending - SW working on reactivating MCD. Pt looking to family to assist  Pulmonary embolus  Diagnosed 05/2018 - provoked event as she had undergone a left 3-5 digit toe amputation on 10/1  with only physical activity being PT, otherwise she does not walk.  Planned tx x 3 months with AC then change to aspirin and plavix  On xarleto, but ran out and did not refill dt lack of money  No longer an [facility] candidate  Hypertension  Initial blood pressure goal less than 140   Blood pressure goal now less than 160   Home medications: Catapres 0.3 3 times daily, hydralazine 50 mg 4 times daily, lisinopril 30 mg a day, metoprolol 25 mg a day  Home meds all renewed in hospital  BP stable . Long-term BP goal normotensive  Hyperlipidemia, LDL 104, on no statin PTA, now on no statin given hemorrhage  Diabetes  HgbA1c 7.0, goal < 7.0 - much improved. 2 yrs previous 12.3  Under management. Appears controlled    Other Stroke Risk Factors  Former cigarette smoker - quit  Substance abuse - crack cocaine. UDS pending   Obesity, Body mass index is 36.87 kg/m., recommend weight loss, diet and exercise as appropriate   Hx stroke/TIA  11/812 small R PLIC infarct d/t SVD. Started on aspirin 325. D/c to CIR. HTN. HLD, DB, THC use, ETOH use, morbid obesity, OSA - noncompliant with medications  Family hx stroke (father - brothers)  Migraines  Obstructive sleep apnea, on CPAP at home- compliant - using as IP  LVH - new in Oct.  Secondary to hypertensive heart disease.  No further treatment per cardiology 06/2018   Sarcoidosis  Aortic insufficiency with plans for repeat echo in 1 year per cardiology 06/2018  Other Active Problems  Microcytic anemia 9.3  Creatinine - 1.21  Substance abuse history  Hx Left foot lesser toe amputation using Silvadene cream.  WOC RN consulted.  Continue current plan of care.  Follow by outpatient physician as PTA  Hx GIB, history of hematochezia August 2019 with colonoscopy revealing several ulcers and polyps.  Refer to GI 05/2018  Schizoaffective disorder, bipolar type  Neuropathy  Chronic constipation  Contact precautions for MRSA  Diamond Sabin, MSN, APRN, ANVP-BC, AGPCNP-BC Advanced Practice Stroke Nurse [place] for Schedule & Pager information 09/04/2018 11:33 AM   Hospital day # 5  I have personally obtained history,examined this patient, reviewed notes, independently viewed imaging studies, participated in medical decision making and plan of care.ROS completed by me personally and pertinent positives fully documented  I have made any additions or clarifications directly to the above note. Agree with note above.    Diamond Contras, MD Medical Director [facility] Stroke Center Pager: [phone] 09/04/2018 4:21 PM    To contact Stroke Continuity provider, please refer to [email or web address] After hours, contact General  Neurology

## 2018-09-04 NOTE — Progress Notes (Signed)
Physical Therapy Treatment Patient Details Name: Diamond Rice MRN: [id number] DOB: 1970/12/07 Today's Date: 09/04/2018    History of Present Illness Pt adm with lt sided weakness and CT showed hemorrhagic infarct arising in the left posterior lentiform nucleus. PMH:  rt CVA, lt transmet amputation 05/28/18, Sleep apnea, COPD, smoker, DM, Bipolar, HTN, anxiety. Pt with recent SNF stay from Oct - Dec 31st after transmetatarsal amputation prior to this admission.     PT Comments    Pt performed gait training and functional mobility.  She is able to keep her L hand on RW with wrist extended with good support.  Pt continues to benefit from aggressive CIR therapies to return to baseline before return home.  Pt is aware of deficits and realizes she cannot manage at home in her current state.  She is agreeable to rehab in a post acute setting.  Plan for progression of gait and higher level balance activities next session.   Follow Up Recommendations  CIR     Equipment Recommendations  None recommended by PT    Recommendations for Other Services Rehab consult     Precautions / Restrictions Precautions Precautions: Fall Restrictions Weight Bearing Restrictions: No    Mobility  Bed Mobility Overal bed mobility: Needs Assistance Bed Mobility: Rolling;Sidelying to Sit Rolling: Mod assist Sidelying to sit: Mod assist;HOB elevated       General bed mobility comments: Pt continues to require moderate assistance to advance B LEs to edge of bed and elevate trunk into sitting from sidelying. Pt with poor balance sitting upright and required cues to scoot closer to the edge of bed to improve sitting balance with LE feedback.    Transfers Overall transfer level: Needs assistance Equipment used: Rolling walker (2 wheeled) Transfers: Sit to/from Stand Sit to Stand: Min guard         General transfer comment: Cues for hand placement, decreased time to place L hand on RW hand grip.     Ambulation/Gait Ambulation/Gait assistance: Mod assist Gait Distance (Feet): 50 Feet Assistive device: Rolling walker (2 wheeled) Gait Pattern/deviations: Step-through pattern;Decreased stride length;Decreased step length - left;Trunk flexed Gait velocity: decr   General Gait Details: Pt able to keep L hand on RW and increasing stride with decrease cues.  Pt continues to fatigue quickly with activity.     Stairs             Wheelchair Mobility    Modified Rankin (Stroke Patients Only) Modified Rankin (Stroke Patients Only) Pre-Morbid Rankin Score: Moderate disability Modified Rankin: Moderately severe disability     Balance     Sitting balance-Leahy Scale: Fair       Standing balance-Leahy Scale: Poor                              Cognition Arousal/Alertness: Awake/alert Behavior During Therapy: Flat affect Overall Cognitive Status: Within Functional Limits for tasks assessed                                 General Comments: Pt more alert but continues with flat affect      Exercises      General Comments        Pertinent Vitals/Pain Pain Assessment: 0-10 Pain Score: 6  Pain Location: R leg  Pain Descriptors / Indicators: Constant;Discomfort;Grimacing Pain Intervention(s): Monitored during session;Repositioned    Home Living  Prior Function            PT Goals (current goals can now be found in the care plan section) Acute Rehab PT Goals Patient Stated Goal: to return to her PLOF Potential to Achieve Goals: Good Progress towards PT goals: Progressing toward goals    Frequency    Min 4X/week      PT Plan Current plan remains appropriate    Co-evaluation              AM-PAC PT "6 Clicks" Mobility   Outcome Measure  Help needed turning from your back to your side while in a flat bed without using bedrails?: A Little Help needed moving from lying on your back to sitting  on the side of a flat bed without using bedrails?: A Lot Help needed moving to and from a bed to a chair (including a wheelchair)?: A Little Help needed standing up from a chair using your arms (e.g., wheelchair or bedside chair)?: A Little Help needed to walk in hospital room?: A Little Help needed climbing 3-5 steps with a railing? : A Lot 6 Click Score: 16    End of Session Equipment Utilized During Treatment: Gait belt Activity Tolerance: Patient limited by fatigue Patient left: with call bell/phone within reach;in chair;with chair alarm set Nurse Communication: Mobility status PT Visit Diagnosis: Hemiplegia and hemiparesis;Other abnormalities of gait and mobility (R26.89) Hemiplegia - Right/Left: Left Hemiplegia - dominant/non-dominant: Non-dominant Hemiplegia - caused by: Nontraumatic intracerebral hemorrhage     Time: 9166-0600 PT Time Calculation (min) (ACUTE ONLY): 17 min  Charges:  $Gait Training: 8-22 mins                     Governor Rooks, PTA Acute Rehabilitation Services Pager [phone] Office [phone]     Ryatt Corsino Eli Hose 09/04/2018, 6:06 PM

## 2018-09-04 NOTE — Progress Notes (Addendum)
Occupational Therapy Treatment Patient Details Name: Diamond Rice MRN: [id number] DOB: Nov 19, 1970 Today's Date: 09/04/2018    History of present illness Pt adm with lt sided weakness and CT showed hemorrhagic infarct arising in the left posterior lentiform nucleus. PMH:  rt CVA, lt transmet amputation 05/28/18, Sleep apnea, COPD, smoker, DM, Bipolar, HTN, anxiety. Pt with recent SNF stay from Oct - Dec 31st after transmetatarsal amputation prior to this admission.    OT comments  Pt progressing towards established OT goals. Pt continues to present with sleepiness and requiring increased time throughout session. Pt requiring Mod A for donning socks while seated at EOB. Pt performing grooming at sink (both sitting and standing) and required Min Guard-Min A for balance, safety, and facilitating normalized movements patterned at her LUE. Pt continues to present with decreased functional use of LUE and poor grasp strength. Pt is highly motivated to participate in therapy despite sleepiness and fatigue. Continue to recommend dc to CIR for intensive OT and will continue to follow acutely as admitted.    Follow Up Recommendations  CIR;Supervision/Assistance - 24 hour    Equipment Recommendations  3 in 1 bedside commode;Other (comment)(TBD in next venue)    Recommendations for Other Services      Precautions / Restrictions Precautions Precautions: Fall Restrictions Weight Bearing Restrictions: No       Mobility Bed Mobility Overal bed mobility: Needs Assistance Bed Mobility: Supine to Sit;Sit to Supine Rolling: Min assist Sidelying to sit: Mod assist;HOB elevated Supine to sit: HOB elevated;Mod assist Sit to supine: Mod assist   General bed mobility comments: Pt continues to require moderate assistance to advance B LEs to edge of bed and elevate trunk into sitting from sidelying. Pt with poor balance sitting upright and required cues to scoot closer to the edge of bed to improve sitting  balance with LE feedback.    Transfers Overall transfer level: Needs assistance Equipment used: Rolling walker (2 wheeled) Transfers: Sit to/from Stand Sit to Stand: Min guard         General transfer comment: Min GUard A for safety and signficiant time    Balance Overall balance assessment: Needs assistance Sitting-balance support: No upper extremity supported;Feet supported Sitting balance-Leahy Scale: Fair     Standing balance support: Bilateral upper extremity supported Standing balance-Leahy Scale: Poor Standing balance comment: walker and min guard for static standing                           ADL either performed or assessed with clinical judgement   ADL Overall ADL's : Needs assistance/impaired     Grooming: Wash/dry face;Sitting;Oral care;Min guard;Standing;Minimal assistance Grooming Details (indicate cue type and reason): Min Guard A for safetying balance. Hand over hand for facilitating normalized movement patterns at LUE and to weight bear through LUE.  Pt presenting with decreased grasp and ROM at LUE. Cues for incorporation into tasks. Pt reporting pain in her right leg during standing and reports she cannot maintain standing and required to sit. Pt alternating between sitting and standing during oral care. Challenging both sitting and standing balance while changing positions for reaching             Lower Body Dressing: Moderate assistance;Sit to/from stand Lower Body Dressing Details (indicate cue type and reason): Pt requiring Mod A to don left sock while seated at EOB. Requiring physical A for safe when bending forward as well as to incorporate LUE into task. Facilitating grasp  of left hand to don socks over toes. Pt then able to pull over heel. Significant time required Toilet Transfer: Minimal assistance;Ambulation;RW(simulated to recliner)           Functional mobility during ADLs: Minimal assistance;Rolling walker General ADL Comments:  Pt continues to present with lethargy and LUE weakness     Vision   Vision Assessment?: Yes Eye Alignment: Within Functional Limits Ocular Range of Motion: Within Functional Limits Alignment/Gaze Preference: Within Defined Limits Tracking/Visual Pursuits: Able to track stimulus in all quads without difficulty   Perception     Praxis      Cognition Arousal/Alertness: Awake/alert Behavior During Therapy: Flat affect Overall Cognitive Status: Within Functional Limits for tasks assessed                                 General Comments: Pt following commands appropriately. However, contineus to present with lethargic behavior and requiring significant time for processing and sequencing. Pt very sleepy and closer her eyes.        Exercises Exercises: General Upper Extremity General Exercises - Upper Extremity Composite Extension: AROM;AAROM;Left;10 reps;Seated(Max cues)   Shoulder Instructions       General Comments      Pertinent Vitals/ Pain       Pain Assessment: Faces Faces Pain Scale: Hurts even more Pain Location: Right leg Pain Descriptors / Indicators: Constant;Discomfort;Grimacing Pain Intervention(s): Limited activity within patient's tolerance;Monitored during session;Repositioned  Home Living                                          Prior Functioning/Environment              Frequency  Min 2X/week        Progress Toward Goals  OT Goals(current goals can now be found in the care plan section)  Progress towards OT goals: Progressing toward goals  Acute Rehab OT Goals Patient Stated Goal: to return to her PLOF OT Goal Formulation: With patient Time For Goal Achievement: 09/16/18 Potential to Achieve Goals: Good ADL Goals Pt Will Perform Grooming: standing;with supervision Pt Will Perform Upper Body Dressing: with modified independence;sitting Pt Will Perform Lower Body Dressing: with supervision;sit to/from  stand Pt Will Transfer to Toilet: with supervision;ambulating Pt Will Perform Toileting - Clothing Manipulation and hygiene: with supervision;sit to/from stand Pt/caregiver will Perform Home Exercise Program: Increased ROM;Increased strength;Left upper extremity;With Supervision Additional ADL Goal #1: Pt will perform bed mobility at supervision level as precursor to EOB/OOB ADL.  Plan Discharge plan remains appropriate    Co-evaluation                 AM-PAC OT "6 Clicks" Daily Activity     Outcome Measure   Help from another person eating meals?: A Little Help from another person taking care of personal grooming?: A Little Help from another person toileting, which includes using toliet, bedpan, or urinal?: A Lot Help from another person bathing (including washing, rinsing, drying)?: A Lot Help from another person to put on and taking off regular upper body clothing?: A Little Help from another person to put on and taking off regular lower body clothing?: A Lot 6 Click Score: 15    End of Session Equipment Utilized During Treatment: Rolling walker  OT Visit Diagnosis: Muscle weakness (generalized) (M62.81);Hemiplegia and hemiparesis Hemiplegia -  Right/Left: Left Hemiplegia - dominant/non-dominant: Non-Dominant   Activity Tolerance Patient tolerated treatment well   Patient Left in bed;with call bell/phone within reach;with bed alarm set   Nurse Communication Mobility status        Time: 1000-1031 OT Time Calculation (min): 31 min  Charges: OT General Charges $OT Visit: 1 Visit OT Treatments $Self Care/Home Management : 23-37 mins  [place], OTR/L Acute Rehab Pager: [phone] Office: [place] 09/04/2018, 11:21 AM

## 2018-09-05 LAB — GLUCOSE, CAPILLARY
Glucose-Capillary: 150 mg/dL — ABNORMAL HIGH (ref 70–99)
Glucose-Capillary: 266 mg/dL — ABNORMAL HIGH (ref 70–99)
Glucose-Capillary: 118 mg/dL — ABNORMAL HIGH (ref 70–99)
Glucose-Capillary: 208 mg/dL — ABNORMAL HIGH (ref 70–99)

## 2018-09-05 NOTE — Progress Notes (Signed)
Physical Therapy Treatment Patient Details Name: Diamond Rice MRN: [id number] DOB: 01-01-1971 Today's Date: 09/05/2018    History of Present Illness Pt adm with lt sided weakness and CT showed hemorrhagic infarct arising in the left posterior lentiform nucleus. PMH:  rt CVA, lt transmet amputation 05/28/18, Sleep apnea, COPD, smoker, DM, Bipolar, HTN, anxiety. Pt with recent SNF stay from Oct - Dec 31st after transmetatarsal amputation prior to this admission.     PT Comments    Showing improvement with gait stability.  In general needing min assist OOB, but still need extra help to get up to EOB from supine.  Pt might benefit nicely from a short and intense rehab stay.    Follow Up Recommendations  CIR     Equipment Recommendations  None recommended by PT    Recommendations for Other Services       Precautions / Restrictions Precautions Precautions: Fall    Mobility  Bed Mobility Overal bed mobility: Needs Assistance Bed Mobility: Rolling;Sidelying to Sit;Sit to Supine Rolling: Min guard(with rail) Sidelying to sit: Mod assist;HOB elevated   Sit to supine: Mod assist   General bed mobility comments: slow with some struggle and need for light moderate assist to come up.  Transfers Overall transfer level: Needs assistance Equipment used: Rolling walker (2 wheeled) Transfers: Sit to/from Stand Sit to Stand: Min guard         General transfer comment: cues for hand placement; guard assist only  Ambulation/Gait Ambulation/Gait assistance: Min assist Gait Distance (Feet): 80 Feet Assistive device: Rolling walker (2 wheeled) Gait Pattern/deviations: Step-through pattern Gait velocity: decr Gait velocity interpretation: <1.31 ft/sec, indicative of household ambulator General Gait Details: slow and guarded, but generally steady with good gait pattern given amputation.   Stairs             Wheelchair Mobility    Modified Rankin (Stroke Patients  Only) Modified Rankin (Stroke Patients Only) Modified Rankin: Moderately severe disability     Balance Overall balance assessment: Needs assistance   Sitting balance-Leahy Scale: Fair     Standing balance support: Bilateral upper extremity supported Standing balance-Leahy Scale: Poor Standing balance comment: improving standing balance, but still reliant on AD                            Cognition Arousal/Alertness: Awake/alert Behavior During Therapy: Flat affect Overall Cognitive Status: Within Functional Limits for tasks assessed                                        Exercises      General Comments        Pertinent Vitals/Pain Pain Assessment: Faces Faces Pain Scale: Hurts a little bit Pain Location: R foot, Headache Pain Descriptors / Indicators: Aching;Discomfort Pain Intervention(s): Monitored during session    Home Living                      Prior Function            PT Goals (current goals can now be found in the care plan section) Acute Rehab PT Goals Patient Stated Goal: to return to her PLOF PT Goal Formulation: With patient Time For Goal Achievement: 09/14/18 Potential to Achieve Goals: Good Progress towards PT goals: Progressing toward goals    Frequency    Min 4X/week  PT Plan Current plan remains appropriate    Co-evaluation              AM-PAC PT "6 Clicks" Mobility   Outcome Measure  Help needed turning from your back to your side while in a flat bed without using bedrails?: A Little Help needed moving from lying on your back to sitting on the side of a flat bed without using bedrails?: A Lot Help needed moving to and from a bed to a chair (including a wheelchair)?: A Little Help needed standing up from a chair using your arms (e.g., wheelchair or bedside chair)?: A Little Help needed to walk in hospital room?: A Little Help needed climbing 3-5 steps with a railing? : A Lot 6 Click  Score: 16    End of Session   Activity Tolerance: Patient tolerated treatment well Patient left: in bed;with call bell/phone within reach Nurse Communication: Mobility status PT Visit Diagnosis: Hemiplegia and hemiparesis;Other abnormalities of gait and mobility (R26.89) Hemiplegia - Right/Left: Left Hemiplegia - dominant/non-dominant: Non-dominant Hemiplegia - caused by: Nontraumatic intracerebral hemorrhage     Time: 5056-9794 PT Time Calculation (min) (ACUTE ONLY): 33 min  Charges:  $Gait Training: 8-22 mins $Therapeutic Activity: 8-22 mins                     09/05/2018  Donnella Sham, PT Acute Rehabilitation Services [phone]  (pager) [phone]  (office)   Diamond Rice 09/05/2018, 6:13 PM

## 2018-09-05 NOTE — Progress Notes (Signed)
Nutrition Follow-up  DOCUMENTATION CODES:   Obesity unspecified  INTERVENTION:  Continue Glucerna Shake po BID, each supplement provides 220 kcal and 10 grams of protein.  Encourage adequate PO intake.   NUTRITION DIAGNOSIS:   Inadequate oral intake related to poor appetite as evidenced by meal completion < 50%; ongoing  GOAL:   Patient will meet greater than or equal to 90% of their needs; progressing  MONITOR:   PO intake, Supplement acceptance, Weight trends, I & O's, Skin, Labs  REASON FOR ASSESSMENT:   Malnutrition Screening Tool    ASSESSMENT:   48 y.o. female  With PMH of PE ( 10/19 on xarelto), CVA ( 2016; residual left side weakness), HTN, DM HLD, COPD who presented to [facility] ED with c/o left arm numbness and facial droop.  Meal completion has been 50-75%. Pt currently has Glucerna shake ordered and has been consuming them. RD to continue with current orders to aid in caloric and protein needs. Pt encouraged to eat her food at meals and to drink her supplements.   Labs and medications reviewed.  Diet Order:   Diet Order            Diet heart healthy/carb modified Room service appropriate? Yes; Fluid consistency: Thin  Diet effective now              EDUCATION NEEDS:   Not appropriate for education at this time  Skin:  Skin Assessment: Reviewed RN Assessment  Last BM:  1/7  Height:   Ht Readings from Last 1 Encounters:  09/01/18 5\' 1"  (1.549 m)    Weight:   Wt Readings from Last 1 Encounters:  09/01/18 88.5 kg    Ideal Body Weight:  47.7 kg  BMI:  Body mass index is 36.87 kg/m.  Estimated Nutritional Needs:   Kcal:  1700-1900  Protein:  85-100 grams  Fluid:  1.7 - 1.9 L/day    Corrin Parker, MS, RD, LDN Pager # [phone] After hours/ weekend pager # [phone]

## 2018-09-05 NOTE — Progress Notes (Signed)
  Speech Language Pathology Treatment: Cognitive-Linquistic  Patient Details Name: Diamond Rice MRN: [id number] DOB: [birth year] Today's Date: 09/05/2018 Time: 1410-1430 SLP Time Calculation (min) (ACUTE ONLY): 20 min  Assessment / Plan / Recommendation Clinical Impression  Pt seen at bedside for continued diagnostic treatment. Initial evaluation was limited due to pt mentation. Pt was sleeping on arrival of SLP, but roused easily and verbalized willingness to participate in therapy. The [place] Mental Status (SLUMS) Examination was administered. Pt scored 18/30, raising concern for neurocognitive disorder. Pt has a history of prior CVA, and no family is present at this time to provide information on baseline level of function. Pt reports she lives with her Godmother, and has a high school education. She is on disability.    Continued ST intervention is recommended at next level of care. ST will continue to follow acutely, targeting established goals.   HPI HPI: Pt adm with lt sided weakness and CT showed hemorrhagic infarct arising in the left posterior lentiform nucleus. PMH:  rt CVA, lt transmet amputation 05/28/18, Sleep apnea, COPD, smoker, DM, Bipolar, HTN, anxiety.      SLP Plan  Continue with current plan of care       Recommendations   Continued ST intervention at next venue                Follow up Recommendations: Skilled Nursing facility SLP Visit Diagnosis: Cognitive communication deficit (R41.841) Plan: Continue with current plan of care       [place] [facility], [place] Speech Language Pathologist [phone]  Shonna Chock 09/05/2018, 2:35 PM

## 2018-09-05 NOTE — Progress Notes (Addendum)
STROKE TEAM PROGRESS NOTE   SUBJECTIVE (INTERVAL HISTORY) Patient in chair, awake, alert, NAD.Plan is for SNF; working with case management as patient has no more SNF days left for medicare. Patient wondering how long it will take for her to go to SNF. OBJECTIVE Vitals:   09/05/18 0046 09/05/18 0357 09/05/18 0827 09/05/18 1137  BP:  122/79 134/83 (!) 135/93  Pulse: 78 88 63 70  Resp: 18 20 17 17   Temp:  98 F (36.7 C) 98.2 F (36.8 C) 97.8 F (36.6 C)  TempSrc:  Oral Oral Oral  SpO2:  99% 99% 100%  Weight:      Height:        CBC:  Recent Labs  Lab 08/30/18 1312 08/30/18 1328 09/03/18 0620  WBC 8.5  --  7.4  NEUTROABS 3.9  --   --   HGB 10.2* 11.6* 9.3*  HCT 35.0* 34.0* 30.5*  MCV 74.2*  --  71.8*  PLT 357  --  426    Basic Metabolic Panel:  Recent Labs  Lab 08/30/18 1312 08/30/18 1328 09/03/18 0620  NA 141 143 139  K 3.6 3.6 3.9  CL 108 108 107  CO2 24  --  24  GLUCOSE 139* 135* 126*  BUN 9 13 13   CREATININE 1.24* 1.10* 1.21*  CALCIUM 9.5  --  8.9     Blood pressure (!) 135/93, pulse 70, temperature 97.8 F (36.6 C), temperature source Oral, resp. rate 17, height 5\' 1"  (1.549 m), weight 88.5 kg, SpO2 100 %.  PHYSICAL EXAM   Blood pressure 118/71, pulse 65, temperature 98.3 F (36.8 C), resp. rate 18, height 5\' 1"  (1.549 m), weight 88.5 kg, SpO2 100 %. Pleasant middle aged african american lady not in distress. . Afebrile. Head is nontraumatic. Neck is supple without bruit.    Cardiac exam no murmur or gallop. Lungs are clear to auscultation. Distal pulses are well felt. Neurological Exam ;  Awake alert oriented x 3 normal speech and language. Mild dysarthria. Mild left lower face asymmetry. Tongue midline. No drift. Mild diminished fine finger movements on left. Orbits right over left upper extremity. Mild left grip weak. Decrease sensation on the left face, and arm Normal coordination.  ;     ASSESSMENT/PLAN Diamond Rice is a 48 y.o. female  with history of PE (10/19 on xarelto), CVA ( 2016; residual left side weakness), HTN, ASPVD, DM HLD, COPD  presenting with left arm numbness and facial droop. She did not receive IV t-PA due to [place].  Stroke: right posterior BG/PLIC hemorrhage   CT head 1/30 1330 - hemorrhagic infarct arising in the right  posterior lentiform nucleus - multiple old infarcts  CT head 1/30 2024 - no change in hematoma/hemorrhagic infarct posterior right basal ganglia/lateral thalamus/posterior limb internal capsule.  Small vessel disease  MRI/MRA head and neck unchanged R BG hmg. No ELVO. Normal neck. 10-20 scattered microhemorrhages. Chronic Small vessel disease.   Bilateral lower extremity venous dopplers - negative for DVT  2D Echo - 06/24/18  - EF 65 - 70%. No cardiac source of emboli identified.   LDL  104  HgbA1c 7.0  UDS still not drawn. It's yield is now likely unfruitful  VTE prophylaxis - SCDs   clopidogrel 75 mg daily, Xarelto (rivaroxaban) daily but recently ran out of medications prior to admission, now on No antithrombotic.  No longer an AC candidate. Per Dr. Leonie Man, add aspirin 81 mg daily today.   Therapy recommendations:  CIR (  no caregiver support) SNF (no MCR days left).   Disposition:  pending - SW working on reactivating MCD. Pt looking to family to assist  Pulmonary embolus  Diagnosed 05/2018 - provoked event as she had undergone a left 3-5 digit toe amputation on 10/1 with only physical activity being PT, otherwise she does not walk.  Planned tx x 3 months with AC then change to aspirin and plavix  On xarleto, but ran out and did not refill dt lack of money  No longer an [facility] candidate  Hypertension  Initial blood pressure goal less than 140   Blood pressure goal now less than 160   Home medications: Catapres 0.3 3 times daily, hydralazine 50 mg 4 times daily, lisinopril 30 mg a day, metoprolol 25 mg a day  Home meds all renewed in hospital  BP stable . Long-term BP  goal normotensive  Hyperlipidemia, LDL 104, on no statin PTA, now on no statin given hemorrhage  Diabetes  HgbA1c 7.0, goal < 7.0 - much improved. 2 yrs previous 12.3  Under management. Appears controlled   Other Stroke Risk Factors  Former cigarette smoker - quit  Substance abuse - crack cocaine. UDS   Obesity, Body mass index is 36.87 kg/m., recommend weight loss, diet and exercise as appropriate   Hx stroke/TIA  0/3559 small R PLIC infarct d/t SVD. Started on aspirin 325. D/c to CIR. HTN. HLD, DB, THC use, ETOH use, morbid obesity, OSA - noncompliant with medications  Family hx stroke (father - brothers)  Migraines  Obstructive sleep apnea, on CPAP at home- compliant - using as IP  LVH - new in Oct.  Secondary to hypertensive heart disease.  No further treatment per cardiology 06/2018   Sarcoidosis  Aortic insufficiency with plans for repeat echo in 1 year per cardiology 06/2018  Other Active Problems  Microcytic anemia 9.3  Creatinine - 1.21  Substance abuse history  Hx Left foot lesser toe amputation using Silvadene cream.  WOC RN consulted.  Continue current plan of care.  Follow by outpatient physician as PTA  Hx GIB, history of hematochezia August 2019 with colonoscopy revealing several ulcers and polyps.  Refer to GI 05/2018  Schizoaffective disorder, bipolar type  Neuropathy  Chronic constipation  Contact precautions for MRSA  Vonzella Nipple, MSN, APRN, FNP-C Advanced Practice Stroke Nurse [place] for Schedule & Pager information 09/05/2018 2:39 PM   Hospital day # 6  I have personally obtained history,examined this patient, reviewed notes, independently viewed imaging studies, participated in medical decision making and plan of care.ROS completed by me personally and pertinent positives fully documented  I have made any additions or clarifications directly to the above note. Agree with note above.  Await nursing  home placement when bed available  Antony Contras, MD Medical Director [place] Pager: [phone] 09/05/2018 4:34 PM    To contact Stroke Continuity provider, please refer to [email or web address] After hours, contact General Neurology

## 2018-09-06 LAB — GLUCOSE, CAPILLARY
Glucose-Capillary: 129 mg/dL — ABNORMAL HIGH (ref 70–99)
Glucose-Capillary: 133 mg/dL — ABNORMAL HIGH (ref 70–99)
Glucose-Capillary: 218 mg/dL — ABNORMAL HIGH (ref 70–99)
Glucose-Capillary: 196 mg/dL — ABNORMAL HIGH (ref 70–99)

## 2018-09-06 NOTE — Plan of Care (Signed)
Patient stable, discussed POC with patient, agreeable with plan, denies question/concerns at this time.  

## 2018-09-06 NOTE — Clinical Social Work Note (Signed)
Clinical Social Work Assessment  Patient Details  Name: Diamond Rice MRN: [id number] Date of Birth: Mar 02, 1971  Date of referral:  09/06/18               Reason for consult:  Facility Placement                Permission sought to share information with:  Facility Sport and exercise psychologist, Family Supports Permission granted to share information::  Yes, Verbal Permission Granted  Name::     [employer]::  SNF  Relationship::  Environmental manager Information:     Housing/Transportation Living arrangements for the past 2 months:  [place], [place] of Information:  Patient, Medical Team Patient Interpreter Needed:  None Criminal Activity/Legal Involvement Pertinent to Current Situation/Hospitalization:  No - Comment as needed Significant Relationships:  Siblings Lives with:  Self, Other (Comment) Do you feel safe going back to the place where you live?  Yes Need for family participation in patient care:  Yes (Comment)  Care giving concerns:  Patient had been at [facility] from October through December and has used all of her SNF days with her Medicare. Patient continues to need placement at this time, but her Medicaid was canceled due to not receiving her renewal paperwork while she was in [place].    Social Worker assessment / plan:  CSW previously worked to obtain information from Scientist, water quality and county on the patient's Medicaid status. CSW met with patient to update her on the situation, that we would need to reactivate her Medicaid in order to obtain placement for her. Patient indicated understanding. CSW sent an email this morning to financial counseling to explain the patient's Medicaid has been canceled, and to ask if financial counseling office can assist with completing her Medicaid application; awaiting return email. CSW to follow.  Employment status:  Disabled (Comment on whether or not currently receiving Disability) Insurance information:   Medicare PT Recommendations:  Inpatient Rehab Consult Information / Referral to community resources:  [place]  Patient/Family's Response to care:  Patient agreeable to SNF placement.  Patient/Family's Understanding of and Emotional Response to Diagnosis, Current Treatment, and Prognosis:  Patient is aware that she needs help at discharge and doesn't have a consistent support system available to help her. Patient would like to transfer closer to her brother who works at a SNF in [place].   Emotional Assessment Appearance:  Appears stated age Attitude/Demeanor/Rapport:  Engaged Affect (typically observed):  Pleasant Orientation:  Oriented to Self, Oriented to Place, Oriented to  Time, Oriented to Situation Alcohol / Substance use:  Not Applicable Psych involvement (Current and /or in the community):  No (Comment)  Discharge Needs  Concerns to be addressed:  Care Coordination Readmission within the last 30 days:  No Current discharge risk:  Physical Impairment, Dependent with Mobility, Lives alone Barriers to Discharge:  Continued Medical Work up, Inadequate or no insurance, [place],  09/06/2018, 4:58 PM

## 2018-09-06 NOTE — Progress Notes (Addendum)
Physical Therapy Treatment Patient Details Name: Diamond Rice MRN: [id number] DOB: 16-Feb-1971 Today's Date: 09/06/2018    History of Present Illness Pt adm with lt sided weakness and CT showed hemorrhagic infarct arising in the left posterior lentiform nucleus. PMH:  rt CVA, lt transmet amputation 05/28/18, Sleep apnea, COPD, smoker, DM, Bipolar, HTN, anxiety. Pt with recent SNF stay from Oct - Dec 31st after transmetatarsal amputation prior to this admission.     PT Comments    Pt found to be lying in a very large, loose BM upon arrival. Majority of session spent on pericare; therefore, ambulation was limited secondary to fatigue. Pt would continue to benefit from skilled physical therapy services at this time while admitted and after d/c to address the below listed limitations in order to improve overall safety and independence with functional mobility.   Follow Up Recommendations  SNF     Equipment Recommendations  None recommended by PT    Recommendations for Other Services       Precautions / Restrictions Precautions Precautions: Fall Restrictions Weight Bearing Restrictions: No    Mobility  Bed Mobility Overal bed mobility: Needs Assistance Bed Mobility: Rolling;Sidelying to Sit Rolling: Min guard Sidelying to sit: Min assist       General bed mobility comments: pt rolling bilaterally with use of bed rail for peri care as pt was covered in BM; min A needed for trunk elevation to achieve sitting EOB  Transfers Overall transfer level: Needs assistance Equipment used: 1 person hand held assist Transfers: Sit to/from Stand Sit to Stand: Min guard         General transfer comment: min guard for safety with transition into standing from EOB  Ambulation/Gait Ambulation/Gait assistance: Min assist Gait Distance (Feet): 5 Feet Assistive device: 1 person hand held assist Gait Pattern/deviations: Step-through pattern;Decreased step length - left;Decreased step  length - right;Decreased stride length Gait velocity: decreased Gait velocity interpretation: <1.31 ft/sec, indicative of household ambulator General Gait Details: assist for balance to ambulate from bed to recliner chair; further ambulation deferred as pt with family visiting and fatigued from clean up   Stairs             Wheelchair Mobility    Modified Rankin (Stroke Patients Only) Modified Rankin (Stroke Patients Only) Pre-Morbid Rankin Score: Moderate disability Modified Rankin: Moderately severe disability     Balance Overall balance assessment: Needs assistance Sitting-balance support: No upper extremity supported;Feet supported Sitting balance-Leahy Scale: Fair     Standing balance support: Single extremity supported Standing balance-Leahy Scale: Poor                              Cognition Arousal/Alertness: Awake/alert Behavior During Therapy: Flat affect Overall Cognitive Status: Within Functional Limits for tasks assessed                                        Exercises      General Comments        Pertinent Vitals/Pain Pain Assessment: No/denies pain    Home Living                      Prior Function            PT Goals (current goals can now be found in the care plan section) Acute Rehab PT Goals  PT Goal Formulation: With patient Time For Goal Achievement: 09/14/18 Potential to Achieve Goals: Good Progress towards PT goals: Progressing toward goals    Frequency    Min 4X/week      PT Plan Current plan remains appropriate    Co-evaluation              AM-PAC PT "6 Clicks" Mobility   Outcome Measure  Help needed turning from your back to your side while in a flat bed without using bedrails?: None Help needed moving from lying on your back to sitting on the side of a flat bed without using bedrails?: A Little Help needed moving to and from a bed to a chair (including a wheelchair)?: A  Little Help needed standing up from a chair using your arms (e.g., wheelchair or bedside chair)?: A Little Help needed to walk in hospital room?: A Little Help needed climbing 3-5 steps with a railing? : A Lot 6 Click Score: 18    End of Session   Activity Tolerance: Patient tolerated treatment well Patient left: in chair;with call bell/phone within reach;with chair alarm set;with family/visitor present Nurse Communication: Mobility status PT Visit Diagnosis: Hemiplegia and hemiparesis;Other abnormalities of gait and mobility (R26.89) Hemiplegia - Right/Left: Left Hemiplegia - dominant/non-dominant: Non-dominant Hemiplegia - caused by: Nontraumatic intracerebral hemorrhage     Time: 1456-1535 PT Time Calculation (min) (ACUTE ONLY): 39 min  Charges:  $Gait Training: 8-22 mins $Therapeutic Activity: 23-37 mins                     Sherie Don, PT, DPT  Acute Rehabilitation Services Pager [phone] Office [place] 09/06/2018, 4:37 PM

## 2018-09-06 NOTE — Progress Notes (Addendum)
STROKE TEAM PROGRESS NOTE   SUBJECTIVE (INTERVAL HISTORY)  Patient in bed, awake, alert, NAD. Waiting on insurance approval for SNF. OBJECTIVE Vitals:   09/05/18 1641 09/05/18 2003 09/06/18 0057 09/06/18 0502  BP: 134/76 138/85 104/66 104/71  Pulse: 70 70 66 61  Resp: 17 18 15 18   Temp: 98.5 F (36.9 C) 98 F (36.7 C) 98.2 F (36.8 C) 98.1 F (36.7 C)  TempSrc: Oral Oral  Oral  SpO2: 100% 99% 100% 100%  Weight:      Height:        CBC:  Recent Labs  Lab 08/30/18 1312 08/30/18 1328 09/03/18 0620  WBC 8.5  --  7.4  NEUTROABS 3.9  --   --   HGB 10.2* 11.6* 9.3*  HCT 35.0* 34.0* 30.5*  MCV 74.2*  --  71.8*  PLT 357  --  194    Basic Metabolic Panel:  Recent Labs  Lab 08/30/18 1312 08/30/18 1328 09/03/18 0620  NA 141 143 139  K 3.6 3.6 3.9  CL 108 108 107  CO2 24  --  24  GLUCOSE 139* 135* 126*  BUN 9 13 13   CREATININE 1.24* 1.10* 1.21*  CALCIUM 9.5  --  8.9     Blood pressure 104/71, pulse 61, temperature 98.1 F (36.7 C), temperature source Oral, resp. rate 18, height 5\' 1"  (1.549 m), weight 88.5 kg, SpO2 100 %.  PHYSICAL EXAM  Per Dr. Leonie Rice Blood pressure 118/71, pulse 65, temperature 98.3 F (36.8 C), resp. rate 18, height 5\' 1"  (1.549 m), weight 88.5 kg, SpO2 100 %. Pleasant middle aged african american lady not in distress. . Afebrile. Head is nontraumatic. Neck is supple without bruit.    Cardiac exam no murmur or gallop. Lungs are clear to auscultation. Distal pulses are well felt. Neurological Exam ;  Awake alert oriented x 3 normal speech and language. Mild dysarthria. Mild left lower face asymmetry. Tongue midline. No drift. Mild diminished fine finger movements on left. Orbits right over left upper extremity. Mild left grip weak. Decrease sensation on the left face, and arm Normal coordination.    ASSESSMENT/PLAN [name] is a 48 y.o. female with history of PE (10/19 on xarelto), CVA ( 2016; residual left side weakness), HTN, ASPVD,  DM HLD, COPD  presenting with left arm numbness and facial droop. She did not receive IV t-PA due to [place].  Stroke: right posterior BG/PLIC hemorrhage   CT head 1/30 1330 - hemorrhagic infarct arising in the right  posterior lentiform nucleus - multiple old infarcts  CT head 1/30 2024 - no change in hematoma/hemorrhagic infarct posterior right basal ganglia/lateral thalamus/posterior limb internal capsule.  Small vessel disease  MRI/MRA head and neck unchanged R BG hmg. No ELVO. Normal neck. 10-20 scattered microhemorrhages. Chronic Small vessel disease.   Bilateral lower extremity venous dopplers - negative for DVT  2D Echo - 06/24/18  - EF 65 - 70%. No cardiac source of emboli identified.   LDL  104  HgbA1c 7.0  UDS still not drawn. It's yield is now likely unfruitful  VTE prophylaxis - SCDs   clopidogrel 75 mg daily, Xarelto (rivaroxaban) daily but recently ran out of medications prior to admission, now on No antithrombotic.  No longer an AC candidate. Per Dr. Leonie Rice, add aspirin 81 mg daily today.   Therapy recommendations:  CIR (no caregiver support) SNF (no MCR days left).   Disposition:  pending - SW working on reactivating MCD. Pt looking to family  to assist  Pulmonary embolus  Diagnosed 05/2018 - provoked event as she had undergone a left 3-5 digit toe amputation on 10/1 with only physical activity being PT, otherwise she does not walk.  Planned tx x 3 months with AC then change to aspirin and plavix  On xarleto, but ran out and did not refill dt lack of money  No longer an [facility] candidate  Hypertension  Initial blood pressure goal less than 140   Blood pressure goal now less than 160   Home medications: Catapres 0.3 3 times daily, hydralazine 50 mg 4 times daily, lisinopril 30 mg a day, metoprolol 25 mg a day  Home meds all renewed in hospital  BP stable . Long-term BP goal normotensive  Hyperlipidemia, LDL 104, on no statin PTA, now on no statin given  hemorrhage  Diabetes  HgbA1c 7.0, goal < 7.0 - much improved. 2 yrs previous 12.3  Under management. Appears controlled   Other Stroke Risk Factors  Former cigarette smoker - quit  Substance abuse - crack cocaine. UDS   Obesity, Body mass index is 36.87 kg/m., recommend weight loss, diet and exercise as appropriate   Hx stroke/TIA  08/6551 small R PLIC infarct d/t SVD. Started on aspirin 325. D/c to CIR. HTN. HLD, DB, THC use, ETOH use, morbid obesity, OSA - noncompliant with medications  Family hx stroke (father - brothers)  Migraines  Obstructive sleep apnea, on CPAP at home- compliant - using as IP  LVH - new in Oct.  Secondary to hypertensive heart disease.  No further treatment per cardiology 06/2018   Sarcoidosis  Aortic insufficiency with plans for repeat echo in 1 year per cardiology 06/2018  Other Active Problems  Microcytic anemia 9.3  Creatinine - 1.21  Substance abuse history  Hx Left foot lesser toe amputation using Silvadene cream.  WOC RN consulted.  Continue current plan of care.  Follow by outpatient physician as PTA  Hx GIB, history of hematochezia August 2019 with colonoscopy revealing several ulcers and polyps.  Refer to GI 05/2018  Schizoaffective disorder, bipolar type  Neuropathy  Chronic constipation  Contact precautions for MRSA  Vonzella Nipple, MSN, APRN, FNP-C Advanced Practice Stroke Nurse [place] for Schedule & Pager information 09/06/2018 8:16 AM   Hospital day # 7     Agree with note above.no family available at the bedside for discussion.  Antony Contras, MD Medical Director [facility] Pager: [phone] 09/06/2018 4:56 PM   To contact Stroke Continuity provider, please refer to [email or web address] After hours, contact General Neurology

## 2018-09-07 DIAGNOSIS — I161 Hypertensive emergency: Secondary | ICD-10-CM

## 2018-09-07 DIAGNOSIS — Z86711 Personal history of pulmonary embolism: Secondary | ICD-10-CM

## 2018-09-07 DIAGNOSIS — Z89422 Acquired absence of other left toe(s): Secondary | ICD-10-CM

## 2018-09-07 LAB — BASIC METABOLIC PANEL
Anion gap: 9 (ref 5–15)
BUN: 17 mg/dL (ref 6–20)
CO2: 24 mmol/L (ref 22–32)
Calcium: 9.2 mg/dL (ref 8.9–10.3)
Chloride: 105 mmol/L (ref 98–111)
Creatinine, Ser: 1.26 mg/dL — ABNORMAL HIGH (ref 0.44–1.00)
GFR calc Af Amer: 59 mL/min — ABNORMAL LOW (ref >60)
GFR calc non Af Amer: 51 mL/min — ABNORMAL LOW (ref >60)
Glucose, Bld: 139 mg/dL — ABNORMAL HIGH (ref 70–99)
Potassium: 4 mmol/L (ref 3.5–5.1)
Sodium: 138 mmol/L (ref 135–145)

## 2018-09-07 LAB — RAPID URINE DRUG SCREEN, HOSP PERFORMED
Amphetamines: NOT DETECTED
Barbiturates: NOT DETECTED
Benzodiazepines: NOT DETECTED
Cocaine: NOT DETECTED
Opiates: NOT DETECTED
Tetrahydrocannabinol: POSITIVE — AB

## 2018-09-07 LAB — GLUCOSE, CAPILLARY
Glucose-Capillary: 135 mg/dL — ABNORMAL HIGH (ref 70–99)
Glucose-Capillary: 139 mg/dL — ABNORMAL HIGH (ref 70–99)
Glucose-Capillary: 147 mg/dL — ABNORMAL HIGH (ref 70–99)

## 2018-09-07 LAB — CBC
HCT: 33.3 % — ABNORMAL LOW (ref 36.0–46.0)
Hemoglobin: 10.3 g/dL — ABNORMAL LOW (ref 12.0–15.0)
MCH: 22.8 pg — ABNORMAL LOW (ref 26.0–34.0)
MCHC: 30.9 g/dL (ref 30.0–36.0)
MCV: 73.7 fL — ABNORMAL LOW (ref 80.0–100.0)
Platelets: 351 10*3/uL (ref 150–400)
RBC: 4.52 MIL/uL (ref 3.87–5.11)
RDW: 15.4 % (ref 11.5–15.5)
WBC: 7.5 10*3/uL (ref 4.0–10.5)
nRBC: 0 % (ref 0.0–0.2)

## 2018-09-07 MED ORDER — POLYETHYLENE GLYCOL 3350 17 G PO PACK
17.0000 g | PACK | Freq: Every day | ORAL | Status: DC | PRN
  Administered 2018-09-10 – 2018-09-16 (×3): 17 g via ORAL
  Filled 2018-09-07 (×3): qty 1

## 2018-09-07 NOTE — Plan of Care (Signed)
  Problem: Self-Care: Goal: Ability to communicate needs accurately will improve Outcome: Progressing Note:  Pt. talking with nurse and talking on phone with family about how she doing.

## 2018-09-07 NOTE — Progress Notes (Signed)
Pt placed on cpap for the night at previous settings 

## 2018-09-07 NOTE — Progress Notes (Signed)
STROKE TEAM PROGRESS NOTE   SUBJECTIVE (INTERVAL HISTORY)  Patient in bed, awake, alert, NAD.  Still has left-sided hemiparesis.  Had mild diarrhea yesterday on lactulose.  Patient denies taking at lactulose at home.  Will DC.  Waiting on insurance approval for SNF.  OBJECTIVE Vitals:   09/06/18 2354 09/07/18 0505 09/07/18 0742 09/07/18 1334  BP: 108/76 115/78 (!) 140/93 (!) 145/81  Pulse: 68 63 62 70  Resp: 18 18 18 18   Temp: 97.8 F (36.6 C) 98.2 F (36.8 C) 98.9 F (37.2 C)   TempSrc: Oral Oral    SpO2: 100% 100% 100% 100%  Weight:      Height:        CBC:  Recent Labs  Lab 09/03/18 0620 09/07/18 0436  WBC 7.4 7.5  HGB 9.3* 10.3*  HCT 30.5* 33.3*  MCV 71.8* 73.7*  PLT 304 443    Basic Metabolic Panel:  Recent Labs  Lab 09/03/18 0620 09/07/18 0436  NA 139 138  K 3.9 4.0  CL 107 105  CO2 24 24  GLUCOSE 126* 139*  BUN 13 17  CREATININE 1.21* 1.26*  CALCIUM 8.9 9.2    PHYSICAL EXAM   Temp:  [97.8 F (36.6 C)-98.9 F (37.2 C)] 98.9 F (37.2 C) (01/11 0742) Pulse Rate:  [62-78] 70 (01/11 1334) Resp:  [16-18] 18 (01/11 1334) BP: (108-156)/(76-99) 145/81 (01/11 1334) SpO2:  [100 %] 100 % (01/11 1334)  General - Well nourished, well developed, in no apparent distress.  Ophthalmologic - fundi not visualized due to noncooperation.  Cardiovascular - Regular rate and rhythm.  Mental Status -  Level of arousal and orientation to time, place, and person were intact. Language including expression, naming, repetition, comprehension was assessed and found intact.  Mild dysarthria  Cranial Nerves II - XII - II - Visual field intact OU. III, IV, VI - Extraocular movements intact. V - Facial sensation decreased on the left VII - left facial droop VIII - Hearing & vestibular intact bilaterally. X - Palate elevates symmetrically, mild dysarthria. XI - Chin turning & shoulder shrug intact bilaterally. XII - Tongue protrusion intact.  Motor Strength - The  patient's strength was normal in right upper and lower extremities, however left upper extremity is 3/5 and left lower extremity 2/5, left fourth and fifth digit amputation.  Bulk was normal and fasciculations were absent.   Motor Tone - Muscle tone was assessed at the neck and appendages and was normal.  Reflexes - The patient's reflexes were symmetrical in all extremities and she had no pathological reflexes.  Sensory - Light touch, temperature/pinprick were assessed and were decreased on the left  Coordination - The patient had normal movements in the right hand with no ataxia or dysmetria.  Tremor was absent.  Gait and Station - deferred.    ASSESSMENT/PLAN [name] is a 48 y.o. female with history of PE (10/19 on xarelto), CVA ( 2016; residual left side weakness), HTN, ASPVD, DM HLD, COPD  presenting with left arm numbness and facial droop. She did not receive IV t-PA due to [place].  ICH - right posterior BG/PLIC hemorrhage likely due to uncontrolled hypertension   CT head 1/30 1330 - ICH in the right  posterior lentiform nucleus - multiple old infarcts  CT head 1/30 2024 - no change  MRI/MRA head and neck unchanged R BG hmg. 10-20 scattered microhemorrhages.   Bilateral lower extremity venous dopplers - negative for DVT  2D Echo - 06/24/18  -  EF 65 - 70%. No cardiac source of emboli identified.   LDL  104  HgbA1c 7.0  VTE prophylaxis - SCDs   clopidogrel 75 mg daily, Xarelto (rivaroxaban) daily but recently ran out of medications prior to admission, now on No antithrombotic.    Therapy recommendations:  SNF   Disposition:  pending   PE  Diagnosed 05/2018 - provoked event as she had undergone a left 3-5 digit toe amputation on 10/1 with only physical activity being PT, otherwise she does not walk.  Planned tx x 3 months with AC then change to aspirin and plavix  On xarleto, but ran out and did not refill dt lack of money  No longer an [facility] candidate  secondary to [place].  Hypertensive emergency  Blood pressure goal less than 160   Home medications: Catapres 0.3 3 times daily, hydralazine 50 mg 4 times daily, lisinopril 30 mg a day, metoprolol 25 mg a day  Home meds all resumed in hospital  BP stable . Long-term BP goal normotensive  Hx of stroke  04/7529 small R PLIC infarct d/t SVD. Started on aspirin 325. D/c to CIR. HTN. HLD, DB, THC use, ETOH use, morbid obesity, OSA - noncompliant with medications  Hyperlipidemia, LDL 104, on no statin PTA, now on no statin given hemorrhage  Diabetes  HgbA1c 7.0, goal < 7.0 - much improved. 2 yrs previous 12.3  Under management. Appears controlled   SSI  CBG monitoring  Other Stroke Risk Factors  Former cigarette smoker - quit  Substance abuse - THC  Obesity, Body mass index is 36.87 kg/m., recommend weight loss, diet and exercise as appropriate   Family hx stroke (father - brothers)  Migraines  Obstructive sleep apnea, on CPAP at home- compliant - using as IP  Other Active Problems  Microcytic anemia 9.3-> 10.3  CKD stage II Creatinine - 1.21-> 1.26  Substance abuse history  Hx Left foot lesser toe amputation using Silvadene cream.  WOC RN consulted.  Continue current plan of care.  Follow by outpatient physician as PTA  Hx GIB, history of hematochezia August 2019 with colonoscopy revealing several ulcers and polyps.  Refer to GI 05/2018  Schizoaffective disorder, bipolar type  Neuropathy  Chronic constipation -on MiraLAX.  Patient denies lactulose at home  Contact precautions for [facility] day # 8   Rosalin Hawking, MD PhD Stroke Neurology 09/07/2018 3:51 PM   To contact Stroke Continuity provider, please refer to [email or web address] After hours, contact General Neurology

## 2018-09-08 LAB — GLUCOSE, CAPILLARY
GLUCOSE-CAPILLARY: 167 mg/dL — AB (ref 70–99)
Glucose-Capillary: 135 mg/dL — ABNORMAL HIGH (ref 70–99)
Glucose-Capillary: 98 mg/dL (ref 70–99)
Glucose-Capillary: 100 mg/dL — ABNORMAL HIGH (ref 70–99)
Glucose-Capillary: 183 mg/dL — ABNORMAL HIGH (ref 70–99)

## 2018-09-08 MED ORDER — GABAPENTIN 100 MG PO CAPS
100.0000 mg | ORAL_CAPSULE | Freq: Two times a day (BID) | ORAL | Status: DC
  Administered 2018-09-08 – 2018-09-20 (×25): 100 mg via ORAL
  Filled 2018-09-08 (×25): qty 1

## 2018-09-08 NOTE — Progress Notes (Addendum)
STROKE TEAM PROGRESS NOTE   SUBJECTIVE (INTERVAL HISTORY)  Patient in bed, awake, alert, NAD.  Still has left-sided hemiparesis.  Eat well. Waiting on insurance approval for SNF.  OBJECTIVE Vitals:   09/07/18 2338 09/08/18 0345 09/08/18 0800 09/08/18 1241  BP: 133/89 140/81 (!) 162/96 126/79  Pulse: 77 81 60 73  Resp: 18 20 18 18   Temp: 98.4 F (36.9 C) 98.7 F (37.1 C) 98.6 F (37 C) 98.4 F (36.9 C)  TempSrc: Oral Oral Oral Oral  SpO2: 100% 100% 100% 100%  Weight:      Height:        CBC:  Recent Labs  Lab 09/03/18 0620 09/07/18 0436  WBC 7.4 7.5  HGB 9.3* 10.3*  HCT 30.5* 33.3*  MCV 71.8* 73.7*  PLT 304 233    Basic Metabolic Panel:  Recent Labs  Lab 09/03/18 0620 09/07/18 0436  NA 139 138  K 3.9 4.0  CL 107 105  CO2 24 24  GLUCOSE 126* 139*  BUN 13 17  CREATININE 1.21* 1.26*  CALCIUM 8.9 9.2    PHYSICAL EXAM   Temp:  [98.4 F (36.9 C)-98.7 F (37.1 C)] 98.4 F (36.9 C) (01/12 1241) Pulse Rate:  [60-81] 73 (01/12 1241) Resp:  [18-20] 18 (01/12 1241) BP: (126-162)/(79-96) 126/79 (01/12 1241) SpO2:  [100 %] 100 % (01/12 1241)  General - Well nourished, well developed, in no apparent distress.  Ophthalmologic - fundi not visualized due to noncooperation.  Cardiovascular - Regular rate and rhythm.  Mental Status -  Level of arousal and orientation to time, place, and person were intact. Language including expression, naming, repetition, comprehension was assessed and found intact.  Mild dysarthria  Cranial Nerves II - XII - II - Visual field intact OU. III, IV, VI - Extraocular movements intact. V - Facial sensation decreased on the left VII - left facial droop VIII - Hearing & vestibular intact bilaterally. X - Palate elevates symmetrically, mild dysarthria. XI - Chin turning & shoulder shrug intact bilaterally. XII - Tongue protrusion intact.  Motor Strength - The patient's strength was normal in right upper and lower extremities,  however left upper extremity is 3/5 and left lower extremity 2/5, left fourth and fifth digit amputation.  Bulk was normal and fasciculations were absent.   Motor Tone - Muscle tone was assessed at the neck and appendages and was normal.  Reflexes - The patient's reflexes were symmetrical in all extremities and she had no pathological reflexes.  Sensory - Light touch, temperature/pinprick were assessed and were decreased on the left  Coordination - The patient had normal movements in the right hand with no ataxia or dysmetria.  Tremor was absent.  Gait and Station - deferred.    ASSESSMENT/PLAN [name] is a 48 y.o. female with history of PE (10/19 on xarelto), CVA ( 2016; residual left side weakness), HTN, ASPVD, DM HLD, COPD  presenting with left arm numbness and facial droop. She did not receive IV t-PA due to [place].  ICH - right posterior BG/PLIC hemorrhage likely due to uncontrolled hypertension   CT head 1/30 1330 - ICH in the right  posterior lentiform nucleus - multiple old infarcts  CT head 1/30 2024 - no change  MRI/MRA head and neck unchanged R BG hmg. 10-20 scattered microhemorrhages.   Bilateral lower extremity venous dopplers - negative for DVT  2D Echo - 06/24/18  - EF 65 - 70%. No cardiac source of emboli identified.   LDL  104  HgbA1c 7.0  VTE prophylaxis - SCDs   clopidogrel 75 mg daily, Xarelto (rivaroxaban) daily but recently ran out of medications prior to admission, now on No antithrombotic.    Therapy recommendations:  SNF   Disposition:  pending   PE  Diagnosed 05/2018 - provoked event as she had undergone a left 3-5 digit toe amputation on 10/1 with only physical activity being PT, otherwise she does not walk.  Planned tx x 3 months with AC then change to aspirin and plavix  On xarleto, but ran out and did not refill dt lack of money  No longer an [facility] candidate secondary to [place].  Hypertensive emergency  Blood pressure goal less  than 160   Home medications: Catapres 0.3 3 times daily, hydralazine 50 mg 4 times daily, lisinopril 30 mg a day, metoprolol 25 mg a day  Home meds all resumed in hospital  BP stable . Long-term BP goal normotensive  Hx of stroke  11/3566 small R PLIC infarct d/t SVD. Started on aspirin 325. D/c to CIR. HTN. HLD, DB, THC use, ETOH use, morbid obesity, OSA - noncompliant with medications  Hyperlipidemia, LDL 104, on no statin PTA, now on no statin given hemorrhage  Diabetes  HgbA1c 7.0, goal < 7.0 - much improved. 2 yrs previous 12.3  Under management. Appears controlled   SSI  CBG monitoring  Other Stroke Risk Factors  Former cigarette smoker - quit  Substance abuse - THC  Obesity, Body mass index is 36.87 kg/m., recommend weight loss, diet and exercise as appropriate   Family hx stroke (father - brothers)  Migraines  Obstructive sleep apnea, on CPAP at home- compliant - using as IP  Other Active Problems  Microcytic anemia 9.3-> 10.3 - CBC monitoring  CKD stage II Creatinine - 1.21-> 1.26 - BMP monitoring  Substance abuse history  Hx Left foot lesser toe amputation using Silvadene cream.  WOC RN consulted.  Continue current plan of care.  Follow by outpatient physician as PTA  Hx GIB, history of hematochezia August 2019 with colonoscopy revealing several ulcers and polyps.  Refer to GI 05/2018  Schizoaffective disorder, bipolar type  Neuropathy  Chronic constipation -on MiraLAX.  Patient denies lactulose at home  Contact precautions for [facility] day # 9   Rosalin Hawking, MD PhD Stroke Neurology 09/08/2018 6:18 PM    To contact Stroke Continuity provider, please refer to [email or web address] After hours, contact General Neurology

## 2018-09-08 NOTE — Progress Notes (Signed)
Pt not ready for cpap at this time.

## 2018-09-08 NOTE — Progress Notes (Signed)
Dressing change to left foot ulcer completed.

## 2018-09-09 LAB — GLUCOSE, CAPILLARY
GLUCOSE-CAPILLARY: 130 mg/dL — AB (ref 70–99)
Glucose-Capillary: 113 mg/dL — ABNORMAL HIGH (ref 70–99)
Glucose-Capillary: 177 mg/dL — ABNORMAL HIGH (ref 70–99)
Glucose-Capillary: 256 mg/dL — ABNORMAL HIGH (ref 70–99)

## 2018-09-09 LAB — BASIC METABOLIC PANEL
Anion gap: 7 (ref 5–15)
BUN: 18 mg/dL (ref 6–20)
CO2: 26 mmol/L (ref 22–32)
Calcium: 8.9 mg/dL (ref 8.9–10.3)
Chloride: 103 mmol/L (ref 98–111)
Creatinine, Ser: 1.2 mg/dL — ABNORMAL HIGH (ref 0.44–1.00)
GFR calc Af Amer: >60 mL/min (ref >60)
GFR calc non Af Amer: 54 mL/min — ABNORMAL LOW (ref >60)
Glucose, Bld: 134 mg/dL — ABNORMAL HIGH (ref 70–99)
POTASSIUM: 3.7 mmol/L (ref 3.5–5.1)
SODIUM: 136 mmol/L (ref 135–145)

## 2018-09-09 LAB — CBC
HCT: 31.3 % — ABNORMAL LOW (ref 36.0–46.0)
Hemoglobin: 9.8 g/dL — ABNORMAL LOW (ref 12.0–15.0)
MCH: 22.5 pg — ABNORMAL LOW (ref 26.0–34.0)
MCHC: 31.3 g/dL (ref 30.0–36.0)
MCV: 71.8 fL — ABNORMAL LOW (ref 80.0–100.0)
NRBC: 0 % (ref 0.0–0.2)
Platelets: 357 10*3/uL (ref 150–400)
RBC: 4.36 MIL/uL (ref 3.87–5.11)
RDW: 15.1 % (ref 11.5–15.5)
WBC: 7.4 10*3/uL (ref 4.0–10.5)

## 2018-09-09 MED ORDER — ASPIRIN EC 81 MG PO TBEC
81.0000 mg | DELAYED_RELEASE_TABLET | Freq: Every day | ORAL | Status: DC
  Administered 2018-09-09 – 2018-09-21 (×13): 81 mg via ORAL
  Filled 2018-09-09 (×13): qty 1

## 2018-09-09 NOTE — Progress Notes (Signed)
Physical Therapy Treatment Patient Details Name: Diamond Rice MRN: [id number] DOB: October 22, 1970 Today's Date: 09/09/2018    History of Present Illness Pt adm with lt sided weakness and CT showed hemorrhagic infarct arising in the left posterior lentiform nucleus. PMH:  rt CVA, lt transmet amputation 05/28/18, Sleep apnea, COPD, smoker, DM, Bipolar, HTN, anxiety. Pt with recent SNF stay from Oct - Dec 31st after transmetatarsal amputation prior to this admission.     PT Comments    Patient seen for mobility progression. Patient sleepy, but easily arouseable - patient stating due to medications. Patient requiring up to min-mod A for all bed mobility and OOB mobility. Noted general unsteadiness and weakness upon standing requiring physical assist from PT as well as cueing and education on safety and sequencing with patient limiting gait due to reported R ankle pain - nursing notified. Patient to continue to benefit from skilled PT services. Will continue to follow.   Follow Up Recommendations  SNF     Equipment Recommendations  None recommended by PT    Recommendations for Other Services Rehab consult     Precautions / Restrictions Precautions Precautions: Fall Restrictions Weight Bearing Restrictions: No    Mobility  Bed Mobility Overal bed mobility: Needs Assistance Bed Mobility: Supine to Sit     Supine to sit: HOB elevated;Mod assist;Min assist     General bed mobility comments: light Mod A to Min A for supine to sit - physcial assist to bring LE to EOB and for hand placement; patient the using R UE to pull up on PT; able to sit EOB with 1 UE support with SUP  Transfers Overall transfer level: Needs assistance Equipment used: Rolling walker (2 wheeled) Transfers: Sit to/from Stand Sit to Stand: Min assist;Min guard         General transfer comment: Min A to min guard for sit to stand (x2 at bedside and x1 at recliner) assist for L UE on RW and for initial  steadying  Ambulation/Gait Ambulation/Gait assistance: Min assist Gait Distance (Feet): 15 Feet Assistive device: Rolling walker (2 wheeled) Gait Pattern/deviations: Step-to pattern;Step-through pattern;Decreased stride length;Trunk flexed Gait velocity: decreased   General Gait Details: assist for steadying and RW progression; unsteadiness throughout with reduced weight shift to L LE   Stairs             Wheelchair Mobility    Modified Rankin (Stroke Patients Only) Modified Rankin (Stroke Patients Only) Pre-Morbid Rankin Score: Moderate disability Modified Rankin: Moderately severe disability     Balance Overall balance assessment: Needs assistance Sitting-balance support: Single extremity supported;Feet supported Sitting balance-Leahy Scale: Fair     Standing balance support: Bilateral upper extremity supported;During functional activity Standing balance-Leahy Scale: Poor                              Cognition Arousal/Alertness: Awake/alert Behavior During Therapy: Flat affect Overall Cognitive Status: Within Functional Limits for tasks assessed                                 General Comments: patient states she is fatigued due to medications      Exercises      General Comments        Pertinent Vitals/Pain Pain Assessment: Faces Faces Pain Scale: Hurts a little bit Pain Location: R ankle Pain Descriptors / Indicators: Aching;Discomfort Pain Intervention(s): Limited activity within  patient's tolerance;Monitored during session;Repositioned    Home Living                      Prior Function            PT Goals (current goals can now be found in the care plan section) Acute Rehab PT Goals Patient Stated Goal: to return to her PLOF PT Goal Formulation: With patient Time For Goal Achievement: 09/14/18 Potential to Achieve Goals: Good Progress towards PT goals: Progressing toward goals    Frequency    Min  4X/week      PT Plan Current plan remains appropriate    Co-evaluation              AM-PAC PT "6 Clicks" Mobility   Outcome Measure  Help needed turning from your back to your side while in a flat bed without using bedrails?: A Little Help needed moving from lying on your back to sitting on the side of a flat bed without using bedrails?: A Little Help needed moving to and from a bed to a chair (including a wheelchair)?: A Little Help needed standing up from a chair using your arms (e.g., wheelchair or bedside chair)?: A Little Help needed to walk in hospital room?: A Little Help needed climbing 3-5 steps with a railing? : A Lot 6 Click Score: 17    End of Session Equipment Utilized During Treatment: Gait belt Activity Tolerance: Patient tolerated treatment well Patient left: in chair;with call bell/phone within reach;with chair alarm set Nurse Communication: Mobility status PT Visit Diagnosis: Hemiplegia and hemiparesis;Other abnormalities of gait and mobility (R26.89) Hemiplegia - Right/Left: Left Hemiplegia - dominant/non-dominant: Non-dominant Hemiplegia - caused by: Nontraumatic intracerebral hemorrhage     Time: 6283-6629 PT Time Calculation (min) (ACUTE ONLY): 27 min  Charges:  $Gait Training: 8-22 mins $Therapeutic Activity: 8-22 mins                      Lanney Gins, PT, DPT Supplemental Physical Therapist 09/09/18 10:29 AM Pager: [phone] Office: [phone]

## 2018-09-09 NOTE — Progress Notes (Addendum)
STROKE TEAM PROGRESS NOTE   SUBJECTIVE (INTERVAL HISTORY)  No new complaints. Stable. Awaiting MCD.  OBJECTIVE Vitals:   09/08/18 2003 09/08/18 2353 09/09/18 0409 09/09/18 0741  BP: 138/81 121/88 (!) 126/96 (!) 141/90  Pulse: 71 63 60 60  Resp:    16  Temp: 98.6 F (37 C) 98.5 F (36.9 C) 97.7 F (36.5 C) 97.7 F (36.5 C)  TempSrc: Oral Oral Oral Oral  SpO2: 99% 99% 100% 99%  Weight:      Height:        CBC:  Recent Labs  Lab 09/07/18 0436 09/09/18 0527  WBC 7.5 7.4  HGB 10.3* 9.8*  HCT 33.3* 31.3*  MCV 73.7* 71.8*  PLT 351 419    Basic Metabolic Panel:  Recent Labs  Lab 09/07/18 0436 09/09/18 0527  NA 138 136  K 4.0 3.7  CL 105 103  CO2 24 26  GLUCOSE 139* 134*  BUN 17 18  CREATININE 1.26* 1.20*  CALCIUM 9.2 8.9    PHYSICAL EXAM - no change since yesterday General - Well nourished, well developed, in no apparent distress.  Cardiovascular - Regular rate and rhythm.  Mental Status -  Level of arousal and orientation to time, place, and person were intact. Language including expression, naming, repetition, comprehension was assessed and found intact.  Mild dysarthria  Cranial Nerves II - XII - II - Visual field intact OU. III, IV, VI - Extraocular movements intact. V - Facial sensation decreased on the left VII - left facial droop VIII - Hearing & vestibular intact bilaterally. X - Palate elevates symmetrically, mild dysarthria. XI - Chin turning & shoulder shrug intact bilaterally. XII - Tongue protrusion intact.  Motor Strength - The patient's strength was normal in right upper and lower extremities, however left upper extremity is 3/5 and left lower extremity 2/5, left fourth and fifth digit amputation.  Bulk was normal and fasciculations were absent.   Motor Tone - Muscle tone was assessed at the neck and appendages and was normal.  Reflexes - The patient's reflexes were symmetrical in all extremities and she had no pathological  reflexes.  Sensory - Light touch, temperature/pinprick were assessed and were decreased on the left  Coordination - The patient had normal movements in the right hand with no ataxia or dysmetria.  Tremor was absent.  Gait and Station - deferred.   ASSESSMENT/PLAN [name] is a 48 y.o. female with history of PE (10/19 on xarelto), CVA ( 2016; residual left side weakness), HTN, ASPVD, DM HLD, COPD  presenting with left arm numbness and facial droop. She did not receive IV t-PA due to [place].  ICH - right posterior BG/PLIC hemorrhage likely due to uncontrolled hypertension   CT head 1/30 1330 - ICH in the right  posterior lentiform nucleus - multiple old infarcts  CT head 1/30 2024 - no change  MRI/MRA head and neck unchanged R BG hmg. 10-20 scattered microhemorrhages.   Bilateral lower extremity venous dopplers - negative for DVT  2D Echo - 06/24/18  - EF 65 - 70%. No cardiac source of emboli identified.   LDL  104  HgbA1c 7.0  VTE prophylaxis - SCDs   clopidogrel 75 mg daily, Xarelto (rivaroxaban) daily but recently ran out of medications prior to admission, added aspirin 81 mg daily today. Not a long-term AC candidate    Therapy recommendations:  SNF   Disposition:  pending - await MCD reinstatement  PE  Diagnosed 05/2018 - provoked event  as she had undergone a left 3-5 digit toe amputation on 10/1 with only physical activity being PT, otherwise she does not walk.  Planned tx x 3 months with AC then change to aspirin and plavix  On xarleto, but ran out and did not refill dt lack of money  No longer an [facility] candidate secondary to [place].  Hypertensive emergency  Blood pressure goal less than 160   Home medications: Catapres 0.3 3 times daily, hydralazine 50 mg 4 times daily, lisinopril 30 mg a day, metoprolol 25 mg a day  Home meds all resumed in hospital  BP stable . Long-term BP goal normotensive  Hx of stroke  02/487 small R PLIC infarct d/t SVD.  Started on aspirin 325. D/c to CIR. HTN. HLD, DB, THC use, ETOH use, morbid obesity, OSA - noncompliant with medications  Hyperlipidemia, LDL 104, on no statin PTA, now on no statin given hemorrhage  Diabetes  HgbA1c 7.0, goal < 7.0 - much improved. 2 yrs previous 12.3  Under management. Appears controlled   SSI  CBG monitoring  Other Stroke Risk Factors  Former cigarette smoker - quit  Substance abuse - THC  Obesity, Body mass index is 36.87 kg/m., recommend weight loss, diet and exercise as appropriate   Family hx stroke (father - brothers)  Migraines  Obstructive sleep apnea, on CPAP at home- compliant - using as IP  Other Active Problems  Microcytic anemia 9.8- CBC monitoring  CKD stage II Creatinine - 1.20 - BMP monitoring  Substance abuse history  Hx Left foot lesser toe amputation using Silvadene cream.  WOC RN consulted.  Continue current plan of care.  Follow by outpatient physician as PTA  Hx GIB, history of hematochezia August 2019 with colonoscopy revealing several ulcers and polyps.  Refer to GI 05/2018  Schizoaffective disorder, bipolar type  Neuropathy  Chronic constipation -on MiraLAX.  Patient denies lactulose at home  Contact precautions for [facility]  Hospital day # [place], MSN, APRN, ANVP-BC, AGPCNP-BC Advanced Practice Stroke Nurse [place] for Schedule & Pager information 09/09/2018 3:31 PM   ATTENDING NOTE: I reviewed above note and agree with the assessment and plan. Pt was seen and examined.   No acute event overnight, neurological stable, still has left hemiparesis, working with PT/OT.  Creatinine stable from previous 1.26 down to 1.20.  Anemia stable at 9.8.  Continue iron pills.  Social worker is working on placement.  Aspirin 81 added for stroke prevention and history of PE.  BP stable.  Rosalin Hawking, MD PhD Stroke Neurology 09/09/2018 6:35 PM     To contact Stroke Continuity provider,  please refer to [email or web address] After hours, contact General Neurology

## 2018-09-10 LAB — GLUCOSE, CAPILLARY
Glucose-Capillary: 134 mg/dL — ABNORMAL HIGH (ref 70–99)
Glucose-Capillary: 135 mg/dL — ABNORMAL HIGH (ref 70–99)
Glucose-Capillary: 106 mg/dL — ABNORMAL HIGH (ref 70–99)
Glucose-Capillary: 156 mg/dL — ABNORMAL HIGH (ref 70–99)
Glucose-Capillary: 212 mg/dL — ABNORMAL HIGH (ref 70–99)

## 2018-09-10 NOTE — Progress Notes (Signed)
STROKE TEAM PROGRESS NOTE   SUBJECTIVE (INTERVAL HISTORY)  Stable. Doing good. 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  OBJECTIVE Vitals:   09/10/18 0030 09/10/18 0341 09/10/18 0805 09/10/18 1139  BP:  118/80 135/89 (!) 155/92  Pulse:  69 61 64  Resp: 16 18 18 18   Temp:  98.7 F (37.1 C) 98.2 F (36.8 C) 98.5 F (36.9 C)  TempSrc:  Oral Oral Oral  SpO2:  97% 100% 100%  Weight:      Height:        CBC:  Recent Labs  Lab 09/07/18 0436 09/09/18 0527  WBC 7.5 7.4  HGB 10.3* 9.8*  HCT 33.3* 31.3*  MCV 73.7* 71.8*  PLT 351 962    Basic Metabolic Panel:  Recent Labs  Lab 09/07/18 0436 09/09/18 0527  NA 138 136  K 4.0 3.7  CL 105 103  CO2 24 26  GLUCOSE 139* 134*  BUN 17 18  CREATININE 1.26* 1.20*  CALCIUM 9.2 8.9    PHYSICAL EXAM  General - Well nourished, well developed, in no apparent distress.  Cardiovascular - Regular rate and rhythm.  Mental Status -  Level of arousal and orientation to time, place, and person were intact. Language including expression, naming, repetition, comprehension was assessed and found intact.  Mild dysarthria  Cranial Nerves II - XII - II - Visual field intact OU. III, IV, VI - Extraocular movements intact. V - Facial sensation decreased on the left VII - left facial droop VIII - Hearing & vestibular intact bilaterally. X - Palate elevates symmetrically, mild dysarthria. XI - Chin turning & shoulder shrug intact bilaterally. XII - Tongue protrusion intact.  Motor Strength - The patient's strength was normal in right upper and lower extremities, however left upper extremity is 4/5 and left lower extremity 2/5, left fourth and fifth digit amputation w/ dressing CD&I.  Bulk was normal and fasciculations were absent.   Motor Tone - Muscle tone was assessed at the neck and appendages and was normal.  Reflexes - The patient's reflexes were symmetrical in all extremities and she had no pathological  reflexes.  Sensory - Light touch, temperature/pinprick were assessed and were decreased on the left  Coordination - The patient had normal movements in the right hand with no ataxia or dysmetria.  Tremor was absent.  Gait and Station - deferred.   ASSESSMENT/PLAN [name] is a 48 y.o. female with history of PE (10/19 on xarelto), CVA ( 2016; residual left side weakness), HTN, ASPVD, DM HLD, COPD  presenting with left arm numbness and facial droop. She did not receive IV t-PA due to [place].  ICH - right posterior BG/PLIC hemorrhage likely due to uncontrolled hypertension   CT head 1/30 1330 - ICH in the right  posterior lentiform nucleus - multiple old infarcts  CT head 1/30 2024 - no change  MRI/MRA head and neck unchanged R BG hmg. 10-20 scattered microhemorrhages.   Bilateral lower extremity venous dopplers - negative for DVT  2D Echo - 06/24/18  - EF 65 - 70%. No cardiac source of emboli identified.   LDL  104  HgbA1c 7.0  VTE prophylaxis - SCDs   clopidogrel 75 mg daily, Xarelto (rivaroxaban) daily but recently ran out of medications prior to admission, added aspirin 81 mg daily today. Not a long-term AC candidate    Therapy recommendations:  SNF   Disposition:  pending - await MCD reinstatement  PE  Diagnosed 05/2018 -  provoked event as she had undergone a left 3-5 digit toe amputation on 10/1 with only physical activity being PT, otherwise she does not walk.  Planned tx x 3 months with AC then change to aspirin and plavix  On xarleto, but ran out and did not refill dt lack of money  No longer an [facility] candidate secondary to [place].  Hypertensive emergency  Blood pressure goal less than 160   Home medications: Catapres 0.3 3 times daily, hydralazine 50 mg 4 times daily, lisinopril 30 mg a day, metoprolol 25 mg a day  Home meds all resumed in hospital  BP stable . Long-term BP goal normotensive  Hx of stroke  0/1655 small R PLIC infarct d/t SVD.  Started on aspirin 325. D/c to CIR. HTN. HLD, DB, THC use, ETOH use, morbid obesity, OSA - noncompliant with medications  Hyperlipidemia  LDL 104  on no statin PTA  now on no statin given hemorrhage  Diabetes  HgbA1c 7.0, goal < 7.0 - much improved. 2 yrs previous 12.3  Under management. Appears controlled   SSI  CBG monitoring  Other Stroke Risk Factors  Former cigarette smoker - quit  Substance abuse - THC  Obesity, Body mass index is 36.87 kg/m., recommend weight loss, diet and exercise as appropriate   Family hx stroke (father - brothers)  Migraines  Obstructive sleep apnea, on CPAP at home- compliant - using as IP  Other Active Problems  Microcytic anemia 9.8- periodic CBC monitoring  CKD stage II Creatinine - 1.20 - periodic  BMP monitoring  Substance abuse history  Hx Left foot lesser toe amputation using Silvadene cream.  WOC RN consulted.  Continue current plan of care - on daily dressing changes. .  Follow by outpatient physician as PTA  Hx GIB, history of hematochezia August 2019 with colonoscopy revealing several ulcers and polyps.  Refer to GI 05/2018  Schizoaffective disorder, bipolar type  Neuropathy  Chronic constipation -on MiraLAX.  Patient denies lactulose at home  Contact precautions for MRSA  Remains stable without change. Medically ready for d/c  Hospital day # [place], MSN, APRN, ANVP-BC, AGPCNP-BC Advanced Practice Stroke Nurse [place] for Schedule & Pager information 09/10/2018 2:42 PM    To contact Stroke Continuity provider, please refer to [email or web address] After hours, contact General Neurology

## 2018-09-10 NOTE — Progress Notes (Signed)
Occupational Therapy Treatment Patient Details Name: Diamond Rice MRN: [id number] DOB: Sep 01, 1970 Today's Date: 09/10/2018    History of present illness Pt adm with lt sided weakness and CT showed hemorrhagic infarct arising in the left posterior lentiform nucleus. PMH:  rt CVA, lt transmet amputation 05/28/18, Sleep apnea, COPD, smoker, DM, Bipolar, HTN, anxiety. Pt with recent SNF stay from Oct - Dec 31st after transmetatarsal amputation prior to this admission.    OT comments  Pt progressing toward stated goals, mildly fatigued this session. Met pt finishing up with PT at sink. Completed oral care while standing at sink with verbal and tactile cueing to incorporate BUEs in task. While brushing with R hand, encouraged pt to participate in weightbearing with LUE. Pt needing seated rest break after this. Set up towel folding activity at sink to simulate PNF patterns, crossing midline, and bilateral UE integration. Pt started activity standing to challenge dynamic standing blance, completing half way then finishing while seated for a total x10 on each side of the body. Assisted pt back to bed, requiring mod A for sit > supine for BLE mgt. Educated pt on SROM to continue to facilitate normal movement patterns with BUEs and positioning in the bed. Noted slight tone in hand and elbow, improves when pt relaxes. SNF level of care appropriate at this time to maximize safety and functional independence in BADL. Will continue to follow acutely.    Follow Up Recommendations  SNF;Supervision/Assistance - 24 hour    Equipment Recommendations  Other (comment)(defer to next venue)    Recommendations for Other Services      Precautions / Restrictions Precautions Precautions: Fall Restrictions Weight Bearing Restrictions: No       Mobility Bed Mobility Overal bed mobility: Needs Assistance Bed Mobility: Sit to Supine Rolling: Supervision Sidelying to sit: Min guard   Sit to supine: Mod assist    General bed mobility comments: cues for BLE management; mod A to boost up into bed on L side, pt pulls with R side  Transfers Overall transfer level: Needs assistance Equipment used: Rolling walker (2 wheeled) Transfers: Sit to/from Stand Sit to Stand: Min guard         General transfer comment: cues for hand placement    Balance Overall balance assessment: Needs assistance Sitting-balance support: Single extremity supported;Feet supported Sitting balance-Leahy Scale: Fair     Standing balance support: Bilateral upper extremity supported;During functional activity Standing balance-Leahy Scale: Poor Standing balance comment: heavy reliance on external surface/support                           ADL either performed or assessed with clinical judgement   ADL Overall ADL's : Needs assistance/impaired     Grooming: Wash/dry face;Sitting;Oral care;Min guard;Standing;Minimal assistance Grooming Details (indicate cue type and reason): min guard A for standing balance. Encourage BUE movement in oral care task. Needs consistent cues to incorporate LUE. Needing x2 sitting rest breaks.                             Functional mobility during ADLs: Min guard;Rolling walker General ADL Comments: LUE weakness and deficits in [facility], consistent cueing needed to incorporate BUEs     Vision Baseline Vision/History: No visual deficits     Perception     Praxis      Cognition Arousal/Alertness: Awake/alert Behavior During Therapy: WFL for tasks assessed/performed Overall Cognitive Status: Within Functional  Limits for tasks assessed                                          Exercises General Exercises - Lower Extremity Long Arc Quad: AROM;Both;10 reps;Seated Hip Flexion/Marching: AROM;Both;10 reps;Seated Heel Raises: AROM;Both;10 reps;Standing(Pt fatigued after and required seated exercises to continue.  )   Shoulder Instructions       General  Comments      Pertinent Vitals/ Pain       Pain Assessment: No/denies pain Pain Location: R ankle  Home Living                                          Prior Functioning/Environment              Frequency  Min 2X/week        Progress Toward Goals  OT Goals(current goals can now be found in the care plan section)  Progress towards OT goals: Progressing toward goals  Acute Rehab OT Goals Patient Stated Goal: to move my L arm normally again OT Goal Formulation: With patient Time For Goal Achievement: 09/16/18 Potential to Achieve Goals: Good  Plan Discharge plan remains appropriate    Co-evaluation                 AM-PAC OT "6 Clicks" Daily Activity     Outcome Measure   Help from another person eating meals?: A Little Help from another person taking care of personal grooming?: A Little Help from another person toileting, which includes using toliet, bedpan, or urinal?: A Lot Help from another person bathing (including washing, rinsing, drying)?: A Lot Help from another person to put on and taking off regular upper body clothing?: A Little Help from another person to put on and taking off regular lower body clothing?: A Lot 6 Click Score: 15    End of Session Equipment Utilized During Treatment: Rolling walker;Gait belt  OT Visit Diagnosis: Muscle weakness (generalized) (M62.81);Hemiplegia and hemiparesis Hemiplegia - Right/Left: Left Hemiplegia - dominant/non-dominant: Non-Dominant   Activity Tolerance Patient tolerated treatment well   Patient Left in bed;with call bell/phone within reach;with bed alarm set   Nurse Communication Mobility status;Other (comment)(purewick replacement)        Time: 6067-7034 OT Time Calculation (min): 29 min  Charges: OT General Charges $OT Visit: 1 Visit OT Treatments $Self Care/Home Management : 8-22 mins $Neuromuscular Re-education: 8-22 mins  Zenovia Jarred, MSOT, OTR/L Behavioral  Health OT/ Acute Relief OT [facility] Office: [employer] 09/10/2018, 5:07 PM

## 2018-09-10 NOTE — Progress Notes (Signed)
Physical Therapy Treatment Patient Details Name: Diamond Rice MRN: [id number] DOB: [birth year] Today's Date: 09/10/2018    History of Present Illness Pt adm with lt sided weakness and CT showed hemorrhagic infarct arising in the left posterior lentiform nucleus. PMH:  rt CVA, lt transmet amputation 05/28/18, Sleep apnea, COPD, smoker, DM, Bipolar, HTN, anxiety. Pt with recent SNF stay from Oct - Dec 31st after transmetatarsal amputation prior to this admission.     PT Comments    Pt performed gait training and functional mobility during session this pm.  Pt able to progress gait distance and more alert requring decreased assistance to mobilize.  Pt continues to fatigue quickly.  Performed x 1 exercise in standing before requesting to sit.  Completed further exercises in seated position.  OT dovetailed session.      Follow Up Recommendations  SNF     Equipment Recommendations  None recommended by PT    Recommendations for Other Services Rehab consult     Precautions / Restrictions Precautions Precautions: Fall Restrictions Weight Bearing Restrictions: No    Mobility  Bed Mobility Overal bed mobility: Needs Assistance Bed Mobility: Rolling;Sidelying to Sit Rolling: Supervision Sidelying to sit: Min guard       General bed mobility comments: Cues for hand and foot placement.  Min guard for trunk elevation with cues to push and weight shift R to achieve upright seated position at edge of bed.    Transfers Overall transfer level: Needs assistance Equipment used: Rolling walker (2 wheeled) Transfers: Sit to/from Stand Sit to Stand: Min guard         General transfer comment: Cues for hand placement to push from seated surface into standing.  Pt reaching for RW to pull into standing.  Good eccentric loading returning to seated surface.    Ambulation/Gait Ambulation/Gait assistance: Min assist Gait Distance (Feet): 60 Feet Assistive device: Rolling walker (2  wheeled) Gait Pattern/deviations: Step-to pattern;Step-through pattern;Decreased stride length;Trunk flexed Gait velocity: decreased   General Gait Details: Cues for increased step length bilaterally.  Cues for RW safety.  Pt with B ER of hips.    Stairs             Wheelchair Mobility    Modified Rankin (Stroke Patients Only)       Balance Overall balance assessment: Needs assistance Sitting-balance support: Single extremity supported;Feet supported Sitting balance-Leahy Scale: Fair       Standing balance-Leahy Scale: Poor Standing balance comment: heavily reliant on AD.                              Cognition Arousal/Alertness: Awake/alert Behavior During Therapy: WFL for tasks assessed/performed Overall Cognitive Status: Within Functional Limits for tasks assessed                                        Exercises General Exercises - Lower Extremity Long Arc Quad: AROM;Both;10 reps;Seated Hip Flexion/Marching: AROM;Both;10 reps;Seated Heel Raises: AROM;Both;10 reps;Standing(Pt fatigued after and required seated exercises to continue.  )    General Comments        Pertinent Vitals/Pain Pain Assessment: No/denies pain Pain Location: R ankle    Home Living                      Prior Function  PT Goals (current goals can now be found in the care plan section) Acute Rehab PT Goals Potential to Achieve Goals: Good Progress towards PT goals: Progressing toward goals    Frequency    Min 4X/week      PT Plan Current plan remains appropriate    Co-evaluation              AM-PAC PT "6 Clicks" Mobility   Outcome Measure  Help needed turning from your back to your side while in a flat bed without using bedrails?: A Little Help needed moving from lying on your back to sitting on the side of a flat bed without using bedrails?: A Little Help needed moving to and from a bed to a chair (including a  wheelchair)?: A Little Help needed standing up from a chair using your arms (e.g., wheelchair or bedside chair)?: A Little Help needed to walk in hospital room?: A Little Help needed climbing 3-5 steps with a railing? : A Little 6 Click Score: 18    End of Session Equipment Utilized During Treatment: Gait belt Activity Tolerance: Patient tolerated treatment well Patient left: in chair;with call bell/phone within reach;with chair alarm set Nurse Communication: Mobility status PT Visit Diagnosis: Hemiplegia and hemiparesis;Other abnormalities of gait and mobility (R26.89) Hemiplegia - Right/Left: Left Hemiplegia - dominant/non-dominant: Non-dominant Hemiplegia - caused by: Nontraumatic intracerebral hemorrhage     Time: 1545-1605 PT Time Calculation (min) (ACUTE ONLY): 20 min  Charges:  $Gait Training: 8-22 mins                     Governor Rooks, PTA Acute Rehabilitation Services Pager [phone] Office [phone]     Miliani Deike Eli Hose 09/10/2018, 4:44 PM

## 2018-09-11 LAB — GLUCOSE, CAPILLARY
GLUCOSE-CAPILLARY: 163 mg/dL — AB (ref 70–99)
Glucose-Capillary: 122 mg/dL — ABNORMAL HIGH (ref 70–99)
Glucose-Capillary: 94 mg/dL (ref 70–99)
Glucose-Capillary: 103 mg/dL — ABNORMAL HIGH (ref 70–99)

## 2018-09-11 MED ORDER — GLUCERNA SHAKE PO LIQD
237.0000 mL | Freq: Three times a day (TID) | ORAL | Status: DC
  Administered 2018-09-11 – 2018-09-21 (×27): 237 mL via ORAL
  Filled 2018-09-11: qty 237

## 2018-09-11 NOTE — Progress Notes (Signed)
Occupational Therapy Treatment Patient Details Name: Diamond Rice MRN: [id number] DOB: 24-Apr-1971 Today's Date: 09/11/2018    History of present illness Pt adm with lt sided weakness and CT showed hemorrhagic infarct arising in the left posterior lentiform nucleus. PMH:  rt CVA, lt transmet amputation 05/28/18, Sleep apnea, COPD, smoker, DM, Bipolar, HTN, anxiety. Pt with recent SNF stay from Oct - Dec 31st after transmetatarsal amputation prior to this admission.    OT comments  Session focused on sequencing and progressing functional mobility. Pt with increased lethargy this session, requiring moderate verbal cues to initiate ADLs and mobility. Pt requriqed mod A to power up into sitting from supine and into standing from EOB. SNF remains appropriate and safe d/c.   Follow Up Recommendations  SNF;Supervision/Assistance - 24 hour    Equipment Recommendations  None recommended by OT       Precautions / Restrictions Precautions Precautions: Fall Restrictions Weight Bearing Restrictions: No       Mobility Bed Mobility Overal bed mobility: Needs Assistance Bed Mobility: Sit to Supine;Supine to Sit     Supine to sit: Mod assist;HOB elevated Sit to supine: Mod assist   General bed mobility comments: mod A provided to power up into sitting, moderate cueing for body mechanics and positioning  Transfers Overall transfer level: Needs assistance Equipment used: Rolling walker (2 wheeled) Transfers: Sit to/from Stand Sit to Stand: Mod assist Stand pivot transfers: Mod assist       General transfer comment: moderate cueing for sequencing UE/LE placement    Balance Overall balance assessment: Needs assistance Sitting-balance support: Single extremity supported;Feet supported Sitting balance-Leahy Scale: Fair     Standing balance support: Bilateral upper extremity supported;During functional activity Standing balance-Leahy Scale: Poor Standing balance comment: heavy  reliance on external surface/support                           ADL either performed or assessed with clinical judgement   ADL Overall ADL's : Needs assistance/impaired Eating/Feeding: Set up;Supervision/ safety;Sitting;Cueing for sequencing Eating/Feeding Details (indicate cue type and reason): set up to manage tray, as well as encouragement to eat                                 Functional mobility during ADLs: Minimal assistance;Rolling walker;Cueing for safety;Cueing for sequencing General ADL Comments: Heavy cueing for task initiation, attention to task, and for sequencing bed mobility               Cognition Arousal/Alertness: Lethargic Behavior During Therapy: Flat affect Overall Cognitive Status: Impaired/Different from baseline Area of Impairment: Safety/judgement;Awareness;Problem solving                         Safety/Judgement: Decreased awareness of safety;Decreased awareness of deficits Awareness: Emergent Problem Solving: Slow processing;Decreased initiation;Difficulty sequencing;Requires verbal cues                     Pertinent Vitals/ Pain       Pain Assessment: 0-10 Pain Score: 3  Pain Location: L shoulder Pain Descriptors / Indicators: Aching;Discomfort Pain Intervention(s): Limited activity within patient's tolerance;Monitored during session         Frequency  Min 2X/week        Progress Toward Goals  OT Goals(current goals can now be found in the care plan section)  Progress towards OT goals:  Progressing toward goals  Acute Rehab OT Goals Patient Stated Goal: "to break this fever" OT Goal Formulation: With patient Time For Goal Achievement: 09/16/18 Potential to Achieve Goals: Good  Plan Discharge plan remains appropriate       AM-PAC OT "6 Clicks" Daily Activity     Outcome Measure   Help from another person eating meals?: A Little Help from another person taking care of personal grooming?:  A Little Help from another person toileting, which includes using toliet, bedpan, or urinal?: A Lot Help from another person bathing (including washing, rinsing, drying)?: A Lot Help from another person to put on and taking off regular upper body clothing?: A Little Help from another person to put on and taking off regular lower body clothing?: A Lot 6 Click Score: 15    End of Session Equipment Utilized During Treatment: Rolling walker;Gait belt  OT Visit Diagnosis: Muscle weakness (generalized) (M62.81);Hemiplegia and hemiparesis Hemiplegia - Right/Left: Left Hemiplegia - dominant/non-dominant: Non-Dominant   Activity Tolerance Patient limited by lethargy   Patient Left in bed;with call bell/phone within reach;with bed alarm set   Nurse Communication Mobility status        Time: 2878-6767 OT Time Calculation (min): 23 min  Charges: OT General Charges $OT Visit: 1 Visit OT Treatments $Self Care/Home Management : 23-37 mins   Curtis Sites OTR/L  09/11/2018, 2:47 PM

## 2018-09-11 NOTE — Progress Notes (Signed)
STROKE TEAM PROGRESS NOTE   SUBJECTIVE (INTERVAL HISTORY)  No family at bedside.  Patient initially drowsy sleepy, but he still arousable, able to follow commands, refused to have a breakfast but okay to drink more fluid.  Social worker still working on placement.  OBJECTIVE Vitals:   09/10/18 2347 09/11/18 0351 09/11/18 0748 09/11/18 1200  BP: 131/81 (!) 146/88 (!) 153/89 (!) 172/98  Pulse: 76 66 84 80  Resp: 20 20 20 20   Temp: 98.2 F (36.8 C) 98.5 F (36.9 C) (!) 100.8 F (38.2 C) (!) 102.3 F (39.1 C)  TempSrc: Oral Oral Oral Oral  SpO2: 100% 100% 97% 98%  Weight:      Height:        CBC:  Recent Labs  Lab 09/07/18 0436 09/09/18 0527  WBC 7.5 7.4  HGB 10.3* 9.8*  HCT 33.3* 31.3*  MCV 73.7* 71.8*  PLT 351 811    Basic Metabolic Panel:  Recent Labs  Lab 09/07/18 0436 09/09/18 0527  NA 138 136  K 4.0 3.7  CL 105 103  CO2 24 26  GLUCOSE 139* 134*  BUN 17 18  CREATININE 1.26* 1.20*  CALCIUM 9.2 8.9    PHYSICAL EXAM - no change since yesterday General - Well nourished, well developed, in no apparent distress.  Cardiovascular - Regular rate and rhythm.  Mental Status -  Level of arousal and orientation to time, place, and person were intact. Language including expression, naming, repetition, comprehension was assessed and found intact.  Mild dysarthria  Cranial Nerves II - XII - II - Visual field intact OU. III, IV, VI - Extraocular movements intact. V - Facial sensation decreased on the left VII - left facial droop VIII - Hearing & vestibular intact bilaterally. X - Palate elevates symmetrically, mild dysarthria. XI - Chin turning & shoulder shrug intact bilaterally. XII - Tongue protrusion intact.  Motor Strength - The patient's strength was normal in right upper and lower extremities, however left upper extremity is 3-/5 and left lower extremity 2/5, left fourth and fifth digit amputation.  Bulk was normal and fasciculations were absent.   Motor  Tone - Muscle tone was assessed at the neck and appendages and was normal.  Reflexes - The patient's reflexes were symmetrical in all extremities and she had no pathological reflexes.  Sensory - Light touch, temperature/pinprick were assessed and were decreased on the left  Coordination - The patient had normal movements in the right hand with no ataxia or dysmetria.  Tremor was absent.  Gait and Station - deferred.   ASSESSMENT/PLAN [name] is a 48 y.o. female with history of PE (10/19 on xarelto), CVA ( 2016; residual left side weakness), HTN, ASPVD, DM HLD, COPD  presenting with left arm numbness and facial droop. She did not receive IV t-PA due to [place].  ICH - right posterior BG/PLIC hemorrhage likely due to uncontrolled hypertension   CT head 1/30 1330 - ICH in the right  posterior lentiform nucleus - multiple old infarcts  CT head 1/30 2024 - no change  MRI/MRA head and neck unchanged R BG hmg. 10-20 scattered microhemorrhages.   Bilateral lower extremity venous dopplers - negative for DVT  2D Echo - 06/24/18  - EF 65 - 70%. No cardiac source of emboli identified.   LDL  104  HgbA1c 7.0  VTE prophylaxis - SCDs   clopidogrel 75 mg daily, Xarelto (rivaroxaban) daily but recently ran out of medications prior to admission, added aspirin 81  mg daily today. Not a long-term AC candidate    Therapy recommendations:  SNF   Disposition:  pending - await MCD reinstatement  PE  Diagnosed 05/2018 - provoked event as she had undergone a left 3-5 digit toe amputation on 10/1 with only physical activity being PT, otherwise she does not walk.  Planned tx x 3 months with AC then change to aspirin and plavix  On xarleto, but ran out and did not refill dt lack of money  No longer an [facility] candidate secondary to [place].  Hypertensive emergency  Blood pressure goal less than 160   Home medications: Catapres 0.3 3 times daily, hydralazine 50 mg 4 times daily, lisinopril 30  mg a day, metoprolol 25 mg a day  Home meds all resumed in hospital  BP stable . Long-term BP goal normotensive  Hx of stroke  12/8590 small R PLIC infarct d/t SVD. Started on aspirin 325. D/c to CIR. HTN. HLD, DB, THC use, ETOH use, morbid obesity, OSA - noncompliant with medications  Hyperlipidemia, LDL 104, on no statin PTA, now on no statin given hemorrhage  Diabetes  HgbA1c 7.0, goal < 7.0 - much improved. 2 yrs previous 12.3  Under management. Appears controlled   SSI  CBG monitoring  Other Stroke Risk Factors  Former cigarette smoker - quit  Substance abuse - THC  Obesity, Body mass index is 36.87 kg/m., recommend weight loss, diet and exercise as appropriate   Family hx stroke (father - brothers)  Migraines  Obstructive sleep apnea, on CPAP at home- compliant - using as IP  Other Active Problems  Microcytic anemia 9.8- CBC in a.m.  CKD stage II Creatinine - 1.20 - BMP in a.m.  Substance abuse history  Hx Left foot lesser toe amputation using Silvadene cream.  WOC RN consulted.  Continue current plan of care.  Follow by outpatient physician as PTA  Hx GIB, history of hematochezia August 2019 with colonoscopy revealing several ulcers and polyps.  Refer to GI 05/2018  Schizoaffective disorder, bipolar type  Neuropathy  Chronic constipation -on MiraLAX.  Patient denies lactulose at home  Contact precautions for [facility] day # 12   Rosalin Hawking, MD PhD Stroke Neurology 09/11/2018 12:58 PM     To contact Stroke Continuity provider, please refer to [email or web address] After hours, contact General Neurology

## 2018-09-11 NOTE — Progress Notes (Signed)
Nutrition Follow-up  DOCUMENTATION CODES:   Obesity unspecified  INTERVENTION:  Provide Glucerna Shake po TID, each supplement provides 220 kcal and 10 grams of protein.  Encourage adequate PO intake.   NUTRITION DIAGNOSIS:   Inadequate oral intake related to poor appetite as evidenced by meal completion < 50%; ongoing  GOAL:   Patient will meet greater than or equal to 90% of their needs; progressing  MONITOR:   PO intake, Supplement acceptance, Weight trends, I & O's, Skin, Labs  REASON FOR ASSESSMENT:   Malnutrition Screening Tool    ASSESSMENT:   48 y.o. female  With PMH of PE ( 10/19 on xarelto), CVA ( 2016; residual left side weakness), HTN, DM HLD, COPD who presented to [facility] ED with c/o left arm numbness and facial droop.  Pt refused breakfast this AM. Glucerna shake has been ordered and pt has been consuming them. RD to increase Glucerna to TID to aid in adequate caloric and protein needs. Pt encouraged to eat her food at meals and to drink her supplements. Labs and medications reviewed.   Diet Order:   Diet Order            Diet heart healthy/carb modified Room service appropriate? Yes; Fluid consistency: Thin  Diet effective now              EDUCATION NEEDS:   Not appropriate for education at this time  Skin:  Skin Assessment: Reviewed RN Assessment  Last BM:  1/12  Height:   Ht Readings from Last 1 Encounters:  09/01/18 5\' 1"  (1.549 m)    Weight:   Wt Readings from Last 1 Encounters:  09/01/18 88.5 kg    Ideal Body Weight:  47.7 kg  BMI:  Body mass index is 36.87 kg/m.  Estimated Nutritional Needs:   Kcal:  1700-1900  Protein:  85-100 grams  Fluid:  1.7 - 1.9 L/day    Corrin Parker, MS, RD, LDN Pager # [phone] After hours/ weekend pager # [phone]

## 2018-09-12 ENCOUNTER — Inpatient Hospital Stay (HOSPITAL_COMMUNITY): Payer: Medicare Other

## 2018-09-12 DIAGNOSIS — N179 Acute kidney failure, unspecified: Secondary | ICD-10-CM

## 2018-09-12 DIAGNOSIS — R509 Fever, unspecified: Secondary | ICD-10-CM

## 2018-09-12 DIAGNOSIS — N182 Chronic kidney disease, stage 2 (mild): Secondary | ICD-10-CM

## 2018-09-12 LAB — URINALYSIS, COMPLETE (UACMP) WITH MICROSCOPIC
Bilirubin Urine: NEGATIVE
Glucose, UA: NEGATIVE mg/dL
HGB URINE DIPSTICK: NEGATIVE
Ketones, ur: NEGATIVE mg/dL
Nitrite: NEGATIVE
Protein, ur: NEGATIVE mg/dL
Specific Gravity, Urine: 1.013 (ref 1.005–1.030)
WBC, UA: >50 WBC/hpf — ABNORMAL HIGH (ref 0–5)
pH: 5 (ref 5.0–8.0)

## 2018-09-12 LAB — BASIC METABOLIC PANEL
Anion gap: 10 (ref 5–15)
BUN: 22 mg/dL — ABNORMAL HIGH (ref 6–20)
CALCIUM: 9.1 mg/dL (ref 8.9–10.3)
CO2: 23 mmol/L (ref 22–32)
Chloride: 102 mmol/L (ref 98–111)
Creatinine, Ser: 1.54 mg/dL — ABNORMAL HIGH (ref 0.44–1.00)
GFR calc Af Amer: 46 mL/min — ABNORMAL LOW (ref >60)
GFR calc non Af Amer: 40 mL/min — ABNORMAL LOW (ref >60)
Glucose, Bld: 136 mg/dL — ABNORMAL HIGH (ref 70–99)
Potassium: 4.1 mmol/L (ref 3.5–5.1)
Sodium: 135 mmol/L (ref 135–145)

## 2018-09-12 LAB — CBC
HCT: 31.4 % — ABNORMAL LOW (ref 36.0–46.0)
Hemoglobin: 10 g/dL — ABNORMAL LOW (ref 12.0–15.0)
MCH: 22.5 pg — ABNORMAL LOW (ref 26.0–34.0)
MCHC: 31.8 g/dL (ref 30.0–36.0)
MCV: 70.7 fL — ABNORMAL LOW (ref 80.0–100.0)
Platelets: 282 10*3/uL (ref 150–400)
RBC: 4.44 MIL/uL (ref 3.87–5.11)
RDW: 15 % (ref 11.5–15.5)
WBC: 3.9 10*3/uL — ABNORMAL LOW (ref 4.0–10.5)
nRBC: 0 % (ref 0.0–0.2)

## 2018-09-12 LAB — GLUCOSE, CAPILLARY
Glucose-Capillary: 122 mg/dL — ABNORMAL HIGH (ref 70–99)
Glucose-Capillary: 148 mg/dL — ABNORMAL HIGH (ref 70–99)
Glucose-Capillary: 257 mg/dL — ABNORMAL HIGH (ref 70–99)

## 2018-09-12 MED ORDER — SODIUM CHLORIDE 0.9 % IV SOLN
INTRAVENOUS | Status: DC
  Administered 2018-09-12 – 2018-09-19 (×11): via INTRAVENOUS

## 2018-09-12 NOTE — Progress Notes (Signed)
Physical Therapy Treatment Patient Details Name: Diamond Rice MRN: [id number] DOB: 10-27-1970 Today's Date: 09/12/2018    History of Present Illness Pt adm with lt sided weakness and CT showed hemorrhagic infarct arising in the left posterior lentiform nucleus. PMH:  rt CVA, lt transmet amputation 05/28/18, Sleep apnea, COPD, smoker, DM, Bipolar, HTN, anxiety. Pt with recent SNF stay from Oct - Dec 31st after transmetatarsal amputation prior to this admission.     PT Comments    Pt performed supine LE exercises.  Pt remains to present with lethargy and difficulty maintaining focus on task.  Pt limited to transfer to chair and presents with increased weakness in LLE.  Pt required active assisted ROM when performing exercises on the LLE.  Pt aware she is unable to have care needs met at home at this time.  She is agreeable to rehab at [facility].  Plan remains most appropriate for SNF placement at this time.  Frequency at this time decreased to 2x/week based on recommendations.  Informed supervising PT of need to decrease frequency.      Follow Up Recommendations  SNF     Equipment Recommendations  None recommended by PT    Recommendations for Other Services Rehab consult     Precautions / Restrictions Precautions Precautions: Fall Restrictions Weight Bearing Restrictions: No    Mobility  Bed Mobility Overal bed mobility: Needs Assistance Bed Mobility: Sit to Supine;Supine to Sit     Supine to sit: Mod assist     General bed mobility comments: Increased assistance to elevate trunk into sitting.  Pt required increased time and assistance.    Transfers Overall transfer level: Needs assistance Equipment used: Rolling walker (2 wheeled) Transfers: Sit to/from Stand Sit to Stand: Min guard         General transfer comment: Cues for hand placement to push from seated surface.  Pt is impulsive and starts to step before placing L hand on hand grip.    Ambulation/Gait Ambulation/Gait assistance: Min assist Gait Distance (Feet): (steps to recliner chair.  ) Assistive device: Rolling walker (2 wheeled) Gait Pattern/deviations: Step-to pattern;Step-through pattern;Decreased stride length;Trunk flexed;Decreased step length - left Gait velocity: decreased   General Gait Details: Cues for step length and increasing step height.  Pt with increased time and lethargic presentation.     Stairs             Wheelchair Mobility    Modified Rankin (Stroke Patients Only) Modified Rankin (Stroke Patients Only) Pre-Morbid Rankin Score: Moderate disability Modified Rankin: Moderately severe disability     Balance Overall balance assessment: Needs assistance Sitting-balance support: Single extremity supported;Feet supported Sitting balance-Leahy Scale: Fair       Standing balance-Leahy Scale: Poor Standing balance comment: heavy reliance on external surface/support                            Cognition Arousal/Alertness: Lethargic Behavior During Therapy: Flat affect Overall Cognitive Status: Impaired/Different from baseline Area of Impairment: Safety/judgement;Awareness;Problem solving                         Safety/Judgement: Decreased awareness of safety;Decreased awareness of deficits Awareness: Emergent Problem Solving: Slow processing;Decreased initiation;Difficulty sequencing;Requires verbal cues General Comments: patient states she is fatigued due to medications      Exercises General Exercises - Lower Extremity Ankle Circles/Pumps: AROM;Both;10 reps;AAROM;Supine Quad Sets: AROM;Both;10 reps;Supine Heel Slides: AROM;Both;10 reps;Supine;AAROM Hip ABduction/ADduction: AROM;Both;10 reps;Supine;AAROM  Straight Leg Raises: AAROM;AROM;Both;10 reps;Supine    General Comments        Pertinent Vitals/Pain Pain Assessment: No/denies pain    Home Living                      Prior  Function            PT Goals (current goals can now be found in the care plan section) Acute Rehab PT Goals Patient Stated Goal: To get out of bed. Potential to Achieve Goals: Good Progress towards PT goals: Progressing toward goals    Frequency    Min 2X/week      PT Plan Current plan remains appropriate    Co-evaluation              AM-PAC PT "6 Clicks" Mobility   Outcome Measure  Help needed turning from your back to your side while in a flat bed without using bedrails?: A Lot Help needed moving from lying on your back to sitting on the side of a flat bed without using bedrails?: A Lot Help needed moving to and from a bed to a chair (including a wheelchair)?: A Little Help needed standing up from a chair using your arms (e.g., wheelchair or bedside chair)?: A Little Help needed to walk in hospital room?: A Little Help needed climbing 3-5 steps with a railing? : A Little 6 Click Score: 16    End of Session Equipment Utilized During Treatment: Gait belt Activity Tolerance: Patient tolerated treatment well Patient left: in chair;with call bell/phone within reach;with chair alarm set Nurse Communication: Mobility status PT Visit Diagnosis: Hemiplegia and hemiparesis;Other abnormalities of gait and mobility (R26.89) Hemiplegia - Right/Left: Left Hemiplegia - dominant/non-dominant: Non-dominant Hemiplegia - caused by: Nontraumatic intracerebral hemorrhage     Time: 3435-6861 PT Time Calculation (min) (ACUTE ONLY): 23 min  Charges:  $Gait Training: 8-22 mins $Therapeutic Exercise: 8-22 mins                    Governor Rooks, PTA Acute Rehabilitation Services Pager [phone] Office [phone]     Kaoru Rezendes Eli Hose 09/12/2018, 5:20 PM

## 2018-09-12 NOTE — NC FL2 (Signed)
[place] LEVEL OF CARE SCREENING TOOL     IDENTIFICATION  [name] Birthdate: 1970-12-03 Sex: female Admission Date (Current Location): 08/30/2018  [facility] and [place] Number:  Herbalist and Address:  [facility] [place], [employer] [address] [place], [place] [address]      Provider Number: [id number]  Attending Physician Name and Address:  Rosalin Hawking, MD  Relative Name and Phone Number:       Current Level of Care: Hospital Recommended Level of Care: [place] Prior Approval Number:    Date Approved/Denied:   PASRR Number: Manual review  Discharge Plan: SNF    Current Diagnoses: Patient Active Problem List   Diagnosis Date Noted  . Hemorrhagic stroke ([place])   . Diastolic dysfunction   . Diabetes mellitus type 2 in obese ([place])   . Anemia of chronic disease   . Chronic obstructive pulmonary disease ([place])   . ICH (intracerebral hemorrhage) ([place]) 08/30/2018  . Hypertensive heart disease without heart failure 07/04/2018  . Nonrheumatic aortic valve insufficiency 07/04/2018  . Esophageal abnormality   . Class 2 severe obesity due to excess calories with serious comorbidity and body mass index (BMI) of 35.0 to 35.9 in adult [facility])   . Peripheral vascular disease ([place])   . History of osteomyelitis   . History of amputation of lesser toe ([place])   . Pulmonary embolism ([place]) 06/22/2018  . Microcytic anemia 06/17/2018  . At risk for adverse drug reaction 06/11/2018  . Critical lower limb ischemia 05/27/2018  . GI bleeding 04/02/2018  . Acute renal failure (ARF) ([place]) 04/02/2018  . Hypokalemia 04/02/2018  . Polysubstance abuse ([place]) 04/02/2018  . Diabetic foot ulcer ([place]) 04/02/2018  . Cannabis use disorder, moderate, dependence ([place]) 05/19/2017  . Cocaine use disorder, moderate, dependence ([place]) 05/19/2017  . Schizoaffective disorder, bipolar type ([place]) 10/07/2015  . OSA (obstructive sleep apnea)  06/09/2015  . Vitamin D deficiency 01/06/2015  . Bilateral knee pain 01/05/2015  . Numbness and tingling in right hand 01/05/2015  . Insomnia 12/07/2014  . Right-sided lacunar infarction ([place]) 09/15/2014  . Left hemiparesis ([place]) 09/15/2014  . Dyspnea   . Back pain 09/11/2013  . Psychoactive substance-induced organic mood disorder ([place]) 05/28/2013  . Cocaine abuse with cocaine-induced mood disorder ([place]) 05/28/2013  . Cannabis dependence with cannabis-induced anxiety disorder ([place]) 05/28/2013  . Morbid obesity with BMI of 45.0-49.9, adult ([place]) 04/25/2012  . Chest pain 04/17/2012  . Hypertension 04/17/2012  . DM (diabetes mellitus) with peripheral vascular complication ([place]) 15/40/0867  . Hyperlipidemia 04/17/2012  . Tobacco use 04/17/2012    Orientation RESPIRATION BLADDER Height & Weight     Self, Time, Situation, Place  Normal Incontinent Weight: 195 lb 1.7 oz (88.5 kg) Height:  5\' 1"  (154.9 cm)  BEHAVIORAL SYMPTOMS/MOOD NEUROLOGICAL BOWEL NUTRITION STATUS      Incontinent Diet(see DC summary)  AMBULATORY STATUS COMMUNICATION OF NEEDS Skin   Limited Assist Verbally Surgical wounds(closed right foot wound, gauze dressing changed daily)                       Personal Care Assistance Level of Assistance  Bathing, Feeding, Dressing Bathing Assistance: Limited assistance Feeding assistance: Independent Dressing Assistance: Limited assistance     Functional Limitations Info  Sight, Hearing, Speech Sight Info: Adequate Hearing Info: Adequate Speech Info: Adequate    SPECIAL CARE FACTORS FREQUENCY  PT (By licensed PT), OT (By licensed OT)  PT Frequency: 5x/wk OT Frequency: 5x/wk            Contractures Contractures Info: Not present    Additional Factors Info  Code Status, Allergies, Psychotropic, Insulin Sliding Scale Code Status Info: Full Allergies Info: Morphine And Related, Metformin Psychotropic Info: Abilify 15mg  daily at bed Insulin Sliding  Scale Info: 0-15 units 3x/day with meals; 0-5 units daily at bed; Levemir 15 units 2x/day       Current Medications (09/12/2018):  This is the current hospital active medication list Current Facility-Administered Medications  Medication Dose Route Frequency Provider Last Rate Last Dose  .  stroke: mapping our early stages of recovery book   Does not apply Once Sethi, Pramod S, MD      . 0.9 %  sodium chloride infusion   Intravenous Continuous Rosalin Hawking, MD 75 mL/hr at 09/12/18 1404    . acetaminophen (TYLENOL) tablet 650 mg  650 mg Oral Q4H PRN Garvin Fila, MD   650 mg at 09/11/18 1953   Or  . acetaminophen (TYLENOL) solution 650 mg  650 mg Per Tube Q4H PRN Garvin Fila, MD       Or  . acetaminophen (TYLENOL) suppository 650 mg  650 mg Rectal Q4H PRN Garvin Fila, MD      . ARIPiprazole (ABILIFY) tablet 15 mg  15 mg Oral QHS Garvin Fila, MD   15 mg at 09/11/18 2307  . aspirin EC tablet 81 mg  81 mg Oral Daily Burnetta Sabin L, NP   81 mg at 09/12/18 1002  . cloNIDine (CATAPRES) tablet 0.3 mg  0.3 mg Oral TID Garvin Fila, MD   0.3 mg at 09/12/18 1002  . feeding supplement (GLUCERNA SHAKE) (GLUCERNA SHAKE) liquid 237 mL  237 mL Oral TID BM Rosalin Hawking, MD   237 mL at 09/12/18 1443  . ferrous sulfate tablet 325 mg  325 mg Oral Q breakfast Garvin Fila, MD   325 mg at 09/12/18 1000  . furosemide (LASIX) tablet 40 mg  40 mg Oral Daily Garvin Fila, MD   40 mg at 09/12/18 1002  . gabapentin (NEURONTIN) capsule 100 mg  100 mg Oral BID Rosalin Hawking, MD   100 mg at 09/12/18 1402  . hydrALAZINE (APRESOLINE) injection 20 mg  20 mg Intravenous Q8H PRN Garvin Fila, MD   20 mg at 08/31/18 1323  . hydrALAZINE (APRESOLINE) tablet 50 mg  50 mg Oral QID Garvin Fila, MD   50 mg at 09/12/18 1403  . insulin aspart (novoLOG) injection 0-15 Units  0-15 Units Subcutaneous TID WC Garvin Fila, MD   2 Units at 09/12/18 [phone]  . insulin aspart (novoLOG) injection 0-5 Units  0-5 Units  Subcutaneous QHS Garvin Fila, MD   2 Units at 09/10/18 2218  . insulin detemir (LEVEMIR) injection 15 Units  15 Units Subcutaneous BID Garvin Fila, MD   15 Units at 09/12/18 1002  . labetalol (NORMODYNE,TRANDATE) injection 20 mg  20 mg Intravenous Q2H PRN Garvin Fila, MD   20 mg at 08/31/18 1400  . lisinopril (PRINIVIL,ZESTRIL) tablet 30 mg  30 mg Oral Daily Garvin Fila, MD   30 mg at 09/12/18 1001  . Melatonin TABS 3 mg  3 mg Oral QHS Garvin Fila, MD   3 mg at 09/11/18 2309  . metoprolol tartrate (LOPRESSOR) tablet 25 mg  25 mg Oral BID Garvin Fila, MD   25 mg at  09/12/18 1002  . MUSCLE RUB CREA   Topical Q6H PRN Garvin Fila, MD      . pantoprazole (PROTONIX) EC tablet 40 mg  40 mg Oral QHS Garvin Fila, MD   40 mg at 09/11/18 2308  . pentoxifylline (TRENTAL) CR tablet 400 mg  400 mg Oral TID WC Garvin Fila, MD   400 mg at 09/12/18 1403  . polyethylene glycol (MIRALAX / GLYCOLAX) packet 17 g  17 g Oral Daily PRN Rosalin Hawking, MD   17 g at 09/10/18 1051  . potassium chloride SA (K-DUR,KLOR-CON) CR tablet 20 mEq  20 mEq Oral Daily Garvin Fila, MD   20 mEq at 09/12/18 1001  . silver sulfADIAZINE (SILVADENE) 1 % cream   Topical Daily Garvin Fila, MD         Discharge Medications: Please see discharge summary for a list of discharge medications.  Relevant Imaging Results:  Relevant Lab Results:   Additional Information SS#: [id number]  Geralynn Ochs, LCSW

## 2018-09-12 NOTE — Progress Notes (Addendum)
STROKE TEAM PROGRESS NOTE   SUBJECTIVE (INTERVAL HISTORY)  Nursing tech is at bedside.  Patient awake alert and no appetite, not eating well. She had spike fever overnight, and Cre elevated. Put on IVF and do infection work up.   OBJECTIVE Vitals:   09/12/18 0434 09/12/18 0730 09/12/18 0945 09/12/18 1155  BP: 114/82 131/73 123/79 (!) 140/92  Pulse: 66 63 63 68  Resp: 20 16 18    Temp: 98.8 F (37.1 C) 98.7 F (37.1 C) 98.1 F (36.7 C) 98.7 F (37.1 C)  TempSrc: Oral Oral Oral Oral  SpO2: 97% 100% 100%   Weight:      Height:        CBC:  Recent Labs  Lab 09/09/18 0527 09/12/18 0535  WBC 7.4 3.9*  HGB 9.8* 10.0*  HCT 31.3* 31.4*  MCV 71.8* 70.7*  PLT 357 683    Basic Metabolic Panel:  Recent Labs  Lab 09/09/18 0527 09/12/18 0535  NA 136 135  K 3.7 4.1  CL 103 102  CO2 26 23  GLUCOSE 134* 136*  BUN 18 22*  CREATININE 1.20* 1.54*  CALCIUM 8.9 9.1    PHYSICAL EXAM - no change since yesterday General - Well nourished, well developed, in no apparent distress.  Cardiovascular - Regular rate and rhythm.  Mental Status -  Level of arousal and orientation to time, place, and person were intact. Language including expression, naming, repetition, comprehension was assessed and found intact.  Mild dysarthria  Cranial Nerves II - XII - II - Visual field intact OU. III, IV, VI - Extraocular movements intact. V - Facial sensation decreased on the left VII - left facial droop VIII - Hearing & vestibular intact bilaterally. X - Palate elevates symmetrically, mild dysarthria. XI - Chin turning & shoulder shrug intact bilaterally. XII - Tongue protrusion intact.  Motor Strength - The patient's strength was normal in right upper and lower extremities, however left upper extremity is 3-/5 and left lower extremity 2/5, left fourth and fifth digit amputation.  Bulk was normal and fasciculations were absent.   Motor Tone - Muscle tone was assessed at the neck and appendages  and was normal.  Reflexes - The patient's reflexes were symmetrical in all extremities and she had no pathological reflexes.  Sensory - Light touch, temperature/pinprick were assessed and were decreased on the left  Coordination - The patient had normal movements in the right hand with no ataxia or dysmetria.  Tremor was absent.  Gait and Station - deferred.   ASSESSMENT/PLAN [name] is a 48 y.o. female with history of PE (10/19 on xarelto), CVA ( 2016; residual left side weakness), HTN, ASPVD, DM HLD, COPD  presenting with left arm numbness and facial droop. She did not receive IV t-PA due to [place].  ICH - right posterior BG/PLIC hemorrhage likely due to uncontrolled hypertension   CT head 1/30 1330 - ICH in the right  posterior lentiform nucleus - multiple old infarcts  CT head 1/30 2024 - no change  MRI/MRA head and neck unchanged R BG hmg. 10-20 scattered microhemorrhages.   Bilateral lower extremity venous dopplers - negative for DVT  2D Echo - 06/24/18  - EF 65 - 70%. No cardiac source of emboli identified.   LDL  104  HgbA1c 7.0  VTE prophylaxis - SCDs   clopidogrel 75 mg daily, Xarelto (rivaroxaban) daily but recently ran out of medications prior to admission, added aspirin 81 mg daily today. Not a long-term AC  candidate    Therapy recommendations:  SNF   Disposition:  pending  Spiking fever  Tmax 102.3->100.8  UA pending  Urine culture pending  Blood culture pending  CXR pending  Flu PCR pending  AKI on CKD stage II  Cre 1.26->1.20->1.54  Decreased appetite   Encourage po intake  Start IVF @ 75  BMP monitoring  PE  Diagnosed 05/2018 - provoked event as she had undergone a left 3-5 digit toe amputation on 10/1 with only physical activity being PT, otherwise she does not walk.  Planned tx x 3 months with AC then change to aspirin and plavix  On xarleto, but ran out and did not refill dt lack of money  No longer an [facility]  candidate secondary to [place].  Hypertensive emergency  Blood pressure goal less than 160   Home medications: Catapres 0.3 3 times daily, hydralazine 50 mg 4 times daily, lisinopril 30 mg a day, metoprolol 25 mg a day  Home meds all resumed in hospital  BP stable . Long-term BP goal normotensive  Hx of stroke  01/736 small R PLIC infarct d/t SVD. Started on aspirin 325. D/c to CIR. HTN. HLD, DB, THC use, ETOH use, morbid obesity, OSA - noncompliant with medications  Hyperlipidemia, LDL 104, on no statin PTA, now on no statin given hemorrhage  Diabetes  HgbA1c 7.0, goal < 7.0 - much improved. 2 yrs previous 12.3  Under management. Appears controlled   SSI  CBG monitoring  Other Stroke Risk Factors  Former cigarette smoker - quit  Substance abuse - THC  Obesity, Body mass index is 36.87 kg/m., recommend weight loss, diet and exercise as appropriate   Family hx stroke (father - brothers)  Migraines  Obstructive sleep apnea, on CPAP at home- compliant - using as IP  Other Active Problems  Microcytic anemia 9.8->10.0  Substance abuse history  Hx Left foot lesser toe amputation using Silvadene cream.  WOC RN consulted.  Continue current plan of care.  Follow by outpatient physician as PTA  Hx GIB, history of hematochezia August 2019 with colonoscopy revealing several ulcers and polyps.  Refer to GI 05/2018  Schizoaffective disorder, bipolar type  Neuropathy  Chronic constipation - on MiraLAX.  Patient denies lactulose at home  Contact precautions for [facility] day # 13   Diamond Hawking, MD PhD Stroke Neurology 09/12/2018 11:58 AM  I spent  35 minutes in total face-to-face time with the patient, more than 50% of which was spent in counseling and coordination of care, reviewing test results, images and medication, and discussing the diagnosis of spiking fever, AKI on CKD, ICH, DM and HTN, treatment plan and potential prognosis. This patient's care  requiresreview of multiple databases, neurological assessment, discussion with family, other specialists and medical decision making of high complexity.        To contact Stroke Continuity provider, please refer to [email or web address] After hours, contact General Neurology

## 2018-09-12 NOTE — Progress Notes (Signed)
  Speech Language Pathology Treatment: Cognitive-Linquistic  Patient Details Name: Diamond Rice MRN: [id number] DOB: 12/15/1970 Today's Date: 09/12/2018 Time: 8850-2774 SLP Time Calculation (min) (ACUTE ONLY): 27 min  Assessment / Plan / Recommendation Clinical Impression  Pt was sen in her room for treatment. She was alert throughout the session but exhibited difficulty maintaining alertness when short breaks were provided. She completed labial and lingual exercises with models and moderate cues. She required moderate cues to use compensatory strategies for speech intelligibility at the short phrase level but did report that she believed her speech was improved when they were used. Pt demonstrated 100% accuracy with working memory and 4-item immediate recall. She achieved 50% accuracy with problem solving tasks increasing to 100% accuracy with moderate verbal cues for reasoning.    HPI HPI: Pt adm with lt sided weakness and CT showed hemorrhagic infarct arising in the left posterior lentiform nucleus. PMH:  rt CVA, lt transmet amputation 05/28/18, Sleep apnea, COPD, smoker, DM, Bipolar, HTN, anxiety.      SLP Plan  Continue with current plan of care;Goals updated       Recommendations   Continue treatment for cognition and dysarthria.                 Oral Care Recommendations: Oral care BID Follow up Recommendations: Skilled Nursing facility Plan: Continue with current plan of care;Goals updated            Folasade Mooty I. Hardin Negus, [place], [place] Office number [phone] Pager [place] 09/12/2018, 11:10 AM

## 2018-09-12 NOTE — Plan of Care (Signed)
Progressing towards goals

## 2018-09-13 LAB — GLUCOSE, CAPILLARY
Glucose-Capillary: 205 mg/dL — ABNORMAL HIGH (ref 70–99)
Glucose-Capillary: 216 mg/dL — ABNORMAL HIGH (ref 70–99)
Glucose-Capillary: 122 mg/dL — ABNORMAL HIGH (ref 70–99)
Glucose-Capillary: 149 mg/dL — ABNORMAL HIGH (ref 70–99)

## 2018-09-13 LAB — CBC
HEMATOCRIT: 30.5 % — AB (ref 36.0–46.0)
Hemoglobin: 9.4 g/dL — ABNORMAL LOW (ref 12.0–15.0)
MCH: 21.9 pg — ABNORMAL LOW (ref 26.0–34.0)
MCHC: 30.8 g/dL (ref 30.0–36.0)
MCV: 70.9 fL — ABNORMAL LOW (ref 80.0–100.0)
Platelets: 280 10*3/uL (ref 150–400)
RBC: 4.3 MIL/uL (ref 3.87–5.11)
RDW: 15.1 % (ref 11.5–15.5)
WBC: 4.7 10*3/uL (ref 4.0–10.5)
nRBC: 0 % (ref 0.0–0.2)

## 2018-09-13 LAB — BASIC METABOLIC PANEL
Anion gap: 10 (ref 5–15)
BUN: 19 mg/dL (ref 6–20)
CO2: 23 mmol/L (ref 22–32)
Calcium: 8.7 mg/dL — ABNORMAL LOW (ref 8.9–10.3)
Chloride: 102 mmol/L (ref 98–111)
Creatinine, Ser: 1.39 mg/dL — ABNORMAL HIGH (ref 0.44–1.00)
GFR calc Af Amer: 53 mL/min — ABNORMAL LOW (ref >60)
GFR calc non Af Amer: 45 mL/min — ABNORMAL LOW (ref >60)
Glucose, Bld: 220 mg/dL — ABNORMAL HIGH (ref 70–99)
Potassium: 3.6 mmol/L (ref 3.5–5.1)
Sodium: 135 mmol/L (ref 135–145)

## 2018-09-13 MED ORDER — BISACODYL 10 MG RE SUPP
10.0000 mg | Freq: Every day | RECTAL | Status: DC | PRN
  Filled 2018-09-13: qty 1

## 2018-09-13 MED ORDER — FLEET ENEMA 7-19 GM/118ML RE ENEM: 1.0000 | ENEMA | Freq: Every day | RECTAL | Status: DC | PRN

## 2018-09-13 MED ORDER — SENNOSIDES-DOCUSATE SODIUM 8.6-50 MG PO TABS
1.0000 | ORAL_TABLET | Freq: Every day | ORAL | Status: DC
  Administered 2018-09-13 – 2018-09-20 (×8): 1 via ORAL
  Filled 2018-09-13 (×8): qty 1

## 2018-09-13 NOTE — Progress Notes (Addendum)
STROKE TEAM PROGRESS NOTE   SUBJECTIVE (INTERVAL HISTORY)  Patient reports no BM in several days. Will give laxatives. Low grade temp resolved. UA positive but cx without growth,   OBJECTIVE Vitals:   09/12/18 1715 09/12/18 2048 09/13/18 0448 09/13/18 0731  BP: 135/79 126/77 133/82 129/81  Pulse: 67 66 65 66  Resp: 17   18  Temp: 98.2 F (36.8 C) 99.1 F (37.3 C) 97.9 F (36.6 C) 98.4 F (36.9 C)  TempSrc: Oral Oral Oral Oral  SpO2: 100%  100% 99%  Weight:      Height:        CBC:  Recent Labs  Lab 09/12/18 0535 09/13/18 0620  WBC 3.9* 4.7  HGB 10.0* 9.4*  HCT 31.4* 30.5*  MCV 70.7* 70.9*  PLT 282 295    Basic Metabolic Panel:  Recent Labs  Lab 09/12/18 0535 09/13/18 0620  NA 135 135  K 4.1 3.6  CL 102 102  CO2 23 23  GLUCOSE 136* 220*  BUN 22* 19  CREATININE 1.54* 1.39*  CALCIUM 9.1 8.7*    PHYSICAL EXAM - unchanged General - Well nourished, well developed, in no apparent distress.  Cardiovascular - Regular rate and rhythm.  Mental Status -  Level of arousal and orientation to time, place, and person were intact. Language including expression, naming, repetition, comprehension was assessed and found intact.  Mild dysarthria  Cranial Nerves II - XII - II - Visual field intact OU. III, IV, VI - Extraocular movements intact. V - Facial sensation decreased on the left VII - left facial droop VIII - Hearing & vestibular intact bilaterally. X - Palate elevates symmetrically, mild dysarthria. XI - Chin turning & shoulder shrug intact bilaterally. XII - Tongue protrusion intact.  Motor Strength - The patient's strength was normal in right upper and lower extremities, however left upper extremity is 3-/5 and left lower extremity 2/5, left fourth and fifth digit amputation.  Bulk was normal and fasciculations were absent.   Motor Tone - Muscle tone was assessed at the neck and appendages and was normal.  Sensory - Light touch, temperature/pinprick were  assessed and were decreased on the left  Coordination - The patient had normal movements in the right hand with no ataxia or dysmetria.  Tremor was absent.  Gait and Station - deferred.   ASSESSMENT/PLAN [name] is a 48 y.o. female with history of PE (10/19 on xarelto), CVA ( 2016; residual left side weakness), HTN, ASPVD, DM HLD, COPD  presenting with left arm numbness and facial droop. She did not receive IV t-PA due to [place].  ICH - right posterior BG/PLIC hemorrhage likely due to uncontrolled hypertension   CT head 1/30 1330 - ICH in the right  posterior lentiform nucleus - multiple old infarcts  CT head 1/30 2024 - no change  MRI/MRA head and neck unchanged R BG hmg. 10-20 scattered microhemorrhages.   Bilateral lower extremity venous dopplers - negative for DVT  2D Echo - 06/24/18  - EF 65 - 70%. No cardiac source of emboli identified.   LDL  104  HgbA1c 7.0  VTE prophylaxis - SCDs   clopidogrel 75 mg daily, Xarelto (rivaroxaban) daily but recently ran out of medications prior to admission, added aspirin 81 mg daily today. Not a long-term AC candidate    Therapy recommendations:  SNF   Disposition:  Pending - medically ready for d/c. SW getting MCD reinstated and looking for facility.  Spiking fever  Tmax 102.3->100.8  UA lg LE, no nitrated, > 50 WBC, many bacteria  Urine culture reincubated for better growth after 1 day  Blood culture no growth after 24h  CXR scaring L mid lung, no edema or consolidation, stable prominence  Flu PCR positive MRSA (unchanged)  AKI on CKD stage II  Cre 1.39 (baseline 1.2-1.25)  Decreased appetite   Encourage po intake  Start IVF @ 75  BMP monitoring  PE  Diagnosed 05/2018 - provoked event as she had undergone a left 3-5 digit toe amputation on 10/1 with only physical activity being PT, otherwise she does not walk.  Planned tx x 3 months with AC then change to aspirin and plavix  On xarleto, but ran  out and did not refill dt lack of money  No longer an [facility] candidate secondary to [place].  Hypertensive emergency  Blood pressure goal less than 160   Home medications: Catapres 0.3 3 times daily, hydralazine 50 mg 4 times daily, lisinopril 30 mg a day, metoprolol 25 mg a day  Home meds all resumed in hospital  BP stable . Long-term BP goal normotensive  Hx of stroke  0/0174 small R PLIC infarct d/t SVD. Started on aspirin 325. D/c to CIR. HTN. HLD, DB, THC use, ETOH use, morbid obesity, OSA - noncompliant with medications  Hyperlipidemia, LDL 104, on no statin PTA, now on no statin given hemorrhage  Diabetes  HgbA1c 7.0, goal < 7.0 - much improved. 2 yrs previous 12.3  Under management. Appears controlled   SSI  CBG monitoring  Other Stroke Risk Factors  Former cigarette smoker - quit  Substance abuse - THC  Obesity, Body mass index is 36.87 kg/m., recommend weight loss, diet and exercise as appropriate   Family hx stroke (father - brothers)  Migraines  Obstructive sleep apnea, on CPAP at home- compliant - using as IP  Other Active Problems  Microcytic anemia 9.8->10.0->9.4  Substance abuse history  Hx Left foot lesser toe amputation using Silvadene cream.  WOC RN consulted.  Continue current plan of care.  Follow by outpatient physician as PTA  Hx GIB, history of hematochezia August 2019 with colonoscopy revealing several ulcers and polyps.  Refer to GI 05/2018  Schizoaffective disorder, bipolar type  Neuropathy  Chronic constipation - on MiraLAX.  Patient denies lactulose at home. Added senokot q hs and prn bisacodyl and fleets.   Contact precautions for MRSA  Hospital day # [place], MSN, APRN, ANVP-BC, AGPCNP-BC Advanced Practice Stroke Nurse [place] for Schedule & Pager information 09/13/2018 2:51 PM   ATTENDING NOTE: I reviewed above note and agree with the assessment and plan. Pt was seen and examined.    No acute event overnight.  Patient afebrile, creatinine improved after IV fluid.  UA WBC more than 50.  Urine culture pending, blood culture no growth to date.  Given patient asymptomatic, afebrile, hold off antibiotic at this time.  Will await urine culture.  Creatinine 1.39, down from 1.54 yesterday, still not at baseline 1.2.  We will continue IV fluids for 1 more day.  Social worker is working on placement, hopefully DC early next week.  Rosalin Hawking, MD PhD Stroke Neurology 09/13/2018 6:51 PM      To contact Stroke Continuity provider, please refer to [email or web address] After hours, contact General Neurology

## 2018-09-13 NOTE — Progress Notes (Signed)
Occupational Therapy Treatment Patient Details Name: Diamond Rice MRN: [id number] DOB: 06/03/1971 Today's Date: 09/13/2018    History of present illness Pt adm with lt sided weakness and CT showed hemorrhagic infarct arising in the left posterior lentiform nucleus. PMH:  rt CVA, lt transmet amputation 05/28/18, Sleep apnea, COPD, smoker, DM, Bipolar, HTN, anxiety. Pt with recent SNF stay from Oct - Dec 31st after transmetatarsal amputation prior to this admission.    OT comments  Pt still needing overall mod assist for toileting and dressing tasks sit to stand.  Min facilitation for bed mobility and functional transfers using the RW.  Still with hemiparesis in the LUE and LLE however strength and use with functional movement was greater than what she demonstrated when asked to close her hand or lift her leg up off of the floor.  Pt with onl 20% grasp when asked but able to hold onto walker during transfer without difficulty.  Will continue with acute OT until discharge to SNF.  Follow Up Recommendations  SNF;Supervision/Assistance - 24 hour    Equipment Recommendations  None recommended by OT       Precautions / Restrictions Precautions Precautions: Fall Precaution Comments: left hemiparesis Restrictions Weight Bearing Restrictions: No       Mobility Bed Mobility Overal bed mobility: Needs Assistance Bed Mobility: Rolling Rolling: Min assist Sidelying to sit: Min assist          Transfers Overall transfer level: Needs assistance Equipment used: Rolling walker (2 wheeled) Transfers: Sit to/from Stand Sit to Stand: Min assist Stand pivot transfers: Min assist       General transfer comment: Pt needs cueing for hand placement with sit to stand.      Balance Overall balance assessment: Needs assistance   Sitting balance-Leahy Scale: Poor Sitting balance - Comments: LOB to the left when sitting EOB and attempting to answer her cell phone   Standing balance support:  Bilateral upper extremity supported;During functional activity Standing balance-Leahy Scale: Poor Standing balance comment: Pt needs BUE use and RW for support with transfers and sit to stand.                           ADL either performed or assessed with clinical judgement   ADL Overall ADL's : Needs assistance/impaired                     Lower Body Dressing: Maximal assistance;Sit to/from stand Lower Body Dressing Details (indicate cue type and reason): For donning incontinence brief and gripper socks     Toileting- Clothing Manipulation and Hygiene: Moderate assistance;Sit to/from stand       Functional mobility during ADLs: Rolling walker General ADL Comments: Pt with noted greater movement in the LUE and LLE during functional tasks like sidelying to sit and for standing and pivoting with the RW then what she demonstrated when asked to move the arm or squeeze the fingers.  She was unable to lift the LLE off the floor when asked to place her foot in the brief, however she completed sit to stand with min assist and no knee buckling with standing or stand step transfer to the bedside recliner.                Cognition Arousal/Alertness: Lethargic;Awake/alert Behavior During Therapy: Flat affect  Problem Solving: Slow processing General Comments: Pt awake at beginning of session but falling asleep in the bedside chair.  She reports just having medication that makes her sleepy.                     Pertinent Vitals/ Pain       Pain Assessment: No/denies pain         Frequency  Min 2X/week        Progress Toward Goals  OT Goals(current goals can now be found in the care plan section)  Progress towards OT goals: Progressing toward goals     Plan Discharge plan remains appropriate       AM-PAC OT "6 Clicks" Daily Activity     Outcome Measure   Help from another person eating meals?: A  Little Help from another person taking care of personal grooming?: A Little Help from another person toileting, which includes using toliet, bedpan, or urinal?: A Little Help from another person bathing (including washing, rinsing, drying)?: A Lot Help from another person to put on and taking off regular upper body clothing?: A Little Help from another person to put on and taking off regular lower body clothing?: A Lot 6 Click Score: 16    End of Session Equipment Utilized During Treatment: Rolling walker  OT Visit Diagnosis: Unsteadiness on feet (R26.81);Muscle weakness (generalized) (M62.81);Hemiplegia and hemiparesis Hemiplegia - Right/Left: Left Hemiplegia - dominant/non-dominant: Non-Dominant Hemiplegia - caused by: Cerebral infarction   Activity Tolerance Patient tolerated treatment well   Patient Left in chair;with call bell/phone within reach;with chair alarm set   Nurse Communication Mobility status        Time: 4166-0630 OT Time Calculation (min): 34 min  Charges: OT General Charges $OT Visit: 1 Visit OT Treatments $Self Care/Home Management : 23-37 mins    Lawrnce Reyez OTR/L 09/13/2018, 1:43 PM

## 2018-09-13 NOTE — Progress Notes (Signed)
CSW following for discharge plan. Patient has received denials from [facility] and [facility] due to substance abuse history. CSW reached out to Admissions at [facility] to review referral.   CSW sent information to [facility] for patient's PASRR review.   CSW to follow.  Laveda Abbe,  Clinical Social Worker [phone]

## 2018-09-13 NOTE — Progress Notes (Signed)
Pt placed on CPAP and tolerating well at this time.  RT will continue to monitor.  

## 2018-09-13 NOTE — Progress Notes (Signed)
CSW received update that patient's Medicaid application has been submitted to DSS, so bed search for LOG placement can now begin. CSW completed FL2 and faxed out referral to facilities who will accept LOG, awaiting response. Patient also has manual review PASRR.   CSW to follow.  Laveda Abbe, [place] Clinical Social Worker [phone]

## 2018-09-14 LAB — CBC
HCT: 29.2 % — ABNORMAL LOW (ref 36.0–46.0)
Hemoglobin: 8.9 g/dL — ABNORMAL LOW (ref 12.0–15.0)
MCH: 21.8 pg — ABNORMAL LOW (ref 26.0–34.0)
MCHC: 30.5 g/dL (ref 30.0–36.0)
MCV: 71.4 fL — ABNORMAL LOW (ref 80.0–100.0)
PLATELETS: 280 10*3/uL (ref 150–400)
RBC: 4.09 MIL/uL (ref 3.87–5.11)
RDW: 15.1 % (ref 11.5–15.5)
WBC: 5 10*3/uL (ref 4.0–10.5)
nRBC: 0 % (ref 0.0–0.2)

## 2018-09-14 LAB — BASIC METABOLIC PANEL
Anion gap: 8 (ref 5–15)
BUN: 16 mg/dL (ref 6–20)
CO2: 26 mmol/L (ref 22–32)
Calcium: 8.8 mg/dL — ABNORMAL LOW (ref 8.9–10.3)
Chloride: 104 mmol/L (ref 98–111)
Creatinine, Ser: 1.09 mg/dL — ABNORMAL HIGH (ref 0.44–1.00)
GFR calc Af Amer: >60 mL/min (ref >60)
GFR calc non Af Amer: >60 mL/min (ref >60)
Glucose, Bld: 95 mg/dL (ref 70–99)
Potassium: 3.9 mmol/L (ref 3.5–5.1)
Sodium: 138 mmol/L (ref 135–145)

## 2018-09-14 LAB — GLUCOSE, CAPILLARY
Glucose-Capillary: 146 mg/dL — ABNORMAL HIGH (ref 70–99)
Glucose-Capillary: 93 mg/dL (ref 70–99)
Glucose-Capillary: 186 mg/dL — ABNORMAL HIGH (ref 70–99)
Glucose-Capillary: 184 mg/dL — ABNORMAL HIGH (ref 70–99)

## 2018-09-14 NOTE — Progress Notes (Signed)
STROKE TEAM PROGRESS NOTE   SUBJECTIVE (INTERVAL HISTORY)  Patient reports no new events, stable, sitting in chair.  OBJECTIVE Vitals:   09/14/18 0425 09/14/18 0749 09/14/18 0833 09/14/18 1122  BP: 138/88 (!) 147/109 (!) 151/94 122/72  Pulse: 60 62 61 62  Resp: 18 18 18 16   Temp: (!) 97.5 F (36.4 C) 98 F (36.7 C) 98 F (36.7 C) 98 F (36.7 C)  TempSrc: Oral Oral Oral Axillary  SpO2: 100% 100% 100% 100%  Weight:      Height:        CBC:  Recent Labs  Lab 09/13/18 0620 09/14/18 0626  WBC 4.7 5.0  HGB 9.4* 8.9*  HCT 30.5* 29.2*  MCV 70.9* 71.4*  PLT 280 876    Basic Metabolic Panel:  Recent Labs  Lab 09/13/18 0620 09/14/18 0626  NA 135 138  K 3.6 3.9  CL 102 104  CO2 23 26  GLUCOSE 220* 95  BUN 19 16  CREATININE 1.39* 1.09*  CALCIUM 8.7* 8.8*    PHYSICAL EXAM - Stable physical and neurologic exam General - Well nourished, well developed, in no apparent distress.  Cardiovascular - Regular rate and rhythm.  Mental Status -  Level of arousal and orientation to time, place, and person were intact. Language including expression, naming, repetition, comprehension was assessed and found intact.  Mild dysarthria  Cranial Nerves II - XII - II - Visual field intact OU. III, IV, VI - Extraocular movements intact. V - Facial sensation decreased on the left VII - left facial droop VIII - Hearing & vestibular intact bilaterally. X - Palate elevates symmetrically, mild dysarthria. XI - Chin turning & shoulder shrug intact bilaterally. XII - Tongue protrusion intact.  Motor Strength - The patient's strength was normal in right upper and lower extremities, however left upper extremity is 3-/5 and left lower extremity 2/5, left fourth and fifth digit amputation.  Bulk was normal and fasciculations were absent.   Motor Tone - Muscle tone was assessed at the neck and appendages and was normal.  Sensory - Light touch, temperature/pinprick were assessed and were  decreased on the left  Coordination - The patient had normal movements in the right hand with no ataxia or dysmetria.  Tremor was absent.  Gait and Station - deferred.   ASSESSMENT/PLAN [name] is a 48 y.o. female with history of PE (10/19 on xarelto), CVA ( 2016; residual left side weakness), HTN, ASPVD, DM HLD, COPD  presenting with left arm numbness and facial droop. She did not receive IV t-PA due to [place].  ICH - right posterior BG/PLIC hemorrhage likely due to uncontrolled hypertension   CT head 1/30 1330 - ICH in the right  posterior lentiform nucleus - multiple old infarcts  CT head 1/30 2024 - no change  MRI/MRA head and neck unchanged R BG hmg. 10-20 scattered microhemorrhages.   Bilateral lower extremity venous dopplers - negative for DVT  2D Echo - 06/24/18  - EF 65 - 70%. No cardiac source of emboli identified.   LDL  104  HgbA1c 7.0  VTE prophylaxis - SCDs   clopidogrel 75 mg daily, Xarelto (rivaroxaban) daily but recently ran out of medications prior to admission, added aspirin 81 mg daily today. Not a long-term AC candidate    Therapy recommendations:  SNF   Disposition:  Pending - medically ready for d/c. SW getting MCD reinstated and looking for facility.  Spiking fever  Tmax 102.3->100.8  UA lg LE, no  nitrated, > 50 WBC, many bacteria  Urine culture reincubated for better growth after 1 day  Blood culture no growth after 24h  CXR scaring L mid lung, no edema or consolidation, stable prominence  Flu PCR positive MRSA (unchanged)  AKI on CKD stage II  Cre 1.39 (baseline 1.2-1.25)  Decreased appetite   Encourage po intake  Start IVF @ 75  BMP monitoring  PE  Diagnosed 05/2018 - provoked event as she had undergone a left 3-5 digit toe amputation on 10/1 with only physical activity being PT, otherwise she does not walk.  Planned tx x 3 months with AC then change to aspirin and plavix  On xarleto, but ran out and did not  refill dt lack of money  No longer an [facility] candidate secondary to [place].  Hypertensive emergency  Blood pressure goal less than 160   Home medications: Catapres 0.3 3 times daily, hydralazine 50 mg 4 times daily, lisinopril 30 mg a day, metoprolol 25 mg a day  Home meds all resumed in hospital  BP stable . Long-term BP goal normotensive  Hx of stroke  08/9620 small R PLIC infarct d/t SVD. Started on aspirin 325. D/c to CIR. HTN. HLD, DB, THC use, ETOH use, morbid obesity, OSA - noncompliant with medications  Hyperlipidemia, LDL 104, on no statin PTA, now on no statin given hemorrhage  Diabetes  HgbA1c 7.0, goal < 7.0 - much improved. 2 yrs previous 12.3  Under management. Appears controlled   SSI  CBG monitoring  Other Stroke Risk Factors  Former cigarette smoker - quit  Substance abuse - THC  Obesity, Body mass index is 36.87 kg/m., recommend weight loss, diet and exercise as appropriate   Family hx stroke (father - brothers)  Migraines  Obstructive sleep apnea, on CPAP at home- compliant - using as IP  Other Active Problems  Microcytic anemia 9.8->10.0->9.4->8.9 - check stool for occult blood and monitor CBCs (already ordered)  Substance abuse history  Hx Left foot lesser toe amputation using Silvadene cream.  WOC RN consulted.  Continue current plan of care.  Follow by outpatient physician as PTA  Hx GIB, history of hematochezia August 2019 with colonoscopy revealing several ulcers and polyps.  Refer to GI 05/2018  Schizoaffective disorder, bipolar type  Neuropathy  Chronic constipation - on MiraLAX.  Patient denies lactulose at home. Added senokot q hs and prn bisacodyl and fleets.   Contact precautions for MRSA  Hospital day # 15   Personally  participated in, made any corrections needed, and agree with history, physical, neuro exam,assessment and plan as stated above.     Sarina Ill, MD [facility]  Personally examined  patient and images, and have participated in and made any corrections needed to history, physical, neuro exam,assessment and plan as stated above.  I have personally obtained the history, evaluated lab date, reviewed imaging studies and agree with radiology interpretations.    Sarina Ill, MD Stroke Neurology   A total of 15 minutes was spent for the care of this patient, spent on counseling patient and family on different diagnostic and therapeutic options, counseling and coordination of care, riskd ans benefits of management, compliance, or risk factor reduction and education.    To contact Stroke Continuity provider, please refer to [email or web address] After hours, contact General Neurology

## 2018-09-15 LAB — CBC
HCT: 28.6 % — ABNORMAL LOW (ref 36.0–46.0)
Hemoglobin: 8.6 g/dL — ABNORMAL LOW (ref 12.0–15.0)
MCH: 21.5 pg — ABNORMAL LOW (ref 26.0–34.0)
MCHC: 30.1 g/dL (ref 30.0–36.0)
MCV: 71.5 fL — ABNORMAL LOW (ref 80.0–100.0)
Platelets: 297 10*3/uL (ref 150–400)
RBC: 4 MIL/uL (ref 3.87–5.11)
RDW: 15.1 % (ref 11.5–15.5)
WBC: 5.8 10*3/uL (ref 4.0–10.5)
nRBC: 0 % (ref 0.0–0.2)

## 2018-09-15 LAB — BASIC METABOLIC PANEL
Anion gap: 8 (ref 5–15)
BUN: 16 mg/dL (ref 6–20)
CO2: 24 mmol/L (ref 22–32)
Calcium: 8.8 mg/dL — ABNORMAL LOW (ref 8.9–10.3)
Chloride: 105 mmol/L (ref 98–111)
Creatinine, Ser: 1.11 mg/dL — ABNORMAL HIGH (ref 0.44–1.00)
GFR calc Af Amer: >60 mL/min (ref >60)
GFR calc non Af Amer: 60 mL/min — ABNORMAL LOW (ref >60)
Glucose, Bld: 125 mg/dL — ABNORMAL HIGH (ref 70–99)
Potassium: 3.8 mmol/L (ref 3.5–5.1)
Sodium: 137 mmol/L (ref 135–145)

## 2018-09-15 LAB — URINE CULTURE: Culture: >=100000 — AB

## 2018-09-15 LAB — GLUCOSE, CAPILLARY
GLUCOSE-CAPILLARY: 153 mg/dL — AB (ref 70–99)
GLUCOSE-CAPILLARY: 160 mg/dL — AB (ref 70–99)
Glucose-Capillary: 148 mg/dL — ABNORMAL HIGH (ref 70–99)
Glucose-Capillary: 89 mg/dL (ref 70–99)

## 2018-09-15 MED ORDER — LIP MEDEX EX OINT
TOPICAL_OINTMENT | CUTANEOUS | Status: DC | PRN
  Administered 2018-09-15: 1 via TOPICAL
  Filled 2018-09-15 (×2): qty 7

## 2018-09-15 MED ORDER — SODIUM CHLORIDE 0.9 % IV SOLN
1.0000 g | INTRAVENOUS | Status: AC
  Administered 2018-09-15 – 2018-09-17 (×3): 1 g via INTRAVENOUS
  Filled 2018-09-15 (×3): qty 10

## 2018-09-15 NOTE — Progress Notes (Addendum)
STROKE TEAM PROGRESS NOTE   SUBJECTIVE (INTERVAL HISTORY)  Patient reports no new events, stable, sitting in chair. Awaiting SNF.  OBJECTIVE Vitals:   09/14/18 2219 09/14/18 2355 09/15/18 0204 09/15/18 0424  BP: (!) 152/83 (!) 151/91 (!) 157/88 (!) 151/88  Pulse: 66 68 67 61  Resp: 18  20   Temp:  97.7 F (36.5 C) 97.8 F (36.6 C) 97.9 F (36.6 C)  TempSrc:  Oral Oral Oral  SpO2: 99% 100%  100%  Weight:      Height:        CBC:  Recent Labs  Lab 09/14/18 0626 09/15/18 0642  WBC 5.0 5.8  HGB 8.9* 8.6*  HCT 29.2* 28.6*  MCV 71.4* 71.5*  PLT 280 034    Basic Metabolic Panel:  Recent Labs  Lab 09/13/18 0620 09/14/18 0626  NA 135 138  K 3.6 3.9  CL 102 104  CO2 23 26  GLUCOSE 220* 95  BUN 19 16  CREATININE 1.39* 1.09*  CALCIUM 8.7* 8.8*    PHYSICAL EXAM - Stable physical and neurologic exam General - Well nourished, well developed, in no apparent distress.  Cardiovascular - Regular rate and rhythm.  Mental Status -  Level of arousal and orientation to time, place, and person were intact. Language including expression, naming, repetition, comprehension was assessed and found intact.  Mild dysarthria  Cranial Nerves II - XII - II - Visual field intact OU. III, IV, VI - Extraocular movements intact. V - Facial sensation decreased on the left VII - left facial droop VIII - Hearing & vestibular intact bilaterally. X - Palate elevates symmetrically, mild dysarthria. XI - Chin turning & shoulder shrug intact bilaterally. XII - Tongue protrusion intact.  Motor Strength - The patient's strength was normal in right upper and lower extremities, however left upper extremity is 3-/5 and left lower extremity 2/5, left fourth and fifth digit amputation.  Bulk was normal and fasciculations were absent.   Motor Tone - Muscle tone was assessed at the neck and appendages and was normal.  Sensory - Light touch, temperature/pinprick were assessed and were decreased on the  left  Coordination - The patient had normal movements in the right hand with no ataxia or dysmetria.  Tremor was absent.  Gait and Station - deferred.   ASSESSMENT/PLAN Ms. Rhealynn Myhre is a 48 y.o. female with history of PE (10/19 on xarelto), CVA ( 2016; residual left side weakness), HTN, ASPVD, DM HLD, COPD  presenting with left arm numbness and facial droop. She did not receive IV t-PA due to [place].  ICH - right posterior BG/PLIC hemorrhage likely due to uncontrolled hypertension   CT head 1/30 1330 - ICH in the right  posterior lentiform nucleus - multiple old infarcts  CT head 1/30 2024 - no change  MRI/MRA head and neck unchanged R BG hmg. 10-20 scattered microhemorrhages.   Bilateral lower extremity venous dopplers - negative for DVT  2D Echo - 06/24/18  - EF 65 - 70%. No cardiac source of emboli identified.   LDL  104  HgbA1c 7.0  VTE prophylaxis - SCDs   clopidogrel 75 mg daily, Xarelto (rivaroxaban) daily but recently ran out of medications prior to admission, added aspirin 81 mg daily today. Not a long-term AC candidate    Therapy recommendations:  SNF   Disposition:  Pending - medically ready for d/c. SW getting MCD reinstated and looking for facility.  Spiking fever  Tmax 102.3->100.8  UA lg LE, no  nitrated, > 50 WBC, many bacteria  Urine culture 09/12/2018 > 100,000 col E Coli - Start IV Rocephin x 3 days (afebrile ; WBCs 5.8)  Blood culture no growth after 24h  CXR scaring L mid lung, no edema or consolidation, stable prominence  Flu PCR positive MRSA (unchanged)  AKI on CKD stage II  Cre 1.39 (baseline 1.2-1.25)  Decreased appetite   Encourage po intake  Start IVF @ 75  BMP monitoring  PE  Diagnosed 05/2018 - provoked event as she had undergone a left 3-5 digit toe amputation on 10/1 with only physical activity being PT, otherwise she does not walk.  Planned tx x 3 months with AC then change to aspirin and plavix  On xarleto, but  ran out and did not refill dt lack of money  No longer an [facility] candidate secondary to [place].  Hypertensive emergency  Blood pressure goal less than 160   Home medications: Catapres 0.3 3 times daily, hydralazine 50 mg 4 times daily, lisinopril 30 mg a day, metoprolol 25 mg a day  Home meds all resumed in hospital  BP stable . Long-term BP goal normotensive  Hx of stroke  11/979 small R PLIC infarct d/t SVD. Started on aspirin 325. D/c to CIR. HTN. HLD, DB, THC use, ETOH use, morbid obesity, OSA - noncompliant with medications  Hyperlipidemia, LDL 104, on no statin PTA, now on no statin given hemorrhage  Diabetes  HgbA1c 7.0, goal < 7.0 - much improved. 2 yrs previous 12.3  Under management. Appears controlled   SSI  CBG monitoring  Other Stroke Risk Factors  Former cigarette smoker - quit  Substance abuse - THC  Obesity, Body mass index is 36.87 kg/m., recommend weight loss, diet and exercise as appropriate   Family hx stroke (father - brothers)  Migraines  Obstructive sleep apnea, on CPAP at home- compliant - using as IP  Other Active Problems  Microcytic anemia 9.8->10.0->9.4->8.9 - check stool for occult blood and monitor CBCs (already ordered)  Substance abuse history  Hx Left foot lesser toe amputation using Silvadene cream.  WOC RN consulted.  Continue current plan of care.  Follow by outpatient physician as PTA  Hx GIB, history of hematochezia August 2019 with colonoscopy revealing several ulcers and polyps.  Refer to GI 05/2018  Schizoaffective disorder, bipolar type  Neuropathy  Chronic constipation - on MiraLAX.  Patient denies lactulose at home. Added senokot q hs and prn bisacodyl and fleets.   Contact precautions for MRSA  Hospital day # 16   Patient with history of stroke, pulmonary embolism after foot surgery on Xarelto and aspirin with a right basal ganglia bleed likely hypertensive.  Patient is stable she is pending subacute nursing  facility.  Personally  participated in, made any corrections needed, and agree with history, physical, neuro exam,assessment and plan as stated above.    Personally examined patient and images, and have participated in and made any corrections needed to history, physical, neuro exam,assessment and plan as stated above.  I have personally obtained the history, evaluated lab date, reviewed imaging studies and agree with radiology interpretations.   A total of 15 minutes was spent for the care of this patient, spent on counseling patient and family on different diagnostic and therapeutic options, counseling and coordination of care, riskd ans benefits of management, compliance, or risk factor reduction and education.  [facility]  To contact Stroke Continuity provider, please refer to [email or web address] After hours, contact General  Neurology

## 2018-09-16 LAB — CBC
HCT: 30.7 % — ABNORMAL LOW (ref 36.0–46.0)
Hemoglobin: 9.6 g/dL — ABNORMAL LOW (ref 12.0–15.0)
MCH: 22.5 pg — ABNORMAL LOW (ref 26.0–34.0)
MCHC: 31.3 g/dL (ref 30.0–36.0)
MCV: 72.1 fL — ABNORMAL LOW (ref 80.0–100.0)
PLATELETS: 332 10*3/uL (ref 150–400)
RBC: 4.26 MIL/uL (ref 3.87–5.11)
RDW: 15.4 % (ref 11.5–15.5)
WBC: 6.5 10*3/uL (ref 4.0–10.5)
nRBC: 0 % (ref 0.0–0.2)

## 2018-09-16 LAB — GLUCOSE, CAPILLARY
GLUCOSE-CAPILLARY: 127 mg/dL — AB (ref 70–99)
Glucose-Capillary: 119 mg/dL — ABNORMAL HIGH (ref 70–99)
Glucose-Capillary: 111 mg/dL — ABNORMAL HIGH (ref 70–99)
Glucose-Capillary: 130 mg/dL — ABNORMAL HIGH (ref 70–99)

## 2018-09-16 LAB — BASIC METABOLIC PANEL
Anion gap: 7 (ref 5–15)
BUN: 14 mg/dL (ref 6–20)
CO2: 26 mmol/L (ref 22–32)
Calcium: 9 mg/dL (ref 8.9–10.3)
Chloride: 106 mmol/L (ref 98–111)
Creatinine, Ser: 1.16 mg/dL — ABNORMAL HIGH (ref 0.44–1.00)
GFR calc non Af Amer: 56 mL/min — ABNORMAL LOW (ref >60)
Glucose, Bld: 126 mg/dL — ABNORMAL HIGH (ref 70–99)
Potassium: 3.7 mmol/L (ref 3.5–5.1)
Sodium: 139 mmol/L (ref 135–145)

## 2018-09-16 NOTE — Progress Notes (Signed)
Occupational Therapy Treatment Patient Details Name: Diamond Rice MRN: [id number] DOB: 1970/10/26 Today's Date: 09/16/2018    History of present illness Pt adm with lt sided weakness and CT showed hemorrhagic infarct arising in the left posterior lentiform nucleus. PMH:  rt CVA, lt transmet amputation 05/28/18, Sleep apnea, COPD, smoker, DM, Bipolar, HTN, anxiety. Pt with recent SNF stay from Oct - Dec 31st after transmetatarsal amputation prior to this admission.    OT comments  Upon entering the room, pt supine in bed but agreeable to OT intervention. Session focus on forced use of L UE for functional tasks, transfers, and standing endurance. Static sitting balance for 4 minutes at close supervision. Pt ambulating 10' to sink with steady assistance. Pt standing for 6 minutes to wash face and begin brushing teeth before needing to take seated rest break. Pt utilized B hands for coordination tasks with self care with min verbal cuing and encouragement.  Pt standing to finish task and then requesting to use bathroom for BM. Pt required min A for toilet transfer and able to manage clothing with steady assistance for standing balance. NT notified of pt on toilet at end of session. Call bell within reach.  Follow Up Recommendations  SNF;Supervision/Assistance - 24 hour    Equipment Recommendations  None recommended by OT    Recommendations for Other Services      Precautions / Restrictions Precautions Precautions: Fall Precaution Comments: left hemiparesis Restrictions Weight Bearing Restrictions: No       Mobility Bed Mobility Overal bed mobility: Needs Assistance Bed Mobility: Rolling Rolling: Min guard   Supine to sit: Min guard Sit to supine: Min guard   General bed mobility comments: assistance to elevate trunk into sitting  Transfers Overall transfer level: Needs assistance Equipment used: Rolling walker (2 wheeled) Transfers: Sit to/from Stand Sit to Stand: Min  guard Stand pivot transfers: Min guard            Balance Overall balance assessment: Needs assistance Sitting-balance support: Single extremity supported;Feet supported Sitting balance-Leahy Scale: Fair Sitting balance - Comments: supervision for static sitting balance   Standing balance support: Bilateral upper extremity supported;During functional activity Standing balance-Leahy Scale: Poor Standing balance comment: Pt needs BUE use and RW for support with transfers and sit to stand.          ADL either performed or assessed with clinical judgement   ADL       Grooming: Wash/dry face;Sitting;Oral care;Standing;Min guard Grooming Details (indicate cue type and reason): min guard for balance       Toilet Transfer: Min guard;RW           Vision Baseline Vision/History: No visual deficits Patient Visual Report: No change from baseline            Cognition Arousal/Alertness: Lethargic;Awake/alert Behavior During Therapy: Flat affect Overall Cognitive Status: Impaired/Different from baseline Area of Impairment: Safety/judgement;Awareness;Problem solving      Safety/Judgement: Decreased awareness of safety;Decreased awareness of deficits Awareness: Emergent Problem Solving: Slow processing                     Pertinent Vitals/ Pain       Pain Assessment: Faces Faces Pain Scale: No hurt         Frequency  Min 2X/week        Progress Toward Goals  OT Goals(current goals can now be found in the care plan section)  Progress towards OT goals: Progressing toward goals  Acute Rehab OT  Goals Patient Stated Goal: to move better OT Goal Formulation: With patient Time For Goal Achievement: 09/30/18 Potential to Achieve Goals: Good  Plan Discharge plan remains appropriate       AM-PAC OT "6 Clicks" Daily Activity     Outcome Measure   Help from another person eating meals?: A Little Help from another person taking care of personal grooming?:  A Little Help from another person toileting, which includes using toliet, bedpan, or urinal?: A Little Help from another person bathing (including washing, rinsing, drying)?: A Lot Help from another person to put on and taking off regular upper body clothing?: A Little Help from another person to put on and taking off regular lower body clothing?: A Lot 6 Click Score: 16    End of Session Equipment Utilized During Treatment: Rolling walker  OT Visit Diagnosis: Unsteadiness on feet (R26.81);Muscle weakness (generalized) (M62.81);Hemiplegia and hemiparesis Hemiplegia - Right/Left: Left Hemiplegia - dominant/non-dominant: Non-Dominant Hemiplegia - caused by: Cerebral infarction   Activity Tolerance Patient tolerated treatment well   Patient Left Other (comment)(on commode - NT notified in room)   Nurse Communication Mobility status        Time: 7096-2836 OT Time Calculation (min): 23 min  Charges: OT General Charges $OT Visit: 1 Visit OT Treatments $Self Care/Home Management : 23-37 mins    Laykin Rainone P, MS, OTR/L 09/16/2018, 12:29 PM

## 2018-09-16 NOTE — Progress Notes (Signed)
STROKE TEAM PROGRESS NOTE   SUBJECTIVE (INTERVAL HISTORY)  Patient reports no new events, stable, sitting in chair and eating her meal.No complaints.. Awaiting SNF.  OBJECTIVE Vitals:   09/15/18 1640 09/15/18 2057 09/16/18 0000 09/16/18 0558  BP: (!) 162/97 (!) 146/94 (!) 141/92 (!) 163/96  Pulse: 66 69 70 61  Resp: 15 18 18 20   Temp: 98.1 F (36.7 C) 98.9 F (37.2 C) 98.7 F (37.1 C) 98.7 F (37.1 C)  TempSrc: Oral Oral Oral Oral  SpO2: 100% 100% 100% 99%  Weight:      Height:        CBC:  Recent Labs  Lab 09/15/18 0642 09/16/18 0619  WBC 5.8 6.5  HGB 8.6* 9.6*  HCT 28.6* 30.7*  MCV 71.5* 72.1*  PLT 297 431    Basic Metabolic Panel:  Recent Labs  Lab 09/15/18 0642 09/16/18 0619  NA 137 139  K 3.8 3.7  CL 105 106  CO2 24 26  GLUCOSE 125* 126*  BUN 16 14  CREATININE 1.11* 1.16*  CALCIUM 8.8* 9.0    PHYSICAL EXAM - Stable physical and neurologic exam General - obese middle aged african american lady in no apparent distress.  Cardiovascular - Regular rate and rhythm.  Mental Status -  Level of arousal and orientation to time, place, and person were intact. Language including expression, naming, repetition, comprehension was assessed and found intact.  Mild dysarthria  Cranial Nerves II - XII - II - Visual field intact OU. III, IV, VI - Extraocular movements intact. V - Facial sensation decreased on the left VII - left facial droop VIII - Hearing & vestibular intact bilaterally. X - Palate elevates symmetrically, mild dysarthria. XI - Chin turning & shoulder shrug intact bilaterally. XII - Tongue protrusion intact.  Motor Strength - The patient's strength was normal in right upper and lower extremities, however left upper extremity is 3-/5 and left lower extremity 2/5, left fourth and fifth digit amputation.  Bulk was normal and fasciculations were absent.   Motor Tone - Muscle tone was assessed at the neck and appendages and was normal.  Sensory -  Light touch, temperature/pinprick were assessed and were decreased on the left  Coordination - The patient had normal movements in the right hand with no ataxia or dysmetria.  Tremor was absent.  Gait and Station - deferred.   ASSESSMENT/PLAN Ms. Marua Qin is a 48 y.o. female with history of PE (10/19 on xarelto), CVA ( 2016; residual left side weakness), HTN, ASPVD, DM HLD, COPD  presenting with left arm numbness and facial droop. She did not receive IV t-PA due to [place].  ICH - right posterior BG/PLIC hemorrhage likely due to uncontrolled hypertension   CT head 1/30 1330 - ICH in the right  posterior lentiform nucleus - multiple old infarcts  CT head 1/30 2024 - no change  MRI/MRA head and neck unchanged R BG hmg. 10-20 scattered microhemorrhages.   Bilateral lower extremity venous dopplers - negative for DVT  2D Echo - 06/24/18  - EF 65 - 70%. No cardiac source of emboli identified.   LDL  104  HgbA1c 7.0  VTE prophylaxis - SCDs   clopidogrel 75 mg daily, Xarelto (rivaroxaban) daily but recently ran out of medications prior to admission, added aspirin 81 mg daily today. Not a long-term AC candidate    Therapy recommendations:  SNF   Disposition:  Pending - medically ready for d/c. SW getting MCD reinstated and looking for facility.  Spiking fever  Tmax 102.3->100.8  UA lg LE, no nitrated, > 50 WBC, many bacteria  Urine culture 09/12/2018 > 100,000 col E Coli - Start IV Rocephin x 3 days (afebrile ; WBCs 5.8)  Blood culture no growth after 24h  CXR scaring L mid lung, no edema or consolidation, stable prominence  Flu PCR positive MRSA (unchanged)  AKI on CKD stage II  Cre 1.39 (baseline 1.2-1.25)  Decreased appetite   Encourage po intake  Start IVF @ 75  BMP monitoring  PE  Diagnosed 05/2018 - provoked event as she had undergone a left 3-5 digit toe amputation on 10/1 with only physical activity being PT, otherwise she does not walk.  Planned tx  x 3 months with AC then change to aspirin and plavix  On xarleto, but ran out and did not refill dt lack of money  No longer an [facility] candidate secondary to [place].  Hypertensive emergency  Blood pressure goal less than 160   Home medications: Catapres 0.3 3 times daily, hydralazine 50 mg 4 times daily, lisinopril 30 mg a day, metoprolol 25 mg a day  Home meds all resumed in hospital  BP stable . Long-term BP goal normotensive  Hx of stroke  10/1515 small R PLIC infarct d/t SVD. Started on aspirin 325. D/c to CIR. HTN. HLD, DB, THC use, ETOH use, morbid obesity, OSA - noncompliant with medications  Hyperlipidemia, LDL 104, on no statin PTA, now on no statin given hemorrhage  Diabetes  HgbA1c 7.0, goal < 7.0 - much improved. 2 yrs previous 12.3  Under management. Appears controlled   SSI  CBG monitoring  Other Stroke Risk Factors  Former cigarette smoker - quit  Substance abuse - THC  Obesity, Body mass index is 36.87 kg/m., recommend weight loss, diet and exercise as appropriate   Family hx stroke (father - brothers)  Migraines  Obstructive sleep apnea, on CPAP at home- compliant - using as IP  Other Active Problems  Microcytic anemia 9.8->10.0->9.4->8.9 - check stool for occult blood and monitor CBCs (already ordered)  Substance abuse history  Hx Left foot lesser toe amputation using Silvadene cream.  WOC RN consulted.  Continue current plan of care.  Follow by outpatient physician as PTA  Hx GIB, history of hematochezia August 2019 with colonoscopy revealing several ulcers and polyps.  Refer to GI 05/2018  Schizoaffective disorder, bipolar type  Neuropathy  Chronic constipation - on MiraLAX.  Patient denies lactulose at home. Added senokot q hs and prn bisacodyl and fleets.   Contact precautions for MRSA  Hospital day # 17   Patient with history of stroke, pulmonary embolism after foot surgery on Xarelto and aspirin with a right basal ganglia bleed  likely hypertensive.  Patient is stable she is pending  Discharge to  Skilled  nursing facility.  Personally  participated in, made any corrections needed, and agree with history, physical, neuro exam,assessment and plan as stated above.    Personally examined patient and images, and have participated in and made any corrections needed to history, physical, neuro exam,assessment and plan as stated above.  I have personally obtained the history, evaluated lab date, reviewed imaging studies and agree with radiology interpretations.   A total of 25 minutes was spent for the care of this patient, spent on counseling patient and family on different diagnostic and therapeutic options, counseling and coordination of care, riskd ans benefits of management, compliance, or risk factor reduction and education.  Antony Contras, MD  To  contact Stroke Continuity provider, please refer to [email or web address] After hours, contact General Neurology

## 2018-09-17 LAB — CBC
HCT: 29.4 % — ABNORMAL LOW (ref 36.0–46.0)
Hemoglobin: 9 g/dL — ABNORMAL LOW (ref 12.0–15.0)
MCH: 21.8 pg — ABNORMAL LOW (ref 26.0–34.0)
MCHC: 30.6 g/dL (ref 30.0–36.0)
MCV: 71.2 fL — ABNORMAL LOW (ref 80.0–100.0)
Platelets: 319 10*3/uL (ref 150–400)
RBC: 4.13 MIL/uL (ref 3.87–5.11)
RDW: 15.2 % (ref 11.5–15.5)
WBC: 6.4 10*3/uL (ref 4.0–10.5)
nRBC: 0 % (ref 0.0–0.2)

## 2018-09-17 LAB — BASIC METABOLIC PANEL
Anion gap: 7 (ref 5–15)
BUN: 13 mg/dL (ref 6–20)
CHLORIDE: 106 mmol/L (ref 98–111)
CO2: 26 mmol/L (ref 22–32)
Calcium: 8.9 mg/dL (ref 8.9–10.3)
Creatinine, Ser: 1.04 mg/dL — ABNORMAL HIGH (ref 0.44–1.00)
GFR calc Af Amer: >60 mL/min (ref >60)
GFR calc non Af Amer: >60 mL/min (ref >60)
Glucose, Bld: 123 mg/dL — ABNORMAL HIGH (ref 70–99)
Potassium: 3.8 mmol/L (ref 3.5–5.1)
Sodium: 139 mmol/L (ref 135–145)

## 2018-09-17 LAB — GLUCOSE, CAPILLARY
Glucose-Capillary: 113 mg/dL — ABNORMAL HIGH (ref 70–99)
Glucose-Capillary: 107 mg/dL — ABNORMAL HIGH (ref 70–99)
Glucose-Capillary: 123 mg/dL — ABNORMAL HIGH (ref 70–99)
Glucose-Capillary: 94 mg/dL (ref 70–99)
Glucose-Capillary: 152 mg/dL — ABNORMAL HIGH (ref 70–99)

## 2018-09-17 LAB — CULTURE, BLOOD (ROUTINE X 2)
Culture: NO GROWTH
Culture: NO GROWTH
SPECIAL REQUESTS: ADEQUATE
SPECIAL REQUESTS: ADEQUATE

## 2018-09-17 NOTE — Progress Notes (Signed)
Physical Therapy Treatment Patient Details Name: Diamond Rice MRN: [id number] DOB: 1971-01-09 Today's Date: 09/17/2018    History of Present Illness Pt adm with lt sided weakness and CT showed hemorrhagic infarct arising in the left posterior lentiform nucleus. PMH:  rt CVA, lt transmet amputation 05/28/18, Sleep apnea, COPD, smoker, DM, Bipolar, HTN, anxiety. Pt with recent SNF stay from Oct - Dec 31st after transmetatarsal amputation prior to this admission.     PT Comments    Pt making slow, steady progress. Continue to recommend SNF.    Follow Up Recommendations  SNF     Equipment Recommendations  None recommended by PT    Recommendations for Other Services       Precautions / Restrictions Precautions Precautions: Fall Restrictions Weight Bearing Restrictions: No    Mobility  Bed Mobility Overal bed mobility: Needs Assistance Bed Mobility: Supine to Sit     Supine to sit: Mod assist     General bed mobility comments: Assist to bring legs off of bed and elevate trunk into sitting.  Transfers Overall transfer level: Needs assistance Equipment used: Rolling walker (2 wheeled) Transfers: Sit to/from Stand Sit to Stand: Min guard         General transfer comment: Assist for balance and safety and verbal cues for hand placement.  Ambulation/Gait Ambulation/Gait assistance: Min assist Gait Distance (Feet): 70 Feet Assistive device: Rolling walker (2 wheeled) Gait Pattern/deviations: Step-through pattern;Decreased stride length;Trunk flexed;Decreased step length - left Gait velocity: decreased Gait velocity interpretation: <1.31 ft/sec, indicative of household ambulator General Gait Details: Assist for balance. Assist to initially place lt hand on walker.   Stairs             Wheelchair Mobility    Modified Rankin (Stroke Patients Only) Modified Rankin (Stroke Patients Only) Pre-Morbid Rankin Score: Moderate disability Modified Rankin:  Moderately severe disability     Balance Overall balance assessment: Needs assistance Sitting-balance support: Feet supported;No upper extremity supported Sitting balance-Leahy Scale: Fair     Standing balance support: Bilateral upper extremity supported Standing balance-Leahy Scale: Poor Standing balance comment: walker and min guard for static standing                            Cognition Arousal/Alertness: Awake/alert Behavior During Therapy: Flat affect Overall Cognitive Status: Impaired/Different from baseline Area of Impairment: Safety/judgement;Awareness;Problem solving                         Safety/Judgement: Decreased awareness of safety;Decreased awareness of deficits Awareness: Emergent Problem Solving: Slow processing;Decreased initiation;Difficulty sequencing;Requires verbal cues        Exercises      General Comments        Pertinent Vitals/Pain Pain Assessment: No/denies pain    Home Living                      Prior Function            PT Goals (current goals can now be found in the care plan section) Acute Rehab PT Goals PT Goal Formulation: With patient Time For Goal Achievement: 10/01/18 Potential to Achieve Goals: Good Progress towards PT goals: Goals downgraded-see care plan    Frequency    Min 2X/week      PT Plan Current plan remains appropriate    Co-evaluation              AM-PAC PT "  6 Clicks" Mobility   Outcome Measure  Help needed turning from your back to your side while in a flat bed without using bedrails?: A Lot Help needed moving from lying on your back to sitting on the side of a flat bed without using bedrails?: A Lot Help needed moving to and from a bed to a chair (including a wheelchair)?: A Little Help needed standing up from a chair using your arms (e.g., wheelchair or bedside chair)?: A Little Help needed to walk in hospital room?: A Little Help needed climbing 3-5 steps  with a railing? : A Lot 6 Click Score: 15    End of Session Equipment Utilized During Treatment: Gait belt Activity Tolerance: Patient tolerated treatment well Patient left: in chair;with call bell/phone within reach;with chair alarm set Nurse Communication: Mobility status PT Visit Diagnosis: Hemiplegia and hemiparesis;Other abnormalities of gait and mobility (R26.89) Hemiplegia - Right/Left: Left Hemiplegia - dominant/non-dominant: Non-dominant Hemiplegia - caused by: Nontraumatic intracerebral hemorrhage     Time: 3570-1779 PT Time Calculation (min) (ACUTE ONLY): 25 min  Charges:  $Gait Training: 23-37 mins                     [place] Pager [phone] Office [place] 09/17/2018, 11:41 AM

## 2018-09-17 NOTE — Evaluation (Addendum)
Clinical/Bedside Swallow Evaluation Patient Details  Name: Diamond Rice MRN: [id number] Date of Birth: Jan 18, 1971  Today's Date: 09/17/2018 Time: SLP Start Time (ACUTE ONLY): 53 SLP Stop Time (ACUTE ONLY): 1654 SLP Time Calculation (min) (ACUTE ONLY): 24 min  Past Medical History:  Past Medical History:  Diagnosis Date  . Anxiety   . Chronic lower back pain 04/17/2012   "just got over some; catched when I walked"  . COPD (chronic obstructive pulmonary disease) ([place])   . Critical lower limb ischemia 05/27/2018  . Depression   . Headache(784.0) 04/17/2012   "~ qod; lately waking up in am w/one"  . High cholesterol   . Hypertension   . Migraines 04/17/2012  . Obesity   . Sleep apnea    CPAP  . Stroke ([place])   . Type II diabetes mellitus ([place]) 08/28/2002   Past Surgical History:  Past Surgical History:  Procedure Laterality Date  . ABDOMINAL AORTOGRAM W/LOWER EXTREMITY Left 05/27/2018   Procedure: ABDOMINAL AORTOGRAM W/LOWER EXTREMITY Run[place] and Possible Intervention;  Surgeon: Waynetta Sandy, MD;  Location: [place] [facility];  Service: Cardiovascular;  Laterality: Left;  . APPLICATION OF WOUND VAC Left 06/06/2018   Procedure: APPLICATION OF WOUND VAC;  Surgeon: Waynetta Sandy, MD;  Location: [place];  Service: Vascular;  Laterality: Left;  . BIOPSY  04/04/2018   Procedure: BIOPSY;  Surgeon: [name], MD;  Location: [facility] ENDOSCOPY;  Service: Endoscopy;;  . CARDIAC CATHETERIZATION  ~ 2011  . COLONOSCOPY N/A 04/04/2018   Procedure: COLONOSCOPY;  Surgeon: [name], MD;  Location: [facility] ENDOSCOPY;  Service: Endoscopy;  Laterality: N/A;  . LOWER EXTREMITY ANGIOGRAM Left 05/27/2018  . PERIPHERAL VASCULAR INTERVENTION Left 05/27/2018   Procedure: PERIPHERAL VASCULAR INTERVENTION;  Surgeon: Waynetta Sandy, MD;  Location: [place] [facility];  Service: Cardiovascular;  Laterality: Left;  . POLYPECTOMY  04/04/2018   Procedure: POLYPECTOMY;  Surgeon: [name], MD;  Location: [facility] ENDOSCOPY;  Service: Endoscopy;;  . TRANSMETATARSAL AMPUTATION Left 05/28/2018   Procedure: AMPUTATION TOES THREE, FOUR AND FIVE on left foot;  Surgeon: Waynetta Sandy, MD;  Location: [place];  Service: Vascular;  Laterality: Left;  . WOUND DEBRIDEMENT Left 06/06/2018   Procedure: DEBRIDEMENT WOUND LEFT FOOT;  Surgeon: Waynetta Sandy, MD;  Location: [place];  Service: Vascular;  Laterality: Left;   HPI:  Pt admitted with left sided weakness and CT showed hemorrhagic infarct arising in the left posterior lentiform nucleus. PMH:  rt CVA, lt transmet amputation 05/28/18, Sleep apnea, COPD, smoker, DM, Bipolar, HTN, anxiety.   Assessment / Plan / Recommendation Clinical Impression  Pt was seen for bedside swallow evaluation, She was participating in cognitive-linguistic treatment prior to this evaluation and she reported  symptoms of dysphagia characterized by coughing with p.o. intake. Nursing reported coughing and emesis followin p.o. intake and stated that it appeared that the emesis was secondary to prolonged coughing. Pt tolerated puree solids, two boluses of regular texture solids, and 4/5 trials of thin liquids via cup and straw without overt s/sx of aspiration. Mastication was within functional limits but mild residue was noted in the left lateral sulcus. She exhibited coughing following 4-5 swallows of regular texture solids and following intake of medication with thin liquids. She reported the sensation of "sticking" and identified the sternal notch/pharynx as the site of the sensation. Pt reported that the sensation was eliminated with liquid washes but continued throat clearing and coughing were demonstrated. Her diet will be downgraded to dysphagia  3 and it is recommended that medication be administered in puree. Pt has reported that she becomes "short of breath" during p.o. intake and the impact of this on her performance is considered. A modified barium  swallow study is recommended to further assess swallow function and to determine the least restrictive diet.   SLP Visit Diagnosis: Cognitive communication deficit (R41.841)    Aspiration Risk       Diet Recommendation Dysphagia 3 (Mech soft)   Liquid Administration via: Cup;Straw Medication Administration: Whole meds with puree Supervision: Patient able to self feed Compensations: Slow rate;Small sips/bites;Other (Comment)(Alternate solid and liquid boluses) Postural Changes: Seated upright at 90 degrees;Remain upright for at least 30 minutes after po intake    Other  Recommendations Oral Care Recommendations: Oral care BID   Follow up Recommendations Skilled Nursing facility      Frequency and Duration min 2x/week  2 weeks       Prognosis Prognosis for Safe Diet Advancement: Good Barriers to Reach Goals: Cognitive deficits;Behavior(Increased intake rate)      Swallow Study   General Date of Onset: 09/17/18 HPI: Pt admitted with left sided weakness and CT showed hemorrhagic infarct arising in the left posterior lentiform nucleus. PMH:  rt CVA, lt transmet amputation 05/28/18, Sleep apnea, COPD, smoker, DM, Bipolar, HTN, anxiety. Type of Study: Bedside Swallow Evaluation Previous Swallow Assessment: 09/09/14:  "mild left-sided weakness that results in anterior loss, prolonged oral transit, and pt's subjective c/o biting her inner cheek. Sensation and awareness appear appropriate, as she is able to self-regulate for the above. No overt signs of aspiration were observed throughout challenging. Recommend Dys 3 diet to facilitate oral preparation and thin liquids with brief SLP f/u for tolerance and advancement. Diet Prior to this Study: Regular;Thin liquids Temperature Spikes Noted: No Respiratory Status: Room air History of Recent Intubation: No Behavior/Cognition: Alert;Cooperative Oral Cavity Assessment: Within Functional Limits Oral Care Completed by SLP: Recent completion by  staff Oral Cavity - Dentition: Adequate natural dentition Vision: Functional for self-feeding Self-Feeding Abilities: Able to feed self Patient Positioning: Upright in bed;Postural control adequate for testing Baseline Vocal Quality: Low vocal intensity Volitional Cough: Weak Volitional Swallow: Able to elicit    Oral/Motor/Sensory Function Overall Oral Motor/Sensory Function: Within functional limits   Ice Chips Ice chips: Not tested(Pt requested that they be deferred)   Thin Liquid Thin Liquid: Impaired Presentation: Straw;Cup;Spoon Pharyngeal  Phase Impairments: Cough - Delayed(With 1/5 boluses of thin liquids )    Nectar Thick Nectar Thick Liquid: Not tested   Honey Thick Honey Thick Liquid: Not tested   Puree Puree: Within functional limits Presentation: Spoon   Solid     Solid: Impaired Presentation: Self Fed Oral Phase Functional Implications: Oral residue;Other (comment)(mild left lateral sulcus residue) Pharyngeal Phase Impairments: Other (comments);Cough - Delayed;Throat Clearing - Delayed(suspect residue )      [name] 09/17/2018,5:26 PM   Tobie Poet I. Hardin Negus, [place], [place] Office number [phone] Pager [phone]

## 2018-09-17 NOTE — Progress Notes (Addendum)
Nutrition Follow-up  DOCUMENTATION CODES:   Obesity unspecified  INTERVENTION:  Continue Glucerna Shake po TID, each supplement provides 220 kcal and 10 grams of protein.  Encourage adequate PO intake.   NUTRITION DIAGNOSIS:   Inadequate oral intake related to poor appetite as evidenced by meal completion < 50%; improving  GOAL:   Patient will meet greater than or equal to 90% of their needs; met  MONITOR:   PO intake, Supplement acceptance, Weight trends, I & O's, Skin, Labs  REASON FOR ASSESSMENT:   Malnutrition Screening Tool    ASSESSMENT:   48 y.o. female  With PMH of PE ( 10/19 on xarelto), CVA ( 2016; residual left side weakness), HTN, DM HLD, COPD who [facility] ED with c/o left arm numbness and facial droop.  Meal completion has been 80-100%. Intake and appetite has been good. Pt currently has Glucerna ordered and has been consuming them. RD to continue with current orders to aid in caloric and protein needs. Pt continues to await SNF placement.   Labs and medications reviewed.   Diet Order:   Diet Order            Diet heart healthy/carb modified Room service appropriate? Yes; Fluid consistency: Thin  Diet effective now              EDUCATION NEEDS:   Not appropriate for education at this time  Skin:  Skin Assessment: Reviewed RN Assessment  Last BM:  1/18  Height:   Ht Readings from Last 1 Encounters:  09/01/18 '5\' 1"'$  (1.549 m)    Weight:   Wt Readings from Last 1 Encounters:  09/01/18 88.5 kg    Ideal Body Weight:  47.7 kg  BMI:  Body mass index is 36.87 kg/m.  Estimated Nutritional Needs:   Kcal:  1700-1900  Protein:  85-100 grams  Fluid:  1.7 - 1.9 L/day   Corrin Parker, MS, RD, LDN Pager # [phone] After hours/ weekend pager # [phone]

## 2018-09-17 NOTE — Progress Notes (Signed)
  Speech Language Pathology Treatment: Cognitive-Linquistic  Patient Details Name: Diamond Rice MRN: [id number] DOB: Dec 24, 1970 Today's Date: 09/17/2018 Time: 9509-3267 SLP Time Calculation (min) (ACUTE ONLY): 32 min  Assessment / Plan / Recommendation Clinical Impression  Pt was seen in room for cognitive-linguistic treatment. Her speech intelligibility was improved compared to that which was noted during the last session and she reported that she believes her memory has improved. She demonstrated 100% accuracy with immediate 5-item recall and 40% accuracy with working memory tasks increasing to 100% accuracy with min-mod cues. She consistently required cues to recall 4 items following a 3-minute delay. A time management task was completed using a calendar and pt demonstrated 57% accuracy increasing to 100% accuracy with minimal-moderate cues. Pt reported coughing with thin liquids, solids, and solids. Perthe pt she has been symptomatic for some days and nursing indicated that this was observed 1-2x for this shift. It is recommended that a swallowing evaluation be completed to assess swallow function and the pt's nurse has been educated regarding this.    HPI HPI: Pt admitted with left sided weakness and CT showed hemorrhagic infarct arising in the left posterior lentiform nucleus. PMH:  rt CVA, lt transmet amputation 05/28/18, Sleep apnea, COPD, smoker, DM, Bipolar, HTN, anxiety.      SLP Plan  Continue with current plan of care       Recommendations   Bedside swallow evaluation                Oral Care Recommendations: Oral care BID Follow up Recommendations: Skilled Nursing facility SLP Visit Diagnosis: Cognitive communication deficit (T24.580) Plan: Continue with current plan of care       Diamond Rice I. Hardin Negus, [place], [place] Office number [phone] Pager [place] 09/17/2018, 4:25 PM

## 2018-09-17 NOTE — Progress Notes (Signed)
STROKE TEAM PROGRESS NOTE   SUBJECTIVE (INTERVAL HISTORY)  Patient reports no new events, stable, sitting in chair and eating her meal. No complaints.. Awaiting SNF.  OBJECTIVE Vitals:   09/17/18 0218 09/17/18 0443 09/17/18 0855 09/17/18 1210  BP:  (!) 146/86 (!) 147/99 135/88  Pulse:  (!) 58 (!) 59 68  Resp: 17 18 17 14   Temp:  97.6 F (36.4 C) 98.3 F (36.8 C) 98.2 F (36.8 C)  TempSrc:  Oral Oral Oral  SpO2: 98% 100% 98% 100%  Weight:      Height:        CBC:  Recent Labs  Lab 09/16/18 0619 09/17/18 0638  WBC 6.5 6.4  HGB 9.6* 9.0*  HCT 30.7* 29.4*  MCV 72.1* 71.2*  PLT 332 458    Basic Metabolic Panel:  Recent Labs  Lab 09/16/18 0619 09/17/18 0638  NA 139 139  K 3.7 3.8  CL 106 106  CO2 26 26  GLUCOSE 126* 123*  BUN 14 13  CREATININE 1.16* 1.04*  CALCIUM 9.0 8.9    PHYSICAL EXAM - Stable physical and neurologic exam General - obese middle aged african american lady in no apparent distress.  Cardiovascular - Regular rate and rhythm.  Mental Status -  Level of arousal and orientation to time, place, and person were intact. Language including expression, naming, repetition, comprehension was assessed and found intact.  Mild dysarthria  Cranial Nerves II - XII - II - Visual field intact OU. III, IV, VI - Extraocular movements intact. V - Facial sensation decreased on the left VII - left facial droop VIII - Hearing & vestibular intact bilaterally. X - Palate elevates symmetrically, mild dysarthria. XI - Chin turning & shoulder shrug intact bilaterally. XII - Tongue protrusion intact.  Motor Strength - The patient's strength was normal in right upper and lower extremities, however left upper extremity is 3-/5 and left lower extremity 2/5, left fourth and fifth digit amputation.  Bulk was normal and fasciculations were absent.   Motor Tone - Muscle tone was assessed at the neck and appendages and was normal.  Sensory - Light touch,  temperature/pinprick were assessed and were decreased on the left  Coordination - The patient had normal movements in the right hand with no ataxia or dysmetria.  Tremor was absent.  Gait and Station - deferred.   ASSESSMENT/PLAN Ms. Hiliary Osorto is a 48 y.o. female with history of PE (10/19 on xarelto), CVA ( 2016; residual left side weakness), HTN, ASPVD, DM HLD, COPD  presenting with left arm numbness and facial droop. She did not receive IV t-PA due to [place].  ICH - right posterior BG/PLIC hemorrhage likely due to uncontrolled hypertension   CT head 1/30 1330 - ICH in the right  posterior lentiform nucleus - multiple old infarcts  CT head 1/30 2024 - no change  MRI/MRA head and neck unchanged R BG hmg. 10-20 scattered microhemorrhages.   Bilateral lower extremity venous dopplers - negative for DVT  2D Echo - 06/24/18  - EF 65 - 70%. No cardiac source of emboli identified.   LDL  104  HgbA1c 7.0  VTE prophylaxis - SCDs   clopidogrel 75 mg daily, Xarelto (rivaroxaban) daily but recently ran out of medications prior to admission, added aspirin 81 mg daily today. Not a long-term AC candidate    Therapy recommendations:  SNF   Disposition:  Pending - medically ready for d/c. SW getting MCD reinstated and looking for facility.  Spiking fever  Tmax 102.3->100.8  UA lg LE, no nitrated, > 50 WBC, many bacteria  Urine culture 09/12/2018 > 100,000 col E Coli - Start IV Rocephin x 3 days (afebrile ; WBCs 5.8)  Blood culture no growth after 24h  CXR scaring L mid lung, no edema or consolidation, stable prominence  Flu PCR positive MRSA (unchanged)  AKI on CKD stage II  Cre 1.39 (baseline 1.2-1.25)  Decreased appetite   Encourage po intake  Start IVF @ 75  BMP monitoring  PE  Diagnosed 05/2018 - provoked event as she had undergone a left 3-5 digit toe amputation on 10/1 with only physical activity being PT, otherwise she does not walk.  Planned tx x 3 months  with AC then change to aspirin and plavix  On xarleto, but ran out and did not refill dt lack of money  No longer an [facility] candidate secondary to [place].  Hypertensive emergency  Blood pressure goal less than 160   Home medications: Catapres 0.3 3 times daily, hydralazine 50 mg 4 times daily, lisinopril 30 mg a day, metoprolol 25 mg a day  Home meds all resumed in hospital  BP stable . Long-term BP goal normotensive  Hx of stroke  11/971 small R PLIC infarct d/t SVD. Started on aspirin 325. D/c to CIR. HTN. HLD, DB, THC use, ETOH use, morbid obesity, OSA - noncompliant with medications  Hyperlipidemia, LDL 104, on no statin PTA, now on no statin given hemorrhage  Diabetes  HgbA1c 7.0, goal < 7.0 - much improved. 2 yrs previous 12.3  Under management. Appears controlled   SSI  CBG monitoring  Other Stroke Risk Factors  Former cigarette smoker - quit  Substance abuse - THC  Obesity, Body mass index is 36.87 kg/m., recommend weight loss, diet and exercise as appropriate   Family hx stroke (father - brothers)  Migraines  Obstructive sleep apnea, on CPAP at home- compliant - using as IP  Other Active Problems  Microcytic anemia 9.8->10.0->9.4->8.9 - check stool for occult blood and monitor CBCs (already ordered)  Substance abuse history  Hx Left foot lesser toe amputation using Silvadene cream.  WOC RN consulted.  Continue current plan of care.  Follow by outpatient physician as PTA  Hx GIB, history of hematochezia August 2019 with colonoscopy revealing several ulcers and polyps.  Refer to GI 05/2018  Schizoaffective disorder, bipolar type  Neuropathy  Chronic constipation - on MiraLAX.  Patient denies lactulose at home. Added senokot q hs and prn bisacodyl and fleets.   Contact precautions for MRSA  Hospital day # 18   Patient with history of stroke, pulmonary embolism after foot surgery on Xarelto and aspirin with a right basal ganglia bleed likely  hypertensive.  Patient is stable she is pending  Discharge to  Skilled  nursing facility.     Antony Contras, MD  To contact Stroke Continuity provider, please refer to [email or web address] After hours, contact General Neurology

## 2018-09-18 ENCOUNTER — Inpatient Hospital Stay (HOSPITAL_COMMUNITY): Payer: Medicare Other

## 2018-09-18 LAB — GLUCOSE, CAPILLARY
Glucose-Capillary: 83 mg/dL (ref 70–99)
Glucose-Capillary: 104 mg/dL — ABNORMAL HIGH (ref 70–99)
Glucose-Capillary: 110 mg/dL — ABNORMAL HIGH (ref 70–99)
Glucose-Capillary: 114 mg/dL — ABNORMAL HIGH (ref 70–99)

## 2018-09-18 MED ORDER — HYDRALAZINE HCL 20 MG/ML IJ SOLN
20.0000 mg | Freq: Three times a day (TID) | INTRAMUSCULAR | Status: DC | PRN
  Administered 2018-09-19: 20 mg via INTRAVENOUS
  Filled 2018-09-18: qty 1

## 2018-09-18 MED ORDER — LABETALOL HCL 5 MG/ML IV SOLN: 20.0000 mg | INTRAVENOUS | Status: DC | PRN

## 2018-09-18 NOTE — Plan of Care (Signed)
  Problem: Education: Goal: Knowledge of secondary prevention will improve Outcome: Progressing   Problem: Education: Goal: Knowledge of disease or condition will improve Outcome: Progressing   Problem: Education: Goal: Knowledge of patient specific risk factors addressed and post discharge goals established will improve Outcome: Progressing

## 2018-09-18 NOTE — Plan of Care (Signed)
Patient stable, discussed POC with patient, agreeable with plan, denies question/concerns at this time.  

## 2018-09-18 NOTE — Progress Notes (Signed)
STROKE TEAM PROGRESS NOTE   SUBJECTIVE (INTERVAL HISTORY)  Patient reports no new events, stable, getting a bath in bed.. No complaints.. Awaiting SNF.  OBJECTIVE Vitals:   09/17/18 2329 09/18/18 0400 09/18/18 0841 09/18/18 0843  BP: (!) 170/91 (!) 147/89 (!) 170/88 (!) 160/106  Pulse: 63 67 (!) 58 60  Resp: 18 16 19    Temp: 98.2 F (36.8 C) 97.9 F (36.6 C) 98 F (36.7 C)   TempSrc: Oral Oral Oral   SpO2: 99% 96% 100% 100%  Weight:      Height:        CBC:  Recent Labs  Lab 09/16/18 0619 09/17/18 0638  WBC 6.5 6.4  HGB 9.6* 9.0*  HCT 30.7* 29.4*  MCV 72.1* 71.2*  PLT 332 716    Basic Metabolic Panel:  Recent Labs  Lab 09/16/18 0619 09/17/18 0638  NA 139 139  K 3.7 3.8  CL 106 106  CO2 26 26  GLUCOSE 126* 123*  BUN 14 13  CREATININE 1.16* 1.04*  CALCIUM 9.0 8.9    PHYSICAL EXAM - Stable physical and neurologic exam General - obese middle aged african american lady in no apparent distress.  Cardiovascular - Regular rate and rhythm.  Mental Status -  Level of arousal and orientation to time, place, and person were intact. Language including expression, naming, repetition, comprehension was assessed and found intact.  Mild dysarthria  Cranial Nerves II - XII - II - Visual field intact OU. III, IV, VI - Extraocular movements intact. V - Facial sensation decreased on the left VII - left facial droop VIII - Hearing & vestibular intact bilaterally. X - Palate elevates symmetrically, mild dysarthria. XI - Chin turning & shoulder shrug intact bilaterally. XII - Tongue protrusion intact.  Motor Strength - The patient's strength was normal in right upper and lower extremities, however left upper extremity is 3-/5 and left lower extremity 2/5, left fourth and fifth digit amputation.  Bulk was normal and fasciculations were absent.   Motor Tone - Muscle tone was assessed at the neck and appendages and was normal.  Sensory - Light touch, temperature/pinprick  were assessed and were decreased on the left  Coordination - The patient had normal movements in the right hand with no ataxia or dysmetria.  Tremor was absent.  Gait and Station - deferred.   ASSESSMENT/PLAN Ms. Paysley Poplar is a 48 y.o. female with history of PE (10/19 on xarelto), CVA ( 2016; residual left side weakness), HTN, ASPVD, DM HLD, COPD  presenting with left arm numbness and facial droop. She did not receive IV t-PA due to [place].  ICH - right posterior BG/PLIC hemorrhage likely due to uncontrolled hypertension   CT head 1/30 1330 - ICH in the right  posterior lentiform nucleus - multiple old infarcts  CT head 1/30 2024 - no change  MRI/MRA head and neck unchanged R BG hmg. 10-20 scattered microhemorrhages.   Bilateral lower extremity venous dopplers - negative for DVT  2D Echo - 06/24/18  - EF 65 - 70%. No cardiac source of emboli identified.   LDL  104  HgbA1c 7.0  VTE prophylaxis - SCDs   clopidogrel 75 mg daily, Xarelto (rivaroxaban) daily but recently ran out of medications prior to admission, added aspirin 81 mg daily today. Not a long-term AC candidate    Therapy recommendations:  SNF   Disposition:  Pending - medically ready for d/c. SW getting MCD reinstated and looking for facility.  Spiking fever  Tmax 102.3->100.8  UA lg LE, no nitrated, > 50 WBC, many bacteria  Urine culture 09/12/2018 > 100,000 col E Coli - Start IV Rocephin x 3 days (afebrile ; WBCs 5.8)  Blood culture no growth after 24h  CXR scaring L mid lung, no edema or consolidation, stable prominence  Flu PCR positive MRSA (unchanged)  AKI on CKD stage II  Cre 1.39 (baseline 1.2-1.25)  Decreased appetite   Encourage po intake  Start IVF @ 75  BMP monitoring  PE  Diagnosed 05/2018 - provoked event as she had undergone a left 3-5 digit toe amputation on 10/1 with only physical activity being PT, otherwise she does not walk.  Planned tx x 3 months with AC then change to  aspirin and plavix  On xarleto, but ran out and did not refill dt lack of money  No longer an [facility] candidate secondary to [place].  Hypertensive emergency  Blood pressure goal less than 160   Home medications: Catapres 0.3 3 times daily, hydralazine 50 mg 4 times daily, lisinopril 30 mg a day, metoprolol 25 mg a day  Home meds all resumed in hospital  BP stable . Long-term BP goal normotensive  Hx of stroke  03/8827 small R PLIC infarct d/t SVD. Started on aspirin 325. D/c to CIR. HTN. HLD, DB, THC use, ETOH use, morbid obesity, OSA - noncompliant with medications  Hyperlipidemia, LDL 104, on no statin PTA, now on no statin given hemorrhage  Diabetes  HgbA1c 7.0, goal < 7.0 - much improved. 2 yrs previous 12.3  Under management. Appears controlled   SSI  CBG monitoring  Other Stroke Risk Factors  Former cigarette smoker - quit  Substance abuse - THC  Obesity, Body mass index is 36.87 kg/m., recommend weight loss, diet and exercise as appropriate   Family hx stroke (father - brothers)  Migraines  Obstructive sleep apnea, on CPAP at home- compliant - using as IP  Other Active Problems  Microcytic anemia 9.8->10.0->9.4->8.9 - check stool for occult blood and monitor CBCs (already ordered)  Substance abuse history  Hx Left foot lesser toe amputation using Silvadene cream.  WOC RN consulted.  Continue current plan of care.  Follow by outpatient physician as PTA  Hx GIB, history of hematochezia August 2019 with colonoscopy revealing several ulcers and polyps.  Refer to GI 05/2018  Schizoaffective disorder, bipolar type  Neuropathy  Chronic constipation - on MiraLAX.  Patient denies lactulose at home. Added senokot q hs and prn bisacodyl and fleets.   Contact precautions for MRSA  Hospital day # 19   Patient with history of stroke, pulmonary embolism after foot surgery on Xarelto and aspirin with a right basal ganglia bleed likely hypertensive.  Patient is stable  she is pending  Discharge to  Skilled  nursing facility when bed available. Getting PASAR assessment today     .  Antony Contras, MD  To contact Stroke Continuity provider, please refer to [email or web address] After hours, contact General Neurology

## 2018-09-18 NOTE — Plan of Care (Signed)
  Problem: Education: Goal: Individualized Educational Video(s) Outcome: Progressing   Problem: Coping: Goal: Will verbalize positive feelings about self Outcome: Progressing   Problem: Coping: Goal: Will identify appropriate support needs Outcome: Progressing   Problem: Education: Goal: Knowledge of patient specific risk factors addressed and post discharge goals established will improve Outcome: Progressing

## 2018-09-18 NOTE — Progress Notes (Signed)
  Speech Language Pathology Treatment:    Patient Details Name: Diamond Rice MRN: [id number] DOB: 09/16/1970 Today's Date: 09/18/2018 Time: 5361-4431 SLP Time Calculation (min) (ACUTE ONLY): 10 min  Assessment / Plan / Recommendation Clinical Impression  Pt was seen in room for cognitive-linguistic treatment. She was pleasant and cooperative throughout the session but stated that she was tired. Pt was educated that the modified barium swallow study is currently scheduled for 9:30 and she verbalized understanding. She completed labial and lingual strengthening exercises and demonstrated 50% accuracy with paragraph level auditory recall. She tolerated consecutive swallows of thin liquids via straw without overt s/sx of aspiration. The session was abbreviated due to transport arriving for her to be taken for the swallow study.    HPI HPI: Pt admitted with left sided weakness and CT showed hemorrhagic infarct arising in the left posterior lentiform nucleus. PMH:  rt CVA, lt transmet amputation 05/28/18, Sleep apnea, COPD, smoker, DM, Bipolar, HTN, anxiety.      SLP Plan  MBS;Continue with current plan of care       Recommendations  Diet recommendations: Dysphagia 3 (mechanical soft);Thin liquid Liquids provided via: Cup;Straw Medication Administration: Whole meds with puree Compensations: Slow rate;Small sips/bites;Other (Comment)(Alternate solid and liquid boluses)                Oral Care Recommendations: Oral care BID Follow up Recommendations: Skilled Nursing facility SLP Visit Diagnosis: Cognitive communication deficit (V40.086) Plan: MBS;Continue with current plan of care       Alexanderjames Berg I. Hardin Negus, [place], [place] Office number [phone] Pager [place] 09/18/2018, 9:27 AM

## 2018-09-18 NOTE — Progress Notes (Signed)
Modified Barium Swallow Progress Note  Patient Details  Name: Diamond Rice MRN: [id number] Date of Birth: 1970/11/03  Today's Date: 09/18/2018  Modified Barium Swallow completed.  Full report located under Chart Review in the Imaging Section.  Brief recommendations include the following:  Clinical Impression  Pt was seen in radiology suite for modified barium swallow study. She was seated upright in swallow chair for the study which was conducted in lateral view. Trials of puree, mixed consistencies (i.e., peaches with thin liquids), regular texture solids, a 20mm barium tablet, and thin liquids (via tsp and straw) were administered. Her oropahryngeal swallow mechanism was within functional limits. However, esophageal retention was noted at the level of the mid to lower thoracic esophagus. Retention persisted after at least 30 seconds and was not cleared with dry secondary swallows. Pt did report the globus sensation when this was observed. An esophageal workup is recommended to further evaluate esophageal function. Per the pt's preference, her diet will not be upgraded at this time since she feels she has been having less difficulty since it was downgraded to dysphagia 3. Pt and her nurse were educated regarding recomendations and plan of care. Both parties verbalized understanding as well as agreement.    Swallow Evaluation Recommendations   Recommended Consults: Consider esophageal assessment   SLP Diet Recommendations: Dysphagia 3 (Mech soft) solids;Thin liquid   Liquid Administration via: Cup;Straw   Medication Administration: Whole meds with puree   Supervision: Patient able to self feed   Compensations: Small sips/bites;Slow rate   Postural Changes: Remain semi-upright after after feeds/meals (Comment);Seated upright at 90 degrees   Oral Care Recommendations: Oral care BID      Diamond Rice I. Diamond Rice, [place], [place] Office number  [phone] Pager [phone]   [name] 09/18/2018,10:27 AM

## 2018-09-19 LAB — GLUCOSE, CAPILLARY
GLUCOSE-CAPILLARY: 136 mg/dL — AB (ref 70–99)
Glucose-Capillary: 75 mg/dL (ref 70–99)
Glucose-Capillary: 116 mg/dL — ABNORMAL HIGH (ref 70–99)
Glucose-Capillary: 185 mg/dL — ABNORMAL HIGH (ref 70–99)

## 2018-09-19 NOTE — Progress Notes (Signed)
STROKE TEAM PROGRESS NOTE   SUBJECTIVE (INTERVAL HISTORY)  Patient reports no new events, stable, getting a bath in bed.. No complaints.. Awaiting SNF.  OBJECTIVE Vitals:   09/19/18 1135 09/19/18 1148 09/19/18 1200 09/19/18 1453  BP: (!) 188/100  139/79 (!) 146/75  Pulse: 60  66 60  Resp:      Temp:  98.8 F (37.1 C)    TempSrc:      SpO2:      Weight:      Height:        CBC:  Recent Labs  Lab 09/16/18 0619 09/17/18 0638  WBC 6.5 6.4  HGB 9.6* 9.0*  HCT 30.7* 29.4*  MCV 72.1* 71.2*  PLT 332 166    Basic Metabolic Panel:  Recent Labs  Lab 09/16/18 0619 09/17/18 0638  NA 139 139  K 3.7 3.8  CL 106 106  CO2 26 26  GLUCOSE 126* 123*  BUN 14 13  CREATININE 1.16* 1.04*  CALCIUM 9.0 8.9    PHYSICAL EXAM - Stable physical and neurologic exam General - obese middle aged african american lady in no apparent distress.  Cardiovascular - Regular rate and rhythm.  Mental Status -  Level of arousal and orientation to time, place, and person were intact. Language including expression, naming, repetition, comprehension was assessed and found intact.  Mild dysarthria  Cranial Nerves II - XII - II - Visual field intact OU. III, IV, VI - Extraocular movements intact. V - Facial sensation decreased on the left VII - left facial droop VIII - Hearing & vestibular intact bilaterally. X - Palate elevates symmetrically, mild dysarthria. XI - Chin turning & shoulder shrug intact bilaterally. XII - Tongue protrusion intact.  Motor Strength - The patient's strength was normal in right upper and lower extremities, however left upper extremity is 3-/5 and left lower extremity 2/5, left fourth and fifth digit amputation.  Bulk was normal and fasciculations were absent.   Motor Tone - Muscle tone was assessed at the neck and appendages and was normal.  Sensory - Light touch, temperature/pinprick were assessed and were decreased on the left  Coordination - The patient had normal  movements in the right hand with no ataxia or dysmetria.  Tremor was absent.  Gait and Station - deferred.   ASSESSMENT/PLAN [name] is a 48 y.o. female with history of PE (10/19 on xarelto), CVA ( 2016; residual left side weakness), HTN, ASPVD, DM HLD, COPD  presenting with left arm numbness and facial droop. She did not receive IV t-PA due to [place].  ICH - right posterior BG/PLIC hemorrhage likely due to uncontrolled hypertension   CT head 1/30 1330 - ICH in the right  posterior lentiform nucleus - multiple old infarcts  CT head 1/30 2024 - no change  MRI/MRA head and neck unchanged R BG hmg. 10-20 scattered microhemorrhages.   Bilateral lower extremity venous dopplers - negative for DVT  2D Echo - 06/24/18  - EF 65 - 70%. No cardiac source of emboli identified.   LDL  104  HgbA1c 7.0  VTE prophylaxis - SCDs   clopidogrel 75 mg daily, Xarelto (rivaroxaban) daily but recently ran out of medications prior to admission, added aspirin 81 mg daily today. Not a long-term AC candidate    Therapy recommendations:  SNF   Disposition:  Pending - medically ready for d/c. SW getting MCD reinstated and looking for facility.  Spiking fever  Tmax 102.3->100.8  UA lg LE, no nitrated, >  50 WBC, many bacteria  Urine culture 09/12/2018 > 100,000 col E Coli - Start IV Rocephin x 3 days (afebrile ; WBCs 5.8)  Blood culture no growth after 24h  CXR scaring L mid lung, no edema or consolidation, stable prominence  Flu PCR positive MRSA (unchanged)  AKI on CKD stage II  Cre 1.39 (baseline 1.2-1.25)  Decreased appetite   Encourage po intake  Start IVF @ 75  BMP monitoring  PE  Diagnosed 05/2018 - provoked event as she had undergone a left 3-5 digit toe amputation on 10/1 with only physical activity being PT, otherwise she does not walk.  Planned tx x 3 months with AC then change to aspirin and plavix  On xarleto, but ran out and did not refill dt lack of  money  No longer an [facility] candidate secondary to [place].  Hypertensive emergency  Blood pressure goal less than 160   Home medications: Catapres 0.3 3 times daily, hydralazine 50 mg 4 times daily, lisinopril 30 mg a day, metoprolol 25 mg a day  Home meds all resumed in hospital  BP stable . Long-term BP goal normotensive  Hx of stroke  08/9164 small R PLIC infarct d/t SVD. Started on aspirin 325. D/c to CIR. HTN. HLD, DB, THC use, ETOH use, morbid obesity, OSA - noncompliant with medications  Hyperlipidemia, LDL 104, on no statin PTA, now on no statin given hemorrhage  Diabetes  HgbA1c 7.0, goal < 7.0 - much improved. 2 yrs previous 12.3  Under management. Appears controlled   SSI  CBG monitoring  Other Stroke Risk Factors  Former cigarette smoker - quit  Substance abuse - THC  Obesity, Body mass index is 36.87 kg/m., recommend weight loss, diet and exercise as appropriate   Family hx stroke (father - brothers)  Migraines  Obstructive sleep apnea, on CPAP at home- compliant - using as IP  Other Active Problems  Microcytic anemia 9.8->10.0->9.4->8.9 - check stool for occult blood and monitor CBCs (already ordered)  Substance abuse history  Hx Left foot lesser toe amputation using Silvadene cream.  WOC RN consulted.  Continue current plan of care.  Follow by outpatient physician as PTA  Hx GIB, history of hematochezia August 2019 with colonoscopy revealing several ulcers and polyps.  Refer to GI 05/2018  Schizoaffective disorder, bipolar type  Neuropathy  Chronic constipation - on MiraLAX.  Patient denies lactulose at home. Added senokot q hs and prn bisacodyl and fleets.   Contact precautions for MRSA  Hospital day # 20   Patient with history of stroke, pulmonary embolism after foot surgery on Xarelto and aspirin with a right basal ganglia bleed likely hypertensive.  Patient is stable she is pending  Discharge to  Skilled  nursing facility when bed available.  Got PASAR assessment y`day but results unknown at this time     .  Antony Contras, MD  To contact Stroke Continuity provider, please refer to [email or web address] After hours, contact General Neurology

## 2018-09-20 LAB — GLUCOSE, CAPILLARY
Glucose-Capillary: 102 mg/dL — ABNORMAL HIGH (ref 70–99)
Glucose-Capillary: 104 mg/dL — ABNORMAL HIGH (ref 70–99)
Glucose-Capillary: 113 mg/dL — ABNORMAL HIGH (ref 70–99)
Glucose-Capillary: 178 mg/dL — ABNORMAL HIGH (ref 70–99)

## 2018-09-20 MED ORDER — SENNOSIDES-DOCUSATE SODIUM 8.6-50 MG PO TABS: 1.0000 | ORAL_TABLET | Freq: Every day | ORAL | Status: DC

## 2018-09-20 MED ORDER — SILVER SULFADIAZINE 1 % EX CREA: TOPICAL_CREAM | Freq: Every day | CUTANEOUS | 0 refills | Status: DC

## 2018-09-20 MED ORDER — GLUCERNA SHAKE PO LIQD: 237.0000 mL | Freq: Three times a day (TID) | ORAL | 0 refills | Status: DC

## 2018-09-20 MED ORDER — ASPIRIN 81 MG PO TBEC: 81.0000 mg | DELAYED_RELEASE_TABLET | Freq: Every day | ORAL | Status: DC

## 2018-09-20 MED ORDER — GABAPENTIN 100 MG PO CAPS: 100.0000 mg | ORAL_CAPSULE | Freq: Two times a day (BID) | ORAL | Status: DC

## 2018-09-20 MED ORDER — LIP MEDEX EX OINT: TOPICAL_OINTMENT | CUTANEOUS | 0 refills | Status: DC | PRN

## 2018-09-20 MED ORDER — POLYETHYLENE GLYCOL 3350 17 G PO PACK: 17.0000 g | PACK | Freq: Every day | ORAL | 0 refills | Status: DC | PRN

## 2018-09-20 MED ORDER — MUSCLE RUB 10-15 % EX CREA: 1.0000 "application " | TOPICAL_CREAM | Freq: Four times a day (QID) | CUTANEOUS | 0 refills | Status: DC | PRN

## 2018-09-20 NOTE — Discharge Summary (Addendum)
Stroke Discharge Summary  Patient ID: Diamond Rice   MRN: [id number]      DOB: [birth year]  Date of Admission: 08/30/2018 Date of Discharge: 09/21/2018  Attending Physician:  Garvin Fila, MD, Stroke MD Consultant(s):    Maryfrances Bunnell, RN (wound ostomy and continence nurse), Delice Lesch, MD (Physical Medicine & Rehabtilitation)  Patient's PCP:  [name], MD  Past Medical History:  Diagnosis Date  . Anxiety   . Chronic lower back pain 04/17/2012   "just got over some; catched when I walked"  . COPD (chronic obstructive pulmonary disease) ([place])   . Critical lower limb ischemia 05/27/2018  . Depression   . Headache(784.0) 04/17/2012   "~ qod; lately waking up in am w/one"  . High cholesterol   . Hypertension   . Migraines 04/17/2012  . Obesity   . Sleep apnea    CPAP  . Stroke ([place])   . Type II diabetes mellitus ([place]) 08/28/2002   Past Surgical History:  Procedure Laterality Date  . ABDOMINAL AORTOGRAM W/LOWER EXTREMITY Left 05/27/2018   Procedure: ABDOMINAL AORTOGRAM W/LOWER EXTREMITY [place] and Possible Intervention;  Surgeon: Waynetta Sandy, MD;  Location: [place] [facility];  Service: Cardiovascular;  Laterality: Left;  . APPLICATION OF WOUND VAC Left 06/06/2018   Procedure: APPLICATION OF WOUND VAC;  Surgeon: Waynetta Sandy, MD;  Location: [place];  Service: Vascular;  Laterality: Left;  . BIOPSY  04/04/2018   Procedure: BIOPSY;  Surgeon: [name], MD;  Location: [facility] ENDOSCOPY;  Service: Endoscopy;;  . CARDIAC CATHETERIZATION  ~ 2011  . COLONOSCOPY N/A 04/04/2018   Procedure: COLONOSCOPY;  Surgeon: [name], MD;  Location: [facility] ENDOSCOPY;  Service: Endoscopy;  Laterality: N/A;  . LOWER EXTREMITY ANGIOGRAM Left 05/27/2018  . PERIPHERAL VASCULAR INTERVENTION Left 05/27/2018   Procedure: PERIPHERAL VASCULAR INTERVENTION;  Surgeon: Waynetta Sandy, MD;  Location: [place] [facility];  Service: Cardiovascular;  Laterality: Left;  .  POLYPECTOMY  04/04/2018   Procedure: POLYPECTOMY;  Surgeon: [name], MD;  Location: [facility] ENDOSCOPY;  Service: Endoscopy;;  . TRANSMETATARSAL AMPUTATION Left 05/28/2018   Procedure: AMPUTATION TOES THREE, FOUR AND FIVE on left foot;  Surgeon: Waynetta Sandy, MD;  Location: [place];  Service: Vascular;  Laterality: Left;  . WOUND DEBRIDEMENT Left 06/06/2018   Procedure: DEBRIDEMENT WOUND LEFT FOOT;  Surgeon: Waynetta Sandy, MD;  Location: [place];  Service: Vascular;  Laterality: Left;    Allergies as of 09/20/2018      Reactions   Morphine And Related Hives   Metformin Diarrhea      Medication List    STOP taking these medications   clopidogrel 75 MG tablet Commonly known as:  PLAVIX   lactulose 10 GM/15ML solution Commonly known as:  CHRONULAC   Menthol (Topical Analgesic) 4 % Gel Commonly known as:  BIOFREEZE   oxycodone 5 MG capsule Commonly known as:  OXY-IR   rivaroxaban 20 MG Tabs tablet Commonly known as:  XARELTO     TAKE these medications   ARIPiprazole 15 MG tablet Commonly known as:  ABILIFY Take 1 tablet (15 mg total) by mouth at bedtime.   aspirin 81 MG EC tablet Take 1 tablet (81 mg total) by mouth daily. Start taking on:  September 21, 2018   cloNIDine 0.3 MG tablet Commonly known as:  CATAPRES Take 1 tablet (0.3 mg total) by mouth 3 (three) times daily.   feeding supplement (GLUCERNA SHAKE) Liqd Take 237  mLs by mouth 3 (three) times daily between meals.   ferrous sulfate 325 (65 FE) MG tablet Take 1 tablet (325 mg total) by mouth daily with breakfast.   furosemide 40 MG tablet Commonly known as:  LASIX Take 1 tablet (40 mg total) by mouth daily.   gabapentin 100 MG capsule Commonly known as:  NEURONTIN Take 1 capsule (100 mg total) by mouth 2 (two) times daily. What changed:  additional instructions   hydrALAZINE 50 MG tablet Commonly known as:  APRESOLINE Take 1 tablet (50 mg total) by mouth 4 (four) times daily.    Insulin Detemir 100 UNIT/ML Pen Commonly known as:  LEVEMIR Inject 15 Units into the skin 2 (two) times daily. What changed:    when to take this  additional instructions   lip balm ointment Apply topically as needed for lip care.   lisinopril 30 MG tablet Commonly known as:  PRINIVIL,ZESTRIL Take 1 tablet (30 mg total) by mouth daily.   Melatonin 3 MG Tabs Take 3 mg by mouth at bedtime.   metoprolol tartrate 25 MG tablet Commonly known as:  LOPRESSOR Take 1 tablet (25 mg total) by mouth 2 (two) times daily.   MUSCLE RUB 10-15 % Crea Apply 1 application topically every 6 (six) hours as needed for muscle pain.   pentoxifylline 400 MG CR tablet Commonly known as:  TRENTAL Take 1 tablet (400 mg total) by mouth 3 (three) times daily with meals.   polyethylene glycol packet Commonly known as:  MIRALAX / GLYCOLAX Take 17 g by mouth daily as needed for mild constipation. What changed:    when to take this  reasons to take this   potassium chloride SA 20 MEQ tablet Commonly known as:  K-DUR,KLOR-CON Take 1 tablet (20 mEq total) by mouth daily.   senna-docusate 8.6-50 MG tablet Commonly known as:  Senokot-S Take 1 tablet by mouth at bedtime. What changed:    how much to take  when to take this   silver sulfADIAZINE 1 % cream Commonly known as:  SILVADENE Apply topically daily. Start taking on:  September 21, 2018       LABORATORY STUDIES CBC    Component Value Date/Time   WBC 6.4 09/17/2018 0638   RBC 4.13 09/17/2018 0638   HGB 9.0 (L) 09/17/2018 0638   HCT 29.4 (L) 09/17/2018 0638   PLT 319 09/17/2018 0638   MCV 71.2 (L) 09/17/2018 0638   MCH 21.8 (L) 09/17/2018 0638   MCHC 30.6 09/17/2018 0638   RDW 15.2 09/17/2018 0638   LYMPHSABS 3.6 08/30/2018 1312   MONOABS 0.6 08/30/2018 1312   EOSABS 0.2 08/30/2018 1312   BASOSABS 0.1 08/30/2018 1312   CMP    Component Value Date/Time   NA 139 09/17/2018 0638   K 3.8 09/17/2018 0638   CL 106 09/17/2018  0638   CO2 26 09/17/2018 0638   GLUCOSE 123 (H) 09/17/2018 0638   BUN 13 09/17/2018 0638   CREATININE 1.04 (H) 09/17/2018 0638   CREATININE 1.81 (H) 05/23/2018 1620   CALCIUM 8.9 09/17/2018 0638   PROT 7.8 08/30/2018 1312   ALBUMIN 3.3 (L) 08/30/2018 1312   AST 17 08/30/2018 1312   ALT 10 08/30/2018 1312   ALKPHOS 73 08/30/2018 1312   BILITOT 0.4 08/30/2018 1312   GFRNONAA >60 09/17/2018 0638   GFRNONAA 54 (L) 09/28/2014 1034   GFRAA >60 09/17/2018 0638   GFRAA 62 09/28/2014 1034   COAGS Lab Results  Component Value Date  INR 1.11 08/30/2018   INR 1.08 06/22/2018   INR 0.96 04/02/2018   Lipid Panel    Component Value Date/Time   CHOL 158 09/01/2018 0320   TRIG 42 09/01/2018 0320   HDL 46 09/01/2018 0320   CHOLHDL 3.4 09/01/2018 0320   VLDL 8 09/01/2018 0320   LDLCALC 104 (H) 09/01/2018 0320   HgbA1C  Lab Results  Component Value Date   HGBA1C 7.0 (H) 09/01/2018   Urinalysis    Component Value Date/Time   COLORURINE YELLOW 09/12/2018 1156   APPEARANCEUR HAZY (A) 09/12/2018 1156   LABSPEC 1.013 09/12/2018 1156   PHURINE 5.0 09/12/2018 1156   GLUCOSEU NEGATIVE 09/12/2018 1156   HGBUR NEGATIVE 09/12/2018 1156   BILIRUBINUR NEGATIVE 09/12/2018 1156   KETONESUR NEGATIVE 09/12/2018 1156   PROTEINUR NEGATIVE 09/12/2018 1156   UROBILINOGEN 0.2 08/28/2014 1143   NITRITE NEGATIVE 09/12/2018 1156   LEUKOCYTESUR LARGE (A) 09/12/2018 1156   Urine Drug Screen     Component Value Date/Time   LABOPIA NONE DETECTED 09/06/2018 0028   COCAINSCRNUR NONE DETECTED 09/06/2018 0028   LABBENZ NONE DETECTED 09/06/2018 0028   AMPHETMU NONE DETECTED 09/06/2018 0028   THCU POSITIVE (A) 09/06/2018 0028   LABBARB NONE DETECTED 09/06/2018 0028    Alcohol Level    Component Value Date/Time   ETH <10 01/13/2018 1839     SIGNIFICANT DIAGNOSTIC STUDIES Ct Head Wo Contrast 08/30/2018 1326 1. Apparent hemorrhagic infarct arising in the left posterior lentiform nucleus with slight  surrounding edema. No other foci of hemorrhage evident.  2. Small vessel disease in the centra semiovale bilaterally. Prior small infarcts in the left cerebellum and left thalamus. Tiny lacunar infarcts in each anterior lentiform nucleus.  3.  There are foci of arterial vascular calcification.  4.  Areas of paranasal sinus disease.  5.  Probable cerumen in each external auditory canal.   Ct Head Wo Contrast 08/30/2018 2024 1. No change in the acute hematoma or hemorrhagic infarction in the posterior right basal ganglia/lateral thalamus/posterior limb internal capsule measures 2.3 x 1.7 x 1.7 cma (volume = 3.5 cm).  2. Chronic small-vessel ischemic changes of the hemispheric white matter.   MRI Head MRA Head and Neck 09/02/2018 1753 1. Unchanged size of acute hemorrhage within the posterior right basal ganglia, likely hypertensive. No midline shift or other mass effect. 2. No emergent large vessel occlusion or high-grade stenosis. Moderate narrowing of the proximal P1 segment of the right PCA. 3. Normal MRA of the neck. 4. 10-20 scattered chronic microhemorrhages, likely indicating chronic hypertensive angiopathy. 5. Chronic ischemic microangiopathy.  Vas [place] Lower Extremity Venous (dvt) 08/30/2018 Right: There is no evidence of deep vein thrombosis in the lower extremity. No cystic structure found in the popliteal fossa.  Left: There is no evidence of deep vein thrombosis in the lower extremity. However, portions of this examination were limited- see technologist comments above. No cystic structure found in the popliteal fossa.   Transthoracic Echocardiogram  06/24/2018 - Left ventricle: The cavity size was moderately reduced. Wallthickness was increased in a pattern of severe LVH. Systolicfunction was vigorous. The estimated ejection fraction was in therange of 65% to 70%. Doppler parameters are consistent withabnormal left ventricular relaxation (grade 1 diastolicdysfunction). - Aortic  valve: There was mild to moderate regurgitation. - Left atrium: The atrium was mildly dilated. - Pericardium, extracardiac: A trivial pericardial effusion wasidentified. Impressions:   Compared to report from 2016, LV is thicker. Considerinfiltrative process vs hypertrophic cardiomyopathy.  CXR 09/12/2018 1231 Scarring  left mid lung. No edema or consolidation. Stable cardiac prominence.   HISTORY OF PRESENT ILLNESS Ethne Jeon is an 48 y.o. female  With PMH of PE ( 10/19 on xarelto), CVA ( 2016; residual left side weakness), HTN, DM HLD, COPD who presented to [facility] ED with c/o left arm numbness and facial droop. Patient went to bed in her usual state of health. When she woke up this morning she states that she was normal. About 0900 on 08/30/2018 ([facility]) she had an onset of left arm numbness. Patient has not been on xarelto or plavix for 3 days ( 08/27/18), she did not have the money to pick up her prescriptions. Denies any diplopia, CP, SOB, HA,fever, abdominal pain, n/v or palpitations. CTH showed a hemorrhagic infarct in right lentiform nucleus. BP: 170/98.  BG: 135. She has a hx of stroke 12/05/79: right PLIC infarct secondary to small vessel disease; left side residual deficits. Modified Rankin: Rankin Score=1. NIHSS:3 drift, sensation, facial droop. Intracerebral Hemorrhage (ICH) Score: 0.  She was admitted to the neuro ICU for further evaluation and treatment.   HOSPITAL COURSE/DISCHARGE DIAGNOSIS:  Ms. Taimane Stimmel is a 48 y.o. female with history of PE (10/19 on xarelto), CVA ( 2016; residual left side weakness), HTN, ASPVD, DM HLD, COPD  presenting with left arm numbness and facial droop. She did not receive IV t-PA due to [place].  Stroke:  Right posterior basal ganglia/posterior limb internal capsule intracerebral hemorrhage likely due to uncontrolled hypertension   CT head 1/30 1330 - ICH in the right  posterior lentiform nucleus - multiple old infarcts  CT head 1/30 2024 - no  change  MRI/MRA head and neck unchanged R BG hmg. 10-20 scattered microhemorrhages.   Bilateral lower extremity venous dopplers - negative for DVT  2D Echo - 06/24/18  - EF 65 - 70%. No cardiac source of emboli identified.   LDL  104  HgbA1c 7.0  clopidogrel 75 mg daily, Xarelto (rivaroxaban) daily but recently ran out of medications prior to admission.  Resumed aspirin 81 mg daily.  Continue at discharge.  Not a long-term AC candidate.  Therapy recommendations:  SNF   Disposition:  SNF  UTI, resolved  UTI not present on admission   Developed temperature in hospital 102.3->100.8  UA lg LE, no nitrated, > 50 WBC, many bacteria  Urine culture 09/12/2018 > 100,000 col E Coli, > 100,000 Proteus Mirabilis   Treated with IV Rocephin x 3 days beginning 09/15/2018 (afebrile ; WBCs 5.8)  Blood culture no growth after 24h  CXR scaring L mid lung, no edema or consolidation, stable prominence  Flu PCR positive MRSA (unchanged)  AKI on CKD stage II  Cre 1.39 (baseline 1.2-1.25)  Decreased appetite   Encourage po intake  Hx provoked PE  Diagnosed 05/2018 - provoked event as she had undergone a left 3-5 digit toe amputation on 10/1 with only physical activity being PT, otherwise she does not walk.  Planned tx x 3 months with AC then change to aspirin and plavix  On xarleto, but ran out and did not refill dt lack of money  No longer an [facility] candidate secondary to [place].  Hypertensive emergency  Blood pressure goal less than 160   Home medications: Catapres 0.3 3 times daily, hydralazine 50 mg 4 times daily, lisinopril 30 mg a day, metoprolol 25 mg a day  Home meds all resumed in hospital  BP stable  Long-term BP goal normotensive  Hx of stroke  08/2014  small R PLIC infarct d/t SVD. Started on aspirin 325. D/c to CIR. HTN. HLD, DB, THC use, ETOH use, morbid obesity, OSA - noncompliant with medications  Hyperlipidemia  LDL 104  on no statin PTA  Resume statin  at discharge  Diabetes  HgbA1c 7.0, goal < 7.0 - much improved. 2 yrs previous 12.3  Under management. Appears controlled   Other Stroke Risk Factors  Former cigarette smoker - quit  Substance abuse - THC  Obesity, Body mass index is 36.87 kg/m., recommend weight loss, diet and exercise as appropriate   Family hx stroke (father - brothers)  Migraines  Obstructive sleep apnea, on CPAP at home- compliant - using as inpatient  Other Active Problems  Substance abuse history  Hx Left foot lesser toe amputation using Silvadene cream.  WOC RN consulted.  Continue current plan of care.  Follow by outpatient physician as PTA  Microcytic anemia 9.0  Hx GIB, history of hematochezia August 2019 with colonoscopy revealing several ulcers and polyps.  Refer to GI 05/2018  Schizoaffective disorder, bipolar type  Neuropathy  Chronic constipation - on MiraLAX.  Patient denies lactulose at home. Added senokot q hs and prn bisacodyl and fleets.   Contact precautions for MRSA positive on admission and again at recheck  Stage II partial-thickness pressure wound right buttocks  Inadequate oral intake due to poor appetite.  On Glucerna shake.   DISCHARGE EXAM Blood pressure (!) 179/89, pulse 60, temperature 97.8 F (36.6 C), temperature source Oral, resp. rate 20, height 5\' 1"  (1.549 m), weight 88.5 kg, SpO2 98 %. General - obese middle aged african american lady in no apparent distress. Cardiovascular - Regular rate and rhythm. Mental Status -  Level of arousal and orientation to time, place, and person were intact. Language including expression, naming, repetition, comprehension was assessed and found intact.  Mild dysarthria Cranial Nerves II - XII - II - Visual field intact OU. III, IV, VI - Extraocular movements intact. V - Facial sensation decreased on the left VII - left facial droop VIII - Hearing & vestibular intact bilaterally. X - Palate elevates symmetrically, mild  dysarthria. XI - Chin turning & shoulder shrug intact bilaterally. XII - Tongue protrusion intact. Motor Strength - The patient's strength was normal in right upper and lower extremities, however left upper extremity is 3-/5 and left lower extremity 2/5, left fourth and fifth digit amputation.  Bulk was normal and fasciculations were absent.   Motor Tone - Muscle tone was assessed at the neck and appendages and was normal. Sensory - Light touch, temperature/pinprick were assessed and were decreased on the left Coordination - The patient had normal movements in the right hand with no ataxia or dysmetria.  Tremor was absent. Gait and Station - deferred.  Discharge Diet   heart healthy carb modified thin liquid  DISCHARGE PLAN  Disposition:  Skilled nursing facility for ongoing PT, OT and ST.   aspirin 81 mg daily for secondary stroke prevention.  Patient is no longer an anticoagulation candidate.  Ongoing risk factor control by Primary Care Physician at time of discharge  Follow-up Bakare, Mobolaji B, MD in 2 weeks.  Follow-up in [facility] in 4 weeks, office to schedule an appointment.   40 minutes were spent preparing discharge.  Burnetta Sabin, MSN, APRN, ANVP-BC, AGPCNP-BC Advanced Practice Stroke Nurse [place] for Schedule & Pager information 09/20/2018 2:00 PM   I have personally obtained history,examined this patient, reviewed notes,  independently viewed imaging studies, participated in medical decision making and plan of care.ROS completed by me personally and pertinent positives fully documented  I have made any additions or clarifications directly to the above note. Agree with note above.    Antony Contras, MD Medical Director [facility] Pager: [phone] 09/20/2018 2:17 PM

## 2018-09-20 NOTE — Progress Notes (Signed)
CSW received patient's PASRR number this morning. CSW discussed patient's case with [facility], faxed over information this morning. They have offered a bed for patient. Delay in discharge due to facility unable to get patient a CPAP until tomorrow morning. CSW updated MD.  CSW to set up transport for patient tomorrow.  Laveda Abbe, [place] Clinical Social Worker [phone]

## 2018-09-20 NOTE — Progress Notes (Signed)
Occupational Therapy Treatment Patient Details Name: Diamond Rice MRN: [id number] DOB: 04-Jul-1971 Today's Date: 09/20/2018    History of present illness Pt adm with lt sided weakness and CT showed hemorrhagic infarct arising in the left posterior lentiform nucleus. PMH:  rt CVA, lt transmet amputation 05/28/18, Sleep apnea, COPD, smoker, DM, Bipolar, HTN, anxiety. Pt with recent SNF stay from Oct - Dec 31st after transmetatarsal amputation prior to this admission.    OT comments  Pt progressing toward stated goals, L hemiplegia and decreased activity tolerance limiting ability to engage in functional BADL this date. Pt requiring min-mod A for bed mobility at this time for A at trunk and BLEs. Completed grooming at sink, pt able to tolerate standing for 25% of task, then requesting to sit. Consistent VC's required to have pt engage LUE in functional tasks. Pt washed face with LUE to simulate functional PNF patterns. Pt using LUE to brush teeth with cues. AROM of LUE in PNF patterns completed x5 in each plane. Assisted pt back to bed, needing mod A to manage BLEs, pt able to use RUE to pull self up in bed. Encouraged pt to continue SROM when able, pt agreeable. D/c recommendation for SNF remains appropriate, noted anticipate d/c to SNF soon. Will continue to follow acutely.   Follow Up Recommendations  SNF;Supervision/Assistance - 24 hour    Equipment Recommendations  Other (comment)(defer to next venue)    Recommendations for Other Services      Precautions / Restrictions Precautions Precautions: Fall Precaution Comments: left hemiparesis Restrictions Weight Bearing Restrictions: No       Mobility Bed Mobility Overal bed mobility: Needs Assistance Bed Mobility: Supine to Sit     Supine to sit: Mod assist Sit to supine: Mod assist   General bed mobility comments: Assist to bring trunk upright; BLE mgt back to bed  Transfers Overall transfer level: Needs assistance Equipment  used: Rolling walker (2 wheeled) Transfers: Sit to/from Stand Sit to Stand: Min assist;Min guard Stand pivot transfers: Min guard       General transfer comment: first sit > stand needing min A; noted pt standing min guard from OT when asked to engage in functional task    Balance Overall balance assessment: Needs assistance Sitting-balance support: Feet supported;No upper extremity supported Sitting balance-Leahy Scale: Fair Sitting balance - Comments: supervision   Standing balance support: Bilateral upper extremity supported;During functional activity Standing balance-Leahy Scale: Poor Standing balance comment: reliant on external support                           ADL either performed or assessed with clinical judgement   ADL Overall ADL's : Needs assistance/impaired     Grooming: Wash/dry face;Sitting;Oral care;Standing;Min guard Grooming Details (indicate cue type and reason): min guard for standing balance, fatigues easily and needing to sit to finish task; cues to use LUE                 Toilet Transfer: Designer, television/film set Details (indicate cue type and reason): simulated with chair         Functional mobility during ADLs: Rolling walker;Min guard General ADL Comments: Pt continuing to need cues to incorporate LUE in functional tasks, but noted with greater active movement in RUE.      Vision Baseline Vision/History: No visual deficits Patient Visual Report: No change from baseline     Perception     Praxis  Cognition Arousal/Alertness: Awake/alert Behavior During Therapy: Flat affect Overall Cognitive Status: Impaired/Different from baseline Area of Impairment: Problem solving;Safety/judgement                         Safety/Judgement: Decreased awareness of safety;Decreased awareness of deficits   Problem Solving: Slow processing;Decreased initiation;Requires verbal cues;Requires tactile cues General Comments:  Pt intermittently slow to process, other times is very sharp        Exercises     Shoulder Instructions       General Comments      Pertinent Vitals/ Pain       Pain Assessment: No/denies pain  Home Living                                          Prior Functioning/Environment              Frequency  Min 2X/week        Progress Toward Goals  OT Goals(current goals can now be found in the care plan section)  Progress towards OT goals: Progressing toward goals  Acute Rehab OT Goals Patient Stated Goal: to leave the hospital OT Goal Formulation: With patient Time For Goal Achievement: 09/30/18 Potential to Achieve Goals: Good  Plan Discharge plan remains appropriate    Co-evaluation                 AM-PAC OT "6 Clicks" Daily Activity     Outcome Measure   Help from another person eating meals?: A Little Help from another person taking care of personal grooming?: A Little Help from another person toileting, which includes using toliet, bedpan, or urinal?: A Little Help from another person bathing (including washing, rinsing, drying)?: A Lot Help from another person to put on and taking off regular upper body clothing?: A Little Help from another person to put on and taking off regular lower body clothing?: A Lot 6 Click Score: 16    End of Session Equipment Utilized During Treatment: Rolling walker;Gait belt  OT Visit Diagnosis: Unsteadiness on feet (R26.81);Muscle weakness (generalized) (M62.81);Hemiplegia and hemiparesis Hemiplegia - Right/Left: Left Hemiplegia - dominant/non-dominant: Non-Dominant Hemiplegia - caused by: Cerebral infarction   Activity Tolerance Patient tolerated treatment well   Patient Left in bed;with call bell/phone within reach;with bed alarm set   Nurse Communication Other (comment)(purewick needing replaced)        Time: 1500-1540 OT Time Calculation (min): 40 min  Charges: OT General  Charges $OT Visit: 1 Visit OT Treatments $Self Care/Home Management : 8-22 mins $Neuromuscular Re-education: 8-22 mins  Zenovia Jarred, MSOT, OTR/L [facility] Office: [place] 09/20/2018, 4:54 PM

## 2018-09-20 NOTE — Progress Notes (Signed)
STROKE TEAM PROGRESS NOTE   SUBJECTIVE (INTERVAL HISTORY)  Patient reports no new events, stable, getting a bath in bed.. No complaints.. Awaiting SNF.  OBJECTIVE Vitals:   09/20/18 0438 09/20/18 0845 09/20/18 1137 09/20/18 1625  BP: 131/87 (!) 159/90 (!) 179/89 (!) 163/90  Pulse: 60 (!) 58 60 66  Resp: 18 18 20 20   Temp: 97.8 F (36.6 C) 98 F (36.7 C) 97.8 F (36.6 C) 98.7 F (37.1 C)  TempSrc: Oral Oral Oral Oral  SpO2: 99% 98% 98% 100%  Weight:      Height:        CBC:  Recent Labs  Lab 09/16/18 0619 09/17/18 0638  WBC 6.5 6.4  HGB 9.6* 9.0*  HCT 30.7* 29.4*  MCV 72.1* 71.2*  PLT 332 423    Basic Metabolic Panel:  Recent Labs  Lab 09/16/18 0619 09/17/18 0638  NA 139 139  K 3.7 3.8  CL 106 106  CO2 26 26  GLUCOSE 126* 123*  BUN 14 13  CREATININE 1.16* 1.04*  CALCIUM 9.0 8.9    PHYSICAL EXAM - Stable physical and neurologic exam General - obese middle aged african american lady in no apparent distress.  Cardiovascular - Regular rate and rhythm.  Mental Status -  Level of arousal and orientation to time, place, and person were intact. Language including expression, naming, repetition, comprehension was assessed and found intact.  Mild dysarthria  Cranial Nerves II - XII - II - Visual field intact OU. III, IV, VI - Extraocular movements intact. V - Facial sensation decreased on the left VII - left facial droop VIII - Hearing & vestibular intact bilaterally. X - Palate elevates symmetrically, mild dysarthria. XI - Chin turning & shoulder shrug intact bilaterally. XII - Tongue protrusion intact.  Motor Strength - The patient's strength was normal in right upper and lower extremities, however left upper extremity is 3-/5 and left lower extremity 2/5, left fourth and fifth digit amputation.  Bulk was normal and fasciculations were absent.   Motor Tone - Muscle tone was assessed at the neck and appendages and was normal.  Sensory - Light touch,  temperature/pinprick were assessed and were decreased on the left  Coordination - The patient had normal movements in the right hand with no ataxia or dysmetria.  Tremor was absent.  Gait and Station - deferred.   ASSESSMENT/PLAN Diamond Rice a 48 y.o.femalewith history of PE (10/19 on xarelto), CVA ( 2016; residual left side weakness), HTN, ASPVD, DM HLD, COPDpresenting with left arm numbness and facial droop.Shedid not receive IV t-PA due to [place].  Stroke:  Right posterior basal ganglia/posterior limb internal capsule intracerebral hemorrhage likely due to uncontrolled hypertension   CT head 1/30 1330 -ICH in the right posterior lentiform nucleus- multiple old infarcts  CT head 1/30 2024 - no change  MRI/MRA head and neck unchanged R BG hmg. 10-20 scattered microhemorrhages.   Bilateral lower extremity venous dopplers - negative for DVT  2D Echo - 06/24/18 - EF 65 - 70%. No cardiac source of emboli identified.   LDL104  HgbA1c7.0  clopidogrel 75 mg daily, Xarelto (rivaroxaban) daily but recently ran out of medications prior to admission.  Resumed aspirin 81 mg daily.  Continue at discharge.  Not a long-term AC candidate.  Therapy recommendations: SNF   Disposition: SNF tomorrow  UTI, resolved  UTI not present on admission   Developed temperature in hospital 102.3->100.8  UA lg LE, no nitrated, > 50 WBC, many bacteria  Urine  culture 09/12/2018 >100,000 col E Coli, > 100,000 Proteus Mirabilis   Treated with IV Rocephin x 3 days beginning 09/15/2018 (afebrile ; WBCs 5.8)  Blood culture no growth after 24h  CXR scaring L mid lung, no edema or consolidation, stable prominence  Flu PCR positive MRSA (unchanged)  AKI on CKD stage II  Cre 1.39 (baseline 1.2-1.25)  Decreased appetite   Encourage po intake  Hx provoked PE  Diagnosed 05/2018 - provoked event as she had undergone a left 3-5 digit toe amputation on 10/1 with only physical  activity being PT, otherwise she does not walk.  Planned tx x 3 months with AC then change to aspirin and plavix  On xarleto, but ran out and did not refill dt lack of money  No longer an [facility] candidate secondary to [place].  Hypertensive emergency  Blood pressure goal less than 160   Home medications: Catapres 0.3 3 times daily, hydralazine 50 mg 4 times daily, lisinopril 30 mg a day, metoprolol 25 mg a day  Home meds all resumed in hospital  BP stable  Long-term BP goal normotensive  Hx of stroke  02/7938 small R PLIC infarct d/t SVD. Started on aspirin 325. D/c to CIR. HTN. HLD, DB, THC use, ETOH use, morbid obesity, OSA - noncompliant with medications  Hyperlipidemia  LDL 104  on no statin PTA  Resume statin at discharge  Diabetes  HgbA1c 7.0, goal < 7.0 - much improved. 2 yrs previous 12.3  Under management. Appears controlled   Other Stroke Risk Factors  Former cigarette smoker - quit  Substance abuse - THC  Obesity,Body mass index is 36.87 kg/m., recommend weight loss, diet and exercise as appropriate   Family hx stroke (father - brothers)  Migraines  Obstructive sleep apnea, on CPAP at home- compliant - using as inpatient  Other Active Problems  Substance abuse history  Hx Left foot lesser toe amputation using Silvadene cream. WOC RN consulted. Continue current plan of care. Follow by outpatient physician as PTA  Microcytic anemia 9.0  Hx GIB, history of hematochezia August 2019 with colonoscopy revealing several ulcers and polyps.Refer to GI 05/2018  Schizoaffective disorder, bipolar type  Neuropathy  Chronic constipation - on MiraLAX. Patient denies lactulose at home. Added senokot q hs and prn bisacodyl and fleets.   Contact precautions for MRSA positive on admission and again at recheck  Stage II partial-thickness pressure wound right buttocks  Inadequate oral intake due to poor appetite.  On Glucerna shake.   Hospital day  # [place], MSN, APRN, ANVP-BC, AGPCNP-BC Advanced Practice Stroke Nurse [place] for Schedule & Pager information 09/20/2018 4:45 PM   To contact Stroke Continuity provider, please refer to [email or web address] After hours, contact General Neurology

## 2018-09-20 NOTE — Progress Notes (Signed)
Placed patient on CPAP for night time rest.  Patient tolerating well at this time.  RT will continue to monitor.

## 2018-09-20 NOTE — Progress Notes (Signed)
Physical Therapy Treatment Patient Details Name: Diamond Rice MRN: [id number] DOB: [birth year] Today's Date: 09/20/2018    History of Present Illness Pt adm with lt sided weakness and CT showed hemorrhagic infarct arising in the left posterior lentiform nucleus. PMH:  rt CVA, lt transmet amputation 05/28/18, Sleep apnea, COPD, smoker, DM, Bipolar, HTN, anxiety. Pt with recent SNF stay from Oct - Dec 31st after transmetatarsal amputation prior to this admission.     PT Comments    Pt is noted to have unsteady gait on LLE with extended walk today, but has been on her feet with OT doing ADL's.  Her plan is to progress with all mobility and strengthen LLE which currently in supine is fairly passive.  Follow acutely to increase interaction of L side with all mobility.  Her bed is postured to have hemi side to the door, and will continue to encourage L side usage as many ways as possible with PT.   Follow Up Recommendations  SNF     Equipment Recommendations  None recommended by PT    Recommendations for Other Services Rehab consult     Precautions / Restrictions Precautions Precautions: Fall Precaution Comments: left hemiparesis Restrictions Weight Bearing Restrictions: No    Mobility  Bed Mobility Overal bed mobility: Needs Assistance Bed Mobility: Sidelying to Sit;Sit to Sidelying   Sidelying to sit: Mod assist Supine to sit: Mod assist Sit to supine: Mod assist Sit to sidelying: Mod assist General bed mobility comments: lifted trunk initially and legs to return to bed  Transfers Overall transfer level: Needs assistance Equipment used: Rolling walker (2 wheeled);1 person hand held assist Transfers: Sit to/from Stand Sit to Stand: Min assist Stand pivot transfers: Min guard       General transfer comment: requires a delay to remember to move L hand to walker  Ambulation/Gait Ambulation/Gait assistance: Min assist Gait Distance (Feet): 70 Feet Assistive device:  Rolling walker (2 wheeled) Gait Pattern/deviations: Step-to pattern;Step-through pattern;Decreased stride length;Wide base of support;Decreased weight shift to left Gait velocity: decreased Gait velocity interpretation: <1.8 ft/sec, indicate of risk for recurrent falls General Gait Details: requires care as she becomes buckly on LLE with gait on the hall at half way point   Stairs             Wheelchair Mobility    Modified Rankin (Stroke Patients Only)       Balance Overall balance assessment: Needs assistance Sitting-balance support: Feet supported Sitting balance-Leahy Scale: Fair Sitting balance - Comments: supervision   Standing balance support: Bilateral upper extremity supported;During functional activity Standing balance-Leahy Scale: Poor Standing balance comment: reliant on external support                            Cognition Arousal/Alertness: Awake/alert Behavior During Therapy: Flat affect Overall Cognitive Status: Impaired/Different from baseline Area of Impairment: Problem solving;Safety/judgement                         Safety/Judgement: Decreased awareness of safety;Decreased awareness of deficits Awareness: Intellectual Problem Solving: Slow processing;Requires verbal cues General Comments: Pt intermittently slow to process, other times is very sharp      Exercises      General Comments        Pertinent Vitals/Pain Pain Assessment: No/denies pain    Home Living  Prior Function            PT Goals (current goals can now be found in the care plan section) Acute Rehab PT Goals Patient Stated Goal: to leave the hospital Progress towards PT goals: Progressing toward goals    Frequency    Min 2X/week      PT Plan Current plan remains appropriate    Co-evaluation              AM-PAC PT "6 Clicks" Mobility   Outcome Measure  Help needed turning from your back to your  side while in a flat bed without using bedrails?: A Lot Help needed moving from lying on your back to sitting on the side of a flat bed without using bedrails?: A Lot Help needed moving to and from a bed to a chair (including a wheelchair)?: A Lot Help needed standing up from a chair using your arms (e.g., wheelchair or bedside chair)?: A Lot Help needed to walk in hospital room?: A Lot Help needed climbing 3-5 steps with a railing? : Total 6 Click Score: 11    End of Session Equipment Utilized During Treatment: Gait belt Activity Tolerance: Patient tolerated treatment well Patient left: in chair;with call bell/phone within reach;with chair alarm set Nurse Communication: Mobility status PT Visit Diagnosis: Hemiplegia and hemiparesis;Other abnormalities of gait and mobility (R26.89) Hemiplegia - Right/Left: Left Hemiplegia - dominant/non-dominant: Non-dominant Hemiplegia - caused by: Nontraumatic intracerebral hemorrhage     Time: 4580-9983 PT Time Calculation (min) (ACUTE ONLY): 19 min  Charges:  $Gait Training: 8-22 mins                     Ramond Dial 09/20/2018, 5:10 PM  Mee Hives, PT MS Acute Rehab Dept. Number: [place]

## 2018-09-21 LAB — GLUCOSE, CAPILLARY
Glucose-Capillary: 99 mg/dL (ref 70–99)
Glucose-Capillary: 85 mg/dL (ref 70–99)

## 2018-09-21 NOTE — Clinical Social Work Placement (Signed)
Nurse to call report to [phone]. Patient will be reporting to room 207, bed 2.   Please send CPAP mask and tubing with the patient. Charge nurse to ask for is Wells Guiles.   PTAR is scheduled for 1:00pm.   CLINICAL SOCIAL WORK PLACEMENT  NOTE  Date:  09/21/2018  Patient Details  Name: Diamond Rice MRN: [id number] Date of Birth: 1971/03/21  Clinical Social Work is seeking post-discharge placement for this patient at the [place] level of care (*CSW will initial, date and re-position this form in  chart as items are completed):  Yes   Patient/family provided with [place] Work Department's list of facilities offering this level of care within the geographic area requested by the patient (or if unable, by the patient's family).  Yes   Patient/family informed of their freedom to choose among providers that offer the needed level of care, that participate in Medicare, Medicaid or managed care program needed by the patient, have an available bed and are willing to accept the patient.  Yes   Patient/family informed of [facility]'s ownership interest in [facility], as well as of the fact that they are under no obligation to receive care at these facilities.  PASRR submitted to EDS on       PASRR number received on 09/12/18     Existing PASRR number confirmed on       FL2 transmitted to all facilities in geographic area requested by pt/family on 09/12/18     FL2 transmitted to all facilities within larger geographic area on 09/12/18     Patient informed that his/her managed care company has contracts with or will negotiate with certain facilities, including the following:        Yes   Patient/family informed of bed offers received.  Patient chooses bed at Other - please specify in the comment section below:([facility])     Physician recommends and patient chooses bed at      Patient to be transferred to Other -  please specify in the comment section below:([facility]) on 09/21/18.  Patient to be transferred to facility by PTAR     Patient family notified on 09/21/18 of transfer.  Name of family member notified:  [facility]     PHYSICIAN       Additional Comment:    _______________________________________________ Gelene Mink, LCSWA 09/21/2018, 10:43 AM

## 2018-09-21 NOTE — Progress Notes (Signed)
Attempted to call report to [employer]. Call transferred three times and then placed on hold for approximately 10 minutes. Even notified the supervisor, Wells Guiles, of the intent to call report. Wendee Copp

## 2018-09-21 NOTE — Progress Notes (Signed)
Physical Therapy Treatment Patient Details Name: Diamond Rice MRN: [id number] DOB: [birth year] Today's Date: 09/21/2018    History of Present Illness Pt adm with lt sided weakness and CT showed hemorrhagic infarct arising in the left posterior lentiform nucleus. PMH:  rt CVA, lt transmet amputation 05/28/18, Sleep apnea, COPD, smoker, DM, Bipolar, HTN, anxiety. Pt with recent SNF stay from Oct - Dec 31st after transmetatarsal amputation prior to this admission.     PT Comments    Patient progressing with therapy, less assistance to sit up today now Min A. Stand pivot to chair with +1 assistant min A. Will be able to work on ambulation with close chair follow next visit. Plans to go to SNF today, agree with this plan.     Follow Up Recommendations  SNF     Equipment Recommendations  None recommended by PT    Recommendations for Other Services Rehab consult     Precautions / Restrictions Precautions Precautions: Fall Restrictions Weight Bearing Restrictions: No    Mobility  Bed Mobility Overal bed mobility: Needs Assistance Bed Mobility: Sidelying to Sit;Sit to Sidelying       Sit to supine: Min assist      Transfers Overall transfer level: Needs assistance Equipment used: Rolling walker (2 wheeled);1 person hand held assist Transfers: Sit to/from Stand Sit to Stand: Min assist Stand pivot transfers: Min guard          Ambulation/Gait                 Stairs             Wheelchair Mobility    Modified Rankin (Stroke Patients Only) Modified Rankin (Stroke Patients Only) Pre-Morbid Rankin Score: Moderate disability Modified Rankin: Moderately severe disability     Balance Overall balance assessment: Needs assistance Sitting-balance support: Feet supported Sitting balance-Leahy Scale: Fair Sitting balance - Comments: supervision     Standing balance-Leahy Scale: Poor                              Cognition  Arousal/Alertness: Awake/alert Behavior During Therapy: Flat affect Overall Cognitive Status: Impaired/Different from baseline Area of Impairment: Problem solving;Safety/judgement                         Safety/Judgement: Decreased awareness of safety;Decreased awareness of deficits Awareness: Intellectual Problem Solving: Slow processing;Requires verbal cues General Comments: Pt intermittently slow to process, other times is very sharp      Exercises      General Comments        Pertinent Vitals/Pain      Home Living                      Prior Function            PT Goals (current goals can now be found in the care plan section) Acute Rehab PT Goals Patient Stated Goal: to leave the hospital PT Goal Formulation: With patient Time For Goal Achievement: 10/01/18 Potential to Achieve Goals: Good Progress towards PT goals: Progressing toward goals    Frequency    Min 2X/week      PT Plan Current plan remains appropriate    Co-evaluation              AM-PAC PT "6 Clicks" Mobility   Outcome Measure  Help needed turning from your back to your side while in  a flat bed without using bedrails?: A Lot Help needed moving from lying on your back to sitting on the side of a flat bed without using bedrails?: A Lot Help needed moving to and from a bed to a chair (including a wheelchair)?: A Lot Help needed standing up from a chair using your arms (e.g., wheelchair or bedside chair)?: A Lot Help needed to walk in hospital room?: A Lot Help needed climbing 3-5 steps with a railing? : Total 6 Click Score: 11    End of Session Equipment Utilized During Treatment: Gait belt Activity Tolerance: Patient tolerated treatment well Patient left: in chair;with call bell/phone within reach;with chair alarm set Nurse Communication: Mobility status PT Visit Diagnosis: Hemiplegia and hemiparesis;Other abnormalities of gait and mobility (R26.89) Hemiplegia -  Right/Left: Left Hemiplegia - dominant/non-dominant: Non-dominant Hemiplegia - caused by: Nontraumatic intracerebral hemorrhage     Time: 0922-0945 PT Time Calculation (min) (ACUTE ONLY): 23 min  Charges:  $Therapeutic Activity: 8-22 mins                     Reinaldo Berber, PT, DPT Acute Rehabilitation Services Pager: [phone] Office: [phone]     Reinaldo Berber 09/21/2018, 10:13 AM

## 2018-09-21 NOTE — Progress Notes (Signed)
Patient will DC to: [place] Anticipated DC date: 09/21/2018 Family notified: Yes Transport by: Corey Harold   Per MD patient ready for DC to . RN, patient, patient's family, and facility notified of DC. Discharge Summary and FL2 sent to facility. RN to call report prior to discharge [phone]). DC packet on chart. Ambulance transport requested for patient.   CSW will sign off for now as social work intervention is no longer needed. Please consult [place] again if new needs arise.  Geet Hosking, LCSW-A /Clinical Social Work Department Cell: [phone]

## 2018-09-30 ENCOUNTER — Ambulatory Visit: Payer: Self-pay | Admitting: Family

## 2018-09-30 ENCOUNTER — Encounter (HOSPITAL_COMMUNITY): Payer: Self-pay

## 2018-10-18 ENCOUNTER — Encounter: Payer: Self-pay | Admitting: Internal Medicine

## 2018-11-05 ENCOUNTER — Inpatient Hospital Stay: Payer: Medicare Other | Admitting: Neurology

## 2018-11-14 DIAGNOSIS — I82409 Acute embolism and thrombosis of unspecified deep veins of unspecified lower extremity: Secondary | ICD-10-CM | POA: Insufficient documentation

## 2018-11-14 DIAGNOSIS — J309 Allergic rhinitis, unspecified: Secondary | ICD-10-CM | POA: Insufficient documentation

## 2018-11-14 DIAGNOSIS — F17200 Nicotine dependence, unspecified, uncomplicated: Secondary | ICD-10-CM | POA: Insufficient documentation

## 2018-11-26 DIAGNOSIS — R600 Localized edema: Secondary | ICD-10-CM | POA: Insufficient documentation

## 2018-11-26 DIAGNOSIS — R32 Unspecified urinary incontinence: Secondary | ICD-10-CM | POA: Insufficient documentation

## 2018-11-26 DIAGNOSIS — R630 Anorexia: Secondary | ICD-10-CM | POA: Insufficient documentation

## 2018-12-02 ENCOUNTER — Other Ambulatory Visit (HOSPITAL_BASED_OUTPATIENT_CLINIC_OR_DEPARTMENT_OTHER): Payer: Self-pay

## 2018-12-02 DIAGNOSIS — R0683 Snoring: Secondary | ICD-10-CM

## 2018-12-02 DIAGNOSIS — G4733 Obstructive sleep apnea (adult) (pediatric): Secondary | ICD-10-CM

## 2018-12-02 DIAGNOSIS — R5383 Other fatigue: Secondary | ICD-10-CM

## 2018-12-02 DIAGNOSIS — G4710 Hypersomnia, unspecified: Secondary | ICD-10-CM

## 2019-01-31 ENCOUNTER — Encounter (HOSPITAL_BASED_OUTPATIENT_CLINIC_OR_DEPARTMENT_OTHER): Payer: Self-pay

## 2019-02-11 ENCOUNTER — Emergency Department (HOSPITAL_COMMUNITY)
Admission: EM | Admit: 2019-02-11 | Discharge: 2019-02-11 | Disposition: A | Discharge status: Home / Self Care | Payer: Medicare Other | Attending: Emergency Medicine | Admitting: Emergency Medicine

## 2019-02-11 ENCOUNTER — Other Ambulatory Visit: Payer: Self-pay

## 2019-02-11 DIAGNOSIS — Z79899 Other long term (current) drug therapy: Secondary | ICD-10-CM | POA: Insufficient documentation

## 2019-02-11 DIAGNOSIS — R112 Nausea with vomiting, unspecified: Secondary | ICD-10-CM | POA: Insufficient documentation

## 2019-02-11 DIAGNOSIS — I10 Essential (primary) hypertension: Secondary | ICD-10-CM | POA: Insufficient documentation

## 2019-02-11 DIAGNOSIS — Z8673 Personal history of transient ischemic attack (TIA), and cerebral infarction without residual deficits: Secondary | ICD-10-CM | POA: Insufficient documentation

## 2019-02-11 DIAGNOSIS — E119 Type 2 diabetes mellitus without complications: Secondary | ICD-10-CM | POA: Insufficient documentation

## 2019-02-11 DIAGNOSIS — Z794 Long term (current) use of insulin: Secondary | ICD-10-CM | POA: Insufficient documentation

## 2019-02-11 DIAGNOSIS — J449 Chronic obstructive pulmonary disease, unspecified: Secondary | ICD-10-CM | POA: Diagnosis not present

## 2019-02-11 DIAGNOSIS — Z7982 Long term (current) use of aspirin: Secondary | ICD-10-CM | POA: Diagnosis not present

## 2019-02-11 DIAGNOSIS — Z87891 Personal history of nicotine dependence: Secondary | ICD-10-CM | POA: Insufficient documentation

## 2019-02-11 LAB — CBC WITH DIFFERENTIAL/PLATELET
Abs Immature Granulocytes: 0.04 10*3/uL (ref 0.00–0.07)
Basophils Absolute: 0 10*3/uL (ref 0.0–0.1)
Basophils Relative: 0 %
Eosinophils Absolute: 0 10*3/uL (ref 0.0–0.5)
Eosinophils Relative: 0 %
HCT: 39.5 % (ref 36.0–46.0)
Hemoglobin: 12.4 g/dL (ref 12.0–15.0)
Immature Granulocytes: 0 %
Lymphocytes Relative: 20 %
Lymphs Abs: 1.9 10*3/uL (ref 0.7–4.0)
MCH: 22.8 pg — ABNORMAL LOW (ref 26.0–34.0)
MCHC: 31.4 g/dL (ref 30.0–36.0)
MCV: 72.7 fL — ABNORMAL LOW (ref 80.0–100.0)
Monocytes Absolute: 0.6 10*3/uL (ref 0.1–1.0)
Monocytes Relative: 6 %
Neutro Abs: 6.8 10*3/uL (ref 1.7–7.7)
Neutrophils Relative %: 74 %
Platelets: 299 10*3/uL (ref 150–400)
RBC: 5.43 MIL/uL — ABNORMAL HIGH (ref 3.87–5.11)
RDW: 16.4 % — ABNORMAL HIGH (ref 11.5–15.5)
WBC: 9.4 10*3/uL (ref 4.0–10.5)
nRBC: 0 % (ref 0.0–0.2)

## 2019-02-11 LAB — COMPREHENSIVE METABOLIC PANEL
ALT: 26 U/L (ref 0–44)
AST: 65 U/L — ABNORMAL HIGH (ref 15–41)
Albumin: 3.7 g/dL (ref 3.5–5.0)
Alkaline Phosphatase: 70 U/L (ref 38–126)
Anion gap: 10 (ref 5–15)
BUN: 11 mg/dL (ref 6–20)
CO2: 28 mmol/L (ref 22–32)
Calcium: 9.4 mg/dL (ref 8.9–10.3)
Chloride: 102 mmol/L (ref 98–111)
Creatinine, Ser: 1.39 mg/dL — ABNORMAL HIGH (ref 0.44–1.00)
GFR calc Af Amer: 53 mL/min — ABNORMAL LOW (ref >60)
GFR calc non Af Amer: 45 mL/min — ABNORMAL LOW (ref >60)
Glucose, Bld: 145 mg/dL — ABNORMAL HIGH (ref 70–99)
Potassium: 5 mmol/L (ref 3.5–5.1)
Sodium: 140 mmol/L (ref 135–145)
Total Bilirubin: 0.6 mg/dL (ref 0.3–1.2)
Total Protein: 7.7 g/dL (ref 6.5–8.1)

## 2019-02-11 LAB — CBG MONITORING, ED: Glucose-Capillary: 148 mg/dL — ABNORMAL HIGH (ref 70–99)

## 2019-02-11 LAB — LIPASE, BLOOD: Lipase: 30 U/L (ref 11–51)

## 2019-02-11 MED ORDER — CLONIDINE HCL 0.2 MG PO TABS
0.3000 mg | ORAL_TABLET | Freq: Once | ORAL | Status: AC
  Administered 2019-02-11: 14:00:00 0.3 mg via ORAL
  Filled 2019-02-11: qty 1

## 2019-02-11 MED ORDER — HYDRALAZINE HCL 25 MG PO TABS
50.0000 mg | ORAL_TABLET | Freq: Once | ORAL | Status: AC
  Administered 2019-02-11: 14:00:00 50 mg via ORAL
  Filled 2019-02-11: qty 2

## 2019-02-11 MED ORDER — ONDANSETRON HCL 4 MG/2ML IJ SOLN
4.0000 mg | Freq: Once | INTRAMUSCULAR | Status: AC
  Administered 2019-02-11: 4 mg via INTRAVENOUS
  Filled 2019-02-11: qty 2

## 2019-02-11 MED ORDER — ONDANSETRON HCL 4 MG/2ML IJ SOLN
4.0000 mg | Freq: Once | INTRAMUSCULAR | Status: AC
  Administered 2019-02-11: 12:00:00 4 mg via INTRAVENOUS
  Filled 2019-02-11: qty 2

## 2019-02-11 MED ORDER — ONDANSETRON HCL 4 MG PO TABS: 4.0000 mg | ORAL_TABLET | Freq: Four times a day (QID) | ORAL | 0 refills | Status: DC

## 2019-02-11 MED ORDER — ACETAMINOPHEN 650 MG RE SUPP
650.0000 mg | Freq: Once | RECTAL | Status: AC
  Administered 2019-02-11: 12:00:00 650 mg via RECTAL
  Filled 2019-02-11: qty 1

## 2019-02-11 NOTE — ED Triage Notes (Signed)
Fever Nausea vomiting and abdominal pain.  Left sided deficits are normal from previous stroke

## 2019-02-11 NOTE — ED Notes (Signed)
Discharge instructions and prescription discussed with Pt. Pt verbalized understanding. Pt stable and leaving via stretcher with PTAR

## 2019-02-11 NOTE — ED Provider Notes (Signed)
[facility] EMERGENCY DEPARTMENT Provider Note   CSN: [id number] Arrival date & time: 02/11/19  1053    History   Chief Complaint Chief Complaint  Patient presents with  . Abdominal Pain    HPI Diamond Rice is a 48 y.o. female.     HPI   48 year old female, with a PMH of COPD, CVA with residual left-sided deficits, HTN, HLD, diabetes, presents with nausea and vomiting since last night.  Patient states symptoms started suddenly last night and she has had numerous to count episodes of vomiting.  She notes associated nausea and mild epigastric abdominal pain.  She states abdominal pain is only cramping, nonradiating.  She states a fever which started today.  She denies any chest pain, shortness of breath, cough.  She denies any known sick contacts.  Past Medical History:  Diagnosis Date  . Anxiety   . Chronic lower back pain 04/17/2012   "just got over some; catched when I walked"  . COPD (chronic obstructive pulmonary disease) ([place])   . Critical lower limb ischemia 05/27/2018  . Depression   . Headache(784.0) 04/17/2012   "~ qod; lately waking up in am w/one"  . High cholesterol   . Hypertension   . Migraines 04/17/2012  . Obesity   . Sleep apnea    CPAP  . Stroke ([place])   . Type II diabetes mellitus ([place]) 08/28/2002    Patient Active Problem List   Diagnosis Date Noted  . Hemorrhagic stroke ([place])   . Diastolic dysfunction   . Diabetes mellitus type 2 in obese ([place])   . Anemia of chronic disease   . Chronic obstructive pulmonary disease ([place])   . ICH (intracerebral hemorrhage) ([place]) 08/30/2018  . Hypertensive heart disease without heart failure 07/04/2018  . Nonrheumatic aortic valve insufficiency 07/04/2018  . Esophageal abnormality   . Class 2 severe obesity due to excess calories with serious comorbidity and body mass index (BMI) of 35.0 to 35.9 in adult [facility])   . Peripheral vascular disease ([place])   . History of osteomyelitis   . History of  amputation of lesser toe ([place])   . Pulmonary embolism ([place]) 06/22/2018  . Microcytic anemia 06/17/2018  . At risk for adverse drug reaction 06/11/2018  . Critical lower limb ischemia 05/27/2018  . GI bleeding 04/02/2018  . Acute renal failure (ARF) ([place]) 04/02/2018  . Hypokalemia 04/02/2018  . Polysubstance abuse ([place]) 04/02/2018  . Diabetic foot ulcer ([place]) 04/02/2018  . Cannabis use disorder, moderate, dependence ([place]) 05/19/2017  . Cocaine use disorder, moderate, dependence ([place]) 05/19/2017  . Schizoaffective disorder, bipolar type ([place]) 10/07/2015  . OSA (obstructive sleep apnea) 06/09/2015  . Vitamin D deficiency 01/06/2015  . Bilateral knee pain 01/05/2015  . Numbness and tingling in right hand 01/05/2015  . Insomnia 12/07/2014  . Right-sided lacunar infarction ([place]) 09/15/2014  . Left hemiparesis ([place]) 09/15/2014  . Dyspnea   . Back pain 09/11/2013  . Psychoactive substance-induced organic mood disorder ([place]) 05/28/2013  . Cocaine abuse with cocaine-induced mood disorder ([place]) 05/28/2013  . Cannabis dependence with cannabis-induced anxiety disorder ([place]) 05/28/2013  . Morbid obesity with BMI of 45.0-49.9, adult ([place]) 04/25/2012  . Chest pain 04/17/2012  . Hypertension 04/17/2012  . DM (diabetes mellitus) with peripheral vascular complication ([place]) 70/35/0093  . Hyperlipidemia 04/17/2012  . Tobacco use 04/17/2012    Past Surgical History:  Procedure Laterality Date  . ABDOMINAL AORTOGRAM W/LOWER EXTREMITY Left 05/27/2018   Procedure: ABDOMINAL AORTOGRAM W/LOWER EXTREMITY Run[place] and  Possible Intervention;  Surgeon: Waynetta Sandy, MD;  Location: [facility] CV LAB;  Service: Cardiovascular;  Laterality: Left;  . APPLICATION OF WOUND VAC Left 06/06/2018   Procedure: APPLICATION OF WOUND VAC;  Surgeon: Waynetta Sandy, MD;  Location: [facility];  Service: Vascular;  Laterality: Left;  . BIOPSY  04/04/2018   Procedure: BIOPSY;  Surgeon: [name], MD;   Location: [facility] ENDOSCOPY;  Service: Endoscopy;;  . CARDIAC CATHETERIZATION  ~ 2011  . COLONOSCOPY N/A 04/04/2018   Procedure: COLONOSCOPY;  Surgeon: [name], MD;  Location: [facility] ENDOSCOPY;  Service: Endoscopy;  Laterality: N/A;  . LOWER EXTREMITY ANGIOGRAM Left 05/27/2018  . PERIPHERAL VASCULAR INTERVENTION Left 05/27/2018   Procedure: PERIPHERAL VASCULAR INTERVENTION;  Surgeon: Waynetta Sandy, MD;  Location: [facility] CV LAB;  Service: Cardiovascular;  Laterality: Left;  . POLYPECTOMY  04/04/2018   Procedure: POLYPECTOMY;  Surgeon: [name], MD;  Location: [facility] ENDOSCOPY;  Service: Endoscopy;;  . TRANSMETATARSAL AMPUTATION Left 05/28/2018   Procedure: AMPUTATION TOES THREE, FOUR AND FIVE on left foot;  Surgeon: Waynetta Sandy, MD;  Location: [facility];  Service: Vascular;  Laterality: Left;  . WOUND DEBRIDEMENT Left 06/06/2018   Procedure: DEBRIDEMENT WOUND LEFT FOOT;  Surgeon: Waynetta Sandy, MD;  Location: [facility];  Service: Vascular;  Laterality: Left;     OB History   No obstetric history on file.      Home Medications    Prior to Admission medications   Medication Sig Start Date End Date Taking? Authorizing Provider  ARIPiprazole (ABILIFY) 15 MG tablet Take 1 tablet (15 mg total) by mouth at bedtime. 08/26/18   Medina-Vargas, Monina C, NP  aspirin EC 81 MG EC tablet Take 1 tablet (81 mg total) by mouth daily. 09/21/18   Donzetta Starch, NP  cloNIDine (CATAPRES) 0.3 MG tablet Take 1 tablet (0.3 mg total) by mouth 3 (three) times daily. 08/26/18   Medina-Vargas, Monina C, NP  feeding supplement, GLUCERNA SHAKE, (GLUCERNA SHAKE) LIQD Take 237 mLs by mouth 3 (three) times daily between meals. 09/20/18   Donzetta Starch, NP  ferrous sulfate 325 (65 FE) MG tablet Take 1 tablet (325 mg total) by mouth daily with breakfast. 08/26/18   Medina-Vargas, Monina C, NP  furosemide (LASIX) 40 MG tablet Take 1 tablet (40 mg total) by mouth daily. 08/26/18   Medina-Vargas,  Monina C, NP  gabapentin (NEURONTIN) 100 MG capsule Take 1 capsule (100 mg total) by mouth 2 (two) times daily. 09/20/18   Donzetta Starch, NP  hydrALAZINE (APRESOLINE) 50 MG tablet Take 1 tablet (50 mg total) by mouth 4 (four) times daily. 08/26/18   Medina-Vargas, Monina C, NP  Insulin Detemir (LEVEMIR) 100 UNIT/ML Pen Inject 15 Units into the skin 2 (two) times daily. Patient taking differently: Inject 15 Units into the skin See admin instructions. Inject 15 units into the skin two times a day- morning and bedtime 08/26/18   Medina-Vargas, Monina C, NP  lip balm (CARMEX) ointment Apply topically as needed for lip care. 09/20/18   Donzetta Starch, NP  lisinopril (PRINIVIL,ZESTRIL) 30 MG tablet Take 1 tablet (30 mg total) by mouth daily. 08/26/18   Medina-Vargas, Monina C, NP  Melatonin 3 MG TABS Take 3 mg by mouth at bedtime.     [provider]  Menthol-Methyl Salicylate (MUSCLE RUB) 10-15 % CREA Apply 1 application topically every 6 (six) hours as needed for muscle pain. 09/20/18   Donzetta Starch, NP  metoprolol tartrate (  LOPRESSOR) 25 MG tablet Take 1 tablet (25 mg total) by mouth 2 (two) times daily. 08/26/18   Medina-Vargas, Monina C, NP  ondansetron (ZOFRAN) 4 MG tablet Take 1 tablet (4 mg total) by mouth every 6 (six) hours. 02/11/19   Quintel Mccalla S, PA-C  pentoxifylline (TRENTAL) 400 MG CR tablet Take 1 tablet (400 mg total) by mouth 3 (three) times daily with meals. 08/26/18   Medina-Vargas, Monina C, NP  polyethylene glycol (MIRALAX / GLYCOLAX) packet Take 17 g by mouth daily as needed for mild constipation. 09/20/18   Donzetta Starch, NP  potassium chloride SA (K-DUR,KLOR-CON) 20 MEQ tablet Take 1 tablet (20 mEq total) by mouth daily. 08/26/18   Medina-Vargas, Monina C, NP  senna-docusate (SENOKOT-S) 8.6-50 MG tablet Take 1 tablet by mouth at bedtime. 09/20/18   Donzetta Starch, NP  silver sulfADIAZINE (SILVADENE) 1 % cream Apply topically daily. 09/21/18   Donzetta Starch, NP     Family History Family History  Problem Relation Age of Onset  . Stroke Brother 76  . Stroke Brother 43  . Heart attack Mother 27  . Stroke Father 79    Social History Social History   Tobacco Use  . Smoking status: Former Smoker    Packs/day: 0.25    Years: 28.00    Pack years: 7.00    Types: Cigarettes  . Smokeless tobacco: Never Used  Substance Use Topics  . Alcohol use: No    Alcohol/week: 0.0 standard drinks    Comment: rare  . Drug use: Yes    Types: Marijuana, "Crack" cocaine    Comment: marijuana last use days ago; last cocaine several months ago     Allergies   Morphine and related and Metformin   Review of Systems Review of Systems  Constitutional: Positive for chills and fever.  Respiratory: Negative for shortness of breath.   Cardiovascular: Negative for chest pain.  Gastrointestinal: Positive for abdominal pain, nausea and vomiting.  Genitourinary: Negative for dysuria and frequency.  All other systems reviewed and are negative.    Physical Exam Updated Vital Signs BP (!) 180/116   Pulse 91   Temp 99.3 F (37.4 C) (Oral)   Resp 19   Ht 5\' 1"  (1.549 m)   Wt 88.5 kg   SpO2 100%   BMI 36.84 kg/m   Physical Exam Vitals signs and nursing note reviewed.  Constitutional:      Appearance: She is well-developed.  HENT:     Head: Normocephalic and atraumatic.  Eyes:     Conjunctiva/sclera: Conjunctivae normal.  Neck:     Musculoskeletal: Neck supple.  Cardiovascular:     Rate and Rhythm: Normal rate and regular rhythm.     Heart sounds: Normal heart sounds. No murmur.  Pulmonary:     Effort: Pulmonary effort is normal. No respiratory distress.     Breath sounds: Normal breath sounds. No wheezing or rales.  Abdominal:     General: Bowel sounds are normal. There is no distension.     Palpations: Abdomen is soft.     Tenderness: There is no abdominal tenderness.     Comments: Epigastric region nontender palpation with light and deep  palpation  Musculoskeletal: Normal range of motion.        General: No tenderness or deformity.  Skin:    General: Skin is warm and dry.     Findings: No erythema or rash.  Neurological:     Mental Status: She is alert and  oriented to person, place, and time.  Psychiatric:        Behavior: Behavior normal.      ED Treatments / Results  Labs (all labs ordered are listed, but only abnormal results are displayed) Labs Reviewed  CBC WITH DIFFERENTIAL/PLATELET - Abnormal; Notable for the following components:      Result Value   RBC 5.43 (*)    MCV 72.7 (*)    MCH 22.8 (*)    RDW 16.4 (*)    All other components within normal limits  COMPREHENSIVE METABOLIC PANEL - Abnormal; Notable for the following components:   Glucose, Bld 145 (*)    Creatinine, Ser 1.39 (*)    AST 65 (*)    GFR calc non Af Amer 45 (*)    GFR calc Af Amer 53 (*)    All other components within normal limits  CBG MONITORING, ED - Abnormal; Notable for the following components:   Glucose-Capillary 148 (*)    All other components within normal limits  LIPASE, BLOOD    EKG EKG Interpretation  Date/Time:  Tuesday February 11 2019 11:12:34 EDT Ventricular Rate:  88 PR Interval:    QRS Duration: 105 QT Interval:  373 QTC Calculation: 452 R Axis:   36 Text Interpretation:  Sinus rhythm Anterior infarct, old Nonspecific T abnormalities, lateral leads since last tracing no significant change Confirmed by Daleen Bo [phone]) on 02/11/2019 12:21:14 PM   Radiology No results found.  Procedures Procedures (including critical care time)  Medications Ordered in ED Medications  ondansetron (ZOFRAN) injection 4 mg (4 mg Intravenous Given 02/11/19 1130)  acetaminophen (TYLENOL) suppository 650 mg (650 mg Rectal Given 02/11/19 1130)  cloNIDine (CATAPRES) tablet 0.3 mg (0.3 mg Oral Given 02/11/19 1332)  hydrALAZINE (APRESOLINE) tablet 50 mg (50 mg Oral Given 02/11/19 1331)  ondansetron (ZOFRAN) injection 4 mg (4 mg  Intravenous Given 02/11/19 1429)     Initial Impression / Assessment and Plan / ED Course  I have reviewed the triage vital signs and the nursing notes.  Pertinent labs & imaging results that were available during my care of the patient were reviewed by me and considered in my medical decision making (see chart for details).  Clinical Course as of Feb 11 1516  Tue Feb 11, 2019  1243 Reexamined.  She is feeling significantly better.  Her abdomen is soft and nontender to palpation.  She states her nausea has improved.  She has not taken her home blood pressure medication.  She states she would like to try them p.o. at this time.   [CK]  1509 She continues to feel better.  She is p.o. tolerant to water and graham crackers.  Her blood pressure has improved with oral medications.   [CK]    Clinical Course User Index [CK] Etter Sjogren, PA-C       Patient present with nausea, vomiting and fever.  Temp 101.2 by EMS.  She noted some mild epigastric pain on arrival.  She was given Zofran and on reevaluation her abdomen is soft and nontender to palpation.  She is feeling better, p.o. tolerant to water and crackers.  Her blood pressure has improved with her home medication.  No evidence of acute abdomen on physical exam.  Vital signs stable.  Blood work is reassuring, no evidence of DKA.  Her lipase is normal.  Likely a viral gastroenteritis.  Will discharge on Zofran.  Encourage good p.o. intake.  Of note patient on 1 SPO2 is 76%,  however feel that this is not accurate given patient's O2 has been at 95% or greater on all other vitals.    At this time there does not appear to be any evidence of an acute emergency medical condition and the patient appears stable for discharge with appropriate outpatient follow up.Diagnosis was discussed with patient who verbalizes understanding and is agreeable to discharge. Pt case discussed with Dr. Eulis Foster who agrees with my plan.  Final Clinical Impressions(s)  / ED Diagnoses   Final diagnoses:  Non-intractable vomiting with nausea, unspecified vomiting type    ED Discharge Orders         Ordered    ondansetron (ZOFRAN) 4 MG tablet  Every 6 hours     02/11/19 1512           Etter Sjogren, PA-C 02/11/19 2006    Daleen Bo, MD 02/12/19 [phone]

## 2019-02-11 NOTE — ED Notes (Signed)
Called [facility] for pt transport  

## 2019-02-11 NOTE — Discharge Instructions (Addendum)
Take Zofran as needed for nausea.  Drink plenty of fluids.  Follow-up with your primary care provider in 2 days for continued valuation.  Return to the ED immediately for new or worsening symptoms or concerns, such as abdominal pain, uncontrolled vomiting, chest pain, shortness of breath or any concerns at all.

## 2019-02-11 NOTE — ED Provider Notes (Signed)
  Face-to-face evaluation   History: Presents for evaluation of nausea, vomiting and fever, which started last night.  She denies blood in emesis.  She denies cough, chest pain, weakness or dizziness.  She denies known exposure to Covid-19.  Physical exam: Obese, alert and cooperative.  No respiratory distress.  Respiratory rate normal.  Abdomen is soft nontender.  There is no abdominal distention.  Medical screening examination/treatment/procedure(s) were conducted as a shared visit with non-physician practitioner(s) and myself.  I personally evaluated the patient during the encounter    Daleen Bo, MD 02/12/19 [phone]

## 2019-02-17 ENCOUNTER — Other Ambulatory Visit (HOSPITAL_COMMUNITY)
Admission: RE | Admit: 2019-02-17 | Discharge: 2019-02-17 | Disposition: A | Discharge status: Home / Self Care | Payer: Medicare Other | Source: Ambulatory Visit | Attending: Internal Medicine | Admitting: Internal Medicine

## 2019-02-17 DIAGNOSIS — Z1159 Encounter for screening for other viral diseases: Secondary | ICD-10-CM | POA: Insufficient documentation

## 2019-02-17 LAB — SARS CORONAVIRUS 2 (TAT 6-24 HRS): SARS Coronavirus 2: NEGATIVE

## 2019-02-19 ENCOUNTER — Other Ambulatory Visit: Payer: Self-pay

## 2019-02-19 ENCOUNTER — Ambulatory Visit (HOSPITAL_BASED_OUTPATIENT_CLINIC_OR_DEPARTMENT_OTHER): Payer: Medicare Other | Attending: Internal Medicine | Admitting: Internal Medicine

## 2019-02-19 DIAGNOSIS — R5383 Other fatigue: Secondary | ICD-10-CM

## 2019-02-19 DIAGNOSIS — G4733 Obstructive sleep apnea (adult) (pediatric): Secondary | ICD-10-CM | POA: Insufficient documentation

## 2019-02-19 DIAGNOSIS — R0683 Snoring: Secondary | ICD-10-CM

## 2019-02-19 DIAGNOSIS — G4710 Hypersomnia, unspecified: Secondary | ICD-10-CM

## 2019-02-22 DIAGNOSIS — R0683 Snoring: Secondary | ICD-10-CM | POA: Diagnosis not present

## 2019-02-22 NOTE — Procedures (Signed)
[name], Diamond Rice Date: 02/19/2019 Gender: Female D.O.B: September 27, 1970 Age (years): 55 Referring Provider: Latanya Presser MD Height (inches): 61 Interpreting Physician: Baird Lyons MD, ABSM Weight (lbs): 195 RPSGT: Carolin Coy BMI: 37 MRN: [id number] Neck Size: 14.50  CLINICAL INFORMATION Sleep Study Type: Split Night CPAP Indication for sleep study: Excessive Daytime Sleepiness, Fatigue, Hypertension, Obesity, OSA, Snoring Epworth Sleepiness Score: 5  SLEEP STUDY TECHNIQUE As per the AASM Manual for the Scoring of Sleep and Associated Events v2.3 (April 2016) with a hypopnea requiring 4% desaturations.  The channels recorded and monitored were frontal, central and occipital EEG, electrooculogram (EOG), submentalis EMG (chin), nasal and oral airflow, thoracic and abdominal wall motion, anterior tibialis EMG, snore microphone, electrocardiogram, and pulse oximetry. Continuous positive airway pressure (CPAP) was initiated when the patient met split night criteria and was titrated according to treat sleep-disordered breathing.  MEDICATIONS Medications self-administered by patient taken the night of the study : ABILIFY, CLONIDINE, GABAPENTIN, HYDRALAZINE  RESPIRATORY PARAMETERS Diagnostic  Total AHI (/hr): 19.5 RDI (/hr): 55.2 OA Index (/hr): 3.6 CA Index (/hr): 0.8 REM AHI (/hr): N/A NREM AHI (/hr): 19.5 Supine AHI (/hr): N/A Non-supine AHI (/hr): 19.5 Min O2 Sat (%): 93.0 Mean O2 (%): 95.9 Time below 88% (min): 0   Titration  Optimal Pressure (cm): 17 AHI at Optimal Pressure (/hr): 0.0 Min O2 at Optimal Pressure (%): 98.0 Supine % at Optimal (%): 100 Sleep % at Optimal (%): 98   SLEEP ARCHITECTURE The recording time for the entire night was 413.1 minutes.  During a baseline period of 173.8 minutes, the patient slept for 151.0 minutes in REM and nonREM, yielding a sleep efficiency of 86.9%%. Sleep onset after lights out was 6.8 minutes with a REM latency  of N/A minutes. The patient spent 21.2%% of the night in stage N1 sleep, 78.8%% in stage N2 sleep, 0.0%% in stage N3 and 0% in REM.  During the titration period of 230.4 minutes, the patient slept for 208.0 minutes in REM and nonREM, yielding a sleep efficiency of 90.3%%. Sleep onset after CPAP initiation was 8.1 minutes with a REM latency of N/A minutes. The patient spent 26.9%% of the night in stage N1 sleep, 73.1%% in stage N2 sleep, 0.0%% in stage N3 and 0% in REM.  CARDIAC DATA The 2 lead EKG demonstrated sinus rhythm. The mean heart rate was 100.0 beats per minute. Other EKG findings include: None.  LEG MOVEMENT DATA The total Periodic Limb Movements of Sleep (PLMS) were 0. The PLMS index was 0.0 .  IMPRESSIONS - Moderate obstructive sleep apnea occurred during the diagnostic portion of the study(AHI = 19.5/hour). An optimal PAP pressure was selected for this patient ( 17 cm of water) - No significant central sleep apnea occurred during the diagnostic portion of the study (CAI = 0.8/hour). - The patient had minimal or no oxygen desaturation during the diagnostic portion of the study (Min O2 = 93.0%) - The patient snored with soft snoring volume during the diagnostic portion of the study. - No cardiac abnormalities were noted during this study. - Clinically significant periodic limb movements did not occur during sleep.  DIAGNOSIS - Obstructive Sleep Apnea (327.23 [G47.33 ICD-10])  RECOMMENDATIONS - Trial of CPAP therapy on 17 cm H2O or autopap 10-20.  - Patient used a Small size Fisher&Paykel Full Face Mask F&P Vitera (new) mask and heated humidification. - Be careful with alcohol, sedatives and other CNS depressants that may worsen sleep apnea and disrupt normal sleep architecture. -  Sleep hygiene should be reviewed to assess factors that may improve sleep quality. - Weight management and regular exercise should be initiated or continued.  [Electronically signed] 02/22/2019 12:12  PM    MD, ABSM Diplomate, American Board of Sleep Medicine   NPI: [id number]                            Diplomate, American Board of Sleep Medicine  ELECTRONICALLY SIGNED ON:  02/22/2019, 12:06 PM [facility] PH: [phone]   FX: [phone] ACCREDITED BY THE AMERICAN ACADEMY OF SLEEP MEDICINE 

## 2019-05-26 ENCOUNTER — Emergency Department (HOSPITAL_COMMUNITY)
Admission: EM | Admit: 2019-05-26 | Discharge: 2019-05-27 | Disposition: A | Discharge status: Home / Self Care | Payer: Medicare Other | Attending: Emergency Medicine | Admitting: Emergency Medicine

## 2019-05-26 ENCOUNTER — Emergency Department (HOSPITAL_COMMUNITY): Payer: Medicare Other

## 2019-05-26 ENCOUNTER — Other Ambulatory Visit: Payer: Self-pay

## 2019-05-26 ENCOUNTER — Encounter (HOSPITAL_COMMUNITY): Payer: Self-pay

## 2019-05-26 DIAGNOSIS — M1711 Unilateral primary osteoarthritis, right knee: Secondary | ICD-10-CM | POA: Diagnosis not present

## 2019-05-26 DIAGNOSIS — J449 Chronic obstructive pulmonary disease, unspecified: Secondary | ICD-10-CM | POA: Diagnosis not present

## 2019-05-26 DIAGNOSIS — Z7982 Long term (current) use of aspirin: Secondary | ICD-10-CM | POA: Insufficient documentation

## 2019-05-26 DIAGNOSIS — Z794 Long term (current) use of insulin: Secondary | ICD-10-CM | POA: Diagnosis not present

## 2019-05-26 DIAGNOSIS — Z79899 Other long term (current) drug therapy: Secondary | ICD-10-CM | POA: Diagnosis not present

## 2019-05-26 DIAGNOSIS — I10 Essential (primary) hypertension: Secondary | ICD-10-CM | POA: Diagnosis not present

## 2019-05-26 DIAGNOSIS — Z87891 Personal history of nicotine dependence: Secondary | ICD-10-CM | POA: Diagnosis not present

## 2019-05-26 DIAGNOSIS — M25561 Pain in right knee: Secondary | ICD-10-CM | POA: Diagnosis present

## 2019-05-26 DIAGNOSIS — E119 Type 2 diabetes mellitus without complications: Secondary | ICD-10-CM | POA: Insufficient documentation

## 2019-05-26 LAB — CBG MONITORING, ED: Glucose-Capillary: 90 mg/dL (ref 70–99)

## 2019-05-26 MED ORDER — METOPROLOL TARTRATE 25 MG PO TABS
25.0000 mg | ORAL_TABLET | Freq: Two times a day (BID) | ORAL | Status: DC
  Administered 2019-05-26: 22:00:00 25 mg via ORAL
  Filled 2019-05-26: qty 1

## 2019-05-26 MED ORDER — METOPROLOL TARTRATE 25 MG PO TABS: 25.0000 mg | ORAL_TABLET | Freq: Two times a day (BID) | ORAL | Status: DC

## 2019-05-26 MED ORDER — ACETAMINOPHEN 325 MG PO TABS
650.0000 mg | ORAL_TABLET | Freq: Once | ORAL | Status: AC
  Administered 2019-05-26: 650 mg via ORAL
  Filled 2019-05-26: qty 2

## 2019-05-26 MED ORDER — HYDRALAZINE HCL 25 MG PO TABS
50.0000 mg | ORAL_TABLET | Freq: Four times a day (QID) | ORAL | Status: DC
  Administered 2019-05-26: 50 mg via ORAL
  Filled 2019-05-26: qty 2

## 2019-05-26 MED ORDER — CLONIDINE HCL 0.2 MG PO TABS
0.3000 mg | ORAL_TABLET | Freq: Three times a day (TID) | ORAL | Status: DC
  Administered 2019-05-26: 0.3 mg via ORAL
  Filled 2019-05-26: qty 1

## 2019-05-26 MED ORDER — HYDRALAZINE HCL 25 MG PO TABS: 50.0000 mg | ORAL_TABLET | Freq: Four times a day (QID) | ORAL | Status: DC

## 2019-05-26 MED ORDER — CLONIDINE HCL 0.2 MG PO TABS: 0.3000 mg | ORAL_TABLET | Freq: Three times a day (TID) | ORAL | Status: DC

## 2019-05-26 NOTE — Discharge Instructions (Addendum)
Follow-up with your primary care doctor or consider seeing an orthopedic doctor, take over the counter medications as needed for pain

## 2019-05-26 NOTE — ED Triage Notes (Signed)
Pt from home with ems c.o right knee pain (achy). Left side weakness form prior stroke. Uses cane but has not been ambulating as much due to the pain. Took ibuprofen without relief of pain.

## 2019-05-26 NOTE — ED Notes (Signed)
Pt states she has not taken her BP medications today.

## 2019-05-26 NOTE — ED Notes (Signed)
[facility] called  

## 2019-05-26 NOTE — ED Provider Notes (Signed)
[facility] DEPARTMENT Provider Note   CSN: [id number] Arrival date & time: 05/26/19  1753     History   Chief Complaint Chief Complaint  Patient presents with  . Knee Pain    HPI Diamond Rice is a 48 y.o. female.     HPI Patient presents to the emergency room for evaluation of right knee pain.  Patient has had a stroke in the past and favors her right side.  She felt that her knee was giving out on her today when she was trying to walk.  She denies any recent falls.  No fevers or chills. Past Medical History:  Diagnosis Date  . Anxiety   . Chronic lower back pain 04/17/2012   "just got over some; catched when I walked"  . COPD (chronic obstructive pulmonary disease) ([place])   . Critical lower limb ischemia 05/27/2018  . Depression   . Headache(784.0) 04/17/2012   "~ qod; lately waking up in am w/one"  . High cholesterol   . Hypertension   . Migraines 04/17/2012  . Obesity   . Sleep apnea    CPAP  . Stroke ([place])   . Type II diabetes mellitus ([place]) 08/28/2002    Patient Active Problem List   Diagnosis Date Noted  . Snoring 02/19/2019  . Hemorrhagic stroke ([place])   . Diastolic dysfunction   . Diabetes mellitus type 2 in obese ([place])   . Anemia of chronic disease   . Chronic obstructive pulmonary disease ([place])   . ICH (intracerebral hemorrhage) ([place]) 08/30/2018  . Hypertensive heart disease without heart failure 07/04/2018  . Nonrheumatic aortic valve insufficiency 07/04/2018  . Esophageal abnormality   . Class 2 severe obesity due to excess calories with serious comorbidity and body mass index (BMI) of 35.0 to 35.9 in adult [facility])   . Peripheral vascular disease ([place])   . History of osteomyelitis   . History of amputation of lesser toe ([place])   . Pulmonary embolism ([place]) 06/22/2018  . Microcytic anemia 06/17/2018  . At risk for adverse drug reaction 06/11/2018  . Critical lower limb ischemia 05/27/2018  . GI bleeding 04/02/2018  .  Acute renal failure (ARF) ([place]) 04/02/2018  . Hypokalemia 04/02/2018  . Polysubstance abuse ([place]) 04/02/2018  . Diabetic foot ulcer ([place]) 04/02/2018  . Cannabis use disorder, moderate, dependence ([place]) 05/19/2017  . Cocaine use disorder, moderate, dependence ([place]) 05/19/2017  . Schizoaffective disorder, bipolar type ([place]) 10/07/2015  . OSA (obstructive sleep apnea) 06/09/2015  . Vitamin D deficiency 01/06/2015  . Bilateral knee pain 01/05/2015  . Numbness and tingling in right hand 01/05/2015  . Insomnia 12/07/2014  . Right-sided lacunar infarction ([place]) 09/15/2014  . Left hemiparesis ([place]) 09/15/2014  . Dyspnea   . Back pain 09/11/2013  . Psychoactive substance-induced organic mood disorder ([place]) 05/28/2013  . Cocaine abuse with cocaine-induced mood disorder ([place]) 05/28/2013  . Cannabis dependence with cannabis-induced anxiety disorder ([place]) 05/28/2013  . Morbid obesity with BMI of 45.0-49.9, adult ([place]) 04/25/2012  . Chest pain 04/17/2012  . Hypertension 04/17/2012  . DM (diabetes mellitus) with peripheral vascular complication ([place]) [id number]  . Hyperlipidemia 04/17/2012  . Tobacco use 04/17/2012    Past Surgical History:  Procedure Laterality Date  . ABDOMINAL AORTOGRAM W/LOWER EXTREMITY Left 05/27/2018   Procedure: ABDOMINAL AORTOGRAM W/LOWER EXTREMITY Runoff and Possible Intervention;  Surgeon: Waynetta Sandy, MD;  Location: [facility];  Service: Cardiovascular;  Laterality: Left;  . APPLICATION OF WOUND VAC Left 06/06/2018  Procedure: APPLICATION OF WOUND VAC;  Surgeon: Waynetta Sandy, MD;  Location: [facility];  Service: Vascular;  Laterality: Left;  . BIOPSY  04/04/2018   Procedure: BIOPSY;  Surgeon: [name], MD;  Location: [facility] ENDOSCOPY;  Service: Endoscopy;;  . CARDIAC CATHETERIZATION  ~ 2011  . COLONOSCOPY N/A 04/04/2018   Procedure: COLONOSCOPY;  Surgeon: [name], MD;  Location: [facility] ENDOSCOPY;  Service: Endoscopy;  Laterality: N/A;  .  LOWER EXTREMITY ANGIOGRAM Left 05/27/2018  . PERIPHERAL VASCULAR INTERVENTION Left 05/27/2018   Procedure: PERIPHERAL VASCULAR INTERVENTION;  Surgeon: Waynetta Sandy, MD;  Location: [facility];  Service: Cardiovascular;  Laterality: Left;  . POLYPECTOMY  04/04/2018   Procedure: POLYPECTOMY;  Surgeon: [name], MD;  Location: [facility] ENDOSCOPY;  Service: Endoscopy;;  . TRANSMETATARSAL AMPUTATION Left 05/28/2018   Procedure: AMPUTATION TOES THREE, FOUR AND FIVE on left foot;  Surgeon: Waynetta Sandy, MD;  Location: [facility];  Service: Vascular;  Laterality: Left;  . WOUND DEBRIDEMENT Left 06/06/2018   Procedure: DEBRIDEMENT WOUND LEFT FOOT;  Surgeon: Waynetta Sandy, MD;  Location: [facility];  Service: Vascular;  Laterality: Left;     OB History   No obstetric history on file.      Home Medications    Prior to Admission medications   Medication Sig Start Date End Date Taking? Authorizing Provider  ARIPiprazole (ABILIFY) 15 MG tablet Take 1 tablet (15 mg total) by mouth at bedtime. 08/26/18   Medina-Vargas, Monina C, NP  aspirin EC 81 MG EC tablet Take 1 tablet (81 mg total) by mouth daily. 09/21/18   Donzetta Starch, NP  cloNIDine (CATAPRES) 0.3 MG tablet Take 1 tablet (0.3 mg total) by mouth 3 (three) times daily. 08/26/18   Medina-Vargas, Monina C, NP  feeding supplement, GLUCERNA SHAKE, (GLUCERNA SHAKE) LIQD Take 237 mLs by mouth 3 (three) times daily between meals. 09/20/18   Donzetta Starch, NP  ferrous sulfate 325 (65 FE) MG tablet Take 1 tablet (325 mg total) by mouth daily with breakfast. 08/26/18   Medina-Vargas, Monina C, NP  furosemide (LASIX) 40 MG tablet Take 1 tablet (40 mg total) by mouth daily. 08/26/18   Medina-Vargas, Monina C, NP  gabapentin (NEURONTIN) 100 MG capsule Take 1 capsule (100 mg total) by mouth 2 (two) times daily. 09/20/18   Donzetta Starch, NP  hydrALAZINE (APRESOLINE) 50 MG tablet Take 1 tablet (50 mg total) by mouth 4 (four) times  daily. 08/26/18   Medina-Vargas, Monina C, NP  Insulin Detemir (LEVEMIR) 100 UNIT/ML Pen Inject 15 Units into the skin 2 (two) times daily. Patient taking differently: Inject 15 Units into the skin See admin instructions. Inject 15 units into the skin two times a day- morning and bedtime 08/26/18   Medina-Vargas, Monina C, NP  lip balm (CARMEX) ointment Apply topically as needed for lip care. 09/20/18   Donzetta Starch, NP  lisinopril (PRINIVIL,ZESTRIL) 30 MG tablet Take 1 tablet (30 mg total) by mouth daily. 08/26/18   Medina-Vargas, Monina C, NP  Melatonin 3 MG TABS Take 3 mg by mouth at bedtime.     [provider]  Menthol-Methyl Salicylate (MUSCLE RUB) 10-15 % CREA Apply 1 application topically every 6 (six) hours as needed for muscle pain. 09/20/18   Donzetta Starch, NP  metoprolol tartrate (LOPRESSOR) 25 MG tablet Take 1 tablet (25 mg total) by mouth 2 (two) times daily. 08/26/18   Medina-Vargas, Monina C, NP  ondansetron (ZOFRAN) 4 MG tablet Take  1 tablet (4 mg total) by mouth every 6 (six) hours. 02/11/19   Kendrick, Caitlyn S, PA-C  pentoxifylline (TRENTAL) 400 MG CR tablet Take 1 tablet (400 mg total) by mouth 3 (three) times daily with meals. 08/26/18   Medina-Vargas, Monina C, NP  polyethylene glycol (MIRALAX / GLYCOLAX) packet Take 17 g by mouth daily as needed for mild constipation. 09/20/18   Donzetta Starch, NP  potassium chloride SA (K-DUR,KLOR-CON) 20 MEQ tablet Take 1 tablet (20 mEq total) by mouth daily. 08/26/18   Medina-Vargas, Monina C, NP  senna-docusate (SENOKOT-S) 8.6-50 MG tablet Take 1 tablet by mouth at bedtime. 09/20/18   Donzetta Starch, NP  silver sulfADIAZINE (SILVADENE) 1 % cream Apply topically daily. 09/21/18   Donzetta Starch, NP    Family History Family History  Problem Relation Age of Onset  . Stroke Brother 59  . Stroke Brother 80  . Heart attack Mother 48  . Stroke Father 72    Social History Social History   Tobacco Use  . Smoking status: Former  Smoker    Packs/day: 0.25    Years: 28.00    Pack years: 7.00    Types: Cigarettes  . Smokeless tobacco: Never Used  Substance Use Topics  . Alcohol use: No    Alcohol/week: 0.0 standard drinks    Comment: rare  . Drug use: Yes    Types: Marijuana, "Crack" cocaine    Comment: marijuana last use days ago; last cocaine several months ago     Allergies   Morphine and related and Metformin   Review of Systems Review of Systems  All other systems reviewed and are negative.    Physical Exam Updated Vital Signs BP (!) 168/117 (BP Location: Right Arm)   Pulse 80   Temp 98.7 F (37.1 C) (Oral)   Resp 14   SpO2 100%   Physical Exam Vitals signs and nursing note reviewed.  Constitutional:      General: She is not in acute distress.    Appearance: She is well-developed.  HENT:     Head: Normocephalic and atraumatic.     Right Ear: External ear normal.     Left Ear: External ear normal.  Eyes:     General: No scleral icterus.       Right eye: No discharge.        Left eye: No discharge.     Conjunctiva/sclera: Conjunctivae normal.  Neck:     Musculoskeletal: Neck supple.     Trachea: No tracheal deviation.  Cardiovascular:     Rate and Rhythm: Normal rate.  Pulmonary:     Effort: Pulmonary effort is normal. No respiratory distress.     Breath sounds: No stridor.  Abdominal:     General: There is no distension.  Musculoskeletal:        General: No swelling or deformity.     Right knee: She exhibits no swelling, no erythema, normal alignment, no LCL laxity, normal patellar mobility and no MCL laxity. Tenderness found.  Skin:    General: Skin is warm and dry.     Findings: No rash.  Neurological:     Mental Status: She is alert.     Cranial Nerves: Cranial nerve deficit: no gross deficits.      ED Treatments / Results  Labs (all labs ordered are listed, but only abnormal results are displayed) Labs Reviewed  CBG MONITORING, ED    EKG None  Radiology  Dg Knee Complete 4  Views Right  Result Date: 05/26/2019 CLINICAL DATA:  Right knee pain, NKI. Hx of stroke, diabetes. EXAM: RIGHT KNEE - COMPLETE 4+ VIEW COMPARISON:  Right knee radiographs 11/18/2012 FINDINGS: No evidence of fracture, dislocation, or joint effusion. Moderate medial and patellofemoral compartment joint space narrowing with spurring. The lateral joint space is maintained. No focal bony lesion. Soft tissues are unremarkable. IMPRESSION: 1. No acute findings in the right knee. 2. Degenerative changes most pronounced in the medial and patellofemoral compartments. Electronically Signed   By: Audie Pinto M.D.   On: 05/26/2019 18:56    Procedures Procedures (including critical care time)  Medications Ordered in ED Medications  cloNIDine (CATAPRES) tablet 0.3 mg (0.3 mg Oral Given 05/26/19 2202)  hydrALAZINE (APRESOLINE) tablet 50 mg (50 mg Oral Given 05/26/19 2202)  metoprolol tartrate (LOPRESSOR) tablet 25 mg (25 mg Oral Given 05/26/19 2202)  acetaminophen (TYLENOL) tablet 650 mg (has no administration in time range)     Initial Impression / Assessment and Plan / ED Course  I have reviewed the triage vital signs and the nursing notes.  Pertinent labs & imaging results that were available during my care of the patient were reviewed by me and considered in my medical decision making (see chart for details).  Clinical Course as of May 26 2311  Mon May 26, 2019  2311 Blood pressure is improved after administration of home medications.   [JK]    Clinical Course User Index [JK] Dorie Rank, MD     Patient's x-rays show no signs of fracture or acute injury.  She does have evidence of arthritic changes.  This most likely is related to her abnormal gait pattern.  Patient also was noted to be rather hypertensive.  She has not taken in her blood pressure medications today.  Plan on ordering a knee immobilizer and I have ordered her home blood pressure medications.  Final Clinical  Impressions(s) / ED Diagnoses   Final diagnoses:  Arthritis of right knee  Hypertension, unspecified type    ED Discharge Orders    None       Dorie Rank, MD 05/26/19 2312

## 2019-05-26 NOTE — Progress Notes (Signed)
Orthopedic Tech Progress Note Patient Details:  Diamond Rice [birth year] [id number]  Ortho Devices Type of Ortho Device: Knee Immobilizer Ortho Device/Splint Location: rle Ortho Device/Splint Interventions: Ordered, Application, Adjustment   Post Interventions Patient Tolerated: Well Instructions Provided: Care of device, Adjustment of device   Karolee Stamps 05/26/2019, 9:32 PM

## 2019-05-27 ENCOUNTER — Ambulatory Visit: Payer: Medicare Other | Admitting: Physical Therapy

## 2019-06-02 ENCOUNTER — Ambulatory Visit: Payer: Medicare Other | Attending: Internal Medicine | Admitting: Physical Therapy

## 2019-06-02 ENCOUNTER — Other Ambulatory Visit: Payer: Self-pay

## 2019-06-02 VITALS — BP 121/92 | HR 70

## 2019-06-02 DIAGNOSIS — R262 Difficulty in walking, not elsewhere classified: Secondary | ICD-10-CM | POA: Diagnosis present

## 2019-06-02 DIAGNOSIS — M6281 Muscle weakness (generalized): Secondary | ICD-10-CM | POA: Insufficient documentation

## 2019-06-02 DIAGNOSIS — R29818 Other symptoms and signs involving the nervous system: Secondary | ICD-10-CM

## 2019-06-02 DIAGNOSIS — R269 Unspecified abnormalities of gait and mobility: Secondary | ICD-10-CM | POA: Insufficient documentation

## 2019-06-02 NOTE — Therapy (Signed)
[facility] [address] [place] [employer], [place], [address] Phone: [phone]   Fax:  [phone]  Physical Therapy Evaluation  Patient Details  Name: Diamond Rice MRN: [id number] Date of Birth: 12/14/1970 Referring Provider (PT): Johney Maine, MD   Encounter Date: 06/02/2019  PT End of Session - 06/02/19 2045    Visit Number  1    Number of Visits  13    Date for PT Re-Evaluation  08/31/19   written for 60 day POC   Authorization Type  Medicare & Medicaid    PT Start Time  1450    PT Stop Time  1532    PT Time Calculation (min)  42 min    Equipment Utilized During Treatment  Gait belt;Right knee immobilizer    Activity Tolerance  Patient limited by pain    Behavior During Therapy  [facility] for tasks assessed/performed       Past Medical History:  Diagnosis Date  . Anxiety   . Chronic lower back pain 04/17/2012   "just got over some; catched when I walked"  . COPD (chronic obstructive pulmonary disease) ([place])   . Critical lower limb ischemia 05/27/2018  . Depression   . Headache(784.0) 04/17/2012   "~ qod; lately waking up in am w/one"  . High cholesterol   . Hypertension   . Migraines 04/17/2012  . Obesity   . Sleep apnea    CPAP  . Stroke ([place])   . Type II diabetes mellitus ([place]) 08/28/2002    Past Surgical History:  Procedure Laterality Date  . ABDOMINAL AORTOGRAM W/LOWER EXTREMITY Left 05/27/2018   Procedure: ABDOMINAL AORTOGRAM W/LOWER EXTREMITY [place] and Possible Intervention;  Surgeon: Waynetta Sandy, MD;  Location: [facility];  Service: Cardiovascular;  Laterality: Left;  . APPLICATION OF WOUND VAC Left 06/06/2018   Procedure: APPLICATION OF WOUND VAC;  Surgeon: Waynetta Sandy, MD;  Location: [facility];  Service: Vascular;  Laterality: Left;  . BIOPSY  04/04/2018   Procedure: BIOPSY;  Surgeon: [name], MD;  Location: [facility] ENDOSCOPY;  Service: Endoscopy;;  . CARDIAC  CATHETERIZATION  ~ 2011  . COLONOSCOPY N/A 04/04/2018   Procedure: COLONOSCOPY;  Surgeon: [name], MD;  Location: [facility] ENDOSCOPY;  Service: Endoscopy;  Laterality: N/A;  . LOWER EXTREMITY ANGIOGRAM Left 05/27/2018  . PERIPHERAL VASCULAR INTERVENTION Left 05/27/2018   Procedure: PERIPHERAL VASCULAR INTERVENTION;  Surgeon: Waynetta Sandy, MD;  Location: [facility];  Service: Cardiovascular;  Laterality: Left;  . POLYPECTOMY  04/04/2018   Procedure: POLYPECTOMY;  Surgeon: [name], MD;  Location: [facility] ENDOSCOPY;  Service: Endoscopy;;  . TRANSMETATARSAL AMPUTATION Left 05/28/2018   Procedure: AMPUTATION TOES THREE, FOUR AND FIVE on left foot;  Surgeon: Waynetta Sandy, MD;  Location: [facility];  Service: Vascular;  Laterality: Left;  . WOUND DEBRIDEMENT Left 06/06/2018   Procedure: DEBRIDEMENT WOUND LEFT FOOT;  Surgeon: Waynetta Sandy, MD;  Location: [facility];  Service: Vascular;  Laterality: Left;    Vitals:   06/02/19 1458  BP: (!) 121/92  Pulse: 70     Subjective Assessment - 06/02/19 1455    Subjective  Pt reports right knee pain that started  with knee immobilizer given a week ago in ER. Brace positioned down on leg upon arrival. Had  PT and OT home health therapy. History of most recent CVA in 1/ 2020.    Pertinent History  PMH: rt CVA (2016, residual left sided weakness), lt transmet amputation  05/28/18, Sleep apnea, COPD, smoker, DM, Bipolar, HTN, anxiety, depression.    Patient Stated Goals  To strengthen left side    Currently in Pain?  Yes    Pain Score  10-Worst pain ever   pain with any movements   Pain Location  Knee    Pain Orientation  Right    Pain Descriptors / Indicators  Aching    Pain Type  Acute pain    Pain Onset  1 to 4 weeks ago    Pain Frequency  Intermittent    Aggravating Factors   movement    Pain Relieving Factors  laying down stretched out         [facility] PT Assessment - 06/02/19 1502      Assessment   Medical  Diagnosis  late effects of CVA (2016) L sided weakness    Referring Provider (PT)  Johney Maine, MD    Onset Date/Surgical Date  06/02/15    Hand Dominance  Right    Prior Therapy  received HH OT and PT at beginning of this year      Precautions   Required Braces or Orthoses  Knee Immobilizer - Right    Knee Immobilizer - Right  Other (comment)   no precautions, given from ER due to pain approx. 05/28/19     Restrictions   Weight Bearing Restrictions  No   per pt report     Balance Screen   Has the patient fallen in the past 6 months  No    Has the patient had a decrease in activity level because of a fear of falling?   No    Is the patient reluctant to leave their home because of a fear of falling?   Yes      [place] residence    Living Arrangements  Other (Comment)   Godmother   Available Help at Discharge  Other (Comment)   "a little bit" - puts shower chair in shower   Type of [place] to enter    Entrance Stairs-Number of Steps  3    Entrance Stairs-Rails  Can reach both    [place]  One level    Home Armed forces logistics/support/administrative officer - 2 wheels;Other (comment);Cane - quad   rollator    Additional Comments  stopped using rollator since seeing HHPT and has been using quad cane since (approx. 6 months ago). doesn't leave the house unless it is for doctor's appointments - godmother does all the grocery shopping, cooking.       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      Cognition   Overall Cognitive Status  Within Functional Limits for tasks assessed      Sensation   Light Touch  Appears Intact    Hot/Cold  Appears Intact   per pt report     Coordination   Gross Motor Movements are Fluid and Coordinated  No    Heel Shin Test  unable to perform on LLE secondary to  weakness, unable to assess on RLE secondary to knee immobilizer       ROM / Strength   AROM / PROM / Strength  Strength      Strength   Overall Strength  Deficits    Overall Strength Comments  Unable to assess R knee strength secondary to knee immobilizer    Strength Assessment Site  Hip;Knee;Ankle    Right/Left Hip  Right;Left    Left Hip Flexion  2/5    Right/Left Knee  Right;Left    Left Knee Flexion  3+/5    Left Knee Extension  3+/5    Right/Left Ankle  Right;Left    Right Ankle Dorsiflexion  5/5    Left Ankle Dorsiflexion  4/5      Transfers   Five time sit to stand comments   41.19 seconds with RUE pushing off from cane, from high mat table.    Comments  Stand step transfer from bariatric transport w/c (from facility) to mat table - supervision       Ambulation/Gait   Ambulation/Gait  Yes    Ambulation/Gait Assistance  4: Min guard    Ambulation Distance (Feet)  30 Feet    Assistive device  Large base quad cane    Gait Pattern  Step-to pattern;Decreased arm swing - left;Decreased stance time - right;Decreased step length - left;Decreased weight shift to right;Decreased stride length;Antalgic;Trunk flexed;Poor foot clearance - right;Poor foot clearance - left;Right foot flat;Left foot flat   with R knee immobilizier    Ambulation Surface  Level;Indoor    Gait Comments  With w/c follow for safety due to pt reporting increased R knee pain          PT Short Term Goals - 06/06/19 0724      PT SHORT TERM GOAL #1   Title  Patient will be independent with initial HEP in order to build upon functional gains in therapy. ALL STGS DUE 07/06/19    Time  4    Period  Weeks    Status  New    Target Date  07/06/19      PT SHORT TERM GOAL #2   Title  Patient will undergo assessment of BERG and TUG to address fall risk - LTG written as appropriate.    Baseline  not yet assessed    Time  4    Period  Weeks    Status  New      PT SHORT TERM GOAL #3   Title  Patient will report  worst R knee pain to a 4/10 or less in order to improve tolerance for ambulation at home.    Baseline  10/10 on 06/02/19    Time  4    Period  Weeks    Status  New      PT SHORT TERM GOAL #4   Title  Patient will ambulate at least 49' with quad base cane and supervision in order to improve functional ambulation at home.    Baseline  limited to 30' at eval with R knee immobilizer due to increased R knee pain    Time  4    Period  Weeks    Status  New            PT Long Term Goals - 06/06/19 [id number]      PT LONG TERM GOAL #1   Title  Patient will be independent with final HEP in order to build upon functional gains in therapy. ALL LTGS DUE 08/05/19    Time  8    Period  Weeks    Status  New    Target Date  08/05/19      PT LONG TERM GOAL #2  Title  Patient will improve baseline BERG score by at least 6 points in order to demo decreased fall risk.    Time  8    Period  Weeks    Status  New      PT LONG TERM GOAL #3   Title  TUG goal to be written after assessed in order to determine fall risk.    Time  8    Period  Weeks    Status  New      PT LONG TERM GOAL #4   Title  Patient will ambulate at least 150' indoors and outdoors with quad base cane and supervision  in order to improve tolerance for walking in and out of doctors appointments    Time  8    Period  Weeks    Status  New      PT LONG TERM GOAL #5   Title  Patient will decrease 5x sit <> stand time to at least 20 seconds with single UE assist from low mat table in order to demo improved LE strength.    Baseline  41.19 seconds from high mat table, RUE pushing from quad cane.    Time  8    Period  Weeks    Status  New           Objective measurements completed on examination: See above findings.              PT Education - 06/02/19 2045    Education Details  clinical findings, POC    Person(s) Educated  Patient    Methods  Explanation    Comprehension  Verbalized understanding                 06/06/19 0714  Plan  Clinical Impression Statement Patient is a 48 year old female referred to Neuro OPPT for evaluation with primary concern of CVA and deconditioning - most recent CVA in 08/2018, history of R CVA in 2016 with residual L sided weakness. Pt's PMH is significant for: lt transmet amputation 05/28/18, Sleep apnea, COPD, smoker, DM, Bipolar, HTN, anxiety, depression.  The following deficits were present during the exam: antalgic gait, decreased LE strength, decreased ROM, decreased coordination, R knee pain.  Limited evaluation today for outcome measures due to pt having acute R knee pain and wearing a R knee immobilizer (given to her a couple days ago at the ER). Will further assess balance measures when appropriate. Pt would benefit from skilled PT to address these impairments and functional limitations to maximize functional mobility independence.  Personal Factors and Comorbidities Comorbidity 3+;Past/Current Experience;Time since onset of injury/illness/exacerbation  Comorbidities rt CVA (2016, residual left sided weakness), lt transmet amputation 05/28/18, Sleep apnea, COPD, smoker, DM, Bipolar, HTN, anxiety, depression  Examination-Activity Limitations Stand;Locomotion Level;Transfers  Examination-Participation Restrictions Community Activity;Meal Prep;Cleaning  Pt will benefit from skilled therapeutic intervention in order to improve on the following deficits Abnormal gait;Decreased balance;Decreased activity tolerance;Decreased endurance;Decreased mobility;Decreased range of motion;Decreased strength;Difficulty walking;Decreased coordination;Pain  Stability/Clinical Decision Making Evolving/Moderate complexity  Clinical Decision Making Moderate  Rehab Potential Fair  PT Frequency 2x / week  PT Duration 4 weeks (followed by 1x week for 4 weeks)  PT Treatment/Interventions ADLs/Self Care Home Management;Moist Heat;Gait training;DME Instruction;Stair  training;Therapeutic activities;Functional mobility training;Therapeutic exercise;Balance training;Neuromuscular re-education;Patient/family education;Passive range of motion  PT Next Visit Plan assess BERG/TUG and gait speed when appropriate. initial HEP.  Consulted and Agree with Plan of Care Patient  Patient will benefit from skilled therapeutic intervention in order to improve the following deficits and impairments:     Visit Diagnosis: Other symptoms and signs involving the nervous system  Difficulty in walking, not elsewhere classified  Gait abnormality  Muscle weakness (generalized)     Problem List Patient Active Problem List   Diagnosis Date Noted  . Snoring 02/19/2019  . Hemorrhagic stroke ([place])   . Diastolic dysfunction   . Diabetes mellitus type 2 in obese ([place])   . Anemia of chronic disease   . Chronic obstructive pulmonary disease ([place])   . ICH (intracerebral hemorrhage) ([place]) 08/30/2018  . Hypertensive heart disease without heart failure 07/04/2018  . Nonrheumatic aortic valve insufficiency 07/04/2018  . Esophageal abnormality   . Class 2 severe obesity due to excess calories with serious comorbidity and body mass index (BMI) of 35.0 to 35.9 in adult [facility])   . Peripheral vascular disease ([place])   . History of osteomyelitis   . History of amputation of lesser toe ([place])   . Pulmonary embolism ([place]) 06/22/2018  . Microcytic anemia 06/17/2018  . At risk for adverse drug reaction 06/11/2018  . Critical lower limb ischemia 05/27/2018  . GI bleeding 04/02/2018  . Acute renal failure (ARF) ([place]) 04/02/2018  . Hypokalemia 04/02/2018  . Polysubstance abuse ([place]) 04/02/2018  . Diabetic foot ulcer ([place]) 04/02/2018  . Cannabis use disorder, moderate, dependence ([place]) 05/19/2017  . Cocaine use disorder, moderate, dependence ([place]) 05/19/2017  . Schizoaffective disorder, bipolar type ([place]) 10/07/2015  . OSA (obstructive sleep apnea) 06/09/2015  . Vitamin D  deficiency 01/06/2015  . Bilateral knee pain 01/05/2015  . Numbness and tingling in right hand 01/05/2015  . Insomnia 12/07/2014  . Right-sided lacunar infarction ([place]) 09/15/2014  . Left hemiparesis ([place]) 09/15/2014  . Dyspnea   . Back pain 09/11/2013  . Psychoactive substance-induced organic mood disorder ([place]) 05/28/2013  . Cocaine abuse with cocaine-induced mood disorder ([place]) 05/28/2013  . Cannabis dependence with cannabis-induced anxiety disorder ([place]) 05/28/2013  . Morbid obesity with BMI of 45.0-49.9, adult ([place]) 04/25/2012  . Chest pain 04/17/2012  . Hypertension 04/17/2012  . DM (diabetes mellitus) with peripheral vascular complication ([place]) [id number]  . Hyperlipidemia 04/17/2012  . Tobacco use 04/17/2012    Arliss Journey, PT, DPT 06/02/2019, 8:46 PM  [facility] [address] [place], [place], [address] Phone: [phone]   Fax:  [phone]  Name: Diamond Rice MRN: [id number] Date of Birth: 10-10-1970

## 2019-06-10 ENCOUNTER — Other Ambulatory Visit: Payer: Self-pay

## 2019-06-10 ENCOUNTER — Ambulatory Visit: Payer: Medicare Other

## 2019-06-10 DIAGNOSIS — R29818 Other symptoms and signs involving the nervous system: Secondary | ICD-10-CM | POA: Diagnosis not present

## 2019-06-10 DIAGNOSIS — M6281 Muscle weakness (generalized): Secondary | ICD-10-CM

## 2019-06-10 DIAGNOSIS — R269 Unspecified abnormalities of gait and mobility: Secondary | ICD-10-CM

## 2019-06-10 DIAGNOSIS — R262 Difficulty in walking, not elsewhere classified: Secondary | ICD-10-CM

## 2019-06-10 NOTE — Therapy (Signed)
[facility] [address] [place], [place], [address] Phone: [phone]   Fax:  [phone]  Physical Therapy Treatment  Patient Details  Name: Diamond Rice MRN: [id number] Date of Birth: 05/23/1971 Referring Provider (PT): Johney Maine, MD   Encounter Date: 06/10/2019  PT End of Session - 06/10/19 1428    Visit Number  2    Number of Visits  13    Date for PT Re-Evaluation  08/31/19   written for 60 day POC   Authorization Type  Medicare & Medicaid    PT Start Time  1315    PT Stop Time  1400    PT Time Calculation (min)  45 min    Equipment Utilized During Treatment  Gait belt;Other (comment)   pt had right knee brace   Activity Tolerance  Patient limited by pain    Behavior During Therapy  [facility] for tasks assessed/performed       Past Medical History:  Diagnosis Date  . Anxiety   . Chronic lower back pain 04/17/2012   "just got over some; catched when I walked"  . COPD (chronic obstructive pulmonary disease) ([place])   . Critical lower limb ischemia 05/27/2018  . Depression   . Headache(784.0) 04/17/2012   "~ qod; lately waking up in am w/one"  . High cholesterol   . Hypertension   . Migraines 04/17/2012  . Obesity   . Sleep apnea    CPAP  . Stroke ([place])   . Type II diabetes mellitus ([place]) 08/28/2002    Past Surgical History:  Procedure Laterality Date  . ABDOMINAL AORTOGRAM W/LOWER EXTREMITY Left 05/27/2018   Procedure: ABDOMINAL AORTOGRAM W/LOWER EXTREMITY Runoff and Possible Intervention;  Surgeon: Waynetta Sandy, MD;  Location: [place] [facility];  Service: Cardiovascular;  Laterality: Left;  . APPLICATION OF WOUND VAC Left 06/06/2018   Procedure: APPLICATION OF WOUND VAC;  Surgeon: Waynetta Sandy, MD;  Location: [place];  Service: Vascular;  Laterality: Left;  . BIOPSY  04/04/2018   Procedure: BIOPSY;  Surgeon: [name], MD;  Location: [facility] ENDOSCOPY;  Service: Endoscopy;;   . CARDIAC CATHETERIZATION  ~ 2011  . COLONOSCOPY N/A 04/04/2018   Procedure: COLONOSCOPY;  Surgeon: [name], MD;  Location[facility] ENDOSCOPY;  Service: Endoscopy;  Laterality: N/A;  . LOWER EXTREMITY ANGIOGRAM Left 05/27/2018  . PERIPHERAL VASCULAR INTERVENTION Left 05/27/2018   Procedure: PERIPHERAL VASCULAR INTERVENTION;  Surgeon: Waynetta Sandy, MD;  Location: [place] [facility];  Service: Cardiovascular;  Laterality: Left;  . POLYPECTOMY  04/04/2018   Procedure: POLYPECTOMY;  Surgeon: [name], MD;  Location: [facility] ENDOSCOPY;  Service: Endoscopy;;  . TRANSMETATARSAL AMPUTATION Left 05/28/2018   Procedure: AMPUTATION TOES THREE, FOUR AND FIVE on left foot;  Surgeon: Waynetta Sandy, MD;  Location: [place];  Service: Vascular;  Laterality: Left;  . WOUND DEBRIDEMENT Left 06/06/2018   Procedure: DEBRIDEMENT WOUND LEFT FOOT;  Surgeon: Waynetta Sandy, MD;  Location: [place];  Service: Vascular;  Laterality: Left;    There were no vitals filed for this visit.  Subjective Assessment - 06/10/19 1320    Subjective  Pt reports that she got a cortizone shot on Friday from ortho. Helped for 1 day and then pain started back. Pt reports MD showed cartilage gone in right knee from x-ray. Was given new knee brace to wear when she is up walking around.    Pertinent History  PMH: rt CVA (2016, residual left sided weakness),  lt transmet amputation 05/28/18, Sleep apnea, COPD, smoker, DM, Bipolar, HTN, anxiety, depression.    Patient Stated Goals  To strengthen left side    Currently in Pain?  Yes    Pain Score  10-Worst pain ever   no pain at rest, just in weight bearing   Pain Location  Knee    Pain Orientation  Right    Pain Descriptors / Indicators  Aching    Pain Type  Acute pain    Pain Onset  1 to 4 weeks ago    Pain Frequency  Intermittent                       [facility] Adult PT Treatment/Exercise - 06/10/19 1322      Bed Mobility   Bed Mobility   Supine to Sit;Sit to Supine    Supine to Sit  Independent   required increased time to rise   Sit to Supine  Independent      Transfers   Transfers  Sit to Stand;Stand to Sit    Sit to Stand  5: Supervision    Sit to Stand Details  Verbal cues for technique    Stand to Sit  5: Supervision    Stand to Sit Details (indicate cue type and reason)  Verbal cues for sequencing;Verbal cues for technique    Stand to Sit Details  Instructed to back all the way up and turn feet before sitting to prevent twisting knee      Ambulation/Gait   Ambulation/Gait  Yes    Ambulation/Gait Assistance  4: Min guard    Ambulation Distance (Feet)  50 Feet   50' x 2   Assistive device  Large base quad cane    Gait Pattern  Step-to pattern;Decreased arm swing - left;Decreased step length - right;Decreased step length - left;Decreased stance time - right;Decreased weight shift to right;Left genu recurvatum    Ambulation Surface  Level;Indoor      Standardized Balance Assessment   Standardized Balance Assessment  Berg Balance Test;Timed Up and Go Test      Berg Balance Test   Sit to Stand  Able to stand  independently using hands    Standing Unsupported  Able to stand safely 2 minutes    Sitting with Back Unsupported but Feet Supported on Floor or Stool  Able to sit safely and securely 2 minutes    Stand to Sit  Sits safely with minimal use of hands    Transfers  Able to transfer safely, definite need of hands    Standing Unsupported with Eyes Closed  Able to stand 10 seconds with supervision    Standing Ubsupported with Feet Together  Able to place feet together independently but unable to hold for 30 seconds    From Standing, Reach Forward with Outstretched Arm  Can reach forward >12 cm safely (5")    From Standing Position, Pick up Object from Floor  Unable to pick up and needs supervision    From Standing Position, Turn to Look Behind Over each Shoulder  Turn sideways only but maintains balance    Turn  360 Degrees  Needs close supervision or verbal cueing    Standing Unsupported, Alternately Place Feet on Step/Stool  Able to complete >2 steps/needs minimal assist    Standing Unsupported, One Foot in Front  Needs help to step but can hold 15 seconds    Standing on One Leg  Unable to try or needs  assist to prevent fall    Total Score  32      Timed Up and Go Test   TUG  Normal TUG    Normal TUG (seconds)  80   with large based quad cane     Exercises   Exercises  Other Exercises    Other Exercises   Pt performed seated LAQ x 10 bilateral LE with verbal cues to move through all range and control. Supine heel slides with CGA on left to help keep hip in neutral position. Supine left hip abd x 10 with min assist under heel to reduce friction. Instructed pt she could use plastic bag under foot at home to slide better. Pt denied any knee pain with exercises.             PT Education - 06/10/19 1427    Education Details  Pt was given HEP to begin. PT instructed on benefit of ROM to help decrease pain/stiffness in joints by helping to lubricate joints.    Person(s) Educated  Patient    Methods  Explanation;Demonstration;Handout    Comprehension  Verbalized understanding       PT Short Term Goals - 06/10/19 1434      PT SHORT TERM GOAL #1   Title  Patient will be independent with initial HEP in order to build upon functional gains in therapy. ALL STGS DUE 07/06/19    Time  4    Period  Weeks    Status  New    Target Date  07/06/19      PT SHORT TERM GOAL #2   Title  Patient will undergo assessment of BERG and TUG to address fall risk - LTG written as appropriate.    Baseline  Berg score 32/56 and TUG 80 sec completed on 06/10/19.    Time  4    Period  Weeks    Status  Achieved      PT SHORT TERM GOAL #3   Title  Patient will report worst R knee pain to a 4/10 or less in order to improve tolerance for ambulation at home.    Baseline  10/10 on 06/02/19    Time  4    Period   Weeks    Status  New      PT SHORT TERM GOAL #4   Title  Patient will ambulate at least 10' with quad base cane and supervision in order to improve functional ambulation at home.    Baseline  limited to 30' at eval with R knee immobilizer due to increased R knee pain    Time  4    Period  Weeks    Status  New        PT Long Term Goals - 06/10/19 1435      PT LONG TERM GOAL #1   Title  Patient will be independent with final HEP in order to build upon functional gains in therapy. ALL LTGS DUE 08/05/19    Time  8    Period  Weeks    Status  New      PT LONG TERM GOAL #2   Title  Patient will improve baseline BERG score by at least 6 points in order to demo decreased fall risk.    Baseline  32/56 on 06/10/19    Time  8    Period  Weeks    Status  New      PT LONG TERM GOAL #3   Title  Pt will decrease TUG from 80 sec to <60 sec for improved balance and decreased fall risk.    Baseline  80 sec on 06/10/19    Time  8    Period  Weeks    Status  New      PT LONG TERM GOAL #4   Title  Patient will ambulate at least 150' indoors and outdoors with quad base cane and supervision  in order to improve tolerance for walking in and out of doctors appointments    Time  8    Period  Weeks    Status  New      PT LONG TERM GOAL #5   Title  Patient will decrease 5x sit <> stand time to at least 20 seconds with single UE assist from low mat table in order to demo improved LE strength.    Baseline  41.19 seconds from high mat table, RUE pushing from quad cane.    Time  8    Period  Weeks    Status  New            Plan - 06/10/19 1429    Clinical Impression Statement  PT was able to complete balance testing today. Berg score of 32/56 and TUG of 80 sec indicate high fall risk. Pt only having pain in right knee with weightbearing activities. PT started on seated and supine ROM/strengthening exercises.    Personal Factors and Comorbidities  Comorbidity 3+;Past/Current Experience;Time  since onset of injury/illness/exacerbation    Comorbidities  rt CVA (2016, residual left sided weakness), lt transmet amputation 05/28/18, Sleep apnea, COPD, smoker, DM, Bipolar, HTN, anxiety, depression    Examination-Activity Limitations  Stand;Locomotion Level;Transfers    Examination-Participation Restrictions  Community Activity;Meal Prep;Cleaning    Stability/Clinical Decision Making  Evolving/Moderate complexity    Rehab Potential  Fair    PT Frequency  2x / week    PT Duration  4 weeks   followed by 1x week for 4 weeks   PT Treatment/Interventions  ADLs/Self Care Home Management;Moist Heat;Gait training;DME Instruction;Stair training;Therapeutic activities;Functional mobility training;Therapeutic exercise;Balance training;Neuromuscular re-education;Patient/family education;Passive range of motion    PT Next Visit Plan  Continue with LE strengthening/ROM, see how initial HEP is going.  Begin standing balance activities, gait training.    Consulted and Agree with Plan of Care  Patient       Patient will benefit from skilled therapeutic intervention in order to improve the following deficits and impairments:  Abnormal gait, Decreased balance, Decreased activity tolerance, Decreased endurance, Decreased mobility, Decreased range of motion, Decreased strength, Difficulty walking, Decreased coordination, Pain  Visit Diagnosis: Difficulty in walking, not elsewhere classified  Gait abnormality  Muscle weakness (generalized)     Problem List Patient Active Problem List   Diagnosis Date Noted  . Snoring 02/19/2019  . Hemorrhagic stroke ([place])   . Diastolic dysfunction   . Diabetes mellitus type 2 in obese ([place])   . Anemia of chronic disease   . Chronic obstructive pulmonary disease ([place])   . ICH (intracerebral hemorrhage) ([place]) 08/30/2018  . Hypertensive heart disease without heart failure 07/04/2018  . Nonrheumatic aortic valve insufficiency 07/04/2018  . Esophageal abnormality    . Class 2 severe obesity due to excess calories with serious comorbidity and body mass index (BMI) of 35.0 to 35.9 in adult [facility])   . Peripheral vascular disease ([place])   . History of osteomyelitis   . History of amputation of lesser toe ([place])   . Pulmonary embolism (  [place]) 06/22/2018  . Microcytic anemia 06/17/2018  . At risk for adverse drug reaction 06/11/2018  . Critical lower limb ischemia 05/27/2018  . GI bleeding 04/02/2018  . Acute renal failure (ARF) ([place]) 04/02/2018  . Hypokalemia 04/02/2018  . Polysubstance abuse ([place]) 04/02/2018  . Diabetic foot ulcer ([place]) 04/02/2018  . Cannabis use disorder, moderate, dependence ([place]) 05/19/2017  . Cocaine use disorder, moderate, dependence ([place]) 05/19/2017  . Schizoaffective disorder, bipolar type ([place]) 10/07/2015  . OSA (obstructive sleep apnea) 06/09/2015  . Vitamin D deficiency 01/06/2015  . Bilateral knee pain 01/05/2015  . Numbness and tingling in right hand 01/05/2015  . Insomnia 12/07/2014  . Right-sided lacunar infarction ([place]) 09/15/2014  . Left hemiparesis ([place]) 09/15/2014  . Dyspnea   . Back pain 09/11/2013  . Psychoactive substance-induced organic mood disorder ([place]) 05/28/2013  . Cocaine abuse with cocaine-induced mood disorder ([place]) 05/28/2013  . Cannabis dependence with cannabis-induced anxiety disorder ([place]) 05/28/2013  . Morbid obesity with BMI of 45.0-49.9, adult ([place]) 04/25/2012  . Chest pain 04/17/2012  . Hypertension 04/17/2012  . DM (diabetes mellitus) with peripheral vascular complication ([place]) [id number]  . Hyperlipidemia 04/17/2012  . Tobacco use 04/17/2012    Electa Sniff, PT, DPT, NCS 06/10/2019, 2:38 PM  [facility] [address] [place], [place], [address] Phone: [phone]   Fax:  [phone]  Name: Diamond Rice MRN: [id number] Date of Birth: 03-Jan-1971

## 2019-06-10 NOTE — Patient Instructions (Signed)
Access Code: [email or web address]  URL: [email or web address]  Date: 06/10/2019  Prepared by: Cherly Anderson   Exercises Seated Long Arc Quad - 10 reps - 2 sets - 1x daily - 5x weekly Supine Heel Slide - 10 reps - 2 sets - 1x daily - 5x weekly Supine Hip Abduction - 5 reps - 2 sets - 1x daily - 5x weekly Seated wrist stretch - 3 sets - 30 sec hold - 2x daily - 7x weekly

## 2019-06-18 ENCOUNTER — Other Ambulatory Visit: Payer: Self-pay

## 2019-06-18 ENCOUNTER — Ambulatory Visit: Payer: Medicare Other | Admitting: Physical Therapy

## 2019-06-18 ENCOUNTER — Encounter: Payer: Self-pay | Admitting: Physical Therapy

## 2019-06-18 DIAGNOSIS — M6281 Muscle weakness (generalized): Secondary | ICD-10-CM

## 2019-06-18 DIAGNOSIS — R29818 Other symptoms and signs involving the nervous system: Secondary | ICD-10-CM

## 2019-06-18 DIAGNOSIS — R269 Unspecified abnormalities of gait and mobility: Secondary | ICD-10-CM

## 2019-06-18 DIAGNOSIS — R262 Difficulty in walking, not elsewhere classified: Secondary | ICD-10-CM

## 2019-06-19 NOTE — Therapy (Signed)
[facility] [address] [place], [place], [address] Phone: [phone]   Fax:  [phone]  Physical Therapy Treatment  Patient Details  Name: Diamond Rice MRN: [id number] Date of Birth: Dec 24, 1970 Referring Provider (PT): Johney Maine, MD   Encounter Date: 06/18/2019  PT End of Session - 06/19/19 [id number]    Visit Number  3    Number of Visits  13    Date for PT Re-Evaluation  08/31/19   written for 60 day POC   Authorization Type  Medicare & Medicaid    PT Start Time  1532    PT Stop Time  [id number]    PT Time Calculation (min)  42 min    Equipment Utilized During Treatment  Other (comment)   pt had right knee brace   Activity Tolerance  Patient limited by pain    Behavior During Therapy  [facility] for tasks assessed/performed       Past Medical History:  Diagnosis Date  . Anxiety   . Chronic lower back pain 04/17/2012   "just got over some; catched when I walked"  . COPD (chronic obstructive pulmonary disease) ([place])   . Critical lower limb ischemia 05/27/2018  . Depression   . Headache(784.0) 04/17/2012   "~ qod; lately waking up in am w/one"  . High cholesterol   . Hypertension   . Migraines 04/17/2012  . Obesity   . Sleep apnea    CPAP  . Stroke ([place])   . Type II diabetes mellitus ([place]) 08/28/2002    Past Surgical History:  Procedure Laterality Date  . ABDOMINAL AORTOGRAM W/LOWER EXTREMITY Left 05/27/2018   Procedure: ABDOMINAL AORTOGRAM W/LOWER EXTREMITY Runoff and Possible Intervention;  Surgeon: Waynetta Sandy, MD;  Location: [facility];  Service: Cardiovascular;  Laterality: Left;  . APPLICATION OF WOUND VAC Left 06/06/2018   Procedure: APPLICATION OF WOUND VAC;  Surgeon: Waynetta Sandy, MD;  Location: [place] [facility];  Service: Vascular;  Laterality: Left;  . BIOPSY  04/04/2018   Procedure: BIOPSY;  Surgeon: [name], MD;  Location: [facility] ENDOSCOPY;  Service: Endoscopy;;  .  CARDIAC CATHETERIZATION  ~ 2011  . COLONOSCOPY N/A 04/04/2018   Procedure: COLONOSCOPY;  Surgeon: [name], MD;  Location: [facility] ENDOSCOPY;  Service: Endoscopy;  Laterality: N/A;  . LOWER EXTREMITY ANGIOGRAM Left 05/27/2018  . PERIPHERAL VASCULAR INTERVENTION Left 05/27/2018   Procedure: PERIPHERAL VASCULAR INTERVENTION;  Surgeon: Waynetta Sandy, MD;  Location: [place] [facility];  Service: Cardiovascular;  Laterality: Left;  . POLYPECTOMY  04/04/2018   Procedure: POLYPECTOMY;  Surgeon: [name], MD;  Location: [facility] ENDOSCOPY;  Service: Endoscopy;;  . TRANSMETATARSAL AMPUTATION Left 05/28/2018   Procedure: AMPUTATION TOES THREE, FOUR AND FIVE on left foot;  Surgeon: Waynetta Sandy, MD;  Location: [place];  Service: Vascular;  Laterality: Left;  . WOUND DEBRIDEMENT Left 06/06/2018   Procedure: DEBRIDEMENT WOUND LEFT FOOT;  Surgeon: Waynetta Sandy, MD;  Location: [place];  Service: Vascular;  Laterality: Left;    There were no vitals filed for this visit.  Subjective Assessment - 06/18/19 1537    Subjective  Got a shot in right knee and right elbow today for pain. Reports her knee is not hurting as bad. Her L elbow is really hurting. No falls. States that the exercises have been going well and has been doing them at home. Using her RW to walk.    Pertinent History  PMH: rt CVA (2016, residual left  sided weakness), lt transmet amputation 05/28/18, Sleep apnea, COPD, smoker, DM, Bipolar, HTN, anxiety, depression.    Patient Stated Goals  To strengthen left side    Currently in Pain?  Yes    Pain Score  10-Worst pain ever    Pain Location  --   right elbow/right arm   Pain Orientation  Right    Pain Descriptors / Indicators  Aching    Pain Type  Acute pain   has been going on for about a week or so   Aggravating Factors   everything    Pain Relieving Factors  nothing makes it bette                       [facility] Adult PT Treatment/Exercise - 06/18/19  1606      Transfers   Transfers  Sit to Stand;Stand to Sit    Sit to Stand  5: Supervision    Sit to Stand Details  Verbal cues for technique    Comments  2 x 10 reps from higher mat table with no UE support, cues for glute squeeze at the top.       Exercises   Exercises  Other Exercises    Other Exercises   In supine: 4 x 5 reps active SLR on L, cues for quad set first. Attempted 1 x 5 reps on RLE, pt reporting increased pain after performing. Attempted bridging, unable to perform due to increased pain. 2 x 10 reps bent knee fall outs B with red theraband. 4 x 5 reps resisted hooklying hip flexion on L with red theraband. Seated at edge of mat 2 x 10 reps resisted L hamstring curls with red theraband. Standing with single UE support on chair: 1 x 10 reps marching, with BUE support: 1 x 10 reps heel raises.       Modalities   Modalities  Cryotherapy      Cryotherapy   Number Minutes Cryotherapy  15 Minutes   during supine ther-ex    Cryotherapy Location  --   L elbow   Type of Cryotherapy  Ice pack             PT Education - 06/19/19 0831    Education Details  use of ice for R elbow pain - with application of [id number] minutes    Person(s) Educated  Patient    Methods  Explanation;Demonstration    Comprehension  Verbalized understanding       PT Short Term Goals - 06/10/19 1434      PT SHORT TERM GOAL #1   Title  Patient will be independent with initial HEP in order to build upon functional gains in therapy. ALL STGS DUE 07/06/19    Time  4    Period  Weeks    Status  New    Target Date  07/06/19      PT SHORT TERM GOAL #2   Title  Patient will undergo assessment of BERG and TUG to address fall risk - LTG written as appropriate.    Baseline  Berg score 32/56 and TUG 80 sec completed on 06/10/19.    Time  4    Period  Weeks    Status  Achieved      PT SHORT TERM GOAL #3   Title  Patient will report worst R knee pain to a 4/10 or less in order to improve tolerance for  ambulation at home.    Baseline  10/10 on 06/02/19    Time  4    Period  Weeks    Status  New      PT SHORT TERM GOAL #4   Title  Patient will ambulate at least 82' with quad base cane and supervision in order to improve functional ambulation at home.    Baseline  limited to 30' at eval with R knee immobilizer due to increased R knee pain    Time  4    Period  Weeks    Status  New        PT Long Term Goals - 06/10/19 1435      PT LONG TERM GOAL #1   Title  Patient will be independent with final HEP in order to build upon functional gains in therapy. ALL LTGS DUE 08/05/19    Time  8    Period  Weeks    Status  New      PT LONG TERM GOAL #2   Title  Patient will improve baseline BERG score by at least 6 points in order to demo decreased fall risk.    Baseline  32/56 on 06/10/19    Time  8    Period  Weeks    Status  New      PT LONG TERM GOAL #3   Title  Pt will decrease TUG from 80 sec to <60 sec for improved balance and decreased fall risk.    Baseline  80 sec on 06/10/19    Time  8    Period  Weeks    Status  New      PT LONG TERM GOAL #4   Title  Patient will ambulate at least 150' indoors and outdoors with quad base cane and supervision  in order to improve tolerance for walking in and out of doctors appointments    Time  8    Period  Weeks    Status  New      PT LONG TERM GOAL #5   Title  Patient will decrease 5x sit <> stand time to at least 20 seconds with single UE assist from low mat table in order to demo improved LE strength.    Baseline  41.19 seconds from high mat table, RUE pushing from quad cane.    Time  8    Period  Weeks    Status  New            Plan - 06/19/19 [id number]    Clinical Impression Statement  Pt reported increased R elbow and R knee pain at beginning of session. Applied ice pack to R elbow during supine ther-ex - pt reported 0000000 after application, however once in sitting again pt reported 10/10 pain. Pt able to tolerate weight  bearing exercises today with sit <> stands and standing LE strengthening. Will continue to progress towards LTGs.    Personal Factors and Comorbidities  Comorbidity 3+;Past/Current Experience;Time since onset of injury/illness/exacerbation    Comorbidities  rt CVA (2016, residual left sided weakness), lt transmet amputation 05/28/18, Sleep apnea, COPD, smoker, DM, Bipolar, HTN, anxiety, depression    Examination-Activity Limitations  Stand;Locomotion Level;Transfers    Examination-Participation Restrictions  Community Activity;Meal Prep;Cleaning    Stability/Clinical Decision Making  Evolving/Moderate complexity    Rehab Potential  Fair    PT Frequency  2x / week    PT Duration  4 weeks   followed by 1x week for 4 weeks   PT Treatment/Interventions  ADLs/Self Care  Home Management;Moist Heat;Gait training;DME Instruction;Stair training;Therapeutic activities;Functional mobility training;Therapeutic exercise;Balance training;Neuromuscular re-education;Patient/family education;Passive range of motion    PT Next Visit Plan  Continue with LE strengthening/ROM, add to initial exercise program if appropriate. Begin standing balance activities, gait training. See if pt can tolerate NuStep/SciFit with BLE for ROM/endurance/strength.    Consulted and Agree with Plan of Care  Patient       Patient will benefit from skilled therapeutic intervention in order to improve the following deficits and impairments:  Abnormal gait, Decreased balance, Decreased activity tolerance, Decreased endurance, Decreased mobility, Decreased range of motion, Decreased strength, Difficulty walking, Decreased coordination, Pain  Visit Diagnosis: Difficulty in walking, not elsewhere classified  Gait abnormality  Muscle weakness (generalized)  Other symptoms and signs involving the nervous system     Problem List Patient Active Problem List   Diagnosis Date Noted  . Snoring 02/19/2019  . Hemorrhagic stroke ([place])   .  Diastolic dysfunction   . Diabetes mellitus type 2 in obese ([place])   . Anemia of chronic disease   . Chronic obstructive pulmonary disease ([place])   . ICH (intracerebral hemorrhage) ([place]) 08/30/2018  . Hypertensive heart disease without heart failure 07/04/2018  . Nonrheumatic aortic valve insufficiency 07/04/2018  . Esophageal abnormality   . Class 2 severe obesity due to excess calories with serious comorbidity and body mass index (BMI) of 35.0 to 35.9 in adult [facility])   . Peripheral vascular disease ([place])   . History of osteomyelitis   . History of amputation of lesser toe ([place])   . Pulmonary embolism ([place]) 06/22/2018  . Microcytic anemia 06/17/2018  . At risk for adverse drug reaction 06/11/2018  . Critical lower limb ischemia 05/27/2018  . GI bleeding 04/02/2018  . Acute renal failure (ARF) ([place]) 04/02/2018  . Hypokalemia 04/02/2018  . Polysubstance abuse ([place]) 04/02/2018  . Diabetic foot ulcer ([place]) 04/02/2018  . Cannabis use disorder, moderate, dependence ([place]) 05/19/2017  . Cocaine use disorder, moderate, dependence ([place]) 05/19/2017  . Schizoaffective disorder, bipolar type ([place]) 10/07/2015  . OSA (obstructive sleep apnea) 06/09/2015  . Vitamin D deficiency 01/06/2015  . Bilateral knee pain 01/05/2015  . Numbness and tingling in right hand 01/05/2015  . Insomnia 12/07/2014  . Right-sided lacunar infarction ([place]) 09/15/2014  . Left hemiparesis ([place]) 09/15/2014  . Dyspnea   . Back pain 09/11/2013  . Psychoactive substance-induced organic mood disorder ([place]) 05/28/2013  . Cocaine abuse with cocaine-induced mood disorder ([place]) 05/28/2013  . Cannabis dependence with cannabis-induced anxiety disorder ([place]) 05/28/2013  . Morbid obesity with BMI of 45.0-49.9, adult ([place]) 04/25/2012  . Chest pain 04/17/2012  . Hypertension 04/17/2012  . DM (diabetes mellitus) with peripheral vascular complication ([place]) [id number]  . Hyperlipidemia 04/17/2012  . Tobacco use 04/17/2012    Arliss Journey, PT, DPT  06/19/2019, 8:37 AM  [facility] [address] [place], [place], [address] Phone: [phone]   Fax:  [phone]  Name: Diamond Rice MRN: [id number] Date of Birth: Mar 21, 1971

## 2019-06-20 ENCOUNTER — Ambulatory Visit: Payer: Medicare Other

## 2019-06-20 ENCOUNTER — Other Ambulatory Visit: Payer: Self-pay

## 2019-06-20 DIAGNOSIS — M6281 Muscle weakness (generalized): Secondary | ICD-10-CM

## 2019-06-20 DIAGNOSIS — R29818 Other symptoms and signs involving the nervous system: Secondary | ICD-10-CM | POA: Diagnosis not present

## 2019-06-20 DIAGNOSIS — R269 Unspecified abnormalities of gait and mobility: Secondary | ICD-10-CM

## 2019-06-20 NOTE — Therapy (Signed)
[facility] [address] [place], [place], [address] Phone: [phone]   Fax:  [phone]  Physical Therapy Treatment  Patient Details  Name: Diamond Rice MRN: [id number] Date of Birth: 1971-04-16 Referring Provider (PT): Johney Maine, MD   Encounter Date: 06/20/2019  PT End of Session - 06/20/19 1405    Visit Number  4    Number of Visits  13    Date for PT Re-Evaluation  08/31/19   written for 60 day POC   Authorization Type  Medicare & Medicaid    PT Start Time  1401    PT Stop Time  1443    PT Time Calculation (min)  42 min    Equipment Utilized During Treatment  Other (comment)   pt had right knee brace   Activity Tolerance  Patient limited by pain    Behavior During Therapy  [facility] for tasks assessed/performed       Past Medical History:  Diagnosis Date  . Anxiety   . Chronic lower back pain 04/17/2012   "just got over some; catched when I walked"  . COPD (chronic obstructive pulmonary disease) ([place])   . Critical lower limb ischemia 05/27/2018  . Depression   . Headache(784.0) 04/17/2012   "~ qod; lately waking up in am w/one"  . High cholesterol   . Hypertension   . Migraines 04/17/2012  . Obesity   . Sleep apnea    CPAP  . Stroke ([place])   . Type II diabetes mellitus ([place]) 08/28/2002    Past Surgical History:  Procedure Laterality Date  . ABDOMINAL AORTOGRAM W/LOWER EXTREMITY Left 05/27/2018   Procedure: ABDOMINAL AORTOGRAM W/LOWER EXTREMITY Runoff and Possible Intervention;  Surgeon: Waynetta Sandy, MD;  Location: [place] [facility];  Service: Cardiovascular;  Laterality: Left;  . APPLICATION OF WOUND VAC Left 06/06/2018   Procedure: APPLICATION OF WOUND VAC;  Surgeon: Waynetta Sandy, MD;  Location: [facility];  Service: Vascular;  Laterality: Left;  . BIOPSY  04/04/2018   Procedure: BIOPSY;  Surgeon: [name], MD;  Location: [facility] ENDOSCOPY;  Service: Endoscopy;;  .  CARDIAC CATHETERIZATION  ~ 2011  . COLONOSCOPY N/A 04/04/2018   Procedure: COLONOSCOPY;  Surgeon: [name], MD;  Location: [facility] ENDOSCOPY;  Service: Endoscopy;  Laterality: N/A;  . LOWER EXTREMITY ANGIOGRAM Left 05/27/2018  . PERIPHERAL VASCULAR INTERVENTION Left 05/27/2018   Procedure: PERIPHERAL VASCULAR INTERVENTION;  Surgeon: Waynetta Sandy, MD;  Location: [place] [facility];  Service: Cardiovascular;  Laterality: Left;  . POLYPECTOMY  04/04/2018   Procedure: POLYPECTOMY;  Surgeon: [name], MD;  Location: [facility] ENDOSCOPY;  Service: Endoscopy;;  . TRANSMETATARSAL AMPUTATION Left 05/28/2018   Procedure: AMPUTATION TOES THREE, FOUR AND FIVE on left foot;  Surgeon: Waynetta Sandy, MD;  Location: [place];  Service: Vascular;  Laterality: Left;  . WOUND DEBRIDEMENT Left 06/06/2018   Procedure: DEBRIDEMENT WOUND LEFT FOOT;  Surgeon: Waynetta Sandy, MD;  Location: [place];  Service: Vascular;  Laterality: Left;    There were no vitals filed for this visit.  Subjective Assessment - 06/20/19 1405    Subjective  Pt reports that she is starting to feel a little better after gel shot  on Wed and returns next Wednesday for another shot. Has started using walker.    Pertinent History  PMH: rt CVA (2016, residual left sided weakness), lt transmet amputation 05/28/18, Sleep apnea, COPD, smoker, DM, Bipolar, HTN, anxiety, depression.    Patient Stated  Goals  To strengthen left side    Currently in Pain?  Yes    Pain Score  5     Pain Location  Knee    Pain Orientation  Right    Pain Descriptors / Indicators  Aching    Pain Type  Acute pain    Multiple Pain Sites  Yes    Pain Score  7    Pain Location  Elbow    Pain Orientation  Right    Pain Descriptors / Indicators  Aching    Pain Type  Acute pain                       [facility] Adult PT Treatment/Exercise - 06/20/19 1426      Ambulation/Gait   Ambulation/Gait  Yes    Ambulation/Gait Assistance  6:  Modified independent (Device/Increase time)    Ambulation Distance (Feet)  70 Feet    Assistive device  Rolling walker    Gait Pattern  Step-through pattern    Ambulation Surface  Level;Indoor    Gait Comments  Verbal cues for increase step length      Neuro Re-ed    Neuro Re-ed Details   Right elbow grade 3 lateral mobilization 3 bouts of 20 sec. Pain down to 3/10 after. Standing with feet together without UE support x 30 sec eyes closed, staggered stance x 30 sec each position with eyes closed, Step-up on 2" step with left LE x 2 then switched to 4" step x 10 forward and x 10 latereal with verbal and tactile cuing at left knee to prevent hyperextension,       Exercises   Exercises  Other Exercises    Other Exercises   Sit to stand 5 x 2 from edge of mat without UE support. Seated hamstring curls 10 x 2 with manual resistance. NuStep x 5 min level 3 with LE only.             PT Education - 06/20/19 1603    Education Details  Added to strengthening HEP    Person(s) Educated  Patient    Methods  Explanation;Handout    Comprehension  Verbalized understanding;Returned demonstration       PT Short Term Goals - 06/10/19 1434      PT SHORT TERM GOAL #1   Title  Patient will be independent with initial HEP in order to build upon functional gains in therapy. ALL STGS DUE 07/06/19    Time  4    Period  Weeks    Status  New    Target Date  07/06/19      PT SHORT TERM GOAL #2   Title  Patient will undergo assessment of BERG and TUG to address fall risk - LTG written as appropriate.    Baseline  Berg score 32/56 and TUG 80 sec completed on 06/10/19.    Time  4    Period  Weeks    Status  Achieved      PT SHORT TERM GOAL #3   Title  Patient will report worst R knee pain to a 4/10 or less in order to improve tolerance for ambulation at home.    Baseline  10/10 on 06/02/19    Time  4    Period  Weeks    Status  New      PT SHORT TERM GOAL #4   Title  Patient will ambulate at  least 55' with quad base  cane and supervision in order to improve functional ambulation at home.    Baseline  limited to 30' at eval with R knee immobilizer due to increased R knee pain    Time  4    Period  Weeks    Status  New        PT Long Term Goals - 06/10/19 1435      PT LONG TERM GOAL #1   Title  Patient will be independent with final HEP in order to build upon functional gains in therapy. ALL LTGS DUE 08/05/19    Time  8    Period  Weeks    Status  New      PT LONG TERM GOAL #2   Title  Patient will improve baseline BERG score by at least 6 points in order to demo decreased fall risk.    Baseline  32/56 on 06/10/19    Time  8    Period  Weeks    Status  New      PT LONG TERM GOAL #3   Title  Pt will decrease TUG from 80 sec to <60 sec for improved balance and decreased fall risk.    Baseline  80 sec on 06/10/19    Time  8    Period  Weeks    Status  New      PT LONG TERM GOAL #4   Title  Patient will ambulate at least 150' indoors and outdoors with quad base cane and supervision  in order to improve tolerance for walking in and out of doctors appointments    Time  8    Period  Weeks    Status  New      PT LONG TERM GOAL #5   Title  Patient will decrease 5x sit <> stand time to at least 20 seconds with single UE assist from low mat table in order to demo improved LE strength.    Baseline  41.19 seconds from high mat table, RUE pushing from quad cane.    Time  8    Period  Weeks    Status  New            Plan - 06/20/19 1552    Clinical Impression Statement  Pt with less pain in right knee today. Was walking with walker today and had more reciprocal gait pattern with less antalgia. Pt able to tolerate more strengthening in standing and balance activities.    Personal Factors and Comorbidities  Comorbidity 3+;Past/Current Experience;Time since onset of injury/illness/exacerbation    Comorbidities  rt CVA (2016, residual left sided weakness), lt transmet  amputation 05/28/18, Sleep apnea, COPD, smoker, DM, Bipolar, HTN, anxiety, depression    Examination-Activity Limitations  Stand;Locomotion Level;Transfers    Examination-Participation Restrictions  Community Activity;Meal Prep;Cleaning    Stability/Clinical Decision Making  Evolving/Moderate complexity    Rehab Potential  Fair    PT Frequency  2x / week    PT Duration  4 weeks   followed by 1x week for 4 weeks   PT Treatment/Interventions  ADLs/Self Care Home Management;Moist Heat;Gait training;DME Instruction;Stair training;Therapeutic activities;Functional mobility training;Therapeutic exercise;Balance training;Neuromuscular re-education;Patient/family education;Passive range of motion    PT Next Visit Plan  Continue with LE strengthening/ROM, add to initial exercise program if appropriate. Continue standing balance and gait training.    Consulted and Agree with Plan of Care  Patient       Patient will benefit from skilled therapeutic intervention in order to improve  the following deficits and impairments:  Abnormal gait, Decreased balance, Decreased activity tolerance, Decreased endurance, Decreased mobility, Decreased range of motion, Decreased strength, Difficulty walking, Decreased coordination, Pain  Visit Diagnosis: Gait abnormality  Muscle weakness (generalized)     Problem List Patient Active Problem List   Diagnosis Date Noted  . Snoring 02/19/2019  . Hemorrhagic stroke ([place])   . Diastolic dysfunction   . Diabetes mellitus type 2 in obese ([place])   . Anemia of chronic disease   . Chronic obstructive pulmonary disease ([place])   . ICH (intracerebral hemorrhage) ([place]) 08/30/2018  . Hypertensive heart disease without heart failure 07/04/2018  . Nonrheumatic aortic valve insufficiency 07/04/2018  . Esophageal abnormality   . Class 2 severe obesity due to excess calories with serious comorbidity and body mass index (BMI) of 35.0 to 35.9 in adult [facility])   . Peripheral vascular  disease ([place])   . History of osteomyelitis   . History of amputation of lesser toe ([place])   . Pulmonary embolism ([place]) 06/22/2018  . Microcytic anemia 06/17/2018  . At risk for adverse drug reaction 06/11/2018  . Critical lower limb ischemia 05/27/2018  . GI bleeding 04/02/2018  . Acute renal failure (ARF) ([place]) 04/02/2018  . Hypokalemia 04/02/2018  . Polysubstance abuse ([place]) 04/02/2018  . Diabetic foot ulcer ([place]) 04/02/2018  . Cannabis use disorder, moderate, dependence ([place]) 05/19/2017  . Cocaine use disorder, moderate, dependence ([place]) 05/19/2017  . Schizoaffective disorder, bipolar type ([place]) 10/07/2015  . OSA (obstructive sleep apnea) 06/09/2015  . Vitamin D deficiency 01/06/2015  . Bilateral knee pain 01/05/2015  . Numbness and tingling in right hand 01/05/2015  . Insomnia 12/07/2014  . Right-sided lacunar infarction ([place]) 09/15/2014  . Left hemiparesis ([place]) 09/15/2014  . Dyspnea   . Back pain 09/11/2013  . Psychoactive substance-induced organic mood disorder ([place]) 05/28/2013  . Cocaine abuse with cocaine-induced mood disorder ([place]) 05/28/2013  . Cannabis dependence with cannabis-induced anxiety disorder ([place]) 05/28/2013  . Morbid obesity with BMI of 45.0-49.9, adult ([place]) 04/25/2012  . Chest pain 04/17/2012  . Hypertension 04/17/2012  . DM (diabetes mellitus) with peripheral vascular complication ([place]) [id number]  . Hyperlipidemia 04/17/2012  . Tobacco use 04/17/2012    Electa Sniff, PT, DPT, NCS 06/20/2019, 4:05 PM  [facility] [address] [place], [place], [address] Phone: [phone]   Fax:  [phone]  Name: Diamond Rice MRN: [id number] Date of Birth: 04/15/1971

## 2019-06-20 NOTE — Patient Instructions (Signed)
Access Code: [email or web address]  URL: [email or web address]  Date: 06/20/2019  Prepared by: Cherly Anderson   Exercises Seated Long Arc Quad - 10 reps - 2 sets - 1x daily - 5x weekly Supine Heel Slide - 10 reps - 2 sets - 1x daily - 5x weekly Supine Hip Abduction - 5 reps - 2 sets - 1x daily - 5x weekly Seated wrist stretch - 3 sets - 30 sec hold - 2x daily - 7x weekly Seated Heel Slide - 10 reps - 2 sets - 1x daily - 5x weekly Sit to Stand - 5 reps - 2 sets - 1x daily - 7x weekly

## 2019-06-25 ENCOUNTER — Ambulatory Visit: Payer: Medicare Other

## 2019-06-25 ENCOUNTER — Other Ambulatory Visit: Payer: Self-pay

## 2019-06-25 VITALS — BP 160/94

## 2019-06-25 DIAGNOSIS — R269 Unspecified abnormalities of gait and mobility: Secondary | ICD-10-CM

## 2019-06-25 DIAGNOSIS — R29818 Other symptoms and signs involving the nervous system: Secondary | ICD-10-CM | POA: Diagnosis not present

## 2019-06-25 NOTE — Therapy (Signed)
[facility] [address] [place], [place], [address] Phone: [phone]   Fax:  [phone]  Physical Therapy Treatment  Patient Details  Name: Diamond Rice MRN: [id number] Date of Birth: [birth year] Referring Provider (PT): Johney Maine, MD   Encounter Date: 06/25/2019  PT End of Session - 06/25/19 1455    Visit Number  5    Number of Visits  13    Date for PT Re-Evaluation  08/31/19   written for 60 day POC   Authorization Type  Medicare & Medicaid    PT Start Time  1447    PT Stop Time  1520   stopped early due to elevated BP   PT Time Calculation (min)  33 min    Equipment Utilized During Treatment  Other (comment)   pt had right knee brace and right wrist brace   Activity Tolerance  Patient limited by pain    Behavior During Therapy  [facility] for tasks assessed/performed       Past Medical History:  Diagnosis Date  . Anxiety   . Chronic lower back pain 04/17/2012   "just got over some; catched when I walked"  . COPD (chronic obstructive pulmonary disease) ([place])   . Critical lower limb ischemia 05/27/2018  . Depression   . Headache(784.0) 04/17/2012   "~ qod; lately waking up in am w/one"  . High cholesterol   . Hypertension   . Migraines 04/17/2012  . Obesity   . Sleep apnea    CPAP  . Stroke ([place])   . Type II diabetes mellitus ([place]) 08/28/2002    Past Surgical History:  Procedure Laterality Date  . ABDOMINAL AORTOGRAM W/LOWER EXTREMITY Left 05/27/2018   Procedure: ABDOMINAL AORTOGRAM W/LOWER EXTREMITY [place] and Possible Intervention;  Surgeon: Waynetta Sandy, MD;  Location: [facility];  Service: Cardiovascular;  Laterality: Left;  . APPLICATION OF WOUND VAC Left 06/06/2018   Procedure: APPLICATION OF WOUND VAC;  Surgeon: Waynetta Sandy, MD;  Location: [facility];  Service: Vascular;  Laterality: Left;  . BIOPSY  04/04/2018   Procedure: BIOPSY;  Surgeon: [name], MD;   Location: [facility] ENDOSCOPY;  Service: Endoscopy;;  . CARDIAC CATHETERIZATION  ~ 2011  . COLONOSCOPY N/A 04/04/2018   Procedure: COLONOSCOPY;  Surgeon: [name], MD;  Location: [facility] ENDOSCOPY;  Service: Endoscopy;  Laterality: N/A;  . LOWER EXTREMITY ANGIOGRAM Left 05/27/2018  . PERIPHERAL VASCULAR INTERVENTION Left 05/27/2018   Procedure: PERIPHERAL VASCULAR INTERVENTION;  Surgeon: Waynetta Sandy, MD;  Location: [facility];  Service: Cardiovascular;  Laterality: Left;  . POLYPECTOMY  04/04/2018   Procedure: POLYPECTOMY;  Surgeon: [name], MD;  Location: [facility] ENDOSCOPY;  Service: Endoscopy;;  . TRANSMETATARSAL AMPUTATION Left 05/28/2018   Procedure: AMPUTATION TOES THREE, FOUR AND FIVE on left foot;  Surgeon: Waynetta Sandy, MD;  Location: [facility];  Service: Vascular;  Laterality: Left;  . WOUND DEBRIDEMENT Left 06/06/2018   Procedure: DEBRIDEMENT WOUND LEFT FOOT;  Surgeon: Waynetta Sandy, MD;  Location: [facility];  Service: Vascular;  Laterality: Left;    Vitals:   06/25/19 1452  BP: (!) 160/94    Subjective Assessment - 06/25/19 1452    Subjective  Pt had 2nd injection in  right knee today and reports knee has been doing well. They gave her a compression strap to add to right upper forearm. She is also wearing the wrist brace they gave her today.    Pertinent History  PMH:  rt CVA (2016, residual left sided weakness), lt transmet amputation 05/28/18, Sleep apnea, COPD, smoker, DM, Bipolar, HTN, anxiety, depression.    Patient Stated Goals  To strengthen left side    Currently in Pain?  Yes    Pain Score  3     Pain Location  Knee    Pain Orientation  Right    Pain Descriptors / Indicators  Aching    Pain Type  Acute pain    Pain Onset  1 to 4 weeks ago    Pain Frequency  Intermittent    Pain Score  6    Pain Location  Elbow    Pain Orientation  Right    Pain Descriptors / Indicators  Aching    Pain Type  Acute pain    Pain Onset  1 to 4 weeks ago                        [facility] Adult PT Treatment/Exercise - 06/25/19 1508      Ambulation/Gait   Ambulation/Gait  Yes    Ambulation/Gait Assistance  5: Supervision    Ambulation/Gait Assistance Details  Verbal cues to increase left hip flexion and heel strike.    Ambulation Distance (Feet)  230 Feet    Assistive device  Rolling walker    Gait Pattern  Step-through pattern    Ambulation Surface  Level;Indoor    Gait Comments  BP=178/105 after gait. Pt reports she is due for next dose of BP meds. Rechecked BP after sitting to rest for 5 min and BP still 180/104. Pt had no other symptoms-denies headache. Advised to go home and relax and take BP meds as soon as gets home.             PT Education - 06/25/19 1525    Education Details  Pt instructed to take it easy when gets home and to take BP meds as soon as gets home. Roommate will be there and taking transportation home.    Person(s) Educated  Patient    Methods  Explanation    Comprehension  Verbalized understanding       PT Short Term Goals - 06/10/19 1434      PT SHORT TERM GOAL #1   Title  Patient will be independent with initial HEP in order to build upon functional gains in therapy. ALL STGS DUE 07/06/19    Time  4    Period  Weeks    Status  New    Target Date  07/06/19      PT SHORT TERM GOAL #2   Title  Patient will undergo assessment of BERG and TUG to address fall risk - LTG written as appropriate.    Baseline  Berg score 32/56 and TUG 80 sec completed on 06/10/19.    Time  4    Period  Weeks    Status  Achieved      PT SHORT TERM GOAL #3   Title  Patient will report worst R knee pain to a 4/10 or less in order to improve tolerance for ambulation at home.    Baseline  10/10 on 06/02/19    Time  4    Period  Weeks    Status  New      PT SHORT TERM GOAL #4   Title  Patient will ambulate at least 47' with quad base cane and supervision in order to improve functional ambulation at home.  Baseline  limited to 30' at eval with R knee immobilizer due to increased R knee pain    Time  4    Period  Weeks    Status  New        PT Long Term Goals - 06/10/19 1435      PT LONG TERM GOAL #1   Title  Patient will be independent with final HEP in order to build upon functional gains in therapy. ALL LTGS DUE 08/05/19    Time  8    Period  Weeks    Status  New      PT LONG TERM GOAL #2   Title  Patient will improve baseline BERG score by at least 6 points in order to demo decreased fall risk.    Baseline  32/56 on 06/10/19    Time  8    Period  Weeks    Status  New      PT LONG TERM GOAL #3   Title  Pt will decrease TUG from 80 sec to <60 sec for improved balance and decreased fall risk.    Baseline  80 sec on 06/10/19    Time  8    Period  Weeks    Status  New      PT LONG TERM GOAL #4   Title  Patient will ambulate at least 150' indoors and outdoors with quad base cane and supervision  in order to improve tolerance for walking in and out of doctors appointments    Time  8    Period  Weeks    Status  New      PT LONG TERM GOAL #5   Title  Patient will decrease 5x sit <> stand time to at least 20 seconds with single UE assist from low mat table in order to demo improved LE strength.    Baseline  41.19 seconds from high mat table, RUE pushing from quad cane.    Time  8    Period  Weeks    Status  New            Plan - 06/25/19 1514    Clinical Impression Statement  Pt's treatment limited today due to elevated BP. Pt was asymptomatic otherwise and taking transportation home. Will take BP meds as soon as she gets home. Pt had more reciprocal gait pattern with gait today with less antalgia.    Personal Factors and Comorbidities  Comorbidity 3+;Past/Current Experience;Time since onset of injury/illness/exacerbation    Comorbidities  rt CVA (2016, residual left sided weakness), lt transmet amputation 05/28/18, Sleep apnea, COPD, smoker, DM, Bipolar, HTN, anxiety,  depression    Examination-Activity Limitations  Stand;Locomotion Level;Transfers    Examination-Participation Restrictions  Community Activity;Meal Prep;Cleaning    Stability/Clinical Decision Making  Evolving/Moderate complexity    Rehab Potential  Fair    PT Frequency  2x / week    PT Duration  4 weeks   followed by 1x week for 4 weeks   PT Treatment/Interventions  ADLs/Self Care Home Management;Moist Heat;Gait training;DME Instruction;Stair training;Therapeutic activities;Functional mobility training;Therapeutic exercise;Balance training;Neuromuscular re-education;Patient/family education;Passive range of motion    PT Next Visit Plan  Monitor BP. Continue with LE strengthening/ROM, add to initial exercise program if appropriate. Continue standing balance and gait training.    Consulted and Agree with Plan of Care  Patient       Patient will benefit from skilled therapeutic intervention in order to improve the following deficits and impairments:  Abnormal  gait, Decreased balance, Decreased activity tolerance, Decreased endurance, Decreased mobility, Decreased range of motion, Decreased strength, Difficulty walking, Decreased coordination, Pain  Visit Diagnosis: Gait abnormality     Problem List Patient Active Problem List   Diagnosis Date Noted  . Snoring 02/19/2019  . Hemorrhagic stroke ([place])   . Diastolic dysfunction   . Diabetes mellitus type 2 in obese ([place])   . Anemia of chronic disease   . Chronic obstructive pulmonary disease ([place])   . ICH (intracerebral hemorrhage) ([place]) 08/30/2018  . Hypertensive heart disease without heart failure 07/04/2018  . Nonrheumatic aortic valve insufficiency 07/04/2018  . Esophageal abnormality   . Class 2 severe obesity due to excess calories with serious comorbidity and body mass index (BMI) of 35.0 to 35.9 in adult [facility])   . Peripheral vascular disease ([place])   . History of osteomyelitis   . History of amputation of lesser toe ([place])   .  Pulmonary embolism ([place]) 06/22/2018  . Microcytic anemia 06/17/2018  . At risk for adverse drug reaction 06/11/2018  . Critical lower limb ischemia 05/27/2018  . GI bleeding 04/02/2018  . Acute renal failure (ARF) ([place]) 04/02/2018  . Hypokalemia 04/02/2018  . Polysubstance abuse ([place]) 04/02/2018  . Diabetic foot ulcer ([place]) 04/02/2018  . Cannabis use disorder, moderate, dependence ([place]) 05/19/2017  . Cocaine use disorder, moderate, dependence ([place]) 05/19/2017  . Schizoaffective disorder, bipolar type ([place]) 10/07/2015  . OSA (obstructive sleep apnea) 06/09/2015  . Vitamin D deficiency 01/06/2015  . Bilateral knee pain 01/05/2015  . Numbness and tingling in right hand 01/05/2015  . Insomnia 12/07/2014  . Right-sided lacunar infarction ([place]) 09/15/2014  . Left hemiparesis ([place]) 09/15/2014  . Dyspnea   . Back pain 09/11/2013  . Psychoactive substance-induced organic mood disorder ([place]) 05/28/2013  . Cocaine abuse with cocaine-induced mood disorder ([place]) 05/28/2013  . Cannabis dependence with cannabis-induced anxiety disorder ([place]) 05/28/2013  . Morbid obesity with BMI of 45.0-49.9, adult ([place]) 04/25/2012  . Chest pain 04/17/2012  . Hypertension 04/17/2012  . DM (diabetes mellitus) with peripheral vascular complication ([place]) [id number]  . Hyperlipidemia 04/17/2012  . Tobacco use 04/17/2012    Lina Sayre, DPT, NCS 06/25/2019, 3:30 PM  [facility] [address] [place], [place], [address] Phone: [phone]   Fax:  [phone]  Name: Diamond Rice MRN: [id number] Date of Birth: 16-May-1971

## 2019-06-26 ENCOUNTER — Emergency Department (HOSPITAL_COMMUNITY)
Admission: EM | Admit: 2019-06-26 | Discharge: 2019-06-26 | Disposition: A | Discharge status: Home / Self Care | Payer: Medicare Other | Attending: Emergency Medicine | Admitting: Emergency Medicine

## 2019-06-26 ENCOUNTER — Emergency Department (HOSPITAL_COMMUNITY): Payer: Medicare Other

## 2019-06-26 DIAGNOSIS — I69354 Hemiplegia and hemiparesis following cerebral infarction affecting left non-dominant side: Secondary | ICD-10-CM | POA: Diagnosis not present

## 2019-06-26 DIAGNOSIS — Z794 Long term (current) use of insulin: Secondary | ICD-10-CM | POA: Diagnosis not present

## 2019-06-26 DIAGNOSIS — R200 Anesthesia of skin: Secondary | ICD-10-CM | POA: Insufficient documentation

## 2019-06-26 DIAGNOSIS — I119 Hypertensive heart disease without heart failure: Secondary | ICD-10-CM | POA: Insufficient documentation

## 2019-06-26 DIAGNOSIS — E860 Dehydration: Secondary | ICD-10-CM | POA: Diagnosis not present

## 2019-06-26 DIAGNOSIS — E119 Type 2 diabetes mellitus without complications: Secondary | ICD-10-CM | POA: Diagnosis not present

## 2019-06-26 DIAGNOSIS — Z87891 Personal history of nicotine dependence: Secondary | ICD-10-CM | POA: Insufficient documentation

## 2019-06-26 DIAGNOSIS — J449 Chronic obstructive pulmonary disease, unspecified: Secondary | ICD-10-CM | POA: Diagnosis not present

## 2019-06-26 DIAGNOSIS — R531 Weakness: Secondary | ICD-10-CM

## 2019-06-26 DIAGNOSIS — Z79899 Other long term (current) drug therapy: Secondary | ICD-10-CM | POA: Insufficient documentation

## 2019-06-26 DIAGNOSIS — M6281 Muscle weakness (generalized): Secondary | ICD-10-CM | POA: Diagnosis not present

## 2019-06-26 DIAGNOSIS — R5383 Other fatigue: Secondary | ICD-10-CM | POA: Diagnosis present

## 2019-06-26 LAB — URINALYSIS, ROUTINE W REFLEX MICROSCOPIC
Bilirubin Urine: NEGATIVE
Glucose, UA: NEGATIVE mg/dL
Hgb urine dipstick: NEGATIVE
Ketones, ur: NEGATIVE mg/dL
Leukocytes,Ua: NEGATIVE
Nitrite: NEGATIVE
Protein, ur: 30 mg/dL — AB
Specific Gravity, Urine: 1.018 (ref 1.005–1.030)
pH: 5 (ref 5.0–8.0)

## 2019-06-26 LAB — CBC WITH DIFFERENTIAL/PLATELET
Abs Immature Granulocytes: 0.01 10*3/uL (ref 0.00–0.07)
Basophils Absolute: 0 10*3/uL (ref 0.0–0.1)
Basophils Relative: 0 %
Eosinophils Absolute: 0.1 10*3/uL (ref 0.0–0.5)
Eosinophils Relative: 1 %
HCT: 38.3 % (ref 36.0–46.0)
Hemoglobin: 11.9 g/dL — ABNORMAL LOW (ref 12.0–15.0)
Immature Granulocytes: 0 %
Lymphocytes Relative: 40 %
Lymphs Abs: 1.9 10*3/uL (ref 0.7–4.0)
MCH: 23.2 pg — ABNORMAL LOW (ref 26.0–34.0)
MCHC: 31.1 g/dL (ref 30.0–36.0)
MCV: 74.7 fL — ABNORMAL LOW (ref 80.0–100.0)
Monocytes Absolute: 0.4 10*3/uL (ref 0.1–1.0)
Monocytes Relative: 8 %
Neutro Abs: 2.4 10*3/uL (ref 1.7–7.7)
Neutrophils Relative %: 51 %
Platelets: 252 10*3/uL (ref 150–400)
RBC: 5.13 MIL/uL — ABNORMAL HIGH (ref 3.87–5.11)
RDW: 15.8 % — ABNORMAL HIGH (ref 11.5–15.5)
WBC: 4.8 10*3/uL (ref 4.0–10.5)
nRBC: 0 % (ref 0.0–0.2)

## 2019-06-26 LAB — COMPREHENSIVE METABOLIC PANEL
ALT: 11 U/L (ref 0–44)
AST: 14 U/L — ABNORMAL LOW (ref 15–41)
Albumin: 3.5 g/dL (ref 3.5–5.0)
Alkaline Phosphatase: 60 U/L (ref 38–126)
Anion gap: 9 (ref 5–15)
BUN: 18 mg/dL (ref 6–20)
CO2: 26 mmol/L (ref 22–32)
Calcium: 9.4 mg/dL (ref 8.9–10.3)
Chloride: 105 mmol/L (ref 98–111)
Creatinine, Ser: 1.31 mg/dL — ABNORMAL HIGH (ref 0.44–1.00)
GFR calc Af Amer: 56 mL/min — ABNORMAL LOW (ref >60)
GFR calc non Af Amer: 48 mL/min — ABNORMAL LOW (ref >60)
Glucose, Bld: 127 mg/dL — ABNORMAL HIGH (ref 70–99)
Potassium: 3.6 mmol/L (ref 3.5–5.1)
Sodium: 140 mmol/L (ref 135–145)
Total Bilirubin: 0.9 mg/dL (ref 0.3–1.2)
Total Protein: 7.5 g/dL (ref 6.5–8.1)

## 2019-06-26 LAB — CBG MONITORING, ED: Glucose-Capillary: 123 mg/dL — ABNORMAL HIGH (ref 70–99)

## 2019-06-26 NOTE — ED Triage Notes (Signed)
Pt arrives via ems from md office after MD was concerned pt was having another stroke due to fatigue. Pt states every time she takes her clonidine she feels that way. HR 50, BP 95/60. Hx cva with L sided deficits. Given 350cc NS en route.

## 2019-06-26 NOTE — ED Provider Notes (Signed)
[facility] EMERGENCY DEPARTMENT Provider Note   CSN: [id number] Arrival date & time: 06/26/19  1417     History   Chief Complaint Chief Complaint  Patient presents with  . Fatigue    HPI Diamond Rice is a 48 y.o. female.     48 y.o female with a PMH of Anxiety,DM, hemorraghic stroke 02/19/2019 with left sided weakness presents to the ED via EMS sent from PCP office. Patient reports she was fasting for her laboratory appointment today at her PCPs office, she reports once arriving there she felt like she could not keep her eyes open, she felt overall weak, stating she was unable to walk, her blood pressure was checked showing a 90 systolic over [id number] diastolic.  According to PCP there were concern for possible recurrent CVA.  She reports no increasing in weakness, changes in speech, headache.  Patient reports she is otherwise asymptomatic.  She denies any chest pain, shortness of breath, fevers.  The history is provided by the patient and medical records.    Past Medical History:  Diagnosis Date  . Anxiety   . Chronic lower back pain 04/17/2012   "just got over some; catched when I walked"  . COPD (chronic obstructive pulmonary disease) ([place])   . Critical lower limb ischemia 05/27/2018  . Depression   . Headache(784.0) 04/17/2012   "~ qod; lately waking up in am w/one"  . High cholesterol   . Hypertension   . Migraines 04/17/2012  . Obesity   . Sleep apnea    CPAP  . Stroke ([place])   . Type II diabetes mellitus ([place]) 08/28/2002    Patient Active Problem List   Diagnosis Date Noted  . Snoring 02/19/2019  . Hemorrhagic stroke ([place])   . Diastolic dysfunction   . Diabetes mellitus type 2 in obese ([place])   . Anemia of chronic disease   . Chronic obstructive pulmonary disease ([place])   . ICH (intracerebral hemorrhage) ([place]) 08/30/2018  . Hypertensive heart disease without heart failure 07/04/2018  . Nonrheumatic aortic valve insufficiency 07/04/2018  .  Esophageal abnormality   . Class 2 severe obesity due to excess calories with serious comorbidity and body mass index (BMI) of 35.0 to 35.9 in adult [facility])   . Peripheral vascular disease ([place])   . History of osteomyelitis   . History of amputation of lesser toe ([place])   . Pulmonary embolism ([place]) 06/22/2018  . Microcytic anemia 06/17/2018  . At risk for adverse drug reaction 06/11/2018  . Critical lower limb ischemia 05/27/2018  . GI bleeding 04/02/2018  . Acute renal failure (ARF) ([place]) 04/02/2018  . Hypokalemia 04/02/2018  . Polysubstance abuse ([place]) 04/02/2018  . Diabetic foot ulcer ([place]) 04/02/2018  . Cannabis use disorder, moderate, dependence ([place]) 05/19/2017  . Cocaine use disorder, moderate, dependence ([place]) 05/19/2017  . Schizoaffective disorder, bipolar type ([place]) 10/07/2015  . OSA (obstructive sleep apnea) 06/09/2015  . Vitamin D deficiency 01/06/2015  . Bilateral knee pain 01/05/2015  . Numbness and tingling in right hand 01/05/2015  . Insomnia 12/07/2014  . Right-sided lacunar infarction ([place]) 09/15/2014  . Left hemiparesis ([place]) 09/15/2014  . Dyspnea   . Back pain 09/11/2013  . Psychoactive substance-induced organic mood disorder ([place]) 05/28/2013  . Cocaine abuse with cocaine-induced mood disorder ([place]) 05/28/2013  . Cannabis dependence with cannabis-induced anxiety disorder ([place]) 05/28/2013  . Morbid obesity with BMI of 45.0-49.9, adult ([place]) 04/25/2012  . Chest pain 04/17/2012  . Hypertension 04/17/2012  . DM (  diabetes mellitus) with peripheral vascular complication ([place]) [id number]  . Hyperlipidemia 04/17/2012  . Tobacco use 04/17/2012    Past Surgical History:  Procedure Laterality Date  . ABDOMINAL AORTOGRAM W/LOWER EXTREMITY Left 05/27/2018   Procedure: ABDOMINAL AORTOGRAM W/LOWER EXTREMITY Runoff and Possible Intervention;  Surgeon: Waynetta Sandy, MD;  Location: [facility];  Service: Cardiovascular;  Laterality: Left;  . APPLICATION OF  WOUND VAC Left 06/06/2018   Procedure: APPLICATION OF WOUND VAC;  Surgeon: Waynetta Sandy, MD;  Location: [facility];  Service: Vascular;  Laterality: Left;  . BIOPSY  04/04/2018   Procedure: BIOPSY;  Surgeon: [name], MD;  Location: [facility] ENDOSCOPY;  Service: Endoscopy;;  . CARDIAC CATHETERIZATION  ~ 2011  . COLONOSCOPY N/A 04/04/2018   Procedure: COLONOSCOPY;  Surgeon: [name], MD;  [facility] ENDOSCOPY;  Service: Endoscopy;  Laterality: N/A;  . LOWER EXTREMITY ANGIOGRAM Left 05/27/2018  . PERIPHERAL VASCULAR INTERVENTION Left 05/27/2018   Procedure: PERIPHERAL VASCULAR INTERVENTION;  Surgeon: Waynetta Sandy, MD;  Location: [facility];  Service: Cardiovascular;  Laterality: Left;  . POLYPECTOMY  04/04/2018   Procedure: POLYPECTOMY;  Surgeon: [name], MD;  Location: [facility] ENDOSCOPY;  Service: Endoscopy;;  . TRANSMETATARSAL AMPUTATION Left 05/28/2018   Procedure: AMPUTATION TOES THREE, FOUR AND FIVE on left foot;  Surgeon: Waynetta Sandy, MD;  Location: [facility];  Service: Vascular;  Laterality: Left;  . WOUND DEBRIDEMENT Left 06/06/2018   Procedure: DEBRIDEMENT WOUND LEFT FOOT;  Surgeon: Waynetta Sandy, MD;  Location: [facility];  Service: Vascular;  Laterality: Left;     OB History   No obstetric history on file.      Home Medications    Prior to Admission medications   Medication Sig Start Date End Date Taking? Authorizing Provider  ARIPiprazole (ABILIFY) 15 MG tablet Take 1 tablet (15 mg total) by mouth at bedtime. 08/26/18   Medina-Vargas, Monina C, NP  aspirin EC 81 MG EC tablet Take 1 tablet (81 mg total) by mouth daily. 09/21/18   Donzetta Starch, NP  cloNIDine (CATAPRES) 0.3 MG tablet Take 1 tablet (0.3 mg total) by mouth 3 (three) times daily. 08/26/18   Medina-Vargas, Monina C, NP  feeding supplement, GLUCERNA SHAKE, (GLUCERNA SHAKE) LIQD Take 237 mLs by mouth 3 (three) times daily between meals. 09/20/18   Donzetta Starch, NP   ferrous sulfate 325 (65 FE) MG tablet Take 1 tablet (325 mg total) by mouth daily with breakfast. 08/26/18   Medina-Vargas, Monina C, NP  furosemide (LASIX) 40 MG tablet Take 1 tablet (40 mg total) by mouth daily. 08/26/18   Medina-Vargas, Monina C, NP  gabapentin (NEURONTIN) 100 MG capsule Take 1 capsule (100 mg total) by mouth 2 (two) times daily. 09/20/18   Donzetta Starch, NP  hydrALAZINE (APRESOLINE) 50 MG tablet Take 1 tablet (50 mg total) by mouth 4 (four) times daily. 08/26/18   Medina-Vargas, Monina C, NP  Insulin Detemir (LEVEMIR) 100 UNIT/ML Pen Inject 15 Units into the skin 2 (two) times daily. Patient taking differently: Inject 15 Units into the skin See admin instructions. Inject 15 units into the skin two times a day- morning and bedtime 08/26/18   Medina-Vargas, Monina C, NP  lip balm (CARMEX) ointment Apply topically as needed for lip care. Patient not taking: Reported on 06/02/2019 09/20/18   Donzetta Starch, NP  lisinopril (PRINIVIL,ZESTRIL) 30 MG tablet Take 1 tablet (30 mg total) by mouth daily. 08/26/18   Medina-Vargas, Senaida Lange, NP  Melatonin 3 MG TABS Take 3 mg by mouth at bedtime.     [provider]  Menthol-Methyl Salicylate (MUSCLE RUB) 10-15 % CREA Apply 1 application topically every 6 (six) hours as needed for muscle pain. Patient not taking: Reported on 06/02/2019 09/20/18   Donzetta Starch, NP  metoprolol tartrate (LOPRESSOR) 25 MG tablet Take 1 tablet (25 mg total) by mouth 2 (two) times daily. 08/26/18   Medina-Vargas, Monina C, NP  ondansetron (ZOFRAN) 4 MG tablet Take 1 tablet (4 mg total) by mouth every 6 (six) hours. 02/11/19   Kendrick, Caitlyn S, PA-C  pentoxifylline (TRENTAL) 400 MG CR tablet Take 1 tablet (400 mg total) by mouth 3 (three) times daily with meals. 08/26/18   Medina-Vargas, Monina C, NP  polyethylene glycol (MIRALAX / GLYCOLAX) packet Take 17 g by mouth daily as needed for mild constipation. Patient not taking: Reported on 06/02/2019 09/20/18    Donzetta Starch, NP  potassium chloride SA (K-DUR,KLOR-CON) 20 MEQ tablet Take 1 tablet (20 mEq total) by mouth daily. 08/26/18   Medina-Vargas, Monina C, NP  senna-docusate (SENOKOT-S) 8.6-50 MG tablet Take 1 tablet by mouth at bedtime. Patient not taking: Reported on 06/02/2019 09/20/18   Donzetta Starch, NP  silver sulfADIAZINE (SILVADENE) 1 % cream Apply topically daily. Patient not taking: Reported on 06/02/2019 09/21/18   Donzetta Starch, NP    Family History Family History  Problem Relation Age of Onset  . Stroke Brother 88  . Stroke Brother 57  . Heart attack Mother 6  . Stroke Father 62    Social History Social History   Tobacco Use  . Smoking status: Former Smoker    Packs/day: 0.25    Years: 28.00    Pack years: 7.00    Types: Cigarettes  . Smokeless tobacco: Never Used  Substance Use Topics  . Alcohol use: No    Alcohol/week: 0.0 standard drinks    Comment: rare  . Drug use: Yes    Types: Marijuana, "Crack" cocaine    Comment: marijuana last use days ago; last cocaine several months ago     Allergies   Morphine and related and Metformin   Review of Systems Review of Systems  Constitutional: Negative for fever.  HENT: Negative for rhinorrhea and sore throat.   Respiratory: Negative for shortness of breath.   Cardiovascular: Negative for chest pain.  Gastrointestinal: Negative for abdominal pain, diarrhea, nausea and vomiting.  Genitourinary: Negative for flank pain.  Musculoskeletal: Negative for back pain.  Skin: Negative for pallor and wound.  Neurological: Positive for facial asymmetry (baseline) and weakness. Negative for seizures, syncope, speech difficulty, light-headedness, numbness and headaches.     Physical Exam Updated Vital Signs BP 124/76   Pulse 63   Temp 98 F (36.7 C) (Oral)   Resp (!) 21   SpO2 100%   Physical Exam Vitals signs and nursing note reviewed.  Constitutional:      Appearance: Normal appearance. She is ill-appearing  (chronically ).  HENT:     Head: Normocephalic and atraumatic.     Nose: Nose normal.     Mouth/Throat:     Mouth: Mucous membranes are moist.  Eyes:     General: No visual field deficit.    Pupils: Pupils are equal, round, and reactive to light.  Neck:     Musculoskeletal: Normal range of motion and neck supple.  Cardiovascular:     Rate and Rhythm: Normal rate.  Pulmonary:  Effort: Pulmonary effort is normal.     Breath sounds: No wheezing, rhonchi or rales.  Abdominal:     General: Abdomen is flat. There is no distension.     Palpations: Abdomen is soft.     Tenderness: There is no abdominal tenderness. There is no right CVA tenderness, left CVA tenderness or guarding.  Skin:    General: Skin is warm and dry.  Neurological:     Mental Status: She is alert.     Cranial Nerves: Facial asymmetry present. No dysarthria.     Motor: Atrophy (left arm) present. No seizure activity.     Comments:  Alert, oriented, thought content appropriate. Speech fluent without evidence of aphasia.   Able to follow 2 step commands without difficulty.   Cranial Nerves:  II:  Peripheral visual fields grossly normal, pupils, round, reactive to light III,IV, VI: ptosis not present, extra-ocular motions intact bilaterally  V,VII: smile asymmetric, facial light touch diminished to left face VIII: hearing grossly normal bilaterally  IX,X: midline uvula rise  XI: bilateral shoulder shrug equal and strong XII: midline tongue extension  Motor:  4/5 in upper and lower extremities bilaterally including strong and equal grip strength and dorsiflexion/plantar flexion (diminished on left upper extremities)  Sensory: light touch normal in all extremities.  Cerebellar: normal finger-to-nose with bilateral upper extremities, pronator drift negative Gait: Not assessed        ED Treatments / Results  Labs (all labs ordered are listed, but only abnormal results are displayed) Labs Reviewed  CBC WITH  DIFFERENTIAL/PLATELET - Abnormal; Notable for the following components:      Result Value   RBC 5.13 (*)    Hemoglobin 11.9 (*)    MCV 74.7 (*)    MCH 23.2 (*)    RDW 15.8 (*)    All other components within normal limits  COMPREHENSIVE METABOLIC PANEL - Abnormal; Notable for the following components:   Glucose, Bld 127 (*)    Creatinine, Ser 1.31 (*)    AST 14 (*)    GFR calc non Af Amer 48 (*)    GFR calc Af Amer 56 (*)    All other components within normal limits  URINALYSIS, ROUTINE W REFLEX MICROSCOPIC - Abnormal; Notable for the following components:   Protein, ur 30 (*)    Bacteria, UA RARE (*)    All other components within normal limits  CBG MONITORING, ED - Abnormal; Notable for the following components:   Glucose-Capillary 123 (*)    All other components within normal limits    EKG None  Radiology Ct Head Wo Contrast  Result Date: 06/26/2019 CLINICAL DATA:  Generalized muscle weakness. EXAM: CT HEAD WITHOUT CONTRAST TECHNIQUE: Contiguous axial images were obtained from the base of the skull through the vertex without intravenous contrast. COMPARISON:  08/30/2018.  Brain MR dated 09/02/2018. FINDINGS: Brain: Mild-to-moderate patchy white matter low density in both cerebral hemispheres with mild progression. Normal size and position of the ventricles. No intracranial hemorrhage, mass lesion or CT evidence of acute infarction. Vascular: No hyperdense vessel or unexpected calcification. Skull: Normal. Negative for fracture or focal lesion. Sinuses/Orbits: Unremarkable. Other: None. IMPRESSION: 1. No acute abnormality. 2. Mild-to-moderate chronic small vessel white matter ischemic changes in both cerebral hemispheres with mild progression. Electronically Signed   By: Claudie Revering M.D.   On: 06/26/2019 17:01    Procedures Procedures (including critical care time)  Medications Ordered in ED Medications - No data to display   Initial Impression /  Assessment and Plan / ED  Course  I have reviewed the triage vital signs and the nursing notes.  Pertinent labs & imaging results that were available during my care of the patient were reviewed by me and considered in my medical decision making (see chart for details).       Patient with a past medical history of CVA, COPD, hypertension, hyperlipidemia, diabetes presents to the ED brought in via EMS from PCPs office.  Patient reports she was fasting for her laboratory screening, when she arrived to the PCPs office she felt overall weak, felt like she could not keep her eyes open and she felt very fatigued.  Blood pressure was recorded at PCPs office to be 90 over 50s, 400 ml of fluids was given via EMS.  Patient reports she feels overall weak, does have some shortness of breath but this is not out of the ordinary for patient, she has a prior history of COPD.  During my evaluation patient appears chronically ill.  She does report some left facial numbness, this is consistent with patient's baseline.  She has slightly diminished grip on the left upper extremity, this is a deficit from her prior CVA.  Patient is able to fully extend and flex bilateral lower extremities.  Blood pressure was rechecked by me, systolics around [id number] over 76.  Patient reports she has not eaten anything since the day before due to laboratory screening.  Will check CBG as patient is a diabetic, also obtain CT she has a prior history of CVA.  CBG checked on arrival at 123.  She has been n.p.o. since last night due to fasting for her labs.  CT head showed: 1. No acute abnormality.  2. Mild-to-moderate chronic small vessel white matter ischemic  changes in both cerebral hemispheres with mild progression.     CBC with no leukocytosis, hemoglobin slightly decreased.  CMP without any electrolyte arrangement.  Creatinine level slightly elevated but consistent with her previous visits.  UA pending.  Suspect that likely weakness was due to n.p.o. status for  lab. UA showed rare bacteria, no nitrates, leukocytes.  Patient denies any urinary symptoms at this time.  She is eating appropriately, without any weaknesses.  Will have patient follow-up with PCP.  Patient understands and agrees with management, patient stable for discharge.   Portions of this note were generated with Lobbyist. Dictation errors may occur despite best attempts at proofreading.  Final Clinical Impressions(s) / ED Diagnoses   Final diagnoses:  Weakness  Dehydration    ED Discharge Orders    None       Janeece Fitting, PA-C 06/26/19 2102    Varney Biles, MD 06/26/19 2252

## 2019-06-26 NOTE — Discharge Instructions (Signed)
Your laboratory results were within normal limits.  Your Head CT was normal.  Please follow up with you primary care physician as needed.

## 2019-06-27 ENCOUNTER — Ambulatory Visit: Payer: Medicare Other | Admitting: Physical Therapy

## 2019-06-30 ENCOUNTER — Other Ambulatory Visit: Payer: Self-pay | Admitting: Internal Medicine

## 2019-06-30 DIAGNOSIS — Z1231 Encounter for screening mammogram for malignant neoplasm of breast: Secondary | ICD-10-CM

## 2019-07-02 ENCOUNTER — Encounter: Payer: Self-pay | Admitting: Physical Therapy

## 2019-07-02 ENCOUNTER — Ambulatory Visit: Payer: Medicare Other | Attending: Internal Medicine | Admitting: Physical Therapy

## 2019-07-02 ENCOUNTER — Other Ambulatory Visit: Payer: Self-pay

## 2019-07-02 VITALS — BP 123/79 | HR 69

## 2019-07-02 DIAGNOSIS — M6281 Muscle weakness (generalized): Secondary | ICD-10-CM | POA: Insufficient documentation

## 2019-07-02 DIAGNOSIS — R262 Difficulty in walking, not elsewhere classified: Secondary | ICD-10-CM | POA: Insufficient documentation

## 2019-07-02 DIAGNOSIS — R29818 Other symptoms and signs involving the nervous system: Secondary | ICD-10-CM | POA: Diagnosis present

## 2019-07-02 DIAGNOSIS — R269 Unspecified abnormalities of gait and mobility: Secondary | ICD-10-CM | POA: Insufficient documentation

## 2019-07-03 NOTE — Therapy (Signed)
[facility] [address] [place], [place], [address] Phone: [phone]   Fax:  [phone]  Physical Therapy Treatment  Patient Details  Name: Diamond Rice MRN: [id number] Date of Birth: 01/12/1971 Referring Provider (PT): Johney Maine, MD   Encounter Date: 07/02/2019  PT End of Session - 07/02/19 1407    Visit Number  6    Number of Visits  13    Date for PT Re-Evaluation  08/31/19   written for 60 day POC   Authorization Type  Medicare & Medicaid    PT Start Time  1402    PT Stop Time  1430    PT Time Calculation (min)  28 min    Equipment Utilized During Treatment  Other (comment)   pt had right knee brace and right wrist brace   Activity Tolerance  Patient limited by pain    Behavior During Therapy  [facility] for tasks assessed/performed       Past Medical History:  Diagnosis Date  . Anxiety   . Chronic lower back pain 04/17/2012   "just got over some; catched when I walked"  . COPD (chronic obstructive pulmonary disease) ([place])   . Critical lower limb ischemia 05/27/2018  . Depression   . Headache(784.0) 04/17/2012   "~ qod; lately waking up in am w/one"  . High cholesterol   . Hypertension   . Migraines 04/17/2012  . Obesity   . Sleep apnea    CPAP  . Stroke ([place])   . Type II diabetes mellitus ([place]) 08/28/2002    Past Surgical History:  Procedure Laterality Date  . ABDOMINAL AORTOGRAM W/LOWER EXTREMITY Left 05/27/2018   Procedure: ABDOMINAL AORTOGRAM W/LOWER EXTREMITY Runoff and Possible Intervention;  Surgeon: Waynetta Sandy, MD;  Location: [place];  Service: Cardiovascular;  Laterality: Left;  . APPLICATION OF WOUND VAC Left 06/06/2018   Procedure: APPLICATION OF WOUND VAC;  Surgeon: Waynetta Sandy, MD;  Location: [facility];  Service: Vascular;  Laterality: Left;  . BIOPSY  04/04/2018   Procedure: BIOPSY;  Surgeon: [name], MD;  Location: [facility] ENDOSCOPY;  Service:  Endoscopy;;  . CARDIAC CATHETERIZATION  ~ 2011  . COLONOSCOPY N/A 04/04/2018   Procedure: COLONOSCOPY;  Surgeon: [name], MD;  Location: [facility] ENDOSCOPY;  Service: Endoscopy;  Laterality: N/A;  . LOWER EXTREMITY ANGIOGRAM Left 05/27/2018  . PERIPHERAL VASCULAR INTERVENTION Left 05/27/2018   Procedure: PERIPHERAL VASCULAR INTERVENTION;  Surgeon: Waynetta Sandy, MD;  Location: [place];  Service: Cardiovascular;  Laterality: Left;  . POLYPECTOMY  04/04/2018   Procedure: POLYPECTOMY;  Surgeon: [name], MD;  Location[facility] ENDOSCOPY;  Service: Endoscopy;;  . TRANSMETATARSAL AMPUTATION Left 05/28/2018   Procedure: AMPUTATION TOES THREE, FOUR AND FIVE on left foot;  Surgeon: Waynetta Sandy, MD;  Location: [facility];  Service: Vascular;  Laterality: Left;  . WOUND DEBRIDEMENT Left 06/06/2018   Procedure: DEBRIDEMENT WOUND LEFT FOOT;  Surgeon: Waynetta Sandy, MD;  Location: [facility];  Service: Vascular;  Laterality: Left;    Vitals:   07/02/19 1411  BP: 123/79  Pulse: 69    Subjective Assessment - 07/02/19 1405    Subjective  Presented to PTA in wheelchair in lobby, leaning toward left side with walker in front of her. Had 3rd injection in the right knee today, sore today. Has her right knee brace on and her wrist brace with her. No falls.    Pertinent History  PMH: rt CVA (2016,  residual left sided weakness), lt transmet amputation 05/28/18, Sleep apnea, COPD, smoker, DM, Bipolar, HTN, anxiety, depression.    Patient Stated Goals  To strengthen left side    Currently in Pain?  Yes    Pain Location  Knee   and elbow   Pain Orientation  Right    Pain Descriptors / Indicators  Aching    Pain Type  Acute pain;Chronic pain    Pain Onset  More than a month ago    Aggravating Factors   "everything"    Pain Relieving Factors  "nothing"           [facility] Adult PT Treatment/Exercise - 07/02/19 1414      Transfers   Transfers  Sit to Stand;Stand to Sit       Ambulation/Gait   Ambulation/Gait  No    Ambulation/Gait Assistance Details  not safe to ambulate pt today due to fatigue/falling asleep in session      Self-Care   Self-Care  Other Self-Care Comments    Other Self-Care Comments   pt falling asleep in session, frequent cues to attend to conversation with PTA. Pt able to respond to all questions, however when not in conversation would close her eyes/nodding off; states her BP meds make her so tired. Asked about her blood sugars. Pt reports she only checks in 2-3 times a week as she is on scheduled med. Does report she has been at appts all day (since 9am when transportation picked her up) and has only ate fries so far since then. Pt provided with peanut butter crackers and soda at beginning of session.  Pt with improved alertness, still with complaints of being tired. Session held today due to medical issues.            PT Short Term Goals - 07/02/19 1412      PT SHORT TERM GOAL #1   Title  Patient will be independent with initial HEP in order to build upon functional gains in therapy. ALL STGS DUE 07/06/19    Baseline  07/02/19: met with current program per pt report. states it is getting easy    Status  Achieved      PT SHORT TERM GOAL #2   Title  Patient will undergo assessment of BERG and TUG to address fall risk - LTG written as appropriate.    Baseline  Berg score 32/56 and TUG 80 sec completed on 06/10/19.    Status  Achieved      PT SHORT TERM GOAL #3   Title  Patient will report worst R knee pain to a 4/10 or less in order to improve tolerance for ambulation at home.    Baseline  07/02/19: reporing as a 7/10 with this session, of note pt nodding off to sleep in wheelchair.    Status  Partially Met      PT SHORT TERM GOAL #4   Title  Patient will ambulate at least 40' with quad base cane and supervision in order to improve functional ambulation at home.    Baseline  limited to 30' at eval with R knee immobilizer due to  increased R knee pain    Time  4    Period  Weeks    Status  New        PT Long Term Goals - 06/10/19 1435      PT LONG TERM GOAL #1   Title  Patient will be independent with final HEP in order to build  upon functional gains in therapy. ALL LTGS DUE 08/05/19    Time  8    Period  Weeks    Status  New      PT LONG TERM GOAL #2   Title  Patient will improve baseline BERG score by at least 6 points in order to demo decreased fall risk.    Baseline  32/56 on 06/10/19    Time  8    Period  Weeks    Status  New      PT LONG TERM GOAL #3   Title  Pt will decrease TUG from 80 sec to <60 sec for improved balance and decreased fall risk.    Baseline  80 sec on 06/10/19    Time  8    Period  Weeks    Status  New      PT LONG TERM GOAL #4   Title  Patient will ambulate at least 150' indoors and outdoors with quad base cane and supervision  in order to improve tolerance for walking in and out of doctors appointments    Time  8    Period  Weeks    Status  New      PT LONG TERM GOAL #5   Title  Patient will decrease 5x sit <> stand time to at least 20 seconds with single UE assist from low mat table in order to demo improved LE strength.    Baseline  41.19 seconds from high mat table, RUE pushing from quad cane.    Time  8    Period  Weeks    Status  New            Plan - 07/02/19 1408    Clinical Impression Statement  Today's skilled session was limited by pt fatigue/lethargy that the pt stated was medication related. Her BP was WNL's. She was provided a snack and soda in case it was low blood sugar related. The pt was advised to check her blood sugar as soon as she got home and to call MD/911 if lethargy did not improve. Able to check off some of pt's STGs while pt ate her snack, will need to check remaining goals at next session.  Primary PT's in her care notified of events of today's session.    Personal Factors and Comorbidities  Comorbidity 3+;Past/Current Experience;Time  since onset of injury/illness/exacerbation    Comorbidities  rt CVA (2016, residual left sided weakness), lt transmet amputation 05/28/18, Sleep apnea, COPD, smoker, DM, Bipolar, HTN, anxiety, depression    Examination-Activity Limitations  Stand;Locomotion Level;Transfers    Examination-Participation Restrictions  Community Activity;Meal Prep;Cleaning    Stability/Clinical Decision Making  Evolving/Moderate complexity    Rehab Potential  Fair    PT Frequency  2x / week    PT Duration  4 weeks   followed by 1x week for 4 weeks   PT Treatment/Interventions  ADLs/Self Care Home Management;Moist Heat;Gait training;DME Instruction;Stair training;Therapeutic activities;Functional mobility training;Therapeutic exercise;Balance training;Neuromuscular re-education;Patient/family education;Passive range of motion    PT Next Visit Plan  monior BP. STGs due (past due as unable to assess them on 07/02/19).    Consulted and Agree with Plan of Care  Patient       Patient will benefit from skilled therapeutic intervention in order to improve the following deficits and impairments:  Abnormal gait, Decreased balance, Decreased activity tolerance, Decreased endurance, Decreased mobility, Decreased range of motion, Decreased strength, Difficulty walking, Decreased coordination, Pain  Visit Diagnosis: Gait abnormality  Muscle weakness (generalized)  Difficulty in walking, not elsewhere classified     Problem List Patient Active Problem List   Diagnosis Date Noted  . Snoring 02/19/2019  . Hemorrhagic stroke ([place])   . Diastolic dysfunction   . Diabetes mellitus type 2 in obese ([place])   . Anemia of chronic disease   . Chronic obstructive pulmonary disease ([place])   . ICH (intracerebral hemorrhage) ([place]) 08/30/2018  . Hypertensive heart disease without heart failure 07/04/2018  . Nonrheumatic aortic valve insufficiency 07/04/2018  . Esophageal abnormality   . Class 2 severe obesity due to excess calories  with serious comorbidity and body mass index (BMI) of 35.0 to 35.9 in adult [facility])   . Peripheral vascular disease ([place])   . History of osteomyelitis   . History of amputation of lesser toe ([place])   . Pulmonary embolism ([place]) 06/22/2018  . Microcytic anemia 06/17/2018  . At risk for adverse drug reaction 06/11/2018  . Critical lower limb ischemia 05/27/2018  . GI bleeding 04/02/2018  . Acute renal failure (ARF) ([place]) 04/02/2018  . Hypokalemia 04/02/2018  . Polysubstance abuse ([place]) 04/02/2018  . Diabetic foot ulcer ([place]) 04/02/2018  . Cannabis use disorder, moderate, dependence ([place]) 05/19/2017  . Cocaine use disorder, moderate, dependence ([place]) 05/19/2017  . Schizoaffective disorder, bipolar type ([place]) 10/07/2015  . OSA (obstructive sleep apnea) 06/09/2015  . Vitamin D deficiency 01/06/2015  . Bilateral knee pain 01/05/2015  . Numbness and tingling in right hand 01/05/2015  . Insomnia 12/07/2014  . Right-sided lacunar infarction ([address]) 09/15/2014  . Left hemiparesis ([place]) 09/15/2014  . Dyspnea   . Back pain 09/11/2013  . Psychoactive substance-induced organic mood disorder ([place]) 05/28/2013  . Cocaine abuse with cocaine-induced mood disorder ([place]) 05/28/2013  . Cannabis dependence with cannabis-induced anxiety disorder ([place]) 05/28/2013  . Morbid obesity with BMI of 45.0-49.9, adult ([place]) 04/25/2012  . Chest pain 04/17/2012  . Hypertension 04/17/2012  . DM (diabetes mellitus) with peripheral vascular complication ([place]) 57/50/5183  . Hyperlipidemia 04/17/2012  . Tobacco use 04/17/2012    Willow Ora, PTA, [facility] [address] [place], [place] [address] [phone] 07/03/19, 12:54 PM   Name: Diamond Rice MRN: [id number] Date of Birth: Oct 06, 1970

## 2019-07-04 ENCOUNTER — Ambulatory Visit: Payer: Medicare Other | Admitting: Physical Therapy

## 2019-07-04 ENCOUNTER — Other Ambulatory Visit: Payer: Self-pay

## 2019-07-04 VITALS — BP 150/90 | HR 63

## 2019-07-04 DIAGNOSIS — R269 Unspecified abnormalities of gait and mobility: Secondary | ICD-10-CM | POA: Diagnosis not present

## 2019-07-04 DIAGNOSIS — R262 Difficulty in walking, not elsewhere classified: Secondary | ICD-10-CM

## 2019-07-04 DIAGNOSIS — M6281 Muscle weakness (generalized): Secondary | ICD-10-CM

## 2019-07-04 DIAGNOSIS — R29818 Other symptoms and signs involving the nervous system: Secondary | ICD-10-CM

## 2019-07-04 NOTE — Therapy (Signed)
[facility] [address] [place], [place], [address] Phone: [phone]   Fax:  [phone]  Physical Therapy Treatment  Patient Details  Name: Diamond Rice MRN: [id number] Date of Birth: 1970/12/31 Referring Provider (PT): Johney Maine, MD   Encounter Date: 07/04/2019  PT End of Session - 07/04/19 1605    Visit Number  7    Number of Visits  13    Date for PT Re-Evaluation  08/31/19   written for 60 day POC   Authorization Type  Medicare & Medicaid    PT Start Time  1416   pt arrived late to appointment   PT Stop Time 1447   PT Time Calculation (min)  31 min    Equipment Utilized During Treatment  Gait belt   pt had right knee brace and right wrist brace   Activity Tolerance  Patient limited by pain;Patient tolerated treatment well    Behavior During Therapy  [facility] for tasks assessed/performed       Past Medical History:  Diagnosis Date  . Anxiety   . Chronic lower back pain 04/17/2012   "just got over some; catched when I walked"  . COPD (chronic obstructive pulmonary disease) ([place])   . Critical lower limb ischemia 05/27/2018  . Depression   . Headache(784.0) 04/17/2012   "~ qod; lately waking up in am w/one"  . High cholesterol   . Hypertension   . Migraines 04/17/2012  . Obesity   . Sleep apnea    CPAP  . Stroke ([place])   . Type II diabetes mellitus ([place]) 08/28/2002    Past Surgical History:  Procedure Laterality Date  . ABDOMINAL AORTOGRAM W/LOWER EXTREMITY Left 05/27/2018   Procedure: ABDOMINAL AORTOGRAM W/LOWER EXTREMITY Runoff and Possible Intervention;  Surgeon: Waynetta Sandy, MD;  Location: [place] [facility];  Service: Cardiovascular;  Laterality: Left;  . APPLICATION OF WOUND VAC Left 06/06/2018   Procedure: APPLICATION OF WOUND VAC;  Surgeon: Waynetta Sandy, MD;  Location: [place];  Service: Vascular;  Laterality: Left;  . BIOPSY  04/04/2018   Procedure: BIOPSY;   Surgeon: [name], MD;  Location: [facility] ENDOSCOPY;  Service: Endoscopy;;  . CARDIAC CATHETERIZATION  ~ 2011  . COLONOSCOPY N/A 04/04/2018   Procedure: COLONOSCOPY;  Surgeon: [name], MD;  Location: [facility] ENDOSCOPY;  Service: Endoscopy;  Laterality: N/A;  . LOWER EXTREMITY ANGIOGRAM Left 05/27/2018  . PERIPHERAL VASCULAR INTERVENTION Left 05/27/2018   Procedure: PERIPHERAL VASCULAR INTERVENTION;  Surgeon: Waynetta Sandy, MD;  Location: [place] [facility];  Service: Cardiovascular;  Laterality: Left;  . POLYPECTOMY  04/04/2018   Procedure: POLYPECTOMY;  Surgeon: [name], MD;  Location: [facility] ENDOSCOPY;  Service: Endoscopy;;  . TRANSMETATARSAL AMPUTATION Left 05/28/2018   Procedure: AMPUTATION TOES THREE, FOUR AND FIVE on left foot;  Surgeon: Waynetta Sandy, MD;  Location: [place];  Service: Vascular;  Laterality: Left;  . WOUND DEBRIDEMENT Left 06/06/2018   Procedure: DEBRIDEMENT WOUND LEFT FOOT;  Surgeon: Waynetta Sandy, MD;  Location: [place];  Service: Vascular;  Laterality: Left;    Vitals:   07/04/19 1424 07/04/19 1432  BP: (!) 147/102 (!) 150/90  Pulse: 63     Subjective Assessment - 07/04/19 1426    Subjective  Just saw her PCP today. States that the injections in her R knee are helping. Had a shot for pain in her left elbow today and states it is not helping. Ambulated with quad cane back into clinic  and states that she feels light headed and has not eaten anything since yesterday. No falls.    Pertinent History  PMH: rt CVA (2016, residual left sided weakness), lt transmet amputation 05/28/18, Sleep apnea, COPD, smoker, DM, Bipolar, HTN, anxiety, depression.    Patient Stated Goals  To strengthen left side    Currently in Pain?  Yes    Pain Score  10-Worst pain ever    Pain Location  Elbow    Pain Orientation  Right    Pain Descriptors / Indicators  Aching;Stabbing    Pain Onset  More than a month ago    Aggravating Factors   "everything"    Pain  Relieving Factors  "nothing"                       [facility] Adult PT Treatment/Exercise - 07/04/19 0001      Transfers   Transfers  Sit to Stand;Stand to Sit    Comments  2 x 5 reps sit <> stands from low blue mat table on blue foam with no UE assist       Ambulation/Gait   Ambulation/Gait  Yes    Ambulation/Gait Assistance  5: Supervision    Ambulation/Gait Assistance Details  Pt ambulated in and out of clinic with large base quad cane today with supevision, pt with R knee brace donned.     Ambulation Distance (Feet)  115 Feet   approx. in and out of session   Assistive device  Large base quad cane    Gait Pattern  Step-through pattern;Left genu recurvatum;Decreased stance time - left      High Level Balance   High Level Balance Comments  1 x 10 reps alternating step taps to 4" steps with intermittent UE support, progressing to 3 reps x 10 seconds B of SLS holds on 4" step with no UE support       Therapeutic Activites    Therapeutic Activities  Other Therapeutic Activities    Other Therapeutic Activities  Pt requesting peanut butter crackers at start of session due to feeling lightheaded and not eating anything since yesterday, even though patient is alert and oriented and was able to ambulate into clinic and treatment gym. Pt reporting feeling much better after eating 3 crackers. Pt further educated on importance of eating, especially before therapy sessions and due to pt's diabetes and blood sugar.           Balance Exercises - 07/04/19 1441      Balance Exercises: Standing   Standing Eyes Opened  Foam/compliant surface;Narrow base of support (BOS);2 reps;30 secs    Standing Eyes Closed  Wide (BOA);Foam/compliant surface;3 reps;30 secs    Other Standing Exercises  Standing on blue foam 1 x 10 reps marching, 1 x 10 reps shifting weight onto toes and then shifting weight onto heels.          PT Short Term Goals - 07/02/19 1412      PT SHORT TERM GOAL #1    Title  Patient will be independent with initial HEP in order to build upon functional gains in therapy. ALL STGS DUE 07/06/19    Baseline  07/02/19: met with current program per pt report. states it is getting easy    Status  Achieved      PT SHORT TERM GOAL #2   Title  Patient will undergo assessment of BERG and TUG to address fall risk - LTG written as appropriate.  Baseline  Berg score 32/56 and TUG 80 sec completed on 06/10/19.    Status  Achieved      PT SHORT TERM GOAL #3   Title  Patient will report worst R knee pain to a 4/10 or less in order to improve tolerance for ambulation at home.    Baseline  07/02/19: reporing as a 7/10 with this session, of note pt nodding off to sleep in wheelchair.    Status  Partially Met      PT SHORT TERM GOAL #4   Title  Patient will ambulate at least 2' with quad base cane and supervision in order to improve functional ambulation at home.    Baseline  limited to 30' at eval with R knee immobilizer due to increased R knee pain    Time  4    Period  Weeks    Status  New        PT Long Term Goals - 06/10/19 1435      PT LONG TERM GOAL #1   Title  Patient will be independent with final HEP in order to build upon functional gains in therapy. ALL LTGS DUE 08/05/19    Time  8    Period  Weeks    Status  New      PT LONG TERM GOAL #2   Title  Patient will improve baseline BERG score by at least 6 points in order to demo decreased fall risk.    Baseline  32/56 on 06/10/19    Time  8    Period  Weeks    Status  New      PT LONG TERM GOAL #3   Title  Pt will decrease TUG from 80 sec to <60 sec for improved balance and decreased fall risk.    Baseline  80 sec on 06/10/19    Time  8    Period  Weeks    Status  New      PT LONG TERM GOAL #4   Title  Patient will ambulate at least 150' indoors and outdoors with quad base cane and supervision  in order to improve tolerance for walking in and out of doctors appointments    Time  8    Period   Weeks    Status  New      PT LONG TERM GOAL #5   Title  Patient will decrease 5x sit <> stand time to at least 20 seconds with single UE assist from low mat table in order to demo improved LE strength.    Baseline  41.19 seconds from high mat table, RUE pushing from quad cane.    Time  8    Period  Weeks    Status  New            Plan - 07/04/19 1616    Clinical Impression Statement  Treatment was limited today due to pt arriving late for session. Pt arrived with high BP, after sitting at rest, decreased to [facility] to participate in therapy.  At beginning of session pt reported feeling lightheaded and needed to eat crackers due to not eating since yesterday, however pt was alert and able to ambulate in and out of clinic with no problem. Remainder of session focused on balance on compliant surfaces and SLS. Pt needed rest breaks due to fatigue. Will continue to progress towards LTGs.    Personal Factors and Comorbidities  Comorbidity 3+;Past/Current Experience;Time since onset of injury/illness/exacerbation  Comorbidities  rt CVA (2016, residual left sided weakness), lt transmet amputation 05/28/18, Sleep apnea, COPD, smoker, DM, Bipolar, HTN, anxiety, depression    Examination-Activity Limitations  Stand;Locomotion Level;Transfers    Examination-Participation Restrictions  Community Activity;Meal Prep;Cleaning    Stability/Clinical Decision Making  Evolving/Moderate complexity    Rehab Potential  Fair    PT Frequency  2x / week    PT Duration  4 weeks   followed by 1x week for 4 weeks   PT Treatment/Interventions  ADLs/Self Care Home Management;Moist Heat;Gait training;DME Instruction;Stair training;Therapeutic activities;Functional mobility training;Therapeutic exercise;Balance training;Neuromuscular re-education;Patient/family education;Passive range of motion    PT Next Visit Plan  monitor BP. look at HEP and upgrade as appropriate. standing balance, gait training.    Consulted and  Agree with Plan of Care  Patient       Patient will benefit from skilled therapeutic intervention in order to improve the following deficits and impairments:  Abnormal gait, Decreased balance, Decreased activity tolerance, Decreased endurance, Decreased mobility, Decreased range of motion, Decreased strength, Difficulty walking, Decreased coordination, Pain  Visit Diagnosis: Gait abnormality  Muscle weakness (generalized)  Difficulty in walking, not elsewhere classified  Other symptoms and signs involving the nervous system     Problem List Patient Active Problem List   Diagnosis Date Noted  . Snoring 02/19/2019  . Hemorrhagic stroke ([place])   . Diastolic dysfunction   . Diabetes mellitus type 2 in obese ([place])   . Anemia of chronic disease   . Chronic obstructive pulmonary disease ([place])   . ICH (intracerebral hemorrhage) ([place]) 08/30/2018  . Hypertensive heart disease without heart failure 07/04/2018  . Nonrheumatic aortic valve insufficiency 07/04/2018  . Esophageal abnormality   . Class 2 severe obesity due to excess calories with serious comorbidity and body mass index (BMI) of 35.0 to 35.9 in adult [facility])   . Peripheral vascular disease ([place])   . History of osteomyelitis   . History of amputation of lesser toe ([place])   . Pulmonary embolism ([place]) 06/22/2018  . Microcytic anemia 06/17/2018  . At risk for adverse drug reaction 06/11/2018  . Critical lower limb ischemia 05/27/2018  . GI bleeding 04/02/2018  . Acute renal failure (ARF) ([place]) 04/02/2018  . Hypokalemia 04/02/2018  . Polysubstance abuse ([place]) 04/02/2018  . Diabetic foot ulcer ([place]) 04/02/2018  . Cannabis use disorder, moderate, dependence ([place]) 05/19/2017  . Cocaine use disorder, moderate, dependence ([place]) 05/19/2017  . Schizoaffective disorder, bipolar type ([place]) 10/07/2015  . OSA (obstructive sleep apnea) 06/09/2015  . Vitamin D deficiency 01/06/2015  . Bilateral knee pain 01/05/2015  . Numbness and  tingling in right hand 01/05/2015  . Insomnia 12/07/2014  . Right-sided lacunar infarction ([place]) 09/15/2014  . Left hemiparesis ([place]) 09/15/2014  . Dyspnea   . Back pain 09/11/2013  . Psychoactive substance-induced organic mood disorder ([place]) 05/28/2013  . Cocaine abuse with cocaine-induced mood disorder ([place]) 05/28/2013  . Cannabis dependence with cannabis-induced anxiety disorder ([place]) 05/28/2013  . Morbid obesity with BMI of 45.0-49.9, adult ([place]) 04/25/2012  . Chest pain 04/17/2012  . Hypertension 04/17/2012  . DM (diabetes mellitus) with peripheral vascular complication ([place]) 81/82/9937  . Hyperlipidemia 04/17/2012  . Tobacco use 04/17/2012    Arliss Journey, PT, DPT  07/04/2019, 4:18 PM  [facility] [address] [place], [place], [address] Phone: [phone]   Fax:  [phone]  Name: Diamond Rice MRN: [id number] Date of Birth: 1971/06/25

## 2019-07-09 ENCOUNTER — Ambulatory Visit: Payer: Medicare Other | Admitting: Physical Therapy

## 2019-07-16 ENCOUNTER — Ambulatory Visit: Payer: Medicare Other | Admitting: Physical Therapy

## 2019-07-23 ENCOUNTER — Ambulatory Visit: Payer: Medicare Other | Admitting: Physical Therapy

## 2019-07-30 ENCOUNTER — Encounter: Payer: Self-pay | Admitting: Physical Therapy

## 2019-07-30 ENCOUNTER — Ambulatory Visit: Payer: Medicare Other | Attending: Internal Medicine | Admitting: Physical Therapy

## 2019-07-30 ENCOUNTER — Other Ambulatory Visit: Payer: Self-pay

## 2019-07-30 VITALS — BP 170/102

## 2019-07-30 DIAGNOSIS — R262 Difficulty in walking, not elsewhere classified: Secondary | ICD-10-CM | POA: Insufficient documentation

## 2019-07-30 DIAGNOSIS — R269 Unspecified abnormalities of gait and mobility: Secondary | ICD-10-CM | POA: Insufficient documentation

## 2019-07-30 DIAGNOSIS — M6281 Muscle weakness (generalized): Secondary | ICD-10-CM | POA: Insufficient documentation

## 2019-07-30 NOTE — Patient Instructions (Signed)
Warning Signs of a Stroke  A stroke is a medical emergency and should be treated right away-every second counts. A stroke is caused by a decrease or block in blood flow to the brain. When this occurs, certain areas of the brain do not get enough oxygen, and brain cells begin to die. A stroke can lead to brain damage and can sometimes be life-threatening. However, if someone having a stroke gets medical treatment right away, he or she has better chances of surviving and recovering from the stroke. Being able to recognize the symptoms of a stroke is very important. Types of strokes There are two main types of strokes:  Ischemic strokes. This is the most common type of stroke. These strokes happen when a blood vessel that supplies blood to the brain is being blocked.  Hemorrhagic strokes. These strokes result from bleeding in the brain due to a blood vessel leaking or bursting (rupturing). A transient ischemic attack (TIA) is a "warning stroke" that causes stroke-like symptoms that go away quickly. Unlike a stroke, a TIA does not cause permanent damage to the brain. However, the symptoms of a TIA are the same as a stroke, and they also require medical treatment right away. Having a TIA is a sign that you are at higher risk for a permanent stroke. Warning signs of a stroke The symptoms of stroke may vary and will reflect the part of the brain that is involved. Symptoms usually happen suddenly. "BE FAST" is an easy way to remember the main warning signs of a stroke. B - Balance Signs are dizziness, sudden trouble walking, or loss of balance. E - Eyes Signs are trouble seeing or a sudden change in vision. F - Face Signs are sudden weakness or numbness of the face, or the face or eyelid drooping on one side. A - Arms Signs are weakness or numbness in an arm. This happens suddenly and usually on one side of the body. S - Speech Signs are sudden trouble speaking, slurred speech, or trouble understanding  what people say. T - Time Time to call emergency services. Write down what time symptoms started. Other signs of a stroke Some less common signs of a stroke include:  A sudden, severe headache with no known cause.  Nausea or vomiting.  Seizure. A stroke may be happening even if only one "BE FAST" symptoms is present. These symptoms may represent a serious problem that is an emergency. Do not wait to see if the symptoms will go away. Get medical help right away. Call your local emergency services (911 in the [place] Do not drive yourself to the hospital. Summary  A stroke is a medical emergency and should be treated right away-every second counts.  "BE FAST" is an easy way to remember the main warning signs of a stroke.  Call local emergency services right away if you or someone else has any stroke symptoms, even if the symptoms go away.  Make note of what time the first symptoms appeared. Emergency responders or emergency room staff will need to know this information.  Do not wait to see if symptoms will go away. Call 911 even if only one of the "BE FAST" symptoms appears. This information is not intended to replace advice given to you by your health care provider. Make sure you discuss any questions you have with your health care provider. Document Released: 12/01/2016 Document Revised: 07/27/2017 Document Reviewed: 12/01/2016 Elsevier Patient Education  2020 [employer]  

## 2019-07-30 NOTE — Therapy (Signed)
[facility] [address] [place], [place], [address] Phone: [phone]   Fax:  [phone]  Physical Therapy Treatment  Patient Details  Name: Diamond Rice MRN: [id number] Date of Birth: 09/01/1970 Referring Provider (PT): Johney Maine, MD   Encounter Date: 07/30/2019  PT End of Session - 07/30/19 1448    Visit Number  7   no charge, pt with high BP   Number of Visits  13    Date for PT Re-Evaluation  08/31/19   written for 60 day POC   Authorization Type  Medicare & Medicaid    PT Start Time  1403    PT Stop Time  1435    PT Time Calculation (min)  32 min    Equipment Utilized During Treatment  --    Activity Tolerance  --   limited by high BP   Behavior During Therapy  [facility] for tasks assessed/performed       Past Medical History:  Diagnosis Date  . Anxiety   . Chronic lower back pain 04/17/2012   "just got over some; catched when I walked"  . COPD (chronic obstructive pulmonary disease) ([place])   . Critical lower limb ischemia 05/27/2018  . Depression   . Headache(784.0) 04/17/2012   "~ qod; lately waking up in am w/one"  . High cholesterol   . Hypertension   . Migraines 04/17/2012  . Obesity   . Sleep apnea    CPAP  . Stroke ([place])   . Type II diabetes mellitus ([place]) 08/28/2002    Past Surgical History:  Procedure Laterality Date  . ABDOMINAL AORTOGRAM W/LOWER EXTREMITY Left 05/27/2018   Procedure: ABDOMINAL AORTOGRAM W/LOWER EXTREMITY Runoff and Possible Intervention;  Surgeon: Waynetta Sandy, MD;  Location: [facility];  Service: Cardiovascular;  Laterality: Left;  . APPLICATION OF WOUND VAC Left 06/06/2018   Procedure: APPLICATION OF WOUND VAC;  Surgeon: Waynetta Sandy, MD;  Location: [facility];  Service: Vascular;  Laterality: Left;  . BIOPSY  04/04/2018   Procedure: BIOPSY;  Surgeon: [name], MD;  Location: [facility] ENDOSCOPY;  Service: Endoscopy;;  . CARDIAC  CATHETERIZATION  ~ 2011  . COLONOSCOPY N/A 04/04/2018   Procedure: COLONOSCOPY;  Surgeon: [name], MD;  Location: [facility] ENDOSCOPY;  Service: Endoscopy;  Laterality: N/A;  . LOWER EXTREMITY ANGIOGRAM Left 05/27/2018  . PERIPHERAL VASCULAR INTERVENTION Left 05/27/2018   Procedure: PERIPHERAL VASCULAR INTERVENTION;  Surgeon: Waynetta Sandy, MD;  Location: [facility] [facility];  Service: Cardiovascular;  Laterality: Left;  . POLYPECTOMY  04/04/2018   Procedure: POLYPECTOMY;  Surgeon: [name], MD;  Location: [facility] ENDOSCOPY;  Service: Endoscopy;;  . TRANSMETATARSAL AMPUTATION Left 05/28/2018   Procedure: AMPUTATION TOES THREE, FOUR AND FIVE on left foot;  Surgeon: Waynetta Sandy, MD;  Location: [facility];  Service: Vascular;  Laterality: Left;  . WOUND DEBRIDEMENT Left 06/06/2018   Procedure: DEBRIDEMENT WOUND LEFT FOOT;  Surgeon: Waynetta Sandy, MD;  Location: [facility];  Service: Vascular;  Laterality: Left;    Vitals:   07/30/19 1408 07/30/19 1435  BP: (!) 172/108 (!) 170/102    Subjective Assessment - 07/30/19 1405    Subjective  No R knee pain since the last injection - has been able to walk on it more. Arm pain goes and comes. No falls.    Pertinent History  PMH: rt CVA (2016, residual left sided weakness), lt transmet amputation 05/28/18, Sleep apnea, COPD, smoker, DM, Bipolar, HTN, anxiety, depression.  Patient Stated Goals  To strengthen left side    Currently in Pain?  No/denies    Pain Onset  More than a month ago             Vitals taken today at rest while seated on mat table, BP taken manually at 172/108. Pt reporting that she is asymptomatic and took her BP medication today. PT attempted to call pt's PCP, however therapist unable to speak to anyone at the office and had to leave a message. Educated pt on warning signs/symptoms of a CVA and to go to the ER immediately if she notices any changes. Instructed pt to monitor her BP at home and to make an  appointment with her PCP regarding high BP readings (has had previous episode of high BP in the past at therapy while seated at rest). Pt verbalized understanding. BP taken again at 170/102 - pt still remaining asymptomatic. Pt wheeled out to front of clinic by therapist in manual wheel chair and will be picked up by transportation.                   PT Education - 07/30/19 1447    Education Details  take BP meds at next dose. monitor BP at home and go to ER if any signs/symptoms of CVA. Pt instructed to make appt with PCP regarding high BP.    Person(s) Educated  Patient    Methods  Explanation;Demonstration;Handout    Comprehension  Verbalized understanding;Returned demonstration       PT Short Term Goals - 07/02/19 1412      PT SHORT TERM GOAL #1   Title  Patient will be independent with initial HEP in order to build upon functional gains in therapy. ALL STGS DUE 07/06/19    Baseline  07/02/19: met with current program per pt report. states it is getting easy    Status  Achieved      PT SHORT TERM GOAL #2   Title  Patient will undergo assessment of BERG and TUG to address fall risk - LTG written as appropriate.    Baseline  Berg score 32/56 and TUG 80 sec completed on 06/10/19.    Status  Achieved      PT SHORT TERM GOAL #3   Title  Patient will report worst R knee pain to a 4/10 or less in order to improve tolerance for ambulation at home.    Baseline  07/02/19: reporing as a 7/10 with this session, of note pt nodding off to sleep in wheelchair.    Status  Partially Met      PT SHORT TERM GOAL #4   Title  Patient will ambulate at least 51' with quad base cane and supervision in order to improve functional ambulation at home.    Baseline  limited to 30' at eval with R knee immobilizer due to increased R knee pain    Time  4    Period  Weeks    Status  New        PT Long Term Goals - 06/10/19 1435      PT LONG TERM GOAL #1   Title  Patient will be independent  with final HEP in order to build upon functional gains in therapy. ALL LTGS DUE 08/05/19    Time  8    Period  Weeks    Status  New      PT LONG TERM GOAL #2   Title  Patient will improve  baseline BERG score by at least 6 points in order to demo decreased fall risk.    Baseline  32/56 on 06/10/19    Time  8    Period  Weeks    Status  New      PT LONG TERM GOAL #3   Title  Pt will decrease TUG from 80 sec to <60 sec for improved balance and decreased fall risk.    Baseline  80 sec on 06/10/19    Time  8    Period  Weeks    Status  New      PT LONG TERM GOAL #4   Title  Patient will ambulate at least 150' indoors and outdoors with quad base cane and supervision  in order to improve tolerance for walking in and out of doctors appointments    Time  8    Period  Weeks    Status  New      PT LONG TERM GOAL #5   Title  Patient will decrease 5x sit <> stand time to at least 20 seconds with single UE assist from low mat table in order to demo improved LE strength.    Baseline  41.19 seconds from high mat table, RUE pushing from quad cane.    Time  8    Period  Weeks    Status  New              Patient will benefit from skilled therapeutic intervention in order to improve the following deficits and impairments:     Visit Diagnosis: Difficulty in walking, not elsewhere classified  Gait abnormality  Muscle weakness (generalized)     Problem List Patient Active Problem List   Diagnosis Date Noted  . Snoring 02/19/2019  . Hemorrhagic stroke ([place])   . Diastolic dysfunction   . Diabetes mellitus type 2 in obese ([place])   . Anemia of chronic disease   . Chronic obstructive pulmonary disease ([place])   . ICH (intracerebral hemorrhage) ([place]) 08/30/2018  . Hypertensive heart disease without heart failure 07/04/2018  . Nonrheumatic aortic valve insufficiency 07/04/2018  . Esophageal abnormality   . Class 2 severe obesity due to excess calories with serious comorbidity and body  mass index (BMI) of 35.0 to 35.9 in adult [facility])   . Peripheral vascular disease ([place])   . History of osteomyelitis   . History of amputation of lesser toe ([place])   . Pulmonary embolism ([place]) 06/22/2018  . Microcytic anemia 06/17/2018  . At risk for adverse drug reaction 06/11/2018  . Critical lower limb ischemia 05/27/2018  . GI bleeding 04/02/2018  . Acute renal failure (ARF) ([place]) 04/02/2018  . Hypokalemia 04/02/2018  . Polysubstance abuse ([place]) 04/02/2018  . Diabetic foot ulcer ([place]) 04/02/2018  . Cannabis use disorder, moderate, dependence ([place]) 05/19/2017  . Cocaine use disorder, moderate, dependence ([place]) 05/19/2017  . Schizoaffective disorder, bipolar type ([place]) 10/07/2015  . OSA (obstructive sleep apnea) 06/09/2015  . Vitamin D deficiency 01/06/2015  . Bilateral knee pain 01/05/2015  . Numbness and tingling in right hand 01/05/2015  . Insomnia 12/07/2014  . Right-sided lacunar infarction ([place]) 09/15/2014  . Left hemiparesis ([place]) 09/15/2014  . Dyspnea   . Back pain 09/11/2013  . Psychoactive substance-induced organic mood disorder ([place]) 05/28/2013  . Cocaine abuse with cocaine-induced mood disorder ([place]) 05/28/2013  . Cannabis dependence with cannabis-induced anxiety disorder ([place]) 05/28/2013  . Morbid obesity with BMI of 45.0-49.9, adult ([place]) 04/25/2012  . Chest pain 04/17/2012  .  Hypertension 04/17/2012  . DM (diabetes mellitus) with peripheral vascular complication ([place]) 45/99/7741  . Hyperlipidemia 04/17/2012  . Tobacco use 04/17/2012    Arliss Journey, PT, DPT  07/30/2019, 2:49 PM  [facility] [address] [place], [place], [address] Phone: [phone]   Fax:  [phone]  Name: Diamond Rice MRN: [id number] Date of Birth: 08/09/1971

## 2019-08-07 NOTE — Progress Notes (Signed)
Error

## 2019-08-08 ENCOUNTER — Telehealth (INDEPENDENT_AMBULATORY_CARE_PROVIDER_SITE_OTHER): Payer: Medicare Other | Admitting: Cardiology

## 2019-08-08 ENCOUNTER — Telehealth: Payer: Self-pay

## 2019-08-08 DIAGNOSIS — I10 Essential (primary) hypertension: Secondary | ICD-10-CM

## 2019-08-08 DIAGNOSIS — I351 Nonrheumatic aortic (valve) insufficiency: Secondary | ICD-10-CM

## 2019-08-08 DIAGNOSIS — I517 Cardiomegaly: Secondary | ICD-10-CM

## 2019-08-08 NOTE — Telephone Encounter (Signed)
Spoke with pt about Dr. Percival Spanish request to reschedule pt virtual visit d/t technical difficulties and 1:00pm meeting. Pt agreeable to 11:00am appt on 12/14

## 2019-08-09 IMAGING — CT CT ABD-PELV W/ CM
2 of 5 series · 16 of 46 positions shown, 18 images · IV contrast (APPLIED)
Comparison: 09/02/2013

CLINICAL DATA: Bloody stools in the evening.  No nausea vomiting

EXAM:
CT ABDOMEN AND PELVIS WITH CONTRAST
TECHNIQUE: Multidetector CT imaging of the abdomen and pelvis was performed
using the standard protocol following bolus administration of
intravenous contrast.
CONTRAST:  100mL OMNIPAQUE IOHEXOL 300 MG/ML  SOLN

[Series 3: abdomen 5.0 · axial · 0.98mm/px · z∈[+789,+1169]mm · 13 of 90 slices shown, 15 images]
[im 7/90  soft-tissue]
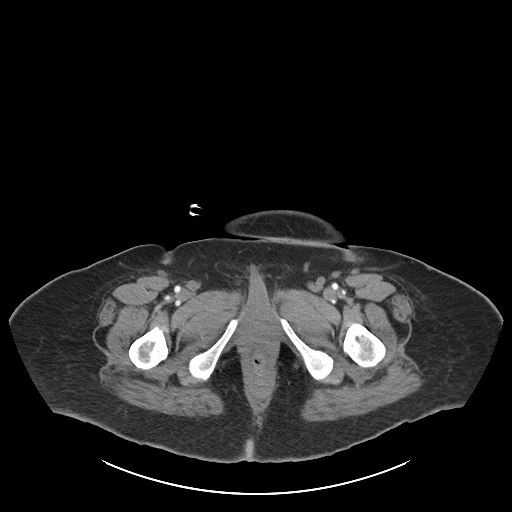
[im 7/90  bone]
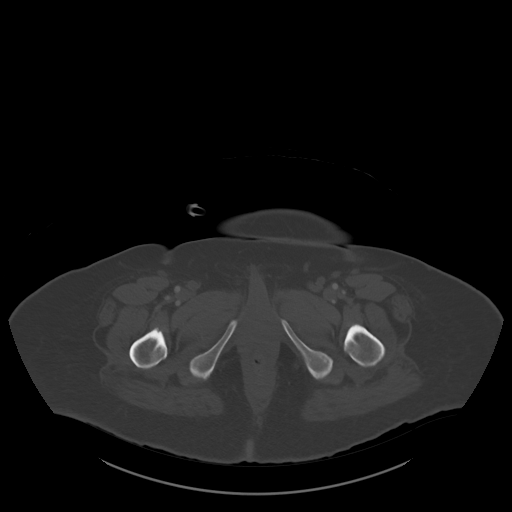
[im 13/90  soft-tissue]
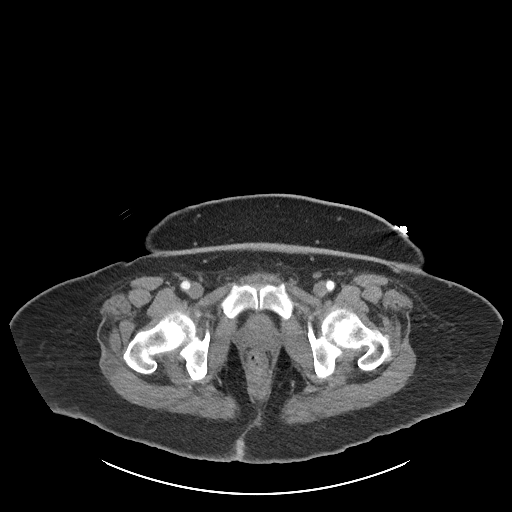
[im 20/90  soft-tissue]
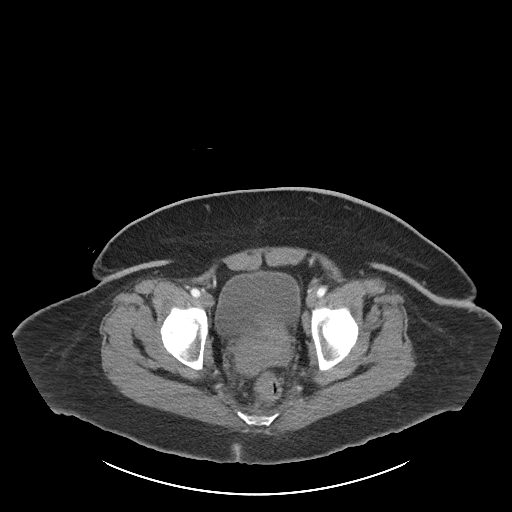
[im 26/90  soft-tissue]
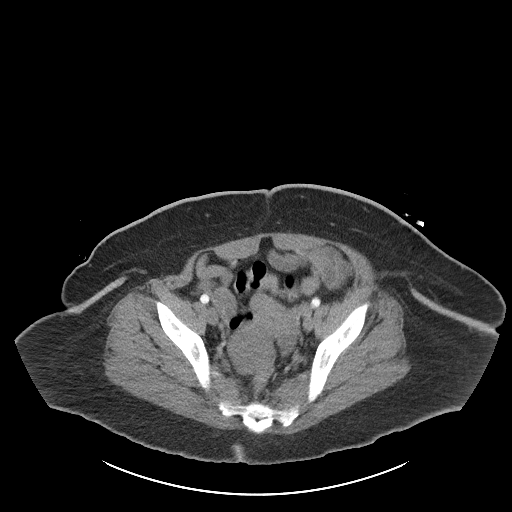
[im 32/90  soft-tissue]
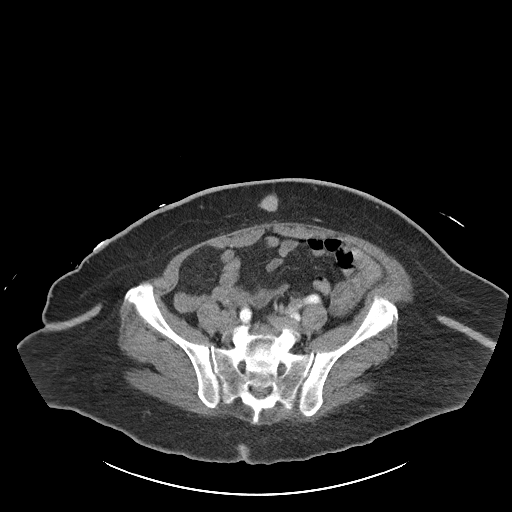
[im 39/90  soft-tissue]
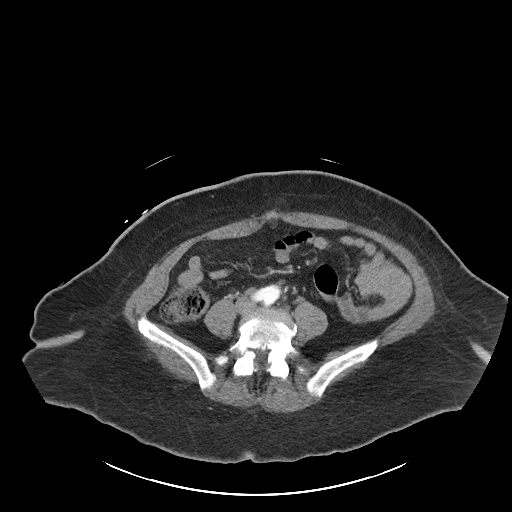
[im 45/90  soft-tissue]
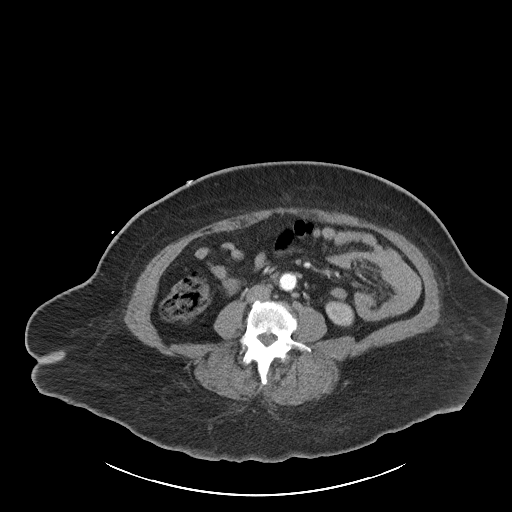
[im 51/90  soft-tissue]
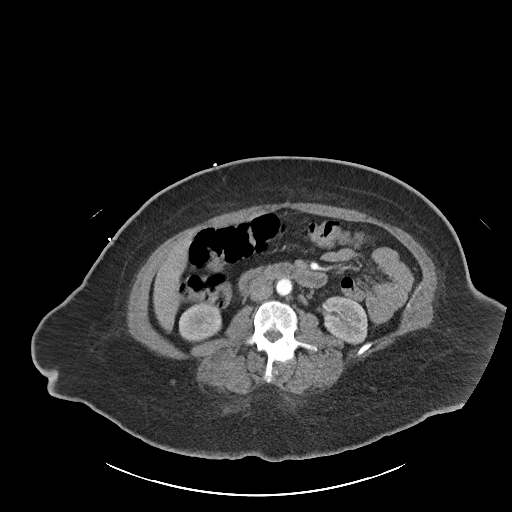
[im 58/90  soft-tissue]
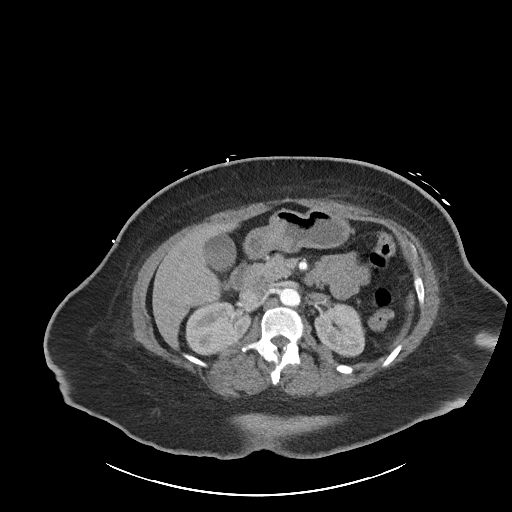
[im 58/90  bone]
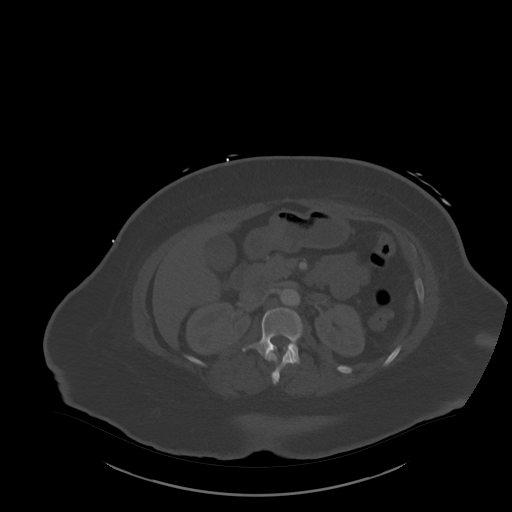
[im 64/90  soft-tissue]
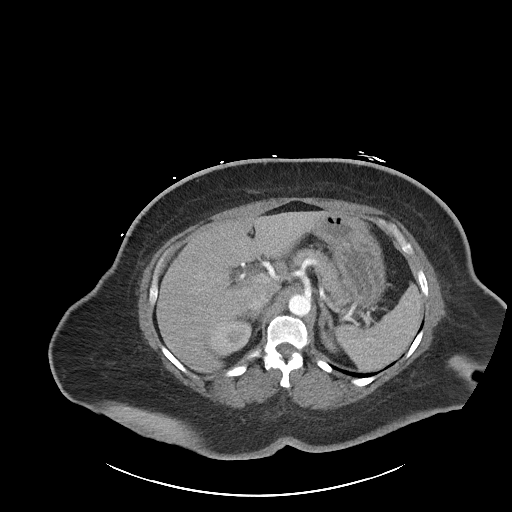
[im 70/90  soft-tissue]
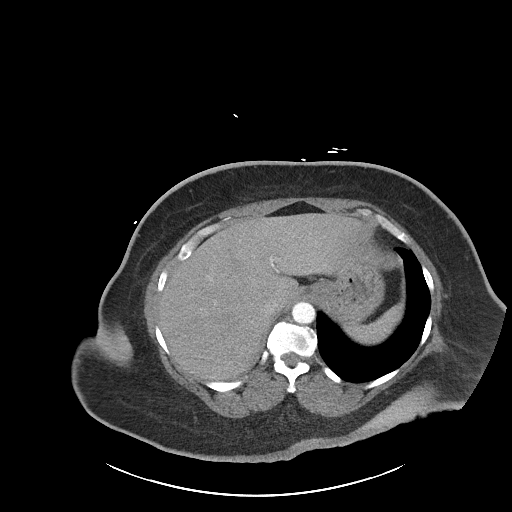
[im 77/90  soft-tissue]
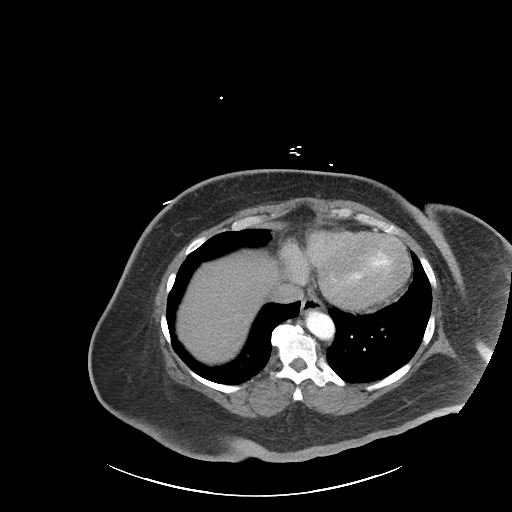
[im 83/90  soft-tissue]
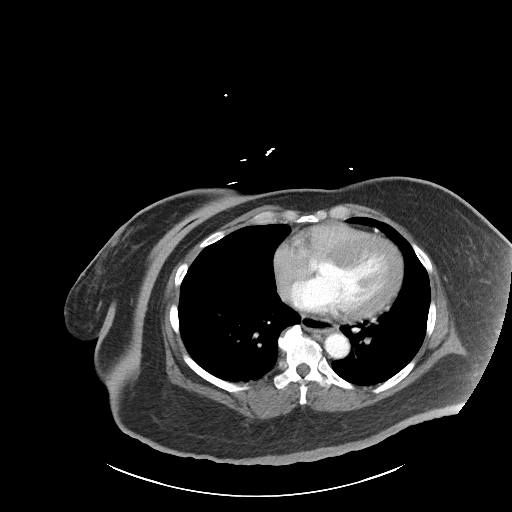

[Series 6: abdomen 3.0 mpr cor · coronal · 0.87mm/px · 3 of 108 slices shown]
[im 36/108  soft-tissue]
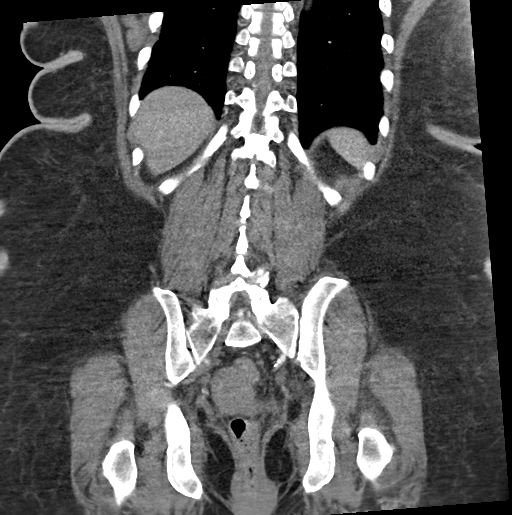
[im 48/108  soft-tissue]
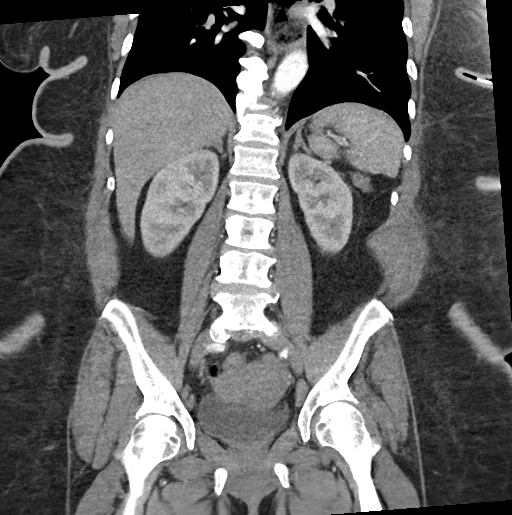
[im 60/108  soft-tissue]
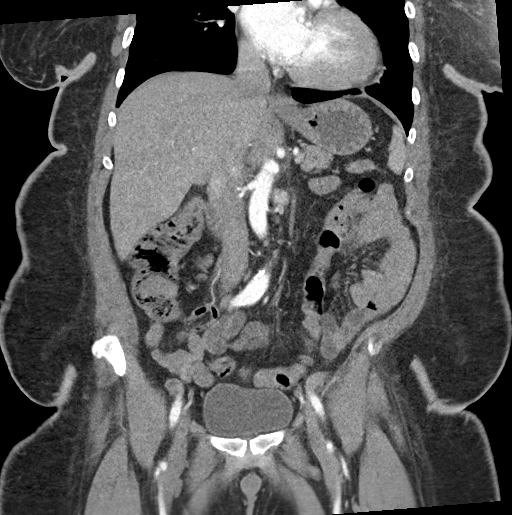

[16 of 46 positions shown; findings below may reference images not displayed]

FINDINGS: Lower chest: No acute abnormality. Distal esophageal air-fluid
level.

Hepatobiliary: No focal liver abnormality is seen. No gallstones,
gallbladder wall thickening, or biliary dilatation.

Pancreas: Unremarkable. No pancreatic ductal dilatation or
surrounding inflammatory changes.

Spleen: Normal in size without focal abnormality.

Adrenals/Urinary Tract: Adrenal glands are unremarkable. Kidneys are
normal, without renal calculi, focal lesion, or hydronephrosis.
Bladder is unremarkable.

Stomach/Bowel: Stomach is within normal limits. Appendix appears
normal. No significant bowel wall thickening. Mild perirectal
inflammatory changes.

Vascular/Lymphatic: No significant vascular findings are present. No
enlarged abdominal or pelvic lymph nodes.

Reproductive: 5 cm right uterine mass most consistent with a
fibroid.

Other: No abdominal wall hernia or abnormality. No abdominopelvic
ascites.

Musculoskeletal: No acute osseous abnormality. No aggressive osseous
lesion. Degenerative disc disease with disc height loss at L5-S1.
Facet arthropathy throughout the lumbar spine. Severe bilateral
foraminal stenosis at L5-S1.
IMPRESSION: 1. Mild perirectal inflammatory changes without bowel wall
thickening concerning for mild proctitis.
2. Lumbar spine spondylosis.

## 2019-08-09 IMAGING — MR MR FOOT*L* WO/W CM
5 of 9 series · 23 of 40 positions shown · IV contrast (multihance)
Comparison: None.

CLINICAL DATA: Nonhealing ulcer along the top of the foot.

EXAM:
MRI OF THE LEFT FOREFOOT WITHOUT AND WITH CONTRAST
TECHNIQUE: Multiplanar, multisequence MR imaging of the left forefoot was
performed both before and after administration of intravenous
contrast.
CONTRAST:  10mL MULTIHANCE GADOBENATE DIMEGLUMINE 529 MG/ML IV SOLN

[Series 9: T2 fat-sat · oblique · 4.0mm · 0.59mm/px · 6 of 27 slices shown]
[im 1/27]
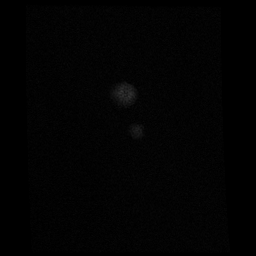
[im 6/27]
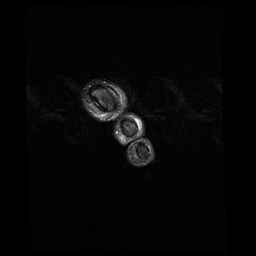
[im 11/27]
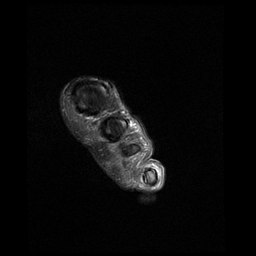
[im 16/27]
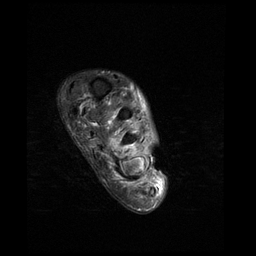
[im 21/27]
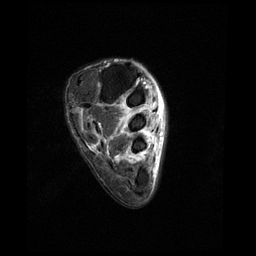
[im 27/27]
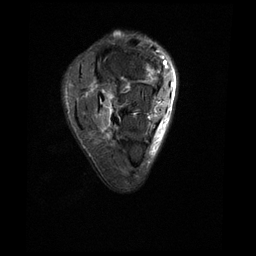

[Series 10: T1 · sagittal · 4.0mm · 0.35mm/px · 4 of 17 slices shown (1 of 2)]
[im 1/17]
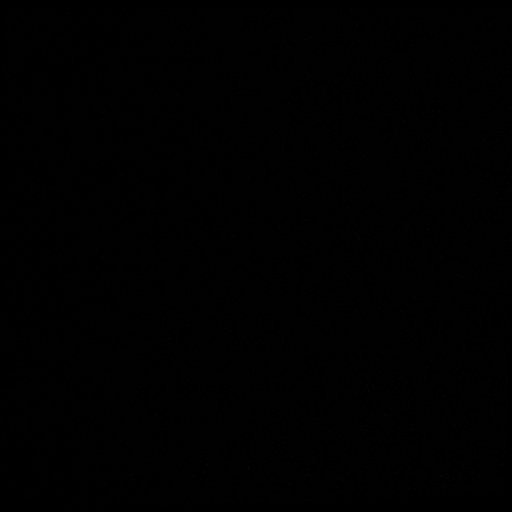
[im 6/17]
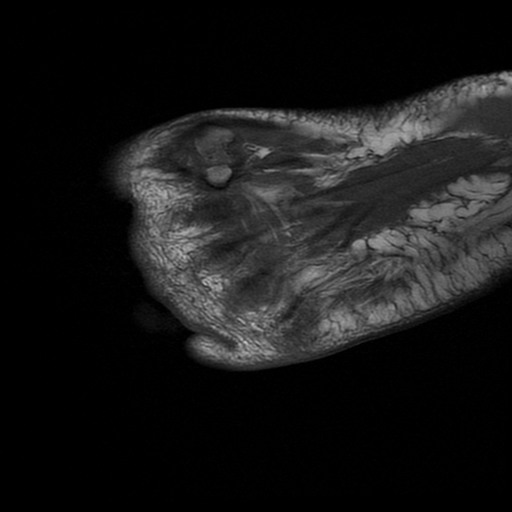
[im 11/17]
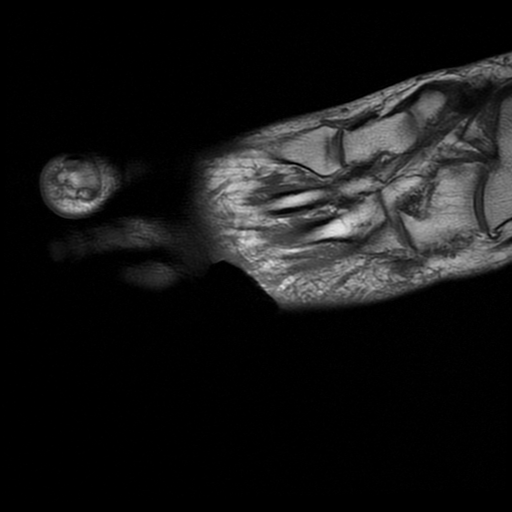
[im 17/17]
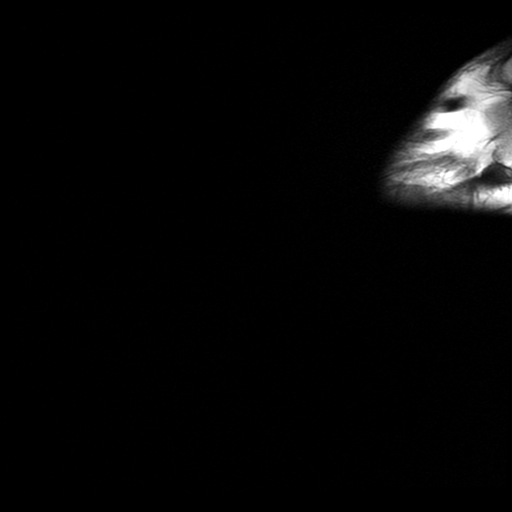

[Series 13: T1 · oblique · 4.0mm · 0.29mm/px · 5 of 27 slices shown (2 of 2)]
[im 1/27]
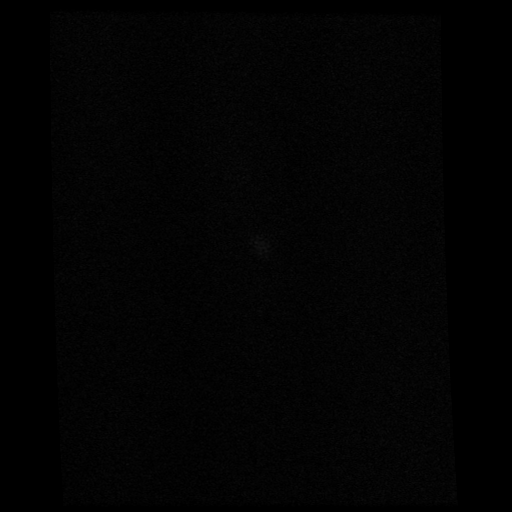
[im 7/27]
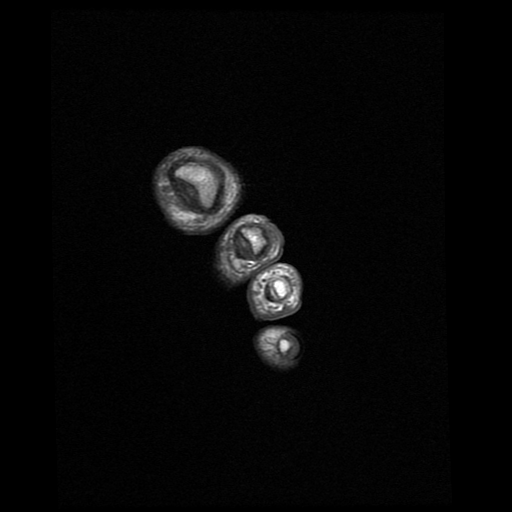
[im 14/27]
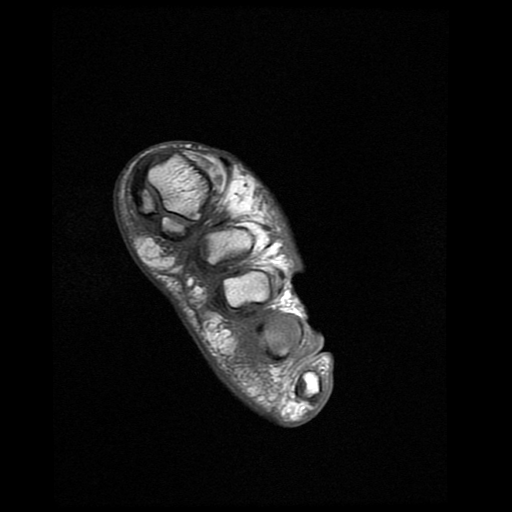
[im 20/27]
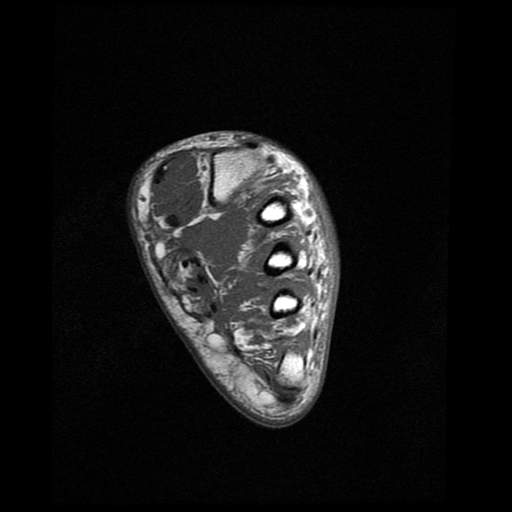
[im 27/27]
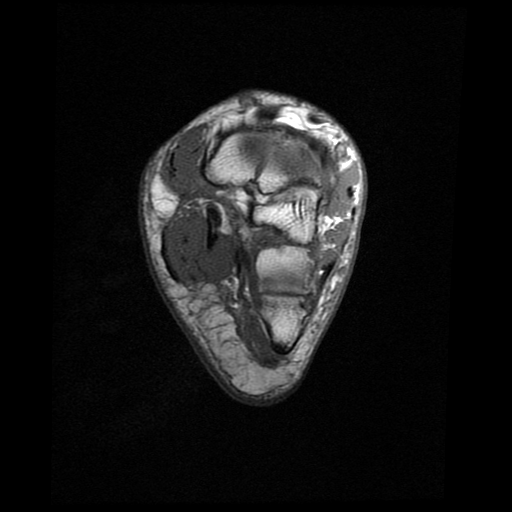

[Series 14: T1 fat-sat · oblique · non-contrast · 4.0mm · 0.29mm/px · 5 of 27 slices shown (1 of 2)]
[im 1/27]
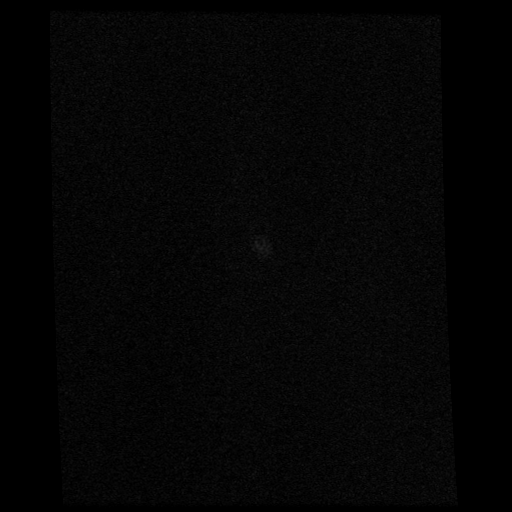
[im 7/27]
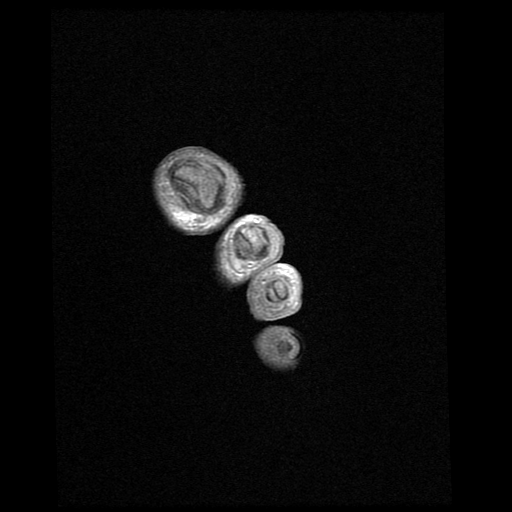
[im 14/27]
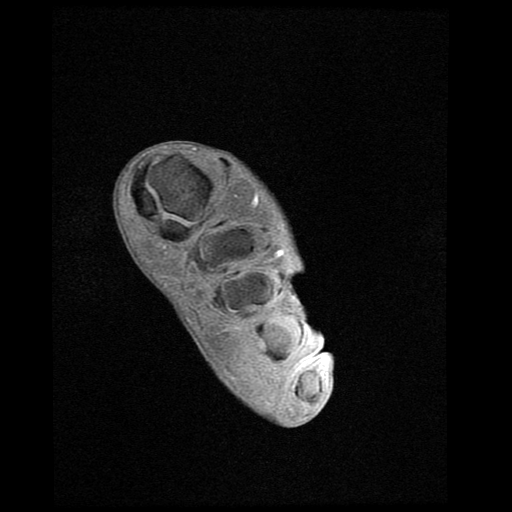
[im 20/27]
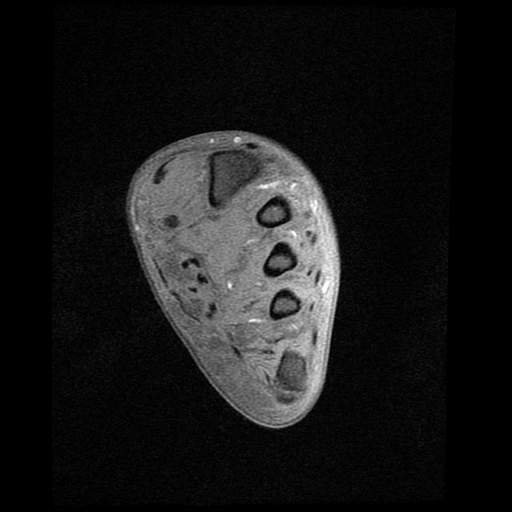
[im 27/27]
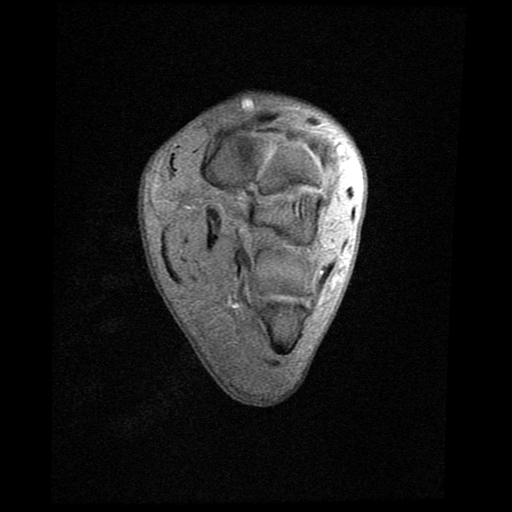

[Series 16: T1 fat-sat · oblique · non-contrast · 4.0mm · 0.29mm/px · 3 of 27 slices shown (2 of 2)]
[im 1/27]
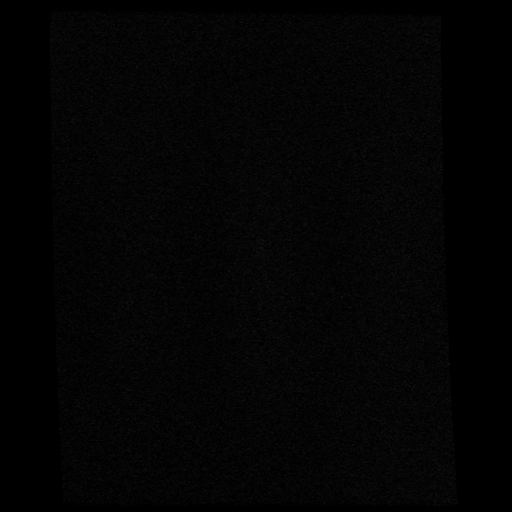
[im 7/27]
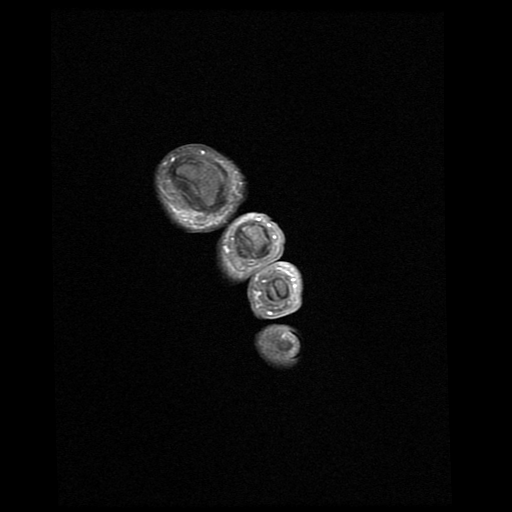
[im 14/27]
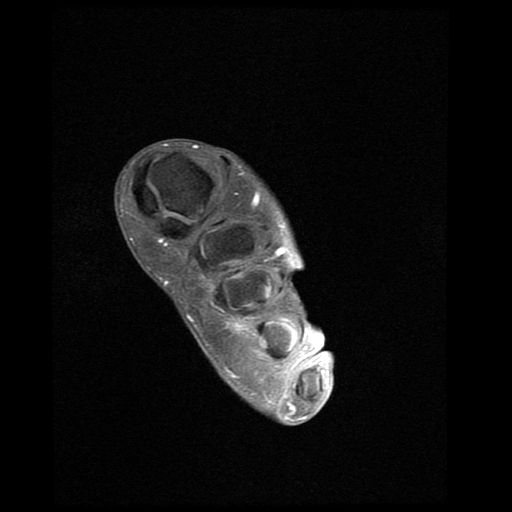

[23 of 40 positions shown; findings below may reference images not displayed]

FINDINGS: Bones/Joint/Cartilage

Soft tissue wound along the dorsal aspect of the forefoot centered
over the fourth metatarsal head. Subcortical marrow edema in the
first metatarsal head and base of the first proximal phalanx with
surrounding soft tissue edema. No definite bone destruction or
periosteal reaction.

No acute fracture or dislocation. Normal alignment. No joint
effusion.

Ligaments

Ligaments are suboptimally evaluated by CT.

Muscles and Tendons
No muscle atrophy. Visualized flexor, extensor and peroneal tendons
are intact.

Soft tissue
No fluid collection or hematoma.  No soft tissue mass.
IMPRESSION: 1. Soft tissue wound along the dorsal aspect of the forefoot
overlying the fourth metatarsal head with surrounding mild
cellulitis. Subcortical marrow edema in the first metatarsal head
and base of the first proximal phalanx with surrounding soft tissue
edema, but without definite bone destruction. Differential
considerations include reactive edema secondary to adjacent
inflammation versus early osteomyelitis.

## 2019-08-10 DIAGNOSIS — I517 Cardiomegaly: Secondary | ICD-10-CM | POA: Insufficient documentation

## 2019-08-10 NOTE — Progress Notes (Signed)
Virtual Visit via Video Note   This visit type was conducted due to national recommendations for restrictions regarding the COVID-19 Pandemic (e.g. social distancing) in an effort to limit this patient's exposure and mitigate transmission in our community.  Due to her co-morbid illnesses, this patient is at least at moderate risk for complications without adequate follow up.  This format is felt to be most appropriate for this patient at this time.  All issues noted in this document were discussed and addressed.  A limited physical exam was performed with this format.  Please refer to the patient's chart for her consent to telehealth for [facility].   Date:  08/11/2019   ID:  [name], DOB [birth year], MRN [id number]  Patient Location: Home Provider Location: Home  PCP:  [name], MD  Cardiologist:  Minus Breeding, MD  Electrophysiologist:  None   Evaluation Performed:  Follow-Up Visit  Chief Complaint:  Fatigue   History of Present Illness:    Diamond Rice is a 48 y.o. female who was referred by Hendricks Limes, MD for evaluation of LVH.  The patient was in the hospital with pulmonary embolism following toe amputation.  Her EF is found to be 65 to 70%.  She is noted to have mild to moderate aortic insufficiency.  There was a mention in her notes of having a previous MI but I do not see a record of this.  I do see that she had a previous perfusion study in 2013 without ischemia or infarct.     She reports that there was a heart catheterization some years ago when I see mention in 08/2009.  She said there was some small vessel disease.  Other than that there is not been any significant cardiac history.  She is had difficult to control hypertension with a strong family history of this.  She had a stroke with left-sided weakness.    She says she feels well.  This was a very difficult virtual appointment.  It had to be canceled from last week.  We had some  connection difficulties this week.  She probably should not be a virtual visit in the future unless it is a phone check for blood pressure management.  She had not taken her medicines when she took her blood pressure this morning.  She does say she is compliant with them.  She says she feels okay.  She denies any shortness of breath, PND or orthopnea.  She had no palpitations, presyncope or syncope.  She had no chest pain.  She has had amputations of some toes but she gets around with a cane.  The patient does not have symptoms concerning for COVID-19 infection (fever, chills, cough, or new shortness of breath).    Past Medical History:  Diagnosis Date  . Anxiety   . Chronic lower back pain 04/17/2012   "just got over some; catched when I walked"  . COPD (chronic obstructive pulmonary disease) ([place])   . Critical lower limb ischemia 05/27/2018  . Depression   . Headache(784.0) 04/17/2012   "~ qod; lately waking up in am w/one"  . High cholesterol   . Hypertension   . Migraines 04/17/2012  . Obesity   . Sleep apnea    CPAP  . Stroke ([place])   . Type II diabetes mellitus ([place]) 08/28/2002   Past Surgical History:  Procedure Laterality Date  . ABDOMINAL AORTOGRAM W/LOWER EXTREMITY Left 05/27/2018   Procedure: ABDOMINAL AORTOGRAM W/LOWER EXTREMITY  Runoff and Possible Intervention;  Surgeon: Waynetta Sandy, MD;  Location: [place] [facility];  Service: Cardiovascular;  Laterality: Left;  . APPLICATION OF WOUND VAC Left 06/06/2018   Procedure: APPLICATION OF WOUND VAC;  Surgeon: Waynetta Sandy, MD;  Location: [place];  Service: Vascular;  Laterality: Left;  . BIOPSY  04/04/2018   Procedure: BIOPSY;  Surgeon: [name], MD;  Location: [facility] ENDOSCOPY;  Service: Endoscopy;;  . CARDIAC CATHETERIZATION  ~ 2011  . COLONOSCOPY N/A 04/04/2018   Procedure: COLONOSCOPY;  Surgeon: [name], MD;  Location: [facility] ENDOSCOPY;  Service: Endoscopy;  Laterality: N/A;  . LOWER EXTREMITY ANGIOGRAM  Left 05/27/2018  . PERIPHERAL VASCULAR INTERVENTION Left 05/27/2018   Procedure: PERIPHERAL VASCULAR INTERVENTION;  Surgeon: Waynetta Sandy, MD;  Location: [place] [facility];  Service: Cardiovascular;  Laterality: Left;  . POLYPECTOMY  04/04/2018   Procedure: POLYPECTOMY;  Surgeon: [name], MD;  Location: [facility] ENDOSCOPY;  Service: Endoscopy;;  . TRANSMETATARSAL AMPUTATION Left 05/28/2018   Procedure: AMPUTATION TOES THREE, FOUR AND FIVE on left foot;  Surgeon: Waynetta Sandy, MD;  Location: [place];  Service: Vascular;  Laterality: Left;  . WOUND DEBRIDEMENT Left 06/06/2018   Procedure: DEBRIDEMENT WOUND LEFT FOOT;  Surgeon: Waynetta Sandy, MD;  Location: [place];  Service: Vascular;  Laterality: Left;     Current Meds  Medication Sig  . ARIPiprazole (ABILIFY) 15 MG tablet Take 1 tablet (15 mg total) by mouth at bedtime.  Marland Kitchen aspirin EC 81 MG EC tablet Take 1 tablet (81 mg total) by mouth daily.  . ferrous sulfate 325 (65 FE) MG tablet Take 1 tablet (325 mg total) by mouth daily with breakfast.  . furosemide (LASIX) 40 MG tablet Take 1 tablet (40 mg total) by mouth daily.  Marland Kitchen gabapentin (NEURONTIN) 100 MG capsule Take 1 capsule (100 mg total) by mouth 2 (two) times daily.  . hydrALAZINE (APRESOLINE) 50 MG tablet Take 1 tablet (50 mg total) by mouth 4 (four) times daily.  . Insulin Detemir (LEVEMIR) 100 UNIT/ML Pen Inject 15 Units into the skin 2 (two) times daily. (Patient taking differently: Inject 15 Units into the skin See admin instructions. Inject 15 units into the skin two times a day- morning and bedtime)  . lip balm (CARMEX) ointment Apply topically as needed for lip care.  Marland Kitchen lisinopril (PRINIVIL,ZESTRIL) 30 MG tablet Take 1 tablet (30 mg total) by mouth daily. (Patient taking differently: Take 40 mg by mouth daily. )  . metoprolol tartrate (LOPRESSOR) 25 MG tablet Take 1 tablet (25 mg total) by mouth 2 (two) times daily.  . pentoxifylline (TRENTAL) 400 MG CR  tablet Take 1 tablet (400 mg total) by mouth 3 (three) times daily with meals.  . potassium chloride SA (K-DUR,KLOR-CON) 20 MEQ tablet Take 1 tablet (20 mEq total) by mouth daily.  . [DISCONTINUED] cloNIDine (CATAPRES) 0.3 MG tablet Take 1 tablet (0.3 mg total) by mouth 3 (three) times daily.     Allergies:   Morphine and related and Metformin   Social History   Tobacco Use  . Smoking status: Former Smoker    Packs/day: 0.25    Years: 28.00    Pack years: 7.00    Types: Cigarettes  . Smokeless tobacco: Never Used  Substance Use Topics  . Alcohol use: No    Alcohol/week: 0.0 standard drinks    Comment: rare  . Drug use: Yes    Types: Marijuana, "Crack" cocaine    Comment: marijuana last  use days ago; last cocaine several months ago     Family Hx: The patient's family history includes Heart attack (age of onset: 36) in her mother; Stroke (age of onset: 46) in her brother and brother; Stroke (age of onset: 19) in her father.  ROS:   Please see the history of present illness.     All other systems reviewed and are negative.   Prior CV studies:   The following studies were reviewed today:  None  Labs/Other Tests and Data Reviewed:    EKG:  No ECG reviewed.  Recent Labs: 06/26/2019: ALT 11; BUN 18; Creatinine, Ser 1.31; Hemoglobin 11.9; Platelets 252; Potassium 3.6; Sodium 140   Lab Results  Component Value Date   HGBA1C 7.0 (H) 09/01/2018    Recent Lipid Panel Lab Results  Component Value Date/Time   CHOL 158 09/01/2018 03:20 AM   TRIG 42 09/01/2018 03:20 AM   HDL 46 09/01/2018 03:20 AM   CHOLHDL 3.4 09/01/2018 03:20 AM   LDLCALC 104 (H) 09/01/2018 03:20 AM    Wt Readings from Last 3 Encounters:  02/19/19 195 lb (88.5 kg)  02/11/19 195 lb (88.5 kg)  09/01/18 195 lb 1.7 oz (88.5 kg)     Objective:    Vital Signs:  BP (!) 205/136    VITAL SIGNS:  reviewed GEN:  no acute distress EYES:  sclerae anicteric, EOMI - Extraocular Movements Intact NEURO:   alert and oriented x 3, no obvious focal deficit PSYCH:  normal affect  Groggy  ASSESSMENT & PLAN:    LVH:    This is related to hypertensive heart disease.    She needs better blood pressure control as below.   AI:  I will follow this up with an echo in the future.   HTN: Her blood pressure is dangerously elevated.  She just took her medicines.  Were going to call her back later today and if her blood pressure is still elevated she will likely need to go to the emergency room.  We need to switch her clonidine to her Catapres patch #3.  She needs to be enrolled in our Hypertension Clinic.  She needs to be seen in person at the end of this week if she does not end up in the emergency room.  DM: A1c was 7.  No change in therapy.    COVID-19 Education: The signs and symptoms of COVID-19 were discussed with the patient and how to seek care for testing (follow up with PCP or arrange E-visit).  We talked about the vaccine.  The importance of social distancing was discussed today.  Time:   Today, I have spent 25 minutes with the patient with telehealth technology discussing the above problems.     Medication Adjustments/Labs and Tests Ordered: Current medicines are reviewed at length with the patient today.  Concerns regarding medicines are outlined above.   Tests Ordered: No orders of the defined types were placed in this encounter.   Medication Changes: Meds ordered this encounter  Medications  . cloNIDine (CATAPRES - DOSED IN MG/24 HR) 0.3 mg/24hr patch    Sig: Place 1 patch (0.3 mg total) onto the skin once a week.    Dispense:  4 patch    Refill:  12    Follow Up:  In Person in the HTN clinic this week.   Signed, Minus Breeding, MD  08/11/2019 4:10 PM    [facility]

## 2019-08-11 ENCOUNTER — Telehealth: Payer: Self-pay | Admitting: Pharmacist

## 2019-08-11 ENCOUNTER — Encounter: Payer: Self-pay | Admitting: Cardiology

## 2019-08-11 ENCOUNTER — Telehealth (INDEPENDENT_AMBULATORY_CARE_PROVIDER_SITE_OTHER): Payer: Medicare Other | Admitting: Cardiology

## 2019-08-11 VITALS — BP 205/136

## 2019-08-11 DIAGNOSIS — I351 Nonrheumatic aortic (valve) insufficiency: Secondary | ICD-10-CM | POA: Diagnosis not present

## 2019-08-11 DIAGNOSIS — E1151 Type 2 diabetes mellitus with diabetic peripheral angiopathy without gangrene: Secondary | ICD-10-CM | POA: Diagnosis not present

## 2019-08-11 DIAGNOSIS — I517 Cardiomegaly: Secondary | ICD-10-CM | POA: Diagnosis not present

## 2019-08-11 DIAGNOSIS — I10 Essential (primary) hypertension: Secondary | ICD-10-CM | POA: Diagnosis not present

## 2019-08-11 MED ORDER — CLONIDINE 0.3 MG/24HR TD PTWK: 0.3000 mg | MEDICATED_PATCH | TRANSDERMAL | 12 refills | Status: DC

## 2019-08-11 NOTE — Patient Instructions (Addendum)
Medication Instructions:  Your physician has recommended you make the following change in your medication:   STOP TAKING YOUR CLONIDINE (CATAPRES) TABLET.  START TAKING CLONIDINE (CATAPRES) 0.3 MG PATCH. PLACE ONE PATCH ONTO THE SKIN ONCE A WEEK  *If you need a refill on your cardiac medications before your next appointment, please call your pharmacy*  Lab Work: NONE If you have labs (blood work) drawn today and your tests are completely normal, you will receive your results only by: Marland Kitchen MyChart Message (if you have MyChart) OR . A paper copy in the mail If you have any lab test that is abnormal or we need to change your treatment, we will call you to review the results.  Testing/Procedures: NONE  Follow-Up: At [facility], you and your health needs are our priority.  As part of our continuing mission to provide you with exceptional heart care, we have created designated Provider Care Teams.  These Care Teams include your primary Cardiologist (physician) and Advanced Practice Providers (APPs -  Physician Assistants and Nurse Practitioners) who all work together to provide you with the care you need, when you need it.  Your next appointment:   THIS WEEK (YOU WILL BE CONTACTED TO SET UP AN APPOINTMENT)  The format for your next appointment:   IN PERSON  Provider:   CLINICAL PHARMACIST-- HYPERTENSION CLINIC LOCATION: [place] Address: [address], [place], [place] [address]   Other Instructions YOUR HOME BLOOD PRESSURE READING WAS [place]. IF THIS REMAINS THE CASE WHEN YOU ARE CONTACTED LATER THIS AFTERNOON, YOU WILL NEED TO PRESENT TO YOUR NEAREST EMERGENCY DEPARTMENT FOR FURTHER EVALUATION. IF, NOT PLEASE PROCEED WITH YOUR APPOINTMENT IN THE HYPERTENSION CLINIC.

## 2019-08-11 NOTE — Telephone Encounter (Signed)
-----   Message from Annita Brod, RN sent at 08/11/2019  1:25 PM EST ----- Regarding: ELEVATED BLOOD PRESSURE READING Hello,  Dr. Percival Spanish saw this patient virtually today. Her appointment had to be postponed until after the rest of his patients due to technical difficulties. She reported a home BP reading of 205/136 (before medications). He requested pharmD to follow up on this. He also wanted a follow up BP reading from this patient later this afternoon and would like her to be set up for Hypertension Clinic appointment sometime this week if she does not end up in the ED. It has not been a full hour since she took her medications. I am off for the afternoon. Please reach out to this patient.  Thank you

## 2019-08-11 NOTE — Telephone Encounter (Signed)
Appt given to patient for HTN clinic follow up Dec/18/2020.  Currently on the phone setting transportation. Will call patient back in 15-20 minutes .   08/11/2019 @ 2:22pm patient reports BP of 132/86  > 1 hrs after taking her medication.

## 2019-08-13 ENCOUNTER — Telehealth: Payer: Self-pay

## 2019-08-13 NOTE — Telephone Encounter (Signed)
Patient and/or DPR-approved person aware of AVS instructions and verbalized understanding.  Letter including After Visit Summary and any other necessary documents to be mailed to the patient's address on file.  Staff message sent to Medical Records pool to mail AVS to patient address.

## 2019-08-13 NOTE — Progress Notes (Signed)
Patient ID: Diamond Rice                 DOB: 03-11-1971                      MRN: [id number]     HPI: Diamond Rice is a 48 y.o. female referred by Dr. Percival Spanish to HTN clinic. PMH is significant for HTN, T2DM, hx of PE (2019), PVD, LVH, OSA, COPD, hx of GI bleed (2019), hx of hemorrhagic stroke (2019), microcytic anemia, hx of amputation, hx of osteomyelitis, diastolic HF, and schizoaffective disorder (bipolar type). ECHO in 2019 showed LV EF: 65% - 70%.She is noted to have mild to moderate aortic insufficiency. Also, patient has had previous perfusion study in 2013 without ischemia or infarct.   Patient presents today for initial appt with HTN clinic. Patient has not started clonidine patch at this time since she has not picked it up from the pharmacy. Patient states she is taking lisinopril 40 mg daily rather than 30 mg daily. Patient states she is taking hydralazine three times daily, not four times daily as med list instructs. She has not needed to use furosemide lately. Patient does not monitor her blood pressure at home. She states she takes lisinopril when she wakes up (10AM - 12 PM), metoprolol (first dose: 10AM-12PM, second dose 5-6PM), hydralazine (first dose: 10AM-12PM, second dose: between 12PM - 5 PM, third dose 5-6PM), and clonidine three times daily (first dose: 10AM-12PM, second dose: between 12PM - 5 PM, third dose 5-6PM). She does not use pseudoephedrine or NSAIDs. Patient denies s/sx of hypotension and angina. She reports taking her BP medications this morning. Patient reports adherence to BP medications and states she does not forget doses. Patient states she is unable to afford her copay this month. Patient would prefer to follow with cardiology at [facility] office moving forward due to location convenience.   Current HTN meds: lisinopril 40 mg daily, metoprolol tartrate 25 mg BID, hydralazine 50 mg TID, clonidine 0.3 mg patch once weekly, furosemide 40 mg daily --Potassium Cl  40 mEq daily  Previously tried: amlodipine (unknown why she stopped), clonidine tablet (switched to clonidine patch), HCTZ (hypokalemia) BP goal: <130/80 mmHg  Family History: Heart attack (age of onset: 38) in her mother; Stroke (age of onset: 97) in her brother and brother; Stroke (age of onset: 50) in her father.   Social History: former smoker, no alcohol use  Diet: eats 1 meal per day (dinner) -Dinner: baked foods (chicken, [place] bacon, [place] burgers), canned vegetables 1x per week -Drink: water, coffee (once every couple of months) -Denies eating red meat, microwave meals, drinking soda/tea -Eats fast food about 1-2x per month  Exercise: walks 1x per week  Home BP readings: Not monitoring  Wt Readings from Last 3 Encounters:  02/19/19 195 lb (88.5 kg)  02/11/19 195 lb (88.5 kg)  09/01/18 195 lb 1.7 oz (88.5 kg)   BP Readings from Last 3 Encounters:  08/11/19 (!) 205/136  07/30/19 (!) 170/102  07/04/19 (!) 150/90   Pulse Readings from Last 3 Encounters:  07/04/19 63  07/02/19 69  06/26/19 85    Renal function: CrCl cannot be calculated (Patient's most recent lab result is older than the maximum 21 days allowed.).  Past Medical History:  Diagnosis Date  . Anxiety   . Chronic lower back pain 04/17/2012   "just got over some; catched when I walked"  . COPD (chronic obstructive pulmonary disease) ([place])   .  Critical lower limb ischemia 05/27/2018  . Depression   . Headache(784.0) 04/17/2012   "~ qod; lately waking up in am w/one"  . High cholesterol   . Hypertension   . Migraines 04/17/2012  . Obesity   . Sleep apnea    CPAP  . Stroke ([place])   . Type II diabetes mellitus ([place]) 08/28/2002    Current Outpatient Medications on File Prior to Visit  Medication Sig Dispense Refill  . ARIPiprazole (ABILIFY) 15 MG tablet Take 1 tablet (15 mg total) by mouth at bedtime. 30 tablet 0  . aspirin EC 81 MG EC tablet Take 1 tablet (81 mg total) by mouth daily.    .  cloNIDine (CATAPRES - DOSED IN MG/24 HR) 0.3 mg/24hr patch Place 1 patch (0.3 mg total) onto the skin once a week. 4 patch 12  . feeding supplement, GLUCERNA SHAKE, (GLUCERNA SHAKE) LIQD Take 237 mLs by mouth 3 (three) times daily between meals. (Patient not taking: Reported on 08/11/2019)  0  . ferrous sulfate 325 (65 FE) MG tablet Take 1 tablet (325 mg total) by mouth daily with breakfast. 30 tablet 0  . furosemide (LASIX) 40 MG tablet Take 1 tablet (40 mg total) by mouth daily. 30 tablet 0  . gabapentin (NEURONTIN) 100 MG capsule Take 1 capsule (100 mg total) by mouth 2 (two) times daily.    . hydrALAZINE (APRESOLINE) 50 MG tablet Take 1 tablet (50 mg total) by mouth 4 (four) times daily. 120 tablet 0  . Insulin Detemir (LEVEMIR) 100 UNIT/ML Pen Inject 15 Units into the skin 2 (two) times daily. (Patient taking differently: Inject 15 Units into the skin See admin instructions. Inject 15 units into the skin two times a day- morning and bedtime) 15 mL 0  . lip balm (CARMEX) ointment Apply topically as needed for lip care. 7 g 0  . lisinopril (PRINIVIL,ZESTRIL) 30 MG tablet Take 1 tablet (30 mg total) by mouth daily. (Patient taking differently: Take 40 mg by mouth daily. ) 30 tablet 0  . metoprolol tartrate (LOPRESSOR) 25 MG tablet Take 1 tablet (25 mg total) by mouth 2 (two) times daily. 60 tablet 0  . pentoxifylline (TRENTAL) 400 MG CR tablet Take 1 tablet (400 mg total) by mouth 3 (three) times daily with meals. 90 tablet 0  . potassium chloride SA (K-DUR,KLOR-CON) 20 MEQ tablet Take 1 tablet (20 mEq total) by mouth daily. 30 tablet 0   No current facility-administered medications on file prior to visit.    Allergies  Allergen Reactions  . Morphine And Related Hives  . Metformin Diarrhea     Assessment/Plan:  1. Hypertension - BP goal <130/80 mmHg; therefore patient is not at goal. Plan for patient to switch from metoprolol 12.5 mg BID to carvedilol 25 mg BID AND to increase hydralazine  to 100mg  TID (once in the morning when she wakes up, once with dinner, and once before bedtime to help with adherence). Counseled patient on mechanism of action, dosing, administration, and side effects. Plan for patient to continue clonidine 0.3 mg TID until she is able to pick up clonidine 0.3 mg patch. Continue lisinopril 40 mg daily. Considering patient is unable to afford her copay, instructed her to ask pharmacy to waive copay since she has Medicaid insurance. Stressed the importance of monitoring blood pressure daily. Patient verbalized understanding to the plan. Will follow up with patient in 2-3 weeks to re-assess blood pressure management. Confirmed patient's phone number in chart matches patient's current phone  number.     Future: spironolactone 12.5 mg daily (re-check BMP), re-trial of amlodipine 5 mg daily, indapamide 1.25 mg daily (may effect electrolytes less than other thiazides)  Thank you for involving pharmacy to assist in providing Diamond Rice's care.   Drexel Iha, PharmD PGY2 Ambulatory Care Pharmacy Resident

## 2019-08-15 ENCOUNTER — Ambulatory Visit (INDEPENDENT_AMBULATORY_CARE_PROVIDER_SITE_OTHER): Payer: Medicare Other | Admitting: Pharmacist

## 2019-08-15 ENCOUNTER — Other Ambulatory Visit: Payer: Self-pay

## 2019-08-15 VITALS — BP 170/106 | HR 66

## 2019-08-15 DIAGNOSIS — I10 Essential (primary) hypertension: Secondary | ICD-10-CM | POA: Diagnosis not present

## 2019-08-15 MED ORDER — CARVEDILOL 25 MG PO TABS: 25.0000 mg | ORAL_TABLET | Freq: Two times a day (BID) | ORAL | 11 refills | Status: DC

## 2019-08-15 NOTE — Patient Instructions (Addendum)
It was a pleasure seeing you in clinic today Ms. Scripture!  Today the plan is... 1. START monitoring your blood pressure! Specifically, date, time, blood pressure reading, and pulse 2. START taking carvedilol 25 mg twice daily 3. STOP taking metoprolol 4. START taking hydralazine 100 mg three times daily (once in the morning, once with dinner, and once before bed). You can you two tablets of current supply of hydralazine 50 mg until you run out.  5. Continue lisinopril 40 mg daily and clonidine (either 0.3 mg three times daily or 0.3 mg weekly). 75. Stanton Kidney will call you in 2-3 weeks (08/29/2019 or 09/05/2019) to see how you are doing  Please call the PharmD clinic at [phone] if you have any questions that you would like to speak with a pharmacist about [facility]ney, [place], [place]).

## 2019-08-19 ENCOUNTER — Ambulatory Visit
Admission: RE | Admit: 2019-08-19 | Discharge: 2019-08-19 | Disposition: A | Discharge status: Home / Self Care | Payer: Medicare Other | Source: Ambulatory Visit | Attending: Internal Medicine | Admitting: Internal Medicine

## 2019-08-19 ENCOUNTER — Other Ambulatory Visit: Payer: Self-pay

## 2019-08-19 DIAGNOSIS — Z1231 Encounter for screening mammogram for malignant neoplasm of breast: Secondary | ICD-10-CM

## 2019-08-25 ENCOUNTER — Other Ambulatory Visit: Payer: Self-pay | Admitting: Internal Medicine

## 2019-08-25 DIAGNOSIS — R928 Other abnormal and inconclusive findings on diagnostic imaging of breast: Secondary | ICD-10-CM

## 2019-08-29 ENCOUNTER — Telehealth: Payer: Self-pay | Admitting: Pharmacist

## 2019-08-29 NOTE — Telephone Encounter (Signed)
Called patient on 08/29/2019 at 1:59 PM and left HIPAA-compliant VM stating I would re-attempt call in 1-2 weeks.  Plan to re-assess BP and discuss recent initiation of carvedilol 25 mg BID (switched from metoprolol), increase in hydralazine dosing to 100 mg TID, and initiation of clonidine 0.3 mg patch weekly (switched from oral dosing of clonidine 0.3 mg TID). Patient was continued on lisinopril 40 mg daily.   Future: spironolactone 12.5 mg daily (re-check BMP), re-trial of amlodipine 5 mg daily, indapamide 1.25 mg daily (may effect electrolytes less than other thiazides)  Drexel Iha, PharmD PGY2 Ambulatory Care Pharmacy Resident

## 2019-09-04 ENCOUNTER — Other Ambulatory Visit: Payer: Medicare Other

## 2019-09-05 NOTE — Telephone Encounter (Signed)
Called patient on 09/05/2019 at 2:41 PM and left HIPAA-compliant VM with instructions to call [place] clinic back   Plan to re-assess BP and discuss recent initiation of carvedilol 25 mg BID (switched from metoprolol), increase in hydralazine dosing to 100 mg TID, and initiation of clonidine 0.3 mg patch weekly (switched from oral dosing of clonidine 0.3 mg TID). Patient was continued on lisinopril 40 mg daily.   Future: spironolactone 12.5 mg daily(re-check BMP),re-trial of amlodipine 5 mg daily,indapamide 1.25 mg daily (mayeffect electrolytes lessthan other thiazides)  Will re-attempt to contact patient in 1-2 weeks.  Drexel Iha, PharmD PGY2 Ambulatory Care Pharmacy Resident

## 2019-09-09 ENCOUNTER — Telehealth: Payer: Self-pay | Admitting: Pharmacist

## 2019-09-09 NOTE — Telephone Encounter (Signed)
Called patient on 09/09/2019 at 2:22 PM and left HIPAA-compliant VM with instructions to call [place] clinic back   Plan to re-assess BP and discuss recent initiation of carvedilol 25 mg BID (switched from metoprolol), increase in hydralazine dosing to 100 mg TID, and initiation of clonidine 0.3 mg patch weekly (switched from oral dosing of clonidine 0.3 mg TID). Patient was continued on lisinopril 40 mg daily.  Future: spironolactone 12.5 mg daily(re-check BMP),re-trial of amlodipine 5 mg daily,indapamide 1.25 mg daily (mayeffect electrolytes lessthan other thiazides)  Will re-attempt to contact patient in 2 weeks.  Drexel Iha, PharmD PGY2 Ambulatory Care Pharmacy Resident

## 2019-09-10 ENCOUNTER — Other Ambulatory Visit: Payer: Medicare Other

## 2019-09-17 ENCOUNTER — Ambulatory Visit
Admission: RE | Admit: 2019-09-17 | Discharge: 2019-09-17 | Disposition: A | Discharge status: Home / Self Care | Payer: Medicare Other | Source: Ambulatory Visit | Attending: Internal Medicine | Admitting: Internal Medicine

## 2019-09-17 ENCOUNTER — Other Ambulatory Visit: Payer: Self-pay | Admitting: Internal Medicine

## 2019-09-17 ENCOUNTER — Other Ambulatory Visit: Payer: Self-pay

## 2019-09-17 DIAGNOSIS — R928 Other abnormal and inconclusive findings on diagnostic imaging of breast: Secondary | ICD-10-CM

## 2019-09-17 DIAGNOSIS — N6310 Unspecified lump in the right breast, unspecified quadrant: Secondary | ICD-10-CM

## 2019-09-22 ENCOUNTER — Telehealth: Payer: Self-pay | Admitting: Pharmacist

## 2019-09-22 NOTE — Telephone Encounter (Signed)
Called patient on 09/22/2019 at 10:46 AM and left HIPAA-compliant VM with instructions to call [place] clinic at [facility] office back. Patient is typically seen at [facility] office, but had been seen at [facility] office last (able to schedule an appt with [facility] office sooner than [facility] office at the time) for HTN clinic appt.  Third unsuccessful attempt to contact patient. Will await for patient to return call to further manage HTN.  If patient does call back plan to discuss the following: Plan to re-assess BP and discuss recent initiation of carvedilol 25 mg BID (switched from metoprolol), increase in hydralazine dosing to 100 mg TID, and initiation of clonidine 0.3 mg patch weekly (switched from oral dosing of clonidine 0.3 mg TID). Patient was continued on lisinopril 40 mg daily.  Future: spironolactone 12.5 mg daily(re-check BMP),re-trial of amlodipine 5 mg daily,indapamide 1.25 mg daily (mayeffect electrolytes lessthan other thiazides)  Drexel Iha, PharmD PGY2 Ambulatory Care Pharmacy Resident

## 2019-12-06 ENCOUNTER — Emergency Department (HOSPITAL_COMMUNITY): Payer: Medicare Other

## 2019-12-06 ENCOUNTER — Inpatient Hospital Stay (HOSPITAL_COMMUNITY)
Admission: EM | Admit: 2019-12-06 | Discharge: 2019-12-10 | DRG: 065 | Disposition: A | Discharge status: Skilled Nursing Facility | Payer: Medicare Other | Attending: Family Medicine | Admitting: Family Medicine

## 2019-12-06 DIAGNOSIS — N179 Acute kidney failure, unspecified: Secondary | ICD-10-CM

## 2019-12-06 DIAGNOSIS — D509 Iron deficiency anemia, unspecified: Secondary | ICD-10-CM | POA: Diagnosis present

## 2019-12-06 DIAGNOSIS — Z86711 Personal history of pulmonary embolism: Secondary | ICD-10-CM

## 2019-12-06 DIAGNOSIS — Z885 Allergy status to narcotic agent status: Secondary | ICD-10-CM

## 2019-12-06 DIAGNOSIS — R1310 Dysphagia, unspecified: Secondary | ICD-10-CM | POA: Diagnosis not present

## 2019-12-06 DIAGNOSIS — E1122 Type 2 diabetes mellitus with diabetic chronic kidney disease: Secondary | ICD-10-CM | POA: Diagnosis present

## 2019-12-06 DIAGNOSIS — G4733 Obstructive sleep apnea (adult) (pediatric): Secondary | ICD-10-CM | POA: Diagnosis present

## 2019-12-06 DIAGNOSIS — E669 Obesity, unspecified: Secondary | ICD-10-CM | POA: Diagnosis present

## 2019-12-06 DIAGNOSIS — R0902 Hypoxemia: Secondary | ICD-10-CM | POA: Diagnosis present

## 2019-12-06 DIAGNOSIS — Z89422 Acquired absence of other left toe(s): Secondary | ICD-10-CM

## 2019-12-06 DIAGNOSIS — D638 Anemia in other chronic diseases classified elsewhere: Secondary | ICD-10-CM | POA: Diagnosis present

## 2019-12-06 DIAGNOSIS — K5900 Constipation, unspecified: Secondary | ICD-10-CM | POA: Diagnosis present

## 2019-12-06 DIAGNOSIS — Z20822 Contact with and (suspected) exposure to covid-19: Secondary | ICD-10-CM | POA: Diagnosis present

## 2019-12-06 DIAGNOSIS — F1210 Cannabis abuse, uncomplicated: Secondary | ICD-10-CM | POA: Diagnosis present

## 2019-12-06 DIAGNOSIS — Z888 Allergy status to other drugs, medicaments and biological substances status: Secondary | ICD-10-CM

## 2019-12-06 DIAGNOSIS — G8929 Other chronic pain: Secondary | ICD-10-CM | POA: Diagnosis present

## 2019-12-06 DIAGNOSIS — R29810 Facial weakness: Secondary | ICD-10-CM | POA: Diagnosis present

## 2019-12-06 DIAGNOSIS — E785 Hyperlipidemia, unspecified: Secondary | ICD-10-CM | POA: Diagnosis present

## 2019-12-06 DIAGNOSIS — I639 Cerebral infarction, unspecified: Secondary | ICD-10-CM | POA: Diagnosis present

## 2019-12-06 DIAGNOSIS — E1151 Type 2 diabetes mellitus with diabetic peripheral angiopathy without gangrene: Secondary | ICD-10-CM | POA: Diagnosis present

## 2019-12-06 DIAGNOSIS — E119 Type 2 diabetes mellitus without complications: Secondary | ICD-10-CM

## 2019-12-06 DIAGNOSIS — I6381 Other cerebral infarction due to occlusion or stenosis of small artery: Secondary | ICD-10-CM | POA: Diagnosis not present

## 2019-12-06 DIAGNOSIS — J449 Chronic obstructive pulmonary disease, unspecified: Secondary | ICD-10-CM | POA: Diagnosis present

## 2019-12-06 DIAGNOSIS — I6330 Cerebral infarction due to thrombosis of unspecified cerebral artery: Secondary | ICD-10-CM | POA: Diagnosis not present

## 2019-12-06 DIAGNOSIS — Z794 Long term (current) use of insulin: Secondary | ICD-10-CM

## 2019-12-06 DIAGNOSIS — Z87891 Personal history of nicotine dependence: Secondary | ICD-10-CM

## 2019-12-06 DIAGNOSIS — I10 Essential (primary) hypertension: Secondary | ICD-10-CM | POA: Diagnosis not present

## 2019-12-06 DIAGNOSIS — E872 Acidosis: Secondary | ICD-10-CM | POA: Diagnosis present

## 2019-12-06 DIAGNOSIS — I350 Nonrheumatic aortic (valve) stenosis: Secondary | ICD-10-CM | POA: Diagnosis not present

## 2019-12-06 DIAGNOSIS — I69354 Hemiplegia and hemiparesis following cerebral infarction affecting left non-dominant side: Secondary | ICD-10-CM

## 2019-12-06 DIAGNOSIS — R1312 Dysphagia, oropharyngeal phase: Secondary | ICD-10-CM | POA: Diagnosis present

## 2019-12-06 DIAGNOSIS — N1832 Chronic kidney disease, stage 3b: Secondary | ICD-10-CM | POA: Diagnosis present

## 2019-12-06 DIAGNOSIS — Z7982 Long term (current) use of aspirin: Secondary | ICD-10-CM

## 2019-12-06 DIAGNOSIS — R471 Dysarthria and anarthria: Secondary | ICD-10-CM | POA: Diagnosis present

## 2019-12-06 DIAGNOSIS — F419 Anxiety disorder, unspecified: Secondary | ICD-10-CM | POA: Diagnosis present

## 2019-12-06 DIAGNOSIS — Z79899 Other long term (current) drug therapy: Secondary | ICD-10-CM

## 2019-12-06 DIAGNOSIS — R29705 NIHSS score 5: Secondary | ICD-10-CM | POA: Diagnosis present

## 2019-12-06 DIAGNOSIS — F250 Schizoaffective disorder, bipolar type: Secondary | ICD-10-CM | POA: Diagnosis present

## 2019-12-06 DIAGNOSIS — Z823 Family history of stroke: Secondary | ICD-10-CM

## 2019-12-06 DIAGNOSIS — G4700 Insomnia, unspecified: Secondary | ICD-10-CM | POA: Diagnosis present

## 2019-12-06 DIAGNOSIS — E876 Hypokalemia: Secondary | ICD-10-CM | POA: Diagnosis present

## 2019-12-06 DIAGNOSIS — I1310 Hypertensive heart and chronic kidney disease without heart failure, with stage 1 through stage 4 chronic kidney disease, or unspecified chronic kidney disease: Secondary | ICD-10-CM | POA: Diagnosis present

## 2019-12-06 DIAGNOSIS — F1410 Cocaine abuse, uncomplicated: Secondary | ICD-10-CM | POA: Diagnosis present

## 2019-12-06 DIAGNOSIS — F1910 Other psychoactive substance abuse, uncomplicated: Secondary | ICD-10-CM | POA: Diagnosis not present

## 2019-12-06 DIAGNOSIS — Z6834 Body mass index (BMI) 34.0-34.9, adult: Secondary | ICD-10-CM

## 2019-12-06 DIAGNOSIS — Z8249 Family history of ischemic heart disease and other diseases of the circulatory system: Secondary | ICD-10-CM

## 2019-12-06 DIAGNOSIS — I351 Nonrheumatic aortic (valve) insufficiency: Secondary | ICD-10-CM | POA: Diagnosis not present

## 2019-12-06 DIAGNOSIS — R4781 Slurred speech: Secondary | ICD-10-CM | POA: Diagnosis not present

## 2019-12-06 LAB — POCT I-STAT 7, (LYTES, BLD GAS, ICA,H+H)
Acid-Base Excess: 2 mmol/L (ref 0.0–2.0)
Bicarbonate: 24.7 mmol/L (ref 20.0–28.0)
Calcium, Ion: 1.22 mmol/L (ref 1.15–1.40)
HCT: 37 % (ref 36.0–46.0)
Hemoglobin: 12.6 g/dL (ref 12.0–15.0)
O2 Saturation: 95 %
Patient temperature: 98.6
Potassium: 3.3 mmol/L — ABNORMAL LOW (ref 3.5–5.1)
Sodium: 143 mmol/L (ref 135–145)
TCO2: 26 mmol/L (ref 22–32)
pCO2 arterial: 32.6 mmHg (ref 32.0–48.0)
pH, Arterial: 7.487 — ABNORMAL HIGH (ref 7.350–7.450)
pO2, Arterial: 69 mmHg — ABNORMAL LOW (ref 83.0–108.0)

## 2019-12-06 LAB — I-STAT BETA HCG BLOOD, ED (MC, WL, AP ONLY): I-stat hCG, quantitative: <5 m[IU]/mL (ref <5)

## 2019-12-06 LAB — I-STAT CHEM 8, ED
BUN: 17 mg/dL (ref 6–20)
Calcium, Ion: 1.09 mmol/L — ABNORMAL LOW (ref 1.15–1.40)
Chloride: 106 mmol/L (ref 98–111)
Creatinine, Ser: 1.5 mg/dL — ABNORMAL HIGH (ref 0.44–1.00)
Glucose, Bld: 178 mg/dL — ABNORMAL HIGH (ref 70–99)
HCT: 38 % (ref 36.0–46.0)
Hemoglobin: 12.9 g/dL (ref 12.0–15.0)
Potassium: 3.1 mmol/L — ABNORMAL LOW (ref 3.5–5.1)
Sodium: 141 mmol/L (ref 135–145)
TCO2: 24 mmol/L (ref 22–32)

## 2019-12-06 LAB — DIFFERENTIAL
Abs Immature Granulocytes: 0.01 10*3/uL (ref 0.00–0.07)
Basophils Absolute: 0.1 10*3/uL (ref 0.0–0.1)
Basophils Relative: 1 %
Eosinophils Absolute: 0.2 10*3/uL (ref 0.0–0.5)
Eosinophils Relative: 4 %
Immature Granulocytes: 0 %
Lymphocytes Relative: 37 %
Lymphs Abs: 2 10*3/uL (ref 0.7–4.0)
Monocytes Absolute: 0.4 10*3/uL (ref 0.1–1.0)
Monocytes Relative: 8 %
Neutro Abs: 2.6 10*3/uL (ref 1.7–7.7)
Neutrophils Relative %: 50 %

## 2019-12-06 LAB — COMPREHENSIVE METABOLIC PANEL
ALT: 8 U/L (ref 0–44)
AST: 17 U/L (ref 15–41)
Albumin: 3 g/dL — ABNORMAL LOW (ref 3.5–5.0)
Alkaline Phosphatase: 49 U/L (ref 38–126)
Anion gap: 12 (ref 5–15)
BUN: 17 mg/dL (ref 6–20)
CO2: 21 mmol/L — ABNORMAL LOW (ref 22–32)
Calcium: 8.8 mg/dL — ABNORMAL LOW (ref 8.9–10.3)
Chloride: 107 mmol/L (ref 98–111)
Creatinine, Ser: 1.62 mg/dL — ABNORMAL HIGH (ref 0.44–1.00)
GFR calc Af Amer: 43 mL/min — ABNORMAL LOW (ref >60)
GFR calc non Af Amer: 37 mL/min — ABNORMAL LOW (ref >60)
Glucose, Bld: 180 mg/dL — ABNORMAL HIGH (ref 70–99)
Potassium: 3.2 mmol/L — ABNORMAL LOW (ref 3.5–5.1)
Sodium: 140 mmol/L (ref 135–145)
Total Bilirubin: 0.4 mg/dL (ref 0.3–1.2)
Total Protein: 7 g/dL (ref 6.5–8.1)

## 2019-12-06 LAB — CBC
HCT: 37.7 % (ref 36.0–46.0)
Hemoglobin: 11.8 g/dL — ABNORMAL LOW (ref 12.0–15.0)
MCH: 23.3 pg — ABNORMAL LOW (ref 26.0–34.0)
MCHC: 31.3 g/dL (ref 30.0–36.0)
MCV: 74.5 fL — ABNORMAL LOW (ref 80.0–100.0)
Platelets: 273 10*3/uL (ref 150–400)
RBC: 5.06 MIL/uL (ref 3.87–5.11)
RDW: 16.2 % — ABNORMAL HIGH (ref 11.5–15.5)
WBC: 5.3 10*3/uL (ref 4.0–10.5)
nRBC: 0 % (ref 0.0–0.2)

## 2019-12-06 LAB — ETHANOL: Alcohol, Ethyl (B): <10 mg/dL (ref <10)

## 2019-12-06 LAB — CBG MONITORING, ED: Glucose-Capillary: 144 mg/dL — ABNORMAL HIGH (ref 70–99)

## 2019-12-06 LAB — PROTIME-INR
INR: 1 (ref 0.8–1.2)
Prothrombin Time: 13.5 seconds (ref 11.4–15.2)

## 2019-12-06 LAB — APTT: aPTT: 33 seconds (ref 24–36)

## 2019-12-06 MED ORDER — POTASSIUM CHLORIDE CRYS ER 20 MEQ PO TBCR: 40.0000 meq | EXTENDED_RELEASE_TABLET | Freq: Once | ORAL | Status: DC

## 2019-12-06 MED ORDER — ACETAMINOPHEN 325 MG PO TABS
650.0000 mg | ORAL_TABLET | ORAL | Status: DC | PRN
  Filled 2019-12-06 (×2): qty 2

## 2019-12-06 MED ORDER — INSULIN DETEMIR 100 UNIT/ML ~~LOC~~ SOLN
15.0000 [IU] | Freq: Two times a day (BID) | SUBCUTANEOUS | Status: DC
  Administered 2019-12-08 – 2019-12-10 (×5): 15 [IU] via SUBCUTANEOUS
  Filled 2019-12-06 (×8): qty 0.15

## 2019-12-06 MED ORDER — FERROUS SULFATE 325 (65 FE) MG PO TABS
325.0000 mg | ORAL_TABLET | Freq: Every day | ORAL | Status: DC
  Administered 2019-12-08 – 2019-12-10 (×3): 325 mg via ORAL
  Filled 2019-12-06 (×4): qty 1

## 2019-12-06 MED ORDER — ENOXAPARIN SODIUM 40 MG/0.4ML ~~LOC~~ SOLN
40.0000 mg | Freq: Every day | SUBCUTANEOUS | Status: DC
  Administered 2019-12-08 – 2019-12-10 (×3): 40 mg via SUBCUTANEOUS
  Filled 2019-12-06 (×3): qty 0.4

## 2019-12-06 MED ORDER — STROKE: EARLY STAGES OF RECOVERY BOOK
Freq: Once | Status: DC
  Filled 2019-12-06: qty 1

## 2019-12-06 MED ORDER — IPRATROPIUM-ALBUTEROL 0.5-2.5 (3) MG/3ML IN SOLN: 3.0000 mL | Freq: Four times a day (QID) | RESPIRATORY_TRACT | Status: DC | PRN

## 2019-12-06 MED ORDER — ASPIRIN EC 81 MG PO TBEC
81.0000 mg | DELAYED_RELEASE_TABLET | Freq: Every day | ORAL | Status: DC
  Administered 2019-12-08 – 2019-12-10 (×3): 81 mg via ORAL
  Filled 2019-12-06 (×3): qty 1

## 2019-12-06 MED ORDER — ATORVASTATIN CALCIUM 40 MG PO TABS
40.0000 mg | ORAL_TABLET | Freq: Every day | ORAL | Status: DC
  Administered 2019-12-08 – 2019-12-09 (×2): 40 mg via ORAL
  Filled 2019-12-06 (×2): qty 1

## 2019-12-06 MED ORDER — ACETAMINOPHEN 160 MG/5ML PO SOLN: 650.0000 mg | ORAL | Status: DC | PRN

## 2019-12-06 MED ORDER — INSULIN ASPART 100 UNIT/ML ~~LOC~~ SOLN
0.0000 [IU] | Freq: Three times a day (TID) | SUBCUTANEOUS | Status: DC
  Administered 2019-12-10: 2 [IU] via SUBCUTANEOUS

## 2019-12-06 MED ORDER — SODIUM CHLORIDE 0.9 % IV SOLN: INTRAVENOUS | Status: AC

## 2019-12-06 MED ORDER — ARIPIPRAZOLE 10 MG PO TABS
15.0000 mg | ORAL_TABLET | Freq: Every day | ORAL | Status: DC
  Administered 2019-12-08 – 2019-12-09 (×2): 15 mg via ORAL
  Filled 2019-12-06 (×2): qty 2
  Filled 2019-12-06: qty 1

## 2019-12-06 MED ORDER — CLOPIDOGREL BISULFATE 75 MG PO TABS
75.0000 mg | ORAL_TABLET | Freq: Every day | ORAL | Status: DC
  Administered 2019-12-08 – 2019-12-10 (×3): 75 mg via ORAL
  Filled 2019-12-06 (×3): qty 1

## 2019-12-06 MED ORDER — PENTOXIFYLLINE ER 400 MG PO TBCR
400.0000 mg | EXTENDED_RELEASE_TABLET | Freq: Three times a day (TID) | ORAL | Status: DC
  Administered 2019-12-08 – 2019-12-10 (×8): 400 mg via ORAL
  Filled 2019-12-06 (×11): qty 1

## 2019-12-06 MED ORDER — ACETAMINOPHEN 650 MG RE SUPP: 650.0000 mg | RECTAL | Status: DC | PRN

## 2019-12-06 NOTE — ED Triage Notes (Signed)
BIB EMS from home. Unknown LKW. Pt went to speak with friend on telephone approx 1620 and noticed her speech was slurred. Pt has not spoken since yesterday so no clear time with this began. Pt has hx of stroke with L sided deficits. Drowsy. VSS.

## 2019-12-06 NOTE — Consult Note (Addendum)
NEURO HOSPITALIST  CONSULT   Requesting Physician: Dr. Darl Householder    Chief Complaint: slurred speech   History obtained from:  Patient   / EMS/ Chart   review HPI:                                                                                                                                         Diamond Rice is an 49 y.o. female with PMH hemorrhagic stroke in January 2020 ( residual left side weakness), COPD ( not on baseline oxygen, HTN, HLD, DM2, who presented to [facility] ED as a code stroke for slurred speech.    Per EMS and patient she last spoke words at midnight and her speech was normal at that time. At 1600 she woke up from a nap today and at 1620 called a friend and realized her speech was not right. EMS was called. Unknown what time she woke up today, but she states she felt fine, but did not speak so unknown is her speech was normal this morning. Denies any weakness, vision changes  ED course:  CTH: no hemorrhage BP: 158/96 BG: 144 K: 3.1 creatinine 1.50 Chart review of past stroke:  MRI: acute hemorrhage with in posterior right BG. tPA Given:no;  Hemorrhagic CVA  Modified Rankin: Rankin Score=0 NIHSS:5  Past Medical History:  Diagnosis Date  . Anxiety   . Chronic lower back pain 04/17/2012   "just got over some; catched when I walked"  . COPD (chronic obstructive pulmonary disease) ([place])   . Critical lower limb ischemia 05/27/2018  . Depression   . Headache(784.0) 04/17/2012   "~ qod; lately waking up in am w/one"  . High cholesterol   . Hypertension   . Migraines 04/17/2012  . Obesity   . Sleep apnea    CPAP  . Stroke ([place])   . Type II diabetes mellitus ([place]) 08/28/2002    Past Surgical History:  Procedure Laterality Date  . ABDOMINAL AORTOGRAM W/LOWER EXTREMITY Left 05/27/2018   Procedure: ABDOMINAL AORTOGRAM W/LOWER EXTREMITY Runoff and Possible Intervention;  Surgeon: Waynetta Sandy, MD;  Location: [place] [facility];  Service: Cardiovascular;  Laterality: Left;  . APPLICATION OF WOUND VAC Left 06/06/2018   Procedure: APPLICATION OF WOUND VAC;  Surgeon: Waynetta Sandy, MD;  Location: [place];  Service: Vascular;  Laterality: Left;  . BIOPSY  04/04/2018   Procedure: BIOPSY;  Surgeon: [name], MD;  Location: [facility] ENDOSCOPY;  Service: Endoscopy;;  . CARDIAC CATHETERIZATION  ~ 2011  . COLONOSCOPY N/A 04/04/2018   Procedure: COLONOSCOPY;  Surgeon: [name], MD;  Location: [place];  Service:  Endoscopy;  Laterality: N/A;  . LOWER EXTREMITY ANGIOGRAM Left 05/27/2018  . PERIPHERAL VASCULAR INTERVENTION Left 05/27/2018   Procedure: PERIPHERAL VASCULAR INTERVENTION;  Surgeon: Waynetta Sandy, MD;  Location: [place] [facility];  Service: Cardiovascular;  Laterality: Left;  . POLYPECTOMY  04/04/2018   Procedure: POLYPECTOMY;  Surgeon: [name], MD;  Location: [facility] ENDOSCOPY;  Service: Endoscopy;;  . TRANSMETATARSAL AMPUTATION Left 05/28/2018   Procedure: AMPUTATION TOES THREE, FOUR AND FIVE on left foot;  Surgeon: Waynetta Sandy, MD;  Location: [place];  Service: Vascular;  Laterality: Left;  . WOUND DEBRIDEMENT Left 06/06/2018   Procedure: DEBRIDEMENT WOUND LEFT FOOT;  Surgeon: Waynetta Sandy, MD;  Location: [facility] OR;  Service: Vascular;  Laterality: Left;    Family History  Problem Relation Age of Onset  . Stroke Brother 9  . Stroke Brother 6  . Heart attack Mother 16  . Stroke Father 51         Social History:  reports that she has quit smoking. Her smoking use included cigarettes. She has a 7.00 pack-year smoking history. She has never used smokeless tobacco. She reports current drug use. Drugs: Marijuana and "Crack" cocaine. She reports that she does not drink alcohol.  Allergies:  Allergies  Allergen Reactions  . Morphine And Related Hives  . Metformin Diarrhea    Medications:                                                                                                                            No current facility-administered medications for this encounter.   Current Outpatient Medications  Medication Sig Dispense Refill  . ARIPiprazole (ABILIFY) 15 MG tablet Take 1 tablet (15 mg total) by mouth at bedtime. 30 tablet 0  . aspirin EC 81 MG EC tablet Take 1 tablet (81 mg total) by mouth daily.    . carvedilol (COREG) 25 MG tablet Take 1 tablet (25 mg total) by mouth 2 (two) times daily. 60 tablet 11  . cloNIDine (CATAPRES - DOSED IN MG/24 HR) 0.3 mg/24hr patch Place 1 patch (0.3 mg total) onto the skin once a week. (Patient not taking: Reported on 08/15/2019) 4 patch 12  . ferrous sulfate 325 (65 FE) MG tablet Take 1 tablet (325 mg total) by mouth daily with breakfast. 30 tablet 0  . furosemide (LASIX) 40 MG tablet Take 1 tablet (40 mg total) by mouth daily. 30 tablet 0  . gabapentin (NEURONTIN) 100 MG capsule Take 1 capsule (100 mg total) by mouth 2 (two) times daily. (Patient not taking: Reported on 08/15/2019)    . hydrALAZINE (APRESOLINE) 100 MG tablet Take 100 mg by mouth 3 (three) times daily.    . Insulin Detemir (LEVEMIR) 100 UNIT/ML Pen Inject 15 Units into the skin 2 (two) times daily. (Patient taking differently: Inject 15 Units into the skin See admin instructions. Inject 15 units into the skin two times a day- morning and bedtime) 15 mL 0  .  lisinopril (ZESTRIL) 40 MG tablet Take 40 mg by mouth daily.    . pentoxifylline (TRENTAL) 400 MG CR tablet Take 1 tablet (400 mg total) by mouth 3 (three) times daily with meals. 90 tablet 0  . potassium chloride SA (K-DUR,KLOR-CON) 20 MEQ tablet Take 1 tablet (20 mEq total) by mouth daily. 30 tablet 0     ROS:                                                                                                                                       ROS was performed and is negative except as noted in HPI    General Examination:                                                                                                       There were no vitals taken for this visit.  Physical Exam  Constitutional: Appears well-developed and well-nourished.  Psych: Affect appropriate to situation Eyes: Normal external eye and conjunctiva. HENT: Normocephalic, no lesions, without obvious abnormality.   Musculoskeletal-no joint tenderness,  Left foot with 3 toe amputation Cardiovascular: Normal rate and regular rhythm.  Respiratory: Effort normal, non-labored breathing saturations WNL GI: Soft.  No distension. There is no tenderness.  Skin: WDI  Neurological Examination Mental Status: drowsy, oriented, thought content appropriate.  Speech fluent without evidence of aphasia.  Severe dysarthria.  Able to follow commands without difficulty. Hypophonic.  Cranial Nerves: II:  Visual fields grossly normal,  III,IV, VI: ptosis not present, extra-ocular motions intact bilaterally, pupils equal, round, reactive to light and  V,VII: smile symmetric, facial light touch sensation normal bilaterally VIII: hearing normal bilaterally IX,X: uvula rises midline, weak cough and gag response XI: bilateral shoulder shrug XII: midline tongue extension Motor: Right : Upper extremity   5/5 Left:     Upper extremity   4+/5  Lower extremity   5/5  Lower extremity   4+/5 Tone and bulk:normal tone throughout; no atrophy noted Sensory: light touch intact throughout, bilaterally Deep Tendon Reflexes: 2+ and symmetric biceps and patella Cerebellar: No ataxia Gait: deferred   Lab Results: Basic Metabolic Panel: Recent Labs  Lab 12/06/19 1726  NA 141  K 3.1*  CL 106  GLUCOSE 178*  BUN 17  CREATININE 1.50*    CBC: Recent Labs  Lab 12/06/19 1726  HGB 12.9  HCT 38.0    CBG: Recent Labs  Lab 12/06/19 1730  GLUCAP 144*    Imaging: CT HEAD CODE  STROKE WO CONTRAST  Result Date: 12/06/2019 CLINICAL DATA:  Code stroke. 49 year old female with slurred speech. History of prior stroke.  EXAM: CT HEAD WITHOUT CONTRAST TECHNIQUE: Contiguous axial images were obtained from the base of the skull through the vertex without intravenous contrast. COMPARISON:  Head CT 06/26/2019. Brain MRI 09/02/2018. FINDINGS: Brain: Advanced chronic cerebral white matter and deep gray nuclei hypodensity. No acute intracranial hemorrhage identified. No midline shift, mass effect, or evidence of intracranial mass lesion. No ventriculomegaly. Small chronic infarct in the left cerebellum. No cortically based acute infarct identified. Vascular: Calcified atherosclerosis at the skull base. No suspicious intracranial vascular hyperdensity. Skull: Stable hyperostosis. No acute osseous abnormality identified. Sinuses/Orbits: Visualized paranasal sinuses and mastoids are stable and well pneumatized. Other: Leftward gaze. Negative scalp soft tissues. ASPECTS [facility] Stroke Program Early CT Score) Total score (0-10 with 10 being normal): 10 IMPRESSION: 1. Stable CT appearance of advanced chronic small vessel disease since October. ASPECTS 10. 2. These results were communicated to Dr. Lorraine Lax at 5:37 pmon 4/10/2021by text page via the [facility] messaging system. Electronically Signed   By: Genevie Ann M.D.   On: 12/06/2019 17:38       Laurey Morale, MSN, NP-C [facility] [phone]  12/06/2019, 5:32 PM   Attending physician note to follow with Assessment and plan .   NEUROHOSPITALIST ADDENDUM Performed a face to face diagnostic evaluation.   I have reviewed the contents of history and physical exam as documented by PA/ARNP/Resident and agree with above documentation.  I have discussed and formulated the above plan as documented. Edits to the note have been made as needed.   Assessment: 49 y.o. female with  PMH hemorrhagic stroke in January 2020 ( residual left side weakness), COPD ( not on baseline oxygen, HTN, HLD, DM2, who presented to [facility] ED as a code stroke for slurred speech. CTH was negative for  hemorrhage. Patient was not a candidate for TPA given her LKW and hemorrhagic stroke history. Exam is concerning for posterior circulation stroke given her weak cough/gag and drowsy state. MRI Brain w/ contrast shows acute stroke in left internal capsul/basal ganglia  Stroke Risk Factors - hyperlipidemia and hypertension   Acute ischemic stroke   -Outside window for TPA based of LKN 4.5 hrs criteria since LKN was 1 am, since wake up stroke, stat MRI brain performed:  shows early mild flair changes so not a candidate for TPA based on wake-up protocol.  Also prior history of hemorrhagic stroke increases further risk of bleeding  Recommend # MRI of the brain without contrast #MRA Head and neck  #Transthoracic Echo  # Start patient on ASA 81 mg and plavix 75 mg x 3 weeks  #Start/Continue Atorvastatin 40 mg/other high intensity statin # BP goal: permissive HTN upto 220/120 mmHg ( 185/110 if patient has CHF, CKD) # HBAIC and Lipid profile # Telemetry monitoring # Frequent neuro checks #  stroke swallow screen  Please page stroke NP  Or  PA  Or MD from 8am -4 pm  as this patient from this time will be  followed by the stroke.   You can look them up on [email or web address]  Password [facility]      Karena Addison Layza Summa MD [facility] [phone]   If 7pm to 7am, please call on call as listed on AMION.

## 2019-12-06 NOTE — ED Notes (Signed)
Paoli COUSIN WANTS AN  UPDATES .

## 2019-12-06 NOTE — ED Notes (Signed)
If family calls please ask them to get update from cousin Joelene Millin, she is updating family

## 2019-12-06 NOTE — ED Provider Notes (Signed)
[facility] EMERGENCY DEPARTMENT Provider Note   CSN: [id number] Arrival date & time: 12/06/19  1718  An emergency department physician performed an initial assessment on this suspected stroke patient at 1718.  History Chief Complaint  Patient presents with  . Code Stroke    Ebonique Pages is a 49 y.o. female.  The history is provided by the patient, the EMS personnel and medical records. The history is limited by the condition of the patient.     49 year old female with a past medical history of previous hemorrhagic stroke with residual left-sided weakness (08/2018), COPD not on oxygen at baseline, HTN, hypercholesterolemia obesity, sleep apnea, type 2 diabetes presenting to the emergency department brought in by EMS as a code stroke with concern for sudden onset of slurred speech the patient noticed at approximately 12:00 this afternoon.  Last known normal was 0000 this morning.  Patient had not spoken since that time.  Patient had no residual speech deficits following her previous stroke.  Blood glucose with EMS was normal.  Past Medical History:  Diagnosis Date  . Anxiety   . Chronic lower back pain 04/17/2012   "just got over some; catched when I walked"  . COPD (chronic obstructive pulmonary disease) ([place])   . Critical lower limb ischemia 05/27/2018  . Depression   . Headache(784.0) 04/17/2012   "~ qod; lately waking up in am w/one"  . High cholesterol   . Hypertension   . Migraines 04/17/2012  . Obesity   . Sleep apnea    CPAP  . Stroke ([place])   . Type II diabetes mellitus ([place]) 08/28/2002    Patient Active Problem List   Diagnosis Date Noted  . Acute CVA (cerebrovascular accident) ([place]) 12/06/2019  . AKI (acute kidney injury) ([place]) 12/06/2019  . LVH (left ventricular hypertrophy) 08/10/2019  . Snoring 02/19/2019  . Hemorrhagic stroke ([place])   . Diastolic dysfunction   . Type 2 diabetes mellitus ([place])   . Anemia of chronic disease   . Chronic  obstructive pulmonary disease ([place])   . ICH (intracerebral hemorrhage) ([place]) 08/30/2018  . Hypertensive heart disease without heart failure 07/04/2018  . Nonrheumatic aortic valve insufficiency 07/04/2018  . Esophageal abnormality   . Class 2 severe obesity due to excess calories with serious comorbidity and body mass index (BMI) of 35.0 to 35.9 in adult [facility])   . Peripheral vascular disease ([place])   . History of osteomyelitis   . History of amputation of lesser toe ([place])   . Pulmonary embolism ([place]) 06/22/2018  . Microcytic anemia 06/17/2018  . At risk for adverse drug reaction 06/11/2018  . Critical lower limb ischemia 05/27/2018  . GI bleeding 04/02/2018  . Acute renal failure (ARF) ([place]) 04/02/2018  . Hypokalemia 04/02/2018  . Polysubstance abuse ([place]) 04/02/2018  . Diabetic foot ulcer ([place]) 04/02/2018  . Cannabis use disorder, moderate, dependence ([place]) 05/19/2017  . Cocaine use disorder, moderate, dependence ([place]) 05/19/2017  . Schizoaffective disorder, bipolar type ([place]) 10/07/2015  . OSA (obstructive sleep apnea) 06/09/2015  . Vitamin D deficiency 01/06/2015  . Bilateral knee pain 01/05/2015  . Numbness and tingling in right hand 01/05/2015  . Insomnia 12/07/2014  . Right-sided lacunar infarction ([place]) 09/15/2014  . Left hemiparesis ([place]) 09/15/2014  . Dyspnea   . Back pain 09/11/2013  . Psychoactive substance-induced organic mood disorder ([place]) 05/28/2013  . Cocaine abuse with cocaine-induced mood disorder ([place]) 05/28/2013  . Cannabis dependence with cannabis-induced anxiety disorder ([place]) 05/28/2013  . Morbid obesity  with BMI of 45.0-49.9, adult ([place]) 04/25/2012  . Chest pain 04/17/2012  . Hypertension 04/17/2012  . DM (diabetes mellitus) with peripheral vascular complication ([place]) [id number]  . Hyperlipidemia 04/17/2012  . Tobacco use 04/17/2012    Past Surgical History:  Procedure Laterality Date  . ABDOMINAL AORTOGRAM W/LOWER EXTREMITY Left 05/27/2018    Procedure: ABDOMINAL AORTOGRAM W/LOWER EXTREMITY Runoff and Possible Intervention;  Surgeon: Waynetta Sandy, MD;  Location: [facility];  Service: Cardiovascular;  Laterality: Left;  . APPLICATION OF WOUND VAC Left 06/06/2018   Procedure: APPLICATION OF WOUND VAC;  Surgeon: Waynetta Sandy, MD;  Location: [facility];  Service: Vascular;  Laterality: Left;  . BIOPSY  04/04/2018   Procedure: BIOPSY;  Surgeon: [name], MD;  Location: [facility] ENDOSCOPY;  Service: Endoscopy;;  . CARDIAC CATHETERIZATION  ~ 2011  . COLONOSCOPY N/A 04/04/2018   Procedure: COLONOSCOPY;  Surgeon: [name], MD;  Location: [facility] ENDOSCOPY;  Service: Endoscopy;  Laterality: N/A;  . LOWER EXTREMITY ANGIOGRAM Left 05/27/2018  . PERIPHERAL VASCULAR INTERVENTION Left 05/27/2018   Procedure: PERIPHERAL VASCULAR INTERVENTION;  Surgeon: Waynetta Sandy, MD;  Location: [facility];  Service: Cardiovascular;  Laterality: Left;  . POLYPECTOMY  04/04/2018   Procedure: POLYPECTOMY;  Surgeon: [name], MD;  Location: [facility] ENDOSCOPY;  Service: Endoscopy;;  . TRANSMETATARSAL AMPUTATION Left 05/28/2018   Procedure: AMPUTATION TOES THREE, FOUR AND FIVE on left foot;  Surgeon: Waynetta Sandy, MD;  Location: [facility];  Service: Vascular;  Laterality: Left;  . WOUND DEBRIDEMENT Left 06/06/2018   Procedure: DEBRIDEMENT WOUND LEFT FOOT;  Surgeon: Waynetta Sandy, MD;  Location: [facility];  Service: Vascular;  Laterality: Left;     OB History   No obstetric history on file.     Family History  Problem Relation Age of Onset  . Stroke Brother 64  . Stroke Brother 73  . Heart attack Mother 23  . Stroke Father 51    Social History   Tobacco Use  . Smoking status: Former Smoker    Packs/day: 0.25    Years: 28.00    Pack years: 7.00    Types: Cigarettes  . Smokeless tobacco: Never Used  Substance Use Topics  . Alcohol use: No    Alcohol/week: 0.0 standard drinks    Comment: rare    . Drug use: Yes    Types: Marijuana, "Crack" cocaine    Comment: marijuana last use days ago; last cocaine several months ago    Home Medications Prior to Admission medications   Medication Sig Start Date End Date Taking? Authorizing Provider  ARIPiprazole (ABILIFY) 15 MG tablet Take 1 tablet (15 mg total) by mouth at bedtime. 08/26/18  Yes Medina-Vargas, Monina C, NP  cloNIDine (CATAPRES) 0.3 MG tablet Take 0.3 mg by mouth 2 (two) times daily.   Yes [provider]  ferrous sulfate 325 (65 FE) MG tablet Take 1 tablet (325 mg total) by mouth daily with breakfast. 08/26/18  Yes Medina-Vargas, Monina C, NP  hydrALAZINE (APRESOLINE) 100 MG tablet Take 100 mg by mouth 3 (three) times daily.   Yes [provider]  Insulin Detemir (LEVEMIR) 100 UNIT/ML Pen Inject 15 Units into the skin 2 (two) times daily. 08/26/18  Yes Medina-Vargas, Monina C, NP  lisinopril (ZESTRIL) 40 MG tablet Take 40 mg by mouth daily.   Yes [provider]  pentoxifylline (TRENTAL) 400 MG CR tablet Take 1 tablet (400 mg total) by mouth 3 (three) times daily with meals.  08/26/18  Yes Medina-Vargas, Monina C, NP  aspirin EC 81 MG EC tablet Take 1 tablet (81 mg total) by mouth daily. Patient not taking: Reported on 12/06/2019 09/21/18   Donzetta Starch, NP  carvedilol (COREG) 25 MG tablet Take 1 tablet (25 mg total) by mouth 2 (two) times daily. Patient not taking: Reported on 12/06/2019 08/15/19 09/14/19  Minus Breeding, MD  cloNIDine (CATAPRES - DOSED IN MG/24 HR) 0.3 mg/24hr patch Place 1 patch (0.3 mg total) onto the skin once a week. Patient not taking: Reported on 08/15/2019 08/11/19   Minus Breeding, MD  furosemide (LASIX) 40 MG tablet Take 1 tablet (40 mg total) by mouth daily. Patient not taking: Reported on 12/06/2019 08/26/18   Medina-Vargas, Monina C, NP  gabapentin (NEURONTIN) 100 MG capsule Take 1 capsule (100 mg total) by mouth 2 (two) times daily. Patient not taking: Reported on  08/15/2019 09/20/18   Donzetta Starch, NP  potassium chloride SA (K-DUR,KLOR-CON) 20 MEQ tablet Take 1 tablet (20 mEq total) by mouth daily. Patient not taking: Reported on 12/06/2019 08/26/18   Medina-Vargas, Monina C, NP    Allergies    Morphine and related and Metformin  Review of Systems   Review of Systems  Unable to perform ROS: Acuity of condition    Physical Exam Updated Vital Signs BP (!) 159/93   Pulse 60   Temp 97.9 F (36.6 C) (Oral)   Resp (!) 24   SpO2 100%   Physical Exam Vitals and nursing note reviewed.  Constitutional:      General: She is not in acute distress.    Appearance: Normal appearance. She is normal weight. She is not ill-appearing.  HENT:     Head: Normocephalic.     Right Ear: External ear normal.     Left Ear: External ear normal.     Nose: Nose normal.     Mouth/Throat:     Mouth: Mucous membranes are moist.     Pharynx: Oropharynx is clear.  Eyes:     Extraocular Movements: Extraocular movements intact.  Cardiovascular:     Rate and Rhythm: Normal rate and regular rhythm.     Pulses: Normal pulses.     Heart sounds: Normal heart sounds.  Pulmonary:     Effort: Pulmonary effort is normal. No respiratory distress.     Breath sounds: Normal breath sounds. No wheezing or rhonchi.  Abdominal:     General: Bowel sounds are normal.     Palpations: Abdomen is soft.     Tenderness: There is no abdominal tenderness. There is no guarding.  Musculoskeletal:     Cervical back: Normal range of motion.     Right lower leg: No edema.     Left lower leg: No edema.  Skin:    General: Skin is warm and dry.     Capillary Refill: Capillary refill takes less than 2 seconds.  Neurological:     Mental Status: She is alert. Mental status is at baseline.     Cranial Nerves: Dysarthria present.  Psychiatric:        Mood and Affect: Mood normal.     ED Results / Procedures / Treatments   Labs (all labs ordered are listed, but only abnormal results  are displayed) Labs Reviewed  CBC - Abnormal; Notable for the following components:      Result Value   Hemoglobin 11.8 (*)    MCV 74.5 (*)    MCH 23.3 (*)  RDW 16.2 (*)    All other components within normal limits  COMPREHENSIVE METABOLIC PANEL - Abnormal; Notable for the following components:   Potassium 3.2 (*)    CO2 21 (*)    Glucose, Bld 180 (*)    Creatinine, Ser 1.62 (*)    Calcium 8.8 (*)    Albumin 3.0 (*)    GFR calc non Af Amer 37 (*)    GFR calc Af Amer 43 (*)    All other components within normal limits  I-STAT CHEM 8, ED - Abnormal; Notable for the following components:   Potassium 3.1 (*)    Creatinine, Ser 1.50 (*)    Glucose, Bld 178 (*)    Calcium, Ion 1.09 (*)    All other components within normal limits  CBG MONITORING, ED - Abnormal; Notable for the following components:   Glucose-Capillary 144 (*)    All other components within normal limits  POCT I-STAT 7, (LYTES, BLD GAS, ICA,H+H) - Abnormal; Notable for the following components:   pH, Arterial 7.487 (*)    pO2, Arterial 69.0 (*)    Potassium 3.3 (*)    All other components within normal limits  SARS CORONAVIRUS 2 (TAT 6-24 HRS)  ETHANOL  PROTIME-INR  APTT  DIFFERENTIAL  RAPID URINE DRUG SCREEN, HOSP PERFORMED  URINALYSIS, ROUTINE W REFLEX MICROSCOPIC  HIV ANTIBODY (ROUTINE TESTING W REFLEX)  HEMOGLOBIN A1C  LIPID PANEL  HEMOGLOBIN A1C  MAGNESIUM  BASIC METABOLIC PANEL  I-STAT BETA HCG BLOOD, ED (MC, [facility], AP ONLY)  TROPONIN I (HIGH SENSITIVITY)    EKG None  Radiology MR ANGIO HEAD WO CONTRAST  Result Date: 12/06/2019 CLINICAL DATA:  49 year old female with acute on chronic small vessel disease. EXAM: MRA HEAD WITHOUT CONTRAST TECHNIQUE: Angiographic images of the Circle of Willis were obtained using MRA technique without intravenous contrast. COMPARISON:  Brain MRI today reported separately. Intracranial MRA 09/02/2018. FINDINGS: Chronically diminutive posterior circulation. Antegrade  flow appears stable in diminutive distal vertebral arteries, the left terminates in PICA. The right PICA origin remains patent. The right vertebral supplies a patent but diminutive basilar artery. There is moderate to severe stenosis at the basilar artery origin which may be progressed from last year (series 1038, image 7). Fetal type bilateral PCA origins. The basilar terminates in the SCA is which remain patent. Bilateral fetal type PCA origins. Severe right PCA P1 and left PCA P2 stenoses are redemonstrated and stable. Stable antegrade flow in both ICA siphons. Normal posterior communicating artery origins. Mild to moderate bilateral siphon anterior genu/supraclinoid irregularity appears stable with mild stenosis. Carotid termini remain patent. MCA and ACA origins remain patent but there is new moderate stenosis at the left A1 origin (series 1030, image 10). MCA M1 segments and bifurcations remain patent with mild irregularity. Visible bilateral MCA and ACA branches are stable with mild irregularity. IMPRESSION: 1. Chronically age advanced intracranial atherosclerosis. Negative for large vessel occlusion. 2. Diminutive vertebrobasilar system on the basis of fetal type PCA origins, but evidence of hemodynamically significant stenosis at the Vertebrobasilar Junction which appears progressed from last year. Chronic severe stenoses of the Right P1 and Left P2. 3. Stable mild stenoses of the bilateral ICA anterior genu. New moderate stenosis of the Left ACA origin. Electronically Signed   By: Genevie Ann M.D.   On: 12/06/2019 19:17   MR BRAIN WO CONTRAST  Result Date: 12/06/2019 CLINICAL DATA:  Code stroke. 49 year old female with slurred speech. History of prior stroke. EXAM: MRI HEAD WITHOUT CONTRAST  TECHNIQUE: Multiplanar, multiecho pulse sequences of the brain and surrounding structures were obtained without intravenous contrast. COMPARISON:  Head CT earlier today. Brain MRI 01/14/2018. FINDINGS: Brain: Oval 10 mm  focus of restricted diffusion in the left corona radiata tracking into the posterior left lentiform (series 5, image 73). Faint T2 and FLAIR hyperintensity. No hemorrhage or mass effect. No other restricted diffusion. Small chronic infarct in the deep left cerebellar nuclei is new since 2019. Extensive chronic small vessel infarcts scattered in the bilateral basal ganglia, thalami, right corona radiata. There is Wallerian degeneration in the right brainstem. Scattered chronic microhemorrhages, with abundant hemosiderin in the posterior right lentiform. Additional scattered and confluent bilateral white matter T2 and FLAIR hyperintensity. No definite cortical encephalomalacia. No midline shift, mass effect, evidence of mass lesion, ventriculomegaly, extra-axial collection or acute intracranial hemorrhage. Cervicomedullary junction and pituitary are within normal limits. Vascular: Major intracranial vascular flow voids are stable since 2019. Skull and upper cervical spine: Negative visible cervical spine and bone marrow signal. Sinuses/Orbits: Negative orbits. Paranasal Visualized paranasal sinuses and mastoids are stable and well pneumatized. Other: Visible internal auditory structures appear normal. Scalp and face soft tissues appear negative. IMPRESSION: 1. Small acute lacunar infarct in the left corona radiata tracking into the posterior left lentiform. No associated hemorrhage or mass effect. 2. Underlying very severe chronic small vessel disease. 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 Electronically Signed   By: Genevie Ann M.D.   On: 12/06/2019 19:11   CT HEAD CODE STROKE WO CONTRAST  Result Date: 12/06/2019 CLINICAL DATA:  Code stroke. 49 year old female with slurred speech. History of prior stroke. EXAM: CT HEAD WITHOUT CONTRAST TECHNIQUE: Contiguous axial images were obtained from the base of the skull through the vertex without intravenous contrast. COMPARISON:  Head CT 06/26/2019. Brain  MRI 09/02/2018. FINDINGS: Brain: Advanced chronic cerebral white matter and deep gray nuclei hypodensity. No acute intracranial hemorrhage identified. No midline shift, mass effect, or evidence of intracranial mass lesion. No ventriculomegaly. Small chronic infarct in the left cerebellum. No cortically based acute infarct identified. Vascular: Calcified atherosclerosis at the skull base. No suspicious intracranial vascular hyperdensity. Skull: Stable hyperostosis. No acute osseous abnormality identified. Sinuses/Orbits: Visualized paranasal sinuses and mastoids are stable and well pneumatized. Other: Leftward gaze. Negative scalp soft tissues. ASPECTS [facility] Stroke Program Early CT Score) Total score (0-10 with 10 being normal): 10 IMPRESSION: 1. Stable CT appearance of advanced chronic small vessel disease since October. ASPECTS 10. 2. These results were communicated to Dr. Lorraine Lax at 5:37 pmon 4/10/2021by text page via the [facility] messaging system. Electronically Signed   By: Genevie Ann M.D.   On: 12/06/2019 17:38    Procedures Procedures (including critical care time)  Medications Ordered in ED Medications  ARIPiprazole (ABILIFY) tablet 15 mg (has no administration in time range)  insulin detemir (LEVEMIR) injection 15 Units (has no administration in time range)  ferrous sulfate tablet 325 mg (has no administration in time range)  pentoxifylline (TRENTAL) CR tablet 400 mg (has no administration in time range)   stroke: mapping our early stages of recovery book (has no administration in time range)  acetaminophen (TYLENOL) tablet 650 mg (has no administration in time range)    Or  acetaminophen (TYLENOL) 160 MG/5ML solution 650 mg (has no administration in time range)    Or  acetaminophen (TYLENOL) suppository 650 mg (has no administration in time range)  enoxaparin (LOVENOX) injection 40 mg (has no administration in time range)  0.9 %  sodium chloride  infusion (has no administration in time range)    insulin aspart (novoLOG) injection 0-15 Units (has no administration in time range)  aspirin EC tablet 81 mg (has no administration in time range)  clopidogrel (PLAVIX) tablet 75 mg (has no administration in time range)  atorvastatin (LIPITOR) tablet 40 mg (has no administration in time range)  potassium chloride SA (KLOR-CON) CR tablet 40 mEq (has no administration in time range)  ipratropium-albuterol (DUONEB) 0.5-2.5 (3) MG/3ML nebulizer solution 3 mL (has no administration in time range)    ED Course  I have reviewed the triage vital signs and the nursing notes.  Pertinent labs & imaging results that were available during my care of the patient were reviewed by me and considered in my medical decision making (see chart for details).    MDM Rules/Calculators/A&P                      49 year old female with a past medical history of previous hemorrhagic stroke with residual left-sided weakness, COPD not on oxygen at baseline, HTN, hypercholesterolemia obesity, sleep apnea, type 2 diabetes presenting to the emergency department brought in by EMS as a code stroke with concern for sudden onset of slurred speech the patient noticed at approximately 12:00 this afternoon.   ABCs intact, vital signs stable upon arrival.  Patient transported immediately to the CT scanner for code stroke imaging.  Neurology at bedside in the [facility].  On initial exam the patient appeared to have slurred speech and left-sided weakness.  DDX considered include acute stroke, hemorrhagic versus ischemic, toxic metabolic, recrudescence,   ECG interpreted by me demonstrated Sinus rhythm at 74 bpm, nml axis, QTc 457 ms, otherwise nml intervals, old anterior infarct, T inversios in the lateral leads which appear new compared to previous on 02/11/2019  Labs demonstrated i-STAT chemistry demonstrated mild hypokalemia to 3.1, creatinine elevation of 1.5, BUN 17, creatinine elevated from 1.31 previously, negative ethanol,  coags wnl   Emergent MRI brain demonstrated an acute basilar infarct concerning for acute stroke. Neurology recommends admission for stroke workup.   We will admit to hospitalist for ongoing evaluation, monitoring and management  The plan for this patient was discussed with Dr. Darl Householder, who voiced agreement and who oversaw evaluation and treatment of this patient.  Final Clinical Impression(s) / ED Diagnoses Final diagnoses:  Cerebrovascular accident (CVA), unspecified mechanism [facility])    Rx / Rew Orders ED Discharge Orders    None       Filbert Berthold, MD 12/06/19 [id number]    Drenda Freeze, MD 12/07/19 [phone]

## 2019-12-06 NOTE — H&P (Signed)
History and Physical    Diamond Rice [id number] DOB: [birth year] DOA: 12/06/2019  PCP: [name], MD Patient coming from: Home  Chief Complaint: Code stroke  HPI: Diamond Rice is a 49 y.o. female with medical history significant of hemorrhagic stroke in January 2020 with residual left-sided weakness, COPD, hypertension, hyperlipidemia, insulin-dependent type 2 diabetes, OSA on CPAP, anxiety, depression, substance abuse (marijuana, crack/cocaine) presenting with complaints of slurred speech.  Patient reports acute onset slurring of speech starting 4:20 PM today.  Denies any headaches, blurry vision, or focal numbness.  She has residual left-sided weakness from her prior stroke and does not report any new change.  No other complaints.  Denies fevers, chills, cough, shortness of breath, chest pain, nausea, vomiting, abdominal pain, or diarrhea.  ED Course: Afebrile.  Blood pressure slightly elevated.  No leukocytosis.  Hemoglobin 11.8, no significant change from baseline.  Potassium 3.2.  Bicarb 21, anion gap 12.  Blood glucose 180.  ABG with pH 7.48, PCO2 32, and PO2 69.  Creatinine 1.6, previously ranging between 1.0-1.3.  UA and UDS pending.  Blood ethanol level undetectable.  Beta-hCG negative.  SARS-CoV-2 PCR test pending.   Patient was seen by neurology and NIHSS 5.  TPA not given her LKW and hemorrhagic stroke history.  Exam was concerning for posterior circulation stroke given week cough/gag and drowsy state.  Head CT negative for acute finding.  MRI brain showing a small acute lacunar infarct in the left corona radiata tracking into the posterior left lentiform.  No associated hemorrhage or mass-effect.  MRA head negative for LVO.  Admitted for stroke work-up.  Review of Systems:  All systems reviewed and apart from history of presenting illness, are negative.  Past Medical History:  Diagnosis Date  . Anxiety   . Chronic lower back pain 04/17/2012   "just got over some;  catched when I walked"  . COPD (chronic obstructive pulmonary disease) ([place])   . Critical lower limb ischemia 05/27/2018  . Depression   . Headache(784.0) 04/17/2012   "~ qod; lately waking up in am w/one"  . High cholesterol   . Hypertension   . Migraines 04/17/2012  . Obesity   . Sleep apnea    CPAP  . Stroke ([place])   . Type II diabetes mellitus ([place]) 08/28/2002    Past Surgical History:  Procedure Laterality Date  . ABDOMINAL AORTOGRAM W/LOWER EXTREMITY Left 05/27/2018   Procedure: ABDOMINAL AORTOGRAM W/LOWER EXTREMITY Runoff and Possible Intervention;  Surgeon: Waynetta Sandy, MD;  Location: [place] [facility];  Service: Cardiovascular;  Laterality: Left;  . APPLICATION OF WOUND VAC Left 06/06/2018   Procedure: APPLICATION OF WOUND VAC;  Surgeon: Waynetta Sandy, MD;  Location: [place];  Service: Vascular;  Laterality: Left;  . BIOPSY  04/04/2018   Procedure: BIOPSY;  Surgeon: [name], MD;  Location[facility] ENDOSCOPY;  Service: Endoscopy;;  . CARDIAC CATHETERIZATION  ~ 2011  . COLONOSCOPY N/A 04/04/2018   Procedure: COLONOSCOPY;  Surgeon: [name], MD;  Location: [facility] ENDOSCOPY;  Service: Endoscopy;  Laterality: N/A;  . LOWER EXTREMITY ANGIOGRAM Left 05/27/2018  . PERIPHERAL VASCULAR INTERVENTION Left 05/27/2018   Procedure: PERIPHERAL VASCULAR INTERVENTION;  Surgeon: Waynetta Sandy, MD;  Location: [place] [facility];  Service: Cardiovascular;  Laterality: Left;  . POLYPECTOMY  04/04/2018   Procedure: POLYPECTOMY;  Surgeon: [name], MD;  Location: [facility] ENDOSCOPY;  Service: Endoscopy;;  . TRANSMETATARSAL AMPUTATION Left 05/28/2018   Procedure: AMPUTATION TOES THREE, FOUR AND  FIVE on left foot;  Surgeon: Waynetta Sandy, MD;  Location: [place];  Service: Vascular;  Laterality: Left;  . WOUND DEBRIDEMENT Left 06/06/2018   Procedure: DEBRIDEMENT WOUND LEFT FOOT;  Surgeon: Waynetta Sandy, MD;  Location: [place];  Service: Vascular;   Laterality: Left;     reports that she has quit smoking. Her smoking use included cigarettes. She has a 7.00 pack-year smoking history. She has never used smokeless tobacco. She reports current drug use. Drugs: Marijuana and "Crack" cocaine. She reports that she does not drink alcohol.  Allergies  Allergen Reactions  . Morphine And Related Hives  . Metformin Diarrhea    Family History  Problem Relation Age of Onset  . Stroke Brother 40  . Stroke Brother 4  . Heart attack Mother 87  . Stroke Father 40    Prior to Admission medications   Medication Sig Start Date End Date Taking? Authorizing Provider  ARIPiprazole (ABILIFY) 15 MG tablet Take 1 tablet (15 mg total) by mouth at bedtime. 08/26/18  Yes Medina-Vargas, Monina C, NP  cloNIDine (CATAPRES) 0.3 MG tablet Take 0.3 mg by mouth 2 (two) times daily.   Yes [provider]  ferrous sulfate 325 (65 FE) MG tablet Take 1 tablet (325 mg total) by mouth daily with breakfast. 08/26/18  Yes Medina-Vargas, Monina C, NP  hydrALAZINE (APRESOLINE) 100 MG tablet Take 100 mg by mouth 3 (three) times daily.   Yes [provider]  Insulin Detemir (LEVEMIR) 100 UNIT/ML Pen Inject 15 Units into the skin 2 (two) times daily. 08/26/18  Yes Medina-Vargas, Monina C, NP  lisinopril (ZESTRIL) 40 MG tablet Take 40 mg by mouth daily.   Yes [provider]  pentoxifylline (TRENTAL) 400 MG CR tablet Take 1 tablet (400 mg total) by mouth 3 (three) times daily with meals. 08/26/18  Yes Medina-Vargas, Monina C, NP  aspirin EC 81 MG EC tablet Take 1 tablet (81 mg total) by mouth daily. Patient not taking: Reported on 12/06/2019 09/21/18   Donzetta Starch, NP  carvedilol (COREG) 25 MG tablet Take 1 tablet (25 mg total) by mouth 2 (two) times daily. Patient not taking: Reported on 12/06/2019 08/15/19 09/14/19  Minus Breeding, MD  cloNIDine (CATAPRES - DOSED IN MG/24 HR) 0.3 mg/24hr patch Place 1 patch (0.3 mg total) onto the skin once a  week. Patient not taking: Reported on 08/15/2019 08/11/19   Minus Breeding, MD  furosemide (LASIX) 40 MG tablet Take 1 tablet (40 mg total) by mouth daily. Patient not taking: Reported on 12/06/2019 08/26/18   Medina-Vargas, Monina C, NP  gabapentin (NEURONTIN) 100 MG capsule Take 1 capsule (100 mg total) by mouth 2 (two) times daily. Patient not taking: Reported on 08/15/2019 09/20/18   Donzetta Starch, NP  potassium chloride SA (K-DUR,KLOR-CON) 20 MEQ tablet Take 1 tablet (20 mEq total) by mouth daily. Patient not taking: Reported on 12/06/2019 08/26/18   Nickola Major, NP    Physical Exam: Vitals:   12/06/19 2200 12/06/19 2215 12/06/19 2230 12/06/19 2300  BP: (!) 158/96 (!) 162/90 (!) 158/96 (!) 159/93  Pulse: 61 (!) 57 62 60  Resp:  (!) 23 (!) 25 (!) 24  Temp:      TempSrc:      SpO2: 99% 99% 100% 100%    Physical Exam  Constitutional: She is oriented to person, place, and time. She appears well-developed and well-nourished. No distress.  HENT:  Head: Normocephalic.  Eyes: EOM are normal. Right eye  exhibits no discharge. Left eye exhibits no discharge.  Cardiovascular: Normal rate, regular rhythm and intact distal pulses.  Pulmonary/Chest: Effort normal and breath sounds normal. No respiratory distress. She has no wheezes. She has no rales.  Abdominal: Soft. Bowel sounds are normal. She exhibits no distension. There is no abdominal tenderness. There is no guarding.  Musculoskeletal:        General: No edema.     Cervical back: Neck supple.  Neurological: She is alert and oriented to person, place, and time.  Dysarthric No obvious facial droop and tongue midline Strength 4 out of 5 in the left upper and lower extremity Strength 5 out of 5 in the right upper and lower extremity Sensation to light touch intact throughout.  Skin: Skin is warm and dry. She is not diaphoretic.     Labs on Admission: I have personally reviewed following labs and imaging  studies  CBC: Recent Labs  Lab 12/06/19 1726 12/06/19 1728 12/06/19 1802  WBC  --  5.3  --   NEUTROABS  --  2.6  --   HGB 12.9 11.8* 12.6  HCT 38.0 37.7 37.0  MCV  --  74.5*  --   PLT  --  273  --    Basic Metabolic Panel: Recent Labs  Lab 12/06/19 1726 12/06/19 1728 12/06/19 1802  NA 141 140 143  K 3.1* 3.2* 3.3*  CL 106 107  --   CO2  --  21*  --   GLUCOSE 178* 180*  --   BUN 17 17  --   CREATININE 1.50* 1.62*  --   CALCIUM  --  8.8*  --    GFR: CrCl cannot be calculated (Unknown ideal weight.). Liver Function Tests: Recent Labs  Lab 12/06/19 1728  AST 17  ALT 8  ALKPHOS 49  BILITOT 0.4  PROT 7.0  ALBUMIN 3.0*   No results for input(s): LIPASE, AMYLASE in the last 168 hours. No results for input(s): AMMONIA in the last 168 hours. Coagulation Profile: Recent Labs  Lab 12/06/19 1728  INR 1.0   Cardiac Enzymes: No results for input(s): CKTOTAL, CKMB, CKMBINDEX, TROPONINI in the last 168 hours. BNP (last 3 results) No results for input(s): PROBNP in the last 8760 hours. HbA1C: No results for input(s): HGBA1C in the last 72 hours. CBG: Recent Labs  Lab 12/06/19 1730  GLUCAP 144*   Lipid Profile: No results for input(s): CHOL, HDL, LDLCALC, TRIG, CHOLHDL, LDLDIRECT in the last 72 hours. Thyroid Function Tests: No results for input(s): TSH, T4TOTAL, FREET4, T3FREE, THYROIDAB in the last 72 hours. Anemia Panel: No results for input(s): VITAMINB12, FOLATE, FERRITIN, TIBC, IRON, RETICCTPCT in the last 72 hours. Urine analysis:    Component Value Date/Time   COLORURINE YELLOW 06/26/2019 2022   APPEARANCEUR CLEAR 06/26/2019 2022   LABSPEC 1.018 06/26/2019 2022   PHURINE 5.0 06/26/2019 2022   GLUCOSEU NEGATIVE 06/26/2019 2022   HGBUR NEGATIVE 06/26/2019 2022   [place] NEGATIVE 06/26/2019 2022   [place] 06/26/2019 2022   PROTEINUR 30 (A) 06/26/2019 2022   UROBILINOGEN 0.2 08/28/2014 1143   NITRITE NEGATIVE 06/26/2019 2022    LEUKOCYTESUR NEGATIVE 06/26/2019 2022    Radiological Exams on Admission: MR ANGIO HEAD WO CONTRAST  Result Date: 12/06/2019 CLINICAL DATA:  49 year old female with acute on chronic small vessel disease. EXAM: MRA HEAD WITHOUT CONTRAST TECHNIQUE: Angiographic images of the Circle of Willis were obtained using MRA technique without intravenous contrast. COMPARISON:  Brain MRI today reported separately. Intracranial  MRA 09/02/2018. FINDINGS: Chronically diminutive posterior circulation. Antegrade flow appears stable in diminutive distal vertebral arteries, the left terminates in PICA. The right PICA origin remains patent. The right vertebral supplies a patent but diminutive basilar artery. There is moderate to severe stenosis at the basilar artery origin which may be progressed from last year (series 1038, image 7). Fetal type bilateral PCA origins. The basilar terminates in the SCA is which remain patent. Bilateral fetal type PCA origins. Severe right PCA P1 and left PCA P2 stenoses are redemonstrated and stable. Stable antegrade flow in both ICA siphons. Normal posterior communicating artery origins. Mild to moderate bilateral siphon anterior genu/supraclinoid irregularity appears stable with mild stenosis. Carotid termini remain patent. MCA and ACA origins remain patent but there is new moderate stenosis at the left A1 origin (series 1030, image 10). MCA M1 segments and bifurcations remain patent with mild irregularity. Visible bilateral MCA and ACA branches are stable with mild irregularity. IMPRESSION: 1. Chronically age advanced intracranial atherosclerosis. Negative for large vessel occlusion. 2. Diminutive vertebrobasilar system on the basis of fetal type PCA origins, but evidence of hemodynamically significant stenosis at the Vertebrobasilar Junction which appears progressed from last year. Chronic severe stenoses of the Right P1 and Left P2. 3. Stable mild stenoses of the bilateral ICA anterior genu.  New moderate stenosis of the Left ACA origin. Electronically Signed   By: Genevie Ann M.D.   On: 12/06/2019 19:17   MR BRAIN WO CONTRAST  Result Date: 12/06/2019 CLINICAL DATA:  Code stroke. 49 year old female with slurred speech. History of prior stroke. EXAM: MRI HEAD WITHOUT CONTRAST TECHNIQUE: Multiplanar, multiecho pulse sequences of the brain and surrounding structures were obtained without intravenous contrast. COMPARISON:  Head CT earlier today. Brain MRI 01/14/2018. FINDINGS: Brain: Oval 10 mm focus of restricted diffusion in the left corona radiata tracking into the posterior left lentiform (series 5, image 73). Faint T2 and FLAIR hyperintensity. No hemorrhage or mass effect. No other restricted diffusion. Small chronic infarct in the deep left cerebellar nuclei is new since 2019. Extensive chronic small vessel infarcts scattered in the bilateral basal ganglia, thalami, right corona radiata. There is Wallerian degeneration in the right brainstem. Scattered chronic microhemorrhages, with abundant hemosiderin in the posterior right lentiform. Additional scattered and confluent bilateral white matter T2 and FLAIR hyperintensity. No definite cortical encephalomalacia. No midline shift, mass effect, evidence of mass lesion, ventriculomegaly, extra-axial collection or acute intracranial hemorrhage. Cervicomedullary junction and pituitary are within normal limits. Vascular: Major intracranial vascular flow voids are stable since 2019. Skull and upper cervical spine: Negative visible cervical spine and bone marrow signal. Sinuses/Orbits: Negative orbits. Paranasal Visualized paranasal sinuses and mastoids are stable and well pneumatized. Other: Visible internal auditory structures appear normal. Scalp and face soft tissues appear negative. IMPRESSION: 1. Small acute lacunar infarct in the left corona radiata tracking into the posterior left lentiform. No associated hemorrhage or mass effect. 2. Underlying very  severe chronic small vessel disease. 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 Electronically Signed   By: Genevie Ann M.D.   On: 12/06/2019 19:11   CT HEAD CODE STROKE WO CONTRAST  Result Date: 12/06/2019 CLINICAL DATA:  Code stroke. 49 year old female with slurred speech. History of prior stroke. EXAM: CT HEAD WITHOUT CONTRAST TECHNIQUE: Contiguous axial images were obtained from the base of the skull through the vertex without intravenous contrast. COMPARISON:  Head CT 06/26/2019. Brain MRI 09/02/2018. FINDINGS: Brain: Advanced chronic cerebral white matter and deep gray nuclei hypodensity. No acute intracranial  hemorrhage identified. No midline shift, mass effect, or evidence of intracranial mass lesion. No ventriculomegaly. Small chronic infarct in the left cerebellum. No cortically based acute infarct identified. Vascular: Calcified atherosclerosis at the skull base. No suspicious intracranial vascular hyperdensity. Skull: Stable hyperostosis. No acute osseous abnormality identified. Sinuses/Orbits: Visualized paranasal sinuses and mastoids are stable and well pneumatized. Other: Leftward gaze. Negative scalp soft tissues. ASPECTS [facility] Stroke Program Early CT Score) Total score (0-10 with 10 being normal): 10 IMPRESSION: 1. Stable CT appearance of advanced chronic small vessel disease since October. ASPECTS 10. 2. These results were communicated to Dr. Lorraine Lax at 5:37 pmon 4/10/2021by text page via the [facility] messaging system. Electronically Signed   By: Genevie Ann M.D.   On: 12/06/2019 17:38    EKG: Independently reviewed.  Sinus rhythm.  T wave abnormality in inferior and lateral leads appears new compared to prior tracing.  Assessment/Plan Principal Problem:   Acute CVA (cerebrovascular accident) ([place]) Active Problems:   OSA (obstructive sleep apnea)   Hypokalemia   Type 2 diabetes mellitus ([place])   AKI (acute kidney injury) ([place])   Acute ischemic stroke: Patient was seen by  neurology and NIHSS 5.  TPA not given her LKW and hemorrhagic stroke history.  Exam was concerning for posterior circulation stroke given week cough/gag and drowsy state.  Head CT negative for acute finding.  MRI brain showing a small acute lacunar infarct in the left corona radiata tracking into the posterior left lentiform.  No associated hemorrhage or mass-effect.  MRA head negative for LVO.  Admitted for stroke work-up. -Telemetry monitoring -Allow for permissive hypertension up to 220/120 -2D echocardiogram -Hemoglobin A1c, fasting lipid panel -Start aspirin 81 mg daily and Plavix 75 mg daily x3 weeks per neurology recommendations -Lipitor 40 mg daily -Frequent neurochecks -PT, OT, speech therapy. -N.p.o. until cleared by bedside swallow evaluation or formal speech evaluation -Neurology following  Mild hypokalemia: Likely related to home diuretic use.  Replete potassium.  Check magnesium level and replete if low.  Continue to monitor electrolytes.  AKI: Likely related to home diuretic and ACE inhibitor use.  Creatinine 1.6, previously ranging between 1.0-1.3. Give gentle IV fluid hydration.  Hold diuretic and ACE inhibitor.  Avoid nephrotoxic agents.  Monitor urine output.  Repeat BMP in a.m.  Mild normal anion gap metabolic acidosis: Likely related to AKI.  Continue gentle IV fluid hydration and repeat BMP in a.m.  Mild hypoxia: Suspect related to underlying COPD and OSA.  ABG with PO2 69.  No signs of respiratory distress and lungs clear on exam.  Satting 99 to 100% on room air.  Order continuous pulse ox, supplemental oxygen if needed.  COPD: Stable.  No signs of acute exacerbation at this time.  Not on any home inhalers.  Order DuoNebs as needed.  OSA: CPAP at night  Insulin-dependent type 2 diabetes: Check A1c.  Continue home basal insulin.  Order sliding scale insulin with meals.  Abnormal EKG: T wave abnormality in inferior and lateral leads appears new compared to prior  tracing.  Patient denies chest pain and appears comfortable.  Continue cardiac monitoring and check high-sensitivity troponin level.  Order echocardiogram.  History of substance abuse: UDS pending  DVT prophylaxis: Lovenox Code Status: Full code Family Communication: No family available at this time. Disposition Plan: Anticipate discharge after stroke work-up is completed. Consults called: Neurology Admission status: It is my clinical opinion that admission to INPATIENT is reasonable and necessary because of the expectation that this patient will  require hospital care that crosses at least 2 midnights to treat this condition based on the medical complexity of the problems presented.  Given the aforementioned information, the predictability of an adverse outcome is felt to be significant.  The medical decision making on this patient was of high complexity and the patient is at high risk for clinical deterioration, therefore this is a level 3 visit.  Shela Leff MD [facility]  If 7PM-7AM, please contact night-coverage [email or web address]  12/06/2019, 11:18 PM

## 2019-12-06 NOTE — ED Notes (Signed)
RN updated pt Daughter, Levon Hedger on pt current status.

## 2019-12-06 NOTE — ED Notes (Signed)
[phone] Mills Koller (cousin) would like an update on pt status

## 2019-12-07 ENCOUNTER — Inpatient Hospital Stay (HOSPITAL_COMMUNITY): Payer: Medicare Other

## 2019-12-07 DIAGNOSIS — I350 Nonrheumatic aortic (valve) stenosis: Secondary | ICD-10-CM

## 2019-12-07 DIAGNOSIS — G4733 Obstructive sleep apnea (adult) (pediatric): Secondary | ICD-10-CM

## 2019-12-07 DIAGNOSIS — I639 Cerebral infarction, unspecified: Secondary | ICD-10-CM

## 2019-12-07 DIAGNOSIS — I351 Nonrheumatic aortic (valve) insufficiency: Secondary | ICD-10-CM

## 2019-12-07 LAB — URINALYSIS, ROUTINE W REFLEX MICROSCOPIC
Bilirubin Urine: NEGATIVE
Glucose, UA: 50 mg/dL — AB
Hgb urine dipstick: NEGATIVE
Ketones, ur: 5 mg/dL — AB
Leukocytes,Ua: NEGATIVE
Nitrite: NEGATIVE
Protein, ur: 30 mg/dL — AB
Specific Gravity, Urine: 1.023 (ref 1.005–1.030)
pH: 5 (ref 5.0–8.0)

## 2019-12-07 LAB — SARS CORONAVIRUS 2 (TAT 6-24 HRS): SARS Coronavirus 2: NEGATIVE

## 2019-12-07 LAB — LIPID PANEL
Cholesterol: 158 mg/dL (ref 0–200)
HDL: 45 mg/dL (ref >40)
LDL Cholesterol: 95 mg/dL (ref 0–99)
Total CHOL/HDL Ratio: 3.5 RATIO
Triglycerides: 89 mg/dL (ref <150)
VLDL: 18 mg/dL (ref 0–40)

## 2019-12-07 LAB — CBG MONITORING, ED
Glucose-Capillary: 110 mg/dL — ABNORMAL HIGH (ref 70–99)
Glucose-Capillary: 103 mg/dL — ABNORMAL HIGH (ref 70–99)

## 2019-12-07 LAB — BASIC METABOLIC PANEL
Anion gap: 11 (ref 5–15)
BUN: 15 mg/dL (ref 6–20)
CO2: 22 mmol/L (ref 22–32)
Calcium: 8.7 mg/dL — ABNORMAL LOW (ref 8.9–10.3)
Chloride: 111 mmol/L (ref 98–111)
Creatinine, Ser: 1.31 mg/dL — ABNORMAL HIGH (ref 0.44–1.00)
GFR calc Af Amer: 56 mL/min — ABNORMAL LOW (ref >60)
GFR calc non Af Amer: 48 mL/min — ABNORMAL LOW (ref >60)
Glucose, Bld: 104 mg/dL — ABNORMAL HIGH (ref 70–99)
Potassium: 3.5 mmol/L (ref 3.5–5.1)
Sodium: 144 mmol/L (ref 135–145)

## 2019-12-07 LAB — TROPONIN I (HIGH SENSITIVITY): Troponin I (High Sensitivity): <2 ng/L (ref <18)

## 2019-12-07 LAB — RAPID URINE DRUG SCREEN, HOSP PERFORMED
Amphetamines: NOT DETECTED
Barbiturates: NOT DETECTED
Benzodiazepines: NOT DETECTED
Cocaine: POSITIVE — AB
Opiates: NOT DETECTED
Tetrahydrocannabinol: POSITIVE — AB

## 2019-12-07 LAB — HIV ANTIBODY (ROUTINE TESTING W REFLEX): HIV Screen 4th Generation wRfx: NONREACTIVE

## 2019-12-07 LAB — MAGNESIUM: Magnesium: 2.1 mg/dL (ref 1.7–2.4)

## 2019-12-07 LAB — GLUCOSE, CAPILLARY: Glucose-Capillary: 97 mg/dL (ref 70–99)

## 2019-12-07 LAB — ECHOCARDIOGRAM COMPLETE

## 2019-12-07 MED ORDER — CHLORHEXIDINE GLUCONATE 0.12 % MT SOLN
15.0000 mL | Freq: Two times a day (BID) | OROMUCOSAL | Status: DC
  Administered 2019-12-07 – 2019-12-10 (×6): 15 mL via OROMUCOSAL
  Filled 2019-12-07 (×6): qty 15

## 2019-12-07 MED ORDER — ORAL CARE MOUTH RINSE
15.0000 mL | Freq: Two times a day (BID) | OROMUCOSAL | Status: DC
  Administered 2019-12-08 – 2019-12-10 (×3): 15 mL via OROMUCOSAL

## 2019-12-07 NOTE — Evaluation (Signed)
Clinical/Bedside Swallow Evaluation Patient Details  Name: Diamond Rice MRN: [id number] Date of Birth: 01-04-1971  Today's Date: 12/07/2019 Time: SLP Start Time (ACUTE ONLY): [id number] SLP Stop Time (ACUTE ONLY): 1532 SLP Time Calculation (min) (ACUTE ONLY): 15 min  Past Medical History:  Past Medical History:  Diagnosis Date  . Anxiety   . Chronic lower back pain 04/17/2012   "just got over some; catched when I walked"  . COPD (chronic obstructive pulmonary disease) ([place])   . Critical lower limb ischemia 05/27/2018  . Depression   . Headache(784.0) 04/17/2012   "~ qod; lately waking up in am w/one"  . High cholesterol   . Hypertension   . Migraines 04/17/2012  . Obesity   . Sleep apnea    CPAP  . Stroke ([place])   . Type II diabetes mellitus ([place]) 08/28/2002   Past Surgical History:  Past Surgical History:  Procedure Laterality Date  . ABDOMINAL AORTOGRAM W/LOWER EXTREMITY Left 05/27/2018   Procedure: ABDOMINAL AORTOGRAM W/LOWER EXTREMITY Runoff and Possible Intervention;  Surgeon: Waynetta Sandy, MD;  Location: [facility];  Service: Cardiovascular;  Laterality: Left;  . APPLICATION OF WOUND VAC Left 06/06/2018   Procedure: APPLICATION OF WOUND VAC;  Surgeon: Waynetta Sandy, MD;  Location: [facility];  Service: Vascular;  Laterality: Left;  . BIOPSY  04/04/2018   Procedure: BIOPSY;  Surgeon: [name], MD;  Location: [facility] ENDOSCOPY;  Service: Endoscopy;;  . CARDIAC CATHETERIZATION  ~ 2011  . COLONOSCOPY N/A 04/04/2018   Procedure: COLONOSCOPY;  Surgeon: [name], MD;  Location: [facility] ENDOSCOPY;  Service: Endoscopy;  Laterality: N/A;  . LOWER EXTREMITY ANGIOGRAM Left 05/27/2018  . PERIPHERAL VASCULAR INTERVENTION Left 05/27/2018   Procedure: PERIPHERAL VASCULAR INTERVENTION;  Surgeon: Waynetta Sandy, MD;  Location: [facility];  Service: Cardiovascular;  Laterality: Left;  . POLYPECTOMY  04/04/2018   Procedure: POLYPECTOMY;  Surgeon: [name], MD;  Location: [facility] ENDOSCOPY;  Service: Endoscopy;;  . TRANSMETATARSAL AMPUTATION Left 05/28/2018   Procedure: AMPUTATION TOES THREE, FOUR AND FIVE on left foot;  Surgeon: Waynetta Sandy, MD;  Location: [place];  Service: Vascular;  Laterality: Left;  . WOUND DEBRIDEMENT Left 06/06/2018   Procedure: DEBRIDEMENT WOUND LEFT FOOT;  Surgeon: Waynetta Sandy, MD;  Location: [facility] OR;  Service: Vascular;  Laterality: Left;   HPI:  49 year old female with history of hemorrhagic stroke in 10/07/2018 with residual left sided weakness, COPD, hypertension, hyperlipidemia, diabetes mellitus type 2, OSA on CPAP, anxiety, depression, substance abuse (marijuana, crack cocaine) presented with slurred speech. MRI revealed acute small lacunar infarct in left corona radiata. BSE 1/21020 rec'd Dys 3, thin. MBS 09/18/18 oropharyngeal swallow WFL. Espohageal retention mid esophagus and w/u rec'd   Assessment / Plan / Recommendation Clinical Impression  Pt was seen Jan 2020 for MBS with normal oropharyngeal function and suspected esophagal dysfunction with subjective observance of mid-distal esophageal stasis. Today she has been admitted with an acute left corona radiata infarct and demonstrating significant dysarthria marked by reduced intensity and altered nasality (hypernasal? will see for Speech eval) . Unable to develop adequate glottal pressure for volitional cough. Reflexively, there were mild throat clears (immediate/delayed) and delayed cough which suspect are diminished. Swallows were consistently audible suspicious for dyscoordination. MBS recommended; NPO except for crushed meds, single sips water, ice chip after thin water (only) after oral care.     SLP Visit Diagnosis: Dysphagia, unspecified (R13.10)    Aspiration Risk  Moderate aspiration risk  Diet Recommendation NPO except meds;Other (Comment)(meds crushed in puree, single sips H2O only, ice)   Medication Administration: Crushed  with puree    Other  Recommendations Oral Care Recommendations: Oral care QID   Follow up Recommendations Skilled Nursing facility      Frequency and Duration min 2x/week  2 weeks       Prognosis Prognosis for Safe Diet Advancement: Good      Swallow Study   General HPI: 49 year old female with history of hemorrhagic stroke in 10/07/2018 with residual left sided weakness, COPD, hypertension, hyperlipidemia, diabetes mellitus type 2, OSA on CPAP, anxiety, depression, substance abuse (marijuana, crack cocaine) presented with slurred speech. MRI revealed acute small lacunar infarct in left corona radiata. BSE 1/21020 rec'd Dys 3, thin. MBS 09/18/18 oropharyngeal swallow WFL. Espohageal retention mid esophagus and w/u rec'd Type of Study: Bedside Swallow Evaluation Previous Swallow Assessment: (See HPI) Diet Prior to this Study: NPO Temperature Spikes Noted: No Respiratory Status: Room air History of Recent Intubation: No Behavior/Cognition: Alert;Pleasant mood;Cooperative Oral Cavity Assessment: Other (comment)(lingual candidias, halitotosis) Oral Care Completed by SLP: No Oral Cavity - Dentition: (will assess further) Vision: Functional for self-feeding Self-Feeding Abilities: Able to feed self Patient Positioning: Upright in bed Baseline Vocal Quality: Low vocal intensity(?hypernasal) Volitional Cough: Other (Comment)(unable to cough or clear throat)    Oral/Motor/Sensory Function Overall Oral Motor/Sensory Function: Generalized oral weakness   Ice Chips Ice chips: Not tested   Thin Liquid Thin Liquid: Impaired Presentation: Cup;Straw Oral Phase Impairments: (WFL) Pharyngeal  Phase Impairments: Suspected delayed Swallow;Other (comments);Throat Clearing - Immediate;Throat Clearing - Delayed;Cough - Delayed(audible)    Nectar Thick Nectar Thick Liquid: Not tested   Honey Thick Honey Thick Liquid: Not tested   Puree Puree: Impaired Presentation: Spoon;Self Fed Oral Phase  Impairments: (WFL) Pharyngeal Phase Impairments: Other (comments)(audible swallow)   Solid     Solid: Not tested      Houston Siren 12/07/2019,4:01 PM  Orbie Pyo Colvin Caroli.Ed Risk analyst [phone] Office [phone]

## 2019-12-07 NOTE — ED Notes (Signed)
Pt ambulated to restroom with assistance x1.  Pt stated that her unsteady gait was due to previous stroke and is normal for her

## 2019-12-07 NOTE — ED Notes (Addendum)
Dr. Marlowe Sax at bedside. RN informed her that pt was not passed on her swallow screen and PO medications will be held. Dr. Marlowe Sax states speech will consult in the morning.

## 2019-12-07 NOTE — Evaluation (Signed)
Physical Therapy Evaluation Patient Details Name: Diamond Rice MRN: [id number] DOB: 09-01-1970 Today's Date: 12/07/2019   History of Present Illness  Diamond Rice is an 49 y.o. female with PMH hemorrhagic stroke in January 2020 ( residual left side weakness), COPD ( not on baseline oxygen, HTN, HLD, and DM2, who presented to [facility] ED as a code stroke for slurred speech. Small acute lacunar infarct in the left corona radiata tracking into the posterior left lentiform, and Underlying very severe chronic small vessel disease. 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   Clinical Impression   Pt admitted with above diagnosis. Comes from home; Noted in chart review she had residual deficits L side from last year's stroke; Presents to PT with decr functional mobility, decr balance, incr fall risk; Will need more information re: her PLOF and exactly how much assist she has at home;  Pt currently with functional limitations due to the deficits listed below (see PT Problem List). Pt will benefit from skilled PT to increase their independence and safety with mobility to allow discharge to the venue listed below.       Follow Up Recommendations CIR    Equipment Recommendations  Other (comment)(TBD)    Recommendations for Other Services       Precautions / Restrictions Precautions Precautions: Fall      Mobility  Bed Mobility Overal bed mobility: Needs Assistance Bed Mobility: Rolling;Sidelying to Sit;Sit to Supine Rolling: Min guard Sidelying to sit: Min assist   Sit to supine: Mod assist   General bed mobility comments: min handheld assist to come to sit; mod assist to help LEs back into bed  Transfers Overall transfer level: Needs assistance Equipment used: 1 person hand held assist Transfers: Sit to/from Stand;Stand Pivot Transfers Sit to Stand: Mod assist Stand pivot transfers: Mod assist       General transfer comment: Light mod assist to rise and steady;  took a few pivotal steps bed to [facility] and back  Ambulation/Gait                Stairs            Wheelchair Mobility    Modified Rankin (Stroke Patients Only) Modified Rankin (Stroke Patients Only) Pre-Morbid Rankin Score: Slight disability Modified Rankin: Moderately severe disability     Balance Overall balance assessment: Needs assistance   Sitting balance-Leahy Scale: Fair       Standing balance-Leahy Scale: Poor(approaching Fair)                               Pertinent Vitals/Pain Pain Assessment: No/denies pain    Home Living Family/patient expects to be discharged to:: Private residence Living Arrangements: Other (Comment)(need more info; as of last year, living with [facility]) Available Help at Discharge: Friend(s) Type of Home: Apartment Home Access: Level entry     Home Layout: One level Home Equipment: Cane - quad      Prior Function Level of Independence: Needs assistance   Gait / Transfers Assistance Needed: with quad cane     Comments: need more info re: PLOF     Hand Dominance        Extremity/Trunk Assessment   Upper Extremity Assessment Upper Extremity Assessment: LUE deficits/detail;Defer to OT evaluation LUE Deficits / Details: noted decr coordination and decr strength    Lower Extremity Assessment Lower Extremity Assessment: Generalized weakness;LLE deficits/detail LLE Coordination: decreased fine motor;decreased gross motor  Communication   Communication: Expressive difficulties  Cognition Arousal/Alertness: Awake/alert Behavior During Therapy: WFL for tasks assessed/performed Overall Cognitive Status: No family/caregiver present to determine baseline cognitive functioning                                        General Comments      Exercises     Assessment/Plan    PT Assessment Patient needs continued PT services  PT Problem List Decreased strength;Decreased range of  motion;Decreased activity tolerance;Decreased balance;Decreased mobility;Decreased coordination;Decreased knowledge of use of DME;Decreased safety awareness       PT Treatment Interventions DME instruction;Gait training;Stair training;Functional mobility training;Therapeutic activities;Therapeutic exercise;Balance training;Neuromuscular re-education;Patient/family education    PT Goals (Current goals can be found in the Care Plan section)  Acute Rehab PT Goals Patient Stated Goal: Did not state PT Goal Formulation: With patient Time For Goal Achievement: 12/21/19 Potential to Achieve Goals: Good    Frequency Min 4X/week   Barriers to discharge        Co-evaluation               AM-PAC PT "6 Clicks" Mobility  Outcome Measure Help needed turning from your back to your side while in a flat bed without using bedrails?: A Little Help needed moving from lying on your back to sitting on the side of a flat bed without using bedrails?: A Little Help needed moving to and from a bed to a chair (including a wheelchair)?: A Lot Help needed standing up from a chair using your arms (e.g., wheelchair or bedside chair)?: A Lot Help needed to walk in hospital room?: A Lot Help needed climbing 3-5 steps with a railing? : Total 6 Click Score: 13    End of Session Equipment Utilized During Treatment: Gait belt Activity Tolerance: Patient tolerated treatment well Patient left: in bed;with call bell/phone within reach Nurse Communication: Mobility status PT Visit Diagnosis: Unsteadiness on feet (R26.81);Other abnormalities of gait and mobility (R26.89);Hemiplegia and hemiparesis Hemiplegia - Right/Left: Left Hemiplegia - caused by: Cerebral infarction    Time: [id number] PT Time Calculation (min) (ACUTE ONLY): 15 min   Charges:   PT Evaluation $PT Eval Moderate Complexity: 1 Mod          Roney [place], [place]  Acute Rehabilitation Services Pager [phone] Office [phone]   Colletta [place] 12/07/2019, 5:23 PM

## 2019-12-07 NOTE — ED Notes (Signed)
Patient being evaluated by SLP.

## 2019-12-07 NOTE — ED Notes (Signed)
   12/07/19 1844  Vitals  Temp 98.2 F (36.8 C)  Temp Source Oral  BP (!) 208/94  MAP (mmHg) 127  BP Location Left Arm  BP Method Automatic  Patient Position (if appropriate) Lying  Pulse Rate 68  Resp 20  Oxygen Therapy  SpO2 100 %  O2 Device Room Air  MEWS Score  MEWS Temp 0  MEWS Systolic 2  MEWS Pulse 0  MEWS RR 0  MEWS LOC 0  MEWS Score 2  MEWS Score Color Yellow   Charge nurse notified as was provider / Dr. Starla Link.

## 2019-12-07 NOTE — ED Notes (Signed)
Attending provider, Dr. Starla Link at patient bedside.

## 2019-12-07 NOTE — Progress Notes (Signed)
Patient ID: Diamond Rice, female   DOB: 11-03-1970, 49 y.o.   MRN: [id number]  PROGRESS NOTE    Diamond Rice  [id number] DOB: 12-26-1970 DOA: 12/06/2019 PCP: [name], MD   Brief Narrative:  49 year old female with history of hemorrhagic stroke in 10/07/2018 with residual left sided weakness, COPD, hypertension, hyperlipidemia, diabetes mellitus type 2, OSA on CPAP, anxiety, depression, substance abuse (marijuana, crack cocaine) presented with slurred speech.  Neurology was consulted: TPA was not given because of history of hemorrhagic stroke history.  CT of the head was negative for acute finding.  MRI of the brain showed acute small lacunar infarct in the left coronary radiata with no associated hemorrhage or mass-effect.  MRA head was negative for LVO.  Admitted for stroke work-up.  Assessment & Plan:   Acute ischemic stroke -Patient was seen by neurology and NIHSS 5.  TPA not given her LKW and hemorrhagic stroke history.  -Head CT negative for acute finding.  MRI brain showing a small acute lacunar infarct in the left corona radiata tracking into the posterior left lentiform.  No associated hemorrhage or mass-effect.  MRA head negative for LVO -Allow for permissive hypertension up to 220/120 -Continue aspirin and Plavix for 3 weeks as per neurology recommendations.  Continue statin. -Follow 2D echo.  PT/SLP/OT eval -LDL 95.  Follow A1c -Fall precautions.  Continue frequent neurochecks.  Mild hypokalemia -Improved  AKI -Prior creatinine between 1-1.3.  Presented with creatinine of 1.6.  Improved to 1.31 today.  Diuretic and ACE inhibitor on hold.  Monitor  COPD -Currently stable.  No signs of exacerbation.  Use DuoNeb as needed  OSA -Continue CPAP at night  Diabetes mellitus type 2 -Follow hemoglobin A1c.  Continue CBGs with SSI along with Levemir  History of polysubstance abuse -Urine drug screen pending.  Counseled about cessation.  Patient still using  drugs at least once a week as per the patient.  DVT prophylaxis: Lovenox Code Status: Full Family Communication: Spoke to patient at bedside Disposition Plan: Home in 1 to 2 days once clinically improves, tolerated physical therapy and cleared by neurology  Consultants: Neurology  Procedures: None  Antimicrobials: None   Subjective: Patient seen and examined at bedside in the emergency room.  She still feels that her speech has not improved yet.  Poor historian.  No overnight fever or vomiting reported.  Objective: Vitals:   12/07/19 0730 12/07/19 0830 12/07/19 0915 12/07/19 1000  BP: (!) 173/97 (!) 168/117 (!) 192/105   Pulse: 60 (!) 57 (!) 53 (!) 56  Resp: (!) 23 17 12 17   Temp:      TempSrc:      SpO2: 100% 100% 100% 100%   No intake or output data in the 24 hours ending 12/07/19 1044 There were no vitals filed for this visit.  Examination:  General exam: Appears calm and comfortable.  Looks older than stated age.  Poor historian. Respiratory system: Bilateral decreased breath sounds at bases Cardiovascular system: S1 & S2 heard, bradycardic Gastrointestinal system: Abdomen is nondistended, soft and nontender. Normal bowel sounds heard. Extremities: No cyanosis, clubbing, edema  Central nervous system: Sleepy, wakes up slightly, answers some questions, poor historian.  Has slight dysarthria.  No focal neurological deficits. Moving extremities Skin: No rashes, lesions or ulcers Psychiatry: Could not be assessed because of mental status    Data Reviewed: I have personally reviewed following labs and imaging studies  CBC: Recent Labs  Lab 12/06/19 1726 12/06/19 1728 12/06/19 1802  WBC  --  5.3  --   NEUTROABS  --  2.6  --   HGB 12.9 11.8* 12.6  HCT 38.0 37.7 37.0  MCV  --  74.5*  --   PLT  --  273  --    Basic Metabolic Panel: Recent Labs  Lab 12/06/19 1726 12/06/19 1728 12/06/19 1802 12/06/19 2308 12/07/19 0411  NA 141 140 143  --  144  K 3.1* 3.2*  3.3*  --  3.5  CL 106 107  --   --  111  CO2  --  21*  --   --  22  GLUCOSE 178* 180*  --   --  104*  BUN 17 17  --   --  15  CREATININE 1.50* 1.62*  --   --  1.31*  CALCIUM  --  8.8*  --   --  8.7*  MG  --   --   --  2.1  --    GFR: CrCl cannot be calculated (Unknown ideal weight.). Liver Function Tests: Recent Labs  Lab 12/06/19 1728  AST 17  ALT 8  ALKPHOS 49  BILITOT 0.4  PROT 7.0  ALBUMIN 3.0*   No results for input(s): LIPASE, AMYLASE in the last 168 hours. No results for input(s): AMMONIA in the last 168 hours. Coagulation Profile: Recent Labs  Lab 12/06/19 1728  INR 1.0   Cardiac Enzymes: No results for input(s): CKTOTAL, CKMB, CKMBINDEX, TROPONINI in the last 168 hours. BNP (last 3 results) No results for input(s): PROBNP in the last 8760 hours. HbA1C: No results for input(s): HGBA1C in the last 72 hours. CBG: Recent Labs  Lab 12/06/19 1730 12/07/19 0737  GLUCAP 144* 110*   Lipid Profile: Recent Labs    12/07/19 0411  CHOL 158  HDL 45  LDLCALC 95  TRIG 89  CHOLHDL 3.5   Thyroid Function Tests: No results for input(s): TSH, T4TOTAL, FREET4, T3FREE, THYROIDAB in the last 72 hours. Anemia Panel: No results for input(s): VITAMINB12, FOLATE, FERRITIN, TIBC, IRON, RETICCTPCT in the last 72 hours. Sepsis Labs: No results for input(s): PROCALCITON, LATICACIDVEN in the last 168 hours.  Recent Results (from the past 240 hour(s))  SARS CORONAVIRUS 2 (TAT 6-24 HRS) Nasopharyngeal Nasopharyngeal Swab     Status: None   Collection Time: 12/06/19  7:15 PM   Specimen: Nasopharyngeal Swab  Result Value Ref Range Status   SARS Coronavirus 2 NEGATIVE NEGATIVE Final    Comment: (NOTE) SARS-CoV-2 target nucleic acids are NOT DETECTED. The SARS-CoV-2 RNA is generally detectable in upper and lower respiratory specimens during the acute phase of infection. Negative results do not preclude SARS-CoV-2 infection, do not rule out co-infections with other pathogens,  and should not be used as the sole basis for treatment or other patient management decisions. Negative results must be combined with clinical observations, patient history, and epidemiological information. The expected result is Negative. Fact Sheet for Patients: SugarRoll.be Fact Sheet for Healthcare Providers: https://www.woods-mathews.com/ This test is not yet approved or cleared by the [place] FDA and  has been authorized for detection and/or diagnosis of SARS-CoV-2 by FDA under an Emergency Use Authorization (EUA). This EUA will remain  in effect (meaning this test can be used) for the duration of the COVID-19 declaration under Section 56 4(b)(1) of the Act, 21 U.S.C. section 360bbb-3(b)(1), unless the authorization is terminated or revoked sooner. Performed at [facility], [place] [address] [place], [place] [address]  Radiology Studies: MR ANGIO HEAD WO CONTRAST  Result Date: 12/06/2019 CLINICAL DATA:  49 year old female with acute on chronic small vessel disease. EXAM: MRA HEAD WITHOUT CONTRAST TECHNIQUE: Angiographic images of the Circle of Willis were obtained using MRA technique without intravenous contrast. COMPARISON:  Brain MRI today reported separately. Intracranial MRA 09/02/2018. FINDINGS: Chronically diminutive posterior circulation. Antegrade flow appears stable in diminutive distal vertebral arteries, the left terminates in PICA. The right PICA origin remains patent. The right vertebral supplies a patent but diminutive basilar artery. There is moderate to severe stenosis at the basilar artery origin which may be progressed from last year (series 1038, image 7). Fetal type bilateral PCA origins. The basilar terminates in the SCA is which remain patent. Bilateral fetal type PCA origins. Severe right PCA P1 and left PCA P2 stenoses are redemonstrated and stable. Stable antegrade flow in both ICA siphons. Normal  posterior communicating artery origins. Mild to moderate bilateral siphon anterior genu/supraclinoid irregularity appears stable with mild stenosis. Carotid termini remain patent. MCA and ACA origins remain patent but there is new moderate stenosis at the left A1 origin (series 1030, image 10). MCA M1 segments and bifurcations remain patent with mild irregularity. Visible bilateral MCA and ACA branches are stable with mild irregularity. IMPRESSION: 1. Chronically age advanced intracranial atherosclerosis. Negative for large vessel occlusion. 2. Diminutive vertebrobasilar system on the basis of fetal type PCA origins, but evidence of hemodynamically significant stenosis at the Vertebrobasilar Junction which appears progressed from last year. Chronic severe stenoses of the Right P1 and Left P2. 3. Stable mild stenoses of the bilateral ICA anterior genu. New moderate stenosis of the Left ACA origin. Electronically Signed   By: Genevie Ann M.D.   On: 12/06/2019 19:17   MR BRAIN WO CONTRAST  Result Date: 12/06/2019 CLINICAL DATA:  Code stroke. 49 year old female with slurred speech. History of prior stroke. EXAM: MRI HEAD WITHOUT CONTRAST TECHNIQUE: Multiplanar, multiecho pulse sequences of the brain and surrounding structures were obtained without intravenous contrast. COMPARISON:  Head CT earlier today. Brain MRI 01/14/2018. FINDINGS: Brain: Oval 10 mm focus of restricted diffusion in the left corona radiata tracking into the posterior left lentiform (series 5, image 73). Faint T2 and FLAIR hyperintensity. No hemorrhage or mass effect. No other restricted diffusion. Small chronic infarct in the deep left cerebellar nuclei is new since 2019. Extensive chronic small vessel infarcts scattered in the bilateral basal ganglia, thalami, right corona radiata. There is Wallerian degeneration in the right brainstem. Scattered chronic microhemorrhages, with abundant hemosiderin in the posterior right lentiform. Additional  scattered and confluent bilateral white matter T2 and FLAIR hyperintensity. No definite cortical encephalomalacia. No midline shift, mass effect, evidence of mass lesion, ventriculomegaly, extra-axial collection or acute intracranial hemorrhage. Cervicomedullary junction and pituitary are within normal limits. Vascular: Major intracranial vascular flow voids are stable since 2019. Skull and upper cervical spine: Negative visible cervical spine and bone marrow signal. Sinuses/Orbits: Negative orbits. Paranasal Visualized paranasal sinuses and mastoids are stable and well pneumatized. Other: Visible internal auditory structures appear normal. Scalp and face soft tissues appear negative. IMPRESSION: 1. Small acute lacunar infarct in the left corona radiata tracking into the posterior left lentiform. No associated hemorrhage or mass effect. 2. Underlying very severe chronic small vessel disease. 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 Electronically Signed   By: Genevie Ann M.D.   On: 12/06/2019 19:11   CT HEAD CODE STROKE WO CONTRAST  Result Date: 12/06/2019 CLINICAL DATA:  Code stroke. 49 year old female with  slurred speech. History of prior stroke. EXAM: CT HEAD WITHOUT CONTRAST TECHNIQUE: Contiguous axial images were obtained from the base of the skull through the vertex without intravenous contrast. COMPARISON:  Head CT 06/26/2019. Brain MRI 09/02/2018. FINDINGS: Brain: Advanced chronic cerebral white matter and deep gray nuclei hypodensity. No acute intracranial hemorrhage identified. No midline shift, mass effect, or evidence of intracranial mass lesion. No ventriculomegaly. Small chronic infarct in the left cerebellum. No cortically based acute infarct identified. Vascular: Calcified atherosclerosis at the skull base. No suspicious intracranial vascular hyperdensity. Skull: Stable hyperostosis. No acute osseous abnormality identified. Sinuses/Orbits: Visualized paranasal sinuses and mastoids  are stable and well pneumatized. Other: Leftward gaze. Negative scalp soft tissues. ASPECTS [facility] Stroke Program Early CT Score) Total score (0-10 with 10 being normal): 10 IMPRESSION: 1. Stable CT appearance of advanced chronic small vessel disease since October. ASPECTS 10. 2. These results were communicated to Dr. Lorraine Lax at 5:37 pmon 4/10/2021by text page via the [facility] messaging system. Electronically Signed   By: Genevie Ann M.D.   On: 12/06/2019 17:38        Scheduled Meds: .  stroke: mapping our early stages of recovery book   Does not apply Once  . ARIPiprazole  15 mg Oral QHS  . aspirin EC  81 mg Oral Daily  . atorvastatin  40 mg Oral q1800  . clopidogrel  75 mg Oral Daily  . enoxaparin (LOVENOX) injection  40 mg Subcutaneous Daily  . ferrous sulfate  325 mg Oral Q breakfast  . insulin aspart  0-15 Units Subcutaneous TID WC  . insulin detemir  15 Units Subcutaneous BID  . pentoxifylline  400 mg Oral TID WC  . potassium chloride  40 mEq Oral Once   Continuous Infusions: . sodium chloride 100 mL/hr at 12/07/19 0037          Aline August, MD [facility] 12/07/2019, 10:44 AM

## 2019-12-07 NOTE — Progress Notes (Signed)
  Echocardiogram 2D Echocardiogram has been performed.  Diamond Rice A Vikas Wegmann 12/07/2019, 11:29 AM

## 2019-12-07 NOTE — Progress Notes (Signed)
STROKE TEAM PROGRESS NOTE   HISTORY OF PRESENT ILLNESS (per record) Diamond Rice is an 49 y.o. female with PMH hemorrhagic stroke in January 2020 ( residual left side weakness), COPD ( not on baseline oxygen, HTN, HLD, and DM2, who presented to [facility] ED as a code stroke for slurred speech.  Per EMS and patient she last spoke words at midnight and her speech was normal at that time. At 1600 she woke up from a nap today and at 1620 called a friend and realized her speech was not right. EMS was called. Unknown what time she woke up today, but she states she felt fine, but did not speak so unknown is her speech was normal this morning. Denies any weakness, vision changes ED course:  CTH: no hemorrhage BP: 158/96 BG: 144 K: 3.1 creatinine 1.50 Chart review of past stroke:  MRI: acute hemorrhage with in posterior right BG. tPA Given:no;  Hemorrhagic CVA  Modified Rankin: Rankin Score=0 NIHSS:5   INTERVAL HISTORY I have personally reviewed history of presenting illness with the patient, electronic medical records and imaging films in PACS.  She presented with sudden onset of dysarthria and slurred speech which has persisted.  She has residual left-sided weakness from her recent stroke in January 2021.  MRI scan of the brain shows small 1 cm left corona radiata lacunar infarct.  MRA shows no large vessel occlusion shows severe stenosis of right P1 and left P2 segments.  Echocardiogram and carotid Dopplers are pending.  LDL cholesterol is elevated at 95 mg percent.  Urine drug screen is pending    OBJECTIVE Vitals:   12/07/19 0715 12/07/19 0730 12/07/19 0830 12/07/19 0915  BP: (!) 164/94 (!) 173/97 (!) 168/117 (!) 192/105  Pulse: (!) 59 60 (!) 57 (!) 53  Resp: 13 (!) 23 17 12   Temp:      TempSrc:      SpO2: 100% 100% 100% 100%    CBC:  Recent Labs  Lab 12/06/19 1728 12/06/19 1802  WBC 5.3  --   NEUTROABS 2.6  --   HGB 11.8* 12.6  HCT 37.7 37.0  MCV 74.5*  --   PLT 273  --      Basic Metabolic Panel:  Recent Labs  Lab 12/06/19 1728 12/06/19 1728 12/06/19 1802 12/06/19 2308 12/07/19 0411  NA 140   < > 143  --  144  K 3.2*   < > 3.3*  --  3.5  CL 107  --   --   --  111  CO2 21*  --   --   --  22  GLUCOSE 180*  --   --   --  104*  BUN 17  --   --   --  15  CREATININE 1.62*  --   --   --  1.31*  CALCIUM 8.8*  --   --   --  8.7*  MG  --   --   --  2.1  --    < > = values in this interval not displayed.    Lipid Panel:     Component Value Date/Time   CHOL 158 12/07/2019 0411   TRIG 89 12/07/2019 0411   HDL 45 12/07/2019 0411   CHOLHDL 3.5 12/07/2019 0411   VLDL 18 12/07/2019 0411   LDLCALC 95 12/07/2019 0411   HgbA1c:  Lab Results  Component Value Date   HGBA1C 7.0 (H) 09/01/2018   Urine Drug Screen:     Component  Value Date/Time   LABOPIA NONE DETECTED 09/06/2018 0028   COCAINSCRNUR NONE DETECTED 09/06/2018 0028   LABBENZ NONE DETECTED 09/06/2018 0028   AMPHETMU NONE DETECTED 09/06/2018 0028   THCU POSITIVE (A) 09/06/2018 0028   LABBARB NONE DETECTED 09/06/2018 0028    Alcohol Level     Component Value Date/Time   ETH <10 12/06/2019 1728    IMAGING  MR ANGIO HEAD WO CONTRAST 12/06/2019 IMPRESSION:  1. Chronically age advanced intracranial atherosclerosis. Negative for large vessel occlusion.  2. Diminutive vertebrobasilar system on the basis of fetal type PCA origins, but evidence of hemodynamically significant stenosis at the Vertebrobasilar Junction which appears progressed from last year. Chronic severe stenoses of the Right P1 and Left P2.  3. Stable mild stenoses of the bilateral ICA anterior genu. New moderate stenosis of the Left ACA origin.   MR BRAIN WO CONTRAST 12/06/2019 IMPRESSION:  1. Small acute lacunar infarct in the left corona radiata tracking into the posterior left lentiform. No associated hemorrhage or mass effect.  2. Underlying very severe chronic small vessel disease. Associated Wallerian degeneration in  the right brainstem.   CT HEAD CODE STROKE WO CONTRAST 12/06/2019 IMPRESSION:  Stable CT appearance of advanced chronic small vessel disease since October. ASPECTS 10.    Transthoracic Echocardiogram  00/00/2021 Pending   Bilateral Carotid Dopplers  00/00/2021 Pending   ECG - SR rate 73 BPM. (See cardiology reading for complete details)   PHYSICAL EXAM Blood pressure (!) 192/105, pulse (!) 53, temperature 97.9 F (36.6 C), temperature source Oral, resp. rate 12, SpO2 100 %. Mildly obese middle-aged African-American lady not in distress. . Afebrile. Head is nontraumatic. Neck is supple without bruit.    Cardiac exam no murmur or gallop. Lungs are clear to auscultation. Distal pulses are well felt. Neurological Exam :  Awake alert oriented to time place and person.  Dysarthric speech.  Extraocular movements full range without nystagmus.  Left lower facial weakness.  Tongue midline.  Motor system exam no upper or lower extremity drift but weakness of left grip and intrinsic hand muscles.  Fine finger movements are diminished on the left.  Orbits right over left upper extremity.  Mild weakness of left hip flexors and ankle dorsiflexors.  Tone is increased on the left compared to the right.  Deep tendon reflexes brisker on the left compared to the right.  Left plantar upgoing right downgoing.  Sensation appears intact.  Gait not tested     ASSESSMENT/PLAN [name] is a 49 y.o. female with history of a hemorrhagic stroke in January 2020 ( residual left side weakness), COPD ( not on baseline oxygen, HTN, HLD, and DM2, who presented to [facility] ED as a code stroke for slurred speech. She did not receive IV t-PA due to hx of hemorrhagic stroke.  Stroke: Small acute lacunar infarct in the left corona radiata tracking into the posterior left lentiform - small vessel disease  .  Resultant dysarthria and left hemiparesis  Code Stroke CT Head - Stable CT appearance of advanced chronic  small vessel disease since October. ASPECTS 10.   CT head - not ordered  MRI head - Small acute lacunar infarct in the left corona radiata tracking into the posterior left lentiform. Underlying very severe chronic small vessel disease. 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   MRA head - Negative for large vessel occlusion. Hemodynamically significant stenosis at the Vertebrobasilar Junction which appears progressed from last year. Chronic severe stenoses of the Right  P1 and Left P2.   CTA H&N - not ordered  CT Perfusion - not ordered  Carotid Doppler - will order  2D Echo - pending  Sars Corona Virus 2 - negative  LDL - 95  HgbA1c - pending  UDS -pending  VTE prophylaxis - Lovenox Diet  Diet Order            Diet NPO time specified  Diet effective now              aspirin 81 mg daily prior to admission, now on aspirin 81 mg daily and clopidogrel 75 mg daily  Patient counseled to be compliant with her antithrombotic medications  Ongoing aggressive stroke risk factor management  Therapy recommendations:  pending  Disposition:  Pending  Hypertension  Home BP meds: Clonidine ; Coreg ; Zestril ; Hydralazine  Current BP meds: none   Blood pressure somewhat high at times but within post stroke/TIA parameters . Permissive hypertension (OK if < 220/120) but gradually normalize in 5-7 days  . Long-term BP goal normotensive  Hyperlipidemia  Home Lipid lowering medication: none   LDL 95, goal < 70  Current lipid lowering medication: Lipitor 40 mg daily   Continue statin at discharge  Diabetes  Home diabetic meds: Insulin  Current diabetic meds: Insulin  HgbA1c pending, goal < 7.0 Recent Labs    12/06/19 1730 12/07/19 0737  GLUCAP 144* 110*    Other Stroke Risk Factors  Former cigarette smoker - quit  ETOH use, advised to drink no more than 1 alcoholic beverage per day.  Obesity, There is no height or weight on file to  calculate BMI., recommend weight loss, diet and exercise as appropriate   Hx stroke/TIA  Hx of Migraines  OSA - C pap  Family hx stroke (2 brothers and father)  Substance Abuse  Other Active Problems  Code status - Full code  CKD -  Stage 3b - creatinine - 1.62->1.31   Hospital day # 1  She presented with dysarthria from left subcortical lacunar infarct and has residual left hemiparesis from a previous stroke.  Recommend continue ongoing stroke work-up.  Aspirin Plavix for 3 weeks followed by aspirin alone.  Aggressive risk factor modification.  Patient counseled to quit smoking.  Discussed with Dr. Jen Mow.  Greater than 50% time during this 35-minute visit was spent on counseling and coordination of care about her lacunar stroke and answering questions for stroke prevention and treatment Antony Contras, MD To contact Stroke Continuity provider, please refer to [email or web address] After hours, contact General Neurology

## 2019-12-07 NOTE — ED Notes (Signed)
Patient being evaluated by Physical therapist at bedside.

## 2019-12-08 ENCOUNTER — Inpatient Hospital Stay (HOSPITAL_COMMUNITY): Payer: Medicare Other

## 2019-12-08 DIAGNOSIS — I6330 Cerebral infarction due to thrombosis of unspecified cerebral artery: Secondary | ICD-10-CM

## 2019-12-08 DIAGNOSIS — F1910 Other psychoactive substance abuse, uncomplicated: Secondary | ICD-10-CM

## 2019-12-08 DIAGNOSIS — R1310 Dysphagia, unspecified: Secondary | ICD-10-CM

## 2019-12-08 LAB — COMPREHENSIVE METABOLIC PANEL
ALT: 8 U/L (ref 0–44)
AST: 12 U/L — ABNORMAL LOW (ref 15–41)
Albumin: 3.1 g/dL — ABNORMAL LOW (ref 3.5–5.0)
Alkaline Phosphatase: 51 U/L (ref 38–126)
Anion gap: 12 (ref 5–15)
BUN: 14 mg/dL (ref 6–20)
CO2: 23 mmol/L (ref 22–32)
Calcium: 8.9 mg/dL (ref 8.9–10.3)
Chloride: 107 mmol/L (ref 98–111)
Creatinine, Ser: 1.44 mg/dL — ABNORMAL HIGH (ref 0.44–1.00)
GFR calc Af Amer: 50 mL/min — ABNORMAL LOW (ref >60)
GFR calc non Af Amer: 43 mL/min — ABNORMAL LOW (ref >60)
Glucose, Bld: 88 mg/dL (ref 70–99)
Potassium: 3.2 mmol/L — ABNORMAL LOW (ref 3.5–5.1)
Sodium: 142 mmol/L (ref 135–145)
Total Bilirubin: 1 mg/dL (ref 0.3–1.2)
Total Protein: 6.6 g/dL (ref 6.5–8.1)

## 2019-12-08 LAB — GLUCOSE, CAPILLARY
Glucose-Capillary: 109 mg/dL — ABNORMAL HIGH (ref 70–99)
Glucose-Capillary: 113 mg/dL — ABNORMAL HIGH (ref 70–99)
Glucose-Capillary: 83 mg/dL (ref 70–99)
Glucose-Capillary: 85 mg/dL (ref 70–99)
Glucose-Capillary: 86 mg/dL (ref 70–99)
Glucose-Capillary: 99 mg/dL (ref 70–99)

## 2019-12-08 LAB — CBC WITH DIFFERENTIAL/PLATELET
Abs Immature Granulocytes: 0.01 10*3/uL (ref 0.00–0.07)
Basophils Absolute: 0.1 10*3/uL (ref 0.0–0.1)
Basophils Relative: 1 %
Eosinophils Absolute: 0.1 10*3/uL (ref 0.0–0.5)
Eosinophils Relative: 2 %
HCT: 33.9 % — ABNORMAL LOW (ref 36.0–46.0)
Hemoglobin: 10.9 g/dL — ABNORMAL LOW (ref 12.0–15.0)
Immature Granulocytes: 0 %
Lymphocytes Relative: 49 %
Lymphs Abs: 3 10*3/uL (ref 0.7–4.0)
MCH: 23.6 pg — ABNORMAL LOW (ref 26.0–34.0)
MCHC: 32.2 g/dL (ref 30.0–36.0)
MCV: 73.5 fL — ABNORMAL LOW (ref 80.0–100.0)
Monocytes Absolute: 0.6 10*3/uL (ref 0.1–1.0)
Monocytes Relative: 9 %
Neutro Abs: 2.4 10*3/uL (ref 1.7–7.7)
Neutrophils Relative %: 39 %
Platelets: 238 10*3/uL (ref 150–400)
RBC: 4.61 MIL/uL (ref 3.87–5.11)
RDW: 15.9 % — ABNORMAL HIGH (ref 11.5–15.5)
WBC: 6.1 10*3/uL (ref 4.0–10.5)
nRBC: 0 % (ref 0.0–0.2)

## 2019-12-08 LAB — HEMOGLOBIN A1C
Hgb A1c MFr Bld: 6.6 % — ABNORMAL HIGH (ref 4.8–5.6)
Hgb A1c MFr Bld: 6.7 % — ABNORMAL HIGH (ref 4.8–5.6)
Mean Plasma Glucose: 143 mg/dL
Mean Plasma Glucose: 146 mg/dL

## 2019-12-08 LAB — MAGNESIUM: Magnesium: 2 mg/dL (ref 1.7–2.4)

## 2019-12-08 MED ORDER — LISINOPRIL 20 MG PO TABS
40.0000 mg | ORAL_TABLET | Freq: Every day | ORAL | Status: DC
  Administered 2019-12-08 – 2019-12-10 (×3): 40 mg via ORAL
  Filled 2019-12-08 (×4): qty 2

## 2019-12-08 MED ORDER — LABETALOL HCL 5 MG/ML IV SOLN
20.0000 mg | Freq: Once | INTRAVENOUS | Status: AC
  Administered 2019-12-08: 20 mg via INTRAVENOUS
  Filled 2019-12-08: qty 4

## 2019-12-08 MED ORDER — POTASSIUM CHLORIDE CRYS ER 20 MEQ PO TBCR
40.0000 meq | EXTENDED_RELEASE_TABLET | ORAL | Status: AC
  Administered 2019-12-08 (×2): 40 meq via ORAL
  Filled 2019-12-08 (×2): qty 2

## 2019-12-08 MED ORDER — HYDRALAZINE HCL 50 MG PO TABS
100.0000 mg | ORAL_TABLET | Freq: Three times a day (TID) | ORAL | Status: DC
  Administered 2019-12-08 – 2019-12-10 (×7): 100 mg via ORAL
  Filled 2019-12-08 (×8): qty 2

## 2019-12-08 MED ORDER — CLONIDINE HCL 0.1 MG PO TABS: 0.3000 mg | ORAL_TABLET | Freq: Two times a day (BID) | ORAL | Status: DC

## 2019-12-08 MED ORDER — CLONIDINE HCL 0.1 MG PO TABS
0.3000 mg | ORAL_TABLET | Freq: Two times a day (BID) | ORAL | Status: DC
  Administered 2019-12-08 – 2019-12-10 (×4): 0.3 mg via ORAL
  Filled 2019-12-08 (×4): qty 3

## 2019-12-08 MED ORDER — ONDANSETRON HCL 4 MG/2ML IJ SOLN
4.0000 mg | Freq: Four times a day (QID) | INTRAMUSCULAR | Status: DC | PRN
  Administered 2019-12-08 – 2019-12-09 (×3): 4 mg via INTRAVENOUS
  Filled 2019-12-08 (×3): qty 2

## 2019-12-08 NOTE — TOC Initial Note (Addendum)
Transition of Care [facility]) - Initial/Assessment Note    Patient Details  Name: Diamond Rice MRN: [id number] Date of Birth: October 19, 1970  Transition of Care [facility]) CM/SW Contact:    Pollie Friar, RN Phone Number: 12/08/2019, 2:06 PM  Clinical Narrative:                 Recommendations are for SNF. Pt wont have 24 hour supervision at d/c. CM inquired about SNF rehab and she was in agreement to being faxed out. Pt states she has been to [facility] before and would open to returning. CM has faxed her out to SNF in [facility] per her request. Will provide bed offers when available. Pts PASAR went to manual review with information faxed in.  TOC following for d/c disposition.  1630 Addendum: Pt has decided she wants to attend a rehab in [place] closer to family. CM has attempted to reach [facility] and [facility] without success. Will attempt again in the am.   Expected Discharge Plan: IP Rehab Facility Barriers to Discharge: Continued Medical Work up   Patient Goals and CMS Choice   CMS Medicare.gov Compare Post Acute Care list provided to:: Patient Choice offered to / list presented to : Patient  Expected Discharge Plan and Services Expected Discharge Plan: [place] In-house Referral: Clinical Social Work Discharge Planning Services: CM Consult Post Acute Care Choice: IP Rehab, [place] Living arrangements for the past 2 months: Apartment                                      Prior Living Arrangements/Services Living arrangements for the past 2 months: Apartment Lives with:: Roommate Patient language and need for interpreter reviewed:: Yes Do you feel safe going back to the place where you live?: Yes      Need for Family Participation in Patient Care: Yes (Comment) Care giver support system in place?: No (comment)   Criminal Activity/Legal Involvement Pertinent to Current Situation/Hospitalization: No - Comment as  needed  Activities of Daily Living      Permission Sought/Granted                  Emotional Assessment Appearance:: Appears stated age Attitude/Demeanor/Rapport: Engaged Affect (typically observed): Accepting Orientation: : Oriented to Self, Oriented to Place, Oriented to  Time, Oriented to Situation Alcohol / Substance Use: Illicit Drugs Psych Involvement: No (comment)  Admission diagnosis:  Acute CVA (cerebrovascular accident) ([place]) [I63.9] Cerebrovascular accident (CVA), unspecified mechanism ([place]) [I63.9] Patient Active Problem List   Diagnosis Date Noted  . Acute CVA (cerebrovascular accident) ([place]) 12/06/2019  . AKI (acute kidney injury) ([place]) 12/06/2019  . LVH (left ventricular hypertrophy) 08/10/2019  . Snoring 02/19/2019  . Hemorrhagic stroke ([place])   . Diastolic dysfunction   . Type 2 diabetes mellitus ([place])   . Anemia of chronic disease   . Chronic obstructive pulmonary disease ([place])   . ICH (intracerebral hemorrhage) ([place]) 08/30/2018  . Hypertensive heart disease without heart failure 07/04/2018  . Nonrheumatic aortic valve insufficiency 07/04/2018  . Esophageal abnormality   . Class 2 severe obesity due to excess calories with serious comorbidity and body mass index (BMI) of 35.0 to 35.9 in adult [facility])   . Peripheral vascular disease ([place])   . History of osteomyelitis   . History of amputation of lesser toe ([place])   . Pulmonary embolism ([place]) 06/22/2018  .  Microcytic anemia 06/17/2018  . At risk for adverse drug reaction 06/11/2018  . Critical lower limb ischemia 05/27/2018  . GI bleeding 04/02/2018  . Acute renal failure (ARF) ([place]) 04/02/2018  . Hypokalemia 04/02/2018  . Polysubstance abuse ([place]) 04/02/2018  . Diabetic foot ulcer ([place]) 04/02/2018  . Cannabis use disorder, moderate, dependence ([place]) 05/19/2017  . Cocaine use disorder, moderate, dependence ([place]) 05/19/2017  . Schizoaffective disorder, bipolar type ([place]) 10/07/2015  . OSA (obstructive  sleep apnea) 06/09/2015  . Vitamin D deficiency 01/06/2015  . Bilateral knee pain 01/05/2015  . Numbness and tingling in right hand 01/05/2015  . Insomnia 12/07/2014  . Right-sided lacunar infarction ([place]) 09/15/2014  . Left hemiparesis ([place]) 09/15/2014  . Dyspnea   . Back pain 09/11/2013  . Psychoactive substance-induced organic mood disorder ([place]) 05/28/2013  . Cocaine abuse with cocaine-induced mood disorder ([place]) 05/28/2013  . Cannabis dependence with cannabis-induced anxiety disorder ([place]) 05/28/2013  . Morbid obesity with BMI of 45.0-49.9, adult ([place]) 04/25/2012  . Chest pain 04/17/2012  . Hypertension 04/17/2012  . DM (diabetes mellitus) with peripheral vascular complication ([place]) [id number]  . Hyperlipidemia 04/17/2012  . Tobacco use 04/17/2012   PCP:  [name], MD Pharmacy:   [facility] [place], [address] AT [address] [place] [address] Phone: [phone] Fax: [phone]     Social Determinants of Health (SDOH) Interventions    Readmission Risk Interventions No flowsheet data found.

## 2019-12-08 NOTE — Progress Notes (Signed)
Modified Barium Swallow Progress Note  Patient Details  Name: Diamond Rice MRN: [id number] Date of Birth: November 02, 1970  Today's Date: 12/08/2019  Modified Barium Swallow completed.  Full report located under Chart Review in the Imaging Section.  Brief recommendations include the following:  Clinical Impression   Patient presents with mild oropharyngeal dysphagia. Oral phase is remarkable for decreased bolus cohesion, premature spillage to the vallecula and pyriform sinuses and lingual residue. When trialing a regular solid, the pt stated she was going to swallow the bolus before attempting to masticate, requiring Min cues to continue to fully masticate prior to swallowing. Pharyngeal phase is remarkable for delayed swallow initiation at the pyriform sinuses and reduced laryngeal closure.  Patient's vallecular space was also noted to be small, making it difficult for the bolus to collect there prior to the swallow. Liquids would either spill over into the larynx or to the pyriform sinsues. The small vallecular space and reduced laryngeal closure ultimately resulted in penetration/aspiration. Aspiration (PAS 7 or 8) occurred during the swallow with thin liquids. As the pt was challenged by taking large consecutive sips, she aspirated, spit out what was remaining in her oral cavity and was noted with suspected nasopharyngeal regurgitation (nasal secretions and what appeared to be some barium expelled from her nasal cavity). The fluoro was not on during suspected nasopharyngeal regurgitation, therefore cannot confirm. A chin tuck was trialed to attempt to increase the space in the vallecula, however, this was unsuccessful. Recommend Dys 2 diet, nectar thick liquids, meds crushed in puree, with full supervision. Ensure the pt is taking small, single sips at a time and is following general aspiration precautions.   Will continue to follow, and will complete speech-language evaluation as able.    Swallow  Evaluation Recommendations      SLP Diet Recommendations: Dysphagia 2 (Fine chop) solids;Nectar thick liquid   Liquid Administration via: Cup;Straw   Medication Administration: Crushed with puree   Supervision: Full supervision/cueing for compensatory strategies   Compensations: Minimize environmental distractions;Slow rate;Small sips/bites   Postural Changes: Remain semi-upright after after feeds/meals (Comment);Seated upright at 90 degrees   Oral Care Recommendations: Oral care BID   Other Recommendations: Prohibited food (jello, ice cream, thin soups);Order thickener from pharmacy;Remove water pitcher   Aline August, Student SLP Office: [phone] 12/08/2019,8:52 AM

## 2019-12-08 NOTE — Progress Notes (Signed)
VASCULAR LAB PRELIMINARY  PRELIMINARY  PRELIMINARY  PRELIMINARY  Carotid duplex completed.    Preliminary report:  See CV proc for preliminary results.  Manaal Mandala, RVT 12/08/2019, 12:14 PM

## 2019-12-08 NOTE — Progress Notes (Signed)
STROKE TEAM PROGRESS NOTE      INTERVAL HISTORY Patient states she still has dysarthria and slurred speech which has not improved.  Echocardiogram is unremarkable.  LDL cholesterol 95 mg percent.  Hemoglobin A1c is pending.  Urine drug screen is positive for cocaine and marijuana.  Patient has been seen by physical therapy and who recommended inpatient rehab blood pressure remains significantly elevated.  OBJECTIVE Vitals:   12/08/19 1129 12/08/19 1135 12/08/19 1209 12/08/19 1400  BP: (!) 227/103 (!) 204/87 (!) 220/110 (!) 193/110  Pulse: 66     Resp: 18     Temp: 98.5 F (36.9 C)     TempSrc: Oral     SpO2: 100%     Weight:      Height:        CBC:  Recent Labs  Lab 12/06/19 1728 12/06/19 1728 12/06/19 1802 12/08/19 0342  WBC 5.3  --   --  6.1  NEUTROABS 2.6  --   --  2.4  HGB 11.8*   < > 12.6 10.9*  HCT 37.7   < > 37.0 33.9*  MCV 74.5*  --   --  73.5*  PLT 273  --   --  238   < > = values in this interval not displayed.    Basic Metabolic Panel:  Recent Labs  Lab 12/06/19 1728 12/06/19 2308 12/07/19 0411 12/08/19 0342  NA   < >  --  144 142  K   < >  --  3.5 3.2*  CL   < >  --  111 107  CO2   < >  --  22 23  GLUCOSE   < >  --  104* 88  BUN   < >  --  15 14  CREATININE   < >  --  1.31* 1.44*  CALCIUM   < >  --  8.7* 8.9  MG  --  2.1  --  2.0   < > = values in this interval not displayed.    Lipid Panel:     Component Value Date/Time   CHOL 158 12/07/2019 0411   TRIG 89 12/07/2019 0411   HDL 45 12/07/2019 0411   CHOLHDL 3.5 12/07/2019 0411   VLDL 18 12/07/2019 0411   LDLCALC 95 12/07/2019 0411   HgbA1c:  Lab Results  Component Value Date   HGBA1C 6.7 (H) 12/07/2019   Urine Drug Screen:     Component Value Date/Time   LABOPIA NONE DETECTED 12/07/2019 1449   COCAINSCRNUR POSITIVE (A) 12/07/2019 1449   LABBENZ NONE DETECTED 12/07/2019 1449   AMPHETMU NONE DETECTED 12/07/2019 1449   THCU POSITIVE (A) 12/07/2019 1449   LABBARB NONE DETECTED  12/07/2019 1449    Alcohol Level     Component Value Date/Time   ETH <10 12/06/2019 1728    IMAGING  MR ANGIO HEAD WO CONTRAST 12/06/2019 IMPRESSION:  1. Chronically age advanced intracranial atherosclerosis. Negative for large vessel occlusion.  2. Diminutive vertebrobasilar system on the basis of fetal type PCA origins, but evidence of hemodynamically significant stenosis at the Vertebrobasilar Junction which appears progressed from last year. Chronic severe stenoses of the Right P1 and Left P2.  3. Stable mild stenoses of the bilateral ICA anterior genu. New moderate stenosis of the Left ACA origin.   MR BRAIN WO CONTRAST 12/06/2019 IMPRESSION:  1. Small acute lacunar infarct in the left corona radiata tracking into the posterior left lentiform. No associated hemorrhage or mass effect.  2. Underlying very severe chronic small vessel disease. 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   CT HEAD CODE STROKE WO CONTRAST 12/06/2019 IMPRESSION:  Stable CT appearance of advanced chronic small vessel disease since October. ASPECTS 10.    Transthoracic Echocardiogram  00/00/2021 Pending   Bilateral Carotid Dopplers  00/00/2021 Pending   ECG - SR rate 73 BPM. (See cardiology reading for complete details)   PHYSICAL EXAM Blood pressure (!) 193/110, pulse 66, temperature 98.5 F (36.9 C), temperature source Oral, resp. rate 18, height 5\' 1"  (1.549 m), weight 82 kg, SpO2 100 %. Mildly obese middle-aged African-American lady not in distress. . Afebrile. Head is nontraumatic. Neck is supple without bruit.    Cardiac exam no murmur or gallop. Lungs are clear to auscultation. Distal pulses are well felt. Neurological Exam :  Awake alert oriented to time place and person.  Dysarthric speech.  Extraocular movements full range without nystagmus.  Left lower facial weakness.  Tongue midline.  Motor system exam no upper or lower extremity drift but weakness of left grip and  intrinsic hand muscles.  Fine finger movements are diminished on the left.  Orbits right over left upper extremity.  Mild weakness of left hip flexors and ankle dorsiflexors.  Tone is increased on the left compared to the right.  Deep tendon reflexes brisker on the left compared to the right.  Left plantar upgoing right downgoing.  Sensation appears intact.  Gait not tested     ASSESSMENT/PLAN [name] is a 49 y.o. female with history of a hemorrhagic stroke in January 2020 ( residual left side weakness), COPD ( not on baseline oxygen, HTN, HLD, and DM2, who presented to [facility] ED as a code stroke for slurred speech. She did not receive IV t-PA due to hx of hemorrhagic stroke.  Stroke: Small acute lacunar infarct in the left corona radiata tracking into the posterior left lentiform - small vessel disease  .  Resultant dysarthria and left hemiparesis  Code Stroke CT Head - Stable CT appearance of advanced chronic small vessel disease since October. ASPECTS 10.   CT head - not ordered  MRI head - Small acute lacunar infarct in the left corona radiata tracking into the posterior left lentiform. Underlying very severe chronic small vessel disease. 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   MRA head - Negative for large vessel occlusion. Hemodynamically significant stenosis at the Vertebrobasilar Junction which appears progressed from last year. Chronic severe stenoses of the Right P1 and Left P2.   CTA H&N - not ordered  CT Perfusion - not ordered  Carotid Doppler -bilateral mild plaques but no hemodynamically significant stenosis.  2D Echo -normal ejection fraction.  No cardiac source of embolism.  Sars Corona Virus 2 - negative  LDL - 95  HgbA1c -6.7  UDS -positive for cocaine and marijuana  VTE prophylaxis - Lovenox Diet  Diet Order            Diet heart healthy/carb modified Room service appropriate? Yes; Fluid consistency: Nectar Thick  Diet  effective now              aspirin 81 mg daily prior to admission, now on aspirin 81 mg daily and clopidogrel 75 mg daily  Patient counseled to be compliant with her antithrombotic medications  Ongoing aggressive stroke risk factor management  Therapy recommendations:  pending  Disposition:  Pending  Hypertension  Home BP meds: Clonidine ; Coreg ; Zestril ; Hydralazine  Current  BP meds: none   Blood pressure somewhat high at times but within post stroke/TIA parameters . Permissive hypertension (OK if < 220/120) but gradually normalize in 5-7 days  . Long-term BP goal normotensive  Hyperlipidemia  Home Lipid lowering medication: none   LDL 95, goal < 70  Current lipid lowering medication: Lipitor 40 mg daily   Continue statin at discharge  Diabetes  Home diabetic meds: Insulin  Current diabetic meds: Insulin  HgbA1c 6.7, goal < 7.0 Recent Labs    12/08/19 0418 12/08/19 0730 12/08/19 1138  GLUCAP 85 86 109*    Other Stroke Risk Factors  Former cigarette smoker - quit  ETOH use, advised to drink no more than 1 alcoholic beverage per day.  Obesity, Body mass index is 34.16 kg/m., recommend weight loss, diet and exercise as appropriate   Hx stroke/TIA  Hx of Migraines  OSA - C pap  Family hx stroke (2 brothers and father)  Substance Abuse  Other Active Problems  Code status - Full code  CKD -  Stage 3b - creatinine - 1.62->1.31   Hospital day # 2  She presented with dysarthria from left subcortical lacunar infarct and has residual left hemiparesis from a previous stroke.  Recommend continue ongoing stroke work-up.  Aspirin Plavix for 3 weeks followed by aspirin alone.  Aggressive risk factor modification.  Patient counseled to quit smoking cigarettes, marijuana and using cocaine..  Discussed with Dr. Jen Mow.  Greater than 50% time during this 25-minute visit was spent on counseling and coordination of care about her lacunar stroke and  answering questions for stroke prevention and treatment.  Stroke team will sign off.  Kindly call for questions. Antony Contras, MD To contact Stroke Continuity provider, please refer to [email or web address] After hours, contact General Neurology

## 2019-12-08 NOTE — Progress Notes (Signed)
Patient ID: Diamond Rice, female   DOB: 31-Jul-1971, 49 y.o.   MRN: [id number]  PROGRESS NOTE    Francesa Brunelle  [id number] DOB: Jun 20, 1971 DOA: 12/06/2019 PCP: [name], MD   Brief Narrative:  49 year old female with history of hemorrhagic stroke in 10/07/2018 with residual left sided weakness, COPD, hypertension, hyperlipidemia, diabetes mellitus type 2, OSA on CPAP, anxiety, depression, substance abuse (marijuana, crack cocaine) presented with slurred speech.  Neurology was consulted: TPA was not given because of history of hemorrhagic stroke history.  CT of the head was negative for acute finding.  MRI of the brain showed acute small lacunar infarct in the left coronary radiata with no associated hemorrhage or mass-effect.  MRA head was negative for LVO.  Admitted for stroke work-up.  Assessment & Plan:   Acute ischemic stroke Dysphagia: Most likely neurogenic -Patient was seen by neurology and NIHSS 5.  TPA not given her LKW and hemorrhagic stroke history.  -Head CT negative for acute finding.  MRI brain showing a small acute lacunar infarct in the left corona radiata tracking into the posterior left lentiform.  No associated hemorrhage or mass-effect.  MRA head negative for LVO -Allow for permissive hypertension up to 220/120 -Continue aspirin and Plavix for 3 weeks as per neurology recommendations.  Continue statin. -2D echo showed EF of 65 to 70% with grade 1 diastolic dysfunction.  Carotid duplex pending.  Diet as per SLP recommendations.  PT recommends CIR placement.  Consult CIR. -LDL 95.  Hemoglobin 6.7 -Fall precautions.  Continue frequent neurochecks.  Mild hypokalemia -Replace.  AKI -Prior creatinine between 1-1.3.  Presented with creatinine of 1.6.  Creatinine 1.44 today.  Diuretic and ACE inhibitor on hold.  Monitor  COPD -Currently stable.  No signs of exacerbation.  Use DuoNeb as needed  OSA -Continue CPAP at night  Diabetes mellitus type 2 -Follow  hemoglobin A1c.  Continue CBGs with SSI along with Levemir  History of polysubstance abuse -Urine drug screen is positive for cocaine and tetrahydrocannabinol.  Social worker consult.  Counseled about cessation.  Patient still using drugs at least once a week as per the patient.  DVT prophylaxis: Lovenox Code Status: Full Family Communication: Spoke to patient at bedside Disposition Plan: Probable CIR once bed is available.  Consultants: Neurology  Procedures: None  Antimicrobials: None   Subjective: Patient seen and examined at bedside.  Patient is a very poor historian.  Speech is not that clear.  Drowsy, wakes up slightly, hardly answers any questions.  No overnight fever, vomiting or agitation reported. Objective: Vitals:   12/07/19 2023 12/08/19 0021 12/08/19 0351 12/08/19 0727  BP: (!) 197/82 (!) 195/94 (!) 172/99 (!) 196/116  Pulse: (!) 54 71 67 68  Resp: 20 20 20 20   Temp: 97.8 F (36.6 C) 97.7 F (36.5 C) 97.8 F (36.6 C) 98 F (36.7 C)  TempSrc: Oral Oral Oral Oral  SpO2: 100% 98% 99% 100%  Weight:      Height:        Intake/Output Summary (Last 24 hours) at 12/08/2019 1036 Last data filed at 12/07/2019 1212 Gross per 24 hour  Intake 1158.33 ml  Output --  Net 1158.33 ml   Filed Weights   12/07/19 1844  Weight: 82 kg    Examination:  General exam: No acute distress.  Looks older than stated age.  Poor historian.  Drowsy, wakes up slightly, speech is not clear. Respiratory system: Bilateral decreased breath sounds at bases with some scattered crackles  Cardiovascular system: S1 & S2 heard, currently rate controlled Gastrointestinal system: Abdomen is obese, nondistended, soft and nontender.  Bowel sounds heard Extremities: No cyanosis, clubbing; trace edema   Data Reviewed: I have personally reviewed following labs and imaging studies  CBC: Recent Labs  Lab 12/06/19 1726 12/06/19 1728 12/06/19 1802 12/08/19 0342  WBC  --  5.3  --  6.1   NEUTROABS  --  2.6  --  2.4  HGB 12.9 11.8* 12.6 10.9*  HCT 38.0 37.7 37.0 33.9*  MCV  --  74.5*  --  73.5*  PLT  --  273  --  [id number]   Basic Metabolic Panel: Recent Labs  Lab 12/06/19 1726 12/06/19 1728 12/06/19 1802 12/06/19 2308 12/07/19 0411 12/08/19 0342  NA 141 140 143  --  144 142  K 3.1* 3.2* 3.3*  --  3.5 3.2*  CL 106 107  --   --  111 107  CO2  --  21*  --   --  22 23  GLUCOSE 178* 180*  --   --  104* 88  BUN 17 17  --   --  15 14  CREATININE 1.50* 1.62*  --   --  1.31* 1.44*  CALCIUM  --  8.8*  --   --  8.7* 8.9  MG  --   --   --  2.1  --  2.0   GFR: Estimated Creatinine Clearance: 46.9 mL/min (A) (by C-G formula based on SCr of 1.44 mg/dL (H)). Liver Function Tests: Recent Labs  Lab 12/06/19 1728 12/08/19 0342  AST 17 12*  ALT 8 8  ALKPHOS 49 51  BILITOT 0.4 1.0  PROT 7.0 6.6  ALBUMIN 3.0* 3.1*   No results for input(s): LIPASE, AMYLASE in the last 168 hours. No results for input(s): AMMONIA in the last 168 hours. Coagulation Profile: Recent Labs  Lab 12/06/19 1728  INR 1.0   Cardiac Enzymes: No results for input(s): CKTOTAL, CKMB, CKMBINDEX, TROPONINI in the last 168 hours. BNP (last 3 results) No results for input(s): PROBNP in the last 8760 hours. HbA1C: Recent Labs    12/06/19 2308 12/07/19 0410  HGBA1C 6.6* 6.7*   CBG: Recent Labs  Lab 12/07/19 1215 12/07/19 2025 12/08/19 0018 12/08/19 0418 12/08/19 0730  GLUCAP 103* 97 83 85 86   Lipid Profile: Recent Labs    12/07/19 0411  CHOL 158  HDL 45  LDLCALC 95  TRIG 89  CHOLHDL 3.5   Thyroid Function Tests: No results for input(s): TSH, T4TOTAL, FREET4, T3FREE, THYROIDAB in the last 72 hours. Anemia Panel: No results for input(s): VITAMINB12, FOLATE, FERRITIN, TIBC, IRON, RETICCTPCT in the last 72 hours. Sepsis Labs: No results for input(s): PROCALCITON, LATICACIDVEN in the last 168 hours.  Recent Results (from the past 240 hour(s))  SARS CORONAVIRUS 2 (TAT 6-24 HRS)  Nasopharyngeal Nasopharyngeal Swab     Status: None   Collection Time: 12/06/19  7:15 PM   Specimen: Nasopharyngeal Swab  Result Value Ref Range Status   SARS Coronavirus 2 NEGATIVE NEGATIVE Final    Comment: (NOTE) SARS-CoV-2 target nucleic acids are NOT DETECTED. The SARS-CoV-2 RNA is generally detectable in upper and lower respiratory specimens during the acute phase of infection. Negative results do not preclude SARS-CoV-2 infection, do not rule out co-infections with other pathogens, and should not be used as the sole basis for treatment or other patient management decisions. Negative results must be combined with clinical observations, patient history, and epidemiological information. The  expected result is Negative. Fact Sheet for Patients: SugarRoll.be Fact Sheet for Healthcare Providers: https://www.woods-mathews.com/ This test is not yet approved or cleared by the [place] FDA and  has been authorized for detection and/or diagnosis of SARS-CoV-2 by FDA under an Emergency Use Authorization (EUA). This EUA will remain  in effect (meaning this test can be used) for the duration of the COVID-19 declaration under Section 56 4(b)(1) of the Act, 21 U.S.C. section 360bbb-3(b)(1), unless the authorization is terminated or revoked sooner. Performed at [facility], [place] [address], [place],  [address]          Radiology Studies: MR ANGIO HEAD WO CONTRAST  Result Date: 12/06/2019 CLINICAL DATA:  49 year old female with acute on chronic small vessel disease. EXAM: MRA HEAD WITHOUT CONTRAST TECHNIQUE: Angiographic images of the Circle of Willis were obtained using MRA technique without intravenous contrast. COMPARISON:  Brain MRI today reported separately. Intracranial MRA 09/02/2018. FINDINGS: Chronically diminutive posterior circulation. Antegrade flow appears stable in diminutive distal vertebral arteries, the left  terminates in PICA. The right PICA origin remains patent. The right vertebral supplies a patent but diminutive basilar artery. There is moderate to severe stenosis at the basilar artery origin which may be progressed from last year (series 1038, image 7). Fetal type bilateral PCA origins. The basilar terminates in the SCA is which remain patent. Bilateral fetal type PCA origins. Severe right PCA P1 and left PCA P2 stenoses are redemonstrated and stable. Stable antegrade flow in both ICA siphons. Normal posterior communicating artery origins. Mild to moderate bilateral siphon anterior genu/supraclinoid irregularity appears stable with mild stenosis. Carotid termini remain patent. MCA and ACA origins remain patent but there is new moderate stenosis at the left A1 origin (series 1030, image 10). MCA M1 segments and bifurcations remain patent with mild irregularity. Visible bilateral MCA and ACA branches are stable with mild irregularity. IMPRESSION: 1. Chronically age advanced intracranial atherosclerosis. Negative for large vessel occlusion. 2. Diminutive vertebrobasilar system on the basis of fetal type PCA origins, but evidence of hemodynamically significant stenosis at the Vertebrobasilar Junction which appears progressed from last year. Chronic severe stenoses of the Right P1 and Left P2. 3. Stable mild stenoses of the bilateral ICA anterior genu. New moderate stenosis of the Left ACA origin. Electronically Signed   By: Genevie Ann M.D.   On: 12/06/2019 19:17   MR BRAIN WO CONTRAST  Result Date: 12/06/2019 CLINICAL DATA:  Code stroke. 49 year old female with slurred speech. History of prior stroke. EXAM: MRI HEAD WITHOUT CONTRAST TECHNIQUE: Multiplanar, multiecho pulse sequences of the brain and surrounding structures were obtained without intravenous contrast. COMPARISON:  Head CT earlier today. Brain MRI 01/14/2018. FINDINGS: Brain: Oval 10 mm focus of restricted diffusion in the left corona radiata tracking  into the posterior left lentiform (series 5, image 73). Faint T2 and FLAIR hyperintensity. No hemorrhage or mass effect. No other restricted diffusion. Small chronic infarct in the deep left cerebellar nuclei is new since 2019. Extensive chronic small vessel infarcts scattered in the bilateral basal ganglia, thalami, right corona radiata. There is Wallerian degeneration in the right brainstem. Scattered chronic microhemorrhages, with abundant hemosiderin in the posterior right lentiform. Additional scattered and confluent bilateral white matter T2 and FLAIR hyperintensity. No definite cortical encephalomalacia. No midline shift, mass effect, evidence of mass lesion, ventriculomegaly, extra-axial collection or acute intracranial hemorrhage. Cervicomedullary junction and pituitary are within normal limits. Vascular: Major intracranial vascular flow voids are stable since 2019. Skull and upper cervical spine:  Negative visible cervical spine and bone marrow signal. Sinuses/Orbits: Negative orbits. Paranasal Visualized paranasal sinuses and mastoids are stable and well pneumatized. Other: Visible internal auditory structures appear normal. Scalp and face soft tissues appear negative. IMPRESSION: 1. Small acute lacunar infarct in the left corona radiata tracking into the posterior left lentiform. No associated hemorrhage or mass effect. 2. Underlying very severe chronic small vessel disease. 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 Electronically Signed   By: Genevie Ann M.D.   On: 12/06/2019 19:11   DG Swallowing Func-Speech Pathology  Result Date: 12/08/2019 Objective Swallowing Evaluation: Type of Study: MBS-Modified Barium Swallow Study  Patient Details Name: Tynesia Hartlieb MRN: [id number] Date of Birth: 1970-12-20 Today's Date: 12/08/2019 Time: SLP Start Time (ACUTE ONLY): 0805 -SLP Stop Time (ACUTE ONLY): 0826 SLP Time Calculation (min) (ACUTE ONLY): 21 min Past Medical History: Past Medical History:  Diagnosis Date . Anxiety  . Chronic lower back pain 04/17/2012  "just got over some; catched when I walked" . COPD (chronic obstructive pulmonary disease) ([place])  . Critical lower limb ischemia 05/27/2018 . Depression  . Headache(784.0) 04/17/2012  "~ qod; lately waking up in am w/one" . High cholesterol  . Hypertension  . Migraines 04/17/2012 . Obesity  . Sleep apnea   CPAP . Stroke ([place])  . Type II diabetes mellitus ([place]) 08/28/2002 Past Surgical History: Past Surgical History: Procedure Laterality Date . ABDOMINAL AORTOGRAM W/LOWER EXTREMITY Left 05/27/2018  Procedure: ABDOMINAL AORTOGRAM W/LOWER EXTREMITY Runoff and Possible Intervention;  Surgeon: Waynetta Sandy, MD;  Location: [place] [facility];  Service: Cardiovascular;  Laterality: Left; . APPLICATION OF WOUND VAC Left 0000000  Procedure: APPLICATION OF WOUND VAC;  Surgeon: Waynetta Sandy, MD;  Location: [place];  Service: Vascular;  Laterality: Left; . BIOPSY  04/04/2018  Procedure: BIOPSY;  Surgeon: [name], MD;  Location: [facility] ENDOSCOPY;  Service: Endoscopy;; . CARDIAC CATHETERIZATION  ~ 2011 . COLONOSCOPY N/A 04/04/2018  Procedure: COLONOSCOPY;  Surgeon: [name], MD;  Location: [facility] ENDOSCOPY;  Service: Endoscopy;  Laterality: N/A; . LOWER EXTREMITY ANGIOGRAM Left 05/27/2018 . PERIPHERAL VASCULAR INTERVENTION Left 05/27/2018  Procedure: PERIPHERAL VASCULAR INTERVENTION;  Surgeon: Waynetta Sandy, MD;  Location: [place] [facility];  Service: Cardiovascular;  Laterality: Left; . POLYPECTOMY  04/04/2018  Procedure: POLYPECTOMY;  Surgeon: [name], MD;  Location: [facility] ENDOSCOPY;  Service: Endoscopy;; . TRANSMETATARSAL AMPUTATION Left 05/28/2018  Procedure: AMPUTATION TOES THREE, FOUR AND FIVE on left foot;  Surgeon: Waynetta Sandy, MD;  Location: [place];  Service: Vascular;  Laterality: Left; . WOUND DEBRIDEMENT Left 06/06/2018  Procedure: DEBRIDEMENT WOUND LEFT FOOT;  Surgeon: Waynetta Sandy, MD;  Location:  [facility] OR;  Service: Vascular;  Laterality: Left; HPI: 49 year old female with history of hemorrhagic stroke in 10/07/2018 with residual left sided weakness, COPD, hypertension, hyperlipidemia, diabetes mellitus type 2, OSA on CPAP, anxiety, depression, substance abuse (marijuana, crack cocaine) presented with slurred speech. MRI revealed acute small lacunar infarct in left corona radiata. BSE 1/21020 rec'd Dys 3, thin. MBS 09/18/18 oropharyngeal swallow WFL. Espohageal retention mid esophagus and w/u rec'd  Subjective: alert, cooperative Assessment / Plan / Recommendation CHL IP CLINICAL IMPRESSIONS 12/08/2019 Clinical Impression  Patient presents with mild oropharyngeal dysphagia. Oral phase is remarkable for decreased bolus cohesion, premature spillage to the vallecula and pyriform sinuses and lingual residue. When trialing a regular solid, the pt stated she was going to swallow the bolus before attempting to masticate, requiring Min cues to continue to fully masticate prior to  swallowing. Pharyngeal phase is remarkable for delayed swallow initiation at the pyriform sinuses and reduced laryngeal closure.  Patient's vallecular space was also noted to be small, making it difficult for the bolus to collect there prior to the swallow. Liquids would either spill over into the larynx or to the pyriform sinsues. The small vallecular space and reduced laryngeal closure ultimately resulted in penetration/aspiration. Aspiration (PAS 7 or 8) occurred during the swallow with thin liquids. As the pt was challenged by taking large consecutive sips, she aspirated, spit out what was remaining in her oral cavity and was noted with suspected nasopharyngeal regurgitation (nasal secretions and what appeared to be some barium expelled from her nasal cavity). The fluoro was not on during suspected nasopharyngeal regurgitation, therefore cannot confirm. A chin tuck was trialed to attempt to increase the space in the vallecula, however, this  was unsuccessful. Recommend Dys 2 diet, nectar thick liquids, meds crushed in puree, with full supervision. Ensure the pt is taking small, single sips at a time and is following general aspiration precautions.  SLP Visit Diagnosis Dysphagia, oropharyngeal phase (R13.12) Attention and concentration deficit following -- Frontal lobe and executive function deficit following -- Impact on safety and function Moderate aspiration risk   CHL IP TREATMENT RECOMMENDATION 12/08/2019 Treatment Recommendations Therapy as outlined in treatment plan below   Prognosis 12/08/2019 Prognosis for Safe Diet Advancement Good Barriers to Reach Goals -- Barriers/Prognosis Comment -- CHL IP DIET RECOMMENDATION 12/08/2019 SLP Diet Recommendations Dysphagia 2 (Fine chop) solids;Nectar thick liquid Liquid Administration via Cup;Straw Medication Administration Crushed with puree Compensations Minimize environmental distractions;Slow rate;Small sips/bites Postural Changes Remain semi-upright after after feeds/meals (Comment);Seated upright at 90 degrees   CHL IP OTHER RECOMMENDATIONS 12/08/2019 Recommended Consults -- Oral Care Recommendations Oral care BID Other Recommendations Prohibited food (jello, ice cream, thin soups);Order thickener from pharmacy;Remove water pitcher   CHL IP FOLLOW UP RECOMMENDATIONS 12/08/2019 Follow up Recommendations Skilled Nursing facility   [facility] IP FREQUENCY AND DURATION 12/08/2019 Speech Therapy Frequency (ACUTE ONLY) min 2x/week Treatment Duration 2 weeks      CHL IP ORAL PHASE 12/08/2019 Oral Phase Impaired Oral - Pudding Teaspoon -- Oral - Pudding Cup -- Oral - Honey Teaspoon -- Oral - Honey Cup -- Oral - Nectar Teaspoon -- Oral - Nectar Cup Lingual/palatal residue Oral - Nectar Straw Lingual/palatal residue Oral - Thin Teaspoon -- Oral - Thin Cup Decreased bolus cohesion;Premature spillage Oral - Thin Straw Decreased bolus cohesion;Premature spillage Oral - Puree WFL Oral - Mech Soft -- Oral - Regular Delayed oral  transit Oral - Multi-Consistency -- Oral - Pill -- Oral Phase - Comment --  CHL IP PHARYNGEAL PHASE 12/08/2019 Pharyngeal Phase Impaired Pharyngeal- Pudding Teaspoon -- Pharyngeal -- Pharyngeal- Pudding Cup -- Pharyngeal -- Pharyngeal- Honey Teaspoon -- Pharyngeal -- Pharyngeal- Honey Cup -- Pharyngeal -- Pharyngeal- Nectar Teaspoon -- Pharyngeal -- Pharyngeal- Nectar Cup Penetration/Aspiration during swallow;Reduced airway/laryngeal closure Pharyngeal Material enters airway, remains ABOVE vocal cords then ejected out Pharyngeal- Nectar Straw Penetration/Aspiration during swallow;Reduced airway/laryngeal closure Pharyngeal Material enters airway, remains ABOVE vocal cords then ejected out Pharyngeal- Thin Teaspoon -- Pharyngeal -- Pharyngeal- Thin Cup Penetration/Aspiration during swallow;Trace aspiration;Delayed swallow initiation-pyriform sinuses;Compensatory strategies attempted (with notebox);Other (Comment);Reduced airway/laryngeal closure Pharyngeal Material enters airway, passes BELOW cords without attempt by patient to eject out (silent aspiration) Pharyngeal- Thin Straw Penetration/Aspiration during swallow;Trace aspiration;Delayed swallow initiation-pyriform sinuses;Compensatory strategies attempted (with notebox);Other (Comment);Reduced airway/laryngeal closure Pharyngeal Material enters airway, passes BELOW cords without attempt by patient to eject out (silent aspiration) Pharyngeal-  Puree WFL Pharyngeal -- Pharyngeal- Mechanical Soft -- Pharyngeal -- Pharyngeal- Regular WFL Pharyngeal -- Pharyngeal- Multi-consistency -- Pharyngeal -- Pharyngeal- Pill -- Pharyngeal -- Pharyngeal Comment --  CHL IP CERVICAL ESOPHAGEAL PHASE 12/08/2019 Cervical Esophageal Phase WFL Pudding Teaspoon -- Pudding Cup -- Honey Teaspoon -- Honey Cup -- Nectar Teaspoon -- Nectar Cup -- Nectar Straw -- Thin Teaspoon -- Thin Cup -- Thin Straw -- Puree -- Mechanical Soft -- Regular -- Multi-consistency -- Pill -- Cervical Esophageal  Comment -- Osie Bond., M.A. CCC-SLP Acute Rehabilitation Services Pager [phone] Office [phone] 12/08/2019, 9:45 AM              ECHOCARDIOGRAM COMPLETE  Result Date: 12/07/2019    ECHOCARDIOGRAM REPORT   Patient Name:   ALIYAH HARTFORD Date of Exam: 12/07/2019 Medical Rec #:  [id number]         Height:       61.0 in Accession #:    [id number]        Weight:       195.0 lb Date of Birth:  Jun 04, 1971          BSA:          1.869 m Patient Age:    10 years          BP:           168/88 mmHg Patient Gender: F                 HR:           50 bpm. Exam Location:  Inpatient Procedure: 2D Echo Indications:    Stroke 434.91 / I163.9  History:        Patient has prior history of Echocardiogram examinations, most                 recent 06/24/2018. Signs/Symptoms:Dyspnea; Risk Factors:Tobacco                 abuse, Hypertension, Diabetes and Dyslipidemia. Cocaine abuse                 Acute renal failure                 Aortic valve insufficiency.  Sonographer:    Vikki Ports Turrentine Referring Phys: [id number] [place]  1. Left ventricular ejection fraction, by estimation, is 65 to 70%. The left ventricle has normal function. The left ventricle has no regional wall motion abnormalities. There is moderate concentric left ventricular hypertrophy. Left ventricular diastolic parameters are consistent with Grade I diastolic dysfunction (impaired relaxation). Elevated left ventricular end-diastolic pressure.  2. Right ventricular systolic function is normal. The right ventricular size is normal. Tricuspid regurgitation signal is inadequate for assessing PA pressure.  3. The mitral valve is normal in structure. Trivial mitral valve regurgitation. No evidence of mitral stenosis.  4. The aortic valve is tricuspid. Aortic valve regurgitation is mild to mdoerate. Aortic valve mean gradient measures 9.0 mmHg. Aortic valve peak gradient measures 16.4 mmHg. Aortic valve area, by VTI measures 1.53 cm. Very mild  aortic stenosis.  5. The inferior vena cava is normal in size with greater than 50% respiratory variability, suggesting right atrial pressure of 3 mmHg.  6. Left atrial size was moderately dilated. FINDINGS  Left Ventricle: Left ventricular ejection fraction, by estimation, is 65 to 70%. The left ventricle has normal function. The left ventricle has no regional wall motion abnormalities. The left ventricular internal cavity size was normal in size. There is  moderate concentric left ventricular hypertrophy. Left ventricular diastolic parameters are consistent with Grade I diastolic dysfunction (impaired relaxation). Elevated left ventricular end-diastolic pressure. Right Ventricle: The right ventricular size is normal. No increase in right ventricular wall thickness. Right ventricular systolic function is normal. Tricuspid regurgitation signal is inadequate for assessing PA pressure. Left Atrium: Left atrial size was moderately dilated. Right Atrium: Right atrial size was normal in size. Pericardium: There is no evidence of pericardial effusion. Mitral Valve: The mitral valve is normal in structure. Normal mobility of the mitral valve leaflets. Trivial mitral valve regurgitation. No evidence of mitral valve stenosis. Tricuspid Valve: The tricuspid valve is normal in structure. Tricuspid valve regurgitation is trivial. No evidence of tricuspid stenosis. Aortic Valve: The aortic valve is tricuspid. Aortic valve regurgitation is mild to moderate. Aortic regurgitation PHT measures 944 msec. There is moderate calcification of the aortic valve. Aortic valve mean gradient measures 9.0 mmHg. Aortic valve peak gradient measures 16.4 mmHg. Aortic valve area, by VTI measures 1.53 cm. Very mild aortic stenosis. Pulmonic Valve: The pulmonic valve was normal in structure. Pulmonic valve regurgitation is not visualized. No evidence of pulmonic stenosis. Aorta: The aortic root is normal in size and structure. Venous: The inferior  vena cava is normal in size with greater than 50% respiratory variability, suggesting right atrial pressure of 3 mmHg. IAS/Shunts: No atrial level shunt detected by color flow Doppler.  LEFT VENTRICLE PLAX 2D LVIDd:         3.60 cm  Diastology LVIDs:         2.20 cm  LV e' lateral:   4.03 cm/s LV PW:         1.40 cm  LV E/e' lateral: 19.3 LV IVS:        1.40 cm  LV e' medial:    5.22 cm/s LVOT diam:     1.70 cm  LV E/e' medial:  14.9 LV SV:         64 LV SV Index:   34 LVOT Area:     2.27 cm  RIGHT VENTRICLE RV S prime:     15.30 cm/s TAPSE (M-mode): 2.7 cm LEFT ATRIUM             Index       RIGHT ATRIUM           Index LA diam:        4.50 cm 2.41 cm/m  RA Area:     19.90 cm LA Vol (A2C):   91.5 ml 48.97 ml/m RA Volume:   51.10 ml  27.35 ml/m LA Vol (A4C):   75.3 ml 40.30 ml/m LA Biplane Vol: 85.9 ml 45.97 ml/m  AORTIC VALVE AV Area (Vmax):    1.48 cm AV Area (Vmean):   1.46 cm AV Area (VTI):     1.53 cm AV Vmax:           202.50 cm/s AV Vmean:          141.500 cm/s AV VTI:            0.416 m AV Peak Grad:      16.4 mmHg AV Mean Grad:      9.0 mmHg LVOT Vmax:         132.00 cm/s LVOT Vmean:        91.000 cm/s LVOT VTI:          0.280 m LVOT/AV VTI ratio: 0.67 AI PHT:  944 msec  AORTA Ao Root diam: 2.80 cm MITRAL VALVE MV Area (PHT): 2.48 cm     SHUNTS MV Decel Time: 306 msec     Systemic VTI:  0.28 m MV E velocity: 77.70 cm/s   Systemic Diam: 1.70 cm MV A velocity: 114.00 cm/s MV E/A ratio:  0.68 Fransico Him MD Electronically signed by Fransico Him MD Signature Date/Time: 12/07/2019/2:07:10 PM    Final    CT HEAD CODE STROKE WO CONTRAST  Result Date: 12/06/2019 CLINICAL DATA:  Code stroke. 49 year old female with slurred speech. History of prior stroke. EXAM: CT HEAD WITHOUT CONTRAST TECHNIQUE: Contiguous axial images were obtained from the base of the skull through the vertex without intravenous contrast. COMPARISON:  Head CT 06/26/2019. Brain MRI 09/02/2018. FINDINGS: Brain: Advanced  chronic cerebral white matter and deep gray nuclei hypodensity. No acute intracranial hemorrhage identified. No midline shift, mass effect, or evidence of intracranial mass lesion. No ventriculomegaly. Small chronic infarct in the left cerebellum. No cortically based acute infarct identified. Vascular: Calcified atherosclerosis at the skull base. No suspicious intracranial vascular hyperdensity. Skull: Stable hyperostosis. No acute osseous abnormality identified. Sinuses/Orbits: Visualized paranasal sinuses and mastoids are stable and well pneumatized. Other: Leftward gaze. Negative scalp soft tissues. ASPECTS [facility] Stroke Program Early CT Score) Total score (0-10 with 10 being normal): 10 IMPRESSION: 1. Stable CT appearance of advanced chronic small vessel disease since October. ASPECTS 10. 2. These results were communicated to Dr. Lorraine Lax at 5:37 pmon 4/10/2021by text page via the [facility] messaging system. Electronically Signed   By: Genevie Ann M.D.   On: 12/06/2019 17:38        Scheduled Meds: .  stroke: mapping our early stages of recovery book   Does not apply Once  . ARIPiprazole  15 mg Oral QHS  . aspirin EC  81 mg Oral Daily  . atorvastatin  40 mg Oral q1800  . chlorhexidine  15 mL Mouth Rinse BID  . clopidogrel  75 mg Oral Daily  . enoxaparin (LOVENOX) injection  40 mg Subcutaneous Daily  . ferrous sulfate  325 mg Oral Q breakfast  . insulin aspart  0-15 Units Subcutaneous TID WC  . insulin detemir  15 Units Subcutaneous BID  . mouth rinse  15 mL Mouth Rinse q12n4p  . pentoxifylline  400 mg Oral TID WC  . potassium chloride  40 mEq Oral Once   Continuous Infusions:         Aline August, MD [facility] 12/08/2019, 10:36 AM

## 2019-12-08 NOTE — Social Work (Signed)
CSW met with pt at bedside. CSW introduced self and explained her role at the hospital. Pt had family in the room, csw explained that family could step out of the room. Pt declined stating they where fine. CSW completed sbirt with pt. Pt scored a 0 on the scale and declined alcohol use.   Pt inquired about substance use. Pt stated she did marijuana and she had no intentions on stopping. CSW asked about cocaine use. Pt stated its something she does on occasion but does not have a problem. CSW offered resources and the pt declined.   Pts family member shared concerns about the pts substance use. Pt, family member and csw discussed the effects of substance use on the body and stroke recovery. At the end of the conversation pt was receptive to resources. Pt family member inquired about residential treatment programs. CSW highlighted those for family.   Emeterio Reeve, Latanya Presser, [place] Social Worker [phone]

## 2019-12-08 NOTE — Consult Note (Signed)
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  Per chart review patient lives with a roomate.  1 level apartment with level entry.No local family.  She used a quad cane prior to admission.  Presented 12/06/2019 with slurred speech.  Urine drug screen positive cocaine and marijuana, creatinine 1.31..  CT/MRI showed small acute lacunar infarct in the left corona radiata tracking into the posterior left lentiform.  No associated hemorrhage or mass-effect.  MRI negative for large vessel occlusion.  Echocardiogram with ejection fraction of [id number] grade 1 diastolic dysfunction.  Currently maintained on aspirin and Plavix for CVA prophylaxis.  Subcutaneous Lovenox for DVT prophylaxis.  Patient is n.p.o. with swallow study pending.  Therapy evaluations completed with recommendations of physical medicine rehab consult.   Review of Systems  Constitutional: Negative for chills and fever.  HENT: Negative for hearing loss.   Eyes: Negative for blurred vision and double vision.  Respiratory: Positive for shortness of breath.   Cardiovascular: Positive for leg swelling. Negative for chest pain and palpitations.  Gastrointestinal: Positive for constipation. Negative for heartburn, nausea and vomiting.  Genitourinary: Negative for dysuria, flank pain and hematuria.  Musculoskeletal: Positive for back pain and myalgias.  Skin: Negative for rash.  Neurological: Positive for headaches.  Psychiatric/Behavioral: Positive for depression. The patient has insomnia.        Anxiety  All other systems reviewed and are negative.  Past Medical History:  Diagnosis Date  .  Anxiety   . Chronic lower back pain 04/17/2012   "just got over some; catched when I walked"  . COPD (chronic obstructive pulmonary disease) ([place])   . Critical lower limb ischemia 05/27/2018  . Depression   . Headache(784.0) 04/17/2012   "~ qod; lately waking up in am w/one"  . High cholesterol   . Hypertension   . Migraines 04/17/2012  . Obesity   . Sleep apnea    CPAP  . Stroke ([place])   . Type II diabetes mellitus ([place]) 08/28/2002   Past Surgical History:  Procedure Laterality Date  . ABDOMINAL AORTOGRAM W/LOWER EXTREMITY Left 05/27/2018   Procedure: ABDOMINAL AORTOGRAM W/LOWER EXTREMITY Runoff and Possible Intervention;  Surgeon: Waynetta Sandy, MD;  Location: [place] [facility];  Service: Cardiovascular;  Laterality: Left;  . APPLICATION OF WOUND VAC Left 06/06/2018   Procedure: APPLICATION OF WOUND VAC;  Surgeon: Waynetta Sandy, MD;  Location: [place];  Service: Vascular;  Laterality: Left;  . BIOPSY  04/04/2018   Procedure: BIOPSY;  Surgeon: [name], MD;  Location: [facility] ENDOSCOPY;  Service: Endoscopy;;  . CARDIAC CATHETERIZATION  ~ 2011  . COLONOSCOPY N/A 04/04/2018   Procedure: COLONOSCOPY;  Surgeon: [name], MD;  Location: [facility] ENDOSCOPY;  Service: Endoscopy;  Laterality: N/A;  . LOWER EXTREMITY ANGIOGRAM Left 05/27/2018  . PERIPHERAL VASCULAR INTERVENTION Left 05/27/2018   Procedure: PERIPHERAL VASCULAR INTERVENTION;  Surgeon: Waynetta Sandy, MD;  Location: [place] [facility];  Service: Cardiovascular;  Laterality: Left;  . POLYPECTOMY  04/04/2018   Procedure: POLYPECTOMY;  Surgeon: [name], MD;  Location: [facility] ENDOSCOPY;  Service: Endoscopy;;  . TRANSMETATARSAL AMPUTATION Left 05/28/2018   Procedure: AMPUTATION TOES THREE, FOUR AND FIVE on left foot;  Surgeon: Waynetta Sandy, MD;  Location: [place];  Service: Vascular;  Laterality: Left;  . WOUND DEBRIDEMENT Left 06/06/2018   Procedure: DEBRIDEMENT WOUND LEFT FOOT;  Surgeon: Waynetta Sandy, MD;  Location: [facility] OR;  Service: Vascular;  Laterality: Left;   Family History  Problem Relation Age of Onset  . Stroke Brother 87  . Stroke Brother 2  . Heart attack Mother 78  . Stroke Father 20   Social History:  reports that she has quit smoking. Her smoking use included cigarettes. She has a 7.00 pack-year smoking history. She has never used smokeless tobacco. She reports current drug use. Drugs: Marijuana and "Crack" cocaine. She reports that she does not drink alcohol. Allergies:  Allergies  Allergen Reactions  . Morphine And Related Hives  . Metformin Diarrhea   Medications Prior to Admission  Medication Sig Dispense Refill  . ARIPiprazole (ABILIFY) 15 MG tablet Take 1 tablet (15 mg total) by mouth at bedtime. 30 tablet 0  . cloNIDine (CATAPRES) 0.3 MG tablet Take 0.3 mg by mouth 2 (two) times daily.    . ferrous sulfate 325 (65 FE) MG tablet Take 1 tablet (325 mg total) by mouth daily with breakfast. 30 tablet 0  . hydrALAZINE (APRESOLINE) 100 MG tablet Take 100 mg by mouth 3 (three) times daily.    . Insulin Detemir (LEVEMIR) 100 UNIT/ML Pen Inject 15 Units into the skin 2 (two) times daily. 15 mL 0  . lisinopril (ZESTRIL) 40 MG tablet Take 40 mg by mouth daily.    . pentoxifylline (TRENTAL) 400 MG CR tablet Take 1 tablet (400 mg total) by mouth 3 (three) times daily with meals. 90 tablet 0  . aspirin EC 81 MG EC tablet Take 1 tablet (81 mg total) by mouth daily. (Patient not taking: Reported on 12/06/2019)    . carvedilol (COREG) 25 MG tablet Take 1 tablet (25 mg total) by mouth 2 (two) times daily. (Patient not taking: Reported on 12/06/2019) 60 tablet 11  . cloNIDine (CATAPRES - DOSED IN MG/24 HR) 0.3 mg/24hr patch Place 1 patch (0.3 mg total) onto the skin once a week. (Patient not taking: Reported on 08/15/2019) 4 patch 12  . furosemide (LASIX) 40 MG tablet Take 1 tablet (40 mg total) by mouth daily. (Patient not taking: Reported on 12/06/2019) 30 tablet 0   . gabapentin (NEURONTIN) 100 MG capsule Take 1 capsule (100 mg total) by mouth 2 (two) times daily. (Patient not taking: Reported on 08/15/2019)    . potassium chloride SA (K-DUR,KLOR-CON) 20 MEQ tablet Take 1 tablet (20 mEq total) by mouth daily. (Patient not taking: Reported on 12/06/2019) 30 tablet 0    Home: Home Living Family/patient expects to be discharged to:: Private residence Living Arrangements: Other (Comment)(need more info; as of last year, living with [place]) Available Help at Discharge: Friend(s) Type of Home: Apartment Home Access: Level entry Home Layout: One level Bathroom Shower/Tub: Tub/shower unit Home Equipment: Cane - quad  Functional History: Prior Function Level of Independence: Needs assistance Gait / Transfers Assistance Needed: with quad cane Comments: need more info re: PLOF Functional Status:  Mobility: Bed Mobility Overal bed mobility: Needs Assistance Bed Mobility: Rolling, Sidelying to Sit, Sit to Supine Rolling: Min guard Sidelying to sit: Min assist Sit to supine: Mod assist General bed mobility comments: min handheld assist to come to sit; mod assist to help LEs back into bed Transfers Overall transfer level: Needs assistance Equipment used: 1 person hand held assist Transfers: Sit  to/from Stand, Risk manager Sit to Stand: Mod assist Stand pivot transfers: Mod assist General transfer comment: Light mod assist to rise and steady; took a few pivotal steps bed to [facility] and back      ADL:    Cognition: Cognition Overall Cognitive Status: No family/caregiver present to determine baseline cognitive functioning Orientation Level: Oriented X4 Cognition Arousal/Alertness: Awake/alert Behavior During Therapy: WFL for tasks assessed/performed Overall Cognitive Status: No family/caregiver present to determine baseline cognitive functioning  Blood pressure (!) 196/116, pulse 68, temperature 98 F (36.7 C), temperature source Oral,  resp. rate 20, height 5\' 1"  (1.549 m), weight 82 kg, SpO2 100 %. Physical Exam  Neurological:  Patient is alert.  Left facial weakness.  Speech is very dysarthric but intelligible.  Oriented x3 and follows commands. Mil left hemiparesis    Results for orders placed or performed during the hospital encounter of 12/06/19 (from the past 24 hour(s))  CBG monitoring, ED     Status: Abnormal   Collection Time: 12/07/19 12:15 PM  Result Value Ref Range   Glucose-Capillary 103 (H) 70 - 99 mg/dL  Urine rapid drug screen (hosp performed)     Status: Abnormal   Collection Time: 12/07/19  2:49 PM  Result Value Ref Range   Opiates NONE DETECTED NONE DETECTED   Cocaine POSITIVE (A) NONE DETECTED   Benzodiazepines NONE DETECTED NONE DETECTED   Amphetamines NONE DETECTED NONE DETECTED   Tetrahydrocannabinol POSITIVE (A) NONE DETECTED   Barbiturates NONE DETECTED NONE DETECTED  Urinalysis, Routine w reflex microscopic     Status: Abnormal   Collection Time: 12/07/19  2:50 PM  Result Value Ref Range   Color, Urine YELLOW YELLOW   APPearance HAZY (A) CLEAR   Specific Gravity, Urine 1.023 1.005 - 1.030   pH 5.0 5.0 - 8.0   Glucose, UA 50 (A) NEGATIVE mg/dL   Hgb urine dipstick NEGATIVE NEGATIVE   Bilirubin Urine NEGATIVE NEGATIVE   Ketones, ur 5 (A) NEGATIVE mg/dL   Protein, ur 30 (A) NEGATIVE mg/dL   Nitrite NEGATIVE NEGATIVE   Leukocytes,Ua NEGATIVE NEGATIVE   RBC / HPF 0-5 0 - 5 RBC/hpf   WBC, UA 0-5 0 - 5 WBC/hpf   Bacteria, UA RARE (A) NONE SEEN   Squamous Epithelial / LPF 0-5 0 - 5   Mucus PRESENT   Glucose, capillary     Status: None   Collection Time: 12/07/19  8:25 PM  Result Value Ref Range   Glucose-Capillary 97 70 - 99 mg/dL  Glucose, capillary     Status: None   Collection Time: 12/08/19 12:18 AM  Result Value Ref Range   Glucose-Capillary 83 70 - 99 mg/dL  CBC with Differential/Platelet     Status: Abnormal   Collection Time: 12/08/19  3:42 AM  Result Value Ref Range   WBC  6.1 4.0 - 10.5 K/uL   RBC 4.61 3.87 - 5.11 MIL/uL   Hemoglobin 10.9 (L) 12.0 - 15.0 g/dL   HCT 33.9 (L) 36.0 - 46.0 %   MCV 73.5 (L) 80.0 - 100.0 fL   MCH 23.6 (L) 26.0 - 34.0 pg   MCHC 32.2 30.0 - 36.0 g/dL   RDW 15.9 (H) 11.5 - 15.5 %   Platelets 238 150 - 400 K/uL   nRBC 0.0 0.0 - 0.2 %   Neutrophils Relative % 39 %   Neutro Abs 2.4 1.7 - 7.7 K/uL   Lymphocytes Relative 49 %   Lymphs Abs 3.0 0.7 - 4.0 K/uL  Monocytes Relative 9 %   Monocytes Absolute 0.6 0.1 - 1.0 K/uL   Eosinophils Relative 2 %   Eosinophils Absolute 0.1 0.0 - 0.5 K/uL   Basophils Relative 1 %   Basophils Absolute 0.1 0.0 - 0.1 K/uL   Immature Granulocytes 0 %   Abs Immature Granulocytes 0.01 0.00 - 0.07 K/uL  Comprehensive metabolic panel     Status: Abnormal   Collection Time: 12/08/19  3:42 AM  Result Value Ref Range   Sodium 142 135 - 145 mmol/L   Potassium 3.2 (L) 3.5 - 5.1 mmol/L   Chloride 107 98 - 111 mmol/L   CO2 23 22 - 32 mmol/L   Glucose, Bld 88 70 - 99 mg/dL   BUN 14 6 - 20 mg/dL   Creatinine, Ser 1.44 (H) 0.44 - 1.00 mg/dL   Calcium 8.9 8.9 - 10.3 mg/dL   Total Protein 6.6 6.5 - 8.1 g/dL   Albumin 3.1 (L) 3.5 - 5.0 g/dL   AST 12 (L) 15 - 41 U/L   ALT 8 0 - 44 U/L   Alkaline Phosphatase 51 38 - 126 U/L   Total Bilirubin 1.0 0.3 - 1.2 mg/dL   GFR calc non Af Amer 43 (L) >60 mL/min   GFR calc Af Amer 50 (L) >60 mL/min   Anion gap 12 5 - 15  Magnesium     Status: None   Collection Time: 12/08/19  3:42 AM  Result Value Ref Range   Magnesium 2.0 1.7 - 2.4 mg/dL  Glucose, capillary     Status: None   Collection Time: 12/08/19  4:18 AM  Result Value Ref Range   Glucose-Capillary 85 70 - 99 mg/dL  Glucose, capillary     Status: None   Collection Time: 12/08/19  7:30 AM  Result Value Ref Range   Glucose-Capillary 86 70 - 99 mg/dL   MR ANGIO HEAD WO CONTRAST  Result Date: 12/06/2019 CLINICAL DATA:  49 year old female with acute on chronic small vessel disease. EXAM: MRA HEAD WITHOUT  CONTRAST TECHNIQUE: Angiographic images of the Circle of Willis were obtained using MRA technique without intravenous contrast. COMPARISON:  Brain MRI today reported separately. Intracranial MRA 09/02/2018. FINDINGS: Chronically diminutive posterior circulation. Antegrade flow appears stable in diminutive distal vertebral arteries, the left terminates in PICA. The right PICA origin remains patent. The right vertebral supplies a patent but diminutive basilar artery. There is moderate to severe stenosis at the basilar artery origin which may be progressed from last year (series 1038, image 7). Fetal type bilateral PCA origins. The basilar terminates in the SCA is which remain patent. Bilateral fetal type PCA origins. Severe right PCA P1 and left PCA P2 stenoses are redemonstrated and stable. Stable antegrade flow in both ICA siphons. Normal posterior communicating artery origins. Mild to moderate bilateral siphon anterior genu/supraclinoid irregularity appears stable with mild stenosis. Carotid termini remain patent. MCA and ACA origins remain patent but there is new moderate stenosis at the left A1 origin (series 1030, image 10). MCA M1 segments and bifurcations remain patent with mild irregularity. Visible bilateral MCA and ACA branches are stable with mild irregularity. IMPRESSION: 1. Chronically age advanced intracranial atherosclerosis. Negative for large vessel occlusion. 2. Diminutive vertebrobasilar system on the basis of fetal type PCA origins, but evidence of hemodynamically significant stenosis at the Vertebrobasilar Junction which appears progressed from last year. Chronic severe stenoses of the Right P1 and Left P2. 3. Stable mild stenoses of the bilateral ICA anterior genu. New moderate stenosis  of the Left ACA origin. Electronically Signed   By: Genevie Ann M.D.   On: 12/06/2019 19:17   MR BRAIN WO CONTRAST  Result Date: 12/06/2019 CLINICAL DATA:  Code stroke. 49 year old female with slurred speech.  History of prior stroke. EXAM: MRI HEAD WITHOUT CONTRAST TECHNIQUE: Multiplanar, multiecho pulse sequences of the brain and surrounding structures were obtained without intravenous contrast. COMPARISON:  Head CT earlier today. Brain MRI 01/14/2018. FINDINGS: Brain: Oval 10 mm focus of restricted diffusion in the left corona radiata tracking into the posterior left lentiform (series 5, image 73). Faint T2 and FLAIR hyperintensity. No hemorrhage or mass effect. No other restricted diffusion. Small chronic infarct in the deep left cerebellar nuclei is new since 2019. Extensive chronic small vessel infarcts scattered in the bilateral basal ganglia, thalami, right corona radiata. There is Wallerian degeneration in the right brainstem. Scattered chronic microhemorrhages, with abundant hemosiderin in the posterior right lentiform. Additional scattered and confluent bilateral white matter T2 and FLAIR hyperintensity. No definite cortical encephalomalacia. No midline shift, mass effect, evidence of mass lesion, ventriculomegaly, extra-axial collection or acute intracranial hemorrhage. Cervicomedullary junction and pituitary are within normal limits. Vascular: Major intracranial vascular flow voids are stable since 2019. Skull and upper cervical spine: Negative visible cervical spine and bone marrow signal. Sinuses/Orbits: Negative orbits. Paranasal Visualized paranasal sinuses and mastoids are stable and well pneumatized. Other: Visible internal auditory structures appear normal. Scalp and face soft tissues appear negative. IMPRESSION: 1. Small acute lacunar infarct in the left corona radiata tracking into the posterior left lentiform. No associated hemorrhage or mass effect. 2. Underlying very severe chronic small vessel disease. 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 Electronically Signed   By: Genevie Ann M.D.   On: 12/06/2019 19:11   DG Swallowing Func-Speech Pathology  Result Date:  12/08/2019 Objective Swallowing Evaluation: Type of Study: MBS-Modified Barium Swallow Study  Patient Details Name: Mirah Sittler MRN: [id number] Date of Birth: [birth year] Today's Date: 12/08/2019 Time: SLP Start Time (ACUTE ONLY): 0805 -SLP Stop Time (ACUTE ONLY): 0826 SLP Time Calculation (min) (ACUTE ONLY): 21 min Past Medical History: Past Medical History: Diagnosis Date . Anxiety  . Chronic lower back pain 04/17/2012  "just got over some; catched when I walked" . COPD (chronic obstructive pulmonary disease) ([place])  . Critical lower limb ischemia 05/27/2018 . Depression  . Headache(784.0) 04/17/2012  "~ qod; lately waking up in am w/one" . High cholesterol  . Hypertension  . Migraines 04/17/2012 . Obesity  . Sleep apnea   CPAP . Stroke ([place])  . Type II diabetes mellitus ([place]) 08/28/2002 Past Surgical History: Past Surgical History: Procedure Laterality Date . ABDOMINAL AORTOGRAM W/LOWER EXTREMITY Left 05/27/2018  Procedure: ABDOMINAL AORTOGRAM W/LOWER EXTREMITY Runoff and Possible Intervention;  Surgeon: Waynetta Sandy, MD;  Location: [place] [facility];  Service: Cardiovascular;  Laterality: Left; . APPLICATION OF WOUND VAC Left 0000000  Procedure: APPLICATION OF WOUND VAC;  Surgeon: Waynetta Sandy, MD;  Location: [place];  Service: Vascular;  Laterality: Left; . BIOPSY  04/04/2018  Procedure: BIOPSY;  Surgeon: [name], MD;  Location: [facility] ENDOSCOPY;  Service: Endoscopy;; . CARDIAC CATHETERIZATION  ~ 2011 . COLONOSCOPY N/A 04/04/2018  Procedure: COLONOSCOPY;  Surgeon: [name], MD;  Location[facility] ENDOSCOPY;  Service: Endoscopy;  Laterality: N/A; . LOWER EXTREMITY ANGIOGRAM Left 05/27/2018 . PERIPHERAL VASCULAR INTERVENTION Left 05/27/2018  Procedure: PERIPHERAL VASCULAR INTERVENTION;  Surgeon: Waynetta Sandy, MD;  Location: [place] [facility];  Service: Cardiovascular;  Laterality: Left; .  POLYPECTOMY  04/04/2018  Procedure: POLYPECTOMY;  Surgeon: [name], MD;  Location: [facility]  ENDOSCOPY;  Service: Endoscopy;; . TRANSMETATARSAL AMPUTATION Left 05/28/2018  Procedure: AMPUTATION TOES THREE, FOUR AND FIVE on left foot;  Surgeon: Waynetta Sandy, MD;  Location: [place];  Service: Vascular;  Laterality: Left; . WOUND DEBRIDEMENT Left 06/06/2018  Procedure: DEBRIDEMENT WOUND LEFT FOOT;  Surgeon: Waynetta Sandy, MD;  Location: [facility] OR;  Service: Vascular;  Laterality: Left; HPI: 49 year old female with history of hemorrhagic stroke in 10/07/2018 with residual left sided weakness, COPD, hypertension, hyperlipidemia, diabetes mellitus type 2, OSA on CPAP, anxiety, depression, substance abuse (marijuana, crack cocaine) presented with slurred speech. MRI revealed acute small lacunar infarct in left corona radiata. BSE 1/21020 rec'd Dys 3, thin. MBS 09/18/18 oropharyngeal swallow WFL. Espohageal retention mid esophagus and w/u rec'd  Subjective: alert, cooperative Assessment / Plan / Recommendation CHL IP CLINICAL IMPRESSIONS 12/08/2019 Clinical Impression  Patient presents with mild oropharyngeal dysphagia. Oral phase is remarkable for decreased bolus cohesion, premature spillage to the vallecula and pyriform sinuses and lingual residue. When trialing a regular solid, the pt stated she was going to swallow the bolus before attempting to masticate, requiring Min cues to continue to fully masticate prior to swallowing. Pharyngeal phase is remarkable for delayed swallow initiation at the pyriform sinuses and reduced laryngeal closure.  Patient's vallecular space was also noted to be small, making it difficult for the bolus to collect there prior to the swallow. Liquids would either spill over into the larynx or to the pyriform sinsues. The small vallecular space and reduced laryngeal closure ultimately resulted in penetration/aspiration. Aspiration (PAS 7 or 8) occurred during the swallow with thin liquids. As the pt was challenged by taking large consecutive sips, she aspirated, spit out  what was remaining in her oral cavity and was noted with suspected nasopharyngeal regurgitation (nasal secretions and what appeared to be some barium expelled from her nasal cavity). The fluoro was not on during suspected nasopharyngeal regurgitation, therefore cannot confirm. A chin tuck was trialed to attempt to increase the space in the vallecula, however, this was unsuccessful. Recommend Dys 2 diet, nectar thick liquids, meds crushed in puree, with full supervision. Ensure the pt is taking small, single sips at a time and is following general aspiration precautions.  SLP Visit Diagnosis Dysphagia, oropharyngeal phase (R13.12) Attention and concentration deficit following -- Frontal lobe and executive function deficit following -- Impact on safety and function Moderate aspiration risk   CHL IP TREATMENT RECOMMENDATION 12/08/2019 Treatment Recommendations Therapy as outlined in treatment plan below   Prognosis 12/08/2019 Prognosis for Safe Diet Advancement Good Barriers to Reach Goals -- Barriers/Prognosis Comment -- CHL IP DIET RECOMMENDATION 12/08/2019 SLP Diet Recommendations Dysphagia 2 (Fine chop) solids;Nectar thick liquid Liquid Administration via Cup;Straw Medication Administration Crushed with puree Compensations Minimize environmental distractions;Slow rate;Small sips/bites Postural Changes Remain semi-upright after after feeds/meals (Comment);Seated upright at 90 degrees   CHL IP OTHER RECOMMENDATIONS 12/08/2019 Recommended Consults -- Oral Care Recommendations Oral care BID Other Recommendations Prohibited food (jello, ice cream, thin soups);Order thickener from pharmacy;Remove water pitcher   CHL IP FOLLOW UP RECOMMENDATIONS 12/08/2019 Follow up Recommendations Skilled Nursing facility   [facility] IP FREQUENCY AND DURATION 12/08/2019 Speech Therapy Frequency (ACUTE ONLY) min 2x/week Treatment Duration 2 weeks      CHL IP ORAL PHASE 12/08/2019 Oral Phase Impaired Oral - Pudding Teaspoon -- Oral - Pudding Cup --  Oral - Honey Teaspoon -- Oral - Honey Cup -- Oral - Nectar  Teaspoon -- Oral - Nectar Cup Lingual/palatal residue Oral - Nectar Straw Lingual/palatal residue Oral - Thin Teaspoon -- Oral - Thin Cup Decreased bolus cohesion;Premature spillage Oral - Thin Straw Decreased bolus cohesion;Premature spillage Oral - Puree WFL Oral - Mech Soft -- Oral - Regular Delayed oral transit Oral - Multi-Consistency -- Oral - Pill -- Oral Phase - Comment --  CHL IP PHARYNGEAL PHASE 12/08/2019 Pharyngeal Phase Impaired Pharyngeal- Pudding Teaspoon -- Pharyngeal -- Pharyngeal- Pudding Cup -- Pharyngeal -- Pharyngeal- Honey Teaspoon -- Pharyngeal -- Pharyngeal- Honey Cup -- Pharyngeal -- Pharyngeal- Nectar Teaspoon -- Pharyngeal -- Pharyngeal- Nectar Cup Penetration/Aspiration during swallow;Reduced airway/laryngeal closure Pharyngeal Material enters airway, remains ABOVE vocal cords then ejected out Pharyngeal- Nectar Straw Penetration/Aspiration during swallow;Reduced airway/laryngeal closure Pharyngeal Material enters airway, remains ABOVE vocal cords then ejected out Pharyngeal- Thin Teaspoon -- Pharyngeal -- Pharyngeal- Thin Cup Penetration/Aspiration during swallow;Trace aspiration;Delayed swallow initiation-pyriform sinuses;Compensatory strategies attempted (with notebox);Other (Comment);Reduced airway/laryngeal closure Pharyngeal Material enters airway, passes BELOW cords without attempt by patient to eject out (silent aspiration) Pharyngeal- Thin Straw Penetration/Aspiration during swallow;Trace aspiration;Delayed swallow initiation-pyriform sinuses;Compensatory strategies attempted (with notebox);Other (Comment);Reduced airway/laryngeal closure Pharyngeal Material enters airway, passes BELOW cords without attempt by patient to eject out (silent aspiration) Pharyngeal- Puree WFL Pharyngeal -- Pharyngeal- Mechanical Soft -- Pharyngeal -- Pharyngeal- Regular WFL Pharyngeal -- Pharyngeal- Multi-consistency -- Pharyngeal --  Pharyngeal- Pill -- Pharyngeal -- Pharyngeal Comment --  CHL IP CERVICAL ESOPHAGEAL PHASE 12/08/2019 Cervical Esophageal Phase WFL Pudding Teaspoon -- Pudding Cup -- Honey Teaspoon -- Honey Cup -- Nectar Teaspoon -- Nectar Cup -- Nectar Straw -- Thin Teaspoon -- Thin Cup -- Thin Straw -- Puree -- Mechanical Soft -- Regular -- Multi-consistency -- Pill -- Cervical Esophageal Comment -- Osie Bond., M.A. CCC-SLP Acute Rehabilitation Services Pager [phone] Office [phone] 12/08/2019, 9:45 AM              ECHOCARDIOGRAM COMPLETE  Result Date: 12/07/2019    ECHOCARDIOGRAM REPORT   Patient Name:   DEZERA BUIE Date of Exam: 12/07/2019 Medical Rec #:  [id number]         Height:       61.0 in Accession #:    [id number]        Weight:       195.0 lb Date of Birth:  05-25-1971          BSA:          1.869 m Patient Age:    41 years          BP:           168/88 mmHg Patient Gender: F                 HR:           50 bpm. Exam Location:  Inpatient Procedure: 2D Echo Indications:    Stroke 434.91 / I163.9  History:        Patient has prior history of Echocardiogram examinations, most                 recent 06/24/2018. Signs/Symptoms:Dyspnea; Risk Factors:Tobacco                 abuse, Hypertension, Diabetes and Dyslipidemia. Cocaine abuse                 Acute renal failure                 Aortic valve insufficiency.  Sonographer:    Vikki Ports  Turrentine Referring Phys: [id number] VASUNDHRA RATHORE IMPRESSIONS  1. Left ventricular ejection fraction, by estimation, is 65 to 70%. The left ventricle has normal function. The left ventricle has no regional wall motion abnormalities. There is moderate concentric left ventricular hypertrophy. Left ventricular diastolic parameters are consistent with Grade I diastolic dysfunction (impaired relaxation). Elevated left ventricular end-diastolic pressure.  2. Right ventricular systolic function is normal. The right ventricular size is normal. Tricuspid regurgitation signal is  inadequate for assessing PA pressure.  3. The mitral valve is normal in structure. Trivial mitral valve regurgitation. No evidence of mitral stenosis.  4. The aortic valve is tricuspid. Aortic valve regurgitation is mild to mdoerate. Aortic valve mean gradient measures 9.0 mmHg. Aortic valve peak gradient measures 16.4 mmHg. Aortic valve area, by VTI measures 1.53 cm. Very mild aortic stenosis.  5. The inferior vena cava is normal in size with greater than 50% respiratory variability, suggesting right atrial pressure of 3 mmHg.  6. Left atrial size was moderately dilated. FINDINGS  Left Ventricle: Left ventricular ejection fraction, by estimation, is 65 to 70%. The left ventricle has normal function. The left ventricle has no regional wall motion abnormalities. The left ventricular internal cavity size was normal in size. There is  moderate concentric left ventricular hypertrophy. Left ventricular diastolic parameters are consistent with Grade I diastolic dysfunction (impaired relaxation). Elevated left ventricular end-diastolic pressure. Right Ventricle: The right ventricular size is normal. No increase in right ventricular wall thickness. Right ventricular systolic function is normal. Tricuspid regurgitation signal is inadequate for assessing PA pressure. Left Atrium: Left atrial size was moderately dilated. Right Atrium: Right atrial size was normal in size. Pericardium: There is no evidence of pericardial effusion. Mitral Valve: The mitral valve is normal in structure. Normal mobility of the mitral valve leaflets. Trivial mitral valve regurgitation. No evidence of mitral valve stenosis. Tricuspid Valve: The tricuspid valve is normal in structure. Tricuspid valve regurgitation is trivial. No evidence of tricuspid stenosis. Aortic Valve: The aortic valve is tricuspid. Aortic valve regurgitation is mild to moderate. Aortic regurgitation PHT measures 944 msec. There is moderate calcification of the aortic valve.  Aortic valve mean gradient measures 9.0 mmHg. Aortic valve peak gradient measures 16.4 mmHg. Aortic valve area, by VTI measures 1.53 cm. Very mild aortic stenosis. Pulmonic Valve: The pulmonic valve was normal in structure. Pulmonic valve regurgitation is not visualized. No evidence of pulmonic stenosis. Aorta: The aortic root is normal in size and structure. Venous: The inferior vena cava is normal in size with greater than 50% respiratory variability, suggesting right atrial pressure of 3 mmHg. IAS/Shunts: No atrial level shunt detected by color flow Doppler.  LEFT VENTRICLE PLAX 2D LVIDd:         3.60 cm  Diastology LVIDs:         2.20 cm  LV e' lateral:   4.03 cm/s LV PW:         1.40 cm  LV E/e' lateral: 19.3 LV IVS:        1.40 cm  LV e' medial:    5.22 cm/s LVOT diam:     1.70 cm  LV E/e' medial:  14.9 LV SV:         64 LV SV Index:   34 LVOT Area:     2.27 cm  RIGHT VENTRICLE RV S prime:     15.30 cm/s TAPSE (M-mode): 2.7 cm LEFT ATRIUM             Index  RIGHT ATRIUM           Index LA diam:        4.50 cm 2.41 cm/m  RA Area:     19.90 cm LA Vol (A2C):   91.5 ml 48.97 ml/m RA Volume:   51.10 ml  27.35 ml/m LA Vol (A4C):   75.3 ml 40.30 ml/m LA Biplane Vol: 85.9 ml 45.97 ml/m  AORTIC VALVE AV Area (Vmax):    1.48 cm AV Area (Vmean):   1.46 cm AV Area (VTI):     1.53 cm AV Vmax:           202.50 cm/s AV Vmean:          141.500 cm/s AV VTI:            0.416 m AV Peak Grad:      16.4 mmHg AV Mean Grad:      9.0 mmHg LVOT Vmax:         132.00 cm/s LVOT Vmean:        91.000 cm/s LVOT VTI:          0.280 m LVOT/AV VTI ratio: 0.67 AI PHT:            944 msec  AORTA Ao Root diam: 2.80 cm MITRAL VALVE MV Area (PHT): 2.48 cm     SHUNTS MV Decel Time: 306 msec     Systemic VTI:  0.28 m MV E velocity: 77.70 cm/s   Systemic Diam: 1.70 cm MV A velocity: 114.00 cm/s MV E/A ratio:  0.68 Fransico Him MD Electronically signed by Fransico Him MD Signature Date/Time: 12/07/2019/2:07:10 PM    Final    CT HEAD  CODE STROKE WO CONTRAST  Result Date: 12/06/2019 CLINICAL DATA:  Code stroke. 49 year old female with slurred speech. History of prior stroke. EXAM: CT HEAD WITHOUT CONTRAST TECHNIQUE: Contiguous axial images were obtained from the base of the skull through the vertex without intravenous contrast. COMPARISON:  Head CT 06/26/2019. Brain MRI 09/02/2018. FINDINGS: Brain: Advanced chronic cerebral white matter and deep gray nuclei hypodensity. No acute intracranial hemorrhage identified. No midline shift, mass effect, or evidence of intracranial mass lesion. No ventriculomegaly. Small chronic infarct in the left cerebellum. No cortically based acute infarct identified. Vascular: Calcified atherosclerosis at the skull base. No suspicious intracranial vascular hyperdensity. Skull: Stable hyperostosis. No acute osseous abnormality identified. Sinuses/Orbits: Visualized paranasal sinuses and mastoids are stable and well pneumatized. Other: Leftward gaze. Negative scalp soft tissues. ASPECTS [facility] Stroke Program Early CT Score) Total score (0-10 with 10 being normal): 10 IMPRESSION: 1. Stable CT appearance of advanced chronic small vessel disease since October. ASPECTS 10. 2. These results were communicated to Dr. Lorraine Lax at 5:37 pmon 4/10/2021by text page via the [facility] messaging system. Electronically Signed   By: Genevie Ann M.D.   On: 12/06/2019 17:38     Assessment/Plan: Diagnosis: left corona radiate infarct into the posterior left lentiform in a sedentary patient with hx of prior cva/left hemiparesis 1. Does the need for close, 24 hr/day medical supervision in concert with the patient's rehab needs make it unreasonable for this patient to be served in a less intensive setting? Yes 2. Co-Morbidities requiring supervision/potential complications: COPD, PAD with toe amps, residual left hemiparesis after prior cva, polysubstance abus 3. Due to bladder management, bowel management, safety, skin/wound care, disease  management, medication administration, pain management and patient education, does the patient require 24 hr/day rehab nursing? Yes 4. Does the patient require coordinated care of a  physician, rehab nurse, therapy disciplines of PT, OT, SLP to address physical and functional deficits in the context of the above medical diagnosis(es)? Yes Addressing deficits in the following areas: balance, endurance, locomotion, strength, transferring, bowel/bladder control, bathing, dressing, feeding, grooming, toileting, cognition, speech, swallowing and psychosocial support 5. Can the patient actively participate in an intensive therapy program of at least 3 hrs of therapy per day at least 5 days per week? Yes 6. The potential for patient to make measurable gains while on inpatient rehab is good 7. Anticipated functional outcomes upon discharge from inpatient rehab are supervision and min assist  with PT, supervision and min assist with OT, supervision and min assist with SLP. 8. Estimated rehab length of stay to reach the above functional goals is: 16-21 days 9. Anticipated discharge destination: Home 10. Overall Rehab/Functional Prognosis: good  RECOMMENDATIONS: This patient's condition is appropriate for continued rehabilitative care in the following setting: CIR Patient has agreed to participate in recommended program. Potentially Note that insurance prior authorization may be required for reimbursement for recommended care.  Comment: will need assistance from friends,etc at discharge from inpatient rehab. Rehab Admissions Coordinator to follow up.  Thanks,  Meredith Staggers, MD, FAAPMR  I have examined pertinent labs and radiographic images. I have reviewed and concur with the physician assistant's documentation above.     Lavon Paganini Angiulli, PA-C 12/08/2019

## 2019-12-08 NOTE — Progress Notes (Signed)
Physical Therapy Treatment Patient Details Name: Diamond Rice MRN: [id number] DOB: 1971/08/14 Today's Date: 12/08/2019    History of Present Illness Derrica Rice is an 49 y.o. female with PMH hemorrhagic stroke in January 2020 ( residual left side weakness), COPD ( not on baseline oxygen, HTN, HLD, and DM2, who presented to [facility] ED as a code stroke for slurred speech. Small acute lacunar infarct in the left corona radiata tracking into the posterior left lentiform, and Underlying very severe chronic small vessel disease. 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     PT Comments    Pt's BPs are elevated in supine, attempted sitting to see if they would trend down, but they did not (220/110 in sitting EOB).  Pt needed to use the bathroom, so brought BSC for stand pivot to limit mobility with BPs that high.  RN aware and getting BP meds re-started.  OT to come see pt later and attempt more mobility once BP is better.  PT will continue to follow acutely for safe mobility progression.   Follow Up Recommendations  CIR     Equipment Recommendations  None recommended by PT    Recommendations for Other Services   NA     Precautions / Restrictions Precautions Precautions: Fall Precaution Comments: left sided weakness    Mobility  Bed Mobility Overal bed mobility: Needs Assistance Bed Mobility: Supine to Sit     Supine to sit: Supervision     General bed mobility comments: supervision for safety, extra time given for pt to complete task without assist.,   Transfers Overall transfer level: Needs assistance Equipment used: 1 person hand held assist Transfers: Sit to/from Stand;Stand Pivot Transfers Sit to Stand: Min guard Stand pivot transfers: Min guard       General transfer comment: Min guard assist for safety and balance to transfer to [facility] and back to bed initially to her right then back to her left without signs of buckling over left leg.  BP too high  right now for OOB time, but OT to come later today.   Ambulation/Gait             General Gait Details: not attempted due to high BP in sitting 220/110.  Pt is stable enough to try later if BP meds are effective.  OT messaged.        Modified Rankin (Stroke Patients Only) Modified Rankin (Stroke Patients Only) Pre-Morbid Rankin Score: Slight disability Modified Rankin: Moderately severe disability     Balance Overall balance assessment: Needs assistance Sitting-balance support: Feet supported;No upper extremity supported Sitting balance-Leahy Scale: Fair     Standing balance support: Single extremity supported Standing balance-Leahy Scale: Poor Standing balance comment: needs external support of one hand in standing.                             Cognition Arousal/Alertness: Awake/alert Behavior During Therapy: WFL for tasks assessed/performed Overall Cognitive Status: History of cognitive impairments - at baseline                                 General Comments: Pt quiet, speaks when spoken to, follows basic commands well given increased time.               Pertinent Vitals/Pain Pain Assessment: No/denies pain           PT Goals (  current goals can now be found in the care plan section) Acute Rehab PT Goals Patient Stated Goal: Did not state reports nausea today Progress towards PT goals: Not progressing toward goals - comment(due to elevated BP)    Frequency    Min 4X/week      PT Plan Current plan remains appropriate       AM-PAC PT "6 Clicks" Mobility   Outcome Measure  Help needed turning from your back to your side while in a flat bed without using bedrails?: None Help needed moving from lying on your back to sitting on the side of a flat bed without using bedrails?: None Help needed moving to and from a bed to a chair (including a wheelchair)?: A Little Help needed standing up from a chair using your arms (e.g.,  wheelchair or bedside chair)?: A Little Help needed to walk in hospital room?: A Lot Help needed climbing 3-5 steps with a railing? : A Lot 6 Click Score: 18    End of Session Equipment Utilized During Treatment: Gait belt Activity Tolerance: Treatment limited secondary to medical complications (Comment)(limited by high BP) Patient left: in bed;with call bell/phone within reach;with bed alarm set;with nursing/sitter in room Nurse Communication: Mobility status;Other (comment)(BP, nausea) PT Visit Diagnosis: Unsteadiness on feet (R26.81);Other abnormalities of gait and mobility (R26.89);Hemiplegia and hemiparesis Hemiplegia - Right/Left: Left Hemiplegia - dominant/non-dominant: Non-dominant Hemiplegia - caused by: Cerebral infarction     Time: 1151-1207 PT Time Calculation (min) (ACUTE ONLY): 16 min  Charges:  $Therapeutic Activity: 8-22 mins                    Verdene Lennert, PT, DPT  Acute Rehabilitation [phone] pager #[phone] office     12/08/2019, 12:14 PM

## 2019-12-08 NOTE — NC FL2 (Signed)
[place] LEVEL OF CARE SCREENING TOOL     IDENTIFICATION  [name] Birthdate: 10/19/1970 Sex: female Admission Date (Current Location): 12/06/2019  [facility] and [place] Number:  Herbalist and Address:  The . [facility], [place] [address] [place], [place] [address]      Provider Number: [id number]  Attending Physician Name and Address:  Aline August, MD  Relative Name and Phone Number:       Current Level of Care: Hospital Recommended Level of Care: [place] Prior Approval Number:    Date Approved/Denied:   PASRR Number: under review  Discharge Plan: SNF    Current Diagnoses: Patient Active Problem List   Diagnosis Date Noted  . Acute CVA (cerebrovascular accident) ([place]) 12/06/2019  . AKI (acute kidney injury) ([place]) 12/06/2019  . LVH (left ventricular hypertrophy) 08/10/2019  . Snoring 02/19/2019  . Hemorrhagic stroke ([place])   . Diastolic dysfunction   . Type 2 diabetes mellitus ([place])   . Anemia of chronic disease   . Chronic obstructive pulmonary disease ([place])   . ICH (intracerebral hemorrhage) ([place]) 08/30/2018  . Hypertensive heart disease without heart failure 07/04/2018  . Nonrheumatic aortic valve insufficiency 07/04/2018  . Esophageal abnormality   . Class 2 severe obesity due to excess calories with serious comorbidity and body mass index (BMI) of 35.0 to 35.9 in adult [facility])   . Peripheral vascular disease ([place])   . History of osteomyelitis   . History of amputation of lesser toe ([place])   . Pulmonary embolism ([place]) 06/22/2018  . Microcytic anemia 06/17/2018  . At risk for adverse drug reaction 06/11/2018  . Critical lower limb ischemia 05/27/2018  . GI bleeding 04/02/2018  . Acute renal failure (ARF) ([place]) 04/02/2018  . Hypokalemia 04/02/2018  . Polysubstance abuse ([place]) 04/02/2018  . Diabetic foot ulcer ([place]) 04/02/2018  . Cannabis use disorder, moderate, dependence ([place])  05/19/2017  . Cocaine use disorder, moderate, dependence ([place]) 05/19/2017  . Schizoaffective disorder, bipolar type ([place]) 10/07/2015  . OSA (obstructive sleep apnea) 06/09/2015  . Vitamin D deficiency 01/06/2015  . Bilateral knee pain 01/05/2015  . Numbness and tingling in right hand 01/05/2015  . Insomnia 12/07/2014  . Right-sided lacunar infarction ([place]) 09/15/2014  . Left hemiparesis ([place]) 09/15/2014  . Dyspnea   . Back pain 09/11/2013  . Psychoactive substance-induced organic mood disorder ([place]) 05/28/2013  . Cocaine abuse with cocaine-induced mood disorder ([place]) 05/28/2013  . Cannabis dependence with cannabis-induced anxiety disorder ([place]) 05/28/2013  . Morbid obesity with BMI of 45.0-49.9, adult ([place]) 04/25/2012  . Chest pain 04/17/2012  . Hypertension 04/17/2012  . DM (diabetes mellitus) with peripheral vascular complication ([place]) [id number]  . Hyperlipidemia 04/17/2012  . Tobacco use 04/17/2012    Orientation RESPIRATION BLADDER Height & Weight     Self, Time, Situation, Place  Normal Continent Weight: 82 kg Height:  5\' 1"  (154.9 cm)  BEHAVIORAL SYMPTOMS/MOOD NEUROLOGICAL BOWEL NUTRITION STATUS      Continent Diet(heart healthy/ carb modified with nectar thick liquids)  AMBULATORY STATUS COMMUNICATION OF NEEDS Skin   Extensive Assist Verbally Normal                       Personal Care Assistance Level of Assistance  Bathing, Feeding, Dressing Bathing Assistance: Limited assistance Feeding assistance: Limited assistance Dressing Assistance: Maximum assistance     Functional Limitations Info  Sight, Hearing, Speech Sight Info: Adequate Hearing Info: Adequate Speech Info:  Impaired    SPECIAL CARE FACTORS FREQUENCY  PT (By licensed PT), OT (By licensed OT), Speech therapy     PT Frequency: 5x/wk OT Frequency: 5x/wk     Speech Therapy Frequency: 5x/wk      Contractures Contractures Info: Not present    Additional Factors Info  Code Status,  Allergies, Psychotropic, Insulin Sliding Scale Code Status Info: Full Allergies Info: morphine, metformin Psychotropic Info: Abilify 15 mg daily Insulin Sliding Scale Info: Novolog 0-15 units SQ three times a day/ Levemir 15 mg SQ bid       Current Medications (12/08/2019):  This is the current hospital active medication list Current Facility-Administered Medications  Medication Dose Route Frequency Provider Last Rate Last Admin  .  stroke: mapping our early stages of recovery book   Does not apply Once Shela Leff, MD      . acetaminophen (TYLENOL) tablet 650 mg  650 mg Oral Q4H PRN Shela Leff, MD       Or  . acetaminophen (TYLENOL) 160 MG/5ML solution 650 mg  650 mg Per Tube Q4H PRN Shela Leff, MD       Or  . acetaminophen (TYLENOL) suppository 650 mg  650 mg Rectal Q4H PRN Shela Leff, MD      . ARIPiprazole (ABILIFY) tablet 15 mg  15 mg Oral QHS Shela Leff, MD      . aspirin EC tablet 81 mg  81 mg Oral Daily Shela Leff, MD   81 mg at 12/08/19 0857  . atorvastatin (LIPITOR) tablet 40 mg  40 mg Oral q1800 Shela Leff, MD      . chlorhexidine (PERIDEX) 0.12 % solution 15 mL  15 mL Mouth Rinse BID Aline August, MD   15 mL at 12/08/19 0857  . cloNIDine (CATAPRES) tablet 0.3 mg  0.3 mg Oral BID Burnetta Sabin L, NP      . clopidogrel (PLAVIX) tablet 75 mg  75 mg Oral Daily Shela Leff, MD   75 mg at 12/08/19 0857  . enoxaparin (LOVENOX) injection 40 mg  40 mg Subcutaneous Daily Shela Leff, MD   40 mg at 12/08/19 0858  . ferrous sulfate tablet 325 mg  325 mg Oral Q breakfast Shela Leff, MD   325 mg at 12/08/19 0857  . hydrALAZINE (APRESOLINE) tablet 100 mg  100 mg Oral TID Donzetta Starch, NP   100 mg at 12/08/19 1205  . insulin aspart (novoLOG) injection 0-15 Units  0-15 Units Subcutaneous TID WC Shela Leff, MD      . insulin detemir (LEVEMIR) injection 15 Units  15 Units Subcutaneous BID Shela Leff, MD    15 Units at 12/08/19 1206  . ipratropium-albuterol (DUONEB) 0.5-2.5 (3) MG/3ML nebulizer solution 3 mL  3 mL Nebulization Q6H PRN Shela Leff, MD      . lisinopril (ZESTRIL) tablet 40 mg  40 mg Oral Daily Burnetta Sabin L, NP   40 mg at 12/08/19 1205  . MEDLINE mouth rinse  15 mL Mouth Rinse q12n4p Aline August, MD   15 mL at 12/08/19 1208  . pentoxifylline (TRENTAL) CR tablet 400 mg  400 mg Oral TID WC Shela Leff, MD   400 mg at 12/08/19 1213  . potassium chloride SA (KLOR-CON) CR tablet 40 mEq  40 mEq Oral Q4H Aline August, MD   40 mEq at 12/08/19 1206     Discharge Medications: Please see discharge summary for a list of discharge medications.  Relevant Imaging Results:  Relevant Lab Results:  Additional Information SS#: [id number]  Pollie Friar, RN

## 2019-12-09 LAB — GLUCOSE, CAPILLARY
Glucose-Capillary: 103 mg/dL — ABNORMAL HIGH (ref 70–99)
Glucose-Capillary: 83 mg/dL (ref 70–99)
Glucose-Capillary: 101 mg/dL — ABNORMAL HIGH (ref 70–99)
Glucose-Capillary: 104 mg/dL — ABNORMAL HIGH (ref 70–99)

## 2019-12-09 LAB — CBC WITH DIFFERENTIAL/PLATELET
Abs Immature Granulocytes: 0.02 10*3/uL (ref 0.00–0.07)
Basophils Absolute: 0.1 10*3/uL (ref 0.0–0.1)
Basophils Relative: 1 %
Eosinophils Absolute: 0.1 10*3/uL (ref 0.0–0.5)
Eosinophils Relative: 1 %
HCT: 37.4 % (ref 36.0–46.0)
Hemoglobin: 11.7 g/dL — ABNORMAL LOW (ref 12.0–15.0)
Immature Granulocytes: 0 %
Lymphocytes Relative: 43 %
Lymphs Abs: 3 10*3/uL (ref 0.7–4.0)
MCH: 22.9 pg — ABNORMAL LOW (ref 26.0–34.0)
MCHC: 31.3 g/dL (ref 30.0–36.0)
MCV: 73 fL — ABNORMAL LOW (ref 80.0–100.0)
Monocytes Absolute: 0.7 10*3/uL (ref 0.1–1.0)
Monocytes Relative: 10 %
Neutro Abs: 3.1 10*3/uL (ref 1.7–7.7)
Neutrophils Relative %: 45 %
Platelets: 263 10*3/uL (ref 150–400)
RBC: 5.12 MIL/uL — ABNORMAL HIGH (ref 3.87–5.11)
RDW: 15.9 % — ABNORMAL HIGH (ref 11.5–15.5)
WBC: 7 10*3/uL (ref 4.0–10.5)
nRBC: 0 % (ref 0.0–0.2)

## 2019-12-09 LAB — BASIC METABOLIC PANEL
Anion gap: 10 (ref 5–15)
BUN: 12 mg/dL (ref 6–20)
CO2: 21 mmol/L — ABNORMAL LOW (ref 22–32)
Calcium: 9 mg/dL (ref 8.9–10.3)
Chloride: 110 mmol/L (ref 98–111)
Creatinine, Ser: 1.36 mg/dL — ABNORMAL HIGH (ref 0.44–1.00)
GFR calc Af Amer: 54 mL/min — ABNORMAL LOW (ref >60)
GFR calc non Af Amer: 46 mL/min — ABNORMAL LOW (ref >60)
Glucose, Bld: 100 mg/dL — ABNORMAL HIGH (ref 70–99)
Potassium: 3.8 mmol/L (ref 3.5–5.1)
Sodium: 141 mmol/L (ref 135–145)

## 2019-12-09 LAB — MAGNESIUM: Magnesium: 2.1 mg/dL (ref 1.7–2.4)

## 2019-12-09 LAB — SARS CORONAVIRUS 2 (TAT 6-24 HRS): SARS Coronavirus 2: NEGATIVE

## 2019-12-09 MED ORDER — PHENOL 1.4 % MT LIQD
1.0000 | OROMUCOSAL | Status: DC | PRN
  Filled 2019-12-09: qty 177

## 2019-12-09 NOTE — Progress Notes (Signed)
Inpatient Rehab Admissions:  Inpatient Rehab Consult received.  I met with patient at the bedside for rehabilitation assessment and to discuss goals and expectations of an inpatient rehab admission.  She is open to subacute rehab (SNF or IRF).  Note mobilizing at min guard/supervision level for transfers and may be too high level for our program, per TOC note limited support at home and pt confirms her room mate may be able to help "some".  Will see how she does with PT today to evaluate for candidacy for [facility] CIR.   Signed: Shann Medal, PT, DPT Admissions Coordinator [phone] 12/09/19  12:09 PM

## 2019-12-09 NOTE — TOC Progression Note (Signed)
Transition of Care [facility]) - Progression Note    Patient Details  Name: Diamond Rice MRN: [id number] Date of Birth: Jan 07, 1971  [facility] CM/SW Contact  Pollie Friar, RN Phone Number: 12/09/2019, 12:11 PM  Clinical Narrative:    Pt asking to d/c to a SNF facility closer to her family in [place], [place]. CM has faxed her information to [employer] in [place] and [place] per pt request.  TOC awaiting to hear if facilities can offer pt a bed.    Expected Discharge Plan: IP Rehab Facility Barriers to Discharge: Continued Medical Work up  Expected Discharge Plan and Services Expected Discharge Plan: [place] In-house Referral: Clinical Social Work Discharge Planning Services: CM Consult Post Acute Care Choice: IP Rehab, [place] Living arrangements for the past 2 months: Apartment                                       Social Determinants of Health (SDOH) Interventions    Readmission Risk Interventions No flowsheet data found.

## 2019-12-09 NOTE — Progress Notes (Signed)
Patient ID: Diamond Rice, female   DOB: May 08, 1971, 49 y.o.   MRN: [id number]  PROGRESS NOTE    Diamond Rice  [id number] DOB: 01-03-1971 DOA: 12/06/2019 PCP: [name], MD   Brief Narrative:  49 year old female with history of hemorrhagic stroke in 10/07/2018 with residual left sided weakness, COPD, hypertension, hyperlipidemia, diabetes mellitus type 2, OSA on CPAP, anxiety, depression, substance abuse (marijuana, crack cocaine) presented with slurred speech.  Neurology was consulted: TPA was not given because of history of hemorrhagic stroke history.  CT of the head was negative for acute finding.  MRI of the brain showed acute small lacunar infarct in the left coronary radiata with no associated hemorrhage or mass-effect.  MRA head was negative for LVO.  Admitted for stroke work-up.  Assessment & Plan:   Acute ischemic stroke Dysphagia: Most likely neurogenic -Patient was seen by neurology and NIHSS 5.  TPA not given her LKW and hemorrhagic stroke history.  -Head CT negative for acute finding.  MRI brain showing a small acute lacunar infarct in the left corona radiata tracking into the posterior left lentiform.  No associated hemorrhage or mass-effect.  MRA head negative for LVO -Allow for permissive hypertension up to 220/120 -Continue aspirin and Plavix for 3 weeks as per neurology recommendations.  Continue statin. -2D echo showed EF of 65 to 70% with grade 1 diastolic dysfunction.  Carotid duplex pending.  Diet as per SLP recommendations.  PT recommends CIR placement.  CIR consulted. -LDL 95.  Hemoglobin 6.7 -Fall precautions.  Continue frequent neurochecks.  Hypertension -Blood pressure still on the higher side.  Allow for permissive hypertension.  Clonidine, hydralazine and lisinopril have been resumed by neurology on 12/08/2019.  Mild hypokalemia -Improved.  AKI -Prior creatinine between 1-1.3.  Presented with creatinine of 1.6.  Creatinine 1.36 today.   ACE  inhibitor resumed by neurology on 12/08/2019.  Monitor  COPD -Currently stable.  No signs of exacerbation.  Use DuoNeb as needed  OSA -Continue CPAP at night  Diabetes mellitus type 2 -Follow hemoglobin A1c.  Continue CBGs with SSI along with Levemir  History of polysubstance abuse -Urine drug screen is positive for cocaine and tetrahydrocannabinol.  Social worker consulted.  Counseled about cessation.  Patient still using drugs at least once a week as per the patient.  DVT prophylaxis: Lovenox Code Status: Full Family Communication: Spoke to patient at bedside Disposition Plan: Probable CIR once bed is available.  Currently medically stable for discharge to CIR  Consultants: Neurology  Procedures: None  Antimicrobials: None   Subjective: Patient seen and examined at bedside.  Patient is a very poor historian.  Speech is slightly better than yesterday but still does not provide good history.  Complains of intermittent nausea and some vomiting this morning.  No overnight fever, worsening shortness of breath reported.   Objective: Vitals:   12/09/19 0340 12/09/19 0745 12/09/19 0836 12/09/19 1134  BP: (!) 139/98 (!) 161/110  (!) 176/93  Pulse: 67 78 86 67  Resp: 18 (!) 22  18  Temp: 98 F (36.7 C)  97.7 F (36.5 C) 98.5 F (36.9 C)  TempSrc: Oral  Oral Oral  SpO2: 100%  99% 100%  Weight:      Height:        Intake/Output Summary (Last 24 hours) at 12/09/2019 1151 Last data filed at 12/09/2019 0500 Gross per 24 hour  Intake 320 ml  Output 600 ml  Net -280 ml   Filed Weights   12/07/19  1844  Weight: 82 kg    Examination:  General exam: No acute distress.  Looks older than stated age and deconditioned poor historian.  Speech is slightly better than yesterday but still does not participate in conversation much. Respiratory system: Bilateral decreased breath sounds at bases with no wheezing.  Some crackles  cardiovascular system: Rate controlled, S1-S2  heard Gastrointestinal system: Abdomen is obese, nondistended, soft and nontender.  Bowel sounds heard Extremities: Trace lower extremity edema.  No cyanosis   Data Reviewed: I have personally reviewed following labs and imaging studies  CBC: Recent Labs  Lab 12/06/19 1726 12/06/19 1728 12/06/19 1802 12/08/19 0342 12/09/19 0332  WBC  --  5.3  --  6.1 7.0  NEUTROABS  --  2.6  --  2.4 3.1  HGB 12.9 11.8* 12.6 10.9* 11.7*  HCT 38.0 37.7 37.0 33.9* 37.4  MCV  --  74.5*  --  73.5* 73.0*  PLT  --  273  --  238 [id number]   Basic Metabolic Panel: Recent Labs  Lab 12/06/19 1726 12/06/19 1726 12/06/19 1728 12/06/19 1802 12/06/19 2308 12/07/19 0411 12/08/19 0342 12/09/19 0332  NA 141   < > 140 143  --  144 142 141  K 3.1*   < > 3.2* 3.3*  --  3.5 3.2* 3.8  CL 106  --  107  --   --  111 107 110  CO2  --   --  21*  --   --  22 23 21*  GLUCOSE 178*  --  180*  --   --  104* 88 100*  BUN 17  --  17  --   --  15 14 12   CREATININE 1.50*  --  1.62*  --   --  1.31* 1.44* 1.36*  CALCIUM  --   --  8.8*  --   --  8.7* 8.9 9.0  MG  --   --   --   --  2.1  --  2.0 2.1   < > = values in this interval not displayed.   GFR: Estimated Creatinine Clearance: 49.6 mL/min (A) (by C-G formula based on SCr of 1.36 mg/dL (H)). Liver Function Tests: Recent Labs  Lab 12/06/19 1728 12/08/19 0342  AST 17 12*  ALT 8 8  ALKPHOS 49 51  BILITOT 0.4 1.0  PROT 7.0 6.6  ALBUMIN 3.0* 3.1*   No results for input(s): LIPASE, AMYLASE in the last 168 hours. No results for input(s): AMMONIA in the last 168 hours. Coagulation Profile: Recent Labs  Lab 12/06/19 1728  INR 1.0   Cardiac Enzymes: No results for input(s): CKTOTAL, CKMB, CKMBINDEX, TROPONINI in the last 168 hours. BNP (last 3 results) No results for input(s): PROBNP in the last 8760 hours. HbA1C: Recent Labs    12/06/19 2308 12/07/19 0410  HGBA1C 6.6* 6.7*   CBG: Recent Labs  Lab 12/08/19 1138 12/08/19 1610 12/08/19 2117  12/09/19 0604 12/09/19 1123  GLUCAP 109* 99 113* 103* 83   Lipid Profile: Recent Labs    12/07/19 0411  CHOL 158  HDL 45  LDLCALC 95  TRIG 89  CHOLHDL 3.5   Thyroid Function Tests: No results for input(s): TSH, T4TOTAL, FREET4, T3FREE, THYROIDAB in the last 72 hours. Anemia Panel: No results for input(s): VITAMINB12, FOLATE, FERRITIN, TIBC, IRON, RETICCTPCT in the last 72 hours. Sepsis Labs: No results for input(s): PROCALCITON, LATICACIDVEN in the last 168 hours.  Recent Results (from the past 240 hour(s))  SARS CORONAVIRUS  2 (TAT 6-24 HRS) Nasopharyngeal Nasopharyngeal Swab     Status: None   Collection Time: 12/06/19  7:15 PM   Specimen: Nasopharyngeal Swab  Result Value Ref Range Status   SARS Coronavirus 2 NEGATIVE NEGATIVE Final    Comment: (NOTE) SARS-CoV-2 target nucleic acids are NOT DETECTED. The SARS-CoV-2 RNA is generally detectable in upper and lower respiratory specimens during the acute phase of infection. Negative results do not preclude SARS-CoV-2 infection, do not rule out co-infections with other pathogens, and should not be used as the sole basis for treatment or other patient management decisions. Negative results must be combined with clinical observations, patient history, and epidemiological information. The expected result is Negative. Fact Sheet for Patients: SugarRoll.be Fact Sheet for Healthcare Providers: https://www.woods-mathews.com/ This test is not yet approved or cleared by the [place] FDA and  has been authorized for detection and/or diagnosis of SARS-CoV-2 by FDA under an Emergency Use Authorization (EUA). This EUA will remain  in effect (meaning this test can be used) for the duration of the COVID-19 declaration under Section 56 4(b)(1) of the Act, 21 U.S.C. section 360bbb-3(b)(1), unless the authorization is terminated or revoked sooner. Performed at [facility], [place]  [address] [place], [place] [address]          Radiology Studies: DG Swallowing Func-Speech Pathology  Result Date: 12/08/2019 Objective Swallowing Evaluation: Type of Study: MBS-Modified Barium Swallow Study  Patient Details Name: Diamond Rice MRN: [id number] Date of Birth: 1970/12/08 Today's Date: 12/08/2019 Time: SLP Start Time (ACUTE ONLY): 0805 -SLP Stop Time (ACUTE ONLY): 0826 SLP Time Calculation (min) (ACUTE ONLY): 21 min Past Medical History: Past Medical History: Diagnosis Date  Anxiety   Chronic lower back pain 04/17/2012  "just got over some; catched when I walked"  COPD (chronic obstructive pulmonary disease) ([place])   Critical lower limb ischemia 05/27/2018  Depression   Headache(784.0) 04/17/2012  "~ qod; lately waking up in am w/one"  High cholesterol   Hypertension   Migraines 04/17/2012  Obesity   Sleep apnea   CPAP  Stroke ([place])   Type II diabetes mellitus ([place]) 08/28/2002 Past Surgical History: Past Surgical History: Procedure Laterality Date  ABDOMINAL AORTOGRAM W/LOWER EXTREMITY Left 05/27/2018  Procedure: ABDOMINAL AORTOGRAM W/LOWER EXTREMITY Runoff and Possible Intervention;  Surgeon: Waynetta Sandy, MD;  Location: [facility];  Service: Cardiovascular;  Laterality: Left;  APPLICATION OF WOUND VAC Left 0000000  Procedure: APPLICATION OF WOUND VAC;  Surgeon: Waynetta Sandy, MD;  Location: [facility];  Service: Vascular;  Laterality: Left;  BIOPSY  04/04/2018  Procedure: BIOPSY;  Surgeon: [name], MD;  Location: [facility] ENDOSCOPY;  Service: Endoscopy;;  CARDIAC CATHETERIZATION  ~ 2011  COLONOSCOPY N/A 04/04/2018  Procedure: COLONOSCOPY;  Surgeon: [name], MD;  Location: [facility] ENDOSCOPY;  Service: Endoscopy;  Laterality: N/A;  LOWER EXTREMITY ANGIOGRAM Left 05/27/2018  PERIPHERAL VASCULAR INTERVENTION Left 05/27/2018  Procedure: PERIPHERAL VASCULAR INTERVENTION;  Surgeon: Waynetta Sandy, MD;  Location: [facility];  Service:  Cardiovascular;  Laterality: Left;  POLYPECTOMY  04/04/2018  Procedure: POLYPECTOMY;  Surgeon: [name], MD;  Location: [facility] ENDOSCOPY;  Service: Endoscopy;;  TRANSMETATARSAL AMPUTATION Left 05/28/2018  Procedure: AMPUTATION TOES THREE, FOUR AND FIVE on left foot;  Surgeon: Waynetta Sandy, MD;  Location: [facility];  Service: Vascular;  Laterality: Left;  WOUND DEBRIDEMENT Left 06/06/2018  Procedure: DEBRIDEMENT WOUND LEFT FOOT;  Surgeon: Waynetta Sandy, MD;  Location: [place];  Service: Vascular;  Laterality: Left;  HPI: 49 year old female with history of hemorrhagic stroke in 10/07/2018 with residual left sided weakness, COPD, hypertension, hyperlipidemia, diabetes mellitus type 2, OSA on CPAP, anxiety, depression, substance abuse (marijuana, crack cocaine) presented with slurred speech. MRI revealed acute small lacunar infarct in left corona radiata. BSE 1/21020 rec'd Dys 3, thin. MBS 09/18/18 oropharyngeal swallow WFL. Espohageal retention mid esophagus and w/u rec'd  Subjective: alert, cooperative Assessment / Plan / Recommendation CHL IP CLINICAL IMPRESSIONS 12/08/2019 Clinical Impression  Patient presents with mild oropharyngeal dysphagia. Oral phase is remarkable for decreased bolus cohesion, premature spillage to the vallecula and pyriform sinuses and lingual residue. When trialing a regular solid, the pt stated she was going to swallow the bolus before attempting to masticate, requiring Min cues to continue to fully masticate prior to swallowing. Pharyngeal phase is remarkable for delayed swallow initiation at the pyriform sinuses and reduced laryngeal closure.  Patient's vallecular space was also noted to be small, making it difficult for the bolus to collect there prior to the swallow. Liquids would either spill over into the larynx or to the pyriform sinsues. The small vallecular space and reduced laryngeal closure ultimately resulted in penetration/aspiration. Aspiration (PAS 7 or 8)  occurred during the swallow with thin liquids. As the pt was challenged by taking large consecutive sips, she aspirated, spit out what was remaining in her oral cavity and was noted with suspected nasopharyngeal regurgitation (nasal secretions and what appeared to be some barium expelled from her nasal cavity). The fluoro was not on during suspected nasopharyngeal regurgitation, therefore cannot confirm. A chin tuck was trialed to attempt to increase the space in the vallecula, however, this was unsuccessful. Recommend Dys 2 diet, nectar thick liquids, meds crushed in puree, with full supervision. Ensure the pt is taking small, single sips at a time and is following general aspiration precautions.  SLP Visit Diagnosis Dysphagia, oropharyngeal phase (R13.12) Attention and concentration deficit following -- Frontal lobe and executive function deficit following -- Impact on safety and function Moderate aspiration risk   CHL IP TREATMENT RECOMMENDATION 12/08/2019 Treatment Recommendations Therapy as outlined in treatment plan below   Prognosis 12/08/2019 Prognosis for Safe Diet Advancement Good Barriers to Reach Goals -- Barriers/Prognosis Comment -- CHL IP DIET RECOMMENDATION 12/08/2019 SLP Diet Recommendations Dysphagia 2 (Fine chop) solids;Nectar thick liquid Liquid Administration via Cup;Straw Medication Administration Crushed with puree Compensations Minimize environmental distractions;Slow rate;Small sips/bites Postural Changes Remain semi-upright after after feeds/meals (Comment);Seated upright at 90 degrees   CHL IP OTHER RECOMMENDATIONS 12/08/2019 Recommended Consults -- Oral Care Recommendations Oral care BID Other Recommendations Prohibited food (jello, ice cream, thin soups);Order thickener from pharmacy;Remove water pitcher   CHL IP FOLLOW UP RECOMMENDATIONS 12/08/2019 Follow up Recommendations Skilled Nursing facility   [facility] IP FREQUENCY AND DURATION 12/08/2019 Speech Therapy Frequency (ACUTE ONLY) min 2x/week  Treatment Duration 2 weeks      CHL IP ORAL PHASE 12/08/2019 Oral Phase Impaired Oral - Pudding Teaspoon -- Oral - Pudding Cup -- Oral - Honey Teaspoon -- Oral - Honey Cup -- Oral - Nectar Teaspoon -- Oral - Nectar Cup Lingual/palatal residue Oral - Nectar Straw Lingual/palatal residue Oral - Thin Teaspoon -- Oral - Thin Cup Decreased bolus cohesion;Premature spillage Oral - Thin Straw Decreased bolus cohesion;Premature spillage Oral - Puree WFL Oral - Mech Soft -- Oral - Regular Delayed oral transit Oral - Multi-Consistency -- Oral - Pill -- Oral Phase - Comment --  CHL IP PHARYNGEAL PHASE 12/08/2019 Pharyngeal Phase Impaired Pharyngeal- Pudding Teaspoon -- Pharyngeal --  Pharyngeal- Pudding Cup -- Pharyngeal -- Pharyngeal- Honey Teaspoon -- Pharyngeal -- Pharyngeal- Honey Cup -- Pharyngeal -- Pharyngeal- Nectar Teaspoon -- Pharyngeal -- Pharyngeal- Nectar Cup Penetration/Aspiration during swallow;Reduced airway/laryngeal closure Pharyngeal Material enters airway, remains ABOVE vocal cords then ejected out Pharyngeal- Nectar Straw Penetration/Aspiration during swallow;Reduced airway/laryngeal closure Pharyngeal Material enters airway, remains ABOVE vocal cords then ejected out Pharyngeal- Thin Teaspoon -- Pharyngeal -- Pharyngeal- Thin Cup Penetration/Aspiration during swallow;Trace aspiration;Delayed swallow initiation-pyriform sinuses;Compensatory strategies attempted (with notebox);Other (Comment);Reduced airway/laryngeal closure Pharyngeal Material enters airway, passes BELOW cords without attempt by patient to eject out (silent aspiration) Pharyngeal- Thin Straw Penetration/Aspiration during swallow;Trace aspiration;Delayed swallow initiation-pyriform sinuses;Compensatory strategies attempted (with notebox);Other (Comment);Reduced airway/laryngeal closure Pharyngeal Material enters airway, passes BELOW cords without attempt by patient to eject out (silent aspiration) Pharyngeal- Puree WFL Pharyngeal --  Pharyngeal- Mechanical Soft -- Pharyngeal -- Pharyngeal- Regular WFL Pharyngeal -- Pharyngeal- Multi-consistency -- Pharyngeal -- Pharyngeal- Pill -- Pharyngeal -- Pharyngeal Comment --  CHL IP CERVICAL ESOPHAGEAL PHASE 12/08/2019 Cervical Esophageal Phase WFL Pudding Teaspoon -- Pudding Cup -- Honey Teaspoon -- Honey Cup -- Nectar Teaspoon -- Nectar Cup -- Nectar Straw -- Thin Teaspoon -- Thin Cup -- Thin Straw -- Puree -- Mechanical Soft -- Regular -- Multi-consistency -- Pill -- Cervical Esophageal Comment -- Osie Bond., M.A. CCC-SLP Acute Rehabilitation Services Pager [phone] Office [phone] 12/08/2019, 9:45 AM              VAS US CAROTID  Result Date: 12/08/2019 Carotid Arterial Duplex Study Indications:       CVA and Speech disturbance. Risk Factors:      Hypertension, hyperlipidemia, Diabetes, prior CVA. Other Factors:     Substance abuse. Comparison Study:  No prior study on file Performing Technologist: Sharion Dove RVS  Examination Guidelines: A complete evaluation includes B-mode imaging, spectral Doppler, color Doppler, and power Doppler as needed of all accessible portions of each vessel. Bilateral testing is considered an integral part of a complete examination. Limited examinations for reoccurring indications may be performed as noted.  Right Carotid Findings: +----------+--------+--------+--------+------------------+------------------+             PSV cm/s EDV cm/s Stenosis Plaque Description Comments            +----------+--------+--------+--------+------------------+------------------+  CCA Prox   1        0                                    intimal thickening  +----------+--------+--------+--------+------------------+------------------+  CCA Distal 1        0                                    intimal thickening  +----------+--------+--------+--------+------------------+------------------+  ICA Prox   1        0                 heterogenous       tortuous             +----------+--------+--------+--------+------------------+------------------+  ICA Distal 1        0                                    tortuous            +----------+--------+--------+--------+------------------+------------------+  ECA        1        0                                                        +----------+--------+--------+--------+------------------+------------------+ +----------+--------+-------+--------+-------------------+             PSV cm/s EDV cms Describe Arm Pressure (mmHG)  +----------+--------+-------+--------+-------------------+  Subclavian 1                                              +----------+--------+-------+--------+-------------------+ +---------+--------+-+--------+-+  Vertebral PSV cm/s 0 EDV cm/s 0  +---------+--------+-+--------+-+  Left Carotid Findings: +----------+--------+--------+--------+------------------+------------------+             PSV cm/s EDV cm/s Stenosis Plaque Description Comments            +----------+--------+--------+--------+------------------+------------------+  CCA Prox   1        0                                    intimal thickening  +----------+--------+--------+--------+------------------+------------------+  CCA Distal 1        0                                    intimal thickening  +----------+--------+--------+--------+------------------+------------------+  ICA Prox   1        0                                                        +----------+--------+--------+--------+------------------+------------------+  ICA Distal 1        0                                    tortuous            +----------+--------+--------+--------+------------------+------------------+  ECA        1        0                                                        +----------+--------+--------+--------+------------------+------------------+ +----------+--------+--------+--------+-------------------+             PSV cm/s EDV cm/s Describe Arm Pressure (mmHG)   +----------+--------+--------+--------+-------------------+  Subclavian 2                                               +----------+--------+--------+--------+-------------------+ +---------+--------+-+--------+-+  Vertebral PSV cm/s 0 EDV cm/s 0  +---------+--------+-+--------+-+   Summary: Right Carotid: The extracranial vessels were near-normal with only minimal wall  thickening or plaque. Left Carotid: The extracranial vessels were near-normal with only minimal wall               thickening or plaque. Vertebrals:  Bilateral vertebral arteries demonstrate antegrade flow. Subclavians: Normal flow hemodynamics were seen in bilateral subclavian              arteries. *See table(s) above for measurements and observations.  Electronically signed by Antony Contras MD on 12/08/2019 at 1:55:53 PM.    Final         Scheduled Meds:   stroke: mapping our early stages of recovery book   Does not apply Once   ARIPiprazole  15 mg Oral QHS   aspirin EC  81 mg Oral Daily   atorvastatin  40 mg Oral q1800   chlorhexidine  15 mL Mouth Rinse BID   cloNIDine  0.3 mg Oral BID   clopidogrel  75 mg Oral Daily   enoxaparin (LOVENOX) injection  40 mg Subcutaneous Daily   ferrous sulfate  325 mg Oral Q breakfast   hydrALAZINE  100 mg Oral TID   insulin aspart  0-15 Units Subcutaneous TID WC   insulin detemir  15 Units Subcutaneous BID   lisinopril  40 mg Oral Daily   mouth rinse  15 mL Mouth Rinse q12n4p   pentoxifylline  400 mg Oral TID WC   Continuous Infusions:         Aline August, MD [facility] 12/09/2019, 11:51 AM

## 2019-12-09 NOTE — Progress Notes (Signed)
Physical Therapy Treatment Patient Details Name: Diamond Rice MRN: [id number] DOB: [birth year] Today's Date: 12/09/2019    History of Present Illness Diamond Rice is an 49 y.o. female with PMH hemorrhagic stroke in January 2020 ( residual left side weakness), COPD ( not on baseline oxygen, HTN, HLD, and DM2, who presented to [facility] ED as a code stroke for slurred speech. Small acute lacunar infarct in the left corona radiata tracking into the posterior left lentiform, and Underlying very severe chronic small vessel disease. 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     PT Comments    Patient seen for mobility progression. Pt tolerated short distance gait in room and limited by c/o dizziness and nausea. Pt reports having vomited earlier today after receiving medication. RN notified of BP and that pt is nauseated again. PT will continue to follow acutely and progress as tolerated with anticipation of d/c to post acute rehab.   BP in supine 188/151 BP in sitting 176/93 (118) BP in standing 171/112 (120) BP after mobility 192/105 (132)   Follow Up Recommendations  CIR     Equipment Recommendations  None recommended by PT    Recommendations for Other Services       Precautions / Restrictions Precautions Precautions: Fall Precaution Comments: BP <220/120 Restrictions Weight Bearing Restrictions: No    Mobility  Bed Mobility Overal bed mobility: Needs Assistance Bed Mobility: Supine to Sit     Supine to sit: Supervision     General bed mobility comments: use of rail and HOB elevated; increased time and effort; supervision for safety  Transfers Overall transfer level: Needs assistance Equipment used: 1 person hand held assist Transfers: Sit to/from Stand Sit to Stand: Min assist         General transfer comment: assist to steady  Ambulation/Gait Ambulation/Gait assistance: Min assist;+2 safety/equipment Gait Distance (Feet): 10 Feet Assistive  device: 2 person hand held assist Gait Pattern/deviations: Step-through pattern;Decreased step length - left;Decreased step length - right;Leaning posteriorly;Drifts right/left;Narrow base of support Gait velocity: decreased   General Gait Details: assistance required for balance and safety; distance limited by c/o dizziness and nausea    Stairs             Wheelchair Mobility    Modified Rankin (Stroke Patients Only) Modified Rankin (Stroke Patients Only) Pre-Morbid Rankin Score: Slight disability Modified Rankin: Moderately severe disability     Balance Overall balance assessment: Needs assistance Sitting-balance support: Feet supported;No upper extremity supported Sitting balance-Leahy Scale: Fair     Standing balance support: Single extremity supported Standing balance-Leahy Scale: Poor                              Cognition Arousal/Alertness: Awake/alert Behavior During Therapy: WFL for tasks assessed/performed Overall Cognitive Status: History of cognitive impairments - at baseline                                 General Comments: follows single step cues with increased time      Exercises      General Comments        Pertinent Vitals/Pain Pain Assessment: No/denies pain    Home Living                      Prior Function            PT  Goals (current goals can now be found in the care plan section) Progress towards PT goals: Progressing toward goals    Frequency    Min 4X/week      PT Plan Current plan remains appropriate    Co-evaluation PT/OT/SLP Co-Evaluation/Treatment: Yes Reason for Co-Treatment: For patient/therapist safety;To address functional/ADL transfers PT goals addressed during session: Mobility/safety with mobility        AM-PAC PT "6 Clicks" Mobility   Outcome Measure  Help needed turning from your back to your side while in a flat bed without using bedrails?: None Help needed  moving from lying on your back to sitting on the side of a flat bed without using bedrails?: None Help needed moving to and from a bed to a chair (including a wheelchair)?: A Little Help needed standing up from a chair using your arms (e.g., wheelchair or bedside chair)?: A Little Help needed to walk in hospital room?: A Lot Help needed climbing 3-5 steps with a railing? : A Lot 6 Click Score: 18    End of Session Equipment Utilized During Treatment: Gait belt Activity Tolerance: Treatment limited secondary to medical complications (Comment)(limited by dizziness/nausea) Patient left: with call bell/phone within reach;in chair;with chair alarm set Nurse Communication: Mobility status;Other (comment)(BP, nausea) PT Visit Diagnosis: Unsteadiness on feet (R26.81);Other abnormalities of gait and mobility (R26.89);Hemiplegia and hemiparesis Hemiplegia - Right/Left: Left Hemiplegia - dominant/non-dominant: Non-dominant Hemiplegia - caused by: Cerebral infarction     Time: 1126-1150 PT Time Calculation (min) (ACUTE ONLY): 24 min  Charges:  $Gait Training: 8-22 mins                     Diamond Rice, PTA Acute Rehabilitation Services Pager: [phone] Office: [phone]     Diamond Rice 12/09/2019, 1:18 PM

## 2019-12-09 NOTE — Evaluation (Signed)
Occupational Therapy Evaluation Patient Details Name: Diamond Rice MRN: [id number] DOB: 06-01-1971 Today's Date: 12/09/2019    History of Present Illness Diamond Rice is an 49 y.o. female with PMH hemorrhagic stroke in January 2020 ( residual left side weakness), COPD ( not on baseline oxygen, HTN, HLD, and DM2, who presented to [facility] ED as a code stroke for slurred speech. Small acute lacunar infarct in the left corona radiata tracking into the posterior left lentiform, and Underlying very severe chronic small vessel disease. 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    Clinical Impression   PT admitted with CVA. Pt currently with functional limitiations due to the deficits listed below (see OT problem list). Pt noted to have expressive language deficits and impaired ROM L UE this session. Pt with BP that was very elevated throughout the session with decrease with position changes increased risk for falls. Pt with symptoms with changes mainly nausea. Pt reports x1 vomiting event today.  Pt will benefit from skilled OT to increase their independence and safety with adls and balance to allow discharge CIR. .     Follow Up Recommendations  CIR    Equipment Recommendations  None recommended by OT    Recommendations for Other Services Rehab consult     Precautions / Restrictions Precautions Precautions: Fall Precaution Comments: BP <220/120 Restrictions Weight Bearing Restrictions: No      Mobility Bed Mobility Overal bed mobility: Needs Assistance Bed Mobility: Supine to Sit     Supine to sit: Supervision     General bed mobility comments: use of rail and HOB elevated; increased time and effort; supervision for safety  Transfers Overall transfer level: Needs assistance Equipment used: 1 person hand held assist Transfers: Sit to/from Stand Sit to Stand: Min assist         General transfer comment: assist to steady    Balance Overall balance  assessment: Needs assistance Sitting-balance support: Feet supported;No upper extremity supported Sitting balance-Leahy Scale: Fair     Standing balance support: Single extremity supported Standing balance-Leahy Scale: Poor                             ADL either performed or assessed with clinical judgement   ADL Overall ADL's : Needs assistance/impaired Eating/Feeding: Sitting;Set up Eating/Feeding Details (indicate cue type and reason): full supervision Grooming: Wash/dry face;Min guard;Sitting       Lower Body Bathing: Maximal assistance           Toilet Transfer: Minimal assistance Toilet Transfer Details (indicate cue type and reason): simulated OOB to chair           General ADL Comments: pt with BP limiting session and symptoms of nausea.      Vision         Perception     Praxis      Pertinent Vitals/Pain Pain Assessment: Faces Faces Pain Scale: No hurt     Hand Dominance Right   Extremity/Trunk Assessment Upper Extremity Assessment Upper Extremity Assessment: LUE deficits/detail LUE Deficits / Details: decrease coordination, shoulder elevation to 80 degrees with assessory muscle recruitment. pt unable to sustain shoulder flexion with correct alignment. pt able to follow 2 step command with hand. pt requires increased time and effort to clap hands LUE Sensation: decreased light touch LUE Coordination: decreased fine motor;decreased gross motor   Lower Extremity Assessment Lower Extremity Assessment: Defer to PT evaluation   Cervical / Trunk Assessment  Cervical / Trunk Assessment: Other exceptions Cervical / Trunk Exceptions: rib flared on left side   Communication Communication Communication: Expressive difficulties   Cognition Arousal/Alertness: Awake/alert Behavior During Therapy: WFL for tasks assessed/performed Overall Cognitive Status: No family/caregiver present to determine baseline cognitive functioning                                  General Comments: follows single step cues with increased time/ pointing to items and gestures instead of words this session   General Comments  on arrival supine 188/151  sitting 176/93 (118) standing 171/112 and movement sitting 192/105 (132) RN notified of sympomts    Exercises     Shoulder Instructions      Home Living Family/patient expects to be discharged to:: Skilled nursing facility                                        Prior Functioning/Environment Level of Independence: Needs assistance  Gait / Transfers Assistance Needed: with quad cane     Comments: minimal verbalizations hard to fully assess        OT Problem List: Decreased strength;Decreased activity tolerance;Impaired balance (sitting and/or standing);Decreased safety awareness;Decreased cognition;Decreased coordination;Decreased range of motion;Impaired UE functional use;Cardiopulmonary status limiting activity      OT Treatment/Interventions: Self-care/ADL training;Therapeutic exercise;Neuromuscular education;DME and/or AE instruction;Manual therapy;Energy conservation;Therapeutic activities;Cognitive remediation/compensation;Patient/family education;Balance training    OT Goals(Current goals can be found in the care plan section) Acute Rehab OT Goals Patient Stated Goal: Did not state reports nausea today OT Goal Formulation: Patient unable to participate in goal setting Time For Goal Achievement: 12/23/19 Potential to Achieve Goals: Good  OT Frequency: Min 2X/week   Barriers to D/C:            Co-evaluation   Reason for Co-Treatment: For patient/therapist safety;To address functional/ADL transfers PT goals addressed during session: Mobility/safety with mobility        AM-PAC OT "6 Clicks" Daily Activity     Outcome Measure Help from another person eating meals?: A Little Help from another person taking care of personal grooming?: A Little Help  from another person toileting, which includes using toliet, bedpan, or urinal?: A Lot Help from another person bathing (including washing, rinsing, drying)?: A Lot Help from another person to put on and taking off regular upper body clothing?: A Lot Help from another person to put on and taking off regular lower body clothing?: A Lot 6 Click Score: 14   End of Session Nurse Communication: Mobility status;Precautions  Activity Tolerance: Patient tolerated treatment well Patient left: in chair;with call bell/phone within reach;with chair alarm set  OT Visit Diagnosis: Unsteadiness on feet (R26.81);Muscle weakness (generalized) (M62.81)                Time: [id number] OT Time Calculation (min): 20 min Charges:  OT General Charges $OT Visit: 1 Visit OT Evaluation $OT Eval Moderate Complexity: 1 Mod   Brynn, OTR/L  Acute Rehabilitation Services Pager: [phone] Office: [phone] .   Jeri Modena 12/09/2019, 3:21 PM

## 2019-12-09 NOTE — Evaluation (Signed)
Speech Language Pathology Evaluation Patient Details Name: Diamond Rice MRN: [id number] DOB: 1971-01-01 Today's Date: 12/09/2019 Time: [id number] SLP Time Calculation (min) (ACUTE ONLY): 12 min  Problem List:  Patient Active Problem List   Diagnosis Date Noted  . Acute CVA (cerebrovascular accident) ([employer]) 12/06/2019  . AKI (acute kidney injury) ([place]) 12/06/2019  . LVH (left ventricular hypertrophy) 08/10/2019  . Snoring 02/19/2019  . Hemorrhagic stroke ([place])   . Diastolic dysfunction   . Type 2 diabetes mellitus ([place])   . Anemia of chronic disease   . Chronic obstructive pulmonary disease ([place])   . ICH (intracerebral hemorrhage) ([place]) 08/30/2018  . Hypertensive heart disease without heart failure 07/04/2018  . Nonrheumatic aortic valve insufficiency 07/04/2018  . Esophageal abnormality   . Class 2 severe obesity due to excess calories with serious comorbidity and body mass index (BMI) of 35.0 to 35.9 in adult [facility])   . Peripheral vascular disease ([place])   . History of osteomyelitis   . History of amputation of lesser toe ([place])   . Pulmonary embolism ([place]) 06/22/2018  . Microcytic anemia 06/17/2018  . At risk for adverse drug reaction 06/11/2018  . Critical lower limb ischemia 05/27/2018  . GI bleeding 04/02/2018  . Acute renal failure (ARF) ([place]) 04/02/2018  . Hypokalemia 04/02/2018  . Polysubstance abuse ([place]) 04/02/2018  . Diabetic foot ulcer ([place]) 04/02/2018  . Cannabis use disorder, moderate, dependence ([place]) 05/19/2017  . Cocaine use disorder, moderate, dependence ([place]) 05/19/2017  . Schizoaffective disorder, bipolar type ([place]) 10/07/2015  . OSA (obstructive sleep apnea) 06/09/2015  . Vitamin D deficiency 01/06/2015  . Bilateral knee pain 01/05/2015  . Numbness and tingling in right hand 01/05/2015  . Insomnia 12/07/2014  . Right-sided lacunar infarction ([place]) 09/15/2014  . Left hemiparesis ([place]) 09/15/2014  . Dyspnea   . Back pain 09/11/2013  . Psychoactive  substance-induced organic mood disorder ([place]) 05/28/2013  . Cocaine abuse with cocaine-induced mood disorder ([place]) 05/28/2013  . Cannabis dependence with cannabis-induced anxiety disorder ([place]) 05/28/2013  . Morbid obesity with BMI of 45.0-49.9, adult ([place]) 04/25/2012  . Chest pain 04/17/2012  . Hypertension 04/17/2012  . DM (diabetes mellitus) with peripheral vascular complication ([place]) [id number]  . Hyperlipidemia 04/17/2012  . Tobacco use 04/17/2012   Past Medical History:  Past Medical History:  Diagnosis Date  . Anxiety   . Chronic lower back pain 04/17/2012   "just got over some; catched when I walked"  . COPD (chronic obstructive pulmonary disease) ([place])   . Critical lower limb ischemia 05/27/2018  . Depression   . Headache(784.0) 04/17/2012   "~ qod; lately waking up in am w/one"  . High cholesterol   . Hypertension   . Migraines 04/17/2012  . Obesity   . Sleep apnea    CPAP  . Stroke ([place])   . Type II diabetes mellitus ([place]) 08/28/2002   Past Surgical History:  Past Surgical History:  Procedure Laterality Date  . ABDOMINAL AORTOGRAM W/LOWER EXTREMITY Left 05/27/2018   Procedure: ABDOMINAL AORTOGRAM W/LOWER EXTREMITY [place] and Possible Intervention;  Surgeon: Waynetta Sandy, MD;  Location: [facility] CV LAB;  Service: Cardiovascular;  Laterality: Left;  . APPLICATION OF WOUND VAC Left 06/06/2018   Procedure: APPLICATION OF WOUND VAC;  Surgeon: Waynetta Sandy, MD;  Location: [place];  Service: Vascular;  Laterality: Left;  . BIOPSY  04/04/2018   Procedure: BIOPSY;  Surgeon: [name], MD;  Location: [facility] ENDOSCOPY;  Service: Endoscopy;;  . CARDIAC CATHETERIZATION  ~ 2011  .  COLONOSCOPY N/A 04/04/2018   Procedure: COLONOSCOPY;  Surgeon: [name], MD;  Location: [facility] ENDOSCOPY;  Service: Endoscopy;  Laterality: N/A;  . LOWER EXTREMITY ANGIOGRAM Left 05/27/2018  . PERIPHERAL VASCULAR INTERVENTION Left 05/27/2018   Procedure: PERIPHERAL VASCULAR  INTERVENTION;  Surgeon: Waynetta Sandy, MD;  Location: [place] CV LAB;  Service: Cardiovascular;  Laterality: Left;  . POLYPECTOMY  04/04/2018   Procedure: POLYPECTOMY;  Surgeon: [name], MD;  Location: [facility] ENDOSCOPY;  Service: Endoscopy;;  . TRANSMETATARSAL AMPUTATION Left 05/28/2018   Procedure: AMPUTATION TOES THREE, FOUR AND FIVE on left foot;  Surgeon: Waynetta Sandy, MD;  Location: [place];  Service: Vascular;  Laterality: Left;  . WOUND DEBRIDEMENT Left 06/06/2018   Procedure: DEBRIDEMENT WOUND LEFT FOOT;  Surgeon: Waynetta Sandy, MD;  Location: [facility] OR;  Service: Vascular;  Laterality: Left;   HPI:  49 year old female with history of hemorrhagic stroke in 10/07/2018 with residual left sided weakness, COPD, hypertension, hyperlipidemia, diabetes mellitus type 2, OSA on CPAP, anxiety, depression, substance abuse (marijuana, crack cocaine) presented with slurred speech. MRI revealed acute small lacunar infarct in left corona radiata. BSE 1/21020 rec'd Dys 3, thin. MBS 09/18/18 oropharyngeal swallow WFL. Espohageal retention mid esophagus and w/u rec'd   Assessment / Plan / Recommendation Clinical Impression   Pt's intelligibility of speech is impaired at the word to short phrase level, impacted by hyperresonance, impaired articulation, and rough/strained phonation. At times she seems to struggle to initiate phonation with groping behaviors noted. Her comprehension seems relatively intact and she follows commands well. She can also write fluently. White board was provided as well as a Therapist, nutritional. Pt returned demonstration of communication board with Mod I. SLP will continue to follow to maximize functional communication.    SLP Assessment  SLP Recommendation/Assessment: Patient needs continued Speech Lanaguage Pathology Services SLP Visit Diagnosis: Dysarthria and anarthria (R47.1)    Follow Up Recommendations  Inpatient Rehab    Frequency  and Duration min 2x/week  2 weeks      SLP Evaluation Cognition  Overall Cognitive Status: No family/caregiver present to determine baseline cognitive functioning Arousal/Alertness: Awake/alert Attention: Sustained Sustained Attention: Appears intact(during simple, functional tasks) Awareness: Appears intact(in simple, verbal/functional tasks) Problem Solving: Impaired Problem Solving Impairment: Verbal basic       Comprehension  Auditory Comprehension Overall Auditory Comprehension: Appears within functional limits for tasks assessed Visual Recognition/Discrimination Discrimination: Within Function Limits Reading Comprehension Reading Status: Within funtional limits    Expression Expression Primary Mode of Expression: Verbal Verbal Expression Overall Verbal Expression: (needs more assessment as dysarthria improves) Written Expression Dominant Hand: Right Written Expression: Within Functional Limits   Oral / Motor  Motor Speech Overall Motor Speech: Impaired Respiration: Within functional limits Phonation: Breathy Resonance: Hypernasality Articulation: Impaired Level of Impairment: Word Intelligibility: Intelligibility reduced Word: 25-49% accurate Phrase: 25-49% accurate Motor Planning: Impaired Level of Impairment: Word Motor Speech Errors: Aware;Groping for words;Inconsistent   GO                     Osie Bond., M.A. [place] Acute Rehabilitation Services Pager [phone] Office [phone]  12/09/2019, 3:08 PM

## 2019-12-09 NOTE — Progress Notes (Addendum)
  Speech Language Pathology Treatment: Dysphagia  Patient Details Name: Diamond Rice MRN: [id number] DOB: [birth year] Today's Date: 12/09/2019 Time: [id number] SLP Time Calculation (min) (ACUTE ONLY): 10 min  Assessment / Plan / Recommendation Clinical Impression  Dysphagia tx focusing on f/u s/p MBS on previous date was limited by pt nausea. SLP provided options for current diet textures but pt declined all POs offered. She did however start drinking the thin water that was left in a cup on her bedside table. No overt s/s of aspiration were noted, although on MBS aspiration was sometimes silent. SLP provided education about MBS results and recommendations. Pt acknowledged information but still did not want to try other trials of appropriate consistency. SLP did also adjust diet to reflect recommendation for chopped foods. Will continue to follow.   HPI HPI: 49 year old female with history of hemorrhagic stroke in 10/07/2018 with residual left sided weakness, COPD, hypertension, hyperlipidemia, diabetes mellitus type 2, OSA on CPAP, anxiety, depression, substance abuse (marijuana, crack cocaine) presented with slurred speech. MRI revealed acute small lacunar infarct in left corona radiata. BSE 1/21020 rec'd Dys 3, thin. MBS 09/18/18 oropharyngeal swallow WFL. Espohageal retention mid esophagus and w/u rec'd      SLP Plan  Continue with current plan of care       Recommendations  Diet recommendations: Dysphagia 2 (fine chop);Nectar-thick liquid Liquids provided via: Cup;Straw Medication Administration: Crushed with puree Supervision: Patient able to self feed;Full supervision/cueing for compensatory strategies Compensations: Minimize environmental distractions;Slow rate;Small sips/bites Postural Changes and/or Swallow Maneuvers: Seated upright 90 degrees                Oral Care Recommendations: Oral care BID Follow up Recommendations: CIR SLP Visit Diagnosis: Dysphagia,  oropharyngeal phase (R13.12) Plan: Continue with current plan of care       GO                 Osie Bond., M.A. [place] Acute Rehabilitation Services Pager [phone] Office [phone]  12/09/2019, 3:02 PM

## 2019-12-10 ENCOUNTER — Encounter: Payer: Self-pay | Admitting: Internal Medicine

## 2019-12-10 ENCOUNTER — Non-Acute Institutional Stay (SKILLED_NURSING_FACILITY): Payer: Medicare Other | Admitting: Internal Medicine

## 2019-12-10 ENCOUNTER — Encounter (HOSPITAL_COMMUNITY): Payer: Self-pay | Admitting: Internal Medicine

## 2019-12-10 DIAGNOSIS — Z794 Long term (current) use of insulin: Secondary | ICD-10-CM

## 2019-12-10 DIAGNOSIS — N179 Acute kidney failure, unspecified: Secondary | ICD-10-CM | POA: Diagnosis not present

## 2019-12-10 DIAGNOSIS — E1151 Type 2 diabetes mellitus with diabetic peripheral angiopathy without gangrene: Secondary | ICD-10-CM

## 2019-12-10 DIAGNOSIS — E119 Type 2 diabetes mellitus without complications: Secondary | ICD-10-CM

## 2019-12-10 DIAGNOSIS — I10 Essential (primary) hypertension: Secondary | ICD-10-CM

## 2019-12-10 DIAGNOSIS — I639 Cerebral infarction, unspecified: Secondary | ICD-10-CM

## 2019-12-10 LAB — CBC WITH DIFFERENTIAL/PLATELET
Abs Immature Granulocytes: 0.02 10*3/uL (ref 0.00–0.07)
Basophils Absolute: 0.1 10*3/uL (ref 0.0–0.1)
Basophils Relative: 1 %
Eosinophils Absolute: 0.2 10*3/uL (ref 0.0–0.5)
Eosinophils Relative: 3 %
HCT: 37.6 % (ref 36.0–46.0)
Hemoglobin: 11.8 g/dL — ABNORMAL LOW (ref 12.0–15.0)
Immature Granulocytes: 0 %
Lymphocytes Relative: 50 %
Lymphs Abs: 3.7 10*3/uL (ref 0.7–4.0)
MCH: 23.3 pg — ABNORMAL LOW (ref 26.0–34.0)
MCHC: 31.4 g/dL (ref 30.0–36.0)
MCV: 74.3 fL — ABNORMAL LOW (ref 80.0–100.0)
Monocytes Absolute: 0.7 10*3/uL (ref 0.1–1.0)
Monocytes Relative: 10 %
Neutro Abs: 2.6 10*3/uL (ref 1.7–7.7)
Neutrophils Relative %: 36 %
Platelets: 251 10*3/uL (ref 150–400)
RBC: 5.06 MIL/uL (ref 3.87–5.11)
RDW: 16.3 % — ABNORMAL HIGH (ref 11.5–15.5)
WBC: 7.3 10*3/uL (ref 4.0–10.5)
nRBC: 0 % (ref 0.0–0.2)

## 2019-12-10 LAB — BASIC METABOLIC PANEL
Anion gap: 9 (ref 5–15)
BUN: 12 mg/dL (ref 6–20)
CO2: 23 mmol/L (ref 22–32)
Calcium: 9.3 mg/dL (ref 8.9–10.3)
Chloride: 110 mmol/L (ref 98–111)
Creatinine, Ser: 1.48 mg/dL — ABNORMAL HIGH (ref 0.44–1.00)
GFR calc Af Amer: 48 mL/min — ABNORMAL LOW (ref >60)
GFR calc non Af Amer: 42 mL/min — ABNORMAL LOW (ref >60)
Glucose, Bld: 85 mg/dL (ref 70–99)
Potassium: 3.7 mmol/L (ref 3.5–5.1)
Sodium: 142 mmol/L (ref 135–145)

## 2019-12-10 LAB — GLUCOSE, CAPILLARY
Glucose-Capillary: 98 mg/dL (ref 70–99)
Glucose-Capillary: 149 mg/dL — ABNORMAL HIGH (ref 70–99)

## 2019-12-10 LAB — MAGNESIUM: Magnesium: 2.2 mg/dL (ref 1.7–2.4)

## 2019-12-10 MED ORDER — ATORVASTATIN CALCIUM 40 MG PO TABS: 40.0000 mg | ORAL_TABLET | Freq: Every day | ORAL | Status: DC

## 2019-12-10 MED ORDER — CLOPIDOGREL BISULFATE 75 MG PO TABS: 75.0000 mg | ORAL_TABLET | Freq: Every day | ORAL | 0 refills | Status: DC

## 2019-12-10 MED ORDER — RESOURCE THICKENUP CLEAR PO POWD
ORAL | Status: DC | PRN
  Filled 2019-12-10: qty 125

## 2019-12-10 NOTE — Progress Notes (Signed)
Physical Therapy Treatment Patient Details Name: Diamond Rice MRN: [id number] DOB: [birth year] Today's Date: 12/10/2019    History of Present Illness Blakeney Suitt is an 49 y.o. female with PMH hemorrhagic stroke in January 2020 ( residual left side weakness), COPD ( not on baseline oxygen, HTN, HLD, and DM2, who presented to [facility] ED as a code stroke for slurred speech. Small acute lacunar infarct in the left corona radiata tracking into the posterior left lentiform, and Underlying very severe chronic small vessel disease. 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     PT Comments    Patient is making progress toward PT goals. No c/o dizziness or nausea this session and BP 144/82 (102). PT will continue to follow acutely and progress as tolerated with anticipated d/c to post acute rehab.    Follow Up Recommendations  CIR (noted pt going to SNF)     Equipment Recommendations  None recommended by PT    Recommendations for Other Services       Precautions / Restrictions Precautions Precautions: Fall Precaution Comments: BP <220/120 Restrictions Weight Bearing Restrictions: No    Mobility  Bed Mobility Overal bed mobility: Modified Independent Bed Mobility: Supine to Sit           General bed mobility comments: use of rail and HOB elevated; increased time and effort  Transfers Overall transfer level: Needs assistance Equipment used: 1 person hand held assist Transfers: Sit to/from Stand Sit to Stand: Min assist         General transfer comment: assist to steady  Ambulation/Gait Ambulation/Gait assistance: Min assist;+2 safety/equipment(chair follow) Gait Distance (Feet): (12 ft total with seated break) Assistive device: Quad cane;1 person hand held assist Gait Pattern/deviations: Step-through pattern;Decreased step length - left;Decreased step length - right;Narrow base of support;Decreased dorsiflexion - left Gait velocity: decreased    General Gait Details: tendency for circumduction on L side during swing phase given difficulty with L knee flexion; quad cane with R UE and HHA on L; assistance required for balance; cues for upright posture, sequencing, and increased BOS    Stairs             Wheelchair Mobility    Modified Rankin (Stroke Patients Only) Modified Rankin (Stroke Patients Only) Pre-Morbid Rankin Score: Slight disability Modified Rankin: Moderately severe disability     Balance Overall balance assessment: Needs assistance Sitting-balance support: Feet supported;No upper extremity supported Sitting balance-Leahy Scale: Fair     Standing balance support: Single extremity supported;Bilateral upper extremity supported Standing balance-Leahy Scale: Poor                              Cognition Arousal/Alertness: Awake/alert Behavior During Therapy: WFL for tasks assessed/performed Overall Cognitive Status: No family/caregiver present to determine baseline cognitive functioning                                 General Comments: follows single step cues with increased time/ pointing to items and gestures instead of words this session      Exercises      General Comments General comments (skin integrity, edema, etc.): BP 144/82 (102); no c/o dizziness or nausea      Pertinent Vitals/Pain Pain Assessment: Faces Faces Pain Scale: Hurts little more Pain Location: R LE Pain Descriptors / Indicators: Grimacing;Guarding;Sore Pain Intervention(s): Limited activity within patient's tolerance;Monitored during session  Home Living                      Prior Function            PT Goals (current goals can now be found in the care plan section) Progress towards PT goals: Progressing toward goals    Frequency    Min 4X/week      PT Plan Current plan remains appropriate    Co-evaluation              AM-PAC PT "6 Clicks" Mobility   Outcome  Measure  Help needed turning from your back to your side while in a flat bed without using bedrails?: None Help needed moving from lying on your back to sitting on the side of a flat bed without using bedrails?: None Help needed moving to and from a bed to a chair (including a wheelchair)?: A Little Help needed standing up from a chair using your arms (e.g., wheelchair or bedside chair)?: A Little Help needed to walk in hospital room?: A Lot Help needed climbing 3-5 steps with a railing? : A Lot 6 Click Score: 18    End of Session Equipment Utilized During Treatment: Gait belt Activity Tolerance: Patient tolerated treatment well Patient left: with call bell/phone within reach;in chair;with chair alarm set Nurse Communication: Mobility status PT Visit Diagnosis: Unsteadiness on feet (R26.81);Other abnormalities of gait and mobility (R26.89);Hemiplegia and hemiparesis Hemiplegia - Right/Left: Left Hemiplegia - dominant/non-dominant: Non-dominant Hemiplegia - caused by: Cerebral infarction     Time: [id number] PT Time Calculation (min) (ACUTE ONLY): 18 min  Charges:  $Gait Training: 8-22 mins                     Earney Navy, PTA Acute Rehabilitation Services Pager: [phone] Office: [phone]     Darliss Cheney 12/10/2019, 10:52 AM

## 2019-12-10 NOTE — Assessment & Plan Note (Signed)
PT/OT at SNF °

## 2019-12-10 NOTE — Care Management Important Message (Signed)
Important Message  Patient Details  Name: Diamond Rice MRN: [id number] Date of Birth: 06/04/1971   Medicare Important Message Given:  Yes     Orbie Pyo 12/10/2019, 3:01 PM

## 2019-12-10 NOTE — Assessment & Plan Note (Signed)
Monitor BMET. 

## 2019-12-10 NOTE — Progress Notes (Signed)
NURSING HOME LOCATION:  [facility] ROOM NUMBER:  [id number]  CODE STATUS: Full code  PCP: Latanya Presser MD [place] [address]   This is a comprehensive admission note to [facility] performed on this date less than 30 days from date of admission. Included are preadmission medical/surgical history; reconciled medication list; family history; social history and comprehensive review of systems.  Corrections and additions to the records were documented. Comprehensive physical exam was also performed. Additionally a clinical summary was entered for each active diagnosis pertinent to this admission in the Problem List to enhance continuity of care.  HPI: Patient was hospitalized 4/10-4/14/2021 presenting from home with slurred speech.  Neurology consulted; TPA was not in option because of history of hemorrhagic stroke.  CT of the head revealed no acute findings.  MRI revealed a small acute lacunar infarct in the left coronary radiata with no associated hemorrhage or mass-effect.  MRA was negative for LVO.  Neurology recommended dual antiplatelet therapy for 3 weeks followed by aspirin alone.  Echo revealed grade 1 diastolic dysfunction.  Carotid duplex revealed no significant stenoses.  LDL was 95 and hemoglobin A1c 6.7%.  Statin was initiated.  Blood pressure control did improve and medications were reintroduced as blood pressure tolerated.  As of 4/12 neurology had reinitiated clonidine, hydralazine, and lisinopril. Baseline creatinine was felt to be 1-1 0.3.  Creatinine was 1.6 at presentation but did improve to 1.48.  While hospitalized substance abuse counseling was provided as she has a history of marijuana and crack cocaine abuse. UDS was positive for cocaine and tetrahydrocannabinol.  As per the patient's history she was continuing to use illicit drugs at least weekly.  She was discharged on a heart healthy/carbohydrate modified diet with nectar  thick liquids.  Past medical and surgical history: Includes COPD, obstructive sleep apnea, insulin-dependent diabetes with vascular complications, migraine headaches, essential hypertension, dyslipidemia, depression, ischemic PVD, chronic LBP, schizoaffective disorder, and polysubstance abuse.  Surgeries and procedures include cardiac cath, colonoscopy with polypectomy, toe amputations and multiple vascular procedures.  Social history: Nondrinker, she indicates she is trying to stop smoking at this time..  Family history: Reviewed.  Strong family history of stroke.   Review of systems:  Could not be completed due to nonverbal state.  She was able to follow commands.   Physical exam:  Pertinent or positive findings: Facies are blank.  Mouth is agape.  There is asymmetry of her smile with decreased expression on the left.  Initially she had bilateral rhonchi greater on the right.  These cleared for the most part with repeated deep inspirations.  She had a raspy grade [id number] systolic murmur at the left sternal border.  Abdomen is slightly protuberant.  Dorsalis pedis pulses are weaker than the posterior tibial pulses..  The left upper and lower extremities are weaker than the right.  This asymmetry  is most pronounced in the lower extremities.  She has only 2 toes on the left foot.  Clubbing of the nailbeds is suggested.  General appearance: Adequately nourished; no acute distress, increased work of breathing is present.   Lymphatic: No lymphadenopathy about the head, neck, axilla. Eyes: No conjunctival inflammation or lid edema is present. There is no scleral icterus. Ears:  External ear exam shows no significant lesions or deformities.   Nose:  External nasal examination shows no deformity or inflammation. Nasal mucosa are pink and moist without lesions, exudates Oral exam: Lips and gums are healthy appearing.There is  no oropharyngeal erythema or exudate. Neck:  No thyromegaly, masses, tenderness  noted.    Heart:  Normal rate and regular rhythm. S1 and S2 normal without gallop, click, rub.  Lungs:  without wheezes,  rales, rubs. Abdomen: Bowel sounds are normal.  Abdomen is soft and nontender with no organomegaly, hernias, masses. GU: Deferred  Extremities:  No cyanosis, edema. Neurologic exam: Balance, Rhomberg, finger to nose testing could not be completed due to clinical state Skin: Warm & dry w/o tenting. No significant lesions or rash.  See clinical summary under each active problem in the Problem List with associated updated therapeutic plan

## 2019-12-10 NOTE — Discharge Summary (Signed)
Physician Discharge Summary  Diamond Rice [id number] DOB: [birth year] DOA: 12/06/2019  PCP: [name], MD  Admit date: 12/06/2019 Discharge date: 12/10/2019  Admitted From: Home Disposition: SNF   Recommendations for Outpatient Follow-up:  1. Follow up with PCP in 1-2 weeks. Monitor BP and Tx as indicated. 2. Please obtain BMP/CBC in one week  Home Health: N/A Equipment/Devices: Per SNF Discharge Condition: Stable CODE STATUS: Full Diet recommendation: Dysphagia 2, nectar thick liquids.  Brief/Interim Summary: Diamond Rice is a 49 year old female with history of hemorrhagic stroke in 10/07/2018 with residual left sided weakness, COPD, hypertension, hyperlipidemia, diabetes mellitus type 2, OSA on CPAP, anxiety, depression, substance abuse (marijuana, crack cocaine) presented with slurred speech.  Neurology was consulted: TPA was not given because of history of hemorrhagic stroke history.  CT of the head was negative for acute finding.  MRI of the brain showed acute small lacunar infarct in the left coronary radiata with no associated hemorrhage or mass-effect.  MRA head was negative for LVO.  Admitted for stroke work-up. Neurology recommended dual antiplatelet therapy for 3 weeks followed by aspirin alone. Substance abuse counseling was provided. Please see below for full details.  Discharge Diagnoses:  Principal Problem:   Acute CVA (cerebrovascular accident) ([place]) Active Problems:   OSA (obstructive sleep apnea)   Hypokalemia   Type 2 diabetes mellitus ([place])   AKI (acute kidney injury) ([place])  Acute ischemic stroke Dysphagia: Due to CVA.  -Patient was seen by neurology and NIHSS 5. TPA not given her LKW and hemorrhagic stroke history.  -Head CT negative for acute finding. MRI brain showing a small acute lacunar infarct in the left corona radiata tracking into the posterior left lentiform. No associated hemorrhage or mass-effect. MRA head negative for  LVO -Continue aspirin and Plavix for 3 weeks as per neurology recommendations, then aspirin alone. -2D echo showed EF of 65 to 70% with grade 1 diastolic dysfunction.  Carotid duplex without significant stenoses noted. -LDL 95.  Hemoglobin 6.7. Started statin.   Hypertension: BP control improving, recommend adding back meds as indicated, though wish to avoid abrupt decrease in BP.   - Clonidine, hydralazine and lisinopril have been resumed by neurology on 12/08/2019.  Mild hypokalemia: Resolved.   AKI: Prior creatinine between 1-1.3.  Presented with creatinine of 1.6, improved to 1.48.  - Recommend recheck BMP next week.  COPD: Currently stable.  No signs of exacerbation.  OSA -Continue CPAP at night  Diabetes mellitus type 2: HbA1c 6.7% - Continue home insulin.   History of polysubstance abuse: Urine drug screen is positive for cocaine and tetrahydrocannabinol. Patient still using drugs at least once a week as per the patient. - Social worker consulted.  Counseled about cessation.   Obesity: Body mass index is 34.16 kg/m.   Discharge Instructions  Allergies as of 12/10/2019      Reactions   Morphine And Related Hives   Metformin Diarrhea      Medication List    STOP taking these medications   carvedilol 25 MG tablet Commonly known as: COREG   cloNIDine 0.3 mg/24hr patch Commonly known as: CATAPRES - Dosed in mg/24 hr   furosemide 40 MG tablet Commonly known as: LASIX   gabapentin 100 MG capsule Commonly known as: NEURONTIN   potassium chloride SA 20 MEQ tablet Commonly known as: KLOR-CON     TAKE these medications   ARIPiprazole 15 MG tablet Commonly known as: ABILIFY Take 1 tablet (15 mg total) by mouth at bedtime.  aspirin 81 MG EC tablet Take 1 tablet (81 mg total) by mouth daily.   atorvastatin 40 MG tablet Commonly known as: LIPITOR Take 1 tablet (40 mg total) by mouth daily at 6 PM.   cloNIDine 0.3 MG tablet Commonly known as:  CATAPRES Take 0.3 mg by mouth 2 (two) times daily.   clopidogrel 75 MG tablet Commonly known as: PLAVIX Take 1 tablet (75 mg total) by mouth daily. Start taking on: December 11, 2019   ferrous sulfate 325 (65 FE) MG tablet Take 1 tablet (325 mg total) by mouth daily with breakfast.   hydrALAZINE 100 MG tablet Commonly known as: APRESOLINE Take 100 mg by mouth 3 (three) times daily.   insulin detemir 100 UNIT/ML FlexPen Commonly known as: LEVEMIR Inject 15 Units into the skin 2 (two) times daily.   lisinopril 40 MG tablet Commonly known as: ZESTRIL Take 40 mg by mouth daily.   pentoxifylline 400 MG CR tablet Commonly known as: TRENTAL Take 1 tablet (400 mg total) by mouth 3 (three) times daily with meals.      Contact information for after-discharge care    Destination    [facility] .   Service: Skilled Nursing Contact information: [id number] [address] [phone]             Allergies  Allergen Reactions  . Morphine And Related Hives  . Metformin Diarrhea    Consultations:  Neurology  Procedures/Studies: MR ANGIO HEAD WO CONTRAST  Result Date: 12/06/2019 CLINICAL DATA:  49 year old female with acute on chronic small vessel disease. EXAM: MRA HEAD WITHOUT CONTRAST TECHNIQUE: Angiographic images of the Circle of Willis were obtained using MRA technique without intravenous contrast. COMPARISON:  Brain MRI today reported separately. Intracranial MRA 09/02/2018. FINDINGS: Chronically diminutive posterior circulation. Antegrade flow appears stable in diminutive distal vertebral arteries, the left terminates in PICA. The right PICA origin remains patent. The right vertebral supplies a patent but diminutive basilar artery. There is moderate to severe stenosis at the basilar artery origin which may be progressed from last year (series 1038, image 7). Fetal type bilateral PCA origins. The basilar terminates in the  SCA is which remain patent. Bilateral fetal type PCA origins. Severe right PCA P1 and left PCA P2 stenoses are redemonstrated and stable. Stable antegrade flow in both ICA siphons. Normal posterior communicating artery origins. Mild to moderate bilateral siphon anterior genu/supraclinoid irregularity appears stable with mild stenosis. Carotid termini remain patent. MCA and ACA origins remain patent but there is new moderate stenosis at the left A1 origin (series 1030, image 10). MCA M1 segments and bifurcations remain patent with mild irregularity. Visible bilateral MCA and ACA branches are stable with mild irregularity. IMPRESSION: 1. Chronically age advanced intracranial atherosclerosis. Negative for large vessel occlusion. 2. Diminutive vertebrobasilar system on the basis of fetal type PCA origins, but evidence of hemodynamically significant stenosis at the Vertebrobasilar Junction which appears progressed from last year. Chronic severe stenoses of the Right P1 and Left P2. 3. Stable mild stenoses of the bilateral ICA anterior genu. New moderate stenosis of the Left ACA origin. Electronically Signed   By: Genevie Ann M.D.   On: 12/06/2019 19:17   MR BRAIN WO CONTRAST  Result Date: 12/06/2019 CLINICAL DATA:  Code stroke. 49 year old female with slurred speech. History of prior stroke. EXAM: MRI HEAD WITHOUT CONTRAST TECHNIQUE: Multiplanar, multiecho pulse sequences of the brain and surrounding structures were obtained without intravenous contrast. COMPARISON:  Head CT  earlier today. Brain MRI 01/14/2018. FINDINGS: Brain: Oval 10 mm focus of restricted diffusion in the left corona radiata tracking into the posterior left lentiform (series 5, image 73). Faint T2 and FLAIR hyperintensity. No hemorrhage or mass effect. No other restricted diffusion. Small chronic infarct in the deep left cerebellar nuclei is new since 2019. Extensive chronic small vessel infarcts scattered in the bilateral basal ganglia, thalami,  right corona radiata. There is Wallerian degeneration in the right brainstem. Scattered chronic microhemorrhages, with abundant hemosiderin in the posterior right lentiform. Additional scattered and confluent bilateral white matter T2 and FLAIR hyperintensity. No definite cortical encephalomalacia. No midline shift, mass effect, evidence of mass lesion, ventriculomegaly, extra-axial collection or acute intracranial hemorrhage. Cervicomedullary junction and pituitary are within normal limits. Vascular: Major intracranial vascular flow voids are stable since 2019. Skull and upper cervical spine: Negative visible cervical spine and bone marrow signal. Sinuses/Orbits: Negative orbits. Paranasal Visualized paranasal sinuses and mastoids are stable and well pneumatized. Other: Visible internal auditory structures appear normal. Scalp and face soft tissues appear negative. IMPRESSION: 1. Small acute lacunar infarct in the left corona radiata tracking into the posterior left lentiform. No associated hemorrhage or mass effect. 2. Underlying very severe chronic small vessel disease. 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 Electronically Signed   By: Genevie Ann M.D.   On: 12/06/2019 19:11   DG Swallowing Func-Speech Pathology  Result Date: 12/08/2019 Objective Swallowing Evaluation: Type of Study: MBS-Modified Barium Swallow Study  Patient Details Name: Diamond Rice MRN: [id number] Date of Birth: 1970-08-30 Today's Date: 12/08/2019 Time: SLP Start Time (ACUTE ONLY): 0805 -SLP Stop Time (ACUTE ONLY): 0826 SLP Time Calculation (min) (ACUTE ONLY): 21 min Past Medical History: Past Medical History: Diagnosis Date . Anxiety  . Chronic lower back pain 04/17/2012  "just got over some; catched when I walked" . COPD (chronic obstructive pulmonary disease) ([place])  . Critical lower limb ischemia 05/27/2018 . Depression  . Headache(784.0) 04/17/2012  "~ qod; lately waking up in am w/one" . High cholesterol  . Hypertension   . Migraines 04/17/2012 . Obesity  . Sleep apnea   CPAP . Stroke ([place])  . Type II diabetes mellitus ([place]) 08/28/2002 Past Surgical History: Past Surgical History: Procedure Laterality Date . ABDOMINAL AORTOGRAM W/LOWER EXTREMITY Left 05/27/2018  Procedure: ABDOMINAL AORTOGRAM W/LOWER EXTREMITY Runoff and Possible Intervention;  Surgeon: Waynetta Sandy, MD;  Location: [facility];  Service: Cardiovascular;  Laterality: Left; . APPLICATION OF WOUND VAC Left 0000000  Procedure: APPLICATION OF WOUND VAC;  Surgeon: Waynetta Sandy, MD;  Location: [facility];  Service: Vascular;  Laterality: Left; . BIOPSY  04/04/2018  Procedure: BIOPSY;  Surgeon: [name], MD;  Location: [facility] ENDOSCOPY;  Service: Endoscopy;; . CARDIAC CATHETERIZATION  ~ 2011 . COLONOSCOPY N/A 04/04/2018  Procedure: COLONOSCOPY;  Surgeon: [name], MD;  Location: [facility] ENDOSCOPY;  Service: Endoscopy;  Laterality: N/A; . LOWER EXTREMITY ANGIOGRAM Left 05/27/2018 . PERIPHERAL VASCULAR INTERVENTION Left 05/27/2018  Procedure: PERIPHERAL VASCULAR INTERVENTION;  Surgeon: Waynetta Sandy, MD;  Location: [facility];  Service: Cardiovascular;  Laterality: Left; . POLYPECTOMY  04/04/2018  Procedure: POLYPECTOMY;  Surgeon: [name], MD;  Location: [facility] ENDOSCOPY;  Service: Endoscopy;; . TRANSMETATARSAL AMPUTATION Left 05/28/2018  Procedure: AMPUTATION TOES THREE, FOUR AND FIVE on left foot;  Surgeon: Waynetta Sandy, MD;  Location: [facility];  Service: Vascular;  Laterality: Left; . WOUND DEBRIDEMENT Left 06/06/2018  Procedure: DEBRIDEMENT WOUND LEFT FOOT;  Surgeon: Waynetta Sandy, MD;  Location: [facility];  Service: Vascular;  Laterality: Left; HPI: 49 year old female with history of hemorrhagic stroke in 10/07/2018 with residual left sided weakness, COPD, hypertension, hyperlipidemia, diabetes mellitus type 2, OSA on CPAP, anxiety, depression, substance abuse (marijuana, crack cocaine) presented with slurred  speech. MRI revealed acute small lacunar infarct in left corona radiata. BSE 1/21020 rec'd Dys 3, thin. MBS 09/18/18 oropharyngeal swallow WFL. Espohageal retention mid esophagus and w/u rec'd  Subjective: alert, cooperative Assessment / Plan / Recommendation CHL IP CLINICAL IMPRESSIONS 12/08/2019 Clinical Impression  Patient presents with mild oropharyngeal dysphagia. Oral phase is remarkable for decreased bolus cohesion, premature spillage to the vallecula and pyriform sinuses and lingual residue. When trialing a regular solid, the pt stated she was going to swallow the bolus before attempting to masticate, requiring Min cues to continue to fully masticate prior to swallowing. Pharyngeal phase is remarkable for delayed swallow initiation at the pyriform sinuses and reduced laryngeal closure.  Patient's vallecular space was also noted to be small, making it difficult for the bolus to collect there prior to the swallow. Liquids would either spill over into the larynx or to the pyriform sinsues. The small vallecular space and reduced laryngeal closure ultimately resulted in penetration/aspiration. Aspiration (PAS 7 or 8) occurred during the swallow with thin liquids. As the pt was challenged by taking large consecutive sips, she aspirated, spit out what was remaining in her oral cavity and was noted with suspected nasopharyngeal regurgitation (nasal secretions and what appeared to be some barium expelled from her nasal cavity). The fluoro was not on during suspected nasopharyngeal regurgitation, therefore cannot confirm. A chin tuck was trialed to attempt to increase the space in the vallecula, however, this was unsuccessful. Recommend Dys 2 diet, nectar thick liquids, meds crushed in puree, with full supervision. Ensure the pt is taking small, single sips at a time and is following general aspiration precautions.  SLP Visit Diagnosis Dysphagia, oropharyngeal phase (R13.12) Attention and concentration deficit following  -- Frontal lobe and executive function deficit following -- Impact on safety and function Moderate aspiration risk   CHL IP TREATMENT RECOMMENDATION 12/08/2019 Treatment Recommendations Therapy as outlined in treatment plan below   Prognosis 12/08/2019 Prognosis for Safe Diet Advancement Good Barriers to Reach Goals -- Barriers/Prognosis Comment -- CHL IP DIET RECOMMENDATION 12/08/2019 SLP Diet Recommendations Dysphagia 2 (Fine chop) solids;Nectar thick liquid Liquid Administration via Cup;Straw Medication Administration Crushed with puree Compensations Minimize environmental distractions;Slow rate;Small sips/bites Postural Changes Remain semi-upright after after feeds/meals (Comment);Seated upright at 90 degrees   CHL IP OTHER RECOMMENDATIONS 12/08/2019 Recommended Consults -- Oral Care Recommendations Oral care BID Other Recommendations Prohibited food (jello, ice cream, thin soups);Order thickener from pharmacy;Remove water pitcher   CHL IP FOLLOW UP RECOMMENDATIONS 12/08/2019 Follow up Recommendations Skilled Nursing facility   [facility] IP FREQUENCY AND DURATION 12/08/2019 Speech Therapy Frequency (ACUTE ONLY) min 2x/week Treatment Duration 2 weeks      CHL IP ORAL PHASE 12/08/2019 Oral Phase Impaired Oral - Pudding Teaspoon -- Oral - Pudding Cup -- Oral - Honey Teaspoon -- Oral - Honey Cup -- Oral - Nectar Teaspoon -- Oral - Nectar Cup Lingual/palatal residue Oral - Nectar Straw Lingual/palatal residue Oral - Thin Teaspoon -- Oral - Thin Cup Decreased bolus cohesion;Premature spillage Oral - Thin Straw Decreased bolus cohesion;Premature spillage Oral - Puree WFL Oral - Mech Soft -- Oral - Regular Delayed oral transit Oral - Multi-Consistency -- Oral - Pill -- Oral Phase - Comment --  CHL IP PHARYNGEAL PHASE 12/08/2019  Pharyngeal Phase Impaired Pharyngeal- Pudding Teaspoon -- Pharyngeal -- Pharyngeal- Pudding Cup -- Pharyngeal -- Pharyngeal- Honey Teaspoon -- Pharyngeal -- Pharyngeal- Honey Cup -- Pharyngeal -- Pharyngeal-  Nectar Teaspoon -- Pharyngeal -- Pharyngeal- Nectar Cup Penetration/Aspiration during swallow;Reduced airway/laryngeal closure Pharyngeal Material enters airway, remains ABOVE vocal cords then ejected out Pharyngeal- Nectar Straw Penetration/Aspiration during swallow;Reduced airway/laryngeal closure Pharyngeal Material enters airway, remains ABOVE vocal cords then ejected out Pharyngeal- Thin Teaspoon -- Pharyngeal -- Pharyngeal- Thin Cup Penetration/Aspiration during swallow;Trace aspiration;Delayed swallow initiation-pyriform sinuses;Compensatory strategies attempted (with notebox);Other (Comment);Reduced airway/laryngeal closure Pharyngeal Material enters airway, passes BELOW cords without attempt by patient to eject out (silent aspiration) Pharyngeal- Thin Straw Penetration/Aspiration during swallow;Trace aspiration;Delayed swallow initiation-pyriform sinuses;Compensatory strategies attempted (with notebox);Other (Comment);Reduced airway/laryngeal closure Pharyngeal Material enters airway, passes BELOW cords without attempt by patient to eject out (silent aspiration) Pharyngeal- Puree WFL Pharyngeal -- Pharyngeal- Mechanical Soft -- Pharyngeal -- Pharyngeal- Regular WFL Pharyngeal -- Pharyngeal- Multi-consistency -- Pharyngeal -- Pharyngeal- Pill -- Pharyngeal -- Pharyngeal Comment --  CHL IP CERVICAL ESOPHAGEAL PHASE 12/08/2019 Cervical Esophageal Phase WFL Pudding Teaspoon -- Pudding Cup -- Honey Teaspoon -- Honey Cup -- Nectar Teaspoon -- Nectar Cup -- Nectar Straw -- Thin Teaspoon -- Thin Cup -- Thin Straw -- Puree -- Mechanical Soft -- Regular -- Multi-consistency -- Pill -- Cervical Esophageal Comment -- Osie Bond., M.A. CCC-SLP Acute Rehabilitation Services Pager [phone] Office [phone] 12/08/2019, 9:45 AM              ECHOCARDIOGRAM COMPLETE  Result Date: 12/07/2019    ECHOCARDIOGRAM REPORT   Patient Name:   Diamond Rice Date of Exam: 12/07/2019 Medical Rec #:  [id number]         Height:        61.0 in Accession #:    [id number]        Weight:       195.0 lb Date of Birth:  09/13/1970          BSA:          1.869 m Patient Age:    65 years          BP:           168/88 mmHg Patient Gender: F                 HR:           50 bpm. Exam Location:  Inpatient Procedure: 2D Echo Indications:    Stroke 434.91 / I163.9  History:        Patient has prior history of Echocardiogram examinations, most                 recent 06/24/2018. Signs/Symptoms:Dyspnea; Risk Factors:Tobacco                 abuse, Hypertension, Diabetes and Dyslipidemia. Cocaine abuse                 Acute renal failure                 Aortic valve insufficiency.  Sonographer:    Vikki Ports Turrentine Referring Phys: [id number] [place]  1. Left ventricular ejection fraction, by estimation, is 65 to 70%. The left ventricle has normal function. The left ventricle has no regional wall motion abnormalities. There is moderate concentric left ventricular hypertrophy. Left ventricular diastolic parameters are consistent with Grade I diastolic dysfunction (impaired relaxation). Elevated left ventricular end-diastolic pressure.  2. Right ventricular systolic function is normal. The  right ventricular size is normal. Tricuspid regurgitation signal is inadequate for assessing PA pressure.  3. The mitral valve is normal in structure. Trivial mitral valve regurgitation. No evidence of mitral stenosis.  4. The aortic valve is tricuspid. Aortic valve regurgitation is mild to mdoerate. Aortic valve mean gradient measures 9.0 mmHg. Aortic valve peak gradient measures 16.4 mmHg. Aortic valve area, by VTI measures 1.53 cm. Very mild aortic stenosis.  5. The inferior vena cava is normal in size with greater than 50% respiratory variability, suggesting right atrial pressure of 3 mmHg.  6. Left atrial size was moderately dilated. FINDINGS  Left Ventricle: Left ventricular ejection fraction, by estimation, is 65 to 70%. The left ventricle has normal  function. The left ventricle has no regional wall motion abnormalities. The left ventricular internal cavity size was normal in size. There is  moderate concentric left ventricular hypertrophy. Left ventricular diastolic parameters are consistent with Grade I diastolic dysfunction (impaired relaxation). Elevated left ventricular end-diastolic pressure. Right Ventricle: The right ventricular size is normal. No increase in right ventricular wall thickness. Right ventricular systolic function is normal. Tricuspid regurgitation signal is inadequate for assessing PA pressure. Left Atrium: Left atrial size was moderately dilated. Right Atrium: Right atrial size was normal in size. Pericardium: There is no evidence of pericardial effusion. Mitral Valve: The mitral valve is normal in structure. Normal mobility of the mitral valve leaflets. Trivial mitral valve regurgitation. No evidence of mitral valve stenosis. Tricuspid Valve: The tricuspid valve is normal in structure. Tricuspid valve regurgitation is trivial. No evidence of tricuspid stenosis. Aortic Valve: The aortic valve is tricuspid. Aortic valve regurgitation is mild to moderate. Aortic regurgitation PHT measures 944 msec. There is moderate calcification of the aortic valve. Aortic valve mean gradient measures 9.0 mmHg. Aortic valve peak gradient measures 16.4 mmHg. Aortic valve area, by VTI measures 1.53 cm. Very mild aortic stenosis. Pulmonic Valve: The pulmonic valve was normal in structure. Pulmonic valve regurgitation is not visualized. No evidence of pulmonic stenosis. Aorta: The aortic root is normal in size and structure. Venous: The inferior vena cava is normal in size with greater than 50% respiratory variability, suggesting right atrial pressure of 3 mmHg. IAS/Shunts: No atrial level shunt detected by color flow Doppler.  LEFT VENTRICLE PLAX 2D LVIDd:         3.60 cm  Diastology LVIDs:         2.20 cm  LV e' lateral:   4.03 cm/s LV PW:         1.40 cm   LV E/e' lateral: 19.3 LV IVS:        1.40 cm  LV e' medial:    5.22 cm/s LVOT diam:     1.70 cm  LV E/e' medial:  14.9 LV SV:         64 LV SV Index:   34 LVOT Area:     2.27 cm  RIGHT VENTRICLE RV S prime:     15.30 cm/s TAPSE (M-mode): 2.7 cm LEFT ATRIUM             Index       RIGHT ATRIUM           Index LA diam:        4.50 cm 2.41 cm/m  RA Area:     19.90 cm LA Vol (A2C):   91.5 ml 48.97 ml/m RA Volume:   51.10 ml  27.35 ml/m LA Vol (A4C):   75.3 ml 40.30 ml/m LA Biplane  Vol: 85.9 ml 45.97 ml/m  AORTIC VALVE AV Area (Vmax):    1.48 cm AV Area (Vmean):   1.46 cm AV Area (VTI):     1.53 cm AV Vmax:           202.50 cm/s AV Vmean:          141.500 cm/s AV VTI:            0.416 m AV Peak Grad:      16.4 mmHg AV Mean Grad:      9.0 mmHg LVOT Vmax:         132.00 cm/s LVOT Vmean:        91.000 cm/s LVOT VTI:          0.280 m LVOT/AV VTI ratio: 0.67 AI PHT:            944 msec  AORTA Ao Root diam: 2.80 cm MITRAL VALVE MV Area (PHT): 2.48 cm     SHUNTS MV Decel Time: 306 msec     Systemic VTI:  0.28 m MV E velocity: 77.70 cm/s   Systemic Diam: 1.70 cm MV A velocity: 114.00 cm/s MV E/A ratio:  0.68 Fransico Him MD Electronically signed by Fransico Him MD Signature Date/Time: 12/07/2019/2:07:10 PM    Final    CT HEAD CODE STROKE WO CONTRAST  Result Date: 12/06/2019 CLINICAL DATA:  Code stroke. 49 year old female with slurred speech. History of prior stroke. EXAM: CT HEAD WITHOUT CONTRAST TECHNIQUE: Contiguous axial images were obtained from the base of the skull through the vertex without intravenous contrast. COMPARISON:  Head CT 06/26/2019. Brain MRI 09/02/2018. FINDINGS: Brain: Advanced chronic cerebral white matter and deep gray nuclei hypodensity. No acute intracranial hemorrhage identified. No midline shift, mass effect, or evidence of intracranial mass lesion. No ventriculomegaly. Small chronic infarct in the left cerebellum. No cortically based acute infarct identified. Vascular: Calcified  atherosclerosis at the skull base. No suspicious intracranial vascular hyperdensity. Skull: Stable hyperostosis. No acute osseous abnormality identified. Sinuses/Orbits: Visualized paranasal sinuses and mastoids are stable and well pneumatized. Other: Leftward gaze. Negative scalp soft tissues. ASPECTS [facility] Stroke Program Early CT Score) Total score (0-10 with 10 being normal): 10 IMPRESSION: 1. Stable CT appearance of advanced chronic small vessel disease since October. ASPECTS 10. 2. These results were communicated to Dr. Lorraine Lax at 5:37 pmon 4/10/2021by text page via the [facility] messaging system. Electronically Signed   By: Genevie Ann M.D.   On: 12/06/2019 17:38   VAS US CAROTID  Result Date: 12/08/2019 Carotid Arterial Duplex Study Indications:       CVA and Speech disturbance. Risk Factors:      Hypertension, hyperlipidemia, Diabetes, prior CVA. Other Factors:     Substance abuse. Comparison Study:  No prior study on file Performing Technologist: Sharion Dove RVS  Examination Guidelines: A complete evaluation includes B-mode imaging, spectral Doppler, color Doppler, and power Doppler as needed of all accessible portions of each vessel. Bilateral testing is considered an integral part of a complete examination. Limited examinations for reoccurring indications may be performed as noted.  Right Carotid Findings: +----------+--------+--------+--------+------------------+------------------+           PSV cm/sEDV cm/sStenosisPlaque DescriptionComments           +----------+--------+--------+--------+------------------+------------------+ CCA Prox  1       0                                 intimal  thickening +----------+--------+--------+--------+------------------+------------------+ CCA Distal1       0                                 intimal thickening +----------+--------+--------+--------+------------------+------------------+ ICA Prox  1       0               heterogenous       tortuous           +----------+--------+--------+--------+------------------+------------------+ ICA Distal1       0                                 tortuous           +----------+--------+--------+--------+------------------+------------------+ ECA       1       0                                                    +----------+--------+--------+--------+------------------+------------------+ +----------+--------+-------+--------+-------------------+           PSV cm/sEDV cmsDescribeArm Pressure (mmHG) +----------+--------+-------+--------+-------------------+ Subclavian1                                          +----------+--------+-------+--------+-------------------+ +---------+--------+-+--------+-+ VertebralPSV cm/s0EDV cm/s0 +---------+--------+-+--------+-+  Left Carotid Findings: +----------+--------+--------+--------+------------------+------------------+           PSV cm/sEDV cm/sStenosisPlaque DescriptionComments           +----------+--------+--------+--------+------------------+------------------+ CCA Prox  1       0                                 intimal thickening +----------+--------+--------+--------+------------------+------------------+ CCA Distal1       0                                 intimal thickening +----------+--------+--------+--------+------------------+------------------+ ICA Prox  1       0                                                    +----------+--------+--------+--------+------------------+------------------+ ICA Distal1       0                                 tortuous           +----------+--------+--------+--------+------------------+------------------+ ECA       1       0                                                    +----------+--------+--------+--------+------------------+------------------+ +----------+--------+--------+--------+-------------------+           PSV cm/sEDV cm/sDescribeArm  Pressure (mmHG) +----------+--------+--------+--------+-------------------+ [id number]                                           +----------+--------+--------+--------+-------------------+ +---------+--------+-+--------+-+  VertebralPSV cm/s0EDV cm/s0 +---------+--------+-+--------+-+   Summary: Right Carotid: The extracranial vessels were near-normal with only minimal wall                thickening or plaque. Left Carotid: The extracranial vessels were near-normal with only minimal wall               thickening or plaque. Vertebrals:  Bilateral vertebral arteries demonstrate antegrade flow. Subclavians: Normal flow hemodynamics were seen in bilateral subclavian              arteries. *See table(s) above for measurements and observations.  Electronically signed by Antony Contras MD on 12/08/2019 at 1:55:53 PM.    Final     Subjective: Writes to communicate, has no new complaints. Up to chair today, reporting she's not eating or drinking very much. No pain. Swallowing and speaking is difficult still but no sore throat.   Discharge Exam: Vitals:   12/10/19 0400 12/10/19 0837  BP: (!) 144/83 (!) 133/91  Pulse: 62 61  Resp: 18 18  Temp: 98.2 F (36.8 C) 98.2 F (36.8 C)  SpO2: 100% 100%   General: Pt is alert, awake, not in acute distress Cardiovascular: RRR, soft systolic murmur at the base. S1/S2 +, no rubs, no gallops Respiratory: CTA bilaterally, no wheezing, no rhonchi, no stridor. Some upper airway transmission noted. Abdominal: Soft, NT, ND, bowel sounds + Extremities: No pitting edema, no cyanosis  Labs: BNP (last 3 results) No results for input(s): BNP in the last 8760 hours. Basic Metabolic Panel: Recent Labs  Lab 12/06/19 1728 12/06/19 1728 12/06/19 1802 12/06/19 2308 12/07/19 0411 12/08/19 0342 12/09/19 0332 12/10/19 0500  NA 140   < > 143  --  144 142 141 142  K 3.2*   < > 3.3*  --  3.5 3.2* 3.8 3.7  CL 107  --   --   --  111 107 110 110  CO2 21*  --   --    --  22 23 21* 23  GLUCOSE 180*  --   --   --  104* 88 100* 85  BUN 17  --   --   --  15 14 12 12   CREATININE 1.62*  --   --   --  1.31* 1.44* 1.36* 1.48*  CALCIUM 8.8*  --   --   --  8.7* 8.9 9.0 9.3  MG  --   --   --  2.1  --  2.0 2.1 2.2   < > = values in this interval not displayed.   Liver Function Tests: Recent Labs  Lab 12/06/19 1728 12/08/19 0342  AST 17 12*  ALT 8 8  ALKPHOS 49 51  BILITOT 0.4 1.0  PROT 7.0 6.6  ALBUMIN 3.0* 3.1*   No results for input(s): LIPASE, AMYLASE in the last 168 hours. No results for input(s): AMMONIA in the last 168 hours. CBC: Recent Labs  Lab 12/06/19 1728 12/06/19 1802 12/08/19 0342 12/09/19 0332 12/10/19 0500  WBC 5.3  --  6.1 7.0 7.3  NEUTROABS 2.6  --  2.4 3.1 2.6  HGB 11.8* 12.6 10.9* 11.7* 11.8*  HCT 37.7 37.0 33.9* 37.4 37.6  MCV 74.5*  --  73.5* 73.0* 74.3*  PLT 273  --  238 263 251   Cardiac Enzymes: No results for input(s): CKTOTAL, CKMB, CKMBINDEX, TROPONINI in the last 168 hours. BNP: Invalid input(s): POCBNP CBG: Recent Labs  Lab 12/09/19 0604 12/09/19 1123 12/09/19 1555 12/09/19 2115 12/10/19  0601  GLUCAP 103* 83 101* 104* 98   D-Dimer No results for input(s): DDIMER in the last 72 hours. Hgb A1c No results for input(s): HGBA1C in the last 72 hours. Lipid Profile No results for input(s): CHOL, HDL, LDLCALC, TRIG, CHOLHDL, LDLDIRECT in the last 72 hours. Thyroid function studies No results for input(s): TSH, T4TOTAL, T3FREE, THYROIDAB in the last 72 hours.  Invalid input(s): FREET3 Anemia work up No results for input(s): VITAMINB12, FOLATE, FERRITIN, TIBC, IRON, RETICCTPCT in the last 72 hours. Urinalysis    Component Value Date/Time   COLORURINE YELLOW 12/07/2019 1450   APPEARANCEUR HAZY (A) 12/07/2019 1450   LABSPEC 1.023 12/07/2019 1450   PHURINE 5.0 12/07/2019 1450   GLUCOSEU 50 (A) 12/07/2019 1450   HGBUR NEGATIVE 12/07/2019 1450   BILIRUBINUR NEGATIVE 12/07/2019 1450   KETONESUR 5 (A)  12/07/2019 1450   PROTEINUR 30 (A) 12/07/2019 1450   UROBILINOGEN 0.2 08/28/2014 1143   NITRITE NEGATIVE 12/07/2019 1450   LEUKOCYTESUR NEGATIVE 12/07/2019 1450    Microbiology Recent Results (from the past 240 hour(s))  SARS CORONAVIRUS 2 (TAT 6-24 HRS) Nasopharyngeal Nasopharyngeal Swab     Status: None   Collection Time: 12/06/19  7:15 PM   Specimen: Nasopharyngeal Swab  Result Value Ref Range Status   SARS Coronavirus 2 NEGATIVE NEGATIVE Final    Comment: (NOTE) SARS-CoV-2 target nucleic acids are NOT DETECTED. The SARS-CoV-2 RNA is generally detectable in upper and lower respiratory specimens during the acute phase of infection. Negative results do not preclude SARS-CoV-2 infection, do not rule out co-infections with other pathogens, and should not be used as the sole basis for treatment or other patient management decisions. Negative results must be combined with clinical observations, patient history, and epidemiological information. The expected result is Negative. Fact Sheet for Patients: SugarRoll.be Fact Sheet for Healthcare Providers: https://www.woods-mathews.com/ This test is not yet approved or cleared by the [place] FDA and  has been authorized for detection and/or diagnosis of SARS-CoV-2 by FDA under an Emergency Use Authorization (EUA). This EUA will remain  in effect (meaning this test can be used) for the duration of the COVID-19 declaration under Section 56 4(b)(1) of the Act, 21 U.S.C. section 360bbb-3(b)(1), unless the authorization is terminated or revoked sooner. Performed at [facility], [place] [address], [place], [place] [address]   SARS CORONAVIRUS 2 (TAT 6-24 HRS) Nasopharyngeal Nasopharyngeal Swab     Status: None   Collection Time: 12/09/19  2:34 PM   Specimen: Nasopharyngeal Swab  Result Value Ref Range Status   SARS Coronavirus 2 NEGATIVE NEGATIVE Final    Comment: (NOTE) SARS-CoV-2 target  nucleic acids are NOT DETECTED. The SARS-CoV-2 RNA is generally detectable in upper and lower respiratory specimens during the acute phase of infection. Negative results do not preclude SARS-CoV-2 infection, do not rule out co-infections with other pathogens, and should not be used as the sole basis for treatment or other patient management decisions. Negative results must be combined with clinical observations, patient history, and epidemiological information. The expected result is Negative. Fact Sheet for Patients: SugarRoll.be Fact Sheet for Healthcare Providers: https://www.woods-mathews.com/ This test is not yet approved or cleared by the [place] FDA and  has been authorized for detection and/or diagnosis of SARS-CoV-2 by FDA under an Emergency Use Authorization (EUA). This EUA will remain  in effect (meaning this test can be used) for the duration of the COVID-19 declaration under Section 56 4(b)(1) of the Act, 21 U.S.C. section 360bbb-3(b)(1), unless the  authorization is terminated or revoked sooner. Performed at [facility], [place] [address] [place], [place] [address]     Time coordinating discharge: Approximately 40 minutes  Patrecia Pour, MD  [facility] 12/10/2019, 11:08 AM

## 2019-12-10 NOTE — Progress Notes (Signed)
Inpatient Rehab Admissions Coordinator:   Note pt going to SNF today. CIR will sign off at this time.   Shann Medal, PT, DPT Admissions Coordinator [phone] 12/10/19  11:57 AM

## 2019-12-10 NOTE — Assessment & Plan Note (Signed)
Diabetes adequately controlled as demonstrated by an A1c of 6.7%.

## 2019-12-10 NOTE — TOC Transition Note (Signed)
Transition of Care [facility]) - CM/SW Discharge Note   Patient Details  Name: Diamond Rice MRN: [id number] Date of Birth: July 24, 1971  [facility] CM/SW Contact:  Pollie Friar, RN Phone Number: 12/10/2019, 10:25 AM   Clinical Narrative:    TOC unable to get pt into a SNF in [place], [place] area. She did get accepted to [facility] and has accepted the bed offer. Pt will transport via [facility].  Maudie Mercury (cousin) is aware of d/c to [facility] today.  Bedside Rn updated and d/c packet at the desk.  Room: 310 Number for Report: [phone]   Final next level of care: Skilled Nursing Facility Barriers to Discharge: No Barriers Identified   Patient Goals and CMS Choice   CMS Medicare.gov Compare Post Acute Care list provided to:: Patient Choice offered to / list presented to : Patient  Discharge Placement              Patient chooses bed at: [facility] Patient to be transferred to facility by: [place] Name of family member notified: Kim--cousin Patient and family notified of of transfer: 12/10/19  Discharge Plan and Services In-house Referral: Clinical Social Work Discharge Planning Services: CM Consult Post Acute Care Choice: [facility], [place]                               Social Determinants of Health (SDOH) Interventions     Readmission Risk Interventions No flowsheet data found.

## 2019-12-10 NOTE — Assessment & Plan Note (Signed)
Hypertension improving; patient off carvedilol.  Titrate medications to prevent accelerated hypertension.

## 2019-12-10 NOTE — Patient Instructions (Signed)
See assessment and plan under each diagnosis in the problem list and acutely for this visit 

## 2019-12-12 ENCOUNTER — Non-Acute Institutional Stay (SKILLED_NURSING_FACILITY): Payer: Medicare Other | Admitting: Adult Health

## 2019-12-12 ENCOUNTER — Encounter: Payer: Self-pay | Admitting: Adult Health

## 2019-12-12 DIAGNOSIS — R1312 Dysphagia, oropharyngeal phase: Secondary | ICD-10-CM | POA: Diagnosis not present

## 2019-12-12 DIAGNOSIS — I10 Essential (primary) hypertension: Secondary | ICD-10-CM

## 2019-12-12 DIAGNOSIS — Z8673 Personal history of transient ischemic attack (TIA), and cerebral infarction without residual deficits: Secondary | ICD-10-CM | POA: Diagnosis not present

## 2019-12-12 NOTE — Progress Notes (Signed)
Location:  [facility] Room Number: [id number] Place of Service:  SNF (31) Provider:  [name], DNP, FNP-BC  Patient Care Team: [facility]ose, MD as PCP - General (Internal Medicine) [facility] Breeding, MD as PCP - Cardiology (Cardiology)  Extended Emergency Contact Information Primary Emergency Contact: [facility], [facility] [place] Phone: [phone] Relation: Brother Secondary Emergency Contact: [facility] Mobile Phone: [phone] Relation: Other  Code Status:  Full Code  Goals of care: Advanced Directive information Advanced Directives 06/02/2019  Does Patient Have a Medical Advance Directive? No  Would patient like information on creating a medical advance directive? -  Some encounter information is confidential and restricted. Go to Review Flowsheets activity to see all data.     Chief Complaint  Patient presents with  . Acute Visit    Patient is seen for elevated BP and difficulty swallowing medication.    HPI:  Pt is a 49 y.o. female seen today for reported difficulty swallowing medications.  She is a short-term rehabilitation resident at [facility].  She has a PMH of COPD, OSA, IDDM with vascular complications headaches, essential hypertension, dyslipidemia, depression, ischemic PVD, schizoaffective disorder and polysubstance abuse.  She currently takes Pentoxifylline ER 400 mg three times a day for PVD.  Charge nurse reported that resident has vomited earlier and not able to swallow pills. Pentoxifylline ER cannot be crushed. She has dysphagia as a result of her stroke. [facility] was consulted and recommended Cilostazol as a substitute for Pentoxifylline. BP was reported to be 195/118. She denies headache nor dizziness.  [facility] was [facility] to [facility] on 12/10/2019 post hospitalization 12/06/19 to 12/10/2019 for acute CVA.  She presented to  the hospital with slurred speech, neurology was consulted.  TPA was not given due to history of hemorrhagic stroke.  CT of the head was negative for acute finding.  MRI of the brain showed acute small lacunar infarct in the left coronary radiata with no associated hemorrhage or mass effect.  MRA head was negative for LVO.  Neurology recommended dual antiplatelet therapy for 3 weeks followed by aspirin alone.  Urine drug screen was positive for cocaine and tetrahydrocannabinol.  She was apparently using drugs at least once a week.   Past Medical History:  Diagnosis Date  . Anxiety   . Chronic lower back pain 04/17/2012   "just got over some; catched when I walked"  . COPD (chronic obstructive pulmonary disease) ([facility])   . Critical lower limb ischemia 05/27/2018  . Depression   . Headache(784.0) 04/17/2012   "~ qod; lately waking up in am w/one"  . High cholesterol   . Hypertension   . Migraines 04/17/2012  . Obesity   . Sleep apnea    CPAP  . Stroke ([facility])   . Type II diabetes mellitus ([place]) 08/28/2002   Past Surgical History:  Procedure Laterality Date  . ABDOMINAL AORTOGRAM W/LOWER EXTREMITY Left 05/27/2018   Procedure: ABDOMINAL AORTOGRAM W/LOWER EXTREMITY Runoff and Possible Intervention;  Surgeon: Waynetta Sandy, MD;  Location: [place] [facility];  Service: Cardiovascular;  Laterality: Left;  . APPLICATION OF WOUND VAC Left 06/06/2018   Procedure: APPLICATION OF WOUND VAC;  Surgeon: Waynetta Sandy, MD;  Location: [facility];  Service: Vascular;  Laterality: Left;  . BIOPSY  04/04/2018   Procedure: BIOPSY;  Surgeon: [name], MD;  Location: [facility] ENDOSCOPY;  Service: Endoscopy;;  .  CARDIAC CATHETERIZATION  ~ 2011  . COLONOSCOPY N/A 04/04/2018   Procedure: COLONOSCOPY;  Surgeon: [name], MD;  Location: [facility] ENDOSCOPY;  Service: Endoscopy;  Laterality: N/A;  . LOWER EXTREMITY ANGIOGRAM Left 05/27/2018  . PERIPHERAL VASCULAR INTERVENTION Left 05/27/2018   Procedure:  PERIPHERAL VASCULAR INTERVENTION;  Surgeon: Waynetta Sandy, MD;  Location: [place] [facility];  Service: Cardiovascular;  Laterality: Left;  . POLYPECTOMY  04/04/2018   Procedure: POLYPECTOMY;  Surgeon: [name], MD;  Location: [facility] ENDOSCOPY;  Service: Endoscopy;;  . TRANSMETATARSAL AMPUTATION Left 05/28/2018   Procedure: AMPUTATION TOES THREE, FOUR AND FIVE on left foot;  Surgeon: Waynetta Sandy, MD;  Location: [place];  Service: Vascular;  Laterality: Left;  . WOUND DEBRIDEMENT Left 06/06/2018   Procedure: DEBRIDEMENT WOUND LEFT FOOT;  Surgeon: Waynetta Sandy, MD;  Location: [place];  Service: Vascular;  Laterality: Left;    Allergies  Allergen Reactions  . Morphine And Related Hives  . Metformin Diarrhea    Outpatient Encounter Medications as of 12/12/2019  Medication Sig  . ARIPiprazole (ABILIFY) 15 MG tablet Take 1 tablet (15 mg total) by mouth at bedtime.  Marland Kitchen aspirin EC 81 MG EC tablet Take 1 tablet (81 mg total) by mouth daily.  Marland Kitchen atorvastatin (LIPITOR) 40 MG tablet Take 1 tablet (40 mg total) by mouth daily at 6 PM.  . cloNIDine (CATAPRES) 0.3 MG tablet Take 0.3 mg by mouth 2 (two) times daily.  . clopidogrel (PLAVIX) 75 MG tablet Take 1 tablet (75 mg total) by mouth daily.  . ferrous sulfate 325 (65 FE) MG tablet Take 1 tablet (325 mg total) by mouth daily with breakfast.  . hydrALAZINE (APRESOLINE) 100 MG tablet Take 100 mg by mouth 3 (three) times daily.  . Insulin Detemir (LEVEMIR) 100 UNIT/ML Pen Inject 15 Units into the skin 2 (two) times daily.  Marland Kitchen lisinopril (ZESTRIL) 40 MG tablet Take 40 mg by mouth daily.  . pentoxifylline (TRENTAL) 400 MG CR tablet Take 1 tablet (400 mg total) by mouth 3 (three) times daily with meals.   No facility-administered encounter medications on file as of 12/12/2019.    Review of Systems  GENERAL: No fever, chills or weakness MOUTH and THROAT: Denies oral discomfort, gingival pain or bleeding RESPIRATORY: no  SOB, DOE, wheezing, hemoptysis CARDIAC: No chest pain, edema or palpitations GI: No abdominal pain, diarrhea, constipation, heart burn, nausea or vomiting GU: Denies dysuria, frequency, hematuria or discharge NEUROLOGICAL: Denies dizziness, syncope or headache PSYCHIATRIC: Denies feelings of depression or anxiety. No report of hallucinations, insomnia, paranoia, or agitation    Immunization History  Administered Date(s) Administered  . Influenza Inj Mdck Quad Pf 07/16/2019  . Influenza,inj,Quad PF,6+ Mos 05/24/2013, 08/29/2014, 10/08/2015  . Influenza-Unspecified 06/22/2016, 07/01/2018  . PPD Test 11/10/2016  . Pneumococcal Polysaccharide-23 04/18/2012, 10/08/2015, 02/14/2017   Pertinent  Health Maintenance Due  Topic Date Due  . PAP SMEAR-Modifier  Never done  . OPHTHALMOLOGY EXAM  08/13/2014  . FOOT EXAM  10/15/2015  . INFLUENZA VACCINE  03/28/2020  . HEMOGLOBIN A1C  06/07/2020   Fall Risk  12/07/2014 10/14/2014  Falls in the past year? No No     Vitals:   12/12/19 1501  BP: (!) 188/96  Pulse: 71  Resp: 20  Temp: 97.9 F (36.6 C)  TempSrc: Oral  SpO2: 96%  Weight: 177 lb 12.8 oz (80.6 kg)  Height: 5\' 1"  (1.549 m)   Body mass index is 33.6 kg/m.  Physical Exam  GENERAL APPEARANCE:  Well nourished. In no acute distress. Obese SKIN:  Skin is warm and dry.  MOUTH and THROAT: Lips are without lesions. Oral mucosa is moist and without lesions. RESPIRATORY: Breathing is even & unlabored, BS CTAB. SOB when talking CARDIAC: RRR, no murmur,no extra heart sounds, no edema GI: Abdomen soft, normal BS, no masses, no follow-up NEUROLOGICAL: There is no tremor.  PSYCHIATRIC:  Affect and behavior are appropriate  Labs reviewed: Recent Labs    12/08/19 0342 12/09/19 0332 12/10/19 0500  NA 142 141 142  K 3.2* 3.8 3.7  CL 107 110 110  CO2 23 21* 23  GLUCOSE 88 100* 85  BUN 14 12 12   CREATININE 1.44* 1.36* 1.48*  CALCIUM 8.9 9.0 9.3  MG 2.0 2.1 2.2   Recent Labs     06/26/19 1638 12/06/19 1728 12/08/19 0342  AST 14* 17 12*  ALT 11 8 8   ALKPHOS 60 49 51  BILITOT 0.9 0.4 1.0  PROT 7.5 7.0 6.6  ALBUMIN 3.5 3.0* 3.1*   Recent Labs    12/08/19 0342 12/09/19 0332 12/10/19 0500  WBC 6.1 7.0 7.3  NEUTROABS 2.4 3.1 2.6  HGB 10.9* 11.7* 11.8*  HCT 33.9* 37.4 37.6  MCV 73.5* 73.0* 74.3*  PLT 238 263 251   Lab Results  Component Value Date   TSH 0.705 04/02/2018   Lab Results  Component Value Date   HGBA1C 6.7 (H) 12/07/2019   Lab Results  Component Value Date   CHOL 158 12/07/2019   HDL 45 12/07/2019   LDLCALC 95 12/07/2019   TRIG 89 12/07/2019   CHOLHDL 3.5 12/07/2019    Significant Diagnostic Results in last 30 days:  MR ANGIO HEAD WO CONTRAST  Result Date: 12/06/2019 CLINICAL DATA:  49 year old female with acute on chronic small vessel disease. EXAM: MRA HEAD WITHOUT CONTRAST TECHNIQUE: Angiographic images of the Circle of Willis were obtained using MRA technique without intravenous contrast. COMPARISON:  Brain MRI today reported separately. Intracranial MRA 09/02/2018. FINDINGS: Chronically diminutive posterior circulation. Antegrade flow appears stable in diminutive distal vertebral arteries, the left terminates in PICA. The right PICA origin remains patent. The right vertebral supplies a patent but diminutive basilar artery. There is moderate to severe stenosis at the basilar artery origin which may be progressed from last year (series 1038, image 7). Fetal type bilateral PCA origins. The basilar terminates in the SCA is which remain patent. Bilateral fetal type PCA origins. Severe right PCA P1 and left PCA P2 stenoses are redemonstrated and stable. Stable antegrade flow in both ICA siphons. Normal posterior communicating artery origins. Mild to moderate bilateral siphon anterior genu/supraclinoid irregularity appears stable with mild stenosis. Carotid termini remain patent. MCA and ACA origins remain patent but there is new moderate  stenosis at the left A1 origin (series 1030, image 10). MCA M1 segments and bifurcations remain patent with mild irregularity. Visible bilateral MCA and ACA branches are stable with mild irregularity. IMPRESSION: 1. Chronically age advanced intracranial atherosclerosis. Negative for large vessel occlusion. 2. Diminutive vertebrobasilar system on the basis of fetal type PCA origins, but evidence of hemodynamically significant stenosis at the Vertebrobasilar Junction which appears progressed from last year. Chronic severe stenoses of the Right P1 and Left P2. 3. Stable mild stenoses of the bilateral ICA anterior genu. New moderate stenosis of the Left ACA origin. Electronically Signed   By: Genevie Ann M.D.   On: 12/06/2019 19:17   MR BRAIN WO CONTRAST  Result Date: 12/06/2019 CLINICAL DATA:  Code stroke. 49 year old  female with slurred speech. History of prior stroke. EXAM: MRI HEAD WITHOUT CONTRAST TECHNIQUE: Multiplanar, multiecho pulse sequences of the brain and surrounding structures were obtained without intravenous contrast. COMPARISON:  Head CT earlier today. Brain MRI 01/14/2018. FINDINGS: Brain: Oval 10 mm focus of restricted diffusion in the left corona radiata tracking into the posterior left lentiform (series 5, image 73). Faint T2 and FLAIR hyperintensity. No hemorrhage or mass effect. No other restricted diffusion. Small chronic infarct in the deep left cerebellar nuclei is new since 2019. Extensive chronic small vessel infarcts scattered in the bilateral basal ganglia, thalami, right corona radiata. There is Wallerian degeneration in the right brainstem. Scattered chronic microhemorrhages, with abundant hemosiderin in the posterior right lentiform. Additional scattered and confluent bilateral white matter T2 and FLAIR hyperintensity. No definite cortical encephalomalacia. No midline shift, mass effect, evidence of mass lesion, ventriculomegaly, extra-axial collection or acute intracranial hemorrhage.  Cervicomedullary junction and pituitary are within normal limits. Vascular: Major intracranial vascular flow voids are stable since 2019. Skull and upper cervical spine: Negative visible cervical spine and bone marrow signal. Sinuses/Orbits: Negative orbits. Paranasal Visualized paranasal sinuses and mastoids are stable and well pneumatized. Other: Visible internal auditory structures appear normal. Scalp and face soft tissues appear negative. IMPRESSION: 1. Small acute lacunar infarct in the left corona radiata tracking into the posterior left lentiform. No associated hemorrhage or mass effect. 2. Underlying very severe chronic small vessel disease. 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 Electronically Signed   By: Genevie Ann M.D.   On: 12/06/2019 19:11   DG Swallowing Func-Speech Pathology  Result Date: 12/08/2019 Objective Swallowing Evaluation: Type of Study: MBS-Modified Barium Swallow Study  Patient Details Name: Amazin Lasswell MRN: [id number] Date of Birth: 1970/09/27 Today's Date: 12/08/2019 Time: SLP Start Time (ACUTE ONLY): 0805 -SLP Stop Time (ACUTE ONLY): 0826 SLP Time Calculation (min) (ACUTE ONLY): 21 min Past Medical History: Past Medical History: Diagnosis Date . Anxiety  . Chronic lower back pain 04/17/2012  "just got over some; catched when I walked" . COPD (chronic obstructive pulmonary disease) ([place])  . Critical lower limb ischemia 05/27/2018 . Depression  . Headache(784.0) 04/17/2012  "~ qod; lately waking up in am w/one" . High cholesterol  . Hypertension  . Migraines 04/17/2012 . Obesity  . Sleep apnea   CPAP . Stroke ([place])  . Type II diabetes mellitus ([place]) 08/28/2002 Past Surgical History: Past Surgical History: Procedure Laterality Date . ABDOMINAL AORTOGRAM W/LOWER EXTREMITY Left 05/27/2018  Procedure: ABDOMINAL AORTOGRAM W/LOWER EXTREMITY Runoff and Possible Intervention;  Surgeon: Waynetta Sandy, MD;  Location: [place] [facility];  Service: Cardiovascular;   Laterality: Left; . APPLICATION OF WOUND VAC Left 0000000  Procedure: APPLICATION OF WOUND VAC;  Surgeon: Waynetta Sandy, MD;  Location: [place];  Service: Vascular;  Laterality: Left; . BIOPSY  04/04/2018  Procedure: BIOPSY;  Surgeon: [name], MD;  Location: [facility] ENDOSCOPY;  Service: Endoscopy;; . CARDIAC CATHETERIZATION  ~ 2011 . COLONOSCOPY N/A 04/04/2018  Procedure: COLONOSCOPY;  Surgeon: [name], MD;  Location: [facility] ENDOSCOPY;  Service: Endoscopy;  Laterality: N/A; . LOWER EXTREMITY ANGIOGRAM Left 05/27/2018 . PERIPHERAL VASCULAR INTERVENTION Left 05/27/2018  Procedure: PERIPHERAL VASCULAR INTERVENTION;  Surgeon: Waynetta Sandy, MD;  Location: [place] [facility];  Service: Cardiovascular;  Laterality: Left; . POLYPECTOMY  04/04/2018  Procedure: POLYPECTOMY;  Surgeon: [name], MD;  Location: [facility] ENDOSCOPY;  Service: Endoscopy;; . TRANSMETATARSAL AMPUTATION Left 05/28/2018  Procedure: AMPUTATION TOES THREE, FOUR AND FIVE on left foot;  Surgeon: Waynetta Sandy, MD;  Location: [place];  Service: Vascular;  Laterality: Left; . WOUND DEBRIDEMENT Left 06/06/2018  Procedure: DEBRIDEMENT WOUND LEFT FOOT;  Surgeon: Waynetta Sandy, MD;  Location: [facility] OR;  Service: Vascular;  Laterality: Left; HPI: 49 year old female with history of hemorrhagic stroke in 10/07/2018 with residual left sided weakness, COPD, hypertension, hyperlipidemia, diabetes mellitus type 2, OSA on CPAP, anxiety, depression, substance abuse (marijuana, crack cocaine) presented with slurred speech. MRI revealed acute small lacunar infarct in left corona radiata. BSE 1/21020 rec'd Dys 3, thin. MBS 09/18/18 oropharyngeal swallow WFL. Espohageal retention mid esophagus and w/u rec'd  Subjective: alert, cooperative Assessment / Plan / Recommendation CHL IP CLINICAL IMPRESSIONS 12/08/2019 Clinical Impression  Patient presents with mild oropharyngeal dysphagia. Oral phase is remarkable for decreased bolus cohesion,  premature spillage to the vallecula and pyriform sinuses and lingual residue. When trialing a regular solid, the pt stated she was going to swallow the bolus before attempting to masticate, requiring Min cues to continue to fully masticate prior to swallowing. Pharyngeal phase is remarkable for delayed swallow initiation at the pyriform sinuses and reduced laryngeal closure.  Patient's vallecular space was also noted to be small, making it difficult for the bolus to collect there prior to the swallow. Liquids would either spill over into the larynx or to the pyriform sinsues. The small vallecular space and reduced laryngeal closure ultimately resulted in penetration/aspiration. Aspiration (PAS 7 or 8) occurred during the swallow with thin liquids. As the pt was challenged by taking large consecutive sips, she aspirated, spit out what was remaining in her oral cavity and was noted with suspected nasopharyngeal regurgitation (nasal secretions and what appeared to be some barium expelled from her nasal cavity). The fluoro was not on during suspected nasopharyngeal regurgitation, therefore cannot confirm. A chin tuck was trialed to attempt to increase the space in the vallecula, however, this was unsuccessful. Recommend Dys 2 diet, nectar thick liquids, meds crushed in puree, with full supervision. Ensure the pt is taking small, single sips at a time and is following general aspiration precautions.  SLP Visit Diagnosis Dysphagia, oropharyngeal phase (R13.12) Attention and concentration deficit following -- Frontal lobe and executive function deficit following -- Impact on safety and function Moderate aspiration risk   CHL IP TREATMENT RECOMMENDATION 12/08/2019 Treatment Recommendations Therapy as outlined in treatment plan below   Prognosis 12/08/2019 Prognosis for Safe Diet Advancement Good Barriers to Reach Goals -- Barriers/Prognosis Comment -- CHL IP DIET RECOMMENDATION 12/08/2019 SLP Diet Recommendations Dysphagia 2  (Fine chop) solids;Nectar thick liquid Liquid Administration via Cup;Straw Medication Administration Crushed with puree Compensations Minimize environmental distractions;Slow rate;Small sips/bites Postural Changes Remain semi-upright after after feeds/meals (Comment);Seated upright at 90 degrees   CHL IP OTHER RECOMMENDATIONS 12/08/2019 Recommended Consults -- Oral Care Recommendations Oral care BID Other Recommendations Prohibited food (jello, ice cream, thin soups);Order thickener from pharmacy;Remove water pitcher   CHL IP FOLLOW UP RECOMMENDATIONS 12/08/2019 Follow up Recommendations Skilled Nursing facility   [facility] IP FREQUENCY AND DURATION 12/08/2019 Speech Therapy Frequency (ACUTE ONLY) min 2x/week Treatment Duration 2 weeks      CHL IP ORAL PHASE 12/08/2019 Oral Phase Impaired Oral - Pudding Teaspoon -- Oral - Pudding Cup -- Oral - Honey Teaspoon -- Oral - Honey Cup -- Oral - Nectar Teaspoon -- Oral - Nectar Cup Lingual/palatal residue Oral - Nectar Straw Lingual/palatal residue Oral - Thin Teaspoon -- Oral - Thin Cup Decreased bolus cohesion;Premature spillage Oral - Thin Straw Decreased bolus cohesion;Premature spillage Oral -  Puree [facility] Oral - Mech Soft -- Oral - Regular Delayed oral transit Oral - Multi-Consistency -- Oral - Pill -- Oral Phase - Comment --  CHL IP PHARYNGEAL PHASE 12/08/2019 Pharyngeal Phase Impaired Pharyngeal- Pudding Teaspoon -- Pharyngeal -- Pharyngeal- Pudding Cup -- Pharyngeal -- Pharyngeal- Honey Teaspoon -- Pharyngeal -- Pharyngeal- Honey Cup -- Pharyngeal -- Pharyngeal- Nectar Teaspoon -- Pharyngeal -- Pharyngeal- Nectar Cup Penetration/Aspiration during swallow;Reduced airway/laryngeal closure Pharyngeal Material enters airway, remains ABOVE vocal cords then ejected out Pharyngeal- Nectar Straw Penetration/Aspiration during swallow;Reduced airway/laryngeal closure Pharyngeal Material enters airway, remains ABOVE vocal cords then ejected out Pharyngeal- Thin Teaspoon -- Pharyngeal --  Pharyngeal- Thin Cup Penetration/Aspiration during swallow;Trace aspiration;Delayed swallow initiation-pyriform sinuses;Compensatory strategies attempted (with notebox);Other (Comment);Reduced airway/laryngeal closure Pharyngeal Material enters airway, passes BELOW cords without attempt by patient to eject out (silent aspiration) Pharyngeal- Thin Straw Penetration/Aspiration during swallow;Trace aspiration;Delayed swallow initiation-pyriform sinuses;Compensatory strategies attempted (with notebox);Other (Comment);Reduced airway/laryngeal closure Pharyngeal Material enters airway, passes BELOW cords without attempt by patient to eject out (silent aspiration) Pharyngeal- Puree WFL Pharyngeal -- Pharyngeal- Mechanical Soft -- Pharyngeal -- Pharyngeal- Regular WFL Pharyngeal -- Pharyngeal- Multi-consistency -- Pharyngeal -- Pharyngeal- Pill -- Pharyngeal -- Pharyngeal Comment --  CHL IP CERVICAL ESOPHAGEAL PHASE 12/08/2019 Cervical Esophageal Phase WFL Pudding Teaspoon -- Pudding Cup -- Honey Teaspoon -- Honey Cup -- Nectar Teaspoon -- Nectar Cup -- Nectar Straw -- Thin Teaspoon -- Thin Cup -- Thin Straw -- Puree -- Mechanical Soft -- Regular -- Multi-consistency -- Pill -- Cervical Esophageal Comment -- Osie Bond., M.A. CCC-SLP Acute Rehabilitation Services Pager [phone] Office [phone] 12/08/2019, 9:45 AM              ECHOCARDIOGRAM COMPLETE  Result Date: 12/07/2019    ECHOCARDIOGRAM REPORT   Patient Name:   LATRESSA BANGE Date of Exam: 12/07/2019 Medical Rec #:  [id number]         Height:       61.0 in Accession #:    [id number]        Weight:       195.0 lb Date of Birth:  03-05-1971          BSA:          1.869 m Patient Age:    54 years          BP:           168/88 mmHg Patient Gender: F                 HR:           50 bpm. Exam Location:  Inpatient Procedure: 2D Echo Indications:    Stroke 434.91 / I163.9  History:        Patient has prior history of Echocardiogram examinations, most                  recent 06/24/2018. Signs/Symptoms:Dyspnea; Risk Factors:Tobacco                 abuse, Hypertension, Diabetes and Dyslipidemia. Cocaine abuse                 Acute renal failure                 Aortic valve insufficiency.  Sonographer:    Vikki Ports Turrentine Referring Phys: [id number] [place]  1. Left ventricular ejection fraction, by estimation, is 65 to 70%. The left ventricle has normal function. The left ventricle has no regional wall motion abnormalities. There is  moderate concentric left ventricular hypertrophy. Left ventricular diastolic parameters are consistent with Grade I diastolic dysfunction (impaired relaxation). Elevated left ventricular end-diastolic pressure.  2. Right ventricular systolic function is normal. The right ventricular size is normal. Tricuspid regurgitation signal is inadequate for assessing PA pressure.  3. The mitral valve is normal in structure. Trivial mitral valve regurgitation. No evidence of mitral stenosis.  4. The aortic valve is tricuspid. Aortic valve regurgitation is mild to mdoerate. Aortic valve mean gradient measures 9.0 mmHg. Aortic valve peak gradient measures 16.4 mmHg. Aortic valve area, by VTI measures 1.53 cm. Very mild aortic stenosis.  5. The inferior vena cava is normal in size with greater than 50% respiratory variability, suggesting right atrial pressure of 3 mmHg.  6. Left atrial size was moderately dilated. FINDINGS  Left Ventricle: Left ventricular ejection fraction, by estimation, is 65 to 70%. The left ventricle has normal function. The left ventricle has no regional wall motion abnormalities. The left ventricular internal cavity size was normal in size. There is  moderate concentric left ventricular hypertrophy. Left ventricular diastolic parameters are consistent with Grade I diastolic dysfunction (impaired relaxation). Elevated left ventricular end-diastolic pressure. Right Ventricle: The right ventricular size is normal. No increase  in right ventricular wall thickness. Right ventricular systolic function is normal. Tricuspid regurgitation signal is inadequate for assessing PA pressure. Left Atrium: Left atrial size was moderately dilated. Right Atrium: Right atrial size was normal in size. Pericardium: There is no evidence of pericardial effusion. Mitral Valve: The mitral valve is normal in structure. Normal mobility of the mitral valve leaflets. Trivial mitral valve regurgitation. No evidence of mitral valve stenosis. Tricuspid Valve: The tricuspid valve is normal in structure. Tricuspid valve regurgitation is trivial. No evidence of tricuspid stenosis. Aortic Valve: The aortic valve is tricuspid. Aortic valve regurgitation is mild to moderate. Aortic regurgitation PHT measures 944 msec. There is moderate calcification of the aortic valve. Aortic valve mean gradient measures 9.0 mmHg. Aortic valve peak gradient measures 16.4 mmHg. Aortic valve area, by VTI measures 1.53 cm. Very mild aortic stenosis. Pulmonic Valve: The pulmonic valve was normal in structure. Pulmonic valve regurgitation is not visualized. No evidence of pulmonic stenosis. Aorta: The aortic root is normal in size and structure. Venous: The inferior vena cava is normal in size with greater than 50% respiratory variability, suggesting right atrial pressure of 3 mmHg. IAS/Shunts: No atrial level shunt detected by color flow Doppler.  LEFT VENTRICLE PLAX 2D LVIDd:         3.60 cm  Diastology LVIDs:         2.20 cm  LV e' lateral:   4.03 cm/s LV PW:         1.40 cm  LV E/e' lateral: 19.3 LV IVS:        1.40 cm  LV e' medial:    5.22 cm/s LVOT diam:     1.70 cm  LV E/e' medial:  14.9 LV SV:         64 LV SV Index:   34 LVOT Area:     2.27 cm  RIGHT VENTRICLE RV S prime:     15.30 cm/s TAPSE (M-mode): 2.7 cm LEFT ATRIUM             Index       RIGHT ATRIUM           Index LA diam:        4.50 cm 2.41 cm/m  RA Area:  19.90 cm LA Vol (A2C):   91.5 ml 48.97 ml/m RA Volume:    51.10 ml  27.35 ml/m LA Vol (A4C):   75.3 ml 40.30 ml/m LA Biplane Vol: 85.9 ml 45.97 ml/m  AORTIC VALVE AV Area (Vmax):    1.48 cm AV Area (Vmean):   1.46 cm AV Area (VTI):     1.53 cm AV Vmax:           202.50 cm/s AV Vmean:          141.500 cm/s AV VTI:            0.416 m AV Peak Grad:      16.4 mmHg AV Mean Grad:      9.0 mmHg LVOT Vmax:         132.00 cm/s LVOT Vmean:        91.000 cm/s LVOT VTI:          0.280 m LVOT/AV VTI ratio: 0.67 AI PHT:            944 msec  AORTA Ao Root diam: 2.80 cm MITRAL VALVE MV Area (PHT): 2.48 cm     SHUNTS MV Decel Time: 306 msec     Systemic VTI:  0.28 m MV E velocity: 77.70 cm/s   Systemic Diam: 1.70 cm MV A velocity: 114.00 cm/s MV E/A ratio:  0.68 Fransico Him MD Electronically signed by Fransico Him MD Signature Date/Time: 12/07/2019/2:07:10 PM    Final    CT HEAD CODE STROKE WO CONTRAST  Result Date: 12/06/2019 CLINICAL DATA:  Code stroke. 49 year old female with slurred speech. History of prior stroke. EXAM: CT HEAD WITHOUT CONTRAST TECHNIQUE: Contiguous axial images were obtained from the base of the skull through the vertex without intravenous contrast. COMPARISON:  Head CT 06/26/2019. Brain MRI 09/02/2018. FINDINGS: Brain: Advanced chronic cerebral white matter and deep gray nuclei hypodensity. No acute intracranial hemorrhage identified. No midline shift, mass effect, or evidence of intracranial mass lesion. No ventriculomegaly. Small chronic infarct in the left cerebellum. No cortically based acute infarct identified. Vascular: Calcified atherosclerosis at the skull base. No suspicious intracranial vascular hyperdensity. Skull: Stable hyperostosis. No acute osseous abnormality identified. Sinuses/Orbits: Visualized paranasal sinuses and mastoids are stable and well pneumatized. Other: Leftward gaze. Negative scalp soft tissues. ASPECTS [facility] Stroke Program Early CT Score) Total score (0-10 with 10 being normal): 10 IMPRESSION: 1. Stable CT appearance of  advanced chronic small vessel disease since October. ASPECTS 10. 2. These results were communicated to Dr. Lorraine Lax at 5:37 pmon 4/10/2021by text page via the [facility] messaging system. Electronically Signed   By: Genevie Ann M.D.   On: 12/06/2019 17:38   VAS US CAROTID  Result Date: 12/08/2019 Carotid Arterial Duplex Study Indications:       CVA and Speech disturbance. Risk Factors:      Hypertension, hyperlipidemia, Diabetes, prior CVA. Other Factors:     Substance abuse. Comparison Study:  No prior study on file Performing Technologist: Sharion Dove RVS  Examination Guidelines: A complete evaluation includes B-mode imaging, spectral Doppler, color Doppler, and power Doppler as needed of all accessible portions of each vessel. Bilateral testing is considered an integral part of a complete examination. Limited examinations for reoccurring indications may be performed as noted.  Right Carotid Findings: +----------+--------+--------+--------+------------------+------------------+           PSV cm/sEDV cm/sStenosisPlaque DescriptionComments           +----------+--------+--------+--------+------------------+------------------+ CCA Prox  1       0  intimal thickening +----------+--------+--------+--------+------------------+------------------+ CCA Distal1       0                                 intimal thickening +----------+--------+--------+--------+------------------+------------------+ ICA Prox  1       0               heterogenous      tortuous           +----------+--------+--------+--------+------------------+------------------+ ICA Distal1       0                                 tortuous           +----------+--------+--------+--------+------------------+------------------+ ECA       1       0                                                    +----------+--------+--------+--------+------------------+------------------+  +----------+--------+-------+--------+-------------------+           PSV cm/sEDV cmsDescribeArm Pressure (mmHG) +----------+--------+-------+--------+-------------------+ Subclavian1                                          +----------+--------+-------+--------+-------------------+ +---------+--------+-+--------+-+ VertebralPSV cm/s0EDV cm/s0 +---------+--------+-+--------+-+  Left Carotid Findings: +----------+--------+--------+--------+------------------+------------------+           PSV cm/sEDV cm/sStenosisPlaque DescriptionComments           +----------+--------+--------+--------+------------------+------------------+ CCA Prox  1       0                                 intimal thickening +----------+--------+--------+--------+------------------+------------------+ CCA Distal1       0                                 intimal thickening +----------+--------+--------+--------+------------------+------------------+ ICA Prox  1       0                                                    +----------+--------+--------+--------+------------------+------------------+ ICA Distal1       0                                 tortuous           +----------+--------+--------+--------+------------------+------------------+ ECA       1       0                                                    +----------+--------+--------+--------+------------------+------------------+ +----------+--------+--------+--------+-------------------+           PSV cm/sEDV cm/sDescribeArm Pressure (mmHG) +----------+--------+--------+--------+-------------------+ [id number]                                           +----------+--------+--------+--------+-------------------+ +---------+--------+-+--------+-+  VertebralPSV cm/s0EDV cm/s0 +---------+--------+-+--------+-+   Summary: Right Carotid: The extracranial vessels were near-normal with only minimal wall                 thickening or plaque. Left Carotid: The extracranial vessels were near-normal with only minimal wall               thickening or plaque. Vertebrals:  Bilateral vertebral arteries demonstrate antegrade flow. Subclavians: Normal flow hemodynamics were seen in bilateral subclavian              arteries. *See table(s) above for measurements and observations.  Electronically signed by Antony Contras MD on 12/08/2019 at 1:55:53 PM.    Final     Assessment/Plan  1. Uncontrolled hypertension - BP 195/118, will add Amlodipine 10 mg 1 tab daily -Continue clonidine 0.3 mg 1 tab twice a day and Zestril 40 mg 1 tab daily -Monitor BPs  2. Oropharyngeal dysphagia - will discontinue pentoxifylline and start Cilostazol 100 mg twice daily which can be crushed and mixed with applesauce for easier swallowing -Continue dysphagia 2 with nectar thick liquids -Continue speech therapy treatments -Aspiration precautions  3. History of CVA (cerebrovascular accident) -Stable, continue Plavix 75 mg 1 tab daily and aspirin  81 mg 1 tab daily -Continue PT and OT for therapeutic strengthening exercises, fall precautions   Family/ staff Communication: Discussed plan of care with resident and charge nurse.    Labs/tests ordered:  None   Goals of care:   Short-term rehabilitation.    [name], DNP, FNP-BC [facility] [phone] (Monday-Friday 8:00 a.m. - 5:00 p.m.) [phone] (after hours)

## 2019-12-15 ENCOUNTER — Observation Stay (HOSPITAL_COMMUNITY)
Admission: EM | Admit: 2019-12-15 | Discharge: 2019-12-16 | Disposition: A | Discharge status: Home / Self Care | Payer: Medicare Other | Attending: Internal Medicine | Admitting: Internal Medicine

## 2019-12-15 ENCOUNTER — Other Ambulatory Visit: Payer: Self-pay

## 2019-12-15 ENCOUNTER — Emergency Department (HOSPITAL_COMMUNITY): Payer: Medicare Other

## 2019-12-15 ENCOUNTER — Encounter (HOSPITAL_COMMUNITY): Payer: Self-pay

## 2019-12-15 DIAGNOSIS — E785 Hyperlipidemia, unspecified: Secondary | ICD-10-CM | POA: Diagnosis not present

## 2019-12-15 DIAGNOSIS — E1151 Type 2 diabetes mellitus with diabetic peripheral angiopathy without gangrene: Secondary | ICD-10-CM | POA: Diagnosis not present

## 2019-12-15 DIAGNOSIS — R4701 Aphasia: Secondary | ICD-10-CM | POA: Diagnosis not present

## 2019-12-15 DIAGNOSIS — R0789 Other chest pain: Secondary | ICD-10-CM | POA: Diagnosis not present

## 2019-12-15 DIAGNOSIS — I10 Essential (primary) hypertension: Secondary | ICD-10-CM | POA: Diagnosis not present

## 2019-12-15 DIAGNOSIS — I6381 Other cerebral infarction due to occlusion or stenosis of small artery: Secondary | ICD-10-CM | POA: Insufficient documentation

## 2019-12-15 DIAGNOSIS — Z6835 Body mass index (BMI) 35.0-35.9, adult: Secondary | ICD-10-CM

## 2019-12-15 DIAGNOSIS — Z7902 Long term (current) use of antithrombotics/antiplatelets: Secondary | ICD-10-CM | POA: Insufficient documentation

## 2019-12-15 DIAGNOSIS — E66812 Obesity, class 2: Secondary | ICD-10-CM

## 2019-12-15 DIAGNOSIS — F250 Schizoaffective disorder, bipolar type: Secondary | ICD-10-CM | POA: Diagnosis present

## 2019-12-15 DIAGNOSIS — G8194 Hemiplegia, unspecified affecting left nondominant side: Secondary | ICD-10-CM | POA: Diagnosis present

## 2019-12-15 DIAGNOSIS — Z7982 Long term (current) use of aspirin: Secondary | ICD-10-CM | POA: Insufficient documentation

## 2019-12-15 DIAGNOSIS — Z8631 Personal history of diabetic foot ulcer: Secondary | ICD-10-CM | POA: Insufficient documentation

## 2019-12-15 DIAGNOSIS — I169 Hypertensive crisis, unspecified: Secondary | ICD-10-CM | POA: Insufficient documentation

## 2019-12-15 DIAGNOSIS — F419 Anxiety disorder, unspecified: Secondary | ICD-10-CM | POA: Insufficient documentation

## 2019-12-15 DIAGNOSIS — F0630 Mood disorder due to known physiological condition, unspecified: Secondary | ICD-10-CM | POA: Diagnosis not present

## 2019-12-15 DIAGNOSIS — I119 Hypertensive heart disease without heart failure: Secondary | ICD-10-CM | POA: Diagnosis not present

## 2019-12-15 DIAGNOSIS — E119 Type 2 diabetes mellitus without complications: Secondary | ICD-10-CM

## 2019-12-15 DIAGNOSIS — G4733 Obstructive sleep apnea (adult) (pediatric): Secondary | ICD-10-CM | POA: Diagnosis not present

## 2019-12-15 DIAGNOSIS — F1994 Other psychoactive substance use, unspecified with psychoactive substance-induced mood disorder: Secondary | ICD-10-CM | POA: Diagnosis present

## 2019-12-15 DIAGNOSIS — Z87891 Personal history of nicotine dependence: Secondary | ICD-10-CM | POA: Insufficient documentation

## 2019-12-15 DIAGNOSIS — Z20822 Contact with and (suspected) exposure to covid-19: Secondary | ICD-10-CM | POA: Diagnosis not present

## 2019-12-15 DIAGNOSIS — Z794 Long term (current) use of insulin: Secondary | ICD-10-CM | POA: Insufficient documentation

## 2019-12-15 DIAGNOSIS — R079 Chest pain, unspecified: Secondary | ICD-10-CM | POA: Diagnosis not present

## 2019-12-15 DIAGNOSIS — F329 Major depressive disorder, single episode, unspecified: Secondary | ICD-10-CM | POA: Diagnosis not present

## 2019-12-15 DIAGNOSIS — E7800 Pure hypercholesterolemia, unspecified: Secondary | ICD-10-CM | POA: Insufficient documentation

## 2019-12-15 DIAGNOSIS — Z89422 Acquired absence of other left toe(s): Secondary | ICD-10-CM | POA: Insufficient documentation

## 2019-12-15 DIAGNOSIS — Z885 Allergy status to narcotic agent status: Secondary | ICD-10-CM | POA: Insufficient documentation

## 2019-12-15 DIAGNOSIS — Z8673 Personal history of transient ischemic attack (TIA), and cerebral infarction without residual deficits: Secondary | ICD-10-CM | POA: Diagnosis not present

## 2019-12-15 DIAGNOSIS — Z79899 Other long term (current) drug therapy: Secondary | ICD-10-CM | POA: Insufficient documentation

## 2019-12-15 DIAGNOSIS — Z888 Allergy status to other drugs, medicaments and biological substances status: Secondary | ICD-10-CM | POA: Insufficient documentation

## 2019-12-15 DIAGNOSIS — G43909 Migraine, unspecified, not intractable, without status migrainosus: Secondary | ICD-10-CM | POA: Diagnosis not present

## 2019-12-15 DIAGNOSIS — J449 Chronic obstructive pulmonary disease, unspecified: Secondary | ICD-10-CM | POA: Diagnosis present

## 2019-12-15 LAB — CBC
HCT: 37.3 % (ref 36.0–46.0)
Hemoglobin: 11.6 g/dL — ABNORMAL LOW (ref 12.0–15.0)
MCH: 23 pg — ABNORMAL LOW (ref 26.0–34.0)
MCHC: 31.1 g/dL (ref 30.0–36.0)
MCV: 73.9 fL — ABNORMAL LOW (ref 80.0–100.0)
Platelets: 222 10*3/uL (ref 150–400)
RBC: 5.05 MIL/uL (ref 3.87–5.11)
RDW: 16.1 % — ABNORMAL HIGH (ref 11.5–15.5)
WBC: 5.4 10*3/uL (ref 4.0–10.5)
nRBC: 0 % (ref 0.0–0.2)

## 2019-12-15 LAB — BASIC METABOLIC PANEL
Anion gap: 11 (ref 5–15)
BUN: 15 mg/dL (ref 6–20)
CO2: 22 mmol/L (ref 22–32)
Calcium: 9.1 mg/dL (ref 8.9–10.3)
Chloride: 107 mmol/L (ref 98–111)
Creatinine, Ser: 1.26 mg/dL — ABNORMAL HIGH (ref 0.44–1.00)
GFR calc Af Amer: 59 mL/min — ABNORMAL LOW (ref >60)
GFR calc non Af Amer: 51 mL/min — ABNORMAL LOW (ref >60)
Glucose, Bld: 132 mg/dL — ABNORMAL HIGH (ref 70–99)
Potassium: 3.5 mmol/L (ref 3.5–5.1)
Sodium: 140 mmol/L (ref 135–145)

## 2019-12-15 LAB — TROPONIN I (HIGH SENSITIVITY): Troponin I (High Sensitivity): <2 ng/L (ref <18)

## 2019-12-15 LAB — CBG MONITORING, ED: Glucose-Capillary: 147 mg/dL — ABNORMAL HIGH (ref 70–99)

## 2019-12-15 MED ORDER — FLEET ENEMA 7-19 GM/118ML RE ENEM: 1.0000 | ENEMA | Freq: Every day | RECTAL | Status: DC | PRN

## 2019-12-15 MED ORDER — CLONIDINE HCL 0.2 MG PO TABS
0.3000 mg | ORAL_TABLET | Freq: Two times a day (BID) | ORAL | Status: DC
  Administered 2019-12-16: 0.3 mg via ORAL
  Filled 2019-12-15: qty 1

## 2019-12-15 MED ORDER — INSULIN ASPART 100 UNIT/ML ~~LOC~~ SOLN: 0.0000 [IU] | Freq: Three times a day (TID) | SUBCUTANEOUS | Status: DC

## 2019-12-15 MED ORDER — FERROUS SULFATE 325 (65 FE) MG PO TABS: 325.0000 mg | ORAL_TABLET | Freq: Every day | ORAL | Status: DC

## 2019-12-15 MED ORDER — BISACODYL 10 MG RE SUPP: 10.0000 mg | Freq: Every day | RECTAL | Status: DC | PRN

## 2019-12-15 MED ORDER — INSULIN DETEMIR 100 UNIT/ML ~~LOC~~ SOLN
15.0000 [IU] | Freq: Two times a day (BID) | SUBCUTANEOUS | Status: DC
  Administered 2019-12-16 (×2): 15 [IU] via SUBCUTANEOUS
  Filled 2019-12-15 (×4): qty 0.15

## 2019-12-15 MED ORDER — ASPIRIN 81 MG PO TBEC
81.0000 mg | DELAYED_RELEASE_TABLET | Freq: Every day | ORAL | Status: DC
  Filled 2019-12-15 (×2): qty 1

## 2019-12-15 MED ORDER — CILOSTAZOL 100 MG PO TABS
100.0000 mg | ORAL_TABLET | Freq: Two times a day (BID) | ORAL | Status: DC
  Administered 2019-12-16 (×2): 100 mg via ORAL
  Filled 2019-12-15 (×3): qty 1

## 2019-12-15 MED ORDER — HYDRALAZINE HCL 25 MG PO TABS
100.0000 mg | ORAL_TABLET | Freq: Three times a day (TID) | ORAL | Status: DC
  Administered 2019-12-16 (×2): 100 mg via ORAL
  Filled 2019-12-15 (×2): qty 4

## 2019-12-15 MED ORDER — INSULIN ASPART 100 UNIT/ML ~~LOC~~ SOLN: 0.0000 [IU] | Freq: Every day | SUBCUTANEOUS | Status: DC

## 2019-12-15 MED ORDER — CLOPIDOGREL BISULFATE 75 MG PO TABS
75.0000 mg | ORAL_TABLET | Freq: Every day | ORAL | Status: DC
  Administered 2019-12-16: 75 mg via ORAL
  Filled 2019-12-15: qty 1

## 2019-12-15 MED ORDER — ONDANSETRON HCL 4 MG/2ML IJ SOLN: 4.0000 mg | Freq: Four times a day (QID) | INTRAMUSCULAR | Status: DC | PRN

## 2019-12-15 MED ORDER — ARIPIPRAZOLE 5 MG PO TABS
15.0000 mg | ORAL_TABLET | Freq: Every day | ORAL | Status: DC
  Administered 2019-12-16: 15 mg via ORAL
  Filled 2019-12-15: qty 1

## 2019-12-15 MED ORDER — LISINOPRIL 20 MG PO TABS
40.0000 mg | ORAL_TABLET | Freq: Every day | ORAL | Status: DC
  Administered 2019-12-16: 40 mg via ORAL
  Filled 2019-12-15: qty 2

## 2019-12-15 MED ORDER — PENTOXIFYLLINE ER 400 MG PO TBCR: 400.0000 mg | EXTENDED_RELEASE_TABLET | Freq: Three times a day (TID) | ORAL | Status: DC

## 2019-12-15 MED ORDER — RESOURCE THICKENUP CLEAR PO POWD
ORAL | Status: DC | PRN
  Filled 2019-12-15: qty 125

## 2019-12-15 MED ORDER — FENTANYL CITRATE (PF) 100 MCG/2ML IJ SOLN
25.0000 ug | Freq: Once | INTRAMUSCULAR | Status: AC
  Administered 2019-12-15: 25 ug via INTRAVENOUS
  Filled 2019-12-15: qty 2

## 2019-12-15 MED ORDER — AMLODIPINE BESYLATE 5 MG PO TABS
10.0000 mg | ORAL_TABLET | Freq: Every day | ORAL | Status: DC
  Administered 2019-12-16: 10 mg via ORAL
  Filled 2019-12-15: qty 2

## 2019-12-15 MED ORDER — ACETAMINOPHEN 325 MG PO TABS: 650.0000 mg | ORAL_TABLET | ORAL | Status: DC | PRN

## 2019-12-15 MED ORDER — MAGNESIUM HYDROXIDE 400 MG/5ML PO SUSP
30.0000 mL | Freq: Every day | ORAL | Status: DC | PRN
  Filled 2019-12-15: qty 30

## 2019-12-15 MED ORDER — ENOXAPARIN SODIUM 80 MG/0.8ML ~~LOC~~ SOLN
1.0000 mg/kg | Freq: Two times a day (BID) | SUBCUTANEOUS | Status: DC
  Administered 2019-12-16 (×2): 80 mg via SUBCUTANEOUS
  Filled 2019-12-15 (×2): qty 0.8

## 2019-12-15 MED ORDER — ATORVASTATIN CALCIUM 40 MG PO TABS: 40.0000 mg | ORAL_TABLET | Freq: Every day | ORAL | Status: DC

## 2019-12-15 NOTE — ED Triage Notes (Signed)
Pt came in [facility] from [place] with c/o of CP that was a sudden onset. Pt has a speech deficit d/t previous stroke. Initial BP with EMS was 240/120, after 1Nitro was given BP was 156/96. 100HR. 324ASA given 18GIV RAC

## 2019-12-15 NOTE — ED Provider Notes (Signed)
[facility] EMERGENCY DEPARTMENT Provider Note   CSN: [id number] Arrival date & time: 12/15/19  2036     History No chief complaint on file.   Diamond Rice is a 49 y.o. female.  49 year old female with history of diabetes hypertension, CVA who presents with chest pain that began at rest today.  Had substernal heaviness was lasted for several minutes.  Had associated dyspnea but no diaphoresis or nausea or vomiting.  No recent fever cough.  Patient lives in a facility and EMS was called and patient's blood pressure was 240/120.  Patient given nitroglycerin and blood pressure improved to 156/96.  Chest pain since resolved.        Past Medical History:  Diagnosis Date  . Anxiety   . Chronic lower back pain 04/17/2012   "just got over some; catched when I walked"  . COPD (chronic obstructive pulmonary disease) ([place])   . Critical lower limb ischemia 05/27/2018  . Depression   . Headache(784.0) 04/17/2012   "~ qod; lately waking up in am w/one"  . High cholesterol   . Hypertension   . Migraines 04/17/2012  . Obesity   . Sleep apnea    CPAP  . Stroke ([place])   . Type II diabetes mellitus ([place]) 08/28/2002    Patient Active Problem List   Diagnosis Date Noted  . Acute CVA (cerebrovascular accident) ([place]) 12/06/2019  . AKI (acute kidney injury) ([place]) 12/06/2019  . LVH (left ventricular hypertrophy) 08/10/2019  . Snoring 02/19/2019  . Hemorrhagic stroke ([place])   . Diastolic dysfunction   . Type 2 diabetes mellitus ([place])   . Anemia of chronic disease   . Chronic obstructive pulmonary disease ([place])   . ICH (intracerebral hemorrhage) ([place]) 08/30/2018  . Hypertensive heart disease without heart failure 07/04/2018  . Nonrheumatic aortic valve insufficiency 07/04/2018  . Esophageal abnormality   . Class 2 severe obesity due to excess calories with serious comorbidity and body mass index (BMI) of 35.0 to 35.9 in adult [facility])   . Peripheral vascular disease ([place])     . History of osteomyelitis   . History of amputation of lesser toe ([place])   . Pulmonary embolism ([place]) 06/22/2018  . Microcytic anemia 06/17/2018  . At risk for adverse drug reaction 06/11/2018  . Critical lower limb ischemia 05/27/2018  . GI bleeding 04/02/2018  . Acute renal failure (ARF) ([place]) 04/02/2018  . Hypokalemia 04/02/2018  . Polysubstance abuse ([place]) 04/02/2018  . Diabetic foot ulcer ([place]) 04/02/2018  . Cannabis use disorder, moderate, dependence ([place]) 05/19/2017  . Cocaine use disorder, moderate, dependence ([place]) 05/19/2017  . Schizoaffective disorder, bipolar type ([place]) 10/07/2015  . OSA (obstructive sleep apnea) 06/09/2015  . Vitamin D deficiency 01/06/2015  . Bilateral knee pain 01/05/2015  . Numbness and tingling in right hand 01/05/2015  . Insomnia 12/07/2014  . Right-sided lacunar infarction ([place]) 09/15/2014  . Left hemiparesis ([place]) 09/15/2014  . Dyspnea   . Back pain 09/11/2013  . Psychoactive substance-induced organic mood disorder ([place]) 05/28/2013  . Cocaine abuse with cocaine-induced mood disorder ([place]) 05/28/2013  . Cannabis dependence with cannabis-induced anxiety disorder ([place]) 05/28/2013  . Morbid obesity with BMI of 45.0-49.9, adult ([place]) 04/25/2012  . Chest pain 04/17/2012  . Hypertension 04/17/2012  . DM (diabetes mellitus) with peripheral vascular complication ([place]) [id number]  . Hyperlipidemia 04/17/2012  . Tobacco use 04/17/2012    Past Surgical History:  Procedure Laterality Date  . ABDOMINAL AORTOGRAM W/LOWER EXTREMITY Left 05/27/2018   Procedure: ABDOMINAL  AORTOGRAM W/LOWER EXTREMITY Runoff and Possible Intervention;  Surgeon: Waynetta Sandy, MD;  Location: [place] CV LAB;  Service: Cardiovascular;  Laterality: Left;  . APPLICATION OF WOUND VAC Left 06/06/2018   Procedure: APPLICATION OF WOUND VAC;  Surgeon: Waynetta Sandy, MD;  Location: [place];  Service: Vascular;  Laterality: Left;  . BIOPSY  04/04/2018    Procedure: BIOPSY;  Surgeon: [name], MD;  Location: [facility] ENDOSCOPY;  Service: Endoscopy;;  . CARDIAC CATHETERIZATION  ~ 2011  . COLONOSCOPY N/A 04/04/2018   Procedure: COLONOSCOPY;  Surgeon: [name], MD;  Location: [facility] ENDOSCOPY;  Service: Endoscopy;  Laterality: N/A;  . LOWER EXTREMITY ANGIOGRAM Left 05/27/2018  . PERIPHERAL VASCULAR INTERVENTION Left 05/27/2018   Procedure: PERIPHERAL VASCULAR INTERVENTION;  Surgeon: Waynetta Sandy, MD;  Location: [place] CV LAB;  Service: Cardiovascular;  Laterality: Left;  . POLYPECTOMY  04/04/2018   Procedure: POLYPECTOMY;  Surgeon: [name], MD;  Location: [facility] ENDOSCOPY;  Service: Endoscopy;;  . TRANSMETATARSAL AMPUTATION Left 05/28/2018   Procedure: AMPUTATION TOES THREE, FOUR AND FIVE on left foot;  Surgeon: Waynetta Sandy, MD;  Location: [place];  Service: Vascular;  Laterality: Left;  . WOUND DEBRIDEMENT Left 06/06/2018   Procedure: DEBRIDEMENT WOUND LEFT FOOT;  Surgeon: Waynetta Sandy, MD;  Location: [place];  Service: Vascular;  Laterality: Left;     OB History   No obstetric history on file.     Family History  Problem Relation Age of Onset  . Stroke Brother 12  . Stroke Brother 30  . Heart attack Mother 68  . Stroke Father 32    Social History   Tobacco Use  . Smoking status: Former Smoker    Packs/day: 0.25    Years: 28.00    Pack years: 7.00    Types: Cigarettes  . Smokeless tobacco: Never Used  Substance Use Topics  . Alcohol use: No    Alcohol/week: 0.0 standard drinks    Comment: rare  . Drug use: Yes    Types: Marijuana, "Crack" cocaine    Comment: marijuana last use days ago; last cocaine several months ago    Home Medications Prior to Admission medications   Medication Sig Start Date End Date Taking? Authorizing Provider  ARIPiprazole (ABILIFY) 15 MG tablet Take 1 tablet (15 mg total) by mouth at bedtime. 08/26/18   Medina-Vargas, Monina C, NP  aspirin EC 81 MG EC tablet  Take 1 tablet (81 mg total) by mouth daily. 09/21/18   Donzetta Starch, NP  atorvastatin (LIPITOR) 40 MG tablet Take 1 tablet (40 mg total) by mouth daily at 6 PM. 12/10/19   Patrecia Pour, MD  cloNIDine (CATAPRES) 0.3 MG tablet Take 0.3 mg by mouth 2 (two) times daily.    [provider]  clopidogrel (PLAVIX) 75 MG tablet Take 1 tablet (75 mg total) by mouth daily. 12/11/19   Patrecia Pour, MD  ferrous sulfate 325 (65 FE) MG tablet Take 1 tablet (325 mg total) by mouth daily with breakfast. 08/26/18   Medina-Vargas, Monina C, NP  hydrALAZINE (APRESOLINE) 100 MG tablet Take 100 mg by mouth 3 (three) times daily.    [provider]  Insulin Detemir (LEVEMIR) 100 UNIT/ML Pen Inject 15 Units into the skin 2 (two) times daily. 08/26/18   Medina-Vargas, Monina C, NP  lisinopril (ZESTRIL) 40 MG tablet Take 40 mg by mouth daily.    [provider]  pentoxifylline (TRENTAL) 400 MG CR tablet Take 1 tablet (  400 mg total) by mouth 3 (three) times daily with meals. 08/26/18   Medina-Vargas, Monina C, NP    Allergies    Morphine and related and Metformin  Review of Systems   Review of Systems  All other systems reviewed and are negative.   Physical Exam Updated Vital Signs BP (!) 148/83   Pulse 83   Temp 98.8 F (37.1 C) (Oral)   Resp 19   Ht 1.549 m (5\' 1" )   Wt 80.6 kg   SpO2 100%   BMI 33.57 kg/m   Physical Exam Vitals and nursing note reviewed.  Constitutional:      General: She is not in acute distress.    Appearance: Normal appearance. She is well-developed. She is not toxic-appearing.  HENT:     Head: Normocephalic and atraumatic.  Eyes:     General: Lids are normal.     Conjunctiva/sclera: Conjunctivae normal.     Pupils: Pupils are equal, round, and reactive to light.  Neck:     Thyroid: No thyroid mass.     Trachea: No tracheal deviation.  Cardiovascular:     Rate and Rhythm: Normal rate and regular rhythm.     Heart sounds: Normal heart sounds. No  murmur. No gallop.   Pulmonary:     Effort: Pulmonary effort is normal. No respiratory distress.     Breath sounds: Normal breath sounds. No stridor. No decreased breath sounds, wheezing, rhonchi or rales.  Abdominal:     General: Bowel sounds are normal. There is no distension.     Palpations: Abdomen is soft.     Tenderness: There is no abdominal tenderness. There is no rebound.  Musculoskeletal:        General: No tenderness. Normal range of motion.     Cervical back: Normal range of motion and neck supple.  Skin:    General: Skin is warm and dry.     Findings: No abrasion or rash.  Neurological:     Mental Status: She is alert and oriented to person, place, and time. Mental status is at baseline.     GCS: GCS eye subscore is 4. GCS verbal subscore is 5. GCS motor subscore is 6.     Cranial Nerves: No cranial nerve deficit.     Sensory: No sensory deficit.     Comments: Patient has baseline expressive aphasia and left-sided weakness which she states is unchanged  Psychiatric:        Attention and Perception: Attention normal.     ED Results / Procedures / Treatments   Labs (all labs ordered are listed, but only abnormal results are displayed) Labs Reviewed  BASIC METABOLIC PANEL  CBC  TROPONIN I (HIGH SENSITIVITY)    EKG None  ED ECG REPORT   Date: 12/15/2019  Rate: 93  Rhythm: normal sinus rhythm  QRS Axis: normal  Intervals: normal  ST/T Wave abnormalities: normal  Conduction Disutrbances:none  Narrative Interpretation:   Old EKG Reviewed: none available  I have personally reviewed the EKG tracing and agree with the computerized printout as noted.  Radiology No results found.  Procedures Procedures (including critical care time)  Medications Ordered in ED Medications - No data to display  ED Course  I have reviewed the triage vital signs and the nursing notes.  Pertinent labs & imaging results that were available during my care of the patient were  reviewed by me and considered in my medical decision making (see chart for details).  MDM Rules/Calculators/A&P                      Patient is EKG without ischemic changes.  For troponin here negative.  Patient has multiple risk factors and will admit for chest pain rule out Final Clinical Impression(s) / ED Diagnoses Final diagnoses:  None    Rx / DC Orders ED Discharge Orders    None       Lacretia Leigh, MD 12/15/19 2206

## 2019-12-15 NOTE — H&P (Signed)
History and Physical   Diamond Rice [id number] DOB: Apr 27, 1971 DOA: 12/15/2019  Referring MD/NP/PA: Dr Zenia Resides  PCP: [name], MD   Outpatient Specialists: Dr. Percival Spanish, cardiology  Patient coming from: Skilled nursing facility at [facility]  Chief Complaint: Chest pain  HPI: Diamond Rice is a 49 y.o. female with medical history significant of recent CVA patient was discharged on the 14th of this month to [facility] rehab, polysubstance abuse, COPD, peripheral vascular disease, morbid obesity, type 2 diabetes, hypertension, schizoaffective disorder, hyperlipidemia, migraine headaches and sleep apnea who was brought in from the facility with substernal chest pain.  Pain has been going on and off for the last day.  Rated as 6 out of 10.  Radiating to her jaw and arm.  It has been relieved in the ER with some nitroglycerin and morphine.  Patient has significant risk factors for coronary artery disease as described above.  She is being admitted for MI ruled out.  She is currently on treatment including statin and aspirin from her CVA.  She has residual left hemiparesis..  ED Course: Temperature is 98 blood pressure 153/85 pulse 95 respirate 36 oxygen sat 99% room air.  CBC appears to be within normal creatinine 1.26 hemoglobin 11.6.  Initial troponin is negative.  Chest x-ray showed no acute findings.  EKG showed no new findings.  Patient being admitted for observation and MI ruled out.  Review of Systems: As per HPI otherwise 10 point review of systems negative.    Past Medical History:  Diagnosis Date  . Anxiety   . Chronic lower back pain 04/17/2012   "just got over some; catched when I walked"  . COPD (chronic obstructive pulmonary disease) ([place])   . Critical lower limb ischemia 05/27/2018  . Depression   . Headache(784.0) 04/17/2012   "~ qod; lately waking up in am w/one"  . High cholesterol   . Hypertension   . Migraines 04/17/2012  . Obesity   . Sleep apnea     CPAP  . Stroke ([place])   . Type II diabetes mellitus ([place]) 08/28/2002    Past Surgical History:  Procedure Laterality Date  . ABDOMINAL AORTOGRAM W/LOWER EXTREMITY Left 05/27/2018   Procedure: ABDOMINAL AORTOGRAM W/LOWER EXTREMITY Runoff and Possible Intervention;  Surgeon: Waynetta Sandy, MD;  Location: [place] [facility];  Service: Cardiovascular;  Laterality: Left;  . APPLICATION OF WOUND VAC Left 06/06/2018   Procedure: APPLICATION OF WOUND VAC;  Surgeon: Waynetta Sandy, MD;  Location: [place];  Service: Vascular;  Laterality: Left;  . BIOPSY  04/04/2018   Procedure: BIOPSY;  Surgeon: [name], MD;  Location: [facility] ENDOSCOPY;  Service: Endoscopy;;  . CARDIAC CATHETERIZATION  ~ 2011  . COLONOSCOPY N/A 04/04/2018   Procedure: COLONOSCOPY;  Surgeon: [name], MD;  Location: [facility] ENDOSCOPY;  Service: Endoscopy;  Laterality: N/A;  . LOWER EXTREMITY ANGIOGRAM Left 05/27/2018  . PERIPHERAL VASCULAR INTERVENTION Left 05/27/2018   Procedure: PERIPHERAL VASCULAR INTERVENTION;  Surgeon: Waynetta Sandy, MD;  Location: [place] [facility];  Service: Cardiovascular;  Laterality: Left;  . POLYPECTOMY  04/04/2018   Procedure: POLYPECTOMY;  Surgeon: [name], MD;  Location: [facility] ENDOSCOPY;  Service: Endoscopy;;  . TRANSMETATARSAL AMPUTATION Left 05/28/2018   Procedure: AMPUTATION TOES THREE, FOUR AND FIVE on left foot;  Surgeon: Waynetta Sandy, MD;  Location: [place];  Service: Vascular;  Laterality: Left;  . WOUND DEBRIDEMENT Left 06/06/2018   Procedure: DEBRIDEMENT WOUND LEFT FOOT;  Surgeon: Waynetta Sandy,  MD;  Location: [facility] OR;  Service: Vascular;  Laterality: Left;     reports that she has quit smoking. Her smoking use included cigarettes. She has a 7.00 pack-year smoking history. She has never used smokeless tobacco. She reports current drug use. Drugs: Marijuana and "Crack" cocaine. She reports that she does not drink alcohol.  Allergies  Allergen  Reactions  . Morphine And Related Hives  . Metformin Diarrhea and Other (See Comments)    "Allergic," per [facility]    Family History  Problem Relation Age of Onset  . Stroke Brother 33  . Stroke Brother 52  . Heart attack Mother 36  . Stroke Father 53     Prior to Admission medications   Medication Sig Start Date End Date Taking? Authorizing Provider  amLODipine (NORVASC) 10 MG tablet Take 10 mg by mouth daily.   Yes [provider]  ARIPiprazole (ABILIFY) 15 MG tablet Take 1 tablet (15 mg total) by mouth at bedtime. 08/26/18  Yes Medina-Vargas, Monina C, NP  aspirin EC 81 MG EC tablet Take 1 tablet (81 mg total) by mouth daily. 09/21/18  Yes Donzetta Starch, NP  atorvastatin (LIPITOR) 40 MG tablet Take 1 tablet (40 mg total) by mouth daily at 6 PM. 12/10/19  Yes Patrecia Pour, MD  bisacodyl (DULCOLAX) 10 MG suppository Place 10 mg rectally daily as needed for mild constipation or moderate constipation (if no relief from Milk of Magnesia).   Yes [provider]  cilostazol (PLETAL) 100 MG tablet Take 100 mg by mouth 2 (two) times daily.   Yes [provider]  cloNIDine (CATAPRES) 0.3 MG tablet Take 0.3 mg by mouth 2 (two) times daily.   Yes [provider]  clopidogrel (PLAVIX) 75 MG tablet Take 1 tablet (75 mg total) by mouth daily. 12/11/19  Yes Patrecia Pour, MD  ferrous sulfate 325 (65 FE) MG tablet Take 1 tablet (325 mg total) by mouth daily with breakfast. 08/26/18  Yes Medina-Vargas, Monina C, NP  hydrALAZINE (APRESOLINE) 100 MG tablet Take 100 mg by mouth 3 (three) times daily.   Yes [provider]  Insulin Detemir (LEVEMIR) 100 UNIT/ML Pen Inject 15 Units into the skin 2 (two) times daily. 08/26/18  Yes Medina-Vargas, Monina C, NP  lisinopril (ZESTRIL) 40 MG tablet Take 40 mg by mouth daily.   Yes [provider]  magnesium hydroxide (MILK OF MAGNESIA) 400 MG/5ML suspension Take 30 mLs by mouth daily as needed for mild constipation.    Yes [provider]  PRESCRIPTION MEDICATION CPAP- at bedtime   Yes [provider]  Sodium Phosphates (RA SALINE ENEMA) 19-7 GM/118ML ENEM Place 1 enema rectally daily as needed (if no relief from Dulcolax suppository for constipation and call MD if no relief from enema).   Yes [provider]  pentoxifylline (TRENTAL) 400 MG CR tablet Take 1 tablet (400 mg total) by mouth 3 (three) times daily with meals. Patient not taking: Reported on 12/15/2019 08/26/18   Nickola Major, NP    Physical Exam: Vitals:   12/15/19 2041 12/15/19 2042 12/15/19 2130 12/15/19 2215  BP:  (!) 148/83 (!) 152/83 (!) 153/85  Pulse: 83  92 88  Resp: 19  (!) 31 (!) 27  Temp:  98.8 F (37.1 C)    TempSrc:  Oral    SpO2: 100%  100% 99%  Weight:      Height:          Constitutional: Anxious, morbidly obese,  no acute distress Vitals:   12/15/19 2041 12/15/19 2042 12/15/19 2130 12/15/19 2215  BP:  (!) 148/83 (!) 152/83 (!) 153/85  Pulse: 83  92 88  Resp: 19  (!) 31 (!) 27  Temp:  98.8 F (37.1 C)    TempSrc:  Oral    SpO2: 100%  100% 99%  Weight:      Height:       Eyes: PERRL, lids and conjunctivae normal ENMT: Mucous membranes are moist. Posterior pharynx clear of any exudate or lesions.Normal dentition.  Neck: normal, supple, no masses, no thyromegaly Respiratory: clear to auscultation bilaterally, no wheezing, no crackles. Normal respiratory effort. No accessory muscle use.  Cardiovascular: Sinus tachycardia, no murmurs / rubs / gallops. No extremity edema. 2+ pedal pulses. No carotid bruits.  Abdomen: no tenderness, no masses palpated. No hepatosplenomegaly. Bowel sounds positive.  Musculoskeletal: no clubbing / cyanosis. No joint deformity upper and lower extremities. Good ROM, no contractures. Normal muscle tone.  Skin: no rashes, lesions, ulcers. No induration Neurologic: CN 2-12 grossly intact. Sensation intact, DTR normal.  Residual left  hemiparesis Psychiatric: Normal judgment and insight. Alert and oriented x 3. Normal mood.     Labs on Admission: I have personally reviewed following labs and imaging studies  CBC: Recent Labs  Lab 12/09/19 0332 12/10/19 0500 12/15/19 2043  WBC 7.0 7.3 5.4  NEUTROABS 3.1 2.6  --   HGB 11.7* 11.8* 11.6*  HCT 37.4 37.6 37.3  MCV 73.0* 74.3* 73.9*  PLT 263 251 [id number]   Basic Metabolic Panel: Recent Labs  Lab 12/09/19 0332 12/10/19 0500 12/15/19 2043  NA 141 142 140  K 3.8 3.7 3.5  CL 110 110 107  CO2 21* 23 22  GLUCOSE 100* 85 132*  BUN 12 12 15   CREATININE 1.36* 1.48* 1.26*  CALCIUM 9.0 9.3 9.1  MG 2.1 2.2  --    GFR: Estimated Creatinine Clearance: 53.1 mL/min (A) (by C-G formula based on SCr of 1.26 mg/dL (H)). Liver Function Tests: No results for input(s): AST, ALT, ALKPHOS, BILITOT, PROT, ALBUMIN in the last 168 hours. No results for input(s): LIPASE, AMYLASE in the last 168 hours. No results for input(s): AMMONIA in the last 168 hours. Coagulation Profile: No results for input(s): INR, PROTIME in the last 168 hours. Cardiac Enzymes: No results for input(s): CKTOTAL, CKMB, CKMBINDEX, TROPONINI in the last 168 hours. BNP (last 3 results) No results for input(s): PROBNP in the last 8760 hours. HbA1C: No results for input(s): HGBA1C in the last 72 hours. CBG: Recent Labs  Lab 12/09/19 1123 12/09/19 1555 12/09/19 2115 12/10/19 0601 12/10/19 1133  GLUCAP 83 101* 104* 98 149*   Lipid Profile: No results for input(s): CHOL, HDL, LDLCALC, TRIG, CHOLHDL, LDLDIRECT in the last 72 hours. Thyroid Function Tests: No results for input(s): TSH, T4TOTAL, FREET4, T3FREE, THYROIDAB in the last 72 hours. Anemia Panel: No results for input(s): VITAMINB12, FOLATE, FERRITIN, TIBC, IRON, RETICCTPCT in the last 72 hours. Urine analysis:    Component Value Date/Time   COLORURINE YELLOW 12/07/2019 1450   APPEARANCEUR HAZY (A) 12/07/2019 1450   LABSPEC 1.023 12/07/2019  1450   PHURINE 5.0 12/07/2019 1450   GLUCOSEU 50 (A) 12/07/2019 1450   HGBUR NEGATIVE 12/07/2019 1450   BILIRUBINUR NEGATIVE 12/07/2019 1450   KETONESUR 5 (A) 12/07/2019 1450   PROTEINUR 30 (A) 12/07/2019 1450   UROBILINOGEN 0.2 08/28/2014 1143   NITRITE NEGATIVE 12/07/2019 1450   LEUKOCYTESUR NEGATIVE 12/07/2019 1450   Sepsis Labs: @LABRCNTIP (procalcitonin:4,lacticidven:4) )  Recent Results (from the past 240 hour(s))  SARS CORONAVIRUS 2 (TAT 6-24 HRS) Nasopharyngeal Nasopharyngeal Swab     Status: None   Collection Time: 12/06/19  7:15 PM   Specimen: Nasopharyngeal Swab  Result Value Ref Range Status   SARS Coronavirus 2 NEGATIVE NEGATIVE Final    Comment: (NOTE) SARS-CoV-2 target nucleic acids are NOT DETECTED. The SARS-CoV-2 RNA is generally detectable in upper and lower respiratory specimens during the acute phase of infection. Negative results do not preclude SARS-CoV-2 infection, do not rule out co-infections with other pathogens, and should not be used as the sole basis for treatment or other patient management decisions. Negative results must be combined with clinical observations, patient history, and epidemiological information. The expected result is Negative. Fact Sheet for Patients: SugarRoll.be Fact Sheet for Healthcare Providers: https://www.woods-mathews.com/ This test is not yet approved or cleared by the [place] FDA and  has been authorized for detection and/or diagnosis of SARS-CoV-2 by FDA under an Emergency Use Authorization (EUA). This EUA will remain  in effect (meaning this test can be used) for the duration of the COVID-19 declaration under Section 56 4(b)(1) of the Act, 21 U.S.C. section 360bbb-3(b)(1), unless the authorization is terminated or revoked sooner. Performed at [facility], [place] [address] [place], [place] [address]   SARS CORONAVIRUS 2 (TAT 6-24 HRS) Nasopharyngeal Nasopharyngeal  Swab     Status: None   Collection Time: 12/09/19  2:34 PM   Specimen: Nasopharyngeal Swab  Result Value Ref Range Status   SARS Coronavirus 2 NEGATIVE NEGATIVE Final    Comment: (NOTE) SARS-CoV-2 target nucleic acids are NOT DETECTED. The SARS-CoV-2 RNA is generally detectable in upper and lower respiratory specimens during the acute phase of infection. Negative results do not preclude SARS-CoV-2 infection, do not rule out co-infections with other pathogens, and should not be used as the sole basis for treatment or other patient management decisions. Negative results must be combined with clinical observations, patient history, and epidemiological information. The expected result is Negative. Fact Sheet for Patients: SugarRoll.be Fact Sheet for Healthcare Providers: https://www.woods-mathews.com/ This test is not yet approved or cleared by the [place] FDA and  has been authorized for detection and/or diagnosis of SARS-CoV-2 by FDA under an Emergency Use Authorization (EUA). This EUA will remain  in effect (meaning this test can be used) for the duration of the COVID-19 declaration under Section 56 4(b)(1) of the Act, 21 U.S.C. section 360bbb-3(b)(1), unless the authorization is terminated or revoked sooner. Performed at [facility], [place] [address] [place], [place] [address]      Radiological Exams on Admission: DG Chest Port 1 View  Result Date: 12/15/2019 CLINICAL DATA:  49 year old female with cough. EXAM: PORTABLE CHEST 1 VIEW COMPARISON:  Chest radiograph dated 09/12/2018. FINDINGS: Stable cardiomegaly. There are bibasilar atelectatic changes. No focal consolidation, pleural effusion, pneumothorax. Atherosclerotic calcification of the aorta. No acute osseous pathology. IMPRESSION: No active disease. Electronically Signed   By: Anner Crete M.D.   On: 12/15/2019 21:08    EKG: Independently reviewed.  Shows sinus rhythm  with a rate of 93.  Mildly prolonged PR interval.  Nonspecific ST changes in the anterior and inferior leads.  Assessment/Plan Active Problems:   Chest pain   DM (diabetes mellitus) with peripheral vascular complication (HCC)   Hyperlipidemia   Psychoactive substance-induced organic mood disorder (HCC)   Left hemiparesis (HCC)   Schizoaffective disorder, bipolar type (HCC)   Class 2 severe obesity due to  excess calories with serious comorbidity and body mass index (BMI) of 35.0 to 35.9 in adult [facility])   Type 2 diabetes mellitus ([place])   Chronic obstructive pulmonary disease ([place])     #1 chest pain: Patient has significant risk factors for coronary artery disease.  We will admit and check serial enzymes x3.  If negative most likely patient has ruled out.  She has had recent echo which was normal.  No repeat.  Empiric nitroglycerin.  Lovenox and continue with aspirin and statin.  #2 peripheral vascular disease: On multiple medications including Pletal.  We will continue medications per home regimen.  #3 recent CVA: Patient will return to rehabilitation at the skilled facility.  #4 schizoaffective disorder: Continue home regimen.  #5 diabetes: Continue with sliding scale insulin.  Also home regimen.  #6 history of COPD: No evidence of exacerbation.  #7 morbid obesity: Continue dietary counseling.   DVT prophylaxis: Lovenox Code Status: Full code Family Communication: No family at bedside Disposition Plan: Back to skilled facility Consults called: None Admission status: Observation  Severity of Illness: The appropriate patient status for this patient is OBSERVATION. Observation status is judged to be reasonable and necessary in order to provide the required intensity of service to ensure the patient's safety. The patient's presenting symptoms, physical exam findings, and initial radiographic and laboratory data in the context of their medical condition is felt to place them at  decreased risk for further clinical deterioration. Furthermore, it is anticipated that the patient will be medically stable for discharge from the hospital within 2 midnights of admission. The following factors support the patient status of observation.   " The patient's presenting symptoms include chest pain. " The physical exam findings include morbidly obese but no acute findings. " The initial radiographic and laboratory data are normal enzymes.     Barbette Merino MD [facility] Pager [phone]  If 7PM-7AM, please contact night-coverage [email or web address] Password [facility]  12/15/2019, 10:53 PM

## 2019-12-16 DIAGNOSIS — G8194 Hemiplegia, unspecified affecting left nondominant side: Secondary | ICD-10-CM

## 2019-12-16 DIAGNOSIS — R079 Chest pain, unspecified: Secondary | ICD-10-CM | POA: Diagnosis not present

## 2019-12-16 DIAGNOSIS — J449 Chronic obstructive pulmonary disease, unspecified: Secondary | ICD-10-CM | POA: Diagnosis not present

## 2019-12-16 DIAGNOSIS — F250 Schizoaffective disorder, bipolar type: Secondary | ICD-10-CM

## 2019-12-16 DIAGNOSIS — R0789 Other chest pain: Secondary | ICD-10-CM | POA: Diagnosis not present

## 2019-12-16 DIAGNOSIS — E1151 Type 2 diabetes mellitus with diabetic peripheral angiopathy without gangrene: Secondary | ICD-10-CM | POA: Diagnosis not present

## 2019-12-16 LAB — CBG MONITORING, ED
Glucose-Capillary: 62 mg/dL — ABNORMAL LOW (ref 70–99)
Glucose-Capillary: 120 mg/dL — ABNORMAL HIGH (ref 70–99)

## 2019-12-16 LAB — TROPONIN I (HIGH SENSITIVITY): Troponin I (High Sensitivity): <2 ng/L (ref <18)

## 2019-12-16 LAB — SARS CORONAVIRUS 2 (TAT 6-24 HRS): SARS Coronavirus 2: NEGATIVE

## 2019-12-16 NOTE — ED Notes (Signed)
PTAR called @ 1309-per Will, RN called by Levada Dy

## 2019-12-16 NOTE — Discharge Summary (Signed)
Discharge Summary  Diamond Rice [id number] DOB: 12/19/1970  PCP: [name], MD  Admit date: 12/15/2019 Discharge date: 12/16/2019  Time spent: 30 mins  Recommendations for Outpatient Follow-up:  1. Follow up with PCP in 1 week  Discharge Diagnoses:  Active Hospital Problems   Diagnosis Date Noted  . Type 2 diabetes mellitus ([place])   . Chronic obstructive pulmonary disease ([place])   . Class 2 severe obesity due to excess calories with serious comorbidity and body mass index (BMI) of 35.0 to 35.9 in adult [facility])   . Schizoaffective disorder, bipolar type ([place]) 10/07/2015  . Left hemiparesis ([place]) 09/15/2014  . Psychoactive substance-induced organic mood disorder ([place]) 05/28/2013  . DM (diabetes mellitus) with peripheral vascular complication ([place]) [id number]  . Hyperlipidemia 04/17/2012  . Chest pain 04/17/2012    Resolved Hospital Problems  No resolved problems to display.    Discharge Condition: Stable  Diet recommendation: Heart healthy  Vitals:   12/16/19 1130 12/16/19 1200  BP: (!) 148/77 134/71  Pulse: 87 82  Resp: 16 17  Temp:  98.6 F (37 C)  SpO2: 100% 99%    History of present illness:  Diamond Rice is a 49 y.o. female with medical history significant of recent CVA patient was discharged on the 14th of this month to [place] rehab, polysubstance abuse, COPD, peripheral vascular disease, morbid obesity, type 2 diabetes, hypertension, schizoaffective disorder, hyperlipidemia, migraine headaches and sleep apnea who was brought in from the facility with substernal chest pain.  Pain has been going on and off for the last day.  Rated as 6 out of 10.  Radiating to her jaw and arm.  It has been relieved in the ER with some nitroglycerin and morphine.  Patient has significant risk factors for coronary artery disease as described above.  She is being admitted for MI ruled out.  She is currently on treatment including statin and aspirin from her CVA.  She  has residual left hemiparesis. EMS was called, BP noted to be 240/120, was given NTG with improvement, sats 99% on RA. Labs notable for creatinine 1.26 hemoglobin 11.6. Troponin X 2 is negative.  Chest x-ray showed no acute findings.  EKG showed no new acute ST changes. Patient was observed in the ED on tele, no events. Today, BP much improved, patient denies any new complaints, no further chest pain, denies SOB, abdominal pain, nausea/vomiting, fever/chills, diaphoresis.  Patient will be discharged back to [facility] rehab, to continue rehab.   Hospital Course:  Active Problems:   Chest pain   DM (diabetes mellitus) with peripheral vascular complication (HCC)   Hyperlipidemia   Psychoactive substance-induced organic mood disorder (HCC)   Left hemiparesis (HCC)   Schizoaffective disorder, bipolar type (HCC)   Class 2 severe obesity due to excess calories with serious comorbidity and body mass index (BMI) of 35.0 to 35.9 in adult [facility])   Type 2 diabetes mellitus (HCC)   Chronic obstructive pulmonary disease ([place])    Atypical chest pain ?  May be related to hypertensive crisis Troponin x2 -, EKG with no acute ST changes Chest x-ray unremarkable Continue home aspirin, statin Follow-up with cardiology as an outpatient  Hypertensive crisis BP poorly controlled, was recently started on 10 mg of amlodipine on 12/12/2018 Continue hydralazine, clonidine, lisinopril, amlodipine, strict BP control recommended Ensure patient is being compliant with these medications  Recent CVA Stable residual left-sided hemiparesis Continue aspirin, statin, clopidogrel Continue rehab  Peripheral vascular disease Continue cilostazol  Diabetes mellitus  type 2 Continue home regimen  History of COPD Stable  Obesity Lifestyle modification advised  Schizoaffective disorder Continue home regimen       Malnutrition Type:      Malnutrition Characteristics:      Nutrition Interventions:        Estimated body mass index is 33.57 kg/m as calculated from the following:   Height as of this encounter: 5\' 1"  (1.549 m).   Weight as of this encounter: 80.6 kg.    Procedures:  None  Consultations:  None  Discharge Exam: BP 134/71   Pulse 82   Temp 98.6 F (37 C) (Oral)   Resp 17   Ht 5\' 1"  (1.549 m)   Wt 80.6 kg   SpO2 99%   BMI 33.57 kg/m   General: NAD Cardiovascular: S1, S2 present Respiratory: CTA B  Discharge Instructions You were cared for by a hospitalist during your hospital stay. If you have any questions about your discharge medications or the care you received while you were in the hospital after you are discharged, you can call the unit and asked to speak with the hospitalist on call if the hospitalist that took care of you is not available. Once you are discharged, your primary care physician will handle any further medical issues. Please note that NO REFILLS for any discharge medications will be authorized once you are discharged, as it is imperative that you return to your primary care physician (or establish a relationship with a primary care physician if you do not have one) for your aftercare needs so that they can reassess your need for medications and monitor your lab values.  Discharge Instructions    Diet - low sodium heart healthy   Complete by: As directed    Increase activity slowly   Complete by: As directed      Allergies as of 12/16/2019      Reactions   Morphine And Related Hives   Metformin Diarrhea, Other (See Comments)   "Allergic," per [facility]      Medication List    STOP taking these medications   pentoxifylline 400 MG CR tablet Commonly known as: TRENTAL     TAKE these medications   amLODipine 10 MG tablet Commonly known as: NORVASC Take 10 mg by mouth daily.   ARIPiprazole 15 MG tablet Commonly known as: ABILIFY Take 1 tablet (15 mg total) by mouth at bedtime.   aspirin 81 MG EC tablet Take 1 tablet (81 mg total) by  mouth daily.   atorvastatin 40 MG tablet Commonly known as: LIPITOR Take 1 tablet (40 mg total) by mouth daily at 6 PM.   bisacodyl 10 MG suppository Commonly known as: DULCOLAX Place 10 mg rectally daily as needed for mild constipation or moderate constipation (if no relief from Milk of Magnesia).   cilostazol 100 MG tablet Commonly known as: PLETAL Take 100 mg by mouth 2 (two) times daily.   cloNIDine 0.3 MG tablet Commonly known as: CATAPRES Take 0.3 mg by mouth 2 (two) times daily.   clopidogrel 75 MG tablet Commonly known as: PLAVIX Take 1 tablet (75 mg total) by mouth daily.   ferrous sulfate 325 (65 FE) MG tablet Take 1 tablet (325 mg total) by mouth daily with breakfast.   hydrALAZINE 100 MG tablet Commonly known as: APRESOLINE Take 100 mg by mouth 3 (three) times daily.   insulin detemir 100 UNIT/ML FlexPen Commonly known as: LEVEMIR Inject 15 Units into the skin 2 (two) times daily.  lisinopril 40 MG tablet Commonly known as: ZESTRIL Take 40 mg by mouth daily.   magnesium hydroxide 400 MG/5ML suspension Commonly known as: MILK OF MAGNESIA Take 30 mLs by mouth daily as needed for mild constipation.   PRESCRIPTION MEDICATION CPAP- at bedtime   RA Saline Enema 19-7 GM/118ML Enem Place 1 enema rectally daily as needed (if no relief from Dulcolax suppository for constipation and call MD if no relief from enema).      Allergies  Allergen Reactions  . Morphine And Related Hives  . Metformin Diarrhea and Other (See Comments)    "Allergic," per [facility]      The results of significant diagnostics from this hospitalization (including imaging, microbiology, ancillary and laboratory) are listed below for reference.    Significant Diagnostic Studies: MR ANGIO HEAD WO CONTRAST  Result Date: 12/06/2019 CLINICAL DATA:  49 year old female with acute on chronic small vessel disease. EXAM: MRA HEAD WITHOUT CONTRAST TECHNIQUE: Angiographic images of the [place] were obtained using MRA technique without intravenous contrast. COMPARISON:  Brain MRI today reported separately. Intracranial MRA 09/02/2018. FINDINGS: Chronically diminutive posterior circulation. Antegrade flow appears stable in diminutive distal vertebral arteries, the left terminates in PICA. The right PICA origin remains patent. The right vertebral supplies a patent but diminutive basilar artery. There is moderate to severe stenosis at the basilar artery origin which may be progressed from last year (series 1038, image 7). Fetal type bilateral PCA origins. The basilar terminates in the SCA is which remain patent. Bilateral fetal type PCA origins. Severe right PCA P1 and left PCA P2 stenoses are redemonstrated and stable. Stable antegrade flow in both ICA siphons. Normal posterior communicating artery origins. Mild to moderate bilateral siphon anterior genu/supraclinoid irregularity appears stable with mild stenosis. Carotid termini remain patent. MCA and ACA origins remain patent but there is new moderate stenosis at the left A1 origin (series 1030, image 10). MCA M1 segments and bifurcations remain patent with mild irregularity. Visible bilateral MCA and ACA branches are stable with mild irregularity. IMPRESSION: 1. Chronically age advanced intracranial atherosclerosis. Negative for large vessel occlusion. 2. Diminutive vertebrobasilar system on the basis of fetal type PCA origins, but evidence of hemodynamically significant stenosis at the Vertebrobasilar Junction which appears progressed from last year. Chronic severe stenoses of the Right P1 and Left P2. 3. Stable mild stenoses of the bilateral ICA anterior genu. New moderate stenosis of the Left ACA origin. Electronically Signed   By: Genevie Ann M.D.   On: 12/06/2019 19:17   MR BRAIN WO CONTRAST  Result Date: 12/06/2019 CLINICAL DATA:  Code stroke. 49 year old female with slurred speech. History of prior stroke. EXAM: MRI HEAD WITHOUT CONTRAST  TECHNIQUE: Multiplanar, multiecho pulse sequences of the brain and surrounding structures were obtained without intravenous contrast. COMPARISON:  Head CT earlier today. Brain MRI 01/14/2018. FINDINGS: Brain: Oval 10 mm focus of restricted diffusion in the left corona radiata tracking into the posterior left lentiform (series 5, image 73). Faint T2 and FLAIR hyperintensity. No hemorrhage or mass effect. No other restricted diffusion. Small chronic infarct in the deep left cerebellar nuclei is new since 2019. Extensive chronic small vessel infarcts scattered in the bilateral basal ganglia, thalami, right corona radiata. There is Wallerian degeneration in the right brainstem. Scattered chronic microhemorrhages, with abundant hemosiderin in the posterior right lentiform. Additional scattered and confluent bilateral white matter T2 and FLAIR hyperintensity. No definite cortical encephalomalacia. No midline shift, mass effect, evidence of mass lesion, ventriculomegaly, extra-axial collection or acute  intracranial hemorrhage. Cervicomedullary junction and pituitary are within normal limits. Vascular: Major intracranial vascular flow voids are stable since 2019. Skull and upper cervical spine: Negative visible cervical spine and bone marrow signal. Sinuses/Orbits: Negative orbits. Paranasal Visualized paranasal sinuses and mastoids are stable and well pneumatized. Other: Visible internal auditory structures appear normal. Scalp and face soft tissues appear negative. IMPRESSION: 1. Small acute lacunar infarct in the left corona radiata tracking into the posterior left lentiform. No associated hemorrhage or mass effect. 2. Underlying very severe chronic small vessel disease. 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 Electronically Signed   By: Genevie Ann M.D.   On: 12/06/2019 19:11   DG Chest Port 1 View  Result Date: 12/15/2019 CLINICAL DATA:  49 year old female with cough. EXAM: PORTABLE CHEST 1 VIEW  COMPARISON:  Chest radiograph dated 09/12/2018. FINDINGS: Stable cardiomegaly. There are bibasilar atelectatic changes. No focal consolidation, pleural effusion, pneumothorax. Atherosclerotic calcification of the aorta. No acute osseous pathology. IMPRESSION: No active disease. Electronically Signed   By: Anner Crete M.D.   On: 12/15/2019 21:08   DG Swallowing Func-Speech Pathology  Result Date: 12/08/2019 Objective Swallowing Evaluation: Type of Study: MBS-Modified Barium Swallow Study  Patient Details Name: Jena Wineberg MRN: [id number] Date of Birth: 26-Dec-1970 Today's Date: 12/08/2019 Time: SLP Start Time (ACUTE ONLY): 0805 -SLP Stop Time (ACUTE ONLY): 0826 SLP Time Calculation (min) (ACUTE ONLY): 21 min Past Medical History: Past Medical History: Diagnosis Date . Anxiety  . Chronic lower back pain 04/17/2012  "just got over some; catched when I walked" . COPD (chronic obstructive pulmonary disease) ([place])  . Critical lower limb ischemia 05/27/2018 . Depression  . Headache(784.0) 04/17/2012  "~ qod; lately waking up in am w/one" . High cholesterol  . Hypertension  . Migraines 04/17/2012 . Obesity  . Sleep apnea   CPAP . Stroke ([place])  . Type II diabetes mellitus ([place]) 08/28/2002 Past Surgical History: Past Surgical History: Procedure Laterality Date . ABDOMINAL AORTOGRAM W/LOWER EXTREMITY Left 05/27/2018  Procedure: ABDOMINAL AORTOGRAM W/LOWER EXTREMITY Runoff and Possible Intervention;  Surgeon: Waynetta Sandy, MD;  Location: [facility];  Service: Cardiovascular;  Laterality: Left; . APPLICATION OF WOUND VAC Left 0000000  Procedure: APPLICATION OF WOUND VAC;  Surgeon: Waynetta Sandy, MD;  Location: [facility];  Service: Vascular;  Laterality: Left; . BIOPSY  04/04/2018  Procedure: BIOPSY;  Surgeon: [name], MD;  Location: [facility] ENDOSCOPY;  Service: Endoscopy;; . CARDIAC CATHETERIZATION  ~ 2011 . COLONOSCOPY N/A 04/04/2018  Procedure: COLONOSCOPY;  Surgeon: [name], MD;   Location: [facility] ENDOSCOPY;  Service: Endoscopy;  Laterality: N/A; . LOWER EXTREMITY ANGIOGRAM Left 05/27/2018 . PERIPHERAL VASCULAR INTERVENTION Left 05/27/2018  Procedure: PERIPHERAL VASCULAR INTERVENTION;  Surgeon: Waynetta Sandy, MD;  Location: [place] [facility];  Service: Cardiovascular;  Laterality: Left; . POLYPECTOMY  04/04/2018  Procedure: POLYPECTOMY;  Surgeon: [name], MD;  Location: [facility] ENDOSCOPY;  Service: Endoscopy;; . TRANSMETATARSAL AMPUTATION Left 05/28/2018  Procedure: AMPUTATION TOES THREE, FOUR AND FIVE on left foot;  Surgeon: Waynetta Sandy, MD;  Location: [facility];  Service: Vascular;  Laterality: Left; . WOUND DEBRIDEMENT Left 06/06/2018  Procedure: DEBRIDEMENT WOUND LEFT FOOT;  Surgeon: Waynetta Sandy, MD;  Location: [facility] OR;  Service: Vascular;  Laterality: Left; HPI: 48 year old female with history of hemorrhagic stroke in 10/07/2018 with residual left sided weakness, COPD, hypertension, hyperlipidemia, diabetes mellitus type 2, OSA on CPAP, anxiety, depression, substance abuse (marijuana, crack cocaine) presented with slurred speech. MRI revealed acute small  lacunar infarct in left corona radiata. BSE 1/21020 rec'd Dys 3, thin. MBS 09/18/18 oropharyngeal swallow WFL. Espohageal retention mid esophagus and w/u rec'd  Subjective: alert, cooperative Assessment / Plan / Recommendation CHL IP CLINICAL IMPRESSIONS 12/08/2019 Clinical Impression  Patient presents with mild oropharyngeal dysphagia. Oral phase is remarkable for decreased bolus cohesion, premature spillage to the vallecula and pyriform sinuses and lingual residue. When trialing a regular solid, the pt stated she was going to swallow the bolus before attempting to masticate, requiring Min cues to continue to fully masticate prior to swallowing. Pharyngeal phase is remarkable for delayed swallow initiation at the pyriform sinuses and reduced laryngeal closure.  Patient's vallecular space was also noted to be  small, making it difficult for the bolus to collect there prior to the swallow. Liquids would either spill over into the larynx or to the pyriform sinsues. The small vallecular space and reduced laryngeal closure ultimately resulted in penetration/aspiration. Aspiration (PAS 7 or 8) occurred during the swallow with thin liquids. As the pt was challenged by taking large consecutive sips, she aspirated, spit out what was remaining in her oral cavity and was noted with suspected nasopharyngeal regurgitation (nasal secretions and what appeared to be some barium expelled from her nasal cavity). The fluoro was not on during suspected nasopharyngeal regurgitation, therefore cannot confirm. A chin tuck was trialed to attempt to increase the space in the vallecula, however, this was unsuccessful. Recommend Dys 2 diet, nectar thick liquids, meds crushed in puree, with full supervision. Ensure the pt is taking small, single sips at a time and is following general aspiration precautions.  SLP Visit Diagnosis Dysphagia, oropharyngeal phase (R13.12) Attention and concentration deficit following -- Frontal lobe and executive function deficit following -- Impact on safety and function Moderate aspiration risk   CHL IP TREATMENT RECOMMENDATION 12/08/2019 Treatment Recommendations Therapy as outlined in treatment plan below   Prognosis 12/08/2019 Prognosis for Safe Diet Advancement Good Barriers to Reach Goals -- Barriers/Prognosis Comment -- CHL IP DIET RECOMMENDATION 12/08/2019 SLP Diet Recommendations Dysphagia 2 (Fine chop) solids;Nectar thick liquid Liquid Administration via Cup;Straw Medication Administration Crushed with puree Compensations Minimize environmental distractions;Slow rate;Small sips/bites Postural Changes Remain semi-upright after after feeds/meals (Comment);Seated upright at 90 degrees   CHL IP OTHER RECOMMENDATIONS 12/08/2019 Recommended Consults -- Oral Care Recommendations Oral care BID Other Recommendations  Prohibited food (jello, ice cream, thin soups);Order thickener from pharmacy;Remove water pitcher   CHL IP FOLLOW UP RECOMMENDATIONS 12/08/2019 Follow up Recommendations Skilled Nursing facility   [facility] IP FREQUENCY AND DURATION 12/08/2019 Speech Therapy Frequency (ACUTE ONLY) min 2x/week Treatment Duration 2 weeks      CHL IP ORAL PHASE 12/08/2019 Oral Phase Impaired Oral - Pudding Teaspoon -- Oral - Pudding Cup -- Oral - Honey Teaspoon -- Oral - Honey Cup -- Oral - Nectar Teaspoon -- Oral - Nectar Cup Lingual/palatal residue Oral - Nectar Straw Lingual/palatal residue Oral - Thin Teaspoon -- Oral - Thin Cup Decreased bolus cohesion;Premature spillage Oral - Thin Straw Decreased bolus cohesion;Premature spillage Oral - Puree WFL Oral - Mech Soft -- Oral - Regular Delayed oral transit Oral - Multi-Consistency -- Oral - Pill -- Oral Phase - Comment --  CHL IP PHARYNGEAL PHASE 12/08/2019 Pharyngeal Phase Impaired Pharyngeal- Pudding Teaspoon -- Pharyngeal -- Pharyngeal- Pudding Cup -- Pharyngeal -- Pharyngeal- Honey Teaspoon -- Pharyngeal -- Pharyngeal- Honey Cup -- Pharyngeal -- Pharyngeal- Nectar Teaspoon -- Pharyngeal -- Pharyngeal- Nectar Cup Penetration/Aspiration during swallow;Reduced airway/laryngeal closure Pharyngeal Material enters airway, remains ABOVE vocal cords  then ejected out Pharyngeal- Nectar Straw Penetration/Aspiration during swallow;Reduced airway/laryngeal closure Pharyngeal Material enters airway, remains ABOVE vocal cords then ejected out Pharyngeal- Thin Teaspoon -- Pharyngeal -- Pharyngeal- Thin Cup Penetration/Aspiration during swallow;Trace aspiration;Delayed swallow initiation-pyriform sinuses;Compensatory strategies attempted (with notebox);Other (Comment);Reduced airway/laryngeal closure Pharyngeal Material enters airway, passes BELOW cords without attempt by patient to eject out (silent aspiration) Pharyngeal- Thin Straw Penetration/Aspiration during swallow;Trace aspiration;Delayed swallow  initiation-pyriform sinuses;Compensatory strategies attempted (with notebox);Other (Comment);Reduced airway/laryngeal closure Pharyngeal Material enters airway, passes BELOW cords without attempt by patient to eject out (silent aspiration) Pharyngeal- Puree WFL Pharyngeal -- Pharyngeal- Mechanical Soft -- Pharyngeal -- Pharyngeal- Regular WFL Pharyngeal -- Pharyngeal- Multi-consistency -- Pharyngeal -- Pharyngeal- Pill -- Pharyngeal -- Pharyngeal Comment --  CHL IP CERVICAL ESOPHAGEAL PHASE 12/08/2019 Cervical Esophageal Phase WFL Pudding Teaspoon -- Pudding Cup -- Honey Teaspoon -- Honey Cup -- Nectar Teaspoon -- Nectar Cup -- Nectar Straw -- Thin Teaspoon -- Thin Cup -- Thin Straw -- Puree -- Mechanical Soft -- Regular -- Multi-consistency -- Pill -- Cervical Esophageal Comment -- Osie Bond., M.A. CCC-SLP Acute Rehabilitation Services Pager [phone] Office [phone] 12/08/2019, 9:45 AM              ECHOCARDIOGRAM COMPLETE  Result Date: 12/07/2019    ECHOCARDIOGRAM REPORT   Patient Name:   Diamond Rice Date of Exam: 12/07/2019 Medical Rec #:  [id number]         Height:       61.0 in Accession #:    [id number]        Weight:       195.0 lb Date of Birth:  1971-04-04          BSA:          1.869 m Patient Age:    34 years          BP:           168/88 mmHg Patient Gender: F                 HR:           50 bpm. Exam Location:  Inpatient Procedure: 2D Echo Indications:    Stroke 434.91 / I163.9  History:        Patient has prior history of Echocardiogram examinations, most                 recent 06/24/2018. Signs/Symptoms:Dyspnea; Risk Factors:Tobacco                 abuse, Hypertension, Diabetes and Dyslipidemia. Cocaine abuse                 Acute renal failure                 Aortic valve insufficiency.  Sonographer:    Vikki Ports Turrentine Referring Phys: [id number] [place]  1. Left ventricular ejection fraction, by estimation, is 65 to 70%. The left ventricle has normal function. The  left ventricle has no regional wall motion abnormalities. There is moderate concentric left ventricular hypertrophy. Left ventricular diastolic parameters are consistent with Grade I diastolic dysfunction (impaired relaxation). Elevated left ventricular end-diastolic pressure.  2. Right ventricular systolic function is normal. The right ventricular size is normal. Tricuspid regurgitation signal is inadequate for assessing PA pressure.  3. The mitral valve is normal in structure. Trivial mitral valve regurgitation. No evidence of mitral stenosis.  4. The aortic valve is tricuspid. Aortic valve regurgitation is mild to mdoerate. Aortic valve mean  gradient measures 9.0 mmHg. Aortic valve peak gradient measures 16.4 mmHg. Aortic valve area, by VTI measures 1.53 cm. Very mild aortic stenosis.  5. The inferior vena cava is normal in size with greater than 50% respiratory variability, suggesting right atrial pressure of 3 mmHg.  6. Left atrial size was moderately dilated. FINDINGS  Left Ventricle: Left ventricular ejection fraction, by estimation, is 65 to 70%. The left ventricle has normal function. The left ventricle has no regional wall motion abnormalities. The left ventricular internal cavity size was normal in size. There is  moderate concentric left ventricular hypertrophy. Left ventricular diastolic parameters are consistent with Grade I diastolic dysfunction (impaired relaxation). Elevated left ventricular end-diastolic pressure. Right Ventricle: The right ventricular size is normal. No increase in right ventricular wall thickness. Right ventricular systolic function is normal. Tricuspid regurgitation signal is inadequate for assessing PA pressure. Left Atrium: Left atrial size was moderately dilated. Right Atrium: Right atrial size was normal in size. Pericardium: There is no evidence of pericardial effusion. Mitral Valve: The mitral valve is normal in structure. Normal mobility of the mitral valve leaflets.  Trivial mitral valve regurgitation. No evidence of mitral valve stenosis. Tricuspid Valve: The tricuspid valve is normal in structure. Tricuspid valve regurgitation is trivial. No evidence of tricuspid stenosis. Aortic Valve: The aortic valve is tricuspid. Aortic valve regurgitation is mild to moderate. Aortic regurgitation PHT measures 944 msec. There is moderate calcification of the aortic valve. Aortic valve mean gradient measures 9.0 mmHg. Aortic valve peak gradient measures 16.4 mmHg. Aortic valve area, by VTI measures 1.53 cm. Very mild aortic stenosis. Pulmonic Valve: The pulmonic valve was normal in structure. Pulmonic valve regurgitation is not visualized. No evidence of pulmonic stenosis. Aorta: The aortic root is normal in size and structure. Venous: The inferior vena cava is normal in size with greater than 50% respiratory variability, suggesting right atrial pressure of 3 mmHg. IAS/Shunts: No atrial level shunt detected by color flow Doppler.  LEFT VENTRICLE PLAX 2D LVIDd:         3.60 cm  Diastology LVIDs:         2.20 cm  LV e' lateral:   4.03 cm/s LV PW:         1.40 cm  LV E/e' lateral: 19.3 LV IVS:        1.40 cm  LV e' medial:    5.22 cm/s LVOT diam:     1.70 cm  LV E/e' medial:  14.9 LV SV:         64 LV SV Index:   34 LVOT Area:     2.27 cm  RIGHT VENTRICLE RV S prime:     15.30 cm/s TAPSE (M-mode): 2.7 cm LEFT ATRIUM             Index       RIGHT ATRIUM           Index LA diam:        4.50 cm 2.41 cm/m  RA Area:     19.90 cm LA Vol (A2C):   91.5 ml 48.97 ml/m RA Volume:   51.10 ml  27.35 ml/m LA Vol (A4C):   75.3 ml 40.30 ml/m LA Biplane Vol: 85.9 ml 45.97 ml/m  AORTIC VALVE AV Area (Vmax):    1.48 cm AV Area (Vmean):   1.46 cm AV Area (VTI):     1.53 cm AV Vmax:           202.50 cm/s AV Vmean:  141.500 cm/s AV VTI:            0.416 m AV Peak Grad:      16.4 mmHg AV Mean Grad:      9.0 mmHg LVOT Vmax:         132.00 cm/s LVOT Vmean:        91.000 cm/s LVOT VTI:          0.280  m LVOT/AV VTI ratio: 0.67 AI PHT:            944 msec  AORTA Ao Root diam: 2.80 cm MITRAL VALVE MV Area (PHT): 2.48 cm     SHUNTS MV Decel Time: 306 msec     Systemic VTI:  0.28 m MV E velocity: 77.70 cm/s   Systemic Diam: 1.70 cm MV A velocity: 114.00 cm/s MV E/A ratio:  0.68 Fransico Him MD Electronically signed by Fransico Him MD Signature Date/Time: 12/07/2019/2:07:10 PM    Final    CT HEAD CODE STROKE WO CONTRAST  Result Date: 12/06/2019 CLINICAL DATA:  Code stroke. 49 year old female with slurred speech. History of prior stroke. EXAM: CT HEAD WITHOUT CONTRAST TECHNIQUE: Contiguous axial images were obtained from the base of the skull through the vertex without intravenous contrast. COMPARISON:  Head CT 06/26/2019. Brain MRI 09/02/2018. FINDINGS: Brain: Advanced chronic cerebral white matter and deep gray nuclei hypodensity. No acute intracranial hemorrhage identified. No midline shift, mass effect, or evidence of intracranial mass lesion. No ventriculomegaly. Small chronic infarct in the left cerebellum. No cortically based acute infarct identified. Vascular: Calcified atherosclerosis at the skull base. No suspicious intracranial vascular hyperdensity. Skull: Stable hyperostosis. No acute osseous abnormality identified. Sinuses/Orbits: Visualized paranasal sinuses and mastoids are stable and well pneumatized. Other: Leftward gaze. Negative scalp soft tissues. ASPECTS [facility] Stroke Program Early CT Score) Total score (0-10 with 10 being normal): 10 IMPRESSION: 1. Stable CT appearance of advanced chronic small vessel disease since October. ASPECTS 10. 2. These results were communicated to Dr. Lorraine Lax at 5:37 pmon 4/10/2021by text page via the [facility] messaging system. Electronically Signed   By: Genevie Ann M.D.   On: 12/06/2019 17:38   VAS US CAROTID  Result Date: 12/08/2019 Carotid Arterial Duplex Study Indications:       CVA and Speech disturbance. Risk Factors:      Hypertension, hyperlipidemia, Diabetes,  prior CVA. Other Factors:     Substance abuse. Comparison Study:  No prior study on file Performing Technologist: Sharion Dove RVS  Examination Guidelines: A complete evaluation includes B-mode imaging, spectral Doppler, color Doppler, and power Doppler as needed of all accessible portions of each vessel. Bilateral testing is considered an integral part of a complete examination. Limited examinations for reoccurring indications may be performed as noted.  Right Carotid Findings: +----------+--------+--------+--------+------------------+------------------+           PSV cm/sEDV cm/sStenosisPlaque DescriptionComments           +----------+--------+--------+--------+------------------+------------------+ CCA Prox  1       0                                 intimal thickening +----------+--------+--------+--------+------------------+------------------+ CCA Distal1       0                                 intimal thickening +----------+--------+--------+--------+------------------+------------------+ ICA Prox  1  0               heterogenous      tortuous           +----------+--------+--------+--------+------------------+------------------+ ICA Distal1       0                                 tortuous           +----------+--------+--------+--------+------------------+------------------+ ECA       1       0                                                    +----------+--------+--------+--------+------------------+------------------+ +----------+--------+-------+--------+-------------------+           PSV cm/sEDV cmsDescribeArm Pressure (mmHG) +----------+--------+-------+--------+-------------------+ Subclavian1                                          +----------+--------+-------+--------+-------------------+ +---------+--------+-+--------+-+ VertebralPSV cm/s0EDV cm/s0 +---------+--------+-+--------+-+  Left Carotid Findings:  +----------+--------+--------+--------+------------------+------------------+           PSV cm/sEDV cm/sStenosisPlaque DescriptionComments           +----------+--------+--------+--------+------------------+------------------+ CCA Prox  1       0                                 intimal thickening +----------+--------+--------+--------+------------------+------------------+ CCA Distal1       0                                 intimal thickening +----------+--------+--------+--------+------------------+------------------+ ICA Prox  1       0                                                    +----------+--------+--------+--------+------------------+------------------+ ICA Distal1       0                                 tortuous           +----------+--------+--------+--------+------------------+------------------+ ECA       1       0                                                    +----------+--------+--------+--------+------------------+------------------+ +----------+--------+--------+--------+-------------------+           PSV cm/sEDV cm/sDescribeArm Pressure (mmHG) +----------+--------+--------+--------+-------------------+ [id number]                                           +----------+--------+--------+--------+-------------------+ +---------+--------+-+--------+-+ VertebralPSV cm/s0EDV cm/s0 +---------+--------+-+--------+-+   Summary: Right Carotid: The extracranial vessels were near-normal with only  minimal wall                thickening or plaque. Left Carotid: The extracranial vessels were near-normal with only minimal wall               thickening or plaque. Vertebrals:  Bilateral vertebral arteries demonstrate antegrade flow. Subclavians: Normal flow hemodynamics were seen in bilateral subclavian              arteries. *See table(s) above for measurements and observations.  Electronically signed by Antony Contras MD on 12/08/2019 at 1:55:53 PM.     Final     Microbiology: Recent Results (from the past 240 hour(s))  SARS CORONAVIRUS 2 (TAT 6-24 HRS) Nasopharyngeal Nasopharyngeal Swab     Status: None   Collection Time: 12/06/19  7:15 PM   Specimen: Nasopharyngeal Swab  Result Value Ref Range Status   SARS Coronavirus 2 NEGATIVE NEGATIVE Final    Comment: (NOTE) SARS-CoV-2 target nucleic acids are NOT DETECTED. The SARS-CoV-2 RNA is generally detectable in upper and lower respiratory specimens during the acute phase of infection. Negative results do not preclude SARS-CoV-2 infection, do not rule out co-infections with other pathogens, and should not be used as the sole basis for treatment or other patient management decisions. Negative results must be combined with clinical observations, patient history, and epidemiological information. The expected result is Negative. Fact Sheet for Patients: SugarRoll.be Fact Sheet for Healthcare Providers: https://www.woods-mathews.com/ This test is not yet approved or cleared by the [place] FDA and  has been authorized for detection and/or diagnosis of SARS-CoV-2 by FDA under an Emergency Use Authorization (EUA). This EUA will remain  in effect (meaning this test can be used) for the duration of the COVID-19 declaration under Section 56 4(b)(1) of the Act, 21 U.S.C. section 360bbb-3(b)(1), unless the authorization is terminated or revoked sooner. Performed at [facility], [place] [address], [place], [place] [address]   SARS CORONAVIRUS 2 (TAT 6-24 HRS) Nasopharyngeal Nasopharyngeal Swab     Status: None   Collection Time: 12/09/19  2:34 PM   Specimen: Nasopharyngeal Swab  Result Value Ref Range Status   SARS Coronavirus 2 NEGATIVE NEGATIVE Final    Comment: (NOTE) SARS-CoV-2 target nucleic acids are NOT DETECTED. The SARS-CoV-2 RNA is generally detectable in upper and lower respiratory specimens during the acute phase of infection.  Negative results do not preclude SARS-CoV-2 infection, do not rule out co-infections with other pathogens, and should not be used as the sole basis for treatment or other patient management decisions. Negative results must be combined with clinical observations, patient history, and epidemiological information. The expected result is Negative. Fact Sheet for Patients: SugarRoll.be Fact Sheet for Healthcare Providers: https://www.woods-mathews.com/ This test is not yet approved or cleared by the [place] FDA and  has been authorized for detection and/or diagnosis of SARS-CoV-2 by FDA under an Emergency Use Authorization (EUA). This EUA will remain  in effect (meaning this test can be used) for the duration of the COVID-19 declaration under Section 56 4(b)(1) of the Act, 21 U.S.C. section 360bbb-3(b)(1), unless the authorization is terminated or revoked sooner. Performed at [facility], [place] [address], [place], [place] [address]   SARS CORONAVIRUS 2 (TAT 6-24 HRS) Nasopharyngeal Nasopharyngeal Swab     Status: None   Collection Time: 12/15/19 10:19 PM   Specimen: Nasopharyngeal Swab  Result Value Ref Range Status   SARS Coronavirus 2 NEGATIVE NEGATIVE Final    Comment: (NOTE) SARS-CoV-2  target nucleic acids are NOT DETECTED. The SARS-CoV-2 RNA is generally detectable in upper and lower respiratory specimens during the acute phase of infection. Negative results do not preclude SARS-CoV-2 infection, do not rule out co-infections with other pathogens, and should not be used as the sole basis for treatment or other patient management decisions. Negative results must be combined with clinical observations, patient history, and epidemiological information. The expected result is Negative. Fact Sheet for Patients: SugarRoll.be Fact Sheet for Healthcare  Providers: https://www.woods-mathews.com/ This test is not yet approved or cleared by the [place] FDA and  has been authorized for detection and/or diagnosis of SARS-CoV-2 by FDA under an Emergency Use Authorization (EUA). This EUA will remain  in effect (meaning this test can be used) for the duration of the COVID-19 declaration under Section 56 4(b)(1) of the Act, 21 U.S.C. section 360bbb-3(b)(1), unless the authorization is terminated or revoked sooner. Performed at [facility], [place] [address], [place], [place] [address]      Labs: Basic Metabolic Panel: Recent Labs  Lab 12/10/19 0500 12/15/19 2043  NA 142 140  K 3.7 3.5  CL 110 107  CO2 23 22  GLUCOSE 85 132*  BUN 12 15  CREATININE 1.48* 1.26*  CALCIUM 9.3 9.1  MG 2.2  --    Liver Function Tests: No results for input(s): AST, ALT, ALKPHOS, BILITOT, PROT, ALBUMIN in the last 168 hours. No results for input(s): LIPASE, AMYLASE in the last 168 hours. No results for input(s): AMMONIA in the last 168 hours. CBC: Recent Labs  Lab 12/10/19 0500 12/15/19 2043  WBC 7.3 5.4  NEUTROABS 2.6  --   HGB 11.8* 11.6*  HCT 37.6 37.3  MCV 74.3* 73.9*  PLT 251 222   Cardiac Enzymes: No results for input(s): CKTOTAL, CKMB, CKMBINDEX, TROPONINI in the last 168 hours. BNP: BNP (last 3 results) No results for input(s): BNP in the last 8760 hours.  ProBNP (last 3 results) No results for input(s): PROBNP in the last 8760 hours.  CBG: Recent Labs  Lab 12/10/19 0601 12/10/19 1133 12/15/19 2312 12/16/19 0813 12/16/19 1153  GLUCAP 98 149* 147* 62* 120*       Signed:  Alma Friendly, MD [facility] 12/16/2019, 12:15 PM

## 2019-12-16 NOTE — ED Notes (Signed)
Family at bedside. 

## 2019-12-16 NOTE — Progress Notes (Signed)
MD states patient no longer needs PIV at this time, patient will be discharged. RN present at the nursing station made aware. He states he will update patient's primary nurse

## 2019-12-16 NOTE — ED Notes (Signed)
Three attempts made to place PIV. IV team consult placed.

## 2019-12-16 NOTE — ED Notes (Signed)
Pt drank approximately half of orange juice and had a few bites of breakfast. Pt instructed to let us know if she would like more breakfast.

## 2019-12-16 NOTE — ED Notes (Signed)
Lunch Tray Ordered @ 1032. 

## 2019-12-16 NOTE — ED Notes (Signed)
[facility] contacted regarding pt. Discharge. Message left with [facility] admission coordinator.

## 2019-12-17 ENCOUNTER — Non-Acute Institutional Stay (SKILLED_NURSING_FACILITY): Payer: Medicare Other | Admitting: Adult Health

## 2019-12-17 ENCOUNTER — Encounter: Payer: Self-pay | Admitting: Adult Health

## 2019-12-17 DIAGNOSIS — Z8673 Personal history of transient ischemic attack (TIA), and cerebral infarction without residual deficits: Secondary | ICD-10-CM | POA: Diagnosis not present

## 2019-12-17 DIAGNOSIS — R1312 Dysphagia, oropharyngeal phase: Secondary | ICD-10-CM | POA: Diagnosis not present

## 2019-12-17 DIAGNOSIS — I10 Essential (primary) hypertension: Secondary | ICD-10-CM

## 2019-12-17 LAB — CBC AND DIFFERENTIAL
HCT: 34 — AB (ref 36–46)
Hemoglobin: 10.8 — AB (ref 12.0–16.0)
Neutrophils Absolute: 2
Platelets: 232 (ref 150–399)
WBC: 5.9

## 2019-12-17 LAB — BASIC METABOLIC PANEL
BUN: 14 (ref 4–21)
CO2: 22 (ref 13–22)
Chloride: 107 (ref 99–108)
Creatinine: 1.1 (ref 0.5–1.1)
Glucose: 100
Potassium: 4.2 (ref 3.4–5.3)
Sodium: 141 (ref 137–147)

## 2019-12-17 LAB — COMPREHENSIVE METABOLIC PANEL
Calcium: 9 (ref 8.7–10.7)
GFR calc Af Amer: 66.95
GFR calc non Af Amer: 57.76

## 2019-12-17 LAB — CBC: RBC: 4.67 (ref 3.87–5.11)

## 2019-12-17 NOTE — Progress Notes (Signed)
Location:  [place] Room Number: [id number] Place of Service:  SNF (31) Provider:  [name], DNP, FNP-BC  Patient Care Team: [name], MD as PCP - General (Internal Medicine) Minus Breeding, MD as PCP - Cardiology (Cardiology)  Extended Emergency Contact Information Primary Emergency Contact: [name], [place] Phone: [phone] Relation: Brother Secondary Emergency Contact: [name] Phone: [phone] Relation: Other  Code Status:  Full Code  Goals of care: Advanced Directive information Advanced Directives 12/15/2019  Does Patient Have a Medical Advance Directive? No  Would patient like information on creating a medical advance directive? No - Guardian declined  Some encounter information is confidential and restricted. Go to Review Flowsheets activity to see all data.     Chief Complaint  Patient presents with  . Acute Visit    Patient is seen for dysphagia and hypertension.     HPI:  Pt is a 49 y.o. female seen today for dysphagia and hypertension.  States that short-term rehabilitation resident of [facility].  She has a PMH of COPD, OSA, IDDM with vascular complication, headaches, essential hypertension, dyslipidemia, depression, ischemic cerebrovascular disease, schizoaffective disorder and polysubstance abuse. BPs 158/79, 192/111, 156/91, 176/83, 147/83.  He denies having headache nor chest pains.  5 days ago amlodipine 10 mg daily was added to her regimen due to elevated BPs. She was already taking Zestril 40 mg daily and clonidine 0.3 mg twice a day for hypertension. She was sent to ED yesterday due to chest pain which was relieved with nitroglycerin and Morphine in the ED and was sent back to facility. Chest pain was thought to maybe related to hypertensive crisis. Troponin X 2 and EKG with no acute ST changes.  She had a recent CVA with  residual left-sided hemiparesis. She has difficulty swallowing. Discussed with speech therapist who will evaluate resident's swallowing.  Of note, she was having a short-term rehabilitation at [facility] post hospitalization 12/06/19 to 12/10/19 due to acute CVA.  TPA was not given because of history of hemorrhagic stroke.  CT of the head was negative for acute finding.  MRI of the brain showed acute small lacunar infarct in the left coronary radiata with no associated hemorrhage or mass-effect.  Neurology recommended Plavix and aspirin x3 weeks, followed by aspirin alone.  Past Medical History:  Diagnosis Date  . Anxiety   . Chronic lower back pain 04/17/2012   "just got over some; catched when I walked"  . COPD (chronic obstructive pulmonary disease) ([place])   . Critical lower limb ischemia 05/27/2018  . Depression   . Headache(784.0) 04/17/2012   "~ qod; lately waking up in am w/one"  . High cholesterol   . Hypertension   . Migraines 04/17/2012  . Obesity   . Sleep apnea    CPAP  . Stroke ([place])   . Type II diabetes mellitus ([place]) 08/28/2002   Past Surgical History:  Procedure Laterality Date  . ABDOMINAL AORTOGRAM W/LOWER EXTREMITY Left 05/27/2018   Procedure: ABDOMINAL AORTOGRAM W/LOWER EXTREMITY Runoff and Possible Intervention;  Surgeon: Waynetta Sandy, MD;  Location: [place] CV LAB;  Service: Cardiovascular;  Laterality: Left;  . APPLICATION OF WOUND VAC Left 06/06/2018   Procedure: APPLICATION OF WOUND VAC;  Surgeon: Waynetta Sandy, MD;  Location: [place];  Service: Vascular;  Laterality: Left;  . BIOPSY  04/04/2018   Procedure: BIOPSY;  Surgeon: [name],  MD;  Location: [place];  Service: Endoscopy;;  . CARDIAC CATHETERIZATION  ~ 2011  . COLONOSCOPY N/A 04/04/2018   Procedure: COLONOSCOPY;  Surgeon: [name], MD;  Location: [facility] ENDOSCOPY;  Service: Endoscopy;  Laterality: N/A;  . LOWER EXTREMITY ANGIOGRAM Left 05/27/2018  . PERIPHERAL VASCULAR  INTERVENTION Left 05/27/2018   Procedure: PERIPHERAL VASCULAR INTERVENTION;  Surgeon: Waynetta Sandy, MD;  Location: [place] CV LAB;  Service: Cardiovascular;  Laterality: Left;  . POLYPECTOMY  04/04/2018   Procedure: POLYPECTOMY;  Surgeon: [name], MD;  Location: [facility] ENDOSCOPY;  Service: Endoscopy;;  . TRANSMETATARSAL AMPUTATION Left 05/28/2018   Procedure: AMPUTATION TOES THREE, FOUR AND FIVE on left foot;  Surgeon: Waynetta Sandy, MD;  Location: [place];  Service: Vascular;  Laterality: Left;  . WOUND DEBRIDEMENT Left 06/06/2018   Procedure: DEBRIDEMENT WOUND LEFT FOOT;  Surgeon: Waynetta Sandy, MD;  Location: [place];  Service: Vascular;  Laterality: Left;    Allergies  Allergen Reactions  . Morphine And Related Hives  . Metformin Diarrhea and Other (See Comments)    "Allergic," per [facility]    Outpatient Encounter Medications as of 12/17/2019  Medication Sig  . amLODipine (NORVASC) 10 MG tablet Take 10 mg by mouth daily.  . ARIPiprazole (ABILIFY) 15 MG tablet Take 1 tablet (15 mg total) by mouth at bedtime.  Marland Kitchen aspirin EC 81 MG EC tablet Take 1 tablet (81 mg total) by mouth daily.  Marland Kitchen atorvastatin (LIPITOR) 40 MG tablet Take 1 tablet (40 mg total) by mouth daily at 6 PM.  . bisacodyl (DULCOLAX) 10 MG suppository Place 10 mg rectally daily as needed for mild constipation or moderate constipation (if no relief from Milk of Magnesia).  . cilostazol (PLETAL) 100 MG tablet Take 100 mg by mouth 2 (two) times daily.  . cloNIDine (CATAPRES) 0.3 MG tablet Take 0.3 mg by mouth 2 (two) times daily.  . clopidogrel (PLAVIX) 75 MG tablet Take 1 tablet (75 mg total) by mouth daily.  . ferrous sulfate 325 (65 FE) MG tablet Take 1 tablet (325 mg total) by mouth daily with breakfast.  . hydrALAZINE (APRESOLINE) 100 MG tablet Take 100 mg by mouth 3 (three) times daily.  . Insulin Detemir (LEVEMIR) 100 UNIT/ML Pen Inject 15 Units into the skin 2 (two) times daily.  Marland Kitchen  lisinopril (ZESTRIL) 40 MG tablet Take 40 mg by mouth daily.  . magnesium hydroxide (MILK OF MAGNESIA) 400 MG/5ML suspension Take 30 mLs by mouth daily as needed for mild constipation.  . Sodium Phosphates (RA SALINE ENEMA) 19-7 GM/118ML ENEM Place 1 enema rectally daily as needed (if no relief from Dulcolax suppository for constipation and call MD if no relief from enema).  . [DISCONTINUED] PRESCRIPTION MEDICATION CPAP- at bedtime   No facility-administered encounter medications on file as of 12/17/2019.    Review of Systems  GENERAL: No change in appetite, no fatigue, no weight changes, no fever, chills or weakness MOUTH and THROAT: Denies oral discomfort, gingival pain or bleeding RESPIRATORY: no cough, SOB, DOE, wheezing, hemoptysis CARDIAC: No chest pain, edema or palpitations GI: No abdominal pain, diarrhea, constipation, heart burn, nausea or vomiting GU: Denies dysuria, frequency, hematuria, incontinence, or discharge NEUROLOGICAL: Denies dizziness, syncope, numbness, or headache PSYCHIATRIC: Denies feelings of depression or anxiety. No report of hallucinations, insomnia, paranoia, or agitation   Immunization History  Administered Date(s) Administered  . Influenza Inj Mdck Quad Pf 07/16/2019  . Influenza,inj,Quad PF,6+ Mos 05/24/2013, 08/29/2014, 10/08/2015  . Influenza-Unspecified 06/22/2016, 07/01/2018  .  PPD Test 11/10/2016  . Pneumococcal Polysaccharide-23 04/18/2012, 10/08/2015, 02/14/2017   Pertinent  Health Maintenance Due  Topic Date Due  . PAP SMEAR-Modifier  Never done  . OPHTHALMOLOGY EXAM  08/13/2014  . FOOT EXAM  10/15/2015  . INFLUENZA VACCINE  03/28/2020  . HEMOGLOBIN A1C  06/07/2020   Fall Risk  12/07/2014 10/14/2014  Falls in the past year? No No     Vitals:   12/17/19 0832  BP: (!) 192/111  Pulse: 78  Resp: 18  Temp: 97.7 F (36.5 C)  TempSrc: Oral  SpO2: 96%  Weight: 179 lb 3.2 oz (81.3 kg)  Height: 5\' 1"  (1.549 m)   Body mass index is  33.86 kg/m.  Physical Exam  GENERAL APPEARANCE: Well nourished. In no acute distress. Obese SKIN:  Skin is warm and dry.  MOUTH and THROAT: Lips are without lesions. Oral mucosa is moist and without lesions.  RESPIRATORY: Breathing is even & unlabored, BS CTAB CARDIAC: RRR, no murmur,no extra heart sounds, no edema GI: Abdomen soft, normal BS, no masses, no tenderness NEUROLOGICAL: There is no tremor. Speech is low volume. Left-sided weakness. PSYCHIATRIC: Affect and behavior are appropriate  Labs reviewed: Recent Labs    12/08/19 0342 12/08/19 0342 12/09/19 0332 12/10/19 0500 12/15/19 2043  NA 142   < > 141 142 140  K 3.2*   < > 3.8 3.7 3.5  CL 107   < > 110 110 107  CO2 23   < > 21* 23 22  GLUCOSE 88   < > 100* 85 132*  BUN 14   < > 12 12 15   CREATININE 1.44*   < > 1.36* 1.48* 1.26*  CALCIUM 8.9   < > 9.0 9.3 9.1  MG 2.0  --  2.1 2.2  --    < > = values in this interval not displayed.   Recent Labs    06/26/19 1638 12/06/19 1728 12/08/19 0342  AST 14* 17 12*  ALT 11 8 8   ALKPHOS 60 49 51  BILITOT 0.9 0.4 1.0  PROT 7.5 7.0 6.6  ALBUMIN 3.5 3.0* 3.1*   Recent Labs    12/08/19 0342 12/08/19 0342 12/09/19 0332 12/10/19 0500 12/15/19 2043  WBC 6.1   < > 7.0 7.3 5.4  NEUTROABS 2.4  --  3.1 2.6  --   HGB 10.9*   < > 11.7* 11.8* 11.6*  HCT 33.9*   < > 37.4 37.6 37.3  MCV 73.5*   < > 73.0* 74.3* 73.9*  PLT 238   < > 263 251 222   < > = values in this interval not displayed.   Lab Results  Component Value Date   TSH 0.705 04/02/2018   Lab Results  Component Value Date   HGBA1C 6.7 (H) 12/07/2019   Lab Results  Component Value Date   CHOL 158 12/07/2019   HDL 45 12/07/2019   LDLCALC 95 12/07/2019   TRIG 89 12/07/2019   CHOLHDL 3.5 12/07/2019    Significant Diagnostic Results in last 30 days:  MR ANGIO HEAD WO CONTRAST  Result Date: 12/06/2019 CLINICAL DATA:  49 year old female with acute on chronic small vessel disease. EXAM: MRA HEAD WITHOUT  CONTRAST TECHNIQUE: Angiographic images of the Circle of Willis were obtained using MRA technique without intravenous contrast. COMPARISON:  Brain MRI today reported separately. Intracranial MRA 09/02/2018. FINDINGS: Chronically diminutive posterior circulation. Antegrade flow appears stable in diminutive distal vertebral arteries, the left terminates in PICA. The right PICA origin remains  patent. The right vertebral supplies a patent but diminutive basilar artery. There is moderate to severe stenosis at the basilar artery origin which may be progressed from last year (series 1038, image 7). Fetal type bilateral PCA origins. The basilar terminates in the SCA is which remain patent. Bilateral fetal type PCA origins. Severe right PCA P1 and left PCA P2 stenoses are redemonstrated and stable. Stable antegrade flow in both ICA siphons. Normal posterior communicating artery origins. Mild to moderate bilateral siphon anterior genu/supraclinoid irregularity appears stable with mild stenosis. Carotid termini remain patent. MCA and ACA origins remain patent but there is new moderate stenosis at the left A1 origin (series 1030, image 10). MCA M1 segments and bifurcations remain patent with mild irregularity. Visible bilateral MCA and ACA branches are stable with mild irregularity. IMPRESSION: 1. Chronically age advanced intracranial atherosclerosis. Negative for large vessel occlusion. 2. Diminutive vertebrobasilar system on the basis of fetal type PCA origins, but evidence of hemodynamically significant stenosis at the Vertebrobasilar Junction which appears progressed from last year. Chronic severe stenoses of the Right P1 and Left P2. 3. Stable mild stenoses of the bilateral ICA anterior genu. New moderate stenosis of the Left ACA origin. Electronically Signed   By: Genevie Ann M.D.   On: 12/06/2019 19:17   MR BRAIN WO CONTRAST  Result Date: 12/06/2019 CLINICAL DATA:  Code stroke. 49 year old female with slurred speech.  History of prior stroke. EXAM: MRI HEAD WITHOUT CONTRAST TECHNIQUE: Multiplanar, multiecho pulse sequences of the brain and surrounding structures were obtained without intravenous contrast. COMPARISON:  Head CT earlier today. Brain MRI 01/14/2018. FINDINGS: Brain: Oval 10 mm focus of restricted diffusion in the left corona radiata tracking into the posterior left lentiform (series 5, image 73). Faint T2 and FLAIR hyperintensity. No hemorrhage or mass effect. No other restricted diffusion. Small chronic infarct in the deep left cerebellar nuclei is new since 2019. Extensive chronic small vessel infarcts scattered in the bilateral basal ganglia, thalami, right corona radiata. There is Wallerian degeneration in the right brainstem. Scattered chronic microhemorrhages, with abundant hemosiderin in the posterior right lentiform. Additional scattered and confluent bilateral white matter T2 and FLAIR hyperintensity. No definite cortical encephalomalacia. No midline shift, mass effect, evidence of mass lesion, ventriculomegaly, extra-axial collection or acute intracranial hemorrhage. Cervicomedullary junction and pituitary are within normal limits. Vascular: Major intracranial vascular flow voids are stable since 2019. Skull and upper cervical spine: Negative visible cervical spine and bone marrow signal. Sinuses/Orbits: Negative orbits. Paranasal Visualized paranasal sinuses and mastoids are stable and well pneumatized. Other: Visible internal auditory structures appear normal. Scalp and face soft tissues appear negative. IMPRESSION: 1. Small acute lacunar infarct in the left corona radiata tracking into the posterior left lentiform. No associated hemorrhage or mass effect. 2. Underlying very severe chronic small vessel disease. 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 Electronically Signed   By: Genevie Ann M.D.   On: 12/06/2019 19:11   DG Chest Port 1 View  Result Date: 12/15/2019 CLINICAL DATA:   49 year old female with cough. EXAM: PORTABLE CHEST 1 VIEW COMPARISON:  Chest radiograph dated 09/12/2018. FINDINGS: Stable cardiomegaly. There are bibasilar atelectatic changes. No focal consolidation, pleural effusion, pneumothorax. Atherosclerotic calcification of the aorta. No acute osseous pathology. IMPRESSION: No active disease. Electronically Signed   By: Anner Crete M.D.   On: 12/15/2019 21:08   DG Swallowing Func-Speech Pathology  Result Date: 12/08/2019 Objective Swallowing Evaluation: Type of Study: MBS-Modified Barium Swallow Study  Patient Details Name: Aryana Mcgary MRN:  [id number] Date of Birth: 11-Dec-1970 Today's Date: 12/08/2019 Time: SLP Start Time (ACUTE ONLY): 0805 -SLP Stop Time (ACUTE ONLY): 0826 SLP Time Calculation (min) (ACUTE ONLY): 21 min Past Medical History: Past Medical History: Diagnosis Date . Anxiety  . Chronic lower back pain 04/17/2012  "just got over some; catched when I walked" . COPD (chronic obstructive pulmonary disease) ([place])  . Critical lower limb ischemia 05/27/2018 . Depression  . Headache(784.0) 04/17/2012  "~ qod; lately waking up in am w/one" . High cholesterol  . Hypertension  . Migraines 04/17/2012 . Obesity  . Sleep apnea   CPAP . Stroke ([place])  . Type II diabetes mellitus ([place]) 08/28/2002 Past Surgical History: Past Surgical History: Procedure Laterality Date . ABDOMINAL AORTOGRAM W/LOWER EXTREMITY Left 05/27/2018  Procedure: ABDOMINAL AORTOGRAM W/LOWER EXTREMITY Runoff and Possible Intervention;  Surgeon: Waynetta Sandy, MD;  Location: [place] [facility] LAB;  Service: Cardiovascular;  Laterality: Left; . APPLICATION OF WOUND VAC Left 0000000  Procedure: APPLICATION OF WOUND VAC;  Surgeon: Waynetta Sandy, MD;  Location: [place];  Service: Vascular;  Laterality: Left; . BIOPSY  04/04/2018  Procedure: BIOPSY;  Surgeon: [name], MD;  Location: [facility] ENDOSCOPY;  Service: Endoscopy;; . CARDIAC CATHETERIZATION  ~ 2011 . COLONOSCOPY N/A 04/04/2018   Procedure: COLONOSCOPY;  Surgeon: [name], MD;  Location: [facility] ENDOSCOPY;  Service: Endoscopy;  Laterality: N/A; . LOWER EXTREMITY ANGIOGRAM Left 05/27/2018 . PERIPHERAL VASCULAR INTERVENTION Left 05/27/2018  Procedure: PERIPHERAL VASCULAR INTERVENTION;  Surgeon: Waynetta Sandy, MD;  Location: [place] CV LAB;  Service: Cardiovascular;  Laterality: Left; . POLYPECTOMY  04/04/2018  Procedure: POLYPECTOMY;  Surgeon: [name], MD;  Location: [facility] ENDOSCOPY;  Service: Endoscopy;; . TRANSMETATARSAL AMPUTATION Left 05/28/2018  Procedure: AMPUTATION TOES THREE, FOUR AND FIVE on left foot;  Surgeon: Waynetta Sandy, MD;  Location: [place];  Service: Vascular;  Laterality: Left; . WOUND DEBRIDEMENT Left 06/06/2018  Procedure: DEBRIDEMENT WOUND LEFT FOOT;  Surgeon: Waynetta Sandy, MD;  Location: [facility] OR;  Service: Vascular;  Laterality: Left; HPI: 49 year old female with history of hemorrhagic stroke in 10/07/2018 with residual left sided weakness, COPD, hypertension, hyperlipidemia, diabetes mellitus type 2, OSA on CPAP, anxiety, depression, substance abuse (marijuana, crack cocaine) presented with slurred speech. MRI revealed acute small lacunar infarct in left corona radiata. BSE 1/21020 rec'd Dys 3, thin. MBS 09/18/18 oropharyngeal swallow WFL. Espohageal retention mid esophagus and w/u rec'd  Subjective: alert, cooperative Assessment / Plan / Recommendation CHL IP CLINICAL IMPRESSIONS 12/08/2019 Clinical Impression  Patient presents with mild oropharyngeal dysphagia. Oral phase is remarkable for decreased bolus cohesion, premature spillage to the vallecula and pyriform sinuses and lingual residue. When trialing a regular solid, the pt stated she was going to swallow the bolus before attempting to masticate, requiring Min cues to continue to fully masticate prior to swallowing. Pharyngeal phase is remarkable for delayed swallow initiation at the pyriform sinuses and reduced laryngeal  closure.  Patient's vallecular space was also noted to be small, making it difficult for the bolus to collect there prior to the swallow. Liquids would either spill over into the larynx or to the pyriform sinsues. The small vallecular space and reduced laryngeal closure ultimately resulted in penetration/aspiration. Aspiration (PAS 7 or 8) occurred during the swallow with thin liquids. As the pt was challenged by taking large consecutive sips, she aspirated, spit out what was remaining in her oral cavity and was noted with suspected nasopharyngeal regurgitation (nasal secretions and what appeared to be some barium expelled from  her nasal cavity). The fluoro was not on during suspected nasopharyngeal regurgitation, therefore cannot confirm. A chin tuck was trialed to attempt to increase the space in the vallecula, however, this was unsuccessful. Recommend Dys 2 diet, nectar thick liquids, meds crushed in puree, with full supervision. Ensure the pt is taking small, single sips at a time and is following general aspiration precautions.  SLP Visit Diagnosis Dysphagia, oropharyngeal phase (R13.12) Attention and concentration deficit following -- Frontal lobe and executive function deficit following -- Impact on safety and function Moderate aspiration risk   CHL IP TREATMENT RECOMMENDATION 12/08/2019 Treatment Recommendations Therapy as outlined in treatment plan below   Prognosis 12/08/2019 Prognosis for Safe Diet Advancement Good Barriers to Reach Goals -- Barriers/Prognosis Comment -- CHL IP DIET RECOMMENDATION 12/08/2019 SLP Diet Recommendations Dysphagia 2 (Fine chop) solids;Nectar thick liquid Liquid Administration via Cup;Straw Medication Administration Crushed with puree Compensations Minimize environmental distractions;Slow rate;Small sips/bites Postural Changes Remain semi-upright after after feeds/meals (Comment);Seated upright at 90 degrees   CHL IP OTHER RECOMMENDATIONS 12/08/2019 Recommended Consults -- Oral  Care Recommendations Oral care BID Other Recommendations Prohibited food (jello, ice cream, thin soups);Order thickener from pharmacy;Remove water pitcher   CHL IP FOLLOW UP RECOMMENDATIONS 12/08/2019 Follow up Recommendations Skilled Nursing facility   [facility] IP FREQUENCY AND DURATION 12/08/2019 Speech Therapy Frequency (ACUTE ONLY) min 2x/week Treatment Duration 2 weeks      CHL IP ORAL PHASE 12/08/2019 Oral Phase Impaired Oral - Pudding Teaspoon -- Oral - Pudding Cup -- Oral - Honey Teaspoon -- Oral - Honey Cup -- Oral - Nectar Teaspoon -- Oral - Nectar Cup Lingual/palatal residue Oral - Nectar Straw Lingual/palatal residue Oral - Thin Teaspoon -- Oral - Thin Cup Decreased bolus cohesion;Premature spillage Oral - Thin Straw Decreased bolus cohesion;Premature spillage Oral - Puree WFL Oral - Mech Soft -- Oral - Regular Delayed oral transit Oral - Multi-Consistency -- Oral - Pill -- Oral Phase - Comment --  CHL IP PHARYNGEAL PHASE 12/08/2019 Pharyngeal Phase Impaired Pharyngeal- Pudding Teaspoon -- Pharyngeal -- Pharyngeal- Pudding Cup -- Pharyngeal -- Pharyngeal- Honey Teaspoon -- Pharyngeal -- Pharyngeal- Honey Cup -- Pharyngeal -- Pharyngeal- Nectar Teaspoon -- Pharyngeal -- Pharyngeal- Nectar Cup Penetration/Aspiration during swallow;Reduced airway/laryngeal closure Pharyngeal Material enters airway, remains ABOVE vocal cords then ejected out Pharyngeal- Nectar Straw Penetration/Aspiration during swallow;Reduced airway/laryngeal closure Pharyngeal Material enters airway, remains ABOVE vocal cords then ejected out Pharyngeal- Thin Teaspoon -- Pharyngeal -- Pharyngeal- Thin Cup Penetration/Aspiration during swallow;Trace aspiration;Delayed swallow initiation-pyriform sinuses;Compensatory strategies attempted (with notebox);Other (Comment);Reduced airway/laryngeal closure Pharyngeal Material enters airway, passes BELOW cords without attempt by patient to eject out (silent aspiration) Pharyngeal- Thin Straw  Penetration/Aspiration during swallow;Trace aspiration;Delayed swallow initiation-pyriform sinuses;Compensatory strategies attempted (with notebox);Other (Comment);Reduced airway/laryngeal closure Pharyngeal Material enters airway, passes BELOW cords without attempt by patient to eject out (silent aspiration) Pharyngeal- Puree WFL Pharyngeal -- Pharyngeal- Mechanical Soft -- Pharyngeal -- Pharyngeal- Regular WFL Pharyngeal -- Pharyngeal- Multi-consistency -- Pharyngeal -- Pharyngeal- Pill -- Pharyngeal -- Pharyngeal Comment --  CHL IP CERVICAL ESOPHAGEAL PHASE 12/08/2019 Cervical Esophageal Phase WFL Pudding Teaspoon -- Pudding Cup -- Honey Teaspoon -- Honey Cup -- Nectar Teaspoon -- Nectar Cup -- Nectar Straw -- Thin Teaspoon -- Thin Cup -- Thin Straw -- Puree -- Mechanical Soft -- Regular -- Multi-consistency -- Pill -- Cervical Esophageal Comment -- Osie Bond., M.A. [facility] Pager [phone] Office [phone] 12/08/2019, 9:45 AM              ECHOCARDIOGRAM COMPLETE  Result Date:  12/07/2019    ECHOCARDIOGRAM REPORT   Patient Name:   COURTENEY INTERRANTE Date of Exam: 12/07/2019 Medical Rec #:  [id number]         Height:       61.0 in Accession #:    [id number]        Weight:       195.0 lb Date of Birth:  19-Mar-1971          BSA:          1.869 m Patient Age:    50 years          BP:           168/88 mmHg Patient Gender: F                 HR:           50 bpm. Exam Location:  Inpatient Procedure: 2D Echo Indications:    Stroke 434.91 / I163.9  History:        Patient has prior history of Echocardiogram examinations, most                 recent 06/24/2018. Signs/Symptoms:Dyspnea; Risk Factors:Tobacco                 abuse, Hypertension, Diabetes and Dyslipidemia. Cocaine abuse                 Acute renal failure                 Aortic valve insufficiency.  Sonographer:    Vikki Ports Turrentine Referring Phys: [id number] [place]  1. Left ventricular ejection fraction, by  estimation, is 65 to 70%. The left ventricle has normal function. The left ventricle has no regional wall motion abnormalities. There is moderate concentric left ventricular hypertrophy. Left ventricular diastolic parameters are consistent with Grade I diastolic dysfunction (impaired relaxation). Elevated left ventricular end-diastolic pressure.  2. Right ventricular systolic function is normal. The right ventricular size is normal. Tricuspid regurgitation signal is inadequate for assessing PA pressure.  3. The mitral valve is normal in structure. Trivial mitral valve regurgitation. No evidence of mitral stenosis.  4. The aortic valve is tricuspid. Aortic valve regurgitation is mild to mdoerate. Aortic valve mean gradient measures 9.0 mmHg. Aortic valve peak gradient measures 16.4 mmHg. Aortic valve area, by VTI measures 1.53 cm. Very mild aortic stenosis.  5. The inferior vena cava is normal in size with greater than 50% respiratory variability, suggesting right atrial pressure of 3 mmHg.  6. Left atrial size was moderately dilated. FINDINGS  Left Ventricle: Left ventricular ejection fraction, by estimation, is 65 to 70%. The left ventricle has normal function. The left ventricle has no regional wall motion abnormalities. The left ventricular internal cavity size was normal in size. There is  moderate concentric left ventricular hypertrophy. Left ventricular diastolic parameters are consistent with Grade I diastolic dysfunction (impaired relaxation). Elevated left ventricular end-diastolic pressure. Right Ventricle: The right ventricular size is normal. No increase in right ventricular wall thickness. Right ventricular systolic function is normal. Tricuspid regurgitation signal is inadequate for assessing PA pressure. Left Atrium: Left atrial size was moderately dilated. Right Atrium: Right atrial size was normal in size. Pericardium: There is no evidence of pericardial effusion. Mitral Valve: The mitral valve is  normal in structure. Normal mobility of the mitral valve leaflets. Trivial mitral valve regurgitation. No evidence of mitral valve stenosis. Tricuspid Valve: The tricuspid valve is normal in structure. Tricuspid valve  regurgitation is trivial. No evidence of tricuspid stenosis. Aortic Valve: The aortic valve is tricuspid. Aortic valve regurgitation is mild to moderate. Aortic regurgitation PHT measures 944 msec. There is moderate calcification of the aortic valve. Aortic valve mean gradient measures 9.0 mmHg. Aortic valve peak gradient measures 16.4 mmHg. Aortic valve area, by VTI measures 1.53 cm. Very mild aortic stenosis. Pulmonic Valve: The pulmonic valve was normal in structure. Pulmonic valve regurgitation is not visualized. No evidence of pulmonic stenosis. Aorta: The aortic root is normal in size and structure. Venous: The inferior vena cava is normal in size with greater than 50% respiratory variability, suggesting right atrial pressure of 3 mmHg. IAS/Shunts: No atrial level shunt detected by color flow Doppler.  LEFT VENTRICLE PLAX 2D LVIDd:         3.60 cm  Diastology LVIDs:         2.20 cm  LV e' lateral:   4.03 cm/s LV PW:         1.40 cm  LV E/e' lateral: 19.3 LV IVS:        1.40 cm  LV e' medial:    5.22 cm/s LVOT diam:     1.70 cm  LV E/e' medial:  14.9 LV SV:         64 LV SV Index:   34 LVOT Area:     2.27 cm  RIGHT VENTRICLE RV S prime:     15.30 cm/s TAPSE (M-mode): 2.7 cm LEFT ATRIUM             Index       RIGHT ATRIUM           Index LA diam:        4.50 cm 2.41 cm/m  RA Area:     19.90 cm LA Vol (A2C):   91.5 ml 48.97 ml/m RA Volume:   51.10 ml  27.35 ml/m LA Vol (A4C):   75.3 ml 40.30 ml/m LA Biplane Vol: 85.9 ml 45.97 ml/m  AORTIC VALVE AV Area (Vmax):    1.48 cm AV Area (Vmean):   1.46 cm AV Area (VTI):     1.53 cm AV Vmax:           202.50 cm/s AV Vmean:          141.500 cm/s AV VTI:            0.416 m AV Peak Grad:      16.4 mmHg AV Mean Grad:      9.0 mmHg LVOT Vmax:          132.00 cm/s LVOT Vmean:        91.000 cm/s LVOT VTI:          0.280 m LVOT/AV VTI ratio: 0.67 AI PHT:            944 msec  AORTA Ao Root diam: 2.80 cm MITRAL VALVE MV Area (PHT): 2.48 cm     SHUNTS MV Decel Time: 306 msec     Systemic VTI:  0.28 m MV E velocity: 77.70 cm/s   Systemic Diam: 1.70 cm MV A velocity: 114.00 cm/s MV E/A ratio:  0.68 Fransico Him MD Electronically signed by Fransico Him MD Signature Date/Time: 12/07/2019/2:07:10 PM    Final    CT HEAD CODE STROKE WO CONTRAST  Result Date: 12/06/2019 CLINICAL DATA:  Code stroke. 49 year old female with slurred speech. History of prior stroke. EXAM: CT HEAD WITHOUT CONTRAST TECHNIQUE: Contiguous axial images were obtained from the base  of the skull through the vertex without intravenous contrast. COMPARISON:  Head CT 06/26/2019. Brain MRI 09/02/2018. FINDINGS: Brain: Advanced chronic cerebral white matter and deep gray nuclei hypodensity. No acute intracranial hemorrhage identified. No midline shift, mass effect, or evidence of intracranial mass lesion. No ventriculomegaly. Small chronic infarct in the left cerebellum. No cortically based acute infarct identified. Vascular: Calcified atherosclerosis at the skull base. No suspicious intracranial vascular hyperdensity. Skull: Stable hyperostosis. No acute osseous abnormality identified. Sinuses/Orbits: Visualized paranasal sinuses and mastoids are stable and well pneumatized. Other: Leftward gaze. Negative scalp soft tissues. ASPECTS [facility] Stroke Program Early CT Score) Total score (0-10 with 10 being normal): 10 IMPRESSION: 1. Stable CT appearance of advanced chronic small vessel disease since October. ASPECTS 10. 2. These results were communicated to Dr. Lorraine Lax at 5:37 pmon 4/10/2021by text page via the [facility] messaging system. Electronically Signed   By: Genevie Ann M.D.   On: 12/06/2019 17:38   VAS US CAROTID  Result Date: 12/08/2019 Carotid Arterial Duplex Study Indications:       CVA and Speech  disturbance. Risk Factors:      Hypertension, hyperlipidemia, Diabetes, prior CVA. Other Factors:     Substance abuse. Comparison Study:  No prior study on file Performing Technologist: Sharion Dove RVS  Examination Guidelines: A complete evaluation includes B-mode imaging, spectral Doppler, color Doppler, and power Doppler as needed of all accessible portions of each vessel. Bilateral testing is considered an integral part of a complete examination. Limited examinations for reoccurring indications may be performed as noted.  Right Carotid Findings: +----------+--------+--------+--------+------------------+------------------+           PSV cm/sEDV cm/sStenosisPlaque DescriptionComments           +----------+--------+--------+--------+------------------+------------------+ CCA Prox  1       0                                 intimal thickening +----------+--------+--------+--------+------------------+------------------+ CCA Distal1       0                                 intimal thickening +----------+--------+--------+--------+------------------+------------------+ ICA Prox  1       0               heterogenous      tortuous           +----------+--------+--------+--------+------------------+------------------+ ICA Distal1       0                                 tortuous           +----------+--------+--------+--------+------------------+------------------+ ECA       1       0                                                    +----------+--------+--------+--------+------------------+------------------+ +----------+--------+-------+--------+-------------------+           PSV cm/sEDV cmsDescribeArm Pressure (mmHG) +----------+--------+-------+--------+-------------------+ Subclavian1                                          +----------+--------+-------+--------+-------------------+ +---------+--------+-+--------+-+  VertebralPSV cm/s0EDV cm/s0  +---------+--------+-+--------+-+  Left Carotid Findings: +----------+--------+--------+--------+------------------+------------------+           PSV cm/sEDV cm/sStenosisPlaque DescriptionComments           +----------+--------+--------+--------+------------------+------------------+ CCA Prox  1       0                                 intimal thickening +----------+--------+--------+--------+------------------+------------------+ CCA Distal1       0                                 intimal thickening +----------+--------+--------+--------+------------------+------------------+ ICA Prox  1       0                                                    +----------+--------+--------+--------+------------------+------------------+ ICA Distal1       0                                 tortuous           +----------+--------+--------+--------+------------------+------------------+ ECA       1       0                                                    +----------+--------+--------+--------+------------------+------------------+ +----------+--------+--------+--------+-------------------+           PSV cm/sEDV cm/sDescribeArm Pressure (mmHG) +----------+--------+--------+--------+-------------------+ [id number]                                           +----------+--------+--------+--------+-------------------+ +---------+--------+-+--------+-+ VertebralPSV cm/s0EDV cm/s0 +---------+--------+-+--------+-+   Summary: Right Carotid: The extracranial vessels were near-normal with only minimal wall                thickening or plaque. Left Carotid: The extracranial vessels were near-normal with only minimal wall               thickening or plaque. Vertebrals:  Bilateral vertebral arteries demonstrate antegrade flow. Subclavians: Normal flow hemodynamics were seen in bilateral subclavian              arteries. *See table(s) above for measurements and observations.   Electronically signed by Antony Contras MD on 12/08/2019 at 1:55:53 PM.    Final     Assessment/Plan  1. Essential hypertension -  Will continue Amlodipine, Zestril and Clonidine - monitor BPs  2. Oropharyngeal dysphagia - continue mechanical soft diet with nectar thick liquid - aspiration precautions - discussed with speech therapist who will asses resident's swallowing  3. History of CVA (cerebrovascular accident) - continue ASA 81 mg daily and Atorvastatin - Plavix has been  After 3 weeks - continue PT and OT for therapeutic strengthening exercises    Family/ staff Communication:  Discussed plan of care with resident and charge nurse.  Labs/tests ordered:  None  Goals of care:   Short-term care   Jovian Lembcke Medina-Vargas,  DNP, FNP-BC [facility] [phone] (Monday-Friday 8:00 a.m. - 5:00 p.m.) [phone] (after hours)

## 2019-12-25 ENCOUNTER — Encounter: Payer: Self-pay | Admitting: Adult Health

## 2019-12-25 ENCOUNTER — Non-Acute Institutional Stay (SKILLED_NURSING_FACILITY): Payer: Medicare Other | Admitting: Adult Health

## 2019-12-25 DIAGNOSIS — E1151 Type 2 diabetes mellitus with diabetic peripheral angiopathy without gangrene: Secondary | ICD-10-CM | POA: Diagnosis not present

## 2019-12-25 DIAGNOSIS — I10 Essential (primary) hypertension: Secondary | ICD-10-CM | POA: Diagnosis not present

## 2019-12-25 DIAGNOSIS — F250 Schizoaffective disorder, bipolar type: Secondary | ICD-10-CM

## 2019-12-25 DIAGNOSIS — F5101 Primary insomnia: Secondary | ICD-10-CM | POA: Diagnosis not present

## 2019-12-25 NOTE — Progress Notes (Signed)
Location:  [place] Room Number: [id number] Place of Service:  SNF (31) Provider:  [name], DNP, FNP-BC  Patient Care Team: [name], MD as PCP - General (Internal Medicine) Minus Breeding, MD as PCP - Cardiology (Cardiology)  Extended Emergency Contact Information Primary Emergency Contact: [name], [employer] [place] Phone: [phone] Relation: Brother Secondary Emergency Contact: [name] Phone: [phone] Relation: Other  Code Status:  Full Code  Goals of care: Advanced Directive information Advanced Directives 12/15/2019  Does Patient Have a Medical Advance Directive? No  Would patient like information on creating a medical advance directive? No - Guardian declined  Some encounter information is confidential and restricted. Go to Review Flowsheets activity to see all data.     Chief Complaint  Patient presents with  . Medical Management of Chronic Issues    Routine short-term rehabilitation visit    HPI:  Pt is a 49 y.o. female seen today for a routine short-term rehabilitation visit.  She is a short-term rehabilitation resident of [facility].  She has a PMH of COPD, OSA, IDDM with vascular complication, headaches, essential hypertension, dyslipidemia, depression, ischemic PVD, schizoaffective disorder and polysubstance abuse. Review of BP noted that SBPs ranges from 135-184. She denies headache nor SOB. She complains of inability to sleep at night.   She was admitted to [facility] on 12/10/19 post hospitalization 12/06/2019 to 12/10/19 for acute CVA.  tPA was not given because of history of hemorrhagic stroke.  CT of the head was negative for acute findings.  MRI of the brain showed acute small lacunar infarct in the left coronary radiator with no associated hemorrhage or mass-effect.  Neurology recommended Plavix and aspirin x3  weeks, followed by aspirin alone.  She was sent to ED on 12/16/2019 for chest pain which was relieved by morphine and nitroglycerin. No acute ST changes and troponin x2 done. Chest pain was thought to be related to hypertensive crisis. She was discharged back to SNF.   Past Medical History:  Diagnosis Date  . Anxiety   . Chronic lower back pain 04/17/2012   "just got over some; catched when I walked"  . COPD (chronic obstructive pulmonary disease) ([place])   . Critical lower limb ischemia 05/27/2018  . Depression   . Headache(784.0) 04/17/2012   "~ qod; lately waking up in am w/one"  . High cholesterol   . Hypertension   . Migraines 04/17/2012  . Obesity   . Sleep apnea    CPAP  . Stroke ([place])   . Type II diabetes mellitus ([place]) 08/28/2002   Past Surgical History:  Procedure Laterality Date  . ABDOMINAL AORTOGRAM W/LOWER EXTREMITY Left 05/27/2018   Procedure: ABDOMINAL AORTOGRAM W/LOWER EXTREMITY Runoff and Possible Intervention;  Surgeon: Waynetta Sandy, MD;  Location: [place] CV LAB;  Service: Cardiovascular;  Laterality: Left;  . APPLICATION OF WOUND VAC Left 06/06/2018   Procedure: APPLICATION OF WOUND VAC;  Surgeon: Waynetta Sandy, MD;  Location: [place];  Service: Vascular;  Laterality: Left;  . BIOPSY  04/04/2018   Procedure: BIOPSY;  Surgeon: [name], MD;  Location: [facility] ENDOSCOPY;  Service: Endoscopy;;  . CARDIAC CATHETERIZATION  ~ 2011  . COLONOSCOPY N/A 04/04/2018   Procedure: COLONOSCOPY;  Surgeon: [name], MD;  Location: [facility] ENDOSCOPY;  Service: Endoscopy;  Laterality: N/A;  . LOWER EXTREMITY ANGIOGRAM Left 05/27/2018  . PERIPHERAL VASCULAR INTERVENTION  Left 05/27/2018   Procedure: PERIPHERAL VASCULAR INTERVENTION;  Surgeon: Waynetta Sandy, MD;  Location: [place] CV LAB;  Service: Cardiovascular;  Laterality: Left;  . POLYPECTOMY  04/04/2018   Procedure: POLYPECTOMY;  Surgeon: [name], MD;  Location: [facility] ENDOSCOPY;  Service:  Endoscopy;;  . TRANSMETATARSAL AMPUTATION Left 05/28/2018   Procedure: AMPUTATION TOES THREE, FOUR AND FIVE on left foot;  Surgeon: Waynetta Sandy, MD;  Location: [place];  Service: Vascular;  Laterality: Left;  . WOUND DEBRIDEMENT Left 06/06/2018   Procedure: DEBRIDEMENT WOUND LEFT FOOT;  Surgeon: Waynetta Sandy, MD;  Location: [place];  Service: Vascular;  Laterality: Left;    Allergies  Allergen Reactions  . Morphine And Related Hives  . Metformin Diarrhea and Other (See Comments)    "Allergic," per [facility]    Outpatient Encounter Medications as of 12/25/2019  Medication Sig  . amLODipine (NORVASC) 10 MG tablet Take 10 mg by mouth daily.  . ARIPiprazole (ABILIFY) 15 MG tablet Take 1 tablet (15 mg total) by mouth at bedtime.  Marland Kitchen aspirin EC 81 MG EC tablet Take 1 tablet (81 mg total) by mouth daily.  Marland Kitchen atorvastatin (LIPITOR) 40 MG tablet Take 1 tablet (40 mg total) by mouth daily at 6 PM.  . bisacodyl (DULCOLAX) 10 MG suppository Place 10 mg rectally daily as needed for mild constipation or moderate constipation (if no relief from Milk of Magnesia).  . cilostazol (PLETAL) 100 MG tablet Take 100 mg by mouth 2 (two) times daily.  . cloNIDine (CATAPRES) 0.3 MG tablet Take 0.3 mg by mouth 2 (two) times daily.  . ferrous sulfate 325 (65 FE) MG tablet Take 1 tablet (325 mg total) by mouth daily with breakfast.  . hydrALAZINE (APRESOLINE) 100 MG tablet Take 100 mg by mouth 3 (three) times daily.  . Insulin Detemir (LEVEMIR) 100 UNIT/ML Pen Inject 15 Units into the skin 2 (two) times daily.  Marland Kitchen lisinopril (ZESTRIL) 40 MG tablet Take 40 mg by mouth daily.  . magnesium hydroxide (MILK OF MAGNESIA) 400 MG/5ML suspension Take 30 mLs by mouth daily as needed for mild constipation.  . Nutritional Supplement LIQD Take 120 mLs by mouth daily. MedPass  . Nutritional Supplements (NUTRITIONAL SUPPLEMENT PO) Take 1 each by mouth daily. Magic Cup  . Sodium Phosphates (RA SALINE ENEMA) 19-7  GM/118ML ENEM Place 1 enema rectally daily as needed (if no relief from Dulcolax suppository for constipation and call MD if no relief from enema).  . [DISCONTINUED] clopidogrel (PLAVIX) 75 MG tablet Take 1 tablet (75 mg total) by mouth daily.   No facility-administered encounter medications on file as of 12/25/2019.    Review of Systems  GENERAL: No change in appetite, no fatigue, no weight changes, no fever, chills or weakness MOUTH and THROAT: Denies oral discomfort, gingival pain or bleeding RESPIRATORY: no cough, SOB, DOE, wheezing, hemoptysis CARDIAC: No chest pain, edema or palpitations GI: No abdominal pain, diarrhea, constipation, heart burn, nausea or vomiting GU: Denies dysuria, frequency, hematuria, incontinence, or discharge NEUROLOGICAL: Denies dizziness, syncope, numbness, or headache PSYCHIATRIC: + Insomnia    Immunization History  Administered Date(s) Administered  . Influenza Inj Mdck Quad Pf 07/16/2019  . Influenza,inj,Quad PF,6+ Mos 05/24/2013, 08/29/2014, 10/08/2015  . Influenza-Unspecified 06/22/2016, 07/01/2018  . PPD Test 11/10/2016  . Pneumococcal Polysaccharide-23 04/18/2012, 10/08/2015, 02/14/2017   Pertinent  Health Maintenance Due  Topic Date Due  . PAP SMEAR-Modifier  Never done  . OPHTHALMOLOGY EXAM  08/13/2014  . FOOT EXAM  10/15/2015  .  INFLUENZA VACCINE  03/28/2020  . HEMOGLOBIN A1C  06/07/2020   Fall Risk  12/07/2014 10/14/2014  Falls in the past year? No No     Vitals:   12/25/19 1445 12/25/19 1446  BP: (!) 168/96 (!) 166/90  Pulse: 80   Resp: 20   Temp: 97.7 F (36.5 C)   TempSrc: Oral   Weight: 186 lb 6.4 oz (84.6 kg)   Height: 5\' 1"  (1.549 m)    Body mass index is 35.22 kg/m.  Physical Exam  GENERAL APPEARANCE: Well nourished. In no acute distress. Obese SKIN:  Skin is warm and dry.  MOUTH and THROAT: Lips are without lesions. Oral mucosa is moist and without lesions. Tongue is normal in shape, size, and color and without  lesions RESPIRATORY: Breathing is even & unlabored, BS CTAB CARDIAC: RRR, no murmur,no extra heart sounds, no edema GI: Abdomen soft, normal BS, no masses, no tenderness NEUROLOGICAL: There is no tremor. Has a wet voice. Left-sided weakness. PSYCHIATRIC:  Affect and behavior are appropriate  Labs reviewed: Recent Labs    12/08/19 0342 12/08/19 0342 12/09/19 0332 12/10/19 0500 12/15/19 2043  NA 142   < > 141 142 140  K 3.2*   < > 3.8 3.7 3.5  CL 107   < > 110 110 107  CO2 23   < > 21* 23 22  GLUCOSE 88   < > 100* 85 132*  BUN 14   < > 12 12 15   CREATININE 1.44*   < > 1.36* 1.48* 1.26*  CALCIUM 8.9   < > 9.0 9.3 9.1  MG 2.0  --  2.1 2.2  --    < > = values in this interval not displayed.   Recent Labs    06/26/19 1638 12/06/19 1728 12/08/19 0342  AST 14* 17 12*  ALT 11 8 8   ALKPHOS 60 49 51  BILITOT 0.9 0.4 1.0  PROT 7.5 7.0 6.6  ALBUMIN 3.5 3.0* 3.1*   Recent Labs    12/08/19 0342 12/08/19 0342 12/09/19 0332 12/10/19 0500 12/15/19 2043  WBC 6.1   < > 7.0 7.3 5.4  NEUTROABS 2.4  --  3.1 2.6  --   HGB 10.9*   < > 11.7* 11.8* 11.6*  HCT 33.9*   < > 37.4 37.6 37.3  MCV 73.5*   < > 73.0* 74.3* 73.9*  PLT 238   < > 263 251 222   < > = values in this interval not displayed.   Lab Results  Component Value Date   TSH 0.705 04/02/2018   Lab Results  Component Value Date   HGBA1C 6.7 (H) 12/07/2019   Lab Results  Component Value Date   CHOL 158 12/07/2019   HDL 45 12/07/2019   LDLCALC 95 12/07/2019   TRIG 89 12/07/2019   CHOLHDL 3.5 12/07/2019    Significant Diagnostic Results in last 30 days:  MR ANGIO HEAD WO CONTRAST  Result Date: 12/06/2019 CLINICAL DATA:  49 year old female with acute on chronic small vessel disease. EXAM: MRA HEAD WITHOUT CONTRAST TECHNIQUE: Angiographic images of the Circle of Willis were obtained using MRA technique without intravenous contrast. COMPARISON:  Brain MRI today reported separately. Intracranial MRA 09/02/2018.  FINDINGS: Chronically diminutive posterior circulation. Antegrade flow appears stable in diminutive distal vertebral arteries, the left terminates in PICA. The right PICA origin remains patent. The right vertebral supplies a patent but diminutive basilar artery. There is moderate to severe stenosis at the basilar artery origin which may  be progressed from last year (series 1038, image 7). Fetal type bilateral PCA origins. The basilar terminates in the SCA is which remain patent. Bilateral fetal type PCA origins. Severe right PCA P1 and left PCA P2 stenoses are redemonstrated and stable. Stable antegrade flow in both ICA siphons. Normal posterior communicating artery origins. Mild to moderate bilateral siphon anterior genu/supraclinoid irregularity appears stable with mild stenosis. Carotid termini remain patent. MCA and ACA origins remain patent but there is new moderate stenosis at the left A1 origin (series 1030, image 10). MCA M1 segments and bifurcations remain patent with mild irregularity. Visible bilateral MCA and ACA branches are stable with mild irregularity. IMPRESSION: 1. Chronically age advanced intracranial atherosclerosis. Negative for large vessel occlusion. 2. Diminutive vertebrobasilar system on the basis of fetal type PCA origins, but evidence of hemodynamically significant stenosis at the Vertebrobasilar Junction which appears progressed from last year. Chronic severe stenoses of the Right P1 and Left P2. 3. Stable mild stenoses of the bilateral ICA anterior genu. New moderate stenosis of the Left ACA origin. Electronically Signed   By: Genevie Ann M.D.   On: 12/06/2019 19:17   MR BRAIN WO CONTRAST  Result Date: 12/06/2019 CLINICAL DATA:  Code stroke. 49 year old female with slurred speech. History of prior stroke. EXAM: MRI HEAD WITHOUT CONTRAST TECHNIQUE: Multiplanar, multiecho pulse sequences of the brain and surrounding structures were obtained without intravenous contrast. COMPARISON:  Head  CT earlier today. Brain MRI 01/14/2018. FINDINGS: Brain: Oval 10 mm focus of restricted diffusion in the left corona radiata tracking into the posterior left lentiform (series 5, image 73). Faint T2 and FLAIR hyperintensity. No hemorrhage or mass effect. No other restricted diffusion. Small chronic infarct in the deep left cerebellar nuclei is new since 2019. Extensive chronic small vessel infarcts scattered in the bilateral basal ganglia, thalami, right corona radiata. There is Wallerian degeneration in the right brainstem. Scattered chronic microhemorrhages, with abundant hemosiderin in the posterior right lentiform. Additional scattered and confluent bilateral white matter T2 and FLAIR hyperintensity. No definite cortical encephalomalacia. No midline shift, mass effect, evidence of mass lesion, ventriculomegaly, extra-axial collection or acute intracranial hemorrhage. Cervicomedullary junction and pituitary are within normal limits. Vascular: Major intracranial vascular flow voids are stable since 2019. Skull and upper cervical spine: Negative visible cervical spine and bone marrow signal. Sinuses/Orbits: Negative orbits. Paranasal Visualized paranasal sinuses and mastoids are stable and well pneumatized. Other: Visible internal auditory structures appear normal. Scalp and face soft tissues appear negative. IMPRESSION: 1. Small acute lacunar infarct in the left corona radiata tracking into the posterior left lentiform. No associated hemorrhage or mass effect. 2. Underlying very severe chronic small vessel disease. 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 Electronically Signed   By: Genevie Ann M.D.   On: 12/06/2019 19:11   DG Chest Port 1 View  Result Date: 12/15/2019 CLINICAL DATA:  49 year old female with cough. EXAM: PORTABLE CHEST 1 VIEW COMPARISON:  Chest radiograph dated 09/12/2018. FINDINGS: Stable cardiomegaly. There are bibasilar atelectatic changes. No focal consolidation, pleural  effusion, pneumothorax. Atherosclerotic calcification of the aorta. No acute osseous pathology. IMPRESSION: No active disease. Electronically Signed   By: Anner Crete M.D.   On: 12/15/2019 21:08   DG Swallowing Func-Speech Pathology  Result Date: 12/08/2019 Objective Swallowing Evaluation: Type of Study: MBS-Modified Barium Swallow Study  Patient Details Name: Rubab Laudano MRN: [id number] Date of Birth: 1971/01/09 Today's Date: 12/08/2019 Time: SLP Start Time (ACUTE ONLY): 0805 -SLP Stop Time (ACUTE ONLY): 0826 SLP Time Calculation (  min) (ACUTE ONLY): 21 min Past Medical History: Past Medical History: Diagnosis Date . Anxiety  . Chronic lower back pain 04/17/2012  "just got over some; catched when I walked" . COPD (chronic obstructive pulmonary disease) ([place])  . Critical lower limb ischemia 05/27/2018 . Depression  . Headache(784.0) 04/17/2012  "~ qod; lately waking up in am w/one" . High cholesterol  . Hypertension  . Migraines 04/17/2012 . Obesity  . Sleep apnea   CPAP . Stroke ([place])  . Type II diabetes mellitus ([place]) 08/28/2002 Past Surgical History: Past Surgical History: Procedure Laterality Date . ABDOMINAL AORTOGRAM W/LOWER EXTREMITY Left 05/27/2018  Procedure: ABDOMINAL AORTOGRAM W/LOWER EXTREMITY Runoff and Possible Intervention;  Surgeon: Waynetta Sandy, MD;  Location: [place] [facility];  Service: Cardiovascular;  Laterality: Left; . APPLICATION OF WOUND VAC Left 0000000  Procedure: APPLICATION OF WOUND VAC;  Surgeon: Waynetta Sandy, MD;  Location: [place];  Service: Vascular;  Laterality: Left; . BIOPSY  04/04/2018  Procedure: BIOPSY;  Surgeon: [name], MD;  Location: [facility] ENDOSCOPY;  Service: Endoscopy;; . CARDIAC CATHETERIZATION  ~ 2011 . COLONOSCOPY N/A 04/04/2018  Procedure: COLONOSCOPY;  Surgeon: [name], MD;  Location: [facility] ENDOSCOPY;  Service: Endoscopy;  Laterality: N/A; . LOWER EXTREMITY ANGIOGRAM Left 05/27/2018 . PERIPHERAL VASCULAR INTERVENTION Left 05/27/2018   Procedure: PERIPHERAL VASCULAR INTERVENTION;  Surgeon: Waynetta Sandy, MD;  Location: [place] [facility] LAB;  Service: Cardiovascular;  Laterality: Left; . POLYPECTOMY  04/04/2018  Procedure: POLYPECTOMY;  Surgeon: [name], MD;  Location: [facility] ENDOSCOPY;  Service: Endoscopy;; . TRANSMETATARSAL AMPUTATION Left 05/28/2018  Procedure: AMPUTATION TOES THREE, FOUR AND FIVE on left foot;  Surgeon: Waynetta Sandy, MD;  Location: [place];  Service: Vascular;  Laterality: Left; . WOUND DEBRIDEMENT Left 06/06/2018  Procedure: DEBRIDEMENT WOUND LEFT FOOT;  Surgeon: Waynetta Sandy, MD;  Location: [facility] OR;  Service: Vascular;  Laterality: Left; HPI: 49 year old female with history of hemorrhagic stroke in 10/07/2018 with residual left sided weakness, COPD, hypertension, hyperlipidemia, diabetes mellitus type 2, OSA on CPAP, anxiety, depression, substance abuse (marijuana, crack cocaine) presented with slurred speech. MRI revealed acute small lacunar infarct in left corona radiata. BSE 1/21020 rec'd Dys 3, thin. MBS 09/18/18 oropharyngeal swallow WFL. Espohageal retention mid esophagus and w/u rec'd  Subjective: alert, cooperative Assessment / Plan / Recommendation CHL IP CLINICAL IMPRESSIONS 12/08/2019 Clinical Impression  Patient presents with mild oropharyngeal dysphagia. Oral phase is remarkable for decreased bolus cohesion, premature spillage to the vallecula and pyriform sinuses and lingual residue. When trialing a regular solid, the pt stated she was going to swallow the bolus before attempting to masticate, requiring Min cues to continue to fully masticate prior to swallowing. Pharyngeal phase is remarkable for delayed swallow initiation at the pyriform sinuses and reduced laryngeal closure.  Patient's vallecular space was also noted to be small, making it difficult for the bolus to collect there prior to the swallow. Liquids would either spill over into the larynx or to the pyriform sinsues. The  small vallecular space and reduced laryngeal closure ultimately resulted in penetration/aspiration. Aspiration (PAS 7 or 8) occurred during the swallow with thin liquids. As the pt was challenged by taking large consecutive sips, she aspirated, spit out what was remaining in her oral cavity and was noted with suspected nasopharyngeal regurgitation (nasal secretions and what appeared to be some barium expelled from her nasal cavity). The fluoro was not on during suspected nasopharyngeal regurgitation, therefore cannot confirm. A chin tuck was trialed to attempt to increase  the space in the vallecula, however, this was unsuccessful. Recommend Dys 2 diet, nectar thick liquids, meds crushed in puree, with full supervision. Ensure the pt is taking small, single sips at a time and is following general aspiration precautions.  SLP Visit Diagnosis Dysphagia, oropharyngeal phase (R13.12) Attention and concentration deficit following -- Frontal lobe and executive function deficit following -- Impact on safety and function Moderate aspiration risk   CHL IP TREATMENT RECOMMENDATION 12/08/2019 Treatment Recommendations Therapy as outlined in treatment plan below   Prognosis 12/08/2019 Prognosis for Safe Diet Advancement Good Barriers to Reach Goals -- Barriers/Prognosis Comment -- CHL IP DIET RECOMMENDATION 12/08/2019 SLP Diet Recommendations Dysphagia 2 (Fine chop) solids;Nectar thick liquid Liquid Administration via Cup;Straw Medication Administration Crushed with puree Compensations Minimize environmental distractions;Slow rate;Small sips/bites Postural Changes Remain semi-upright after after feeds/meals (Comment);Seated upright at 90 degrees   CHL IP OTHER RECOMMENDATIONS 12/08/2019 Recommended Consults -- Oral Care Recommendations Oral care BID Other Recommendations Prohibited food (jello, ice cream, thin soups);Order thickener from pharmacy;Remove water pitcher   CHL IP FOLLOW UP RECOMMENDATIONS 12/08/2019 Follow up  Recommendations Skilled Nursing facility   [facility] IP FREQUENCY AND DURATION 12/08/2019 Speech Therapy Frequency (ACUTE ONLY) min 2x/week Treatment Duration 2 weeks      CHL IP ORAL PHASE 12/08/2019 Oral Phase Impaired Oral - Pudding Teaspoon -- Oral - Pudding Cup -- Oral - Honey Teaspoon -- Oral - Honey Cup -- Oral - Nectar Teaspoon -- Oral - Nectar Cup Lingual/palatal residue Oral - Nectar Straw Lingual/palatal residue Oral - Thin Teaspoon -- Oral - Thin Cup Decreased bolus cohesion;Premature spillage Oral - Thin Straw Decreased bolus cohesion;Premature spillage Oral - Puree WFL Oral - Mech Soft -- Oral - Regular Delayed oral transit Oral - Multi-Consistency -- Oral - Pill -- Oral Phase - Comment --  CHL IP PHARYNGEAL PHASE 12/08/2019 Pharyngeal Phase Impaired Pharyngeal- Pudding Teaspoon -- Pharyngeal -- Pharyngeal- Pudding Cup -- Pharyngeal -- Pharyngeal- Honey Teaspoon -- Pharyngeal -- Pharyngeal- Honey Cup -- Pharyngeal -- Pharyngeal- Nectar Teaspoon -- Pharyngeal -- Pharyngeal- Nectar Cup Penetration/Aspiration during swallow;Reduced airway/laryngeal closure Pharyngeal Material enters airway, remains ABOVE vocal cords then ejected out Pharyngeal- Nectar Straw Penetration/Aspiration during swallow;Reduced airway/laryngeal closure Pharyngeal Material enters airway, remains ABOVE vocal cords then ejected out Pharyngeal- Thin Teaspoon -- Pharyngeal -- Pharyngeal- Thin Cup Penetration/Aspiration during swallow;Trace aspiration;Delayed swallow initiation-pyriform sinuses;Compensatory strategies attempted (with notebox);Other (Comment);Reduced airway/laryngeal closure Pharyngeal Material enters airway, passes BELOW cords without attempt by patient to eject out (silent aspiration) Pharyngeal- Thin Straw Penetration/Aspiration during swallow;Trace aspiration;Delayed swallow initiation-pyriform sinuses;Compensatory strategies attempted (with notebox);Other (Comment);Reduced airway/laryngeal closure Pharyngeal Material enters  airway, passes BELOW cords without attempt by patient to eject out (silent aspiration) Pharyngeal- Puree WFL Pharyngeal -- Pharyngeal- Mechanical Soft -- Pharyngeal -- Pharyngeal- Regular WFL Pharyngeal -- Pharyngeal- Multi-consistency -- Pharyngeal -- Pharyngeal- Pill -- Pharyngeal -- Pharyngeal Comment --  CHL IP CERVICAL ESOPHAGEAL PHASE 12/08/2019 Cervical Esophageal Phase WFL Pudding Teaspoon -- Pudding Cup -- Honey Teaspoon -- Honey Cup -- Nectar Teaspoon -- Nectar Cup -- Nectar Straw -- Thin Teaspoon -- Thin Cup -- Thin Straw -- Puree -- Mechanical Soft -- Regular -- Multi-consistency -- Pill -- Cervical Esophageal Comment -- Osie Bond., M.A. [place] [facility] Pager [phone] Office [phone] 12/08/2019, 9:45 AM              ECHOCARDIOGRAM COMPLETE  Result Date: 12/07/2019    ECHOCARDIOGRAM REPORT   Patient Name:   ELANOR HORACEK Date of Exam: 12/07/2019 Medical Rec #:  [id number]  Height:       61.0 in Accession #:    [id number]        Weight:       195.0 lb Date of Birth:  Jul 23, 1971          BSA:          1.869 m Patient Age:    27 years          BP:           168/88 mmHg Patient Gender: F                 HR:           50 bpm. Exam Location:  Inpatient Procedure: 2D Echo Indications:    Stroke 434.91 / I163.9  History:        Patient has prior history of Echocardiogram examinations, most                 recent 06/24/2018. Signs/Symptoms:Dyspnea; Risk Factors:Tobacco                 abuse, Hypertension, Diabetes and Dyslipidemia. Cocaine abuse                 Acute renal failure                 Aortic valve insufficiency.  Sonographer:    Vikki Ports Turrentine Referring Phys: [id number] [place]  1. Left ventricular ejection fraction, by estimation, is 65 to 70%. The left ventricle has normal function. The left ventricle has no regional wall motion abnormalities. There is moderate concentric left ventricular hypertrophy. Left ventricular diastolic parameters  are consistent with Grade I diastolic dysfunction (impaired relaxation). Elevated left ventricular end-diastolic pressure.  2. Right ventricular systolic function is normal. The right ventricular size is normal. Tricuspid regurgitation signal is inadequate for assessing PA pressure.  3. The mitral valve is normal in structure. Trivial mitral valve regurgitation. No evidence of mitral stenosis.  4. The aortic valve is tricuspid. Aortic valve regurgitation is mild to mdoerate. Aortic valve mean gradient measures 9.0 mmHg. Aortic valve peak gradient measures 16.4 mmHg. Aortic valve area, by VTI measures 1.53 cm. Very mild aortic stenosis.  5. The inferior vena cava is normal in size with greater than 50% respiratory variability, suggesting right atrial pressure of 3 mmHg.  6. Left atrial size was moderately dilated. FINDINGS  Left Ventricle: Left ventricular ejection fraction, by estimation, is 65 to 70%. The left ventricle has normal function. The left ventricle has no regional wall motion abnormalities. The left ventricular internal cavity size was normal in size. There is  moderate concentric left ventricular hypertrophy. Left ventricular diastolic parameters are consistent with Grade I diastolic dysfunction (impaired relaxation). Elevated left ventricular end-diastolic pressure. Right Ventricle: The right ventricular size is normal. No increase in right ventricular wall thickness. Right ventricular systolic function is normal. Tricuspid regurgitation signal is inadequate for assessing PA pressure. Left Atrium: Left atrial size was moderately dilated. Right Atrium: Right atrial size was normal in size. Pericardium: There is no evidence of pericardial effusion. Mitral Valve: The mitral valve is normal in structure. Normal mobility of the mitral valve leaflets. Trivial mitral valve regurgitation. No evidence of mitral valve stenosis. Tricuspid Valve: The tricuspid valve is normal in structure. Tricuspid valve  regurgitation is trivial. No evidence of tricuspid stenosis. Aortic Valve: The aortic valve is tricuspid. Aortic valve regurgitation is mild to moderate. Aortic regurgitation PHT measures 944 msec. There is moderate calcification  of the aortic valve. Aortic valve mean gradient measures 9.0 mmHg. Aortic valve peak gradient measures 16.4 mmHg. Aortic valve area, by VTI measures 1.53 cm. Very mild aortic stenosis. Pulmonic Valve: The pulmonic valve was normal in structure. Pulmonic valve regurgitation is not visualized. No evidence of pulmonic stenosis. Aorta: The aortic root is normal in size and structure. Venous: The inferior vena cava is normal in size with greater than 50% respiratory variability, suggesting right atrial pressure of 3 mmHg. IAS/Shunts: No atrial level shunt detected by color flow Doppler.  LEFT VENTRICLE PLAX 2D LVIDd:         3.60 cm  Diastology LVIDs:         2.20 cm  LV e' lateral:   4.03 cm/s LV PW:         1.40 cm  LV E/e' lateral: 19.3 LV IVS:        1.40 cm  LV e' medial:    5.22 cm/s LVOT diam:     1.70 cm  LV E/e' medial:  14.9 LV SV:         64 LV SV Index:   34 LVOT Area:     2.27 cm  RIGHT VENTRICLE RV S prime:     15.30 cm/s TAPSE (M-mode): 2.7 cm LEFT ATRIUM             Index       RIGHT ATRIUM           Index LA diam:        4.50 cm 2.41 cm/m  RA Area:     19.90 cm LA Vol (A2C):   91.5 ml 48.97 ml/m RA Volume:   51.10 ml  27.35 ml/m LA Vol (A4C):   75.3 ml 40.30 ml/m LA Biplane Vol: 85.9 ml 45.97 ml/m  AORTIC VALVE AV Area (Vmax):    1.48 cm AV Area (Vmean):   1.46 cm AV Area (VTI):     1.53 cm AV Vmax:           202.50 cm/s AV Vmean:          141.500 cm/s AV VTI:            0.416 m AV Peak Grad:      16.4 mmHg AV Mean Grad:      9.0 mmHg LVOT Vmax:         132.00 cm/s LVOT Vmean:        91.000 cm/s LVOT VTI:          0.280 m LVOT/AV VTI ratio: 0.67 AI PHT:            944 msec  AORTA Ao Root diam: 2.80 cm MITRAL VALVE MV Area (PHT): 2.48 cm     SHUNTS MV Decel Time: 306  msec     Systemic VTI:  0.28 m MV E velocity: 77.70 cm/s   Systemic Diam: 1.70 cm MV A velocity: 114.00 cm/s MV E/A ratio:  0.68 Fransico Him MD Electronically signed by Fransico Him MD Signature Date/Time: 12/07/2019/2:07:10 PM    Final    CT HEAD CODE STROKE WO CONTRAST  Result Date: 12/06/2019 CLINICAL DATA:  Code stroke. 49 year old female with slurred speech. History of prior stroke. EXAM: CT HEAD WITHOUT CONTRAST TECHNIQUE: Contiguous axial images were obtained from the base of the skull through the vertex without intravenous contrast. COMPARISON:  Head CT 06/26/2019. Brain MRI 09/02/2018. FINDINGS: Brain: Advanced chronic cerebral white matter and deep gray nuclei hypodensity. No acute intracranial  hemorrhage identified. No midline shift, mass effect, or evidence of intracranial mass lesion. No ventriculomegaly. Small chronic infarct in the left cerebellum. No cortically based acute infarct identified. Vascular: Calcified atherosclerosis at the skull base. No suspicious intracranial vascular hyperdensity. Skull: Stable hyperostosis. No acute osseous abnormality identified. Sinuses/Orbits: Visualized paranasal sinuses and mastoids are stable and well pneumatized. Other: Leftward gaze. Negative scalp soft tissues. ASPECTS [facility] Stroke Program Early CT Score) Total score (0-10 with 10 being normal): 10 IMPRESSION: 1. Stable CT appearance of advanced chronic small vessel disease since October. ASPECTS 10. 2. These results were communicated to Dr. Lorraine Lax at 5:37 pmon 4/10/2021by text page via the [facility] messaging system. Electronically Signed   By: Genevie Ann M.D.   On: 12/06/2019 17:38   VAS US CAROTID  Result Date: 12/08/2019 Carotid Arterial Duplex Study Indications:       CVA and Speech disturbance. Risk Factors:      Hypertension, hyperlipidemia, Diabetes, prior CVA. Other Factors:     Substance abuse. Comparison Study:  No prior study on file Performing Technologist: Sharion Dove RVS  Examination  Guidelines: A complete evaluation includes B-mode imaging, spectral Doppler, color Doppler, and power Doppler as needed of all accessible portions of each vessel. Bilateral testing is considered an integral part of a complete examination. Limited examinations for reoccurring indications may be performed as noted.  Right Carotid Findings: +----------+--------+--------+--------+------------------+------------------+           PSV cm/sEDV cm/sStenosisPlaque DescriptionComments           +----------+--------+--------+--------+------------------+------------------+ CCA Prox  1       0                                 intimal thickening +----------+--------+--------+--------+------------------+------------------+ CCA Distal1       0                                 intimal thickening +----------+--------+--------+--------+------------------+------------------+ ICA Prox  1       0               heterogenous      tortuous           +----------+--------+--------+--------+------------------+------------------+ ICA Distal1       0                                 tortuous           +----------+--------+--------+--------+------------------+------------------+ ECA       1       0                                                    +----------+--------+--------+--------+------------------+------------------+ +----------+--------+-------+--------+-------------------+           PSV cm/sEDV cmsDescribeArm Pressure (mmHG) +----------+--------+-------+--------+-------------------+ Subclavian1                                          +----------+--------+-------+--------+-------------------+ +---------+--------+-+--------+-+ VertebralPSV cm/s0EDV cm/s0 +---------+--------+-+--------+-+  Left Carotid Findings: +----------+--------+--------+--------+------------------+------------------+           PSV cm/sEDV cm/sStenosisPlaque DescriptionComments            +----------+--------+--------+--------+------------------+------------------+  CCA Prox  1       0                                 intimal thickening +----------+--------+--------+--------+------------------+------------------+ CCA Distal1       0                                 intimal thickening +----------+--------+--------+--------+------------------+------------------+ ICA Prox  1       0                                                    +----------+--------+--------+--------+------------------+------------------+ ICA Distal1       0                                 tortuous           +----------+--------+--------+--------+------------------+------------------+ ECA       1       0                                                    +----------+--------+--------+--------+------------------+------------------+ +----------+--------+--------+--------+-------------------+           PSV cm/sEDV cm/sDescribeArm Pressure (mmHG) +----------+--------+--------+--------+-------------------+ [id number]                                           +----------+--------+--------+--------+-------------------+ +---------+--------+-+--------+-+ VertebralPSV cm/s0EDV cm/s0 +---------+--------+-+--------+-+   Summary: Right Carotid: The extracranial vessels were near-normal with only minimal wall                thickening or plaque. Left Carotid: The extracranial vessels were near-normal with only minimal wall               thickening or plaque. Vertebrals:  Bilateral vertebral arteries demonstrate antegrade flow. Subclavians: Normal flow hemodynamics were seen in bilateral subclavian              arteries. *See table(s) above for measurements and observations.  Electronically signed by Antony Contras MD on 12/08/2019 at 1:55:53 PM.    Final     Assessment/Plan  1. Uncontrolled hypertension -  BPs still elevated, will start Metoprolol 25 mg 1/2 tab = 12.5 mg BID, continue  Lisinopril, Clonidine, Amlodipine and Hydralazine  2. Primary insomnia - start Melatonin 5 mg at bedtime  3. DM (diabetes mellitus) with peripheral vascular complication (HCC) Lab Results  Component Value Date   HGBA1C 6.7 (H) 12/07/2019   -  Continue Levemir, Cilostazol - check CBGs  4. Schizoaffective disorder, bipolar type ([place]) - no reported agitation, continue Aripiprazole - followed by psych NP    Family/ staff Communication:  Discussed plan of care with resident and charge nurse.  Labs/tests ordered:  None  Goals of care:   Short-term care     [name], DNP, FNP-BC [facility] [phone] (Monday-Friday 8:00 a.m. - 5:00 p.m.) [phone] (after hours)

## 2019-12-29 ENCOUNTER — Encounter: Payer: Self-pay | Admitting: Adult Health

## 2019-12-29 ENCOUNTER — Non-Acute Institutional Stay (SKILLED_NURSING_FACILITY): Payer: Medicare Other | Admitting: Adult Health

## 2019-12-29 DIAGNOSIS — F321 Major depressive disorder, single episode, moderate: Secondary | ICD-10-CM | POA: Diagnosis not present

## 2019-12-29 DIAGNOSIS — F5101 Primary insomnia: Secondary | ICD-10-CM

## 2019-12-29 DIAGNOSIS — K5901 Slow transit constipation: Secondary | ICD-10-CM | POA: Diagnosis not present

## 2019-12-29 NOTE — Progress Notes (Signed)
Location:  [place] Room Number: 217-A Place of Service:  SNF (31) Provider:  [name], DNP, FNP-BC  Patient Care Team: [name], MD as PCP - General (Internal Medicine) Minus Breeding, MD as PCP - Cardiology (Cardiology)  Extended Emergency Contact Information Primary Emergency Contact: [name], [place] Phone: [phone] Relation: Brother Secondary Emergency Contact: [name] Phone: [phone] Relation: Other  Code Status:  Full Code  Goals of care: Advanced Directive information Advanced Directives 12/15/2019  Does Patient Have a Medical Advance Directive? No  Would patient like information on creating a medical advance directive? No - Guardian declined  Some encounter information is confidential and restricted. Go to Review Flowsheets activity to see all data.     Chief Complaint  Patient presents with  . Acute Visit    Patient is seen for depression and constipation    HPI:  Pt is a 49 y.o. female seen today for an acute visit for complaints of depression and constipation. She complains of not sleeping at night and feeling sad. Melatonin was recently ordered but she said that it doesn't work for her. She said that she used to take Remeron for depression. When asked if she has thoughts of harming herself, she replied,"No." She, also, complains of constipation. She was given MOM but was not effective. She is a short-term care resident of [facility]. She has a PMH of COPD, OSA, IDDM with vascular complications, headaches, essential hypertension, ischemic PVD and polysubstance abuse.   Past Medical History:  Diagnosis Date  . Anxiety   . Chronic lower back pain 04/17/2012   "just got over some; catched when I walked"  . COPD (chronic obstructive pulmonary disease) ([place])   . Critical lower limb ischemia 05/27/2018  . Depression   .  Headache(784.0) 04/17/2012   "~ qod; lately waking up in am w/one"  . High cholesterol   . Hypertension   . Migraines 04/17/2012  . Obesity   . Sleep apnea    CPAP  . Stroke ([place])   . Type II diabetes mellitus ([place]) 08/28/2002   Past Surgical History:  Procedure Laterality Date  . ABDOMINAL AORTOGRAM W/LOWER EXTREMITY Left 05/27/2018   Procedure: ABDOMINAL AORTOGRAM W/LOWER EXTREMITY Runoff and Possible Intervention;  Surgeon: Waynetta Sandy, MD;  Location: [place] CV LAB;  Service: Cardiovascular;  Laterality: Left;  . APPLICATION OF WOUND VAC Left 06/06/2018   Procedure: APPLICATION OF WOUND VAC;  Surgeon: Waynetta Sandy, MD;  Location: [place];  Service: Vascular;  Laterality: Left;  . BIOPSY  04/04/2018   Procedure: BIOPSY;  Surgeon: [name], MD;  Location: [facility] ENDOSCOPY;  Service: Endoscopy;;  . CARDIAC CATHETERIZATION  ~ 2011  . COLONOSCOPY N/A 04/04/2018   Procedure: COLONOSCOPY;  Surgeon: [name], MD;  Location: [facility] ENDOSCOPY;  Service: Endoscopy;  Laterality: N/A;  . LOWER EXTREMITY ANGIOGRAM Left 05/27/2018  . PERIPHERAL VASCULAR INTERVENTION Left 05/27/2018   Procedure: PERIPHERAL VASCULAR INTERVENTION;  Surgeon: Waynetta Sandy, MD;  Location: [place] CV LAB;  Service: Cardiovascular;  Laterality: Left;  . POLYPECTOMY  04/04/2018   Procedure: POLYPECTOMY;  Surgeon: [name], MD;  Location: [facility] ENDOSCOPY;  Service: Endoscopy;;  . TRANSMETATARSAL AMPUTATION Left 05/28/2018   Procedure: AMPUTATION TOES THREE, FOUR AND FIVE on left foot;  Surgeon: Waynetta Sandy, MD;  Location: [place];  Service: Vascular;  Laterality: Left;  .  WOUND DEBRIDEMENT Left 06/06/2018   Procedure: DEBRIDEMENT WOUND LEFT FOOT;  Surgeon: Waynetta Sandy, MD;  Location: [place];  Service: Vascular;  Laterality: Left;    Allergies  Allergen Reactions  . Morphine And Related Hives  . Metformin Diarrhea and Other (See Comments)    "Allergic," per  [facility]    Outpatient Encounter Medications as of 12/29/2019  Medication Sig  . amLODipine (NORVASC) 10 MG tablet Take 10 mg by mouth daily.  . ARIPiprazole (ABILIFY) 15 MG tablet Take 1 tablet (15 mg total) by mouth at bedtime.  Marland Kitchen aspirin EC 81 MG EC tablet Take 1 tablet (81 mg total) by mouth daily.  Marland Kitchen atorvastatin (LIPITOR) 40 MG tablet Take 1 tablet (40 mg total) by mouth daily at 6 PM.  . bisacodyl (DULCOLAX) 10 MG suppository Place 10 mg rectally daily as needed for mild constipation or moderate constipation (if no relief from Milk of Magnesia).  . cilostazol (PLETAL) 100 MG tablet Take 100 mg by mouth 2 (two) times daily.  . cloNIDine (CATAPRES) 0.3 MG tablet Take 0.3 mg by mouth 2 (two) times daily.  . ferrous sulfate 325 (65 FE) MG tablet Take 1 tablet (325 mg total) by mouth daily with breakfast.  . hydrALAZINE (APRESOLINE) 100 MG tablet Take 100 mg by mouth 3 (three) times daily.  . Insulin Detemir (LEVEMIR) 100 UNIT/ML Pen Inject 15 Units into the skin 2 (two) times daily.  Marland Kitchen lisinopril (ZESTRIL) 40 MG tablet Take 40 mg by mouth daily.  . magnesium hydroxide (MILK OF MAGNESIA) 400 MG/5ML suspension Take 30 mLs by mouth daily as needed for mild constipation.  . melatonin 5 MG TABS Take 5 mg by mouth at bedtime.  . metoprolol tartrate (LOPRESSOR) 25 MG tablet Take 12.5 mg by mouth 2 (two) times daily.  . Nutritional Supplement LIQD Take 120 mLs by mouth daily. MedPass  . Nutritional Supplements (NUTRITIONAL SUPPLEMENT PO) Take 1 each by mouth daily. Magic Cup  . Sodium Phosphates (RA SALINE ENEMA) 19-7 GM/118ML ENEM Place 1 enema rectally daily as needed (if no relief from Dulcolax suppository for constipation and call MD if no relief from enema).   No facility-administered encounter medications on file as of 12/29/2019.    Review of Systems  GENERAL: No change in appetite, no fatigue, no weight changes, no fever, chills or weakness MOUTH and THROAT: Denies oral discomfort, gingival  pain or bleeding RESPIRATORY: no cough, SOB, DOE, wheezing, hemoptysis CARDIAC: No chest pain, edema or palpitations GI: +constipation GU: Denies dysuria, frequency, hematuria, incontinence, or discharge NEUROLOGICAL: Denies dizziness, syncope, numbness, or headache PSYCHIATRIC: +insomnia   Immunization History  Administered Date(s) Administered  . Influenza Inj Mdck Quad Pf 07/16/2019  . Influenza,inj,Quad PF,6+ Mos 05/24/2013, 08/29/2014, 10/08/2015  . Influenza-Unspecified 06/22/2016, 07/01/2018  . PPD Test 11/10/2016  . Pneumococcal Polysaccharide-23 04/18/2012, 10/08/2015, 02/14/2017   Pertinent  Health Maintenance Due  Topic Date Due  . PAP SMEAR-Modifier  Never done  . OPHTHALMOLOGY EXAM  08/13/2014  . FOOT EXAM  10/15/2015  . INFLUENZA VACCINE  03/28/2020  . HEMOGLOBIN A1C  06/07/2020   Fall Risk  12/07/2014 10/14/2014  Falls in the past year? No No     Vitals:   12/29/19 1546  BP: 101/62  Pulse: 74  Resp: 20  Temp: (!) 97.5 F (36.4 C)  TempSrc: Oral  SpO2: 96%  Weight: 186 lb 6.4 oz (84.6 kg)  Height: 5\' 1"  (1.549 m)   Body mass index is 35.22 kg/m.  Physical  Exam  GENERAL APPEARANCE: Well nourished. In no acute distress. Obese SKIN:  Skin is warm and dry.  MOUTH and THROAT: Lips are without lesions. Oral mucosa is moist and without lesions. Tongue is normal in shape, size, and color and without lesions RESPIRATORY: Breathing is even & unlabored, BS CTAB CARDIAC: RRR, no murmur,no extra heart sounds, no edema GI: Abdomen soft, normal BS, no masses, no tenderness NEUROLOGICAL: There is no tremor. Wet voice. Left-sided weakness. PSYCHIATRIC:  Affect and behavior are appropriate  Labs reviewed: Recent Labs    12/08/19 0342 12/08/19 0342 12/09/19 0332 12/09/19 0332 12/10/19 0500 12/15/19 2043 12/17/19 0000  NA 142   < > 141   < > 142 140 141  K 3.2*   < > 3.8   < > 3.7 3.5 4.2  CL 107   < > 110   < > 110 107 107  CO2 23   < > 21*   < > 23 22 22    GLUCOSE 88   < > 100*  --  85 132*  --   BUN 14   < > 12   < > 12 15 14   CREATININE 1.44*   < > 1.36*   < > 1.48* 1.26* 1.1  CALCIUM 8.9   < > 9.0   < > 9.3 9.1 9.0  MG 2.0  --  2.1  --  2.2  --   --    < > = values in this interval not displayed.   Recent Labs    06/26/19 1638 12/06/19 1728 12/08/19 0342  AST 14* 17 12*  ALT 11 8 8   ALKPHOS 60 49 51  BILITOT 0.9 0.4 1.0  PROT 7.5 7.0 6.6  ALBUMIN 3.5 3.0* 3.1*   Recent Labs    12/09/19 0332 12/09/19 0332 12/10/19 0500 12/15/19 2043 12/17/19 0000  WBC 7.0   < > 7.3 5.4 5.9  NEUTROABS 3.1  --  2.6  --  2  HGB 11.7*   < > 11.8* 11.6* 10.8*  HCT 37.4   < > 37.6 37.3 34*  MCV 73.0*  --  74.3* 73.9*  --   PLT 263   < > 251 222 232   < > = values in this interval not displayed.   Lab Results  Component Value Date   TSH 0.705 04/02/2018   Lab Results  Component Value Date   HGBA1C 6.7 (H) 12/07/2019   Lab Results  Component Value Date   CHOL 158 12/07/2019   HDL 45 12/07/2019   LDLCALC 95 12/07/2019   TRIG 89 12/07/2019   CHOLHDL 3.5 12/07/2019    Significant Diagnostic Results in last 30 days:  MR ANGIO HEAD WO CONTRAST  Result Date: 12/06/2019 CLINICAL DATA:  49 year old female with acute on chronic small vessel disease. EXAM: MRA HEAD WITHOUT CONTRAST TECHNIQUE: Angiographic images of the Circle of Willis were obtained using MRA technique without intravenous contrast. COMPARISON:  Brain MRI today reported separately. Intracranial MRA 09/02/2018. FINDINGS: Chronically diminutive posterior circulation. Antegrade flow appears stable in diminutive distal vertebral arteries, the left terminates in PICA. The right PICA origin remains patent. The right vertebral supplies a patent but diminutive basilar artery. There is moderate to severe stenosis at the basilar artery origin which may be progressed from last year (series 1038, image 7). Fetal type bilateral PCA origins. The basilar terminates in the SCA is which remain  patent. Bilateral fetal type PCA origins. Severe right PCA P1 and left PCA P2  stenoses are redemonstrated and stable. Stable antegrade flow in both ICA siphons. Normal posterior communicating artery origins. Mild to moderate bilateral siphon anterior genu/supraclinoid irregularity appears stable with mild stenosis. Carotid termini remain patent. MCA and ACA origins remain patent but there is new moderate stenosis at the left A1 origin (series 1030, image 10). MCA M1 segments and bifurcations remain patent with mild irregularity. Visible bilateral MCA and ACA branches are stable with mild irregularity. IMPRESSION: 1. Chronically age advanced intracranial atherosclerosis. Negative for large vessel occlusion. 2. Diminutive vertebrobasilar system on the basis of fetal type PCA origins, but evidence of hemodynamically significant stenosis at the Vertebrobasilar Junction which appears progressed from last year. Chronic severe stenoses of the Right P1 and Left P2. 3. Stable mild stenoses of the bilateral ICA anterior genu. New moderate stenosis of the Left ACA origin. Electronically Signed   By: Genevie Ann M.D.   On: 12/06/2019 19:17   MR BRAIN WO CONTRAST  Result Date: 12/06/2019 CLINICAL DATA:  Code stroke. 49 year old female with slurred speech. History of prior stroke. EXAM: MRI HEAD WITHOUT CONTRAST TECHNIQUE: Multiplanar, multiecho pulse sequences of the brain and surrounding structures were obtained without intravenous contrast. COMPARISON:  Head CT earlier today. Brain MRI 01/14/2018. FINDINGS: Brain: Oval 10 mm focus of restricted diffusion in the left corona radiata tracking into the posterior left lentiform (series 5, image 73). Faint T2 and FLAIR hyperintensity. No hemorrhage or mass effect. No other restricted diffusion. Small chronic infarct in the deep left cerebellar nuclei is new since 2019. Extensive chronic small vessel infarcts scattered in the bilateral basal ganglia, thalami, right corona radiata.  There is Wallerian degeneration in the right brainstem. Scattered chronic microhemorrhages, with abundant hemosiderin in the posterior right lentiform. Additional scattered and confluent bilateral white matter T2 and FLAIR hyperintensity. No definite cortical encephalomalacia. No midline shift, mass effect, evidence of mass lesion, ventriculomegaly, extra-axial collection or acute intracranial hemorrhage. Cervicomedullary junction and pituitary are within normal limits. Vascular: Major intracranial vascular flow voids are stable since 2019. Skull and upper cervical spine: Negative visible cervical spine and bone marrow signal. Sinuses/Orbits: Negative orbits. Paranasal Visualized paranasal sinuses and mastoids are stable and well pneumatized. Other: Visible internal auditory structures appear normal. Scalp and face soft tissues appear negative. IMPRESSION: 1. Small acute lacunar infarct in the left corona radiata tracking into the posterior left lentiform. No associated hemorrhage or mass effect. 2. Underlying very severe chronic small vessel disease. 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 Electronically Signed   By: Genevie Ann M.D.   On: 12/06/2019 19:11   DG Chest Port 1 View  Result Date: 12/15/2019 CLINICAL DATA:  49 year old female with cough. EXAM: PORTABLE CHEST 1 VIEW COMPARISON:  Chest radiograph dated 09/12/2018. FINDINGS: Stable cardiomegaly. There are bibasilar atelectatic changes. No focal consolidation, pleural effusion, pneumothorax. Atherosclerotic calcification of the aorta. No acute osseous pathology. IMPRESSION: No active disease. Electronically Signed   By: Anner Crete M.D.   On: 12/15/2019 21:08   DG Swallowing Func-Speech Pathology  Result Date: 12/08/2019 Objective Swallowing Evaluation: Type of Study: MBS-Modified Barium Swallow Study  Patient Details Name: Tonea Finton MRN: [id number] Date of Birth: [birth year] Today's Date: 12/08/2019 Time: SLP Start Time (ACUTE  ONLY): 0805 -SLP Stop Time (ACUTE ONLY): 0826 SLP Time Calculation (min) (ACUTE ONLY): 21 min Past Medical History: Past Medical History: Diagnosis Date . Anxiety  . Chronic lower back pain 04/17/2012  "just got over some; catched when I walked" . COPD (chronic obstructive pulmonary disease) (  [place])  . Critical lower limb ischemia 05/27/2018 . Depression  . Headache(784.0) 04/17/2012  "~ qod; lately waking up in am w/one" . High cholesterol  . Hypertension  . Migraines 04/17/2012 . Obesity  . Sleep apnea   CPAP . Stroke ([place])  . Type II diabetes mellitus ([place]) 08/28/2002 Past Surgical History: Past Surgical History: Procedure Laterality Date . ABDOMINAL AORTOGRAM W/LOWER EXTREMITY Left 05/27/2018  Procedure: ABDOMINAL AORTOGRAM W/LOWER EXTREMITY Runoff and Possible Intervention;  Surgeon: Waynetta Sandy, MD;  Location: [place] [facility] LAB;  Service: Cardiovascular;  Laterality: Left; . APPLICATION OF WOUND VAC Left 0000000  Procedure: APPLICATION OF WOUND VAC;  Surgeon: Waynetta Sandy, MD;  Location: [place];  Service: Vascular;  Laterality: Left; . BIOPSY  04/04/2018  Procedure: BIOPSY;  Surgeon: [name], MD;  Location: [facility] ENDOSCOPY;  Service: Endoscopy;; . CARDIAC CATHETERIZATION  ~ 2011 . COLONOSCOPY N/A 04/04/2018  Procedure: COLONOSCOPY;  Surgeon: [name], MD;  Location: [facility] ENDOSCOPY;  Service: Endoscopy;  Laterality: N/A; . LOWER EXTREMITY ANGIOGRAM Left 05/27/2018 . PERIPHERAL VASCULAR INTERVENTION Left 05/27/2018  Procedure: PERIPHERAL VASCULAR INTERVENTION;  Surgeon: Waynetta Sandy, MD;  Location: [place] [facility] LAB;  Service: Cardiovascular;  Laterality: Left; . POLYPECTOMY  04/04/2018  Procedure: POLYPECTOMY;  Surgeon: [name], MD;  Location: [facility] ENDOSCOPY;  Service: Endoscopy;; . TRANSMETATARSAL AMPUTATION Left 05/28/2018  Procedure: AMPUTATION TOES THREE, FOUR AND FIVE on left foot;  Surgeon: Waynetta Sandy, MD;  Location: [place];  Service: Vascular;   Laterality: Left; . WOUND DEBRIDEMENT Left 06/06/2018  Procedure: DEBRIDEMENT WOUND LEFT FOOT;  Surgeon: Waynetta Sandy, MD;  Location: [facility] OR;  Service: Vascular;  Laterality: Left; HPI: 49 year old female with history of hemorrhagic stroke in 10/07/2018 with residual left sided weakness, COPD, hypertension, hyperlipidemia, diabetes mellitus type 2, OSA on CPAP, anxiety, depression, substance abuse (marijuana, crack cocaine) presented with slurred speech. MRI revealed acute small lacunar infarct in left corona radiata. BSE 1/21020 rec'd Dys 3, thin. MBS 09/18/18 oropharyngeal swallow WFL. Espohageal retention mid esophagus and w/u rec'd  Subjective: alert, cooperative Assessment / Plan / Recommendation CHL IP CLINICAL IMPRESSIONS 12/08/2019 Clinical Impression  Patient presents with mild oropharyngeal dysphagia. Oral phase is remarkable for decreased bolus cohesion, premature spillage to the vallecula and pyriform sinuses and lingual residue. When trialing a regular solid, the pt stated she was going to swallow the bolus before attempting to masticate, requiring Min cues to continue to fully masticate prior to swallowing. Pharyngeal phase is remarkable for delayed swallow initiation at the pyriform sinuses and reduced laryngeal closure.  Patient's vallecular space was also noted to be small, making it difficult for the bolus to collect there prior to the swallow. Liquids would either spill over into the larynx or to the pyriform sinsues. The small vallecular space and reduced laryngeal closure ultimately resulted in penetration/aspiration. Aspiration (PAS 7 or 8) occurred during the swallow with thin liquids. As the pt was challenged by taking large consecutive sips, she aspirated, spit out what was remaining in her oral cavity and was noted with suspected nasopharyngeal regurgitation (nasal secretions and what appeared to be some barium expelled from her nasal cavity). The fluoro was not on during suspected  nasopharyngeal regurgitation, therefore cannot confirm. A chin tuck was trialed to attempt to increase the space in the vallecula, however, this was unsuccessful. Recommend Dys 2 diet, nectar thick liquids, meds crushed in puree, with full supervision. Ensure the pt is taking small, single sips at a time and is following  general aspiration precautions.  SLP Visit Diagnosis Dysphagia, oropharyngeal phase (R13.12) Attention and concentration deficit following -- Frontal lobe and executive function deficit following -- Impact on safety and function Moderate aspiration risk   CHL IP TREATMENT RECOMMENDATION 12/08/2019 Treatment Recommendations Therapy as outlined in treatment plan below   Prognosis 12/08/2019 Prognosis for Safe Diet Advancement Good Barriers to Reach Goals -- Barriers/Prognosis Comment -- CHL IP DIET RECOMMENDATION 12/08/2019 SLP Diet Recommendations Dysphagia 2 (Fine chop) solids;Nectar thick liquid Liquid Administration via Cup;Straw Medication Administration Crushed with puree Compensations Minimize environmental distractions;Slow rate;Small sips/bites Postural Changes Remain semi-upright after after feeds/meals (Comment);Seated upright at 90 degrees   CHL IP OTHER RECOMMENDATIONS 12/08/2019 Recommended Consults -- Oral Care Recommendations Oral care BID Other Recommendations Prohibited food (jello, ice cream, thin soups);Order thickener from pharmacy;Remove water pitcher   CHL IP FOLLOW UP RECOMMENDATIONS 12/08/2019 Follow up Recommendations Skilled Nursing facility   [facility] IP FREQUENCY AND DURATION 12/08/2019 Speech Therapy Frequency (ACUTE ONLY) min 2x/week Treatment Duration 2 weeks      CHL IP ORAL PHASE 12/08/2019 Oral Phase Impaired Oral - Pudding Teaspoon -- Oral - Pudding Cup -- Oral - Honey Teaspoon -- Oral - Honey Cup -- Oral - Nectar Teaspoon -- Oral - Nectar Cup Lingual/palatal residue Oral - Nectar Straw Lingual/palatal residue Oral - Thin Teaspoon -- Oral - Thin Cup Decreased bolus  cohesion;Premature spillage Oral - Thin Straw Decreased bolus cohesion;Premature spillage Oral - Puree WFL Oral - Mech Soft -- Oral - Regular Delayed oral transit Oral - Multi-Consistency -- Oral - Pill -- Oral Phase - Comment --  CHL IP PHARYNGEAL PHASE 12/08/2019 Pharyngeal Phase Impaired Pharyngeal- Pudding Teaspoon -- Pharyngeal -- Pharyngeal- Pudding Cup -- Pharyngeal -- Pharyngeal- Honey Teaspoon -- Pharyngeal -- Pharyngeal- Honey Cup -- Pharyngeal -- Pharyngeal- Nectar Teaspoon -- Pharyngeal -- Pharyngeal- Nectar Cup Penetration/Aspiration during swallow;Reduced airway/laryngeal closure Pharyngeal Material enters airway, remains ABOVE vocal cords then ejected out Pharyngeal- Nectar Straw Penetration/Aspiration during swallow;Reduced airway/laryngeal closure Pharyngeal Material enters airway, remains ABOVE vocal cords then ejected out Pharyngeal- Thin Teaspoon -- Pharyngeal -- Pharyngeal- Thin Cup Penetration/Aspiration during swallow;Trace aspiration;Delayed swallow initiation-pyriform sinuses;Compensatory strategies attempted (with notebox);Other (Comment);Reduced airway/laryngeal closure Pharyngeal Material enters airway, passes BELOW cords without attempt by patient to eject out (silent aspiration) Pharyngeal- Thin Straw Penetration/Aspiration during swallow;Trace aspiration;Delayed swallow initiation-pyriform sinuses;Compensatory strategies attempted (with notebox);Other (Comment);Reduced airway/laryngeal closure Pharyngeal Material enters airway, passes BELOW cords without attempt by patient to eject out (silent aspiration) Pharyngeal- Puree WFL Pharyngeal -- Pharyngeal- Mechanical Soft -- Pharyngeal -- Pharyngeal- Regular WFL Pharyngeal -- Pharyngeal- Multi-consistency -- Pharyngeal -- Pharyngeal- Pill -- Pharyngeal -- Pharyngeal Comment --  CHL IP CERVICAL ESOPHAGEAL PHASE 12/08/2019 Cervical Esophageal Phase WFL Pudding Teaspoon -- Pudding Cup -- Honey Teaspoon -- Honey Cup -- Nectar Teaspoon -- Nectar  Cup -- Nectar Straw -- Thin Teaspoon -- Thin Cup -- Thin Straw -- Puree -- Mechanical Soft -- Regular -- Multi-consistency -- Pill -- Cervical Esophageal Comment -- Osie Bond., M.A. [facility] Pager [phone] Office [phone] 12/08/2019, 9:45 AM              ECHOCARDIOGRAM COMPLETE  Result Date: 12/07/2019    ECHOCARDIOGRAM REPORT   Patient Name:   DORIANN CONSTANTINO Date of Exam: 12/07/2019 Medical Rec #:  [id number]         Height:       61.0 in Accession #:    [id number]        Weight:  195.0 lb Date of Birth:  1971-02-22          BSA:          1.869 m Patient Age:    21 years          BP:           168/88 mmHg Patient Gender: F                 HR:           50 bpm. Exam Location:  Inpatient Procedure: 2D Echo Indications:    Stroke 434.91 / I163.9  History:        Patient has prior history of Echocardiogram examinations, most                 recent 06/24/2018. Signs/Symptoms:Dyspnea; Risk Factors:Tobacco                 abuse, Hypertension, Diabetes and Dyslipidemia. Cocaine abuse                 Acute renal failure                 Aortic valve insufficiency.  Sonographer:    Vikki Ports Turrentine Referring Phys: [id number] [place]  1. Left ventricular ejection fraction, by estimation, is 65 to 70%. The left ventricle has normal function. The left ventricle has no regional wall motion abnormalities. There is moderate concentric left ventricular hypertrophy. Left ventricular diastolic parameters are consistent with Grade I diastolic dysfunction (impaired relaxation). Elevated left ventricular end-diastolic pressure.  2. Right ventricular systolic function is normal. The right ventricular size is normal. Tricuspid regurgitation signal is inadequate for assessing PA pressure.  3. The mitral valve is normal in structure. Trivial mitral valve regurgitation. No evidence of mitral stenosis.  4. The aortic valve is tricuspid. Aortic valve regurgitation is mild to  mdoerate. Aortic valve mean gradient measures 9.0 mmHg. Aortic valve peak gradient measures 16.4 mmHg. Aortic valve area, by VTI measures 1.53 cm. Very mild aortic stenosis.  5. The inferior vena cava is normal in size with greater than 50% respiratory variability, suggesting right atrial pressure of 3 mmHg.  6. Left atrial size was moderately dilated. FINDINGS  Left Ventricle: Left ventricular ejection fraction, by estimation, is 65 to 70%. The left ventricle has normal function. The left ventricle has no regional wall motion abnormalities. The left ventricular internal cavity size was normal in size. There is  moderate concentric left ventricular hypertrophy. Left ventricular diastolic parameters are consistent with Grade I diastolic dysfunction (impaired relaxation). Elevated left ventricular end-diastolic pressure. Right Ventricle: The right ventricular size is normal. No increase in right ventricular wall thickness. Right ventricular systolic function is normal. Tricuspid regurgitation signal is inadequate for assessing PA pressure. Left Atrium: Left atrial size was moderately dilated. Right Atrium: Right atrial size was normal in size. Pericardium: There is no evidence of pericardial effusion. Mitral Valve: The mitral valve is normal in structure. Normal mobility of the mitral valve leaflets. Trivial mitral valve regurgitation. No evidence of mitral valve stenosis. Tricuspid Valve: The tricuspid valve is normal in structure. Tricuspid valve regurgitation is trivial. No evidence of tricuspid stenosis. Aortic Valve: The aortic valve is tricuspid. Aortic valve regurgitation is mild to moderate. Aortic regurgitation PHT measures 944 msec. There is moderate calcification of the aortic valve. Aortic valve mean gradient measures 9.0 mmHg. Aortic valve peak gradient measures 16.4 mmHg. Aortic valve area, by VTI measures 1.53 cm. Very mild aortic stenosis.  Pulmonic Valve: The pulmonic valve was normal in structure.  Pulmonic valve regurgitation is not visualized. No evidence of pulmonic stenosis. Aorta: The aortic root is normal in size and structure. Venous: The inferior vena cava is normal in size with greater than 50% respiratory variability, suggesting right atrial pressure of 3 mmHg. IAS/Shunts: No atrial level shunt detected by color flow Doppler.  LEFT VENTRICLE PLAX 2D LVIDd:         3.60 cm  Diastology LVIDs:         2.20 cm  LV e' lateral:   4.03 cm/s LV PW:         1.40 cm  LV E/e' lateral: 19.3 LV IVS:        1.40 cm  LV e' medial:    5.22 cm/s LVOT diam:     1.70 cm  LV E/e' medial:  14.9 LV SV:         64 LV SV Index:   34 LVOT Area:     2.27 cm  RIGHT VENTRICLE RV S prime:     15.30 cm/s TAPSE (M-mode): 2.7 cm LEFT ATRIUM             Index       RIGHT ATRIUM           Index LA diam:        4.50 cm 2.41 cm/m  RA Area:     19.90 cm LA Vol (A2C):   91.5 ml 48.97 ml/m RA Volume:   51.10 ml  27.35 ml/m LA Vol (A4C):   75.3 ml 40.30 ml/m LA Biplane Vol: 85.9 ml 45.97 ml/m  AORTIC VALVE AV Area (Vmax):    1.48 cm AV Area (Vmean):   1.46 cm AV Area (VTI):     1.53 cm AV Vmax:           202.50 cm/s AV Vmean:          141.500 cm/s AV VTI:            0.416 m AV Peak Grad:      16.4 mmHg AV Mean Grad:      9.0 mmHg LVOT Vmax:         132.00 cm/s LVOT Vmean:        91.000 cm/s LVOT VTI:          0.280 m LVOT/AV VTI ratio: 0.67 AI PHT:            944 msec  AORTA Ao Root diam: 2.80 cm MITRAL VALVE MV Area (PHT): 2.48 cm     SHUNTS MV Decel Time: 306 msec     Systemic VTI:  0.28 m MV E velocity: 77.70 cm/s   Systemic Diam: 1.70 cm MV A velocity: 114.00 cm/s MV E/A ratio:  0.68 Fransico Him MD Electronically signed by Fransico Him MD Signature Date/Time: 12/07/2019/2:07:10 PM    Final    CT HEAD CODE STROKE WO CONTRAST  Result Date: 12/06/2019 CLINICAL DATA:  Code stroke. 49 year old female with slurred speech. History of prior stroke. EXAM: CT HEAD WITHOUT CONTRAST TECHNIQUE: Contiguous axial images were obtained  from the base of the skull through the vertex without intravenous contrast. COMPARISON:  Head CT 06/26/2019. Brain MRI 09/02/2018. FINDINGS: Brain: Advanced chronic cerebral white matter and deep gray nuclei hypodensity. No acute intracranial hemorrhage identified. No midline shift, mass effect, or evidence of intracranial mass lesion. No ventriculomegaly. Small chronic infarct in the left cerebellum. No cortically based acute infarct identified. Vascular:  Calcified atherosclerosis at the skull base. No suspicious intracranial vascular hyperdensity. Skull: Stable hyperostosis. No acute osseous abnormality identified. Sinuses/Orbits: Visualized paranasal sinuses and mastoids are stable and well pneumatized. Other: Leftward gaze. Negative scalp soft tissues. ASPECTS [facility] Stroke Program Early CT Score) Total score (0-10 with 10 being normal): 10 IMPRESSION: 1. Stable CT appearance of advanced chronic small vessel disease since October. ASPECTS 10. 2. These results were communicated to Dr. Lorraine Lax at 5:37 pmon 4/10/2021by text page via the [facility] messaging system. Electronically Signed   By: Genevie Ann M.D.   On: 12/06/2019 17:38   VAS US CAROTID  Result Date: 12/08/2019 Carotid Arterial Duplex Study Indications:       CVA and Speech disturbance. Risk Factors:      Hypertension, hyperlipidemia, Diabetes, prior CVA. Other Factors:     Substance abuse. Comparison Study:  No prior study on file Performing Technologist: Sharion Dove RVS  Examination Guidelines: A complete evaluation includes B-mode imaging, spectral Doppler, color Doppler, and power Doppler as needed of all accessible portions of each vessel. Bilateral testing is considered an integral part of a complete examination. Limited examinations for reoccurring indications may be performed as noted.  Right Carotid Findings: +----------+--------+--------+--------+------------------+------------------+           PSV cm/sEDV cm/sStenosisPlaque  DescriptionComments           +----------+--------+--------+--------+------------------+------------------+ CCA Prox  1       0                                 intimal thickening +----------+--------+--------+--------+------------------+------------------+ CCA Distal1       0                                 intimal thickening +----------+--------+--------+--------+------------------+------------------+ ICA Prox  1       0               heterogenous      tortuous           +----------+--------+--------+--------+------------------+------------------+ ICA Distal1       0                                 tortuous           +----------+--------+--------+--------+------------------+------------------+ ECA       1       0                                                    +----------+--------+--------+--------+------------------+------------------+ +----------+--------+-------+--------+-------------------+           PSV cm/sEDV cmsDescribeArm Pressure (mmHG) +----------+--------+-------+--------+-------------------+ Subclavian1                                          +----------+--------+-------+--------+-------------------+ +---------+--------+-+--------+-+ VertebralPSV cm/s0EDV cm/s0 +---------+--------+-+--------+-+  Left Carotid Findings: +----------+--------+--------+--------+------------------+------------------+           PSV cm/sEDV cm/sStenosisPlaque DescriptionComments           +----------+--------+--------+--------+------------------+------------------+ CCA Prox  1       0  intimal thickening +----------+--------+--------+--------+------------------+------------------+ CCA Distal1       0                                 intimal thickening +----------+--------+--------+--------+------------------+------------------+ ICA Prox  1       0                                                     +----------+--------+--------+--------+------------------+------------------+ ICA Distal1       0                                 tortuous           +----------+--------+--------+--------+------------------+------------------+ ECA       1       0                                                    +----------+--------+--------+--------+------------------+------------------+ +----------+--------+--------+--------+-------------------+           PSV cm/sEDV cm/sDescribeArm Pressure (mmHG) +----------+--------+--------+--------+-------------------+ [id number]                                           +----------+--------+--------+--------+-------------------+ +---------+--------+-+--------+-+ VertebralPSV cm/s0EDV cm/s0 +---------+--------+-+--------+-+   Summary: Right Carotid: The extracranial vessels were near-normal with only minimal wall                thickening or plaque. Left Carotid: The extracranial vessels were near-normal with only minimal wall               thickening or plaque. Vertebrals:  Bilateral vertebral arteries demonstrate antegrade flow. Subclavians: Normal flow hemodynamics were seen in bilateral subclavian              arteries. *See table(s) above for measurements and observations.  Electronically signed by Antony Contras MD on 12/08/2019 at 1:55:53 PM.    Final     Assessment/Plan  1. Depression, major, single episode, moderate (HCC) - will start Remeron 15 mg at bedtime  2. Slow transit constipation - Will start Senna-S 8.6-50 mg 2 tabs BID and Miralax 17 gm daily  3. Primary insomnia - Remeron will be started, discontinue Melatonin since it is ineffective     Family/ staff Communication: Discussed plan of care with resident and charge nurse.  Labs/tests ordered:  None  Goals of care:   Short-term care    [name], DNP, FNP-BC [facility] [phone] (Monday-Friday 8:00 a.m. - 5:00  p.m.) [phone] (after hours)

## 2020-01-06 ENCOUNTER — Non-Acute Institutional Stay (SKILLED_NURSING_FACILITY): Payer: Medicare Other | Admitting: Adult Health

## 2020-01-06 ENCOUNTER — Encounter: Payer: Self-pay | Admitting: Adult Health

## 2020-01-06 DIAGNOSIS — F321 Major depressive disorder, single episode, moderate: Secondary | ICD-10-CM

## 2020-01-06 DIAGNOSIS — Z8673 Personal history of transient ischemic attack (TIA), and cerebral infarction without residual deficits: Secondary | ICD-10-CM | POA: Diagnosis not present

## 2020-01-06 DIAGNOSIS — F250 Schizoaffective disorder, bipolar type: Secondary | ICD-10-CM

## 2020-01-06 DIAGNOSIS — E1151 Type 2 diabetes mellitus with diabetic peripheral angiopathy without gangrene: Secondary | ICD-10-CM | POA: Diagnosis not present

## 2020-01-06 DIAGNOSIS — I10 Essential (primary) hypertension: Secondary | ICD-10-CM

## 2020-01-06 DIAGNOSIS — K5901 Slow transit constipation: Secondary | ICD-10-CM

## 2020-01-06 NOTE — Progress Notes (Signed)
Location:  [place] Room Number: 216/A Place of Service:  SNF (31) Provider:  [name], DNP, FNP-BC  Patient Care Team: [name], MD as PCP - General (Internal Medicine) Minus Breeding, MD as PCP - Cardiology (Cardiology)  Extended Emergency Contact Information Primary Emergency Contact: [name], [place] Phone: [phone] Relation: Brother Secondary Emergency Contact: [name] Phone: [phone] Relation: Other  Code Status:  Full Code  Goals of care: Advanced Directive information Advanced Directives 01/06/2020  Does Patient Have a Medical Advance Directive? Yes  Type of Advance Directive (No Data)  Does patient want to make changes to medical advance directive? No - Patient declined  Would patient like information on creating a medical advance directive? -  Some encounter information is confidential and restricted. Go to Review Flowsheets activity to see all data.     Chief Complaint  Patient presents with   Medical Management of Chronic Issues    Routine visit of medical management    HPI:  Pt is a 49 y.o. female seen today for medical management of chronic diseases. She is a short-term care resident of [facility]. She has a PMH of COPD, OSA, IDDM with vascular complications, headaches, essential hypertension, ischemic PVD and polysubstance abuse. She was recently started on Remeron for depression and was having difficulty sleeping. She verbalized sleeping better. On 01/05/20, she got her COVID-19 vaccine. No reported adverse reactions. SBPs ranging from 102 to 140. She was recently started on Metoprolol in addition to  Lisinopril, Clonidine, Amlodipine and Hydralazine.   Past Medical History:  Diagnosis Date   Anxiety    Chronic lower back pain 04/17/2012   "just got over some; catched when I walked"   COPD (chronic obstructive  pulmonary disease) ([place])    Critical lower limb ischemia 05/27/2018   Depression    Headache(784.0) 04/17/2012   "~ qod; lately waking up in am w/one"   High cholesterol    Hypertension    Migraines 04/17/2012   Obesity    Sleep apnea    CPAP   Stroke ([place])    Type II diabetes mellitus ([place]) 08/28/2002   Past Surgical History:  Procedure Laterality Date   ABDOMINAL AORTOGRAM W/LOWER EXTREMITY Left 05/27/2018   Procedure: ABDOMINAL AORTOGRAM W/LOWER EXTREMITY Runoff and Possible Intervention;  Surgeon: Waynetta Sandy, MD;  Location: [place] [facility];  Service: Cardiovascular;  Laterality: Left;   APPLICATION OF WOUND VAC Left 06/06/2018   Procedure: APPLICATION OF WOUND VAC;  Surgeon: Waynetta Sandy, MD;  Location: [place];  Service: Vascular;  Laterality: Left;   BIOPSY  04/04/2018   Procedure: BIOPSY;  Surgeon: [name], MD;  Location: [facility] ENDOSCOPY;  Service: Endoscopy;;   CARDIAC CATHETERIZATION  ~ 2011   COLONOSCOPY N/A 04/04/2018   Procedure: COLONOSCOPY;  Surgeon: [name], MD;  Location: [facility] ENDOSCOPY;  Service: Endoscopy;  Laterality: N/A;   LOWER EXTREMITY ANGIOGRAM Left 05/27/2018   PERIPHERAL VASCULAR INTERVENTION Left 05/27/2018   Procedure: PERIPHERAL VASCULAR INTERVENTION;  Surgeon: Waynetta Sandy, MD;  Location: [place] [facility];  Service: Cardiovascular;  Laterality: Left;   POLYPECTOMY  04/04/2018   Procedure: POLYPECTOMY;  Surgeon: [name], MD;  Location: [facility] ENDOSCOPY;  Service: Endoscopy;;   TRANSMETATARSAL AMPUTATION Left 05/28/2018   Procedure: AMPUTATION TOES THREE, FOUR AND FIVE on left foot;  Surgeon: Waynetta Sandy, MD;  Location: [place];  Service: Vascular;  Laterality: Left;   WOUND DEBRIDEMENT Left 06/06/2018   Procedure: DEBRIDEMENT WOUND LEFT FOOT;  Surgeon: Waynetta Sandy, MD;  Location: [place];  Service: Vascular;  Laterality: Left;    Allergies  Allergen Reactions    Morphine And Related Hives   Metformin Diarrhea and Other (See Comments)    "Allergic," per [facility]    Outpatient Encounter Medications as of 01/06/2020  Medication Sig   amLODipine (NORVASC) 10 MG tablet Take 10 mg by mouth daily.   ARIPiprazole (ABILIFY) 15 MG tablet Take 1 tablet (15 mg total) by mouth at bedtime.   aspirin EC 81 MG EC tablet Take 1 tablet (81 mg total) by mouth daily.   atorvastatin (LIPITOR) 40 MG tablet Take 1 tablet (40 mg total) by mouth daily at 6 PM.   bisacodyl (DULCOLAX) 10 MG suppository Place 10 mg rectally daily as needed for mild constipation or moderate constipation (if no relief from Milk of Magnesia).   cilostazol (PLETAL) 100 MG tablet Take 100 mg by mouth 2 (two) times daily.   cloNIDine (CATAPRES) 0.3 MG tablet Take 0.3 mg by mouth 2 (two) times daily.   ferrous sulfate 325 (65 FE) MG tablet Take 1 tablet (325 mg total) by mouth daily with breakfast.   hydrALAZINE (APRESOLINE) 100 MG tablet Take 100 mg by mouth 3 (three) times daily.   Insulin Detemir (LEVEMIR) 100 UNIT/ML Pen Inject 15 Units into the skin 2 (two) times daily.   lactulose (CHRONULAC) 10 GM/15ML solution Take 30 g by mouth 2 (two) times daily. For Constipation   lisinopril (ZESTRIL) 40 MG tablet Take 40 mg by mouth daily.   magnesium hydroxide (MILK OF MAGNESIA) 400 MG/5ML suspension Take 30 mLs by mouth daily as needed for mild constipation.   metoprolol tartrate (LOPRESSOR) 25 MG tablet Take 12.5 mg by mouth 2 (two) times daily.   mirtazapine (REMERON) 15 MG tablet Take 15 mg by mouth at bedtime. For Depression   NON FORMULARY Regular/thin diet consistency   Nutritional Supplement LIQD Take 120 mLs by mouth daily. MedPass   Nutritional Supplements (NUTRITIONAL SUPPLEMENT PO) Take 1 each by mouth daily. Magic Cup   senna-docusate (SENEXON-S) 8.6-50 MG tablet Take 2 tablets by mouth 2 (two) times daily. For Constipation   Sodium Phosphates (RA SALINE ENEMA) 19-7 GM/118ML  ENEM Place 1 enema rectally daily as needed (if no relief from Dulcolax suppository for constipation and call MD if no relief from enema).   [DISCONTINUED] melatonin 5 MG TABS Take 5 mg by mouth at bedtime.   No facility-administered encounter medications on file as of 01/06/2020.    Review of Systems  GENERAL: No change in appetite, no fatigue, no weight changes, no fever, chills or weakness MOUTH and THROAT: Denies oral discomfort, gingival pain or bleeding RESPIRATORY: no cough, SOB, DOE, wheezing, hemoptysis CARDIAC: No chest pain, edema or palpitations GI: No abdominal pain, diarrhea, constipation, heart burn, nausea or vomiting GU: Denies dysuria, frequency, hematuria, incontinence, or discharge NEUROLOGICAL: Denies dizziness, syncope, numbness, or headache PSYCHIATRIC: Denies feelings of depression or anxiety. No report of hallucinations, insomnia, paranoia, or agitation   Immunization History  Administered Date(s) Administered   Influenza Inj Mdck Quad Pf 07/16/2019   Influenza,inj,Quad PF,6+ Mos 05/24/2013, 08/29/2014, 10/08/2015   Influenza-Unspecified 06/22/2016, 07/01/2018   PPD Test 11/10/2016   Pneumococcal Polysaccharide-23 04/18/2012, 10/08/2015, 02/14/2017   Pertinent  Health Maintenance Due  Topic Date Due   PAP SMEAR-Modifier  Never done   OPHTHALMOLOGY EXAM  08/13/2014   FOOT EXAM  08/20/2019   INFLUENZA VACCINE  03/28/2020   HEMOGLOBIN A1C  06/07/2020   Fall Risk  12/07/2014 10/14/2014  Falls in the past year? No No     Vitals:   01/06/20 0916  BP: 126/71  Pulse: 82  Resp: 20  Temp: 97.6 F (36.4 C)  TempSrc: Oral  SpO2: 96%  Weight: 184 lb 3.2 oz (83.6 kg)  Height: 5\' 1"  (1.549 m)   Body mass index is 34.8 kg/m.  Physical Exam  GENERAL APPEARANCE: Well nourished. In no acute distress. Obese SKIN:  Skin is warm and dry.  MOUTH and THROAT: Lips are without lesions. Oral mucosa is moist and without lesions.  RESPIRATORY: Breathing  is even & unlabored, BS CTAB CARDIAC: RRR, no murmur,no extra heart sounds, no edema GI: Abdomen soft, normal BS, no masses, no tenderness NEUROLOGICAL: There is no tremor. Has a wet voice. Alert and oriented X 3. Left-sided weakness. PSYCHIATRIC:  Affect and behavior are appropriate  Labs reviewed: Recent Labs    12/08/19 0342 12/08/19 0342 12/09/19 0332 12/09/19 0332 12/10/19 0500 12/15/19 2043 12/17/19 0000  NA 142   < > 141   < > 142 140 141  K 3.2*   < > 3.8   < > 3.7 3.5 4.2  CL 107   < > 110   < > 110 107 107  CO2 23   < > 21*   < > 23 22 22   GLUCOSE 88   < > 100*  --  85 132*  --   BUN 14   < > 12   < > 12 15 14   CREATININE 1.44*   < > 1.36*   < > 1.48* 1.26* 1.1  CALCIUM 8.9   < > 9.0   < > 9.3 9.1 9.0  MG 2.0  --  2.1  --  2.2  --   --    < > = values in this interval not displayed.   Recent Labs    06/26/19 1638 12/06/19 1728 12/08/19 0342  AST 14* 17 12*  ALT 11 8 8   ALKPHOS 60 49 51  BILITOT 0.9 0.4 1.0  PROT 7.5 7.0 6.6  ALBUMIN 3.5 3.0* 3.1*   Recent Labs    12/09/19 0332 12/09/19 0332 12/10/19 0500 12/15/19 2043 12/17/19 0000  WBC 7.0   < > 7.3 5.4 5.9  NEUTROABS 3.1  --  2.6  --  2  HGB 11.7*   < > 11.8* 11.6* 10.8*  HCT 37.4   < > 37.6 37.3 34*  MCV 73.0*  --  74.3* 73.9*  --   PLT 263   < > 251 222 232   < > = values in this interval not displayed.   Lab Results  Component Value Date   TSH 0.705 04/02/2018   Lab Results  Component Value Date   HGBA1C 6.7 (H) 12/07/2019   Lab Results  Component Value Date   CHOL 158 12/07/2019   HDL 45 12/07/2019   LDLCALC 95 12/07/2019   TRIG 89 12/07/2019   CHOLHDL 3.5 12/07/2019    Significant Diagnostic Results in last 30 days:  DG Chest Port 1 View  Result Date: 12/15/2019 CLINICAL DATA:  49 year old female with cough. EXAM: PORTABLE CHEST 1 VIEW COMPARISON:  Chest radiograph dated 09/12/2018. FINDINGS: Stable cardiomegaly. There are bibasilar atelectatic changes. No focal consolidation,  pleural effusion, pneumothorax. Atherosclerotic calcification of the aorta. No acute osseous pathology. IMPRESSION: No  active disease. Electronically Signed   By: Anner Crete M.D.   On: 12/15/2019 21:08   DG Swallowing Func-Speech Pathology  Result Date: 12/08/2019 Objective Swallowing Evaluation: Type of Study: MBS-Modified Barium Swallow Study  Patient Details Name: Jennaveve Salters MRN: [id number] Date of Birth: June 24, 1971 Today's Date: 12/08/2019 Time: SLP Start Time (ACUTE ONLY): 0805 -SLP Stop Time (ACUTE ONLY): 0826 SLP Time Calculation (min) (ACUTE ONLY): 21 min Past Medical History: Past Medical History: Diagnosis Date  Anxiety   Chronic lower back pain 04/17/2012  "just got over some; catched when I walked"  COPD (chronic obstructive pulmonary disease) ([place])   Critical lower limb ischemia 05/27/2018  Depression   Headache(784.0) 04/17/2012  "~ qod; lately waking up in am w/one"  High cholesterol   Hypertension   Migraines 04/17/2012  Obesity   Sleep apnea   CPAP  Stroke ([place])   Type II diabetes mellitus ([place]) 08/28/2002 Past Surgical History: Past Surgical History: Procedure Laterality Date  ABDOMINAL AORTOGRAM W/LOWER EXTREMITY Left 05/27/2018  Procedure: ABDOMINAL AORTOGRAM W/LOWER EXTREMITY Runoff and Possible Intervention;  Surgeon: Waynetta Sandy, MD;  Location: [place] [facility];  Service: Cardiovascular;  Laterality: Left;  APPLICATION OF WOUND VAC Left 0000000  Procedure: APPLICATION OF WOUND VAC;  Surgeon: Waynetta Sandy, MD;  Location: [place];  Service: Vascular;  Laterality: Left;  BIOPSY  04/04/2018  Procedure: BIOPSY;  Surgeon: [name], MD;  Location: [facility] ENDOSCOPY;  Service: Endoscopy;;  CARDIAC CATHETERIZATION  ~ 2011  COLONOSCOPY N/A 04/04/2018  Procedure: COLONOSCOPY;  Surgeon: [name], MD;  Location: [facility] ENDOSCOPY;  Service: Endoscopy;  Laterality: N/A;  LOWER EXTREMITY ANGIOGRAM Left 05/27/2018  PERIPHERAL VASCULAR INTERVENTION Left  05/27/2018  Procedure: PERIPHERAL VASCULAR INTERVENTION;  Surgeon: Waynetta Sandy, MD;  Location: [place] [facility];  Service: Cardiovascular;  Laterality: Left;  POLYPECTOMY  04/04/2018  Procedure: POLYPECTOMY;  Surgeon: [name], MD;  Location: [facility] ENDOSCOPY;  Service: Endoscopy;;  TRANSMETATARSAL AMPUTATION Left 05/28/2018  Procedure: AMPUTATION TOES THREE, FOUR AND FIVE on left foot;  Surgeon: Waynetta Sandy, MD;  Location: [place];  Service: Vascular;  Laterality: Left;  WOUND DEBRIDEMENT Left 06/06/2018  Procedure: DEBRIDEMENT WOUND LEFT FOOT;  Surgeon: Waynetta Sandy, MD;  Location: [facility];  Service: Vascular;  Laterality: Left; HPI: 49 year old female with history of hemorrhagic stroke in 10/07/2018 with residual left sided weakness, COPD, hypertension, hyperlipidemia, diabetes mellitus type 2, OSA on CPAP, anxiety, depression, substance abuse (marijuana, crack cocaine) presented with slurred speech. MRI revealed acute small lacunar infarct in left corona radiata. BSE 1/21020 rec'd Dys 3, thin. MBS 09/18/18 oropharyngeal swallow WFL. Espohageal retention mid esophagus and w/u rec'd  Subjective: alert, cooperative Assessment / Plan / Recommendation CHL IP CLINICAL IMPRESSIONS 12/08/2019 Clinical Impression  Patient presents with mild oropharyngeal dysphagia. Oral phase is remarkable for decreased bolus cohesion, premature spillage to the vallecula and pyriform sinuses and lingual residue. When trialing a regular solid, the pt stated she was going to swallow the bolus before attempting to masticate, requiring Min cues to continue to fully masticate prior to swallowing. Pharyngeal phase is remarkable for delayed swallow initiation at the pyriform sinuses and reduced laryngeal closure.  Patient's vallecular space was also noted to be small, making it difficult for the bolus to collect there prior to the swallow. Liquids would either spill over into the larynx or to the pyriform  sinsues. The small vallecular space and reduced laryngeal closure ultimately resulted in penetration/aspiration. Aspiration (PAS 7 or 8) occurred during  the swallow with thin liquids. As the pt was challenged by taking large consecutive sips, she aspirated, spit out what was remaining in her oral cavity and was noted with suspected nasopharyngeal regurgitation (nasal secretions and what appeared to be some barium expelled from her nasal cavity). The fluoro was not on during suspected nasopharyngeal regurgitation, therefore cannot confirm. A chin tuck was trialed to attempt to increase the space in the vallecula, however, this was unsuccessful. Recommend Dys 2 diet, nectar thick liquids, meds crushed in puree, with full supervision. Ensure the pt is taking small, single sips at a time and is following general aspiration precautions.  SLP Visit Diagnosis Dysphagia, oropharyngeal phase (R13.12) Attention and concentration deficit following -- Frontal lobe and executive function deficit following -- Impact on safety and function Moderate aspiration risk   CHL IP TREATMENT RECOMMENDATION 12/08/2019 Treatment Recommendations Therapy as outlined in treatment plan below   Prognosis 12/08/2019 Prognosis for Safe Diet Advancement Good Barriers to Reach Goals -- Barriers/Prognosis Comment -- CHL IP DIET RECOMMENDATION 12/08/2019 SLP Diet Recommendations Dysphagia 2 (Fine chop) solids;Nectar thick liquid Liquid Administration via Cup;Straw Medication Administration Crushed with puree Compensations Minimize environmental distractions;Slow rate;Small sips/bites Postural Changes Remain semi-upright after after feeds/meals (Comment);Seated upright at 90 degrees   CHL IP OTHER RECOMMENDATIONS 12/08/2019 Recommended Consults -- Oral Care Recommendations Oral care BID Other Recommendations Prohibited food (jello, ice cream, thin soups);Order thickener from pharmacy;Remove water pitcher   CHL IP FOLLOW UP RECOMMENDATIONS 12/08/2019 Follow  up Recommendations Skilled Nursing facility   [facility] IP FREQUENCY AND DURATION 12/08/2019 Speech Therapy Frequency (ACUTE ONLY) min 2x/week Treatment Duration 2 weeks      CHL IP ORAL PHASE 12/08/2019 Oral Phase Impaired Oral - Pudding Teaspoon -- Oral - Pudding Cup -- Oral - Honey Teaspoon -- Oral - Honey Cup -- Oral - Nectar Teaspoon -- Oral - Nectar Cup Lingual/palatal residue Oral - Nectar Straw Lingual/palatal residue Oral - Thin Teaspoon -- Oral - Thin Cup Decreased bolus cohesion;Premature spillage Oral - Thin Straw Decreased bolus cohesion;Premature spillage Oral - Puree WFL Oral - Mech Soft -- Oral - Regular Delayed oral transit Oral - Multi-Consistency -- Oral - Pill -- Oral Phase - Comment --  CHL IP PHARYNGEAL PHASE 12/08/2019 Pharyngeal Phase Impaired Pharyngeal- Pudding Teaspoon -- Pharyngeal -- Pharyngeal- Pudding Cup -- Pharyngeal -- Pharyngeal- Honey Teaspoon -- Pharyngeal -- Pharyngeal- Honey Cup -- Pharyngeal -- Pharyngeal- Nectar Teaspoon -- Pharyngeal -- Pharyngeal- Nectar Cup Penetration/Aspiration during swallow;Reduced airway/laryngeal closure Pharyngeal Material enters airway, remains ABOVE vocal cords then ejected out Pharyngeal- Nectar Straw Penetration/Aspiration during swallow;Reduced airway/laryngeal closure Pharyngeal Material enters airway, remains ABOVE vocal cords then ejected out Pharyngeal- Thin Teaspoon -- Pharyngeal -- Pharyngeal- Thin Cup Penetration/Aspiration during swallow;Trace aspiration;Delayed swallow initiation-pyriform sinuses;Compensatory strategies attempted (with notebox);Other (Comment);Reduced airway/laryngeal closure Pharyngeal Material enters airway, passes BELOW cords without attempt by patient to eject out (silent aspiration) Pharyngeal- Thin Straw Penetration/Aspiration during swallow;Trace aspiration;Delayed swallow initiation-pyriform sinuses;Compensatory strategies attempted (with notebox);Other (Comment);Reduced airway/laryngeal closure Pharyngeal Material  enters airway, passes BELOW cords without attempt by patient to eject out (silent aspiration) Pharyngeal- Puree WFL Pharyngeal -- Pharyngeal- Mechanical Soft -- Pharyngeal -- Pharyngeal- Regular WFL Pharyngeal -- Pharyngeal- Multi-consistency -- Pharyngeal -- Pharyngeal- Pill -- Pharyngeal -- Pharyngeal Comment --  CHL IP CERVICAL ESOPHAGEAL PHASE 12/08/2019 Cervical Esophageal Phase WFL Pudding Teaspoon -- Pudding Cup -- Honey Teaspoon -- Honey Cup -- Nectar Teaspoon -- Nectar Cup -- Nectar Straw -- Thin Teaspoon -- Thin Cup -- Thin Straw -- Puree -- Mechanical  Soft -- Regular -- Multi-consistency -- Pill -- Cervical Esophageal Comment -- Osie Bond., M.A. CCC-SLP Acute Rehabilitation Services Pager [phone] Office [phone] 12/08/2019, 9:45 AM              ECHOCARDIOGRAM COMPLETE  Result Date: 12/07/2019    ECHOCARDIOGRAM REPORT   Patient Name:   KEYARAH GRUNWALD Date of Exam: 12/07/2019 Medical Rec #:  [id number]         Height:       61.0 in Accession #:    [id number]        Weight:       195.0 lb Date of Birth:  Jun 21, 1971          BSA:          1.869 m Patient Age:    29 years          BP:           168/88 mmHg Patient Gender: F                 HR:           50 bpm. Exam Location:  Inpatient Procedure: 2D Echo Indications:    Stroke 434.91 / I163.9  History:        Patient has prior history of Echocardiogram examinations, most                 recent 06/24/2018. Signs/Symptoms:Dyspnea; Risk Factors:Tobacco                 abuse, Hypertension, Diabetes and Dyslipidemia. Cocaine abuse                 Acute renal failure                 Aortic valve insufficiency.  Sonographer:    Vikki Ports Turrentine Referring Phys: [id number] [place]  1. Left ventricular ejection fraction, by estimation, is 65 to 70%. The left ventricle has normal function. The left ventricle has no regional wall motion abnormalities. There is moderate concentric left ventricular hypertrophy. Left ventricular diastolic  parameters are consistent with Grade I diastolic dysfunction (impaired relaxation). Elevated left ventricular end-diastolic pressure.  2. Right ventricular systolic function is normal. The right ventricular size is normal. Tricuspid regurgitation signal is inadequate for assessing PA pressure.  3. The mitral valve is normal in structure. Trivial mitral valve regurgitation. No evidence of mitral stenosis.  4. The aortic valve is tricuspid. Aortic valve regurgitation is mild to mdoerate. Aortic valve mean gradient measures 9.0 mmHg. Aortic valve peak gradient measures 16.4 mmHg. Aortic valve area, by VTI measures 1.53 cm. Very mild aortic stenosis.  5. The inferior vena cava is normal in size with greater than 50% respiratory variability, suggesting right atrial pressure of 3 mmHg.  6. Left atrial size was moderately dilated. FINDINGS  Left Ventricle: Left ventricular ejection fraction, by estimation, is 65 to 70%. The left ventricle has normal function. The left ventricle has no regional wall motion abnormalities. The left ventricular internal cavity size was normal in size. There is  moderate concentric left ventricular hypertrophy. Left ventricular diastolic parameters are consistent with Grade I diastolic dysfunction (impaired relaxation). Elevated left ventricular end-diastolic pressure. Right Ventricle: The right ventricular size is normal. No increase in right ventricular wall thickness. Right ventricular systolic function is normal. Tricuspid regurgitation signal is inadequate for assessing PA pressure. Left Atrium: Left atrial size was moderately dilated. Right Atrium: Right atrial size was normal in size. Pericardium: There  is no evidence of pericardial effusion. Mitral Valve: The mitral valve is normal in structure. Normal mobility of the mitral valve leaflets. Trivial mitral valve regurgitation. No evidence of mitral valve stenosis. Tricuspid Valve: The tricuspid valve is normal in structure. Tricuspid  valve regurgitation is trivial. No evidence of tricuspid stenosis. Aortic Valve: The aortic valve is tricuspid. Aortic valve regurgitation is mild to moderate. Aortic regurgitation PHT measures 944 msec. There is moderate calcification of the aortic valve. Aortic valve mean gradient measures 9.0 mmHg. Aortic valve peak gradient measures 16.4 mmHg. Aortic valve area, by VTI measures 1.53 cm. Very mild aortic stenosis. Pulmonic Valve: The pulmonic valve was normal in structure. Pulmonic valve regurgitation is not visualized. No evidence of pulmonic stenosis. Aorta: The aortic root is normal in size and structure. Venous: The inferior vena cava is normal in size with greater than 50% respiratory variability, suggesting right atrial pressure of 3 mmHg. IAS/Shunts: No atrial level shunt detected by color flow Doppler.  LEFT VENTRICLE PLAX 2D LVIDd:         3.60 cm  Diastology LVIDs:         2.20 cm  LV e' lateral:   4.03 cm/s LV PW:         1.40 cm  LV E/e' lateral: 19.3 LV IVS:        1.40 cm  LV e' medial:    5.22 cm/s LVOT diam:     1.70 cm  LV E/e' medial:  14.9 LV SV:         64 LV SV Index:   34 LVOT Area:     2.27 cm  RIGHT VENTRICLE RV S prime:     15.30 cm/s TAPSE (M-mode): 2.7 cm LEFT ATRIUM             Index       RIGHT ATRIUM           Index LA diam:        4.50 cm 2.41 cm/m  RA Area:     19.90 cm LA Vol (A2C):   91.5 ml 48.97 ml/m RA Volume:   51.10 ml  27.35 ml/m LA Vol (A4C):   75.3 ml 40.30 ml/m LA Biplane Vol: 85.9 ml 45.97 ml/m  AORTIC VALVE AV Area (Vmax):    1.48 cm AV Area (Vmean):   1.46 cm AV Area (VTI):     1.53 cm AV Vmax:           202.50 cm/s AV Vmean:          141.500 cm/s AV VTI:            0.416 m AV Peak Grad:      16.4 mmHg AV Mean Grad:      9.0 mmHg LVOT Vmax:         132.00 cm/s LVOT Vmean:        91.000 cm/s LVOT VTI:          0.280 m LVOT/AV VTI ratio: 0.67 AI PHT:            944 msec  AORTA Ao Root diam: 2.80 cm MITRAL VALVE MV Area (PHT): 2.48 cm     SHUNTS MV Decel  Time: 306 msec     Systemic VTI:  0.28 m MV E velocity: 77.70 cm/s   Systemic Diam: 1.70 cm MV A velocity: 114.00 cm/s MV E/A ratio:  0.68 Fransico Him MD Electronically signed by Fransico Him MD Signature Date/Time: 12/07/2019/2:07:10 PM  Final    VAS US CAROTID  Result Date: 12/08/2019 Carotid Arterial Duplex Study Indications:       CVA and Speech disturbance. Risk Factors:      Hypertension, hyperlipidemia, Diabetes, prior CVA. Other Factors:     Substance abuse. Comparison Study:  No prior study on file Performing Technologist: Sharion Dove RVS  Examination Guidelines: A complete evaluation includes B-mode imaging, spectral Doppler, color Doppler, and power Doppler as needed of all accessible portions of each vessel. Bilateral testing is considered an integral part of a complete examination. Limited examinations for reoccurring indications may be performed as noted.  Right Carotid Findings: +----------+--------+--------+--------+------------------+------------------+             PSV cm/s EDV cm/s Stenosis Plaque Description Comments            +----------+--------+--------+--------+------------------+------------------+  CCA Prox   1        0                                    intimal thickening  +----------+--------+--------+--------+------------------+------------------+  CCA Distal 1        0                                    intimal thickening  +----------+--------+--------+--------+------------------+------------------+  ICA Prox   1        0                 heterogenous       tortuous            +----------+--------+--------+--------+------------------+------------------+  ICA Distal 1        0                                    tortuous            +----------+--------+--------+--------+------------------+------------------+  ECA        1        0                                                        +----------+--------+--------+--------+------------------+------------------+  +----------+--------+-------+--------+-------------------+             PSV cm/s EDV cms Describe Arm Pressure (mmHG)  +----------+--------+-------+--------+-------------------+  Subclavian 1                                              +----------+--------+-------+--------+-------------------+ +---------+--------+-+--------+-+  Vertebral PSV cm/s 0 EDV cm/s 0  +---------+--------+-+--------+-+  Left Carotid Findings: +----------+--------+--------+--------+------------------+------------------+             PSV cm/s EDV cm/s Stenosis Plaque Description Comments            +----------+--------+--------+--------+------------------+------------------+  CCA Prox   1        0  intimal thickening  +----------+--------+--------+--------+------------------+------------------+  CCA Distal 1        0                                    intimal thickening  +----------+--------+--------+--------+------------------+------------------+  ICA Prox   1        0                                                        +----------+--------+--------+--------+------------------+------------------+  ICA Distal 1        0                                    tortuous            +----------+--------+--------+--------+------------------+------------------+  ECA        1        0                                                        +----------+--------+--------+--------+------------------+------------------+ +----------+--------+--------+--------+-------------------+             PSV cm/s EDV cm/s Describe Arm Pressure (mmHG)  +----------+--------+--------+--------+-------------------+  Subclavian 2                                               +----------+--------+--------+--------+-------------------+ +---------+--------+-+--------+-+  Vertebral PSV cm/s 0 EDV cm/s 0  +---------+--------+-+--------+-+   Summary: Right Carotid: The extracranial vessels were near-normal with only minimal wall                 thickening or plaque. Left Carotid: The extracranial vessels were near-normal with only minimal wall               thickening or plaque. Vertebrals:  Bilateral vertebral arteries demonstrate antegrade flow. Subclavians: Normal flow hemodynamics were seen in bilateral subclavian              arteries. *See table(s) above for measurements and observations.  Electronically signed by Antony Contras MD on 12/08/2019 at 1:55:53 PM.    Final     Assessment/Plan  1. History of CVA (cerebrovascular accident) - stable, continue aspirin and atorvastatin  2. Essential hypertension - improved, continue metoprolol tartrate, clonidine, lisinopril, amlodipine and hydralazine  3. DM (diabetes mellitus) with peripheral vascular complication (HCC) Lab Results  Component Value Date   HGBA1C 6.7 (H) 12/07/2019   -Continue Levemir, cilostazol  4. Depression, major, single episode, moderate ([place]) - verbalized sleeping better, continue mirtazapine   5. Slow transit constipation - improved, continue Senexon-S, lactulose  6. Schizoaffective disorder, bipolar type ([place]) -Mood is stable, continue aripiprazole -Followed up by psych NP    Family/ staff Communication: Discussed plan of care with resident and charge nurse.  Labs/tests ordered: None  Goals of care:   Long-term care   [name], DNP, FNP-BC [facility] [phone] (and  charge nurse -Friday 8:00 a.m. - 5:00 p.m.) [phone] (after hours)

## 2020-01-22 ENCOUNTER — Non-Acute Institutional Stay (SKILLED_NURSING_FACILITY): Payer: Medicare Other | Admitting: Adult Health

## 2020-01-22 ENCOUNTER — Encounter: Payer: Self-pay | Admitting: Adult Health

## 2020-01-22 DIAGNOSIS — E1151 Type 2 diabetes mellitus with diabetic peripheral angiopathy without gangrene: Secondary | ICD-10-CM | POA: Diagnosis not present

## 2020-01-22 DIAGNOSIS — G4733 Obstructive sleep apnea (adult) (pediatric): Secondary | ICD-10-CM

## 2020-01-22 DIAGNOSIS — R1312 Dysphagia, oropharyngeal phase: Secondary | ICD-10-CM

## 2020-01-22 DIAGNOSIS — I10 Essential (primary) hypertension: Secondary | ICD-10-CM

## 2020-01-22 DIAGNOSIS — I639 Cerebral infarction, unspecified: Secondary | ICD-10-CM | POA: Diagnosis not present

## 2020-01-22 DIAGNOSIS — F5101 Primary insomnia: Secondary | ICD-10-CM

## 2020-01-22 DIAGNOSIS — F1420 Cocaine dependence, uncomplicated: Secondary | ICD-10-CM

## 2020-01-22 DIAGNOSIS — F250 Schizoaffective disorder, bipolar type: Secondary | ICD-10-CM

## 2020-01-22 DIAGNOSIS — E7849 Other hyperlipidemia: Secondary | ICD-10-CM

## 2020-01-22 MED ORDER — INSULIN DETEMIR 100 UNIT/ML FLEXPEN: 15.0000 [IU] | PEN_INJECTOR | Freq: Two times a day (BID) | SUBCUTANEOUS | 0 refills | Status: DC

## 2020-01-22 MED ORDER — ATORVASTATIN CALCIUM 40 MG PO TABS: 40.0000 mg | ORAL_TABLET | Freq: Every day | ORAL | Status: DC

## 2020-01-22 MED ORDER — MIRTAZAPINE 15 MG PO TABS: 15.0000 mg | ORAL_TABLET | Freq: Every day | ORAL | 0 refills | Status: DC

## 2020-01-22 MED ORDER — HYDRALAZINE HCL 100 MG PO TABS: 100.0000 mg | ORAL_TABLET | Freq: Three times a day (TID) | ORAL | 0 refills | Status: DC

## 2020-01-22 MED ORDER — CLONIDINE HCL 0.3 MG PO TABS: 0.3000 mg | ORAL_TABLET | Freq: Two times a day (BID) | ORAL | 0 refills | Status: DC

## 2020-01-22 MED ORDER — CILOSTAZOL 100 MG PO TABS: 100.0000 mg | ORAL_TABLET | Freq: Two times a day (BID) | ORAL | 0 refills | Status: DC

## 2020-01-22 MED ORDER — LISINOPRIL 40 MG PO TABS: 40.0000 mg | ORAL_TABLET | Freq: Every day | ORAL | 0 refills | Status: DC

## 2020-01-22 MED ORDER — AMLODIPINE BESYLATE 10 MG PO TABS: 10.0000 mg | ORAL_TABLET | Freq: Every day | ORAL | 0 refills | Status: DC

## 2020-01-22 MED ORDER — METOPROLOL TARTRATE 25 MG PO TABS: 12.5000 mg | ORAL_TABLET | Freq: Two times a day (BID) | ORAL | 0 refills | Status: DC

## 2020-01-22 MED ORDER — ARIPIPRAZOLE 15 MG PO TABS: 15.0000 mg | ORAL_TABLET | Freq: Every day | ORAL | 0 refills | Status: DC

## 2020-01-22 NOTE — Progress Notes (Signed)
Location:  [facility] Room Number: 216-A Place of Service:  SNF (31)  Provider:  Cindi Carbon, [place] [phone]   PCP: [name], MD Patient Care Team: [name], MD as PCP - General (Internal Medicine) Minus Breeding, MD as PCP - Cardiology (Cardiology)  Extended Emergency Contact Information Primary Emergency Contact: [name], [place] Phone: [phone] Relation: Brother Secondary Emergency Contact: [name] Phone: [phone] Relation: Other  Code Status: Full code  Goals of care:  Advanced Directive information Advanced Directives 01/06/2020  Does Patient Have a Medical Advance Directive? Yes  Type of Advance Directive (No Data)  Does patient want to make changes to medical advance directive? No - Patient declined  Would patient like information on creating a medical advance directive? -  Some encounter information is confidential and restricted. Go to Review Flowsheets activity to see all data.     Allergies  Allergen Reactions  . Morphine And Related Hives  . Metformin Diarrhea and Other (See Comments)    "Allergic," per [facility]    Chief Complaint  Patient presents with  . Discharge Note    Patient is seen for discharge from SNF    HPI:  49 y.o. femaleis seen for discharge from [facility]. She was admitted in April s/p CVA (12/06/19-12/10/19).   PMH is significant for HTN, T2DM, hx of PE (2019), PVD, LVH, OSA, COPD, hx of GI bleed (2019), hx of hemorrhagic stroke (2019), microcytic anemia, hx of amputation, hx of osteomyelitis, diastolic HF, and schizoaffective disorder (bipolar type). ECHO in 2019 showed LV EF: 65% - 70%. Also has a hx of substance abuse and tested positive for cocaine and cannabis in the hospital.   She presented with slurred speech to the ER. Neurology was consulted: TPA was not given because of  history of hemorrhagic stroke history. CT of the head was negative for acute finding. MRI of the brain showed acute small lacunar infarct in the left coronary radiata with no associated hemorrhage or mass-effect. MRA head was negative for LVO. Admitted for stroke work-up. Neurology recommended dual antiplatelet therapy for 3 weeks followed by aspirin alone.   She has been at [facility] receiving PT, ST, and OT. She has chronic left sided weakness  And dysphagia. I spoke with PT and they report she has made gains. She propels in the [facility] around [place] but is able to ambulate short distances with a walker. She is eating regular solid food and drinking fluids with a straw.  She lives in an apartment with a friend that helps take care of her and is ready to go home per the SW at [facility].   During her stay she had some difficulty sleeping and was prescribed remeron which seemed to help. She is on levemir for diabetes and reports that she can take insulin on her own. Here at the facility the nurse has been administering it for her. She also has her pills crushed and reports that she has a pill crusher at home.   BMP on 4/21 was WNL   CBC on 4/21 was WNL except Hgb of 10.8 which is close to baseline.  Past Medical History:  Diagnosis Date  . Anxiety   . Chronic lower back pain 04/17/2012   "just got over some; catched when I walked"  . COPD (chronic obstructive pulmonary disease) ([place])   . Critical lower  limb ischemia 05/27/2018  . Depression   . Headache(784.0) 04/17/2012   "~ qod; lately waking up in am w/one"  . High cholesterol   . Hypertension   . Migraines 04/17/2012  . Obesity   . Sleep apnea    CPAP  . Stroke ([place])   . Type II diabetes mellitus ([place]) 08/28/2002    Past Surgical History:  Procedure Laterality Date  . ABDOMINAL AORTOGRAM W/LOWER EXTREMITY Left 05/27/2018   Procedure: ABDOMINAL AORTOGRAM W/LOWER EXTREMITY Runoff and Possible Intervention;  Surgeon: Waynetta Sandy, MD;  Location: [facility] CV LAB;  Service: Cardiovascular;  Laterality: Left;  . APPLICATION OF WOUND VAC Left 06/06/2018   Procedure: APPLICATION OF WOUND VAC;  Surgeon: Waynetta Sandy, MD;  Location: [facility];  Service: Vascular;  Laterality: Left;  . BIOPSY  04/04/2018   Procedure: BIOPSY;  Surgeon: [name], MD;  Location: [facility] ENDOSCOPY;  Service: Endoscopy;;  . CARDIAC CATHETERIZATION  ~ 2011  . COLONOSCOPY N/A 04/04/2018   Procedure: COLONOSCOPY;  Surgeon: [name], MD;  Location: [facility] ENDOSCOPY;  Service: Endoscopy;  Laterality: N/A;  . LOWER EXTREMITY ANGIOGRAM Left 05/27/2018  . PERIPHERAL VASCULAR INTERVENTION Left 05/27/2018   Procedure: PERIPHERAL VASCULAR INTERVENTION;  Surgeon: Waynetta Sandy, MD;  Location: [facility] CV LAB;  Service: Cardiovascular;  Laterality: Left;  . POLYPECTOMY  04/04/2018   Procedure: POLYPECTOMY;  Surgeon: [name], MD;  Location: [facility] ENDOSCOPY;  Service: Endoscopy;;  . TRANSMETATARSAL AMPUTATION Left 05/28/2018   Procedure: AMPUTATION TOES THREE, FOUR AND FIVE on left foot;  Surgeon: Waynetta Sandy, MD;  Location: [facility];  Service: Vascular;  Laterality: Left;  . WOUND DEBRIDEMENT Left 06/06/2018   Procedure: DEBRIDEMENT WOUND LEFT FOOT;  Surgeon: Waynetta Sandy, MD;  Location: [place];  Service: Vascular;  Laterality: Left;      reports that she has quit smoking. Her smoking use included cigarettes. She has a 7.00 pack-year smoking history. She has never used smokeless tobacco. She reports current drug use. Drugs: Marijuana and "Crack" cocaine. She reports that she does not drink alcohol. Social History   Socioeconomic History  . Marital status: Single    Spouse name: Not on file  . Number of children: Not on file  . Years of education: Not on file  . Highest education level: Not on file  Occupational History  . Occupation: disabled  Tobacco Use  . Smoking status: Former Smoker     Packs/day: 0.25    Years: 28.00    Pack years: 7.00    Types: Cigarettes  . Smokeless tobacco: Never Used  Substance and Sexual Activity  . Alcohol use: No    Alcohol/week: 0.0 standard drinks    Comment: rare  . Drug use: Yes    Types: Marijuana, "Crack" cocaine    Comment: marijuana last use days ago; last cocaine several months ago  . Sexual activity: Not Currently    Birth control/protection: None  Other Topics Concern  . Not on file  Social History Narrative   Was a  Quarry manager at [employer]. Lives with godmother.     Social Determinants of Health   Financial Resource Strain:   . Difficulty of Paying Living Expenses:   Food Insecurity:   . Worried About Charity fundraiser in the Last Year:   . Arboriculturist in the Last Year:   Transportation Needs:   . Film/video editor (Medical):   Marland Kitchen Lack of Transportation (Non-Medical):   Physical  Activity:   . Days of Exercise per Week:   . Minutes of Exercise per Session:   Stress:   . Feeling of Stress :   Social Connections:   . Frequency of Communication with Friends and Family:   . Frequency of Social Gatherings with Friends and Family:   . Attends Religious Services:   . Active Member of Clubs or Organizations:   . Attends Archivist Meetings:   Marland Kitchen Marital Status:   Intimate Partner Violence:   . Fear of Current or Ex-Partner:   . Emotionally Abused:   Marland Kitchen Physically Abused:   . Sexually Abused:    Functional Status Survey:    Allergies  Allergen Reactions  . Morphine And Related Hives  . Metformin Diarrhea and Other (See Comments)    "Allergic," per [facility]    Pertinent  Health Maintenance Due  Topic Date Due  . PAP SMEAR-Modifier  Never done  . OPHTHALMOLOGY EXAM  08/13/2014  . FOOT EXAM  08/20/2019  . INFLUENZA VACCINE  03/28/2020  . HEMOGLOBIN A1C  06/07/2020    Medications: Outpatient Encounter Medications as of 01/22/2020  Medication Sig  . amLODipine (NORVASC) 10 MG tablet Take 10 mg by mouth  daily.  . ARIPiprazole (ABILIFY) 15 MG tablet Take 1 tablet (15 mg total) by mouth at bedtime.  Marland Kitchen aspirin EC 81 MG EC tablet Take 1 tablet (81 mg total) by mouth daily.  Marland Kitchen atorvastatin (LIPITOR) 40 MG tablet Take 1 tablet (40 mg total) by mouth daily at 6 PM.  . bisacodyl (DULCOLAX) 10 MG suppository Place 10 mg rectally daily as needed for mild constipation or moderate constipation (if no relief from Milk of Magnesia).  . cilostazol (PLETAL) 100 MG tablet Take 100 mg by mouth 2 (two) times daily.  . cloNIDine (CATAPRES) 0.3 MG tablet Take 0.3 mg by mouth 2 (two) times daily.  . ferrous sulfate 325 (65 FE) MG tablet Take 1 tablet (325 mg total) by mouth daily with breakfast.  . hydrALAZINE (APRESOLINE) 100 MG tablet Take 100 mg by mouth 3 (three) times daily.  . Insulin Detemir (LEVEMIR) 100 UNIT/ML Pen Inject 15 Units into the skin 2 (two) times daily.  Marland Kitchen lactulose (CHRONULAC) 10 GM/15ML solution Take 30 g by mouth 2 (two) times daily. For Constipation  . lisinopril (ZESTRIL) 40 MG tablet Take 40 mg by mouth daily.  . magnesium hydroxide (MILK OF MAGNESIA) 400 MG/5ML suspension Take 30 mLs by mouth daily as needed for mild constipation.  . metoprolol tartrate (LOPRESSOR) 25 MG tablet Take 12.5 mg by mouth 2 (two) times daily.  . mirtazapine (REMERON) 15 MG tablet Take 15 mg by mouth at bedtime. For Depression  . NON FORMULARY Regular/thin diet consistency  . Nutritional Supplement LIQD Take 120 mLs by mouth daily. MedPass  . Nutritional Supplements (NUTRITIONAL SUPPLEMENT PO) Take 1 each by mouth daily. Magic Cup  . senna-docusate (SENEXON-S) 8.6-50 MG tablet Take 2 tablets by mouth 2 (two) times daily. For Constipation  . Sodium Phosphates (RA SALINE ENEMA) 19-7 GM/118ML ENEM Place 1 enema rectally daily as needed (if no relief from Dulcolax suppository for constipation and call MD if no relief from enema).   No facility-administered encounter medications on file as of 01/22/2020.    Review  of Systems  Constitutional: Negative for activity change, appetite change, chills, diaphoresis, fatigue, fever and unexpected weight change.  HENT: Negative for congestion.   Respiratory: Negative for cough, shortness of breath and wheezing.   Cardiovascular:  Negative for chest pain, palpitations and leg swelling.  Gastrointestinal: Negative for abdominal distention, abdominal pain, constipation and diarrhea.  Genitourinary: Negative for difficulty urinating and dysuria.  Musculoskeletal: Positive for gait problem. Negative for arthralgias, back pain, joint swelling and myalgias.  Neurological: Positive for speech difficulty and weakness (left sided weakness). Negative for dizziness, tremors, seizures, syncope, facial asymmetry, light-headedness, numbness and headaches.       Dysphagia  Psychiatric/Behavioral: Negative for agitation, behavioral problems and confusion.    Vitals:   01/22/20 1427 01/22/20 1428  BP: (!) 160/87 (!) 148/71  Pulse: 76   Resp: 18   Temp: 98.1 F (36.7 C)   TempSrc: Oral   Weight: 187 lb 9.6 oz (85.1 kg)   Height: 5\' 1"  (1.549 m)    Body mass index is 35.45 kg/m.  Wt Readings from Last 3 Encounters:  01/22/20 187 lb 9.6 oz (85.1 kg)  01/06/20 184 lb 3.2 oz (83.6 kg)  12/29/19 186 lb 6.4 oz (84.6 kg)    Physical Exam Vitals and nursing note reviewed.  Constitutional:      General: She is not in acute distress.    Appearance: She is not diaphoretic.  HENT:     Head: Normocephalic and atraumatic.     Mouth/Throat:     Mouth: Mucous membranes are moist.     Pharynx: Oropharynx is clear. No oropharyngeal exudate.  Eyes:     Conjunctiva/sclera: Conjunctivae normal.     Pupils: Pupils are equal, round, and reactive to light.  Neck:     Vascular: No JVD.  Cardiovascular:     Rate and Rhythm: Normal rate and regular rhythm.     Heart sounds: No murmur.  Pulmonary:     Effort: Pulmonary effort is normal. No respiratory distress.     Breath sounds:  Normal breath sounds. No wheezing.     Comments: Decreased bases  Abdominal:     General: Bowel sounds are normal. There is no distension.     Palpations: Abdomen is soft.  Musculoskeletal:     Right lower leg: No edema.     Left lower leg: No edema.  Skin:    General: Skin is warm and dry.  Neurological:     Mental Status: She is alert and oriented to person, place, and time.     Comments: Left sided weakness. Hoarse. Dysarthria  Psychiatric:        Mood and Affect: Mood normal.     Labs reviewed: Basic Metabolic Panel: Recent Labs    12/08/19 0342 12/08/19 0342 12/09/19 0332 12/09/19 0332 12/10/19 0500 12/15/19 2043 12/17/19 0000  NA 142   < > 141   < > 142 140 141  K 3.2*   < > 3.8   < > 3.7 3.5 4.2  CL 107   < > 110   < > 110 107 107  CO2 23   < > 21*   < > 23 22 22   GLUCOSE 88   < > 100*  --  85 132*  --   BUN 14   < > 12   < > 12 15 14   CREATININE 1.44*   < > 1.36*   < > 1.48* 1.26* 1.1  CALCIUM 8.9   < > 9.0   < > 9.3 9.1 9.0  MG 2.0  --  2.1  --  2.2  --   --    < > = values in this interval not displayed.   Liver  Function Tests: Recent Labs    06/26/19 1638 12/06/19 1728 12/08/19 0342  AST 14* 17 12*  ALT 11 8 8   ALKPHOS 60 49 51  BILITOT 0.9 0.4 1.0  PROT 7.5 7.0 6.6  ALBUMIN 3.5 3.0* 3.1*   Recent Labs    02/11/19 1231  LIPASE 30   No results for input(s): AMMONIA in the last 8760 hours. CBC: Recent Labs    12/09/19 0332 12/09/19 0332 12/10/19 0500 12/15/19 2043 12/17/19 0000  WBC 7.0   < > 7.3 5.4 5.9  NEUTROABS 3.1  --  2.6  --  2  HGB 11.7*   < > 11.8* 11.6* 10.8*  HCT 37.4   < > 37.6 37.3 34*  MCV 73.0*  --  74.3* 73.9*  --   PLT 263   < > 251 222 232   < > = values in this interval not displayed.   Cardiac Enzymes: No results for input(s): CKTOTAL, CKMB, CKMBINDEX, TROPONINI in the last 8760 hours. BNP: Invalid input(s): POCBNP CBG: Recent Labs    12/15/19 2312 12/16/19 0813 12/16/19 1153  GLUCAP 147* 62* 120*     Procedures and Imaging Studies During Stay: No results found.  Assessment/Plan:    1. Acute CVA (cerebrovascular accident) ([place]) Continues on aspirin 81 mg qd. Has made gains with therapy and is ready for discharge to home with a roommate that assists her with her care.   2. DM (diabetes mellitus) with peripheral vascular complication (HCC) 123XX123 6.7 in April. CBGS controlled in the am and slightly above goal in the pm.  Pt needs to return demonstrate to the staff that she can administer insulin and check her CBGS before she can go home. I discussed this with the DON Renatta - insulin detemir (LEVEMIR) 100 UNIT/ML FlexPen; Inject 15 Units into the skin 2 (two) times daily.  Dispense: 15 mL; Refill: 0  3. Schizoaffective disorder, bipolar type ([place]) F/U with psych NP as indicated. Mood is stable at this time.  - ARIPiprazole (ABILIFY) 15 MG tablet; Take 1 tablet (15 mg total) by mouth at bedtime.  Dispense: 30 tablet; Refill: 0  4. Oropharyngeal dysphagia Continue regular diet with thin liquids. Still crushing pills. She reports that she has a pill crusher at home and can do this herself. I have asked the staff to ensure that she is able to do this prior to discharge.   5. Essential hypertension Controlled.   6. Obstructive sleep apnea syndrome Encouraged to continue with compliance with sleep apnea each evening   7. Cocaine use disorder, moderate, dependence ([place]) Counseled on cessation and the risk to her health.   8. Other hyperlipidemia Continue Lipitor 40 mg qd  9. Insomnia Continues on Remeron 15 mg qhs   Spent 60 min in counseling and coordination on this discharge  Patient is being discharged with the following home health services:  PT OT ST  Patient is being discharged with the following durable medical equipment:  Has rollator and cane   Patient has been advised to f/u with their PCP in 1-2 weeks to for a transitions of care visit.  Social services at their  facility was responsible for arranging this appointment.  Pt was provided with adequate prescriptions of noncontrolled medications to reach the scheduled appointment .  For controlled substances, a limited supply was provided as appropriate for the individual patient.  If the pt normally receives these medications from a pain clinic or has a contract with another physician, these  medications should be received from that clinic or physician only).    Future labs/tests needed:  Needs f/u with PCP

## 2020-01-22 NOTE — Progress Notes (Signed)
Location:  [place] Room Number: 216-A Place of Service:  SNF (31)  Provider: Royal Hawthorn, NP  PCP: [name], MD Patient Care Team: [name], MD as PCP - General (Internal Medicine) Minus Breeding, MD as PCP - Cardiology (Cardiology)  Extended Emergency Contact Information Primary Emergency Contact: [name], [place] Phone: [phone] Relation: Brother Secondary Emergency Contact: [name] Phone: [phone] Relation: Other  Code Status: FULL CODE Goals of care:  Advanced Directive information Advanced Directives 01/06/2020  Does Patient Have a Medical Advance Directive? Yes  Type of Advance Directive (No Data)  Does patient want to make changes to medical advance directive? No - Patient declined  Would patient like information on creating a medical advance directive? -  Some encounter information is confidential and restricted. Go to Review Flowsheets activity to see all data.     Allergies  Allergen Reactions  . Morphine And Related Hives  . Metformin Diarrhea and Other (See Comments)    "Allergic," [facility]    Chief Complaint  Patient presents with  . Discharge Note    Patient is seen for discharge from SNF    HPI:  49 y.o. female      Past Medical History:  Diagnosis Date  . Anxiety   . Chronic lower back pain 04/17/2012   "just got over some; catched when I walked"  . COPD (chronic obstructive pulmonary disease) ([place])   . Critical lower limb ischemia 05/27/2018  . Depression   . Headache(784.0) 04/17/2012   "~ qod; lately waking up in am w/one"  . High cholesterol   . Hypertension   . Migraines 04/17/2012  . Obesity   . Sleep apnea    CPAP  . Stroke ([place])   . Type II diabetes mellitus ([place]) 08/28/2002    Past Surgical History:  Procedure Laterality Date  . ABDOMINAL AORTOGRAM W/LOWER EXTREMITY Left 05/27/2018   Procedure: ABDOMINAL  AORTOGRAM W/LOWER EXTREMITY Runoff and Possible Intervention;  Surgeon: Waynetta Sandy, MD;  Location: [place] CV [facility];  Service: Cardiovascular;  Laterality: Left;  . APPLICATION OF WOUND VAC Left 06/06/2018   Procedure: APPLICATION OF WOUND VAC;  Surgeon: Waynetta Sandy, MD;  Location: [place];  Service: Vascular;  Laterality: Left;  . BIOPSY  04/04/2018   Procedure: BIOPSY;  Surgeon: [name], MD;  Location: [facility] ENDOSCOPY;  Service: Endoscopy;;  . CARDIAC CATHETERIZATION  ~ 2011  . COLONOSCOPY N/A 04/04/2018   Procedure: COLONOSCOPY;  Surgeon: [name], MD;  Location[facility] ENDOSCOPY;  Service: Endoscopy;  Laterality: N/A;  . LOWER EXTREMITY ANGIOGRAM Left 05/27/2018  . PERIPHERAL VASCULAR INTERVENTION Left 05/27/2018   Procedure: PERIPHERAL VASCULAR INTERVENTION;  Surgeon: Waynetta Sandy, MD;  Location: [place] CV LAB;  Service: Cardiovascular;  Laterality: Left;  . POLYPECTOMY  04/04/2018   Procedure: POLYPECTOMY;  Surgeon: [name], MD;  Location: [facility] ENDOSCOPY;  Service: Endoscopy;;  . TRANSMETATARSAL AMPUTATION Left 05/28/2018   Procedure: AMPUTATION TOES THREE, FOUR AND FIVE on left foot;  Surgeon: Waynetta Sandy, MD;  Location: [place];  Service: Vascular;  Laterality: Left;  . WOUND DEBRIDEMENT Left 06/06/2018   Procedure: DEBRIDEMENT WOUND LEFT FOOT;  Surgeon: Waynetta Sandy, MD;  Location: [place];  Service: Vascular;  Laterality: Left;      reports that she has quit smoking. Her smoking use included cigarettes. She has a 7.00 pack-year smoking history.  She has never used smokeless tobacco. She reports current drug use. Drugs: Marijuana and "Crack" cocaine. She reports that she does not drink alcohol. Social History   Socioeconomic History  . Marital status: Single    Spouse name: Not on file  . Number of children: Not on file  . Years of education: Not on file  . Highest education level: Not on file  Occupational  History  . Occupation: disabled  Tobacco Use  . Smoking status: Former Smoker    Packs/day: 0.25    Years: 28.00    Pack years: 7.00    Types: Cigarettes  . Smokeless tobacco: Never Used  Substance and Sexual Activity  . Alcohol use: No    Alcohol/week: 0.0 standard drinks    Comment: rare  . Drug use: Yes    Types: Marijuana, "Crack" cocaine    Comment: marijuana last use days ago; last cocaine several months ago  . Sexual activity: Not Currently    Birth control/protection: None  Other Topics Concern  . Not on file  Social History Narrative   Was a  Quarry manager at [employer]. Lives with godmother.     Social Determinants of Health   Financial Resource Strain:   . Difficulty of Paying Living Expenses:   Food Insecurity:   . Worried About Charity fundraiser in the Last Year:   . Arboriculturist in the Last Year:   Transportation Needs:   . Film/video editor (Medical):   Marland Kitchen Lack of Transportation (Non-Medical):   Physical Activity:   . Days of Exercise per Week:   . Minutes of Exercise per Session:   Stress:   . Feeling of Stress :   Social Connections:   . Frequency of Communication with Friends and Family:   . Frequency of Social Gatherings with Friends and Family:   . Attends Religious Services:   . Active Member of Clubs or Organizations:   . Attends Archivist Meetings:   Marland Kitchen Marital Status:   Intimate Partner Violence:   . Fear of Current or Ex-Partner:   . Emotionally Abused:   Marland Kitchen Physically Abused:   . Sexually Abused:      Allergies  Allergen Reactions  . Morphine And Related Hives  . Metformin Diarrhea and Other (See Comments)    "Allergic," per [facility]    Pertinent  Health Maintenance Due  Topic Date Due  . PAP SMEAR-Modifier  Never done  . OPHTHALMOLOGY EXAM  08/13/2014  . FOOT EXAM  08/20/2019  . INFLUENZA VACCINE  03/28/2020  . HEMOGLOBIN A1C  06/07/2020    Medications: Outpatient Encounter Medications as of 01/22/2020  Medication Sig    . amLODipine (NORVASC) 10 MG tablet Take 10 mg by mouth daily.  . ARIPiprazole (ABILIFY) 15 MG tablet Take 1 tablet (15 mg total) by mouth at bedtime.  Marland Kitchen aspirin EC 81 MG EC tablet Take 1 tablet (81 mg total) by mouth daily.  Marland Kitchen atorvastatin (LIPITOR) 40 MG tablet Take 1 tablet (40 mg total) by mouth daily at 6 PM.  . bisacodyl (DULCOLAX) 10 MG suppository Place 10 mg rectally daily as needed for mild constipation or moderate constipation (if no relief from Milk of Magnesia).  . cilostazol (PLETAL) 100 MG tablet Take 100 mg by mouth 2 (two) times daily.  . cloNIDine (CATAPRES) 0.3 MG tablet Take 0.3 mg by mouth 2 (two) times daily.  . ferrous sulfate 325 (65 FE) MG tablet Take 1 tablet (325 mg total)  by mouth daily with breakfast.  . hydrALAZINE (APRESOLINE) 100 MG tablet Take 100 mg by mouth 3 (three) times daily.  . Insulin Detemir (LEVEMIR) 100 UNIT/ML Pen Inject 15 Units into the skin 2 (two) times daily.  Marland Kitchen lactulose (CHRONULAC) 10 GM/15ML solution Take 30 g by mouth 2 (two) times daily. For Constipation  . lisinopril (ZESTRIL) 40 MG tablet Take 40 mg by mouth daily.  . magnesium hydroxide (MILK OF MAGNESIA) 400 MG/5ML suspension Take 30 mLs by mouth daily as needed for mild constipation.  . metoprolol tartrate (LOPRESSOR) 25 MG tablet Take 12.5 mg by mouth 2 (two) times daily.  . mirtazapine (REMERON) 15 MG tablet Take 15 mg by mouth at bedtime. For Depression  . NON FORMULARY Regular/thin diet consistency  . Nutritional Supplement LIQD Take 120 mLs by mouth daily. MedPass  . Nutritional Supplements (NUTRITIONAL SUPPLEMENT PO) Take 1 each by mouth daily. Magic Cup  . senna-docusate (SENEXON-S) 8.6-50 MG tablet Take 2 tablets by mouth 2 (two) times daily. For Constipation  . Sodium Phosphates (RA SALINE ENEMA) 19-7 GM/118ML ENEM Place 1 enema rectally daily as needed (if no relief from Dulcolax suppository for constipation and call MD if no relief from enema).   No facility-administered  encounter medications on file as of 01/22/2020.    Review of Systems  Vitals:   01/22/20 1427 01/22/20 1428  BP: (!) 160/87 (!) 148/71  Pulse: 76   Resp: 18   Temp: 98.1 F (36.7 C)   TempSrc: Oral   Weight: 187 lb 9.6 oz (85.1 kg)   Height: 5\' 1"  (1.549 m)    Body mass index is 35.45 kg/m. Physical Exam  Labs reviewed: Basic Metabolic Panel: Recent Labs    12/08/19 0342 12/08/19 0342 12/09/19 0332 12/09/19 0332 12/10/19 0500 12/15/19 2043 12/17/19 0000  NA 142   < > 141   < > 142 140 141  K 3.2*   < > 3.8   < > 3.7 3.5 4.2  CL 107   < > 110   < > 110 107 107  CO2 23   < > 21*   < > 23 22 22   GLUCOSE 88   < > 100*  --  85 132*  --   BUN 14   < > 12   < > 12 15 14   CREATININE 1.44*   < > 1.36*   < > 1.48* 1.26* 1.1  CALCIUM 8.9   < > 9.0   < > 9.3 9.1 9.0  MG 2.0  --  2.1  --  2.2  --   --    < > = values in this interval not displayed.   Liver Function Tests: Recent Labs    06/26/19 1638 12/06/19 1728 12/08/19 0342  AST 14* 17 12*  ALT 11 8 8   ALKPHOS 60 49 51  BILITOT 0.9 0.4 1.0  PROT 7.5 7.0 6.6  ALBUMIN 3.5 3.0* 3.1*   Recent Labs    02/11/19 1231  LIPASE 30   No results for input(s): AMMONIA in the last 8760 hours. CBC: Recent Labs    12/09/19 0332 12/09/19 0332 12/10/19 0500 12/15/19 2043 12/17/19 0000  WBC 7.0   < > 7.3 5.4 5.9  NEUTROABS 3.1  --  2.6  --  2  HGB 11.7*   < > 11.8* 11.6* 10.8*  HCT 37.4   < > 37.6 37.3 34*  MCV 73.0*  --  74.3* 73.9*  --   PLT  263   < > 251 222 232   < > = values in this interval not displayed.   CBG: Recent Labs    12/15/19 2312 12/16/19 0813 12/16/19 1153  GLUCAP 147* 62* 120*    Procedures and Imaging Studies During Stay: No results found.  Assessment/Plan:   There are no diagnoses linked to this encounter.   Patient is being discharged with the following home health services:    Patient is being discharged with the following durable medical equipment:    Patient has been advised  to f/u with their PCP in 1-2 weeks to for a transitions of care visit.  Social services at their facility was responsible for arranging this appointment.  Pt was provided with adequate prescriptions of noncontrolled medications to reach the scheduled appointment .  For controlled substances, a limited supply was provided as appropriate for the individual patient.  If the pt normally receives these medications from a pain clinic or has a contract with another physician, these medications should be received from that clinic or physician only).

## 2020-02-17 ENCOUNTER — Emergency Department (HOSPITAL_COMMUNITY)
Admission: EM | Admit: 2020-02-17 | Discharge: 2020-02-18 | Disposition: A | Discharge status: Home / Self Care | Payer: Medicare Other | Attending: Emergency Medicine | Admitting: Emergency Medicine

## 2020-02-17 ENCOUNTER — Encounter (HOSPITAL_COMMUNITY): Payer: Self-pay | Admitting: Emergency Medicine

## 2020-02-17 ENCOUNTER — Other Ambulatory Visit: Payer: Self-pay | Admitting: Adult Health

## 2020-02-17 ENCOUNTER — Other Ambulatory Visit: Payer: Self-pay

## 2020-02-17 DIAGNOSIS — I10 Essential (primary) hypertension: Secondary | ICD-10-CM | POA: Diagnosis not present

## 2020-02-17 DIAGNOSIS — M7541 Impingement syndrome of right shoulder: Secondary | ICD-10-CM | POA: Insufficient documentation

## 2020-02-17 DIAGNOSIS — Z87891 Personal history of nicotine dependence: Secondary | ICD-10-CM | POA: Diagnosis not present

## 2020-02-17 DIAGNOSIS — Z7982 Long term (current) use of aspirin: Secondary | ICD-10-CM | POA: Insufficient documentation

## 2020-02-17 DIAGNOSIS — Z79899 Other long term (current) drug therapy: Secondary | ICD-10-CM | POA: Insufficient documentation

## 2020-02-17 DIAGNOSIS — J449 Chronic obstructive pulmonary disease, unspecified: Secondary | ICD-10-CM | POA: Diagnosis not present

## 2020-02-17 DIAGNOSIS — R29818 Other symptoms and signs involving the nervous system: Secondary | ICD-10-CM | POA: Diagnosis present

## 2020-02-17 DIAGNOSIS — Z794 Long term (current) use of insulin: Secondary | ICD-10-CM | POA: Insufficient documentation

## 2020-02-17 DIAGNOSIS — E119 Type 2 diabetes mellitus without complications: Secondary | ICD-10-CM | POA: Diagnosis not present

## 2020-02-17 LAB — CBC
HCT: 33.8 % — ABNORMAL LOW (ref 36.0–46.0)
Hemoglobin: 10.5 g/dL — ABNORMAL LOW (ref 12.0–15.0)
MCH: 22.5 pg — ABNORMAL LOW (ref 26.0–34.0)
MCHC: 31.1 g/dL (ref 30.0–36.0)
MCV: 72.4 fL — ABNORMAL LOW (ref 80.0–100.0)
Platelets: 388 10*3/uL (ref 150–400)
RBC: 4.67 MIL/uL (ref 3.87–5.11)
RDW: 16.3 % — ABNORMAL HIGH (ref 11.5–15.5)
WBC: 9.2 10*3/uL (ref 4.0–10.5)
nRBC: 0 % (ref 0.0–0.2)

## 2020-02-17 LAB — BASIC METABOLIC PANEL
Anion gap: 12 (ref 5–15)
BUN: 20 mg/dL (ref 6–20)
CO2: 20 mmol/L — ABNORMAL LOW (ref 22–32)
Calcium: 9.1 mg/dL (ref 8.9–10.3)
Chloride: 104 mmol/L (ref 98–111)
Creatinine, Ser: 1.21 mg/dL — ABNORMAL HIGH (ref 0.44–1.00)
GFR calc Af Amer: >60 mL/min (ref >60)
GFR calc non Af Amer: 53 mL/min — ABNORMAL LOW (ref >60)
Glucose, Bld: 140 mg/dL — ABNORMAL HIGH (ref 70–99)
Potassium: 3.6 mmol/L (ref 3.5–5.1)
Sodium: 136 mmol/L (ref 135–145)

## 2020-02-17 LAB — TROPONIN I (HIGH SENSITIVITY): Troponin I (High Sensitivity): <2 ng/L (ref <18)

## 2020-02-17 MED ORDER — SODIUM CHLORIDE 0.9% FLUSH: 3.0000 mL | Freq: Once | INTRAVENOUS | Status: DC

## 2020-02-17 NOTE — ED Notes (Signed)
Pt reports that she is now having CP

## 2020-02-17 NOTE — ED Triage Notes (Signed)
Patient arrives to ED with complaints of right sided arm pain and generalized weakness for the last three days. Patient denies CP and SOB. Hx stroke with left sided hemiparesis.

## 2020-02-18 DIAGNOSIS — M7541 Impingement syndrome of right shoulder: Secondary | ICD-10-CM | POA: Diagnosis not present

## 2020-02-18 LAB — TROPONIN I (HIGH SENSITIVITY): Troponin I (High Sensitivity): 5 ng/L (ref <18)

## 2020-02-18 LAB — URINALYSIS, ROUTINE W REFLEX MICROSCOPIC
Bacteria, UA: NONE SEEN
Bilirubin Urine: NEGATIVE
Glucose, UA: 50 mg/dL — AB
Hgb urine dipstick: NEGATIVE
Ketones, ur: NEGATIVE mg/dL
Leukocytes,Ua: NEGATIVE
Nitrite: NEGATIVE
Protein, ur: 30 mg/dL — AB
Specific Gravity, Urine: 1.024 (ref 1.005–1.030)
pH: 5 (ref 5.0–8.0)

## 2020-02-18 LAB — CBG MONITORING, ED
Glucose-Capillary: 114 mg/dL — ABNORMAL HIGH (ref 70–99)
Glucose-Capillary: 111 mg/dL — ABNORMAL HIGH (ref 70–99)

## 2020-02-18 MED ORDER — METHYLPREDNISOLONE 4 MG PO TBPK
ORAL_TABLET | ORAL | 0 refills | Status: DC
Start: 2020-02-18 — End: 2020-12-27

## 2020-02-18 MED ORDER — HYDROCODONE-ACETAMINOPHEN 5-325 MG PO TABS
1.0000 | ORAL_TABLET | Freq: Once | ORAL | Status: AC
  Administered 2020-02-18: 1 via ORAL
  Filled 2020-02-18: qty 1

## 2020-02-18 MED ORDER — LIDOCAINE 5 % EX PTCH: 1.0000 | MEDICATED_PATCH | CUTANEOUS | 0 refills | Status: DC

## 2020-02-18 MED ORDER — HYDROCODONE-ACETAMINOPHEN 5-325 MG PO TABS: 1.0000 | ORAL_TABLET | Freq: Four times a day (QID) | ORAL | 0 refills | Status: DC | PRN

## 2020-02-18 NOTE — Discharge Instructions (Addendum)
You are seen today for shoulder pain, this is most likely due to impingement syndrome you can see the attached instructions.  Is important that you follow-up with your orthopedics doctor who you are going to see on Monday.  Until then you can use the lidocaine patches.  No way to continue to use NSAIDs as we talked about as prescribed on the bottle, you can use the Norco for breakthrough pain.  It is imperative that you see sparingly.  I do not want you to drive or operate heavy machinery when using these medications.  Also use the steroids as prescribed.  If you have any new or worsening concerning symptoms please come back to the emergency department.

## 2020-02-18 NOTE — ED Notes (Signed)
Pt to go home with PTAR, states that she usually leaves ED with PTAR d/t L sided weakness from old stroke preventing use of public transport. Family has no car for pick up. No oxygen needed for pick up.

## 2020-02-18 NOTE — ED Notes (Signed)
Patient verbalizes understanding of discharge instructions. Opportunity for questioning and answers were provided. Armband removed by staff, pt discharged from ED to home. States there will be family there to open door for PTA

## 2020-02-18 NOTE — ED Provider Notes (Signed)
[facility] EMERGENCY DEPARTMENT Provider Note   CSN: [id number] Arrival date & time: 02/17/20  1902     History Chief Complaint  Patient presents with  . Arm Pain  . Weakness    Diamond Rice is a 49 y.o. female with pertinent past medical history of COPD, depression, migraines, obesity, type 2 diabetes, CVA in April 2021 with residual left-sided hemiparesis that presents the emergency department today for right arm pain and weakness.  Patient states that her arm pain started 3 days ago, states that it starts in her shoulder and elbow and radiates down her arm.  Also states that she is having shoulder pain.  States that this was her "good arm" since she had a stroke with left-sided hemiparesis.  States that the pain started at rest, denies any traumatic event.  Describes pain as a stabbing pain that is constant.  States that she has tried Tylenol and ibuprofen without relief.  Denies any paresthesias. Denies any trauma to that area. Denies any back pain, abdominal pain, nausea, vomiting, chest pain, shortness of breath, headache, vision changes.  Patient has a chronic speech impediment from previous stroke, states that this is unchanged.  Denies any new slurred speech, facial droop, gait changes.  HPI     Past Medical History:  Diagnosis Date  . Anxiety   . Chronic lower back pain 04/17/2012   "just got over some; catched when I walked"  . COPD (chronic obstructive pulmonary disease) ([place])   . Critical lower limb ischemia 05/27/2018  . Depression   . Headache(784.0) 04/17/2012   "~ qod; lately waking up in am w/one"  . High cholesterol   . Hypertension   . Migraines 04/17/2012  . Obesity   . Sleep apnea    CPAP  . Stroke ([place])   . Type II diabetes mellitus ([place]) 08/28/2002    Patient Active Problem List   Diagnosis Date Noted  . Acute CVA (cerebrovascular accident) ([place]) 12/06/2019  . AKI (acute kidney injury) ([place]) 12/06/2019  . LVH (left ventricular  hypertrophy) 08/10/2019  . Hemorrhagic stroke ([place])   . Diastolic dysfunction   . Type 2 diabetes mellitus ([place])   . Anemia of chronic disease   . Chronic obstructive pulmonary disease ([place])   . ICH (intracerebral hemorrhage) ([place]) 08/30/2018  . Hypertensive heart disease without heart failure 07/04/2018  . Nonrheumatic aortic valve insufficiency 07/04/2018  . Esophageal abnormality   . Class 2 severe obesity due to excess calories with serious comorbidity and body mass index (BMI) of 35.0 to 35.9 in adult [facility])   . Peripheral vascular disease ([place])   . History of osteomyelitis   . History of amputation of lesser toe ([place])   . Pulmonary embolism ([place]) 06/22/2018  . Microcytic anemia 06/17/2018  . At risk for adverse drug reaction 06/11/2018  . Critical lower limb ischemia 05/27/2018  . GI bleeding 04/02/2018  . Acute renal failure (ARF) ([place]) 04/02/2018  . Hypokalemia 04/02/2018  . Polysubstance abuse ([place]) 04/02/2018  . Diabetic foot ulcer ([place]) 04/02/2018  . Cannabis use disorder, moderate, dependence ([place]) 05/19/2017  . Cocaine use disorder, moderate, dependence ([place]) 05/19/2017  . Schizoaffective disorder, bipolar type ([place]) 10/07/2015  . OSA (obstructive sleep apnea) 06/09/2015  . Vitamin D deficiency 01/06/2015  . Bilateral knee pain 01/05/2015  . Numbness and tingling in right hand 01/05/2015  . Insomnia 12/07/2014  . Right-sided lacunar infarction ([place]) 09/15/2014  . Left hemiparesis ([place]) 09/15/2014  . Dyspnea   .  Back pain 09/11/2013  . Psychoactive substance-induced organic mood disorder ([place]) 05/28/2013  . Cocaine abuse with cocaine-induced mood disorder ([place]) 05/28/2013  . Cannabis dependence with cannabis-induced anxiety disorder ([place]) 05/28/2013  . Morbid obesity with BMI of 45.0-49.9, adult ([place]) 04/25/2012  . Chest pain 04/17/2012  . Hypertension 04/17/2012  . DM (diabetes mellitus) with peripheral vascular complication ([place]) 52/77/8242  . Hyperlipidemia  04/17/2012  . Tobacco use 04/17/2012    Past Surgical History:  Procedure Laterality Date  . ABDOMINAL AORTOGRAM W/LOWER EXTREMITY Left 05/27/2018   Procedure: ABDOMINAL AORTOGRAM W/LOWER EXTREMITY Run[place] and Possible Intervention;  Surgeon: Waynetta Sandy, MD;  Location: [place] CV [facility];  Service: Cardiovascular;  Laterality: Left;  . APPLICATION OF WOUND VAC Left 06/06/2018   Procedure: APPLICATION OF WOUND VAC;  Surgeon: Waynetta Sandy, MD;  Location: [place];  Service: Vascular;  Laterality: Left;  . BIOPSY  04/04/2018   Procedure: BIOPSY;  Surgeon: [name], MD;  Location: [facility] ENDOSCOPY;  Service: Endoscopy;;  . CARDIAC CATHETERIZATION  ~ 2011  . COLONOSCOPY N/A 04/04/2018   Procedure: COLONOSCOPY;  Surgeon: [name], MD;  Location: [facility] ENDOSCOPY;  Service: Endoscopy;  Laterality: N/A;  . LOWER EXTREMITY ANGIOGRAM Left 05/27/2018  . PERIPHERAL VASCULAR INTERVENTION Left 05/27/2018   Procedure: PERIPHERAL VASCULAR INTERVENTION;  Surgeon: Waynetta Sandy, MD;  Location: [place] [facility];  Service: Cardiovascular;  Laterality: Left;  . POLYPECTOMY  04/04/2018   Procedure: POLYPECTOMY;  Surgeon: [name], MD;  Location: [facility] ENDOSCOPY;  Service: Endoscopy;;  . TRANSMETATARSAL AMPUTATION Left 05/28/2018   Procedure: AMPUTATION TOES THREE, FOUR AND FIVE on left foot;  Surgeon: Waynetta Sandy, MD;  Location: [place];  Service: Vascular;  Laterality: Left;  . WOUND DEBRIDEMENT Left 06/06/2018   Procedure: DEBRIDEMENT WOUND LEFT FOOT;  Surgeon: Waynetta Sandy, MD;  Location: [place];  Service: Vascular;  Laterality: Left;     OB History   No obstetric history on file.     Family History  Problem Relation Age of Onset  . Stroke Brother 74  . Stroke Brother 95  . Heart attack Mother 42  . Stroke Father 79    Social History   Tobacco Use  . Smoking status: Former Smoker    Packs/day: 0.25    Years: 28.00    Pack years: 7.00      Types: Cigarettes  . Smokeless tobacco: Never Used  Vaping Use  . Vaping Use: Never used  Substance Use Topics  . Alcohol use: No    Alcohol/week: 0.0 standard drinks    Comment: rare  . Drug use: Yes    Types: Marijuana, "Crack" cocaine    Comment: marijuana last use days ago; last cocaine several months ago    Home Medications Prior to Admission medications   Medication Sig Start Date End Date Taking? Authorizing Provider  amLODipine (NORVASC) 10 MG tablet Take 1 tablet (10 mg total) by mouth daily. 01/22/20   Royal Hawthorn, NP  ARIPiprazole (ABILIFY) 15 MG tablet Take 1 tablet (15 mg total) by mouth at bedtime. 01/22/20   Royal Hawthorn, NP  aspirin EC 81 MG EC tablet Take 1 tablet (81 mg total) by mouth daily. 09/21/18   Donzetta Starch, NP  atorvastatin (LIPITOR) 40 MG tablet Take 1 tablet (40 mg total) by mouth daily at 6 PM. 01/22/20   Royal Hawthorn, NP  bisacodyl (DULCOLAX) 10 MG suppository Place 10 mg rectally daily as needed for mild constipation or moderate constipation (  if no relief from Milk of Magnesia).    [provider]  cilostazol (PLETAL) 100 MG tablet Take 1 tablet (100 mg total) by mouth 2 (two) times daily. 01/22/20   Royal Hawthorn, NP  cloNIDine (CATAPRES) 0.3 MG tablet Take 1 tablet (0.3 mg total) by mouth 2 (two) times daily. 01/22/20   Royal Hawthorn, NP  ferrous sulfate 325 (65 FE) MG tablet Take 1 tablet (325 mg total) by mouth daily with breakfast. 08/26/18   Medina-Vargas, Monina C, NP  hydrALAZINE (APRESOLINE) 100 MG tablet Take 1 tablet (100 mg total) by mouth 3 (three) times daily. 01/22/20   Royal Hawthorn, NP  HYDROcodone-acetaminophen (NORCO/VICODIN) 5-325 MG tablet Take 1 tablet by mouth every 6 (six) hours as needed for severe pain. 02/18/20   Alfredia Client, PA-C  insulin detemir (LEVEMIR) 100 UNIT/ML FlexPen Inject 15 Units into the skin 2 (two) times daily. 01/22/20   Royal Hawthorn, NP  lactulose (CHRONULAC) 10 GM/15ML solution Take 30  g by mouth 2 (two) times daily. For Constipation    [provider]  lidocaine (LIDODERM) 5 % Place 1 patch onto the skin daily. Remove & Discard patch within 12 hours or as directed by MD 02/18/20   Alfredia Client, PA-C  lisinopril (ZESTRIL) 40 MG tablet Take 1 tablet (40 mg total) by mouth daily. 01/22/20   Royal Hawthorn, NP  magnesium hydroxide (MILK OF MAGNESIA) 400 MG/5ML suspension Take 30 mLs by mouth daily as needed for mild constipation.    [provider]  methylPREDNISolone (MEDROL DOSEPAK) 4 MG TBPK tablet Per dose pack instructions 02/18/20   Alfredia Client, PA-C  metoprolol tartrate (LOPRESSOR) 25 MG tablet Take 0.5 tablets (12.5 mg total) by mouth 2 (two) times daily. 01/22/20   Royal Hawthorn, NP  mirtazapine (REMERON) 15 MG tablet Take 1 tablet (15 mg total) by mouth at bedtime. For Depression 01/22/20   Royal Hawthorn, NP  Nutritional Supplement LIQD Take 120 mLs by mouth daily. MedPass    [provider]  Nutritional Supplements (NUTRITIONAL SUPPLEMENT PO) Take 1 each by mouth daily. Magic Cup    [provider]  senna-docusate (SENEXON-S) 8.6-50 MG tablet Take 2 tablets by mouth 2 (two) times daily. For Constipation 12/30/19   [provider]    Allergies    Morphine and related and Metformin  Review of Systems   Review of Systems  Constitutional: Negative for chills, diaphoresis, fatigue and fever.  HENT: Negative for congestion, sore throat and trouble swallowing.   Eyes: Negative for pain and visual disturbance.  Respiratory: Negative for cough, shortness of breath and wheezing.   Cardiovascular: Negative for chest pain, palpitations and leg swelling.  Gastrointestinal: Negative for abdominal distention, abdominal pain, diarrhea, nausea and vomiting.  Genitourinary: Negative for difficulty urinating.  Musculoskeletal: Positive for arthralgias (R arm pain). Negative for back pain, neck pain and neck stiffness.  Skin: Negative for  pallor.  Neurological: Negative for dizziness, speech difficulty, weakness and headaches.  Psychiatric/Behavioral: Negative for confusion.    Physical Exam Updated Vital Signs BP (!) 171/96   Pulse 79   Temp 99.1 F (37.3 C) (Oral)   Resp 18   Ht 5\' 1"  (1.549 m)   Wt 85.7 kg   SpO2 99%   BMI 35.71 kg/m   Physical Exam Constitutional:      General: She is not in acute distress.    Appearance: Normal appearance. She is not ill-appearing, toxic-appearing or diaphoretic.  HENT:     Mouth/Throat:  Mouth: Mucous membranes are moist.     Pharynx: Oropharynx is clear.  Eyes:     General: No scleral icterus.    Extraocular Movements: Extraocular movements intact.     Pupils: Pupils are equal, round, and reactive to light.  Cardiovascular:     Rate and Rhythm: Normal rate and regular rhythm.     Pulses: Normal pulses.     Heart sounds: Normal heart sounds.  Pulmonary:     Effort: Pulmonary effort is normal. No respiratory distress.     Breath sounds: Normal breath sounds. No stridor. No wheezing, rhonchi or rales.  Chest:     Chest wall: No tenderness.  Abdominal:     General: Abdomen is flat. There is no distension.     Palpations: Abdomen is soft.     Tenderness: There is no abdominal tenderness. There is no guarding or rebound.  Musculoskeletal:        General: No swelling or tenderness. Normal range of motion.     Cervical back: Normal range of motion and neck supple. No rigidity.     Right lower leg: No edema.     Left lower leg: No edema.     Comments: Patient with left-sided hemiparesis.  Right trapezius is tender, no muscle spasm noted, right arm is tender throughout from shoulder up until wrist.  Patient is able to actively and passively move right arm in all directions.  Strength 4/5 of shoulder, elbow, wrist, fingers.  Normal sensation throughout.  Skin:    General: Skin is warm and dry.     Capillary Refill: Capillary refill takes less than 2 seconds.      Coloration: Skin is not pale.  Neurological:     General: No focal deficit present.     Mental Status: She is alert and oriented to person, place, and time. Mental status is at baseline.     Comments: Alert and oriented times 3. Mild speech impediment, no slurred speech, aphasia or new dysphasia. No facial droop. CNIII-XII grossly intact. Normal strength and sensation of bilateral lower extremities. RUE 4/5. LUE 3/5. Normal finger to nose bilaterally. Negative pronator drift. Negative Romberg sign.   Psychiatric:        Mood and Affect: Mood normal.        Behavior: Behavior normal.     ED Results / Procedures / Treatments   Labs (all labs ordered are listed, but only abnormal results are displayed) Labs Reviewed  BASIC METABOLIC PANEL - Abnormal; Notable for the following components:      Result Value   CO2 20 (*)    Glucose, Bld 140 (*)    Creatinine, Ser 1.21 (*)    GFR calc non Af Amer 53 (*)    All other components within normal limits  CBC - Abnormal; Notable for the following components:   Hemoglobin 10.5 (*)    HCT 33.8 (*)    MCV 72.4 (*)    MCH 22.5 (*)    RDW 16.3 (*)    All other components within normal limits  URINALYSIS, ROUTINE W REFLEX MICROSCOPIC - Abnormal; Notable for the following components:   APPearance HAZY (*)    Glucose, UA 50 (*)    Protein, ur 30 (*)    All other components within normal limits  CBG MONITORING, ED - Abnormal; Notable for the following components:   Glucose-Capillary 114 (*)    All other components within normal limits  CBG MONITORING, ED - Abnormal; Notable for  the following components:   Glucose-Capillary 111 (*)    All other components within normal limits  TROPONIN I (HIGH SENSITIVITY)  TROPONIN I (HIGH SENSITIVITY)    EKG EKG Interpretation  Date/Time:  Tuesday February 17 2020 21:21:44 EDT Ventricular Rate:  72 PR Interval:  198 QRS Duration: 82 QT Interval:  400 QTC Calculation: 438 R Axis:   -30 Text  Interpretation: Normal sinus rhythm Left axis deviation Anterolateral infarct , age undetermined Abnormal ECG When compared with ECG of 12/15/2019, No significant change was found Confirmed by Delora Fuel ([id number]) on 02/17/2020 11:55:06 PM   Radiology No results found.  Procedures Procedures (including critical care time)  Medications Ordered in ED Medications  sodium chloride flush (NS) 0.9 % injection 3 mL (has no administration in time range)  HYDROcodone-acetaminophen (NORCO/VICODIN) 5-325 MG per tablet 1 tablet (1 tablet Oral Given 02/18/20 1111)    ED Course  I have reviewed the triage vital signs and the nursing notes.  Pertinent labs & imaging results that were available during my care of the patient were reviewed by me and considered in my medical decision making (see chart for details).    MDM Rules/Calculators/A&P                           Diamond Rice is a 49 y.o. female with pertinent past medical history of COPD, depression, migraines, obesity, type 2 diabetes, CVA in April 2021 with residual left-sided hemiparesis that presents the emergency department today for right arm pain and weakness.  Physical exam suggestive of impingement syndrome.  No paresthesias.  Neuro exam without any changes from previous stroke.  CBC and CMP stable.  EKG unchanged from last time.  Hemoglobin at baseline.  Creatinine is at baseline.  Patient states that she has taken Vicodin before and has not had any reactions.  Will administer that today.  Upon reassessment, patient states that Vicodin did make her feel better.  Was observed for 2 hours as bending discharged after Vicodin..  Patient to be discharged on lidocaine patch, small amount of Vicodin, Medrol dose pack.  Patient states that she has an orthopedic appointment already scheduled for Monday.  .Doubt need for further emergent work up at this time. I explained the diagnosis and have given explicit precautions to return to the ER  including for any other new or worsening symptoms. The patient understands and accepts the medical plan as it's been dictated and I have answered their questions. Discharge instructions concerning home care and prescriptions have been given. The patient is STABLE and is discharged to home in good condition.  I discussed this case with my attending physician who cosigned this note including patient's presenting symptoms, physical exam, and planned diagnostics and interventions. Attending physician stated agreement with plan or made changes to plan which were implemented.   Attending physician assessed patient at bedside.   s final Clinical Impression(s) / ED Diagnoses Final diagnoses:  Impingement syndrome of right shoulder    Rx / DC Orders ED Discharge Orders         Ordered    HYDROcodone-acetaminophen (NORCO/VICODIN) 5-325 MG tablet  Every 6 hours PRN     Discontinue  Reprint     02/18/20 1147    lidocaine (LIDODERM) 5 %  Every 24 hours     Discontinue  Reprint     02/18/20 1148    methylPREDNISolone (MEDROL DOSEPAK) 4 MG TBPK tablet  Discontinue  Reprint     02/18/20 [place], PA-C 02/18/20 1358    Charlesetta Shanks, MD 02/18/20 1625

## 2020-02-18 NOTE — ED Provider Notes (Signed)
Medical screening examination/treatment/procedure(s) were conducted as a shared visit with non-physician practitioner(s) and myself.  I personally evaluated the patient during the encounter.  EKG Interpretation  Date/Time:  Tuesday February 17 2020 21:21:44 EDT Ventricular Rate:  72 PR Interval:  198 QRS Duration: 82 QT Interval:  400 QTC Calculation: 438 R Axis:   -30 Text Interpretation: Normal sinus rhythm Left axis deviation Anterolateral infarct , age undetermined Abnormal ECG When compared with ECG of 12/15/2019, No significant change was found Confirmed by Delora Fuel ([id number]) on 02/17/2020 11:55:06 PM Patient reports she is having severe pain that goes from her shoulder to her elbow into the wrist. Pain is sharp in nature. She reports it is getting the point where it hard for her to try to lift her fork up to feed herself. Causes her severe pain in her shoulder. Patient has had multiple strokes. The most recent stroke was last month. She reports she is doing physical therapy and has aides at home. She had been using a cane but now she is instructed to use her walker more often. She is having to do almost all daily tasks with the right upper extremity. She has tried ibuprofen and Tylenol for pain without relief.  Patient is alert. She is extremely deconditioned and in poor physical condition for age. No respiratory distress at rest. Voice is gravelly. Airway is patent. Neck is supple. Patient denies focal tenderness to palpation of the cervical spine. She endorses significant tenderness at the lateral paraspinous muscle bodies on the left in the trapezius and distally toward the shoulder. She reports this increases the pain in her shoulder and elbow. Patient endorses pain on the anterior biceps insertion area of the shoulder. No pain to palpation in the axilla. No axillary lymphadenopathy. Pain is not exacerbated by range of motion of the neck. Hand is warm and dry. Pulses 2+. Soft tissues of the  right upper extremity are soft and pliable without erythema. No joint effusions. Grip strength is intact but seems slightly weak. Pain is worse with abduction and forward flexion. This causes pain into the shoulder that radiates to the elbow. Heart is regular. 2\6 systolic ejection murmur. Lungs grossly clear anteriorly. No significant lower extremity edema.  Patient has severe comorbid illness profile. At this time, pain is consistent with musculoskeletal pain and radiculopathy which I suspect is overuse syndrome given the patient's now significant limitations in self-care requiring use of a walker and ADLs predominantly dependent on right upper extremity function.  Blood sugars have been consistently pretty well controlled. Will try a Medrol Dosepak with recommendation for careful CBG monitoring. Vicodin 1 every 6 as needed pain. Recommend follow-up with neurology for EMG testing. At this time, patient does not have any cervical pain or exacerbation of symptoms by range of motion of the neck, and I have lower suspicion for disc herniation. Patient will need close follow-up due to high risk medical profile.   Charlesetta Shanks, MD 02/18/20 [phone]

## 2020-02-28 ENCOUNTER — Other Ambulatory Visit: Payer: Self-pay | Admitting: Adult Health

## 2020-03-09 ENCOUNTER — Emergency Department (HOSPITAL_COMMUNITY)
Admission: EM | Admit: 2020-03-09 | Discharge: 2020-03-09 | Disposition: A | Discharge status: Home / Self Care | Payer: Medicare Other | Attending: Emergency Medicine | Admitting: Emergency Medicine

## 2020-03-09 ENCOUNTER — Encounter (HOSPITAL_COMMUNITY): Payer: Self-pay

## 2020-03-09 ENCOUNTER — Other Ambulatory Visit: Payer: Self-pay

## 2020-03-09 DIAGNOSIS — I10 Essential (primary) hypertension: Secondary | ICD-10-CM | POA: Diagnosis not present

## 2020-03-09 DIAGNOSIS — J449 Chronic obstructive pulmonary disease, unspecified: Secondary | ICD-10-CM | POA: Diagnosis not present

## 2020-03-09 DIAGNOSIS — Z87891 Personal history of nicotine dependence: Secondary | ICD-10-CM | POA: Diagnosis not present

## 2020-03-09 DIAGNOSIS — Z7982 Long term (current) use of aspirin: Secondary | ICD-10-CM | POA: Diagnosis not present

## 2020-03-09 DIAGNOSIS — Z794 Long term (current) use of insulin: Secondary | ICD-10-CM | POA: Diagnosis not present

## 2020-03-09 DIAGNOSIS — E119 Type 2 diabetes mellitus without complications: Secondary | ICD-10-CM | POA: Insufficient documentation

## 2020-03-09 NOTE — ED Provider Notes (Signed)
[facility] EMERGENCY DEPARTMENT Provider Note   CSN: [id number] Arrival date & time: 03/09/20  1549     History Chief Complaint  Patient presents with  . Hypertension    Diamond Rice is a 49 y.o. female with past medical history of hypertension, cerebrovascular disease status post CVA, type 2 diabetes, COPD, presenting to the ED via EMS from home for hypertension.  Patient states she slept in and woke up late, therefore taking her blood pressure medications later than usual.  She states home health took her blood pressure about 15 minutes after taking her medicine, and her blood pressure was noted to be high.  They called EMS.  She states they were worried about hypertension because of her history of stroke.  She states she feels well, has no new complaints or symptoms, including no chest pain, shortness of breath, headache, new strokelike symptoms.    The history is provided by the patient.       Past Medical History:  Diagnosis Date  . Anxiety   . Chronic lower back pain 04/17/2012   "just got over some; catched when I walked"  . COPD (chronic obstructive pulmonary disease) ([place])   . Critical lower limb ischemia 05/27/2018  . Depression   . Headache(784.0) 04/17/2012   "~ qod; lately waking up in am w/one"  . High cholesterol   . Hypertension   . Migraines 04/17/2012  . Obesity   . Sleep apnea    CPAP  . Stroke ([place])   . Type II diabetes mellitus ([place]) 08/28/2002    Patient Active Problem List   Diagnosis Date Noted  . Acute CVA (cerebrovascular accident) ([place]) 12/06/2019  . AKI (acute kidney injury) ([place]) 12/06/2019  . LVH (left ventricular hypertrophy) 08/10/2019  . Hemorrhagic stroke ([place])   . Diastolic dysfunction   . Type 2 diabetes mellitus ([place])   . Anemia of chronic disease   . Chronic obstructive pulmonary disease ([place])   . ICH (intracerebral hemorrhage) ([place]) 08/30/2018  . Hypertensive heart disease without heart failure 07/04/2018    . Nonrheumatic aortic valve insufficiency 07/04/2018  . Esophageal abnormality   . Class 2 severe obesity due to excess calories with serious comorbidity and body mass index (BMI) of 35.0 to 35.9 in adult [facility])   . Peripheral vascular disease ([place])   . History of osteomyelitis   . History of amputation of lesser toe ([place])   . Pulmonary embolism ([place]) 06/22/2018  . Microcytic anemia 06/17/2018  . At risk for adverse drug reaction 06/11/2018  . Critical lower limb ischemia 05/27/2018  . GI bleeding 04/02/2018  . Acute renal failure (ARF) ([place]) 04/02/2018  . Hypokalemia 04/02/2018  . Polysubstance abuse ([place]) 04/02/2018  . Diabetic foot ulcer ([place]) 04/02/2018  . Cannabis use disorder, moderate, dependence ([place]) 05/19/2017  . Cocaine use disorder, moderate, dependence ([place]) 05/19/2017  . Schizoaffective disorder, bipolar type ([place]) 10/07/2015  . OSA (obstructive sleep apnea) 06/09/2015  . Vitamin D deficiency 01/06/2015  . Bilateral knee pain 01/05/2015  . Numbness and tingling in right hand 01/05/2015  . Insomnia 12/07/2014  . Right-sided lacunar infarction ([place]) 09/15/2014  . Left hemiparesis ([place]) 09/15/2014  . Dyspnea   . Back pain 09/11/2013  . Psychoactive substance-induced organic mood disorder ([place]) 05/28/2013  . Cocaine abuse with cocaine-induced mood disorder ([place]) 05/28/2013  . Cannabis dependence with cannabis-induced anxiety disorder ([place]) 05/28/2013  . Morbid obesity with BMI of 45.0-49.9, adult ([place]) 04/25/2012  . Chest pain 04/17/2012  .  Hypertension 04/17/2012  . DM (diabetes mellitus) with peripheral vascular complication ([place]) 33/54/5625  . Hyperlipidemia 04/17/2012  . Tobacco use 04/17/2012    Past Surgical History:  Procedure Laterality Date  . ABDOMINAL AORTOGRAM W/LOWER EXTREMITY Left 05/27/2018   Procedure: ABDOMINAL AORTOGRAM W/LOWER EXTREMITY Runoff and Possible Intervention;  Surgeon: Waynetta Sandy, MD;  Location: [facility];   Service: Cardiovascular;  Laterality: Left;  . APPLICATION OF WOUND VAC Left 06/06/2018   Procedure: APPLICATION OF WOUND VAC;  Surgeon: Waynetta Sandy, MD;  Location: [facility];  Service: Vascular;  Laterality: Left;  . BIOPSY  04/04/2018   Procedure: BIOPSY;  Surgeon: [name], MD;  Location: [facility] ENDOSCOPY;  Service: Endoscopy;;  . CARDIAC CATHETERIZATION  ~ 2011  . COLONOSCOPY N/A 04/04/2018   Procedure: COLONOSCOPY;  Surgeon: [name], MD;  Location: [facility] ENDOSCOPY;  Service: Endoscopy;  Laterality: N/A;  . LOWER EXTREMITY ANGIOGRAM Left 05/27/2018  . PERIPHERAL VASCULAR INTERVENTION Left 05/27/2018   Procedure: PERIPHERAL VASCULAR INTERVENTION;  Surgeon: Waynetta Sandy, MD;  Location: [facility];  Service: Cardiovascular;  Laterality: Left;  . POLYPECTOMY  04/04/2018   Procedure: POLYPECTOMY;  Surgeon: [name], MD;  Location: [facility] ENDOSCOPY;  Service: Endoscopy;;  . TRANSMETATARSAL AMPUTATION Left 05/28/2018   Procedure: AMPUTATION TOES THREE, FOUR AND FIVE on left foot;  Surgeon: Waynetta Sandy, MD;  Location: [facility];  Service: Vascular;  Laterality: Left;  . WOUND DEBRIDEMENT Left 06/06/2018   Procedure: DEBRIDEMENT WOUND LEFT FOOT;  Surgeon: Waynetta Sandy, MD;  Location: [facility];  Service: Vascular;  Laterality: Left;     OB History   No obstetric history on file.     Family History  Problem Relation Age of Onset  . Stroke Brother 28  . Stroke Brother 1  . Heart attack Mother 71  . Stroke Father 4    Social History   Tobacco Use  . Smoking status: Former Smoker    Packs/day: 0.25    Years: 28.00    Pack years: 7.00    Types: Cigarettes  . Smokeless tobacco: Never Used  Vaping Use  . Vaping Use: Never used  Substance Use Topics  . Alcohol use: No    Alcohol/week: 0.0 standard drinks    Comment: rare  . Drug use: Yes    Types: Marijuana, "Crack" cocaine    Comment: marijuana last use days ago; last cocaine  several months ago    Home Medications Prior to Admission medications   Medication Sig Start Date End Date Taking? Authorizing Provider  amLODipine (NORVASC) 10 MG tablet Take 1 tablet (10 mg total) by mouth daily. 01/22/20   Royal Hawthorn, NP  ARIPiprazole (ABILIFY) 15 MG tablet Take 1 tablet (15 mg total) by mouth at bedtime. 01/22/20   Royal Hawthorn, NP  aspirin EC 81 MG EC tablet Take 1 tablet (81 mg total) by mouth daily. 09/21/18   Donzetta Starch, NP  atorvastatin (LIPITOR) 40 MG tablet Take 1 tablet (40 mg total) by mouth daily at 6 PM. 01/22/20   Royal Hawthorn, NP  bisacodyl (DULCOLAX) 10 MG suppository Place 10 mg rectally daily as needed for mild constipation or moderate constipation (if no relief from Milk of Magnesia).    [provider]  cilostazol (PLETAL) 100 MG tablet Take 1 tablet (100 mg total) by mouth 2 (two) times daily. 01/22/20   Royal Hawthorn, NP  cloNIDine (CATAPRES) 0.3 MG tablet Take 1 tablet (0.3 mg total) by mouth  2 (two) times daily. 01/22/20   Royal Hawthorn, NP  ferrous sulfate 325 (65 FE) MG tablet Take 1 tablet (325 mg total) by mouth daily with breakfast. 08/26/18   Medina-Vargas, Monina C, NP  hydrALAZINE (APRESOLINE) 100 MG tablet Take 1 tablet (100 mg total) by mouth 3 (three) times daily. 01/22/20   Royal Hawthorn, NP  HYDROcodone-acetaminophen (NORCO/VICODIN) 5-325 MG tablet Take 1 tablet by mouth every 6 (six) hours as needed for severe pain. 02/18/20   Alfredia Client, PA-C  insulin detemir (LEVEMIR) 100 UNIT/ML FlexPen Inject 15 Units into the skin 2 (two) times daily. 01/22/20   Royal Hawthorn, NP  lactulose (CHRONULAC) 10 GM/15ML solution Take 30 g by mouth 2 (two) times daily. For Constipation    [provider]  lidocaine (LIDODERM) 5 % Place 1 patch onto the skin daily. Remove & Discard patch within 12 hours or as directed by MD 02/18/20   Alfredia Client, PA-C  lisinopril (ZESTRIL) 40 MG tablet Take 1 tablet (40 mg total) by mouth  daily. 01/22/20   Royal Hawthorn, NP  magnesium hydroxide (MILK OF MAGNESIA) 400 MG/5ML suspension Take 30 mLs by mouth daily as needed for mild constipation.    [provider]  methylPREDNISolone (MEDROL DOSEPAK) 4 MG TBPK tablet Per dose pack instructions 02/18/20   Alfredia Client, PA-C  metoprolol tartrate (LOPRESSOR) 25 MG tablet Take 0.5 tablets (12.5 mg total) by mouth 2 (two) times daily. 01/22/20   Royal Hawthorn, NP  mirtazapine (REMERON) 15 MG tablet Take 1 tablet (15 mg total) by mouth at bedtime. For Depression 01/22/20   Royal Hawthorn, NP  Nutritional Supplement LIQD Take 120 mLs by mouth daily. MedPass    [provider]  Nutritional Supplements (NUTRITIONAL SUPPLEMENT PO) Take 1 each by mouth daily. Magic Cup    [provider]  senna-docusate (SENEXON-S) 8.6-50 MG tablet Take 2 tablets by mouth 2 (two) times daily. For Constipation 12/30/19   [provider]    Allergies    Morphine and related and Metformin  Review of Systems   Review of Systems  Neurological: Positive for weakness (left sided- chronic, unchanged).  All other systems reviewed and are negative.   Physical Exam Updated Vital Signs BP 126/67   Pulse 76   Temp 98.6 F (37 C) (Oral)   Resp 12   Ht 5\' 1"  (1.549 m)   Wt 86 kg   SpO2 99%   BMI 35.82 kg/m   Physical Exam Vitals and nursing note reviewed.  Constitutional:      General: She is not in acute distress.    Appearance: She is well-developed.  HENT:     Head: Normocephalic and atraumatic.  Eyes:     Conjunctiva/sclera: Conjunctivae normal.  Cardiovascular:     Rate and Rhythm: Normal rate and regular rhythm.     Heart sounds: Murmur heard.   Pulmonary:     Effort: Pulmonary effort is normal. No respiratory distress.  Abdominal:     Palpations: Abdomen is soft.  Skin:    General: Skin is warm.  Neurological:     Mental Status: She is alert.     Comments: slurred speech (baseline per patient), left  sided facial droop with decreased sensation (baseline per patient), left upper and lower extremity 4/5 strength (baseline)  Psychiatric:        Behavior: Behavior normal.     ED Results / Procedures / Treatments   Labs (all labs ordered are listed, but only abnormal results  are displayed) Labs Reviewed - No data to display  EKG None  Radiology No results found.  Procedures Procedures (including critical care time)  Medications Ordered in ED Medications - No data to display  ED Course  I have reviewed the triage vital signs and the nursing notes.  Pertinent labs & imaging results that were available during my care of the patient were reviewed by me and considered in my medical decision making (see chart for details).    MDM Rules/Calculators/A&P                          Patient presenting from home via EMS after home health called out for hypertension.  Patient states she woke up late today and took her blood pressure medications later than usual.  She states it was only about 15 minutes after taking her medicines that the home health nurse checked her blood pressure and it was still high.  She states she feels well otherwise, is not having any chest pain or shortness of breath, no new neuro symptoms concerning for stroke.  She has baseline left-sided weakness, and similar findings on exam when compared with hospitalist note during admission in april.  EMS noted hypertension of 198/22 on arrival to her house, however on arrival to the ED her blood pressure is 126/67.  She has no complaints and is in no distress.  Do not believe any further work-up is indicated at this time. Hypertension was likely due to not taking her medications and it is now normalized.  Patient is agreeable with plan, appropriate for discharge.  Patient discussed with Dr. Regenia Skeeter. Final Clinical Impression(s) / ED Diagnoses Final diagnoses:  Hypertension, unspecified type    Rx / DC Orders ED Discharge  Orders    None       Jamison Soward, [place] N, PA-C 03/09/20 1658    Sherwood Gambler, MD 03/09/20 2320

## 2020-03-09 NOTE — Discharge Instructions (Addendum)
Your blood pressure is normal.  Please continue taking your medications as directed.

## 2020-03-09 NOTE — ED Notes (Signed)
Patient verbalizes understanding of discharge instructions. Opportunity for questioning and answers were provided. Pt is now awaiting for transport to residence.

## 2020-03-09 NOTE — ED Triage Notes (Signed)
Pt BIB [facility] from home after [facility] called EMS for HTN.  Pt woke up late today and was later taking BP Medications.  BP was 763R systolic.  PTA pt took regular Amlodipine, Lisinopril, Metoprolol.  Pt denies any sx. NAD.  EMS VSS except for BP 198/122

## 2020-03-09 NOTE — ED Notes (Signed)
Called [facility] for pt transport to home 

## 2020-03-09 NOTE — ED Notes (Signed)
Pt is next to pick up by [facility]

## 2020-03-17 ENCOUNTER — Other Ambulatory Visit: Payer: Medicare Other

## 2020-04-05 ENCOUNTER — Ambulatory Visit: Payer: Medicare Other | Admitting: Neurology

## 2020-04-15 ENCOUNTER — Other Ambulatory Visit: Payer: Medicare Other

## 2020-05-06 ENCOUNTER — Encounter: Payer: Self-pay | Admitting: Nurse Practitioner

## 2020-06-01 ENCOUNTER — Other Ambulatory Visit: Payer: Self-pay

## 2020-06-01 DIAGNOSIS — I779 Disorder of arteries and arterioles, unspecified: Secondary | ICD-10-CM

## 2020-06-07 ENCOUNTER — Encounter (HOSPITAL_COMMUNITY): Payer: Medicare Other

## 2020-06-07 ENCOUNTER — Ambulatory Visit: Payer: Medicare Other

## 2020-06-09 ENCOUNTER — Ambulatory Visit: Payer: Medicare Other | Admitting: Nurse Practitioner

## 2020-06-09 NOTE — Progress Notes (Deleted)
06/09/2020 Diamond Rice [id number] 01-03-1971   CHIEF COMPLAINT:   HISTORY OF PRESENT ILLNESS:  Diamond Rice is a 49 year old female with a past medical history of anxiety, depression, bipolar disorder, poly substance abuse including cocaine, Schizoaffective disorder, bipolar type  hypertension, history of hemorrhagic CVA in 10/07/2018 with left hemiparesis, acute small lacunar infarct in the left coronary radiata with no associated hemorrhage 11/2019 treated with dual antiplatelet therapy x 3 weeks (ASA and Plavix) then only ASA, hypercholesterolemia, COPD, DM II, sleep apnea and colon polyps.   CBC Latest Ref Rng & Units 02/17/2020 12/17/2019 12/15/2019  WBC 4.0 - 10.5 K/uL 9.2 5.9 5.4  Hemoglobin 12.0 - 15.0 g/dL 10.5(L) 10.8(A) 11.6(L)  Hematocrit 36 - 46 % 33.8(L) 34(A) 37.3  Platelets 150 - 400 K/uL 388 232 222   CMP Latest Ref Rng & Units 02/17/2020 12/17/2019 12/15/2019  Glucose 70 - 99 mg/dL 140(H) - 132(H)  BUN 6 - 20 mg/dL 20 14 15   Creatinine 0.44 - 1.00 mg/dL 1.21(H) 1.1 1.26(H)  Sodium 135 - 145 mmol/L 136 141 140  Potassium 3.5 - 5.1 mmol/L 3.6 4.2 3.5  Chloride 98 - 111 mmol/L 104 107 107  CO2 22 - 32 mmol/L 20(L) 22 22  Calcium 8.9 - 10.3 mg/dL 9.1 9.0 9.1  Total Protein 6.5 - 8.1 g/dL - - -  Total Bilirubin 0.3 - 1.2 mg/dL - - -  Alkaline Phos 38 - 126 U/L - - -  AST 15 - 41 U/L - - -  ALT 0 - 44 U/L - - -    Colonoscopy by Dr. Genia Del 04/04/2018 at [facility]:  - Colonic polyps status post polypectomy. - Ulcers in the distal rectum (likely stercoral). Biopsied. - The examination was otherwise normal on direct and retroflexion views. Limited due to quality of preparation. 1. Colon, polyp(s), Right Ascending - TUBULAR ADENOMA(S). - HIGH GRADE DYSPLASIA IS NOT IDENTIFIED. 2. Colon, polyp(s), Left Descending - TUBULAR ADENOMA(S). - HIGH GRADE DYSPLASIA IS NOT IDENTIFIED. 3. Rectum, biopsy - INFLAMED AND ULCERATED COLORECTAL TYPE MUCOSA. - THERE IS NO EVIDENCE  OF MALIGNANCY. - SEE COMMENT.  ECHO 12/07/2019: 1. Left ventricular ejection fraction, by estimation, is 65 to 70%. The left ventricle has normal function. The left ventricle has no regional wall motion abnormalities. There is moderate concentric left ventricular hypertrophy. Left ventricular diastolic parameters are consistent with Grade I diastolic dysfunction (impaired relaxation). Elevated left ventricular enddiastolic pressure. 2. Right ventricular systolic function is normal. The right ventricular size is normal. Tricuspid regurgitation signal is inadequate for assessing PA pressure. 3. The mitral valve is normal in structure. Trivial mitral valve regurgitation. No evidence of mitral stenosis. 4. The aortic valve is tricuspid. Aortic valve regurgitation is mild to mdoerate. Aortic valve mean gradient measures 9.0 mmHg. Aortic valve peak gradient measures 16.4 mmHg. Aortic valve area, by VTI measures 1.53 cm. Very mild aortic stenosis. 5. The inferior vena cava is normal in size with greater than 50% respiratory variability, suggesting right atrial pressure of 3 mmHg. 6. Left atrial size was moderately dilated.  Past Medical History:  Diagnosis Date  . Anxiety   . Chronic lower back pain 04/17/2012   "just got over some; catched when I walked"  . COPD (chronic obstructive pulmonary disease) ([place])   . Critical lower limb ischemia 05/27/2018  . Depression   . Headache(784.0) 04/17/2012   "~ qod; lately waking up in am w/one"  . High cholesterol   . Hypertension   .  Migraines 04/17/2012  . Obesity   . Sleep apnea    CPAP  . Stroke ([place])   . Type II diabetes mellitus ([place]) 08/28/2002   Past Surgical History:  Procedure Laterality Date  . ABDOMINAL AORTOGRAM W/LOWER EXTREMITY Left 05/27/2018   Procedure: ABDOMINAL AORTOGRAM W/LOWER EXTREMITY Runoff and Possible Intervention;  Surgeon: Waynetta Sandy, MD;  Location: [facility];  Service: Cardiovascular;   Laterality: Left;  . APPLICATION OF WOUND VAC Left 06/06/2018   Procedure: APPLICATION OF WOUND VAC;  Surgeon: Waynetta Sandy, MD;  Location: [facility];  Service: Vascular;  Laterality: Left;  . BIOPSY  04/04/2018   Procedure: BIOPSY;  Surgeon: [name], MD;  Location: [facility] ENDOSCOPY;  Service: Endoscopy;;  . CARDIAC CATHETERIZATION  ~ 2011  . COLONOSCOPY N/A 04/04/2018   Procedure: COLONOSCOPY;  Surgeon: [name], MD;  Location: [facility] ENDOSCOPY;  Service: Endoscopy;  Laterality: N/A;  . LOWER EXTREMITY ANGIOGRAM Left 05/27/2018  . PERIPHERAL VASCULAR INTERVENTION Left 05/27/2018   Procedure: PERIPHERAL VASCULAR INTERVENTION;  Surgeon: Waynetta Sandy, MD;  Location: [facility];  Service: Cardiovascular;  Laterality: Left;  . POLYPECTOMY  04/04/2018   Procedure: POLYPECTOMY;  Surgeon: [name], MD;  Location: [facility] ENDOSCOPY;  Service: Endoscopy;;  . TRANSMETATARSAL AMPUTATION Left 05/28/2018   Procedure: AMPUTATION TOES THREE, FOUR AND FIVE on left foot;  Surgeon: Waynetta Sandy, MD;  Location: [facility];  Service: Vascular;  Laterality: Left;  . WOUND DEBRIDEMENT Left 06/06/2018   Procedure: DEBRIDEMENT WOUND LEFT FOOT;  Surgeon: Waynetta Sandy, MD;  Location: [facility];  Service: Vascular;  Laterality: Left;   Social History:  Family History:    reports that she has quit smoking. Her smoking use included cigarettes. She has a 7.00 pack-year smoking history. She has never used smokeless tobacco. She reports current drug use. Drugs: Marijuana and "Crack" cocaine. She reports that she does not drink alcohol. family history includes Heart attack (age of onset: 46) in her mother; Stroke (age of onset: 56) in her brother and brother; Stroke (age of onset: 56) in her father. Allergies  Allergen Reactions  . Morphine And Related Hives  . Metformin Diarrhea and Other (See Comments)    "Allergic," per [facility]      Outpatient Encounter Medications as of  06/09/2020  Medication Sig  . amLODipine (NORVASC) 10 MG tablet Take 1 tablet (10 mg total) by mouth daily.  . ARIPiprazole (ABILIFY) 15 MG tablet Take 1 tablet (15 mg total) by mouth at bedtime.  Marland Kitchen aspirin EC 81 MG EC tablet Take 1 tablet (81 mg total) by mouth daily.  Marland Kitchen atorvastatin (LIPITOR) 40 MG tablet Take 1 tablet (40 mg total) by mouth daily at 6 PM.  . bisacodyl (DULCOLAX) 10 MG suppository Place 10 mg rectally daily as needed for mild constipation or moderate constipation (if no relief from Milk of Magnesia).  . cilostazol (PLETAL) 100 MG tablet Take 1 tablet (100 mg total) by mouth 2 (two) times daily.  . cloNIDine (CATAPRES) 0.3 MG tablet Take 1 tablet (0.3 mg total) by mouth 2 (two) times daily.  . ferrous sulfate 325 (65 FE) MG tablet Take 1 tablet (325 mg total) by mouth daily with breakfast.  . hydrALAZINE (APRESOLINE) 100 MG tablet Take 1 tablet (100 mg total) by mouth 3 (three) times daily.  Marland Kitchen HYDROcodone-acetaminophen (NORCO/VICODIN) 5-325 MG tablet Take 1 tablet by mouth every 6 (six) hours as needed for severe pain.  Marland Kitchen insulin detemir (LEVEMIR) 100  UNIT/ML FlexPen Inject 15 Units into the skin 2 (two) times daily.  Marland Kitchen lactulose (CHRONULAC) 10 GM/15ML solution Take 30 g by mouth 2 (two) times daily. For Constipation  . lidocaine (LIDODERM) 5 % Place 1 patch onto the skin daily. Remove & Discard patch within 12 hours or as directed by MD  . lisinopril (ZESTRIL) 40 MG tablet Take 1 tablet (40 mg total) by mouth daily.  . magnesium hydroxide (MILK OF MAGNESIA) 400 MG/5ML suspension Take 30 mLs by mouth daily as needed for mild constipation.  . methylPREDNISolone (MEDROL DOSEPAK) 4 MG TBPK tablet Per dose pack instructions  . metoprolol tartrate (LOPRESSOR) 25 MG tablet Take 0.5 tablets (12.5 mg total) by mouth 2 (two) times daily.  . mirtazapine (REMERON) 15 MG tablet Take 1 tablet (15 mg total) by mouth at bedtime. For Depression  . Nutritional Supplement LIQD Take 120 mLs by  mouth daily. MedPass  . Nutritional Supplements (NUTRITIONAL SUPPLEMENT PO) Take 1 each by mouth daily. Magic Cup  . senna-docusate (SENEXON-S) 8.6-50 MG tablet Take 2 tablets by mouth 2 (two) times daily. For Constipation   No facility-administered encounter medications on file as of 06/09/2020.     REVIEW OF SYSTEMS: All other systems reviewed and negative except where noted in the History of Present Illness.  Gen: Denies fever, sweats or chills. No weight loss.  CV: Denies chest pain, palpitations or edema. Resp: Denies cough, shortness of breath of hemoptysis.  GI: Denies heartburn, dysphagia, stomach or lower abdominal pain. No diarrhea or constipation.  GU : Denies urinary burning, blood in urine, increased urinary frequency or incontinence. MS: Denies joint pain, muscles aches or weakness. Derm: Denies rash, itchiness, skin lesions or unhealing ulcers. Psych: Denies depression, anxiety, memory loss, suicidal ideation and confusion. Heme: Denies bruising, bleeding. Neuro:  Denies headaches, dizziness or paresthesias. Endo:  Denies any problems with DM, thyroid or adrenal function.    PHYSICAL EXAM: There were no vitals taken for this visit. General: Well developed ... in no acute distress. Head: Normocephalic and atraumatic. Eyes:  Sclerae non-icteric, conjunctive pink. Ears: Normal auditory acuity. Mouth: Dentition intact. No ulcers or lesions.  Neck: Supple, no lymphadenopathy or thyromegaly.  Lungs: Clear bilaterally to auscultation without wheezes, crackles or rhonchi. Heart: Regular rate and rhythm. No murmur, rub or gallop appreciated.  Abdomen: Soft, nontender, non distended. No masses. No hepatosplenomegaly. Normoactive bowel sounds x 4 quadrants.  Rectal:  Musculoskeletal: Symmetrical with no gross deformities. Skin: Warm and dry. No rash or lesions on visible extremities. Extremities: No edema. Neurological: Alert oriented x 4, no focal deficits.    Psychological:  Alert and cooperative. Normal mood and affect.  ASSESSMENT AND PLAN:    CC:  [name], MD

## 2020-06-14 ENCOUNTER — Ambulatory Visit: Payer: Medicare Other | Admitting: Neurology

## 2020-06-14 ENCOUNTER — Encounter: Payer: Self-pay | Admitting: Internal Medicine

## 2020-06-25 ENCOUNTER — Ambulatory Visit: Payer: Medicare Other

## 2020-06-25 ENCOUNTER — Encounter (HOSPITAL_COMMUNITY): Payer: Medicare Other

## 2020-07-27 ENCOUNTER — Ambulatory Visit (HOSPITAL_COMMUNITY)
Admission: RE | Admit: 2020-07-27 | Discharge: 2020-07-27 | Disposition: A | Discharge status: Home / Self Care | Payer: Medicare Other | Source: Ambulatory Visit | Attending: Vascular Surgery | Admitting: Vascular Surgery

## 2020-07-27 ENCOUNTER — Ambulatory Visit (INDEPENDENT_AMBULATORY_CARE_PROVIDER_SITE_OTHER)
Admission: RE | Admit: 2020-07-27 | Discharge: 2020-07-27 | Disposition: A | Discharge status: Home / Self Care | Payer: Medicare Other | Source: Ambulatory Visit | Attending: Vascular Surgery | Admitting: Vascular Surgery

## 2020-07-27 ENCOUNTER — Other Ambulatory Visit: Payer: Self-pay

## 2020-07-27 ENCOUNTER — Ambulatory Visit (INDEPENDENT_AMBULATORY_CARE_PROVIDER_SITE_OTHER): Payer: Medicare Other | Admitting: Physician Assistant

## 2020-07-27 VITALS — BP 205/121 | HR 71 | Temp 98.4°F | Resp 20 | Ht 61.0 in | Wt 182.8 lb

## 2020-07-27 DIAGNOSIS — I779 Disorder of arteries and arterioles, unspecified: Secondary | ICD-10-CM

## 2020-07-27 DIAGNOSIS — S98132A Complete traumatic amputation of one left lesser toe, initial encounter: Secondary | ICD-10-CM

## 2020-07-27 NOTE — Progress Notes (Signed)
History of Present Illness:  Patient is a 49 y.o. year old female who presents for evaluation of PAD B LE.  She has previously undergone angiogram with balloon angioplasty of left anterior tibial artery with 2.5 mm balloon, followed by amputation of 3-5 toes on the left foot with secondary closure that has now healed.  She returns today for ABI's and arterial duplex.  She denise new non healing wounds, rest pain or claudication.  She only ambulates in her home.  She is currently on Lipitor daily.  I asked her if she is taking ASA daily and she states she stopped.   Past Medical History:  Diagnosis Date  . Anxiety   . Chronic lower back pain 04/17/2012   "just got over some; catched when I walked"  . COPD (chronic obstructive pulmonary disease) ([place])   . Critical lower limb ischemia ([place]) 05/27/2018  . Depression   . Headache(784.0) 04/17/2012   "~ qod; lately waking up in am w/one"  . High cholesterol   . Hypertension   . Migraines 04/17/2012  . Obesity   . Sleep apnea    CPAP  . Stroke ([place])   . Type II diabetes mellitus ([place]) 08/28/2002    Past Surgical History:  Procedure Laterality Date  . ABDOMINAL AORTOGRAM W/LOWER EXTREMITY Left 05/27/2018   Procedure: ABDOMINAL AORTOGRAM W/LOWER EXTREMITY Runoff and Possible Intervention;  Surgeon: Waynetta Sandy, MD;  Location: [place] [facility];  Service: Cardiovascular;  Laterality: Left;  . APPLICATION OF WOUND VAC Left 06/06/2018   Procedure: APPLICATION OF WOUND VAC;  Surgeon: Waynetta Sandy, MD;  Location: [place];  Service: Vascular;  Laterality: Left;  . BIOPSY  04/04/2018   Procedure: BIOPSY;  Surgeon: [name], MD;  Location: [facility] ENDOSCOPY;  Service: Endoscopy;;  . CARDIAC CATHETERIZATION  ~ 2011  . COLONOSCOPY N/A 04/04/2018   Procedure: COLONOSCOPY;  Surgeon: [name], MD;  Location: [facility] ENDOSCOPY;  Service: Endoscopy;  Laterality: N/A;  . LOWER EXTREMITY ANGIOGRAM Left 05/27/2018  . PERIPHERAL  VASCULAR INTERVENTION Left 05/27/2018   Procedure: PERIPHERAL VASCULAR INTERVENTION;  Surgeon: Waynetta Sandy, MD;  Location: [place] [facility];  Service: Cardiovascular;  Laterality: Left;  . POLYPECTOMY  04/04/2018   Procedure: POLYPECTOMY;  Surgeon: [name], MD;  Location: [facility] ENDOSCOPY;  Service: Endoscopy;;  . TRANSMETATARSAL AMPUTATION Left 05/28/2018   Procedure: AMPUTATION TOES THREE, FOUR AND FIVE on left foot;  Surgeon: Waynetta Sandy, MD;  Location: [place];  Service: Vascular;  Laterality: Left;  . WOUND DEBRIDEMENT Left 06/06/2018   Procedure: DEBRIDEMENT WOUND LEFT FOOT;  Surgeon: Waynetta Sandy, MD;  Location: [facility] OR;  Service: Vascular;  Laterality: Left;    ROS:   General:  No weight loss, Fever, chills  HEENT: No recent headaches, no nasal bleeding, no visual changes, no sore throat  Neurologic: No dizziness, blackouts, seizures. No recent symptoms of stroke or mini- stroke. No recent episodes of slurred speech, or temporary blindness.  Cardiac: No recent episodes of chest pain/pressure, no shortness of breath at rest.  No shortness of breath with exertion.  Denies history of atrial fibrillation or irregular heartbeat  Vascular: No history of rest pain in feet.  No history of claudication.  No history of non-healing ulcer, No history of DVT   Pulmonary: No home oxygen, no productive cough, no hemoptysis,  No asthma or wheezing  Musculoskeletal:  $RemoveBeforeDEI'[ ]'QoWSAMSMeeAzXCQe$  Arthritis, $RemoveBef'[ ]'nOcEcvKxwz$  Low back pain,  $Remo'[ ]'fwcjV$  Joint pain  Hematologic:No history of hypercoagulable state.  No history of easy bleeding.  No history of anemia  Gastrointestinal: No hematochezia or melena,  No gastroesophageal reflux, no trouble swallowing  Urinary: [ ]  chronic Kidney disease, [ ]  on HD - [ ]  MWF or [ ]  TTHS, [ ]  Burning with urination, [ ]  Frequent urination, [ ]  Difficulty urinating;   Skin: No rashes  Psychological: No history of anxiety,  No history of depression  Social  History Social History   Tobacco Use  . Smoking status: Former Smoker    Packs/day: 0.25    Years: 28.00    Pack years: 7.00    Types: Cigarettes  . Smokeless tobacco: Never Used  Vaping Use  . Vaping Use: Never used  Substance Use Topics  . Alcohol use: No    Alcohol/week: 0.0 standard drinks    Comment: rare  . Drug use: Yes    Types: Marijuana, "Crack" cocaine    Comment: marijuana last use days ago; last cocaine several months ago    Family History Family History  Problem Relation Age of Onset  . Stroke Brother 48  . Stroke Brother 38  . Heart attack Mother 67  . Stroke Father 8    Allergies  Allergies  Allergen Reactions  . Morphine And Related Hives  . Metformin Diarrhea and Other (See Comments)    "Allergic," per [facility]     Current Outpatient Medications  Medication Sig Dispense Refill  . ACCU-CHEK GUIDE test strip 2 (two) times daily. as directed    . Accu-Chek Softclix Lancets lancets 2 (two) times daily.    Marland Kitchen amLODipine (NORVASC) 10 MG tablet Take 1 tablet (10 mg total) by mouth daily. 30 tablet 0  . ARIPiprazole (ABILIFY) 15 MG tablet Take 1 tablet (15 mg total) by mouth at bedtime. 30 tablet 0  . aspirin EC 81 MG EC tablet Take 1 tablet (81 mg total) by mouth daily.    Marland Kitchen atorvastatin (LIPITOR) 40 MG tablet Take 1 tablet (40 mg total) by mouth daily at 6 PM.    . bisacodyl (DULCOLAX) 10 MG suppository Place 10 mg rectally daily as needed for mild constipation or moderate constipation (if no relief from Milk of Magnesia).    . Blood Glucose Monitoring Suppl (ACCU-CHEK GUIDE) w/Device KIT USE AS DIRECTED TO CHECK BLOOD SUGAR    . cilostazol (PLETAL) 100 MG tablet Take 1 tablet (100 mg total) by mouth 2 (two) times daily. 60 tablet 0  . cloNIDine (CATAPRES) 0.3 MG tablet Take 1 tablet (0.3 mg total) by mouth 2 (two) times daily. 60 tablet 0  . escitalopram (LEXAPRO) 10 MG tablet     . ferrous sulfate 325 (65 FE) MG tablet Take 1 tablet (325 mg total) by  mouth daily with breakfast. 30 tablet 0  . hydrALAZINE (APRESOLINE) 100 MG tablet Take 1 tablet (100 mg total) by mouth 3 (three) times daily. 90 tablet 0  . HYDROcodone-acetaminophen (NORCO/VICODIN) 5-325 MG tablet Take 1 tablet by mouth every 6 (six) hours as needed for severe pain. 4 tablet 0  . insulin detemir (LEVEMIR) 100 UNIT/ML FlexPen Inject 15 Units into the skin 2 (two) times daily. 15 mL 0  . iron polysaccharides (NU-IRON) 150 MG capsule Nu-Iron 150 mg iron capsule  Take 1 capsule twice a day by oral route with meals.    . lactulose (CHRONULAC) 10 GM/15ML solution Take 30 g by mouth 2 (two) times daily. For Constipation    . lidocaine (LIDODERM)  5 % Place 1 patch onto the skin daily. Remove & Discard patch within 12 hours or as directed by MD 30 patch 0  . lisinopril (ZESTRIL) 40 MG tablet Take 1 tablet (40 mg total) by mouth daily. 30 tablet 0  . magnesium hydroxide (MILK OF MAGNESIA) 400 MG/5ML suspension Take 30 mLs by mouth daily as needed for mild constipation.    . methylPREDNISolone (MEDROL DOSEPAK) 4 MG TBPK tablet Per dose pack instructions 21 tablet 0  . metoprolol tartrate (LOPRESSOR) 25 MG tablet Take 0.5 tablets (12.5 mg total) by mouth 2 (two) times daily. 60 tablet 0  . mirtazapine (REMERON) 15 MG tablet Take 1 tablet (15 mg total) by mouth at bedtime. For Depression 30 tablet 0  . Nutritional Supplement LIQD Take 120 mLs by mouth daily. MedPass    . Nutritional Supplements (NUTRITIONAL SUPPLEMENT PO) Take 1 each by mouth daily. Magic Cup    . rosuvastatin (CRESTOR) 40 MG tablet rosuvastatin 40 mg tablet    . senna-docusate (SENEXON-S) 8.6-50 MG tablet Take 2 tablets by mouth 2 (two) times daily. For Constipation     No current facility-administered medications for this visit.    Physical Examination  Vitals:   07/27/20 1435  BP: (!) 205/121  Pulse: 71  Resp: 20  Temp: 98.4 F (36.9 C)  TempSrc: Temporal  SpO2: 100%  Weight: 182 lb 12.8 oz (82.9 kg)    Height: $Remove'5\' 1"'GSvUdzD$  (1.549 m)    Body mass index is 34.54 kg/m.  General:  Alert and oriented, no acute distress HEENT: Normal Neck: No bruit or JVD Pulmonary: Clear to auscultation bilaterally Cardiac: Regular Rate and Rhythm without murmur Abdomen: Soft, non-tender, non-distended, no mass, no scars Skin: No rash, well healed left foot s/p toe amputations. Extremity Pulses:  2+ radial, brachial, femoral, non palpable dorsalis pedis, posterior tibial pulses bilaterally Musculoskeletal: No deformity or edema  Neurologic: Upper and lower extremity motor 5/5 and symmetric  DATA:     +----------+--------+-----+--------+---------+--------+  RIGHT   PSV cm/sRatioStenosisWaveform Comments  +----------+--------+-----+--------+---------+--------+  CFA Distal85          triphasic      +----------+--------+-----+--------+---------+--------+  DFA    51          triphasic      +----------+--------+-----+--------+---------+--------+  SFA Prox 79          triphasic      +----------+--------+-----+--------+---------+--------+  SFA Mid  68          triphasic      +----------+--------+-----+--------+---------+--------+  SFA Distal66          triphasic      +----------+--------+-----+--------+---------+--------+  POP Prox 53          triphasic      +----------+--------+-----+--------+---------+--------+  POP Distal48          triphasic      +----------+--------+-----+--------+---------+--------+  PTA Distal232          triphasic      +----------+--------+-----+--------+---------+--------+   A focal velocity elevation of 232 cm/s was obtained at Distal posterior  tibial artery with a VR of 3.6. Findings are characteristic of 50-74%  stenosis.      +----------+--------+-----+--------+---------+--------+  LEFT   PSV  cm/sRatioStenosisWaveform Comments  +----------+--------+-----+--------+---------+--------+  CFA Distal84          triphasic      +----------+--------+-----+--------+---------+--------+  DFA    69          biphasic       +----------+--------+-----+--------+---------+--------+  SFA Prox 75  triphasic      +----------+--------+-----+--------+---------+--------+  SFA Mid  69          triphasic      +----------+--------+-----+--------+---------+--------+  SFA Distal59          triphasic      +----------+--------+-----+--------+---------+--------+  POP Prox 57          triphasic      +----------+--------+-----+--------+---------+--------+  POP Distal76          triphasic      +----------+--------+-----+--------+---------+--------+  ATA Prox 113          triphasic      +----------+--------+-----+--------+---------+--------+  ATA Mid                  tortuous  +----------+--------+-----+--------+---------+--------+  ATA Distal102          triphasic      +----------+--------+-----+--------+---------+--------+  PTA Distal34                     +----------+--------+-----+--------+---------+--------+    Summary:  Right: 50-74% stenosis noted in the posterior tibial artery.      ABI Findings:  +---------+------------------+-----+----------+--------+  Right  Rt Pressure (mmHg)IndexWaveform Comment   +---------+------------------+-----+----------+--------+  Brachial 214                      +---------+------------------+-----+----------+--------+  ATA   180        0.84            +---------+------------------+-----+----------+--------+  PTA   198        0.93 monophasic        +---------+------------------+-----+----------+--------+  DP                triphasic       +---------+------------------+-----+----------+--------+  Great Toe140        0.65            +---------+------------------+-----+----------+--------+   +---------+------------------+-----+-------------------+-------+  Left   Lt Pressure (mmHg)IndexWaveform      Comment  +---------+------------------+-----+-------------------+-------+  Brachial 202                           +---------+------------------+-----+-------------------+-------+  ATA   135        0.63                 +---------+------------------+-----+-------------------+-------+  PTA   126        0.59 monophasic           +---------+------------------+-----+-------------------+-------+  DP                dampened monophasic      +---------+------------------+-----+-------------------+-------+  Great Toe80        0.37                 +---------+------------------+-----+-------------------+-------+   +-------+-----------+-----------+------------+------------+  ABI/TBIToday's ABIToday's TBIPrevious ABIPrevious TBI  +-------+-----------+-----------+------------+------------+  Right 0.93    0.65    1.04            +-------+-----------+-----------+------------+------------+  Left  0.63    0.37    0.81            +-------+-----------+-----------+------------+------------+     Bilateral ABIs appear decreased compared to prior study on 05/30/2018.    Summary:  Right: Resting right ankle-brachial index indicates mild right lower  extremity arterial disease. The right toe-brachial index is abnormal. RT  great toe pressure = 140 mmHg.   Left: Resting left ankle-brachial index indicates moderate left  lower  extremity arterial  disease. The left toe-brachial index is abnormal. LT  Great toe pressure = 80 mmHg.    ASSESSMENT:  PAD BLE currently asymptomatic Triphasic flow B LE.  Right: 50-74% stenosis noted in the posterior tibial artery.  Stable PAD disposition currently.  PLAN:  She will ambulate in her home as much as she can tolerate.  Check her feet daily for wounds.  F/U in 6 months for repeat ABI and duplex.  Is she develops symptoms of rest pain or non healing wounds she will call sooner.   Roxy Horseman PA-C [facility] Office: [phone]  MD in clinic [place]

## 2020-08-12 ENCOUNTER — Ambulatory Visit: Payer: Medicare Other | Attending: Internal Medicine | Admitting: Physical Therapy

## 2020-08-12 ENCOUNTER — Other Ambulatory Visit: Payer: Self-pay

## 2020-08-12 ENCOUNTER — Encounter: Payer: Self-pay | Admitting: Physical Therapy

## 2020-08-12 VITALS — BP 123/83 | HR 68

## 2020-08-12 DIAGNOSIS — M6281 Muscle weakness (generalized): Secondary | ICD-10-CM

## 2020-08-12 DIAGNOSIS — R2681 Unsteadiness on feet: Secondary | ICD-10-CM

## 2020-08-12 DIAGNOSIS — R29818 Other symptoms and signs involving the nervous system: Secondary | ICD-10-CM | POA: Diagnosis present

## 2020-08-12 DIAGNOSIS — R262 Difficulty in walking, not elsewhere classified: Secondary | ICD-10-CM

## 2020-08-12 DIAGNOSIS — R269 Unspecified abnormalities of gait and mobility: Secondary | ICD-10-CM | POA: Diagnosis present

## 2020-08-12 NOTE — Therapy (Signed)
[facility] [address] [place], [place], [address] Phone: [phone]   Fax:  [phone]  Physical Therapy Evaluation  Patient Details  Name: Diamond Rice MRN: [id number] Date of Birth: 05-29-1971 Referring Provider (PT): Johney Maine, MD   Encounter Date: 08/12/2020   PT End of Session - 08/12/20 1155    Visit Number 1    Number of Visits 17    Date for PT Re-Evaluation 11/10/20   written for 60 day POC   Authorization Type Medicare & Medicaid    PT Start Time 1100    PT Stop Time 1141    PT Time Calculation (min) 41 min    Equipment Utilized During Treatment Gait belt    Activity Tolerance Patient tolerated treatment well;Patient limited by fatigue    Behavior During Therapy [facility] for tasks assessed/performed;Flat affect           Past Medical History:  Diagnosis Date   Anxiety    Chronic lower back pain 04/17/2012   "just got over some; catched when I walked"   COPD (chronic obstructive pulmonary disease) ([place])    Critical lower limb ischemia ([place]) 05/27/2018   Depression    Headache(784.0) 04/17/2012   "~ qod; lately waking up in am w/one"   High cholesterol    Hypertension    Migraines 04/17/2012   Obesity    Sleep apnea    CPAP   Stroke ([place])    Type II diabetes mellitus ([place]) 08/28/2002    Past Surgical History:  Procedure Laterality Date   ABDOMINAL AORTOGRAM W/LOWER EXTREMITY Left 05/27/2018   Procedure: ABDOMINAL AORTOGRAM W/LOWER EXTREMITY Run[place] and Possible Intervention;  Surgeon: Waynetta Sandy, MD;  Location: [place] [facility];  Service: Cardiovascular;  Laterality: Left;   APPLICATION OF WOUND VAC Left 06/06/2018   Procedure: APPLICATION OF WOUND VAC;  Surgeon: Waynetta Sandy, MD;  Location: [place];  Service: Vascular;  Laterality: Left;   BIOPSY  04/04/2018   Procedure: BIOPSY;  Surgeon: [name], MD;  Location: [facility] ENDOSCOPY;  Service:  Endoscopy;;   CARDIAC CATHETERIZATION  ~ 2011   COLONOSCOPY N/A 04/04/2018   Procedure: COLONOSCOPY;  Surgeon: [name], MD;  Location: [facility] ENDOSCOPY;  Service: Endoscopy;  Laterality: N/A;   LOWER EXTREMITY ANGIOGRAM Left 05/27/2018   PERIPHERAL VASCULAR INTERVENTION Left 05/27/2018   Procedure: PERIPHERAL VASCULAR INTERVENTION;  Surgeon: Waynetta Sandy, MD;  Location: [place] [facility];  Service: Cardiovascular;  Laterality: Left;   POLYPECTOMY  04/04/2018   Procedure: POLYPECTOMY;  Surgeon: [name], MD;  Location: [facility] ENDOSCOPY;  Service: Endoscopy;;   TRANSMETATARSAL AMPUTATION Left 05/28/2018   Procedure: AMPUTATION TOES THREE, FOUR AND FIVE on left foot;  Surgeon: Waynetta Sandy, MD;  Location: [place];  Service: Vascular;  Laterality: Left;   WOUND DEBRIDEMENT Left 06/06/2018   Procedure: DEBRIDEMENT WOUND LEFT FOOT;  Surgeon: Waynetta Sandy, MD;  Location: [place];  Service: Vascular;  Laterality: Left;    Vitals:   08/12/20 1105  BP: 123/83  Pulse: 68      Subjective Assessment - 08/12/20 1107    Subjective Reports her legs hurt when she walks. Had another stroke April 2021 with dysphagia and dysarthria and reports ST came out to the house. Now walking with a large base quad cane, reporting feeling that she is unbalanced sometimes. No falls. Has been taking her BP medication. Has been working on walking around the inside of her house.  Pertinent History CVA 1/20 - Right posterior basal ganglia/posterior limb internal capsule intracerebral hemorrhage, CVA 4/21 (with dysphagia/dysarthria), rt CVA (2016, residual left sided weakness), lt transmet amputation 05/28/18, Sleep apnea, COPD, smoker, DM, Bipolar, HTN, anxiety, depression, PAD BLE    Limitations Standing;Walking    Patient Stated Goals wants to get a little stronger.    Currently in Pain? No/denies              [facility] PT Assessment - 08/12/20 1110      Assessment   Medical  Diagnosis late effects of CVA   (2016, Jan 2020, April 2021)   Referring Provider (PT) Johney Maine, MD    Onset Date/Surgical Date 08/04/20   date of referral   Hand Dominance Right    Prior Therapy previous PT at this location at end of 2020      Precautions   Precautions Fall    Precaution Comments hx of HTN and unstable BP, 2 toes on L foot due to transmet amputation 05/28/18      Balance Screen   Has the patient fallen in the past 6 months No    Has the patient had a decrease in activity level because of a fear of falling?  Yes    Is the patient reluctant to leave their home because of a fear of falling?  Yes      [place] residence    Living Arrangements Other (Comment)   Godmother and her 2 kids   Type of Home Apartment    Home Access Level entry    Home Layout One level    [place] - quad;Other (comment);Walker - standard;Shower seat;Grab bars - toilet;Grab bars - tub/shower   large base quad cane, rollator   Additional Comments reports that godmother does not help her at home. 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    Leisure playing games on phone and watching TV      Sensation   Light Touch Impaired Detail    Light Touch Impaired Details Impaired LLE;Impaired LUE    Additional Comments can detect light touch B - harder to detect on L, reports whole L side feels numb      Coordination   Gross Motor Movements are Fluid and Coordinated No    Coordination and Movement Description unable to perform B, weakness on RLE, L hemiparesis      Tone   Assessment Location Left Upper Extremity;Left Lower Extremity      ROM / Strength   AROM / PROM / Strength Strength;PROM      PROM    Overall PROM Comments can reach L neutral DF with PROM      Strength   Overall Strength Deficits    Strength Assessment Site Hip    Right/Left Hip Right;Left    Right Hip Flexion 3-/5    Left Hip Flexion 2+/5    Right/Left Knee Right;Left    Right Knee Flexion 4-/5    Right Knee Extension 4/5    Left Knee Flexion 3-/5    Left Knee Extension 3+/5    Right/Left Ankle Right;Left    Right Ankle Dorsiflexion 4+/5    Left Ankle Dorsiflexion  4/5    Left Ankle Plantar Flexion 2+/5      Transfers   Transfers Sit to Stand;Stand to Sit    Sit to Stand 5: Supervision;With upper extremity assist;From chair/3-in-1   with RUE support   Sit to Stand Details (indicate cue type and reason) 28.38 seconds with RLE staggered posteriorly    Stand to Sit 5: Supervision;With upper extremity assist;To chair/3-in-1      Ambulation/Gait   Ambulation/Gait Yes    Ambulation/Gait Assistance 5: Supervision;4: Min guard    Ambulation/Gait Assistance Details into and out of clinic with large base quad cane 2 X 40'    Assistive device Large base quad cane    Gait Pattern Decreased stance time - left;Step-to pattern;Decreased arm swing - left;Decreased step length - right;Decreased hip/knee flexion - left;Decreased dorsiflexion - left;Decreased weight shift to left;Poor foot clearance - left   L toe drag   Ambulation Surface Level;Indoor    Gait velocity 19.96 seconds (in 10 ft) = .50 ft/sec   perfomed in 10 ft distance, pt  fatigued to walk in 20 or 32.8 ft distance today in eval - will re-assess at next visit     Standardized Balance Assessment   Standardized Balance Assessment Timed Up and Go Test      Timed Up and Go Test   Normal TUG (seconds) 37.16    TUG Comments with large base quad cane      LUE Tone   LUE Tone Hypertonic      LLE Tone   LLE Tone Hypertonic                      Objective measurements completed on examination: See above findings.               PT  Education - 08/12/20 1154    Education Details clinical findings, POC    Person(s) Educated Patient    Methods Explanation    Comprehension Verbalized understanding            PT Short Term Goals - 08/12/20 1157      PT SHORT TERM GOAL #1   Title Patient will be independent with initial HEP in order to build upon functional gains in therapy. ALL STGS DUE 09/09/20    Time 4    Period Weeks    Status New    Target Date 09/09/20      PT SHORT TERM GOAL #2   Title Pt will undergo further assessment of gait speed with appropriate distance and STG/LTG to be written as appropriate.    Baseline .5 ft/sec in 10 ft on 08/12/20 due to fatigue    Time 4    Period Weeks    Status New      PT SHORT TERM GOAL #3   Title Pt will undergo further assessment of L foot up brace vs. L AFO in order to demo improved functional mobility.    Time 4    Period Weeks    Status New      PT SHORT TERM GOAL #4   Title Patient will ambulate at least 43' with large base quad base cane and supervision in order to improve functional ambulation at home.    Time 4    Period Weeks    Status New      PT SHORT TERM GOAL #5   Title Pt will decr TUG time with large base quad cane to 33 seconds or less in  order to demo decr fall risk.    Baseline 37.16 with large base quad cane    Time 4    Period Weeks    Status New             PT Long Term Goals - 08/12/20 1159      PT LONG TERM GOAL #1   Title Patient will be independent with final HEP in order to build upon functional gains in therapy. ALL LTGS DUE 10/07/20    Time 8    Period Weeks    Status New    Target Date 10/07/20      PT LONG TERM GOAL #2   Title Gait speed goal to be written as appropriate with large base quad cane.    Time 8    Period Weeks    Status New      PT LONG TERM GOAL #3   Title Pt will decr TUG time with large base quad cane to 27 seconds or less in order to demo decr fall risk.    Baseline 37.16 with large base quad  cane    Time 8    Period Weeks    Status New      PT LONG TERM GOAL #4   Title Patient will ambulate at least 115' indoors and outdoors with large base quad base cane and supervision  in order to improve tolerance for walking in and out of doctors appointments    Time 8    Period Weeks    Status New      PT LONG TERM GOAL #5   Title Patient will decrease 5x sit <> stand time to at least 22 seconds with single UE assist from chair in order to demo improved LE strength.    Baseline 28.38 seconds from chair with RUE support from chair    Time [place] - 08/12/20 1209    Clinical Impression Statement Patient is a 49 year old female referred to Neuro OPPT for unsteadiness and hx of multiple CVAs. most recent CVA of April 2021 with dysphagia/dysarthria, CVA in 08/2018, history of R CVA in 2016 with residual L sided weakness. Pt's PMH is significant for: lt transmet amputation 05/28/18, Sleep apnea, COPD, smoker, DM, Bipolar, HTN, anxiety, depression, BLE PAD.  The following deficits were present during the exam: gait abnormalities, decr endurance, decreased LE strength, decreased AROM, decreased coordination, incr tone LUE/LLE, impaired sensation.  Pts 5x sit <> stand and TUG score indicate that pt is at an incr risk for falls. Due to fatigue only able to assess gait speed in 10 ft - at .5 ft/sec with large base quad cane indicating a household ambulator. Will further assess at next session. Pt would benefit from skilled PT to address these impairments and functional limitations to maximize functional mobility independence.    Personal Factors and Comorbidities Comorbidity 3+;Past/Current Experience;Time since onset of injury/illness/exacerbation;Fitness    Comorbidities CVA 4/21 and CVA 1/20, rt CVA (2016, residual left sided weakness), lt transmet amputation 05/28/18, Sleep apnea, COPD, smoker, DM, Bipolar, HTN, anxiety, depression, BLE PAD     Examination-Activity Limitations Stand;Locomotion Level;Transfers    Examination-Participation Restrictions Community Activity;Meal Prep;Cleaning    Stability/Clinical Decision Making Evolving/Moderate complexity    Clinical Decision Making Moderate    Rehab Potential Fair    PT Frequency 2x /  week    PT Duration 8 weeks    PT Treatment/Interventions ADLs/Self Care Home Management;Gait training;DME Instruction;Stair training;Therapeutic activities;Functional mobility training;Therapeutic exercise;Balance training;Neuromuscular re-education;Patient/family education;Passive range of motion;Orthotic Fit/Training;Vestibular    PT Next Visit Plan monitor BP. HEP for strengthening/balance. assess gait speed. trial L foot up brace (pt might not have family support to help with AFO at home)    Recommended Other Services speech therapy eval for dysphagia/dysarthria    Consulted and Agree with Plan of Care Patient           Patient will benefit from skilled therapeutic intervention in order to improve the following deficits and impairments:  Abnormal gait,Decreased balance,Decreased activity tolerance,Decreased endurance,Decreased mobility,Decreased range of motion,Decreased strength,Difficulty walking,Decreased coordination,Impaired UE functional use,Impaired tone,Impaired sensation  Visit Diagnosis: Difficulty in walking, not elsewhere classified  Muscle weakness (generalized)  Gait abnormality  Other symptoms and signs involving the nervous system  Unsteadiness on feet     Problem List Patient Active Problem List   Diagnosis Date Noted   Acute CVA (cerebrovascular accident) ([place]) 12/06/2019   AKI (acute kidney injury) ([place]) 12/06/2019   LVH (left ventricular hypertrophy) 08/10/2019   Edema of lower extremity 11/26/2018   Loss of appetite 11/26/2018   Urinary incontinence 11/26/2018   Allergic rhinitis 11/14/2018   Deep venous thrombosis ([place]) 11/14/2018   Nicotine  dependence 11/14/2018   Hemorrhagic stroke ([place])    Diastolic dysfunction    Type 2 diabetes mellitus ([place])    Anemia of chronic disease    Chronic obstructive pulmonary disease ([place])    ICH (intracerebral hemorrhage) ([place]) 08/30/2018   Hypertensive heart disease without heart failure 07/04/2018   Nonrheumatic aortic valve insufficiency 07/04/2018   Esophageal abnormality    Class 2 severe obesity due to excess calories with serious comorbidity and body mass index (BMI) of 35.0 to 35.9 in adult [facility])    Peripheral vascular disease ([place])    History of osteomyelitis    History of amputation of lesser toe ([place])    Pulmonary embolism ([place]) 06/22/2018   Microcytic anemia 06/17/2018   At risk for adverse drug reaction 06/11/2018   Critical lower limb ischemia ([place]) 05/27/2018   GI bleeding 04/02/2018   Acute renal failure (ARF) ([place]) 04/02/2018   Hypokalemia 04/02/2018   Polysubstance abuse ([place]) 04/02/2018   Diabetic foot ulcer ([place]) 04/02/2018   Tinea pedis 03/07/2018   Diabetic peripheral neuropathy ([place]) 01/24/2018   Cannabis use disorder, moderate, dependence ([place]) 05/19/2017   Cocaine use disorder, moderate, dependence ([place]) 05/19/2017   CKD (chronic kidney disease) stage 3, GFR 30-59 ml/min ([place]) 02/17/2017   Noncompliance with medications 02/17/2017   Suicide attempt ([place]) 11/06/2016   Mild cognitive impairment 03/21/2016   Moderate episode of recurrent major depressive disorder ([place]) 03/21/2016   B12 deficiency 03/09/2016   Iron deficiency anemia 03/07/2016   History of CVA with residual deficit 12/30/2015   Schizoaffective disorder, bipolar type ([place]) 10/07/2015   OSA (obstructive sleep apnea) 06/09/2015   Vitamin D deficiency 01/06/2015   Bilateral knee pain 01/05/2015   Numbness and tingling in right hand 01/05/2015   Insomnia 12/07/2014   Right-sided lacunar infarction ([place]) 09/15/2014   Left hemiparesis ([place]) 09/15/2014    Dyspnea    Back pain 09/11/2013   Psychoactive substance-induced organic mood disorder ([place]) 05/28/2013   Cocaine abuse with cocaine-induced mood disorder ([place]) 05/28/2013   Cannabis dependence with cannabis-induced anxiety disorder ([place]) 05/28/2013   Morbid obesity with BMI of 45.0-49.9, adult ([place]) 04/25/2012   Chest  pain 04/17/2012   Hypertension 04/17/2012   DM (diabetes mellitus) with peripheral vascular complication ([place]) 81/59/4707   Hyperlipidemia 04/17/2012   Tobacco use 04/17/2012    Arliss Journey, PT, DPT  08/12/2020, 12:11 PM  [facility] [address] [place], [place], [address] Phone: [phone]   Fax:  [phone]  Name: Diamond Rice MRN: [id number] Date of Birth: 07/30/1971

## 2020-08-13 ENCOUNTER — Telehealth: Payer: Self-pay | Admitting: Physical Therapy

## 2020-08-13 NOTE — Telephone Encounter (Signed)
Faxed order to MD as provider is not in Epic:   Dr. Maia Petties, Diamond Rice was evaluated by Physical Therapy on 08/03/20.  The patient would benefit from a speech therapy evaluation for dysphagia and dysarthria after recent CVA.   If you agree, please place an order in [facility] workque in [facility] or fax the order to [phone]. Thank you, Diamond Rice, PT, DPT 08/13/20 12:46 PM    [facility] [address] [place], [place]  [address] Phone:  [phone] Fax:  [phone]

## 2020-08-17 ENCOUNTER — Ambulatory Visit: Payer: Medicare Other | Admitting: Physical Therapy

## 2020-08-17 ENCOUNTER — Other Ambulatory Visit: Payer: Self-pay

## 2020-08-17 ENCOUNTER — Encounter: Payer: Self-pay | Admitting: Physical Therapy

## 2020-08-17 VITALS — BP 160/93 | HR 89

## 2020-08-17 DIAGNOSIS — R262 Difficulty in walking, not elsewhere classified: Secondary | ICD-10-CM | POA: Diagnosis not present

## 2020-08-17 DIAGNOSIS — M6281 Muscle weakness (generalized): Secondary | ICD-10-CM

## 2020-08-17 DIAGNOSIS — R269 Unspecified abnormalities of gait and mobility: Secondary | ICD-10-CM

## 2020-08-17 DIAGNOSIS — R29818 Other symptoms and signs involving the nervous system: Secondary | ICD-10-CM

## 2020-08-17 DIAGNOSIS — R2681 Unsteadiness on feet: Secondary | ICD-10-CM

## 2020-08-17 NOTE — Patient Instructions (Signed)
Access Code: [email or web address] URL: [email or web address] Date: 08/17/2020 Prepared by: Janann August  Exercises Seated Long Arc Quad - 1 x daily - 5 x weekly - 10 reps - 2 sets Seated Heel Slide - 1 x daily - 5 x weekly - 10 reps - 2 sets Sit to Stand - 1 x daily - 5 x weekly - 5 reps - 2 sets Seated Hip Adduction Isometrics with Ball - 1 x daily - 5 x weekly - 2 sets - 10 reps

## 2020-08-17 NOTE — Therapy (Signed)
[facility] [address] [place], [place], [address] Phone: [phone]   Fax:  [phone]  Physical Therapy Treatment  Patient Details  Name: Diamond Rice MRN: [id number] Date of Birth: 23-Nov-1970 Referring Provider (PT): Johney Maine, MD   Encounter Date: 08/17/2020   PT End of Session - 08/17/20 1706    Visit Number 2    Number of Visits 17    Date for PT Re-Evaluation 11/10/20   written for 60 day POC   Authorization Type Medicare & Medicaid    PT Start Time 1616    PT Stop Time 1657    PT Time Calculation (min) 41 min    Equipment Utilized During Treatment Gait belt    Activity Tolerance Patient tolerated treatment well;Patient limited by fatigue    Behavior During Therapy [facility] for tasks assessed/performed;Flat affect           Past Medical History:  Diagnosis Date  . Anxiety   . Chronic lower back pain 04/17/2012   "just got over some; catched when I walked"  . COPD (chronic obstructive pulmonary disease) ([place])   . Critical lower limb ischemia ([place]) 05/27/2018  . Depression   . Headache(784.0) 04/17/2012   "~ qod; lately waking up in am w/one"  . High cholesterol   . Hypertension   . Migraines 04/17/2012  . Obesity   . Sleep apnea    CPAP  . Stroke ([place])   . Type II diabetes mellitus ([place]) 08/28/2002    Past Surgical History:  Procedure Laterality Date  . ABDOMINAL AORTOGRAM W/LOWER EXTREMITY Left 05/27/2018   Procedure: ABDOMINAL AORTOGRAM W/LOWER EXTREMITY [place] and Possible Intervention;  Surgeon: Waynetta Sandy, MD;  Location: [facility];  Service: Cardiovascular;  Laterality: Left;  . APPLICATION OF WOUND VAC Left 06/06/2018   Procedure: APPLICATION OF WOUND VAC;  Surgeon: Waynetta Sandy, MD;  Location: [facility];  Service: Vascular;  Laterality: Left;  . BIOPSY  04/04/2018   Procedure: BIOPSY;  Surgeon: [name], MD;  Location: [facility] ENDOSCOPY;  Service:  Endoscopy;;  . CARDIAC CATHETERIZATION  ~ 2011  . COLONOSCOPY N/A 04/04/2018   Procedure: COLONOSCOPY;  Surgeon: [name], MD;  Location: [facility] ENDOSCOPY;  Service: Endoscopy;  Laterality: N/A;  . LOWER EXTREMITY ANGIOGRAM Left 05/27/2018  . PERIPHERAL VASCULAR INTERVENTION Left 05/27/2018   Procedure: PERIPHERAL VASCULAR INTERVENTION;  Surgeon: Waynetta Sandy, MD;  Location: [facility];  Service: Cardiovascular;  Laterality: Left;  . POLYPECTOMY  04/04/2018   Procedure: POLYPECTOMY;  Surgeon: [name], MD;  Location: [facility] ENDOSCOPY;  Service: Endoscopy;;  . TRANSMETATARSAL AMPUTATION Left 05/28/2018   Procedure: AMPUTATION TOES THREE, FOUR AND FIVE on left foot;  Surgeon: Waynetta Sandy, MD;  Location: [facility];  Service: Vascular;  Laterality: Left;  . WOUND DEBRIDEMENT Left 06/06/2018   Procedure: DEBRIDEMENT WOUND LEFT FOOT;  Surgeon: Waynetta Sandy, MD;  Location: [facility];  Service: Vascular;  Laterality: Left;    Vitals:   08/17/20 1624  BP: (!) 160/93  Pulse: 89     Subjective Assessment - 08/17/20 1621    Subjective No changes since she was last here. Will have to cancel thursdays appts.    Pertinent History CVA 1/20 - Right posterior basal ganglia/posterior limb internal capsule intracerebral hemorrhage, CVA 4/21 (with dysphagia/dysarthria), rt CVA (2016, residual left sided weakness), lt transmet amputation 05/28/18, Sleep apnea, COPD, smoker, DM, Bipolar, HTN, anxiety, depression, PAD BLE    Limitations  Standing;Walking    Patient Stated Goals wants to get a little stronger.    Currently in Pain? No/denies                             [facility] Adult PT Treatment/Exercise - 08/17/20 1628      Ambulation/Gait   Ambulation/Gait Yes    Ambulation/Gait Assistance 5: Supervision;4: Min guard    Ambulation/Gait Assistance Details in and out of clinic with large base quad cane    Ambulation Distance (Feet) 70 Feet   x2 (in and out  of clinic), 20 x 2   Assistive device Large base quad cane    Gait Pattern Decreased stance time - left;Step-to pattern;Decreased arm swing - left;Decreased step length - right;Decreased hip/knee flexion - left;Decreased dorsiflexion - left;Decreased weight shift to left;Poor foot clearance - left   L toe drag   Ambulation Surface Level;Indoor    Gait velocity 28.16 seconds in 20 ft = .71 ft/sec      Exercises   Exercises Other Exercises    Other Exercises  isometric seated hip ABD with use of gait belt around thighs to press out into 2 x 10 reps              Access Code: [email or web address] URL: [email or web address] Date: 08/17/2020 Prepared by: Janann August  Exercises Seated Long Arc Quad - 1 x daily - 5 x weekly - 10 reps - 2 sets Seated Heel Slide - 1 x daily - 5 x weekly - 10 reps - 2 sets Sit to Stand - 1 x daily - 5 x weekly - 5 reps - 2 sets Seated Hip Adduction Isometrics with Ball - 1 x daily - 5 x weekly - 2 sets - 10 reps      PT Education - 08/17/20 1705    Education Details initial HEP    Person(s) Educated Patient    Methods Explanation;Demonstration    Comprehension Verbalized understanding;Returned demonstration            PT Short Term Goals - 08/17/20 1707      PT SHORT TERM GOAL #1   Title Patient will be independent with initial HEP in order to build upon functional gains in therapy. ALL STGS DUE 09/09/20    Time 4    Period Weeks    Status New    Target Date 09/09/20      PT SHORT TERM GOAL #2   Title Pt will improve gait speed to at least .9 ft/sec in order to demo improved gait efficiency.    Baseline .71 ft/sec in 20 ft on 08/17/20 with [facility]    Time 4    Period Weeks    Status Revised      PT SHORT TERM GOAL #3   Title Pt will undergo further assessment of L foot up brace vs. L AFO in order to demo improved functional mobility.    Time 4    Period Weeks    Status New      PT SHORT TERM GOAL #4   Title Patient will ambulate  at least 36' with large base quad base cane and supervision in order to improve functional ambulation at home.    Time 4    Period Weeks    Status New      PT SHORT TERM GOAL #5   Title Pt will decr TUG time with large base quad cane to 33 seconds or  less in order to demo decr fall risk.    Baseline 37.16 with large base quad cane    Time 4    Period Weeks    Status New             PT Long Term Goals - 08/17/20 1707      PT LONG TERM GOAL #1   Title Patient will be independent with final HEP in order to build upon functional gains in therapy. ALL LTGS DUE 10/07/20    Time 8    Period Weeks    Status New      PT LONG TERM GOAL #2   Title Pt will improve gait speed to at least 1.2 ft/sec in order to demo improved gait efficiency.    Baseline .71 ft/sec with [facility]    Time 8    Period Weeks    Status Revised      PT LONG TERM GOAL #3   Title Pt will decr TUG time with large base quad cane to 27 seconds or less in order to demo decr fall risk.    Baseline 37.16 with large base quad cane    Time 8    Period Weeks    Status New      PT LONG TERM GOAL #4   Title Patient will ambulate at least 115' indoors and outdoors with large base quad base cane and supervision  in order to improve tolerance for walking in and out of doctors appointments    Time 8    Period Weeks    Status New      PT LONG TERM GOAL #5   Title Patient will decrease 5x sit <> stand time to at least 22 seconds with single UE assist from chair in order to demo improved LE strength.    Baseline 28.38 seconds from chair with RUE support from chair    Time [place] - 08/17/20 1708    Clinical Impression Statement Assessed pt's gait speed today in 20 ft with [facility] - pt performing in .71 ft/sec, indicating that pt is a household ambulator and at a recurrent risk for falls. Remainder of session focused on getting pt started on HEP for seated leg strengthening.  Pt tolerated session well, will continue to progress towrads LTGs.    Personal Factors and Comorbidities Comorbidity 3+;Past/Current Experience;Time since onset of injury/illness/exacerbation;Fitness    Comorbidities CVA 4/21 and CVA 1/20, rt CVA (2016, residual left sided weakness), lt transmet amputation 05/28/18, Sleep apnea, COPD, smoker, DM, Bipolar, HTN, anxiety, depression, BLE PAD    Examination-Activity Limitations Stand;Locomotion Level;Transfers    Examination-Participation Restrictions Community Activity;Meal Prep;Cleaning    Stability/Clinical Decision Making Evolving/Moderate complexity    Rehab Potential Fair    PT Frequency 2x / week    PT Duration 8 weeks    PT Treatment/Interventions ADLs/Self Care Home Management;Gait training;DME Instruction;Stair training;Therapeutic activities;Functional mobility training;Therapeutic exercise;Balance training;Neuromuscular re-education;Patient/family education;Passive range of motion;Orthotic Fit/Training;Vestibular    PT Next Visit Plan monitor BP. strengthening/balance, activity tolerance, trial L foot up brace (pt might not have family support to help with AFO at home)    Consulted and Agree with Plan of Care Patient           Patient will benefit from skilled therapeutic intervention in order to improve the following deficits and impairments:  Abnormal  gait,Decreased balance,Decreased activity tolerance,Decreased endurance,Decreased mobility,Decreased range of motion,Decreased strength,Difficulty walking,Decreased coordination,Impaired UE functional use,Impaired tone,Impaired sensation  Visit Diagnosis: Difficulty in walking, not elsewhere classified  Muscle weakness (generalized)  Gait abnormality  Other symptoms and signs involving the nervous system  Unsteadiness on feet     Problem List Patient Active Problem List   Diagnosis Date Noted  . Acute CVA (cerebrovascular accident) ([place]) 12/06/2019  . AKI (acute kidney  injury) ([place]) 12/06/2019  . LVH (left ventricular hypertrophy) 08/10/2019  . Edema of lower extremity 11/26/2018  . Loss of appetite 11/26/2018  . Urinary incontinence 11/26/2018  . Allergic rhinitis 11/14/2018  . Deep venous thrombosis ([place]) 11/14/2018  . Nicotine dependence 11/14/2018  . Hemorrhagic stroke ([place])   . Diastolic dysfunction   . Type 2 diabetes mellitus ([place])   . Anemia of chronic disease   . Chronic obstructive pulmonary disease ([place])   . ICH (intracerebral hemorrhage) ([place]) 08/30/2018  . Hypertensive heart disease without heart failure 07/04/2018  . Nonrheumatic aortic valve insufficiency 07/04/2018  . Esophageal abnormality   . Class 2 severe obesity due to excess calories with serious comorbidity and body mass index (BMI) of 35.0 to 35.9 in adult [facility])   . Peripheral vascular disease ([place])   . History of osteomyelitis   . History of amputation of lesser toe ([place])   . Pulmonary embolism ([place]) 06/22/2018  . Microcytic anemia 06/17/2018  . At risk for adverse drug reaction 06/11/2018  . Critical lower limb ischemia ([place]) 05/27/2018  . GI bleeding 04/02/2018  . Acute renal failure (ARF) ([place]) 04/02/2018  . Hypokalemia 04/02/2018  . Polysubstance abuse ([place]) 04/02/2018  . Diabetic foot ulcer ([place]) 04/02/2018  . Tinea pedis 03/07/2018  . Diabetic peripheral neuropathy ([place]) 01/24/2018  . Cannabis use disorder, moderate, dependence ([place]) 05/19/2017  . Cocaine use disorder, moderate, dependence ([place]) 05/19/2017  . CKD (chronic kidney disease) stage 3, GFR 30-59 ml/min (HCC) 02/17/2017  . Noncompliance with medications 02/17/2017  . Suicide attempt ([place]) 11/06/2016  . Mild cognitive impairment 03/21/2016  . Moderate episode of recurrent major depressive disorder ([place]) 03/21/2016  . B12 deficiency 03/09/2016  . Iron deficiency anemia 03/07/2016  . History of CVA with residual deficit 12/30/2015  . Schizoaffective disorder, bipolar type ([place]) 10/07/2015  . OSA  (obstructive sleep apnea) 06/09/2015  . Vitamin D deficiency 01/06/2015  . Bilateral knee pain 01/05/2015  . Numbness and tingling in right hand 01/05/2015  . Insomnia 12/07/2014  . Right-sided lacunar infarction ([place]) 09/15/2014  . Left hemiparesis ([place]) 09/15/2014  . Dyspnea   . Back pain 09/11/2013  . Psychoactive substance-induced organic mood disorder ([place]) 05/28/2013  . Cocaine abuse with cocaine-induced mood disorder ([place]) 05/28/2013  . Cannabis dependence with cannabis-induced anxiety disorder ([place]) 05/28/2013  . Morbid obesity with BMI of 45.0-49.9, adult ([place]) 04/25/2012  . Chest pain 04/17/2012  . Hypertension 04/17/2012  . DM (diabetes mellitus) with peripheral vascular complication ([place]) 76/28/3151  . Hyperlipidemia 04/17/2012  . Tobacco use 04/17/2012    Arliss Journey, PT, DPT  08/17/2020, 5:10 PM  [facility] [address] [place], [place], [address] Phone: [phone]   Fax:  [phone]  Name: Don Giarrusso MRN: [id number] Date of Birth: [birth year]

## 2020-08-19 ENCOUNTER — Ambulatory Visit: Payer: Medicare Other

## 2020-08-24 ENCOUNTER — Ambulatory Visit: Payer: Medicare Other | Admitting: Neurology

## 2020-08-25 ENCOUNTER — Telehealth: Payer: Self-pay | Admitting: Physical Therapy

## 2020-08-25 ENCOUNTER — Ambulatory Visit: Payer: Medicare Other | Admitting: Rehabilitation

## 2020-08-25 ENCOUNTER — Encounter: Payer: Self-pay | Admitting: Physical Therapy

## 2020-08-25 ENCOUNTER — Other Ambulatory Visit: Payer: Self-pay

## 2020-08-25 ENCOUNTER — Encounter: Payer: Self-pay | Admitting: Rehabilitation

## 2020-08-25 DIAGNOSIS — R262 Difficulty in walking, not elsewhere classified: Secondary | ICD-10-CM

## 2020-08-25 DIAGNOSIS — R2681 Unsteadiness on feet: Secondary | ICD-10-CM

## 2020-08-25 DIAGNOSIS — R269 Unspecified abnormalities of gait and mobility: Secondary | ICD-10-CM

## 2020-08-25 DIAGNOSIS — M6281 Muscle weakness (generalized): Secondary | ICD-10-CM

## 2020-08-25 NOTE — Telephone Encounter (Signed)
Faxed to Dr. Corky Downs as patient is not in Epic:  Dr. Corky Downs,  Crist Infante has been seen by Physical Therapy at [facility].  The patient would benefit from a few referrals:  -OT referral due to LUE deficits s/p CVA. -Referral for a L AFO in order to improve gait mechanics to decrease pt's risk of falls and improve functional mobility. -Referral to [facility] for spasticity management of LUE.   If you agree, please place the orders in [facility] workque in [facility] or fax the order to [phone].   Thanks so much, Sherlie Ban, PT, DPT 08/25/20 4:48 PM    [facility] [address] [place], [place]  [address] Phone:  [phone] Fax:  [phone]

## 2020-08-25 NOTE — Therapy (Signed)
[facility] [address] [place], [place], [address] Phone: [phone]   Fax:  [phone]  Physical Therapy Treatment  Patient Details  Name: Diamond Rice MRN: [id number] Date of Birth: 04/10/1971 Referring Provider (PT): Johney Maine, MD   Encounter Date: 08/25/2020   PT End of Session - 08/25/20 1830    Visit Number 3    Number of Visits 17    Date for PT Re-Evaluation 11/10/20   written for 60 day POC   Authorization Type Medicare & Medicaid    PT Start Time [id number]    PT Stop Time 1530    PT Time Calculation (min) 45 min    Equipment Utilized During Treatment Gait belt    Activity Tolerance Patient tolerated treatment well;Patient limited by fatigue    Behavior During Therapy [facility] for tasks assessed/performed;Flat affect           Past Medical History:  Diagnosis Date   Anxiety    Chronic lower back pain 04/17/2012   "just got over some; catched when I walked"   COPD (chronic obstructive pulmonary disease) ([place])    Critical lower limb ischemia ([place]) 05/27/2018   Depression    Headache(784.0) 04/17/2012   "~ qod; lately waking up in am w/one"   High cholesterol    Hypertension    Migraines 04/17/2012   Obesity    Sleep apnea    CPAP   Stroke ([place])    Type II diabetes mellitus ([place]) 08/28/2002    Past Surgical History:  Procedure Laterality Date   ABDOMINAL AORTOGRAM W/LOWER EXTREMITY Left 05/27/2018   Procedure: ABDOMINAL AORTOGRAM W/LOWER EXTREMITY [place] and Possible Intervention;  Surgeon: Waynetta Sandy, MD;  Location: [place] [facility];  Service: Cardiovascular;  Laterality: Left;   APPLICATION OF WOUND VAC Left 06/06/2018   Procedure: APPLICATION OF WOUND VAC;  Surgeon: Waynetta Sandy, MD;  Location: [facility];  Service: Vascular;  Laterality: Left;   BIOPSY  04/04/2018   Procedure: BIOPSY;  Surgeon: [name], MD;  Location: [facility] ENDOSCOPY;  Service:  Endoscopy;;   CARDIAC CATHETERIZATION  ~ 2011   COLONOSCOPY N/A 04/04/2018   Procedure: COLONOSCOPY;  Surgeon: [name], MD;  Location: [facility] ENDOSCOPY;  Service: Endoscopy;  Laterality: N/A;   LOWER EXTREMITY ANGIOGRAM Left 05/27/2018   PERIPHERAL VASCULAR INTERVENTION Left 05/27/2018   Procedure: PERIPHERAL VASCULAR INTERVENTION;  Surgeon: Waynetta Sandy, MD;  Location: [place] [facility];  Service: Cardiovascular;  Laterality: Left;   POLYPECTOMY  04/04/2018   Procedure: POLYPECTOMY;  Surgeon: [name], MD;  Location: [facility] ENDOSCOPY;  Service: Endoscopy;;   TRANSMETATARSAL AMPUTATION Left 05/28/2018   Procedure: AMPUTATION TOES THREE, FOUR AND FIVE on left foot;  Surgeon: Waynetta Sandy, MD;  Location: [facility];  Service: Vascular;  Laterality: Left;   WOUND DEBRIDEMENT Left 06/06/2018   Procedure: DEBRIDEMENT WOUND LEFT FOOT;  Surgeon: Waynetta Sandy, MD;  Location: [facility];  Service: Vascular;  Laterality: Left;    There were no vitals filed for this visit.   Subjective Assessment - 08/25/20 1449    Subjective Pt reports pain in LEs.  She also reports feeling depressed today as her brother passed away 2 days before Christmas and her and her siblings are trying to pay for funeral. She also reports she will have to move out of her apartment in April and find a place to stay or become homeless.    Pertinent History CVA 1/20 - Right posterior basal  ganglia/posterior limb internal capsule intracerebral hemorrhage, CVA 4/21 (with dysphagia/dysarthria), rt CVA (2016, residual left sided weakness), lt transmet amputation 05/28/18, Sleep apnea, COPD, smoker, DM, Bipolar, HTN, anxiety, depression, PAD BLE    Currently in Pain? Yes    Pain Score 7     Pain Location Leg    Pain Orientation Right;Left    Pain Descriptors / Indicators Other (Comment)   pulling   Pain Type Chronic pain    Pain Onset 1 to 4 weeks ago    Pain Frequency Constant    Aggravating Factors   nothing    Pain Relieving Factors nothing                             [facility] Adult PT Treatment/Exercise - 08/25/20 1450      Transfers   Transfers Sit to Stand;Stand to Sit    Comments x 10 reps for BLE strength with emphasis on midline posture and equal WB.      Ambulation/Gait   Ambulation/Gait Yes    Ambulation/Gait Assistance 5: Supervision;4: Min guard    Ambulation/Gait Assistance Details Into and out of clinic with [facility] x 40' x 2.  During session trialed foot up brace and 2 AFOs to decide which would be most appropriate for pt.  Foot up brace trial first and pt ambulated x 65'.  Note that this support helped very little still with prominent foot drag.  Then trialed allard toe off brace (purely due to anterior is easier to don as pt lives alone).  This did help a lot at her ankle, however due to being anterior and poor quad control, it would force knee into hyperextension.  Then trialed [place] walk on PLS AFO with toe cap.  This looked very good, but she may need a little more support from a [place] AFO.  Primary PT brought in and will contact [facility] for consult.    Ambulation Distance (Feet) 40 Feet   x 2 reps, then 65' x 3 reps   Assistive device Large base quad cane    Gait Pattern Decreased stance time - left;Step-to pattern;Decreased arm swing - left;Decreased step length - right;Decreased hip/knee flexion - left;Decreased dorsiflexion - left;Decreased weight shift to left;Poor foot clearance - left    Ambulation Surface Level;Indoor      Self-Care   Self-Care Other Self-Care Comments    Other Self-Care Comments  PT discussed providing resources for pt regarding assistance with housing and other resources in community.  PT to check and see if CVA support group being held at this time due to Covid.  Provided emotional support during session as she did become tearful talking about brother and her housing situation.  Also discussed with primary PT during session.   Feel that she may benefit from OT evaluation as well as referral to PM&R for tone management/possible neuropsych consult.                    PT Short Term Goals - 08/17/20 1707      PT SHORT TERM GOAL #1   Title Patient will be independent with initial HEP in order to build upon functional gains in therapy. ALL STGS DUE 09/09/20    Time 4    Period Weeks    Status New    Target Date 09/09/20      PT SHORT TERM GOAL #2   Title Pt will improve gait speed to at  least .9 ft/sec in order to demo improved gait efficiency.    Baseline .71 ft/sec in 20 ft on 08/17/20 with [facility]    Time 4    Period Weeks    Status Revised      PT SHORT TERM GOAL #3   Title Pt will undergo further assessment of L foot up brace vs. L AFO in order to demo improved functional mobility.    Time 4    Period Weeks    Status New      PT SHORT TERM GOAL #4   Title Patient will ambulate at least 29' with large base quad base cane and supervision in order to improve functional ambulation at home.    Time 4    Period Weeks    Status New      PT SHORT TERM GOAL #5   Title Pt will decr TUG time with large base quad cane to 33 seconds or less in order to demo decr fall risk.    Baseline 37.16 with large base quad cane    Time 4    Period Weeks    Status New             PT Long Term Goals - 08/17/20 1707      PT LONG TERM GOAL #1   Title Patient will be independent with final HEP in order to build upon functional gains in therapy. ALL LTGS DUE 10/07/20    Time 8    Period Weeks    Status New      PT LONG TERM GOAL #2   Title Pt will improve gait speed to at least 1.2 ft/sec in order to demo improved gait efficiency.    Baseline .71 ft/sec with [facility]    Time 8    Period Weeks    Status Revised      PT LONG TERM GOAL #3   Title Pt will decr TUG time with large base quad cane to 27 seconds or less in order to demo decr fall risk.    Baseline 37.16 with large base quad cane    Time 8     Period Weeks    Status New      PT LONG TERM GOAL #4   Title Patient will ambulate at least 115' indoors and outdoors with large base quad base cane and supervision  in order to improve tolerance for walking in and out of doctors appointments    Time 8    Period Weeks    Status New      PT LONG TERM GOAL #5   Title Patient will decrease 5x sit <> stand time to at least 22 seconds with single UE assist from chair in order to demo improved LE strength.    Baseline 28.38 seconds from chair with RUE support from chair    Time [place] - 08/25/20 1830    Clinical Impression Statement Skilled session focused on gait assessment with varying AFOs/support during session.  Feel that she will benefit from a consult from orthotist in future session.  We will need to make sure she can don independently.  Also will provide community resouces to pt regarding housing assistance.    Personal Factors and Comorbidities Comorbidity 3+;Past/Current Experience;Time since onset of injury/illness/exacerbation;Fitness    Comorbidities CVA 4/21 and CVA 1/20, rt  CVA (2016, residual left sided weakness), lt transmet amputation 05/28/18, Sleep apnea, COPD, smoker, DM, Bipolar, HTN, anxiety, depression, BLE PAD    Examination-Activity Limitations Stand;Locomotion Level;Transfers    Examination-Participation Restrictions Community Activity;Meal Prep;Cleaning    Stability/Clinical Decision Making Evolving/Moderate complexity    Rehab Potential Fair    PT Frequency 2x / week    PT Duration 8 weeks    PT Treatment/Interventions ADLs/Self Care Home Management;Gait training;DME Instruction;Stair training;Therapeutic activities;Functional mobility training;Therapeutic exercise;Balance training;Neuromuscular re-education;Patient/family education;Passive range of motion;Orthotic Fit/Training;Vestibular    PT Next Visit Plan monitor BP. Trial [place] PLS AFO with toe cap,  strengthening/balance, activity tolerance, trial L foot up brace (pt might not have family support to help with AFO at home)    PT Home Exercise Plan Provide info on community resources, get [facility] to come for consult, PM&R referral, OT referral    Consulted and Agree with Plan of Care Patient           Patient will benefit from skilled therapeutic intervention in order to improve the following deficits and impairments:  Abnormal gait,Decreased balance,Decreased activity tolerance,Decreased endurance,Decreased mobility,Decreased range of motion,Decreased strength,Difficulty walking,Decreased coordination,Impaired UE functional use,Impaired tone,Impaired sensation  Visit Diagnosis: Difficulty in walking, not elsewhere classified  Muscle weakness (generalized)  Gait abnormality  Unsteadiness on feet     Problem List Patient Active Problem List   Diagnosis Date Noted   Acute CVA (cerebrovascular accident) (HCC) 12/06/2019   AKI (acute kidney injury) (HCC) 12/06/2019   LVH (left ventricular hypertrophy) 08/10/2019   Edema of lower extremity 11/26/2018   Loss of appetite 11/26/2018   Urinary incontinence 11/26/2018   Allergic rhinitis 11/14/2018   Deep venous thrombosis (HCC) 11/14/2018   Nicotine dependence 11/14/2018   Hemorrhagic stroke (HCC)    Diastolic dysfunction    Type 2 diabetes mellitus (HCC)    Anemia of chronic disease    Chronic obstructive pulmonary disease (HCC)    ICH (intracerebral hemorrhage) (HCC) 08/30/2018   Hypertensive heart disease without heart failure 07/04/2018   Nonrheumatic aortic valve insufficiency 07/04/2018   Esophageal abnormality    Class 2 severe obesity due to excess calories with serious comorbidity and body mass index (BMI) of 35.0 to 35.9 in adult [facility](HCC)    Peripheral vascular disease (HCC)    History of osteomyelitis    History of amputation of lesser toe (HCC)    Pulmonary embolism (HCC) 06/22/2018    Microcytic anemia 06/17/2018   At risk for adverse drug reaction 06/11/2018   Critical lower limb ischemia (HCC) 05/27/2018   GI bleeding 04/02/2018   Acute renal failure (ARF) (HCC) 04/02/2018   Hypokalemia 04/02/2018   Polysubstance abuse (HCC) 04/02/2018   Diabetic foot ulcer (HCC) 04/02/2018   Tinea pedis 03/07/2018   Diabetic peripheral neuropathy (HCC) 01/24/2018   Cannabis use disorder, moderate, dependence (HCC) 05/19/2017   Cocaine use disorder, moderate, dependence (HCC) 05/19/2017   CKD (chronic kidney disease) stage 3, GFR 30-59 ml/min (HCC) 02/17/2017   Noncompliance with medications 02/17/2017   Suicide attempt (HCC) 11/06/2016   Mild cognitive impairment 03/21/2016   Moderate episode of recurrent major depressive disorder (HCC) 03/21/2016   B12 deficiency 03/09/2016   Iron deficiency anemia 03/07/2016   History of CVA with residual deficit 12/30/2015   Schizoaffective disorder, bipolar type (HCC) 10/07/2015   OSA (obstructive sleep apnea) 06/09/2015   Vitamin D deficiency 01/06/2015   Bilateral knee pain 01/05/2015   Numbness and tingling in right hand 01/05/2015   Insomnia 12/07/2014  Right-sided lacunar infarction ([place]) 09/15/2014   Left hemiparesis ([place]) 09/15/2014   Dyspnea    Back pain 09/11/2013   Psychoactive substance-induced organic mood disorder ([place]) 05/28/2013   Cocaine abuse with cocaine-induced mood disorder ([place]) 05/28/2013   Cannabis dependence with cannabis-induced anxiety disorder ([place]) 05/28/2013   Morbid obesity with BMI of 45.0-49.9, adult ([place]) 04/25/2012   Chest pain 04/17/2012   Hypertension 04/17/2012   DM (diabetes mellitus) with peripheral vascular complication ([place]) [id number]   Hyperlipidemia 04/17/2012   Tobacco use 04/17/2012    Cameron Sprang, PT, MPT [facility] [address] [place], [place], [address] Phone: [phone]   Fax:   [phone] 08/25/20, 6:35 PM  Name: Soraida Chanley MRN: [id number] Date of Birth: 1970/10/17

## 2020-08-31 ENCOUNTER — Encounter: Payer: Self-pay | Admitting: Physical Therapy

## 2020-08-31 ENCOUNTER — Other Ambulatory Visit: Payer: Self-pay

## 2020-08-31 ENCOUNTER — Ambulatory Visit: Payer: Medicare Other | Attending: Internal Medicine | Admitting: Physical Therapy

## 2020-08-31 VITALS — BP 130/83 | HR 77

## 2020-08-31 DIAGNOSIS — R262 Difficulty in walking, not elsewhere classified: Secondary | ICD-10-CM | POA: Diagnosis present

## 2020-08-31 DIAGNOSIS — R29818 Other symptoms and signs involving the nervous system: Secondary | ICD-10-CM | POA: Insufficient documentation

## 2020-08-31 DIAGNOSIS — R269 Unspecified abnormalities of gait and mobility: Secondary | ICD-10-CM | POA: Diagnosis present

## 2020-08-31 DIAGNOSIS — I69354 Hemiplegia and hemiparesis following cerebral infarction affecting left non-dominant side: Secondary | ICD-10-CM | POA: Insufficient documentation

## 2020-08-31 DIAGNOSIS — R2689 Other abnormalities of gait and mobility: Secondary | ICD-10-CM | POA: Diagnosis present

## 2020-08-31 DIAGNOSIS — M6281 Muscle weakness (generalized): Secondary | ICD-10-CM | POA: Insufficient documentation

## 2020-08-31 DIAGNOSIS — R2681 Unsteadiness on feet: Secondary | ICD-10-CM | POA: Insufficient documentation

## 2020-08-31 NOTE — Therapy (Signed)
[facility] [address] [place], [place], [address] Phone: [phone]   Fax:  [phone]  Physical Therapy Treatment  Patient Details  Name: Diamond Rice MRN: [id number] Date of Birth: 11-02-1970 Referring Provider (PT): Johney Maine, MD   Encounter Date: 08/31/2020   PT End of Session - 08/31/20 1600    Visit Number 4    Number of Visits 17    Date for PT Re-Evaluation 11/10/20   written for 60 day POC   Authorization Type Medicare & Medicaid    PT Start Time [id number]    PT Stop Time 1532    PT Time Calculation (min) 47 min    Equipment Utilized During Treatment Gait belt    Activity Tolerance Patient tolerated treatment well;Patient limited by fatigue    Behavior During Therapy [facility] for tasks assessed/performed;Flat affect           Past Medical History:  Diagnosis Date  . Anxiety   . Chronic lower back pain 04/17/2012   "just got over some; catched when I walked"  . COPD (chronic obstructive pulmonary disease) ([place])   . Critical lower limb ischemia ([place]) 05/27/2018  . Depression   . Headache(784.0) 04/17/2012   "~ qod; lately waking up in am w/one"  . High cholesterol   . Hypertension   . Migraines 04/17/2012  . Obesity   . Sleep apnea    CPAP  . Stroke ([place])   . Type II diabetes mellitus ([place]) 08/28/2002    Past Surgical History:  Procedure Laterality Date  . ABDOMINAL AORTOGRAM W/LOWER EXTREMITY Left 05/27/2018   Procedure: ABDOMINAL AORTOGRAM W/LOWER EXTREMITY Run[place] and Possible Intervention;  Surgeon: Waynetta Sandy, MD;  Location: [facility];  Service: Cardiovascular;  Laterality: Left;  . APPLICATION OF WOUND VAC Left 06/06/2018   Procedure: APPLICATION OF WOUND VAC;  Surgeon: Waynetta Sandy, MD;  Location: [facility];  Service: Vascular;  Laterality: Left;  . BIOPSY  04/04/2018   Procedure: BIOPSY;  Surgeon: [name], MD;  Location[facility];  Service:  Endoscopy;;  . CARDIAC CATHETERIZATION  ~ 2011  . COLONOSCOPY N/A 04/04/2018   Procedure: COLONOSCOPY;  Surgeon: [name], MD;  Location: [facility] ENDOSCOPY;  Service: Endoscopy;  Laterality: N/A;  . LOWER EXTREMITY ANGIOGRAM Left 05/27/2018  . PERIPHERAL VASCULAR INTERVENTION Left 05/27/2018   Procedure: PERIPHERAL VASCULAR INTERVENTION;  Surgeon: Waynetta Sandy, MD;  Location: [facility];  Service: Cardiovascular;  Laterality: Left;  . POLYPECTOMY  04/04/2018   Procedure: POLYPECTOMY;  Surgeon: [name], MD;  Location: [facility] ENDOSCOPY;  Service: Endoscopy;;  . TRANSMETATARSAL AMPUTATION Left 05/28/2018   Procedure: AMPUTATION TOES THREE, FOUR AND FIVE on left foot;  Surgeon: Waynetta Sandy, MD;  Location: [facility];  Service: Vascular;  Laterality: Left;  . WOUND DEBRIDEMENT Left 06/06/2018   Procedure: DEBRIDEMENT WOUND LEFT FOOT;  Surgeon: Waynetta Sandy, MD;  Location: [facility];  Service: Vascular;  Laterality: Left;    Vitals:   08/31/20 1518  BP: 130/83  Pulse: 77     Subjective Assessment - 08/31/20 1516    Subjective No falls. Reports when she takes her BP medication it makes her feel a little drowsy.    Pertinent History CVA 1/20 - Right posterior basal ganglia/posterior limb internal capsule intracerebral hemorrhage, CVA 4/21 (with dysphagia/dysarthria), rt CVA (2016, residual left sided weakness), lt transmet amputation 05/28/18, Sleep apnea, COPD, smoker, DM, Bipolar, HTN, anxiety, depression, PAD BLE  Currently in Pain? Yes    Pain Score 9     Pain Location Leg    Pain Orientation Right;Left    Pain Descriptors / Indicators Aching    Pain Type Chronic pain    Pain Onset 1 to 4 weeks ago    Aggravating Factors  nothing    Pain Relieving Factors nothing                             [facility] Adult PT Treatment/Exercise - 08/31/20 1602      Ambulation/Gait   Ambulation/Gait Yes    Ambulation/Gait Assistance 5:  Supervision;4: Min guard    Ambulation/Gait Assistance Details pt ambulating into clinic with large base quad cane, needing min guard at times due to significant L toe drag during gait. at end of session ambulated pt out to clinic lobby with pt still wearing L ottobock walk on AFO for incr safety with gait    Ambulation Distance (Feet) 70 Feet   x2, 30 x 2   Assistive device Large base quad cane    Gait Pattern Decreased stance time - left;Step-to pattern;Decreased arm swing - left;Decreased step length - right;Decreased hip/knee flexion - left;Decreased dorsiflexion - left;Decreased weight shift to left;Poor foot clearance - left    Ambulation Surface Level;Indoor    Gait Comments orthotist, Gerald Stabs, present throughout today's session today, trialed [place] posterior L AFO with use of blue shoe cover as a toe cap - pt demonstrating a couple instances of L hyperextension, trialed the ottobock walk on PLS AFO with pt demonstrating improved foot clearance with a less rigid brace. orthotist recommending the ottobock PLS L AFO to improve safety and gait mechanics. Pt's sneakers are currently laces and pt normally slips her L foot in and out of the shoe and pt reporting the AFO feeling more snug in her shoe. Recommending a wider velco shoe for pt in order for pt to safely don/doff herself with AFO (pt does not have support at home to help with lace shoe). Pt reporting she has no way of getting to the store/any additional help to get new shoe. Orthotist stating he will look around to see if there are any extra shoes laying around and if so will bring to clinic      Self-Care   Self-Care Other Self-Care Comments    Other Self-Care Comments  PT provided handout to pt regarding housing and other and other resources in community. Discussed OT referral coming in and pt needing to be scheduled and pt will need an MBS before ST eval is scheduled. provided pt with phone number to call to reschedule her appt with Dr. Leonie Man  (as she had to cancel last weeks appt with [facility])                  PT Education - 08/31/20 1600    Education Details orthotist present during todays session, recommending an ottobock walk-on PLS brace for LLE to improve safety during gait. see self-care.    Person(s) Educated Patient    Methods Explanation;Handout    Comprehension Verbalized understanding            PT Short Term Goals - 08/17/20 1707      PT SHORT TERM GOAL #1   Title Patient will be independent with initial HEP in order to build upon functional gains in therapy. ALL STGS DUE 09/09/20    Time 4    Period  Weeks    Status New    Target Date 09/09/20      PT SHORT TERM GOAL #2   Title Pt will improve gait speed to at least .9 ft/sec in order to demo improved gait efficiency.    Baseline .71 ft/sec in 20 ft on 08/17/20 with [facility]    Time 4    Period Weeks    Status Revised      PT SHORT TERM GOAL #3   Title Pt will undergo further assessment of L foot up brace vs. L AFO in order to demo improved functional mobility.    Time 4    Period Weeks    Status New      PT SHORT TERM GOAL #4   Title Patient will ambulate at least 29' with large base quad base cane and supervision in order to improve functional ambulation at home.    Time 4    Period Weeks    Status New      PT SHORT TERM GOAL #5   Title Pt will decr TUG time with large base quad cane to 33 seconds or less in order to demo decr fall risk.    Baseline 37.16 with large base quad cane    Time 4    Period Weeks    Status New             PT Long Term Goals - 08/17/20 1707      PT LONG TERM GOAL #1   Title Patient will be independent with final HEP in order to build upon functional gains in therapy. ALL LTGS DUE 10/07/20    Time 8    Period Weeks    Status New      PT LONG TERM GOAL #2   Title Pt will improve gait speed to at least 1.2 ft/sec in order to demo improved gait efficiency.    Baseline .71 ft/sec with [facility]    Time 8     Period Weeks    Status Revised      PT LONG TERM GOAL #3   Title Pt will decr TUG time with large base quad cane to 27 seconds or less in order to demo decr fall risk.    Baseline 37.16 with large base quad cane    Time 8    Period Weeks    Status New      PT LONG TERM GOAL #4   Title Patient will ambulate at least 115' indoors and outdoors with large base quad base cane and supervision  in order to improve tolerance for walking in and out of doctors appointments    Time 8    Period Weeks    Status New      PT LONG TERM GOAL #5   Title Patient will decrease 5x sit <> stand time to at least 22 seconds with single UE assist from chair in order to demo improved LE strength.    Baseline 28.38 seconds from chair with RUE support from chair    Time 8    Period Weeks    Status New                 Plan - 08/31/20 1611    Clinical Impression Statement Orthotist, Thayer Ohm, present throughout today's session to assess for AFO. Recommending a Ottobock walk on PLS AFO for LLE to help assist with foot clearance during gait. Orthotist to check to see if they have an extra pair of  velcro shoes for pt to trial (pt currently wearing sneakers with laces that are too tight with brace and does not have resources to purchase a pair of new shoes). Will continue to progress towards LTGs.    Personal Factors and Comorbidities Comorbidity 3+;Past/Current Experience;Time since onset of injury/illness/exacerbation;Fitness    Comorbidities CVA 4/21 and CVA 1/20, rt CVA (2016, residual left sided weakness), lt transmet amputation 05/28/18, Sleep apnea, COPD, smoker, DM, Bipolar, HTN, anxiety, depression, BLE PAD    Examination-Activity Limitations Stand;Locomotion Level;Transfers    Examination-Participation Restrictions Community Activity;Meal Prep;Cleaning    Stability/Clinical Decision Making Evolving/Moderate complexity    Rehab Potential Fair    PT Frequency 2x / week    PT Duration 8 weeks    PT  Treatment/Interventions ADLs/Self Care Home Management;Gait training;DME Instruction;Stair training;Therapeutic activities;Functional mobility training;Therapeutic exercise;Balance training;Neuromuscular re-education;Patient/family education;Passive range of motion;Orthotic Fit/Training;Vestibular    PT Next Visit Plan monitor BP. continue use of L ottobock walk on AFO in clinic (if chris has brought in bigger shoes for pt to try), LLE strengthening, standing balance, can try NuStep/SciFit.    PT Home Exercise Plan PM&R referral, OT referral    Consulted and Agree with Plan of Care Patient           Patient will benefit from skilled therapeutic intervention in order to improve the following deficits and impairments:  Abnormal gait,Decreased balance,Decreased activity tolerance,Decreased endurance,Decreased mobility,Decreased range of motion,Decreased strength,Difficulty walking,Decreased coordination,Impaired UE functional use,Impaired tone,Impaired sensation  Visit Diagnosis: Difficulty in walking, not elsewhere classified  Muscle weakness (generalized)  Gait abnormality  Unsteadiness on feet  Other symptoms and signs involving the nervous system     Problem List Patient Active Problem List   Diagnosis Date Noted  . Acute CVA (cerebrovascular accident) ([place]) 12/06/2019  . AKI (acute kidney injury) ([place]) 12/06/2019  . LVH (left ventricular hypertrophy) 08/10/2019  . Edema of lower extremity 11/26/2018  . Loss of appetite 11/26/2018  . Urinary incontinence 11/26/2018  . Allergic rhinitis 11/14/2018  . Deep venous thrombosis ([place]) 11/14/2018  . Nicotine dependence 11/14/2018  . Hemorrhagic stroke ([place])   . Diastolic dysfunction   . Type 2 diabetes mellitus ([place])   . Anemia of chronic disease   . Chronic obstructive pulmonary disease ([place])   . ICH (intracerebral hemorrhage) ([place]) 08/30/2018  . Hypertensive heart disease without heart failure 07/04/2018  . Nonrheumatic aortic  valve insufficiency 07/04/2018  . Esophageal abnormality   . Class 2 severe obesity due to excess calories with serious comorbidity and body mass index (BMI) of 35.0 to 35.9 in adult [facility])   . Peripheral vascular disease ([place])   . History of osteomyelitis   . History of amputation of lesser toe ([place])   . Pulmonary embolism ([place]) 06/22/2018  . Microcytic anemia 06/17/2018  . At risk for adverse drug reaction 06/11/2018  . Critical lower limb ischemia ([place]) 05/27/2018  . GI bleeding 04/02/2018  . Acute renal failure (ARF) ([place]) 04/02/2018  . Hypokalemia 04/02/2018  . Polysubstance abuse ([place]) 04/02/2018  . Diabetic foot ulcer ([place]) 04/02/2018  . Tinea pedis 03/07/2018  . Diabetic peripheral neuropathy ([place]) 01/24/2018  . Cannabis use disorder, moderate, dependence ([place]) 05/19/2017  . Cocaine use disorder, moderate, dependence ([place]) 05/19/2017  . CKD (chronic kidney disease) stage 3, GFR 30-59 ml/min (H[facility]) 02/17/2017  . Noncompliance with medications 02/17/2017  . Suicide attempt ([place]) 11/06/2016  . Mild cognitive impairment 03/21/2016  . Moderate episode of recurrent major depressive disorder ([place]) 03/21/2016  . B12  deficiency 03/09/2016  . Iron deficiency anemia 03/07/2016  . History of CVA with residual deficit 12/30/2015  . Schizoaffective disorder, bipolar type (HCC) 10/07/2015  . OSA (obstructive sleep apnea) 06/09/2015  . Vitamin D deficiency 01/06/2015  . Bilateral knee pain 01/05/2015  . Numbness and tingling in right hand 01/05/2015  . Insomnia 12/07/2014  . Right-sided lacunar infarction (HCC) 09/15/2014  . Left hemiparesis (HCC) 09/15/2014  . Dyspnea   . Back pain 09/11/2013  . Psychoactive substance-induced organic mood disorder (HCC) 05/28/2013  . Cocaine abuse with cocaine-induced mood disorder (HCC) 05/28/2013  . Cannabis dependence with cannabis-induced anxiety disorder (HCC) 05/28/2013  . Morbid obesity with BMI of 45.0-49.9, adult (HCC) 04/25/2012  . Chest  pain 04/17/2012  . Hypertension 04/17/2012  . DM (diabetes mellitus) with peripheral vascular complication (HCC) 04/17/2012  . Hyperlipidemia 04/17/2012  . Tobacco use 04/17/2012    Drake Leach, PT, DPT  08/31/2020, 4:21 PM  [facility] [address] [place], [place], [address] Phone: [phone]   Fax:  [phone]  Name: Diamond Rice MRN: [id number] Date of Birth: 15-Jul-1971

## 2020-09-02 ENCOUNTER — Other Ambulatory Visit: Payer: Self-pay

## 2020-09-02 ENCOUNTER — Ambulatory Visit: Payer: Medicare Other

## 2020-09-02 VITALS — BP 158/92

## 2020-09-02 DIAGNOSIS — M6281 Muscle weakness (generalized): Secondary | ICD-10-CM

## 2020-09-02 DIAGNOSIS — R2689 Other abnormalities of gait and mobility: Secondary | ICD-10-CM

## 2020-09-02 DIAGNOSIS — R262 Difficulty in walking, not elsewhere classified: Secondary | ICD-10-CM | POA: Diagnosis not present

## 2020-09-02 NOTE — Therapy (Signed)
[facility] [address] [place], [place], [address] Phone: [phone]   Fax:  [phone]  Physical Therapy Treatment  Patient Details  Name: Diamond Rice MRN: [id number] Date of Birth: June 05, 1971 Referring Provider (PT): Johney Maine, MD   Encounter Date: 09/02/2020   PT End of Session - 09/02/20 1450    Visit Number 5    Number of Visits 17    Date for PT Re-Evaluation 11/10/20   written for 60 day POC   Authorization Type Medicare & Medicaid    PT Start Time 1448    PT Stop Time 1527    PT Time Calculation (min) 39 min    Equipment Utilized During Treatment Gait belt    Activity Tolerance Patient tolerated treatment well;Patient limited by fatigue    Behavior During Therapy [facility] for tasks assessed/performed;Flat affect           Past Medical History:  Diagnosis Date  . Anxiety   . Chronic lower back pain 04/17/2012   "just got over some; catched when I walked"  . COPD (chronic obstructive pulmonary disease) ([place])   . Critical lower limb ischemia ([place]) 05/27/2018  . Depression   . Headache(784.0) 04/17/2012   "~ qod; lately waking up in am w/one"  . High cholesterol   . Hypertension   . Migraines 04/17/2012  . Obesity   . Sleep apnea    CPAP  . Stroke ([place])   . Type II diabetes mellitus ([place]) 08/28/2002    Past Surgical History:  Procedure Laterality Date  . ABDOMINAL AORTOGRAM W/LOWER EXTREMITY Left 05/27/2018   Procedure: ABDOMINAL AORTOGRAM W/LOWER EXTREMITY Runoff and Possible Intervention;  Surgeon: Waynetta Sandy, MD;  Location: [facility];  Service: Cardiovascular;  Laterality: Left;  . APPLICATION OF WOUND VAC Left 06/06/2018   Procedure: APPLICATION OF WOUND VAC;  Surgeon: Waynetta Sandy, MD;  Location: [facility];  Service: Vascular;  Laterality: Left;  . BIOPSY  04/04/2018   Procedure: BIOPSY;  Surgeon: [name], MD;  Location: [facility] ENDOSCOPY;  Service:  Endoscopy;;  . CARDIAC CATHETERIZATION  ~ 2011  . COLONOSCOPY N/A 04/04/2018   Procedure: COLONOSCOPY;  Surgeon: [name], MD;  Location: [facility] ENDOSCOPY;  Service: Endoscopy;  Laterality: N/A;  . LOWER EXTREMITY ANGIOGRAM Left 05/27/2018  . PERIPHERAL VASCULAR INTERVENTION Left 05/27/2018   Procedure: PERIPHERAL VASCULAR INTERVENTION;  Surgeon: Waynetta Sandy, MD;  Location: [facility];  Service: Cardiovascular;  Laterality: Left;  . POLYPECTOMY  04/04/2018   Procedure: POLYPECTOMY;  Surgeon: [name], MD;  Location: [facility] ENDOSCOPY;  Service: Endoscopy;;  . TRANSMETATARSAL AMPUTATION Left 05/28/2018   Procedure: AMPUTATION TOES THREE, FOUR AND FIVE on left foot;  Surgeon: Waynetta Sandy, MD;  Location: [facility];  Service: Vascular;  Laterality: Left;  . WOUND DEBRIDEMENT Left 06/06/2018   Procedure: DEBRIDEMENT WOUND LEFT FOOT;  Surgeon: Waynetta Sandy, MD;  Location: [facility] OR;  Service: Vascular;  Laterality: Left;    Vitals:   09/02/20 1451  BP: (!) 158/92     Subjective Assessment - 09/02/20 1450    Subjective Pt denies any new issues. Pt's BP running a little high and said it's time for another clonidine so took one at start of session.    Pertinent History CVA 1/20 - Right posterior basal ganglia/posterior limb internal capsule intracerebral hemorrhage, CVA 4/21 (with dysphagia/dysarthria), rt CVA (2016, residual left sided weakness), lt transmet amputation 05/28/18, Sleep apnea, COPD, smoker, DM, Bipolar,  HTN, anxiety, depression, PAD BLE    Currently in Pain? No/denies    Pain Onset 1 to 4 weeks ago                             [facility] Adult PT Treatment/Exercise - 09/02/20 1454      Transfers   Transfers Sit to Stand;Stand to Sit    Sit to Stand 6: Modified independent (Device/Increase time)    Stand to Sit 6: Modified independent (Device/Increase time)      Ambulation/Gait   Ambulation/Gait Yes    Ambulation/Gait  Assistance 5: Supervision;4: Min guard    Ambulation/Gait Assistance Details Initial lap with left PLS walk on ottobock AFO and then added blue shoe cover on toe area of left foot for second lap. PT gave verbal cues to increase left hip flexion and helped to facilitate some pelvic rotation on left to advance. Pt was able to better clear left foot with AFO with shoe cover.    Ambulation Distance (Feet) 100 Feet   x 2   Assistive device Large base quad cane    Gait Pattern Decreased stance time - left;Decreased hip/knee flexion - left;Decreased dorsiflexion - left;Poor foot clearance - left    Ambulation Surface Level;Indoor    Gait Comments PT trialed new shoes that orthotist had dropped off. They did have laces. Pt was able to lace one shoe in front of therapist. Had to remove thick insole of left shoe and place old insole in and then was not too tight. Fine on right foot with normal insole.      Neuro Re-ed    Neuro Re-ed Details  Pt performed standing tapping 4" step x 10 with RLE then x 7 LLE with verbal and tactile cues to try to stay up tall and not lean as much to right to raise leg. Pt was able to clear left foot.                    PT Short Term Goals - 08/17/20 1707      PT SHORT TERM GOAL #1   Title Patient will be independent with initial HEP in order to build upon functional gains in therapy. ALL STGS DUE 09/09/20    Time 4    Period Weeks    Status New    Target Date 09/09/20      PT SHORT TERM GOAL #2   Title Pt will improve gait speed to at least .9 ft/sec in order to demo improved gait efficiency.    Baseline .71 ft/sec in [facility]    Time 4    Period Weeks    Status Revised      PT SHORT TERM GOAL #3   Title Pt will undergo further assessment of L foot up brace vs. L AFO in order to demo improved functional mobility.    Time 4    Period Weeks    Status New      PT SHORT TERM GOAL #4   Title Patient will ambulate at least 72' with large  base quad base cane and supervision in order to improve functional ambulation at home.    Time 4    Period Weeks    Status New      PT SHORT TERM GOAL #5   Title Pt will decr TUG time with large base quad cane to 33 seconds or less in  order to demo decr fall risk.    Baseline 37.16 with large base quad cane    Time 4    Period Weeks    Status New             PT Long Term Goals - 08/17/20 1707      PT LONG TERM GOAL #1   Title Patient will be independent with final HEP in order to build upon functional gains in therapy. ALL LTGS DUE 10/07/20    Time 8    Period Weeks    Status New      PT LONG TERM GOAL #2   Title Pt will improve gait speed to at least 1.2 ft/sec in order to demo improved gait efficiency.    Baseline .71 ft/sec with [facility]    Time 8    Period Weeks    Status Revised      PT LONG TERM GOAL #3   Title Pt will decr TUG time with large base quad cane to 27 seconds or less in order to demo decr fall risk.    Baseline 37.16 with large base quad cane    Time 8    Period Weeks    Status New      PT LONG TERM GOAL #4   Title Patient will ambulate at least 115' indoors and outdoors with large base quad base cane and supervision  in order to improve tolerance for walking in and out of doctors appointments    Time 8    Period Weeks    Status New      PT LONG TERM GOAL #5   Title Patient will decrease 5x sit <> stand time to at least 22 seconds with single UE assist from chair in order to demo improved LE strength.    Baseline 28.38 seconds from chair with RUE support from chair    Time 8    Period Weeks    Status New                 Plan - 09/02/20 1543    Clinical Impression Statement Pt did well with trial shoes that orthotist brought by. Just had to remove insole on left shoe and use old one from pt's current sneaker. Pt with improved left foot clearance with left PLS ottobock walk on AFO with shoe cover. Orthotist will add toe cap to shoes.     Personal Factors and Comorbidities Comorbidity 3+;Past/Current Experience;Time since onset of injury/illness/exacerbation;Fitness    Comorbidities CVA 4/21 and CVA 1/20, rt CVA (2016, residual left sided weakness), lt transmet amputation 05/28/18, Sleep apnea, COPD, smoker, DM, Bipolar, HTN, anxiety, depression, BLE PAD    Examination-Activity Limitations Stand;Locomotion Level;Transfers    Examination-Participation Restrictions Community Activity;Meal Prep;Cleaning    Stability/Clinical Decision Making Evolving/Moderate complexity    Rehab Potential Fair    PT Frequency 2x / week    PT Duration 8 weeks    PT Treatment/Interventions ADLs/Self Care Home Management;Gait training;DME Instruction;Stair training;Therapeutic activities;Functional mobility training;Therapeutic exercise;Balance training;Neuromuscular re-education;Patient/family education;Passive range of motion;Orthotic Fit/Training;Vestibular    PT Next Visit Plan monitor BP. continue use of L ottobock walk on AFO in clinic Thayer Ohm brought in bigger shoes that he will add toe cap to), LLE strengthening, standing balance, can try NuStep/SciFit.    PT Home Exercise Plan PM&R referral, OT referral    Consulted and Agree with Plan of Care Patient           Patient will benefit from skilled  therapeutic intervention in order to improve the following deficits and impairments:  Abnormal gait,Decreased balance,Decreased activity tolerance,Decreased endurance,Decreased mobility,Decreased range of motion,Decreased strength,Difficulty walking,Decreased coordination,Impaired UE functional use,Impaired tone,Impaired sensation  Visit Diagnosis: Other abnormalities of gait and mobility  Muscle weakness (generalized)     Problem List Patient Active Problem List   Diagnosis Date Noted  . Acute CVA (cerebrovascular accident) ([place]) 12/06/2019  . AKI (acute kidney injury) ([place]) 12/06/2019  . LVH (left ventricular hypertrophy) 08/10/2019  . Edema  of lower extremity 11/26/2018  . Loss of appetite 11/26/2018  . Urinary incontinence 11/26/2018  . Allergic rhinitis 11/14/2018  . Deep venous thrombosis ([place]) 11/14/2018  . Nicotine dependence 11/14/2018  . Hemorrhagic stroke ([place])   . Diastolic dysfunction   . Type 2 diabetes mellitus ([place])   . Anemia of chronic disease   . Chronic obstructive pulmonary disease ([place])   . ICH (intracerebral hemorrhage) ([place]) 08/30/2018  . Hypertensive heart disease without heart failure 07/04/2018  . Nonrheumatic aortic valve insufficiency 07/04/2018  . Esophageal abnormality   . Class 2 severe obesity due to excess calories with serious comorbidity and body mass index (BMI) of 35.0 to 35.9 in adult [facility])   . Peripheral vascular disease ([place])   . History of osteomyelitis   . History of amputation of lesser toe ([place])   . Pulmonary embolism ([place]) 06/22/2018  . Microcytic anemia 06/17/2018  . At risk for adverse drug reaction 06/11/2018  . Critical lower limb ischemia ([place]) 05/27/2018  . GI bleeding 04/02/2018  . Acute renal failure (ARF) ([place]) 04/02/2018  . Hypokalemia 04/02/2018  . Polysubstance abuse ([place]) 04/02/2018  . Diabetic foot ulcer ([place]) 04/02/2018  . Tinea pedis 03/07/2018  . Diabetic peripheral neuropathy ([place]) 01/24/2018  . Cannabis use disorder, moderate, dependence ([place]) 05/19/2017  . Cocaine use disorder, moderate, dependence ([place]) 05/19/2017  . CKD (chronic kidney disease) stage 3, GFR 30-59 ml/min ([facility]) 02/17/2017  . Noncompliance with medications 02/17/2017  . Suicide attempt ([place]) 11/06/2016  . Mild cognitive impairment 03/21/2016  . Moderate episode of recurrent major depressive disorder ([place]) 03/21/2016  . B12 deficiency 03/09/2016  . Iron deficiency anemia 03/07/2016  . History of CVA with residual deficit 12/30/2015  . Schizoaffective disorder, bipolar type ([place]) 10/07/2015  . OSA (obstructive sleep apnea) 06/09/2015  . Vitamin D deficiency 01/06/2015  . Bilateral  knee pain 01/05/2015  . Numbness and tingling in right hand 01/05/2015  . Insomnia 12/07/2014  . Right-sided lacunar infarction ([place]) 09/15/2014  . Left hemiparesis ([place]) 09/15/2014  . Dyspnea   . Back pain 09/11/2013  . Psychoactive substance-induced organic mood disorder ([place]) 05/28/2013  . Cocaine abuse with cocaine-induced mood disorder ([place]) 05/28/2013  . Cannabis dependence with cannabis-induced anxiety disorder ([place]) 05/28/2013  . Morbid obesity with BMI of 45.0-49.9, adult ([place]) 04/25/2012  . Chest pain 04/17/2012  . Hypertension 04/17/2012  . DM (diabetes mellitus) with peripheral vascular complication ([name]) [id number]  . Hyperlipidemia 04/17/2012  . Tobacco use 04/17/2012    Electa Sniff, PT, DPT, NCS 09/02/2020, 5:48 PM  [facility] [address] [place], [place], [address] Phone: [phone]   Fax:  [phone]  Name: Milica Mohrbacher MRN: [id number] Date of Birth: 01-Jan-1971

## 2020-09-03 ENCOUNTER — Ambulatory Visit: Payer: Medicare Other | Admitting: Speech Pathology

## 2020-09-08 ENCOUNTER — Ambulatory Visit: Payer: Medicare Other | Admitting: Occupational Therapy

## 2020-09-08 ENCOUNTER — Emergency Department (HOSPITAL_COMMUNITY)
Admission: EM | Admit: 2020-09-08 | Discharge: 2020-09-09 | Disposition: A | Discharge status: Home / Self Care | Payer: Medicare Other | Attending: Emergency Medicine | Admitting: Emergency Medicine

## 2020-09-08 ENCOUNTER — Ambulatory Visit: Payer: Medicare Other | Admitting: Physical Therapy

## 2020-09-08 ENCOUNTER — Other Ambulatory Visit: Payer: Self-pay

## 2020-09-08 DIAGNOSIS — Z794 Long term (current) use of insulin: Secondary | ICD-10-CM | POA: Diagnosis not present

## 2020-09-08 DIAGNOSIS — E1151 Type 2 diabetes mellitus with diabetic peripheral angiopathy without gangrene: Secondary | ICD-10-CM | POA: Insufficient documentation

## 2020-09-08 DIAGNOSIS — Z20822 Contact with and (suspected) exposure to covid-19: Secondary | ICD-10-CM | POA: Diagnosis not present

## 2020-09-08 DIAGNOSIS — E785 Hyperlipidemia, unspecified: Secondary | ICD-10-CM | POA: Insufficient documentation

## 2020-09-08 DIAGNOSIS — R5383 Other fatigue: Secondary | ICD-10-CM | POA: Insufficient documentation

## 2020-09-08 DIAGNOSIS — I129 Hypertensive chronic kidney disease with stage 1 through stage 4 chronic kidney disease, or unspecified chronic kidney disease: Secondary | ICD-10-CM | POA: Insufficient documentation

## 2020-09-08 DIAGNOSIS — R55 Syncope and collapse: Secondary | ICD-10-CM | POA: Diagnosis not present

## 2020-09-08 DIAGNOSIS — J449 Chronic obstructive pulmonary disease, unspecified: Secondary | ICD-10-CM | POA: Diagnosis not present

## 2020-09-08 DIAGNOSIS — Z87891 Personal history of nicotine dependence: Secondary | ICD-10-CM | POA: Diagnosis not present

## 2020-09-08 DIAGNOSIS — Z79899 Other long term (current) drug therapy: Secondary | ICD-10-CM | POA: Insufficient documentation

## 2020-09-08 DIAGNOSIS — Z7982 Long term (current) use of aspirin: Secondary | ICD-10-CM | POA: Diagnosis not present

## 2020-09-08 DIAGNOSIS — E1169 Type 2 diabetes mellitus with other specified complication: Secondary | ICD-10-CM | POA: Diagnosis not present

## 2020-09-08 DIAGNOSIS — E1142 Type 2 diabetes mellitus with diabetic polyneuropathy: Secondary | ICD-10-CM | POA: Diagnosis not present

## 2020-09-08 DIAGNOSIS — L97509 Non-pressure chronic ulcer of other part of unspecified foot with unspecified severity: Secondary | ICD-10-CM | POA: Insufficient documentation

## 2020-09-08 DIAGNOSIS — R531 Weakness: Secondary | ICD-10-CM | POA: Insufficient documentation

## 2020-09-08 DIAGNOSIS — E11621 Type 2 diabetes mellitus with foot ulcer: Secondary | ICD-10-CM | POA: Insufficient documentation

## 2020-09-08 DIAGNOSIS — R0789 Other chest pain: Secondary | ICD-10-CM | POA: Insufficient documentation

## 2020-09-08 DIAGNOSIS — N1832 Chronic kidney disease, stage 3b: Secondary | ICD-10-CM | POA: Insufficient documentation

## 2020-09-08 DIAGNOSIS — I69354 Hemiplegia and hemiparesis following cerebral infarction affecting left non-dominant side: Secondary | ICD-10-CM

## 2020-09-08 LAB — BASIC METABOLIC PANEL
Anion gap: 10 (ref 5–15)
BUN: 14 mg/dL (ref 6–20)
CO2: 23 mmol/L (ref 22–32)
Calcium: 9 mg/dL (ref 8.9–10.3)
Chloride: 104 mmol/L (ref 98–111)
Creatinine, Ser: 1.48 mg/dL — ABNORMAL HIGH (ref 0.44–1.00)
GFR, Estimated: 43 mL/min — ABNORMAL LOW (ref >60)
Glucose, Bld: 149 mg/dL — ABNORMAL HIGH (ref 70–99)
Potassium: 3.6 mmol/L (ref 3.5–5.1)
Sodium: 137 mmol/L (ref 135–145)

## 2020-09-08 LAB — CBC
HCT: 34.6 % — ABNORMAL LOW (ref 36.0–46.0)
Hemoglobin: 11.1 g/dL — ABNORMAL LOW (ref 12.0–15.0)
MCH: 23.5 pg — ABNORMAL LOW (ref 26.0–34.0)
MCHC: 32.1 g/dL (ref 30.0–36.0)
MCV: 73.3 fL — ABNORMAL LOW (ref 80.0–100.0)
Platelets: 258 10*3/uL (ref 150–400)
RBC: 4.72 MIL/uL (ref 3.87–5.11)
RDW: 16.9 % — ABNORMAL HIGH (ref 11.5–15.5)
WBC: 6.2 10*3/uL (ref 4.0–10.5)
nRBC: 0 % (ref 0.0–0.2)

## 2020-09-08 LAB — URINALYSIS, ROUTINE W REFLEX MICROSCOPIC
Bilirubin Urine: NEGATIVE
Glucose, UA: 50 mg/dL — AB
Hgb urine dipstick: NEGATIVE
Ketones, ur: NEGATIVE mg/dL
Leukocytes,Ua: NEGATIVE
Nitrite: NEGATIVE
Protein, ur: 30 mg/dL — AB
Specific Gravity, Urine: 1.01 (ref 1.005–1.030)
pH: 7 (ref 5.0–8.0)

## 2020-09-08 LAB — TROPONIN I (HIGH SENSITIVITY)
Troponin I (High Sensitivity): 3 ng/L (ref <18)
Troponin I (High Sensitivity): 3 ng/L (ref <18)

## 2020-09-08 MED ORDER — SODIUM CHLORIDE 0.9 % IV BOLUS
1000.0000 mL | Freq: Once | INTRAVENOUS | Status: AC
  Administered 2020-09-08: 1000 mL via INTRAVENOUS

## 2020-09-08 NOTE — Discharge Instructions (Addendum)
Get help right away if you: Have a severe headache. Faint once or repeatedly. Have pain in your chest, abdomen, or back. Have a very fast or irregular heartbeat (palpitations). Have pain when you breathe. Are bleeding from your mouth or rectum, or you have black or tarry stool. Have a seizure. Are confused. Have trouble walking. Have severe weakness. Have vision problems. 

## 2020-09-08 NOTE — ED Triage Notes (Signed)
Pt from [facility], had syncopal episode during her consult s/p third stroke. Reports weakness the last few days and feels lightheaded now. A/O x 4. L sided deficits and slurred speech residual from stroke.

## 2020-09-08 NOTE — ED Notes (Signed)
Called [facility] for transport home.  

## 2020-09-08 NOTE — ED Provider Notes (Signed)
[facility] EMERGENCY DEPARTMENT Provider Note   CSN: [id number] Arrival date & time: 09/08/20  1555     History Chief Complaint  Patient presents with  . Loss of Consciousness    Diamond Rice is a 50 y.o. female.  The history is provided by the patient and medical records. No language interpreter was used.  Loss of Consciousness Episode history:  Single Most recent episode:  Today Duration:  1 minute Progression:  Improving Chronicity:  New Context: sitting down   Context: not blood draw, not bowel movement, not dehydration, not exertion, not inactivity, not medication change, not with normal activity, not sight of blood, not standing up and not urination   Witnessed: yes   Relieved by:  Nothing Worsened by:  Nothing Ineffective treatments:  None tried Associated symptoms: chest pain, malaise/fatigue and weakness   Associated symptoms: no anxiety, no confusion, no diaphoresis, no difficulty breathing, no dizziness, no fever, no focal sensory loss, no focal weakness, no headaches, no nausea, no palpitations, no recent fall, no recent injury, no recent surgery, no rectal bleeding, no seizures, no shortness of breath, no visual change and no vomiting     patient w/witnessed syncopal event at Neuro rehab today. She was sitting in a chair. She reports prodrome of lightheadedness, feeling hot and getting clammy. She denies palpitations, racing or skipping in her heart. While she was waiting in the lobby she states that she had a very short period of chest tightness and pain which resolved lasting only a few seconds. She has a past medical history of previous CVA, type 2 diabetes, smoking, polysubstance abuse. She has been feeling somewhat fatigued over the past few days. She denies cough, fevers, chills, shortness of breath     Past Medical History:  Diagnosis Date  . Anxiety   . Chronic lower back pain 04/17/2012   "just got over some; catched when I walked"   . COPD (chronic obstructive pulmonary disease) (HCC)   . Critical lower limb ischemia (HCC) 05/27/2018  . Depression   . Headache(784.0) 04/17/2012   "~ qod; lately waking up in am w/one"  . High cholesterol   . Hypertension   . Migraines 04/17/2012  . Obesity   . Sleep apnea    CPAP  . Stroke (HCC)   . Type II diabetes mellitus (HCC) 08/28/2002    Patient Active Problem List   Diagnosis Date Noted  . Acute CVA (cerebrovascular accident) (HCC) 12/06/2019  . AKI (acute kidney injury) (HCC) 12/06/2019  . LVH (left ventricular hypertrophy) 08/10/2019  . Edema of lower extremity 11/26/2018  . Loss of appetite 11/26/2018  . Urinary incontinence 11/26/2018  . Allergic rhinitis 11/14/2018  . Deep venous thrombosis (HCC) 11/14/2018  . Nicotine dependence 11/14/2018  . Hemorrhagic stroke (HCC)   . Diastolic dysfunction   . Type 2 diabetes mellitus (HCC)   . Anemia of chronic disease   . Chronic obstructive pulmonary disease (HCC)   . ICH (intracerebral hemorrhage) (HCC) 08/30/2018  . Hypertensive heart disease without heart failure 07/04/2018  . Nonrheumatic aortic valve insufficiency 07/04/2018  . Esophageal abnormality   . Class 2 severe obesity due to excess calories with serious comorbidity and body mass index (BMI) of 35.0 to 35.9 in adult [facility])   . Peripheral vascular disease (HCC)   . History of osteomyelitis   . History of amputation of lesser toe (HCC)   . Pulmonary embolism (HCC) 06/22/2018  . Microcytic anemia 06/17/2018  . At risk  for adverse drug reaction 06/11/2018  . Critical lower limb ischemia ([place]) 05/27/2018  . GI bleeding 04/02/2018  . Acute renal failure (ARF) ([place]) 04/02/2018  . Hypokalemia 04/02/2018  . Polysubstance abuse ([place]) 04/02/2018  . Diabetic foot ulcer ([place]) 04/02/2018  . Tinea pedis 03/07/2018  . Diabetic peripheral neuropathy ([place]) 01/24/2018  . Cannabis use disorder, moderate, dependence ([place]) 05/19/2017  . Cocaine use disorder, moderate,  dependence ([place]) 05/19/2017  . CKD (chronic kidney disease) stage 3, GFR 30-59 ml/min (HCC) 02/17/2017  . Noncompliance with medications 02/17/2017  . Suicide attempt ([place]) 11/06/2016  . Mild cognitive impairment 03/21/2016  . Moderate episode of recurrent major depressive disorder ([place]) 03/21/2016  . B12 deficiency 03/09/2016  . Iron deficiency anemia 03/07/2016  . History of CVA with residual deficit 12/30/2015  . Schizoaffective disorder, bipolar type ([place]) 10/07/2015  . OSA (obstructive sleep apnea) 06/09/2015  . Vitamin D deficiency 01/06/2015  . Bilateral knee pain 01/05/2015  . Numbness and tingling in right hand 01/05/2015  . Insomnia 12/07/2014  . Right-sided lacunar infarction ([place]) 09/15/2014  . Left hemiparesis ([place]) 09/15/2014  . Dyspnea   . Back pain 09/11/2013  . Psychoactive substance-induced organic mood disorder ([place]) 05/28/2013  . Cocaine abuse with cocaine-induced mood disorder ([place]) 05/28/2013  . Cannabis dependence with cannabis-induced anxiety disorder ([place]) 05/28/2013  . Morbid obesity with BMI of 45.0-49.9, adult ([place]) 04/25/2012  . Chest pain 04/17/2012  . Hypertension 04/17/2012  . DM (diabetes mellitus) with peripheral vascular complication ([place]) 75/64/3329  . Hyperlipidemia 04/17/2012  . Tobacco use 04/17/2012    Past Surgical History:  Procedure Laterality Date  . ABDOMINAL AORTOGRAM W/LOWER EXTREMITY Left 05/27/2018   Procedure: ABDOMINAL AORTOGRAM W/LOWER EXTREMITY Runoff and Possible Intervention;  Surgeon: Waynetta Sandy, MD;  Location: [place] CV LAB;  Service: Cardiovascular;  Laterality: Left;  . APPLICATION OF WOUND VAC Left 06/06/2018   Procedure: APPLICATION OF WOUND VAC;  Surgeon: Waynetta Sandy, MD;  Location: [place];  Service: Vascular;  Laterality: Left;  . BIOPSY  04/04/2018   Procedure: BIOPSY;  Surgeon: [name], MD;  Location: [facility] ENDOSCOPY;  Service: Endoscopy;;  . CARDIAC CATHETERIZATION  ~ 2011  .  COLONOSCOPY N/A 04/04/2018   Procedure: COLONOSCOPY;  Surgeon: [name], MD;  Location: [facility] ENDOSCOPY;  Service: Endoscopy;  Laterality: N/A;  . LOWER EXTREMITY ANGIOGRAM Left 05/27/2018  . PERIPHERAL VASCULAR INTERVENTION Left 05/27/2018   Procedure: PERIPHERAL VASCULAR INTERVENTION;  Surgeon: Waynetta Sandy, MD;  Location: [place] CV LAB;  Service: Cardiovascular;  Laterality: Left;  . POLYPECTOMY  04/04/2018   Procedure: POLYPECTOMY;  Surgeon: [name], MD;  Location: [facility] ENDOSCOPY;  Service: Endoscopy;;  . TRANSMETATARSAL AMPUTATION Left 05/28/2018   Procedure: AMPUTATION TOES THREE, FOUR AND FIVE on left foot;  Surgeon: Waynetta Sandy, MD;  Location: [place];  Service: Vascular;  Laterality: Left;  . WOUND DEBRIDEMENT Left 06/06/2018   Procedure: DEBRIDEMENT WOUND LEFT FOOT;  Surgeon: Waynetta Sandy, MD;  Location: [place];  Service: Vascular;  Laterality: Left;     OB History   No obstetric history on file.     Family History  Problem Relation Age of Onset  . Stroke Brother 33  . Stroke Brother 74  . Heart attack Mother 58  . Stroke Father 110    Social History   Tobacco Use  . Smoking status: Former Smoker    Packs/day: 0.25    Years: 28.00    Pack years: 7.00  Types: Cigarettes  . Smokeless tobacco: Never Used  Vaping Use  . Vaping Use: Never used  Substance Use Topics  . Alcohol use: No    Alcohol/week: 0.0 standard drinks    Comment: rare  . Drug use: Yes    Types: Marijuana, "Crack" cocaine    Comment: marijuana last use days ago; last cocaine several months ago    Home Medications Prior to Admission medications   Medication Sig Start Date End Date Taking? Authorizing Provider  ACCU-CHEK GUIDE test strip 2 (two) times daily. as directed 02/06/20   [provider]  Accu-Chek Softclix Lancets lancets 2 (two) times daily. 02/06/20   [provider]  amLODipine (NORVASC) 10 MG tablet Take 1 tablet (10 mg  total) by mouth daily. 01/22/20   Royal Hawthorn, NP  ARIPiprazole (ABILIFY) 15 MG tablet Take 1 tablet (15 mg total) by mouth at bedtime. 01/22/20   Royal Hawthorn, NP  aspirin EC 81 MG EC tablet Take 1 tablet (81 mg total) by mouth daily. 09/21/18   Donzetta Starch, NP  atorvastatin (LIPITOR) 40 MG tablet Take 1 tablet (40 mg total) by mouth daily at 6 PM. 01/22/20   Royal Hawthorn, NP  bisacodyl (DULCOLAX) 10 MG suppository Place 10 mg rectally daily as needed for mild constipation or moderate constipation (if no relief from Milk of Magnesia).    [provider]  Blood Glucose Monitoring Suppl (ACCU-CHEK GUIDE) w/Device KIT USE AS DIRECTED TO CHECK BLOOD SUGAR 02/06/20   [provider]  cilostazol (PLETAL) 100 MG tablet Take 1 tablet (100 mg total) by mouth 2 (two) times daily. 01/22/20   Royal Hawthorn, NP  cloNIDine (CATAPRES) 0.3 MG tablet Take 1 tablet (0.3 mg total) by mouth 2 (two) times daily. 01/22/20   Royal Hawthorn, NP  escitalopram (LEXAPRO) 10 MG tablet  04/29/20   [provider]  ferrous sulfate 325 (65 FE) MG tablet Take 1 tablet (325 mg total) by mouth daily with breakfast. 08/26/18   Medina-Vargas, Monina C, NP  hydrALAZINE (APRESOLINE) 100 MG tablet Take 1 tablet (100 mg total) by mouth 3 (three) times daily. 01/22/20   Royal Hawthorn, NP  HYDROcodone-acetaminophen (NORCO/VICODIN) 5-325 MG tablet Take 1 tablet by mouth every 6 (six) hours as needed for severe pain. 02/18/20   Alfredia Client, PA-C  insulin detemir (LEVEMIR) 100 UNIT/ML FlexPen Inject 15 Units into the skin 2 (two) times daily. 01/22/20   Royal Hawthorn, NP  iron polysaccharides (NU-IRON) 150 MG capsule Nu-Iron 150 mg iron capsule  Take 1 capsule twice a day by oral route with meals.    [provider]  lactulose (CHRONULAC) 10 GM/15ML solution Take 30 g by mouth 2 (two) times daily. For Constipation    [provider]  lidocaine (LIDODERM) 5 % Place 1 patch onto the skin  daily. Remove & Discard patch within 12 hours or as directed by MD 02/18/20   Alfredia Client, PA-C  lisinopril (ZESTRIL) 40 MG tablet Take 1 tablet (40 mg total) by mouth daily. 01/22/20   Royal Hawthorn, NP  magnesium hydroxide (MILK OF MAGNESIA) 400 MG/5ML suspension Take 30 mLs by mouth daily as needed for mild constipation.    [provider]  methylPREDNISolone (MEDROL DOSEPAK) 4 MG TBPK tablet Per dose pack instructions 02/18/20   Alfredia Client, PA-C  metoprolol tartrate (LOPRESSOR) 25 MG tablet Take 0.5 tablets (12.5 mg total) by mouth 2 (two) times daily. 01/22/20   Royal Hawthorn, NP  mirtazapine (REMERON) 15 MG  tablet Take 1 tablet (15 mg total) by mouth at bedtime. For Depression 01/22/20   Royal Hawthorn, NP  Nutritional Supplement LIQD Take 120 mLs by mouth daily. MedPass    [provider]  Nutritional Supplements (NUTRITIONAL SUPPLEMENT PO) Take 1 each by mouth daily. Social research officer, government, Historical, MD  rosuvastatin (CRESTOR) 40 MG tablet rosuvastatin 40 mg tablet    [provider]  senna-docusate (SENEXON-S) 8.6-50 MG tablet Take 2 tablets by mouth 2 (two) times daily. For Constipation 12/30/19   [provider]    Allergies    Morphine and related and Metformin  Review of Systems   Review of Systems  Constitutional: Positive for malaise/fatigue. Negative for diaphoresis and fever.  Respiratory: Negative for shortness of breath.   Cardiovascular: Positive for chest pain and syncope. Negative for palpitations.  Gastrointestinal: Negative for nausea and vomiting.  Neurological: Positive for weakness. Negative for dizziness, focal weakness, seizures and headaches.  Psychiatric/Behavioral: Negative for confusion.    Physical Exam Updated Vital Signs BP 117/82   Pulse 66   Temp 97.7 F (36.5 C)   Resp 18   LMP 04/26/2018 (Approximate)   SpO2 100%   Physical Exam  ED Results / Procedures / Treatments   Labs (all labs ordered are  listed, but only abnormal results are displayed) Labs Reviewed  BASIC METABOLIC PANEL - Abnormal; Notable for the following components:      Result Value   Glucose, Bld 149 (*)    Creatinine, Ser 1.48 (*)    GFR, Estimated 43 (*)    All other components within normal limits  CBC - Abnormal; Notable for the following components:   Hemoglobin 11.1 (*)    HCT 34.6 (*)    MCV 73.3 (*)    MCH 23.5 (*)    RDW 16.9 (*)    All other components within normal limits  URINALYSIS, ROUTINE W REFLEX MICROSCOPIC  CBG MONITORING, ED  TROPONIN I (HIGH SENSITIVITY)    EKG EKG Interpretation  Date/Time:  Wednesday September 08 2020 16:05:17 EST Ventricular Rate:  58 PR Interval:  218 QRS Duration: 98 QT Interval:  486 QTC Calculation: 477 R Axis:   -19 Text Interpretation: Sinus bradycardia with 1st degree A-V block Moderate voltage criteria for LVH, may be normal variant ( R in aVL , Cornell product ) Anterolateral infarct , age undetermined Abnormal ECG Confirmed by Dewaine Conger [phone]) on 09/08/2020 4:14:09 PM   Radiology No results found.  Procedures Procedures (including critical care time)  Medications Ordered in ED Medications  sodium chloride 0.9 % bolus 1,000 mL (has no administration in time range)    ED Course  I have reviewed the triage vital signs and the nursing notes.  Pertinent labs & imaging results that were available during my care of the patient were reviewed by me and considered in my medical decision making (see chart for details).    MDM Rules/Calculators/A&P                           UK:GURKYHC VS: BP (!) 167/105   Pulse 64   Temp 98.4 F (36.9 C)   Resp 16   LMP 04/26/2018 (Approximate)   SpO2 100%   WC:BJSEGBT is gathered by patient and emr. Previous records obtained and reviewed. DDX:The patient's complaint of syncope involves an extensive number of diagnostic and treatment options, and is a complaint that carries with it a high risk  of  complications, morbidity, and potential mortality. Given the large differential diagnosis, medical decision making is of high complexity. The differential for syncope is extensive and includes, but is not limited to: arrythmia (Vtach, SVT, SSS, sinus arrest, AV block, bradycardia) aortic stenosis, AMI, HOCM, PE, atrial myxoma, pulmonary hypertension, orthostatic hypotension, (hypovolemia, drug effect, GB syndrome, micturition, cough, swall) carotid sinus sensitivity, Seizure, TIA/CVA, hypoglycemia,  Vertigo.  Labs: I ordered reviewed and interpreted labs which include CBC which shows labs at baseline with microcytic anemia of 11.1, BMP with creatinine just above baseline for the patient just have some mild dehydration.  Troponin is negative x2.  Urinalysis is within normal limits shows no evidence of infection.  COVID test is pending. Imaging:  EKG: Sinus bradycardia at a rate of 58 with first-degree AV block  Consults: MDM: Patient here with syncopal event with prodrome. No evidence of cardiac arrhythmia on EKG or cardiac monitoring during her examination.  EKG is concerning for significant or serious arrhythmia.  Patient has had no repeat syncopal events.  She was given some fluid for mild dehydration.  Case discussed with Dr. Vanita Panda who feels patient may be followed up with her cardiologist and very close outpatient follow-up.  Patient appears otherwise appropriate for discharge at this time. Patient disposition:The patient appears reasonably screened and/or stabilized for discharge and I doubt any other medical condition or other [facility] requiring further screening, evaluation, or treatment in the ED at this time prior to discharge. I have discussed lab and/or imaging findings with the patient and answered all questions/concerns to the best of my ability.I have discussed return precautions and OP follow up.   Diamond Rice was evaluated in Emergency Department on 09/08/2020 for the symptoms described  in the history of present illness. She was evaluated in the context of the global COVID-19 pandemic, which necessitated consideration that the patient might be at risk for infection with the SARS-CoV-2 virus that causes COVID-19. Institutional protocols and algorithms that pertain to the evaluation of patients at risk for COVID-19 are in a state of rapid change based on information released by regulatory bodies including the CDC and federal and state organizations. These policies and algorithms were followed during the patient's care in the ED.  Final Clinical Impression(s) / ED Diagnoses Final diagnoses:  None    Rx / DC Orders ED Discharge Orders    None       Margarita Mail, PA-C 09/08/20 2345    Carmin Muskrat, MD 09/09/20 2338

## 2020-09-08 NOTE — Therapy (Signed)
[facility] [address] [place], [place], [address] Phone: [phone]   Fax:  [phone]  Patient Details  Name: Diamond Rice MRN: [id number] Date of Birth: 1971/07/27 Referring Provider:  [name], MD  Encounter Date: 09/08/2020  Pt arrived for O.T. evaluation at 2 pm. Pt walked back to gym from waiting room with quad cane and close supervision due to slightly unsteady and occasional LOB. Pt appeared lethargic but was able to answer questions and follow commands. Approx 1/4 through the evaluation, pt w/ increased drowsiness and unable to respond to questions, became hot and clammy pulling at coat and beginning to slide out of chair. Another therapist assisted primary therapist getting patient into wheelchair and transporting to private room. Pt was then transferred to mat, EMS was notified, BP was taken (132/106), HR (63). Pt was closely monitored until EMS arrived. Pt was then transferred to stretcher and transported by EMS to ED.   No charge for this visit  Carey Bullocks, OTR/L 09/08/2020, 3:06 PM  [facility] [address] [place], [place], [address] Phone: [phone]   Fax:  [phone]

## 2020-09-09 LAB — SARS CORONAVIRUS 2 (TAT 6-24 HRS): SARS Coronavirus 2: NEGATIVE

## 2020-09-09 NOTE — ED Notes (Signed)
Pt called family friend and is aware pt will be arriving home soon to open the door.

## 2020-09-09 NOTE — ED Notes (Signed)
Ptar is at bedside.Alll paperwork given to ptar.

## 2020-09-10 ENCOUNTER — Ambulatory Visit: Payer: Medicare Other | Admitting: Physical Therapy

## 2020-09-10 ENCOUNTER — Other Ambulatory Visit: Payer: Self-pay

## 2020-09-10 VITALS — BP 191/97 | HR 68

## 2020-09-10 DIAGNOSIS — I69354 Hemiplegia and hemiparesis following cerebral infarction affecting left non-dominant side: Secondary | ICD-10-CM

## 2020-09-10 NOTE — Therapy (Signed)
[facility] [address] [place], [place], [address] Phone: [phone]   Fax:  [phone]  Physical Therapy Treatment- Arrived No Charge  Patient Details  Name: Diamond Rice MRN: [id number] Date of Birth: 08/04/1971 Referring Provider (PT): Johney Maine, MD   Encounter Date: 09/10/2020   PT End of Session - 09/10/20 1528    Visit Number 5   arrived no charge   Number of Visits 17    Date for PT Re-Evaluation 11/10/20   written for 60 day POC   Authorization Type Medicare & Medicaid    Activity Tolerance --   limited by high BP          Past Medical History:  Diagnosis Date  . Anxiety   . Chronic lower back pain 04/17/2012   "just got over some; catched when I walked"  . COPD (chronic obstructive pulmonary disease) ([place])   . Critical lower limb ischemia ([place]) 05/27/2018  . Depression   . Headache(784.0) 04/17/2012   "~ qod; lately waking up in am w/one"  . High cholesterol   . Hypertension   . Migraines 04/17/2012  . Obesity   . Sleep apnea    CPAP  . Stroke ([place])   . Type II diabetes mellitus ([place]) 08/28/2002    Past Surgical History:  Procedure Laterality Date  . ABDOMINAL AORTOGRAM W/LOWER EXTREMITY Left 05/27/2018   Procedure: ABDOMINAL AORTOGRAM W/LOWER EXTREMITY Runoff and Possible Intervention;  Surgeon: Waynetta Sandy, MD;  Location: [facility];  Service: Cardiovascular;  Laterality: Left;  . APPLICATION OF WOUND VAC Left 06/06/2018   Procedure: APPLICATION OF WOUND VAC;  Surgeon: Waynetta Sandy, MD;  Location: [facility];  Service: Vascular;  Laterality: Left;  . BIOPSY  04/04/2018   Procedure: BIOPSY;  Surgeon: [name], MD;  Location: [facility] ENDOSCOPY;  Service: Endoscopy;;  . CARDIAC CATHETERIZATION  ~ 2011  . COLONOSCOPY N/A 04/04/2018   Procedure: COLONOSCOPY;  Surgeon: [name], MD;  Location: [facility] ENDOSCOPY;  Service: Endoscopy;  Laterality: N/A;  . LOWER  EXTREMITY ANGIOGRAM Left 05/27/2018  . PERIPHERAL VASCULAR INTERVENTION Left 05/27/2018   Procedure: PERIPHERAL VASCULAR INTERVENTION;  Surgeon: Waynetta Sandy, MD;  Location: [facility];  Service: Cardiovascular;  Laterality: Left;  . POLYPECTOMY  04/04/2018   Procedure: POLYPECTOMY;  Surgeon: [name], MD;  Location: [facility] END[facility]COPY;  Service: Endoscopy;;  . TRANSMETATARSAL AMPUTATION Left 05/28/2018   Procedure: AMPUTATION TOES THREE, FOUR AND FIVE on left foot;  Surgeon: Waynetta Sandy, MD;  Location: [facility];  Service: Vascular;  Laterality: Left;  . WOUND DEBRIDEMENT Left 06/06/2018   Procedure: DEBRIDEMENT WOUND LEFT FOOT;  Surgeon: Waynetta Sandy, MD;  Location: [facility];  Service: Vascular;  Laterality: Left;    Vitals:   09/10/20 1444 09/10/20 1458 09/10/20 1518  BP: (!) 166/106 (!) 173/107 (!) 191/97  Pulse: 72 66 68        Pt arrives to PT session out in waiting room - states that she went to go try to pick up some of her BP medication from [facility] and was unable to pick it up (states she ran out of her 90 day supply and was due for a refill and had no more left). Assessed pt's BP at 166/106 with pt asymptomatic. Called pt's PCP office (Dr. Maia Petties) and spoke to nurse and relayed information to staff about pt's BP and pt being unable to pick up medication. Nurse stating she was given  a 90 day supply of lisinopril and amlodipine on August 02, 2020 and she should still have some. Pt saw Dr. Maia Petties this morning and he wrote her a refill for her hydralazine that pt still needs to go to pick up from the pharmacy. Nurse's recommendation is not to send pt to the ER as she will have another pill filled and incr BP is likely due to stress regarding medication. Pt was due for a clonidine and took one while present with PT. BP monitored throughout - remaining elevated but pt reporting no signs/sx of a CVA. Discussed with pt importance of following up with her  cardiologist as instructions from recent ER visit due to syncope. Therapist providing pt with contact information with cardiologist's office as pt was unaware of this information. Reviewed the signs/sx of a CVA with pt and for pt to call 911 immediately if she has any symptoms. Discussed with pt the possibility of going on hold from therapy until after she sees her cardiologist due to elevated BP and recent syncopal episode. Pt in agreement. PT to cancel pt's next 2 appointments next week.  Pt left in waiting room waiting for transportation to take her back home.                            PT Short Term Goals - 08/17/20 1707      PT SHORT TERM GOAL #1   Title Patient will be independent with initial HEP in order to build upon functional gains in therapy. ALL STGS DUE 09/09/20    Time 4    Period Weeks    Status New    Target Date 09/09/20      PT SHORT TERM GOAL #2   Title Pt will improve gait speed to at least .9 ft/sec in order to demo improved gait efficiency.    Baseline .71 ft/sec in 20 ft on 08/17/20 with [facility]    Time 4    Period Weeks    Status Revised      PT SHORT TERM GOAL #3   Title Pt will undergo further assessment of L foot up brace vs. L AFO in order to demo improved functional mobility.    Time 4    Period Weeks    Status New      PT SHORT TERM GOAL #4   Title Patient will ambulate at least 43' with large base quad base cane and supervision in order to improve functional ambulation at home.    Time 4    Period Weeks    Status New      PT SHORT TERM GOAL #5   Title Pt will decr TUG time with large base quad cane to 33 seconds or less in order to demo decr fall risk.    Baseline 37.16 with large base quad cane    Time 4    Period Weeks    Status New             PT Long Term Goals - 08/17/20 1707      PT LONG TERM GOAL #1   Title Patient will be independent with final HEP in order to build upon functional gains in therapy. ALL LTGS DUE  10/07/20    Time 8    Period Weeks    Status New      PT LONG TERM GOAL #2   Title Pt will improve gait speed to at least 1.2  ft/sec in order to demo improved gait efficiency.    Baseline .71 ft/sec with [facility]    Time 8    Period Weeks    Status Revised      PT LONG TERM GOAL #3   Title Pt will decr TUG time with large base quad cane to 27 seconds or less in order to demo decr fall risk.    Baseline 37.16 with large base quad cane    Time 8    Period Weeks    Status New      PT LONG TERM GOAL #4   Title Patient will ambulate at least 115' indoors and outdoors with large base quad base cane and supervision  in order to improve tolerance for walking in and out of doctors appointments    Time 8    Period Weeks    Status New      PT LONG TERM GOAL #5   Title Patient will decrease 5x sit <> stand time to at least 22 seconds with single UE assist from chair in order to demo improved LE strength.    Baseline 28.38 seconds from chair with RUE support from chair    Time 8    Period Weeks    Status New                  Patient will benefit from skilled therapeutic intervention in order to improve the following deficits and impairments:     Visit Diagnosis: Hemiplegia and hemiparesis following cerebral infarction affecting left non-dominant side [facility])     Problem List Patient Active Problem List   Diagnosis Date Noted  . Acute CVA (cerebrovascular accident) ([place]) 12/06/2019  . AKI (acute kidney injury) ([place]) 12/06/2019  . LVH (left ventricular hypertrophy) 08/10/2019  . Edema of lower extremity 11/26/2018  . Loss of appetite 11/26/2018  . Urinary incontinence 11/26/2018  . Allergic rhinitis 11/14/2018  . Deep venous thrombosis ([place]) 11/14/2018  . Nicotine dependence 11/14/2018  . Hemorrhagic stroke ([place])   . Diastolic dysfunction   . Type 2 diabetes mellitus ([place])   . Anemia of chronic disease   . Chronic obstructive pulmonary disease ([place])   . ICH  (intracerebral hemorrhage) ([place]) 08/30/2018  . Hypertensive heart disease without heart failure 07/04/2018  . Nonrheumatic aortic valve insufficiency 07/04/2018  . Esophageal abnormality   . Class 2 severe obesity due to excess calories with serious comorbidity and body mass index (BMI) of 35.0 to 35.9 in adult [facility])   . Peripheral vascular disease ([place])   . History of osteomyelitis   . History of amputation of lesser toe ([place])   . Pulmonary embolism ([place]) 06/22/2018  . Microcytic anemia 06/17/2018  . At risk for adverse drug reaction 06/11/2018  . Critical lower limb ischemia ([place]) 05/27/2018  . GI bleeding 04/02/2018  . Acute renal failure (ARF) ([place]) 04/02/2018  . Hypokalemia 04/02/2018  . Polysubstance abuse ([place]) 04/02/2018  . Diabetic foot ulcer ([place]) 04/02/2018  . Tinea pedis 03/07/2018  . Diabetic peripheral neuropathy ([place]) 01/24/2018  . Cannabis use disorder, moderate, dependence ([place]) 05/19/2017  . Cocaine use disorder, moderate, dependence ([place]) 05/19/2017  . CKD (chronic kidney disease) stage 3, GFR 30-59 ml/min (H[facility]) 02/17/2017  . Noncompliance with medications 02/17/2017  . Suicide attempt ([place]) 11/06/2016  . Mild cognitive impairment 03/21/2016  . Moderate episode of recurrent major depressive disorder ([place]) 03/21/2016  . B12 deficiency 03/09/2016  . Iron deficiency anemia 03/07/2016  . History of CVA with residual  deficit 12/30/2015  . Schizoaffective disorder, bipolar type ([place]) 10/07/2015  . OSA (obstructive sleep apnea) 06/09/2015  . Vitamin D deficiency 01/06/2015  . Bilateral knee pain 01/05/2015  . Numbness and tingling in right hand 01/05/2015  . Insomnia 12/07/2014  . Right-sided lacunar infarction ([place]) 09/15/2014  . Left hemiparesis ([place]) 09/15/2014  . Dyspnea   . Back pain 09/11/2013  . Psychoactive substance-induced organic mood disorder ([place]) 05/28/2013  . Cocaine abuse with cocaine-induced mood disorder ([place]) 05/28/2013  . Cannabis dependence  with cannabis-induced anxiety disorder ([place]) 05/28/2013  . Morbid obesity with BMI of 45.0-49.9, adult ([place]) 04/25/2012  . Chest pain 04/17/2012  . Hypertension 04/17/2012  . DM (diabetes mellitus) with peripheral vascular complication ([place]) 64/15/8309  . Hyperlipidemia 04/17/2012  . Tobacco use 04/17/2012    Arliss Journey, PT, DPT  09/10/2020, 3:29 PM  [facility] [address] [place], [place], [address] Phone: [phone]   Fax:  [phone]  Name: Diamond Rice MRN: [id number] Date of Birth: September 13, 1970

## 2020-09-14 ENCOUNTER — Ambulatory Visit: Payer: Medicare Other | Admitting: Physical Therapy

## 2020-09-16 ENCOUNTER — Ambulatory Visit: Payer: Medicare Other

## 2020-09-20 ENCOUNTER — Other Ambulatory Visit: Payer: Self-pay | Admitting: Gastroenterology

## 2020-09-21 ENCOUNTER — Ambulatory Visit: Payer: Medicare Other | Admitting: Physical Therapy

## 2020-09-23 ENCOUNTER — Ambulatory Visit: Payer: Medicare Other

## 2020-09-24 ENCOUNTER — Ambulatory Visit: Payer: Medicare Other | Admitting: Medical

## 2020-09-28 ENCOUNTER — Ambulatory Visit: Payer: Medicare Other | Admitting: Physical Therapy

## 2020-09-30 ENCOUNTER — Ambulatory Visit: Payer: Medicare Other

## 2020-10-05 ENCOUNTER — Ambulatory Visit: Payer: Medicare Other | Admitting: Physical Therapy

## 2020-10-07 ENCOUNTER — Ambulatory Visit: Payer: Medicare Other

## 2020-10-20 ENCOUNTER — Other Ambulatory Visit: Payer: Medicare Other

## 2020-10-20 ENCOUNTER — Inpatient Hospital Stay: Admission: RE | Admit: 2020-10-20 | Payer: Medicare Other | Source: Ambulatory Visit

## 2020-10-21 NOTE — Progress Notes (Signed)
Attempted to obtain medical history via telephone, unable to reach at this time. I left a voicemail to return pre surgical testing department's phone call.  

## 2020-10-25 ENCOUNTER — Other Ambulatory Visit (HOSPITAL_COMMUNITY)
Admission: RE | Admit: 2020-10-25 | Discharge: 2020-10-25 | Disposition: A | Discharge status: Home / Self Care | Payer: Medicare Other | Source: Ambulatory Visit | Attending: Gastroenterology | Admitting: Gastroenterology

## 2020-10-25 DIAGNOSIS — Z20822 Contact with and (suspected) exposure to covid-19: Secondary | ICD-10-CM | POA: Diagnosis not present

## 2020-10-25 DIAGNOSIS — Z01812 Encounter for preprocedural laboratory examination: Secondary | ICD-10-CM | POA: Diagnosis present

## 2020-10-26 LAB — SARS CORONAVIRUS 2 (TAT 6-24 HRS): SARS Coronavirus 2: NEGATIVE

## 2020-10-27 ENCOUNTER — Emergency Department (HOSPITAL_COMMUNITY): Payer: Medicare Other

## 2020-10-27 ENCOUNTER — Encounter (HOSPITAL_COMMUNITY): Payer: Self-pay | Admitting: Gastroenterology

## 2020-10-27 ENCOUNTER — Other Ambulatory Visit: Payer: Self-pay

## 2020-10-27 ENCOUNTER — Ambulatory Visit (HOSPITAL_COMMUNITY)
Admission: RE | Admit: 2020-10-27 | Discharge: 2020-10-27 | Disposition: A | Discharge status: Hospice | Payer: Medicare Other | Attending: Gastroenterology | Admitting: Gastroenterology

## 2020-10-27 ENCOUNTER — Ambulatory Visit (HOSPITAL_COMMUNITY): Payer: Medicare Other | Admitting: Certified Registered Nurse Anesthetist

## 2020-10-27 ENCOUNTER — Encounter (HOSPITAL_COMMUNITY): Admission: RE | Disposition: A | Discharge status: Hospice | Payer: Self-pay | Source: Home / Self Care

## 2020-10-27 ENCOUNTER — Emergency Department (EMERGENCY_DEPARTMENT_HOSPITAL)
Admission: EM | Admit: 2020-10-27 | Discharge: 2020-10-29 | Disposition: A | Discharge status: Home / Self Care | Payer: Medicare Other | Source: Home / Self Care | Attending: Emergency Medicine | Admitting: Emergency Medicine

## 2020-10-27 ENCOUNTER — Encounter (HOSPITAL_COMMUNITY): Payer: Self-pay | Admitting: Emergency Medicine

## 2020-10-27 DIAGNOSIS — F339 Major depressive disorder, recurrent, unspecified: Secondary | ICD-10-CM | POA: Insufficient documentation

## 2020-10-27 DIAGNOSIS — Z794 Long term (current) use of insulin: Secondary | ICD-10-CM | POA: Insufficient documentation

## 2020-10-27 DIAGNOSIS — F419 Anxiety disorder, unspecified: Secondary | ICD-10-CM | POA: Insufficient documentation

## 2020-10-27 DIAGNOSIS — F1220 Cannabis dependence, uncomplicated: Secondary | ICD-10-CM | POA: Insufficient documentation

## 2020-10-27 DIAGNOSIS — I69391 Dysphagia following cerebral infarction: Secondary | ICD-10-CM | POA: Diagnosis not present

## 2020-10-27 DIAGNOSIS — F333 Major depressive disorder, recurrent, severe with psychotic symptoms: Secondary | ICD-10-CM | POA: Diagnosis present

## 2020-10-27 DIAGNOSIS — R45851 Suicidal ideations: Secondary | ICD-10-CM | POA: Insufficient documentation

## 2020-10-27 DIAGNOSIS — R079 Chest pain, unspecified: Secondary | ICD-10-CM | POA: Diagnosis not present

## 2020-10-27 DIAGNOSIS — E1140 Type 2 diabetes mellitus with diabetic neuropathy, unspecified: Secondary | ICD-10-CM | POA: Insufficient documentation

## 2020-10-27 DIAGNOSIS — K5730 Diverticulosis of large intestine without perforation or abscess without bleeding: Secondary | ICD-10-CM | POA: Insufficient documentation

## 2020-10-27 DIAGNOSIS — D509 Iron deficiency anemia, unspecified: Secondary | ICD-10-CM | POA: Insufficient documentation

## 2020-10-27 DIAGNOSIS — N183 Chronic kidney disease, stage 3 unspecified: Secondary | ICD-10-CM | POA: Insufficient documentation

## 2020-10-27 DIAGNOSIS — Z7982 Long term (current) use of aspirin: Secondary | ICD-10-CM | POA: Insufficient documentation

## 2020-10-27 DIAGNOSIS — D124 Benign neoplasm of descending colon: Secondary | ICD-10-CM | POA: Insufficient documentation

## 2020-10-27 DIAGNOSIS — Z888 Allergy status to other drugs, medicaments and biological substances status: Secondary | ICD-10-CM | POA: Insufficient documentation

## 2020-10-27 DIAGNOSIS — J449 Chronic obstructive pulmonary disease, unspecified: Secondary | ICD-10-CM | POA: Insufficient documentation

## 2020-10-27 DIAGNOSIS — Z79899 Other long term (current) drug therapy: Secondary | ICD-10-CM | POA: Insufficient documentation

## 2020-10-27 DIAGNOSIS — Z87891 Personal history of nicotine dependence: Secondary | ICD-10-CM | POA: Diagnosis not present

## 2020-10-27 DIAGNOSIS — K2950 Unspecified chronic gastritis without bleeding: Secondary | ICD-10-CM | POA: Diagnosis not present

## 2020-10-27 DIAGNOSIS — F1420 Cocaine dependence, uncomplicated: Secondary | ICD-10-CM | POA: Insufficient documentation

## 2020-10-27 DIAGNOSIS — I69354 Hemiplegia and hemiparesis following cerebral infarction affecting left non-dominant side: Secondary | ICD-10-CM | POA: Diagnosis not present

## 2020-10-27 DIAGNOSIS — R0789 Other chest pain: Secondary | ICD-10-CM | POA: Insufficient documentation

## 2020-10-27 DIAGNOSIS — F32A Depression, unspecified: Secondary | ICD-10-CM

## 2020-10-27 DIAGNOSIS — Z885 Allergy status to narcotic agent status: Secondary | ICD-10-CM | POA: Insufficient documentation

## 2020-10-27 DIAGNOSIS — Z8719 Personal history of other diseases of the digestive system: Secondary | ICD-10-CM | POA: Diagnosis not present

## 2020-10-27 DIAGNOSIS — E1122 Type 2 diabetes mellitus with diabetic chronic kidney disease: Secondary | ICD-10-CM | POA: Insufficient documentation

## 2020-10-27 DIAGNOSIS — B9681 Helicobacter pylori [H. pylori] as the cause of diseases classified elsewhere: Secondary | ICD-10-CM | POA: Diagnosis not present

## 2020-10-27 DIAGNOSIS — I129 Hypertensive chronic kidney disease with stage 1 through stage 4 chronic kidney disease, or unspecified chronic kidney disease: Secondary | ICD-10-CM | POA: Insufficient documentation

## 2020-10-27 DIAGNOSIS — Z8673 Personal history of transient ischemic attack (TIA), and cerebral infarction without residual deficits: Secondary | ICD-10-CM | POA: Insufficient documentation

## 2020-10-27 DIAGNOSIS — R1310 Dysphagia, unspecified: Secondary | ICD-10-CM | POA: Insufficient documentation

## 2020-10-27 HISTORY — PX: POLYPECTOMY: SHX5525

## 2020-10-27 HISTORY — PX: BIOPSY: SHX5522

## 2020-10-27 HISTORY — PX: COLONOSCOPY WITH PROPOFOL: SHX5780

## 2020-10-27 HISTORY — PX: ESOPHAGOGASTRODUODENOSCOPY (EGD) WITH PROPOFOL: SHX5813

## 2020-10-27 LAB — CBC
HCT: 34.9 % — ABNORMAL LOW (ref 36.0–46.0)
Hemoglobin: 10.7 g/dL — ABNORMAL LOW (ref 12.0–15.0)
MCH: 23.1 pg — ABNORMAL LOW (ref 26.0–34.0)
MCHC: 30.7 g/dL (ref 30.0–36.0)
MCV: 75.4 fL — ABNORMAL LOW (ref 80.0–100.0)
Platelets: 237 10*3/uL (ref 150–400)
RBC: 4.63 MIL/uL (ref 3.87–5.11)
RDW: 15.9 % — ABNORMAL HIGH (ref 11.5–15.5)
WBC: 4.8 10*3/uL (ref 4.0–10.5)
nRBC: 0 % (ref 0.0–0.2)

## 2020-10-27 LAB — TROPONIN I (HIGH SENSITIVITY): Troponin I (High Sensitivity): <2 ng/L (ref <18)

## 2020-10-27 LAB — GLUCOSE, CAPILLARY: Glucose-Capillary: 110 mg/dL — ABNORMAL HIGH (ref 70–99)

## 2020-10-27 LAB — HEPATIC FUNCTION PANEL
ALT: 10 U/L (ref 0–44)
AST: 19 U/L (ref 15–41)
Albumin: 3.7 g/dL (ref 3.5–5.0)
Alkaline Phosphatase: 56 U/L (ref 38–126)
Bilirubin, Direct: 0.1 mg/dL (ref 0.0–0.2)
Indirect Bilirubin: 0.5 mg/dL (ref 0.3–0.9)
Total Bilirubin: 0.6 mg/dL (ref 0.3–1.2)
Total Protein: 7.6 g/dL (ref 6.5–8.1)

## 2020-10-27 LAB — BASIC METABOLIC PANEL
Anion gap: 7 (ref 5–15)
BUN: 11 mg/dL (ref 6–20)
CO2: 28 mmol/L (ref 22–32)
Calcium: 9.1 mg/dL (ref 8.9–10.3)
Chloride: 105 mmol/L (ref 98–111)
Creatinine, Ser: 1.2 mg/dL — ABNORMAL HIGH (ref 0.44–1.00)
GFR, Estimated: 56 mL/min — ABNORMAL LOW (ref >60)
Glucose, Bld: 101 mg/dL — ABNORMAL HIGH (ref 70–99)
Potassium: 3.8 mmol/L (ref 3.5–5.1)
Sodium: 140 mmol/L (ref 135–145)

## 2020-10-27 LAB — SALICYLATE LEVEL: Salicylate Lvl: <7 mg/dL — ABNORMAL LOW (ref 7.0–30.0)

## 2020-10-27 LAB — CBG MONITORING, ED: Glucose-Capillary: 186 mg/dL — ABNORMAL HIGH (ref 70–99)

## 2020-10-27 LAB — ACETAMINOPHEN LEVEL: Acetaminophen (Tylenol), Serum: <10 ug/mL — ABNORMAL LOW (ref 10–30)

## 2020-10-27 LAB — I-STAT BETA HCG BLOOD, ED (MC, WL, AP ONLY): I-stat hCG, quantitative: <5 m[IU]/mL (ref <5)

## 2020-10-27 SURGERY — ESOPHAGOGASTRODUODENOSCOPY (EGD) WITH PROPOFOL
Anesthesia: Monitor Anesthesia Care

## 2020-10-27 MED ORDER — LIDOCAINE 2% (20 MG/ML) 5 ML SYRINGE
INTRAMUSCULAR | Status: DC | PRN
  Administered 2020-10-27: 80 mg via INTRAVENOUS

## 2020-10-27 MED ORDER — LABETALOL HCL 5 MG/ML IV SOLN
INTRAVENOUS | Status: DC | PRN
  Administered 2020-10-27 (×2): 5 mg via INTRAVENOUS

## 2020-10-27 MED ORDER — PROPOFOL 10 MG/ML IV BOLUS
INTRAVENOUS | Status: DC | PRN
  Administered 2020-10-27: 30 mg via INTRAVENOUS

## 2020-10-27 MED ORDER — SODIUM CHLORIDE 0.9 % IV SOLN: INTRAVENOUS | Status: DC

## 2020-10-27 MED ORDER — PROPOFOL 500 MG/50ML IV EMUL
INTRAVENOUS | Status: DC | PRN
  Administered 2020-10-27: 150 ug/kg/min via INTRAVENOUS

## 2020-10-27 MED ORDER — ARIPIPRAZOLE 5 MG PO TABS
5.0000 mg | ORAL_TABLET | Freq: Every day | ORAL | Status: DC
  Administered 2020-10-27 – 2020-10-29 (×3): 5 mg via ORAL
  Filled 2020-10-27 (×3): qty 1

## 2020-10-27 MED ORDER — INSULIN DETEMIR 100 UNIT/ML ~~LOC~~ SOLN
15.0000 [IU] | Freq: Two times a day (BID) | SUBCUTANEOUS | Status: DC
  Administered 2020-10-27 – 2020-10-29 (×4): 15 [IU] via SUBCUTANEOUS
  Filled 2020-10-27 (×5): qty 0.15

## 2020-10-27 MED ORDER — HYDRALAZINE HCL 25 MG PO TABS
25.0000 mg | ORAL_TABLET | Freq: Once | ORAL | Status: AC
  Administered 2020-10-27: 25 mg via ORAL
  Filled 2020-10-27: qty 1

## 2020-10-27 MED ORDER — MIRTAZAPINE 7.5 MG PO TABS
15.0000 mg | ORAL_TABLET | Freq: Every day | ORAL | Status: DC
  Administered 2020-10-28 (×2): 15 mg via ORAL
  Filled 2020-10-27 (×2): qty 2

## 2020-10-27 MED ORDER — HYDRALAZINE HCL 25 MG PO TABS
100.0000 mg | ORAL_TABLET | Freq: Three times a day (TID) | ORAL | Status: DC
  Administered 2020-10-27 – 2020-10-29 (×5): 100 mg via ORAL
  Filled 2020-10-27 (×5): qty 4

## 2020-10-27 MED ORDER — PROPOFOL 1000 MG/100ML IV EMUL
INTRAVENOUS | Status: AC
  Filled 2020-10-27: qty 100

## 2020-10-27 MED ORDER — LACTATED RINGERS IV SOLN: INTRAVENOUS | Status: DC

## 2020-10-27 SURGICAL SUPPLY — 25 items

## 2020-10-27 NOTE — ED Notes (Signed)
Patient's belongings placed in 2 bag (clothes, shoes, & phone) placed in locker 28 and cane with patient moved to room 28.  Report given to [facility] RN.

## 2020-10-27 NOTE — Plan of Care (Signed)
After waking up better from sedation, patient reports 10/10 chest pain.  Tells me she has had this type of pain before, but can't recall whether she's had any type of work-up for it.  I don't see any recent cardiology notes, but do see recent ED visit for syncope.  Patient's endoscopy/colonoscopy went smoothly.  I did not perform an esophageal dilatation.  I do not feel her symptoms are a post-procedural complication.   Patient does have history of GERD but given severity of discomfort, I am sending her to the Emergency Department for further evaluation.  I have spoken with Dr. Zenia Resides (ED) about this case.

## 2020-10-27 NOTE — Op Note (Signed)
[facility] [name] Procedure Date: 10/27/2020 MRN: [id number] Attending MD: Arta Silence , MD Date of Birth: Sep 10, 1970 CSN: [id number] Age: 50 Admit Type: Inpatient Procedure:                Colonoscopy Indications:              Last colonoscopy: 2019, Iron deficiency anemia Providers:                Arta Silence, MD, Erenest Rasher, RN, Ladona Ridgel, Technician, Margurite Auerbach Referring MD:             Dr. Leretha Pol Medicines:                Monitored Anesthesia Care Complications:            No immediate complications. Estimated Blood Loss:     Estimated blood loss: none. Procedure:                Pre-Anesthesia Assessment:                           - Prior to the procedure, a History and Physical                            was performed, and patient medications and                            allergies were reviewed. The patient's tolerance of                            previous anesthesia was also reviewed. The risks                            and benefits of the procedure and the sedation                            options and risks were discussed with the patient.                            All questions were answered, and informed consent                            was obtained. Prior Anticoagulants: The patient has                            taken no previous anticoagulant or antiplatelet                            agents. ASA Grade Assessment: III - A patient with                            severe systemic disease. After reviewing the risks  and benefits, the patient was deemed in                            satisfactory condition to undergo the procedure.                           After obtaining informed consent, the colonoscope                            was passed under direct vision. Throughout the                            procedure, the patient's blood pressure, pulse, and                             oxygen saturations were monitored continuously. The                            [id number]-[id number] ([id number] Olympus pediatric colonscope                            was introduced through the anus and advanced to the                            the terminal ileum, with identification of the                            appendiceal orifice and IC valve. The terminal                            ileum, ileocecal valve, appendiceal orifice, and                            rectum were photographed. The entire colon was                            examined. The colonoscopy was performed without                            difficulty. The patient tolerated the procedure                            well. The quality of the bowel preparation was good. Scope In: 10:51:33 AM Scope Out: 62:37:62 AM Scope Withdrawal Time: 0 hours 9 minutes 45 seconds  Total Procedure Duration: 0 hours 15 minutes 11 seconds  Findings:      The perianal and digital rectal examinations were normal.      A few medium-mouthed diverticula were found in the sigmoid colon and       descending colon.      A 5 mm polyp was found in the descending colon. The polyp was sessile.       The polyp was removed with a cold snare. Resection and retrieval were       complete.      Colon otherwise normal; no other polyps,  masses, vascular ectasias, or       inflammatory changes were seen.      The distal 5cm of the terminal ileum appeared normal. Impression:               - Diverticulosis in the sigmoid colon and in the                            descending colon.                           - One 5 mm polyp in the descending colon, removed                            with a cold snare. Resected and retrieved.                           - The examined portion of the ileum was normal.                           - Otherwise normal to terminal ileum. Moderate Sedation:      None Recommendation:           - Patient has a contact number  available for                            emergencies. The signs and symptoms of potential                            delayed complications were discussed with the                            patient. Return to normal activities tomorrow.                            Written discharge instructions were provided to the                            patient.                           - Discharge patient to home (ambulatory).                           - High fiber diet indefinitely.                           - Continue present medications.                           - Await pathology results.                           - Repeat colonoscopy (date not yet determined) for                            surveillance based on pathology results.                           -  Return to GI clinic PRN. Would not pursue any                            further GI tract evaluation of patient's anemia,                            unless persists/worsens despite (oral/parenteral)                            iron supplementation.                           - Consider speech therapy referral for ongoing                            dysphagia with negative EGD.                           - Return to referring physician as previously                            scheduled. Procedure Code(s):        --- Professional ---                           [phone], Colonoscopy, flexible; with removal of                            tumor(s), polyp(s), or other lesion(s) by snare                            technique Diagnosis Code(s):        --- Professional ---                           K63.5, Polyp of colon                           D50.9, Iron deficiency anemia, unspecified                           K57.30, Diverticulosis of large intestine without                            perforation or abscess without bleeding CPT copyright 2019 [employer]. All rights reserved. The codes documented in this report are preliminary and upon  coder review may  be revised to meet current compliance requirements. Arta Silence, MD 10/27/2020 11:27:24 AM This report has been signed electronically. Number of Addenda: 0

## 2020-10-27 NOTE — ED Notes (Signed)
Dinner tray given

## 2020-10-27 NOTE — Discharge Instructions (Signed)

## 2020-10-27 NOTE — ED Provider Notes (Signed)
[facility] DEPT Provider Note   CSN: [id number] Arrival date & time: 10/27/20  1245     History Chief Complaint  Patient presents with  . Chest Pain  . Suicidal    Diamond Rice is a 50 y.o. female.  Patient presents to ER for complaint of chest pain and suicidal thoughts.  She is transferred here from endoscopy unit.  She just finished receiving endoscopy and afterwards told 13 of stairs that she had thoughts of ending her life with ingestion of pills and complaining of some chest tightness.  Tightness described as improving, mid chest, nonradiating.  She denies any fevers or cough any vomiting or diarrhea.        Past Medical History:  Diagnosis Date  . Anxiety   . Chronic lower back pain 04/17/2012   "just got over some; catched when I walked"  . COPD (chronic obstructive pulmonary disease) ([place])   . Critical lower limb ischemia ([place]) 05/27/2018  . Depression   . Headache(784.0) 04/17/2012   "~ qod; lately waking up in am w/one"  . High cholesterol   . Hypertension   . Migraines 04/17/2012  . Obesity   . Sleep apnea    CPAP  . Stroke ([place])   . Type II diabetes mellitus ([place]) 08/28/2002    Patient Active Problem List   Diagnosis Date Noted  . Severe episode of recurrent major depressive disorder, with psychotic features ([place])   . Acute CVA (cerebrovascular accident) ([place]) 12/06/2019  . AKI (acute kidney injury) ([place]) 12/06/2019  . LVH (left ventricular hypertrophy) 08/10/2019  . Edema of lower extremity 11/26/2018  . Loss of appetite 11/26/2018  . Urinary incontinence 11/26/2018  . Allergic rhinitis 11/14/2018  . Deep venous thrombosis ([place]) 11/14/2018  . Nicotine dependence 11/14/2018  . Hemorrhagic stroke ([place])   . Diastolic dysfunction   . Type 2 diabetes mellitus ([place])   . Anemia of chronic disease   . Chronic obstructive pulmonary disease ([place])   . ICH (intracerebral hemorrhage) ([place]) 08/30/2018  . Hypertensive heart  disease without heart failure 07/04/2018  . Nonrheumatic aortic valve insufficiency 07/04/2018  . Esophageal abnormality   . Class 2 severe obesity due to excess calories with serious comorbidity and body mass index (BMI) of 35.0 to 35.9 in adult [facility])   . Peripheral vascular disease ([place])   . History of osteomyelitis   . History of amputation of lesser toe ([place])   . Pulmonary embolism ([place]) 06/22/2018  . Microcytic anemia 06/17/2018  . At risk for adverse drug reaction 06/11/2018  . Critical lower limb ischemia ([place]) 05/27/2018  . GI bleeding 04/02/2018  . Acute renal failure (ARF) ([place]) 04/02/2018  . Hypokalemia 04/02/2018  . Polysubstance abuse ([place]) 04/02/2018  . Diabetic foot ulcer ([place]) 04/02/2018  . Tinea pedis 03/07/2018  . Diabetic peripheral neuropathy ([place]) 01/24/2018  . Cannabis use disorder, moderate, dependence ([place]) 05/19/2017  . Cocaine use disorder, moderate, dependence ([place]) 05/19/2017  . CKD (chronic kidney disease) stage 3, GFR 30-59 ml/min (HCC) 02/17/2017  . Noncompliance with medications 02/17/2017  . Suicide attempt ([place]) 11/06/2016  . Mild cognitive impairment 03/21/2016  . Moderate episode of recurrent major depressive disorder ([place]) 03/21/2016  . B12 deficiency 03/09/2016  . Iron deficiency anemia 03/07/2016  . History of CVA with residual deficit 12/30/2015  . Schizoaffective disorder, bipolar type ([place]) 10/07/2015  . OSA (obstructive sleep apnea) 06/09/2015  . Vitamin D deficiency 01/06/2015  . Bilateral knee pain 01/05/2015  . Numbness  and tingling in right hand 01/05/2015  . Insomnia 12/07/2014  . Right-sided lacunar infarction ([place]) 09/15/2014  . Left hemiparesis ([place]) 09/15/2014  . Dyspnea   . Back pain 09/11/2013  . Psychoactive substance-induced organic mood disorder ([place]) 05/28/2013  . Cocaine abuse with cocaine-induced mood disorder ([place]) 05/28/2013  . Cannabis dependence with cannabis-induced anxiety disorder ([place]) 05/28/2013  . Morbid  obesity with BMI of 45.0-49.9, adult ([place]) 04/25/2012  . Chest pain 04/17/2012  . Hypertension 04/17/2012  . DM (diabetes mellitus) with peripheral vascular complication ([place]) 96/22/2979  . Hyperlipidemia 04/17/2012  . Tobacco use 04/17/2012    Past Surgical History:  Procedure Laterality Date  . ABDOMINAL AORTOGRAM W/LOWER EXTREMITY Left 05/27/2018   Procedure: ABDOMINAL AORTOGRAM W/LOWER EXTREMITY Runoff and Possible Intervention;  Surgeon: Waynetta Sandy, MD;  Location: [facility] CV LAB;  Service: Cardiovascular;  Laterality: Left;  . APPLICATION OF WOUND VAC Left 06/06/2018   Procedure: APPLICATION OF WOUND VAC;  Surgeon: Waynetta Sandy, MD;  Location: [place];  Service: Vascular;  Laterality: Left;  . BIOPSY  04/04/2018   Procedure: BIOPSY;  Surgeon: [name], MD;  Location: [facility] ENDOSCOPY;  Service: Endoscopy;;  . CARDIAC CATHETERIZATION  ~ 2011  . COLONOSCOPY N/A 04/04/2018   Procedure: COLONOSCOPY;  Surgeon: [name], MD;  Location: [facility] ENDOSCOPY;  Service: Endoscopy;  Laterality: N/A;  . LOWER EXTREMITY ANGIOGRAM Left 05/27/2018  . PERIPHERAL VASCULAR INTERVENTION Left 05/27/2018   Procedure: PERIPHERAL VASCULAR INTERVENTION;  Surgeon: Waynetta Sandy, MD;  Location: [place] CV LAB;  Service: Cardiovascular;  Laterality: Left;  . POLYPECTOMY  04/04/2018   Procedure: POLYPECTOMY;  Surgeon: [name], MD;  Location: [facility] ENDOSCOPY;  Service: Endoscopy;;  . TRANSMETATARSAL AMPUTATION Left 05/28/2018   Procedure: AMPUTATION TOES THREE, FOUR AND FIVE on left foot;  Surgeon: Waynetta Sandy, MD;  Location: [place];  Service: Vascular;  Laterality: Left;  . WOUND DEBRIDEMENT Left 06/06/2018   Procedure: DEBRIDEMENT WOUND LEFT FOOT;  Surgeon: Waynetta Sandy, MD;  Location: [place];  Service: Vascular;  Laterality: Left;     OB History   No obstetric history on file.     Family History  Problem Relation Age of Onset  . Stroke  Brother 37  . Stroke Brother 37  . Heart attack Mother 45  . Stroke Father 51    Social History   Tobacco Use  . Smoking status: Former Smoker    Packs/day: 0.25    Years: 28.00    Pack years: 7.00    Types: Cigarettes  . Smokeless tobacco: Never Used  Vaping Use  . Vaping Use: Never used  Substance Use Topics  . Alcohol use: No    Alcohol/week: 0.0 standard drinks    Comment: rare  . Drug use: Yes    Types: Marijuana, "Crack" cocaine    Comment: marijuana last use days ago; last cocaine several months ago    Home Medications Prior to Admission medications   Medication Sig Start Date End Date Taking? Authorizing Provider  amLODipine (NORVASC) 10 MG tablet Take 1 tablet (10 mg total) by mouth daily. 01/22/20  Yes Wert, Margreta Journey, NP  ARIPiprazole (ABILIFY) 5 MG tablet Take 5 mg by mouth daily. 08/28/20  Yes [provider]  cloNIDine (CATAPRES) 0.3 MG tablet Take 1 tablet (0.3 mg total) by mouth 2 (two) times daily. 01/22/20  Yes Wert, Margreta Journey, NP  escitalopram (LEXAPRO) 10 MG tablet Take 10 mg by mouth daily. 04/29/20  Yes [provider]  hydrALAZINE (APRESOLINE) 100 MG tablet Take 1 tablet (100 mg total) by mouth 3 (three) times daily. 01/22/20  Yes Wert, Margreta Journey, NP  insulin detemir (LEVEMIR) 100 UNIT/ML FlexPen Inject 15 Units into the skin 2 (two) times daily. 01/22/20  Yes Wert, Margreta Journey, NP  lisinopril (ZESTRIL) 40 MG tablet Take 1 tablet (40 mg total) by mouth daily. 01/22/20  Yes Royal Hawthorn, NP  metoprolol succinate (TOPROL-XL) 50 MG 24 hr tablet Take 50 mg by mouth every morning. 08/02/20  Yes [provider]  mirtazapine (REMERON) 15 MG tablet Take 1 tablet (15 mg total) by mouth at bedtime. For Depression 01/22/20  Yes Royal Hawthorn, NP  ACCU-CHEK GUIDE test strip 2 (two) times daily. as directed 02/06/20   [provider]  Accu-Chek Softclix Lancets lancets 2 (two) times daily. 02/06/20   [provider]  ARIPiprazole  (ABILIFY) 15 MG tablet Take 1 tablet (15 mg total) by mouth at bedtime. Patient not taking: No sig reported 01/22/20   Royal Hawthorn, NP  aspirin EC 81 MG EC tablet Take 1 tablet (81 mg total) by mouth daily. Patient not taking: No sig reported 09/21/18   Donzetta Starch, NP  atorvastatin (LIPITOR) 40 MG tablet Take 1 tablet (40 mg total) by mouth daily at 6 PM. Patient not taking: No sig reported 01/22/20   Royal Hawthorn, NP  Blood Glucose Monitoring Suppl (ACCU-CHEK GUIDE) w/Device KIT USE AS DIRECTED TO CHECK BLOOD SUGAR 02/06/20   [provider]  cilostazol (PLETAL) 100 MG tablet Take 1 tablet (100 mg total) by mouth 2 (two) times daily. Patient not taking: No sig reported 01/22/20   Royal Hawthorn, NP  ferrous sulfate 325 (65 FE) MG tablet Take 1 tablet (325 mg total) by mouth daily with breakfast. Patient not taking: No sig reported 08/26/18   Medina-Vargas, Monina C, NP  HYDROcodone-acetaminophen (NORCO/VICODIN) 5-325 MG tablet Take 1 tablet by mouth every 6 (six) hours as needed for severe pain. Patient not taking: No sig reported 02/18/20   Alfredia Client, PA-C  lidocaine (LIDODERM) 5 % Place 1 patch onto the skin daily. Remove & Discard patch within 12 hours or as directed by MD Patient not taking: No sig reported 02/18/20   Alfredia Client, PA-C  methylPREDNISolone (MEDROL DOSEPAK) 4 MG TBPK tablet Per dose pack instructions Patient not taking: No sig reported 02/18/20   Alfredia Client, PA-C  metoprolol tartrate (LOPRESSOR) 25 MG tablet Take 0.5 tablets (12.5 mg total) by mouth 2 (two) times daily. Patient not taking: No sig reported 01/22/20   Royal Hawthorn, NP    Allergies    Morphine and related and Metformin  Review of Systems   Review of Systems  Constitutional: Negative for fever.  HENT: Negative for ear pain.   Eyes: Negative for pain.  Respiratory: Negative for cough.   Cardiovascular: Positive for chest pain.  Gastrointestinal: Negative for abdominal pain.   Genitourinary: Negative for flank pain.  Musculoskeletal: Negative for back pain.  Skin: Negative for rash.  Neurological: Negative for headaches.    Physical Exam Updated Vital Signs BP 134/85 (BP Location: Right Arm)   Pulse 70   Temp 98 F (36.7 C) (Oral)   Resp 18   Ht $R'5\' 1"'As$  (1.549 m)   Wt 80 kg   LMP 04/26/2018 (Approximate)   SpO2 100%   BMI 33.32 kg/m   Physical Exam Constitutional:      General: She is not in acute distress.    Appearance: Normal appearance.  HENT:  Head: Normocephalic.     Nose: Nose normal.  Eyes:     Extraocular Movements: Extraocular movements intact.  Cardiovascular:     Rate and Rhythm: Normal rate.  Pulmonary:     Effort: Pulmonary effort is normal.  Musculoskeletal:        General: Normal range of motion.     Cervical back: Normal range of motion.  Neurological:     General: No focal deficit present.     Mental Status: She is alert. Mental status is at baseline.     ED Results / Procedures / Treatments   Labs (all labs ordered are listed, but only abnormal results are displayed) Labs Reviewed  BASIC METABOLIC PANEL - Abnormal; Notable for the following components:      Result Value   Glucose, Bld 101 (*)    Creatinine, Ser 1.20 (*)    GFR, Estimated 56 (*)    All other components within normal limits  CBC - Abnormal; Notable for the following components:   Hemoglobin 10.7 (*)    HCT 34.9 (*)    MCV 75.4 (*)    MCH 23.1 (*)    RDW 15.9 (*)    All other components within normal limits  ACETAMINOPHEN LEVEL - Abnormal; Notable for the following components:   Acetaminophen (Tylenol), Serum <10 (*)    All other components within normal limits  SALICYLATE LEVEL - Abnormal; Notable for the following components:   Salicylate Lvl <5.8 (*)    All other components within normal limits  HEPATIC FUNCTION PANEL  RAPID URINE DRUG SCREEN, HOSP PERFORMED  I-STAT BETA HCG BLOOD, ED (MC, WL, AP ONLY)  TROPONIN I (HIGH SENSITIVITY)     EKG None  Radiology DG Chest 2 View  Result Date: 10/27/2020 CLINICAL DATA:  Chest pain after sedation today for upper and lower endoscopy. EXAM: CHEST - 2 VIEW COMPARISON:  Single-view of the chest 12/15/2019. FINDINGS: Lungs are clear. Heart size is normal. Aortic atherosclerosis. No pneumothorax or pleural fluid. No acute or focal bony abnormality. IMPRESSION: No acute disease. Aortic Atherosclerosis (ICD10-I70.0). Electronically Signed   By: [name] Rise M.D.   On: 10/27/2020 13:47    Procedures Procedures   Medications Ordered in ED Medications  insulin detemir (LEVEMIR) injection 15 Units (has no administration in time range)  hydrALAZINE (APRESOLINE) tablet 100 mg (has no administration in time range)  ARIPiprazole (ABILIFY) tablet 5 mg (has no administration in time range)  hydrALAZINE (APRESOLINE) tablet 25 mg (25 mg Oral Given 10/27/20 1513)    ED Course  I have reviewed the triage vital signs and the nursing notes.  Pertinent labs & imaging results that were available during my care of the patient were reviewed by me and considered in my medical decision making (see chart for details).    MDM Rules/Calculators/A&P                          Chest x-ray is unremarkable.  Patient appears calm and cooperative at this time.  Plan is to medically clear her and consult TTS for psychiatric evaluation.   Final Clinical Impression(s) / ED Diagnoses Final diagnoses:  Depression, unspecified depression type    Rx / DC Orders ED Discharge Orders    None       Luna Fuse, MD 10/27/20 2045

## 2020-10-27 NOTE — Progress Notes (Signed)
.. Transition of Care [facility]) - Emergency Department Mini Assessment   Patient Details  Name: Diamond Rice MRN: [id number] Date of Birth: Aug 13, 1971  [facility] CM/SW Contact:    Ricquita C Tarpley-Carter, [place] Phone Number: 10/27/2020, 7:00 PM   Clinical Narrative: [facility] CM/CSW consulted with pt.  Pt stated she needed resources for somewhere to live due to the person she is currently living with is moving out of state.  Pt is concern because she can't fully take care of herself alone.  CSW provided pt with resources and a referral.  Passenger transport manager, MSW, LCSW-A Pronouns:  She, Her, [place] ED Transitions of CareClinical Social Worker [email or web address] [phone]  ED Mini Assessment: What brought you to the Emergency Department? : Dysphagia  Barriers to Discharge: No Barriers Identified        Interventions which prevented an admission or readmission: Homeless Screening    Patient Contact and Communications       Contact Date: 10/27/20,          Patient states their goals for this hospitalization and ongoing recovery are:: Pt needed resources for homelessness. CMS Medicare.gov Compare Post Acute Care list provided to:: Patient Choice offered to / list presented to : NA  Admission diagnosis:  chest pain Patient Active Problem List   Diagnosis Date Noted  . Severe episode of recurrent major depressive disorder, with psychotic features ([place])   . Acute CVA (cerebrovascular accident) ([place]) 12/06/2019  . AKI (acute kidney injury) ([place]) 12/06/2019  . LVH (left ventricular hypertrophy) 08/10/2019  . Edema of lower extremity 11/26/2018  . Loss of appetite 11/26/2018  . Urinary incontinence 11/26/2018  . Allergic rhinitis 11/14/2018  . Deep venous thrombosis ([place]) 11/14/2018  . Nicotine dependence 11/14/2018  . Hemorrhagic stroke ([place])   . Diastolic dysfunction   . Type 2 diabetes mellitus  ([place])   . Anemia of chronic disease   . Chronic obstructive pulmonary disease ([place])   . ICH (intracerebral hemorrhage) ([place]) 08/30/2018  . Hypertensive heart disease without heart failure 07/04/2018  . Nonrheumatic aortic valve insufficiency 07/04/2018  . Esophageal abnormality   . Class 2 severe obesity due to excess calories with serious comorbidity and body mass index (BMI) of 35.0 to 35.9 in adult [facility])   . Peripheral vascular disease ([place])   . History of osteomyelitis   . History of amputation of lesser toe ([place])   . Pulmonary embolism ([place]) 06/22/2018  . Microcytic anemia 06/17/2018  . At risk for adverse drug reaction 06/11/2018  . Critical lower limb ischemia ([place]) 05/27/2018  . GI bleeding 04/02/2018  . Acute renal failure (ARF) ([place]) 04/02/2018  . Hypokalemia 04/02/2018  . Polysubstance abuse ([place]) 04/02/2018  . Diabetic foot ulcer ([place]) 04/02/2018  . Tinea pedis 03/07/2018  . Diabetic peripheral neuropathy ([place]) 01/24/2018  . Cannabis use disorder, moderate, dependence ([place]) 05/19/2017  . Cocaine use disorder, moderate, dependence ([place]) 05/19/2017  . CKD (chronic kidney disease) stage 3, GFR 30-59 ml/min (HCC) 02/17/2017  . Noncompliance with medications 02/17/2017  . Suicide attempt ([place]) 11/06/2016  . Mild cognitive impairment 03/21/2016  . Moderate episode of recurrent major depressive disorder ([place]) 03/21/2016  . B12 deficiency 03/09/2016  . Iron deficiency anemia 03/07/2016  . History of CVA with residual deficit 12/30/2015  . Schizoaffective disorder, bipolar type ([place]) 10/07/2015  . OSA (obstructive sleep apnea) 06/09/2015  . Vitamin  D deficiency 01/06/2015  . Bilateral knee pain 01/05/2015  . Numbness and tingling in right hand 01/05/2015  . Insomnia 12/07/2014  . Right-sided lacunar infarction ([place]) 09/15/2014  . Left hemiparesis ([place]) 09/15/2014  . Dyspnea   . Back pain 09/11/2013  . Psychoactive substance-induced organic mood disorder ([place]) 05/28/2013   . Cocaine abuse with cocaine-induced mood disorder ([place]) 05/28/2013  . Cannabis dependence with cannabis-induced anxiety disorder ([place]) 05/28/2013  . Morbid obesity with BMI of 45.0-49.9, adult ([place]) 04/25/2012  . Chest pain 04/17/2012  . Hypertension 04/17/2012  . DM (diabetes mellitus) with peripheral vascular complication ([place]) 70/96/2836  . Hyperlipidemia 04/17/2012  . Tobacco use 04/17/2012   PCP:  [name], MD Pharmacy:   [facility] [place], [address] [place] [address] Phone: [phone] Fax: [phone]

## 2020-10-27 NOTE — Transfer of Care (Signed)
Immediate Anesthesia Transfer of Care Note  Patient: Diamond Rice  Procedure(s) Performed: ESOPHAGOGASTRODUODENOSCOPY (EGD) WITH PROPOFOL  WITH POSSIBLE DIL (N/A ) COLONOSCOPY WITH PROPOFOL (N/A ) BIOPSY POLYPECTOMY  Patient Location: PACU and Endoscopy Unit  Anesthesia Type:MAC  Level of Consciousness: awake, alert  and oriented  Airway & Oxygen Therapy: Patient Spontanous Breathing and Patient connected to face mask  Post-op Assessment: Report given to RN and Post -op Vital signs reviewed and stable  Post vital signs: Reviewed and stable  Last Vitals:  Vitals Value Taken Time  BP    Temp    Pulse    Resp    SpO2      Last Pain:  Vitals:   10/27/20 0954  TempSrc: Oral  PainSc: 0-No pain         Complications: No complications documented.

## 2020-10-27 NOTE — Anesthesia Preprocedure Evaluation (Addendum)
Anesthesia Evaluation  Patient identified by MRN, date of birth, ID band Patient awake    Reviewed: Allergy & Precautions, NPO status , Patient's Chart, lab work & pertinent test results, reviewed documented beta blocker date and time   History of Anesthesia Complications Negative for: history of anesthetic complications  Airway Mallampati: II  TM Distance: >3 FB Neck ROM: Full    Dental  (+) Dental Advisory Given   Pulmonary sleep apnea and Continuous Positive Airway Pressure Ventilation , COPD, Current Smoker and Patient abstained from smoking.,  10/25/2020 SARS coronavirus NEG   breath sounds clear to auscultation       Cardiovascular hypertension, Pt. on medications and Pt. on home beta blockers (-) angina+ Peripheral Vascular Disease   Rhythm:Regular Rate:Normal  11/2019 ECHO: EF 65-70%. LV has normal function.  moderate concentric LVH. Grade I diastolic dysfunction,  trivial MR, mild AS with mod AI   Neuro/Psych  Headaches, Anxiety Depression Bipolar Disorder Schizophrenia CVA (L hemiparesis), Residual Symptoms    GI/Hepatic negative GI ROS, (+)     substance abuse  cocaine use and marijuana use,   Endo/Other  diabetes, Insulin Dependent  Renal/GU Renal InsufficiencyRenal disease     Musculoskeletal   Abdominal   Peds  Hematology   Anesthesia Other Findings   Reproductive/Obstetrics                           Anesthesia Physical Anesthesia Plan  ASA: III  Anesthesia Plan: MAC   Post-op Pain Management:    Induction:   PONV Risk Score and Plan: 2 and Treatment may vary due to age or medical condition  Airway Management Planned: Natural Airway and Nasal Cannula  Additional Equipment: None  Intra-op Plan:   Post-operative Plan:   Informed Consent: I have reviewed the patients History and Physical, chart, labs and discussed the procedure including the risks, benefits and  alternatives for the proposed anesthesia with the patient or authorized representative who has indicated his/her understanding and acceptance.     Dental advisory given  Plan Discussed with: CRNA and Surgeon  Anesthesia Plan Comments:        Anesthesia Quick Evaluation

## 2020-10-27 NOTE — Op Note (Signed)
[facility] [name] Procedure Date: 10/27/2020 MRN: [id number] Attending MD: Arta Silence , MD Date of Birth: 02-18-1971 CSN: [id number] Age: 50 Admit Type: Inpatient Procedure:                Upper GI endoscopy Indications:              Iron deficiency anemia, Dysphagia Providers:                Arta Silence, MD, Erenest Rasher, RN, Ladona Ridgel, Technician, Margurite Auerbach Referring MD:             Dr. Leretha Pol Medicines:                Monitored Anesthesia Care Complications:            No immediate complications. Estimated Blood Loss:     Estimated blood loss: none. Procedure:                Pre-Anesthesia Assessment:                           - Prior to the procedure, a History and Physical                            was performed, and patient medications and                            allergies were reviewed. The patient's tolerance of                            previous anesthesia was also reviewed. The risks                            and benefits of the procedure and the sedation                            options and risks were discussed with the patient.                            All questions were answered, and informed consent                            was obtained. Prior Anticoagulants: The patient has                            taken no previous anticoagulant or antiplatelet                            agents. ASA Grade Assessment: III - A patient with                            severe systemic disease. After reviewing the risks  and benefits, the patient was deemed in                            satisfactory condition to undergo the procedure.                           After obtaining informed consent, the endoscope was                            passed under direct vision. Throughout the                            procedure, the patient's blood pressure, pulse, and                             oxygen saturations were monitored continuously. The                            GIF-H190 ([id number] Olympus gastroscope was                            introduced through the mouth, and advanced to the                            second part of duodenum. The upper GI endoscopy was                            accomplished without difficulty. The patient                            tolerated the procedure well. Scope In: Scope Out: Findings:      The examined esophagus was normal. No esophageal mass. No esophageal       stricture. No esophageal inflammation.      Patchy mild inflammation was found in the gastric body and in the       gastric antrum. Biopsies were taken with a cold forceps for histology.      The exam of the stomach was otherwise normal.      The duodenal bulb, first portion of the duodenum and second portion of       the duodenum were normal. Biopsies for histology were taken with a cold       forceps for evaluation of celiac disease. Impression:               - Normal esophagus. Suspect dysphagia is likely                            from history of stroke.                           - Gastritis. Biopsied.                           - Normal duodenal bulb, first portion of the  duodenum and second portion of the duodenum.                            Biopsied. Moderate Sedation:      Not Applicable - Patient had care per Anesthesia. Recommendation:           - Await pathology results.                           - Perform a colonoscopy today. Procedure Code(s):        --- Professional ---                           [phone], Esophagogastroduodenoscopy, flexible,                            transoral; with biopsy, single or multiple Diagnosis Code(s):        --- Professional ---                           K29.70, Gastritis, unspecified, without bleeding                           D50.9, Iron deficiency anemia, unspecified                            R13.10, Dysphagia, unspecified CPT copyright 2019 American Medical Association. All rights reserved. The codes documented in this report are preliminary and upon coder review may  be revised to meet current compliance requirements. Arta Silence, MD 10/27/2020 11:23:57 AM This report has been signed electronically. Number of Addenda: 0
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
## 2020-10-27 NOTE — Anesthesia Postprocedure Evaluation (Signed)
Anesthesia Post Note  Patient: Diamond Rice  Procedure(s) Performed: ESOPHAGOGASTRODUODENOSCOPY (EGD) WITH PROPOFOL  WITH POSSIBLE DIL (N/A ) COLONOSCOPY WITH PROPOFOL (N/A ) BIOPSY POLYPECTOMY     Patient location during evaluation: Endoscopy Anesthesia Type: MAC Level of consciousness: awake and alert, patient cooperative and oriented Pain management: pain level not controlled Vital Signs Assessment: post-procedure vital signs reviewed and stable Respiratory status: spontaneous breathing, nonlabored ventilation, respiratory function stable and patient connected to nasal cannula oxygen Cardiovascular status: blood pressure returned to baseline and stable Postop Assessment: no apparent nausea or vomiting Anesthetic complications: no Comments: Dr. Havery Moros sent patient to ED with c/o Chest pain and suicidal ideation   No complications documented.  Last Vitals:  Vitals:   10/27/20 1150 10/27/20 1200  BP: (!) 174/85 (!) 155/80  Pulse: 62 (!) 58  Resp: 13 20  Temp:    SpO2: 98% 98%    Last Pain:  Vitals:   10/27/20 1200  TempSrc:   PainSc: 10-Worst pain ever                 Loeta Herst,E. Kashis Penley

## 2020-10-27 NOTE — Progress Notes (Addendum)
TOC CM/CSW faxed a referral to [facility] for Mom.  Please reconsult if new social work issues arise, CSW signing off.  Dhilan Brauer Tarpley-Carter, MSW, LCSW-A Pronouns:  She, Her, Hers                  [facility] Transitions of CareClinical Social Worker [email or web address] [phone]

## 2020-10-27 NOTE — Progress Notes (Signed)
Post endoscopic procedure, patient stated sudden chest pain of 10 out of 10. MD made aware. Charge RN made aware. New orders to send patient to ED.

## 2020-10-27 NOTE — H&P (Signed)
[facility] Admission Note  Chief Complaint: dysphagia, anemia  HPI: Diamond Rice is an 50 y.o. female.  Worsening dysphagia after stroke (solids and pills).  Acute on chronic anemia.  Personal history of colonoscopy done elsewhere 2019.  Past Medical History:  Diagnosis Date  . Anxiety   . Chronic lower back pain 04/17/2012   "just got over some; catched when I walked"  . COPD (chronic obstructive pulmonary disease) ([place])   . Critical lower limb ischemia ([place]) 05/27/2018  . Depression   . Headache(784.0) 04/17/2012   "~ qod; lately waking up in am w/one"  . High cholesterol   . Hypertension   . Migraines 04/17/2012  . Obesity   . Sleep apnea    CPAP  . Stroke ([place])   . Type II diabetes mellitus ([place]) 08/28/2002    Past Surgical History:  Procedure Laterality Date  . ABDOMINAL AORTOGRAM W/LOWER EXTREMITY Left 05/27/2018   Procedure: ABDOMINAL AORTOGRAM W/LOWER EXTREMITY [place] and Possible Intervention;  Surgeon: Waynetta Sandy, MD;  Location: [place] [facility];  Service: Cardiovascular;  Laterality: Left;  . APPLICATION OF WOUND VAC Left 06/06/2018   Procedure: APPLICATION OF WOUND VAC;  Surgeon: Waynetta Sandy, MD;  Location: [place];  Service: Vascular;  Laterality: Left;  . BIOPSY  04/04/2018   Procedure: BIOPSY;  Surgeon: [name], MD;  Location: [facility] ENDOSCOPY;  Service: Endoscopy;;  . CARDIAC CATHETERIZATION  ~ 2011  . COLONOSCOPY N/A 04/04/2018   Procedure: COLONOSCOPY;  Surgeon: [name], MD;  Location: [facility] ENDOSCOPY;  Service: Endoscopy;  Laterality: N/A;  . LOWER EXTREMITY ANGIOGRAM Left 05/27/2018  . PERIPHERAL VASCULAR INTERVENTION Left 05/27/2018   Procedure: PERIPHERAL VASCULAR INTERVENTION;  Surgeon: Waynetta Sandy, MD;  Location: [place] [facility];  Service: Cardiovascular;  Laterality: Left;  . POLYPECTOMY  04/04/2018   Procedure: POLYPECTOMY;  Surgeon: [name], MD;  Location: [facility] ENDOSCOPY;  Service:  Endoscopy;;  . TRANSMETATARSAL AMPUTATION Left 05/28/2018   Procedure: AMPUTATION TOES THREE, FOUR AND FIVE on left foot;  Surgeon: Waynetta Sandy, MD;  Location: [place];  Service: Vascular;  Laterality: Left;  . WOUND DEBRIDEMENT Left 06/06/2018   Procedure: DEBRIDEMENT WOUND LEFT FOOT;  Surgeon: Waynetta Sandy, MD;  Location: [place];  Service: Vascular;  Laterality: Left;    Medications Prior to Admission  Medication Sig Dispense Refill  . ACCU-CHEK GUIDE test strip 2 (two) times daily. as directed    . Accu-Chek Softclix Lancets lancets 2 (two) times daily.    Marland Kitchen amLODipine (NORVASC) 10 MG tablet Take 1 tablet (10 mg total) by mouth daily. 30 tablet 0  . ARIPiprazole (ABILIFY) 5 MG tablet Take 5 mg by mouth daily.    . Blood Glucose Monitoring Suppl (ACCU-CHEK GUIDE) w/Device KIT USE AS DIRECTED TO CHECK BLOOD SUGAR    . cloNIDine (CATAPRES) 0.3 MG tablet Take 1 tablet (0.3 mg total) by mouth 2 (two) times daily. 60 tablet 0  . escitalopram (LEXAPRO) 10 MG tablet Take 10 mg by mouth daily.    . hydrALAZINE (APRESOLINE) 100 MG tablet Take 1 tablet (100 mg total) by mouth 3 (three) times daily. 90 tablet 0  . insulin detemir (LEVEMIR) 100 UNIT/ML FlexPen Inject 15 Units into the skin 2 (two) times daily. 15 mL 0  . lisinopril (ZESTRIL) 40 MG tablet Take 1 tablet (40 mg total) by mouth daily. 30 tablet 0  . metoprolol succinate (TOPROL-XL) 50 MG 24 hr tablet Take 50 mg by mouth every  morning.    . mirtazapine (REMERON) 15 MG tablet Take 1 tablet (15 mg total) by mouth at bedtime. For Depression 30 tablet 0  . rosuvastatin (CRESTOR) 40 MG tablet     . ARIPiprazole (ABILIFY) 15 MG tablet Take 1 tablet (15 mg total) by mouth at bedtime. (Patient not taking: Reported on 10/19/2020) 30 tablet 0  . aspirin EC 81 MG EC tablet Take 1 tablet (81 mg total) by mouth daily. (Patient not taking: No sig reported)    . atorvastatin (LIPITOR) 40 MG tablet Take 1 tablet (40 mg total) by mouth  daily at 6 PM. (Patient not taking: No sig reported)    . bisacodyl (DULCOLAX) 10 MG suppository Place 10 mg rectally daily as needed for mild constipation or moderate constipation (if no relief from Milk of Magnesia). (Patient not taking: No sig reported)    . cilostazol (PLETAL) 100 MG tablet Take 1 tablet (100 mg total) by mouth 2 (two) times daily. (Patient not taking: No sig reported) 60 tablet 0  . ferrous sulfate 325 (65 FE) MG tablet Take 1 tablet (325 mg total) by mouth daily with breakfast. (Patient not taking: No sig reported) 30 tablet 0  . HYDROcodone-acetaminophen (NORCO/VICODIN) 5-325 MG tablet Take 1 tablet by mouth every 6 (six) hours as needed for severe pain. (Patient not taking: No sig reported) 4 tablet 0  . iron polysaccharides (NIFEREX) 150 MG capsule Nu-Iron 150 mg iron capsule  Take 1 capsule twice a day by oral route with meals. (Patient not taking: No sig reported)    . lactulose (CHRONULAC) 10 GM/15ML solution Take 30 g by mouth 2 (two) times daily. For Constipation (Patient not taking: No sig reported)    . lidocaine (LIDODERM) 5 % Place 1 patch onto the skin daily. Remove & Discard patch within 12 hours or as directed by MD (Patient not taking: No sig reported) 30 patch 0  . magnesium hydroxide (MILK OF MAGNESIA) 400 MG/5ML suspension Take 30 mLs by mouth daily as needed for mild constipation. (Patient not taking: No sig reported)    . methylPREDNISolone (MEDROL DOSEPAK) 4 MG TBPK tablet Per dose pack instructions (Patient not taking: No sig reported) 21 tablet 0  . metoprolol tartrate (LOPRESSOR) 25 MG tablet Take 0.5 tablets (12.5 mg total) by mouth 2 (two) times daily. (Patient not taking: No sig reported) 60 tablet 0  . Nutritional Supplement LIQD Take 120 mLs by mouth daily. MedPass (Patient not taking: No sig reported)    . Nutritional Supplements (NUTRITIONAL SUPPLEMENT PO) Take 1 each by mouth daily. Magic Cup (Patient not taking: No sig reported)    .  senna-docusate (SENOKOT-S) 8.6-50 MG tablet Take 2 tablets by mouth 2 (two) times daily. For Constipation (Patient not taking: No sig reported)      Allergies:  Allergies  Allergen Reactions  . Morphine And Related Hives  . Metformin Diarrhea and Other (See Comments)    "Allergic," per [facility]    Family History  Problem Relation Age of Onset  . Stroke Brother 8  . Stroke Brother 37  . Heart attack Mother 15  . Stroke Father 36    Social History:  reports that she has quit smoking. Her smoking use included cigarettes. She has a 7.00 pack-year smoking history. She has never used smokeless tobacco. She reports current drug use. Drugs: Marijuana and "Crack" cocaine. She reports that she does not drink alcohol.   ROS: As per HPI, all others negative   Blood  pressure (!) 164/121, pulse 65, temperature 98.6 F (37 C), temperature source Oral, resp. rate 12, height _0  (1.549 m), weight 80.3 kg, last menstrual period 04/26/2018, SpO2 100 %. General appearance: NAD HEENT:  NCAT, Anicteric NEURO:  Hemiparesis, expressive aphasia ABD:  Soft, non-tender  Results for orders placed or performed during the hospital encounter of 10/27/20 (from the past 48 hour(s))  Glucose, capillary     Status: Abnormal   Collection Time: 10/27/20 10:13 AM  Result Value Ref Range   Glucose-Capillary 110 (H) 70 - 99 mg/dL    Comment: Glucose reference range applies only to samples taken after fasting for at least 8 hours.   No results found.  Assessment/Plan  1.  CVA, one year ago, with hemiparesis and expressive aphasia. 2.  Dysphagia, worse after stroke. 3.  Microcytic anemia. 4.  Personal history of colonic polyps. 5.  Endoscopy with possible esophageal dilatation. 6.  Risks (bleeding, infection, bowel perforation that could require surgery, sedation-related changes in cardiopulmonary systems), benefits (identification and possible treatment of source of symptoms, exclusion of certain causes of  symptoms), and alternatives (watchful waiting, radiographic imaging studies, empiric medical treatment) of upper endoscopy with possible esophageal dilatation (EGD +/- DIL) were explained to patient/family in detail and patient wishes to proceed. 7.  Colonoscopy. 8.  Risks (bleeding, infection, bowel perforation that could require surgery, sedation-related changes in cardiopulmonary systems), benefits (identification and possible treatment of source of symptoms, exclusion of certain causes of symptoms), and alternatives (watchful waiting, radiographic imaging studies, empiric medical treatment) of colonoscopy were explained to patient/family in detail and patient wishes to proceed.  Landry Dyke 10/27/2020, 10:24 AM

## 2020-10-27 NOTE — ED Triage Notes (Signed)
Patient transferred to ED from ENDO.  Patient is losing her housing in a month and has no place to go.  ENDO did a case manager consult for resources.  When asked about SI/HI, patient reports that she does have SI and has a plan in place to take a handful of medications.  Patient also reports a prior suicide attempt by taking pills.  Patient then reported a 10/10 chest pain so decision was made to transfer her to the ED.

## 2020-10-27 NOTE — ED Notes (Signed)
TTS cart at bedside. 

## 2020-10-28 ENCOUNTER — Encounter (HOSPITAL_COMMUNITY): Payer: Self-pay | Admitting: Gastroenterology

## 2020-10-28 DIAGNOSIS — D509 Iron deficiency anemia, unspecified: Secondary | ICD-10-CM | POA: Diagnosis not present

## 2020-10-28 LAB — CBG MONITORING, ED
Glucose-Capillary: 97 mg/dL (ref 70–99)
Glucose-Capillary: 116 mg/dL — ABNORMAL HIGH (ref 70–99)

## 2020-10-28 LAB — SURGICAL PATHOLOGY

## 2020-10-28 NOTE — BH Assessment (Addendum)
Received follow up from Williams Maudie Mercury, [place]) that patient will need to faxed out for bed placement needs. Disposition Counselor faxed patient to the following facilities for consideration of bed placement:  [facility] Details  [facility] Details  [facility] Details  [facility]  Details [facility] Details  [facility] Details  [facility] Details  [facility] Details  [facility] Details  [facility] Details  [facility] Details  [facility] Details  [facility] Details  [facility] Details  [facility] Details  [facility] Details  [facility] Details  [facility] Details  [facility] Details  [facility] Details  [facility] Details  [facility] Details  [place]

## 2020-10-28 NOTE — BH Assessment (Addendum)
Per report from [facility] Disposition Counselor Jalene Mullet) at shift change, patient if under review at [place] followed up with Ruth Maudie Mercury) regarding patient's status for admission to [facility]. Patient remains under review. Clinician will continue to follow up.

## 2020-10-28 NOTE — ED Provider Notes (Signed)
Emergency Medicine Observation Re-evaluation Note  Diamond Rice is a 50 y.o. female, seen on rounds today.  Pt initially presented to the ED for complaints of Chest Pain and Suicidal Currently, the patient is resting comfortably.  Physical Exam  BP (!) 128/98 (BP Location: Right Arm)   Pulse 84   Temp 98.2 F (36.8 C) (Oral)   Resp 18   Ht 1.549 m (5\' 1" )   Wt 80 kg   LMP 04/26/2018 (Approximate)   SpO2 99%   BMI 33.32 kg/m  Physical Exam General: resting Cardiac: regular rate Lungs: breathing comfortably Psych: resting, calm.  ED Course / MDM  EKG:    I have reviewed the labs performed to date as well as medications administered while in observation.  Recent changes in the last 24 hours include BH reassessment, stabilization on meds, and placement efforts. .  Plan  Current plan is for inpatient [facility] treatment.    Lajean Saver, MD 10/28/20 0700

## 2020-10-28 NOTE — ED Notes (Signed)
Pt had sandwich, some cheese, juice and crackers.

## 2020-10-29 DIAGNOSIS — D509 Iron deficiency anemia, unspecified: Secondary | ICD-10-CM | POA: Diagnosis not present

## 2020-10-29 DIAGNOSIS — F333 Major depressive disorder, recurrent, severe with psychotic symptoms: Secondary | ICD-10-CM | POA: Diagnosis not present

## 2020-10-29 LAB — CBG MONITORING, ED
Glucose-Capillary: 143 mg/dL — ABNORMAL HIGH (ref 70–99)
Glucose-Capillary: 180 mg/dL — ABNORMAL HIGH (ref 70–99)

## 2020-10-29 NOTE — ED Notes (Signed)
Waiting for [facility] for disposition arrangements.

## 2020-10-29 NOTE — Progress Notes (Signed)
TOC CM spoke to pt at bedside. States she lives in her own apt with a roommate. Contacted brother and states he cannot provide transportation. He lives in another city. Pt signed [employer] waiver, scan to transportation. Provided pt a copy. [employer] called. [place], [place] ED TOC CM [phone]

## 2020-10-29 NOTE — ED Provider Notes (Signed)
Patient seen by psychiatry team and social worker and given resources.  Cleared for continued outpatient therapy and treatment.   Luna Fuse, MD 10/29/20 1115

## 2020-10-29 NOTE — ED Notes (Signed)
Informed Dr. Almyra Free of pt's BP 185/112 and pulse of 112.

## 2020-10-29 NOTE — ED Notes (Signed)
Pt cleared by Dr. Almyra Free for discharge.

## 2020-10-29 NOTE — Consult Note (Signed)
Telepsych Consultation   Reason for Consult:  Psychiatry provider reassessment Referring Physician:  Dr Almyra Free Location of [facility]: [facility] Location of Provider: [facility]  Patient Identification: [facility] MRN:  [id number] Principal Diagnosis: Severe episode of recurrent major depressive disorder, with psychotic features ([place]) Diagnosis:  Principal Problem:   Severe episode of recurrent major depressive disorder, with psychotic features ([place])   Total Time spent with patient: 30 minutes  Subjective:   [facility] is a 50 y.o. female patient admitted with suicidal ideation.  Patient states "I am ready to go home."  Patient reports she has been provided housing resources by social work and looks forward to follow-up once she is discharged home.  HPI:   Patient reassessed by nurse practitioner.  Patient alert and oriented, answers appropriately.  Patient pleasant and cooperative during assessment.  Patient denies suicidal ideations today.  Patient contracts verbally for safety with this Probation officer.  Patient endorses history of 2 prior suicide attempts, last attempt in 2017.  Patient denies self-harm behaviors.  Patient has been diagnosed with major depressive disorder.  Patient followed outpatient by Dr. Latricia Heft.  Patient reports compliance with medication.  Patient reports recent stressors include seeking new housing as she has lived with a friend for several years but that friend will be moving out of town next month.  Patient reports while she does have partial paralysis on her left side related to a previous stroke she is able to manage her own ADLs and medications adequately.  Patient also reports her older brother died 2 days before Christmas in 2021 and she continues to mourn his death.  Patient currently resides in [place] with a friend.  Patient denies access to weapons.  Patient receives disability benefits.  Patient endorses  average sleep and appetite.  Patient denies alcohol use.  Patient endorses occasional marijuana use, patient denies substance use aside from marijuana.  Patient offered support and encouragement.  Patient gives verbal consent to speak with her friend with whom she lives, [name] phone number [phone].  HIPAA compliant voicemail left.  Past Psychiatric History: Major depressive disorder, suicide attempt, cannabis use disorder, cocaine use disorder, schizoaffective disorder, psychoactive substance induced organic mood disorder, polysubstance abuse, cocaine abuse with cocaine induced mood disorder  Risk to Self:  Denies Risk to Others:  Denies Prior Inpatient Therapy:  None reported Prior Outpatient Therapy:  Currently followed by outpatient psychiatry, [facility]  Past Medical History:  Past Medical History:  Diagnosis Date  . Anxiety   . Chronic lower back pain 04/17/2012   "just got over some; catched when I walked"  . COPD (chronic obstructive pulmonary disease) ([place])   . Critical lower limb ischemia ([place]) 05/27/2018  . Depression   . Headache(784.0) 04/17/2012   "~ qod; lately waking up in am w/one"  . High cholesterol   . Hypertension   . Migraines 04/17/2012  . Obesity   . Sleep apnea    CPAP  . Stroke ([place])   . Type II diabetes mellitus ([place]) 08/28/2002    Past Surgical History:  Procedure Laterality Date  . ABDOMINAL AORTOGRAM W/LOWER EXTREMITY Left 05/27/2018   Procedure: ABDOMINAL AORTOGRAM W/LOWER EXTREMITY Runoff and Possible Intervention;  Surgeon: Waynetta Sandy, MD;  Location: [facility];  Service: Cardiovascular;  Laterality: Left;  . APPLICATION OF WOUND VAC Left 06/06/2018   Procedure: APPLICATION OF WOUND VAC;  Surgeon: Waynetta Sandy, MD;  Location: [facility];  Service: Vascular;  Laterality: Left;  . BIOPSY  04/04/2018   Procedure: BIOPSY;  Surgeon: [name], MD;  Location: [facility] ENDOSCOPY;  Service: Endoscopy;;  . BIOPSY   10/27/2020   Procedure: BIOPSY;  Surgeon: Arta Silence, MD;  Location: [facility]COPY;  Service: Endoscopy;;  . CARDIAC CATHETERIZATION  ~ 2011  . COLONOSCOPY N/A 04/04/2018   Procedure: COLONOSCOPY;  Surgeon: [name], MD;  Location[facility] ENDOSCOPY;  Service: Endoscopy;  Laterality: N/A;  . COLONOSCOPY WITH PROPOFOL N/A 10/27/2020   Procedure: COLONOSCOPY WITH PROPOFOL;  Surgeon: Arta Silence, MD;  Location: [facility]OSCOPY;  Service: Endoscopy;  Laterality: N/A;  . ESOPHAGOGASTRODUODENOSCOPY (EGD) WITH PROPOFOL N/A 10/27/2020   Procedure: ESOPHAGOGASTRODUODENOSCOPY (EGD) WITH PROPOFOL  WITH POSSIBLE DIL;  Surgeon: Arta Silence, MD;  Location: [facility];  Service: Endoscopy;  Laterality: N/A;  . LOWER EXTREMITY ANGIOGRAM Left 05/27/2018  . PERIPHERAL VASCULAR INTERVENTION Left 05/27/2018   Procedure: PERIPHERAL VASCULAR INTERVENTION;  Surgeon: Waynetta Sandy, MD;  Location: [facility];  Service: Cardiovascular;  Laterality: Left;  . POLYPECTOMY  04/04/2018   Procedure: POLYPECTOMY;  Surgeon: [name], MD;  Location: [facility] ENDOSCOPY;  Service: Endoscopy;;  . POLYPECTOMY  10/27/2020   Procedure: POLYPECTOMY;  Surgeon: Arta Silence, MD;  Location: [facility];  Service: Endoscopy;;  . TRANSMETATARSAL AMPUTATION Left 05/28/2018   Procedure: AMPUTATION TOES THREE, FOUR AND FIVE on left foot;  Surgeon: Waynetta Sandy, MD;  Location: [facility];  Service: Vascular;  Laterality: Left;  . WOUND DEBRIDEMENT Left 06/06/2018   Procedure: DEBRIDEMENT WOUND LEFT FOOT;  Surgeon: Waynetta Sandy, MD;  Location: [facility] OR;  Service: Vascular;  Laterality: Left;   Family History:  Family History  Problem Relation Age of Onset  . Stroke Brother 13  . Stroke Brother 26  . Heart attack Mother 45  . Stroke Father 52   Family Psychiatric  History:  Social History:  Social History   Substance and Sexual Activity  Alcohol Use No  . Alcohol/week: 0.0 standard drinks    Comment: rare     Social History   Substance and Sexual Activity  Drug Use Yes  . Types: Marijuana, "Crack" cocaine   Comment: marijuana last use days ago; last cocaine several months ago    Social History   Socioeconomic History  . Marital status: Single    Spouse name: Not on file  . Number of children: Not on file  . Years of education: Not on file  . Highest education level: Not on file  Occupational History  . Occupation: disabled  Tobacco Use  . Smoking status: Former Smoker    Packs/day: 0.25    Years: 28.00    Pack years: 7.00    Types: Cigarettes  . Smokeless tobacco: Never Used  Vaping Use  . Vaping Use: Never used  Substance and Sexual Activity  . Alcohol use: No    Alcohol/week: 0.0 standard drinks    Comment: rare  . Drug use: Yes    Types: Marijuana, "Crack" cocaine    Comment: marijuana last use days ago; last cocaine several months ago  . Sexual activity: Not Currently    Birth control/protection: None  Other Topics Concern  . Not on file  Social History Narrative   Was a  Quarry manager at [employer]. Lives with godmother.     Social Determinants of Health   Financial Resource Strain: Not on file  Food Insecurity: Not on file  Transportation Needs: Not on file  Physical Activity: Not on file  Stress: Not on file  Social Connections: Not on file   Additional Social History:    Allergies:   Allergies  Allergen Reactions  . Morphine And Related Hives  . Metformin Diarrhea and Other (See Comments)    "Allergic," per [facility]    Labs:  Results for orders placed or performed during the hospital encounter of 10/27/20 (from the past 48 hour(s))  Basic metabolic panel     Status: Abnormal   Collection Time: 10/27/20  1:02 PM  Result Value Ref Range   Sodium 140 135 - 145 mmol/L   Potassium 3.8 3.5 - 5.1 mmol/L   Chloride 105 98 - 111 mmol/L   CO2 28 22 - 32 mmol/L   Glucose, Bld 101 (H) 70 - 99 mg/dL    Comment: Glucose reference range applies only to  samples taken after fasting for at least 8 hours.   BUN 11 6 - 20 mg/dL   Creatinine, Ser 1.20 (H) 0.44 - 1.00 mg/dL   Calcium 9.1 8.9 - 10.3 mg/dL   GFR, Estimated 56 (L) >60 mL/min    Comment: (NOTE) Calculated using the CKD-EPI Creatinine Equation (2021)    Anion gap 7 5 - 15    Comment: Performed at [facility], [place] [address], [place], [place] [address]  CBC     Status: Abnormal   Collection Time: 10/27/20  1:02 PM  Result Value Ref Range   WBC 4.8 4.0 - 10.5 K/uL   RBC 4.63 3.87 - 5.11 MIL/uL   Hemoglobin 10.7 (L) 12.0 - 15.0 g/dL   HCT 34.9 (L) 36.0 - 46.0 %   MCV 75.4 (L) 80.0 - 100.0 fL   MCH 23.1 (L) 26.0 - 34.0 pg   MCHC 30.7 30.0 - 36.0 g/dL   RDW 15.9 (H) 11.5 - 15.5 %   Platelets 237 150 - 400 K/uL   nRBC 0.0 0.0 - 0.2 %    Comment: Performed at [facility], [place] [address], [place], [place] [address]  Troponin I (High Sensitivity)     Status: None   Collection Time: 10/27/20  1:02 PM  Result Value Ref Range   Troponin I (High Sensitivity) <2 <18 ng/L    Comment: (NOTE) Elevated high sensitivity troponin I (hsTnI) values and significant  changes across serial measurements may suggest ACS but many other  chronic and acute conditions are known to elevate hsTnI results.  Refer to the "Links" section for chest pain algorithms and additional  guidance. Performed at [facility], [place] [address] [place], [place] [address]   Hepatic function panel     Status: None   Collection Time: 10/27/20  1:02 PM  Result Value Ref Range   Total Protein 7.6 6.5 - 8.1 g/dL   Albumin 3.7 3.5 - 5.0 g/dL   AST 19 15 - 41 U/L   ALT 10 0 - 44 U/L   Alkaline Phosphatase 56 38 - 126 U/L   Total Bilirubin 0.6 0.3 - 1.2 mg/dL   Bilirubin, Direct 0.1 0.0 - 0.2 mg/dL   Indirect Bilirubin 0.5 0.3 - 0.9 mg/dL    Comment: Performed at [facility], [place] [address], [place], [place] [address]  Acetaminophen level      Status: Abnormal   Collection Time: 10/27/20  1:21 PM  Result Value Ref Range   Acetaminophen (Tylenol), Serum <10 (L) 10 - 30 ug/mL    Comment: (NOTE) Therapeutic concentrations vary significantly. A range of 10-30  ug/mL  may be an effective concentration for many patients. However, some  are best treated at concentrations outside of this range. Acetaminophen concentrations >150 ug/mL at 4 hours after ingestion  and >50 ug/mL at 12 hours after ingestion are often associated with  toxic reactions.  Performed at [facility], [address], [place], [place] [address]   Salicylate level     Status: Abnormal   Collection Time: 10/27/20  1:21 PM  Result Value Ref Range   Salicylate Lvl <7.0 (L) 7.0 - 30.0 mg/dL    Comment: Performed at [facility], [address], [place], [place] [address]  I-Stat beta hCG blood, ED     Status: None   Collection Time: 10/27/20  1:57 PM  Result Value Ref Range   I-stat hCG, quantitative <5.0 <5 mIU/mL   Comment 3            Comment:   GEST. AGE      CONC.  (mIU/mL)   <=1 WEEK        5 - 50     2 WEEKS       50 - 500     3 WEEKS       100 - 10,000     4 WEEKS     1,000 - 30,000        FEMALE AND NON-PREGNANT FEMALE:     LESS THAN 5 mIU/mL   CBG monitoring, ED     Status: Abnormal   Collection Time: 10/27/20  9:51 PM  Result Value Ref Range   Glucose-Capillary 186 (H) 70 - 99 mg/dL    Comment: Glucose reference range applies only to samples taken after fasting for at least 8 hours.  CBG monitoring, ED     Status: None   Collection Time: 10/28/20  8:29 AM  Result Value Ref Range   Glucose-Capillary 97 70 - 99 mg/dL    Comment: Glucose reference range applies only to samples taken after fasting for at least 8 hours.   Comment 1 Notify RN    Comment 2 Document in Chart   CBG monitoring, ED     Status: Abnormal   Collection Time: 10/28/20  4:55 PM  Result Value Ref Range   Glucose-Capillary 116 (H) 70 - 99  mg/dL    Comment: Glucose reference range applies only to samples taken after fasting for at least 8 hours.  CBG monitoring, ED     Status: Abnormal   Collection Time: 10/29/20  7:59 AM  Result Value Ref Range   Glucose-Capillary 143 (H) 70 - 99 mg/dL    Comment: Glucose reference range applies only to samples taken after fasting for at least 8 hours.    Medications:  Current Facility-Administered Medications  Medication Dose Route Frequency Provider Last Rate Last Admin  . ARIPiprazole (ABILIFY) tablet 5 mg  5 mg Oral Daily Cheryll Cockayne, MD   5 mg at 10/29/20 7693  . hydrALAZINE (APRESOLINE) tablet 100 mg  100 mg Oral TID Cheryll Cockayne, MD   100 mg at 10/29/20 0935  . insulin detemir (LEVEMIR) injection 15 Units  15 Units Subcutaneous BID Cheryll Cockayne, MD   15 Units at 10/29/20 [phone]  . mirtazapine (REMERON) tablet 15 mg  15 mg Oral QHS Caccavale, Sophia, PA-C   15 mg at 10/28/20 2158   Current Outpatient Medications  Medication Sig Dispense Refill  . amLODipine (NORVASC) 10 MG tablet Take 1 tablet (10 mg  total) by mouth daily. 30 tablet 0  . ARIPiprazole (ABILIFY) 5 MG tablet Take 5 mg by mouth daily.    . cloNIDine (CATAPRES) 0.3 MG tablet Take 1 tablet (0.3 mg total) by mouth 2 (two) times daily. 60 tablet 0  . escitalopram (LEXAPRO) 10 MG tablet Take 10 mg by mouth daily.    . hydrALAZINE (APRESOLINE) 100 MG tablet Take 1 tablet (100 mg total) by mouth 3 (three) times daily. 90 tablet 0  . insulin detemir (LEVEMIR) 100 UNIT/ML FlexPen Inject 15 Units into the skin 2 (two) times daily. 15 mL 0  . lisinopril (ZESTRIL) 40 MG tablet Take 1 tablet (40 mg total) by mouth daily. 30 tablet 0  . metoprolol succinate (TOPROL-XL) 50 MG 24 hr tablet Take 50 mg by mouth every morning.    . mirtazapine (REMERON) 15 MG tablet Take 1 tablet (15 mg total) by mouth at bedtime. For Depression 30 tablet 0  . ACCU-CHEK GUIDE test strip 2 (two) times daily. as directed    . Accu-Chek Softclix Lancets  lancets 2 (two) times daily.    . ARIPiprazole (ABILIFY) 15 MG tablet Take 1 tablet (15 mg total) by mouth at bedtime. (Patient not taking: No sig reported) 30 tablet 0  . aspirin EC 81 MG EC tablet Take 1 tablet (81 mg total) by mouth daily. (Patient not taking: No sig reported)    . atorvastatin (LIPITOR) 40 MG tablet Take 1 tablet (40 mg total) by mouth daily at 6 PM. (Patient not taking: No sig reported)    . Blood Glucose Monitoring Suppl (ACCU-CHEK GUIDE) w/Device KIT USE AS DIRECTED TO CHECK BLOOD SUGAR    . cilostazol (PLETAL) 100 MG tablet Take 1 tablet (100 mg total) by mouth 2 (two) times daily. (Patient not taking: No sig reported) 60 tablet 0  . ferrous sulfate 325 (65 FE) MG tablet Take 1 tablet (325 mg total) by mouth daily with breakfast. (Patient not taking: No sig reported) 30 tablet 0  . HYDROcodone-acetaminophen (NORCO/VICODIN) 5-325 MG tablet Take 1 tablet by mouth every 6 (six) hours as needed for severe pain. (Patient not taking: No sig reported) 4 tablet 0  . lidocaine (LIDODERM) 5 % Place 1 patch onto the skin daily. Remove & Discard patch within 12 hours or as directed by MD (Patient not taking: No sig reported) 30 patch 0  . methylPREDNISolone (MEDROL DOSEPAK) 4 MG TBPK tablet Per dose pack instructions (Patient not taking: No sig reported) 21 tablet 0  . metoprolol tartrate (LOPRESSOR) 25 MG tablet Take 0.5 tablets (12.5 mg total) by mouth 2 (two) times daily. (Patient not taking: No sig reported) 60 tablet 0    Musculoskeletal: Strength & Muscle Tone: decreased Gait & Station: Unable to assess Patient leans: N/A  Psychiatric Specialty Exam: Physical Exam Vitals and nursing note reviewed.  Constitutional:      Appearance: She is well-developed.  HENT:     Head: Normocephalic.  Cardiovascular:     Rate and Rhythm: Normal rate.  Pulmonary:     Effort: Pulmonary effort is normal.  Neurological:     Mental Status: She is alert and oriented to person, place, and  time.  Psychiatric:        Attention and Perception: Attention and perception normal.        Mood and Affect: Affect normal. Mood is depressed.        Speech: Speech normal.        Behavior: Behavior normal. Behavior is  cooperative.        Thought Content: Thought content normal.        Cognition and Memory: Cognition and memory normal.        Judgment: Judgment normal.     Review of Systems  Constitutional: Negative.   HENT: Negative.   Eyes: Negative.   Respiratory: Negative.   Cardiovascular: Negative.   Gastrointestinal: Negative.   Genitourinary: Negative.   Skin: Negative.   Neurological: Negative.   Psychiatric/Behavioral: Negative.     Blood pressure (!) 171/92, pulse 100, temperature 98.8 F (37.1 C), temperature source Oral, resp. rate 17, height $RemoveBe'5\' 1"'OyaXeZvQY$  (1.549 m), weight 80 kg, last menstrual period 04/26/2018, SpO2 99 %.Body mass index is 33.32 kg/m.  General Appearance: Casual and Fairly Groomed  Eye Contact:  Good  Speech:  Clear and Coherent and Normal Rate  Volume:  Normal  Mood:  Depressed  Affect:  Appropriate and Congruent  Thought Process:  Coherent, Goal Directed and Descriptions of Associations: Intact  Orientation:  Full (Time, Place, and Person)  Thought Content:  WDL and Logical  Suicidal Thoughts:  No  Homicidal Thoughts:  No  Memory:  Immediate;   Good Recent;   Good Remote;   Good  Judgement:  Good  Insight:  Good  Psychomotor Activity:  Normal  Concentration:  Concentration: Good and Attention Span: Good  Recall:  Good  Fund of Knowledge:  Good  Language:  Good  Akathisia:  No  Handed:  Right  AIMS (if indicated):     Assets:  Communication Skills Desire for Improvement Financial Resources/Insurance Housing Intimacy Leisure Time Resilience Social Support Talents/Skills  ADL's:  Intact  Cognition:  WNL  Sleep:        Treatment Plan Summary: Plan Patient reviewed with Dr. Dwyane Dee.  Follow-up with established outpatient  psychiatry. Intensive outpatient resources offered.  Disposition: No evidence of imminent risk to self or others at present.   Patient does not meet criteria for psychiatric inpatient admission. Supportive therapy provided about ongoing stressors. Discussed crisis plan, support from social network, calling 911, coming to the Emergency Department, and calling Suicide Hotline.  This service was provided via telemedicine using a 2-way, interactive audio and video technology.  Names of all persons participating in this telemedicine service and their role in this encounter. Name: Jia Dottavio Role: Patient  Name: Letitia Libra Role: FNP  Name: Dr. Dwyane Dee Role: Psychiatrist    Emmaline Kluver, FNP 10/29/2020 10:35 AM

## 2020-10-29 NOTE — ED Notes (Signed)
Pt discharged home. Discharged instructions read to pt who verbalized understanding. All belongings returned to pt including her telephone. . Denies SI/HI, is not delusional and not responding to internal stimuli. Escorted pt to the Main exit where staff waited with her for [facility] to pick her up.

## 2020-10-29 NOTE — Discharge Instructions (Signed)
For your behavioral health needs, you are advised to continue treatment with Leanord Hawking, MD:       Leanord Hawking, MD      [facility]      [address], [place], [place] [address]      [phone]  As an alternative, the [facility] offers a Partial Hospitalization Program (PHP).  This program meets Monday - Friday from 9:00 am - 1:00 pm.  Due to Covid-19 the program is currently virtual.  It offers more structure and regular visits than routine outpatient treatment, and it includes both therapy and psychiatry.  For more information about this program contact Radonna Ricker, CSW at the number indicated below:       [facility]. [address] [employer] [address]. [place], [place] [address]      Contact person: Radonna Ricker, [place]      [phone]

## 2020-10-29 NOTE — BH Assessment (Signed)
[place] Assessment Progress Note  Per Letitia Libra, NP, this voluntary pt does not require psychiatric hospitalization at this time.  Pt is psychiatrically cleared.  This Probation officer spoke to pt.  She currently sees Leanord Hawking, MD for routine outpatient psychiatry.  I discussed the Partial Hospitalization Program (PHP) at the [facility] with her.  Due to Covid-19, this program is currently virtual.  Unfortunately, pt only has a phone that can be used for real time audio communication, which would be insufficient for this program.  Discharge instructions advise pt to continue treatment with Leanord Hawking, MD, but also offer information about PHP.  EDP Thamas Jaegers, MD and pt's nurse, Diane, have been notified.  Jalene Mullet, [place] Triage Specialist [phone]

## 2020-10-29 NOTE — ED Notes (Signed)
Notified Ricquita, SW, that pt needs taxi voucher or some other transport home.  She cannot go in the bus.

## 2020-10-29 NOTE — ED Notes (Signed)
Informed Dr. Almyra Free that pt reports chest pain 10/10, pressure feeling. Also feels SOB (sats 98% on RA). EKG completed and given to Dr. Almyra Free. CBG = 180.

## 2020-12-27 ENCOUNTER — Emergency Department (HOSPITAL_COMMUNITY): Payer: Medicare Other

## 2020-12-27 ENCOUNTER — Inpatient Hospital Stay (HOSPITAL_COMMUNITY)
Admission: EM | Admit: 2020-12-27 | Discharge: 2021-01-03 | DRG: 064 | Disposition: A | Discharge status: Skilled Nursing Facility | Payer: Medicare Other | Attending: Internal Medicine | Admitting: Internal Medicine

## 2020-12-27 DIAGNOSIS — I351 Nonrheumatic aortic (valve) insufficiency: Secondary | ICD-10-CM | POA: Diagnosis present

## 2020-12-27 DIAGNOSIS — J690 Pneumonitis due to inhalation of food and vomit: Secondary | ICD-10-CM | POA: Diagnosis present

## 2020-12-27 DIAGNOSIS — I129 Hypertensive chronic kidney disease with stage 1 through stage 4 chronic kidney disease, or unspecified chronic kidney disease: Secondary | ICD-10-CM | POA: Diagnosis present

## 2020-12-27 DIAGNOSIS — Z794 Long term (current) use of insulin: Secondary | ICD-10-CM

## 2020-12-27 DIAGNOSIS — G8194 Hemiplegia, unspecified affecting left nondominant side: Secondary | ICD-10-CM | POA: Diagnosis present

## 2020-12-27 DIAGNOSIS — N182 Chronic kidney disease, stage 2 (mild): Secondary | ICD-10-CM | POA: Diagnosis present

## 2020-12-27 DIAGNOSIS — Z9114 Patient's other noncompliance with medication regimen: Secondary | ICD-10-CM | POA: Diagnosis not present

## 2020-12-27 DIAGNOSIS — F1410 Cocaine abuse, uncomplicated: Secondary | ICD-10-CM | POA: Diagnosis present

## 2020-12-27 DIAGNOSIS — E669 Obesity, unspecified: Secondary | ICD-10-CM | POA: Diagnosis present

## 2020-12-27 DIAGNOSIS — I629 Nontraumatic intracranial hemorrhage, unspecified: Secondary | ICD-10-CM | POA: Diagnosis present

## 2020-12-27 DIAGNOSIS — E1151 Type 2 diabetes mellitus with diabetic peripheral angiopathy without gangrene: Secondary | ICD-10-CM | POA: Diagnosis present

## 2020-12-27 DIAGNOSIS — G4730 Sleep apnea, unspecified: Secondary | ICD-10-CM | POA: Diagnosis present

## 2020-12-27 DIAGNOSIS — K220 Achalasia of cardia: Secondary | ICD-10-CM | POA: Diagnosis present

## 2020-12-27 DIAGNOSIS — Z86711 Personal history of pulmonary embolism: Secondary | ICD-10-CM

## 2020-12-27 DIAGNOSIS — Z79899 Other long term (current) drug therapy: Secondary | ICD-10-CM

## 2020-12-27 DIAGNOSIS — R471 Dysarthria and anarthria: Secondary | ICD-10-CM | POA: Diagnosis present

## 2020-12-27 DIAGNOSIS — E7800 Pure hypercholesterolemia, unspecified: Secondary | ICD-10-CM | POA: Diagnosis present

## 2020-12-27 DIAGNOSIS — Z7982 Long term (current) use of aspirin: Secondary | ICD-10-CM

## 2020-12-27 DIAGNOSIS — E876 Hypokalemia: Secondary | ICD-10-CM | POA: Diagnosis present

## 2020-12-27 DIAGNOSIS — I639 Cerebral infarction, unspecified: Secondary | ICD-10-CM | POA: Diagnosis present

## 2020-12-27 DIAGNOSIS — Z823 Family history of stroke: Secondary | ICD-10-CM

## 2020-12-27 DIAGNOSIS — R29703 NIHSS score 3: Secondary | ICD-10-CM | POA: Diagnosis present

## 2020-12-27 DIAGNOSIS — Z20822 Contact with and (suspected) exposure to covid-19: Secondary | ICD-10-CM | POA: Diagnosis present

## 2020-12-27 DIAGNOSIS — Z5900 Homelessness unspecified: Secondary | ICD-10-CM

## 2020-12-27 DIAGNOSIS — R918 Other nonspecific abnormal finding of lung field: Secondary | ICD-10-CM | POA: Diagnosis not present

## 2020-12-27 DIAGNOSIS — R911 Solitary pulmonary nodule: Secondary | ICD-10-CM

## 2020-12-27 DIAGNOSIS — R1310 Dysphagia, unspecified: Secondary | ICD-10-CM

## 2020-12-27 DIAGNOSIS — I6389 Other cerebral infarction: Secondary | ICD-10-CM | POA: Diagnosis not present

## 2020-12-27 DIAGNOSIS — Z885 Allergy status to narcotic agent status: Secondary | ICD-10-CM

## 2020-12-27 DIAGNOSIS — I358 Other nonrheumatic aortic valve disorders: Secondary | ICD-10-CM | POA: Diagnosis present

## 2020-12-27 DIAGNOSIS — Z888 Allergy status to other drugs, medicaments and biological substances status: Secondary | ICD-10-CM

## 2020-12-27 DIAGNOSIS — F32A Depression, unspecified: Secondary | ICD-10-CM | POA: Diagnosis present

## 2020-12-27 DIAGNOSIS — Z6835 Body mass index (BMI) 35.0-35.9, adult: Secondary | ICD-10-CM

## 2020-12-27 DIAGNOSIS — E1122 Type 2 diabetes mellitus with diabetic chronic kidney disease: Secondary | ICD-10-CM | POA: Diagnosis present

## 2020-12-27 DIAGNOSIS — Z87891 Personal history of nicotine dependence: Secondary | ICD-10-CM

## 2020-12-27 DIAGNOSIS — I6930 Unspecified sequelae of cerebral infarction: Secondary | ICD-10-CM

## 2020-12-27 DIAGNOSIS — F1210 Cannabis abuse, uncomplicated: Secondary | ICD-10-CM | POA: Diagnosis present

## 2020-12-27 DIAGNOSIS — J449 Chronic obstructive pulmonary disease, unspecified: Secondary | ICD-10-CM | POA: Diagnosis present

## 2020-12-27 DIAGNOSIS — R1312 Dysphagia, oropharyngeal phase: Secondary | ICD-10-CM | POA: Diagnosis not present

## 2020-12-27 DIAGNOSIS — Z8249 Family history of ischemic heart disease and other diseases of the circulatory system: Secondary | ICD-10-CM

## 2020-12-27 DIAGNOSIS — E1159 Type 2 diabetes mellitus with other circulatory complications: Secondary | ICD-10-CM

## 2020-12-27 DIAGNOSIS — I10 Essential (primary) hypertension: Secondary | ICD-10-CM | POA: Diagnosis not present

## 2020-12-27 HISTORY — DX: Cerebral infarction, unspecified: I63.9

## 2020-12-27 LAB — DIFFERENTIAL
Abs Immature Granulocytes: 0.02 10*3/uL (ref 0.00–0.07)
Basophils Absolute: 0.1 10*3/uL (ref 0.0–0.1)
Basophils Relative: 1 %
Eosinophils Absolute: 0.2 10*3/uL (ref 0.0–0.5)
Eosinophils Relative: 2 %
Immature Granulocytes: 0 %
Lymphocytes Relative: 40 %
Lymphs Abs: 2.8 10*3/uL (ref 0.7–4.0)
Monocytes Absolute: 0.7 10*3/uL (ref 0.1–1.0)
Monocytes Relative: 10 %
Neutro Abs: 3.2 10*3/uL (ref 1.7–7.7)
Neutrophils Relative %: 47 %

## 2020-12-27 LAB — URINALYSIS, ROUTINE W REFLEX MICROSCOPIC
Bacteria, UA: NONE SEEN
Bilirubin Urine: NEGATIVE
Glucose, UA: 50 mg/dL — AB
Hgb urine dipstick: NEGATIVE
Ketones, ur: NEGATIVE mg/dL
Leukocytes,Ua: NEGATIVE
Nitrite: NEGATIVE
Protein, ur: 30 mg/dL — AB
Specific Gravity, Urine: 1.023 (ref 1.005–1.030)
pH: 8 (ref 5.0–8.0)

## 2020-12-27 LAB — COMPREHENSIVE METABOLIC PANEL
ALT: 8 U/L (ref 0–44)
AST: 11 U/L — ABNORMAL LOW (ref 15–41)
Albumin: 3.5 g/dL (ref 3.5–5.0)
Alkaline Phosphatase: 66 U/L (ref 38–126)
Anion gap: 9 (ref 5–15)
BUN: 11 mg/dL (ref 6–20)
CO2: 25 mmol/L (ref 22–32)
Calcium: 9.4 mg/dL (ref 8.9–10.3)
Chloride: 104 mmol/L (ref 98–111)
Creatinine, Ser: 1.04 mg/dL — ABNORMAL HIGH (ref 0.44–1.00)
GFR, Estimated: >60 mL/min (ref >60)
Glucose, Bld: 143 mg/dL — ABNORMAL HIGH (ref 70–99)
Potassium: 3.5 mmol/L (ref 3.5–5.1)
Sodium: 138 mmol/L (ref 135–145)
Total Bilirubin: 0.5 mg/dL (ref 0.3–1.2)
Total Protein: 8.1 g/dL (ref 6.5–8.1)

## 2020-12-27 LAB — CBC
HCT: 39.8 % (ref 36.0–46.0)
Hemoglobin: 12.5 g/dL (ref 12.0–15.0)
MCH: 23.2 pg — ABNORMAL LOW (ref 26.0–34.0)
MCHC: 31.4 g/dL (ref 30.0–36.0)
MCV: 73.8 fL — ABNORMAL LOW (ref 80.0–100.0)
Platelets: 264 10*3/uL (ref 150–400)
RBC: 5.39 MIL/uL — ABNORMAL HIGH (ref 3.87–5.11)
RDW: 16.2 % — ABNORMAL HIGH (ref 11.5–15.5)
WBC: 6.9 10*3/uL (ref 4.0–10.5)
nRBC: 0 % (ref 0.0–0.2)

## 2020-12-27 LAB — I-STAT CHEM 8, ED
BUN: 12 mg/dL (ref 6–20)
Calcium, Ion: 1.16 mmol/L (ref 1.15–1.40)
Chloride: 103 mmol/L (ref 98–111)
Creatinine, Ser: 0.9 mg/dL (ref 0.44–1.00)
Glucose, Bld: 143 mg/dL — ABNORMAL HIGH (ref 70–99)
HCT: 41 % (ref 36.0–46.0)
Hemoglobin: 13.9 g/dL (ref 12.0–15.0)
Potassium: 3.5 mmol/L (ref 3.5–5.1)
Sodium: 140 mmol/L (ref 135–145)
TCO2: 28 mmol/L (ref 22–32)

## 2020-12-27 LAB — RESP PANEL BY RT-PCR (FLU A&B, COVID) ARPGX2
Influenza A by PCR: NEGATIVE
Influenza B by PCR: NEGATIVE
SARS Coronavirus 2 by RT PCR: NEGATIVE

## 2020-12-27 LAB — APTT: aPTT: 33 seconds (ref 24–36)

## 2020-12-27 LAB — CBG MONITORING, ED
Glucose-Capillary: 112 mg/dL — ABNORMAL HIGH (ref 70–99)
Glucose-Capillary: 88 mg/dL (ref 70–99)

## 2020-12-27 LAB — RAPID URINE DRUG SCREEN, HOSP PERFORMED
Amphetamines: NOT DETECTED
Barbiturates: NOT DETECTED
Benzodiazepines: NOT DETECTED
Cocaine: NOT DETECTED
Opiates: NOT DETECTED
Tetrahydrocannabinol: POSITIVE — AB

## 2020-12-27 LAB — PROTIME-INR
INR: 1 (ref 0.8–1.2)
Prothrombin Time: 13.6 seconds (ref 11.4–15.2)

## 2020-12-27 LAB — ETHANOL: Alcohol, Ethyl (B): <10 mg/dL (ref <10)

## 2020-12-27 LAB — I-STAT BETA HCG BLOOD, ED (MC, WL, AP ONLY): I-stat hCG, quantitative: <5 m[IU]/mL (ref <5)

## 2020-12-27 MED ORDER — HYDRALAZINE HCL 20 MG/ML IJ SOLN
10.0000 mg | Freq: Once | INTRAMUSCULAR | Status: AC
  Administered 2020-12-27: 10 mg via INTRAVENOUS
  Filled 2020-12-27: qty 1

## 2020-12-27 MED ORDER — MIRTAZAPINE 15 MG PO TABS
15.0000 mg | ORAL_TABLET | Freq: Every day | ORAL | Status: DC
  Administered 2020-12-28 – 2021-01-02 (×6): 15 mg via ORAL
  Filled 2020-12-27 (×7): qty 1

## 2020-12-27 MED ORDER — ACETAMINOPHEN 325 MG PO TABS: 650.0000 mg | ORAL_TABLET | Freq: Once | ORAL | Status: DC

## 2020-12-27 MED ORDER — IOHEXOL 350 MG/ML SOLN
75.0000 mL | Freq: Once | INTRAVENOUS | Status: AC | PRN
  Administered 2020-12-27: 75 mL via INTRAVENOUS

## 2020-12-27 MED ORDER — LABETALOL HCL 5 MG/ML IV SOLN
5.0000 mg | INTRAVENOUS | Status: DC | PRN
Start: 2020-12-27 — End: 2021-01-03
  Administered 2020-12-27 – 2020-12-29 (×2): 5 mg via INTRAVENOUS
  Administered 2020-12-31: 10 mg via INTRAVENOUS
  Filled 2020-12-27 (×2): qty 4

## 2020-12-27 MED ORDER — STROKE: EARLY STAGES OF RECOVERY BOOK
Freq: Once | Status: AC
  Filled 2020-12-27: qty 1

## 2020-12-27 MED ORDER — ARIPIPRAZOLE 10 MG PO TABS
5.0000 mg | ORAL_TABLET | Freq: Every day | ORAL | Status: DC
  Administered 2020-12-28 – 2021-01-03 (×7): 5 mg via ORAL
  Filled 2020-12-27 (×8): qty 1

## 2020-12-27 MED ORDER — ATORVASTATIN CALCIUM 40 MG PO TABS: 40.0000 mg | ORAL_TABLET | Freq: Every day | ORAL | Status: DC

## 2020-12-27 MED ORDER — ASPIRIN 81 MG PO CHEW
81.0000 mg | CHEWABLE_TABLET | Freq: Every day | ORAL | Status: DC
  Administered 2020-12-28 – 2021-01-03 (×7): 81 mg via ORAL
  Filled 2020-12-27 (×8): qty 1

## 2020-12-27 MED ORDER — FENTANYL CITRATE (PF) 100 MCG/2ML IJ SOLN: 12.5000 ug | Freq: Once | INTRAMUSCULAR | Status: DC

## 2020-12-27 MED ORDER — INSULIN ASPART 100 UNIT/ML IJ SOLN
0.0000 [IU] | INTRAMUSCULAR | Status: DC
  Administered 2020-12-28: 2 [IU] via SUBCUTANEOUS
  Administered 2020-12-28 – 2020-12-29 (×3): 1 [IU] via SUBCUTANEOUS

## 2020-12-27 MED ORDER — HYDRALAZINE HCL 20 MG/ML IJ SOLN: 5.0000 mg | INTRAMUSCULAR | Status: DC | PRN

## 2020-12-27 MED ORDER — ACETAMINOPHEN 650 MG RE SUPP: 650.0000 mg | RECTAL | Status: DC | PRN

## 2020-12-27 MED ORDER — ACETAMINOPHEN 325 MG PO TABS
650.0000 mg | ORAL_TABLET | ORAL | Status: DC | PRN
  Administered 2020-12-28: 650 mg via ORAL
  Filled 2020-12-27 (×2): qty 2

## 2020-12-27 MED ORDER — ACETAMINOPHEN 160 MG/5ML PO SOLN: 650.0000 mg | ORAL | Status: DC | PRN

## 2020-12-27 NOTE — H&P (Signed)
History and Physical    Diamond Rice NLG:[id number] DOB: 1970/12/04 DOA: 12/27/2020  PCP: [name], MD  Patient coming from: Home  I have personally briefly reviewed patient's old medical records in [place]  Chief Complaint: Slurred speech  HPI: Diamond Rice is a 50 y.o. female with medical history significant of DM2, HTN, HLD, smoking, prior cocaine abuse, PE (no longer on anticoagulation), medication nonadhering.  Pt with h/o 4 prior strokes: 3 ischemic, 1 hemorrhagic.  Pt has chronic L sided deficits from the hemorrhagic stroke.  Most recent stroke was ischemic in April of 2021.  Today pt presents to ED with acute worsening of B leg pain and dysarthria.  LKW was 10am today.  Symptoms are constant, moderate, persistent.  Nothing makes symptoms better or worse.  EMS called, pt noted to be very hypertensive.  No fevers, chills.   ED Course: BPs running 230s / 130s.  CT head neg  MRI brain confirms a small acute ischemic stroke.   Review of Systems: As per HPI, otherwise all review of systems negative.  Past Medical History:  Diagnosis Date  . Anxiety   . Chronic lower back pain 04/17/2012   "just got over some; catched when I walked"  . COPD (chronic obstructive pulmonary disease) ([place])   . Critical lower limb ischemia ([place]) 05/27/2018  . Depression   . Headache(784.0) 04/17/2012   "~ qod; lately waking up in am w/one"  . High cholesterol   . Hypertension   . Migraines 04/17/2012  . Obesity   . Sleep apnea    CPAP  . Stroke ([place])   . Type II diabetes mellitus ([place]) 08/28/2002    Past Surgical History:  Procedure Laterality Date  . ABDOMINAL AORTOGRAM W/LOWER EXTREMITY Left 05/27/2018   Procedure: ABDOMINAL AORTOGRAM W/LOWER EXTREMITY Runoff and Possible Intervention;  Surgeon: Waynetta Sandy, MD;  Location: [place] [facility];  Service: Cardiovascular;  Laterality: Left;  . APPLICATION OF WOUND VAC Left 06/06/2018   Procedure:  APPLICATION OF WOUND VAC;  Surgeon: Waynetta Sandy, MD;  Location: [place];  Service: Vascular;  Laterality: Left;  . BIOPSY  04/04/2018   Procedure: BIOPSY;  Surgeon: [name], MD;  Location: [facility] ENDOSCOPY;  Service: Endoscopy;;  . BIOPSY  10/27/2020   Procedure: BIOPSY;  Surgeon: Arta Silence, MD;  Location: [facility] ENDOSCOPY;  Service: Endoscopy;;  . CARDIAC CATHETERIZATION  ~ 2011  . COLONOSCOPY N/A 04/04/2018   Procedure: COLONOSCOPY;  Surgeon: [name], MD;  Location: [facility] ENDOSCOPY;  Service: Endoscopy;  Laterality: N/A;  . COLONOSCOPY WITH PROPOFOL N/A 10/27/2020   Procedure: COLONOSCOPY WITH PROPOFOL;  Surgeon: Arta Silence, MD;  Location: [facility] ENDOSCOPY;  Service: Endoscopy;  Laterality: N/A;  . ESOPHAGOGASTRODUODENOSCOPY (EGD) WITH PROPOFOL N/A 10/27/2020   Procedure: ESOPHAGOGASTRODUODENOSCOPY (EGD) WITH PROPOFOL  WITH POSSIBLE DIL;  Surgeon: Arta Silence, MD;  Location: [facility] ENDOSCOPY;  Service: Endoscopy;  Laterality: N/A;  . LOWER EXTREMITY ANGIOGRAM Left 05/27/2018  . PERIPHERAL VASCULAR INTERVENTION Left 05/27/2018   Procedure: PERIPHERAL VASCULAR INTERVENTION;  Surgeon: Waynetta Sandy, MD;  Location: [place] [facility];  Service: Cardiovascular;  Laterality: Left;  . POLYPECTOMY  04/04/2018   Procedure: POLYPECTOMY;  Surgeon: [name], MD;  Location: [facility] ENDOSCOPY;  Service: Endoscopy;;  . POLYPECTOMY  10/27/2020   Procedure: POLYPECTOMY;  Surgeon: Arta Silence, MD;  Location: [facility] ENDOSCOPY;  Service: Endoscopy;;  . TRANSMETATARSAL AMPUTATION Left 05/28/2018   Procedure: AMPUTATION TOES THREE, FOUR AND FIVE on left foot;  Surgeon: Waynetta Sandy, MD;  Location: [place];  Service: Vascular;  Laterality: Left;  . WOUND DEBRIDEMENT Left 06/06/2018   Procedure: DEBRIDEMENT WOUND LEFT FOOT;  Surgeon: Waynetta Sandy, MD;  Location: [place];  Service: Vascular;  Laterality: Left;     reports that she has quit smoking. Her smoking use included  cigarettes. She has a 7.00 pack-year smoking history. She has never used smokeless tobacco. She reports current drug use. Drugs: Marijuana and "Crack" cocaine. She reports that she does not drink alcohol.  Allergies  Allergen Reactions  . Morphine And Related Hives  . Metformin Diarrhea and Other (See Comments)    "Allergic," per [facility]    Family History  Problem Relation Age of Onset  . Stroke Brother 65  . Stroke Brother 78  . Heart attack Mother 81  . Stroke Father 47     Prior to Admission medications   Medication Sig Start Date End Date Taking? Authorizing Provider  ACCU-CHEK GUIDE test strip 2 (two) times daily. as directed 02/06/20   [provider]  Accu-Chek Softclix Lancets lancets 2 (two) times daily. 02/06/20   [provider]  amLODipine (NORVASC) 10 MG tablet Take 1 tablet (10 mg total) by mouth daily. 01/22/20   Royal Hawthorn, NP  ARIPiprazole (ABILIFY) 15 MG tablet Take 1 tablet (15 mg total) by mouth at bedtime. Patient not taking: No sig reported 01/22/20   Royal Hawthorn, NP  ARIPiprazole (ABILIFY) 5 MG tablet Take 5 mg by mouth daily. 08/28/20   [provider]  aspirin EC 81 MG EC tablet Take 1 tablet (81 mg total) by mouth daily. Patient not taking: No sig reported 09/21/18   Donzetta Starch, NP  atorvastatin (LIPITOR) 40 MG tablet Take 1 tablet (40 mg total) by mouth daily at 6 PM. Patient not taking: No sig reported 01/22/20   Royal Hawthorn, NP  Blood Glucose Monitoring Suppl (ACCU-CHEK GUIDE) w/Device KIT USE AS DIRECTED TO CHECK BLOOD SUGAR 02/06/20   [provider]  cilostazol (PLETAL) 100 MG tablet Take 1 tablet (100 mg total) by mouth 2 (two) times daily. Patient not taking: No sig reported 01/22/20   Royal Hawthorn, NP  cloNIDine (CATAPRES) 0.3 MG tablet Take 1 tablet (0.3 mg total) by mouth 2 (two) times daily. 01/22/20   Royal Hawthorn, NP  escitalopram (LEXAPRO) 10 MG tablet Take 10 mg by mouth daily. 04/29/20   [provider]  ferrous sulfate 325 (65 FE) MG tablet Take 1 tablet (325 mg total) by mouth daily with breakfast. Patient not taking: No sig reported 08/26/18   Medina-Vargas, Monina C, NP  hydrALAZINE (APRESOLINE) 100 MG tablet Take 1 tablet (100 mg total) by mouth 3 (three) times daily. 01/22/20   Royal Hawthorn, NP  HYDROcodone-acetaminophen (NORCO/VICODIN) 5-325 MG tablet Take 1 tablet by mouth every 6 (six) hours as needed for severe pain. Patient not taking: No sig reported 02/18/20   Alfredia Client, PA-C  insulin detemir (LEVEMIR) 100 UNIT/ML FlexPen Inject 15 Units into the skin 2 (two) times daily. 01/22/20   Royal Hawthorn, NP  lidocaine (LIDODERM) 5 % Place 1 patch onto the skin daily. Remove & Discard patch within 12 hours or as directed by MD Patient not taking: No sig reported 02/18/20   Alfredia Client, PA-C  lisinopril (ZESTRIL) 40 MG tablet Take 1 tablet (40 mg total) by mouth daily. 01/22/20   Royal Hawthorn, NP  methylPREDNISolone (MEDROL DOSEPAK) 4 MG TBPK tablet Per dose pack instructions  Patient not taking: No sig reported 02/18/20   Alfredia Client, PA-C  metoprolol succinate (TOPROL-XL) 50 MG 24 hr tablet Take 50 mg by mouth every morning. 08/02/20   [provider]  metoprolol tartrate (LOPRESSOR) 25 MG tablet Take 0.5 tablets (12.5 mg total) by mouth 2 (two) times daily. Patient not taking: No sig reported 01/22/20   Royal Hawthorn, NP  mirtazapine (REMERON) 15 MG tablet Take 1 tablet (15 mg total) by mouth at bedtime. For Depression 01/22/20   Royal Hawthorn, NP    Physical Exam: Vitals:   12/27/20 1825 12/27/20 1854 12/27/20 1930 12/27/20 2038  BP:  (!) 222/114 (!) 226/138 (!) 184/120  Pulse:  74 84 78  Resp:  _0 Temp: 99 F (37.2 C)     TempSrc: Oral     SpO2:  100% 100% 100%    Constitutional: NAD, calm, comfortable Eyes: PERRL, lids and conjunctivae normal ENMT: Mucous membranes are moist. Posterior pharynx clear of any exudate or lesions.Normal  dentition.  Neck: normal, supple, no masses, no thyromegaly Respiratory: clear to auscultation bilaterally, no wheezing, no crackles. Normal respiratory effort. No accessory muscle use.  Cardiovascular: Regular rate and rhythm, no murmurs / rubs / gallops. No extremity edema. 2+ pedal pulses. No carotid bruits.  Abdomen: no tenderness, no masses palpated. No hepatosplenomegaly. Bowel sounds positive.  Musculoskeletal: no clubbing / cyanosis. No joint deformity upper and lower extremities. Good ROM, no contractures. Normal muscle tone.  Skin: no rashes, lesions, ulcers. No induration Neurologic: Mild left facial droop, some expressive aphasia Psychiatric: Normal judgment and insight. Alert and oriented x 3. Normal mood.    Labs on Admission: I have personally reviewed following labs and imaging studies  CBC: Recent Labs  Lab 12/27/20 1749 12/27/20 1755  WBC 6.9  --   NEUTROABS 3.2  --   HGB 12.5 13.9  HCT 39.8 41.0  MCV 73.8*  --   PLT 264  --    Basic Metabolic Panel: Recent Labs  Lab 12/27/20 1749 12/27/20 1755  NA 138 140  K 3.5 3.5  CL 104 103  CO2 25  --   GLUCOSE 143* 143*  BUN 11 12  CREATININE 1.04* 0.90  CALCIUM 9.4  --    GFR: CrCl cannot be calculated (Unknown ideal weight.). Liver Function Tests: Recent Labs  Lab 12/27/20 1749  AST 11*  ALT 8  ALKPHOS 66  BILITOT 0.5  PROT 8.1  ALBUMIN 3.5   No results for input(s): LIPASE, AMYLASE in the last 168 hours. No results for input(s): AMMONIA in the last 168 hours. Coagulation Profile: Recent Labs  Lab 12/27/20 1749  INR 1.0   Cardiac Enzymes: No results for input(s): CKTOTAL, CKMB, CKMBINDEX, TROPONINI in the last 168 hours. BNP (last 3 results) No results for input(s): PROBNP in the last 8760 hours. HbA1C: No results for input(s): HGBA1C in the last 72 hours. CBG: No results for input(s): GLUCAP in the last 168 hours. Lipid Profile: No results for input(s): CHOL, HDL, LDLCALC, TRIG, CHOLHDL,  LDLDIRECT in the last 72 hours. Thyroid Function Tests: No results for input(s): TSH, T4TOTAL, FREET4, T3FREE, THYROIDAB in the last 72 hours. Anemia Panel: No results for input(s): VITAMINB12, FOLATE, FERRITIN, TIBC, IRON, RETICCTPCT in the last 72 hours. Urine analysis:    Component Value Date/Time   COLORURINE STRAW (A) 12/27/2020 1930   APPEARANCEUR CLEAR 12/27/2020 1930   LABSPEC 1.023 12/27/2020 1930   PHURINE 8.0 12/27/2020 1930   GLUCOSEU 50 (A)  12/27/2020 1930   HGBUR NEGATIVE 12/27/2020 1930   BILIRUBINUR NEGATIVE 12/27/2020 1930   KETONESUR NEGATIVE 12/27/2020 1930   PROTEINUR 30 (A) 12/27/2020 1930   UROBILINOGEN 0.2 08/28/2014 1143   NITRITE NEGATIVE 12/27/2020 1930   LEUKOCYTESUR NEGATIVE 12/27/2020 1930    Radiological Exams on Admission: MR BRAIN WO CONTRAST  Result Date: 12/27/2020 CLINICAL DATA:  Acute stroke presentation with slurred speech. EXAM: MRI HEAD WITHOUT CONTRAST TECHNIQUE: Multiplanar, multiecho pulse sequences of the brain and surrounding structures were obtained without intravenous contrast. COMPARISON:  CT studies earlier same day.  MRI 12/06/2019. FINDINGS: Brain: Diffusion imaging shows a 4-5 mm acute infarction within the left upper centrum semiovale region. No other acute infarction. Chronic small-vessel ischemic changes affect pons. Old small vessel cerebellar infarctions. Old small vessel infarctions of the thalami and basal ganglia. Hemosiderin deposition in the right basal ganglia and radiating white matter tracts. Chronic small-vessel ischemic changes of the hemispheric white matter. No large vessel territory infarction. No mass lesion, acute hemorrhage, hydrocephalus or extra-axial collection. Vascular: Major vessels at the base of the brain show flow. Skull and upper cervical spine: Negative Sinuses/Orbits: Clear/normal Other: None IMPRESSION: 4-5 mm acute infarction of the left hemispheric white matter in the upper parietal centrum semiovale  region. No large or cortical infarction. Extensive chronic ischemic changes elsewhere throughout the brain as outlined above. Electronically Signed   By: Nelson Chimes M.D.   On: 12/27/2020 20:50   DG Chest Port 1 View  Result Date: 12/27/2020 CLINICAL DATA:  Altered mental status, indistinct focal opacity on neck CT angiogram EXAM: PORTABLE CHEST 1 VIEW COMPARISON:  10/27/2020 chest radiograph. FINDINGS: Stable cardiomediastinal silhouette with mild cardiomegaly. No pneumothorax. No pleural effusion. Indistinct faint opacity in the apical right lung. Otherwise clear lungs with no overt pulmonary edema. IMPRESSION: Stable mild cardiomegaly without overt pulmonary edema. Indistinct faint opacity in the apical right lung, correlating to the site of mixed density opacity on the neck CT angiogram from earlier today. Short-term follow-up dedicated chest CT recommended when clinically feasible. Electronically Signed   By: Ilona Sorrel M.D.   On: 12/27/2020 19:11   CT HEAD CODE STROKE WO CONTRAST  Result Date: 12/27/2020 CLINICAL DATA:  Code stroke. Acute presentation with slurred speech. EXAM: CT HEAD WITHOUT CONTRAST TECHNIQUE: Contiguous axial images were obtained from the base of the skull through the vertex without intravenous contrast. COMPARISON:  MRI 12/06/2019.  CT 12/06/2019 FINDINGS: Brain: No abnormality seen affecting the brainstem or cerebellum. 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 Chronic small-vessel ischemic changes are present throughout the hemispheric white matter. This appears progressive since last year. No definitely acute stroke. One could not rule out the possibility of a recent small vessel infarction. No sign of large vessel or cortical infarction Vascular: There is atherosclerotic calcification of the major vessels at the base of the brain. Skull: Negative Sinuses/Orbits: Clear/normal Other: None ASPECTS ([place] Stroke Program Early CT Score) - Ganglionic level  infarction (caudate, lentiform nuclei, internal capsule, insula, M1-M3 cortex): 7 - Supraganglionic infarction (M4-M6 cortex): 3 Total score (0-10 with 10 being normal): 10 IMPRESSION: 1. No sign of acute large vessel infarction. Chronic small-vessel ischemic changes throughout the brain, somewhat progressive since April of 2021. Cannot rule out the possibility of a more recent small vessel infarction. 2. ASPECTS is 10 3. These results were communicated to Dr. Cheral Marker at 6:04 pmon 5/2/2022by text page via the [facility] messaging system. Electronically Signed   By: Jan Fireman.D.  On: 12/27/2020 18:05   CT ANGIO HEAD CODE STROKE  Result Date: 12/27/2020 CLINICAL DATA:  Acute stroke presentation with slurred speech. EXAM: CT ANGIOGRAPHY HEAD AND NECK TECHNIQUE: Multidetector CT imaging of the head and neck was performed using the standard protocol during bolus administration of intravenous contrast. Multiplanar CT image reconstructions and MIPs were obtained to evaluate the vascular anatomy. Carotid stenosis measurements (when applicable) are obtained utilizing NASCET criteria, using the distal internal carotid diameter as the denominator. CONTRAST:  94m OMNIPAQUE IOHEXOL 350 MG/ML SOLN COMPARISON:  Head CT same day FINDINGS: CTA NECK FINDINGS Aortic arch: Aortic atherosclerosis. Branching pattern is normal without origin stenosis. Right carotid system: Common carotid artery widely patent to the bifurcation. Soft and calcified plaque at the carotid bifurcation and ICA bulb. No stenosis beyond the diameter of the more distal cervical ICA. Left carotid system: Common carotid artery widely patent to the bifurcation. Mild soft plaque at the carotid bifurcation and ICA bulb but no stenosis beyond the diameter of the more distal cervical ICA. Vertebral arteries: Both vertebral artery origins are widely patent. Both vertebral arteries appear normal through the cervical region to the foramen magnum. Skeleton: Ordinary  cervical spondylosis. Other neck: No mass or lymphadenopathy. Dilated fluid and air-filled esophagus. Upper chest: Abnormal density in the lateral aspect of the right upper lobe with solid and sub solid components measuring approximately 2.8 cm. This has a worrisome appearance and could possibly be neoplastic or infectious. Review of the MIP images confirms the above findings CTA HEAD FINDINGS Anterior circulation: Both internal carotid arteries are patent through the skull base and siphon regions. Siphon atherosclerotic calcification but without stenosis greater than 30%. The anterior and middle cerebral vessels are patent. No large or medium vessel occlusion is seen. Distal vessel narrowing and atherosclerotic irregularity. Posterior circulation: Both vertebral arteries are patent to the basilar. The basilar is a small vessel. Both posterior cerebral arteries receive most of there supply from the anterior circulation. Distal vessel narrowing and irregularity within the PCA branches. Venous sinuses: Patent and normal. Anatomic variants: None significant. Review of the MIP images confirms the above findings IMPRESSION: No large or medium vessel occlusion. Atherosclerotic plaque at both carotid bifurcations but without stenosis. Intracranial distal branch vessel irregularity. Dilated fluid and air-filled esophagus. 2.8 cm solid and sub solid lesion in the lateral aspect of the right upper lobe. This could be pneumonia or, more concerning, a neoplastic lesion. Complete chest CT suggested when able. This may require further workup. Electronically Signed   By: MNelson ChimesM.D.   On: 12/27/2020 18:27   CT ANGIO NECK CODE STROKE  Result Date: 12/27/2020 CLINICAL DATA:  Acute stroke presentation with slurred speech. EXAM: CT ANGIOGRAPHY HEAD AND NECK TECHNIQUE: Multidetector CT imaging of the head and neck was performed using the standard protocol during bolus administration of intravenous contrast. Multiplanar CT  image reconstructions and MIPs were obtained to evaluate the vascular anatomy. Carotid stenosis measurements (when applicable) are obtained utilizing NASCET criteria, using the distal internal carotid diameter as the denominator. CONTRAST:  741mOMNIPAQUE IOHEXOL 350 MG/ML SOLN COMPARISON:  Head CT same day FINDINGS: CTA NECK FINDINGS Aortic arch: Aortic atherosclerosis. Branching pattern is normal without origin stenosis. Right carotid system: Common carotid artery widely patent to the bifurcation. Soft and calcified plaque at the carotid bifurcation and ICA bulb. No stenosis beyond the diameter of the more distal cervical ICA. Left carotid system: Common carotid artery widely patent to the bifurcation. Mild soft plaque at the carotid  bifurcation and ICA bulb but no stenosis beyond the diameter of the more distal cervical ICA. Vertebral arteries: Both vertebral artery origins are widely patent. Both vertebral arteries appear normal through the cervical region to the foramen magnum. Skeleton: Ordinary cervical spondylosis. Other neck: No mass or lymphadenopathy. Dilated fluid and air-filled esophagus. Upper chest: Abnormal density in the lateral aspect of the right upper lobe with solid and sub solid components measuring approximately 2.8 cm. This has a worrisome appearance and could possibly be neoplastic or infectious. Review of the MIP images confirms the above findings CTA HEAD FINDINGS Anterior circulation: Both internal carotid arteries are patent through the skull base and siphon regions. Siphon atherosclerotic calcification but without stenosis greater than 30%. The anterior and middle cerebral vessels are patent. No large or medium vessel occlusion is seen. Distal vessel narrowing and atherosclerotic irregularity. Posterior circulation: Both vertebral arteries are patent to the basilar. The basilar is a small vessel. Both posterior cerebral arteries receive most of there supply from the anterior  circulation. Distal vessel narrowing and irregularity within the PCA branches. Venous sinuses: Patent and normal. Anatomic variants: None significant. Review of the MIP images confirms the above findings IMPRESSION: No large or medium vessel occlusion. Atherosclerotic plaque at both carotid bifurcations but without stenosis. Intracranial distal branch vessel irregularity. Dilated fluid and air-filled esophagus. 2.8 cm solid and sub solid lesion in the lateral aspect of the right upper lobe. This could be pneumonia or, more concerning, a neoplastic lesion. Complete chest CT suggested when able. This may require further workup. Electronically Signed   By: Nelson Chimes M.D.   On: 12/27/2020 18:27    EKG: Independently reviewed.  Assessment/Plan Principal Problem:   Acute ischemic stroke (HCC) Active Problems:   DM (diabetes mellitus) with peripheral vascular complication (HCC)   Left hemiparesis (HCC)   History of CVA with residual deficit   Abnormal finding on lung imaging    1. Acute ischemic stroke - 1. Neuro consult 2. Stroke pathway 3. 2D echo 4. Tele monitor 5. PT/OT/SLP 6. ASA 81 pending neuro recs 7. Restart statin that she wasn't taking at home 8. A1C, FLP in AM 2. Abnormal finding on lung imaging - 1. Needs CT chest during hospital stay after she can get IV contrast dye again (holding off for the moment since she just had CTA head and neck). 3. DM2 - 1. Sensitive SSI Q4H for the moment pending stroke swallow screen 4. HTN - 1. Holding home BP meds and allowing permissive HTN 2. Labetalol IV PRN SBP > 220  DVT prophylaxis: SCDs - h/o hemorrhagic stroke in past Code Status: Full Family Communication: No family in room Disposition Plan: TBD Consults called: Dr. Cheral Marker Admission status: Admit to inpatient  Severity of Illness: The appropriate patient status for this patient is INPATIENT. Inpatient status is judged to be reasonable and necessary in order to provide the  required intensity of service to ensure the patient's safety. The patient's presenting symptoms, physical exam findings, and initial radiographic and laboratory data in the context of their chronic comorbidities is felt to place them at high risk for further clinical deterioration. Furthermore, it is not anticipated that the patient will be medically stable for discharge from the hospital within 2 midnights of admission. The following factors support the patient status of inpatient.   IP status for new acute ischemic stroke.   * I certify that at the point of admission it is my clinical judgment that the patient will require inpatient  hospital care spanning beyond 2 midnights from the point of admission due to high intensity of service, high risk for further deterioration and high frequency of surveillance required.*    Herron Fero M. DO [facility]  How to contact the [facility] Attending or Consulting provider [place] or covering provider during after hours [place], for this patient?  1. Check the care team in [facility] and look for a) attending/consulting [facility] provider listed and b) the [facility] team listed 2. Log into [email or web address]  Amion Physician Scheduling and messaging for groups and whole hospitals  On call and physician scheduling software for group practices, residents, hospitalists and other medical providers for call, clinic, rotation and shift schedules. On[employer] is a hospital-wide system for scheduling doctors and paging doctors on call. EasyPlot is for scientific plotting and data analysis.  [email or web address]  and use [place]'s universal password to access. If you do not have the password, please contact the hospital operator.  3. Locate the [facility] provider you are looking for under [facility] and page to a number that you can be directly reached. 4. If you still have difficulty reaching the provider, please page the [facility] (Director on Call) for the Hospitalists listed on amion  for assistance.  12/27/2020, 9:02 PM

## 2020-12-27 NOTE — ED Notes (Signed)
Darl Householder MD made aware of patient being hypertensive.

## 2020-12-27 NOTE — ED Notes (Signed)
Threasa Heads [phone] and Ralene Bathe [phone] can be contacted with any updates.

## 2020-12-27 NOTE — ED Provider Notes (Signed)
[facility] EMERGENCY DEPARTMENT Provider Note   CSN: [id number] Arrival date & time: 12/27/20  1743  An emergency department physician performed an initial assessment on this suspected stroke patient at 1745.  History Chief Complaint  Patient presents with  . Code Stroke    Diamond Rice is a 50 y.o. female hx of COPD, DM, strokes here presenting with trouble speaking.  Patient states that she has been having trouble walking for the last week or so.  This morning, around 10 AM she was noted to have some trouble speaking.  She has baseline trouble speaking after her stroke about a year ago.  Patient was noted to be hypertensive per EMS.  Code stroke was activated by EMS  The history is provided by the patient.       Past Medical History:  Diagnosis Date  . Anxiety   . Chronic lower back pain 04/17/2012   "just got over some; catched when I walked"  . COPD (chronic obstructive pulmonary disease) ([place])   . Critical lower limb ischemia ([place]) 05/27/2018  . Depression   . Headache(784.0) 04/17/2012   "~ qod; lately waking up in am w/one"  . High cholesterol   . Hypertension   . Migraines 04/17/2012  . Obesity   . Sleep apnea    CPAP  . Stroke ([place])   . Type II diabetes mellitus ([place]) 08/28/2002    Patient Active Problem List   Diagnosis Date Noted  . Severe episode of recurrent major depressive disorder, with psychotic features ([place])   . Acute CVA (cerebrovascular accident) ([place]) 12/06/2019  . AKI (acute kidney injury) ([place]) 12/06/2019  . LVH (left ventricular hypertrophy) 08/10/2019  . Edema of lower extremity 11/26/2018  . Loss of appetite 11/26/2018  . Urinary incontinence 11/26/2018  . Allergic rhinitis 11/14/2018  . Deep venous thrombosis ([place]) 11/14/2018  . Nicotine dependence 11/14/2018  . Hemorrhagic stroke ([place])   . Diastolic dysfunction   . Type 2 diabetes mellitus ([place])   . Anemia of chronic disease   . Chronic obstructive pulmonary  disease ([place])   . ICH (intracerebral hemorrhage) ([place]) 08/30/2018  . Hypertensive heart disease without heart failure 07/04/2018  . Nonrheumatic aortic valve insufficiency 07/04/2018  . Esophageal abnormality   . Class 2 severe obesity due to excess calories with serious comorbidity and body mass index (BMI) of 35.0 to 35.9 in adult [facility])   . Peripheral vascular disease ([place])   . History of osteomyelitis   . History of amputation of lesser toe ([place])   . Pulmonary embolism ([place]) 06/22/2018  . Microcytic anemia 06/17/2018  . At risk for adverse drug reaction 06/11/2018  . Critical lower limb ischemia ([place]) 05/27/2018  . GI bleeding 04/02/2018  . Acute renal failure (ARF) ([place]) 04/02/2018  . Hypokalemia 04/02/2018  . Polysubstance abuse ([place]) 04/02/2018  . Diabetic foot ulcer ([place]) 04/02/2018  . Tinea pedis 03/07/2018  . Diabetic peripheral neuropathy ([place]) 01/24/2018  . Cannabis use disorder, moderate, dependence ([place]) 05/19/2017  . Cocaine use disorder, moderate, dependence ([place]) 05/19/2017  . CKD (chronic kidney disease) stage 3, GFR 30-59 ml/min (HCC) 02/17/2017  . Noncompliance with medications 02/17/2017  . Suicide attempt ([place]) 11/06/2016  . Mild cognitive impairment 03/21/2016  . Moderate episode of recurrent major depressive disorder ([place]) 03/21/2016  . B12 deficiency 03/09/2016  . Iron deficiency anemia 03/07/2016  . History of CVA with residual deficit 12/30/2015  . Schizoaffective disorder, bipolar type ([place]) 10/07/2015  . OSA (obstructive sleep  apnea) 06/09/2015  . Vitamin D deficiency 01/06/2015  . Bilateral knee pain 01/05/2015  . Numbness and tingling in right hand 01/05/2015  . Insomnia 12/07/2014  . Right-sided lacunar infarction ([place]) 09/15/2014  . Left hemiparesis ([place]) 09/15/2014  . Dyspnea   . Back pain 09/11/2013  . Psychoactive substance-induced organic mood disorder ([place]) 05/28/2013  . Cocaine abuse with cocaine-induced mood disorder ([place]) 05/28/2013  .  Cannabis dependence with cannabis-induced anxiety disorder ([place]) 05/28/2013  . Morbid obesity with BMI of 45.0-49.9, adult ([name]) 04/25/2012  . Chest pain 04/17/2012  . Hypertension 04/17/2012  . DM (diabetes mellitus) with peripheral vascular complication ([place]) 70/48/8891  . Hyperlipidemia 04/17/2012  . Tobacco use 04/17/2012    Past Surgical History:  Procedure Laterality Date  . ABDOMINAL AORTOGRAM W/LOWER EXTREMITY Left 05/27/2018   Procedure: ABDOMINAL AORTOGRAM W/LOWER EXTREMITY Runoff and Possible Intervention;  Surgeon: Waynetta Sandy, MD;  Location: [facility];  Service: Cardiovascular;  Laterality: Left;  . APPLICATION OF WOUND VAC Left 06/06/2018   Procedure: APPLICATION OF WOUND VAC;  Surgeon: Waynetta Sandy, MD;  Location: [facility];  Service: Vascular;  Laterality: Left;  . BIOPSY  04/04/2018   Procedure: BIOPSY;  Surgeon: [name], MD;  Location: [facility] ENDOSCOPY;  Service: Endoscopy;;  . BIOPSY  10/27/2020   Procedure: BIOPSY;  Surgeon: Arta Silence, MD;  Location: [facility] ENDOSCOPY;  Service: Endoscopy;;  . CARDIAC CATHETERIZATION  ~ 2011  . COLONOSCOPY N/A 04/04/2018   Procedure: COLONOSCOPY;  Surgeon: [name], MD;  Location: [facility] ENDOSCOPY;  Service: Endoscopy;  Laterality: N/A;  . COLONOSCOPY WITH PROPOFOL N/A 10/27/2020   Procedure: COLONOSCOPY WITH PROPOFOL;  Surgeon: Arta Silence, MD;  Location: [facility] ENDOSCOPY;  Service: Endoscopy;  Laterality: N/A;  . ESOPHAGOGASTRODUODENOSCOPY (EGD) WITH PROPOFOL N/A 10/27/2020   Procedure: ESOPHAGOGASTRODUODENOSCOPY (EGD) WITH PROPOFOL  WITH POSSIBLE DIL;  Surgeon: Arta Silence, MD;  Location: [facility] ENDOSCOPY;  Service: Endoscopy;  Laterality: N/A;  . LOWER EXTREMITY ANGIOGRAM Left 05/27/2018  . PERIPHERAL VASCULAR INTERVENTION Left 05/27/2018   Procedure: PERIPHERAL VASCULAR INTERVENTION;  Surgeon: Waynetta Sandy, MD;  Location: [facility];  Service: Cardiovascular;  Laterality: Left;  .  POLYPECTOMY  04/04/2018   Procedure: POLYPECTOMY;  Surgeon: [name], MD;  Location: [facility] ENDOSCOPY;  Service: Endoscopy;;  . POLYPECTOMY  10/27/2020   Procedure: POLYPECTOMY;  Surgeon: Arta Silence, MD;  Location: [facility] ENDOSCOPY;  Service: Endoscopy;;  . TRANSMETATARSAL AMPUTATION Left 05/28/2018   Procedure: AMPUTATION TOES THREE, FOUR AND FIVE on left foot;  Surgeon: Waynetta Sandy, MD;  Location: [facility];  Service: Vascular;  Laterality: Left;  . WOUND DEBRIDEMENT Left 06/06/2018   Procedure: DEBRIDEMENT WOUND LEFT FOOT;  Surgeon: Waynetta Sandy, MD;  Location: [facility];  Service: Vascular;  Laterality: Left;     OB History   No obstetric history on file.     Family History  Problem Relation Age of Onset  . Stroke Brother 77  . Stroke Brother 102  . Heart attack Mother 24  . Stroke Father 26    Social History   Tobacco Use  . Smoking status: Former Smoker    Packs/day: 0.25    Years: 28.00    Pack years: 7.00    Types: Cigarettes  . Smokeless tobacco: Never Used  Vaping Use  . Vaping Use: Never used  Substance Use Topics  . Alcohol use: No    Alcohol/week: 0.0 standard drinks    Comment: rare  . Drug use: Yes  Types: Marijuana, "Crack" cocaine    Comment: marijuana last use days ago; last cocaine several months ago    Home Medications Prior to Admission medications   Medication Sig Start Date End Date Taking? Authorizing Provider  ACCU-CHEK GUIDE test strip 2 (two) times daily. as directed 02/06/20   [provider]  Accu-Chek Softclix Lancets lancets 2 (two) times daily. 02/06/20   [provider]  amLODipine (NORVASC) 10 MG tablet Take 1 tablet (10 mg total) by mouth daily. 01/22/20   Royal Hawthorn, NP  ARIPiprazole (ABILIFY) 15 MG tablet Take 1 tablet (15 mg total) by mouth at bedtime. Patient not taking: No sig reported 01/22/20   Royal Hawthorn, NP  ARIPiprazole (ABILIFY) 5 MG tablet Take 5 mg by mouth daily. 08/28/20    [provider]  aspirin EC 81 MG EC tablet Take 1 tablet (81 mg total) by mouth daily. Patient not taking: No sig reported 09/21/18   Donzetta Starch, NP  atorvastatin (LIPITOR) 40 MG tablet Take 1 tablet (40 mg total) by mouth daily at 6 PM. Patient not taking: No sig reported 01/22/20   Royal Hawthorn, NP  Blood Glucose Monitoring Suppl (ACCU-CHEK GUIDE) w/Device KIT USE AS DIRECTED TO CHECK BLOOD SUGAR 02/06/20   [provider]  cilostazol (PLETAL) 100 MG tablet Take 1 tablet (100 mg total) by mouth 2 (two) times daily. Patient not taking: No sig reported 01/22/20   Royal Hawthorn, NP  cloNIDine (CATAPRES) 0.3 MG tablet Take 1 tablet (0.3 mg total) by mouth 2 (two) times daily. 01/22/20   Royal Hawthorn, NP  escitalopram (LEXAPRO) 10 MG tablet Take 10 mg by mouth daily. 04/29/20   [provider]  ferrous sulfate 325 (65 FE) MG tablet Take 1 tablet (325 mg total) by mouth daily with breakfast. Patient not taking: No sig reported 08/26/18   Medina-Vargas, Monina C, NP  hydrALAZINE (APRESOLINE) 100 MG tablet Take 1 tablet (100 mg total) by mouth 3 (three) times daily. 01/22/20   Royal Hawthorn, NP  HYDROcodone-acetaminophen (NORCO/VICODIN) 5-325 MG tablet Take 1 tablet by mouth every 6 (six) hours as needed for severe pain. Patient not taking: No sig reported 02/18/20   Alfredia Client, PA-C  insulin detemir (LEVEMIR) 100 UNIT/ML FlexPen Inject 15 Units into the skin 2 (two) times daily. 01/22/20   Royal Hawthorn, NP  lidocaine (LIDODERM) 5 % Place 1 patch onto the skin daily. Remove & Discard patch within 12 hours or as directed by MD Patient not taking: No sig reported 02/18/20   Alfredia Client, PA-C  lisinopril (ZESTRIL) 40 MG tablet Take 1 tablet (40 mg total) by mouth daily. 01/22/20   Royal Hawthorn, NP  methylPREDNISolone (MEDROL DOSEPAK) 4 MG TBPK tablet Per dose pack instructions Patient not taking: No sig reported 02/18/20   Alfredia Client, PA-C  metoprolol succinate  (TOPROL-XL) 50 MG 24 hr tablet Take 50 mg by mouth every morning. 08/02/20   [provider]  metoprolol tartrate (LOPRESSOR) 25 MG tablet Take 0.5 tablets (12.5 mg total) by mouth 2 (two) times daily. Patient not taking: No sig reported 01/22/20   Royal Hawthorn, NP  mirtazapine (REMERON) 15 MG tablet Take 1 tablet (15 mg total) by mouth at bedtime. For Depression 01/22/20   Royal Hawthorn, NP    Allergies    Morphine and related and Metformin  Review of Systems   Review of Systems  Neurological: Positive for speech difficulty.  All other systems reviewed and are negative.   Physical Exam  Updated Vital Signs BP (!) 226/138   Pulse 84   Temp 99 F (37.2 C) (Oral)   Resp 19   LMP 04/26/2018 (Approximate)   SpO2 100%   Physical Exam Vitals and nursing note reviewed.  Constitutional:      Comments: Chronically ill and some expressive aphasia  HENT:     Head: Normocephalic.     Nose: Nose normal.     Mouth/Throat:     Mouth: Mucous membranes are moist.  Eyes:     Extraocular Movements: Extraocular movements intact.     Pupils: Pupils are equal, round, and reactive to light.  Cardiovascular:     Rate and Rhythm: Normal rate and regular rhythm.     Pulses: Normal pulses.     Heart sounds: Normal heart sounds.  Pulmonary:     Effort: Pulmonary effort is normal.     Breath sounds: Normal breath sounds.  Abdominal:     General: Abdomen is flat.     Palpations: Abdomen is soft.  Musculoskeletal:        General: Normal range of motion.     Cervical back: Normal range of motion and neck supple.  Skin:    General: Skin is warm.     Capillary Refill: Capillary refill takes less than 2 seconds.  Neurological:     Comments: Mild left facial droop and some expressive aphasia.  Psychiatric:        Mood and Affect: Mood normal.     ED Results / Procedures / Treatments   Labs (all labs ordered are listed, but only abnormal results are displayed) Labs Reviewed  CBC  - Abnormal; Notable for the following components:      Result Value   RBC 5.39 (*)    MCV 73.8 (*)    MCH 23.2 (*)    RDW 16.2 (*)    All other components within normal limits  COMPREHENSIVE METABOLIC PANEL - Abnormal; Notable for the following components:   Glucose, Bld 143 (*)    Creatinine, Ser 1.04 (*)    AST 11 (*)    All other components within normal limits  I-STAT CHEM 8, ED - Abnormal; Notable for the following components:   Glucose, Bld 143 (*)    All other components within normal limits  RESP PANEL BY RT-PCR (FLU A&B, COVID) ARPGX2  ETHANOL  PROTIME-INR  APTT  DIFFERENTIAL  RAPID URINE DRUG SCREEN, HOSP PERFORMED  URINALYSIS, ROUTINE W REFLEX MICROSCOPIC  I-STAT BETA HCG BLOOD, ED ([facility], [facility], [facility] ONLY)    EKG None  Radiology DG Chest Port 1 View  Result Date: 12/27/2020 CLINICAL DATA:  Altered mental status, indistinct focal opacity on neck CT angiogram EXAM: PORTABLE CHEST 1 VIEW COMPARISON:  10/27/2020 chest radiograph. FINDINGS: Stable cardiomediastinal silhouette with mild cardiomegaly. No pneumothorax. No pleural effusion. Indistinct faint opacity in the apical right lung. Otherwise clear lungs with no overt pulmonary edema. IMPRESSION: Stable mild cardiomegaly without overt pulmonary edema. Indistinct faint opacity in the apical right lung, correlating to the site of mixed density opacity on the neck CT angiogram from earlier today. Short-term follow-up dedicated chest CT recommended when clinically feasible. Electronically Signed   By: Ilona Sorrel M.D.   On: 12/27/2020 19:11   CT HEAD CODE STROKE WO CONTRAST  Result Date: 12/27/2020 CLINICAL DATA:  Code stroke. Acute presentation with slurred speech. EXAM: CT HEAD WITHOUT CONTRAST TECHNIQUE: Contiguous axial images were obtained from the base of the skull through the vertex without intravenous  contrast. COMPARISON:  MRI 12/06/2019.  CT 12/06/2019 FINDINGS: Brain: No abnormality seen affecting the brainstem or  cerebellum. 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 Chronic small-vessel ischemic changes are present throughout the hemispheric white matter. This appears progressive since last year. No definitely acute stroke. One could not rule out the possibility of a recent small vessel infarction. No sign of large vessel or cortical infarction Vascular: There is atherosclerotic calcification of the major vessels at the base of the brain. Skull: Negative Sinuses/Orbits: Clear/normal Other: None ASPECTS ([place] Stroke Program Early CT Score) - Ganglionic level infarction (caudate, lentiform nuclei, internal capsule, insula, M1-M3 cortex): 7 - Supraganglionic infarction (M4-M6 cortex): 3 Total score (0-10 with 10 being normal): 10 IMPRESSION: 1. No sign of acute large vessel infarction. Chronic small-vessel ischemic changes throughout the brain, somewhat progressive since April of 2021. Cannot rule out the possibility of a more recent small vessel infarction. 2. ASPECTS is 10 3. These results were communicated to Dr. Cheral Marker at 6:04 pmon 5/2/2022by text page via the [facility] messaging system. Electronically Signed   By: Nelson Chimes M.D.   On: 12/27/2020 18:05   CT ANGIO HEAD CODE STROKE  Result Date: 12/27/2020 CLINICAL DATA:  Acute stroke presentation with slurred speech. EXAM: CT ANGIOGRAPHY HEAD AND NECK TECHNIQUE: Multidetector CT imaging of the head and neck was performed using the standard protocol during bolus administration of intravenous contrast. Multiplanar CT image reconstructions and MIPs were obtained to evaluate the vascular anatomy. Carotid stenosis measurements (when applicable) are obtained utilizing NASCET criteria, using the distal internal carotid diameter as the denominator. CONTRAST:  49m OMNIPAQUE IOHEXOL 350 MG/ML SOLN COMPARISON:  Head CT same day FINDINGS: CTA NECK FINDINGS Aortic arch: Aortic atherosclerosis. Branching pattern is normal without origin stenosis. Right carotid  system: Common carotid artery widely patent to the bifurcation. Soft and calcified plaque at the carotid bifurcation and ICA bulb. No stenosis beyond the diameter of the more distal cervical ICA. Left carotid system: Common carotid artery widely patent to the bifurcation. Mild soft plaque at the carotid bifurcation and ICA bulb but no stenosis beyond the diameter of the more distal cervical ICA. Vertebral arteries: Both vertebral artery origins are widely patent. Both vertebral arteries appear normal through the cervical region to the foramen magnum. Skeleton: Ordinary cervical spondylosis. Other neck: No mass or lymphadenopathy. Dilated fluid and air-filled esophagus. Upper chest: Abnormal density in the lateral aspect of the right upper lobe with solid and sub solid components measuring approximately 2.8 cm. This has a worrisome appearance and could possibly be neoplastic or infectious. Review of the MIP images confirms the above findings CTA HEAD FINDINGS Anterior circulation: Both internal carotid arteries are patent through the skull base and siphon regions. Siphon atherosclerotic calcification but without stenosis greater than 30%. The anterior and middle cerebral vessels are patent. No large or medium vessel occlusion is seen. Distal vessel narrowing and atherosclerotic irregularity. Posterior circulation: Both vertebral arteries are patent to the basilar. The basilar is a small vessel. Both posterior cerebral arteries receive most of there supply from the anterior circulation. Distal vessel narrowing and irregularity within the PCA branches. Venous sinuses: Patent and normal. Anatomic variants: None significant. Review of the MIP images confirms the above findings IMPRESSION: No large or medium vessel occlusion. Atherosclerotic plaque at both carotid bifurcations but without stenosis. Intracranial distal branch vessel irregularity. Dilated fluid and air-filled esophagus. 2.8 cm solid and sub solid lesion in  the lateral aspect of the right upper lobe.  This could be pneumonia or, more concerning, a neoplastic lesion. Complete chest CT suggested when able. This may require further workup. Electronically Signed   By: Nelson Chimes M.D.   On: 12/27/2020 18:27   CT ANGIO NECK CODE STROKE  Result Date: 12/27/2020 CLINICAL DATA:  Acute stroke presentation with slurred speech. EXAM: CT ANGIOGRAPHY HEAD AND NECK TECHNIQUE: Multidetector CT imaging of the head and neck was performed using the standard protocol during bolus administration of intravenous contrast. Multiplanar CT image reconstructions and MIPs were obtained to evaluate the vascular anatomy. Carotid stenosis measurements (when applicable) are obtained utilizing NASCET criteria, using the distal internal carotid diameter as the denominator. CONTRAST:  37m OMNIPAQUE IOHEXOL 350 MG/ML SOLN COMPARISON:  Head CT same day FINDINGS: CTA NECK FINDINGS Aortic arch: Aortic atherosclerosis. Branching pattern is normal without origin stenosis. Right carotid system: Common carotid artery widely patent to the bifurcation. Soft and calcified plaque at the carotid bifurcation and ICA bulb. No stenosis beyond the diameter of the more distal cervical ICA. Left carotid system: Common carotid artery widely patent to the bifurcation. Mild soft plaque at the carotid bifurcation and ICA bulb but no stenosis beyond the diameter of the more distal cervical ICA. Vertebral arteries: Both vertebral artery origins are widely patent. Both vertebral arteries appear normal through the cervical region to the foramen magnum. Skeleton: Ordinary cervical spondylosis. Other neck: No mass or lymphadenopathy. Dilated fluid and air-filled esophagus. Upper chest: Abnormal density in the lateral aspect of the right upper lobe with solid and sub solid components measuring approximately 2.8 cm. This has a worrisome appearance and could possibly be neoplastic or infectious. Review of the MIP images confirms  the above findings CTA HEAD FINDINGS Anterior circulation: Both internal carotid arteries are patent through the skull base and siphon regions. Siphon atherosclerotic calcification but without stenosis greater than 30%. The anterior and middle cerebral vessels are patent. No large or medium vessel occlusion is seen. Distal vessel narrowing and atherosclerotic irregularity. Posterior circulation: Both vertebral arteries are patent to the basilar. The basilar is a small vessel. Both posterior cerebral arteries receive most of there supply from the anterior circulation. Distal vessel narrowing and irregularity within the PCA branches. Venous sinuses: Patent and normal. Anatomic variants: None significant. Review of the MIP images confirms the above findings IMPRESSION: No large or medium vessel occlusion. Atherosclerotic plaque at both carotid bifurcations but without stenosis. Intracranial distal branch vessel irregularity. Dilated fluid and air-filled esophagus. 2.8 cm solid and sub solid lesion in the lateral aspect of the right upper lobe. This could be pneumonia or, more concerning, a neoplastic lesion. Complete chest CT suggested when able. This may require further workup. Electronically Signed   By: MNelson ChimesM.D.   On: 12/27/2020 18:27    Procedures Procedures   Medications Ordered in ED Medications  hydrALAZINE (APRESOLINE) injection 10 mg (has no administration in time range)  iohexol (OMNIPAQUE) 350 MG/ML injection 75 mL (75 mLs Intravenous Contrast Given 12/27/20 1815)    ED Course  I have reviewed the triage vital signs and the nursing notes.  Pertinent labs & imaging results that were available during my care of the patient were reviewed by me and considered in my medical decision making (see chart for details).    MDM Rules/Calculators/A&P                         RLashaunda Rice a 50y.o. female here presenting with expressive  aphasia and some left facial droop.  Patient has  history of slurred speech since her last stroke about a year ago.  Consider head bleed versus ischemic stroke. Dr. Cheral Marker at bedside.  Code stroke was activated.  We will get CTA head and neck and MRI brain.  8:01 PM CTA showed no LVO.  She has an incidental hyper lobe nodule.  Patient is hypertensive to 226/138.  Discussed case with Dr. Cheral Marker from neurology.  He recommend hospitalist admission for stroke workup.  Keep blood pressure around 180s.  Ordered hydralazine.  Hospitalist to admit.  MRI pending.   Final Clinical Impression(s) / ED Diagnoses Final diagnoses:  None    Rx / DC Orders ED Discharge Orders    None       Drenda Freeze, MD 12/27/20 2024

## 2020-12-27 NOTE — ED Notes (Signed)
Alcario Drought DO aware of patient failing swallow screen. Pt NPO at this time.

## 2020-12-27 NOTE — ED Notes (Signed)
Patient transported to MRI 

## 2020-12-27 NOTE — ED Triage Notes (Signed)
Pt bibems from home as a CODE STROKE. Pt c/o bilateral leg pain/headache/dizziness. This morning at 10 am patients family saw her at her baseline and then at 3pm they stated that her speech was more slurred than usual. Pt also c/o decreased appetite over the last few days. Pt has hx of strokes with L sided deficits. Last stroke December 07 2019. Pt alert and oriented x4.

## 2020-12-27 NOTE — Consult Note (Signed)
NEURO HOSPITALIST CONSULT NOTE   Requestig physician: Dr. Darl Householder  Reason for Consult: Acute onset of worsened dysarthria  History obtained from:  Patient and Chart     HPI:                                                                                                                                          Diamond Rice is an 50 y.o. female presenting with acute onset of worsened dysarthria relative to her baseline. LKN was 10:00 AM today. She has a history of 4 strokes. Also with a history of PE for which she was on oral anticoagulation.; this was later stopped due to a hemorrhagic stroke. The first two strokes were ischemic, without permanent deficits, per patient. The 3rd stroke was due to "bleeding" and resulted in chronic dysarthria and left sided weakness. Her 4th stroke occurred in April of last year and was ischemic.   She states that today she experienced acute worsening of bilateral leg pain and dysarthria. Per EMS, BP on scene was 234/144 and CBG was 234.   PMHx also includes COPD, lower limb ischemia, depression, migraine headaches, hypercholesterolemia, HTN, obesity, sleep apnea and DM2.   Past Medical History:  Diagnosis Date  . Anxiety   . Chronic lower back pain 04/17/2012   "just got over some; catched when I walked"  . COPD (chronic obstructive pulmonary disease) ([place])   . Critical lower limb ischemia ([place]) 05/27/2018  . Depression   . Headache(784.0) 04/17/2012   "~ qod; lately waking up in am w/one"  . High cholesterol   . Hypertension   . Migraines 04/17/2012  . Obesity   . Sleep apnea    CPAP  . Stroke ([place])   . Type II diabetes mellitus ([place]) 08/28/2002    Past Surgical History:  Procedure Laterality Date  . ABDOMINAL AORTOGRAM W/LOWER EXTREMITY Left 05/27/2018   Procedure: ABDOMINAL AORTOGRAM W/LOWER EXTREMITY Run[place] and Possible Intervention;  Surgeon: Waynetta Sandy, MD;  Location: [place] [facility];  Service:  Cardiovascular;  Laterality: Left;  . APPLICATION OF WOUND VAC Left 06/06/2018   Procedure: APPLICATION OF WOUND VAC;  Surgeon: Waynetta Sandy, MD;  Location: [place];  Service: Vascular;  Laterality: Left;  . BIOPSY  04/04/2018   Procedure: BIOPSY;  Surgeon: [name], MD;  Location: [facility] ENDOSCOPY;  Service: Endoscopy;;  . BIOPSY  10/27/2020   Procedure: BIOPSY;  Surgeon: Arta Silence, MD;  Location: [facility] ENDOSCOPY;  Service: Endoscopy;;  . CARDIAC CATHETERIZATION  ~ 2011  . COLONOSCOPY N/A 04/04/2018   Procedure: COLONOSCOPY;  Surgeon: [name], MD;  Location: [facility] ENDOSCOPY;  Service: Endoscopy;  Laterality: N/A;  . COLONOSCOPY WITH PROPOFOL N/A 10/27/2020   Procedure: COLONOSCOPY WITH PROPOFOL;  Surgeon: Arta Silence, MD;  Location: [facility] ENDOSCOPY;  Service: Endoscopy;  Laterality: N/A;  . ESOPHAGOGASTRODUODENOSCOPY (EGD) WITH PROPOFOL N/A 10/27/2020   Procedure: ESOPHAGOGASTRODUODENOSCOPY (EGD) WITH PROPOFOL  WITH POSSIBLE DIL;  Surgeon: Arta Silence, MD;  Location: [facility] ENDOSCOPY;  Service: Endoscopy;  Laterality: N/A;  . LOWER EXTREMITY ANGIOGRAM Left 05/27/2018  . PERIPHERAL VASCULAR INTERVENTION Left 05/27/2018   Procedure: PERIPHERAL VASCULAR INTERVENTION;  Surgeon: Waynetta Sandy, MD;  Location: [place] [facility];  Service: Cardiovascular;  Laterality: Left;  . POLYPECTOMY  04/04/2018   Procedure: POLYPECTOMY;  Surgeon: [name], MD;  Location: [facility] ENDOSCOPY;  Service: Endoscopy;;  . POLYPECTOMY  10/27/2020   Procedure: POLYPECTOMY;  Surgeon: Arta Silence, MD;  Location: [facility] ENDOSCOPY;  Service: Endoscopy;;  . TRANSMETATARSAL AMPUTATION Left 05/28/2018   Procedure: AMPUTATION TOES THREE, FOUR AND FIVE on left foot;  Surgeon: Waynetta Sandy, MD;  Location: [place];  Service: Vascular;  Laterality: Left;  . WOUND DEBRIDEMENT Left 06/06/2018   Procedure: DEBRIDEMENT WOUND LEFT FOOT;  Surgeon: Waynetta Sandy, MD;  Location: [facility] OR;  Service:  Vascular;  Laterality: Left;    Family History  Problem Relation Age of Onset  . Stroke Brother 67  . Stroke Brother 46  . Heart attack Mother 15  . Stroke Father 12             Social History:  reports that she has quit smoking. Her smoking use included cigarettes. She has a 7.00 pack-year smoking history. She has never used smokeless tobacco. She reports current drug use. Drugs: Marijuana and "Crack" cocaine. She reports that she does not drink alcohol.  Allergies  Allergen Reactions  . Morphine And Related Hives  . Metformin Diarrhea and Other (See Comments)    "Allergic," per [facility]    MEDICATIONS:                                                                                                                     No current facility-administered medications on file prior to encounter.   Current Outpatient Medications on File Prior to Encounter  Medication Sig Dispense Refill  . amLODipine (NORVASC) 10 MG tablet Take 1 tablet (10 mg total) by mouth daily. 30 tablet 0  . ARIPiprazole (ABILIFY) 5 MG tablet Take 5 mg by mouth daily.    . cloNIDine (CATAPRES) 0.3 MG tablet Take 1 tablet (0.3 mg total) by mouth 2 (two) times daily. 60 tablet 0  . hydrALAZINE (APRESOLINE) 100 MG tablet Take 1 tablet (100 mg total) by mouth 3 (three) times daily. 90 tablet 0  . insulin detemir (LEVEMIR) 100 UNIT/ML FlexPen Inject 15 Units into the skin 2 (two) times daily. 15 mL 0  . lisinopril (ZESTRIL) 40 MG tablet Take 1 tablet (40 mg total) by mouth daily. 30 tablet 0  . metoprolol succinate (TOPROL-XL) 50 MG 24 hr tablet Take 50 mg by mouth every morning.    . mirtazapine (REMERON) 15 MG tablet Take 1 tablet (15 mg total) by mouth at bedtime. For  Depression 30 tablet 0  . ACCU-CHEK GUIDE test strip 2 (two) times daily. as directed    . Accu-Chek Softclix Lancets lancets 2 (two) times daily.    . aspirin EC 81 MG EC tablet Take 1 tablet (81 mg total) by mouth daily. (Patient not taking: No sig reported)     . atorvastatin (LIPITOR) 40 MG tablet Take 1 tablet (40 mg total) by mouth daily at 6 PM. (Patient not taking: No sig reported)    . Blood Glucose Monitoring Suppl (ACCU-CHEK GUIDE) w/Device KIT USE AS DIRECTED TO CHECK BLOOD SUGAR      Scheduled: . [START ON 12/28/2020] ARIPiprazole  5 mg Oral Daily  . [START ON 12/28/2020] aspirin  81 mg Oral Daily  . [START ON 12/28/2020] atorvastatin  40 mg Oral q1800  . insulin aspart  0-9 Units Subcutaneous Q4H  . mirtazapine  15 mg Oral QHS    ROS:                                                                                                                                       Complains of a frontal headache and left lower abdominal/flank pain. Other ROS as per HPI.    Last menstrual period 04/26/2018.   General Examination:                                                                                                       Physical Exam  HEENT-  [place]/AT    Lungs- Respirations unlabored Extremities- No edema  Neurological Examination Mental Status: Awake and alert. Oriented x 5. Speech is fluent with intact comprehension, naming and repetition. Occasional abrupt transitions into a nearly tearful affect, suggestive of pseudobulbar syndrome.  Cranial Nerves: II: Visual fields intact with no extinction to DSS. PERRL.   III,IV, VI: No ptosis. EOMI but with saccadic quality of pursuits.   V,VII: Temp sensation equal bilaterally. Left facial droop. Exhibits intermittent lip smacking throughout the interview.  VIII: hearing intact to voice IX,X: Palate elevates symmetrically  XI: Head is midline.  XII: Midline tongue extension Motor: RUE and RLE 5/5 LUE with increased flexor tone and flexion contracture. Decreased volitional ROM. Strength is 4-/5 within her volitional range of motion.  LLE: Unable to elevate antigravity at hip. ADF 3/5 Sensory: Decreased temp and FT sensation to LUE and LLE Deep Tendon Reflexes:  2+ right brachioradialis, 3+  left brachioradialis 4+ right patellar (crossed adductor), 3+ left patellar Plantars: Right: Downgoing     Left: Equivocal  Cerebellar: No ataxia with FNF on the right. Unable to perform on the left.  Gait: Unable to assess   Lab Results: Basic Metabolic Panel: Recent Labs  Lab 12/27/20 1755  NA 140  K 3.5  CL 103  GLUCOSE 143*  BUN 12  CREATININE 0.90    CBC: Recent Labs  Lab 12/27/20 1755  HGB 13.9  HCT 41.0    Cardiac Enzymes: No results for input(s): CKTOTAL, CKMB, CKMBINDEX, TROPONINI in the last 168 hours.  Lipid Panel: No results for input(s): CHOL, TRIG, HDL, CHOLHDL, VLDL, LDLCALC in the last 168 hours.  Imaging: No results found.   Assessment: 50 year old female presenting with worsened dysarthria. MRI brain reveals a subcentimeter left cerebral hemispheric acute ischemic infarction 1. Exam reveals left sided deficits referable to her old right basal ganglia ICH 2. MRI brain reveals a 4-5 mm acute infarction of the left hemispheric white matter in the upper parietal centrum semiovale region. Extensive chronic ischemic changes elsewhere throughout the brain are noted. There is evidence for an old right basal ganglia hemorrhage. . 3. CTA of head and neck: No large or medium vessel occlusion. Atherosclerotic plaque at both carotid bifurcations but without stenosis. Intracranial distal branch vessel irregularity. 4. Stroke risk factors: Prior strokes, PVD, hypercholesterolemia, HTN, obesity, sleep apnea, history of cocaine use and DM2.   Recommendations: 1. HgbA1c, fasting lipid panel 2. PT consult, OT consult, Speech consult 3. Cardiac telemetry 4. TTE 5. Continue atorvastatin 6. Restart ASA and add Plavix to her regimen as she has failed ASA monotherapy  7. Risk factor modification 8. Frequent neuro checks 9. Tylenol PRN pain.  10. Permissive HTN x 24 hours, but use modified SBP threshold of 180 given prior ICH 11. NPO until passes stroke swallow  screen 12. Of note, CTA incidentally images a 2.8 cm solid and sub solid lesion in the lateral aspect of the right upper lobe of the lung. This could be pneumonia or, more concerning, a neoplastic lesion. Complete chest CT suggested by Radiology to further assess.    Electronically signed: Dr. Kerney Elbe 12/27/2020, 6:02 PM

## 2020-12-28 ENCOUNTER — Inpatient Hospital Stay (HOSPITAL_COMMUNITY): Payer: Medicare Other

## 2020-12-28 DIAGNOSIS — I6389 Other cerebral infarction: Secondary | ICD-10-CM

## 2020-12-28 DIAGNOSIS — I639 Cerebral infarction, unspecified: Secondary | ICD-10-CM

## 2020-12-28 DIAGNOSIS — E1151 Type 2 diabetes mellitus with diabetic peripheral angiopathy without gangrene: Secondary | ICD-10-CM | POA: Diagnosis not present

## 2020-12-28 DIAGNOSIS — R918 Other nonspecific abnormal finding of lung field: Secondary | ICD-10-CM | POA: Diagnosis not present

## 2020-12-28 DIAGNOSIS — G8194 Hemiplegia, unspecified affecting left nondominant side: Secondary | ICD-10-CM | POA: Diagnosis not present

## 2020-12-28 DIAGNOSIS — I6930 Unspecified sequelae of cerebral infarction: Secondary | ICD-10-CM

## 2020-12-28 DIAGNOSIS — I10 Essential (primary) hypertension: Secondary | ICD-10-CM | POA: Diagnosis not present

## 2020-12-28 LAB — ECHOCARDIOGRAM COMPLETE
Height: 61 in
P 1/2 time: 489 msec
S' Lateral: 3 cm
Single Plane A2C EF: 57.7 %
Weight: 2716.07 oz

## 2020-12-28 LAB — HEMOGLOBIN A1C
Hgb A1c MFr Bld: 6.6 % — ABNORMAL HIGH (ref 4.8–5.6)
Mean Plasma Glucose: 142.72 mg/dL

## 2020-12-28 LAB — LIPID PANEL
Cholesterol: 209 mg/dL — ABNORMAL HIGH (ref 0–200)
HDL: 60 mg/dL (ref >40)
LDL Cholesterol: 136 mg/dL — ABNORMAL HIGH (ref 0–99)
Total CHOL/HDL Ratio: 3.5 RATIO
Triglycerides: 63 mg/dL (ref <150)
VLDL: 13 mg/dL (ref 0–40)

## 2020-12-28 LAB — GLUCOSE, CAPILLARY
Glucose-Capillary: 98 mg/dL (ref 70–99)
Glucose-Capillary: 144 mg/dL — ABNORMAL HIGH (ref 70–99)
Glucose-Capillary: 152 mg/dL — ABNORMAL HIGH (ref 70–99)
Glucose-Capillary: 102 mg/dL — ABNORMAL HIGH (ref 70–99)

## 2020-12-28 LAB — HIV ANTIBODY (ROUTINE TESTING W REFLEX): HIV Screen 4th Generation wRfx: NONREACTIVE

## 2020-12-28 LAB — CBG MONITORING, ED: Glucose-Capillary: 121 mg/dL — ABNORMAL HIGH (ref 70–99)

## 2020-12-28 MED ORDER — ATORVASTATIN CALCIUM 80 MG PO TABS
80.0000 mg | ORAL_TABLET | Freq: Every day | ORAL | Status: DC
  Administered 2020-12-28 – 2021-01-02 (×6): 80 mg via ORAL
  Filled 2020-12-28 (×6): qty 1

## 2020-12-28 MED ORDER — ORAL CARE MOUTH RINSE
15.0000 mL | Freq: Two times a day (BID) | OROMUCOSAL | Status: DC
  Administered 2020-12-28 – 2021-01-03 (×10): 15 mL via OROMUCOSAL

## 2020-12-28 MED ORDER — FENTANYL CITRATE (PF) 100 MCG/2ML IJ SOLN
25.0000 ug | Freq: Once | INTRAMUSCULAR | Status: AC
Start: 2020-12-28 — End: 2020-12-28
  Administered 2020-12-28: 25 ug via INTRAVENOUS
  Filled 2020-12-28: qty 2

## 2020-12-28 MED ORDER — CLOPIDOGREL BISULFATE 75 MG PO TABS: 75.0000 mg | ORAL_TABLET | Freq: Every day | ORAL | Status: DC

## 2020-12-28 MED ORDER — CHLORHEXIDINE GLUCONATE 0.12 % MT SOLN
15.0000 mL | Freq: Two times a day (BID) | OROMUCOSAL | Status: DC
  Administered 2020-12-28 – 2021-01-03 (×13): 15 mL via OROMUCOSAL
  Filled 2020-12-28 (×12): qty 15

## 2020-12-28 MED ORDER — ENOXAPARIN SODIUM 40 MG/0.4ML IJ SOSY
40.0000 mg | PREFILLED_SYRINGE | INTRAMUSCULAR | Status: DC
  Administered 2020-12-28 – 2021-01-02 (×6): 40 mg via SUBCUTANEOUS
  Filled 2020-12-28 (×6): qty 0.4

## 2020-12-28 NOTE — Progress Notes (Signed)
STROKE TEAM PROGRESS NOTE   SUBJECTIVE (INTERVAL HISTORY) No family is at the bedside.  Overall her condition is stable.  Patient lying in bed, awake alert, has chronic left spastic hemiparesis after previous ICH in 08/2018.  This time, patient had right lower extremity weakness and not able to walk with worsening slurred speech.  MRI showed left CR infarct.  Today patient stated that his speech now near baseline, still has right leg weaker than before but still better than the left.  At baseline, patient was able to walk with walker.  Discussed with rehab Dr. Dagoberto Ligas that patient need left upper extremity Botox for spasticity in the near future.   OBJECTIVE Temp:  [98.3 F (36.8 C)-99 F (37.2 C)] 99 F (37.2 C) (05/03 1518) Pulse Rate:  [61-98] 85 (05/03 1518) Cardiac Rhythm: Heart block (05/03 0837) Resp:  [11-28] 18 (05/03 1518) BP: (151-230)/(96-138) 199/122 (05/03 1518) SpO2:  [96 %-100 %] 100 % (05/03 1518) Weight:  [77 kg] 77 kg (05/03 0839)  Recent Labs  Lab 12/27/20 2140 12/27/20 2352 12/28/20 0346 12/28/20 1101 12/28/20 1621  GLUCAP 88 112* 121* 98 144*   Recent Labs  Lab 12/27/20 1749 12/27/20 1755  NA 138 140  K 3.5 3.5  CL 104 103  CO2 25  --   GLUCOSE 143* 143*  BUN 11 12  CREATININE 1.04* 0.90  CALCIUM 9.4  --    Recent Labs  Lab 12/27/20 1749  AST 11*  ALT 8  ALKPHOS 66  BILITOT 0.5  PROT 8.1  ALBUMIN 3.5   Recent Labs  Lab 12/27/20 1749 12/27/20 1755  WBC 6.9  --   NEUTROABS 3.2  --   HGB 12.5 13.9  HCT 39.8 41.0  MCV 73.8*  --   PLT 264  --    No results for input(s): CKTOTAL, CKMB, CKMBINDEX, TROPONINI in the last 168 hours. Recent Labs    12/27/20 1749  LABPROT 13.6  INR 1.0   Recent Labs    12/27/20 1930  COLORURINE STRAW*  LABSPEC 1.023  PHURINE 8.0  GLUCOSEU 50*  HGBUR NEGATIVE  BILIRUBINUR NEGATIVE  KETONESUR NEGATIVE  PROTEINUR 30*  NITRITE NEGATIVE  LEUKOCYTESUR NEGATIVE       Component Value Date/Time    CHOL 209 (H) 12/28/2020 0349   TRIG 63 12/28/2020 0349   HDL 60 12/28/2020 0349   CHOLHDL 3.5 12/28/2020 0349   VLDL 13 12/28/2020 0349   LDLCALC 136 (H) 12/28/2020 0349   Lab Results  Component Value Date   HGBA1C 6.6 (H) 12/28/2020      Component Value Date/Time   LABOPIA NONE DETECTED 12/27/2020 1930   COCAINSCRNUR NONE DETECTED 12/27/2020 1930   LABBENZ NONE DETECTED 12/27/2020 1930   AMPHETMU NONE DETECTED 12/27/2020 1930   THCU POSITIVE (A) 12/27/2020 1930   LABBARB NONE DETECTED 12/27/2020 1930    Recent Labs  Lab 12/27/20 1749  ETH <10    I have personally reviewed the radiological images below and agree with the radiology interpretations.  MR BRAIN WO CONTRAST  Result Date: 12/27/2020 CLINICAL DATA:  Acute stroke presentation with slurred speech. EXAM: MRI HEAD WITHOUT CONTRAST TECHNIQUE: Multiplanar, multiecho pulse sequences of the brain and surrounding structures were obtained without intravenous contrast. COMPARISON:  CT studies earlier same day.  MRI 12/06/2019. FINDINGS: Brain: Diffusion imaging shows a 4-5 mm acute infarction within the left upper centrum semiovale region. No other acute infarction. Chronic small-vessel ischemic changes affect pons. Old small vessel cerebellar infarctions. Old small  vessel infarctions of the thalami and basal ganglia. Hemosiderin deposition in the right basal ganglia and radiating white matter tracts. Chronic small-vessel ischemic changes of the hemispheric white matter. No large vessel territory infarction. No mass lesion, acute hemorrhage, hydrocephalus or extra-axial collection. Vascular: Major vessels at the base of the brain show flow. Skull and upper cervical spine: Negative Sinuses/Orbits: Clear/normal Other: None IMPRESSION: 4-5 mm acute infarction of the left hemispheric white matter in the upper parietal centrum semiovale region. No large or cortical infarction. Extensive chronic ischemic changes elsewhere throughout the brain  as outlined above. Electronically Signed   By: Nelson Chimes M.D.   On: 12/27/2020 20:50   DG Chest Port 1 View  Result Date: 12/27/2020 CLINICAL DATA:  Altered mental status, indistinct focal opacity on neck CT angiogram EXAM: PORTABLE CHEST 1 VIEW COMPARISON:  10/27/2020 chest radiograph. FINDINGS: Stable cardiomediastinal silhouette with mild cardiomegaly. No pneumothorax. No pleural effusion. Indistinct faint opacity in the apical right lung. Otherwise clear lungs with no overt pulmonary edema. IMPRESSION: Stable mild cardiomegaly without overt pulmonary edema. Indistinct faint opacity in the apical right lung, correlating to the site of mixed density opacity on the neck CT angiogram from earlier today. Short-term follow-up dedicated chest CT recommended when clinically feasible. Electronically Signed   By: Ilona Sorrel M.D.   On: 12/27/2020 19:11   DG Swallowing Func-Speech Pathology  Result Date: 12/28/2020 Objective Swallowing Evaluation: Type of Study: MBS-Modified Barium Swallow Study  Patient Details Name: Diamond Rice MRN: [id number] Date of Birth: 05/11/1971 Today's Date: 12/28/2020 Time: SLP Start Time (ACUTE ONLY): [id number] -SLP Stop Time (ACUTE ONLY): 1400 SLP Time Calculation (min) (ACUTE ONLY): 14 min Past Medical History: Past Medical History: Diagnosis Date . Anxiety  . Chronic lower back pain 04/17/2012  "just got over some; catched when I walked" . COPD (chronic obstructive pulmonary disease) ([place])  . Critical lower limb ischemia ([place]) 05/27/2018 . Depression  . Headache(784.0) 04/17/2012  "~ qod; lately waking up in am w/one" . High cholesterol  . Hypertension  . Migraines 04/17/2012 . Obesity  . Sleep apnea   CPAP . Stroke ([place])  . Type II diabetes mellitus ([place]) 08/28/2002 Past Surgical History: Past Surgical History: Procedure Laterality Date . ABDOMINAL AORTOGRAM W/LOWER EXTREMITY Left 05/27/2018  Procedure: ABDOMINAL AORTOGRAM W/LOWER EXTREMITY Runoff and Possible Intervention;  Surgeon: Waynetta Sandy, MD;  Location: [place] [facility];  Service: Cardiovascular;  Laterality: Left; . APPLICATION OF WOUND VAC Left 0000000  Procedure: APPLICATION OF WOUND VAC;  Surgeon: Waynetta Sandy, MD;  Location: [place];  Service: Vascular;  Laterality: Left; . BIOPSY  04/04/2018  Procedure: BIOPSY;  Surgeon: [name], MD;  Location: [facility] ENDOSCOPY;  Service: Endoscopy;; . BIOPSY  10/27/2020  Procedure: BIOPSY;  Surgeon: Arta Silence, MD;  Location: [facility] ENDOSCOPY;  Service: Endoscopy;; . CARDIAC CATHETERIZATION  ~ 2011 . COLONOSCOPY N/A 04/04/2018  Procedure: COLONOSCOPY;  Surgeon: [name], MD;  Location: [facility] ENDOSCOPY;  Service: Endoscopy;  Laterality: N/A; . COLONOSCOPY WITH PROPOFOL N/A 10/27/2020  Procedure: COLONOSCOPY WITH PROPOFOL;  Surgeon: Arta Silence, MD;  Location: [facility] ENDOSCOPY;  Service: Endoscopy;  Laterality: N/A; . ESOPHAGOGASTRODUODENOSCOPY (EGD) WITH PROPOFOL N/A 10/27/2020  Procedure: ESOPHAGOGASTRODUODENOSCOPY (EGD) WITH PROPOFOL  WITH POSSIBLE DIL;  Surgeon: Arta Silence, MD;  Location: [facility] ENDOSCOPY;  Service: Endoscopy;  Laterality: N/A; . LOWER EXTREMITY ANGIOGRAM Left 05/27/2018 . PERIPHERAL VASCULAR INTERVENTION Left 05/27/2018  Procedure: PERIPHERAL VASCULAR INTERVENTION;  Surgeon: Waynetta Sandy, MD;  Location: [place] [facility];  Service: Cardiovascular;  Laterality: Left; . POLYPECTOMY  04/04/2018  Procedure: POLYPECTOMY;  Surgeon: [name], MD;  Location: [facility] ENDOSCOPY;  Service: Endoscopy;; . POLYPECTOMY  10/27/2020  Procedure: POLYPECTOMY;  Surgeon: Arta Silence, MD;  Location: [facility] ENDOSCOPY;  Service: Endoscopy;; . TRANSMETATARSAL AMPUTATION Left 05/28/2018  Procedure: AMPUTATION TOES THREE, FOUR AND FIVE on left foot;  Surgeon: Waynetta Sandy, MD;  Location: [place];  Service: Vascular;  Laterality: Left; . WOUND DEBRIDEMENT Left 06/06/2018  Procedure: DEBRIDEMENT WOUND LEFT FOOT;  Surgeon: Waynetta Sandy, MD;  Location[facility]  OR;  Service: Vascular;  Laterality: Left; HPI: Pt is a 50 yo female with a h/o 4 prior strokes presents wtih an acute worsening of BLE pain and dysarthria. MRI showed an acute infarction of the L hemispheric white matter in the upper parietal centrum semiovale region. PMH also includes: DM2, HTN, HLD, smoking, prior cocaine abuse, PE, medication nonadhering. Most recent MBS 12/08/19 revealed aspiration of thin liquids (PAS 7, 8) with recs for Dys 2/Nectar. She reports advancing to regular solids/thin liquids at SNF after FEES. Pt also had dysarthria at that time impacting word to short phrase level.  Subjective: eager to eat and drink Assessment / Plan / Recommendation CHL IP CLINICAL IMPRESSIONS 12/28/2020 Clinical Impression Pt presents with a mild oral dysphagia with pharyngeal phase relatively WFL. She has intermittent oral holding with liquids, with mild lingual residue also noted primarily on her first few sips. She also has loss of regular solids to the pyriform sinuses while she is still masticating, but there is no aspiration across all consistencies. Recommend starting a diet consisting of regular solids and thin liquids. Given coughing noted during initial evaluation, will f/u briefly. SLP Visit Diagnosis Dysphagia, oral phase (R13.11) Attention and concentration deficit following -- Frontal lobe and executive function deficit following -- Impact on safety and function Mild aspiration risk   CHL IP TREATMENT RECOMMENDATION 12/28/2020 Treatment Recommendations Therapy as outlined in treatment plan below   Prognosis 12/28/2020 Prognosis for Safe Diet Advancement Good Barriers to Reach Goals -- Barriers/Prognosis Comment -- CHL IP DIET RECOMMENDATION 12/28/2020 SLP Diet Recommendations Regular solids;Thin liquid Liquid Administration via Cup;Straw Medication Administration Whole meds with liquid Compensations Slow rate;Small sips/bites Postural Changes Seated upright at 90 degrees;Remain semi-upright after after  feeds/meals (Comment)   CHL IP OTHER RECOMMENDATIONS 12/28/2020 Recommended Consults -- Oral Care Recommendations Oral care BID Other Recommendations --   CHL IP FOLLOW UP RECOMMENDATIONS 12/28/2020 Follow up Recommendations Inpatient Rehab   CHL IP FREQUENCY AND DURATION 12/28/2020 Speech Therapy Frequency (ACUTE ONLY) min 2x/week Treatment Duration 2 weeks      CHL IP ORAL PHASE 12/28/2020 Oral Phase Impaired Oral - Pudding Teaspoon -- Oral - Pudding Cup -- Oral - Honey Teaspoon -- Oral - Honey Cup -- Oral - Nectar Teaspoon -- Oral - Nectar Cup -- Oral - Nectar Straw -- Oral - Thin Teaspoon -- Oral - Thin Cup Lingual/palatal residue;Holding of bolus Oral - Thin Straw Holding of bolus Oral - Puree WFL Oral - Mech Soft -- Oral - Regular Premature spillage;Impaired mastication Oral - Multi-Consistency -- Oral - Pill WFL Oral Phase - Comment --  CHL IP PHARYNGEAL PHASE 12/28/2020 Pharyngeal Phase WFL Pharyngeal- Pudding Teaspoon -- Pharyngeal -- Pharyngeal- Pudding Cup -- Pharyngeal -- Pharyngeal- Honey Teaspoon -- Pharyngeal -- Pharyngeal- Honey Cup -- Pharyngeal -- Pharyngeal- Nectar Teaspoon -- Pharyngeal -- Pharyngeal- Nectar Cup -- Pharyngeal -- Pharyngeal- Nectar Straw -- Pharyngeal -- Pharyngeal- Thin Teaspoon -- Pharyngeal -- Pharyngeal- Thin Cup --  Pharyngeal -- Pharyngeal- Thin Straw -- Pharyngeal -- Pharyngeal- Puree -- Pharyngeal -- Pharyngeal- Mechanical Soft -- Pharyngeal -- Pharyngeal- Regular -- Pharyngeal -- Pharyngeal- Multi-consistency -- Pharyngeal -- Pharyngeal- Pill -- Pharyngeal -- Pharyngeal Comment --  CHL IP CERVICAL ESOPHAGEAL PHASE 12/28/2020 Cervical Esophageal Phase WFL Pudding Teaspoon -- Pudding Cup -- Honey Teaspoon -- Honey Cup -- Nectar Teaspoon -- Nectar Cup -- Nectar Straw -- Thin Teaspoon -- Thin Cup -- Thin Straw -- Puree -- Mechanical Soft -- Regular -- Multi-consistency -- Pill -- Cervical Esophageal Comment -- Osie Bond., M.A. [place] [facility] Pager [phone] Office  [phone] 12/28/2020, 3:02 PM              ECHOCARDIOGRAM COMPLETE  Result Date: 12/28/2020    ECHOCARDIOGRAM REPORT   Patient Name:   Diamond Rice Date of Exam: 12/28/2020 Medical Rec #:  [id number]         Height:       61.0 in Accession #:    [id number]        Weight:       169.8 lb Date of Birth:  [birth year]          BSA:          1.762 m Patient Age:    50 years          BP:           199/124 mmHg Patient Gender: F                 HR:           73 bpm. Exam Location:  Inpatient Procedure: 2D Echo Indications:    stroke  History:        Patient has prior history of Echocardiogram examinations, most                 recent 12/07/2019. Risk Factors:Hypertension, Dyslipidemia and                 Diabetes.  Sonographer:    Johny Chess Referring Phys: [place]  1. Left ventricular ejection fraction, by estimation, is 60 to 65%. The left ventricle has normal function. The left ventricle has no regional wall motion abnormalities. There is moderate left ventricular hypertrophy. Left ventricular diastolic parameters are consistent with Grade I diastolic dysfunction (impaired relaxation).  2. Right ventricular systolic function is normal. The right ventricular size is normal.  3. Left atrial size was mildly dilated.  4. The mitral valve is normal in structure. No evidence of mitral valve regurgitation. No evidence of mitral stenosis.  5. Left coronary cusp calcification. The aortic valve is tricuspid. There is mild calcification of the aortic valve. There is mild thickening of the aortic valve. Aortic valve regurgitation is moderate to severe. No aortic stenosis is present.  6. The inferior vena cava is normal in size with greater than 50% respiratory variability, suggesting right atrial pressure of 3 mmHg. Conclusion(s)/Recommendation(s): No intracardiac source of embolism detected on this transthoracic study. A transesophageal echocardiogram is recommended to exclude cardiac source of  embolism if clinically indicated. FINDINGS  Left Ventricle: Left ventricular ejection fraction, by estimation, is 60 to 65%. The left ventricle has normal function. The left ventricle has no regional wall motion abnormalities. The left ventricular internal cavity size was normal in size. There is  moderate left ventricular hypertrophy. Left ventricular diastolic parameters are consistent with Grade I diastolic dysfunction (impaired relaxation). Right Ventricle: The right ventricular size is  normal. No increase in right ventricular wall thickness. Right ventricular systolic function is normal. Left Atrium: Left atrial size was mildly dilated. Right Atrium: Right atrial size was normal in size. Pericardium: There is no evidence of pericardial effusion. Mitral Valve: The mitral valve is normal in structure. No evidence of mitral valve regurgitation. No evidence of mitral valve stenosis. Tricuspid Valve: The tricuspid valve is normal in structure. Tricuspid valve regurgitation is not demonstrated. No evidence of tricuspid stenosis. Aortic Valve: Left coronary cusp calcification. The aortic valve is tricuspid. There is mild calcification of the aortic valve. There is mild thickening of the aortic valve. Aortic valve regurgitation is moderate to severe. Aortic regurgitation PHT measures 489 msec. No aortic stenosis is present. Pulmonic Valve: The pulmonic valve was normal in structure. Pulmonic valve regurgitation is not visualized. No evidence of pulmonic stenosis. Aorta: The aortic root is normal in size and structure. Venous: The inferior vena cava is normal in size with greater than 50% respiratory variability, suggesting right atrial pressure of 3 mmHg. IAS/Shunts: No atrial level shunt detected by color flow Doppler.  LEFT VENTRICLE PLAX 2D LVIDd:         3.90 cm LVIDs:         3.00 cm LV PW:         1.60 cm LV IVS:        1.30 cm LVOT diam:     2.00 cm LV SV:         75 LV SV Index:   43 LVOT Area:     3.14 cm  LV  Volumes (MOD) LV vol d, MOD A2C: 81.6 ml LV vol s, MOD A2C: 34.5 ml LV SV MOD A2C:     47.1 ml RIGHT VENTRICLE             IVC RV S prime:     14.90 cm/s  IVC diam: 1.30 cm TAPSE (M-mode): 2.3 cm LEFT ATRIUM             Index       RIGHT ATRIUM           Index LA diam:        3.00 cm 1.70 cm/m  RA Area:     14.10 cm LA Vol (A2C):   61.3 ml 34.80 ml/m RA Volume:   30.60 ml  17.37 ml/m LA Vol (A4C):   56.4 ml 32.02 ml/m LA Biplane Vol: 59.9 ml 34.00 ml/m  AORTIC VALVE LVOT Vmax:   124.00 cm/s LVOT Vmean:  77.600 cm/s LVOT VTI:    0.239 m AI PHT:      489 msec  AORTA Ao Root diam: 2.60 cm Ao Asc diam:  3.40 cm MV E velocity: 66.70 cm/s MV A velocity: 132.00 cm/s  SHUNTS MV E/A ratio:  0.51         Systemic VTI:  0.24 m                             Systemic Diam: 2.00 cm Candee Furbish MD Electronically signed by Candee Furbish MD Signature Date/Time: 12/28/2020/11:31:32 AM    Final    CT HEAD CODE STROKE WO CONTRAST  Result Date: 12/27/2020 CLINICAL DATA:  Code stroke. Acute presentation with slurred speech. EXAM: CT HEAD WITHOUT CONTRAST TECHNIQUE: Contiguous axial images were obtained from the base of the skull through the vertex without intravenous contrast. COMPARISON:  MRI 12/06/2019.  CT 12/06/2019 FINDINGS: Brain: No  abnormality seen affecting the brainstem or cerebellum. 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 Chronic small-vessel ischemic changes are present throughout the hemispheric white matter. This appears progressive since last year. No definitely acute stroke. One could not rule out the possibility of a recent small vessel infarction. No sign of large vessel or cortical infarction Vascular: There is atherosclerotic calcification of the major vessels at the base of the brain. Skull: Negative Sinuses/Orbits: Clear/normal Other: None ASPECTS ([place] Stroke Program Early CT Score) - Ganglionic level infarction (caudate, lentiform nuclei, internal capsule, insula, M1-M3 cortex): 7 -  Supraganglionic infarction (M4-M6 cortex): 3 Total score (0-10 with 10 being normal): 10 IMPRESSION: 1. No sign of acute large vessel infarction. Chronic small-vessel ischemic changes throughout the brain, somewhat progressive since April of 2021. Cannot rule out the possibility of a more recent small vessel infarction. 2. ASPECTS is 10 3. These results were communicated to Dr. Cheral Marker at 6:04 pmon 5/2/2022by text page via the [facility] messaging system. Electronically Signed   By: Nelson Chimes M.D.   On: 12/27/2020 18:05   CT ANGIO HEAD CODE STROKE  Result Date: 12/27/2020 CLINICAL DATA:  Acute stroke presentation with slurred speech. EXAM: CT ANGIOGRAPHY HEAD AND NECK TECHNIQUE: Multidetector CT imaging of the head and neck was performed using the standard protocol during bolus administration of intravenous contrast. Multiplanar CT image reconstructions and MIPs were obtained to evaluate the vascular anatomy. Carotid stenosis measurements (when applicable) are obtained utilizing NASCET criteria, using the distal internal carotid diameter as the denominator. CONTRAST:  32mL OMNIPAQUE IOHEXOL 350 MG/ML SOLN COMPARISON:  Head CT same day FINDINGS: CTA NECK FINDINGS Aortic arch: Aortic atherosclerosis. Branching pattern is normal without origin stenosis. Right carotid system: Common carotid artery widely patent to the bifurcation. Soft and calcified plaque at the carotid bifurcation and ICA bulb. No stenosis beyond the diameter of the more distal cervical ICA. Left carotid system: Common carotid artery widely patent to the bifurcation. Mild soft plaque at the carotid bifurcation and ICA bulb but no stenosis beyond the diameter of the more distal cervical ICA. Vertebral arteries: Both vertebral artery origins are widely patent. Both vertebral arteries appear normal through the cervical region to the foramen magnum. Skeleton: Ordinary cervical spondylosis. Other neck: No mass or lymphadenopathy. Dilated fluid and  air-filled esophagus. Upper chest: Abnormal density in the lateral aspect of the right upper lobe with solid and sub solid components measuring approximately 2.8 cm. This has a worrisome appearance and could possibly be neoplastic or infectious. Review of the MIP images confirms the above findings CTA HEAD FINDINGS Anterior circulation: Both internal carotid arteries are patent through the skull base and siphon regions. Siphon atherosclerotic calcification but without stenosis greater than 30%. The anterior and middle cerebral vessels are patent. No large or medium vessel occlusion is seen. Distal vessel narrowing and atherosclerotic irregularity. Posterior circulation: Both vertebral arteries are patent to the basilar. The basilar is a small vessel. Both posterior cerebral arteries receive most of there supply from the anterior circulation. Distal vessel narrowing and irregularity within the PCA branches. Venous sinuses: Patent and normal. Anatomic variants: None significant. Review of the MIP images confirms the above findings IMPRESSION: No large or medium vessel occlusion. Atherosclerotic plaque at both carotid bifurcations but without stenosis. Intracranial distal branch vessel irregularity. Dilated fluid and air-filled esophagus. 2.8 cm solid and sub solid lesion in the lateral aspect of the right upper lobe. This could be pneumonia or, more concerning, a neoplastic lesion. Complete chest  CT suggested when able. This may require further workup. Electronically Signed   By: Nelson Chimes M.D.   On: 12/27/2020 18:27   CT ANGIO NECK CODE STROKE  Result Date: 12/27/2020 CLINICAL DATA:  Acute stroke presentation with slurred speech. EXAM: CT ANGIOGRAPHY HEAD AND NECK TECHNIQUE: Multidetector CT imaging of the head and neck was performed using the standard protocol during bolus administration of intravenous contrast. Multiplanar CT image reconstructions and MIPs were obtained to evaluate the vascular anatomy.  Carotid stenosis measurements (when applicable) are obtained utilizing NASCET criteria, using the distal internal carotid diameter as the denominator. CONTRAST:  78mL OMNIPAQUE IOHEXOL 350 MG/ML SOLN COMPARISON:  Head CT same day FINDINGS: CTA NECK FINDINGS Aortic arch: Aortic atherosclerosis. Branching pattern is normal without origin stenosis. Right carotid system: Common carotid artery widely patent to the bifurcation. Soft and calcified plaque at the carotid bifurcation and ICA bulb. No stenosis beyond the diameter of the more distal cervical ICA. Left carotid system: Common carotid artery widely patent to the bifurcation. Mild soft plaque at the carotid bifurcation and ICA bulb but no stenosis beyond the diameter of the more distal cervical ICA. Vertebral arteries: Both vertebral artery origins are widely patent. Both vertebral arteries appear normal through the cervical region to the foramen magnum. Skeleton: Ordinary cervical spondylosis. Other neck: No mass or lymphadenopathy. Dilated fluid and air-filled esophagus. Upper chest: Abnormal density in the lateral aspect of the right upper lobe with solid and sub solid components measuring approximately 2.8 cm. This has a worrisome appearance and could possibly be neoplastic or infectious. Review of the MIP images confirms the above findings CTA HEAD FINDINGS Anterior circulation: Both internal carotid arteries are patent through the skull base and siphon regions. Siphon atherosclerotic calcification but without stenosis greater than 30%. The anterior and middle cerebral vessels are patent. No large or medium vessel occlusion is seen. Distal vessel narrowing and atherosclerotic irregularity. Posterior circulation: Both vertebral arteries are patent to the basilar. The basilar is a small vessel. Both posterior cerebral arteries receive most of there supply from the anterior circulation. Distal vessel narrowing and irregularity within the PCA branches. Venous  sinuses: Patent and normal. Anatomic variants: None significant. Review of the MIP images confirms the above findings IMPRESSION: No large or medium vessel occlusion. Atherosclerotic plaque at both carotid bifurcations but without stenosis. Intracranial distal branch vessel irregularity. Dilated fluid and air-filled esophagus. 2.8 cm solid and sub solid lesion in the lateral aspect of the right upper lobe. This could be pneumonia or, more concerning, a neoplastic lesion. Complete chest CT suggested when able. This may require further workup. Electronically Signed   By: Nelson Chimes M.D.   On: 12/27/2020 18:27    PHYSICAL EXAM  Temp:  [98.3 F (36.8 C)-99 F (37.2 C)] 99 F (37.2 C) (05/03 1518) Pulse Rate:  [61-98] 85 (05/03 1518) Resp:  [11-28] 18 (05/03 1518) BP: (151-230)/(96-138) 199/122 (05/03 1518) SpO2:  [96 %-100 %] 100 % (05/03 1518) Weight:  [77 kg] 77 kg (05/03 0839)  General - Well nourished, well developed, in no apparent distress.  Ophthalmologic - fundi not visualized due to noncooperation.  Cardiovascular - Regular rhythm and rate.  Neuro - awake, alert, eyes open, orientated to age, place, time and people. No aphasia, however, mild dysarthria, following all simple commands. Able to name and repeat. No gaze palsy, tracking bilaterally, visual field full, PERRL. Left facial droop. Tongue midline. LUE contracture with 3-/5 proximal and 2/5 finger grip. LLE barely against gravity,  2+/5 proximal and distally. RUE at least 4/5 and RLE 3/5. Sensation decreased on the left at face arm ane leg, right FTN intact, gait not tested.    ASSESSMENT/PLAN Ms. Wilene Orlik is a 50 y.o. female with history of PE, hypertension, PVD, diabetes, HLD, obesity, migraine, COPD, CKD, history of stroke and ICH admitted for worsening slurred speech, and difficulty walking. No tPA given due to outside window and history of ICH.    Stroke:  left CR infarct, likely secondary to small vessel disease  source likely due to  Uncontrolled risk factors including HTN, HLD  CT head no acute abnormality  CT head and neck no LVO  MRI left CR acute infarct  2D Echo EF 60 to 65%  LDL 136  HgbA1c 6.6  UDS positive for THC  Lovenox for VTE prophylaxis  aspirin 81 mg daily prior to admission, now on aspirin 81 mg daily. Continue on discharge. No DAPT given hx of ICH and current uncontrolled HTN  Patient counseled to be compliant with her antithrombotic medications  Ongoing aggressive stroke risk factor management  Therapy recommendations:  CIR, need outpt referral to PMR for botox injection  Disposition:  pending  Diabetes  HgbA1c 6.6 goal < 7.0  Controlled  CBG monitoring  SSI  DM education and close PCP follow up  Hypertension . Unstable with high BP . Gradually normalized within 3-5 days.  Long term BP goal normotensive  Hyperlipidemia  Home meds:  lipitor 40   LDL 136, goal < 70  Now increased to Lipitor 80  Continue statin at discharge  Other Stroke Risk Factors  Obesity, Body mass index is 32.07 kg/m.   PVD  History of PE was on Xarelto, Xarelto discontinued after ICH  Substance abuse, UDS THC positive, cessation education provided  Other Active Problems  COPD  Hospital day # 1  Neurology will sign off. Please call with questions. Pt will follow up with stroke clinic NP at [facility] in about 4 weeks. Thanks for the consult.   Rosalin Hawking, MD PhD Stroke Neurology 12/28/2020 5:12 PM    To contact Stroke Continuity provider, please refer to [email or web address] After hours, contact General Neurology

## 2020-12-28 NOTE — Evaluation (Signed)
Occupational Therapy Evaluation Patient Details Name: Diamond Rice MRN: [id number] DOB: 1970/12/03 Today's Date: 12/28/2020    History of Present Illness Pt is a 50 y/o female admitted 5/2 secondary to increased difficulty ambulating and difficulty speaking. PMH includes CVA, DM2, HTN, HLD, smoking, prior cocaine abuse, PE (no longer on anticoagulation).   Clinical Impression   PTA, pt was recently staying at a friends house (her leasing having ended in April) and was independent with BADLs and using SPC for mobility. Pt currently presenting with decreased strength, balance, and functional performance. Pt with new weakness and poor coordination at RUE/RLE. Pt very motivated to participate in therapy. Pt would benefit from further acute OT to facilitate safe dc. Recommend dc to CIR for intensive OT to optimize safety, independence with ADLs, and return to PLOF.     Follow Up Recommendations  CIR    Equipment Recommendations  3 in 1 bedside commode;Wheelchair (measurements OT);Wheelchair cushion (measurements OT) (Defer to next venue)    Recommendations for Other Services PT consult     Precautions / Restrictions Precautions Precautions: Fall Restrictions Weight Bearing Restrictions: No      Mobility Bed Mobility Overal bed mobility: Needs Assistance Bed Mobility: Supine to Sit;Sit to Supine     Supine to sit: Min assist Sit to supine: Min assist   General bed mobility comments: Min A For steadying assist at trunk to come to sitting. Min A for LE assist for return to supine.    Transfers Overall transfer level: Needs assistance Equipment used: 2 person hand held assist Transfers: Sit to/from Stand Sit to Stand: Min assist;+2 physical assistance         General transfer comment: Min A +2 for lift assist and to help with standing fully upright. Increased time required.    Balance Overall balance assessment: Needs assistance Sitting-balance support: No upper  extremity supported;Feet supported Sitting balance-Leahy Scale: Fair     Standing balance support: Bilateral upper extremity supported;During functional activity Standing balance-Leahy Scale: Poor Standing balance comment: Reliant on UE and external support                           ADL either performed or assessed with clinical judgement   ADL Overall ADL's : Needs assistance/impaired Eating/Feeding: NPO   Grooming: Set up;Supervision/safety;Sitting   Upper Body Bathing: Minimal assistance;Sitting   Lower Body Bathing: Maximal assistance;Sit to/from stand;Moderate assistance Lower Body Bathing Details (indicate cue type and reason): Max A for standing balance Upper Body Dressing : Minimal assistance;Sitting Upper Body Dressing Details (indicate cue type and reason): Min A to don gown Lower Body Dressing: Minimal assistance;Maximal assistance;Sit to/from stand Lower Body Dressing Details (indicate cue type and reason): Min A for positioning BELs for donning socks. Max A for standing balance Toilet Transfer: Maximal assistance;+2 for physical assistance;+2 for safety/equipment (side steps towards [facility])           Functional mobility during ADLs: Maximal assistance;+2 for physical assistance (side steps) General ADL Comments: Pt presenting with residual L sided weakness and now has new right sided weakness.     Vision         Perception     Praxis      Pertinent Vitals/Pain Pain Assessment: No/denies pain     Hand Dominance Right   Extremity/Trunk Assessment Upper Extremity Assessment Upper Extremity Assessment: LUE deficits/detail;RUE deficits/detail RUE Deficits / Details: Decreased strength and limited shoulder ROM. shoulder AROM 0-80* and PROM [facility]  RUE Coordination: decreased gross motor LUE Deficits / Details: Baseline residual weakness. tendency for flexed position towards chest. Able to perform AROM at hand, wrist, and elbow. Limited shoulder  ROM. LUE Coordination: decreased fine motor;decreased gross motor   Lower Extremity Assessment Lower Extremity Assessment: Defer to PT evaluation RLE Deficits / Details: Grossly 2/5 in hip flexors and knee flexors. 3/5 at ankle musculature and knee extensors. LLE Deficits / Details: LLE weakness at baseline.   Cervical / Trunk Assessment Cervical / Trunk Assessment: Kyphotic   Communication Communication Communication: Expressive difficulties (dysarthria)   Cognition Arousal/Alertness: Awake/alert Behavior During Therapy: WFL for tasks assessed/performed Overall Cognitive Status: No family/caregiver present to determine baseline cognitive functioning                                 General Comments: Following cues and presenting with high motivation to participate. benefits from increased time for speech and processing   General Comments       Exercises     Shoulder Instructions      Home Living Family/patient expects to be discharged to:: Other (Comment)                                 Additional Comments: Pt reports her lease ended in April and she stayed at a friends house in May.      Prior Functioning/Environment Level of Independence: Needs assistance  Gait / Transfers Assistance Needed: Has been using quad cane to ambulate, but reports it has gotten more difficult in the last week. ADL's / Homemaking Assistance Needed: Reports she is independent with ADLs but takes increased time            OT Problem List: Decreased range of motion;Decreased strength;Decreased activity tolerance;Impaired balance (sitting and/or standing);Decreased coordination;Decreased safety awareness;Decreased cognition;Decreased knowledge of use of DME or AE;Decreased knowledge of precautions;Impaired UE functional use      OT Treatment/Interventions: Self-care/ADL training;Therapeutic exercise;Energy conservation;DME and/or AE instruction;Therapeutic  activities;Patient/family education    OT Goals(Current goals can be found in the care plan section) Acute Rehab OT Goals Patient Stated Goal: to regain independence OT Goal Formulation: With patient Time For Goal Achievement: 01/11/21 Potential to Achieve Goals: Good  OT Frequency: Min 2X/week   Barriers to D/C:            Co-evaluation   Reason for Co-Treatment: For patient/therapist safety;To address functional/ADL transfers PT goals addressed during session: Balance;Mobility/safety with mobility        AM-PAC OT "6 Clicks" Daily Activity     Outcome Measure Help from another person eating meals?: Total Help from another person taking care of personal grooming?: A Little Help from another person toileting, which includes using toliet, bedpan, or urinal?: A Lot Help from another person bathing (including washing, rinsing, drying)?: A Lot Help from another person to put on and taking off regular upper body clothing?: A Little Help from another person to put on and taking off regular lower body clothing?: A Lot 6 Click Score: 13   End of Session Equipment Utilized During Treatment: Gait belt Nurse Communication: Mobility status  Activity Tolerance: Patient tolerated treatment well Patient left: in bed;with call bell/phone within reach;with bed alarm set;with SCD's reapplied  OT Visit Diagnosis: Unsteadiness on feet (R26.81);Other abnormalities of gait and mobility (R26.89);Muscle weakness (generalized) (M62.81)  Time: 5638-9373 OT Time Calculation (min): 25 min Charges:  OT General Charges $OT Visit: 1 Visit OT Evaluation $OT Eval Moderate Complexity: 1 Mod  Tomasz Steeves MSOT, OTR/L Acute Rehab Pager: [phone] Office: [place] 12/28/2020, 1:13 PM

## 2020-12-28 NOTE — ED Notes (Addendum)
Patient c/o a headache. Provider made aware. Verbal order for fentanyl given to this RN from [place]. Will continue to monitor the patient at this time .

## 2020-12-28 NOTE — Progress Notes (Signed)
Modified Barium Swallow Progress Note  Patient Details  Name: Diamond Rice MRN: [id number] Date of Birth: 05/21/1971  Today's Date: 12/28/2020  Modified Barium Swallow completed.  Full report located under Chart Review in the Imaging Section.  Brief recommendations include the following:  Clinical Impression  Pt presents with a mild oral dysphagia with pharyngeal phase relatively WFL. She has intermittent oral holding with liquids, with mild lingual residue also noted primarily on her first few sips. She also has loss of regular solids to the pyriform sinuses while she is still masticating, but there is no aspiration across all consistencies. Recommend starting a diet consisting of regular solids and thin liquids. Given coughing noted during initial evaluation, will f/u briefly.   Swallow Evaluation Recommendations       SLP Diet Recommendations: Regular solids;Thin liquid   Liquid Administration via: Cup;Straw   Medication Administration: Whole meds with liquid   Supervision: Patient able to self feed;Intermittent supervision to cue for compensatory strategies   Compensations: Slow rate;Small sips/bites   Postural Changes: Seated upright at 90 degrees;Remain semi-upright after after feeds/meals (Comment)   Oral Care Recommendations: Oral care BID         Osie Bond., M.A. [place] [facility] Services Pager [phone] Office [phone]  12/28/2020,2:55 PM

## 2020-12-28 NOTE — Progress Notes (Signed)
  Echocardiogram 2D Echocardiogram has been performed.  Diamond Rice 12/28/2020, 10:21 AM

## 2020-12-28 NOTE — ED Notes (Signed)
Report given to Gaston Islam, RN of [phone]

## 2020-12-28 NOTE — Progress Notes (Signed)
PROGRESS NOTE    Diamond Rice  DGU:[id number] DOB: 1971-02-07 DOA: 12/27/2020 PCP: [name], MD   Brief Narrative:  HPI on 12/27/2020 by Dr. Jennette Kettle Diamond Rice is a 50 y.o. female with medical history significant of DM2, HTN, HLD, smoking, prior cocaine abuse, PE (no longer on anticoagulation), medication nonadhering.  Pt with h/o 4 prior strokes: 3 ischemic, 1 hemorrhagic.  Pt has chronic L sided deficits from the hemorrhagic stroke.  Most recent stroke was ischemic in April of 2021.  Today pt presents to ED with acute worsening of B leg pain and dysarthria.  LKW was 10am today.  Symptoms are constant, moderate, persistent.  Nothing makes symptoms better or worse.  EMS called, pt noted to be very hypertensive.  No fevers, chills.  Interim history Admitted for acute CVA.  Patient with slurred speech and left-sided weakness.  Pending stroke work-up.  Suspect she will need placement. Assessment & Plan   Acute ischemic stroke -Presented with dysarthria and inability to walk -Patient's had a history of prior strokes -CT head showed no acute large vessel infarction.  Chronic small vessel ischemic changes throughout the brain somewhat progressive since April 2021 -CTA head and neck showed no large or medium vessel occlusion. -MRI showed 4 to 5 mm acute infarction of the left hemispheric white matter in the upper parietal centrum semiovale region -Echocardiogram pending -LDL 136, hemoglobin A1c 6.6 -Continue statin, aspirin, will order Plavix -Pending speech, PT, OT evaluations -Neurology consulted and appreciated  Abnormal finding on lung imaging -CT head and neck showed 2.8 cm solid and some solid lesion in the lateral aspect of the right upper lobe -Will need complete set chest CT for further work-up however given the patient received contrast will wait 24 hours  Diabetes mellitus, type II -Currently n.p.o. given acute CVA -Pending speech  evaluation -hemoglobin A1c 6.6 -Continue insulin sliding scale every 4 hours  Essential hypertension -Allowing for permissive hypertension given acute CVA -Home medications held  Polysubstance abuse -Tox screen was positive for THC  Hyperlipidemia -Lipid panel shows total cholesterol 209, HDL 60, LDL is 136, triglycerides 63 -Continue statin  DVT Prophylaxis SCDs  Code Status: Full  Family Communication: None at bedside  Disposition Plan:  Status is: Inpatient  Remains inpatient appropriate because:Ongoing diagnostic testing needed not appropriate for outpatient work up and Inpatient level of care appropriate due to severity of illness   Dispo: The patient is from: Home              Anticipated d/c is to: TBD              Patient currently is not medically stable to d/c.   Difficult to place patient No  Consultants Neurology  Procedures  None  Antibiotics   Anti-infectives (From admission, onward)   None      Subjective:   Diamond Rice seen and examined today.  Patient continues to complain of left-sided weakness more so in the left leg.  Able to move the left upper extremity some.  Also feels that she is very thirsty but does not feel she can swallow.  Currently denies chest pain, shortness of breath, abdominal pain, nausea or vomiting, diarrhea or constipation, headache.   Objective:   Vitals:   12/28/20 0730 12/28/20 0745 12/28/20 0800 12/28/20 0839  BP: (!) 178/112 (!) 191/111 (!) 173/97 (!) 199/124  Pulse: 61 82 79 79  Resp: 11 15 18 18   Temp:    98.3 F (  36.8 C)  TempSrc:    Oral  SpO2: 100% 98% 100% 99%  Weight:    77 kg  Height:    5\' 1"  (1.549 m)   No intake or output data in the 24 hours ending 12/28/20 1000 Filed Weights   12/28/20 0839  Weight: 77 kg    Exam  General: Well developed, well nourished, NAD, appears stated age  HEENT: NCAT, PERRLA, EOMI, Anicteiec Sclera, mucous membranes moist.  Left facial droop   Neck:  Supple  Cardiovascular: S1 S2 auscultated, no rubs, murmurs or gallops. Regular rate and rhythm.  Respiratory: Clear to auscultation bilaterally with equal chest rise  Abdomen: Soft, nontender, nondistended, + bowel sounds  Extremities: warm dry without cyanosis clubbing or edema  Neuro: AAOx3, unable to move LLE.  LUE 3/5 strength.  Decreased sensation on the left lower extremity.  RUE/RLE strength 5/5.  Mildly slurred speech, left facial droop  Psych: appropriate mood and affect   Data Reviewed: I have personally reviewed following labs and imaging studies  CBC: Recent Labs  Lab 12/27/20 1749 12/27/20 1755  WBC 6.9  --   NEUTROABS 3.2  --   HGB 12.5 13.9  HCT 39.8 41.0  MCV 73.8*  --   PLT 264  --    Basic Metabolic Panel: Recent Labs  Lab 12/27/20 1749 12/27/20 1755  NA 138 140  K 3.5 3.5  CL 104 103  CO2 25  --   GLUCOSE 143* 143*  BUN 11 12  CREATININE 1.04* 0.90  CALCIUM 9.4  --    GFR: Estimated Creatinine Clearance: 71.8 mL/min (by C-G formula based on SCr of 0.9 mg/dL). Liver Function Tests: Recent Labs  Lab 12/27/20 1749  AST 11*  ALT 8  ALKPHOS 66  BILITOT 0.5  PROT 8.1  ALBUMIN 3.5   No results for input(s): LIPASE, AMYLASE in the last 168 hours. No results for input(s): AMMONIA in the last 168 hours. Coagulation Profile: Recent Labs  Lab 12/27/20 1749  INR 1.0   Cardiac Enzymes: No results for input(s): CKTOTAL, CKMB, CKMBINDEX, TROPONINI in the last 168 hours. BNP (last 3 results) No results for input(s): PROBNP in the last 8760 hours. HbA1C: Recent Labs    12/28/20 0349  HGBA1C 6.6*   CBG: Recent Labs  Lab 12/27/20 2140 12/27/20 2352 12/28/20 0346  GLUCAP 88 112* 121*   Lipid Profile: Recent Labs    12/28/20 0349  CHOL 209*  HDL 60  LDLCALC 136*  TRIG 63  CHOLHDL 3.5   Thyroid Function Tests: No results for input(s): TSH, T4TOTAL, FREET4, T3FREE, THYROIDAB in the last 72 hours. Anemia Panel: No results for  input(s): VITAMINB12, FOLATE, FERRITIN, TIBC, IRON, RETICCTPCT in the last 72 hours. Urine analysis:    Component Value Date/Time   COLORURINE STRAW (A) 12/27/2020 1930   APPEARANCEUR CLEAR 12/27/2020 1930   LABSPEC 1.023 12/27/2020 1930   PHURINE 8.0 12/27/2020 1930   GLUCOSEU 50 (A) 12/27/2020 1930   HGBUR NEGATIVE 12/27/2020 1930   BILIRUBINUR NEGATIVE 12/27/2020 1930   KETONESUR NEGATIVE 12/27/2020 1930   PROTEINUR 30 (A) 12/27/2020 1930   UROBILINOGEN 0.2 08/28/2014 1143   NITRITE NEGATIVE 12/27/2020 1930   LEUKOCYTESUR NEGATIVE 12/27/2020 1930   Sepsis Labs: @LABRCNTIP (procalcitonin:4,lacticidven:4)  ) Recent Results (from the past 240 hour(s))  Resp Panel by RT-PCR (Flu A&B, Covid) Nasopharyngeal Swab     Status: None   Collection Time: 12/27/20  5:49 PM   Specimen: Nasopharyngeal Swab; Nasopharyngeal(NP) swabs in vial transport  medium  Result Value Ref Range Status   SARS Coronavirus 2 by RT PCR NEGATIVE NEGATIVE Final    Comment: (NOTE) SARS-CoV-2 target nucleic acids are NOT DETECTED.  The SARS-CoV-2 RNA is generally detectable in upper respiratory specimens during the acute phase of infection. The lowest concentration of SARS-CoV-2 viral copies this assay can detect is 138 copies/mL. A negative result does not preclude SARS-Cov-2 infection and should not be used as the sole basis for treatment or other patient management decisions. A negative result may occur with  improper specimen collection/handling, submission of specimen other than nasopharyngeal swab, presence of viral mutation(s) within the areas targeted by this assay, and inadequate number of viral copies(<138 copies/mL). A negative result must be combined with clinical observations, patient history, and epidemiological information. The expected result is Negative.  Fact Sheet for Patients:  EntrepreneurPulse.com.au  Fact Sheet for Healthcare Providers:   IncredibleEmployment.be  This test is no t yet approved or cleared by the [place] FDA and  has been authorized for detection and/or diagnosis of SARS-CoV-2 by FDA under an Emergency Use Authorization (EUA). This EUA will remain  in effect (meaning this test can be used) for the duration of the COVID-19 declaration under Section 564(b)(1) of the Act, 21 U.S.C.section 360bbb-3(b)(1), unless the authorization is terminated  or revoked sooner.       Influenza A by PCR NEGATIVE NEGATIVE Final   Influenza B by PCR NEGATIVE NEGATIVE Final    Comment: (NOTE) The Xpert Xpress SARS-CoV-2/FLU/RSV plus assay is intended as an aid in the diagnosis of influenza from Nasopharyngeal swab specimens and should not be used as a sole basis for treatment. Nasal washings and aspirates are unacceptable for Xpert Xpress SARS-CoV-2/FLU/RSV testing.  Fact Sheet for Patients: EntrepreneurPulse.com.au  Fact Sheet for Healthcare Providers: IncredibleEmployment.be  This test is not yet approved or cleared by the [place] FDA and has been authorized for detection and/or diagnosis of SARS-CoV-2 by FDA under an Emergency Use Authorization (EUA). This EUA will remain in effect (meaning this test can be used) for the duration of the COVID-19 declaration under Section 564(b)(1) of the Act, 21 U.S.C. section 360bbb-3(b)(1), unless the authorization is terminated or revoked.  Performed at [facility], [place] [address] [place], [place] [address]       Radiology Studies: MR BRAIN WO CONTRAST  Result Date: 12/27/2020 CLINICAL DATA:  Acute stroke presentation with slurred speech. EXAM: MRI HEAD WITHOUT CONTRAST TECHNIQUE: Multiplanar, multiecho pulse sequences of the brain and surrounding structures were obtained without intravenous contrast. COMPARISON:  CT studies earlier same day.  MRI 12/06/2019. FINDINGS: Brain: Diffusion imaging shows a  4-5 mm acute infarction within the left upper centrum semiovale region. No other acute infarction. Chronic small-vessel ischemic changes affect pons. Old small vessel cerebellar infarctions. Old small vessel infarctions of the thalami and basal ganglia. Hemosiderin deposition in the right basal ganglia and radiating white matter tracts. Chronic small-vessel ischemic changes of the hemispheric white matter. No large vessel territory infarction. No mass lesion, acute hemorrhage, hydrocephalus or extra-axial collection. Vascular: Major vessels at the base of the brain show flow. Skull and upper cervical spine: Negative Sinuses/Orbits: Clear/normal Other: None IMPRESSION: 4-5 mm acute infarction of the left hemispheric white matter in the upper parietal centrum semiovale region. No large or cortical infarction. Extensive chronic ischemic changes elsewhere throughout the brain as outlined above. Electronically Signed   By: Nelson Chimes M.D.   On: 12/27/2020 20:50   DG Chest Port 1  View  Result Date: 12/27/2020 CLINICAL DATA:  Altered mental status, indistinct focal opacity on neck CT angiogram EXAM: PORTABLE CHEST 1 VIEW COMPARISON:  10/27/2020 chest radiograph. FINDINGS: Stable cardiomediastinal silhouette with mild cardiomegaly. No pneumothorax. No pleural effusion. Indistinct faint opacity in the apical right lung. Otherwise clear lungs with no overt pulmonary edema. IMPRESSION: Stable mild cardiomegaly without overt pulmonary edema. Indistinct faint opacity in the apical right lung, correlating to the site of mixed density opacity on the neck CT angiogram from earlier today. Short-term follow-up dedicated chest CT recommended when clinically feasible. Electronically Signed   By: Ilona Sorrel M.D.   On: 12/27/2020 19:11   CT HEAD CODE STROKE WO CONTRAST  Result Date: 12/27/2020 CLINICAL DATA:  Code stroke. Acute presentation with slurred speech. EXAM: CT HEAD WITHOUT CONTRAST TECHNIQUE: Contiguous axial images  were obtained from the base of the skull through the vertex without intravenous contrast. COMPARISON:  MRI 12/06/2019.  CT 12/06/2019 FINDINGS: Brain: No abnormality seen affecting the brainstem or cerebellum. 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 Chronic small-vessel ischemic changes are present throughout the hemispheric white matter. This appears progressive since last year. No definitely acute stroke. One could not rule out the possibility of a recent small vessel infarction. No sign of large vessel or cortical infarction Vascular: There is atherosclerotic calcification of the major vessels at the base of the brain. Skull: Negative Sinuses/Orbits: Clear/normal Other: None ASPECTS ([place] Stroke Program Early CT Score) - Ganglionic level infarction (caudate, lentiform nuclei, internal capsule, insula, M1-M3 cortex): 7 - Supraganglionic infarction (M4-M6 cortex): 3 Total score (0-10 with 10 being normal): 10 IMPRESSION: 1. No sign of acute large vessel infarction. Chronic small-vessel ischemic changes throughout the brain, somewhat progressive since April of 2021. Cannot rule out the possibility of a more recent small vessel infarction. 2. ASPECTS is 10 3. These results were communicated to Dr. Cheral Marker at 6:04 pmon 5/2/2022by text page via the [facility] messaging system. Electronically Signed   By: Nelson Chimes M.D.   On: 12/27/2020 18:05   CT ANGIO HEAD CODE STROKE  Result Date: 12/27/2020 CLINICAL DATA:  Acute stroke presentation with slurred speech. EXAM: CT ANGIOGRAPHY HEAD AND NECK TECHNIQUE: Multidetector CT imaging of the head and neck was performed using the standard protocol during bolus administration of intravenous contrast. Multiplanar CT image reconstructions and MIPs were obtained to evaluate the vascular anatomy. Carotid stenosis measurements (when applicable) are obtained utilizing NASCET criteria, using the distal internal carotid diameter as the denominator. CONTRAST:   13mL OMNIPAQUE IOHEXOL 350 MG/ML SOLN COMPARISON:  Head CT same day FINDINGS: CTA NECK FINDINGS Aortic arch: Aortic atherosclerosis. Branching pattern is normal without origin stenosis. Right carotid system: Common carotid artery widely patent to the bifurcation. Soft and calcified plaque at the carotid bifurcation and ICA bulb. No stenosis beyond the diameter of the more distal cervical ICA. Left carotid system: Common carotid artery widely patent to the bifurcation. Mild soft plaque at the carotid bifurcation and ICA bulb but no stenosis beyond the diameter of the more distal cervical ICA. Vertebral arteries: Both vertebral artery origins are widely patent. Both vertebral arteries appear normal through the cervical region to the foramen magnum. Skeleton: Ordinary cervical spondylosis. Other neck: No mass or lymphadenopathy. Dilated fluid and air-filled esophagus. Upper chest: Abnormal density in the lateral aspect of the right upper lobe with solid and sub solid components measuring approximately 2.8 cm. This has a worrisome appearance and could possibly be neoplastic or infectious. Review of  the MIP images confirms the above findings CTA HEAD FINDINGS Anterior circulation: Both internal carotid arteries are patent through the skull base and siphon regions. Siphon atherosclerotic calcification but without stenosis greater than 30%. The anterior and middle cerebral vessels are patent. No large or medium vessel occlusion is seen. Distal vessel narrowing and atherosclerotic irregularity. Posterior circulation: Both vertebral arteries are patent to the basilar. The basilar is a small vessel. Both posterior cerebral arteries receive most of there supply from the anterior circulation. Distal vessel narrowing and irregularity within the PCA branches. Venous sinuses: Patent and normal. Anatomic variants: None significant. Review of the MIP images confirms the above findings IMPRESSION: No large or medium vessel occlusion.  Atherosclerotic plaque at both carotid bifurcations but without stenosis. Intracranial distal branch vessel irregularity. Dilated fluid and air-filled esophagus. 2.8 cm solid and sub solid lesion in the lateral aspect of the right upper lobe. This could be pneumonia or, more concerning, a neoplastic lesion. Complete chest CT suggested when able. This may require further workup. Electronically Signed   By: Nelson Chimes M.D.   On: 12/27/2020 18:27   CT ANGIO NECK CODE STROKE  Result Date: 12/27/2020 CLINICAL DATA:  Acute stroke presentation with slurred speech. EXAM: CT ANGIOGRAPHY HEAD AND NECK TECHNIQUE: Multidetector CT imaging of the head and neck was performed using the standard protocol during bolus administration of intravenous contrast. Multiplanar CT image reconstructions and MIPs were obtained to evaluate the vascular anatomy. Carotid stenosis measurements (when applicable) are obtained utilizing NASCET criteria, using the distal internal carotid diameter as the denominator. CONTRAST:  56mL OMNIPAQUE IOHEXOL 350 MG/ML SOLN COMPARISON:  Head CT same day FINDINGS: CTA NECK FINDINGS Aortic arch: Aortic atherosclerosis. Branching pattern is normal without origin stenosis. Right carotid system: Common carotid artery widely patent to the bifurcation. Soft and calcified plaque at the carotid bifurcation and ICA bulb. No stenosis beyond the diameter of the more distal cervical ICA. Left carotid system: Common carotid artery widely patent to the bifurcation. Mild soft plaque at the carotid bifurcation and ICA bulb but no stenosis beyond the diameter of the more distal cervical ICA. Vertebral arteries: Both vertebral artery origins are widely patent. Both vertebral arteries appear normal through the cervical region to the foramen magnum. Skeleton: Ordinary cervical spondylosis. Other neck: No mass or lymphadenopathy. Dilated fluid and air-filled esophagus. Upper chest: Abnormal density in the lateral aspect of the  right upper lobe with solid and sub solid components measuring approximately 2.8 cm. This has a worrisome appearance and could possibly be neoplastic or infectious. Review of the MIP images confirms the above findings CTA HEAD FINDINGS Anterior circulation: Both internal carotid arteries are patent through the skull base and siphon regions. Siphon atherosclerotic calcification but without stenosis greater than 30%. The anterior and middle cerebral vessels are patent. No large or medium vessel occlusion is seen. Distal vessel narrowing and atherosclerotic irregularity. Posterior circulation: Both vertebral arteries are patent to the basilar. The basilar is a small vessel. Both posterior cerebral arteries receive most of there supply from the anterior circulation. Distal vessel narrowing and irregularity within the PCA branches. Venous sinuses: Patent and normal. Anatomic variants: None significant. Review of the MIP images confirms the above findings IMPRESSION: No large or medium vessel occlusion. Atherosclerotic plaque at both carotid bifurcations but without stenosis. Intracranial distal branch vessel irregularity. Dilated fluid and air-filled esophagus. 2.8 cm solid and sub solid lesion in the lateral aspect of the right upper lobe. This could be pneumonia or, more concerning,  a neoplastic lesion. Complete chest CT suggested when able. This may require further workup. Electronically Signed   By: Nelson Chimes M.D.   On: 12/27/2020 18:27     Scheduled Meds: . acetaminophen  650 mg Oral Once  . ARIPiprazole  5 mg Oral Daily  . aspirin  81 mg Oral Daily  . atorvastatin  80 mg Oral q1800  . insulin aspart  0-9 Units Subcutaneous Q4H  . mirtazapine  15 mg Oral QHS   Continuous Infusions:   LOS: 1 day   Time Spent in minutes   45 minutes  Roxann Vierra D.O. on 12/28/2020 at 10:00 AM  Between 7am to 7pm - Please see pager noted on [email or web address]  After 7pm go to [email or web address]  And look for the night  coverage person covering for me after hours  [facility] Office  [phone]

## 2020-12-28 NOTE — Evaluation (Signed)
Speech Language Pathology Evaluation Patient Details Name: Diamond Rice MRN: [id number] DOB: 01-18-1971 Today's Date: 12/28/2020 Time: 1310-1320 SLP Time Calculation (min) (ACUTE ONLY): 10 min  Problem List:  Patient Active Problem List   Diagnosis Date Noted  . Acute ischemic stroke ([place]) 12/27/2020  . Abnormal finding on lung imaging 12/27/2020  . Severe episode of recurrent major depressive disorder, with psychotic features ([place])   . Acute CVA (cerebrovascular accident) ([place]) 12/06/2019  . AKI (acute kidney injury) ([place]) 12/06/2019  . LVH (left ventricular hypertrophy) 08/10/2019  . Edema of lower extremity 11/26/2018  . Loss of appetite 11/26/2018  . Urinary incontinence 11/26/2018  . Allergic rhinitis 11/14/2018  . Deep venous thrombosis ([place]) 11/14/2018  . Nicotine dependence 11/14/2018  . Hemorrhagic stroke ([place])   . Diastolic dysfunction   . Type 2 diabetes mellitus ([place])   . Anemia of chronic disease   . Chronic obstructive pulmonary disease ([place])   . ICH (intracerebral hemorrhage) ([place]) 08/30/2018  . Hypertensive heart disease without heart failure 07/04/2018  . Nonrheumatic aortic valve insufficiency 07/04/2018  . Esophageal abnormality   . Class 2 severe obesity due to excess calories with serious comorbidity and body mass index (BMI) of 35.0 to 35.9 in adult [facility])   . Peripheral vascular disease ([place])   . History of osteomyelitis   . History of amputation of lesser toe ([place])   . Pulmonary embolism ([place]) 06/22/2018  . Microcytic anemia 06/17/2018  . At risk for adverse drug reaction 06/11/2018  . Critical lower limb ischemia ([place]) 05/27/2018  . GI bleeding 04/02/2018  . Acute renal failure (ARF) ([place]) 04/02/2018  . Hypokalemia 04/02/2018  . Polysubstance abuse ([place]) 04/02/2018  . Diabetic foot ulcer ([place]) 04/02/2018  . Tinea pedis 03/07/2018  . Diabetic peripheral neuropathy ([place]) 01/24/2018  . Cannabis use disorder, moderate, dependence ([place]) 05/19/2017   . Cocaine use disorder, moderate, dependence ([place]) 05/19/2017  . CKD (chronic kidney disease) stage 3, GFR 30-59 ml/min (HCC) 02/17/2017  . Noncompliance with medications 02/17/2017  . Suicide attempt ([place]) 11/06/2016  . Mild cognitive impairment 03/21/2016  . Moderate episode of recurrent major depressive disorder ([place]) 03/21/2016  . B12 deficiency 03/09/2016  . Iron deficiency anemia 03/07/2016  . History of CVA with residual deficit 12/30/2015  . Schizoaffective disorder, bipolar type ([place]) 10/07/2015  . OSA (obstructive sleep apnea) 06/09/2015  . Vitamin D deficiency 01/06/2015  . Bilateral knee pain 01/05/2015  . Numbness and tingling in right hand 01/05/2015  . Insomnia 12/07/2014  . Right-sided lacunar infarction ([place]) 09/15/2014  . Left hemiparesis ([place]) 09/15/2014  . Dyspnea   . Back pain 09/11/2013  . Psychoactive substance-induced organic mood disorder ([place]) 05/28/2013  . Cocaine abuse with cocaine-induced mood disorder ([place]) 05/28/2013  . Cannabis dependence with cannabis-induced anxiety disorder ([place]) 05/28/2013  . Morbid obesity with BMI of 45.0-49.9, adult ([place]) 04/25/2012  . Chest pain 04/17/2012  . Hypertension 04/17/2012  . DM (diabetes mellitus) with peripheral vascular complication ([place]) 74/25/9563  . Hyperlipidemia 04/17/2012  . Tobacco use 04/17/2012   Past Medical History:  Past Medical History:  Diagnosis Date  . Anxiety   . Chronic lower back pain 04/17/2012   "just got over some; catched when I walked"  . COPD (chronic obstructive pulmonary disease) ([place])   . Critical lower limb ischemia ([place]) 05/27/2018  . Depression   . Headache(784.0) 04/17/2012   "~ qod; lately waking up in am w/one"  . High cholesterol   . Hypertension   . Migraines  04/17/2012  . Obesity   . Sleep apnea    CPAP  . Stroke ([place])   . Type II diabetes mellitus ([place]) 08/28/2002   Past Surgical History:  Past Surgical History:  Procedure Laterality Date  . ABDOMINAL AORTOGRAM  W/LOWER EXTREMITY Left 05/27/2018   Procedure: ABDOMINAL AORTOGRAM W/LOWER EXTREMITY Runoff and Possible Intervention;  Surgeon: Waynetta Sandy, MD;  Location: [facility];  Service: Cardiovascular;  Laterality: Left;  . APPLICATION OF WOUND VAC Left 06/06/2018   Procedure: APPLICATION OF WOUND VAC;  Surgeon: Waynetta Sandy, MD;  Location: [facility];  Service: Vascular;  Laterality: Left;  . BIOPSY  04/04/2018   Procedure: BIOPSY;  Surgeon: [name], MD;  Location: [facility] ENDOSCOPY;  Service: Endoscopy;;  . BIOPSY  10/27/2020   Procedure: BIOPSY;  Surgeon: Arta Silence, MD;  Location: [facility] ENDOSCOPY;  Service: Endoscopy;;  . CARDIAC CATHETERIZATION  ~ 2011  . COLONOSCOPY N/A 04/04/2018   Procedure: COLONOSCOPY;  Surgeon: [name], MD;  Location[facility] ENDOSCOPY;  Service: Endoscopy;  Laterality: N/A;  . COLONOSCOPY WITH PROPOFOL N/A 10/27/2020   Procedure: COLONOSCOPY WITH PROPOFOL;  Surgeon: Arta Silence, MD;  Location: [facility] ENDOSCOPY;  Service: Endoscopy;  Laterality: N/A;  . ESOPHAGOGASTRODUODENOSCOPY (EGD) WITH PROPOFOL N/A 10/27/2020   Procedure: ESOPHAGOGASTRODUODENOSCOPY (EGD) WITH PROPOFOL  WITH POSSIBLE DIL;  Surgeon: Arta Silence, MD;  Location: [facility] ENDOSCOPY;  Service: Endoscopy;  Laterality: N/A;  . LOWER EXTREMITY ANGIOGRAM Left 05/27/2018  . PERIPHERAL VASCULAR INTERVENTION Left 05/27/2018   Procedure: PERIPHERAL VASCULAR INTERVENTION;  Surgeon: Waynetta Sandy, MD;  Location: [facility];  Service: Cardiovascular;  Laterality: Left;  . POLYPECTOMY  04/04/2018   Procedure: POLYPECTOMY;  Surgeon: [name], MD;  Location: [facility] ENDOSCOPY;  Service: Endoscopy;;  . POLYPECTOMY  10/27/2020   Procedure: POLYPECTOMY;  Surgeon: Arta Silence, MD;  Location: [facility] ENDOSCOPY;  Service: Endoscopy;;  . TRANSMETATARSAL AMPUTATION Left 05/28/2018   Procedure: AMPUTATION TOES THREE, FOUR AND FIVE on left foot;  Surgeon: Waynetta Sandy, MD;  Location:  [facility];  Service: Vascular;  Laterality: Left;  . WOUND DEBRIDEMENT Left 06/06/2018   Procedure: DEBRIDEMENT WOUND LEFT FOOT;  Surgeon: Waynetta Sandy, MD;  Location[facility];  Service: Vascular;  Laterality: Left;   HPI:  Pt is a 50 yo female with a h/o 4 prior strokes presents wtih an acute worsening of BLE pain and dysarthria. MRI showed an acute infarction of the L hemispheric white matter in the upper parietal centrum semiovale region. PMH also includes: DM2, HTN, HLD, smoking, prior cocaine abuse, PE, medication nonadhering. Most recent MBS 12/08/19 revealed aspiration of thin liquids (PAS 7, 8) with recs for Dys 2/Nectar. She reports advancing to regular solids/thin liquids at SNF after FEES. Pt also had dysarthria at that time impacting word to short phrase level.   Assessment / Plan / Recommendation Clinical Impression  Pt has a mild dysarthria that is improved compared to initial evaluation by SLP after her last stroke, but potentially mildly worse compared to how she has been communicating at home PTA. With extra time, pt is mostly intelligible at the conversational level, but still impacted by decreased breath support and reduced articulation. Pt apears to have appropriate comprehension and sustained attention with only mild difficulties noted with short-term, delayed recall. Question impaired emergent awareness, as pt at times has difficulty comparing current performance with baseline. Will f/u to maximize speech and provide ongoing assessment of higher level cognitive abilities.    SLP Assessment  SLP Recommendation/Assessment: Patient needs continued Speech Lanaguage Pathology Services SLP Visit Diagnosis: Dysarthria and anarthria (R47.1)    Follow Up Recommendations  Inpatient Rehab    Frequency and Duration min 2x/week  2 weeks      SLP Evaluation Cognition  Overall Cognitive Status: No family/caregiver present to determine baseline cognitive  functioning Arousal/Alertness: Awake/alert Orientation Level: Oriented X4 Attention: Sustained Sustained Attention: Appears intact Memory: Impaired Memory Impairment: Decreased short term memory Decreased Short Term Memory: Verbal basic Awareness: Impaired Awareness Impairment: Emergent impairment Problem Solving: Appears intact Safety/Judgment: Appears intact       Comprehension  Auditory Comprehension Overall Auditory Comprehension: Appears within functional limits for tasks assessed    Expression Expression Primary Mode of Expression: Verbal Verbal Expression Overall Verbal Expression: Appears within functional limits for tasks assessed Written Expression Dominant Hand: Right   Oral / Motor  Oral Motor/Sensory Function Overall Oral Motor/Sensory Function: Mild impairment Facial ROM: Reduced left;Suspected CN VII (facial) dysfunction;Other (Comment) (baseline per pt) Facial Symmetry: Abnormal symmetry left;Suspected CN VII (facial) dysfunction (baseline per pt) Facial Strength: Reduced left;Suspected CN VII (facial) dysfunction (baseline per pt) Lingual ROM: Within Functional Limits Lingual Symmetry: Within Functional Limits Lingual Strength: Within Functional Limits Velum:  (difficult to visualize) Mandible: Within Functional Limits Motor Speech Overall Motor Speech: Impaired Respiration: Impaired Level of Impairment: Phrase Phonation: Normal Resonance: Within functional limits Articulation: Impaired Level of Impairment: Conversation Intelligibility: Intelligible Motor Planning: Witnin functional limits Interfering Components: Premorbid status Effective Techniques: Slow rate   GO                    Osie Bond., M.A. [place] Acute Rehabilitation Services Pager [phone] Office [phone]  12/28/2020, 2:32 PM

## 2020-12-28 NOTE — Evaluation (Signed)
Physical Therapy Evaluation Patient Details Name: Diamond Rice MRN: [id number] DOB: [birth year] Today's Date: 12/28/2020   History of Present Illness  Pt is a 50 y/o female admitted 5/2 secondary to increased difficulty ambulating and difficulty speaking. PMH includes CVA, DM2, HTN, HLD, smoking, prior cocaine abuse, PE (no longer on anticoagulation).  Clinical Impression  Pt admitted secondary to problem above with deficits below. Pt with L sided weakness at baseline, but presenting with new RLE weakness. Pt with difficulty weight shifting and taking steps this session. Required min A +2 to stand and mod A +2 to take side steps at EOB. Pt previously at a mod I level with quad cane. Recommending CIR level therapies at d/c to increase independence and safety with mobility. Will continue to follow acutely.     Follow Up Recommendations CIR    Equipment Recommendations  Other (comment) (TBD)    Recommendations for Other Services Rehab consult     Precautions / Restrictions Precautions Precautions: Fall Restrictions Weight Bearing Restrictions: No      Mobility  Bed Mobility Overal bed mobility: Needs Assistance Bed Mobility: Supine to Sit;Sit to Supine     Supine to sit: Min assist Sit to supine: Min assist   General bed mobility comments: Min A For steadying assist at trunk to come to sitting. Min A for LE assist for return to supine.    Transfers Overall transfer level: Needs assistance Equipment used: 2 person hand held assist Transfers: Sit to/from Stand Sit to Stand: Min assist;+2 physical assistance         General transfer comment: Min A +2 for lift assist and to help with standing fully upright. Increased time required.  Ambulation/Gait Ambulation/Gait assistance: Mod assist;+2 physical assistance   Assistive device: 2 person hand held assist       General Gait Details: Took side steps at EOB. Pt with difficulty weightshifting onto RLE secondary to  increased weakness. REquired manual assist to weightshift and assist to move LLE. Mod A +2 for steadying.  Stairs            Wheelchair Mobility    Modified Rankin (Stroke Patients Only)       Balance Overall balance assessment: Needs assistance Sitting-balance support: No upper extremity supported;Feet supported Sitting balance-Leahy Scale: Fair     Standing balance support: Bilateral upper extremity supported;During functional activity Standing balance-Leahy Scale: Poor Standing balance comment: Reliant on UE and external support                             Pertinent Vitals/Pain Pain Assessment: No/denies pain    Home Living Family/patient expects to be discharged to:: Other (Comment)                 Additional Comments: Pt reports her lease ended in April and she stayed at a friends house in May.    Prior Function Level of Independence: Needs assistance   Gait / Transfers Assistance Needed: Has been using quad cane to ambulate, but reports it has gotten more difficult in the last week.  ADL's / Homemaking Assistance Needed: Reports she is independent with ADLs but takes increased time        Hand Dominance        Extremity/Trunk Assessment   Upper Extremity Assessment Upper Extremity Assessment: Defer to OT evaluation    Lower Extremity Assessment Lower Extremity Assessment: LLE deficits/detail;RLE deficits/detail RLE Deficits / Details: Grossly 2/5 in  hip flexors and knee flexors. 3/5 at ankle musculature and knee extensors. LLE Deficits / Details: LLE weakness at baseline.    Cervical / Trunk Assessment Cervical / Trunk Assessment: Kyphotic  Communication   Communication: Expressive difficulties (dysarthria)  Cognition Arousal/Alertness: Awake/alert Behavior During Therapy: WFL for tasks assessed/performed Overall Cognitive Status: No family/caregiver present to determine baseline cognitive functioning                                         General Comments      Exercises     Assessment/Plan    PT Assessment Patient needs continued PT services  PT Problem List Decreased strength;Decreased balance;Decreased activity tolerance;Decreased mobility;Decreased knowledge of use of DME;Decreased knowledge of precautions       PT Treatment Interventions DME instruction;Gait training;Therapeutic activities;Functional mobility training;Therapeutic exercise;Balance training;Patient/family education;Neuromuscular re-education    PT Goals (Current goals can be found in the Care Plan section)  Acute Rehab PT Goals Patient Stated Goal: to regain independence PT Goal Formulation: With patient Time For Goal Achievement: 01/11/21 Potential to Achieve Goals: Good    Frequency Min 4X/week   Barriers to discharge        Co-evaluation PT/OT/SLP Co-Evaluation/Treatment: Yes Reason for Co-Treatment: For patient/therapist safety;To address functional/ADL transfers PT goals addressed during session: Balance;Mobility/safety with mobility         AM-PAC PT "6 Clicks" Mobility  Outcome Measure Help needed turning from your back to your side while in a flat bed without using bedrails?: A Little Help needed moving from lying on your back to sitting on the side of a flat bed without using bedrails?: A Little Help needed moving to and from a bed to a chair (including a wheelchair)?: Total Help needed standing up from a chair using your arms (e.g., wheelchair or bedside chair)?: A Lot Help needed to walk in hospital room?: Total Help needed climbing 3-5 steps with a railing? : Total 6 Click Score: 11    End of Session Equipment Utilized During Treatment: Gait belt Activity Tolerance: Patient tolerated treatment well Patient left: in bed;with call bell/phone within reach;with bed alarm set Nurse Communication: Mobility status PT Visit Diagnosis: Unsteadiness on feet (R26.81);Muscle weakness (generalized)  (M62.81);Difficulty in walking, not elsewhere classified (R26.2)    Time: 5993-5701 PT Time Calculation (min) (ACUTE ONLY): 25 min   Charges:   PT Evaluation $PT Eval Moderate Complexity: 1 Mod          Reuel Derby, PT, DPT  Acute Rehabilitation Services  Pager: [phone] Office: [phone]   Rudean Hitt 12/28/2020, 12:44 PM

## 2020-12-28 NOTE — Consult Note (Signed)
Physical Medicine and Rehabilitation Consult Reason for Consult: Dysarthria with gait disturbance Referring Physician: Dr.Mikhali   HPI: Diamond Rice is a 50 y.o. 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  Independent with ADLs.  Presented 12/27/2020 with acute onset of dysarthria with gait disturbance.  Blood pressure 226/138.  CT/MRI showed a 4-5 mm acute infarction of the left hemispheric white matter in the upper parietal centrum semiovale region.  No large or cortical infarct.  CT angiogram head and neck no signs of acute large vessel infarction.  Echocardiogram with ejection fraction of 60 to 65% no wall motion abnormalities grade 1 diastolic dysfunction.  Admission chemistries unremarkable except creatinine 1.04, glucose 143, urine drug screen positive marijuana.  Currently maintained on aspirin for CVA prophylaxis.  Therapy evaluations completed due to patient decreased functional mobility recommendations of physical medicine rehab consult.   Pt reports LUE has been tight- spastic and cannot "pull it down easily" since first stroke-  Is upset because this latest stroke has made it impossible to walk- used to be able to walk with Assistive device- now LLE won't work- although was told she's weak on R side, she feels L side is also worse.  Denies pain, except tightness of LUE- chronic and when tries to stand, hurts LLE. .  Now just got cleared for regular diet- no BM since admission, but had been NPO.  Peeing OK.   Review of Systems  Constitutional: Negative for chills and fever.  HENT: Negative for hearing loss.   Eyes: Negative for blurred vision and double vision.  Respiratory:  Positive for shortness of breath. Negative for cough.   Cardiovascular: Positive for leg swelling. Negative for chest pain and palpitations.  Gastrointestinal: Positive for constipation. Negative for heartburn, nausea and vomiting.  Genitourinary: Negative for dysuria, flank pain and hematuria.  Musculoskeletal: Positive for back pain, joint pain and myalgias.  Skin: Negative for rash.  Neurological: Positive for speech change, weakness and headaches.  All other systems reviewed and are negative.  Past Medical History:  Diagnosis Date  . Anxiety   . Chronic lower back pain 04/17/2012   "just got over some; catched when I walked"  . COPD (chronic obstructive pulmonary disease) ([place])   . Critical lower limb ischemia ([place]) 05/27/2018  . Depression   . Headache(784.0) 04/17/2012   "~ qod; lately waking up in am w/one"  . High cholesterol   . Hypertension   . Migraines 04/17/2012  . Obesity   . Sleep apnea    CPAP  . Stroke ([place])   . Type II diabetes mellitus ([place]) 08/28/2002   Past Surgical History:  Procedure Laterality Date  . ABDOMINAL AORTOGRAM W/LOWER EXTREMITY Left 05/27/2018   Procedure: ABDOMINAL AORTOGRAM W/LOWER EXTREMITY Runoff and Possible Intervention;  Surgeon: Waynetta Sandy, MD;  Location: [place] [facility];  Service: Cardiovascular;  Laterality: Left;  . APPLICATION OF WOUND VAC Left 06/06/2018   Procedure: APPLICATION OF WOUND VAC;  Surgeon: Waynetta Sandy, MD;  Location: [facility];  Service: Vascular;  Laterality: Left;  . BIOPSY  04/04/2018   Procedure: BIOPSY;  Surgeon: [name], MD;  Location: [facility] ENDOSCOPY;  Service: Endoscopy;;  . BIOPSY  10/27/2020   Procedure: BIOPSY;  Surgeon: Arta Silence, MD;  Location: [facility] ENDOSCOPY;  Service: Endoscopy;;  . CARDIAC CATHETERIZATION  ~ 2011  . COLONOSCOPY N/A 04/04/2018   Procedure: COLONOSCOPY;  Surgeon: [name], MD;  Location: [facility] ENDOSCOPY;  Service: Endoscopy;  Laterality: N/A;  . COLONOSCOPY  WITH PROPOFOL N/A 10/27/2020   Procedure: COLONOSCOPY WITH PROPOFOL;  Surgeon: Arta Silence, MD;  Location: [facility] ENDOSCOPY;  Service: Endoscopy;  Laterality: N/A;  . ESOPHAGOGASTRODUODENOSCOPY (EGD) WITH PROPOFOL N/A 10/27/2020   Procedure: ESOPHAGOGASTRODUODENOSCOPY (EGD) WITH PROPOFOL  WITH POSSIBLE DIL;  Surgeon: Arta Silence, MD;  Location: [facility]OS[facility];  Service: Endoscopy;  Laterality: N/A;  . LOWER EXTREMITY ANGIOGRAM Left 05/27/2018  . PERIPHERAL VASCULAR INTERVENTION Left 05/27/2018   Procedure: PERIPHERAL VASCULAR INTERVENTION;  Surgeon: Waynetta Sandy, MD;  Location: [place] [facility];  Service: Cardiovascular;  Laterality: Left;  . POLYPECTOMY  04/04/2018   Procedure: POLYPECTOMY;  Surgeon: [name], MD;  Location: [facility] ENDOSCOPY;  Service: Endoscopy;;  . POLYPECTOMY  10/27/2020   Procedure: POLYPECTOMY;  Surgeon: Arta Silence, MD;  Location: [facility] ENDOS[facility];  Service: Endoscopy;;  . TRANSMETATARSAL AMPUTATION Left 05/28/2018   Procedure: AMPUTATION TOES THREE, FOUR AND FIVE on left foot;  Surgeon: Waynetta Sandy, MD;  Location: [place];  Service: Vascular;  Laterality: Left;  . WOUND DEBRIDEMENT Left 06/06/2018   Procedure: DEBRIDEMENT WOUND LEFT FOOT;  Surgeon: Waynetta Sandy, MD;  Location: [facility] OR;  Service: Vascular;  Laterality: Left;   Family History  Problem Relation Age of Onset  . Stroke Brother 64  . Stroke Brother 36  . Heart attack Mother 48  . Stroke Father 82   Social History:  reports that she has quit smoking. Her smoking use included cigarettes. She has a 7.00 pack-year smoking history. She has never used smokeless tobacco. She reports current drug use. Drugs: Marijuana and "Crack" cocaine. She reports that she does not drink alcohol. Allergies:  Allergies  Allergen Reactions  . Morphine And Related Hives  . Metformin Diarrhea and Other (See Comments)    "Allergic," per [facility]   Medications Prior to Admission  Medication Sig  Dispense Refill  . amLODipine (NORVASC) 10 MG tablet Take 1 tablet (10 mg total) by mouth daily. 30 tablet 0  . ARIPiprazole (ABILIFY) 5 MG tablet Take 5 mg by mouth daily.    . cloNIDine (CATAPRES) 0.3 MG tablet Take 1 tablet (0.3 mg total) by mouth 2 (two) times daily. 60 tablet 0  . hydrALAZINE (APRESOLINE) 100 MG tablet Take 1 tablet (100 mg total) by mouth 3 (three) times daily. 90 tablet 0  . insulin detemir (LEVEMIR) 100 UNIT/ML FlexPen Inject 15 Units into the skin 2 (two) times daily. 15 mL 0  . lisinopril (ZESTRIL) 40 MG tablet Take 1 tablet (40 mg total) by mouth daily. 30 tablet 0  . metoprolol succinate (TOPROL-XL) 50 MG 24 hr tablet Take 50 mg by mouth every morning.    . mirtazapine (REMERON) 15 MG tablet Take 1 tablet (15 mg total) by mouth at bedtime. For Depression 30 tablet 0  . ACCU-CHEK GUIDE test strip 2 (two) times daily. as directed    . Accu-Chek Softclix Lancets lancets 2 (two) times daily.    Marland Kitchen aspirin EC 81 MG EC tablet Take 1 tablet (81 mg total) by mouth daily. (Patient not taking: No sig reported)    . atorvastatin (LIPITOR) 40 MG tablet Take 1 tablet (40 mg total) by mouth daily at 6 PM. (Patient not taking: No sig reported)    . Blood Glucose  Monitoring Suppl (ACCU-CHEK GUIDE) w/Device KIT USE AS DIRECTED TO CHECK BLOOD SUGAR      Home: Home Living Family/patient expects to be discharged to:: Other (Comment) Additional Comments: Pt reports her lease ended in April and she stayed at a friends house in May.  Functional History: Prior Function Level of Independence: Needs assistance Gait / Transfers Assistance Needed: Has been using quad cane to ambulate, but reports it has gotten more difficult in the last week. ADL's / Homemaking Assistance Needed: Reports she is independent with ADLs but takes increased time Functional Status:  Mobility: Bed Mobility Overal bed mobility: Needs Assistance Bed Mobility: Supine to Sit,Sit to Supine Supine to sit: Min  assist Sit to supine: Min assist General bed mobility comments: Min A For steadying assist at trunk to come to sitting. Min A for LE assist for return to supine. Transfers Overall transfer level: Needs assistance Equipment used: 2 person hand held assist Transfers: Sit to/from Stand Sit to Stand: Min assist,+2 physical assistance General transfer comment: Min A +2 for lift assist and to help with standing fully upright. Increased time required. Ambulation/Gait Ambulation/Gait assistance: Mod assist,+2 physical assistance Assistive device: 2 person hand held assist General Gait Details: Took side steps at EOB. Pt with difficulty weightshifting onto RLE secondary to increased weakness. REquired manual assist to weightshift and assist to move LLE. Mod A +2 for steadying.    ADL: ADL Overall ADL's : Needs assistance/impaired Eating/Feeding: NPO Grooming: Set up,Supervision/safety,Sitting Upper Body Bathing: Minimal assistance,Sitting Lower Body Bathing: Maximal assistance,Sit to/from stand,Moderate assistance Lower Body Bathing Details (indicate cue type and reason): Max A for standing balance Upper Body Dressing : Minimal assistance,Sitting Upper Body Dressing Details (indicate cue type and reason): Min A to don gown Lower Body Dressing: Minimal assistance,Maximal assistance,Sit to/from stand Lower Body Dressing Details (indicate cue type and reason): Min A for positioning BELs for donning socks. Max A for standing balance Toilet Transfer: Maximal assistance,+2 for physical assistance,+2 for safety/equipment (side steps towards [facility]) Functional mobility during ADLs: Maximal assistance,+2 for physical assistance (side steps) General ADL Comments: Pt presenting with residual L sided weakness and now has new right sided weakness.  Cognition: Cognition Overall Cognitive Status: No family/caregiver present to determine baseline cognitive functioning Orientation Level: Oriented  X4 Cognition Arousal/Alertness: Awake/alert Behavior During Therapy: WFL for tasks assessed/performed Overall Cognitive Status: No family/caregiver present to determine baseline cognitive functioning General Comments: Following cues and presenting with high motivation to participate. benefits from increased time for speech and processing  Blood pressure (!) 186/112, pulse 78, temperature 98.4 F (36.9 C), temperature source Oral, resp. rate 18, height 5\' 1"  (1.549 m), weight 77 kg, last menstrual period 04/26/2018, SpO2 100 %. Physical Exam Vitals and nursing note reviewed.  Constitutional:      Comments: Pt awake, alert, sitting up in bed; eating lunch (3pm)- since just got cleared- on phone; LUE pulled to chest, NAD  HENT:     Head: Normocephalic and atraumatic.     Comments: L facial droop noted    Right Ear: External ear normal.     Left Ear: External ear normal.     Nose: Nose normal. No congestion.     Mouth/Throat:     Mouth: Mucous membranes are moist.     Pharynx: Oropharynx is clear. No oropharyngeal exudate.  Eyes:     General:        Right eye: No discharge.        Left eye: No discharge.  Extraocular Movements: Extraocular movements intact.  Cardiovascular:     Rate and Rhythm: Normal rate and regular rhythm.     Heart sounds: Normal heart sounds. No murmur heard. No gallop.   Pulmonary:     Comments: CTA B/L- no W/R/R- good air movement Abdominal:     Comments: Soft, NT, ND, (+)BS    Musculoskeletal:     Cervical back: Normal range of motion and neck supple.     Comments: LUE- 3/5 in biceps/triceps, 2/5, in WE, grip and finger abd RUE- 5/5 in samel muscles LLE- 1/5 in HF, KE, DF and PF RLE- 5-/5 in same muscles  Skin:    General: Skin is warm and dry.  Neurological:     Comments: Patient is alert and tearful.  Makes eye contact with examiner.  Oriented to person place and time.  Follows commands.  Decreased sensation to LT on L side- not on R side MAS  of 3-4 LUE in elbow/shoulder and MAS of 3 in L wrist and MAS of 2 in fingers- can extend fingers  Psychiatric:        Mood and Affect: Mood normal.        Behavior: Behavior normal.     Results for orders placed or performed during the hospital encounter of 12/27/20 (from the past 24 hour(s))  Resp Panel by RT-PCR (Flu A&B, Covid) Nasopharyngeal Swab     Status: None   Collection Time: 12/27/20  5:49 PM   Specimen: Nasopharyngeal Swab; Nasopharyngeal(NP) swabs in vial transport medium  Result Value Ref Range   SARS Coronavirus 2 by RT PCR NEGATIVE NEGATIVE   Influenza A by PCR NEGATIVE NEGATIVE   Influenza B by PCR NEGATIVE NEGATIVE  Ethanol     Status: None   Collection Time: 12/27/20  5:49 PM  Result Value Ref Range   Alcohol, Ethyl (B) <10 <10 mg/dL  Protime-INR     Status: None   Collection Time: 12/27/20  5:49 PM  Result Value Ref Range   Prothrombin Time 13.6 11.4 - 15.2 seconds   INR 1.0 0.8 - 1.2  APTT     Status: None   Collection Time: 12/27/20  5:49 PM  Result Value Ref Range   aPTT 33 24 - 36 seconds  CBC     Status: Abnormal   Collection Time: 12/27/20  5:49 PM  Result Value Ref Range   WBC 6.9 4.0 - 10.5 K/uL   RBC 5.39 (H) 3.87 - 5.11 MIL/uL   Hemoglobin 12.5 12.0 - 15.0 g/dL   HCT 39.8 36.0 - 46.0 %   MCV 73.8 (L) 80.0 - 100.0 fL   MCH 23.2 (L) 26.0 - 34.0 pg   MCHC 31.4 30.0 - 36.0 g/dL   RDW 16.2 (H) 11.5 - 15.5 %   Platelets 264 150 - 400 K/uL   nRBC 0.0 0.0 - 0.2 %  Differential     Status: None   Collection Time: 12/27/20  5:49 PM  Result Value Ref Range   Neutrophils Relative % 47 %   Neutro Abs 3.2 1.7 - 7.7 K/uL   Lymphocytes Relative 40 %   Lymphs Abs 2.8 0.7 - 4.0 K/uL   Monocytes Relative 10 %   Monocytes Absolute 0.7 0.1 - 1.0 K/uL   Eosinophils Relative 2 %   Eosinophils Absolute 0.2 0.0 - 0.5 K/uL   Basophils Relative 1 %   Basophils Absolute 0.1 0.0 - 0.1 K/uL   Immature Granulocytes 0 %   Abs  Immature Granulocytes 0.02 0.00 - 0.07  K/uL  Comprehensive metabolic panel     Status: Abnormal   Collection Time: 12/27/20  5:49 PM  Result Value Ref Range   Sodium 138 135 - 145 mmol/L   Potassium 3.5 3.5 - 5.1 mmol/L   Chloride 104 98 - 111 mmol/L   CO2 25 22 - 32 mmol/L   Glucose, Bld 143 (H) 70 - 99 mg/dL   BUN 11 6 - 20 mg/dL   Creatinine, Ser 3.88 (H) 0.44 - 1.00 mg/dL   Calcium 9.4 8.9 - 26.6 mg/dL   Total Protein 8.1 6.5 - 8.1 g/dL   Albumin 3.5 3.5 - 5.0 g/dL   AST 11 (L) 15 - 41 U/L   ALT 8 0 - 44 U/L   Alkaline Phosphatase 66 38 - 126 U/L   Total Bilirubin 0.5 0.3 - 1.2 mg/dL   GFR, Estimated >66 >48 mL/min   Anion gap 9 5 - 15  I-Stat beta hCG blood, ED     Status: None   Collection Time: 12/27/20  5:53 PM  Result Value Ref Range   I-stat hCG, quantitative <5.0 <5 mIU/mL   Comment 3          I-stat chem 8, ED     Status: Abnormal   Collection Time: 12/27/20  5:55 PM  Result Value Ref Range   Sodium 140 135 - 145 mmol/L   Potassium 3.5 3.5 - 5.1 mmol/L   Chloride 103 98 - 111 mmol/L   BUN 12 6 - 20 mg/dL   Creatinine, Ser 6.16 0.44 - 1.00 mg/dL   Glucose, Bld 122 (H) 70 - 99 mg/dL   Calcium, Ion 4.00 1.80 - 1.40 mmol/L   TCO2 28 22 - 32 mmol/L   Hemoglobin 13.9 12.0 - 15.0 g/dL   HCT 97.0 44.9 - 25.2 %  Urine rapid drug screen (hosp performed)     Status: Abnormal   Collection Time: 12/27/20  7:30 PM  Result Value Ref Range   Opiates NONE DETECTED NONE DETECTED   Cocaine NONE DETECTED NONE DETECTED   Benzodiazepines NONE DETECTED NONE DETECTED   Amphetamines NONE DETECTED NONE DETECTED   Tetrahydrocannabinol POSITIVE (A) NONE DETECTED   Barbiturates NONE DETECTED NONE DETECTED  Urinalysis, Routine w reflex microscopic Urine, Clean Catch     Status: Abnormal   Collection Time: 12/27/20  7:30 PM  Result Value Ref Range   Color, Urine STRAW (A) YELLOW   APPearance CLEAR CLEAR   Specific Gravity, Urine 1.023 1.005 - 1.030   pH 8.0 5.0 - 8.0   Glucose, UA 50 (A) NEGATIVE mg/dL   Hgb urine  dipstick NEGATIVE NEGATIVE   Bilirubin Urine NEGATIVE NEGATIVE   Ketones, ur NEGATIVE NEGATIVE mg/dL   Protein, ur 30 (A) NEGATIVE mg/dL   Nitrite NEGATIVE NEGATIVE   Leukocytes,Ua NEGATIVE NEGATIVE   RBC / HPF 0-5 0 - 5 RBC/hpf   WBC, UA 0-5 0 - 5 WBC/hpf   Bacteria, UA NONE SEEN NONE SEEN   Squamous Epithelial / LPF 0-5 0 - 5   Mucus PRESENT   CBG monitoring, ED     Status: None   Collection Time: 12/27/20  9:40 PM  Result Value Ref Range   Glucose-Capillary 88 70 - 99 mg/dL  CBG monitoring, ED     Status: Abnormal   Collection Time: 12/27/20 11:52 PM  Result Value Ref Range   Glucose-Capillary 112 (H) 70 - 99 mg/dL  CBG monitoring, ED  Status: Abnormal   Collection Time: 12/28/20  3:46 AM  Result Value Ref Range   Glucose-Capillary 121 (H) 70 - 99 mg/dL   Comment 1 Notify RN   HIV Antibody (routine testing w rflx)     Status: None   Collection Time: 12/28/20  3:49 AM  Result Value Ref Range   HIV Screen 4th Generation wRfx Non Reactive Non Reactive  Hemoglobin A1c     Status: Abnormal   Collection Time: 12/28/20  3:49 AM  Result Value Ref Range   Hgb A1c MFr Bld 6.6 (H) 4.8 - 5.6 %   Mean Plasma Glucose 142.72 mg/dL  Lipid panel     Status: Abnormal   Collection Time: 12/28/20  3:49 AM  Result Value Ref Range   Cholesterol 209 (H) 0 - 200 mg/dL   Triglycerides 63 <150 mg/dL   HDL 60 >40 mg/dL   Total CHOL/HDL Ratio 3.5 RATIO   VLDL 13 0 - 40 mg/dL   LDL Cholesterol 136 (H) 0 - 99 mg/dL  Glucose, capillary     Status: None   Collection Time: 12/28/20 11:01 AM  Result Value Ref Range   Glucose-Capillary 98 70 - 99 mg/dL   MR BRAIN WO CONTRAST  Result Date: 12/27/2020 CLINICAL DATA:  Acute stroke presentation with slurred speech. EXAM: MRI HEAD WITHOUT CONTRAST TECHNIQUE: Multiplanar, multiecho pulse sequences of the brain and surrounding structures were obtained without intravenous contrast. COMPARISON:  CT studies earlier same day.  MRI 12/06/2019. FINDINGS:  Brain: Diffusion imaging shows a 4-5 mm acute infarction within the left upper centrum semiovale region. No other acute infarction. Chronic small-vessel ischemic changes affect pons. Old small vessel cerebellar infarctions. Old small vessel infarctions of the thalami and basal ganglia. Hemosiderin deposition in the right basal ganglia and radiating white matter tracts. Chronic small-vessel ischemic changes of the hemispheric white matter. No large vessel territory infarction. No mass lesion, acute hemorrhage, hydrocephalus or extra-axial collection. Vascular: Major vessels at the base of the brain show flow. Skull and upper cervical spine: Negative Sinuses/Orbits: Clear/normal Other: None IMPRESSION: 4-5 mm acute infarction of the left hemispheric white matter in the upper parietal centrum semiovale region. No large or cortical infarction. Extensive chronic ischemic changes elsewhere throughout the brain as outlined above. Electronically Signed   By: Nelson Chimes M.D.   On: 12/27/2020 20:50   DG Chest Port 1 View  Result Date: 12/27/2020 CLINICAL DATA:  Altered mental status, indistinct focal opacity on neck CT angiogram EXAM: PORTABLE CHEST 1 VIEW COMPARISON:  10/27/2020 chest radiograph. FINDINGS: Stable cardiomediastinal silhouette with mild cardiomegaly. No pneumothorax. No pleural effusion. Indistinct faint opacity in the apical right lung. Otherwise clear lungs with no overt pulmonary edema. IMPRESSION: Stable mild cardiomegaly without overt pulmonary edema. Indistinct faint opacity in the apical right lung, correlating to the site of mixed density opacity on the neck CT angiogram from earlier today. Short-term follow-up dedicated chest CT recommended when clinically feasible. Electronically Signed   By: Ilona Sorrel M.D.   On: 12/27/2020 19:11   ECHOCARDIOGRAM COMPLETE  Result Date: 12/28/2020    ECHOCARDIOGRAM REPORT   Patient Name:   Diamond Rice Date of Exam: 12/28/2020 Medical Rec #:  [id number]          Height:       61.0 in Accession #:    [id number]        Weight:       169.8 lb Date of Birth:  [birth year]  BSA:          1.762 m Patient Age:    30 years          BP:           199/124 mmHg Patient Gender: F                 HR:           73 bpm. Exam Location:  Inpatient Procedure: 2D Echo Indications:    stroke  History:        Patient has prior history of Echocardiogram examinations, most                 recent 12/07/2019. Risk Factors:Hypertension, Dyslipidemia and                 Diabetes.  Sonographer:    Johny Chess Referring Phys: [place]  1. Left ventricular ejection fraction, by estimation, is 60 to 65%. The left ventricle has normal function. The left ventricle has no regional wall motion abnormalities. There is moderate left ventricular hypertrophy. Left ventricular diastolic parameters are consistent with Grade I diastolic dysfunction (impaired relaxation).  2. Right ventricular systolic function is normal. The right ventricular size is normal.  3. Left atrial size was mildly dilated.  4. The mitral valve is normal in structure. No evidence of mitral valve regurgitation. No evidence of mitral stenosis.  5. Left coronary cusp calcification. The aortic valve is tricuspid. There is mild calcification of the aortic valve. There is mild thickening of the aortic valve. Aortic valve regurgitation is moderate to severe. No aortic stenosis is present.  6. The inferior vena cava is normal in size with greater than 50% respiratory variability, suggesting right atrial pressure of 3 mmHg. Conclusion(s)/Recommendation(s): No intracardiac source of embolism detected on this transthoracic study. A transesophageal echocardiogram is recommended to exclude cardiac source of embolism if clinically indicated. FINDINGS  Left Ventricle: Left ventricular ejection fraction, by estimation, is 60 to 65%. The left ventricle has normal function. The left ventricle has no regional wall  motion abnormalities. The left ventricular internal cavity size was normal in size. There is  moderate left ventricular hypertrophy. Left ventricular diastolic parameters are consistent with Grade I diastolic dysfunction (impaired relaxation). Right Ventricle: The right ventricular size is normal. No increase in right ventricular wall thickness. Right ventricular systolic function is normal. Left Atrium: Left atrial size was mildly dilated. Right Atrium: Right atrial size was normal in size. Pericardium: There is no evidence of pericardial effusion. Mitral Valve: The mitral valve is normal in structure. No evidence of mitral valve regurgitation. No evidence of mitral valve stenosis. Tricuspid Valve: The tricuspid valve is normal in structure. Tricuspid valve regurgitation is not demonstrated. No evidence of tricuspid stenosis. Aortic Valve: Left coronary cusp calcification. The aortic valve is tricuspid. There is mild calcification of the aortic valve. There is mild thickening of the aortic valve. Aortic valve regurgitation is moderate to severe. Aortic regurgitation PHT measures 489 msec. No aortic stenosis is present. Pulmonic Valve: The pulmonic valve was normal in structure. Pulmonic valve regurgitation is not visualized. No evidence of pulmonic stenosis. Aorta: The aortic root is normal in size and structure. Venous: The inferior vena cava is normal in size with greater than 50% respiratory variability, suggesting right atrial pressure of 3 mmHg. IAS/Shunts: No atrial level shunt detected by color flow Doppler.  LEFT VENTRICLE PLAX 2D LVIDd:         3.90 cm  LVIDs:         3.00 cm LV PW:         1.60 cm LV IVS:        1.30 cm LVOT diam:     2.00 cm LV SV:         75 LV SV Index:   43 LVOT Area:     3.14 cm  LV Volumes (MOD) LV vol d, MOD A2C: 81.6 ml LV vol s, MOD A2C: 34.5 ml LV SV MOD A2C:     47.1 ml RIGHT VENTRICLE             IVC RV S prime:     14.90 cm/s  IVC diam: 1.30 cm TAPSE (M-mode): 2.3 cm LEFT  ATRIUM             Index       RIGHT ATRIUM           Index LA diam:        3.00 cm 1.70 cm/m  RA Area:     14.10 cm LA Vol (A2C):   61.3 ml 34.80 ml/m RA Volume:   30.60 ml  17.37 ml/m LA Vol (A4C):   56.4 ml 32.02 ml/m LA Biplane Vol: 59.9 ml 34.00 ml/m  AORTIC VALVE LVOT Vmax:   124.00 cm/s LVOT Vmean:  77.600 cm/s LVOT VTI:    0.239 m AI PHT:      489 msec  AORTA Ao Root diam: 2.60 cm Ao Asc diam:  3.40 cm MV E velocity: 66.70 cm/s MV A velocity: 132.00 cm/s  SHUNTS MV E/A ratio:  0.51         Systemic VTI:  0.24 m                             Systemic Diam: 2.00 cm Candee Furbish MD Electronically signed by Candee Furbish MD Signature Date/Time: 12/28/2020/11:31:32 AM    Final    CT HEAD CODE STROKE WO CONTRAST  Result Date: 12/27/2020 CLINICAL DATA:  Code stroke. Acute presentation with slurred speech. EXAM: CT HEAD WITHOUT CONTRAST TECHNIQUE: Contiguous axial images were obtained from the base of the skull through the vertex without intravenous contrast. COMPARISON:  MRI 12/06/2019.  CT 12/06/2019 FINDINGS: Brain: No abnormality seen affecting the brainstem or cerebellum. 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 Chronic small-vessel ischemic changes are present throughout the hemispheric white matter. This appears progressive since last year. No definitely acute stroke. One could not rule out the possibility of a recent small vessel infarction. No sign of large vessel or cortical infarction Vascular: There is atherosclerotic calcification of the major vessels at the base of the brain. Skull: Negative Sinuses/Orbits: Clear/normal Other: None ASPECTS ([place] Stroke Program Early CT Score) - Ganglionic level infarction (caudate, lentiform nuclei, internal capsule, insula, M1-M3 cortex): 7 - Supraganglionic infarction (M4-M6 cortex): 3 Total score (0-10 with 10 being normal): 10 IMPRESSION: 1. No sign of acute large vessel infarction. Chronic small-vessel ischemic changes throughout the  brain, somewhat progressive since April of 2021. Cannot rule out the possibility of a more recent small vessel infarction. 2. ASPECTS is 10 3. These results were communicated to Dr. Cheral Marker at 6:04 pmon 5/2/2022by text page via the [facility] messaging system. Electronically Signed   By: Nelson Chimes M.D.   On: 12/27/2020 18:05   CT ANGIO HEAD CODE STROKE  Result Date: 12/27/2020 CLINICAL DATA:  Acute stroke presentation with  slurred speech. EXAM: CT ANGIOGRAPHY HEAD AND NECK TECHNIQUE: Multidetector CT imaging of the head and neck was performed using the standard protocol during bolus administration of intravenous contrast. Multiplanar CT image reconstructions and MIPs were obtained to evaluate the vascular anatomy. Carotid stenosis measurements (when applicable) are obtained utilizing NASCET criteria, using the distal internal carotid diameter as the denominator. CONTRAST:  50mL OMNIPAQUE IOHEXOL 350 MG/ML SOLN COMPARISON:  Head CT same day FINDINGS: CTA NECK FINDINGS Aortic arch: Aortic atherosclerosis. Branching pattern is normal without origin stenosis. Right carotid system: Common carotid artery widely patent to the bifurcation. Soft and calcified plaque at the carotid bifurcation and ICA bulb. No stenosis beyond the diameter of the more distal cervical ICA. Left carotid system: Common carotid artery widely patent to the bifurcation. Mild soft plaque at the carotid bifurcation and ICA bulb but no stenosis beyond the diameter of the more distal cervical ICA. Vertebral arteries: Both vertebral artery origins are widely patent. Both vertebral arteries appear normal through the cervical region to the foramen magnum. Skeleton: Ordinary cervical spondylosis. Other neck: No mass or lymphadenopathy. Dilated fluid and air-filled esophagus. Upper chest: Abnormal density in the lateral aspect of the right upper lobe with solid and sub solid components measuring approximately 2.8 cm. This has a worrisome appearance and  could possibly be neoplastic or infectious. Review of the MIP images confirms the above findings CTA HEAD FINDINGS Anterior circulation: Both internal carotid arteries are patent through the skull base and siphon regions. Siphon atherosclerotic calcification but without stenosis greater than 30%. The anterior and middle cerebral vessels are patent. No large or medium vessel occlusion is seen. Distal vessel narrowing and atherosclerotic irregularity. Posterior circulation: Both vertebral arteries are patent to the basilar. The basilar is a small vessel. Both posterior cerebral arteries receive most of there supply from the anterior circulation. Distal vessel narrowing and irregularity within the PCA branches. Venous sinuses: Patent and normal. Anatomic variants: None significant. Review of the MIP images confirms the above findings IMPRESSION: No large or medium vessel occlusion. Atherosclerotic plaque at both carotid bifurcations but without stenosis. Intracranial distal branch vessel irregularity. Dilated fluid and air-filled esophagus. 2.8 cm solid and sub solid lesion in the lateral aspect of the right upper lobe. This could be pneumonia or, more concerning, a neoplastic lesion. Complete chest CT suggested when able. This may require further workup. Electronically Signed   By: Nelson Chimes M.D.   On: 12/27/2020 18:27   CT ANGIO NECK CODE STROKE  Result Date: 12/27/2020 CLINICAL DATA:  Acute stroke presentation with slurred speech. EXAM: CT ANGIOGRAPHY HEAD AND NECK TECHNIQUE: Multidetector CT imaging of the head and neck was performed using the standard protocol during bolus administration of intravenous contrast. Multiplanar CT image reconstructions and MIPs were obtained to evaluate the vascular anatomy. Carotid stenosis measurements (when applicable) are obtained utilizing NASCET criteria, using the distal internal carotid diameter as the denominator. CONTRAST:  58mL OMNIPAQUE IOHEXOL 350 MG/ML SOLN  COMPARISON:  Head CT same day FINDINGS: CTA NECK FINDINGS Aortic arch: Aortic atherosclerosis. Branching pattern is normal without origin stenosis. Right carotid system: Common carotid artery widely patent to the bifurcation. Soft and calcified plaque at the carotid bifurcation and ICA bulb. No stenosis beyond the diameter of the more distal cervical ICA. Left carotid system: Common carotid artery widely patent to the bifurcation. Mild soft plaque at the carotid bifurcation and ICA bulb but no stenosis beyond the diameter of the more distal cervical ICA. Vertebral arteries: Both vertebral artery  origins are widely patent. Both vertebral arteries appear normal through the cervical region to the foramen magnum. Skeleton: Ordinary cervical spondylosis. Other neck: No mass or lymphadenopathy. Dilated fluid and air-filled esophagus. Upper chest: Abnormal density in the lateral aspect of the right upper lobe with solid and sub solid components measuring approximately 2.8 cm. This has a worrisome appearance and could possibly be neoplastic or infectious. Review of the MIP images confirms the above findings CTA HEAD FINDINGS Anterior circulation: Both internal carotid arteries are patent through the skull base and siphon regions. Siphon atherosclerotic calcification but without stenosis greater than 30%. The anterior and middle cerebral vessels are patent. No large or medium vessel occlusion is seen. Distal vessel narrowing and atherosclerotic irregularity. Posterior circulation: Both vertebral arteries are patent to the basilar. The basilar is a small vessel. Both posterior cerebral arteries receive most of there supply from the anterior circulation. Distal vessel narrowing and irregularity within the PCA branches. Venous sinuses: Patent and normal. Anatomic variants: None significant. Review of the MIP images confirms the above findings IMPRESSION: No large or medium vessel occlusion. Atherosclerotic plaque at both  carotid bifurcations but without stenosis. Intracranial distal branch vessel irregularity. Dilated fluid and air-filled esophagus. 2.8 cm solid and sub solid lesion in the lateral aspect of the right upper lobe. This could be pneumonia or, more concerning, a neoplastic lesion. Complete chest CT suggested when able. This may require further workup. Electronically Signed   By: Nelson Chimes M.D.   On: 12/27/2020 18:27     Assessment/Plan: Diagnosis: B/L weakness in setting of L hemi due to previous strokes.  1. Does the need for close, 24 hr/day medical supervision in concert with the patient's rehab needs make it unreasonable for this patient to be served in a less intensive setting? Potentially 2. Co-Morbidities requiring supervision/potential complications: spasticity, L hemiplegia with R hemiparesis; HTN, DM, HLD 3. Due to bladder management, bowel management, safety, skin/wound care, disease management, medication administration, pain management and patient education, does the patient require 24 hr/day rehab nursing? Potentially 4. Does the patient require coordinated care of a physician, rehab nurse, therapy disciplines of PT and OT to address physical and functional deficits in the context of the above medical diagnosis(es)? Potentially Addressing deficits in the following areas: balance, endurance, locomotion, strength, transferring, bathing, dressing, feeding, grooming and toileting 5. Can the patient actively participate in an intensive therapy program of at least 3 hrs of therapy per day at least 5 days per week? Yes 6. The potential for patient to make measurable gains while on inpatient rehab is fair 7. Anticipated functional outcomes upon discharge from inpatient rehab are supervision and min assist  with PT, supervision and min assist with OT, supervision and min assist with SLP. 8. Estimated rehab length of stay to reach the above functional goals is: 2-3 weeks 9. Anticipated discharge  destination: dispo is pending 10. Overall Rehab/Functional Prognosis: good and fair  RECOMMENDATIONS: This patient's condition is appropriate for continued rehabilitative care in the following setting: CIR and SNF Patient has agreed to participate in recommended program. Potentially Note that insurance prior authorization may be required for reimbursement for recommended care.  Comment:  1. Strongly suggest that pt be referred to our clinic for Dr Letta Pate or Dr Ranell Patrick do Botox of LUE- think could really help her in long run with spasticity. Clinic number is [phone].  2. Pt admitted she was homeless- which makes dispo issues difficult, however she, of course needs additional rehab- - I  honestly think she's a good candidate if admissions coordinators are able to find her a disposition possibility, if not, need to go to SNF for recovery.  Lavon Paganini Angiulli, PA-C 12/28/2020

## 2020-12-28 NOTE — Progress Notes (Signed)
Rehab Admissions Coordinator Note:  Patient was screened by Cleatrice Burke for appropriateness for an Inpatient Acute Rehab Consult per therapy recs.  At this time, we are recommending Inpatient Rehab consult. I will place order per protocol.  Cleatrice Burke RN MSN 12/28/2020, 1:17 PM  I can be reached at [phone].

## 2020-12-28 NOTE — Evaluation (Signed)
Clinical/Bedside Swallow Evaluation Patient Details  Name: Diamond Rice MRN: [id number] Date of Birth: 1970-11-14  Today's Date: 12/28/2020 Time: SLP Start Time (ACUTE ONLY): 3664 SLP Stop Time (ACUTE ONLY): 1310 SLP Time Calculation (min) (ACUTE ONLY): 11 min  Past Medical History:  Past Medical History:  Diagnosis Date  . Anxiety   . Chronic lower back pain 04/17/2012   "just got over some; catched when I walked"  . COPD (chronic obstructive pulmonary disease) ([place])   . Critical lower limb ischemia ([place]) 05/27/2018  . Depression   . Headache(784.0) 04/17/2012   "~ qod; lately waking up in am w/one"  . High cholesterol   . Hypertension   . Migraines 04/17/2012  . Obesity   . Sleep apnea    CPAP  . Stroke ([place])   . Type II diabetes mellitus ([place]) 08/28/2002   Past Surgical History:  Past Surgical History:  Procedure Laterality Date  . ABDOMINAL AORTOGRAM W/LOWER EXTREMITY Left 05/27/2018   Procedure: ABDOMINAL AORTOGRAM W/LOWER EXTREMITY Runoff and Possible Intervention;  Surgeon: Waynetta Sandy, MD;  Location: [place] [facility];  Service: Cardiovascular;  Laterality: Left;  . APPLICATION OF WOUND VAC Left 06/06/2018   Procedure: APPLICATION OF WOUND VAC;  Surgeon: Waynetta Sandy, MD;  Location: [place];  Service: Vascular;  Laterality: Left;  . BIOPSY  04/04/2018   Procedure: BIOPSY;  Surgeon: [name], MD;  [facility] ENDOSCOPY;  Service: Endoscopy;;  . BIOPSY  10/27/2020   Procedure: BIOPSY;  Surgeon: Arta Silence, MD;  Location: [facility] ENDOSCOPY;  Service: Endoscopy;;  . CARDIAC CATHETERIZATION  ~ 2011  . COLONOSCOPY N/A 04/04/2018   Procedure: COLONOSCOPY;  Surgeon: [name], MD;  [facility] ENDOSCOPY;  Service: Endoscopy;  Laterality: N/A;  . COLONOSCOPY WITH PROPOFOL N/A 10/27/2020   Procedure: COLONOSCOPY WITH PROPOFOL;  Surgeon: Arta Silence, MD;  Location: [facility] ENDOSCOPY;  Service: Endoscopy;  Laterality: N/A;  . ESOPHAGOGASTRODUODENOSCOPY  (EGD) WITH PROPOFOL N/A 10/27/2020   Procedure: ESOPHAGOGASTRODUODENOSCOPY (EGD) WITH PROPOFOL  WITH POSSIBLE DIL;  Surgeon: Arta Silence, MD;  Location: [facility] ENDOSCOPY;  Service: Endoscopy;  Laterality: N/A;  . LOWER EXTREMITY ANGIOGRAM Left 05/27/2018  . PERIPHERAL VASCULAR INTERVENTION Left 05/27/2018   Procedure: PERIPHERAL VASCULAR INTERVENTION;  Surgeon: Waynetta Sandy, MD;  Location: [place] [facility];  Service: Cardiovascular;  Laterality: Left;  . POLYPECTOMY  04/04/2018   Procedure: POLYPECTOMY;  Surgeon: [name], MD;  Location: [facility] ENDOSCOPY;  Service: Endoscopy;;  . POLYPECTOMY  10/27/2020   Procedure: POLYPECTOMY;  Surgeon: Arta Silence, MD;  Location: [facility] ENDOSCOPY;  Service: Endoscopy;;  . TRANSMETATARSAL AMPUTATION Left 05/28/2018   Procedure: AMPUTATION TOES THREE, FOUR AND FIVE on left foot;  Surgeon: Waynetta Sandy, MD;  Location: [place];  Service: Vascular;  Laterality: Left;  . WOUND DEBRIDEMENT Left 06/06/2018   Procedure: DEBRIDEMENT WOUND LEFT FOOT;  Surgeon: Waynetta Sandy, MD;  Location: [facility] OR;  Service: Vascular;  Laterality: Left;   HPI:  Pt is a 50 yo female with a h/o 4 prior strokes presents wtih an acute worsening of BLE pain and dysarthria. MRI showed an acute infarction of the L hemispheric white matter in the upper parietal centrum semiovale region. PMH also includes: DM2, HTN, HLD, smoking, prior cocaine abuse, PE, medication nonadhering. Most recent MBS 12/08/19 revealed aspiration of thin liquids (PAS 7, 8) with recs for Dys 2/Nectar. She reports advancing to regular solids/thin liquids at SNF after FEES. Pt also had dysarthria at that time impacting word  to short phrase level.   Assessment / Plan / Recommendation Clinical Impression  Pt has signs of dysphagia with both liquids and purees, including multiple, audible swallows per bolus and frequent eructation. She describes having to regurgitate food at baseline 2-3 times a week  - question an esophageal component. She also has coughing with thin liquids that she reports as happening only if she "drinks too fast," but today it is occurring even with small, single sips. Recommend proceeding with MBS to better evaluate oropharyngeal swallowing. SLP Visit Diagnosis: Dysphagia, unspecified (R13.10)    Aspiration Risk  Moderate aspiration risk    Diet Recommendation NPO   Medication Administration: Via alternative means    Other  Recommendations Oral Care Recommendations: Oral care QID   Follow up Recommendations  (tba)      Frequency and Duration            Prognosis Prognosis for Safe Diet Advancement: Good      Swallow Study   General HPI: Pt is a 50 yo female with a h/o 4 prior strokes presents wtih an acute worsening of BLE pain and dysarthria. MRI showed an acute infarction of the L hemispheric white matter in the upper parietal centrum semiovale region. PMH also includes: DM2, HTN, HLD, smoking, prior cocaine abuse, PE, medication nonadhering. Most recent MBS 12/08/19 revealed aspiration of thin liquids (PAS 7, 8) with recs for Dys 2/Nectar. She reports advancing to regular solids/thin liquids at SNF after FEES. Pt also had dysarthria at that time impacting word to short phrase level. Type of Study: Bedside Swallow Evaluation Previous Swallow Assessment: see HPI Diet Prior to this Study: NPO Temperature Spikes Noted: No Respiratory Status: Room air History of Recent Intubation: No Behavior/Cognition: Alert;Cooperative;Pleasant mood Oral Cavity Assessment: Within Functional Limits Oral Care Completed by SLP: No Oral Cavity - Dentition: Adequate natural dentition Vision: Functional for self-feeding Self-Feeding Abilities: Able to feed self;Needs set up Patient Positioning: Upright in bed Baseline Vocal Quality: Normal Volitional Cough: Weak (but baseline per pt since previous stroke) Volitional Swallow: Able to elicit    Oral/Motor/Sensory Function  Overall Oral Motor/Sensory Function: Mild impairment Facial ROM: Reduced left;Suspected CN VII (facial) dysfunction;Other (Comment) (baseline per pt) Facial Symmetry: Abnormal symmetry left;Suspected CN VII (facial) dysfunction (baseline per pt) Facial Strength: Reduced left;Suspected CN VII (facial) dysfunction (baseline per pt) Lingual ROM: Within Functional Limits Lingual Symmetry: Within Functional Limits Lingual Strength: Within Functional Limits Velum:  (difficult to visualize) Mandible: Within Functional Limits   Ice Chips Ice chips: Not tested   Thin Liquid Thin Liquid: Impaired Presentation: Cup;Self Fed;Straw Pharyngeal  Phase Impairments: Multiple swallows;Throat Clearing - Immediate    Nectar Thick Nectar Thick Liquid: Not tested   Honey Thick Honey Thick Liquid: Not tested   Puree Puree: Impaired Presentation: Self Fed;Spoon Pharyngeal Phase Impairments: Multiple swallows;Wet Vocal Quality   Solid     Solid: Not tested      Osie Bond., M.A. [place] Pager [phone] Office [phone]  12/28/2020,1:37 PM

## 2020-12-28 NOTE — ED Notes (Signed)
Tried calling report to floor

## 2020-12-28 NOTE — Progress Notes (Signed)
SLP Cancellation Note  Patient Details Name: Diamond Rice MRN: [id number] DOB: 1971-05-03   Cancelled treatment:       Reason Eval/Treat Not Completed: Patient at procedure or test/unavailable (ECHO). Will f/u as able.   Osie Bond., M.A. [place] [facility] Pager [phone] Office [phone]  12/28/2020, 9:48 AM

## 2020-12-29 ENCOUNTER — Inpatient Hospital Stay (HOSPITAL_COMMUNITY): Payer: Medicare Other

## 2020-12-29 DIAGNOSIS — R1310 Dysphagia, unspecified: Secondary | ICD-10-CM | POA: Diagnosis not present

## 2020-12-29 LAB — CBC
HCT: 39 % (ref 36.0–46.0)
Hemoglobin: 12.3 g/dL (ref 12.0–15.0)
MCH: 23 pg — ABNORMAL LOW (ref 26.0–34.0)
MCHC: 31.5 g/dL (ref 30.0–36.0)
MCV: 73 fL — ABNORMAL LOW (ref 80.0–100.0)
Platelets: 257 10*3/uL (ref 150–400)
RBC: 5.34 MIL/uL — ABNORMAL HIGH (ref 3.87–5.11)
RDW: 16 % — ABNORMAL HIGH (ref 11.5–15.5)
WBC: 6.5 10*3/uL (ref 4.0–10.5)
nRBC: 0 % (ref 0.0–0.2)

## 2020-12-29 LAB — GLUCOSE, CAPILLARY
Glucose-Capillary: 145 mg/dL — ABNORMAL HIGH (ref 70–99)
Glucose-Capillary: 105 mg/dL — ABNORMAL HIGH (ref 70–99)
Glucose-Capillary: 186 mg/dL — ABNORMAL HIGH (ref 70–99)
Glucose-Capillary: 157 mg/dL — ABNORMAL HIGH (ref 70–99)

## 2020-12-29 LAB — BASIC METABOLIC PANEL
Anion gap: 6 (ref 5–15)
BUN: 11 mg/dL (ref 6–20)
CO2: 28 mmol/L (ref 22–32)
Calcium: 9.2 mg/dL (ref 8.9–10.3)
Chloride: 104 mmol/L (ref 98–111)
Creatinine, Ser: 1.02 mg/dL — ABNORMAL HIGH (ref 0.44–1.00)
GFR, Estimated: >60 mL/min (ref >60)
Glucose, Bld: 126 mg/dL — ABNORMAL HIGH (ref 70–99)
Potassium: 3.2 mmol/L — ABNORMAL LOW (ref 3.5–5.1)
Sodium: 138 mmol/L (ref 135–145)

## 2020-12-29 MED ORDER — POTASSIUM CHLORIDE CRYS ER 20 MEQ PO TBCR
40.0000 meq | EXTENDED_RELEASE_TABLET | Freq: Once | ORAL | Status: AC
  Administered 2020-12-29: 40 meq via ORAL
  Filled 2020-12-29: qty 2

## 2020-12-29 MED ORDER — INSULIN ASPART 100 UNIT/ML IJ SOLN
0.0000 [IU] | Freq: Three times a day (TID) | INTRAMUSCULAR | Status: DC
  Administered 2020-12-29: 2 [IU] via SUBCUTANEOUS
  Administered 2020-12-30: 1 [IU] via SUBCUTANEOUS
  Administered 2020-12-30 – 2020-12-31 (×3): 2 [IU] via SUBCUTANEOUS
  Administered 2021-01-01: 1 [IU] via SUBCUTANEOUS
  Administered 2021-01-01: 2 [IU] via SUBCUTANEOUS
  Administered 2021-01-01: 1 [IU] via SUBCUTANEOUS
  Administered 2021-01-02: 2 [IU] via SUBCUTANEOUS
  Administered 2021-01-02 – 2021-01-03 (×2): 1 [IU] via SUBCUTANEOUS
  Administered 2021-01-03: 2 [IU] via SUBCUTANEOUS

## 2020-12-29 MED ORDER — AMOXICILLIN-POT CLAVULANATE 875-125 MG PO TABS
1.0000 | ORAL_TABLET | Freq: Two times a day (BID) | ORAL | Status: DC
  Administered 2020-12-29 – 2021-01-03 (×11): 1 via ORAL
  Filled 2020-12-29 (×11): qty 1

## 2020-12-29 MED ORDER — METOPROLOL SUCCINATE ER 50 MG PO TB24
50.0000 mg | ORAL_TABLET | Freq: Every morning | ORAL | Status: DC
  Administered 2020-12-29 – 2021-01-03 (×6): 50 mg via ORAL
  Filled 2020-12-29 (×5): qty 1

## 2020-12-29 MED ORDER — AMLODIPINE BESYLATE 10 MG PO TABS
10.0000 mg | ORAL_TABLET | Freq: Every day | ORAL | Status: DC
  Administered 2020-12-29 – 2021-01-03 (×6): 10 mg via ORAL
  Filled 2020-12-29 (×6): qty 1

## 2020-12-29 MED ORDER — IOHEXOL 300 MG/ML  SOLN
75.0000 mL | Freq: Once | INTRAMUSCULAR | Status: AC | PRN
  Administered 2020-12-29: 75 mL via INTRAVENOUS

## 2020-12-29 NOTE — Progress Notes (Signed)
Name: Diamond Rice DOB: Sep 03, 1970  Please be advised that the above-named patient will require a short-term nursing home stay -- anticipated 30 days or less for rehabilitation and strengthening. The plan is for return home.

## 2020-12-29 NOTE — H&P (Incomplete)
Physical Medicine and Rehabilitation Admission H&P    Chief Complaint  Patient presents with  . 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  Per chart review independent with ADLs uses a quad cane prior to admission.  Presented 12/27/2020 with acute onset of dysarthria with gait disturbance.  Blood pressure 226/138.  CT/MRI showed a 4-5 mm acute infarction of the left hemisphere white matter in the upper parietal centrum semiovale region.  No large or cortical infarct.  CT angiogram of head and neck no signs of acute large vessel infarction.  Echocardiogram with ejection fraction of 60 to 65% no wall motion abnormalities grade 1 diastolic dysfunction.  Admission chemistries unremarkable except creatinine 1.04 glucose 143 urine drug screen positive marijuana.  Currently maintained on aspirin for CVA prophylaxis.  Lovenox for DVT prophylaxis.  Tolerating a regular consistency diet.  Therapy evaluations completed due to patient's gait disturbance decreased functional mobility was admitted for a comprehensive rehab program.  Review of Systems  Constitutional: Negative for chills and fever.  HENT: Negative for hearing loss.   Eyes: Negative for blurred vision and double vision.  Respiratory: Positive for shortness of breath.   Cardiovascular: Positive for leg swelling. Negative for chest pain.  Gastrointestinal: Positive for constipation. Negative for heartburn, nausea and vomiting.  Genitourinary: Negative for dysuria, flank pain and hematuria.  Musculoskeletal: Positive for back pain, joint pain and myalgias.  Skin: Negative for rash.   Neurological: Positive for speech change, weakness and headaches.  Psychiatric/Behavioral: Positive for depression. The patient has insomnia.        Anxiety  All other systems reviewed and are negative.  Past Medical History:  Diagnosis Date  . Anxiety   . Chronic lower back pain 04/17/2012   "just got over some; catched when I walked"  . COPD (chronic obstructive pulmonary disease) (HCC)   . Critical lower limb ischemia (HCC) 05/27/2018  . Depression   . Headache(784.0) 04/17/2012   "~ qod; lately waking up in am w/one"  . High cholesterol   . Hypertension   . Migraines 04/17/2012  . Obesity   . Sleep apnea    CPAP  . Stroke (HCC)   . Type II diabetes mellitus (HCC) 08/28/2002   Past Surgical History:  Procedure Laterality Date  . ABDOMINAL AORTOGRAM W/LOWER EXTREMITY Left 05/27/2018   Procedure: ABDOMINAL AORTOGRAM W/LOWER EXTREMITY Runoff and Possible Intervention;  Surgeon: Maeola Harman, MD;  Location: [facility] INVASIVE CV LAB;  Service: Cardiovascular;  Laterality: Left;  . APPLICATION OF WOUND VAC Left 06/06/2018   Procedure: APPLICATION OF WOUND VAC;  Surgeon: Maeola Harman, MD;  Location: [facility] OR;  Service: Vascular;  Laterality: Left;  . BIOPSY  04/04/2018   Procedure: BIOPSY;  Surgeon: Lynann Bologna, MD;  Location: [facility] ENDOSCOPY;  Service: Endoscopy;;  . BIOPSY  10/27/2020   Procedure: BIOPSY;  Surgeon: Willis Modena, MD;  Location: [facility] ENDOSCOPY;  Service: Endoscopy;;  . CARDIAC CATHETERIZATION  ~ 2011  . COLONOSCOPY N/A 04/04/2018   Procedure: COLONOSCOPY;  Surgeon: Lynann Bologna, MD;  Location: [facility] ENDOSCOPY;  Service: Endoscopy;  Laterality: N/A;  . COLONOSCOPY WITH PROPOFOL N/A 10/27/2020   Procedure: COLONOSCOPY WITH PROPOFOL;  Surgeon: Willis Modena, MD;  Location: [facility] ENDOSCOPY;  Service: Endoscopy;  Laterality: N/A;  . ESOPHAGOGASTRODUODENOSCOPY (EGD) WITH PROPOFOL N/A 10/27/2020   Procedure: ESOPHAGOGASTRODUODENOSCOPY (EGD) WITH PROPOFOL  WITH POSSIBLE  DIL;  Surgeon: Arta Silence, MD;  Location: [facility] ENDOSCOPY;  Service: Endoscopy;  Laterality: N/A;  . LOWER EXTREMITY ANGIOGRAM Left 05/27/2018  . PERIPHERAL VASCULAR INTERVENTION Left 05/27/2018   Procedure: PERIPHERAL VASCULAR INTERVENTION;  Surgeon: Waynetta Sandy, MD;  Location: [facility] CV LAB;  Service: Cardiovascular;  Laterality: Left;  . POLYPECTOMY  04/04/2018   Procedure: POLYPECTOMY;  Surgeon: [name], MD;  Location: [facility] ENDOSCOPY;  Service: Endoscopy;;  . POLYPECTOMY  10/27/2020   Procedure: POLYPECTOMY;  Surgeon: Arta Silence, MD;  Location: [facility] ENDOSCOPY;  Service: Endoscopy;;  . TRANSMETATARSAL AMPUTATION Left 05/28/2018   Procedure: AMPUTATION TOES THREE, FOUR AND FIVE on left foot;  Surgeon: Waynetta Sandy, MD;  Location: [place];  Service: Vascular;  Laterality: Left;  . WOUND DEBRIDEMENT Left 06/06/2018   Procedure: DEBRIDEMENT WOUND LEFT FOOT;  Surgeon: Waynetta Sandy, MD;  Location: [facility] OR;  Service: Vascular;  Laterality: Left;   Family History  Problem Relation Age of Onset  . Stroke Brother 60  . Stroke Brother 47  . Heart attack Mother 48  . Stroke Father 44   Social History:  reports that she has quit smoking. Her smoking use included cigarettes. She has a 7.00 pack-year smoking history. She has never used smokeless tobacco. She reports current drug use. Drugs: Marijuana and "Crack" cocaine. She reports that she does not drink alcohol. Allergies:  Allergies  Allergen Reactions  . Morphine And Related Hives  . Metformin Diarrhea and Other (See Comments)    "Allergic," per [facility]   Medications Prior to Admission  Medication Sig Dispense Refill  . amLODipine (NORVASC) 10 MG tablet Take 1 tablet (10 mg total) by mouth daily. 30 tablet 0  . ARIPiprazole (ABILIFY) 5 MG tablet Take 5 mg by mouth daily.    . cloNIDine (CATAPRES) 0.3 MG tablet Take 1 tablet (0.3 mg total) by mouth 2 (two) times daily. 60 tablet 0  . hydrALAZINE  (APRESOLINE) 100 MG tablet Take 1 tablet (100 mg total) by mouth 3 (three) times daily. 90 tablet 0  . insulin detemir (LEVEMIR) 100 UNIT/ML FlexPen Inject 15 Units into the skin 2 (two) times daily. 15 mL 0  . lisinopril (ZESTRIL) 40 MG tablet Take 1 tablet (40 mg total) by mouth daily. 30 tablet 0  . metoprolol succinate (TOPROL-XL) 50 MG 24 hr tablet Take 50 mg by mouth every morning.    . mirtazapine (REMERON) 15 MG tablet Take 1 tablet (15 mg total) by mouth at bedtime. For Depression 30 tablet 0  . ACCU-CHEK GUIDE test strip 2 (two) times daily. as directed    . Accu-Chek Softclix Lancets lancets 2 (two) times daily.    Marland Kitchen aspirin EC 81 MG EC tablet Take 1 tablet (81 mg total) by mouth daily. (Patient not taking: No sig reported)    . atorvastatin (LIPITOR) 40 MG tablet Take 1 tablet (40 mg total) by mouth daily at 6 PM. (Patient not taking: No sig reported)    . Blood Glucose Monitoring Suppl (ACCU-CHEK GUIDE) w/Device KIT USE AS DIRECTED TO CHECK BLOOD SUGAR      Drug Regimen Review Drug regimen was reviewed and remains appropriate with no significant issues identified.  Home: Home Living Family/patient expects to be discharged to:: Other (Comment) Additional Comments: Pt reports her lease ended in April and she  stayed at a friends house in May.   Functional History: Prior Function Level of Independence: Needs assistance Gait / Transfers Assistance Needed: Has been using quad cane to ambulate, but reports it has gotten more difficult in the last week. ADL's / Homemaking Assistance Needed: Reports she is independent with ADLs but takes increased time  Functional Status:  Mobility: Bed Mobility Overal bed mobility: Needs Assistance Bed Mobility: Supine to Sit,Sit to Supine Supine to sit: Min assist Sit to supine: Min assist General bed mobility comments: Min A For steadying assist at trunk to come to sitting. Min A for LE assist for return to supine. Transfers Overall transfer  level: Needs assistance Equipment used: 2 person hand held assist Transfers: Sit to/from Stand Sit to Stand: Min assist,+2 physical assistance General transfer comment: Min A +2 for lift assist and to help with standing fully upright. Increased time required. Ambulation/Gait Ambulation/Gait assistance: Mod assist,+2 physical assistance Assistive device: 2 person hand held assist General Gait Details: Took side steps at EOB. Pt with difficulty weightshifting onto RLE secondary to increased weakness. REquired manual assist to weightshift and assist to move LLE. Mod A +2 for steadying.    ADL: ADL Overall ADL's : Needs assistance/impaired Eating/Feeding: NPO Grooming: Set up,Supervision/safety,Sitting Upper Body Bathing: Minimal assistance,Sitting Lower Body Bathing: Maximal assistance,Sit to/from stand,Moderate assistance Lower Body Bathing Details (indicate cue type and reason): Max A for standing balance Upper Body Dressing : Minimal assistance,Sitting Upper Body Dressing Details (indicate cue type and reason): Min A to don gown Lower Body Dressing: Minimal assistance,Maximal assistance,Sit to/from stand Lower Body Dressing Details (indicate cue type and reason): Min A for positioning BELs for donning socks. Max A for standing balance Toilet Transfer: Maximal assistance,+2 for physical assistance,+2 for safety/equipment (side steps towards [facility]) Functional mobility during ADLs: Maximal assistance,+2 for physical assistance (side steps) General ADL Comments: Pt presenting with residual L sided weakness and now has new right sided weakness.  Cognition: Cognition Overall Cognitive Status: No family/caregiver present to determine baseline cognitive functioning Arousal/Alertness: Awake/alert Orientation Level: Oriented X4 Attention: Sustained Sustained Attention: Appears intact Memory: Impaired Memory Impairment: Decreased short term memory Decreased Short Term Memory: Verbal  basic Awareness: Impaired Awareness Impairment: Emergent impairment Problem Solving: Appears intact Safety/Judgment: Appears intact Cognition Arousal/Alertness: Awake/alert Behavior During Therapy: WFL for tasks assessed/performed Overall Cognitive Status: No family/caregiver present to determine baseline cognitive functioning General Comments: Following cues and presenting with high motivation to participate. benefits from increased time for speech and processing  Physical Exam: Blood pressure (!) 217/106, pulse 75, temperature 98 F (36.7 C), temperature source Oral, resp. rate 18, height $RemoveBe'5\' 1"'aPygtaknP$  (1.549 m), weight 77 kg, last menstrual period 04/26/2018, SpO2 99 %. Physical Exam Neurological:     Comments: Patient is alert.  She is a bit anxious and tearful.  Makes eye contact with examiner.  Oriented x3 and follows commands.     Results for orders placed or performed during the hospital encounter of 12/27/20 (from the past 48 hour(s))  Resp Panel by RT-PCR (Flu A&B, Covid) Nasopharyngeal Swab     Status: None   Collection Time: 12/27/20  5:49 PM   Specimen: Nasopharyngeal Swab; Nasopharyngeal(NP) swabs in vial transport medium  Result Value Ref Range   SARS Coronavirus 2 by RT PCR NEGATIVE NEGATIVE    Comment: (NOTE) SARS-CoV-2 target nucleic acids are NOT DETECTED.  The SARS-CoV-2 RNA is generally detectable in upper respiratory specimens during the acute phase of infection. The lowest concentration of SARS-CoV-2 viral copies  this assay can detect is 138 copies/mL. A negative result does not preclude SARS-Cov-2 infection and should not be used as the sole basis for treatment or other patient management decisions. A negative result may occur with  improper specimen collection/handling, submission of specimen other than nasopharyngeal swab, presence of viral mutation(s) within the areas targeted by this assay, and inadequate number of viral copies(<138 copies/mL). A negative  result must be combined with clinical observations, patient history, and epidemiological information. The expected result is Negative.  Fact Sheet for Patients:  EntrepreneurPulse.com.au  Fact Sheet for Healthcare Providers:  IncredibleEmployment.be  This test is no t yet approved or cleared by the [place] FDA and  has been authorized for detection and/or diagnosis of SARS-CoV-2 by FDA under an Emergency Use Authorization (EUA). This EUA will remain  in effect (meaning this test can be used) for the duration of the COVID-19 declaration under Section 564(b)(1) of the Act, 21 U.S.C.section 360bbb-3(b)(1), unless the authorization is terminated  or revoked sooner.       Influenza A by PCR NEGATIVE NEGATIVE   Influenza B by PCR NEGATIVE NEGATIVE    Comment: (NOTE) The Xpert Xpress SARS-CoV-2/FLU/RSV plus assay is intended as an aid in the diagnosis of influenza from Nasopharyngeal swab specimens and should not be used as a sole basis for treatment. Nasal washings and aspirates are unacceptable for Xpert Xpress SARS-CoV-2/FLU/RSV testing.  Fact Sheet for Patients: EntrepreneurPulse.com.au  Fact Sheet for Healthcare Providers: IncredibleEmployment.be  This test is not yet approved or cleared by the [place] FDA and has been authorized for detection and/or diagnosis of SARS-CoV-2 by FDA under an Emergency Use Authorization (EUA). This EUA will remain in effect (meaning this test can be used) for the duration of the COVID-19 declaration under Section 564(b)(1) of the Act, 21 U.S.C. section 360bbb-3(b)(1), unless the authorization is terminated or revoked.  Performed at [facility], [place] [address], [place], [place] [address]   Ethanol     Status: None   Collection Time: 12/27/20  5:49 PM  Result Value Ref Range   Alcohol, Ethyl (B) <10 <10 mg/dL    Comment: (NOTE) Lowest detectable limit  for serum alcohol is 10 mg/dL.  For medical purposes only. Performed at [facility], [place] [address], [place], [place] [address]   Protime-INR     Status: None   Collection Time: 12/27/20  5:49 PM  Result Value Ref Range   Prothrombin Time 13.6 11.4 - 15.2 seconds   INR 1.0 0.8 - 1.2    Comment: (NOTE) INR goal varies based on device and disease states. Performed at [facility], [place] [address] [place], [place] [address]   APTT     Status: None   Collection Time: 12/27/20  5:49 PM  Result Value Ref Range   aPTT 33 24 - 36 seconds    Comment: Performed at [facility] [address] [place], [place] [address]  CBC     Status: Abnormal   Collection Time: 12/27/20  5:49 PM  Result Value Ref Range   WBC 6.9 4.0 - 10.5 K/uL   RBC 5.39 (H) 3.87 - 5.11 MIL/uL   Hemoglobin 12.5 12.0 - 15.0 g/dL   HCT 39.8 36.0 - 46.0 %   MCV 73.8 (L) 80.0 - 100.0 fL   MCH 23.2 (L) 26.0 - 34.0 pg   MCHC 31.4 30.0 - 36.0 g/dL   RDW 16.2 (H) 11.5 - 15.5 %  Platelets 264 150 - 400 K/uL    Comment: REPEATED TO VERIFY   nRBC 0.0 0.0 - 0.2 %    Comment: Performed at [facility], [place] [address] [place], [place] [address]  Differential     Status: None   Collection Time: 12/27/20  5:49 PM  Result Value Ref Range   Neutrophils Relative % 47 %   Neutro Abs 3.2 1.7 - 7.7 K/uL   Lymphocytes Relative 40 %   Lymphs Abs 2.8 0.7 - 4.0 K/uL   Monocytes Relative 10 %   Monocytes Absolute 0.7 0.1 - 1.0 K/uL   Eosinophils Relative 2 %   Eosinophils Absolute 0.2 0.0 - 0.5 K/uL   Basophils Relative 1 %   Basophils Absolute 0.1 0.0 - 0.1 K/uL   Immature Granulocytes 0 %   Abs Immature Granulocytes 0.02 0.00 - 0.07 K/uL    Comment: Performed at [facility] [address] [place], [place] [address]  Comprehensive metabolic panel     Status: Abnormal   Collection Time: 12/27/20  5:49 PM  Result Value Ref Range   Sodium 138 135 - 145 mmol/L   Potassium 3.5 3.5 - 5.1 mmol/L    Chloride 104 98 - 111 mmol/L   CO2 25 22 - 32 mmol/L   Glucose, Bld 143 (H) 70 - 99 mg/dL    Comment: Glucose reference range applies only to samples taken after fasting for at least 8 hours.   BUN 11 6 - 20 mg/dL   Creatinine, Ser 1.04 (H) 0.44 - 1.00 mg/dL   Calcium 9.4 8.9 - 10.3 mg/dL   Total Protein 8.1 6.5 - 8.1 g/dL   Albumin 3.5 3.5 - 5.0 g/dL   AST 11 (L) 15 - 41 U/L   ALT 8 0 - 44 U/L   Alkaline Phosphatase 66 38 - 126 U/L   Total Bilirubin 0.5 0.3 - 1.2 mg/dL   GFR, Estimated >60 >60 mL/min    Comment: (NOTE) Calculated using the CKD-EPI Creatinine Equation (2021)    Anion gap 9 5 - 15    Comment: Performed at [facility] [address], [place], [place] [address]  I-Stat beta hCG blood, ED     Status: None   Collection Time: 12/27/20  5:53 PM  Result Value Ref Range   I-stat hCG, quantitative <5.0 <5 mIU/mL   Comment 3            Comment:   GEST. AGE      CONC.  (mIU/mL)   <=1 WEEK        5 - 50     2 WEEKS       50 - 500     3 WEEKS       100 - 10,000     4 WEEKS     1,000 - 30,000        FEMALE AND NON-PREGNANT FEMALE:     LESS THAN 5 mIU/mL   I-stat chem 8, ED     Status: Abnormal   Collection Time: 12/27/20  5:55 PM  Result Value Ref Range   Sodium 140 135 - 145 mmol/L   Potassium 3.5 3.5 - 5.1 mmol/L   Chloride 103 98 - 111 mmol/L   BUN 12 6 - 20 mg/dL   Creatinine, Ser 0.90 0.44 - 1.00 mg/dL   Glucose, Bld 143 (H) 70 - 99 mg/dL    Comment: Glucose reference range applies only to samples  taken after fasting for at least 8 hours.   Calcium, Ion 1.16 1.15 - 1.40 mmol/L   TCO2 28 22 - 32 mmol/L   Hemoglobin 13.9 12.0 - 15.0 g/dL   HCT 60.8 72.3 - 75.3 %  Urine rapid drug screen (hosp performed)     Status: Abnormal   Collection Time: 12/27/20  7:30 PM  Result Value Ref Range   Opiates NONE DETECTED NONE DETECTED   Cocaine NONE DETECTED NONE DETECTED   Benzodiazepines NONE DETECTED NONE DETECTED   Amphetamines NONE DETECTED NONE DETECTED    Tetrahydrocannabinol POSITIVE (A) NONE DETECTED   Barbiturates NONE DETECTED NONE DETECTED    Comment: (NOTE) DRUG SCREEN FOR MEDICAL PURPOSES ONLY.  IF CONFIRMATION IS NEEDED FOR ANY PURPOSE, NOTIFY LAB WITHIN 5 DAYS.  LOWEST DETECTABLE LIMITS FOR URINE DRUG SCREEN Drug Class                     Cutoff (ng/mL) Amphetamine and metabolites    1000 Barbiturate and metabolites    200 Benzodiazepine                 200 Tricyclics and metabolites     300 Opiates and metabolites        300 Cocaine and metabolites        300 THC                            50 Performed at [facility], [address] [place], [place] [address]   Urinalysis, Routine w reflex microscopic Urine, Clean Catch     Status: Abnormal   Collection Time: 12/27/20  7:30 PM  Result Value Ref Range   Color, Urine STRAW (A) YELLOW   APPearance CLEAR CLEAR   Specific Gravity, Urine 1.023 1.005 - 1.030   pH 8.0 5.0 - 8.0   Glucose, UA 50 (A) NEGATIVE mg/dL   Hgb urine dipstick NEGATIVE NEGATIVE   Bilirubin Urine NEGATIVE NEGATIVE   Ketones, ur NEGATIVE NEGATIVE mg/dL   Protein, ur 30 (A) NEGATIVE mg/dL   Nitrite NEGATIVE NEGATIVE   Leukocytes,Ua NEGATIVE NEGATIVE   RBC / HPF 0-5 0 - 5 RBC/hpf   WBC, UA 0-5 0 - 5 WBC/hpf   Bacteria, UA NONE SEEN NONE SEEN   Squamous Epithelial / LPF 0-5 0 - 5   Mucus PRESENT     Comment: Performed at [facility], [address] [place], [place] [address]  CBG monitoring, ED     Status: None   Collection Time: 12/27/20  9:40 PM  Result Value Ref Range   Glucose-Capillary 88 70 - 99 mg/dL    Comment: Glucose reference range applies only to samples taken after fasting for at least 8 hours.  CBG monitoring, ED     Status: Abnormal   Collection Time: 12/27/20 11:52 PM  Result Value Ref Range   Glucose-Capillary 112 (H) 70 - 99 mg/dL    Comment: Glucose reference range applies only to samples taken after fasting for at least 8 hours.  CBG monitoring, ED     Status:  Abnormal   Collection Time: 12/28/20  3:46 AM  Result Value Ref Range   Glucose-Capillary 121 (H) 70 - 99 mg/dL    Comment: Glucose reference range applies only to samples taken after fasting for at least 8 hours.   Comment 1 Notify RN   HIV Antibody (routine testing w rflx)  Status: None   Collection Time: 12/28/20  3:49 AM  Result Value Ref Range   HIV Screen 4th Generation wRfx Non Reactive Non Reactive    Comment: Performed at [facility], [address], [place], [place] [address]  Hemoglobin A1c     Status: Abnormal   Collection Time: 12/28/20  3:49 AM  Result Value Ref Range   Hgb A1c MFr Bld 6.6 (H) 4.8 - 5.6 %    Comment: (NOTE) Pre diabetes:          5.7%-6.4%  Diabetes:              >6.4%  Glycemic control for   <7.0% adults with diabetes    Mean Plasma Glucose 142.72 mg/dL    Comment: Performed at [facility] [address] [place], [place] [address]  Lipid panel     Status: Abnormal   Collection Time: 12/28/20  3:49 AM  Result Value Ref Range   Cholesterol 209 (H) 0 - 200 mg/dL   Triglycerides 63 <150 mg/dL   HDL 60 >40 mg/dL   Total CHOL/HDL Ratio 3.5 RATIO   VLDL 13 0 - 40 mg/dL   LDL Cholesterol 136 (H) 0 - 99 mg/dL    Comment:        Total Cholesterol/HDL:CHD Risk Coronary Heart Disease Risk Table                     Men   Women  1/2 Average Risk   3.4   3.3  Average Risk       5.0   4.4  2 X Average Risk   9.6   7.1  3 X Average Risk  23.4   11.0        Use the calculated Patient Ratio above and the CHD Risk Table to determine the patient's CHD Risk.        ATP III CLASSIFICATION (LDL):  <100     mg/dL   Optimal  100-129  mg/dL   Near or Above                    Optimal  130-159  mg/dL   Borderline  160-189  mg/dL   High  >190     mg/dL   Very High Performed at [facility] [address], [place], [place] [address]   Glucose, capillary     Status: None   Collection Time: 12/28/20 11:01 AM  Result Value Ref Range    Glucose-Capillary 98 70 - 99 mg/dL    Comment: Glucose reference range applies only to samples taken after fasting for at least 8 hours.  Glucose, capillary     Status: Abnormal   Collection Time: 12/28/20  4:21 PM  Result Value Ref Range   Glucose-Capillary 144 (H) 70 - 99 mg/dL    Comment: Glucose reference range applies only to samples taken after fasting for at least 8 hours.  Glucose, capillary     Status: Abnormal   Collection Time: 12/28/20  8:20 PM  Result Value Ref Range   Glucose-Capillary 152 (H) 70 - 99 mg/dL    Comment: Glucose reference range applies only to samples taken after fasting for at least 8 hours.  Glucose, capillary     Status: Abnormal   Collection Time: 12/28/20 11:21 PM  Result Value Ref Range   Glucose-Capillary 102 (H) 70 - 99 mg/dL    Comment: Glucose reference range applies  only to samples taken after fasting for at least 8 hours.   Comment 1 Notify RN    Comment 2 Document in Chart   CBC     Status: Abnormal   Collection Time: 12/29/20  2:30 AM  Result Value Ref Range   WBC 6.5 4.0 - 10.5 K/uL   RBC 5.34 (H) 3.87 - 5.11 MIL/uL   Hemoglobin 12.3 12.0 - 15.0 g/dL   HCT 39.0 36.0 - 46.0 %   MCV 73.0 (L) 80.0 - 100.0 fL   MCH 23.0 (L) 26.0 - 34.0 pg   MCHC 31.5 30.0 - 36.0 g/dL   RDW 16.0 (H) 11.5 - 15.5 %   Platelets 257 150 - 400 K/uL    Comment: REPEATED TO VERIFY PLATELET COUNT CONFIRMED BY SMEAR    nRBC 0.0 0.0 - 0.2 %    Comment: Performed at [facility], [place] [address] [place], [place] [address]  Basic metabolic panel     Status: Abnormal   Collection Time: 12/29/20  2:30 AM  Result Value Ref Range   Sodium 138 135 - 145 mmol/L   Potassium 3.2 (L) 3.5 - 5.1 mmol/L   Chloride 104 98 - 111 mmol/L   CO2 28 22 - 32 mmol/L   Glucose, Bld 126 (H) 70 - 99 mg/dL    Comment: Glucose reference range applies only to samples taken after fasting for at least 8 hours.   BUN 11 6 - 20 mg/dL   Creatinine, Ser 1.02 (H) 0.44 - 1.00 mg/dL    Calcium 9.2 8.9 - 10.3 mg/dL   GFR, Estimated >60 >60 mL/min    Comment: (NOTE) Calculated using the CKD-EPI Creatinine Equation (2021)    Anion gap 6 5 - 15    Comment: Performed at [facility] [address] [place], [place] [address]  Glucose, capillary     Status: Abnormal   Collection Time: 12/29/20  3:36 AM  Result Value Ref Range   Glucose-Capillary 145 (H) 70 - 99 mg/dL    Comment: Glucose reference range applies only to samples taken after fasting for at least 8 hours.   Comment 1 Notify RN    Comment 2 Document in Chart   Glucose, capillary     Status: Abnormal   Collection Time: 12/29/20  7:32 AM  Result Value Ref Range   Glucose-Capillary 105 (H) 70 - 99 mg/dL    Comment: Glucose reference range applies only to samples taken after fasting for at least 8 hours.   CT CHEST W CONTRAST  Result Date: 12/29/2020 CLINICAL DATA:  Lung nodule seen on CT angiography of the head and neck. EXAM: CT CHEST WITH CONTRAST TECHNIQUE: Multidetector CT imaging of the chest was performed during intravenous contrast administration. CONTRAST:  33mL OMNIPAQUE IOHEXOL 300 MG/ML  SOLN COMPARISON:  CT a head neck 12/27/2020, chest radiograph 12/27/2020, CT a chest 06/22/2018 FINDINGS: Cardiovascular: Mild cardiomegaly. No pericardial effusion. Scattered coronary artery calcifications. Pulmonary arteries are normal caliber. No large central filling defects are identified on this non tailored examination of the pulmonary arteries. Atherosclerotic plaque within the normal caliber aorta. No acute luminal abnormality of the imaged aorta. No periaortic stranding or hemorrhage. Shared origin of the brachiocephalic and left common carotid arteries. Minimal calcifications in the proximal great vessels. No major venous abnormalities. Suspect laminar flow mixing artifact in the internal jugular veins. No significant venous reflux. Mediastinum/Nodes: Redemonstration of a patulous, distended thoracic esophagus  which contains layering air-fluid levels and  ingested material to the level of the thoracic inlet. No acute tracheal abnormality. Thyroid gland and thoracic inlet is unremarkable. No worrisome mediastinal, hilar or axillary adenopathy. Lungs/Pleura: Corresponding well to the opacity seen on the CT angiography of the head neck are several patchy ground-glass and tree-in-bud opacities present in the right upper lobe in a somewhat branching configuration, favored to reflect some acute infectious or inflammatory process including sequela of aspiration. No other concerning pulmonary nodules or masses. No other consolidative process. No pneumothorax or visible effusion. No convincing CT evidence of edema. Some bandlike opacity in the lung bases, likely atelectasis or scarring. Upper Abdomen: High attenuation enteric contrast media layers dependently within the stomach. No acute abnormalities present in the visualized portions of the upper abdomen. Scattered colonic diverticula without focal inflammation to suggest diverticulitis. Musculoskeletal: Multilevel degenerative changes are present in the imaged portions of the spine. Multilevel flowing anterior osteophytosis, compatible with features of diffuse idiopathic skeletal hyperostosis (DISH). Additional degenerative changes in the bilateral shoulders. Features of right calcific tendinosis/hydroxyapatite deposition. No acute or worrisome osseous abnormalities. Mild dextrocurvature of the spine. No worrisome chest wall mass or lesion. IMPRESSION: There are scattered mixed ground-glass and tree-in-bud nodular opacities predominantly which appear confined to the right upper lobe. Appearance favors an infectious or inflammatory process including possible sequela of aspiration given a chronically patulous and distended thoracic esophagus containing admixed air and ingested material to the level of the thoracic inlet, which is a similar appearance to comparison CT from 2019.  Recommend follow-up noncontrast CT imaging to ensure resolution after therapeutic trial. No other acute intrathoracic process. No other concerning pulmonary nodules or masses. Cardiomegaly.  Coronary artery atherosclerosis. Aortic Atherosclerosis (ICD10-I70.0). Colonic diverticulosis. Multilevel degenerative changes in the spine including features of diffuse idiopathic skeletal hyperostosis. Suspect some hydroxyapatite deposition/calcific tendinosis of the right shoulder. Electronically Signed   By: Lovena Le M.D.   On: 12/29/2020 03:12   MR BRAIN WO CONTRAST  Result Date: 12/27/2020 CLINICAL DATA:  Acute stroke presentation with slurred speech. EXAM: MRI HEAD WITHOUT CONTRAST TECHNIQUE: Multiplanar, multiecho pulse sequences of the brain and surrounding structures were obtained without intravenous contrast. COMPARISON:  CT studies earlier same day.  MRI 12/06/2019. FINDINGS: Brain: Diffusion imaging shows a 4-5 mm acute infarction within the left upper centrum semiovale region. No other acute infarction. Chronic small-vessel ischemic changes affect pons. Old small vessel cerebellar infarctions. Old small vessel infarctions of the thalami and basal ganglia. Hemosiderin deposition in the right basal ganglia and radiating white matter tracts. Chronic small-vessel ischemic changes of the hemispheric white matter. No large vessel territory infarction. No mass lesion, acute hemorrhage, hydrocephalus or extra-axial collection. Vascular: Major vessels at the base of the brain show flow. Skull and upper cervical spine: Negative Sinuses/Orbits: Clear/normal Other: None IMPRESSION: 4-5 mm acute infarction of the left hemispheric white matter in the upper parietal centrum semiovale region. No large or cortical infarction. Extensive chronic ischemic changes elsewhere throughout the brain as outlined above. Electronically Signed   By: Nelson Chimes M.D.   On: 12/27/2020 20:50   DG Chest Port 1 View  Result Date:  12/27/2020 CLINICAL DATA:  Altered mental status, indistinct focal opacity on neck CT angiogram EXAM: PORTABLE CHEST 1 VIEW COMPARISON:  10/27/2020 chest radiograph. FINDINGS: Stable cardiomediastinal silhouette with mild cardiomegaly. No pneumothorax. No pleural effusion. Indistinct faint opacity in the apical right lung. Otherwise clear lungs with no overt pulmonary edema. IMPRESSION: Stable mild cardiomegaly without overt pulmonary edema. Indistinct faint opacity in the  apical right lung, correlating to the site of mixed density opacity on the neck CT angiogram from earlier today. Short-term follow-up dedicated chest CT recommended when clinically feasible. Electronically Signed   By: Ilona Sorrel M.D.   On: 12/27/2020 19:11   DG Swallowing Func-Speech Pathology  Result Date: 12/28/2020 Objective Swallowing Evaluation: Type of Study: MBS-Modified Barium Swallow Study  Patient Details Name: Palmer Shorey MRN: [id number] Date of Birth: 12/04/1970 Today's Date: 12/28/2020 Time: SLP Start Time (ACUTE ONLY): 6213 -SLP Stop Time (ACUTE ONLY): 1400 SLP Time Calculation (min) (ACUTE ONLY): 14 min Past Medical History: Past Medical History: Diagnosis Date . Anxiety  . Chronic lower back pain 04/17/2012  "just got over some; catched when I walked" . COPD (chronic obstructive pulmonary disease) ([place])  . Critical lower limb ischemia ([place]) 05/27/2018 . Depression  . Headache(784.0) 04/17/2012  "~ qod; lately waking up in am w/one" . High cholesterol  . Hypertension  . Migraines 04/17/2012 . Obesity  . Sleep apnea   CPAP . Stroke ([place])  . Type II diabetes mellitus ([place]) 08/28/2002 Past Surgical History: Past Surgical History: Procedure Laterality Date . ABDOMINAL AORTOGRAM W/LOWER EXTREMITY Left 05/27/2018  Procedure: ABDOMINAL AORTOGRAM W/LOWER EXTREMITY Runoff and Possible Intervention;  Surgeon: Waynetta Sandy, MD;  Location: [place] [facility];  Service: Cardiovascular;  Laterality: Left; . APPLICATION OF WOUND VAC  Left 08/65/7846  Procedure: APPLICATION OF WOUND VAC;  Surgeon: Waynetta Sandy, MD;  Location: [place];  Service: Vascular;  Laterality: Left; . BIOPSY  04/04/2018  Procedure: BIOPSY;  Surgeon: [name], MD;  Location: [facility] ENDOSCOPY;  Service: Endoscopy;; . BIOPSY  10/27/2020  Procedure: BIOPSY;  Surgeon: Arta Silence, MD;  Location: [facility] ENDOSCOPY;  Service: Endoscopy;; . CARDIAC CATHETERIZATION  ~ 2011 . COLONOSCOPY N/A 04/04/2018  Procedure: COLONOSCOPY;  Surgeon: [name], MD;  Location: [facility] ENDOSCOPY;  Service: Endoscopy;  Laterality: N/A; . COLONOSCOPY WITH PROPOFOL N/A 10/27/2020  Procedure: COLONOSCOPY WITH PROPOFOL;  Surgeon: Arta Silence, MD;  Location: [facility] ENDOSCOPY;  Service: Endoscopy;  Laterality: N/A; . ESOPHAGOGASTRODUODENOSCOPY (EGD) WITH PROPOFOL N/A 10/27/2020  Procedure: ESOPHAGOGASTRODUODENOSCOPY (EGD) WITH PROPOFOL  WITH POSSIBLE DIL;  Surgeon: Arta Silence, MD;  Location: [facility] ENDOSCOPY;  Service: Endoscopy;  Laterality: N/A; . LOWER EXTREMITY ANGIOGRAM Left 05/27/2018 . PERIPHERAL VASCULAR INTERVENTION Left 05/27/2018  Procedure: PERIPHERAL VASCULAR INTERVENTION;  Surgeon: Waynetta Sandy, MD;  Location: [place] [facility];  Service: Cardiovascular;  Laterality: Left; . POLYPECTOMY  04/04/2018  Procedure: POLYPECTOMY;  Surgeon: [name], MD;  Location: [facility] ENDOSCOPY;  Service: Endoscopy;; . POLYPECTOMY  10/27/2020  Procedure: POLYPECTOMY;  Surgeon: Arta Silence, MD;  Location: [facility] ENDOSCOPY;  Service: Endoscopy;; . TRANSMETATARSAL AMPUTATION Left 05/28/2018  Procedure: AMPUTATION TOES THREE, FOUR AND FIVE on left foot;  Surgeon: Waynetta Sandy, MD;  Location: [place];  Service: Vascular;  Laterality: Left; . WOUND DEBRIDEMENT Left 06/06/2018  Procedure: DEBRIDEMENT WOUND LEFT FOOT;  Surgeon: Waynetta Sandy, MD;  Location: [facility] OR;  Service: Vascular;  Laterality: Left; HPI: Pt is a 50 yo female with a h/o 4 prior strokes presents wtih an acute worsening  of BLE pain and dysarthria. MRI showed an acute infarction of the L hemispheric white matter in the upper parietal centrum semiovale region. PMH also includes: DM2, HTN, HLD, smoking, prior cocaine abuse, PE, medication nonadhering. Most recent MBS 12/08/19 revealed aspiration of thin liquids (PAS 7, 8) with recs for Dys 2/Nectar. She reports advancing to regular solids/thin liquids at SNF after FEES. Pt also  had dysarthria at that time impacting word to short phrase level.  Subjective: eager to eat and drink Assessment / Plan / Recommendation CHL IP CLINICAL IMPRESSIONS 12/28/2020 Clinical Impression Pt presents with a mild oral dysphagia with pharyngeal phase relatively WFL. She has intermittent oral holding with liquids, with mild lingual residue also noted primarily on her first few sips. She also has loss of regular solids to the pyriform sinuses while she is still masticating, but there is no aspiration across all consistencies. Recommend starting a diet consisting of regular solids and thin liquids. Given coughing noted during initial evaluation, will f/u briefly. SLP Visit Diagnosis Dysphagia, oral phase (R13.11) Attention and concentration deficit following -- Frontal lobe and executive function deficit following -- Impact on safety and function Mild aspiration risk   CHL IP TREATMENT RECOMMENDATION 12/28/2020 Treatment Recommendations Therapy as outlined in treatment plan below   Prognosis 12/28/2020 Prognosis for Safe Diet Advancement Good Barriers to Reach Goals -- Barriers/Prognosis Comment -- CHL IP DIET RECOMMENDATION 12/28/2020 SLP Diet Recommendations Regular solids;Thin liquid Liquid Administration via Cup;Straw Medication Administration Whole meds with liquid Compensations Slow rate;Small sips/bites Postural Changes Seated upright at 90 degrees;Remain semi-upright after after feeds/meals (Comment)   CHL IP OTHER RECOMMENDATIONS 12/28/2020 Recommended Consults -- Oral Care Recommendations Oral care BID Other  Recommendations --   CHL IP FOLLOW UP RECOMMENDATIONS 12/28/2020 Follow up Recommendations Inpatient Rehab   CHL IP FREQUENCY AND DURATION 12/28/2020 Speech Therapy Frequency (ACUTE ONLY) min 2x/week Treatment Duration 2 weeks      CHL IP ORAL PHASE 12/28/2020 Oral Phase Impaired Oral - Pudding Teaspoon -- Oral - Pudding Cup -- Oral - Honey Teaspoon -- Oral - Honey Cup -- Oral - Nectar Teaspoon -- Oral - Nectar Cup -- Oral - Nectar Straw -- Oral - Thin Teaspoon -- Oral - Thin Cup Lingual/palatal residue;Holding of bolus Oral - Thin Straw Holding of bolus Oral - Puree WFL Oral - Mech Soft -- Oral - Regular Premature spillage;Impaired mastication Oral - Multi-Consistency -- Oral - Pill WFL Oral Phase - Comment --  CHL IP PHARYNGEAL PHASE 12/28/2020 Pharyngeal Phase WFL Pharyngeal- Pudding Teaspoon -- Pharyngeal -- Pharyngeal- Pudding Cup -- Pharyngeal -- Pharyngeal- Honey Teaspoon -- Pharyngeal -- Pharyngeal- Honey Cup -- Pharyngeal -- Pharyngeal- Nectar Teaspoon -- Pharyngeal -- Pharyngeal- Nectar Cup -- Pharyngeal -- Pharyngeal- Nectar Straw -- Pharyngeal -- Pharyngeal- Thin Teaspoon -- Pharyngeal -- Pharyngeal- Thin Cup -- Pharyngeal -- Pharyngeal- Thin Straw -- Pharyngeal -- Pharyngeal- Puree -- Pharyngeal -- Pharyngeal- Mechanical Soft -- Pharyngeal -- Pharyngeal- Regular -- Pharyngeal -- Pharyngeal- Multi-consistency -- Pharyngeal -- Pharyngeal- Pill -- Pharyngeal -- Pharyngeal Comment --  CHL IP CERVICAL ESOPHAGEAL PHASE 12/28/2020 Cervical Esophageal Phase WFL Pudding Teaspoon -- Pudding Cup -- Honey Teaspoon -- Honey Cup -- Nectar Teaspoon -- Nectar Cup -- Nectar Straw -- Thin Teaspoon -- Thin Cup -- Thin Straw -- Puree -- Mechanical Soft -- Regular -- Multi-consistency -- Pill -- Cervical Esophageal Comment -- Osie Bond., M.A. [facility] Pager [phone] Office [phone] 12/28/2020, 3:02 PM              ECHOCARDIOGRAM COMPLETE  Result Date: 12/28/2020    ECHOCARDIOGRAM REPORT   Patient  Name:   SHAYLYNNE LUNT Date of Exam: 12/28/2020 Medical Rec #:  [id number]         Height:       61.0 in Accession #:    [id number]        Weight:  169.8 lb Date of Birth:  October 17, 1970          BSA:          1.762 m Patient Age:    33 years          BP:           199/124 mmHg Patient Gender: F                 HR:           73 bpm. Exam Location:  Inpatient Procedure: 2D Echo Indications:    stroke  History:        Patient has prior history of Echocardiogram examinations, most                 recent 12/07/2019. Risk Factors:Hypertension, Dyslipidemia and                 Diabetes.  Sonographer:    Johny Chess Referring Phys: [place]  1. Left ventricular ejection fraction, by estimation, is 60 to 65%. The left ventricle has normal function. The left ventricle has no regional wall motion abnormalities. There is moderate left ventricular hypertrophy. Left ventricular diastolic parameters are consistent with Grade I diastolic dysfunction (impaired relaxation).  2. Right ventricular systolic function is normal. The right ventricular size is normal.  3. Left atrial size was mildly dilated.  4. The mitral valve is normal in structure. No evidence of mitral valve regurgitation. No evidence of mitral stenosis.  5. Left coronary cusp calcification. The aortic valve is tricuspid. There is mild calcification of the aortic valve. There is mild thickening of the aortic valve. Aortic valve regurgitation is moderate to severe. No aortic stenosis is present.  6. The inferior vena cava is normal in size with greater than 50% respiratory variability, suggesting right atrial pressure of 3 mmHg. Conclusion(s)/Recommendation(s): No intracardiac source of embolism detected on this transthoracic study. A transesophageal echocardiogram is recommended to exclude cardiac source of embolism if clinically indicated. FINDINGS  Left Ventricle: Left ventricular ejection fraction, by estimation, is 60 to 65%. The left  ventricle has normal function. The left ventricle has no regional wall motion abnormalities. The left ventricular internal cavity size was normal in size. There is  moderate left ventricular hypertrophy. Left ventricular diastolic parameters are consistent with Grade I diastolic dysfunction (impaired relaxation). Right Ventricle: The right ventricular size is normal. No increase in right ventricular wall thickness. Right ventricular systolic function is normal. Left Atrium: Left atrial size was mildly dilated. Right Atrium: Right atrial size was normal in size. Pericardium: There is no evidence of pericardial effusion. Mitral Valve: The mitral valve is normal in structure. No evidence of mitral valve regurgitation. No evidence of mitral valve stenosis. Tricuspid Valve: The tricuspid valve is normal in structure. Tricuspid valve regurgitation is not demonstrated. No evidence of tricuspid stenosis. Aortic Valve: Left coronary cusp calcification. The aortic valve is tricuspid. There is mild calcification of the aortic valve. There is mild thickening of the aortic valve. Aortic valve regurgitation is moderate to severe. Aortic regurgitation PHT measures 489 msec. No aortic stenosis is present. Pulmonic Valve: The pulmonic valve was normal in structure. Pulmonic valve regurgitation is not visualized. No evidence of pulmonic stenosis. Aorta: The aortic root is normal in size and structure. Venous: The inferior vena cava is normal in size with greater than 50% respiratory variability, suggesting right atrial pressure of 3 mmHg. IAS/Shunts: No atrial level shunt detected by color flow Doppler.  LEFT VENTRICLE PLAX 2D LVIDd:         3.90 cm LVIDs:         3.00 cm LV PW:         1.60 cm LV IVS:        1.30 cm LVOT diam:     2.00 cm LV SV:         75 LV SV Index:   43 LVOT Area:     3.14 cm  LV Volumes (MOD) LV vol d, MOD A2C: 81.6 ml LV vol s, MOD A2C: 34.5 ml LV SV MOD A2C:     47.1 ml RIGHT VENTRICLE             IVC RV S  prime:     14.90 cm/s  IVC diam: 1.30 cm TAPSE (M-mode): 2.3 cm LEFT ATRIUM             Index       RIGHT ATRIUM           Index LA diam:        3.00 cm 1.70 cm/m  RA Area:     14.10 cm LA Vol (A2C):   61.3 ml 34.80 ml/m RA Volume:   30.60 ml  17.37 ml/m LA Vol (A4C):   56.4 ml 32.02 ml/m LA Biplane Vol: 59.9 ml 34.00 ml/m  AORTIC VALVE LVOT Vmax:   124.00 cm/s LVOT Vmean:  77.600 cm/s LVOT VTI:    0.239 m AI PHT:      489 msec  AORTA Ao Root diam: 2.60 cm Ao Asc diam:  3.40 cm MV E velocity: 66.70 cm/s MV A velocity: 132.00 cm/s  SHUNTS MV E/A ratio:  0.51         Systemic VTI:  0.24 m                             Systemic Diam: 2.00 cm Candee Furbish MD Electronically signed by Candee Furbish MD Signature Date/Time: 12/28/2020/11:31:32 AM    Final    CT HEAD CODE STROKE WO CONTRAST  Result Date: 12/27/2020 CLINICAL DATA:  Code stroke. Acute presentation with slurred speech. EXAM: CT HEAD WITHOUT CONTRAST TECHNIQUE: Contiguous axial images were obtained from the base of the skull through the vertex without intravenous contrast. COMPARISON:  MRI 12/06/2019.  CT 12/06/2019 FINDINGS: Brain: No abnormality seen affecting the brainstem or cerebellum. 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 Chronic small-vessel ischemic changes are present throughout the hemispheric white matter. This appears progressive since last year. No definitely acute stroke. One could not rule out the possibility of a recent small vessel infarction. No sign of large vessel or cortical infarction Vascular: There is atherosclerotic calcification of the major vessels at the base of the brain. Skull: Negative Sinuses/Orbits: Clear/normal Other: None ASPECTS ([place] Stroke Program Early CT Score) - Ganglionic level infarction (caudate, lentiform nuclei, internal capsule, insula, M1-M3 cortex): 7 - Supraganglionic infarction (M4-M6 cortex): 3 Total score (0-10 with 10 being normal): 10 IMPRESSION: 1. No sign of acute large vessel  infarction. Chronic small-vessel ischemic changes throughout the brain, somewhat progressive since April of 2021. Cannot rule out the possibility of a more recent small vessel infarction. 2. ASPECTS is 10 3. These results were communicated to Dr. Cheral Marker at 6:04 pmon 5/2/2022by text page via the [facility] messaging system. Electronically Signed   By: Nelson Chimes M.D.   On: 12/27/2020 18:05   CT  ANGIO HEAD CODE STROKE  Result Date: 12/27/2020 CLINICAL DATA:  Acute stroke presentation with slurred speech. EXAM: CT ANGIOGRAPHY HEAD AND NECK TECHNIQUE: Multidetector CT imaging of the head and neck was performed using the standard protocol during bolus administration of intravenous contrast. Multiplanar CT image reconstructions and MIPs were obtained to evaluate the vascular anatomy. Carotid stenosis measurements (when applicable) are obtained utilizing NASCET criteria, using the distal internal carotid diameter as the denominator. CONTRAST:  39mL OMNIPAQUE IOHEXOL 350 MG/ML SOLN COMPARISON:  Head CT same day FINDINGS: CTA NECK FINDINGS Aortic arch: Aortic atherosclerosis. Branching pattern is normal without origin stenosis. Right carotid system: Common carotid artery widely patent to the bifurcation. Soft and calcified plaque at the carotid bifurcation and ICA bulb. No stenosis beyond the diameter of the more distal cervical ICA. Left carotid system: Common carotid artery widely patent to the bifurcation. Mild soft plaque at the carotid bifurcation and ICA bulb but no stenosis beyond the diameter of the more distal cervical ICA. Vertebral arteries: Both vertebral artery origins are widely patent. Both vertebral arteries appear normal through the cervical region to the foramen magnum. Skeleton: Ordinary cervical spondylosis. Other neck: No mass or lymphadenopathy. Dilated fluid and air-filled esophagus. Upper chest: Abnormal density in the lateral aspect of the right upper lobe with solid and sub solid components  measuring approximately 2.8 cm. This has a worrisome appearance and could possibly be neoplastic or infectious. Review of the MIP images confirms the above findings CTA HEAD FINDINGS Anterior circulation: Both internal carotid arteries are patent through the skull base and siphon regions. Siphon atherosclerotic calcification but without stenosis greater than 30%. The anterior and middle cerebral vessels are patent. No large or medium vessel occlusion is seen. Distal vessel narrowing and atherosclerotic irregularity. Posterior circulation: Both vertebral arteries are patent to the basilar. The basilar is a small vessel. Both posterior cerebral arteries receive most of there supply from the anterior circulation. Distal vessel narrowing and irregularity within the PCA branches. Venous sinuses: Patent and normal. Anatomic variants: None significant. Review of the MIP images confirms the above findings IMPRESSION: No large or medium vessel occlusion. Atherosclerotic plaque at both carotid bifurcations but without stenosis. Intracranial distal branch vessel irregularity. Dilated fluid and air-filled esophagus. 2.8 cm solid and sub solid lesion in the lateral aspect of the right upper lobe. This could be pneumonia or, more concerning, a neoplastic lesion. Complete chest CT suggested when able. This may require further workup. Electronically Signed   By: Nelson Chimes M.D.   On: 12/27/2020 18:27   CT ANGIO NECK CODE STROKE  Result Date: 12/27/2020 CLINICAL DATA:  Acute stroke presentation with slurred speech. EXAM: CT ANGIOGRAPHY HEAD AND NECK TECHNIQUE: Multidetector CT imaging of the head and neck was performed using the standard protocol during bolus administration of intravenous contrast. Multiplanar CT image reconstructions and MIPs were obtained to evaluate the vascular anatomy. Carotid stenosis measurements (when applicable) are obtained utilizing NASCET criteria, using the distal internal carotid diameter as the  denominator. CONTRAST:  37mL OMNIPAQUE IOHEXOL 350 MG/ML SOLN COMPARISON:  Head CT same day FINDINGS: CTA NECK FINDINGS Aortic arch: Aortic atherosclerosis. Branching pattern is normal without origin stenosis. Right carotid system: Common carotid artery widely patent to the bifurcation. Soft and calcified plaque at the carotid bifurcation and ICA bulb. No stenosis beyond the diameter of the more distal cervical ICA. Left carotid system: Common carotid artery widely patent to the bifurcation. Mild soft plaque at the carotid bifurcation and ICA bulb but no  stenosis beyond the diameter of the more distal cervical ICA. Vertebral arteries: Both vertebral artery origins are widely patent. Both vertebral arteries appear normal through the cervical region to the foramen magnum. Skeleton: Ordinary cervical spondylosis. Other neck: No mass or lymphadenopathy. Dilated fluid and air-filled esophagus. Upper chest: Abnormal density in the lateral aspect of the right upper lobe with solid and sub solid components measuring approximately 2.8 cm. This has a worrisome appearance and could possibly be neoplastic or infectious. Review of the MIP images confirms the above findings CTA HEAD FINDINGS Anterior circulation: Both internal carotid arteries are patent through the skull base and siphon regions. Siphon atherosclerotic calcification but without stenosis greater than 30%. The anterior and middle cerebral vessels are patent. No large or medium vessel occlusion is seen. Distal vessel narrowing and atherosclerotic irregularity. Posterior circulation: Both vertebral arteries are patent to the basilar. The basilar is a small vessel. Both posterior cerebral arteries receive most of there supply from the anterior circulation. Distal vessel narrowing and irregularity within the PCA branches. Venous sinuses: Patent and normal. Anatomic variants: None significant. Review of the MIP images confirms the above findings IMPRESSION: No large or  medium vessel occlusion. Atherosclerotic plaque at both carotid bifurcations but without stenosis. Intracranial distal branch vessel irregularity. Dilated fluid and air-filled esophagus. 2.8 cm solid and sub solid lesion in the lateral aspect of the right upper lobe. This could be pneumonia or, more concerning, a neoplastic lesion. Complete chest CT suggested when able. This may require further workup. Electronically Signed   By: Nelson Chimes M.D.   On: 12/27/2020 18:27       Medical Problem List and Plan: 1.  Gait disturbance with mild dysarthria secondary to left CR infarct likely secondary to small vessel disease  -patient may *** shower  -ELOS/Goals: *** 2.  Antithrombotics: -DVT/anticoagulation: Lovenox  -antiplatelet therapy: Aspirin 81 mg daily.  No DAPT due to history of hemorrhagic infarct. 3. Pain Management: Tylenol as needed 4. Mood: Remeron 15 mg nightly  -antipsychotic agents: Abilify 5 mg daily 5. Neuropsych: This patient is capable of making decisions on her own behalf. 6. Skin/Wound Care: Routine skin checks 7. Fluids/Electrolytes/Nutrition: Routine in and outs with follow-up chemistries 8.  Diabetes mellitus with peripheral neuropathy.  Hemoglobin A1c 6.6.  Presently on SSI.  Patient on Levemir 15 units twice daily prior to admission.  Resume as needed 9.  Permissive hypertension.  Patient on Norvasc 10 mg daily, clonidine 0.3 mg twice daily, hydralazine 100 mg 3 times daily, lisinopril 40 mg daily, Toprol 50 mg daily prior to admission.  Resume as needed 10.  Hyperlipidemia.  Lipitor 11.  Obesity.  BMI 32.07.  Dietary follow-up 12.  COPD with tobacco abuse.  Counseling.  Check oxygen saturations every shift 13.  PVD.  Status post transmetatarsal potation 2019.  Routine skin checks 14.  History of polysubstance abuse.  Urine drug screen positive marijuana.  Provide counseling ***  Cathlyn Parsons, PA-C 12/29/2020

## 2020-12-29 NOTE — NC FL2 (Signed)
[place] LEVEL OF CARE SCREENING TOOL     IDENTIFICATION  [name] Birthdate: 04-27-1971 Sex: female Admission Date (Current Location): 12/27/2020  [facility] and [place] Number:  Herbalist and Address:  [facility], [place] [address], [place], [place] [address]      Provider Number: [id number]  Attending Physician Name and Address:  Modena Jansky, MD  Relative Name and Phone Number:       Current Level of Care: Hospital Recommended Level of Care: [place] Prior Approval Number:    Date Approved/Denied:   PASRR Number: Manual review  Discharge Plan: SNF    Current Diagnoses: Patient Active Problem List   Diagnosis Date Noted  . Acute ischemic stroke ([place]) 12/27/2020  . Abnormal finding on lung imaging 12/27/2020  . Severe episode of recurrent major depressive disorder, with psychotic features ([place])   . Acute CVA (cerebrovascular accident) ([place]) 12/06/2019  . AKI (acute kidney injury) ([place]) 12/06/2019  . LVH (left ventricular hypertrophy) 08/10/2019  . Edema of lower extremity 11/26/2018  . Loss of appetite 11/26/2018  . Urinary incontinence 11/26/2018  . Allergic rhinitis 11/14/2018  . Deep venous thrombosis ([place]) 11/14/2018  . Nicotine dependence 11/14/2018  . Hemorrhagic stroke ([place])   . Diastolic dysfunction   . Type 2 diabetes mellitus ([place])   . Anemia of chronic disease   . Chronic obstructive pulmonary disease ([place])   . ICH (intracerebral hemorrhage) ([place]) 08/30/2018  . Hypertensive heart disease without heart failure 07/04/2018  . Nonrheumatic aortic valve insufficiency 07/04/2018  . Esophageal abnormality   . Class 2 severe obesity due to excess calories with serious comorbidity and body mass index (BMI) of 35.0 to 35.9 in adult [facility])   . Peripheral vascular disease ([place])   . History of osteomyelitis   . History of amputation of lesser toe ([place])   . Pulmonary embolism  ([place]) 06/22/2018  . Microcytic anemia 06/17/2018  . At risk for adverse drug reaction 06/11/2018  . Critical lower limb ischemia ([place]) 05/27/2018  . GI bleeding 04/02/2018  . Acute renal failure (ARF) ([place]) 04/02/2018  . Hypokalemia 04/02/2018  . Polysubstance abuse ([place]) 04/02/2018  . Diabetic foot ulcer ([place]) 04/02/2018  . Tinea pedis 03/07/2018  . Diabetic peripheral neuropathy ([place]) 01/24/2018  . Cannabis use disorder, moderate, dependence ([place]) 05/19/2017  . Cocaine use disorder, moderate, dependence ([place]) 05/19/2017  . CKD (chronic kidney disease) stage 3, GFR 30-59 ml/min (HCC) 02/17/2017  . Noncompliance with medications 02/17/2017  . Suicide attempt ([place]) 11/06/2016  . Mild cognitive impairment 03/21/2016  . Moderate episode of recurrent major depressive disorder ([place]) 03/21/2016  . B12 deficiency 03/09/2016  . Iron deficiency anemia 03/07/2016  . History of CVA with residual deficit 12/30/2015  . Schizoaffective disorder, bipolar type ([name]) 10/07/2015  . OSA (obstructive sleep apnea) 06/09/2015  . Vitamin D deficiency 01/06/2015  . Bilateral knee pain 01/05/2015  . Numbness and tingling in right hand 01/05/2015  . Insomnia 12/07/2014  . Right-sided lacunar infarction ([place]) 09/15/2014  . Left hemiparesis ([place]) 09/15/2014  . Dyspnea   . Back pain 09/11/2013  . Psychoactive substance-induced organic mood disorder ([place]) 05/28/2013  . Cocaine abuse with cocaine-induced mood disorder ([place]) 05/28/2013  . Cannabis dependence with cannabis-induced anxiety disorder ([place]) 05/28/2013  . Morbid obesity with BMI of 45.0-49.9, adult ([place]) 04/25/2012  . Chest pain 04/17/2012  . Hypertension 04/17/2012  . DM (diabetes mellitus) with peripheral vascular complication (HCC)  04/17/2012  . Hyperlipidemia 04/17/2012  . Tobacco use 04/17/2012    Orientation RESPIRATION BLADDER Height & Weight     Self,Time,Situation,Place  Normal Continent Weight: 169 lb 12.1 oz (77 kg) Height:  5\' 1"   (154.9 cm)  BEHAVIORAL SYMPTOMS/MOOD NEUROLOGICAL BOWEL NUTRITION STATUS      Continent Diet (see DC summary)  AMBULATORY STATUS COMMUNICATION OF NEEDS Skin   Extensive Assist Verbally Normal                       Personal Care Assistance Level of Assistance  Bathing,Feeding,Dressing Bathing Assistance: Maximum assistance Feeding assistance: Limited assistance Dressing Assistance: Maximum assistance     Functional Limitations Info  Speech     Speech Info: Impaired (dysarthria)    SPECIAL CARE FACTORS FREQUENCY  PT (By licensed PT),OT (By licensed OT)     PT Frequency: 5x/wk OT Frequency: 5x/wk            Contractures Contractures Info: Not present    Additional Factors Info  Code Status,Allergies,Psychotropic,Insulin Sliding Scale Code Status Info: Full Allergies Info: Morphine And Related, Metformin Psychotropic Info: Abilify 5mg  daily, Remeron 15mg  daily at bed Insulin Sliding Scale Info: see DC summary       Current Medications (12/29/2020):  This is the current hospital active medication list Current Facility-Administered Medications  Medication Dose Route Frequency Provider Last Rate Last Admin  . acetaminophen (TYLENOL) tablet 650 mg  650 mg Oral Q4H PRN Etta Quill, DO   650 mg at 12/28/20 1554   Or  . acetaminophen (TYLENOL) 160 MG/5ML solution 650 mg  650 mg Per Tube Q4H PRN Etta Quill, DO       Or  . acetaminophen (TYLENOL) suppository 650 mg  650 mg Rectal Q4H PRN Etta Quill, DO      . amoxicillin-clavulanate (AUGMENTIN) 875-125 MG per tablet 1 tablet  1 tablet Oral Q12H Hongalgi, Anand D, MD      . ARIPiprazole (ABILIFY) tablet 5 mg  5 mg Oral Daily Jennette Kettle M, DO   5 mg at 12/29/20 1009  . aspirin chewable tablet 81 mg  81 mg Oral Daily Etta Quill, DO   81 mg at 12/29/20 1009  . atorvastatin (LIPITOR) tablet 80 mg  80 mg Oral q1800 Rosalin Hawking, MD   80 mg at 12/28/20 1704  . chlorhexidine (PERIDEX) 0.12 % solution 15  mL  15 mL Mouth Rinse BID Cristal Ford, DO   15 mL at 12/29/20 1009  . enoxaparin (LOVENOX) injection 40 mg  40 mg Subcutaneous Q24H Rosalin Hawking, MD   40 mg at 12/28/20 1728  . insulin aspart (novoLOG) injection 0-9 Units  0-9 Units Subcutaneous TID WC Hongalgi, Anand D, MD      . labetalol (NORMODYNE) injection 5-10 mg  5-10 mg Intravenous Q2H PRN Etta Quill, DO   5 mg at 12/29/20 0746  . MEDLINE mouth rinse  15 mL Mouth Rinse q12n4p Mikhail, [place], DO   15 mL at 12/28/20 1705  . mirtazapine (REMERON) tablet 15 mg  15 mg Oral QHS Jennette Kettle M, DO   15 mg at 12/28/20 2238     Discharge Medications: Please see discharge summary for a list of discharge medications.  Relevant Imaging Results:  Relevant Lab Results:   Additional Information SS#: [id number]  Geralynn Ochs, LCSW

## 2020-12-29 NOTE — Progress Notes (Signed)
Physical Therapy Treatment Patient Details Name: Diamond Rice MRN: [id number] DOB: [birth year] Today's Date: 12/29/2020    History of Present Illness Pt is a 50 y/o female admitted 5/2 secondary to increased difficulty ambulating and difficulty speaking. PMH includes CVA, DM2, HTN, HLD, smoking, prior cocaine abuse, PE (no longer on anticoagulation).    PT Comments    Patient progressing towards physical therapy goals. Patient able to ambulate 4' fwd/4' bwd with HHAx2 and minA+2. Difficulty weight shifting to R to advance L foot. Updated recommendation to SNF as CIR unable to take patient due to home situation.     Follow Up Recommendations  SNF;Supervision/Assistance - 24 hour (CIR denied patient)     Equipment Recommendations  Other (comment) (defer to post acute rehab)    Recommendations for Other Services       Precautions / Restrictions Precautions Precautions: Fall Restrictions Weight Bearing Restrictions: No    Mobility  Bed Mobility Overal bed mobility: Needs Assistance Bed Mobility: Supine to Sit     Supine to sit: Min assist     General bed mobility comments: MinA for trunk elevation with HOB elevated    Transfers Overall transfer level: Needs assistance Equipment used: 2 person hand held assist Transfers: Sit to/from [facility] Sit to Stand: Min assist;+2 physical assistance Stand pivot transfers: Min assist;+2 physical assistance;+2 safety/equipment       General transfer comment: minA+2 for power up to stand and steadying in standing  Ambulation/Gait Ambulation/Gait assistance: Min assist;+2 physical assistance;+2 safety/equipment Gait Distance (Feet): 8 Feet Assistive device: 2 person hand held assist Gait Pattern/deviations: Step-through pattern;Decreased stride length;Decreased stance time - left;Wide base of support;Decreased weight shift to right Gait velocity: decreased   General Gait Details: 4' fwd/4' bwd with HHAx2  and minA+2. Difficulty weightshifting on R LE due to weakness but improved with bwd stepping.   Stairs             Wheelchair Mobility    Modified Rankin (Stroke Patients Only) Modified Rankin (Stroke Patients Only) Pre-Morbid Rankin Score: Slight disability Modified Rankin: Moderately severe disability     Balance Overall balance assessment: Needs assistance Sitting-balance support: No upper extremity supported;Feet supported Sitting balance-Leahy Scale: Fair     Standing balance support: Bilateral upper extremity supported;During functional activity Standing balance-Leahy Scale: Poor Standing balance comment: Reliant on UE and external support                            Cognition Arousal/Alertness: Awake/alert Behavior During Therapy: WFL for tasks assessed/performed Overall Cognitive Status: No family/caregiver present to determine baseline cognitive functioning                                 General Comments: Increased time for processing      Exercises      General Comments        Pertinent Vitals/Pain Pain Assessment: No/denies pain    Home Living                      Prior Function            PT Goals (current goals can now be found in the care plan section) Acute Rehab PT Goals Patient Stated Goal: to regain independence PT Goal Formulation: With patient Time For Goal Achievement: 01/11/21 Potential to Achieve Goals: Good Progress towards PT goals: Progressing  toward goals    Frequency    Min 3X/week      PT Plan Discharge plan needs to be updated;Frequency needs to be updated    Co-evaluation              AM-PAC PT "6 Clicks" Mobility   Outcome Measure  Help needed turning from your back to your side while in a flat bed without using bedrails?: A Little Help needed moving from lying on your back to sitting on the side of a flat bed without using bedrails?: A Little Help needed moving to  and from a bed to a chair (including a wheelchair)?: Total Help needed standing up from a chair using your arms (e.g., wheelchair or bedside chair)?: A Lot Help needed to walk in hospital room?: Total Help needed climbing 3-5 steps with a railing? : Total 6 Click Score: 11    End of Session Equipment Utilized During Treatment: Gait belt Activity Tolerance: Patient tolerated treatment well Patient left: in chair;with call bell/phone within reach;with chair alarm set Nurse Communication: Mobility status PT Visit Diagnosis: Unsteadiness on feet (R26.81);Muscle weakness (generalized) (M62.81);Difficulty in walking, not elsewhere classified (R26.2)     Time: 2353-6144 PT Time Calculation (min) (ACUTE ONLY): 24 min  Charges:  $Gait Training: 23-37 mins                     Rayla Pember A. Gilford Rile PT, DPT Acute Rehabilitation Services Pager [phone] Office [phone]    Linna Hoff 12/29/2020, 1:32 PM

## 2020-12-29 NOTE — Progress Notes (Signed)
  Speech Language Pathology Treatment: Dysphagia  Patient Details Name: Diamond Rice MRN: [id number] DOB: 26-Apr-1971 Today's Date: 12/29/2020 Time: 3220-2542 SLP Time Calculation (min) (ACUTE ONLY): 13 min  Assessment / Plan / Recommendation Clinical Impression   Pt was seen for reassessment due to pt's ongoing reports of symptoms that include eructation, regurgitation, and associated coughing. CT scan has also been completed and is concerning for aspiration, but also shows potential esophageal findings. MBS on previous date showed no aspiration, and pt continues to have no clinical signs of this when consuming regular solids and thin liquids at bedside. After eating and drinking she does have c/o feeling like she needs to belch but that she cannot do so. Discussed overall findings with MD, who has ordered an esophagram to better assess for any potential esophageal motility concerns. Will continue to follow acutely, maintaining current diet for now. Could consider softening foods per pt preference, although for now she prefers to pick softer foods herself as she typically does at home.   HPI HPI: Pt is a 50 yo female with a h/o 4 prior strokes presents wtih an acute worsening of BLE pain and dysarthria. MRI showed an acute infarction of the L hemispheric white matter in the upper parietal centrum semiovale region. PMH also includes: DM2, HTN, HLD, smoking, prior cocaine abuse, PE, medication nonadhering. Most recent MBS 12/08/19 revealed aspiration of thin liquids (PAS 7, 8) with recs for Dys 2/Nectar. She reports advancing to regular solids/thin liquids at SNF after FEES. Pt also had dysarthria at that time impacting word to short phrase level.      SLP Plan  Continue with current plan of care       Recommendations  Diet recommendations: Regular;Thin liquid Liquids provided via: Cup;Straw Medication Administration: Whole meds with liquid Supervision: Patient able to self feed;Intermittent  supervision to cue for compensatory strategies Compensations: Slow rate;Small sips/bites;Follow solids with liquid Postural Changes and/or Swallow Maneuvers: Seated upright 90 degrees;Upright 30-60 min after meal                Oral Care Recommendations: Oral care BID Follow up Recommendations: Skilled Nursing facility SLP Visit Diagnosis: Dysphagia, oral phase (R13.11) Plan: Continue with current plan of care       GO                Osie Bond., M.A. [place] Acute Rehabilitation Services Pager [phone] Office [phone]  12/29/2020, 2:46 PM

## 2020-12-29 NOTE — Progress Notes (Addendum)
PROGRESS NOTE    Diamond Rice  [id number] DOB: 1971-08-20 DOA: 12/27/2020 PCP: [name], MD   Brief Narrative:  HPI on 12/27/2020 by Dr. Jennette Kettle Mitze Deshmukh is a 50 y.o. female with medical history significant of DM2, HTN, HLD, smoking, prior cocaine abuse, PE (no longer on anticoagulation), medication nonadhering.  Pt with h/o 4 prior strokes: 3 ischemic, 1 hemorrhagic.  Pt has chronic L sided deficits from the hemorrhagic stroke.  Most recent stroke was ischemic in April of 2021.  Today pt presents to ED with acute worsening of B leg pain and dysarthria.  LKW was 10am today.  Symptoms are constant, moderate, persistent.  Nothing makes symptoms better or worse.  EMS called, pt noted to be very hypertensive.  No fevers, chills.  Interim history Admitted for acute CVA.  Patient with slurred speech and left-sided weakness.  Completing stroke work-up.  Deemed appropriate for inpatient rehab, insurance authorization pending.  Also noted history of PAD, COPD, CKD stage II.   Assessment & Plan   Acute ischemic stroke -Presented with dysarthria and inability to walk -Patient's had a history of prior strokes -CT head showed no acute large vessel infarction.  Chronic small vessel ischemic changes throughout the brain somewhat progressive since April 2021 -CTA head and neck showed no large or medium vessel occlusion. -MRI showed 4 to 5 mm acute infarction of the left hemispheric white matter in the upper parietal centrum semiovale region -2D echo: LVEF 60-65%. -LDL 136, hemoglobin A1c 6.6 -UDS positive for THC. -Continue statin, aspirin, will order Plavix -PT, OT and ST recommended CIR.  Rehab MD consulted and input appreciated, felt appropriate for inpatient rehab but patient reportedly homeless which makes disposition difficult.  If cannot go to CIR then may need SNF.  Insurance authorization pending.  Also per rehab MD recommendation, needs outpatient  referral to PMR for Botox of left upper extremity to help with spasticity. -Neurology consulted and appreciated. -As per neurology, patient on aspirin 81 mg daily prior to admission, now on aspirin 81 mg daily.  Continue same regimen at discharge.  No DAPT given history of ICH and currently uncontrolled hypertension.  Abnormal finding on lung imaging/suspected chronic aspiration pneumonia -CT head and neck showed 2.8 cm solid and some solid lesion in the lateral aspect of the right upper lobe -CT chest with contrast 5/4: Scattered mixed groundglass and tree-in-bud nodular opacity predominantly which appear confined to right upper lobe.  Appearance favors an infectious or inflammatory process including possible sequelae of aspiration given a chronically patulous and distended thoracic esophagus containing admixed air and ingested material to the level of the thoracic inlet which is a similar appearance compared to CT from 2019.  Recommend follow-up noncontrast CT imaging to ensure resolution after therapeutic trial.  No other acute intrathoracic process or concerning pulmonary nodules or masses. -Patient does report history of what appears to be aspiration/choking episodes when attempting to burp.  We will get speech therapy to evaluate swallow.  Started oral Augmentin x1 week.  Consider repeating CT chest in 4 weeks.  I reviewed the case with PCCM team today who agreed with this management.  Dysphagia Suspected due to stroke. Had EGD 10/27/2020 by [facility] which showed normal esophagus. Speech therapy consulted for swallow evaluation and dietary recommendations  Diabetes mellitus, type II -Currently on regular consistency diet but requested speech therapy to reassess this morning. -Speech therapy evaluated for speech and cognitive issues and recommend CIR.  Requested speech therapy  to assess for dysphagia issues. -hemoglobin A1c 6.6 -Change CBG to before meals and at bedtime and SSI to 3 times daily  with meals.  Reasonable inpatient control.  Levemir currently on hold  Essential hypertension -Allowing for permissive hypertension given acute CVA and gradually normalize in 3 to 5 days -Home medications currently on hold -Markedly hypertensive with BP as high as 221/102 earlier this morning followed by 217/106.  Patient on polypharmacy antihypertensives at home suggestive of difficult to control hypertension including: Amlodipine 10 Mg daily, clonidine 0.3 mg twice daily, hydralazine 100 mg 3 times daily, lisinopril 40 Mg daily, Toprol-XL 50 mg daily.  Will discuss with neurology and consider starting at least some antihypertensives. -As clarified with stroke MD 5/4, permissive hypertension for 24 to 48 hours poststroke followed by gradual normalization of BP in 3 to 5 days.  It has been about 48 hours.  Thereby will resume Toprol-XL and amlodipine at this time and monitor.  Polysubstance abuse -Tox screen was positive for THC.  Cessation counseled.  Hyperlipidemia -Lipid panel shows total cholesterol 209, HDL 60, LDL is 136, triglycerides 63 -Continue statin.  Patient was noncompliant with this.  Compliance counseled.  Hypokalemia: Replace and follow.  DVT Prophylaxis SCDs  Code Status: Full  Family Communication: None at bedside  Disposition Plan:  Status is: Inpatient  Remains inpatient appropriate because:Ongoing diagnostic testing needed not appropriate for outpatient work up and Inpatient level of care appropriate due to severity of illness   Dispo: The patient is from: Home              Anticipated d/c is to: CIR versus SNF              Patient currently is not medically stable to d/c.  Ongoing issues with dysphagia, pending swallow evaluation by speech therapy and elevated blood pressure for which adjusting medications.   Difficult to place patient No  Consultants Neurology  Procedures  None  Antibiotics   Anti-infectives (From admission, onward)   None       Subjective:   Diamond Rice seen and examined today.  Reports history of regurgitation of food and coughing when she has burping, ongoing since prior stroke.  Currently denies dyspnea.  States that the nasal twang is chronic and unchanged from prior stroke.  Objective:   Vitals:   12/29/20 0024 12/29/20 0338 12/29/20 0700 12/29/20 0825  BP: (!) 199/110 (!) 183/107 (!) 221/102 (!) 217/106  Pulse: 70 83 75   Resp:  17 18   Temp:  98.9 F (37.2 C) 98 F (36.7 C)   TempSrc:  Oral Oral   SpO2:  100% 99%   Weight:      Height:        Intake/Output Summary (Last 24 hours) at 12/29/2020 1115 Last data filed at 12/28/2020 1700 Gross per 24 hour  Intake 320 ml  Output 300 ml  Net 20 ml   Filed Weights   12/28/20 0839  Weight: 77 kg    General exam: Young female, moderately built and obese lying comfortably propped up in bed without distress. Respiratory system: Clear to auscultation. Respiratory effort normal. Cardiovascular system: S1 & S2 heard, RRR. No JVD, murmurs, rubs, gallops or clicks. No pedal edema.  Telemetry personally reviewed: Sinus rhythm with first-degree AV block. Gastrointestinal system: Abdomen is nondistended, soft and nontender. No organomegaly or masses felt. Normal bowel sounds heard. Central nervous system: Alert and oriented.  Chronic nasal twang to speech but no dysarthria.  Decreased left nasolabial fold prominence.   Extremities: Right upper extremity grade 4 x 5 power, right lower extremity grade 4 x 5 power, left extremities at least grade 3 x 5 power. Skin: No rashes, lesions or ulcers Psychiatry: Judgement and insight appear normal. Mood & affect appropriate.      Data Reviewed: I have personally reviewed following labs and imaging studies  CBC: Recent Labs  Lab 12/27/20 1749 12/27/20 1755 12/29/20 0230  WBC 6.9  --  6.5  NEUTROABS 3.2  --   --   HGB 12.5 13.9 12.3  HCT 39.8 41.0 39.0  MCV 73.8*  --  73.0*  PLT 264  --  99991111   Basic  Metabolic Panel: Recent Labs  Lab 12/27/20 1749 12/27/20 1755 12/29/20 0230  NA 138 140 138  K 3.5 3.5 3.2*  CL 104 103 104  CO2 25  --  28  GLUCOSE 143* 143* 126*  BUN 11 12 11   CREATININE 1.04* 0.90 1.02*  CALCIUM 9.4  --  9.2   GFR: Estimated Creatinine Clearance: 63.4 mL/min (A) (by C-G formula based on SCr of 1.02 mg/dL (H)). Liver Function Tests: Recent Labs  Lab 12/27/20 1749  AST 11*  ALT 8  ALKPHOS 66  BILITOT 0.5  PROT 8.1  ALBUMIN 3.5   Coagulation Profile: Recent Labs  Lab 12/27/20 1749  INR 1.0   HbA1C: Recent Labs    12/28/20 0349  HGBA1C 6.6*   CBG: Recent Labs  Lab 12/28/20 1621 12/28/20 2020 12/28/20 2321 12/29/20 0336 12/29/20 0732  GLUCAP 144* 152* 102* 145* 105*   Lipid Profile: Recent Labs    12/28/20 0349  CHOL 209*  HDL 60  LDLCALC 136*  TRIG 63  CHOLHDL 3.5   Urine analysis:    Component Value Date/Time   COLORURINE STRAW (A) 12/27/2020 1930   APPEARANCEUR CLEAR 12/27/2020 1930   LABSPEC 1.023 12/27/2020 1930   PHURINE 8.0 12/27/2020 1930   GLUCOSEU 50 (A) 12/27/2020 1930   HGBUR NEGATIVE 12/27/2020 1930   BILIRUBINUR NEGATIVE 12/27/2020 1930   KETONESUR NEGATIVE 12/27/2020 1930   PROTEINUR 30 (A) 12/27/2020 1930   UROBILINOGEN 0.2 08/28/2014 1143   NITRITE NEGATIVE 12/27/2020 1930   LEUKOCYTESUR NEGATIVE 12/27/2020 1930    Recent Results (from the past 240 hour(s))  Resp Panel by RT-PCR (Flu A&B, Covid) Nasopharyngeal Swab     Status: None   Collection Time: 12/27/20  5:49 PM   Specimen: Nasopharyngeal Swab; Nasopharyngeal(NP) swabs in vial transport medium  Result Value Ref Range Status   SARS Coronavirus 2 by RT PCR NEGATIVE NEGATIVE Final    Comment: (NOTE) SARS-CoV-2 target nucleic acids are NOT DETECTED.  The SARS-CoV-2 RNA is generally detectable in upper respiratory specimens during the acute phase of infection. The lowest concentration of SARS-CoV-2 viral copies this assay can detect is 138  copies/mL. A negative result does not preclude SARS-Cov-2 infection and should not be used as the sole basis for treatment or other patient management decisions. A negative result may occur with  improper specimen collection/handling, submission of specimen other than nasopharyngeal swab, presence of viral mutation(s) within the areas targeted by this assay, and inadequate number of viral copies(<138 copies/mL). A negative result must be combined with clinical observations, patient history, and epidemiological information. The expected result is Negative.  Fact Sheet for Patients:  EntrepreneurPulse.com.au  Fact Sheet for Healthcare Providers:  IncredibleEmployment.be  This test is no t yet approved or cleared by the [place] and  has been authorized for detection and/or diagnosis of SARS-CoV-2 by FDA under an Emergency Use Authorization (EUA). This EUA will remain  in effect (meaning this test can be used) for the duration of the COVID-19 declaration under Section 564(b)(1) of the Act, 21 U.S.C.section 360bbb-3(b)(1), unless the authorization is terminated  or revoked sooner.       Influenza A by PCR NEGATIVE NEGATIVE Final   Influenza B by PCR NEGATIVE NEGATIVE Final    Comment: (NOTE) The Xpert Xpress SARS-CoV-2/FLU/RSV plus assay is intended as an aid in the diagnosis of influenza from Nasopharyngeal swab specimens and should not be used as a sole basis for treatment. Nasal washings and aspirates are unacceptable for Xpert Xpress SARS-CoV-2/FLU/RSV testing.  Fact Sheet for Patients: EntrepreneurPulse.com.au  Fact Sheet for Healthcare Providers: IncredibleEmployment.be  This test is not yet approved or cleared by the [place] FDA and has been authorized for detection and/or diagnosis of SARS-CoV-2 by FDA under an Emergency Use Authorization (EUA). This EUA will remain in effect (meaning  this test can be used) for the duration of the COVID-19 declaration under Section 564(b)(1) of the Act, 21 U.S.C. section 360bbb-3(b)(1), unless the authorization is terminated or revoked.  Performed at [facility], [place] [address] [place], [place] [address]       Radiology Studies: CT CHEST W CONTRAST  Result Date: 12/29/2020 CLINICAL DATA:  Lung nodule seen on CT angiography of the head and neck. EXAM: CT CHEST WITH CONTRAST TECHNIQUE: Multidetector CT imaging of the chest was performed during intravenous contrast administration. CONTRAST:  64mL OMNIPAQUE IOHEXOL 300 MG/ML  SOLN COMPARISON:  CT a head neck 12/27/2020, chest radiograph 12/27/2020, CT a chest 06/22/2018 FINDINGS: Cardiovascular: Mild cardiomegaly. No pericardial effusion. Scattered coronary artery calcifications. Pulmonary arteries are normal caliber. No large central filling defects are identified on this non tailored examination of the pulmonary arteries. Atherosclerotic plaque within the normal caliber aorta. No acute luminal abnormality of the imaged aorta. No periaortic stranding or hemorrhage. Shared origin of the brachiocephalic and left common carotid arteries. Minimal calcifications in the proximal great vessels. No major venous abnormalities. Suspect laminar flow mixing artifact in the internal jugular veins. No significant venous reflux. Mediastinum/Nodes: Redemonstration of a patulous, distended thoracic esophagus which contains layering air-fluid levels and ingested material to the level of the thoracic inlet. No acute tracheal abnormality. Thyroid gland and thoracic inlet is unremarkable. No worrisome mediastinal, hilar or axillary adenopathy. Lungs/Pleura: Corresponding well to the opacity seen on the CT angiography of the head neck are several patchy ground-glass and tree-in-bud opacities present in the right upper lobe in a somewhat branching configuration, favored to reflect some acute infectious or inflammatory  process including sequela of aspiration. No other concerning pulmonary nodules or masses. No other consolidative process. No pneumothorax or visible effusion. No convincing CT evidence of edema. Some bandlike opacity in the lung bases, likely atelectasis or scarring. Upper Abdomen: High attenuation enteric contrast media layers dependently within the stomach. No acute abnormalities present in the visualized portions of the upper abdomen. Scattered colonic diverticula without focal inflammation to suggest diverticulitis. Musculoskeletal: Multilevel degenerative changes are present in the imaged portions of the spine. Multilevel flowing anterior osteophytosis, compatible with features of diffuse idiopathic skeletal hyperostosis (DISH). Additional degenerative changes in the bilateral shoulders. Features of right calcific tendinosis/hydroxyapatite deposition. No acute or worrisome osseous abnormalities. Mild dextrocurvature of the spine. No worrisome chest wall mass or lesion. IMPRESSION: There are scattered mixed ground-glass and tree-in-bud nodular opacities predominantly  which appear confined to the right upper lobe. Appearance favors an infectious or inflammatory process including possible sequela of aspiration given a chronically patulous and distended thoracic esophagus containing admixed air and ingested material to the level of the thoracic inlet, which is a similar appearance to comparison CT from 2019. Recommend follow-up noncontrast CT imaging to ensure resolution after therapeutic trial. No other acute intrathoracic process. No other concerning pulmonary nodules or masses. Cardiomegaly.  Coronary artery atherosclerosis. Aortic Atherosclerosis (ICD10-I70.0). Colonic diverticulosis. Multilevel degenerative changes in the spine including features of diffuse idiopathic skeletal hyperostosis. Suspect some hydroxyapatite deposition/calcific tendinosis of the right shoulder. Electronically Signed   By: Lovena Le M.D.   On: 12/29/2020 03:12   MR BRAIN WO CONTRAST  Result Date: 12/27/2020 CLINICAL DATA:  Acute stroke presentation with slurred speech. EXAM: MRI HEAD WITHOUT CONTRAST TECHNIQUE: Multiplanar, multiecho pulse sequences of the brain and surrounding structures were obtained without intravenous contrast. COMPARISON:  CT studies earlier same day.  MRI 12/06/2019. FINDINGS: Brain: Diffusion imaging shows a 4-5 mm acute infarction within the left upper centrum semiovale region. No other acute infarction. Chronic small-vessel ischemic changes affect pons. Old small vessel cerebellar infarctions. Old small vessel infarctions of the thalami and basal ganglia. Hemosiderin deposition in the right basal ganglia and radiating white matter tracts. Chronic small-vessel ischemic changes of the hemispheric white matter. No large vessel territory infarction. No mass lesion, acute hemorrhage, hydrocephalus or extra-axial collection. Vascular: Major vessels at the base of the brain show flow. Skull and upper cervical spine: Negative Sinuses/Orbits: Clear/normal Other: None IMPRESSION: 4-5 mm acute infarction of the left hemispheric white matter in the upper parietal centrum semiovale region. No large or cortical infarction. Extensive chronic ischemic changes elsewhere throughout the brain as outlined above. Electronically Signed   By: Nelson Chimes M.D.   On: 12/27/2020 20:50   DG Chest Port 1 View  Result Date: 12/27/2020 CLINICAL DATA:  Altered mental status, indistinct focal opacity on neck CT angiogram EXAM: PORTABLE CHEST 1 VIEW COMPARISON:  10/27/2020 chest radiograph. FINDINGS: Stable cardiomediastinal silhouette with mild cardiomegaly. No pneumothorax. No pleural effusion. Indistinct faint opacity in the apical right lung. Otherwise clear lungs with no overt pulmonary edema. IMPRESSION: Stable mild cardiomegaly without overt pulmonary edema. Indistinct faint opacity in the apical right lung, correlating to the site  of mixed density opacity on the neck CT angiogram from earlier today. Short-term follow-up dedicated chest CT recommended when clinically feasible. Electronically Signed   By: Ilona Sorrel M.D.   On: 12/27/2020 19:11   DG Swallowing Func-Speech Pathology  Result Date: 12/28/2020 Objective Swallowing Evaluation: Type of Study: MBS-Modified Barium Swallow Study  Patient Details Name: Madalyne Colosimo MRN: [id number] Date of Birth: Jan 07, 1971 Today's Date: 12/28/2020 Time: SLP Start Time (ACUTE ONLY): [id number] -SLP Stop Time (ACUTE ONLY): 1400 SLP Time Calculation (min) (ACUTE ONLY): 14 min Past Medical History: Past Medical History: Diagnosis Date . Anxiety  . Chronic lower back pain 04/17/2012  "just got over some; catched when I walked" . COPD (chronic obstructive pulmonary disease) ([place])  . Critical lower limb ischemia ([place]) 05/27/2018 . Depression  . Headache(784.0) 04/17/2012  "~ qod; lately waking up in am w/one" . High cholesterol  . Hypertension  . Migraines 04/17/2012 . Obesity  . Sleep apnea   CPAP . Stroke ([place])  . Type II diabetes mellitus ([place]) 08/28/2002 Past Surgical History: Past Surgical History: Procedure Laterality Date . ABDOMINAL AORTOGRAM W/LOWER EXTREMITY Left 05/27/2018  Procedure: ABDOMINAL AORTOGRAM W/LOWER EXTREMITY  Runoff and Possible Intervention;  Surgeon: Waynetta Sandy, MD;  Location: [facility];  Service: Cardiovascular;  Laterality: Left; . APPLICATION OF WOUND VAC Left 0000000  Procedure: APPLICATION OF WOUND VAC;  Surgeon: Waynetta Sandy, MD;  Location: [facility];  Service: Vascular;  Laterality: Left; . BIOPSY  04/04/2018  Procedure: BIOPSY;  Surgeon: [name], MD;  Location: [facility] ENDOSCOPY;  Service: Endoscopy;; . BIOPSY  10/27/2020  Procedure: BIOPSY;  Surgeon: Arta Silence, MD;  Location: [facility] ENDOSCOPY;  Service: Endoscopy;; . CARDIAC CATHETERIZATION  ~ 2011 . COLONOSCOPY N/A 04/04/2018  Procedure: COLONOSCOPY;  Surgeon: [name], MD;  Location: [facility]  ENDOSCOPY;  Service: Endoscopy;  Laterality: N/A; . COLONOSCOPY WITH PROPOFOL N/A 10/27/2020  Procedure: COLONOSCOPY WITH PROPOFOL;  Surgeon: Arta Silence, MD;  Location: [facility] ENDOSCOPY;  Service: Endoscopy;  Laterality: N/A; . ESOPHAGOGASTRODUODENOSCOPY (EGD) WITH PROPOFOL N/A 10/27/2020  Procedure: ESOPHAGOGASTRODUODENOSCOPY (EGD) WITH PROPOFOL  WITH POSSIBLE DIL;  Surgeon: Arta Silence, MD;  Location: [facility];  Service: Endoscopy;  Laterality: N/A; . LOWER EXTREMITY ANGIOGRAM Left 05/27/2018 . PERIPHERAL VASCULAR INTERVENTION Left 05/27/2018  Procedure: PERIPHERAL VASCULAR INTERVENTION;  Surgeon: Waynetta Sandy, MD;  Location: [facility];  Service: Cardiovascular;  Laterality: Left; . POLYPECTOMY  04/04/2018  Procedure: POLYPECTOMY;  Surgeon: [name], MD;  Location: [facility] ENDOSCOPY;  Service: Endoscopy;; . POLYPECTOMY  10/27/2020  Procedure: POLYPECTOMY;  Surgeon: Arta Silence, MD;  Location: [facility] ENDOSCOPY;  Service: Endoscopy;; . TRANSMETATARSAL AMPUTATION Left 05/28/2018  Procedure: AMPUTATION TOES THREE, FOUR AND FIVE on left foot;  Surgeon: Waynetta Sandy, MD;  Location: [facility];  Service: Vascular;  Laterality: Left; . WOUND DEBRIDEMENT Left 06/06/2018  Procedure: DEBRIDEMENT WOUND LEFT FOOT;  Surgeon: Waynetta Sandy, MD;  Location: [facility] OR;  Service: Vascular;  Laterality: Left; HPI: Pt is a 50 yo female with a h/o 4 prior strokes presents wtih an acute worsening of BLE pain and dysarthria. MRI showed an acute infarction of the L hemispheric white matter in the upper parietal centrum semiovale region. PMH also includes: DM2, HTN, HLD, smoking, prior cocaine abuse, PE, medication nonadhering. Most recent MBS 12/08/19 revealed aspiration of thin liquids (PAS 7, 8) with recs for Dys 2/Nectar. She reports advancing to regular solids/thin liquids at SNF after FEES. Pt also had dysarthria at that time impacting word to short phrase level.  Subjective: eager to eat and drink  Assessment / Plan / Recommendation CHL IP CLINICAL IMPRESSIONS 12/28/2020 Clinical Impression Pt presents with a mild oral dysphagia with pharyngeal phase relatively WFL. She has intermittent oral holding with liquids, with mild lingual residue also noted primarily on her first few sips. She also has loss of regular solids to the pyriform sinuses while she is still masticating, but there is no aspiration across all consistencies. Recommend starting a diet consisting of regular solids and thin liquids. Given coughing noted during initial evaluation, will f/u briefly. SLP Visit Diagnosis Dysphagia, oral phase (R13.11) Attention and concentration deficit following -- Frontal lobe and executive function deficit following -- Impact on safety and function Mild aspiration risk   CHL IP TREATMENT RECOMMENDATION 12/28/2020 Treatment Recommendations Therapy as outlined in treatment plan below   Prognosis 12/28/2020 Prognosis for Safe Diet Advancement Good Barriers to Reach Goals -- Barriers/Prognosis Comment -- CHL IP DIET RECOMMENDATION 12/28/2020 SLP Diet Recommendations Regular solids;Thin liquid Liquid Administration via Cup;Straw Medication Administration Whole meds with liquid Compensations Slow rate;Small sips/bites Postural Changes Seated upright at 90 degrees;Remain semi-upright after after feeds/meals (Comment)  CHL IP OTHER RECOMMENDATIONS 12/28/2020 Recommended Consults -- Oral Care Recommendations Oral care BID Other Recommendations --   CHL IP FOLLOW UP RECOMMENDATIONS 12/28/2020 Follow up Recommendations Inpatient Rehab   CHL IP FREQUENCY AND DURATION 12/28/2020 Speech Therapy Frequency (ACUTE ONLY) min 2x/week Treatment Duration 2 weeks      CHL IP ORAL PHASE 12/28/2020 Oral Phase Impaired Oral - Pudding Teaspoon -- Oral - Pudding Cup -- Oral - Honey Teaspoon -- Oral - Honey Cup -- Oral - Nectar Teaspoon -- Oral - Nectar Cup -- Oral - Nectar Straw -- Oral - Thin Teaspoon -- Oral - Thin Cup Lingual/palatal residue;Holding of  bolus Oral - Thin Straw Holding of bolus Oral - Puree WFL Oral - Mech Soft -- Oral - Regular Premature spillage;Impaired mastication Oral - Multi-Consistency -- Oral - Pill WFL Oral Phase - Comment --  CHL IP PHARYNGEAL PHASE 12/28/2020 Pharyngeal Phase WFL Pharyngeal- Pudding Teaspoon -- Pharyngeal -- Pharyngeal- Pudding Cup -- Pharyngeal -- Pharyngeal- Honey Teaspoon -- Pharyngeal -- Pharyngeal- Honey Cup -- Pharyngeal -- Pharyngeal- Nectar Teaspoon -- Pharyngeal -- Pharyngeal- Nectar Cup -- Pharyngeal -- Pharyngeal- Nectar Straw -- Pharyngeal -- Pharyngeal- Thin Teaspoon -- Pharyngeal -- Pharyngeal- Thin Cup -- Pharyngeal -- Pharyngeal- Thin Straw -- Pharyngeal -- Pharyngeal- Puree -- Pharyngeal -- Pharyngeal- Mechanical Soft -- Pharyngeal -- Pharyngeal- Regular -- Pharyngeal -- Pharyngeal- Multi-consistency -- Pharyngeal -- Pharyngeal- Pill -- Pharyngeal -- Pharyngeal Comment --  [facility] IP CERVICAL ESOPHAGEAL PHASE 12/28/2020 Cervical Esophageal Phase WFL Pudding Teaspoon -- Pudding Cup -- Honey Teaspoon -- Honey Cup -- Nectar Teaspoon -- Nectar Cup -- Nectar Straw -- Thin Teaspoon -- Thin Cup -- Thin Straw -- Puree -- Mechanical Soft -- Regular -- Multi-consistency -- Pill -- Cervical Esophageal Comment -- Osie Bond., M.A. [facility] Pager [phone] Office [phone] 12/28/2020, 3:02 PM              ECHOCARDIOGRAM COMPLETE  Result Date: 12/28/2020    ECHOCARDIOGRAM REPORT   Patient Name:   AIVAH PUTMAN Date of Exam: 12/28/2020 Medical Rec #:  [id number]         Height:       61.0 in Accession #:    [id number]        Weight:       169.8 lb Date of Birth:  [birth year]          BSA:          1.762 m Patient Age:    32 years          BP:           199/124 mmHg Patient Gender: F                 HR:           73 bpm. Exam Location:  Inpatient Procedure: 2D Echo Indications:    stroke  History:        Patient has prior history of Echocardiogram examinations, most                 recent  12/07/2019. Risk Factors:Hypertension, Dyslipidemia and                 Diabetes.  Sonographer:    Johny Chess Referring Phys: [place]  1. Left ventricular ejection fraction, by estimation, is 60 to 65%. The left ventricle has normal function. The left ventricle has no regional wall motion abnormalities. There is moderate left ventricular hypertrophy. Left ventricular  diastolic parameters are consistent with Grade I diastolic dysfunction (impaired relaxation).  2. Right ventricular systolic function is normal. The right ventricular size is normal.  3. Left atrial size was mildly dilated.  4. The mitral valve is normal in structure. No evidence of mitral valve regurgitation. No evidence of mitral stenosis.  5. Left coronary cusp calcification. The aortic valve is tricuspid. There is mild calcification of the aortic valve. There is mild thickening of the aortic valve. Aortic valve regurgitation is moderate to severe. No aortic stenosis is present.  6. The inferior vena cava is normal in size with greater than 50% respiratory variability, suggesting right atrial pressure of 3 mmHg. Conclusion(s)/Recommendation(s): No intracardiac source of embolism detected on this transthoracic study. A transesophageal echocardiogram is recommended to exclude cardiac source of embolism if clinically indicated. FINDINGS  Left Ventricle: Left ventricular ejection fraction, by estimation, is 60 to 65%. The left ventricle has normal function. The left ventricle has no regional wall motion abnormalities. The left ventricular internal cavity size was normal in size. There is  moderate left ventricular hypertrophy. Left ventricular diastolic parameters are consistent with Grade I diastolic dysfunction (impaired relaxation). Right Ventricle: The right ventricular size is normal. No increase in right ventricular wall thickness. Right ventricular systolic function is normal. Left Atrium: Left atrial size was mildly  dilated. Right Atrium: Right atrial size was normal in size. Pericardium: There is no evidence of pericardial effusion. Mitral Valve: The mitral valve is normal in structure. No evidence of mitral valve regurgitation. No evidence of mitral valve stenosis. Tricuspid Valve: The tricuspid valve is normal in structure. Tricuspid valve regurgitation is not demonstrated. No evidence of tricuspid stenosis. Aortic Valve: Left coronary cusp calcification. The aortic valve is tricuspid. There is mild calcification of the aortic valve. There is mild thickening of the aortic valve. Aortic valve regurgitation is moderate to severe. Aortic regurgitation PHT measures 489 msec. No aortic stenosis is present. Pulmonic Valve: The pulmonic valve was normal in structure. Pulmonic valve regurgitation is not visualized. No evidence of pulmonic stenosis. Aorta: The aortic root is normal in size and structure. Venous: The inferior vena cava is normal in size with greater than 50% respiratory variability, suggesting right atrial pressure of 3 mmHg. IAS/Shunts: No atrial level shunt detected by color flow Doppler.  LEFT VENTRICLE PLAX 2D LVIDd:         3.90 cm LVIDs:         3.00 cm LV PW:         1.60 cm LV IVS:        1.30 cm LVOT diam:     2.00 cm LV SV:         75 LV SV Index:   43 LVOT Area:     3.14 cm  LV Volumes (MOD) LV vol d, MOD A2C: 81.6 ml LV vol s, MOD A2C: 34.5 ml LV SV MOD A2C:     47.1 ml RIGHT VENTRICLE             IVC RV S prime:     14.90 cm/s  IVC diam: 1.30 cm TAPSE (M-mode): 2.3 cm LEFT ATRIUM             Index       RIGHT ATRIUM           Index LA diam:        3.00 cm 1.70 cm/m  RA Area:     14.10 cm LA Vol (A2C):   61.3  ml 34.80 ml/m RA Volume:   30.60 ml  17.37 ml/m LA Vol (A4C):   56.4 ml 32.02 ml/m LA Biplane Vol: 59.9 ml 34.00 ml/m  AORTIC VALVE LVOT Vmax:   124.00 cm/s LVOT Vmean:  77.600 cm/s LVOT VTI:    0.239 m AI PHT:      489 msec  AORTA Ao Root diam: 2.60 cm Ao Asc diam:  3.40 cm MV E velocity:  66.70 cm/s MV A velocity: 132.00 cm/s  SHUNTS MV E/A ratio:  0.51         Systemic VTI:  0.24 m                             Systemic Diam: 2.00 cm Candee Furbish MD Electronically signed by Candee Furbish MD Signature Date/Time: 12/28/2020/11:31:32 AM    Final    CT HEAD CODE STROKE WO CONTRAST  Result Date: 12/27/2020 CLINICAL DATA:  Code stroke. Acute presentation with slurred speech. EXAM: CT HEAD WITHOUT CONTRAST TECHNIQUE: Contiguous axial images were obtained from the base of the skull through the vertex without intravenous contrast. COMPARISON:  MRI 12/06/2019.  CT 12/06/2019 FINDINGS: Brain: No abnormality seen affecting the brainstem or cerebellum. 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 Chronic small-vessel ischemic changes are present throughout the hemispheric white matter. This appears progressive since last year. No definitely acute stroke. One could not rule out the possibility of a recent small vessel infarction. No sign of large vessel or cortical infarction Vascular: There is atherosclerotic calcification of the major vessels at the base of the brain. Skull: Negative Sinuses/Orbits: Clear/normal Other: None ASPECTS ([place] Stroke Program Early CT Score) - Ganglionic level infarction (caudate, lentiform nuclei, internal capsule, insula, M1-M3 cortex): 7 - Supraganglionic infarction (M4-M6 cortex): 3 Total score (0-10 with 10 being normal): 10 IMPRESSION: 1. No sign of acute large vessel infarction. Chronic small-vessel ischemic changes throughout the brain, somewhat progressive since April of 2021. Cannot rule out the possibility of a more recent small vessel infarction. 2. ASPECTS is 10 3. These results were communicated to Dr. Cheral Marker at 6:04 pmon 5/2/2022by text page via the [facility] messaging system. Electronically Signed   By: Nelson Chimes M.D.   On: 12/27/2020 18:05   CT ANGIO HEAD CODE STROKE  Result Date: 12/27/2020 CLINICAL DATA:  Acute stroke presentation with slurred  speech. EXAM: CT ANGIOGRAPHY HEAD AND NECK TECHNIQUE: Multidetector CT imaging of the head and neck was performed using the standard protocol during bolus administration of intravenous contrast. Multiplanar CT image reconstructions and MIPs were obtained to evaluate the vascular anatomy. Carotid stenosis measurements (when applicable) are obtained utilizing NASCET criteria, using the distal internal carotid diameter as the denominator. CONTRAST:  7mL OMNIPAQUE IOHEXOL 350 MG/ML SOLN COMPARISON:  Head CT same day FINDINGS: CTA NECK FINDINGS Aortic arch: Aortic atherosclerosis. Branching pattern is normal without origin stenosis. Right carotid system: Common carotid artery widely patent to the bifurcation. Soft and calcified plaque at the carotid bifurcation and ICA bulb. No stenosis beyond the diameter of the more distal cervical ICA. Left carotid system: Common carotid artery widely patent to the bifurcation. Mild soft plaque at the carotid bifurcation and ICA bulb but no stenosis beyond the diameter of the more distal cervical ICA. Vertebral arteries: Both vertebral artery origins are widely patent. Both vertebral arteries appear normal through the cervical region to the foramen magnum. Skeleton: Ordinary cervical spondylosis. Other neck: No mass or lymphadenopathy. Dilated fluid  and air-filled esophagus. Upper chest: Abnormal density in the lateral aspect of the right upper lobe with solid and sub solid components measuring approximately 2.8 cm. This has a worrisome appearance and could possibly be neoplastic or infectious. Review of the MIP images confirms the above findings CTA HEAD FINDINGS Anterior circulation: Both internal carotid arteries are patent through the skull base and siphon regions. Siphon atherosclerotic calcification but without stenosis greater than 30%. The anterior and middle cerebral vessels are patent. No large or medium vessel occlusion is seen. Distal vessel narrowing and atherosclerotic  irregularity. Posterior circulation: Both vertebral arteries are patent to the basilar. The basilar is a small vessel. Both posterior cerebral arteries receive most of there supply from the anterior circulation. Distal vessel narrowing and irregularity within the PCA branches. Venous sinuses: Patent and normal. Anatomic variants: None significant. Review of the MIP images confirms the above findings IMPRESSION: No large or medium vessel occlusion. Atherosclerotic plaque at both carotid bifurcations but without stenosis. Intracranial distal branch vessel irregularity. Dilated fluid and air-filled esophagus. 2.8 cm solid and sub solid lesion in the lateral aspect of the right upper lobe. This could be pneumonia or, more concerning, a neoplastic lesion. Complete chest CT suggested when able. This may require further workup. Electronically Signed   By: Nelson Chimes M.D.   On: 12/27/2020 18:27   CT ANGIO NECK CODE STROKE  Result Date: 12/27/2020 CLINICAL DATA:  Acute stroke presentation with slurred speech. EXAM: CT ANGIOGRAPHY HEAD AND NECK TECHNIQUE: Multidetector CT imaging of the head and neck was performed using the standard protocol during bolus administration of intravenous contrast. Multiplanar CT image reconstructions and MIPs were obtained to evaluate the vascular anatomy. Carotid stenosis measurements (when applicable) are obtained utilizing NASCET criteria, using the distal internal carotid diameter as the denominator. CONTRAST:  9mL OMNIPAQUE IOHEXOL 350 MG/ML SOLN COMPARISON:  Head CT same day FINDINGS: CTA NECK FINDINGS Aortic arch: Aortic atherosclerosis. Branching pattern is normal without origin stenosis. Right carotid system: Common carotid artery widely patent to the bifurcation. Soft and calcified plaque at the carotid bifurcation and ICA bulb. No stenosis beyond the diameter of the more distal cervical ICA. Left carotid system: Common carotid artery widely patent to the bifurcation. Mild soft  plaque at the carotid bifurcation and ICA bulb but no stenosis beyond the diameter of the more distal cervical ICA. Vertebral arteries: Both vertebral artery origins are widely patent. Both vertebral arteries appear normal through the cervical region to the foramen magnum. Skeleton: Ordinary cervical spondylosis. Other neck: No mass or lymphadenopathy. Dilated fluid and air-filled esophagus. Upper chest: Abnormal density in the lateral aspect of the right upper lobe with solid and sub solid components measuring approximately 2.8 cm. This has a worrisome appearance and could possibly be neoplastic or infectious. Review of the MIP images confirms the above findings CTA HEAD FINDINGS Anterior circulation: Both internal carotid arteries are patent through the skull base and siphon regions. Siphon atherosclerotic calcification but without stenosis greater than 30%. The anterior and middle cerebral vessels are patent. No large or medium vessel occlusion is seen. Distal vessel narrowing and atherosclerotic irregularity. Posterior circulation: Both vertebral arteries are patent to the basilar. The basilar is a small vessel. Both posterior cerebral arteries receive most of there supply from the anterior circulation. Distal vessel narrowing and irregularity within the PCA branches. Venous sinuses: Patent and normal. Anatomic variants: None significant. Review of the MIP images confirms the above findings IMPRESSION: No large or medium vessel occlusion. Atherosclerotic plaque  at both carotid bifurcations but without stenosis. Intracranial distal branch vessel irregularity. Dilated fluid and air-filled esophagus. 2.8 cm solid and sub solid lesion in the lateral aspect of the right upper lobe. This could be pneumonia or, more concerning, a neoplastic lesion. Complete chest CT suggested when able. This may require further workup. Electronically Signed   By: Nelson Chimes M.D.   On: 12/27/2020 18:27     Scheduled Meds: .  acetaminophen  650 mg Oral Once  . ARIPiprazole  5 mg Oral Daily  . aspirin  81 mg Oral Daily  . atorvastatin  80 mg Oral q1800  . chlorhexidine  15 mL Mouth Rinse BID  . enoxaparin (LOVENOX) injection  40 mg Subcutaneous Q24H  . insulin aspart  0-9 Units Subcutaneous Q4H  . mouth rinse  15 mL Mouth Rinse q12n4p  . mirtazapine  15 mg Oral QHS   Continuous Infusions:   LOS: 2 days   Time Spent in minutes   45 minutes  Vernell Leep, MD, [place], [facility]. [facility]  To contact the attending provider between 7A-7P or the covering provider during after hours 7P-7A, please log into the web site [email or web address] and access using universal [place] password for that web site. If you do not have the password, please call the hospital operator.

## 2020-12-29 NOTE — Progress Notes (Signed)
Inpatient Rehab Admissions Coordinator:   I met with pt. To discuss potential [facility] admit. She stated that she is homeless and has no family who can assist her at d/c. I called her brother and the two friends listed in her chart and they all stated that they are unable to assist Pt. At discharge. Pt. Would benefit from short-term rehab at SNF, as [facility] cannot accept her without a home discharge plan. CIR will sign off.   Clemens Catholic, [place], [place] Admissions Coordinator  [phone] ([place]) [phone] (office)

## 2020-12-30 ENCOUNTER — Inpatient Hospital Stay (HOSPITAL_COMMUNITY): Payer: Medicare Other

## 2020-12-30 ENCOUNTER — Other Ambulatory Visit: Payer: Self-pay

## 2020-12-30 ENCOUNTER — Encounter (HOSPITAL_COMMUNITY): Payer: Self-pay | Admitting: Internal Medicine

## 2020-12-30 DIAGNOSIS — I6930 Unspecified sequelae of cerebral infarction: Secondary | ICD-10-CM | POA: Diagnosis not present

## 2020-12-30 DIAGNOSIS — G8194 Hemiplegia, unspecified affecting left nondominant side: Secondary | ICD-10-CM | POA: Diagnosis not present

## 2020-12-30 DIAGNOSIS — R1312 Dysphagia, oropharyngeal phase: Secondary | ICD-10-CM | POA: Diagnosis not present

## 2020-12-30 DIAGNOSIS — R1310 Dysphagia, unspecified: Secondary | ICD-10-CM | POA: Diagnosis not present

## 2020-12-30 DIAGNOSIS — I639 Cerebral infarction, unspecified: Secondary | ICD-10-CM | POA: Diagnosis not present

## 2020-12-30 LAB — BASIC METABOLIC PANEL
Anion gap: 10 (ref 5–15)
BUN: 11 mg/dL (ref 6–20)
CO2: 24 mmol/L (ref 22–32)
Calcium: 9.1 mg/dL (ref 8.9–10.3)
Chloride: 103 mmol/L (ref 98–111)
Creatinine, Ser: 1 mg/dL (ref 0.44–1.00)
GFR, Estimated: >60 mL/min (ref >60)
Glucose, Bld: 135 mg/dL — ABNORMAL HIGH (ref 70–99)
Potassium: 3.1 mmol/L — ABNORMAL LOW (ref 3.5–5.1)
Sodium: 137 mmol/L (ref 135–145)

## 2020-12-30 LAB — MAGNESIUM: Magnesium: 2.1 mg/dL (ref 1.7–2.4)

## 2020-12-30 LAB — GLUCOSE, CAPILLARY
Glucose-Capillary: 144 mg/dL — ABNORMAL HIGH (ref 70–99)
Glucose-Capillary: 167 mg/dL — ABNORMAL HIGH (ref 70–99)
Glucose-Capillary: 164 mg/dL — ABNORMAL HIGH (ref 70–99)
Glucose-Capillary: 176 mg/dL — ABNORMAL HIGH (ref 70–99)
Glucose-Capillary: 176 mg/dL — ABNORMAL HIGH (ref 70–99)

## 2020-12-30 MED ORDER — POTASSIUM CHLORIDE CRYS ER 20 MEQ PO TBCR
40.0000 meq | EXTENDED_RELEASE_TABLET | ORAL | Status: AC
  Administered 2020-12-30 (×2): 40 meq via ORAL
  Filled 2020-12-30 (×2): qty 2

## 2020-12-30 MED ORDER — HYDRALAZINE HCL 50 MG PO TABS
100.0000 mg | ORAL_TABLET | Freq: Three times a day (TID) | ORAL | Status: DC
  Administered 2020-12-30 – 2021-01-03 (×12): 100 mg via ORAL
  Filled 2020-12-30 (×13): qty 2

## 2020-12-30 MED ORDER — SODIUM CHLORIDE 0.9 % IV SOLN: INTRAVENOUS | Status: DC

## 2020-12-30 MED ORDER — POTASSIUM CHLORIDE 10 MEQ/100ML IV SOLN
10.0000 meq | INTRAVENOUS | Status: AC
  Administered 2020-12-30 (×4): 10 meq via INTRAVENOUS
  Filled 2020-12-30 (×4): qty 100

## 2020-12-30 MED ORDER — LISINOPRIL 20 MG PO TABS
40.0000 mg | ORAL_TABLET | Freq: Every day | ORAL | Status: DC
  Administered 2020-12-30 – 2021-01-03 (×5): 40 mg via ORAL
  Filled 2020-12-30 (×5): qty 2

## 2020-12-30 NOTE — Progress Notes (Addendum)
STROKE TEAM PROGRESS NOTE   SUBJECTIVE (INTERVAL HISTORY) Called stroke activated for this patient around 7:40am. Per RN, pt was at her baseline around 6:58am. However, on shift change signing off time, pt was found at 7:27am with difficulty getting worse out, stutting, all extremities weaker than baseline, left still weaker than the right. Pt still able to understand everything, but slow respond and difficult to have words out. Pt was sent to for further imaging study. Pt started to cry before transporting to CT/MRI suite. MRI negative for acute changes but still has small evolving left CR infarct. After MRI, pt symptom improved and continue to improve. Crying stopped.   OBJECTIVE Temp:  [98.1 F (36.7 C)-99 F (37.2 C)] 98.1 F (36.7 C) (05/05 0420) Pulse Rate:  [70-82] 70 (05/05 0738) Cardiac Rhythm: Heart block (05/05 0736) Resp:  [16-20] 18 (05/05 0738) BP: (139-217)/(103-129) 139/129 (05/05 0738) SpO2:  [100 %] 100 % (05/05 0738)  Recent Labs  Lab 12/29/20 0732 12/29/20 1121 12/29/20 1523 12/30/20 0623 12/30/20 0726  GLUCAP 105* 186* 157* 144* 167*   Recent Labs  Lab 12/27/20 1749 12/27/20 1755 12/29/20 0230 12/30/20 0406  NA 138 140 138 137  K 3.5 3.5 3.2* 3.1*  CL 104 103 104 103  CO2 25  --  28 24  GLUCOSE 143* 143* 126* 135*  BUN 11 12 11 11   CREATININE 1.04* 0.90 1.02* 1.00  CALCIUM 9.4  --  9.2 9.1  MG  --   --   --  2.1   Recent Labs  Lab 12/27/20 1749  AST 11*  ALT 8  ALKPHOS 66  BILITOT 0.5  PROT 8.1  ALBUMIN 3.5   Recent Labs  Lab 12/27/20 1749 12/27/20 1755 12/29/20 0230  WBC 6.9  --  6.5  NEUTROABS 3.2  --   --   HGB 12.5 13.9 12.3  HCT 39.8 41.0 39.0  MCV 73.8*  --  73.0*  PLT 264  --  257   No results for input(s): CKTOTAL, CKMB, CKMBINDEX, TROPONINI in the last 168 hours. Recent Labs    12/27/20 1749  LABPROT 13.6  INR 1.0   Recent Labs    12/27/20 1930  COLORURINE STRAW*  LABSPEC 1.023  PHURINE 8.0  GLUCOSEU 50*  HGBUR  NEGATIVE  BILIRUBINUR NEGATIVE  KETONESUR NEGATIVE  PROTEINUR 30*  NITRITE NEGATIVE  LEUKOCYTESUR NEGATIVE       Component Value Date/Time   CHOL 209 (H) 12/28/2020 0349   TRIG 63 12/28/2020 0349   HDL 60 12/28/2020 0349   CHOLHDL 3.5 12/28/2020 0349   VLDL 13 12/28/2020 0349   LDLCALC 136 (H) 12/28/2020 0349   Lab Results  Component Value Date   HGBA1C 6.6 (H) 12/28/2020      Component Value Date/Time   LABOPIA NONE DETECTED 12/27/2020 1930   COCAINSCRNUR NONE DETECTED 12/27/2020 1930   LABBENZ NONE DETECTED 12/27/2020 1930   AMPHETMU NONE DETECTED 12/27/2020 1930   THCU POSITIVE (A) 12/27/2020 1930   LABBARB NONE DETECTED 12/27/2020 1930    Recent Labs  Lab 12/27/20 1749  ETH <10    I have personally reviewed the radiological images below and agree with the radiology interpretations.  CT CHEST W CONTRAST  Result Date: 12/29/2020 CLINICAL DATA:  Lung nodule seen on CT angiography of the head and neck. EXAM: CT CHEST WITH CONTRAST TECHNIQUE: Multidetector CT imaging of the chest was performed during intravenous contrast administration. CONTRAST:  40mL OMNIPAQUE IOHEXOL 300 MG/ML  SOLN COMPARISON:  CT  a head neck 12/27/2020, chest radiograph 12/27/2020, CT a chest 06/22/2018 FINDINGS: Cardiovascular: Mild cardiomegaly. No pericardial effusion. Scattered coronary artery calcifications. Pulmonary arteries are normal caliber. No large central filling defects are identified on this non tailored examination of the pulmonary arteries. Atherosclerotic plaque within the normal caliber aorta. No acute luminal abnormality of the imaged aorta. No periaortic stranding or hemorrhage. Shared origin of the brachiocephalic and left common carotid arteries. Minimal calcifications in the proximal great vessels. No major venous abnormalities. Suspect laminar flow mixing artifact in the internal jugular veins. No significant venous reflux. Mediastinum/Nodes: Redemonstration of a patulous, distended  thoracic esophagus which contains layering air-fluid levels and ingested material to the level of the thoracic inlet. No acute tracheal abnormality. Thyroid gland and thoracic inlet is unremarkable. No worrisome mediastinal, hilar or axillary adenopathy. Lungs/Pleura: Corresponding well to the opacity seen on the CT angiography of the head neck are several patchy ground-glass and tree-in-bud opacities present in the right upper lobe in a somewhat branching configuration, favored to reflect some acute infectious or inflammatory process including sequela of aspiration. No other concerning pulmonary nodules or masses. No other consolidative process. No pneumothorax or visible effusion. No convincing CT evidence of edema. Some bandlike opacity in the lung bases, likely atelectasis or scarring. Upper Abdomen: High attenuation enteric contrast media layers dependently within the stomach. No acute abnormalities present in the visualized portions of the upper abdomen. Scattered colonic diverticula without focal inflammation to suggest diverticulitis. Musculoskeletal: Multilevel degenerative changes are present in the imaged portions of the spine. Multilevel flowing anterior osteophytosis, compatible with features of diffuse idiopathic skeletal hyperostosis (DISH). Additional degenerative changes in the bilateral shoulders. Features of right calcific tendinosis/hydroxyapatite deposition. No acute or worrisome osseous abnormalities. Mild dextrocurvature of the spine. No worrisome chest wall mass or lesion. IMPRESSION: There are scattered mixed ground-glass and tree-in-bud nodular opacities predominantly which appear confined to the right upper lobe. Appearance favors an infectious or inflammatory process including possible sequela of aspiration given a chronically patulous and distended thoracic esophagus containing admixed air and ingested material to the level of the thoracic inlet, which is a similar appearance to  comparison CT from 2019. Recommend follow-up noncontrast CT imaging to ensure resolution after therapeutic trial. No other acute intrathoracic process. No other concerning pulmonary nodules or masses. Cardiomegaly.  Coronary artery atherosclerosis. Aortic Atherosclerosis (ICD10-I70.0). Colonic diverticulosis. Multilevel degenerative changes in the spine including features of diffuse idiopathic skeletal hyperostosis. Suspect some hydroxyapatite deposition/calcific tendinosis of the right shoulder. Electronically Signed   By: Kreg Shropshire M.D.   On: 12/29/2020 03:12   MR BRAIN WO CONTRAST  Result Date: 12/27/2020 CLINICAL DATA:  Acute stroke presentation with slurred speech. EXAM: MRI HEAD WITHOUT CONTRAST TECHNIQUE: Multiplanar, multiecho pulse sequences of the brain and surrounding structures were obtained without intravenous contrast. COMPARISON:  CT studies earlier same day.  MRI 12/06/2019. FINDINGS: Brain: Diffusion imaging shows a 4-5 mm acute infarction within the left upper centrum semiovale region. No other acute infarction. Chronic small-vessel ischemic changes affect pons. Old small vessel cerebellar infarctions. Old small vessel infarctions of the thalami and basal ganglia. Hemosiderin deposition in the right basal ganglia and radiating white matter tracts. Chronic small-vessel ischemic changes of the hemispheric white matter. No large vessel territory infarction. No mass lesion, acute hemorrhage, hydrocephalus or extra-axial collection. Vascular: Major vessels at the base of the brain show flow. Skull and upper cervical spine: Negative Sinuses/Orbits: Clear/normal Other: None IMPRESSION: 4-5 mm acute infarction of the left hemispheric white matter  in the upper parietal centrum semiovale region. No large or cortical infarction. Extensive chronic ischemic changes elsewhere throughout the brain as outlined above. Electronically Signed   By: Nelson Chimes M.D.   On: 12/27/2020 20:50   DG Chest Port 1  View  Result Date: 12/27/2020 CLINICAL DATA:  Altered mental status, indistinct focal opacity on neck CT angiogram EXAM: PORTABLE CHEST 1 VIEW COMPARISON:  10/27/2020 chest radiograph. FINDINGS: Stable cardiomediastinal silhouette with mild cardiomegaly. No pneumothorax. No pleural effusion. Indistinct faint opacity in the apical right lung. Otherwise clear lungs with no overt pulmonary edema. IMPRESSION: Stable mild cardiomegaly without overt pulmonary edema. Indistinct faint opacity in the apical right lung, correlating to the site of mixed density opacity on the neck CT angiogram from earlier today. Short-term follow-up dedicated chest CT recommended when clinically feasible. Electronically Signed   By: Ilona Sorrel M.D.   On: 12/27/2020 19:11   DG Swallowing Func-Speech Pathology  Result Date: 12/28/2020 Objective Swallowing Evaluation: Type of Study: MBS-Modified Barium Swallow Study  Patient Details Name: Otila Manganiello MRN: [id number] Date of Birth: February 09, 1971 Today's Date: 12/28/2020 Time: SLP Start Time (ACUTE ONLY): [id number] -SLP Stop Time (ACUTE ONLY): 1400 SLP Time Calculation (min) (ACUTE ONLY): 14 min Past Medical History: Past Medical History: Diagnosis Date . Anxiety  . Chronic lower back pain 04/17/2012  "just got over some; catched when I walked" . COPD (chronic obstructive pulmonary disease) ([place])  . Critical lower limb ischemia ([place]) 05/27/2018 . Depression  . Headache(784.0) 04/17/2012  "~ qod; lately waking up in am w/one" . High cholesterol  . Hypertension  . Migraines 04/17/2012 . Obesity  . Sleep apnea   CPAP . Stroke ([place])  . Type II diabetes mellitus ([place]) 08/28/2002 Past Surgical History: Past Surgical History: Procedure Laterality Date . ABDOMINAL AORTOGRAM W/LOWER EXTREMITY Left 05/27/2018  Procedure: ABDOMINAL AORTOGRAM W/LOWER EXTREMITY Runoff and Possible Intervention;  Surgeon: Waynetta Sandy, MD;  Location: [place] [facility];  Service: Cardiovascular;  Laterality: Left; .  APPLICATION OF WOUND VAC Left 0000000  Procedure: APPLICATION OF WOUND VAC;  Surgeon: Waynetta Sandy, MD;  Location: [place];  Service: Vascular;  Laterality: Left; . BIOPSY  04/04/2018  Procedure: BIOPSY;  Surgeon: [name], MD;  Location: [facility] ENDOSCOPY;  Service: Endoscopy;; . BIOPSY  10/27/2020  Procedure: BIOPSY;  Surgeon: Arta Silence, MD;  Location: [facility] ENDOSCOPY;  Service: Endoscopy;; . CARDIAC CATHETERIZATION  ~ 2011 . COLONOSCOPY N/A 04/04/2018  Procedure: COLONOSCOPY;  Surgeon: [name], MD;  Location: [facility] ENDOSCOPY;  Service: Endoscopy;  Laterality: N/A; . COLONOSCOPY WITH PROPOFOL N/A 10/27/2020  Procedure: COLONOSCOPY WITH PROPOFOL;  Surgeon: Arta Silence, MD;  Location: [facility] ENDOSCOPY;  Service: Endoscopy;  Laterality: N/A; . ESOPHAGOGASTRODUODENOSCOPY (EGD) WITH PROPOFOL N/A 10/27/2020  Procedure: ESOPHAGOGASTRODUODENOSCOPY (EGD) WITH PROPOFOL  WITH POSSIBLE DIL;  Surgeon: Arta Silence, MD;  Location: [facility] ENDOSCOPY;  Service: Endoscopy;  Laterality: N/A; . LOWER EXTREMITY ANGIOGRAM Left 05/27/2018 . PERIPHERAL VASCULAR INTERVENTION Left 05/27/2018  Procedure: PERIPHERAL VASCULAR INTERVENTION;  Surgeon: Waynetta Sandy, MD;  Location: [place] [facility];  Service: Cardiovascular;  Laterality: Left; . POLYPECTOMY  04/04/2018  Procedure: POLYPECTOMY;  Surgeon: [name], MD;  Location: [facility] ENDOSCOPY;  Service: Endoscopy;; . POLYPECTOMY  10/27/2020  Procedure: POLYPECTOMY;  Surgeon: Arta Silence, MD;  Location: [facility] ENDOSCOPY;  Service: Endoscopy;; . TRANSMETATARSAL AMPUTATION Left 05/28/2018  Procedure: AMPUTATION TOES THREE, FOUR AND FIVE on left foot;  Surgeon: Waynetta Sandy, MD;  Location: [place];  Service: Vascular;  Laterality:  Left; . WOUND DEBRIDEMENT Left 06/06/2018  Procedure: DEBRIDEMENT WOUND LEFT FOOT;  Surgeon: Waynetta Sandy, MD;  Location: [facility] OR;  Service: Vascular;  Laterality: Left; HPI: Pt is a 50 yo female with a h/o 4 prior strokes presents  wtih an acute worsening of BLE pain and dysarthria. MRI showed an acute infarction of the L hemispheric white matter in the upper parietal centrum semiovale region. PMH also includes: DM2, HTN, HLD, smoking, prior cocaine abuse, PE, medication nonadhering. Most recent MBS 12/08/19 revealed aspiration of thin liquids (PAS 7, 8) with recs for Dys 2/Nectar. She reports advancing to regular solids/thin liquids at [facility] after FEES. Pt also had dysarthria at that time impacting word to short phrase level.  Subjective: eager to eat and drink Assessment / Plan / Recommendation CHL IP CLINICAL IMPRESSIONS 12/28/2020 Clinical Impression Pt presents with a mild oral dysphagia with pharyngeal phase relatively WFL. She has intermittent oral holding with liquids, with mild lingual residue also noted primarily on her first few sips. She also has loss of regular solids to the pyriform sinuses while she is still masticating, but there is no aspiration across all consistencies. Recommend starting a diet consisting of regular solids and thin liquids. Given coughing noted during initial evaluation, will f/u briefly. SLP Visit Diagnosis Dysphagia, oral phase (R13.11) Attention and concentration deficit following -- Frontal lobe and executive function deficit following -- Impact on safety and function Mild aspiration risk   CHL IP TREATMENT RECOMMENDATION 12/28/2020 Treatment Recommendations Therapy as outlined in treatment plan below   Prognosis 12/28/2020 Prognosis for Safe Diet Advancement Good Barriers to Reach Goals -- Barriers/Prognosis Comment -- CHL IP DIET RECOMMENDATION 12/28/2020 SLP Diet Recommendations Regular solids;Thin liquid Liquid Administration via Cup;Straw Medication Administration Whole meds with liquid Compensations Slow rate;Small sips/bites Postural Changes Seated upright at 90 degrees;Remain semi-upright after after feeds/meals (Comment)   CHL IP OTHER RECOMMENDATIONS 12/28/2020 Recommended Consults -- Oral Care  Recommendations Oral care BID Other Recommendations --   CHL IP FOLLOW UP RECOMMENDATIONS 12/28/2020 Follow up Recommendations Inpatient Rehab   CHL IP FREQUENCY AND DURATION 12/28/2020 Speech Therapy Frequency (ACUTE ONLY) min 2x/week Treatment Duration 2 weeks      CHL IP ORAL PHASE 12/28/2020 Oral Phase Impaired Oral - Pudding Teaspoon -- Oral - Pudding Cup -- Oral - Honey Teaspoon -- Oral - Honey Cup -- Oral - Nectar Teaspoon -- Oral - Nectar Cup -- Oral - Nectar Straw -- Oral - Thin Teaspoon -- Oral - Thin Cup Lingual/palatal residue;Holding of bolus Oral - Thin Straw Holding of bolus Oral - Puree WFL Oral - Mech Soft -- Oral - Regular Premature spillage;Impaired mastication Oral - Multi-Consistency -- Oral - Pill WFL Oral Phase - Comment --  CHL IP PHARYNGEAL PHASE 12/28/2020 Pharyngeal Phase WFL Pharyngeal- Pudding Teaspoon -- Pharyngeal -- Pharyngeal- Pudding Cup -- Pharyngeal -- Pharyngeal- Honey Teaspoon -- Pharyngeal -- Pharyngeal- Honey Cup -- Pharyngeal -- Pharyngeal- Nectar Teaspoon -- Pharyngeal -- Pharyngeal- Nectar Cup -- Pharyngeal -- Pharyngeal- Nectar Straw -- Pharyngeal -- Pharyngeal- Thin Teaspoon -- Pharyngeal -- Pharyngeal- Thin Cup -- Pharyngeal -- Pharyngeal- Thin Straw -- Pharyngeal -- Pharyngeal- Puree -- Pharyngeal -- Pharyngeal- Mechanical Soft -- Pharyngeal -- Pharyngeal- Regular -- Pharyngeal -- Pharyngeal- Multi-consistency -- Pharyngeal -- Pharyngeal- Pill -- Pharyngeal -- Pharyngeal Comment --  CHL IP CERVICAL ESOPHAGEAL PHASE 12/28/2020 Cervical Esophageal Phase WFL Pudding Teaspoon -- Pudding Cup -- Honey Teaspoon -- Honey Cup -- Nectar Teaspoon -- Nectar Cup -- Nectar Straw -- Thin Teaspoon -- Thin  Cup -- Thin Straw -- Puree -- Mechanical Soft -- Regular -- Multi-consistency -- Pill -- Cervical Esophageal Comment -- Mahala MenghiniLaura N., M.A. CCC-SLP Acute Rehabilitation Services Pager [phone] Office [phone] 12/28/2020, 3:02 PM              ECHOCARDIOGRAM COMPLETE  Result Date:  12/28/2020    ECHOCARDIOGRAM REPORT   Patient Name:   Cletis MediaRHONDA KAY Posa Date of Exam: 12/28/2020 Medical Rec #:  [id number]         Height:       61.0 in Accession #:    [id number]        Weight:       169.8 lb Date of Birth:  07/31/1971          BSA:          1.762 m Patient Age:    49 years          BP:           199/124 mmHg Patient Gender: F                 HR:           73 bpm. Exam Location:  Inpatient Procedure: 2D Echo Indications:    stroke  History:        Patient has prior history of Echocardiogram examinations, most                 recent 12/07/2019. Risk Factors:Hypertension, Dyslipidemia and                 Diabetes.  Sonographer:    Delcie RochLauren Pennington Referring Phys: [phone] JARED M GARDNER IMPRESSIONS  1. Left ventricular ejection fraction, by estimation, is 60 to 65%. The left ventricle has normal function. The left ventricle has no regional wall motion abnormalities. There is moderate left ventricular hypertrophy. Left ventricular diastolic parameters are consistent with Grade I diastolic dysfunction (impaired relaxation).  2. Right ventricular systolic function is normal. The right ventricular size is normal.  3. Left atrial size was mildly dilated.  4. The mitral valve is normal in structure. No evidence of mitral valve regurgitation. No evidence of mitral stenosis.  5. Left coronary cusp calcification. The aortic valve is tricuspid. There is mild calcification of the aortic valve. There is mild thickening of the aortic valve. Aortic valve regurgitation is moderate to severe. No aortic stenosis is present.  6. The inferior vena cava is normal in size with greater than 50% respiratory variability, suggesting right atrial pressure of 3 mmHg. Conclusion(s)/Recommendation(s): No intracardiac source of embolism detected on this transthoracic study. A transesophageal echocardiogram is recommended to exclude cardiac source of embolism if clinically indicated. FINDINGS  Left Ventricle: Left ventricular ejection  fraction, by estimation, is 60 to 65%. The left ventricle has normal function. The left ventricle has no regional wall motion abnormalities. The left ventricular internal cavity size was normal in size. There is  moderate left ventricular hypertrophy. Left ventricular diastolic parameters are consistent with Grade I diastolic dysfunction (impaired relaxation). Right Ventricle: The right ventricular size is normal. No increase in right ventricular wall thickness. Right ventricular systolic function is normal. Left Atrium: Left atrial size was mildly dilated. Right Atrium: Right atrial size was normal in size. Pericardium: There is no evidence of pericardial effusion. Mitral Valve: The mitral valve is normal in structure. No evidence of mitral valve regurgitation. No evidence of mitral valve stenosis. Tricuspid Valve: The tricuspid valve is normal in structure. Tricuspid valve regurgitation is  not demonstrated. No evidence of tricuspid stenosis. Aortic Valve: Left coronary cusp calcification. The aortic valve is tricuspid. There is mild calcification of the aortic valve. There is mild thickening of the aortic valve. Aortic valve regurgitation is moderate to severe. Aortic regurgitation PHT measures 489 msec. No aortic stenosis is present. Pulmonic Valve: The pulmonic valve was normal in structure. Pulmonic valve regurgitation is not visualized. No evidence of pulmonic stenosis. Aorta: The aortic root is normal in size and structure. Venous: The inferior vena cava is normal in size with greater than 50% respiratory variability, suggesting right atrial pressure of 3 mmHg. IAS/Shunts: No atrial level shunt detected by color flow Doppler.  LEFT VENTRICLE PLAX 2D LVIDd:         3.90 cm LVIDs:         3.00 cm LV PW:         1.60 cm LV IVS:        1.30 cm LVOT diam:     2.00 cm LV SV:         75 LV SV Index:   43 LVOT Area:     3.14 cm  LV Volumes (MOD) LV vol d, MOD A2C: 81.6 ml LV vol s, MOD A2C: 34.5 ml LV SV MOD A2C:      47.1 ml RIGHT VENTRICLE             IVC RV S prime:     14.90 cm/s  IVC diam: 1.30 cm TAPSE (M-mode): 2.3 cm LEFT ATRIUM             Index       RIGHT ATRIUM           Index LA diam:        3.00 cm 1.70 cm/m  RA Area:     14.10 cm LA Vol (A2C):   61.3 ml 34.80 ml/m RA Volume:   30.60 ml  17.37 ml/m LA Vol (A4C):   56.4 ml 32.02 ml/m LA Biplane Vol: 59.9 ml 34.00 ml/m  AORTIC VALVE LVOT Vmax:   124.00 cm/s LVOT Vmean:  77.600 cm/s LVOT VTI:    0.239 m AI PHT:      489 msec  AORTA Ao Root diam: 2.60 cm Ao Asc diam:  3.40 cm MV E velocity: 66.70 cm/s MV A velocity: 132.00 cm/s  SHUNTS MV E/A ratio:  0.51         Systemic VTI:  0.24 m                             Systemic Diam: 2.00 cm Candee Furbish MD Electronically signed by Candee Furbish MD Signature Date/Time: 12/28/2020/11:31:32 AM    Final    CT HEAD CODE STROKE WO CONTRAST  Result Date: 12/27/2020 CLINICAL DATA:  Code stroke. Acute presentation with slurred speech. EXAM: CT HEAD WITHOUT CONTRAST TECHNIQUE: Contiguous axial images were obtained from the base of the skull through the vertex without intravenous contrast. COMPARISON:  MRI 12/06/2019.  CT 12/06/2019 FINDINGS: Brain: No abnormality seen affecting the brainstem or cerebellum. 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 Chronic small-vessel ischemic changes are present throughout the hemispheric white matter. This appears progressive since last year. No definitely acute stroke. One could not rule out the possibility of a recent small vessel infarction. No sign of large vessel or cortical infarction Vascular: There is atherosclerotic calcification of the major vessels at the base of the brain.  Skull: Negative Sinuses/Orbits: Clear/normal Other: None ASPECTS ([place] Stroke Program Early CT Score) - Ganglionic level infarction (caudate, lentiform nuclei, internal capsule, insula, M1-M3 cortex): 7 - Supraganglionic infarction (M4-M6 cortex): 3 Total score (0-10 with 10 being normal):  10 IMPRESSION: 1. No sign of acute large vessel infarction. Chronic small-vessel ischemic changes throughout the brain, somewhat progressive since April of 2021. Cannot rule out the possibility of a more recent small vessel infarction. 2. ASPECTS is 10 3. These results were communicated to Dr. Cheral Marker at 6:04 pmon 5/2/2022by text page via the [facility] messaging system. Electronically Signed   By: Nelson Chimes M.D.   On: 12/27/2020 18:05   CT ANGIO HEAD CODE STROKE  Result Date: 12/27/2020 CLINICAL DATA:  Acute stroke presentation with slurred speech. EXAM: CT ANGIOGRAPHY HEAD AND NECK TECHNIQUE: Multidetector CT imaging of the head and neck was performed using the standard protocol during bolus administration of intravenous contrast. Multiplanar CT image reconstructions and MIPs were obtained to evaluate the vascular anatomy. Carotid stenosis measurements (when applicable) are obtained utilizing NASCET criteria, using the distal internal carotid diameter as the denominator. CONTRAST:  7mL OMNIPAQUE IOHEXOL 350 MG/ML SOLN COMPARISON:  Head CT same day FINDINGS: CTA NECK FINDINGS Aortic arch: Aortic atherosclerosis. Branching pattern is normal without origin stenosis. Right carotid system: Common carotid artery widely patent to the bifurcation. Soft and calcified plaque at the carotid bifurcation and ICA bulb. No stenosis beyond the diameter of the more distal cervical ICA. Left carotid system: Common carotid artery widely patent to the bifurcation. Mild soft plaque at the carotid bifurcation and ICA bulb but no stenosis beyond the diameter of the more distal cervical ICA. Vertebral arteries: Both vertebral artery origins are widely patent. Both vertebral arteries appear normal through the cervical region to the foramen magnum. Skeleton: Ordinary cervical spondylosis. Other neck: No mass or lymphadenopathy. Dilated fluid and air-filled esophagus. Upper chest: Abnormal density in the lateral aspect of the right upper  lobe with solid and sub solid components measuring approximately 2.8 cm. This has a worrisome appearance and could possibly be neoplastic or infectious. Review of the MIP images confirms the above findings CTA HEAD FINDINGS Anterior circulation: Both internal carotid arteries are patent through the skull base and siphon regions. Siphon atherosclerotic calcification but without stenosis greater than 30%. The anterior and middle cerebral vessels are patent. No large or medium vessel occlusion is seen. Distal vessel narrowing and atherosclerotic irregularity. Posterior circulation: Both vertebral arteries are patent to the basilar. The basilar is a small vessel. Both posterior cerebral arteries receive most of there supply from the anterior circulation. Distal vessel narrowing and irregularity within the PCA branches. Venous sinuses: Patent and normal. Anatomic variants: None significant. Review of the MIP images confirms the above findings IMPRESSION: No large or medium vessel occlusion. Atherosclerotic plaque at both carotid bifurcations but without stenosis. Intracranial distal branch vessel irregularity. Dilated fluid and air-filled esophagus. 2.8 cm solid and sub solid lesion in the lateral aspect of the right upper lobe. This could be pneumonia or, more concerning, a neoplastic lesion. Complete chest CT suggested when able. This may require further workup. Electronically Signed   By: Nelson Chimes M.D.   On: 12/27/2020 18:27   CT ANGIO NECK CODE STROKE  Result Date: 12/27/2020 CLINICAL DATA:  Acute stroke presentation with slurred speech. EXAM: CT ANGIOGRAPHY HEAD AND NECK TECHNIQUE: Multidetector CT imaging of the head and neck was performed using the standard protocol during bolus administration of intravenous contrast. Multiplanar CT image  reconstructions and MIPs were obtained to evaluate the vascular anatomy. Carotid stenosis measurements (when applicable) are obtained utilizing NASCET criteria, using the  distal internal carotid diameter as the denominator. CONTRAST:  73mL OMNIPAQUE IOHEXOL 350 MG/ML SOLN COMPARISON:  Head CT same day FINDINGS: CTA NECK FINDINGS Aortic arch: Aortic atherosclerosis. Branching pattern is normal without origin stenosis. Right carotid system: Common carotid artery widely patent to the bifurcation. Soft and calcified plaque at the carotid bifurcation and ICA bulb. No stenosis beyond the diameter of the more distal cervical ICA. Left carotid system: Common carotid artery widely patent to the bifurcation. Mild soft plaque at the carotid bifurcation and ICA bulb but no stenosis beyond the diameter of the more distal cervical ICA. Vertebral arteries: Both vertebral artery origins are widely patent. Both vertebral arteries appear normal through the cervical region to the foramen magnum. Skeleton: Ordinary cervical spondylosis. Other neck: No mass or lymphadenopathy. Dilated fluid and air-filled esophagus. Upper chest: Abnormal density in the lateral aspect of the right upper lobe with solid and sub solid components measuring approximately 2.8 cm. This has a worrisome appearance and could possibly be neoplastic or infectious. Review of the MIP images confirms the above findings CTA HEAD FINDINGS Anterior circulation: Both internal carotid arteries are patent through the skull base and siphon regions. Siphon atherosclerotic calcification but without stenosis greater than 30%. The anterior and middle cerebral vessels are patent. No large or medium vessel occlusion is seen. Distal vessel narrowing and atherosclerotic irregularity. Posterior circulation: Both vertebral arteries are patent to the basilar. The basilar is a small vessel. Both posterior cerebral arteries receive most of there supply from the anterior circulation. Distal vessel narrowing and irregularity within the PCA branches. Venous sinuses: Patent and normal. Anatomic variants: None significant. Review of the MIP images confirms the  above findings IMPRESSION: No large or medium vessel occlusion. Atherosclerotic plaque at both carotid bifurcations but without stenosis. Intracranial distal branch vessel irregularity. Dilated fluid and air-filled esophagus. 2.8 cm solid and sub solid lesion in the lateral aspect of the right upper lobe. This could be pneumonia or, more concerning, a neoplastic lesion. Complete chest CT suggested when able. This may require further workup. Electronically Signed   By: Nelson Chimes M.D.   On: 12/27/2020 18:27    PHYSICAL EXAM  Temp:  [98.1 F (36.7 C)-99 F (37.2 C)] 98.1 F (36.7 C) (05/05 0420) Pulse Rate:  [70-82] 70 (05/05 0738) Resp:  [16-20] 18 (05/05 0738) BP: (139-217)/(103-129) 139/129 (05/05 0738) SpO2:  [100 %] 100 % (05/05 0738)  General - Well nourished, well developed, sobbing.  Ophthalmologic - fundi not visualized due to noncooperation.  Cardiovascular - Regular rhythm and rate.  Neuro - awake, alert, eyes open, not answering orientation questions but able to have first pronounciation out and then stutters. Able to follow most simple commands. Not able to name and repeat, again had first pronunciation and then stutters. No gaze palsy, tracking bilaterally, blinking to visual threat bilaterally, PERRL. Left facial droop. Tongue midline. LUE contracture with 2/5 proximal and 2/5 finger grip. LLE barely against gravity, 2+/5 proximal and distally. RUE at least 3/5 and RLE 2+/5. Sensation and coordination not cooperative, gait not tested. symptoms much improved after MRI.    ASSESSMENT/PLAN Ms. Lucyle Alumbaugh is a 50 y.o. female with history of PE, hypertension, PVD, diabetes, HLD, obesity, migraine, COPD, CKD, history of stroke and ICH admitted for worsening slurred speech, and difficulty walking. No tPA given due to outside window and history  of ICH.    Recrudescence of old strokes in the setting of anxiety and depression vs. Conversion disorder  Episode of difficulty  speaking, stuttering, whole body weaker than baseline, crying  MRI no acute change, still has evolving left CR small infarct, chronic right BG ICH  MRA unchanged  Continue current management  Supportive care and emotional support  May consider antidepressant if warranted.   Stroke:  left CR infarct, likely secondary to small vessel disease source likely due to  Uncontrolled risk factors including HTN, HLD  CT head no acute abnormality  CT head and neck no LVO  MRI left CR acute infarct  2D Echo EF 60 to 65%  LDL 136  HgbA1c 6.6  UDS positive for THC  Lovenox for VTE prophylaxis  aspirin 81 mg daily prior to admission, now on aspirin 81 mg daily. Continue on discharge. No DAPT given hx of ICH and current uncontrolled HTN  Patient counseled to be compliant with her antithrombotic medications  Ongoing aggressive stroke risk factor management  Therapy recommendations:  CIR, need outpt referral to PMR for botox injection  Disposition:  pending  Diabetes  HgbA1c 6.6 goal < 7.0  Controlled  CBG monitoring  SSI  DM education and close PCP follow up  Hypertension  Unstable with high BP  Gradually normalized within 3-5 days.  Long term BP goal normotensive  Hyperlipidemia  Home meds:  lipitor 40   LDL 136, goal < 70  Now increased to Lipitor 80  Continue statin at discharge  Other Stroke Risk Factors  Obesity, Body mass index is 32.07 kg/m.   PVD  History of PE was on Xarelto, Xarelto discontinued after ICH  Substance abuse, UDS THC positive, cessation education provided  Other Active Problems  COPD  Hospital day # 3  Neurology will sign off. Please call with questions. Pt will follow up with stroke clinic NP at [facility] in about 4 weeks. Thanks for the consult.   Rosalin Hawking, MD PhD Stroke Neurology 12/30/2020 8:03 AM  Patient condition worsened within the last 24 hours, has developed speech difficulty and whole body weakness and  triggered code stroke activation, and I ordered MRI and MRA which showed no new changes. I spent  35 minutes in total face-to-face time with the patient, more than 50% of which was spent in counseling and coordination of care, reviewing test results, images and medication, and discussing the diagnosis, treatment plan and potential prognosis. This patient's care requiresreview of multiple databases, neurological assessment, discussion with family, other specialists and medical decision making of high complexity.       To contact Stroke Continuity provider, please refer to [email or web address] After hours, contact General Neurology

## 2020-12-30 NOTE — Progress Notes (Signed)
Pt was at baseline around 0657 when pt reminded me to get her a warm blanket.  On shift change, around 0727 symptoms involving slowed responses, expressive aphasia, dysarthria, 4-point extremity weakness, and a tearful expression were discovered by the day shift RN I gave report to and myself.   Handoff was given to [facility], RN and patient was taken to STAT imaging.

## 2020-12-30 NOTE — Progress Notes (Addendum)
Patient took PO morning meds, looked like she was struggling to swallow. Patient stated she felt like she needed to throw up. Patient vomited clear liquid and medications. MD notified. Will hold all PO meds until speaking with MD and esophagram results, due to concern for aspiration and choking. Will consider changing medications to crushed in puree

## 2020-12-30 NOTE — Care Management Important Message (Signed)
Important Message  Patient Details  Name: Diamond Rice MRN: [id number] Date of Birth: Dec 13, 1970   Medicare Important Message Given:  Yes     Orbie Pyo 12/30/2020, 2:46 PM

## 2020-12-30 NOTE — H&P (View-Only) (Signed)
Referring Provider: [facility] Primary Care Physician:  [name], MD Primary Gastroenterologist:  Dr. Paulita Fujita [facility])  Reason for Consultation: Dysphagia, abnormal barium swallow  HPI: Diamond Rice is a 50 y.o. female with history of insulin-dependent type 2 diabetes, CKD, history of substance abuse,HTN, history of CVA on 11/14/2018 with stable left hemiparesis, history of PE (not on anticoagulation), H pylori gastritis, and history of lower gastrointestinal hemorrhage 05/02/2018 presenting for consultation of dysphagia and abnormal barium swallow.  Patient reports dysphagia occurring for the past 1-2 years, which started shortly after her stroke.  She states that sometimes, when she tries to eat, she feels food become stuck in her esophagus/chest.  She denies associated nausea/vomiting but states she does have regurgitation of food at times.  States her appetite is OK but she does not want to eat as much due to dysphagia.  She believes she has lost some weight over the last year.  Denies changes in stool, melena, or hematochezia. Denies abdominal pain.    Denies NSAID, ASA, or blood thinner use as an outpatient but has been started on ASA 81 mg and prophylactic Lovenox as an inpatient.  She underwent EGD 10/27/2020 which revealed normal esophagus, gastritis (bx: chronic gastritis with H pylori), and she was prescribed Pylera.  Patient had previous hospitalization August 2019 for hematochezia with anemia, requiring transfusion and hospitlization. She had a colonoscopy performed by Dr. Burt Knack on 04/04/2018 with fair prep. 50m polyp was found the proximal ascending colon, 10 millimeter polyp in the descending colon semi-pedunculated, and 3-4 10-12 millimeter ulcers in the distal rectum was clean base, no bleeding was present. Pathology showed tubular adenoma without high-grade dysplasia. Inflamed and ulcerated colorectal type mucosa without malignancy likely stercoral ulcer. She had a repeat  colonoscopy 10/27/2020 which revealed diverticulosis and one 532mpolyp (tubular adenoma).  Past Medical History:  Diagnosis Date  . Anxiety   . Chronic lower back pain 04/17/2012   "just got over some; catched when I walked"  . COPD [place]ch[place] obstructive pulmonary disease) ([place]  . Critical lower limb ischemia ([place]09/30/2019  . Depression   . Headache(784.0) 04/17/2012   "~ qod; lately waking up in am w/one"  . High cholesterol   . Hypertension   . Migraines 04/17/2012  . Obesity   . Sleep apnea    CPAP  . Stroke ([place]  . Type II diabetes mellitus ([place]/08/2002    Past Surgical History:  Procedure Laterality Date  . ABDOMINAL AORTOGRAM W/LOWER EXTREMITY Left 05/27/2018   Procedure: ABDOMINAL AORTOGRAM W/LOWER EXTREMITY Runoff and Possible Intervention;  Surgeon: CaWaynetta SandyMD;  [facility];  Service: Cardiovascular;  Laterality: Left;  . APPLICATION OF WOUND VAC Left 06/06/2018   Procedure: APPLICATION OF WOUND VAC;  Surgeon: CaWaynetta SandyMD;  [facility] [facility]: Vascular;  Laterality: Left;  . BIOPSY  04/04/2018   Procedure: BIOPSY;  Surgeon: GuJackquline DenmarkMD;  [facility];  Service: Endoscopy;;  . BIOPSY  10/27/2020   Procedure: BIOPSY;  Surgeon: OuArta SilenceMD;  [facility];  Service: Endoscopy;;  . CARDIAC CATHETERIZATION  ~ 2011  . COLONOSCOPY N/A 04/04/2018   Procedure: COLONOSCOPY;  Surgeon: GuJackquline DenmarkMD;  [facility];  Service: Endoscopy;  Laterality: N/A;  . COLONOSCOPY WITH PROPOFOL N/A 10/27/2020   Procedure: COLONOSCOPY WITH PROPOFOL;  Surgeon: OuArta SilenceMD;  [facility];  Service: Endoscopy;  Laterality: N/A;  . ESOPHAGOGASTRODUODENOSCOPY (EGD) WITH PROPOFOL  N/A 10/27/2020   Procedure: ESOPHAGOGASTRODUODENOSCOPY (EGD) WITH PROPOFOL  WITH POSSIBLE DIL;  Surgeon: Willis Modena, MD;  [facility];  Service: Endoscopy;  Laterality: N/A;  . LOWER EXTREMITY  ANGIOGRAM Left 05/27/2018  . PERIPHERAL VASCULAR INTERVENTION Left 05/27/2018   Procedure: PERIPHERAL VASCULAR INTERVENTION;  Surgeon: Maeola Harman, MD;  [facility] INVASIVE CV LAB;  Service: Cardiovascular;  Laterality: Left;  . POLYPECTOMY  04/04/2018   Procedure: POLYPECTOMY;  Surgeon: Lynann Bologna, MD;  Location: [facility] ENDOSCOPY;  Service: Endoscopy;;  . POLYPECTOMY  10/27/2020   Procedure: POLYPECTOMY;  Surgeon: Willis Modena, MD;  [facility];  Service: Endoscopy;;  . TRANSMETATARSAL AMPUTATION Left 05/28/2018   Procedure: AMPUTATION TOES THREE, FOUR AND FIVE on left foot;  Surgeon: Maeola Harman, MD;  [facility] OR;  Service: Vascular;  Laterality: Left;  . WOUND DEBRIDEMENT Left 06/06/2018   Procedure: DEBRIDEMENT WOUND LEFT FOOT;  Surgeon: Maeola Harman, MD;  Location: [facility] OR;  Service: Vascular;  Laterality: Left;    Prior to Admission medications   Medication Sig Start Date End Date Taking? Authorizing Provider  amLODipine (NORVASC) 10 MG tablet Take 1 tablet (10 mg total) by mouth daily. 01/22/20  Yes Wert, Trula Ore, NP  ARIPiprazole (ABILIFY) 5 MG tablet Take 5 mg by mouth daily. 08/28/20  Yes [provider]  cloNIDine (CATAPRES) 0.3 MG tablet Take 1 tablet (0.3 mg total) by mouth 2 (two) times daily. 01/22/20  Yes Fletcher Anon, NP  hydrALAZINE (APRESOLINE) 100 MG tablet Take 1 tablet (100 mg total) by mouth 3 (three) times daily. 01/22/20  Yes Wert, Trula Ore, NP  insulin detemir (LEVEMIR) 100 UNIT/ML FlexPen Inject 15 Units into the skin 2 (two) times daily. 01/22/20  Yes Wert, Trula Ore, NP  lisinopril (ZESTRIL) 40 MG tablet Take 1 tablet (40 mg total) by mouth daily. 01/22/20  Yes Fletcher Anon, NP  metoprolol succinate (TOPROL-XL) 50 MG 24 hr tablet Take 50 mg by mouth every morning. 08/02/20  Yes [provider]  mirtazapine (REMERON) 15 MG tablet Take 1 tablet (15 mg total) by mouth at bedtime. For Depression  01/22/20  Yes Fletcher Anon, NP  ACCU-CHEK GUIDE test strip 2 (two) times daily. as directed 02/06/20   [provider]  Accu-Chek Softclix Lancets lancets 2 (two) times daily. 02/06/20   [provider]  aspirin EC 81 MG EC tablet Take 1 tablet (81 mg total) by mouth daily. Patient not taking: No sig reported 09/21/18   Layne Benton, NP  atorvastatin (LIPITOR) 40 MG tablet Take 1 tablet (40 mg total) by mouth daily at 6 PM. Patient not taking: No sig reported 01/22/20   Fletcher Anon, NP  Blood Glucose Monitoring Suppl (ACCU-CHEK GUIDE) w/Device KIT USE AS DIRECTED TO CHECK BLOOD SUGAR 02/06/20   [provider]    Scheduled Meds: . amLODipine  10 mg Oral Daily  . amoxicillin-clavulanate  1 tablet Oral Q12H  . ARIPiprazole  5 mg Oral Daily  . aspirin  81 mg Oral Daily  . atorvastatin  80 mg Oral q1800  . chlorhexidine  15 mL Mouth Rinse BID  . enoxaparin (LOVENOX) injection  40 mg Subcutaneous Q24H  . hydrALAZINE  100 mg Oral Q8H  . insulin aspart  0-9 Units Subcutaneous TID WC  . lisinopril  40 mg Oral Daily  . mouth rinse  15 mL Mouth Rinse q12n4p  . metoprolol succinate  50 mg Oral q morning  . mirtazapine  15 mg Oral QHS  Continuous Infusions: . potassium chloride     PRN Meds:.acetaminophen **OR** acetaminophen (TYLENOL) oral liquid 160 mg/5 mL **OR** acetaminophen, labetalol  Allergies as of 12/27/2020 - Review Complete 12/27/2020  Allergen Reaction Noted  . Morphine and related Hives 10/23/2011  . Metformin Diarrhea and Other (See Comments) 05/16/2018    Family History  Problem Relation Age of Onset  . Stroke Brother 64  . Stroke Brother 89  . Heart attack Mother 68  . Stroke Father 4    Social History   Socioeconomic History  . Marital status: Single    Spouse name: Not on file  . Number of children: Not on file  . Years of education: Not on file  . Highest education level: Not on file  Occupational History  . Occupation:  disabled  Tobacco Use  . Smoking status: Former Smoker    Packs/day: 0.25    Years: 28.00    Pack years: 7.00    Types: Cigarettes  . Smokeless tobacco: Never Used  Vaping Use  . Vaping Use: Never used  Substance and Sexual Activity  . Alcohol use: No    Alcohol/week: 0.0 standard drinks    Comment: rare  . Drug use: Yes    Types: Marijuana, "Crack" cocaine    Comment: marijuana last use days ago; last cocaine several months ago  . Sexual activity: Not Currently    Birth control/protection: None  Other Topics Concern  . Not on file  Social History Narrative   Was a  Quarry manager at [employer]. Lives with godmother.     Social Determinants of Health   Financial Resource Strain: Not on file  Food Insecurity: Not on file  Transportation Needs: Not on file  Physical Activity: Not on file  Stress: Not on file  Social Connections: Not on file  Intimate Partner Violence: Not on file    Review of Systems: Review of Systems  Constitutional: Positive for weight loss. Negative for chills and fever.  HENT: Negative for hearing loss and tinnitus.   Eyes: Negative for pain and redness.  Respiratory: Negative for cough and shortness of breath.   Cardiovascular: Negative for chest pain and palpitations.  Gastrointestinal: Negative for abdominal pain, blood in stool, constipation, diarrhea, heartburn, melena, nausea and vomiting.       +dysphagia  Genitourinary: Negative for flank pain and hematuria.  Musculoskeletal: Negative for falls and joint pain.  Skin: Negative for itching and rash.  Neurological: Negative for seizures and loss of consciousness.  Endo/Heme/Allergies: Negative for polydipsia. Does not bruise/bleed easily.  Psychiatric/Behavioral: Negative for substance abuse. The patient is not nervous/anxious.      Physical Exam: Vital signs: Vitals:   12/30/20 0420 12/30/20 0738  BP: (!) 196/115 (!) 139/129  Pulse: 73 70  Resp: 19 18  Temp: 98.1 F (36.7 C)   SpO2: 100% 100%    Last BM Date: 12/28/20  Physical Exam Vitals reviewed.  Constitutional:      General: She is not in acute distress. HENT:     Head: Normocephalic and atraumatic.     Nose: Nose normal. No congestion.     Mouth/Throat:     Mouth: Mucous membranes are moist.     Pharynx: Oropharynx is clear.  Eyes:     Extraocular Movements: Extraocular movements intact.     Conjunctiva/sclera: Conjunctivae normal.  Cardiovascular:     Rate and Rhythm: Normal rate and regular rhythm.  Pulmonary:     Effort: Pulmonary effort is normal. No respiratory distress.  Abdominal:     General: Bowel sounds are normal. There is no distension.     Palpations: Abdomen is soft. There is no mass.     Tenderness: There is no abdominal tenderness. There is no guarding or rebound.     Hernia: No hernia is present.  Musculoskeletal:        General: No swelling or tenderness.     Cervical back: Normal range of motion and neck supple.  Lymphadenopathy:     Cervical: Cervical adenopathy: .  Skin:    General: Skin is warm and dry.  Neurological:     Mental Status: She is alert and oriented to person, place, and time. Mental status is at baseline.  Psychiatric:        Mood and Affect: Mood normal.        Behavior: Behavior normal.      GI:  Lab Results: Recent Labs    12/27/20 1749 12/27/20 1755 12/29/20 0230  WBC 6.9  --  6.5  HGB 12.5 13.9 12.3  HCT 39.8 41.0 39.0  PLT 264  --  257   BMET Recent Labs    12/27/20 1749 12/27/20 1755 12/29/20 0230 12/30/20 0406  NA 138 140 138 137  K 3.5 3.5 3.2* 3.1*  CL 104 103 104 103  CO2 25  --  28 24  GLUCOSE 143* 143* 126* 135*  BUN _0 CREATININE 1.04* 0.90 1.02* 1.00  CALCIUM 9.4  --  9.2 9.1   LFT Recent Labs    12/27/20 1749  PROT 8.1  ALBUMIN 3.5  AST 11*  ALT 8  ALKPHOS 66  BILITOT 0.5   PT/INR Recent Labs    12/27/20 1749  LABPROT 13.6  INR 1.0     Studies/Results: CT CHEST W CONTRAST  Result Date:  12/29/2020 CLINICAL DATA:  Lung nodule seen on CT angiography of the head and neck. EXAM: CT CHEST WITH CONTRAST TECHNIQUE: Multidetector CT imaging of the chest was performed during intravenous contrast administration. CONTRAST:  52m OMNIPAQUE IOHEXOL 300 MG/ML  SOLN COMPARISON:  CT a head neck 12/27/2020, chest radiograph 12/27/2020, CT a chest 06/22/2018 FINDINGS: Cardiovascular: Mild cardiomegaly. No pericardial effusion. Scattered coronary artery calcifications. Pulmonary arteries are normal caliber. No large central filling defects are identified on this non tailored examination of the pulmonary arteries. Atherosclerotic plaque within the normal caliber aorta. No acute luminal abnormality of the imaged aorta. No periaortic stranding or hemorrhage. Shared origin of the brachiocephalic and left common carotid arteries. Minimal calcifications in the proximal great vessels. No major venous abnormalities. Suspect laminar flow mixing artifact in the internal jugular veins. No significant venous reflux. Mediastinum/Nodes: Redemonstration of a patulous, distended thoracic esophagus which contains layering air-fluid levels and ingested material to the level of the thoracic inlet. No acute tracheal abnormality. Thyroid gland and thoracic inlet is unremarkable. No worrisome mediastinal, hilar or axillary adenopathy. Lungs/Pleura: Corresponding well to the opacity seen on the CT angiography of the head neck are several patchy ground-glass and tree-in-bud opacities present in the right upper lobe in a somewhat branching configuration, favored to reflect some acute infectious or inflammatory process including sequela of aspiration. No other concerning pulmonary nodules or masses. No other consolidative process. No pneumothorax or visible effusion. No convincing CT evidence of edema. Some bandlike opacity in the lung bases, likely atelectasis or scarring. Upper Abdomen: High attenuation enteric contrast media layers  dependently within the stomach. No acute abnormalities present in the visualized portions of  the upper abdomen. Scattered colonic diverticula without focal inflammation to suggest diverticulitis. Musculoskeletal: Multilevel degenerative changes are present in the imaged portions of the spine. Multilevel flowing anterior osteophytosis, compatible with features of diffuse idiopathic skeletal hyperostosis (DISH). Additional degenerative changes in the bilateral shoulders. Features of right calcific tendinosis/hydroxyapatite deposition. No acute or worrisome osseous abnormalities. Mild dextrocurvature of the spine. No worrisome chest wall mass or lesion. IMPRESSION: There are scattered mixed ground-glass and tree-in-bud nodular opacities predominantly which appear confined to the right upper lobe. Appearance favors an infectious or inflammatory process including possible sequela of aspiration given a chronically patulous and distended thoracic esophagus containing admixed air and ingested material to the level of the thoracic inlet, which is a similar appearance to comparison CT from 2019. Recommend follow-up noncontrast CT imaging to ensure resolution after therapeutic trial. No other acute intrathoracic process. No other concerning pulmonary nodules or masses. Cardiomegaly.  Coronary artery atherosclerosis. Aortic Atherosclerosis (ICD10-I70.0). Colonic diverticulosis. Multilevel degenerative changes in the spine including features of diffuse idiopathic skeletal hyperostosis. Suspect some hydroxyapatite deposition/calcific tendinosis of the right shoulder. Electronically Signed   By: Lovena Le M.D.   On: 12/29/2020 03:12   MR ANGIO HEAD WO CONTRAST  Result Date: 12/30/2020 CLINICAL DATA:  Follow-up left hemisphere white matter stroke. EXAM: MRI HEAD WITHOUT CONTRAST MRA HEAD WITHOUT CONTRAST TECHNIQUE: Multiplanar, multiecho pulse sequences of the brain and surrounding structures were obtained without intravenous  contrast. Angiographic images of the head were obtained using MRA technique without contrast. COMPARISON:  12/27/2020 FINDINGS: MRI HEAD FINDINGS Brain: Diffusion imaging does not show any progressive change related to 4-5 mm acute infarction of the left centrum semiovale region. Elsewhere, chronic small-vessel ischemic changes are evident throughout the hemispheric white matter. Old small vessel infarctions of the thalami. Old hemorrhagic infarction of the right basal ganglia. No change in those findings since the study of 3 days ago. No hydrocephalus or extra-axial collection. Vascular: Major vessels at the base of the brain show flow. Skull and upper cervical spine: Negative Sinuses/Orbits: Clear/normal Other: None MRA HEAD FINDINGS Both internal carotid arteries are patent through the skull base and siphon region. Some siphon atherosclerotic irregularity. The anterior and middle cerebral vessels are patent. No large or medium vessel occlusion. Moderate distal vessel atherosclerotic irregularity is noted. Motion artifact degrades evaluation of the vertebral arteries. Basilar artery is patent. 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 Posterior circulation branch vessels are patent. Patent bilateral posterior communicating arteries. There is atherosclerotic narrowing and irregularity of the PCA branches with proximal stenosis ease. IMPRESSION: No acute large vessel occlusion. Atherosclerotic irregularity of the distal intracranial branch vessels. 2 mm basilar tip aneurysm. No change in the 4-5 mm acute infarction of the left posterior centrum semiovale white matter. Extensive chronic ischemic changes elsewhere throughout the brain as previously seen. Electronically Signed   By: Nelson Chimes M.D.   On: 12/30/2020 08:57   MR BRAIN WO CONTRAST  Result Date: 12/30/2020 CLINICAL DATA:  Follow-up left hemisphere white matter stroke. EXAM: MRI HEAD WITHOUT CONTRAST MRA HEAD WITHOUT CONTRAST TECHNIQUE: Multiplanar,  multiecho pulse sequences of the brain and surrounding structures were obtained without intravenous contrast. Angiographic images of the head were obtained using MRA technique without contrast. COMPARISON:  12/27/2020 FINDINGS: MRI HEAD FINDINGS Brain: Diffusion imaging does not show any progressive change related to 4-5 mm acute infarction of the left centrum semiovale region. Elsewhere, chronic small-vessel ischemic changes are evident throughout the hemispheric white matter. Old small vessel infarctions of the  thalami. Old hemorrhagic infarction of the right basal ganglia. No change in those findings since the study of 3 days ago. No hydrocephalus or extra-axial collection. Vascular: Major vessels at the base of the brain show flow. Skull and upper cervical spine: Negative Sinuses/Orbits: Clear/normal Other: None MRA HEAD FINDINGS Both internal carotid arteries are patent through the skull base and siphon region. Some siphon atherosclerotic irregularity. The anterior and middle cerebral vessels are patent. No large or medium vessel occlusion. Moderate distal vessel atherosclerotic irregularity is noted. Motion artifact degrades evaluation of the vertebral arteries. Basilar artery is patent. 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 Posterior circulation branch vessels are patent. Patent bilateral posterior communicating arteries. There is atherosclerotic narrowing and irregularity of the PCA branches with proximal stenosis ease. IMPRESSION: No acute large vessel occlusion. Atherosclerotic irregularity of the distal intracranial branch vessels. 2 mm basilar tip aneurysm. No change in the 4-5 mm acute infarction of the left posterior centrum semiovale white matter. Extensive chronic ischemic changes elsewhere throughout the brain as previously seen. Electronically Signed   By: Nelson Chimes M.D.   On: 12/30/2020 08:57   DG Swallowing Func-Speech Pathology  Result Date: 12/28/2020 Objective Swallowing Evaluation: Type  of Study: MBS-Modified Barium Swallow Study  Patient Details Name: Diamond Rice MRN: [id number] Date of Birth: 1971-01-30 Today's Date: 12/28/2020 Time: SLP Start Time (ACUTE ONLY): 2876 -SLP Stop Time (ACUTE ONLY): 1400 SLP Time Calculation (min) (ACUTE ONLY): 14 min Past Medical History: Past Medical History: Diagnosis Date . Anxiety  . Chronic lower back pain 04/17/2012  "just got over some; catched when I walked" . COPD (chronic obstructive pulmonary disease) ([place])  . Critical lower limb ischemia ([place]) 05/27/2018 . Depression  . Headache(784.0) 04/17/2012  "~ qod; lately waking up in am w/one" . High cholesterol  . Hypertension  . Migraines 04/17/2012 . Obesity  . Sleep apnea   CPAP . Stroke ([place])  . Type II diabetes mellitus ([place]) 08/28/2002 Past Surgical History: Past Surgical History: Procedure Laterality Date . ABDOMINAL AORTOGRAM W/LOWER EXTREMITY Left 05/27/2018  Procedure: ABDOMINAL AORTOGRAM W/LOWER EXTREMITY Runoff and Possible Intervention;  Surgeon: Waynetta Sandy, MD;  Location: [place] CV LAB;  Service: Cardiovascular;  Laterality: Left; . APPLICATION OF WOUND VAC Left 81/15/7262  Procedure: APPLICATION OF WOUND VAC;  Surgeon: Waynetta Sandy, MD;  Location: [place];  Service: Vascular;  Laterality: Left; . BIOPSY  04/04/2018  Procedure: BIOPSY;  Surgeon: [name], MD;  Location: [facility] ENDOSCOPY;  Service: Endoscopy;; . BIOPSY  10/27/2020  Procedure: BIOPSY;  Surgeon: Arta Silence, MD;  [facility]OSCOPY;  Service: Endoscopy;; . CARDIAC CATHETERIZATION  ~ 2011 . COLONOSCOPY N/A 04/04/2018  Procedure: COLONOSCOPY;  Surgeon: [name], MD;  Location: [facility] ENDOSCOPY;  Service: Endoscopy;  Laterality: N/A; . COLONOSCOPY WITH PROPOFOL N/A 10/27/2020  Procedure: COLONOSCOPY WITH PROPOFOL;  Surgeon: Arta Silence, MD;  [facility] ENDOSCOPY;  Service: Endoscopy;  Laterality: N/A; . ESOPHAGOGASTRODUODENOSCOPY (EGD) WITH PROPOFOL N/A 10/27/2020  Procedure: ESOPHAGOGASTRODUODENOSCOPY  (EGD) WITH PROPOFOL  WITH POSSIBLE DIL;  Surgeon: Arta Silence, MD;  [facility] ENDOSCOPY;  Service: Endoscopy;  Laterality: N/A; . LOWER EXTREMITY ANGIOGRAM Left 05/27/2018 . PERIPHERAL VASCULAR INTERVENTION Left 05/27/2018  Procedure: PERIPHERAL VASCULAR INTERVENTION;  Surgeon: Waynetta Sandy, MD;  Location: [place] CV LAB;  Service: Cardiovascular;  Laterality: Left; . POLYPECTOMY  04/04/2018  Procedure: POLYPECTOMY;  Surgeon: [name], MD;  Location: [facility] ENDOSCOPY;  Service: Endoscopy;; . POLYPECTOMY  10/27/2020  Procedure: POLYPECTOMY;  Surgeon: Arta Silence,  MD;  [facility] ENDOSCOPY;  Service: Endoscopy;; . TRANSMETATARSAL AMPUTATION Left 05/28/2018  Procedure: AMPUTATION TOES THREE, FOUR AND FIVE on left foot;  Surgeon: Waynetta Sandy, MD;  Location: [place];  Service: Vascular;  Laterality: Left; . WOUND DEBRIDEMENT Left 06/06/2018  Procedure: DEBRIDEMENT WOUND LEFT FOOT;  Surgeon: Waynetta Sandy, MD;  Location: [facility] OR;  Service: Vascular;  Laterality: Left; HPI: Pt is a 50 yo female with a h/o 4 prior strokes presents wtih an acute worsening of BLE pain and dysarthria. MRI showed an acute infarction of the L hemispheric white matter in the upper parietal centrum semiovale region. PMH also includes: DM2, HTN, HLD, smoking, prior cocaine abuse, PE, medication nonadhering. Most recent MBS 12/08/19 revealed aspiration of thin liquids (PAS 7, 8) with recs for Dys 2/Nectar. She reports advancing to regular solids/thin liquids at SNF after FEES. Pt also had dysarthria at that time impacting word to short phrase level.  Subjective: eager to eat and drink Assessment / Plan / Recommendation CHL IP CLINICAL IMPRESSIONS 12/28/2020 Clinical Impression Pt presents with a mild oral dysphagia with pharyngeal phase relatively WFL. She has intermittent oral holding with liquids, with mild lingual residue also noted primarily on her first few sips. She also has loss of regular solids to  the pyriform sinuses while she is still masticating, but there is no aspiration across all consistencies. Recommend starting a diet consisting of regular solids and thin liquids. Given coughing noted during initial evaluation, will f/u briefly. SLP Visit Diagnosis Dysphagia, oral phase (R13.11) Attention and concentration deficit following -- Frontal lobe and executive function deficit following -- Impact on safety and function Mild aspiration risk   CHL IP TREATMENT RECOMMENDATION 12/28/2020 Treatment Recommendations Therapy as outlined in treatment plan below   Prognosis 12/28/2020 Prognosis for Safe Diet [employer] Good Barriers to Reach Goals -- Barriers/Prognosis Comment -- [facility] IP DIET RECOMMENDATION 12/28/2020 [facility] Diet Recommendations Regular solids;Thin liquid Liquid Administration via Cup;Straw Medication Administration Whole meds with liquid Compensations Slow rate;Small sips/bites Postural Changes Seated upright at 90 degrees;Remain semi-upright after after feeds/meals (Comment)   [facility] IP OTHER RECOMMENDATIONS 12/28/2020 Recommended Consults -- Oral Care Recommendations Oral care BID Other Recommendations --   [facility] IP FOLLOW UP RECOMMENDATIONS 12/28/2020 Follow up Recommendations Inpatient Rehab   [facility] IP FREQUENCY AND DURATION 12/28/2020 Speech Therapy Frequency (ACUTE ONLY) min 2x/week Treatment Duration 2 weeks      [facility] IP ORAL PHASE 12/28/2020 Oral Phase Impaired Oral - Pudding Teaspoon -- Oral - Pudding Cup -- Oral - Honey Teaspoon -- Oral - Honey Cup -- Oral - Nectar Teaspoon -- Oral - Nectar Cup -- Oral - Nectar Straw -- Oral - Thin Teaspoon -- Oral - Thin Cup Lingual/palatal residue;Holding of bolus Oral - Thin Straw Holding of bolus Oral - Puree WFL Oral - Mech Soft -- Oral - Regular Premature spillage;Impaired mastication Oral - Multi-Consistency -- Oral - Pill WFL Oral Phase - Comment --  [facility] IP PHARYNGEAL PHASE 12/28/2020 Pharyngeal Phase WFL Pharyngeal- Pudding Teaspoon -- Pharyngeal -- Pharyngeal- Pudding Cup  -- Pharyngeal -- Pharyngeal- Honey Teaspoon -- Pharyngeal -- Pharyngeal- Honey Cup -- Pharyngeal -- Pharyngeal- Nectar Teaspoon -- Pharyngeal -- Pharyngeal- Nectar Cup -- Pharyngeal -- Pharyngeal- Nectar Straw -- Pharyngeal -- Pharyngeal- Thin Teaspoon -- Pharyngeal -- Pharyngeal- Thin Cup -- Pharyngeal -- Pharyngeal- Thin Straw -- Pharyngeal -- Pharyngeal- Puree -- Pharyngeal -- Pharyngeal- Mechanical Soft -- Pharyngeal -- Pharyngeal- Regular -- Pharyngeal -- Pharyngeal- Multi-consistency -- Pharyngeal -- Pharyngeal- Pill -- Pharyngeal --  Pharyngeal Comment --  [facility] IP CERVICAL ESOPHAGEAL PHASE 12/28/2020 Cervical Esophageal Phase WFL Pudding Teaspoon -- Pudding Cup -- Honey Teaspoon -- Honey Cup -- Nectar Teaspoon -- Nectar Cup -- Nectar Straw -- Thin Teaspoon -- Thin Cup -- Thin Straw -- Puree -- Mechanical Soft -- Regular -- Multi-consistency -- Pill -- Cervical Esophageal Comment -- Osie Bond., M.A. CCC-SLP Acute Rehabilitation Services Pager [phone] Office [phone] 12/28/2020, 3:02 PM              DG ESOPHAGUS W SINGLE CM (SOL OR THIN BA)  Result Date: 12/30/2020 CLINICAL DATA:  Soft ill dysphagia. Feels like food gets stuck in her throat and has trouble burping since her stroke last year EXAM: ESOPHOGRAM/BARIUM SWALLOW TECHNIQUE: Single contrast examination was performed using  thin barium. FLUOROSCOPY TIME:  Fluoroscopy Time:  3 minutes 12 seconds Radiation Exposure Index (if provided by the fluoroscopic device): 49.2 mGy Number of Acquired Spot Images: 7 COMPARISON:  Chest CT 12/29/2020 FINDINGS: Thoracic esophagram was performed utilizing thin barium only in the LPO position due to patient condition. There is severe esophageal dysmotility with impaired relaxation of the lower esophageal sphincter not coordinated with esophageal contractions. There is stasis of barium in the esophagus and esophageal dilation. There is distal segment narrowing of approximately 3.5 cm. IMPRESSION: Distal esophageal  narrowing with segment measuring approximately 3.0 cm, with upstream esophageal dilation, and esophageal dysmotility with contractions not in coordination with the lower esophageal sphincter. Findings are compatible with a diagnosis of achalasia. Recommend gastroenterology referral/consultation. Electronically Signed   By: Maurine Simmering   On: 12/30/2020 11:40    Impression: Dysphagia, barium swallow 5/5 suggestive of achalasia: Distal esophageal narrowing with segment measuring approximately 3.0 cm, with upstream esophageal dilation, and esophageal dysmotility with contractions not in coordination with the lower esophageal sphincter. Findings are compatible with a diagnosis of achalasia.  History of H pylori gastritis -Normal Hgb (12.3) as of 5/4  CVA  Aspiration PNA  DM type 2, HTN  Plan: EGD tomorrow with possible balloon dilation and Botox injection.  I thoroughly discussed the procedure with the patient to include nature, alternatives, benefits, and risks including but not limited to bleeding, infection, perforation, anesthesia/cardiopulmonary complications).  Patient verbalized understanding and gave verbal consent to proceed with EGD, balloon dilation, and Botox injection.  At time of procedures, repeat biopsies for H. pylori can be completed.  NPO at midnight.  [facility] will follow.    LOS: 3 days   Salley Slaughter  PA-C 12/30/2020, 1:04 PM  Contact #  [phone]

## 2020-12-30 NOTE — Progress Notes (Addendum)
PROGRESS NOTE    Diamond Rice  [id number] DOB: [birth year] DOA: 12/27/2020 PCP: [name], MD   Brief Narrative:  HPI on 12/27/2020 by Dr. Jennette Kettle Diamond Rice is a 50 y.o. female with medical history significant of DM2, HTN, HLD, smoking, prior cocaine abuse, PE (no longer on anticoagulation), medication nonadhering.  Pt with h/o 4 prior strokes: 3 ischemic, 1 hemorrhagic.  Pt has chronic L sided deficits from the hemorrhagic stroke.  Most recent stroke was ischemic in April of 2021.  Today pt presents to ED with acute worsening of B leg pain and dysarthria.  LKW was 10am today.  Symptoms are constant, moderate, persistent.  Nothing makes symptoms better or worse.  EMS called, pt noted to be very hypertensive.  No fevers, chills.  Interim history Admitted for acute CVA.  Patient with slurred speech and left-sided weakness.  Completed stroke work-up.  Since homeless, deemed not appropriate for CIR, awaiting SNF.  Also noted history of PAD, COPD, CKD stage II.  Course complicated by recurrent strokelike symptoms on morning of 5/5, stroke code activated, neurology followed up, repeat MRI without acute findings.   Assessment & Plan   Acute ischemic stroke -Presented with dysarthria and inability to walk -Patient's had a history of prior strokes -Per neurology left CR infarct, likely secondary to small vessel disease source likely due to uncontrolled risk factors including hypertension and hyperlipidemia. -CT head showed no acute large vessel infarction.  Chronic small vessel ischemic changes throughout the brain somewhat progressive since April 2021 -CTA head and neck showed no large or medium vessel occlusion. -MRI showed 4 to 5 mm acute infarction of the left hemispheric white matter in the upper parietal centrum semiovale region -2D echo: LVEF 60-65%. -LDL 136, hemoglobin A1c 6.6 -UDS positive for THC. -Continue statin. -PT, OT and ST recommended CIR.   Rehab MD consulted and input appreciated, felt appropriate for inpatient rehab but patient reportedly homeless which makes disposition difficult.  If cannot go to CIR then may need SNF.  Insurance authorization pending.  Also per rehab MD recommendation, needs outpatient referral to PMR for Botox of left upper extremity to help with spasticity. -As per neurology, patient on aspirin 81 mg daily prior to admission, now on aspirin 81 mg daily.  Continue same regimen at discharge.  No DAPT given history of ICH and currently uncontrolled hypertension. -On morning of 5/5, patient had recurrence of strokelike symptoms.  Stroke code was activated.  Neurology followed up.  Repeat MRI without acute change, still has evolving left CR small infarct, chronic right basal ganglia MCH.  Discussed with Dr. Erlinda Hong who feels that this was recrudescence of old strokes in the setting of anxiety and depression versus conversion disorder.  Recommends continuing current management and consider antidepressant if needed.  Clinically improved.  Abnormal finding on lung imaging/suspected chronic aspiration pneumonia -CT head and neck showed 2.8 cm solid and some solid lesion in the lateral aspect of the right upper lobe -CT chest with contrast 5/4: Scattered mixed groundglass and tree-in-bud nodular opacity predominantly which appear confined to right upper lobe.  Appearance favors an infectious or inflammatory process including possible sequelae of aspiration given a chronically patulous and distended thoracic esophagus containing admixed air and ingested material to the level of the thoracic inlet which is a similar appearance compared to CT from 2019.  Recommend follow-up noncontrast CT imaging to ensure resolution after therapeutic trial.  No other acute intrathoracic process or concerning pulmonary nodules or  masses. -Patient does report history of what appears to be aspiration/choking episodes when attempting to burp.  We will get  speech therapy to evaluate swallow.  Started oral Augmentin x1 week.  Consider repeating CT chest in 4 weeks.  I reviewed the case with PCCM team on 5/4 who agreed with this management.  Dysphagia Suspected due to stroke. Had EGD 10/27/2020 by [facility] which showed normal esophagus. Speech therapy consulted for swallow evaluation and dietary recommendations.  Discussed with the indicated that she had MBS on 5/3 that showed no aspiration.  They recommended barium swallow Barium swallow abnormal, suggestive of achalasia.  Discussed with/consulted Dr. Therisa Doyne, [facility], will formally consult, patient will likely need EGD with Botox injection.  Diabetes mellitus, type II -hemoglobin A1c 6.6 -Change CBG to before meals and at bedtime and SSI to 3 times daily with meals.  Reasonable inpatient control.  Levemir currently on hold  Essential hypertension -Allowing for permissive hypertension given acute CVA and gradually normalize in 3 to 5 days -Home medications currently on hold -Markedly hypertensive on 5/4 with BP as high as 221/102 earlier this morning followed by 217/106.  Patient on polypharmacy antihypertensives at home suggestive of difficult to control hypertension including: Amlodipine 10 Mg daily, clonidine 0.3 mg twice daily, hydralazine 100 mg 3 times daily, lisinopril 40 Mg daily, Toprol-XL 50 mg daily. -As clarified with stroke MD 5/4, permissive hypertension for 24 to 48 hours poststroke followed by gradual normalization of BP in 3 to 5 days.  Resumed Toprol-XL and amlodipine at this time and monitor. -Blood pressures continue to be markedly elevated, 196/115 at 4 AM, do not feel the blood pressure of 139/129 is truly accurate.  Resumed prior home dose of hydralazine and lisinopril.  Monitor closely.  Polysubstance abuse -Tox screen was positive for THC.  Cessation counseled.  Hyperlipidemia -Lipid panel shows total cholesterol 209, HDL 60, LDL is 136, triglycerides 63 -Continue statin.   Patient was noncompliant with this.  Compliance counseled.  Hypokalemia: Replace and follow.  Magnesium 2.1.  Patient vomited oral potassium this morning, will replace IV.  DVT Prophylaxis SCDs  Code Status: Full  Family Communication: None at bedside  Disposition Plan:  Status is: Inpatient  Remains inpatient appropriate because:Ongoing diagnostic testing needed not appropriate for outpatient work up and Inpatient level of care appropriate due to severity of illness   Dispo: The patient is from: Home              Anticipated d/c is to: SNF              Patient currently is not medically stable to d/c.  Due to markedly elevated blood pressures and issue this morning with new onset strokelike symptoms.   Difficult to place patient No  Consultants Neurology  Procedures  None  Antibiotics   Anti-infectives (From admission, onward)   Start     Dose/Rate Route Frequency Ordered Stop   12/29/20 1230  amoxicillin-clavulanate (AUGMENTIN) 875-125 MG per tablet 1 tablet        1 tablet Oral Every 12 hours 12/29/20 1131 01/05/21 0959      Subjective:   Vickie Epley seen and examined today.  As I walked into patient's room this morning, both patient's night nurse as well as the nurse reported new onset change in patient's clinical status including word finding difficulty, global weakness.  Patient only able to mouth words and able to say her name.  No facial asymmetry noted.  Did have more  global limb weakness than seen yesterday.  Stroke code activated.  Objective:   Vitals:   12/29/20 2000 12/30/20 0000 12/30/20 0420 12/30/20 0738  BP: (!) 184/127 (!) 190/107 (!) 196/115 (!) 139/129  Pulse: 82 74 73 70  Resp: 18 20 19 18   Temp: 99 F (37.2 C) 98.4 F (36.9 C) 98.1 F (36.7 C)   TempSrc: Oral Oral Oral   SpO2: 100% 100% 100% 100%  Weight:      Height:       No intake or output data in the 24 hours ending 12/30/20 0914 Filed Weights   12/28/20 0839  Weight: 77 kg     General exam: Young female, moderately built and obese lying comfortably propped up in bed without distress.  Looks worse than she did yesterday. Respiratory system: Clear to auscultation.  No increased work of breathing. Cardiovascular system: S1 and S2 heard, RRR.  No JVD, murmurs or pedal edema.  Telemetry personally reviewed: Sinus rhythm with first-degree AV block. Gastrointestinal system: Abdomen is nondistended, soft and nontender. No organomegaly or masses felt. Normal bowel sounds heard. Central nervous system: Appears alert and oriented.  Only able to mouth words/suspected expressive aphasia.  Appears to understand what is being asked of her.  Chronic diminished left nasolabial fold prominence. Extremities: Left extremities grade 2 x 5 power.  Left upper extremity with contracture.  Right upper extremity grade 3 x 5.  Right lower extremity grade 2 x 5 at least.   Skin: No rashes, lesions or ulcers Psychiatry: Judgement and insight appear normal. Mood & affect anxious and somewhat tearful on the way to CT..      Data Reviewed: I have personally reviewed following labs and imaging studies  CBC: Recent Labs  Lab 12/27/20 1749 12/27/20 1755 12/29/20 0230  WBC 6.9  --  6.5  NEUTROABS 3.2  --   --   HGB 12.5 13.9 12.3  HCT 39.8 41.0 39.0  MCV 73.8*  --  73.0*  PLT 264  --  308   Basic Metabolic Panel: Recent Labs  Lab 12/27/20 1749 12/27/20 1755 12/29/20 0230 12/30/20 0406  NA 138 140 138 137  K 3.5 3.5 3.2* 3.1*  CL 104 103 104 103  CO2 25  --  28 24  GLUCOSE 143* 143* 126* 135*  BUN 11 12 11 11   CREATININE 1.04* 0.90 1.02* 1.00  CALCIUM 9.4  --  9.2 9.1  MG  --   --   --  2.1   GFR: Estimated Creatinine Clearance: 64.6 mL/min (by C-G formula based on SCr of 1 mg/dL). Liver Function Tests: Recent Labs  Lab 12/27/20 1749  AST 11*  ALT 8  ALKPHOS 66  BILITOT 0.5  PROT 8.1  ALBUMIN 3.5   Coagulation Profile: Recent Labs  Lab 12/27/20 1749  INR 1.0    HbA1C: Recent Labs    12/28/20 0349  HGBA1C 6.6*   CBG: Recent Labs  Lab 12/29/20 0732 12/29/20 1121 12/29/20 1523 12/30/20 0623 12/30/20 0726  GLUCAP 105* 186* 157* 144* 167*   Lipid Profile: Recent Labs    12/28/20 0349  CHOL 209*  HDL 60  LDLCALC 136*  TRIG 63  CHOLHDL 3.5   Urine analysis:    Component Value Date/Time   COLORURINE STRAW (A) 12/27/2020 1930   APPEARANCEUR CLEAR 12/27/2020 1930   LABSPEC 1.023 12/27/2020 1930   PHURINE 8.0 12/27/2020 1930   GLUCOSEU 50 (A) 12/27/2020 [place] NEGATIVE 12/27/2020 [place]  NEGATIVE 12/27/2020 1930   KETONESUR NEGATIVE 12/27/2020 1930   PROTEINUR 30 (A) 12/27/2020 1930   UROBILINOGEN 0.2 08/28/2014 1143   NITRITE NEGATIVE 12/27/2020 1930   LEUKOCYTESUR NEGATIVE 12/27/2020 1930    Recent Results (from the past 240 hour(s))  Resp Panel by RT-PCR (Flu A&B, Covid) Nasopharyngeal Swab     Status: None   Collection Time: 12/27/20  5:49 PM   Specimen: Nasopharyngeal Swab; Nasopharyngeal(NP) swabs in vial transport medium  Result Value Ref Range Status   SARS Coronavirus 2 by RT PCR NEGATIVE NEGATIVE Final    Comment: (NOTE) SARS-CoV-2 target nucleic acids are NOT DETECTED.  The SARS-CoV-2 RNA is generally detectable in upper respiratory specimens during the acute phase of infection. The lowest concentration of SARS-CoV-2 viral copies this assay can detect is 138 copies/mL. A negative result does not preclude SARS-Cov-2 infection and should not be used as the sole basis for treatment or other patient management decisions. A negative result may occur with  improper specimen collection/handling, submission of specimen other than nasopharyngeal swab, presence of viral mutation(s) within the areas targeted by this assay, and inadequate number of viral copies(<138 copies/mL). A negative result must be combined with clinical observations, patient history, and epidemiological information. The expected  result is Negative.  Fact Sheet for Patients:  EntrepreneurPulse.com.au  Fact Sheet for Healthcare Providers:  IncredibleEmployment.be  This test is no t yet approved or cleared by the [place] FDA and  has been authorized for detection and/or diagnosis of SARS-CoV-2 by FDA under an Emergency Use Authorization (EUA). This EUA will remain  in effect (meaning this test can be used) for the duration of the COVID-19 declaration under Section 564(b)(1) of the Act, 21 U.S.C.section 360bbb-3(b)(1), unless the authorization is terminated  or revoked sooner.       Influenza A by PCR NEGATIVE NEGATIVE Final   Influenza B by PCR NEGATIVE NEGATIVE Final    Comment: (NOTE) The Xpert Xpress SARS-CoV-2/FLU/RSV plus assay is intended as an aid in the diagnosis of influenza from Nasopharyngeal swab specimens and should not be used as a sole basis for treatment. Nasal washings and aspirates are unacceptable for Xpert Xpress SARS-CoV-2/FLU/RSV testing.  Fact Sheet for Patients: EntrepreneurPulse.com.au  Fact Sheet for Healthcare Providers: IncredibleEmployment.be  This test is not yet approved or cleared by the [place] FDA and has been authorized for detection and/or diagnosis of SARS-CoV-2 by FDA under an Emergency Use Authorization (EUA). This EUA will remain in effect (meaning this test can be used) for the duration of the COVID-19 declaration under Section 564(b)(1) of the Act, 21 U.S.C. section 360bbb-3(b)(1), unless the authorization is terminated or revoked.  Performed at [facility], [place] [address], [place], [place] [address]       Radiology Studies: CT CHEST W CONTRAST  Result Date: 12/29/2020 CLINICAL DATA:  Lung nodule seen on CT angiography of the head and neck. EXAM: CT CHEST WITH CONTRAST TECHNIQUE: Multidetector CT imaging of the chest was performed during intravenous contrast  administration. CONTRAST:  72mL OMNIPAQUE IOHEXOL 300 MG/ML  SOLN COMPARISON:  CT a head neck 12/27/2020, chest radiograph 12/27/2020, CT a chest 06/22/2018 FINDINGS: Cardiovascular: Mild cardiomegaly. No pericardial effusion. Scattered coronary artery calcifications. Pulmonary arteries are normal caliber. No large central filling defects are identified on this non tailored examination of the pulmonary arteries. Atherosclerotic plaque within the normal caliber aorta. No acute luminal abnormality of the imaged aorta. No periaortic stranding or hemorrhage. Shared origin of the brachiocephalic and left  common carotid arteries. Minimal calcifications in the proximal great vessels. No major venous abnormalities. Suspect laminar flow mixing artifact in the internal jugular veins. No significant venous reflux. Mediastinum/Nodes: Redemonstration of a patulous, distended thoracic esophagus which contains layering air-fluid levels and ingested material to the level of the thoracic inlet. No acute tracheal abnormality. Thyroid gland and thoracic inlet is unremarkable. No worrisome mediastinal, hilar or axillary adenopathy. Lungs/Pleura: Corresponding well to the opacity seen on the CT angiography of the head neck are several patchy ground-glass and tree-in-bud opacities present in the right upper lobe in a somewhat branching configuration, favored to reflect some acute infectious or inflammatory process including sequela of aspiration. No other concerning pulmonary nodules or masses. No other consolidative process. No pneumothorax or visible effusion. No convincing CT evidence of edema. Some bandlike opacity in the lung bases, likely atelectasis or scarring. Upper Abdomen: High attenuation enteric contrast media layers dependently within the stomach. No acute abnormalities present in the visualized portions of the upper abdomen. Scattered colonic diverticula without focal inflammation to suggest diverticulitis.  Musculoskeletal: Multilevel degenerative changes are present in the imaged portions of the spine. Multilevel flowing anterior osteophytosis, compatible with features of diffuse idiopathic skeletal hyperostosis (DISH). Additional degenerative changes in the bilateral shoulders. Features of right calcific tendinosis/hydroxyapatite deposition. No acute or worrisome osseous abnormalities. Mild dextrocurvature of the spine. No worrisome chest wall mass or lesion. IMPRESSION: There are scattered mixed ground-glass and tree-in-bud nodular opacities predominantly which appear confined to the right upper lobe. Appearance favors an infectious or inflammatory process including possible sequela of aspiration given a chronically patulous and distended thoracic esophagus containing admixed air and ingested material to the level of the thoracic inlet, which is a similar appearance to comparison CT from 2019. Recommend follow-up noncontrast CT imaging to ensure resolution after therapeutic trial. No other acute intrathoracic process. No other concerning pulmonary nodules or masses. Cardiomegaly.  Coronary artery atherosclerosis. Aortic Atherosclerosis (ICD10-I70.0). Colonic diverticulosis. Multilevel degenerative changes in the spine including features of diffuse idiopathic skeletal hyperostosis. Suspect some hydroxyapatite deposition/calcific tendinosis of the right shoulder. Electronically Signed   By: Kreg ShropshirePrice  DeHay M.D.   On: 12/29/2020 03:12   DG Swallowing Func-Speech Pathology  Result Date: 12/28/2020 Objective Swallowing Evaluation: Type of Study: MBS-Modified Barium Swallow Study  Patient Details Name: Diamond Rice MRN: [id number] Date of Birth: 04/25/1971 Today's Date: 12/28/2020 Time: SLP Start Time (ACUTE ONLY): 1346 -SLP Stop Time (ACUTE ONLY): 1400 SLP Time Calculation (min) (ACUTE ONLY): 14 min Past Medical History: Past Medical History: Diagnosis Date . Anxiety  . Chronic lower back pain 04/17/2012  "just got over  some; catched when I walked" . COPD (chronic obstructive pulmonary disease) (HCC)  . Critical lower limb ischemia (HCC) 05/27/2018 . Depression  . Headache(784.0) 04/17/2012  "~ qod; lately waking up in am w/one" . High cholesterol  . Hypertension  . Migraines 04/17/2012 . Obesity  . Sleep apnea   CPAP . Stroke (HCC)  . Type II diabetes mellitus (HCC) 08/28/2002 Past Surgical History: Past Surgical History: Procedure Laterality Date . ABDOMINAL AORTOGRAM W/LOWER EXTREMITY Left 05/27/2018  Procedure: ABDOMINAL AORTOGRAM W/LOWER EXTREMITY Runoff and Possible Intervention;  Surgeon: Maeola Harmanain, Brandon Christopher, MD;  [facility];  Service: Cardiovascular;  Laterality: Left; . APPLICATION OF WOUND VAC Left 06/06/2018  Procedure: APPLICATION OF WOUND VAC;  Surgeon: Maeola Harmanain, Brandon Christopher, MD;  [name];  Service: Vascular;  Laterality: Left; . BIOPSY  04/04/2018  Procedure: BIOPSY;  Surgeon: Lynann BolognaGupta, Rajesh, MD;  Location: [place] ENDOSCOPY;  Service: Endoscopy;; . BIOPSY  10/27/2020  Procedure: BIOPSY;  Surgeon: Arta Silence, MD;  Location: [facility] ENDOSCOPY;  Service: Endoscopy;; . CARDIAC CATHETERIZATION  ~ 2011 . COLONOSCOPY N/A 04/04/2018  Procedure: COLONOSCOPY;  Surgeon: [name], MD;  Location: [facility] ENDOSCOPY;  Service: Endoscopy;  Laterality: N/A; . COLONOSCOPY WITH PROPOFOL N/A 10/27/2020  Procedure: COLONOSCOPY WITH PROPOFOL;  Surgeon: Arta Silence, MD;  Location: [facility] ENDOSCOPY;  Service: Endoscopy;  Laterality: N/A; . ESOPHAGOGASTRODUODENOSCOPY (EGD) WITH PROPOFOL N/A 10/27/2020  Procedure: ESOPHAGOGASTRODUODENOSCOPY (EGD) WITH PROPOFOL  WITH POSSIBLE DIL;  Surgeon: Arta Silence, MD;  Location: [facility] ENDOSCOPY;  Service: Endoscopy;  Laterality: N/A; . LOWER EXTREMITY ANGIOGRAM Left 05/27/2018 . PERIPHERAL VASCULAR INTERVENTION Left 05/27/2018  Procedure: PERIPHERAL VASCULAR INTERVENTION;  Surgeon: Waynetta Sandy, MD;  Location: [facility] CV LAB;  Service: Cardiovascular;  Laterality: Left; .  POLYPECTOMY  04/04/2018  Procedure: POLYPECTOMY;  Surgeon: [name], MD;  Location: [facility] ENDOSCOPY;  Service: Endoscopy;; . POLYPECTOMY  10/27/2020  Procedure: POLYPECTOMY;  Surgeon: Arta Silence, MD;  Location: [facility] ENDOSCOPY;  Service: Endoscopy;; . TRANSMETATARSAL AMPUTATION Left 05/28/2018  Procedure: AMPUTATION TOES THREE, FOUR AND FIVE on left foot;  Surgeon: Waynetta Sandy, MD;  Location: [facility];  Service: Vascular;  Laterality: Left; . WOUND DEBRIDEMENT Left 06/06/2018  Procedure: DEBRIDEMENT WOUND LEFT FOOT;  Surgeon: Waynetta Sandy, MD;  Location[facility] OR;  Service: Vascular;  Laterality: Left; HPI: Pt is a 50 yo female with a h/o 4 prior strokes presents wtih an acute worsening of BLE pain and dysarthria. MRI showed an acute infarction of the L hemispheric white matter in the upper parietal centrum semiovale region. PMH also includes: DM2, HTN, HLD, smoking, prior cocaine abuse, PE, medication nonadhering. Most recent MBS 12/08/19 revealed aspiration of thin liquids (PAS 7, 8) with recs for Dys 2/Nectar. She reports advancing to regular solids/thin liquids at SNF after FEES. Pt also had dysarthria at that time impacting word to short phrase level.  Subjective: eager to eat and drink Assessment / Plan / Recommendation CHL IP CLINICAL IMPRESSIONS 12/28/2020 Clinical Impression Pt presents with a mild oral dysphagia with pharyngeal phase relatively WFL. She has intermittent oral holding with liquids, with mild lingual residue also noted primarily on her first few sips. She also has loss of regular solids to the pyriform sinuses while she is still masticating, but there is no aspiration across all consistencies. Recommend starting a diet consisting of regular solids and thin liquids. Given coughing noted during initial evaluation, will f/u briefly. SLP Visit Diagnosis Dysphagia, oral phase (R13.11) Attention and concentration deficit following -- Frontal lobe and executive function deficit  following -- Impact on safety and function Mild aspiration risk   CHL IP TREATMENT RECOMMENDATION 12/28/2020 Treatment Recommendations Therapy as outlined in treatment plan below   Prognosis 12/28/2020 Prognosis for Safe Diet Advancement Good Barriers to Reach Goals -- Barriers/Prognosis Comment -- CHL IP DIET RECOMMENDATION 12/28/2020 SLP Diet Recommendations Regular solids;Thin liquid Liquid Administration via Cup;Straw Medication Administration Whole meds with liquid Compensations Slow rate;Small sips/bites Postural Changes Seated upright at 90 degrees;Remain semi-upright after after feeds/meals (Comment)   CHL IP OTHER RECOMMENDATIONS 12/28/2020 Recommended Consults -- Oral Care Recommendations Oral care BID Other Recommendations --   CHL IP FOLLOW UP RECOMMENDATIONS 12/28/2020 Follow up Recommendations Inpatient Rehab   CHL IP FREQUENCY AND DURATION 12/28/2020 Speech Therapy Frequency (ACUTE ONLY) min 2x/week Treatment Duration 2 weeks      CHL IP ORAL PHASE 12/28/2020 Oral Phase Impaired Oral -  Pudding Teaspoon -- Oral - Pudding Cup -- Oral - Honey Teaspoon -- Oral - Honey Cup -- Oral - Nectar Teaspoon -- Oral - Nectar Cup -- Oral - Nectar Straw -- Oral - Thin Teaspoon -- Oral - Thin Cup Lingual/palatal residue;Holding of bolus Oral - Thin Straw Holding of bolus Oral - Puree WFL Oral - Mech Soft -- Oral - Regular Premature spillage;Impaired mastication Oral - Multi-Consistency -- Oral - Pill WFL Oral Phase - Comment --  CHL IP PHARYNGEAL PHASE 12/28/2020 Pharyngeal Phase WFL Pharyngeal- Pudding Teaspoon -- Pharyngeal -- Pharyngeal- Pudding Cup -- Pharyngeal -- Pharyngeal- Honey Teaspoon -- Pharyngeal -- Pharyngeal- Honey Cup -- Pharyngeal -- Pharyngeal- Nectar Teaspoon -- Pharyngeal -- Pharyngeal- Nectar Cup -- Pharyngeal -- Pharyngeal- Nectar Straw -- Pharyngeal -- Pharyngeal- Thin Teaspoon -- Pharyngeal -- Pharyngeal- Thin Cup -- Pharyngeal -- Pharyngeal- Thin Straw -- Pharyngeal -- Pharyngeal- Puree -- Pharyngeal --  Pharyngeal- Mechanical Soft -- Pharyngeal -- Pharyngeal- Regular -- Pharyngeal -- Pharyngeal- Multi-consistency -- Pharyngeal -- Pharyngeal- Pill -- Pharyngeal -- Pharyngeal Comment --  CHL IP CERVICAL ESOPHAGEAL PHASE 12/28/2020 Cervical Esophageal Phase WFL Pudding Teaspoon -- Pudding Cup -- Honey Teaspoon -- Honey Cup -- Nectar Teaspoon -- Nectar Cup -- Nectar Straw -- Thin Teaspoon -- Thin Cup -- Thin Straw -- Puree -- Mechanical Soft -- Regular -- Multi-consistency -- Pill -- Cervical Esophageal Comment -- Osie Bond., M.A. [place] [facility] Pager [phone] Office [phone] 12/28/2020, 3:02 PM              ECHOCARDIOGRAM COMPLETE  Result Date: 12/28/2020    ECHOCARDIOGRAM REPORT   Patient Name:   SHALAH ESTELLE Date of Exam: 12/28/2020 Medical Rec #:  [id number]         Height:       61.0 in Accession #:    [id number]        Weight:       169.8 lb Date of Birth:  Sep 01, 1970          BSA:          1.762 m Patient Age:    3 years          BP:           199/124 mmHg Patient Gender: F                 HR:           73 bpm. Exam Location:  Inpatient Procedure: 2D Echo Indications:    stroke  History:        Patient has prior history of Echocardiogram examinations, most                 recent 12/07/2019. Risk Factors:Hypertension, Dyslipidemia and                 Diabetes.  Sonographer:    Johny Chess Referring Phys: [place]  1. Left ventricular ejection fraction, by estimation, is 60 to 65%. The left ventricle has normal function. The left ventricle has no regional wall motion abnormalities. There is moderate left ventricular hypertrophy. Left ventricular diastolic parameters are consistent with Grade I diastolic dysfunction (impaired relaxation).  2. Right ventricular systolic function is normal. The right ventricular size is normal.  3. Left atrial size was mildly dilated.  4. The mitral valve is normal in structure. No evidence of mitral valve regurgitation. No  evidence of mitral stenosis.  5. Left coronary cusp calcification. The aortic valve is tricuspid.  There is mild calcification of the aortic valve. There is mild thickening of the aortic valve. Aortic valve regurgitation is moderate to severe. No aortic stenosis is present.  6. The inferior vena cava is normal in size with greater than 50% respiratory variability, suggesting right atrial pressure of 3 mmHg. Conclusion(s)/Recommendation(s): No intracardiac source of embolism detected on this transthoracic study. A transesophageal echocardiogram is recommended to exclude cardiac source of embolism if clinically indicated. FINDINGS  Left Ventricle: Left ventricular ejection fraction, by estimation, is 60 to 65%. The left ventricle has normal function. The left ventricle has no regional wall motion abnormalities. The left ventricular internal cavity size was normal in size. There is  moderate left ventricular hypertrophy. Left ventricular diastolic parameters are consistent with Grade I diastolic dysfunction (impaired relaxation). Right Ventricle: The right ventricular size is normal. No increase in right ventricular wall thickness. Right ventricular systolic function is normal. Left Atrium: Left atrial size was mildly dilated. Right Atrium: Right atrial size was normal in size. Pericardium: There is no evidence of pericardial effusion. Mitral Valve: The mitral valve is normal in structure. No evidence of mitral valve regurgitation. No evidence of mitral valve stenosis. Tricuspid Valve: The tricuspid valve is normal in structure. Tricuspid valve regurgitation is not demonstrated. No evidence of tricuspid stenosis. Aortic Valve: Left coronary cusp calcification. The aortic valve is tricuspid. There is mild calcification of the aortic valve. There is mild thickening of the aortic valve. Aortic valve regurgitation is moderate to severe. Aortic regurgitation PHT measures 489 msec. No aortic stenosis is present. Pulmonic  Valve: The pulmonic valve was normal in structure. Pulmonic valve regurgitation is not visualized. No evidence of pulmonic stenosis. Aorta: The aortic root is normal in size and structure. Venous: The inferior vena cava is normal in size with greater than 50% respiratory variability, suggesting right atrial pressure of 3 mmHg. IAS/Shunts: No atrial level shunt detected by color flow Doppler.  LEFT VENTRICLE PLAX 2D LVIDd:         3.90 cm LVIDs:         3.00 cm LV PW:         1.60 cm LV IVS:        1.30 cm LVOT diam:     2.00 cm LV SV:         75 LV SV Index:   43 LVOT Area:     3.14 cm  LV Volumes (MOD) LV vol d, MOD A2C: 81.6 ml LV vol s, MOD A2C: 34.5 ml LV SV MOD A2C:     47.1 ml RIGHT VENTRICLE             IVC RV S prime:     14.90 cm/s  IVC diam: 1.30 cm TAPSE (M-mode): 2.3 cm LEFT ATRIUM             Index       RIGHT ATRIUM           Index LA diam:        3.00 cm 1.70 cm/m  RA Area:     14.10 cm LA Vol (A2C):   61.3 ml 34.80 ml/m RA Volume:   30.60 ml  17.37 ml/m LA Vol (A4C):   56.4 ml 32.02 ml/m LA Biplane Vol: 59.9 ml 34.00 ml/m  AORTIC VALVE LVOT Vmax:   124.00 cm/s LVOT Vmean:  77.600 cm/s LVOT VTI:    0.239 m AI PHT:      489 msec  AORTA Ao Root diam: 2.60  cm Ao Asc diam:  3.40 cm MV E velocity: 66.70 cm/s MV A velocity: 132.00 cm/s  SHUNTS MV E/A ratio:  0.51         Systemic VTI:  0.24 m                             Systemic Diam: 2.00 cm Candee Furbish MD Electronically signed by Candee Furbish MD Signature Date/Time: 12/28/2020/11:31:32 AM    Final      Scheduled Meds: . amLODipine  10 mg Oral Daily  . amoxicillin-clavulanate  1 tablet Oral Q12H  . ARIPiprazole  5 mg Oral Daily  . aspirin  81 mg Oral Daily  . atorvastatin  80 mg Oral q1800  . chlorhexidine  15 mL Mouth Rinse BID  . enoxaparin (LOVENOX) injection  40 mg Subcutaneous Q24H  . hydrALAZINE  100 mg Oral Q8H  . insulin aspart  0-9 Units Subcutaneous TID WC  . lisinopril  40 mg Oral Daily  . mouth rinse  15 mL Mouth Rinse q12n4p   . metoprolol succinate  50 mg Oral q morning  . mirtazapine  15 mg Oral QHS  . potassium chloride  40 mEq Oral Q4H   Continuous Infusions:   LOS: 3 days   Time Spent in minutes   45 minutes  Vernell Leep, MD, [place], [facility]. [facility]  To contact the attending provider between 7A-7P or the covering provider during after hours 7P-7A, please log into the web site [email or web address] and access using universal [place] password for that web site. If you do not have the password, please call the hospital operator.

## 2020-12-30 NOTE — Progress Notes (Addendum)
Code stroke was activated. Patient LKW at [phone] when night shift RN took her a blanket. RN went in at [phone] noted her speech had worsened and she was having diffuse weakness.NIHSS 16. Patient was taken down to stat MRI. Patient was disoriented, left facial droop, bilateral arm weakness, bilateral leg weakness, left decreased sensation, Expressive aphasia  and dysarthria. No new findings on MRI. Dr. Erlinda Hong ordered q2 NIH for 12 hours. Will continue to monitor closely.

## 2020-12-30 NOTE — Consult Note (Addendum)
Referring Provider: [facility] Primary Care Physician:  [name], MD Primary Gastroenterologist:  Dr. Paulita Fujita [facility])  Reason for Consultation: Dysphagia, abnormal barium swallow  HPI: Diamond Rice is a 50 y.o. female with history of insulin-dependent type 2 diabetes, CKD, history of substance abuse,HTN, history of CVA on 11/14/2018 with stable left hemiparesis, history of PE (not on anticoagulation), H pylori gastritis, and history of lower gastrointestinal hemorrhage 05/02/2018 presenting for consultation of dysphagia and abnormal barium swallow.  Patient reports dysphagia occurring for the past 1-2 years, which started shortly after her stroke.  She states that sometimes, when she tries to eat, she feels food become stuck in her esophagus/chest.  She denies associated nausea/vomiting but states she does have regurgitation of food at times.  States her appetite is OK but she does not want to eat as much due to dysphagia.  She believes she has lost some weight over the last year.  Denies changes in stool, melena, or hematochezia. Denies abdominal pain.    Denies NSAID, ASA, or blood thinner use as an outpatient but has been started on ASA 81 mg and prophylactic Lovenox as an inpatient.  She underwent EGD 10/27/2020 which revealed normal esophagus, gastritis (bx: chronic gastritis with H pylori), and she was prescribed Pylera.  Patient had previous hospitalization August 2019 for hematochezia with anemia, requiring transfusion and hospitlization. She had a colonoscopy performed by Dr. Burt Knack on 04/04/2018 with fair prep. 50m polyp was found the proximal ascending colon, 10 millimeter polyp in the descending colon semi-pedunculated, and 3-4 10-12 millimeter ulcers in the distal rectum was clean base, no bleeding was present. Pathology showed tubular adenoma without high-grade dysplasia. Inflamed and ulcerated colorectal type mucosa without malignancy likely stercoral ulcer. She had a repeat  colonoscopy 10/27/2020 which revealed diverticulosis and one 532mpolyp (tubular adenoma).  Past Medical History:  Diagnosis Date  . Anxiety   . Chronic lower back pain 04/17/2012   "just got over some; catched when I walked"  . COPD [place]ch[place] obstructive pulmonary disease) ([place]  . Critical lower limb ischemia ([place]09/30/2019  . Depression   . Headache(784.0) 04/17/2012   "~ qod; lately waking up in am w/one"  . High cholesterol   . Hypertension   . Migraines 04/17/2012  . Obesity   . Sleep apnea    CPAP  . Stroke ([place]  . Type II diabetes mellitus ([place]/08/2002    Past Surgical History:  Procedure Laterality Date  . ABDOMINAL AORTOGRAM W/LOWER EXTREMITY Left 05/27/2018   Procedure: ABDOMINAL AORTOGRAM W/LOWER EXTREMITY Runoff and Possible Intervention;  Surgeon: CaWaynetta SandyMD;  [facility];  Service: Cardiovascular;  Laterality: Left;  . APPLICATION OF WOUND VAC Left 06/06/2018   Procedure: APPLICATION OF WOUND VAC;  Surgeon: CaWaynetta SandyMD;  [facility] [facility]: [facility];  Laterality: Left;  . BIOPSY  04/04/2018   Procedure: BIOPSY;  Surgeon: GuJackquline DenmarkMD;  [facility];  Service: Endoscopy;;  . BIOPSY  10/27/2020   Procedure: BIOPSY;  Surgeon: OuArta SilenceMD;  [facility];  Service: Endoscopy;;  . CARDIAC CATHETERIZATION  ~ 2011  . COLONOSCOPY N/A 04/04/2018   Procedure: COLONOSCOPY;  Surgeon: GuJackquline DenmarkMD;  [facility];  Service: Endoscopy;  Laterality: N/A;  . COLONOSCOPY WITH PROPOFOL N/A 10/27/2020   Procedure: COLONOSCOPY WITH PROPOFOL;  Surgeon: OuArta SilenceMD;  [facility];  Service: Endoscopy;  Laterality: N/A;  . ESOPHAGOGASTRODUODENOSCOPY (EGD) WITH PROPOFOL  N/A 10/27/2020   Procedure: ESOPHAGOGASTRODUODENOSCOPY (EGD) WITH PROPOFOL  WITH POSSIBLE DIL;  Surgeon: Willis Modena, MD;  [facility];  Service: Endoscopy;  Laterality: N/A;  . LOWER EXTREMITY  ANGIOGRAM Left 05/27/2018  . PERIPHERAL VASCULAR INTERVENTION Left 05/27/2018   Procedure: PERIPHERAL VASCULAR INTERVENTION;  Surgeon: Maeola Harman, MD;  [facility] INVASIVE CV LAB;  Service: Cardiovascular;  Laterality: Left;  . POLYPECTOMY  04/04/2018   Procedure: POLYPECTOMY;  Surgeon: Lynann Bologna, MD;  Location: [facility] ENDOSCOPY;  Service: Endoscopy;;  . POLYPECTOMY  10/27/2020   Procedure: POLYPECTOMY;  Surgeon: Willis Modena, MD;  [facility];  Service: Endoscopy;;  . TRANSMETATARSAL AMPUTATION Left 05/28/2018   Procedure: AMPUTATION TOES THREE, FOUR AND FIVE on left foot;  Surgeon: Maeola Harman, MD;  Location[facility] OR;  Service: Vascular;  Laterality: Left;  . WOUND DEBRIDEMENT Left 06/06/2018   Procedure: DEBRIDEMENT WOUND LEFT FOOT;  Surgeon: Maeola Harman, MD;  Location: [facility] OR;  Service: Vascular;  Laterality: Left;    Prior to Admission medications   Medication Sig Start Date End Date Taking? Authorizing Provider  amLODipine (NORVASC) 10 MG tablet Take 1 tablet (10 mg total) by mouth daily. 01/22/20  Yes Wert, Trula Ore, NP  ARIPiprazole (ABILIFY) 5 MG tablet Take 5 mg by mouth daily. 08/28/20  Yes [provider]  cloNIDine (CATAPRES) 0.3 MG tablet Take 1 tablet (0.3 mg total) by mouth 2 (two) times daily. 01/22/20  Yes Fletcher Anon, NP  hydrALAZINE (APRESOLINE) 100 MG tablet Take 1 tablet (100 mg total) by mouth 3 (three) times daily. 01/22/20  Yes Wert, Trula Ore, NP  insulin detemir (LEVEMIR) 100 UNIT/ML FlexPen Inject 15 Units into the skin 2 (two) times daily. 01/22/20  Yes Wert, Trula Ore, NP  lisinopril (ZESTRIL) 40 MG tablet Take 1 tablet (40 mg total) by mouth daily. 01/22/20  Yes Fletcher Anon, NP  metoprolol succinate (TOPROL-XL) 50 MG 24 hr tablet Take 50 mg by mouth every morning. 08/02/20  Yes [provider]  mirtazapine (REMERON) 15 MG tablet Take 1 tablet (15 mg total) by mouth at bedtime. For Depression  01/22/20  Yes Fletcher Anon, NP  ACCU-CHEK GUIDE test strip 2 (two) times daily. as directed 02/06/20   [provider]  Accu-Chek Softclix Lancets lancets 2 (two) times daily. 02/06/20   [provider]  aspirin EC 81 MG EC tablet Take 1 tablet (81 mg total) by mouth daily. Patient not taking: No sig reported 09/21/18   Layne Benton, NP  atorvastatin (LIPITOR) 40 MG tablet Take 1 tablet (40 mg total) by mouth daily at 6 PM. Patient not taking: No sig reported 01/22/20   Fletcher Anon, NP  Blood Glucose Monitoring Suppl (ACCU-CHEK GUIDE) w/Device KIT USE AS DIRECTED TO CHECK BLOOD SUGAR 02/06/20   [provider]    Scheduled Meds: . amLODipine  10 mg Oral Daily  . amoxicillin-clavulanate  1 tablet Oral Q12H  . ARIPiprazole  5 mg Oral Daily  . aspirin  81 mg Oral Daily  . atorvastatin  80 mg Oral q1800  . chlorhexidine  15 mL Mouth Rinse BID  . enoxaparin (LOVENOX) injection  40 mg Subcutaneous Q24H  . hydrALAZINE  100 mg Oral Q8H  . insulin aspart  0-9 Units Subcutaneous TID WC  . lisinopril  40 mg Oral Daily  . mouth rinse  15 mL Mouth Rinse q12n4p  . metoprolol succinate  50 mg Oral q morning  . mirtazapine  15 mg Oral QHS  Continuous Infusions: . potassium chloride     PRN Meds:.acetaminophen **OR** acetaminophen (TYLENOL) oral liquid 160 mg/5 mL **OR** acetaminophen, labetalol  Allergies as of 12/27/2020 - Review Complete 12/27/2020  Allergen Reaction Noted  . Morphine and related Hives 10/23/2011  . Metformin Diarrhea and Other (See Comments) 05/16/2018    Family History  Problem Relation Age of Onset  . Stroke Brother 66  . Stroke Brother 27  . Heart attack Mother 88  . Stroke Father 52    Social History   Socioeconomic History  . Marital status: Single    Spouse name: Not on file  . Number of children: Not on file  . Years of education: Not on file  . Highest education level: Not on file  Occupational History  . Occupation:  disabled  Tobacco Use  . Smoking status: Former Smoker    Packs/day: 0.25    Years: 28.00    Pack years: 7.00    Types: Cigarettes  . Smokeless tobacco: Never Used  Vaping Use  . Vaping Use: Never used  Substance and Sexual Activity  . Alcohol use: No    Alcohol/week: 0.0 standard drinks    Comment: rare  . Drug use: Yes    Types: Marijuana, "Crack" cocaine    Comment: marijuana last use days ago; last cocaine several months ago  . Sexual activity: Not Currently    Birth control/protection: None  Other Topics Concern  . Not on file  Social History Narrative   Was a  Quarry manager at [employer]. Lives with godmother.     Social Determinants of Health   Financial Resource Strain: Not on file  Food Insecurity: Not on file  Transportation Needs: Not on file  Physical Activity: Not on file  Stress: Not on file  Social Connections: Not on file  Intimate Partner Violence: Not on file    Review of Systems: Review of Systems  Constitutional: Positive for weight loss. Negative for chills and fever.  HENT: Negative for hearing loss and tinnitus.   Eyes: Negative for pain and redness.  Respiratory: Negative for cough and shortness of breath.   Cardiovascular: Negative for chest pain and palpitations.  Gastrointestinal: Negative for abdominal pain, blood in stool, constipation, diarrhea, heartburn, melena, nausea and vomiting.       +dysphagia  Genitourinary: Negative for flank pain and hematuria.  Musculoskeletal: Negative for falls and joint pain.  Skin: Negative for itching and rash.  Neurological: Negative for seizures and loss of consciousness.  Endo/Heme/Allergies: Negative for polydipsia. Does not bruise/bleed easily.  Psychiatric/Behavioral: Negative for substance abuse. The patient is not nervous/anxious.      Physical Exam: Vital signs: Vitals:   12/30/20 0420 12/30/20 0738  BP: (!) 196/115 (!) 139/129  Pulse: 73 70  Resp: 19 18  Temp: 98.1 F (36.7 C)   SpO2: 100% 100%    Last BM Date: 12/28/20  Physical Exam Vitals reviewed.  Constitutional:      General: She is not in acute distress. HENT:     Head: Normocephalic and atraumatic.     Nose: Nose normal. No congestion.     Mouth/Throat:     Mouth: Mucous membranes are moist.     Pharynx: Oropharynx is clear.  Eyes:     Extraocular Movements: Extraocular movements intact.     Conjunctiva/sclera: Conjunctivae normal.  Cardiovascular:     Rate and Rhythm: Normal rate and regular rhythm.  Pulmonary:     Effort: Pulmonary effort is normal. No respiratory distress.  Abdominal:     General: Bowel sounds are normal. There is no distension.     Palpations: Abdomen is soft. There is no mass.     Tenderness: There is no abdominal tenderness. There is no guarding or rebound.     Hernia: No hernia is present.  Musculoskeletal:        General: No swelling or tenderness.     Cervical back: Normal range of motion and neck supple.  Lymphadenopathy:     Cervical: Cervical adenopathy: .  Skin:    General: Skin is warm and dry.  Neurological:     Mental Status: She is alert and oriented to person, place, and time. Mental status is at baseline.  Psychiatric:        Mood and Affect: Mood normal.        Behavior: Behavior normal.      GI:  Lab Results: Recent Labs    12/27/20 1749 12/27/20 1755 12/29/20 0230  WBC 6.9  --  6.5  HGB 12.5 13.9 12.3  HCT 39.8 41.0 39.0  PLT 264  --  257   BMET Recent Labs    12/27/20 1749 12/27/20 1755 12/29/20 0230 12/30/20 0406  NA 138 140 138 137  K 3.5 3.5 3.2* 3.1*  CL 104 103 104 103  CO2 25  --  28 24  GLUCOSE 143* 143* 126* 135*  BUN _0 CREATININE 1.04* 0.90 1.02* 1.00  CALCIUM 9.4  --  9.2 9.1   LFT Recent Labs    12/27/20 1749  PROT 8.1  ALBUMIN 3.5  AST 11*  ALT 8  ALKPHOS 66  BILITOT 0.5   PT/INR Recent Labs    12/27/20 1749  LABPROT 13.6  INR 1.0     Studies/Results: CT CHEST W CONTRAST  Result Date:  12/29/2020 CLINICAL DATA:  Lung nodule seen on CT angiography of the head and neck. EXAM: CT CHEST WITH CONTRAST TECHNIQUE: Multidetector CT imaging of the chest was performed during intravenous contrast administration. CONTRAST:  52m OMNIPAQUE IOHEXOL 300 MG/ML  SOLN COMPARISON:  CT a head neck 12/27/2020, chest radiograph 12/27/2020, CT a chest 06/22/2018 FINDINGS: Cardiovascular: Mild cardiomegaly. No pericardial effusion. Scattered coronary artery calcifications. Pulmonary arteries are normal caliber. No large central filling defects are identified on this non tailored examination of the pulmonary arteries. Atherosclerotic plaque within the normal caliber aorta. No acute luminal abnormality of the imaged aorta. No periaortic stranding or hemorrhage. Shared origin of the brachiocephalic and left common carotid arteries. Minimal calcifications in the proximal great vessels. No major venous abnormalities. Suspect laminar flow mixing artifact in the internal jugular veins. No significant venous reflux. Mediastinum/Nodes: Redemonstration of a patulous, distended thoracic esophagus which contains layering air-fluid levels and ingested material to the level of the thoracic inlet. No acute tracheal abnormality. Thyroid gland and thoracic inlet is unremarkable. No worrisome mediastinal, hilar or axillary adenopathy. Lungs/Pleura: Corresponding well to the opacity seen on the CT angiography of the head neck are several patchy ground-glass and tree-in-bud opacities present in the right upper lobe in a somewhat branching configuration, favored to reflect some acute infectious or inflammatory process including sequela of aspiration. No other concerning pulmonary nodules or masses. No other consolidative process. No pneumothorax or visible effusion. No convincing CT evidence of edema. Some bandlike opacity in the lung bases, likely atelectasis or scarring. Upper Abdomen: High attenuation enteric contrast media layers  dependently within the stomach. No acute abnormalities present in the visualized portions of  the upper abdomen. Scattered colonic diverticula without focal inflammation to suggest diverticulitis. Musculoskeletal: Multilevel degenerative changes are present in the imaged portions of the spine. Multilevel flowing anterior osteophytosis, compatible with features of diffuse idiopathic skeletal hyperostosis (DISH). Additional degenerative changes in the bilateral shoulders. Features of right calcific tendinosis/hydroxyapatite deposition. No acute or worrisome osseous abnormalities. Mild dextrocurvature of the spine. No worrisome chest wall mass or lesion. IMPRESSION: There are scattered mixed ground-glass and tree-in-bud nodular opacities predominantly which appear confined to the right upper lobe. Appearance favors an infectious or inflammatory process including possible sequela of aspiration given a chronically patulous and distended thoracic esophagus containing admixed air and ingested material to the level of the thoracic inlet, which is a similar appearance to comparison CT from 2019. Recommend follow-up noncontrast CT imaging to ensure resolution after therapeutic trial. No other acute intrathoracic process. No other concerning pulmonary nodules or masses. Cardiomegaly.  Coronary artery atherosclerosis. Aortic Atherosclerosis (ICD10-I70.0). Colonic diverticulosis. Multilevel degenerative changes in the spine including features of diffuse idiopathic skeletal hyperostosis. Suspect some hydroxyapatite deposition/calcific tendinosis of the right shoulder. Electronically Signed   By: Lovena Le M.D.   On: 12/29/2020 03:12   MR ANGIO HEAD WO CONTRAST  Result Date: 12/30/2020 CLINICAL DATA:  Follow-up left hemisphere white matter stroke. EXAM: MRI HEAD WITHOUT CONTRAST MRA HEAD WITHOUT CONTRAST TECHNIQUE: Multiplanar, multiecho pulse sequences of the brain and surrounding structures were obtained without intravenous  contrast. Angiographic images of the head were obtained using MRA technique without contrast. COMPARISON:  12/27/2020 FINDINGS: MRI HEAD FINDINGS Brain: Diffusion imaging does not show any progressive change related to 4-5 mm acute infarction of the left centrum semiovale region. Elsewhere, chronic small-vessel ischemic changes are evident throughout the hemispheric white matter. Old small vessel infarctions of the thalami. Old hemorrhagic infarction of the right basal ganglia. No change in those findings since the study of 3 days ago. No hydrocephalus or extra-axial collection. Vascular: Major vessels at the base of the brain show flow. Skull and upper cervical spine: Negative Sinuses/Orbits: Clear/normal Other: None MRA HEAD FINDINGS Both internal carotid arteries are patent through the skull base and siphon region. Some siphon atherosclerotic irregularity. The anterior and middle cerebral vessels are patent. No large or medium vessel occlusion. Moderate distal vessel atherosclerotic irregularity is noted. Motion artifact degrades evaluation of the vertebral arteries. Basilar artery is patent. 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 Posterior circulation branch vessels are patent. Patent bilateral posterior communicating arteries. There is atherosclerotic narrowing and irregularity of the PCA branches with proximal stenosis ease. IMPRESSION: No acute large vessel occlusion. Atherosclerotic irregularity of the distal intracranial branch vessels. 2 mm basilar tip aneurysm. No change in the 4-5 mm acute infarction of the left posterior centrum semiovale white matter. Extensive chronic ischemic changes elsewhere throughout the brain as previously seen. Electronically Signed   By: Nelson Chimes M.D.   On: 12/30/2020 08:57   MR BRAIN WO CONTRAST  Result Date: 12/30/2020 CLINICAL DATA:  Follow-up left hemisphere white matter stroke. EXAM: MRI HEAD WITHOUT CONTRAST MRA HEAD WITHOUT CONTRAST TECHNIQUE: Multiplanar,  multiecho pulse sequences of the brain and surrounding structures were obtained without intravenous contrast. Angiographic images of the head were obtained using MRA technique without contrast. COMPARISON:  12/27/2020 FINDINGS: MRI HEAD FINDINGS Brain: Diffusion imaging does not show any progressive change related to 4-5 mm acute infarction of the left centrum semiovale region. Elsewhere, chronic small-vessel ischemic changes are evident throughout the hemispheric white matter. Old small vessel infarctions of the  thalami. Old hemorrhagic infarction of the right basal ganglia. No change in those findings since the study of 3 days ago. No hydrocephalus or extra-axial collection. Vascular: Major vessels at the base of the brain show flow. Skull and upper cervical spine: Negative Sinuses/Orbits: Clear/normal Other: None MRA HEAD FINDINGS Both internal carotid arteries are patent through the skull base and siphon region. Some siphon atherosclerotic irregularity. The anterior and middle cerebral vessels are patent. No large or medium vessel occlusion. Moderate distal vessel atherosclerotic irregularity is noted. Motion artifact degrades evaluation of the vertebral arteries. Basilar artery is patent. 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 Posterior circulation branch vessels are patent. Patent bilateral posterior communicating arteries. There is atherosclerotic narrowing and irregularity of the PCA branches with proximal stenosis ease. IMPRESSION: No acute large vessel occlusion. Atherosclerotic irregularity of the distal intracranial branch vessels. 2 mm basilar tip aneurysm. No change in the 4-5 mm acute infarction of the left posterior centrum semiovale white matter. Extensive chronic ischemic changes elsewhere throughout the brain as previously seen. Electronically Signed   By: Nelson Chimes M.D.   On: 12/30/2020 08:57   DG Swallowing Func-Speech Pathology  Result Date: 12/28/2020 Objective Swallowing Evaluation: Type  of Study: MBS-Modified Barium Swallow Study  Patient Details Name: Casaundra Takacs MRN: [id number] Date of Birth: [birth year] Today's Date: 12/28/2020 Time: SLP Start Time (ACUTE ONLY): 2409 -SLP Stop Time (ACUTE ONLY): 1400 SLP Time Calculation (min) (ACUTE ONLY): 14 min Past Medical History: Past Medical History: Diagnosis Date . Anxiety  . Chronic lower back pain 04/17/2012  "just got over some; catched when I walked" . COPD (chronic obstructive pulmonary disease) ([place])  . Critical lower limb ischemia ([place]) 05/27/2018 . Depression  . Headache(784.0) 04/17/2012  "~ qod; lately waking up in am w/one" . High cholesterol  . Hypertension  . Migraines 04/17/2012 . Obesity  . Sleep apnea   CPAP . Stroke ([place])  . Type II diabetes mellitus ([place]) 08/28/2002 Past Surgical History: Past Surgical History: Procedure Laterality Date . ABDOMINAL AORTOGRAM W/LOWER EXTREMITY Left 05/27/2018  Procedure: ABDOMINAL AORTOGRAM W/LOWER EXTREMITY Runoff and Possible Intervention;  Surgeon: Waynetta Sandy, MD;  Location: [place] CV LAB;  Service: Cardiovascular;  Laterality: Left; . APPLICATION OF WOUND VAC Left 73/53/2992  Procedure: APPLICATION OF WOUND VAC;  Surgeon: Waynetta Sandy, MD;  Location: [place];  Service: Vascular;  Laterality: Left; . BIOPSY  04/04/2018  Procedure: BIOPSY;  Surgeon: [name], MD;  Location: [facility] ENDOSCOPY;  Service: Endoscopy;; . BIOPSY  10/27/2020  Procedure: BIOPSY;  Surgeon: Arta Silence, MD;  [facility] ENDOSCOPY;  Service: Endoscopy;; . CARDIAC CATHETERIZATION  ~ 2011 . COLONOSCOPY N/A 04/04/2018  Procedure: COLONOSCOPY;  Surgeon: [name], MD;  Location: [facility] ENDOSCOPY;  Service: Endoscopy;  Laterality: N/A; . COLONOSCOPY WITH PROPOFOL N/A 10/27/2020  Procedure: COLONOSCOPY WITH PROPOFOL;  Surgeon: Arta Silence, MD;  [facility] ENDOSCOPY;  Service: Endoscopy;  Laterality: N/A; . ESOPHAGOGASTRODUODENOSCOPY (EGD) WITH PROPOFOL N/A 10/27/2020  Procedure: ESOPHAGOGASTRODUODENOSCOPY  (EGD) WITH PROPOFOL  WITH POSSIBLE DIL;  Surgeon: Arta Silence, MD;  [facility] ENDOSCOPY;  Service: Endoscopy;  Laterality: N/A; . LOWER EXTREMITY ANGIOGRAM Left 05/27/2018 . PERIPHERAL VASCULAR INTERVENTION Left 05/27/2018  Procedure: PERIPHERAL VASCULAR INTERVENTION;  Surgeon: Waynetta Sandy, MD;  Location: [place] CV LAB;  Service: Cardiovascular;  Laterality: Left; . POLYPECTOMY  04/04/2018  Procedure: POLYPECTOMY;  Surgeon: [name], MD;  Location: [facility] ENDOSCOPY;  Service: Endoscopy;; . POLYPECTOMY  10/27/2020  Procedure: POLYPECTOMY;  Surgeon: Arta Silence,  MD;  [facility] ENDOS[facility];  Service: Endoscopy;; . TRANSMETATARSAL AMPUTATION Left 05/28/2018  Procedure: AMPUTATION TOES THREE, FOUR AND FIVE on left foot;  Surgeon: Waynetta Sandy, MD;  Location: [place];  Service: Vascular;  Laterality: Left; . WOUND DEBRIDEMENT Left 06/06/2018  Procedure: DEBRIDEMENT WOUND LEFT FOOT;  Surgeon: Waynetta Sandy, MD;  Location: [facility] OR;  Service: Vascular;  Laterality: Left; HPI: Pt is a 50 yo female with a h/o 4 prior strokes presents wtih an acute worsening of BLE pain and dysarthria. MRI showed an acute infarction of the L hemispheric white matter in the upper parietal centrum semiovale region. PMH also includes: DM2, HTN, HLD, smoking, prior cocaine abuse, PE, medication nonadhering. Most recent MBS 12/08/19 revealed aspiration of thin liquids (PAS 7, 8) with recs for Dys 2/Nectar. She reports advancing to regular solids/thin liquids at SNF after FEES. Pt also had dysarthria at that time impacting word to short phrase level.  Subjective: eager to eat and drink Assessment / Plan / Recommendation CHL IP CLINICAL IMPRESSIONS 12/28/2020 Clinical Impression Pt presents with a mild oral dysphagia with pharyngeal phase relatively WFL. She has intermittent oral holding with liquids, with mild lingual residue also noted primarily on her first few sips. She also has loss of regular solids to  the pyriform sinuses while she is still masticating, but there is no aspiration across all consistencies. Recommend starting a diet consisting of regular solids and thin liquids. Given coughing noted during initial evaluation, will f/u briefly. SLP Visit Diagnosis Dysphagia, oral phase (R13.11) Attention and concentration deficit following -- Frontal lobe and executive function deficit following -- Impact on safety and function Mild aspiration risk   CHL IP TREATMENT RECOMMENDATION 12/28/2020 Treatment Recommendations Therapy as outlined in treatment plan below   Prognosis 12/28/2020 Prognosis for Safe Diet Advancement Good Barriers to Reach Goals -- Barriers/Prognosis Comment -- CHL IP DIET RECOMMENDATION 12/28/2020 SLP Diet Recommendations Regular solids;Thin liquid Liquid Administration via Cup;Straw Medication Administration Whole meds with liquid Compensations Slow rate;Small sips/bites Postural Changes Seated upright at 90 degrees;Remain semi-upright after after feeds/meals (Comment)   CHL IP OTHER RECOMMENDATIONS 12/28/2020 Recommended Consults -- Oral Care Recommendations Oral care BID Other Recommendations --   CHL IP FOLLOW UP RECOMMENDATIONS 12/28/2020 Follow up Recommendations Inpatient Rehab   CHL IP FREQUENCY AND DURATION 12/28/2020 Speech Therapy Frequency (ACUTE ONLY) min 2x/week Treatment Duration 2 weeks      CHL IP ORAL PHASE 12/28/2020 Oral Phase Impaired Oral - Pudding Teaspoon -- Oral - Pudding Cup -- Oral - Honey Teaspoon -- Oral - Honey Cup -- Oral - Nectar Teaspoon -- Oral - Nectar Cup -- Oral - Nectar Straw -- Oral - Thin Teaspoon -- Oral - Thin Cup Lingual/palatal residue;Holding of bolus Oral - Thin Straw Holding of bolus Oral - Puree WFL Oral - Mech Soft -- Oral - Regular Premature spillage;Impaired mastication Oral - Multi-Consistency -- Oral - Pill WFL Oral Phase - Comment --  CHL IP PHARYNGEAL PHASE 12/28/2020 Pharyngeal Phase WFL Pharyngeal- Pudding Teaspoon -- Pharyngeal -- Pharyngeal- Pudding Cup  -- Pharyngeal -- Pharyngeal- Honey Teaspoon -- Pharyngeal -- Pharyngeal- Honey Cup -- Pharyngeal -- Pharyngeal- Nectar Teaspoon -- Pharyngeal -- Pharyngeal- Nectar Cup -- Pharyngeal -- Pharyngeal- Nectar Straw -- Pharyngeal -- Pharyngeal- Thin Teaspoon -- Pharyngeal -- Pharyngeal- Thin Cup -- Pharyngeal -- Pharyngeal- Thin Straw -- Pharyngeal -- Pharyngeal- Puree -- Pharyngeal -- Pharyngeal- Mechanical Soft -- Pharyngeal -- Pharyngeal- Regular -- Pharyngeal -- Pharyngeal- Multi-consistency -- Pharyngeal -- Pharyngeal- Pill -- Pharyngeal --  Pharyngeal Comment --  CHL IP CERVICAL ESOPHAGEAL PHASE 12/28/2020 Cervical Esophageal Phase WFL Pudding Teaspoon -- Pudding Cup -- Honey Teaspoon -- Honey Cup -- Nectar Teaspoon -- Nectar Cup -- Nectar Straw -- Thin Teaspoon -- Thin Cup -- Thin Straw -- Puree -- Mechanical Soft -- Regular -- Multi-consistency -- Pill -- Cervical Esophageal Comment -- Osie Bond., M.A. CCC-SLP Acute Rehabilitation Services Pager [phone] Office [phone] 12/28/2020, 3:02 PM              DG ESOPHAGUS W SINGLE CM (SOL OR THIN BA)  Result Date: 12/30/2020 CLINICAL DATA:  Soft ill dysphagia. Feels like food gets stuck in her throat and has trouble burping since her stroke last year EXAM: ESOPHOGRAM/BARIUM SWALLOW TECHNIQUE: Single contrast examination was performed using  thin barium. FLUOROSCOPY TIME:  Fluoroscopy Time:  3 minutes 12 seconds Radiation Exposure Index (if provided by the fluoroscopic device): 49.2 mGy Number of Acquired Spot Images: 7 COMPARISON:  Chest CT 12/29/2020 FINDINGS: Thoracic esophagram was performed utilizing thin barium only in the LPO position due to patient condition. There is severe esophageal dysmotility with impaired relaxation of the lower esophageal sphincter not coordinated with esophageal contractions. There is stasis of barium in the esophagus and esophageal dilation. There is distal segment narrowing of approximately 3.5 cm. IMPRESSION: Distal esophageal  narrowing with segment measuring approximately 3.0 cm, with upstream esophageal dilation, and esophageal dysmotility with contractions not in coordination with the lower esophageal sphincter. Findings are compatible with a diagnosis of achalasia. Recommend gastroenterology referral/consultation. Electronically Signed   By: Maurine Simmering   On: 12/30/2020 11:40    Impression: Dysphagia, barium swallow 5/5 suggestive of achalasia: Distal esophageal narrowing with segment measuring approximately 3.0 cm, with upstream esophageal dilation, and esophageal dysmotility with contractions not in coordination with the lower esophageal sphincter. Findings are compatible with a diagnosis of achalasia.  History of H pylori gastritis -Normal Hgb (12.3) as of 5/4  CVA  Aspiration PNA  DM type 2, HTN  Plan: EGD tomorrow with possible balloon dilation and Botox injection.  I thoroughly discussed the procedure with the patient to include nature, alternatives, benefits, and risks including but not limited to bleeding, infection, perforation, anesthesia/cardiopulmonary complications).  Patient verbalized understanding and gave verbal consent to proceed with EGD, balloon dilation, and Botox injection.  At time of procedures, repeat biopsies for H. pylori can be completed.  NPO at midnight.  [facility] will follow.    LOS: 3 days   Salley Slaughter  PA-C 12/30/2020, 1:04 PM  Contact #  [phone]

## 2020-12-30 NOTE — Code Documentation (Addendum)
Stroke Response Nurse Documentation Code Documentation  Shelene Krage is a 50 y.o. female admitted to [facility] on 12/27/2020 with findings of a left cerebral hemispheric acute ischemic infarction. On this admission she was noted to have worsening dysarthria and left sided weakness.  Past medical hx of 4 prior strokes: 3 ischemic, 1 hemorrhagic. Pt has chronic L sided deficits from the hemorrhagic stroke.Additional hx of DM2, HTN, HLD, smoking, prior cocaine abuse, PE (no longer on anticoagulation).   Code stroke was activated by primary RN. Patient LKW at [phone] when RN took her a blanket, when RN went in at [phone] she noted her speech to worsen and found her to have diffuse weakness. On Lovenox for VTE prophylaxis . Stroke team at the bedside. Patient to CT with team and decision made to take pt directly to MRI. NIHSS 16, see documentation for details and code stroke times. Patient with disoriented, left facial droop, bilateral arm weakness, bilateral leg weakness, left decreased sensation, Expressive aphasia  and dysarthria  on exam. The following imaging was completed: MRI. Per Dr. Erlinda Hong, no new findings noted on MRI.   Care/Plan: q2h NIHSS and VS  Handoff provided to [place], RN    Blanchard Mane Yerania Chamorro  Rapid Response RN

## 2020-12-31 ENCOUNTER — Encounter (HOSPITAL_COMMUNITY): Payer: Self-pay | Admitting: Internal Medicine

## 2020-12-31 ENCOUNTER — Inpatient Hospital Stay (HOSPITAL_COMMUNITY): Payer: Medicare Other | Admitting: Anesthesiology

## 2020-12-31 ENCOUNTER — Encounter (HOSPITAL_COMMUNITY)
Admission: EM | Disposition: A | Discharge status: Skilled Nursing Facility | Payer: Self-pay | Source: Home / Self Care | Attending: Family Medicine

## 2020-12-31 DIAGNOSIS — I10 Essential (primary) hypertension: Secondary | ICD-10-CM | POA: Diagnosis not present

## 2020-12-31 DIAGNOSIS — R1310 Dysphagia, unspecified: Secondary | ICD-10-CM | POA: Diagnosis not present

## 2020-12-31 DIAGNOSIS — R911 Solitary pulmonary nodule: Secondary | ICD-10-CM | POA: Diagnosis not present

## 2020-12-31 DIAGNOSIS — R918 Other nonspecific abnormal finding of lung field: Secondary | ICD-10-CM | POA: Diagnosis not present

## 2020-12-31 HISTORY — PX: BOTOX INJECTION: SHX5754

## 2020-12-31 HISTORY — PX: BALLOON DILATION: SHX5330

## 2020-12-31 HISTORY — PX: ESOPHAGOGASTRODUODENOSCOPY: SHX5428

## 2020-12-31 HISTORY — PX: FOREIGN BODY REMOVAL: SHX962

## 2020-12-31 LAB — GLUCOSE, CAPILLARY
Glucose-Capillary: 152 mg/dL — ABNORMAL HIGH (ref 70–99)
Glucose-Capillary: 78 mg/dL (ref 70–99)
Glucose-Capillary: 100 mg/dL — ABNORMAL HIGH (ref 70–99)
Glucose-Capillary: 257 mg/dL — ABNORMAL HIGH (ref 70–99)

## 2020-12-31 LAB — BASIC METABOLIC PANEL
Anion gap: 9 (ref 5–15)
BUN: 10 mg/dL (ref 6–20)
CO2: 23 mmol/L (ref 22–32)
Calcium: 9.2 mg/dL (ref 8.9–10.3)
Chloride: 106 mmol/L (ref 98–111)
Creatinine, Ser: 1.05 mg/dL — ABNORMAL HIGH (ref 0.44–1.00)
GFR, Estimated: >60 mL/min (ref >60)
Glucose, Bld: 144 mg/dL — ABNORMAL HIGH (ref 70–99)
Potassium: 3.6 mmol/L (ref 3.5–5.1)
Sodium: 138 mmol/L (ref 135–145)

## 2020-12-31 SURGERY — EGD (ESOPHAGOGASTRODUODENOSCOPY)
Anesthesia: Monitor Anesthesia Care

## 2020-12-31 MED ORDER — LABETALOL HCL 5 MG/ML IV SOLN
INTRAVENOUS | Status: AC
  Filled 2020-12-31: qty 4

## 2020-12-31 MED ORDER — SODIUM CHLORIDE (PF) 0.9 % IJ SOLN
INTRAMUSCULAR | Status: AC
  Filled 2020-12-31: qty 10

## 2020-12-31 MED ORDER — PROPOFOL 500 MG/50ML IV EMUL
INTRAVENOUS | Status: DC | PRN
  Administered 2020-12-31: 125 ug/kg/min via INTRAVENOUS

## 2020-12-31 MED ORDER — ONABOTULINUMTOXINA 100 UNITS IJ SOLR
INTRAMUSCULAR | Status: AC
  Filled 2020-12-31: qty 100

## 2020-12-31 MED ORDER — LABETALOL HCL 5 MG/ML IV SOLN
10.0000 mg | Freq: Once | INTRAVENOUS | Status: AC
  Administered 2020-12-31: 10 mg via INTRAVENOUS

## 2020-12-31 MED ORDER — LACTATED RINGERS IV SOLN: INTRAVENOUS | Status: DC | PRN

## 2020-12-31 MED ORDER — PROPOFOL 10 MG/ML IV BOLUS
INTRAVENOUS | Status: DC | PRN
  Administered 2020-12-31: 20 mg via INTRAVENOUS

## 2020-12-31 MED ORDER — LIDOCAINE 2% (20 MG/ML) 5 ML SYRINGE
INTRAMUSCULAR | Status: DC | PRN
  Administered 2020-12-31: 50 mg via INTRAVENOUS
  Administered 2020-12-31: 20 mg via INTRAVENOUS

## 2020-12-31 MED ORDER — ONABOTULINUMTOXINA 100 UNITS IJ SOLR
100.0000 [IU] | INTRAMUSCULAR | Status: AC
  Filled 2020-12-31: qty 100

## 2020-12-31 MED ORDER — SODIUM CHLORIDE (PF) 0.9 % IJ SOLN
INTRAMUSCULAR | Status: DC | PRN
  Administered 2020-12-31: 4 mL via SUBMUCOSAL

## 2020-12-31 NOTE — Brief Op Note (Signed)
12/27/2020 - 12/31/2020  2:56 PM  PATIENT:  Diamond Rice  50 y.o. female  PRE-OPERATIVE DIAGNOSIS:  dysphagia, abnormal barium esophagram  POST-OPERATIVE DIAGNOSIS:  Food removed from esophagus, balloon dilation, botox injection   PROCEDURE:  Procedure(s) with comments: ESOPHAGOGASTRODUODENOSCOPY (EGD) (N/A) - balloon dilation, botox BOTOX INJECTION (N/A) BALLOON DILATION (N/A) FOREIGN BODY REMOVAL  SURGEON:  Surgeon(s) and Role:    Ronnette Juniper, MD - Primary  PHYSICIAN ASSISTANT:   ASSISTANTS: Lynett Fish, Ladona Ridgel, Tech   ANESTHESIA:   MAC  EBL:  Minimal  BLOOD ADMINISTERED:none  DRAINS: none   LOCAL MEDICATIONS USED:  NONE  SPECIMEN:  No Specimen  DISPOSITION OF SPECIMEN:  N/A  COUNTS:  YES  TOURNIQUET:  * No tourniquets in log *  DICTATION: .Dragon Dictation  PLAN OF CARE: Admit to inpatient   PATIENT DISPOSITION:  PACU - hemodynamically stable.   Delay start of Pharmacological VTE agent (>24hrs) due to surgical blood loss or risk of bleeding: not applicable

## 2020-12-31 NOTE — TOC Initial Note (Signed)
Transition of Care [facility]) - Initial/Assessment Note    Patient Details  Name: Diamond Rice MRN: [id number] Date of Birth: April 09, 1971  Transition of Care [facility]) CM/SW Contact:    Geralynn Ochs, LCSW Phone Number: 12/31/2020, 4:10 PM  Clinical Narrative:     CSW met with patient to discuss SNF, and confirmed patient is agreeable. CSW provided bed offers to her, and said she would come back to check on choice. When CSW came back around to see patient, she was gone from the room. CSW to check back in with patient when able.              Expected Discharge Plan: Skilled Nursing Facility Barriers to Discharge: Continued Medical Work up   Patient Goals and CMS Choice Patient states their goals for this hospitalization and ongoing recovery are:: to get rehab CMS Medicare.gov Compare Post Acute Care list provided to:: Patient Choice offered to / list presented to : Patient  Expected Discharge Plan and Services Expected Discharge Plan: [place] Choice: [place] Living arrangements for the past 2 months: Apartment                                      Prior Living Arrangements/Services Living arrangements for the past 2 months: Apartment   Patient language and need for interpreter reviewed:: No Do you feel safe going back to the place where you live?: Yes      Need for Family Participation in Patient Care: No (Comment) Care giver support system in place?: No (comment)   Criminal Activity/Legal Involvement Pertinent to Current Situation/Hospitalization: No - Comment as needed  Activities of Daily Living Home Assistive Devices/Equipment: None ADL Screening (condition at time of admission) Patient's cognitive ability adequate to safely complete daily activities?: Yes Is the patient deaf or have difficulty hearing?: No Does the patient have difficulty seeing, even when wearing glasses/contacts?: No Does the patient  have difficulty concentrating, remembering, or making decisions?: No Patient able to express need for assistance with ADLs?: Yes Does the patient have difficulty dressing or bathing?: No Independently performs ADLs?: Yes (appropriate for developmental age) Does the patient have difficulty walking or climbing stairs?: No Weakness of Legs: None Weakness of Arms/Hands: None  Permission Sought/Granted Permission sought to share information with : Chartered certified accountant granted to share information with : Yes, Verbal Permission Granted     Permission granted to share info w AGENCY: SNF        Emotional Assessment Appearance:: Appears stated age Attitude/Demeanor/Rapport: Engaged Affect (typically observed): Appropriate Orientation: : Oriented to Self,Oriented to Place,Oriented to  Time,Oriented to Situation Alcohol / Substance Use: Illicit Drugs Psych Involvement: No (comment)  Admission diagnosis:  Lung nodule [R91.1] Acute ischemic stroke ([place]) [I63.9] Hypertension, unspecified type [I10] Patient Active Problem List   Diagnosis Date Noted  . Acute ischemic stroke ([place]) 12/27/2020  . Abnormal finding on lung imaging 12/27/2020  . Severe episode of recurrent major depressive disorder, with psychotic features ([place])   . Acute CVA (cerebrovascular accident) ([place]) 12/06/2019  . AKI (acute kidney injury) ([place]) 12/06/2019  . LVH (left ventricular hypertrophy) 08/10/2019  . Edema of lower extremity 11/26/2018  . Loss of appetite 11/26/2018  . Urinary incontinence 11/26/2018  . Allergic rhinitis 11/14/2018  . Deep venous thrombosis ([place]) 11/14/2018  . Nicotine dependence 11/14/2018  . Hemorrhagic stroke (  HCC)   . Diastolic dysfunction   . Type 2 diabetes mellitus ([place])   . Anemia of chronic disease   . Chronic obstructive pulmonary disease ([place])   . ICH (intracerebral hemorrhage) ([place]) 08/30/2018  . Hypertensive heart disease without heart failure 07/04/2018  .  Nonrheumatic aortic valve insufficiency 07/04/2018  . Esophageal abnormality   . Class 2 severe obesity due to excess calories with serious comorbidity and body mass index (BMI) of 35.0 to 35.9 in adult [facility])   . Peripheral vascular disease ([place])   . History of osteomyelitis   . History of amputation of lesser toe ([place])   . Pulmonary embolism ([place]) 06/22/2018  . Microcytic anemia 06/17/2018  . At risk for adverse drug reaction 06/11/2018  . Critical lower limb ischemia ([place]) 05/27/2018  . GI bleeding 04/02/2018  . Acute renal failure (ARF) ([place]) 04/02/2018  . Hypokalemia 04/02/2018  . Polysubstance abuse ([place]) 04/02/2018  . Diabetic foot ulcer ([place]) 04/02/2018  . Tinea pedis 03/07/2018  . Diabetic peripheral neuropathy ([place]) 01/24/2018  . Cannabis use disorder, moderate, dependence ([place]) 05/19/2017  . Cocaine use disorder, moderate, dependence ([place]) 05/19/2017  . CKD (chronic kidney disease) stage 3, GFR 30-59 ml/min (HCC) 02/17/2017  . Noncompliance with medications 02/17/2017  . Suicide attempt ([name]) 11/06/2016  . Mild cognitive impairment 03/21/2016  . Moderate episode of recurrent major depressive disorder ([place]) 03/21/2016  . B12 deficiency 03/09/2016  . Iron deficiency anemia 03/07/2016  . History of CVA with residual deficit 12/30/2015  . Schizoaffective disorder, bipolar type ([place]) 10/07/2015  . OSA (obstructive sleep apnea) 06/09/2015  . Vitamin D deficiency 01/06/2015  . Bilateral knee pain 01/05/2015  . Numbness and tingling in right hand 01/05/2015  . Insomnia 12/07/2014  . Right-sided lacunar infarction ([place]) 09/15/2014  . Left hemiparesis ([place]) 09/15/2014  . Dyspnea   . Back pain 09/11/2013  . Psychoactive substance-induced organic mood disorder ([place]) 05/28/2013  . Cocaine abuse with cocaine-induced mood disorder ([place]) 05/28/2013  . Cannabis dependence with cannabis-induced anxiety disorder ([place]) 05/28/2013  . Morbid obesity with BMI of 45.0-49.9, adult ([place])  04/25/2012  . Chest pain 04/17/2012  . Hypertension 04/17/2012  . DM (diabetes mellitus) with peripheral vascular complication ([place]) 09/27/4386  . Hyperlipidemia 04/17/2012  . Tobacco use 04/17/2012   PCP:  [name], MD Pharmacy:   [facility] [address] [place], [address] [place] [address] Phone: [phone] Fax: [phone]     Social Determinants of Health (SDOH) Interventions    Readmission Risk Interventions No flowsheet data found.

## 2020-12-31 NOTE — Progress Notes (Signed)
Occupational Therapy Treatment Patient Details Name: Diamond Rice MRN: [id number] DOB: April 04, 1971 Today's Date: 12/31/2020    History of present illness Pt is a 50 y/o female admitted 5/2 secondary to increased difficulty ambulating and difficulty speaking. MRI of brain showed 4-88mm acute infarct of the Lt hemispheric white matter in teh upper parietal sentrum semiovale region as well as extensive chronic ischemic changes elsewhere throughout the brain.  PMH includes CVA, DM2, HTN, HLD, smoking, prior cocaine abuse, PE (no longer on anticoagulation).   OT comments  Pt seen in conjunction with PT.  She demonstrates improved balance and improved independence with ADLs, functional mobility, and functional use of Lt UE.  She is able to complete ADLs with min guard to min A, and is able to reach for items with Lt UE to ~120*.   Will continue to follow.   Follow Up Recommendations  CIR    Equipment Recommendations  3 in 1 bedside commode;Wheelchair (measurements OT);Wheelchair cushion (measurements OT)    Recommendations for Other Services      Precautions / Restrictions Precautions Precautions: Fall Restrictions Weight Bearing Restrictions: No       Mobility Bed Mobility Overal bed mobility: Needs Assistance Bed Mobility: Supine to Sit     Supine to sit: Min guard     General bed mobility comments: min guard for safety, no physical assist required    Transfers Overall transfer level: Needs assistance Equipment used: Quad cane Transfers: Sit to/from Stand Sit to Stand: Min guard;+2 safety/equipment         General transfer comment: min guard for safety. Patient with good hand placement    Balance Overall balance assessment: Needs assistance Sitting-balance support: No upper extremity supported;Feet supported Sitting balance-Leahy Scale: Fair     Standing balance support: Single extremity supported;During functional activity Standing balance-Leahy Scale:  Poor Standing balance comment: Reliant on UE and external support                           ADL either performed or assessed with clinical judgement   ADL Overall ADL's : Needs assistance/impaired                     Lower Body Dressing: Minimal assistance;Maximal assistance;Sit to/from stand Lower Body Dressing Details (indicate cue type and reason): pt able to don socks with min A for sitting balance, and min A for standing balance Toilet Transfer: Minimal assistance;+2 for safety/equipment;Ambulation;Comfort height toilet (quad cane)           Functional mobility during ADLs: Minimal assistance;+2 for safety/equipment (quad cane)       Vision       Perception     Praxis      Cognition Arousal/Alertness: Awake/alert Behavior During Therapy: WFL for tasks assessed/performed Overall Cognitive Status: No family/caregiver present to determine baseline cognitive functioning                                 General Comments: Increased time to process and speech. Highly motivated to work with therapies        Exercises Exercises: Other exercises Other Exercises Other Exercises: passive stretch of Lt UE with stretch for elbow extension, shoulder to ~120*, and wrist and hand in composite extension.  Worked on functional reach of Lt UE with pt able to reach for and retrieve items at ~120* with Lt UE and  no facilitation   Shoulder Instructions       General Comments      Pertinent Vitals/ Pain       Pain Assessment: Faces Faces Pain Scale: No hurt Pain Intervention(s): Monitored during session  Home Living                                          Prior Functioning/Environment              Frequency  Min 2X/week        Progress Toward Goals  OT Goals(current goals can now be found in the care plan section)  Progress towards OT goals: Progressing toward goals  Acute Rehab OT Goals Patient Stated Goal:  to regain independence  Plan Discharge plan remains appropriate    Co-evaluation    PT/OT/SLP Co-Evaluation/Treatment: Yes Reason for Co-Treatment: To address functional/ADL transfers PT goals addressed during session: Mobility/safety with mobility;Balance;Proper use of DME OT goals addressed during session: ADL's and self-care      AM-PAC OT "6 Clicks" Daily Activity     Outcome Measure   Help from another person eating meals?: A Little Help from another person taking care of personal grooming?: A Little Help from another person toileting, which includes using toliet, bedpan, or urinal?: A Little Help from another person bathing (including washing, rinsing, drying)?: A Little Help from another person to put on and taking off regular upper body clothing?: A Little Help from another person to put on and taking off regular lower body clothing?: A Little 6 Click Score: 18    End of Session Equipment Utilized During Treatment: Gait belt  OT Visit Diagnosis: Unsteadiness on feet (R26.81);Other abnormalities of gait and mobility (R26.89);Muscle weakness (generalized) (M62.81)   Activity Tolerance Patient tolerated treatment well   Patient Left in chair;with call bell/phone within reach;with chair alarm set   Nurse Communication Mobility status        Time: 4536-4680 OT Time Calculation (min): 27 min  Charges: OT General Charges $OT Visit: 1 Visit OT Treatments $Neuromuscular Re-education: 8-22 mins  Nilsa Nutting OTR/L Acute Rehabilitation Services Pager [phone] Office [phone]    Lucille Passy M 12/31/2020, 5:27 PM

## 2020-12-31 NOTE — Progress Notes (Signed)
[facility]  PROGRESS NOTE  Diamond Rice ZOX:[id number] DOB: 04-25-1971 DOA: 12/27/2020 PCP: [name], MD   Brief HPI:   50 year old female with medical history of diabetes mellitus type 2, hypertension, hyperlipidemia, tobacco abuse, prior cocaine abuse, PE, no longer on anticoagulation, medication nonadherence.  She has history of 4 prior strokes, 3 ischemic nonhemorrhagic.  She has chronic left-sided deficits from hemorrhagic stroke.  Most recent stroke was ischemic in April 2021.  Came to ED with worsening bilateral leg pain and dysarthria.  Patient was noted to be hypertensive.  She was admitted for acute CVA, patient with slurred speech and left-sided weakness.  Stroke work-up completed.  Since harmlessly not appropriate for CIR, awaiting skilled nursing facility.  Also has a history of PAD, COPD, CKD stage II.  Course complicated by recurrent strokelike symptoms on 12/30/2020, code stroke was activated, neurology follow-up, repeat MRI showed no acute findings.    Subjective   Patient seen and examined, plan for EGD and Botox injection per GI today.     Assessment/Plan:     1. Acute ischemic stroke-patient presents with dysarthria and admitted to walk.  Neurology was consulted, patient was found to have CR infarct likely from small vessel disease due to uncontrolled risk factors including hypertension hyperlipidemia.  CT head showed no acute large vessel occlusion.  Chronic small vessel ischemic changes throughout the brain, progressive since April 2021.  CT head and neck showed no large or medium vessel occlusion.  MRI showed 4 to 5 mm acute infarction of left hemispheric white matter in the upper parietal centrum semiovale region.  2D echo showed EF 65%, LDL 136, A1c 6.6.  UDS positive for THC.  Continue statin.  PT/OT recommended CIR, patient reportedly homeless which makes disposition difficult.  Cannot go to CIR.  Insurance authorization pending for skilled nursing  facility.  As per rehab MD recommendation, needs outpatient referral to PMR for Botox of left upper extremity to help with spasticity.  As per neurology patient is on aspirin 81 mg daily prior to admission now on aspirin 81 mg daily continue same regimen at discharge.  No DAPT given history of ICH and uncontrolled hypertension.  On morning of 5/5 patient had recurrence of strokelike symptoms, code stroke was activated.  Neurology follow-up.  Repeat MRI without acute change, still has evolving left CR small infarct, chronic right basal ganglia MCH.  Discussed with Dr. Erlinda Hong, who felt there was recrudescence of old stroke in the setting of anxiety and depression versus conversion disorder.  He recommended continuing current management and consider antidepressant if needed.  Patient has clinically improved.   2. Abnormal finding on lung imaging/suspected chronic aspiration pneumonia-CT of the neck showed 2.8 cm solid lesion in the lateral aspect of right upper lobe.  CT chest with contrast on 5/4 showed scattered mixed groundglass and tree-in-bud nodular opacity predominantly which appear confined to right upper lobe.  Appearance favors an infectious or inflammatory process including possible sequelae of aspiration given a chronic patulous and distended thoracic esophagus containing admixed air with ingested material to the level of thoracic inlet which is similar in appearance compared to CT from 2019.  Recommend follow-up noncontrast CT imaging to ensure resolution after therapeutic trial.  No other acute process or concerning pulmonary nodules or masses.  Patient does report history of what appears to be aspiration/choking episodes when attempting to burp.  Started on oral Augmentin for 1 week.  Consider repeat CT chest in 4 weeks.  The case  was reviewed with PCCM on 12/29/2020 by Dr. Algis Liming allergy who agreed with the management. 3. Dysphagia-suspect related to stroke.  EGD on 10/27/2020 by [facility] showed normal  esophagus.  Speech therapy was consulted for swallow evaluation dietary recommendations.  They recommended barium swallow.  Barium swallow was abnormal, suggestive of achalasia.  Gastroenterology was consulted.  Plan for EGD with Botox injection today. 4. Hypertension-allowing permissive hypertension given acute CVA and gradually normalize in 3 to 5 days.  Home medications currently on hold.  She has been markedly hypertensive on 12/29/2020 with BP as high as 221/102.  Patient's home medication including Toprol-XL and amlodipine have been resumed.  Blood pressure is still elevated, continue lisinopril, hydralazine at prior home dose. 5. Polysubstance abuse-toxicology was positive for THC.  Cessation counseling provided. 6. Hyperlipidemia-lipid panel shows total cholesterol 209, HDL 60, LDL 136, triglyceride 63.  Continue statin. 7. Hypokalemia-replete, today potassium is 3.6.   Scheduled medications:   . amLODipine  10 mg Oral Daily  . amoxicillin-clavulanate  1 tablet Oral Q12H  . ARIPiprazole  5 mg Oral Daily  . aspirin  81 mg Oral Daily  . atorvastatin  80 mg Oral q1800  . botulinum toxin Type A  100 Units Intramuscular To Endo  . chlorhexidine  15 mL Mouth Rinse BID  . enoxaparin (LOVENOX) injection  40 mg Subcutaneous Q24H  . hydrALAZINE  100 mg Oral Q8H  . insulin aspart  0-9 Units Subcutaneous TID WC  . lisinopril  40 mg Oral Daily  . mouth rinse  15 mL Mouth Rinse q12n4p  . metoprolol succinate  50 mg Oral q morning  . mirtazapine  15 mg Oral QHS         Data Reviewed:   CBG:  Recent Labs  Lab 12/30/20 1601 12/30/20 2108 12/31/20 0606 12/31/20 1131 12/31/20 1531  GLUCAP 176* 176* 152* 78 100*    SpO2: 100 %    Vitals:   12/31/20 1343 12/31/20 1416 12/31/20 1452 12/31/20 1533  BP: (!) 232/124 (!) 206/110 (!) 133/102 (!) 170/99  Pulse: 85  86 81  Resp: (!) 35  18 18  Temp: 98.4 F (36.9 C)  (!) 97.5 F (36.4 C) (!) 97.5 F (36.4 C)  TempSrc: Temporal   Temporal Oral  SpO2: 100%  100% 100%  Weight: 77 kg     Height: 5\' 1"  (1.549 m)        Intake/Output Summary (Last 24 hours) at 12/31/2020 1732 Last data filed at 12/31/2020 1447 Gross per 24 hour  Intake 400 ml  Output 400 ml  Net 0 ml    05/04 1901 - 05/06 0700 In: 520 [P.O.:520] Out: -   Filed Weights   12/28/20 0839 12/31/20 1343  Weight: 77 kg 77 kg    CBC:  Recent Labs  Lab 12/27/20 1749 12/27/20 1755 12/29/20 0230  WBC 6.9  --  6.5  HGB 12.5 13.9 12.3  HCT 39.8 41.0 39.0  PLT 264  --  257  MCV 73.8*  --  73.0*  MCH 23.2*  --  23.0*  MCHC 31.4  --  31.5  RDW 16.2*  --  16.0*  LYMPHSABS 2.8  --   --   MONOABS 0.7  --   --   EOSABS 0.2  --   --   BASOSABS 0.1  --   --     Complete metabolic panel:  Recent Labs  Lab 12/27/20 1749 12/27/20 1755 12/28/20 0349 12/29/20 0230 12/30/20 0406 12/31/20  0354  NA 138 140  --  138 137 138  K 3.5 3.5  --  3.2* 3.1* 3.6  CL 104 103  --  104 103 106  CO2 25  --   --  28 24 23   GLUCOSE 143* 143*  --  126* 135* 144*  BUN 11 12  --  11 11 10   CREATININE 1.04* 0.90  --  1.02* 1.00 1.05*  CALCIUM 9.4  --   --  9.2 9.1 9.2  AST 11*  --   --   --   --   --   ALT 8  --   --   --   --   --   ALKPHOS 66  --   --   --   --   --   BILITOT 0.5  --   --   --   --   --   ALBUMIN 3.5  --   --   --   --   --   MG  --   --   --   --  2.1  --   INR 1.0  --   --   --   --   --   HGBA1C  --   --  6.6*  --   --   --     No results for input(s): LIPASE, AMYLASE in the last 168 hours.  Recent Labs  Lab 12/27/20 1749  SARSCOV2NAA NEGATIVE    ------------------------------------------------------------------------------------------------------------------ No results for input(s): CHOL, HDL, LDLCALC, TRIG, CHOLHDL, LDLDIRECT in the last 72 hours.  Lab Results  Component Value Date   HGBA1C 6.6 (H) 12/28/2020    ------------------------------------------------------------------------------------------------------------------ No results for input(s): TSH, T4TOTAL, T3FREE, THYROIDAB in the last 72 hours.  Invalid input(s): FREET3 ------------------------------------------------------------------------------------------------------------------ No results for input(s): VITAMINB12, FOLATE, FERRITIN, TIBC, IRON, RETICCTPCT in the last 72 hours.  Coagulation profile Recent Labs  Lab 12/27/20 1749  INR 1.0   No results for input(s): DDIMER in the last 72 hours.  Cardiac Enzymes No results for input(s): CKTOTAL, CKMB, CKMBINDEX, TROPONINI in the last 168 hours.  ------------------------------------------------------------------------------------------------------------------    Component Value Date/Time   BNP 96.6 06/22/2018 1715     Antibiotics: Anti-infectives (From admission, onward)   Start     Dose/Rate Route Frequency Ordered Stop   12/29/20 1230  amoxicillin-clavulanate (AUGMENTIN) 875-125 MG per tablet 1 tablet        1 tablet Oral Every 12 hours 12/29/20 1131 01/05/21 0959       Radiology Reports  MR ANGIO HEAD WO CONTRAST  Result Date: 12/30/2020 CLINICAL DATA:  Follow-up left hemisphere white matter stroke. EXAM: MRI HEAD WITHOUT CONTRAST MRA HEAD WITHOUT CONTRAST TECHNIQUE: Multiplanar, multiecho pulse sequences of the brain and surrounding structures were obtained without intravenous contrast. Angiographic images of the head were obtained using MRA technique without contrast. COMPARISON:  12/27/2020 FINDINGS: MRI HEAD FINDINGS Brain: Diffusion imaging does not show any progressive change related to 4-5 mm acute infarction of the left centrum semiovale region. Elsewhere, chronic small-vessel ischemic changes are evident throughout the hemispheric white matter. Old small vessel infarctions of the thalami. Old hemorrhagic infarction of the right basal ganglia. No change in those  findings since the study of 3 days ago. No hydrocephalus or extra-axial collection. Vascular: Major vessels at the base of the brain show flow. Skull and upper cervical spine: Negative Sinuses/Orbits: Clear/normal Other: None MRA HEAD FINDINGS Both internal carotid arteries are patent through the skull base and siphon  region. Some siphon atherosclerotic irregularity. The anterior and middle cerebral vessels are patent. No large or medium vessel occlusion. Moderate distal vessel atherosclerotic irregularity is noted. Motion artifact degrades evaluation of the vertebral arteries. Basilar artery is patent. 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 Posterior circulation branch vessels are patent. Patent bilateral posterior communicating arteries. There is atherosclerotic narrowing and irregularity of the PCA branches with proximal stenosis ease. IMPRESSION: No acute large vessel occlusion. Atherosclerotic irregularity of the distal intracranial branch vessels. 2 mm basilar tip aneurysm. No change in the 4-5 mm acute infarction of the left posterior centrum semiovale white matter. Extensive chronic ischemic changes elsewhere throughout the brain as previously seen. Electronically Signed   By: Nelson Chimes M.D.   On: 12/30/2020 08:57   MR BRAIN WO CONTRAST  Result Date: 12/30/2020 CLINICAL DATA:  Follow-up left hemisphere white matter stroke. EXAM: MRI HEAD WITHOUT CONTRAST MRA HEAD WITHOUT CONTRAST TECHNIQUE: Multiplanar, multiecho pulse sequences of the brain and surrounding structures were obtained without intravenous contrast. Angiographic images of the head were obtained using MRA technique without contrast. COMPARISON:  12/27/2020 FINDINGS: MRI HEAD FINDINGS Brain: Diffusion imaging does not show any progressive change related to 4-5 mm acute infarction of the left centrum semiovale region. Elsewhere, chronic small-vessel ischemic changes are evident throughout the hemispheric white matter. Old small vessel  infarctions of the thalami. Old hemorrhagic infarction of the right basal ganglia. No change in those findings since the study of 3 days ago. No hydrocephalus or extra-axial collection. Vascular: Major vessels at the base of the brain show flow. Skull and upper cervical spine: Negative Sinuses/Orbits: Clear/normal Other: None MRA HEAD FINDINGS Both internal carotid arteries are patent through the skull base and siphon region. Some siphon atherosclerotic irregularity. The anterior and middle cerebral vessels are patent. No large or medium vessel occlusion. Moderate distal vessel atherosclerotic irregularity is noted. Motion artifact degrades evaluation of the vertebral arteries. Basilar artery is patent. 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 Posterior circulation branch vessels are patent. Patent bilateral posterior communicating arteries. There is atherosclerotic narrowing and irregularity of the PCA branches with proximal stenosis ease. IMPRESSION: No acute large vessel occlusion. Atherosclerotic irregularity of the distal intracranial branch vessels. 2 mm basilar tip aneurysm. No change in the 4-5 mm acute infarction of the left posterior centrum semiovale white matter. Extensive chronic ischemic changes elsewhere throughout the brain as previously seen. Electronically Signed   By: Nelson Chimes M.D.   On: 12/30/2020 08:57   DG ESOPHAGUS W SINGLE CM (SOL OR THIN BA)  Result Date: 12/30/2020 CLINICAL DATA:  Soft ill dysphagia. Feels like food gets stuck in her throat and has trouble burping since her stroke last year EXAM: ESOPHOGRAM/BARIUM SWALLOW TECHNIQUE: Single contrast examination was performed using  thin barium. FLUOROSCOPY TIME:  Fluoroscopy Time:  3 minutes 12 seconds Radiation Exposure Index (if provided by the fluoroscopic device): 49.2 mGy Number of Acquired Spot Images: 7 COMPARISON:  Chest CT 12/29/2020 FINDINGS: Thoracic esophagram was performed utilizing thin barium only in the LPO position due  to patient condition. There is severe esophageal dysmotility with impaired relaxation of the lower esophageal sphincter not coordinated with esophageal contractions. There is stasis of barium in the esophagus and esophageal dilation. There is distal segment narrowing of approximately 3.5 cm. IMPRESSION: Distal esophageal narrowing with segment measuring approximately 3.0 cm, with upstream esophageal dilation, and esophageal dysmotility with contractions not in coordination with the lower esophageal sphincter. Findings are compatible with a diagnosis of  achalasia. Recommend gastroenterology referral/consultation. Electronically Signed   By: Maurine Simmering   On: 12/30/2020 11:40      DVT prophylaxis: Lovenox  Code Status: Full code  Family Communication: No family at bedside   Consultants:  Neurology  Gastroenterology  Procedures:      Objective    Physical Examination:    General: Appears in no acute distress  Cardiovascular: S1-S2, regular, no murmur auscultated  Respiratory: Clear to auscultation bilaterally  Abdomen: Abdomen is soft, nontender, no organomegaly  Extremities: No edema in the lower extremities  Neurologic: Left upper extremity contracture, left lower extremity weakness, right upper and lower extremity weakness noted.  Alert and oriented x3   Status is: Inpatient  Dispo: The patient is from: Home              Anticipated d/c is to: Skilled nursing facility              Anticipated d/c date is: 01/03/2021              Patient currently not stable for discharge  Barrier to discharge-ongoing work-up for dysphagia  COVID-19 Labs  No results for input(s): DDIMER, FERRITIN, LDH, CRP in the last 72 hours.  Lab Results  Component Value Date   [place] NEGATIVE 12/27/2020   [place] NEGATIVE 10/25/2020   [place] NEGATIVE 09/08/2020   [place] NEGATIVE 12/15/2019    Microbiology  Recent Results (from the past 240 hour(s))  Resp Panel by  RT-PCR (Flu A&B, Covid) Nasopharyngeal Swab     Status: None   Collection Time: 12/27/20  5:49 PM   Specimen: Nasopharyngeal Swab; Nasopharyngeal(NP) swabs in vial transport medium  Result Value Ref Range Status   SARS Coronavirus 2 by RT PCR NEGATIVE NEGATIVE Final    Comment: (NOTE) SARS-CoV-2 target nucleic acids are NOT DETECTED.  The SARS-CoV-2 RNA is generally detectable in upper respiratory specimens during the acute phase of infection. The lowest concentration of SARS-CoV-2 viral copies this assay can detect is 138 copies/mL. A negative result does not preclude SARS-Cov-2 infection and should not be used as the sole basis for treatment or other patient management decisions. A negative result may occur with  improper specimen collection/handling, submission of specimen other than nasopharyngeal swab, presence of viral mutation(s) within the areas targeted by this assay, and inadequate number of viral copies(<138 copies/mL). A negative result must be combined with clinical observations, patient history, and epidemiological information. The expected result is Negative.  Fact Sheet for Patients:  EntrepreneurPulse.com.au  Fact Sheet for Healthcare Providers:  IncredibleEmployment.be  This test is no t yet approved or cleared by the [place] FDA and  has been authorized for detection and/or diagnosis of SARS-CoV-2 by FDA under an Emergency Use Authorization (EUA). This EUA will remain  in effect (meaning this test can be used) for the duration of the COVID-19 declaration under Section 564(b)(1) of the Act, 21 U.S.C.section 360bbb-3(b)(1), unless the authorization is terminated  or revoked sooner.       Influenza A by PCR NEGATIVE NEGATIVE Final   Influenza B by PCR NEGATIVE NEGATIVE Final    Comment: (NOTE) The Xpert Xpress SARS-CoV-2/FLU/RSV plus assay is intended as an aid in the diagnosis of influenza from Nasopharyngeal swab  specimens and should not be used as a sole basis for treatment. Nasal washings and aspirates are unacceptable for Xpert Xpress SARS-CoV-2/FLU/RSV testing.  Fact Sheet for Patients: EntrepreneurPulse.com.au  Fact Sheet for Healthcare Providers: IncredibleEmployment.be  This test is not yet approved  or cleared by the [place] and has been authorized for detection and/or diagnosis of SARS-CoV-2 by FDA under an Emergency Use Authorization (EUA). This EUA will remain in effect (meaning this test can be used) for the duration of the COVID-19 declaration under Section 564(b)(1) of the Act, 21 U.S.C. section 360bbb-3(b)(1), unless the authorization is terminated or revoked.  Performed at [facility], [place] [address] [place],  [address]          [facility] If 7PM-7AM, please contact night-coverage at [email or web address], Office  [phone]   12/31/2020, 5:32 PM  LOS: 4 days

## 2020-12-31 NOTE — Op Note (Signed)
[facility] [name] Procedure Date : 12/31/2020 MRN: [id number] Attending MD: Ronnette Juniper , MD Date of Birth: 1970-10-05 CSN: [id number] Age: 50 Admit Type: Inpatient Procedure:                Upper GI endoscopy Indications:              Dysphagia, Abnormal cine-esophagram, Suspected                            achalasia Providers:                Ronnette Juniper, MD, Baird Cancer, RN, Ladona Ridgel,                            Technician Referring MD:             [facility], PCP Mobolaji Bakare,MD Medicines:                Monitored Anesthesia Care Complications:            No immediate complications. Estimated blood loss:                            Minimal. Estimated Blood Loss:     Estimated blood loss was minimal. Procedure:                Pre-Anesthesia Assessment:                           - Prior to the procedure, a History and Physical                            was performed, and patient medications and                            allergies were reviewed. The patient's tolerance of                            previous anesthesia was also reviewed. The risks                            and benefits of the procedure and the sedation                            options and risks were discussed with the patient.                            All questions were answered, and informed consent                            was obtained. Prior Anticoagulants: The patient has                            taken no previous anticoagulant or antiplatelet                            agents. ASA Grade Assessment:  III - A patient with                            severe systemic disease. After reviewing the risks                            and benefits, the patient was deemed in                            satisfactory condition to undergo the procedure.                           After obtaining informed consent, the endoscope was                            passed under  direct vision. Throughout the                            procedure, the patient's blood pressure, pulse, and                            oxygen saturations were monitored continuously. The                            [id number]-H190 ([id number]) Olympus gastroscope was                            introduced through the mouth, and advanced to the                            second part of duodenum. The upper GI endoscopy was                            accomplished without difficulty. The patient                            tolerated the procedure well. Scope In: Scope Out: Findings:      The lumen of the upper third of the esophagus, middle third of the       esophagus and lower third of the esophagus was severely dilated.       Residual food noted. A net was used to retrived solid food       particles(consistent with chicken pieces) form the mid and upper       esophagus.      One benign-appearing, intrinsic mild (non-circumferential scarring)       stenosis was found 38 cm from the incisors. The stenosis was traversed.       A TTS dilator was passed through the scope. Dilation with an 18-19-20 mm       x 8 cm CRE balloon dilator was performed to 20 mm. The dilation site was       examined following endoscope reinsertion and showed no change. Area was       successfully injected with 100 units botulinum toxin.      The lower third of the esophagus was significantly tortuous.  A small amount of food (residue) was found in the gastric body.      The stomach was normal.      The cardia and gastric fundus were normal on retroflexion.      The examined duodenum was normal. Impression:               - Dilation in the upper third of the esophagus, in                            the middle third of the esophagus and in the lower                            third of the esophagus.                           - Benign-appearing esophageal stenosis. Dilated.                            Injected with botulinum  toxin.                           - Tortuous esophagus.                           - A small amount of food (residue) in the stomach.                           - Normal stomach.                           - Normal examined duodenum.                           - No specimens collected. Moderate Sedation:      Patient did not receive moderate sedation for this procedure, but       instead received monitored anesthesia care. Recommendation:           - Advance diet as tolerated.                           - Patient will benefit from esophageal myotomy for                            (clinical picture and imaging consistenct with)                            achalasia. Procedure Code(s):        --- Professional ---                           [id number], Esophagogastroduodenoscopy, flexible,                            transoral; with transendoscopic balloon dilation of                            esophagus (less than 30 mm  diameter)                           43236, 59, Esophagogastroduodenoscopy, flexible,                            transoral; with directed submucosal injection(s),                            any substance Diagnosis Code(s):        --- Professional ---                           K22.8, Other specified diseases of esophagus                           K22.2, Esophageal obstruction                           Q39.9, Congenital malformation of esophagus,                            unspecified                           R13.10, Dysphagia, unspecified                           R93.3, Abnormal findings on diagnostic imaging of                            other parts of digestive tract CPT copyright 2019 [employer]. All rights reserved. The codes documented in this report are preliminary and upon coder review may  be revised to meet current compliance requirements. Ronnette Juniper, MD 12/31/2020 2:55:49 PM This report has been signed electronically. Number of Addenda: 0

## 2020-12-31 NOTE — Anesthesia Postprocedure Evaluation (Signed)
Anesthesia Post Note  Patient: Klare Criss  Procedure(s) Performed: ESOPHAGOGASTRODUODENOSCOPY (EGD) (N/A ) BOTOX INJECTION (N/A ) BALLOON DILATION (N/A ) FOREIGN BODY REMOVAL     Patient location during evaluation: Endoscopy Anesthesia Type: MAC Level of consciousness: awake and alert Pain management: pain level controlled Vital Signs Assessment: post-procedure vital signs reviewed and stable Respiratory status: spontaneous breathing, nonlabored ventilation, respiratory function stable and patient connected to nasal cannula oxygen Cardiovascular status: stable and blood pressure returned to baseline Postop Assessment: no apparent nausea or vomiting Anesthetic complications: no   No complications documented.  Last Vitals:  Vitals:   12/31/20 1533 12/31/20 2035  BP: (!) 170/99 (!) 167/95  Pulse: 81 87  Resp: 18 19  Temp: (!) 36.4 C 36.9 C  SpO2: 100% 100%    Last Pain:  Vitals:   12/31/20 2035  TempSrc: Oral  PainSc:                  Dalvin Clipper COKER

## 2020-12-31 NOTE — Progress Notes (Signed)
Physical Therapy Treatment Patient Details Name: Diamond Rice MRN: [id number] DOB: 1970/11/25 Today's Date: 12/31/2020    History of Present Illness Pt is a 50 y/o female admitted 5/2 secondary to increased difficulty ambulating and difficulty speaking. PMH includes CVA, DM2, HTN, HLD, smoking, prior cocaine abuse, PE (no longer on anticoagulation).    PT Comments    Patient supine in bed on arrival and agreeable to PT/OT session. Patient progressing with mobility and increasing ambulation distance this session. Patient is limited by muscular fatigue and requires up to minA during ambulation due to difficulty advancing L foot when fatigued. Patient is motivated to participate. Continue to recommend SNF for ongoing Physical Therapy.       Follow Up Recommendations  SNF;Supervision/Assistance - 24 hour     Equipment Recommendations  None recommended by PT    Recommendations for Other Services       Precautions / Restrictions Precautions Precautions: Fall Restrictions Weight Bearing Restrictions: No    Mobility  Bed Mobility Overal bed mobility: Needs Assistance Bed Mobility: Supine to Sit     Supine to sit: Min guard     General bed mobility comments: min guard for safety, no physical assist required    Transfers Overall transfer level: Needs assistance Equipment used: Quad cane Transfers: Sit to/from Stand Sit to Stand: Min guard;+2 safety/equipment         General transfer comment: min guard for safety. Patient with good hand placement  Ambulation/Gait Ambulation/Gait assistance: Min guard;Min assist;+2 safety/equipment Gait Distance (Feet): 30 Feet (x 10') Assistive device: Quad cane Gait Pattern/deviations: Step-through pattern;Decreased stride length;Decreased stance time - left;Wide base of support Gait velocity: decreased   General Gait Details: min guard for 30' with quad cane. Patient with difficulty advancing L foot at times. Min guard-minA for  additional 10' due to muscular fatigue of L LE.   Stairs             Wheelchair Mobility    Modified Rankin (Stroke Patients Only) Modified Rankin (Stroke Patients Only) Pre-Morbid Rankin Score: Slight disability Modified Rankin: Moderately severe disability     Balance Overall balance assessment: Needs assistance Sitting-balance support: No upper extremity supported;Feet supported Sitting balance-Leahy Scale: Fair     Standing balance support: Single extremity supported;During functional activity Standing balance-Leahy Scale: Poor Standing balance comment: Reliant on UE and external support                            Cognition Arousal/Alertness: Awake/alert Behavior During Therapy: WFL for tasks assessed/performed Overall Cognitive Status: No family/caregiver present to determine baseline cognitive functioning                                 General Comments: Increased time to process and speech. Highly motivated to work with therapies      Exercises      General Comments        Pertinent Vitals/Pain Pain Assessment: Faces Faces Pain Scale: No hurt Pain Intervention(s): Monitored during session    Home Living                      Prior Function            PT Goals (current goals can now be found in the care plan section) Acute Rehab PT Goals Patient Stated Goal: to regain independence PT Goal Formulation: With  patient Time For Goal Achievement: 01/11/21 Potential to Achieve Goals: Good Progress towards PT goals: Progressing toward goals    Frequency    Min 3X/week      PT Plan Current plan remains appropriate    Co-evaluation PT/OT/SLP Co-Evaluation/Treatment: Yes Reason for Co-Treatment: To address functional/ADL transfers PT goals addressed during session: Mobility/safety with mobility;Balance;Proper use of DME OT goals addressed during session: ADL's and self-care      AM-PAC PT "6 Clicks"  Mobility   Outcome Measure  Help needed turning from your back to your side while in a flat bed without using bedrails?: A Little Help needed moving from lying on your back to sitting on the side of a flat bed without using bedrails?: A Little Help needed moving to and from a bed to a chair (including a wheelchair)?: A Little Help needed standing up from a chair using your arms (e.g., wheelchair or bedside chair)?: A Little Help needed to walk in hospital room?: A Little Help needed climbing 3-5 steps with a railing? : A Lot 6 Click Score: 17    End of Session Equipment Utilized During Treatment: Gait belt Activity Tolerance: Patient tolerated treatment well Patient left: in chair;with call bell/phone within reach;with chair alarm set Nurse Communication: Mobility status PT Visit Diagnosis: Unsteadiness on feet (R26.81);Muscle weakness (generalized) (M62.81);Difficulty in walking, not elsewhere classified (R26.2)     Time: 2536-6440 PT Time Calculation (min) (ACUTE ONLY): 20 min  Charges:  $Gait Training: 8-22 mins                     Korry Dalgleish A. Gilford Rile PT, DPT Acute Rehabilitation Services Pager [phone] Office [phone]    Linna Hoff 12/31/2020, 5:18 PM

## 2020-12-31 NOTE — Anesthesia Preprocedure Evaluation (Addendum)
Anesthesia Evaluation  Patient identified by MRN, date of birth, ID band Patient awake    Reviewed: Allergy & Precautions, NPO status , Patient's Chart, lab work & pertinent test results  Airway Mallampati: II  TM Distance: >3 FB Neck ROM: Full    Dental  (+) Teeth Intact, Dental Advisory Given   Pulmonary sleep apnea and Continuous Positive Airway Pressure Ventilation , COPD, Patient abstained from smoking., former smoker,    breath sounds clear to auscultation       Cardiovascular hypertension, + Peripheral Vascular Disease   Rhythm:Regular Rate:Normal + Systolic murmurs    Neuro/Psych  Headaches, PSYCHIATRIC DISORDERS Anxiety Depression Bipolar Disorder Schizophrenia  Neuromuscular disease (peripheral neuropathy) CVA    GI/Hepatic   Endo/Other  diabetes  Renal/GU Renal InsufficiencyRenal disease     Musculoskeletal Chronic low back pain    Abdominal   Peds  Hematology   Anesthesia Other Findings   Reproductive/Obstetrics                            Anesthesia Physical Anesthesia Plan  ASA: III  Anesthesia Plan: MAC   Post-op Pain Management:    Induction:   PONV Risk Score and Plan: 2 and Propofol infusion, TIVA and Treatment may vary due to age or medical condition  Airway Management Planned: Natural Airway  Additional Equipment:   Intra-op Plan:   Post-operative Plan:   Informed Consent: I have reviewed the patients History and Physical, chart, labs and discussed the procedure including the risks, benefits and alternatives for the proposed anesthesia with the patient or authorized representative who has indicated his/her understanding and acceptance.     Dental advisory given  Plan Discussed with: CRNA and Anesthesiologist  Anesthesia Plan Comments: (Hypertension, BP 220/115 patient received PO Amlodipine and Metoprolol today   10 mg  labetolol given in Endo  pre-procedure  Plan MAC)       Anesthesia Quick Evaluation

## 2020-12-31 NOTE — Transfer of Care (Signed)
Immediate Anesthesia Transfer of Care Note  Patient: Diamond Rice  Procedure(s) Performed: ESOPHAGOGASTRODUODENOSCOPY (EGD) (N/A ) BOTOX INJECTION (N/A ) BALLOON DILATION (N/A ) FOREIGN BODY REMOVAL  Patient Location: Endoscopy Unit  Anesthesia Type:MAC  Level of Consciousness: awake, alert  and oriented  Airway & Oxygen Therapy: Patient Spontanous Breathing  Post-op Assessment: Report given to RN and Post -op Vital signs reviewed and stable  Post vital signs: Reviewed and stable  Last Vitals:  Vitals Value Taken Time  BP 133/102 12/31/20 1452  Temp 36.4 C 12/31/20 1452  Pulse 85 12/31/20 1453  Resp 26 12/31/20 1453  SpO2 100 % 12/31/20 1453  Vitals shown include unvalidated device data.  Last Pain:  Vitals:   12/31/20 1452  TempSrc: Temporal  PainSc:       Patients Stated Pain Goal: 0 (64/68/03 2122)  Complications: No complications documented.

## 2020-12-31 NOTE — Anesthesia Procedure Notes (Signed)
Procedure Name: MAC Date/Time: 12/31/2020 2:29 PM Performed by: Dorthea Cove, CRNA Pre-anesthesia Checklist: Patient identified, Emergency Drugs available, Suction available, Patient being monitored and Timeout performed Patient Re-evaluated:Patient Re-evaluated prior to induction Oxygen Delivery Method: Nasal cannula Preoxygenation: Pre-oxygenation with 100% oxygen Induction Type: IV induction Placement Confirmation: CO2 detector Dental Injury: Teeth and Oropharynx as per pre-operative assessment

## 2020-12-31 NOTE — Progress Notes (Signed)
SLP Cancellation Note  Patient Details Name: Diamond Rice MRN: [id number] DOB: Dec 03, 1970   Cancelled treatment:       Reason Eval/Treat Not Completed: Medical issues which prohibited therapy. Chart reviewed - note that pt is NPO pending EGD planned for today. Will f/u as able.     Osie Bond., M.A. [place] Acute Rehabilitation Services Pager [phone] Office [phone]  12/31/2020, 12:36 PM

## 2020-12-31 NOTE — Interval H&P Note (Signed)
History and Physical Interval Note: 49/female with dysphagia, barium swallow suggestive of achalasia for an EGD with Botox and possible balloon dilation.  12/31/2020 1:56 PM  Diamond Rice  has presented today for EGD with possible balloon dilation and Botox injection, with the diagnosis of dysphagia, abnormal barium esophagram.  The various methods of treatment have been discussed with the patient and family. After consideration of risks, benefits and other options for treatment, the patient has consented to  Procedure(s) with comments: ESOPHAGOGASTRODUODENOSCOPY (EGD) (N/A) - balloon dilation, botox BOTOX INJECTION (N/A) BALLOON DILATION (N/A) as a surgical intervention.  The patient's history has been reviewed, patient examined, no change in status, stable for surgery.  I have reviewed the patient's chart and labs.  Questions were answered to the patient's satisfaction.     Ronnette Juniper

## 2021-01-01 DIAGNOSIS — I10 Essential (primary) hypertension: Secondary | ICD-10-CM | POA: Diagnosis not present

## 2021-01-01 DIAGNOSIS — R918 Other nonspecific abnormal finding of lung field: Secondary | ICD-10-CM | POA: Diagnosis not present

## 2021-01-01 DIAGNOSIS — R1310 Dysphagia, unspecified: Secondary | ICD-10-CM | POA: Diagnosis not present

## 2021-01-01 DIAGNOSIS — R911 Solitary pulmonary nodule: Secondary | ICD-10-CM | POA: Diagnosis not present

## 2021-01-01 LAB — GLUCOSE, CAPILLARY
Glucose-Capillary: 135 mg/dL — ABNORMAL HIGH (ref 70–99)
Glucose-Capillary: 194 mg/dL — ABNORMAL HIGH (ref 70–99)
Glucose-Capillary: 133 mg/dL — ABNORMAL HIGH (ref 70–99)
Glucose-Capillary: 157 mg/dL — ABNORMAL HIGH (ref 70–99)

## 2021-01-01 LAB — SARS CORONAVIRUS 2 (TAT 6-24 HRS): SARS Coronavirus 2: NEGATIVE

## 2021-01-01 MED ORDER — GLUCAGON HCL RDNA (DIAGNOSTIC) 1 MG IJ SOLR
1.0000 mg | Freq: Once | INTRAMUSCULAR | Status: AC
  Administered 2021-01-01: 1 mg via INTRAVENOUS
  Filled 2021-01-01: qty 1

## 2021-01-01 NOTE — TOC Progression Note (Signed)
Transition of Care [facility]) - Progression Note    Patient Details  Name: Diamond Rice MRN: [id number] Date of Birth: [birth year]  [facility]) CM/SW [place], [place] Phone Number: 01/01/2021, 2:41 PM  Clinical Narrative:    CSW followed up with pt who noted she would like to go to [facility]. CSW updated facility and they stated they could take her Monday morning. Pt will need to take her covid card when discharging. SW will continue top follow for DC on Monday.   Expected Discharge Plan: [place] Barriers to Discharge: Continued Medical Work up  Expected Discharge Plan and Services Expected Discharge Plan: [place] Choice: [place] arrangements for the past 2 months: Apartment                                       Social Determinants of Health (SDOH) Interventions    Readmission Risk Interventions No flowsheet data found.

## 2021-01-01 NOTE — Progress Notes (Signed)
[facility]  PROGRESS NOTE  Diamond Rice MWN:[id number] DOB: 26-Nov-1970 DOA: 12/27/2020 PCP: [name], MD   Brief HPI:   50 year old female with medical history of diabetes mellitus type 2, hypertension, hyperlipidemia, tobacco abuse, prior cocaine abuse, PE, no longer on anticoagulation, medication nonadherence.  She has history of 4 prior strokes, 3 ischemic nonhemorrhagic.  She has chronic left-sided deficits from hemorrhagic stroke.  Most recent stroke was ischemic in April 2021.  Came to ED with worsening bilateral leg pain and dysarthria.  Patient was noted to be hypertensive.  She was admitted for acute CVA, patient with slurred speech and left-sided weakness.  Stroke work-up completed.  Since harmlessly not appropriate for CIR, awaiting skilled nursing facility.  Also has a history of PAD, COPD, CKD stage II.  Course complicated by recurrent strokelike symptoms on 12/30/2020, code stroke was activated, neurology follow-up, repeat MRI showed no acute findings.    Subjective   Patient seen and examined, underwent EGD yesterday, with balloon dilatation and Botox injection.   Assessment/Plan:     1. Acute ischemic stroke-patient presents with dysarthria and admitted to walk.  Neurology was consulted, patient was found to have CR infarct likely from small vessel disease due to uncontrolled risk factors including hypertension hyperlipidemia.  CT head showed no acute large vessel occlusion.  Chronic small vessel ischemic changes throughout the brain, progressive since April 2021.  CT head and neck showed no large or medium vessel occlusion.  MRI showed 4 to 5 mm acute infarction of left hemispheric white matter in the upper parietal centrum semiovale region.  2D echo showed EF 65%, LDL 136, A1c 6.6.  UDS positive for THC.  Continue statin.  PT/OT recommended CIR, patient reportedly homeless which makes disposition difficult.  Cannot go to CIR.  Insurance authorization pending for  skilled nursing facility.  As per rehab MD recommendation, needs outpatient referral to PMR for Botox of left upper extremity to help with spasticity.  As per neurology patient is on aspirin 81 mg daily prior to admission now on aspirin 81 mg daily continue same regimen at discharge.  No DAPT given history of ICH and uncontrolled hypertension.  On morning of 5/5 patient had recurrence of strokelike symptoms, code stroke was activated.  Neurology follow-up.  Repeat MRI without acute change, still has evolving left CR small infarct, chronic right basal ganglia MCH.  Discussed with Dr. Erlinda Hong, who felt there was recrudescence of old stroke in the setting of anxiety and depression versus conversion disorder.  He recommended continuing current management and consider antidepressant if needed.  Patient has clinically improved.    2. Abnormal finding on lung imaging/suspected chronic aspiration pneumonia-CT of the neck showed 2.8 cm solid lesion in the lateral aspect of right upper lobe.  CT chest with contrast on 5/4 showed scattered mixed groundglass and tree-in-bud nodular opacity predominantly which appear confined to right upper lobe.  Appearance favors an infectious or inflammatory process including possible sequelae of aspiration given a chronic patulous and distended thoracic esophagus containing admixed air with ingested material to the level of thoracic inlet which is similar in appearance compared to CT from 2019.  Recommend follow-up noncontrast CT imaging to ensure resolution after therapeutic trial.  No other acute process or concerning pulmonary nodules or masses.  Patient does report history of what appears to be aspiration/choking episodes when attempting to burp.  Started on oral Augmentin for 1 week.  Consider repeat CT chest in 4 weeks.  The case was  reviewed with PCCM on 12/29/2020 by Dr. Algis Liming who agreed with the management.  3. Dysphagia-suspect related to stroke.  EGD on 10/27/2020 by [facility] showed  normal esophagus.  Speech therapy was consulted for swallow evaluation dietary recommendations.  They recommended barium swallow.  Barium swallow was abnormal, suggestive of achalasia.  Gastroenterology was consulted.  Plan for EGD with Botox injection today.  4. Hypertension-allowing permissive hypertension given acute CVA and gradually normalize in 3 to 5 days.  Home medications currently on hold.  She has been markedly hypertensive on 12/29/2020 with BP as high as 221/102.  Patient's home medication including Toprol-XL and amlodipine have been resumed.  Blood pressure is still elevated, continue lisinopril, hydralazine at prior home dose.  5. Polysubstance abuse-toxicology was positive for THC.  Cessation counseling provided.  6. Hyperlipidemia-lipid panel shows total cholesterol 209, HDL 60, LDL 136, triglyceride 63.  Continue statin.  7. Hypokalemia-replete, today potassium is 3.6.   Scheduled medications:   . amLODipine  10 mg Oral Daily  . amoxicillin-clavulanate  1 tablet Oral Q12H  . ARIPiprazole  5 mg Oral Daily  . aspirin  81 mg Oral Daily  . atorvastatin  80 mg Oral q1800  . chlorhexidine  15 mL Mouth Rinse BID  . enoxaparin (LOVENOX) injection  40 mg Subcutaneous Q24H  . hydrALAZINE  100 mg Oral Q8H  . insulin aspart  0-9 Units Subcutaneous TID WC  . lisinopril  40 mg Oral Daily  . mouth rinse  15 mL Mouth Rinse q12n4p  . metoprolol succinate  50 mg Oral q morning  . mirtazapine  15 mg Oral QHS         Data Reviewed:   CBG:  Recent Labs  Lab 12/31/20 1131 12/31/20 1531 12/31/20 2131 01/01/21 0622 01/01/21 1132  GLUCAP 78 100* 257* 135* 194*    SpO2: 98 %    Vitals:   01/01/21 0014 01/01/21 0337 01/01/21 0814 01/01/21 1134  BP: (!) 161/84 (!) 143/84 (!) 167/84 (!) 157/95  Pulse: 82 86 74 76  Resp: 19 18 18 18   Temp: 98 F (36.7 C) 98.1 F (36.7 C) 98.3 F (36.8 C) 98.6 F (37 C)  TempSrc: Oral Oral Oral Oral  SpO2: 98% 100% 100% 98%  Weight:       Height:         Intake/Output Summary (Last 24 hours) at 01/01/2021 1429 Last data filed at 12/31/2020 2358 Gross per 24 hour  Intake 640 ml  Output 600 ml  Net 40 ml    05/05 1901 - 05/07 0700 In: 640 [P.O.:240; I.V.:400] Out: 1000 [Urine:1000]  Filed Weights   12/28/20 0839 12/31/20 1343  Weight: 77 kg 77 kg    CBC:  Recent Labs  Lab 12/27/20 1749 12/27/20 1755 12/29/20 0230  WBC 6.9  --  6.5  HGB 12.5 13.9 12.3  HCT 39.8 41.0 39.0  PLT 264  --  257  MCV 73.8*  --  73.0*  MCH 23.2*  --  23.0*  MCHC 31.4  --  31.5  RDW 16.2*  --  16.0*  LYMPHSABS 2.8  --   --   MONOABS 0.7  --   --   EOSABS 0.2  --   --   BASOSABS 0.1  --   --     Complete metabolic panel:  Recent Labs  Lab 12/27/20 1749 12/27/20 1755 12/28/20 0349 12/29/20 0230 12/30/20 0406 12/31/20 0354  NA 138 140  --  138 137 138  K 3.5 3.5  --  3.2* 3.1* 3.6  CL 104 103  --  104 103 106  CO2 25  --   --  28 24 23   GLUCOSE 143* 143*  --  126* 135* 144*  BUN 11 12  --  11 11 10   CREATININE 1.04* 0.90  --  1.02* 1.00 1.05*  CALCIUM 9.4  --   --  9.2 9.1 9.2  AST 11*  --   --   --   --   --   ALT 8  --   --   --   --   --   ALKPHOS 66  --   --   --   --   --   BILITOT 0.5  --   --   --   --   --   ALBUMIN 3.5  --   --   --   --   --   MG  --   --   --   --  2.1  --   INR 1.0  --   --   --   --   --   HGBA1C  --   --  6.6*  --   --   --     No results for input(s): LIPASE, AMYLASE in the last 168 hours.  Recent Labs  Lab 12/27/20 1749  SARSCOV2NAA NEGATIVE    ------------------------------------------------------------------------------------------------------------------ No results for input(s): CHOL, HDL, LDLCALC, TRIG, CHOLHDL, LDLDIRECT in the last 72 hours.  Lab Results  Component Value Date   HGBA1C 6.6 (H) 12/28/2020   ------------------------------------------------------------------------------------------------------------------ No results for input(s): TSH, T4TOTAL,  T3FREE, THYROIDAB in the last 72 hours.  Invalid input(s): FREET3 ------------------------------------------------------------------------------------------------------------------ No results for input(s): VITAMINB12, FOLATE, FERRITIN, TIBC, IRON, RETICCTPCT in the last 72 hours.  Coagulation profile Recent Labs  Lab 12/27/20 1749  INR 1.0   No results for input(s): DDIMER in the last 72 hours.  Cardiac Enzymes No results for input(s): CKTOTAL, CKMB, CKMBINDEX, TROPONINI in the last 168 hours.  ------------------------------------------------------------------------------------------------------------------    Component Value Date/Time   BNP 96.6 06/22/2018 1715     Antibiotics: Anti-infectives (From admission, onward)   Start     Dose/Rate Route Frequency Ordered Stop   12/29/20 1230  amoxicillin-clavulanate (AUGMENTIN) 875-125 MG per tablet 1 tablet        1 tablet Oral Every 12 hours 12/29/20 1131 01/05/21 0959       Radiology Reports  No results found.    DVT prophylaxis: Lovenox  Code Status: Full code  Family Communication: No family at bedside   Consultants:  Neurology  Gastroenterology  Procedures:      Objective    Physical Examination:    General-appears in no acute distress  Heart-S1-S2, regular, no murmur auscultated  Lungs-clear to auscultation bilaterally, no wheezing or crackles auscultated  Abdomen-soft, nontender, no organomegaly  Extremities-no edema in the lower extremities  Neuro-alert, oriented x3, no focal deficit noted   Status is: Inpatient  Dispo: The patient is from: Home              Anticipated d/c is to: Skilled nursing facility              Anticipated d/c date is: 01/03/2021              Patient currently not stable for discharge  Barrier to discharge-ongoing work-up for dysphagia  COVID-19 Labs  No results for input(s): DDIMER, FERRITIN, LDH, CRP in the last  72 hours.  Lab Results  Component  Value Date   [place] NEGATIVE 12/27/2020   [place] NEGATIVE 10/25/2020   [place] NEGATIVE 09/08/2020   [place] NEGATIVE 12/15/2019    Microbiology  Recent Results (from the past 240 hour(s))  Resp Panel by RT-PCR (Flu A&B, Covid) Nasopharyngeal Swab     Status: None   Collection Time: 12/27/20  5:49 PM   Specimen: Nasopharyngeal Swab; Nasopharyngeal(NP) swabs in vial transport medium  Result Value Ref Range Status   SARS Coronavirus 2 by RT PCR NEGATIVE NEGATIVE Final    Comment: (NOTE) SARS-CoV-2 target nucleic acids are NOT DETECTED.  The SARS-CoV-2 RNA is generally detectable in upper respiratory specimens during the acute phase of infection. The lowest concentration of SARS-CoV-2 viral copies this assay can detect is 138 copies/mL. A negative result does not preclude SARS-Cov-2 infection and should not be used as the sole basis for treatment or other patient management decisions. A negative result may occur with  improper specimen collection/handling, submission of specimen other than nasopharyngeal swab, presence of viral mutation(s) within the areas targeted by this assay, and inadequate number of viral copies(<138 copies/mL). A negative result must be combined with clinical observations, patient history, and epidemiological information. The expected result is Negative.  Fact Sheet for Patients:  EntrepreneurPulse.com.au  Fact Sheet for Healthcare Providers:  IncredibleEmployment.be  This test is no t yet approved or cleared by the [place] FDA and  has been authorized for detection and/or diagnosis of SARS-CoV-2 by FDA under an Emergency Use Authorization (EUA). This EUA will remain  in effect (meaning this test can be used) for the duration of the COVID-19 declaration under Section 564(b)(1) of the Act, 21 U.S.C.section 360bbb-3(b)(1), unless the authorization is terminated  or revoked sooner.        Influenza A by PCR NEGATIVE NEGATIVE Final   Influenza B by PCR NEGATIVE NEGATIVE Final    Comment: (NOTE) The Xpert Xpress SARS-CoV-2/FLU/RSV plus assay is intended as an aid in the diagnosis of influenza from Nasopharyngeal swab specimens and should not be used as a sole basis for treatment. Nasal washings and aspirates are unacceptable for Xpert Xpress SARS-CoV-2/FLU/RSV testing.  Fact Sheet for Patients: EntrepreneurPulse.com.au  Fact Sheet for Healthcare Providers: IncredibleEmployment.be  This test is not yet approved or cleared by the [place] FDA and has been authorized for detection and/or diagnosis of SARS-CoV-2 by FDA under an Emergency Use Authorization (EUA). This EUA will remain in effect (meaning this test can be used) for the duration of the COVID-19 declaration under Section 564(b)(1) of the Act, 21 U.S.C. section 360bbb-3(b)(1), unless the authorization is terminated or revoked.  Performed at [facility], [place] [address], [place], [place] [address]          [facility] If 7PM-7AM, please contact night-coverage at [email or web address], Office  [phone]   01/01/2021, 2:29 PM  LOS: 5 days

## 2021-01-01 NOTE — Progress Notes (Addendum)
Called by RN that patient felt that something was stuck in her food pipe after she ate her dinner.  Patient is a soft diet but got a piece of beef today. Went to see patient, she feels that something is stuck in her chest.  She recently had esophageal dilatation with Botox injection by GI Dr. Megan Salon.  Called and discussed with GI, Dr. Megan Salon.  She recommended to give 1 dose of glucagon IV x1.  Also give her soda to drink and see if she keeps it down.  If patient vomits it out then she will need an EGD.  Plan 1 dose of glucagon IV x1. Will give patient soda to drink Change diet to full liquid Continue to monitor. Call GI, Dr. Megan Salon, for possible EGD if patient vomits.

## 2021-01-01 NOTE — Progress Notes (Signed)
At 5:03 patient called this nurse into room complaining of food being stuck in her chest. Patient stated " I ate a piece of beef and it's stuck again, I feel like the other day when my food got stuck and I had to do the Botox procedure." Aspiration precaution maintained, airway clear and no CPR needed. Doctor notified right away and new orders given. X1 Glucagon given as ordered and patient is sitting upright in bed drinking soda with no difficulties swallowing noted. Will continue to monitor.

## 2021-01-02 DIAGNOSIS — I10 Essential (primary) hypertension: Secondary | ICD-10-CM | POA: Diagnosis not present

## 2021-01-02 DIAGNOSIS — E1151 Type 2 diabetes mellitus with diabetic peripheral angiopathy without gangrene: Secondary | ICD-10-CM | POA: Diagnosis not present

## 2021-01-02 LAB — GLUCOSE, CAPILLARY
Glucose-Capillary: 108 mg/dL — ABNORMAL HIGH (ref 70–99)
Glucose-Capillary: 130 mg/dL — ABNORMAL HIGH (ref 70–99)
Glucose-Capillary: 152 mg/dL — ABNORMAL HIGH (ref 70–99)
Glucose-Capillary: 157 mg/dL — ABNORMAL HIGH (ref 70–99)
Glucose-Capillary: 152 mg/dL — ABNORMAL HIGH (ref 70–99)

## 2021-01-02 MED ORDER — CLONIDINE HCL 0.1 MG PO TABS
0.1000 mg | ORAL_TABLET | Freq: Two times a day (BID) | ORAL | Status: DC
  Administered 2021-01-02 – 2021-01-03 (×3): 0.1 mg via ORAL
  Filled 2021-01-02 (×3): qty 1

## 2021-01-02 NOTE — Progress Notes (Signed)
[facility]  PROGRESS NOTE  Diamond Rice [id number] DOB: 04-01-1971 DOA: 12/27/2020 PCP: [name], MD   Brief HPI:   50 year old female with medical history of diabetes mellitus type 2, hypertension, hyperlipidemia, tobacco abuse, prior cocaine abuse, PE, no longer on anticoagulation, medication nonadherence.  She has history of 4 prior strokes, 3 ischemic nonhemorrhagic.  She has chronic left-sided deficits from hemorrhagic stroke.  Most recent stroke was ischemic in April 2021.  Came to ED with worsening bilateral leg pain and dysarthria.  Patient was noted to be hypertensive.  She was admitted for acute CVA, patient with slurred speech and left-sided weakness.  Stroke work-up completed.  Since harmlessly not appropriate for CIR, awaiting skilled nursing facility.  Also has a history of PAD, COPD, CKD stage II.  Course complicated by recurrent strokelike symptoms on 12/30/2020, code stroke was activated, neurology follow-up, repeat MRI showed no acute findings.  Patient has ongoing dysphagia, speech therapy saw the patient and barium swallow was done which was negative of achalasia.  Patient underwent EGD with Botox injection and dilation of esophagus.  Food was removed from esophagus.  [facility] recommended outpatient myotomy for achalasia.  Dr. Paulita Fujita to follow as outpatient.   Subjective   Patient seen and examined, she had an episode of dysphagia with piece of beef stuck in the esophagus last night.  She was given 1 dose of glucagon 1 mg IV x1 and carbonated drink as per GI recommendation.  Patient was able to keep her food down.  Diet was changed to full liquid diet.  No further episodes noted.   Assessment/Plan:     1. Acute ischemic stroke-patient presents with dysarthria and admitted to walk.  Neurology was consulted, patient was found to have CR infarct likely from small vessel disease due to uncontrolled risk factors including hypertension hyperlipidemia.  CT head  showed no acute large vessel occlusion.  Chronic small vessel ischemic changes throughout the brain, progressive since April 2021.  CT head and neck showed no large or medium vessel occlusion.  MRI showed 4 to 5 mm acute infarction of left hemispheric white matter in the upper parietal centrum semiovale region.  2D echo showed EF 65%, LDL 136, A1c 6.6.  UDS positive for THC.  Continue statin.  PT/OT recommended CIR, patient reportedly homeless which makes disposition difficult.  Cannot go to CIR.  Insurance authorization pending for skilled nursing facility.  As per rehab MD recommendation, needs outpatient referral to PMR for Botox of left upper extremity to help with spasticity.  As per neurology patient is on aspirin 81 mg daily prior to admission now on aspirin 81 mg daily continue same regimen at discharge.  No DAPT given history of ICH and uncontrolled hypertension.  On morning of 5/5 patient had recurrence of strokelike symptoms, code stroke was activated.  Neurology follow-up.  Repeat MRI without acute change, still has evolving left CR small infarct, chronic right basal ganglia MCH.  Discussed with Dr. Erlinda Hong, who felt there was recrudescence of old stroke in the setting of anxiety and depression versus conversion disorder.  He recommended continuing current management and consider antidepressant if needed.  Patient has clinically improved.    2. Abnormal finding on lung imaging/suspected chronic aspiration pneumonia-CT of the neck showed 2.8 cm solid lesion in the lateral aspect of right upper lobe.  CT chest with contrast on 5/4 showed scattered mixed groundglass and tree-in-bud nodular opacity predominantly which appear confined to right upper lobe.  Appearance favors  an infectious or inflammatory process including possible sequelae of aspiration given a chronic patulous and distended thoracic esophagus containing admixed air with ingested material to the level of thoracic inlet which is similar in  appearance compared to CT from 2019.  Recommend follow-up noncontrast CT imaging to ensure resolution after therapeutic trial.  No other acute process or concerning pulmonary nodules or masses.  Patient does report history of what appears to be aspiration/choking episodes when attempting to burp.  Started on oral Augmentin for 1 week, started on 12/29/2020.  Consider repeat CT chest in 4 weeks.  The case was reviewed with PCCM on 12/29/2020 by Dr. Algis Liming who agreed with the management.  3. Dysphagia-suspect related to stroke.  EGD on 10/27/2020 by [facility] showed normal esophagus.  Speech therapy was consulted for swallow evaluation, dietary recommendations.  They recommended barium swallow.  Barium swallow was abnormal, suggestive of achalasia.  [facility] gastroenterology was consulted.  Patient underwent EGD which showed retained food which was removed, also noted to have achalasia.  Underwent dilation of esophagus along with Botox injection.  She will benefit from outpatient myotomy per Dr. Paulita Fujita as outpatient.  Diet changed to full liquid diet.  4. Diabetes mellitus type 2-continue sliding scale insulin with NovoLog.  CBG well controlled.  5. Hypertension-patient's blood pressure medications were held for allowing permissive hypertension given acute CVA and gradually normalize in 3 to 5 days.  Home medications have been restarted however she still has markedly elevated blood pressure.  Continue home medication including Toprol-XL, amlodipine, lisinopril, hydralazine.  Blood pressure is still elevated.  Patient  was taking Catapres 0.3 mg p.o. twice daily at home.  Will restart Catapres at the lower dose of 0.1 mg p.o. twice daily.    6. Polysubstance abuse-toxicology was positive for THC.  Cessation counseling provided.  7. Hyperlipidemia-lipid panel shows total cholesterol 209, HDL 60, LDL 136, triglyceride 63.  Continue statin.  8. Hypokalemia-replete   Scheduled medications:   . amLODipine  10 mg  Oral Daily  . amoxicillin-clavulanate  1 tablet Oral Q12H  . ARIPiprazole  5 mg Oral Daily  . aspirin  81 mg Oral Daily  . atorvastatin  80 mg Oral q1800  . chlorhexidine  15 mL Mouth Rinse BID  . enoxaparin (LOVENOX) injection  40 mg Subcutaneous Q24H  . hydrALAZINE  100 mg Oral Q8H  . insulin aspart  0-9 Units Subcutaneous TID WC  . lisinopril  40 mg Oral Daily  . mouth rinse  15 mL Mouth Rinse q12n4p  . metoprolol succinate  50 mg Oral q morning  . mirtazapine  15 mg Oral QHS         Data Reviewed:   CBG:  Recent Labs  Lab 01/01/21 0622 01/01/21 1132 01/01/21 1604 01/01/21 2102 01/02/21 0605  GLUCAP 135* 194* 133* 157* 108*    SpO2: 100 %    Vitals:   01/01/21 2318 01/02/21 0320 01/02/21 0737 01/02/21 0741  BP: (!) 200/101 (!) 173/102 (!) 193/105 (!) 168/99  Pulse: 78 71 75   Resp: 18 16 18    Temp: 98.4 F (36.9 C) 98.2 F (36.8 C) 98.3 F (36.8 C)   TempSrc: Oral Oral Oral   SpO2: 98% 100% 100%   Weight:      Height:         Intake/Output Summary (Last 24 hours) at 01/02/2021 1023 Last data filed at 01/02/2021 0900 Gross per 24 hour  Intake 480 ml  Output 750 ml  Net -270  ml    05/06 1901 - 05/08 0700 In: 720 [P.O.:720] Out: 1350 [Urine:1350]  Filed Weights   12/28/20 0839 12/31/20 1343  Weight: 77 kg 77 kg    CBC:  Recent Labs  Lab 12/27/20 1749 12/27/20 1755 12/29/20 0230  WBC 6.9  --  6.5  HGB 12.5 13.9 12.3  HCT 39.8 41.0 39.0  PLT 264  --  257  MCV 73.8*  --  73.0*  MCH 23.2*  --  23.0*  MCHC 31.4  --  31.5  RDW 16.2*  --  16.0*  LYMPHSABS 2.8  --   --   MONOABS 0.7  --   --   EOSABS 0.2  --   --   BASOSABS 0.1  --   --     Complete metabolic panel:  Recent Labs  Lab 12/27/20 1749 12/27/20 1755 12/28/20 0349 12/29/20 0230 12/30/20 0406 12/31/20 0354  NA 138 140  --  138 137 138  K 3.5 3.5  --  3.2* 3.1* 3.6  CL 104 103  --  104 103 106  CO2 25  --   --  28 24 23   GLUCOSE 143* 143*  --  126* 135* 144*  BUN 11  12  --  11 11 10   CREATININE 1.04* 0.90  --  1.02* 1.00 1.05*  CALCIUM 9.4  --   --  9.2 9.1 9.2  AST 11*  --   --   --   --   --   ALT 8  --   --   --   --   --   ALKPHOS 66  --   --   --   --   --   BILITOT 0.5  --   --   --   --   --   ALBUMIN 3.5  --   --   --   --   --   MG  --   --   --   --  2.1  --   INR 1.0  --   --   --   --   --   HGBA1C  --   --  6.6*  --   --   --     No results for input(s): LIPASE, AMYLASE in the last 168 hours.  Recent Labs  Lab 12/27/20 1749 01/01/21 1333  SARSCOV2NAA NEGATIVE NEGATIVE    ------------------------------------------------------------------------------------------------------------------ No results for input(s): CHOL, HDL, LDLCALC, TRIG, CHOLHDL, LDLDIRECT in the last 72 hours.  Lab Results  Component Value Date   HGBA1C 6.6 (H) 12/28/2020   ------------------------------------------------------------------------------------------------------------------ No results for input(s): TSH, T4TOTAL, T3FREE, THYROIDAB in the last 72 hours.  Invalid input(s): FREET3 ------------------------------------------------------------------------------------------------------------------ No results for input(s): VITAMINB12, FOLATE, FERRITIN, TIBC, IRON, RETICCTPCT in the last 72 hours.  Coagulation profile Recent Labs  Lab 12/27/20 1749  INR 1.0   No results for input(s): DDIMER in the last 72 hours.  Cardiac Enzymes No results for input(s): CKTOTAL, CKMB, CKMBINDEX, TROPONINI in the last 168 hours.  ------------------------------------------------------------------------------------------------------------------    Component Value Date/Time   BNP 96.6 06/22/2018 1715     Antibiotics: Anti-infectives (From admission, onward)   Start     Dose/Rate Route Frequency Ordered Stop   12/29/20 1230  amoxicillin-clavulanate (AUGMENTIN) 875-125 MG per tablet 1 tablet        1 tablet Oral Every 12 hours 12/29/20 1131 01/05/21 0959        Radiology Reports  No results found.  DVT prophylaxis: Lovenox  Code Status: Full code  Family Communication: No family at bedside   Consultants:  Neurology  Gastroenterology  Procedures:      Objective    Physical Examination:    General-appears in no acute distress  Heart-S1-S2, regular, no murmur auscultated  Lungs-clear to auscultation bilaterally, no wheezing or crackles auscultated  Abdomen-soft, nontender, no organomegaly  Extremities-no edema in the lower extremities  Neuro-alert, oriented x3, left upper extremity contracture, left lower extremity weakness, mild weakness of right upper and lower extremities noted   Status is: Inpatient  Dispo: The patient is from: Home              Anticipated d/c is to: Skilled nursing facility              Anticipated d/c date is: 01/03/2021              Patient currently stable for discharge  Barrier to discharge-awaiting bed at skilled nursing facility  COVID-19 Labs  No results for input(s): DDIMER, FERRITIN, LDH, CRP in the last 72 hours.  Lab Results  Component Value Date   [place] NEGATIVE 01/01/2021   [place] NEGATIVE 12/27/2020   [place] NEGATIVE 10/25/2020   [place] NEGATIVE 09/08/2020    Microbiology  Recent Results (from the past 240 hour(s))  Resp Panel by RT-PCR (Flu A&B, Covid) Nasopharyngeal Swab     Status: None   Collection Time: 12/27/20  5:49 PM   Specimen: Nasopharyngeal Swab; Nasopharyngeal(NP) swabs in vial transport medium  Result Value Ref Range Status   SARS Coronavirus 2 by RT PCR NEGATIVE NEGATIVE Final    Comment: (NOTE) SARS-CoV-2 target nucleic acids are NOT DETECTED.  The SARS-CoV-2 RNA is generally detectable in upper respiratory specimens during the acute phase of infection. The lowest concentration of SARS-CoV-2 viral copies this assay can detect is 138 copies/mL. A negative result does not preclude SARS-Cov-2 infection and should not  be used as the sole basis for treatment or other patient management decisions. A negative result may occur with  improper specimen collection/handling, submission of specimen other than nasopharyngeal swab, presence of viral mutation(s) within the areas targeted by this assay, and inadequate number of viral copies(<138 copies/mL). A negative result must be combined with clinical observations, patient history, and epidemiological information. The expected result is Negative.  Fact Sheet for Patients:  EntrepreneurPulse.com.au  Fact Sheet for Healthcare Providers:  IncredibleEmployment.be  This test is no t yet approved or cleared by the [place] FDA and  has been authorized for detection and/or diagnosis of SARS-CoV-2 by FDA under an Emergency Use Authorization (EUA). This EUA will remain  in effect (meaning this test can be used) for the duration of the COVID-19 declaration under Section 564(b)(1) of the Act, 21 U.S.C.section 360bbb-3(b)(1), unless the authorization is terminated  or revoked sooner.       Influenza A by PCR NEGATIVE NEGATIVE Final   Influenza B by PCR NEGATIVE NEGATIVE Final    Comment: (NOTE) The Xpert Xpress SARS-CoV-2/FLU/RSV plus assay is intended as an aid in the diagnosis of influenza from Nasopharyngeal swab specimens and should not be used as a sole basis for treatment. Nasal washings and aspirates are unacceptable for Xpert Xpress SARS-CoV-2/FLU/RSV testing.  Fact Sheet for Patients: EntrepreneurPulse.com.au  Fact Sheet for Healthcare Providers: IncredibleEmployment.be  This test is not yet approved or cleared by the [place] FDA and has been authorized for detection and/or diagnosis of SARS-CoV-2 by FDA under an Emergency Use Authorization (EUA).  This EUA will remain in effect (meaning this test can be used) for the duration of the COVID-19 declaration under Section  564(b)(1) of the Act, 21 U.S.C. section 360bbb-3(b)(1), unless the authorization is terminated or revoked.  Performed at [facility], [place] [address], [place], [place] [address]   SARS CORONAVIRUS 2 (TAT 6-24 HRS) Nasopharyngeal Nasopharyngeal Swab     Status: None   Collection Time: 01/01/21  1:33 PM   Specimen: Nasopharyngeal Swab  Result Value Ref Range Status   SARS Coronavirus 2 NEGATIVE NEGATIVE Final    Comment: (NOTE) SARS-CoV-2 target nucleic acids are NOT DETECTED.  The SARS-CoV-2 RNA is generally detectable in upper and lower respiratory specimens during the acute phase of infection. Negative results do not preclude SARS-CoV-2 infection, do not rule out co-infections with other pathogens, and should not be used as the sole basis for treatment or other patient management decisions. Negative results must be combined with clinical observations, patient history, and epidemiological information. The expected result is Negative.  Fact Sheet for Patients: SugarRoll.be  Fact Sheet for Healthcare Providers: https://www.woods-mathews.com/  This test is not yet approved or cleared by the [place] FDA and  has been authorized for detection and/or diagnosis of SARS-CoV-2 by FDA under an Emergency Use Authorization (EUA). This EUA will remain  in effect (meaning this test can be used) for the duration of the COVID-19 declaration under Se ction 564(b)(1) of the Act, 21 U.S.C. section 360bbb-3(b)(1), unless the authorization is terminated or revoked sooner.  Performed at [facility], [place] [address], [place], [place] [address]          [facility] If 7PM-7AM, please contact night-coverage at [email or web address], Office  [phone]   01/02/2021, 10:23 AM  LOS: 6 days

## 2021-01-03 ENCOUNTER — Encounter (HOSPITAL_COMMUNITY): Payer: Self-pay | Admitting: Gastroenterology

## 2021-01-03 DIAGNOSIS — R1310 Dysphagia, unspecified: Secondary | ICD-10-CM | POA: Diagnosis not present

## 2021-01-03 LAB — GLUCOSE, CAPILLARY
Glucose-Capillary: 133 mg/dL — ABNORMAL HIGH (ref 70–99)
Glucose-Capillary: 171 mg/dL — ABNORMAL HIGH (ref 70–99)

## 2021-01-03 MED ORDER — PANTOPRAZOLE SODIUM 40 MG PO TBEC
40.0000 mg | DELAYED_RELEASE_TABLET | Freq: Every day | ORAL | Status: DC
  Administered 2021-01-03: 40 mg via ORAL
  Filled 2021-01-03: qty 1

## 2021-01-03 MED ORDER — ATORVASTATIN CALCIUM 40 MG PO TABS: 80.0000 mg | ORAL_TABLET | Freq: Every day | ORAL | Status: DC

## 2021-01-03 MED ORDER — PANTOPRAZOLE SODIUM 40 MG PO TBEC: 40.0000 mg | DELAYED_RELEASE_TABLET | Freq: Every day | ORAL | Status: DC

## 2021-01-03 MED ORDER — CLONIDINE HCL 0.1 MG PO TABS
0.1000 mg | ORAL_TABLET | Freq: Two times a day (BID) | ORAL | Status: DC
Start: 2021-01-03 — End: 2021-01-17

## 2021-01-03 MED ORDER — AMOXICILLIN-POT CLAVULANATE 875-125 MG PO TABS: 1.0000 | ORAL_TABLET | Freq: Two times a day (BID) | ORAL | Status: DC

## 2021-01-03 MED ORDER — INSULIN ASPART 100 UNIT/ML IJ SOLN: 0.0000 [IU] | Freq: Three times a day (TID) | INTRAMUSCULAR | Status: DC

## 2021-01-03 NOTE — Progress Notes (Signed)
Patient is ready for discharge. IV and telemetry removed. Discharge paperwork placed in packet for receiving facility. Nurse called report to [facility] at [facility]. [facility] picking up patient now for transport.

## 2021-01-03 NOTE — Discharge Instructions (Signed)

## 2021-01-03 NOTE — Discharge Summary (Addendum)
Physician Discharge Summary  Diamond Rice FXT:[id number] DOB: [birth year]  PCP: Diamond Hose, MD  Admitted from: Home Discharged to: SNF  Admit date: 12/27/2020 Discharge date: 01/03/2021  Recommendations for Outpatient Follow-up:    Follow-up Information    [facility]. Schedule an appointment as soon as possible for a visit in 4 week(s).   Specialty: Neurology Contact information: [address] [place] [phone]       Diamond Heys, MD. Schedule an appointment as soon as possible for a visit in 4 week(s).   Specialty: Physical Medicine and Rehabilitation Contact information: [address] [place] [address] [phone]        MD at SNF. Schedule an appointment as soon as possible for a visit.   Why: To be seen in 2 to 3 days with repeat labs (CBC & BMP).       Diamond Silence, MD. Schedule an appointment as soon as possible for a visit.   Specialty: Gastroenterology Why: To be seen in 2 to 4 weeks for further evaluation and management of achalasia. Contact information: [address] [place] [address] [phone]        Diamond Hose, MD. Schedule an appointment as soon as possible for a visit.   Specialty: Internal Medicine Why: Upon discharge from SNF. Contact information: [place] [address] [phone]        Diamond Breeding, MD .   Specialty: Cardiology Contact information: [address] [place] [address] [place] [address] [phone]                Home Health: N/A    Equipment/Devices: TBD at SNF    Discharge Condition: Improved and stable.   Code Status: Full Code Diet recommendation:  Discharge Diet Orders (From admission, onward)    Start     Ordered   01/03/21 0000  Diet - low sodium heart healthy       Comments: Diet consistency should be mechanical soft and thin liquids.  NO meats  absolutely even if pured.   01/03/21 1113   01/03/21 0000  Diet Carb Modified        01/03/21 1113           Discharge Diagnoses:  Principal Problem:   Acute ischemic stroke [facility]) Active Problems:   Hypertension   DM (diabetes mellitus) with peripheral vascular complication (HCC)   Left hemiparesis ([place])   History of CVA with residual deficit   Abnormal finding on lung imaging   Brief Summary: HPI on 12/27/2020 by Dr. Dolan Amen Rice a 50 y.o.femalewith medical history significant ofDM2, HTN, HLD, smoking, prior cocaine abuse, PE (no longer on anticoagulation), medication nonadhering.  Pt with h/o 4 prior strokes: 3 ischemic, 1 hemorrhagic. Pt has chronic L sided deficits from the hemorrhagic stroke. Most recent stroke was ischemic in April of 2021.  Today pt presents to ED with acute worsening of B leg pain and dysarthria. LKW was 10am today. Symptoms are constant, moderate, persistent. Nothing makes symptoms better or worse.  EMS called, pt noted to be very hypertensive.  No fevers, chills.  Interim history Admitted for acute CVA.  Patient with slurred speech and left-sided weakness.  Completed stroke work-up.  Since homeless, deemed not appropriate for CIR.  Also noted history of PAD, COPD, CKD stage II.  Course complicated by recurrent strokelike symptoms on morning of 5/5, stroke code activated,  neurology followed up, repeat MRI without acute findings.  Also developed issues with dysphagia, [facility] consulted and underwent esophageal dilatation and Botox injection for achalasia.   Assessment & Plan   Acute ischemic stroke -Presented with dysarthria and inability to walk -Patient's had a history of prior strokes -Per neurology left CR infarct, likely secondary to small vessel disease source likely due to uncontrolled risk factors including hypertension and hyperlipidemia. -CT head showed no acute large vessel infarction.  Chronic small  vessel ischemic changes throughout the brain somewhat progressive since April 2021 -CTA head and neck showed no large or medium vessel occlusion. -MRI showed 4 to 5 mm acute infarction of the left hemispheric white matter in the upper parietal centrum semiovale region -2D echo: LVEF 60-65%. -LDL 136, hemoglobin A1c 6.6 -UDS positive for THC. -Continue statin. -PT, OT and ST recommended CIR.  Rehab MD consulted and input appreciated, felt appropriate for inpatient rehab but patient reportedly homeless and hence unable to go to CIR.  She will be discharged to SNF for short-term rehab.  Also per rehab MD recommendation, needs outpatient referral to PMR for Botox of left upper extremity to help with spasticity. -As per neurology, patient on aspirin 81 mg daily prior to admission, now on aspirin 81 mg daily.  Continue same regimen at discharge.  No DAPT given history of ICH and uncontrolled hypertension. -On morning of 5/5, patient had recurrence of strokelike symptoms.  Stroke code was activated.  Neurology followed up.  Repeat MRI without acute change, still has evolving left CR small infarct, chronic right basal ganglia MCH.  Discussed with Diamond Rice, stroke MD who felt that this was recrudescence of old strokes in the setting of anxiety and depression versus conversion disorder.  Recommends continuing current management and consider antidepressant if needed.  Clinically improved.  Abnormal finding on lung imaging/suspected chronic aspiration pneumonia -CT head and neck showed 2.8 cm solid and some solid lesion in the lateral aspect of the right upper lobe -CT chest with contrast 5/4: Scattered mixed groundglass and tree-in-bud nodular opacity predominantly which appear confined to right upper lobe.  Appearance favors an infectious or inflammatory process including possible sequelae of aspiration given a chronically patulous and distended thoracic esophagus containing admixed air and ingested material to  the level of the thoracic inlet which is a similar appearance compared to CT from 2019.  Recommend follow-up noncontrast CT imaging to ensure resolution after therapeutic trial.  No other acute intrathoracic process or concerning pulmonary nodules or masses. -Patient does report history of what appears to be aspiration/choking episodes when attempting to burp, likely precipitated by achalasia which is discussed below.    Speech therapy and [facility] evaluated.  Started oral Augmentin x1 week.    Recommend repeating CT chest in 4 weeks to ensure resolution of abnormal findings.  I reviewed the case with PCCM team on 12/29/20 who agreed with this management.  Dysphagia/esophageal achalasia s/p EGD, dilatation and Botox injection. Had EGD 10/27/2020 by [facility] which showed normal esophagus. Speech therapy consulted for swallow evaluation and dietary recommendations.  Discussed with ST who indicated that she had MBS on 5/3 that showed no aspiration.  They recommended barium swallow Barium swallow abnormal, suggestive of achalasia.    [facility] was consulted, patient underwent EGD with esophageal dilatation and Botox injection on 5/6.  Detailed report as below.  Subsequently on 01/01/2021, patient reported feeling something stuck in her food pipe after eating dinner.  Patient was on a soft  diet but got a piece of beef.  Prior TRH MD discussed with GI who recommended giving a dose of glucagon IV x1 followed by soda to drink to see if her symptoms resolve.  Patient has since done well, denies any dysphagia, nausea or vomiting and is asking why she is on a liquid diet and why this diet cannot be advanced.  I discussed in detail with Dr. Acie Fredrickson GI who recommended soft diet and absolutely no meats even if they are crushed/pured.  She explained that even the pured meats can eventually form a ball and cause obstructive issues in the esophagus.  She recommended once daily PPI to avoid stasis related esophageal  ulceration.  She advised outpatient follow-up with Dr. Paulita Fujita in 2 to 4 weeks and patient should be eventually considered for permanent treatment i.e. myomectomy.  Diabetes mellitus, type II -hemoglobin A1c 6.6 suggests good control. -Currently in the hospital despite holding her Levemir insulin, she has been reasonably controlled on SSI alone.  Thereby will discontinue Levemir and continue SSI at discharge.  However her CBGs will need to be closely monitored at SNF to consider resumption of Levemir at low-dose and titrating up as needed.  Essential hypertension -Initially allowed for permissive hypertension given acute CVA.  Home antihypertensives were all held.  She was then noted to be markedly hypertensive on 12/29/2020 and a couple of her home antihypertensives with initially resumed and over the next couple days, rest of the antihypertensives were added as below.  Blood pressure control has significantly improved.  We will continue close monitoring and dose adjustment at SNF as deemed necessary.  Based on the polypharmacy antihypertensives that she was on PTA, it appears that she has resistant hypertension.  May consider referral to hypertension clinic managed by Dr. Skeet Latch for further evaluation.  Polysubstance abuse -Tox screen was positive for THC.  Cessation counseled.  Hyperlipidemia -Lipid panel shows total cholesterol 209, HDL 60, LDL is 136, triglycerides 63 -Continue statin.  Patient was noncompliant with this.  Compliance counseled. -Recommend repeating LFTs and fasting lipids in 4 weeks.  Hypokalemia: Replaced.  Magnesium normal.  Moderate to severe aortic valve regurgitation Noted on 2D echo.  Recommend outpatient cardiology evaluation and management as deemed necessary.   Consultants Neurology [facility]  Procedures  EGD 12/31/2020:  Impression: - Dilation in the upper third of the esophagus, in the middle third of the esophagus and in the lower third  of the esophagus. - Benign-appearing esophageal stenosis. Dilated. Injected with botulinum toxin. - Tortuous esophagus. - A small amount of food (residue) in the stomach. - Normal stomach. - Normal examined duodenum. - No specimens collected.  Recommendation: - Patient will benefit from esophageal myotomy for (clinical picture and imaging consistenct with) achalasia.   Discharge Instructions  Discharge Instructions    Ambulatory referral to Neurology   Complete by: As directed    Follow up with stroke clinic NP (Jessica Vanschaick or Cecille Rubin, if both not available, consider Zachery Dauer, or A[place]) at [facility] in about 4 weeks. Thanks.   Ambulatory referral to Physical Medicine Rehab   Complete by: As directed    Left spastic hemiparesis for botox   Call MD for:   Complete by: As directed    Recurrent strokelike symptoms.   Call MD for:  difficulty breathing, headache or visual disturbances   Complete by: As directed    Call MD for:  extreme fatigue   Complete by: As directed    Call MD  for:  persistant dizziness or light-headedness   Complete by: As directed    Call MD for:  persistant nausea and vomiting   Complete by: As directed    Call MD for:  severe uncontrolled pain   Complete by: As directed    Call MD for:  temperature >100.4   Complete by: As directed    Diet - low sodium heart healthy   Complete by: As directed    Diet consistency should be mechanical soft and thin liquids.  NO meats absolutely even if pured.   Diet Carb Modified   Complete by: As directed    Increase activity slowly   Complete by: As directed        Medication List    STOP taking these medications   insulin detemir 100 UNIT/ML FlexPen Commonly known as: LEVEMIR     TAKE these medications   Accu-Chek Guide test strip Generic drug: glucose blood 2 (two) times daily. as directed   Accu-Chek Guide w/Device Kit USE AS DIRECTED TO CHECK BLOOD SUGAR   Accu-Chek Softclix Lancets  lancets 2 (two) times daily.   amLODipine 10 MG tablet Commonly known as: NORVASC Take 1 tablet (10 mg total) by mouth daily.   amoxicillin-clavulanate 875-125 MG tablet Commonly known as: AUGMENTIN Take 1 tablet by mouth every 12 (twelve) hours. Discontinue after Jan 04, 2021 doses.   ARIPiprazole 5 MG tablet Commonly known as: ABILIFY Take 5 mg by mouth daily.   aspirin 81 MG EC tablet Take 1 tablet (81 mg total) by mouth daily.   atorvastatin 40 MG tablet Commonly known as: LIPITOR Take 2 tablets (80 mg total) by mouth daily at 6 PM. What changed: how much to take   cloNIDine 0.1 MG tablet Commonly known as: CATAPRES Take 1 tablet (0.1 mg total) by mouth 2 (two) times daily. What changed:   medication strength  how much to take  when to take this   hydrALAZINE 100 MG tablet Commonly known as: APRESOLINE Take 1 tablet (100 mg total) by mouth 3 (three) times daily.   insulin aspart 100 UNIT/ML injection Commonly known as: novoLOG Inject 0-9 Units into the skin 3 (three) times daily with meals. Correction coverage: Sensitive (thin, NPO, renal) CBG < 70: Implement Hypoglycemia protocol. CBG 70 - 120: 0 units CBG 121 - 150: 1 unit CBG 151 - 200: 2 units CBG 201 - 250: 3 units CBG 251 - 300: 5 units CBG 301 - 350: 7 units CBG 351 - 400: 9 units CBG > 400: call MD.   lisinopril 40 MG tablet Commonly known as: ZESTRIL Take 1 tablet (40 mg total) by mouth daily.   metoprolol succinate 50 MG 24 hr tablet Commonly known as: TOPROL-XL Take 50 mg by mouth every morning.   mirtazapine 15 MG tablet Commonly known as: REMERON Take 1 tablet (15 mg total) by mouth at bedtime. For Depression   pantoprazole 40 MG tablet Commonly known as: PROTONIX Take 1 tablet (40 mg total) by mouth daily.      Allergies  Allergen Reactions  . Morphine And Related Hives  . Metformin Diarrhea and Other (See Comments)    "Allergic," per [facility]      Procedures/Studies: CT  CHEST W CONTRAST  Result Date: 12/29/2020 CLINICAL DATA:  Lung nodule seen on CT angiography of the head and neck. EXAM: CT CHEST WITH CONTRAST TECHNIQUE: Multidetector CT imaging of the chest was performed during intravenous contrast administration. CONTRAST:  73mL OMNIPAQUE IOHEXOL 300 MG/ML  SOLN COMPARISON:  CT a head neck 12/27/2020, chest radiograph 12/27/2020, CT a chest 06/22/2018 FINDINGS: Cardiovascular: Mild cardiomegaly. No pericardial effusion. Scattered coronary artery calcifications. Pulmonary arteries are normal caliber. No large central filling defects are identified on this non tailored examination of the pulmonary arteries. Atherosclerotic plaque within the normal caliber aorta. No acute luminal abnormality of the imaged aorta. No periaortic stranding or hemorrhage. Shared origin of the brachiocephalic and left common carotid arteries. Minimal calcifications in the proximal great vessels. No major venous abnormalities. Suspect laminar flow mixing artifact in the internal jugular veins. No significant venous reflux. Mediastinum/Nodes: Redemonstration of a patulous, distended thoracic esophagus which contains layering air-fluid levels and ingested material to the level of the thoracic inlet. No acute tracheal abnormality. Thyroid gland and thoracic inlet is unremarkable. No worrisome mediastinal, hilar or axillary adenopathy. Lungs/Pleura: Corresponding well to the opacity seen on the CT angiography of the head neck are several patchy ground-glass and tree-in-bud opacities present in the right upper lobe in a somewhat branching configuration, favored to reflect some acute infectious or inflammatory process including sequela of aspiration. No other concerning pulmonary nodules or masses. No other consolidative process. No pneumothorax or visible effusion. No convincing CT evidence of edema. Some bandlike opacity in the lung bases, likely atelectasis or scarring. Upper Abdomen: High attenuation  enteric contrast media layers dependently within the stomach. No acute abnormalities present in the visualized portions of the upper abdomen. Scattered colonic diverticula without focal inflammation to suggest diverticulitis. Musculoskeletal: Multilevel degenerative changes are present in the imaged portions of the spine. Multilevel flowing anterior osteophytosis, compatible with features of diffuse idiopathic skeletal hyperostosis (DISH). Additional degenerative changes in the bilateral shoulders. Features of right calcific tendinosis/hydroxyapatite deposition. No acute or worrisome osseous abnormalities. Mild dextrocurvature of the spine. No worrisome chest wall mass or lesion. IMPRESSION: There are scattered mixed ground-glass and tree-in-bud nodular opacities predominantly which appear confined to the right upper lobe. Appearance favors an infectious or inflammatory process including possible sequela of aspiration given a chronically patulous and distended thoracic esophagus containing admixed air and ingested material to the level of the thoracic inlet, which is a similar appearance to comparison CT from 2019. Recommend follow-up noncontrast CT imaging to ensure resolution after therapeutic trial. No other acute intrathoracic process. No other concerning pulmonary nodules or masses. Cardiomegaly.  Coronary artery atherosclerosis. Aortic Atherosclerosis (ICD10-I70.0). Colonic diverticulosis. Multilevel degenerative changes in the spine including features of diffuse idiopathic skeletal hyperostosis. Suspect some hydroxyapatite deposition/calcific tendinosis of the right shoulder. Electronically Signed   By: Lovena Le M.D.   On: 12/29/2020 03:12   MR ANGIO HEAD WO CONTRAST  Result Date: 12/30/2020 CLINICAL DATA:  Follow-up left hemisphere white matter stroke. EXAM: MRI HEAD WITHOUT CONTRAST MRA HEAD WITHOUT CONTRAST TECHNIQUE: Multiplanar, multiecho pulse sequences of the brain and surrounding structures  were obtained without intravenous contrast. Angiographic images of the head were obtained using MRA technique without contrast. COMPARISON:  12/27/2020 FINDINGS: MRI HEAD FINDINGS Brain: Diffusion imaging does not show any progressive change related to 4-5 mm acute infarction of the left centrum semiovale region. Elsewhere, chronic small-vessel ischemic changes are evident throughout the hemispheric white matter. Old small vessel infarctions of the thalami. Old hemorrhagic infarction of the right basal ganglia. No change in those findings since the study of 3 days ago. No hydrocephalus or extra-axial collection. Vascular: Major vessels at the base of the brain show flow. Skull and upper cervical spine: Negative Sinuses/Orbits: Clear/normal Other: None MRA HEAD FINDINGS Both internal  carotid arteries are patent through the skull base and siphon region. Some siphon atherosclerotic irregularity. The anterior and middle cerebral vessels are patent. No large or medium vessel occlusion. Moderate distal vessel atherosclerotic irregularity is noted. Motion artifact degrades evaluation of the vertebral arteries. Basilar artery is patent. 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 Posterior circulation branch vessels are patent. Patent bilateral posterior communicating arteries. There is atherosclerotic narrowing and irregularity of the PCA branches with proximal stenosis ease. IMPRESSION: No acute large vessel occlusion. Atherosclerotic irregularity of the distal intracranial branch vessels. 2 mm basilar tip aneurysm. No change in the 4-5 mm acute infarction of the left posterior centrum semiovale white matter. Extensive chronic ischemic changes elsewhere throughout the brain as previously seen. Electronically Signed   By: Nelson Chimes M.D.   On: 12/30/2020 08:57   MR BRAIN WO CONTRAST  Result Date: 12/30/2020 CLINICAL DATA:  Follow-up left hemisphere white matter stroke. EXAM: MRI HEAD WITHOUT CONTRAST MRA HEAD WITHOUT  CONTRAST TECHNIQUE: Multiplanar, multiecho pulse sequences of the brain and surrounding structures were obtained without intravenous contrast. Angiographic images of the head were obtained using MRA technique without contrast. COMPARISON:  12/27/2020 FINDINGS: MRI HEAD FINDINGS Brain: Diffusion imaging does not show any progressive change related to 4-5 mm acute infarction of the left centrum semiovale region. Elsewhere, chronic small-vessel ischemic changes are evident throughout the hemispheric white matter. Old small vessel infarctions of the thalami. Old hemorrhagic infarction of the right basal ganglia. No change in those findings since the study of 3 days ago. No hydrocephalus or extra-axial collection. Vascular: Major vessels at the base of the brain show flow. Skull and upper cervical spine: Negative Sinuses/Orbits: Clear/normal Other: None MRA HEAD FINDINGS Both internal carotid arteries are patent through the skull base and siphon region. Some siphon atherosclerotic irregularity. The anterior and middle cerebral vessels are patent. No large or medium vessel occlusion. Moderate distal vessel atherosclerotic irregularity is noted. Motion artifact degrades evaluation of the vertebral arteries. Basilar artery is patent. 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 Posterior circulation branch vessels are patent. Patent bilateral posterior communicating arteries. There is atherosclerotic narrowing and irregularity of the PCA branches with proximal stenosis ease. IMPRESSION: No acute large vessel occlusion. Atherosclerotic irregularity of the distal intracranial branch vessels. 2 mm basilar tip aneurysm. No change in the 4-5 mm acute infarction of the left posterior centrum semiovale white matter. Extensive chronic ischemic changes elsewhere throughout the brain as previously seen. Electronically Signed   By: Nelson Chimes M.D.   On: 12/30/2020 08:57   MR BRAIN WO CONTRAST  Result Date: 12/27/2020 CLINICAL DATA:   Acute stroke presentation with slurred speech. EXAM: MRI HEAD WITHOUT CONTRAST TECHNIQUE: Multiplanar, multiecho pulse sequences of the brain and surrounding structures were obtained without intravenous contrast. COMPARISON:  CT studies earlier same day.  MRI 12/06/2019. FINDINGS: Brain: Diffusion imaging shows a 4-5 mm acute infarction within the left upper centrum semiovale region. No other acute infarction. Chronic small-vessel ischemic changes affect pons. Old small vessel cerebellar infarctions. Old small vessel infarctions of the thalami and basal ganglia. Hemosiderin deposition in the right basal ganglia and radiating white matter tracts. Chronic small-vessel ischemic changes of the hemispheric white matter. No large vessel territory infarction. No mass lesion, acute hemorrhage, hydrocephalus or extra-axial collection. Vascular: Major vessels at the base of the brain show flow. Skull and upper cervical spine: Negative Sinuses/Orbits: Clear/normal Other: None IMPRESSION: 4-5 mm acute infarction of the left hemispheric white matter in the upper  parietal centrum semiovale region. No large or cortical infarction. Extensive chronic ischemic changes elsewhere throughout the brain as outlined above. Electronically Signed   By: Nelson Chimes M.D.   On: 12/27/2020 20:50   DG Chest Port 1 View  Result Date: 12/27/2020 CLINICAL DATA:  Altered mental status, indistinct focal opacity on neck CT angiogram EXAM: PORTABLE CHEST 1 VIEW COMPARISON:  10/27/2020 chest radiograph. FINDINGS: Stable cardiomediastinal silhouette with mild cardiomegaly. No pneumothorax. No pleural effusion. Indistinct faint opacity in the apical right lung. Otherwise clear lungs with no overt pulmonary edema. IMPRESSION: Stable mild cardiomegaly without overt pulmonary edema. Indistinct faint opacity in the apical right lung, correlating to the site of mixed density opacity on the neck CT angiogram from earlier today. Short-term follow-up dedicated  chest CT recommended when clinically feasible. Electronically Signed   By: Ilona Sorrel M.D.   On: 12/27/2020 19:11   DG Swallowing Func-Speech Pathology  Result Date: 12/28/2020 Objective Swallowing Evaluation: Type of Study: MBS-Modified Barium Swallow Study  Patient Details Name: Daveigh Batty MRN: [id number] Date of Birth: [birth year] Today's Date: 12/28/2020 Time: SLP Start Time (ACUTE ONLY): 7591 -SLP Stop Time (ACUTE ONLY): 1400 SLP Time Calculation (min) (ACUTE ONLY): 14 min Past Medical History: Past Medical History: Diagnosis Date . Anxiety  . Chronic lower back pain 04/17/2012  "just got over some; catched when I walked" . COPD (chronic obstructive pulmonary disease) ([place])  . Critical lower limb ischemia ([place]) 05/27/2018 . Depression  . Headache(784.0) 04/17/2012  "~ qod; lately waking up in am w/one" . High cholesterol  . Hypertension  . Migraines 04/17/2012 . Obesity  . Sleep apnea   CPAP . Stroke ([place])  . Type II diabetes mellitus ([place]) 08/28/2001 Past Surgical History: Past Surgical History: Procedure Laterality Date . ABDOMINAL AORTOGRAM W/LOWER EXTREMITY Left 05/27/2018  Procedure: ABDOMINAL AORTOGRAM W/LOWER EXTREMITY Runoff and Possible Intervention;  Surgeon: Waynetta Sandy, MD;  Location: [place] [facility];  Service: Cardiovascular;  Laterality: Left; . APPLICATION OF WOUND VAC Left 63/84/6659  Procedure: APPLICATION OF WOUND VAC;  Surgeon: Waynetta Sandy, MD;  Location: [place];  Service: Vascular;  Laterality: Left; . BIOPSY  04/04/2018  Procedure: BIOPSY;  Surgeon: [name], MD;  Location: [facility] ENDOSCOPY;  Service: Endoscopy;; . BIOPSY  10/27/2020  Procedure: BIOPSY;  Surgeon: Diamond Silence, MD;  Location: [facility] ENDOSCOPY;  Service: Endoscopy;; . CARDIAC CATHETERIZATION  ~ 2011 . COLONOSCOPY N/A 04/04/2018  Procedure: COLONOSCOPY;  Surgeon: [name], MD;  Location: [facility] ENDOSCOPY;  Service: Endoscopy;  Laterality: N/A; . COLONOSCOPY WITH PROPOFOL N/A 10/27/2020  Procedure:  COLONOSCOPY WITH PROPOFOL;  Surgeon: Diamond Silence, MD;  Location: [facility] ENDOSCOPY;  Service: Endoscopy;  Laterality: N/A; . ESOPHAGOGASTRODUODENOSCOPY (EGD) WITH PROPOFOL N/A 10/27/2020  Procedure: ESOPHAGOGASTRODUODENOSCOPY (EGD) WITH PROPOFOL  WITH POSSIBLE DIL;  Surgeon: Diamond Silence, MD;  Location: [facility] ENDOSCOPY;  Service: Endoscopy;  Laterality: N/A; . LOWER EXTREMITY ANGIOGRAM Left 05/27/2018 . PERIPHERAL VASCULAR INTERVENTION Left 05/27/2018  Procedure: PERIPHERAL VASCULAR INTERVENTION;  Surgeon: Waynetta Sandy, MD;  Location: [place] [facility];  Service: Cardiovascular;  Laterality: Left; . POLYPECTOMY  04/04/2018  Procedure: POLYPECTOMY;  Surgeon: [name], MD;  Location: [facility] ENDOSCOPY;  Service: Endoscopy;; . POLYPECTOMY  10/27/2020  Procedure: POLYPECTOMY;  Surgeon: Diamond Silence, MD;  Location: [facility]OSCOPY;  Service: Endoscopy;; . TRANSMETATARSAL AMPUTATION Left 05/28/2018  Procedure: AMPUTATION TOES THREE, FOUR AND FIVE on left foot;  Surgeon: Waynetta Sandy, MD;  Location: [place];  Service: Vascular;  Laterality: Left; . WOUND  DEBRIDEMENT Left 06/06/2018  Procedure: DEBRIDEMENT WOUND LEFT FOOT;  Surgeon: Waynetta Sandy, MD;  Location: [facility];  Service: Vascular;  Laterality: Left; HPI: Pt is a 50 yo female with a h/o 4 prior strokes presents wtih an acute worsening of BLE pain and dysarthria. MRI showed an acute infarction of the L hemispheric white matter in the upper parietal centrum semiovale region. PMH also includes: DM2, HTN, HLD, smoking, prior cocaine abuse, PE, medication nonadhering. Most recent MBS 12/08/19 revealed aspiration of thin liquids (PAS 7, 8) with recs for Dys 2/Nectar. She reports advancing to regular solids/thin liquids at [facility]F after FEES. Pt also had dysarthria at that time impacting word to short phrase level.  Subjective: eager to eat and drink Assessment / Plan / Recommendation CHL IP CLINICAL IMPRESSIONS 12/28/2020 Clinical Impression Pt  presents with a mild oral dysphagia with pharyngeal phase relatively WFL. She has intermittent oral holding with liquids, with mild lingual residue also noted primarily on her first few sips. She also has loss of regular solids to the pyriform sinuses while she is still masticating, but there is no aspiration across all consistencies. Recommend starting a diet consisting of regular solids and thin liquids. Given coughing noted during initial evaluation, will f/u briefly. SLP Visit Diagnosis Dysphagia, oral phase (R13.11) Attention and concentration deficit following -- Frontal lobe and executive function deficit following -- Impact on safety and function Mild aspiration risk   CHL IP TREATMENT RECOMMENDATION 12/28/2020 Treatment Recommendations Therapy as outlined in treatment plan below   Prognosis 12/28/2020 Prognosis for Safe Diet Advancement Good Barriers to Reach Goals -- Barriers/Prognosis Comment -- CHL IP DIET RECOMMENDATION 12/28/2020 SLP Diet Recommendations Regular solids;Thin liquid Liquid Administration via Cup;Straw Medication Administration Whole meds with liquid Compensations Slow rate;Small sips/bites Postural Changes Seated upright at 90 degrees;Remain semi-upright after after feeds/meals (Comment)   CHL IP OTHER RECOMMENDATIONS 12/28/2020 Recommended Consults -- Oral Care Recommendations Oral care BID Other Recommendations --   CHL IP FOLLOW UP RECOMMENDATIONS 12/28/2020 Follow up Recommendations Inpatient Rehab   CHL IP FREQUENCY AND DURATION 12/28/2020 Speech Therapy Frequency (ACUTE ONLY) min 2x/week Treatment Duration 2 weeks      CHL IP ORAL PHASE 12/28/2020 Oral Phase Impaired Oral - Pudding Teaspoon -- Oral - Pudding Cup -- Oral - Honey Teaspoon -- Oral - Honey Cup -- Oral - Nectar Teaspoon -- Oral - Nectar Cup -- Oral - Nectar Straw -- Oral - Thin Teaspoon -- Oral - Thin Cup Lingual/palatal residue;Holding of bolus Oral - Thin Straw Holding of bolus Oral - Puree WFL Oral - Mech Soft -- Oral - Regular  Premature spillage;Impaired mastication Oral - Multi-Consistency -- Oral - Pill WFL Oral Phase - Comment --  CHL IP PHARYNGEAL PHASE 12/28/2020 Pharyngeal Phase WFL Pharyngeal- Pudding Teaspoon -- Pharyngeal -- Pharyngeal- Pudding Cup -- Pharyngeal -- Pharyngeal- Honey Teaspoon -- Pharyngeal -- Pharyngeal- Honey Cup -- Pharyngeal -- Pharyngeal- Nectar Teaspoon -- Pharyngeal -- Pharyngeal- Nectar Cup -- Pharyngeal -- Pharyngeal- Nectar Straw -- Pharyngeal -- Pharyngeal- Thin Teaspoon -- Pharyngeal -- Pharyngeal- Thin Cup -- Pharyngeal -- Pharyngeal- Thin Straw -- Pharyngeal -- Pharyngeal- Puree -- Pharyngeal -- Pharyngeal- Mechanical Soft -- Pharyngeal -- Pharyngeal- Regular -- Pharyngeal -- Pharyngeal- Multi-consistency -- Pharyngeal -- Pharyngeal- Pill -- Pharyngeal -- Pharyngeal Comment --  CHL IP CERVICAL ESOPHAGEAL PHASE 12/28/2020 Cervical Esophageal Phase WFL Pudding Teaspoon -- Pudding Cup -- Honey Teaspoon -- Honey Cup -- Nectar Teaspoon -- Nectar Cup -- Nectar Straw -- Thin Teaspoon -- Thin Cup -- Thin  Straw -- Puree -- Mechanical Soft -- Regular -- Multi-consistency -- Pill -- Cervical Esophageal Comment -- Osie Bond., M.A. [place] [facility] Pager [phone] Office [phone] 12/28/2020, 3:02 PM              ECHOCARDIOGRAM COMPLETE  Result Date: 12/28/2020    ECHOCARDIOGRAM REPORT   Patient Name:   PERCILLA TWETEN Date of Exam: 12/28/2020 Medical Rec #:  [id number]         Height:       61.0 in Accession #:    [id number]        Weight:       169.8 lb Date of Birth:  1971-08-03          BSA:          1.762 m Patient Age:    20 years          BP:           199/124 mmHg Patient Gender: F                 HR:           73 bpm. Exam Location:  Inpatient Procedure: 2D Echo Indications:    stroke  History:        Patient has prior history of Echocardiogram examinations, most                 recent 12/07/2019. Risk Factors:Hypertension, Dyslipidemia and                 Diabetes.  Sonographer:     Johny Chess Referring Phys: [place]  1. Left ventricular ejection fraction, by estimation, is 60 to 65%. The left ventricle has normal function. The left ventricle has no regional wall motion abnormalities. There is moderate left ventricular hypertrophy. Left ventricular diastolic parameters are consistent with Grade I diastolic dysfunction (impaired relaxation).  2. Right ventricular systolic function is normal. The right ventricular size is normal.  3. Left atrial size was mildly dilated.  4. The mitral valve is normal in structure. No evidence of mitral valve regurgitation. No evidence of mitral stenosis.  5. Left coronary cusp calcification. The aortic valve is tricuspid. There is mild calcification of the aortic valve. There is mild thickening of the aortic valve. Aortic valve regurgitation is moderate to severe. No aortic stenosis is present.  6. The inferior vena cava is normal in size with greater than 50% respiratory variability, suggesting right atrial pressure of 3 mmHg. Conclusion(s)/Recommendation(s): No intracardiac source of embolism detected on this transthoracic study. A transesophageal echocardiogram is recommended to exclude cardiac source of embolism if clinically indicated. FINDINGS  Left Ventricle: Left ventricular ejection fraction, by estimation, is 60 to 65%. The left ventricle has normal function. The left ventricle has no regional wall motion abnormalities. The left ventricular internal cavity size was normal in size. There is  moderate left ventricular hypertrophy. Left ventricular diastolic parameters are consistent with Grade I diastolic dysfunction (impaired relaxation). Right Ventricle: The right ventricular size is normal. No increase in right ventricular wall thickness. Right ventricular systolic function is normal. Left Atrium: Left atrial size was mildly dilated. Right Atrium: Right atrial size was normal in size. Pericardium: There is no evidence of  pericardial effusion. Mitral Valve: The mitral valve is normal in structure. No evidence of mitral valve regurgitation. No evidence of mitral valve stenosis. Tricuspid Valve: The tricuspid valve is normal in structure. Tricuspid valve regurgitation is not demonstrated. No  evidence of tricuspid stenosis. Aortic Valve: Left coronary cusp calcification. The aortic valve is tricuspid. There is mild calcification of the aortic valve. There is mild thickening of the aortic valve. Aortic valve regurgitation is moderate to severe. Aortic regurgitation PHT measures 489 msec. No aortic stenosis is present. Pulmonic Valve: The pulmonic valve was normal in structure. Pulmonic valve regurgitation is not visualized. No evidence of pulmonic stenosis. Aorta: The aortic root is normal in size and structure. Venous: The inferior vena cava is normal in size with greater than 50% respiratory variability, suggesting right atrial pressure of 3 mmHg. IAS/Shunts: No atrial level shunt detected by color flow Doppler.  LEFT VENTRICLE PLAX 2D LVIDd:         3.90 cm LVIDs:         3.00 cm LV PW:         1.60 cm LV IVS:        1.30 cm LVOT diam:     2.00 cm LV SV:         75 LV SV Index:   43 LVOT Area:     3.14 cm  LV Volumes (MOD) LV vol d, MOD A2C: 81.6 ml LV vol s, MOD A2C: 34.5 ml LV SV MOD A2C:     47.1 ml RIGHT VENTRICLE             IVC RV S prime:     14.90 cm/s  IVC diam: 1.30 cm TAPSE (M-mode): 2.3 cm LEFT ATRIUM             Index       RIGHT ATRIUM           Index LA diam:        3.00 cm 1.70 cm/m  RA Area:     14.10 cm LA Vol (A2C):   61.3 ml 34.80 ml/m RA Volume:   30.60 ml  17.37 ml/m LA Vol (A4C):   56.4 ml 32.02 ml/m LA Biplane Vol: 59.9 ml 34.00 ml/m  AORTIC VALVE LVOT Vmax:   124.00 cm/s LVOT Vmean:  77.600 cm/s LVOT VTI:    0.239 m AI PHT:      489 msec  AORTA Ao Root diam: 2.60 cm Ao Asc diam:  3.40 cm MV E velocity: 66.70 cm/s MV A velocity: 132.00 cm/s  SHUNTS MV E/A ratio:  0.51         Systemic VTI:  0.24 m                              Systemic Diam: 2.00 cm Candee Furbish MD Electronically signed by Candee Furbish MD Signature Date/Time: 12/28/2020/11:31:32 AM    Final    CT HEAD CODE STROKE WO CONTRAST  Result Date: 12/27/2020 CLINICAL DATA:  Code stroke. Acute presentation with slurred speech. EXAM: CT HEAD WITHOUT CONTRAST TECHNIQUE: Contiguous axial images were obtained from the base of the skull through the vertex without intravenous contrast. COMPARISON:  MRI 12/06/2019.  CT 12/06/2019 FINDINGS: Brain: No abnormality seen affecting the brainstem or cerebellum. 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 Chronic small-vessel ischemic changes are present throughout the hemispheric white matter. This appears progressive since last year. No definitely acute stroke. One could not rule out the possibility of a recent small vessel infarction. No sign of large vessel or cortical infarction Vascular: There is atherosclerotic calcification of the major vessels at the base of the brain. Skull: Negative Sinuses/Orbits:  Clear/normal Other: None ASPECTS (Chauncey Stroke Program Early CT Score) - Ganglionic level infarction (caudate, lentiform nuclei, internal capsule, insula, M1-M3 cortex): 7 - Supraganglionic infarction (M4-M6 cortex): 3 Total score (0-10 with 10 being normal): 10 IMPRESSION: 1. No sign of acute large vessel infarction. Chronic small-vessel ischemic changes throughout the brain, somewhat progressive since April of 2021. Cannot rule out the possibility of a more recent small vessel infarction. 2. ASPECTS is 10 3. These results were communicated to Dr. Cheral Marker at 6:04 pmon 5/2/2022by text page via the [facility] messaging system. Electronically Signed   By: Nelson Chimes M.D.   On: 12/27/2020 18:05   CT ANGIO HEAD CODE STROKE  Result Date: 12/27/2020 CLINICAL DATA:  Acute stroke presentation with slurred speech. EXAM: CT ANGIOGRAPHY HEAD AND NECK TECHNIQUE: Multidetector CT imaging of the head and neck was  performed using the standard protocol during bolus administration of intravenous contrast. Multiplanar CT image reconstructions and MIPs were obtained to evaluate the vascular anatomy. Carotid stenosis measurements (when applicable) are obtained utilizing NASCET criteria, using the distal internal carotid diameter as the denominator. CONTRAST:  96mL OMNIPAQUE IOHEXOL 350 MG/ML SOLN COMPARISON:  Head CT same day FINDINGS: CTA NECK FINDINGS Aortic arch: Aortic atherosclerosis. Branching pattern is normal without origin stenosis. Right carotid system: Common carotid artery widely patent to the bifurcation. Soft and calcified plaque at the carotid bifurcation and ICA bulb. No stenosis beyond the diameter of the more distal cervical ICA. Left carotid system: Common carotid artery widely patent to the bifurcation. Mild soft plaque at the carotid bifurcation and ICA bulb but no stenosis beyond the diameter of the more distal cervical ICA. Vertebral arteries: Both vertebral artery origins are widely patent. Both vertebral arteries appear normal through the cervical region to the foramen magnum. Skeleton: Ordinary cervical spondylosis. Other neck: No mass or lymphadenopathy. Dilated fluid and air-filled esophagus. Upper chest: Abnormal density in the lateral aspect of the right upper lobe with solid and sub solid components measuring approximately 2.8 cm. This has a worrisome appearance and could possibly be neoplastic or infectious. Review of the MIP images confirms the above findings CTA HEAD FINDINGS Anterior circulation: Both internal carotid arteries are patent through the skull base and siphon regions. Siphon atherosclerotic calcification but without stenosis greater than 30%. The anterior and middle cerebral vessels are patent. No large or medium vessel occlusion is seen. Distal vessel narrowing and atherosclerotic irregularity. Posterior circulation: Both vertebral arteries are patent to the basilar. The basilar is a  small vessel. Both posterior cerebral arteries receive most of there supply from the anterior circulation. Distal vessel narrowing and irregularity within the PCA branches. Venous sinuses: Patent and normal. Anatomic variants: None significant. Review of the MIP images confirms the above findings IMPRESSION: No large or medium vessel occlusion. Atherosclerotic plaque at both carotid bifurcations but without stenosis. Intracranial distal branch vessel irregularity. Dilated fluid and air-filled esophagus. 2.8 cm solid and sub solid lesion in the lateral aspect of the right upper lobe. This could be pneumonia or, more concerning, a neoplastic lesion. Complete chest CT suggested when able. This may require further workup. Electronically Signed   By: Nelson Chimes M.D.   On: 12/27/2020 18:27   CT ANGIO NECK CODE STROKE  Result Date: 12/27/2020 CLINICAL DATA:  Acute stroke presentation with slurred speech. EXAM: CT ANGIOGRAPHY HEAD AND NECK TECHNIQUE: Multidetector CT imaging of the head and neck was performed using the standard protocol during bolus administration of intravenous contrast. Multiplanar CT image reconstructions and MIPs  were obtained to evaluate the vascular anatomy. Carotid stenosis measurements (when applicable) are obtained utilizing NASCET criteria, using the distal internal carotid diameter as the denominator. CONTRAST:  75mL OMNIPAQUE IOHEXOL 350 MG/ML SOLN COMPARISON:  Head CT same day FINDINGS: CTA NECK FINDINGS Aortic arch: Aortic atherosclerosis. Branching pattern is normal without origin stenosis. Right carotid system: Common carotid artery widely patent to the bifurcation. Soft and calcified plaque at the carotid bifurcation and ICA bulb. No stenosis beyond the diameter of the more distal cervical ICA. Left carotid system: Common carotid artery widely patent to the bifurcation. Mild soft plaque at the carotid bifurcation and ICA bulb but no stenosis beyond the diameter of the more distal  cervical ICA. Vertebral arteries: Both vertebral artery origins are widely patent. Both vertebral arteries appear normal through the cervical region to the foramen magnum. Skeleton: Ordinary cervical spondylosis. Other neck: No mass or lymphadenopathy. Dilated fluid and air-filled esophagus. Upper chest: Abnormal density in the lateral aspect of the right upper lobe with solid and sub solid components measuring approximately 2.8 cm. This has a worrisome appearance and could possibly be neoplastic or infectious. Review of the MIP images confirms the above findings CTA HEAD FINDINGS Anterior circulation: Both internal carotid arteries are patent through the skull base and siphon regions. Siphon atherosclerotic calcification but without stenosis greater than 30%. The anterior and middle cerebral vessels are patent. No large or medium vessel occlusion is seen. Distal vessel narrowing and atherosclerotic irregularity. Posterior circulation: Both vertebral arteries are patent to the basilar. The basilar is a small vessel. Both posterior cerebral arteries receive most of there supply from the anterior circulation. Distal vessel narrowing and irregularity within the PCA branches. Venous sinuses: Patent and normal. Anatomic variants: None significant. Review of the MIP images confirms the above findings IMPRESSION: No large or medium vessel occlusion. Atherosclerotic plaque at both carotid bifurcations but without stenosis. Intracranial distal branch vessel irregularity. Dilated fluid and air-filled esophagus. 2.8 cm solid and sub solid lesion in the lateral aspect of the right upper lobe. This could be pneumonia or, more concerning, a neoplastic lesion. Complete chest CT suggested when able. This may require further workup. Electronically Signed   By: Nelson Chimes M.D.   On: 12/27/2020 18:27   DG ESOPHAGUS W SINGLE CM (SOL OR THIN BA)  Result Date: 12/30/2020 CLINICAL DATA:  Soft ill dysphagia. Feels like food gets stuck  in her throat and has trouble burping since her stroke last year EXAM: ESOPHOGRAM/BARIUM SWALLOW TECHNIQUE: Single contrast examination was performed using  thin barium. FLUOROSCOPY TIME:  Fluoroscopy Time:  3 minutes 12 seconds Radiation Exposure Index (if provided by the fluoroscopic device): 49.2 mGy Number of Acquired Spot Images: 7 COMPARISON:  Chest CT 12/29/2020 FINDINGS: Thoracic esophagram was performed utilizing thin barium only in the LPO position due to patient condition. There is severe esophageal dysmotility with impaired relaxation of the lower esophageal sphincter not coordinated with esophageal contractions. There is stasis of barium in the esophagus and esophageal dilation. There is distal segment narrowing of approximately 3.5 cm. IMPRESSION: Distal esophageal narrowing with segment measuring approximately 3.0 cm, with upstream esophageal dilation, and esophageal dysmotility with contractions not in coordination with the lower esophageal sphincter. Findings are compatible with a diagnosis of achalasia. Recommend gastroenterology referral/consultation. Electronically Signed   By: Maurine Simmering   On: 12/30/2020 11:40      Subjective: Denies complaints.  Is wondering why she is still on liquid diet.  No further sensation of food stuck  in her food passage, nausea, vomiting or chest pain.  No recurrence of strokelike symptoms.  Discharge Exam:  Vitals:   01/02/21 1921 01/02/21 2307 01/03/21 0339 01/03/21 1116  BP: 131/85 129/86 (!) 153/90 129/89  Pulse: 84 74 66 70  Resp: $Remo'18 18 18 17  'sEqpH$ Temp: 98.1 F (36.7 C) 98.1 F (36.7 C) 97.6 F (36.4 C) 98 F (36.7 C)  TempSrc: Oral Oral  Oral  SpO2: 100% 100% 100% 100%  Weight:      Height:        General exam: Young female, moderately built and obese lying comfortably propped up in bed without distress. Respiratory system:  Clear to auscultation.  No increased work of breathing. Cardiovascular system: S1 and S2 heard, RRR.  No JVD,  murmurs or pedal edema.   Telemetry personally reviewed: Sinus rhythm. Gastrointestinal system: Abdomen is nondistended, soft and nontender. No organomegaly or masses felt. Normal bowel sounds heard. Central nervous system:  Alert and oriented.  Dysarthria.  Facial asymmetry +. Extremities:  Right upper extremity grade 4+ by 5 power.  Right lower extremity grade 4 x 5 power.  Left upper extremity grade 3 x 5 power.  Left lower extremity grade 3 x 5 power. Skin: No rashes, lesions or ulcers Psychiatry: Judgement and insight appear normal. Mood & affect pleasant and appropriate.    The results of significant diagnostics from this hospitalization (including imaging, microbiology, ancillary and laboratory) are listed below for reference.     Microbiology: Recent Results (from the past 240 hour(s))  Resp Panel by RT-PCR (Flu A&B, Covid) Nasopharyngeal Swab     Status: None   Collection Time: 12/27/20  5:49 PM   Specimen: Nasopharyngeal Swab; Nasopharyngeal(NP) swabs in vial transport medium  Result Value Ref Range Status   SARS Coronavirus 2 by RT PCR NEGATIVE NEGATIVE Final    Comment: (NOTE) SARS-CoV-2 target nucleic acids are NOT DETECTED.  The SARS-CoV-2 RNA is generally detectable in upper respiratory specimens during the acute phase of infection. The lowest concentration of SARS-CoV-2 viral copies this assay can detect is 138 copies/mL. A negative result does not preclude SARS-Cov-2 infection and should not be used as the sole basis for treatment or other patient management decisions. A negative result may occur with  improper specimen collection/handling, submission of specimen other than nasopharyngeal swab, presence of viral mutation(s) within the areas targeted by this assay, and inadequate number of viral copies(<138 copies/mL). A negative result must be combined with clinical observations, patient history, and epidemiological information. The expected result is  Negative.  Fact Sheet for Patients:  EntrepreneurPulse.com.au  Fact Sheet for Healthcare Providers:  IncredibleEmployment.be  This test is no t yet approved or cleared by the [place] FDA and  has been authorized for detection and/or diagnosis of SARS-CoV-2 by FDA under an Emergency Use Authorization (EUA). This EUA will remain  in effect (meaning this test can be used) for the duration of the COVID-19 declaration under Section 564(b)(1) of the Act, 21 U.S.C.section 360bbb-3(b)(1), unless the authorization is terminated  or revoked sooner.       Influenza A by PCR NEGATIVE NEGATIVE Final   Influenza B by PCR NEGATIVE NEGATIVE Final    Comment: (NOTE) The Xpert Xpress SARS-CoV-2/FLU/RSV plus assay is intended as an aid in the diagnosis of influenza from Nasopharyngeal swab specimens and should not be used as a sole basis for treatment. Nasal washings and aspirates are unacceptable for Xpert Xpress SARS-CoV-2/FLU/RSV testing.  Fact Sheet for Patients: EntrepreneurPulse.com.au  Fact Sheet for Healthcare Providers: IncredibleEmployment.be  This test is not yet approved or cleared by the [place] FDA and has been authorized for detection and/or diagnosis of SARS-CoV-2 by FDA under an Emergency Use Authorization (EUA). This EUA will remain in effect (meaning this test can be used) for the duration of the COVID-19 declaration under Section 564(b)(1) of the Act, 21 U.S.C. section 360bbb-3(b)(1), unless the authorization is terminated or revoked.  Performed at [facility], [place] [address] [place], [place] [address]   SARS CORONAVIRUS 2 (TAT 6-24 HRS) Nasopharyngeal Nasopharyngeal Swab     Status: None   Collection Time: 01/01/21  1:33 PM   Specimen: Nasopharyngeal Swab  Result Value Ref Range Status   SARS Coronavirus 2 NEGATIVE NEGATIVE Final    Comment: (NOTE) SARS-CoV-2 target nucleic  acids are NOT DETECTED.  The SARS-CoV-2 RNA is generally detectable in upper and lower respiratory specimens during the acute phase of infection. Negative results do not preclude SARS-CoV-2 infection, do not rule out co-infections with other pathogens, and should not be used as the sole basis for treatment or other patient management decisions. Negative results must be combined with clinical observations, patient history, and epidemiological information. The expected result is Negative.  Fact Sheet for Patients: SugarRoll.be  Fact Sheet for Healthcare Providers: https://www.woods-mathews.com/  This test is not yet approved or cleared by the [place] FDA and  has been authorized for detection and/or diagnosis of SARS-CoV-2 by FDA under an Emergency Use Authorization (EUA). This EUA will remain  in effect (meaning this test can be used) for the duration of the COVID-19 declaration under Se ction 564(b)(1) of the Act, 21 U.S.C. section 360bbb-3(b)(1), unless the authorization is terminated or revoked sooner.  Performed at [facility], [place] [address] [place], [place] [address]      Labs: CBC: Recent Labs  Lab 12/27/20 1749 12/27/20 1755 12/29/20 0230  WBC 6.9  --  6.5  NEUTROABS 3.2  --   --   HGB 12.5 13.9 12.3  HCT 39.8 41.0 39.0  MCV 73.8*  --  73.0*  PLT 264  --  867    Basic Metabolic Panel: Recent Labs  Lab 12/27/20 1749 12/27/20 1755 12/29/20 0230 12/30/20 0406 12/31/20 0354  NA 138 140 138 137 138  K 3.5 3.5 3.2* 3.1* 3.6  CL 104 103 104 103 106  CO2 25  --  $R'28 24 23  'aM$ GLUCOSE 143* 143* 126* 135* 144*  BUN $Re'11 12 11 11 10  'Dth$ CREATININE 1.04* 0.90 1.02* 1.00 1.05*  CALCIUM 9.4  --  9.2 9.1 9.2  MG  --   --   --  2.1  --     Liver Function Tests: Recent Labs  Lab 12/27/20 1749  AST 11*  ALT 8  ALKPHOS 66  BILITOT 0.5  PROT 8.1  ALBUMIN 3.5    CBG: Recent Labs  Lab 01/02/21 1637  01/02/21 1714 01/02/21 2109 01/03/21 0603 01/03/21 1117  GLUCAP 157* 152* 152* 133* 171*     Urinalysis    Component Value Date/Time   COLORURINE STRAW (A) 12/27/2020 1930   APPEARANCEUR CLEAR 12/27/2020 1930   LABSPEC 1.023 12/27/2020 1930   PHURINE 8.0 12/27/2020 1930   GLUCOSEU 50 (A) 12/27/2020 1930   HGBUR NEGATIVE 12/27/2020 1930   BILIRUBINUR NEGATIVE 12/27/2020 1930   KETONESUR NEGATIVE 12/27/2020 1930   PROTEINUR 30 (A) 12/27/2020 1930   UROBILINOGEN 0.2 08/28/2014 1143   NITRITE NEGATIVE 12/27/2020 1930  LEUKOCYTESUR NEGATIVE 12/27/2020 1930    I discussed in detail with patient's brother via phone, updated care and answered all questions.  He indicated that he himself has health issues, dialysis patient and will not be able to care for her when she is released from the SNF.  I advised him to coordinate with the SNF transitions of care team.  He verbalized understanding.  Time coordinating discharge: 45 minutes  SIGNED:  Vernell Leep, MD, [place], [facility]  To contact the attending provider between 7A-7P or the covering provider during after hours 7P-7A, please log into the web site [email or web address] and access using universal [place] password for that web site. If you do not have the password, please call the hospital operator.

## 2021-01-03 NOTE — Plan of Care (Signed)
  Problem: Education: Goal: Knowledge of secondary prevention will improve Outcome: Progressing Goal: Knowledge of patient specific risk factors addressed and post discharge goals established will improve Outcome: Progressing   Problem: Self-Care: Goal: Verbalization of feelings and concerns over difficulty with self-care will improve Outcome: Progressing Goal: Ability to communicate needs accurately will improve Outcome: Progressing   Problem: Nutrition: Goal: Dietary intake will improve Outcome: Progressing   Problem: Education: Goal: Knowledge of General Education information will improve Description: Including pain rating scale, medication(s)/side effects and non-pharmacologic comfort measures Outcome: Progressing   Problem: Health Behavior/Discharge Planning: Goal: Ability to manage health-related needs will improve Outcome: Progressing   Problem: Clinical Measurements: Goal: Ability to maintain clinical measurements within normal limits will improve Outcome: Progressing Goal: Will remain free from infection Outcome: Progressing Goal: Diagnostic test results will improve Outcome: Progressing Goal: Respiratory complications will improve Outcome: Progressing Goal: Cardiovascular complication will be avoided Outcome: Progressing   Problem: Activity: Goal: Risk for activity intolerance will decrease Outcome: Progressing   Problem: Nutrition: Goal: Adequate nutrition will be maintained Outcome: Progressing   Problem: Coping: Goal: Level of anxiety will decrease Outcome: Progressing   Problem: Elimination: Goal: Will not experience complications related to bowel motility Outcome: Progressing Goal: Will not experience complications related to urinary retention Outcome: Progressing   Problem: Pain Managment: Goal: General experience of comfort will improve Outcome: Progressing   Problem: Safety: Goal: Ability to remain free from injury will improve Outcome:  Progressing   Problem: Skin Integrity: Goal: Risk for impaired skin integrity will decrease Outcome: Progressing

## 2021-01-03 NOTE — TOC Transition Note (Signed)
Transition of Care [facility]) - CM/SW Discharge Note   Patient Details  Name: Diamond Rice MRN: [id number] Date of Birth: Jan 18, 1971  Transition of Care [facility]) CM/SW Contact:  Geralynn Ochs, LCSW Phone Number: 01/03/2021, 1:15 PM   Clinical Narrative:   Nurse to call report to [phone], Room 21A    Final next level of care: Skilled Nursing Facility Barriers to Discharge: Barriers Resolved   Patient Goals and CMS Choice Patient states their goals for this hospitalization and ongoing recovery are:: to get rehab CMS Medicare.gov Compare Post Acute Care list provided to:: Patient Choice offered to / list presented to : Patient  Discharge Placement              Patient chooses bed at: [facility] Patient to be transferred to facility by: [facility] Name of family member notified: Self Patient and family notified of of transfer: 01/03/21  Discharge Plan and Services     Post Acute Care Choice: [facility]                               Social Determinants of Health (SDOH) Interventions     Readmission Risk Interventions No flowsheet data found.

## 2021-01-03 NOTE — Progress Notes (Signed)
Physical Therapy Treatment Patient Details Name: Diamond Rice MRN: [id number] DOB: 1970-12-23 Today's Date: 01/03/2021    History of Present Illness Pt is a 50 y/o female admitted 5/2 secondary to increased difficulty ambulating and difficulty speaking. MRI of brain showed 4-54mm acute infarct of the Lt hemispheric white matter in teh upper parietal sentrum semiovale region as well as extensive chronic ischemic changes elsewhere throughout the brain.  PMH includes CVA, DM2, HTN, HLD, smoking, prior cocaine abuse, PE (no longer on anticoagulation).    PT Comments    Today's skilled session focused on mobility progression with no issues noted or reported in session. The pt continues to need up to min assist for mobility. Acute PT to continue during pt's hospital stay.   Follow Up Recommendations  SNF;Supervision/Assistance - 24 hour     Equipment Recommendations  None recommended by PT    Precautions / Restrictions Precautions Precautions: Fall    Mobility  Bed Mobility Overal bed mobility: Needs Assistance Bed Mobility: Supine to Sit     Supine to sit: Min guard     General bed mobility comments: min guard for safety, no physical assist required. cues needed on techniuqe and sequencing with rail used and HOB 45 degrees.    Transfers Overall transfer level: Needs assistance Equipment used: Quad cane Transfers: Sit to/from Stand Sit to Stand: Min assist         General transfer comment: min assist to power up from bed in lowest position. cues for anterior weight shifting with pt demo'ing good hand placement.  Ambulation/Gait Ambulation/Gait assistance: Min guard;Min assist Gait Distance (Feet): 6 Feet Assistive device: Quad cane Gait Pattern/deviations: Step-through pattern;Decreased stride length;Decreased stance time - left;Wide base of support Gait velocity: decreased   General Gait Details: pt continues to demo left LE weakness with left foot drag with  advancement. cues needed on posture and sequencing.    Modified Rankin (Stroke Patients Only) Modified Rankin (Stroke Patients Only) Pre-Morbid Rankin Score: Slight disability Modified Rankin: Moderately severe disability     Cognition Arousal/Alertness: Awake/alert Behavior During Therapy: WFL for tasks assessed/performed Overall Cognitive Status: Impaired/Different from baseline Area of Impairment: Memory;Safety/judgement;Awareness;Problem solving                     Memory: Decreased recall of precautions   Safety/Judgement: Decreased awareness of safety;Decreased awareness of deficits   Problem Solving: Slow processing;Requires verbal cues General Comments: Increased time to process and respond due to speech issues. cues needed with mobility for technique/processing/inititation- this is different from outpt rehab mobility/cognition this past January.       Pertinent Vitals/Pain Pain Assessment: No/denies pain Pain Score: 0-No pain     PT Goals (current goals can now be found in the care plan section) Acute Rehab PT Goals Patient Stated Goal: to regain independence PT Goal Formulation: With patient Time For Goal Achievement: 01/11/21 Potential to Achieve Goals: Good Progress towards PT goals: Progressing toward goals    Frequency    Min 3X/week      PT Plan Current plan remains appropriate    AM-PAC PT "6 Clicks" Mobility   Outcome Measure  Help needed turning from your back to your side while in a flat bed without using bedrails?: A Little Help needed moving from lying on your back to sitting on the side of a flat bed without using bedrails?: A Little Help needed moving to and from a bed to a chair (including a wheelchair)?: A Little Help needed  standing up from a chair using your arms (e.g., wheelchair or bedside chair)?: A Little Help needed to walk in hospital room?: A Little Help needed climbing 3-5 steps with a railing? : A Lot 6 Click  Score: 17    End of Session Equipment Utilized During Treatment: Gait belt Activity Tolerance: Patient tolerated treatment well Patient left: in chair;with call bell/phone within reach;with chair alarm set Nurse Communication: Mobility status PT Visit Diagnosis: Unsteadiness on feet (R26.81);Muscle weakness (generalized) (M62.81);Difficulty in walking, not elsewhere classified (R26.2)     Time: 6144-3154 PT Time Calculation (min) (ACUTE ONLY): 13 min  Charges:  $Therapeutic Activity: 8-22 mins                     Willow Ora, PTA, [facility] Acute [employer] Office- [phone] 01/03/21, 11:50 AM  Willow Ora 01/03/2021, 11:50 AM

## 2021-01-04 ENCOUNTER — Emergency Department
Admission: EM | Admit: 2021-01-04 | Discharge: 2021-01-04 | Disposition: A | Discharge status: Home / Self Care | Payer: Medicare Other | Attending: Emergency Medicine | Admitting: Emergency Medicine

## 2021-01-04 ENCOUNTER — Other Ambulatory Visit: Payer: Self-pay

## 2021-01-04 ENCOUNTER — Emergency Department: Payer: Medicare Other

## 2021-01-04 DIAGNOSIS — E1142 Type 2 diabetes mellitus with diabetic polyneuropathy: Secondary | ICD-10-CM | POA: Diagnosis not present

## 2021-01-04 DIAGNOSIS — N183 Chronic kidney disease, stage 3 unspecified: Secondary | ICD-10-CM | POA: Diagnosis not present

## 2021-01-04 DIAGNOSIS — J449 Chronic obstructive pulmonary disease, unspecified: Secondary | ICD-10-CM | POA: Insufficient documentation

## 2021-01-04 DIAGNOSIS — I10 Essential (primary) hypertension: Secondary | ICD-10-CM

## 2021-01-04 DIAGNOSIS — H538 Other visual disturbances: Secondary | ICD-10-CM | POA: Diagnosis present

## 2021-01-04 DIAGNOSIS — Z794 Long term (current) use of insulin: Secondary | ICD-10-CM | POA: Diagnosis not present

## 2021-01-04 DIAGNOSIS — I129 Hypertensive chronic kidney disease with stage 1 through stage 4 chronic kidney disease, or unspecified chronic kidney disease: Secondary | ICD-10-CM | POA: Insufficient documentation

## 2021-01-04 DIAGNOSIS — E1122 Type 2 diabetes mellitus with diabetic chronic kidney disease: Secondary | ICD-10-CM | POA: Diagnosis not present

## 2021-01-04 DIAGNOSIS — Z87891 Personal history of nicotine dependence: Secondary | ICD-10-CM | POA: Diagnosis not present

## 2021-01-04 DIAGNOSIS — D631 Anemia in chronic kidney disease: Secondary | ICD-10-CM | POA: Diagnosis not present

## 2021-01-04 DIAGNOSIS — Z79899 Other long term (current) drug therapy: Secondary | ICD-10-CM | POA: Insufficient documentation

## 2021-01-04 LAB — COMPREHENSIVE METABOLIC PANEL WITH GFR
ALT: 20 U/L (ref 0–44)
AST: 25 U/L (ref 15–41)
Albumin: 3.8 g/dL (ref 3.5–5.0)
Alkaline Phosphatase: 55 U/L (ref 38–126)
Anion gap: 8 (ref 5–15)
BUN: 17 mg/dL (ref 6–20)
CO2: 24 mmol/L (ref 22–32)
Calcium: 9.5 mg/dL (ref 8.9–10.3)
Chloride: 104 mmol/L (ref 98–111)
Creatinine, Ser: 1.03 mg/dL — ABNORMAL HIGH (ref 0.44–1.00)
GFR, Estimated: >60 mL/min
Glucose, Bld: 128 mg/dL — ABNORMAL HIGH (ref 70–99)
Potassium: 3.8 mmol/L (ref 3.5–5.1)
Sodium: 136 mmol/L (ref 135–145)
Total Bilirubin: 0.7 mg/dL (ref 0.3–1.2)
Total Protein: 8 g/dL (ref 6.5–8.1)

## 2021-01-04 LAB — CBC WITH DIFFERENTIAL/PLATELET
Abs Immature Granulocytes: 0.01 K/uL (ref 0.00–0.07)
Basophils Absolute: 0.1 K/uL (ref 0.0–0.1)
Basophils Relative: 1 %
Eosinophils Absolute: 0.6 K/uL — ABNORMAL HIGH (ref 0.0–0.5)
Eosinophils Relative: 8 %
HCT: 37.2 % (ref 36.0–46.0)
Hemoglobin: 11.9 g/dL — ABNORMAL LOW (ref 12.0–15.0)
Immature Granulocytes: 0 %
Lymphocytes Relative: 32 %
Lymphs Abs: 2.4 K/uL (ref 0.7–4.0)
MCH: 22.8 pg — ABNORMAL LOW (ref 26.0–34.0)
MCHC: 32 g/dL (ref 30.0–36.0)
MCV: 71.4 fL — ABNORMAL LOW (ref 80.0–100.0)
Monocytes Absolute: 0.5 K/uL (ref 0.1–1.0)
Monocytes Relative: 7 %
Neutro Abs: 3.8 K/uL (ref 1.7–7.7)
Neutrophils Relative %: 52 %
Platelets: 368 K/uL (ref 150–400)
RBC: 5.21 MIL/uL — ABNORMAL HIGH (ref 3.87–5.11)
RDW: 16 % — ABNORMAL HIGH (ref 11.5–15.5)
WBC: 7.4 K/uL (ref 4.0–10.5)
nRBC: 0 % (ref 0.0–0.2)

## 2021-01-04 LAB — CBG MONITORING, ED: Glucose-Capillary: 117 mg/dL — ABNORMAL HIGH (ref 70–99)

## 2021-01-04 LAB — TROPONIN I (HIGH SENSITIVITY): Troponin I (High Sensitivity): 3 ng/L (ref <18)

## 2021-01-04 NOTE — ED Notes (Signed)
Patient transported to CT 

## 2021-01-04 NOTE — ED Triage Notes (Signed)
Pt BIB EMS from [facility], pt complaint of blurred vision (worse in left) and HTN starting today. Was just discharged from [facility] yesterday for TIA, hx of CVA with deficits to left side and speech. Hx of hypertension, had her normal medications this morning per [facility] staff.

## 2021-01-04 NOTE — ED Provider Notes (Signed)
[facility] Emergency Department Provider Note   ____________________________________________   Event Date/Time   First MD Initiated Contact with Patient 01/04/21 1245     (approximate)  I have reviewed the triage vital signs and the nursing notes.   HISTORY  Chief Complaint Eye Problem and Hypertension    HPI Diamond Rice is a 50 y.o. female with a past medical history of recent TIA and was discharged 2 days prior from [facility] who presents for hypertension and blurred vision.  Patient also has history of CVA with left-sided deficits and slurred speech at baseline.  Patient presents via EMS from [place] complaining of blurred vision that began this morning in addition to hypertension and that she has not taken her medications for until recently.  Patient denies any exacerbating or relieving factors for the symptoms.  Patient states symptoms have been stable since onset.  Patient currently denies any tinnitus, difficulty speaking, facial droop, sore throat, chest pain, shortness of breath, abdominal pain, nausea/vomiting/diarrhea, dysuria, or weakness/numbness/paresthesias in any extremity         Past Medical History:  Diagnosis Date  . Anxiety   . Chronic lower back pain 04/17/2012   "just got over some; catched when I walked"  . COPD (chronic obstructive pulmonary disease) ([place])   . Critical lower limb ischemia ([place]) 05/27/2018  . Depression   . Headache(784.0) 04/17/2012   "~ qod; lately waking up in am w/one"  . High cholesterol   . Hypertension   . Migraines 04/17/2012  . Obesity   . Sleep apnea    CPAP  . Stroke ([place])   . Type II diabetes mellitus ([place]) 08/28/2002    Patient Active Problem List   Diagnosis Date Noted  . Acute ischemic stroke ([place]) 12/27/2020  . Abnormal finding on lung imaging 12/27/2020  . Severe episode of recurrent major depressive disorder, with psychotic features ([place])   . Acute CVA  (cerebrovascular accident) ([place]) 12/06/2019  . AKI (acute kidney injury) ([place]) 12/06/2019  . LVH (left ventricular hypertrophy) 08/10/2019  . Edema of lower extremity 11/26/2018  . Loss of appetite 11/26/2018  . Urinary incontinence 11/26/2018  . Allergic rhinitis 11/14/2018  . Deep venous thrombosis ([employer]) 11/14/2018  . Nicotine dependence 11/14/2018  . Hemorrhagic stroke ([place])   . Diastolic dysfunction   . Type 2 diabetes mellitus ([place])   . Anemia of chronic disease   . Chronic obstructive pulmonary disease ([place])   . ICH (intracerebral hemorrhage) ([place]) 08/30/2018  . Hypertensive heart disease without heart failure 07/04/2018  . Nonrheumatic aortic valve insufficiency 07/04/2018  . Esophageal abnormality   . Class 2 severe obesity due to excess calories with serious comorbidity and body mass index (BMI) of 35.0 to 35.9 in adult [facility])   . Peripheral vascular disease ([place])   . History of osteomyelitis   . History of amputation of lesser toe ([place])   . Pulmonary embolism ([place]) 06/22/2018  . Microcytic anemia 06/17/2018  . At risk for adverse drug reaction 06/11/2018  . Critical lower limb ischemia ([place]) 05/27/2018  . GI bleeding 04/02/2018  . Acute renal failure (ARF) ([place]) 04/02/2018  . Hypokalemia 04/02/2018  . Polysubstance abuse ([place]) 04/02/2018  . Diabetic foot ulcer ([place]) 04/02/2018  . Tinea pedis 03/07/2018  . Diabetic peripheral neuropathy ([place]) 01/24/2018  . Cannabis use disorder, moderate, dependence ([place]) 05/19/2017  . Cocaine use disorder, moderate, dependence ([place]) 05/19/2017  . CKD (chronic kidney disease) stage 3, GFR 30-59 ml/min (HCC)  02/17/2017  . Noncompliance with medications 02/17/2017  . Suicide attempt ([place]) 11/06/2016  . Mild cognitive impairment 03/21/2016  . Moderate episode of recurrent major depressive disorder ([place]) 03/21/2016  . B12 deficiency 03/09/2016  . Iron deficiency anemia 03/07/2016  . History of CVA with residual deficit 12/30/2015  .  Schizoaffective disorder, bipolar type ([place]) 10/07/2015  . OSA (obstructive sleep apnea) 06/09/2015  . Vitamin D deficiency 01/06/2015  . Bilateral knee pain 01/05/2015  . Numbness and tingling in right hand 01/05/2015  . Insomnia 12/07/2014  . Right-sided lacunar infarction ([place]) 09/15/2014  . Left hemiparesis ([place]) 09/15/2014  . Dyspnea   . Back pain 09/11/2013  . Psychoactive substance-induced organic mood disorder ([place]) 05/28/2013  . Cocaine abuse with cocaine-induced mood disorder ([place]) 05/28/2013  . Cannabis dependence with cannabis-induced anxiety disorder ([place]) 05/28/2013  . Morbid obesity with BMI of 45.0-49.9, adult ([place]) 04/25/2012  . Chest pain 04/17/2012  . Hypertension 04/17/2012  . DM (diabetes mellitus) with peripheral vascular complication ([place]) 25/12/3974  . Hyperlipidemia 04/17/2012  . Tobacco use 04/17/2012    Past Surgical History:  Procedure Laterality Date  . ABDOMINAL AORTOGRAM W/LOWER EXTREMITY Left 05/27/2018   Procedure: ABDOMINAL AORTOGRAM W/LOWER EXTREMITY Runoff and Possible Intervention;  Surgeon: Waynetta Sandy, MD;  Location: [place] [facility];  Service: Cardiovascular;  Laterality: Left;  . APPLICATION OF WOUND VAC Left 06/06/2018   Procedure: APPLICATION OF WOUND VAC;  Surgeon: Waynetta Sandy, MD;  Location: [place];  Service: Vascular;  Laterality: Left;  . BALLOON DILATION N/A 12/31/2020   Procedure: BALLOON DILATION;  Surgeon: Ronnette Juniper, MD;  Location: [place];  Service: Gastroenterology;  Laterality: N/A;  . BIOPSY  04/04/2018   Procedure: BIOPSY;  Surgeon: [name], MD;  Location: [facility] ENDOSCOPY;  Service: Endoscopy;;  . BIOPSY  10/27/2020   Procedure: BIOPSY;  Surgeon: Arta Silence, MD;  Location: [facility] ENDOSCOPY;  Service: Endoscopy;;  . BOTOX INJECTION N/A 12/31/2020   Procedure: BOTOX INJECTION;  Surgeon: Ronnette Juniper, MD;  Location: [place];  Service: Gastroenterology;  Laterality: N/A;  . CARDIAC  CATHETERIZATION  ~ 2011  . COLONOSCOPY N/A 04/04/2018   Procedure: COLONOSCOPY;  Surgeon: [name], MD;  Location: [facility] ENDOSCOPY;  Service: Endoscopy;  Laterality: N/A;  . COLONOSCOPY WITH PROPOFOL N/A 10/27/2020   Procedure: COLONOSCOPY WITH PROPOFOL;  Surgeon: Arta Silence, MD;  Location: [facility] ENDOSCOPY;  Service: Endoscopy;  Laterality: N/A;  . ESOPHAGOGASTRODUODENOSCOPY N/A 12/31/2020   Procedure: ESOPHAGOGASTRODUODENOSCOPY (EGD);  Surgeon: Ronnette Juniper, MD;  Location: [place];  Service: Gastroenterology;  Laterality: N/A;  . ESOPHAGOGASTRODUODENOSCOPY (EGD) WITH PROPOFOL N/A 10/27/2020   Procedure: ESOPHAGOGASTRODUODENOSCOPY (EGD) WITH PROPOFOL  WITH POSSIBLE DIL;  Surgeon: Arta Silence, MD;  Location: [facility] ENDOSCOPY;  Service: Endoscopy;  Laterality: N/A;  . FOREIGN BODY REMOVAL  12/31/2020   Procedure: FOREIGN BODY REMOVAL;  Surgeon: Ronnette Juniper, MD;  Location: [place];  Service: Gastroenterology;;  . LOWER EXTREMITY ANGIOGRAM Left 05/27/2018  . PERIPHERAL VASCULAR INTERVENTION Left 05/27/2018   Procedure: PERIPHERAL VASCULAR INTERVENTION;  Surgeon: Waynetta Sandy, MD;  Location: [place] [facility];  Service: Cardiovascular;  Laterality: Left;  . POLYPECTOMY  04/04/2018   Procedure: POLYPECTOMY;  Surgeon: [name], MD;  Location: [facility] ENDOSCOPY;  Service: Endoscopy;;  . POLYPECTOMY  10/27/2020   Procedure: POLYPECTOMY;  Surgeon: Arta Silence, MD;  Location: [facility] ENDOSCOPY;  Service: Endoscopy;;  . TRANSMETATARSAL AMPUTATION Left 05/28/2018   Procedure: AMPUTATION TOES THREE, FOUR AND FIVE on left foot;  Surgeon: Servando Snare  Harrell Gave, MD;  Location: [place];  Service: Vascular;  Laterality: Left;  . WOUND DEBRIDEMENT Left 06/06/2018   Procedure: DEBRIDEMENT WOUND LEFT FOOT;  Surgeon: Waynetta Sandy, MD;  Location: [place];  Service: Vascular;  Laterality: Left;    Prior to Admission medications   Medication Sig Start Date End Date Taking? Authorizing Provider   ACCU-CHEK GUIDE test strip 2 (two) times daily. as directed 02/06/20   [provider]  Accu-Chek Softclix Lancets lancets 2 (two) times daily. 02/06/20   [provider]  amLODipine (NORVASC) 10 MG tablet Take 1 tablet (10 mg total) by mouth daily. 01/22/20   Royal Hawthorn, NP  amoxicillin-clavulanate (AUGMENTIN) 875-125 MG tablet Take 1 tablet by mouth every 12 (twelve) hours. Discontinue after Jan 04, 2021 doses. 01/03/21   Hongalgi, Lenis Dickinson, MD  ARIPiprazole (ABILIFY) 5 MG tablet Take 5 mg by mouth daily. 08/28/20   [provider]  aspirin EC 81 MG EC tablet Take 1 tablet (81 mg total) by mouth daily. Patient not taking: No sig reported 09/21/18   Donzetta Starch, NP  atorvastatin (LIPITOR) 40 MG tablet Take 2 tablets (80 mg total) by mouth daily at 6 PM. 01/03/21   Hongalgi, Lenis Dickinson, MD  Blood Glucose Monitoring Suppl (ACCU-CHEK GUIDE) w/Device KIT USE AS DIRECTED TO CHECK BLOOD SUGAR 02/06/20   [provider]  cloNIDine (CATAPRES) 0.1 MG tablet Take 1 tablet (0.1 mg total) by mouth 2 (two) times daily. 01/03/21   Hongalgi, Lenis Dickinson, MD  hydrALAZINE (APRESOLINE) 100 MG tablet Take 1 tablet (100 mg total) by mouth 3 (three) times daily. 01/22/20   Royal Hawthorn, NP  insulin aspart (NOVOLOG) 100 UNIT/ML injection Inject 0-9 Units into the skin 3 (three) times daily with meals. Correction coverage: Sensitive (thin, NPO, renal) CBG < 70: Implement Hypoglycemia protocol. CBG 70 - 120: 0 units CBG 121 - 150: 1 unit CBG 151 - 200: 2 units CBG 201 - 250: 3 units CBG 251 - 300: 5 units CBG 301 - 350: 7 units CBG 351 - 400: 9 units CBG > 400: call MD. 01/03/21   Modena Jansky, MD  lisinopril (ZESTRIL) 40 MG tablet Take 1 tablet (40 mg total) by mouth daily. 01/22/20   Royal Hawthorn, NP  metoprolol succinate (TOPROL-XL) 50 MG 24 hr tablet Take 50 mg by mouth every morning. 08/02/20   [provider]  mirtazapine (REMERON) 15 MG tablet Take 1 tablet (15 mg  total) by mouth at bedtime. For Depression 01/22/20   Royal Hawthorn, NP  pantoprazole (PROTONIX) 40 MG tablet Take 1 tablet (40 mg total) by mouth daily. 01/03/21   Hongalgi, Lenis Dickinson, MD    Allergies Morphine and related and Metformin  Family History  Problem Relation Age of Onset  . Stroke Brother 24  . Stroke Brother 85  . Heart attack Mother 2  . Stroke Father 20    Social History Social History   Tobacco Use  . Smoking status: Former Smoker    Packs/day: 0.25    Years: 28.00    Pack years: 7.00    Types: Cigarettes  . Smokeless tobacco: Never Used  Vaping Use  . Vaping Use: Never used  Substance Use Topics  . Alcohol use: No    Alcohol/week: 0.0 standard drinks    Comment: rare  . Drug use: Yes    Types: Marijuana, "Crack" cocaine    Comment: marijuana last use days ago; last cocaine several months ago  Review of Systems Constitutional: No fever/chills Eyes: Endorses blurred vision to the left side of her visual field ENT: No sore throat. Cardiovascular: Denies chest pain. Respiratory: Denies shortness of breath. Gastrointestinal: No abdominal pain.  No nausea, no vomiting.  No diarrhea. Genitourinary: Negative for dysuria. Musculoskeletal: Negative for acute arthralgias Skin: Negative for rash. Neurological: Negative for headaches, weakness/numbness/paresthesias in any extremity Psychiatric: Negative for suicidal ideation/homicidal ideation   ____________________________________________   PHYSICAL EXAM:  VITAL SIGNS: ED Triage Vitals  Enc Vitals Group     BP 01/04/21 1248 (!) 166/102     Pulse Rate 01/04/21 1242 82     Resp 01/04/21 1242 18     Temp 01/04/21 1242 98.5 F (36.9 C)     Temp Source 01/04/21 1242 Oral     SpO2 01/04/21 1242 100 %     Weight 01/04/21 1244 188 lb 7.9 oz (85.5 kg)     Height 01/04/21 1244 _0  (1.549 m)     Head Circumference --      Peak Flow --      Pain Score 01/04/21 1244 0     Pain Loc --      Pain Edu? --       Excl. in [place]? --    Constitutional: Alert and oriented. Well appearing and in no acute distress. Eyes: Conjunctivae are normal. PERRL.  Subjective decreased visual acuity in the left visual fields of both eyes Head: Atraumatic. Nose: No congestion/rhinnorhea. Mouth/Throat: Mucous membranes are moist. Neck: No stridor Cardiovascular: Grossly normal heart sounds.  Good peripheral circulation. Respiratory: Normal respiratory effort.  No retractions. Gastrointestinal: Soft and nontender. No distention. Musculoskeletal: No obvious deformities Neurologic:  Normal speech and language. No gross focal neurologic deficits are appreciated. Skin:  Skin is warm and dry. No rash noted. Psychiatric: Mood and affect are normal. Speech and behavior are normal.  ____________________________________________   LABS (all labs ordered are listed, but only abnormal results are displayed)  Labs Reviewed  CBC WITH DIFFERENTIAL/PLATELET - Abnormal; Notable for the following components:      Result Value   RBC 5.21 (*)    Hemoglobin 11.9 (*)    MCV 71.4 (*)    MCH 22.8 (*)    RDW 16.0 (*)    Eosinophils Absolute 0.6 (*)    All other components within normal limits  COMPREHENSIVE METABOLIC PANEL - Abnormal; Notable for the following components:   Glucose, Bld 128 (*)    Creatinine, Ser 1.03 (*)    All other components within normal limits  CBG MONITORING, ED - Abnormal; Notable for the following components:   Glucose-Capillary 117 (*)    All other components within normal limits  URINALYSIS, COMPLETE (UACMP) WITH MICROSCOPIC  TROPONIN I (HIGH SENSITIVITY)   ____________________________________________  EKG  ED ECG REPORT I, Naaman Plummer, the attending physician, personally viewed and interpreted this ECG.  Date: 01/04/2021 EKG Time: 1244 Rate: 80 Rhythm: normal sinus rhythm QRS Axis: normal Intervals: normal ST/T Wave abnormalities: normal Narrative Interpretation: no evidence of  acute ischemia  ____________________________________________  RADIOLOGY  ED MD interpretation: CT of the head without contrast shows no evidence of acute abnormalities including no intracerebral hemorrhage, obvious masses, or significant edema.  There does show evidence of chronic patchy periventricular small vessel disease as well as prior small infarcts in the left thalamus and left cerebellum, and left cerebellar dentate nucleus  Official radiology report(s): CT Head Wo Contrast  Result Date: 01/04/2021 CLINICAL DATA:  Hypertension with blurred  vision EXAM: CT HEAD WITHOUT CONTRAST TECHNIQUE: Contiguous axial images were obtained from the base of the skull through the vertex without intravenous contrast. COMPARISON:  Head CT Dec 27, 2020; brain MRI Dec 30, 2020 FINDINGS: Brain: Ventricles and sulci are normal in size and configuration. There is no intracranial mass, hemorrhage, extra-axial fluid collection, or midline shift. There is small vessel disease in the centra semiovale at multiple sites, stable. There is evidence of a prior infarct in the left thalamus. There is evidence of a prior infarct in the mid left cerebellum adjacent to the posterior aspect of the left dentate nucleus. No acute infarct is evident on this study. Vascular: No hyperdense vessel. Foci of calcification noted in each carotid siphon. Skull: The bony calvarium appears intact. Sinuses/Orbits: There is mucosal thickening in several ethmoid air cells as well as in each anterior sphenoid sinus region. Orbits appear symmetric bilaterally. Other: Mastoid air cells are clear. IMPRESSION: Patchy periventricular small vessel disease, stable. Prior small infarct left thalamus. Prior small infarct left cerebellum immediately lateral to the posterior aspect of the left cerebellar dentate nucleus. No acute infarct evident on this study. No appreciable mass or hemorrhage. There are foci of arterial vascular calcification. There is mucosal  thickening in several paranasal sinuses. Electronically Signed   By: Lowella Grip III M.D.   On: 01/04/2021 14:49    ____________________________________________   PROCEDURES  Procedure(s) performed (including Critical Care):  .1-3 Lead EKG Interpretation Performed by: Naaman Plummer, MD Authorized by: Naaman Plummer, MD     Interpretation: normal     ECG rate:  69   ECG rate assessment: normal     Rhythm: sinus rhythm     Ectopy: none     Conduction: normal       ____________________________________________   INITIAL IMPRESSION / ASSESSMENT AND PLAN / ED COURSE  As part of my medical decision making, I reviewed the following data within the [place] notes reviewed and incorporated, Labs reviewed, EKG interpreted, Old chart reviewed, Radiograph reviewed and Notes from prior ED visits reviewed and incorporated        Presents to the emergency department complaining of high blood pressure. Patient is otherwise asymptomatic without confusion, chest pain, hematuria, or SOB. + Nonadherence to antihypertensive regimen DDx: CV, AMI, heart failure, renal infarction or failure or other end organ damage. Patient symptoms significantly deteriorated with blood pressure control. Disposition: Discussed with patient their elevated blood pressure and need for close outpatient management of their hypertension. Will provide a prescription for the patients previous antihypertensive medication as needed and arrange for the patient to follow up in a primary care clinic       ____________________________________________   FINAL CLINICAL IMPRESSION(S) / ED DIAGNOSES  Final diagnoses:  Primary hypertension  Blurred vision     ED Discharge Orders    None       Note:  This document was prepared using Dragon voice recognition software and may include unintentional dictation errors.   Naaman Plummer, MD 01/04/21 [phone]

## 2021-01-04 NOTE — ED Notes (Signed)
[facility] called for transport back to [facility]

## 2021-01-11 ENCOUNTER — Emergency Department
Admission: EM | Admit: 2021-01-11 | Discharge: 2021-01-12 | Disposition: A | Discharge status: Home / Self Care | Payer: Medicare Other | Attending: Emergency Medicine | Admitting: Emergency Medicine

## 2021-01-11 ENCOUNTER — Encounter: Payer: Self-pay | Admitting: Emergency Medicine

## 2021-01-11 ENCOUNTER — Other Ambulatory Visit: Payer: Self-pay

## 2021-01-11 ENCOUNTER — Emergency Department: Payer: Medicare Other

## 2021-01-11 DIAGNOSIS — R9431 Abnormal electrocardiogram [ECG] [EKG]: Secondary | ICD-10-CM | POA: Diagnosis not present

## 2021-01-11 DIAGNOSIS — R079 Chest pain, unspecified: Secondary | ICD-10-CM | POA: Diagnosis not present

## 2021-01-11 DIAGNOSIS — R1032 Left lower quadrant pain: Secondary | ICD-10-CM | POA: Diagnosis not present

## 2021-01-11 DIAGNOSIS — Z794 Long term (current) use of insulin: Secondary | ICD-10-CM | POA: Diagnosis not present

## 2021-01-11 DIAGNOSIS — J410 Simple chronic bronchitis: Secondary | ICD-10-CM | POA: Diagnosis not present

## 2021-01-11 DIAGNOSIS — Z6835 Body mass index (BMI) 35.0-35.9, adult: Secondary | ICD-10-CM | POA: Diagnosis not present

## 2021-01-11 DIAGNOSIS — F250 Schizoaffective disorder, bipolar type: Secondary | ICD-10-CM | POA: Diagnosis not present

## 2021-01-11 DIAGNOSIS — E1140 Type 2 diabetes mellitus with diabetic neuropathy, unspecified: Secondary | ICD-10-CM | POA: Insufficient documentation

## 2021-01-11 DIAGNOSIS — N183 Chronic kidney disease, stage 3 unspecified: Secondary | ICD-10-CM | POA: Diagnosis not present

## 2021-01-11 DIAGNOSIS — E669 Obesity, unspecified: Secondary | ICD-10-CM | POA: Diagnosis not present

## 2021-01-11 DIAGNOSIS — U071 COVID-19: Secondary | ICD-10-CM | POA: Insufficient documentation

## 2021-01-11 DIAGNOSIS — E1122 Type 2 diabetes mellitus with diabetic chronic kidney disease: Secondary | ICD-10-CM | POA: Insufficient documentation

## 2021-01-11 DIAGNOSIS — Z79899 Other long term (current) drug therapy: Secondary | ICD-10-CM | POA: Insufficient documentation

## 2021-01-11 DIAGNOSIS — I129 Hypertensive chronic kidney disease with stage 1 through stage 4 chronic kidney disease, or unspecified chronic kidney disease: Secondary | ICD-10-CM | POA: Diagnosis not present

## 2021-01-11 DIAGNOSIS — Z87891 Personal history of nicotine dependence: Secondary | ICD-10-CM | POA: Insufficient documentation

## 2021-01-11 DIAGNOSIS — Z7982 Long term (current) use of aspirin: Secondary | ICD-10-CM | POA: Insufficient documentation

## 2021-01-11 DIAGNOSIS — R1012 Left upper quadrant pain: Secondary | ICD-10-CM

## 2021-01-11 LAB — CBC WITH DIFFERENTIAL/PLATELET
Abs Immature Granulocytes: 0.12 10*3/uL — ABNORMAL HIGH (ref 0.00–0.07)
Basophils Absolute: 0 10*3/uL (ref 0.0–0.1)
Basophils Relative: 0 %
Eosinophils Absolute: 0 10*3/uL (ref 0.0–0.5)
Eosinophils Relative: 0 %
HCT: 33.7 % — ABNORMAL LOW (ref 36.0–46.0)
Hemoglobin: 10.6 g/dL — ABNORMAL LOW (ref 12.0–15.0)
Immature Granulocytes: 1 %
Lymphocytes Relative: 41 %
Lymphs Abs: 4.3 10*3/uL — ABNORMAL HIGH (ref 0.7–4.0)
MCH: 22.7 pg — ABNORMAL LOW (ref 26.0–34.0)
MCHC: 31.5 g/dL (ref 30.0–36.0)
MCV: 72.3 fL — ABNORMAL LOW (ref 80.0–100.0)
Monocytes Absolute: 1.1 10*3/uL — ABNORMAL HIGH (ref 0.1–1.0)
Monocytes Relative: 10 %
Neutro Abs: 5.1 10*3/uL (ref 1.7–7.7)
Neutrophils Relative %: 48 %
Platelets: 362 10*3/uL (ref 150–400)
RBC: 4.66 MIL/uL (ref 3.87–5.11)
RDW: 16.1 % — ABNORMAL HIGH (ref 11.5–15.5)
Smear Review: NORMAL
WBC: 10.5 10*3/uL (ref 4.0–10.5)
nRBC: 0 % (ref 0.0–0.2)

## 2021-01-11 LAB — COMPREHENSIVE METABOLIC PANEL
ALT: 16 U/L (ref 0–44)
AST: 18 U/L (ref 15–41)
Albumin: 3.5 g/dL (ref 3.5–5.0)
Alkaline Phosphatase: 66 U/L (ref 38–126)
Anion gap: 7 (ref 5–15)
BUN: 24 mg/dL — ABNORMAL HIGH (ref 6–20)
CO2: 27 mmol/L (ref 22–32)
Calcium: 9.4 mg/dL (ref 8.9–10.3)
Chloride: 100 mmol/L (ref 98–111)
Creatinine, Ser: 1.11 mg/dL — ABNORMAL HIGH (ref 0.44–1.00)
GFR, Estimated: >60 mL/min (ref >60)
Glucose, Bld: 200 mg/dL — ABNORMAL HIGH (ref 70–99)
Potassium: 3.8 mmol/L (ref 3.5–5.1)
Sodium: 134 mmol/L — ABNORMAL LOW (ref 135–145)
Total Bilirubin: 0.6 mg/dL (ref 0.3–1.2)
Total Protein: 7.9 g/dL (ref 6.5–8.1)

## 2021-01-11 LAB — TROPONIN I (HIGH SENSITIVITY)
Troponin I (High Sensitivity): 3 ng/L (ref <18)
Troponin I (High Sensitivity): <2 ng/L (ref <18)

## 2021-01-11 LAB — LIPASE, BLOOD: Lipase: 32 U/L (ref 11–51)

## 2021-01-11 NOTE — Consult Note (Signed)
Cardiology Consultation:   Patient ID: Diamond Rice MRN: [id number]; DOB: [birth year]  Admit date: 01/11/2021 Date of Consult: 01/11/2021  PCP:  [name], MD   [facility] Providers Cardiologist:  Minus Breeding, MD      Patient Profile:   Diamond Rice is a 50 y.o. female with a hx of recurrent strokes and recent hospitalization for acute ischemic stroke, hypertension, hyperlipidemia, type 2 diabetes mellitus, pulmonary embolism (no longer on anticoagulation) polysubstance abuse (tobacco, cocaine, and marijuana), and medication noncompliance) who is being seen 01/11/2021 for the evaluation of chest pain and abnormal EKG at the request of Dr. Joni Fears.  History of Present Illness:   Diamond Rice the patient was hospitalized at [facility] from 5/2 - 01/03/2021 for bilateral leg pain and dysarthria in the setting of marked hypertension.  It was felt that her recurrent stroke was secondary to small vessel ischemic disease in the setting of uncontrolled hypertension.  Brain MRI showed a 4 to 5 mm area of acute infarction in the left hemispheric white matter of the upper parietal centrum ovale region.  During the hospitalization, she complained of dysphagia and underwent EGD with esophageal dilation and Botox injection for achalasia.  She was transferred to rehab, where she had been residing since discharge.  Of note, she was brought to the [facility] emergency department the day after discharge due to uncontrolled blood pressure complicated by blurry vision.  This afternoon, Diamond Rice developed substernal chest pressure while at rest.  She cannot describe it further but reports that she has not felt this before.  She denies shortness of breath, palpitations, or lightheadedness.  She has chronic left-sided deficits from her prior hemorrhagic stroke.  When EMS arrived, the patient received aspirin, sublingual nitroglycerin x2, and fentanyl with complete resolution of her pain.  At this time,  Diamond Rice is without chest pain or other symptoms.   Past Medical History:  Diagnosis Date  . Anxiety   . Chronic lower back pain 04/17/2012   "just got over some; catched when I walked"  . COPD (chronic obstructive pulmonary disease) ([place])   . Critical lower limb ischemia ([place]) 05/27/2018  . Depression   . Headache(784.0) 04/17/2012   "~ qod; lately waking up in am w/one"  . High cholesterol   . Hypertension   . Migraines 04/17/2012  . Obesity   . Sleep apnea    CPAP  . Stroke ([place])   . Type II diabetes mellitus ([place]) 08/28/2002    Past Surgical History:  Procedure Laterality Date  . ABDOMINAL AORTOGRAM W/LOWER EXTREMITY Left 05/27/2018   Procedure: ABDOMINAL AORTOGRAM W/LOWER EXTREMITY Runoff and Possible Intervention;  Surgeon: Waynetta Sandy, MD;  Location: [place] [facility];  Service: Cardiovascular;  Laterality: Left;  . APPLICATION OF WOUND VAC Left 06/06/2018   Procedure: APPLICATION OF WOUND VAC;  Surgeon: Waynetta Sandy, MD;  Location: [place];  Service: Vascular;  Laterality: Left;  . BALLOON DILATION N/A 12/31/2020   Procedure: BALLOON DILATION;  Surgeon: Ronnette Juniper, MD;  Location: [place];  Service: Gastroenterology;  Laterality: N/A;  . BIOPSY  04/04/2018   Procedure: BIOPSY;  Surgeon: [name], MD;  Location: [facility] ENDOSCOPY;  Service: Endoscopy;;  . BIOPSY  10/27/2020   Procedure: BIOPSY;  Surgeon: Arta Silence, MD;  Location: [facility] ENDOSCOPY;  Service: Endoscopy;;  . BOTOX INJECTION N/A 12/31/2020   Procedure: BOTOX INJECTION;  Surgeon: Ronnette Juniper, MD;  Location: [place];  Service: Gastroenterology;  Laterality: N/A;  .  CARDIAC CATHETERIZATION  ~ 2011  . COLONOSCOPY N/A 04/04/2018   Procedure: COLONOSCOPY;  Surgeon: [name], MD;  Location: [facility] ENDOSCOPY;  Service: Endoscopy;  Laterality: N/A;  . COLONOSCOPY WITH PROPOFOL N/A 10/27/2020   Procedure: COLONOSCOPY WITH PROPOFOL;  Surgeon: Arta Silence, MD;  Location: [facility] ENDOSCOPY;   Service: Endoscopy;  Laterality: N/A;  . ESOPHAGOGASTRODUODENOSCOPY N/A 12/31/2020   Procedure: ESOPHAGOGASTRODUODENOSCOPY (EGD);  Surgeon: Ronnette Juniper, MD;  Location: [place];  Service: Gastroenterology;  Laterality: N/A;  . ESOPHAGOGASTRODUODENOSCOPY (EGD) WITH PROPOFOL N/A 10/27/2020   Procedure: ESOPHAGOGASTRODUODENOSCOPY (EGD) WITH PROPOFOL  WITH POSSIBLE DIL;  Surgeon: Arta Silence, MD;  Location: [facility] ENDOSCOPY;  Service: Endoscopy;  Laterality: N/A;  . FOREIGN BODY REMOVAL  12/31/2020   Procedure: FOREIGN BODY REMOVAL;  Surgeon: Ronnette Juniper, MD;  Location: [place];  Service: Gastroenterology;;  . LOWER EXTREMITY ANGIOGRAM Left 05/27/2018  . PERIPHERAL VASCULAR INTERVENTION Left 05/27/2018   Procedure: PERIPHERAL VASCULAR INTERVENTION;  Surgeon: Waynetta Sandy, MD;  Location: [place] [facility];  Service: Cardiovascular;  Laterality: Left;  . POLYPECTOMY  04/04/2018   Procedure: POLYPECTOMY;  Surgeon: [name], MD;  Location: [facility] ENDOSCOPY;  Service: Endoscopy;;  . POLYPECTOMY  10/27/2020   Procedure: POLYPECTOMY;  Surgeon: Arta Silence, MD;  Location: [facility] ENDOSCOPY;  Service: Endoscopy;;  . TRANSMETATARSAL AMPUTATION Left 05/28/2018   Procedure: AMPUTATION TOES THREE, FOUR AND FIVE on left foot;  Surgeon: Waynetta Sandy, MD;  Location: [place];  Service: Vascular;  Laterality: Left;  . WOUND DEBRIDEMENT Left 06/06/2018   Procedure: DEBRIDEMENT WOUND LEFT FOOT;  Surgeon: Waynetta Sandy, MD;  Location: [place];  Service: Vascular;  Laterality: Left;     Home Medications:  Prior to Admission medications   Medication Sig Start Date Annalei Friesz Date Taking? Authorizing Provider  ACCU-CHEK GUIDE test strip 2 (two) times daily. as directed 02/06/20   [provider]  Accu-Chek Softclix Lancets lancets 2 (two) times daily. 02/06/20   [provider]  amLODipine (NORVASC) 10 MG tablet Take 1 tablet (10 mg total) by mouth daily. 01/22/20   Royal Hawthorn, NP  amoxicillin-clavulanate (AUGMENTIN) 875-125 MG tablet Take 1 tablet by mouth every 12 (twelve) hours. Discontinue after Jan 04, 2021 doses. 01/03/21   Hongalgi, Lenis Dickinson, MD  ARIPiprazole (ABILIFY) 5 MG tablet Take 5 mg by mouth daily. 08/28/20   [provider]  aspirin EC 81 MG EC tablet Take 1 tablet (81 mg total) by mouth daily. Patient not taking: No sig reported 09/21/18   Donzetta Starch, NP  atorvastatin (LIPITOR) 40 MG tablet Take 2 tablets (80 mg total) by mouth daily at 6 PM. 01/03/21   Hongalgi, Lenis Dickinson, MD  Blood Glucose Monitoring Suppl (ACCU-CHEK GUIDE) w/Device KIT USE AS DIRECTED TO CHECK BLOOD SUGAR 02/06/20   [provider]  cloNIDine (CATAPRES) 0.1 MG tablet Take 1 tablet (0.1 mg total) by mouth 2 (two) times daily. 01/03/21   Hongalgi, Lenis Dickinson, MD  hydrALAZINE (APRESOLINE) 100 MG tablet Take 1 tablet (100 mg total) by mouth 3 (three) times daily. 01/22/20   Royal Hawthorn, NP  insulin aspart (NOVOLOG) 100 UNIT/ML injection Inject 0-9 Units into the skin 3 (three) times daily with meals. Correction coverage: Sensitive (thin, NPO, renal) CBG < 70: Implement Hypoglycemia protocol. CBG 70 - 120: 0 units CBG 121 - 150: 1 unit CBG 151 - 200: 2 units CBG 201 - 250: 3 units CBG 251 - 300: 5 units CBG 301 - 350: 7 units CBG  351 - 400: 9 units CBG > 400: call MD. 01/03/21   Modena Jansky, MD  lisinopril (ZESTRIL) 40 MG tablet Take 1 tablet (40 mg total) by mouth daily. 01/22/20   Royal Hawthorn, NP  metoprolol succinate (TOPROL-XL) 50 MG 24 hr tablet Take 50 mg by mouth every morning. 08/02/20   [provider]  mirtazapine (REMERON) 15 MG tablet Take 1 tablet (15 mg total) by mouth at bedtime. For Depression 01/22/20   Royal Hawthorn, NP  pantoprazole (PROTONIX) 40 MG tablet Take 1 tablet (40 mg total) by mouth daily. 01/03/21   Modena Jansky, MD    Inpatient Medications: Scheduled Meds:  Continuous Infusions:  PRN Meds:   Allergies:     Allergies  Allergen Reactions  . Morphine And Related Hives  . Metformin Diarrhea and Other (See Comments)    "Allergic," per [facility]    Social History:   Social History   Socioeconomic History  . Marital status: Single    Spouse name: Not on file  . Number of children: Not on file  . Years of education: Not on file  . Highest education level: Not on file  Occupational History  . Occupation: disabled  Tobacco Use  . Smoking status: Former Smoker    Packs/day: 0.25    Years: 28.00    Pack years: 7.00    Types: Cigarettes  . Smokeless tobacco: Never Used  Vaping Use  . Vaping Use: Never used  Substance and Sexual Activity  . Alcohol use: No    Alcohol/week: 0.0 standard drinks    Comment: rare  . Drug use: Yes    Types: Marijuana, "Crack" cocaine    Comment: marijuana last use days ago; last cocaine several months ago  . Sexual activity: Not Currently    Birth control/protection: None  Other Topics Concern  . Not on file  Social History Narrative   Was a  Quarry manager at [employer]. Lives with godmother.     Social Determinants of Health   Financial Resource Strain: Not on file  Food Insecurity: Not on file  Transportation Needs: Not on file  Physical Activity: Not on file  Stress: Not on file  Social Connections: Not on file  Intimate Partner Violence: Not on file    Family History:   Family History  Problem Relation Age of Onset  . Stroke Brother 28  . Stroke Brother 60  . Heart attack Mother 63  . Stroke Father 29     ROS:  Please see the history of present illness. All other ROS reviewed and negative.     Physical Exam/Data:   Vitals:   01/11/21 1706 01/11/21 1708 01/11/21 1730  BP:  (!) 156/97 (!) 148/94  Pulse:  69 67  Resp:  13 (!) 23  Temp:  (!) 97.5 F (36.4 C)   TempSrc:  Axillary   SpO2:  100% 100%  Weight: 85.5 kg    Height: $Remove'5\' 1"'ryiwKEM$  (1.549 m)     No intake or output data in the 24 hours ending 01/11/21 1754 Last 3 Weights 01/11/2021 01/04/2021  12/31/2020  Weight (lbs) 188 lb 7.9 oz 188 lb 7.9 oz 169 lb 12.1 oz  Weight (kg) 85.5 kg 85.5 kg 77 kg  Some encounter information is confidential and restricted. Go to Review Flowsheets activity to see all data.     Body mass index is 35.62 kg/m.  General: Somnolent and chronically ill-appearing woman, lying on stretcher. HEENT: normal Lymph: no adenopathy Neck: no  JVD Endocrine:  No thryomegaly Vascular: No carotid bruits; 2+ radial pulses bilaterally. Cardiac:  normal S1, S2; RRR; no murmurs, rubs, or gallops. Lungs: Patient unable to sit up for posterior auscultation.  Lungs are clear anteriorly with poor inspiratory effort. Abd: soft, nontender, no hepatomegaly  Ext: no edema Musculoskeletal:  No deformities, patient is able to move all 4 extremities but is too lethargic to assess strength. Skin: warm and dry  Neuro:  CNs 3 through 12 grossly intact.  Patient is somnolent. Psych: Somnolent with flat affect.  EKG:  The EKG was personally reviewed and demonstrates: Normal sinus rhythm with LVH and 1 to 2 mm ST elevation in V2 and V3.  ST elevation is slightly more pronounced compared with prior tracing from 01/04/2021 but is similar to the tracing from 03/09/2020 in regard to ST segment abnormalities.  Telemetry:  None  Relevant CV Studies: TTE (12/28/2020): 1. Left ventricular ejection fraction, by estimation, is 60 to 65%. The  left ventricle has normal function. The left ventricle has no regional  wall motion abnormalities. There is moderate left ventricular hypertrophy.  Left ventricular diastolic  parameters are consistent with Grade I diastolic dysfunction (impaired  relaxation).  2. Right ventricular systolic function is normal. The right ventricular  size is normal.  3. Left atrial size was mildly dilated.  4. The mitral valve is normal in structure. No evidence of mitral valve  regurgitation. No evidence of mitral stenosis.  5. Left coronary cusp calcification. The  aortic valve is tricuspid. There  is mild calcification of the aortic valve. There is mild thickening of the  aortic valve. Aortic valve regurgitation is moderate to severe. No aortic  stenosis is present.  6. The inferior vena cava is normal in size with greater than 50%  respiratory variability, suggesting right atrial pressure of 3 mmHg.  Laboratory Data:  High Sensitivity Troponin:   Recent Labs  Lab 01/04/21 1245 01/11/21 1709  TROPONINIHS 3 3     Chemistry Recent Labs  Lab 01/11/21 1709  NA 134*  K 3.8  CL 100  CO2 27  GLUCOSE 200*  BUN 24*  CREATININE 1.11*  CALCIUM 9.4  GFRNONAA >60  ANIONGAP 7    Recent Labs  Lab 01/11/21 1709  PROT 7.9  ALBUMIN 3.5  AST 18  ALT 16  ALKPHOS 66  BILITOT 0.6   Hematology Recent Labs  Lab 01/11/21 1709  WBC 10.5  RBC 4.66  HGB 10.6*  HCT 33.7*  MCV 72.3*  MCH 22.7*  MCHC 31.5  RDW 16.1*  PLT 362   BNPNo results for input(s): BNP, PROBNP in the last 168 hours.  DDimer No results for input(s): DDIMER in the last 168 hours.   Radiology/Studies:  DG Chest Portable 1 View  Result Date: 01/11/2021 CLINICAL DATA:  Recent diagnosis of COVID with cough. EXAM: PORTABLE CHEST 1 VIEW COMPARISON:  Dec 27, 2020 FINDINGS: Mild linear atelectasis is seen along the lateral aspect of the mid left lung. In ill-defined subcentimeter nodular opacity is seen overlying the right lung base. This is not clearly identified on the prior study. There is no evidence of acute infiltrate, pleural effusion or pneumothorax. The cardiac silhouette is borderline in size. The visualized skeletal structures are unremarkable. IMPRESSION: No acute or active cardiopulmonary disease. Electronically Signed   By: Virgina Norfolk M.D.   On: 01/11/2021 17:37     Assessment and Plan:   Chest pain and abnormal EKG: Patient presents with acute onset of chest  pain at rest relieved with nitroglycerin and fentanyl.  She has multiple cardiac risk factors  including recurrent strokes, hypertension, hyperlipidemia, diabetes mellitus, and polysubstance abuse.  She is currently pain-free.  EKG shows normal sinus rhythm with LVH and ST elevation in V2 and V3 that is more pronounced than on her most recent prior tracing but is similar to some tracings including 03/09/2020.  She also has lateral T wave inversions.  I suspect that her ST/T changes seen today are most consistent with abnormal repolarization in the setting of her LVH and uncontrolled hypertension.  I have performed a bedside echocardiogram in the ED today, which shows normal LVEF without anterior hypokinesis.  I do not believe that this represents a STEMI.  Additionally, I think the risks of emergent cardiac catheterization without definitive evidence of ongoing acute MI outweigh the benefits, given the patient's history of uncontrolled hypertension, recurrent strokes (including hemorrhagic stroke), polysubstance abuse, and repeated medication nonadherence.  Trend troponin.  Defer adding heparin unless there is objective evidence of ongoing ischemia (chest pain with dynamic EKG changes or significant bump in troponin).  Continue aspirin and atorvastatin.  Hypertension: Blood pressure poorly controlled in the ED today.  Continue home regimen of amlodipine, clonidine, hydralazine, lisinopril, and metoprolol.  History of strokes: No new deficits reported by the patient or EMS.  Continue blood pressure and lipid control as well as antiplatelet therapy.  Polysubstance abuse: Patient with history of tobacco, marijuana, and cocaine use.  She denies using illicit drugs since her last discharge.  Check urine drug screen.   Risk Assessment/Risk Scores:     HEART Score (for undifferentiated chest pain):  HEART Score: 5{  For questions or updates, please contact [place] Please consult [email or web address] for contact info under [facility]iology.   Signed, Nelva Bush, MD  01/11/2021 5:54  PM

## 2021-01-11 NOTE — Progress Notes (Signed)
Code stroke chaplain offered prayer emotional support

## 2021-01-11 NOTE — ED Provider Notes (Signed)
[facility] Emergency Department Provider Note  ____________________________________________  Time seen: Approximately 510 PM  I have reviewed the triage vital signs and the nursing notes.   HISTORY  Chief Complaint Code STEMI  Level 5 Caveat: Portions of the History and Physical including HPI and review of systems are unable to be completely obtained due to patient being a poor historian    HPI Diamond Rice is a 50 y.o. female with a history of  COPD,  diabetes, hypertension who recently had a stroke and is [place] for rehab who developed chest pain today.  Started at about 4:30 PM, lasted 10 or 20 minutes and resolved prior to arrival in the ED.  Patient also was recently diagnosed with COVID about 3 days ago.  She has nonproductive cough.  Patient was given aspirin by EMS.  Code STEMI was activated by EMS due to ST elevation in V2 through V4.     Past Medical History:  Diagnosis Date  . Anxiety   . Chronic lower back pain 04/17/2012   "just got over some; catched when I walked"  . COPD (chronic obstructive pulmonary disease) ([place])   . Critical lower limb ischemia ([place]) 05/27/2018  . Depression   . Headache(784.0) 04/17/2012   "~ qod; lately waking up in am w/one"  . High cholesterol   . Hypertension   . Migraines 04/17/2012  . Obesity   . Sleep apnea    CPAP  . Stroke ([place])   . Type II diabetes mellitus ([place]) 08/28/2002     Patient Active Problem List   Diagnosis Date Noted  . Acute ischemic stroke ([place]) 12/27/2020  . Abnormal finding on lung imaging 12/27/2020  . Severe episode of recurrent major depressive disorder, with psychotic features ([place])   . Acute CVA (cerebrovascular accident) ([place]) 12/06/2019  . AKI (acute kidney injury) ([place]) 12/06/2019  . LVH (left ventricular hypertrophy) 08/10/2019  . Edema of lower extremity 11/26/2018  . Loss of appetite 11/26/2018  . Urinary incontinence 11/26/2018  . Allergic rhinitis  11/14/2018  . Deep venous thrombosis ([place]) 11/14/2018  . Nicotine dependence 11/14/2018  . Hemorrhagic stroke ([place])   . Diastolic dysfunction   . Type 2 diabetes mellitus ([place])   . Anemia of chronic disease   . Chronic obstructive pulmonary disease ([place])   . ICH (intracerebral hemorrhage) ([place]) 08/30/2018  . Hypertensive heart disease without heart failure 07/04/2018  . Nonrheumatic aortic valve insufficiency 07/04/2018  . Esophageal abnormality   . Class 2 severe obesity due to excess calories with serious comorbidity and body mass index (BMI) of 35.0 to 35.9 in adult [facility])   . Peripheral vascular disease ([place])   . History of osteomyelitis   . History of amputation of lesser toe ([place])   . Pulmonary embolism ([place]) 06/22/2018  . Microcytic anemia 06/17/2018  . At risk for adverse drug reaction 06/11/2018  . Critical lower limb ischemia ([place]) 05/27/2018  . GI bleeding 04/02/2018  . Acute renal failure (ARF) ([place]) 04/02/2018  . Hypokalemia 04/02/2018  . Polysubstance abuse ([place]) 04/02/2018  . Diabetic foot ulcer ([place]) 04/02/2018  . Tinea pedis 03/07/2018  . Diabetic peripheral neuropathy ([place]) 01/24/2018  . Cannabis use disorder, moderate, dependence ([place]) 05/19/2017  . Cocaine use disorder, moderate, dependence ([place]) 05/19/2017  . CKD (chronic kidney disease) stage 3, GFR 30-59 ml/min (HCC) 02/17/2017  . Noncompliance with medications 02/17/2017  . Suicide attempt ([place]) 11/06/2016  . Mild cognitive impairment 03/21/2016  . Moderate episode of  recurrent major depressive disorder ([place]) 03/21/2016  . B12 deficiency 03/09/2016  . Iron deficiency anemia 03/07/2016  . History of CVA with residual deficit 12/30/2015  . Schizoaffective disorder, bipolar type ([place]) 10/07/2015  . OSA (obstructive sleep apnea) 06/09/2015  . Vitamin D deficiency 01/06/2015  . Bilateral knee pain 01/05/2015  . Numbness and tingling in right hand 01/05/2015  . Insomnia 12/07/2014  . Right-sided lacunar  infarction ([place]) 09/15/2014  . Left hemiparesis ([place]) 09/15/2014  . Dyspnea   . Back pain 09/11/2013  . Psychoactive substance-induced organic mood disorder ([place]) 05/28/2013  . Cocaine abuse with cocaine-induced mood disorder ([place]) 05/28/2013  . Cannabis dependence with cannabis-induced anxiety disorder ([place]) 05/28/2013  . Morbid obesity with BMI of 45.0-49.9, adult ([place]) 04/25/2012  . Chest pain 04/17/2012  . Hypertension 04/17/2012  . DM (diabetes mellitus) with peripheral vascular complication ([place]) 72/04/4708  . Hyperlipidemia 04/17/2012  . Tobacco use 04/17/2012     Past Surgical History:  Procedure Laterality Date  . ABDOMINAL AORTOGRAM W/LOWER EXTREMITY Left 05/27/2018   Procedure: ABDOMINAL AORTOGRAM W/LOWER EXTREMITY [place] and Possible Intervention;  Surgeon: Waynetta Sandy, MD;  Location: [place] [facility];  Service: Cardiovascular;  Laterality: Left;  . APPLICATION OF WOUND VAC Left 06/06/2018   Procedure: APPLICATION OF WOUND VAC;  Surgeon: Waynetta Sandy, MD;  Location: [facility];  Service: Vascular;  Laterality: Left;  . BALLOON DILATION N/A 12/31/2020   Procedure: BALLOON DILATION;  Surgeon: Ronnette Juniper, MD;  Location: [place];  Service: Gastroenterology;  Laterality: N/A;  . BIOPSY  04/04/2018   Procedure: BIOPSY;  Surgeon: [name], MD;  Location: [facility] ENDOSCOPY;  Service: Endoscopy;;  . BIOPSY  10/27/2020   Procedure: BIOPSY;  Surgeon: Arta Silence, MD;  Location: [facility] ENDOSCOPY;  Service: Endoscopy;;  . BOTOX INJECTION N/A 12/31/2020   Procedure: BOTOX INJECTION;  Surgeon: Ronnette Juniper, MD;  Location: [place];  Service: Gastroenterology;  Laterality: N/A;  . CARDIAC CATHETERIZATION  ~ 2011  . COLONOSCOPY N/A 04/04/2018   Procedure: COLONOSCOPY;  Surgeon: [name], MD;  Location: [facility] ENDOSCOPY;  Service: Endoscopy;  Laterality: N/A;  . COLONOSCOPY WITH PROPOFOL N/A 10/27/2020   Procedure: COLONOSCOPY WITH PROPOFOL;  Surgeon: Arta Silence,  MD;  Location: [facility] ENDOSCOPY;  Service: Endoscopy;  Laterality: N/A;  . ESOPHAGOGASTRODUODENOSCOPY N/A 12/31/2020   Procedure: ESOPHAGOGASTRODUODENOSCOPY (EGD);  Surgeon: Ronnette Juniper, MD;  Location: [place];  Service: Gastroenterology;  Laterality: N/A;  . ESOPHAGOGASTRODUODENOSCOPY (EGD) WITH PROPOFOL N/A 10/27/2020   Procedure: ESOPHAGOGASTRODUODENOSCOPY (EGD) WITH PROPOFOL  WITH POSSIBLE DIL;  Surgeon: Arta Silence, MD;  Location: [facility] ENDOSCOPY;  Service: Endoscopy;  Laterality: N/A;  . FOREIGN BODY REMOVAL  12/31/2020   Procedure: FOREIGN BODY REMOVAL;  Surgeon: Ronnette Juniper, MD;  Location: [place];  Service: Gastroenterology;;  . LOWER EXTREMITY ANGIOGRAM Left 05/27/2018  . PERIPHERAL VASCULAR INTERVENTION Left 05/27/2018   Procedure: PERIPHERAL VASCULAR INTERVENTION;  Surgeon: Waynetta Sandy, MD;  Location: [facility];  Service: Cardiovascular;  Laterality: Left;  . POLYPECTOMY  04/04/2018   Procedure: POLYPECTOMY;  Surgeon: [name], MD;  Location: [facility] ENDOSCOPY;  Service: Endoscopy;;  . POLYPECTOMY  10/27/2020   Procedure: POLYPECTOMY;  Surgeon: Arta Silence, MD;  Location: [facility] ENDOSCOPY;  Service: Endoscopy;;  . TRANSMETATARSAL AMPUTATION Left 05/28/2018   Procedure: AMPUTATION TOES THREE, FOUR AND FIVE on left foot;  Surgeon: Waynetta Sandy, MD;  Location: [facility];  Service: Vascular;  Laterality: Left;  . WOUND DEBRIDEMENT Left 06/06/2018   Procedure: DEBRIDEMENT WOUND  LEFT FOOT;  Surgeon: Waynetta Sandy, MD;  Location: [place];  Service: Vascular;  Laterality: Left;     Prior to Admission medications   Medication Sig Start Date End Date Taking? Authorizing Provider  amLODipine (NORVASC) 10 MG tablet Take 1 tablet (10 mg total) by mouth daily. 01/22/20  Yes Wert, Margreta Journey, NP  ARIPiprazole (ABILIFY) 5 MG tablet Take 5 mg by mouth daily. 08/28/20  Yes [provider]  ascorbic acid (VITAMIN C) 500 MG tablet Take 1,000 mg by mouth  daily. For COVID for 7 days 01/08/21 01/15/21 Yes [provider]  aspirin EC 81 MG EC tablet Take 1 tablet (81 mg total) by mouth daily. 09/21/18  Yes Donzetta Starch, NP  atorvastatin (LIPITOR) 40 MG tablet Take 2 tablets (80 mg total) by mouth daily at 6 PM. 01/03/21  Yes Hongalgi, Lenis Dickinson, MD  cloNIDine (CATAPRES) 0.2 MG tablet Take 0.2 mg by mouth 2 (two) times daily. For 7 days 01/11/21 01/18/21 Yes [provider]  dexamethasone (DECADRON) 6 MG tablet Take 6 mg by mouth daily. For COVID for 10 days   Yes [provider]  hydrALAZINE (APRESOLINE) 100 MG tablet Take 1 tablet (100 mg total) by mouth 3 (three) times daily. 01/22/20  Yes Wert, Margreta Journey, NP  insulin aspart (NOVOLOG) 100 UNIT/ML injection Inject 0-9 Units into the skin 3 (three) times daily with meals. Correction coverage: Sensitive (thin, NPO, renal) CBG < 70: Implement Hypoglycemia protocol. CBG 70 - 120: 0 units CBG 121 - 150: 1 unit CBG 151 - 200: 2 units CBG 201 - 250: 3 units CBG 251 - 300: 5 units CBG 301 - 350: 7 units CBG 351 - 400: 9 units CBG > 400: call MD. 01/03/21  Yes Hongalgi, Lenis Dickinson, MD  lisinopril (ZESTRIL) 40 MG tablet Take 1 tablet (40 mg total) by mouth daily. 01/22/20  Yes Royal Hawthorn, NP  metoprolol succinate (TOPROL-XL) 50 MG 24 hr tablet Take 50 mg by mouth every morning. 08/02/20  Yes [provider]  mirtazapine (REMERON) 15 MG tablet Take 1 tablet (15 mg total) by mouth at bedtime. For Depression Patient taking differently: Take 15 mg by mouth at bedtime. For Depression , appetite 01/22/20  Yes Wert, Margreta Journey, NP  pantoprazole (PROTONIX) 40 MG tablet Take 1 tablet (40 mg total) by mouth daily. 01/03/21  Yes Hongalgi, Lenis Dickinson, MD  ACCU-CHEK GUIDE test strip 2 (two) times daily. as directed 02/06/20   [provider]  Accu-Chek Softclix Lancets lancets 2 (two) times daily. 02/06/20   [provider]  amoxicillin-clavulanate (AUGMENTIN) 875-125 MG tablet Take  1 tablet by mouth every 12 (twelve) hours. Discontinue after Jan 04, 2021 doses. Patient not taking: Reported on 01/11/2021 01/03/21   Modena Jansky, MD  Blood Glucose Monitoring Suppl (ACCU-CHEK GUIDE) w/Device KIT USE AS DIRECTED TO CHECK BLOOD SUGAR 02/06/20   [provider]  cloNIDine (CATAPRES) 0.1 MG tablet Take 1 tablet (0.1 mg total) by mouth 2 (two) times daily. Patient not taking: Reported on 01/11/2021 01/03/21   Modena Jansky, MD     Allergies Morphine and related and Metformin   Family History  Problem Relation Age of Onset  . Stroke Brother 27  . Stroke Brother 30  . Heart attack Mother 72  . Stroke Father 69    Social History Social History   Tobacco Use  . Smoking status: Former Smoker    Packs/day: 0.25    Years: 28.00  Pack years: 7.00    Types: Cigarettes  . Smokeless tobacco: Never Used  Vaping Use  . Vaping Use: Never used  Substance Use Topics  . Alcohol use: No    Alcohol/week: 0.0 standard drinks    Comment: rare  . Drug use: Yes    Types: Marijuana, "Crack" cocaine    Comment: marijuana last use days ago; last cocaine several months ago    Review of Systems  Constitutional:   No fever or chills.  ENT:   No sore throat. No rhinorrhea. Cardiovascular: Positive chest pain as above without syncope. Respiratory:   No dyspnea, positive nonproductive cough. Gastrointestinal:   Negative for abdominal pain, vomiting and diarrhea.  Musculoskeletal:   Negative for focal pain or swelling All other systems reviewed and are negative except as documented above in ROS and HPI.  ____________________________________________   PHYSICAL EXAM:  VITAL SIGNS: ED Triage Vitals  Enc Vitals Group     BP 01/11/21 1708 (!) 156/97     Pulse Rate 01/11/21 1708 69     Resp 01/11/21 1708 13     Temp 01/11/21 1708 (!) 97.5 F (36.4 C)     Temp Source 01/11/21 1708 Axillary     SpO2 01/11/21 1708 100 %     Weight 01/11/21 1706 188 lb 7.9 oz (85.5  kg)     Height 01/11/21 1706 $RemoveBefor'5\' 1"'micWauXAkukh$  (1.549 m)     Head Circumference --      Peak Flow --      Pain Score 01/11/21 1920 0     Pain Loc --      Pain Edu? --      Excl. in [place]? --     Vital signs reviewed, nursing assessments reviewed.   Constitutional:   Alert and oriented. Non-toxic appearance. Eyes:   Conjunctivae are normal. EOMI. PERRL. ENT      Head:   Normocephalic and atraumatic.      Nose:   Wearing a mask.      Mouth/Throat:   Wearing a mask.      Neck:   No meningismus. Full ROM. Hematological/Lymphatic/Immunilogical:   No cervical lymphadenopathy. Cardiovascular:   RRR. Symmetric bilateral radial and DP pulses.  No murmurs. Cap refill less than 2 seconds. Respiratory:   Normal respiratory effort without tachypnea/retractions. Breath sounds are clear and equal bilaterally. No wheezes/rales/rhonchi. Gastrointestinal:   Soft and nontender. Non distended. There is no CVA tenderness.  No rebound, rigidity, or guarding. Genitourinary:   deferred Musculoskeletal:   Normal range of motion in all extremities. No joint effusions.  No lower extremity tenderness.  No edema. Neurologic:   Normal speech and language.  Motor grossly intact. No acute focal neurologic deficits are appreciated.  Skin:    Skin is warm, dry and intact. No rash noted.  No petechiae, purpura, or bullae.  ____________________________________________    LABS (pertinent positives/negatives) (all labs ordered are listed, but only abnormal results are displayed) Labs Reviewed  COMPREHENSIVE METABOLIC PANEL - Abnormal; Notable for the following components:      Result Value   Sodium 134 (*)    Glucose, Bld 200 (*)    BUN 24 (*)    Creatinine, Ser 1.11 (*)    All other components within normal limits  CBC WITH DIFFERENTIAL/PLATELET - Abnormal; Notable for the following components:   Hemoglobin 10.6 (*)    HCT 33.7 (*)    MCV 72.3 (*)    MCH 22.7 (*)    RDW 16.1 (*)  Lymphs Abs 4.3 (*)    Monocytes  Absolute 1.1 (*)    Abs Immature Granulocytes 0.12 (*)    All other components within normal limits  LIPASE, BLOOD  TROPONIN I (HIGH SENSITIVITY)  TROPONIN I (HIGH SENSITIVITY)   ____________________________________________   EKG  Interpreted by me Sinus rhythm rate of 65, normal axis, normal intervals.  Poor R wave progression, likely LVH.  Slight ST elevation in V2 through V4 without reciprocal changes.  Slight lateral T wave inversions.  Similar to previous EKG on Jan 04, 2021..  ____________________________________________    [id number]  DG Chest Portable 1 View  Result Date: 01/11/2021 CLINICAL DATA:  Recent diagnosis of COVID with cough. EXAM: PORTABLE CHEST 1 VIEW COMPARISON:  Dec 27, 2020 FINDINGS: Mild linear atelectasis is seen along the lateral aspect of the mid left lung. In ill-defined subcentimeter nodular opacity is seen overlying the right lung base. This is not clearly identified on the prior study. There is no evidence of acute infiltrate, pleural effusion or pneumothorax. The cardiac silhouette is borderline in size. The visualized skeletal structures are unremarkable. IMPRESSION: No acute or active cardiopulmonary disease. Electronically Signed   By: Virgina Norfolk M.D.   On: 01/11/2021 17:37    ____________________________________________   PROCEDURES Procedures  ____________________________________________  DIFFERENTIAL DIAGNOSIS   Non-STEMI, musculoskeletal pain, pneumonia, pneumothorax, COVID pneumonitis  CLINICAL IMPRESSION / ASSESSMENT AND PLAN / ED COURSE  Medications ordered in the ED: Medications - No data to display  Pertinent labs & imaging results that were available during my care of the patient were reviewed by me and considered in my medical decision making (see chart for details).  Diamond Rice was evaluated in Emergency Department on 01/12/2021 for the symptoms described in the history of present illness. She was evaluated in the  context of the global COVID-19 pandemic, which necessitated consideration that the patient might be at risk for infection with the SARS-CoV-2 virus that causes COVID-19. Institutional protocols and algorithms that pertain to the evaluation of patients at risk for COVID-19 are in a state of rapid change based on information released by regulatory bodies including the CDC and federal and state organizations. These policies and algorithms were followed during the patient's care in the ED.     Clinical Course as of 01/12/21 0011  Tue Jan 11, 2021  1719 Patient seen on arrival by myself and cardiology Dr. Saunders Revel.  He notes that EKG is consistent with LVH and also similar to previous EKGs this patient has had.  He doubts an acute infarct, agrees with plan to trend troponin and do general work-up given that symptoms have resolved on arrival. [PS]    Clinical Course User Index [PS] Carrie Mew, MD     ----------------------------------------- 12:11 AM on 01/12/2021 -----------------------------------------  Work-up negative, chest x-ray viewed and interpreted by me and unremarkable.  Radiology report agrees.  Serial troponins negative, other lab tests all unremarkable.  Patient's pain has remained resolved.  Likely COVID-related.  Stable for discharge back to her facility.  ____________________________________________   FINAL CLINICAL IMPRESSION(S) / ED DIAGNOSES    Final diagnoses:  Left upper quadrant abdominal pain  Nonspecific chest pain  Schizoaffective disorder, bipolar type (HCC)  Simple chronic bronchitis (HCC)  Class 2 severe obesity due to excess calories with serious comorbidity and body mass index (BMI) of 35.0 to 35.9 in adult [facility])     ED Discharge Orders    None      Portions of this note were  generated with [employer]. Dictation errors may occur despite best attempts at proofreading.   Carrie Mew, MD 01/12/21 [phone]

## 2021-01-11 NOTE — ED Notes (Signed)
Pt provided with 2 warm blankets at this time.  

## 2021-01-11 NOTE — ED Triage Notes (Signed)
Pt to ED via [facility] from [facility], upon patient arrival cardiologist at bedside. Per EMS pt dx with Covid 5/14, initially called out for CP and cough that started today with CP that started approx 30-22mins PTA. Per EMS pt was given 2L nitro by staff without relief. EMS reports 3 baby ASA en route, pt has had a total of 4 baby ASA today. EMS reports 63mcg given en route. Pt c/o CP that is a pressure feeling.

## 2021-01-11 NOTE — ED Notes (Signed)
Per Dr. Saunders Revel, patient will not go to the cath lab at this time.

## 2021-01-12 NOTE — ED Notes (Signed)
D/c paperwork explained to pt at this time.

## 2021-01-12 NOTE — ED Notes (Signed)
Called [facility] for transport to [facility] @ 12:04AM.

## 2021-01-16 ENCOUNTER — Inpatient Hospital Stay
Admission: EM | Admit: 2021-01-16 | Discharge: 2021-01-20 | DRG: 690 | Disposition: A | Discharge status: Skilled Nursing Facility | Payer: Medicare Other | Attending: Internal Medicine | Admitting: Internal Medicine

## 2021-01-16 ENCOUNTER — Emergency Department: Payer: Medicare Other

## 2021-01-16 ENCOUNTER — Encounter: Payer: Self-pay | Admitting: Emergency Medicine

## 2021-01-16 DIAGNOSIS — G4733 Obstructive sleep apnea (adult) (pediatric): Secondary | ICD-10-CM | POA: Diagnosis present

## 2021-01-16 DIAGNOSIS — N183 Chronic kidney disease, stage 3 unspecified: Secondary | ICD-10-CM | POA: Diagnosis present

## 2021-01-16 DIAGNOSIS — I129 Hypertensive chronic kidney disease with stage 1 through stage 4 chronic kidney disease, or unspecified chronic kidney disease: Secondary | ICD-10-CM | POA: Diagnosis present

## 2021-01-16 DIAGNOSIS — N390 Urinary tract infection, site not specified: Principal | ICD-10-CM

## 2021-01-16 DIAGNOSIS — R490 Dysphonia: Secondary | ICD-10-CM | POA: Diagnosis present

## 2021-01-16 DIAGNOSIS — E669 Obesity, unspecified: Secondary | ICD-10-CM | POA: Diagnosis present

## 2021-01-16 DIAGNOSIS — I10 Essential (primary) hypertension: Secondary | ICD-10-CM | POA: Diagnosis present

## 2021-01-16 DIAGNOSIS — Z888 Allergy status to other drugs, medicaments and biological substances status: Secondary | ICD-10-CM

## 2021-01-16 DIAGNOSIS — I6930 Unspecified sequelae of cerebral infarction: Secondary | ICD-10-CM

## 2021-01-16 DIAGNOSIS — E1165 Type 2 diabetes mellitus with hyperglycemia: Secondary | ICD-10-CM | POA: Diagnosis present

## 2021-01-16 DIAGNOSIS — F1910 Other psychoactive substance abuse, uncomplicated: Secondary | ICD-10-CM | POA: Diagnosis present

## 2021-01-16 DIAGNOSIS — I69320 Aphasia following cerebral infarction: Secondary | ICD-10-CM

## 2021-01-16 DIAGNOSIS — Z87891 Personal history of nicotine dependence: Secondary | ICD-10-CM

## 2021-01-16 DIAGNOSIS — F250 Schizoaffective disorder, bipolar type: Secondary | ICD-10-CM | POA: Diagnosis present

## 2021-01-16 DIAGNOSIS — Z885 Allergy status to narcotic agent status: Secondary | ICD-10-CM

## 2021-01-16 DIAGNOSIS — B9620 Unspecified Escherichia coli [E. coli] as the cause of diseases classified elsewhere: Secondary | ICD-10-CM | POA: Diagnosis present

## 2021-01-16 DIAGNOSIS — I69354 Hemiplegia and hemiparesis following cerebral infarction affecting left non-dominant side: Secondary | ICD-10-CM

## 2021-01-16 DIAGNOSIS — Z8249 Family history of ischemic heart disease and other diseases of the circulatory system: Secondary | ICD-10-CM

## 2021-01-16 DIAGNOSIS — E1122 Type 2 diabetes mellitus with diabetic chronic kidney disease: Secondary | ICD-10-CM | POA: Diagnosis present

## 2021-01-16 DIAGNOSIS — I639 Cerebral infarction, unspecified: Secondary | ICD-10-CM | POA: Diagnosis present

## 2021-01-16 DIAGNOSIS — I739 Peripheral vascular disease, unspecified: Secondary | ICD-10-CM | POA: Diagnosis present

## 2021-01-16 DIAGNOSIS — Z79899 Other long term (current) drug therapy: Secondary | ICD-10-CM

## 2021-01-16 DIAGNOSIS — Z7982 Long term (current) use of aspirin: Secondary | ICD-10-CM

## 2021-01-16 DIAGNOSIS — F419 Anxiety disorder, unspecified: Secondary | ICD-10-CM | POA: Diagnosis present

## 2021-01-16 DIAGNOSIS — Z823 Family history of stroke: Secondary | ICD-10-CM

## 2021-01-16 DIAGNOSIS — E7800 Pure hypercholesterolemia, unspecified: Secondary | ICD-10-CM | POA: Diagnosis present

## 2021-01-16 DIAGNOSIS — Z8616 Personal history of COVID-19: Secondary | ICD-10-CM

## 2021-01-16 DIAGNOSIS — D638 Anemia in other chronic diseases classified elsewhere: Secondary | ICD-10-CM | POA: Diagnosis present

## 2021-01-16 DIAGNOSIS — Z794 Long term (current) use of insulin: Secondary | ICD-10-CM

## 2021-01-16 DIAGNOSIS — D72829 Elevated white blood cell count, unspecified: Secondary | ICD-10-CM

## 2021-01-16 DIAGNOSIS — U071 COVID-19: Secondary | ICD-10-CM | POA: Diagnosis present

## 2021-01-16 DIAGNOSIS — D509 Iron deficiency anemia, unspecified: Secondary | ICD-10-CM | POA: Diagnosis present

## 2021-01-16 DIAGNOSIS — R471 Dysarthria and anarthria: Secondary | ICD-10-CM

## 2021-01-16 DIAGNOSIS — E1151 Type 2 diabetes mellitus with diabetic peripheral angiopathy without gangrene: Secondary | ICD-10-CM | POA: Diagnosis present

## 2021-01-16 DIAGNOSIS — N1831 Chronic kidney disease, stage 3a: Secondary | ICD-10-CM | POA: Diagnosis present

## 2021-01-16 DIAGNOSIS — E785 Hyperlipidemia, unspecified: Secondary | ICD-10-CM | POA: Diagnosis present

## 2021-01-16 DIAGNOSIS — J449 Chronic obstructive pulmonary disease, unspecified: Secondary | ICD-10-CM | POA: Diagnosis present

## 2021-01-16 LAB — CBC
HCT: 32.6 % — ABNORMAL LOW (ref 36.0–46.0)
Hemoglobin: 10.5 g/dL — ABNORMAL LOW (ref 12.0–15.0)
MCH: 23 pg — ABNORMAL LOW (ref 26.0–34.0)
MCHC: 32.2 g/dL (ref 30.0–36.0)
MCV: 71.5 fL — ABNORMAL LOW (ref 80.0–100.0)
Platelets: 388 10*3/uL (ref 150–400)
RBC: 4.56 MIL/uL (ref 3.87–5.11)
RDW: 15.9 % — ABNORMAL HIGH (ref 11.5–15.5)
WBC: 13.4 10*3/uL — ABNORMAL HIGH (ref 4.0–10.5)
nRBC: 0 % (ref 0.0–0.2)

## 2021-01-16 LAB — COMPREHENSIVE METABOLIC PANEL
ALT: 23 U/L (ref 0–44)
AST: 22 U/L (ref 15–41)
Albumin: 3.3 g/dL — ABNORMAL LOW (ref 3.5–5.0)
Alkaline Phosphatase: 56 U/L (ref 38–126)
Anion gap: 11 (ref 5–15)
BUN: 33 mg/dL — ABNORMAL HIGH (ref 6–20)
CO2: 24 mmol/L (ref 22–32)
Calcium: 9.3 mg/dL (ref 8.9–10.3)
Chloride: 100 mmol/L (ref 98–111)
Creatinine, Ser: 1.17 mg/dL — ABNORMAL HIGH (ref 0.44–1.00)
GFR, Estimated: 58 mL/min — ABNORMAL LOW (ref >60)
Glucose, Bld: 366 mg/dL — ABNORMAL HIGH (ref 70–99)
Potassium: 4.3 mmol/L (ref 3.5–5.1)
Sodium: 135 mmol/L (ref 135–145)
Total Bilirubin: 0.6 mg/dL (ref 0.3–1.2)
Total Protein: 7.5 g/dL (ref 6.5–8.1)

## 2021-01-16 LAB — CBG MONITORING, ED: Glucose-Capillary: 357 mg/dL — ABNORMAL HIGH (ref 70–99)

## 2021-01-16 LAB — APTT: aPTT: 27 seconds (ref 24–36)

## 2021-01-16 LAB — PROTIME-INR
INR: 0.9 (ref 0.8–1.2)
Prothrombin Time: 12.3 seconds (ref 11.4–15.2)

## 2021-01-16 LAB — ETHANOL: Alcohol, Ethyl (B): <10 mg/dL (ref <10)

## 2021-01-16 MED ORDER — IOHEXOL 350 MG/ML SOLN
100.0000 mL | Freq: Once | INTRAVENOUS | Status: AC | PRN
  Administered 2021-01-16: 100 mL via INTRAVENOUS

## 2021-01-16 NOTE — ED Notes (Signed)
Patient brought directly to CT by EMS with primary RN.

## 2021-01-16 NOTE — ED Triage Notes (Signed)
Pt arrived via EMS post CODE STROKE called on field at 2250. Pt from [facility] where she resides due to previous CVA, affecting her left side. Per EMS, LKW was 2015. Deficits noted are numbness and tingling to the right side of body, slurred speech, left sided facial droop and expressive aphasia. Pt diagnosed with COVID x14 days ago and has been in the isolation unit at [facility].    CBG 116 BP 139/78  Pt to CT on EMS stretcher post Alfred Levins, MD evaluation.

## 2021-01-16 NOTE — ED Notes (Signed)
Patient transported to CT with primary RN for CTA.

## 2021-01-16 NOTE — ED Notes (Signed)
CODE stroke called at 2250.

## 2021-01-16 NOTE — ED Notes (Signed)
Last known well when nursing staff was administering patient medications at 2015 tonight.

## 2021-01-16 NOTE — ED Notes (Signed)
Per neurologist at via telehealth, not a candidate for TPA at this time due to hemorrhagic stoke hx and recent stroke 20 days ago. Plan for CTA of head.

## 2021-01-17 ENCOUNTER — Encounter: Payer: Self-pay | Admitting: Internal Medicine

## 2021-01-17 ENCOUNTER — Observation Stay: Payer: Medicare Other

## 2021-01-17 ENCOUNTER — Other Ambulatory Visit: Payer: Self-pay

## 2021-01-17 DIAGNOSIS — E1151 Type 2 diabetes mellitus with diabetic peripheral angiopathy without gangrene: Secondary | ICD-10-CM | POA: Diagnosis not present

## 2021-01-17 DIAGNOSIS — D72829 Elevated white blood cell count, unspecified: Secondary | ICD-10-CM

## 2021-01-17 DIAGNOSIS — N1831 Chronic kidney disease, stage 3a: Secondary | ICD-10-CM | POA: Diagnosis not present

## 2021-01-17 DIAGNOSIS — F250 Schizoaffective disorder, bipolar type: Secondary | ICD-10-CM

## 2021-01-17 DIAGNOSIS — G4733 Obstructive sleep apnea (adult) (pediatric): Secondary | ICD-10-CM

## 2021-01-17 DIAGNOSIS — I639 Cerebral infarction, unspecified: Secondary | ICD-10-CM

## 2021-01-17 DIAGNOSIS — N390 Urinary tract infection, site not specified: Secondary | ICD-10-CM | POA: Diagnosis not present

## 2021-01-17 DIAGNOSIS — I10 Essential (primary) hypertension: Secondary | ICD-10-CM

## 2021-01-17 DIAGNOSIS — E7849 Other hyperlipidemia: Secondary | ICD-10-CM

## 2021-01-17 DIAGNOSIS — I739 Peripheral vascular disease, unspecified: Secondary | ICD-10-CM

## 2021-01-17 DIAGNOSIS — D638 Anemia in other chronic diseases classified elsewhere: Secondary | ICD-10-CM | POA: Diagnosis not present

## 2021-01-17 DIAGNOSIS — R471 Dysarthria and anarthria: Secondary | ICD-10-CM

## 2021-01-17 LAB — DIFFERENTIAL
Abs Immature Granulocytes: 0 10*3/uL (ref 0.00–0.07)
Basophils Absolute: 0 10*3/uL (ref 0.0–0.1)
Basophils Relative: 0 %
Eosinophils Absolute: 0 10*3/uL (ref 0.0–0.5)
Eosinophils Relative: 0 %
Lymphocytes Relative: 25 %
Lymphs Abs: 3.4 10*3/uL (ref 0.7–4.0)
Monocytes Absolute: 0.8 10*3/uL (ref 0.1–1.0)
Monocytes Relative: 6 %
Neutro Abs: 9.2 10*3/uL — ABNORMAL HIGH (ref 1.7–7.7)
Neutrophils Relative %: 69 %
Smear Review: NORMAL

## 2021-01-17 LAB — URINE DRUG SCREEN, QUALITATIVE (ARMC ONLY)
Amphetamines, Ur Screen: NOT DETECTED
Barbiturates, Ur Screen: NOT DETECTED
Benzodiazepine, Ur Scrn: NOT DETECTED
Cannabinoid 50 Ng, Ur ~~LOC~~: NOT DETECTED
Cocaine Metabolite,Ur ~~LOC~~: NOT DETECTED
MDMA (Ecstasy)Ur Screen: NOT DETECTED
Methadone Scn, Ur: NOT DETECTED
Opiate, Ur Screen: NOT DETECTED
Phencyclidine (PCP) Ur S: NOT DETECTED
Tricyclic, Ur Screen: NOT DETECTED

## 2021-01-17 LAB — MRSA PCR SCREENING: MRSA by PCR: NEGATIVE

## 2021-01-17 LAB — LIPID PANEL
Cholesterol: 143 mg/dL (ref 0–200)
HDL: 72 mg/dL (ref >40)
LDL Cholesterol: 60 mg/dL (ref 0–99)
Total CHOL/HDL Ratio: 2 RATIO
Triglycerides: 57 mg/dL (ref <150)
VLDL: 11 mg/dL (ref 0–40)

## 2021-01-17 LAB — URINALYSIS, ROUTINE W REFLEX MICROSCOPIC
Bilirubin Urine: NEGATIVE
Glucose, UA: >=500 mg/dL — AB
Hgb urine dipstick: NEGATIVE
Ketones, ur: NEGATIVE mg/dL
Nitrite: POSITIVE — AB
Protein, ur: NEGATIVE mg/dL
Specific Gravity, Urine: 1.022 (ref 1.005–1.030)
Squamous Epithelial / HPF: NONE SEEN (ref 0–5)
pH: 6 (ref 5.0–8.0)

## 2021-01-17 LAB — GLUCOSE, CAPILLARY
Glucose-Capillary: 172 mg/dL — ABNORMAL HIGH (ref 70–99)
Glucose-Capillary: 126 mg/dL — ABNORMAL HIGH (ref 70–99)
Glucose-Capillary: 219 mg/dL — ABNORMAL HIGH (ref 70–99)
Glucose-Capillary: 148 mg/dL — ABNORMAL HIGH (ref 70–99)

## 2021-01-17 LAB — RESP PANEL BY RT-PCR (FLU A&B, COVID) ARPGX2
Influenza A by PCR: NEGATIVE
Influenza B by PCR: NEGATIVE
SARS Coronavirus 2 by RT PCR: POSITIVE — AB

## 2021-01-17 LAB — HEMOGLOBIN A1C
Hgb A1c MFr Bld: 7.6 % — ABNORMAL HIGH (ref 4.8–5.6)
Mean Plasma Glucose: 171.42 mg/dL

## 2021-01-17 LAB — CBG MONITORING, ED: Glucose-Capillary: 289 mg/dL — ABNORMAL HIGH (ref 70–99)

## 2021-01-17 LAB — POC URINE PREG, ED: Preg Test, Ur: NEGATIVE

## 2021-01-17 MED ORDER — HYDRALAZINE HCL 20 MG/ML IJ SOLN
10.0000 mg | INTRAMUSCULAR | Status: DC | PRN
  Administered 2021-01-17: 22:00:00 10 mg via INTRAVENOUS
  Filled 2021-01-17: qty 1

## 2021-01-17 MED ORDER — ASPIRIN 300 MG RE SUPP
300.0000 mg | Freq: Once | RECTAL | Status: AC
  Administered 2021-01-17: 300 mg via RECTAL
  Filled 2021-01-17: qty 1

## 2021-01-17 MED ORDER — ATORVASTATIN CALCIUM 20 MG PO TABS
80.0000 mg | ORAL_TABLET | Freq: Every day | ORAL | Status: DC
  Administered 2021-01-18 – 2021-01-19 (×2): 80 mg via ORAL
  Filled 2021-01-17 (×2): qty 4

## 2021-01-17 MED ORDER — ACETAMINOPHEN 160 MG/5ML PO SOLN
650.0000 mg | ORAL | Status: DC | PRN
  Filled 2021-01-17: qty 20.3

## 2021-01-17 MED ORDER — ACETAMINOPHEN 650 MG RE SUPP
650.0000 mg | RECTAL | Status: DC | PRN
Start: 2021-01-17 — End: 2021-01-20

## 2021-01-17 MED ORDER — INSULIN GLARGINE 100 UNIT/ML ~~LOC~~ SOLN
10.0000 [IU] | Freq: Every day | SUBCUTANEOUS | Status: DC
  Administered 2021-01-17 – 2021-01-20 (×4): 10 [IU] via SUBCUTANEOUS
  Filled 2021-01-17 (×5): qty 0.1

## 2021-01-17 MED ORDER — ORAL CARE MOUTH RINSE
15.0000 mL | Freq: Two times a day (BID) | OROMUCOSAL | Status: DC
  Administered 2021-01-17 – 2021-01-20 (×6): 15 mL via OROMUCOSAL
  Filled 2021-01-17: qty 15

## 2021-01-17 MED ORDER — PANTOPRAZOLE SODIUM 40 MG PO TBEC
40.0000 mg | DELAYED_RELEASE_TABLET | Freq: Every day | ORAL | Status: DC
  Administered 2021-01-17 – 2021-01-20 (×4): 40 mg via ORAL
  Filled 2021-01-17 (×4): qty 1

## 2021-01-17 MED ORDER — SENNOSIDES-DOCUSATE SODIUM 8.6-50 MG PO TABS
1.0000 | ORAL_TABLET | Freq: Every evening | ORAL | Status: DC | PRN
  Administered 2021-01-20: 1 via ORAL
  Filled 2021-01-17: qty 1

## 2021-01-17 MED ORDER — ASPIRIN EC 81 MG PO TBEC
81.0000 mg | DELAYED_RELEASE_TABLET | Freq: Every day | ORAL | Status: DC
  Administered 2021-01-17 – 2021-01-20 (×4): 81 mg via ORAL
  Filled 2021-01-17 (×4): qty 1

## 2021-01-17 MED ORDER — ENOXAPARIN SODIUM 30 MG/0.3ML IJ SOSY
30.0000 mg | PREFILLED_SYRINGE | Freq: Two times a day (BID) | INTRAMUSCULAR | Status: DC
  Administered 2021-01-17 – 2021-01-20 (×7): 30 mg via SUBCUTANEOUS
  Filled 2021-01-17 (×8): qty 0.3

## 2021-01-17 MED ORDER — STROKE: EARLY STAGES OF RECOVERY BOOK: Freq: Once | Status: AC

## 2021-01-17 MED ORDER — ARIPIPRAZOLE 5 MG PO TABS
5.0000 mg | ORAL_TABLET | Freq: Every day | ORAL | Status: DC
  Administered 2021-01-17 – 2021-01-20 (×4): 5 mg via ORAL
  Filled 2021-01-17 (×4): qty 1

## 2021-01-17 MED ORDER — INSULIN ASPART 100 UNIT/ML IJ SOLN
0.0000 [IU] | Freq: Every day | INTRAMUSCULAR | Status: DC
  Administered 2021-01-17: 3 [IU] via SUBCUTANEOUS
  Filled 2021-01-17: qty 1

## 2021-01-17 MED ORDER — SODIUM CHLORIDE 0.9 % IV SOLN
1.0000 g | INTRAVENOUS | Status: DC
  Administered 2021-01-17 – 2021-01-20 (×4): 1 g via INTRAVENOUS
  Filled 2021-01-17 (×2): qty 1
  Filled 2021-01-17 (×2): qty 10
  Filled 2021-01-17: qty 1

## 2021-01-17 MED ORDER — SODIUM CHLORIDE 0.9 % IV SOLN: INTRAVENOUS | Status: DC

## 2021-01-17 MED ORDER — ACETAMINOPHEN 325 MG PO TABS
650.0000 mg | ORAL_TABLET | ORAL | Status: DC | PRN
Start: 2021-01-17 — End: 2021-01-20

## 2021-01-17 MED ORDER — INSULIN ASPART 100 UNIT/ML IJ SOLN
0.0000 [IU] | Freq: Three times a day (TID) | INTRAMUSCULAR | Status: DC
Start: 2021-01-17 — End: 2021-01-20
  Administered 2021-01-17: 10:00:00 3 [IU] via SUBCUTANEOUS
  Administered 2021-01-17: 5 [IU] via SUBCUTANEOUS
  Administered 2021-01-17: 13:00:00 2 [IU] via SUBCUTANEOUS
  Administered 2021-01-18: 13:00:00 3 [IU] via SUBCUTANEOUS
  Administered 2021-01-18: 2 [IU] via SUBCUTANEOUS
  Administered 2021-01-18 – 2021-01-19 (×2): 3 [IU] via SUBCUTANEOUS
  Administered 2021-01-19 (×2): 2 [IU] via SUBCUTANEOUS
  Administered 2021-01-20: 08:00:00 3 [IU] via SUBCUTANEOUS
  Filled 2021-01-17 (×10): qty 1

## 2021-01-17 NOTE — Evaluation (Signed)
Occupational Therapy Evaluation Patient Details Name: Diamond Rice MRN: [id number] DOB: November 03, 1970 Today's Date: 01/17/2021    History of Present Illness 50 y.o. female with PMH significant for recurrent stroke most recently 20 days ago with residual left-sided weakness, obesity, OSA, DM2, HTN, PVD, CKD 3A, chronic anemia, substance abuse, COPD, DVT/PE, depression. 5/2-5/9, patient was hospitalized for acute CVA and discharged to [facility]. Pt was diagnosed with COVID 14 days ago and was in isolation.  On 5/22, pt was noted to have left facial droop, right-sided numbness, slurred speech, expressive aphasia and was brought to ED for stroke work-up.   Clinical Impression   Pt seen for OT evaluation on this date. Upon arrival to room, pt was awake and sitting upright in bed. Pt was extremely motivated to participate in therapy. Prior to admission, pt was receiving therapy at a SNF following 5/2-5-9 hospitalization. Pt reports that at rehab, she was walking ~81ft with quad cane and working on improving grip strength, and is looking forward to returning to rehab to maximize return to PLOF. Pt reports that she was residing in a first floor apartment with tub/shower (no stairs) prior to previous admission. Per chart review, pt was staying at a friend's house because her lease had ended. At [place]line, pt is MOD-I with quad cane for functional mobility and ADLs. Compared to baseline, pt reports that she continues to present with decreased strength and balance, and requires SUPERVISION/SET-UP for bed-level LB dressing and MIN A for functional mobility of short household distances (~47ft). Pt would benefit from additional skilled OT services to maximize return to PLOF and minimize risk of future falls, injury, caregiver burden, and readmission. Upon discharge, recommend SNF.     Follow Up Recommendations  SNF    Equipment Recommendations  None recommended by OT       Precautions / Restrictions  Precautions Precautions: Fall Restrictions Weight Bearing Restrictions: No      Mobility Bed Mobility Overal bed mobility: Needs Assistance Bed Mobility: Supine to Sit     Supine to sit: Min guard     General bed mobility comments: min guard for safety, no physical assist required.    Transfers Overall transfer level: Needs assistance Equipment used: 1 person hand held assist Transfers: Sit to/from Stand Sit to Stand: Min assist              Balance Overall balance assessment: Needs assistance Sitting-balance support: No upper extremity supported;Feet supported Sitting balance-Leahy Scale: Good Sitting balance - Comments: Good sitting balance while sitting EOB and reaching within BOS   Standing balance support: Single extremity supported;During functional activity Standing balance-Leahy Scale: Poor Standing balance comment: MIN A for unilateral UE support via 1 person hand held assist                           ADL either performed or assessed with clinical judgement   ADL Overall ADL's : Needs assistance/impaired Eating/Feeding: NPO                   Lower Body Dressing: Supervision/safety;Set up;Bed level Lower Body Dressing Details (indicate cue type and reason): Able to don socks in unsupported long sitting position             Functional mobility during ADLs: Minimal assistance (MIN A for HHA to walk ~10 ft)       Vision Patient Visual Report: No change from baseline  Pertinent Vitals/Pain Pain Assessment: No/denies pain        Extremity/Trunk Assessment Upper Extremity Assessment Upper Extremity Assessment: LUE deficits/detail RUE Deficits / Details: Decreased strength and limited shoulder ROM (shoulder AROM 0-80*). Elbow flexion 3+/5 and grip strength 4/5. Able to perform digit opposition with increased time/effort   Lower Extremity Assessment Lower Extremity Assessment: Defer to PT evaluation        Communication Communication Communication: Expressive difficulties   Cognition Arousal/Alertness: Awake/alert Behavior During Therapy: WFL for tasks assessed/performed Overall Cognitive Status: Within Functional Limits for tasks assessed                                 General Comments: Pt extremely motivated to participate in therapy, verbalizing personal goals she has for OT/PT              Home Living Family/patient expects to be discharged to:: Skilled nursing facility                                 Additional Comments: Pt reports that she was residing in a first floor apartment with tub/shower (no stairs). Per chart review, pt was staying at a friend's house prior to previous admission.      Prior Functioning/Environment Level of Independence: Needs assistance  Gait / Transfers Assistance Needed: Pt reports she was using a quad cane to ambulate and was walking ~75 ft with rehab team at SNF. ADL's / Homemaking Assistance Needed: Pt reports she was independent with ADLs at baseline. Pt reports that she was working on strengthening her grip strength at SNF            OT Problem List: Decreased range of motion;Decreased strength;Decreased activity tolerance;Impaired balance (sitting and/or standing);Decreased coordination;Decreased safety awareness;Decreased cognition;Decreased knowledge of use of DME or AE;Decreased knowledge of precautions;Impaired UE functional use      OT Treatment/Interventions: Self-care/ADL training;Therapeutic exercise;Energy conservation;DME and/or AE instruction;Therapeutic activities;Patient/family education    OT Goals(Current goals can be found in the care plan section) Acute Rehab OT Goals Patient Stated Goal: to regain independence OT Goal Formulation: With patient Time For Goal Achievement: 01/31/21 Potential to Achieve Goals: Good ADL Goals Pt Will Perform Grooming: with min guard assist;standing Pt Will  Transfer to Toilet: with min guard assist;ambulating;regular height toilet Pt Will Perform Toileting - Clothing Manipulation and hygiene: with modified independence;sitting/lateral leans  OT Frequency: Min 2X/week    AM-PAC OT "6 Clicks" Daily Activity     Outcome Measure Help from another person eating meals?: None Help from another person taking care of personal grooming?: A Little Help from another person toileting, which includes using toliet, bedpan, or urinal?: A Little Help from another person bathing (including washing, rinsing, drying)?: A Little Help from another person to put on and taking off regular upper body clothing?: A Little Help from another person to put on and taking off regular lower body clothing?: A Little 6 Click Score: 19   End of Session Equipment Utilized During Treatment: Gait belt Nurse Communication: Mobility status  Activity Tolerance: Patient tolerated treatment well Patient left: in chair;with call bell/phone within reach;with chair alarm set  OT Visit Diagnosis: Unsteadiness on feet (R26.81);Other abnormalities of gait and mobility (R26.89);Muscle weakness (generalized) (M62.81)                Time: 1884-1660 OT Time Calculation (min): 27 min  Charges:  OT General Charges $OT Visit: 1 Visit OT Evaluation $OT Eval Moderate Complexity: 1 Mod OT Treatments $Self Care/Home Management : 8-22 mins  Fredirick Maudlin, OTR/L [place]

## 2021-01-17 NOTE — Consult Note (Signed)
NEUROLOGY CONSULTATION NOTE   Date of service: Jan 17, 2021 [name] MRN:  [id number] DOB:  1970/10/09 Reason for consult: recurrent stroke _ _ _   _ __   _ __ _ _  __ __   _ __   __ _  History of Present Illness   50 y.o. female with PMH significant for recurrent stroke most recently 20 days ago with residual left-sided weakness, obesity, OSA, DM2, HTN, PVD, CKD 3A, chronic anemia, substance abuse, COPD, DVT/PE, depression. 5/2-5/9, patient was hospitalized for acute CVA and discharged to [facility] healthcare. Pt was diagnosed with COVID 14 days ago and was in isolation.  On 5/22, pt was noted to have left facial droop, right-sided numbness, slurred speech, expressive aphasia and was brought to ED for stroke work-up.  62 F, h/o  OSA, DM2, HTN, PVD, CKD 3A, chronic anemia, substance abuse, COPD, DVT/PE, depression, stroke/ICH with residual L sided wkness, who was brought in for generalized wkness and severe slurred speech. NIHSS 13, not an alteplase candidate due to history of ICH and recent stroke 20 days ago. Head CT neg for acute abnl. CTA head/neck and perfusion neg for LVO. She was actually in irolation for covid 14 days ago and has been in isolation. She was seen by telespecialists overnight who recommended admission for stroke w/u.  LKW 8:15pm on 01/16/21 when she took her meds at rehab. Later informed staff she was not feeling well and c/o feeling weak all over. On arrival to ED she had L>R weakness, L facial droop, disorientation, and severe dysarthria. She was started on rocephin for possible UTI and has improved significantly since then per bedside RN. MRI images personally reviewed, no acute infarcts, only expected evolution of previously noted subacute L CS infarct.    ROS   10 point review of systems was performed and was negative except as described in HPI.  Past History   Past Medical History:  Diagnosis Date  . Acute ischemic stroke (HCC) 12/27/2020  .  Acute renal failure (ARF) (HCC) 04/02/2018  . Anxiety   . Chronic lower back pain 04/17/2012   "just got over some; catched when I walked"  . COPD (chronic obstructive pulmonary disease) (HCC)   . Critical lower limb ischemia (HCC) 05/27/2018  . Depression   . Headache(784.0) 04/17/2012   "~ qod; lately waking up in am w/one"  . High cholesterol   . Hypertension   . Migraines 04/17/2012  . Obesity   . Sleep apnea    CPAP  . Stroke (HCC)   . Type II diabetes mellitus (HCC) 08/28/2002   Past Surgical History:  Procedure Laterality Date  . ABDOMINAL AORTOGRAM W/LOWER EXTREMITY Left 05/27/2018   Procedure: ABDOMINAL AORTOGRAM W/LOWER EXTREMITY Runoff and Possible Intervention;  Surgeon: Maeola Harman, MD;  Location: [facility] INVASIVE CV LAB;  Service: Cardiovascular;  Laterality: Left;  . APPLICATION OF WOUND VAC Left 06/06/2018   Procedure: APPLICATION OF WOUND VAC;  Surgeon: Maeola Harman, MD;  Location: [facility] OR;  Service: Vascular;  Laterality: Left;  . BALLOON DILATION N/A 12/31/2020   Procedure: BALLOON DILATION;  Surgeon: Kerin Salen, MD;  Location: [facility] ENDOSCOPY;  Service: Gastroenterology;  Laterality: N/A;  . BIOPSY  04/04/2018   Procedure: BIOPSY;  Surgeon: Lynann Bologna, MD;  Location: [facility] ENDOSCOPY;  Service: Endoscopy;;  . BIOPSY  10/27/2020   Procedure: BIOPSY;  Surgeon: Willis Modena, MD;  Location: [facility] ENDOSCOPY;  Service: Endoscopy;;  . BOTOX INJECTION N/A 12/31/2020  Procedure: BOTOX INJECTION;  Surgeon: Ronnette Juniper, MD;  Location: [place];  Service: Gastroenterology;  Laterality: N/A;  . CARDIAC CATHETERIZATION  ~ 2011  . COLONOSCOPY N/A 04/04/2018   Procedure: COLONOSCOPY;  Surgeon: [name], MD;  Location: [facility] ENDOSCOPY;  Service: Endoscopy;  Laterality: N/A;  . COLONOSCOPY WITH PROPOFOL N/A 10/27/2020   Procedure: COLONOSCOPY WITH PROPOFOL;  Surgeon: Arta Silence, MD;  Location: [facility] ENDOSCOPY;  Service: Endoscopy;  Laterality: N/A;  .  ESOPHAGOGASTRODUODENOSCOPY N/A 12/31/2020   Procedure: ESOPHAGOGASTRODUODENOSCOPY (EGD);  Surgeon: Ronnette Juniper, MD;  Location: [place];  Service: Gastroenterology;  Laterality: N/A;  . ESOPHAGOGASTRODUODENOSCOPY (EGD) WITH PROPOFOL N/A 10/27/2020   Procedure: ESOPHAGOGASTRODUODENOSCOPY (EGD) WITH PROPOFOL  WITH POSSIBLE DIL;  Surgeon: Arta Silence, MD;  Location: [facility] ENDOSCOPY;  Service: Endoscopy;  Laterality: N/A;  . FOREIGN BODY REMOVAL  12/31/2020   Procedure: FOREIGN BODY REMOVAL;  Surgeon: Ronnette Juniper, MD;  Location: [place];  Service: Gastroenterology;;  . LOWER EXTREMITY ANGIOGRAM Left 05/27/2018  . PERIPHERAL VASCULAR INTERVENTION Left 05/27/2018   Procedure: PERIPHERAL VASCULAR INTERVENTION;  Surgeon: Waynetta Sandy, MD;  Location: [place] CV LAB;  Service: Cardiovascular;  Laterality: Left;  . POLYPECTOMY  04/04/2018   Procedure: POLYPECTOMY;  Surgeon: [name], MD;  Location: [facility] ENDOSCOPY;  Service: Endoscopy;;  . POLYPECTOMY  10/27/2020   Procedure: POLYPECTOMY;  Surgeon: Arta Silence, MD;  Location: [facility] ENDOSCOPY;  Service: Endoscopy;;  . TRANSMETATARSAL AMPUTATION Left 05/28/2018   Procedure: AMPUTATION TOES THREE, FOUR AND FIVE on left foot;  Surgeon: Waynetta Sandy, MD;  Location: [place];  Service: Vascular;  Laterality: Left;  . WOUND DEBRIDEMENT Left 06/06/2018   Procedure: DEBRIDEMENT WOUND LEFT FOOT;  Surgeon: Waynetta Sandy, MD;  Location: [facility] OR;  Service: Vascular;  Laterality: Left;   Family History  Problem Relation Age of Onset  . Stroke Brother 57  . Stroke Brother 25  . Heart attack Mother 23  . Stroke Father 11   Social History   Socioeconomic History  . Marital status: Single    Spouse name: Not on file  . Number of children: Not on file  . Years of education: Not on file  . Highest education level: Not on file  Occupational History  . Occupation: disabled  Tobacco Use  . Smoking status: Former Smoker     Packs/day: 0.25    Years: 28.00    Pack years: 7.00    Types: Cigarettes  . Smokeless tobacco: Never Used  Vaping Use  . Vaping Use: Never used  Substance and Sexual Activity  . Alcohol use: No    Alcohol/week: 0.0 standard drinks    Comment: rare  . Drug use: Yes    Types: Marijuana, "Crack" cocaine    Comment: marijuana last use days ago; last cocaine several months ago  . Sexual activity: Not Currently    Birth control/protection: None  Other Topics Concern  . Not on file  Social History Narrative   Was a  Quarry manager at [employer]. Lives with godmother.     Social Determinants of Health   Financial Resource Strain: Not on file  Food Insecurity: Not on file  Transportation Needs: Not on file  Physical Activity: Not on file  Stress: Not on file  Social Connections: Not on file   Allergies  Allergen Reactions  . Morphine And Related Hives  . Metformin Diarrhea and Other (See Comments)    "Allergic," per [facility]    Medications   Medications Prior to Admission  Medication Sig Dispense Refill Last Dose  . ACCU-CHEK GUIDE test strip 2 (two) times daily. as directed   01/16/2021 at Unknown time  . Accu-Chek Softclix Lancets lancets 2 (two) times daily.   01/16/2021 at Unknown time  . amLODipine (NORVASC) 10 MG tablet Take 1 tablet (10 mg total) by mouth daily. 30 tablet 0 01/16/2021 at Unknown time  . ARIPiprazole (ABILIFY) 5 MG tablet Take 5 mg by mouth daily.   01/16/2021 at Unknown time  . aspirin EC 81 MG EC tablet Take 1 tablet (81 mg total) by mouth daily.   01/16/2021 at Unknown time  . atorvastatin (LIPITOR) 40 MG tablet Take 2 tablets (80 mg total) by mouth daily at 6 PM.   01/16/2021 at Unknown time  . Blood Glucose Monitoring Suppl (ACCU-CHEK GUIDE) w/Device KIT USE AS DIRECTED TO CHECK BLOOD SUGAR   01/16/2021 at Unknown time  . cloNIDine (CATAPRES) 0.2 MG tablet Take 0.2 mg by mouth 2 (two) times daily. For 7 days   Past Week at Unknown time  . dexamethasone (DECADRON) 6 MG  tablet Take 6 mg by mouth daily. For COVID for 10 days   Past Week at Unknown time  . hydrALAZINE (APRESOLINE) 100 MG tablet Take 1 tablet (100 mg total) by mouth 3 (three) times daily. 90 tablet 0 01/16/2021 at Unknown time  . insulin aspart (NOVOLOG) 100 UNIT/ML injection Inject 0-9 Units into the skin 3 (three) times daily with meals. Correction coverage: Sensitive (thin, NPO, renal) CBG < 70: Implement Hypoglycemia protocol. CBG 70 - 120: 0 units CBG 121 - 150: 1 unit CBG 151 - 200: 2 units CBG 201 - 250: 3 units CBG 251 - 300: 5 units CBG 301 - 350: 7 units CBG 351 - 400: 9 units CBG > 400: call MD.   01/16/2021 at Unknown time  . lisinopril (ZESTRIL) 40 MG tablet Take 1 tablet (40 mg total) by mouth daily. 30 tablet 0 01/16/2021 at Unknown time  . metoprolol succinate (TOPROL-XL) 50 MG 24 hr tablet Take 50 mg by mouth every morning.   01/16/2021 at Unknown time  . mirtazapine (REMERON) 15 MG tablet Take 1 tablet (15 mg total) by mouth at bedtime. For Depression (Patient taking differently: Take 15 mg by mouth at bedtime. For Depression , appetite) 30 tablet 0 01/16/2021 at Unknown time  . pantoprazole (PROTONIX) 40 MG tablet Take 1 tablet (40 mg total) by mouth daily.   01/16/2021 at Unknown time  . amoxicillin-clavulanate (AUGMENTIN) 875-125 MG tablet Take 1 tablet by mouth every 12 (twelve) hours. Discontinue after Jan 04, 2021 doses. (Patient not taking: No sig reported)   Not Taking at Unknown time  . PEG 3350-KCl-NaBcb-NaCl-NaSulf (PEG-3350/ELECTROLYTES) 236 g SOLR SMARTSIG:Milliliter(s) By Mouth   unknown at unknown     Vitals   Vitals:   01/17/21 0802 01/17/21 0821 01/17/21 1021 01/17/21 1257  BP: (!) 192/99 (!) 178/102 (!) 180/88 (!) 193/100  Pulse: 64 63 66 68  Resp: 17   18  Temp:   97.9 F (36.6 C) 98 F (36.7 C)  TempSrc:   Oral Oral  SpO2: 100%  100% 100%     There is no height or weight on file to calculate BMI.  Physical Exam   Physical Exam Gen: A&O x4, NAD CV:  RRR Resp: CTAB  Neuro: *MS: A&O x4. Follows multi-step commands.  *Speech: dysarthric, able to name and repeat *CN:    I: Deferred   II,III: PERRLA, VFF by confrontation  III,IV,VI: EOMI w/o nystagmus, no ptosis   V: Sensation intact from V1 to V3 to LT   VII: Eyelid closure was full.  L UMN facial droop   VIII: Hearing intact to voice   IX,X: Voice normal, palate elevates symmetrically    XI: SCM/trap 5/5 bilat   XII: Tongue protrudes midline, no atrophy or fasciculations  *Motor:   Normal bulk.  No tremor, rigidity or bradykinesia. No pronator drift. RUE and RLE 4/5 throughout. LUE and LLE anti-gravity but drifts to bed *sensory: SILT Coordination: FNF intact bilat Reflexes: 2+ symmetric throughout Gait: deferred   NIHSS  1a Level of Conscious.: 0 1b LOC Questions: 0 1c LOC Commands: 0 2 Best Gaze: 0 3 Visual: 0 4 Facial Palsy: 1 5a Motor Arm - left: 2 5b Motor Arm - Right: 1 6a Motor Leg - Left: 2 6b Motor Leg - Right: 1 7 Limb Ataxia: 0 8 Sensory: 0 9 Best Language: 0 10 Dysarthria: 1 11 Extinct. and Inatten.: 0  TOTAL: 8   Premorbid mRS = 4   Labs   CBC:  Recent Labs  Lab 01/11/21 1709 01/16/21 2316  WBC 10.5 13.4*  NEUTROABS 5.1 9.2*  HGB 10.6* 10.5*  HCT 33.7* 32.6*  MCV 72.3* 71.5*  PLT 362 341    Basic Metabolic Panel:  Lab Results  Component Value Date   NA 135 01/16/2021   K 4.3 01/16/2021   CO2 24 01/16/2021   GLUCOSE 366 (H) 01/16/2021   BUN 33 (H) 01/16/2021   CREATININE 1.17 (H) 01/16/2021   CALCIUM 9.3 01/16/2021   GFRNONAA 58 (L) 01/16/2021   GFRAA >60 02/17/2020   Lipid Panel:  Lab Results  Component Value Date   LDLCALC 60 01/16/2021   HgbA1c:  Lab Results  Component Value Date   HGBA1C 7.6 (H) 01/16/2021   Urine Drug Screen:     Component Value Date/Time   LABOPIA NONE DETECTED 01/16/2021 2316   [place] NONE DETECTED 12/27/2020 1930   COCAINSCRNUR NONE DETECTED 01/16/2021 2316   LABBENZ NONE DETECTED  01/16/2021 2316   LABBENZ NONE DETECTED 12/27/2020 1930   AMPHETMU NONE DETECTED 01/16/2021 2316   AMPHETMU NONE DETECTED 12/27/2020 1930   THCU NONE DETECTED 01/16/2021 2316   THCU POSITIVE (A) 12/27/2020 1930   LABBARB NONE DETECTED 01/16/2021 2316   LABBARB NONE DETECTED 12/27/2020 1930    Alcohol Level     Component Value Date/Time   ETH <10 01/16/2021 2316     Impression   50 y.o. female with PMH significant for recurrent stroke most recently 20 days ago with residual left-sided weakness, obesity, OSA, DM2, HTN, PVD, CKD 3A, chronic anemia, substance abuse, COPD, DVT/PE, depression. 5/2-5/9, patient was hospitalized for acute CVA and discharged to [place]. Pt was diagnosed with COVID 14 days ago and was in isolation.  On 5/22, pt was noted to have left facial droop, right-sided numbness, slurred speech, expressive aphasia and was brought to ED for stroke work-up. Sx favored to be recrudescence, significant improvement after tx with rocephin for possible UTI. Conversion disorder is also on the differential as patient had similar fluctations during prior hospitalization (see notes from Dr. Erlinda Hong 12/30/20).  Recommendations   - No further stroke w/u indicated at this time - Continue ASA $RemoveBefo'81mg'[id number]$  daily and atorvastatin $RemoveBeforeDE'80mg'[id number]$  daily - Continue medical mgmt per primary team  Neurology to sign off, but please re-engage if additional neurologic concerns arise. ______________________________________________________________________   Thank you for the opportunity to take part in the  care of this patient. If you have any further questions, please contact the neurology consultation attending.  Signed,  Su Monks, MD [facility] [phone]  If 7pm- 7am, please page neurology on call as listed in [place].

## 2021-01-17 NOTE — ED Notes (Signed)
Patient back to room from CTA.

## 2021-01-17 NOTE — ED Provider Notes (Signed)
[facility] Emergency Department Provider Note  ____________________________________________  Time seen: Approximately 12:16 AM  I have reviewed the triage vital signs and the nursing notes.   HISTORY  Chief Complaint Code Stroke  Level 5 caveat:  Portions of the history and physical were unable to be obtained due to expressive aphasia   HPI Diamond Rice is a 50 y.o. female with a history of several prior strokes, left-sided weakness, diabetes, obesity, sleep apnea on CPAP, hypertension, hyperlipidemia who presents for evaluation of aphasia.  Last seen normal at 8:15PM.   Code stroke was called per EMS.  Patient complaining of right-sided weakness which is new, slurred speech, expressive aphasia.  Patient was diagnosed with COVID 14 days ago and has been in the isolation unit at her nursing home.  History is limited due to expressive aphasia.  Patient denies headache, chest pain, abdominal pain.  Past Medical History:  Diagnosis Date  . Anxiety   . Chronic lower back pain 04/17/2012   "just got over some; catched when I walked"  . COPD (chronic obstructive pulmonary disease) ([place])   . Critical lower limb ischemia ([place]) 05/27/2018  . Depression   . Headache(784.0) 04/17/2012   "~ qod; lately waking up in am w/one"  . High cholesterol   . Hypertension   . Migraines 04/17/2012  . Obesity   . Sleep apnea    CPAP  . Stroke ([place])   . Type II diabetes mellitus ([place]) 08/28/2002    Patient Active Problem List   Diagnosis Date Noted  . Acute ischemic stroke ([place]) 12/27/2020  . Abnormal finding on lung imaging 12/27/2020  . Severe episode of recurrent major depressive disorder, with psychotic features ([place])   . Acute CVA (cerebrovascular accident) ([place]) 12/06/2019  . AKI (acute kidney injury) ([place]) 12/06/2019  . LVH (left ventricular hypertrophy) 08/10/2019  . Edema of lower extremity 11/26/2018  . Loss of appetite 11/26/2018  . Urinary incontinence  11/26/2018  . Allergic rhinitis 11/14/2018  . Deep venous thrombosis ([place]) 11/14/2018  . Nicotine dependence 11/14/2018  . Hemorrhagic stroke ([place])   . Diastolic dysfunction   . Type 2 diabetes mellitus ([place])   . Anemia of chronic disease   . Chronic obstructive pulmonary disease ([place])   . ICH (intracerebral hemorrhage) ([place]) 08/30/2018  . Hypertensive heart disease without heart failure 07/04/2018  . Nonrheumatic aortic valve insufficiency 07/04/2018  . Esophageal abnormality   . Class 2 severe obesity due to excess calories with serious comorbidity and body mass index (BMI) of 35.0 to 35.9 in adult [facility])   . Peripheral vascular disease ([place])   . History of osteomyelitis   . History of amputation of lesser toe ([place])   . Pulmonary embolism ([place]) 06/22/2018  . Microcytic anemia 06/17/2018  . At risk for adverse drug reaction 06/11/2018  . Critical lower limb ischemia ([place]) 05/27/2018  . GI bleeding 04/02/2018  . Acute renal failure (ARF) ([place]) 04/02/2018  . Hypokalemia 04/02/2018  . Polysubstance abuse ([place]) 04/02/2018  . Diabetic foot ulcer ([place]) 04/02/2018  . Tinea pedis 03/07/2018  . Diabetic peripheral neuropathy ([place]) 01/24/2018  . Cannabis use disorder, moderate, dependence ([place]) 05/19/2017  . Cocaine use disorder, moderate, dependence ([place]) 05/19/2017  . CKD (chronic kidney disease) stage 3, GFR 30-59 ml/min (HCC) 02/17/2017  . Noncompliance with medications 02/17/2017  . Suicide attempt ([place]) 11/06/2016  . Mild cognitive impairment 03/21/2016  . Moderate episode of recurrent major depressive disorder ([employer]) 03/21/2016  . B12 deficiency  03/09/2016  . Iron deficiency anemia 03/07/2016  . History of CVA with residual deficit 12/30/2015  . Schizoaffective disorder, bipolar type ([place]) 10/07/2015  . OSA (obstructive sleep apnea) 06/09/2015  . Vitamin D deficiency 01/06/2015  . Bilateral knee pain 01/05/2015  . Numbness and tingling in right hand 01/05/2015  . Insomnia  12/07/2014  . Right-sided lacunar infarction ([place]) 09/15/2014  . Left hemiparesis ([place]) 09/15/2014  . Dyspnea   . Back pain 09/11/2013  . Psychoactive substance-induced organic mood disorder ([place]) 05/28/2013  . Cocaine abuse with cocaine-induced mood disorder ([place]) 05/28/2013  . Cannabis dependence with cannabis-induced anxiety disorder ([place]) 05/28/2013  . Morbid obesity with BMI of 45.0-49.9, adult ([place]) 04/25/2012  . Chest pain 04/17/2012  . Hypertension 04/17/2012  . DM (diabetes mellitus) with peripheral vascular complication ([place]) 16/05/9603  . Hyperlipidemia 04/17/2012  . Tobacco use 04/17/2012    Past Surgical History:  Procedure Laterality Date  . ABDOMINAL AORTOGRAM W/LOWER EXTREMITY Left 05/27/2018   Procedure: ABDOMINAL AORTOGRAM W/LOWER EXTREMITY Runoff and Possible Intervention;  Surgeon: Waynetta Sandy, MD;  Location: [place] CV [facility];  Service: Cardiovascular;  Laterality: Left;  . APPLICATION OF WOUND VAC Left 06/06/2018   Procedure: APPLICATION OF WOUND VAC;  Surgeon: Waynetta Sandy, MD;  Location: [place];  Service: Vascular;  Laterality: Left;  . BALLOON DILATION N/A 12/31/2020   Procedure: BALLOON DILATION;  Surgeon: Ronnette Juniper, MD;  Location: [place];  Service: Gastroenterology;  Laterality: N/A;  . BIOPSY  04/04/2018   Procedure: BIOPSY;  Surgeon: [name], MD;  Location: [facility] ENDOSCOPY;  Service: Endoscopy;;  . BIOPSY  10/27/2020   Procedure: BIOPSY;  Surgeon: Arta Silence, MD;  Location: [facility] ENDOSCOPY;  Service: Endoscopy;;  . BOTOX INJECTION N/A 12/31/2020   Procedure: BOTOX INJECTION;  Surgeon: Ronnette Juniper, MD;  Location: [place];  Service: Gastroenterology;  Laterality: N/A;  . CARDIAC CATHETERIZATION  ~ 2011  . COLONOSCOPY N/A 04/04/2018   Procedure: COLONOSCOPY;  Surgeon: [name], MD;  Location: [facility] ENDOSCOPY;  Service: Endoscopy;  Laterality: N/A;  . COLONOSCOPY WITH PROPOFOL N/A 10/27/2020   Procedure: COLONOSCOPY WITH  PROPOFOL;  Surgeon: Arta Silence, MD;  Location: [facility] ENDOSCOPY;  Service: Endoscopy;  Laterality: N/A;  . ESOPHAGOGASTRODUODENOSCOPY N/A 12/31/2020   Procedure: ESOPHAGOGASTRODUODENOSCOPY (EGD);  Surgeon: Ronnette Juniper, MD;  Location: [place];  Service: Gastroenterology;  Laterality: N/A;  . ESOPHAGOGASTRODUODENOSCOPY (EGD) WITH PROPOFOL N/A 10/27/2020   Procedure: ESOPHAGOGASTRODUODENOSCOPY (EGD) WITH PROPOFOL  WITH POSSIBLE DIL;  Surgeon: Arta Silence, MD;  Location: [facility] ENDOSCOPY;  Service: Endoscopy;  Laterality: N/A;  . FOREIGN BODY REMOVAL  12/31/2020   Procedure: FOREIGN BODY REMOVAL;  Surgeon: Ronnette Juniper, MD;  Location: [place];  Service: Gastroenterology;;  . LOWER EXTREMITY ANGIOGRAM Left 05/27/2018  . PERIPHERAL VASCULAR INTERVENTION Left 05/27/2018   Procedure: PERIPHERAL VASCULAR INTERVENTION;  Surgeon: Waynetta Sandy, MD;  Location: [place] CV LAB;  Service: Cardiovascular;  Laterality: Left;  . POLYPECTOMY  04/04/2018   Procedure: POLYPECTOMY;  Surgeon: [name], MD;  Location: [facility] ENDOSCOPY;  Service: Endoscopy;;  . POLYPECTOMY  10/27/2020   Procedure: POLYPECTOMY;  Surgeon: Arta Silence, MD;  Location: [facility] ENDOSCOPY;  Service: Endoscopy;;  . TRANSMETATARSAL AMPUTATION Left 05/28/2018   Procedure: AMPUTATION TOES THREE, FOUR AND FIVE on left foot;  Surgeon: Waynetta Sandy, MD;  Location: [place];  Service: Vascular;  Laterality: Left;  . WOUND DEBRIDEMENT Left 06/06/2018   Procedure: DEBRIDEMENT WOUND LEFT FOOT;  Surgeon: Waynetta Sandy, MD;  Location: [facility]  OR;  Service: Vascular;  Laterality: Left;    Prior to Admission medications   Medication Sig Start Date End Date Taking? Authorizing Provider  ACCU-CHEK GUIDE test strip 2 (two) times daily. as directed 02/06/20  Yes [provider]  Accu-Chek Softclix Lancets lancets 2 (two) times daily. 02/06/20  Yes [provider]  amLODipine (NORVASC) 10 MG tablet Take 1 tablet  (10 mg total) by mouth daily. 01/22/20  Yes Wert, Margreta Journey, NP  ARIPiprazole (ABILIFY) 5 MG tablet Take 5 mg by mouth daily. 08/28/20  Yes [provider]  aspirin EC 81 MG EC tablet Take 1 tablet (81 mg total) by mouth daily. 09/21/18  Yes Donzetta Starch, NP  atorvastatin (LIPITOR) 40 MG tablet Take 2 tablets (80 mg total) by mouth daily at 6 PM. 01/03/21  Yes Hongalgi, Lenis Dickinson, MD  Blood Glucose Monitoring Suppl (ACCU-CHEK GUIDE) w/Device KIT USE AS DIRECTED TO CHECK BLOOD SUGAR 02/06/20  Yes [provider]  cloNIDine (CATAPRES) 0.2 MG tablet Take 0.2 mg by mouth 2 (two) times daily. For 7 days 01/11/21 01/18/21 Yes [provider]  dexamethasone (DECADRON) 6 MG tablet Take 6 mg by mouth daily. For COVID for 10 days   Yes [provider]  hydrALAZINE (APRESOLINE) 100 MG tablet Take 1 tablet (100 mg total) by mouth 3 (three) times daily. 01/22/20  Yes Wert, Margreta Journey, NP  insulin aspart (NOVOLOG) 100 UNIT/ML injection Inject 0-9 Units into the skin 3 (three) times daily with meals. Correction coverage: Sensitive (thin, NPO, renal) CBG < 70: Implement Hypoglycemia protocol. CBG 70 - 120: 0 units CBG 121 - 150: 1 unit CBG 151 - 200: 2 units CBG 201 - 250: 3 units CBG 251 - 300: 5 units CBG 301 - 350: 7 units CBG 351 - 400: 9 units CBG > 400: call MD. 01/03/21  Yes Hongalgi, Lenis Dickinson, MD  lisinopril (ZESTRIL) 40 MG tablet Take 1 tablet (40 mg total) by mouth daily. 01/22/20  Yes Royal Hawthorn, NP  metoprolol succinate (TOPROL-XL) 50 MG 24 hr tablet Take 50 mg by mouth every morning. 08/02/20  Yes [provider]  mirtazapine (REMERON) 15 MG tablet Take 1 tablet (15 mg total) by mouth at bedtime. For Depression Patient taking differently: Take 15 mg by mouth at bedtime. For Depression , appetite 01/22/20  Yes Wert, Margreta Journey, NP  pantoprazole (PROTONIX) 40 MG tablet Take 1 tablet (40 mg total) by mouth daily. 01/03/21  Yes Hongalgi, Lenis Dickinson, MD   amoxicillin-clavulanate (AUGMENTIN) 875-125 MG tablet Take 1 tablet by mouth every 12 (twelve) hours. Discontinue after Jan 04, 2021 doses. Patient not taking: No sig reported 01/03/21   Modena Jansky, MD  cloNIDine (CATAPRES) 0.1 MG tablet Take 1 tablet (0.1 mg total) by mouth 2 (two) times daily. Patient not taking: No sig reported 01/03/21   Modena Jansky, MD  PEG 3350-KCl-NaBcb-NaCl-NaSulf (PEG-3350/ELECTROLYTES) 236 g SOLR SMARTSIG:Milliliter(s) By Mouth 01/13/21   [provider]    Allergies Morphine and related and Metformin  Family History  Problem Relation Age of Onset  . Stroke Brother 24  . Stroke Brother 104  . Heart attack Mother 3  . Stroke Father 53    Social History Social History   Tobacco Use  . Smoking status: Former Smoker    Packs/day: 0.25    Years: 28.00    Pack years: 7.00    Types: Cigarettes  . Smokeless tobacco: Never Used  Vaping Use  . Vaping Use: Never used  Substance Use Topics  . Alcohol use: No    Alcohol/week: 0.0 standard drinks    Comment: rare  . Drug use: Yes    Types: Marijuana, "Crack" cocaine    Comment: marijuana last use days ago; last cocaine several months ago    Review of Systems  Constitutional: Negative for fever. Eyes: Negative for visual changes. ENT: Negative for sore throat. Neck: No neck pain  Cardiovascular: Negative for chest pain. Respiratory: Negative for shortness of breath. Gastrointestinal: Negative for abdominal pain, vomiting or diarrhea. Genitourinary: Negative for dysuria. Musculoskeletal: Negative for back pain. Skin: Negative for rash. Neurological: Negative for headaches. + aphasia and R sided weakness Psych: No SI or HI  ____________________________________________   PHYSICAL EXAM:  VITAL SIGNS: ED Triage Vitals [01/16/21 2327]  Enc Vitals Group     BP (!) 154/92     Pulse Rate 72     Resp 18     Temp 98.8 F (37.1 C)     Temp Source Oral     SpO2 99 %     Weight       Height      Head Circumference      Peak Flow      Pain Score 0     Pain Loc      Pain Edu?      Excl. in New Site?     Constitutional: Alert and in no apparent distress. HEENT:      Head: Normocephalic and atraumatic.         Eyes: Conjunctivae are normal. Sclera is non-icteric.       Mouth/Throat: Mucous membranes are moist.       Neck: Supple with no signs of meningismus. Cardiovascular: Regular rate and rhythm. No murmurs, gallops, or rubs. 2+ symmetrical distal pulses are present in all extremities. No JVD. Respiratory: Normal respiratory effort. Lungs are clear to auscultation bilaterally.  Gastrointestinal: Soft, non tender, and non distended. Musculoskeletal:  No edema, cyanosis, or erythema of extremities. Neurologic: Aphasic, L sided facial droop, patient with weakness x 4 Skin: Skin is warm, dry and intact. No rash noted. Psychiatric: Mood and affect are normal. Speech and behavior are normal.  ____________________________________________   LABS (all labs ordered are listed, but only abnormal results are displayed)  Labs Reviewed  CBC - Abnormal; Notable for the following components:      Result Value   WBC 13.4 (*)    Hemoglobin 10.5 (*)    HCT 32.6 (*)    MCV 71.5 (*)    MCH 23.0 (*)    RDW 15.9 (*)    All other components within normal limits  COMPREHENSIVE METABOLIC PANEL - Abnormal; Notable for the following components:   Glucose, Bld 366 (*)    BUN 33 (*)    Creatinine, Ser 1.17 (*)    Albumin 3.3 (*)    GFR, Estimated 58 (*)    All other components within normal limits  CBG MONITORING, ED - Abnormal; Notable for the following components:   Glucose-Capillary 357 (*)    All other components within normal limits  RESP PANEL BY RT-PCR (FLU A&B, COVID) ARPGX2  ETHANOL  PROTIME-INR  APTT  DIFFERENTIAL  URINE DRUG SCREEN, QUALITATIVE ([facility] ONLY)  URINALYSIS, ROUTINE W REFLEX MICROSCOPIC  POC URINE PREG, ED    ____________________________________________  EKG  ED ECG REPORT I, Rudene Re, the attending physician, personally viewed and interpreted this ECG.  Sinus rhythm with a rate of 74, normal intervals, normal axis,  anterior Q waves with T wave inversions in the lateral leads.  Unchanged when compared to prior.   ____________________________________________  RADIOLOGY  I have personally reviewed the images performed during this visit and I agree with the Radiologist's read.   Interpretation by Radiologist:  CT HEAD CODE STROKE WO CONTRAST  Result Date: 01/16/2021 CLINICAL DATA:  Code stroke. Left facial droop and expressive aphasia EXAM: CT HEAD WITHOUT CONTRAST TECHNIQUE: Contiguous axial images were obtained from the base of the skull through the vertex without intravenous contrast. COMPARISON:  None. FINDINGS: Brain: There is no mass, hemorrhage or extra-axial collection. The size and configuration of the ventricles and extra-axial CSF spaces are normal. There is hypoattenuation of the periventricular white matter, most commonly indicating chronic ischemic microangiopathy. 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 Vascular: No abnormal hyperdensity of the major intracranial arteries or dural venous sinuses. No intracranial atherosclerosis. Skull: The visualized skull base, calvarium and extracranial soft tissues are normal. Sinuses/Orbits: No fluid levels or advanced mucosal thickening of the visualized paranasal sinuses. No mastoid or middle ear effusion. The orbits are normal. ASPECTS [name] Stroke Program Early CT Score) - Ganglionic level infarction (caudate, lentiform nuclei, internal capsule, insula, M1-M3 cortex): 7 - Supraganglionic infarction (M4-M6 cortex): 3 Total score (0-10 with 10 being normal): 10 IMPRESSION: 1. No acute intracranial abnormality. 2. ASPECTS is 10. 3. Multiple old small vessel infarcts of the left cerebellum and left  thalamus. Advanced chronic ischemic changes for age. These results were called by telephone at the time of interpretation on 01/16/2021 at 11:28 pm to provider [facility] , who verbally acknowledged these results. Electronically Signed   By: Ulyses Jarred M.D.   On: 01/16/2021 23:28   CT ANGIO HEAD NECK W WO CM W PERF (CODE STROKE)  Result Date: 01/17/2021 CLINICAL DATA:  Right-sided numbness and slurred speech. Left facial droop. EXAM: CT ANGIOGRAPHY HEAD AND NECK TECHNIQUE: Multidetector CT imaging of the head and neck was performed using the standard protocol during bolus administration of intravenous contrast. Multiplanar CT image reconstructions and MIPs were obtained to evaluate the vascular anatomy. Carotid stenosis measurements (when applicable) are obtained utilizing NASCET criteria, using the distal internal carotid diameter as the denominator. CONTRAST:  163m OMNIPAQUE IOHEXOL 350 MG/ML SOLN COMPARISON:  None. FINDINGS: CTA NECK FINDINGS SKELETON: There is no bony spinal canal stenosis. No lytic or blastic lesion. OTHER NECK: Normal pharynx, larynx and major salivary glands. No cervical lymphadenopathy. Unremarkable thyroid gland. UPPER CHEST: No pneumothorax or pleural effusion. No nodules or masses. AORTIC ARCH: There is calcific atherosclerosis of the aortic arch. There is no aneurysm, dissection or hemodynamically significant stenosis of the visualized portion of the aorta. Conventional 3 vessel aortic branching pattern. The visualized proximal subclavian arteries are widely patent. RIGHT CAROTID SYSTEM: Normal without aneurysm, dissection or stenosis. LEFT CAROTID SYSTEM: Normal without aneurysm, dissection or stenosis. VERTEBRAL ARTERIES: Left dominant configuration. Both origins are clearly patent. There is no dissection, occlusion or flow-limiting stenosis to the skull base (V1-V3 segments). CTA HEAD FINDINGS POSTERIOR CIRCULATION: --Vertebral arteries: Normal V4 segments. --Inferior  cerebellar arteries: Normal. --Basilar artery: Normal. --Superior cerebellar arteries: Normal. --Posterior cerebral arteries (PCA): Normal. ANTERIOR CIRCULATION: --Intracranial internal carotid arteries: Normal. --Anterior cerebral arteries (ACA): Normal. Both A1 segments are present. Patent anterior communicating artery (a-comm). --Middle cerebral arteries (MCA): Normal. VENOUS SINUSES: As permitted by contrast timing, patent. ANATOMIC VARIANTS: Fetal origins of both posterior cerebral arteries. Review of the MIP images confirms the above findings.  IMPRESSION: 1. No emergent large vessel occlusion or high-grade stenosis of the intracranial arteries. Aortic Atherosclerosis (ICD10-I70.0). Electronically Signed   By: Ulyses Jarred M.D.   On: 01/17/2021 00:09     ____________________________________________   PROCEDURES  Procedure(s) performed:yes .1-3 Lead EKG Interpretation Performed by: Rudene Re, MD Authorized by: Rudene Re, MD     Interpretation: non-specific     ECG rate assessment: normal     Rhythm: sinus rhythm     Ectopy: none     Conduction: normal     Critical Care performed:  None ____________________________________________   INITIAL IMPRESSION / ASSESSMENT AND PLAN / ED COURSE  50 y.o. female with a history of several prior strokes, left-sided weakness, diabetes, obesity, sleep apnea on CPAP, hypertension, hyperlipidemia who presents for evaluation of aphasia.  Last seen normal at 8:15PM.   Code stroke was called per EMS.  Patient has weakness in of all 4 extremities with a known history of a stroke in the past with left-sided weakness.  She was evaluated by neurology who recommended CTA of the head and neck with perfusion scans.  No LVO or head bleed.  They recommended admission for CVA work-up.  Labs show leukocytosis, mildly elevated creatinine and hyperglycemia with no signs of DKA with glucose of 366. Will give IVF. Will check UA to rule out UTI. Will  admit to hospitalist.  Patient placed on telemetry for monitoring of cardiorespiratory status.  Old medical records reviewed including patient's recent admission in the beginning of the month for another stroke.      _____________________________________________ Please note:  Patient was evaluated in Emergency Department today for the symptoms described in the history of present illness. Patient was evaluated in the context of the global COVID-19 pandemic, which necessitated consideration that the patient might be at risk for infection with the SARS-CoV-2 virus that causes COVID-19. Institutional protocols and algorithms that pertain to the evaluation of patients at risk for COVID-19 are in a state of rapid change based on information released by regulatory bodies including the CDC and federal and state organizations. These policies and algorithms were followed during the patient's care in the ED.  Some ED evaluations and interventions may be delayed as a result of limited staffing during the pandemic.   [place] Controlled Substance Database was reviewed by me. ____________________________________________   FINAL CLINICAL IMPRESSION(S) / ED DIAGNOSES   Final diagnoses:  Cerebrovascular accident (CVA), unspecified mechanism ([place])      NEW MEDICATIONS STARTED DURING THIS VISIT:  ED Discharge Orders    None       Note:  This document was prepared using Dragon voice recognition software and may include unintentional dictation errors.    Alfred Levins, [place], MD 01/17/21 [phone]

## 2021-01-17 NOTE — Progress Notes (Signed)
PROGRESS NOTE  Diamond Rice  DOB: 1971-04-03  PCP: System, Provider Not In DDU:[id number]  DOA: 01/16/2021  LOS: 0 days  Hospital Day: 2   Chief Complaint  Patient presents with  . Code Stroke    Brief narrative: Diamond Rice is a 50 y.o. female with PMH significant for recurrent stroke most recently 20 days ago with residual left-sided weakness, history of ICH, obesity, OSA, DM2, HTN, PVD, CKD 3A, chronic anemia, substance abuse, COPD, DVT/PE, depression.  Patient presented to the ED on 5/22 with new focal neurologic deficits. 5/2-5/9, patient was hospitalized for acute CVA and discharged to [facility]. Diagnosed with COVID 14 days ago and was in isolation. Last seen normal around 8:15 PM on 5/22.  Few hours later, she was noted to have left facial droop, right-sided numbness, slurred speech, expressive aphasia and hence brought to ED.  In the ED, patient was hemodynamically stable except for blood pressure elevated 154/92 Labs with glucose level elevated 366, creatinine 1.17 COVID PCR positive. CT head without acute abnormality but shows multiple old infarcts.   CT angio head and neck without acute abnormality.  Neurology was consulted in the ED and recommended against tPA due to recent stroke and history of hemorrhagic stroke as well.   Patient was admitted to hospitalist service for further stroke work-up. Urinalysis obtained on admission showed clear yellow urine with trace leukocytes, many bacteria and nitrate positive.  Subjective: Patient was seen and examined this morning.  Middle-aged African-American female.  Propped up in bed.  Not in distress.  She is concerned with dysarthria and new worsening left-sided weakness. Chart reviewed Afebrile, heart rate in 60s, blood pressure elevated to 192/99 this morning.  Currently allowed for permissive hypertension  Assessment/Plan: Acute CVA History of recurrent strokes -Patient presented with acute strokelike  symptoms including left cervical droop, right-sided numbness, slurred speech, expressive aphasia. -Pt with h/o 4 prior strokes: 3 ischemic, 1 hemorrhagic. Pt has chronic L sided deficits from the hemorrhagic stroke. Most recent stroke was ischemic in April of 2021. Probably related to poor control of risk factors. -CT head and CT angio head and neck were without acute abnormality. -Seen by neurology who recommend against tPA given recency of stroke and history of hemorrhagic stroke. -Continue aspirin and statin -Echo was done recently. -5/3, blood work showed A1c 6.6 and LDL 72, LDL 60. -Continue telemetry monitoring. -Neurology, PT/OT/ST eval. -Allow for permissive HTN (systolic <237 and diastolic <628)  Essential hypertension -Holding home amlodipine, clonidine, hydralazine, lisinopril, metoprolol in the setting of permissive hypertension and current n.p.o. status. -Continue to monitor blood pressure.  UTI -Urinalysis obtained on admission showed clear yellow urine with trace leukocytes, many bacteria and nitrate positive.  Urine culture sent. -White count elevated to 13.4 on presentation. -Start on IV Rocephin. Recent Labs  Lab 01/11/21 1709 01/16/21 2316  WBC 10.5 13.4*   Recent COVID infection -Patient tested positive for COVID 14 days ago per reports -Outside the window for precautions at this time.  -Chest x-ray normal  Mild microcytic anemia -Hemoglobin currently within 10-11.  Trend CBC. -Continue Protonix Recent Labs    12/27/20 1755 12/29/20 0230 01/04/21 1245 01/11/21 1709 01/16/21 2316  HGB 13.9 12.3 11.9* 10.6* 10.5*  MCV  --  73.0* 71.4* 72.3* 71.5*   CKD 3A Creatinine stable at 1.17 Recent Labs    09/08/20 1605 10/27/20 1302 12/27/20 1749 12/27/20 1755 12/29/20 0230 12/30/20 0406 12/31/20 0354 01/04/21 1245 01/11/21 1709 01/16/21 2316  BUN 14 11  11 12 11 11 10 17  24* 33*  CREATININE 1.48* 1.20* 1.04* 0.90 1.02* 1.00 1.05* 1.03* 1.11*  1.17*   Type 2 diabetes mellitus Hyperglycemia -A1c 6.6 on 5/3 -Not on antidiabetic regimen at home. -Blood sugar level seems to be consistently elevated to close to 300.  Not sure if patient is still taking Decadron that was meant for 10 days for COVID.  Currently on sliding scale insulin with Accu-Cheks. -I will add Lantus 10 units daily from this morning. Recent Labs  Lab 01/16/21 2310 01/17/21 0323 01/17/21 0918  GLUCAP 357* 289* 172*   PVD Hyperlipidemia -Continue home statin  OSA -Continue CPAP  Depression Schizoaffective disorder -Continue home Abilify when tolerating p.o.  Mobility: PT/OT eval pending Code Status:   Code Status: Full Code  Nutritional status: There is no height or weight on file to calculate BMI.     Diet Order            Diet NPO time specified  Diet effective now                 DVT prophylaxis: enoxaparin (LOVENOX) injection 30 mg Start: 01/17/21 0100   Antimicrobials:  IV Rocephin Fluid: Can stop IV hydration this morning Consultants: Neurology Family Communication:  Not at bedside  Status is: Inpatient  Remains inpatient appropriate because: Stroke work-up  Dispo: The patient is from: SNF              Anticipated d/c is to: Back to SNF most likely in 2 to 3 days              Patient currently is not medically stable to d/c.   Difficult to place patient No     Infusions:  . cefTRIAXone (ROCEPHIN)  IV Stopped (01/17/21 0322)    Scheduled Meds: .  stroke: mapping our early stages of recovery book   Does not apply Once  . ARIPiprazole  5 mg Oral Daily  . aspirin EC  81 mg Oral Daily  . [START ON 01/18/2021] atorvastatin  80 mg Oral q1800  . enoxaparin (LOVENOX) injection  30 mg Subcutaneous Q12H  . insulin aspart  0-15 Units Subcutaneous TID WC  . insulin aspart  0-5 Units Subcutaneous QHS  . insulin glargine  10 Units Subcutaneous Daily  . mouth rinse  15 mL Mouth Rinse BID  . pantoprazole  40 mg Oral Daily     Antimicrobials: Anti-infectives (From admission, onward)   Start     Dose/Rate Route Frequency Ordered Stop   01/17/21 0215  cefTRIAXone (ROCEPHIN) 1 g in sodium chloride 0.9 % 100 mL IVPB        1 g 200 mL/hr over 30 Minutes Intravenous Every 24 hours 01/17/21 0130        PRN meds: acetaminophen **OR** acetaminophen (TYLENOL) oral liquid 160 mg/5 mL **OR** acetaminophen, senna-docusate   Objective: Vitals:   01/17/21 0821 01/17/21 1021  BP: (!) 178/102 (!) 180/88  Pulse: 63 66  Resp:    Temp:  97.9 F (36.6 C)  SpO2:  100%    Intake/Output Summary (Last 24 hours) at 01/17/2021 1112 Last data filed at 01/17/2021 0900 Gross per 24 hour  Intake 100 ml  Output 600 ml  Net -500 ml   There were no vitals filed for this visit. Weight change:  There is no height or weight on file to calculate BMI.   Physical Exam: General exam: Pleasant middle-aged African-American female.  Not in distress but stressed because  of recurrent stroke and deficit. Skin: No rashes, lesions or ulcers. HEENT: Atraumatic, normocephalic, no obvious bleeding Lungs: Clear to auscultation bilaterally CVS: Regular rate and rhythm, no murmur GI/Abd soft, nontender, nondistended, bowel sound present CNS: Alert, awake, oriented x3, dysarthric.  Left hemiparesis  Psychiatry: Depressed look Extremities: No pedal edema, no calf tenderness  Data Review: I have personally reviewed the laboratory data and studies available.  Recent Labs  Lab 01/11/21 1709 01/16/21 2316  WBC 10.5 13.4*  NEUTROABS 5.1 9.2*  HGB 10.6* 10.5*  HCT 33.7* 32.6*  MCV 72.3* 71.5*  PLT 362 388   Recent Labs  Lab 01/11/21 1709 01/16/21 2316  NA 134* 135  K 3.8 4.3  CL 100 100  CO2 27 24  GLUCOSE 200* 366*  BUN 24* 33*  CREATININE 1.11* 1.17*  CALCIUM 9.4 9.3    F/u labs ordered Unresulted Labs (From admission, onward)          Start     Ordered   01/24/21 0500  Creatinine, serum  (enoxaparin (LOVENOX)    CrCl  >/= 30 with major trauma, spinal cord injury, or selected orthopedic surgery)  Weekly,   STAT     Comments: while on enoxaparin therapy.    01/17/21 0039   01/18/21 0500  CBC with Differential/Platelet  Daily,   R      01/17/21 1110   01/18/21 3532  Basic metabolic panel  Daily,   R      01/17/21 1110   01/18/21 0500  Magnesium  Tomorrow morning,   R        01/17/21 1110   01/18/21 0500  Phosphorus  Tomorrow morning,   R        01/17/21 1110   01/17/21 0857  Urine Culture  Once,   R       Question:  List patient's active antibiotics  Answer:  can add on previously collected urine.   01/17/21 0857          Signed, Terrilee Croak, MD [facility] 01/17/2021

## 2021-01-17 NOTE — Evaluation (Signed)
Physical Therapy Evaluation Patient Details Name: Diamond Rice MRN: [id number] DOB: 27-Feb-1971 Today's Date: 01/17/2021   History of Present Illness  49 y.o. female with PMH significant for recurrent stroke most recently 20 days ago with residual left-sided weakness, obesity, OSA, DM2, HTN, PVD, CKD 3A, chronic anemia, substance abuse, COPD, DVT/PE, depression. 5/2-5/9, patient was hospitalized for acute CVA and discharged to [facility]. Pt was diagnosed with COVID 14 days ago and was in isolation.  On 5/22, pt was noted to have left facial droop, right-sided numbness, slurred speech, expressive aphasia and was brought to ED for stroke work-up.  Clinical Impression  Patient seated in recliner beginning/end of session; alert and oriented to basic information.  Follows commands and eager for participation with session.  Mildly dysarthric with mild word-finding difficulties noted during conversation; however, able to effectively make wants/needs known with increased time/effort to optimize speech fluency and intelligibility.  Denies pain at this time.  L UE > LE hemiparesis noted (grossly 2+ to 3-/5 throughout); patient feels current abilities are "a little worse" than baseline for her.  Currently able to complete sit/stand, basic transfers and gait (30') with small-base quad cane, min assist.  Demonstrates exaggerated weight shift to R LE throughout gait cycle; 3-point gait pattern leading with R LE.  Mild abdominal flaring, moderate L genu recurvatum in loading phase of gait cycle (unable to extinguish despite cuing/efforts for sustained L knee flexion in loading).May benefit from use of orthotic device to optimize gait mechanics moving forward. Would benefit from skilled PT to address above deficits and promote optimal return to PLOF.; recommend transition to STR upon discharge from acute hospitalization.     Follow Up Recommendations SNF    Equipment Recommendations        Recommendations for Other Services       Precautions / Restrictions Precautions Precautions: Fall Restrictions Weight Bearing Restrictions: No      Mobility  Bed Mobility Overal bed mobility: Needs Assistance Bed Mobility: Supine to Sit     Supine to sit: Min guard     General bed mobility comments: seated in recliner beginning/end of treatment session    Transfers Overall transfer level: Needs assistance Equipment used: Quad cane Transfers: Sit to/from Stand Sit to Stand: Min guard         General transfer comment: excessive weight shift to R LE with movement transitions; however, good efforts to integrate L hemi-body as able  Ambulation/Gait Ambulation/Gait assistance: Min guard Gait Distance (Feet): 30 Feet Assistive device: Quad cane       General Gait Details: exaggerated weight shift to R LE throughout gait cycle; 3-point gait pattern leading with R LE.  Mild abdominal flaring, moderate L genu recurvatum in loading phase of gait cycle (unable to extinguish despite cuing/efforts for sustained L knee flexion in loading).  Stairs            Wheelchair Mobility    Modified Rankin (Stroke Patients Only)       Balance Overall balance assessment: Needs assistance Sitting-balance support: No upper extremity supported;Feet supported Sitting balance-Leahy Scale: Good Sitting balance - Comments: Good sitting balance while sitting EOB and reaching within BOS   Standing balance support: Single extremity supported Standing balance-Leahy Scale: Fair Standing balance comment: MIN A for unilateral UE support via 1 person hand held assist                             Pertinent  Vitals/Pain Pain Assessment: No/denies pain    Home Living Family/patient expects to be discharged to:: Skilled nursing facility                 Additional Comments: Per patient, was living with friend in first-floor apartment at recent baseline.    Prior  Function Level of Independence: Independent with assistive device(s)   Gait / Transfers Assistance Needed: Pt reports she was using a quad cane to ambulate and was walking ~75 ft with rehab team at SNF.  ADL's / Homemaking Assistance Needed: Pt reports she was independent with ADLs at baseline. Pt reports that she was working on strengthening her grip strength at SNF  Comments: Ambulatory with large-base quad cane at baseline; sup/mod indep for ADLs, household mobilization.  Denies fall history.  Most recently at [facility] for STR since most recent CVA (approx two weeks prior to this admission)     Hand Dominance   Dominant Hand: Right    Extremity/Trunk Assessment   Upper Extremity Assessment Upper Extremity Assessment: LUE deficits/detail RUE Deficits / Details: Decreased strength and limited shoulder ROM (shoulder AROM 0-80*). Elbow flexion 3+/5 and grip strength 4/5. Able to perform digit opposition with increased time/effort LUE Deficits / Details: Decreased strength and limited shoulder ROM (shoulder AROM 0-80*). Elbow flexion 3+/5 and grip strength 4/5. Able to perform digit opposition with increased time/effort    Lower Extremity Assessment Lower Extremity Assessment: LLE deficits/detail LLE Deficits / Details: L LE grossly 3-/5 throughout, denies sensory deficit at this time       Communication   Communication: Expressive difficulties (able to make wants/needs known with increased time)  Cognition Arousal/Alertness: Awake/alert Behavior During Therapy: WFL for tasks assessed/performed Overall Cognitive Status: Within Functional Limits for tasks assessed                                 General Comments: Pt extremely motivated to participate in therapy, verbalizing personal goals she has for OT/PT      General Comments      Exercises Other Exercises Other Exercises: Reviewed mechanics of gait pattern/performance; educated in forced use of L hemi-body to  maximize recovery and function; discussed use of L LE bracing to optmize conrol/mechanics of  L knee (does report using AFO previously, recently using KAFO at STR recently). Other Exercises: Assisted with set up of glucerna and pudding cup in room; dep for opening/manipulation of containers.  Mod indep for self-feeding. Other Exercises: Purewick removed during session; left off end of session given ambulatory abilities.  Encouraged ambulation to/from [facility] or bathroom with staff as needed for toileting efforts.  Patient/CVA informed/aware   Assessment/Plan    PT Assessment Patient needs continued PT services  PT Problem List Decreased strength;Decreased balance;Decreased activity tolerance;Decreased mobility;Decreased knowledge of use of DME;Decreased knowledge of precautions;Decreased range of motion;Decreased coordination;Decreased cognition;Decreased safety awareness       PT Treatment Interventions DME instruction;Gait training;Therapeutic activities;Functional mobility training;Therapeutic exercise;Balance training;Patient/family education;Neuromuscular re-education    PT Goals (Current goals can be found in the Care Plan section)  Acute Rehab PT Goals Patient Stated Goal: to regain independence PT Goal Formulation: With patient Time For Goal Achievement: 01/31/21 Potential to Achieve Goals: Good    Frequency 7X/week   Barriers to discharge        Co-evaluation               AM-PAC PT "6 Clicks" Mobility  Outcome  Measure Help needed turning from your back to your side while in a flat bed without using bedrails?: None Help needed moving from lying on your back to sitting on the side of a flat bed without using bedrails?: None Help needed moving to and from a bed to a chair (including a wheelchair)?: A Little Help needed standing up from a chair using your arms (e.g., wheelchair or bedside chair)?: A Little Help needed to walk in hospital room?: A Little Help needed climbing  3-5 steps with a railing? : A Little 6 Click Score: 20    End of Session Equipment Utilized During Treatment: Gait belt Activity Tolerance: Patient tolerated treatment well Patient left: in chair;with call bell/phone within reach;with chair alarm set Nurse Communication: Mobility status PT Visit Diagnosis: Unsteadiness on feet (R26.81);Muscle weakness (generalized) (M62.81);Difficulty in walking, not elsewhere classified (R26.2)    Time: 0347-4259 PT Time Calculation (min) (ACUTE ONLY): 23 min   Charges:   PT Evaluation $PT Eval Moderate Complexity: 1 Mod PT Treatments $Therapeutic Activity: 8-22 mins        Jocilyn Trego H. Owens Shark, PT, DPT, NCS 01/17/21, 4:00 PM [phone]

## 2021-01-17 NOTE — ED Notes (Signed)
Patient transported to MRI 

## 2021-01-17 NOTE — Consult Note (Signed)
[facility] TeleNeurology Consult Services   Date of Service:   01/16/2021 23:14:48  Diagnosis:     .  R53.1 - Weakness     .  R47.81 - Slurred speech  Impression:     . 46 F, h/o HTN, HL, DM, stroke/ICH with residual L sided wkness, who was brought in for generalized wkness and severe slurred speech. NIHSS 13, not an alteplase candidate due to history of ICH and recent stroke 20 days ago. Head CT neg for acute abnl. CTA head/neck and perfusion neg for LVO.              PLAN       - admit for medical/metabolic workup       - MRI brain w/o contrast       - neuro to follow  Metrics: Last Known Well: 01/16/2021 20:15:00 [facility] Notification Time: 01/16/2021 23:14:48 Arrival Time: 01/16/2021 23:07:00 Stamp Time: 01/16/2021 23:14:48 Initial Response Time: 01/16/2021 23:17:46 Symptoms: generalized wkness, slurred speech. NIHSS Start Assessment Time: 01/16/2021 23:20:13 Patient is not a candidate for Thrombolytic. Thrombolytic Medical Decision: 01/16/2021 23:27:22 Patient was not deemed candidate for Thrombolytic because of following reasons: Current or Previous ICH. Stroke in last 3 months.  CT head was reviewed.  ED Physician notified of diagnostic impression and management plan on 01/16/2021 23:30:09  Advanced Imaging: CTA Head and Neck Completed.  CTP Completed.  LVO:No  Patient doesn't meet criteria for emergent NIR consideration   Our recommendations are outlined below.  Recommendations:      .  Stroke/Telemetry Floor     .  Neuro Checks     .  Bedside Swallow Eval     .  DVT Prophylaxis     .  IV Fluids, Normal Saline     .  Head of Bed 30 Degrees     .  Euglycemia and Avoid Hyperthermia (PRN Acetaminophen)   Sign Out:     .  Discussed with Emergency Department Provider   ------------------------------------------------------------------------------  History of Present Illness: Patient is a 50 year old Female.  Patient  was brought by EMS for symptoms of generalized wkness, slurred speech.  34 F, h/o HTN, HL, DM, stroke/ICH with residual L sided wkness, who was at a rehab today due to recent covid+ and acute stroke on May 2, 22. She was reportedly LKW at 8:15 PM when she last took her meds. She later informed staff that she was not feeling well and complained of feeling weak all over. She also had slurred speech. Brought to ER for eval. NIHSS 13 on my assessment for L>R sided wkness, L facial droop, disorientation and severe dysarthria.   Anticoagulant use:  No  Antiplatelet use: Yes asa  Allergies:  Reviewed    Examination: BP(139/78), Blood Glucose(357) 1A: Level of Consciousness - Alert; keenly responsive + 0 1B: Ask Month and Age - 1 Question Right + 1 1C: Blink Eyes & Squeeze Hands - Performs Both Tasks + 0 2: Test Horizontal Extraocular Movements - Normal + 0 3: Test Visual Fields - No Visual Loss + 0 4: Test Facial Palsy (Use Grimace if Obtunded) - Partial paralysis (lower face) + 2 5A: Test Left Arm Motor Drift - Some Effort Against Gravity + 2 5B: Test Right Arm Motor Drift - Drift, but doesn't hit bed + 1 6A: Test Left Leg Motor Drift - Some Effort Against Gravity + 2 6B: Test Right Leg Motor Drift - Some Effort Against Gravity + 2 7: Test Limb  Ataxia (FNF/Heel-Shin) - No Ataxia + 0 8: Test Sensation - Mild-Moderate Loss: Less Sharp/More Dull + 1 9: Test Language/Aphasia - Normal; No aphasia + 0 10: Test Dysarthria - Severe Dysarthria: Unintelligble Slurring or Out of Proportion to Aphasia + 2 11: Test Extinction/Inattention - No abnormality + 0  NIHSS Score: 13   Pre-Morbid Modified Rankin Scale: Unable to assess   Patient/Family was informed the Neurology Consult would occur via TeleHealth consult by way of interactive audio and video telecommunications and consented to receiving care in this manner.   Patient is being evaluated for possible acute neurologic impairment and high  probability of imminent or life-threatening deterioration. I spent total of 20 minutes providing care to this patient, including time for face to face visit via telemedicine, review of medical records, imaging studies and discussion of findings with providers, the patient and/or family.   Dr Burtis Junes   [facility] [phone]  Case [id number]

## 2021-01-17 NOTE — H&P (Signed)
History and Physical   Diamond Rice DXI:[id number] DOB: September 15, 1970 DOA: 01/16/2021  PCP: System, Provider Not In   Patient coming from: [facility]  Chief Complaint: Code stroke, facial droop, slurred speech, numbness, aphasia  HPI: Diamond Rice is a 50 y.o. female with medical history significant of recurrent CVA most recently last month, anemia, substance use, COPD, CKD 3A, obesity, DVT, PE, diabetes, hypertension, PVD, OSA, depression who presents with new focal neurologic deficits.  Patient is presenting from [facility] where she was being cared for after discharge for recurrent stroke at the end of April beginning of this month.  She has residual left-sided deficits from that stroke.  She was last seen normal around 8:15 PM today.  She was diagnosed with COVID 14 days ago and is been in isolation unit.  This evening she was noted to have left facial droop, right-sided numbness, slurred speech, expressive aphasia.  Patient's aphasia is improving and she is able to answer some questions.  She reports concerns about her swallowing and having had issues with swallowing prior to this episode.  She denies fevers, chills, chest pain, shortness of breath, abdominal pain, constipation, diarrhea, nausea, vomiting.  ED Course: Vital signs in the ED significant for blood pressure in the 767H 419F systolic.  Lab work-up shows CMP with creatinine stable at 1.17, glucose 366, albumin 3.3.  CBC with hemoglobin stable at 10.5 and elevated white count at 13.4.  PT, PTT, INR within normal limits.  Respiratory panel for flu and COVID pending but expected to be positive.  Ethanol level negative.  UDS and urinalysis pending.  CT head without acute abnormality but shows multiple old infarcts.  CT angio head and neck without acute abnormality.  Neurology was consulted in the ED and recommended against tPA due to recent stroke and history of hemorrhagic stroke as well.  Patient will be worked up  for recurrent stroke.  Review of Systems: As per HPI otherwise all other systems reviewed and are negative.  Past Medical History:  Diagnosis Date  . Acute ischemic stroke ([place]) 12/27/2020  . Acute renal failure (ARF) ([place]) 04/02/2018  . Anxiety   . Chronic lower back pain 04/17/2012   "just got over some; catched when I walked"  . COPD (chronic obstructive pulmonary disease) ([place])   . Critical lower limb ischemia ([place]) 05/27/2018  . Depression   . Headache(784.0) 04/17/2012   "~ qod; lately waking up in am w/one"  . High cholesterol   . Hypertension   . Migraines 04/17/2012  . Obesity   . Sleep apnea    CPAP  . Stroke ([place])   . Type II diabetes mellitus ([place]) 08/28/2002    Past Surgical History:  Procedure Laterality Date  . ABDOMINAL AORTOGRAM W/LOWER EXTREMITY Left 05/27/2018   Procedure: ABDOMINAL AORTOGRAM W/LOWER EXTREMITY Run[place] and Possible Intervention;  Surgeon: Waynetta Sandy, MD;  Location: [place] [facility];  Service: Cardiovascular;  Laterality: Left;  . APPLICATION OF WOUND VAC Left 06/06/2018   Procedure: APPLICATION OF WOUND VAC;  Surgeon: Waynetta Sandy, MD;  Location: [place];  Service: Vascular;  Laterality: Left;  . BALLOON DILATION N/A 12/31/2020   Procedure: BALLOON DILATION;  Surgeon: Ronnette Juniper, MD;  Location: [place];  Service: Gastroenterology;  Laterality: N/A;  . BIOPSY  04/04/2018   Procedure: BIOPSY;  Surgeon: [name], MD;  Location: [facility];  Service: Endoscopy;;  . BIOPSY  10/27/2020   Procedure: BIOPSY;  Surgeon: Arta Silence, MD;  Location:  [facility];  Service: Endoscopy;;  . BOTOX INJECTION N/A 12/31/2020   Procedure: BOTOX INJECTION;  Surgeon: Ronnette Juniper, MD;  Location: [place];  Service: Gastroenterology;  Laterality: N/A;  . CARDIAC CATHETERIZATION  ~ 2011  . COLONOSCOPY N/A 04/04/2018   Procedure: COLONOSCOPY;  Surgeon: [name], MD;  Location: [facility] ENDOSCOPY;  Service: Endoscopy;  Laterality: N/A;  .  COLONOSCOPY WITH PROPOFOL N/A 10/27/2020   Procedure: COLONOSCOPY WITH PROPOFOL;  Surgeon: Arta Silence, MD;  Location: [facility];  Service: Endoscopy;  Laterality: N/A;  . ESOPHAGOGASTRODUODENOSCOPY N/A 12/31/2020   Procedure: ESOPHAGOGASTRODUODENOSCOPY (EGD);  Surgeon: Ronnette Juniper, MD;  Location: [place];  Service: Gastroenterology;  Laterality: N/A;  . ESOPHAGOGASTRODUODENOSCOPY (EGD) WITH PROPOFOL N/A 10/27/2020   Procedure: ESOPHAGOGASTRODUODENOSCOPY (EGD) WITH PROPOFOL  WITH POSSIBLE DIL;  Surgeon: Arta Silence, MD;  Location: [facility];  Service: Endoscopy;  Laterality: N/A;  . FOREIGN BODY REMOVAL  12/31/2020   Procedure: FOREIGN BODY REMOVAL;  Surgeon: Ronnette Juniper, MD;  Location: [place];  Service: Gastroenterology;;  . LOWER EXTREMITY ANGIOGRAM Left 05/27/2018  . PERIPHERAL VASCULAR INTERVENTION Left 05/27/2018   Procedure: PERIPHERAL VASCULAR INTERVENTION;  Surgeon: Waynetta Sandy, MD;  Location: [place] [facility];  Service: Cardiovascular;  Laterality: Left;  . POLYPECTOMY  04/04/2018   Procedure: POLYPECTOMY;  Surgeon: [name], MD;  Location: [name]COPY;  Service: Endoscopy;;  . POLYPECTOMY  10/27/2020   Procedure: POLYPECTOMY;  Surgeon: Arta Silence, MD;  Location: [facility];  Service: Endoscopy;;  . TRANSMETATARSAL AMPUTATION Left 05/28/2018   Procedure: AMPUTATION TOES THREE, FOUR AND FIVE on left foot;  Surgeon: Waynetta Sandy, MD;  Location: [place];  Service: Vascular;  Laterality: Left;  . WOUND DEBRIDEMENT Left 06/06/2018   Procedure: DEBRIDEMENT WOUND LEFT FOOT;  Surgeon: Waynetta Sandy, MD;  Location: [place];  Service: Vascular;  Laterality: Left;    Social History  reports that she has quit smoking. Her smoking use included cigarettes. She has a 7.00 pack-year smoking history. She has never used smokeless tobacco. She reports current drug use. Drugs: Marijuana and "Crack" cocaine. She reports that she does not drink  alcohol.  Allergies  Allergen Reactions  . Morphine And Related Hives  . Metformin Diarrhea and Other (See Comments)    "Allergic," per [facility]    Family History  Problem Relation Age of Onset  . Stroke Brother 71  . Stroke Brother 64  . Heart attack Mother 44  . Stroke Father 71  Reviewed on admission  Prior to Admission medications   Medication Sig Start Date End Date Taking? Authorizing Provider  ACCU-CHEK GUIDE test strip 2 (two) times daily. as directed 02/06/20  Yes [provider]  Accu-Chek Softclix Lancets lancets 2 (two) times daily. 02/06/20  Yes [provider]  amLODipine (NORVASC) 10 MG tablet Take 1 tablet (10 mg total) by mouth daily. 01/22/20  Yes Wert, Margreta Journey, NP  ARIPiprazole (ABILIFY) 5 MG tablet Take 5 mg by mouth daily. 08/28/20  Yes [provider]  aspirin EC 81 MG EC tablet Take 1 tablet (81 mg total) by mouth daily. 09/21/18  Yes Donzetta Starch, NP  atorvastatin (LIPITOR) 40 MG tablet Take 2 tablets (80 mg total) by mouth daily at 6 PM. 01/03/21  Yes Hongalgi, Lenis Dickinson, MD  Blood Glucose Monitoring Suppl (ACCU-CHEK GUIDE) w/Device KIT USE AS DIRECTED TO CHECK BLOOD SUGAR 02/06/20  Yes [provider]  cloNIDine (CATAPRES) 0.2 MG tablet Take 0.2 mg by mouth 2 (two) times daily.  For 7 days 01/11/21 01/18/21 Yes [provider]  dexamethasone (DECADRON) 6 MG tablet Take 6 mg by mouth daily. For COVID for 10 days   Yes [provider]  hydrALAZINE (APRESOLINE) 100 MG tablet Take 1 tablet (100 mg total) by mouth 3 (three) times daily. 01/22/20  Yes Wert, Margreta Journey, NP  insulin aspart (NOVOLOG) 100 UNIT/ML injection Inject 0-9 Units into the skin 3 (three) times daily with meals. Correction coverage: Sensitive (thin, NPO, renal) CBG < 70: Implement Hypoglycemia protocol. CBG 70 - 120: 0 units CBG 121 - 150: 1 unit CBG 151 - 200: 2 units CBG 201 - 250: 3 units CBG 251 - 300: 5 units CBG 301 - 350: 7 units CBG 351 - 400: 9  units CBG > 400: call MD. 01/03/21  Yes Hongalgi, Lenis Dickinson, MD  lisinopril (ZESTRIL) 40 MG tablet Take 1 tablet (40 mg total) by mouth daily. 01/22/20  Yes Royal Hawthorn, NP  metoprolol succinate (TOPROL-XL) 50 MG 24 hr tablet Take 50 mg by mouth every morning. 08/02/20  Yes [provider]  mirtazapine (REMERON) 15 MG tablet Take 1 tablet (15 mg total) by mouth at bedtime. For Depression Patient taking differently: Take 15 mg by mouth at bedtime. For Depression , appetite 01/22/20  Yes Wert, Margreta Journey, NP  pantoprazole (PROTONIX) 40 MG tablet Take 1 tablet (40 mg total) by mouth daily. 01/03/21  Yes Hongalgi, Lenis Dickinson, MD  amoxicillin-clavulanate (AUGMENTIN) 875-125 MG tablet Take 1 tablet by mouth every 12 (twelve) hours. Discontinue after Jan 04, 2021 doses. Patient not taking: No sig reported 01/03/21   Modena Jansky, MD  PEG 3350-KCl-NaBcb-NaCl-NaSulf (PEG-3350/ELECTROLYTES) 236 g SOLR SMARTSIG:Milliliter(s) By Mouth 01/13/21   [provider]    Physical Exam: Vitals:   01/16/21 2327 01/17/21 0004  BP: (!) 154/92 (!) 146/95  Pulse: 72 73  Resp: 18 16  Temp: 98.8 F (37.1 C)   TempSrc: Oral   SpO2: 99% 99%   Physical Exam Constitutional:      General: She is not in acute distress.    Appearance: Normal appearance.  HENT:     Head: Normocephalic and atraumatic.     Mouth/Throat:     Mouth: Mucous membranes are moist.     Pharynx: Oropharynx is clear.  Eyes:     Extraocular Movements: Extraocular movements intact.     Pupils: Pupils are equal, round, and reactive to light.  Cardiovascular:     Rate and Rhythm: Normal rate and regular rhythm.     Pulses: Normal pulses.     Heart sounds: Normal heart sounds.  Pulmonary:     Effort: Pulmonary effort is normal. No respiratory distress.     Breath sounds: Normal breath sounds.  Abdominal:     General: Bowel sounds are normal. There is no distension.     Palpations: Abdomen is soft.     Tenderness: There is no  abdominal tenderness.  Musculoskeletal:        General: No swelling or deformity.  Skin:    General: Skin is warm and dry.  Neurological:     Comments: Mental Status: Patient is awake, alert, oriented Some expressive aphasia, has improved, still hoarse with slow speech Cranial Nerves: II: Pupils equal, round, and reactive to light.  III,IV, VI: EOMI without ptosis or diploplia.  V: Facial sensation is symmetric tolight touch VII: Some residual left facial droop VIII: hearing is intact to voice X: Uvula elevates symmetrically XI: Shoulder shrug is symmetric, but somewhat  weak. XII: Patient having difficulty with full extension of tongue.  Motor: Good effort thorughout.  -Chronically decreased LUE at 2-3/5 and LLE at 2-3/5 -RUE 3-4/5 and RLE 3-4/5 Sensory: -Intact left upper and lower extremity -Intact right upper and lower extremity    Labs on Admission: I have personally reviewed following labs and imaging studies  CBC: Recent Labs  Lab 01/11/21 1709 01/16/21 2316  WBC 10.5 13.4*  NEUTROABS 5.1 9.2*  HGB 10.6* 10.5*  HCT 33.7* 32.6*  MCV 72.3* 71.5*  PLT 362 299    Basic Metabolic Panel: Recent Labs  Lab 01/11/21 1709 01/16/21 2316  NA 134* 135  K 3.8 4.3  CL 100 100  CO2 27 24  GLUCOSE 200* 366*  BUN 24* 33*  CREATININE 1.11* 1.17*  CALCIUM 9.4 9.3    GFR: Estimated Creatinine Clearance: 58.4 mL/min (A) (by C-G formula based on SCr of 1.17 mg/dL (H)).  Liver Function Tests: Recent Labs  Lab 01/11/21 1709 01/16/21 2316  AST 18 22  ALT 16 23  ALKPHOS 66 56  BILITOT 0.6 0.6  PROT 7.9 7.5  ALBUMIN 3.5 3.3*    Urine analysis:    Component Value Date/Time   COLORURINE YELLOW (A) 01/16/2021 2316   APPEARANCEUR CLEAR (A) 01/16/2021 2316   LABSPEC 1.022 01/16/2021 2316   PHURINE 6.0 01/16/2021 2316   GLUCOSEU >=500 (A) 01/16/2021 2316   HGBUR NEGATIVE 01/16/2021 2316   BILIRUBINUR NEGATIVE 01/16/2021 2316   KETONESUR NEGATIVE 01/16/2021  2316   PROTEINUR NEGATIVE 01/16/2021 2316   UROBILINOGEN 0.2 08/28/2014 1143   NITRITE POSITIVE (A) 01/16/2021 2316   LEUKOCYTESUR TRACE (A) 01/16/2021 2316    Radiological Exams on Admission: CT HEAD CODE STROKE WO CONTRAST  Result Date: 01/16/2021 CLINICAL DATA:  Code stroke. Left facial droop and expressive aphasia EXAM: CT HEAD WITHOUT CONTRAST TECHNIQUE: Contiguous axial images were obtained from the base of the skull through the vertex without intravenous contrast. COMPARISON:  None. FINDINGS: Brain: There is no mass, hemorrhage or extra-axial collection. The size and configuration of the ventricles and extra-axial CSF spaces are normal. There is hypoattenuation of the periventricular white matter, most commonly indicating chronic ischemic microangiopathy. 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 Vascular: No abnormal hyperdensity of the major intracranial arteries or dural venous sinuses. No intracranial atherosclerosis. Skull: The visualized skull base, calvarium and extracranial soft tissues are normal. Sinuses/Orbits: No fluid levels or advanced mucosal thickening of the visualized paranasal sinuses. No mastoid or middle ear effusion. The orbits are normal. ASPECTS [facility] Stroke Program Early CT Score) - Ganglionic level infarction (caudate, lentiform nuclei, internal capsule, insula, M1-M3 cortex): 7 - Supraganglionic infarction (M4-M6 cortex): 3 Total score (0-10 with 10 being normal): 10 IMPRESSION: 1. No acute intracranial abnormality. 2. ASPECTS is 10. 3. 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 Advanced chronic ischemic changes for age. These results were called by telephone at the time of interpretation on 01/16/2021 at 11:28 pm to provider [facility] , who verbally acknowledged these results. Electronically Signed   By: Ulyses Jarred M.D.   On: 01/16/2021 23:28   CT ANGIO HEAD NECK W WO CM W PERF (CODE  STROKE)  Result Date: 01/17/2021 CLINICAL DATA:  Right-sided numbness and slurred speech. Left facial droop. EXAM: CT ANGIOGRAPHY HEAD AND NECK TECHNIQUE: Multidetector CT imaging of the head and neck was performed using the standard protocol during bolus administration of intravenous contrast. Multiplanar CT image reconstructions  and MIPs were obtained to evaluate the vascular anatomy. Carotid stenosis measurements (when applicable) are obtained utilizing NASCET criteria, using the distal internal carotid diameter as the denominator. CONTRAST:  172mL OMNIPAQUE IOHEXOL 350 MG/ML SOLN COMPARISON:  None. FINDINGS: CTA NECK FINDINGS SKELETON: There is no bony spinal canal stenosis. No lytic or blastic lesion. OTHER NECK: Normal pharynx, larynx and major salivary glands. No cervical lymphadenopathy. Unremarkable thyroid gland. UPPER CHEST: No pneumothorax or pleural effusion. No nodules or masses. AORTIC ARCH: There is calcific atherosclerosis of the aortic arch. There is no aneurysm, dissection or hemodynamically significant stenosis of the visualized portion of the aorta. Conventional 3 vessel aortic branching pattern. The visualized proximal subclavian arteries are widely patent. RIGHT CAROTID SYSTEM: Normal without aneurysm, dissection or stenosis. LEFT CAROTID SYSTEM: Normal without aneurysm, dissection or stenosis. VERTEBRAL ARTERIES: Left dominant configuration. Both origins are clearly patent. There is no dissection, occlusion or flow-limiting stenosis to the skull base (V1-V3 segments). CTA HEAD FINDINGS POSTERIOR CIRCULATION: --Vertebral arteries: Normal V4 segments. --Inferior cerebellar arteries: Normal. --Basilar artery: Normal. --Superior cerebellar arteries: Normal. --Posterior cerebral arteries (PCA): Normal. ANTERIOR CIRCULATION: --Intracranial internal carotid arteries: Normal. --Anterior cerebral arteries (ACA): Normal. Both A1 segments are present. Patent anterior communicating artery (a-comm).  --Middle cerebral arteries (MCA): Normal. VENOUS SINUSES: As permitted by contrast timing, patent. ANATOMIC VARIANTS: Fetal origins of both posterior cerebral arteries. Review of the MIP images confirms the above findings. IMPRESSION: 1. No emergent large vessel occlusion or high-grade stenosis of the intracranial arteries. Aortic Atherosclerosis (ICD10-I70.0). Electronically Signed   By: Ulyses Jarred M.D.   On: 01/17/2021 00:09   EKG: Independently reviewed.  Sinus rhythm at 74 bpm.  T wave inversion in V5 and V6.  Similar to previous with changes in V5 being new however this could be due to lead placement considering changes in V6 are similar to previous.  Assessment/Plan Principal Problem:   Acute CVA (cerebrovascular accident) ([place]) Active Problems:   Hypertension   DM (diabetes mellitus) with peripheral vascular complication (HCC)   Hyperlipidemia   OSA (obstructive sleep apnea)   Schizoaffective disorder, bipolar type (HCC)   Polysubstance abuse (HCC)   Peripheral vascular disease (HCC)   Anemia of chronic disease   CKD (chronic kidney disease) stage 3, GFR 30-59 ml/min (HCC)   History of CVA with residual deficit   Leukocytosis  Acute CVA > Acute CVA with symptoms of left cervical droop, right-sided numbness, slurred speech, expressive aphasia. > In setting of multiple recurrent strokes believed to be due to poor risk factor control.  Most recent stroke was around 20 days ago. > CT head and CT angio head and neck were without acute abnormality. > Has been seen by neurology who recommend against tPA given recency of stroke and history of hemorrhagic stroke. - Likely will need formal neurology consultation in the morning, as evaluation was done via tele-neurologist - Allow for permissive HTN (systolic < 324 and diastolic < 401) - Continue ASA and statin when tolerating p.o.   - Hold off on repeat echocardiogram as has had 1 recently - A1C  - Lipid panel  - Tele monitoring  - SLP  eval - PT/OT  Hypertension - Holding home amlodipine, clonidine, hydralazine, lisinopril, metoprolol in the setting of permissive hypertension and current n.p.o. status  Recent COVID infection > Patient tested positive for COVID-19 14 days ago per reports - Outside the window for precautions at this time.   Leukocytosis > White count elevated to 13.4 in the ED. >  Could be secondary to stroke, will check urinalysis and chest x-ray to rule out obvious infection, did recently have COVID diagnosed 14 days ago. - Check urinalysis and chest x-ray - Trend CBC  Anemia > Hemoglobin stable at 10.5 - Trend CBC  CKD 3A > Creatinine stable at 1.17 - Avoid nephrotoxic agents - Trend renal function and electrolytes  Diabetes - SSI  PVD Hyperlipidemia - Continue home statin  OSA - Continue CPAP  Depression Schizoaffective disorder - Continue home Abilify when tolerating p.o.  DVT prophylaxis: Lovenox  Code Status:   Full  Family Communication:  Brother, Skipper Cliche, updated by phone Disposition Plan:   Patient is from:  [facility]  Anticipated DC to:  SNF  Anticipated DC date:  1 to 3 days  Anticipated DC barriers: None  Consults called:  Neurology, telemetry neurologist, consulted by EDP.  Will likely still need formal neurology consult in the morning.   Admission status:  Observation, telemetry   Severity of Illness: The appropriate patient status for this patient is OBSERVATION. Observation status is judged to be reasonable and necessary in order to provide the required intensity of service to ensure the patient's safety. The patient's presenting symptoms, physical exam findings, and initial radiographic and laboratory data in the context of their medical condition is felt to place them at decreased risk for further clinical deterioration. Furthermore, it is anticipated that the patient will be medically stable for discharge from the hospital within 2 midnights of  admission. The following factors support the patient status of observation.   " The patient's presenting symptoms include left facial droop, right-sided numbness, slurred speech, expressive aphasia. " The physical exam findings include right-sided numbness, left facial droop, hoarse speech. " The initial radiographic and laboratory data are creatinine 1.17, BUN 33, glucose 366, albumin 3.3, hemoglobin 10.5, leukocytosis to 13.4.   Marcelyn Bruins MD [facility]  How to contact the [facility] Attending or Consulting provider [place] or covering provider during after hours [place], for this patient?   1. Check the care team in [place] and look for a) attending/consulting [facility] provider listed and b) the [facility] team listed 2. Log into [email or web address] and use [place]'s universal password to access. If you do not have the password, please contact the hospital operator. 3. Locate the [facility] provider you are looking for under [facility] and page to a number that you can be directly reached. 4. If you still have difficulty reaching the provider, please page the [facility] (Director on Call) for the Hospitalists listed on amion for assistance.  01/17/2021, 1:01 AM

## 2021-01-17 NOTE — ED Notes (Signed)
MRI called. Coming to pt bedside for screening.

## 2021-01-17 NOTE — Progress Notes (Signed)
   01/16/21 2315  Clinical Encounter Type  Visited With Patient  Visit Type Initial;Spiritual support;Social support  Referral From Nurse  Consult/Referral To [place] responded to a Code Stroke page. When the Chaplain arrived, medical staff were actively [place] the PT. The Chaplain ministered with presence at the Pt's door and checking on 2 of the nurses. Chaplain told ED secretary to page Chaplain if further assistance was needed

## 2021-01-17 NOTE — Evaluation (Addendum)
Clinical/Bedside Swallow Evaluation Patient Details  Name: Diamond Rice MRN: [id number] Date of Birth: [birth year]  Today's Date: 01/17/2021 Time: SLP Start Time (ACUTE ONLY): 1215 SLP Stop Time (ACUTE ONLY): 1315 SLP Time Calculation (min) (ACUTE ONLY): 60 min  Past Medical History:  Past Medical History:  Diagnosis Date  . Acute ischemic stroke ([place]) 12/27/2020  . Acute renal failure (ARF) ([place]) 04/02/2018  . Anxiety   . Chronic lower back pain 04/17/2012   "just got over some; catched when I walked"  . COPD (chronic obstructive pulmonary disease) ([place])   . Critical lower limb ischemia ([place]) 05/27/2018  . Depression   . Headache(784.0) 04/17/2012   "~ qod; lately waking up in am w/one"  . High cholesterol   . Hypertension   . Migraines 04/17/2012  . Obesity   . Sleep apnea    CPAP  . Stroke ([place])   . Type II diabetes mellitus ([place]) 08/28/2002   Past Surgical History:  Past Surgical History:  Procedure Laterality Date  . ABDOMINAL AORTOGRAM W/LOWER EXTREMITY Left 05/27/2018   Procedure: ABDOMINAL AORTOGRAM W/LOWER EXTREMITY Run[place] and Possible Intervention;  Surgeon: Waynetta Sandy, MD;  Location: [place] [facility];  Service: Cardiovascular;  Laterality: Left;  . APPLICATION OF WOUND VAC Left 06/06/2018   Procedure: APPLICATION OF WOUND VAC;  Surgeon: Waynetta Sandy, MD;  Location: [place];  Service: Vascular;  Laterality: Left;  . BALLOON DILATION N/A 12/31/2020   Procedure: BALLOON DILATION;  Surgeon: Ronnette Juniper, MD;  Location: [place];  Service: Gastroenterology;  Laterality: N/A;  . BIOPSY  04/04/2018   Procedure: BIOPSY;  Surgeon: [name], MD;  Location: [facility] ENDOSCOPY;  Service: Endoscopy;;  . BIOPSY  10/27/2020   Procedure: BIOPSY;  Surgeon: Arta Silence, MD;  Location: [facility] ENDOSCOPY;  Service: Endoscopy;;  . BOTOX INJECTION N/A 12/31/2020   Procedure: BOTOX INJECTION;  Surgeon: Ronnette Juniper, MD;  Location: [place];  Service: Gastroenterology;   Laterality: N/A;  . CARDIAC CATHETERIZATION  ~ 2011  . COLONOSCOPY N/A 04/04/2018   Procedure: COLONOSCOPY;  Surgeon: [name], MD;  Location: [facility] ENDOSCOPY;  Service: Endoscopy;  Laterality: N/A;  . COLONOSCOPY WITH PROPOFOL N/A 10/27/2020   Procedure: COLONOSCOPY WITH PROPOFOL;  Surgeon: Arta Silence, MD;  Location: [facility] ENDOS[facility];  Service: Endoscopy;  Laterality: N/A;  . ESOPHAGOGASTRODUODENOSCOPY N/A 12/31/2020   Procedure: ESOPHAGOGASTRODUODENOSCOPY (EGD);  Surgeon: Ronnette Juniper, MD;  Location: [place];  Service: Gastroenterology;  Laterality: N/A;  . ESOPHAGOGASTRODUODENOSCOPY (EGD) WITH PROPOFOL N/A 10/27/2020   Procedure: ESOPHAGOGASTRODUODENOSCOPY (EGD) WITH PROPOFOL  WITH POSSIBLE DIL;  Surgeon: Arta Silence, MD;  Location: [facility]OS[facility];  Service: Endoscopy;  Laterality: N/A;  . FOREIGN BODY REMOVAL  12/31/2020   Procedure: FOREIGN BODY REMOVAL;  Surgeon: Ronnette Juniper, MD;  Location: [place];  Service: Gastroenterology;;  . LOWER EXTREMITY ANGIOGRAM Left 05/27/2018  . PERIPHERAL VASCULAR INTERVENTION Left 05/27/2018   Procedure: PERIPHERAL VASCULAR INTERVENTION;  Surgeon: Waynetta Sandy, MD;  Location: [place] CV [facility];  Service: Cardiovascular;  Laterality: Left;  . POLYPECTOMY  04/04/2018   Procedure: POLYPECTOMY;  Surgeon: [name], MD;  Location: [facility] ENDOSCOPY;  Service: Endoscopy;;  . POLYPECTOMY  10/27/2020   Procedure: POLYPECTOMY;  Surgeon: Arta Silence, MD;  Location: [facility] ENDOSCOPY;  Service: Endoscopy;;  . TRANSMETATARSAL AMPUTATION Left 05/28/2018   Procedure: AMPUTATION TOES THREE, FOUR AND FIVE on left foot;  Surgeon: Waynetta Sandy, MD;  Location: [place];  Service: Vascular;  Laterality: Left;  . WOUND DEBRIDEMENT Left 06/06/2018  Procedure: DEBRIDEMENT WOUND LEFT FOOT;  Surgeon: Waynetta Sandy, MD;  Location: [facility] OR;  Service: Vascular;  Laterality: Left;   HPI:  Pt is a 50 yo female with a h/o 4 prior strokes w/ Residual Left  sided weakness and Dysarthria and extensive PMH including: DM2, HTN, HLD, smoking, prior cocaine, substance abuse, PE, depression, medication nonadhering. Patient is presenting from [facility] where she was being cared for after discharge for recurrent stroke at the end of April 2022. She was previously at [facility] for txs. She has residual Left-sided deficits from that stroke as well as from previous stroke in 2021 (per pt report).  She was last seen normal around 8:15 PM today. She was diagnosed with COVID 14 days ago and is been in isolation unit.  This evening she was noted to have left facial droop, right-sided numbness, slurred speech, expressive aphasia which has improved.  Pt has had previous MBSSs (dysphagia in 11/2019) but cleared for a Regular diet w/ thin liquids at CIR in 2022.  MRI this admit: Fading diffusion restriction in the left corona radiata  consistent with late subacute to chronic infarct. No new site of  infarction or hemorrhage.  2. Multiple chronic microhemorrhages in a predominantly central  distribution, consistent with cerebral amyloid angiopathy. CXR: No radiographic evidence of acute cardiopulmonary abnormality.   Assessment / Plan / Recommendation Clinical Impression  Pt appears to present w/ adequate pharyngeal phase swallow function w/ mild oral phase deficits c/b min slower oral movements and mastication of increased textured trials -- given time, oral phase managemen was appropriate and pt ensured full oral clearing w/ lingual sweeping on Left side. Neuromuscular deficits noted (Chronic and Baseline) c/b Left labial weakness and min slower lingual coordination. Pt consumed po trials w/ No overt, clinical s/s of aspiration during po trials. Pt appears at reduced risk for aspiration following general aspiration precautions and w/ a slightly modified diet of chopping/mincing meats of a Mech Soft diet consistency for ease of oral phase management.  During po trials, pt  consumed all consistencies w/ no overt coughing, decline in vocal quality, or change in respiratory presentation during/post trials. Oral phase appeared [facility] w/ timely bolus management and control of bolus propulsion for A-P transfer for swallowing w/ liquids and purees; min slower mastication and bolus management of increased textures as stated above. Oral clearing achieved w/ all trial consistencies given min Time w/ solids. OM Exam revealed Left labial decreased tone, weakness w/ slight-min Left lateral lingual weakness. Speech c/b mild Dysarthria but fully intelligible. Pt recently receved Dysathria eval/tx at [facility] in 12/2020 then dx'd to SNF and followed there. Pt fed self w/ setup support - LUE weakness.    For the most beneficial diet consistent for pt d/t Baseline, chronic issues, recommend a Mech Soft consistency diet w/ well-Chopped or Minced meats, moistened foods w/ gravies; Thin liquids VIA CUP - do NOT recommend use of straws for best control. Recommend general aspiration precautions, Pills WHOLE in Puree for safer, easier swallowing as pt described Larger pills causing difficulty to swallow -- she did well w/ this when practiced during session w/ NSG. Education given on Pills in Puree; food consistencies and easy to eat options; general aspiration precautions. NSG to reconsult if any new needs arise. NSG agreed.  Of note, per recent d/c from CIR, pt was seen for Cognitive-linguistic evaluation w/ results as follows: "Pt has a Mild Dysarthria that is improved compared to initial evaluation by SLP after her last stroke.  With extra time, pt is mostly intelligible at the conversational level, but still impacted by decreased breath support and reduced articulation. Pt apears to have appropriate comprehension and sustained attention with only mild difficulties noted with short-term, delayed recall. Question impaired emergent awareness.".  Recommend continued f/u w/ ST services at [facility]  for Dysarthria to enhance communication w/ ADLs, education on strategies. Pt stated she had only had 2 visits from the Speech Therapist at [facility]. Updated the SW. SLP Visit Diagnosis: Dysphagia, oral phase (R13.11) (mild)    Aspiration Risk  Mild aspiration risk;Risk for inadequate nutrition/hydration (reduced following precautions)    Diet Recommendation  Mech Soft consistency diet w/ well-Chopped or Minced meats, moistened foods w/ gravies; Thin liquids VIA CUP - do NOT recommend use of straws for best control. Recommend general aspiration precautions including lingual sweeping to ensure oral clearing b/t bites, alternate w/ small sip of water, small bites/sips slowly. Reflux precautions.  NO STRAWS!  Medication Administration: Whole meds with puree (for safer swallowing)    Other  Recommendations Recommended Consults:  (Dietician) Oral Care Recommendations: Oral care BID;Oral care before and after PO;Staff/trained caregiver to provide oral care Other Recommendations:  (n/a)   Follow up Recommendations Skilled Nursing facility (return to)      Frequency and Duration  (n/a)   (n/a)       Prognosis Prognosis for Safe Diet Advancement: Fair (-Good) Barriers to Reach Goals: Time post onset;Severity of deficits (CVAs x4)      Swallow Study   General Date of Onset: 01/16/21 HPI: Pt is a 50 yo female with a h/o 4 prior strokes w/ Residual Left sided weakness and Dysarthria and extensive PMH including: DM2, HTN, HLD, smoking, prior cocaine, substance abuse, PE, depression, medication nonadhering. Patient is presenting from [facility] where she was being cared for after discharge for recurrent stroke at the end of April 2022. She was previously at [facility] for txs. She has residual Left-sided deficits from that stroke as well as from previous stroke in 2021 (per pt report).  She was last seen normal around 8:15 PM today. She was diagnosed with COVID 14 days ago and is been in isolation  unit.  This evening she was noted to have left facial droop, right-sided numbness, slurred speech, expressive aphasia which has improved.  Pt has had previous MBSSs (dysphagia in 11/2019) but cleared for a Regular diet w/ thin liquids at CIR in 2022.  MRI this admit: Fading diffusion restriction in the left corona radiata  consistent with late subacute to chronic infarct. No new site of  infarction or hemorrhage.  2. Multiple chronic microhemorrhages in a predominantly central  distribution, consistent with cerebral amyloid angiopathy. CXR: No radiographic evidence of acute cardiopulmonary abnormality. Type of Study: Bedside Swallow Evaluation Previous Swallow Assessment: MBSS and txs - 12/2020; BSE and txs - 11/2019; 2020 Diet Prior to this Study: Regular;Thin liquids (per MBSS in 12/2020 at CIR) Temperature Spikes Noted: No (wbc elevated at admit) Respiratory Status: Room air History of Recent Intubation: No Behavior/Cognition: Alert;Cooperative;Pleasant mood (mild dysarthria baseline) Oral Cavity Assessment: Dry Oral Care Completed by SLP: Yes Oral Cavity - Dentition: Adequate natural dentition Vision: Functional for self-feeding Self-Feeding Abilities: Able to feed self;Needs assist;Needs set up (LUE weakness chronic) Patient Positioning: Upright in chair Baseline Vocal Quality: Normal;Low vocal intensity (min) Volitional Cough:  (Fair+) Volitional Swallow: Able to elicit    Oral/Motor/Sensory Function Overall Oral Motor/Sensory Function: Mild impairment Facial ROM: Reduced left;Suspected CN VII (facial)  dysfunction Facial Symmetry: Abnormal symmetry left;Suspected CN VII (facial) dysfunction Facial Strength: Reduced left;Suspected CN VII (facial) dysfunction Lingual ROM: Within Functional Limits Lingual Symmetry: Within Functional Limits Lingual Strength: Reduced;Suspected CN XII (hypoglossal) dysfunction (slight-min) Mandible: Within Functional Limits   Ice Chips Ice chips: Within  functional limits Presentation: Spoon (fed; 6 trials)   Thin Liquid Thin Liquid: Within functional limits Presentation: Cup;Self Fed (15+ trials) Pharyngeal  Phase Impairments:  (none) Other Comments: single sips; small    Nectar Thick Nectar Thick Liquid: Not tested   Honey Thick Honey Thick Liquid: Not tested   Puree Puree: Within functional limits Presentation: Spoon;Self Fed (supported; ~3 ozs) Pharyngeal Phase Impairments:  (none) Other Comments: small bites for best control   Solid     Solid: Impaired (min) Presentation: Spoon;Self Fed (6 trials) Oral Phase Impairments: Reduced lingual movement/coordination;Impaired mastication (min) Oral Phase Functional Implications: Impaired mastication (min prolonged) Pharyngeal Phase Impairments:  (none) Other Comments: lingual sweeping to fully clear        Orinda Kenner, MS, CCC-SLP Speech Language Pathologist Rehab Services [phone] Audreyanna Butkiewicz 01/17/2021,5:19 PM

## 2021-01-17 NOTE — ED Notes (Signed)
Patient concerned that she may have choked on food. Hx of recent foreign body in throat that was removed by scope. MD aware. Plan to keep patient NPO.

## 2021-01-18 DIAGNOSIS — N1831 Chronic kidney disease, stage 3a: Secondary | ICD-10-CM | POA: Diagnosis present

## 2021-01-18 DIAGNOSIS — Z823 Family history of stroke: Secondary | ICD-10-CM | POA: Diagnosis not present

## 2021-01-18 DIAGNOSIS — Z8616 Personal history of COVID-19: Secondary | ICD-10-CM | POA: Diagnosis not present

## 2021-01-18 DIAGNOSIS — B9620 Unspecified Escherichia coli [E. coli] as the cause of diseases classified elsewhere: Secondary | ICD-10-CM | POA: Diagnosis present

## 2021-01-18 DIAGNOSIS — E7800 Pure hypercholesterolemia, unspecified: Secondary | ICD-10-CM | POA: Diagnosis present

## 2021-01-18 DIAGNOSIS — D72829 Elevated white blood cell count, unspecified: Secondary | ICD-10-CM | POA: Diagnosis present

## 2021-01-18 DIAGNOSIS — Z888 Allergy status to other drugs, medicaments and biological substances status: Secondary | ICD-10-CM | POA: Diagnosis not present

## 2021-01-18 DIAGNOSIS — I639 Cerebral infarction, unspecified: Secondary | ICD-10-CM | POA: Diagnosis present

## 2021-01-18 DIAGNOSIS — Z87891 Personal history of nicotine dependence: Secondary | ICD-10-CM | POA: Diagnosis not present

## 2021-01-18 DIAGNOSIS — E669 Obesity, unspecified: Secondary | ICD-10-CM | POA: Diagnosis present

## 2021-01-18 DIAGNOSIS — F250 Schizoaffective disorder, bipolar type: Secondary | ICD-10-CM | POA: Diagnosis present

## 2021-01-18 DIAGNOSIS — E1151 Type 2 diabetes mellitus with diabetic peripheral angiopathy without gangrene: Secondary | ICD-10-CM | POA: Diagnosis present

## 2021-01-18 DIAGNOSIS — E1122 Type 2 diabetes mellitus with diabetic chronic kidney disease: Secondary | ICD-10-CM | POA: Diagnosis present

## 2021-01-18 DIAGNOSIS — Z885 Allergy status to narcotic agent status: Secondary | ICD-10-CM | POA: Diagnosis not present

## 2021-01-18 DIAGNOSIS — I69354 Hemiplegia and hemiparesis following cerebral infarction affecting left non-dominant side: Secondary | ICD-10-CM | POA: Diagnosis not present

## 2021-01-18 DIAGNOSIS — G4733 Obstructive sleep apnea (adult) (pediatric): Secondary | ICD-10-CM | POA: Diagnosis present

## 2021-01-18 DIAGNOSIS — I69320 Aphasia following cerebral infarction: Secondary | ICD-10-CM | POA: Diagnosis not present

## 2021-01-18 DIAGNOSIS — D509 Iron deficiency anemia, unspecified: Secondary | ICD-10-CM | POA: Diagnosis present

## 2021-01-18 DIAGNOSIS — F419 Anxiety disorder, unspecified: Secondary | ICD-10-CM | POA: Diagnosis present

## 2021-01-18 DIAGNOSIS — R471 Dysarthria and anarthria: Secondary | ICD-10-CM | POA: Diagnosis present

## 2021-01-18 DIAGNOSIS — E1165 Type 2 diabetes mellitus with hyperglycemia: Secondary | ICD-10-CM | POA: Diagnosis present

## 2021-01-18 DIAGNOSIS — J449 Chronic obstructive pulmonary disease, unspecified: Secondary | ICD-10-CM | POA: Diagnosis present

## 2021-01-18 DIAGNOSIS — N390 Urinary tract infection, site not specified: Secondary | ICD-10-CM | POA: Diagnosis present

## 2021-01-18 DIAGNOSIS — D638 Anemia in other chronic diseases classified elsewhere: Secondary | ICD-10-CM | POA: Diagnosis present

## 2021-01-18 DIAGNOSIS — I129 Hypertensive chronic kidney disease with stage 1 through stage 4 chronic kidney disease, or unspecified chronic kidney disease: Secondary | ICD-10-CM | POA: Diagnosis present

## 2021-01-18 DIAGNOSIS — E785 Hyperlipidemia, unspecified: Secondary | ICD-10-CM | POA: Diagnosis present

## 2021-01-18 LAB — BASIC METABOLIC PANEL
Anion gap: 8 (ref 5–15)
BUN: 31 mg/dL — ABNORMAL HIGH (ref 6–20)
CO2: 27 mmol/L (ref 22–32)
Calcium: 9 mg/dL (ref 8.9–10.3)
Chloride: 101 mmol/L (ref 98–111)
Creatinine, Ser: 0.85 mg/dL (ref 0.44–1.00)
GFR, Estimated: >60 mL/min (ref >60)
Glucose, Bld: 140 mg/dL — ABNORMAL HIGH (ref 70–99)
Potassium: 4 mmol/L (ref 3.5–5.1)
Sodium: 136 mmol/L (ref 135–145)

## 2021-01-18 LAB — CBC WITH DIFFERENTIAL/PLATELET
Abs Immature Granulocytes: 0.65 10*3/uL — ABNORMAL HIGH (ref 0.00–0.07)
Basophils Absolute: 0.1 10*3/uL (ref 0.0–0.1)
Basophils Relative: 0 %
Eosinophils Absolute: 0.2 10*3/uL (ref 0.0–0.5)
Eosinophils Relative: 1 %
HCT: 32.4 % — ABNORMAL LOW (ref 36.0–46.0)
Hemoglobin: 10.6 g/dL — ABNORMAL LOW (ref 12.0–15.0)
Immature Granulocytes: 4 %
Lymphocytes Relative: 47 %
Lymphs Abs: 8.9 10*3/uL — ABNORMAL HIGH (ref 0.7–4.0)
MCH: 22.9 pg — ABNORMAL LOW (ref 26.0–34.0)
MCHC: 32.7 g/dL (ref 30.0–36.0)
MCV: 70 fL — ABNORMAL LOW (ref 80.0–100.0)
Monocytes Absolute: 1.6 10*3/uL — ABNORMAL HIGH (ref 0.1–1.0)
Monocytes Relative: 8 %
Neutro Abs: 7.4 10*3/uL (ref 1.7–7.7)
Neutrophils Relative %: 40 %
Platelets: 391 10*3/uL (ref 150–400)
RBC: 4.63 MIL/uL (ref 3.87–5.11)
RDW: 15.6 % — ABNORMAL HIGH (ref 11.5–15.5)
Smear Review: NORMAL
WBC: 18.8 10*3/uL — ABNORMAL HIGH (ref 4.0–10.5)
nRBC: 0 % (ref 0.0–0.2)

## 2021-01-18 LAB — GLUCOSE, CAPILLARY
Glucose-Capillary: 164 mg/dL — ABNORMAL HIGH (ref 70–99)
Glucose-Capillary: 155 mg/dL — ABNORMAL HIGH (ref 70–99)
Glucose-Capillary: 124 mg/dL — ABNORMAL HIGH (ref 70–99)
Glucose-Capillary: 161 mg/dL — ABNORMAL HIGH (ref 70–99)

## 2021-01-18 LAB — MAGNESIUM: Magnesium: 2 mg/dL (ref 1.7–2.4)

## 2021-01-18 LAB — PATHOLOGIST SMEAR REVIEW

## 2021-01-18 LAB — PHOSPHORUS: Phosphorus: 3.1 mg/dL (ref 2.5–4.6)

## 2021-01-18 MED ORDER — METOPROLOL SUCCINATE ER 50 MG PO TB24
50.0000 mg | ORAL_TABLET | Freq: Every morning | ORAL | Status: DC
  Administered 2021-01-18 – 2021-01-20 (×3): 50 mg via ORAL
  Filled 2021-01-18: qty 1

## 2021-01-18 MED ORDER — GUAIFENESIN-DM 100-10 MG/5ML PO SYRP
5.0000 mL | ORAL_SOLUTION | Freq: Four times a day (QID) | ORAL | Status: DC | PRN
  Administered 2021-01-18: 5 mL via ORAL
  Filled 2021-01-18: qty 5

## 2021-01-18 MED ORDER — AMLODIPINE BESYLATE 10 MG PO TABS
10.0000 mg | ORAL_TABLET | Freq: Every day | ORAL | Status: DC
  Administered 2021-01-18 – 2021-01-20 (×3): 10 mg via ORAL
  Filled 2021-01-18 (×2): qty 1

## 2021-01-18 MED ORDER — LISINOPRIL 20 MG PO TABS
40.0000 mg | ORAL_TABLET | Freq: Every day | ORAL | Status: DC
  Administered 2021-01-18 – 2021-01-20 (×3): 40 mg via ORAL
  Filled 2021-01-18 (×2): qty 2

## 2021-01-18 MED ORDER — CLONIDINE HCL 0.1 MG PO TABS
0.2000 mg | ORAL_TABLET | Freq: Two times a day (BID) | ORAL | Status: DC
  Administered 2021-01-18 – 2021-01-20 (×5): 0.2 mg via ORAL
  Filled 2021-01-18 (×4): qty 2

## 2021-01-18 MED ORDER — HYDRALAZINE HCL 50 MG PO TABS
100.0000 mg | ORAL_TABLET | Freq: Three times a day (TID) | ORAL | Status: DC
  Administered 2021-01-18 – 2021-01-20 (×6): 100 mg via ORAL
  Filled 2021-01-18 (×5): qty 2

## 2021-01-18 NOTE — Plan of Care (Signed)
Pt rested during overnight. BP elevated at beginning of shift. Hydrazaline prn ordered and given. Effective at lowering BP. Pt denied pain. Ambulates with 1 assist with cane. Purewick used during overnight.  Problem: Education: Goal: Knowledge of General Education information will improve Description: Including pain rating scale, medication(s)/side effects and non-pharmacologic comfort measures Outcome: Progressing   Problem: Health Behavior/Discharge Planning: Goal: Ability to manage health-related needs will improve Outcome: Progressing   Problem: Clinical Measurements: Goal: Ability to maintain clinical measurements within normal limits will improve Outcome: Progressing Goal: Will remain free from infection Outcome: Progressing Goal: Diagnostic test results will improve Outcome: Progressing Goal: Respiratory complications will improve Outcome: Progressing Goal: Cardiovascular complication will be avoided Outcome: Progressing   Problem: Activity: Goal: Risk for activity intolerance will decrease Outcome: Progressing   Problem: Nutrition: Goal: Adequate nutrition will be maintained Outcome: Progressing   Problem: Coping: Goal: Level of anxiety will decrease Outcome: Progressing   Problem: Elimination: Goal: Will not experience complications related to bowel motility Outcome: Progressing Goal: Will not experience complications related to urinary retention Outcome: Progressing   Problem: Pain Managment: Goal: General experience of comfort will improve Outcome: Progressing   Problem: Safety: Goal: Ability to remain free from injury will improve Outcome: Progressing   Problem: Skin Integrity: Goal: Risk for impaired skin integrity will decrease Outcome: Progressing   Problem: Education: Goal: Knowledge of General Education information will improve Description: Including pain rating scale, medication(s)/side effects and non-pharmacologic comfort measures Outcome:  Progressing   Problem: Health Behavior/Discharge Planning: Goal: Ability to manage health-related needs will improve Outcome: Progressing   Problem: Clinical Measurements: Goal: Ability to maintain clinical measurements within normal limits will improve Outcome: Progressing Goal: Will remain free from infection Outcome: Progressing Goal: Diagnostic test results will improve Outcome: Progressing Goal: Respiratory complications will improve Outcome: Progressing Goal: Cardiovascular complication will be avoided Outcome: Progressing   Problem: Activity: Goal: Risk for activity intolerance will decrease Outcome: Progressing   Problem: Nutrition: Goal: Adequate nutrition will be maintained Outcome: Progressing   Problem: Coping: Goal: Level of anxiety will decrease Outcome: Progressing   Problem: Elimination: Goal: Will not experience complications related to bowel motility Outcome: Progressing Goal: Will not experience complications related to urinary retention Outcome: Progressing   Problem: Pain Managment: Goal: General experience of comfort will improve Outcome: Progressing   Problem: Safety: Goal: Ability to remain free from injury will improve Outcome: Progressing   Problem: Skin Integrity: Goal: Risk for impaired skin integrity will decrease Outcome: Progressing

## 2021-01-18 NOTE — Progress Notes (Signed)
   01/18/21 0122  Vitals  Temp 98.7 F (37.1 C)  BP (!) 164/97  MAP (mmHg) 116  BP Location Right Arm  BP Method Automatic  Patient Position (if appropriate) Lying  Pulse Rate 77  Pulse Rate Source Monitor  Resp 18  MEWS COLOR  MEWS Score Color Green  Oxygen Therapy  SpO2 100 %  O2 Device Room Air  MEWS Score  MEWS Temp 0  MEWS Systolic 0  MEWS Pulse 0  MEWS RR 0  MEWS LOC 0  MEWS Score 0  BP was elevated at hs. 192/96. B.Morrison NP contacted and hydralazine ordered for prn. Hydralazine given and BP improved at next check.

## 2021-01-18 NOTE — Progress Notes (Addendum)
Speech Language Pathology Treatment: Dysphagia  Patient Details Name: Diamond Rice MRN: [id number] DOB: Aug 15, 1971 Today's Date: 01/18/2021 Time: 0920-0950 SLP Time Calculation (min) (ACUTE ONLY): 30 min  Assessment / Plan / Recommendation Clinical Impression  Pt seen for f/u w/ Education on general aspiration precautions and swallowing strategies to enhance oral management and clearing of food boluses, especially solids. Pt's continued goal is to be able to eat meats in her diet -- she did well per her/NSG report w/ chopped/gravies yesterday at latter meals.  Pt appears to present w/ adequate pharyngeal phase swallow function w/ mild oral phase deficits c/b min slower oral movements and mastication of increased textured trials -- given time, oral phase managemen was appropriate and pt ensured full oral clearing w/ lingual sweeping on Left side. Neuromuscular deficits noted (Chronic and Baseline) c/b Left labial weakness and min slower lingual coordination. Pt consumed po trials w/ No overt, clinical s/s of aspiration during po trials at breakfast meal this morning. Pt appears at reduced risk for aspiration following general aspiration precautions and w/ a slightly modified diet of chopping/mincing meats of a Mech Soft diet consistency for ease of oral phase management.  During this session, focus was on Education of general aspiration precaution including small, single bites/sips to aid oral management of boluses and full clearing. Pt was able describe how she ate her foods at the breakfast meal and used lingual sweeping to check for oral clearing in Left buccal area. Pt stated use of precaution of CUP drinking vs any use of straw to drink -- she agreed this improved control of bolus when drinking.   For the most beneficial diet consistent for pt d/t Baseline, chronic issues, recommend a Mech Soft consistency diet w/ well-Chopped or Minced meats, moistened foods w/ gravies; Thin liquids VIA CUP -  do NOT recommend use of straws for best control. Recommend general aspiration precautions, Pills WHOLE in Puree for safer, easier swallowing as pt described Larger pills causing difficulty to swallow -- she did well w/ this when practiced during session w/ NSG. Education given on Pills in Puree; food consistencies and easy to eat options; general aspiration precautions. NSG to reconsult if any new needs arise. NSG agreed.  Recommend continued f/u w/ ST services at [facility] for Dysarthria to enhance communication w/ ADLs, education on strategies. Encouraged pt to use her Overarticulation strategy to improve speech articulation/clarity; slow down when talking. Pt recently receved Dysathria eval/tx at [facility] in 12/2020 then dx'd to SNF and followed there. Pt stated she had only had 2 visits from the Speech Therapist at [facility]. Updated the SW/CM.  Handouts given on above.    HPI HPI: Pt is a 50 yo female with a h/o 4 prior strokes w/ Residual Left sided weakness and Dysarthria and extensive PMH including: DM2, HTN, HLD, smoking, prior cocaine, substance abuse, PE, depression, medication nonadhering. Patient is presenting from [facility] where she was being cared for after discharge for recurrent stroke at the end of April 2022. She was previously at [facility] for txs. She has residual Left-sided deficits from that stroke as well as from previous stroke in 2021 (per pt report).  She was last seen normal around 8:15 PM today. She was diagnosed with COVID 14 days ago and is been in isolation unit.  This evening she was noted to have left facial droop, right-sided numbness, slurred speech, expressive aphasia which has improved.  Pt has had previous MBSSs (dysphagia in 11/2019) but cleared for  a Regular diet w/ thin liquids at [facility] in 2022.  MRI this admit: Fading diffusion restriction in the left corona radiata  consistent with late subacute to chronic infarct. No new site of  infarction or  hemorrhage.  2. Multiple chronic microhemorrhages in a predominantly central  distribution, consistent with cerebral amyloid angiopathy. CXR: No radiographic evidence of acute cardiopulmonary abnormality.      SLP Plan  All goals met       Recommendations  Diet recommendations: Dysphagia 3 (mechanical soft);Thin liquid (meats chopped/minced) Liquids provided via: Cup;Straw Medication Administration: Whole meds with puree (for safer swallowing) Supervision: Patient able to self feed Compensations: Slow rate;Small sips/bites;Follow solids with liquid;Lingual sweep for clearance of pocketing Postural Changes and/or Swallow Maneuvers: Seated upright 90 degrees;Upright 30-60 min after meal                General recommendations:  (dietician f/u) Oral Care Recommendations: Oral care BID;Oral care before and after PO;Staff/trained caregiver to provide oral care Follow up Recommendations: Skilled Nursing facility (return to) SLP Visit Diagnosis: Dysphagia, oral phase (R13.11) (baseline, chronic) Plan: All goals met       GO                  Orinda Kenner, MS, CCC-SLP Speech Language Pathologist Rehab Services [phone] [place] [facility] 01/18/2021, 10:06 AM

## 2021-01-18 NOTE — Progress Notes (Signed)
PROGRESS NOTE  Diamond Rice  DOB: 07-22-1971  PCP: System, Provider Not In [id number]  DOA: 01/16/2021  LOS: 0 days  Hospital Day: 3   Chief Complaint  Patient presents with  . Code Stroke    Brief narrative: Diamond Rice is a 50 y.o. female with PMH significant for recurrent stroke most recently 20 days ago with residual left-sided weakness, history of ICH, obesity, OSA, DM2, HTN, PVD, CKD 3A, chronic anemia, substance abuse, COPD, DVT/PE, depression.  Patient presented to the ED on 5/22 with new focal neurologic deficits. 5/2-5/9, patient was hospitalized for acute CVA and discharged to [facility]. Diagnosed with COVID 14 days ago and was in isolation. Last seen normal around 8:15 PM on 5/22.  Few hours later, she was noted to have left facial droop, right-sided numbness, slurred speech, expressive aphasia and hence brought to ED.  In the ED, patient was hemodynamically stable except for blood pressure elevated 154/92 Labs with glucose level elevated 366, creatinine 1.17 COVID PCR positive. CT head without acute abnormality but shows multiple old infarcts.   CT angio head and neck without acute abnormality.  Neurology was consulted in the ED and recommended against tPA due to recent stroke and history of hemorrhagic stroke as well.   Patient was admitted to hospitalist service for further stroke work-up. Urinalysis obtained on admission showed clear yellow urine with trace leukocytes, many bacteria and nitrate positive.  Subjective: Patient was seen and examined this morning.  Sitting up in chair.  Feels better.  Speech improved.  Blood pressure running elevated.  Assessment/Plan: Strokelike symptoms - Acute stroke ruled out History of recurrent strokes -Patient presented with acute strokelike symptoms including left cervical droop, right-sided numbness, slurred speech, expressive aphasia. -Pt with h/o 4 prior strokes: 3 ischemic, 1 hemorrhagic. Pt has  chronic L sided deficits from the hemorrhagic stroke. Most recent stroke was ischemic in April of 2021. Probably related to poor control of risk factors. -CT head and CT angio head and neck were without acute abnormality. -MRI brain did not show any acute infarction. -Neurology consultation was obtained.  Patient probably had pronounced stroke symptoms from previous stroke probably due to an acute UTI. -Continue aspirin and statin -Echo was done recently. -5/3, blood work showed A1c 6.6 and LDL 72, LDL 60. -Continue telemetry monitoring. -PT/OT/ST eval obtained.  Essential hypertension -Home meds include amlodipine, clonidine, hydralazine, lisinopril, metoprolol. -Resume all meds this morning.  UTI -Urinalysis obtained on admission showed clear yellow urine with trace leukocytes, many bacteria and nitrate positive.  Urine culture sent. -No fever.  WBC count continues to rise. -Currently on IV Rocephin. Recent Labs  Lab 01/11/21 1709 01/16/21 2316 01/18/21 0504  WBC 10.5 13.4* 18.8*   Recent COVID infection -Patient tested positive for COVID while in the SNF. -Outside the window for precautions at this time.  -Chest x-ray normal. -Robitussin for cough.  Mild microcytic anemia -Hemoglobin currently within 10-11.  Trend CBC. -Continue Protonix Recent Labs    12/29/20 0230 01/04/21 1245 01/11/21 1709 01/16/21 2316 01/18/21 0504  HGB 12.3 11.9* 10.6* 10.5* 10.6*  MCV 73.0* 71.4* 72.3* 71.5* 70.0*   CKD 3A Creatinine stable. Recent Labs    10/27/20 1302 12/27/20 1749 12/27/20 1755 12/29/20 0230 12/30/20 0406 12/31/20 0354 01/04/21 1245 01/11/21 1709 01/16/21 2316 01/18/21 0504  BUN 11 11 12 11 11 10 17  24* 33* 31*  CREATININE 1.20* 1.04* 0.90 1.02* 1.00 1.05* 1.03* 1.11* 1.17* 0.85   Type 2 diabetes mellitus Hyperglycemia -  A1c 6.6 on 5/3 -Not on antidiabetic regimen at home. -Blood sugar level on admission was elevated probably because of Decadron that  she was taking at the SNF meant for 10 days for COVID.   -Currently on Lantus 10 units daily and sliding scale insulin with Accu-Cheks. Recent Labs  Lab 01/17/21 1255 01/17/21 1627 01/17/21 2136 01/18/21 0758 01/18/21 1134  GLUCAP 126* 219* 148* 164* 155*   PVD Hyperlipidemia -Continue home statin  OSA -Continue CPAP  Depression Schizoaffective disorder -Continue home Abilify when tolerating p.o.  Mobility: PT/OT eval appreciated Code Status:   Code Status: Full Code  Nutritional status: There is no height or weight on file to calculate BMI.     Diet Order            DIET DYS 3 Room service appropriate? Yes with Assist; Fluid consistency: Thin  Diet effective now                 DVT prophylaxis: enoxaparin (LOVENOX) injection 30 mg Start: 01/17/21 0100   Antimicrobials:  IV Rocephin Fluid: Not on IV hydration Consultants: Neurology signed off Family Communication:  Not at bedside  Status is: Inpatient  Remains inpatient appropriate because: Pending urine culture report, WBC rising  Dispo: The patient is from: SNF              Anticipated d/c is to: Back to SNF likely in 1 to 2 days              Patient currently is not medically stable to d/c.   Difficult to place patient No     Infusions:  . cefTRIAXone (ROCEPHIN)  IV Stopped (01/18/21 1238)    Scheduled Meds: . amLODipine  10 mg Oral Daily  . ARIPiprazole  5 mg Oral Daily  . aspirin EC  81 mg Oral Daily  . atorvastatin  80 mg Oral q1800  . cloNIDine  0.2 mg Oral BID  . enoxaparin (LOVENOX) injection  30 mg Subcutaneous Q12H  . hydrALAZINE  100 mg Oral TID  . insulin aspart  0-15 Units Subcutaneous TID WC  . insulin aspart  0-5 Units Subcutaneous QHS  . insulin glargine  10 Units Subcutaneous Daily  . lisinopril  40 mg Oral Daily  . mouth rinse  15 mL Mouth Rinse BID  . metoprolol succinate  50 mg Oral q morning  . pantoprazole  40 mg Oral Daily    Antimicrobials: Anti-infectives (From  admission, onward)   Start     Dose/Rate Route Frequency Ordered Stop   01/17/21 0215  cefTRIAXone (ROCEPHIN) 1 g in sodium chloride 0.9 % 100 mL IVPB        1 g 200 mL/hr over 30 Minutes Intravenous Every 24 hours 01/17/21 0130        PRN meds: acetaminophen **OR** acetaminophen (TYLENOL) oral liquid 160 mg/5 mL **OR** acetaminophen, guaiFENesin-dextromethorphan, hydrALAZINE, senna-docusate   Objective: Vitals:   01/18/21 0522 01/18/21 1133  BP: (!) 170/95 (!) 168/121  Pulse: 68 79  Resp: 18 17  Temp: 98.5 F (36.9 C) 98.4 F (36.9 C)  SpO2: 100% 100%    Intake/Output Summary (Last 24 hours) at 01/18/2021 1325 Last data filed at 01/18/2021 0542 Gross per 24 hour  Intake --  Output 1400 ml  Net -1400 ml   There were no vitals filed for this visit. Weight change:  There is no height or weight on file to calculate BMI.   Physical Exam: General exam: Pleasant middle-aged African-American female.  Feels better Skin: No rashes, lesions or ulcers. HEENT: Atraumatic, normocephalic, no obvious bleeding Lungs: Clear to auscultation bilaterally CVS: Regular rate and rhythm, no murmur GI/Abd soft, nontender, nondistended, bowel sound present CNS: Alert, awake, oriented x3, dysarthria improving.  Left hemiparesis persists. Psychiatry: Cheerful to know that she does not have a new stroke Extremities: No pedal edema, no calf tenderness  Data Review: I have personally reviewed the laboratory data and studies available.  Recent Labs  Lab 01/11/21 1709 01/16/21 2316 01/18/21 0504  WBC 10.5 13.4* 18.8*  NEUTROABS 5.1 9.2* 7.4  HGB 10.6* 10.5* 10.6*  HCT 33.7* 32.6* 32.4*  MCV 72.3* 71.5* 70.0*  PLT 362 388 391   Recent Labs  Lab 01/11/21 1709 01/16/21 2316 01/18/21 0504  NA 134* 135 136  K 3.8 4.3 4.0  CL 100 100 101  CO2 27 24 27   GLUCOSE 200* 366* 140*  BUN 24* 33* 31*  CREATININE 1.11* 1.17* 0.85  CALCIUM 9.4 9.3 9.0  MG  --   --  2.0  PHOS  --   --  3.1     F/u labs ordered Unresulted Labs (From admission, onward)          Start     Ordered   01/24/21 0500  Creatinine, serum  (enoxaparin (LOVENOX)    CrCl >/= 30 with major trauma, spinal cord injury, or selected orthopedic surgery)  Weekly,   STAT     Comments: while on enoxaparin therapy.    01/17/21 0039   01/18/21 0500  CBC with Differential/Platelet  Daily,   R      01/17/21 1110   01/18/21 3343  Basic metabolic panel  Daily,   R      01/17/21 1110   01/17/21 0857  Urine Culture  Once,   R       Question:  List patient's active antibiotics  Answer:  can add on previously collected urine.   01/17/21 0857          Signed, Terrilee Croak, MD [facility] 01/18/2021

## 2021-01-18 NOTE — Progress Notes (Signed)
Physical Therapy Treatment Patient Details Name: Diamond Rice MRN: [id number] DOB: June 18, 1971 Today's Date: 01/18/2021    History of Present Illness 50 y.o. female with PMH significant for recurrent stroke most recently ~3 weeks prior to this admission with residual left-sided weakness, obesity, OSA, DM2, HTN, PVD, CKD 3A, chronic anemia, substance abuse, COPD, DVT/PE, depression. Hospitalized for acute CVA 5/2-9  and discharged to [facility]. Pt was diagnosed with COVID 14 days ago and was in isolation.  On 5/22, pt was noted to have left facial droop, right-sided numbness, slurred speech, expressive aphasia and was brought to ED for stroke work-up.    PT Comments    Pt was eager to get up and do some ambulation/mobility in the room. She had slow, guarded and AD reliant gait with consistent but low grade and easily self arrested buckling at the L knee.  She did not endorse excessive fatigue but HR rose to 120s relatively quickly and did top out in the low 130s by the end of the effort.  Pt report that she does not feel too far from her recent baseline.  Pt pleasant and motivated t/o session, hoping to finish out her planned rehab course (another ~2 weeks) at discharge.     Follow Up Recommendations  SNF (per progress, available assistance (and insurance coverage?) may consider HHPT)     Equipment Recommendations  3in1 (PT) (pt reports she needs a new shower chair (does have suction hand rail to get in/out shower)    Recommendations for Other Services       Precautions / Restrictions Precautions Precautions: Fall Restrictions Weight Bearing Restrictions: No    Mobility  Bed Mobility Overal bed mobility: Modified Independent Bed Mobility: Supine to Sit     Supine to sit: Min guard     General bed mobility comments: Pt was able to get herself to sitting EOB w/o assist, slow but safe and confident    Transfers Overall transfer level: Modified independent Equipment  used: Quad cane Transfers: Sit to/from Stand Sit to Stand: Min guard         General transfer comment: Pt able to rise to standing from multiple surfaces this session, showed good effort with heavy reliance on R UE/QC and LE  Ambulation/Gait Ambulation/Gait assistance: Min guard Gait Distance (Feet): 150 Feet Assistive device: Quad cane       General Gait Details: Pt with consistently slow and cautious cadence with heavy QC use. She did have regular low-grade buckling in the L knee that she reports is essentially baseline (assures me she has consistent "catch" in L knee and has not had any recent falls).  Pt's HR slowly crept from low 100s to ~130 with persistent upright/walking activity, O2 sustained in the 90s.  She did not have any LOBs but did start to have some fatigue.   Stairs             Wheelchair Mobility    Modified Rankin (Stroke Patients Only)       Balance Overall balance assessment: Needs assistance Sitting-balance support: No upper extremity supported Sitting balance-Leahy Scale: Good     Standing balance support: Single extremity supported Standing balance-Leahy Scale: Fair Standing balance comment: Pt was able to maintain balance w/ heavy UE/QC use.  Though she had consistent low grade L LE buckling she did not have any near LOBs.  Cognition Arousal/Alertness: Awake/alert Behavior During Therapy: WFL for tasks assessed/performed Overall Cognitive Status: Within Functional Limits for tasks assessed                                        Exercises      General Comments        Pertinent Vitals/Pain Pain Assessment: No/denies pain    Home Living                      Prior Function            PT Goals (current goals can now be found in the care plan section) Progress towards PT goals: Progressing toward goals    Frequency    Min 2X/week      PT Plan Frequency  needs to be updated    Co-evaluation              AM-PAC PT "6 Clicks" Mobility   Outcome Measure  Help needed turning from your back to your side while in a flat bed without using bedrails?: None Help needed moving from lying on your back to sitting on the side of a flat bed without using bedrails?: None Help needed moving to and from a bed to a chair (including a wheelchair)?: A Little Help needed standing up from a chair using your arms (e.g., wheelchair or bedside chair)?: A Little Help needed to walk in hospital room?: A Little Help needed climbing 3-5 steps with a railing? : A Little 6 Click Score: 20    End of Session Equipment Utilized During Treatment: Gait belt Activity Tolerance: Patient tolerated treatment well Patient left: in chair;with call bell/phone within reach;with chair alarm set Nurse Communication: Mobility status PT Visit Diagnosis: Unsteadiness on feet (R26.81);Muscle weakness (generalized) (M62.81);Difficulty in walking, not elsewhere classified (R26.2)     Time: 3300-7622 PT Time Calculation (min) (ACUTE ONLY): 31 min  Charges:  $Gait Training: 8-22 mins $Therapeutic Activity: 8-22 mins                     Kreg Shropshire, DPT 01/18/2021, 12:58 PM

## 2021-01-19 DIAGNOSIS — N390 Urinary tract infection, site not specified: Secondary | ICD-10-CM | POA: Diagnosis not present

## 2021-01-19 DIAGNOSIS — D638 Anemia in other chronic diseases classified elsewhere: Secondary | ICD-10-CM

## 2021-01-19 LAB — CBC WITH DIFFERENTIAL/PLATELET
Abs Immature Granulocytes: 0.31 10*3/uL — ABNORMAL HIGH (ref 0.00–0.07)
Basophils Absolute: 0.1 10*3/uL (ref 0.0–0.1)
Basophils Relative: 0 %
Eosinophils Absolute: 0.5 10*3/uL (ref 0.0–0.5)
Eosinophils Relative: 3 %
HCT: 30 % — ABNORMAL LOW (ref 36.0–46.0)
Hemoglobin: 10 g/dL — ABNORMAL LOW (ref 12.0–15.0)
Immature Granulocytes: 2 %
Lymphocytes Relative: 50 %
Lymphs Abs: 8.3 10*3/uL — ABNORMAL HIGH (ref 0.7–4.0)
MCH: 23.4 pg — ABNORMAL LOW (ref 26.0–34.0)
MCHC: 33.3 g/dL (ref 30.0–36.0)
MCV: 70.3 fL — ABNORMAL LOW (ref 80.0–100.0)
Monocytes Absolute: 1.6 10*3/uL — ABNORMAL HIGH (ref 0.1–1.0)
Monocytes Relative: 10 %
Neutro Abs: 5.8 10*3/uL (ref 1.7–7.7)
Neutrophils Relative %: 35 %
Platelets: 379 10*3/uL (ref 150–400)
RBC: 4.27 MIL/uL (ref 3.87–5.11)
RDW: 15.9 % — ABNORMAL HIGH (ref 11.5–15.5)
Smear Review: NORMAL
WBC: 16.5 10*3/uL — ABNORMAL HIGH (ref 4.0–10.5)
nRBC: 0 % (ref 0.0–0.2)

## 2021-01-19 LAB — BASIC METABOLIC PANEL
Anion gap: 8 (ref 5–15)
BUN: 35 mg/dL — ABNORMAL HIGH (ref 6–20)
CO2: 28 mmol/L (ref 22–32)
Calcium: 8.7 mg/dL — ABNORMAL LOW (ref 8.9–10.3)
Chloride: 103 mmol/L (ref 98–111)
Creatinine, Ser: 1.15 mg/dL — ABNORMAL HIGH (ref 0.44–1.00)
GFR, Estimated: 59 mL/min — ABNORMAL LOW (ref >60)
Glucose, Bld: 203 mg/dL — ABNORMAL HIGH (ref 70–99)
Potassium: 4 mmol/L (ref 3.5–5.1)
Sodium: 139 mmol/L (ref 135–145)

## 2021-01-19 LAB — GLUCOSE, CAPILLARY
Glucose-Capillary: 159 mg/dL — ABNORMAL HIGH (ref 70–99)
Glucose-Capillary: 135 mg/dL — ABNORMAL HIGH (ref 70–99)
Glucose-Capillary: 144 mg/dL — ABNORMAL HIGH (ref 70–99)
Glucose-Capillary: 182 mg/dL — ABNORMAL HIGH (ref 70–99)

## 2021-01-19 NOTE — Progress Notes (Signed)
Patient placed on 3LPM oxygen at bedtime due to patient not wearing CPAP due to having COVID.

## 2021-01-19 NOTE — Progress Notes (Signed)
Occupational Therapy Treatment Patient Details Name: Diamond Rice MRN: [id number] DOB: 1971/02/24 Today's Date: 01/19/2021    History of present illness 50 y.o. female with PMH significant for recurrent stroke most recently ~3 weeks prior to this admission with residual left-sided weakness, obesity, OSA, DM2, HTN, PVD, CKD 3A, chronic anemia, substance abuse, COPD, DVT/PE, depression. Hospitalized for acute CVA 5/2-9  and discharged to [name]. Pt was diagnosed with COVID 14 days ago and was in isolation.  On 5/22, pt was noted to have left facial droop, right-sided numbness, slurred speech, expressive aphasia and was brought to ED for stroke work-up.   OT comments  Pt seen for OT tx this date to f/u re: safety with ADLs/ADL mobility. OT engages pt in seated self-assisted (cues to use modified technique, R hand to assist L hand) grooming and UB dressing (hemi-dsg technique) while seated EOB. Pt requries CGA for sup to sit and MIN A/CGA for SPS with QC from EOB to chair. In chair, OT assists with SETUP for meal tray with MIN cues. Pt continues to benefit from skilled OT. Pt left in chair with all needs met and in reach. CNA notified of status in which pt left. Will continue to follow acutely. Continue to anticipate that pt will require STR f/u OT services to restore strength and safety with ADLs/ADL mobility to highest attainable level.    Follow Up Recommendations  SNF    Equipment Recommendations  None recommended by OT    Recommendations for Other Services      Precautions / Restrictions Precautions Precautions: Fall Restrictions Weight Bearing Restrictions: No       Mobility Bed Mobility Overal bed mobility: Needs Assistance Bed Mobility: Supine to Sit     Supine to sit: Min guard;HOB elevated     General bed mobility comments: increased time, use of bed rails, HOB elevated.    Transfers Overall transfer level: Modified independent Equipment used: Quad  cane Transfers: Sit to/from [employer] to Stand: Min guard Stand pivot transfers: Min assist       General transfer comment: increased time, support posteriorly for pivot, cues to sequence (feel for chair behind LEs before sitting)    Balance Overall balance assessment: Needs assistance Sitting-balance support: No upper extremity supported Sitting balance-Leahy Scale: Good Sitting balance - Comments: G static sitting   Standing balance support: Single extremity supported Standing balance-Leahy Scale: Fair Standing balance comment: requires R UE support                           ADL either performed or assessed with clinical judgement   ADL Overall ADL's : Needs assistance/impaired Eating/Feeding: Set up;Sitting Eating/Feeding Details (indicate cue type and reason): supported sitting in chair Grooming: Wash/dry hands;Set up;Sitting           Upper Body Dressing : Minimal assistance;Sitting Upper Body Dressing Details (indicate cue type and reason): Min A to don gown using modified technique, ed re: hemi dsg                         Vision Patient Visual Report: No change from baseline     Perception     Praxis      Cognition Arousal/Alertness: Awake/alert Behavior During Therapy: WFL for tasks assessed/performed Overall Cognitive Status: Within Functional Limits for tasks assessed  General Comments: Pt extremely motivated to participate in therapy, verbalizing personal goals she has for OT/PT        Exercises Other Exercises Other Exercises: OT engages pt in seated self-assisted (cues to use modified technique, R hand to assist L hand) grooming and UB dressing (hemi-dsg technique) while seated EOB. Pt requries CGA for sup to sit and MIN A/CGA for SPS with QC from EOB to chair. In chair, OT assists with SETUP for meal tray with MIN cues.   Shoulder Instructions       General  Comments      Pertinent Vitals/ Pain       Pain Assessment: No/denies pain  Home Living                                          Prior Functioning/Environment              Frequency  Min 2X/week        Progress Toward Goals  OT Goals(current goals can now be found in the care plan section)  Progress towards OT goals: Progressing toward goals  Acute Rehab OT Goals Patient Stated Goal: to regain independence OT Goal Formulation: With patient Time For Goal Achievement: 01/31/21 Potential to Achieve Goals: Good  Plan Discharge plan remains appropriate    Co-evaluation                 AM-PAC OT "6 Clicks" Daily Activity     Outcome Measure   Help from another person eating meals?: None Help from another person taking care of personal grooming?: A Little Help from another person toileting, which includes using toliet, bedpan, or urinal?: A Little Help from another person bathing (including washing, rinsing, drying)?: A Little Help from another person to put on and taking off regular upper body clothing?: A Little Help from another person to put on and taking off regular lower body clothing?: A Little 6 Click Score: 19    End of Session Equipment Utilized During Treatment: Gait belt;Other (comment) (qc)  OT Visit Diagnosis: Unsteadiness on feet (R26.81);Other abnormalities of gait and mobility (R26.89);Muscle weakness (generalized) (M62.81)   Activity Tolerance Patient tolerated treatment well   Patient Left in chair;with call bell/phone within reach;with chair alarm set   Nurse Communication Mobility status        Time: 1653-1710 OT Time Calculation (min): 17 min  Charges: OT General Charges $OT Visit: 1 Visit OT Treatments $Self Care/Home Management : 8-22 mins  Gerrianne Scale, [place], OTR/L ascom [phone] 01/19/21, 5:49 PM

## 2021-01-19 NOTE — TOC Progression Note (Signed)
Transition of Care [facility]) - Progression Note    Patient Details  Name: Diamond Rice MRN: [id number] Date of Birth: August 03, 1971  Transition of Care [facility]) CM/SW [place], RN Phone Number: 01/19/2021, 3:09 PM  Clinical Narrative:   TOC contacted patient by phone due to isolation due to [place].  Patient would like to go back to [facility] informed her that she can go back to [facility] tomorrow. As per Kenney Houseman, patient can return COVID positive, as she tested positive at their facility.  Patient can return tomorrow if medically ready for discharge.         Expected Discharge Plan and Services                                                 Social Determinants of Health (SDOH) Interventions    Readmission Risk Interventions No flowsheet data found.

## 2021-01-19 NOTE — Progress Notes (Signed)
PROGRESS NOTE  Diamond Rice  DOB: 05-01-1971  PCP: System, Provider Not In HUD:[id number]  DOA: 01/16/2021  LOS: 1 day  Hospital Day: 4   Chief Complaint  Patient presents with  . Code Stroke    Brief narrative: Diamond Rice is a 50 y.o. female with PMH significant for recurrent stroke most recently 20 days ago with residual left-sided weakness, history of ICH, obesity, OSA, DM2, HTN, PVD, CKD 3A, chronic anemia, substance abuse, COPD, DVT/PE, depression.  Patient presented to the ED on 5/22 with new focal neurologic deficits. 5/2-5/9, patient was hospitalized for acute CVA and discharged to [place]. Diagnosed with COVID 14 days ago and was in isolation. Last seen normal around 8:15 PM on 5/22.  Few hours later, she was noted to have left facial droop, right-sided numbness, slurred speech, expressive aphasia and hence brought to ED.  In the ED, patient was hemodynamically stable except for blood pressure elevated 154/92 Labs with glucose level elevated 366, creatinine 1.17 COVID PCR positive. CT head without acute abnormality but shows multiple old infarcts.   CT angio head and neck without acute abnormality.  Neurology was consulted in the ED and recommended against tPA due to recent stroke and history of hemorrhagic stroke as well.   Patient was admitted to hospitalist service for further stroke work-up. Urinalysis obtained on admission showed clear yellow urine with trace leukocytes, many bacteria and nitrate positive.  5/25- pt feeling better. Answering appropriately. Denies sob, cp, abd pain  Subjective: As above  Assessment/Plan: Strokelike symptoms - Acute stroke ruled out History of recurrent strokes -Patient presented with acute strokelike symptoms including left cervical droop, right-sided numbness, slurred speech, expressive aphasia. -Pt with h/o 4 prior strokes: 3 ischemic, 1 hemorrhagic. Pt has chronic L sided deficits from the hemorrhagic  stroke. Most recent stroke was ischemic in April of 2021. Probably related to poor control of risk factors. -CT head and CT angio head and neck were without acute abnormality. -MRI brain did not show any acute infarction. 5/25- neurology consulted-felt Sx favored to be recrudescence, signif. Improvement after tx with iv abx for UTI.  Continue aspirin and statin Echo was done recently   Essential hypertension BP stable Continue lisinopril, beta-blockers, hydralazine, amlodipine and clonidine   E. coli UTI UCx +, sensitivities pending. Wbc improving. Continue IV Rocephin until sensitivity returns    Recent COVID infection -Patient tested positive for COVID while in the SNF. -Outside the window for precautions at this time.  5/25-cxr nml Continue to monitor  Mild microcytic anemia Hg stable Continue to monitor  CKD 3A Creatinine stable and at baseline   Type 2 diabetes mellitus Hyperglycemia -A1c 6.6 on 5/3 BG stable RISS Continue lantus  PVD Hyperlipidemia -Continue home statin  OSA -Continue CPAP  Depression Schizoaffective disorder -Continue home Abilify when tolerating p.o.   Code Status: full  DVT prophylaxis: enoxaparin (LOVENOX) injection 30 mg Start: 01/17/21 0100   Antimicrobials:  IV Rocephin  Consultants: Neurology signed off Family Communication:  Not at bedside  Status is: Inpatient  Remains inpatient appropriate because: Pending urine culture report, WBC rising  Dispo: The patient is from: SNF              Anticipated d/c is to: SNF              Patient currently is not medically stable to d/c.   Difficult to place patient No     Infusions:  . cefTRIAXone (ROCEPHIN)  IV Stopped (01/19/21  1006)    Scheduled Meds: . amLODipine  10 mg Oral Daily  . ARIPiprazole  5 mg Oral Daily  . aspirin EC  81 mg Oral Daily  . atorvastatin  80 mg Oral q1800  . cloNIDine  0.2 mg Oral BID  . enoxaparin (LOVENOX) injection  30 mg  Subcutaneous Q12H  . hydrALAZINE  100 mg Oral TID  . insulin aspart  0-15 Units Subcutaneous TID WC  . insulin aspart  0-5 Units Subcutaneous QHS  . insulin glargine  10 Units Subcutaneous Daily  . lisinopril  40 mg Oral Daily  . mouth rinse  15 mL Mouth Rinse BID  . metoprolol succinate  50 mg Oral q morning  . pantoprazole  40 mg Oral Daily    Antimicrobials: Anti-infectives (From admission, onward)   Start     Dose/Rate Route Frequency Ordered Stop   01/17/21 0215  cefTRIAXone (ROCEPHIN) 1 g in sodium chloride 0.9 % 100 mL IVPB        1 g 200 mL/hr over 30 Minutes Intravenous Every 24 hours 01/17/21 0130        PRN meds: acetaminophen **OR** acetaminophen (TYLENOL) oral liquid 160 mg/5 mL **OR** acetaminophen, guaiFENesin-dextromethorphan, hydrALAZINE, senna-docusate   Objective: Vitals:   01/19/21 1155 01/19/21 1555  BP: 123/75 126/83  Pulse: 81 85  Resp: (!) 21 (!) 22  Temp: 97.7 F (36.5 C) 98.4 F (36.9 C)  SpO2: 96% 98%    Intake/Output Summary (Last 24 hours) at 01/19/2021 1645 Last data filed at 01/19/2021 0100 Gross per 24 hour  Intake --  Output 400 ml  Net -400 ml   There were no vitals filed for this visit. Weight change:  There is no height or weight on file to calculate BMI.   Physical Exam: Calm, nad cta no w/r/r Regular s1/s2 no gallop Soft benign +bs No edema Awake and alert. .   Mood and affect appropriate in current setting   Recent Labs  Lab 01/16/21 2316 01/18/21 0504 01/19/21 0402  WBC 13.4* 18.8* 16.5*  NEUTROABS 9.2* 7.4 5.8  HGB 10.5* 10.6* 10.0*  HCT 32.6* 32.4* 30.0*  MCV 71.5* 70.0* 70.3*  PLT 388 391 379   Recent Labs  Lab 01/16/21 2316 01/18/21 0504 01/19/21 0402  NA 135 136 139  K 4.3 4.0 4.0  CL 100 101 103  CO2 24 27 28   GLUCOSE 366* 140* 203*  BUN 33* 31* 35*  CREATININE 1.17* 0.85 1.15*  CALCIUM 9.3 9.0 8.7*  MG  --  2.0  --   PHOS  --  3.1  --     F/u labs ordered Unresulted Labs (From admission,  onward)          Start     Ordered   01/24/21 0500  Creatinine, serum  (enoxaparin (LOVENOX)    CrCl >/= 30 with major trauma, spinal cord injury, or selected orthopedic surgery)  Weekly,   STAT     Comments: while on enoxaparin therapy.    01/17/21 0039   01/18/21 0500  CBC with Differential/Platelet  Daily,   R      01/17/21 1110   01/18/21 8786  Basic metabolic panel  Daily,   R      01/17/21 1110          Signed, Nolberto Hanlon, MD [facility] 01/19/2021    Time spent: 35 min with >50% on coc

## 2021-01-19 NOTE — Plan of Care (Signed)

## 2021-01-20 DIAGNOSIS — N390 Urinary tract infection, site not specified: Secondary | ICD-10-CM | POA: Diagnosis not present

## 2021-01-20 DIAGNOSIS — N1831 Chronic kidney disease, stage 3a: Secondary | ICD-10-CM

## 2021-01-20 DIAGNOSIS — D638 Anemia in other chronic diseases classified elsewhere: Secondary | ICD-10-CM | POA: Diagnosis not present

## 2021-01-20 LAB — CBC WITH DIFFERENTIAL/PLATELET
Abs Immature Granulocytes: 0.2 10*3/uL — ABNORMAL HIGH (ref 0.00–0.07)
Basophils Absolute: 0.1 10*3/uL (ref 0.0–0.1)
Basophils Relative: 0 %
Eosinophils Absolute: 0.6 10*3/uL — ABNORMAL HIGH (ref 0.0–0.5)
Eosinophils Relative: 4 %
HCT: 28.7 % — ABNORMAL LOW (ref 36.0–46.0)
Hemoglobin: 9.6 g/dL — ABNORMAL LOW (ref 12.0–15.0)
Immature Granulocytes: 1 %
Lymphocytes Relative: 44 %
Lymphs Abs: 6.1 10*3/uL — ABNORMAL HIGH (ref 0.7–4.0)
MCH: 23.5 pg — ABNORMAL LOW (ref 26.0–34.0)
MCHC: 33.4 g/dL (ref 30.0–36.0)
MCV: 70.2 fL — ABNORMAL LOW (ref 80.0–100.0)
Monocytes Absolute: 1.5 10*3/uL — ABNORMAL HIGH (ref 0.1–1.0)
Monocytes Relative: 11 %
Neutro Abs: 5.6 10*3/uL (ref 1.7–7.7)
Neutrophils Relative %: 40 %
Platelets: 329 10*3/uL (ref 150–400)
RBC: 4.09 MIL/uL (ref 3.87–5.11)
RDW: 15.7 % — ABNORMAL HIGH (ref 11.5–15.5)
Smear Review: NORMAL
WBC: 14 10*3/uL — ABNORMAL HIGH (ref 4.0–10.5)
nRBC: 0 % (ref 0.0–0.2)

## 2021-01-20 LAB — GLUCOSE, CAPILLARY
Glucose-Capillary: 165 mg/dL — ABNORMAL HIGH (ref 70–99)
Glucose-Capillary: 99 mg/dL (ref 70–99)

## 2021-01-20 LAB — URINE CULTURE: Culture: >=100000 — AB

## 2021-01-20 LAB — BASIC METABOLIC PANEL
Anion gap: 9 (ref 5–15)
BUN: 38 mg/dL — ABNORMAL HIGH (ref 6–20)
CO2: 25 mmol/L (ref 22–32)
Calcium: 8.7 mg/dL — ABNORMAL LOW (ref 8.9–10.3)
Chloride: 104 mmol/L (ref 98–111)
Creatinine, Ser: 1.23 mg/dL — ABNORMAL HIGH (ref 0.44–1.00)
GFR, Estimated: 54 mL/min — ABNORMAL LOW (ref >60)
Glucose, Bld: 237 mg/dL — ABNORMAL HIGH (ref 70–99)
Potassium: 4.2 mmol/L (ref 3.5–5.1)
Sodium: 138 mmol/L (ref 135–145)

## 2021-01-20 MED ORDER — INSULIN GLARGINE 100 UNIT/ML ~~LOC~~ SOLN: 10.0000 [IU] | Freq: Every day | SUBCUTANEOUS | 0 refills | Status: DC

## 2021-01-20 MED ORDER — CIPROFLOXACIN HCL 250 MG PO TABS: 250.0000 mg | ORAL_TABLET | Freq: Two times a day (BID) | ORAL | Status: DC

## 2021-01-20 MED ORDER — CIPROFLOXACIN HCL 500 MG PO TABS: 250.0000 mg | ORAL_TABLET | Freq: Two times a day (BID) | ORAL | Status: DC

## 2021-01-20 NOTE — NC FL2 (Signed)
[place] LEVEL OF CARE SCREENING TOOL     IDENTIFICATION  [name] Birthdate: 07/23/1971 Sex: female Admission Date (Current Location): 01/16/2021  [place] and [place] Number:  Engineering geologist and Address:  [facility], [address] [place], [place] [address]      Provider Number: [id number]  Attending Physician Name and Address:  Nolberto Hanlon, MD  Relative Name and Phone Number:       Current Level of Care: Hospital Recommended Level of Care: [place] Prior Approval Number:    Date Approved/Denied:   PASRR Number: [id number]  Discharge Plan: SNF    Current Diagnoses: Patient Active Problem List   Diagnosis Date Noted  . Acute CVA (cerebrovascular accident) ([place]) 01/18/2021  . Leukocytosis 01/17/2021  . Dysarthria   . Acute lower UTI   . Abnormal finding on lung imaging 12/27/2020  . Severe episode of recurrent major depressive disorder, with psychotic features ([place])   . Cerebrovascular accident (CVA) ([place]) 12/06/2019  . AKI (acute kidney injury) ([place]) 12/06/2019  . LVH (left ventricular hypertrophy) 08/10/2019  . Edema of lower extremity 11/26/2018  . Loss of appetite 11/26/2018  . Urinary incontinence 11/26/2018  . Allergic rhinitis 11/14/2018  . Deep venous thrombosis ([place]) 11/14/2018  . Nicotine dependence 11/14/2018  . Hemorrhagic stroke ([place])   . Diastolic dysfunction   . Type 2 diabetes mellitus ([place])   . Anemia of chronic disease   . Chronic obstructive pulmonary disease ([place])   . ICH (intracerebral hemorrhage) ([place]) 08/30/2018  . Hypertensive heart disease without heart failure 07/04/2018  . Nonrheumatic aortic valve insufficiency 07/04/2018  . Esophageal abnormality   . Class 2 severe obesity due to excess calories with serious comorbidity and body mass index (BMI) of 35.0 to 35.9 in adult [facility])   . Peripheral vascular disease ([place])   . History of osteomyelitis    . History of amputation of lesser toe ([place])   . Pulmonary embolism ([place]) 06/22/2018  . Microcytic anemia 06/17/2018  . At risk for adverse drug reaction 06/11/2018  . Critical lower limb ischemia ([place]) 05/27/2018  . GI bleeding 04/02/2018  . Hypokalemia 04/02/2018  . Polysubstance abuse ([place]) 04/02/2018  . Diabetic foot ulcer ([place]) 04/02/2018  . Tinea pedis 03/07/2018  . Diabetic peripheral neuropathy ([place]) 01/24/2018  . Cannabis use disorder, moderate, dependence ([place]) 05/19/2017  . Cocaine use disorder, moderate, dependence ([place]) 05/19/2017  . CKD (chronic kidney disease) stage 3, GFR 30-59 ml/min (HCC) 02/17/2017  . Noncompliance with medications 02/17/2017  . Suicide attempt ([place]) 11/06/2016  . Mild cognitive impairment 03/21/2016  . Moderate episode of recurrent major depressive disorder ([place]) 03/21/2016  . B12 deficiency 03/09/2016  . Iron deficiency anemia 03/07/2016  . History of CVA with residual deficit 12/30/2015  . Schizoaffective disorder, bipolar type ([place]) 10/07/2015  . OSA (obstructive sleep apnea) 06/09/2015  . Vitamin D deficiency 01/06/2015  . Bilateral knee pain 01/05/2015  . Numbness and tingling in right hand 01/05/2015  . Insomnia 12/07/2014  . Right-sided lacunar infarction ([place]) 09/15/2014  . Left hemiparesis ([place]) 09/15/2014  . Dyspnea   . Back pain 09/11/2013  . Psychoactive substance-induced organic mood disorder ([place]) 05/28/2013  . Cocaine abuse with cocaine-induced mood disorder ([place]) 05/28/2013  . Cannabis dependence with cannabis-induced anxiety disorder ([place]) 05/28/2013  . Morbid obesity with BMI of 45.0-49.9, adult ([place]) 04/25/2012  . Chest pain 04/17/2012  . Hypertension 04/17/2012  . DM (diabetes mellitus) with peripheral vascular  complication ([place]) 23/76/2831  . Hyperlipidemia 04/17/2012  . Tobacco use 04/17/2012    Orientation RESPIRATION BLADDER Height & Weight     Self,Time,Situation,Place  Normal Continent Weight:   Height:      BEHAVIORAL SYMPTOMS/MOOD NEUROLOGICAL BOWEL NUTRITION STATUS      Continent Diet  AMBULATORY STATUS COMMUNICATION OF NEEDS Skin   Limited Assist Verbally Normal                       Personal Care Assistance Level of Assistance  Bathing,Feeding,Dressing Bathing Assistance: Limited assistance Feeding assistance: Independent Dressing Assistance: Limited assistance     Functional Limitations Info  Sight,Hearing,Speech Sight Info: Adequate Hearing Info: Adequate Speech Info: Impaired (impaired)    SPECIAL CARE FACTORS FREQUENCY  PT (By licensed PT),OT (By licensed OT)     PT Frequency: min 5x weekly OT Frequency: min 5x weekly            Contractures Contractures Info: Not present    Additional Factors Info  Code Status,Allergies Code Status Info: FULL CODE Allergies Info: Morphine and Related, Metformin   Insulin Sliding Scale Info: see discharge summary       Current Medications (01/20/2021):  This is the current hospital active medication list Current Facility-Administered Medications  Medication Dose Route Frequency Provider Last Rate Last Admin  . acetaminophen (TYLENOL) tablet 650 mg  650 mg Oral Q4H PRN Marcelyn Bruins, MD       Or  . acetaminophen (TYLENOL) 160 MG/5ML solution 650 mg  650 mg Per Tube Q4H PRN Marcelyn Bruins, MD       Or  . acetaminophen (TYLENOL) suppository 650 mg  650 mg Rectal Q4H PRN Marcelyn Bruins, MD      . amLODipine (NORVASC) tablet 10 mg  10 mg Oral Daily Dahal, Marlowe Aschoff, MD   10 mg at 01/20/21 5176  . ARIPiprazole (ABILIFY) tablet 5 mg  5 mg Oral Daily Marcelyn Bruins, MD   5 mg at 01/20/21 0820  . aspirin EC tablet 81 mg  81 mg Oral Daily Marcelyn Bruins, MD   81 mg at 01/20/21 1607  . atorvastatin (LIPITOR) tablet 80 mg  80 mg Oral q1800 Marcelyn Bruins, MD   80 mg at 01/19/21 1737  . cefTRIAXone (ROCEPHIN) 1 g in sodium chloride 0.9 % 100 mL IVPB  1 g Intravenous Q24H Marcelyn Bruins, MD 200 mL/hr  at 01/20/21 0123 1 g at 01/20/21 0123  . cloNIDine (CATAPRES) tablet 0.2 mg  0.2 mg Oral BID Terrilee Croak, MD   0.2 mg at 01/20/21 0820  . enoxaparin (LOVENOX) injection 30 mg  30 mg Subcutaneous Q12H Marcelyn Bruins, MD   30 mg at 01/20/21 0123  . guaiFENesin-dextromethorphan (ROBITUSSIN DM) 100-10 MG/5ML syrup 5 mL  5 mL Oral Q6H PRN Dahal, Marlowe Aschoff, MD   5 mL at 01/18/21 1753  . hydrALAZINE (APRESOLINE) injection 10 mg  10 mg Intravenous Q4H PRN Sharion Settler, NP   10 mg at 01/17/21 2221  . hydrALAZINE (APRESOLINE) tablet 100 mg  100 mg Oral TID Terrilee Croak, MD   100 mg at 01/20/21 0820  . insulin aspart (novoLOG) injection 0-15 Units  0-15 Units Subcutaneous TID WC Marcelyn Bruins, MD   3 Units at 01/20/21 [phone]  . insulin aspart (novoLOG) injection 0-5 Units  0-5 Units Subcutaneous QHS Marcelyn Bruins, MD   3 Units at 01/17/21 [phone]  . insulin glargine (LANTUS) injection 10 Units  10 Units Subcutaneous Daily Terrilee Croak, MD   10 Units at 01/20/21 0819  . lisinopril (ZESTRIL) tablet 40 mg  40 mg Oral Daily Dahal, Marlowe Aschoff, MD   40 mg at 01/20/21 0821  . MEDLINE mouth rinse  15 mL Mouth Rinse BID Terrilee Croak, MD   15 mL at 01/20/21 0905  . metoprolol succinate (TOPROL-XL) 24 hr tablet 50 mg  50 mg Oral q morning Dahal, Marlowe Aschoff, MD   50 mg at 01/20/21 0904  . pantoprazole (PROTONIX) EC tablet 40 mg  40 mg Oral Daily Marcelyn Bruins, MD   40 mg at 01/20/21 4982  . senna-docusate (Senokot-S) tablet 1 tablet  1 tablet Oral QHS PRN Marcelyn Bruins, MD   1 tablet at 01/20/21 6415     Discharge Medications: Please see discharge summary for a list of discharge medications.  Relevant Imaging Results:  Relevant Lab Results:   Additional Information SS # [id number]  Pete Pelt, RN

## 2021-01-20 NOTE — Discharge Summary (Addendum)
Noemi Bellissimo MPN:[id number] DOB: [birth year] DOA: 01/16/2021  PCP: System, Provider Not In  Admit date: 01/16/2021 Discharge date: 01/20/2021  Admitted From: SNF Disposition:  SNF  Recommendations for Outpatient Follow-up:  1. Follow up with PCP in 1 week 2. Please obtain BMP/CBC in one week     Discharge Condition:Stable CODE STATUS:full  Diet recommendation: Regular consistency diet w/ well-CUT meats, moistened foods w/ gravies; Thin liquids VIA CUP - do NOT recommend use of straws for best control. Recommend general aspiration precautions including lingual sweeping to ensure oral clearing b/t bites, alternate w/ small sip of water, small bites/sips slowly. Reflux precautions. NO STRAWS!  Pills WHOLE in puree or ice cream for safer swallowing. Tray setup at meals d/t LUE weakness.   Brief/Interim Summary: Per HPI: Kema Santaella is a 50 y.o. female with medical history significant of recurrent CVA most recently last month, anemia, substance use, COPD, CKD 3A, obesity, DVT, PE, diabetes, hypertension, PVD, OSA, depression who presented with new focal neurologic deficits. Patient is presented from [place] where she was being cared for after discharge for recurrent stroke at the end of April beginning of this month.  She had residual left-sided deficits from that stroke.  She was last seen normal ealier the day of admission. She was diagnosed with COVID 14 days ago and is been in isolation unit.  On admission evening, she was noted to have left facial droop, right-sided numbness, slurred speech, expressive aphasia.Patient's aphasia began to improve on admissonand she is able to answer some questions.  Neurology was consulted. She was found with UTI . Had Ct head and MRI, see report below.She was +covid here. CTA head/neck without acute abn.   Strokelike symptoms - Acute stroke ruled out History of recurrent strokes -CT head and CT angio head and neck were without acute  abnormality. -MRI brain did not show any acute infarction.  neurology consulted-felt Symptoms favored to be recrudescence, significant  Improvement after treatment  with iv abx for UTI.  Continue aspirin and statin    Essential hypertension BP stable Continue home  lisinopril, beta-blockers, hydralazine, amlodipine and clonidine   E. coli UTI Wbc improving. Was receiving IV Rocephin.  Switched to ciprofloxacin po , for  1 more day of treatment, to be given on 5/27.   Recent COVID infection -Patient tested positive for COVID while in the SNF. -Outside the window for precautions at this time.  cxr nml   Mild microcytic anemia Hg stable Continue to monitor as outpatient  CKD 3A Creatinine stable and at baseline needs labs and monitoring as outpatient.  Patient needs to get established with pcp  Type 2 diabetes mellitus Hyperglycemia -A1c 6.6 on 5/3 BG levels mildly elevated, was started on Lantus.  Need to monitor her for this at SNF, to see if needs long term. F/u with pcp for further mx  PVD Hyperlipidemia -Continue home statin  OSA -Continue CPAP  Depression Schizoaffective disorder -Continue home Abilify    Discharge Diagnoses:  Principal Problem:   Cerebrovascular accident (CVA) ([place]) Active Problems:   Hypertension   DM (diabetes mellitus) with peripheral vascular complication (HCC)   Hyperlipidemia   OSA (obstructive sleep apnea)   Schizoaffective disorder, bipolar type (HCC)   Polysubstance abuse (HCC)   Peripheral vascular disease (HCC)   Anemia of chronic disease   CKD (chronic kidney disease) stage 3, GFR 30-59 ml/min (HCC)   History of CVA with residual deficit   Leukocytosis   Dysarthria   Acute  lower UTI   Acute CVA (cerebrovascular accident) [facility])    Discharge Instructions  Discharge Instructions    Call MD for:  temperature >100.4   Complete by: As directed    Diet - low sodium heart healthy   Complete by: As  directed    Dysphagia 3 diet   Increase activity slowly   Complete by: As directed      Allergies as of 01/20/2021      Reactions   Morphine And Related Hives   Metformin Diarrhea, Other (See Comments)   "Allergic," per [facility]      Medication List    STOP taking these medications   amoxicillin-clavulanate 875-125 MG tablet Commonly known as: AUGMENTIN   dexamethasone 6 MG tablet Commonly known as: DECADRON     TAKE these medications   Accu-Chek Guide test strip Generic drug: glucose blood 2 (two) times daily. as directed   Accu-Chek Guide w/Device Kit USE AS DIRECTED TO CHECK BLOOD SUGAR   Accu-Chek Softclix Lancets lancets 2 (two) times daily.   amLODipine 10 MG tablet Commonly known as: NORVASC Take 1 tablet (10 mg total) by mouth daily.   ARIPiprazole 5 MG tablet Commonly known as: ABILIFY Take 5 mg by mouth daily.   aspirin 81 MG EC tablet Take 1 tablet (81 mg total) by mouth daily.   atorvastatin 40 MG tablet Commonly known as: LIPITOR Take 2 tablets (80 mg total) by mouth daily at 6 PM.   ciprofloxacin 250 MG tablet Commonly known as: CIPRO Take 1 tablet (250 mg total) by mouth 2 (two) times daily.   cloNIDine 0.2 MG tablet Commonly known as: CATAPRES Take 0.2 mg by mouth 2 (two) times daily. For 7 days   hydrALAZINE 100 MG tablet Commonly known as: APRESOLINE Take 1 tablet (100 mg total) by mouth 3 (three) times daily.   insulin aspart 100 UNIT/ML injection Commonly known as: novoLOG Inject 0-9 Units into the skin 3 (three) times daily with meals. Correction coverage: Sensitive (thin, NPO, renal) CBG < 70: Implement Hypoglycemia protocol. CBG 70 - 120: 0 units CBG 121 - 150: 1 unit CBG 151 - 200: 2 units CBG 201 - 250: 3 units CBG 251 - 300: 5 units CBG 301 - 350: 7 units CBG 351 - 400: 9 units CBG > 400: call MD.   insulin glargine 100 UNIT/ML injection Commonly known as: LANTUS Inject 0.1 mLs (10 Units total) into the skin  daily. Start taking on: Jan 21, 2021   lisinopril 40 MG tablet Commonly known as: ZESTRIL Take 1 tablet (40 mg total) by mouth daily.   metoprolol succinate 50 MG 24 hr tablet Commonly known as: TOPROL-XL Take 50 mg by mouth every morning.   mirtazapine 15 MG tablet Commonly known as: REMERON Take 1 tablet (15 mg total) by mouth at bedtime. For Depression What changed: additional instructions   pantoprazole 40 MG tablet Commonly known as: PROTONIX Take 1 tablet (40 mg total) by mouth daily.   PEG-3350/Electrolytes 236 g Solr SMARTSIG:Milliliter(s) By Mouth       Allergies  Allergen Reactions  . Morphine And Related Hives  . Metformin Diarrhea and Other (See Comments)    "Allergic," per [facility]    Consultations:  neurology   Procedures/Studies: CT Head Wo Contrast  Result Date: 01/04/2021 CLINICAL DATA:  Hypertension with blurred vision EXAM: CT HEAD WITHOUT CONTRAST TECHNIQUE: Contiguous axial images were obtained from the base of the skull through the vertex without intravenous contrast. COMPARISON:  Head  CT Dec 27, 2020; brain MRI Dec 30, 2020 FINDINGS: Brain: Ventricles and sulci are normal in size and configuration. There is no intracranial mass, hemorrhage, extra-axial fluid collection, or midline shift. There is small vessel disease in the centra semiovale at multiple sites, stable. There is evidence of a prior infarct in the left thalamus. There is evidence of a prior infarct in the mid left cerebellum adjacent to the posterior aspect of the left dentate nucleus. No acute infarct is evident on this study. Vascular: No hyperdense vessel. Foci of calcification noted in each carotid siphon. Skull: The bony calvarium appears intact. Sinuses/Orbits: There is mucosal thickening in several ethmoid air cells as well as in each anterior sphenoid sinus region. Orbits appear symmetric bilaterally. Other: Mastoid air cells are clear. IMPRESSION: Patchy periventricular small vessel  disease, stable. Prior small infarct left thalamus. Prior small infarct left cerebellum immediately lateral to the posterior aspect of the left cerebellar dentate nucleus. No acute infarct evident on this study. No appreciable mass or hemorrhage. There are foci of arterial vascular calcification. There is mucosal thickening in several paranasal sinuses. Electronically Signed   By: Lowella Grip III M.D.   On: 01/04/2021 14:49   CT CHEST W CONTRAST  Result Date: 12/29/2020 CLINICAL DATA:  Lung nodule seen on CT angiography of the head and neck. EXAM: CT CHEST WITH CONTRAST TECHNIQUE: Multidetector CT imaging of the chest was performed during intravenous contrast administration. CONTRAST:  74mL OMNIPAQUE IOHEXOL 300 MG/ML  SOLN COMPARISON:  CT a head neck 12/27/2020, chest radiograph 12/27/2020, CT a chest 06/22/2018 FINDINGS: Cardiovascular: Mild cardiomegaly. No pericardial effusion. Scattered coronary artery calcifications. Pulmonary arteries are normal caliber. No large central filling defects are identified on this non tailored examination of the pulmonary arteries. Atherosclerotic plaque within the normal caliber aorta. No acute luminal abnormality of the imaged aorta. No periaortic stranding or hemorrhage. Shared origin of the brachiocephalic and left common carotid arteries. Minimal calcifications in the proximal great vessels. No major venous abnormalities. Suspect laminar flow mixing artifact in the internal jugular veins. No significant venous reflux. Mediastinum/Nodes: Redemonstration of a patulous, distended thoracic esophagus which contains layering air-fluid levels and ingested material to the level of the thoracic inlet. No acute tracheal abnormality. Thyroid gland and thoracic inlet is unremarkable. No worrisome mediastinal, hilar or axillary adenopathy. Lungs/Pleura: Corresponding well to the opacity seen on the CT angiography of the head neck are several patchy ground-glass and tree-in-bud  opacities present in the right upper lobe in a somewhat branching configuration, favored to reflect some acute infectious or inflammatory process including sequela of aspiration. No other concerning pulmonary nodules or masses. No other consolidative process. No pneumothorax or visible effusion. No convincing CT evidence of edema. Some bandlike opacity in the lung bases, likely atelectasis or scarring. Upper Abdomen: High attenuation enteric contrast media layers dependently within the stomach. No acute abnormalities present in the visualized portions of the upper abdomen. Scattered colonic diverticula without focal inflammation to suggest diverticulitis. Musculoskeletal: Multilevel degenerative changes are present in the imaged portions of the spine. Multilevel flowing anterior osteophytosis, compatible with features of diffuse idiopathic skeletal hyperostosis (DISH). Additional degenerative changes in the bilateral shoulders. Features of right calcific tendinosis/hydroxyapatite deposition. No acute or worrisome osseous abnormalities. Mild dextrocurvature of the spine. No worrisome chest wall mass or lesion. IMPRESSION: There are scattered mixed ground-glass and tree-in-bud nodular opacities predominantly which appear confined to the right upper lobe. Appearance favors an infectious or inflammatory process including possible sequela of aspiration given  a chronically patulous and distended thoracic esophagus containing admixed air and ingested material to the level of the thoracic inlet, which is a similar appearance to comparison CT from 2019. Recommend follow-up noncontrast CT imaging to ensure resolution after therapeutic trial. No other acute intrathoracic process. No other concerning pulmonary nodules or masses. Cardiomegaly.  Coronary artery atherosclerosis. Aortic Atherosclerosis (ICD10-I70.0). Colonic diverticulosis. Multilevel degenerative changes in the spine including features of diffuse idiopathic  skeletal hyperostosis. Suspect some hydroxyapatite deposition/calcific tendinosis of the right shoulder. Electronically Signed   By: Lovena Le M.D.   On: 12/29/2020 03:12   MR ANGIO HEAD WO CONTRAST  Result Date: 12/30/2020 CLINICAL DATA:  Follow-up left hemisphere white matter stroke. EXAM: MRI HEAD WITHOUT CONTRAST MRA HEAD WITHOUT CONTRAST TECHNIQUE: Multiplanar, multiecho pulse sequences of the brain and surrounding structures were obtained without intravenous contrast. Angiographic images of the head were obtained using MRA technique without contrast. COMPARISON:  12/27/2020 FINDINGS: MRI HEAD FINDINGS Brain: Diffusion imaging does not show any progressive change related to 4-5 mm acute infarction of the left centrum semiovale region. Elsewhere, chronic small-vessel ischemic changes are evident throughout the hemispheric white matter. Old small vessel infarctions of the thalami. Old hemorrhagic infarction of the right basal ganglia. No change in those findings since the study of 3 days ago. No hydrocephalus or extra-axial collection. Vascular: Major vessels at the base of the brain show flow. Skull and upper cervical spine: Negative Sinuses/Orbits: Clear/normal Other: None MRA HEAD FINDINGS Both internal carotid arteries are patent through the skull base and siphon region. Some siphon atherosclerotic irregularity. The anterior and middle cerebral vessels are patent. No large or medium vessel occlusion. Moderate distal vessel atherosclerotic irregularity is noted. Motion artifact degrades evaluation of the vertebral arteries. Basilar artery is patent. 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 Posterior circulation branch vessels are patent. Patent bilateral posterior communicating arteries. There is atherosclerotic narrowing and irregularity of the PCA branches with proximal stenosis ease. IMPRESSION: No acute large vessel occlusion. Atherosclerotic irregularity of the distal intracranial branch vessels. 2 mm  basilar tip aneurysm. No change in the 4-5 mm acute infarction of the left posterior centrum semiovale white matter. Extensive chronic ischemic changes elsewhere throughout the brain as previously seen. Electronically Signed   By: Nelson Chimes M.D.   On: 12/30/2020 08:57   MR BRAIN WO CONTRAST  Result Date: 01/17/2021 CLINICAL DATA:  Left-sided deficits EXAM: MRI HEAD WITHOUT CONTRAST TECHNIQUE: Multiplanar, multiecho pulse sequences of the brain and surrounding structures were obtained without intravenous contrast. COMPARISON:  12/30/2020 FINDINGS: Brain: Fading diffusion restriction in the left corona radiata. No new site of infarction. Multiple chronic microhemorrhages in a predominantly central distribution. Hyperintense T2-weighted signal is moderately widespread throughout the white matter. Generalized volume loss without a clear lobar predilection. Old small vessel infarcts of the basal ganglia and left cerebellum. The midline structures are normal. Vascular: Major flow voids are preserved. Skull and upper cervical spine: Normal calvarium and skull base. Visualized upper cervical spine and soft tissues are normal. Sinuses/Orbits:No paranasal sinus fluid levels or advanced mucosal thickening. No mastoid or middle ear effusion. Normal orbits. IMPRESSION: 1. Fading diffusion restriction in the left corona radiata consistent with late subacute to chronic infarct. No new site of infarction or hemorrhage. 2. Multiple chronic microhemorrhages in a predominantly central distribution, consistent with cerebral amyloid angiopathy. Electronically Signed   By: Ulyses Jarred M.D.   On: 01/17/2021 03:24   MR BRAIN WO CONTRAST  Result Date: 12/30/2020 CLINICAL DATA:  Follow-up left hemisphere white matter stroke. EXAM: MRI HEAD WITHOUT CONTRAST MRA HEAD WITHOUT CONTRAST TECHNIQUE: Multiplanar, multiecho pulse sequences of the brain and surrounding structures were obtained without intravenous contrast. Angiographic  images of the head were obtained using MRA technique without contrast. COMPARISON:  12/27/2020 FINDINGS: MRI HEAD FINDINGS Brain: Diffusion imaging does not show any progressive change related to 4-5 mm acute infarction of the left centrum semiovale region. Elsewhere, chronic small-vessel ischemic changes are evident throughout the hemispheric white matter. Old small vessel infarctions of the thalami. Old hemorrhagic infarction of the right basal ganglia. No change in those findings since the study of 3 days ago. No hydrocephalus or extra-axial collection. Vascular: Major vessels at the base of the brain show flow. Skull and upper cervical spine: Negative Sinuses/Orbits: Clear/normal Other: None MRA HEAD FINDINGS Both internal carotid arteries are patent through the skull base and siphon region. Some siphon atherosclerotic irregularity. The anterior and middle cerebral vessels are patent. No large or medium vessel occlusion. Moderate distal vessel atherosclerotic irregularity is noted. Motion artifact degrades evaluation of the vertebral arteries. Basilar artery is patent. 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 Posterior circulation branch vessels are patent. Patent bilateral posterior communicating arteries. There is atherosclerotic narrowing and irregularity of the PCA branches with proximal stenosis ease. IMPRESSION: No acute large vessel occlusion. Atherosclerotic irregularity of the distal intracranial branch vessels. 2 mm basilar tip aneurysm. No change in the 4-5 mm acute infarction of the left posterior centrum semiovale white matter. Extensive chronic ischemic changes elsewhere throughout the brain as previously seen. Electronically Signed   By: Nelson Chimes M.D.   On: 12/30/2020 08:57   MR BRAIN WO CONTRAST  Result Date: 12/27/2020 CLINICAL DATA:  Acute stroke presentation with slurred speech. EXAM: MRI HEAD WITHOUT CONTRAST TECHNIQUE: Multiplanar, multiecho pulse sequences of the brain and surrounding  structures were obtained without intravenous contrast. COMPARISON:  CT studies earlier same day.  MRI 12/06/2019. FINDINGS: Brain: Diffusion imaging shows a 4-5 mm acute infarction within the left upper centrum semiovale region. No other acute infarction. Chronic small-vessel ischemic changes affect pons. Old small vessel cerebellar infarctions. Old small vessel infarctions of the thalami and basal ganglia. Hemosiderin deposition in the right basal ganglia and radiating white matter tracts. Chronic small-vessel ischemic changes of the hemispheric white matter. No large vessel territory infarction. No mass lesion, acute hemorrhage, hydrocephalus or extra-axial collection. Vascular: Major vessels at the base of the brain show flow. Skull and upper cervical spine: Negative Sinuses/Orbits: Clear/normal Other: None IMPRESSION: 4-5 mm acute infarction of the left hemispheric white matter in the upper parietal centrum semiovale region. No large or cortical infarction. Extensive chronic ischemic changes elsewhere throughout the brain as outlined above. Electronically Signed   By: Nelson Chimes M.D.   On: 12/27/2020 20:50   DG Chest Port 1 View  Result Date: 01/17/2021 CLINICAL DATA:  Leukocytosis. EXAM: PORTABLE CHEST 1 VIEW.  Patient is rotated. COMPARISON:  Chest x-ray 01/11/2021, CT chest by 12/29/20 FINDINGS: The heart size and mediastinal contours are unchanged. Aortic calcification. Linear atelectasis versus scarring within the lingula. No focal consolidation. No pulmonary edema. No pleural effusion. No pneumothorax. No acute osseous abnormality. IMPRESSION: No radiographic evidence of acute cardiopulmonary abnormality. Electronically Signed   By: Iven Finn M.D.   On: 01/17/2021 01:57   DG Chest Portable 1 View  Result Date: 01/11/2021 CLINICAL DATA:  Recent diagnosis of COVID with cough. EXAM: PORTABLE CHEST 1 VIEW COMPARISON:  Dec 27, 2020 FINDINGS: Mild  linear atelectasis is seen along the lateral aspect  of the mid left lung. In ill-defined subcentimeter nodular opacity is seen overlying the right lung base. This is not clearly identified on the prior study. There is no evidence of acute infiltrate, pleural effusion or pneumothorax. The cardiac silhouette is borderline in size. The visualized skeletal structures are unremarkable. IMPRESSION: No acute or active cardiopulmonary disease. Electronically Signed   By: Aram Candela M.D.   On: 01/11/2021 17:37   DG Chest Port 1 View  Result Date: 12/27/2020 CLINICAL DATA:  Altered mental status, indistinct focal opacity on neck CT angiogram EXAM: PORTABLE CHEST 1 VIEW COMPARISON:  10/27/2020 chest radiograph. FINDINGS: Stable cardiomediastinal silhouette with mild cardiomegaly. No pneumothorax. No pleural effusion. Indistinct faint opacity in the apical right lung. Otherwise clear lungs with no overt pulmonary edema. IMPRESSION: Stable mild cardiomegaly without overt pulmonary edema. Indistinct faint opacity in the apical right lung, correlating to the site of mixed density opacity on the neck CT angiogram from earlier today. Short-term follow-up dedicated chest CT recommended when clinically feasible. Electronically Signed   By: Delbert Phenix M.D.   On: 12/27/2020 19:11   DG Swallowing Func-Speech Pathology  Result Date: 12/28/2020 Objective Swallowing Evaluation: Type of Study: MBS-Modified Barium Swallow Study  Patient Details Name: Tomiko Schoon MRN: [id number] Date of Birth: [birth year] Today's Date: 12/28/2020 Time: SLP Start Time (ACUTE ONLY): 1346 -SLP Stop Time (ACUTE ONLY): 1400 SLP Time Calculation (min) (ACUTE ONLY): 14 min Past Medical History: Past Medical History: Diagnosis Date . Anxiety  . Chronic lower back pain 04/17/2012  "just got over some; catched when I walked" . COPD (chronic obstructive pulmonary disease) (HCC)  . Critical lower limb ischemia (HCC) 05/27/2018 . Depression  . Headache(784.0) 04/17/2012  "~ qod; lately waking up in am w/one" .  High cholesterol  . Hypertension  . Migraines 04/17/2012 . Obesity  . Sleep apnea   CPAP . Stroke (HCC)  . Type II diabetes mellitus (HCC) 08/28/2002 Past Surgical History: Past Surgical History: Procedure Laterality Date . ABDOMINAL AORTOGRAM W/LOWER EXTREMITY Left 05/27/2018  Procedure: ABDOMINAL AORTOGRAM W/LOWER EXTREMITY Runoff and Possible Intervention;  Surgeon: Maeola Harman, MD;  Location[facility] INVASIVE CV LAB;  Service: Cardiovascular;  Laterality: Left; . APPLICATION OF WOUND VAC Left 06/06/2018  Procedure: APPLICATION OF WOUND VAC;  Surgeon: Maeola Harman, MD;  Location: [facility] OR;  Service: Vascular;  Laterality: Left; . BIOPSY  04/04/2018  Procedure: BIOPSY;  Surgeon: Lynann Bologna, MD;  Location: [facility] ENDOSCOPY;  Service: Endoscopy;; . BIOPSY  10/27/2020  Procedure: BIOPSY;  Surgeon: Willis Modena, MD;  Location: [facility] ENDOSCOPY;  Service: Endoscopy;; . CARDIAC CATHETERIZATION  ~ 2011 . COLONOSCOPY N/A 04/04/2018  Procedure: COLONOSCOPY;  Surgeon: Lynann Bologna, MD;  Location: [facility] ENDOSCOPY;  Service: Endoscopy;  Laterality: N/A; . COLONOSCOPY WITH PROPOFOL N/A 10/27/2020  Procedure: COLONOSCOPY WITH PROPOFOL;  Surgeon: Willis Modena, MD;  Location: [facility] ENDOSCOPY;  Service: Endoscopy;  Laterality: N/A; . ESOPHAGOGASTRODUODENOSCOPY (EGD) WITH PROPOFOL N/A 10/27/2020  Procedure: ESOPHAGOGASTRODUODENOSCOPY (EGD) WITH PROPOFOL  WITH POSSIBLE DIL;  Surgeon: Willis Modena, MD;  Location: [facility] ENDOSCOPY;  Service: Endoscopy;  Laterality: N/A; . LOWER EXTREMITY ANGIOGRAM Left 05/27/2018 . PERIPHERAL VASCULAR INTERVENTION Left 05/27/2018  Procedure: PERIPHERAL VASCULAR INTERVENTION;  Surgeon: Maeola Harman, MD;  Location: [facility] INVASIVE CV LAB;  Service: Cardiovascular;  Laterality: Left; . POLYPECTOMY  04/04/2018  Procedure: POLYPECTOMY;  Surgeon: Lynann Bologna, MD;  Location: [facility] ENDOSCOPY;  Service: Endoscopy;; . POLYPECTOMY  10/27/2020  Procedure: POLYPECTOMY;  Surgeon: Arta Silence, MD;  Location:  [facility] ENDOSCOPY;  Service: Endoscopy;; . TRANSMETATARSAL AMPUTATION Left 05/28/2018  Procedure: AMPUTATION TOES THREE, FOUR AND FIVE on left foot;  Surgeon: Waynetta Sandy, MD;  Location: [place];  Service: Vascular;  Laterality: Left; . WOUND DEBRIDEMENT Left 06/06/2018  Procedure: DEBRIDEMENT WOUND LEFT FOOT;  Surgeon: Waynetta Sandy, MD;  Location: [facility] OR;  Service: Vascular;  Laterality: Left; HPI: Pt is a 50 yo female with a h/o 4 prior strokes presents wtih an acute worsening of BLE pain and dysarthria. MRI showed an acute infarction of the L hemispheric white matter in the upper parietal centrum semiovale region. PMH also includes: DM2, HTN, HLD, smoking, prior cocaine abuse, PE, medication nonadhering. Most recent MBS 12/08/19 revealed aspiration of thin liquids (PAS 7, 8) with recs for Dys 2/Nectar. She reports advancing to regular solids/thin liquids at [facility] after [facility]. Pt also had dysarthria at that time impacting word to short phrase level.  Subjective: eager to eat and drink Assessment / Plan / Recommendation CHL IP CLINICAL IMPRESSIONS 12/28/2020 Clinical Impression Pt presents with a mild oral dysphagia with pharyngeal phase relatively WFL. She has intermittent oral holding with liquids, with mild lingual residue also noted primarily on her first few sips. She also has loss of regular solids to the pyriform sinuses while she is still masticating, but there is no aspiration across all consistencies. Recommend starting a diet consisting of regular solids and thin liquids. Given coughing noted during initial evaluation, will f/u briefly. SLP Visit Diagnosis Dysphagia, oral phase (R13.11) Attention and concentration deficit following -- Frontal lobe and executive function deficit following -- Impact on safety and function Mild aspiration risk   CHL IP TREATMENT RECOMMENDATION 12/28/2020 Treatment Recommendations Therapy as outlined in treatment plan below   Prognosis 12/28/2020 Prognosis for Safe  Diet Advancement Good Barriers to Reach Goals -- Barriers/Prognosis Comment -- CHL IP DIET RECOMMENDATION 12/28/2020 SLP Diet Recommendations Regular solids;Thin liquid Liquid Administration via Cup;Straw Medication Administration Whole meds with liquid Compensations Slow rate;Small sips/bites Postural Changes Seated upright at 90 degrees;Remain semi-upright after after feeds/meals (Comment)   CHL IP OTHER RECOMMENDATIONS 12/28/2020 Recommended Consults -- Oral Care Recommendations Oral care BID Other Recommendations --   CHL IP FOLLOW UP RECOMMENDATIONS 12/28/2020 Follow up Recommendations Inpatient Rehab   CHL IP FREQUENCY AND DURATION 12/28/2020 Speech Therapy Frequency (ACUTE ONLY) min 2x/week Treatment Duration 2 weeks      CHL IP ORAL PHASE 12/28/2020 Oral Phase Impaired Oral - Pudding Teaspoon -- Oral - Pudding Cup -- Oral - Honey Teaspoon -- Oral - Honey Cup -- Oral - Nectar Teaspoon -- Oral - Nectar Cup -- Oral - Nectar Straw -- Oral - Thin Teaspoon -- Oral - Thin Cup Lingual/palatal residue;Holding of bolus Oral - Thin Straw Holding of bolus Oral - Puree WFL Oral - Mech Soft -- Oral - Regular Premature spillage;Impaired mastication Oral - Multi-Consistency -- Oral - Pill WFL Oral Phase - Comment --  CHL IP PHARYNGEAL PHASE 12/28/2020 Pharyngeal Phase WFL Pharyngeal- Pudding Teaspoon -- Pharyngeal -- Pharyngeal- Pudding Cup -- Pharyngeal -- Pharyngeal- Honey Teaspoon -- Pharyngeal -- Pharyngeal- Honey Cup -- Pharyngeal -- Pharyngeal- Nectar Teaspoon -- Pharyngeal -- Pharyngeal- Nectar Cup -- Pharyngeal -- Pharyngeal- Nectar Straw -- Pharyngeal -- Pharyngeal- Thin Teaspoon -- Pharyngeal -- Pharyngeal- Thin Cup -- Pharyngeal -- Pharyngeal- Thin Straw -- Pharyngeal -- Pharyngeal- Puree -- Pharyngeal -- Pharyngeal- Mechanical Soft -- Pharyngeal -- Pharyngeal- Regular -- Pharyngeal -- Pharyngeal- Multi-consistency -- Pharyngeal -- Pharyngeal- Pill --  Pharyngeal -- Pharyngeal Comment --  CHL IP CERVICAL ESOPHAGEAL PHASE  12/28/2020 Cervical Esophageal Phase WFL Pudding Teaspoon -- Pudding Cup -- Honey Teaspoon -- Honey Cup -- Nectar Teaspoon -- Nectar Cup -- Nectar Straw -- Thin Teaspoon -- Thin Cup -- Thin Straw -- Puree -- Mechanical Soft -- Regular -- Multi-consistency -- Pill -- Cervical Esophageal Comment -- Osie Bond., M.A. [facility] Pager [phone] Office [phone] 12/28/2020, 3:02 PM              ECHOCARDIOGRAM COMPLETE  Result Date: 12/28/2020    ECHOCARDIOGRAM REPORT   Patient Name:   STEVE GREGG Date of Exam: 12/28/2020 Medical Rec #:  [id number]         Height:       61.0 in Accession #:    [id number]        Weight:       169.8 lb Date of Birth:  1970-08-30          BSA:          1.762 m Patient Age:    40 years          BP:           199/124 mmHg Patient Gender: F                 HR:           73 bpm. Exam Location:  Inpatient Procedure: 2D Echo Indications:    stroke  History:        Patient has prior history of Echocardiogram examinations, most                 recent 12/07/2019. Risk Factors:Hypertension, Dyslipidemia and                 Diabetes.  Sonographer:    Johny Chess Referring Phys: [place]  1. Left ventricular ejection fraction, by estimation, is 60 to 65%. The left ventricle has normal function. The left ventricle has no regional wall motion abnormalities. There is moderate left ventricular hypertrophy. Left ventricular diastolic parameters are consistent with Grade I diastolic dysfunction (impaired relaxation).  2. Right ventricular systolic function is normal. The right ventricular size is normal.  3. Left atrial size was mildly dilated.  4. The mitral valve is normal in structure. No evidence of mitral valve regurgitation. No evidence of mitral stenosis.  5. Left coronary cusp calcification. The aortic valve is tricuspid. There is mild calcification of the aortic valve. There is mild thickening of the aortic valve. Aortic valve  regurgitation is moderate to severe. No aortic stenosis is present.  6. The inferior vena cava is normal in size with greater than 50% respiratory variability, suggesting right atrial pressure of 3 mmHg. Conclusion(s)/Recommendation(s): No intracardiac source of embolism detected on this transthoracic study. A transesophageal echocardiogram is recommended to exclude cardiac source of embolism if clinically indicated. FINDINGS  Left Ventricle: Left ventricular ejection fraction, by estimation, is 60 to 65%. The left ventricle has normal function. The left ventricle has no regional wall motion abnormalities. The left ventricular internal cavity size was normal in size. There is  moderate left ventricular hypertrophy. Left ventricular diastolic parameters are consistent with Grade I diastolic dysfunction (impaired relaxation). Right Ventricle: The right ventricular size is normal. No increase in right ventricular wall thickness. Right ventricular systolic function is normal. Left Atrium: Left atrial size was mildly dilated. Right Atrium: Right atrial size was normal in size. Pericardium:  There is no evidence of pericardial effusion. Mitral Valve: The mitral valve is normal in structure. No evidence of mitral valve regurgitation. No evidence of mitral valve stenosis. Tricuspid Valve: The tricuspid valve is normal in structure. Tricuspid valve regurgitation is not demonstrated. No evidence of tricuspid stenosis. Aortic Valve: Left coronary cusp calcification. The aortic valve is tricuspid. There is mild calcification of the aortic valve. There is mild thickening of the aortic valve. Aortic valve regurgitation is moderate to severe. Aortic regurgitation PHT measures 489 msec. No aortic stenosis is present. Pulmonic Valve: The pulmonic valve was normal in structure. Pulmonic valve regurgitation is not visualized. No evidence of pulmonic stenosis. Aorta: The aortic root is normal in size and structure. Venous: The inferior  vena cava is normal in size with greater than 50% respiratory variability, suggesting right atrial pressure of 3 mmHg. IAS/Shunts: No atrial level shunt detected by color flow Doppler.  LEFT VENTRICLE PLAX 2D LVIDd:         3.90 cm LVIDs:         3.00 cm LV PW:         1.60 cm LV IVS:        1.30 cm LVOT diam:     2.00 cm LV SV:         75 LV SV Index:   43 LVOT Area:     3.14 cm  LV Volumes (MOD) LV vol d, MOD A2C: 81.6 ml LV vol s, MOD A2C: 34.5 ml LV SV MOD A2C:     47.1 ml RIGHT VENTRICLE             IVC RV S prime:     14.90 cm/s  IVC diam: 1.30 cm TAPSE (M-mode): 2.3 cm LEFT ATRIUM             Index       RIGHT ATRIUM           Index LA diam:        3.00 cm 1.70 cm/m  RA Area:     14.10 cm LA Vol (A2C):   61.3 ml 34.80 ml/m RA Volume:   30.60 ml  17.37 ml/m LA Vol (A4C):   56.4 ml 32.02 ml/m LA Biplane Vol: 59.9 ml 34.00 ml/m  AORTIC VALVE LVOT Vmax:   124.00 cm/s LVOT Vmean:  77.600 cm/s LVOT VTI:    0.239 m AI PHT:      489 msec  AORTA Ao Root diam: 2.60 cm Ao Asc diam:  3.40 cm MV E velocity: 66.70 cm/s MV A velocity: 132.00 cm/s  SHUNTS MV E/A ratio:  0.51         Systemic VTI:  0.24 m                             Systemic Diam: 2.00 cm Candee Furbish MD Electronically signed by Candee Furbish MD Signature Date/Time: 12/28/2020/11:31:32 AM    Final    CT HEAD CODE STROKE WO CONTRAST  Result Date: 01/16/2021 CLINICAL DATA:  Code stroke. Left facial droop and expressive aphasia EXAM: CT HEAD WITHOUT CONTRAST TECHNIQUE: Contiguous axial images were obtained from the base of the skull through the vertex without intravenous contrast. COMPARISON:  None. FINDINGS: Brain: There is no mass, hemorrhage or extra-axial collection. The size and configuration of the ventricles and extra-axial CSF spaces are normal. There is hypoattenuation of the periventricular white matter, most commonly indicating chronic ischemic microangiopathy. Multiple  old small vessel infarcts of the left cerebellum and left thalamus.  Vascular: No abnormal hyperdensity of the major intracranial arteries or dural venous sinuses. No intracranial atherosclerosis. Skull: The visualized skull base, calvarium and extracranial soft tissues are normal. Sinuses/Orbits: No fluid levels or advanced mucosal thickening of the visualized paranasal sinuses. No mastoid or middle ear effusion. The orbits are normal. ASPECTS [facility] Stroke Program Early CT Score) - Ganglionic level infarction (caudate, lentiform nuclei, internal capsule, insula, M1-M3 cortex): 7 - Supraganglionic infarction (M4-M6 cortex): 3 Total score (0-10 with 10 being normal): 10 IMPRESSION: 1. No acute intracranial abnormality. 2. ASPECTS is 10. 3. 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 Advanced chronic ischemic changes for age. These results were called by telephone at the time of interpretation on 01/16/2021 at 11:28 pm to provider [facility] , who verbally acknowledged these results. Electronically Signed   By: Ulyses Jarred M.D.   On: 01/16/2021 23:28   CT HEAD CODE STROKE WO CONTRAST  Result Date: 12/27/2020 CLINICAL DATA:  Code stroke. Acute presentation with slurred speech. EXAM: CT HEAD WITHOUT CONTRAST TECHNIQUE: Contiguous axial images were obtained from the base of the skull through the vertex without intravenous contrast. COMPARISON:  MRI 12/06/2019.  CT 12/06/2019 FINDINGS: Brain: No abnormality seen affecting the brainstem or cerebellum. 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 Chronic small-vessel ischemic changes are present throughout the hemispheric white matter. This appears progressive since last year. No definitely acute stroke. One could not rule out the possibility of a recent small vessel infarction. No sign of large vessel or cortical infarction Vascular: There is atherosclerotic calcification of the major vessels at the base of the brain. Skull: Negative Sinuses/Orbits: Clear/normal Other: None  ASPECTS ([place] Stroke Program Early CT Score) - Ganglionic level infarction (caudate, lentiform nuclei, internal capsule, insula, M1-M3 cortex): 7 - Supraganglionic infarction (M4-M6 cortex): 3 Total score (0-10 with 10 being normal): 10 IMPRESSION: 1. No sign of acute large vessel infarction. Chronic small-vessel ischemic changes throughout the brain, somewhat progressive since April of 2021. Cannot rule out the possibility of a more recent small vessel infarction. 2. ASPECTS is 10 3. These results were communicated to Dr. Cheral Marker at 6:04 pmon 5/2/2022by text page via the [facility] messaging system. Electronically Signed   By: Nelson Chimes M.D.   On: 12/27/2020 18:05   CT ANGIO HEAD CODE STROKE  Result Date: 12/27/2020 CLINICAL DATA:  Acute stroke presentation with slurred speech. EXAM: CT ANGIOGRAPHY HEAD AND NECK TECHNIQUE: Multidetector CT imaging of the head and neck was performed using the standard protocol during bolus administration of intravenous contrast. Multiplanar CT image reconstructions and MIPs were obtained to evaluate the vascular anatomy. Carotid stenosis measurements (when applicable) are obtained utilizing NASCET criteria, using the distal internal carotid diameter as the denominator. CONTRAST:  67mL OMNIPAQUE IOHEXOL 350 MG/ML SOLN COMPARISON:  Head CT same day FINDINGS: CTA NECK FINDINGS Aortic arch: Aortic atherosclerosis. Branching pattern is normal without origin stenosis. Right carotid system: Common carotid artery widely patent to the bifurcation. Soft and calcified plaque at the carotid bifurcation and ICA bulb. No stenosis beyond the diameter of the more distal cervical ICA. Left carotid system: Common carotid artery widely patent to the bifurcation. Mild soft plaque at the carotid bifurcation and ICA bulb but no stenosis beyond the diameter of the more distal cervical ICA. Vertebral arteries: Both vertebral artery origins are widely patent. Both vertebral arteries appear normal  through the cervical region to  the foramen magnum. Skeleton: Ordinary cervical spondylosis. Other neck: No mass or lymphadenopathy. Dilated fluid and air-filled esophagus. Upper chest: Abnormal density in the lateral aspect of the right upper lobe with solid and sub solid components measuring approximately 2.8 cm. This has a worrisome appearance and could possibly be neoplastic or infectious. Review of the MIP images confirms the above findings CTA HEAD FINDINGS Anterior circulation: Both internal carotid arteries are patent through the skull base and siphon regions. Siphon atherosclerotic calcification but without stenosis greater than 30%. The anterior and middle cerebral vessels are patent. No large or medium vessel occlusion is seen. Distal vessel narrowing and atherosclerotic irregularity. Posterior circulation: Both vertebral arteries are patent to the basilar. The basilar is a small vessel. Both posterior cerebral arteries receive most of there supply from the anterior circulation. Distal vessel narrowing and irregularity within the PCA branches. Venous sinuses: Patent and normal. Anatomic variants: None significant. Review of the MIP images confirms the above findings IMPRESSION: No large or medium vessel occlusion. Atherosclerotic plaque at both carotid bifurcations but without stenosis. Intracranial distal branch vessel irregularity. Dilated fluid and air-filled esophagus. 2.8 cm solid and sub solid lesion in the lateral aspect of the right upper lobe. This could be pneumonia or, more concerning, a neoplastic lesion. Complete chest CT suggested when able. This may require further workup. Electronically Signed   By: Nelson Chimes M.D.   On: 12/27/2020 18:27   CT ANGIO NECK CODE STROKE  Result Date: 12/27/2020 CLINICAL DATA:  Acute stroke presentation with slurred speech. EXAM: CT ANGIOGRAPHY HEAD AND NECK TECHNIQUE: Multidetector CT imaging of the head and neck was performed using the standard protocol  during bolus administration of intravenous contrast. Multiplanar CT image reconstructions and MIPs were obtained to evaluate the vascular anatomy. Carotid stenosis measurements (when applicable) are obtained utilizing NASCET criteria, using the distal internal carotid diameter as the denominator. CONTRAST:  42mL OMNIPAQUE IOHEXOL 350 MG/ML SOLN COMPARISON:  Head CT same day FINDINGS: CTA NECK FINDINGS Aortic arch: Aortic atherosclerosis. Branching pattern is normal without origin stenosis. Right carotid system: Common carotid artery widely patent to the bifurcation. Soft and calcified plaque at the carotid bifurcation and ICA bulb. No stenosis beyond the diameter of the more distal cervical ICA. Left carotid system: Common carotid artery widely patent to the bifurcation. Mild soft plaque at the carotid bifurcation and ICA bulb but no stenosis beyond the diameter of the more distal cervical ICA. Vertebral arteries: Both vertebral artery origins are widely patent. Both vertebral arteries appear normal through the cervical region to the foramen magnum. Skeleton: Ordinary cervical spondylosis. Other neck: No mass or lymphadenopathy. Dilated fluid and air-filled esophagus. Upper chest: Abnormal density in the lateral aspect of the right upper lobe with solid and sub solid components measuring approximately 2.8 cm. This has a worrisome appearance and could possibly be neoplastic or infectious. Review of the MIP images confirms the above findings CTA HEAD FINDINGS Anterior circulation: Both internal carotid arteries are patent through the skull base and siphon regions. Siphon atherosclerotic calcification but without stenosis greater than 30%. The anterior and middle cerebral vessels are patent. No large or medium vessel occlusion is seen. Distal vessel narrowing and atherosclerotic irregularity. Posterior circulation: Both vertebral arteries are patent to the basilar. The basilar is a small vessel. Both posterior cerebral  arteries receive most of there supply from the anterior circulation. Distal vessel narrowing and irregularity within the PCA branches. Venous sinuses: Patent and normal. Anatomic variants: None significant. Review of the  MIP images confirms the above findings IMPRESSION: No large or medium vessel occlusion. Atherosclerotic plaque at both carotid bifurcations but without stenosis. Intracranial distal branch vessel irregularity. Dilated fluid and air-filled esophagus. 2.8 cm solid and sub solid lesion in the lateral aspect of the right upper lobe. This could be pneumonia or, more concerning, a neoplastic lesion. Complete chest CT suggested when able. This may require further workup. Electronically Signed   By: Nelson Chimes M.D.   On: 12/27/2020 18:27   CT ANGIO HEAD NECK W WO CM W PERF (CODE STROKE)  Result Date: 01/17/2021 CLINICAL DATA:  Right-sided numbness and slurred speech. Left facial droop. EXAM: CT ANGIOGRAPHY HEAD AND NECK TECHNIQUE: Multidetector CT imaging of the head and neck was performed using the standard protocol during bolus administration of intravenous contrast. Multiplanar CT image reconstructions and MIPs were obtained to evaluate the vascular anatomy. Carotid stenosis measurements (when applicable) are obtained utilizing NASCET criteria, using the distal internal carotid diameter as the denominator. CONTRAST:  176mL OMNIPAQUE IOHEXOL 350 MG/ML SOLN COMPARISON:  None. FINDINGS: CTA NECK FINDINGS SKELETON: There is no bony spinal canal stenosis. No lytic or blastic lesion. OTHER NECK: Normal pharynx, larynx and major salivary glands. No cervical lymphadenopathy. Unremarkable thyroid gland. UPPER CHEST: No pneumothorax or pleural effusion. No nodules or masses. AORTIC ARCH: There is calcific atherosclerosis of the aortic arch. There is no aneurysm, dissection or hemodynamically significant stenosis of the visualized portion of the aorta. Conventional 3 vessel aortic branching pattern. The  visualized proximal subclavian arteries are widely patent. RIGHT CAROTID SYSTEM: Normal without aneurysm, dissection or stenosis. LEFT CAROTID SYSTEM: Normal without aneurysm, dissection or stenosis. VERTEBRAL ARTERIES: Left dominant configuration. Both origins are clearly patent. There is no dissection, occlusion or flow-limiting stenosis to the skull base (V1-V3 segments). CTA HEAD FINDINGS POSTERIOR CIRCULATION: --Vertebral arteries: Normal V4 segments. --Inferior cerebellar arteries: Normal. --Basilar artery: Normal. --Superior cerebellar arteries: Normal. --Posterior cerebral arteries (PCA): Normal. ANTERIOR CIRCULATION: --Intracranial internal carotid arteries: Normal. --Anterior cerebral arteries (ACA): Normal. Both A1 segments are present. Patent anterior communicating artery (a-comm). --Middle cerebral arteries (MCA): Normal. VENOUS SINUSES: As permitted by contrast timing, patent. ANATOMIC VARIANTS: Fetal origins of both posterior cerebral arteries. Review of the MIP images confirms the above findings. IMPRESSION: 1. No emergent large vessel occlusion or high-grade stenosis of the intracranial arteries. Aortic Atherosclerosis (ICD10-I70.0). Electronically Signed   By: Ulyses Jarred M.D.   On: 01/17/2021 00:09   DG ESOPHAGUS W SINGLE CM (SOL OR THIN BA)  Result Date: 12/30/2020 CLINICAL DATA:  Soft ill dysphagia. Feels like food gets stuck in her throat and has trouble burping since her stroke last year EXAM: ESOPHOGRAM/BARIUM SWALLOW TECHNIQUE: Single contrast examination was performed using  thin barium. FLUOROSCOPY TIME:  Fluoroscopy Time:  3 minutes 12 seconds Radiation Exposure Index (if provided by the fluoroscopic device): 49.2 mGy Number of Acquired Spot Images: 7 COMPARISON:  Chest CT 12/29/2020 FINDINGS: Thoracic esophagram was performed utilizing thin barium only in the LPO position due to patient condition. There is severe esophageal dysmotility with impaired relaxation of the lower  esophageal sphincter not coordinated with esophageal contractions. There is stasis of barium in the esophagus and esophageal dilation. There is distal segment narrowing of approximately 3.5 cm. IMPRESSION: Distal esophageal narrowing with segment measuring approximately 3.0 cm, with upstream esophageal dilation, and esophageal dysmotility with contractions not in coordination with the lower esophageal sphincter. Findings are compatible with a diagnosis of achalasia. Recommend gastroenterology referral/consultation. Electronically Signed   By: Edison Nasuti  Chancy Milroy   On: 12/30/2020 11:40       Subjective: Has no complaints. Asking about getting a shower  Discharge Exam: Vitals:   01/20/21 0001 01/20/21 0511  BP: 140/78 138/90  Pulse: 89 78  Resp: 18 18  Temp: 98.5 F (36.9 C) 98.3 F (36.8 C)  SpO2: 100% 100%   Vitals:   01/19/21 1555 01/19/21 2022 01/20/21 0001 01/20/21 0511  BP: 126/83 140/83 140/78 138/90  Pulse: 85 93 89 78  Resp: (!) $RemoveB'22 17 18 18  '[id number]$ Temp: 98.4 F (36.9 C) 98.7 F (37.1 C) 98.5 F (36.9 C) 98.3 F (36.8 C)  TempSrc: Oral Oral Oral Oral  SpO2: 98% 100% 100% 100%    General: Pt is alert, awake, not in acute distress Cardiovascular: RRR, S1/S2 +, no rubs, no gallops Respiratory: CTA bilaterally, no wheezing, no rhonchi Abdominal: Soft, NT, ND, bowel sounds + Extremities: no edema    The results of significant diagnostics from this hospitalization (including imaging, microbiology, ancillary and laboratory) are listed below for reference.     Microbiology: Recent Results (from the past 240 hour(s))  Resp Panel by RT-PCR (Flu A&B, Covid) Nasopharyngeal Swab     Status: Abnormal   Collection Time: 01/16/21 11:16 PM   Specimen: Nasopharyngeal Swab; Nasopharyngeal(NP) swabs in vial transport medium  Result Value Ref Range Status   SARS Coronavirus 2 by RT PCR POSITIVE (A) NEGATIVE Final    Comment: RESULT CALLED TO, READ BACK BY AND VERIFIED WITH: KYLIE SANDERS  $Remove'@0029'jYqRphR$  01/17/21 LFD (NOTE) SARS-CoV-2 target nucleic acids are DETECTED.  The SARS-CoV-2 RNA is generally detectable in upper respiratory specimens during the acute phase of infection. Positive results are indicative of the presence of the identified virus, but do not rule out bacterial infection or co-infection with other pathogens not detected by the test. Clinical correlation with patient history and other diagnostic information is necessary to determine patient infection status. The expected result is Negative.  Fact Sheet for Patients: EntrepreneurPulse.com.au  Fact Sheet for Healthcare Providers: IncredibleEmployment.be  This test is not yet approved or cleared by the [place] FDA and  has been authorized for detection and/or diagnosis of SARS-CoV-2 by FDA under an Emergency Use Authorization (EUA).  This EUA will remain in effect (meaning this test can be  used) for the duration of  the COVID-19 declaration under Section 564(b)(1) of the Act, 21 U.S.C. section 360bbb-3(b)(1), unless the authorization is terminated or revoked sooner.     Influenza A by PCR NEGATIVE NEGATIVE Final   Influenza B by PCR NEGATIVE NEGATIVE Final    Comment: (NOTE) The Xpert Xpress SARS-CoV-2/FLU/RSV plus assay is intended as an aid in the diagnosis of influenza from Nasopharyngeal swab specimens and should not be used as a sole basis for treatment. Nasal washings and aspirates are unacceptable for Xpert Xpress SARS-CoV-2/FLU/RSV testing.  Fact Sheet for Patients: EntrepreneurPulse.com.au  Fact Sheet for Healthcare Providers: IncredibleEmployment.be  This test is not yet approved or cleared by the [place] FDA and has been authorized for detection and/or diagnosis of SARS-CoV-2 by FDA under an Emergency Use Authorization (EUA). This EUA will remain in effect (meaning this test can be used) for the duration of  the COVID-19 declaration under Section 564(b)(1) of the Act, 21 U.S.C. section 360bbb-3(b)(1), unless the authorization is terminated or revoked.  Performed at [facility], [address], [place], [place] [address]   Urine Culture     Status: Abnormal   Collection Time: 01/16/21 11:16 PM  Specimen: Urine, Random  Result Value Ref Range Status   Specimen Description   Final    URINE, RANDOM Performed at [facility], [address], [place], [place] [address]    Special Requests   Final    can add on previously collected urine. Performed at [facility], [address] [place], [place] [address]    Culture >=100,000 COLONIES/mL ESCHERICHIA COLI (A)  Final   Report Status 01/20/2021 FINAL  Final   Organism ID, Bacteria ESCHERICHIA COLI (A)  Final      Susceptibility   Escherichia coli - MIC*    AMPICILLIN >=32 RESISTANT Resistant     CEFAZOLIN >=64 RESISTANT Resistant     CEFEPIME <=0.12 SENSITIVE Sensitive     CEFTRIAXONE 1 SENSITIVE Sensitive     CIPROFLOXACIN <=0.25 SENSITIVE Sensitive     GENTAMICIN <=1 SENSITIVE Sensitive     IMIPENEM <=0.25 SENSITIVE Sensitive     NITROFURANTOIN <=16 SENSITIVE Sensitive     TRIMETH/SULFA <=20 SENSITIVE Sensitive     AMPICILLIN/SULBACTAM >=32 RESISTANT Resistant     PIP/TAZO 8 SENSITIVE Sensitive     * >=100,000 COLONIES/mL ESCHERICHIA COLI  MRSA PCR Screening     Status: None   Collection Time: 01/17/21  7:37 AM   Specimen: Nasopharyngeal  Result Value Ref Range Status   MRSA by PCR NEGATIVE NEGATIVE Final    Comment:        The GeneXpert MRSA Assay (FDA approved for NASAL specimens only), is one component of a comprehensive MRSA colonization surveillance program. It is not intended to diagnose MRSA infection nor to guide or monitor treatment for MRSA infections. Performed at [facility], [address] [place], [place] [address]      Labs: BNP (last 3 results) No results for  input(s): BNP in the last 8760 hours. Basic Metabolic Panel: Recent Labs  Lab 01/16/21 2316 01/18/21 0504 01/19/21 0402 01/20/21 0438  NA 135 136 139 138  K 4.3 4.0 4.0 4.2  CL 100 101 103 104  CO2 $Re'24 27 28 25  'XdS$ GLUCOSE 366* 140* 203* 237*  BUN 33* 31* 35* 38*  CREATININE 1.17* 0.85 1.15* 1.23*  CALCIUM 9.3 9.0 8.7* 8.7*  MG  --  2.0  --   --   PHOS  --  3.1  --   --    Liver Function Tests: Recent Labs  Lab 01/16/21 2316  AST 22  ALT 23  ALKPHOS 56  BILITOT 0.6  PROT 7.5  ALBUMIN 3.3*   No results for input(s): LIPASE, AMYLASE in the last 168 hours. No results for input(s): AMMONIA in the last 168 hours. CBC: Recent Labs  Lab 01/16/21 2316 01/18/21 0504 01/19/21 0402 01/20/21 0438  WBC 13.4* 18.8* 16.5* 14.0*  NEUTROABS 9.2* 7.4 5.8 5.6  HGB 10.5* 10.6* 10.0* 9.6*  HCT 32.6* 32.4* 30.0* 28.7*  MCV 71.5* 70.0* 70.3* 70.2*  PLT 388 391 379 329   Cardiac Enzymes: No results for input(s): CKTOTAL, CKMB, CKMBINDEX, TROPONINI in the last 168 hours. BNP: Invalid input(s): POCBNP CBG: Recent Labs  Lab 01/19/21 0746 01/19/21 1117 01/19/21 1546 01/19/21 2113 01/20/21 0723  GLUCAP 159* 135* 144* 182* 165*   D-Dimer No results for input(s): DDIMER in the last 72 hours. Hgb A1c No results for input(s): HGBA1C in the last 72 hours. Lipid Profile No results for input(s): CHOL, HDL, LDLCALC, TRIG, CHOLHDL, LDLDIRECT in the last 72 hours. Thyroid function studies No results for input(s): TSH, T4TOTAL, T3FREE, THYROIDAB  in the last 72 hours.  Invalid input(s): FREET3 Anemia work up No results for input(s): VITAMINB12, FOLATE, FERRITIN, TIBC, IRON, RETICCTPCT in the last 72 hours. Urinalysis    Component Value Date/Time   COLORURINE YELLOW (A) 01/16/2021 2316   APPEARANCEUR CLEAR (A) 01/16/2021 2316   LABSPEC 1.022 01/16/2021 2316   PHURINE 6.0 01/16/2021 2316   GLUCOSEU >=500 (A) 01/16/2021 2316   HGBUR NEGATIVE 01/16/2021 2316   BILIRUBINUR NEGATIVE  01/16/2021 2316   KETONESUR NEGATIVE 01/16/2021 2316   PROTEINUR NEGATIVE 01/16/2021 2316   UROBILINOGEN 0.2 08/28/2014 1143   NITRITE POSITIVE (A) 01/16/2021 2316   LEUKOCYTESUR TRACE (A) 01/16/2021 2316   Sepsis Labs Invalid input(s): PROCALCITONIN,  WBC,  LACTICIDVEN Microbiology Recent Results (from the past 240 hour(s))  Resp Panel by RT-PCR (Flu A&B, Covid) Nasopharyngeal Swab     Status: Abnormal   Collection Time: 01/16/21 11:16 PM   Specimen: Nasopharyngeal Swab; Nasopharyngeal(NP) swabs in vial transport medium  Result Value Ref Range Status   SARS Coronavirus 2 by RT PCR POSITIVE (A) NEGATIVE Final    Comment: RESULT CALLED TO, READ BACK BY AND VERIFIED WITH: KYLIE SANDERS $[email or web address]$  01/17/21 LFD (NOTE) SARS-CoV-2 target nucleic acids are DETECTED.  The SARS-CoV-2 RNA is generally detectable in upper respiratory specimens during the acute phase of infection. Positive results are indicative of the presence of the identified virus, but do not rule out bacterial infection or co-infection with other pathogens not detected by the test. Clinical correlation with patient history and other diagnostic information is necessary to determine patient infection status. The expected result is Negative.  Fact Sheet for Patients: EntrepreneurPulse.com.au  Fact Sheet for Healthcare Providers: IncredibleEmployment.be  This test is not yet approved or cleared by the [place] FDA and  has been authorized for detection and/or diagnosis of SARS-CoV-2 by FDA under an Emergency Use Authorization (EUA).  This EUA will remain in effect (meaning this test can be  used) for the duration of  the COVID-19 declaration under Section 564(b)(1) of the Act, 21 U.S.C. section 360bbb-3(b)(1), unless the authorization is terminated or revoked sooner.     Influenza A by PCR NEGATIVE NEGATIVE Final   Influenza B by PCR NEGATIVE NEGATIVE Final    Comment: (NOTE) The  Xpert Xpress SARS-CoV-2/FLU/RSV plus assay is intended as an aid in the diagnosis of influenza from Nasopharyngeal swab specimens and should not be used as a sole basis for treatment. Nasal washings and aspirates are unacceptable for Xpert Xpress SARS-CoV-2/FLU/RSV testing.  Fact Sheet for Patients: EntrepreneurPulse.com.au  Fact Sheet for Healthcare Providers: IncredibleEmployment.be  This test is not yet approved or cleared by the [place] FDA and has been authorized for detection and/or diagnosis of SARS-CoV-2 by FDA under an Emergency Use Authorization (EUA). This EUA will remain in effect (meaning this test can be used) for the duration of the COVID-19 declaration under Section 564(b)(1) of the Act, 21 U.S.C. section 360bbb-3(b)(1), unless the authorization is terminated or revoked.  Performed at [facility], [address] [place], [place] [address]   Urine Culture     Status: Abnormal   Collection Time: 01/16/21 11:16 PM   Specimen: Urine, Random  Result Value Ref Range Status   Specimen Description   Final    URINE, RANDOM Performed at [facility], [address], [place], [place] [address]    Special Requests   Final    can add on previously collected urine. Performed at [facility], [address],  [place], [place] [address]    Culture >=100,000 COLONIES/mL ESCHERICHIA COLI (A)  Final   Report Status 01/20/2021 FINAL  Final   Organism ID, Bacteria ESCHERICHIA COLI (A)  Final      Susceptibility   Escherichia coli - MIC*    AMPICILLIN >=32 RESISTANT Resistant     CEFAZOLIN >=64 RESISTANT Resistant     CEFEPIME <=0.12 SENSITIVE Sensitive     CEFTRIAXONE 1 SENSITIVE Sensitive     CIPROFLOXACIN <=0.25 SENSITIVE Sensitive     GENTAMICIN <=1 SENSITIVE Sensitive     IMIPENEM <=0.25 SENSITIVE Sensitive     NITROFURANTOIN <=16 SENSITIVE Sensitive     TRIMETH/SULFA <=20 SENSITIVE Sensitive      AMPICILLIN/SULBACTAM >=32 RESISTANT Resistant     PIP/TAZO 8 SENSITIVE Sensitive     * >=100,000 COLONIES/mL ESCHERICHIA COLI  MRSA PCR Screening     Status: None   Collection Time: 01/17/21  7:37 AM   Specimen: Nasopharyngeal  Result Value Ref Range Status   MRSA by PCR NEGATIVE NEGATIVE Final    Comment:        The GeneXpert MRSA Assay (FDA approved for NASAL specimens only), is one component of a comprehensive MRSA colonization surveillance program. It is not intended to diagnose MRSA infection nor to guide or monitor treatment for MRSA infections. Performed at [facility], [address], [place], [place] [address]      Time coordinating discharge: Over 30 minutes  SIGNED:   Nolberto Hanlon, MD  [facility] 01/20/2021, 9:40 AM Pager   If 7PM-7AM, please contact night-coverage [email or web address] Password [email or web address]

## 2021-01-20 NOTE — NC FL2 (Signed)
[place] LEVEL OF CARE SCREENING TOOL     IDENTIFICATION  [name] Birthdate: 1971-07-24 Sex: female Admission Date (Current Location): 01/16/2021  [place] and [place] Number:  Engineering geologist and Address:  [facility], [address], [place], [place] [address]      Provider Number: [id number]  Attending Physician Name and Address:  Nolberto Hanlon, MD  Relative Name and Phone Number:       Current Level of Care: Hospital Recommended Level of Care: [place] Prior Approval Number:    Date Approved/Denied:   PASRR Number: [id number]  Discharge Plan: SNF    Current Diagnoses: Patient Active Problem List   Diagnosis Date Noted  . Acute CVA (cerebrovascular accident) ([place]) 01/18/2021  . Leukocytosis 01/17/2021  . Dysarthria   . Acute lower UTI   . Abnormal finding on lung imaging 12/27/2020  . Severe episode of recurrent major depressive disorder, with psychotic features ([place])   . Cerebrovascular accident (CVA) ([place]) 12/06/2019  . AKI (acute kidney injury) ([place]) 12/06/2019  . LVH (left ventricular hypertrophy) 08/10/2019  . Edema of lower extremity 11/26/2018  . Loss of appetite 11/26/2018  . Urinary incontinence 11/26/2018  . Allergic rhinitis 11/14/2018  . Deep venous thrombosis ([place]) 11/14/2018  . Nicotine dependence 11/14/2018  . Hemorrhagic stroke ([place])   . Diastolic dysfunction   . Type 2 diabetes mellitus ([place])   . Anemia of chronic disease   . Chronic obstructive pulmonary disease ([place])   . ICH (intracerebral hemorrhage) ([place]) 08/30/2018  . Hypertensive heart disease without heart failure 07/04/2018  . Nonrheumatic aortic valve insufficiency 07/04/2018  . Esophageal abnormality   . Class 2 severe obesity due to excess calories with serious comorbidity and body mass index (BMI) of 35.0 to 35.9 in adult [facility])   . Peripheral vascular disease ([place])   . History of osteomyelitis    . History of amputation of lesser toe ([place])   . Pulmonary embolism ([place]) 06/22/2018  . Microcytic anemia 06/17/2018  . At risk for adverse drug reaction 06/11/2018  . Critical lower limb ischemia ([place]) 05/27/2018  . GI bleeding 04/02/2018  . Hypokalemia 04/02/2018  . Polysubstance abuse ([place]) 04/02/2018  . Diabetic foot ulcer ([place]) 04/02/2018  . Tinea pedis 03/07/2018  . Diabetic peripheral neuropathy ([place]) 01/24/2018  . Cannabis use disorder, moderate, dependence ([place]) 05/19/2017  . Cocaine use disorder, moderate, dependence ([place]) 05/19/2017  . CKD (chronic kidney disease) stage 3, GFR 30-59 ml/min (HCC) 02/17/2017  . Noncompliance with medications 02/17/2017  . Suicide attempt ([place]) 11/06/2016  . Mild cognitive impairment 03/21/2016  . Moderate episode of recurrent major depressive disorder ([place]) 03/21/2016  . B12 deficiency 03/09/2016  . Iron deficiency anemia 03/07/2016  . History of CVA with residual deficit 12/30/2015  . Schizoaffective disorder, bipolar type ([place]) 10/07/2015  . OSA (obstructive sleep apnea) 06/09/2015  . Vitamin D deficiency 01/06/2015  . Bilateral knee pain 01/05/2015  . Numbness and tingling in right hand 01/05/2015  . Insomnia 12/07/2014  . Right-sided lacunar infarction ([place]) 09/15/2014  . Left hemiparesis ([place]) 09/15/2014  . Dyspnea   . Back pain 09/11/2013  . Psychoactive substance-induced organic mood disorder ([place]) 05/28/2013  . Cocaine abuse with cocaine-induced mood disorder ([place]) 05/28/2013  . Cannabis dependence with cannabis-induced anxiety disorder ([place]) 05/28/2013  . Morbid obesity with BMI of 45.0-49.9, adult ([place]) 04/25/2012  . Chest pain 04/17/2012  . Hypertension 04/17/2012  . DM (diabetes mellitus) with peripheral vascular  complication ([place]) 23/76/2831  . Hyperlipidemia 04/17/2012  . Tobacco use 04/17/2012    Orientation RESPIRATION BLADDER Height & Weight     Self,Time,Situation,Place  Normal Continent Weight:   Height:      BEHAVIORAL SYMPTOMS/MOOD NEUROLOGICAL BOWEL NUTRITION STATUS      Continent Diet  AMBULATORY STATUS COMMUNICATION OF NEEDS Skin   Limited Assist Verbally Normal                       Personal Care Assistance Level of Assistance  Bathing,Feeding,Dressing Bathing Assistance: Limited assistance Feeding assistance: Independent Dressing Assistance: Limited assistance     Functional Limitations Info  Sight,Hearing,Speech Sight Info: Adequate Hearing Info: Adequate Speech Info: Impaired (impaired)    SPECIAL CARE FACTORS FREQUENCY  PT (By licensed PT),OT (By licensed OT)     PT Frequency: min 5x weekly OT Frequency: min 5x weekly            Contractures Contractures Info: Not present    Additional Factors Info  Code Status,Allergies Code Status Info: FULL CODE Allergies Info: Morphine and Related, Metformin   Insulin Sliding Scale Info: see discharge summary       Current Medications (01/20/2021):  This is the current hospital active medication list Current Facility-Administered Medications  Medication Dose Route Frequency Provider Last Rate Last Admin  . acetaminophen (TYLENOL) tablet 650 mg  650 mg Oral Q4H PRN Marcelyn Bruins, MD       Or  . acetaminophen (TYLENOL) 160 MG/5ML solution 650 mg  650 mg Per Tube Q4H PRN Marcelyn Bruins, MD       Or  . acetaminophen (TYLENOL) suppository 650 mg  650 mg Rectal Q4H PRN Marcelyn Bruins, MD      . amLODipine (NORVASC) tablet 10 mg  10 mg Oral Daily Dahal, Marlowe Aschoff, MD   10 mg at 01/20/21 5176  . ARIPiprazole (ABILIFY) tablet 5 mg  5 mg Oral Daily Marcelyn Bruins, MD   5 mg at 01/20/21 0820  . aspirin EC tablet 81 mg  81 mg Oral Daily Marcelyn Bruins, MD   81 mg at 01/20/21 1607  . atorvastatin (LIPITOR) tablet 80 mg  80 mg Oral q1800 Marcelyn Bruins, MD   80 mg at 01/19/21 1737  . cefTRIAXone (ROCEPHIN) 1 g in sodium chloride 0.9 % 100 mL IVPB  1 g Intravenous Q24H Marcelyn Bruins, MD 200 mL/hr  at 01/20/21 0123 1 g at 01/20/21 0123  . cloNIDine (CATAPRES) tablet 0.2 mg  0.2 mg Oral BID Terrilee Croak, MD   0.2 mg at 01/20/21 0820  . enoxaparin (LOVENOX) injection 30 mg  30 mg Subcutaneous Q12H Marcelyn Bruins, MD   30 mg at 01/20/21 0123  . guaiFENesin-dextromethorphan (ROBITUSSIN DM) 100-10 MG/5ML syrup 5 mL  5 mL Oral Q6H PRN Dahal, Marlowe Aschoff, MD   5 mL at 01/18/21 1753  . hydrALAZINE (APRESOLINE) injection 10 mg  10 mg Intravenous Q4H PRN Sharion Settler, NP   10 mg at 01/17/21 2221  . hydrALAZINE (APRESOLINE) tablet 100 mg  100 mg Oral TID Terrilee Croak, MD   100 mg at 01/20/21 0820  . insulin aspart (novoLOG) injection 0-15 Units  0-15 Units Subcutaneous TID WC Marcelyn Bruins, MD   3 Units at 01/20/21 [phone]  . insulin aspart (novoLOG) injection 0-5 Units  0-5 Units Subcutaneous QHS Marcelyn Bruins, MD   3 Units at 01/17/21 [phone]  . insulin glargine (LANTUS) injection 10 Units  10 Units Subcutaneous Daily Terrilee Croak, MD   10 Units at 01/20/21 0819  . lisinopril (ZESTRIL) tablet 40 mg  40 mg Oral Daily Dahal, Marlowe Aschoff, MD   40 mg at 01/20/21 0821  . MEDLINE mouth rinse  15 mL Mouth Rinse BID Terrilee Croak, MD   15 mL at 01/20/21 0905  . metoprolol succinate (TOPROL-XL) 24 hr tablet 50 mg  50 mg Oral q morning Dahal, Marlowe Aschoff, MD   50 mg at 01/20/21 0904  . pantoprazole (PROTONIX) EC tablet 40 mg  40 mg Oral Daily Marcelyn Bruins, MD   40 mg at 01/20/21 5643  . senna-docusate (Senokot-S) tablet 1 tablet  1 tablet Oral QHS PRN Marcelyn Bruins, MD   1 tablet at 01/20/21 3295     Discharge Medications: Please see discharge summary for a list of discharge medications.  Relevant Imaging Results:  Relevant Lab Results:   Additional Information SS # [id number]  Patient COVID positive, facility willing to allow patient to return per [place], admissions coord  Pete Pelt, RN

## 2021-01-20 NOTE — TOC Progression Note (Addendum)
Transition of Care [facility]) - Progression Note    Patient Details  Name: Diamond Rice MRN: [id number] Date of Birth: 03-03-1971  Transition of Care [facility]) CM/SW [place], RN Phone Number: 01/20/2021, 9:22 AM  Clinical Narrative:   Per Kenney Houseman at [facility], patient can return to [facility] today, awaiting room number.           Expected Discharge Plan and Services                                                 Social Determinants of Health (SDOH) Interventions    Readmission Risk Interventions Readmission Risk Prevention Plan 01/20/2021  Transportation Screening (No Data)  Medication Review Press photographer) (No Data)  PCP or Specialist appointment within 3-5 days of discharge (No Data)  [place] or [place] (No Data)  SW Recovery Care/Counseling Consult (No Data)  Palliative Care Screening Not Applicable  Skilled Nursing Facility Complete  Some recent data might be hidden

## 2021-01-20 NOTE — TOC Transition Note (Signed)
Transition of Care [facility]) - CM/SW Discharge Note   Patient Details  Name: Diamond Rice MRN: [id number] Date of Birth: 12/07/1970  Transition of Care [facility]) CM/SW Contact:  Pete Pelt, RN Phone Number: 01/20/2021, 1:32 PM   Clinical Narrative:   Patient is being discharged today.  Consents to going to [facility] care.  Verified with Tonya at [facility], going to room 61B.  Aware patient is COVID positive.  Patient removed from isolation here due to length of treatment time.  [facility] will pick patient up at 4pm.  Patient notified.          Patient Goals and CMS Choice        Discharge Placement                       Discharge Plan and Services                                     Social Determinants of Health (SDOH) Interventions     Readmission Risk Interventions Readmission Risk Prevention Plan 01/20/2021  Transportation Screening (No Data)  Medication Review Press photographer) (No Data)  PCP or Specialist appointment within 3-5 days of discharge (No Data)  [place] or [place] (No Data)  SW Recovery Care/Counseling Consult (No Data)  Palliative Care Screening Not Applicable  Skilled Nursing Facility Complete  Some recent data might be hidden

## 2021-01-20 NOTE — Plan of Care (Signed)

## 2021-02-16 ENCOUNTER — Ambulatory Visit: Payer: Medicare Other | Admitting: Cardiology

## 2021-02-23 NOTE — Progress Notes (Deleted)
Cardiology Office Note   Date:  02/23/2021   ID:  Diamond Rice, DOB [birth year], MRN [id number]  PCP:  System, Provider Not In  Cardiologist:   Minus Breeding, MD Referring:  ***  No chief complaint on file.     History of Present Illness: Diamond Rice is a 50 y.o. female who presents for evaluation of LVH.  The patient was in the hospital with pulmonary embolism following toe amputation.  Her EF is found to be 65 to 70%.  She is noted to have mild to moderate aortic insufficiency.  There was a mention in her notes of having a previous MI but I do not see a record of this.  I do see that she had a previous perfusion study in 2013 without ischemia or infarct.   She reports that there was a heart catheterization some years ago when I see mention in 08/2009.  She said there was some small vessel disease.  Other than that there is not been any significant cardiac history.  She is had difficult to control hypertension with a strong family history of this.  She had a stroke with left-sided weakness.    She was hospitalized at [facility] from 5/2 - 01/03/2021 for bilateral leg pain and dysarthria in the setting of marked hypertension.  It was felt that her recurrent stroke was secondary to small vessel ischemic disease in the setting of uncontrolled hypertension.  Brain MRI showed a 4 to 5 mm area of acute infarction in the left hemispheric white matter of the upper parietal centrum ovale region.  During the hospitalization, she complained of dysphagia and underwent EGD with esophageal dilation and Botox injection for achalasia.      ***   Past Medical History:  Diagnosis Date   Acute ischemic stroke ([place]) 12/27/2020   Acute renal failure (ARF) ([place]) 04/02/2018   Anxiety    Chronic lower back pain 04/17/2012   "just got over some; catched when I walked"   COPD (chronic obstructive pulmonary disease) ([place])    Critical lower limb ischemia ([place]) 05/27/2018   Depression    Headache(784.0)  04/17/2012   "~ qod; lately waking up in am w/one"   High cholesterol    Hypertension    Migraines 04/17/2012   Obesity    Sleep apnea    CPAP   Stroke ([place])    Type II diabetes mellitus ([place]) 08/28/2002    Past Surgical History:  Procedure Laterality Date   ABDOMINAL AORTOGRAM W/LOWER EXTREMITY Left 05/27/2018   Procedure: ABDOMINAL AORTOGRAM W/LOWER EXTREMITY Runoff and Possible Intervention;  Surgeon: Waynetta Sandy, MD;  Location: [place] [facility];  Service: Cardiovascular;  Laterality: Left;   APPLICATION OF WOUND VAC Left 06/06/2018   Procedure: APPLICATION OF WOUND VAC;  Surgeon: Waynetta Sandy, MD;  Location: [place];  Service: Vascular;  Laterality: Left;   BALLOON DILATION N/A 12/31/2020   Procedure: [place];  Surgeon: Ronnette Juniper, MD;  Location: [place];  Service: Gastroenterology;  Laterality: N/A;   BIOPSY  04/04/2018   Procedure: BIOPSY;  Surgeon: [name], MD;  Location: [facility] ENDOSCOPY;  Service: Endoscopy;;   BIOPSY  10/27/2020   Procedure: BIOPSY;  Surgeon: Arta Silence, MD;  Location: [facility] ENDOSCOPY;  Service: Endoscopy;;   BOTOX INJECTION N/A 12/31/2020   Procedure: BOTOX INJECTION;  Surgeon: Ronnette Juniper, MD;  Location: [place];  Service: Gastroenterology;  Laterality: N/A;   CARDIAC CATHETERIZATION  ~ 2011   COLONOSCOPY N/A  04/04/2018   Procedure: COLONOSCOPY;  Surgeon: [name], MD;  Location: [facility] ENDOSCOPY;  Service: Endoscopy;  Laterality: N/A;   COLONOSCOPY WITH PROPOFOL N/A 10/27/2020   Procedure: COLONOSCOPY WITH PROPOFOL;  Surgeon: Arta Silence, MD;  Location: [facility] ENDOSCOPY;  Service: Endoscopy;  Laterality: N/A;   ESOPHAGOGASTRODUODENOSCOPY N/A 12/31/2020   Procedure: ESOPHAGOGASTRODUODENOSCOPY (EGD);  Surgeon: Ronnette Juniper, MD;  Location: [place];  Service: Gastroenterology;  Laterality: N/A;   ESOPHAGOGASTRODUODENOSCOPY (EGD) WITH PROPOFOL N/A 10/27/2020   Procedure: ESOPHAGOGASTRODUODENOSCOPY (EGD) WITH PROPOFOL  WITH  POSSIBLE DIL;  Surgeon: Arta Silence, MD;  Location: [facility] ENDOSCOPY;  Service: Endoscopy;  Laterality: N/A;   FOREIGN BODY REMOVAL  12/31/2020   Procedure: FOREIGN BODY REMOVAL;  Surgeon: Ronnette Juniper, MD;  Location: [facility] ENDOSCOPY;  Service: Gastroenterology;;   LOWER EXTREMITY ANGIOGRAM Left 05/27/2018   PERIPHERAL VASCULAR INTERVENTION Left 05/27/2018   Procedure: PERIPHERAL VASCULAR INTERVENTION;  Surgeon: Waynetta Sandy, MD;  Location: [place] [facility];  Service: Cardiovascular;  Laterality: Left;   POLYPECTOMY  04/04/2018   Procedure: POLYPECTOMY;  Surgeon: [name], MD;  Location: [facility] ENDOSCOPY;  Service: Endoscopy;;   POLYPECTOMY  10/27/2020   Procedure: POLYPECTOMY;  Surgeon: Arta Silence, MD;  Location: [facility] ENDOSCOPY;  Service: Endoscopy;;   TRANSMETATARSAL AMPUTATION Left 05/28/2018   Procedure: AMPUTATION TOES THREE, FOUR AND FIVE on left foot;  Surgeon: Waynetta Sandy, MD;  Location: [place];  Service: Vascular;  Laterality: Left;   WOUND DEBRIDEMENT Left 06/06/2018   Procedure: DEBRIDEMENT WOUND LEFT FOOT;  Surgeon: Waynetta Sandy, MD;  Location: [place];  Service: Vascular;  Laterality: Left;     Current Outpatient Medications  Medication Sig Dispense Refill   ACCU-CHEK GUIDE test strip 2 (two) times daily. as directed     Accu-Chek Softclix Lancets lancets 2 (two) times daily.     amLODipine (NORVASC) 10 MG tablet Take 1 tablet (10 mg total) by mouth daily. 30 tablet 0   ARIPiprazole (ABILIFY) 5 MG tablet Take 5 mg by mouth daily.     aspirin EC 81 MG EC tablet Take 1 tablet (81 mg total) by mouth daily.     atorvastatin (LIPITOR) 40 MG tablet Take 2 tablets (80 mg total) by mouth daily at 6 PM.     Blood Glucose Monitoring Suppl (ACCU-CHEK GUIDE) w/Device KIT USE AS DIRECTED TO CHECK BLOOD SUGAR     ciprofloxacin (CIPRO) 250 MG tablet Take 1 tablet (250 mg total) by mouth 2 (two) times daily.     cloNIDine (CATAPRES) 0.2 MG tablet Take 0.2 mg by  mouth 2 (two) times daily. For 7 days     hydrALAZINE (APRESOLINE) 100 MG tablet Take 1 tablet (100 mg total) by mouth 3 (three) times daily. 90 tablet 0   insulin aspart (NOVOLOG) 100 UNIT/ML injection Inject 0-9 Units into the skin 3 (three) times daily with meals. Correction coverage: Sensitive (thin, NPO, renal) CBG < 70: Implement Hypoglycemia protocol. CBG 70 - 120: 0 units CBG 121 - 150: 1 unit CBG 151 - 200: 2 units CBG 201 - 250: 3 units CBG 251 - 300: 5 units CBG 301 - 350: 7 units CBG 351 - 400: 9 units CBG > 400: call MD.     insulin glargine (LANTUS) 100 UNIT/ML injection Inject 0.1 mLs (10 Units total) into the skin daily. 10 mL 0   lisinopril (ZESTRIL) 40 MG tablet Take 1 tablet (40 mg total) by mouth daily. 30 tablet 0   metoprolol succinate (TOPROL-XL) 50 MG  24 hr tablet Take 50 mg by mouth every morning.     mirtazapine (REMERON) 15 MG tablet Take 1 tablet (15 mg total) by mouth at bedtime. For Depression (Patient taking differently: Take 15 mg by mouth at bedtime. For Depression , appetite) 30 tablet 0   pantoprazole (PROTONIX) 40 MG tablet Take 1 tablet (40 mg total) by mouth daily.     PEG 3350-KCl-NaBcb-NaCl-NaSulf (PEG-3350/ELECTROLYTES) 236 g SOLR SMARTSIG:Milliliter(s) By Mouth     No current facility-administered medications for this visit.    Allergies:   Morphine and related and Metformin   ROS:  Please see the history of present illness.   Otherwise, review of systems are positive for {NONE DEFAULTED:18576}.   All other systems are reviewed and negative.    PHYSICAL EXAM: VS:  LMP 04/26/2018 (Approximate)  , BMI There is no height or weight on file to calculate BMI. GENERAL:  Well appearing NECK:  No jugular venous distention, waveform within normal limits, carotid upstroke brisk and symmetric, no bruits, no thyromegaly LUNGS:  Clear to auscultation bilaterally CHEST:  Unremarkable HEART:  PMI not displaced or sustained,S1 and S2 within normal limits, no  S3, no S4, no clicks, no rubs, *** murmurs ABD:  Flat, positive bowel sounds normal in frequency in pitch, no bruits, no rebound, no guarding, no midline pulsatile mass, no hepatomegaly, no splenomegaly EXT:  2 plus pulses throughout, no edema, no cyanosis no clubbing    ***GENERAL:  Well appearing HEENT:  Pupils equal round and reactive, fundi not visualized, oral mucosa unremarkable NECK:  No jugular venous distention, waveform within normal limits, carotid upstroke brisk and symmetric, no bruits, no thyromegaly LYMPHATICS:  No cervical, inguinal adenopathy LUNGS:  Clear to auscultation bilaterally BACK:  No CVA tenderness CHEST:  Unremarkable HEART:  PMI not displaced or sustained,S1 and S2 within normal limits, no S3, no S4, no clicks, no rubs, *** murmurs ABD:  Flat, positive bowel sounds normal in frequency in pitch, no bruits, no rebound, no guarding, no midline pulsatile mass, no hepatomegaly, no splenomegaly EXT:  2 plus pulses throughout, no edema, no cyanosis no clubbing SKIN:  No rashes no nodules NEURO:  Cranial nerves II through XII grossly intact, motor grossly intact throughout PSYCH:  Cognitively intact, oriented to person place and time    EKG:  EKG {ACTION; IS/IS XBM:84132440} ordered today. The ekg ordered today demonstrates ***   Recent Labs: 01/16/2021: ALT 23 01/18/2021: Magnesium 2.0 01/20/2021: BUN 38; Creatinine, Ser 1.23; Hemoglobin 9.6; Platelets 329; Potassium 4.2; Sodium 138    Lipid Panel    Component Value Date/Time   CHOL 143 01/16/2021 2316   TRIG 57 01/16/2021 2316   HDL 72 01/16/2021 2316   CHOLHDL 2.0 01/16/2021 2316   VLDL 11 01/16/2021 2316   LDLCALC 60 01/16/2021 2316      Wt Readings from Last 3 Encounters:  01/11/21 188 lb 7.9 oz (85.5 kg)  01/04/21 188 lb 7.9 oz (85.5 kg)  12/31/20 169 lb 12.1 oz (77 kg)      Other studies Reviewed: Additional studies/ records that were reviewed today include: ***. Review of the above  records demonstrates:  Please see elsewhere in the note.  ***   ASSESSMENT AND PLAN:  Chest pain and abnormal EKG:    She ruled out for MI.  No further work up was suggested.  ***   Patient presents with acute onset of chest pain at rest relieved with nitroglycerin and fentanyl.  She has multiple cardiac risk  factors including recurrent strokes, hypertension, hyperlipidemia, diabetes mellitus, and polysubstance abuse.  She is currently pain-free.  EKG shows normal sinus rhythm with LVH and ST elevation in V2 and V3 that is more pronounced than on her most recent prior tracing but is similar to some tracings including 03/09/2020.  She also has lateral T wave inversions.  I suspect that her ST/T changes seen today are most consistent with abnormal repolarization in the setting of her LVH and uncontrolled hypertension.  I have performed a bedside echocardiogram in the ED today, which shows normal LVEF without anterior hypokinesis.  I do not believe that this represents a STEMI.  Additionally, I think the risks of emergent cardiac catheterization without definitive evidence of ongoing acute MI outweigh the benefits, given the patient's history of uncontrolled hypertension, recurrent strokes (including hemorrhagic stroke), polysubstance abuse, and repeated medication nonadherence. Trend troponin. Defer adding heparin unless there is objective evidence of ongoing ischemia (chest pain with dynamic EKG changes or significant bump in troponin). Continue aspirin and atorvastatin.   Hypertension:  ***   Blood pressure poorly controlled in the ED today. Continue home regimen of amlodipine, clonidine, hydralazine, lisinopril, and metoprolol.   History of strokes:  ***   No new deficits reported by the patient or EMS. Continue blood pressure and lipid control as well as antiplatelet therapy.   Polysubstance abuse:  ***   Patient with history of tobacco, marijuana, and cocaine use.  She denies using illicit  drugs since her last discharge. Check urine drug screen.  Current medicines are reviewed at length with the patient today.  The patient {ACTIONS; HAS/DOES NOT HAVE:19233} concerns regarding medicines.  The following changes have been made:  {PLAN; NO CHANGE:13088:s}  Labs/ tests ordered today include: *** No orders of the defined types were placed in this encounter.    Disposition:   FU with ***    Signed, Minus Breeding, MD  02/23/2021 8:42 PM    [facility]

## 2021-02-24 ENCOUNTER — Ambulatory Visit: Payer: Medicare Other | Admitting: Cardiology

## 2021-02-24 ENCOUNTER — Telehealth: Payer: Self-pay

## 2021-02-24 NOTE — Telephone Encounter (Signed)
Called patient, advised that due to loss of power we would have to reschedule appointment.  Left call back number to contact us to let us know that she received the message.

## 2021-02-25 ENCOUNTER — Ambulatory Visit (INDEPENDENT_AMBULATORY_CARE_PROVIDER_SITE_OTHER): Payer: Medicare Other | Admitting: Cardiology

## 2021-02-25 ENCOUNTER — Other Ambulatory Visit: Payer: Self-pay

## 2021-02-25 ENCOUNTER — Encounter: Payer: Self-pay | Admitting: Cardiology

## 2021-02-25 VITALS — BP 140/82 | HR 65 | Ht 61.0 in | Wt 203.0 lb

## 2021-02-25 DIAGNOSIS — M79605 Pain in left leg: Secondary | ICD-10-CM

## 2021-02-25 DIAGNOSIS — M79604 Pain in right leg: Secondary | ICD-10-CM

## 2021-02-25 DIAGNOSIS — I639 Cerebral infarction, unspecified: Secondary | ICD-10-CM | POA: Diagnosis not present

## 2021-02-25 NOTE — Patient Instructions (Addendum)
Medication Instructions:  Your physician recommends that you continue on your current medications as directed. Please refer to the Current Medication list given to you today.  *If you need a refill on your cardiac medications before your next appointment, please call your pharmacy*  Lab Work: NONE ordered at this time of appointment   If you have labs (blood work) drawn today and your tests are completely normal, you will receive your results only by: [place] (if you have MyChart) OR A paper copy in the mail If you have any lab test that is abnormal or we need to change your treatment, we will call you to review the results.  Testing/Procedures: Your physician has requested that you have an ankle brachial index (ABI). During this test an ultrasound and blood pressure cuff are used to evaluate the arteries that supply the arms and legs with blood. Allow thirty minutes for this exam. There are no restrictions or special instructions.  Please schedule for 1-2 weeks   Follow-Up: At [facility], you and your health needs are our priority.  As part of our continuing mission to provide you with exceptional heart care, we have created designated Provider Care Teams.  These Care Teams include your primary Cardiologist (physician) and Advanced Practice Providers (APPs -  Physician Assistants and Nurse Practitioners) who all work together to provide you with the care you need, when you need it.  Your next appointment:   1 year(s)  The format for your next appointment:   In Person  Provider:   Minus Breeding, MD  Other Instructions

## 2021-02-25 NOTE — Progress Notes (Signed)
Cardiology Office Note   Date:  02/25/2021   ID:  Diamond Rice, DOB 1971/06/30, MRN [id number]  PCP:  System, Provider Not In  Cardiologist:   Minus Breeding, MD   Chief Complaint  Patient presents with   Leg Pain            History of Present Illness: Diamond Rice is a 50 y.o. female who presents for evaluation of LVH.  The patient was in the hospital with pulmonary embolism following toe amputation.  Her EF is found to be 65 to 70%.  She is noted to have mild to moderate aortic insufficiency.  There was a mention in her notes of having a previous MI but I do not see a record of this.  I do see that she had a previous perfusion study in 2013 without ischemia or infarct.   She reports that there was a heart catheterization some years ago when I see mention in 08/2009.  She said there was some small vessel disease.  Other than that there is not been any significant cardiac history.  She is had difficult to control hypertension with a strong family history of this.  She had a stroke with left-sided weakness.    She was hospitalized at [facility] from 5/2 - 01/03/2021 for bilateral leg pain and dysarthria in the setting of marked hypertension.  It was felt that her recurrent stroke was secondary to small vessel ischemic disease in the setting of uncontrolled hypertension.  Brain MRI showed a 4 to 5 mm area of acute infarction in the left hemispheric white matter of the upper parietal centrum ovale region.  During the hospitalization, she complained of dysphagia and underwent EGD with esophageal dilation and Botox injection for achalasia.    I reviewed these records for this visit.    She is currently at rehab for unspecified period of time.  She is working with physical therapy.  She says she does have some leg pain when she is trying to walk and she points to her right calf.  She has a left hemiparesis.  She is not describing any new shortness of breath, PND or orthopnea.  Has had no  new palpitations, presyncope or syncope.   Past Medical History:  Diagnosis Date   Acute ischemic stroke ([place]) 12/27/2020   Acute renal failure (ARF) ([place]) 04/02/2018   Anxiety    Chronic lower back pain 04/17/2012   "just got over some; catched when I walked"   COPD (chronic obstructive pulmonary disease) ([place])    Critical lower limb ischemia ([place]) 05/27/2018   Depression    Headache(784.0) 04/17/2012   "~ qod; lately waking up in am w/one"   High cholesterol    Hypertension    Migraines 04/17/2012   Obesity    Sleep apnea    CPAP   Stroke ([place])    Type II diabetes mellitus ([place]) 08/28/2002    Past Surgical History:  Procedure Laterality Date   ABDOMINAL AORTOGRAM W/LOWER EXTREMITY Left 05/27/2018   Procedure: ABDOMINAL AORTOGRAM W/LOWER EXTREMITY [place] and Possible Intervention;  Surgeon: Waynetta Sandy, MD;  Location: [place] [facility];  Service: Cardiovascular;  Laterality: Left;   APPLICATION OF WOUND VAC Left 06/06/2018   Procedure: APPLICATION OF WOUND VAC;  Surgeon: Waynetta Sandy, MD;  Location: [place];  Service: Vascular;  Laterality: Left;   BALLOON DILATION N/A 12/31/2020   Procedure: [place];  Surgeon: Ronnette Juniper, MD;  Location: [place];  Service: Gastroenterology;  Laterality: N/A;   BIOPSY  04/04/2018   Procedure: BIOPSY;  Surgeon: [name], MD;  Location: [facility] ENDOSCOPY;  Service: Endoscopy;;   BIOPSY  10/27/2020   Procedure: BIOPSY;  Surgeon: Arta Silence, MD;  Location: [facility] ENDOSCOPY;  Service: Endoscopy;;   BOTOX INJECTION N/A 12/31/2020   Procedure: BOTOX INJECTION;  Surgeon: Ronnette Juniper, MD;  Location: [place];  Service: Gastroenterology;  Laterality: N/A;   CARDIAC CATHETERIZATION  ~ 2011   COLONOSCOPY N/A 04/04/2018   Procedure: COLONOSCOPY;  Surgeon: [name], MD;  Location: [facility] ENDOSCOPY;  Service: Endoscopy;  Laterality: N/A;   COLONOSCOPY WITH PROPOFOL N/A 10/27/2020   Procedure: COLONOSCOPY WITH PROPOFOL;  Surgeon:  Arta Silence, MD;  Location: [facility] ENDOSCOPY;  Service: Endoscopy;  Laterality: N/A;   ESOPHAGOGASTRODUODENOSCOPY N/A 12/31/2020   Procedure: ESOPHAGOGASTRODUODENOSCOPY (EGD);  Surgeon: Ronnette Juniper, MD;  Location: [place];  Service: Gastroenterology;  Laterality: N/A;   ESOPHAGOGASTRODUODENOSCOPY (EGD) WITH PROPOFOL N/A 10/27/2020   Procedure: ESOPHAGOGASTRODUODENOSCOPY (EGD) WITH PROPOFOL  WITH POSSIBLE DIL;  Surgeon: Arta Silence, MD;  Location: [facility] ENDOSCOPY;  Service: Endoscopy;  Laterality: N/A;   FOREIGN BODY REMOVAL  12/31/2020   Procedure: FOREIGN BODY REMOVAL;  Surgeon: Ronnette Juniper, MD;  Location: [facility] ENDOSCOPY;  Service: Gastroenterology;;   LOWER EXTREMITY ANGIOGRAM Left 05/27/2018   PERIPHERAL VASCULAR INTERVENTION Left 05/27/2018   Procedure: PERIPHERAL VASCULAR INTERVENTION;  Surgeon: Waynetta Sandy, MD;  Location: [place] [facility];  Service: Cardiovascular;  Laterality: Left;   POLYPECTOMY  04/04/2018   Procedure: POLYPECTOMY;  Surgeon: [name], MD;  Location: [facility] ENDOSCOPY;  Service: Endoscopy;;   POLYPECTOMY  10/27/2020   Procedure: POLYPECTOMY;  Surgeon: Arta Silence, MD;  Location: [facility] ENDOSCOPY;  Service: Endoscopy;;   TRANSMETATARSAL AMPUTATION Left 05/28/2018   Procedure: AMPUTATION TOES THREE, FOUR AND FIVE on left foot;  Surgeon: Waynetta Sandy, MD;  Location: [place];  Service: Vascular;  Laterality: Left;   WOUND DEBRIDEMENT Left 06/06/2018   Procedure: DEBRIDEMENT WOUND LEFT FOOT;  Surgeon: Waynetta Sandy, MD;  Location: [place];  Service: Vascular;  Laterality: Left;     Current Outpatient Medications  Medication Sig Dispense Refill   ACCU-CHEK GUIDE test strip 2 (two) times daily. as directed     Accu-Chek Softclix Lancets lancets 2 (two) times daily.     amLODipine (NORVASC) 10 MG tablet Take 1 tablet (10 mg total) by mouth daily. 30 tablet 0   ARIPiprazole (ABILIFY) 5 MG tablet Take 5 mg by mouth daily.     aspirin EC 81 MG EC  tablet Take 1 tablet (81 mg total) by mouth daily.     atorvastatin (LIPITOR) 40 MG tablet Take 2 tablets (80 mg total) by mouth daily at 6 PM.     Blood Glucose Monitoring Suppl (ACCU-CHEK GUIDE) w/Device KIT USE AS DIRECTED TO CHECK BLOOD SUGAR     ciprofloxacin (CIPRO) 250 MG tablet Take 1 tablet (250 mg total) by mouth 2 (two) times daily.     hydrALAZINE (APRESOLINE) 100 MG tablet Take 1 tablet (100 mg total) by mouth 3 (three) times daily. 90 tablet 0   insulin aspart (NOVOLOG) 100 UNIT/ML injection Inject 0-9 Units into the skin 3 (three) times daily with meals. Correction coverage: Sensitive (thin, NPO, renal) CBG < 70: Implement Hypoglycemia protocol. CBG 70 - 120: 0 units CBG 121 - 150: 1 unit CBG 151 - 200: 2 units CBG 201 - 250: 3 units CBG 251 - 300: 5 units CBG 301 -  350: 7 units CBG 351 - 400: 9 units CBG > 400: call MD.     insulin glargine (LANTUS) 100 UNIT/ML injection Inject 0.1 mLs (10 Units total) into the skin daily. 10 mL 0   lisinopril (ZESTRIL) 40 MG tablet Take 1 tablet (40 mg total) by mouth daily. 30 tablet 0   metoprolol succinate (TOPROL-XL) 50 MG 24 hr tablet Take 50 mg by mouth every morning.     mirtazapine (REMERON) 15 MG tablet Take 1 tablet (15 mg total) by mouth at bedtime. For Depression (Patient taking differently: Take 15 mg by mouth at bedtime. For Depression , appetite) 30 tablet 0   pantoprazole (PROTONIX) 40 MG tablet Take 1 tablet (40 mg total) by mouth daily.     PEG 3350-KCl-NaBcb-NaCl-NaSulf (PEG-3350/ELECTROLYTES) 236 g SOLR SMARTSIG:Milliliter(s) By Mouth     cloNIDine (CATAPRES) 0.2 MG tablet Take 0.2 mg by mouth 2 (two) times daily. For 7 days     No current facility-administered medications for this visit.    Allergies:   Morphine and related and Metformin   ROS:  Please see the history of present illness.   Otherwise, review of systems are positive for none.   All other systems are reviewed and negative.    PHYSICAL EXAM: VS:  BP  140/82 (BP Location: Right Arm)   Pulse 65   Ht 5' 1" (1.549 m)   Wt 203 lb (92.1 kg)   LMP 04/26/2018 (Approximate)   SpO2 99%   BMI 38.36 kg/m  , BMI Body mass index is 38.36 kg/m. GEN:  No distress NECK:  No jugular venous distention at 90 degrees, waveform within normal limits, carotid upstroke brisk and symmetric, no bruits, no thyromegaly LYMPHATICS:  No cervical adenopathy LUNGS:  Clear to auscultation bilaterally BACK:  No CVA tenderness CHEST:  Unremarkable HEART:  S1 and S2 within normal limits, no S3, no S4, no clicks, no rubs, soft apical systolic murmur radiating slightly at aortic outflow tract murmurs ABD:  Positive bowel sounds normal in frequency in pitch, no bruits, no rebound, no guarding, unable to assess midline mass or bruit with the patient seated. EXT:  2 plus pulses upper, diminished dorsalis pedis and posterior tibialis bilateral lower, trace nonpitting edema, no cyanosis no clubbing SKIN:  No rashes no nodules NEURO: Dense left hemiparesis with mild difficulty speaking PSYCH:  Cognitively intact, oriented to person place and time   EKG:  EKG is not ordered today. NA   Recent Labs: 01/16/2021: ALT 23 01/18/2021: Magnesium 2.0 01/20/2021: BUN 38; Creatinine, Ser 1.23; Hemoglobin 9.6; Platelets 329; Potassium 4.2; Sodium 138    Lipid Panel    Component Value Date/Time   CHOL 143 01/16/2021 2316   TRIG 57 01/16/2021 2316   HDL 72 01/16/2021 2316   CHOLHDL 2.0 01/16/2021 2316   VLDL 11 01/16/2021 2316   LDLCALC 60 01/16/2021 2316      Wt Readings from Last 3 Encounters:  02/25/21 203 lb (92.1 kg)  01/11/21 188 lb 7.9 oz (85.5 kg)  01/04/21 188 lb 7.9 oz (85.5 kg)      Other studies Reviewed: Additional studies/ records that were reviewed today include: Hospital records. Review of the above records demonstrates:  Please see elsewhere in the note.     ASSESSMENT AND PLAN:  Chest pain and abnormal EKG:    She ruled out for MI.  No further  work up was suggested.    Hypertension: Is controlled.  She recently had her clonidine increased.  No change in therapy.  History of strokes: She will continue with risk reduction.  Sleep apnea: I given a written note to her rehab facility that she needs her home CPAP.  She has been without this.  DM: Her diabetes is not at target.  Hemoglobin A1c is 7.6.  I will defer to her primary providers and facility physicians.  LEG PAIN: She does have reduced pulses and I will order ABIs.  Current medicines are reviewed at length with the patient today.  The patient does not have concerns regarding medicines.  The following changes have been made:  no change  Labs/ tests ordered today include:   Orders Placed This Encounter  Procedures   VAS [place] ABI WITH/WO TBI      Disposition:   FU with me in one year.     Signed, Minus Breeding, MD  02/25/2021 3:16 PM    [facility]

## 2021-03-02 ENCOUNTER — Other Ambulatory Visit: Payer: Self-pay | Admitting: Cardiology

## 2021-03-02 DIAGNOSIS — M79605 Pain in left leg: Secondary | ICD-10-CM

## 2021-03-02 DIAGNOSIS — Z9862 Peripheral vascular angioplasty status: Secondary | ICD-10-CM

## 2021-03-03 ENCOUNTER — Ambulatory Visit (INDEPENDENT_AMBULATORY_CARE_PROVIDER_SITE_OTHER): Payer: Medicare Other | Admitting: Adult Health

## 2021-03-03 ENCOUNTER — Other Ambulatory Visit: Payer: Self-pay

## 2021-03-03 ENCOUNTER — Encounter: Payer: Self-pay | Admitting: Adult Health

## 2021-03-03 VITALS — BP 137/85 | HR 71 | Ht 61.0 in | Wt 196.0 lb

## 2021-03-03 DIAGNOSIS — E785 Hyperlipidemia, unspecified: Secondary | ICD-10-CM | POA: Diagnosis not present

## 2021-03-03 DIAGNOSIS — I639 Cerebral infarction, unspecified: Secondary | ICD-10-CM | POA: Diagnosis not present

## 2021-03-03 DIAGNOSIS — I10 Essential (primary) hypertension: Secondary | ICD-10-CM

## 2021-03-03 DIAGNOSIS — Z8673 Personal history of transient ischemic attack (TIA), and cerebral infarction without residual deficits: Secondary | ICD-10-CM | POA: Diagnosis not present

## 2021-03-03 DIAGNOSIS — E1165 Type 2 diabetes mellitus with hyperglycemia: Secondary | ICD-10-CM

## 2021-03-03 DIAGNOSIS — Z794 Long term (current) use of insulin: Secondary | ICD-10-CM

## 2021-03-03 NOTE — Patient Instructions (Signed)
Continue working with therapies for hopeful ongoing recovery  Continue aspirin 81 mg daily  and atorvastatin  for secondary stroke prevention  Continue to follow up with PCP regarding cholesterol, blood pressure and diabetes management  Maintain strict control of hypertension with blood pressure goal below 130/90, diabetes with hemoglobin A1c goal below 7% and cholesterol with LDL cholesterol (bad cholesterol) goal below 70 mg/dL.       Followup in the future with me in 3 months or call earlier if needed       Thank you for coming to see [place] at [facility]. I hope we have been able to provide you high quality care today.  You may receive a patient satisfaction survey over the next few weeks. We would appreciate your feedback and comments so that we may continue to improve ourselves and the health of our patients.

## 2021-03-03 NOTE — Progress Notes (Signed)
[facility] [address] [place] [address] [phone]       HOSPITAL FOLLOW UP NOTE  [name] Date of Birth:  1971-01-18 Medical Record Number:  [id number]   Reason for Referral:  hospital stroke follow up    SUBJECTIVE:   CHIEF COMPLAINT:  Chief Complaint  Patient presents with   Cerebrovascular Accident    Rm 1 hospital FU, residing at [place]    HPI:   [name] is a 50 y.o. female with history of PE (was on Xarelto - d/c'd after ICH), hypertension, PVD, diabetes, HLD, obesity, migraine, COPD, CKD, history of stroke and ICH admitted on 12/27/2020 for worsening slurred speech, and difficulty walking.  Presently reviewed hospitalization pertinent progress notes, lab work and imaging summary provided.  Evaluated by Dr. Erlinda Hong with stroke work-up revealing left CR infarct likely due to small vessel disease source in setting of uncontrolled risk factors including HTN and HLD.  CTA head/neck negative LVO.  EF 60 to 65%.  LDL 136.  A1c 6.6.  UDS positive for THC.  Advised continuation of aspirin as well as increase home dose atorvastatin from 40 mg to 80 mg daily.  DAPT not recommended due to history of ICH and uncontrolled HTN.  History of prior strokes 3 ischemic and 1 hemorrhagic with chronic left-sided deficits from hemorrhagic stroke with most recent ischemic stroke 11/2019.  Initially evaluated by therapies and recommended CIR but deemed not appropriate as she was homeless and eventually discharged to SNF. Episode of worsening of speech and all extremities weaker than baseline on 5/5 with MRI negative for acute change and likely in setting of recrudescence of old strokes in setting of anxiety and depression versus conversion disorder.  Stroke:  left CR infarct, likely secondary to small vessel disease source likely due to  Uncontrolled risk factors including HTN, HLD CT head no acute abnormality CT head  and neck no LVO MRI left CR acute infarct 2D Echo EF 60 to 65% LDL 136 HgbA1c 6.6 UDS positive for THC Lovenox for VTE prophylaxis aspirin 81 mg daily prior to admission, now on aspirin 81 mg daily. Continue on discharge. No DAPT given hx of ICH and current uncontrolled HTN Patient counseled to be compliant with her antithrombotic medications Ongoing aggressive stroke risk factor management Therapy recommendations:  CIR --> SNF (homeless) Disposition:  SNF   Today, 03/03/2021, [name] is being seen for hospital follow-up unaccompanied.  She is currently residing at [facility].  She is currently receiving therapies for continued increased left-sided weakness compared to baseline and dysarthria but does report continued improvement.  She reports improvement of right-sided weakness.  She is able to ambulate short distance during therapy sessions otherwise transfers via wheelchair.  Review of facility [facility] confirms current use of aspirin and atorvastatin 40 mg daily.  Blood pressure today 137/85.  She reports blood pressures and glucose levels at facility have been stable but unable to provide levels. Per review of epic, she was diagnosed with COVID-19 beginning of May (at [facility]) as well as hospital admission on 01/16/2021 -01/20/2021 with left facial weakness, right-sided numbness, slurred speech and expressive aphasia likely recrudescence of prior stroke with UTI with significant improvement after treatment for UTI with CT, CTA and MRI brain negative.  She has not had any additional or new stroke/TIA symptoms since that time.  No further concerns at this time.     ROS:   14 system  review of systems performed and negative with exception of those listed in HPI  PMH:  Past Medical History:  Diagnosis Date   Acute ischemic stroke ([place]) 12/27/2020   Acute renal failure (ARF) ([place]) 04/02/2018   Anxiety    Chronic lower back pain 04/17/2012   "just got over some; catched when I walked"    COPD (chronic obstructive pulmonary disease) ([place])    Critical lower limb ischemia ([place]) 05/27/2018   Depression    Headache(784.0) 04/17/2012   "~ qod; lately waking up in am w/one"   High cholesterol    Hypertension    Migraines 04/17/2012   Obesity    Sleep apnea    CPAP   Stroke ([place])    Type II diabetes mellitus ([place]) 08/28/2002    PSH:  Past Surgical History:  Procedure Laterality Date   ABDOMINAL AORTOGRAM W/LOWER EXTREMITY Left 05/27/2018   Procedure: ABDOMINAL AORTOGRAM W/LOWER EXTREMITY [place] and Possible Intervention;  Surgeon: Waynetta Sandy, MD;  Location: [place] [facility];  Service: Cardiovascular;  Laterality: Left;   APPLICATION OF WOUND VAC Left 06/06/2018   Procedure: APPLICATION OF WOUND VAC;  Surgeon: Waynetta Sandy, MD;  Location: [place];  Service: Vascular;  Laterality: Left;   BALLOON DILATION N/A 12/31/2020   Procedure: [place];  Surgeon: Ronnette Juniper, MD;  Location: [place];  Service: Gastroenterology;  Laterality: N/A;   BIOPSY  04/04/2018   Procedure: BIOPSY;  Surgeon: [name], MD;  Location[facility] ENDOSCOPY;  Service: Endoscopy;;   BIOPSY  10/27/2020   Procedure: BIOPSY;  Surgeon: Arta Silence, MD;  Location: [facility] ENDOSCOPY;  Service: Endoscopy;;   BOTOX INJECTION N/A 12/31/2020   Procedure: BOTOX INJECTION;  Surgeon: Ronnette Juniper, MD;  Location: [place];  Service: Gastroenterology;  Laterality: N/A;   CARDIAC CATHETERIZATION  ~ 2011   COLONOSCOPY N/A 04/04/2018   Procedure: COLONOSCOPY;  Surgeon: [name], MD;  Location: [facility] ENDOSCOPY;  Service: Endoscopy;  Laterality: N/A;   COLONOSCOPY WITH PROPOFOL N/A 10/27/2020   Procedure: COLONOSCOPY WITH PROPOFOL;  Surgeon: Arta Silence, MD;  Location: [facility] ENDOSCOPY;  Service: Endoscopy;  Laterality: N/A;   ESOPHAGOGASTRODUODENOSCOPY N/A 12/31/2020   Procedure: ESOPHAGOGASTRODUODENOSCOPY (EGD);  Surgeon: Ronnette Juniper, MD;  Location: [place];  Service: Gastroenterology;   Laterality: N/A;   ESOPHAGOGASTRODUODENOSCOPY (EGD) WITH PROPOFOL N/A 10/27/2020   Procedure: ESOPHAGOGASTRODUODENOSCOPY (EGD) WITH PROPOFOL  WITH POSSIBLE DIL;  Surgeon: Arta Silence, MD;  Location: [facility] ENDOSCOPY;  Service: Endoscopy;  Laterality: N/A;   FOREIGN BODY REMOVAL  12/31/2020   Procedure: FOREIGN BODY REMOVAL;  Surgeon: Ronnette Juniper, MD;  Location: [facility] ENDOSCOPY;  Service: Gastroenterology;;   LOWER EXTREMITY ANGIOGRAM Left 05/27/2018   PERIPHERAL VASCULAR INTERVENTION Left 05/27/2018   Procedure: PERIPHERAL VASCULAR INTERVENTION;  Surgeon: Waynetta Sandy, MD;  Location: [place] [facility];  Service: Cardiovascular;  Laterality: Left;   POLYPECTOMY  04/04/2018   Procedure: POLYPECTOMY;  Surgeon: [name], MD;  Location: [facility] ENDOSCOPY;  Service: Endoscopy;;   POLYPECTOMY  10/27/2020   Procedure: POLYPECTOMY;  Surgeon: Arta Silence, MD;  Location: [facility] ENDOSCOPY;  Service: Endoscopy;;   TRANSMETATARSAL AMPUTATION Left 05/28/2018   Procedure: AMPUTATION TOES THREE, FOUR AND FIVE on left foot;  Surgeon: Waynetta Sandy, MD;  Location: [place];  Service: Vascular;  Laterality: Left;   WOUND DEBRIDEMENT Left 06/06/2018   Procedure: DEBRIDEMENT WOUND LEFT FOOT;  Surgeon: Waynetta Sandy, MD;  Location: [place];  Service: Vascular;  Laterality: Left;    Social History:  Social  History   Socioeconomic History   Marital status: Single    Spouse name: Not on file   Number of children: 0   Years of education: Not on file   Highest education level: Not on file  Occupational History   Occupation: disabled  Tobacco Use   Smoking status: Former    Packs/day: 0.25    Years: 28.00    Pack years: 7.00    Types: Cigarettes   Smokeless tobacco: Never  Vaping Use   Vaping Use: Never used  Substance and Sexual Activity   Alcohol use: No    Alcohol/week: 0.0 standard drinks    Comment: rare   Drug use: Yes    Types: Marijuana, "Crack" cocaine    Comment:  marijuana last use days ago; last cocaine several months ago   Sexual activity: Not Currently    Birth control/protection: None  Other Topics Concern   Not on file  Social History Narrative   Was a  Quarry manager at [employer]. Lives with godmother.   03/03/21 residing at [facility] since 01/03/21     Social Determinants of Health   Financial Resource Strain: Not on file  Food Insecurity: Not on file  Transportation Needs: Not on file  Physical Activity: Not on file  Stress: Not on file  Social Connections: Not on file  Intimate Partner Violence: Not on file    Family History:  Family History  Problem Relation Age of Onset   Stroke Brother 16   Stroke Brother 45   Heart attack Mother 32   Stroke Father 66    Medications:   Current Outpatient Medications on File Prior to Visit  Medication Sig Dispense Refill   ACCU-CHEK GUIDE test strip 2 (two) times daily. as directed     Accu-Chek Softclix Lancets lancets 2 (two) times daily.     amLODipine (NORVASC) 10 MG tablet Take 1 tablet (10 mg total) by mouth daily. 30 tablet 0   ARIPiprazole (ABILIFY) 5 MG tablet Take 5 mg by mouth daily.     aspirin EC 81 MG EC tablet Take 1 tablet (81 mg total) by mouth daily.     atorvastatin (LIPITOR) 40 MG tablet Take 2 tablets (80 mg total) by mouth daily at 6 PM.     Blood Glucose Monitoring Suppl (ACCU-CHEK GUIDE) w/Device KIT USE AS DIRECTED TO CHECK BLOOD SUGAR     cloNIDine (CATAPRES) 0.2 MG tablet Take 0.2 mg by mouth 2 (two) times daily. For 7 days     diphenhydrAMINE (BENADRYL) 25 MG tablet Take 25 mg by mouth every 6 (six) hours as needed.     hydrALAZINE (APRESOLINE) 100 MG tablet Take 1 tablet (100 mg total) by mouth 3 (three) times daily. 90 tablet 0   insulin aspart (NOVOLOG) 100 UNIT/ML injection Inject 0-9 Units into the skin 3 (three) times daily with meals. Correction coverage: Sensitive (thin, NPO, renal) CBG < 70: Implement Hypoglycemia protocol. CBG 70 - 120: 0  units CBG 121 - 150: 1 unit CBG 151 - 200: 2 units CBG 201 - 250: 3 units CBG 251 - 300: 5 units CBG 301 - 350: 7 units CBG 351 - 400: 9 units CBG > 400: call MD.     insulin glargine (LANTUS) 100 UNIT/ML injection Inject 0.1 mLs (10 Units total) into the skin daily. 10 mL 0   lisinopril (ZESTRIL) 40 MG tablet Take 1 tablet (40 mg total) by mouth daily. 30 tablet 0   metoprolol succinate (  TOPROL-XL) 50 MG 24 hr tablet Take 50 mg by mouth every morning.     mirtazapine (REMERON) 15 MG tablet Take 1 tablet (15 mg total) by mouth at bedtime. For Depression (Patient taking differently: Take 15 mg by mouth at bedtime. For Depression , appetite) 30 tablet 0   pantoprazole (PROTONIX) 40 MG tablet Take 1 tablet (40 mg total) by mouth daily.     traMADol (ULTRAM) 50 MG tablet Take by mouth every 6 (six) hours as needed.     ciprofloxacin (CIPRO) 250 MG tablet Take 1 tablet (250 mg total) by mouth 2 (two) times daily. (Patient not taking: Reported on 03/03/2021)     PEG 3350-KCl-NaBcb-NaCl-NaSulf (PEG-3350/ELECTROLYTES) 236 g SOLR SMARTSIG:Milliliter(s) By Mouth     No current facility-administered medications on file prior to visit.    Allergies:   Allergies  Allergen Reactions   Morphine And Related Hives   Metformin Diarrhea and Other (See Comments)    "Allergic," per MAR      OBJECTIVE:  Physical Exam  Vitals:   03/03/21 0952  BP: 137/85  Pulse: 71  Weight: 196 lb (88.9 kg)  Height: $Remove'5\' 1"'jhJdzsh$  (1.549 m)   Body mass index is 37.03 kg/m. No results found.   General: well developed, well nourished, middle-age African-American female, seated, in no evident distress Head: head normocephalic and atraumatic.   Neck: supple with no carotid or supraclavicular bruits Cardiovascular: regular rate and rhythm, no murmurs Musculoskeletal: no deformity Skin:  no rash/petichiae Vascular:  Normal pulses all extremities   Neurologic Exam Mental Status: Awake and fully alert.  Mild dysarthria  and hypophonia.  No evidence of aphasia.  Oriented to place and time. Recent and remote memory diminished. Attention span, concentration and fund of knowledge impaired. Mood and affect appropriate.  Cranial Nerves: Fundoscopic exam difficulty viewing disc margins.  Pupils equal, briskly reactive to light. Extraocular movements full without nystagmus. Visual fields full to confrontation. Hearing intact. Facial sensation intact.  Left lower facial weakness.  Tongue, and palate moves normally and symmetrically.  Motor: Normal strength, bulk and tone on right side.  Mild chronic left hemiparesis with LUE spasticity Sensory.:  Decreased light touch sensation LUE and LLE.  Coordination: Rapid alternating movements normal in all extremities on right side. Finger-to-nose and heel-to-shin performed accurately on right side. Gait and Station: Deferred Reflexes: 2+ LUE and LLE; 1+ RUE and RLE. Toes downgoing.     NIHSS  3 Modified Rankin  3-4      ASSESSMENT: Diamond Rice is a 50 y.o. year old female with recent left CR infarct likely secondary to small vessel disease with uncontrolled risk factors of HTN and HLD on 12/27/2020.  Vascular risk factors include history of prior strokes including both ischemic and ICH with residual left hemiparesis and dysarthria, HTN, HLD, DM, medication noncompliance, history of PE, migraines and substance abuse.      PLAN:  L CR stroke :  Residual deficit: Mild left hemiparesis with LUE spasticity, mild dysarthria and left facial weakness.  Encouraged continued participation with therapies at [facility] for hopeful further recovery.   Continue aspirin 81 mg daily  and atorvastatin 80 mg daily for secondary stroke prevention.  Discussed secondary stroke prevention measures and importance of close PCP follow up for aggressive stroke risk factor management  HTN: BP goal <130/90.  Stable today on current regimen monitored by facility HLD: LDL goal <70. Recent LDL 136  -continue atorvastatin 80 mg daily currently managed by SNF as well as  routine monitoring of lipid panel DMII: A1c goal<7.0. Recent A1c 6.6 routinely monitored by SNF.     Follow up in 3 months or call earlier if needed   CC:  [facility] provider: Dr. Leonie Man PCP: System, Provider Not In    I spent 43 minutes of face-to-face and non-face-to-face time with patient.  This included previsit chart review including review of recent hospitalization, lab review, study review, order entry, electronic health record documentation, and patient discussion and education regarding recent stroke including etiology, secondary stroke prevention measures and aggressive stroke risk factor management, residual deficits and typical recovery time and answered all questions to patient satisfaction  Frann Rider, AGNP-BC  [facility] [address] [place], [place] [address]  Phone [phone] Fax [phone] Note: This document was prepared with digital dictation and possible smart phrase technology. Any transcriptional errors that result from this process are unintentional.

## 2021-03-08 NOTE — Progress Notes (Signed)
I agree with the above plan 

## 2021-03-15 ENCOUNTER — Other Ambulatory Visit: Payer: Self-pay

## 2021-03-15 ENCOUNTER — Ambulatory Visit (HOSPITAL_COMMUNITY)
Admission: RE | Admit: 2021-03-15 | Discharge: 2021-03-15 | Disposition: A | Discharge status: Home / Self Care | Payer: Medicare Other | Source: Ambulatory Visit | Attending: Internal Medicine | Admitting: Internal Medicine

## 2021-03-15 DIAGNOSIS — M79605 Pain in left leg: Secondary | ICD-10-CM | POA: Diagnosis not present

## 2021-03-15 DIAGNOSIS — M79604 Pain in right leg: Secondary | ICD-10-CM | POA: Diagnosis not present

## 2021-03-15 DIAGNOSIS — Z9862 Peripheral vascular angioplasty status: Secondary | ICD-10-CM | POA: Diagnosis not present

## 2021-04-04 ENCOUNTER — Telehealth: Payer: Self-pay | Admitting: Cardiology

## 2021-04-04 ENCOUNTER — Encounter: Payer: Self-pay | Admitting: *Deleted

## 2021-04-04 ENCOUNTER — Other Ambulatory Visit: Payer: Self-pay

## 2021-04-04 DIAGNOSIS — M79605 Pain in left leg: Secondary | ICD-10-CM

## 2021-04-04 DIAGNOSIS — M79604 Pain in right leg: Secondary | ICD-10-CM

## 2021-04-04 NOTE — Telephone Encounter (Signed)
   Pt is staying at [facility] health care and they need copy of LE arterial result send there, she gave fax# [phone]

## 2021-04-04 NOTE — Telephone Encounter (Signed)
Patient is returning call to discuss LE Arterial Duplex results.

## 2021-04-04 NOTE — Telephone Encounter (Signed)
Sent to [facility]

## 2021-04-04 NOTE — Telephone Encounter (Signed)
Just FYI.  Called patient, advised of scan of legs. Will place referral to [facility] Dr.Cain.

## 2021-04-22 ENCOUNTER — Ambulatory Visit (INDEPENDENT_AMBULATORY_CARE_PROVIDER_SITE_OTHER): Payer: Medicare Other | Admitting: Vascular Surgery

## 2021-04-22 ENCOUNTER — Encounter: Payer: Self-pay | Admitting: Vascular Surgery

## 2021-04-22 ENCOUNTER — Other Ambulatory Visit: Payer: Self-pay

## 2021-04-22 VITALS — BP 174/98 | HR 64 | Temp 97.8°F | Resp 16 | Ht 61.0 in | Wt 219.0 lb

## 2021-04-22 DIAGNOSIS — I779 Disorder of arteries and arterioles, unspecified: Secondary | ICD-10-CM

## 2021-04-22 NOTE — Progress Notes (Signed)
Patient ID: Diamond Rice, female   DOB: 1971-02-19, 50 y.o.   MRN: [id number]  Reason for Consult: PAD (F/U bilateral lower extremity pain)   Referred by Minus Breeding, MD  Subjective:     HPI:  Diamond Rice is a 51 y.o. female history of extensive left lower extremity endovascular revascularization with anterior tibial artery cannulation.  This was done to heal wounds on her left foot which required 3 toe amputations.  They are now healed.  She is currently living in a nursing home.  She has swelling of the left foot greater than the right foot.  She does have chronic leg pain of both feet.  States that the pain in the left is worse at this time.  She has not had any recent endovascular intervention.  She tells me that her pain is constant really in her bilateral calfs.  She does not have any new tissue loss or ulceration.  She does not move her left leg or left arm very much due to previous CVA.  Past Medical History:  Diagnosis Date   Acute ischemic stroke ([place]) 12/27/2020   Acute renal failure (ARF) ([facility]) 04/02/2018   Anxiety    Chronic lower back pain 04/17/2012   "just got over some; catched when I walked"   COPD (chronic obstructive pulmonary disease) ([place])    Critical lower limb ischemia ([place]) 05/27/2018   Depression    Headache(784.0) 04/17/2012   "~ qod; lately waking up in am w/one"   High cholesterol    Hypertension    Migraines 04/17/2012   Obesity    Sleep apnea    CPAP   Stroke ([place])    Type II diabetes mellitus ([place]) 08/28/2002   Family History  Problem Relation Age of Onset   Stroke Brother 52   Stroke Brother 33   Heart attack Mother 39   Stroke Father 72   Past Surgical History:  Procedure Laterality Date   ABDOMINAL AORTOGRAM W/LOWER EXTREMITY Left 05/27/2018   Procedure: ABDOMINAL AORTOGRAM W/LOWER EXTREMITY Run[place] and Possible Intervention;  Surgeon: Waynetta Sandy, MD;  Location: [place] [facility];  Service: Cardiovascular;  Laterality:  Left;   APPLICATION OF WOUND VAC Left 06/06/2018   Procedure: APPLICATION OF WOUND VAC;  Surgeon: Waynetta Sandy, MD;  Location: [place];  Service: Vascular;  Laterality: Left;   BALLOON DILATION N/A 12/31/2020   Procedure: [place];  Surgeon: Ronnette Juniper, MD;  Location: [place];  Service: Gastroenterology;  Laterality: N/A;   BIOPSY  04/04/2018   Procedure: BIOPSY;  Surgeon: [name], MD;  Location[facility];  Service: Endoscopy;;   BIOPSY  10/27/2020   Procedure: BIOPSY;  Surgeon: Arta Silence, MD;  Location: [facility] ENDOSCOPY;  Service: Endoscopy;;   BOTOX INJECTION N/A 12/31/2020   Procedure: BOTOX INJECTION;  Surgeon: Ronnette Juniper, MD;  Location: [place];  Service: Gastroenterology;  Laterality: N/A;   CARDIAC CATHETERIZATION  ~ 2011   COLONOSCOPY N/A 04/04/2018   Procedure: COLONOSCOPY;  Surgeon: [name], MD;  Location: [facility] ENDOSCOPY;  Service: Endoscopy;  Laterality: N/A;   COLONOSCOPY WITH PROPOFOL N/A 10/27/2020   Procedure: COLONOSCOPY WITH PROPOFOL;  Surgeon: Arta Silence, MD;  Location: [facility];  Service: Endoscopy;  Laterality: N/A;   ESOPHAGOGASTRODUODENOSCOPY N/A 12/31/2020   Procedure: ESOPHAGOGASTRODUODENOSCOPY (EGD);  Surgeon: Ronnette Juniper, MD;  Location: [place];  Service: Gastroenterology;  Laterality: N/A;   ESOPHAGOGASTRODUODENOSCOPY (EGD) WITH PROPOFOL N/A 10/27/2020   Procedure: ESOPHAGOGASTRODUODENOSCOPY (EGD) WITH PROPOFOL  WITH  POSSIBLE DIL;  Surgeon: Arta Silence, MD;  Location: [facility];  Service: Endoscopy;  Laterality: N/A;   FOREIGN BODY REMOVAL  12/31/2020   Procedure: FOREIGN BODY REMOVAL;  Surgeon: Ronnette Juniper, MD;  Location: [facility] ENDOSCOPY;  Service: Gastroenterology;;   LOWER EXTREMITY ANGIOGRAM Left 05/27/2018   PERIPHERAL VASCULAR INTERVENTION Left 05/27/2018   Procedure: PERIPHERAL VASCULAR INTERVENTION;  Surgeon: Waynetta Sandy, MD;  Location: [place] [facility];  Service: Cardiovascular;  Laterality:  Left;   POLYPECTOMY  04/04/2018   Procedure: POLYPECTOMY;  Surgeon: [name], MD;  Location: [facility] ENDOSCOPY;  Service: Endoscopy;;   POLYPECTOMY  10/27/2020   Procedure: POLYPECTOMY;  Surgeon: Arta Silence, MD;  Location: [facility];  Service: Endoscopy;;   TRANSMETATARSAL AMPUTATION Left 05/28/2018   Procedure: AMPUTATION TOES THREE, FOUR AND FIVE on left foot;  Surgeon: Waynetta Sandy, MD;  Location: [place];  Service: Vascular;  Laterality: Left;   WOUND DEBRIDEMENT Left 06/06/2018   Procedure: DEBRIDEMENT WOUND LEFT FOOT;  Surgeon: Waynetta Sandy, MD;  Location: [place];  Service: Vascular;  Laterality: Left;    Short Social History:  Social History   Tobacco Use   Smoking status: Former    Packs/day: 0.25    Years: 28.00    Pack years: 7.00    Types: Cigarettes   Smokeless tobacco: Never  Substance Use Topics   Alcohol use: No    Alcohol/week: 0.0 standard drinks    Comment: rare    Allergies  Allergen Reactions   Morphine And Related Hives   Metformin Diarrhea and Other (See Comments)    "Allergic," per [facility]    Current Outpatient Medications  Medication Sig Dispense Refill   ACCU-CHEK GUIDE test strip 2 (two) times daily. as directed     Accu-Chek Softclix Lancets lancets 2 (two) times daily.     amLODipine (NORVASC) 10 MG tablet Take 1 tablet (10 mg total) by mouth daily. 30 tablet 0   ARIPiprazole (ABILIFY) 5 MG tablet Take 5 mg by mouth daily.     aspirin EC 81 MG EC tablet Take 1 tablet (81 mg total) by mouth daily.     atorvastatin (LIPITOR) 40 MG tablet Take 2 tablets (80 mg total) by mouth daily at 6 PM.     Blood Glucose Monitoring Suppl (ACCU-CHEK GUIDE) w/Device KIT USE AS DIRECTED TO CHECK BLOOD SUGAR     ciprofloxacin (CIPRO) 250 MG tablet Take 1 tablet (250 mg total) by mouth 2 (two) times daily. (Patient not taking: Reported on 03/03/2021)     cloNIDine (CATAPRES) 0.2 MG tablet Take 0.2 mg by mouth 2 (two) times daily. For 7 days      diphenhydrAMINE (BENADRYL) 25 MG tablet Take 25 mg by mouth every 6 (six) hours as needed.     hydrALAZINE (APRESOLINE) 100 MG tablet Take 1 tablet (100 mg total) by mouth 3 (three) times daily. 90 tablet 0   insulin aspart (NOVOLOG) 100 UNIT/ML injection Inject 0-9 Units into the skin 3 (three) times daily with meals. Correction coverage: Sensitive (thin, NPO, renal) CBG < 70: Implement Hypoglycemia protocol. CBG 70 - 120: 0 units CBG 121 - 150: 1 unit CBG 151 - 200: 2 units CBG 201 - 250: 3 units CBG 251 - 300: 5 units CBG 301 - 350: 7 units CBG 351 - 400: 9 units CBG > 400: call MD.     insulin glargine (LANTUS) 100 UNIT/ML injection Inject 0.1 mLs (10 Units total) into the skin daily. 10 mL  0   lisinopril (ZESTRIL) 40 MG tablet Take 1 tablet (40 mg total) by mouth daily. 30 tablet 0   metoprolol succinate (TOPROL-XL) 50 MG 24 hr tablet Take 50 mg by mouth every morning.     mirtazapine (REMERON) 15 MG tablet Take 1 tablet (15 mg total) by mouth at bedtime. For Depression (Patient taking differently: Take 15 mg by mouth at bedtime. For Depression , appetite) 30 tablet 0   pantoprazole (PROTONIX) 40 MG tablet Take 1 tablet (40 mg total) by mouth daily.     PEG 3350-KCl-NaBcb-NaCl-NaSulf (PEG-3350/ELECTROLYTES) 236 g SOLR SMARTSIG:Milliliter(s) By Mouth     traMADol (ULTRAM) 50 MG tablet Take by mouth every 6 (six) hours as needed.     No current facility-administered medications for this visit.    Review of Systems  Constitutional:  Constitutional negative. HENT: HENT negative.  Eyes: Eyes negative.  Respiratory: Respiratory negative.  Cardiovascular: Positive for leg swelling.  GI: Gastrointestinal negative.  Musculoskeletal: Positive for leg pain.  Skin: Skin negative.  Neurological: Positive for focal weakness.  Hematologic: Hematologic/lymphatic negative.  Psychiatric: Psychiatric negative.       Objective:  Objective  Vitals:   04/22/21 0925  BP: (!) 174/98  Pulse: 64   Resp: 16  Temp: 97.8 F (36.6 C)      Physical Exam HENT:     Head: Normocephalic.     Nose:     Comments: Wearing a mask Eyes:     Pupils: Pupils are equal, round, and reactive to light.  Cardiovascular:     Pulses:          Popliteal pulses are 0 on the right side and 0 on the left side.  Pulmonary:     Effort: Pulmonary effort is normal.  Abdominal:     General: Abdomen is flat.     Palpations: Abdomen is soft.  Musculoskeletal:     Left lower leg: Edema present.     Comments: Well-healed left toes 3-5 amputation site  Skin:    General: Skin is warm.     Capillary Refill: Capillary refill takes 2 to 3 seconds.  Neurological:     Mental Status: She is alert.     Comments: Decreased motor function left upper and lower extremities  Psychiatric:        Mood and Affect: Mood normal.        Behavior: Behavior normal.        Thought Content: Thought content normal.    Data: ABI Findings:  +---------+------------------+-----+-----------+--------+  Right    Rt Pressure (mmHg)IndexWaveform   Comment   +---------+------------------+-----+-----------+--------+  Brachial 200                                         +---------+------------------+-----+-----------+--------+  ATA      203               0.95 biphasic             +---------+------------------+-----+-----------+--------+  PTA      220               1.03 triphasic            +---------+------------------+-----+-----------+--------+  PERO     156               0.73 multiphasic          +---------+------------------+-----+-----------+--------+  [facility]  0.77 Abnormal             +---------+------------------+-----+-----------+--------+   +---------+------------------+-----+-------------------+-------+  Left     Lt Pressure (mmHg)IndexWaveform           Comment  +---------+------------------+-----+-------------------+-------+  Brachial 214                                                 +---------+------------------+-----+-------------------+-------+  ATA      152               0.71 monophasic                  +---------+------------------+-----+-------------------+-------+  PTA      195               0.91 monophasic                  +---------+------------------+-----+-------------------+-------+  PERO     166               0.78 dampened monophasic         +---------+------------------+-----+-------------------+-------+  Great Toe102               0.48 Abnormal                    +---------+------------------+-----+-------------------+-------+   +-------+-----------+-----------+------------+------------+  ABI/TBIToday's ABIToday's TBIPrevious ABIPrevious TBI  +-------+-----------+-----------+------------+------------+  Right  1.03       .77        .93         .65           +-------+-----------+-----------+------------+------------+  Left   .91        .48        .63         .37           +-------+-----------+-----------+------------+------------+     +----------+--------+-----+--------+----------------+----------------+  LEFT      PSV cm/sRatioStenosisWaveform        Comments          +----------+--------+-----+--------+----------------+----------------+  CFA Prox  93                   triphasic                         +----------+--------+-----+--------+----------------+----------------+  CFA Distal55                   triphasic                         +----------+--------+-----+--------+----------------+----------------+  DFA       70                   triphasic                         +----------+--------+-----+--------+----------------+----------------+  SFA Prox  64                   triphasic                         +----------+--------+-----+--------+----------------+----------------+  SFA Mid   65                   triphasic                          +----------+--------+-----+--------+----------------+----------------+  SFA Distal51                   triphasic                         +----------+--------+-----+--------+----------------+----------------+  POP Prox  48                   barely triphasic                  +----------+--------+-----+--------+----------------+----------------+  POP Distal43                   biphasic                          +----------+--------+-----+--------+----------------+----------------+  TP Trunk  27                   biphasic                          +----------+--------+-----+--------+----------------+----------------+  ATA Prox  28                   biphasic                          +----------+--------+-----+--------+----------------+----------------+  ATA Mid   18                   biphasic                          +----------+--------+-----+--------+----------------+----------------+  ATA Distal12                   monophasic      collateral noted  +----------+--------+-----+--------+----------------+----------------+  PTA Prox  52                   triphasic                         +----------+--------+-----+--------+----------------+----------------+  PERO Prox 25                   triphasic                         +----------+--------+-----+--------+----------------+----------------+   Summary:  Left: Mild athereosclerosis noted throughout extremity. Left ATA appears  to have decreased blood flow in the mid to distal portion with a  collateral noted distally.       Assessment/Plan:     50 year old female status post complex endovascular reconstruction left lower extremity to heal toe amputation nonhealing wounds.  Wounds are now healed.  She does have significant swelling of the left greater than right lower extremity this is somewhat related to her motor deficit from previous stroke.  Her pain appears to be in  bilateral calfs and does not appear to be frankly claudication.  I have recommended against any further intervention until she has unbearable pain or wounds.      Waynetta Sandy MD [facility]

## 2021-04-26 ENCOUNTER — Other Ambulatory Visit: Payer: Self-pay

## 2021-04-26 DIAGNOSIS — I779 Disorder of arteries and arterioles, unspecified: Secondary | ICD-10-CM

## 2021-05-22 ENCOUNTER — Other Ambulatory Visit: Payer: Self-pay

## 2021-05-22 ENCOUNTER — Emergency Department
Admission: EM | Admit: 2021-05-22 | Discharge: 2021-05-22 | Disposition: A | Discharge status: Home / Self Care | Payer: Medicare Other | Attending: Emergency Medicine | Admitting: Emergency Medicine

## 2021-05-22 ENCOUNTER — Emergency Department: Payer: Medicare Other

## 2021-05-22 ENCOUNTER — Encounter: Payer: Self-pay | Admitting: Emergency Medicine

## 2021-05-22 DIAGNOSIS — Z87891 Personal history of nicotine dependence: Secondary | ICD-10-CM | POA: Diagnosis not present

## 2021-05-22 DIAGNOSIS — E1140 Type 2 diabetes mellitus with diabetic neuropathy, unspecified: Secondary | ICD-10-CM | POA: Insufficient documentation

## 2021-05-22 DIAGNOSIS — R1011 Right upper quadrant pain: Secondary | ICD-10-CM

## 2021-05-22 DIAGNOSIS — R110 Nausea: Secondary | ICD-10-CM | POA: Diagnosis not present

## 2021-05-22 DIAGNOSIS — N183 Chronic kidney disease, stage 3 unspecified: Secondary | ICD-10-CM | POA: Insufficient documentation

## 2021-05-22 DIAGNOSIS — E1151 Type 2 diabetes mellitus with diabetic peripheral angiopathy without gangrene: Secondary | ICD-10-CM | POA: Diagnosis not present

## 2021-05-22 DIAGNOSIS — N3090 Cystitis, unspecified without hematuria: Secondary | ICD-10-CM | POA: Diagnosis not present

## 2021-05-22 DIAGNOSIS — I129 Hypertensive chronic kidney disease with stage 1 through stage 4 chronic kidney disease, or unspecified chronic kidney disease: Secondary | ICD-10-CM | POA: Diagnosis not present

## 2021-05-22 DIAGNOSIS — R630 Anorexia: Secondary | ICD-10-CM | POA: Diagnosis not present

## 2021-05-22 DIAGNOSIS — Z79899 Other long term (current) drug therapy: Secondary | ICD-10-CM | POA: Diagnosis not present

## 2021-05-22 DIAGNOSIS — J449 Chronic obstructive pulmonary disease, unspecified: Secondary | ICD-10-CM | POA: Diagnosis not present

## 2021-05-22 DIAGNOSIS — Z794 Long term (current) use of insulin: Secondary | ICD-10-CM | POA: Diagnosis not present

## 2021-05-22 DIAGNOSIS — E119 Type 2 diabetes mellitus without complications: Secondary | ICD-10-CM

## 2021-05-22 DIAGNOSIS — Z7982 Long term (current) use of aspirin: Secondary | ICD-10-CM | POA: Insufficient documentation

## 2021-05-22 LAB — URINALYSIS, COMPLETE (UACMP) WITH MICROSCOPIC
Bilirubin Urine: NEGATIVE
Glucose, UA: 50 mg/dL — AB
Hgb urine dipstick: NEGATIVE
Ketones, ur: NEGATIVE mg/dL
Nitrite: NEGATIVE
Protein, ur: >=300 mg/dL — AB
Specific Gravity, Urine: 1.011 (ref 1.005–1.030)
pH: 7 (ref 5.0–8.0)

## 2021-05-22 LAB — COMPREHENSIVE METABOLIC PANEL
ALT: 18 U/L (ref 0–44)
AST: 20 U/L (ref 15–41)
Albumin: 4 g/dL (ref 3.5–5.0)
Alkaline Phosphatase: 86 U/L (ref 38–126)
Anion gap: 11 (ref 5–15)
BUN: 26 mg/dL — ABNORMAL HIGH (ref 6–20)
CO2: 28 mmol/L (ref 22–32)
Calcium: 9.7 mg/dL (ref 8.9–10.3)
Chloride: 99 mmol/L (ref 98–111)
Creatinine, Ser: 1.25 mg/dL — ABNORMAL HIGH (ref 0.44–1.00)
GFR, Estimated: 53 mL/min — ABNORMAL LOW (ref >60)
Glucose, Bld: 163 mg/dL — ABNORMAL HIGH (ref 70–99)
Potassium: 3.9 mmol/L (ref 3.5–5.1)
Sodium: 138 mmol/L (ref 135–145)
Total Bilirubin: 0.6 mg/dL (ref 0.3–1.2)
Total Protein: 8.8 g/dL — ABNORMAL HIGH (ref 6.5–8.1)

## 2021-05-22 LAB — CBC
HCT: 37.8 % (ref 36.0–46.0)
Hemoglobin: 12.1 g/dL (ref 12.0–15.0)
MCH: 23 pg — ABNORMAL LOW (ref 26.0–34.0)
MCHC: 32 g/dL (ref 30.0–36.0)
MCV: 71.7 fL — ABNORMAL LOW (ref 80.0–100.0)
Platelets: 342 10*3/uL (ref 150–400)
RBC: 5.27 MIL/uL — ABNORMAL HIGH (ref 3.87–5.11)
RDW: 16.4 % — ABNORMAL HIGH (ref 11.5–15.5)
WBC: 8.3 10*3/uL (ref 4.0–10.5)
nRBC: 0 % (ref 0.0–0.2)

## 2021-05-22 LAB — LIPASE, BLOOD: Lipase: 37 U/L (ref 11–51)

## 2021-05-22 MED ORDER — KETOROLAC TROMETHAMINE 30 MG/ML IJ SOLN
15.0000 mg | INTRAMUSCULAR | Status: AC
  Administered 2021-05-22: 15 mg via INTRAVENOUS
  Filled 2021-05-22: qty 1

## 2021-05-22 MED ORDER — SULFAMETHOXAZOLE-TRIMETHOPRIM 800-160 MG PO TABS
1.0000 | ORAL_TABLET | Freq: Once | ORAL | Status: AC
  Administered 2021-05-22: 1 via ORAL
  Filled 2021-05-22: qty 1

## 2021-05-22 MED ORDER — SULFAMETHOXAZOLE-TRIMETHOPRIM 800-160 MG PO TABS: 1.0000 | ORAL_TABLET | Freq: Two times a day (BID) | ORAL | 0 refills | Status: DC

## 2021-05-22 MED ORDER — ONDANSETRON HCL 4 MG/2ML IJ SOLN
4.0000 mg | Freq: Once | INTRAMUSCULAR | Status: AC
  Administered 2021-05-22: 4 mg via INTRAVENOUS
  Filled 2021-05-22: qty 2

## 2021-05-22 MED ORDER — IOHEXOL 350 MG/ML SOLN
75.0000 mL | Freq: Once | INTRAVENOUS | Status: AC | PRN
  Administered 2021-05-22: 75 mL via INTRAVENOUS

## 2021-05-22 MED ORDER — SODIUM CHLORIDE 0.9 % IV BOLUS
1000.0000 mL | Freq: Once | INTRAVENOUS | Status: AC
  Administered 2021-05-22: 1000 mL via INTRAVENOUS

## 2021-05-22 NOTE — ED Notes (Signed)
[facility] called with transport request

## 2021-05-22 NOTE — ED Triage Notes (Signed)
Pt reports that she is having pain on her right side. Denies N/V/D. No burning when she urinates. Denies blood in urine, but has been urinating more frequently

## 2021-05-22 NOTE — ED Notes (Signed)
Attempted to call report at [facility] health care [id number] and [phone]. Someone will answer initial call, but no one will answer at [facility] unit.

## 2021-05-22 NOTE — ED Provider Notes (Signed)
[facility] Emergency Department Provider Note  ____________________________________________  Time seen: Approximately 7:28 PM  I have reviewed the triage vital signs and the nursing notes.   HISTORY  Chief Complaint Abdominal Pain and Urinary Frequency    HPI Diamond Rice is a 50 y.o. female with a history of stroke, COPD, hypertension, obesity who comes ED complaining of right upper quadrant abdominal pain for the past 2 days associated with loss of appetite and nausea.  No vomiting diarrhea or constipation.  Pain is severe, nonradiating, no aggravating or alleviating factors.    Past Medical History:  Diagnosis Date   Acute ischemic stroke (HCC) 12/27/2020   Acute renal failure (ARF) (HCC) 04/02/2018   Anxiety    Chronic lower back pain 04/17/2012   "just got over some; catched when I walked"   COPD (chronic obstructive pulmonary disease) (HCC)    Critical lower limb ischemia (HCC) 05/27/2018   Depression    Headache(784.0) 04/17/2012   "~ qod; lately waking up in am w/one"   High cholesterol    Hypertension    Migraines 04/17/2012   Obesity    Sleep apnea    CPAP   Stroke (HCC)    Type II diabetes mellitus (HCC) 08/28/2002     Patient Active Problem List   Diagnosis Date Noted   Acute CVA (cerebrovascular accident) (HCC) 01/18/2021   Leukocytosis 01/17/2021   Dysarthria    Acute lower UTI    Abnormal finding on lung imaging 12/27/2020   Severe episode of recurrent major depressive disorder, with psychotic features (HCC)    Cerebrovascular accident (CVA) (HCC) 12/06/2019   AKI (acute kidney injury) (HCC) 12/06/2019   LVH (left ventricular hypertrophy) 08/10/2019   Edema of lower extremity 11/26/2018   Loss of appetite 11/26/2018   Urinary incontinence 11/26/2018   Allergic rhinitis 11/14/2018   Deep venous thrombosis (HCC) 11/14/2018   Nicotine dependence 11/14/2018   Hemorrhagic stroke (HCC)    Diastolic dysfunction    Type 2  diabetes mellitus (HCC)    Anemia of chronic disease    Chronic obstructive pulmonary disease (HCC)    ICH (intracerebral hemorrhage) (HCC) 08/30/2018   Hypertensive heart disease without heart failure 07/04/2018   Nonrheumatic aortic valve insufficiency 07/04/2018   Esophageal abnormality    Class 2 severe obesity due to excess calories with serious comorbidity and body mass index (BMI) of 35.0 to 35.9 in adult [facility])    Peripheral vascular disease (HCC)    History of osteomyelitis    History of amputation of lesser toe (HCC)    Pulmonary embolism (HCC) 06/22/2018   Microcytic anemia 06/17/2018   At risk for adverse drug reaction 06/11/2018   Critical lower limb ischemia (HCC) 05/27/2018   GI bleeding 04/02/2018   Hypokalemia 04/02/2018   Polysubstance abuse (HCC) 04/02/2018   Diabetic foot ulcer (HCC) 04/02/2018   Tinea pedis 03/07/2018   Diabetic peripheral neuropathy (HCC) 01/24/2018   Cannabis use disorder, moderate, dependence (HCC) 05/19/2017   Cocaine use disorder, moderate, dependence (HCC) 05/19/2017   CKD (chronic kidney disease) stage 3, GFR 30-59 ml/min (HCC) 02/17/2017   Noncompliance with medications 02/17/2017   Suicide attempt (HCC) 11/06/2016   Mild cognitive impairment 03/21/2016   Moderate episode of recurrent major depressive disorder (HCC) 03/21/2016   B12 deficiency 03/09/2016   Iron deficiency anemia 03/07/2016   History of CVA with residual deficit 12/30/2015   Schizoaffective disorder, bipolar type (HCC) 10/07/2015   OSA (obstructive sleep apnea) 06/09/2015  Vitamin D deficiency 01/06/2015   Bilateral knee pain 01/05/2015   Numbness and tingling in right hand 01/05/2015   Insomnia 12/07/2014   Right-sided lacunar infarction ([place]) 09/15/2014   Left hemiparesis ([place]) 09/15/2014   Dyspnea    Back pain 09/11/2013   Psychoactive substance-induced organic mood disorder ([place]) 05/28/2013   Cocaine abuse with cocaine-induced mood disorder ([place]) 05/28/2013    Cannabis dependence with cannabis-induced anxiety disorder ([place]) 05/28/2013   Morbid obesity with BMI of 45.0-49.9, adult ([place]) 04/25/2012   Chest pain 04/17/2012   Hypertension 04/17/2012   DM (diabetes mellitus) with peripheral vascular complication ([place]) 51/70/0174   Hyperlipidemia 04/17/2012   Tobacco use 04/17/2012     Past Surgical History:  Procedure Laterality Date   ABDOMINAL AORTOGRAM W/LOWER EXTREMITY Left 05/27/2018   Procedure: ABDOMINAL AORTOGRAM W/LOWER EXTREMITY Run[place] and Possible Intervention;  Surgeon: Waynetta Sandy, MD;  Location: [place] CV LAB;  Service: Cardiovascular;  Laterality: Left;   APPLICATION OF WOUND VAC Left 06/06/2018   Procedure: APPLICATION OF WOUND VAC;  Surgeon: Waynetta Sandy, MD;  Location: [place];  Service: Vascular;  Laterality: Left;   BALLOON DILATION N/A 12/31/2020   Procedure: [place];  Surgeon: Ronnette Juniper, MD;  Location: [place];  Service: Gastroenterology;  Laterality: N/A;   BIOPSY  04/04/2018   Procedure: BIOPSY;  Surgeon: [name], MD;  Location: [facility] ENDOSCOPY;  Service: Endoscopy;;   BIOPSY  10/27/2020   Procedure: BIOPSY;  Surgeon: Arta Silence, MD;  Location: [facility] ENDOSCOPY;  Service: Endoscopy;;   BOTOX INJECTION N/A 12/31/2020   Procedure: BOTOX INJECTION;  Surgeon: Ronnette Juniper, MD;  Location: [place];  Service: Gastroenterology;  Laterality: N/A;   CARDIAC CATHETERIZATION  ~ 2011   COLONOSCOPY N/A 04/04/2018   Procedure: COLONOSCOPY;  Surgeon: [name], MD;  Location: [facility] ENDOSCOPY;  Service: Endoscopy;  Laterality: N/A;   COLONOSCOPY WITH PROPOFOL N/A 10/27/2020   Procedure: COLONOSCOPY WITH PROPOFOL;  Surgeon: Arta Silence, MD;  Location: [facility] ENDOSCOPY;  Service: Endoscopy;  Laterality: N/A;   ESOPHAGOGASTRODUODENOSCOPY N/A 12/31/2020   Procedure: ESOPHAGOGASTRODUODENOSCOPY (EGD);  Surgeon: Ronnette Juniper, MD;  Location: [place];  Service: Gastroenterology;  Laterality: N/A;    ESOPHAGOGASTRODUODENOSCOPY (EGD) WITH PROPOFOL N/A 10/27/2020   Procedure: ESOPHAGOGASTRODUODENOSCOPY (EGD) WITH PROPOFOL  WITH POSSIBLE DIL;  Surgeon: Arta Silence, MD;  Location: [facility] ENDOSCOPY;  Service: Endoscopy;  Laterality: N/A;   FOREIGN BODY REMOVAL  12/31/2020   Procedure: FOREIGN BODY REMOVAL;  Surgeon: Ronnette Juniper, MD;  Location: [facility] ENDOSCOPY;  Service: Gastroenterology;;   LOWER EXTREMITY ANGIOGRAM Left 05/27/2018   PERIPHERAL VASCULAR INTERVENTION Left 05/27/2018   Procedure: PERIPHERAL VASCULAR INTERVENTION;  Surgeon: Waynetta Sandy, MD;  Location: [place] CV LAB;  Service: Cardiovascular;  Laterality: Left;   POLYPECTOMY  04/04/2018   Procedure: POLYPECTOMY;  Surgeon: [name], MD;  Location: [facility] ENDOSCOPY;  Service: Endoscopy;;   POLYPECTOMY  10/27/2020   Procedure: POLYPECTOMY;  Surgeon: Arta Silence, MD;  Location: [facility] ENDOSCOPY;  Service: Endoscopy;;   TRANSMETATARSAL AMPUTATION Left 05/28/2018   Procedure: AMPUTATION TOES THREE, FOUR AND FIVE on left foot;  Surgeon: Waynetta Sandy, MD;  Location: [place];  Service: Vascular;  Laterality: Left;   WOUND DEBRIDEMENT Left 06/06/2018   Procedure: DEBRIDEMENT WOUND LEFT FOOT;  Surgeon: Waynetta Sandy, MD;  Location: [place];  Service: Vascular;  Laterality: Left;     Prior to Admission medications   Medication Sig Start Date End Date Taking? Authorizing Provider  sulfamethoxazole-trimethoprim (BACTRIM DS) 800-160 MG  tablet Take 1 tablet by mouth 2 (two) times daily. 05/22/21  Yes Carrie Mew, MD  ACCU-CHEK GUIDE test strip 2 (two) times daily. as directed 02/06/20   [provider]  Accu-Chek Softclix Lancets lancets 2 (two) times daily. 02/06/20   [provider]  amLODipine (NORVASC) 10 MG tablet Take 1 tablet (10 mg total) by mouth daily. 01/22/20   Royal Hawthorn, NP  ARIPiprazole (ABILIFY) 5 MG tablet Take 5 mg by mouth daily. 08/28/20   [provider]  aspirin EC  81 MG EC tablet Take 1 tablet (81 mg total) by mouth daily. 09/21/18   Donzetta Starch, NP  atorvastatin (LIPITOR) 40 MG tablet Take 2 tablets (80 mg total) by mouth daily at 6 PM. 01/03/21   Hongalgi, Lenis Dickinson, MD  Blood Glucose Monitoring Suppl (ACCU-CHEK GUIDE) w/Device KIT USE AS DIRECTED TO CHECK BLOOD SUGAR 02/06/20   [provider]  ciprofloxacin (CIPRO) 250 MG tablet Take 1 tablet (250 mg total) by mouth 2 (two) times daily. Patient not taking: Reported on 04/22/2021 01/20/21   Nolberto Hanlon, MD  cloNIDine (CATAPRES) 0.2 MG tablet Take 0.2 mg by mouth 2 (two) times daily. For 7 days 01/11/21 03/03/21  [provider]  diphenhydrAMINE (BENADRYL) 25 MG tablet Take 25 mg by mouth every 6 (six) hours as needed. Patient not taking: Reported on 04/22/2021    [provider]  hydrALAZINE (APRESOLINE) 100 MG tablet Take 1 tablet (100 mg total) by mouth 3 (three) times daily. 01/22/20   Royal Hawthorn, NP  insulin aspart (NOVOLOG) 100 UNIT/ML injection Inject 0-9 Units into the skin 3 (three) times daily with meals. Correction coverage: Sensitive (thin, NPO, renal) CBG < 70: Implement Hypoglycemia protocol. CBG 70 - 120: 0 units CBG 121 - 150: 1 unit CBG 151 - 200: 2 units CBG 201 - 250: 3 units CBG 251 - 300: 5 units CBG 301 - 350: 7 units CBG 351 - 400: 9 units CBG > 400: call MD. 01/03/21   Modena Jansky, MD  insulin glargine (LANTUS) 100 UNIT/ML injection Inject 0.1 mLs (10 Units total) into the skin daily. 01/21/21   Nolberto Hanlon, MD  lisinopril (ZESTRIL) 40 MG tablet Take 1 tablet (40 mg total) by mouth daily. 01/22/20   Royal Hawthorn, NP  metoprolol succinate (TOPROL-XL) 50 MG 24 hr tablet Take 50 mg by mouth every morning. 08/02/20   [provider]  mirtazapine (REMERON) 15 MG tablet Take 1 tablet (15 mg total) by mouth at bedtime. For Depression Patient taking differently: Take 15 mg by mouth at bedtime. For Depression , appetite 01/22/20   Royal Hawthorn, NP   pantoprazole (PROTONIX) 40 MG tablet Take 1 tablet (40 mg total) by mouth daily. 01/03/21   Hongalgi, Lenis Dickinson, MD  PEG 3350-KCl-NaBcb-NaCl-NaSulf (PEG-3350/ELECTROLYTES) 236 g SOLR SMARTSIG:Milliliter(s) By Mouth 01/13/21   [provider]  traMADol (ULTRAM) 50 MG tablet Take by mouth every 6 (six) hours as needed.    [provider]     Allergies Morphine and related and Metformin   Family History  Problem Relation Age of Onset   Stroke Brother 36   Stroke Brother 33   Heart attack Mother 21   Stroke Father 40    Social History Social History   Tobacco Use   Smoking status: Former    Packs/day: 0.25    Years: 28.00    Pack years: 7.00    Types: Cigarettes   Smokeless tobacco: Never  Media planner  Vaping Use: Never used  Substance Use Topics   Alcohol use: No    Alcohol/week: 0.0 standard drinks    Comment: rare   Drug use: Yes    Types: Marijuana, "Crack" cocaine    Comment: marijuana last use days ago; last cocaine several months ago    Review of Systems  Constitutional:   No fever or chills.  ENT:   No sore throat. No rhinorrhea. Cardiovascular:   No chest pain or syncope. Respiratory:   No dyspnea or cough. Gastrointestinal: Positive as above for abdominal pain without vomiting and diarrhea.  Musculoskeletal:   Negative for focal pain or swelling All other systems reviewed and are negative except as documented above in ROS and HPI.  ____________________________________________   PHYSICAL EXAM:  VITAL SIGNS: ED Triage Vitals  Enc Vitals Group     BP 05/22/21 1655 (!) 188/107     Pulse Rate 05/22/21 1655 77     Resp 05/22/21 1655 18     Temp 05/22/21 1655 98.5 F (36.9 C)     Temp Source 05/22/21 1655 Oral     SpO2 05/22/21 1655 100 %     Weight 05/22/21 1656 230 lb (104.3 kg)     Height 05/22/21 1656 $RemoveBefor'5\' 1"'hgQTshWOTAfY$  (1.549 m)     Head Circumference --      Peak Flow --      Pain Score 05/22/21 1655 10     Pain Loc --      Pain Edu? --       Excl. in [place]? --     Vital signs reviewed, nursing assessments reviewed.   Constitutional:   Alert and oriented. Non-toxic appearance. Eyes:   Conjunctivae are normal. EOMI. PERRL. ENT      Head:   Normocephalic and atraumatic.      Nose:   Normal.      Mouth/Throat:   Dry mucous membranes.      Neck:   No meningismus. Full ROM. Hematological/Lymphatic/Immunilogical:   No cervical lymphadenopathy. Cardiovascular:   RRR. Symmetric bilateral radial and DP pulses.  No murmurs. Cap refill less than 2 seconds. Respiratory:   Normal respiratory effort without tachypnea/retractions. Breath sounds are clear and equal bilaterally. No wheezes/rales/rhonchi. Gastrointestinal:   Soft with right upper quadrant tenderness. Non distended. There is no CVA tenderness.  No rebound, rigidity, or guarding. Genitourinary:   deferred Musculoskeletal:   Normal range of motion in all extremities. No joint effusions.  No lower extremity tenderness.  No edema. Neurologic:   Normal speech and language.  Motor grossly intact. No acute focal neurologic deficits are appreciated.  Skin:    Skin is warm, dry and intact. No rash noted.  No petechiae, purpura, or bullae.  ____________________________________________    LABS (pertinent positives/negatives) (all labs ordered are listed, but only abnormal results are displayed) Labs Reviewed  COMPREHENSIVE METABOLIC PANEL - Abnormal; Notable for the following components:      Result Value   Glucose, Bld 163 (*)    BUN 26 (*)    Creatinine, Ser 1.25 (*)    Total Protein 8.8 (*)    GFR, Estimated 53 (*)    All other components within normal limits  CBC - Abnormal; Notable for the following components:   RBC 5.27 (*)    MCV 71.7 (*)    MCH 23.0 (*)    RDW 16.4 (*)    All other components within normal limits  URINALYSIS, COMPLETE (UACMP) WITH MICROSCOPIC - Abnormal; Notable for the  following components:   Color, Urine STRAW (*)    APPearance HAZY (*)     Glucose, UA 50 (*)    Protein, ur >=300 (*)    Leukocytes,Ua TRACE (*)    Bacteria, UA RARE (*)    All other components within normal limits  RESP PANEL BY RT-PCR (FLU A&B, COVID) ARPGX2  LIPASE, BLOOD   ____________________________________________   EKG    ____________________________________________    RADIOLOGY  CT ABDOMEN PELVIS W CONTRAST  Result Date: 05/22/2021 CLINICAL DATA:  Abdomen pain EXAM: CT ABDOMEN AND PELVIS WITH CONTRAST TECHNIQUE: Multidetector CT imaging of the abdomen and pelvis was performed using the standard protocol following bolus administration of intravenous contrast. CONTRAST:  80 mL OMNIPAQUE IOHEXOL 350 MG/ML SOLN COMPARISON:  CT 05/22/2018 FINDINGS: Lower chest: Lung bases demonstrate no acute consolidation or effusion. Mild cardiomegaly. Small fluid-filled hiatal hernia Hepatobiliary: No focal liver abnormality is seen. No gallstones, gallbladder wall thickening, or biliary dilatation. Pancreas: Unremarkable. No pancreatic ductal dilatation or surrounding inflammatory changes. Spleen: Normal in size without focal abnormality. Adrenals/Urinary Tract: Adrenal glands are normal. Kidneys show no hydronephrosis. Slightly thick-walled urinary bladder but nearly empty Stomach/Bowel: Stomach nonenlarged. No dilated small bowel. Negative appendix. Vascular/Lymphatic: Mild aortic atherosclerosis. No aneurysm. No suspicious nodes Reproductive: Lobulated uterus consistent with fibroids.  No adnexal Other: No free air or pelvic effusion. Mild retroperitoneal fluid and stranding on the right, anterior to the psoas. Possibly centered over the right ureter. Musculoskeletal: No acute osseous abnormality. Soft tissue inflammation anterior to the right iliopsoas. IMPRESSION: 1. Negative for acute appendicitis or hydronephrosis. 2. Mild stranding and fluid anterior to the right iliopsoas muscle of uncertain source. This may be centered about the right ureter and could be secondary  to ascending urinary tract infection given slightly thick-walled appearance of the bladder, correlate with urinalysis. Other potential source could be muscle inflammatory process but there is no muscle enlargement or definitive rim enhancing collections to suggest soft tissue infection. 3. Fibroid uterus 4. Cardiomegaly Electronically Signed   By: Donavan Foil M.D.   On: 05/22/2021 22:04   US Abdomen Limited RUQ (LIVER/GB)  Result Date: 05/22/2021 CLINICAL DATA:  Right upper quadrant pain EXAM: ULTRASOUND ABDOMEN LIMITED RIGHT UPPER QUADRANT COMPARISON:  None. FINDINGS: Gallbladder: No gallstones or wall thickening visualized. No sonographic Murphy sign noted by sonographer. Common bile duct: Diameter: Normal caliber, 5 mm Liver: No focal lesion identified. Within normal limits in parenchymal echogenicity. Portal vein is patent on color Doppler imaging with normal direction of blood flow towards the liver. Other: None. IMPRESSION: Normal right upper quadrant ultrasound. Electronically Signed   By: Rolm Baptise M.D.   On: 05/22/2021 19:55    ____________________________________________   PROCEDURES Procedures  ____________________________________________  DIFFERENTIAL DIAGNOSIS   Cholecystitis, pancreatitis, appendicitis, ureterolithiasis, diverticulitis, bowel obstruction, viral illness  CLINICAL IMPRESSION / ASSESSMENT AND PLAN / ED COURSE  Medications ordered in the ED: Medications  sulfamethoxazole-trimethoprim (BACTRIM DS) 800-160 MG per tablet 1 tablet (has no administration in time range)  sodium chloride 0.9 % bolus 1,000 mL (0 mLs Intravenous Stopping Infusion hung by another clincian 05/22/21 2103)  ondansetron (ZOFRAN) injection 4 mg (4 mg Intravenous Given 05/22/21 1900)  ketorolac (TORADOL) 30 MG/ML injection 15 mg (15 mg Intravenous Given 05/22/21 1900)  iohexol (OMNIPAQUE) 350 MG/ML injection 75 mL (75 mLs Intravenous Contrast Given 05/22/21 2118)    Pertinent labs & imaging  results that were available during my care of the patient were reviewed by me and considered in  my medical decision making (see chart for details).  Arlone Lenhardt was evaluated in Emergency Department on 05/22/2021 for the symptoms described in the history of present illness. She was evaluated in the context of the global COVID-19 pandemic, which necessitated consideration that the patient might be at risk for infection with the SARS-CoV-2 virus that causes COVID-19. Institutional protocols and algorithms that pertain to the evaluation of patients at risk for COVID-19 are in a state of rapid change based on information released by regulatory bodies including the CDC and federal and state organizations. These policies and algorithms were followed during the patient's care in the ED.   Patient presents with right upper quadrant abdominal pain, overall unremarkable vital signs.  Exam shows right upper quadrant tenderness and a degree of dehydration.  Will give IV fluids, Zofran and Toradol while obtaining ultrasound of right upper quadrant.  If ultrasound is nondiagnostic, would proceed with CT scan of the abdomen and pelvis due to her obesity and multiple comorbidities.  ----------------------------------------- 10:19 PM on 05/22/2021 ----------------------------------------- Ultrasound was unremarkable so CT scan was obtained.  This shows findings consistent with cystitis and no other acute findings, normal-appearing appendix.  Reviewing prior microbiology data from positive urine cultures shows resistance to penicillins and cefazolin.  Patient was also recently treated with Cipro.  I will prescribe Bactrim, and she will need to follow-up for electrolyte recheck and reassessment due to diabetes and lisinopril use.  Vital signs remain normal, she is nontoxic, she is stable for outpatient management.      ____________________________________________   FINAL CLINICAL IMPRESSION(S) / ED  DIAGNOSES    Final diagnoses:  RUQ pain  Cystitis  Type 2 diabetes mellitus without complication, with long-term current use of insulin ([place])  Morbid obesity [facility])     ED Discharge Orders          Ordered    sulfamethoxazole-trimethoprim (BACTRIM DS) 800-160 MG tablet  2 times daily        05/22/21 2218            Portions of this note were generated with dragon dictation software. Dictation errors may occur despite best attempts at proofreading.    Carrie Mew, MD 05/22/21 2221

## 2021-05-22 NOTE — Discharge Instructions (Signed)
Your labs show a bladder infection.  Your ultrasound of your gallbladder and CT scan of your abdomen did not show any other acute issues.  Take the Bactrim antibiotic as prescribed to treat this infection.  You should follow-up with your primary care doctor within 1 week for reassessment of your symptoms and recheck of your electrolyte levels.

## 2021-05-25 ENCOUNTER — Ambulatory Visit: Payer: Medicare Other | Admitting: Vascular Surgery

## 2021-06-09 ENCOUNTER — Ambulatory Visit: Payer: Medicare Other | Admitting: Adult Health

## 2021-06-09 ENCOUNTER — Encounter: Payer: Self-pay | Admitting: Adult Health

## 2021-06-09 NOTE — Progress Notes (Deleted)
[facility] [address] [place]. [place] [address] [phone]       HOSPITAL FOLLOW UP NOTE  [name] Date of Birth:  May 18, 1971 Medical Record Number:  [id number]   Reason for Referral:  hospital stroke follow up    SUBJECTIVE:   CHIEF COMPLAINT:  No chief complaint on file.   HPI:   [name] is a 50 y.o. female with history of PE (was on Xarelto - d/c'd after ICH), hypertension, PVD, diabetes, HLD, obesity, migraine, COPD, CKD, history of stroke and ICH admitted on 12/27/2020 for worsening slurred speech, and difficulty walking in setting of left CR infarct likely due to small vessel disease source in setting of uncontrolled risk factors including HTN and HLD.  CTA head/neck negative LVO.  EF 60 to 65%.  LDL 136.  A1c 6.6.  UDS positive for THC.  Advised continuation of aspirin as well as increase home dose atorvastatin from 40 mg to 80 mg daily.  DAPT not recommended due to history of ICH and uncontrolled HTN.  History of prior strokes 3 ischemic and 1 hemorrhagic with chronic left-sided deficits from hemorrhagic stroke with most recent ischemic stroke 11/2019.  Initially evaluated by therapies and recommended CIR but deemed not appropriate as she was homeless and eventually discharged to SNF. Episode of worsening of speech and all extremities weaker than baseline on 5/5 with MRI negative for acute change and likely in setting of recrudescence of old strokes in setting of anxiety and depression versus conversion disorder.     Initial visit 03/03/2021 JM: [name] is being seen for hospital follow-up unaccompanied.  She is currently residing at [facility].  She is currently receiving therapies for continued increased left-sided weakness compared to baseline and dysarthria but does report continued improvement.  She reports improvement of right-sided weakness.  She is able to ambulate short distance during therapy sessions  otherwise transfers via wheelchair.  Review of facility [facility] confirms current use of aspirin and atorvastatin 40 mg daily.  Blood pressure today 137/85.  She reports blood pressures and glucose levels at facility have been stable but unable to provide levels. Per review of epic, she was diagnosed with COVID-19 beginning of May (at [facility]) as well as hospital admission on 01/16/2021 -01/20/2021 with left facial weakness, right-sided numbness, slurred speech and expressive aphasia likely recrudescence of prior stroke with UTI with significant improvement after treatment for UTI with CT, CTA and MRI brain negative.  She has not had any additional or new stroke/TIA symptoms since that time.  No further concerns at this time.  Update 06/09/2021 JM: Returns for 71-month stroke follow-up.       ROS:   14 system review of systems performed and negative with exception of those listed in HPI  PMH:  Past Medical History:  Diagnosis Date   Acute ischemic stroke (HCC) 12/27/2020   Acute renal failure (ARF) (HCC) 04/02/2018   Anxiety    Chronic lower back pain 04/17/2012   "just got over some; catched when I walked"   COPD (chronic obstructive pulmonary disease) (HCC)    Critical lower limb ischemia (HCC) 05/27/2018   Depression    Headache(784.0) 04/17/2012   "~ qod; lately waking up in am w/one"   High cholesterol    Hypertension    Migraines 04/17/2012   Obesity    Sleep apnea    CPAP   Stroke (HCC)    Type II diabetes mellitus (HCC) 08/28/2002  PSH:  Past Surgical History:  Procedure Laterality Date   ABDOMINAL AORTOGRAM W/LOWER EXTREMITY Left 05/27/2018   Procedure: ABDOMINAL AORTOGRAM W/LOWER EXTREMITY Runoff and Possible Intervention;  Surgeon: Waynetta Sandy, MD;  Location: [facility];  Service: Cardiovascular;  Laterality: Left;   APPLICATION OF WOUND VAC Left 06/06/2018   Procedure: APPLICATION OF WOUND VAC;  Surgeon: Waynetta Sandy, MD;  Location: [place];  Service:  Vascular;  Laterality: Left;   BALLOON DILATION N/A 12/31/2020   Procedure: [place];  Surgeon: Ronnette Juniper, MD;  Location: [place];  Service: Gastroenterology;  Laterality: N/A;   BIOPSY  04/04/2018   Procedure: BIOPSY;  Surgeon: [name], MD;  Location: [facility] ENDOSCOPY;  Service: Endoscopy;;   BIOPSY  10/27/2020   Procedure: BIOPSY;  Surgeon: Arta Silence, MD;  Location: [facility]COPY;  Service: Endoscopy;;   BOTOX INJECTION N/A 12/31/2020   Procedure: BOTOX INJECTION;  Surgeon: Ronnette Juniper, MD;  Location: [place];  Service: Gastroenterology;  Laterality: N/A;   CARDIAC CATHETERIZATION  ~ 2011   COLONOSCOPY N/A 04/04/2018   Procedure: COLONOSCOPY;  Surgeon: [name], MD;  Location: [facility] ENDOSCOPY;  Service: Endoscopy;  Laterality: N/A;   COLONOSCOPY WITH PROPOFOL N/A 10/27/2020   Procedure: COLONOSCOPY WITH PROPOFOL;  Surgeon: Arta Silence, MD;  Location: [facility];  Service: Endoscopy;  Laterality: N/A;   ESOPHAGOGASTRODUODENOSCOPY N/A 12/31/2020   Procedure: ESOPHAGOGASTRODUODENOSCOPY (EGD);  Surgeon: Ronnette Juniper, MD;  Location: [place];  Service: Gastroenterology;  Laterality: N/A;   ESOPHAGOGASTRODUODENOSCOPY (EGD) WITH PROPOFOL N/A 10/27/2020   Procedure: ESOPHAGOGASTRODUODENOSCOPY (EGD) WITH PROPOFOL  WITH POSSIBLE DIL;  Surgeon: Arta Silence, MD;  Location: [facility];  Service: Endoscopy;  Laterality: N/A;   FOREIGN BODY REMOVAL  12/31/2020   Procedure: FOREIGN BODY REMOVAL;  Surgeon: Ronnette Juniper, MD;  Location: [facility] ENDOSCOPY;  Service: Gastroenterology;;   LOWER EXTREMITY ANGIOGRAM Left 05/27/2018   PERIPHERAL VASCULAR INTERVENTION Left 05/27/2018   Procedure: PERIPHERAL VASCULAR INTERVENTION;  Surgeon: Waynetta Sandy, MD;  Location: [place] [facility];  Service: Cardiovascular;  Laterality: Left;   POLYPECTOMY  04/04/2018   Procedure: POLYPECTOMY;  Surgeon: [name], MD;  Location: [facility] ENDOSCOPY;  Service: Endoscopy;;   POLYPECTOMY  10/27/2020    Procedure: POLYPECTOMY;  Surgeon: Arta Silence, MD;  Location: [facility];  Service: Endoscopy;;   TRANSMETATARSAL AMPUTATION Left 05/28/2018   Procedure: AMPUTATION TOES THREE, FOUR AND FIVE on left foot;  Surgeon: Waynetta Sandy, MD;  Location: [facility];  Service: Vascular;  Laterality: Left;   WOUND DEBRIDEMENT Left 06/06/2018   Procedure: DEBRIDEMENT WOUND LEFT FOOT;  Surgeon: Waynetta Sandy, MD;  Location: [place];  Service: Vascular;  Laterality: Left;    Social History:  Social History   Socioeconomic History   Marital status: Single    Spouse name: Not on file   Number of children: 0   Years of education: Not on file   Highest education level: Not on file  Occupational History   Occupation: disabled  Tobacco Use   Smoking status: Former    Packs/day: 0.25    Years: 28.00    Pack years: 7.00    Types: Cigarettes   Smokeless tobacco: Never  Vaping Use   Vaping Use: Never used  Substance and Sexual Activity   Alcohol use: No    Alcohol/week: 0.0 standard drinks    Comment: rare   Drug use: Yes    Types: Marijuana, "Crack" cocaine    Comment: marijuana last use days ago; last  cocaine several months ago   Sexual activity: Not Currently    Birth control/protection: None  Other Topics Concern   Not on file  Social History Narrative   Was a  Quarry manager at [employer]. Lives with godmother.   03/03/21 residing at [facility] since 01/03/21     Social Determinants of Health   Financial Resource Strain: Not on file  Food Insecurity: Not on file  Transportation Needs: Not on file  Physical Activity: Not on file  Stress: Not on file  Social Connections: Not on file  Intimate Partner Violence: Not on file    Family History:  Family History  Problem Relation Age of Onset   Stroke Brother 66   Stroke Brother 32   Heart attack Mother 29   Stroke Father 57    Medications:   Current Outpatient Medications on File Prior to Visit  Medication  Sig Dispense Refill   ACCU-CHEK GUIDE test strip 2 (two) times daily. as directed     Accu-Chek Softclix Lancets lancets 2 (two) times daily.     amLODipine (NORVASC) 10 MG tablet Take 1 tablet (10 mg total) by mouth daily. 30 tablet 0   ARIPiprazole (ABILIFY) 5 MG tablet Take 5 mg by mouth daily.     aspirin EC 81 MG EC tablet Take 1 tablet (81 mg total) by mouth daily.     atorvastatin (LIPITOR) 40 MG tablet Take 2 tablets (80 mg total) by mouth daily at 6 PM.     Blood Glucose Monitoring Suppl (ACCU-CHEK GUIDE) w/Device KIT USE AS DIRECTED TO CHECK BLOOD SUGAR     ciprofloxacin (CIPRO) 250 MG tablet Take 1 tablet (250 mg total) by mouth 2 (two) times daily. (Patient not taking: Reported on 04/22/2021)     cloNIDine (CATAPRES) 0.2 MG tablet Take 0.2 mg by mouth 2 (two) times daily. For 7 days     diphenhydrAMINE (BENADRYL) 25 MG tablet Take 25 mg by mouth every 6 (six) hours as needed. (Patient not taking: Reported on 04/22/2021)     hydrALAZINE (APRESOLINE) 100 MG tablet Take 1 tablet (100 mg total) by mouth 3 (three) times daily. 90 tablet 0   insulin aspart (NOVOLOG) 100 UNIT/ML injection Inject 0-9 Units into the skin 3 (three) times daily with meals. Correction coverage: Sensitive (thin, NPO, renal) CBG < 70: Implement Hypoglycemia protocol. CBG 70 - 120: 0 units CBG 121 - 150: 1 unit CBG 151 - 200: 2 units CBG 201 - 250: 3 units CBG 251 - 300: 5 units CBG 301 - 350: 7 units CBG 351 - 400: 9 units CBG > 400: call MD.     insulin glargine (LANTUS) 100 UNIT/ML injection Inject 0.1 mLs (10 Units total) into the skin daily. 10 mL 0   lisinopril (ZESTRIL) 40 MG tablet Take 1 tablet (40 mg total) by mouth daily. 30 tablet 0   metoprolol succinate (TOPROL-XL) 50 MG 24 hr tablet Take 50 mg by mouth every morning.     mirtazapine (REMERON) 15 MG tablet Take 1 tablet (15 mg total) by mouth at bedtime. For Depression (Patient taking differently: Take 15 mg by mouth at bedtime. For Depression ,  appetite) 30 tablet 0   pantoprazole (PROTONIX) 40 MG tablet Take 1 tablet (40 mg total) by mouth daily.     PEG 3350-KCl-NaBcb-NaCl-NaSulf (PEG-3350/ELECTROLYTES) 236 g SOLR SMARTSIG:Milliliter(s) By Mouth     sulfamethoxazole-trimethoprim (BACTRIM DS) 800-160 MG tablet Take 1 tablet by mouth 2 (two) times daily. 14 tablet  0   traMADol (ULTRAM) 50 MG tablet Take by mouth every 6 (six) hours as needed.     No current facility-administered medications on file prior to visit.    Allergies:   Allergies  Allergen Reactions   Morphine And Related Hives   Metformin Diarrhea and Other (See Comments)    "Allergic," per MAR      OBJECTIVE:  Physical Exam  There were no vitals filed for this visit.  There is no height or weight on file to calculate BMI. No results found.   General: well developed, well nourished, middle-age African-American female, seated, in no evident distress Head: head normocephalic and atraumatic.   Neck: supple with no carotid or supraclavicular bruits Cardiovascular: regular rate and rhythm, no murmurs Musculoskeletal: no deformity Skin:  no rash/petichiae Vascular:  Normal pulses all extremities   Neurologic Exam Mental Status: Awake and fully alert.  Mild dysarthria and hypophonia.  No evidence of aphasia.  Oriented to place and time. Recent and remote memory diminished. Attention span, concentration and fund of knowledge impaired. Mood and affect appropriate.  Cranial Nerves: Pupils equal, briskly reactive to light. Extraocular movements full without nystagmus. Visual fields full to confrontation. Hearing intact. Facial sensation intact.  Left lower facial weakness.  Tongue, and palate moves normally and symmetrically.  Motor: Normal strength, bulk and tone on right side.  Mild chronic left hemiparesis with LUE spasticity Sensory.:  Decreased light touch sensation LUE and LLE.  Coordination: Rapid alternating movements normal in all extremities on right  side. Finger-to-nose and heel-to-shin performed accurately on right side. Gait and Station: Deferred Reflexes: 2+ LUE and LLE; 1+ RUE and RLE. Toes downgoing.         ASSESSMENT: Diamond Rice is a 50 y.o. year old female with recent left CR infarct likely secondary to small vessel disease with uncontrolled risk factors of HTN and HLD on 12/27/2020.  Vascular risk factors include history of prior strokes including both ischemic and ICH with residual left hemiparesis and dysarthria, HTN, HLD, DM, PAOD s/p LLE reconstruction to heal with toe medication noncompliance, history of PE, migraines and substance abuse.      PLAN:  L CR stroke :  Residual deficit: Mild left hemiparesis with LUE spasticity, mild dysarthria and left facial weakness.  Encouraged continued participation with therapies at [facility] for hopeful further recovery.   Continue aspirin 81 mg daily  and atorvastatin 80 mg daily for secondary stroke prevention.  Discussed secondary stroke prevention measures and importance of close PCP follow up for aggressive stroke risk factor management  HTN: BP goal <130/90.  Stable today on current regimen monitored by facility HLD: LDL goal <70.  Prior LDL 136 -continue atorvastatin 80 mg daily currently managed by SNF as well as routine monitoring of lipid panel DMII: A1c goal<7.0.  Prior A1c 6.6 routinely monitored by SNF.  PAOD: Routinely followed by vascular surgery Dr. Donzetta Matters    Follow up in 6 months or call earlier if needed   CC:  [place] provider: Dr. Leonie Man PCP: System, Provider Not In    I spent 43 minutes of face-to-face and non-face-to-face time with patient.  This included previsit chart review, lab review, study review, order entry, electronic health record documentation, and patient discussion and education regarding prior stroke including etiology, secondary stroke prevention measures and aggressive stroke risk factor management, residual deficits and typical recovery time and  answered all questions to patient satisfaction  Frann Rider, AGNP-BC  [facility] [address]  [place], [place] [address]  Phone [phone] Fax [phone] Note: This document was prepared with digital dictation and possible smart phrase technology. Any transcriptional errors that result from this process are unintentional.

## 2021-06-23 ENCOUNTER — Ambulatory Visit: Payer: Medicare Other | Admitting: Adult Health

## 2021-06-27 ENCOUNTER — Ambulatory Visit (INDEPENDENT_AMBULATORY_CARE_PROVIDER_SITE_OTHER): Payer: Medicare Other | Admitting: Adult Health

## 2021-06-27 ENCOUNTER — Encounter: Payer: Self-pay | Admitting: Adult Health

## 2021-06-27 ENCOUNTER — Ambulatory Visit: Payer: Medicare Other | Admitting: Adult Health

## 2021-06-27 ENCOUNTER — Other Ambulatory Visit: Payer: Self-pay

## 2021-06-27 VITALS — BP 142/94 | HR 68

## 2021-06-27 DIAGNOSIS — Z8673 Personal history of transient ischemic attack (TIA), and cerebral infarction without residual deficits: Secondary | ICD-10-CM | POA: Diagnosis not present

## 2021-06-27 DIAGNOSIS — Z794 Long term (current) use of insulin: Secondary | ICD-10-CM

## 2021-06-27 DIAGNOSIS — G8114 Spastic hemiplegia affecting left nondominant side: Secondary | ICD-10-CM

## 2021-06-27 DIAGNOSIS — I639 Cerebral infarction, unspecified: Secondary | ICD-10-CM

## 2021-06-27 DIAGNOSIS — I10 Essential (primary) hypertension: Secondary | ICD-10-CM

## 2021-06-27 DIAGNOSIS — E1165 Type 2 diabetes mellitus with hyperglycemia: Secondary | ICD-10-CM

## 2021-06-27 DIAGNOSIS — E785 Hyperlipidemia, unspecified: Secondary | ICD-10-CM

## 2021-06-27 NOTE — Patient Instructions (Addendum)
No changes today, continue current treatment regimen  Elevated blood pressure today - please ensure this is routinely monitored at facility  Please ensure routine monitoring of HTN, HLD and DM at facility     Followup in the future with me in 6 months or call earlier if needed       Thank you for coming to see [place] at [facility]. I hope we have been able to provide you high quality care today.  You may receive a patient satisfaction survey over the next few weeks. We would appreciate your feedback and comments so that we may continue to improve ourselves and the health of our patients.

## 2021-06-27 NOTE — Progress Notes (Signed)
[facility] [address] [place]. [place] [address] [phone]       STROKE FOLLOW UP NOTE  [name] Date of Birth:  [birth year] Medical Record Number:  [id number]   Reason for Referral: stroke follow up    SUBJECTIVE:   CHIEF COMPLAINT:  Chief Complaint  Patient presents with   Follow-up    Rm 2 alone Pt is well and stable, still having L sided weakness no new concerns      HPI:    Update 06/09/2021 JM: Returns for 42-monthstroke follow-up unaccompanied.  [facility]  Overall stable without new stroke/TIA symptoms.  Residual left sided weakness with spasticity slightly worse from baseline chronic left hemiparesis - denies much improvement since prior visit. Recently restarted working with therapies.  [facility] aspirin and atorvastatin 40 mg daily. Blood pressure today initially 170/100 and on recheck 142/94.  Not recently monitored at SNF per patient. Glucose levels fluctuate per pt - managed by SNF. Labs routinely monitored by facility.  Continue nightly use of CPAP.  No new concerns at this time.    History provided for reference purposes only Initial visit 03/03/2021 JM: [name] being seen for hospital follow-up unaccompanied.  [facility]  She is currently receiving therapies for continued increased left-sided weakness compared to baseline and dysarthria but does report continued improvement.  She reports improvement of right-sided weakness.  She is able to ambulate short distance during therapy sessions otherwise transfers via wheelchair.  [facility] mg [facility].  Blood pressure today 137/85.  She reports blood pressures and glucose levels at facility have been stable but unable to provide levels. Per review of epic, she was [facility] with [facility] as well as  hospital admission on 01/16/2021 -01/20/2021 with left facial weakness, right-sided numbness, slurred speech and expressive aphasia likely recrudescence of prior stroke with UTI with significant improvement after treatment for UTI with CT, CTA and MRI brain negative.  She has not had any additional or new stroke/TIA symptoms since that time.  No further concerns at this time.  Stroke admission 12/27/2020 [name] a 50y.o. female with history of PE (was on Xarelto - d/c'd after ICH), hypertension, PVD, diabetes, HLD, obesity, migraine, COPD, CKD, history of stroke and ICH with residual left sided weakness admitted on 12/27/2020 for worsening slurred speech, and difficulty walking in setting of left CR infarct likely due to small vessel disease source in setting of uncontrolled risk factors including HTN and HLD.  CTA head/neck negative LVO.  EF 60 to 65%.  LDL 136.  A1c 6.6.  UDS positive for THC.  Advised continuation of aspirin as well as increase home dose atorvastatin from 40 mg to 80 mg daily.  DAPT not recommended due to history of ICH and uncontrolled HTN.  History of prior strokes 3 ischemic and 1 hemorrhagic with chronic left-sided deficits from hemorrhagic stroke with most recent ischemic stroke 11/2019.  Initially evaluated by therapies and recommended CIR but deemed not appropriate as she was homeless and eventually discharged to SNF. Episode of worsening of speech and all extremities weaker than baseline on 5/5 with MRI negative for acute change and likely in setting of recrudescence of old strokes in setting of anxiety and depression versus conversion disorder.      ROS:  14 system review of systems performed and negative with exception of those listed in HPI  PMH:  Past Medical History:  Diagnosis Date   Acute ischemic stroke ([place]) 12/27/2020   Acute renal failure (ARF) ([place]) 04/02/2018   Anxiety    Chronic lower back pain 04/17/2012   "just got over some; catched when I  walked"   COPD (chronic obstructive pulmonary disease) ([place])    Critical lower limb ischemia ([place]) 05/27/2018   Depression    Headache(784.0) 04/17/2012   "~ qod; lately waking up in am w/one"   High cholesterol    Hypertension    Migraines 04/17/2012   Obesity    Sleep apnea    CPAP   Stroke ([place])    Type II diabetes mellitus ([place]) 08/28/2002    PSH:  Past Surgical History:  Procedure Laterality Date   ABDOMINAL AORTOGRAM W/LOWER EXTREMITY Left 05/27/2018   Procedure: ABDOMINAL AORTOGRAM W/LOWER EXTREMITY Runoff and Possible Intervention;  Surgeon: Waynetta Sandy, MD;  Location: [place] [facility];  Service: Cardiovascular;  Laterality: Left;   APPLICATION OF WOUND VAC Left 06/06/2018   Procedure: APPLICATION OF WOUND VAC;  Surgeon: Waynetta Sandy, MD;  Location: [place];  Service: Vascular;  Laterality: Left;   BALLOON DILATION N/A 12/31/2020   Procedure: [place];  Surgeon: Ronnette Juniper, MD;  Location: [place];  Service: Gastroenterology;  Laterality: N/A;   BIOPSY  04/04/2018   Procedure: BIOPSY;  Surgeon: [name], MD;  [facility];  Service: Endoscopy;;   BIOPSY  10/27/2020   Procedure: BIOPSY;  Surgeon: Arta Silence, MD;  Location: [facility] ENDOSCOPY;  Service: Endoscopy;;   BOTOX INJECTION N/A 12/31/2020   Procedure: BOTOX INJECTION;  Surgeon: Ronnette Juniper, MD;  Location: [place];  Service: Gastroenterology;  Laterality: N/A;   CARDIAC CATHETERIZATION  ~ 2011   COLONOSCOPY N/A 04/04/2018   Procedure: COLONOSCOPY;  Surgeon: [name], MD;  Location: [facility] ENDOSCOPY;  Service: Endoscopy;  Laterality: N/A;   COLONOSCOPY WITH PROPOFOL N/A 10/27/2020   Procedure: COLONOSCOPY WITH PROPOFOL;  Surgeon: Arta Silence, MD;  Location: [facility] ENDOSCOPY;  Service: Endoscopy;  Laterality: N/A;   ESOPHAGOGASTRODUODENOSCOPY N/A 12/31/2020   Procedure: ESOPHAGOGASTRODUODENOSCOPY (EGD);  Surgeon: Ronnette Juniper, MD;  Location: [place];  Service:  Gastroenterology;  Laterality: N/A;   ESOPHAGOGASTRODUODENOSCOPY (EGD) WITH PROPOFOL N/A 10/27/2020   Procedure: ESOPHAGOGASTRODUODENOSCOPY (EGD) WITH PROPOFOL  WITH POSSIBLE DIL;  Surgeon: Arta Silence, MD;  Location: [facility] ENDOSCOPY;  Service: Endoscopy;  Laterality: N/A;   FOREIGN BODY REMOVAL  12/31/2020   Procedure: FOREIGN BODY REMOVAL;  Surgeon: Ronnette Juniper, MD;  Location: [facility] ENDOSCOPY;  Service: Gastroenterology;;   LOWER EXTREMITY ANGIOGRAM Left 05/27/2018   PERIPHERAL VASCULAR INTERVENTION Left 05/27/2018   Procedure: PERIPHERAL VASCULAR INTERVENTION;  Surgeon: Waynetta Sandy, MD;  Location: [place] [facility];  Service: Cardiovascular;  Laterality: Left;   POLYPECTOMY  04/04/2018   Procedure: POLYPECTOMY;  Surgeon: [name], MD;  Location: [facility] ENDOSCOPY;  Service: Endoscopy;;   POLYPECTOMY  10/27/2020   Procedure: POLYPECTOMY;  Surgeon: Arta Silence, MD;  Location: [facility] ENDOSCOPY;  Service: Endoscopy;;   TRANSMETATARSAL AMPUTATION Left 05/28/2018   Procedure: AMPUTATION TOES THREE, FOUR AND FIVE on left foot;  Surgeon: Waynetta Sandy, MD;  Location: [place];  Service: Vascular;  Laterality: Left;   WOUND DEBRIDEMENT Left 06/06/2018   Procedure: DEBRIDEMENT WOUND LEFT FOOT;  Surgeon: Waynetta Sandy, MD;  Location: [place];  Service: Vascular;  Laterality: Left;    Social History:  Social History   Socioeconomic History   Marital status: Single    Spouse name: Not on file   Number of children: 0   Years of education: Not on file   Highest education level: Not on file  Occupational History   Occupation: disabled  Tobacco Use   Smoking status: Former    Packs/day: 0.25    Years: 28.00    Pack years: 7.00    Types: Cigarettes   Smokeless tobacco: Never  Vaping Use   Vaping Use: Never used  Substance and Sexual Activity   Alcohol use: No    Alcohol/week: 0.0 standard drinks    Comment: rare   Drug use: Yes    Types: Marijuana, "Crack" cocaine     Comment: marijuana last use days ago; last cocaine several months ago   Sexual activity: Not Currently    Birth control/protection: None  Other Topics Concern   Not on file  Social History Narrative   Was a  Quarry manager at [employer]. Lives with godmother.   03/03/21 residing at [facility] since 01/03/21     Social Determinants of Health   Financial Resource Strain: Not on file  Food Insecurity: Not on file  Transportation Needs: Not on file  Physical Activity: Not on file  Stress: Not on file  Social Connections: Not on file  Intimate Partner Violence: Not on file    Family History:  Family History  Problem Relation Age of Onset   Stroke Brother 73   Stroke Brother 66   Heart attack Mother 59   Stroke Father 37    Medications:   Current Outpatient Medications on File Prior to Visit  Medication Sig Dispense Refill   ACCU-CHEK GUIDE test strip 2 (two) times daily. as directed     Accu-Chek Softclix Lancets lancets 2 (two) times daily.     albuterol (VENTOLIN HFA) 108 (90 Base) MCG/ACT inhaler Inhale into the lungs.     amLODipine (NORVASC) 10 MG tablet Take 1 tablet (10 mg total) by mouth daily. 30 tablet 0   ARIPiprazole (ABILIFY) 5 MG tablet Take 5 mg by mouth daily.     aspirin EC 81 MG EC tablet Take 1 tablet (81 mg total) by mouth daily.     atorvastatin (LIPITOR) 40 MG tablet Take 2 tablets (80 mg total) by mouth daily at 6 PM.     Blood Glucose Monitoring Suppl (ACCU-CHEK GUIDE) w/Device KIT USE AS DIRECTED TO CHECK BLOOD SUGAR     carboxymethylcellulose (REFRESH PLUS) 0.5 % SOLN 1 drop at bedtime.     cloNIDine (CATAPRES) 0.2 MG tablet Take 0.2 mg by mouth 2 (two) times daily. For 7 days     furosemide (LASIX) 20 MG tablet Take 20 mg by mouth daily.     hydrALAZINE (APRESOLINE) 100 MG tablet Take 1 tablet (100 mg total) by mouth 3 (three) times daily. 90 tablet 0   insulin aspart (NOVOLOG) 100 UNIT/ML injection Inject 0-9 Units into the skin 3 (three) times  daily with meals. Correction coverage: Sensitive (thin, NPO, renal) CBG < 70: Implement Hypoglycemia protocol. CBG 70 - 120: 0 units CBG 121 - 150: 1 unit CBG 151 - 200: 2 units CBG 201 - 250: 3 units CBG 251 - 300: 5 units CBG 301 - 350: 7 units CBG 351 - 400: 9 units CBG > 400: call MD.     insulin glargine (LANTUS) 100 UNIT/ML injection Inject 0.1 mLs (10 Units total) into the skin daily.  10 mL 0   lisinopril (ZESTRIL) 40 MG tablet Take 1 tablet (40 mg total) by mouth daily. 30 tablet 0   magnesium hydroxide (MILK OF MAGNESIA) 400 MG/5ML suspension Take by mouth daily as needed for mild constipation.     metoprolol succinate (TOPROL-XL) 50 MG 24 hr tablet Take 50 mg by mouth every morning.     mirtazapine (REMERON) 15 MG tablet Take 1 tablet (15 mg total) by mouth at bedtime. For Depression (Patient taking differently: Take 15 mg by mouth at bedtime. For Depression , appetite) 30 tablet 0   pantoprazole (PROTONIX) 40 MG tablet Take 1 tablet (40 mg total) by mouth daily.     PEG 3350-KCl-NaBcb-NaCl-NaSulf (PEG-3350/ELECTROLYTES) 236 g SOLR SMARTSIG:Milliliter(s) By Mouth     polyethylene glycol (MIRALAX / GLYCOLAX) 17 g packet Take 17 g by mouth daily.     traMADol (ULTRAM) 50 MG tablet Take by mouth every 6 (six) hours as needed.     No current facility-administered medications on file prior to visit.    Allergies:   Allergies  Allergen Reactions   Morphine And Related Hives   Metformin Diarrhea and Other (See Comments)    "Allergic," per MAR      OBJECTIVE:  Physical Exam  Vitals:   06/27/21 0935 06/27/21 1034  BP: (!) 170/100 (!) 142/94  Pulse: 68     There is no height or weight on file to calculate BMI. No results found.   General: well developed, well nourished, middle-age African-American female, seated, in no evident distress Head: head normocephalic and atraumatic.   Neck: supple with no carotid or supraclavicular bruits Cardiovascular: regular rate and  rhythm, no murmurs Musculoskeletal: no deformity Skin:  no rash/petichiae Vascular:  Normal pulses all extremities   Neurologic Exam Mental Status: Awake and fully alert.  Mild dysarthria and hypophonia.  No evidence of aphasia.  Limited conversation.  Oriented to place and time. Recent and remote memory diminished. Attention span, concentration and fund of knowledge impaired. Mood and affect appropriate.  Cranial Nerves: Pupils equal, briskly reactive to light. Extraocular movements full without nystagmus. Visual fields full to confrontation. Hearing intact. Facial sensation intact.  Left lower facial weakness.  Tongue, and palate moves normally and symmetrically.  Motor: Normal strength, bulk and tone on right side.  Mild chronic left hemiparesis with spasticity Sensory.:  intact to light touch, vibratory and pin prick sensation  Coordination: Rapid alternating movements normal in all extremities on right side. Finger-to-nose and heel-to-shin performed accurately on right side. Gait and Station: Deferred Reflexes: 2+ LUE and LLE; 1+ RUE and RLE. Toes downgoing.         ASSESSMENT: Diamond Rice is a 50 y.o. year old female with recent left CR infarct likely secondary to small vessel disease with uncontrolled risk factors of HTN and HLD on 12/27/2020.  Vascular risk factors include history of prior strokes including both ischemic and ICH with residual left hemiparesis and dysarthria, HTN, HLD, DM, PAOD s/p LLE reconstruction to heal with toe medication noncompliance, history of PE, migraines and substance abuse.      PLAN:  L CR stroke : Hx of prior ischemic and hemorrhagic strokes Residual deficit: Mild left hemiparesis with moderate spasticity, mild dysarthria and left facial weakness.  Encouraged continued participation with therapies at SNF   Continue aspirin 81 mg daily  and atorvastatin 80 mg daily for secondary stroke prevention.   Discussed secondary stroke prevention  measures and importance of close PCP follow up for  aggressive stroke risk factor management  HTN: BP goal <130/90.  Elevated today - reports she did not take AM BP meds yet - not routinely monitored by facility per patient. Requested routine monitoring and further management HLD: LDL goal <70.  Continue atorvastatin 80 mg daily currently managed by SNF as well as routine monitoring of lipid panel - unable to personally view results DMII: A1c goal<7.0.  Routinely monitored by SNF.  PAOD: Routinely followed by vascular surgery Dr. Donzetta Matters    Follow up in 6 months or call earlier if needed    I spent 32 minutes of face-to-face and non-face-to-face time with patient.  This included previsit chart review, lab review, study review, electronic health record documentation, and patient discussion and education regarding prior stroke including etiology, secondary stroke prevention measures and aggressive stroke risk factor management, residual deficits and answered all questions to patient satisfaction  Frann Rider, AGNP-BC  [facility] [address] [place], [place] [address]  Phone [phone] Fax [phone] Note: This document was prepared with digital dictation and possible smart phrase technology. Any transcriptional errors that result from this process are unintentional.

## 2021-06-30 ENCOUNTER — Other Ambulatory Visit: Payer: Self-pay | Admitting: Internal Medicine

## 2021-07-05 ENCOUNTER — Other Ambulatory Visit: Payer: Self-pay

## 2021-07-05 DIAGNOSIS — J449 Chronic obstructive pulmonary disease, unspecified: Secondary | ICD-10-CM | POA: Insufficient documentation

## 2021-07-05 DIAGNOSIS — E1122 Type 2 diabetes mellitus with diabetic chronic kidney disease: Secondary | ICD-10-CM | POA: Diagnosis not present

## 2021-07-05 DIAGNOSIS — E1140 Type 2 diabetes mellitus with diabetic neuropathy, unspecified: Secondary | ICD-10-CM | POA: Diagnosis not present

## 2021-07-05 DIAGNOSIS — N3000 Acute cystitis without hematuria: Secondary | ICD-10-CM | POA: Insufficient documentation

## 2021-07-05 DIAGNOSIS — Z87891 Personal history of nicotine dependence: Secondary | ICD-10-CM | POA: Diagnosis not present

## 2021-07-05 DIAGNOSIS — I129 Hypertensive chronic kidney disease with stage 1 through stage 4 chronic kidney disease, or unspecified chronic kidney disease: Secondary | ICD-10-CM | POA: Diagnosis not present

## 2021-07-05 DIAGNOSIS — Z7982 Long term (current) use of aspirin: Secondary | ICD-10-CM | POA: Insufficient documentation

## 2021-07-05 DIAGNOSIS — Z79899 Other long term (current) drug therapy: Secondary | ICD-10-CM | POA: Insufficient documentation

## 2021-07-05 DIAGNOSIS — Z794 Long term (current) use of insulin: Secondary | ICD-10-CM | POA: Diagnosis not present

## 2021-07-05 DIAGNOSIS — N183 Chronic kidney disease, stage 3 unspecified: Secondary | ICD-10-CM | POA: Insufficient documentation

## 2021-07-05 DIAGNOSIS — R109 Unspecified abdominal pain: Secondary | ICD-10-CM | POA: Diagnosis present

## 2021-07-05 LAB — COMPREHENSIVE METABOLIC PANEL
ALT: 21 U/L (ref 0–44)
AST: 17 U/L (ref 15–41)
Albumin: 3.3 g/dL — ABNORMAL LOW (ref 3.5–5.0)
Alkaline Phosphatase: 82 U/L (ref 38–126)
Anion gap: 8 (ref 5–15)
BUN: 19 mg/dL (ref 6–20)
CO2: 29 mmol/L (ref 22–32)
Calcium: 9.2 mg/dL (ref 8.9–10.3)
Chloride: 102 mmol/L (ref 98–111)
Creatinine, Ser: 1.53 mg/dL — ABNORMAL HIGH (ref 0.44–1.00)
GFR, Estimated: 41 mL/min — ABNORMAL LOW (ref >60)
Glucose, Bld: 251 mg/dL — ABNORMAL HIGH (ref 70–99)
Potassium: 3.5 mmol/L (ref 3.5–5.1)
Sodium: 139 mmol/L (ref 135–145)
Total Bilirubin: 0.5 mg/dL (ref 0.3–1.2)
Total Protein: 7.1 g/dL (ref 6.5–8.1)

## 2021-07-05 LAB — CBC
HCT: 33.4 % — ABNORMAL LOW (ref 36.0–46.0)
Hemoglobin: 10.2 g/dL — ABNORMAL LOW (ref 12.0–15.0)
MCH: 22 pg — ABNORMAL LOW (ref 26.0–34.0)
MCHC: 30.5 g/dL (ref 30.0–36.0)
MCV: 72 fL — ABNORMAL LOW (ref 80.0–100.0)
Platelets: 286 10*3/uL (ref 150–400)
RBC: 4.64 MIL/uL (ref 3.87–5.11)
RDW: 17.2 % — ABNORMAL HIGH (ref 11.5–15.5)
WBC: 8.9 10*3/uL (ref 4.0–10.5)
nRBC: 0 % (ref 0.0–0.2)

## 2021-07-05 LAB — LIPASE, BLOOD: Lipase: 39 U/L (ref 11–51)

## 2021-07-05 NOTE — ED Triage Notes (Signed)
Pt presents to ER via ems from [facility].  Per ems, pt is coming in with only c/o hypertension.  When asked what pt is here for, pt points at RUQ.  Pt states she has had this pain all day.  Pt denies n/v.  Pt A&O x4 at this time.

## 2021-07-06 ENCOUNTER — Emergency Department: Payer: Medicare Other

## 2021-07-06 ENCOUNTER — Emergency Department
Admission: EM | Admit: 2021-07-06 | Discharge: 2021-07-06 | Disposition: A | Discharge status: Home / Self Care | Payer: Medicare Other | Attending: Emergency Medicine | Admitting: Emergency Medicine

## 2021-07-06 DIAGNOSIS — N3000 Acute cystitis without hematuria: Secondary | ICD-10-CM

## 2021-07-06 DIAGNOSIS — R1030 Lower abdominal pain, unspecified: Secondary | ICD-10-CM

## 2021-07-06 LAB — URINALYSIS, ROUTINE W REFLEX MICROSCOPIC
Bilirubin Urine: NEGATIVE
Glucose, UA: 50 mg/dL — AB
Hgb urine dipstick: NEGATIVE
Ketones, ur: NEGATIVE mg/dL
Nitrite: POSITIVE — AB
Protein, ur: 30 mg/dL — AB
Specific Gravity, Urine: 1.018 (ref 1.005–1.030)
WBC, UA: >50 WBC/hpf — ABNORMAL HIGH (ref 0–5)
pH: 6 (ref 5.0–8.0)

## 2021-07-06 LAB — TROPONIN I (HIGH SENSITIVITY)
Troponin I (High Sensitivity): 3 ng/L (ref <18)
Troponin I (High Sensitivity): 4 ng/L (ref <18)

## 2021-07-06 MED ORDER — ACETAMINOPHEN 500 MG PO TABS
1000.0000 mg | ORAL_TABLET | Freq: Once | ORAL | Status: AC
  Administered 2021-07-06: 1000 mg via ORAL
  Filled 2021-07-06: qty 2

## 2021-07-06 MED ORDER — CEPHALEXIN 500 MG PO CAPS: 500.0000 mg | ORAL_CAPSULE | Freq: Two times a day (BID) | ORAL | 0 refills | Status: AC

## 2021-07-06 MED ORDER — OXYCODONE HCL 5 MG PO TABS
5.0000 mg | ORAL_TABLET | Freq: Once | ORAL | Status: AC
  Administered 2021-07-06: 5 mg via ORAL
  Filled 2021-07-06: qty 1

## 2021-07-06 MED ORDER — CEPHALEXIN 500 MG PO CAPS
500.0000 mg | ORAL_CAPSULE | Freq: Once | ORAL | Status: AC
  Administered 2021-07-06: 500 mg via ORAL
  Filled 2021-07-06: qty 1

## 2021-07-06 MED ORDER — CEPHALEXIN 500 MG PO CAPS: 500.0000 mg | ORAL_CAPSULE | Freq: Two times a day (BID) | ORAL | 0 refills | Status: DC

## 2021-07-06 NOTE — ED Notes (Signed)
Attempt to call report x1 to [place] [facility].

## 2021-07-06 NOTE — Discharge Instructions (Signed)
Diamond Rice has a UTI.  Please take Keflex antibiotic twice daily for the next 5 days to treat this.  Return to the ED with any worsening symptoms despite this.

## 2021-07-06 NOTE — ED Notes (Signed)
Attempt to call report again to  health care - No answer

## 2021-07-06 NOTE — ED Notes (Signed)
Cath performed to obtain urine specimen with Katie RN - Pt changed r/t incontinence. Appropriate pericare provided. Call light in reach Denies further needs at this time.   Diamond Rice ERT to draw troponin.

## 2021-07-06 NOTE — ED Provider Notes (Signed)
[facility] Emergency Department Provider Note ____________________________________________   Event Date/Time   First MD Initiated Contact with Patient 07/06/21 0008     (approximate)  I have reviewed the triage vital signs and the nursing notes.  HISTORY  Chief Complaint Abdominal Pain   HPI Diamond Rice is a 50 y.o. femalewho presents to the ED for evaluation of abd pain.   Chart review indicates history of stroke with residual left-sided weakness and spasticity.  Resides at a local SNF.  PAD and PE previously on [facility] but stopped due to [place], HTN, HLD, morbid obesity, DM, CKD and COPD.  Patient presents to the ED for evaluation of right-sided abdominal pain and urinary frequency over the past 1 day.  She reports intermittent pain that is up to moderate intensity, lasting minutes before improving.  Reports urinary frequency without dysuria or foul smell as well.  Reports the pain is "not as bad" but is requesting analgesia.  She denies fever, chest pain, shortness of breath  Past Medical History:  Diagnosis Date   Acute ischemic stroke ([place]) 12/27/2020   Acute renal failure (ARF) ([place]) 04/02/2018   Anxiety    Chronic lower back pain 04/17/2012   "just got over some; catched when I walked"   COPD (chronic obstructive pulmonary disease) ([place])    Critical lower limb ischemia ([place]) 05/27/2018   Depression    Headache(784.0) 04/17/2012   "~ qod; lately waking up in am w/one"   High cholesterol    Hypertension    Migraines 04/17/2012   Obesity    Sleep apnea    CPAP   Stroke ([place])    Type II diabetes mellitus ([place]) 08/28/2002    Patient Active Problem List   Diagnosis Date Noted   Acute CVA (cerebrovascular accident) ([place]) 01/18/2021   Leukocytosis 01/17/2021   Dysarthria    Acute lower UTI    Abnormal finding on lung imaging 12/27/2020   Severe episode of recurrent major depressive disorder, with psychotic features ([place])    Cerebrovascular accident  (CVA) ([place]) 12/06/2019   AKI (acute kidney injury) ([place]) 12/06/2019   LVH (left ventricular hypertrophy) 08/10/2019   Edema of lower extremity 11/26/2018   Loss of appetite 11/26/2018   Urinary incontinence 11/26/2018   Allergic rhinitis 11/14/2018   Deep venous thrombosis ([place]) 11/14/2018   Nicotine dependence 11/14/2018   Hemorrhagic stroke (HCC)    Diastolic dysfunction    Type 2 diabetes mellitus (HCC)    Anemia of chronic disease    Chronic obstructive pulmonary disease (HCC)    ICH (intracerebral hemorrhage) ([place]) 08/30/2018   Hypertensive heart disease without heart failure 07/04/2018   Nonrheumatic aortic valve insufficiency 07/04/2018   Esophageal abnormality    Class 2 severe obesity due to excess calories with serious comorbidity and body mass index (BMI) of 35.0 to 35.9 in adult [facility])    Peripheral vascular disease ([place])    History of osteomyelitis    History of amputation of lesser toe ([place])    Pulmonary embolism ([place]) 06/22/2018   Microcytic anemia 06/17/2018   At risk for adverse drug reaction 06/11/2018   Critical lower limb ischemia ([place]) 05/27/2018   GI bleeding 04/02/2018   Hypokalemia 04/02/2018   Polysubstance abuse ([place]) 04/02/2018   Diabetic foot ulcer ([place]) 04/02/2018   Tinea pedis 03/07/2018   Diabetic peripheral neuropathy ([place]) 01/24/2018   Cannabis use disorder, moderate, dependence ([place]) 05/19/2017   Cocaine use disorder, moderate, dependence ([place]) 05/19/2017   CKD (chronic  kidney disease) stage 3, GFR 30-59 ml/min (HCC) 02/17/2017   Noncompliance with medications 02/17/2017   Suicide attempt ([place]) 11/06/2016   Mild cognitive impairment 03/21/2016   Moderate episode of recurrent major depressive disorder ([place]) 03/21/2016   B12 deficiency 03/09/2016   Iron deficiency anemia 03/07/2016   History of CVA with residual deficit 12/30/2015   Schizoaffective disorder, bipolar type ([place]) 10/07/2015   OSA (obstructive sleep apnea) 06/09/2015   Vitamin D  deficiency 01/06/2015   Bilateral knee pain 01/05/2015   Numbness and tingling in right hand 01/05/2015   Insomnia 12/07/2014   Right-sided lacunar infarction ([place]) 09/15/2014   Left hemiparesis ([place]) 09/15/2014   Dyspnea    Back pain 09/11/2013   Psychoactive substance-induced organic mood disorder ([place]) 05/28/2013   Cocaine abuse with cocaine-induced mood disorder ([place]) 05/28/2013   Cannabis dependence with cannabis-induced anxiety disorder ([place]) 05/28/2013   Morbid obesity with BMI of 45.0-49.9, adult ([place]) 04/25/2012   Chest pain 04/17/2012   Hypertension 04/17/2012   DM (diabetes mellitus) with peripheral vascular complication ([place]) 17/91/5056   Hyperlipidemia 04/17/2012   Tobacco use 04/17/2012    Past Surgical History:  Procedure Laterality Date   ABDOMINAL AORTOGRAM W/LOWER EXTREMITY Left 05/27/2018   Procedure: ABDOMINAL AORTOGRAM W/LOWER EXTREMITY Runoff and Possible Intervention;  Surgeon: Waynetta Sandy, MD;  Location: [place] [facility];  Service: Cardiovascular;  Laterality: Left;   APPLICATION OF WOUND VAC Left 06/06/2018   Procedure: APPLICATION OF WOUND VAC;  Surgeon: Waynetta Sandy, MD;  Location: [place];  Service: Vascular;  Laterality: Left;   BALLOON DILATION N/A 12/31/2020   Procedure: [place];  Surgeon: Ronnette Juniper, MD;  Location: [place];  Service: Gastroenterology;  Laterality: N/A;   BIOPSY  04/04/2018   Procedure: BIOPSY;  Surgeon: [name], MD;  Location: [facility] ENDOSCOPY;  Service: Endoscopy;;   BIOPSY  10/27/2020   Procedure: BIOPSY;  Surgeon: Arta Silence, MD;  Location: [facility] ENDOSCOPY;  Service: Endoscopy;;   BOTOX INJECTION N/A 12/31/2020   Procedure: BOTOX INJECTION;  Surgeon: Ronnette Juniper, MD;  Location: [place];  Service: Gastroenterology;  Laterality: N/A;   CARDIAC CATHETERIZATION  ~ 2011   COLONOSCOPY N/A 04/04/2018   Procedure: COLONOSCOPY;  Surgeon: [name], MD;  Location: [facility] ENDOSCOPY;  Service:  Endoscopy;  Laterality: N/A;   COLONOSCOPY WITH PROPOFOL N/A 10/27/2020   Procedure: COLONOSCOPY WITH PROPOFOL;  Surgeon: Arta Silence, MD;  Location: [facility] ENDOSCOPY;  Service: Endoscopy;  Laterality: N/A;   ESOPHAGOGASTRODUODENOSCOPY N/A 12/31/2020   Procedure: ESOPHAGOGASTRODUODENOSCOPY (EGD);  Surgeon: Ronnette Juniper, MD;  Location: [place];  Service: Gastroenterology;  Laterality: N/A;   ESOPHAGOGASTRODUODENOSCOPY (EGD) WITH PROPOFOL N/A 10/27/2020   Procedure: ESOPHAGOGASTRODUODENOSCOPY (EGD) WITH PROPOFOL  WITH POSSIBLE DIL;  Surgeon: Arta Silence, MD;  Location: [facility] ENDOSCOPY;  Service: Endoscopy;  Laterality: N/A;   FOREIGN BODY REMOVAL  12/31/2020   Procedure: FOREIGN BODY REMOVAL;  Surgeon: Ronnette Juniper, MD;  Location[facility] ENDOSCOPY;  Service: Gastroenterology;;   LOWER EXTREMITY ANGIOGRAM Left 05/27/2018   PERIPHERAL VASCULAR INTERVENTION Left 05/27/2018   Procedure: PERIPHERAL VASCULAR INTERVENTION;  Surgeon: Waynetta Sandy, MD;  Location: [place] [facility];  Service: Cardiovascular;  Laterality: Left;   POLYPECTOMY  04/04/2018   Procedure: POLYPECTOMY;  Surgeon: [name], MD;  Location: [facility] ENDOSCOPY;  Service: Endoscopy;;   POLYPECTOMY  10/27/2020   Procedure: POLYPECTOMY;  Surgeon: Arta Silence, MD;  Location: [facility] ENDOSCOPY;  Service: Endoscopy;;   TRANSMETATARSAL AMPUTATION Left 05/28/2018   Procedure: AMPUTATION TOES THREE, FOUR AND  FIVE on left foot;  Surgeon: Waynetta Sandy, MD;  Location: [place];  Service: Vascular;  Laterality: Left;   WOUND DEBRIDEMENT Left 06/06/2018   Procedure: DEBRIDEMENT WOUND LEFT FOOT;  Surgeon: Waynetta Sandy, MD;  Location: [place];  Service: Vascular;  Laterality: Left;    Prior to Admission medications   Medication Sig Start Date End Date Taking? Authorizing Provider  cephALEXin (KEFLEX) 500 MG capsule Take 1 capsule (500 mg total) by mouth 2 (two) times daily for 5 days. 07/06/21 07/11/21 Yes Vladimir Crofts, MD   ACCU-CHEK GUIDE test strip 2 (two) times daily. as directed 02/06/20   [provider]  Accu-Chek Softclix Lancets lancets 2 (two) times daily. 02/06/20   [provider]  albuterol (VENTOLIN HFA) 108 (90 Base) MCG/ACT inhaler Inhale into the lungs. 06/22/21   [provider]  amLODipine (NORVASC) 10 MG tablet Take 1 tablet (10 mg total) by mouth daily. 01/22/20   Royal Hawthorn, NP  ARIPiprazole (ABILIFY) 5 MG tablet Take 5 mg by mouth daily. 08/28/20   [provider]  aspirin EC 81 MG EC tablet Take 1 tablet (81 mg total) by mouth daily. 09/21/18   Donzetta Starch, NP  atorvastatin (LIPITOR) 40 MG tablet Take 2 tablets (80 mg total) by mouth daily at 6 PM. 01/03/21   Hongalgi, Lenis Dickinson, MD  Blood Glucose Monitoring Suppl (ACCU-CHEK GUIDE) w/Device KIT USE AS DIRECTED TO CHECK BLOOD SUGAR 02/06/20   [provider]  carboxymethylcellulose (REFRESH PLUS) 0.5 % SOLN 1 drop at bedtime.    [provider]  cloNIDine (CATAPRES) 0.2 MG tablet Take 0.2 mg by mouth 2 (two) times daily. For 7 days 01/11/21 06/27/21  [provider]  furosemide (LASIX) 20 MG tablet Take 20 mg by mouth daily. 06/10/21   [provider]  hydrALAZINE (APRESOLINE) 100 MG tablet Take 1 tablet (100 mg total) by mouth 3 (three) times daily. 01/22/20   Royal Hawthorn, NP  insulin aspart (NOVOLOG) 100 UNIT/ML injection Inject 0-9 Units into the skin 3 (three) times daily with meals. Correction coverage: Sensitive (thin, NPO, renal) CBG < 70: Implement Hypoglycemia protocol. CBG 70 - 120: 0 units CBG 121 - 150: 1 unit CBG 151 - 200: 2 units CBG 201 - 250: 3 units CBG 251 - 300: 5 units CBG 301 - 350: 7 units CBG 351 - 400: 9 units CBG > 400: call MD. 01/03/21   Modena Jansky, MD  insulin glargine (LANTUS) 100 UNIT/ML injection Inject 0.1 mLs (10 Units total) into the skin daily. 01/21/21   Nolberto Hanlon, MD  lisinopril (ZESTRIL) 40 MG tablet Take 1 tablet (40 mg  total) by mouth daily. 01/22/20   Royal Hawthorn, NP  magnesium hydroxide (MILK OF MAGNESIA) 400 MG/5ML suspension Take by mouth daily as needed for mild constipation.    [provider]  metoprolol succinate (TOPROL-XL) 50 MG 24 hr tablet Take 50 mg by mouth every morning. 08/02/20   [provider]  mirtazapine (REMERON) 15 MG tablet Take 1 tablet (15 mg total) by mouth at bedtime. For Depression Patient taking differently: Take 15 mg by mouth at bedtime. For Depression , appetite 01/22/20   Royal Hawthorn, NP  pantoprazole (PROTONIX) 40 MG tablet Take 1 tablet (40 mg total) by mouth daily. 01/03/21   Hongalgi, Lenis Dickinson, MD  PEG 3350-KCl-NaBcb-NaCl-NaSulf (PEG-3350/ELECTROLYTES) 236 g SOLR SMARTSIG:Milliliter(s) By Mouth 01/13/21   [provider]  polyethylene glycol (MIRALAX / GLYCOLAX) 17 g packet Take  17 g by mouth daily.    [provider]  traMADol (ULTRAM) 50 MG tablet Take by mouth every 6 (six) hours as needed.    [provider]    Allergies Morphine and related and Metformin  Family History  Problem Relation Age of Onset   Stroke Brother 20   Stroke Brother 59   Heart attack Mother 31   Stroke Father 16    Social History Social History   Tobacco Use   Smoking status: Former    Packs/day: 0.25    Years: 28.00    Pack years: 7.00    Types: Cigarettes   Smokeless tobacco: Never  Vaping Use   Vaping Use: Never used  Substance Use Topics   Alcohol use: No    Alcohol/week: 0.0 standard drinks    Comment: rare   Drug use: Yes    Types: Marijuana, "Crack" cocaine    Comment: marijuana last use days ago; last cocaine several months ago    Review of Systems  Constitutional: No fever/chills Eyes: No visual changes. ENT: No sore throat. Cardiovascular: Denies chest pain. Respiratory: Denies shortness of breath. Gastrointestinal: no vomiting.  No diarrhea.  No constipation. Positive for abdominal pain. Genitourinary: Negative  for dysuria.  Positive for urinary frequency. Musculoskeletal: Negative for back pain. Skin: Negative for rash. Neurological: Negative for headaches, focal weakness or numbness.  ____________________________________________   PHYSICAL EXAM:  VITAL SIGNS: Vitals:   07/06/21 0330 07/06/21 0400  BP: 106/64 108/68  Pulse: 70 71  Resp: 17 17  Temp:    SpO2: 96% 97%     Constitutional: Alert and oriented. Well appearing and in no acute distress. Eyes: Conjunctivae are normal. PERRL. EOMI. Head: Atraumatic. Nose: No congestion/rhinnorhea. Mouth/Throat: Mucous membranes are moist.  Oropharynx non-erythematous. Neck: No stridor. No cervical spine tenderness to palpation. Cardiovascular: Normal rate, regular rhythm. Grossly normal heart sounds.  Good peripheral circulation. Respiratory: Normal respiratory effort.  No retractions. Lungs CTAB. Gastrointestinal: Soft , nondistended. No CVA tenderness. Mild suprapubic tenderness without peritoneal features.  Abdomen is otherwise benign. Musculoskeletal: No lower extremity tenderness nor edema.  No joint effusions. No signs of acute trauma. Neurologic:  Normal speech and language. No acute neurologic deficits are appreciated.  Skin:  Skin is warm, dry and intact. No rash noted. Psychiatric: Mood and affect are normal. Speech and behavior are normal.  ____________________________________________   LABS (all labs ordered are listed, but only abnormal results are displayed)  Labs Reviewed  COMPREHENSIVE METABOLIC PANEL - Abnormal; Notable for the following components:      Result Value   Glucose, Bld 251 (*)    Creatinine, Ser 1.53 (*)    Albumin 3.3 (*)    GFR, Estimated 41 (*)    All other components within normal limits  CBC - Abnormal; Notable for the following components:   Hemoglobin 10.2 (*)    HCT 33.4 (*)    MCV 72.0 (*)    MCH 22.0 (*)    RDW 17.2 (*)    All other components within normal limits  URINALYSIS, ROUTINE W  REFLEX MICROSCOPIC - Abnormal; Notable for the following components:   Color, Urine YELLOW (*)    APPearance HAZY (*)    Glucose, UA 50 (*)    Protein, ur 30 (*)    Nitrite POSITIVE (*)    Leukocytes,Ua LARGE (*)    WBC, UA >50 (*)    Bacteria, UA MANY (*)    All other components within normal limits  LIPASE, BLOOD  TROPONIN I (HIGH SENSITIVITY)  TROPONIN I (HIGH SENSITIVITY)   ____________________________________________  12 Lead EKG  Sinus rhythm, rate of 75 bpm.  Normal axis.  First-degree AV block right at 210 ms and otherwise normal intervals.  Nonspecific ST changes to septal leads without STEMI.  Similar to EKG from May. ____________________________________________  RADIOLOGY  ED MD interpretation: CT reviewed by me Official radiology report(s): CT ABDOMEN PELVIS WO CONTRAST  Result Date: 07/06/2021 CLINICAL DATA:  Urinary frequency. EXAM: CT ABDOMEN AND PELVIS WITHOUT CONTRAST TECHNIQUE: Multidetector CT imaging of the abdomen and pelvis was performed following the standard protocol without IV contrast. COMPARISON:  CT abdomen pelvis dated 05/22/2021. FINDINGS: Evaluation of this exam is limited in the absence of intravenous contrast. Lower chest: The visualized lung bases are clear. No intra-abdominal free air or free fluid. Hepatobiliary: The liver is unremarkable. No intrahepatic biliary dilatation. The gallbladder is contracted. No calcified gallstone. Pancreas: Unremarkable. No pancreatic ductal dilatation or surrounding inflammatory changes. Spleen: Normal in size without focal abnormality. Adrenals/Urinary Tract: The adrenal glands unremarkable. The kidneys, visualized ureters, and urinary bladder appear unremarkable. Stomach/Bowel: There is moderate stool throughout the colon. There is no bowel obstruction or active inflammation. The appendix is normal. Vascular/Lymphatic: Mild aortoiliac atherosclerotic disease. The IVC is unremarkable. No portal gas. There is no  adenopathy. Reproductive: The uterus is anteverted. Ill-defined uterine fibroid. No adnexal masses. Other: None Musculoskeletal: Degenerative changes of the spine. No acute osseous pathology. IMPRESSION: 1. No acute intra-abdominal or pelvic pathology. No hydronephrosis or nephrolithiasis. 2. Moderate colonic stool burden. No bowel obstruction. Normal appendix. 3. Aortic Atherosclerosis (ICD10-I70.0). Electronically Signed   By: Anner Crete M.D.   On: 07/06/2021 01:09    ____________________________________________   PROCEDURES and INTERVENTIONS  Procedure(s) performed (including Critical Care):  .1-3 Lead EKG Interpretation Performed by: Vladimir Crofts, MD Authorized by: Vladimir Crofts, MD     Interpretation: normal     ECG rate:  70   ECG rate assessment: normal     Rhythm: sinus rhythm     Ectopy: none     Conduction: normal    Medications  oxyCODONE (Oxy IR/ROXICODONE) immediate release tablet 5 mg (5 mg Oral Given 07/06/21 0035)  acetaminophen (TYLENOL) tablet 1,000 mg (1,000 mg Oral Given 07/06/21 0035)  cephALEXin (KEFLEX) capsule 500 mg (500 mg Oral Given 07/06/21 0353)    ____________________________________________   MDM / ED COURSE   50 year old woman presents to the ED with abdominal pain, with evidence of acute cystitis causing her symptoms, and amenable to outpatient management.  No evidence of sepsis, pyelonephritis or urologic obstruction.  Blood work with hyperglycemia without acidosis or evidence of DKA.  No evidence of pancreatitis or cardiopulmonary pathology.  We will get her started on Keflex discharged with return precautions.     ____________________________________________   FINAL CLINICAL IMPRESSION(S) / ED DIAGNOSES  Final diagnoses:  Acute cystitis without hematuria  Lower abdominal pain     ED Discharge Orders          Ordered    cephALEXin (KEFLEX) 500 MG capsule  2 times daily        07/06/21 0300             Severa Jeremiah   Note:  This document was prepared using Systems analyst and may include unintentional dictation errors.    Vladimir Crofts, MD 07/06/21 [phone]

## 2021-07-08 ENCOUNTER — Ambulatory Visit (INDEPENDENT_AMBULATORY_CARE_PROVIDER_SITE_OTHER): Payer: Medicare Other | Admitting: Medical

## 2021-07-08 ENCOUNTER — Encounter: Payer: Self-pay | Admitting: Medical

## 2021-07-08 ENCOUNTER — Other Ambulatory Visit: Payer: Self-pay

## 2021-07-08 VITALS — BP 147/93 | HR 62 | Ht 60.0 in | Wt 248.0 lb

## 2021-07-08 DIAGNOSIS — I779 Disorder of arteries and arterioles, unspecified: Secondary | ICD-10-CM

## 2021-07-08 DIAGNOSIS — E785 Hyperlipidemia, unspecified: Secondary | ICD-10-CM | POA: Diagnosis not present

## 2021-07-08 DIAGNOSIS — E1151 Type 2 diabetes mellitus with diabetic peripheral angiopathy without gangrene: Secondary | ICD-10-CM

## 2021-07-08 DIAGNOSIS — I10 Essential (primary) hypertension: Secondary | ICD-10-CM

## 2021-07-08 DIAGNOSIS — I639 Cerebral infarction, unspecified: Secondary | ICD-10-CM

## 2021-07-08 DIAGNOSIS — I351 Nonrheumatic aortic (valve) insufficiency: Secondary | ICD-10-CM

## 2021-07-08 MED ORDER — CARVEDILOL 12.5 MG PO TABS: 12.5000 mg | ORAL_TABLET | Freq: Two times a day (BID) | ORAL | 3 refills | Status: DC

## 2021-07-08 NOTE — Addendum Note (Signed)
Addended by: Jacqulynn Cadet on: 07/08/2021 03:04 PM   Modules accepted: Orders

## 2021-07-08 NOTE — Patient Instructions (Addendum)
Medication Instructions:  STOP Metoprolol Succinate  START Carvedilol (Coreg) 12.5 mg 2 times a day  *If you need a refill on your cardiac medications before your next appointment, please call your pharmacy*  Lab Work: NONE ordered at this time of appointment   If you have labs (blood work) drawn today and your tests are completely normal, you will receive your results only by: [place] (if you have MyChart) OR A paper copy in the mail If you have any lab test that is abnormal or we need to change your treatment, we will call you to review the results.  Testing/Procedures: NONE ordered at this time of appointment   Follow-Up: At [facility], you and your health needs are our priority.  As part of our continuing mission to provide you with exceptional heart care, we have created designated Provider Care Teams.  These Care Teams include your primary Cardiologist (physician) and Advanced Practice Providers (APPs -  Physician Assistants and Nurse Practitioners) who all work together to provide you with the care you need, when you need it.  Your next appointment:   2 week(s) 4 month(s)  The format for your next appointment:   In Person In Person  Provider:   Pharm D Minus Breeding, MD  Other Instructions Monitor blood pressure 2 times a day and bring with to 2 week Pharm D appointment

## 2021-07-08 NOTE — Progress Notes (Signed)
Cardiology Office Note   Date:  07/08/2021   ID:  Diamond Rice, DOB 11/08/1970, MRN [id number]  PCP:  System, Provider Not In  Cardiologist:  Rollene Rotunda, MD EP: None  Chief Complaint  Patient presents with   Follow-up    HTN       History of Present Illness: Diamond Rice is a 50 y.o. female HTN, HLD, DM type II, PAD s/p toe amputation, OSA on CPAP, CVA, history of PE following toe amputation no longer on anticoagulation, CKD stage IIIb and polysubstance abuse (tobacco, cocaine, and marijuana) who presents for 65-month follow-up.  She was last evaluated by cardiology at an outpatient visit with Dr. Antoine Poche 02/2021 at which time she noted some leg pain with ambulation.  She was noted to have ongoing left hemiparesis following CVA 12/2020.  She was recommended to undergo LEA/ABIs to 19/2022 showing evaluate leg pain which occurred 7/mild atherosclerosis throughout extremity with decreased blood flow of the left ATA.  She was referred back to vascular surgery and seen by Dr. Randie Heinz 03/2021 who recommends avoidance of further intervention given extensive left lower extremity endovascular revascularization with ATA cannulation in the past, reserving repeat intervention for unbearable pain or wounds.  Her last ischemic evaluation was a nuclear stress test in 2013 which showed no evidence of ischemia.  Her last echocardiogram 12/2020 showed EF 60-65%, moderate LVH, G1 DD, no R WMA, mild LAE, and moderate to severe AI.  Since her last visit she has had 2 ED visits for cystitis for which she is on Keflex.  She presents today for follow-up of her hypertension.  She is continue to reside at an assisted living facility in [facility].  Overall she feels she has been stable from a cardiac standpoint.  She has chronic lower extremity edema for which she has been taking 40 mg of Lasix daily with stable kidney function on recent labs 07/05/2021.  She notes swelling usually improves with lower  extremity elevation.  She feels her breathing is at baseline.  Predominantly wheelchair-bound.  She notes ongoing lower extremity pain which sounds like could be a combination of sciatica and PAD.  She reports pain is relieved with tramadol and she has not had any wounds to her legs.  She has chronic orthopnea which is unchanged.  She has no complaints of palpitations, PND, dizziness, lightheadedness, syncope.    Past Medical History:  Diagnosis Date   Acute ischemic stroke (HCC) 12/27/2020   Acute renal failure (ARF) (HCC) 04/02/2018   Anxiety    Chronic lower back pain 04/17/2012   "just got over some; catched when I walked"   COPD (chronic obstructive pulmonary disease) (HCC)    Critical lower limb ischemia (HCC) 05/27/2018   Depression    Headache(784.0) 04/17/2012   "~ qod; lately waking up in am w/one"   High cholesterol    Hypertension    Migraines 04/17/2012   Obesity    Sleep apnea    CPAP   Stroke (HCC)    Type II diabetes mellitus (HCC) 08/28/2002    Past Surgical History:  Procedure Laterality Date   ABDOMINAL AORTOGRAM W/LOWER EXTREMITY Left 05/27/2018   Procedure: ABDOMINAL AORTOGRAM W/LOWER EXTREMITY Runoff and Possible Intervention;  Surgeon: Maeola Harman, MD;  Location: [facility] INVASIVE CV LAB;  Service: Cardiovascular;  Laterality: Left;   APPLICATION OF WOUND VAC Left 06/06/2018   Procedure: APPLICATION OF WOUND VAC;  Surgeon: Maeola Harman, MD;  Location: [facility] OR;  Service:  Vascular;  Laterality: Left;   BALLOON DILATION N/A 12/31/2020   Procedure: BALLOON DILATION;  Surgeon: Ronnette Juniper, MD;  Location: [place];  Service: Gastroenterology;  Laterality: N/A;   BIOPSY  04/04/2018   Procedure: BIOPSY;  Surgeon: [name], MD;  Location: [facility] ENDOSCOPY;  Service: Endoscopy;;   BIOPSY  10/27/2020   Procedure: BIOPSY;  Surgeon: Arta Silence, MD;  Location: [facility] ENDOSCOPY;  Service: Endoscopy;;   BOTOX INJECTION N/A 12/31/2020   Procedure: BOTOX INJECTION;   Surgeon: Ronnette Juniper, MD;  Location: [place];  Service: Gastroenterology;  Laterality: N/A;   CARDIAC CATHETERIZATION  ~ 2011   COLONOSCOPY N/A 04/04/2018   Procedure: COLONOSCOPY;  Surgeon: [name], MD;  Location: [facility] ENDOSCOPY;  Service: Endoscopy;  Laterality: N/A;   COLONOSCOPY WITH PROPOFOL N/A 10/27/2020   Procedure: COLONOSCOPY WITH PROPOFOL;  Surgeon: Arta Silence, MD;  Location: [facility] ENDOSCOPY;  Service: Endoscopy;  Laterality: N/A;   ESOPHAGOGASTRODUODENOSCOPY N/A 12/31/2020   Procedure: ESOPHAGOGASTRODUODENOSCOPY (EGD);  Surgeon: Ronnette Juniper, MD;  Location: [place];  Service: Gastroenterology;  Laterality: N/A;   ESOPHAGOGASTRODUODENOSCOPY (EGD) WITH PROPOFOL N/A 10/27/2020   Procedure: ESOPHAGOGASTRODUODENOSCOPY (EGD) WITH PROPOFOL  WITH POSSIBLE DIL;  Surgeon: Arta Silence, MD;  Location: [facility] ENDOSCOPY;  Service: Endoscopy;  Laterality: N/A;   FOREIGN BODY REMOVAL  12/31/2020   Procedure: FOREIGN BODY REMOVAL;  Surgeon: Ronnette Juniper, MD;  Location: [facility] ENDOSCOPY;  Service: Gastroenterology;;   LOWER EXTREMITY ANGIOGRAM Left 05/27/2018   PERIPHERAL VASCULAR INTERVENTION Left 05/27/2018   Procedure: PERIPHERAL VASCULAR INTERVENTION;  Surgeon: Waynetta Sandy, MD;  Location: [place] CV LAB;  Service: Cardiovascular;  Laterality: Left;   POLYPECTOMY  04/04/2018   Procedure: POLYPECTOMY;  Surgeon: [name], MD;  Location: [facility] ENDOSCOPY;  Service: Endoscopy;;   POLYPECTOMY  10/27/2020   Procedure: POLYPECTOMY;  Surgeon: Arta Silence, MD;  Location: [facility] ENDOSCOPY;  Service: Endoscopy;;   TRANSMETATARSAL AMPUTATION Left 05/28/2018   Procedure: AMPUTATION TOES THREE, FOUR AND FIVE on left foot;  Surgeon: Waynetta Sandy, MD;  Location: [place];  Service: Vascular;  Laterality: Left;   WOUND DEBRIDEMENT Left 06/06/2018   Procedure: DEBRIDEMENT WOUND LEFT FOOT;  Surgeon: Waynetta Sandy, MD;  Location: [place];  Service: Vascular;  Laterality: Left;      Current Outpatient Medications  Medication Sig Dispense Refill   ACCU-CHEK GUIDE test strip 2 (two) times daily. as directed     Accu-Chek Softclix Lancets lancets 2 (two) times daily.     albuterol (VENTOLIN HFA) 108 (90 Base) MCG/ACT inhaler Inhale into the lungs.     amLODipine (NORVASC) 10 MG tablet Take 1 tablet (10 mg total) by mouth daily. 30 tablet 0   ARIPiprazole (ABILIFY) 5 MG tablet Take 5 mg by mouth daily.     aspirin EC 81 MG EC tablet Take 1 tablet (81 mg total) by mouth daily.     atorvastatin (LIPITOR) 40 MG tablet Take 2 tablets (80 mg total) by mouth daily at 6 PM.     Blood Glucose Monitoring Suppl (ACCU-CHEK GUIDE) w/Device KIT USE AS DIRECTED TO CHECK BLOOD SUGAR     carboxymethylcellulose (REFRESH PLUS) 0.5 % SOLN 1 drop at bedtime.     cephALEXin (KEFLEX) 500 MG capsule Take 1 capsule (500 mg total) by mouth 2 (two) times daily for 5 days. 10 capsule 0   cloNIDine (CATAPRES) 0.2 MG tablet Take 0.2 mg by mouth 2 (two) times daily. For 7 days     furosemide (LASIX) 20 MG tablet  Take 20 mg by mouth daily.     hydrALAZINE (APRESOLINE) 100 MG tablet Take 1 tablet (100 mg total) by mouth 3 (three) times daily. 90 tablet 0   insulin aspart (NOVOLOG) 100 UNIT/ML injection Inject 0-9 Units into the skin 3 (three) times daily with meals. Correction coverage: Sensitive (thin, NPO, renal) CBG < 70: Implement Hypoglycemia protocol. CBG 70 - 120: 0 units CBG 121 - 150: 1 unit CBG 151 - 200: 2 units CBG 201 - 250: 3 units CBG 251 - 300: 5 units CBG 301 - 350: 7 units CBG 351 - 400: 9 units CBG > 400: call MD.     insulin glargine (LANTUS) 100 UNIT/ML injection Inject 0.1 mLs (10 Units total) into the skin daily. 10 mL 0   lisinopril (ZESTRIL) 40 MG tablet Take 1 tablet (40 mg total) by mouth daily. 30 tablet 0   magnesium hydroxide (MILK OF MAGNESIA) 400 MG/5ML suspension Take by mouth daily as needed for mild constipation.     metoprolol succinate (TOPROL-XL) 50 MG 24  hr tablet Take 50 mg by mouth every morning.     mirtazapine (REMERON) 30 MG tablet Take 30 mg by mouth at bedtime.     pantoprazole (PROTONIX) 40 MG tablet Take 1 tablet (40 mg total) by mouth daily.     PEG 3350-KCl-NaBcb-NaCl-NaSulf (PEG-3350/ELECTROLYTES) 236 g SOLR SMARTSIG:Milliliter(s) By Mouth     polyethylene glycol (MIRALAX / GLYCOLAX) 17 g packet Take 17 g by mouth daily.     traMADol (ULTRAM) 50 MG tablet Take by mouth every 6 (six) hours as needed.     No current facility-administered medications for this visit.    Allergies:   Morphine and related and Metformin    Social History:  The patient  reports that she has quit smoking. Her smoking use included cigarettes. She has a 7.00 pack-year smoking history. She has never used smokeless tobacco. She reports current drug use. Drugs: Marijuana and "Crack" cocaine. She reports that she does not drink alcohol.   Family History:  The patient's family history includes Heart attack (age of onset: 9) in her mother; Stroke (age of onset: 15) in her brother and brother; Stroke (age of onset: 51) in her father.    ROS:  Please see the history of present illness.   Otherwise, review of systems are positive for none.   All other systems are reviewed and negative.    PHYSICAL EXAM: VS:  BP (!) 147/93 (BP Location: Right Arm, Patient Position: Sitting, Cuff Size: Large)   Pulse 62   Ht 5' (1.524 m)   Wt 248 lb (112.5 kg)   LMP 04/26/2018 (Approximate)   SpO2 97%   BMI 48.43 kg/m  , BMI Body mass index is 48.43 kg/m. GEN: Well nourished, well developed, in no acute distress HEENT: Sclera anicteric Neck: no JVD, carotid bruits, or masses Cardiac: RRR; no murmurs, rubs, or gallops, 1+ LE edema  Respiratory:  clear to auscultation bilaterally, normal work of breathing GI: soft, nontender, nondistended, + BS MS: no deformity or atrophy Skin: warm and dry, no rash Neuro: Dysarthria and left-sided hemiparesis Psych: euthymic mood, full  affect   EKG:  EKG is not ordered today.   Recent Labs: 01/18/2021: Magnesium 2.0 07/05/2021: ALT 21; BUN 19; Creatinine, Ser 1.53; Hemoglobin 10.2; Platelets 286; Potassium 3.5; Sodium 139    Lipid Panel    Component Value Date/Time   CHOL 143 01/16/2021 2316   TRIG 57 01/16/2021 2316   HDL  72 01/16/2021 2316   CHOLHDL 2.0 01/16/2021 2316   VLDL 11 01/16/2021 2316   LDLCALC 60 01/16/2021 2316      Wt Readings from Last 3 Encounters:  07/08/21 248 lb (112.5 kg)  07/05/21 240 lb (108.9 kg)  05/22/21 230 lb (104.3 kg)      Other studies Reviewed: Additional studies/ records that were reviewed today include:   LEAs/ABIs 02/2021: Summary:  Left: Mild athereosclerosis noted throughout extremity. Left ATA appears  to have decreased blood flow in the mid to distal portion with a  collateral noted distally.   Echocardiogram 12/2020: 1. Left ventricular ejection fraction, by estimation, is 60 to 65%. The  left ventricle has normal function. The left ventricle has no regional  wall motion abnormalities. There is moderate left ventricular hypertrophy.  Left ventricular diastolic  parameters are consistent with Grade I diastolic dysfunction (impaired  relaxation).   2. Right ventricular systolic function is normal. The right ventricular  size is normal.   3. Left atrial size was mildly dilated.   4. The mitral valve is normal in structure. No evidence of mitral valve  regurgitation. No evidence of mitral stenosis.   5. Left coronary cusp calcification. The aortic valve is tricuspid. There  is mild calcification of the aortic valve. There is mild thickening of the  aortic valve. Aortic valve regurgitation is moderate to severe. No aortic  stenosis is present.   6. The inferior vena cava is normal in size with greater than 50%  respiratory variability, suggesting right atrial pressure of 3 mmHg.   Conclusion(s)/Recommendation(s): No intracardiac source of embolism  detected  on this transthoracic study. A transesophageal echocardiogram is  recommended to exclude cardiac source of embolism if clinically indicated.   NST 2013: IMPRESSION:   1.  Mild dilatation of the left ventricle with slightly decreased  ejection fraction (56%) compared with prior study.  2.  No reversible perfusion defects to suggest myocardial ischemia.   ASSESSMENT AND PLAN:  1.  HTN: BP 147/93 today.  She does not monitor blood pressures regularly at her assisted living facility.  She noted having markedly elevated blood pressures during an ED visit 04/2021.  She wonders if clonidine is contributing to her daytime somnolence. -Will transition from metoprolol succinate to carvedilol 12.5 mg twice daily -Continue clonidine, hydralazine, lisinopril, amlodipine, and Lasix -Recommend twice daily blood pressure monitoring at her assisted living facility with plans to bring her log when seen by pharmacy for hypertension management -Encouraged low-salt diet  2.  HLD: LDL 60 12/2020  -Continue atorvastatin  3.  PAD s/p extensive LLE revascularization in 2019: Followed by Dr. Donzetta Matters, last evaluated 03/2021 following abnormal ABIs 02/2021.  Recommended to avoid further intervention unless unbearable pain or early healing wounds -Continue aspirin and statin  4.  DM type II: A1C 7.6 12/2020 - Continue insulin   5. History of CVA: Has residual left-sided hemiparesis and dysarthria.  No new neurologic complaints. -Continue aspirin and statin  6.  Polysubstance abuse: -Continue to encourage abstinence  7.  Aortic insufficiency: Moderate to severe on last echo 12/2020 -Continue routine surveillance imaging -anticipate repeat study 12/2021  8.  CKD stage IIIb: Creatinine 1.5 on labs 07/05/2021 -Continue to limit nephrotoxic agents   Current medicines are reviewed at length with the patient today.  The patient does not have concerns regarding medicines.  The following changes have been made:  As  above  Labs/ tests ordered today include:  No orders of the defined types  were placed in this encounter.    Disposition:   FU with pharmacy for hypertension management in 2 to 4 weeks and Dr. Percival Spanish in 4 months  Signed, Abigail Butts, PA-C  07/08/2021 2:59 PM

## 2021-07-28 ENCOUNTER — Other Ambulatory Visit (INDEPENDENT_AMBULATORY_CARE_PROVIDER_SITE_OTHER): Payer: Self-pay | Admitting: Nurse Practitioner

## 2021-07-28 DIAGNOSIS — I739 Peripheral vascular disease, unspecified: Secondary | ICD-10-CM

## 2021-07-28 DIAGNOSIS — Z9889 Other specified postprocedural states: Secondary | ICD-10-CM

## 2021-07-29 ENCOUNTER — Emergency Department
Admission: EM | Admit: 2021-07-29 | Discharge: 2021-07-30 | Disposition: A | Discharge status: Skilled Nursing Facility | Payer: Medicare Other | Attending: Emergency Medicine | Admitting: Emergency Medicine

## 2021-07-29 ENCOUNTER — Encounter (INDEPENDENT_AMBULATORY_CARE_PROVIDER_SITE_OTHER): Payer: Medicare Other | Admitting: Nurse Practitioner

## 2021-07-29 ENCOUNTER — Encounter (INDEPENDENT_AMBULATORY_CARE_PROVIDER_SITE_OTHER): Payer: Medicare Other

## 2021-07-29 ENCOUNTER — Emergency Department: Payer: Medicare Other

## 2021-07-29 ENCOUNTER — Other Ambulatory Visit: Payer: Self-pay

## 2021-07-29 DIAGNOSIS — J690 Pneumonitis due to inhalation of food and vomit: Secondary | ICD-10-CM | POA: Diagnosis not present

## 2021-07-29 DIAGNOSIS — E119 Type 2 diabetes mellitus without complications: Secondary | ICD-10-CM | POA: Diagnosis not present

## 2021-07-29 DIAGNOSIS — Z79899 Other long term (current) drug therapy: Secondary | ICD-10-CM | POA: Insufficient documentation

## 2021-07-29 DIAGNOSIS — Z794 Long term (current) use of insulin: Secondary | ICD-10-CM | POA: Diagnosis not present

## 2021-07-29 DIAGNOSIS — Z20822 Contact with and (suspected) exposure to covid-19: Secondary | ICD-10-CM | POA: Diagnosis not present

## 2021-07-29 DIAGNOSIS — R0789 Other chest pain: Secondary | ICD-10-CM | POA: Diagnosis present

## 2021-07-29 DIAGNOSIS — Z87891 Personal history of nicotine dependence: Secondary | ICD-10-CM | POA: Insufficient documentation

## 2021-07-29 DIAGNOSIS — I10 Essential (primary) hypertension: Secondary | ICD-10-CM | POA: Insufficient documentation

## 2021-07-29 DIAGNOSIS — R600 Localized edema: Secondary | ICD-10-CM | POA: Insufficient documentation

## 2021-07-29 DIAGNOSIS — Z7982 Long term (current) use of aspirin: Secondary | ICD-10-CM | POA: Diagnosis not present

## 2021-07-29 LAB — BASIC METABOLIC PANEL
Anion gap: 8 (ref 5–15)
BUN: 16 mg/dL (ref 6–20)
CO2: 29 mmol/L (ref 22–32)
Calcium: 9.6 mg/dL (ref 8.9–10.3)
Chloride: 102 mmol/L (ref 98–111)
Creatinine, Ser: 1.1 mg/dL — ABNORMAL HIGH (ref 0.44–1.00)
GFR, Estimated: >60 mL/min (ref >60)
Glucose, Bld: 137 mg/dL — ABNORMAL HIGH (ref 70–99)
Potassium: 3.3 mmol/L — ABNORMAL LOW (ref 3.5–5.1)
Sodium: 139 mmol/L (ref 135–145)

## 2021-07-29 LAB — TROPONIN I (HIGH SENSITIVITY)
Troponin I (High Sensitivity): 3 ng/L (ref <18)
Troponin I (High Sensitivity): 4 ng/L (ref <18)

## 2021-07-29 LAB — CBC
HCT: 39.2 % (ref 36.0–46.0)
Hemoglobin: 11.9 g/dL — ABNORMAL LOW (ref 12.0–15.0)
MCH: 22.1 pg — ABNORMAL LOW (ref 26.0–34.0)
MCHC: 30.4 g/dL (ref 30.0–36.0)
MCV: 72.9 fL — ABNORMAL LOW (ref 80.0–100.0)
Platelets: 331 10*3/uL (ref 150–400)
RBC: 5.38 MIL/uL — ABNORMAL HIGH (ref 3.87–5.11)
RDW: 17.4 % — ABNORMAL HIGH (ref 11.5–15.5)
WBC: 8.6 10*3/uL (ref 4.0–10.5)
nRBC: 0 % (ref 0.0–0.2)

## 2021-07-29 LAB — RESP PANEL BY RT-PCR (FLU A&B, COVID) ARPGX2
Influenza A by PCR: NEGATIVE
Influenza B by PCR: NEGATIVE
SARS Coronavirus 2 by RT PCR: NEGATIVE

## 2021-07-29 LAB — BRAIN NATRIURETIC PEPTIDE: B Natriuretic Peptide: 26.8 pg/mL (ref 0.0–100.0)

## 2021-07-29 MED ORDER — LIDOCAINE VISCOUS HCL 2 % MT SOLN
15.0000 mL | Freq: Once | OROMUCOSAL | Status: AC
  Administered 2021-07-29: 15 mL via ORAL
  Filled 2021-07-29: qty 15

## 2021-07-29 MED ORDER — IOHEXOL 350 MG/ML SOLN
100.0000 mL | Freq: Once | INTRAVENOUS | Status: AC | PRN
  Administered 2021-07-29: 100 mL via INTRAVENOUS

## 2021-07-29 MED ORDER — IPRATROPIUM-ALBUTEROL 0.5-2.5 (3) MG/3ML IN SOLN
3.0000 mL | Freq: Once | RESPIRATORY_TRACT | Status: AC
  Administered 2021-07-29: 3 mL via RESPIRATORY_TRACT
  Filled 2021-07-29: qty 3

## 2021-07-29 MED ORDER — AMOXICILLIN-POT CLAVULANATE 875-125 MG PO TABS: 1.0000 | ORAL_TABLET | Freq: Two times a day (BID) | ORAL | 0 refills | Status: AC

## 2021-07-29 MED ORDER — PIPERACILLIN-TAZOBACTAM 3.375 G IVPB 30 MIN
3.3750 g | Freq: Once | INTRAVENOUS | Status: AC
  Administered 2021-07-29: 3.375 g via INTRAVENOUS
  Filled 2021-07-29: qty 50

## 2021-07-29 MED ORDER — METHYLPREDNISOLONE SODIUM SUCC 125 MG IJ SOLR
125.0000 mg | Freq: Once | INTRAMUSCULAR | Status: AC
  Administered 2021-07-29: 125 mg via INTRAVENOUS
  Filled 2021-07-29: qty 2

## 2021-07-29 MED ORDER — ALUM & MAG HYDROXIDE-SIMETH 200-200-20 MG/5ML PO SUSP
30.0000 mL | Freq: Once | ORAL | Status: AC
  Administered 2021-07-29: 30 mL via ORAL
  Filled 2021-07-29: qty 30

## 2021-07-29 MED ORDER — TRAMADOL HCL 50 MG PO TABS
50.0000 mg | ORAL_TABLET | Freq: Once | ORAL | Status: AC
  Administered 2021-07-29: 50 mg via ORAL
  Filled 2021-07-29: qty 1

## 2021-07-29 MED ORDER — ALBUTEROL SULFATE (2.5 MG/3ML) 0.083% IN NEBU: 2.5000 mg | INHALATION_SOLUTION | RESPIRATORY_TRACT | 0 refills | Status: DC | PRN

## 2021-07-29 MED ORDER — PREDNISONE 20 MG PO TABS: 40.0000 mg | ORAL_TABLET | Freq: Every day | ORAL | 0 refills | Status: AC

## 2021-07-29 NOTE — ED Provider Notes (Signed)
[facility] Emergency Department Provider Note  ____________________________________________   Event Date/Time   First MD Initiated Contact with Patient 07/29/21 1925     (approximate)  I have reviewed the triage vital signs and the nursing notes.   HISTORY  Chief Complaint Chest Pain    HPI Diamond Rice is a 50 y.o. female  with h/o COPD, HTN, CVA, here with cough, chest pressure. Pt reports that her sx started this AM with chest pressure, SOB. She has felt intermittent, middle and left sided CP since then with SOB. She has been wheezing. No diaphoresis. No n/v. No h/o CAD that she is aware of. No known sick contacts btu does live in a SNF 2/2 her CVA. No new numbness, weakness. Sx worse with movement/exertion in bed (sitting up). No alleviating factors.        Past Medical History:  Diagnosis Date   Acute ischemic stroke ([place]) 12/27/2020   Acute renal failure (ARF) ([place]) 04/02/2018   Anxiety    Chronic lower back pain 04/17/2012   "just got over some; catched when I walked"   COPD (chronic obstructive pulmonary disease) ([place])    Critical lower limb ischemia ([place]) 05/27/2018   Depression    Headache(784.0) 04/17/2012   "~ qod; lately waking up in am w/one"   High cholesterol    Hypertension    Migraines 04/17/2012   Obesity    Sleep apnea    CPAP   Stroke ([place])    Type II diabetes mellitus ([place]) 08/28/2002    Patient Active Problem List   Diagnosis Date Noted   Acute CVA (cerebrovascular accident) ([place]) 01/18/2021   Leukocytosis 01/17/2021   Dysarthria    Acute lower UTI    Abnormal finding on lung imaging 12/27/2020   Severe episode of recurrent major depressive disorder, with psychotic features ([place])    Cerebrovascular accident (CVA) ([place]) 12/06/2019   AKI (acute kidney injury) ([place]) 12/06/2019   LVH (left ventricular hypertrophy) 08/10/2019   Edema of lower extremity 11/26/2018   Loss of appetite 11/26/2018   Urinary incontinence  11/26/2018   Allergic rhinitis 11/14/2018   Deep venous thrombosis ([place]) 11/14/2018   Nicotine dependence 11/14/2018   Hemorrhagic stroke (HCC)    Diastolic dysfunction    Type 2 diabetes mellitus (HCC)    Anemia of chronic disease    Chronic obstructive pulmonary disease (HCC)    ICH (intracerebral hemorrhage) ([place]) 08/30/2018   Hypertensive heart disease without heart failure 07/04/2018   Nonrheumatic aortic valve insufficiency 07/04/2018   Esophageal abnormality    Class 2 severe obesity due to excess calories with serious comorbidity and body mass index (BMI) of 35.0 to 35.9 in adult [facility])    Peripheral vascular disease ([place])    History of osteomyelitis    History of amputation of lesser toe ([place])    Pulmonary embolism ([place]) 06/22/2018   Microcytic anemia 06/17/2018   At risk for adverse drug reaction 06/11/2018   Critical lower limb ischemia ([place]) 05/27/2018   GI bleeding 04/02/2018   Hypokalemia 04/02/2018   Polysubstance abuse ([place]) 04/02/2018   Diabetic foot ulcer ([place]) 04/02/2018   Tinea pedis 03/07/2018   Diabetic peripheral neuropathy ([place]) 01/24/2018   Cannabis use disorder, moderate, dependence ([place]) 05/19/2017   Cocaine use disorder, moderate, dependence ([place]) 05/19/2017   CKD (chronic kidney disease) stage 3, GFR 30-59 ml/min ([place]) 02/17/2017   Noncompliance with medications 02/17/2017   Suicide attempt ([place]) 11/06/2016   Mild cognitive impairment 03/21/2016  Moderate episode of recurrent major depressive disorder ([place]) 03/21/2016   B12 deficiency 03/09/2016   Iron deficiency anemia 03/07/2016   History of CVA with residual deficit 12/30/2015   Schizoaffective disorder, bipolar type ([place]) 10/07/2015   OSA (obstructive sleep apnea) 06/09/2015   Vitamin D deficiency 01/06/2015   Bilateral knee pain 01/05/2015   Numbness and tingling in right hand 01/05/2015   Insomnia 12/07/2014   Right-sided lacunar infarction ([place]) 09/15/2014   Left hemiparesis ([place])  09/15/2014   Dyspnea    Back pain 09/11/2013   Psychoactive substance-induced organic mood disorder ([place]) 05/28/2013   Cocaine abuse with cocaine-induced mood disorder ([place]) 05/28/2013   Cannabis dependence with cannabis-induced anxiety disorder ([place]) 05/28/2013   Morbid obesity with BMI of 45.0-49.9, adult ([place]) 04/25/2012   Chest pain 04/17/2012   Hypertension 04/17/2012   DM (diabetes mellitus) with peripheral vascular complication ([place]) 28/36/6294   Hyperlipidemia 04/17/2012   Tobacco use 04/17/2012    Past Surgical History:  Procedure Laterality Date   ABDOMINAL AORTOGRAM W/LOWER EXTREMITY Left 05/27/2018   Procedure: ABDOMINAL AORTOGRAM W/LOWER EXTREMITY [place] and Possible Intervention;  Surgeon: Waynetta Sandy, MD;  Location: [place] [facility];  Service: Cardiovascular;  Laterality: Left;   APPLICATION OF WOUND VAC Left 06/06/2018   Procedure: APPLICATION OF WOUND VAC;  Surgeon: Waynetta Sandy, MD;  Location: [place];  Service: Vascular;  Laterality: Left;   BALLOON DILATION N/A 12/31/2020   Procedure: [place];  Surgeon: Ronnette Juniper, MD;  Location: [place];  Service: Gastroenterology;  Laterality: N/A;   BIOPSY  04/04/2018   Procedure: BIOPSY;  Surgeon: [name], MD;  Location: [facility];  Service: Endoscopy;;   BIOPSY  10/27/2020   Procedure: BIOPSY;  Surgeon: Arta Silence, MD;  Location: [facility]OS[facility];  Service: Endoscopy;;   BOTOX INJECTION N/A 12/31/2020   Procedure: BOTOX INJECTION;  Surgeon: Ronnette Juniper, MD;  Location: [place];  Service: Gastroenterology;  Laterality: N/A;   CARDIAC CATHETERIZATION  ~ 2011   COLONOSCOPY N/A 04/04/2018   Procedure: COLONOSCOPY;  Surgeon: [name], MD;  Location: [facility] ENDOSCOPY;  Service: Endoscopy;  Laterality: N/A;   COLONOSCOPY WITH PROPOFOL N/A 10/27/2020   Procedure: COLONOSCOPY WITH PROPOFOL;  Surgeon: Arta Silence, MD;  Location: [facility];  Service: Endoscopy;  Laterality: N/A;    ESOPHAGOGASTRODUODENOSCOPY N/A 12/31/2020   Procedure: ESOPHAGOGASTRODUODENOSCOPY (EGD);  Surgeon: Ronnette Juniper, MD;  Location: [place];  Service: Gastroenterology;  Laterality: N/A;   ESOPHAGOGASTRODUODENOSCOPY (EGD) WITH PROPOFOL N/A 10/27/2020   Procedure: ESOPHAGOGASTRODUODENOSCOPY (EGD) WITH PROPOFOL  WITH POSSIBLE DIL;  Surgeon: Arta Silence, MD;  Location: [facility];  Service: Endoscopy;  Laterality: N/A;   FOREIGN BODY REMOVAL  12/31/2020   Procedure: FOREIGN BODY REMOVAL;  Surgeon: Ronnette Juniper, MD;  [facility] ENDOSCOPY;  Service: Gastroenterology;;   LOWER EXTREMITY ANGIOGRAM Left 05/27/2018   PERIPHERAL VASCULAR INTERVENTION Left 05/27/2018   Procedure: PERIPHERAL VASCULAR INTERVENTION;  Surgeon: Waynetta Sandy, MD;  Location: [place] [facility];  Service: Cardiovascular;  Laterality: Left;   POLYPECTOMY  04/04/2018   Procedure: POLYPECTOMY;  Surgeon: [name], MD;  Location: [facility] ENDOSCOPY;  Service: Endoscopy;;   POLYPECTOMY  10/27/2020   Procedure: POLYPECTOMY;  Surgeon: Arta Silence, MD;  Location: [facility]OS[facility];  Service: Endoscopy;;   TRANSMETATARSAL AMPUTATION Left 05/28/2018   Procedure: AMPUTATION TOES THREE, FOUR AND FIVE on left foot;  Surgeon: Waynetta Sandy, MD;  Location: [place];  Service: Vascular;  Laterality: Left;   WOUND DEBRIDEMENT Left 06/06/2018   Procedure:  DEBRIDEMENT WOUND LEFT FOOT;  Surgeon: Waynetta Sandy, MD;  Location: [place];  Service: Vascular;  Laterality: Left;    Prior to Admission medications   Medication Sig Start Date End Date Taking? Authorizing Provider  albuterol (PROVENTIL) (2.5 MG/3ML) 0.083% nebulizer solution Take 3 mLs (2.5 mg total) by nebulization every 4 (four) hours as needed for wheezing or shortness of breath. 07/29/21 07/29/22 Yes Duffy Bruce, MD  amoxicillin-clavulanate (AUGMENTIN) 875-125 MG tablet Take 1 tablet by mouth 2 (two) times daily for 7 days. 07/29/21 08/05/21 Yes Duffy Bruce, MD   predniSONE (DELTASONE) 20 MG tablet Take 2 tablets (40 mg total) by mouth daily for 5 days. 07/29/21 08/03/21 Yes Duffy Bruce, MD  ACCU-CHEK GUIDE test strip 2 (two) times daily. as directed 02/06/20   [provider]  Accu-Chek Softclix Lancets lancets 2 (two) times daily. 02/06/20   [provider]  amLODipine (NORVASC) 10 MG tablet Take 1 tablet (10 mg total) by mouth daily. 01/22/20   [place] Hawthorn, NP  ARIPiprazole (ABILIFY) 5 MG tablet Take 5 mg by mouth daily. 08/28/20   [provider]  aspirin EC 81 MG EC tablet Take 1 tablet (81 mg total) by mouth daily. 09/21/18   Donzetta Starch, NP  atorvastatin (LIPITOR) 40 MG tablet Take 2 tablets (80 mg total) by mouth daily at 6 PM. 01/03/21   Hongalgi, Lenis Dickinson, MD  Blood Glucose Monitoring Suppl (ACCU-CHEK GUIDE) w/Device KIT USE AS DIRECTED TO CHECK BLOOD SUGAR 02/06/20   [provider]  carboxymethylcellulose (REFRESH PLUS) 0.5 % SOLN 1 drop at bedtime.    [provider]  carvedilol (COREG) 12.5 MG tablet Take 1 tablet (12.5 mg total) by mouth 2 (two) times daily. 07/08/21 10/06/21  Kroeger, Lorelee Cover., PA-C  cloNIDine (CATAPRES) 0.2 MG tablet Take 0.2 mg by mouth 2 (two) times daily. For 7 days 01/11/21 07/08/21  [provider]  furosemide (LASIX) 20 MG tablet Take 20 mg by mouth daily. 06/10/21   [provider]  hydrALAZINE (APRESOLINE) 100 MG tablet Take 1 tablet (100 mg total) by mouth 3 (three) times daily. 01/22/20   Royal Hawthorn, NP  insulin aspart (NOVOLOG) 100 UNIT/ML injection Inject 0-9 Units into the skin 3 (three) times daily with meals. Correction coverage: Sensitive (thin, NPO, renal) CBG < 70: Implement Hypoglycemia protocol. CBG 70 - 120: 0 units CBG 121 - 150: 1 unit CBG 151 - 200: 2 units CBG 201 - 250: 3 units CBG 251 - 300: 5 units CBG 301 - 350: 7 units CBG 351 - 400: 9 units CBG > 400: call MD. 01/03/21   Modena Jansky, MD  insulin glargine (LANTUS) 100  UNIT/ML injection Inject 0.1 mLs (10 Units total) into the skin daily. 01/21/21   Nolberto Hanlon, MD  lisinopril (ZESTRIL) 40 MG tablet Take 1 tablet (40 mg total) by mouth daily. 01/22/20   Royal Hawthorn, NP  magnesium hydroxide (MILK OF MAGNESIA) 400 MG/5ML suspension Take by mouth daily as needed for mild constipation.    [provider]  mirtazapine (REMERON) 30 MG tablet Take 30 mg by mouth at bedtime.    [provider]  pantoprazole (PROTONIX) 40 MG tablet Take 1 tablet (40 mg total) by mouth daily. 01/03/21   Hongalgi, Lenis Dickinson, MD  PEG 3350-KCl-NaBcb-NaCl-NaSulf (PEG-3350/ELECTROLYTES) 236 g SOLR SMARTSIG:Milliliter(s) By Mouth 01/13/21   [provider]  polyethylene glycol (MIRALAX / GLYCOLAX) 17 g packet Take 17 g by mouth daily.    [provider]  traMADol (ULTRAM) 50 MG tablet Take by mouth every 6 (six) hours as needed.    [provider]    Allergies Morphine and related and Metformin  Family History  Problem Relation Age of Onset   Stroke Brother 87   Stroke Brother 35   Heart attack Mother 70   Stroke Father 69    Social History Social History   Tobacco Use   Smoking status: Former    Packs/day: 0.25    Years: 28.00    Pack years: 7.00    Types: Cigarettes   Smokeless tobacco: Never  Vaping Use   Vaping Use: Never used  Substance Use Topics   Alcohol use: No    Alcohol/week: 0.0 standard drinks    Comment: rare   Drug use: Yes    Types: Marijuana, "Crack" cocaine    Comment: marijuana last use days ago; last cocaine several months ago    Review of Systems  Review of Systems  Constitutional:  Positive for fatigue. Negative for chills and fever.  HENT:  Negative for sore throat.   Respiratory:  Positive for chest tightness and shortness of breath.   Cardiovascular:  Positive for chest pain.  Gastrointestinal:  Negative for abdominal pain.  Genitourinary:  Negative for flank pain.  Musculoskeletal:  Negative for  neck pain.  Skin:  Negative for rash and wound.  Allergic/Immunologic: Negative for immunocompromised state.  Neurological:  Positive for weakness. Negative for numbness.  Hematological:  Does not bruise/bleed easily.  All other systems reviewed and are negative.   ____________________________________________  PHYSICAL EXAM:      VITAL SIGNS: ED Triage Vitals [07/29/21 1855]  Enc Vitals Group     BP (!) 157/97     Pulse Rate 93     Resp 17     Temp 98.7 F (37.1 C)     Temp Source Oral     SpO2 98 %     Weight      Height      Head Circumference      Peak Flow      Pain Score      Pain Loc      Pain Edu?      Excl. in [place]?      Physical Exam Vitals and nursing note reviewed.  Constitutional:      General: She is not in acute distress.    Appearance: She is well-developed.  HENT:     Head: Normocephalic and atraumatic.  Eyes:     Conjunctiva/sclera: Conjunctivae normal.  Cardiovascular:     Rate and Rhythm: Normal rate and regular rhythm.     Heart sounds: Normal heart sounds.  Pulmonary:     Effort: Pulmonary effort is normal. Tachypnea present. No respiratory distress.     Breath sounds: Decreased breath sounds and wheezing present.  Abdominal:     General: There is no distension.  Musculoskeletal:     Cervical back: Neck supple.  Skin:    General: Skin is warm.     Capillary Refill: Capillary refill takes less than 2 seconds.     Findings: No rash.  Neurological:     Mental Status: She is alert and oriented to person, place, and time.     Motor: No abnormal muscle tone.      ____________________________________________   LABS (all labs ordered are listed, but only abnormal results are displayed)  Labs Reviewed  BASIC METABOLIC PANEL - Abnormal; Notable for the following  components:      Result Value   Potassium 3.3 (*)    Glucose, Bld 137 (*)    Creatinine, Ser 1.10 (*)    All other components within normal limits  CBC - Abnormal; Notable for  the following components:   RBC 5.38 (*)    Hemoglobin 11.9 (*)    MCV 72.9 (*)    MCH 22.1 (*)    RDW 17.4 (*)    All other components within normal limits  RESP PANEL BY RT-PCR (FLU A&B, COVID) ARPGX2  BRAIN NATRIURETIC PEPTIDE  POC URINE PREG, ED  TROPONIN I (HIGH SENSITIVITY)  TROPONIN I (HIGH SENSITIVITY)    ____________________________________________  EKG: Normal sinus rhythm, VR 89. PR 240, QRS 88, QTc 459. LVH. No acute St elevations. ________________________________________  RADIOLOGY All imaging, including plain films, CT scans, and ultrasounds, independently reviewed by me, and interpretations confirmed via formal radiology reads.  ED MD interpretation:   CXR: Cardiomegaly, no PNA, no PTX  Official radiology report(s): DG Chest 2 View  Result Date: 07/29/2021 CLINICAL DATA:  Chest pain EXAM: CHEST - 2 VIEW COMPARISON:  Chest x-ray 01/17/2021 FINDINGS: Heart is enlarged. Mediastinum appears stable. Pulmonary vasculature is within normal limits. Linear opacities in the left lower lung zone and right lung base likely represent platelike atelectasis. No pleural effusion or pneumothorax visualized. IMPRESSION: Cardiomegaly with subsegmental atelectatic changes. Electronically Signed   By: Ofilia Neas M.D.   On: 07/29/2021 14:12   CT Angio Chest PE W and/or Wo Contrast  Result Date: 07/29/2021 CLINICAL DATA:  Crushing chest pain EXAM: CT ANGIOGRAPHY CHEST WITH CONTRAST TECHNIQUE: Multidetector CT imaging of the chest was performed using the standard protocol during bolus administration of intravenous contrast. Multiplanar CT image reconstructions and MIPs were obtained to evaluate the vascular anatomy. CONTRAST:  138mL OMNIPAQUE IOHEXOL 350 MG/ML SOLN COMPARISON:  Chest x-ray 07/29/2021, CT chest 12/29/2020 esophagram 12/30/2020 FINDINGS: Cardiovascular: Satisfactory opacification of the pulmonary arteries to the segmental level. No evidence of pulmonary embolism.  Nonaneurysmal aorta. Mild atherosclerosis. Cardiomegaly. No pericardial effusion. Mild coronary vascular calcification. Mediastinum/Nodes: Midline trachea. No thyroid mass. Patulous esophagus with air in fluid distension. Mild diffuse circumferential esophageal thickening. No suspicious nodes Lungs/Pleura: No pleural effusion or pneumothorax. Patchy slightly nodular densities in the right lower lobe. Upper Abdomen: No acute abnormality. Musculoskeletal: No chest wall abnormality. No acute or significant osseous findings. Review of the MIP images confirms the above findings. IMPRESSION: 1. Negative for acute pulmonary embolus or aortic dissection. 2. Air and fluid distension of the esophagus with mild circumferential mid to distal esophageal wall thickening, see esophagram from May of 2022. Patchy somewhat nodular densities most evident in the right lower lobe could reflect pneumonia versus sequelae of aspiration 3. Cardiomegaly Aortic Atherosclerosis (ICD10-I70.0). Electronically Signed   By: Donavan Foil M.D.   On: 07/29/2021 20:51    ____________________________________________  PROCEDURES   Procedure(s) performed (including Critical Care):  Procedures  ____________________________________________  INITIAL IMPRESSION / MDM / [place] / ED COURSE  As part of my medical decision making, I reviewed the following data within the [facility] notes reviewed and incorporated, Old chart reviewed, Notes from prior ED visits, and [place] Controlled Substance Database       *Buffie Herne was evaluated in Emergency Department on 07/29/2021 for the symptoms described in the history of present illness. She was evaluated in the context of the global COVID-19 pandemic, which necessitated consideration that the patient might be at  risk for infection with the SARS-CoV-2 virus that causes COVID-19. Institutional protocols and algorithms that pertain to the evaluation of patients  at risk for COVID-19 are in a state of rapid change based on information released by regulatory bodies including the CDC and federal and state organizations. These policies and algorithms were followed during the patient's care in the ED.  Some ED evaluations and interventions may be delayed as a result of limited staffing during the pandemic.*     Medical Decision Making:  50 yo F here with SOB, chest pressure. DDx broad, includes COPD exacerbation, PNA, CHF, less likely ACS as EKG nonischemic and initial trop negative. CXR shows atelectasis. PE also a consideration. Will check delta trop, CT Angio, and monitor. Labs o/w reassuring. No leukocytosis or significant change in anemia. BMP unremarkable.   CT angio obtained, shows possible aspiration. Pt has known achalasia which likely contributed. Suspect thsi could have contributed to her acute onset of SOB and CP. She is now back to baseline and denies any ongoing CP or SOB. She is satting >98% on RA with normal WOB. Trop neg. BNP neg. No signs of sepsis. Will d/c with augmentin, also brief course of prednisone and albuterol as I suspect she does have a component of bronchospasm. D/c with instructions to SNF.   ____________________________________________  FINAL CLINICAL IMPRESSION(S) / ED DIAGNOSES  Final diagnoses:  Atypical chest pain  Aspiration pneumonia of right lower lobe due to gastric secretions ([place])     MEDICATIONS GIVEN DURING THIS VISIT:  Medications  ipratropium-albuterol (DUONEB) 0.5-2.5 (3) MG/3ML nebulizer solution 3 mL (3 mLs Nebulization Given 07/29/21 2000)  ipratropium-albuterol (DUONEB) 0.5-2.5 (3) MG/3ML nebulizer solution 3 mL (3 mLs Nebulization Given 07/29/21 2000)  iohexol (OMNIPAQUE) 350 MG/ML injection 100 mL (100 mLs Intravenous Contrast Given 07/29/21 2032)  alum & mag hydroxide-simeth (MAALOX/MYLANTA) 200-200-20 MG/5ML suspension 30 mL (30 mLs Oral Given 07/29/21 2211)    And  lidocaine (XYLOCAINE) 2 % viscous  mouth solution 15 mL (15 mLs Oral Given 07/29/21 2211)  methylPREDNISolone sodium succinate (SOLU-MEDROL) 125 mg/2 mL injection 125 mg (125 mg Intravenous Given 07/29/21 2213)  piperacillin-tazobactam (ZOSYN) IVPB 3.375 g (0 g Intravenous Stopped 07/29/21 2305)  traMADol (ULTRAM) tablet 50 mg (50 mg Oral Given 07/29/21 2206)     ED Discharge Orders          Ordered    amoxicillin-clavulanate (AUGMENTIN) 875-125 MG tablet  2 times daily        07/29/21 2204    predniSONE (DELTASONE) 20 MG tablet  Daily        07/29/21 2204    albuterol (PROVENTIL) (2.5 MG/3ML) 0.083% nebulizer solution  Every 4 hours PRN        07/29/21 2204             Note:  This document was prepared using Dragon voice recognition software and may include unintentional dictation errors.   Duffy Bruce, MD 07/29/21 [phone]

## 2021-07-29 NOTE — ED Notes (Signed)
Pt. Placed on cont. [place] monitoring

## 2021-07-29 NOTE — Discharge Instructions (Addendum)
Follow-up with doctor at the facility in 2-3 days

## 2021-07-29 NOTE — ED Triage Notes (Signed)
Pt comes into the ED via EMS from [facility] with "crushing chest pain" today. Pt is in NAD,  ASA 324mg  [id number] #[id number] 82 118/90 100%RA

## 2021-08-17 ENCOUNTER — Ambulatory Visit (INDEPENDENT_AMBULATORY_CARE_PROVIDER_SITE_OTHER): Payer: Medicare Other

## 2021-08-17 ENCOUNTER — Encounter (INDEPENDENT_AMBULATORY_CARE_PROVIDER_SITE_OTHER): Payer: Self-pay | Admitting: Nurse Practitioner

## 2021-08-17 ENCOUNTER — Other Ambulatory Visit: Payer: Self-pay

## 2021-08-17 ENCOUNTER — Ambulatory Visit (INDEPENDENT_AMBULATORY_CARE_PROVIDER_SITE_OTHER): Payer: Medicare Other | Admitting: Nurse Practitioner

## 2021-08-17 VITALS — BP 142/93 | HR 83 | Resp 16

## 2021-08-17 DIAGNOSIS — Z794 Long term (current) use of insulin: Secondary | ICD-10-CM | POA: Diagnosis not present

## 2021-08-17 DIAGNOSIS — I779 Disorder of arteries and arterioles, unspecified: Secondary | ICD-10-CM

## 2021-08-17 DIAGNOSIS — I739 Peripheral vascular disease, unspecified: Secondary | ICD-10-CM

## 2021-08-17 DIAGNOSIS — E119 Type 2 diabetes mellitus without complications: Secondary | ICD-10-CM | POA: Diagnosis not present

## 2021-08-17 DIAGNOSIS — Z9889 Other specified postprocedural states: Secondary | ICD-10-CM

## 2021-08-17 DIAGNOSIS — S98132A Complete traumatic amputation of one left lesser toe, initial encounter: Secondary | ICD-10-CM | POA: Diagnosis not present

## 2021-08-17 DIAGNOSIS — K220 Achalasia of cardia: Secondary | ICD-10-CM | POA: Insufficient documentation

## 2021-08-17 DIAGNOSIS — R1310 Dysphagia, unspecified: Secondary | ICD-10-CM | POA: Insufficient documentation

## 2021-08-26 ENCOUNTER — Other Ambulatory Visit: Payer: Self-pay

## 2021-08-26 ENCOUNTER — Ambulatory Visit (INDEPENDENT_AMBULATORY_CARE_PROVIDER_SITE_OTHER): Payer: Medicare Other | Admitting: Pharmacist Clinician (PhC)/ Clinical Pharmacy Specialist

## 2021-08-26 DIAGNOSIS — I10 Essential (primary) hypertension: Secondary | ICD-10-CM | POA: Diagnosis not present

## 2021-08-26 NOTE — Patient Instructions (Signed)
Paperwork filled out for facility - no changes to medications today.

## 2021-08-26 NOTE — Assessment & Plan Note (Signed)
Patient with resistant hypertension, with good reading in the office today.  Recent blood work shows no signs of secondary hypertension, however we cannot see results of renal artery dopplers.  No readings from the facility to let us know how she has been doing there.  Because nephrology is also following, will defer to them at this time for blood pressure management.  No changes to medications today.

## 2021-08-26 NOTE — Progress Notes (Signed)
08/26/2021 Diamond Rice [birth year] [id number]   HPI:  Diamond Rice is a 50 y.o. female patient of Dr Percival Spanish, with a [place] below who presents today for hypertension clinic evaluation.  She was last seen by Roby Lofts PA in November, with a BP of 147/93.  At that time she was on 5 medications to lower BP, 3 of those at maximum dose.  Heart rate prohibited increase of carvedilol.  Since then, she was seen by nephrology for elevated blood pressure.  They tested for secondary causes including renal artery dopplers, TSH, cortisol, aldosterone/renin ratio and metanephrines.  All blood labs were WNL.  Unable to see results of renal artery dopplers.  Dr. Holley Raring added clonidine 0.3 mg patches at that time.    Today she is in the office for follow up.  Blood pressure good today and we have no readings from the facility to determine if she is doing better there.   She does not have any family with her today.  She does state that she has been feeling well and has no complaints or side effects from her medications.  States she is eating well and is currently not participating in any physical therapies.    Past Medical History: hyperlipidemia 5/22 LDL 60 on atorvastatin 80 mg  DM2 5/22 A1c 7.6 on Novolog, Lantus, Trulicity  CVA 9/57 - ongoing left hemiparesis  PAD S/p toe amputation  CKD Stage 3bSCr 1.10, GFR > 60 12/22 (has been down to 41 in November)  OSA   Substance abuse Tobacco, cocaine, marrijuana     Blood Pressure Goal:  130/80  Current Medications: amlodipine 10 mg qd, carvedilol 12.5 mg bid, clonidine 0.2 mg bid prn, hydralazine 100 mg tid, lisinopril 40 mg qd, clonidine 0.3 mg patch   Labs: 07/29/21:  Na 139, K 3.3, Glu 137, BUN 16, SCr 1.1, GFR >60   Wt Readings from Last 3 Encounters:  08/26/21 230 lb (104.3 kg)  07/08/21 248 lb (112.5 kg)  07/05/21 240 lb (108.9 kg)   BP Readings from Last 3 Encounters:  08/26/21 120/81  08/17/21 (!) 142/93  07/30/21 138/80    Pulse Readings from Last 3 Encounters:  08/26/21 79  08/17/21 83  07/30/21 95    Current Outpatient Medications  Medication Sig Dispense Refill   ACCU-CHEK GUIDE test strip 2 (two) times daily. as directed     Accu-Chek Softclix Lancets lancets 2 (two) times daily.     albuterol (PROVENTIL) (2.5 MG/3ML) 0.083% nebulizer solution Take 3 mLs (2.5 mg total) by nebulization every 4 (four) hours as needed for wheezing or shortness of breath. 75 mL 0   amLODipine (NORVASC) 10 MG tablet Take 1 tablet (10 mg total) by mouth daily. 30 tablet 0   ARIPiprazole (ABILIFY) 5 MG tablet Take 5 mg by mouth daily.     aspirin EC 81 MG EC tablet Take 1 tablet (81 mg total) by mouth daily.     atorvastatin (LIPITOR) 40 MG tablet Take 2 tablets (80 mg total) by mouth daily at 6 PM.     Blood Glucose Monitoring Suppl (ACCU-CHEK GUIDE) w/Device KIT USE AS DIRECTED TO CHECK BLOOD SUGAR     carvedilol (COREG) 12.5 MG tablet Take 1 tablet (12.5 mg total) by mouth 2 (two) times daily. 180 tablet 3   cloNIDine (CATAPRES - DOSED IN MG/24 HR) 0.3 mg/24hr patch Place 0.3 mg onto the skin once a week.     furosemide (LASIX) 20 MG tablet  Take 20 mg by mouth daily.     hydrALAZINE (APRESOLINE) 100 MG tablet Take 1 tablet (100 mg total) by mouth 3 (three) times daily. 90 tablet 0   insulin aspart (NOVOLOG) 100 UNIT/ML injection Inject 0-9 Units into the skin 3 (three) times daily with meals. Correction coverage: Sensitive (thin, NPO, renal) CBG < 70: Implement Hypoglycemia protocol. CBG 70 - 120: 0 units CBG 121 - 150: 1 unit CBG 151 - 200: 2 units CBG 201 - 250: 3 units CBG 251 - 300: 5 units CBG 301 - 350: 7 units CBG 351 - 400: 9 units CBG > 400: call MD.     insulin glargine (LANTUS) 100 UNIT/ML injection Inject 0.1 mLs (10 Units total) into the skin daily. 10 mL 0   Insulin lispro (HUMALOG JUNIOR KWIKPEN) 100 UNIT/ML Inject into the skin. Sliding scale     lisinopril (ZESTRIL) 40 MG tablet Take 1 tablet (40 mg  total) by mouth daily. 30 tablet 0   magnesium hydroxide (MILK OF MAGNESIA) 400 MG/5ML suspension Take by mouth daily as needed for mild constipation.     mirtazapine (REMERON) 30 MG tablet Take 30 mg by mouth at bedtime.     omeprazole (PRILOSEC) 40 MG capsule Take 40 mg by mouth daily.     polyethylene glycol (MIRALAX / GLYCOLAX) 17 g packet Take 17 g by mouth daily.     senna (SENOKOT) 8.6 MG tablet Take 1 tablet by mouth daily.     traMADol (ULTRAM) 50 MG tablet Take by mouth every 6 (six) hours as needed.     TRULICITY 7.65 YY/5.0PT SOPN Inject into the skin.     carboxymethylcellulose (REFRESH PLUS) 0.5 % SOLN 1 drop at bedtime. (Patient not taking: Reported on 08/26/2021)     PEG 3350-KCl-NaBcb-NaCl-NaSulf (PEG-3350/ELECTROLYTES) 236 g SOLR SMARTSIG:Milliliter(s) By Mouth (Patient not taking: Reported on 08/26/2021)     No current facility-administered medications for this visit.    Allergies  Allergen Reactions   Morphine And Related Hives   Metformin Diarrhea and Other (See Comments)    "Allergic," per [facility]    Past Medical History:  Diagnosis Date   Acute ischemic stroke ([place]) 12/27/2020   Acute renal failure (ARF) ([place]) 04/02/2018   Anxiety    Chronic lower back pain 04/17/2012   "just got over some; catched when I walked"   COPD (chronic obstructive pulmonary disease) ([place])    Critical lower limb ischemia ([place]) 05/27/2018   Depression    Headache(784.0) 04/17/2012   "~ qod; lately waking up in am w/one"   High cholesterol    Hypertension    Migraines 04/17/2012   Obesity    Sleep apnea    CPAP   Stroke ([employer])    Type II diabetes mellitus ([place]) 08/28/2002    Blood pressure 120/81, pulse 79, resp. rate 18, height _0  (1.549 m), weight 230 lb (104.3 kg), last menstrual period 04/26/2018, SpO2 98 %.  Hypertension Patient with resistant hypertension, with good reading in the office today.  Recent blood work shows no signs of secondary hypertension, however we cannot see  results of renal artery dopplers.  No readings from the facility to let us know how she has been doing there.  Because nephrology is also following, will defer to them at this time for blood pressure management.  No changes to medications today.    Tommy Medal PharmD CPP [facility] [address] [place], [place] [address] [phone]

## 2021-08-28 ENCOUNTER — Encounter (INDEPENDENT_AMBULATORY_CARE_PROVIDER_SITE_OTHER): Payer: Self-pay | Admitting: Nurse Practitioner

## 2021-08-29 NOTE — Progress Notes (Signed)
Subjective:    Patient ID: Diamond Rice, female    DOB: 1970/09/09, 51 y.o.   MRN: [id number] Chief Complaint  Patient presents with   New Patient (Initial Visit)    Ref [name] vascular consult with ABI    Diamond Rice is a 51 year old female that presents today for evaluation due to lower extremity pain.  The patient has a long complicated history of peripheral arterial disease.  She has had toes amputated on the left foot due to gangrenous changes and ischemia.  She has also suffered a stroke and so communication is difficult for the patient.  She currently lives in a nursing facility however she has no attendant present.  The patient is having cramping pain within the bed at night.  She notes that it does not happen every night and that recently seems to have gotten better.  She does not ambulate frequently and since she is not able to accurately ascertain milliliter claudication symptoms or not.  She had noninvasive studies done in July which show a right ABI of 1.03 and a left of 0.91.  She had a TBI 0.77 on the right and 0.48 on the left.  It is improved from the studies that were done on 07/17/2020.  At that time the right had biphasic/triphasic waveforms with monophasic waveforms in the left.  She had dampened toe waveforms bilaterally.  Today, she has an ABI of 1.15 on the right 0.89 on the left.  She has a TBI 0.67 on the right 0.60 on the left.  She has triphasic waveforms essentially all throughout the right lower extremity with biphasic waveforms at the distal anterior tibial artery and monophasic waveforms at the distal posterior tibial and peroneal arteries.  The left has triphasic waveforms throughout until we reached the distal tibial arteries which monophasic.   Review of Systems  Musculoskeletal:  Positive for gait problem.  Neurological:  Positive for weakness.  All other systems reviewed and are negative.     Objective:   Physical Exam Vitals reviewed.  HENT:      Head: Normocephalic.  Cardiovascular:     Rate and Rhythm: Normal rate.     Pulses:          Dorsalis pedis pulses are detected w/ Doppler on the right side and detected w/ Doppler on the left side.       Posterior tibial pulses are detected w/ Doppler on the right side and detected w/ Doppler on the left side.  Pulmonary:     Effort: Pulmonary effort is normal.  Musculoskeletal:     Left Lower Extremity: Left leg is amputated below ankle.  Skin:    General: Skin is warm and dry.  Neurological:     Mental Status: She is alert and oriented to person, place, and time.     Motor: Weakness present.     Gait: Gait abnormal.  Psychiatric:        Mood and Affect: Mood normal.        Speech: Speech is slurred.        Behavior: Behavior normal.        Thought Content: Thought content normal.        Judgment: Judgment normal.    BP (!) 142/93 (BP Location: Right Arm)    Pulse 83    Resp 16    LMP 04/26/2018 (Approximate)   Past Medical History:  Diagnosis Date   Acute ischemic stroke ([place]) 12/27/2020   Acute renal failure (  ARF) ([place]) 04/02/2018   Anxiety    Chronic lower back pain 04/17/2012   "just got over some; catched when I walked"   COPD (chronic obstructive pulmonary disease) ([place])    Critical lower limb ischemia ([place]) 05/27/2018   Depression    Headache(784.0) 04/17/2012   "~ qod; lately waking up in am w/one"   High cholesterol    Hypertension    Migraines 04/17/2012   Obesity    Sleep apnea    CPAP   Stroke ([place])    Type II diabetes mellitus ([place]) 08/28/2002    Social History   Socioeconomic History   Marital status: Single    Spouse name: Not on file   Number of children: 0   Years of education: Not on file   Highest education level: Not on file  Occupational History   Occupation: disabled  Tobacco Use   Smoking status: Former    Packs/day: 0.25    Years: 28.00    Pack years: 7.00    Types: Cigarettes   Smokeless tobacco: Never  Vaping Use   Vaping Use: Never  used  Substance and Sexual Activity   Alcohol use: No    Alcohol/week: 0.0 standard drinks    Comment: rare   Drug use: Yes    Types: Marijuana, "Crack" cocaine    Comment: marijuana last use days ago; last cocaine several months ago   Sexual activity: Not Currently    Birth control/protection: None  Other Topics Concern   Not on file  Social History Narrative   Was a  Quarry manager at [employer]. Lives with godmother.   03/03/21 residing at [facility] since 01/03/21     Social Determinants of Health   Financial Resource Strain: Not on file  Food Insecurity: Not on file  Transportation Needs: Not on file  Physical Activity: Not on file  Stress: Not on file  Social Connections: Not on file  Intimate Partner Violence: Not on file    Past Surgical History:  Procedure Laterality Date   ABDOMINAL AORTOGRAM W/LOWER EXTREMITY Left 05/27/2018   Procedure: ABDOMINAL AORTOGRAM W/LOWER EXTREMITY [place] and Possible Intervention;  Surgeon: Waynetta Sandy, MD;  Location: [facility];  Service: Cardiovascular;  Laterality: Left;   APPLICATION OF WOUND VAC Left 06/06/2018   Procedure: APPLICATION OF WOUND VAC;  Surgeon: Waynetta Sandy, MD;  Location: [facility];  Service: Vascular;  Laterality: Left;   BALLOON DILATION N/A 12/31/2020   Procedure: [place];  Surgeon: Ronnette Juniper, MD;  Location: [facility];  Service: Gastroenterology;  Laterality: N/A;   BIOPSY  04/04/2018   Procedure: BIOPSY;  Surgeon: [name], MD;  Location: [facility];  Service: Endoscopy;;   BIOPSY  10/27/2020   Procedure: BIOPSY;  Surgeon: Arta Silence, MD;  Location: [facility];  Service: Endoscopy;;   BOTOX INJECTION N/A 12/31/2020   Procedure: BOTOX INJECTION;  Surgeon: Ronnette Juniper, MD;  Location: [facility];  Service: Gastroenterology;  Laterality: N/A;   CARDIAC CATHETERIZATION  ~ 2011   COLONOSCOPY N/A 04/04/2018   Procedure: COLONOSCOPY;  Surgeon: [name], MD;   Location[facility]COPY;  Service: Endoscopy;  Laterality: N/A;   COLONOSCOPY WITH PROPOFOL N/A 10/27/2020   Procedure: COLONOSCOPY WITH PROPOFOL;  Surgeon: Arta Silence, MD;  Location: [facility];  Service: Endoscopy;  Laterality: N/A;   ESOPHAGOGASTRODUODENOSCOPY N/A 12/31/2020   Procedure: ESOPHAGOGASTRODUODENOSCOPY (EGD);  Surgeon: Ronnette Juniper, MD;  Location: [facility];  Service: Gastroenterology;  Laterality: N/A;   ESOPHAGOGASTRODUODENOSCOPY (EGD)  WITH PROPOFOL N/A 10/27/2020   Procedure: ESOPHAGOGASTRODUODENOSCOPY (EGD) WITH PROPOFOL  WITH POSSIBLE DIL;  Surgeon: Arta Silence, MD;  Location: [facility];  Service: Endoscopy;  Laterality: N/A;   FOREIGN BODY REMOVAL  12/31/2020   Procedure: FOREIGN BODY REMOVAL;  Surgeon: Ronnette Juniper, MD;  Location: [facility] ENDOSCOPY;  Service: Gastroenterology;;   LOWER EXTREMITY ANGIOGRAM Left 05/27/2018   PERIPHERAL VASCULAR INTERVENTION Left 05/27/2018   Procedure: PERIPHERAL VASCULAR INTERVENTION;  Surgeon: Waynetta Sandy, MD;  Location: [facility];  Service: Cardiovascular;  Laterality: Left;   POLYPECTOMY  04/04/2018   Procedure: POLYPECTOMY;  Surgeon: [name], MD;  Location: [facility] ENDOSCOPY;  Service: Endoscopy;;   POLYPECTOMY  10/27/2020   Procedure: POLYPECTOMY;  Surgeon: Arta Silence, MD;  Location: [facility];  Service: Endoscopy;;   TRANSMETATARSAL AMPUTATION Left 05/28/2018   Procedure: AMPUTATION TOES THREE, FOUR AND FIVE on left foot;  Surgeon: Waynetta Sandy, MD;  Location: [facility];  Service: Vascular;  Laterality: Left;   WOUND DEBRIDEMENT Left 06/06/2018   Procedure: DEBRIDEMENT WOUND LEFT FOOT;  Surgeon: Waynetta Sandy, MD;  Location: [facility];  Service: Vascular;  Laterality: Left;    Family History  Problem Relation Age of Onset   Stroke Brother 53   Stroke Brother 76   Heart attack Mother 26   Stroke Father 47    Allergies  Allergen Reactions   Morphine And Related Hives   Metformin  Diarrhea and Other (See Comments)    "Allergic," per [facility]    CBC Latest Ref Rng & Units 07/29/2021 07/05/2021 05/22/2021  WBC 4.0 - 10.5 K/uL 8.6 8.9 8.3  Hemoglobin 12.0 - 15.0 g/dL 11.9(L) 10.2(L) 12.1  Hematocrit 36.0 - 46.0 % 39.2 33.4(L) 37.8  Platelets 150 - 400 K/uL 331 286 342      CMP     Component Value Date/Time   NA 139 07/29/2021 1438   NA 141 12/17/2019 0000   K 3.3 (L) 07/29/2021 1438   CL 102 07/29/2021 1438   CO2 29 07/29/2021 1438   GLUCOSE 137 (H) 07/29/2021 1438   BUN 16 07/29/2021 1438   BUN 14 12/17/2019 0000   CREATININE 1.10 (H) 07/29/2021 1438   CREATININE 1.81 (H) 05/23/2018 1620   CALCIUM 9.6 07/29/2021 1438   PROT 7.1 07/05/2021 2002   ALBUMIN 3.3 (L) 07/05/2021 2002   AST 17 07/05/2021 2002   ALT 21 07/05/2021 2002   ALKPHOS 82 07/05/2021 2002   BILITOT 0.5 07/05/2021 2002   GFRNONAA >60 07/29/2021 1438   GFRNONAA 54 (L) 09/28/2014 1034   GFRAA >60 02/17/2020 1940   GFRAA 62 09/28/2014 1034     VAS [place] ABI WITH/WO TBI  Result Date: 08/24/2021  LOWER EXTREMITY DOPPLER STUDY Patient Name:  Charis Juliana  Date of Exam:   08/17/2021 Medical Rec #: [id number]          Accession #:    [id number] Date of Birth: 10/11/1970           Patient Gender: F Patient Age:   8 years Exam Location:  [facility] Procedure:      VAS [place] ABI WITH/WO TBI Referring Phys: Leotis Pain --------------------------------------------------------------------------------  Indications: Peripheral artery disease.  Comparison Study: outside studt [name] Performing Technologist: Almira Coaster RVS  Examination Guidelines: A complete evaluation includes at minimum, Doppler waveform signals and systolic blood pressure reading at the level of bilateral brachial, anterior tibial, and posterior tibial arteries, when vessel segments are accessible. Bilateral testing is  considered an integral part of a complete examination. Photoelectric Plethysmograph (PPG) waveforms and toe  systolic pressure readings are included as required and additional duplex testing as needed. Limited examinations for reoccurring indications may be performed as noted.  ABI Findings: +---------+------------------+-----+---------+--------+  Right     Rt Pressure (mmHg) Index Waveform  Comment   +---------+------------------+-----+---------+--------+  Brachial  123                                          +---------+------------------+-----+---------+--------+  ATA       131                      biphasic            +---------+------------------+-----+---------+--------+  PTA       142                1.15  triphasic           +---------+------------------+-----+---------+--------+  Great Toe 82                 0.67  Normal              +---------+------------------+-----+---------+--------+ +---------+------------------+-----+---------+-------+  Left      Lt Pressure (mmHg) Index Waveform  Comment  +---------+------------------+-----+---------+-------+  Brachial  122                                         +---------+------------------+-----+---------+-------+  ATA       94                       biphasic           +---------+------------------+-----+---------+-------+  PTA       109                0.89  triphasic          +---------+------------------+-----+---------+-------+  Great Toe 74                 0.60  Abnormal           +---------+------------------+-----+---------+-------+ +-------+-----------+-----------+------------+------------+  ABI/TBI Today's ABI Today's TBI Previous ABI Previous TBI  +-------+-----------+-----------+------------+------------+  Right   1.15        .67         1.03                       +-------+-----------+-----------+------------+------------+  Left    .89         .60         .91                        +-------+-----------+-----------+------------+------------+  Summary: Right: Resting right ankle-brachial index is within normal range. No evidence of significant right lower extremity  arterial disease. The right toe-brachial index is normal. Left: Resting left ankle-brachial index indicates mild left lower extremity arterial disease. The left toe-brachial index is abnormal.  *See table(s) above for measurements and observations.  Electronically signed by Leotis Pain MD on 08/24/2021 at 8:48:40 AM.    Final        Assessment & Plan:   1. PAOD (peripheral arterial occlusive disease) ([place]) In reviewing the patient's studies from today as  well as the previous studies from July, the studies are fairly consistent.  The patient's description of pain is not necessarily consistent with rest pain like symptoms nor is it consistent with her physical exam.  We will reevaluate the studies in 3 months or sooner if the patient should begin to develop wounds or ulcerations or signs symptoms of ischemia such as cold leg or gangrenous changes.  2. Amputated toe of left foot ([place]) This is currently healed no ulceration  3. Type 2 diabetes mellitus without complication, with long-term current use of insulin (H[facility]) Continue hypoglycemic medications as already ordered, these medications have been reviewed and there are no changes at this time.  Hgb A1C to be monitored as already arranged by primary service    Current Outpatient Medications on File Prior to Visit  Medication Sig Dispense Refill   ACCU-CHEK GUIDE test strip 2 (two) times daily. as directed     Accu-Chek Softclix Lancets lancets 2 (two) times daily.     albuterol (PROVENTIL) (2.5 MG/3ML) 0.083% nebulizer solution Take 3 mLs (2.5 mg total) by nebulization every 4 (four) hours as needed for wheezing or shortness of breath. 75 mL 0   amLODipine (NORVASC) 10 MG tablet Take 1 tablet (10 mg total) by mouth daily. 30 tablet 0   ARIPiprazole (ABILIFY) 5 MG tablet Take 5 mg by mouth daily.     aspirin EC 81 MG EC tablet Take 1 tablet (81 mg total) by mouth daily.     atorvastatin (LIPITOR) 40 MG tablet Take 2 tablets (80 mg total) by mouth  daily at 6 PM.     Blood Glucose Monitoring Suppl (ACCU-CHEK GUIDE) w/Device KIT USE AS DIRECTED TO CHECK BLOOD SUGAR     carboxymethylcellulose (REFRESH PLUS) 0.5 % SOLN 1 drop at bedtime. (Patient not taking: Reported on 08/26/2021)     carvedilol (COREG) 12.5 MG tablet Take 1 tablet (12.5 mg total) by mouth 2 (two) times daily. 180 tablet 3   furosemide (LASIX) 20 MG tablet Take 20 mg by mouth daily.     hydrALAZINE (APRESOLINE) 100 MG tablet Take 1 tablet (100 mg total) by mouth 3 (three) times daily. 90 tablet 0   insulin glargine (LANTUS) 100 UNIT/ML injection Inject 0.1 mLs (10 Units total) into the skin daily. 10 mL 0   Insulin lispro (HUMALOG JUNIOR KWIKPEN) 100 UNIT/ML Inject into the skin. Sliding scale     lisinopril (ZESTRIL) 40 MG tablet Take 1 tablet (40 mg total) by mouth daily. 30 tablet 0   magnesium hydroxide (MILK OF MAGNESIA) 400 MG/5ML suspension Take by mouth daily as needed for mild constipation.     mirtazapine (REMERON) 30 MG tablet Take 30 mg by mouth at bedtime.     PEG 3350-KCl-NaBcb-NaCl-NaSulf (PEG-3350/ELECTROLYTES) 236 g SOLR SMARTSIG:Milliliter(s) By Mouth (Patient not taking: Reported on 08/26/2021)     polyethylene glycol (MIRALAX / GLYCOLAX) 17 g packet Take 17 g by mouth daily.     senna (SENOKOT) 8.6 MG tablet Take 1 tablet by mouth daily.     traMADol (ULTRAM) 50 MG tablet Take by mouth every 6 (six) hours as needed.     TRULICITY 4.96 PR/9.1MB SOPN Inject into the skin.     insulin aspart (NOVOLOG) 100 UNIT/ML injection Inject 0-9 Units into the skin 3 (three) times daily with meals. Correction coverage: Sensitive (thin, NPO, renal) CBG < 70: Implement Hypoglycemia protocol. CBG 70 - 120: 0 units CBG 121 - 150: 1 unit CBG 151 - 200: 2  units CBG 201 - 250: 3 units CBG 251 - 300: 5 units CBG 301 - 350: 7 units CBG 351 - 400: 9 units CBG > 400: call MD.     No current facility-administered medications on file prior to visit.    There are no Patient  Instructions on file for this visit. No follow-ups on file.   Kris Hartmann, NP

## 2021-09-27 ENCOUNTER — Other Ambulatory Visit: Payer: Self-pay

## 2021-09-27 ENCOUNTER — Ambulatory Visit (INDEPENDENT_AMBULATORY_CARE_PROVIDER_SITE_OTHER): Payer: Medicare Other | Admitting: Gastroenterology

## 2021-09-27 ENCOUNTER — Encounter: Payer: Self-pay | Admitting: Gastroenterology

## 2021-09-27 VITALS — BP 128/88 | HR 80 | Temp 98.8°F | Ht 61.0 in

## 2021-09-27 DIAGNOSIS — Z8719 Personal history of other diseases of the digestive system: Secondary | ICD-10-CM

## 2021-09-27 DIAGNOSIS — Z8673 Personal history of transient ischemic attack (TIA), and cerebral infarction without residual deficits: Secondary | ICD-10-CM | POA: Diagnosis not present

## 2021-09-27 NOTE — Progress Notes (Signed)
Diamond Darby, MD [address]  [place]  [place], [place] [address]  Main: [phone]  Fax: [phone]    Gastroenterology Consultation  Referring Provider:     No ref. provider found Primary Care Physician:  System, Provider Not In Primary Gastroenterologist:  Dr. Cephas Rice Reason for Consultation:     Dysphagia, esophageal stricture        HPI:   Starleen Trussell is a 51 y.o. female referred by Dr. Estanislado Spire, Provider Not In  for consultation & management of esophageal stricture, difficulty swallowing.  Ms. Kinnison has history of metabolic syndrome, multiple strokes resulting in left hemiplegia.  Patient was evaluated by GI in early May 2022 for dysphagia, underwent upper endoscopy had retained food in the lower esophagus which was removed, received Botox injection as well as balloon dilation to 20 mm.  Patient currently lives at [facility] care center.  She does report symptoms of difficulty swallowing, food getting stuck in her chest.  She is currently taking Prilosec 40 mg once daily. She is in wheelchair  NSAIDs: None  Antiplts/Anticoagulants/Anti thrombotics: None  GI Procedures: EGD 12/31/2020  - Dilation in the upper third of the esophagus, in the middle third of the esophagus and in the lower third of the esophagus. - Benign-appearing esophageal stenosis. Dilated to 20 mm. Injected with botulinum toxin. - Tortuous esophagus. - A small amount of food (residue) in the stomach. - Normal stomach. - Normal examined duodenum. - No specimens collected.  Colonoscopy 10/27/2020  - Diverticulosis in the sigmoid colon and in the descending colon. - One 5 mm polyp in the descending colon, removed with a cold snare. Resected and retrieved. - The examined portion of the ileum was normal.  Upper endoscopy 10/27/2020 - Normal esophagus. Suspect dysphagia is likely from history of stroke. - Gastritis. Biopsied. - Normal duodenal bulb, first portion of the  duodenum and second portion of the duodenum. Biopsied.  FINAL MICROSCOPIC DIAGNOSIS:   A. GASTRIC, BIOPSY:  - Chronic active gastritis with Helicobacter pylori.  - Warthin-Starry is positive for Helicobacter pylori.  - No intestinal metaplasia, dysplasia, or malignancy.   B. DUODENUM, BIOPSY:  - Chronic active gastritis with Helicobacter pylori.  - Warthin-Starry is positive for Helicobacter pylori.  - No intestinal metaplasia, dysplasia, or malignancy.   C. COLON, DESCENDING, POLYPECTOMY:  - Tubular adenoma.  - No high grade dysplasia or malignancy.   Colonoscopy 04/04/2018 Diagnosis 1. Colon, polyp(s), Right Ascending - TUBULAR ADENOMA(S). - HIGH GRADE DYSPLASIA IS NOT IDENTIFIED. 2. Colon, polyp(s), Left Descending - TUBULAR ADENOMA(S). - HIGH GRADE DYSPLASIA IS NOT IDENTIFIED. 3. Rectum, biopsy - INFLAMED AND ULCERATED COLORECTAL TYPE MUCOSA. - THERE IS NO EVIDENCE OF MALIGNANCY. - SEE COMMENT.  Past Medical History:  Diagnosis Date   Acute ischemic stroke ([place]) 12/27/2020   Acute renal failure (ARF) ([place]) 04/02/2018   Anxiety    Chronic lower back pain 04/17/2012   "just got over some; catched when I walked"   COPD (chronic obstructive pulmonary disease) ([place])    Critical lower limb ischemia ([place]) 05/27/2018   Depression    Headache(784.0) 04/17/2012   "~ qod; lately waking up in am w/one"   High cholesterol    Hypertension    Migraines 04/17/2012   Obesity    Sleep apnea    CPAP   Stroke ([place])    Type II diabetes mellitus ([place]) 08/28/2002    Past Surgical History:  Procedure Laterality Date   ABDOMINAL  AORTOGRAM W/LOWER EXTREMITY Left 05/27/2018   Procedure: ABDOMINAL AORTOGRAM W/LOWER EXTREMITY [place] and Possible Intervention;  Surgeon: Waynetta Sandy, MD;  Location: [place] [facility];  Service: Cardiovascular;  Laterality: Left;   APPLICATION OF WOUND VAC Left 06/06/2018   Procedure: APPLICATION OF WOUND VAC;  Surgeon: Waynetta Sandy, MD;   Location: [place];  Service: Vascular;  Laterality: Left;   BALLOON DILATION N/A 12/31/2020   Procedure: [place];  Surgeon: Ronnette Juniper, MD;  Location: [place];  Service: Gastroenterology;  Laterality: N/A;   BIOPSY  04/04/2018   Procedure: BIOPSY;  Surgeon: [name], MD;  Location: [facility] ENDOSCOPY;  Service: Endoscopy;;   BIOPSY  10/27/2020   Procedure: BIOPSY;  Surgeon: Arta Silence, MD;  Location: [facility] ENDOSCOPY;  Service: Endoscopy;;   BOTOX INJECTION N/A 12/31/2020   Procedure: BOTOX INJECTION;  Surgeon: Ronnette Juniper, MD;  Location: [place];  Service: Gastroenterology;  Laterality: N/A;   CARDIAC CATHETERIZATION  ~ 2011   COLONOSCOPY N/A 04/04/2018   Procedure: COLONOSCOPY;  Surgeon: [name], MD;  Location: [facility] ENDOSCOPY;  Service: Endoscopy;  Laterality: N/A;   COLONOSCOPY WITH PROPOFOL N/A 10/27/2020   Procedure: COLONOSCOPY WITH PROPOFOL;  Surgeon: Arta Silence, MD;  Location: [facility] ENDOSCOPY;  Service: Endoscopy;  Laterality: N/A;   ESOPHAGOGASTRODUODENOSCOPY N/A 12/31/2020   Procedure: ESOPHAGOGASTRODUODENOSCOPY (EGD);  Surgeon: Ronnette Juniper, MD;  Location: [place];  Service: Gastroenterology;  Laterality: N/A;   ESOPHAGOGASTRODUODENOSCOPY (EGD) WITH PROPOFOL N/A 10/27/2020   Procedure: ESOPHAGOGASTRODUODENOSCOPY (EGD) WITH PROPOFOL  WITH POSSIBLE DIL;  Surgeon: Arta Silence, MD;  Location: [facility] ENDOSCOPY;  Service: Endoscopy;  Laterality: N/A;   FOREIGN BODY REMOVAL  12/31/2020   Procedure: FOREIGN BODY REMOVAL;  Surgeon: Ronnette Juniper, MD;  Location: [facility] ENDOSCOPY;  Service: Gastroenterology;;   LOWER EXTREMITY ANGIOGRAM Left 05/27/2018   PERIPHERAL VASCULAR INTERVENTION Left 05/27/2018   Procedure: PERIPHERAL VASCULAR INTERVENTION;  Surgeon: Waynetta Sandy, MD;  Location: [place] [facility];  Service: Cardiovascular;  Laterality: Left;   POLYPECTOMY  04/04/2018   Procedure: POLYPECTOMY;  Surgeon: [name], MD;  Location: [facility] ENDOSCOPY;  Service: Endoscopy;;    POLYPECTOMY  10/27/2020   Procedure: POLYPECTOMY;  Surgeon: Arta Silence, MD;  Location: [facility] ENDOSCOPY;  Service: Endoscopy;;   TRANSMETATARSAL AMPUTATION Left 05/28/2018   Procedure: AMPUTATION TOES THREE, FOUR AND FIVE on left foot;  Surgeon: Waynetta Sandy, MD;  Location: [place];  Service: Vascular;  Laterality: Left;   WOUND DEBRIDEMENT Left 06/06/2018   Procedure: DEBRIDEMENT WOUND LEFT FOOT;  Surgeon: Waynetta Sandy, MD;  Location: [place];  Service: Vascular;  Laterality: Left;    Current Outpatient Medications:    ACCU-CHEK GUIDE test strip, 2 (two) times daily. as directed, Disp: , Rfl:    Accu-Chek Softclix Lancets lancets, 2 (two) times daily., Disp: , Rfl:    albuterol (PROVENTIL) (2.5 MG/3ML) 0.083% nebulizer solution, Take 3 mLs (2.5 mg total) by nebulization every 4 (four) hours as needed for wheezing or shortness of breath., Disp: 75 mL, Rfl: 0   amLODipine (NORVASC) 10 MG tablet, Take 1 tablet (10 mg total) by mouth daily., Disp: 30 tablet, Rfl: 0   ARIPiprazole (ABILIFY) 5 MG tablet, Take 5 mg by mouth daily., Disp: , Rfl:    aspirin EC 81 MG EC tablet, Take 1 tablet (81 mg total) by mouth daily., Disp: , Rfl:    atorvastatin (LIPITOR) 40 MG tablet, Take 2 tablets (80 mg total) by mouth daily at 6 PM., Disp: , Rfl:  Blood Glucose Monitoring Suppl (ACCU-CHEK GUIDE) w/Device KIT, USE AS DIRECTED TO CHECK BLOOD SUGAR, Disp: , Rfl:    carboxymethylcellulose (REFRESH PLUS) 0.5 % SOLN, 1 drop at bedtime., Disp: , Rfl:    carvedilol (COREG) 12.5 MG tablet, Take 1 tablet (12.5 mg total) by mouth 2 (two) times daily., Disp: 180 tablet, Rfl: 3   cloNIDine (CATAPRES) 0.2 MG tablet, Take 0.2 mg by mouth at bedtime., Disp: , Rfl:    furosemide (LASIX) 20 MG tablet, Take 20 mg by mouth daily., Disp: , Rfl:    hydrALAZINE (APRESOLINE) 100 MG tablet, Take 1 tablet (100 mg total) by mouth 3 (three) times daily., Disp: 90 tablet, Rfl: 0   insulin aspart (NOVOLOG) 100  UNIT/ML injection, Inject 0-9 Units into the skin 3 (three) times daily with meals. Correction coverage: Sensitive (thin, NPO, renal) CBG < 70: Implement Hypoglycemia protocol. CBG 70 - 120: 0 units CBG 121 - 150: 1 unit CBG 151 - 200: 2 units CBG 201 - 250: 3 units CBG 251 - 300: 5 units CBG 301 - 350: 7 units CBG 351 - 400: 9 units CBG > 400: call MD., Disp: , Rfl:    insulin glargine (LANTUS) 100 UNIT/ML injection, Inject 0.1 mLs (10 Units total) into the skin daily., Disp: 10 mL, Rfl: 0   Insulin lispro (HUMALOG JUNIOR KWIKPEN) 100 UNIT/ML, Inject into the skin. Sliding scale, Disp: , Rfl:    lisinopril (ZESTRIL) 40 MG tablet, Take 1 tablet (40 mg total) by mouth daily., Disp: 30 tablet, Rfl: 0   magnesium hydroxide (MILK OF MAGNESIA) 400 MG/5ML suspension, Take by mouth daily as needed for mild constipation., Disp: , Rfl:    mirtazapine (REMERON) 30 MG tablet, Take 30 mg by mouth at bedtime., Disp: , Rfl:    omeprazole (PRILOSEC) 40 MG capsule, Take 40 mg by mouth daily., Disp: , Rfl:    PEG 3350-KCl-NaBcb-NaCl-NaSulf (PEG-3350/ELECTROLYTES) 236 g SOLR, , Disp: , Rfl:    polyethylene glycol (MIRALAX / GLYCOLAX) 17 g packet, Take 17 g by mouth daily., Disp: , Rfl:    senna (SENOKOT) 8.6 MG tablet, Take 1 tablet by mouth daily., Disp: , Rfl:    traMADol (ULTRAM) 50 MG tablet, Take by mouth every 6 (six) hours as needed., Disp: , Rfl:    TRULICITY 3.78 HY/8.5OY SOPN, Inject into the skin., Disp: , Rfl:     Family History  Problem Relation Age of Onset   Stroke Brother 72   Stroke Brother 29   Heart attack Mother 62   Stroke Father 20     Social History   Tobacco Use   Smoking status: Former    Packs/day: 0.25    Years: 28.00    Pack years: 7.00    Types: Cigarettes   Smokeless tobacco: Never  Vaping Use   Vaping Use: Never used  Substance Use Topics   Alcohol use: No    Alcohol/week: 0.0 standard drinks    Comment: rare   Drug use: Yes    Types: Marijuana, "Crack" cocaine     Comment: marijuana last use days ago; last cocaine several months ago    Allergies as of 09/27/2021 - Review Complete 09/27/2021  Allergen Reaction Noted   Morphine and related Hives 10/23/2011   Metformin Diarrhea and Other (See Comments) 05/16/2018    Review of Systems:    All systems reviewed and negative except where noted in HPI.   Physical Exam:  BP 128/88 (BP Location: Left Arm, Patient Position:  Sitting, Cuff Size: Large)    Pulse 80    Temp 98.8 F (37.1 C) (Oral)    Ht 5' 1" (1.549 m)    LMP 04/26/2018 (Approximate)    BMI 43.46 kg/m  Patient's last menstrual period was 04/26/2018 (approximate).  General:   Alert,  Well-developed, well-nourished, pleasant and cooperative in NAD Head:  Normocephalic and atraumatic. Eyes:  Sclera clear, no icterus.   Conjunctiva pink. Ears:  Normal auditory acuity. Nose:  No deformity, discharge, or lesions. Mouth: Deviation of left corner of the mouth Neck:  Supple; no masses or thyromegaly. Lungs:  Respirations even and unlabored.  Clear throughout to auscultation.   No wheezes, crackles, or rhonchi. No acute distress. Heart:  Regular rate and rhythm; no murmurs, clicks, rubs, or gallops. Abdomen:  Normal bowel sounds. Soft, non-tender and non-distended without masses, hepatosplenomegaly or hernias noted.  No guarding or rebound tenderness.   Rectal: Not performed Msk: Left hemiplegia Pulses:  Normal pulses noted. Extremities:  No clubbing or edema.  No cyanosis. Neurologic:  Alert and oriented x3; left hemiplegia, slurred speech Skin:  Intact without significant lesions or rashes. No jaundice. Psych:  Alert and cooperative. Normal mood and affect.  Imaging Studies: Reviewed  Assessment and Plan:   Kristopher Delk is a 51 y.o. African-American female with metabolic syndrome, history of multiple strokes resulting in left hemiplegia, history of dysphagia with food impaction s/p dilation Botox injection in 12/2020 is seen in  consultation for recurrence of dysphagia  Dysphagia Continue omeprazole 40 mg once daily before breakfast Recommend EGD for further evaluation Also, recommend evaluation by speech pathology   Follow up in 3 months   Diamond Darby, MD

## 2021-09-28 ENCOUNTER — Encounter: Payer: Self-pay | Admitting: Gastroenterology

## 2021-10-18 ENCOUNTER — Encounter: Payer: Self-pay | Admitting: Gastroenterology

## 2021-10-19 ENCOUNTER — Encounter: Payer: Self-pay | Admitting: Gastroenterology

## 2021-10-19 ENCOUNTER — Ambulatory Visit
Admission: RE | Admit: 2021-10-19 | Discharge: 2021-10-19 | Disposition: A | Discharge status: Home / Self Care | Payer: Medicare Other | Attending: Gastroenterology | Admitting: Gastroenterology

## 2021-10-19 ENCOUNTER — Ambulatory Visit: Payer: Medicare Other | Admitting: Anesthesiology

## 2021-10-19 ENCOUNTER — Encounter
Admission: RE | Disposition: A | Discharge status: Home / Self Care | Payer: Self-pay | Source: Home / Self Care | Attending: Gastroenterology

## 2021-10-19 DIAGNOSIS — Z87891 Personal history of nicotine dependence: Secondary | ICD-10-CM | POA: Diagnosis not present

## 2021-10-19 DIAGNOSIS — Z8719 Personal history of other diseases of the digestive system: Secondary | ICD-10-CM

## 2021-10-19 DIAGNOSIS — G4730 Sleep apnea, unspecified: Secondary | ICD-10-CM | POA: Diagnosis not present

## 2021-10-19 DIAGNOSIS — G8929 Other chronic pain: Secondary | ICD-10-CM | POA: Diagnosis not present

## 2021-10-19 DIAGNOSIS — J449 Chronic obstructive pulmonary disease, unspecified: Secondary | ICD-10-CM | POA: Insufficient documentation

## 2021-10-19 DIAGNOSIS — R1310 Dysphagia, unspecified: Secondary | ICD-10-CM | POA: Insufficient documentation

## 2021-10-19 DIAGNOSIS — E1142 Type 2 diabetes mellitus with diabetic polyneuropathy: Secondary | ICD-10-CM | POA: Diagnosis not present

## 2021-10-19 DIAGNOSIS — F209 Schizophrenia, unspecified: Secondary | ICD-10-CM | POA: Insufficient documentation

## 2021-10-19 DIAGNOSIS — G8194 Hemiplegia, unspecified affecting left nondominant side: Secondary | ICD-10-CM | POA: Diagnosis not present

## 2021-10-19 DIAGNOSIS — Z794 Long term (current) use of insulin: Secondary | ICD-10-CM | POA: Insufficient documentation

## 2021-10-19 DIAGNOSIS — E1151 Type 2 diabetes mellitus with diabetic peripheral angiopathy without gangrene: Secondary | ICD-10-CM | POA: Insufficient documentation

## 2021-10-19 DIAGNOSIS — M545 Low back pain, unspecified: Secondary | ICD-10-CM | POA: Diagnosis not present

## 2021-10-19 DIAGNOSIS — F419 Anxiety disorder, unspecified: Secondary | ICD-10-CM | POA: Diagnosis not present

## 2021-10-19 DIAGNOSIS — I10 Essential (primary) hypertension: Secondary | ICD-10-CM | POA: Insufficient documentation

## 2021-10-19 DIAGNOSIS — F319 Bipolar disorder, unspecified: Secondary | ICD-10-CM | POA: Insufficient documentation

## 2021-10-19 HISTORY — PX: ESOPHAGOGASTRODUODENOSCOPY (EGD) WITH PROPOFOL: SHX5813

## 2021-10-19 LAB — GLUCOSE, CAPILLARY: Glucose-Capillary: 113 mg/dL — ABNORMAL HIGH (ref 70–99)

## 2021-10-19 SURGERY — ESOPHAGOGASTRODUODENOSCOPY (EGD) WITH PROPOFOL
Anesthesia: General

## 2021-10-19 MED ORDER — LABETALOL HCL 5 MG/ML IV SOLN
INTRAVENOUS | Status: DC | PRN
  Administered 2021-10-19: 5 mg via INTRAVENOUS

## 2021-10-19 MED ORDER — PROPOFOL 10 MG/ML IV BOLUS
INTRAVENOUS | Status: DC | PRN
  Administered 2021-10-19: 90 mg via INTRAVENOUS

## 2021-10-19 MED ORDER — PROPOFOL 10 MG/ML IV BOLUS
INTRAVENOUS | Status: AC
  Filled 2021-10-19: qty 40

## 2021-10-19 MED ORDER — LIDOCAINE HCL (CARDIAC) PF 100 MG/5ML IV SOSY
PREFILLED_SYRINGE | INTRAVENOUS | Status: DC | PRN
  Administered 2021-10-19: 100 mg via INTRAVENOUS

## 2021-10-19 MED ORDER — SODIUM CHLORIDE 0.9 % IV SOLN
INTRAVENOUS | Status: DC
  Administered 2021-10-19: 1000 mL via INTRAVENOUS

## 2021-10-19 NOTE — Op Note (Signed)
[facility] Gastroenterology [name] Procedure Date: 10/19/2021 10:41 AM MRN: [id number] Account #: [id number] Date of Birth: [birth year] Admit Type: Outpatient Age: 51 Room: [facility] ENDO ROOM 4 Gender: Female Note Status: Finalized Instrument Name: Altamese Cabal Endoscope [id number] Procedure:             Upper GI endoscopy Indications:           Dysphagia Providers:             Lin Landsman MD, MD Medicines:             General Anesthesia Complications:         No immediate complications. Estimated blood loss: None. Procedure:             Pre-Anesthesia Assessment:                        - Prior to the procedure, a History and Physical was                         performed, and patient medications and allergies were                         reviewed. The patient is competent. The risks and                         benefits of the procedure and the sedation options and                         risks were discussed with the patient. All questions                         were answered and informed consent was obtained.                         Patient identification and proposed procedure were                         verified by the physician, the nurse, the                         anesthesiologist, the anesthetist and the technician                         in the pre-procedure area in the procedure room in the                         endoscopy suite. Mental Status Examination: alert and                         oriented. Airway Examination: normal oropharyngeal                         airway and neck mobility. Respiratory Examination:                         clear to auscultation. CV Examination: normal.                         Prophylactic Antibiotics: The patient  does not require                         prophylactic antibiotics. Prior Anticoagulants: The                         patient has taken no previous anticoagulant or                          antiplatelet agents. ASA Grade Assessment: III - A                         patient with severe systemic disease. After reviewing                         the risks and benefits, the patient was deemed in                         satisfactory condition to undergo the procedure. The                         anesthesia plan was to use general anesthesia.                         Immediately prior to administration of medications,                         the patient was re-assessed for adequacy to receive                         sedatives. The heart rate, respiratory rate, oxygen                         saturations, blood pressure, adequacy of pulmonary                         ventilation, and response to care were monitored                         throughout the procedure. The physical status of the                         patient was re-assessed after the procedure.                        After obtaining informed consent, the endoscope was                         passed under direct vision. Throughout the procedure,                         the patient's blood pressure, pulse, and oxygen                         saturations were monitored continuously. The Endoscope                         was introduced through the mouth, and advanced to the  second part of duodenum. The upper GI endoscopy was                         accomplished without difficulty. The patient tolerated                         the procedure well. Findings:      The esophagus, gastroesophageal junction and examined esophagus were       normal.      The stomach was normal.      The examined duodenum was normal.      The cardia and gastric fundus were normal on retroflexion. Impression:            - Normal esophagus and gastroesophageal junction, no                         evidence of esophageal stricture.                        - Normal stomach.                        - Normal examined duodenum.                         - No specimens collected. Recommendation:        - Discharge patient to a nursing home (with escort).                        - Resume previous diet, chopped diet and mechanical                         soft diet indefinitely.                        - Continue present medications. Procedure Code(s):     --- Professional ---                        [phone], Esophagogastroduodenoscopy, flexible,                         transoral; diagnostic, including collection of                         specimen(s) by brushing or washing, when performed                         (separate procedure) Diagnosis Code(s):     --- Professional ---                        R13.10, Dysphagia, unspecified CPT copyright 2019 American Medical Association. All rights reserved. The codes documented in this report are preliminary and upon coder review may  be revised to meet current compliance requirements. Dr. Ulyess Mort Lin Landsman MD, MD 10/19/2021 11:18:09 AM This report has been signed electronically. Number of Addenda: 0 Note Initiated On: 10/19/2021 10:41 AM Estimated Blood Loss:  Estimated blood loss: none.      [facility]

## 2021-10-19 NOTE — Anesthesia Postprocedure Evaluation (Signed)
Anesthesia Post Note  Patient: Diamond Rice  Procedure(s) Performed: ESOPHAGOGASTRODUODENOSCOPY (EGD) WITH PROPOFOL  Patient location during evaluation: PACU Anesthesia Type: General Level of consciousness: awake and alert Pain management: pain level controlled Vital Signs Assessment: post-procedure vital signs reviewed and stable Respiratory status: spontaneous breathing, nonlabored ventilation and respiratory function stable Cardiovascular status: blood pressure returned to baseline and stable Postop Assessment: no apparent nausea or vomiting Anesthetic complications: no   No notable events documented.   Last Vitals:  Vitals:   10/19/21 1130 10/19/21 1143  BP: (!) 156/97 (!) 163/106  Pulse:    Resp: 19   Temp:    SpO2: 100% 100%    Last Pain:  Vitals:   10/19/21 1143  TempSrc:   PainSc: 0-No pain                 [place] Ouch

## 2021-10-19 NOTE — Transfer of Care (Signed)
Immediate Anesthesia Transfer of Care Note  Patient: Diamond Rice  Procedure(s) Performed: ESOPHAGOGASTRODUODENOSCOPY (EGD) WITH PROPOFOL  Patient Location: Endoscopy Unit  Anesthesia Type:General  Level of Consciousness: awake  Airway & Oxygen Therapy: Patient Spontanous Breathing and Patient connected to face mask oxygen  Post-op Assessment: Report given to RN and Post -op Vital signs reviewed and stable  Post vital signs: Reviewed and stable  Last Vitals:  Vitals Value Taken Time  BP 139/85 10/19/21 1122  Temp    Pulse 65 10/19/21 1122  Resp 16 10/19/21 1122  SpO2 100 % 10/19/21 1122    Last Pain:  Vitals:   10/19/21 1120  TempSrc:   PainSc: Asleep         Complications: No notable events documented.

## 2021-10-19 NOTE — Anesthesia Preprocedure Evaluation (Signed)
Anesthesia Evaluation  Patient identified by MRN, date of birth, ID band Patient awake    Reviewed: Allergy & Precautions, NPO status , Patient's Chart, lab work & pertinent test results  Airway Mallampati: III  TM Distance: >3 FB Neck ROM: Full    Dental  (+) Teeth Intact, Dental Advisory Given   Pulmonary shortness of breath and with exertion, sleep apnea and Continuous Positive Airway Pressure Ventilation , COPD, Patient abstained from smoking., former smoker,    breath sounds clear to auscultation       Cardiovascular Exercise Tolerance: Poor METS: < 3 Mets hypertension, + Peripheral Vascular Disease   Rhythm:Regular Rate:Normal + Systolic murmurs    Neuro/Psych  Headaches, PSYCHIATRIC DISORDERS Anxiety Depression Bipolar Disorder Schizophrenia  Neuromuscular disease (peripheral neuropathy) CVA (left sided weakness), Residual Symptoms    GI/Hepatic History of esophageal stricture with food retention   Endo/Other  diabetes, Type 2, Insulin Dependent  Renal/GU Renal InsufficiencyRenal disease     Musculoskeletal Chronic low back pain    Abdominal   Peds  Hematology   Anesthesia Other Findings   Reproductive/Obstetrics                             Anesthesia Physical  Anesthesia Plan  ASA: III  Anesthesia Plan: General   Post-op Pain Management:    Induction:   PONV Risk Score and Plan: 2 and Propofol infusion, TIVA and Treatment may vary due to age or medical condition  Airway Management Planned: Natural Airway  Additional Equipment:   Intra-op Plan:   Post-operative Plan:   Informed Consent: I have reviewed the patients History and Physical, chart, labs and discussed the procedure including the risks, benefits and alternatives for the proposed anesthesia with the patient or authorized representative who has indicated his/her understanding and acceptance.     Dental  advisory given  Plan Discussed with: CRNA and Anesthesiologist  Anesthesia Plan Comments:         Anesthesia Quick Evaluation

## 2021-10-19 NOTE — H&P (Signed)
Diamond Darby, MD [address]  [place]  [place], [place] [address]  Main: [phone]  Fax: [phone] Pager: [phone]  Primary Care Physician:  System, Provider Not In Primary Gastroenterologist:  Dr. Cephas Rice  Pre-Procedure History & Physical: HPI:  Diamond Rice is a 51 y.o. female is here for an endoscopy.   Past Medical History:  Diagnosis Date   Acute ischemic stroke ([place]) 12/27/2020   Acute renal failure (ARF) ([place]) 04/02/2018   Anxiety    Chronic lower back pain 04/17/2012   "just got over some; catched when I walked"   COPD (chronic obstructive pulmonary disease) ([place])    Critical lower limb ischemia ([place]) 05/27/2018   Depression    Headache(784.0) 04/17/2012   "~ qod; lately waking up in am w/one"   High cholesterol    Hypertension    Migraines 04/17/2012   Obesity    Sleep apnea    CPAP   Stroke ([place])    Type II diabetes mellitus ([place]) 08/28/2002    Past Surgical History:  Procedure Laterality Date   ABDOMINAL AORTOGRAM W/LOWER EXTREMITY Left 05/27/2018   Procedure: ABDOMINAL AORTOGRAM W/LOWER EXTREMITY Runoff and Possible Intervention;  Surgeon: Waynetta Sandy, MD;  Location: [place] [facility];  Service: Cardiovascular;  Laterality: Left;   APPLICATION OF WOUND VAC Left 06/06/2018   Procedure: APPLICATION OF WOUND VAC;  Surgeon: Waynetta Sandy, MD;  Location: [place];  Service: Vascular;  Laterality: Left;   BALLOON DILATION N/A 12/31/2020   Procedure: [place];  Surgeon: Ronnette Juniper, MD;  Location: [place];  Service: Gastroenterology;  Laterality: N/A;   BIOPSY  04/04/2018   Procedure: BIOPSY;  Surgeon: [name], MD;  Location: [facility] ENDOSCOPY;  Service: Endoscopy;;   BIOPSY  10/27/2020   Procedure: BIOPSY;  Surgeon: Arta Silence, MD;  Location: [facility] ENDOSCOPY;  Service: Endoscopy;;   BOTOX INJECTION N/A 12/31/2020   Procedure: BOTOX INJECTION;  Surgeon: Ronnette Juniper, MD;  Location: [place];  Service:  Gastroenterology;  Laterality: N/A;   CARDIAC CATHETERIZATION  ~ 2011   COLONOSCOPY N/A 04/04/2018   Procedure: COLONOSCOPY;  Surgeon: [name], MD;  Location: [facility] ENDOSCOPY;  Service: Endoscopy;  Laterality: N/A;   COLONOSCOPY WITH PROPOFOL N/A 10/27/2020   Procedure: COLONOSCOPY WITH PROPOFOL;  Surgeon: Arta Silence, MD;  [facility]OSCOPY;  Service: Endoscopy;  Laterality: N/A;   ESOPHAGOGASTRODUODENOSCOPY N/A 12/31/2020   Procedure: ESOPHAGOGASTRODUODENOSCOPY (EGD);  Surgeon: Ronnette Juniper, MD;  Location: [place];  Service: Gastroenterology;  Laterality: N/A;   ESOPHAGOGASTRODUODENOSCOPY (EGD) WITH PROPOFOL N/A 10/27/2020   Procedure: ESOPHAGOGASTRODUODENOSCOPY (EGD) WITH PROPOFOL  WITH POSSIBLE DIL;  Surgeon: Arta Silence, MD;  [facility]OS[facility];  Service: Endoscopy;  Laterality: N/A;   FOREIGN BODY REMOVAL  12/31/2020   Procedure: FOREIGN BODY REMOVAL;  Surgeon: Ronnette Juniper, MD;  Location: [facility] ENDOSCOPY;  Service: Gastroenterology;;   LOWER EXTREMITY ANGIOGRAM Left 05/27/2018   PERIPHERAL VASCULAR INTERVENTION Left 05/27/2018   Procedure: PERIPHERAL VASCULAR INTERVENTION;  Surgeon: Waynetta Sandy, MD;  Location: [place] [facility];  Service: Cardiovascular;  Laterality: Left;   POLYPECTOMY  04/04/2018   Procedure: POLYPECTOMY;  Surgeon: [name], MD;  Location: [facility] ENDOSCOPY;  Service: Endoscopy;;   POLYPECTOMY  10/27/2020   Procedure: POLYPECTOMY;  Surgeon: Arta Silence, MD;  [facility]OS[facility];  Service: Endoscopy;;   TRANSMETATARSAL AMPUTATION Left 05/28/2018   Procedure: AMPUTATION TOES THREE, FOUR AND FIVE on left foot;  Surgeon: Waynetta Sandy, MD;  Location: [place];  Service: Vascular;  Laterality: Left;   WOUND DEBRIDEMENT Left 06/06/2018   Procedure: DEBRIDEMENT WOUND LEFT FOOT;  Surgeon: Waynetta Sandy, MD;  Location: [place];  Service: Vascular;  Laterality: Left;    Prior to Admission medications   Medication Sig Start Date  End Date Taking? Authorizing Provider  ACCU-CHEK GUIDE test strip 2 (two) times daily. as directed 02/06/20  Yes [provider]  Accu-Chek Softclix Lancets lancets 2 (two) times daily. 02/06/20  Yes [provider]  amLODipine (NORVASC) 10 MG tablet Take 1 tablet (10 mg total) by mouth daily. 01/22/20  Yes Wert, Margreta Journey, NP  ARIPiprazole (ABILIFY) 5 MG tablet Take 5 mg by mouth daily. 08/28/20  Yes [provider]  aspirin EC 81 MG EC tablet Take 1 tablet (81 mg total) by mouth daily. 09/21/18  Yes Donzetta Starch, NP  atorvastatin (LIPITOR) 40 MG tablet Take 2 tablets (80 mg total) by mouth daily at 6 PM. 01/03/21  Yes Hongalgi, Lenis Dickinson, MD  Blood Glucose Monitoring Suppl (ACCU-CHEK GUIDE) w/Device KIT USE AS DIRECTED TO CHECK BLOOD SUGAR 02/06/20  Yes [provider]  carboxymethylcellulose (REFRESH PLUS) 0.5 % SOLN 1 drop at bedtime.   Yes [provider]  cloNIDine (CATAPRES) 0.2 MG tablet Take 0.2 mg by mouth at bedtime. 09/24/21  Yes [provider]  hydrALAZINE (APRESOLINE) 100 MG tablet Take 1 tablet (100 mg total) by mouth 3 (three) times daily. 01/22/20  Yes Wert, Margreta Journey, NP  insulin aspart (NOVOLOG) 100 UNIT/ML injection Inject 0-9 Units into the skin 3 (three) times daily with meals. Correction coverage: Sensitive (thin, NPO, renal) CBG < 70: Implement Hypoglycemia protocol. CBG 70 - 120: 0 units CBG 121 - 150: 1 unit CBG 151 - 200: 2 units CBG 201 - 250: 3 units CBG 251 - 300: 5 units CBG 301 - 350: 7 units CBG 351 - 400: 9 units CBG > 400: call MD. 01/03/21  Yes Hongalgi, Lenis Dickinson, MD  insulin glargine (LANTUS) 100 UNIT/ML injection Inject 0.1 mLs (10 Units total) into the skin daily. 01/21/21  Yes Nolberto Hanlon, MD  Insulin lispro (HUMALOG JUNIOR KWIKPEN) 100 UNIT/ML Inject into the skin. Sliding scale   Yes [provider]  lisinopril (ZESTRIL) 40 MG tablet Take 1 tablet (40 mg total) by mouth daily. 01/22/20  Yes Royal Hawthorn, NP  magnesium hydroxide (MILK OF MAGNESIA) 400 MG/5ML suspension Take by mouth daily as needed for mild constipation.   Yes [provider]  mirtazapine (REMERON) 30 MG tablet Take 30 mg by mouth at bedtime.   Yes [provider]  omeprazole (PRILOSEC) 40 MG capsule Take 40 mg by mouth daily.   Yes [provider]  PEG 3350-KCl-NaBcb-NaCl-NaSulf (PEG-3350/ELECTROLYTES) 236 g SOLR  01/13/21  Yes [provider]  polyethylene glycol (MIRALAX / GLYCOLAX) 17 g packet Take 17 g by mouth daily.   Yes [provider]  senna (SENOKOT) 8.6 MG tablet Take 1 tablet by mouth daily.   Yes [provider]  traMADol (ULTRAM) 50 MG tablet Take by mouth every 6 (six) hours as needed.   Yes [provider]  TRULICITY 2.20 UR/4.2HC SOPN Inject into the skin. 08/10/21  Yes [provider]  albuterol (PROVENTIL) (2.5 MG/3ML) 0.083% nebulizer solution Take 3 mLs (2.5 mg total) by nebulization every 4 (four) hours as needed for wheezing or shortness of breath. 07/29/21 07/29/22  Duffy Bruce, MD  carvedilol (COREG) 12.5 MG tablet Take 1 tablet (12.5 mg total)  by mouth 2 (two) times daily. 07/08/21 10/06/21  Kroeger, Lorelee Cover., PA-C  furosemide (LASIX) 20 MG tablet Take 20 mg by mouth daily. Patient not taking: Reported on 10/19/2021 06/10/21   [provider]    Allergies as of 09/27/2021 - Review Complete 09/27/2021  Allergen Reaction Noted   Morphine and related Hives 10/23/2011   Metformin Diarrhea and Other (See Comments) 05/16/2018    Family History  Problem Relation Age of Onset   Stroke Brother 82   Stroke Brother 76   Heart attack Mother 16   Stroke Father 98    Social History   Socioeconomic History   Marital status: Single    Spouse name: Not on file   Number of children: 0   Years of education: Not on file   Highest education level: Not on file  Occupational History   Occupation: disabled  Tobacco Use    Smoking status: Former    Packs/day: 0.25    Years: 28.00    Pack years: 7.00    Types: Cigarettes   Smokeless tobacco: Never  Vaping Use   Vaping Use: Never used  Substance and Sexual Activity   Alcohol use: No    Alcohol/week: 0.0 standard drinks    Comment: rare   Drug use: Yes    Types: Marijuana, "Crack" cocaine    Comment: no drugs for past year   Sexual activity: Not Currently    Birth control/protection: None  Other Topics Concern   Not on file  Social History Narrative   Was a  Quarry manager at [employer]. Lives with godmother.   03/03/21 residing at [facility] since 01/03/21     Social Determinants of Health   Financial Resource Strain: Not on file  Food Insecurity: Not on file  Transportation Needs: Not on file  Physical Activity: Not on file  Stress: Not on file  Social Connections: Not on file  Intimate Partner Violence: Not on file    Review of Systems: See HPI, otherwise negative ROS  Physical Exam: BP (!) 144/110    Pulse 70    Temp (!) 97.3 F (36.3 C) (Temporal)    Resp 20    Ht $R'5\' 1"'dI$  (1.549 m)    Wt 100 kg    LMP 04/26/2018 (Approximate)    SpO2 100%    BMI 41.66 kg/m  General:   Alert,  pleasant and cooperative in NAD Head:  Normocephalic and atraumatic. Neck:  Supple; no masses or thyromegaly. Lungs:  Clear throughout to auscultation.    Heart:  Regular rate and rhythm. Abdomen:  Soft, nontender and nondistended. Normal bowel sounds, without guarding, and without rebound.   Neurologic:  Alert and  oriented x4;  grossly normal neurologically.  Impression/Plan: Zakeria Kulzer is here for an endoscopy to be performed for dysphagia  Risks, benefits, limitations, and alternatives regarding  endoscopy have been reviewed with the patient.  Questions have been answered.  All parties agreeable.   Sherri Sear, MD  10/19/2021, 10:40 AM

## 2021-10-20 ENCOUNTER — Encounter: Payer: Self-pay | Admitting: Gastroenterology

## 2021-10-31 NOTE — Progress Notes (Unsigned)
Cardiology Office Note   Date:  10/31/2021   ID:  Nikiyah Fackler, DOB 1971/04/21, MRN [id number]  PCP:  System, Provider Not In  Cardiologist:   Rollene Rotunda, MD   No chief complaint on file.      History of Present Illness: Diamond Rice is a 51 y.o. female who presents for evaluation of LVH.  The patient was in the hospital with pulmonary embolism following toe amputation.  Her EF is found to be 65 to 70%.  She is noted to have mild to moderate aortic insufficiency.  There was a mention in her notes of having a previous MI but I do not see a record of this.  I do see that she had a previous perfusion study in 2013 without ischemia or infarct.   She reports that there was a heart catheterization some years ago when I see mention in 08/2009.  She said there was some small vessel disease.  Other than that there is not been any significant cardiac history.  She is had difficult to control hypertension with a strong family history of this.  She had a stroke with left-sided weakness.    She was hospitalized at [facility] from 5/2 - 01/03/2021 for bilateral leg pain and dysarthria in the setting of marked hypertension.  It was felt that her recurrent stroke was secondary to small vessel ischemic disease in the setting of uncontrolled hypertension.  Brain MRI showed a 4 to 5 mm area of acute infarction in the left hemispheric white matter of the upper parietal centrum ovale region.  During the hospitalization, she complained of dysphagia and underwent EGD with esophageal dilation and Botox injection for achalasia.  she was in the ED for chest pain.  I reviewed these records for this visit.   This was not thought to be cardiac.  CT demonstrated on PE and no acute findings.  ***   ***   I reviewed these records for this visit.    She is currently at rehab for unspecified period of time.  She is working with physical therapy.  She says she does have some leg pain when she is trying to walk and  she points to her right calf.  She has a left hemiparesis.  She is not describing any new shortness of breath, PND or orthopnea.  Has had no new palpitations, presyncope or syncope.   Past Medical History:  Diagnosis Date   Acute ischemic stroke ([facility]) 12/27/2020   Acute renal failure (ARF) ([facility]) 04/02/2018   Anxiety    Chronic lower back pain 04/17/2012   "just got over some; catched when I walked"   COPD (chronic obstructive pulmonary disease) ([facility])    Critical lower limb ischemia ([facility]) 05/27/2018   Depression    Headache(784.0) 04/17/2012   "~ qod; lately waking up in am w/one"   High cholesterol    Hypertension    Migraines 04/17/2012   Obesity    Sleep apnea    CPAP   Stroke ([facility])    Type II diabetes mellitus ([facility]) 08/28/2002    Past Surgical History:  Procedure Laterality Date   ABDOMINAL AORTOGRAM W/LOWER EXTREMITY Left 05/27/2018   Procedure: ABDOMINAL AORTOGRAM W/LOWER EXTREMITY Runoff and Possible Intervention;  Surgeon: Maeola Harman, MD;  Location: [facility] INVASIVE CV LAB;  Service: Cardiovascular;  Laterality: Left;   APPLICATION OF WOUND VAC Left 06/06/2018   Procedure: APPLICATION OF WOUND VAC;  Surgeon: Maeola Harman, MD;  Location: [facility]  OR;  Service: Vascular;  Laterality: Left;   BALLOON DILATION N/A 12/31/2020   Procedure: BALLOON DILATION;  Surgeon: Ronnette Juniper, MD;  Location: [place];  Service: Gastroenterology;  Laterality: N/A;   BIOPSY  04/04/2018   Procedure: BIOPSY;  Surgeon: [name], MD;  Location: [facility] ENDOSCOPY;  Service: Endoscopy;;   BIOPSY  10/27/2020   Procedure: BIOPSY;  Surgeon: Arta Silence, MD;  [facility] ENDOSCOPY;  Service: Endoscopy;;   BOTOX INJECTION N/A 12/31/2020   Procedure: BOTOX INJECTION;  Surgeon: Ronnette Juniper, MD;  Location: [place];  Service: Gastroenterology;  Laterality: N/A;   CARDIAC CATHETERIZATION  ~ 2011   COLONOSCOPY N/A 04/04/2018   Procedure: COLONOSCOPY;  Surgeon: [name], MD;  Location: [facility]  ENDOSCOPY;  Service: Endoscopy;  Laterality: N/A;   COLONOSCOPY WITH PROPOFOL N/A 10/27/2020   Procedure: COLONOSCOPY WITH PROPOFOL;  Surgeon: Arta Silence, MD;  Location: [facility] ENDOSCOPY;  Service: Endoscopy;  Laterality: N/A;   ESOPHAGOGASTRODUODENOSCOPY N/A 12/31/2020   Procedure: ESOPHAGOGASTRODUODENOSCOPY (EGD);  Surgeon: Ronnette Juniper, MD;  Location: [place];  Service: Gastroenterology;  Laterality: N/A;   ESOPHAGOGASTRODUODENOSCOPY (EGD) WITH PROPOFOL N/A 10/27/2020   Procedure: ESOPHAGOGASTRODUODENOSCOPY (EGD) WITH PROPOFOL  WITH POSSIBLE DIL;  Surgeon: Arta Silence, MD;  [facility]OSCOPY;  Service: Endoscopy;  Laterality: N/A;   ESOPHAGOGASTRODUODENOSCOPY (EGD) WITH PROPOFOL N/A 10/19/2021   Procedure: ESOPHAGOGASTRODUODENOSCOPY (EGD) WITH PROPOFOL;  Surgeon: Lin Landsman, MD;  Location: [place];  Service: Gastroenterology;  Laterality: N/A;   FOREIGN BODY REMOVAL  12/31/2020   Procedure: FOREIGN BODY REMOVAL;  Surgeon: Ronnette Juniper, MD;  Location: [facility] ENDOSCOPY;  Service: Gastroenterology;;   LOWER EXTREMITY ANGIOGRAM Left 05/27/2018   PERIPHERAL VASCULAR INTERVENTION Left 05/27/2018   Procedure: PERIPHERAL VASCULAR INTERVENTION;  Surgeon: Waynetta Sandy, MD;  Location: [place] CV LAB;  Service: Cardiovascular;  Laterality: Left;   POLYPECTOMY  04/04/2018   Procedure: POLYPECTOMY;  Surgeon: [name], MD;  Location: [facility] ENDOSCOPY;  Service: Endoscopy;;   POLYPECTOMY  10/27/2020   Procedure: POLYPECTOMY;  Surgeon: Arta Silence, MD;  Location[facility] ENDOSCOPY;  Service: Endoscopy;;   TRANSMETATARSAL AMPUTATION Left 05/28/2018   Procedure: AMPUTATION TOES THREE, FOUR AND FIVE on left foot;  Surgeon: Waynetta Sandy, MD;  Location: [place];  Service: Vascular;  Laterality: Left;   WOUND DEBRIDEMENT Left 06/06/2018   Procedure: DEBRIDEMENT WOUND LEFT FOOT;  Surgeon: Waynetta Sandy, MD;  Location: [place];  Service: Vascular;  Laterality: Left;      Current Outpatient Medications  Medication Sig Dispense Refill   ACCU-CHEK GUIDE test strip 2 (two) times daily. as directed     Accu-Chek Softclix Lancets lancets 2 (two) times daily.     albuterol (PROVENTIL) (2.5 MG/3ML) 0.083% nebulizer solution Take 3 mLs (2.5 mg total) by nebulization every 4 (four) hours as needed for wheezing or shortness of breath. 75 mL 0   amLODipine (NORVASC) 10 MG tablet Take 1 tablet (10 mg total) by mouth daily. 30 tablet 0   ARIPiprazole (ABILIFY) 5 MG tablet Take 5 mg by mouth daily.     aspirin EC 81 MG EC tablet Take 1 tablet (81 mg total) by mouth daily.     atorvastatin (LIPITOR) 40 MG tablet Take 2 tablets (80 mg total) by mouth daily at 6 PM.     Blood Glucose Monitoring Suppl (ACCU-CHEK GUIDE) w/Device KIT USE AS DIRECTED TO CHECK BLOOD SUGAR     carboxymethylcellulose (REFRESH PLUS) 0.5 % SOLN 1 drop at bedtime.     carvedilol (  COREG) 12.5 MG tablet Take 1 tablet (12.5 mg total) by mouth 2 (two) times daily. 180 tablet 3   cloNIDine (CATAPRES) 0.2 MG tablet Take 0.2 mg by mouth at bedtime.     furosemide (LASIX) 20 MG tablet Take 20 mg by mouth daily. (Patient not taking: Reported on 10/19/2021)     hydrALAZINE (APRESOLINE) 100 MG tablet Take 1 tablet (100 mg total) by mouth 3 (three) times daily. 90 tablet 0   insulin aspart (NOVOLOG) 100 UNIT/ML injection Inject 0-9 Units into the skin 3 (three) times daily with meals. Correction coverage: Sensitive (thin, NPO, renal) CBG < 70: Implement Hypoglycemia protocol. CBG 70 - 120: 0 units CBG 121 - 150: 1 unit CBG 151 - 200: 2 units CBG 201 - 250: 3 units CBG 251 - 300: 5 units CBG 301 - 350: 7 units CBG 351 - 400: 9 units CBG > 400: call MD.     insulin glargine (LANTUS) 100 UNIT/ML injection Inject 0.1 mLs (10 Units total) into the skin daily. 10 mL 0   Insulin lispro (HUMALOG JUNIOR KWIKPEN) 100 UNIT/ML Inject into the skin. Sliding scale     lisinopril (ZESTRIL) 40 MG tablet Take 1 tablet (40  mg total) by mouth daily. 30 tablet 0   magnesium hydroxide (MILK OF MAGNESIA) 400 MG/5ML suspension Take by mouth daily as needed for mild constipation.     mirtazapine (REMERON) 30 MG tablet Take 30 mg by mouth at bedtime.     omeprazole (PRILOSEC) 40 MG capsule Take 40 mg by mouth daily.     PEG 3350-KCl-NaBcb-NaCl-NaSulf (PEG-3350/ELECTROLYTES) 236 g SOLR      polyethylene glycol (MIRALAX / GLYCOLAX) 17 g packet Take 17 g by mouth daily.     senna (SENOKOT) 8.6 MG tablet Take 1 tablet by mouth daily.     traMADol (ULTRAM) 50 MG tablet Take by mouth every 6 (six) hours as needed.     TRULICITY 2.50 NL/9.7QB SOPN Inject into the skin.     No current facility-administered medications for this visit.    Allergies:   Morphine and related and Metformin   ROS:  Please see the history of present illness.   Otherwise, review of systems are positive for ***.   All other systems are reviewed and negative.    PHYSICAL EXAM: VS:  LMP 04/26/2018 (Approximate)  , BMI There is no height or weight on file to calculate BMI. GENERAL:  Well appearing NECK:  No jugular venous distention, waveform within normal limits, carotid upstroke brisk and symmetric, no bruits, no thyromegaly LUNGS:  Clear to auscultation bilaterally CHEST:  Unremarkable HEART:  PMI not displaced or sustained,S1 and S2 within normal limits, no S3, no S4, no clicks, no rubs, *** murmurs ABD:  Flat, positive bowel sounds normal in frequency in pitch, no bruits, no rebound, no guarding, no midline pulsatile mass, no hepatomegaly, no splenomegaly EXT:  2 plus pulses throughout, no edema, no cyanosis no clubbing     ***GEN:  No distress NECK:  No jugular venous distention at 90 degrees, waveform within normal limits, carotid upstroke brisk and symmetric, no bruits, no thyromegaly LYMPHATICS:  No cervical adenopathy LUNGS:  Clear to auscultation bilaterally BACK:  No CVA tenderness CHEST:  Unremarkable HEART:  S1 and S2 within  normal limits, no S3, no S4, no clicks, no rubs, soft apical systolic murmur radiating slightly at aortic outflow tract murmurs ABD:  Positive bowel sounds normal in frequency in pitch, no bruits, no rebound, no  guarding, unable to assess midline mass or bruit with the patient seated. EXT:  2 plus pulses upper, diminished dorsalis pedis and posterior tibialis bilateral lower, trace nonpitting edema, no cyanosis no clubbing SKIN:  No rashes no nodules NEURO: Dense left hemiparesis with mild difficulty speaking PSYCH:  Cognitively intact, oriented to person place and time   EKG:  EKG *** ordered today. ***   Recent Labs: 01/18/2021: Magnesium 2.0 07/05/2021: ALT 21 07/29/2021: B Natriuretic Peptide 26.8; BUN 16; Creatinine, Ser 1.10; Hemoglobin 11.9; Platelets 331; Potassium 3.3; Sodium 139    Lipid Panel    Component Value Date/Time   CHOL 143 01/16/2021 2316   TRIG 57 01/16/2021 2316   HDL 72 01/16/2021 2316   CHOLHDL 2.0 01/16/2021 2316   VLDL 11 01/16/2021 2316   LDLCALC 60 01/16/2021 2316      Wt Readings from Last 3 Encounters:  10/19/21 220 lb 7.4 oz (100 kg)  08/26/21 230 lb (104.3 kg)  07/08/21 248 lb (112.5 kg)      Other studies Reviewed: Additional studies/ records that were reviewed today include: *** Review of the above records demonstrates:  Please see elsewhere in the note.     ASSESSMENT AND PLAN:  Chest pain and abnormal EKG:    ***  She ruled out for MI.  No further work up was suggested.    Hypertension:  ***  s controlled.  She recently had her clonidine increased.  No change in therapy.  History of strokes:  ***   She will continue with risk reduction.  Sleep apnea:  ***  I given a written note to her rehab facility that she needs her home CPAP.  She has been without this.  DM: Her diabetes is ***not at target.  Hemoglobin A1c is 7.6.  I will defer to her primary providers and facility physicians.  LEG PAIN:   ABIs were normal.  ***She does have  reduced pulses and I will order ABIs.  Current medicines are reviewed at length with the patient today.  The patient does not have concerns regarding medicines.  The following changes have been made:  no change  Labs/ tests ordered today include:   No orders of the defined types were placed in this encounter.     Disposition:   FU with me in one year.     Signed, Minus Breeding, MD  10/31/2021 9:09 PM    [place]

## 2021-11-01 ENCOUNTER — Ambulatory Visit: Payer: Medicare Other | Admitting: Cardiology

## 2021-11-01 DIAGNOSIS — R072 Precordial pain: Secondary | ICD-10-CM

## 2021-11-01 DIAGNOSIS — I10 Essential (primary) hypertension: Secondary | ICD-10-CM

## 2021-11-01 DIAGNOSIS — E1151 Type 2 diabetes mellitus with diabetic peripheral angiopathy without gangrene: Secondary | ICD-10-CM

## 2021-11-09 ENCOUNTER — Encounter: Payer: Self-pay | Admitting: Cardiology

## 2021-11-15 ENCOUNTER — Other Ambulatory Visit (INDEPENDENT_AMBULATORY_CARE_PROVIDER_SITE_OTHER): Payer: Self-pay | Admitting: Nurse Practitioner

## 2021-11-15 DIAGNOSIS — I779 Disorder of arteries and arterioles, unspecified: Secondary | ICD-10-CM

## 2021-11-16 ENCOUNTER — Ambulatory Visit (INDEPENDENT_AMBULATORY_CARE_PROVIDER_SITE_OTHER): Payer: Medicare Other

## 2021-11-16 ENCOUNTER — Encounter (INDEPENDENT_AMBULATORY_CARE_PROVIDER_SITE_OTHER): Payer: Self-pay | Admitting: Nurse Practitioner

## 2021-11-16 ENCOUNTER — Other Ambulatory Visit: Payer: Self-pay

## 2021-11-16 ENCOUNTER — Encounter (INDEPENDENT_AMBULATORY_CARE_PROVIDER_SITE_OTHER): Payer: Self-pay

## 2021-11-16 ENCOUNTER — Ambulatory Visit (INDEPENDENT_AMBULATORY_CARE_PROVIDER_SITE_OTHER): Payer: Medicare Other | Admitting: Nurse Practitioner

## 2021-11-16 VITALS — BP 140/90 | HR 68 | Resp 16

## 2021-11-16 DIAGNOSIS — I779 Disorder of arteries and arterioles, unspecified: Secondary | ICD-10-CM | POA: Diagnosis not present

## 2021-11-16 DIAGNOSIS — Z9889 Other specified postprocedural states: Secondary | ICD-10-CM | POA: Diagnosis not present

## 2021-11-16 DIAGNOSIS — E119 Type 2 diabetes mellitus without complications: Secondary | ICD-10-CM

## 2021-11-16 DIAGNOSIS — I739 Peripheral vascular disease, unspecified: Secondary | ICD-10-CM | POA: Diagnosis not present

## 2021-11-16 DIAGNOSIS — Z794 Long term (current) use of insulin: Secondary | ICD-10-CM | POA: Diagnosis not present

## 2021-11-20 ENCOUNTER — Encounter (INDEPENDENT_AMBULATORY_CARE_PROVIDER_SITE_OTHER): Payer: Self-pay | Admitting: Nurse Practitioner

## 2021-11-20 NOTE — Progress Notes (Signed)
? ?Subjective:  ? ? Patient ID: Diamond Rice, female    DOB: March 17, 1971, 51 y.o.   MRN: [id number] ?Chief Complaint  ?Patient presents with  ? Follow-up  ?  ultrasound  ? ? ?Diamond Rice is a 51 year old female that presents today for evaluation due to PAD.  At her previous office visit she was having issues with cramping pain in the bed at night.  Today she notes she has not had those issues.  She denies claudication however she does not ambulate frequently.  She denies any open wounds or ulcerations.  Overall currently she denies any issues with her legs. ? ?Today the patient has an ABI of 1.13 on the right and 0.98 on the left.  The waveforms are hyperemic bilaterally, likely due to some edema but the waveforms are multiphasic with triphasic waveforms at the popliteal artery.  The right toe waveforms are normal with the left slightly dampened ? ? ? ? ?Review of Systems  ?Musculoskeletal:  Positive for gait problem.  ?All other systems reviewed and are negative. ? ?   ?Objective:  ? Physical Exam ?Vitals reviewed.  ?Cardiovascular:  ?   Rate and Rhythm: Normal rate.  ?   Pulses:     ?     Dorsalis pedis pulses are detected w/ Doppler on the right side and detected w/ Doppler on the left side.  ?     Posterior tibial pulses are detected w/ Doppler on the right side and detected w/ Doppler on the left side.  ?Pulmonary:  ?   Effort: Pulmonary effort is normal.  ?Musculoskeletal:  ?   Left Lower Extremity: Left leg is amputated below ankle.  ?Skin: ?   General: Skin is warm and dry.  ?Neurological:  ?   Mental Status: She is alert. Mental status is at baseline.  ?Psychiatric:     ?   Mood and Affect: Mood normal.     ?   Behavior: Behavior normal.     ?   Thought Content: Thought content normal.     ?   Judgment: Judgment normal.  ? ? ?BP 140/90 (BP Location: Right Arm)   Pulse 68   Resp 16   LMP 04/26/2018 (Approximate)  ? ?Past Medical History:  ?Diagnosis Date  ? Acute ischemic stroke ([place]) 12/27/2020  ?  Acute renal failure (ARF) ([place]) 04/02/2018  ? Anxiety   ? Chronic lower back pain 04/17/2012  ? "just got over some; catched when I walked"  ? COPD (chronic obstructive pulmonary disease) ([place])   ? Critical lower limb ischemia ([place]) 05/27/2018  ? Depression   ? Headache(784.0) 04/17/2012  ? "~ qod; lately waking up in am w/one"  ? High cholesterol   ? Hypertension   ? Migraines 04/17/2012  ? Obesity   ? Sleep apnea   ? CPAP  ? Stroke [facility])   ? Type II diabetes mellitus ([place]) 08/28/2002  ? ? ?Social History  ? ?Socioeconomic History  ? Marital status: Single  ?  Spouse name: Not on file  ? Number of children: 0  ? Years of education: Not on file  ? Highest education level: Not on file  ?Occupational History  ? Occupation: disabled  ?Tobacco Use  ? Smoking status: Former  ?  Packs/day: 0.25  ?  Years: 28.00  ?  Pack years: 7.00  ?  Types: Cigarettes  ? Smokeless tobacco: Never  ?Vaping Use  ? Vaping Use: Never used  ?Substance and Sexual Activity  ?  Alcohol use: No  ?  Alcohol/week: 0.0 standard drinks  ?  Comment: rare  ? Drug use: Yes  ?  Types: Marijuana, "Crack" cocaine  ?  Comment: no drugs for past year  ? Sexual activity: Not Currently  ?  Birth control/protection: None  ?Other Topics Concern  ? Not on file  ?Social History Narrative  ? Was a  CNA at [employer]. Lives with godmother.  ? 03/03/21 residing at [facility] since 01/03/21    ? ?Social Determinants of Health  ? ?Financial Resource Strain: Not on file  ?Food Insecurity: Not on file  ?Transportation Needs: Not on file  ?Physical Activity: Not on file  ?Stress: Not on file  ?Social Connections: Not on file  ?Intimate Partner Violence: Not on file  ? ? ?Past Surgical History:  ?Procedure Laterality Date  ? ABDOMINAL AORTOGRAM W/LOWER EXTREMITY Left 05/27/2018  ? Procedure: ABDOMINAL AORTOGRAM W/LOWER EXTREMITY Runoff and Possible Intervention;  Surgeon: Waynetta Sandy, MD;  Location: [place] [facility];  Service: Cardiovascular;  Laterality:  Left;  ? APPLICATION OF WOUND VAC Left 06/06/2018  ? Procedure: APPLICATION OF WOUND VAC;  Surgeon: Waynetta Sandy, MD;  Location: [place];  Service: Vascular;  Laterality: Left;  ? BALLOON DILATION N/A 12/31/2020  ? Procedure: BALLOON DILATION;  Surgeon: Ronnette Juniper, MD;  Location: [place];  Service: Gastroenterology;  Laterality: N/A;  ? BIOPSY  04/04/2018  ? Procedure: BIOPSY;  Surgeon: [name], MD;  Location: [facility] ENDOSCOPY;  Service: Endoscopy;;  ? BIOPSY  10/27/2020  ? Procedure: BIOPSY;  Surgeon: Arta Silence, MD;  Location: [facility] ENDOSCOPY;  Service: Endoscopy;;  ? BOTOX INJECTION N/A 12/31/2020  ? Procedure: BOTOX INJECTION;  Surgeon: Ronnette Juniper, MD;  Location: [place];  Service: Gastroenterology;  Laterality: N/A;  ? CARDIAC CATHETERIZATION  ~ 2011  ? COLONOSCOPY N/A 04/04/2018  ? Procedure: COLONOSCOPY;  Surgeon: [name], MD;  Location: [facility] ENDOSCOPY;  Service: Endoscopy;  Laterality: N/A;  ? COLONOSCOPY WITH PROPOFOL N/A 10/27/2020  ? Procedure: COLONOSCOPY WITH PROPOFOL;  Surgeon: Arta Silence, MD;  Location: [facility]OSCOPY;  Service: Endoscopy;  Laterality: N/A;  ? ESOPHAGOGASTRODUODENOSCOPY N/A 12/31/2020  ? Procedure: ESOPHAGOGASTRODUODENOSCOPY (EGD);  Surgeon: Ronnette Juniper, MD;  Location: [place];  Service: Gastroenterology;  Laterality: N/A;  ? ESOPHAGOGASTRODUODENOSCOPY (EGD) WITH PROPOFOL N/A 10/27/2020  ? Procedure: ESOPHAGOGASTRODUODENOSCOPY (EGD) WITH PROPOFOL  WITH POSSIBLE DIL;  Surgeon: Arta Silence, MD;  [facility] ENDOSCOPY;  Service: Endoscopy;  Laterality: N/A;  ? ESOPHAGOGASTRODUODENOSCOPY (EGD) WITH PROPOFOL N/A 10/19/2021  ? Procedure: ESOPHAGOGASTRODUODENOSCOPY (EGD) WITH PROPOFOL;  Surgeon: Lin Landsman, MD;  Location: [facility] ENDOSCOPY;  Service: Gastroenterology;  Laterality: N/A;  ? FOREIGN BODY REMOVAL  12/31/2020  ? Procedure: FOREIGN BODY REMOVAL;  Surgeon: Ronnette Juniper, MD;  Location: [place];  Service: Gastroenterology;;  ? LOWER EXTREMITY  ANGIOGRAM Left 05/27/2018  ? PERIPHERAL VASCULAR INTERVENTION Left 05/27/2018  ? Procedure: PERIPHERAL VASCULAR INTERVENTION;  Surgeon: Waynetta Sandy, MD;  Location: [place] [facility];  Service: Cardiovascular;  Laterality: Left;  ? POLYPECTOMY  04/04/2018  ? Procedure: POLYPECTOMY;  Surgeon: [name], MD;  Location: [facility] ENDOSCOPY;  Service: Endoscopy;;  ? POLYPECTOMY  10/27/2020  ? Procedure: POLYPECTOMY;  Surgeon: Arta Silence, MD;  [facility];  Service: Endoscopy;;  ? TRANSMETATARSAL AMPUTATION Left 05/28/2018  ? Procedure: AMPUTATION TOES THREE, FOUR AND FIVE on left foot;  Surgeon: Waynetta Sandy, MD;  Location: [place];  Service: Vascular;  Laterality: Left;  ? WOUND  DEBRIDEMENT Left 06/06/2018  ? Procedure: DEBRIDEMENT WOUND LEFT FOOT;  Surgeon: Waynetta Sandy, MD;  Location: [place];  Service: Vascular;  Laterality: Left;  ? ? ?Family History  ?Problem Relation Age of Onset  ? Stroke Brother 30  ? Stroke Brother 85  ? Heart attack Mother 34  ? Stroke Father 5  ? ? ?Allergies  ?Allergen Reactions  ? Morphine And Related Hives  ? Metformin Diarrhea and Other (See Comments)  ?  "Allergic," per MAR  ? ? ? ?  Latest Ref Rng & Units 07/29/2021  ?  2:38 PM 07/05/2021  ?  8:02 PM 05/22/2021  ?  4:47 PM  ?CBC  ?WBC 4.0 - 10.5 K/uL 8.6   8.9   8.3    ?Hemoglobin 12.0 - 15.0 g/dL 11.9   10.2   12.1    ?Hematocrit 36.0 - 46.0 % 39.2   33.4   37.8    ?Platelets 150 - 400 K/uL 331   286   342    ? ? ? ? ?CMP  ?   ?Component Value Date/Time  ? NA 139 07/29/2021 1438  ? NA 141 12/17/2019 0000  ? K 3.3 (L) 07/29/2021 1438  ? CL 102 07/29/2021 1438  ? CO2 29 07/29/2021 1438  ? GLUCOSE 137 (H) 07/29/2021 1438  ? BUN 16 07/29/2021 1438  ? BUN 14 12/17/2019 0000  ? CREATININE 1.10 (H) 07/29/2021 1438  ? CREATININE 1.81 (H) 05/23/2018 1620  ? CALCIUM 9.6 07/29/2021 1438  ? PROT 7.1 07/05/2021 2002  ? ALBUMIN 3.3 (L) 07/05/2021 2002  ? AST 17 07/05/2021 2002  ? ALT 21 07/05/2021 2002  ?  ALKPHOS 82 07/05/2021 2002  ? BILITOT 0.5 07/05/2021 2002  ? GFRNONAA >60 07/29/2021 1438  ? GFRNONAA 54 (L) 09/28/2014 1034  ? GFRAA >60 02/17/2020 1940  ? GFRAA 62 09/28/2014 1034  ? ? ? ?No results found. ? ?

## 2021-12-19 DIAGNOSIS — M79606 Pain in leg, unspecified: Secondary | ICD-10-CM | POA: Insufficient documentation

## 2021-12-19 NOTE — Progress Notes (Signed)
?  ?Cardiology Office Note ? ? ?Date:  12/20/2021  ? ?ID:  Diamond Rice, DOB Oct 19, 1970, MRN [id number] ? ?PCP:  System, Provider Not In  ?Cardiologist:   Minus Breeding, MD ? ? ?Chief Complaint  ?Patient presents with  ? PVD  ? ? ? ?  ?History of Present Illness: ?Diamond Rice is a 50 y.o. female who presents for evaluation of LVH.  The patient was in the hospital with pulmonary embolism following toe amputation.  Her EF is found to be 65 to 70%.  She is noted to have mild to moderate aortic insufficiency.  There was a mention in her notes of having a previous MI but I do not see a record of this.  I do see that she had a previous perfusion study in 2013 without ischemia or infarct.   She reports that there was a heart catheterization some years ago when I see mention in 08/2009.  She said there was some small vessel disease.  Other than that there is not been any significant cardiac history.  She is had difficult to control hypertension with a strong family history of this.  She had a stroke with left-sided weakness.   That was felt to be nonanginal.  There was no objective evidence of ischemia.  There is some shortness of breath and she was apparently treated for bronchitis. ? ?Her blood pressure is currently well controlled on the meds as listed.  She denies any chest pressure, neck or arm discomfort.  She reports no new shortness of breath, PND or orthopnea.  She had no palpitations, presyncope or syncope. ? ? ?Past Medical History:  ?Diagnosis Date  ? Acute ischemic stroke ([place]) 12/27/2020  ? Acute renal failure (ARF) ([place]) 04/02/2018  ? Anxiety   ? Chronic lower back pain 04/17/2012  ? "just got over some; catched when I walked"  ? COPD (chronic obstructive pulmonary disease) ([place])   ? Critical lower limb ischemia ([place]) 05/27/2018  ? Depression   ? Headache(784.0) 04/17/2012  ? "~ qod; lately waking up in am w/one"  ? High cholesterol   ? Hypertension   ? Migraines 04/17/2012  ? Obesity   ? Sleep apnea   ?  CPAP  ? Stroke [facility])   ? Type II diabetes mellitus ([place]) 08/28/2002  ? ? ?Past Surgical History:  ?Procedure Laterality Date  ? ABDOMINAL AORTOGRAM W/LOWER EXTREMITY Left 05/27/2018  ? Procedure: ABDOMINAL AORTOGRAM W/LOWER EXTREMITY Runoff and Possible Intervention;  Surgeon: Waynetta Sandy, MD;  Location: [place] [facility];  Service: Cardiovascular;  Laterality: Left;  ? APPLICATION OF WOUND VAC Left 06/06/2018  ? Procedure: APPLICATION OF WOUND VAC;  Surgeon: Waynetta Sandy, MD;  Location: [place];  Service: Vascular;  Laterality: Left;  ? BALLOON DILATION N/A 12/31/2020  ? Procedure: BALLOON DILATION;  Surgeon: Ronnette Juniper, MD;  Location: [place];  Service: Gastroenterology;  Laterality: N/A;  ? BIOPSY  04/04/2018  ? Procedure: BIOPSY;  Surgeon: [name], MD;  Location: [facility] ENDOSCOPY;  Service: Endoscopy;;  ? BIOPSY  10/27/2020  ? Procedure: BIOPSY;  Surgeon: Arta Silence, MD;  [facility] ENDOSCOPY;  Service: Endoscopy;;  ? BOTOX INJECTION N/A 12/31/2020  ? Procedure: BOTOX INJECTION;  Surgeon: Ronnette Juniper, MD;  Location: [place];  Service: Gastroenterology;  Laterality: N/A;  ? CARDIAC CATHETERIZATION  ~ 2011  ? COLONOSCOPY N/A 04/04/2018  ? Procedure: COLONOSCOPY;  Surgeon: [name], MD;  Location: [facility] ENDOSCOPY;  Service: Endoscopy;  Laterality: N/A;  ?  COLONOSCOPY WITH PROPOFOL N/A 10/27/2020  ? Procedure: COLONOSCOPY WITH PROPOFOL;  Surgeon: Arta Silence, MD;  Location[facility] ENDOSCOPY;  Service: Endoscopy;  Laterality: N/A;  ? ESOPHAGOGASTRODUODENOSCOPY N/A 12/31/2020  ? Procedure: ESOPHAGOGASTRODUODENOSCOPY (EGD);  Surgeon: Ronnette Juniper, MD;  Location: [place];  Service: Gastroenterology;  Laterality: N/A;  ? ESOPHAGOGASTRODUODENOSCOPY (EGD) WITH PROPOFOL N/A 10/27/2020  ? Procedure: ESOPHAGOGASTRODUODENOSCOPY (EGD) WITH PROPOFOL  WITH POSSIBLE DIL;  Surgeon: Arta Silence, MD;  Location[facility] ENDOSCOPY;  Service: Endoscopy;  Laterality: N/A;  ? ESOPHAGOGASTRODUODENOSCOPY  (EGD) WITH PROPOFOL N/A 10/19/2021  ? Procedure: ESOPHAGOGASTRODUODENOSCOPY (EGD) WITH PROPOFOL;  Surgeon: Lin Landsman, MD;  Location: [facility] ENDOSCOPY;  Service: Gastroenterology;  Laterality: N/A;  ? FOREIGN BODY REMOVAL  12/31/2020  ? Procedure: FOREIGN BODY REMOVAL;  Surgeon: Ronnette Juniper, MD;  Location: [place];  Service: Gastroenterology;;  ? LOWER EXTREMITY ANGIOGRAM Left 05/27/2018  ? PERIPHERAL VASCULAR INTERVENTION Left 05/27/2018  ? Procedure: PERIPHERAL VASCULAR INTERVENTION;  Surgeon: Waynetta Sandy, MD;  Location: [place] [facility];  Service: Cardiovascular;  Laterality: Left;  ? POLYPECTOMY  04/04/2018  ? Procedure: POLYPECTOMY;  Surgeon: [name], MD;  Location: [facility] ENDOSCOPY;  Service: Endoscopy;;  ? POLYPECTOMY  10/27/2020  ? Procedure: POLYPECTOMY;  Surgeon: Arta Silence, MD;  Location[facility] ENDOSCOPY;  Service: Endoscopy;;  ? TRANSMETATARSAL AMPUTATION Left 05/28/2018  ? Procedure: AMPUTATION TOES THREE, FOUR AND FIVE on left foot;  Surgeon: Waynetta Sandy, MD;  Location: [place];  Service: Vascular;  Laterality: Left;  ? WOUND DEBRIDEMENT Left 06/06/2018  ? Procedure: DEBRIDEMENT WOUND LEFT FOOT;  Surgeon: Waynetta Sandy, MD;  Location: [place];  Service: Vascular;  Laterality: Left;  ? ? ? ?Current Outpatient Medications  ?Medication Sig Dispense Refill  ? ACCU-CHEK GUIDE test strip 2 (two) times daily. as directed    ? Accu-Chek Softclix Lancets lancets 2 (two) times daily.    ? albuterol (PROVENTIL) (2.5 MG/3ML) 0.083% nebulizer solution Take 3 mLs (2.5 mg total) by nebulization every 4 (four) hours as needed for wheezing or shortness of breath. 75 mL 0  ? amLODipine (NORVASC) 10 MG tablet Take 1 tablet (10 mg total) by mouth daily. 30 tablet 0  ? ARIPiprazole (ABILIFY) 5 MG tablet Take 5 mg by mouth daily.    ? aspirin EC 81 MG EC tablet Take 1 tablet (81 mg total) by mouth daily.    ? atorvastatin (LIPITOR) 40 MG tablet Take 2 tablets (80 mg total) by  mouth daily at 6 PM.    ? Blood Glucose Monitoring Suppl (ACCU-CHEK GUIDE) w/Device KIT USE AS DIRECTED TO CHECK BLOOD SUGAR    ? carboxymethylcellulose (REFRESH PLUS) 0.5 % SOLN 1 drop at bedtime.    ? carvedilol (COREG) 12.5 MG tablet Take 12.5 mg by mouth 2 (two) times daily with a meal.    ? cloNIDine (CATAPRES - DOSED IN MG/24 HR) 0.3 mg/24hr patch 0.3 mg once a week.    ? cloNIDine (CATAPRES) 0.2 MG tablet Take 0.2 mg by mouth at bedtime.    ? FLUoxetine (PROZAC) 20 MG capsule Take 20 mg by mouth daily.    ? furosemide (LASIX) 20 MG tablet Take 20 mg by mouth daily.    ? hydrALAZINE (APRESOLINE) 100 MG tablet Take 1 tablet (100 mg total) by mouth 3 (three) times daily. 90 tablet 0  ? insulin aspart (NOVOLOG) 100 UNIT/ML injection Inject 0-9 Units into the skin 3 (three) times daily with meals. Correction coverage: Sensitive (thin, NPO, renal) ?CBG < 70:  Implement Hypoglycemia protocol. ?CBG 70 - 120: 0 units ?CBG 121 - 150: 1 unit ?CBG 151 - 200: 2 units ?CBG 201 - 250: 3 units ?CBG 251 - 300: 5 units ?CBG 301 - 350: 7 units ?CBG 351 - 400: 9 units ?CBG > 400: call MD.    ? insulin glargine (LANTUS) 100 UNIT/ML injection Inject 0.1 mLs (10 Units total) into the skin daily. 10 mL 0  ? Insulin lispro (HUMALOG JUNIOR KWIKPEN) 100 UNIT/ML Inject into the skin. Sliding scale    ? lisinopril (ZESTRIL) 40 MG tablet Take 1 tablet (40 mg total) by mouth daily. 30 tablet 0  ? magnesium hydroxide (MILK OF MAGNESIA) 400 MG/5ML suspension Take by mouth daily as needed for mild constipation.    ? Melatonin 5 MG CHEW Chew by mouth.    ? mirtazapine (REMERON) 30 MG tablet Take 30 mg by mouth at bedtime.    ? omeprazole (PRILOSEC) 40 MG capsule Take 40 mg by mouth daily.    ? PEG 3350-KCl-NaBcb-NaCl-NaSulf (PEG-3350/ELECTROLYTES) 236 g SOLR     ? polyethylene glycol (MIRALAX / GLYCOLAX) 17 g packet Take 17 g by mouth daily.    ? senna (SENOKOT) 8.6 MG tablet Take 1 tablet by mouth daily.    ? traMADol (ULTRAM) 50 MG tablet  Take by mouth every 6 (six) hours as needed.    ? TRULICITY 5.78 XB/8.4RQ SOPN Inject into the skin.    ? carvedilol (COREG) 12.5 MG tablet Take 1 tablet (12.5 mg total) by mouth 2 (two) times daily. 180 tablet 3

## 2021-12-20 ENCOUNTER — Ambulatory Visit (INDEPENDENT_AMBULATORY_CARE_PROVIDER_SITE_OTHER): Payer: Medicare Other | Admitting: Cardiology

## 2021-12-20 ENCOUNTER — Encounter: Payer: Self-pay | Admitting: Cardiology

## 2021-12-20 VITALS — BP 130/85 | HR 72 | Ht 65.0 in

## 2021-12-20 DIAGNOSIS — G4730 Sleep apnea, unspecified: Secondary | ICD-10-CM

## 2021-12-20 DIAGNOSIS — R072 Precordial pain: Secondary | ICD-10-CM | POA: Diagnosis not present

## 2021-12-20 DIAGNOSIS — M79606 Pain in leg, unspecified: Secondary | ICD-10-CM

## 2021-12-20 DIAGNOSIS — E118 Type 2 diabetes mellitus with unspecified complications: Secondary | ICD-10-CM

## 2021-12-20 DIAGNOSIS — I10 Essential (primary) hypertension: Secondary | ICD-10-CM

## 2021-12-20 DIAGNOSIS — I351 Nonrheumatic aortic (valve) insufficiency: Secondary | ICD-10-CM

## 2021-12-20 NOTE — Patient Instructions (Addendum)
Medication Instructions:  ?Your Physician recommend you continue on your current medication as directed.   ? ?*If you need a refill on your cardiac medications before your next appointment, please call your pharmacy* ? ? ?Lab Work: ?Your physician recommends that you have lab work drawn out your facility (fasting lipid).  ? ? ? ?Testing/Procedures: ?Your physician has requested that you have an echocardiogram in May, 2023. Echocardiography is a painless test that uses sound waves to create images of your heart. It provides your doctor with information about the size and shape of your heart and how well your heart?s chambers and valves are working. This procedure takes approximately one hour. There are no restrictions for this procedure. ?[place] [address] ? ? ? ?Follow-Up: ?At [facility], you and your health needs are our priority.  As part of our continuing mission to provide you with exceptional heart care, we have created designated Provider Care Teams.  These Care Teams include your primary Cardiologist (physician) and Advanced Practice Providers (APPs -  Physician Assistants and Nurse Practitioners) who all work together to provide you with the care you need, when you need it. ? ?We recommend signing up for the patient portal called "MyChart".  Sign up information is provided on this After Visit Summary.  MyChart is used to connect with patients for Virtual Visits (Telemedicine).  Patients are able to view lab/test results, encounter notes, upcoming appointments, etc.  Non-urgent messages can be sent to your provider as well.   ?To learn more about what you can do with MyChart, go to [email or web address].   ? ?Your next appointment:   ?1 year(s) ? ?The format for your next appointment:   ?In Person ? ?Provider:   ?APP ? ? ?Important Information About [place] ? ? ? ? ? ? ?

## 2021-12-23 ENCOUNTER — Other Ambulatory Visit: Payer: Self-pay

## 2021-12-26 ENCOUNTER — Telehealth: Payer: Self-pay

## 2021-12-26 ENCOUNTER — Encounter: Payer: Self-pay | Admitting: Gastroenterology

## 2021-12-26 ENCOUNTER — Ambulatory Visit (INDEPENDENT_AMBULATORY_CARE_PROVIDER_SITE_OTHER): Payer: Medicare Other | Admitting: Gastroenterology

## 2021-12-26 VITALS — BP 124/89 | HR 75 | Temp 98.6°F | Ht 65.0 in | Wt 230.0 lb

## 2021-12-26 DIAGNOSIS — R1310 Dysphagia, unspecified: Secondary | ICD-10-CM

## 2021-12-26 DIAGNOSIS — Z8673 Personal history of transient ischemic attack (TIA), and cerebral infarction without residual deficits: Secondary | ICD-10-CM | POA: Diagnosis not present

## 2021-12-26 NOTE — Telephone Encounter (Signed)
Patient finished her appointment at 2:37pm. Patient did not know the number to call for transportation. Called the [facility] and she states she would call [facility] for transportation . At 3:00pm. Patient was still not picked up so I called [facility] back and she said she did not know when they would be there but has told them she was ready for pick up. Asked if they would come up to get her and she said she did not know. Asked for the number for [facility] which was [phone] and called them 2 times a left a message for call back  ?

## 2021-12-26 NOTE — Progress Notes (Signed)
?  ?Cephas Darby, MD ?[address]  ?[address]  ?[place], [place] [address]  ?Main: [phone]  ?Fax: [phone] ? ? ? ?Gastroenterology Consultation ? ?Referring Provider:     No ref. provider found ?Primary Care Physician:  System, Provider Not In ?Primary Gastroenterologist:  Dr. Cephas Darby ?Reason for Consultation:     Dysphagia, esophageal stricture ?      ? HPI:   ?Diamond Rice is a 51 y.o. female referred by Dr. Estanislado Spire, Provider Not In  for consultation & management of esophageal stricture, difficulty swallowing.  [name] has history of metabolic syndrome, multiple strokes resulting in left hemiplegia.  Patient was evaluated by GI in early May 2022 for dysphagia, underwent upper endoscopy had retained food in the lower esophagus which was removed, received Botox injection as well as balloon dilation to 20 mm.  Patient currently lives at [facility] health care center.  She does report symptoms of difficulty swallowing, food getting stuck in her chest.  She is currently taking Prilosec 40 mg once daily. ?She is in wheelchair ? ?Follow-up visit 12/26/2021 ?Patient is here for follow-up of dysphagia.  She underwent upper endoscopy in 2/23 which was unremarkable.  I referred patient to speech pathology, which has not been not be scheduled yet. ?Patient reports that she is no longer experiencing difficulty swallowing other than occasional episodes where she would have sensation of food stuck and regurgitate.  She is taking Pepcid 40 mg daily. ? ?NSAIDs: None ? ?Antiplts/Anticoagulants/Anti thrombotics: None ? ?GI Procedures:  ?EGD 10/19/2021 ?- Normal esophagus and gastroesophageal junction, no evidence of esophageal stricture. ?- Normal stomach. ?- Normal examined duodenum. ?- No specimens collected. ? ?EGD 12/31/2020 ? ?- Dilation in the upper third of the esophagus, in the middle third of the esophagus and in ?the lower third of the esophagus. ?- Benign-appearing esophageal stenosis. Dilated to  20 mm. Injected with botulinum toxin. ?- Tortuous esophagus. ?- A small amount of food (residue) in the stomach. ?- Normal stomach. ?- Normal examined duodenum. ?- No specimens collected. ? ?Colonoscopy 10/27/2020 ? ?- Diverticulosis in the sigmoid colon and in the descending colon. ?- One 5 mm polyp in the descending colon, removed with a cold snare. Resected and ?retrieved. ?- The examined portion of the ileum was normal. ? ?Upper endoscopy 10/27/2020 ?- Normal esophagus. Suspect dysphagia is likely from history of stroke. ?- Gastritis. Biopsied. ?- Normal duodenal bulb, first portion of the duodenum and second portion of the ?duodenum. Biopsied. ? ?FINAL MICROSCOPIC DIAGNOSIS:  ? ?A. GASTRIC, BIOPSY:  ?- Chronic active gastritis with Helicobacter pylori.  ?- Warthin-Starry is positive for Helicobacter pylori.  ?- No intestinal metaplasia, dysplasia, or malignancy.  ? ?B. DUODENUM, BIOPSY:  ?- Chronic active gastritis with Helicobacter pylori.  ?- Warthin-Starry is positive for Helicobacter pylori.  ?- No intestinal metaplasia, dysplasia, or malignancy.  ? ?C. COLON, DESCENDING, POLYPECTOMY:  ?- Tubular adenoma.  ?- No high grade dysplasia or malignancy.  ? ?Colonoscopy 04/04/2018 ?Diagnosis ?1. Colon, polyp(s), Right Ascending ?- TUBULAR ADENOMA(S). ?- HIGH GRADE DYSPLASIA IS NOT IDENTIFIED. ?2. Colon, polyp(s), Left Descending ?- TUBULAR ADENOMA(S). ?- HIGH GRADE DYSPLASIA IS NOT IDENTIFIED. ?3. Rectum, biopsy ?- INFLAMED AND ULCERATED COLORECTAL TYPE MUCOSA. ?- THERE IS NO EVIDENCE OF MALIGNANCY. ?- SEE COMMENT. ? ?Past Medical History:  ?Diagnosis Date  ? Acute ischemic stroke ([place]) 12/27/2020  ? Acute renal failure (ARF) ([place]) 04/02/2018  ? Anxiety   ? Chronic lower back pain 04/17/2012  ? "just  got over some; catched when I walked"  ? COPD (chronic obstructive pulmonary disease) ([place])   ? Critical lower limb ischemia ([place]) 05/27/2018  ? Depression   ? Headache(784.0) 04/17/2012  ? "~ qod; lately waking up in am w/one"   ? High cholesterol   ? Hypertension   ? Migraines 04/17/2012  ? Obesity   ? Sleep apnea   ? CPAP  ? Stroke [facility])   ? Type II diabetes mellitus ([place]) 08/28/2002  ? ? ?Past Surgical History:  ?Procedure Laterality Date  ? ABDOMINAL AORTOGRAM W/LOWER EXTREMITY Left 05/27/2018  ? Procedure: ABDOMINAL AORTOGRAM W/LOWER EXTREMITY Runoff and Possible Intervention;  Surgeon: Waynetta Sandy, MD;  Location: [place] [facility];  Service: Cardiovascular;  Laterality: Left;  ? APPLICATION OF WOUND VAC Left 06/06/2018  ? Procedure: APPLICATION OF WOUND VAC;  Surgeon: Waynetta Sandy, MD;  Location: [place];  Service: Vascular;  Laterality: Left;  ? BALLOON DILATION N/A 12/31/2020  ? Procedure: BALLOON DILATION;  Surgeon: Ronnette Juniper, MD;  Location: [place];  Service: Gastroenterology;  Laterality: N/A;  ? BIOPSY  04/04/2018  ? Procedure: BIOPSY;  Surgeon: [name], MD;  Location: [facility] ENDOSCOPY;  Service: Endoscopy;;  ? BIOPSY  10/27/2020  ? Procedure: BIOPSY;  Surgeon: Arta Silence, MD;  Location: [facility] ENDOSCOPY;  Service: Endoscopy;;  ? BOTOX INJECTION N/A 12/31/2020  ? Procedure: BOTOX INJECTION;  Surgeon: Ronnette Juniper, MD;  Location: [place];  Service: Gastroenterology;  Laterality: N/A;  ? CARDIAC CATHETERIZATION  ~ 2011  ? COLONOSCOPY N/A 04/04/2018  ? Procedure: COLONOSCOPY;  Surgeon: [name], MD;  Location: [facility] ENDOSCOPY;  Service: Endoscopy;  Laterality: N/A;  ? COLONOSCOPY WITH PROPOFOL N/A 10/27/2020  ? Procedure: COLONOSCOPY WITH PROPOFOL;  Surgeon: Arta Silence, MD;  Location: [facility];  Service: Endoscopy;  Laterality: N/A;  ? ESOPHAGOGASTRODUODENOSCOPY N/A 12/31/2020  ? Procedure: ESOPHAGOGASTRODUODENOSCOPY (EGD);  Surgeon: Ronnette Juniper, MD;  Location: [place];  Service: Gastroenterology;  Laterality: N/A;  ? ESOPHAGOGASTRODUODENOSCOPY (EGD) WITH PROPOFOL N/A 10/27/2020  ? Procedure: ESOPHAGOGASTRODUODENOSCOPY (EGD) WITH PROPOFOL  WITH POSSIBLE DIL;  Surgeon: Arta Silence, MD;   Location: [facility];  Service: Endoscopy;  Laterality: N/A;  ? ESOPHAGOGASTRODUODENOSCOPY (EGD) WITH PROPOFOL N/A 10/19/2021  ? Procedure: ESOPHAGOGASTRODUODENOSCOPY (EGD) WITH PROPOFOL;  Surgeon: Lin Landsman, MD;  Location: [facility] ENDOSCOPY;  Service: Gastroenterology;  Laterality: N/A;  ? FOREIGN BODY REMOVAL  12/31/2020  ? Procedure: FOREIGN BODY REMOVAL;  Surgeon: Ronnette Juniper, MD;  Location: [place];  Service: Gastroenterology;;  ? LOWER EXTREMITY ANGIOGRAM Left 05/27/2018  ? PERIPHERAL VASCULAR INTERVENTION Left 05/27/2018  ? Procedure: PERIPHERAL VASCULAR INTERVENTION;  Surgeon: Waynetta Sandy, MD;  Location: [place] [facility];  Service: Cardiovascular;  Laterality: Left;  ? POLYPECTOMY  04/04/2018  ? Procedure: POLYPECTOMY;  Surgeon: [name], MD;  Location: [facility] ENDOSCOPY;  Service: Endoscopy;;  ? POLYPECTOMY  10/27/2020  ? Procedure: POLYPECTOMY;  Surgeon: Arta Silence, MD;  Location: [facility];  Service: Endoscopy;;  ? TRANSMETATARSAL AMPUTATION Left 05/28/2018  ? Procedure: AMPUTATION TOES THREE, FOUR AND FIVE on left foot;  Surgeon: Waynetta Sandy, MD;  Location: [place];  Service: Vascular;  Laterality: Left;  ? WOUND DEBRIDEMENT Left 06/06/2018  ? Procedure: DEBRIDEMENT WOUND LEFT FOOT;  Surgeon: Waynetta Sandy, MD;  Location: [place];  Service: Vascular;  Laterality: Left;  ? ? ?Current Outpatient Medications:  ?  ACCU-CHEK GUIDE test strip, 2 (two) times daily. as directed, Disp: , Rfl:  ?  Accu-Chek Softclix Lancets lancets,  2 (two) times daily., Disp: , Rfl:  ?  albuterol (PROVENTIL) (2.5 MG/3ML) 0.083% nebulizer solution, Take 3 mLs (2.5 mg total) by nebulization every 4 (four) hours as needed for wheezing or shortness of breath., Disp: 75 mL, Rfl: 0 ?  amLODipine (NORVASC) 10 MG tablet, Take 1 tablet (10 mg total) by mouth daily., Disp: 30 tablet, Rfl: 0 ?  aspirin EC 81 MG EC tablet, Take 1 tablet (81 mg total) by mouth daily., Disp: , Rfl:  ?   atorvastatin (LIPITOR) 40 MG tablet, Take 2 tablets (80 mg total) by mouth daily at 6 PM., Disp: , Rfl:  ?  Blood Glucose Monitoring Suppl (ACCU-CHEK GUIDE) w/Device KIT, USE AS DIRECTED TO CHECK BLOOD SUGAR, Disp:

## 2021-12-26 NOTE — Telephone Encounter (Signed)
Called again and left 2 messages for [place] and asked them to call and I would hold. She states she said she would call the driver now and tell him to come pick her up. She states she did not give her a call when they would be there though  ?

## 2021-12-29 ENCOUNTER — Ambulatory Visit (HOSPITAL_COMMUNITY): Payer: Medicare Other | Attending: Cardiology

## 2021-12-29 DIAGNOSIS — I351 Nonrheumatic aortic (valve) insufficiency: Secondary | ICD-10-CM | POA: Insufficient documentation

## 2021-12-29 LAB — ECHOCARDIOGRAM COMPLETE
Area-P 1/2: 3.34 cm2
P 1/2 time: 506 msec
S' Lateral: 2.1 cm

## 2022-01-05 ENCOUNTER — Encounter: Payer: Self-pay | Admitting: Adult Health

## 2022-01-05 ENCOUNTER — Ambulatory Visit (INDEPENDENT_AMBULATORY_CARE_PROVIDER_SITE_OTHER): Payer: Medicare Other | Admitting: Adult Health

## 2022-01-05 VITALS — BP 159/98 | HR 70

## 2022-01-05 DIAGNOSIS — Z8673 Personal history of transient ischemic attack (TIA), and cerebral infarction without residual deficits: Secondary | ICD-10-CM

## 2022-01-05 DIAGNOSIS — I639 Cerebral infarction, unspecified: Secondary | ICD-10-CM

## 2022-01-05 DIAGNOSIS — G8114 Spastic hemiplegia affecting left nondominant side: Secondary | ICD-10-CM | POA: Diagnosis not present

## 2022-01-05 NOTE — Progress Notes (Signed)
?[facility] ?[address] ?[place]. [place] [address] ?[phone] ? ?     STROKE FOLLOW UP NOTE ? ?[name] ?Date of Birth:  04/03/1971 ?Medical Record Number:  [id number]  ? ?Reason for Referral: stroke follow up ? ? ? ?SUBJECTIVE: ? ? ?CHIEF COMPLAINT:  ?Chief Complaint  ?Patient presents with  ? Follow-up  ?  RM 3 alone  ?Pt is well and stable, still having L sided residual deficits. No new concerns   ? ? ? ?HPI:  ? ?Diamond Rice is a 51 y.o. female who is being followed in this office for L CR stroke in 12/2020 secondary to small vessel disease as well as prior history of ischemic and ICH strokes with residual left hemiparesis and dysarthria.  Pertinent underlying history of HTN, HLD, DM, PAOD s/p LLE reconstruction, medication noncompliance, hx of PE, CKD, migraines and substance abuse ? ? ? ?Update 01/05/2022 JM: Patient returns for follow-up visit after prior visit 7 months ago.  She is unaccompanied today.  Continues to reside at [facility].  Has been stable since prior visit without new stroke/TIA symptoms.  Residual deficits stable without worsening. Working with OT at [facility] currently. She is nonambulatory, stand/pivots to w/c.  Denies any recent falls.  Per SNF MAR, remains on aspirin and atorvastatin.  Blood pressure today 159/98. Reports she has not yet taken her morning medications.  Routinely monitors at SNF and typically stable.  Reports routine lab work by facility which she reports have been good.  Reports compliance on CPAP.  No new concerns at this time. ? ? ? ? ? ?History provided for reference purposes only ?Update 06/09/2021 JM: Returns for 69-monthstroke follow-up unaccompanied.  [facility]  Overall stable without new stroke/TIA symptoms.  Residual left sided weakness with spasticity slightly worse from baseline chronic left hemiparesis - denies much improvement since prior visit. Recently restarted working  with therapies.  [facility] on aspirin and atorvastatin 40 mg daily. Blood pressure today initially 170/100 and on recheck 142/94.  Not recently monitored at SNF per patient. Glucose levels fluctuate per pt - managed by SNF. Labs routinely monitored by facility.  Continue nightly use of CPAP.  No new concerns at this time. ? ?Initial visit 03/03/2021 JM: Diamond Rice being seen for hospital follow-up unaccompanied.  [facility]  She is currently receiving therapies for continued increased left-sided weakness compared to baseline and dysarthria but does report continued improvement.  She reports improvement of right-sided weakness.  She is able to ambulate short distance during therapy sessions otherwise transfers via wheelchair.  [facility] and atorvastatin 40 mg daily.  Blood pressure today 137/85.  She reports blood pressures and glucose levels at facility have been stable but unable to provide levels. Per review of epic, [facility] well as hospital admission on 01/16/2021 -01/20/2021 with left facial weakness, right-sided numbness, slurred speech and expressive aphasia likely recrudescence of prior stroke with UTI with significant improvement after treatment for UTI with CT, CTA and MRI brain negative.  She has not had any additional or new stroke/TIA symptoms since that time.  No further concerns at this time. ? ?Stroke admission 12/27/2020 ?Ms. Diamond Gregoryis a 51y.o. female with history of PE (was on Xarelto - d/c'd after ICH), hypertension, PVD, diabetes,  HLD, obesity, migraine, COPD, CKD, history of stroke and ICH with residual left sided weakness admitted on 12/27/2020 for worsening slurred speech, and difficulty walking in setting of left CR infarct likely due to small vessel disease source in setting of uncontrolled risk factors including HTN and HLD.  CTA  head/neck negative LVO.  EF 60 to 65%.  LDL 136.  A1c 6.6.  UDS positive for THC.  Advised continuation of aspirin as well as increase home dose atorvastatin from 40 mg to 80 mg daily.  DAPT not recommended due to history of ICH and uncontrolled HTN.  History of prior strokes 3 ischemic and 1 hemorrhagic with chronic left-sided deficits from hemorrhagic stroke with most recent ischemic stroke 11/2019.  Initially evaluated by therapies and recommended CIR but deemed not appropriate as she was homeless and eventually discharged to SNF. Episode of worsening of speech and all extremities weaker than baseline on 5/5 with MRI negative for acute change and likely in setting of recrudescence of old strokes in setting of anxiety and depression versus conversion disorder. ? ? ? ? ? ?ROS:   ?14 system review of systems performed and negative with exception of those listed in HPI ? ?PMH:  ?Past Medical History:  ?Diagnosis Date  ? Acute ischemic stroke ([place]) 12/27/2020  ? Acute renal failure (ARF) ([place]) 04/02/2018  ? Anxiety   ? Chronic lower back pain 04/17/2012  ? "just got over some; catched when I walked"  ? COPD (chronic obstructive pulmonary disease) ([place])   ? Critical lower limb ischemia ([place]) 05/27/2018  ? Depression   ? Headache(784.0) 04/17/2012  ? "~ qod; lately waking up in am w/one"  ? High cholesterol   ? Hypertension   ? Migraines 04/17/2012  ? Obesity   ? Sleep apnea   ? CPAP  ? Stroke [facility])   ? Type II diabetes mellitus ([place]) 08/28/2002  ? ? ?PSH:  ?Past Surgical History:  ?Procedure Laterality Date  ? ABDOMINAL AORTOGRAM W/LOWER EXTREMITY Left 05/27/2018  ? Procedure: ABDOMINAL AORTOGRAM W/LOWER EXTREMITY Runoff and Possible Intervention;  Surgeon: Waynetta Sandy, MD;  Location: [place] [facility];  Service: Cardiovascular;  Laterality: Left;  ? APPLICATION OF WOUND VAC Left 06/06/2018  ? Procedure: APPLICATION OF WOUND VAC;  Surgeon: Waynetta Sandy, MD;  Location: [place];  Service: Vascular;   Laterality: Left;  ? BALLOON DILATION N/A 12/31/2020  ? Procedure: BALLOON DILATION;  Surgeon: Ronnette Juniper, MD;  Location: [place];  Service: Gastroenterology;  Laterality: N/A;  ? BIOPSY  04/04/2018  ? Procedure: BIOPSY;  Surgeon: [name], MD;  Location: [facility] ENDOSCOPY;  Service: Endoscopy;;  ? BIOPSY  10/27/2020  ? Procedure: BIOPSY;  Surgeon: Arta Silence, MD;  Location: [facility] ENDOSCOPY;  Service: Endoscopy;;  ? BOTOX INJECTION N/A 12/31/2020  ? Procedure: BOTOX INJECTION;  Surgeon: Ronnette Juniper, MD;  Location: [place];  Service: Gastroenterology;  Laterality: N/A;  ? CARDIAC CATHETERIZATION  ~ 2011  ? COLONOSCOPY N/A 04/04/2018  ? Procedure: COLONOSCOPY;  Surgeon: [name], MD;  Location: [facility] ENDOSCOPY;  Service: Endoscopy;  Laterality: N/A;  ? COLONOSCOPY WITH PROPOFOL N/A 10/27/2020  ? Procedure: COLONOSCOPY WITH PROPOFOL;  Surgeon: Arta Silence, MD;  Location: [facility]OSCOPY;  Service: Endoscopy;  Laterality: N/A;  ? ESOPHAGOGASTRODUODENOSCOPY N/A 12/31/2020  ? Procedure: ESOPHAGOGASTRODUODENOSCOPY (EGD);  Surgeon: Ronnette Juniper, MD;  Location: [place];  Service: Gastroenterology;  Laterality: N/A;  ? ESOPHAGOGASTRODUODENOSCOPY (EGD) WITH PROPOFOL N/A 10/27/2020  ? Procedure: ESOPHAGOGASTRODUODENOSCOPY (EGD) WITH PROPOFOL  WITH POSSIBLE  DIL;  Surgeon: Arta Silence, MD;  Location: [facility];  Service: Endoscopy;  Laterality: N/A;  ? ESOPHAGOGASTRODUODENOSCOPY (EGD) WITH PROPOFOL N/A 10/19/2021  ? Procedure: ESOPHAGOGASTRODUODENOSCOPY (EGD) WITH PROPOFOL;  Surgeon: Lin Landsman, MD;  Location: [facility] ENDOSCOPY;  Service: Gastroenterology;  Laterality: N/A;  ? FOREIGN BODY REMOVAL  12/31/2020  ? Procedure: FOREIGN BODY REMOVAL;  Surgeon: Ronnette Juniper, MD;  Location: [place];  Service: Gastroenterology;;  ? LOWER EXTREMITY ANGIOGRAM Left 05/27/2018  ? PERIPHERAL VASCULAR INTERVENTION Left 05/27/2018  ? Procedure: PERIPHERAL VASCULAR INTERVENTION;  Surgeon: Waynetta Sandy, MD;  Location:  [place] [facility];  Service: Cardiovascular;  Laterality: Left;  ? POLYPECTOMY  04/04/2018  ? Procedure: POLYPECTOMY;  Surgeon: [name], MD;  Location: [facility] ENDOSCOPY;  Service: Endoscopy;;  ? POLYPECTOMY

## 2022-01-05 NOTE — Patient Instructions (Addendum)
Continue aspirin 81 mg daily  and atorvastatin for secondary stroke prevention ? ?Ensure continued nightly use of CPAP for OSA management ? ?Continue to follow up with PCP/SNF regarding cholesterol, blood pressure and diabetes management  ?Maintain strict control of hypertension with blood pressure goal below 130/90, diabetes with hemoglobin A1c goal below 7.0 % and cholesterol with LDL cholesterol (bad cholesterol) goal below 70 mg/dL.  ? ?Signs of a Stroke? Follow the BEFAST method:  ?Balance Watch for a sudden loss of balance, trouble with coordination or vertigo ?Eyes Is there a sudden loss of vision in one or both eyes? Or double vision?  ?Face: Ask the person to smile. Does one side of the face droop or is it numb?  ?Arms: Ask the person to raise both arms. Does one arm drift downward? Is there weakness or numbness of a leg? ?Speech: Ask the person to repeat a simple phrase. Does the speech sound slurred/strange? Is the person confused ? ?Time: If you observe any of these signs, call 911. ? ? ?Without further recommendations further recommendations ?As you have been stable from stroke standpoint over the past year and risk factors being managed by PCP/SNF, can follow-up on an as-needed basis. ? ? ? ? ?Thank you for coming to see [place] at [facility]. I hope we have been able to provide you high quality care today. ? ?You may receive a patient satisfaction survey over the next few weeks. We would appreciate your feedback and comments so that we may continue to improve ourselves and the health of our patients. ? ?

## 2022-01-29 NOTE — Progress Notes (Unsigned)
Cardiology Office Note   Date:  01/30/2022   ID:  Diamond Rice, DOB [birth year], MRN [id number]  PCP:  System, Provider Not In  Cardiologist:   Rollene Rotunda, MD   Chief Complaint  Patient presents with   Aortic insufficiency       History of Present Illness: Diamond Rice is a 51 y.o. female who presents for evaluation of LVH.  The patient was in the hospital with pulmonary embolism following toe amputation.  Her EF is found to be 65 to 70%.  She is noted to have mild to moderate aortic insufficiency.  There was a mention in her notes of having a previous MI but I do not see a record of this.  I do see that she had a previous perfusion study in 2013 without ischemia or infarct.   She reports that there was a heart catheterization some years ago when I see mention in 08/2009.  She said there was some small vessel disease.  Other than that there is not been any significant cardiac history.  She is had difficult to control hypertension with a strong family history of this.  She had a stroke with left-sided weakness.   I sent her for an echo.  This suggested moderate AI with moderate concentric LVH.  She comes today for follow-up.  She gets around in a wheelchair.  [facility] at [facility].  Because of left hemiparesis and her previous stroke she can only stand and pivot.  She can propel herself forward with her arm and leg from the right.  With this level of activity she denies any chest pressure, neck or arm discomfort.  She has no new shortness of breath, PND or orthopnea.  She has no palpitations, presyncope or syncope.  She sleeps in a hospital bed with her head slightly elevated but this has been chronic.  She does use CPAP.   Past Medical History:  Diagnosis Date   Acute ischemic stroke (HCC) 12/27/2020   Acute renal failure (ARF) (HCC) 04/02/2018   Anxiety    Chronic lower back pain 04/17/2012   "just got over some; catched when I walked"   COPD (chronic  obstructive pulmonary disease) (HCC)    Critical lower limb ischemia (HCC) 05/27/2018   Depression    Headache(784.0) 04/17/2012   "~ qod; lately waking up in am w/one"   High cholesterol    Hypertension    Migraines 04/17/2012   Obesity    Sleep apnea    CPAP   Stroke (HCC)    Type II diabetes mellitus (HCC) 08/28/2002    Past Surgical History:  Procedure Laterality Date   ABDOMINAL AORTOGRAM W/LOWER EXTREMITY Left 05/27/2018   Procedure: ABDOMINAL AORTOGRAM W/LOWER EXTREMITY Runoff and Possible Intervention;  Surgeon: Maeola Harman, MD;  Location: [facility] INVASIVE CV LAB;  Service: Cardiovascular;  Laterality: Left;   APPLICATION OF WOUND VAC Left 06/06/2018   Procedure: APPLICATION OF WOUND VAC;  Surgeon: Maeola Harman, MD;  Location: [facility] OR;  Service: Vascular;  Laterality: Left;   BALLOON DILATION N/A 12/31/2020   Procedure: [place];  Surgeon: Kerin Salen, MD;  Location: [facility] ENDOSCOPY;  Service: Gastroenterology;  Laterality: N/A;   BIOPSY  04/04/2018   Procedure: BIOPSY;  Surgeon: Lynann Bologna, MD;  Location: [facility] ENDOSCOPY;  Service: Endoscopy;;   BIOPSY  10/27/2020   Procedure: BIOPSY;  Surgeon: Willis Modena, MD;  Location: [facility] ENDOSCOPY;  Service: Endoscopy;;   BOTOX INJECTION N/A 12/31/2020  Procedure: BOTOX INJECTION;  Surgeon: Ronnette Juniper, MD;  Location: [place];  Service: Gastroenterology;  Laterality: N/A;   CARDIAC CATHETERIZATION  ~ 2011   COLONOSCOPY N/A 04/04/2018   Procedure: COLONOSCOPY;  Surgeon: [name], MD;  Location[facility] ENDOSCOPY;  Service: Endoscopy;  Laterality: N/A;   COLONOSCOPY WITH PROPOFOL N/A 10/27/2020   Procedure: COLONOSCOPY WITH PROPOFOL;  Surgeon: Arta Silence, MD;  Location: [facility] ENDOSCOPY;  Service: Endoscopy;  Laterality: N/A;   ESOPHAGOGASTRODUODENOSCOPY N/A 12/31/2020   Procedure: ESOPHAGOGASTRODUODENOSCOPY (EGD);  Surgeon: Ronnette Juniper, MD;  Location: [place];  Service: Gastroenterology;  Laterality: N/A;    ESOPHAGOGASTRODUODENOSCOPY (EGD) WITH PROPOFOL N/A 10/27/2020   Procedure: ESOPHAGOGASTRODUODENOSCOPY (EGD) WITH PROPOFOL  WITH POSSIBLE DIL;  Surgeon: Arta Silence, MD;  Location: [facility] ENDOSCOPY;  Service: Endoscopy;  Laterality: N/A;   ESOPHAGOGASTRODUODENOSCOPY (EGD) WITH PROPOFOL N/A 10/19/2021   Procedure: ESOPHAGOGASTRODUODENOSCOPY (EGD) WITH PROPOFOL;  Surgeon: Lin Landsman, MD;  Location: [place];  Service: Gastroenterology;  Laterality: N/A;   FOREIGN BODY REMOVAL  12/31/2020   Procedure: FOREIGN BODY REMOVAL;  Surgeon: Ronnette Juniper, MD;  Location: [facility] ENDOSCOPY;  Service: Gastroenterology;;   LOWER EXTREMITY ANGIOGRAM Left 05/27/2018   PERIPHERAL VASCULAR INTERVENTION Left 05/27/2018   Procedure: PERIPHERAL VASCULAR INTERVENTION;  Surgeon: Waynetta Sandy, MD;  Location: [place] CV LAB;  Service: Cardiovascular;  Laterality: Left;   POLYPECTOMY  04/04/2018   Procedure: POLYPECTOMY;  Surgeon: [name], MD;  Location: [facility] ENDOSCOPY;  Service: Endoscopy;;   POLYPECTOMY  10/27/2020   Procedure: POLYPECTOMY;  Surgeon: Arta Silence, MD;  Location: [facility] ENDOSCOPY;  Service: Endoscopy;;   TRANSMETATARSAL AMPUTATION Left 05/28/2018   Procedure: AMPUTATION TOES THREE, FOUR AND FIVE on left foot;  Surgeon: Waynetta Sandy, MD;  Location: [place];  Service: Vascular;  Laterality: Left;   WOUND DEBRIDEMENT Left 06/06/2018   Procedure: DEBRIDEMENT WOUND LEFT FOOT;  Surgeon: Waynetta Sandy, MD;  Location: [place];  Service: Vascular;  Laterality: Left;     Current Outpatient Medications  Medication Sig Dispense Refill   ACCU-CHEK GUIDE test strip 2 (two) times daily. as directed     Accu-Chek Softclix Lancets lancets 2 (two) times daily.     albuterol (PROVENTIL) (2.5 MG/3ML) 0.083% nebulizer solution Take 3 mLs (2.5 mg total) by nebulization every 4 (four) hours as needed for wheezing or shortness of breath. 75 mL 0   amLODipine (NORVASC) 10 MG tablet Take 1  tablet (10 mg total) by mouth daily. 30 tablet 0   aspirin EC 81 MG EC tablet Take 1 tablet (81 mg total) by mouth daily.     atorvastatin (LIPITOR) 40 MG tablet Take 2 tablets (80 mg total) by mouth daily at 6 PM.     Blood Glucose Monitoring Suppl (ACCU-CHEK GUIDE) w/Device KIT USE AS DIRECTED TO CHECK BLOOD SUGAR     carboxymethylcellulose (REFRESH PLUS) 0.5 % SOLN 1 drop at bedtime.     carvedilol (COREG) 12.5 MG tablet Take 12.5 mg by mouth 2 (two) times daily with a meal.     cloNIDine (CATAPRES - DOSED IN MG/24 HR) 0.3 mg/24hr patch 0.3 mg once a week.     famotidine (PEPCID) 40 MG tablet Take 40 mg by mouth daily.     FLUoxetine (PROZAC) 20 MG capsule Take 20 mg by mouth daily.     hydrALAZINE (APRESOLINE) 100 MG tablet Take 1 tablet (100 mg total) by mouth 3 (three) times daily. 90 tablet 0   insulin aspart (NOVOLOG) 100 UNIT/ML injection Inject  0-9 Units into the skin 3 (three) times daily with meals. Correction coverage: Sensitive (thin, NPO, renal) CBG < 70: Implement Hypoglycemia protocol. CBG 70 - 120: 0 units CBG 121 - 150: 1 unit CBG 151 - 200: 2 units CBG 201 - 250: 3 units CBG 251 - 300: 5 units CBG 301 - 350: 7 units CBG 351 - 400: 9 units CBG > 400: call MD.     insulin glargine (LANTUS) 100 UNIT/ML injection Inject 0.1 mLs (10 Units total) into the skin daily. 10 mL 0   Insulin lispro (HUMALOG JUNIOR KWIKPEN) 100 UNIT/ML Inject into the skin. Sliding scale     lisinopril (ZESTRIL) 40 MG tablet Take 1 tablet (40 mg total) by mouth daily. 30 tablet 0   magnesium hydroxide (MILK OF MAGNESIA) 400 MG/5ML suspension Take by mouth daily as needed for mild constipation.     Melatonin 10 MG CAPS Take by mouth.     mirtazapine (REMERON) 30 MG tablet Take 30 mg by mouth at bedtime.     senna (SENOKOT) 8.6 MG tablet Take 1 tablet by mouth daily.     traMADol (ULTRAM) 50 MG tablet Take by mouth every 6 (six) hours as needed.     TRULICITY 6.71 IW/5.8KD SOPN Inject into the skin.      No current facility-administered medications for this visit.    Allergies:   Morphine and related and Metformin   ROS:  Please see the history of present illness.   Otherwise, review of systems are positive for none.   All other systems are reviewed and negative.    PHYSICAL EXAM: VS:  BP 125/83   Pulse 70   Ht $R'5\' 1"'rh$  (1.549 m)   Wt 240 lb (108.9 kg)   LMP 04/26/2018 (Approximate)   SpO2 99%   BMI 45.35 kg/m  , BMI Body mass index is 45.35 kg/m. GENERAL:  Well appearing NECK:  No jugular venous distention, waveform within normal limits, carotid upstroke brisk and symmetric, no bruits, no thyromegaly LUNGS:  Clear to auscultation bilaterally CHEST:  Unremarkable HEART:  PMI not displaced or sustained,S1 and S2 within normal limits, no S3, no S4, no clicks, no rubs, soft diastolic murmur heard at the third left intercostal space, no systolic murmurs ABD:  Flat, positive bowel sounds normal in frequency in pitch, no bruits, no rebound, no guarding, no midline pulsatile mass, no hepatomegaly, no splenomegaly EXT:  2 plus pulses throughout, no edema, no cyanosis no clubbing   EKG:  EKG is  ordered today. Normal sinus rhythm, rate 69, left axis deviation, first-degree AV block, poor anterior R wave progression, no acute ST-T wave changes.  Recent Labs: 07/05/2021: ALT 21 07/29/2021: B Natriuretic Peptide 26.8; BUN 16; Creatinine, Ser 1.10; Hemoglobin 11.9; Platelets 331; Potassium 3.3; Sodium 139    Lipid Panel    Component Value Date/Time   CHOL 143 01/16/2021 2316   TRIG 57 01/16/2021 2316   HDL 72 01/16/2021 2316   CHOLHDL 2.0 01/16/2021 2316   VLDL 11 01/16/2021 2316   LDLCALC 60 01/16/2021 2316      Wt Readings from Last 3 Encounters:  01/30/22 240 lb (108.9 kg)  12/26/21 230 lb (104.3 kg)  10/19/21 220 lb 7.4 oz (100 kg)      Other studies Reviewed: Additional studies/ records that were reviewed today include: Echocardiogram Review of the above records  demonstrates:  Please see elsewhere in the note.     ASSESSMENT AND PLAN:  Chest pain and abnormal  EKG:   The patient is having no further chest pain.  No further work-up.  AI:   Her AI appears to be moderate.  Any surgical intervention for this would be high risk given her previous stroke.  This seems to be something we can follow and I will follow-up with an echocardiogram in 1 year.  She is currently asymptomatic and has normal size and LV function.   LVH: This is likely related to hypertension and her blood pressure is currently well controlled.  This will be followed up with echo as above.  Hypertension:   BP is is well controlled on the current meds.  No change in therapy.   Sleep apnea:   She is using CPAP.   DM:   I do not see a recent A1c and will defer to her primary providers.  Risk reduction: LDL in May of last year was 14 with an HDL of 72.  No change in therapy.  She should have follow-up lipids at her primary provider this year and I be happy to review these.     Current medicines are reviewed at length with the patient today.  The patient does not have concerns regarding medicines.  The following changes have been made: None  Labs/ tests ordered today include:   Orders Placed This Encounter  Procedures   EKG 12-Lead   ECHOCARDIOGRAM COMPLETE      Disposition:   FU with APP in one year.     Signed, Minus Breeding, MD  01/30/2022 12:51 PM    [facility]

## 2022-01-30 ENCOUNTER — Ambulatory Visit (INDEPENDENT_AMBULATORY_CARE_PROVIDER_SITE_OTHER): Payer: Medicare Other | Admitting: Cardiology

## 2022-01-30 ENCOUNTER — Encounter: Payer: Self-pay | Admitting: Cardiology

## 2022-01-30 VITALS — BP 125/83 | HR 70 | Ht 61.0 in | Wt 240.0 lb

## 2022-01-30 DIAGNOSIS — I10 Essential (primary) hypertension: Secondary | ICD-10-CM | POA: Diagnosis not present

## 2022-01-30 DIAGNOSIS — I351 Nonrheumatic aortic (valve) insufficiency: Secondary | ICD-10-CM | POA: Diagnosis not present

## 2022-01-30 DIAGNOSIS — I517 Cardiomegaly: Secondary | ICD-10-CM | POA: Diagnosis not present

## 2022-01-30 DIAGNOSIS — R072 Precordial pain: Secondary | ICD-10-CM | POA: Diagnosis not present

## 2022-01-30 NOTE — Patient Instructions (Addendum)
Medication Instructions:  Your Physician recommend you continue on your current medication as directed.    *If you need a refill on your cardiac medications before your next appointment, please call your pharmacy*   Lab Work: None ordered today   Testing/Procedures: Your physician has requested that you have an echocardiogram in May, 2024. Echocardiography is a painless test that uses sound waves to create images of your heart. It provides your doctor with information about the size and shape of your heart and how well your heart's chambers and valves are working. This procedure takes approximately one hour. There are no restrictions for this procedure. [facility] [address]    Follow-Up: At [facility], you and your health needs are our priority.  As part of our continuing mission to provide you with exceptional heart care, we have created designated Provider Care Teams.  These Care Teams include your primary Cardiologist (physician) and Advanced Practice Providers (APPs -  Physician Assistants and Nurse Practitioners) who all work together to provide you with the care you need, when you need it.  We recommend signing up for the patient portal called "MyChart".  Sign up information is provided on this After Visit Summary.  MyChart is used to connect with patients for Virtual Visits (Telemedicine).  Patients are able to view lab/test results, encounter notes, upcoming appointments, etc.  Non-urgent messages can be sent to your provider as well.   To learn more about what you can do with MyChart, go to [email or web address].    Your next appointment:   1 year(s)  The format for your next appointment:   In Person  Provider:   Minus Breeding, MD {

## 2022-02-18 ENCOUNTER — Emergency Department: Payer: Medicare Other

## 2022-02-18 ENCOUNTER — Emergency Department
Admission: EM | Admit: 2022-02-18 | Discharge: 2022-02-18 | Disposition: A | Discharge status: Skilled Nursing Facility | Payer: Medicare Other | Attending: Emergency Medicine | Admitting: Emergency Medicine

## 2022-02-18 ENCOUNTER — Other Ambulatory Visit: Payer: Self-pay

## 2022-02-18 DIAGNOSIS — I10 Essential (primary) hypertension: Secondary | ICD-10-CM | POA: Diagnosis not present

## 2022-02-18 DIAGNOSIS — R1012 Left upper quadrant pain: Secondary | ICD-10-CM | POA: Insufficient documentation

## 2022-02-18 DIAGNOSIS — R109 Unspecified abdominal pain: Secondary | ICD-10-CM | POA: Diagnosis present

## 2022-02-18 DIAGNOSIS — E119 Type 2 diabetes mellitus without complications: Secondary | ICD-10-CM | POA: Diagnosis not present

## 2022-02-18 LAB — COMPREHENSIVE METABOLIC PANEL
ALT: 13 U/L (ref 0–44)
AST: 12 U/L — ABNORMAL LOW (ref 15–41)
Albumin: 3.4 g/dL — ABNORMAL LOW (ref 3.5–5.0)
Alkaline Phosphatase: 127 U/L — ABNORMAL HIGH (ref 38–126)
Anion gap: 4 — ABNORMAL LOW (ref 5–15)
BUN: 12 mg/dL (ref 6–20)
CO2: 28 mmol/L (ref 22–32)
Calcium: 9.4 mg/dL (ref 8.9–10.3)
Chloride: 105 mmol/L (ref 98–111)
Creatinine, Ser: 1.02 mg/dL — ABNORMAL HIGH (ref 0.44–1.00)
GFR, Estimated: >60 mL/min (ref >60)
Glucose, Bld: 206 mg/dL — ABNORMAL HIGH (ref 70–99)
Potassium: 4 mmol/L (ref 3.5–5.1)
Sodium: 137 mmol/L (ref 135–145)
Total Bilirubin: 0.1 mg/dL — ABNORMAL LOW (ref 0.3–1.2)
Total Protein: 8.4 g/dL — ABNORMAL HIGH (ref 6.5–8.1)

## 2022-02-18 LAB — LIPASE, BLOOD: Lipase: 35 U/L (ref 11–51)

## 2022-02-18 LAB — URINALYSIS, ROUTINE W REFLEX MICROSCOPIC
Bilirubin Urine: NEGATIVE
Glucose, UA: 50 mg/dL — AB
Hgb urine dipstick: NEGATIVE
Ketones, ur: NEGATIVE mg/dL
Leukocytes,Ua: NEGATIVE
Nitrite: NEGATIVE
Protein, ur: NEGATIVE mg/dL
Specific Gravity, Urine: 1.031 — ABNORMAL HIGH (ref 1.005–1.030)
pH: 6 (ref 5.0–8.0)

## 2022-02-18 LAB — CBC
HCT: 37.6 % (ref 36.0–46.0)
Hemoglobin: 11.3 g/dL — ABNORMAL LOW (ref 12.0–15.0)
MCH: 21.4 pg — ABNORMAL LOW (ref 26.0–34.0)
MCHC: 30.1 g/dL (ref 30.0–36.0)
MCV: 71.2 fL — ABNORMAL LOW (ref 80.0–100.0)
Platelets: 328 10*3/uL (ref 150–400)
RBC: 5.28 MIL/uL — ABNORMAL HIGH (ref 3.87–5.11)
RDW: 17.5 % — ABNORMAL HIGH (ref 11.5–15.5)
WBC: 6.5 10*3/uL (ref 4.0–10.5)
nRBC: 0 % (ref 0.0–0.2)

## 2022-02-18 MED ORDER — ONDANSETRON HCL 4 MG/2ML IJ SOLN
4.0000 mg | Freq: Once | INTRAMUSCULAR | Status: AC
  Administered 2022-02-18: 4 mg via INTRAVENOUS
  Filled 2022-02-18: qty 2

## 2022-02-18 MED ORDER — LIDOCAINE VISCOUS HCL 2 % MT SOLN
15.0000 mL | Freq: Once | OROMUCOSAL | Status: AC
  Administered 2022-02-18: 15 mL via ORAL
  Filled 2022-02-18: qty 15

## 2022-02-18 MED ORDER — PANTOPRAZOLE SODIUM 40 MG PO TBEC: 40.0000 mg | DELAYED_RELEASE_TABLET | Freq: Every day | ORAL | 0 refills | Status: DC

## 2022-02-18 MED ORDER — ALUM & MAG HYDROXIDE-SIMETH 200-200-20 MG/5ML PO SUSP
30.0000 mL | Freq: Once | ORAL | Status: AC
  Administered 2022-02-18: 30 mL via ORAL
  Filled 2022-02-18: qty 30

## 2022-02-18 MED ORDER — IOHEXOL 350 MG/ML SOLN
75.0000 mL | Freq: Once | INTRAVENOUS | Status: AC | PRN
  Administered 2022-02-18: 75 mL via INTRAVENOUS

## 2022-02-18 MED ORDER — FENTANYL CITRATE PF 50 MCG/ML IJ SOSY
50.0000 ug | PREFILLED_SYRINGE | Freq: Once | INTRAMUSCULAR | Status: AC
  Administered 2022-02-18: 50 ug via INTRAVENOUS
  Filled 2022-02-18: qty 1

## 2022-02-18 MED ORDER — SODIUM CHLORIDE 0.9 % IV BOLUS
1000.0000 mL | Freq: Once | INTRAVENOUS | Status: AC
  Administered 2022-02-18: 1000 mL via INTRAVENOUS

## 2022-02-18 MED ORDER — SUCRALFATE 1 G PO TABS: 1.0000 g | ORAL_TABLET | Freq: Three times a day (TID) | ORAL | 0 refills | Status: DC

## 2022-02-18 NOTE — ED Triage Notes (Signed)
Pt arrives via EMS from [facility] for 10/10 LUQ abd pain- pt has had n/v but denies diarrhea and fever- per staff at [facility] care they checked her for an impaction and there was not one- pt has a hx of stroke that affects her L side

## 2022-02-23 ENCOUNTER — Emergency Department
Admission: EM | Admit: 2022-02-23 | Discharge: 2022-02-23 | Disposition: A | Discharge status: Home / Self Care | Payer: Medicare Other | Attending: Emergency Medicine | Admitting: Emergency Medicine

## 2022-02-23 ENCOUNTER — Emergency Department: Payer: Medicare Other

## 2022-02-23 DIAGNOSIS — Z79899 Other long term (current) drug therapy: Secondary | ICD-10-CM | POA: Insufficient documentation

## 2022-02-23 DIAGNOSIS — K219 Gastro-esophageal reflux disease without esophagitis: Secondary | ICD-10-CM | POA: Insufficient documentation

## 2022-02-23 DIAGNOSIS — R4701 Aphasia: Secondary | ICD-10-CM | POA: Insufficient documentation

## 2022-02-23 DIAGNOSIS — I10 Essential (primary) hypertension: Secondary | ICD-10-CM | POA: Insufficient documentation

## 2022-02-23 DIAGNOSIS — Z794 Long term (current) use of insulin: Secondary | ICD-10-CM | POA: Diagnosis not present

## 2022-02-23 DIAGNOSIS — E119 Type 2 diabetes mellitus without complications: Secondary | ICD-10-CM | POA: Diagnosis not present

## 2022-02-23 DIAGNOSIS — Z7982 Long term (current) use of aspirin: Secondary | ICD-10-CM | POA: Diagnosis not present

## 2022-02-23 DIAGNOSIS — J449 Chronic obstructive pulmonary disease, unspecified: Secondary | ICD-10-CM | POA: Insufficient documentation

## 2022-02-23 DIAGNOSIS — R112 Nausea with vomiting, unspecified: Secondary | ICD-10-CM

## 2022-02-23 LAB — COMPREHENSIVE METABOLIC PANEL
ALT: 11 U/L (ref 0–44)
AST: 11 U/L — ABNORMAL LOW (ref 15–41)
Albumin: 3 g/dL — ABNORMAL LOW (ref 3.5–5.0)
Alkaline Phosphatase: 100 U/L (ref 38–126)
Anion gap: 4 — ABNORMAL LOW (ref 5–15)
BUN: 13 mg/dL (ref 6–20)
CO2: 29 mmol/L (ref 22–32)
Calcium: 8.6 mg/dL — ABNORMAL LOW (ref 8.9–10.3)
Chloride: 107 mmol/L (ref 98–111)
Creatinine, Ser: 1.03 mg/dL — ABNORMAL HIGH (ref 0.44–1.00)
GFR, Estimated: >60 mL/min (ref >60)
Glucose, Bld: 147 mg/dL — ABNORMAL HIGH (ref 70–99)
Potassium: 3.2 mmol/L — ABNORMAL LOW (ref 3.5–5.1)
Sodium: 140 mmol/L (ref 135–145)
Total Bilirubin: 0.3 mg/dL (ref 0.3–1.2)
Total Protein: 7.6 g/dL (ref 6.5–8.1)

## 2022-02-23 LAB — CBC WITH DIFFERENTIAL/PLATELET
Abs Immature Granulocytes: 0.04 10*3/uL (ref 0.00–0.07)
Basophils Absolute: 0 10*3/uL (ref 0.0–0.1)
Basophils Relative: 1 %
Eosinophils Absolute: 0.4 10*3/uL (ref 0.0–0.5)
Eosinophils Relative: 6 %
HCT: 32.7 % — ABNORMAL LOW (ref 36.0–46.0)
Hemoglobin: 9.6 g/dL — ABNORMAL LOW (ref 12.0–15.0)
Immature Granulocytes: 1 %
Lymphocytes Relative: 37 %
Lymphs Abs: 2.8 10*3/uL (ref 0.7–4.0)
MCH: 21.1 pg — ABNORMAL LOW (ref 26.0–34.0)
MCHC: 29.4 g/dL — ABNORMAL LOW (ref 30.0–36.0)
MCV: 72 fL — ABNORMAL LOW (ref 80.0–100.0)
Monocytes Absolute: 0.8 10*3/uL (ref 0.1–1.0)
Monocytes Relative: 10 %
Neutro Abs: 3.4 10*3/uL (ref 1.7–7.7)
Neutrophils Relative %: 45 %
Platelets: 259 10*3/uL (ref 150–400)
RBC: 4.54 MIL/uL (ref 3.87–5.11)
RDW: 17.4 % — ABNORMAL HIGH (ref 11.5–15.5)
WBC: 7.5 10*3/uL (ref 4.0–10.5)
nRBC: 0 % (ref 0.0–0.2)

## 2022-02-23 LAB — TROPONIN I (HIGH SENSITIVITY): Troponin I (High Sensitivity): 2 ng/L (ref <18)

## 2022-02-23 LAB — LIPASE, BLOOD: Lipase: 36 U/L (ref 11–51)

## 2022-02-23 LAB — GROUP A STREP BY PCR: Group A Strep by PCR: NOT DETECTED

## 2022-02-23 MED ORDER — SODIUM CHLORIDE 0.9 % IV BOLUS (SEPSIS)
1000.0000 mL | Freq: Once | INTRAVENOUS | Status: AC
  Administered 2022-02-23: 1000 mL via INTRAVENOUS

## 2022-02-23 MED ORDER — ONDANSETRON HCL 4 MG/2ML IJ SOLN
4.0000 mg | Freq: Once | INTRAMUSCULAR | Status: AC
  Administered 2022-02-23: 4 mg via INTRAVENOUS
  Filled 2022-02-23: qty 2

## 2022-02-23 MED ORDER — DIPHENHYDRAMINE HCL 50 MG/ML IJ SOLN
25.0000 mg | Freq: Once | INTRAMUSCULAR | Status: AC
  Administered 2022-02-23: 25 mg via INTRAVENOUS
  Filled 2022-02-23: qty 1

## 2022-02-23 MED ORDER — ONDANSETRON 4 MG PO TBDP: 4.0000 mg | ORAL_TABLET | Freq: Four times a day (QID) | ORAL | 0 refills | Status: DC | PRN

## 2022-02-23 MED ORDER — IOHEXOL 300 MG/ML  SOLN
75.0000 mL | Freq: Once | INTRAMUSCULAR | Status: AC | PRN
  Administered 2022-02-23: 75 mL via INTRAVENOUS

## 2022-02-23 MED ORDER — PANTOPRAZOLE SODIUM 40 MG IV SOLR
40.0000 mg | Freq: Once | INTRAVENOUS | Status: AC
  Administered 2022-02-23: 40 mg via INTRAVENOUS
  Filled 2022-02-23: qty 10

## 2022-02-23 MED ORDER — DEXAMETHASONE SODIUM PHOSPHATE 10 MG/ML IJ SOLN
10.0000 mg | Freq: Once | INTRAMUSCULAR | Status: AC
  Administered 2022-02-23: 10 mg via INTRAVENOUS
  Filled 2022-02-23: qty 1

## 2022-02-23 NOTE — ED Triage Notes (Addendum)
Pt brought in per EMS from  [facility] for nausea and vomiting 1x after given Sucralfate at facility for GERD. Pt presents to ED AAOx2, respi even-unlabored, some aphasia at baseline, in nad

## 2022-02-23 NOTE — ED Provider Notes (Addendum)
No emergent  [facility] Provider Note    Event Date/Time   First MD Initiated Contact with Patient 02/23/22 0257     (approximate)   History   Nausea and Emesis   HPI  Diamond Rice is a 51 y.o. female with history of previous stroke, hypertension, diabetes, COPD who presents to the emergency department from her nursing facility with complaints of throat pain, vomiting.  Patient was seen in the emergency department on June 24 for abdominal pain had a CT scan that showed fluid-filled esophagus likely from reflux.  She denies any abdominal pain today.  No diarrhea.  No fever.  No chest pain or shortness of breath.  States her throat is hurting and she feels like it is swollen.  No fevers, cough.  No rash.  No lip or tongue swelling.  She was given Carafate at her nursing facility for the throat pain and then vomited twice.  Patient chronically has a hoarse voice and aphasia from her stroke.  She denies any diarrhea, dysuria, hematuria, vaginal bleeding or discharge.  Denies previous abdominal surgery.   History provided by patient and EMS.    Past Medical History:  Diagnosis Date   Acute ischemic stroke ([place]) 12/27/2020   Acute renal failure (ARF) ([place]) 04/02/2018   Anxiety    Chronic lower back pain 04/17/2012   "just got over some; catched when I walked"   COPD (chronic obstructive pulmonary disease) ([place])    Critical lower limb ischemia ([place]) 05/27/2018   Depression    Headache(784.0) 04/17/2012   "~ qod; lately waking up in am w/one"   High cholesterol    Hypertension    Migraines 04/17/2012   Obesity    Sleep apnea    CPAP   Stroke ([place])    Type II diabetes mellitus ([place]) 08/28/2002    Past Surgical History:  Procedure Laterality Date   ABDOMINAL AORTOGRAM W/LOWER EXTREMITY Left 05/27/2018   Procedure: ABDOMINAL AORTOGRAM W/LOWER EXTREMITY Run[place] and Possible Intervention;  Surgeon: Waynetta Sandy, MD;  Location: [place] [facility];   Service: Cardiovascular;  Laterality: Left;   APPLICATION OF WOUND VAC Left 06/06/2018   Procedure: APPLICATION OF WOUND VAC;  Surgeon: Waynetta Sandy, MD;  Location: [place];  Service: Vascular;  Laterality: Left;   BALLOON DILATION N/A 12/31/2020   Procedure: [place];  Surgeon: Ronnette Juniper, MD;  Location: [place];  Service: Gastroenterology;  Laterality: N/A;   BIOPSY  04/04/2018   Procedure: BIOPSY;  Surgeon: [name], MD;  Location: [facility] ENDOSCOPY;  Service: Endoscopy;;   BIOPSY  10/27/2020   Procedure: BIOPSY;  Surgeon: Arta Silence, MD;  Location: [facility] ENDOSCOPY;  Service: Endoscopy;;   BOTOX INJECTION N/A 12/31/2020   Procedure: BOTOX INJECTION;  Surgeon: Ronnette Juniper, MD;  Location: [place];  Service: Gastroenterology;  Laterality: N/A;   CARDIAC CATHETERIZATION  ~ 2011   COLONOSCOPY N/A 04/04/2018   Procedure: COLONOSCOPY;  Surgeon: [name], MD;  Location: [facility] ENDOSCOPY;  Service: Endoscopy;  Laterality: N/A;   COLONOSCOPY WITH PROPOFOL N/A 10/27/2020   Procedure: COLONOSCOPY WITH PROPOFOL;  Surgeon: Arta Silence, MD;  Location: [facility] ENDOSCOPY;  Service: Endoscopy;  Laterality: N/A;   ESOPHAGOGASTRODUODENOSCOPY N/A 12/31/2020   Procedure: ESOPHAGOGASTRODUODENOSCOPY (EGD);  Surgeon: Ronnette Juniper, MD;  Location: [place];  Service: Gastroenterology;  Laterality: N/A;   ESOPHAGOGASTRODUODENOSCOPY (EGD) WITH PROPOFOL N/A 10/27/2020   Procedure: ESOPHAGOGASTRODUODENOSCOPY (EGD) WITH PROPOFOL  WITH POSSIBLE DIL;  Surgeon: Arta Silence, MD;  Location: [facility];  Service: Endoscopy;  Laterality: N/A;   ESOPHAGOGASTRODUODENOSCOPY (EGD) WITH PROPOFOL N/A 10/19/2021   Procedure: ESOPHAGOGASTRODUODENOSCOPY (EGD) WITH PROPOFOL;  Surgeon: Lin Landsman, MD;  Location: [place];  Service: Gastroenterology;  Laterality: N/A;   FOREIGN BODY REMOVAL  12/31/2020   Procedure: FOREIGN BODY REMOVAL;  Surgeon: Ronnette Juniper, MD;  Location: [facility] ENDOSCOPY;  Service:  Gastroenterology;;   LOWER EXTREMITY ANGIOGRAM Left 05/27/2018   PERIPHERAL VASCULAR INTERVENTION Left 05/27/2018   Procedure: PERIPHERAL VASCULAR INTERVENTION;  Surgeon: Waynetta Sandy, MD;  Location: [place] [facility];  Service: Cardiovascular;  Laterality: Left;   POLYPECTOMY  04/04/2018   Procedure: POLYPECTOMY;  Surgeon: [name], MD;  Location: [facility] ENDOSCOPY;  Service: Endoscopy;;   POLYPECTOMY  10/27/2020   Procedure: POLYPECTOMY;  Surgeon: Arta Silence, MD;  Location: [facility] ENDOSCOPY;  Service: Endoscopy;;   TRANSMETATARSAL AMPUTATION Left 05/28/2018   Procedure: AMPUTATION TOES THREE, FOUR AND FIVE on left foot;  Surgeon: Waynetta Sandy, MD;  Location: [place];  Service: Vascular;  Laterality: Left;   WOUND DEBRIDEMENT Left 06/06/2018   Procedure: DEBRIDEMENT WOUND LEFT FOOT;  Surgeon: Waynetta Sandy, MD;  Location: [place];  Service: Vascular;  Laterality: Left;    MEDICATIONS:  Prior to Admission medications   Medication Sig Start Date End Date Taking? Authorizing Provider  ACCU-CHEK GUIDE test strip 2 (two) times daily. as directed 02/06/20   [provider]  Accu-Chek Softclix Lancets lancets 2 (two) times daily. 02/06/20   [provider]  albuterol (PROVENTIL) (2.5 MG/3ML) 0.083% nebulizer solution Take 3 mLs (2.5 mg total) by nebulization every 4 (four) hours as needed for wheezing or shortness of breath. 07/29/21 07/29/22  Duffy Bruce, MD  amLODipine (NORVASC) 10 MG tablet Take 1 tablet (10 mg total) by mouth daily. 01/22/20   Royal Hawthorn, NP  aspirin EC 81 MG EC tablet Take 1 tablet (81 mg total) by mouth daily. 09/21/18   Donzetta Starch, NP  atorvastatin (LIPITOR) 40 MG tablet Take 2 tablets (80 mg total) by mouth daily at 6 PM. 01/03/21   Hongalgi, Lenis Dickinson, MD  Blood Glucose Monitoring Suppl (ACCU-CHEK GUIDE) w/Device KIT USE AS DIRECTED TO CHECK BLOOD SUGAR 02/06/20   [provider]  carboxymethylcellulose (REFRESH  PLUS) 0.5 % SOLN 1 drop at bedtime.    [provider]  carvedilol (COREG) 12.5 MG tablet Take 12.5 mg by mouth 2 (two) times daily with a meal.    [provider]  cloNIDine (CATAPRES - DOSED IN MG/24 HR) 0.3 mg/24hr patch 0.3 mg once a week. 11/08/21   [provider]  famotidine (PEPCID) 40 MG tablet Take 40 mg by mouth daily. 12/15/21   [provider]  FLUoxetine (PROZAC) 20 MG capsule Take 20 mg by mouth daily. 12/26/21   [provider]  hydrALAZINE (APRESOLINE) 100 MG tablet Take 1 tablet (100 mg total) by mouth 3 (three) times daily. 01/22/20   Royal Hawthorn, NP  insulin aspart (NOVOLOG) 100 UNIT/ML injection Inject 0-9 Units into the skin 3 (three) times daily with meals. Correction coverage: Sensitive (thin, NPO, renal) CBG < 70: Implement Hypoglycemia protocol. CBG 70 - 120: 0 units CBG 121 - 150: 1 unit CBG 151 - 200: 2 units CBG 201 - 250: 3 units CBG 251 - 300: 5 units CBG 301 - 350: 7 units CBG 351 - 400: 9 units CBG > 400: call MD. 01/03/21   Modena Jansky, MD  insulin glargine (LANTUS) 100 UNIT/ML injection  Inject 0.1 mLs (10 Units total) into the skin daily. 01/21/21   Nolberto Hanlon, MD  Insulin lispro (HUMALOG JUNIOR KWIKPEN) 100 UNIT/ML Inject into the skin. Sliding scale    [provider]  lisinopril (ZESTRIL) 40 MG tablet Take 1 tablet (40 mg total) by mouth daily. 01/22/20   Royal Hawthorn, NP  magnesium hydroxide (MILK OF MAGNESIA) 400 MG/5ML suspension Take by mouth daily as needed for mild constipation.    [provider]  Melatonin 10 MG CAPS Take by mouth.    [provider]  mirtazapine (REMERON) 30 MG tablet Take 30 mg by mouth at bedtime.    [provider]  pantoprazole (PROTONIX) 40 MG tablet Take 1 tablet (40 mg total) by mouth daily for 14 days. 02/18/22 03/04/22  Duffy Bruce, MD  senna (SENOKOT) 8.6 MG tablet Take 1 tablet by mouth daily.    [provider]  sucralfate  (CARAFATE) 1 g tablet Take 1 tablet (1 g total) by mouth 4 (four) times daily -  with meals and at bedtime for 7 days. 02/18/22 02/25/22  Duffy Bruce, MD  traMADol (ULTRAM) 50 MG tablet Take by mouth every 6 (six) hours as needed.    [provider]  TRULICITY 5.91 MB/8.4YK SOPN Inject into the skin. 08/10/21   [provider]    Physical Exam   Triage Vital Signs: ED Triage Vitals  Enc Vitals Group     BP 02/23/22 0300 (!) 148/80     Pulse Rate 02/23/22 0300 72     Resp 02/23/22 0300 18     Temp 02/23/22 0300 98 F (36.7 C)     Temp Source 02/23/22 0300 Oral     SpO2 02/23/22 0300 99 %     Weight 02/23/22 0258 279 lb 15.8 oz (127 kg)     Height 02/23/22 0258 _0  (1.549 m)     Head Circumference --      Peak Flow --      Pain Score --      Pain Loc --      Pain Edu? --      Excl. in [place]? --     Most recent vital signs: Vitals:   02/23/22 0300 02/23/22 0500  BP: (!) 148/80 (!) 157/91  Pulse: 72 77  Resp: 18 19  Temp: 98 F (36.7 C)   SpO2: 99% 95%    CONSTITUTIONAL: Alert and oriented and responds appropriately to questions.  Obese, chronically ill-appearing. HEAD: Normocephalic, atraumatic EYES: Conjunctivae clear, pupils appear equal, sclera nonicteric ENT: normal nose; moist mucous membranes, hoarse voice which she reports is chronic.  No muffled phonation.  No angioedema.  No stridor or drooling.  Very difficult to assess her posterior oropharynx due to her limitations from her previous stroke.  She does seem to have some mild uvular swelling on exam but it appears midline.  I do not appreciate any significant tonsillar hypertrophy or exudate but tonsillar view limited due to patient's tongue. NECK: Supple, normal ROM, trachea midline, no thyromegaly, no cervical lymphadenopathy CARD: RRR; S1 and S2 appreciated; no murmurs, no clicks, no rubs, no gallops RESP: Normal chest excursion without splinting or tachypnea; breath sounds clear and equal  bilaterally; no wheezes, no rhonchi, no rales, no hypoxia or respiratory distress, speaking full sentences ABD/GI: Normal bowel sounds; non-distended; soft, non-tender, no rebound, no guarding, no peritoneal signs BACK: The back appears normal EXT: Normal ROM in all joints; no deformity noted, no edema; no cyanosis  SKIN: Normal color for age and race; warm; no rash on exposed skin    ED Results / Procedures / Treatments   LABS: (all labs ordered are listed, but only abnormal results are displayed) Labs Reviewed  CBC WITH DIFFERENTIAL/PLATELET - Abnormal; Notable for the following components:      Result Value   Hemoglobin 9.6 (*)    HCT 32.7 (*)    MCV 72.0 (*)    MCH 21.1 (*)    MCHC 29.4 (*)    RDW 17.4 (*)    All other components within normal limits  COMPREHENSIVE METABOLIC PANEL - Abnormal; Notable for the following components:   Potassium 3.2 (*)    Glucose, Bld 147 (*)    Creatinine, Ser 1.03 (*)    Calcium 8.6 (*)    Albumin 3.0 (*)    AST 11 (*)    Anion gap 4 (*)    All other components within normal limits  GROUP A STREP BY PCR  LIPASE, BLOOD  TROPONIN I (HIGH SENSITIVITY)     EKG:  EKG Interpretation  Date/Time:  Thursday February 23 2022 03:02:02 EDT Ventricular Rate:  72 PR Interval:  228 QRS Duration: 91 QT Interval:  426 QTC Calculation: 467 R Axis:   48 Text Interpretation: Sinus rhythm Prolonged PR interval Confirmed by Pryor Curia [phone]) on 02/23/2022 3:58:15 AM         RADIOLOGY: My personal review and interpretation of imaging: No abscess, tonsillitis, epiglottitis, uvulitis.  I have personally reviewed all radiology reports.   CT Soft Tissue Neck W Contrast  Result Date: 02/23/2022 CLINICAL DATA:  51 year old female with nausea and vomiting. Epiglottitis or tonsillitis suspected. EXAM: CT NECK WITH CONTRAST TECHNIQUE: Multidetector CT imaging of the neck was performed using the standard protocol following the bolus administration of  intravenous contrast. RADIATION DOSE REDUCTION: This exam was performed according to the departmental dose-optimization program which includes automated exposure control, adjustment of the mA and/or kV according to patient size and/or use of iterative reconstruction technique. CONTRAST:  49m OMNIPAQUE IOHEXOL 300 MG/ML  SOLN COMPARISON:  Neck CT 01/16/2021. chest CTA 07/29/2021. FINDINGS: Pharynx and larynx: Subglottic larynx appears stable and normal. The glottis is open today. Motion artifact at the supraglottic larynx, but the epiglottis appears to remain thin and normal on series 3, image 40. Stable to slightly decreased appearance of bilateral tonsillar tissue at the oropharynx. Adenoid soft tissue appears stable or slightly smaller compared to last year. Parapharyngeal and retropharyngeal spaces are within normal limits. Salivary glands: Submandibular motion artifact. Grossly stable and normal submandibular glands. Sublingual space artifact, no definite sublingual space abnormality. Parotid spaces appear symmetric and normal. Thyroid: Mild motion artifact, negative. Lymph nodes: Subcentimeter bilateral cervical lymph nodes. 8-9 mm right side level 2 and level 3 lymph nodes are more conspicuous than last year and appear reactive (series 3, image 35). But no cystic or necrotic nodes are identified. Vascular: Major vascular structures in the neck and at the skull base remain patent. Limited intracranial: Negative. Visualized orbits: Negative. Mastoids and visualized paranasal sinuses: Clear. Skeleton: Carious bilateral maxillary dentition, maximal at the right maxillary bicuspids. 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 At C7. No acute osseous abnormality identified. Upper chest: Chronically dilated, capacious thoracic esophagus in the visible mediastinum  containing a combination of gas and fluid or debris. Appearance unchanged from December chest CTA. Otherwise negative visible superior  mediastinum. Respiratory motion, lung apices appear clear. Negative visible axillary lymph nodes. IMPRESSION: 1. Motion artifact at the supraglottic larynx and oropharynx. But no evidence of epiglottitis, and no convincing tonsillitis. 2. Reactive appearing right level 2 and level 3 lymph nodes. No suppurative nodes or abscess. 3. Carious maxillary dentition. 4. Chronically dilated thoracic esophagus, stable compared to chest CTA in December and Achalasia was described on a soft gram last year. 5. Advanced cervical spine degeneration for age, including probable ossification of the posterior longitudinal ligament (OPLL) at C7. Electronically Signed   By: Genevie Ann M.D.   On: 02/23/2022 04:25     PROCEDURES:  Critical Care performed: No     .1-3 Lead EKG Interpretation  Performed by: Lonzy Mato, Delice Bison, DO Authorized by: Amandalee Lacap, Delice Bison, DO     Interpretation: normal     ECG rate:  72   ECG rate assessment: normal     Rhythm: sinus rhythm     Ectopy: none     Conduction: normal       IMPRESSION / MDM / ASSESSMENT AND PLAN / ED COURSE  I reviewed the triage vital signs and the nursing notes.    Patient complains of sore throat and feeling her throat is closing with 2 episodes of vomiting.  The patient is on the cardiac monitor to evaluate for evidence of arrhythmia and/or significant heart rate changes.   DIFFERENTIAL DIAGNOSIS (includes but not limited to):   Allergic reaction, uvulitis, tonsillitis, pharyngitis, cannot rule out PTA, deep space neck infection.  Symptoms could also be due to GERD.  Low suspicion this is her anginal equivalent.  Doubt intra-abdominal process.  Abdominal exam benign.  Vomiting happened after getting Carafate.   Patient's presentation is most consistent with acute presentation with potential threat to life or bodily  function.   PLAN: We will obtain CBC, CMP, lipase, CT of the neck.  We will give Decadron, Protonix, Benadryl, Zofran for symptomatic relief.   MEDICATIONS GIVEN IN ED: Medications  sodium chloride 0.9 % bolus 1,000 mL (0 mLs Intravenous Stopped 02/23/22 0546)  ondansetron (ZOFRAN) injection 4 mg (4 mg Intravenous Given 02/23/22 0329)  pantoprazole (PROTONIX) injection 40 mg (40 mg Intravenous Given 02/23/22 0329)  dexamethasone (DECADRON) injection 10 mg (10 mg Intravenous Given 02/23/22 0329)  diphenhydrAMINE (BENADRYL) injection 25 mg (25 mg Intravenous Given 02/23/22 0329)  iohexol (OMNIPAQUE) 300 MG/ML solution 75 mL (75 mLs Intravenous Contrast Given 02/23/22 0401)     ED COURSE: Patient's labs reassuring.  She has anemia which she has had previously.  No leukocytosis.  Normal electrolytes.  LFTs, lipase normal.  Troponin negative.  CT of the neck reviewed/interpreted by myself and radiology and shows no epiglottitis, tonsillitis, abscess, deep space neck infection.  She has a chronically dilated thoracic esophagus and has known history of achalasia and dysphagia.  Suspect this is the cause of her pain and vomiting.  She is already on famotidine, Protonix and Carafate.  Will p.o. challenge here but if she continues to do well I feel she can be discharged back to her nursing facility with close outpatient GI follow-up.   Patient had a CT of her abdomen pelvis and urinalysis during her recent ED visit.  I do not feel these need to be repeated today.  Abdominal exam is benign.  No urinary symptoms.   Strep is negative today.  At this time, I do not feel there is any life-threatening condition present. I reviewed all nursing notes, vitals, pertinent  previous records.  All lab and urine results, EKGs, imaging ordered have been independently reviewed and interpreted by myself.  I reviewed all available radiology reports from any imaging ordered this visit.  Based on my assessment, I feel the  patient is safe to be discharged home without further emergent workup and can continue workup as an outpatient as needed. Discussed all findings, treatment plan as well as usual and customary return precautions with patient.  They verbalize understanding and are comfortable with this plan.  Outpatient follow-up has been provided as needed.  All questions have been answered.   CONSULTS: GI consult needed at this time.  Patient tolerating p.o.   OUTSIDE RECORDS REVIEWED: Reviewed patient recent cardiology visit with Minus Breeding on 01/30/2022.       FINAL CLINICAL IMPRESSION(S) / ED DIAGNOSES   Final diagnoses:  Gastroesophageal reflux disease, unspecified whether esophagitis present  Nausea and vomiting in adult     Rx / DC Orders   ED Discharge Orders          Ordered    ondansetron (ZOFRAN-ODT) 4 MG disintegrating tablet  Every 6 hours PRN        02/23/22 0540             Note:  This document was prepared using Dragon voice recognition software and may include unintentional dictation errors.   Apollo Timothy, Delice Bison, DO 02/23/22 8115    Hatem Cull, Delice Bison, DO 02/23/22 0630

## 2022-02-23 NOTE — ED Notes (Signed)
ED Provider at bedside. 

## 2022-02-23 NOTE — ED Notes (Signed)
Pt moved to [facility] pending EMS transport. Pt has purewick set up in hallway. Given graham crackers and diet ginger ale. Has phone in lap.

## 2022-02-23 NOTE — ED Notes (Signed)
Awaiting EMS transport still.

## 2022-02-23 NOTE — ED Notes (Signed)
Called EMS for transport back to [facility]

## 2022-03-20 ENCOUNTER — Emergency Department: Payer: Medicare Other

## 2022-03-20 ENCOUNTER — Emergency Department
Admission: EM | Admit: 2022-03-20 | Discharge: 2022-03-21 | Disposition: A | Discharge status: Rehabilitation Facility | Payer: Medicare Other | Attending: Emergency Medicine | Admitting: Emergency Medicine

## 2022-03-20 DIAGNOSIS — K224 Dyskinesia of esophagus: Secondary | ICD-10-CM | POA: Insufficient documentation

## 2022-03-20 DIAGNOSIS — R112 Nausea with vomiting, unspecified: Secondary | ICD-10-CM | POA: Insufficient documentation

## 2022-03-20 DIAGNOSIS — I69322 Dysarthria following cerebral infarction: Secondary | ICD-10-CM | POA: Diagnosis not present

## 2022-03-20 DIAGNOSIS — R0789 Other chest pain: Secondary | ICD-10-CM | POA: Insufficient documentation

## 2022-03-20 DIAGNOSIS — R1012 Left upper quadrant pain: Secondary | ICD-10-CM | POA: Insufficient documentation

## 2022-03-20 DIAGNOSIS — I700 Atherosclerosis of aorta: Secondary | ICD-10-CM | POA: Diagnosis not present

## 2022-03-20 DIAGNOSIS — R079 Chest pain, unspecified: Secondary | ICD-10-CM

## 2022-03-20 DIAGNOSIS — I252 Old myocardial infarction: Secondary | ICD-10-CM | POA: Insufficient documentation

## 2022-03-20 DIAGNOSIS — I69352 Hemiplegia and hemiparesis following cerebral infarction affecting left dominant side: Secondary | ICD-10-CM | POA: Insufficient documentation

## 2022-03-20 LAB — CBC WITH DIFFERENTIAL/PLATELET
Abs Immature Granulocytes: 0.03 10*3/uL (ref 0.00–0.07)
Basophils Absolute: 0 10*3/uL (ref 0.0–0.1)
Basophils Relative: 0 %
Eosinophils Absolute: 0.3 10*3/uL (ref 0.0–0.5)
Eosinophils Relative: 4 %
HCT: 35.7 % — ABNORMAL LOW (ref 36.0–46.0)
Hemoglobin: 10.8 g/dL — ABNORMAL LOW (ref 12.0–15.0)
Immature Granulocytes: 0 %
Lymphocytes Relative: 29 %
Lymphs Abs: 2.1 10*3/uL (ref 0.7–4.0)
MCH: 21.3 pg — ABNORMAL LOW (ref 26.0–34.0)
MCHC: 30.3 g/dL (ref 30.0–36.0)
MCV: 70.3 fL — ABNORMAL LOW (ref 80.0–100.0)
Monocytes Absolute: 0.6 10*3/uL (ref 0.1–1.0)
Monocytes Relative: 9 %
Neutro Abs: 4.2 10*3/uL (ref 1.7–7.7)
Neutrophils Relative %: 58 %
Platelets: 286 10*3/uL (ref 150–400)
RBC: 5.08 MIL/uL (ref 3.87–5.11)
RDW: 17.4 % — ABNORMAL HIGH (ref 11.5–15.5)
WBC: 7.3 10*3/uL (ref 4.0–10.5)
nRBC: 0 % (ref 0.0–0.2)

## 2022-03-20 LAB — COMPREHENSIVE METABOLIC PANEL
ALT: 11 U/L (ref 0–44)
AST: 11 U/L — ABNORMAL LOW (ref 15–41)
Albumin: 3.5 g/dL (ref 3.5–5.0)
Alkaline Phosphatase: 117 U/L (ref 38–126)
Anion gap: 7 (ref 5–15)
BUN: 11 mg/dL (ref 6–20)
CO2: 28 mmol/L (ref 22–32)
Calcium: 9.3 mg/dL (ref 8.9–10.3)
Chloride: 105 mmol/L (ref 98–111)
Creatinine, Ser: 0.98 mg/dL (ref 0.44–1.00)
GFR, Estimated: >60 mL/min (ref >60)
Glucose, Bld: 168 mg/dL — ABNORMAL HIGH (ref 70–99)
Potassium: 3.5 mmol/L (ref 3.5–5.1)
Sodium: 140 mmol/L (ref 135–145)
Total Bilirubin: 0.7 mg/dL (ref 0.3–1.2)
Total Protein: 8.4 g/dL — ABNORMAL HIGH (ref 6.5–8.1)

## 2022-03-20 LAB — LACTIC ACID, PLASMA: Lactic Acid, Venous: 0.9 mmol/L (ref 0.5–1.9)

## 2022-03-20 LAB — TROPONIN I (HIGH SENSITIVITY): Troponin I (High Sensitivity): 4 ng/L (ref <18)

## 2022-03-20 LAB — BRAIN NATRIURETIC PEPTIDE: B Natriuretic Peptide: 35.2 pg/mL (ref 0.0–100.0)

## 2022-03-20 MED ORDER — ONDANSETRON HCL 4 MG/2ML IJ SOLN
INTRAMUSCULAR | Status: AC
  Filled 2022-03-20: qty 2

## 2022-03-20 MED ORDER — SODIUM CHLORIDE 0.9 % IV BOLUS
1000.0000 mL | Freq: Once | INTRAVENOUS | Status: AC
  Administered 2022-03-20: 1000 mL via INTRAVENOUS

## 2022-03-20 MED ORDER — ONDANSETRON HCL 4 MG/2ML IJ SOLN
4.0000 mg | Freq: Once | INTRAMUSCULAR | Status: AC
  Administered 2022-03-20: 4 mg via INTRAVENOUS

## 2022-03-20 NOTE — ED Triage Notes (Signed)
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

## 2022-03-21 ENCOUNTER — Emergency Department: Payer: Medicare Other

## 2022-03-21 DIAGNOSIS — R0789 Other chest pain: Secondary | ICD-10-CM | POA: Diagnosis not present

## 2022-03-21 MED ORDER — IOHEXOL 350 MG/ML SOLN
100.0000 mL | Freq: Once | INTRAVENOUS | Status: AC | PRN
  Administered 2022-03-21: 100 mL via INTRAVENOUS

## 2022-03-21 NOTE — ED Provider Notes (Signed)
[facility] Provider Note   Event Date/Time   First MD Initiated Contact with Patient 03/20/22 2114     (approximate) History  Chest Pain (Patient from [facility] BIB EMS for LEFT sided chest pain and LEFT sided abdominal pain; Patient does have residual left hemiparesis and dysarthria from CVA; Patient did receive NTG at SNF that did not help with her CP; Reports that her last bowel movement was today and did not have diarrhea, but does have nausea with no vomiting) and Abdominal Pain  HPI Diamond Rice is a 51 y.o. female with a stated past medical history of CVA with residual dysarthria and left-sided deficits who presents complaining of left-sided chest pain and left upper quadrant abdominal pain that began about 6 PM this evening and is associated with nausea/vomiting.  Patient states that after dinner this evening she began having significant left upper quadrant abdominal pain with associated nausea and after an episode of vomiting developed left upper chest pain.  Patient received nitroglycerin prior to arrival with minimal improvement of her symptoms. ROS: Patient currently denies any vision changes, tinnitus, difficulty speaking, facial droop, sore throat, shortness of breath, diarrhea, dysuria, or weakness/numbness/paresthesias in any extremity   Physical Exam  Triage Vital Signs: ED Triage Vitals  Enc Vitals Group     BP 03/20/22 2127 (!) 123/92     Pulse Rate 03/20/22 2127 82     Resp 03/20/22 2127 17     Temp 03/20/22 2127 98.3 F (36.8 C)     Temp Source 03/20/22 2127 Oral     SpO2 03/20/22 2127 98 %     Weight 03/20/22 2128 246 lb (111.6 kg)     Height 03/20/22 2128 '5\' 1"'$  (1.549 m)     Head Circumference --      Peak Flow --      Pain Score 03/20/22 2128 10     Pain Loc --      Pain Edu? --      Excl. in [place]? --    Most recent vital signs: Vitals:   03/20/22 2200 03/20/22 2235  BP: (!) 147/105 (!) 141/94  Pulse: 87 78   Resp: 17 18  Temp:    SpO2: 97% 100%   General: Awake, oriented x4. CV:  Good peripheral perfusion.  Resp:  Normal effort.  Abd:  No distention.  Other:  Obese middle-aged African-American female laying in bed in no acute distress.  Tenderness to palpation over left anterior chest wall and left upper quadrant abdomen.  Limited range of motion in the left upper and lower extremities.  Dysarthric ED Results / Procedures / Treatments  Labs (all labs ordered are listed, but only abnormal results are displayed) Labs Reviewed  COMPREHENSIVE METABOLIC PANEL - Abnormal; Notable for the following components:      Result Value   Glucose, Bld 168 (*)    Total Protein 8.4 (*)    AST 11 (*)    All other components within normal limits  CBC WITH DIFFERENTIAL/PLATELET - Abnormal; Notable for the following components:   Hemoglobin 10.8 (*)    HCT 35.7 (*)    MCV 70.3 (*)    MCH 21.3 (*)    RDW 17.4 (*)    All other components within normal limits  BRAIN NATRIURETIC PEPTIDE  LACTIC ACID, PLASMA  LACTIC ACID, PLASMA  URINALYSIS, ROUTINE W REFLEX MICROSCOPIC  TROPONIN I (HIGH SENSITIVITY)  TROPONIN I (HIGH SENSITIVITY)   EKG ED  ECG REPORT I, Naaman Plummer, the attending physician, personally viewed and interpreted this ECG. Date: 03/21/2022 EKG Time: 2120 Rate: 84 Rhythm: normal sinus rhythm QRS Axis: normal Intervals: normal ST/T Wave abnormalities: normal Narrative Interpretation: no evidence of acute ischemia RADIOLOGY ED MD interpretation: One-view portable chest x-ray interpreted by me shows no evidence of acute abnormalities including no pneumonia, pneumothorax, or widened mediastinum -Agree with radiology assessment Official radiology report(s): DG Chest Port 1 View  Result Date: 03/20/2022 CLINICAL DATA:  Chest pain EXAM: PORTABLE CHEST 1 VIEW COMPARISON:  CT chest dated 07/29/2021 FINDINGS: Mild lingular scarring. Lungs otherwise clear. No pleural effusion or pneumothorax.  The heart is normal in size. Prominence of the right paratracheal stripe is related to a patulous, fluid-filled esophagus when correlating with prior CT. IMPRESSION: No evidence of acute cardiopulmonary disease. Electronically Signed   By: Julian Hy M.D.   On: 03/20/2022 22:40   PROCEDURES: Critical Care performed: No Procedures MEDICATIONS ORDERED IN ED: Medications  ondansetron (ZOFRAN) injection 4 mg (4 mg Intravenous Given 03/20/22 2308)  sodium chloride 0.9 % bolus 1,000 mL (1,000 mLs Intravenous New Bag/Given 03/20/22 2307)   IMPRESSION / MDM / ASSESSMENT AND PLAN / ED COURSE  I reviewed the triage vital signs and the nursing notes.                             The patient is on the cardiac monitor to evaluate for evidence of arrhythmia and/or significant heart rate changes. Patient's presentation is most consistent with acute presentation with potential threat to life or bodily function. Patient presents for acute nausea/vomiting The cause of the patients symptoms is not clear, but the patient is overall well appearing and is suspected to have a transient course of illness.  Given History and Exam there does not appear to be an emergent cause of the symptoms such as small bowel obstruction, coronary syndrome, bowel ischemia, DKA, pancreatitis, appendicitis, other acute abdomen or other emergent problem. Patient pending CT of the abdomen and pelvis at the end of my shift Care of this patient will be signed out to the oncoming physician at the end of my shift.  All pertinent patient information conveyed and all questions answered.  All further care and disposition decisions will be made by the oncoming physician.   FINAL CLINICAL IMPRESSION(S) / ED DIAGNOSES   Final diagnoses:  None   Rx / DC Orders   ED Discharge Orders     None      Note:  This document was prepared using Dragon voice recognition software and may include unintentional dictation errors.   Naaman Plummer, MD 03/21/22 [phone]

## 2022-03-21 NOTE — ED Notes (Signed)
Patient tolerated swallow screen. Patient drank a cup of ginger ale and ate graham crackers.

## 2022-03-21 NOTE — Discharge Instructions (Addendum)
Fortunately your evaluation was reassuring tonight and there is no evidence that you have a new medical condition that necessitates staying in the hospital.  It seems that you are still suffering from some issues with your esophagus, similar to that which led to your upper endoscopy by Dr. Marius Ditch back in February.  We recommend you continue taking your regular medications and call the office of Dr. Marius Ditch to schedule follow-up appointment.    Return to the emergency department if you develop new or worsening symptoms that concern you.

## 2022-03-21 NOTE — ED Provider Notes (Signed)
-----------------------------------------   12:10 AM on 03/21/2022 -----------------------------------------  Assuming care from Dr. Cheri Fowler.  In short, Diamond Rice is a 51 y.o. female with a chief complaint of chest pain, abdominal pain, nausea.  Refer to the original H&P for additional details.  The current plan of care is to follow up on CT scan and reassess.   Clinical Course as of 03/21/22 0440  Tue Mar 21, 2022  0326 CT Abdomen Pelvis W Contrast I viewed and interpreted the patient's CT of the abdomen pelvis with contrast.  The esophagus appears relatively full.  However the radiologist commented that the fluid-filled esophagus is very similar to what was seen on prior studies.  I reviewed her prior records including CT scans and prior ED visits, and she has known achalasia and esophageal dysmotility.  This appears to be essentially unchanged from prior.  I talked with the patient and she is awake and alert.  She said that she feels like her lunch got stuck.  Similar to prior visits, we will try a p.o. challenge and if she tolerates a p.o. challenge, she should be appropriate for discharge and outpatient follow-up with GI.  If she is not able to tolerate oral intake, she may need admission for urgent GI consultation and possible EGD. [CF]  8250 The patient tolerated ginger ale and graham crackers without any difficulty.  She is appropriate for discharge and outpatient follow-up with GI.  I reviewed the medical record and see that she had EGD on 10/19/2021.  I read the report by Dr. Marius Ditch and there was no clear or obvious abnormality identified.  However she has documented achalasia and repeated esophageal dysmotility seems to be shown on CT scans of the abdomen pelvis.  Given that she is tolerating oral intake, though I considered hospitalization, there is no need to proceed with it and keep her at this time.  She can follow-up as an outpatient.  I provided my usual and customary  follow-up recommendations and return precautions.  There is no evidence that she is suffering from ACS, PE, or other acute or emergent or life-threatening medical condition at this time. [CF]    Clinical Course User Index [CF] Hinda Kehr, MD     Hinda Kehr, MD 03/21/22 [phone]

## 2022-05-15 ENCOUNTER — Emergency Department
Admission: EM | Admit: 2022-05-15 | Discharge: 2022-05-15 | Disposition: A | Discharge status: Skilled Nursing Facility | Payer: Medicare Other | Attending: Emergency Medicine | Admitting: Emergency Medicine

## 2022-05-15 ENCOUNTER — Emergency Department: Payer: Medicare Other

## 2022-05-15 ENCOUNTER — Encounter: Payer: Self-pay | Admitting: Radiology

## 2022-05-15 DIAGNOSIS — R1012 Left upper quadrant pain: Secondary | ICD-10-CM | POA: Diagnosis present

## 2022-05-15 DIAGNOSIS — R112 Nausea with vomiting, unspecified: Secondary | ICD-10-CM | POA: Diagnosis not present

## 2022-05-15 DIAGNOSIS — R109 Unspecified abdominal pain: Secondary | ICD-10-CM

## 2022-05-15 LAB — CBC WITH DIFFERENTIAL/PLATELET
Abs Immature Granulocytes: 0.04 10*3/uL (ref 0.00–0.07)
Basophils Absolute: 0 10*3/uL (ref 0.0–0.1)
Basophils Relative: 1 %
Eosinophils Absolute: 0.4 10*3/uL (ref 0.0–0.5)
Eosinophils Relative: 5 %
HCT: 35.4 % — ABNORMAL LOW (ref 36.0–46.0)
Hemoglobin: 10.6 g/dL — ABNORMAL LOW (ref 12.0–15.0)
Immature Granulocytes: 1 %
Lymphocytes Relative: 28 %
Lymphs Abs: 2.1 10*3/uL (ref 0.7–4.0)
MCH: 21.3 pg — ABNORMAL LOW (ref 26.0–34.0)
MCHC: 29.9 g/dL — ABNORMAL LOW (ref 30.0–36.0)
MCV: 71.2 fL — ABNORMAL LOW (ref 80.0–100.0)
Monocytes Absolute: 0.8 10*3/uL (ref 0.1–1.0)
Monocytes Relative: 11 %
Neutro Abs: 4.1 10*3/uL (ref 1.7–7.7)
Neutrophils Relative %: 54 %
Platelets: 332 10*3/uL (ref 150–400)
RBC: 4.97 MIL/uL (ref 3.87–5.11)
RDW: 18.5 % — ABNORMAL HIGH (ref 11.5–15.5)
WBC: 7.5 10*3/uL (ref 4.0–10.5)
nRBC: 0 % (ref 0.0–0.2)

## 2022-05-15 LAB — COMPREHENSIVE METABOLIC PANEL
ALT: 9 U/L (ref 0–44)
AST: 14 U/L — ABNORMAL LOW (ref 15–41)
Albumin: 3.3 g/dL — ABNORMAL LOW (ref 3.5–5.0)
Alkaline Phosphatase: 85 U/L (ref 38–126)
Anion gap: 10 (ref 5–15)
BUN: 13 mg/dL (ref 6–20)
CO2: 26 mmol/L (ref 22–32)
Calcium: 9.2 mg/dL (ref 8.9–10.3)
Chloride: 106 mmol/L (ref 98–111)
Creatinine, Ser: 1.02 mg/dL — ABNORMAL HIGH (ref 0.44–1.00)
GFR, Estimated: >60 mL/min (ref >60)
Glucose, Bld: 83 mg/dL (ref 70–99)
Potassium: 3.2 mmol/L — ABNORMAL LOW (ref 3.5–5.1)
Sodium: 142 mmol/L (ref 135–145)
Total Bilirubin: 0.5 mg/dL (ref 0.3–1.2)
Total Protein: 7.9 g/dL (ref 6.5–8.1)

## 2022-05-15 LAB — TROPONIN I (HIGH SENSITIVITY): Troponin I (High Sensitivity): 3 ng/L (ref <18)

## 2022-05-15 LAB — LIPASE, BLOOD: Lipase: 29 U/L (ref 11–51)

## 2022-05-15 MED ORDER — METOCLOPRAMIDE HCL 5 MG/ML IJ SOLN
10.0000 mg | Freq: Once | INTRAMUSCULAR | Status: AC
Start: 2022-05-15 — End: 2022-05-15
  Administered 2022-05-15: 10 mg via INTRAVENOUS
  Filled 2022-05-15: qty 2

## 2022-05-15 MED ORDER — IOHEXOL 300 MG/ML  SOLN
100.0000 mL | Freq: Once | INTRAMUSCULAR | Status: AC | PRN
  Administered 2022-05-15: 100 mL via INTRAVENOUS

## 2022-05-15 MED ORDER — POTASSIUM CHLORIDE CRYS ER 20 MEQ PO TBCR
40.0000 meq | EXTENDED_RELEASE_TABLET | Freq: Once | ORAL | Status: DC
Start: 2022-05-15 — End: 2022-05-15

## 2022-05-15 NOTE — ED Triage Notes (Signed)
Left flank pain , nausea and vomiting since Friday , from [facility]

## 2022-05-15 NOTE — ED Provider Notes (Signed)
[facility] Provider Note    Event Date/Time   First MD Initiated Contact with Patient 05/15/22 [phone]     (approximate)   History   Abdominal Pain  HPI  Diamond Rice is a 51 y.o. female  who has known history of achalasia and previous ER visits for N/V and left sided abdominal pain, who presents to the emergency department today because of concern for n/v and upper left abdominal pain. Patient says that over the past couple of days she has had the n/v. Developed the abdominal pain this morning. The pain today does not feel like the pain she has had previously with her other ER visits. Denies any fevers.     Physical Exam   Triage Vital Signs: ED Triage Vitals  Enc Vitals Group     BP 05/15/22 0738 (!) 143/112     Pulse Rate 05/15/22 0738 85     Resp 05/15/22 0738 17     Temp 05/15/22 0738 98.5 F (36.9 C)     Temp Source 05/15/22 0738 Oral     SpO2 05/15/22 0738 98 %     Weight 05/15/22 0739 246 lb 14.6 oz (112 kg)     Height 05/15/22 0739 '5\' 4"'$  (1.626 m)     Head Circumference --      Peak Flow --      Pain Score --      Pain Loc --      Pain Edu? --      Excl. in [place]? --     Most recent vital signs: Vitals:   05/15/22 0738  BP: (!) 143/112  Pulse: 85  Resp: 17  Temp: 98.5 F (36.9 C)  SpO2: 98%    General: Awake, alert, oriented. CV:  Good peripheral perfusion. Regular rate.  Resp:  Normal effort.  Abd:  No distention. Tender to palpation in the left upper quadrant.     ED Results / Procedures / Treatments   Labs (all labs ordered are listed, but only abnormal results are displayed) Labs Reviewed  CBC WITH DIFFERENTIAL/PLATELET - Abnormal; Notable for the following components:      Result Value   Hemoglobin 10.6 (*)    HCT 35.4 (*)    MCV 71.2 (*)    MCH 21.3 (*)    MCHC 29.9 (*)    RDW 18.5 (*)    All other components within normal limits  COMPREHENSIVE METABOLIC PANEL - Abnormal; Notable for the following  components:   Potassium 3.2 (*)    Creatinine, Ser 1.02 (*)    Albumin 3.3 (*)    AST 14 (*)    All other components within normal limits  LIPASE, BLOOD  URINALYSIS, ROUTINE W REFLEX MICROSCOPIC  TROPONIN I (HIGH SENSITIVITY)  TROPONIN I (HIGH SENSITIVITY)     EKG  I, Nance Pear, attending physician, personally viewed and interpreted this EKG  EKG Time: 0758 Rate: 83 Rhythm: sinus rhythm Axis: normal Intervals: qtc 474 QRS: narrow, q waves v2 ST changes: no st elevation Impression: abnormal ekg   RADIOLOGY I independently interpreted and visualized the ct abd/pel. My interpretation: No free air Radiology interpretation:  IMPRESSION:  1. No acute abdominal/pelvic findings, mass lesions or adenopathy.  2. Stable markedly distended distal esophagus with possible food  impaction.  3. Stable uterine fibroids.    PROCEDURES:  Critical Care performed: No  Procedures   MEDICATIONS ORDERED IN ED: Medications - No data to display   IMPRESSION /  MDM / ASSESSMENT AND PLAN / ED COURSE  I reviewed the triage vital signs and the nursing notes.                              Differential diagnosis includes, but is not limited to, gastritis, appendicitis, diverticulitis.   Patient's presentation is most consistent with acute presentation with potential threat to life or bodily function.  Patient presented to the emergency department today because of concerns for nausea vomiting abdominal pain.  She has been seen in the emergency department for similar symptoms in the past.  Blood work without concerning findings here.  Did obtain a CT scan which is consistent with previous CT scans.  Did recommend patient follow-up with her GI doctor.   FINAL CLINICAL IMPRESSION(S) / ED DIAGNOSES   Final diagnoses:  Abdominal pain, unspecified abdominal location      Note:  This document was prepared using Dragon voice recognition software and may include unintentional dictation  errors.    Nance Pear, MD 05/15/22 [phone]

## 2022-05-15 NOTE — Discharge Instructions (Signed)
Please seek medical attention for any high fevers, chest pain, shortness of breath, change in behavior, persistent vomiting, bloody stool or any other new or concerning symptoms.  

## 2022-05-16 ENCOUNTER — Other Ambulatory Visit (INDEPENDENT_AMBULATORY_CARE_PROVIDER_SITE_OTHER): Payer: Self-pay | Admitting: Nurse Practitioner

## 2022-05-16 DIAGNOSIS — Z9889 Other specified postprocedural states: Secondary | ICD-10-CM

## 2022-05-17 ENCOUNTER — Ambulatory Visit (INDEPENDENT_AMBULATORY_CARE_PROVIDER_SITE_OTHER): Payer: Medicare Other | Admitting: Nurse Practitioner

## 2022-05-17 ENCOUNTER — Ambulatory Visit (INDEPENDENT_AMBULATORY_CARE_PROVIDER_SITE_OTHER): Payer: Medicare Other

## 2022-05-17 ENCOUNTER — Encounter (INDEPENDENT_AMBULATORY_CARE_PROVIDER_SITE_OTHER): Payer: Self-pay | Admitting: Nurse Practitioner

## 2022-05-17 VITALS — BP 138/92 | HR 87 | Resp 16

## 2022-05-17 DIAGNOSIS — I739 Peripheral vascular disease, unspecified: Secondary | ICD-10-CM

## 2022-05-17 DIAGNOSIS — E119 Type 2 diabetes mellitus without complications: Secondary | ICD-10-CM

## 2022-05-17 DIAGNOSIS — Z794 Long term (current) use of insulin: Secondary | ICD-10-CM

## 2022-05-17 DIAGNOSIS — Z9889 Other specified postprocedural states: Secondary | ICD-10-CM | POA: Diagnosis not present

## 2022-05-17 NOTE — Progress Notes (Signed)
Subjective:    Patient ID: Diamond Rice, female    DOB: [birth year], 51 y.o.   MRN: [id number] Chief Complaint  Patient presents with   Follow-up    Ultrasound follow up    Diamond Rice returns today for noninvasive studies of her for arterial disease.  The patient has left-sided weakness.  She notes that she has not had any worsening pain or symptoms of her lower extremity.  Her largest issues have been her stomach and having abdominal pain she does not walk frequently.  But she denies any rest pain like symptoms.  She also denies having any open wounds or ulcerations.  Today noninvasive studies show an ABI of 1.27 on the right and 1.08 on the left.  The patient has biphasic tibial artery cords bilaterally with good toe waveforms bilaterally.  Been consistent for the last year.    Review of Systems  Musculoskeletal:  Positive for gait problem.       Objective:   Physical Exam Vitals reviewed.  HENT:     Head: Normocephalic.  Cardiovascular:     Rate and Rhythm: Normal rate.     Pulses:          Dorsalis pedis pulses are detected w/ Doppler on the right side and detected w/ Doppler on the left side.       Posterior tibial pulses are detected w/ Doppler on the right side and detected w/ Doppler on the left side.  Pulmonary:     Effort: Pulmonary effort is normal.  Skin:    General: Skin is warm and dry.  Neurological:     Mental Status: She is alert and oriented to person, place, and time.  Psychiatric:        Mood and Affect: Mood normal.        Behavior: Behavior normal.        Judgment: Judgment normal.     BP (!) 138/92 (BP Location: Right Arm)   Pulse 87   Resp 16   LMP 04/26/2018 (Approximate)   Past Medical History:  Diagnosis Date   Acute ischemic stroke (HCC) 12/27/2020   Acute renal failure (ARF) (HCC) 04/02/2018   Anxiety    Chronic lower back pain 04/17/2012   "just got over some; catched when I walked"   COPD (chronic obstructive pulmonary disease)  (HCC)    Critical lower limb ischemia (HCC) 05/27/2018   Depression    Headache(784.0) 04/17/2012   "~ qod; lately waking up in am w/one"   High cholesterol    Hypertension    Migraines 04/17/2012   Obesity    Sleep apnea    CPAP   Stroke (HCC)    Type II diabetes mellitus (HCC) 08/28/2002    Social History   Socioeconomic History   Marital status: Single    Spouse name: Not on file   Number of children: 0   Years of education: Not on file   Highest education level: Not on file  Occupational History   Occupation: disabled  Tobacco Use   Smoking status: Former    Packs/day: 0.25    Years: 28.00    Total pack years: 7.00    Types: Cigarettes   Smokeless tobacco: Never  Vaping Use   Vaping Use: Never used  Substance and Sexual Activity   Alcohol use: No    Alcohol/week: 0.0 standard drinks of alcohol    Comment: rare   Drug use: Yes    Types: Marijuana, "Crack" cocaine  Comment: no drugs for past year   Sexual activity: Not Currently    Birth control/protection: None  Other Topics Concern   Not on file  Social History Narrative   Was a  CNA at [employer]. Lives with godmother.   03/03/21 residing at [facility] since 01/03/21     Social Determinants of Health   Financial Resource Strain: Not on file  Food Insecurity: Not on file  Transportation Needs: Not on file  Physical Activity: Not on file  Stress: Not on file  Social Connections: Not on file  Intimate Partner Violence: Not on file    Past Surgical History:  Procedure Laterality Date   ABDOMINAL AORTOGRAM W/LOWER EXTREMITY Left 05/27/2018   Procedure: ABDOMINAL AORTOGRAM W/LOWER EXTREMITY Runoff and Possible Intervention;  Surgeon: Waynetta Sandy, MD;  Location: [place] [facility];  Service: Cardiovascular;  Laterality: Left;   APPLICATION OF WOUND VAC Left 06/06/2018   Procedure: APPLICATION OF WOUND VAC;  Surgeon: Waynetta Sandy, MD;  Location: [place];  Service:  Vascular;  Laterality: Left;   BALLOON DILATION N/A 12/31/2020   Procedure: [place];  Surgeon: Ronnette Juniper, MD;  Location: [place];  Service: Gastroenterology;  Laterality: N/A;   BIOPSY  04/04/2018   Procedure: BIOPSY;  Surgeon: [name], MD;  Location: [facility] ENDOSCOPY;  Service: Endoscopy;;   BIOPSY  10/27/2020   Procedure: BIOPSY;  Surgeon: Arta Silence, MD;  Location: [facility] ENDOSCOPY;  Service: Endoscopy;;   BOTOX INJECTION N/A 12/31/2020   Procedure: BOTOX INJECTION;  Surgeon: Ronnette Juniper, MD;  Location: [place];  Service: Gastroenterology;  Laterality: N/A;   CARDIAC CATHETERIZATION  ~ 2011   COLONOSCOPY N/A 04/04/2018   Procedure: COLONOSCOPY;  Surgeon: [name], MD;  Location: [facility] ENDOSCOPY;  Service: Endoscopy;  Laterality: N/A;   COLONOSCOPY WITH PROPOFOL N/A 10/27/2020   Procedure: COLONOSCOPY WITH PROPOFOL;  Surgeon: Arta Silence, MD;  Location: [facility] ENDOSCOPY;  Service: Endoscopy;  Laterality: N/A;   ESOPHAGOGASTRODUODENOSCOPY N/A 12/31/2020   Procedure: ESOPHAGOGASTRODUODENOSCOPY (EGD);  Surgeon: Ronnette Juniper, MD;  Location: [place];  Service: Gastroenterology;  Laterality: N/A;   ESOPHAGOGASTRODUODENOSCOPY (EGD) WITH PROPOFOL N/A 10/27/2020   Procedure: ESOPHAGOGASTRODUODENOSCOPY (EGD) WITH PROPOFOL  WITH POSSIBLE DIL;  Surgeon: Arta Silence, MD;  Location: [facility] ENDOSCOPY;  Service: Endoscopy;  Laterality: N/A;   ESOPHAGOGASTRODUODENOSCOPY (EGD) WITH PROPOFOL N/A 10/19/2021   Procedure: ESOPHAGOGASTRODUODENOSCOPY (EGD) WITH PROPOFOL;  Surgeon: Lin Landsman, MD;  Location: [place];  Service: Gastroenterology;  Laterality: N/A;   FOREIGN BODY REMOVAL  12/31/2020   Procedure: FOREIGN BODY REMOVAL;  Surgeon: Ronnette Juniper, MD;  Location: [facility] ENDOSCOPY;  Service: Gastroenterology;;   LOWER EXTREMITY ANGIOGRAM Left 05/27/2018   PERIPHERAL VASCULAR INTERVENTION Left 05/27/2018   Procedure: PERIPHERAL VASCULAR INTERVENTION;  Surgeon: Waynetta Sandy, MD;   Location: [place] [facility];  Service: Cardiovascular;  Laterality: Left;   POLYPECTOMY  04/04/2018   Procedure: POLYPECTOMY;  Surgeon: [name], MD;  Location: [facility] ENDOSCOPY;  Service: Endoscopy;;   POLYPECTOMY  10/27/2020   Procedure: POLYPECTOMY;  Surgeon: Arta Silence, MD;  Location: [facility] ENDOSCOPY;  Service: Endoscopy;;   TRANSMETATARSAL AMPUTATION Left 05/28/2018   Procedure: AMPUTATION TOES THREE, FOUR AND FIVE on left foot;  Surgeon: Waynetta Sandy, MD;  Location: [place];  Service: Vascular;  Laterality: Left;   WOUND DEBRIDEMENT Left 06/06/2018   Procedure: DEBRIDEMENT WOUND LEFT FOOT;  Surgeon: Waynetta Sandy, MD;  Location: [place];  Service: Vascular;  Laterality: Left;    Family  History  Problem Relation Age of Onset   Stroke Brother 66   Stroke Brother 75   Heart attack Mother 46   Stroke Father 83    Allergies  Allergen Reactions   Morphine And Related Hives   Metformin Diarrhea and Other (See Comments)    "Allergic," per [facility]       Latest Ref Rng & Units 05/15/2022    7:44 AM 03/20/2022   10:56 PM 02/23/2022    4:26 AM  CBC  WBC 4.0 - 10.5 K/uL 7.5  7.3  7.5   Hemoglobin 12.0 - 15.0 g/dL 10.6  10.8  9.6   Hematocrit 36.0 - 46.0 % 35.4  35.7  32.7   Platelets 150 - 400 K/uL 332  286  259       CMP     Component Value Date/Time   NA 142 05/15/2022 0744   NA 141 12/17/2019 0000   K 3.2 (L) 05/15/2022 0744   CL 106 05/15/2022 0744   CO2 26 05/15/2022 0744   GLUCOSE 83 05/15/2022 0744   BUN 13 05/15/2022 0744   BUN 14 12/17/2019 0000   CREATININE 1.02 (H) 05/15/2022 0744   CREATININE 1.81 (H) 05/23/2018 1620   CALCIUM 9.2 05/15/2022 0744   PROT 7.9 05/15/2022 0744   ALBUMIN 3.3 (L) 05/15/2022 0744   AST 14 (L) 05/15/2022 0744   ALT 9 05/15/2022 0744   ALKPHOS 85 05/15/2022 0744   BILITOT 0.5 05/15/2022 0744   GFRNONAA >60 05/15/2022 0744   GFRNONAA 54 (L) 09/28/2014 1034   GFRAA >60 02/17/2020 1940   GFRAA 62 09/28/2014 1034      No results found.     Assessment & Plan:   1. Peripheral arterial disease with history of revascularization (HCC)  Recommend:  The patient has evidence of atherosclerosis of the lower extremities with claudication.  The patient does not voice lifestyle limiting changes at this point in time.  Noninvasive studies do not suggest clinically significant change.  No invasive studies, angiography or surgery at this time The patient should continue walking and begin a more formal exercise program.  The patient should continue antiplatelet therapy and aggressive treatment of the lipid abnormalities  No changes in the patient's medications at this time  Continued surveillance is indicated as atherosclerosis is likely to progress with time.    The patient will continue follow up with noninvasive studies as ordered.    2. Type 2 diabetes mellitus without complication, with long-term current use of insulin ([place]) Continue hypoglycemic medications as already ordered, these medications have been reviewed and there are no changes at this time.  Hgb A1C to be monitored as already arranged by primary service    Current Outpatient Medications on File Prior to Visit  Medication Sig Dispense Refill   ACCU-CHEK GUIDE test strip 2 (two) times daily. as directed     Accu-Chek Softclix Lancets lancets 2 (two) times daily.     albuterol (PROVENTIL) (2.5 MG/3ML) 0.083% nebulizer solution Take 3 mLs (2.5 mg total) by nebulization every 4 (four) hours as needed for wheezing or shortness of breath. 75 mL 0   amLODipine (NORVASC) 10 MG tablet Take 1 tablet (10 mg total) by mouth daily. 30 tablet 0   aspirin EC 81 MG EC tablet Take 1 tablet (81 mg total) by mouth daily.     atorvastatin (LIPITOR) 40 MG tablet Take 2 tablets (80 mg total) by mouth daily at 6 PM.     Blood Glucose  Monitoring Suppl (ACCU-CHEK GUIDE) w/Device KIT USE AS DIRECTED TO CHECK BLOOD SUGAR     carboxymethylcellulose (REFRESH PLUS)  0.5 % SOLN 1 drop at bedtime.     carvedilol (COREG) 12.5 MG tablet Take 12.5 mg by mouth 2 (two) times daily with a meal.     cloNIDine (CATAPRES - DOSED IN MG/24 HR) 0.3 mg/24hr patch 0.3 mg once a week.     famotidine (PEPCID) 40 MG tablet Take 40 mg by mouth daily.     FLUoxetine (PROZAC) 20 MG capsule Take 20 mg by mouth daily.     hydrALAZINE (APRESOLINE) 100 MG tablet Take 1 tablet (100 mg total) by mouth 3 (three) times daily. 90 tablet 0   insulin aspart (NOVOLOG) 100 UNIT/ML injection Inject 0-9 Units into the skin 3 (three) times daily with meals. Correction coverage: Sensitive (thin, NPO, renal) CBG < 70: Implement Hypoglycemia protocol. CBG 70 - 120: 0 units CBG 121 - 150: 1 unit CBG 151 - 200: 2 units CBG 201 - 250: 3 units CBG 251 - 300: 5 units CBG 301 - 350: 7 units CBG 351 - 400: 9 units CBG > 400: call MD.     insulin glargine (LANTUS) 100 UNIT/ML injection Inject 0.1 mLs (10 Units total) into the skin daily. 10 mL 0   Insulin lispro (HUMALOG JUNIOR KWIKPEN) 100 UNIT/ML Inject into the skin. Sliding scale     lisinopril (ZESTRIL) 40 MG tablet Take 1 tablet (40 mg total) by mouth daily. 30 tablet 0   magnesium hydroxide (MILK OF MAGNESIA) 400 MG/5ML suspension Take by mouth daily as needed for mild constipation.     Melatonin 10 MG CAPS Take by mouth.     mirtazapine (REMERON) 30 MG tablet Take 30 mg by mouth at bedtime.     ondansetron (ZOFRAN-ODT) 4 MG disintegrating tablet Take 1 tablet (4 mg total) by mouth every 6 (six) hours as needed for nausea or vomiting. 20 tablet 0   senna (SENOKOT) 8.6 MG tablet Take 1 tablet by mouth daily.     traMADol (ULTRAM) 50 MG tablet Take by mouth every 6 (six) hours as needed.     TRULICITY 3.61 QA/4.4LP SOPN Inject into the skin.     pantoprazole (PROTONIX) 40 MG tablet Take 1 tablet (40 mg total) by mouth daily for 14 days. 14 tablet 0   sucralfate (CARAFATE) 1 g tablet Take 1 tablet (1 g total) by mouth 4 (four) times daily -   with meals and at bedtime for 7 days. 28 tablet 0   No current facility-administered medications on file prior to visit.    There are no Patient Instructions on file for this visit. No follow-ups on file.   Kris Hartmann, NP

## 2022-05-25 ENCOUNTER — Telehealth: Payer: Self-pay

## 2022-05-25 NOTE — Telephone Encounter (Signed)
Reviewed esophageal manometry results from [facility].  Results were interpreted by Dr. Debbe Odea, GI.  Patient is diagnosed with type II achalasia.  I do not manage type II achalasia.  She will need special type of dilation that we do not do it at [facility].  Therefore, please put in an urgent referral to Dr. Michiel Cowboy Dx: Type II achalasia  In the meantime, I will see if we can get her sooner appt with Dr. Silverio Decamp at [place]

## 2022-05-25 NOTE — Telephone Encounter (Signed)
Zenovia Jordan is calling because patient is no better from when she was seen on 09/27/2021. She is still having dysphagia and making gargling sound. She did have procedure done at [facility] but Magda Paganini does not think she needs to wait till 07/26/2022 till anything is done. Please advise

## 2022-05-26 NOTE — Telephone Encounter (Signed)
Placed urgent referral to [facility]. Does patient still need appointment with you on 07/26/2022

## 2022-05-26 NOTE — Telephone Encounter (Signed)
Have canceled appointment with Dr. Marius Ditch called informed Zenovia Jordan of the urgent referral and cancel the appointment

## 2022-05-31 ENCOUNTER — Emergency Department: Payer: Medicare Other

## 2022-05-31 ENCOUNTER — Other Ambulatory Visit: Payer: Self-pay

## 2022-05-31 ENCOUNTER — Inpatient Hospital Stay
Admission: EM | Admit: 2022-05-31 | Discharge: 2022-06-05 | DRG: 392 | Disposition: A | Discharge status: Skilled Nursing Facility | Payer: Medicare Other | Attending: Internal Medicine | Admitting: Internal Medicine

## 2022-05-31 ENCOUNTER — Encounter: Payer: Self-pay | Admitting: Intensive Care

## 2022-05-31 DIAGNOSIS — E1151 Type 2 diabetes mellitus with diabetic peripheral angiopathy without gangrene: Secondary | ICD-10-CM | POA: Diagnosis present

## 2022-05-31 DIAGNOSIS — F32A Depression, unspecified: Secondary | ICD-10-CM | POA: Diagnosis present

## 2022-05-31 DIAGNOSIS — Z885 Allergy status to narcotic agent status: Secondary | ICD-10-CM

## 2022-05-31 DIAGNOSIS — Y929 Unspecified place or not applicable: Secondary | ICD-10-CM

## 2022-05-31 DIAGNOSIS — F17210 Nicotine dependence, cigarettes, uncomplicated: Secondary | ICD-10-CM | POA: Diagnosis present

## 2022-05-31 DIAGNOSIS — G4733 Obstructive sleep apnea (adult) (pediatric): Secondary | ICD-10-CM | POA: Diagnosis present

## 2022-05-31 DIAGNOSIS — E1122 Type 2 diabetes mellitus with diabetic chronic kidney disease: Secondary | ICD-10-CM | POA: Diagnosis present

## 2022-05-31 DIAGNOSIS — I129 Hypertensive chronic kidney disease with stage 1 through stage 4 chronic kidney disease, or unspecified chronic kidney disease: Secondary | ICD-10-CM | POA: Diagnosis present

## 2022-05-31 DIAGNOSIS — Z6841 Body Mass Index (BMI) 40.0 and over, adult: Secondary | ICD-10-CM

## 2022-05-31 DIAGNOSIS — Z79899 Other long term (current) drug therapy: Secondary | ICD-10-CM

## 2022-05-31 DIAGNOSIS — Z888 Allergy status to other drugs, medicaments and biological substances status: Secondary | ICD-10-CM

## 2022-05-31 DIAGNOSIS — R112 Nausea with vomiting, unspecified: Secondary | ICD-10-CM

## 2022-05-31 DIAGNOSIS — I69398 Other sequelae of cerebral infarction: Secondary | ICD-10-CM

## 2022-05-31 DIAGNOSIS — K269 Duodenal ulcer, unspecified as acute or chronic, without hemorrhage or perforation: Secondary | ICD-10-CM | POA: Diagnosis present

## 2022-05-31 DIAGNOSIS — Z823 Family history of stroke: Secondary | ICD-10-CM

## 2022-05-31 DIAGNOSIS — K220 Achalasia of cardia: Principal | ICD-10-CM | POA: Diagnosis present

## 2022-05-31 DIAGNOSIS — Z89422 Acquired absence of other left toe(s): Secondary | ICD-10-CM

## 2022-05-31 DIAGNOSIS — F419 Anxiety disorder, unspecified: Secondary | ICD-10-CM | POA: Diagnosis present

## 2022-05-31 DIAGNOSIS — I6930 Unspecified sequelae of cerebral infarction: Secondary | ICD-10-CM

## 2022-05-31 DIAGNOSIS — E7800 Pure hypercholesterolemia, unspecified: Secondary | ICD-10-CM | POA: Diagnosis present

## 2022-05-31 DIAGNOSIS — E66812 Obesity, class 2: Secondary | ICD-10-CM

## 2022-05-31 DIAGNOSIS — N1831 Chronic kidney disease, stage 3a: Secondary | ICD-10-CM | POA: Diagnosis present

## 2022-05-31 DIAGNOSIS — E860 Dehydration: Secondary | ICD-10-CM | POA: Diagnosis present

## 2022-05-31 DIAGNOSIS — Z7982 Long term (current) use of aspirin: Secondary | ICD-10-CM

## 2022-05-31 DIAGNOSIS — I10 Essential (primary) hypertension: Secondary | ICD-10-CM | POA: Diagnosis present

## 2022-05-31 DIAGNOSIS — J449 Chronic obstructive pulmonary disease, unspecified: Secondary | ICD-10-CM | POA: Diagnosis present

## 2022-05-31 DIAGNOSIS — F12280 Cannabis dependence with cannabis-induced anxiety disorder: Secondary | ICD-10-CM | POA: Diagnosis present

## 2022-05-31 DIAGNOSIS — N183 Chronic kidney disease, stage 3 unspecified: Secondary | ICD-10-CM | POA: Diagnosis present

## 2022-05-31 DIAGNOSIS — T18128A Food in esophagus causing other injury, initial encounter: Secondary | ICD-10-CM | POA: Diagnosis present

## 2022-05-31 DIAGNOSIS — F1910 Other psychoactive substance abuse, uncomplicated: Secondary | ICD-10-CM | POA: Diagnosis present

## 2022-05-31 DIAGNOSIS — E876 Hypokalemia: Secondary | ICD-10-CM | POA: Diagnosis present

## 2022-05-31 DIAGNOSIS — Z8249 Family history of ischemic heart disease and other diseases of the circulatory system: Secondary | ICD-10-CM

## 2022-05-31 DIAGNOSIS — Z794 Long term (current) use of insulin: Secondary | ICD-10-CM

## 2022-05-31 LAB — CBC
HCT: 38.1 % (ref 36.0–46.0)
Hemoglobin: 11.6 g/dL — ABNORMAL LOW (ref 12.0–15.0)
MCH: 21.4 pg — ABNORMAL LOW (ref 26.0–34.0)
MCHC: 30.4 g/dL (ref 30.0–36.0)
MCV: 70.3 fL — ABNORMAL LOW (ref 80.0–100.0)
Platelets: 274 10*3/uL (ref 150–400)
RBC: 5.42 MIL/uL — ABNORMAL HIGH (ref 3.87–5.11)
RDW: 18.9 % — ABNORMAL HIGH (ref 11.5–15.5)
WBC: 7.7 10*3/uL (ref 4.0–10.5)
nRBC: 0 % (ref 0.0–0.2)

## 2022-05-31 LAB — COMPREHENSIVE METABOLIC PANEL
ALT: 9 U/L (ref 0–44)
AST: 16 U/L (ref 15–41)
Albumin: 3.1 g/dL — ABNORMAL LOW (ref 3.5–5.0)
Alkaline Phosphatase: 70 U/L (ref 38–126)
Anion gap: 14 (ref 5–15)
BUN: 12 mg/dL (ref 6–20)
CO2: 24 mmol/L (ref 22–32)
Calcium: 9 mg/dL (ref 8.9–10.3)
Chloride: 105 mmol/L (ref 98–111)
Creatinine, Ser: 0.82 mg/dL (ref 0.44–1.00)
GFR, Estimated: >60 mL/min (ref >60)
Glucose, Bld: 88 mg/dL (ref 70–99)
Potassium: 3.1 mmol/L — ABNORMAL LOW (ref 3.5–5.1)
Sodium: 143 mmol/L (ref 135–145)
Total Bilirubin: 1.2 mg/dL (ref 0.3–1.2)
Total Protein: 7.8 g/dL (ref 6.5–8.1)

## 2022-05-31 LAB — LIPASE, BLOOD: Lipase: 38 U/L (ref 11–51)

## 2022-05-31 LAB — TROPONIN I (HIGH SENSITIVITY)
Troponin I (High Sensitivity): 3 ng/L (ref <18)
Troponin I (High Sensitivity): 3 ng/L (ref <18)

## 2022-05-31 LAB — GLUCOSE, CAPILLARY: Glucose-Capillary: 72 mg/dL (ref 70–99)

## 2022-05-31 MED ORDER — ONDANSETRON HCL 4 MG/2ML IJ SOLN
4.0000 mg | Freq: Once | INTRAMUSCULAR | Status: AC
  Administered 2022-05-31: 4 mg via INTRAVENOUS
  Filled 2022-05-31: qty 2

## 2022-05-31 MED ORDER — ONDANSETRON HCL 4 MG/2ML IJ SOLN
4.0000 mg | Freq: Four times a day (QID) | INTRAMUSCULAR | Status: DC | PRN
  Administered 2022-06-01 – 2022-06-04 (×9): 4 mg via INTRAVENOUS
  Filled 2022-05-31 (×9): qty 2

## 2022-05-31 MED ORDER — ALUM & MAG HYDROXIDE-SIMETH 200-200-20 MG/5ML PO SUSP
30.0000 mL | Freq: Once | ORAL | Status: DC
  Filled 2022-05-31: qty 30

## 2022-05-31 MED ORDER — KETOROLAC TROMETHAMINE 30 MG/ML IJ SOLN
30.0000 mg | Freq: Four times a day (QID) | INTRAMUSCULAR | Status: DC | PRN
  Administered 2022-05-31 – 2022-06-03 (×4): 30 mg via INTRAVENOUS
  Filled 2022-05-31 (×4): qty 1

## 2022-05-31 MED ORDER — POTASSIUM CHLORIDE 2 MEQ/ML IV SOLN
INTRAVENOUS | Status: DC
  Filled 2022-05-31 (×14): qty 1000

## 2022-05-31 MED ORDER — LACTATED RINGERS IV SOLN: INTRAVENOUS | Status: DC

## 2022-05-31 MED ORDER — IOHEXOL 300 MG/ML  SOLN
100.0000 mL | Freq: Once | INTRAMUSCULAR | Status: AC | PRN
  Administered 2022-05-31: 100 mL via INTRAVENOUS

## 2022-05-31 MED ORDER — ONDANSETRON HCL 4 MG PO TABS: 4.0000 mg | ORAL_TABLET | Freq: Four times a day (QID) | ORAL | Status: DC | PRN

## 2022-05-31 MED ORDER — SODIUM CHLORIDE 0.9 % IV SOLN
12.5000 mg | Freq: Once | INTRAVENOUS | Status: AC
  Administered 2022-05-31: 12.5 mg via INTRAVENOUS
  Filled 2022-05-31 (×2): qty 0.5

## 2022-05-31 NOTE — ED Triage Notes (Addendum)
Patient arrived by EMS from [facility]. Staff wanted patient transferred to [facility] for dysphagia. Staff reports everything she tries to eat she throws up for several weeks.   Hx Strokes X5 and deficits to left side  Patient c/o abdominal pain

## 2022-05-31 NOTE — ED Notes (Signed)
Called [facility] transfer center for GI  spoke to [place], powershared images  [phone]

## 2022-05-31 NOTE — H&P (Signed)
History and Physical    Patient: Diamond Rice BWL:[id number] DOB: Dec 25, 1970 DOA: 05/31/2022 DOS: the patient was seen and examined on 05/31/2022 PCP: System, Provider Not In  Patient coming from: Home  Chief Complaint:  Chief Complaint  Patient presents with   Dysphagia   HPI: Diamond Rice is a 51 y.o. female with medical history significant of dysphagia, achalasia with plan for surgical intervention today at [facility], COPD, morbid obesity, migraine headaches, obstructive sleep apnea, type 2 diabetes, history of CVA with residual weakness who was brought to ER secondary to diversion from [facility].  Patient has been having intractable nausea with vomiting.  This been going on for a while which is why she was scheduled for intervention.  Her symptoms are severe that she is unable to keep anything down.  She has become dehydrated.  From the ER a call was placed by Dr. Quentin Cornwall to the physicians at [facility].  Due to lack of bed availability they have recommended that patient be admitted here mainly for supportive care including IV fluids and nausea control until a bed opens up at [facility].  They plan to get patient tomorrow morning possibly directly to the operating theater for intervention.  Patient unfortunately cannot go home with the symptoms at the moment.  No hematemesis no melena.  No hematochezia.  She is hypokalemic otherwise.  Review of Systems: As mentioned in the history of present illness. All other systems reviewed and are negative. Past Medical History:  Diagnosis Date   Acute ischemic stroke ([place]) 12/27/2020   Acute renal failure (ARF) ([place]) 04/02/2018   Anxiety    Chronic lower back pain 04/17/2012   "just got over some; catched when I walked"   COPD (chronic obstructive pulmonary disease) ([place])    Critical lower limb ischemia ([place]) 05/27/2018   Depression    Headache(784.0) 04/17/2012   "~ qod; lately waking up in am w/one"   High cholesterol     Hypertension    Migraines 04/17/2012   Obesity    Sleep apnea    CPAP   Stroke ([place])    Type II diabetes mellitus ([place]) 08/28/2002   Past Surgical History:  Procedure Laterality Date   ABDOMINAL AORTOGRAM W/LOWER EXTREMITY Left 05/27/2018   Procedure: ABDOMINAL AORTOGRAM W/LOWER EXTREMITY [place] and Possible Intervention;  Surgeon: Waynetta Sandy, MD;  Location: [place] [facility];  Service: Cardiovascular;  Laterality: Left;   APPLICATION OF WOUND VAC Left 06/06/2018   Procedure: APPLICATION OF WOUND VAC;  Surgeon: Waynetta Sandy, MD;  Location: [place];  Service: Vascular;  Laterality: Left;   BALLOON DILATION N/A 12/31/2020   Procedure: [place];  Surgeon: Ronnette Juniper, MD;  Location: [place];  Service: Gastroenterology;  Laterality: N/A;   BIOPSY  04/04/2018   Procedure: BIOPSY;  Surgeon: [name], MD;  Location: [facility] ENDOSCOPY;  Service: Endoscopy;;   BIOPSY  10/27/2020   Procedure: BIOPSY;  Surgeon: Arta Silence, MD;  Location: [facility] ENDOSCOPY;  Service: Endoscopy;;   BOTOX INJECTION N/A 12/31/2020   Procedure: BOTOX INJECTION;  Surgeon: Ronnette Juniper, MD;  Location: [place];  Service: Gastroenterology;  Laterality: N/A;   CARDIAC CATHETERIZATION  ~ 2011   COLONOSCOPY N/A 04/04/2018   Procedure: COLONOSCOPY;  Surgeon: [name], MD;  Location: [facility] ENDOSCOPY;  Service: Endoscopy;  Laterality: N/A;   COLONOSCOPY WITH PROPOFOL N/A 10/27/2020   Procedure: COLONOSCOPY WITH PROPOFOL;  Surgeon: Arta Silence, MD;  Location: [facility] ENDOSCOPY;  Service:  Endoscopy;  Laterality: N/A;   ESOPHAGOGASTRODUODENOSCOPY N/A 12/31/2020   Procedure: ESOPHAGOGASTRODUODENOSCOPY (EGD);  Surgeon: Ronnette Juniper, MD;  Location: [place];  Service: Gastroenterology;  Laterality: N/A;   ESOPHAGOGASTRODUODENOSCOPY (EGD) WITH PROPOFOL N/A 10/27/2020   Procedure: ESOPHAGOGASTRODUODENOSCOPY (EGD) WITH PROPOFOL  WITH POSSIBLE DIL;  Surgeon: Arta Silence, MD;  Location: [facility] ENDOSCOPY;   Service: Endoscopy;  Laterality: N/A;   ESOPHAGOGASTRODUODENOSCOPY (EGD) WITH PROPOFOL N/A 10/19/2021   Procedure: ESOPHAGOGASTRODUODENOSCOPY (EGD) WITH PROPOFOL;  Surgeon: Lin Landsman, MD;  Location: [place];  Service: Gastroenterology;  Laterality: N/A;   FOREIGN BODY REMOVAL  12/31/2020   Procedure: FOREIGN BODY REMOVAL;  Surgeon: Ronnette Juniper, MD;  Location: [facility] ENDOSCOPY;  Service: Gastroenterology;;   LOWER EXTREMITY ANGIOGRAM Left 05/27/2018   PERIPHERAL VASCULAR INTERVENTION Left 05/27/2018   Procedure: PERIPHERAL VASCULAR INTERVENTION;  Surgeon: Waynetta Sandy, MD;  Location: [place] [facility];  Service: Cardiovascular;  Laterality: Left;   POLYPECTOMY  04/04/2018   Procedure: POLYPECTOMY;  Surgeon: [name], MD;  Location: [facility] ENDOSCOPY;  Service: Endoscopy;;   POLYPECTOMY  10/27/2020   Procedure: POLYPECTOMY;  Surgeon: Arta Silence, MD;  Location: [facility] ENDOSCOPY;  Service: Endoscopy;;   TRANSMETATARSAL AMPUTATION Left 05/28/2018   Procedure: AMPUTATION TOES THREE, FOUR AND FIVE on left foot;  Surgeon: Waynetta Sandy, MD;  Location: [place];  Service: Vascular;  Laterality: Left;   WOUND DEBRIDEMENT Left 06/06/2018   Procedure: DEBRIDEMENT WOUND LEFT FOOT;  Surgeon: Waynetta Sandy, MD;  Location: [place];  Service: Vascular;  Laterality: Left;   Social History:  reports that she has been smoking cigarettes. She has a 7.00 pack-year smoking history. She has never used smokeless tobacco. She reports current drug use. Drugs: Marijuana and "Crack" cocaine. She reports that she does not drink alcohol.  Allergies  Allergen Reactions   Morphine And Related Hives   Metformin Diarrhea and Other (See Comments)    "Allergic," per [facility]    Family History  Problem Relation Age of Onset   Stroke Brother 78   Stroke Brother 27   Heart attack Mother 21   Stroke Father 82    Prior to Admission medications   Medication Sig Start Date End Date Taking?  Authorizing Provider  ACCU-CHEK GUIDE test strip 2 (two) times daily. as directed 02/06/20   [provider]  Accu-Chek Softclix Lancets lancets 2 (two) times daily. 02/06/20   [provider]  albuterol (PROVENTIL) (2.5 MG/3ML) 0.083% nebulizer solution Take 3 mLs (2.5 mg total) by nebulization every 4 (four) hours as needed for wheezing or shortness of breath. 07/29/21 07/29/22  Duffy Bruce, MD  amLODipine (NORVASC) 10 MG tablet Take 1 tablet (10 mg total) by mouth daily. 01/22/20   Royal Hawthorn, NP  aspirin EC 81 MG EC tablet Take 1 tablet (81 mg total) by mouth daily. 09/21/18   Donzetta Starch, NP  atorvastatin (LIPITOR) 40 MG tablet Take 2 tablets (80 mg total) by mouth daily at 6 PM. 01/03/21   Hongalgi, Lenis Dickinson, MD  Blood Glucose Monitoring Suppl (ACCU-CHEK GUIDE) w/Device KIT USE AS DIRECTED TO CHECK BLOOD SUGAR 02/06/20   [provider]  carboxymethylcellulose (REFRESH PLUS) 0.5 % SOLN 1 drop at bedtime.    [provider]  carvedilol (COREG) 12.5 MG tablet Take 12.5 mg by mouth 2 (two) times daily with a meal.    [provider]  cloNIDine (CATAPRES - DOSED IN MG/24 HR) 0.3 mg/24hr patch 0.3 mg once a week. 11/08/21   [provider]  famotidine (PEPCID) 40 MG tablet Take 40 mg by mouth daily. 12/15/21   [provider]  FLUoxetine (PROZAC) 20 MG capsule Take 20 mg by mouth daily. 12/26/21   [provider]  hydrALAZINE (APRESOLINE) 100 MG tablet Take 1 tablet (100 mg total) by mouth 3 (three) times daily. 01/22/20   Royal Hawthorn, NP  insulin aspart (NOVOLOG) 100 UNIT/ML injection Inject 0-9 Units into the skin 3 (three) times daily with meals. Correction coverage: Sensitive (thin, NPO, renal) CBG < 70: Implement Hypoglycemia protocol. CBG 70 - 120: 0 units CBG 121 - 150: 1 unit CBG 151 - 200: 2 units CBG 201 - 250: 3 units CBG 251 - 300: 5 units CBG 301 - 350: 7 units CBG 351 - 400: 9 units CBG > 400: call MD.  01/03/21   Modena Jansky, MD  insulin glargine (LANTUS) 100 UNIT/ML injection Inject 0.1 mLs (10 Units total) into the skin daily. 01/21/21   Nolberto Hanlon, MD  Insulin lispro (HUMALOG JUNIOR KWIKPEN) 100 UNIT/ML Inject into the skin. Sliding scale    [provider]  lisinopril (ZESTRIL) 40 MG tablet Take 1 tablet (40 mg total) by mouth daily. 01/22/20   Royal Hawthorn, NP  magnesium hydroxide (MILK OF MAGNESIA) 400 MG/5ML suspension Take by mouth daily as needed for mild constipation.    [provider]  Melatonin 10 MG CAPS Take by mouth.    [provider]  mirtazapine (REMERON) 30 MG tablet Take 30 mg by mouth at bedtime.    [provider]  ondansetron (ZOFRAN-ODT) 4 MG disintegrating tablet Take 1 tablet (4 mg total) by mouth every 6 (six) hours as needed for nausea or vomiting. 02/23/22   Ward, Delice Bison, DO  pantoprazole (PROTONIX) 40 MG tablet Take 1 tablet (40 mg total) by mouth daily for 14 days. 02/18/22 03/04/22  Duffy Bruce, MD  senna (SENOKOT) 8.6 MG tablet Take 1 tablet by mouth daily.    [provider]  sucralfate (CARAFATE) 1 g tablet Take 1 tablet (1 g total) by mouth 4 (four) times daily -  with meals and at bedtime for 7 days. 02/18/22 02/25/22  Duffy Bruce, MD  traMADol (ULTRAM) 50 MG tablet Take by mouth every 6 (six) hours as needed.    [provider]  TRULICITY 3.49 ZP/9.1TA SOPN Inject into the skin. 08/10/21   [provider]    Physical Exam: Vitals:   05/31/22 1446 05/31/22 1455 05/31/22 1457 05/31/22 1843  BP:   138/81 (!) 141/86  Pulse:   80 79  Resp:   16 15  Temp:   99.1 F (37.3 C) 98.8 F (37.1 C)  TempSrc: Oral  Oral Oral  SpO2:   96% 98%  Weight:  105 kg    Height:  $Remove'5\' 4"'uFqReNG$  (1.626 m)    Constitutional: Morbidly obese, no distress NAD, calm, comfortable Eyes: Dry mucous membranes PERRL, lids and conjunctivae normal ENMT: Mucous membranes are moist. Posterior pharynx clear of any exudate  or lesions.Normal dentition.  Neck: normal, supple, no masses, no thyromegaly Respiratory: clear to auscultation bilaterally, no wheezing, no crackles. Normal respiratory effort. No accessory muscle use.  Cardiovascular: Regular rate and rhythm, no murmurs / rubs / gallops. No extremity edema. 2+ pedal pulses. No carotid bruits.  Abdomen: no tenderness, no masses palpated. No hepatosplenomegaly. Bowel sounds positive.  Musculoskeletal: Good range of motion, no joint swelling or tenderness, Skin: no rashes, lesions, ulcers. No induration Neurologic: CN 2-12 grossly  intact. Sensation intact, DTR normal. Strength 5/5 in all 4.  Psychiatric: Normal judgment and insight. Alert and oriented x 3. Normal mood  Data Reviewed:  Potassium is 3.1, hemoglobin 11.6, chest x-ray showed strandy opacity in the right lung base and stable cardiomegaly.  Assessment and Plan:   #1 intractable nausea vomiting due to achalasia: Patient will be admitted for observation.  Supportive care mainly focusing on IV hydration as well as nausea and vomiting control.  Patient will likely transfer to [facility] in the morning.  They have requested that the morning hospitalist call to initiate the transfer.  #2 dehydration: Hydrate patient and monitor  #3 hypokalemia: Replete potassium  #4 diabetes: Confirm home regimen and initiate sliding scale insulin.  She is non insulin-dependent.  #5 morbid obesity: Counseling.  #6 chronic kidney disease stage III: Appears to be at baseline.  Continue to monitor  #7 polysubstance abuse history: Follow urine drug screen.  Monitor  #8 history of CVA: Resume home regimen as soon as practicable.      Advance Care Planning:   Code Status: Prior full code  Consults: At [facility]  Family Communication: No family at bedside  Severity of Illness: The appropriate patient status for this patient is OBSERVATION. Observation status is judged to be reasonable and necessary  in order to provide the required intensity of service to ensure the patient's safety. The patient's presenting symptoms, physical exam findings, and initial radiographic and laboratory data in the context of their medical condition is felt to place them at decreased risk for further clinical deterioration. Furthermore, it is anticipated that the patient will be medically stable for discharge from the hospital within 2 midnights of admission.   AuthorBarbette Merino, MD 05/31/2022 7:03 PM  For on call review [email or web address].

## 2022-05-31 NOTE — ED Provider Notes (Signed)
[facility] Provider Note    Event Date/Time   First MD Initiated Contact with Patient 05/31/22 1644     (approximate)   History   Dysphagia   HPI  Diamond Rice is a 51 y.o. female with a history of CVA, esophageal dysmotility, achalasia presents to the ER for evaluation of poor p.o. intake and chest discomfort feeling like food is stuck in her throat.  The symptoms have been gone going for several weeks.  She supposed to follow-up with outpatient GI but has not been seen in clinic yet.  She denies any lower abdominal pain.  Has been feeling nauseated since she was discharged.  Feels similar to previous visits to the ER.  No measured fevers or chills.     Physical Exam   Triage Vital Signs: ED Triage Vitals  Enc Vitals Group     BP 05/31/22 1457 138/81     Pulse Rate 05/31/22 1457 80     Resp 05/31/22 1457 16     Temp 05/31/22 1457 99.1 F (37.3 C)     Temp Source 05/31/22 1446 Oral     SpO2 05/31/22 1457 96 %     Weight 05/31/22 1455 231 lb 7.7 oz (105 kg)     Height 05/31/22 1455 '5\' 4"'$  (1.626 m)     Head Circumference --      Peak Flow --      Pain Score 05/31/22 1455 10     Pain Loc --      Pain Edu? --      Excl. in [place]? --     Most recent vital signs: Vitals:   05/31/22 1457 05/31/22 1843  BP: 138/81 (!) 141/86  Pulse: 80 79  Resp: 16 15  Temp: 99.1 F (37.3 C) 98.8 F (37.1 C)  SpO2: 96% 98%     Constitutional: Alert  Eyes: Conjunctivae are normal.  Head: Atraumatic. Nose: No congestion/rhinnorhea. Mouth/Throat: Mucous membranes are moist.   Neck: Painless ROM.  Cardiovascular:   Good peripheral circulation. Respiratory: Normal respiratory effort.  No retractions.  Gastrointestinal: Soft with mild epigastric tenderness to palpation. Musculoskeletal:  no deformity Neurologic:  MAE spontaneously. No gross focal neurologic deficits are appreciated.  Skin:  Skin is warm, dry and intact. No rash noted. Psychiatric: Mood  and affect are normal. Speech and behavior are normal.    ED Results / Procedures / Treatments   Labs (all labs ordered are listed, but only abnormal results are displayed) Labs Reviewed  COMPREHENSIVE METABOLIC PANEL - Abnormal; Notable for the following components:      Result Value   Potassium 3.1 (*)    Albumin 3.1 (*)    All other components within normal limits  CBC - Abnormal; Notable for the following components:   RBC 5.42 (*)    Hemoglobin 11.6 (*)    MCV 70.3 (*)    MCH 21.4 (*)    RDW 18.9 (*)    All other components within normal limits  LIPASE, BLOOD  URINALYSIS, ROUTINE W REFLEX MICROSCOPIC  TROPONIN I (HIGH SENSITIVITY)  TROPONIN I (HIGH SENSITIVITY)     EKG  ED ECG REPORT I, Merlyn Lot, the attending physician, personally viewed and interpreted this ECG.   Date: 05/31/2022  EKG Time: 17:52  Rate: 75  Rhythm: sinus  Axis: normal  Intervals:normal  ST&T Change: no stemi, no depressions    RADIOLOGY Please see ED Course for my review and interpretation.  I personally reviewed  all radiographic images ordered to evaluate for the above acute complaints and reviewed radiology reports and findings.  These findings were personally discussed with the patient.  Please see medical record for radiology report.     PROCEDURES:  Critical Care performed:   Procedures   MEDICATIONS ORDERED IN ED: Medications  alum & mag hydroxide-simeth (MAALOX/MYLANTA) 200-200-20 MG/5ML suspension 30 mL (30 mLs Oral Patient Refused/Not Given 05/31/22 1720)  lactated ringers infusion ( Intravenous New Bag/Given 05/31/22 1855)  iohexol (OMNIPAQUE) 300 MG/ML solution 100 mL (100 mLs Intravenous Contrast Given 05/31/22 1729)  ondansetron (ZOFRAN) injection 4 mg (4 mg Intravenous Given 05/31/22 1911)     IMPRESSION / MDM / ASSESSMENT AND PLAN / ED COURSE  I reviewed the triage vital signs and the nursing notes.                              Differential diagnosis  includes, but is not limited to, esophagitis, food bolus impaction, gastritis, SBO, ACS, pneumothorax, pleurisy doubt biliary pathology, mediastinitis Boerhaave's  Patient presenting to the ER for evaluation of symptoms as described above.  Base on symptoms, risk factors and considered above differential, this presenting complaint could reflect a potentially life-threatening illness therefore the patient will be placed on continuous pulse oximetry and telemetry for monitoring.  Laboratory evaluation will be sent to evaluate for the above complaints.  Blood work is reassuring.  Appears well perfused.  Does have some epigastric pain.  Given her history with dilated esophagus with worsening pain concern for worsening acute intra-abdominal process.  Have a lower suspicion for mediastinitis or perforation.    Clinical Course as of 05/31/22 1913  Wed May 31, 2022  1745 CT imaging my review and interpretation does not show any evidence of SBO.  Will await formal radiology report. [PR]  3818 CT imaging with persistent findings concerning for achalasia.  Patient likely needs a dilatation procedure which GI at this facility is unable to provide.  Will consult GI at [facility] who made the diagnosis of achalasia and is following patient to evaluate for transfer. [PR]  [address] is currently on diversion.  I spoke with Dr. Pleas Patricia of hospitalist group at [facility] who will help arrange transfer tomorrow for day trip for GI evaluation given her persistent achalasia.  Confirmed with our GI team here that this is not a procedure that can be done at her facility.  She is currently tolerating her secretions this does appear secondary to chronic achalasia but is gone when she is not tolerating her medications.  Discussed case in consultation with hospitalist for admission. [PR]    Clinical Course User Index [PR] Merlyn Lot, MD   FINAL CLINICAL IMPRESSION(S) / ED DIAGNOSES   Final diagnoses:  Achalasia of esophagus   Intractable nausea and vomiting     Rx / DC Orders   ED Discharge Orders     None        Note:  This document was prepared using Dragon voice recognition software and may include unintentional dictation errors.    Merlyn Lot, MD 05/31/22 [phone]

## 2022-06-01 DIAGNOSIS — E1151 Type 2 diabetes mellitus with diabetic peripheral angiopathy without gangrene: Secondary | ICD-10-CM | POA: Diagnosis present

## 2022-06-01 DIAGNOSIS — T18128A Food in esophagus causing other injury, initial encounter: Secondary | ICD-10-CM | POA: Diagnosis present

## 2022-06-01 DIAGNOSIS — Y929 Unspecified place or not applicable: Secondary | ICD-10-CM | POA: Diagnosis not present

## 2022-06-01 DIAGNOSIS — Z885 Allergy status to narcotic agent status: Secondary | ICD-10-CM | POA: Diagnosis not present

## 2022-06-01 DIAGNOSIS — I129 Hypertensive chronic kidney disease with stage 1 through stage 4 chronic kidney disease, or unspecified chronic kidney disease: Secondary | ICD-10-CM | POA: Diagnosis present

## 2022-06-01 DIAGNOSIS — G4733 Obstructive sleep apnea (adult) (pediatric): Secondary | ICD-10-CM | POA: Diagnosis present

## 2022-06-01 DIAGNOSIS — E7800 Pure hypercholesterolemia, unspecified: Secondary | ICD-10-CM | POA: Diagnosis present

## 2022-06-01 DIAGNOSIS — K220 Achalasia of cardia: Secondary | ICD-10-CM | POA: Diagnosis present

## 2022-06-01 DIAGNOSIS — Z8249 Family history of ischemic heart disease and other diseases of the circulatory system: Secondary | ICD-10-CM | POA: Diagnosis not present

## 2022-06-01 DIAGNOSIS — F419 Anxiety disorder, unspecified: Secondary | ICD-10-CM | POA: Diagnosis present

## 2022-06-01 DIAGNOSIS — Z6841 Body Mass Index (BMI) 40.0 and over, adult: Secondary | ICD-10-CM | POA: Diagnosis not present

## 2022-06-01 DIAGNOSIS — E1122 Type 2 diabetes mellitus with diabetic chronic kidney disease: Secondary | ICD-10-CM | POA: Diagnosis present

## 2022-06-01 DIAGNOSIS — Z823 Family history of stroke: Secondary | ICD-10-CM | POA: Diagnosis not present

## 2022-06-01 DIAGNOSIS — Z89422 Acquired absence of other left toe(s): Secondary | ICD-10-CM | POA: Diagnosis not present

## 2022-06-01 DIAGNOSIS — N1831 Chronic kidney disease, stage 3a: Secondary | ICD-10-CM | POA: Diagnosis present

## 2022-06-01 DIAGNOSIS — Z794 Long term (current) use of insulin: Secondary | ICD-10-CM | POA: Diagnosis not present

## 2022-06-01 DIAGNOSIS — E876 Hypokalemia: Secondary | ICD-10-CM | POA: Diagnosis present

## 2022-06-01 DIAGNOSIS — I69398 Other sequelae of cerebral infarction: Secondary | ICD-10-CM | POA: Diagnosis not present

## 2022-06-01 DIAGNOSIS — F1910 Other psychoactive substance abuse, uncomplicated: Secondary | ICD-10-CM | POA: Diagnosis present

## 2022-06-01 DIAGNOSIS — E860 Dehydration: Secondary | ICD-10-CM | POA: Diagnosis present

## 2022-06-01 DIAGNOSIS — F32A Depression, unspecified: Secondary | ICD-10-CM | POA: Diagnosis present

## 2022-06-01 DIAGNOSIS — K269 Duodenal ulcer, unspecified as acute or chronic, without hemorrhage or perforation: Secondary | ICD-10-CM | POA: Diagnosis present

## 2022-06-01 DIAGNOSIS — F17210 Nicotine dependence, cigarettes, uncomplicated: Secondary | ICD-10-CM | POA: Diagnosis present

## 2022-06-01 DIAGNOSIS — J449 Chronic obstructive pulmonary disease, unspecified: Secondary | ICD-10-CM | POA: Diagnosis present

## 2022-06-01 LAB — COMPREHENSIVE METABOLIC PANEL
ALT: 8 U/L (ref 0–44)
AST: 13 U/L — ABNORMAL LOW (ref 15–41)
Albumin: 2.8 g/dL — ABNORMAL LOW (ref 3.5–5.0)
Alkaline Phosphatase: 63 U/L (ref 38–126)
Anion gap: 9 (ref 5–15)
BUN: 12 mg/dL (ref 6–20)
CO2: 25 mmol/L (ref 22–32)
Calcium: 8.8 mg/dL — ABNORMAL LOW (ref 8.9–10.3)
Chloride: 109 mmol/L (ref 98–111)
Creatinine, Ser: 0.83 mg/dL (ref 0.44–1.00)
GFR, Estimated: >60 mL/min (ref >60)
Glucose, Bld: 89 mg/dL (ref 70–99)
Potassium: 3.3 mmol/L — ABNORMAL LOW (ref 3.5–5.1)
Sodium: 143 mmol/L (ref 135–145)
Total Bilirubin: 1.3 mg/dL — ABNORMAL HIGH (ref 0.3–1.2)
Total Protein: 7.1 g/dL (ref 6.5–8.1)

## 2022-06-01 LAB — URINE DRUG SCREEN, QUALITATIVE (ARMC ONLY)
Amphetamines, Ur Screen: NOT DETECTED
Barbiturates, Ur Screen: NOT DETECTED
Benzodiazepine, Ur Scrn: NOT DETECTED
Cannabinoid 50 Ng, Ur ~~LOC~~: POSITIVE — AB
Cocaine Metabolite,Ur ~~LOC~~: NOT DETECTED
MDMA (Ecstasy)Ur Screen: NOT DETECTED
Methadone Scn, Ur: NOT DETECTED
Opiate, Ur Screen: NOT DETECTED
Phencyclidine (PCP) Ur S: NOT DETECTED
Tricyclic, Ur Screen: NOT DETECTED

## 2022-06-01 LAB — URINALYSIS, ROUTINE W REFLEX MICROSCOPIC
Bilirubin Urine: NEGATIVE
Glucose, UA: NEGATIVE mg/dL
Hgb urine dipstick: NEGATIVE
Ketones, ur: 20 mg/dL — AB
Leukocytes,Ua: NEGATIVE
Nitrite: POSITIVE — AB
Protein, ur: NEGATIVE mg/dL
Specific Gravity, Urine: >1.046 — ABNORMAL HIGH (ref 1.005–1.030)
pH: 5 (ref 5.0–8.0)

## 2022-06-01 LAB — HEMOGLOBIN A1C
Hgb A1c MFr Bld: 7.1 % — ABNORMAL HIGH (ref 4.8–5.6)
Mean Plasma Glucose: 157.07 mg/dL

## 2022-06-01 LAB — CBC
HCT: 33.8 % — ABNORMAL LOW (ref 36.0–46.0)
Hemoglobin: 10.3 g/dL — ABNORMAL LOW (ref 12.0–15.0)
MCH: 21.5 pg — ABNORMAL LOW (ref 26.0–34.0)
MCHC: 30.5 g/dL (ref 30.0–36.0)
MCV: 70.6 fL — ABNORMAL LOW (ref 80.0–100.0)
Platelets: 253 10*3/uL (ref 150–400)
RBC: 4.79 MIL/uL (ref 3.87–5.11)
RDW: 18.8 % — ABNORMAL HIGH (ref 11.5–15.5)
WBC: 8.1 10*3/uL (ref 4.0–10.5)
nRBC: 0 % (ref 0.0–0.2)

## 2022-06-01 LAB — HIV ANTIBODY (ROUTINE TESTING W REFLEX): HIV Screen 4th Generation wRfx: NONREACTIVE

## 2022-06-01 LAB — GLUCOSE, CAPILLARY
Glucose-Capillary: 76 mg/dL (ref 70–99)
Glucose-Capillary: 81 mg/dL (ref 70–99)

## 2022-06-01 LAB — MAGNESIUM: Magnesium: 1.9 mg/dL (ref 1.7–2.4)

## 2022-06-01 MED ORDER — INSULIN ASPART 100 UNIT/ML IJ SOLN
0.0000 [IU] | INTRAMUSCULAR | Status: DC
  Administered 2022-06-02: 2 [IU] via SUBCUTANEOUS
  Administered 2022-06-03: 3 [IU] via SUBCUTANEOUS
  Administered 2022-06-03: 2 [IU] via SUBCUTANEOUS
  Administered 2022-06-04: 3 [IU] via SUBCUTANEOUS
  Administered 2022-06-04 – 2022-06-05 (×2): 2 [IU] via SUBCUTANEOUS
  Filled 2022-06-01 (×6): qty 1

## 2022-06-01 MED ORDER — CLONIDINE HCL 0.3 MG/24HR TD PTWK
0.3000 mg | MEDICATED_PATCH | TRANSDERMAL | Status: DC
  Administered 2022-06-01: 0.3 mg via TRANSDERMAL
  Filled 2022-06-01: qty 1

## 2022-06-01 MED ORDER — HYDRALAZINE HCL 20 MG/ML IJ SOLN
10.0000 mg | Freq: Four times a day (QID) | INTRAMUSCULAR | Status: DC | PRN
  Administered 2022-06-01 – 2022-06-02 (×3): 10 mg via INTRAVENOUS
  Filled 2022-06-01 (×3): qty 1

## 2022-06-01 MED ORDER — SODIUM CHLORIDE 0.9 % IV SOLN
25.0000 mg | Freq: Four times a day (QID) | INTRAVENOUS | Status: DC | PRN
  Administered 2022-06-01 – 2022-06-05 (×8): 25 mg via INTRAVENOUS
  Filled 2022-06-01 (×7): qty 1

## 2022-06-01 NOTE — Assessment & Plan Note (Signed)
Estimated body mass index is 44.49 kg/m as calculated from the following:   Height as of this encounter: '5\' 1"'$  (1.549 m).   Weight as of this encounter: 106.8 kg.   -This will complicate overall prognosis

## 2022-06-01 NOTE — TOC Initial Note (Signed)
Transition of Care [facility]) - Initial/Assessment Note    Patient Details  Name: Diamond Rice MRN: [id number] Date of Birth: [birth year]  Transition of Care [facility]) CM/SW Contact:    Laurena Slimmer, RN Phone Number: 06/01/2022, 2:04 PM  Clinical Narrative:                  Transition of Care [facility]) Screening Note   Patient Details  Name: Diamond Rice Date of Birth: [birth year]   Transition of Care [facility]) CM/SW Contact:    Laurena Slimmer, RN Phone Number: 06/01/2022, 2:04 PM    Transition of Care Department [facility]) has reviewed patient and no TOC needs have been identified at this time. We will continue to monitor patient advancement through interdisciplinary progression rounds. If new patient transition needs arise, please place a TOC consult.          Patient Goals and CMS Choice        Expected Discharge Plan and Services                                                Prior Living Arrangements/Services                       Activities of Daily Living Home Assistive Devices/Equipment: Wheelchair ADL Screening (condition at time of admission) Patient's cognitive ability adequate to safely complete daily activities?: Yes Is the patient deaf or have difficulty hearing?: No Does the patient have difficulty seeing, even when wearing glasses/contacts?: No Does the patient have difficulty concentrating, remembering, or making decisions?: No Patient able to express need for assistance with ADLs?: Yes Does the patient have difficulty dressing or bathing?: Yes Independently performs ADLs?: No Communication: Independent Dressing (OT): Needs assistance Is this a change from baseline?: Pre-admission baseline Grooming: Needs assistance Is this a change from baseline?: Pre-admission baseline Feeding: Independent Bathing: Needs assistance Is this a change from baseline?: Pre-admission baseline Toileting: Needs assistance Is this a change from  baseline?: Pre-admission baseline In/Out Bed: Needs assistance Is this a change from baseline?: Pre-admission baseline Walks in Home: Needs assistance Is this a change from baseline?: Pre-admission baseline Does the patient have difficulty walking or climbing stairs?: Yes Weakness of Legs: Left Weakness of Arms/Hands: Left  Permission Sought/Granted                  Emotional Assessment              Admission diagnosis:  Achalasia of esophagus [K22.0] Intractable nausea and vomiting [R11.2] Patient Active Problem List   Diagnosis Date Noted   Achalasia of esophagus 05/31/2022   Pain of lower extremity 12/19/2021   Achalasia 08/17/2021   Dysphagia 08/17/2021   Acute CVA (cerebrovascular accident) ([place]) 01/18/2021   Dysarthria    Acute lower UTI    Abnormal finding on lung imaging 12/27/2020   Severe episode of recurrent major depressive disorder, with psychotic features ([place])    Cerebrovascular accident (CVA) ([place]) 12/06/2019   AKI (acute kidney injury) ([place]) 12/06/2019   LVH (left ventricular hypertrophy) 08/10/2019   Edema of lower extremity 11/26/2018   Loss of appetite 11/26/2018   Urinary incontinence 11/26/2018   Allergic rhinitis 11/14/2018   Deep venous thrombosis ([place]) 11/14/2018   Nicotine dependence 11/14/2018   Hemorrhagic stroke ([place])    Diastolic dysfunction  Type 2 diabetes mellitus (HCC)    Anemia of chronic disease    Chronic obstructive pulmonary disease (HCC)    ICH (intracerebral hemorrhage) ([place]) 08/30/2018   Hypertensive heart disease without heart failure 07/04/2018   Nonrheumatic aortic valve insufficiency 07/04/2018   Esophageal abnormality    Class 2 severe obesity due to excess calories with serious comorbidity and body mass index (BMI) of 35.0 to 35.9 in adult [facility])    Peripheral vascular disease ([place])    History of osteomyelitis    History of amputation of lesser toe (HCC)    Pulmonary embolism ([place]) 06/22/2018   Microcytic  anemia 06/17/2018   At risk for adverse drug reaction 06/11/2018   Critical lower limb ischemia (HCC) 05/27/2018   GI bleeding 04/02/2018   Hypokalemia 04/02/2018   Polysubstance abuse ([place]) 04/02/2018   Diabetic foot ulcer ([place]) 04/02/2018   Tinea pedis 03/07/2018   Diabetic peripheral neuropathy ([place]) 01/24/2018   Cannabis use disorder, moderate, dependence ([place]) 05/19/2017   Cocaine use disorder, moderate, dependence ([place]) 05/19/2017   CKD (chronic kidney disease) stage 3, GFR 30-59 ml/min ([place]) 02/17/2017   Noncompliance with medications 02/17/2017   Suicide attempt ([place]) 11/06/2016   Mild cognitive impairment 03/21/2016   Moderate episode of recurrent major depressive disorder ([place]) 03/21/2016   History of CVA with residual deficit 12/30/2015   Schizoaffective disorder, bipolar type ([place]) 10/07/2015   OSA (obstructive sleep apnea) 06/09/2015   Vitamin D deficiency 01/06/2015   Bilateral knee pain 01/05/2015   Numbness and tingling in right hand 01/05/2015   Insomnia 12/07/2014   Right-sided lacunar infarction ([place]) 09/15/2014   Left hemiparesis ([place]) 09/15/2014   Dyspnea    Back pain 09/11/2013   Psychoactive substance-induced organic mood disorder ([place]) 05/28/2013   Cocaine abuse with cocaine-induced mood disorder ([place]) 05/28/2013   Cannabis dependence with cannabis-induced anxiety disorder ([place]) 05/28/2013   Morbid obesity with BMI of 45.0-49.9, adult ([place]) 04/25/2012   Chest pain 04/17/2012   Hypertension 04/17/2012   DM (diabetes mellitus) with peripheral vascular complication ([place]) 38/05/1750   Hyperlipidemia 04/17/2012   Tobacco use 04/17/2012   PCP:  System, Provider Not In Pharmacy:   [facility], [place] [address] [place] [address] Phone: [phone] Fax: [phone]     Social Determinants of Health (SDOH) Interventions    Readmission Risk Interventions    01/20/2021    8:22 AM  Readmission  Risk Prevention Plan  Palliative Care Screening Not Applicable  Skilled Nursing Facility Complete

## 2022-06-01 NOTE — Assessment & Plan Note (Signed)
No concern of acute exacerbation. -Continue to monitor -As needed bronchodilator

## 2022-06-01 NOTE — Assessment & Plan Note (Signed)
Most likely secondary to GI losses.  Potassium at 3.3 with magnesium of 1.9. -Replace potassium and monitor

## 2022-06-01 NOTE — Hospital Course (Addendum)
Taken from H&P.  Diamond Rice is a 51 y.o. female with medical history significant of dysphagia, achalasia with plan for surgical intervention today at [facility], COPD, morbid obesity, migraine headaches, obstructive sleep apnea, type 2 diabetes, history of CVA with residual weakness who was brought to ER secondary to diversion from [facility].  Patient has been having intractable nausea with vomiting.  This been going on for a while which is why she was scheduled for intervention.  Her symptoms are severe that she is unable to keep anything down.  She has become dehydrated.  From the ER a call was placed by Dr. Quentin Cornwall to the physicians at [facility].  Due to lack of bed availability they have recommended that patient be admitted here mainly for supportive care including IV fluids and nausea control until a bed opens up at [facility].  They plan to get patient tomorrow morning possibly directly to the operating theater for intervention.  Patient unfortunately cannot go home with the symptoms at the moment.  Patient was found to be hypokalemic, potassium being repleted and she was started on IV fluid due to dehydration.  10/5: Potassium at 3.3, T. bili of 1.3.  CBC seems stable around his baseline.  Continue to have nausea and vomiting. Still no bed availability at [facility].  10/6: After making another phone call to [facility] today, only to find out that they will not accept her as imaging appears to have some food impaction.  [facility] wants to see her as an outpatient for pneumatic dilatation as they do not think there is an emergent need.  Talk with our GI and appreciate Dr. Vicente Males who proceed with EGD to help this lady who were unable to take any p.o. with resistant vomiting with all p.o. intake.  EGD shows thick material in the lower esophagus which was removed, multiple clean-based duodenal ulcers which were biopsied and bilious fluid in stomach.  Patient tolerated the procedure well.  She will  be started on clear liquid diet after the procedure.  10/7: Continue to complain about nausea with any p.o. intake, no vomiting.  Need encouragement to drink some Ensure or boost so she can be discharged back to her facility.  She has to see [facility] as an outpatient for definitive treatment of her esophageal achalasia.  10/8: Improved nausea and vomiting today.  Still not very good appetite but able to drink some juices and Ensure. Should be able to go back to her facility so she can follow-up with her [facility] as an outpatient ASAP.  10/9: Patient remained stable.  Able to tolerate liquid, drinking Ensure or boost.  She should encourage to continue drinking Ensure or boost in small quantities at a time to meet the nutritional requirement and prevent symptoms.  No marijuana as it causes worsening of her symptoms. We also added Reglan 3 times daily to help with motility. Patient need to see her [facility] ASAP for definitive treatment of her achalasia.  Patient was on multiple medications belong to the same class, we cleaned some of her list.  She should be taking medications according to this new list and need to have a close follow-up with PCP for further management and med rec.

## 2022-06-01 NOTE — Assessment & Plan Note (Signed)
Holding home aspirin and statin due to persistent nausea and vomiting.

## 2022-06-01 NOTE — Assessment & Plan Note (Signed)
-   Every 4 hourly SSI while she is n.p.o.

## 2022-06-01 NOTE — Assessment & Plan Note (Signed)
-   Check UDS

## 2022-06-01 NOTE — Assessment & Plan Note (Signed)
History of CKD stage IIIa.  Creatinine appears to be at baseline. -Monitor renal function -Avoid nephrotoxins

## 2022-06-01 NOTE — Assessment & Plan Note (Signed)
Continue to have nausea and vomiting, unable to tolerate much p.o.  Patient need procedure which is being now postponed as [facility] still does not have any bed availability. -Continue with IV fluid -Continue with supportive care -Hopefully she can be transferred to [facility] soon to have her procedure to help with the symptoms

## 2022-06-01 NOTE — Progress Notes (Signed)
Progress Note   Patient: Diamond Rice YCX:[id number] DOB: 05-14-1971 DOA: 05/31/2022     0 DOS: the patient was seen and examined on 06/01/2022   Brief hospital course: Taken from H&P.  Mayzie Caughlin is a 51 y.o. female with medical history significant of dysphagia, achalasia with plan for surgical intervention today at [facility], COPD, morbid obesity, migraine headaches, obstructive sleep apnea, type 2 diabetes, history of CVA with residual weakness who was brought to ER secondary to diversion from [facility].  Patient has been having intractable nausea with vomiting.  This been going on for a while which is why she was scheduled for intervention.  Her symptoms are severe that she is unable to keep anything down.  She has become dehydrated.  From the ER a call was placed by Dr. Quentin Cornwall to the physicians at [facility].  Due to lack of bed availability they have recommended that patient be admitted here mainly for supportive care including IV fluids and nausea control until a bed opens up at [facility].  They plan to get patient tomorrow morning possibly directly to the operating theater for intervention.  Patient unfortunately cannot go home with the symptoms at the moment.  Patient was found to be hypokalemic, potassium being repleted and she was started on IV fluid due to dehydration.  10/5: Potassium at 3.3, T. bili of 1.3.  CBC seems stable around his baseline.  Continue to have nausea and vomiting. Still no bed availability at [facility]   Assessment and Plan: * Achalasia of esophagus Continue to have nausea and vomiting, unable to tolerate much p.o.  Patient need procedure which is being now postponed as [facility] still does not have any bed availability. -Continue with IV fluid -Continue with supportive care -Hopefully she can be transferred to [facility] soon to have her procedure to help with the symptoms  DM (diabetes mellitus) with peripheral vascular complication ([place]) -  Every 4 hourly SSI while she is n.p.o.  Hypertension Blood pressure elevated.  On multiple antihypertensives at home. Unable to take any p.o. due to persistent nausea and vomiting. -Switch clonidine with clonidine patch -As needed IV hydralazine -We will restart home medications once able to take p.o.  CKD (chronic kidney disease) stage 3, GFR 30-59 ml/min (HCC) History of CKD stage IIIa.  Creatinine appears to be at baseline. -Monitor renal function -Avoid nephrotoxins  Polysubstance abuse (HCC) - Check UDS  Chronic obstructive pulmonary disease (HCC) No concern of acute exacerbation. -Continue to monitor -As needed bronchodilator  Class 2 severe obesity due to excess calories with serious comorbidity and body mass index (BMI) of 35.0 to 35.9 in adult [facility]) Estimated body mass index is 44.49 kg/m as calculated from the following:   Height as of this encounter: '5\' 1"'$  (1.549 m).   Weight as of this encounter: 106.8 kg.   -This will complicate overall prognosis  History of CVA with residual deficit Holding home aspirin and statin due to persistent nausea and vomiting.  Hypokalemia Most likely secondary to GI losses.  Potassium at 3.3 with magnesium of 1.9. -Replace potassium and monitor   Subjective: Patient continued to have nausea and vomiting.  Physical Exam: Vitals:   05/31/22 2132 06/01/22 0443 06/01/22 0803 06/01/22 1530  BP: (!) 137/92 (!) 147/98 (!) 141/92 (!) 169/112  Pulse: 77 72 73 76  Resp: '17 18 16 14  '$ Temp: 98.8 F (37.1 C) 97.6 F (36.4 C) 97.7 F (36.5 C) 97.8 F (36.6  C)  TempSrc:      SpO2: 99% 97% 96% 99%  Weight: 106.8 kg     Height: '5\' 1"'$  (1.549 m)      General.  Obese lady, in no acute distress. Pulmonary.  Lungs clear bilaterally, normal respiratory effort. CV.  Regular rate and rhythm, no JVD, rub or murmur. Abdomen.  Soft, nontender, nondistended, BS positive. CNS.  Alert and oriented .  No focal neurologic deficit. Extremities.  No  edema, no cyanosis, pulses intact and symmetrical. Psychiatry.  Judgment and insight appears normal.  Data Reviewed: Prior data reviewed  Family Communication: Discussed with patient, unable to reach brother on phone  Disposition: Status is: Inpatient Remains inpatient appropriate because: Severity of illness   Planned Discharge Destination: Need to be transferred to [facility]  Time spent: 45 minutes  This record has been created using Systems analyst. Errors have been sought and corrected,but may not always be located. Such creation errors do not reflect on the standard of care.  Author: Lorella Nimrod, MD 06/01/2022 4:59 PM  For on call review [email or web address].

## 2022-06-01 NOTE — Assessment & Plan Note (Signed)
Blood pressure elevated.  On multiple antihypertensives at home. Unable to take any p.o. due to persistent nausea and vomiting. -Switch clonidine with clonidine patch -As needed IV hydralazine -We will restart home medications once able to take p.o.

## 2022-06-02 ENCOUNTER — Encounter
Admission: EM | Disposition: A | Discharge status: Skilled Nursing Facility | Payer: Self-pay | Source: Home / Self Care | Attending: Internal Medicine

## 2022-06-02 ENCOUNTER — Inpatient Hospital Stay: Payer: Medicare Other | Admitting: Anesthesiology

## 2022-06-02 ENCOUNTER — Encounter: Payer: Self-pay | Admitting: Internal Medicine

## 2022-06-02 DIAGNOSIS — F1910 Other psychoactive substance abuse, uncomplicated: Secondary | ICD-10-CM

## 2022-06-02 DIAGNOSIS — K220 Achalasia of cardia: Secondary | ICD-10-CM | POA: Diagnosis not present

## 2022-06-02 HISTORY — PX: ESOPHAGOGASTRODUODENOSCOPY (EGD) WITH PROPOFOL: SHX5813

## 2022-06-02 LAB — GLUCOSE, CAPILLARY
Glucose-Capillary: 134 mg/dL — ABNORMAL HIGH (ref 70–99)
Glucose-Capillary: 79 mg/dL (ref 70–99)
Glucose-Capillary: 83 mg/dL (ref 70–99)
Glucose-Capillary: 88 mg/dL (ref 70–99)
Glucose-Capillary: 89 mg/dL (ref 70–99)
Glucose-Capillary: 89 mg/dL (ref 70–99)

## 2022-06-02 LAB — BASIC METABOLIC PANEL
Anion gap: 11 (ref 5–15)
BUN: 8 mg/dL (ref 6–20)
CO2: 25 mmol/L (ref 22–32)
Calcium: 9 mg/dL (ref 8.9–10.3)
Chloride: 108 mmol/L (ref 98–111)
Creatinine, Ser: 0.83 mg/dL (ref 0.44–1.00)
GFR, Estimated: >60 mL/min (ref >60)
Glucose, Bld: 92 mg/dL (ref 70–99)
Potassium: 3.9 mmol/L (ref 3.5–5.1)
Sodium: 144 mmol/L (ref 135–145)

## 2022-06-02 SURGERY — ESOPHAGOGASTRODUODENOSCOPY (EGD) WITH PROPOFOL
Anesthesia: General

## 2022-06-02 MED ORDER — PROPOFOL 10 MG/ML IV BOLUS
INTRAVENOUS | Status: DC | PRN
  Administered 2022-06-02: 50 mg via INTRAVENOUS

## 2022-06-02 MED ORDER — PROPOFOL 500 MG/50ML IV EMUL
INTRAVENOUS | Status: DC | PRN
  Administered 2022-06-02: 125 ug/kg/min via INTRAVENOUS

## 2022-06-02 MED ORDER — LIDOCAINE HCL (CARDIAC) PF 100 MG/5ML IV SOSY
PREFILLED_SYRINGE | INTRAVENOUS | Status: DC | PRN
  Administered 2022-06-02: 40 mg via INTRAVENOUS

## 2022-06-02 MED ORDER — SODIUM CHLORIDE 0.9 % IV SOLN: INTRAVENOUS | Status: DC

## 2022-06-02 NOTE — Assessment & Plan Note (Signed)
-   Every 4 hourly SSI while she is n.p.o.

## 2022-06-02 NOTE — Anesthesia Preprocedure Evaluation (Addendum)
Anesthesia Evaluation  Patient identified by MRN, date of birth, ID band Patient awake    Reviewed: Allergy & Precautions, NPO status , Patient's Chart, lab work & pertinent test results  Airway Mallampati: III  TM Distance: >3 FB Neck ROM: Full    Dental  (+) Teeth Intact, Dental Advisory Given   Pulmonary shortness of breath and with exertion, sleep apnea and Continuous Positive Airway Pressure Ventilation , COPD, Current Smoker and Patient abstained from smoking., former smoker,    breath sounds clear to auscultation       Cardiovascular Exercise Tolerance: Poor METS: < 3 Mets hypertension, + Peripheral Vascular Disease   Rhythm:Regular Rate:Normal + Systolic murmurs    Neuro/Psych  Headaches, PSYCHIATRIC DISORDERS Anxiety Depression Bipolar Disorder Schizophrenia  Neuromuscular disease (peripheral neuropathy) CVA (left sided weakness), Residual Symptoms    GI/Hepatic Type II achalasia intractable nausea vomiting  CT scan: IMPRESSION: There is no evidence of intestinal obstruction or pneumoperitoneum. There is no hydronephrosis. There is dilation of visualized lower thoracic esophagus with retained material in the lumen suggesting possible achalasia. Possible sludge in the lumen of gallbladder. Uterine fibroids. Lumbar spondylosis. Other findings as described in the body of the report.   Endo/Other  diabetes, Type 2, Insulin DependentMorbid obesity  Renal/GU CRFRenal disease     Musculoskeletal Chronic low back pain    Abdominal (+) + obese,   Peds  Hematology  (+) Blood dyscrasia, anemia ,   Anesthesia Other Findings   Reproductive/Obstetrics                            Anesthesia Physical  Anesthesia Plan  ASA: III  Anesthesia Plan: General   Post-op Pain Management:    Induction: Intravenous  PONV Risk Score and Plan: 2 and Propofol infusion, TIVA and Treatment may vary  due to age or medical condition  Airway Management Planned: Natural Airway and Mask  Additional Equipment:   Intra-op Plan:   Post-operative Plan:   Informed Consent: I have reviewed the patients History and Physical, chart, labs and discussed the procedure including the risks, benefits and alternatives for the proposed anesthesia with the patient or authorized representative who has indicated his/her understanding and acceptance.     Dental advisory given  Plan Discussed with: CRNA and Anesthesiologist  Anesthesia Plan Comments: (Recent CT scan with fluid in thoracic esophagus, but no retention in stomach noted. The aspiration risk of a small amount in esophagus is not greater than the risk of deep GA necessary for an ETT in this patient with history of strokes and COPD.)       Anesthesia Quick Evaluation

## 2022-06-02 NOTE — Consult Note (Signed)
Jonathon Bellows , MD [address] [place], [place], [place], [address] [id number], [place], [employer], [place], [address] Phone: [phone]  Fax: [phone]  Consultation  Referring Provider:   Dr Reesa Chew  Primary Care Physician:  System, Provider Not In Primary Gastroenterologist:  [facility]          Reason for Consultation:     achalasia   Date of Admission:  05/31/2022 Date of Consultation:  06/02/2022         HPI:   Diamond Rice is a 51 y.o. female who has previously seen Dr. Marius Ditch in May 2023 for dysphagia.  She has undergone upper endoscopy in May 2022 at [facility] by Dr. Deno Etienne for concerns of achalasia agree balloon dilator was used to dilate the GE junction to 20 mm after which 100 units of Botox was injected.  Repeat upper endoscopy in February 2023  By Dr. Marius Ditch showed no abnormalities.  The GE junction was wide open.  At her last visit with Dr. Marius Ditch she had no difficulties with swallowing.  Patient was seen on 03/28/2022 by [facility] clinic gastroenterology for the same reason was referred for esophageal manometry.  Patient has a diagnosis of type II achalasia.  She presents to the hospital with intractable nausea vomiting.  Plan was for the patient to go to [facility] once a bed opens up.  She was kept in [facility] for supportive care while awaiting a bed. On admission she was hypokalemic which has resolved nowUrine drug screen is positive for cannabinoids   She was very drowsy when I tried to speak to her , poor historian , says she has had long standing issues with swallowing, suffers from chronic nausea and smokes marijuana. Denies any complaints presentyly.  Past Medical History:  Diagnosis Date   Acute ischemic stroke ([place]) 12/27/2020   Acute renal failure (ARF) ([place]) 04/02/2018   Anxiety    Chronic lower back pain 04/17/2012   "just got over some; catched when I walked"   COPD (chronic obstructive pulmonary disease) ([place])    Critical lower limb ischemia ([place])  05/27/2018   Depression    Headache(784.0) 04/17/2012   "~ qod; lately waking up in am w/one"   High cholesterol    Hypertension    Migraines 04/17/2012   Obesity    Sleep apnea    CPAP   Stroke ([facility])    Type II diabetes mellitus ([place]) 08/28/2002    Past Surgical History:  Procedure Laterality Date   ABDOMINAL AORTOGRAM W/LOWER EXTREMITY Left 05/27/2018   Procedure: ABDOMINAL AORTOGRAM W/LOWER EXTREMITY [place] and Possible Intervention;  Surgeon: Waynetta Sandy, MD;  Location: [place] [facility];  Service: Cardiovascular;  Laterality: Left;   APPLICATION OF WOUND VAC Left 06/06/2018   Procedure: APPLICATION OF WOUND VAC;  Surgeon: Waynetta Sandy, MD;  Location: [place];  Service: Vascular;  Laterality: Left;   BALLOON DILATION N/A 12/31/2020   Procedure: [place];  Surgeon: Ronnette Juniper, MD;  Location: [place];  Service: Gastroenterology;  Laterality: N/A;   BIOPSY  04/04/2018   Procedure: BIOPSY;  Surgeon: [name], MD;  Location: [facility] ENDOSCOPY;  Service: Endoscopy;;   BIOPSY  10/27/2020   Procedure: BIOPSY;  Surgeon: Arta Silence, MD;  Location: [facility] ENDOSCOPY;  Service: Endoscopy;;   BOTOX INJECTION N/A 12/31/2020   Procedure: BOTOX INJECTION;  Surgeon: Ronnette Juniper, MD;  Location: [place];  Service: Gastroenterology;  Laterality: N/A;   CARDIAC CATHETERIZATION  ~ 2011  COLONOSCOPY N/A 04/04/2018   Procedure: COLONOSCOPY;  Surgeon: [name], MD;  Location: [facility] ENDOSCOPY;  Service: Endoscopy;  Laterality: N/A;   COLONOSCOPY WITH PROPOFOL N/A 10/27/2020   Procedure: COLONOSCOPY WITH PROPOFOL;  Surgeon: Arta Silence, MD;  Location: [facility] ENDOSCOPY;  Service: Endoscopy;  Laterality: N/A;   ESOPHAGOGASTRODUODENOSCOPY N/A 12/31/2020   Procedure: ESOPHAGOGASTRODUODENOSCOPY (EGD);  Surgeon: Ronnette Juniper, MD;  Location: [place];  Service: Gastroenterology;  Laterality: N/A;   ESOPHAGOGASTRODUODENOSCOPY (EGD) WITH PROPOFOL N/A 10/27/2020   Procedure:  ESOPHAGOGASTRODUODENOSCOPY (EGD) WITH PROPOFOL  WITH POSSIBLE DIL;  Surgeon: Arta Silence, MD;  Location: [facility] ENDOSCOPY;  Service: Endoscopy;  Laterality: N/A;   ESOPHAGOGASTRODUODENOSCOPY (EGD) WITH PROPOFOL N/A 10/19/2021   Procedure: ESOPHAGOGASTRODUODENOSCOPY (EGD) WITH PROPOFOL;  Surgeon: Lin Landsman, MD;  Location: [place];  Service: Gastroenterology;  Laterality: N/A;   FOREIGN BODY REMOVAL  12/31/2020   Procedure: FOREIGN BODY REMOVAL;  Surgeon: Ronnette Juniper, MD;  Location: [facility] ENDOSCOPY;  Service: Gastroenterology;;   LOWER EXTREMITY ANGIOGRAM Left 05/27/2018   PERIPHERAL VASCULAR INTERVENTION Left 05/27/2018   Procedure: PERIPHERAL VASCULAR INTERVENTION;  Surgeon: Waynetta Sandy, MD;  Location: [place] [facility];  Service: Cardiovascular;  Laterality: Left;   POLYPECTOMY  04/04/2018   Procedure: POLYPECTOMY;  Surgeon: [name], MD;  Location: [facility] ENDOSCOPY;  Service: Endoscopy;;   POLYPECTOMY  10/27/2020   Procedure: POLYPECTOMY;  Surgeon: Arta Silence, MD;  Location: [facility] ENDOSCOPY;  Service: Endoscopy;;   TRANSMETATARSAL AMPUTATION Left 05/28/2018   Procedure: AMPUTATION TOES THREE, FOUR AND FIVE on left foot;  Surgeon: Waynetta Sandy, MD;  Location: [place];  Service: Vascular;  Laterality: Left;   WOUND DEBRIDEMENT Left 06/06/2018   Procedure: DEBRIDEMENT WOUND LEFT FOOT;  Surgeon: Waynetta Sandy, MD;  Location: [place];  Service: Vascular;  Laterality: Left;    Prior to Admission medications   Medication Sig Start Date End Date Taking? Authorizing Provider  amLODipine (NORVASC) 10 MG tablet Take 1 tablet (10 mg total) by mouth daily. 01/22/20  Yes Royal Hawthorn, NP  aspirin EC 81 MG EC tablet Take 1 tablet (81 mg total) by mouth daily. 09/21/18  Yes Donzetta Starch, NP  atorvastatin (LIPITOR) 40 MG tablet Take 2 tablets (80 mg total) by mouth daily at 6 PM. 01/03/21  Yes Hongalgi, Lenis Dickinson, MD  baclofen (LIORESAL) 10 MG tablet Take 5 mg by  mouth 2 (two) times daily.   Yes [provider]  carboxymethylcellulose (REFRESH PLUS) 0.5 % SOLN 1 drop at bedtime.   Yes [provider]  carvedilol (COREG) 12.5 MG tablet Take 12.5 mg by mouth 2 (two) times daily with a meal.   Yes [provider]  cloNIDine (CATAPRES - DOSED IN MG/24 HR) 0.3 mg/24hr patch 0.3 mg once a week. 11/08/21  Yes [provider]  cloNIDine (CATAPRES) 0.2 MG tablet Take 0.2 mg by mouth daily.   Yes [provider]  famotidine (PEPCID) 40 MG tablet Take 40 mg by mouth daily. 12/15/21  Yes [provider]  FLUoxetine (PROZAC) 40 MG capsule Take 40 mg by mouth daily. 12/26/21  Yes [provider]  hydrALAZINE (APRESOLINE) 100 MG tablet Take 1 tablet (100 mg total) by mouth 3 (three) times daily. 01/22/20  Yes Wert, Margreta Journey, NP  insulin aspart (NOVOLOG) 100 UNIT/ML injection Inject 0-9 Units into the skin 3 (three) times daily with meals. Correction coverage: Sensitive (thin, NPO, renal) CBG < 70: Implement Hypoglycemia protocol. CBG 70 - 120: 0 units CBG 121 - 150:  1 unit CBG 151 - 200: 2 units CBG 201 - 250: 3 units CBG 251 - 300: 5 units CBG 301 - 350: 7 units CBG 351 - 400: 9 units CBG > 400: call MD. 01/03/21  Yes Hongalgi, Lenis Dickinson, MD  insulin glargine (LANTUS) 100 UNIT/ML injection Inject 0.1 mLs (10 Units total) into the skin daily. 01/21/21  Yes Nolberto Hanlon, MD  Insulin lispro (HUMALOG JUNIOR KWIKPEN) 100 UNIT/ML Inject into the skin. Sliding scale   Yes [provider]  lisinopril (ZESTRIL) 40 MG tablet Take 1 tablet (40 mg total) by mouth daily. 01/22/20  Yes Royal Hawthorn, NP  magnesium hydroxide (MILK OF MAGNESIA) 400 MG/5ML suspension Take by mouth daily as needed for mild constipation.   Yes [provider]  Melatonin 10 MG CAPS Take by mouth.   Yes [provider]  mirtazapine (REMERON) 30 MG tablet Take 30 mg by mouth at bedtime.   Yes [provider]   polyethylene glycol (MIRALAX / GLYCOLAX) 17 g packet Take 17 g by mouth daily.   Yes [provider]  senna (SENOKOT) 8.6 MG tablet Take 1 tablet by mouth daily.   Yes [provider]  TRULICITY 4.13 KG/4.0NU SOPN Inject into the skin. 08/10/21  Yes [provider]  zinc oxide 20 % ointment Apply 1 Application topically as needed for irritation.   Yes [provider]  albuterol (PROVENTIL) (2.5 MG/3ML) 0.083% nebulizer solution Take 3 mLs (2.5 mg total) by nebulization every 4 (four) hours as needed for wheezing or shortness of breath. 07/29/21 07/29/22  Duffy Bruce, MD  ondansetron (ZOFRAN-ODT) 4 MG disintegrating tablet Take 1 tablet (4 mg total) by mouth every 6 (six) hours as needed for nausea or vomiting. 02/23/22   Ward, Delice Bison, DO  pantoprazole (PROTONIX) 40 MG tablet Take 1 tablet (40 mg total) by mouth daily for 14 days. 02/18/22 03/04/22  Duffy Bruce, MD  promethazine (PHENERGAN) 25 MG/ML injection Inject 25 mg into the muscle daily. 05/25/22   [provider]  sodium chloride 0.45 % solution Inject into the vein. 05/28/22   [provider]  sodium chloride 0.9 % infusion Inject into the vein. 05/30/22   [provider]  sucralfate (CARAFATE) 1 g tablet Take 1 tablet (1 g total) by mouth 4 (four) times daily -  with meals and at bedtime for 7 days. 02/18/22 02/25/22  Duffy Bruce, MD  traMADol (ULTRAM) 50 MG tablet Take 50 mg by mouth every 6 (six) hours as needed.    [provider]    Family History  Problem Relation Age of Onset   Stroke Brother 27   Stroke Brother 87   Heart attack Mother 87   Stroke Father 48     Social History   Tobacco Use   Smoking status: Every Day    Packs/day: 0.25    Years: 28.00    Total pack years: 7.00    Types: Cigarettes   Smokeless tobacco: Never  Vaping Use   Vaping Use: Never used  Substance Use Topics   Alcohol use: No    Alcohol/week: 0.0 standard drinks of  alcohol    Comment: rare   Drug use: Yes    Types: Marijuana, "Crack" cocaine    Comment: no drugs for past year    Allergies as of 05/31/2022 - Review Complete 05/31/2022  Allergen Reaction Noted   Morphine and related Hives 10/23/2011   Metformin Diarrhea and Other (See Comments) 05/16/2018  Review of Systems:    All systems reviewed and negative except where noted in HPI.   Physical Exam:  Vital signs in last 24 hours: Temp:  [97.8 F (36.6 C)-98.7 F (37.1 C)] 98.2 F (36.8 C) (10/06 0843) Pulse Rate:  [76-90] 87 (10/06 0843) Resp:  [14-18] 17 (10/06 0843) BP: (148-169)/(92-112) 159/101 (10/06 0843) SpO2:  [98 %-99 %] 98 % (10/06 0843) Last BM Date : 05/31/22 General:   Pleasant, cooperative in NAD Head:  Normocephalic and atraumatic. Eyes:   No icterus.   Conjunctiva pink. PERRLA. Ears:  Normal auditory acuity. Neck:  Supple; no masses or thyroidomegaly Lungs: Respirations even and unlabored. Lungs clear to auscultation bilaterally.   No wheezes, crackles, or rhonchi.  Heart:  Regular rate and rhythm;  Without murmur, clicks, rubs or gallops Abdomen:  Soft, nondistended, nontender. Normal bowel sounds. No appreciable masses or hepatomegaly.  No rebound or guarding.  Neurologic:  Alert and oriented x3;   Psych:  Alert and cooperative. Normal affect.A bit drowsy   LAB RESULTS: Recent Labs    05/31/22 1534 06/01/22 0505  WBC 7.7 8.1  HGB 11.6* 10.3*  HCT 38.1 33.8*  PLT 274 253   BMET Recent Labs    05/31/22 1534 06/01/22 0505 06/02/22 0620  NA 143 143 144  K 3.1* 3.3* 3.9  CL 105 109 108  CO2 '24 25 25  '$ GLUCOSE 88 89 92  BUN '12 12 8  '$ CREATININE 0.82 0.83 0.83  CALCIUM 9.0 8.8* 9.0   LFT Recent Labs    06/01/22 0505  PROT 7.1  ALBUMIN 2.8*  AST 13*  ALT 8  ALKPHOS 63  BILITOT 1.3*   PT/INR No results for input(s): "LABPROT", "INR" in the last 72 hours.  STUDIES: DG Chest Portable 1 View  Result Date: 05/31/2022 CLINICAL DATA:  Nausea  and vomiting. EXAM: PORTABLE CHEST 1 VIEW COMPARISON:  Chest x-ray 02/18/2022 FINDINGS: There is some strandy opacities in the right lung base. There is a band of linear atelectasis or scarring in the left mid lung. Cardiomediastinal silhouette appears stable. The heart is enlarged, unchanged. No pleural effusion or pneumothorax. No acute fractures. IMPRESSION: 1. Strandy opacities in the right lung base, which could represent atelectasis or infection. 2. Stable cardiomegaly. Electronically Signed   By: Ronney Asters M.D.   On: 05/31/2022 19:16   CT ABDOMEN PELVIS W CONTRAST  Result Date: 05/31/2022 CLINICAL DATA:  Nausea, vomiting, abdominal pain EXAM: CT ABDOMEN AND PELVIS WITH CONTRAST TECHNIQUE: Multidetector CT imaging of the abdomen and pelvis was performed using the standard protocol following bolus administration of intravenous contrast. RADIATION DOSE REDUCTION: This exam was performed according to the departmental dose-optimization program which includes automated exposure control, adjustment of the mA and/or kV according to patient size and/or use of iterative reconstruction technique. CONTRAST:  163m OMNIPAQUE IOHEXOL 300 MG/ML  SOLN COMPARISON:  05/15/2022 FINDINGS: Lower chest: Heart is enlarged in size. There is dilation of visualized lower thoracic esophagus suggesting possible achalasia. The fluid and pockets of air in the lumen of lower thoracic esophagus. Hepatobiliary: No focal abnormalities are seen in liver. There is no dilation of bile ducts. There is subtle increased density in the lumen of gallbladder suggests possible sludge. There is no wall thickening. Pancreas: No focal abnormalities are seen. Spleen: Unremarkable. Adrenals/Urinary Tract: Adrenals are unremarkable. There is no hydronephrosis. There are no renal or ureteral stones. Urinary bladder is unremarkable. Stomach/Bowel: Stomach is not distended. Small bowel loops are not dilated. Appendix is  not dilated. There is no  significant wall thickening in colon. There is no pericolic stranding. Vascular/Lymphatic: Scattered arterial calcifications are seen. Reproductive: There is inhomogeneous attenuation in myometrium suggesting uterine fibroids. Other: There is no ascites or pneumoperitoneum. Musculoskeletal: There is marked encroachment of neural foramina at the L5-S1 level. IMPRESSION: There is no evidence of intestinal obstruction or pneumoperitoneum. There is no hydronephrosis. There is dilation of visualized lower thoracic esophagus with retained material in the lumen suggesting possible achalasia. Possible sludge in the lumen of gallbladder. Uterine fibroids. Lumbar spondylosis. Other findings as described in the body of the report. Electronically Signed   By: Elmer Picker M.D.   On: 05/31/2022 17:53      Impression / Plan:   Diamond Rice is a 51 y.o. y/o female with long standing history of dysphagia, Type II achalasia confirmed with esophageal manometry . Sees [facility] used to see Dr Marius Ditch. S/p Botox and CRE dilation at [facility]. Presently has issues with dysphagia likely secondary to achalasia and recurrent bouts of nausea/vomiting likely due to effects of marijuana.   Plan  EGD to rule food impaction OP Follow up with [facility] for dilation of achalasia  Stop marijuana use  PPI  I have discussed alternative options, risks & benefits,  which include, but are not limited to, bleeding, infection, perforation,respiratory complication & drug reaction.  The patient agrees with this plan & written consent will be obtained.     Thank you for involving me in the care of this patient.      LOS: 1 day   Jonathon Bellows, MD  06/02/2022, 9:48 AM

## 2022-06-02 NOTE — Progress Notes (Signed)
Progress Note   Patient: Diamond Rice WJX:[id number] DOB: 1971/04/01 DOA: 05/31/2022     1 DOS: the patient was seen and examined on 06/02/2022   Brief hospital course: Taken from H&P.  Diamond Rice is a 51 y.o. female with medical history significant of dysphagia, achalasia with plan for surgical intervention today at [facility], COPD, morbid obesity, migraine headaches, obstructive sleep apnea, type 2 diabetes, history of CVA with residual weakness who was brought to ER secondary to diversion from [facility].  Patient has been having intractable nausea with vomiting.  This been going on for a while which is why she was scheduled for intervention.  Her symptoms are severe that she is unable to keep anything down.  She has become dehydrated.  From the ER a call was placed by Dr. Quentin Cornwall to the physicians at [facility].  Due to lack of bed availability they have recommended that patient be admitted here mainly for supportive care including IV fluids and nausea control until a bed opens up at [facility].  They plan to get patient tomorrow morning possibly directly to the operating theater for intervention.  Patient unfortunately cannot go home with the symptoms at the moment.  Patient was found to be hypokalemic, potassium being repleted and she was started on IV fluid due to dehydration.  10/5: Potassium at 3.3, T. bili of 1.3.  CBC seems stable around his baseline.  Continue to have nausea and vomiting. Still no bed availability at [facility].  10/6: After making another phone call to [facility] today, only to find out that they will not accept her as imaging appears to have some food impaction.  [facility] wants to see her as an outpatient for pneumatic dilatation as they do not think there is an emergent need.  Talk with our GI and appreciate Dr. Vicente Males who proceed with EGD to help this lady who were unable to take any p.o. with resistant vomiting with all p.o. intake.  EGD shows thick material  in the lower esophagus which was removed, multiple clean-based duodenal ulcers which were biopsied and bilious fluid in stomach.  Patient tolerated the procedure well.  She will be started on clear liquid diet after the procedure.   Assessment and Plan: * Achalasia of esophagus Continue to have nausea and vomiting, unable to tolerate much p.o. unable to transfer to [facility] as they are advising outpatient GI follow-up. Upper GI did EGD to help her which shows thickened lower esophageal material which was removed, bilious secretions in stomach and multiple clean-based duodenal ulcers which were biopsied. Patient has an history of Botox injection in the past.  And most likely will need pneumatic dilatation which can be done at [facility]. Patient need to follow-up with [facility] as an outpatient. -We will start clear liquid diet after the procedure. -Continue to monitor  DM (diabetes mellitus) with peripheral vascular complication ([place]) - Every 4 hourly SSI while she is n.p.o.  Hypertension Blood pressure mildly elevated.  On multiple antihypertensives at home. Unable to take any p.o. due to persistent nausea and vomiting. -Switch clonidine with clonidine patch -As needed IV hydralazine -We will restart home medications once able to take p.o.  CKD (chronic kidney disease) stage 3, GFR 30-59 ml/min (HCC) History of CKD stage IIIa.  Creatinine appears to be at baseline. -Monitor renal function -Avoid nephrotoxins  Polysubstance abuse (HCC) - Check UDS  Chronic obstructive pulmonary disease (HCC) No concern of acute exacerbation. -  Continue to monitor -As needed bronchodilator  Class 2 severe obesity due to excess calories with serious comorbidity and body mass index (BMI) of 35.0 to 35.9 in adult [facility]) Estimated body mass index is 44.49 kg/m as calculated from the following:   Height as of this encounter: '5\' 1"'$  (1.549 m).   Weight as of this encounter: 106.8 kg.   -This will  complicate overall prognosis  History of CVA with residual deficit Holding home aspirin and statin due to persistent nausea and vomiting.  Hypokalemia Improved. -Continue to monitor and replete as needed.   Subjective: Patient still unable to tolerate any p.o.  We discussed about not using marijuana as it can be contributory.  Physical Exam: Vitals:   06/02/22 1446 06/02/22 1456 06/02/22 1523 06/02/22 1557  BP: (!) 161/105 (!) 150/104 (!) 180/114 (!) 162/108  Pulse:   90 93  Resp: 20   16  Temp:    98.8 F (37.1 C)  TempSrc:      SpO2:   98% 99%  Weight:      Height:       General.  Chronically ill-appearing obese lady, in no acute distress. Pulmonary.  Lungs clear bilaterally, normal respiratory effort. CV.  Regular rate and rhythm, no JVD, rub or murmur. Abdomen.  Soft, nontender, nondistended, BS positive. CNS.  Alert and oriented .  No focal neurologic deficit. Extremities.  No edema, no cyanosis, pulses intact and symmetrical. Psychiatry.  Judgment and insight appears normal.   Data Reviewed: Prior data reviewed  Family Communication: Discussed with patient,  Disposition: Status is: Inpatient Remains inpatient appropriate because: Severity of illness   Planned Discharge Destination: Home  Time spent: 50 minutes  This record has been created using Systems analyst. Errors have been sought and corrected,but may not always be located. Such creation errors do not reflect on the standard of care.  Author: Lorella Nimrod, MD 06/02/2022 4:28 PM  For on call review [email or web address].

## 2022-06-02 NOTE — Assessment & Plan Note (Signed)
Improved. -Continue to monitor and replete as needed.

## 2022-06-02 NOTE — Assessment & Plan Note (Signed)
Blood pressure mildly elevated.  On multiple antihypertensives at home. Unable to take any p.o. due to persistent nausea and vomiting. -Switch clonidine with clonidine patch -As needed IV hydralazine -We will restart home medications once able to take p.o.

## 2022-06-02 NOTE — Assessment & Plan Note (Addendum)
Continue to have nausea and vomiting, unable to tolerate much p.o. unable to transfer to [facility] as they are advising outpatient GI follow-up. Upper GI did EGD to help her which shows thickened lower esophageal material which was removed, bilious secretions in stomach and multiple clean-based duodenal ulcers which were biopsied. Patient has an history of Botox injection in the past.  And most likely will need pneumatic dilatation which can be done at [facility]. Patient need to follow-up with [facility] as an outpatient. -Encourage liquid diet which should include Ensure or boost to meet the nutritional requirement. -Continue to monitor

## 2022-06-02 NOTE — Transfer of Care (Signed)
Immediate Anesthesia Transfer of Care Note  Patient: Elanora Quin  Procedure(s) Performed: ESOPHAGOGASTRODUODENOSCOPY (EGD) WITH PROPOFOL  Patient Location: PACU  Anesthesia Type:General  Level of Consciousness: awake  Airway & Oxygen Therapy: Patient Spontanous Breathing  Post-op Assessment: Report given to RN and Post -op Vital signs reviewed and stable  Post vital signs: Reviewed and stable  Last Vitals:  Vitals Value Taken Time  BP 159/106 06/02/22 1436  Temp 36.8 C 06/02/22 1436  Pulse 98 06/02/22 1438  Resp 18 06/02/22 1438  SpO2 96 % 06/02/22 1438  Vitals shown include unvalidated device data.  Last Pain:  Vitals:   06/02/22 1436  TempSrc: Temporal  PainSc: Asleep      Patients Stated Pain Goal: 2 (67/01/10 0349)  Complications: No notable events documented.

## 2022-06-02 NOTE — Op Note (Signed)
[facility] Gastroenterology [name] Procedure Date: 06/02/2022 2:11 PM MRN: [id number] Account #: [id number] Date of Birth: [birth year] Admit Type: Inpatient Age: 51 Room: [facility] ENDO ROOM 4 Gender: Female Note Status: Finalized Instrument Name: Altamese Cabal Endoscope [id number] Procedure:             Upper GI endoscopy Indications:           Dysphagia Providers:             Jonathon Bellows MD, MD Referring MD:          no pcp Medicines:             Monitored Anesthesia Care Complications:         No immediate complications. Procedure:             Pre-Anesthesia Assessment:                        - Prior to the procedure, a History and Physical was                         performed, and patient medications, allergies and                         sensitivities were reviewed. The patient's tolerance                         of previous anesthesia was reviewed.                        - The risks and benefits of the procedure and the                         sedation options and risks were discussed with the                         patient. All questions were answered and informed                         consent was obtained.                        - ASA Grade Assessment: III - A patient with severe                         systemic disease.                        After obtaining informed consent, the endoscope was                         passed under direct vision. Throughout the procedure,                         the patient's blood pressure, pulse, and oxygen                         saturations were monitored continuously. The Endoscope                         was introduced through the mouth, and  advanced to the                         third part of duodenum. The upper GI endoscopy was                         technically difficult and complex due to the patient's                         oxygen desaturation. Successful completion of the                          procedure was aided by Anesthesia staff assisting with                         sedation. Findings:      Food was found in the lower third of the esophagus. Thick material none       solid      Bilious fluid was found on the greater curvature of the stomach.      Many non-bleeding superficial duodenal ulcers with a clean ulcer base       (Forrest Class III) were found in the duodenal bulb. The largest lesion       was 10 mm in largest dimension. Biopsies were taken with a cold forceps       for histology.      The cardia and gastric fundus were normal on retroflexion. Impression:            - Food in the lower third of the esophagus.                        - Bilious gastric fluid.                        - Non-bleeding duodenal ulcers with a clean ulcer base                         (Forrest Class III). Biopsied. Recommendation:        - Return patient to hospital ward for ongoing care.                        - Clear liquid diet today.                        - Continue present medications.                        - Return to GI clinic as previously scheduled. Procedure Code(s):     --- Professional ---                        [phone], Esophagogastroduodenoscopy, flexible,                         transoral; with biopsy, single or multiple Diagnosis Code(s):     --- Professional ---                        U04.540J, Food in esophagus causing other injury,  initial encounter                        K26.9, Duodenal ulcer, unspecified as acute or                         chronic, without hemorrhage or perforation                        R13.10, Dysphagia, unspecified CPT copyright 2019 American Medical Association. All rights reserved. The codes documented in this report are preliminary and upon coder review may  be revised to meet current compliance requirements. Jonathon Bellows, MD Jonathon Bellows MD, MD 06/02/2022 2:31:16 PM This report has been signed electronically. Number of  Addenda: 0 Note Initiated On: 06/02/2022 2:11 PM Estimated Blood Loss:  Estimated blood loss: none.      [facility]

## 2022-06-03 DIAGNOSIS — K220 Achalasia of cardia: Secondary | ICD-10-CM | POA: Diagnosis not present

## 2022-06-03 LAB — GLUCOSE, CAPILLARY
Glucose-Capillary: 100 mg/dL — ABNORMAL HIGH (ref 70–99)
Glucose-Capillary: 106 mg/dL — ABNORMAL HIGH (ref 70–99)
Glucose-Capillary: 107 mg/dL — ABNORMAL HIGH (ref 70–99)
Glucose-Capillary: 119 mg/dL — ABNORMAL HIGH (ref 70–99)
Glucose-Capillary: 123 mg/dL — ABNORMAL HIGH (ref 70–99)
Glucose-Capillary: 175 mg/dL — ABNORMAL HIGH (ref 70–99)
Glucose-Capillary: 91 mg/dL (ref 70–99)

## 2022-06-03 MED ORDER — ENSURE ENLIVE PO LIQD
237.0000 mL | Freq: Two times a day (BID) | ORAL | Status: DC
  Administered 2022-06-03 – 2022-06-05 (×5): 237 mL via ORAL

## 2022-06-03 NOTE — Assessment & Plan Note (Signed)
-   UDS was again positive for cannabinoid. -Patient was counseled as it can agree with her current problem.  History of cannabis induced nausea and vomiting.

## 2022-06-03 NOTE — Progress Notes (Signed)
Progress Note   Patient: Diamond Rice ZTI:[id number] DOB: [birth year] DOA: 05/31/2022     2 DOS: the patient was seen and examined on 06/03/2022   Brief hospital course: Taken from H&P.  Diamond Rice is a 51 y.o. female with medical history significant of dysphagia, achalasia with plan for surgical intervention today at [facility], COPD, morbid obesity, migraine headaches, obstructive sleep apnea, type 2 diabetes, history of CVA with residual weakness who was brought to ER secondary to diversion from [facility].  Patient has been having intractable nausea with vomiting.  This been going on for a while which is why she was scheduled for intervention.  Her symptoms are severe that she is unable to keep anything down.  She has become dehydrated.  From the ER a call was placed by Dr. Quentin Cornwall to the physicians at [facility].  Due to lack of bed availability they have recommended that patient be admitted here mainly for supportive care including IV fluids and nausea control until a bed opens up at [facility].  They plan to get patient tomorrow morning possibly directly to the operating theater for intervention.  Patient unfortunately cannot go home with the symptoms at the moment.  Patient was found to be hypokalemic, potassium being repleted and she was started on IV fluid due to dehydration.  10/5: Potassium at 3.3, T. bili of 1.3.  CBC seems stable around his baseline.  Continue to have nausea and vomiting. Still no bed availability at [facility].  10/6: After making another phone call to [facility] today, only to find out that they will not accept her as imaging appears to have some food impaction.  [facility] wants to see her as an outpatient for pneumatic dilatation as they do not think there is an emergent need.  Talk with our GI and appreciate Dr. Vicente Males who proceed with EGD to help this lady who were unable to take any p.o. with resistant vomiting with all p.o. intake.  EGD shows thick material  in the lower esophagus which was removed, multiple clean-based duodenal ulcers which were biopsied and bilious fluid in stomach.  Patient tolerated the procedure well.  She will be started on clear liquid diet after the procedure.  10/7: Continue to complain about nausea with any p.o. intake, no vomiting.  Need encouragement to drink some Ensure or boost so she can be discharged back to her facility.  She has to see [facility] as an outpatient for definitive treatment of her esophageal achalasia.   Assessment and Plan: * Achalasia of esophagus Continue to have nausea and vomiting, unable to tolerate much p.o. unable to transfer to [facility] as they are advising outpatient GI follow-up. Upper GI did EGD to help her which shows thickened lower esophageal material which was removed, bilious secretions in stomach and multiple clean-based duodenal ulcers which were biopsied. Patient has an history of Botox injection in the past.  And most likely will need pneumatic dilatation which can be done at [facility]. Patient need to follow-up with [facility] GI as an outpatient. -Encourage liquid diet which should include Ensure or boost to meet the nutritional requirement. -Continue to monitor  DM (diabetes mellitus) with peripheral vascular complication ([place]) - Every 4 hourly SSI while she is n.p.o.  Hypertension Blood pressure mildly elevated.  On multiple antihypertensives at home. Unable to take any p.o. due to persistent nausea and vomiting. -Switch clonidine with clonidine patch -As needed IV hydralazine -We will restart  home medications once able to take p.o.  CKD (chronic kidney disease) stage 3, GFR 30-59 ml/min (HCC) History of CKD stage IIIa.  Creatinine appears to be at baseline. -Monitor renal function -Avoid nephrotoxins  Polysubstance abuse ([place]) - UDS was again positive for cannabinoid. -Patient was counseled as it can agree with her current problem.  History of cannabis induced nausea and  vomiting.  Chronic obstructive pulmonary disease (HCC) No concern of acute exacerbation. -Continue to monitor -As needed bronchodilator  Class 2 severe obesity due to excess calories with serious comorbidity and body mass index (BMI) of 35.0 to 35.9 in adult [facility]) Estimated body mass index is 44.49 kg/m as calculated from the following:   Height as of this encounter: '5\' 1"'$  (1.549 m).   Weight as of this encounter: 106.8 kg.   -This will complicate overall prognosis  History of CVA with residual deficit Holding home aspirin and statin due to persistent nausea and vomiting.  Hypokalemia Improved. -Continue to monitor and replete as needed.   Subjective: Patient still not interested to try much food.  Stating that anything makes her nauseated, no vomiting since yesterday.  Encouraged to try Ensure and boost.  Physical Exam: Vitals:   06/02/22 2118 06/03/22 0537 06/03/22 0739 06/03/22 1601  BP: (!) 144/98 (!) 152/93 133/83 (!) 138/95  Pulse: 91 82 86 81  Resp: '16 16 15 16  '$ Temp: 98.1 F (36.7 C) 98 F (36.7 C) 98 F (36.7 C) 98.2 F (36.8 C)  TempSrc:      SpO2: 97% 97% 98% 96%  Weight:      Height:       General.  Chronically ill-appearing, obese lady, in no acute distress. Pulmonary.  Lungs clear bilaterally, normal respiratory effort. CV.  Regular rate and rhythm, no JVD, rub or murmur. Abdomen.  Soft, nontender, nondistended, BS positive. CNS.  Alert and oriented .  No focal neurologic deficit. Extremities.  No edema, no cyanosis, pulses intact and symmetrical. Psychiatry.  Judgment and insight appears normal.   Data Reviewed: Prior data reviewed  Family Communication: Discussed with patient,  Disposition: Status is: Inpatient Remains inpatient appropriate because: Severity of illness   Planned Discharge Destination: Home  Time spent: 43 minutes  This record has been created using Systems analyst. Errors have been sought and  corrected,but may not always be located. Such creation errors do not reflect on the standard of care.  Author: Lorella Nimrod, MD 06/03/2022 4:22 PM  For on call review [email or web address].

## 2022-06-03 NOTE — Progress Notes (Addendum)
PT Screening Note: 06/03/22  Diamond Rice is a 70yoF who comes to [facility] on 05/31/22 due to dysphagia c assocaited N/V. PMH includes CVA, DM2, HTN, HLD, tobacco, cocaine, PE, OSA, migraine HA. Pt inititally suppoed to transfer to [facility], but that plan changes, then she went to OR with Dr. Vicente Males for EGD.      06/03/22 1511  Diamond Rice expects to be discharged to: [facility])  Additional Comments Resides at [facility] resident x~1 year per pt.  Prior Function  Prior Level of Function  Needs assist  Mobility Comments self propels in WC, not with hemiparetic left leg; needs +1 for bed mobility typically can balance at EOD, performs stand pivot transfers at supervision level.  ADLs Comments needs 50% assist  or more for ADL at [facility]  Communication  Communication Expressive difficulties (post EGD soreness hoarseness)  Written Expression  Dominant Hand Right    Assessment:  PT called to evaluate. Pt asleep upon entry, but easily awakens. Not feeling well right not and declines mobility assessment. Pt denies any acute changes to her ADL mobility, leg strength independent in basic mobility that are associated with this current admitting diagnosis. She has not been mobilizing with NSG on unit today. Encouraged her to resume normal mobility here as she performs at facility and inform NSG if she feels to have any acute decline in her function or mobility. Will sign off at this time. Please consult [place] again if pt has a newly identified change in status.   3:38 PM, 06/03/22 Diamond Rice, PT, DPT Physical Therapist - [facility]  [phone] [facility])

## 2022-06-04 DIAGNOSIS — K220 Achalasia of cardia: Secondary | ICD-10-CM | POA: Diagnosis not present

## 2022-06-04 LAB — GLUCOSE, CAPILLARY
Glucose-Capillary: 100 mg/dL — ABNORMAL HIGH (ref 70–99)
Glucose-Capillary: 105 mg/dL — ABNORMAL HIGH (ref 70–99)
Glucose-Capillary: 107 mg/dL — ABNORMAL HIGH (ref 70–99)
Glucose-Capillary: 108 mg/dL — ABNORMAL HIGH (ref 70–99)
Glucose-Capillary: 129 mg/dL — ABNORMAL HIGH (ref 70–99)
Glucose-Capillary: 165 mg/dL — ABNORMAL HIGH (ref 70–99)

## 2022-06-04 NOTE — Progress Notes (Signed)
Progress Note   Patient: Diamond Rice [id number]:[id number] DOB: July 20, 1971 DOA: 05/31/2022     3 DOS: the patient was seen and examined on 06/04/2022   Brief hospital course: Taken from H&P.  Diamond Rice is a 51 y.o. female with medical history significant of dysphagia, achalasia with plan for surgical intervention today at [facility], COPD, morbid obesity, migraine headaches, obstructive sleep apnea, type 2 diabetes, history of CVA with residual weakness who was brought to ER secondary to diversion from [facility].  Patient has been having intractable nausea with vomiting.  This been going on for a while which is why she was scheduled for intervention.  Her symptoms are severe that she is unable to keep anything down.  She has become dehydrated.  From the ER a call was placed by Dr. Quentin Cornwall to the physicians at [facility].  Due to lack of bed availability they have recommended that patient be admitted here mainly for supportive care including IV fluids and nausea control until a bed opens up at [facility].  They plan to get patient tomorrow morning possibly directly to the operating theater for intervention.  Patient unfortunately cannot go home with the symptoms at the moment.  Patient was found to be hypokalemic, potassium being repleted and she was started on IV fluid due to dehydration.  10/5: Potassium at 3.3, T. bili of 1.3.  CBC seems stable around his baseline.  Continue to have nausea and vomiting. Still no bed availability at [facility].  10/6: After making another phone call to [facility] today, only to find out that they will not accept her as imaging appears to have some food impaction.  [facility] wants to see her as an outpatient for pneumatic dilatation as they do not think there is an emergent need.  Talk with our GI and appreciate Dr. Vicente Males who proceed with EGD to help this lady who were unable to take any p.o. with resistant vomiting with all p.o. intake.  EGD shows thick material  in the lower esophagus which was removed, multiple clean-based duodenal ulcers which were biopsied and bilious fluid in stomach.  Patient tolerated the procedure well.  She will be started on clear liquid diet after the procedure.  10/7: Continue to complain about nausea with any p.o. intake, no vomiting.  Need encouragement to drink some Ensure or boost so she can be discharged back to her facility.  She has to see [facility] as an outpatient for definitive treatment of her esophageal achalasia.  10/8: Improved nausea and vomiting today.  Still not very good appetite but able to drink some juices and Ensure. Should be able to go back to her facility so she can follow-up with her [facility] as an outpatient ASAP   Assessment and Plan: * Achalasia of esophagus Continue to have nausea and vomiting, unable to tolerate much p.o. unable to transfer to [facility] as they are advising outpatient GI follow-up. Upper GI did EGD to help her which shows thickened lower esophageal material which was removed, bilious secretions in stomach and multiple clean-based duodenal ulcers which were biopsied. Patient has an history of Botox injection in the past.  And most likely will need pneumatic dilatation which can be done at [facility]. Patient need to follow-up with [facility] as an outpatient. -Encourage liquid diet which should include Ensure or boost to meet the nutritional requirement. -Continue to monitor  DM (diabetes mellitus) with peripheral vascular complication ([place]) - Every  4 hourly SSI while she is n.p.o.  Hypertension Blood pressure mildly elevated.  On multiple antihypertensives at home. Unable to take any p.o. due to persistent nausea and vomiting. -Switch clonidine with clonidine patch -As needed IV hydralazine -We will restart home medications once able to take p.o.  CKD (chronic kidney disease) stage 3, GFR 30-59 ml/min (HCC) History of CKD stage IIIa.  Creatinine appears to be at baseline. -Monitor  renal function -Avoid nephrotoxins  Polysubstance abuse ([place]) - UDS was again positive for cannabinoid. -Patient was counseled as it can agree with her current problem.  History of cannabis induced nausea and vomiting.  Chronic obstructive pulmonary disease (HCC) No concern of acute exacerbation. -Continue to monitor -As needed bronchodilator  Class 2 severe obesity due to excess calories with serious comorbidity and body mass index (BMI) of 35.0 to 35.9 in adult [facility]) Estimated body mass index is 44.49 kg/m as calculated from the following:   Height as of this encounter: '5\' 1"'$  (1.549 m).   Weight as of this encounter: 106.8 kg.   -This will complicate overall prognosis  History of CVA with residual deficit Holding home aspirin and statin due to persistent nausea and vomiting.  Hypokalemia Improved. -Continue to monitor and replete as needed.   Subjective: Patient with improved nausea and vomiting.  Able to tolerate some liquid.Marland Kitchen  Physical Exam: Vitals:   06/03/22 1918 06/04/22 0431 06/04/22 0434 06/04/22 0752  BP: (!) 161/108 (!) 165/106 (!) 146/102 (!) 155/94  Pulse: 80 79  79  Resp: '18 18  17  '$ Temp: 98.9 F (37.2 C) 98.3 F (36.8 C)  97.9 F (36.6 C)  TempSrc: Oral     SpO2: 97% 97%  100%  Weight:      Height:       General.  Obese lady, in no acute distress. Pulmonary.  Lungs clear bilaterally, normal respiratory effort. CV.  Regular rate and rhythm, no JVD, rub or murmur. Abdomen.  Soft, nontender, nondistended, BS positive. CNS.  Alert and oriented .  No focal neurologic deficit. Extremities.  No edema, no cyanosis, pulses intact and symmetrical. Psychiatry.  Judgment and insight appears normal.   Data Reviewed: Prior data reviewed  Family Communication: Discussed with patient,  Disposition: Status is: Inpatient Remains inpatient appropriate because: Severity of illness   Planned Discharge Destination: Home  Time spent: 40 minutes  This record  has been created using Systems analyst. Errors have been sought and corrected,but may not always be located. Such creation errors do not reflect on the standard of care.  Author: Lorella Nimrod, MD 06/04/2022 3:50 PM  For on call review [email or web address].

## 2022-06-04 NOTE — NC FL2 (Signed)
[facility] LEVEL OF CARE SCREENING TOOL     IDENTIFICATION  [name] Birthdate: March 18, 1971 Sex: female Admission Date (Current Location): 05/31/2022  [facility] and [place] Number:  Engineering geologist and Address:  [facility], [address] [place], [place] [address]      Provider Number: [id number]  Attending Physician Name and Address:  Lorella Nimrod, MD  Relative Name and Phone Number:  Olivette, Beckmann (Brother)   [phone] (Mobile)    Current Level of Care: Hospital Recommended Level of Care: [place] Prior Approval Number:    Date Approved/Denied:   PASRR Number: [id number]  Discharge Plan:      Current Diagnoses: Patient Active Problem List   Diagnosis Date Noted   Achalasia of esophagus 05/31/2022   Pain of lower extremity 12/19/2021   Achalasia 08/17/2021   Dysphagia 08/17/2021   Acute CVA (cerebrovascular accident) ([place]) 01/18/2021   Dysarthria    Abnormal finding on lung imaging 12/27/2020   Severe episode of recurrent major depressive disorder, with psychotic features ([place])    Cerebrovascular accident (CVA) ([place]) 12/06/2019   AKI (acute kidney injury) ([place]) 12/06/2019   LVH (left ventricular hypertrophy) 08/10/2019   Edema of lower extremity 11/26/2018   Loss of appetite 11/26/2018   Urinary incontinence 11/26/2018   Allergic rhinitis 11/14/2018   Deep venous thrombosis ([place]) 11/14/2018   Nicotine dependence 11/14/2018   Hemorrhagic stroke ([place])    Diastolic dysfunction    Type 2 diabetes mellitus ([place])    Anemia of chronic disease    Chronic obstructive pulmonary disease ([place])    ICH (intracerebral hemorrhage) ([place]) 08/30/2018   Hypertensive heart disease without heart failure 07/04/2018   Nonrheumatic aortic valve insufficiency 07/04/2018   Esophageal abnormality    Class 2 severe obesity due to excess calories with serious comorbidity and body mass index (BMI) of 35.0  to 35.9 in adult [facility])    Peripheral vascular disease ([place])    History of osteomyelitis    History of amputation of lesser toe (HCC)    Pulmonary embolism ([place]) 06/22/2018   Microcytic anemia 06/17/2018   At risk for adverse drug reaction 06/11/2018   Critical lower limb ischemia ([place]) 05/27/2018   GI bleeding 04/02/2018   Hypokalemia 04/02/2018   Polysubstance abuse ([place]) 04/02/2018   Diabetic foot ulcer ([place]) 04/02/2018   Tinea pedis 03/07/2018   Diabetic peripheral neuropathy ([place]) 01/24/2018   Cannabis use disorder, moderate, dependence ([place]) 05/19/2017   Cocaine use disorder, moderate, dependence ([place]) 05/19/2017   CKD (chronic kidney disease) stage 3, GFR 30-59 ml/min ([place]) 02/17/2017   Noncompliance with medications 02/17/2017   Suicide attempt ([place]) 11/06/2016   Mild cognitive impairment 03/21/2016   Moderate episode of recurrent major depressive disorder ([place]) 03/21/2016   History of CVA with residual deficit 12/30/2015   Schizoaffective disorder, bipolar type ([place]) 10/07/2015   OSA (obstructive sleep apnea) 06/09/2015   Vitamin D deficiency 01/06/2015   Bilateral knee pain 01/05/2015   Numbness and tingling in right hand 01/05/2015   Insomnia 12/07/2014   Right-sided lacunar infarction ([place]) 09/15/2014   Left hemiparesis ([place]) 09/15/2014   Dyspnea    Back pain 09/11/2013   Psychoactive substance-induced organic mood disorder ([place]) 05/28/2013   Cocaine abuse with cocaine-induced mood disorder ([place]) 05/28/2013   Cannabis dependence with cannabis-induced anxiety disorder ([place]) 05/28/2013   Morbid obesity with BMI of 45.0-49.9, adult ([place]) 04/25/2012   Chest pain 04/17/2012   Hypertension 04/17/2012   DM (  diabetes mellitus) with peripheral vascular complication ([place]) 94/50/3888   Hyperlipidemia 04/17/2012   Tobacco use 04/17/2012    Orientation RESPIRATION BLADDER Height & Weight     Self, Time, Situation, Place  Normal Incontinent, External catheter Weight: 235 lb  7.2 oz (106.8 kg) Height:  '5\' 1"'$  (154.9 cm)  BEHAVIORAL SYMPTOMS/MOOD NEUROLOGICAL BOWEL NUTRITION STATUS        Diet (full liquid)  AMBULATORY STATUS COMMUNICATION OF NEEDS Skin   Limited Assist Verbally Normal                       Personal Care Assistance Level of Assistance  Bathing, Feeding, Dressing Bathing Assistance: Limited assistance Feeding assistance: Limited assistance Dressing Assistance: Limited assistance     Functional Limitations Info             SPECIAL CARE FACTORS FREQUENCY                       Contractures      Additional Factors Info  Code Status, Allergies Code Status Info: full Allergies Info: Morphine And Related, Metformin           Current Medications (06/04/2022):  This is the current hospital active medication list Current Facility-Administered Medications  Medication Dose Route Frequency Provider Last Rate Last Admin   alum & mag hydroxide-simeth (MAALOX/MYLANTA) 200-200-20 MG/5ML suspension 30 mL  30 mL Oral Once Merlyn Lot, MD       cloNIDine (CATAPRES - Dosed in mg/24 hr) patch 0.3 mg  0.3 mg Transdermal Weekly Lorella Nimrod, MD   0.3 mg at 06/01/22 1837   feeding supplement (ENSURE ENLIVE / ENSURE PLUS) liquid 237 mL  237 mL Oral BID BM Lorella Nimrod, MD   237 mL at 06/04/22 1341   hydrALAZINE (APRESOLINE) injection 10 mg  10 mg Intravenous Q6H PRN Lorella Nimrod, MD   10 mg at 06/02/22 1527   insulin aspart (novoLOG) injection 0-15 Units  0-15 Units Subcutaneous Q4H Lorella Nimrod, MD   3 Units at 06/04/22 1316   ketorolac (TORADOL) 30 MG/ML injection 30 mg  30 mg Intravenous Q6H PRN Gala Romney L, MD   30 mg at 06/03/22 2121   lactated ringers 1,000 mL with potassium chloride 40 mEq infusion   Intravenous Continuous Gala Romney L, MD 100 mL/hr at 06/04/22 1325 New Bag at 06/04/22 1325   ondansetron (ZOFRAN) tablet 4 mg  4 mg Oral Q6H PRN Elwyn Reach, MD       Or   ondansetron (ZOFRAN) injection 4 mg  4  mg Intravenous Q6H PRN Gala Romney L, MD   4 mg at 06/04/22 1316   promethazine (PHENERGAN) 25 mg in sodium chloride 0.9 % 50 mL IVPB  25 mg Intravenous Q6H PRN Lorella Nimrod, MD 200 mL/hr at 06/03/22 1004 25 mg at 06/03/22 1004     Discharge Medications: Please see discharge summary for a list of discharge medications.  Relevant Imaging Results:  Relevant Lab Results:   Additional Information SS #: [id number]  [place], LCSW

## 2022-06-04 NOTE — TOC Progression Note (Signed)
Transition of Care [facility]) - Progression Note    Patient Details  Name: Leroy Trim MRN: [id number] Date of Birth: May 05, 1971  Transition of Care [facility]) CM/SW Contact  Izola Price, RN Phone Number: 06/04/2022, 3:10 PM  Clinical Narrative:  10/8: [facility] can take patient back to facility Monday. AC notified and confirmed admission Monday. FL2 sent via inbox. Simmie Davies RN CM         Expected Discharge Plan and Services                                                 Social Determinants of Health (SDOH) Interventions    Readmission Risk Interventions    01/20/2021    8:22 AM  Readmission Risk Prevention Plan  Palliative Care Screening Not Applicable  Skilled Nursing Facility Complete

## 2022-06-04 NOTE — Plan of Care (Signed)

## 2022-06-05 ENCOUNTER — Encounter: Payer: Self-pay | Admitting: Gastroenterology

## 2022-06-05 DIAGNOSIS — K220 Achalasia of cardia: Secondary | ICD-10-CM | POA: Diagnosis not present

## 2022-06-05 LAB — GLUCOSE, CAPILLARY
Glucose-Capillary: 101 mg/dL — ABNORMAL HIGH (ref 70–99)
Glucose-Capillary: 100 mg/dL — ABNORMAL HIGH (ref 70–99)
Glucose-Capillary: 125 mg/dL — ABNORMAL HIGH (ref 70–99)

## 2022-06-05 MED ORDER — ENSURE ENLIVE PO LIQD: 237.0000 mL | Freq: Two times a day (BID) | ORAL | 12 refills | Status: DC

## 2022-06-05 MED ORDER — METOCLOPRAMIDE HCL 10 MG/10ML PO SOLN: 10.0000 mg | Freq: Three times a day (TID) | ORAL | 0 refills | Status: DC

## 2022-06-05 MED ORDER — LISINOPRIL 20 MG PO TABS: 20.0000 mg | ORAL_TABLET | Freq: Every day | ORAL | Status: DC

## 2022-06-05 MED ORDER — METOCLOPRAMIDE HCL 10 MG/10ML PO SOLN
10.0000 mg | Freq: Three times a day (TID) | ORAL | Status: DC
  Administered 2022-06-05: 10 mg via ORAL
  Filled 2022-06-05 (×3): qty 10

## 2022-06-05 MED ORDER — BACLOFEN 10 MG PO TABS: 5.0000 mg | ORAL_TABLET | Freq: Two times a day (BID) | ORAL | 0 refills | Status: DC | PRN

## 2022-06-05 NOTE — Plan of Care (Signed)
Problem: Education: Goal: Knowledge of General Education information will improve Description: Including pain rating scale, medication(s)/side effects and non-pharmacologic comfort measures 06/05/2022 1311 by Mancel Bale, RN Outcome: Adequate for Discharge 06/05/2022 0819 by Mancel Bale, RN Outcome: Progressing   Problem: Health Behavior/Discharge Planning: Goal: Ability to manage health-related needs will improve 06/05/2022 1311 by Mancel Bale, RN Outcome: Adequate for Discharge 06/05/2022 0819 by Mancel Bale, RN Outcome: Progressing   Problem: Clinical Measurements: Goal: Ability to maintain clinical measurements within normal limits will improve 06/05/2022 1311 by Mancel Bale, RN Outcome: Adequate for Discharge 06/05/2022 0819 by Mancel Bale, RN Outcome: Progressing Goal: Will remain free from infection 06/05/2022 1311 by Mancel Bale, RN Outcome: Adequate for Discharge 06/05/2022 0819 by Mancel Bale, RN Outcome: Progressing Goal: Diagnostic test results will improve 06/05/2022 1311 by Mancel Bale, RN Outcome: Adequate for Discharge 06/05/2022 0819 by Mancel Bale, RN Outcome: Progressing Goal: Respiratory complications will improve 06/05/2022 1311 by Mancel Bale, RN Outcome: Adequate for Discharge 06/05/2022 0819 by Mancel Bale, RN Outcome: Progressing Goal: Cardiovascular complication will be avoided 06/05/2022 1311 by Mancel Bale, RN Outcome: Adequate for Discharge 06/05/2022 0819 by Mancel Bale, RN Outcome: Progressing   Problem: Activity: Goal: Risk for activity intolerance will decrease 06/05/2022 1311 by Mancel Bale, RN Outcome: Adequate for Discharge 06/05/2022 0819 by Mancel Bale, RN Outcome: Progressing   Problem: Nutrition: Goal: Adequate nutrition will be maintained 06/05/2022 1311 by Mancel Bale, RN Outcome: Adequate for Discharge 06/05/2022 0819 by Mancel Bale, RN Outcome:  Progressing   Problem: Coping: Goal: Level of anxiety will decrease 06/05/2022 1311 by Mancel Bale, RN Outcome: Adequate for Discharge 06/05/2022 0819 by Mancel Bale, RN Outcome: Progressing   Problem: Elimination: Goal: Will not experience complications related to bowel motility 06/05/2022 1311 by Mancel Bale, RN Outcome: Adequate for Discharge 06/05/2022 0819 by Mancel Bale, RN Outcome: Progressing Goal: Will not experience complications related to urinary retention 06/05/2022 1311 by Mancel Bale, RN Outcome: Adequate for Discharge 06/05/2022 0819 by Mancel Bale, RN Outcome: Progressing   Problem: Pain Managment: Goal: General experience of comfort will improve 06/05/2022 1311 by Mancel Bale, RN Outcome: Adequate for Discharge 06/05/2022 0819 by Mancel Bale, RN Outcome: Progressing   Problem: Safety: Goal: Ability to remain free from injury will improve 06/05/2022 1311 by Mancel Bale, RN Outcome: Adequate for Discharge 06/05/2022 0819 by Mancel Bale, RN Outcome: Progressing   Problem: Skin Integrity: Goal: Risk for impaired skin integrity will decrease 06/05/2022 1311 by Mancel Bale, RN Outcome: Adequate for Discharge 06/05/2022 0819 by Mancel Bale, RN Outcome: Progressing   Problem: Education: Goal: Ability to describe self-care measures that may prevent or decrease complications (Diabetes Survival Skills Education) will improve 06/05/2022 1311 by Mancel Bale, RN Outcome: Adequate for Discharge 06/05/2022 0819 by Mancel Bale, RN Outcome: Progressing Goal: Individualized Educational Video(s) 06/05/2022 1311 by Mancel Bale, RN Outcome: Adequate for Discharge 06/05/2022 0819 by Mancel Bale, RN Outcome: Progressing   Problem: Coping: Goal: Ability to adjust to condition or change in health will improve 06/05/2022 1311 by Mancel Bale, RN Outcome: Adequate for Discharge 06/05/2022 0819 by Mancel Bale, RN Outcome: Progressing   Problem: Fluid Volume: Goal: Ability to maintain a balanced intake and output will improve 06/05/2022 1311 by Mancel Bale, RN Outcome: Adequate for Discharge 06/05/2022 0819 by Isa Rankin,  Rebecka Apley, RN Outcome: Progressing   Problem: Health Behavior/Discharge Planning: Goal: Ability to identify and utilize available resources and services will improve 06/05/2022 1311 by Mancel Bale, RN Outcome: Adequate for Discharge 06/05/2022 0819 by Mancel Bale, RN Outcome: Progressing Goal: Ability to manage health-related needs will improve 06/05/2022 1311 by Mancel Bale, RN Outcome: Adequate for Discharge 06/05/2022 0819 by Mancel Bale, RN Outcome: Progressing   Problem: Metabolic: Goal: Ability to maintain appropriate glucose levels will improve 06/05/2022 1311 by Mancel Bale, RN Outcome: Adequate for Discharge 06/05/2022 0819 by Mancel Bale, RN Outcome: Progressing   Problem: Nutritional: Goal: Maintenance of adequate nutrition will improve 06/05/2022 1311 by Mancel Bale, RN Outcome: Adequate for Discharge 06/05/2022 0819 by Mancel Bale, RN Outcome: Progressing Goal: Progress toward achieving an optimal weight will improve 06/05/2022 1311 by Mancel Bale, RN Outcome: Adequate for Discharge 06/05/2022 0819 by Mancel Bale, RN Outcome: Progressing   Problem: Skin Integrity: Goal: Risk for impaired skin integrity will decrease 06/05/2022 1311 by Mancel Bale, RN Outcome: Adequate for Discharge 06/05/2022 0819 by Mancel Bale, RN Outcome: Progressing   Problem: Tissue Perfusion: Goal: Adequacy of tissue perfusion will improve 06/05/2022 1311 by Mancel Bale, RN Outcome: Adequate for Discharge 06/05/2022 0819 by Mancel Bale, RN Outcome: Progressing

## 2022-06-05 NOTE — Care Management Important Message (Signed)
Important Message  Patient Details  Name: Diamond Rice MRN: [id number] Date of Birth: August 10, 1971   Medicare Important Message Given:  N/A - LOS <3 / Initial given by admissions     Juliann Pulse A Lysle Yero 06/05/2022, 11:24 AM

## 2022-06-05 NOTE — Progress Notes (Signed)
Pt discharging back to [facility] via EMS. Report called to [facility] at [facility]. PIV removed. Discharge packet given to EMS transport.

## 2022-06-05 NOTE — Plan of Care (Signed)

## 2022-06-05 NOTE — Progress Notes (Addendum)
Spoke with Diamond Rice in admissions at [name]. Patient can return to facility. Advised per MD patient will need an appointment scheduled with [facility] outpatient GI.   Spoke with patient to advise her of discharge back to facility. Attempt to contact patient's brother. No answer. Unable to leave a message. Patient brother stated he was probably at dialysis.   EMS arranged. EMS packet arranged. Nurse notified of room assignment 14A , and to call report to [phone].   TOC signing off.

## 2022-06-05 NOTE — TOC Transition Note (Signed)
Transition of Care [facility]) - CM/SW Discharge Note   Patient Details  Name: Stormie Ventola MRN: [id number] Date of Birth: 27-Aug-1971  [facility]) CM/SW Contact:  Laurena Slimmer, RN Phone Number: 06/05/2022, 1:02 PM   Clinical Narrative:    Attempt to contact patient's  brother three times to advised of  discharge. No answer. Unable to leave a message.    Final next level of care: Skilled Nursing Facility Barriers to Discharge: Barriers Resolved   Patient Goals and CMS Choice Patient states their goals for this hospitalization and ongoing recovery are:: To return to facility      Discharge Placement                Patient to be transferred to facility by: [facility]   Patient and family notified of of transfer: 06/05/22  Discharge Plan and Services                                     Social Determinants of Health (SDOH) Interventions     Readmission Risk Interventions    01/20/2021    8:22 AM  Readmission Risk Prevention Plan  Palliative Care Screening Not Applicable  Skilled Nursing Facility Complete

## 2022-06-05 NOTE — Discharge Summary (Signed)
Physician Discharge Summary   Patient: Diamond Rice MRN: [id number] DOB: Apr 09, 1971  Admit date:     05/31/2022  Discharge date: 06/05/22  Discharge Physician: Lorella Nimrod   PCP: System, Provider Not In   Recommendations at discharge:  Please arrange follow-up appointment with her [facility] gastroenterologist ASAP for definitive treatment of her achalasia, patient needs pneumatic dilatation which is beyond the scope of [place] [facility]. We added Reglan to improve her motility. Patient had EGD during this stay with removal of material from the lower esophagus and also found to have duodenal ulcers-biopsies were taken-pending final results. Encourage Ensure or boost, all quantity at a time to meet the nutritional requirement.  She is unable to take regular food.  Only able to tolerate liquid diet. If developed worsening nausea or vomiting, please send her to [facility] ED so they can help her there.  [place] [facility] will not be able to provide more assistance.  Her [facility] wants her to see as outpatient before proceeding for any procedure.  They declined transfer during this hospitalization.   Discharge Diagnoses: Principal Problem:   Achalasia of esophagus Active Problems:   DM (diabetes mellitus) with peripheral vascular complication (HCC)   Hypertension   CKD (chronic kidney disease) stage 3, GFR 30-59 ml/min (HCC)   Polysubstance abuse (HCC)   Chronic obstructive pulmonary disease (HCC)   Class 2 severe obesity due to excess calories with serious comorbidity and body mass index (BMI) of 35.0 to 35.9 in adult [facility])   History of CVA with residual deficit   Hypokalemia   Hospital Course: Taken from H&P.  Diamond Rice is a 51 y.o. female with medical history significant of dysphagia, achalasia with plan for surgical intervention today at [facility], COPD, morbid obesity, migraine headaches, obstructive sleep apnea, type 2 diabetes, history of CVA with residual weakness who was  brought to ER secondary to diversion from [facility].  Patient has been having intractable nausea with vomiting.  This been going on for a while which is why she was scheduled for intervention.  Her symptoms are severe that she is unable to keep anything down.  She has become dehydrated.  From the ER a call was placed by Dr. Quentin Cornwall to the physicians at [facility].  Due to lack of bed availability they have recommended that patient be admitted here mainly for supportive care including IV fluids and nausea control until a bed opens up at [facility].  They plan to get patient tomorrow morning possibly directly to the operating theater for intervention.  Patient unfortunately cannot go home with the symptoms at the moment.  Patient was found to be hypokalemic, potassium being repleted and she was started on IV fluid due to dehydration.  10/5: Potassium at 3.3, T. bili of 1.3.  CBC seems stable around his baseline.  Continue to have nausea and vomiting. Still no bed availability at [facility].  10/6: After making another phone call to [facility] today, only to find out that they will not accept her as imaging appears to have some food impaction.  [facility] wants to see her as an outpatient for pneumatic dilatation as they do not think there is an emergent need.  Talk with our GI and appreciate Dr. Vicente Males who proceed with EGD to help this lady who were unable to take any p.o. with resistant vomiting with all p.o. intake.  EGD shows thick material in the lower esophagus which was removed, multiple clean-based duodenal ulcers  which were biopsied and bilious fluid in stomach.  Patient tolerated the procedure well.  She will be started on clear liquid diet after the procedure.  10/7: Continue to complain about nausea with any p.o. intake, no vomiting.  Need encouragement to drink some Ensure or boost so she can be discharged back to her facility.  She has to see [facility] GI as an outpatient for definitive treatment of her  esophageal achalasia.  10/8: Improved nausea and vomiting today.  Still not very good appetite but able to drink some juices and Ensure. Should be able to go back to her facility so she can follow-up with her [facility] as an outpatient ASAP.  10/9: Patient remained stable.  Able to tolerate liquid, drinking Ensure or boost.  She should encourage to continue drinking Ensure or boost in small quantities at a time to meet the nutritional requirement and prevent symptoms.  No marijuana as it causes worsening of her symptoms. We also added Reglan 3 times daily to help with motility. Patient need to see her [facility] GI ASAP for definitive treatment of her achalasia.  Patient was on multiple medications belong to the same class, we cleaned some of her list.  She should be taking medications according to this new list and need to have a close follow-up with PCP for further management and med rec.  Assessment and Plan: * Achalasia of esophagus Continue to have nausea and vomiting, unable to tolerate much p.o. unable to transfer to [facility] as they are advising outpatient GI follow-up. [facility] did EGD to help her which shows thickened lower esophageal material which was removed, bilious secretions in stomach and multiple clean-based duodenal ulcers which were biopsied. Patient has an history of Botox injection in the past.  And most likely will need pneumatic dilatation which can be done at [facility]. Patient need to follow-up with [facility] as an outpatient. -Encourage liquid diet which should include Ensure or boost to meet the nutritional requirement. -Continue to monitor  DM (diabetes mellitus) with peripheral vascular complication ([place]) - Every 4 hourly SSI while she is n.p.o.  Hypertension Blood pressure mildly elevated.  On multiple antihypertensives at home. Unable to take any p.o. due to persistent nausea and vomiting. -Switch clonidine with clonidine patch -As needed IV hydralazine -We will  restart home medications once able to take p.o.  CKD (chronic kidney disease) stage 3, GFR 30-59 ml/min (HCC) History of CKD stage IIIa.  Creatinine appears to be at baseline. -Monitor renal function -Avoid nephrotoxins  Polysubstance abuse ([place]) - UDS was again positive for cannabinoid. -Patient was counseled as it can agree with her current problem.  History of cannabis induced nausea and vomiting.  Chronic obstructive pulmonary disease (HCC) No concern of acute exacerbation. -Continue to monitor -As needed bronchodilator  Class 2 severe obesity due to excess calories with serious comorbidity and body mass index (BMI) of 35.0 to 35.9 in adult [facility]) Estimated body mass index is 44.49 kg/m as calculated from the following:   Height as of this encounter: '5\' 1"'$  (1.549 m).   Weight as of this encounter: 106.8 kg.   -This will complicate overall prognosis  History of CVA with residual deficit Holding home aspirin and statin due to persistent nausea and vomiting.  Hypokalemia Improved. -Continue to monitor and replete as needed.   Consultants: GI Procedures performed: EGD Disposition: Skilled nursing facility Diet recommendation:  Discharge Diet Orders (From admission, onward)     Start  Ordered   06/05/22 0000  Diet - low sodium heart healthy        06/05/22 1049           Full liquid diet DISCHARGE MEDICATION: Allergies as of 06/05/2022       Reactions   Morphine And Related Hives   Metformin Diarrhea, Other (See Comments)   "Allergic," per [facility]        Medication List     STOP taking these medications    amLODipine 10 MG tablet Commonly known as: NORVASC   cloNIDine 0.2 MG tablet Commonly known as: CATAPRES   HumaLOG Junior KwikPen 100 UNIT/ML Generic drug: Insulin lispro   hydrALAZINE 100 MG tablet Commonly known as: APRESOLINE   insulin glargine 100 UNIT/ML injection Commonly known as: LANTUS   sodium chloride 0.45 % solution   sodium  chloride 0.9 % infusion   sucralfate 1 g tablet Commonly known as: Carafate       TAKE these medications    albuterol (2.5 MG/3ML) 0.083% nebulizer solution Commonly known as: PROVENTIL Take 3 mLs (2.5 mg total) by nebulization every 4 (four) hours as needed for wheezing or shortness of breath.   aspirin EC 81 MG tablet Take 1 tablet (81 mg total) by mouth daily.   atorvastatin 40 MG tablet Commonly known as: LIPITOR Take 2 tablets (80 mg total) by mouth daily at 6 PM.   baclofen 10 MG tablet Commonly known as: LIORESAL Take 0.5 tablets (5 mg total) by mouth 2 (two) times daily as needed for muscle spasms. What changed:  when to take this reasons to take this   carboxymethylcellulose 0.5 % Soln Commonly known as: REFRESH PLUS 1 drop at bedtime.   carvedilol 12.5 MG tablet Commonly known as: COREG Take 12.5 mg by mouth 2 (two) times daily with a meal.   cloNIDine 0.3 mg/24hr patch Commonly known as: CATAPRES - Dosed in mg/24 hr 0.3 mg once a week.   famotidine 40 MG tablet Commonly known as: PEPCID Take 40 mg by mouth daily.   feeding supplement Liqd Take 237 mLs by mouth 2 (two) times daily between meals.   FLUoxetine 40 MG capsule Commonly known as: PROZAC Take 40 mg by mouth daily.   insulin aspart 100 UNIT/ML injection Commonly known as: novoLOG Inject 0-9 Units into the skin 3 (three) times daily with meals. Correction coverage: Sensitive (thin, NPO, renal) CBG < 70: Implement Hypoglycemia protocol. CBG 70 - 120: 0 units CBG 121 - 150: 1 unit CBG 151 - 200: 2 units CBG 201 - 250: 3 units CBG 251 - 300: 5 units CBG 301 - 350: 7 units CBG 351 - 400: 9 units CBG > 400: call MD.   lisinopril 20 MG tablet Commonly known as: ZESTRIL Take 1 tablet (20 mg total) by mouth daily. What changed:  medication strength how much to take   magnesium hydroxide 400 MG/5ML suspension Commonly known as: MILK OF MAGNESIA Take by mouth daily as needed for mild  constipation.   Melatonin 10 MG Caps Take by mouth.   metoCLOPramide 10 MG/10ML Soln Commonly known as: REGLAN Take 10 mLs (10 mg total) by mouth 3 (three) times daily before meals.   mirtazapine 30 MG tablet Commonly known as: REMERON Take 30 mg by mouth at bedtime.   ondansetron 4 MG disintegrating tablet Commonly known as: ZOFRAN-ODT Take 1 tablet (4 mg total) by mouth every 6 (six) hours as needed for nausea or vomiting.   pantoprazole 40 MG  tablet Commonly known as: Protonix Take 1 tablet (40 mg total) by mouth daily for 14 days.   polyethylene glycol 17 g packet Commonly known as: MIRALAX / GLYCOLAX Take 17 g by mouth daily.   promethazine 25 MG/ML injection Commonly known as: PHENERGAN Inject 25 mg into the muscle daily.   senna 8.6 MG tablet Commonly known as: SENOKOT Take 1 tablet by mouth daily.   traMADol 50 MG tablet Commonly known as: ULTRAM Take 50 mg by mouth every 6 (six) hours as needed.   Trulicity 8.34 HD/6.2IW Sopn Generic drug: Dulaglutide Inject into the skin.   zinc oxide 20 % ointment Apply 1 Application topically as needed for irritation.        Follow-up Information     Minus Breeding, MD. Schedule an appointment as soon as possible for a visit in 1 week(s).   Specialty: Cardiology Contact information: [address] [place] [address] [phone]                Discharge Exam: Danley Danker Weights   05/31/22 1455 05/31/22 2132  Weight: 105 kg 106.8 kg   General.  Obese lady, in no acute distress. Pulmonary.  Lungs clear bilaterally, normal respiratory effort. CV.  Regular rate and rhythm, no JVD, rub or murmur. Abdomen.  Soft, nontender, nondistended, BS positive. CNS.  Alert and oriented .  No focal neurologic deficit. Extremities.  No edema, no cyanosis, pulses intact and symmetrical. Psychiatry.  Judgment and insight appears normal.   Condition at discharge: stable  The results of significant  diagnostics from this hospitalization (including imaging, microbiology, ancillary and laboratory) are listed below for reference.   Imaging Studies: DG Chest Portable 1 View  Result Date: 05/31/2022 CLINICAL DATA:  Nausea and vomiting. EXAM: PORTABLE CHEST 1 VIEW COMPARISON:  Chest x-ray 02/18/2022 FINDINGS: There is some strandy opacities in the right lung base. There is a band of linear atelectasis or scarring in the left mid lung. Cardiomediastinal silhouette appears stable. The heart is enlarged, unchanged. No pleural effusion or pneumothorax. No acute fractures. IMPRESSION: 1. Strandy opacities in the right lung base, which could represent atelectasis or infection. 2. Stable cardiomegaly. Electronically Signed   By: Ronney Asters M.D.   On: 05/31/2022 19:16   CT ABDOMEN PELVIS W CONTRAST  Result Date: 05/31/2022 CLINICAL DATA:  Nausea, vomiting, abdominal pain EXAM: CT ABDOMEN AND PELVIS WITH CONTRAST TECHNIQUE: Multidetector CT imaging of the abdomen and pelvis was performed using the standard protocol following bolus administration of intravenous contrast. RADIATION DOSE REDUCTION: This exam was performed according to the departmental dose-optimization program which includes automated exposure control, adjustment of the mA and/or kV according to patient size and/or use of iterative reconstruction technique. CONTRAST:  115m OMNIPAQUE IOHEXOL 300 MG/ML  SOLN COMPARISON:  05/15/2022 FINDINGS: Lower chest: Heart is enlarged in size. There is dilation of visualized lower thoracic esophagus suggesting possible achalasia. The fluid and pockets of air in the lumen of lower thoracic esophagus. Hepatobiliary: No focal abnormalities are seen in liver. There is no dilation of bile ducts. There is subtle increased density in the lumen of gallbladder suggests possible sludge. There is no wall thickening. Pancreas: No focal abnormalities are seen. Spleen: Unremarkable. Adrenals/Urinary Tract: Adrenals are  unremarkable. There is no hydronephrosis. There are no renal or ureteral stones. Urinary bladder is unremarkable. Stomach/Bowel: Stomach is not distended. Small bowel loops are not dilated. Appendix is not dilated. There is no significant wall thickening in colon. There is no pericolic  stranding. Vascular/Lymphatic: Scattered arterial calcifications are seen. Reproductive: There is inhomogeneous attenuation in myometrium suggesting uterine fibroids. Other: There is no ascites or pneumoperitoneum. Musculoskeletal: There is marked encroachment of neural foramina at the L5-S1 level. IMPRESSION: There is no evidence of intestinal obstruction or pneumoperitoneum. There is no hydronephrosis. There is dilation of visualized lower thoracic esophagus with retained material in the lumen suggesting possible achalasia. Possible sludge in the lumen of gallbladder. Uterine fibroids. Lumbar spondylosis. Other findings as described in the body of the report. Electronically Signed   By: Elmer Picker M.D.   On: 05/31/2022 17:53   VAS [place] ABI WITH/WO TBI  Result Date: 05/19/2022  LOWER EXTREMITY DOPPLER STUDY Patient Name:  Janila Arrazola  Date of Exam:   05/17/2022 Medical Rec #: [id number]          Accession #:    [id number] Date of Birth: 1971/06/22           Patient Gender: F Patient Age:   50 years Exam Location:  [facility] Procedure:      VAS [place] ABI WITH/WO TBI Referring Phys: --------------------------------------------------------------------------------  Indications: Peripheral artery disease. High Risk Factors: Prior CVA.  Performing Technologist: Concha Norway RVT  Examination Guidelines: A complete evaluation includes at minimum, Doppler waveform signals and systolic blood pressure reading at the level of bilateral brachial, anterior tibial, and posterior tibial arteries, when vessel segments are accessible. Bilateral testing is considered an integral part of a complete examination. Photoelectric  Plethysmograph (PPG) waveforms and toe systolic pressure readings are included as required and additional duplex testing as needed. Limited examinations for reoccurring indications may be performed as noted.  ABI Findings: +---------+------------------+-----+--------+--------+ Right    Rt Pressure (mmHg)IndexWaveformComment  +---------+------------------+-----+--------+--------+ Brachial 124                                     +---------+------------------+-----+--------+--------+ ATA      158               1.27 biphasic         +---------+------------------+-----+--------+--------+ PTA      140               1.13 biphasic         +---------+------------------+-----+--------+--------+ Great Toe128               1.03 Normal           +---------+------------------+-----+--------+--------+ +---------+------------------+-----+--------+-------+ Left     Lt Pressure (mmHg)IndexWaveformComment +---------+------------------+-----+--------+-------+ ATA      134               1.08 biphasic        +---------+------------------+-----+--------+-------+ PTA      134               1.08 biphasic        +---------+------------------+-----+--------+-------+ Great Toe110               0.89 Abnormal        +---------+------------------+-----+--------+-------+ Bilateral ABIs appear essentially unchanged compared to prior study on 10/2021.  Summary: Right: Resting right ankle-brachial index is within normal range. The right toe-brachial index is normal. Left: Resting left ankle-brachial index is within normal range. The left toe-brachial index is normal. *See table(s) above for measurements and observations.  Electronically signed by Leotis Pain MD on 05/19/2022 at 10:54:26 AM.    Final    CT ABDOMEN PELVIS  W CONTRAST  Result Date: 05/15/2022 CLINICAL DATA:  Left-sided flank pain, nausea and vomiting since Friday. EXAM: CT ABDOMEN AND PELVIS WITH CONTRAST TECHNIQUE: Multidetector CT  imaging of the abdomen and pelvis was performed using the standard protocol following bolus administration of intravenous contrast. RADIATION DOSE REDUCTION: This exam was performed according to the departmental dose-optimization program which includes automated exposure control, adjustment of the mA and/or kV according to patient size and/or use of iterative reconstruction technique. CONTRAST:  14m OMNIPAQUE IOHEXOL 300 MG/ML  SOLN COMPARISON:  03/21/2022 FINDINGS: Lower chest: The lung bases are grossly clear. Limited examination due to breathing motion artifact. Heart is borderline enlarged but stable. Stable markedly distended distal esophagus with possible food impaction. I do not see any evidence of achalasia on a prior esophagram. This is likely refluxed material in a hiatal hernia. Hepatobiliary: No hepatic lesions or intrahepatic biliary dilatation. The gallbladder is unremarkable. No common bile duct dilatation. Pancreas: No mass, inflammation or ductal dilatation. Spleen: Normal size.  No focal lesions. Adrenals/Urinary Tract: The adrenal glands and kidneys are unremarkable. No renal lesions, renal calculi or hydronephrosis. The bladder is unremarkable. Stomach/Bowel: The stomach, duodenum, small bowel and colon are unremarkable. Vascular/Lymphatic: The aorta is normal in caliber. No dissection. The branch vessels are patent. The major venous structures are patent. No mesenteric or retroperitoneal mass or adenopathy. Small scattered lymph nodes are noted. Reproductive: Stable uterine fibroids.  The ovaries are normal. Other: No pelvic mass or adenopathy. No free pelvic fluid collections. No inguinal mass or adenopathy. No abdominal wall hernia or subcutaneous lesions. Musculoskeletal: No acute bony findings. Stable degenerative changes involving the thoracic and lower lumbar spine. Significant degenerative disc disease and facet disease at L5-S1. IMPRESSION: 1. No acute abdominal/pelvic findings, mass  lesions or adenopathy. 2. Stable markedly distended distal esophagus with possible food impaction. 3. Stable uterine fibroids. Electronically Signed   By: PMarijo SanesM.D.   On: 05/15/2022 14:36    Microbiology: Results for orders placed or performed during the hospital encounter of 02/23/22  Group A Strep by PCR ([place])     Status: None   Collection Time: 02/23/22  4:26 AM   Specimen: Throat; Sterile Swab  Result Value Ref Range Status   Group A Strep by PCR NOT DETECTED NOT DETECTED Final    Comment: [facility] [address], [place] [address]   Labs: CBC: Recent Labs  Lab 05/31/22 1534 06/01/22 0505  WBC 7.7 8.1  HGB 11.6* 10.3*  HCT 38.1 33.8*  MCV 70.3* 70.6*  PLT 274 2638  Basic Metabolic Panel: Recent Labs  Lab 05/31/22 1534 06/01/22 0505 06/02/22 0620  NA 143 143 144  K 3.1* 3.3* 3.9  CL 105 109 108  CO2 '24 25 25  '$ GLUCOSE 88 89 92  BUN '12 12 8  '$ CREATININE 0.82 0.83 0.83  CALCIUM 9.0 8.8* 9.0  MG  --  1.9  --    Liver Function Tests: Recent Labs  Lab 05/31/22 1534 06/01/22 0505  AST 16 13*  ALT 9 8  ALKPHOS 70 63  BILITOT 1.2 1.3*  PROT 7.8 7.1  ALBUMIN 3.1* 2.8*   CBG: Recent Labs  Lab 06/04/22 1613 06/04/22 2022 06/04/22 2341 06/05/22 0459 06/05/22 0737  GLUCAP 105* 129* 100* 101* 100*    Discharge time spent: greater than 30 minutes.  This record has been created using [employer] analyst Errors have been sought and corrected,but may not always be  located. Such creation errors do not reflect on the standard of care.   Signed: Lorella Nimrod, MD [facility] 06/05/2022

## 2022-06-07 ENCOUNTER — Encounter: Payer: Self-pay | Admitting: Gastroenterology

## 2022-06-07 LAB — SURGICAL PATHOLOGY

## 2022-06-26 ENCOUNTER — Encounter (INDEPENDENT_AMBULATORY_CARE_PROVIDER_SITE_OTHER): Payer: Self-pay

## 2022-07-26 ENCOUNTER — Ambulatory Visit: Payer: Medicare Other | Admitting: Gastroenterology

## 2022-10-29 ENCOUNTER — Emergency Department
Admission: EM | Admit: 2022-10-29 | Discharge: 2022-10-29 | Disposition: A | Discharge status: Skilled Nursing Facility | Payer: 59 | Attending: Student in an Organized Health Care Education/Training Program | Admitting: Student in an Organized Health Care Education/Training Program

## 2022-10-29 ENCOUNTER — Other Ambulatory Visit: Payer: Self-pay

## 2022-10-29 ENCOUNTER — Emergency Department: Payer: 59

## 2022-10-29 DIAGNOSIS — R109 Unspecified abdominal pain: Secondary | ICD-10-CM | POA: Diagnosis not present

## 2022-10-29 DIAGNOSIS — R300 Dysuria: Secondary | ICD-10-CM | POA: Diagnosis not present

## 2022-10-29 DIAGNOSIS — E119 Type 2 diabetes mellitus without complications: Secondary | ICD-10-CM | POA: Diagnosis not present

## 2022-10-29 LAB — COMPREHENSIVE METABOLIC PANEL
ALT: 17 U/L (ref 0–44)
AST: 21 U/L (ref 15–41)
Albumin: 3.6 g/dL (ref 3.5–5.0)
Alkaline Phosphatase: 111 U/L (ref 38–126)
Anion gap: 10 (ref 5–15)
BUN: 15 mg/dL (ref 6–20)
CO2: 28 mmol/L (ref 22–32)
Calcium: 9.2 mg/dL (ref 8.9–10.3)
Chloride: 102 mmol/L (ref 98–111)
Creatinine, Ser: 1.22 mg/dL — ABNORMAL HIGH (ref 0.44–1.00)
GFR, Estimated: 54 mL/min — ABNORMAL LOW (ref >60)
Glucose, Bld: 158 mg/dL — ABNORMAL HIGH (ref 70–99)
Potassium: 3.5 mmol/L (ref 3.5–5.1)
Sodium: 140 mmol/L (ref 135–145)
Total Bilirubin: 0.5 mg/dL (ref 0.3–1.2)
Total Protein: 9 g/dL — ABNORMAL HIGH (ref 6.5–8.1)

## 2022-10-29 LAB — URINALYSIS, ROUTINE W REFLEX MICROSCOPIC
Bilirubin Urine: NEGATIVE
Glucose, UA: NEGATIVE mg/dL
Ketones, ur: NEGATIVE mg/dL
Nitrite: NEGATIVE
Protein, ur: 100 mg/dL — AB
RBC / HPF: >50 RBC/hpf (ref 0–5)
Specific Gravity, Urine: 1.01 (ref 1.005–1.030)
WBC, UA: >50 WBC/hpf (ref 0–5)
pH: 8 (ref 5.0–8.0)

## 2022-10-29 LAB — CBC
HCT: 39 % (ref 36.0–46.0)
Hemoglobin: 11.7 g/dL — ABNORMAL LOW (ref 12.0–15.0)
MCH: 22 pg — ABNORMAL LOW (ref 26.0–34.0)
MCHC: 30 g/dL (ref 30.0–36.0)
MCV: 73.3 fL — ABNORMAL LOW (ref 80.0–100.0)
Platelets: 281 10*3/uL (ref 150–400)
RBC: 5.32 MIL/uL — ABNORMAL HIGH (ref 3.87–5.11)
RDW: 17.7 % — ABNORMAL HIGH (ref 11.5–15.5)
WBC: 8.6 10*3/uL (ref 4.0–10.5)
nRBC: 0 % (ref 0.0–0.2)

## 2022-10-29 MED ORDER — IOHEXOL 350 MG/ML SOLN
100.0000 mL | Freq: Once | INTRAVENOUS | Status: AC | PRN
  Administered 2022-10-29: 100 mL via INTRAVENOUS

## 2022-10-29 MED ORDER — NITROFURANTOIN MONOHYD MACRO 100 MG PO CAPS: 100.0000 mg | ORAL_CAPSULE | Freq: Two times a day (BID) | ORAL | 0 refills | Status: AC

## 2022-10-29 MED ORDER — TRAMADOL HCL 50 MG PO TABS
50.0000 mg | ORAL_TABLET | Freq: Once | ORAL | Status: AC
  Administered 2022-10-29: 50 mg via ORAL
  Filled 2022-10-29: qty 1

## 2022-10-29 MED ORDER — SODIUM CHLORIDE 0.9 % IV SOLN
1.0000 g | Freq: Once | INTRAVENOUS | Status: AC
  Administered 2022-10-29: 1 g via INTRAVENOUS
  Filled 2022-10-29: qty 10

## 2022-10-29 MED ORDER — ACETAMINOPHEN 500 MG PO TABS
1000.0000 mg | ORAL_TABLET | Freq: Once | ORAL | Status: AC
  Administered 2022-10-29: 1000 mg via ORAL
  Filled 2022-10-29: qty 2

## 2022-10-29 NOTE — ED Provider Notes (Signed)
[facility] Provider Note    Event Date/Time   First MD Initiated Contact with Patient 10/29/22 [phone]     (approximate)   History   Flank Pain   HPI  Tenaya Kraynik is a 52 y.o. female states past medical history including diabetes status of use status post stroke with history of UTIs presents to the ER for evaluation of dysuria and flank pain.  Has been recently treated for UTI.  On review of her past medical record has E. coli UTI.  No history of ESBL infection.  No reported fevers.  No vomiting.     Physical Exam   Triage Vital Signs: ED Triage Vitals  Enc Vitals Group     BP --      Pulse Rate 10/29/22 0237 88     Resp 10/29/22 0237 (!) 21     Temp 10/29/22 0234 98.9 F (37.2 C)     Temp Source 10/29/22 0234 Oral     SpO2 10/29/22 0237 100 %     Weight 10/29/22 0234 267 lb 1.6 oz (121.2 kg)     Height 10/29/22 0234 '5\' 1"'$  (1.549 m)     Head Circumference --      Peak Flow --      Pain Score 10/29/22 0234 10     Pain Loc --      Pain Edu? --      Excl. in [place]? --     Most recent vital signs: Vitals:   10/29/22 0300 10/29/22 0357  BP: (!) 167/110 (!) 161/96  Pulse: 87 85  Resp: (!) 22 17  Temp:    SpO2: 100% 100%     Constitutional: Alert  Eyes: Conjunctivae are normal.  Head: Atraumatic. Nose: No congestion/rhinnorhea. Mouth/Throat: Mucous membranes are moist.   Neck: Painless ROM.  Cardiovascular:   Good peripheral circulation. Respiratory: Normal respiratory effort.  No retractions.  Gastrointestinal: Soft mild right lower quadrant tenderness palpation no guarding or rebound. Musculoskeletal:  no deformity Neurologic:  MAE spontaneously. No gross focal neurologic deficits are appreciated.  Skin:  Skin is warm, dry and intact. No rash noted. Psychiatric: Mood and affect are normal. Speech and behavior are normal.    ED Results / Procedures / Treatments   Labs (all labs ordered are listed, but only abnormal results  are displayed) Labs Reviewed  URINALYSIS, ROUTINE W REFLEX MICROSCOPIC - Abnormal; Notable for the following components:      Result Value   Color, Urine STRAW (*)    APPearance HAZY (*)    Hgb urine dipstick SMALL (*)    Protein, ur 100 (*)    Leukocytes,Ua MODERATE (*)    Bacteria, UA RARE (*)    All other components within normal limits  COMPREHENSIVE METABOLIC PANEL - Abnormal; Notable for the following components:   Glucose, Bld 158 (*)    Creatinine, Ser 1.22 (*)    Total Protein 9.0 (*)    GFR, Estimated 54 (*)    All other components within normal limits  CBC - Abnormal; Notable for the following components:   RBC 5.32 (*)    Hemoglobin 11.7 (*)    MCV 73.3 (*)    MCH 22.0 (*)    RDW 17.7 (*)    All other components within normal limits     EKG     RADIOLOGY Please see ED Course for my review and interpretation.  I personally reviewed all radiographic images ordered to evaluate for the  above acute complaints and reviewed radiology reports and findings.  These findings were personally discussed with the patient.  Please see medical record for radiology report.    PROCEDURES:  Critical Care performed:   Procedures   MEDICATIONS ORDERED IN ED: Medications  acetaminophen (TYLENOL) tablet 1,000 mg (has no administration in time range)  cefTRIAXone (ROCEPHIN) 1 g in sodium chloride 0.9 % 100 mL IVPB (has no administration in time range)  traMADol (ULTRAM) tablet 50 mg (has no administration in time range)  iohexol (OMNIPAQUE) 350 MG/ML injection 100 mL (100 mLs Intravenous Contrast Given 10/29/22 0342)     IMPRESSION / MDM / ASSESSMENT AND PLAN / ED COURSE  I reviewed the triage vital signs and the nursing notes.                              Differential diagnosis includes, but is not limited to, UTI, pyelonephritis, stone, appendicitis, colitis, diverticulitis  Patient presenting to the ER for evaluation of symptoms as described above.  Based on  symptoms, risk factors and considered above differential, this presenting complaint could reflect a potentially life-threatening illness therefore the patient will be placed on continuous pulse oximetry and telemetry for monitoring.  Laboratory evaluation will be sent to evaluate for the above complaints.  CT imaging will be ordered as she does have right lower quadrant tenderness palpation on exam to rule out and evaluate for the above differential.  Urinalysis based on her history does appear consistent with UTI.   Clinical Course as of 10/29/22 0414  Nancy Fetter Oct 29, 2022  0401 CT on my review and interpretation without evidence of stone or hydro.  Per radiology no acute findings.  She is not septic.  Will treat for UTI.  Does appear stable and appropriate for outpatient management. [PR]    Clinical Course User Index [PR] Merlyn Lot, MD     FINAL CLINICAL IMPRESSION(S) / ED DIAGNOSES   Final diagnoses:  Right flank pain  Dysuria     Rx / DC Orders   ED Discharge Orders          Ordered    nitrofurantoin, macrocrystal-monohydrate, (MACROBID) 100 MG capsule  2 times daily        10/29/22 0410             Note:  This document was prepared using Dragon voice recognition software and may include unintentional dictation errors.    Merlyn Lot, MD 10/29/22 [phone]

## 2022-10-29 NOTE — ED Notes (Signed)
[facility] called for transport back to [facility]

## 2022-10-29 NOTE — ED Triage Notes (Addendum)
Pt arrives via [facility] from [facility] with CC of L flank pain x3 hours. Pt reports decreased urinary frequency. Denies burning with urination, nausea, vomiting, and diarrhea. Pt alert and oriented at this time.

## 2022-12-03 ENCOUNTER — Emergency Department
Admission: EM | Admit: 2022-12-03 | Discharge: 2022-12-03 | Disposition: A | Discharge status: Home / Self Care | Payer: 59 | Attending: Emergency Medicine | Admitting: Emergency Medicine

## 2022-12-03 ENCOUNTER — Emergency Department: Payer: 59

## 2022-12-03 ENCOUNTER — Encounter: Payer: Self-pay | Admitting: Emergency Medicine

## 2022-12-03 DIAGNOSIS — S42215A Unspecified nondisplaced fracture of surgical neck of left humerus, initial encounter for closed fracture: Secondary | ICD-10-CM

## 2022-12-03 DIAGNOSIS — W19XXXA Unspecified fall, initial encounter: Secondary | ICD-10-CM

## 2022-12-03 DIAGNOSIS — W050XXA Fall from non-moving wheelchair, initial encounter: Secondary | ICD-10-CM | POA: Diagnosis not present

## 2022-12-03 DIAGNOSIS — M25512 Pain in left shoulder: Secondary | ICD-10-CM | POA: Diagnosis present

## 2022-12-03 MED ORDER — ONDANSETRON 4 MG PO TBDP: 4.0000 mg | ORAL_TABLET | Freq: Three times a day (TID) | ORAL | 0 refills | Status: AC | PRN

## 2022-12-03 MED ORDER — OXYCODONE-ACETAMINOPHEN 5-325 MG PO TABS: 1.0000 | ORAL_TABLET | Freq: Four times a day (QID) | ORAL | 0 refills | Status: AC | PRN

## 2022-12-03 MED ORDER — ONDANSETRON 4 MG PO TBDP
4.0000 mg | ORAL_TABLET | Freq: Once | ORAL | Status: AC
  Administered 2022-12-03: 4 mg via ORAL
  Filled 2022-12-03: qty 1

## 2022-12-03 MED ORDER — OXYCODONE-ACETAMINOPHEN 5-325 MG PO TABS
1.0000 | ORAL_TABLET | Freq: Once | ORAL | Status: AC
  Administered 2022-12-03: 1 via ORAL
  Filled 2022-12-03: qty 1

## 2022-12-03 NOTE — ED Provider Notes (Signed)
[facility] Provider Note  Patient Contact: 8:48 PM (approximate)   History   Fall   HPI  Diamond Rice is a 52 y.o. female presents to the emergency department with acute left shoulder pain after patient fell out of wheelchair and fell onto her left shoulder.  She states that she has baseline weakness in this extremity due to a prior CVA.  She is not currently on any blood thinners.  She denies hitting her head or neck.  No abrasions or lacerations.      Physical Exam   Triage Vital Signs: ED Triage Vitals  Enc Vitals Group     BP 12/03/22 1913 (!) 122/52     Pulse Rate 12/03/22 1913 91     Resp 12/03/22 1913 20     Temp 12/03/22 1913 98.6 F (37 C)     Temp Source 12/03/22 1913 Oral     SpO2 12/03/22 1913 95 %     Weight 12/03/22 1914 267 lb (121.1 kg)     Height 12/03/22 1914 5\' 1"  (1.549 m)     Head Circumference --      Peak Flow --      Pain Score 12/03/22 1919 9     Pain Loc --      Pain Edu? --      Excl. in [place]? --     Most recent vital signs: Vitals:   12/03/22 1913  BP: (!) 122/52  Pulse: 91  Resp: 20  Temp: 98.6 F (37 C)  SpO2: 95%     General: Alert and in no acute distress. Eyes:  PERRL. EOMI. Head: No acute traumatic findings ENT:      Nose: No congestion/rhinnorhea.      Mouth/Throat: Mucous membranes are moist. Neck: No stridor. No cervical spine tenderness to palpation. Cardiovascular:  Good peripheral perfusion Respiratory: Normal respiratory effort without tachypnea or retractions. Lungs CTAB. Good air entry to the bases with no decreased or absent breath sounds. Gastrointestinal: Bowel sounds 4 quadrants. Soft and nontender to palpation. No guarding or rigidity. No palpable masses. No distention. No CVA tenderness. Musculoskeletal: Full range of motion to all extremities.  Neurologic:  No gross focal neurologic deficits are appreciated.  Skin:   No rash noted    ED Results / Procedures / Treatments    Labs (all labs ordered are listed, but only abnormal results are displayed) Labs Reviewed - No data to display     RADIOLOGY  I personally viewed and evaluated these images as part of my medical decision making, as well as reviewing the written report by the radiologist.  ED Provider Interpretation: Patient has comminuted head and neck fractures of the left humerus.   PROCEDURES:  Critical Care performed: No  Procedures   MEDICATIONS ORDERED IN ED: Medications  oxyCODONE-acetaminophen (PERCOCET/ROXICET) 5-325 MG per tablet 1 tablet (has no administration in time range)  ondansetron (ZOFRAN-ODT) disintegrating tablet 4 mg (has no administration in time range)     IMPRESSION / MDM / ASSESSMENT AND PLAN / ED COURSE  I reviewed the triage vital signs and the nursing notes.                              Assessment and plan Fall Humeral fracture 52 year old female presents to the emergency department with acute left shoulder pain.  X-ray indicates comminuted nondisplaced femoral head and neck fractures.  Patient was placed in a  sling for comfort and was prescribed Percocet for pain.  She was given a follow-up referral to orthopedics.  Return precautions were given to return with new or worsening symptoms.   FINAL CLINICAL IMPRESSION(S) / ED DIAGNOSES   Final diagnoses:  Fall, initial encounter  Closed nondisplaced fracture of surgical neck of left humerus, unspecified fracture morphology, initial encounter     Rx / DC Orders   ED Discharge Orders          Ordered    oxyCODONE-acetaminophen (PERCOCET/ROXICET) 5-325 MG tablet  Every 6 hours PRN        12/03/22 2046    ondansetron (ZOFRAN-ODT) 4 MG disintegrating tablet  Every 8 hours PRN        12/03/22 2046             Note:  This document was prepared using Dragon voice recognition software and may include unintentional dictation errors.   Pia Mau Cataract, Cordelia Poche 12/03/22 2051    Pilar Jarvis,  MD 12/03/22 [phone]

## 2022-12-03 NOTE — ED Triage Notes (Signed)
Pt arrived via [facility] from [facility] where pts wheelchair went forward off curb and pt landed forward onto pavement. Pt denies hitting head and denies LOC. Pt c/o left shoulder pain without the ability to move it.

## 2022-12-03 NOTE — ED Triage Notes (Signed)
First Nurse Note;  Pt via [facility] from  [facility]. Pt fell out of her wheelchair today around 1:00pm. XR was done at facility and were negative but pt continues to have pt. Pt has a hx of stroke. Pt is A&Ox4 and NAD, per EMS VSS

## 2022-12-03 NOTE — ED Notes (Signed)
Called [facility] for transport back to [facility]

## 2022-12-03 NOTE — Discharge Instructions (Addendum)
You can take Percocet for pain. 

## 2022-12-25 ENCOUNTER — Ambulatory Visit (HOSPITAL_COMMUNITY)
Admission: RE | Admit: 2022-12-25 | Discharge: 2022-12-25 | Disposition: A | Discharge status: Home / Self Care | Payer: 59 | Source: Ambulatory Visit | Attending: Cardiology | Admitting: Cardiology

## 2022-12-25 DIAGNOSIS — E119 Type 2 diabetes mellitus without complications: Secondary | ICD-10-CM | POA: Insufficient documentation

## 2022-12-25 DIAGNOSIS — I119 Hypertensive heart disease without heart failure: Secondary | ICD-10-CM | POA: Insufficient documentation

## 2022-12-25 DIAGNOSIS — I082 Rheumatic disorders of both aortic and tricuspid valves: Secondary | ICD-10-CM | POA: Diagnosis not present

## 2022-12-25 DIAGNOSIS — I359 Nonrheumatic aortic valve disorder, unspecified: Secondary | ICD-10-CM | POA: Diagnosis present

## 2022-12-25 DIAGNOSIS — J449 Chronic obstructive pulmonary disease, unspecified: Secondary | ICD-10-CM | POA: Diagnosis not present

## 2022-12-25 DIAGNOSIS — I351 Nonrheumatic aortic (valve) insufficiency: Secondary | ICD-10-CM | POA: Diagnosis not present

## 2022-12-25 DIAGNOSIS — G4730 Sleep apnea, unspecified: Secondary | ICD-10-CM | POA: Insufficient documentation

## 2022-12-25 LAB — ECHOCARDIOGRAM COMPLETE
AV Vena cont: 0.3 cm
Area-P 1/2: 3.03 cm2
P 1/2 time: 330 msec
S' Lateral: 2.7 cm

## 2022-12-25 NOTE — Progress Notes (Signed)
  Echocardiogram 2D Echocardiogram has been performed.  Diamond Rice 12/25/2022, 4:23 PM

## 2023-01-01 ENCOUNTER — Ambulatory Visit (HOSPITAL_COMMUNITY): Payer: 59

## 2023-01-11 ENCOUNTER — Encounter: Payer: Self-pay | Admitting: *Deleted

## 2023-01-28 ENCOUNTER — Emergency Department: Payer: 59

## 2023-01-28 ENCOUNTER — Other Ambulatory Visit: Payer: Self-pay

## 2023-01-28 ENCOUNTER — Emergency Department
Admission: EM | Admit: 2023-01-28 | Discharge: 2023-01-28 | Disposition: A | Discharge status: Home / Self Care | Payer: 59 | Attending: Emergency Medicine | Admitting: Emergency Medicine

## 2023-01-28 DIAGNOSIS — K220 Achalasia of cardia: Secondary | ICD-10-CM | POA: Insufficient documentation

## 2023-01-28 DIAGNOSIS — R1310 Dysphagia, unspecified: Secondary | ICD-10-CM | POA: Diagnosis present

## 2023-01-28 LAB — CBC
HCT: 36.8 % (ref 36.0–46.0)
Hemoglobin: 10.9 g/dL — ABNORMAL LOW (ref 12.0–15.0)
MCH: 22.5 pg — ABNORMAL LOW (ref 26.0–34.0)
MCHC: 29.6 g/dL — ABNORMAL LOW (ref 30.0–36.0)
MCV: 76 fL — ABNORMAL LOW (ref 80.0–100.0)
Platelets: 365 10*3/uL (ref 150–400)
RBC: 4.84 MIL/uL (ref 3.87–5.11)
RDW: 17.7 % — ABNORMAL HIGH (ref 11.5–15.5)
WBC: 7.8 10*3/uL (ref 4.0–10.5)
nRBC: 0 % (ref 0.0–0.2)

## 2023-01-28 LAB — BASIC METABOLIC PANEL
Anion gap: 9 (ref 5–15)
BUN: 18 mg/dL (ref 6–20)
CO2: 32 mmol/L (ref 22–32)
Calcium: 9.2 mg/dL (ref 8.9–10.3)
Chloride: 101 mmol/L (ref 98–111)
Creatinine, Ser: 1.44 mg/dL — ABNORMAL HIGH (ref 0.44–1.00)
GFR, Estimated: 44 mL/min — ABNORMAL LOW (ref >60)
Glucose, Bld: 86 mg/dL (ref 70–99)
Potassium: 3.1 mmol/L — ABNORMAL LOW (ref 3.5–5.1)
Sodium: 142 mmol/L (ref 135–145)

## 2023-01-28 NOTE — ED Triage Notes (Signed)
First Nurse Note;  Pt via [facility] From [facility]. Pt was here after choking on a pt of cake, obstruction has passed. Pt is A&Ox4 and NAD 100% on RA 76 HR  151/92 BP

## 2023-01-28 NOTE — ED Triage Notes (Signed)
Pt to ed from [facility] via [facility] for choking. Pt advised she was eating food and some got stuck in her throat. She has swallowing problems. She has apt at [facility] tomorrow for esophagus stretching. Pt advised she feels like it is still stuck in her throat but she has no respiratory issues and is able to maintain her spit and swallow it.

## 2023-01-28 NOTE — ED Provider Notes (Signed)
[facility] Provider Note   Event Date/Time   First MD Initiated Contact with Patient 01/28/23 1957     (approximate) History  Choking (Cleared at this time)  HPI Diamond Rice is a 52 y.o. female with a past medical history of achalasia who presents complaining of difficulty swallowing with a sensation that food has gotten stuck in her throat.  Patient stated that earlier today she felt like she needed to vomit after eating but was unable to pass any food 1 where the other.  Patient states that when she got to our emergency department she had a large volume emesis and started feeling better.  Patient states that she still feels like she may have something stuck in her throat but has been able to tolerate her secretions much better after vomiting. ROS: Patient currently denies any vision changes, tinnitus, difficulty speaking, facial droop, sore throat, chest pain, shortness of breath, abdominal pain, diarrhea, dysuria, or weakness/numbness/paresthesias in any extremity   Physical Exam  Triage Vital Signs: ED Triage Vitals [01/28/23 1502]  Enc Vitals Group     BP (!) 141/101     Pulse Rate 90     Resp 16     Temp 98.8 F (37.1 C)     Temp Source Oral     SpO2 99 %     Weight 245 lb (111.1 kg)     Height 5\' 1"  (1.549 m)     Head Circumference      Peak Flow      Pain Score 0     Pain Loc      Pain Edu?      Excl. in [place]?    Most recent vital signs: Vitals:   01/28/23 2030 01/28/23 2100  BP: (!) 118/91 114/67  Pulse: 92 91  Resp: 20 18  Temp:    SpO2: 96% 100%   General: Awake, oriented x4. CV:  Good peripheral perfusion.  Resp:  Normal effort.  Abd:  No distention.  Other:  Morbidly obese African-American female laying in bed in no acute distress. ED Results / Procedures / Treatments  Labs (all labs ordered are listed, but only abnormal results are displayed) Labs Reviewed  CBC - Abnormal; Notable for the following components:      Result  Value   Hemoglobin 10.9 (*)    MCV 76.0 (*)    MCH 22.5 (*)    MCHC 29.6 (*)    RDW 17.7 (*)    All other components within normal limits  BASIC METABOLIC PANEL - Abnormal; Notable for the following components:   Potassium 3.1 (*)    Creatinine, Ser 1.44 (*)    GFR, Estimated 44 (*)    All other components within normal limits   EKG ED ECG REPORT I, Merwyn Katos, the attending physician, personally viewed and interpreted this ECG. Date: 01/28/2023 EKG Time: 1457 Rate: 85 Rhythm: normal sinus rhythm QRS Axis: normal Intervals: normal ST/T Wave abnormalities: normal Narrative Interpretation: no evidence of acute ischemia RADIOLOGY ED MD interpretation: Single view portable chest x-ray interpreted independently by me and shows cardiomegaly with mild central pulmonary vascular congestion -Agree with radiology assessment Official radiology report(s): DG Chest Port 1 View  Result Date: 01/28/2023 CLINICAL DATA:  Pain after choking. EXAM: PORTABLE CHEST 1 VIEW COMPARISON:  Chest x-ray 05/31/2022 FINDINGS: The heart is enlarged. There is mild central pulmonary vascular congestion. There is a band of atelectasis in the left mid lung. No pleural  effusion or pneumothorax. No acute fractures. IMPRESSION: Cardiomegaly with mild central pulmonary vascular congestion. Electronically Signed   By: Darliss Cheney M.D.   On: 01/28/2023 20:15   PROCEDURES: Critical Care performed: No .1-3 Lead EKG Interpretation  Performed by: Merwyn Katos, MD Authorized by: Merwyn Katos, MD     Interpretation: normal     ECG rate:  71   ECG rate assessment: normal     Rhythm: sinus rhythm     Ectopy: none     Conduction: normal    MEDICATIONS ORDERED IN ED: Medications - No data to display IMPRESSION / MDM / ASSESSMENT AND PLAN / ED COURSE  I reviewed the triage vital signs and the nursing notes.                             The patient is on the cardiac monitor to evaluate for evidence of  arrhythmia and/or significant heart rate changes. Patient's presentation is most consistent with acute presentation with potential threat to life or bodily function.  This patient presents to the ED for concern of difficulty swallowing with possible esophageal impaction, this involves an extensive number of treatment options, and is a complaint that carries with it a high risk of complications and morbidity.  The differential diagnosis includes esophageal foreign body, achalasia, bronchitis, pneumonia, ACS Co morbidities that complicate the patient evaluation  Achalasia history Additional history obtained:  Additional history obtained from EMS  External records from outside source obtained and reviewed including office visit on 07/03/2022 for achalasia with Dr. Gustavus Messing in gastroenterology at [facility] Lab Tests:  I Ordered, and personally interpreted labs.  The pertinent results include: Hemoglobin 10.9, potassium 3.1, creatinine 1.44 (1.22 3 months ago) Imaging Studies ordered:  I ordered imaging studies including chest x-ray  I independently visualized and interpreted imaging which showed cardiomegaly  I agree with the radiologist interpretation Cardiac Monitoring: / EKG:  The patient was maintained on a cardiac monitor.  I personally viewed and interpreted the cardiac monitored which showed an underlying rhythm of: Normal sinus rhythm Problem List / ED Course / Critical interventions / Medication management  Achalasia, dysphagia  I have reviewed the patients home medicines and have made adjustments as needed Social Determinants of Health:  Patient lives in skilled nursing facility Test / Admission - Considered:  I considered admission for this patient however obstruction seems to have passed at this time as patient is p.o. tolerant Dispo: Discharge home with gastroenterology follow-up tomorrow       FINAL CLINICAL IMPRESSION(S) / ED DIAGNOSES   Final diagnoses:  Achalasia of  esophagus  Dysphagia, unspecified type   Rx / DC Orders   ED Discharge Orders     None      Note:  This document was prepared using Dragon voice recognition software and may include unintentional dictation errors.   Merwyn Katos, MD 01/28/23 2209

## 2023-01-28 NOTE — ED Notes (Signed)
Called [facility] for transport back to [facility] 

## 2023-03-13 ENCOUNTER — Emergency Department
Admission: EM | Admit: 2023-03-13 | Discharge: 2023-03-13 | Disposition: A | Discharge status: Home / Self Care | Payer: 59 | Attending: Emergency Medicine | Admitting: Emergency Medicine

## 2023-03-13 ENCOUNTER — Other Ambulatory Visit: Payer: Self-pay

## 2023-03-13 DIAGNOSIS — R7989 Other specified abnormal findings of blood chemistry: Secondary | ICD-10-CM | POA: Diagnosis present

## 2023-03-13 DIAGNOSIS — I10 Essential (primary) hypertension: Secondary | ICD-10-CM | POA: Diagnosis not present

## 2023-03-13 DIAGNOSIS — E119 Type 2 diabetes mellitus without complications: Secondary | ICD-10-CM | POA: Diagnosis not present

## 2023-03-13 DIAGNOSIS — E860 Dehydration: Secondary | ICD-10-CM | POA: Insufficient documentation

## 2023-03-13 DIAGNOSIS — Z8673 Personal history of transient ischemic attack (TIA), and cerebral infarction without residual deficits: Secondary | ICD-10-CM | POA: Insufficient documentation

## 2023-03-13 LAB — BASIC METABOLIC PANEL
Anion gap: 10 (ref 5–15)
BUN: 25 mg/dL — ABNORMAL HIGH (ref 6–20)
CO2: 27 mmol/L (ref 22–32)
Calcium: 9.3 mg/dL (ref 8.9–10.3)
Chloride: 104 mmol/L (ref 98–111)
Creatinine, Ser: 1.7 mg/dL — ABNORMAL HIGH (ref 0.44–1.00)
GFR, Estimated: 36 mL/min — ABNORMAL LOW (ref >60)
Glucose, Bld: 165 mg/dL — ABNORMAL HIGH (ref 70–99)
Potassium: 3 mmol/L — ABNORMAL LOW (ref 3.5–5.1)
Sodium: 141 mmol/L (ref 135–145)

## 2023-03-13 LAB — CBC
HCT: 34.9 % — ABNORMAL LOW (ref 36.0–46.0)
Hemoglobin: 10.5 g/dL — ABNORMAL LOW (ref 12.0–15.0)
MCH: 22.1 pg — ABNORMAL LOW (ref 26.0–34.0)
MCHC: 30.1 g/dL (ref 30.0–36.0)
MCV: 73.3 fL — ABNORMAL LOW (ref 80.0–100.0)
Platelets: 312 10*3/uL (ref 150–400)
RBC: 4.76 MIL/uL (ref 3.87–5.11)
RDW: 17 % — ABNORMAL HIGH (ref 11.5–15.5)
WBC: 6 10*3/uL (ref 4.0–10.5)
nRBC: 0 % (ref 0.0–0.2)

## 2023-03-13 MED ORDER — SODIUM CHLORIDE 0.9 % IV SOLN: Freq: Once | INTRAVENOUS | Status: AC

## 2023-03-13 NOTE — ED Notes (Signed)
Resumed care from reina rn.  Pt alert   iv fluids infusing.   Siderails up x 2.

## 2023-03-13 NOTE — Discharge Instructions (Signed)
Your Creatinine was better in the ED at 1.7. We gave you IV fluids to improve this even more. Please focus on increased water intake and have your kidney function rechecked in 1-2 days.

## 2023-03-13 NOTE — ED Provider Notes (Signed)
[facility] Provider Note    Event Date/Time   First MD Initiated Contact with Patient 03/13/23 1256     (approximate)   History   Abnormal Lab (CR 2.24)   HPI  Diamond Rice is a 52 y.o. female with a history of hypertension, diabetes, CVA, esophageal disorder who presents with reports of elevated creatinine.  Patient had labs done yesterday, was called and notified of creatinine of 2.24, typically her creatinine is between 1 and 1.4.  She reports decreased p.o. intake because of esophageal issue, she is planning to have dilation done     Physical Exam   Triage Vital Signs: ED Triage Vitals  Encounter Vitals Group     BP 03/13/23 1313 118/86     Systolic BP Percentile --      Diastolic BP Percentile --      Pulse Rate 03/13/23 1313 78     Resp 03/13/23 1313 16     Temp 03/13/23 1313 98.4 F (36.9 C)     Temp Source 03/13/23 1313 Oral     SpO2 03/13/23 1313 97 %     Weight 03/13/23 1309 113.9 kg (251 lb)     Height 03/13/23 1309 1.549 m (5\' 1" )     Head Circumference --      Peak Flow --      Pain Score 03/13/23 1309 0     Pain Loc --      Pain Education --      Exclude from Growth Chart --     Most recent vital signs: Vitals:   03/13/23 1400 03/13/23 1430  BP: 115/82 123/81  Pulse: 71 74  Resp: 11 16  Temp:    SpO2: 97% 98%     General: Awake, no distress.  CV:  Good peripheral perfusion.  Resp:  Normal effort.  Abd:  No distention.  Other:     ED Results / Procedures / Treatments   Labs (all labs ordered are listed, but only abnormal results are displayed) Labs Reviewed  CBC - Abnormal; Notable for the following components:      Result Value   Hemoglobin 10.5 (*)    HCT 34.9 (*)    MCV 73.3 (*)    MCH 22.1 (*)    RDW 17.0 (*)    All other components within normal limits  BASIC METABOLIC PANEL - Abnormal; Notable for the following components:   Potassium 3.0 (*)    Glucose, Bld 165 (*)    BUN 25 (*)     Creatinine, Ser 1.70 (*)    GFR, Estimated 36 (*)    All other components within normal limits     EKG     RADIOLOGY     PROCEDURES:  Critical Care performed:   Procedures   MEDICATIONS ORDERED IN ED: Medications  0.9 %  sodium chloride infusion ( Intravenous New Bag/Given 03/13/23 1326)     IMPRESSION / MDM / ASSESSMENT AND PLAN / ED COURSE  I reviewed the triage vital signs and the nursing notes. Patient's presentation is most consistent with acute illness / injury with system symptoms.  Patient with reports of increased creatinine, I suspect causes decreased p.o. intake.  Creatinine here is 1.7 which is significantly improved, her BUN is elevated to consistent with dehydration, patient treated with 1 L IV fluids  No indication for admission, encourage patient to increase fluid intake and have creatinine rechecked in 1 to 2 days  She agrees with  this plan.        FINAL CLINICAL IMPRESSION(S) / ED DIAGNOSES   Final diagnoses:  Dehydration     Rx / DC Orders   ED Discharge Orders     None        Note:  This document was prepared using Dragon voice recognition software and may include unintentional dictation errors.   Jene Every, MD 03/13/23 [phone]

## 2023-03-13 NOTE — ED Triage Notes (Signed)
Pt to ED [facility] from [facility] for abnormal lab, creatinine of 2.24. Pt denies hx kidney disease but states her twin brother just died in 02-03-2023 of kidney disease. Pt states has esophageal stenosis and is on mechanical soft diet,. Pt states nauseous since last night. Pt is alert, oriented, no pain.

## 2023-03-13 NOTE — ED Notes (Signed)
Pt alert , iv fluids infusing.  Pt waiting on transport to nursing home

## 2023-03-13 NOTE — ED Notes (Signed)
Pt was repositioned in bed.

## 2023-03-13 NOTE — ED Notes (Signed)
Pt continues to wait on ems

## 2023-03-16 ENCOUNTER — Emergency Department
Admission: EM | Admit: 2023-03-16 | Discharge: 2023-03-17 | Disposition: A | Discharge status: Home / Self Care | Payer: 59 | Attending: Emergency Medicine | Admitting: Emergency Medicine

## 2023-03-16 ENCOUNTER — Emergency Department: Payer: 59

## 2023-03-16 ENCOUNTER — Other Ambulatory Visit: Payer: Self-pay

## 2023-03-16 DIAGNOSIS — R079 Chest pain, unspecified: Secondary | ICD-10-CM | POA: Insufficient documentation

## 2023-03-16 DIAGNOSIS — N189 Chronic kidney disease, unspecified: Secondary | ICD-10-CM | POA: Insufficient documentation

## 2023-03-16 DIAGNOSIS — J449 Chronic obstructive pulmonary disease, unspecified: Secondary | ICD-10-CM | POA: Insufficient documentation

## 2023-03-16 DIAGNOSIS — I129 Hypertensive chronic kidney disease with stage 1 through stage 4 chronic kidney disease, or unspecified chronic kidney disease: Secondary | ICD-10-CM | POA: Insufficient documentation

## 2023-03-16 DIAGNOSIS — E1122 Type 2 diabetes mellitus with diabetic chronic kidney disease: Secondary | ICD-10-CM | POA: Diagnosis not present

## 2023-03-16 DIAGNOSIS — R1013 Epigastric pain: Secondary | ICD-10-CM | POA: Diagnosis not present

## 2023-03-16 LAB — HEPATIC FUNCTION PANEL
ALT: 9 U/L (ref 0–44)
AST: 14 U/L — ABNORMAL LOW (ref 15–41)
Albumin: 3.6 g/dL (ref 3.5–5.0)
Alkaline Phosphatase: 90 U/L (ref 38–126)
Bilirubin, Direct: <0.1 mg/dL (ref 0.0–0.2)
Total Bilirubin: 0.5 mg/dL (ref 0.3–1.2)
Total Protein: 8.8 g/dL — ABNORMAL HIGH (ref 6.5–8.1)

## 2023-03-16 LAB — CBC
HCT: 39 % (ref 36.0–46.0)
Hemoglobin: 12 g/dL (ref 12.0–15.0)
MCH: 22.3 pg — ABNORMAL LOW (ref 26.0–34.0)
MCHC: 30.8 g/dL (ref 30.0–36.0)
MCV: 72.4 fL — ABNORMAL LOW (ref 80.0–100.0)
Platelets: 367 10*3/uL (ref 150–400)
RBC: 5.39 MIL/uL — ABNORMAL HIGH (ref 3.87–5.11)
RDW: 17 % — ABNORMAL HIGH (ref 11.5–15.5)
WBC: 7.3 10*3/uL (ref 4.0–10.5)
nRBC: 0 % (ref 0.0–0.2)

## 2023-03-16 LAB — LIPASE, BLOOD: Lipase: 37 U/L (ref 11–51)

## 2023-03-16 LAB — TROPONIN I (HIGH SENSITIVITY)
Troponin I (High Sensitivity): 3 ng/L (ref <18)
Troponin I (High Sensitivity): 3 ng/L (ref <18)

## 2023-03-16 LAB — BASIC METABOLIC PANEL
Anion gap: 11 (ref 5–15)
BUN: 16 mg/dL (ref 6–20)
CO2: 26 mmol/L (ref 22–32)
Calcium: 9.2 mg/dL (ref 8.9–10.3)
Chloride: 100 mmol/L (ref 98–111)
Creatinine, Ser: 1.37 mg/dL — ABNORMAL HIGH (ref 0.44–1.00)
GFR, Estimated: 47 mL/min — ABNORMAL LOW (ref >60)
Glucose, Bld: 139 mg/dL — ABNORMAL HIGH (ref 70–99)
Potassium: 3.4 mmol/L — ABNORMAL LOW (ref 3.5–5.1)
Sodium: 137 mmol/L (ref 135–145)

## 2023-03-16 MED ORDER — SODIUM CHLORIDE 0.9 % IV SOLN
12.5000 mg | Freq: Once | INTRAVENOUS | Status: AC
  Administered 2023-03-17: 12.5 mg via INTRAVENOUS
  Filled 2023-03-16: qty 0.5

## 2023-03-16 MED ORDER — ONDANSETRON HCL 4 MG/2ML IJ SOLN
4.0000 mg | Freq: Once | INTRAMUSCULAR | Status: AC
  Administered 2023-03-16: 4 mg via INTRAVENOUS

## 2023-03-16 MED ORDER — LIDOCAINE VISCOUS HCL 2 % MT SOLN
15.0000 mL | Freq: Once | OROMUCOSAL | Status: AC
  Administered 2023-03-16: 15 mL via ORAL
  Filled 2023-03-16: qty 15

## 2023-03-16 MED ORDER — ALUM & MAG HYDROXIDE-SIMETH 200-200-20 MG/5ML PO SUSP
30.0000 mL | Freq: Once | ORAL | Status: AC
  Administered 2023-03-16: 30 mL via ORAL
  Filled 2023-03-16: qty 30

## 2023-03-16 MED ORDER — ONDANSETRON HCL 4 MG/2ML IJ SOLN
INTRAMUSCULAR | Status: AC
  Filled 2023-03-16: qty 2

## 2023-03-16 NOTE — ED Provider Notes (Signed)
[facility] Provider Note    Event Date/Time   First MD Initiated Contact with Patient 03/16/23 2050     (approximate)   History   Chief Complaint Chest Pain   HPI  Diamond Rice is a 52 y.o. female with past medical history of hypertension, diabetes, COPD, strokes, CKD, schizoaffective disorder, and mild cognitive disability who presents to the ED complaining of chest pain.  Patient reports that just prior to arrival she had onset of sharp pain in her epigastrium and the center of her chest.  This been associated with nausea and a couple episodes of vomiting.  Patient reports that the pain is now improved but she continues to feel nauseous.  She has not had any fevers, cough, shortness of breath, dysuria, or diarrhea.     Physical Exam   Triage Vital Signs: ED Triage Vitals  Encounter Vitals Group     BP 03/16/23 1810 114/77     Systolic BP Percentile --      Diastolic BP Percentile --      Pulse Rate 03/16/23 1810 87     Resp 03/16/23 1810 16     Temp 03/16/23 1810 98 F (36.7 C)     Temp Source 03/16/23 1810 Oral     SpO2 03/16/23 1810 100 %     Weight 03/16/23 1808 249 lb 1.9 oz (113 kg)     Height 03/16/23 1808 5\' 1"  (1.549 m)     Head Circumference --      Peak Flow --      Pain Score 03/16/23 1808 0     Pain Loc --      Pain Education --      Exclude from Growth Chart --     Most recent vital signs: Vitals:   03/16/23 1810 03/16/23 2018  BP: 114/77 123/88  Pulse: 87 85  Resp: 16 14  Temp: 98 F (36.7 C) 98.6 F (37 C)  SpO2: 100% 96%    Constitutional: Alert and oriented. Eyes: Conjunctivae are normal. Head: Atraumatic. Nose: No congestion/rhinnorhea. Mouth/Throat: Mucous membranes are moist.  Cardiovascular: Normal rate, regular rhythm. Grossly normal heart sounds.  2+ radial pulses bilaterally. Respiratory: Normal respiratory effort.  No retractions. Lungs CTAB. Gastrointestinal: Soft and nontender. No  distention. Musculoskeletal: No lower extremity tenderness nor edema.  Neurologic:  Normal speech and language. No gross focal neurologic deficits are appreciated.    ED Results / Procedures / Treatments   Labs (all labs ordered are listed, but only abnormal results are displayed) Labs Reviewed  BASIC METABOLIC PANEL - Abnormal; Notable for the following components:      Result Value   Potassium 3.4 (*)    Glucose, Bld 139 (*)    Creatinine, Ser 1.37 (*)    GFR, Estimated 47 (*)    All other components within normal limits  CBC - Abnormal; Notable for the following components:   RBC 5.39 (*)    MCV 72.4 (*)    MCH 22.3 (*)    RDW 17.0 (*)    All other components within normal limits  HEPATIC FUNCTION PANEL - Abnormal; Notable for the following components:   Total Protein 8.8 (*)    AST 14 (*)    All other components within normal limits  LIPASE, BLOOD  TROPONIN I (HIGH SENSITIVITY)  TROPONIN I (HIGH SENSITIVITY)     EKG  ED ECG REPORT I, Chesley Noon, the attending physician, personally viewed and interpreted this ECG.  Date: 03/16/2023  EKG Time: 18:12  Rate: 91  Rhythm: normal sinus rhythm  Axis: Normal  Intervals: Prolonged QT  ST&T Change: None  RADIOLOGY Chest x-ray reviewed and interpreted by me with no infiltrate, edema, or effusion.  PROCEDURES:  Critical Care performed: No  Procedures   MEDICATIONS ORDERED IN ED: Medications  promethazine (PHENERGAN) 12.5 mg in sodium chloride 0.9 % 50 mL IVPB (has no administration in time range)  ondansetron (ZOFRAN) injection 4 mg ( Intravenous Canceled Entry 03/16/23 2152)  alum & mag hydroxide-simeth (MAALOX/MYLANTA) 200-200-20 MG/5ML suspension 30 mL (30 mLs Oral Given 03/16/23 2151)    And  lidocaine (XYLOCAINE) 2 % viscous mouth solution 15 mL (15 mLs Oral Given 03/16/23 2151)     IMPRESSION / MDM / ASSESSMENT AND PLAN / ED COURSE  I reviewed the triage vital signs and the nursing notes.                               52 y.o. female with past medical history of hypertension, diabetes, stroke, COPD, CKD, schizoaffective disorder, and mild cognitive disability who presents to the ED for epigastric pain, chest pain, nausea, and vomiting starting just prior to arrival.  Patient's presentation is most consistent with acute presentation with potential threat to life or bodily function.  Differential diagnosis includes, but is not limited to, ACS, PE, pneumonia, pneumothorax, GERD, pancreatitis, hepatitis, biliary colic, cholecystitis.  Patient nontoxic-appearing and in no acute distress, vital signs are unremarkable.  Her abdominal exam is benign, EKG shows no evidence of arrhythmia or ischemia.  2 sets of troponin are within normal limits and I doubt ACS, symptoms seem most consistent with GERD.  With reassuring vital signs and atypical symptoms, low suspicion for PE or dissection.  Chest x-ray is unremarkable, additional labs are reassuring with no significant anemia, leukocytosis, showed abnormality, or AKI.  LFTs and lipase are unremarkable.  We will treat with Zofran and GI cocktail, reassess with p.o. challenge.  Patient with ongoing nausea despite Zofran, states she typically takes Phenergan at home.  Will give dose of IV Phenergan and p.o. challenge.  Patient turned over to on provider pending p.o. challenge and reassessment.      FINAL CLINICAL IMPRESSION(S) / ED DIAGNOSES   Final diagnoses:  Nonspecific chest pain  Epigastric pain     Rx / DC Orders   ED Discharge Orders     None        Note:  This document was prepared using Dragon voice recognition software and may include unintentional dictation errors.   Chesley Noon, MD 03/16/23 [phone]

## 2023-03-16 NOTE — ED Triage Notes (Signed)
Pt to ed from [facility] for CP . Pt was just seen here 7/16. Pt is caox4, in no acute distress. Pt denies dizziness and SOB. Staff of facility gave 3 SL NTG.   159/93 149 BGL 88 HR  100% on RA.

## 2023-03-17 DIAGNOSIS — R079 Chest pain, unspecified: Secondary | ICD-10-CM | POA: Diagnosis not present

## 2023-03-17 NOTE — ED Notes (Signed)
[facility] [phone]. No answer left VM informed pt is ready for discharge.

## 2023-05-10 ENCOUNTER — Other Ambulatory Visit (INDEPENDENT_AMBULATORY_CARE_PROVIDER_SITE_OTHER): Payer: Self-pay | Admitting: Nurse Practitioner

## 2023-05-10 DIAGNOSIS — Z9889 Other specified postprocedural states: Secondary | ICD-10-CM

## 2023-05-17 ENCOUNTER — Ambulatory Visit (INDEPENDENT_AMBULATORY_CARE_PROVIDER_SITE_OTHER): Payer: Medicare Other | Admitting: Vascular Surgery

## 2023-05-17 ENCOUNTER — Encounter (INDEPENDENT_AMBULATORY_CARE_PROVIDER_SITE_OTHER): Payer: Medicare Other

## 2023-05-17 DIAGNOSIS — I70219 Atherosclerosis of native arteries of extremities with intermittent claudication, unspecified extremity: Secondary | ICD-10-CM | POA: Insufficient documentation

## 2023-05-17 NOTE — Progress Notes (Deleted)
MRN : [id number]  Diamond Rice is a 51 y.o. (1971/01/24) female who presents with chief complaint of check circulation.  History of Present Illness:   The patient  returns today for noninvasive studies of her for arterial disease.  The patient has left-sided weakness.  She notes that she has not had any worsening pain or symptoms of her lower extremity.  Her largest issues have been her stomach and having abdominal pain she does not walk frequently.  But she denies any rest pain like symptoms.  She also denies having any open wounds or ulcerations.   Today noninvasive studies show an ABI of 1.27 on the right and 1.08 on the left.  The patient has biphasic tibial artery cords bilaterally with good toe waveforms bilaterally.  Been consistent for the last year.  No outpatient medications have been marked as taking for the 05/17/23 encounter (Appointment) with Gilda Crease, Latina Craver, MD.    Past Medical History:  Diagnosis Date   Acute ischemic stroke (HCC) 12/27/2020   Acute renal failure (ARF) (HCC) 04/02/2018   Anxiety    Chronic lower back pain 04/17/2012   "just got over some; catched when I walked"   COPD (chronic obstructive pulmonary disease) (HCC)    Critical lower limb ischemia (HCC) 05/27/2018   Depression    Headache(784.0) 04/17/2012   "~ qod; lately waking up in am w/one"   High cholesterol    Hypertension    Migraines 04/17/2012   Obesity    Sleep apnea    CPAP   Stroke (HCC)    Type II diabetes mellitus (HCC) 08/28/2002    Past Surgical History:  Procedure Laterality Date   ABDOMINAL AORTOGRAM W/LOWER EXTREMITY Left 05/27/2018   Procedure: ABDOMINAL AORTOGRAM W/LOWER EXTREMITY Runoff and Possible Intervention;  Surgeon: Maeola Harman, MD;  Location: [facility] INVASIVE CV LAB;  Service: Cardiovascular;  Laterality: Left;   APPLICATION OF WOUND VAC Left 06/06/2018   Procedure: APPLICATION OF WOUND  VAC;  Surgeon: Maeola Harman, MD;  Location: [facility] OR;  Service: Vascular;  Laterality: Left;   BALLOON DILATION N/A 12/31/2020   Procedure: [place];  Surgeon: Kerin Salen, MD;  Location: [facility] ENDOSCOPY;  Service: Gastroenterology;  Laterality: N/A;   BIOPSY  04/04/2018   Procedure: BIOPSY;  Surgeon: Lynann Bologna, MD;  Location: [facility] ENDOSCOPY;  Service: Endoscopy;;   BIOPSY  10/27/2020   Procedure: BIOPSY;  Surgeon: Willis Modena, MD;  Location: [facility] ENDOSCOPY;  Service: Endoscopy;;   BOTOX INJECTION N/A 12/31/2020   Procedure: BOTOX INJECTION;  Surgeon: Kerin Salen, MD;  Location: [facility] ENDOSCOPY;  Service: Gastroenterology;  Laterality: N/A;   CARDIAC CATHETERIZATION  ~ 2011   COLONOSCOPY N/A 04/04/2018   Procedure: COLONOSCOPY;  Surgeon: Lynann Bologna, MD;  Location: [facility] ENDOSCOPY;  Service: Endoscopy;  Laterality: N/A;   COLONOSCOPY WITH PROPOFOL N/A 10/27/2020   Procedure: COLONOSCOPY WITH PROPOFOL;  Surgeon: Willis Modena, MD;  Location: [facility] ENDOSCOPY;  Service: Endoscopy;  Laterality: N/A;   ESOPHAGOGASTRODUODENOSCOPY N/A 12/31/2020   Procedure: ESOPHAGOGASTRODUODENOSCOPY (EGD);  Surgeon: Kerin Salen, MD;  Location: [facility] ENDOSCOPY;  Service: Gastroenterology;  Laterality: N/A;   ESOPHAGOGASTRODUODENOSCOPY (EGD) WITH PROPOFOL N/A 10/27/2020   Procedure: ESOPHAGOGASTRODUODENOSCOPY (EGD) WITH PROPOFOL  WITH POSSIBLE DIL;  Surgeon: Willis Modena, MD;  Location: [facility] ENDOSCOPY;  Service: Endoscopy;  Laterality: N/A;   ESOPHAGOGASTRODUODENOSCOPY (EGD) WITH PROPOFOL N/A 10/19/2021   Procedure: ESOPHAGOGASTRODUODENOSCOPY (EGD) WITH PROPOFOL;  Surgeon: Toney Reil, MD;  Location: [facility] ENDOSCOPY;  Service: Gastroenterology;  Laterality: N/A;   ESOPHAGOGASTRODUODENOSCOPY (EGD) WITH PROPOFOL N/A 06/02/2022   Procedure: ESOPHAGOGASTRODUODENOSCOPY (EGD) WITH PROPOFOL;  Surgeon: Wyline Mood, MD;  Location: [facility] ENDOSCOPY;  Service: Gastroenterology;  Laterality: N/A;   FOREIGN BODY REMOVAL  12/31/2020    Procedure: FOREIGN BODY REMOVAL;  Surgeon: Kerin Salen, MD;  Location: [facility] ENDOSCOPY;  Service: Gastroenterology;;   LOWER EXTREMITY ANGIOGRAM Left 05/27/2018   PERIPHERAL VASCULAR INTERVENTION Left 05/27/2018   Procedure: PERIPHERAL VASCULAR INTERVENTION;  Surgeon: Maeola Harman, MD;  Location: [facility] INVASIVE CV LAB;  Service: Cardiovascular;  Laterality: Left;   POLYPECTOMY  04/04/2018   Procedure: POLYPECTOMY;  Surgeon: Lynann Bologna, MD;  Location: [facility] ENDOSCOPY;  Service: Endoscopy;;   POLYPECTOMY  10/27/2020   Procedure: POLYPECTOMY;  Surgeon: Willis Modena, MD;  Location: [facility] ENDOSCOPY;  Service: Endoscopy;;   TRANSMETATARSAL AMPUTATION Left 05/28/2018   Procedure: AMPUTATION TOES THREE, FOUR AND FIVE on left foot;  Surgeon: Maeola Harman, MD;  Location: [facility] OR;  Service: Vascular;  Laterality: Left;   WOUND DEBRIDEMENT Left 06/06/2018   Procedure: DEBRIDEMENT WOUND LEFT FOOT;  Surgeon: Maeola Harman, MD;  Location: [facility] OR;  Service: Vascular;  Laterality: Left;    Social History Social History   Tobacco Use   Smoking status: Every Day    Current packs/day: 0.25    Average packs/day: 0.3 packs/day for 28.0 years (7.0 ttl pk-yrs)    Types: Cigarettes   Smokeless tobacco: Never  Vaping Use   Vaping status: Never Used  Substance Use Topics   Alcohol use: No    Alcohol/week: 0.0 standard drinks of alcohol    Comment: rare   Drug use: Yes    Types: Marijuana, "Crack" cocaine    Comment: no drugs for past year    Family History Family History  Problem Relation Age of Onset   Stroke Brother 27   Stroke Brother 50   Heart attack Mother 36   Stroke Father 24    Allergies  Allergen Reactions   Morphine And Codeine Hives   Metformin Diarrhea and Other (See Comments)    "Allergic," per [facility]     REVIEW OF SYSTEMS (Negative unless checked)  Constitutional: [] Weight loss  [] Fever  [] Chills Cardiac: [] Chest pain   [] Chest pressure    [] Palpitations   [] Shortness of breath when laying flat   [] Shortness of breath with exertion. Vascular:  [x] Pain in legs with walking   [] Pain in legs at rest  [] History of DVT   [] Phlebitis   [] Swelling in legs   [] Varicose veins   [] Non-healing ulcers Pulmonary:   [] Uses home oxygen   [] Productive cough   [] Hemoptysis   [] Wheeze  [] COPD   [] Asthma Neurologic:  [] Dizziness   [] Seizures   [] History of stroke   [] History of TIA  [] Aphasia   [] Vissual changes   [] Weakness or numbness in arm   [] Weakness or numbness in leg Musculoskeletal:   [] Joint swelling   [] Joint pain   [] Low back pain Hematologic:  [] Easy bruising  [] Easy bleeding   [] Hypercoagulable state   [] Anemic Gastrointestinal:  [] Diarrhea   [] Vomiting  [] Gastroesophageal reflux/heartburn   []   Difficulty swallowing. Genitourinary:  [] Chronic kidney disease   [] Difficult urination  [] Frequent urination   [] Blood in urine Skin:  [] Rashes   [] Ulcers  Psychological:  [] History of anxiety   []  History of major depression.  Physical Examination  There were no vitals filed for this visit. There is no height or weight on file to calculate BMI. Gen: WD/WN, NAD Head: [place]/AT, No temporalis wasting.  Ear/Nose/Throat: Hearing grossly intact, nares w/o erythema or drainage Eyes: PER, EOMI, sclera nonicteric.  Neck: Supple, no masses.  No bruit or JVD.  Pulmonary:  Good air movement, no audible wheezing, no use of accessory muscles.  Cardiac: RRR, normal S1, S2, no Murmurs. Vascular:  mild trophic changes, no open wounds Vessel Right Left  Radial Palpable Palpable  PT Not Palpable Not Palpable  DP Not Palpable Not Palpable  Gastrointestinal: soft, non-distended. No guarding/no peritoneal signs.  Musculoskeletal: M/S 5/5 throughout.  No visible deformity.  Neurologic: CN 2-12 intact. Pain and light touch intact in extremities.  Symmetrical.  Speech is fluent. Motor exam as listed above. Psychiatric: Judgment intact, Mood & affect appropriate  for pt's clinical situation. Dermatologic: No rashes or ulcers noted.  No changes consistent with cellulitis.   CBC Lab Results  Component Value Date   WBC 7.3 03/16/2023   HGB 12.0 03/16/2023   HCT 39.0 03/16/2023   MCV 72.4 (L) 03/16/2023   PLT 367 03/16/2023    BMET    Component Value Date/Time   NA 137 03/16/2023 1809   NA 141 12/17/2019 0000   K 3.4 (L) 03/16/2023 1809   CL 100 03/16/2023 1809   CO2 26 03/16/2023 1809   GLUCOSE 139 (H) 03/16/2023 1809   BUN 16 03/16/2023 1809   BUN 14 12/17/2019 0000   CREATININE 1.37 (H) 03/16/2023 1809   CREATININE 1.81 (H) 05/23/2018 1620   CALCIUM 9.2 03/16/2023 1809   GFRNONAA 47 (L) 03/16/2023 1809   GFRNONAA 54 (L) 09/28/2014 1034   GFRAA >60 02/17/2020 1940   GFRAA 62 09/28/2014 1034   CrCl cannot be calculated (Patient's most recent lab result is older than the maximum 21 days allowed.).  COAG Lab Results  Component Value Date   INR 0.9 01/16/2021   INR 1.0 12/27/2020   INR 1.0 12/06/2019    Radiology No results found.   Assessment/Plan There are no diagnoses linked to this encounter.   Levora Dredge, MD  05/17/2023 12:10 PM

## 2023-07-02 ENCOUNTER — Ambulatory Visit (INDEPENDENT_AMBULATORY_CARE_PROVIDER_SITE_OTHER): Payer: 59 | Admitting: Vascular Surgery

## 2023-07-02 ENCOUNTER — Ambulatory Visit (INDEPENDENT_AMBULATORY_CARE_PROVIDER_SITE_OTHER): Payer: 59

## 2023-07-02 ENCOUNTER — Encounter (INDEPENDENT_AMBULATORY_CARE_PROVIDER_SITE_OTHER): Payer: Self-pay | Admitting: Vascular Surgery

## 2023-07-02 VITALS — BP 129/89 | HR 77 | Resp 18 | Ht 61.0 in | Wt 249.0 lb

## 2023-07-02 DIAGNOSIS — I10 Essential (primary) hypertension: Secondary | ICD-10-CM | POA: Diagnosis not present

## 2023-07-02 DIAGNOSIS — I70219 Atherosclerosis of native arteries of extremities with intermittent claudication, unspecified extremity: Secondary | ICD-10-CM

## 2023-07-02 DIAGNOSIS — R600 Localized edema: Secondary | ICD-10-CM | POA: Diagnosis not present

## 2023-07-02 DIAGNOSIS — I739 Peripheral vascular disease, unspecified: Secondary | ICD-10-CM | POA: Diagnosis not present

## 2023-07-02 DIAGNOSIS — Z9889 Other specified postprocedural states: Secondary | ICD-10-CM | POA: Diagnosis not present

## 2023-07-02 DIAGNOSIS — Z794 Long term (current) use of insulin: Secondary | ICD-10-CM

## 2023-07-02 DIAGNOSIS — E119 Type 2 diabetes mellitus without complications: Secondary | ICD-10-CM

## 2023-07-02 DIAGNOSIS — E7849 Other hyperlipidemia: Secondary | ICD-10-CM

## 2023-07-02 LAB — VAS US ABI WITH/WO TBI
Left ABI: 1.05
Right ABI: 1.13

## 2023-07-02 NOTE — Progress Notes (Addendum)
MRN : [id number]  Diamond Rice is a 52 y.o. (August 14, 1971) female who presents with chief complaint of check circulation.  History of Present Illness:   The patient returns to the office for followup regarding atherosclerotic changes of the lower extremities and review of the noninvasive studies.   There have been no interval changes in lower extremity symptoms. No interval shortening of the patient's claudication distance or development of rest pain symptoms. No new ulcers or wounds have occurred since the last visit.  There have been no significant changes to the patient's overall health care.  The patient denies amaurosis fugax or recent TIA symptoms. There are no documented recent neurological changes noted. There is no history of DVT, PE or superficial thrombophlebitis. The patient denies recent episodes of angina or shortness of breath.   ABI Rt=1.13 and Lt=1.05  (previous ABI's Rt=1.27 and Lt=1.08)    Current Meds  Medication Sig   ALPRAZolam (XANAX) 0.25 MG tablet Take 0.25 mg by mouth 3 (three) times daily as needed for anxiety or sleep.   amLODipine (NORVASC) 10 MG tablet Take 10 mg by mouth daily.   aspirin EC 81 MG EC tablet Take 1 tablet (81 mg total) by mouth daily.   atorvastatin (LIPITOR) 40 MG tablet Take 2 tablets (80 mg total) by mouth daily at 6 PM.   baclofen (LIORESAL) 10 MG tablet Take 0.5 tablets (5 mg total) by mouth 2 (two) times daily as needed for muscle spasms.   carboxymethylcellulose (REFRESH PLUS) 0.5 % SOLN 1 drop at bedtime.   carvedilol (COREG) 12.5 MG tablet Take 12.5 mg by mouth 2 (two) times daily with a meal.   cloNIDine (CATAPRES - DOSED IN MG/24 HR) 0.3 mg/24hr patch 0.3 mg once a week.   famotidine (PEPCID) 40 MG tablet Take 40 mg by mouth daily.   feeding supplement (ENSURE ENLIVE / ENSURE PLUS) LIQD Take 237 mLs by mouth 2 (two) times daily between meals.    FLUoxetine (PROZAC) 40 MG capsule Take 40 mg by mouth daily.   hydrochlorothiazide (HYDRODIURIL) 25 MG tablet Take 25 mg by mouth daily.   ibuprofen (ADVIL) 800 MG tablet Take 800 mg by mouth 3 (three) times daily.   insulin aspart (NOVOLOG) 100 UNIT/ML injection Inject 0-9 Units into the skin 3 (three) times daily with meals. Correction coverage: Sensitive (thin, NPO, renal) CBG < 70: Implement Hypoglycemia protocol. CBG 70 - 120: 0 units CBG 121 - 150: 1 unit CBG 151 - 200: 2 units CBG 201 - 250: 3 units CBG 251 - 300: 5 units CBG 301 - 350: 7 units CBG 351 - 400: 9 units CBG > 400: call MD.   LANTUS SOLOSTAR 100 UNIT/ML Solostar Pen Inject into the skin.   lidocaine (XYLOCAINE) 1 % (with preservative) injection SMARTSIG:Rectally   lidocaine, PF, (XYLOCAINE) 1 % SOLN injection    lisinopril (ZESTRIL) 20 MG tablet Take 1 tablet (20 mg total) by mouth daily.   LYUMJEV KWIKPEN 100 UNIT/ML KwikPen Inject into the skin.   magnesium hydroxide (MILK OF MAGNESIA) 400 MG/5ML suspension Take by mouth daily as  needed for mild constipation.   Melatonin 10 MG CAPS Take by mouth.   metoCLOPramide (REGLAN) 10 MG/10ML SOLN Take 10 mLs (10 mg total) by mouth 3 (three) times daily before meals.   mirtazapine (REMERON) 30 MG tablet Take 30 mg by mouth at bedtime.   ondansetron (ZOFRAN) 4 MG tablet Take 8 mg by mouth 2 (two) times daily.   ondansetron (ZOFRAN) 4 MG/2ML SOLN injection    OZEMPIC, 1 MG/DOSE, 4 MG/3ML SOPN Inject into the skin.   polyethylene glycol (MIRALAX / GLYCOLAX) 17 g packet Take 17 g by mouth daily.   potassium chloride SA (KLOR-CON M) 20 MEQ tablet Take 20 mEq by mouth daily.   promethazine (PHENERGAN) 25 MG/ML injection Inject 25 mg into the muscle daily.   senna (SENOKOT) 8.6 MG tablet Take 1 tablet by mouth daily.   traMADol (ULTRAM) 50 MG tablet Take 50 mg by mouth every 6 (six) hours as needed.   TRULICITY 0.75 MG/0.5ML SOPN Inject into the skin.   zinc oxide 20 % ointment  Apply 1 Application topically as needed for irritation.    Past Medical History:  Diagnosis Date   Acute ischemic stroke (HCC) 12/27/2020   Acute renal failure (ARF) (HCC) 04/02/2018   Anxiety    Chronic lower back pain 04/17/2012   "just got over some; catched when I walked"   COPD (chronic obstructive pulmonary disease) (HCC)    Critical lower limb ischemia (HCC) 05/27/2018   Depression    Headache(784.0) 04/17/2012   "~ qod; lately waking up in am w/one"   High cholesterol    Hypertension    Migraines 04/17/2012   Obesity    Sleep apnea    CPAP   Stroke (HCC)    Type II diabetes mellitus (HCC) 08/28/2002    Past Surgical History:  Procedure Laterality Date   ABDOMINAL AORTOGRAM W/LOWER EXTREMITY Left 05/27/2018   Procedure: ABDOMINAL AORTOGRAM W/LOWER EXTREMITY Runoff and Possible Intervention;  Surgeon: Maeola Harman, MD;  Location: [facility] INVASIVE CV LAB;  Service: Cardiovascular;  Laterality: Left;   APPLICATION OF WOUND VAC Left 06/06/2018   Procedure: APPLICATION OF WOUND VAC;  Surgeon: Maeola Harman, MD;  Location: [facility] OR;  Service: Vascular;  Laterality: Left;   BALLOON DILATION N/A 12/31/2020   Procedure: [place];  Surgeon: Kerin Salen, MD;  Location: [facility] ENDOSCOPY;  Service: Gastroenterology;  Laterality: N/A;   BIOPSY  04/04/2018   Procedure: BIOPSY;  Surgeon: Lynann Bologna, MD;  Location: [facility] ENDOSCOPY;  Service: Endoscopy;;   BIOPSY  10/27/2020   Procedure: BIOPSY;  Surgeon: Willis Modena, MD;  Location: [facility] ENDOSCOPY;  Service: Endoscopy;;   BOTOX INJECTION N/A 12/31/2020   Procedure: BOTOX INJECTION;  Surgeon: Kerin Salen, MD;  Location: [facility] ENDOSCOPY;  Service: Gastroenterology;  Laterality: N/A;   CARDIAC CATHETERIZATION  ~ 2011   COLONOSCOPY N/A 04/04/2018   Procedure: COLONOSCOPY;  Surgeon: Lynann Bologna, MD;  Location: [facility] ENDOSCOPY;  Service: Endoscopy;  Laterality: N/A;   COLONOSCOPY WITH PROPOFOL N/A 10/27/2020   Procedure: COLONOSCOPY WITH  PROPOFOL;  Surgeon: Willis Modena, MD;  Location: [facility] ENDOSCOPY;  Service: Endoscopy;  Laterality: N/A;   ESOPHAGOGASTRODUODENOSCOPY N/A 12/31/2020   Procedure: ESOPHAGOGASTRODUODENOSCOPY (EGD);  Surgeon: Kerin Salen, MD;  Location: [facility] ENDOSCOPY;  Service: Gastroenterology;  Laterality: N/A;   ESOPHAGOGASTRODUODENOSCOPY (EGD) WITH PROPOFOL N/A 10/27/2020   Procedure: ESOPHAGOGASTRODUODENOSCOPY (EGD) WITH PROPOFOL  WITH POSSIBLE DIL;  Surgeon: Willis Modena, MD;  Location: [facility] ENDOSCOPY;  Service: Endoscopy;  Laterality: N/A;   ESOPHAGOGASTRODUODENOSCOPY (  EGD) WITH PROPOFOL N/A 10/19/2021   Procedure: ESOPHAGOGASTRODUODENOSCOPY (EGD) WITH PROPOFOL;  Surgeon: Toney Reil, MD;  Location: [name] ENDOSCOPY;  Service: Gastroenterology;  Laterality: N/A;   ESOPHAGOGASTRODUODENOSCOPY (EGD) WITH PROPOFOL N/A 06/02/2022   Procedure: ESOPHAGOGASTRODUODENOSCOPY (EGD) WITH PROPOFOL;  Surgeon: Wyline Mood, MD;  Location: [facility] ENDOSCOPY;  Service: Gastroenterology;  Laterality: N/A;   FOREIGN BODY REMOVAL  12/31/2020   Procedure: FOREIGN BODY REMOVAL;  Surgeon: Kerin Salen, MD;  Location: [facility] ENDOSCOPY;  Service: Gastroenterology;;   LOWER EXTREMITY ANGIOGRAM Left 05/27/2018   PERIPHERAL VASCULAR INTERVENTION Left 05/27/2018   Procedure: PERIPHERAL VASCULAR INTERVENTION;  Surgeon: Maeola Harman, MD;  Location: [facility] INVASIVE CV LAB;  Service: Cardiovascular;  Laterality: Left;   POLYPECTOMY  04/04/2018   Procedure: POLYPECTOMY;  Surgeon: Lynann Bologna, MD;  Location: [facility] ENDOSCOPY;  Service: Endoscopy;;   POLYPECTOMY  10/27/2020   Procedure: POLYPECTOMY;  Surgeon: Willis Modena, MD;  Location: [facility] ENDOSCOPY;  Service: Endoscopy;;   TRANSMETATARSAL AMPUTATION Left 05/28/2018   Procedure: AMPUTATION TOES THREE, FOUR AND FIVE on left foot;  Surgeon: Maeola Harman, MD;  Location: [facility] OR;  Service: Vascular;  Laterality: Left;   WOUND DEBRIDEMENT Left 06/06/2018   Procedure: DEBRIDEMENT WOUND LEFT  FOOT;  Surgeon: Maeola Harman, MD;  Location: [facility] OR;  Service: Vascular;  Laterality: Left;    Social History Social History   Tobacco Use   Smoking status: Every Day    Current packs/day: 0.25    Average packs/day: 0.3 packs/day for 28.0 years (7.0 ttl pk-yrs)    Types: Cigarettes   Smokeless tobacco: Never  Vaping Use   Vaping status: Never Used  Substance Use Topics   Alcohol use: No    Alcohol/week: 0.0 standard drinks of alcohol    Comment: rare   Drug use: Yes    Types: Marijuana, "Crack" cocaine    Comment: no drugs for past year    Family History Family History  Problem Relation Age of Onset   Stroke Brother 44   Stroke Brother 50   Heart attack Mother 76   Stroke Father 63    Allergies  Allergen Reactions   Morphine And Codeine Hives   Metformin Diarrhea and Other (See Comments)    "Allergic," per [facility]     REVIEW OF SYSTEMS (Negative unless checked)  Constitutional: [] Weight loss  [] Fever  [] Chills Cardiac: [] Chest pain   [] Chest pressure   [] Palpitations   [] Shortness of breath when laying flat   [] Shortness of breath with exertion. Vascular:  [x] Pain in legs with walking   [] Pain in legs at rest  [] History of DVT   [] Phlebitis   [] Swelling in legs   [] Varicose veins   [] Non-healing ulcers Pulmonary:   [] Uses home oxygen   [] Productive cough   [] Hemoptysis   [] Wheeze  [] COPD   [] Asthma Neurologic:  [] Dizziness   [] Seizures   [] History of stroke   [] History of TIA  [] Aphasia   [] Vissual changes   [] Weakness or numbness in arm   [] Weakness or numbness in leg Musculoskeletal:   [] Joint swelling   [] Joint pain   [] Low back pain Hematologic:  [] Easy bruising  [] Easy bleeding   [] Hypercoagulable state   [] Anemic Gastrointestinal:  [] Diarrhea   [] Vomiting  [] Gastroesophageal reflux/heartburn   [] Difficulty swallowing. Genitourinary:  [] Chronic kidney disease   [] Difficult urination  [] Frequent urination   [] Blood in urine Skin:  [] Rashes   [] Ulcers   Psychological:  [] History of anxiety   []  History of major depression.  Physical Examination  There were no vitals filed for this visit. There is no height or weight on file to calculate BMI. Gen: WD/WN, NAD Head: [place]/AT, No temporalis wasting.  Ear/Nose/Throat: Hearing grossly intact, nares w/o erythema or drainage Eyes: PER, EOMI, sclera nonicteric.  Neck: Supple, no masses.  No bruit or JVD.  Pulmonary:  Good air movement, no audible wheezing, no use of accessory muscles.  Cardiac: RRR, normal S1, S2, no Murmurs. Vascular:  mild trophic changes, no open wounds Vessel Right Left  Radial Palpable Palpable  PT  Palpable Palpable  DP Not Palpable Not Palpable  Gastrointestinal: soft, non-distended. No guarding/no peritoneal signs.  Musculoskeletal: M/S 5/5 throughout.  No visible deformity.  Neurologic: CN 2-12 intact. Pain and light touch intact in extremities.  Symmetrical.  Speech is fluent. Motor exam as listed above. Psychiatric: Judgment intact, Mood & affect appropriate for pt's clinical situation. Dermatologic: No rashes or ulcers noted.  No changes consistent with cellulitis.   CBC Lab Results  Component Value Date   WBC 7.3 03/16/2023   HGB 12.0 03/16/2023   HCT 39.0 03/16/2023   MCV 72.4 (L) 03/16/2023   PLT 367 03/16/2023    BMET    Component Value Date/Time   NA 137 03/16/2023 1809   NA 141 12/17/2019 0000   K 3.4 (L) 03/16/2023 1809   CL 100 03/16/2023 1809   CO2 26 03/16/2023 1809   GLUCOSE 139 (H) 03/16/2023 1809   BUN 16 03/16/2023 1809   BUN 14 12/17/2019 0000   CREATININE 1.37 (H) 03/16/2023 1809   CREATININE 1.81 (H) 05/23/2018 1620   CALCIUM 9.2 03/16/2023 1809   GFRNONAA 47 (L) 03/16/2023 1809   GFRNONAA 54 (L) 09/28/2014 1034   GFRAA >60 02/17/2020 1940   GFRAA 62 09/28/2014 1034   CrCl cannot be calculated (Patient's most recent lab result is older than the maximum 21 days allowed.).  COAG Lab Results  Component Value Date   INR 0.9  01/16/2021   INR 1.0 12/27/2020   INR 1.0 12/06/2019    Radiology No results found.   Assessment/Plan 1. Atherosclerosis of artery of extremity with intermittent claudication (HCC)  Recommend:  The patient has evidence of atherosclerosis of the lower extremities with claudication.  The patient does not voice lifestyle limiting changes at this point in time.  Noninvasive studies do not suggest clinically significant change.  No invasive studies, angiography or surgery at this time The patient should continue walking and begin a more formal exercise program.  The patient should continue antiplatelet therapy and aggressive treatment of the lipid abnormalities  No changes in the patient's medications at this time  Continued surveillance is indicated as atherosclerosis is likely to progress with time.    The patient will continue follow up with noninvasive studies as ordered.  - VAS [place] ABI WITH/WO TBI; Future  2. Primary hypertension Continue antihypertensive medications as already ordered, these medications have been reviewed and there are no changes at this time.  3. Edema of lower extremity Recommend:  No surgery or intervention at this point in time.  I have reviewed my discussion with the patient regarding venous insufficiency and why it causes symptoms. I have discussed with the patient the chronic skin changes that accompany venous insufficiency and the long term sequela such as ulceration. Patient will contnue wearing graduated compression stockings on a daily basis, as this has provided excellent control of his edema. The patient will put the stockings on first thing in the morning  and removing them in the evening. The patient is reminded not to sleep in the stockings.  In addition, behavioral modification including elevation during the day will be initiated. Exercise is strongly encouraged.  Previous duplex ultrasound of the lower extremities shows normal deep system, no  significant superficial reflux was identified.  Given the patient's good control and lack of any problems regarding the venous insufficiency and lymphedema a lymph pump in not need at this time.    4. Other hyperlipidemia Continue statin as ordered and reviewed, no changes at this time  5. Type 2 diabetes mellitus without complication, with long-term current use of insulin (HCC) Continue hypoglycemic medications as already ordered, these medications have been reviewed and there are no changes at this time.  Hgb A1C to be monitored as already arranged by primary service    Levora Dredge, MD  07/02/2023 11:46 AM

## 2023-07-08 ENCOUNTER — Encounter (INDEPENDENT_AMBULATORY_CARE_PROVIDER_SITE_OTHER): Payer: Self-pay | Admitting: Vascular Surgery

## 2023-09-03 NOTE — Progress Notes (Signed)
 [facility] RETURN VISIT NOTE   Initial Referring Provider: Home Health Provider, Not In System No address on file  Primary Care Provider: Home Health Provider, Not In System  DATE OF SERVICE: 09/03/2023   REASON FOR VISIT: Follow up achalasia s/p pneumatic dilation 06/16/22 .  HISTORY OF PRESENT ILLNESS:Diamond Rice is a 53 y.o. 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   Since I last saw her in clinic in November, she was hospitalized with complete dysphagia in early December. We performed EGD on 08/03/23 which revealed a food impaction but no discrete stricture. Food was removed.  EndoFLIP showed essentially normal LES distensibility (DI 3.0-4.2).    Since the food was removed on 12/6 she has no been on a soft/chopped diet and has not had any dysphagia or feeling of food sticking. No recent regurgitation. No heartburn on BID PPI.   Previous work up Barium swallow 01/29/23:-Abnormal study with dilated, debris-filled esophagus. Contrast did not pass from the esophagus to the stomach. Differential includes recurrence of achalasia. Recommend GI consultation for further management recommendations.  04/25/22 HREM at [facility]: IRP 42, 100 simultaneous contractions, no peristalsis. This was read as achalasia type 2. 02/2022: CT abd: dilated esophagus with retained contrast 01/2022: CT neck - dilated thoracic esophagus 09/2021 - EGD normal esophagus, no stricture 12/2020 MBBS: no significant aspiration, regular diet thin liquids recommended 12/2020 BS: distal esophageal narrowing with upstream dilation and esophageal dysmotility 10/2020 EGD: normal esophagus   Eckardt Score Date of assessment: 09/03/2023  Parameter Score  Dysphagia 0-None  Regurgitation 0-None   Retrosternal pain 0-None  Weight loss (lbs) 0: None  Total 0     ROS: A 10 point review of systems was normal except as noted in the HPI above.  PAST MEDICAL HISTORY: Past Medical History:  Diagnosis Date  . Achalasia   . Acute kidney failure (CMS-HCC)   . Cerebrovascular accident (CMS-HCC)   . Controlled diabetes mellitus type II without complication (CMS-HCC)   . COPD (chronic obstructive pulmonary disease) (CMS-HCC)   . Critical lower limb ischemia (CMS-HCC)   . Essential hypertension   . Hyperlipidemia   . Left ventricular hypertrophy   . Polysubstance abuse (CMS-HCC)   . Pulmonary embolism (CMS-HCC)   . Sleep apnea    uses CPAP  . Urinary incontinence     PAST SURGICAL HISTORY: Past Surgical History:  Procedure Laterality Date  . BALLOON DILATION PROSTATIC URETHRA    . CARDIAC CATHETERIZATION    . COLONOSCOPY    . ESOPHAGOGASTRODUODENOSCOPY    . FOOT AMPUTATION THROUGH METATARSAL Left   . LOWER EXTREMITY ANGIOGRAM    . PERIPHERAL VASCULAR INTERVENTION    . POLYPECTOMY    . PR EGD ESOPHAGUS BALLOON DILATION 30 MM OR LARGER N/A 06/16/2022   Procedure: ESOPHAGOGASTRODUODENOSCOPY, FLEXIBLE, TRANSORAL; WITH DILATION OF ESOPHAGUS WITH BALLOON (30 MM DIAMETER OR LARGER);  Surgeon: Gean Toribio Righter, MD;  Location: [facility];  Service: Gastroenterology  . PR EGD ESOPHAGUS BALLOON DILATION 30 MM OR LARGER  03/27/2023   Procedure: ESOPHAGOGASTRODUODENOSCOPY, FLEXIBLE, TRANSORAL; WITH DILATION OF ESOPHAGUS WITH BALLOON (30 MM DIAMETER OR LARGER);  Surgeon: Gean Toribio Righter, MD;  Location: [facility];  Service: Gastroenterology  . PR EGD FLEXIBLE FOREIGN BODY REMOVAL N/A 03/27/2023  Procedure: UGI ENDOSCOPY; W/REMOVAL FOREIGN BODY;  Surgeon: Gean Toribio Righter, MD;  Location: GI [facility];  Service: Gastroenterology  . PR EGD FLEXIBLE FOREIGN BODY REMOVAL N/A 08/03/2023   Procedure: UGI ENDOSCOPY; W/REMOVAL FOREIGN BODY;   Surgeon: Junette Fonda CROME, MD;  Location: [facility];  Service: Gastroenterology  . PR ESOPHAGEAL MOTILITY STUDY, MANOMETRY N/A 04/25/2022   Procedure: ESOPHAGEAL MOTILITY STUDY W/INT & REP;  Surgeon: Nurse-Based Giproc;  Location: [facility];  Service: Gastroenterology  . PR ESOPHGL BALO DISTENSION DX STD W/PROVOCATION N/A 06/16/2022   Procedure: ESOPHAGEAL BALLOON DISTENSION STUDY, DIAGNOSTIC WITH PROVOCATION;  Surgeon: Gean Toribio Righter, MD;  Location: [facility];  Service: Gastroenterology  . PR ESOPHGL BALO DISTENSION DX STD W/PROVOCATION  03/27/2023   Procedure: ESOPHAGEAL BALLOON DISTENSION STUDY, DIAGNOSTIC WITH PROVOCATION;  Surgeon: Gean Toribio Righter, MD;  Location: [facility];  Service: Gastroenterology  . PR ESOPHGL BALO DISTENSION DX STD W/PROVOCATION N/A 08/03/2023   Procedure: ESOPHAGEAL BALLOON DISTENSION STUDY, DIAGNOSTIC WITH PROVOCATION;  Surgeon: Junette Fonda CROME, MD;  Location: [facility];  Service: Gastroenterology  . PR GERD TST W/ NASAL IMPEDENCE ELECTROD N/A 04/25/2022   Procedure: ESOPH FUNCT TST NASL ELEC PLCMT;  Surgeon: Nurse-Based Giproc;  Location: GI [facility];  Service: Gastroenterology  . PR UP GI ENDOSCOPY,BALL DIL,30MM N/A 03/27/2023   Procedure: UGI ENDO; W/BALLOON DILAT ESOPHAGUS (<30MM DIAM);  Surgeon: Gean Toribio Righter, MD;  Location: [facility];  Service: Gastroenterology  . RECTAL BOTOX  INJECTION      CURRENT MEDICATIONS: Current Outpatient Medications  Medication Sig Dispense Refill  . acetaminophen  (TYLENOL ) 500 MG tablet Take 1 tablet (500 mg total) by mouth Three (3) times a day.    . amlodipine  (NORVASC ) 10 MG tablet Take 1 tablet (10 mg total) by mouth daily.    . aspirin  (ECOTRIN) 81 MG tablet Take 1 tablet (81 mg total) by mouth daily.    . atorvastatin  (LIPITOR ) 80 MG tablet Take 1 tablet (80 mg total) by mouth daily.    .  baclofen  (LIORESAL ) 5 mg Tab tablet Take 1 tablet (5 mg total) by mouth two (2) times a day.    . bisacodyL  (DULCOLAX) 10 mg suppository Insert 1 suppository (10 mg total) into the rectum daily as needed.    . carvedilol  (COREG ) 25 MG tablet Take 1 tablet (25 mg total) by mouth two (2) times a day.    . cloNIDine  (CATAPRES -TTS) 0.3 mg/24 hr Place 1 patch on the skin once a week.    . famotidine (PEPCID) 40 MG tablet Take 1 tablet (40 mg total) by mouth daily.    . ferrous sulfate  325 (65 FE) MG tablet Take 1 tablet (325 mg total) by mouth 3 (three) times a week. MWF    . FLUoxetine  (PROZAC ) 20 MG capsule Take 1 capsule (20 mg total) by mouth daily.    . FLUoxetine  (PROZAC ) 40 MG capsule Take 1 capsule (40 mg total) by mouth daily.    . hydroCHLOROthiazide  (HYDRODIURIL ) 25 MG tablet Take 1 tablet (25 mg total) by mouth daily.    . insulin  ASPART (NOVOLOG  FLEXPEN) 100 unit/mL (3 mL) injection pen Inject under the skin Three (3) times a day before meals.    . LANTUS  SOLOSTAR U-100 INSULIN  100 unit/mL (3 mL) injection pen 0.15 mL (15 Units total) nightly.    . lidocaine  (ASPERCREME) 4 % patch Place 1 patch on the skin daily.    SABRA  lidocaine  (XYLOCAINE ) 10 mg/mL (1 %) injection     . lidocaine , PF, (XYLOCAINE -MPF) 10 mg/mL (1 %) injection SMARTSIG:Rectally    . lisinopril  (PRINIVIL ,ZESTRIL ) 40 MG tablet Take 1 tablet (40 mg total) by mouth daily.    SABRA LYUMJEV KWIKPEN U-100 INSULIN  100 unit/mL InPn Inject as per sliding scale: if 0-69 = 0 if blood sugar <60 notify NP/MD implement hypoglycemia protocol; 70-120 = 0; 121-150 = 1 unit; 151-200 = 2 units; 201-250=3 units; 251-300=5 units; 301-350=7 units; 351-400 = 9 units Blood sugar >400 notify NP/MD    . magnesium  hydroxide 400 mg/5 mL Susp Take 30 mL by mouth daily as needed.    . melatonin 5 mg tablet Take 2 tablets (10 mg total) by mouth nightly as needed.    . metoclopramide  (REGLAN ) 5 MG tablet     . mirtazapine  (REMERON ) 30 MG tablet Take 1 tablet  (30 mg total) by mouth nightly.    . nitroglycerin  (NITROSTAT ) 0.4 MG SL tablet Place 1 tablet (0.4 mg total) under the tongue every five (5) minutes as needed. Maximum of 3 doses in 15 minutes.    . ondansetron  (ZOFRAN ) 4 MG tablet Take 1 tablet (4 mg total) by mouth every six (6) hours as needed for nausea.    . ondansetron  (ZOFRAN ) 4 mg/2 mL injection     . oxyCODONE  (ROXICODONE ) 5 mg/5 mL solution Take 5 mL (5 mg total) by mouth two (2) times a day as needed for pain.    SABRA OZEMPIC 1 mg/dose (4 mg/3 mL) PnIj injection Inject 1 mg under the skin every seven (7) days.    . pantoprazole  (PROTONIX ) 40 mg GrPS Take 1 packet (40 mg total) by mouth daily.    . polyethylene glycol (GLYCOLAX ) 17 gram/dose powder Take 17 g by mouth 3 (three) times a week. Mondays,Wednesdays, Fridays    . potassium chloride  20 mEq/15 mL solution Take 15 mL (20 mEq total) by mouth daily.    SABRA senna (SENOKOT) 8.6 mg tablet Take 1 tablet by mouth daily.    . traZODone  (DESYREL ) 50 MG tablet Take 0.5 tablets (25 mg total) by mouth nightly.    . triamcinolone (KENALOG) 0.1 % cream Apply 1 g topically daily.     No current facility-administered medications for this visit.    ALLERGIES: Allergies  Allergen Reactions  . Opioids - Morphine  Analogues Hives  . Metformin  Hcl Diarrhea    SOCIAL HISTORY: Social History   Tobacco Use  . Smoking status: Former    Current packs/day: 0.00    Types: Cigarettes    Quit date: 05/06/2022    Years since quitting: 1.3  . Smokeless tobacco: Never  Vaping Use  . Vaping status: Never Used  Substance and Sexual Activity  . Alcohol  use: Not Currently  . Drug use: Not Currently    Types: Marijuana    Comment: used 3 weeks ago   Social Drivers of Corporate Investment Banker Strain: Medium Risk (08/02/2023)   Overall Financial Resource Strain (CARDIA)   . Difficulty of Paying Living Expenses: Somewhat hard  Food Insecurity: No Food Insecurity (08/02/2023)   Hunger Vital Sign   .  Worried About Programme Researcher, Broadcasting/film/video in the Last Year: Never true   . Ran Out of Food in the Last Year: Never true  Transportation Needs: No Transportation Needs (08/02/2023)   PRAPARE - Transportation   . Lack of Transportation (Medical): No   . Lack of Transportation (Non-Medical): No  FAMILY HISTORY: No family history on file.  VITAL SIGNS: There were no vitals taken for this visit.  PHYSICAL EXAMINATION: Constitutional: Alert, oriented x 3, no acute distress, sitting in wheel chair Mental Status: Thought organized, appropriate affect, speech is dysarthric HEENT: conjunctiva clear, anicteric, Pulmonary: unlabored breathing. Abdomen: obese  LABORATORY DATA: Reviewed  IMAGING DATA: Barium swallow 01/29/23:-Abnormal study with dilated, debris-filled esophagus. Contrast did not pass from the esophagus to the stomach. Differential includes recurrence of achalasia. Recommend GI consultation for further management recommendations.   GASTROENTEROLOGY PROCEDURES: EGD 12/6: food impaction LES DI 3.0-4.2, food removal, no further LES intervention. Pneumatic dilation to 35 mm 03/27/23 Pneumatic dilation to 30 mm 06/16/22  ASSESSMENT: Diamond Rice is a 53 y.o. 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 She had a food impaction last month after eating too quickly and not chewing thoroughly enough during thanksgiving dinner. LES distensibility was normal on FLIP during EGD suggesting adequate LES disruption.  She has done well without dysphagia or regurgitation since food removal last month.   PLAN: Continue pantoprazole  40 mg once daily in the morning. Ok to resume regular diet but she needs to be very cautious with how she eats and must continue to sit upright to eat, take  small bites, chew thoroughly, take frequent sips of liquid with solid food and allow 2-3 hours after eating before lying down Follow up in 6 months, sooner as needed.   Please don't hesitate to contact me with any questions or concerns.  Toribio Grumbling, MD Assistant Professor [facility] Gastroenterology

## 2023-09-11 NOTE — Progress Notes (Signed)
 Follow Up Visit   [name], female   Patient DOB: 1971-04-30 Date of Service: 09/11/2023  Patient MRN: [id number] Provider Creating Note: Bonnell Sherry, MD  [phone] Primary Care Physician:   [address] [facility] [place] [address] Additional Physicians/ Providers:    History of Present Illness Diamond Rice is a 53 y.o. female who is following up today for refractory hypertension, chronic kidney disease stage II, secondary hyperparathyroidism, and anemia of chronic kidney disease.  The patient's chronic kidney disease has actually improved with most recent EGFR up to 73.  She has history of refractory hypertension however blood pressure under reasonable control at 142/72.  Patient also has secondary hyperparathyroidism with most recent PTH of 96, phosphorus 4.1, calcium  9.0.  She also has anemia of chronic kidney disease most recent globin of 10.3.  Medications   Current Outpatient Medications:  .  amLODIPine  (NORVASC ) 10 MG tablet, at bed time, Disp: , Rfl:  .  aspirin  (ST JOSEPH) 81 MG EC tablet, at bed time, Disp: , Rfl:  .  atorvastatin  (LIPITOR ) 40 MG tablet, Take 80 mg by mouth, Disp: , Rfl:  .  Baclofen  5 MG tablet, Take 5 mg by mouth in the morning and 5 mg in the evening., Disp: , Rfl:  .  bisacodyl  (DULCOLAX) 10 MG suppository, Insert 10 mg into the rectum, Disp: , Rfl:  .  carvedilol  (COREG ) 25 MG tablet, Take 12.5 mg by mouth, Disp: , Rfl:  .  cloNIDine  (CATAPRES ) 0.2 MG tablet, Take 0.2 mg by mouth, Disp: , Rfl:  .  famotidine (PEPCID) 40 MG tablet, Take 40 mg by mouth in the morning., Disp: , Rfl:  .  Ferrous Sulfate  220 (44 Fe) MG/5ML solution, Take by mouth, Disp: , Rfl:  .  FLUoxetine  (PROzac ) 20 MG capsule, Take 20 mg by mouth in the morning., Disp: , Rfl:  .  FLUoxetine  (PROzac ) 40 MG capsule, Take 40 mg by mouth in the morning., Disp: , Rfl:  .  hydroCHLOROthiazide  25 MG tablet, , Disp: , Rfl:  .  insulin  glargine (LANTUS ) 100 UNIT/ML injection,  Inject 10 Units under the skin, Disp: , Rfl:  .  Insulin  Lispro, 1 Unit Dial, (HumaLOG KWIKPEN) 100 UNIT/ML solution pen-injector, , Disp: , Rfl:  .  lidocaine  (XYLOCAINE ) 1 % injection, , Disp: , Rfl:  .  lisinopril  40 MG tablet, at bed time, Disp: , Rfl:  .  Lyumjev KwikPen 100 UNIT/ML solution pen-injector, , Disp: , Rfl:  .  Melatonin 5 MG capsule, Take by mouth, Disp: , Rfl:  .  mirtazapine  (REMERON ) 30 MG tablet, Take 30 mg by mouth, Disp: , Rfl:  .  nitroglycerin  (NITROSTAT ) 0.4 MG SL tablet, Place 0.4 mg under the tongue, Disp: , Rfl:  .  oxyCODONE  (ROXICODONE ) 10 MG immediate release tablet, , Disp: , Rfl:  .  Ozempic, 0.25 or 0.5 MG/DOSE, 2 MG/3ML solution pen-injector, , Disp: , Rfl:  .  potassium chloride  (KLOR-CON  M20) 20 MEQ CR tablet, Take 20 mEq by mouth in the morning., Disp: , Rfl:  .  senna (SENOKOT) 8.6 MG tablet, Take 1 tablet by mouth 1 (one) time each day, Disp: , Rfl:  .  traZODone  (DESYREL ) 50 MG tablet, , Disp: , Rfl:    Allergies Morphine  and codeine and Metformin  hcl  Problem List Patient Active Problem List  Diagnosis  . Acute kidney failure (HCC)  . Essential hypertension  . Diabetes mellitus without mention of complication, type II or  unspecified type, not stated as uncontrolled (HCC)     Review of Systems  Constitutional:  Negative for chills and fever.  Respiratory:  Negative for cough and shortness of breath.   Cardiovascular:  Negative for chest pain and palpitations.  Gastrointestinal:  Negative for nausea and vomiting.  Genitourinary:  Negative for dysuria, hematuria and urgency.     History Past Medical History:  Diagnosis Date  . Acute kidney failure (HCC)   . Anemia   . Cerebrovascular accident (HCC)   . Chronic airway obstruction, not elsewhere classified (HCC)   . Diabetes mellitus without mention of complication, type II or unspecified type, not stated as uncontrolled (HCC)   . Dyspnea   . Essential hypertension   . Left  ventricular hypertrophy   . Obstructive sleep apnea   . Other and unspecified hyperlipidemia   . Polysubstance abuse (HCC)    hx of cocaine use  . Pulmonary embolism (HCC)   . Sleep apnea   . Urinary incontinence   . Urinary tract infection     Past Surgical History:  Procedure Laterality Date  . CARDIAC CATHETERIZATION    . FOREIGN BODY REMOVAL     Family History  Problem Relation Age of Onset  . Heart disease Mother   . Stroke Father   . Stroke Brother    Social History   Tobacco Use  . Smoking status: Not on file  . Smokeless tobacco: Not on file  Substance Use Topics  . Alcohol  use: Not on file        Physical Exam  Vitals BP 142/72 (BP Location: Right upper arm)   Pulse 78   Temp 98 F   Wt 246 lb (112 kg)   SpO2 93%   PHYSICAL EXAM: General appearance: Sitting up in wheelchair NAD Eyes: anicteric sclerae, moist conjunctivae; no lid-lag  HENT: Atraumatic; hearing intact Neck: Trachea midline; supple Lungs: CTAB, with normal respiratory effort  CV: S1S2, no murmurs or rubs. Abdomen: Soft, non-tender; bowel sounds present Extremities: Trace bilateral lower extremity edema Skin: Warm and dry, normal skin turgor, no rashes noted. Psych: Appropriate affect, alert and oriented to person, place and time    Laboratory Studies   1012/21/22: Aldosterone to renin ratio 5.8, TSH 0.8, normal plasma metanephrines, cortisol 0.5   10/29/2022: eGFR 54   Imaging and Other Studies     Orders Placed This Encounter  . Renal Function Panel  . CBC and Differential  . PTH, Intact       Impression/Recommendations  Diamond Rice is a 53 y.o. female with past medical history of CVA with residual left-sided weakness with dysarthria, hypertension, diabetes mellitus type 2, obstructive sleep apnea, history of polysubstance abuse, hyperlipidemia, urinary incontinence, history of obesity who was referred the evaluation of refractory hypertension.    1.  Refractory  hypertension.  The patient's hypertension is under reasonable control at 142/72.  Will maintain the patient on amlodipine , carvedilol , clonidine , HCTZ, lisinopril  for hypertension control.  2.  Chronic kidney disease stage II.  Renal function dramatically improved.  eGFR up to 73.  Maintain the patient on lisinopril .  Follow-up renal parameters today.  3.  Secondary hyperparathyroidism.  Most recent PTH was 96, phosphorus 4.1, calcium  9.0.  Follow-up bone metabolism parameters today.  No immediate need for calcitriol.  4.  Anemia of chronic kidney disease.  Most recent hemoglobin was 10.3.  Follow-up CBC today.  No immediate need for Epogen.   Return in about 6 months (around 03/10/2024).  Munsoor Lateef, MD

## 2023-09-16 NOTE — Progress Notes (Deleted)
Cardiology Office Note:   Date:  09/16/2023  ID:  Diamond Rice, DOB 1971-04-21, MRN [id number] PCP: Almetta Lovely, [facility] Providers Cardiologist:  Rollene Rotunda, MD {  History of Present Illness:   Diamond Rice is a 53 y.o. female who presents for evaluation of LVH.  The patient was in the hospital with pulmonary embolism following toe amputation.  Her EF is found to be 65 to 70%.  She is noted to have mild to moderate aortic insufficiency.  There was a mention in her notes of having a previous MI but I do not see a record of this.  I do see that she had a previous perfusion study in 2013 without ischemia or infarct.   She reports that there was a heart catheterization some years ago when I see mention in 08/2009.  She said there was some small vessel disease.  Other than that there is not been any significant cardiac history.  She is had difficult to control hypertension with a strong family history of this.  She had a stroke with left-sided weakness.    I sent her for an echo that suggested moderate AI with concentric LVH.  ***    This suggested moderate AI with moderate concentric LVH.  She comes today for follow-up.  She gets around in a wheelchair.  She [facility] at [facility].  Because of left hemiparesis and her previous stroke she can only stand and pivot.  She can propel herself forward with her arm and leg from the right.  With this level of activity she denies any chest pressure, neck or arm discomfort.  She has no new shortness of breath, PND or orthopnea.  She has no palpitations, presyncope or syncope.  She sleeps in a hospital bed with her head slightly elevated but this has been chronic.  She does use CPAP.  ROS: ***  Studies Reviewed:    EKG:       ***  Risk Assessment/Calculations:   {Does this patient have ATRIAL FIBRILLATION?[phone]} No BP recorded.  {Refresh Note OR Click here to enter BP  :1}***        Physical  Exam:   VS:  LMP 04/26/2018 (Approximate)    Wt Readings from Last 3 Encounters:  07/02/23 249 lb (112.9 kg)  03/16/23 249 lb 1.9 oz (113 kg)  03/13/23 251 lb (113.9 kg)     GEN: Well nourished, well developed in no acute distress NECK: No JVD; No carotid bruits CARDIAC: ***RR, *** murmurs, rubs, gallops RESPIRATORY:  Clear to auscultation without rales, wheezing or rhonchi  ABDOMEN: Soft, non-tender, non-distended EXTREMITIES:  No edema; No deformity   ASSESSMENT AND PLAN:   Chest pain and abnormal EKG:   ***   The patient is having no further chest pain.  No further work-up.   AI:   Her AI was moderate ***  appears to be moderate.  Any surgical intervention for this would be high risk given her previous stroke.  This seems to be something we can follow and I will follow-up with an echocardiogram in 1 year.  She is currently asymptomatic and has normal size and LV function.    LVH:   ***  This is likely related to hypertension and her blood pressure is currently well controlled.  This will be followed up with echo as above.   Hypertension:   BP is ***     s well controlled on the current meds.  No change in therapy.    Sleep apnea:    She is using CPAP.  ***      DM:   I do not see a recent ***  A1c and will defer to her primary providers.   Risk reduction: LDL was ***  n May of last year was 67 with an HDL of 72.  No change in therapy.  She should have follow-up lipids at her primary provider this year and I be happy to review these.       Follow up ***  Signed, Rollene Rotunda, MD

## 2023-09-19 ENCOUNTER — Encounter: Payer: Self-pay | Admitting: *Deleted

## 2023-09-19 ENCOUNTER — Ambulatory Visit: Payer: 59 | Admitting: Cardiology

## 2023-09-19 DIAGNOSIS — R072 Precordial pain: Secondary | ICD-10-CM

## 2023-09-19 DIAGNOSIS — I517 Cardiomegaly: Secondary | ICD-10-CM

## 2023-09-19 DIAGNOSIS — I351 Nonrheumatic aortic (valve) insufficiency: Secondary | ICD-10-CM

## 2023-09-19 DIAGNOSIS — I10 Essential (primary) hypertension: Secondary | ICD-10-CM

## 2023-09-19 DIAGNOSIS — E118 Type 2 diabetes mellitus with unspecified complications: Secondary | ICD-10-CM

## 2023-09-19 NOTE — Telephone Encounter (Signed)
Spoke with patient's nurse, Marlana Salvage, at [facility] to notify of inclement weather delay. She is aware that a scheduler will contact them later to day to reschedule.

## 2023-09-19 NOTE — Progress Notes (Unsigned)
  Cardiology Office Note:   Date:  09/20/2023  ID:  Amilian Jovic, DOB 1970-12-24, MRN [id number] PCP: Almetta Lovely, [facility] Providers Cardiologist:  Rollene Rotunda, MD {  History of Present Illness:   Diamond Rice is a 53 y.o. female has a history of aortic valve disease.  She had a history of pulmonary embolism following amputation of the toe.  She has had distant heart catheterization 2011 with small vessel disease.  She has had difficult to control hypertension and has had a stroke with left-sided weakness.  Echocardiogram in April 2024 demonstrated moderate aortic insufficiency with normal left ventricular size and function.  There is moderate left ventricular hypertrophy as well.  She lives at an assisted living facility but hopes to move out.  This is in [place].  She gets around in a wheelchair and helps herself in and out of the bed and into the toilet.  The patient denies any new symptoms such as chest discomfort, neck or arm discomfort. There has been no new shortness of breath, PND or orthopnea. There have been no reported palpitations, presyncope or syncope.   ROS: As stated in the HPI and negative for all other systems.  Studies Reviewed:    EKG:   NA    Risk Assessment/Calculations:              Physical Exam:   VS:  BP 124/80   Pulse 85   Ht 5\' 1"  (1.549 m)   Wt 249 lb (112.9 kg)   LMP 04/26/2018 (Approximate)   SpO2 97%   BMI 47.05 kg/m    Wt Readings from Last 3 Encounters:  09/20/23 249 lb (112.9 kg)  07/02/23 249 lb (112.9 kg)  03/16/23 249 lb 1.9 oz (113 kg)     GEN: Well nourished, well developed in no acute distress NECK: No JVD; No carotid bruits CARDIAC: RRR, no murmurs, rubs, gallops RESPIRATORY:  Clear to auscultation without rales, wheezing or rhonchi  ABDOMEN: Soft, non-tender, non-distended EXTREMITIES:  No edema; No deformity   ASSESSMENT AND PLAN:      AI:   Her AI appears to be moderate.   I  will follow-up with an echocardiogram in April.    LVH: This was moderate and likely related to difficult to control hypertension.  This will be assessed again at the time of her echo.  Hypertension:   The blood pressure is well-controlled.  She should get this checked at least once a week at her facility.  Sleep apnea:     We are going to get her referred to a new sleep doctor as she currently does not have CPAP.  She said they took away the facility.   DM:   A1c was 7.4.  No change in therapy.  This should be managed by her primary providers.   Risk reduction: LDL has not been tested as far as I can see in the last year.  Previously was 60.  Have given written instructions for her to get a lipid profile.    Follow up with me in one year.   Signed, Rollene Rotunda, MD

## 2023-09-20 ENCOUNTER — Ambulatory Visit: Payer: 59 | Attending: Cardiology | Admitting: Cardiology

## 2023-09-20 ENCOUNTER — Encounter: Payer: Self-pay | Admitting: Cardiology

## 2023-09-20 VITALS — BP 124/80 | HR 85 | Ht 61.0 in | Wt 249.0 lb

## 2023-09-20 DIAGNOSIS — I10 Essential (primary) hypertension: Secondary | ICD-10-CM

## 2023-09-20 DIAGNOSIS — E785 Hyperlipidemia, unspecified: Secondary | ICD-10-CM

## 2023-09-20 DIAGNOSIS — I351 Nonrheumatic aortic (valve) insufficiency: Secondary | ICD-10-CM | POA: Diagnosis not present

## 2023-09-20 DIAGNOSIS — G4730 Sleep apnea, unspecified: Secondary | ICD-10-CM | POA: Diagnosis not present

## 2023-09-20 NOTE — Addendum Note (Signed)
Addended by: Jeannette How A on: 09/20/2023 12:22 PM   Modules accepted: Orders

## 2023-09-20 NOTE — Patient Instructions (Signed)
Medication Instructions:  No changes.  *If you need a refill on your cardiac medications before your next appointment, please call your pharmacy*    Testing/Procedures: Your physician has requested that you have an echocardiogram. Echocardiography is a painless test that uses sound waves to create images of your heart. It provides your doctor with information about the size and shape of your heart and how well your heart's chambers and valves are working. This procedure takes approximately one hour. There are no restrictions for this procedure. [address]  Please do NOT wear cologne, perfume, aftershave, or lotions (deodorant is allowed). Please arrive 15 minutes prior to your appointment time.  Please note: We ask at that you not bring children with you during ultrasound (echo/ vascular) testing. Due to room size and safety concerns, children are not allowed in the ultrasound rooms during exams. Our front office staff cannot provide observation of children in our lobby area while testing is being conducted. An adult accompanying a patient to their appointment will only be allowed in the ultrasound room at the discretion of the ultrasound technician under special circumstances. We apologize for any inconvenience.    Follow-Up: At [facility], you and your health needs are our priority.  As part of our continuing mission to provide you with exceptional heart care, we have created designated Provider Care Teams.  These Care Teams include your primary Cardiologist (physician) and Advanced Practice Providers (APPs -  Physician Assistants and Nurse Practitioners) who all work together to provide you with the care you need, when you need it.  We recommend signing up for the patient portal called "MyChart".  Sign up information is provided on this After Visit Summary.  MyChart is used to connect with patients for Virtual Visits (Telemedicine).  Patients are able to view lab/test results,  encounter notes, upcoming appointments, etc.  Non-urgent messages can be sent to your provider as well.   To learn more about what you can do with MyChart, go to [email or web address].    Your next appointment:   1 year(s)  Provider:   Rollene Rotunda, MD

## 2023-12-10 ENCOUNTER — Ambulatory Visit (HOSPITAL_COMMUNITY): Payer: 59 | Attending: Cardiology

## 2023-12-10 DIAGNOSIS — I10 Essential (primary) hypertension: Secondary | ICD-10-CM | POA: Diagnosis not present

## 2023-12-10 LAB — ECHOCARDIOGRAM COMPLETE
Area-P 1/2: 5.58 cm2
P 1/2 time: 678 ms
S' Lateral: 2.6 cm

## 2024-01-15 ENCOUNTER — Encounter (INDEPENDENT_AMBULATORY_CARE_PROVIDER_SITE_OTHER): Payer: Self-pay

## 2024-03-04 ENCOUNTER — Encounter: Payer: Self-pay | Admitting: Emergency Medicine

## 2024-03-04 DIAGNOSIS — F17210 Nicotine dependence, cigarettes, uncomplicated: Secondary | ICD-10-CM | POA: Diagnosis not present

## 2024-03-04 DIAGNOSIS — E876 Hypokalemia: Secondary | ICD-10-CM | POA: Diagnosis not present

## 2024-03-04 DIAGNOSIS — Z79899 Other long term (current) drug therapy: Secondary | ICD-10-CM | POA: Diagnosis not present

## 2024-03-04 DIAGNOSIS — Z7982 Long term (current) use of aspirin: Secondary | ICD-10-CM | POA: Diagnosis not present

## 2024-03-04 DIAGNOSIS — Z794 Long term (current) use of insulin: Secondary | ICD-10-CM | POA: Diagnosis not present

## 2024-03-04 DIAGNOSIS — K222 Esophageal obstruction: Secondary | ICD-10-CM | POA: Diagnosis not present

## 2024-03-04 DIAGNOSIS — W44F9XA Other object of natural or organic material, entering into or through a natural orifice, initial encounter: Secondary | ICD-10-CM | POA: Insufficient documentation

## 2024-03-04 DIAGNOSIS — T18108A Unspecified foreign body in esophagus causing other injury, initial encounter: Secondary | ICD-10-CM | POA: Diagnosis present

## 2024-03-04 DIAGNOSIS — K2980 Duodenitis without bleeding: Secondary | ICD-10-CM | POA: Diagnosis not present

## 2024-03-04 DIAGNOSIS — E1165 Type 2 diabetes mellitus with hyperglycemia: Secondary | ICD-10-CM | POA: Insufficient documentation

## 2024-03-04 DIAGNOSIS — Z7985 Long-term (current) use of injectable non-insulin antidiabetic drugs: Secondary | ICD-10-CM | POA: Insufficient documentation

## 2024-03-04 DIAGNOSIS — B9620 Unspecified Escherichia coli [E. coli] as the cause of diseases classified elsewhere: Secondary | ICD-10-CM | POA: Insufficient documentation

## 2024-03-04 DIAGNOSIS — N390 Urinary tract infection, site not specified: Secondary | ICD-10-CM | POA: Diagnosis not present

## 2024-03-04 DIAGNOSIS — R112 Nausea with vomiting, unspecified: Secondary | ICD-10-CM | POA: Insufficient documentation

## 2024-03-04 DIAGNOSIS — I10 Essential (primary) hypertension: Secondary | ICD-10-CM | POA: Insufficient documentation

## 2024-03-04 DIAGNOSIS — Z8673 Personal history of transient ischemic attack (TIA), and cerebral infarction without residual deficits: Secondary | ICD-10-CM | POA: Diagnosis not present

## 2024-03-04 DIAGNOSIS — J449 Chronic obstructive pulmonary disease, unspecified: Secondary | ICD-10-CM | POA: Diagnosis not present

## 2024-03-04 NOTE — ED Triage Notes (Signed)
 Pt arrived via [facility] from [facility] with c/o Esophageal difficulty with hx/o same requiring stretching. Pt reports emesis after attempting eating since Saturday.

## 2024-03-04 NOTE — ED Triage Notes (Signed)
 FIRST NURSE NOTE:  Pt arrived via [facility] from [facility], c/o choking, see specialist at [facility], pt breathing freely no distress,  VSS with EMS.

## 2024-03-05 ENCOUNTER — Observation Stay: Admitting: Anesthesiology

## 2024-03-05 ENCOUNTER — Encounter
Admission: EM | Disposition: A | Discharge status: Other Acute Inpatient Hospital | Payer: Self-pay | Source: Home / Self Care | Attending: Emergency Medicine

## 2024-03-05 ENCOUNTER — Observation Stay: Admission: EM | Admit: 2024-03-05 | Discharge: 2024-03-06 | Attending: Gastroenterology | Admitting: Gastroenterology

## 2024-03-05 DIAGNOSIS — T18128A Food in esophagus causing other injury, initial encounter: Secondary | ICD-10-CM | POA: Diagnosis not present

## 2024-03-05 DIAGNOSIS — N3000 Acute cystitis without hematuria: Secondary | ICD-10-CM

## 2024-03-05 DIAGNOSIS — E876 Hypokalemia: Secondary | ICD-10-CM | POA: Diagnosis present

## 2024-03-05 DIAGNOSIS — J449 Chronic obstructive pulmonary disease, unspecified: Secondary | ICD-10-CM | POA: Diagnosis present

## 2024-03-05 DIAGNOSIS — K220 Achalasia of cardia: Secondary | ICD-10-CM

## 2024-03-05 DIAGNOSIS — Z8719 Personal history of other diseases of the digestive system: Secondary | ICD-10-CM

## 2024-03-05 DIAGNOSIS — K2980 Duodenitis without bleeding: Secondary | ICD-10-CM | POA: Diagnosis not present

## 2024-03-05 DIAGNOSIS — T18108A Unspecified foreign body in esophagus causing other injury, initial encounter: Secondary | ICD-10-CM | POA: Diagnosis not present

## 2024-03-05 DIAGNOSIS — R112 Nausea with vomiting, unspecified: Principal | ICD-10-CM

## 2024-03-05 DIAGNOSIS — E1151 Type 2 diabetes mellitus with diabetic peripheral angiopathy without gangrene: Secondary | ICD-10-CM | POA: Diagnosis present

## 2024-03-05 DIAGNOSIS — R1310 Dysphagia, unspecified: Secondary | ICD-10-CM

## 2024-03-05 DIAGNOSIS — I6930 Unspecified sequelae of cerebral infarction: Secondary | ICD-10-CM

## 2024-03-05 DIAGNOSIS — N390 Urinary tract infection, site not specified: Secondary | ICD-10-CM | POA: Diagnosis present

## 2024-03-05 DIAGNOSIS — W44F3XA Food entering into or through a natural orifice, initial encounter: Secondary | ICD-10-CM

## 2024-03-05 DIAGNOSIS — I10 Essential (primary) hypertension: Secondary | ICD-10-CM | POA: Diagnosis present

## 2024-03-05 HISTORY — PX: ESOPHAGOGASTRODUODENOSCOPY: SHX5428

## 2024-03-05 LAB — CBG MONITORING, ED
Glucose-Capillary: 110 mg/dL — ABNORMAL HIGH (ref 70–99)
Glucose-Capillary: 135 mg/dL — ABNORMAL HIGH (ref 70–99)

## 2024-03-05 LAB — CBC WITH DIFFERENTIAL/PLATELET
Abs Immature Granulocytes: 0.13 K/uL — ABNORMAL HIGH (ref 0.00–0.07)
Basophils Absolute: 0.1 K/uL (ref 0.0–0.1)
Basophils Relative: 0 %
Eosinophils Absolute: 0.1 K/uL (ref 0.0–0.5)
Eosinophils Relative: 1 %
HCT: 37.8 % (ref 36.0–46.0)
Hemoglobin: 11.7 g/dL — ABNORMAL LOW (ref 12.0–15.0)
Immature Granulocytes: 1 %
Lymphocytes Relative: 31 %
Lymphs Abs: 3.5 K/uL (ref 0.7–4.0)
MCH: 23.5 pg — ABNORMAL LOW (ref 26.0–34.0)
MCHC: 31 g/dL (ref 30.0–36.0)
MCV: 75.9 fL — ABNORMAL LOW (ref 80.0–100.0)
Monocytes Absolute: 0.9 K/uL (ref 0.1–1.0)
Monocytes Relative: 8 %
Neutro Abs: 6.8 K/uL (ref 1.7–7.7)
Neutrophils Relative %: 59 %
Platelets: 342 K/uL (ref 150–400)
RBC: 4.98 MIL/uL (ref 3.87–5.11)
RDW: 16.8 % — ABNORMAL HIGH (ref 11.5–15.5)
WBC: 11.5 K/uL — ABNORMAL HIGH (ref 4.0–10.5)
nRBC: 0.2 % (ref 0.0–0.2)

## 2024-03-05 LAB — URINALYSIS, ROUTINE W REFLEX MICROSCOPIC
Bilirubin Urine: NEGATIVE
Glucose, UA: NEGATIVE mg/dL
Hgb urine dipstick: NEGATIVE
Ketones, ur: 20 mg/dL — AB
Nitrite: NEGATIVE
Protein, ur: 100 mg/dL — AB
Specific Gravity, Urine: 1.018 (ref 1.005–1.030)
WBC, UA: >50 WBC/hpf (ref 0–5)
pH: 6 (ref 5.0–8.0)

## 2024-03-05 LAB — BASIC METABOLIC PANEL WITH GFR
Anion gap: 11 (ref 5–15)
BUN: 17 mg/dL (ref 6–20)
CO2: 27 mmol/L (ref 22–32)
Calcium: 8.6 mg/dL — ABNORMAL LOW (ref 8.9–10.3)
Chloride: 101 mmol/L (ref 98–111)
Creatinine, Ser: 0.96 mg/dL (ref 0.44–1.00)
GFR, Estimated: >60 mL/min (ref >60)
Glucose, Bld: 130 mg/dL — ABNORMAL HIGH (ref 70–99)
Potassium: 3.2 mmol/L — ABNORMAL LOW (ref 3.5–5.1)
Sodium: 139 mmol/L (ref 135–145)

## 2024-03-05 LAB — COMPREHENSIVE METABOLIC PANEL WITH GFR
ALT: 15 U/L (ref 0–44)
AST: 17 U/L (ref 15–41)
Albumin: 3 g/dL — ABNORMAL LOW (ref 3.5–5.0)
Alkaline Phosphatase: 94 U/L (ref 38–126)
Anion gap: 14 (ref 5–15)
BUN: 16 mg/dL (ref 6–20)
CO2: 30 mmol/L (ref 22–32)
Calcium: 9.2 mg/dL (ref 8.9–10.3)
Chloride: 95 mmol/L — ABNORMAL LOW (ref 98–111)
Creatinine, Ser: 0.98 mg/dL (ref 0.44–1.00)
GFR, Estimated: >60 mL/min (ref >60)
Glucose, Bld: 142 mg/dL — ABNORMAL HIGH (ref 70–99)
Potassium: 2.9 mmol/L — ABNORMAL LOW (ref 3.5–5.1)
Sodium: 139 mmol/L (ref 135–145)
Total Bilirubin: 0.6 mg/dL (ref 0.0–1.2)
Total Protein: 7.6 g/dL (ref 6.5–8.1)

## 2024-03-05 LAB — GLUCOSE, CAPILLARY
Glucose-Capillary: 196 mg/dL — ABNORMAL HIGH (ref 70–99)
Glucose-Capillary: 374 mg/dL — ABNORMAL HIGH (ref 70–99)

## 2024-03-05 LAB — HIV ANTIBODY (ROUTINE TESTING W REFLEX): HIV Screen 4th Generation wRfx: NONREACTIVE

## 2024-03-05 LAB — HEMOGLOBIN A1C
Hgb A1c MFr Bld: 8.6 % — ABNORMAL HIGH (ref 4.8–5.6)
Mean Plasma Glucose: 200 mg/dL

## 2024-03-05 LAB — MAGNESIUM
Magnesium: 2.1 mg/dL (ref 1.7–2.4)
Magnesium: 2 mg/dL (ref 1.7–2.4)

## 2024-03-05 LAB — PHOSPHORUS: Phosphorus: 2.4 mg/dL — ABNORMAL LOW (ref 2.5–4.6)

## 2024-03-05 SURGERY — EGD (ESOPHAGOGASTRODUODENOSCOPY)
Anesthesia: General

## 2024-03-05 MED ORDER — ACETAMINOPHEN 650 MG RE SUPP: 650.0000 mg | Freq: Four times a day (QID) | RECTAL | Status: DC | PRN

## 2024-03-05 MED ORDER — POTASSIUM CHLORIDE IN NACL 40-0.9 MEQ/L-% IV SOLN
INTRAVENOUS | Status: AC
  Administered 2024-03-05: 20 mL/h via INTRAVENOUS
  Filled 2024-03-05: qty 1000

## 2024-03-05 MED ORDER — ONDANSETRON HCL 4 MG PO TABS: 4.0000 mg | ORAL_TABLET | Freq: Four times a day (QID) | ORAL | Status: DC | PRN

## 2024-03-05 MED ORDER — LACTATED RINGERS IV SOLN
INTRAVENOUS | Status: DC | PRN
Start: 2024-03-05 — End: 2024-03-05

## 2024-03-05 MED ORDER — MORPHINE SULFATE (PF) 2 MG/ML IV SOLN: 2.0000 mg | INTRAVENOUS | Status: DC | PRN

## 2024-03-05 MED ORDER — PROPOFOL 10 MG/ML IV BOLUS
INTRAVENOUS | Status: DC | PRN
  Administered 2024-03-05: 150 mg via INTRAVENOUS

## 2024-03-05 MED ORDER — INSULIN ASPART 100 UNIT/ML IJ SOLN
0.0000 [IU] | INTRAMUSCULAR | Status: DC
  Administered 2024-03-05: 3 [IU] via SUBCUTANEOUS
  Administered 2024-03-05: 2 [IU] via SUBCUTANEOUS
  Filled 2024-03-05: qty 1

## 2024-03-05 MED ORDER — PROPOFOL 10 MG/ML IV BOLUS
INTRAVENOUS | Status: AC
  Filled 2024-03-05: qty 20

## 2024-03-05 MED ORDER — ROCURONIUM BROMIDE 10 MG/ML (PF) SYRINGE
PREFILLED_SYRINGE | INTRAVENOUS | Status: AC
  Filled 2024-03-05: qty 10

## 2024-03-05 MED ORDER — DEXAMETHASONE SODIUM PHOSPHATE 10 MG/ML IJ SOLN
INTRAMUSCULAR | Status: DC | PRN
  Administered 2024-03-05: 10 mg via INTRAVENOUS

## 2024-03-05 MED ORDER — INSULIN ASPART 100 UNIT/ML IJ SOLN: 0.0000 [IU] | Freq: Three times a day (TID) | INTRAMUSCULAR | Status: DC

## 2024-03-05 MED ORDER — ONDANSETRON HCL 4 MG/2ML IJ SOLN
INTRAMUSCULAR | Status: AC
  Filled 2024-03-05: qty 2

## 2024-03-05 MED ORDER — POTASSIUM CHLORIDE 10 MEQ/100ML IV SOLN
10.0000 meq | INTRAVENOUS | Status: AC
  Administered 2024-03-05 (×2): 10 meq via INTRAVENOUS
  Filled 2024-03-05 (×2): qty 100

## 2024-03-05 MED ORDER — LIDOCAINE HCL (CARDIAC) PF 100 MG/5ML IV SOSY
PREFILLED_SYRINGE | INTRAVENOUS | Status: DC | PRN
  Administered 2024-03-05: 100 mg via INTRAVENOUS

## 2024-03-05 MED ORDER — DEXAMETHASONE SODIUM PHOSPHATE 10 MG/ML IJ SOLN
INTRAMUSCULAR | Status: AC
  Filled 2024-03-05: qty 1

## 2024-03-05 MED ORDER — SODIUM CHLORIDE 0.9 % IV SOLN
1.0000 g | INTRAVENOUS | Status: DC
  Administered 2024-03-05 – 2024-03-06 (×2): 1 g via INTRAVENOUS
  Filled 2024-03-05 (×2): qty 10

## 2024-03-05 MED ORDER — ONDANSETRON HCL 4 MG/2ML IJ SOLN
INTRAMUSCULAR | Status: AC
Start: 2024-03-05 — End: 2024-03-05
  Filled 2024-03-05: qty 2

## 2024-03-05 MED ORDER — POTASSIUM CHLORIDE 20 MEQ PO PACK
40.0000 meq | PACK | Freq: Once | ORAL | Status: AC
  Administered 2024-03-05: 40 meq via ORAL
  Filled 2024-03-05: qty 2

## 2024-03-05 MED ORDER — HYDRALAZINE HCL 20 MG/ML IJ SOLN
5.0000 mg | INTRAMUSCULAR | Status: DC | PRN
Start: 2024-03-05 — End: 2024-03-07

## 2024-03-05 MED ORDER — ONDANSETRON HCL 4 MG/2ML IJ SOLN
INTRAMUSCULAR | Status: DC | PRN
Start: 2024-03-05 — End: 2024-03-05
  Administered 2024-03-05: 4 mg via INTRAVENOUS

## 2024-03-05 MED ORDER — ACETAMINOPHEN 325 MG PO TABS
650.0000 mg | ORAL_TABLET | Freq: Four times a day (QID) | ORAL | Status: DC | PRN
Start: 2024-03-05 — End: 2024-03-07
  Administered 2024-03-05: 650 mg via ORAL
  Filled 2024-03-05: qty 2

## 2024-03-05 MED ORDER — INSULIN ASPART 100 UNIT/ML IJ SOLN
0.0000 [IU] | Freq: Three times a day (TID) | INTRAMUSCULAR | Status: DC
  Administered 2024-03-05: 9 [IU] via SUBCUTANEOUS
  Administered 2024-03-06: 2 [IU] via SUBCUTANEOUS
  Administered 2024-03-06: 3 [IU] via SUBCUTANEOUS
  Filled 2024-03-05 (×2): qty 1

## 2024-03-05 MED ORDER — POTASSIUM & SODIUM PHOSPHATES 280-160-250 MG PO PACK
2.0000 | PACK | Freq: Once | ORAL | Status: AC
  Administered 2024-03-05: 2 via ORAL
  Filled 2024-03-05: qty 2

## 2024-03-05 MED ORDER — ONDANSETRON HCL 4 MG/2ML IJ SOLN: 4.0000 mg | Freq: Four times a day (QID) | INTRAMUSCULAR | Status: DC | PRN

## 2024-03-05 MED ORDER — LIDOCAINE HCL (PF) 2 % IJ SOLN
INTRAMUSCULAR | Status: AC
  Filled 2024-03-05: qty 5

## 2024-03-05 MED ORDER — SUCCINYLCHOLINE CHLORIDE 200 MG/10ML IV SOSY
PREFILLED_SYRINGE | INTRAVENOUS | Status: AC
  Filled 2024-03-05: qty 10

## 2024-03-05 MED ORDER — SUCCINYLCHOLINE CHLORIDE 200 MG/10ML IV SOSY
PREFILLED_SYRINGE | INTRAVENOUS | Status: DC | PRN
  Administered 2024-03-05: 100 mg via INTRAVENOUS

## 2024-03-05 NOTE — Progress Notes (Signed)
 The patient underwent an EGD today with the findings of a normal EGD without any food impacted as the patient had been feeling.  Nothing further to do from a GI point of view since we do not have any pneumatic dilation devices for her achalasia and she should follow-up at [facility] in the future for her achalasia.  I will sign off.  Please call if any further GI concerns or questions.  We would like to thank you for the opportunity to participate in the care of Diamond Rice.

## 2024-03-05 NOTE — Plan of Care (Signed)
  Problem: Coping: Goal: Ability to adjust to condition or change in health will improve Outcome: Progressing   Problem: Health Behavior/Discharge Planning: Goal: Ability to identify and utilize available resources and services will improve Outcome: Progressing Goal: Ability to manage health-related needs will improve Outcome: Progressing   Problem: Metabolic: Goal: Ability to maintain appropriate glucose levels will improve Outcome: Progressing   Problem: Nutritional: Goal: Maintenance of adequate nutrition will improve Outcome: Progressing Goal: Progress toward achieving an optimal weight will improve Outcome: Progressing   Problem: Tissue Perfusion: Goal: Adequacy of tissue perfusion will improve Outcome: Progressing   Problem: Education: Goal: Knowledge of General Education information will improve Description: Including pain rating scale, medication(s)/side effects and non-pharmacologic comfort measures Outcome: Progressing   Problem: Health Behavior/Discharge Planning: Goal: Ability to manage health-related needs will improve Outcome: Progressing

## 2024-03-05 NOTE — Anesthesia Procedure Notes (Signed)
 Procedure Name: Intubation Date/Time: 03/05/2024 11:09 AM  Performed by: Dyane Mass, CRNAPre-anesthesia Checklist: Patient identified, Emergency Drugs available, Suction available and Patient being monitored Patient Re-evaluated:Patient Re-evaluated prior to induction Oxygen  Delivery Method: Circle system utilized Preoxygenation: Pre-oxygenation with 100% oxygen  Induction Type: IV induction Ventilation: Mask ventilation without difficulty Laryngoscope Size: McGrath and 3 Grade View: Grade I Tube type: Oral Tube size: 7.0 mm Number of attempts: 1 Airway Equipment and Method: Stylet and Oral airway Placement Confirmation: ETT inserted through vocal cords under direct vision, positive ETCO2 and breath sounds checked- equal and bilateral Secured at: 20 cm Tube secured with: Tape Dental Injury: Teeth and Oropharynx as per pre-operative assessment

## 2024-03-05 NOTE — Op Note (Signed)
 [facility] [name] Procedure Date: 03/05/2024 10:33 AM MRN: [id number] Account #: [id number] Date of Birth: 11-Oct-1970 Admit Type: Outpatient Age: 53 Room: [facility] ENDO ROOM 4 Gender: Female Note Status: Finalized Instrument Name: Upper Endoscope [id number] Procedure:             Upper GI endoscopy Indications:           Foreign body in the esophagus Providers:             Rogelia Copping MD, MD Referring MD:          No Local Md, MD (Referring MD) Medicines:             General Anesthesia Complications:         No immediate complications. Procedure:             Pre-Anesthesia Assessment:                        - Prior to the procedure, a History and Physical was                         performed, and patient medications and allergies were                         reviewed. The patient's tolerance of previous                         anesthesia was also reviewed. The risks and benefits                         of the procedure and the sedation options and risks                         were discussed with the patient. All questions were                         answered, and informed consent was obtained. Prior                         Anticoagulants: The patient has taken no anticoagulant                         or antiplatelet agents. ASA Grade Assessment: III - A                         patient with severe systemic disease. After reviewing                         the risks and benefits, the patient was deemed in                         satisfactory condition to undergo the procedure.                        After obtaining informed consent, the endoscope was                         passed under direct vision. Throughout the procedure,  the patient's blood pressure, pulse, and oxygen                          saturations were monitored continuously. The Endoscope                         was introduced through the mouth,  and advanced to the                         second part of duodenum. The upper GI endoscopy was                         accomplished without difficulty. The patient tolerated                         the procedure well. Findings:      The examined esophagus was normal.      The stomach was normal.      Localized moderate inflammation characterized by erosions was found in       the ampulla. Impression:            - Normal esophagus.                        - Normal stomach.                        - Duodenitis.                        - No specimens collected. Recommendation:        - Return patient to hospital ward for ongoing care.                        - Resume previous diet.                        - Continue present medications. Procedure Code(s):     --- Professional ---                        [phone], Esophagogastroduodenoscopy, flexible,                         transoral; diagnostic, including collection of                         specimen(s) by brushing or washing, when performed                         (separate procedure) Diagnosis Code(s):     --- Professional ---                        T18.108A, Unspecified foreign body in esophagus                         causing other injury, initial encounter                        K29.80, Duodenitis without bleeding CPT copyright 2022 [employer]. All rights reserved. The codes documented in this report are preliminary and  upon coder review may  be revised to meet current compliance requirements. Rogelia Copping MD, MD 03/05/2024 11:17:58 AM This report has been signed electronically. Number of Addenda: 0 Note Initiated On: 03/05/2024 10:33 AM Estimated Blood Loss:  Estimated blood loss: none.      [facility]

## 2024-03-05 NOTE — Assessment & Plan Note (Signed)
 Sliding scale insulin coverage

## 2024-03-05 NOTE — ED Notes (Signed)
 This RN did intentional rounding late d/t getting and EMS right at shift change. Assigned Pt to myself and did rounding at earliest time this RN could.

## 2024-03-05 NOTE — Assessment & Plan Note (Signed)
 Holding home meds until after procedure

## 2024-03-05 NOTE — Assessment & Plan Note (Signed)
Hydralazine IV as needed while n.p.o. 

## 2024-03-05 NOTE — ED Notes (Signed)
 Water given for po chal

## 2024-03-05 NOTE — Assessment & Plan Note (Signed)
 DuoNebs as needed

## 2024-03-05 NOTE — Consult Note (Signed)
 Diamond Copping, MD [facility]  [address] [place], [place] [address] Phone: [phone] Fax : [phone]  Consultation  Referring Provider:     Dr. Cleatus Primary Care Physician:  [place] Primary Gastroenterologist: [facility]         Reason for Consultation:     Dysphagia  Date of Admission:  03/05/2024 Date of Consultation:  03/05/2024         HPI:   Diamond Rice is a 53 y.o. female who has a history of multiple EGDs with Dr. Therisa, Dr. Unk, Dr. Burnette, Dr. UNK GI with history of dysphagia and achalasia with pneumatic dilation in 2023 at [facility].  The patient has also seen Dr. Charlanne in [place] and Dr. Saintclair.  The patient now comes in with a report of having the feeling of food being stuck in her esophagus.  She also had reported that she felt like she was having a urinary tract infection also. She reports that she had some fish on Saturday and feels like something might have gotten stuck in her throat at that time.  She also states that it is affecting her voice.  A consult was called last night to Dr. Onita who suggested the patient undergo an EGD today.  Past Medical History:  Diagnosis Date   Acute ischemic stroke (HCC) 12/27/2020   Acute renal failure (ARF) (HCC) 04/02/2018   Anxiety    Chronic lower back pain 04/17/2012   just got over some; catched when I walked   COPD (chronic obstructive pulmonary disease) (HCC)    Critical lower limb ischemia (HCC) 05/27/2018   Depression    Headache(784.0) 04/17/2012   ~ qod; lately waking up in am w/one   High cholesterol    Hypertension    Migraines 04/17/2012   Obesity    Sleep apnea    CPAP   Stroke (HCC)    Type II diabetes mellitus (HCC) 08/28/2002    Past Surgical History:  Procedure Laterality Date   ABDOMINAL AORTOGRAM W/LOWER EXTREMITY Left 05/27/2018   Procedure: ABDOMINAL AORTOGRAM W/LOWER EXTREMITY Runoff and Possible Intervention;  Surgeon: Sheree Penne Bruckner, MD;  Location: [facility]  INVASIVE CV LAB;  Service: Cardiovascular;  Laterality: Left;   APPLICATION OF WOUND VAC Left 06/06/2018   Procedure: APPLICATION OF WOUND VAC;  Surgeon: Sheree Penne Bruckner, MD;  Location: [facility] OR;  Service: Vascular;  Laterality: Left;   BALLOON DILATION N/A 12/31/2020   Procedure: MERRILL HODGKIN;  Surgeon: Saintclair Jasper, MD;  Location: [facility] ENDOSCOPY;  Service: Gastroenterology;  Laterality: N/A;   BIOPSY  04/04/2018   Procedure: BIOPSY;  Surgeon: Charlanne Groom, MD;  Location: [facility] ENDOSCOPY;  Service: Endoscopy;;   BIOPSY  10/27/2020   Procedure: BIOPSY;  Surgeon: Burnette Fallow, MD;  Location: [facility] ENDOSCOPY;  Service: Endoscopy;;   BOTOX  INJECTION N/A 12/31/2020   Procedure: BOTOX  INJECTION;  Surgeon: Saintclair Jasper, MD;  Location: [facility] ENDOSCOPY;  Service: Gastroenterology;  Laterality: N/A;   CARDIAC CATHETERIZATION  ~ 2011   COLONOSCOPY N/A 04/04/2018   Procedure: COLONOSCOPY;  Surgeon: Charlanne Groom, MD;  Location: [facility] ENDOSCOPY;  Service: Endoscopy;  Laterality: N/A;   COLONOSCOPY WITH PROPOFOL  N/A 10/27/2020   Procedure: COLONOSCOPY WITH PROPOFOL ;  Surgeon: Burnette Fallow, MD;  Location: [facility] ENDOSCOPY;  Service: Endoscopy;  Laterality: N/A;   ESOPHAGOGASTRODUODENOSCOPY N/A 12/31/2020   Procedure: ESOPHAGOGASTRODUODENOSCOPY (EGD);  Surgeon: Saintclair Jasper, MD;  Location: [facility] ENDOSCOPY;  Service: Gastroenterology;  Laterality: N/A;   ESOPHAGOGASTRODUODENOSCOPY (EGD) WITH PROPOFOL  N/A 10/27/2020  Procedure: ESOPHAGOGASTRODUODENOSCOPY (EGD) WITH PROPOFOL   WITH POSSIBLE DIL;  Surgeon: Burnette Fallow, MD;  Location: [facility] ENDOSCOPY;  Service: Endoscopy;  Laterality: N/A;   ESOPHAGOGASTRODUODENOSCOPY (EGD) WITH PROPOFOL  N/A 10/19/2021   Procedure: ESOPHAGOGASTRODUODENOSCOPY (EGD) WITH PROPOFOL ;  Surgeon: Unk Corinn Skiff, MD;  Location: [facility] ENDOSCOPY;  Service: Gastroenterology;  Laterality: N/A;   ESOPHAGOGASTRODUODENOSCOPY (EGD) WITH PROPOFOL  N/A 06/02/2022   Procedure: ESOPHAGOGASTRODUODENOSCOPY (EGD) WITH  PROPOFOL ;  Surgeon: Therisa Bi, MD;  Location: [facility] ENDOSCOPY;  Service: Gastroenterology;  Laterality: N/A;   FOREIGN BODY REMOVAL  12/31/2020   Procedure: FOREIGN BODY REMOVAL;  Surgeon: Saintclair Jasper, MD;  Location: [facility] ENDOSCOPY;  Service: Gastroenterology;;   LOWER EXTREMITY ANGIOGRAM Left 05/27/2018   PERIPHERAL VASCULAR INTERVENTION Left 05/27/2018   Procedure: PERIPHERAL VASCULAR INTERVENTION;  Surgeon: Sheree Penne Bruckner, MD;  Location: [facility] INVASIVE CV LAB;  Service: Cardiovascular;  Laterality: Left;   POLYPECTOMY  04/04/2018   Procedure: POLYPECTOMY;  Surgeon: Charlanne Groom, MD;  Location: [facility] ENDOSCOPY;  Service: Endoscopy;;   POLYPECTOMY  10/27/2020   Procedure: POLYPECTOMY;  Surgeon: Burnette Fallow, MD;  Location: [facility] ENDOSCOPY;  Service: Endoscopy;;   TRANSMETATARSAL AMPUTATION Left 05/28/2018   Procedure: AMPUTATION TOES THREE, FOUR AND FIVE on left foot;  Surgeon: Sheree Penne Bruckner, MD;  Location: [facility] OR;  Service: Vascular;  Laterality: Left;   WOUND DEBRIDEMENT Left 06/06/2018   Procedure: DEBRIDEMENT WOUND LEFT FOOT;  Surgeon: Sheree Penne Bruckner, MD;  Location: [facility] OR;  Service: Vascular;  Laterality: Left;    Prior to Admission medications   Medication Sig Start Date End Date Taking? Authorizing Provider  albuterol  (PROVENTIL ) (2.5 MG/3ML) 0.083% nebulizer solution Take 3 mLs (2.5 mg total) by nebulization every 4 (four) hours as needed for wheezing or shortness of breath. 07/29/21 07/29/22  Angelena Smalls, MD  ALPRAZolam  (XANAX ) 0.25 MG tablet Take 0.25 mg by mouth 3 (three) times daily as needed for anxiety or sleep. 02/09/23   [provider]  amLODipine  (NORVASC ) 10 MG tablet Take 10 mg by mouth daily. 03/11/23   [provider]  aspirin  EC 81 MG EC tablet Take 1 tablet (81 mg total) by mouth daily. 09/21/18   Noemi Reena CROME, NP  atorvastatin  (LIPITOR ) 40 MG tablet Take 2 tablets (80 mg total) by mouth daily at 6 PM. 01/03/21   Hongalgi, Anand D, MD   baclofen  (LIORESAL ) 10 MG tablet Take 0.5 tablets (5 mg total) by mouth 2 (two) times daily as needed for muscle spasms. 06/05/22   Amin, Sumayya, MD  carboxymethylcellulose (REFRESH PLUS) 0.5 % SOLN 1 drop at bedtime. Patient not taking: Reported on 09/20/2023    [provider]  carvedilol  (COREG ) 12.5 MG tablet Take 12.5 mg by mouth 2 (two) times daily with a meal.    [provider]  cloNIDine  (CATAPRES  - DOSED IN MG/24 HR) 0.3 mg/24hr patch 0.3 mg once a week. 11/08/21   [provider]  famotidine (PEPCID) 40 MG tablet Take 40 mg by mouth daily. 12/15/21   [provider]  feeding supplement (ENSURE ENLIVE / ENSURE PLUS) LIQD Take 237 mLs by mouth 2 (two) times daily between meals. Patient not taking: Reported on 09/20/2023 06/05/22   Amin, Sumayya, MD  FLUoxetine  (PROZAC ) 40 MG capsule Take 40 mg by mouth daily. 12/26/21   [provider]  hydrochlorothiazide  (HYDRODIURIL ) 25 MG tablet Take 25 mg by mouth daily. 03/02/23   [provider]  ibuprofen  (ADVIL ) 800 MG tablet Take 800 mg by mouth 3 (three) times daily.  [provider]  insulin  aspart (NOVOLOG ) 100 UNIT/ML injection Inject 0-9 Units into the skin 3 (three) times daily with meals. Correction coverage: Sensitive (thin, NPO, renal) CBG < 70: Implement Hypoglycemia protocol. CBG 70 - 120: 0 units CBG 121 - 150: 1 unit CBG 151 - 200: 2 units CBG 201 - 250: 3 units CBG 251 - 300: 5 units CBG 301 - 350: 7 units CBG 351 - 400: 9 units CBG > 400: call MD. 01/03/21   Hongalgi, Anand D, MD  LANTUS  SOLOSTAR 100 UNIT/ML Solostar Pen Inject into the skin. 03/07/23   [provider]  lidocaine  (XYLOCAINE ) 1 % (with preservative) injection SMARTSIG:Rectally 02/16/23   [provider]  lidocaine , PF, (XYLOCAINE ) 1 % SOLN injection  02/14/23   [provider]  lisinopril  (ZESTRIL ) 20 MG tablet Take 1 tablet (20 mg total) by mouth daily. 06/05/22   Amin, Sumayya, MD   LYUMJEV KWIKPEN 100 UNIT/ML KwikPen Inject into the skin. 02/16/23   [provider]  magnesium  hydroxide (MILK OF MAGNESIA) 400 MG/5ML suspension Take by mouth daily as needed for mild constipation. Patient not taking: Reported on 09/20/2023    [provider]  Melatonin 10 MG CAPS Take by mouth.    [provider]  metoCLOPramide  (REGLAN ) 10 MG/10ML SOLN Take 10 mLs (10 mg total) by mouth 3 (three) times daily before meals. 06/05/22   Amin, Sumayya, MD  mirtazapine  (REMERON ) 30 MG tablet Take 30 mg by mouth at bedtime.    [provider]  ondansetron  (ZOFRAN ) 4 MG tablet Take 8 mg by mouth 2 (two) times daily.    [provider]  ondansetron  (ZOFRAN ) 4 MG/2ML SOLN injection  03/09/23   [provider]  OZEMPIC, 1 MG/DOSE, 4 MG/3ML SOPN Inject into the skin. 03/12/23   [provider]  pantoprazole  (PROTONIX ) 40 MG tablet Take 1 tablet (40 mg total) by mouth daily for 14 days. 02/18/22 03/04/22  Angelena Smalls, MD  polyethylene glycol (MIRALAX  / GLYCOLAX ) 17 g packet Take 17 g by mouth daily. Patient not taking: Reported on 09/20/2023    [provider]  potassium chloride  SA (KLOR-CON  M) 20 MEQ tablet Take 20 mEq by mouth daily.    [provider]  promethazine  (PHENERGAN ) 25 MG/ML injection Inject 25 mg into the muscle daily. 05/25/22   [provider]  senna (SENOKOT) 8.6 MG tablet Take 1 tablet by mouth daily.    [provider]  traMADol  (ULTRAM ) 50 MG tablet Take 50 mg by mouth every 6 (six) hours as needed.    [provider]  TRULICITY 0.75 MG/0.5ML SOPN Inject into the skin. 08/10/21   [provider]  zinc oxide 20 % ointment Apply 1 Application topically as needed for irritation.    [provider]    Family History  Problem Relation Age of Onset   Stroke Brother 3   Stroke Brother 8   Heart attack Mother 68   Stroke Father 83     Social History   Tobacco  Use   Smoking status: Every Day    Current packs/day: 0.25    Average packs/day: 0.3 packs/day for 28.0 years (7.0 ttl pk-yrs)    Types: Cigarettes   Smokeless tobacco: Never  Vaping Use   Vaping status: Never Used  Substance Use Topics   Alcohol use: No    Alcohol/week: 0.0 standard drinks of alcohol    Comment: rare   Drug use: Yes    Types:  Marijuana, Crack cocaine    Comment: no drugs for past year    Allergies as of 03/04/2024 - Review Complete 03/04/2024  Allergen Reaction Noted   Morphine  and codeine Hives 10/23/2011   Metformin  Diarrhea and Other (See Comments) 05/16/2018    Review of Systems:    All systems reviewed and negative except where noted in HPI.   Physical Exam:  Vital signs in last 24 hours: Temp:  [98.6 F (37 C)-98.9 F (37.2 C)] 98.8 F (37.1 C) (07/09 0823) Pulse Rate:  [84-103] 86 (07/09 0823) Resp:  [18-25] 18 (07/09 0823) BP: (117-154)/(81-98) 117/81 (07/09 0823) SpO2:  [96 %-98 %] 98 % (07/09 0823) Weight:  [886 kg] 113 kg (07/08 2238)   General:   Pleasant, cooperative in NAD Head:  Normocephalic and atraumatic. Eyes:   No icterus.   Conjunctiva pink. PERRLA. Ears:  Normal auditory acuity. Neck:  Supple; no masses or thyroidomegaly Lungs: Respirations even and unlabored. Lungs clear to auscultation bilaterally.   No wheezes, crackles, or rhonchi.  Heart:  Regular rate and rhythm;  Without murmur, clicks, rubs or gallops Abdomen:  Soft, nondistended, nontender. Normal bowel sounds. No appreciable masses or hepatomegaly.  No rebound or guarding.  Rectal:  Not performed. Msk:  Symmetrical without gross deformities.   Extremities:  Without edema, cyanosis or clubbing. Neurologic:  Alert and oriented x3;  grossly normal neurologically. Skin:  Intact without significant lesions or rashes. Cervical Nodes:  No significant cervical adenopathy. Psych:  Alert and cooperative. Normal affect.  LAB RESULTS: Recent Labs    03/04/24 2237  WBC  11.5*  HGB 11.7*  HCT 37.8  PLT 342   BMET Recent Labs    03/04/24 2237 03/05/24 0730  NA 139 139  K 2.9* 3.2*  CL 95* 101  CO2 30 27  GLUCOSE 142* 130*  BUN 16 17  CREATININE 0.98 0.96  CALCIUM  9.2 8.6*   LFT Recent Labs    03/04/24 2237  PROT 7.6  ALBUMIN 3.0*  AST 17  ALT 15  ALKPHOS 94  BILITOT 0.6   PT/INR No results for input(s): LABPROT, INR in the last 72 hours.  STUDIES: No results found.    Impression / Plan:   Assessment: Principal Problem:   Esophageal obstruction due to food impaction, recurrent Active Problems:   Hypertension   DM (diabetes mellitus) with peripheral vascular complication (HCC)   Morbid obesity with BMI of 45.0-49.9, adult (HCC)   Hypokalemia   Chronic obstructive pulmonary disease (HCC)   History of CVA with residual deficit   Urinary tract infection   Achalasia of esophagus   Nevin Kozuch is a 53 y.o. y/o female with dysphagia and the feeling of food stuck in her esophagus with a history of achalasia and pneumatic dilation in the past.  The patient has had multiple procedures including EGDs and colonoscopies by multiple gastroenterologists in multiple locations including [facility] and [place].  Plan:  The patient will be set up for a upper endoscopy for today due to her dysphagia and feeling that food is stuck.  The patient has been told that she will need to go back to [facility] for pneumatic dilation for her achalasia in the future since that is not something we do here.  The patient has been explained the plan and agrees with the  Thank you for involving me in the care of this patient.      LOS: 0 days   Diamond Copping, MD, MD. NOLIA 03/05/2024, 9:35  AM,  Pager [phone] 7am-5pm  Check AMION for 5pm -7am coverage and on weekends   Note: This dictation was prepared with Dragon dictation along with smaller phrase technology. Any transcriptional errors that result from this process are unintentional.

## 2024-03-05 NOTE — ED Provider Notes (Signed)
 [facility] Provider Note    Event Date/Time   First MD Initiated Contact with Patient 03/05/24 0153     (approximate)   History   Foreign Body   HPI  Caroly Purewal is a 53 y.o. female with history of esophageal difficulty with history of esophageal stretching who comes in from [facility] with vomiting after attempting to eat on Saturday.  I reviewed a note from 08/01/2023 where patient was admitted and found to have inability to tolerate p.o. and found to have food bolus.  Patient reports that on Saturday that she ate some fish and she feels like something might have gotten stuck in her throat.  She reports that since then she has had intermittent nausea and vomiting difficulty keeping down any real food.  She reports that it feels similar to when she has had a food bolus before.  She denies any other pain at this time.  Physical Exam   Triage Vital Signs: ED Triage Vitals  Encounter Vitals Group     BP 03/04/24 2235 (!) 154/91     Girls Systolic BP Percentile --      Girls Diastolic BP Percentile --      Boys Systolic BP Percentile --      Boys Diastolic BP Percentile --      Pulse Rate 03/04/24 2235 (!) 103     Resp 03/04/24 2235 20     Temp 03/04/24 2235 98.9 F (37.2 C)     Temp Source 03/04/24 2235 Oral     SpO2 03/04/24 2232 98 %     Weight 03/04/24 2238 249 lb 1.9 oz (113 kg)     Height 03/04/24 2238 5' 1 (1.549 m)     Head Circumference --      Peak Flow --      Pain Score 03/04/24 2237 0     Pain Loc --      Pain Education --      Exclude from Growth Chart --     Most recent vital signs: Vitals:   03/04/24 2232 03/04/24 2235  BP:  (!) 154/91  Pulse:  (!) 103  Resp:  20  Temp:  98.9 F (37.2 C)  SpO2: 98% 97%     General: Awake, no distress.  CV:  Good peripheral perfusion.  Resp:  Normal effort.  Abd:  No distention.  Soft and nontender Other:     ED Results / Procedures / Treatments   Labs (all labs  ordered are listed, but only abnormal results are displayed) Labs Reviewed - No data to display    PROCEDURES:  Critical Care performed: No  Procedures   MEDICATIONS ORDERED IN ED: Medications  potassium chloride  10 mEq in 100 mL IVPB (has no administration in time range)     IMPRESSION / MDM / ASSESSMENT AND PLAN / ED COURSE  I reviewed the triage vital signs and the nursing notes.   Patient's presentation is most consistent with acute presentation with potential threat to life or bodily function.   Patient comes in with concerns for food bolus.  I reviewed where patient has been seen previously for her achalasia but it seems stable on her last EGD the issue is more recurrent food boluses.  She states that she feels like something stuck again like it happened a few months ago.  She is able to tolerate some sips but otherwise has not tolerated much food.  Labs ordered evaluate for Electra abnormalities,  AKI.  Labs do show some hypokalemia possible UTI although she is not sure if she is having any symptoms we will get urine culture and cover with ceftriaxone .  Discussed with Dr. Onita from GI given this impaction probably happened a few days ago any hypokalemia okay to admit to the hospitalist and they will consult on later today for evaluation.     FINAL CLINICAL IMPRESSION(S) / ED DIAGNOSES   Final diagnoses:  Nausea and vomiting, unspecified vomiting type  Hypokalemia     Rx / DC Orders   ED Discharge Orders     None        Note:  This document was prepared using Dragon voice recognition software and may include unintentional dictation errors.   Ernest Ronal BRAVO, MD 03/05/24 [phone]

## 2024-03-05 NOTE — Progress Notes (Signed)
  Progress Note   Patient: Diamond Rice FMW:[id number] DOB: Oct 05, 1970 DOA: 03/05/2024     0 DOS: the patient was seen and examined on 03/05/2024   Brief hospital course: Diamond Rice is a 53 y.o. female with medical history significant for CVA (with residual left sided deficits), dysarthria, COPD, DM2, HTN, obesity, PE, , achalasia s/p pneumatic dilation (06/16/22 and 7/30/4), food impaction 07/2023 s/p EGD at [facility] who presented with inability to tolerate po intake and feeling as if something is stuck in her throat since 03/01/24.  She also has right-sided abdominal pain and feels like she has a UTI    Principal Problem:   Esophageal obstruction due to food impaction, recurrent Active Problems:   Achalasia of esophagus   Urinary tract infection   DM (diabetes mellitus) with peripheral vascular complication (HCC)   Hypokalemia   Hypertension   Chronic obstructive pulmonary disease (HCC)   History of CVA with residual deficit   Morbid obesity with BMI of 45.0-49.9, adult (HCC)   Assessment and Plan: * Esophageal obstruction due to food impaction,  Achalasia with prior dilatations Seen by GI, EGD showed a normal esophagus.  Start soft diet, if patient tolerated, may be discharged tomorrow to [facility].  Urinary tract infection Rocephin  Urine culture pending.  Patient does not have sepsis.  If urine culture positive, will give a dose of fosfomycin tomorrow  Hypokalemia Secondary to GI losses  Replete again, recheck level tomorrow.  DM (diabetes mellitus) with peripheral vascular complication (HCC) Sliding scale insulin  coverage  History of stroke with right hemiparesis. Bedbound status. Patient will be discharged back to SNF with physical therapy  Hypertension Hydralazine  IV as needed while n.p.o.  Chronic obstructive pulmonary disease (HCC) DuoNebs as needed  Class 3 morbid obesity with BMI of 45.0-49.9, adult (HCC) Complicating factor to overall prognosis and  care       Subjective:  Patient feels better today, no abdominal pain or nausea vomiting  Physical Exam: Vitals:   03/05/24 1301 03/05/24 1336 03/05/24 1620 03/05/24 1707  BP: (!) 152/107 119/69 (!) 144/82 (!) 135/99  Pulse: 88 89 95 95  Resp: (!) 21 19 19 18   Temp:    98.2 F (36.8 C)  TempSrc:    Oral  SpO2: 99% 98% 97% 100%  Weight:      Height:       General exam: Appears calm and comfortable, Morbid obese Respiratory system: Clear to auscultation. Respiratory effort normal. Cardiovascular system: S1 & S2 heard, RRR. No JVD, murmurs, rubs, gallops or clicks. No pedal edema. Gastrointestinal system: Abdomen is nondistended, soft and nontender. No organomegaly or masses felt. Normal bowel sounds heard. Central nervous system: Alert and oriented. No focal neurological deficits. Extremities: Symmetric 5 x 5 power. Skin: No rashes, lesions or ulcers Psychiatry: Judgement and insight appear normal. Mood & affect appropriate.    Data Reviewed:  Results reviewed  Family Communication: None  Disposition: Status is: Observation     Time spent: 35 minutes  Author: Murvin Mana, MD 03/05/2024 5:09 PM  For on call review [email or web address].

## 2024-03-05 NOTE — H&P (Signed)
 History and Physical    Patient: Diamond Rice DOB: [birth year] DOA: 03/05/2024 DOS: the patient was seen and examined on 03/05/2024 PCP: [facility]  Patient coming from: Home  Chief Complaint:  Chief Complaint  Patient presents with   Foreign Body    HPI: Diamond Rice is a 53 y.o. female with medical history significant for CVA (with residual left sided deficits), dysarthria, COPD, DM2, HTN, obesity, PE, , achalasia s/p pneumatic dilation (06/16/22 and 7/30/4), food impaction 07/2023 s/p EGD at [facility] who presented with inability to tolerate po intake and feeling as if something is stuck in her throat since 03/01/24.  She also has right-sided abdominal pain and feels like she has a UTI.  She denies fever or chills. In the ED, mildly tachycardic with otherwise unremarkable vitals.  Labs notable for mild leukocytosis of 11,000, hypokalemia of 2.9 and UA consistent with UTI .  The ED provider spoke with GI, Dr. Onita who will take patient for EGD. Admission requested     Review of Systems: As mentioned in the history of present illness. All other systems reviewed and are negative.  Past Medical History:  Diagnosis Date   Acute ischemic stroke (HCC) 12/27/2020   Acute renal failure (ARF) (HCC) 04/02/2018   Anxiety    Chronic lower back pain 04/17/2012   just got over some; catched when I walked   COPD (chronic obstructive pulmonary disease) (HCC)    Critical lower limb ischemia (HCC) 05/27/2018   Depression    Headache(784.0) 04/17/2012   ~ qod; lately waking up in am w/one   High cholesterol    Hypertension    Migraines 04/17/2012   Obesity    Sleep apnea    CPAP   Stroke (HCC)    Type II diabetes mellitus (HCC) 08/28/2002   Past Surgical History:  Procedure Laterality Date   ABDOMINAL AORTOGRAM W/LOWER EXTREMITY Left 05/27/2018   Procedure: ABDOMINAL AORTOGRAM W/LOWER EXTREMITY Runoff and Possible Intervention;  Surgeon: Sheree Penne Bruckner,  MD;  Location: [facility] INVASIVE CV LAB;  Service: Cardiovascular;  Laterality: Left;   APPLICATION OF WOUND VAC Left 06/06/2018   Procedure: APPLICATION OF WOUND VAC;  Surgeon: Sheree Penne Bruckner, MD;  Location: [facility] OR;  Service: Vascular;  Laterality: Left;   BALLOON DILATION N/A 12/31/2020   Procedure: MERRILL HODGKIN;  Surgeon: Saintclair Jasper, MD;  Location: [facility] ENDOSCOPY;  Service: Gastroenterology;  Laterality: N/A;   BIOPSY  04/04/2018   Procedure: BIOPSY;  Surgeon: Charlanne Groom, MD;  Location: [facility] ENDOSCOPY;  Service: Endoscopy;;   BIOPSY  10/27/2020   Procedure: BIOPSY;  Surgeon: Burnette Fallow, MD;  Location: [facility] ENDOSCOPY;  Service: Endoscopy;;   BOTOX  INJECTION N/A 12/31/2020   Procedure: BOTOX  INJECTION;  Surgeon: Saintclair Jasper, MD;  Location: [facility] ENDOSCOPY;  Service: Gastroenterology;  Laterality: N/A;   CARDIAC CATHETERIZATION  ~ 2011   COLONOSCOPY N/A 04/04/2018   Procedure: COLONOSCOPY;  Surgeon: Charlanne Groom, MD;  Location: [facility] ENDOSCOPY;  Service: Endoscopy;  Laterality: N/A;   COLONOSCOPY WITH PROPOFOL  N/A 10/27/2020   Procedure: COLONOSCOPY WITH PROPOFOL ;  Surgeon: Burnette Fallow, MD;  Location: [facility] ENDOSCOPY;  Service: Endoscopy;  Laterality: N/A;   ESOPHAGOGASTRODUODENOSCOPY N/A 12/31/2020   Procedure: ESOPHAGOGASTRODUODENOSCOPY (EGD);  Surgeon: Saintclair Jasper, MD;  Location: [facility] ENDOSCOPY;  Service: Gastroenterology;  Laterality: N/A;   ESOPHAGOGASTRODUODENOSCOPY (EGD) WITH PROPOFOL  N/A 10/27/2020   Procedure: ESOPHAGOGASTRODUODENOSCOPY (EGD) WITH PROPOFOL   WITH POSSIBLE DIL;  Surgeon: Burnette Fallow, MD;  Location: [facility] ENDOSCOPY;  Service: Endoscopy;  Laterality: N/A;   ESOPHAGOGASTRODUODENOSCOPY (EGD) WITH PROPOFOL  N/A 10/19/2021   Procedure: ESOPHAGOGASTRODUODENOSCOPY (EGD) WITH PROPOFOL ;  Surgeon: Unk Corinn Skiff, MD;  Location: [facility] ENDOSCOPY;  Service: Gastroenterology;  Laterality: N/A;   ESOPHAGOGASTRODUODENOSCOPY (EGD) WITH PROPOFOL  N/A 06/02/2022   Procedure:  ESOPHAGOGASTRODUODENOSCOPY (EGD) WITH PROPOFOL ;  Surgeon: Therisa Bi, MD;  Location: [facility] ENDOSCOPY;  Service: Gastroenterology;  Laterality: N/A;   FOREIGN BODY REMOVAL  12/31/2020   Procedure: FOREIGN BODY REMOVAL;  Surgeon: Saintclair Jasper, MD;  Location: [facility] ENDOSCOPY;  Service: Gastroenterology;;   LOWER EXTREMITY ANGIOGRAM Left 05/27/2018   PERIPHERAL VASCULAR INTERVENTION Left 05/27/2018   Procedure: PERIPHERAL VASCULAR INTERVENTION;  Surgeon: Sheree Penne Bruckner, MD;  Location: [facility] INVASIVE CV LAB;  Service: Cardiovascular;  Laterality: Left;   POLYPECTOMY  04/04/2018   Procedure: POLYPECTOMY;  Surgeon: Charlanne Groom, MD;  Location: [facility] ENDOSCOPY;  Service: Endoscopy;;   POLYPECTOMY  10/27/2020   Procedure: POLYPECTOMY;  Surgeon: Burnette Fallow, MD;  Location: [facility] ENDOSCOPY;  Service: Endoscopy;;   TRANSMETATARSAL AMPUTATION Left 05/28/2018   Procedure: AMPUTATION TOES THREE, FOUR AND FIVE on left foot;  Surgeon: Sheree Penne Bruckner, MD;  Location: [facility] OR;  Service: Vascular;  Laterality: Left;   WOUND DEBRIDEMENT Left 06/06/2018   Procedure: DEBRIDEMENT WOUND LEFT FOOT;  Surgeon: Sheree Penne Bruckner, MD;  Location: [facility] OR;  Service: Vascular;  Laterality: Left;   Social History:  reports that she has been smoking cigarettes. She has a 7 pack-year smoking history. She has never used smokeless tobacco. She reports current drug use. Drugs: Marijuana and Crack cocaine. She reports that she does not drink alcohol.  Allergies  Allergen Reactions   Morphine  And Codeine Hives   Metformin  Diarrhea and Other (See Comments)    Allergic, per [facility]    Family History  Problem Relation Age of Onset   Stroke Brother 32   Stroke Brother 6   Heart attack Mother 92   Stroke Father 61    Prior to Admission medications   Medication Sig Start Date End Date Taking? Authorizing Provider  albuterol  (PROVENTIL ) (2.5 MG/3ML) 0.083% nebulizer solution Take 3 mLs (2.5 mg total) by nebulization  every 4 (four) hours as needed for wheezing or shortness of breath. 07/29/21 07/29/22  Angelena Smalls, MD  ALPRAZolam  (XANAX ) 0.25 MG tablet Take 0.25 mg by mouth 3 (three) times daily as needed for anxiety or sleep. 02/09/23   [provider]  amLODipine  (NORVASC ) 10 MG tablet Take 10 mg by mouth daily. 03/11/23   [provider]  aspirin  EC 81 MG EC tablet Take 1 tablet (81 mg total) by mouth daily. 09/21/18   Noemi Reena CROME, NP  atorvastatin  (LIPITOR ) 40 MG tablet Take 2 tablets (80 mg total) by mouth daily at 6 PM. 01/03/21   Hongalgi, Anand D, MD  baclofen  (LIORESAL ) 10 MG tablet Take 0.5 tablets (5 mg total) by mouth 2 (two) times daily as needed for muscle spasms. 06/05/22   Amin, Sumayya, MD  carboxymethylcellulose (REFRESH PLUS) 0.5 % SOLN 1 drop at bedtime. Patient not taking: Reported on 09/20/2023    [provider]  carvedilol  (COREG ) 12.5 MG tablet Take 12.5 mg by mouth 2 (two) times daily with a meal.    [provider]  cloNIDine  (CATAPRES  - DOSED IN MG/24 HR) 0.3 mg/24hr patch 0.3 mg once a week. 11/08/21   [provider]  famotidine (PEPCID) 40 MG tablet Take 40 mg by mouth daily. 12/15/21   [provider]  feeding supplement (ENSURE ENLIVE /  ENSURE PLUS) LIQD Take 237 mLs by mouth 2 (two) times daily between meals. Patient not taking: Reported on 09/20/2023 06/05/22   Caleen Qualia, MD  FLUoxetine  (PROZAC ) 40 MG capsule Take 40 mg by mouth daily. 12/26/21   [provider]  hydrochlorothiazide  (HYDRODIURIL ) 25 MG tablet Take 25 mg by mouth daily. 03/02/23   [provider]  ibuprofen  (ADVIL ) 800 MG tablet Take 800 mg by mouth 3 (three) times daily.    [provider]  insulin  aspart (NOVOLOG ) 100 UNIT/ML injection Inject 0-9 Units into the skin 3 (three) times daily with meals. Correction coverage: Sensitive (thin, NPO, renal) CBG < 70: Implement Hypoglycemia protocol. CBG 70 - 120: 0 units CBG 121 - 150: 1  unit CBG 151 - 200: 2 units CBG 201 - 250: 3 units CBG 251 - 300: 5 units CBG 301 - 350: 7 units CBG 351 - 400: 9 units CBG > 400: call MD. 01/03/21   Hongalgi, Anand D, MD  LANTUS  SOLOSTAR 100 UNIT/ML Solostar Pen Inject into the skin. 03/07/23   [provider]  lidocaine  (XYLOCAINE ) 1 % (with preservative) injection SMARTSIG:Rectally 02/16/23   [provider]  lidocaine , PF, (XYLOCAINE ) 1 % SOLN injection  02/14/23   [provider]  lisinopril  (ZESTRIL ) 20 MG tablet Take 1 tablet (20 mg total) by mouth daily. 06/05/22   Amin, Sumayya, MD  LYUMJEV KWIKPEN 100 UNIT/ML KwikPen Inject into the skin. 02/16/23   [provider]  magnesium  hydroxide (MILK OF MAGNESIA) 400 MG/5ML suspension Take by mouth daily as needed for mild constipation. Patient not taking: Reported on 09/20/2023    [provider]  Melatonin 10 MG CAPS Take by mouth.    [provider]  metoCLOPramide  (REGLAN ) 10 MG/10ML SOLN Take 10 mLs (10 mg total) by mouth 3 (three) times daily before meals. 06/05/22   Amin, Sumayya, MD  mirtazapine  (REMERON ) 30 MG tablet Take 30 mg by mouth at bedtime.    [provider]  ondansetron  (ZOFRAN ) 4 MG tablet Take 8 mg by mouth 2 (two) times daily.    [provider]  ondansetron  (ZOFRAN ) 4 MG/2ML SOLN injection  03/09/23   [provider]  OZEMPIC, 1 MG/DOSE, 4 MG/3ML SOPN Inject into the skin. 03/12/23   [provider]  pantoprazole  (PROTONIX ) 40 MG tablet Take 1 tablet (40 mg total) by mouth daily for 14 days. 02/18/22 03/04/22  Angelena Smalls, MD  polyethylene glycol (MIRALAX  / GLYCOLAX ) 17 g packet Take 17 g by mouth daily. Patient not taking: Reported on 09/20/2023    [provider]  potassium chloride  SA (KLOR-CON  M) 20 MEQ tablet Take 20 mEq by mouth daily.    [provider]  promethazine  (PHENERGAN ) 25 MG/ML injection Inject 25 mg into the muscle daily. 05/25/22   [provider]  senna (SENOKOT) 8.6 MG tablet Take 1 tablet by mouth daily.    [provider]  traMADol  (ULTRAM ) 50 MG tablet Take 50 mg by mouth every 6 (six) hours as needed.    [provider]  TRULICITY 0.75 MG/0.5ML SOPN Inject into the skin. 08/10/21   [provider]  zinc oxide 20 % ointment Apply 1 Application topically as needed for irritation.    [provider]    Physical Exam: Vitals:   03/04/24 2232 03/04/24 2235 03/04/24 2238 03/05/24 0500  BP:  (!) 154/91  (!) 144/98  Pulse:  (!) 103  88  Resp:  20  19  Temp:  98.9 F (37.2 C)  98.7 F (37.1 C)  TempSrc:  Oral  Oral  SpO2: 98% 97%  96%  Weight:   113 kg   Height:   5' 1 (1.549 m)    Physical Exam Vitals and nursing note reviewed.  Constitutional:      General: She is not in acute distress. HENT:     Head: Normocephalic and atraumatic.  Cardiovascular:     Rate and Rhythm: Regular rhythm. Tachycardia present.     Heart sounds: Normal heart sounds.  Pulmonary:     Effort: Pulmonary effort is normal.     Breath sounds: Normal breath sounds.  Abdominal:     Palpations: Abdomen is soft.     Tenderness: There is no abdominal tenderness.  Neurological:     Mental Status: Mental status is at baseline.     Labs on Admission: I have personally reviewed following labs and imaging studies  CBC: Recent Labs  Lab 03/04/24 2237  WBC 11.5*  NEUTROABS 6.8  HGB 11.7*  HCT 37.8  MCV 75.9*  PLT 342   Basic Metabolic Panel: Recent Labs  Lab 03/04/24 2237  NA 139  K 2.9*  CL 95*  CO2 30  GLUCOSE 142*  BUN 16  CREATININE 0.98  CALCIUM  9.2  MG 2.1   GFR: Estimated Creatinine Clearance: 79.2 mL/min (by C-G formula based on SCr of 0.98 mg/dL). Liver Function Tests: Recent Labs  Lab 03/04/24 2237  AST 17  ALT 15  ALKPHOS 94  BILITOT 0.6  PROT 7.6  ALBUMIN 3.0*   No results for input(s): LIPASE, AMYLASE in the last 168 hours. No results for input(s): AMMONIA in the  last 168 hours. Coagulation Profile: No results for input(s): INR, PROTIME in the last 168 hours. Cardiac Enzymes: No results for input(s): CKTOTAL, CKMB, CKMBINDEX, TROPONINI in the last 168 hours. BNP (last 3 results) No results for input(s): PROBNP in the last 8760 hours. HbA1C: No results for input(s): HGBA1C in the last 72 hours. CBG: No results for input(s): GLUCAP in the last 168 hours. Lipid Profile: No results for input(s): CHOL, HDL, LDLCALC, TRIG, CHOLHDL, LDLDIRECT in the last 72 hours. Thyroid Function Tests: No results for input(s): TSH, T4TOTAL, FREET4, T3FREE, THYROIDAB in the last 72 hours. Anemia Panel: No results for input(s): VITAMINB12, FOLATE, FERRITIN, TIBC, IRON, RETICCTPCT in the last 72 hours. Urine analysis:    Component Value Date/Time   COLORURINE YELLOW (A) 03/04/2024 2237   APPEARANCEUR CLOUDY (A) 03/04/2024 2237   LABSPEC 1.018 03/04/2024 2237   PHURINE 6.0 03/04/2024 2237   GLUCOSEU NEGATIVE 03/04/2024 2237   HGBUR NEGATIVE 03/04/2024 2237   BILIRUBINUR NEGATIVE 03/04/2024 2237   KETONESUR 20 (A) 03/04/2024 2237   PROTEINUR 100 (A) 03/04/2024 2237   UROBILINOGEN 0.2 08/28/2014 1143   NITRITE NEGATIVE 03/04/2024 2237   LEUKOCYTESUR LARGE (A) 03/04/2024 2237    Radiological Exams on Admission: No results found. Data Reviewed for HPI: Relevant notes from primary care and specialist visits, past discharge summaries as available in EHR, including Care Everywhere. Prior diagnostic testing as pertinent to current admission diagnoses Updated medications and problem lists for reconciliation ED course, including vitals, labs, imaging, treatment and response to treatment Triage notes, nursing and pharmacy notes and ED provider's notes Notable results as noted above in HPI      Assessment and Plan: * Esophageal obstruction due to food impaction, recurrent Achalasia with prior dilatations Keep  strict n.p.o. Formal GI consult for EGD Aspiration  precautions  Urinary tract infection Rocephin  Follow cultures  Hypokalemia Secondary to GI losses  replete and monitor  DM (diabetes mellitus) with peripheral vascular complication (HCC) Sliding scale insulin  coverage  Hypertension Hydralazine  IV as needed while n.p.o.  Chronic obstructive pulmonary disease (HCC) DuoNebs as needed  History of CVA with residual deficit Holding home meds until after procedure  Morbid obesity with BMI of 45.0-49.9, adult (HCC) Complicating factor to overall prognosis and care      DVT prophylaxis: SCD  Consults: GI Dr Onita  Advance Care Planning:   Code Status: Prior   Family Communication: none  Disposition Plan: Back to previous home environment  Severity of Illness: The appropriate patient status for this patient is OBSERVATION. Observation status is judged to be reasonable and necessary in order to provide the required intensity of service to ensure the patient's safety. The patient's presenting symptoms, physical exam findings, and initial radiographic and laboratory data in the context of their medical condition is felt to place them at decreased risk for further clinical deterioration. Furthermore, it is anticipated that the patient will be medically stable for discharge from the hospital within 2 midnights of admission.   Author: Delayne LULLA Solian, MD 03/05/2024 5:32 AM  For on call review [email or web address].

## 2024-03-05 NOTE — Assessment & Plan Note (Signed)
 Complicating factor to overall prognosis and care

## 2024-03-05 NOTE — Assessment & Plan Note (Addendum)
 Achalasia with prior dilatations Keep strict n.p.o. Formal GI consult for EGD Aspiration precautions

## 2024-03-05 NOTE — Assessment & Plan Note (Signed)
 Secondary to GI losses  replete and monitor

## 2024-03-05 NOTE — Assessment & Plan Note (Signed)
 Rocephin   Follow cultures

## 2024-03-05 NOTE — Hospital Course (Signed)
 Diamond Rice is a 53 y.o. female with medical history significant for CVA (with residual left sided deficits), dysarthria, COPD, DM2, HTN, obesity, PE, , achalasia s/p pneumatic dilation (06/16/22 and 7/30/4), food impaction 07/2023 s/p EGD at [facility] who presented with inability to tolerate po intake and feeling as if something is stuck in her throat since 03/01/24.  She also has right-sided abdominal pain and feels like she has a UTI.  Urine culture grew E. coli, will give a dose of fosfomycin before discharge

## 2024-03-05 NOTE — Anesthesia Preprocedure Evaluation (Addendum)
 Anesthesia Evaluation  Patient identified by MRN, date of birth, ID band Patient awake    Reviewed: Allergy & Precautions, NPO status , Patient's Chart, lab work & pertinent test results  History of Anesthesia Complications Negative for: history of anesthetic complications  Airway Mallampati: IV   Neck ROM: Full    Dental  (+) Edentulous Upper, Missing   Pulmonary sleep apnea , COPD, Current Smoker (occasional) and Patient abstained from smoking.   Pulmonary exam normal breath sounds clear to auscultation       Cardiovascular hypertension, + Peripheral Vascular Disease  Normal cardiovascular exam Rhythm:Regular Rate:Normal  Hx PE  Echo 12/10/23:   1. Left ventricular ejection fraction, by estimation, is 65 to 70%. The left ventricle has normal function. The left ventricle has no regional wall motion abnormalities. There is moderate concentric left ventricular hypertrophy. Left ventricular diastolic parameters are consistent with Grade I diastolic dysfunction (impaired relaxation).   2. Right ventricular systolic function is normal. The right ventricular size is normal.   3. Left atrial size was mild to moderately dilated.   4. The mitral valve is normal in structure. No evidence of mitral valve regurgitation. No evidence of mitral stenosis.   5. The aortic valve is tricuspid. There is mild calcification of the aortic valve. Aortic valve regurgitation is mild to moderate. No aortic stenosis is present. Aortic regurgitation PHT measures 678 msec.   6. Aortic dilatation noted. There is mild dilatation of the ascending aorta, measuring 40 mm.   7. The inferior vena cava is dilated in size with <50% respiratory variability, suggesting right atrial pressure of 15 mmHg.    Neuro/Psych  Headaches PSYCHIATRIC DISORDERS Anxiety Depression Bipolar Disorder Schizophrenia  Polysubstance use disorder including marijuana (last use 1 month ago) and  cocaine (last use 3 years ago); chronic pain CVA (2022, residual left-sided weakness)    GI/Hepatic negative GI ROS,,,  Endo/Other  diabetes, Type 2, Insulin  Dependent  Class 3 obesity  Renal/GU Renal disease (stage III CKD)     Musculoskeletal   Abdominal   Peds  Hematology  (+) Blood dyscrasia, anemia   Anesthesia Other Findings Last dose of Ozempic 02/27/24. Pt reports not taking Trulicity.   Cardiology note 09/20/23:  ASSESSMENT AND PLAN:  AI:   Her AI appears to be moderate.   I will follow-up with an echocardiogram in April.    LVH: This was moderate and likely related to difficult to control hypertension.  This will be assessed again at the time of her echo.   Hypertension:   The blood pressure is well-controlled.  She should get this checked at least once a week at her facility.   Sleep apnea:     We are going to get her referred to a new sleep doctor as she currently does not have CPAP.  She said they took away the facility.    DM:   A1c was 7.4.  No change in therapy.  This should be managed by her primary providers.   Risk reduction: LDL has not been tested as far as I can see in the last year.  Previously was 60.  Have given written instructions for her to get a lipid profile.     Follow up with me in one year.    Reproductive/Obstetrics                              Anesthesia Physical Anesthesia Plan  ASA: 3  and emergent  Anesthesia Plan: General   Post-op Pain Management:    Induction: Rapid sequence and Intravenous  PONV Risk Score and Plan: 2 and Ondansetron , Dexamethasone  and Treatment may vary due to age or medical condition  Airway Management Planned: Oral ETT  Additional Equipment:   Intra-op Plan:   Post-operative Plan: Extubation in OR  Informed Consent: I have reviewed the patients History and Physical, chart, labs and discussed the procedure including the risks, benefits and alternatives for the proposed  anesthesia with the patient or authorized representative who has indicated his/her understanding and acceptance.     Dental advisory given  Plan Discussed with: CRNA  Anesthesia Plan Comments: (Patient consented for risks of anesthesia including but not limited to:  - adverse reactions to medications - damage to eyes, teeth, lips or other oral mucosa - nerve damage due to positioning  - sore throat or hoarseness - damage to heart, brain, nerves, lungs, other parts of body or loss of life  Informed patient about role of CRNA in peri- and intra-operative care.  Patient voiced understanding.)         Anesthesia Quick Evaluation

## 2024-03-05 NOTE — Transfer of Care (Signed)
 Immediate Anesthesia Transfer of Care Note  Patient: Diamond Rice  Procedure(s) Performed: EGD (ESOPHAGOGASTRODUODENOSCOPY)  Patient Location: Endoscopy Unit  Anesthesia Type:General  Level of Consciousness: awake, alert , and oriented  Airway & Oxygen  Therapy: Patient Spontanous Breathing and Patient connected to nasal cannula oxygen   Post-op Assessment: Report given to RN and Post -op Vital signs reviewed and stable  Post vital signs: Reviewed and stable  Last Vitals:  Vitals Value Taken Time  BP 149/101 03/05/24 11:27  Temp 36.6 C 03/05/24 11:28  Pulse 93 03/05/24 11:34  Resp 20 03/05/24 11:34  SpO2 98 % 03/05/24 11:34  Vitals shown include unfiled device data.  Last Pain:  Vitals:   03/05/24 1128  TempSrc: Temporal  PainSc: 0-No pain         Complications: No notable events documented.

## 2024-03-06 ENCOUNTER — Encounter: Payer: Self-pay | Admitting: Gastroenterology

## 2024-03-06 DIAGNOSIS — T18128A Food in esophagus causing other injury, initial encounter: Secondary | ICD-10-CM | POA: Diagnosis not present

## 2024-03-06 DIAGNOSIS — T18108A Unspecified foreign body in esophagus causing other injury, initial encounter: Secondary | ICD-10-CM | POA: Diagnosis not present

## 2024-03-06 DIAGNOSIS — W44F3XA Food entering into or through a natural orifice, initial encounter: Secondary | ICD-10-CM | POA: Diagnosis not present

## 2024-03-06 DIAGNOSIS — K220 Achalasia of cardia: Secondary | ICD-10-CM | POA: Diagnosis not present

## 2024-03-06 DIAGNOSIS — N3000 Acute cystitis without hematuria: Secondary | ICD-10-CM | POA: Diagnosis not present

## 2024-03-06 LAB — GLUCOSE, CAPILLARY
Glucose-Capillary: 273 mg/dL — ABNORMAL HIGH (ref 70–99)
Glucose-Capillary: 201 mg/dL — ABNORMAL HIGH (ref 70–99)
Glucose-Capillary: 173 mg/dL — ABNORMAL HIGH (ref 70–99)
Glucose-Capillary: 181 mg/dL — ABNORMAL HIGH (ref 70–99)
Glucose-Capillary: 231 mg/dL — ABNORMAL HIGH (ref 70–99)

## 2024-03-06 LAB — PHOSPHORUS: Phosphorus: 1.7 mg/dL — ABNORMAL LOW (ref 2.5–4.6)

## 2024-03-06 LAB — BASIC METABOLIC PANEL WITH GFR
Anion gap: 9 (ref 5–15)
BUN: 11 mg/dL (ref 6–20)
CO2: 26 mmol/L (ref 22–32)
Calcium: 8.8 mg/dL — ABNORMAL LOW (ref 8.9–10.3)
Chloride: 103 mmol/L (ref 98–111)
Creatinine, Ser: 0.72 mg/dL (ref 0.44–1.00)
GFR, Estimated: >60 mL/min (ref >60)
Glucose, Bld: 227 mg/dL — ABNORMAL HIGH (ref 70–99)
Potassium: 4 mmol/L (ref 3.5–5.1)
Sodium: 138 mmol/L (ref 135–145)

## 2024-03-06 LAB — MAGNESIUM: Magnesium: 2 mg/dL (ref 1.7–2.4)

## 2024-03-06 MED ORDER — GLUCERNA SHAKE PO LIQD
237.0000 mL | Freq: Three times a day (TID) | ORAL | Status: DC
  Administered 2024-03-06 (×3): 237 mL via ORAL

## 2024-03-06 MED ORDER — SODIUM PHOSPHATES 45 MMOLE/15ML IV SOLN
30.0000 mmol | Freq: Once | INTRAVENOUS | Status: AC
  Administered 2024-03-06: 30 mmol via INTRAVENOUS
  Filled 2024-03-06: qty 10

## 2024-03-06 MED ORDER — FOSFOMYCIN TROMETHAMINE 3 G PO PACK
3.0000 g | PACK | Freq: Once | ORAL | Status: AC
  Administered 2024-03-06: 3 g via ORAL
  Filled 2024-03-06: qty 3

## 2024-03-06 NOTE — Discharge Summary (Signed)
 Physician Discharge Summary   Patient: Diamond Rice MRN: [id number] DOB: Jan 03, 1971  Admit date:     03/05/2024  Discharge date: 03/06/24  Discharge Physician: Murvin Mana   PCP: [place]   Recommendations at discharge:   Follow-up with PCP in 1 week. Follow-up with the GI in [facility] as previous scheduled  Discharge Diagnoses: Principal Problem:   Esophageal obstruction due to food impaction, recurrent Active Problems:   Achalasia of esophagus   Urinary tract infection   DM (diabetes mellitus) with peripheral vascular complication (HCC)   Hypokalemia   Hypertension   Chronic obstructive pulmonary disease (HCC)   History of CVA with residual deficit   Morbid obesity with BMI of 45.0-49.9, adult (HCC)   Hypophosphatemia  Resolved Problems:   * No resolved hospital problems. *  Hospital Course: Diamond Rice is a 53 y.o. female with medical history significant for CVA (with residual left sided deficits), dysarthria, COPD, DM2, HTN, obesity, PE, , achalasia s/p pneumatic dilation (06/16/22 and 7/30/4), food impaction 07/2023 s/p EGD at [facility] who presented with inability to tolerate po intake and feeling as if something is stuck in her throat since 03/01/24.  She also has right-sided abdominal pain and feels like she has a UTI.  Urine culture grew E. coli, will give a dose of fosfomycin before discharge  Assessment and Plan: * Esophageal obstruction due to food impaction,  Achalasia with prior dilatations Seen by GI, EGD showed a normal esophagus.  She is started on soft diet after procedure, she tolerating well.  She has scheduled appointment with [facility].  She is currently medically stable for discharge   Urinary tract infection due to E. coli Deceived Rocephin .  At discharge, patient will be given a dose of fosfomycin.  Patient does not have sepsis.    Hypokalemia Hypophosphatemia Secondary to GI losses  Potassium normalized, give a dose of sodium phosphate   before discharge   DM (diabetes mellitus) with peripheral vascular complication (HCC) Sliding scale insulin  coverage   History of stroke with right hemiparesis. Bedbound status. Patient will be discharged back to SNF with physical therapy   Hypertension Blood pressure medicine medicine adjusted Blood pressure in the hospital.  Uncontrolled type 2 diabetes with hyperglycemia on long-term use of insulin . Resumed home dose insulin    Chronic obstructive pulmonary disease (HCC) DuoNebs as needed   Class 3 morbid obesity with BMI of 45.0-49.9, adult (HCC) Complicating factor to overall prognosis and care           Consultants: GI Procedures performed: EGD  Disposition: Nursing home Diet recommendation:  Discharge Diet Orders (From admission, onward)     Start     Ordered   03/06/24 0000  Diet general       Comments: Soft diet   03/06/24 1032           DISCHARGE MEDICATION: Allergies as of 03/06/2024       Reactions   Morphine  And Codeine Hives   Metformin  Diarrhea, Other (See Comments)   Allergic, per [facility]        Medication List     STOP taking these medications    amLODipine  10 MG tablet Commonly known as: NORVASC    hydrochlorothiazide  25 MG tablet Commonly known as: HYDRODIURIL    lisinopril  40 MG tablet Commonly known as: ZESTRIL        TAKE these medications    acetaminophen  500 MG tablet Commonly known as: TYLENOL  Take 500 mg by mouth 3 (three) times daily.  aspirin  EC 81 MG tablet Take 1 tablet (81 mg total) by mouth daily.   atorvastatin  40 MG tablet Commonly known as: LIPITOR  Take 2 tablets (80 mg total) by mouth daily at 6 PM.   baclofen  10 MG tablet Commonly known as: LIORESAL  Take 5 mg by mouth 3 (three) times daily.   bisacodyl  10 MG suppository Commonly known as: DULCOLAX Place 10 mg rectally as needed for moderate constipation.   carvedilol  25 MG tablet Commonly known as: COREG  Take 25 mg by mouth 2 (two) times  daily with a meal.   cloNIDine  0.3 mg/24hr patch Commonly known as: CATAPRES  - Dosed in mg/24 hr 0.3 mg once a week.   famotidine 40 MG tablet Commonly known as: PEPCID Take 40 mg by mouth daily.   feeding supplement Liqd Take 237 mLs by mouth 2 (two) times daily between meals.   ferrous sulfate  220 (44 Fe) MG/5ML solution Take 7 mLs by mouth every Monday, Wednesday, and Friday.   FLUoxetine  40 MG capsule Commonly known as: PROZAC  Take 80 mg by mouth daily.   Gvoke HypoPen  1-Pack 1 MG/0.2ML Soaj Generic drug: Glucagon  Inject 1 mg into the skin as needed.   HumuLIN R  100 UNIT/ML injection Generic drug: insulin  regular Inject 0-9 Units into the skin 3 (three) times daily before meals.   Lantus  SoloStar 100 UNIT/ML Solostar Pen Generic drug: insulin  glargine Inject 15 Units into the skin at bedtime.   lidocaine  4 % cream Commonly known as: LMX Apply 1 Application topically daily.   magnesium  hydroxide 400 MG/5ML suspension Commonly known as: MILK OF MAGNESIA Take by mouth daily as needed for mild constipation.   melatonin 5 MG Tabs Take 5 mg by mouth at bedtime.   nitroGLYCERIN  0.4 MG SL tablet Commonly known as: NITROSTAT  Place 0.4 mg under the tongue every 5 (five) minutes as needed for chest pain.   oxyCODONE  5 MG/5ML solution Commonly known as: ROXICODONE  Take 5 mLs by mouth every 8 (eight) hours as needed for moderate pain (pain score 4-6) or severe pain (pain score 7-10).   pantoprazole  sodium 40 mg Commonly known as: PROTONIX  Take 40 mg by mouth daily.   polyethylene glycol 17 g packet Commonly known as: MIRALAX  / GLYCOLAX  Take 17 g by mouth every Monday, Wednesday, and Friday.   Potassium Chloride  10 % Soln Take 15 mLs by mouth daily.   promethazine  25 MG/ML injection Commonly known as: PHENERGAN  Inject 25 mg into the muscle as needed for nausea or vomiting.   Semaglutide (1 MG/DOSE) 2 MG/1.5ML Sopn Inject 2 mg into the skin every Wednesday.    senna 8.6 MG tablet Commonly known as: SENOKOT Take 1 tablet by mouth daily.   traZODone  50 MG tablet Commonly known as: DESYREL  Take 25 mg by mouth at bedtime.        Follow-up Information     [facility] Follow up in 1 week(s).   Specialty: Geriatric Medicine Contact information: [address] [place] [address] [phone]                Discharge Exam: Diamond Rice   03/04/24 2238  Weight: 113 kg   General exam: Appears calm and comfortable, morbid obese Respiratory system: Clear to auscultation. Respiratory effort normal. Cardiovascular system: S1 & S2 heard, RRR. No JVD, murmurs, rubs, gallops or clicks. No pedal edema. Gastrointestinal system: Abdomen is nondistended, soft and nontender. No organomegaly or masses felt. Normal bowel sounds heard. Central nervous system: Alert  and oriented. No focal neurological deficits. Extremities: Symmetric 5 x 5 power. Skin: No rashes, lesions or ulcers Psychiatry: Judgement and insight appear normal. Mood & affect appropriate.    Condition at discharge: good  The results of significant diagnostics from this hospitalization (including imaging, microbiology, ancillary and laboratory) are listed below for reference.   Imaging Studies: No results found.  Microbiology: Results for orders placed or performed during the hospital encounter of 03/05/24  Urine Culture     Status: Abnormal (Preliminary result)   Collection Time: 03/04/24 10:37 PM   Specimen: Urine, Clean Catch  Result Value Ref Range Status   Specimen Description   Final    URINE, CLEAN CATCH Performed at [facility], [address], [place], [place] [address]    Special Requests   Final    NONE Performed at [facility], [address], [place], [place] [address]    Culture (A)  Final    >=100,000 COLONIES/mL VONNE NEGATIVE RODS SUSCEPTIBILITIES TO FOLLOW Performed at [facility],  [address] [place], [place] [address]    Report Status PENDING  Incomplete    Labs: CBC: Recent Labs  Lab 03/04/24 2237  WBC 11.5*  NEUTROABS 6.8  HGB 11.7*  HCT 37.8  MCV 75.9*  PLT 342   Basic Metabolic Panel: Recent Labs  Lab 03/04/24 2237 03/05/24 0730 03/06/24 0637  NA 139 139 138  K 2.9* 3.2* 4.0  CL 95* 101 103  CO2 30 27 26   GLUCOSE 142* 130* 227*  BUN 16 17 11   CREATININE 0.98 0.96 0.72  CALCIUM  9.2 8.6* 8.8*  MG 2.1 2.0 2.0  PHOS  --  2.4* 1.7*   Liver Function Tests: Recent Labs  Lab 03/04/24 2237  AST 17  ALT 15  ALKPHOS 94  BILITOT 0.6  PROT 7.6  ALBUMIN 3.0*   CBG: Recent Labs  Lab 03/05/24 0828 03/05/24 1710 03/05/24 2041 03/06/24 0032 03/06/24 0807  GLUCAP 135* 196* 374* 273* 201*    Discharge time spent: greater than 30 minutes.  Signed: Murvin Mana, MD [facility] 03/06/2024

## 2024-03-06 NOTE — TOC Transition Note (Addendum)
 Transition of Care [facility]) - Progression Note    Patient Details  Name: Norine Reddington MRN: [id number] Date of Birth: 03/09/1971  Transition of [facility]) CM/SW Contact  Tomasa JAYSON Childes, RN Phone Number: 03/06/2024, 12:58 PM  Clinical Narrative:    [place] with [place]  in admissions @ [facility]  Per facility patient admission confirmed for today. Patient assigned room # 34-A Report will be called to [phone] Face sheet and medical necessity forms printed to the floor to be added to the EMS pack EMS arranged for 4:30pm Discharge summary and SNF transfer report sent in HUB.  Nurse provided with details  TOC signing off.      Barriers to Discharge: Barriers Resolved  Expected Discharge Plan and Services         Expected Discharge Date: 03/06/24                                     Social Determinants of Health (SDOH) Interventions SDOH Screenings   Food Insecurity: No Food Insecurity (03/05/2024)  Housing: Low Risk  (03/05/2024)  Transportation Needs: No Transportation Needs (03/05/2024)  Utilities: Not At Risk (03/05/2024)  Alcohol Screen: Medium Risk (01/11/2018)  Depression (PHQ2-9): Medium Risk (10/27/2020)  Financial Resource Strain: Medium Risk (08/02/2023)   Received from [facility]  Tobacco Use: High Risk (03/04/2024)    Readmission Risk Interventions     No data to display

## 2024-03-06 NOTE — NC FL2 (Signed)
 [place]  MEDICAID FL2 LEVEL OF CARE FORM     IDENTIFICATION  [name] Birthdate: 11/16/1970 Sex: female Admission Date (Current Location): 03/05/2024  [facility] and [place] Number:  Chiropodist and Address:  [facility], [address], [place], [place] [address]      Provider Number: [id number]  Attending Physician Name and Address:  Laurita Pillion, MD  Relative Name and Phone Number:  [name] Fujisawa (Sister)  [phone] [facility])    Current Level of Care: Hospital Recommended Level of Care: Skilled Nursing Facility Prior Approval Number:    Date Approved/Denied:   PASRR Number: [id number]  Discharge Plan: SNF    Current Diagnoses: Patient Active Problem List   Diagnosis Date Noted   Hypophosphatemia 03/06/2024   Atherosclerosis of artery of extremity with intermittent claudication (HCC) 05/17/2023   Achalasia of esophagus 05/31/2022   Pain of lower extremity 12/19/2021   Achalasia 08/17/2021   Dysphagia 08/17/2021   Acute CVA (cerebrovascular accident) (HCC) 01/18/2021   Dysarthria    Urinary tract infection    Abnormal finding on lung imaging 12/27/2020   Severe episode of recurrent major depressive disorder, with psychotic features (HCC)    Cerebrovascular accident (CVA) (HCC) 12/06/2019   AKI (acute kidney injury) (HCC) 12/06/2019   LVH (left ventricular hypertrophy) 08/10/2019   Edema of lower extremity 11/26/2018   Loss of appetite 11/26/2018   Urinary incontinence 11/26/2018   Allergic rhinitis 11/14/2018   Deep venous thrombosis (HCC) 11/14/2018   Nicotine  dependence 11/14/2018   Hemorrhagic stroke (HCC)    Diastolic dysfunction    Type 2 diabetes mellitus (HCC)    Anemia of chronic disease    Chronic obstructive pulmonary disease (HCC)    ICH (intracerebral hemorrhage) (HCC) 08/30/2018   Hypertensive heart disease without heart failure 07/04/2018   Nonrheumatic aortic valve insufficiency  07/04/2018   Esophageal obstruction due to food impaction, recurrent    Class 2 severe obesity due to excess calories with serious comorbidity and body mass index (BMI) of 35.0 to 35.9 in adult [facility])    Peripheral vascular disease (HCC)    History of osteomyelitis    History of amputation of lesser toe (HCC)    Pulmonary embolism (HCC) 06/22/2018   Microcytic anemia 06/17/2018   At risk for adverse drug reaction 06/11/2018   Critical lower limb ischemia (HCC) 05/27/2018   GI bleeding 04/02/2018   Hypokalemia 04/02/2018   Polysubstance abuse (HCC) 04/02/2018   Diabetic foot ulcer (HCC) 04/02/2018   Tinea pedis 03/07/2018   Diabetic peripheral neuropathy (HCC) 01/24/2018   Cannabis use disorder, moderate, dependence (HCC) 05/19/2017   Cocaine use disorder, moderate, dependence (HCC) 05/19/2017   CKD (chronic kidney disease) stage 3, GFR 30-59 ml/min (HCC) 02/17/2017   Noncompliance with medications 02/17/2017   Suicide attempt (HCC) 11/06/2016   Mild cognitive impairment 03/21/2016   Moderate episode of recurrent major depressive disorder (HCC) 03/21/2016   History of CVA with residual deficit 12/30/2015   Schizoaffective disorder, bipolar type (HCC) 10/07/2015   OSA (obstructive sleep apnea) 06/09/2015   Vitamin D  deficiency 01/06/2015   Bilateral knee pain 01/05/2015   Numbness and tingling in right hand 01/05/2015   Insomnia 12/07/2014   Right-sided lacunar infarction (HCC) 09/15/2014   Left hemiparesis (HCC) 09/15/2014   Dyspnea    Back pain 09/11/2013   Psychoactive substance-induced organic mood disorder (HCC) 05/28/2013   Cocaine abuse with cocaine-induced mood disorder (HCC) 05/28/2013   Cannabis dependence with cannabis-induced anxiety disorder (HCC)  05/28/2013   Morbid obesity with BMI of 45.0-49.9, adult (HCC) 04/25/2012   Chest pain 04/17/2012   Hypertension 04/17/2012   DM (diabetes mellitus) with peripheral vascular complication (HCC) 04/17/2012   Hyperlipidemia  04/17/2012   Tobacco use 04/17/2012    Orientation RESPIRATION BLADDER Height & Weight     Self, Time, Situation, Place  Normal Continent Weight: 113 kg Height:  5' 1 (154.9 cm)  BEHAVIORAL SYMPTOMS/MOOD NEUROLOGICAL BOWEL NUTRITION STATUS  Other (Comment) (n/a)  (n/a) Continent Diet (DYS 3)  AMBULATORY STATUS COMMUNICATION OF NEEDS Skin   Limited Assist Verbally Normal                       Personal Care Assistance Level of Assistance  Bathing, Feeding, Dressing Bathing Assistance: Limited assistance Feeding assistance: Limited assistance Dressing Assistance: Limited assistance     Functional Limitations Info  Sight, Hearing Sight Info: Adequate Hearing Info: Adequate      SPECIAL CARE FACTORS FREQUENCY                       Contractures      Additional Factors Info  Code Status, Allergies Code Status Info: FULL Allergies Info: Morphine  And Codeine, Metformin            Current Medications (03/06/2024):  This is the current hospital active medication list Current Facility-Administered Medications  Medication Dose Route Frequency Provider Last Rate Last Admin   acetaminophen  (TYLENOL ) tablet 650 mg  650 mg Oral Q6H PRN Duncan, Hazel V, MD   650 mg at 03/05/24 1942   Or   acetaminophen  (TYLENOL ) suppository 650 mg  650 mg Rectal Q6H PRN Cleatus Delayne GAILS, MD       cefTRIAXone  (ROCEPHIN ) 1 g in sodium chloride  0.9 % 100 mL IVPB  1 g Intravenous Q24H Duncan, Hazel V, MD   Stopped at 03/06/24 0538   feeding supplement (GLUCERNA SHAKE) (GLUCERNA SHAKE) liquid 237 mL  237 mL Oral TID BM Zhang, Dekui, MD   237 mL at 03/06/24 9078   hydrALAZINE  (APRESOLINE ) injection 5 mg  5 mg Intravenous Q4H PRN Duncan, Hazel V, MD       insulin  aspart (novoLOG ) injection 0-9 Units  0-9 Units Subcutaneous TID WC & HS Mansy, Madison LABOR, MD   3 Units at 03/06/24 9078   morphine  (PF) 2 MG/ML injection 2 mg  2 mg Intravenous Q2H PRN Duncan, Hazel V, MD       ondansetron  (ZOFRAN ) tablet  4 mg  4 mg Oral Q6H PRN Cleatus Delayne GAILS, MD       Or   ondansetron  (ZOFRAN ) injection 4 mg  4 mg Intravenous Q6H PRN Duncan, Hazel V, MD       sodium phosphate  30 mmol in sodium chloride  0.9 % 250 mL infusion  30 mmol Intravenous Once Laurita Pillion, MD 43 mL/hr at 03/06/24 1011 30 mmol at 03/06/24 1011     Discharge Medications: Please see discharge summary for a list of discharge medications.  Relevant Imaging Results:  Relevant Lab Results:   Additional Information SSN# [id number]  Ambrose Wile C Jackson Coffield, RN

## 2024-03-06 NOTE — Anesthesia Postprocedure Evaluation (Signed)
 Anesthesia Post Note  Patient: Diamond Rice  Procedure(s) Performed: EGD (ESOPHAGOGASTRODUODENOSCOPY)  Patient location during evaluation: PACU Anesthesia Type: General Level of consciousness: awake and alert, oriented and patient cooperative Pain management: pain level controlled Vital Signs Assessment: post-procedure vital signs reviewed and stable Respiratory status: spontaneous breathing, nonlabored ventilation and respiratory function stable Cardiovascular status: blood pressure returned to baseline and stable Postop Assessment: adequate PO intake Anesthetic complications: no   No notable events documented.   Last Vitals:  Vitals:   03/06/24 0803 03/06/24 1206  BP: (!) 130/99 (!) 148/93  Pulse: 81 88  Resp: 16 16  Temp: 36.8 C 36.9 C  SpO2: 100% 98%    Last Pain:  Vitals:   03/06/24 1206  TempSrc:   PainSc: Asleep                 Alfonso Ruths

## 2024-03-06 NOTE — Care Management Obs Status (Signed)
 MEDICARE OBSERVATION STATUS NOTIFICATION   Patient Details  Name: Diamond Rice MRN: [id number] Date of Birth: Oct 05, 1970   Medicare Observation Status Notification Given:  Yes    Rojelio SHAUNNA Rattler 03/06/2024, 12:31 PM

## 2024-03-07 LAB — URINE CULTURE: Culture: >=100000 — AB

## 2024-03-14 ENCOUNTER — Emergency Department

## 2024-03-14 ENCOUNTER — Other Ambulatory Visit: Payer: Self-pay

## 2024-03-14 ENCOUNTER — Inpatient Hospital Stay
Admission: EM | Admit: 2024-03-14 | Discharge: 2024-03-28 | DRG: 299 | Disposition: E | Source: Skilled Nursing Facility | Attending: Osteopathic Medicine | Admitting: Osteopathic Medicine

## 2024-03-14 DIAGNOSIS — Z6841 Body Mass Index (BMI) 40.0 and over, adult: Secondary | ICD-10-CM

## 2024-03-14 DIAGNOSIS — F32A Depression, unspecified: Secondary | ICD-10-CM | POA: Diagnosis present

## 2024-03-14 DIAGNOSIS — F419 Anxiety disorder, unspecified: Secondary | ICD-10-CM | POA: Diagnosis present

## 2024-03-14 DIAGNOSIS — B9620 Unspecified Escherichia coli [E. coli] as the cause of diseases classified elsewhere: Secondary | ICD-10-CM | POA: Diagnosis not present

## 2024-03-14 DIAGNOSIS — Z1612 Extended spectrum beta lactamase (ESBL) resistance: Secondary | ICD-10-CM | POA: Diagnosis not present

## 2024-03-14 DIAGNOSIS — I2510 Atherosclerotic heart disease of native coronary artery without angina pectoris: Secondary | ICD-10-CM | POA: Diagnosis present

## 2024-03-14 DIAGNOSIS — E876 Hypokalemia: Secondary | ICD-10-CM | POA: Diagnosis present

## 2024-03-14 DIAGNOSIS — Z789 Other specified health status: Secondary | ICD-10-CM | POA: Diagnosis not present

## 2024-03-14 DIAGNOSIS — J9602 Acute respiratory failure with hypercapnia: Secondary | ICD-10-CM | POA: Diagnosis not present

## 2024-03-14 DIAGNOSIS — E7800 Pure hypercholesterolemia, unspecified: Secondary | ICD-10-CM | POA: Diagnosis present

## 2024-03-14 DIAGNOSIS — Z66 Do not resuscitate: Secondary | ICD-10-CM | POA: Diagnosis present

## 2024-03-14 DIAGNOSIS — K220 Achalasia of cardia: Secondary | ICD-10-CM | POA: Diagnosis present

## 2024-03-14 DIAGNOSIS — J9601 Acute respiratory failure with hypoxia: Secondary | ICD-10-CM | POA: Diagnosis not present

## 2024-03-14 DIAGNOSIS — N179 Acute kidney failure, unspecified: Secondary | ICD-10-CM | POA: Diagnosis present

## 2024-03-14 DIAGNOSIS — E119 Type 2 diabetes mellitus without complications: Secondary | ICD-10-CM | POA: Diagnosis present

## 2024-03-14 DIAGNOSIS — F17210 Nicotine dependence, cigarettes, uncomplicated: Secondary | ICD-10-CM | POA: Diagnosis present

## 2024-03-14 DIAGNOSIS — I6310 Cerebral infarction due to embolism of unspecified precerebral artery: Secondary | ICD-10-CM | POA: Diagnosis not present

## 2024-03-14 DIAGNOSIS — Z888 Allergy status to other drugs, medicaments and biological substances status: Secondary | ICD-10-CM

## 2024-03-14 DIAGNOSIS — Z794 Long term (current) use of insulin: Secondary | ICD-10-CM | POA: Diagnosis not present

## 2024-03-14 DIAGNOSIS — I63419 Cerebral infarction due to embolism of unspecified middle cerebral artery: Secondary | ICD-10-CM | POA: Diagnosis not present

## 2024-03-14 DIAGNOSIS — I7101 Dissection of ascending aorta: Principal | ICD-10-CM | POA: Diagnosis present

## 2024-03-14 DIAGNOSIS — N390 Urinary tract infection, site not specified: Secondary | ICD-10-CM | POA: Diagnosis not present

## 2024-03-14 DIAGNOSIS — E66813 Obesity, class 3: Secondary | ICD-10-CM | POA: Diagnosis present

## 2024-03-14 DIAGNOSIS — Z7982 Long term (current) use of aspirin: Secondary | ICD-10-CM

## 2024-03-14 DIAGNOSIS — Z608 Other problems related to social environment: Secondary | ICD-10-CM | POA: Diagnosis present

## 2024-03-14 DIAGNOSIS — Z7401 Bed confinement status: Secondary | ICD-10-CM

## 2024-03-14 DIAGNOSIS — Z7189 Other specified counseling: Secondary | ICD-10-CM | POA: Diagnosis not present

## 2024-03-14 DIAGNOSIS — G9341 Metabolic encephalopathy: Secondary | ICD-10-CM | POA: Diagnosis not present

## 2024-03-14 DIAGNOSIS — I110 Hypertensive heart disease with heart failure: Secondary | ICD-10-CM | POA: Diagnosis present

## 2024-03-14 DIAGNOSIS — I6340 Cerebral infarction due to embolism of unspecified cerebral artery: Secondary | ICD-10-CM | POA: Diagnosis not present

## 2024-03-14 DIAGNOSIS — R1110 Vomiting, unspecified: Secondary | ICD-10-CM | POA: Diagnosis present

## 2024-03-14 DIAGNOSIS — Z8249 Family history of ischemic heart disease and other diseases of the circulatory system: Secondary | ICD-10-CM

## 2024-03-14 DIAGNOSIS — Z79899 Other long term (current) drug therapy: Secondary | ICD-10-CM

## 2024-03-14 DIAGNOSIS — I5033 Acute on chronic diastolic (congestive) heart failure: Secondary | ICD-10-CM | POA: Diagnosis not present

## 2024-03-14 DIAGNOSIS — E875 Hyperkalemia: Secondary | ICD-10-CM | POA: Diagnosis not present

## 2024-03-14 DIAGNOSIS — Z515 Encounter for palliative care: Secondary | ICD-10-CM | POA: Diagnosis not present

## 2024-03-14 DIAGNOSIS — Z885 Allergy status to narcotic agent status: Secondary | ICD-10-CM

## 2024-03-14 DIAGNOSIS — J449 Chronic obstructive pulmonary disease, unspecified: Secondary | ICD-10-CM | POA: Diagnosis present

## 2024-03-14 DIAGNOSIS — Z823 Family history of stroke: Secondary | ICD-10-CM

## 2024-03-14 DIAGNOSIS — D649 Anemia, unspecified: Secondary | ICD-10-CM | POA: Diagnosis present

## 2024-03-14 DIAGNOSIS — I69351 Hemiplegia and hemiparesis following cerebral infarction affecting right dominant side: Secondary | ICD-10-CM | POA: Diagnosis not present

## 2024-03-14 DIAGNOSIS — I7100 Dissection of unspecified site of aorta: Secondary | ICD-10-CM | POA: Diagnosis not present

## 2024-03-14 DIAGNOSIS — Z781 Physical restraint status: Secondary | ICD-10-CM

## 2024-03-14 DIAGNOSIS — R112 Nausea with vomiting, unspecified: Principal | ICD-10-CM

## 2024-03-14 DIAGNOSIS — Z87440 Personal history of urinary (tract) infections: Secondary | ICD-10-CM

## 2024-03-14 DIAGNOSIS — D72829 Elevated white blood cell count, unspecified: Secondary | ICD-10-CM | POA: Diagnosis not present

## 2024-03-14 DIAGNOSIS — G4733 Obstructive sleep apnea (adult) (pediatric): Secondary | ICD-10-CM | POA: Diagnosis present

## 2024-03-14 LAB — TROPONIN I (HIGH SENSITIVITY)
Troponin I (High Sensitivity): 7 ng/L (ref <18)
Troponin I (High Sensitivity): 6 ng/L (ref <18)

## 2024-03-14 LAB — COMPREHENSIVE METABOLIC PANEL WITH GFR
ALT: 14 U/L (ref 0–44)
AST: 16 U/L (ref 15–41)
Albumin: 3 g/dL — ABNORMAL LOW (ref 3.5–5.0)
Alkaline Phosphatase: 95 U/L (ref 38–126)
Anion gap: 10 (ref 5–15)
BUN: 9 mg/dL (ref 6–20)
CO2: 28 mmol/L (ref 22–32)
Calcium: 9.3 mg/dL (ref 8.9–10.3)
Chloride: 103 mmol/L (ref 98–111)
Creatinine, Ser: 1.41 mg/dL — ABNORMAL HIGH (ref 0.44–1.00)
GFR, Estimated: 45 mL/min — ABNORMAL LOW (ref >60)
Glucose, Bld: 261 mg/dL — ABNORMAL HIGH (ref 70–99)
Potassium: 2.9 mmol/L — ABNORMAL LOW (ref 3.5–5.1)
Sodium: 141 mmol/L (ref 135–145)
Total Bilirubin: 0.5 mg/dL (ref 0.0–1.2)
Total Protein: 7.5 g/dL (ref 6.5–8.1)

## 2024-03-14 LAB — CBC WITH DIFFERENTIAL/PLATELET
Abs Immature Granulocytes: 0.15 K/uL — ABNORMAL HIGH (ref 0.00–0.07)
Basophils Absolute: 0 K/uL (ref 0.0–0.1)
Basophils Relative: 0 %
Eosinophils Absolute: 0 K/uL (ref 0.0–0.5)
Eosinophils Relative: 0 %
HCT: 32.5 % — ABNORMAL LOW (ref 36.0–46.0)
Hemoglobin: 10.1 g/dL — ABNORMAL LOW (ref 12.0–15.0)
Immature Granulocytes: 1 %
Lymphocytes Relative: 15 %
Lymphs Abs: 1.7 K/uL (ref 0.7–4.0)
MCH: 23.8 pg — ABNORMAL LOW (ref 26.0–34.0)
MCHC: 31.1 g/dL (ref 30.0–36.0)
MCV: 76.5 fL — ABNORMAL LOW (ref 80.0–100.0)
Monocytes Absolute: 1.3 K/uL — ABNORMAL HIGH (ref 0.1–1.0)
Monocytes Relative: 11 %
Neutro Abs: 8.4 K/uL — ABNORMAL HIGH (ref 1.7–7.7)
Neutrophils Relative %: 73 %
Platelets: 276 K/uL (ref 150–400)
RBC: 4.25 MIL/uL (ref 3.87–5.11)
RDW: 17.7 % — ABNORMAL HIGH (ref 11.5–15.5)
WBC: 11.6 K/uL — ABNORMAL HIGH (ref 4.0–10.5)
nRBC: 0 % (ref 0.0–0.2)

## 2024-03-14 LAB — PHOSPHORUS: Phosphorus: 2.8 mg/dL (ref 2.5–4.6)

## 2024-03-14 LAB — MRSA NEXT GEN BY PCR, NASAL
MRSA by PCR Next Gen: NOT DETECTED
MRSA by PCR Next Gen: NOT DETECTED

## 2024-03-14 LAB — GLUCOSE, CAPILLARY
Glucose-Capillary: 220 mg/dL — ABNORMAL HIGH (ref 70–99)
Glucose-Capillary: 164 mg/dL — ABNORMAL HIGH (ref 70–99)
Glucose-Capillary: 158 mg/dL — ABNORMAL HIGH (ref 70–99)

## 2024-03-14 LAB — LIPASE, BLOOD: Lipase: 30 U/L (ref 11–51)

## 2024-03-14 LAB — MAGNESIUM: Magnesium: 2 mg/dL (ref 1.7–2.4)

## 2024-03-14 MED ORDER — ORAL CARE MOUTH RINSE: 15.0000 mL | OROMUCOSAL | Status: DC | PRN

## 2024-03-14 MED ORDER — DOCUSATE SODIUM 100 MG PO CAPS
100.0000 mg | ORAL_CAPSULE | Freq: Two times a day (BID) | ORAL | Status: DC | PRN
Start: 2024-03-14 — End: 2024-03-20

## 2024-03-14 MED ORDER — METOPROLOL TARTRATE 5 MG/5ML IV SOLN
5.0000 mg | Freq: Four times a day (QID) | INTRAVENOUS | Status: DC
  Administered 2024-03-14 – 2024-03-15 (×2): 5 mg via INTRAVENOUS
  Filled 2024-03-14 (×2): qty 5

## 2024-03-14 MED ORDER — ONDANSETRON HCL 4 MG/2ML IJ SOLN
4.0000 mg | Freq: Once | INTRAMUSCULAR | Status: AC
  Administered 2024-03-14: 4 mg via INTRAVENOUS
  Filled 2024-03-14: qty 2

## 2024-03-14 MED ORDER — ESMOLOL HCL-SODIUM CHLORIDE 2000 MG/100ML IV SOLN
25.0000 ug/kg/min | INTRAVENOUS | Status: DC
  Administered 2024-03-14 (×3): 300 ug/kg/min via INTRAVENOUS
  Administered 2024-03-14: 250 ug/kg/min via INTRAVENOUS
  Administered 2024-03-14 (×2): 300 ug/kg/min via INTRAVENOUS
  Administered 2024-03-14: 25 ug/kg/min via INTRAVENOUS
  Administered 2024-03-14 (×6): 300 ug/kg/min via INTRAVENOUS
  Administered 2024-03-14: 250 ug/kg/min via INTRAVENOUS
  Administered 2024-03-14 – 2024-03-15 (×4): 300 ug/kg/min via INTRAVENOUS
  Administered 2024-03-15: 275 ug/kg/min via INTRAVENOUS
  Administered 2024-03-15 (×5): 300 ug/kg/min via INTRAVENOUS
  Administered 2024-03-15 (×2): 200 ug/kg/min via INTRAVENOUS
  Administered 2024-03-15: 275 ug/kg/min via INTRAVENOUS
  Administered 2024-03-15 (×3): 300 ug/kg/min via INTRAVENOUS
  Administered 2024-03-15: 275 ug/kg/min via INTRAVENOUS
  Administered 2024-03-15 (×2): 300 ug/kg/min via INTRAVENOUS
  Administered 2024-03-15 (×2): 200 ug/kg/min via INTRAVENOUS
  Administered 2024-03-16: 175 ug/kg/min via INTRAVENOUS
  Administered 2024-03-16: 200 ug/kg/min via INTRAVENOUS
  Administered 2024-03-16: 250 ug/kg/min via INTRAVENOUS
  Administered 2024-03-16 (×2): 225 ug/kg/min via INTRAVENOUS
  Administered 2024-03-16 (×2): 250 ug/kg/min via INTRAVENOUS
  Administered 2024-03-16: 200 ug/kg/min via INTRAVENOUS
  Administered 2024-03-16: 225 ug/kg/min via INTRAVENOUS
  Administered 2024-03-16: 250 ug/kg/min via INTRAVENOUS
  Administered 2024-03-16 (×2): 275 ug/kg/min via INTRAVENOUS
  Administered 2024-03-16: 200 ug/kg/min via INTRAVENOUS
  Administered 2024-03-16: 225 ug/kg/min via INTRAVENOUS
  Administered 2024-03-17: 150 ug/kg/min via INTRAVENOUS
  Administered 2024-03-17: 250 ug/kg/min via INTRAVENOUS
  Administered 2024-03-17 (×3): 175 ug/kg/min via INTRAVENOUS
  Administered 2024-03-17: 150 ug/kg/min via INTRAVENOUS
  Administered 2024-03-17: 200 ug/kg/min via INTRAVENOUS
  Administered 2024-03-17: 175 ug/kg/min via INTRAVENOUS
  Administered 2024-03-17: 250 ug/kg/min via INTRAVENOUS
  Administered 2024-03-17: 175 ug/kg/min via INTRAVENOUS
  Administered 2024-03-17: 200 ug/kg/min via INTRAVENOUS
  Filled 2024-03-14 (×10): qty 100
  Filled 2024-03-14: qty 500
  Filled 2024-03-14 (×14): qty 100
  Filled 2024-03-14: qty 500
  Filled 2024-03-14 (×16): qty 100
  Filled 2024-03-14: qty 500
  Filled 2024-03-14 (×19): qty 100

## 2024-03-14 MED ORDER — LABETALOL HCL 5 MG/ML IV SOLN
20.0000 mg | Freq: Once | INTRAVENOUS | Status: AC
  Administered 2024-03-14: 20 mg via INTRAVENOUS
  Filled 2024-03-14: qty 4

## 2024-03-14 MED ORDER — HEPARIN SODIUM (PORCINE) 5000 UNIT/ML IJ SOLN: 5000.0000 [IU] | Freq: Three times a day (TID) | INTRAMUSCULAR | Status: DC

## 2024-03-14 MED ORDER — FENTANYL CITRATE PF 50 MCG/ML IJ SOSY
100.0000 ug | PREFILLED_SYRINGE | Freq: Once | INTRAMUSCULAR | Status: AC
  Administered 2024-03-14: 100 ug via INTRAVENOUS
  Filled 2024-03-14: qty 2

## 2024-03-14 MED ORDER — POTASSIUM CHLORIDE 10 MEQ/100ML IV SOLN
10.0000 meq | INTRAVENOUS | Status: AC
  Administered 2024-03-14 (×4): 10 meq via INTRAVENOUS
  Filled 2024-03-14 (×3): qty 100

## 2024-03-14 MED ORDER — HYDRALAZINE HCL 20 MG/ML IJ SOLN: 20.0000 mg | Freq: Once | INTRAMUSCULAR | Status: AC

## 2024-03-14 MED ORDER — IOHEXOL 300 MG/ML  SOLN
100.0000 mL | Freq: Once | INTRAMUSCULAR | Status: AC | PRN
  Administered 2024-03-14: 100 mL via INTRAVENOUS

## 2024-03-14 MED ORDER — POLYETHYLENE GLYCOL 3350 17 G PO PACK: 17.0000 g | PACK | Freq: Every day | ORAL | Status: DC | PRN

## 2024-03-14 MED ORDER — CHLORHEXIDINE GLUCONATE CLOTH 2 % EX PADS
6.0000 | MEDICATED_PAD | Freq: Every day | CUTANEOUS | Status: DC
  Administered 2024-03-14: 6 via TOPICAL

## 2024-03-14 MED ORDER — CLEVIDIPINE BUTYRATE 0.5 MG/ML IV EMUL
0.0000 mg/h | INTRAVENOUS | Status: DC
  Administered 2024-03-14 – 2024-03-15 (×2): 2 mg/h via INTRAVENOUS
  Administered 2024-03-15: 7 mg/h via INTRAVENOUS
  Administered 2024-03-15: 9.5 mg/h via INTRAVENOUS
  Administered 2024-03-16: 2 mg/h via INTRAVENOUS
  Administered 2024-03-16: 6 mg/h via INTRAVENOUS
  Administered 2024-03-16: 2 mg/h via INTRAVENOUS
  Filled 2024-03-14 (×3): qty 50
  Filled 2024-03-14: qty 100
  Filled 2024-03-14 (×3): qty 50

## 2024-03-14 MED ORDER — ENALAPRILAT 1.25 MG/ML IV SOLN
1.2500 mg | Freq: Four times a day (QID) | INTRAVENOUS | Status: DC
  Administered 2024-03-14 – 2024-03-15 (×2): 1.25 mg via INTRAVENOUS
  Filled 2024-03-14 (×6): qty 1

## 2024-03-14 MED ORDER — HYDRALAZINE HCL 20 MG/ML IJ SOLN
10.0000 mg | INTRAMUSCULAR | Status: DC | PRN
  Administered 2024-03-15 – 2024-03-17 (×2): 20 mg via INTRAVENOUS
  Administered 2024-03-17: 10 mg via INTRAVENOUS
  Filled 2024-03-14 (×3): qty 1

## 2024-03-14 MED ORDER — CHLORHEXIDINE GLUCONATE CLOTH 2 % EX PADS
6.0000 | MEDICATED_PAD | Freq: Every day | CUTANEOUS | Status: DC
  Administered 2024-03-14 – 2024-03-16 (×2): 6 via TOPICAL

## 2024-03-14 MED ORDER — SODIUM CHLORIDE 0.9 % IV BOLUS
500.0000 mL | Freq: Once | INTRAVENOUS | Status: AC
  Administered 2024-03-14: 500 mL via INTRAVENOUS

## 2024-03-14 MED ORDER — POTASSIUM CHLORIDE 10 MEQ/100ML IV SOLN
10.0000 meq | INTRAVENOUS | Status: AC
  Filled 2024-03-14: qty 100

## 2024-03-14 MED ORDER — INSULIN ASPART 100 UNIT/ML IJ SOLN
0.0000 [IU] | INTRAMUSCULAR | Status: DC
  Administered 2024-03-14 (×2): 2 [IU] via SUBCUTANEOUS
  Administered 2024-03-14: 3 [IU] via SUBCUTANEOUS
  Administered 2024-03-15 (×3): 1 [IU] via SUBCUTANEOUS
  Administered 2024-03-15 (×2): 2 [IU] via SUBCUTANEOUS
  Administered 2024-03-16 (×3): 1 [IU] via SUBCUTANEOUS
  Administered 2024-03-16: 2 [IU] via SUBCUTANEOUS
  Administered 2024-03-17: 1 [IU] via SUBCUTANEOUS
  Administered 2024-03-17: 2 [IU] via SUBCUTANEOUS
  Administered 2024-03-17: 1 [IU] via SUBCUTANEOUS
  Administered 2024-03-17: 2 [IU] via SUBCUTANEOUS
  Administered 2024-03-17: 3 [IU] via SUBCUTANEOUS
  Administered 2024-03-18 (×5): 1 [IU] via SUBCUTANEOUS
  Administered 2024-03-19: 2 [IU] via SUBCUTANEOUS
  Administered 2024-03-19 (×2): 1 [IU] via SUBCUTANEOUS
  Administered 2024-03-19: 2 [IU] via SUBCUTANEOUS
  Filled 2024-03-14 (×12): qty 1
  Filled 2024-03-14: qty 5
  Filled 2024-03-14 (×12): qty 1

## 2024-03-14 MED ORDER — DEXMEDETOMIDINE HCL IN NACL 400 MCG/100ML IV SOLN
INTRAVENOUS | Status: AC
  Administered 2024-03-14: 0.4 ug/kg/h via INTRAVENOUS
  Filled 2024-03-14: qty 100

## 2024-03-14 MED ORDER — FLUOXETINE HCL 20 MG PO CAPS
80.0000 mg | ORAL_CAPSULE | Freq: Every day | ORAL | Status: DC
Start: 2024-03-15 — End: 2024-03-17
  Administered 2024-03-17: 80 mg via ORAL
  Filled 2024-03-14: qty 4

## 2024-03-14 MED ORDER — DEXMEDETOMIDINE HCL IN NACL 400 MCG/100ML IV SOLN
0.0000 ug/kg/h | INTRAVENOUS | Status: DC
  Administered 2024-03-14 – 2024-03-15 (×3): 0.8 ug/kg/h via INTRAVENOUS
  Administered 2024-03-15 (×2): 1 ug/kg/h via INTRAVENOUS
  Administered 2024-03-16 (×4): 0.6 ug/kg/h via INTRAVENOUS
  Administered 2024-03-17: 0.4 ug/kg/h via INTRAVENOUS
  Filled 2024-03-14 (×11): qty 100

## 2024-03-14 MED ORDER — NITROGLYCERIN IN D5W 200-5 MCG/ML-% IV SOLN: 0.0000 ug/min | INTRAVENOUS | Status: DC

## 2024-03-14 MED ORDER — NICARDIPINE HCL IN NACL 20-0.86 MG/200ML-% IV SOLN
3.0000 mg/h | INTRAVENOUS | Status: DC
  Administered 2024-03-14: 12.5 mg/h via INTRAVENOUS
  Administered 2024-03-14: 15 mg/h via INTRAVENOUS
  Administered 2024-03-14: 5 mg/h via INTRAVENOUS
  Administered 2024-03-14: 12.5 mg/h via INTRAVENOUS
  Administered 2024-03-14 (×4): 15 mg/h via INTRAVENOUS
  Filled 2024-03-14 (×10): qty 200

## 2024-03-14 MED ORDER — HYDROMORPHONE HCL 1 MG/ML IJ SOLN
1.0000 mg | INTRAMUSCULAR | Status: DC | PRN
  Administered 2024-03-14 – 2024-03-20 (×11): 1 mg via INTRAVENOUS
  Filled 2024-03-14 (×13): qty 1

## 2024-03-14 MED ORDER — ORAL CARE MOUTH RINSE
15.0000 mL | OROMUCOSAL | Status: DC
  Administered 2024-03-14 – 2024-03-19 (×23): 15 mL via OROMUCOSAL

## 2024-03-14 MED ORDER — CLONIDINE HCL 0.3 MG/24HR TD PTWK
0.3000 mg | MEDICATED_PATCH | TRANSDERMAL | Status: DC
  Administered 2024-03-14: 0.3 mg via TRANSDERMAL
  Filled 2024-03-14: qty 1

## 2024-03-14 MED ORDER — HYDROCODONE-ACETAMINOPHEN 5-325 MG PO TABS
1.0000 | ORAL_TABLET | Freq: Four times a day (QID) | ORAL | Status: DC | PRN
  Administered 2024-03-17: 1 via ORAL
  Filled 2024-03-14: qty 1

## 2024-03-14 MED ORDER — HYDRALAZINE HCL 20 MG/ML IJ SOLN
INTRAMUSCULAR | Status: AC
  Administered 2024-03-14: 20 mg via INTRAVENOUS
  Filled 2024-03-14: qty 1

## 2024-03-14 MED ORDER — POTASSIUM CHLORIDE 10 MEQ/100ML IV SOLN
10.0000 meq | INTRAVENOUS | Status: AC
  Filled 2024-03-14 (×2): qty 100

## 2024-03-14 MED ORDER — HYDROMORPHONE HCL 1 MG/ML IJ SOLN
1.0000 mg | Freq: Once | INTRAMUSCULAR | Status: AC
  Administered 2024-03-14: 1 mg via INTRAVENOUS
  Filled 2024-03-14: qty 1

## 2024-03-14 NOTE — ED Provider Notes (Signed)
 [facility] Provider Note    Event Date/Time   First MD Initiated Contact with Patient 03/14/24 [phone]     (approximate)   History   Emesis   HPI  Diamond Rice is a 53 y.o. female   Past medical history of achalasia, esophageal impactions, polysubstance use, hypertension and diabetes who presents to the emergency department with vague complaints of nausea/vomiting/chest pain and abdominal pain acute onset this evening.  She states that she has vomited several times.  She states that there is a discomfort in her chest/upper neck.  She denies GI bleeding.  She felt well earlier in the day.   External Medical Documents Reviewed: Hospitalization note from admission 1 week ago for esophageal obstruction due to food impaction, urinary tract infection, hypokalemia and hypophosphatemia secondary to GI losses, noted at that time with a history of stroke bedbound status with right hemiparesis      Physical Exam   Triage Vital Signs: ED Triage Vitals  Encounter Vitals Group     BP --      Girls Systolic BP Percentile --      Girls Diastolic BP Percentile --      Boys Systolic BP Percentile --      Boys Diastolic BP Percentile --      Pulse --      Resp --      Temp 03/14/24 0412 98.4 F (36.9 C)     Temp Source 03/14/24 0412 Oral     SpO2 --      Weight --      Height --      Head Circumference --      Peak Flow --      Pain Score 03/14/24 0411 10     Pain Loc --      Pain Education --      Exclude from Growth Chart --     Most recent vital signs: Vitals:   03/14/24 0740 03/14/24 0745  BP: (!) 124/90 (!) 129/90  Pulse: 92 91  Resp: 14 15  Temp:    SpO2: 98% 99%    General: Awake, no distress.  CV:  Good peripheral perfusion.  Resp:  Normal effort. Abd:  No distention.  Other:  Diffuse abdominal tenderness without rigidity or guarding.  Chest wall tenderness to palpation without obvious external signs of injury.  Clear lungs.   Breathing comfortably.  No hypoxemia.  Somnolent appearing but maintaining respirations and answering my questions.  Unilateral weakness consistent with prior stroke.  Radial pulses intact and equal bilateral   ED Results / Procedures / Treatments   Labs (all labs ordered are listed, but only abnormal results are displayed) Labs Reviewed  COMPREHENSIVE METABOLIC PANEL WITH GFR - Abnormal; Notable for the following components:      Result Value   Potassium 2.9 (*)    Glucose, Bld 261 (*)    Creatinine, Ser 1.41 (*)    Albumin 3.0 (*)    GFR, Estimated 45 (*)    All other components within normal limits  CBC WITH DIFFERENTIAL/PLATELET - Abnormal; Notable for the following components:   WBC 11.6 (*)    Hemoglobin 10.1 (*)    HCT 32.5 (*)    MCV 76.5 (*)    MCH 23.8 (*)    RDW 17.7 (*)    Neutro Abs 8.4 (*)    Monocytes Absolute 1.3 (*)    Abs Immature Granulocytes 0.15 (*)    All other  components within normal limits  MRSA NEXT GEN BY PCR, NASAL  LIPASE, BLOOD  MAGNESIUM   TROPONIN I (HIGH SENSITIVITY)  TROPONIN I (HIGH SENSITIVITY)     I ordered and reviewed the above labs they are notable for hypokalemia 2.9.  Creatinine elevated 1.41 compared to prior of 0.72 a week ago   EKG  ED ECG REPORT I, Ginnie Shams, the attending physician, personally viewed and interpreted this ECG.   Date: 03/14/2024  EKG Time: 0416  Rate: 69  Rhythm: sinus  Axis: nl  Intervals:nl  ST&T Change: no stemi    RADIOLOGY I independently reviewed and interpreted CT chest and see ascending aortic dissection I also reviewed radiologist's formal read.   PROCEDURES:  Critical Care performed: Yes, see critical care procedure note(s)  .Critical Care  Performed by: Shams Ginnie, MD Authorized by: Shams Ginnie, MD   Critical care provider statement:    Critical care time (minutes):  90   Critical care was time spent personally by me on the following activities:  Development of treatment plan  with patient or surrogate, discussions with consultants, evaluation of patient's response to treatment, examination of patient, ordering and review of laboratory studies, ordering and review of radiographic studies, ordering and performing treatments and interventions, pulse oximetry, re-evaluation of patient's condition and review of old charts    MEDICATIONS ORDERED IN ED: Medications  potassium chloride  10 mEq in 100 mL IVPB (has no administration in time range)  esmolol (BREVIBLOC) 2000 mg / 100 mL (20 mg/mL) infusion (300 mcg/kg/min  113 kg Intravenous Rate/Dose Change 03/14/24 0723)  nicardipine (CARDENE) 20mg  in 0.86% saline 200ml IV infusion (0.1 mg/ml) (10 mg/hr Intravenous Rate/Dose Change 03/14/24 0746)  docusate sodium  (COLACE) capsule 100 mg (has no administration in time range)  polyethylene glycol (MIRALAX  / GLYCOLAX ) packet 17 g (has no administration in time range)  heparin  injection 5,000 Units (has no administration in time range)  ondansetron  (ZOFRAN ) injection 4 mg (4 mg Intravenous Given 03/14/24 0454)  sodium chloride  0.9 % bolus 500 mL (0 mLs Intravenous Stopped 03/14/24 0703)  iohexol  (OMNIPAQUE ) 300 MG/ML solution 100 mL (100 mLs Intravenous Contrast Given 03/14/24 0534)  labetalol  (NORMODYNE ) injection 20 mg (20 mg Intravenous Given 03/14/24 0552)  fentaNYL  (SUBLIMAZE ) injection 100 mcg (100 mcg Intravenous Given 03/14/24 0555)  labetalol  (NORMODYNE ) injection 20 mg (20 mg Intravenous Given 03/14/24 0708)  fentaNYL  (SUBLIMAZE ) injection 100 mcg (100 mcg Intravenous Given 03/14/24 0657)  ondansetron  (ZOFRAN ) injection 4 mg (4 mg Intravenous Given 03/14/24 0656)  HYDROmorphone  (DILAUDID ) injection 1 mg (1 mg Intravenous Given 03/14/24 9271)    External physician / consultants:  I spoke with [facility] cardiothoracic surgery Dr. Coletta regarding care plan for this patient.   IMPRESSION / MDM / ASSESSMENT AND PLAN / ED COURSE  I reviewed the triage vital signs and the nursing  notes.                                Patient's presentation is most consistent with acute presentation with potential threat to life or bodily function.  Differential diagnosis includes, but is not limited to, food impaction, ACS, aortic syndrome, intra-abdominal infection or obstruction, mesenteric ischemia, dehydration or metabolic derangements   The patient is on the cardiac monitor to evaluate for evidence of arrhythmia and/or significant heart rate changes.  MDM:    Constellation of symptoms in this patient with esophageal disease and recent food impaction hospitalizations, here with  nausea vomiting chest abdomen pain and evidence of hypokalemia and AKI.  Will give repletion and fluids.  Will check troponins and EKG for ACS.  Get CT scan of the chest abdomen pelvis for intra-abdominal infections, obstruction, food impactions.   It looks like she is has a type a dissection on my read, confirmed with radiologist, vascular surgeon cannot do type a dissection here cardiothoracic surgery paged at [facility] for transfer.  Immediate impulse control ordered with labetalol  push as esmolol drip and nicardipine drip being prepared.  IV fentanyl  for pain control.   --  I spoke with cardiothoracic surgery at [facility] and had a conversation on speaker phone with the patient, Dr. Coletta at [facility] cardiothoracic surgery, and the patient's next of kin Sister Ms. McFarland on the phone.  During the conversation the cardiothoracic surgeon detailed treatment options including surgery, the risks of surgery, potential complications of surgery, and together decided that surgery was not the option for this patient and decision was to forego surgery and focus on comfort care with maximal medical management including impulse control.  The patient and sister are understanding that without surgery she is very likely to die.  I spoke with the patient and she expresses understanding of her situation treatment options, as does the  sister.  The patient has elected to change her CODE STATUS to DNR/DNI and stay at [facility] for medical management and comfort care.  She understands that at [facility] surgery is not an option for her.  Her sister next of kin Ms. Alona on the phone agrees on this plan.  I spoke with Dr. Jama of vascular surgery regarding the change in care plan to focus on comfort care and medical management here at [facility].  I spoke with ICU consultant for admission.  The patient was started on esmolol and has had very good effect with blood pressures now in the 120s systolic but heart rate remains in the 70s to 80s.  Will continue to increase the esmolol for goal of  systolic blood pressure 100-120 and a heart rate below 60.  Fortunately toward the change of shift morning ICU team will pursue central access to be able to add on nicardipine for dual therapy impulse control.  The patient was given IV narcotics with good effect for pain control.  Patient admitted to ICU.      FINAL CLINICAL IMPRESSION(S) / ED DIAGNOSES   Final diagnoses:  Nausea and vomiting, unspecified vomiting type  Hypokalemia  AKI (acute kidney injury) (HCC)  Type A aortic dissection (HCC)     Rx / DC Orders   ED Discharge Orders     None        Note:  This document was prepared using Dragon voice recognition software and may include unintentional dictation errors.    Cyrena Mylar, MD 03/14/24 [phone]

## 2024-03-14 NOTE — Procedures (Signed)
 Arterial Catheter Insertion Procedure Note  [name]  [id number]  [birth year]  Date:03/14/24  Time:12:11 PM    Provider Performing: [name] JONETTA Lecher    Procedure: Insertion of Arterial Line (63379) with US  guidance (23062)   Indication(s) Blood pressure monitoring and/or need for frequent ABGs  Consent Risks of the procedure as well as the alternatives and risks of each were explained to the patient and/or caregiver.  Consent for the procedure was obtained and is signed in the bedside chart  Anesthesia None   Time Out Verified patient identification, verified procedure, site/side was marked, verified correct patient position, special equipment/implants available, medications/allergies/relevant history reviewed, required imaging and test results available.   Sterile Technique Maximal sterile technique including full sterile barrier drape, hand hygiene, sterile gown, sterile gloves, mask, hair covering, sterile ultrasound probe cover (if used).   Procedure Description Area of catheter insertion was cleaned with chlorhexidine  and draped in sterile fashion. With real-time ultrasound guidance an arterial catheter was placed into the left radial artery.  Appropriate arterial tracings confirmed on monitor.     Complications/Tolerance None; patient tolerated the procedure well.   EBL Minimal   Specimen(s) None    [name] Lecher, AGACNP-BC  Pulmonary & Critical Care Prefer epic messenger for cross cover needs If after hours, please call E-link

## 2024-03-14 NOTE — Progress Notes (Signed)
 Order received from provider to place female external catheter.

## 2024-03-14 NOTE — ED Triage Notes (Addendum)
 Pt. Arrives via [facility] from [facility] C/O nausea and emesis after attempting to eat. Also endorses mid upper chest pain starting en route. Hx of esophageal narrowing that required stretching. Per EMS, she has called out several times for the same issue.

## 2024-03-14 NOTE — IPAL (Signed)
  Interdisciplinary Goals of Care Family Meeting   Date carried out: 03/14/2024  Location of the meeting: Bedside  Member's involved: Physician, Nurse Practitioner, and Bedside Registered Nurse, Dr. Cyrena Rower Power of Attorney or acting medical decision maker: Pt is awake and alert, oriented x4, able to make decisions for herself    Discussion: We discussed goals of care for [facility] .  Reviewed clinical findings including Type A Aortic Dissection, of which per prior discussion with Dr. Cyrena, pt and her sister, and [facility] Cardiothoracic Surgeon Dr. Coletta, pt deemed to be a very poor surgical candidate, and recommended medical management knowing there is a high risk of death.  Patient understands her grim prognosis even with medical management of BP and HR control.  Discussed that if the Dissection ruptures, she would pass away despite attempts at CPR/ACLS and intubation.    Pt is in agreement with changing Code Status to DNR/DNI.  She would like to continue to treat the treatable and allow time for outcomes.  However should she decline further with impending rupture, she would want to shift to Comfort Measures at that time and allow for a natural death.  Code status:   Code Status: Limited: Do not attempt resuscitation (DNR) -DNR-LIMITED -Do Not Intubate/DNI    Disposition: Continue current acute care  Time spent for the meeting: 15 minutes   [name] Lecher, AGACNP-BC  Pulmonary & Critical Care Prefer epic messenger for cross cover needs If after hours, please call E-link  [name] JONETTA Lecher, NP  03/14/2024, 7:40 AM

## 2024-03-14 NOTE — Consult Note (Signed)
 Hospital Consult    Reason for Consult:  Aortic Dissection Requesting Physician:  Dr Ginnie Shams MD  MRN #:  [id number]  History of Present Illness: This is a 53 y.o. female with a past medical history of achalasia, esophageal impactions, polysubstance use, hypertension and diabetes who presents to the emergency department with vague complaints of nausea/vomiting/chest pain and abdominal pain acute onset this evening. She complained of discomfort in her chest and endorses she has vomited several times. In reviewing her chart she was admitted a week ago for esophageal obstruction due to food impaction.   Upon work up patient underwent CTA of the chest, abdomen and pelvis. Patient was noted to have a Debakey Type 1 Aortic Dissection. See the full report below. Vascular Surgery was consulted to evaluate.    Past Medical History:  Diagnosis Date   Acute ischemic stroke (HCC) 12/27/2020   Acute renal failure (ARF) (HCC) 04/02/2018   Anxiety    Chronic lower back pain 04/17/2012   just got over some; catched when I walked   COPD (chronic obstructive pulmonary disease) (HCC)    Critical lower limb ischemia (HCC) 05/27/2018   Depression    Headache(784.0) 04/17/2012   ~ qod; lately waking up in am w/one   High cholesterol    Hypertension    Migraines 04/17/2012   Obesity    Sleep apnea    CPAP   Stroke (HCC)    Type II diabetes mellitus (HCC) 08/28/2002    Past Surgical History:  Procedure Laterality Date   ABDOMINAL AORTOGRAM W/LOWER EXTREMITY Left 05/27/2018   Procedure: ABDOMINAL AORTOGRAM W/LOWER EXTREMITY Runoff and Possible Intervention;  Surgeon: Sheree Penne Bruckner, MD;  Location: [facility] INVASIVE CV LAB;  Service: Cardiovascular;  Laterality: Left;   APPLICATION OF WOUND VAC Left 06/06/2018   Procedure: APPLICATION OF WOUND VAC;  Surgeon: Sheree Penne Bruckner, MD;  Location: [facility] OR;  Service: Vascular;  Laterality: Left;   BALLOON DILATION N/A 12/31/2020   Procedure: MERRILL HODGKIN;  Surgeon: Saintclair Jasper, MD;  Location: [facility] ENDOSCOPY;  Service: Gastroenterology;  Laterality: N/A;   BIOPSY  04/04/2018   Procedure: BIOPSY;  Surgeon: Charlanne Groom, MD;  Location: [facility] ENDOSCOPY;  Service: Endoscopy;;   BIOPSY  10/27/2020   Procedure: BIOPSY;  Surgeon: Burnette Fallow, MD;  Location: [facility] ENDOSCOPY;  Service: Endoscopy;;   BOTOX  INJECTION N/A 12/31/2020   Procedure: BOTOX  INJECTION;  Surgeon: Saintclair Jasper, MD;  Location: [facility] ENDOSCOPY;  Service: Gastroenterology;  Laterality: N/A;   CARDIAC CATHETERIZATION  ~ 2011   COLONOSCOPY N/A 04/04/2018   Procedure: COLONOSCOPY;  Surgeon: Charlanne Groom, MD;  Location: [facility] ENDOSCOPY;  Service: Endoscopy;  Laterality: N/A;   COLONOSCOPY WITH PROPOFOL  N/A 10/27/2020   Procedure: COLONOSCOPY WITH PROPOFOL ;  Surgeon: Burnette Fallow, MD;  Location: [facility] ENDOSCOPY;  Service: Endoscopy;  Laterality: N/A;   ESOPHAGOGASTRODUODENOSCOPY N/A 12/31/2020   Procedure: ESOPHAGOGASTRODUODENOSCOPY (EGD);  Surgeon: Saintclair Jasper, MD;  Location: [facility] ENDOSCOPY;  Service: Gastroenterology;  Laterality: N/A;   ESOPHAGOGASTRODUODENOSCOPY N/A 03/05/2024   Procedure: EGD (ESOPHAGOGASTRODUODENOSCOPY);  Surgeon: Jinny Carmine, MD;  Location: [facility] ENDOSCOPY;  Service: Endoscopy;  Laterality: N/A;   ESOPHAGOGASTRODUODENOSCOPY (EGD) WITH PROPOFOL  N/A 10/27/2020   Procedure: ESOPHAGOGASTRODUODENOSCOPY (EGD) WITH PROPOFOL   WITH POSSIBLE DIL;  Surgeon: Burnette Fallow, MD;  Location: [facility] ENDOSCOPY;  Service: Endoscopy;  Laterality: N/A;   ESOPHAGOGASTRODUODENOSCOPY (EGD) WITH PROPOFOL  N/A 10/19/2021   Procedure: ESOPHAGOGASTRODUODENOSCOPY (EGD) WITH PROPOFOL ;  Surgeon: Unk Corinn Skiff, MD;  Location: [facility] ENDOSCOPY;  Service: Gastroenterology;  Laterality: N/A;  ESOPHAGOGASTRODUODENOSCOPY (EGD) WITH PROPOFOL  N/A 06/02/2022   Procedure: ESOPHAGOGASTRODUODENOSCOPY (EGD) WITH PROPOFOL ;  Surgeon: Therisa Bi, MD;  Location: [facility] ENDOSCOPY;  Service: Gastroenterology;  Laterality: N/A;    FOREIGN BODY REMOVAL  12/31/2020   Procedure: FOREIGN BODY REMOVAL;  Surgeon: Saintclair Jasper, MD;  Location: [facility] ENDOSCOPY;  Service: Gastroenterology;;   LOWER EXTREMITY ANGIOGRAM Left 05/27/2018   PERIPHERAL VASCULAR INTERVENTION Left 05/27/2018   Procedure: PERIPHERAL VASCULAR INTERVENTION;  Surgeon: Sheree Penne Bruckner, MD;  Location: [facility] INVASIVE CV LAB;  Service: Cardiovascular;  Laterality: Left;   POLYPECTOMY  04/04/2018   Procedure: POLYPECTOMY;  Surgeon: Charlanne Groom, MD;  Location: [facility] ENDOSCOPY;  Service: Endoscopy;;   POLYPECTOMY  10/27/2020   Procedure: POLYPECTOMY;  Surgeon: Burnette Fallow, MD;  Location: [facility] ENDOSCOPY;  Service: Endoscopy;;   TRANSMETATARSAL AMPUTATION Left 05/28/2018   Procedure: AMPUTATION TOES THREE, FOUR AND FIVE on left foot;  Surgeon: Sheree Penne Bruckner, MD;  Location: [facility] OR;  Service: Vascular;  Laterality: Left;   WOUND DEBRIDEMENT Left 06/06/2018   Procedure: DEBRIDEMENT WOUND LEFT FOOT;  Surgeon: Sheree Penne Bruckner, MD;  Location: [facility] OR;  Service: Vascular;  Laterality: Left;    Allergies  Allergen Reactions   Morphine  And Codeine Hives   Metformin  Diarrhea and Other (See Comments)    Allergic, per [facility]    Prior to Admission medications   Medication Sig Start Date End Date Taking? Authorizing Provider  acetaminophen  (TYLENOL ) 500 MG tablet Take 500 mg by mouth 3 (three) times daily.    [provider]  aspirin  EC 81 MG EC tablet Take 1 tablet (81 mg total) by mouth daily. 09/21/18   Noemi Reena CROME, NP  atorvastatin  (LIPITOR ) 40 MG tablet Take 2 tablets (80 mg total) by mouth daily at 6 PM. 01/03/21   Hongalgi, Anand D, MD  baclofen  (LIORESAL ) 10 MG tablet Take 5 mg by mouth 3 (three) times daily.    [provider]  bisacodyl  (DULCOLAX) 10 MG suppository Place 10 mg rectally as needed for moderate constipation.    [provider]  carvedilol  (COREG ) 25 MG tablet Take 25 mg by mouth 2 (two) times daily with a meal.     [provider]  cloNIDine  (CATAPRES  - DOSED IN MG/24 HR) 0.3 mg/24hr patch 0.3 mg once a week. 11/08/21   [provider]  famotidine (PEPCID) 40 MG tablet Take 40 mg by mouth daily. 12/15/21   [provider]  feeding supplement (ENSURE ENLIVE / ENSURE PLUS) LIQD Take 237 mLs by mouth 2 (two) times daily between meals. Patient not taking: Reported on 09/20/2023 06/05/22   Caleen Qualia, MD  ferrous sulfate  220 (44 Fe) MG/5ML solution Take 7 mLs by mouth every Monday, Wednesday, and Friday.    [provider]  FLUoxetine  (PROZAC ) 40 MG capsule Take 80 mg by mouth daily. 12/26/21   [provider]  GVOKE HYPOPEN  1-PACK 1 MG/0.2ML SOAJ Inject 1 mg into the skin as needed. 03/03/24   [provider]  HUMULIN R  100 UNIT/ML injection Inject 0-9 Units into the skin 3 (three) times daily before meals. 12/25/23   [provider]  LANTUS  SOLOSTAR 100 UNIT/ML Solostar Pen Inject 15 Units into the skin at bedtime. 03/07/23   [provider]  lidocaine  (LMX) 4 % cream Apply 1 Application topically daily.    [provider]  magnesium  hydroxide (MILK OF MAGNESIA) 400 MG/5ML suspension Take by mouth daily as needed for mild constipation.    [provider]  melatonin 5 MG TABS Take 5 mg by mouth at bedtime.    [provider]  nitroGLYCERIN  (NITROSTAT ) 0.4 MG SL tablet Place 0.4 mg under the tongue every 5 (five) minutes as needed for chest pain.    [provider]  oxyCODONE  (ROXICODONE ) 5 MG/5ML solution Take 5 mLs by mouth every 8 (eight) hours as needed for moderate pain (pain score 4-6) or severe pain (pain score 7-10).    [provider]  pantoprazole  sodium (PROTONIX ) 40 mg Take 40 mg by mouth daily.    [provider]  polyethylene glycol (MIRALAX  / GLYCOLAX ) 17 g packet Take 17 g by mouth every Monday, Wednesday, and Friday.    [provider]  Potassium Chloride  10 % SOLN Take  15 mLs by mouth daily.    [provider]  promethazine  (PHENERGAN ) 25 MG/ML injection Inject 25 mg into the muscle as needed for nausea or vomiting. 05/25/22   [provider]  Semaglutide, 1 MG/DOSE, 2 MG/1.5ML SOPN Inject 2 mg into the skin every Wednesday. 03/12/23   [provider]  senna (SENOKOT) 8.6 MG tablet Take 1 tablet by mouth daily.    [provider]  traZODone  (DESYREL ) 50 MG tablet Take 25 mg by mouth at bedtime.    [provider]    Social History   Socioeconomic History   Marital status: Single    Spouse name: Not on file   Number of children: 0   Years of education: Not on file   Highest education level: Not on file  Occupational History   Occupation: disabled  Tobacco Use   Smoking status: Every Day    Current packs/day: 0.25    Average packs/day: 0.3 packs/day for 28.0 years (7.0 ttl pk-yrs)    Types: Cigarettes   Smokeless tobacco: Never  Vaping Use   Vaping status: Never Used  Substance and Sexual Activity   Alcohol use: No    Alcohol/week: 0.0 standard drinks of alcohol    Comment: rare   Drug use: Yes    Types: Marijuana, Crack cocaine    Comment: no drugs for past year   Sexual activity: Not Currently    Birth control/protection: None  Other Topics Concern   Not on file  Social History Narrative   Was a  CNA at [employer]. Lives with godmother.   03/03/21 residing at [facility] since 01/03/21     Social Drivers of Health   Financial Resource Strain: Medium Risk (08/02/2023)   Received from [facility]   Overall Financial Resource Strain (CARDIA)    Difficulty of Paying Living Expenses: Somewhat hard  Food Insecurity: No Food Insecurity (03/05/2024)   Hunger Vital Sign    Worried About Running Out of Food in the Last Year: Never true    Ran Out of Food in the Last Year: Never true  Transportation Needs: No Transportation Needs (03/05/2024)   PRAPARE - Scientist, research (physical sciences) (Medical): No    Lack of Transportation (Non-Medical): No  Physical Activity: Not on file  Stress: Not on file  Social Connections: Not on file  Intimate Partner Violence: Not At Risk (03/05/2024)   Humiliation, Afraid, Rape, and Kick questionnaire    Fear of Current or Ex-Partner: No    Emotionally Abused: No    Physically Abused: No    Sexually Abused: No     Family History  Problem Relation Age of Onset   Stroke Brother 70  Stroke Brother 50   Heart attack Mother 56   Stroke Father 23    ROS: Otherwise negative unless mentioned in HPI  Physical Examination  Vitals:   03/14/24 0740 03/14/24 0745  BP: (!) 124/90 (!) 129/90  Pulse: 92 91  Resp: 14 15  Temp:    SpO2: 98% 99%   There is no height or weight on file to calculate BMI.  General:  WDWN in NAD Gait: Not observed HENT: WNL, normocephalic Pulmonary: normal non-labored breathing, without Rales, rhonchi,  wheezing Cardiac: irregular, PVC's with intermittent tachycardia. , without  Murmurs, rubs or gallops; without carotid bruits Abdomen: Morbidly obese, positive bowel sounds, soft, NT/ND, no masses Skin: without rashes Vascular Exam/Pulses: Positive radial and ulnar pulses bilaterally.  Extremities: without ischemic changes, without Gangrene , without cellulitis; without open wounds;  Musculoskeletal: no muscle wasting or atrophy  Neurologic: A&O X 3;  No focal weakness or paresthesias are detected; speech is fluent/normal Psychiatric:  The pt has Normal affect. Lymph:  Unremarkable  CBC    Component Value Date/Time   WBC 11.6 (H) 03/14/2024 0418   RBC 4.25 03/14/2024 0418   HGB 10.1 (L) 03/14/2024 0418   HCT 32.5 (L) 03/14/2024 0418   PLT 276 03/14/2024 0418   MCV 76.5 (L) 03/14/2024 0418   MCH 23.8 (L) 03/14/2024 0418   MCHC 31.1 03/14/2024 0418   RDW 17.7 (H) 03/14/2024 0418   LYMPHSABS 1.7 03/14/2024 0418   MONOABS 1.3 (H) 03/14/2024 0418   EOSABS 0.0 03/14/2024 0418   BASOSABS  0.0 03/14/2024 0418    BMET    Component Value Date/Time   NA 141 03/14/2024 0418   NA 141 12/17/2019 0000   K 2.9 (L) 03/14/2024 0418   CL 103 03/14/2024 0418   CO2 28 03/14/2024 0418   GLUCOSE 261 (H) 03/14/2024 0418   BUN 9 03/14/2024 0418   BUN 14 12/17/2019 0000   CREATININE 1.41 (H) 03/14/2024 0418   CREATININE 1.81 (H) 05/23/2018 1620   CALCIUM  9.3 03/14/2024 0418   GFRNONAA 45 (L) 03/14/2024 0418   GFRNONAA 54 (L) 09/28/2014 1034   GFRAA >60 02/17/2020 1940   GFRAA 62 09/28/2014 1034    COAGS: Lab Results  Component Value Date   INR 0.9 01/16/2021   INR 1.0 12/27/2020   INR 1.0 12/06/2019     Non-Invasive Vascular Imaging:   EXAM: CT CHEST, ABDOMEN AND PELVIS WITH CONTRAST 03/14/2024 05:41:52 AM   TECHNIQUE: CT of the chest, abdomen and pelvis was performed with the administration of intravenous contrast. Multiplanar reformatted images are provided for review. Automated exposure control, iterative reconstruction, and/or weight based adjustment of the mA/kV was utilized to reduce the radiation dose to as low as reasonably achievable.   COMPARISON: CT AP 11/19/2022 and CT angio chest 07/29/2021.   CLINICAL HISTORY: Multiple complaints, poor historian-history of achalasia and food impaction here with nausea chest pain upper abdominal pain, please scan from neck down to chest check for food impaction, as well as abdomen pelvis given diffuse tenderness. Table formatting from the original note was not included.; Images from the original note were not included.; Per ed notes; Pt. Arrives via [facility] from [place] C/O nausea and emesis after attempting to eat. Hx of esophageal narrowing that required stretching. Per EMS, she has called out several times for the same issue.   FINDINGS:   CHEST:   MEDIASTINUM: Heart size is upper limits of normal. No pericardial effusion. There is a dilated and patulous esophagus  which may be related to chronic  dysmotility. The central airways are grossly patent.   THORACIC LYMPH NODES: No mediastinal, hilar or axillary lymphadenopathy.   LUNGS AND PLEURA: The pulmonary parenchymal detail is diminished due to motion artifact. No pleural fluid, interstitial edema or pneumothorax. No suspicious nodule or mass identified.   ABDOMEN AND PELVIS:   LIVER: The liver is unremarkable.   GALLBLADDER AND BILE DUCTS: Gallbladder is unremarkable. No biliary ductal dilatation.   SPLEEN: No acute abnormality.   PANCREAS: No acute abnormality.   ADRENAL GLANDS: No acute abnormality.   KIDNEYS, URETERS AND BLADDER: There is complete hypoenhancement of the upper pole of left kidney with only a small amount of enhancement involving the anterior cortex of the interpolar left kidney and inferior pole of the left kidney. Right Kidney appears well perfused and enhances normally. No stones in the kidneys or ureters. No hydronephrosis. No perinephric or periureteral stranding. Urinary bladder is unremarkable.   GI AND BOWEL: Stomach demonstrates no acute abnormality. There is no bowel obstruction.   PERITONEUM AND RETROPERITONEUM: No ascites. No free air.   VASCULATURE: There is a dissection flap originating at the base of the proximal ascending thoracic aorta and extending to below the level of the renal arteries and proximal to the aortic bifurcation. No dissection flap identified extending into the arch vessels. There is absent contrast opacification within the false lumen distal to the posterior arch. The unopacified false lumen is greater than the opacified true lumen. According to the DeBakey classification, the finding is consistent with DeBakey type I aortic dissection. Aortic atherosclerotic calcification and coronary artery calcification. The ascending thoracic aorta measures 4 cm. The dissection flap extends into the iliac arteries. The distal branches of the celiac artery appear  patent and well opacified. The superior mesenteric artery arises off the true lumen and is patent and well opacified. Right renal artery arises off the true lumen. The left renal artery arises off the false lumen with absent contrast opacification. Atherosclerotic calcifications are noted involving the abdominal aorta as well as the iliac vasculature. No aneurysmal dilatation of the abdominal aorta.   ABDOMINAL AND PELVIS LYMPH NODES: No lymphadenopathy.   REPRODUCTIVE ORGANS: There is a fibroid uterus which appears similar to previous exam. Exophytic fibroid arising off the left side of the uterine fundus measures 4.1 cm, unchanged.   BONES AND SOFT TISSUES: Lumbar degenerative disc disease. No acute osseous abnormality. No focal soft tissue abnormality.   IMPRESSION: 1. DeBakey type I aortic dissection with absent contrast opacification within the false lumen distal to the posterior arch, greater than the opacified true lumen. No dissection flap identified extending into the arch vessels. 2. Nonocclusive Dissection flap extends into the proximal celiac artery 3. The left renal artery appears to arise off the false lumen with absent contrast opacification signs of left renal infarct involving the entire upper pole and portions of the inferior pole. 4. Dilated and patulous esophagus, possibly related to chronic dysmotility. 5. Mild aneurysmal dilatation of the ascending thoracic aorta measuring 4 cm. 6. Aortic atherosclerosis and coronary artery calcifications 7. Critical findings were called to the ordering provider Dr. Cyrena at the time of interpretation 03/14/24 at 6:55 am  Statin:  Yes.   Beta Blocker:  Yes.   Aspirin :  Yes.   ACEI:  No. ARB:  No. CCB use:  No Other antiplatelets/anticoagulants:  No.    ASSESSMENT/PLAN: This is a 53 y.o. female who presents to [facility] emergency department with vague complaints of  nausea, vomiting, chest pain and abdominal pain that was  acute onset.  She also complained of discomfort in her chest and endorsed that she vomited several times.  She was admitted to the hospital a week ago for esophageal obstruction due to food impaction.  Upon workup she underwent a CTA of the chest abdomen and pelvis.  She was noted to have a DeBakey type I aortic dissection.  I reviewed the CT scan with Dr. Cordella Shawl MD.  We do not treat DeBakey type I aortic dissections here at [facility].  We recommend that the patient be transferred to [facility] vascular surgery department/program who can handle any surgical intervention the patient may need.  I was informed that the emergency room physician contacted [facility] for transfer.  [facility] declined due to the patient's extreme medical history and comorbidities.  [facility] feels at this time she is not a surgical candidate and any surgical intervention at this time would be extremely high risk for death.  The emergency room physician also had the patient's sister on the phone and there was a discussion between them concerning the patient's care.  After this discussion the patient was made DNR/DNI but we would treat medically at this time.  The ICU team was contacted for admission.  Vascular surgery recommends at this time the there be strict blood pressure control.  Patient's systolic blood pressure should be between 100 and 120.  Heart rate should be 60-70.  The ICU team was able to get central access through her right groin and the patient was started on esmolol drip to help control blood pressure in the emergency room.  Both the patient and the family understand medical condition she is facing at this time.  They verbalized her understanding that if the patient starts to decompensate they would move to comfort care.  At this time there is no intervention planned by vascular surgery.  Please feel free to reach out for any of the recommendations related to medical treatment going forward.   -I discussed the case in detail with Dr.  Cordella Shawl MD and he agrees with the plan   [facility] 03/14/2024 7:49 AM

## 2024-03-14 NOTE — Procedures (Signed)
 Central Venous Catheter Insertion Procedure Note  Diamond Rice  [id number]  07/22/1971  Date:03/14/24  Time:8:29 AM   Provider Performing:Autie Vasudevan D Shellia   Procedure: Insertion of Non-tunneled Central Venous [phone]) with US  guidance ([id number])   Indication(s) Medication administration and Difficult access  Consent Risks of the procedure as well as the alternatives and risks of each were explained to the patient and/or caregiver.  Consent for the procedure was obtained and is signed in the bedside chart  Anesthesia Topical only with 1% lidocaine    Timeout Verified patient identification, verified procedure, site/side was marked, verified correct patient position, special equipment/implants available, medications/allergies/relevant history reviewed, required imaging and test results available.  Sterile Technique Maximal sterile technique including full sterile barrier drape, hand hygiene, sterile gown, sterile gloves, mask, hair covering, sterile ultrasound probe cover (if used).  Procedure Description Area of catheter insertion was cleaned with chlorhexidine  and draped in sterile fashion.  With real-time ultrasound guidance a central venous catheter was placed into the right femoral vein. Nonpulsatile blood flow and easy flushing noted in all ports.  The catheter was sutured in place and sterile dressing applied.  Complications/Tolerance None; patient tolerated the procedure well. Chest X-ray is ordered to verify placement for internal jugular or subclavian cannulation.   Chest x-ray is not ordered for femoral cannulation.  EBL Minimal  Specimen(s) None     Line inserted to the 20 cm mark.    [name] Shellia, AGACNP-BC [facility] Prefer epic messenger for cross cover needs If after hours, please call E-link

## 2024-03-14 NOTE — ED Notes (Signed)
 Called CCMD to place patient on monitor

## 2024-03-14 NOTE — ED Notes (Signed)
 Due to central line placement, patient unable to keep right arm straight for IV Esmolol and Cardene to infuse without occluding.

## 2024-03-14 NOTE — Progress Notes (Signed)
 Arterial line placed by NP once verification of right patient was completed along with consent.  Patient tolerated procedure well.

## 2024-03-14 NOTE — ED Notes (Signed)
 Multiple attempts were made for a second peripheral line at this time. All attempts were unsuccessful. Cyrena, MD at bedside and attempting and ultrasound line.

## 2024-03-14 NOTE — Progress Notes (Signed)
 Verbal order received for hydralazine  20mg  IV.

## 2024-03-14 NOTE — H&P (Signed)
 NAME:  Rethel Sebek, MRN:  [id number], DOB:  September 23, 1970, LOS: 0 /ADMISSION DATE:  03/14/2024, CONSULTATION DATE:  03/14/2024 REFERRING MD:  Dr. Cyrena, CHIEF COMPLAINT:  Chest pain   Brief Pt Description / Synopsis:  53 y.o. female admitted with Type A Aortic Dissection, deemed to be very poor surgical candidate for surgical repair.   History of Present Illness:  Purva Vessell is a 53 y.o. female  with a past medical history of achalasia, esophageal impactions, polysubstance use, stroke with right hemiparesis (bed bound), hypertension, COPD, diabetes mellitus, and morbid obesity who presents from her skilled nursing facility to the emergency department on 03/14/2024 with complaints of acute onset nausea/vomiting/chest pain and abdominal pain.  She reported she had vomited several times with associated discomfort in her chest/abdomen/upper neck.  She denies dizziness, palpitations, shortness of breath, diarrhea, melena, hematochezia, dysuria, fever or chills.  Of note she was recently admitted at [facility] from 03/05/2024 through 03/06/2024 with Esophageal Obstruction due to food impaction and E. Coli UTI.   ED Course: Initial Vital Signs: Temperature 98.4 F orally, respiratory rate 18, pulse 70, blood pressure 157/61, SpO2 98% on room air Significant Labs: Potassium 2.9, glucose 261, BUN 9, creatinine 1.4, WBC 11.6, hemoglobin 10.1 Imaging CT Chest/Abdomen/Pelvis w contrast>>IMPRESSION: 1. DeBakey type I aortic dissection with absent contrast opacification within the false lumen distal to the posterior arch, greater than the opacified true lumen. No dissection flap identified extending into the arch vessels. 2. Nonocclusive Dissection flap extends into the proximal celiac artery 3. The left renal artery appears to arise off the false lumen with absent contrast opacification signs of left renal infarct involving the entire upper pole and portions of the inferior pole. 4. Dilated and patulous  esophagus, possibly related to chronic dysmotility. 5. Mild aneurysmal dilatation of the ascending thoracic aorta measuring 4 cm. 6. Aortic atherosclerosis and coronary artery calcifications Medications Administered: 20 mg hydralazine , IV Dilaudid ,, IV Zofran  ,esmolol drip initiated  ED provider Dr. Cyrena discussed with Dr. Jama of vascular surgery, and recommended transfer to tertiary center as unable to opt for surgical options here at [facility] for type a dissection.  Telephone conference was held with Dr. Josepha, the patient, patient's sister, and Dr. Coletta of [facility] cardiothoracic surgery.  She was deemed to be a very poor surgical candidate with [facility]'s recommendation for medical medical management including impulse control and considering focusing just on comfort.  The patient and family agreed for admission here at [facility] for medical management understanding very high risk of mortality.  PCCM asked to admit for further workup and treatment.  Please see Significant Hospital Events section below for full detailed hospital course.   Pertinent  Medical History   Past Medical History:  Diagnosis Date   Acute ischemic stroke (HCC) 12/27/2020   Acute renal failure (ARF) (HCC) 04/02/2018   Anxiety    Chronic lower back pain 04/17/2012   just got over some; catched when I walked   COPD (chronic obstructive pulmonary disease) (HCC)    Critical lower limb ischemia (HCC) 05/27/2018   Depression    Headache(784.0) 04/17/2012   ~ qod; lately waking up in am w/one   High cholesterol    Hypertension    Migraines 04/17/2012   Obesity    Sleep apnea    CPAP   Stroke (HCC)    Type II diabetes mellitus (HCC) 08/28/2002    Micro Data:  7/18: MRSA PCR>>negative  Antimicrobials:   Anti-infectives (From admission, onward)  None       Significant Hospital Events: Including procedures, antibiotic start and stop dates in addition to other pertinent events   7/18: Found to have Type A Aortic  Dissection, Vascular Surgery consulted, recommended transfer to tertiary care center as unable to offer surgical intervention here at [facility]  Deemed to be very poor surgical candidate by [facility] Cardiothoracic Surgery.  Recommends medically treating and allow time for outcomes knowing high risk for mortality.  PCCM asked to admit, code Status changed to DNR/DNI.  Interim History / Subjective:  As outlined above under Significant Hospital Events section  Objective   Blood pressure (!) 136/90, pulse 92, temperature 98.4 F (36.9 C), temperature source Oral, resp. rate 14, last menstrual period 04/26/2018, SpO2 100%.        Intake/Output Summary (Last 24 hours) at 03/14/2024 0740 Last data filed at 03/14/2024 0703 Gross per 24 hour  Intake 500 ml  Output --  Net 500 ml   There were no vitals filed for this visit.  Examination: General: Acute on chronically ill-appearing obese female, laying in bed, in no acute distress HENT: Atraumatic, normal cephalic, neck supple, no JVD Lungs: Clear breath sounds, even, nonlabored Cardiovascular: Regular rate and rhythm, S1-S2, murmurs, rubs, gallops Abdomen: Obese, soft, tender to palpation, no guarding or rebound tenderness, bowel sounds positive x 4 Extremities: Generalized weakness (right-sided hemiparesis at baseline from previous stroke), no cyanosis, warm extremities Neuro: Awake and alert, oriented x 4 (intermittently lethargic following IV pushes of narcotics), moves all extremities commands, pupils PERRLA GU: Deferred  Resolved Hospital Problem list     Assessment & Plan:   #Type A Aortic Dissection PMHx: HTN, HLD, stroke with right hemiparesis -Continuous cardiac monitoring -Maintain HR <60 & SBP 100-120 -Esmolol and Nicardipine drips as needed  -Prn Hydralazine  -Pain control -May need to consider Precedex if needed -Vascular Surgery consulted, appreciate input -ED Provider Dr. Cyrena and pt/family discussed with [facility] Cardiothoracic  Surgeon Dr. Coletta via telephone  ~ deemed to be VERY POOR SURGICAL CANDIDATE ~ recommended medical management  #COPD without acute exacerbation PMHx: OSA, morbid obesity -Supplemental O2 as needed to maintain O2 sats 88 to 92% -Follow intermittent Chest X-ray & ABG as needed -Bronchodilators prn -Pulmonary toilet as able  #Acute Kidney Injury #Hypokalemia -Monitor I&O's / urinary output -Follow BMP -Ensure adequate renal perfusion -Avoid nephrotoxic agents as able -Replace electrolytes as indicated ~ Pharmacy following for assistance with electrolyte replacement -IV Fluids  #Anemia -Monitor for S/Sx of bleeding -Trend CBC -SCD's for VTE Prophylaxis  -Transfuse for Hgb <7  #Mild Leukocytosis, likely reactive PMHx: recent E. Coli UTI 03/05/24 -Monitor fever curve -Trend WBC's  -Follow cultures as above -Will hold off on empiric ABX as this time pending cultures & sensitivities  #Diabetes Mellitus Type II -CBG's q4h; Target range of 140 to 180 -SSI -Follow ICU Hypo/Hyperglycemia protocol       Pt is critically ill with multiorgan failure. Prognosis is guarded, high risk for further decompensation, cardiac arrest, and death.  Given current critical illness superimposed on multiple chronic co-morbidities and advanced age, overall long term prognosis is poor.  Pt is DNR/DNI status.  Depending on clinical course, may need to consult Palliative Care to assist with GOC discussions.   Best Practice (right click and Reselect all SmartList Selections daily)   Diet/type: Regular consistency (see orders) DVT prophylaxis: SCD GI prophylaxis: N/A Lines: Central line and yes and it is still needed Foley:  N/A Code Status:  DNR Last date  of multidisciplinary goals of care discussion [03/14/24]  7/18: Pt and family updated at bedside on plan of care.  Labs   CBC: Recent Labs  Lab 03/14/24 0418  WBC 11.6*  NEUTROABS 8.4*  HGB 10.1*  HCT 32.5*  MCV 76.5*  PLT 276     Basic Metabolic Panel: Recent Labs  Lab 03/14/24 0418  NA 141  K 2.9*  CL 103  CO2 28  GLUCOSE 261*  BUN 9  CREATININE 1.41*  CALCIUM  9.3  MG 2.0   GFR: Estimated Creatinine Clearance: 55.1 mL/min (A) (by C-G formula based on SCr of 1.41 mg/dL (H)). Recent Labs  Lab 03/14/24 0418  WBC 11.6*    Liver Function Tests: Recent Labs  Lab 03/14/24 0418  AST 16  ALT 14  ALKPHOS 95  BILITOT 0.5  PROT 7.5  ALBUMIN 3.0*   Recent Labs  Lab 03/14/24 0418  LIPASE 30   No results for input(s): AMMONIA in the last 168 hours.  ABG    Component Value Date/Time   PHART 7.487 (H) 12/06/2019 1802   PCO2ART 32.6 12/06/2019 1802   PO2ART 69.0 (L) 12/06/2019 1802   HCO3 24.7 12/06/2019 1802   TCO2 28 12/27/2020 1755   O2SAT 95.0 12/06/2019 1802     Coagulation Profile: No results for input(s): INR, PROTIME in the last 168 hours.  Cardiac Enzymes: No results for input(s): CKTOTAL, CKMB, CKMBINDEX, TROPONINI in the last 168 hours.  HbA1C: Hgb A1c MFr Bld  Date/Time Value Ref Range Status  03/04/2024 10:37 PM 8.6 (H) 4.8 - 5.6 % Final    Comment:    (NOTE)         Prediabetes: 5.7 - 6.4         Diabetes: >6.4         Glycemic control for adults with diabetes: <7.0   06/01/2022 05:05 AM 7.1 (H) 4.8 - 5.6 % Final    Comment:    (NOTE) Pre diabetes:          5.7%-6.4%  Diabetes:              >6.4%  Glycemic control for   <7.0% adults with diabetes     CBG: No results for input(s): GLUCAP in the last 168 hours.  Review of Systems:   Positives in BOLD: Gen: Denies fever, chills, weight change, fatigue, night sweats HEENT: Denies blurred vision, double vision, hearing loss, tinnitus, sinus congestion, rhinorrhea, sore throat, neck stiffness, dysphagia PULM: Denies shortness of breath, cough, sputum production, hemoptysis, wheezing CV: Denies chest pain, edema, orthopnea, paroxysmal nocturnal dyspnea, palpitations GI: Denies abdominal pain,  nausea, vomiting, diarrhea, hematochezia, melena, constipation, change in bowel habits GU: Denies dysuria, hematuria, polyuria, oliguria, urethral discharge Endocrine: Denies hot or cold intolerance, polyuria, polyphagia or appetite change Derm: Denies rash, dry skin, scaling or peeling skin change Heme: Denies easy bruising, bleeding, bleeding gums Neuro: Denies headache, numbness, weakness, slurred speech, loss of memory or consciousness   Past Medical History:  She,  has a past medical history of Acute ischemic stroke (HCC) (12/27/2020), Acute renal failure (ARF) (HCC) (04/02/2018), Anxiety, Chronic lower back pain (04/17/2012), COPD (chronic obstructive pulmonary disease) (HCC), Critical lower limb ischemia (HCC) (05/27/2018), Depression, Headache(784.0) (04/17/2012), High cholesterol, Hypertension, Migraines (04/17/2012), Obesity, Sleep apnea, Stroke (HCC), and Type II diabetes mellitus (HCC) (08/28/2002).   Surgical History:   Past Surgical History:  Procedure Laterality Date   ABDOMINAL AORTOGRAM W/LOWER EXTREMITY Left 05/27/2018   Procedure: ABDOMINAL AORTOGRAM W/LOWER EXTREMITY Runoff and  Possible Intervention;  Surgeon: Sheree Penne Bruckner, MD;  Location: [facility] INVASIVE CV LAB;  Service: Cardiovascular;  Laterality: Left;   APPLICATION OF WOUND VAC Left 06/06/2018   Procedure: APPLICATION OF WOUND VAC;  Surgeon: Sheree Penne Bruckner, MD;  Location: [facility] OR;  Service: Vascular;  Laterality: Left;   BALLOON DILATION N/A 12/31/2020   Procedure: MERRILL HODGKIN;  Surgeon: Saintclair Jasper, MD;  Location: [facility] ENDOSCOPY;  Service: Gastroenterology;  Laterality: N/A;   BIOPSY  04/04/2018   Procedure: BIOPSY;  Surgeon: Charlanne Groom, MD;  Location: [facility] ENDOSCOPY;  Service: Endoscopy;;   BIOPSY  10/27/2020   Procedure: BIOPSY;  Surgeon: Burnette Fallow, MD;  Location: [facility] ENDOSCOPY;  Service: Endoscopy;;   BOTOX  INJECTION N/A 12/31/2020   Procedure: BOTOX  INJECTION;  Surgeon: Saintclair Jasper, MD;  Location: [facility]  ENDOSCOPY;  Service: Gastroenterology;  Laterality: N/A;   CARDIAC CATHETERIZATION  ~ 2011   COLONOSCOPY N/A 04/04/2018   Procedure: COLONOSCOPY;  Surgeon: Charlanne Groom, MD;  Location: [facility] ENDOSCOPY;  Service: Endoscopy;  Laterality: N/A;   COLONOSCOPY WITH PROPOFOL  N/A 10/27/2020   Procedure: COLONOSCOPY WITH PROPOFOL ;  Surgeon: Burnette Fallow, MD;  Location: [facility] ENDOSCOPY;  Service: Endoscopy;  Laterality: N/A;   ESOPHAGOGASTRODUODENOSCOPY N/A 12/31/2020   Procedure: ESOPHAGOGASTRODUODENOSCOPY (EGD);  Surgeon: Saintclair Jasper, MD;  Location: [facility] ENDOSCOPY;  Service: Gastroenterology;  Laterality: N/A;   ESOPHAGOGASTRODUODENOSCOPY N/A 03/05/2024   Procedure: EGD (ESOPHAGOGASTRODUODENOSCOPY);  Surgeon: Jinny Carmine, MD;  Location: [facility] ENDOSCOPY;  Service: Endoscopy;  Laterality: N/A;   ESOPHAGOGASTRODUODENOSCOPY (EGD) WITH PROPOFOL  N/A 10/27/2020   Procedure: ESOPHAGOGASTRODUODENOSCOPY (EGD) WITH PROPOFOL   WITH POSSIBLE DIL;  Surgeon: Burnette Fallow, MD;  Location: [facility] ENDOSCOPY;  Service: Endoscopy;  Laterality: N/A;   ESOPHAGOGASTRODUODENOSCOPY (EGD) WITH PROPOFOL  N/A 10/19/2021   Procedure: ESOPHAGOGASTRODUODENOSCOPY (EGD) WITH PROPOFOL ;  Surgeon: Unk Corinn Skiff, MD;  Location: [facility] ENDOSCOPY;  Service: Gastroenterology;  Laterality: N/A;   ESOPHAGOGASTRODUODENOSCOPY (EGD) WITH PROPOFOL  N/A 06/02/2022   Procedure: ESOPHAGOGASTRODUODENOSCOPY (EGD) WITH PROPOFOL ;  Surgeon: Therisa Bi, MD;  Location: [facility] ENDOSCOPY;  Service: Gastroenterology;  Laterality: N/A;   FOREIGN BODY REMOVAL  12/31/2020   Procedure: FOREIGN BODY REMOVAL;  Surgeon: Saintclair Jasper, MD;  Location: [facility] ENDOSCOPY;  Service: Gastroenterology;;   LOWER EXTREMITY ANGIOGRAM Left 05/27/2018   PERIPHERAL VASCULAR INTERVENTION Left 05/27/2018   Procedure: PERIPHERAL VASCULAR INTERVENTION;  Surgeon: Sheree Penne Bruckner, MD;  Location: [facility] INVASIVE CV LAB;  Service: Cardiovascular;  Laterality: Left;   POLYPECTOMY  04/04/2018   Procedure:  POLYPECTOMY;  Surgeon: Charlanne Groom, MD;  Location: [facility] ENDOSCOPY;  Service: Endoscopy;;   POLYPECTOMY  10/27/2020   Procedure: POLYPECTOMY;  Surgeon: Burnette Fallow, MD;  Location: [facility] ENDOSCOPY;  Service: Endoscopy;;   TRANSMETATARSAL AMPUTATION Left 05/28/2018   Procedure: AMPUTATION TOES THREE, FOUR AND FIVE on left foot;  Surgeon: Sheree Penne Bruckner, MD;  Location: [facility] OR;  Service: Vascular;  Laterality: Left;   WOUND DEBRIDEMENT Left 06/06/2018   Procedure: DEBRIDEMENT WOUND LEFT FOOT;  Surgeon: Sheree Penne Bruckner, MD;  Location: [facility] OR;  Service: Vascular;  Laterality: Left;     Social History:   reports that she has been smoking cigarettes. She has a 7 pack-year smoking history. She has never used smokeless tobacco. She reports current drug use. Drugs: Marijuana and Crack cocaine. She reports that she does not drink alcohol.   Family History:  Her family history includes Heart attack (age of onset: 31) in her mother; Stroke (age of onset: 36) in her brother and brother; Stroke (age of onset: 45) in her  father.   Allergies Allergies  Allergen Reactions   Morphine  And Codeine Hives   Metformin  Diarrhea and Other (See Comments)    Allergic, per [facility]     Home Medications  Prior to Admission medications   Medication Sig Start Date End Date Taking? Authorizing Provider  acetaminophen  (TYLENOL ) 500 MG tablet Take 500 mg by mouth 3 (three) times daily.    [provider]  aspirin  EC 81 MG EC tablet Take 1 tablet (81 mg total) by mouth daily. 09/21/18   Noemi Reena CROME, NP  atorvastatin  (LIPITOR ) 40 MG tablet Take 2 tablets (80 mg total) by mouth daily at 6 PM. 01/03/21   Hongalgi, Anand D, MD  baclofen  (LIORESAL ) 10 MG tablet Take 5 mg by mouth 3 (three) times daily.    [provider]  bisacodyl  (DULCOLAX) 10 MG suppository Place 10 mg rectally as needed for moderate constipation.    [provider]  carvedilol  (COREG ) 25 MG tablet Take 25 mg by  mouth 2 (two) times daily with a meal.    [provider]  cloNIDine  (CATAPRES  - DOSED IN MG/24 HR) 0.3 mg/24hr patch 0.3 mg once a week. 11/08/21   [provider]  famotidine (PEPCID) 40 MG tablet Take 40 mg by mouth daily. 12/15/21   [provider]  feeding supplement (ENSURE ENLIVE / ENSURE PLUS) LIQD Take 237 mLs by mouth 2 (two) times daily between meals. Patient not taking: Reported on 09/20/2023 06/05/22   Caleen Qualia, MD  ferrous sulfate  220 (44 Fe) MG/5ML solution Take 7 mLs by mouth every Monday, Wednesday, and Friday.    [provider]  FLUoxetine  (PROZAC ) 40 MG capsule Take 80 mg by mouth daily. 12/26/21   [provider]  GVOKE HYPOPEN  1-PACK 1 MG/0.2ML SOAJ Inject 1 mg into the skin as needed. 03/03/24   [provider]  HUMULIN R  100 UNIT/ML injection Inject 0-9 Units into the skin 3 (three) times daily before meals. 12/25/23   [provider]  LANTUS  SOLOSTAR 100 UNIT/ML Solostar Pen Inject 15 Units into the skin at bedtime. 03/07/23   [provider]  lidocaine  (LMX) 4 % cream Apply 1 Application topically daily.    [provider]  magnesium  hydroxide (MILK OF MAGNESIA) 400 MG/5ML suspension Take by mouth daily as needed for mild constipation.    [provider]  melatonin 5 MG TABS Take 5 mg by mouth at bedtime.    [provider]  nitroGLYCERIN  (NITROSTAT ) 0.4 MG SL tablet Place 0.4 mg under the tongue every 5 (five) minutes as needed for chest pain.    [provider]  oxyCODONE  (ROXICODONE ) 5 MG/5ML solution Take 5 mLs by mouth every 8 (eight) hours as needed for moderate pain (pain score 4-6) or severe pain (pain score 7-10).    [provider]  pantoprazole  sodium (PROTONIX ) 40 mg Take 40 mg by mouth daily.    [provider]  polyethylene glycol (MIRALAX  / GLYCOLAX ) 17 g packet Take 17 g by mouth every Monday, Wednesday, and Friday.    [provider]  Potassium Chloride  10 % SOLN Take 15 mLs by mouth daily.    [provider]  promethazine  (PHENERGAN ) 25 MG/ML injection Inject 25 mg into the muscle as needed for nausea or vomiting. 05/25/22   [provider]  Semaglutide, 1 MG/DOSE, 2 MG/1.5ML SOPN Inject 2 mg into the skin every Wednesday. 03/12/23   [provider]  senna (SENOKOT) 8.6 MG  tablet Take 1 tablet by mouth daily.    [provider]  traZODone  (DESYREL ) 50 MG tablet Take 25 mg by mouth at bedtime.    [provider]     Critical care time: 55 minutes     [name] Lecher, AGACNP-BC [facility] Prefer epic messenger for cross cover needs If after hours, please call E-link

## 2024-03-14 NOTE — ED Notes (Signed)
 [name], NP at bedside having discussion with patient about code status; patient agreeable to DNR status and comfort care.

## 2024-03-14 NOTE — Progress Notes (Signed)
 Per provider change systolic B/P goal to less than 100 for Esmolol and Nicardipine gtt.

## 2024-03-14 NOTE — ED Notes (Signed)
 Cyrena, MD was unsuccessful in getting an ultrasound line at this time. Per MD, ICU will come down and talk with patient about a central line.

## 2024-03-14 NOTE — ED Notes (Signed)
 [name], NP at bedside attempting central line

## 2024-03-14 NOTE — ED Notes (Signed)
 Called [facility] per Dr.Wong spoke with Rosaline for transfer/Cardio Thoracic  Surg services needed /Dr. Cyrena spoke with Horris Pock  facesheet faxed and films powered shared

## 2024-03-14 NOTE — Progress Notes (Signed)
 PHARMACY CONSULT NOTE - FOLLOW UP  Pharmacy Consult for Electrolyte Monitoring and Replacement   Recent Labs: Potassium (mmol/L)  Date Value  03/14/2024 2.9 (L)   Magnesium  (mg/dL)  Date Value  92/81/7974 2.0   Calcium  (mg/dL)  Date Value  92/81/7974 9.3   Albumin (g/dL)  Date Value  92/81/7974 3.0 (L)   Phosphorus (mg/dL)  Date Value  92/81/7974 2.8   Sodium  Date Value  03/14/2024 141 mmol/L  12/17/2019 141     Assessment: 53 y.o. female with medical history significant for CVA (with residual left sided deficits), dysarthria, COPD, DM2, HTN, obesity, PE, , achalasia s/p pneumatic dilation (06/16/22 and 7/30/4), food impaction 07/2023 s/p EGD at [facility] who presented with Type 1 Aortic Dissection. Pharmacy is asked to follow and replace electrolytes while in CCU   Goal of Therapy:  Electrolytes WNL  Plan:  --10 mEq IV KCl x 5 --recheck electrolytes   Diamond Rice ,PharmD Clinical Pharmacist 03/14/2024 8:37 AM

## 2024-03-14 NOTE — Plan of Care (Signed)
 Continuing with plan of care.

## 2024-03-15 LAB — RENAL FUNCTION PANEL
Albumin: 2.5 g/dL — ABNORMAL LOW (ref 3.5–5.0)
Anion gap: 5 (ref 5–15)
BUN: 15 mg/dL (ref 6–20)
CO2: 23 mmol/L (ref 22–32)
Calcium: 8.6 mg/dL — ABNORMAL LOW (ref 8.9–10.3)
Chloride: 112 mmol/L — ABNORMAL HIGH (ref 98–111)
Creatinine, Ser: 2.22 mg/dL — ABNORMAL HIGH (ref 0.44–1.00)
GFR, Estimated: 26 mL/min — ABNORMAL LOW (ref >60)
Glucose, Bld: 121 mg/dL — ABNORMAL HIGH (ref 70–99)
Phosphorus: 3.4 mg/dL (ref 2.5–4.6)
Potassium: 4.4 mmol/L (ref 3.5–5.1)
Sodium: 140 mmol/L (ref 135–145)

## 2024-03-15 LAB — MAGNESIUM: Magnesium: 1.9 mg/dL (ref 1.7–2.4)

## 2024-03-15 LAB — GLUCOSE, CAPILLARY
Glucose-Capillary: 114 mg/dL — ABNORMAL HIGH (ref 70–99)
Glucose-Capillary: 128 mg/dL — ABNORMAL HIGH (ref 70–99)
Glucose-Capillary: 131 mg/dL — ABNORMAL HIGH (ref 70–99)
Glucose-Capillary: 136 mg/dL — ABNORMAL HIGH (ref 70–99)
Glucose-Capillary: 147 mg/dL — ABNORMAL HIGH (ref 70–99)
Glucose-Capillary: 176 mg/dL — ABNORMAL HIGH (ref 70–99)
Glucose-Capillary: 182 mg/dL — ABNORMAL HIGH (ref 70–99)

## 2024-03-15 LAB — CBC
HCT: 30.5 % — ABNORMAL LOW (ref 36.0–46.0)
Hemoglobin: 9.6 g/dL — ABNORMAL LOW (ref 12.0–15.0)
MCH: 24.2 pg — ABNORMAL LOW (ref 26.0–34.0)
MCHC: 31.5 g/dL (ref 30.0–36.0)
MCV: 76.8 fL — ABNORMAL LOW (ref 80.0–100.0)
Platelets: 225 K/uL (ref 150–400)
RBC: 3.97 MIL/uL (ref 3.87–5.11)
RDW: 18.6 % — ABNORMAL HIGH (ref 11.5–15.5)
WBC: 10.7 K/uL — ABNORMAL HIGH (ref 4.0–10.5)
nRBC: 0 % (ref 0.0–0.2)

## 2024-03-15 NOTE — Care Management Important Message (Signed)
 Important Message  Patient Details  Name: Diamond Rice MRN: [id number] Date of Birth: 14-Mar-1971   Important Message Given:  Yes - Medicare IM     Diamond Rice 03/15/2024, 2:06 PM

## 2024-03-15 NOTE — Progress Notes (Signed)
 Attempted to contact pts sister Diamond Rice via telephone to discuss goals of treatment, however she did not answer the telephone.  Left HIPAA appropriate voicemail instructing her to return my phone call.  Lonell Moose, AGNP  Pulmonary/Critical Care Pager [phone] (please enter 7 digits) PCCM Consult Pager [phone] (please enter 7 digits)

## 2024-03-15 NOTE — Plan of Care (Signed)
 Continuing with plan of care.

## 2024-03-15 NOTE — Progress Notes (Signed)
 PHARMACY CONSULT NOTE  Pharmacy Consult for Electrolyte Monitoring and Replacement   Recent Labs: Potassium (mmol/L)  Date Value  03/15/2024 4.4   Magnesium  (mg/dL)  Date Value  92/80/7974 1.9   Calcium  (mg/dL)  Date Value  92/80/7974 8.6 (L)   Albumin (g/dL)  Date Value  92/80/7974 2.5 (L)   Phosphorus (mg/dL)  Date Value  92/80/7974 3.4   Sodium  Date Value  03/15/2024 140 mmol/L  12/17/2019 141   Assessment: 53 y.o. female with medical history significant for CVA (with residual left sided deficits), dysarthria, COPD, DM2, HTN, obesity, PE, , achalasia s/p pneumatic dilation (06/16/22 and 7/30/4), food impaction 07/2023 s/p EGD at [facility] who presented with Type 1 Aortic Dissection. Pharmacy is asked to follow and replace electrolytes while in CCU   Goal of Therapy:  Electrolytes WNL  Plan:  --Hypokalemia has resolved and there is no electrolyte replacement indicated today --Re-check electrolytes tomorrow AM  Diamond Rice 03/15/2024 7:18 AM

## 2024-03-15 NOTE — Progress Notes (Signed)
 During this shift palliative care was consulted who reached out and spoke to the patient's brother regarding plan of care; per report brother is to speak with other siblings and will follow up with how to move forward.

## 2024-03-15 NOTE — Plan of Care (Signed)
 Palliative consult received.  Ms. Ditullio unable to participate in GOC conversations at this time, developed agitation yesterday evening and required initiation of Precedex  infusion. She is able to respond to verbal stimulus, can provider thumbs up or nod her head appropriate to simple questions, easily drifts back to sleep without constant redirection.  Collaborated with Lonell Nelson-CCM NP, spoke with brother-[name] via speaker phone, provided medical updates and recommendations due to high risk of mortality to consider transition to full comfort measures. According to Lytle, there are two other living brother but they reside in a facility as Rodrick is a step sister. [name] wishes to speak with Lenice prior to making decision to proceed with transition to comfort-awaiting call back.  Vor Etta Martin visited at bedside later in the shift, refers to herself as Momma but clarifies she is Ms. Chuong' godmother. She is unaware of completed Advanced Directive documents. Ms. Gladis was unaware of any living siblings. Ms. Gladis shares throughout the many years of being involved in Ms. Kawabata' life she has not had contact or communication with her siblings.  Recommend continuing Precedex  to manage possible terminal agitation given the nature of her diagnosis and high risk of mortality.  PMT to follow-up tomorrow and full consult note to follow once GOC conversations had with NOK/surrogate decision maker.  No Charge.  Waddell Lesches, DNP, AGNP-C Palliative Medicine  Please call Palliative Medicine team phone with any questions [phone]. For individual providers please see AMION.

## 2024-03-15 NOTE — Progress Notes (Signed)
 NAME:  Diamond Rice, MRN:  [id number], DOB:  05-23-1971, LOS: 1 /ADMISSION DATE:  03/14/2024, CONSULTATION DATE:  03/14/2024 REFERRING MD:  Dr. Cyrena, CHIEF COMPLAINT:  Chest pain   Brief Pt Description / Synopsis:  53 y.o. female admitted with Type A Aortic Dissection, deemed to be very poor surgical candidate for surgical repair.   History of Present Illness:  Diamond Rice is a 53 y.o. female  with a past medical history of achalasia, esophageal impactions, polysubstance use, stroke with right hemiparesis (bed bound), hypertension, COPD, diabetes mellitus, and morbid obesity who presents from her skilled nursing facility to the emergency department on 03/14/2024 with complaints of acute onset nausea/vomiting/chest pain and abdominal pain.  She reported she had vomited several times with associated discomfort in her chest/abdomen/upper neck.  She denies dizziness, palpitations, shortness of breath, diarrhea, melena, hematochezia, dysuria, fever or chills.  Of note she was recently admitted at [facility] from 03/05/2024 through 03/06/2024 with Esophageal Obstruction due to food impaction and E. Coli UTI.   ED Course: Initial Vital Signs: Temperature 98.4 F orally, respiratory rate 18, pulse 70, blood pressure 157/61, SpO2 98% on room air Significant Labs: Potassium 2.9, glucose 261, BUN 9, creatinine 1.4, WBC 11.6, hemoglobin 10.1 Imaging CT Chest/Abdomen/Pelvis w contrast>>IMPRESSION: 1. DeBakey type I aortic dissection with absent contrast opacification within the false lumen distal to the posterior arch, greater than the opacified true lumen. No dissection flap identified extending into the arch vessels. 2. Nonocclusive Dissection flap extends into the proximal celiac artery 3. The left renal artery appears to arise off the false lumen with absent contrast opacification signs of left renal infarct involving the entire upper pole and portions of the inferior pole. 4. Dilated and patulous  esophagus, possibly related to chronic dysmotility. 5. Mild aneurysmal dilatation of the ascending thoracic aorta measuring 4 cm. 6. Aortic atherosclerosis and coronary artery calcifications Medications Administered: 20 mg hydralazine , IV Dilaudid ,, IV Zofran  ,esmolol  drip initiated  ED provider Dr. Cyrena discussed with Dr. Jama of vascular surgery, and recommended transfer to tertiary center as unable to opt for surgical options here at [facility] for type a dissection.  Telephone conference was held with Dr. Josepha, the patient, patient's sister, and Dr. Coletta of [facility] cardiothoracic surgery.  She was deemed to be a very poor surgical candidate with [facility]'s recommendation for medical medical management including impulse control and considering focusing just on comfort.  The patient and family agreed for admission here at [facility] for medical management understanding very high risk of mortality.  PCCM asked to admit for further workup and treatment.  Please see Significant Hospital Events section below for full detailed hospital course.  03/15/24- patient with AKI today on ACE inhibitor which we will dc.  Her BP is better controlled this am.  We have palliative care following now due to non surgical cancidate with severe aortic dissection.   Pertinent  Medical History   Past Medical History:  Diagnosis Date   Acute ischemic stroke (HCC) 12/27/2020   Acute renal failure (ARF) (HCC) 04/02/2018   Anxiety    Chronic lower back pain 04/17/2012   just got over some; catched when I walked   COPD (chronic obstructive pulmonary disease) (HCC)    Critical lower limb ischemia (HCC) 05/27/2018   Depression    Headache(784.0) 04/17/2012   ~ qod; lately waking up in am w/one   High cholesterol    Hypertension    Migraines 04/17/2012   Obesity    Sleep  apnea    CPAP   Stroke (HCC)    Type II diabetes mellitus (HCC) 08/28/2002    Micro Data:  7/18: MRSA PCR>>negative  Antimicrobials:   Anti-infectives  (From admission, onward)    None       Significant Hospital Events: Including procedures, antibiotic start and stop dates in addition to other pertinent events   7/18: Found to have Type A Aortic Dissection, Vascular Surgery consulted, recommended transfer to tertiary care center as unable to offer surgical intervention here at [facility].  Deemed to be very poor surgical candidate by [facility] Cardiothoracic Surgery.  Recommends medically treating and allow time for outcomes knowing high risk for mortality.  PCCM asked to admit, code Status changed to DNR/DNI.  Interim History / Subjective:  As outlined above under Significant Hospital Events section  Objective   Blood pressure 91/63, pulse 87, temperature 98 F (36.7 C), temperature source Axillary, resp. rate 18, height 5' 1 (1.549 m), weight 118.1 kg, last menstrual period 04/26/2018, SpO2 99%.        Intake/Output Summary (Last 24 hours) at 03/15/2024 1035 Last data filed at 03/15/2024 0800 Gross per 24 hour  Intake 4326.99 ml  Output --  Net 4326.99 ml   Filed Weights   03/14/24 1110  Weight: 118.1 kg    Examination: General: Acute on chronically ill-appearing obese female, laying in bed, in no acute distress HENT: Atraumatic, normal cephalic, neck supple, no JVD Lungs: Clear breath sounds, even, nonlabored Cardiovascular: Regular rate and rhythm, S1-S2, murmurs, rubs, gallops Abdomen: Obese, soft, tender to palpation, no guarding or rebound tenderness, bowel sounds positive x 4 Extremities: Generalized weakness (right-sided hemiparesis at baseline from previous stroke), no cyanosis, warm extremities Neuro: Awake and alert, oriented x 4 (intermittently lethargic following IV pushes of narcotics), moves all extremities commands, pupils PERRLA GU: Deferred  Resolved Hospital Problem list     Assessment & Plan:   #Type A Aortic Dissection PMHx: HTN, HLD, stroke with right hemiparesis -Continuous cardiac monitoring -Maintain  HR <60 & SBP 100-120 -Esmolol  and Nicardipine  drips as needed  -Prn Hydralazine  -Pain control -May need to consider Precedex  if needed -Vascular Surgery consulted, appreciate input -ED Provider Dr. Cyrena and pt/family discussed with [facility] Cardiothoracic Surgeon Dr. Coletta via telephone  ~ deemed to be VERY POOR SURGICAL CANDIDATE ~ recommended medical management  #COPD without acute exacerbation PMHx: OSA, morbid obesity -Supplemental O2 as needed to maintain O2 sats 88 to 92% -Follow intermittent Chest X-ray & ABG as needed -Bronchodilators prn -Pulmonary toilet as able  #Acute Kidney Injury #Hypokalemia -Monitor I&O's / urinary output -Follow BMP -Ensure adequate renal perfusion -Avoid nephrotoxic agents as able -Replace electrolytes as indicated ~ Pharmacy following for assistance with electrolyte replacement -IV Fluids  #Anemia -Monitor for S/Sx of bleeding -Trend CBC -SCD's for VTE Prophylaxis  -Transfuse for Hgb <7  #Mild Leukocytosis, likely reactive PMHx: recent E. Coli UTI 03/05/24 -Monitor fever curve -Trend WBC's  -Follow cultures as above -Will hold off on empiric ABX as this time pending cultures & sensitivities  #Diabetes Mellitus Type II -CBG's q4h; Target range of 140 to 180 -SSI -Follow ICU Hypo/Hyperglycemia protocol       Pt is critically ill with multiorgan failure. Prognosis is guarded, high risk for further decompensation, cardiac arrest, and death.  Given current critical illness superimposed on multiple chronic co-morbidities and advanced age, overall long term prognosis is poor.  Pt is DNR/DNI status.  Depending on clinical course, may need to consult Palliative Care  to assist with GOC discussions.   Best Practice (right click and Reselect all SmartList Selections daily)   Diet/type: Regular consistency (see orders) DVT prophylaxis: SCD GI prophylaxis: N/A Lines: Central line and yes and it is still needed Foley:  N/A Code Status:   DNR Last date of multidisciplinary goals of care discussion [03/14/24]    Labs   CBC: Recent Labs  Lab 03/14/24 0418 03/15/24 0626  WBC 11.6* 10.7*  NEUTROABS 8.4*  --   HGB 10.1* 9.6*  HCT 32.5* 30.5*  MCV 76.5* 76.8*  PLT 276 225    Basic Metabolic Panel: Recent Labs  Lab 03/14/24 0418 03/15/24 0626  NA 141 140  K 2.9* 4.4  CL 103 112*  CO2 28 23  GLUCOSE 261* 121*  BUN 9 15  CREATININE 1.41* 2.22*  CALCIUM  9.3 8.6*  MG 2.0 1.9  PHOS 2.8 3.4   GFR: Estimated Creatinine Clearance: 35.9 mL/min (A) (by C-G formula based on SCr of 2.22 mg/dL (H)). Recent Labs  Lab 03/14/24 0418 03/15/24 0626  WBC 11.6* 10.7*    Liver Function Tests: Recent Labs  Lab 03/14/24 0418 03/15/24 0626  AST 16  --   ALT 14  --   ALKPHOS 95  --   BILITOT 0.5  --   PROT 7.5  --   ALBUMIN 3.0* 2.5*   Recent Labs  Lab 03/14/24 0418  LIPASE 30   No results for input(s): AMMONIA in the last 168 hours.  ABG    Component Value Date/Time   PHART 7.487 (H) 12/06/2019 1802   PCO2ART 32.6 12/06/2019 1802   PO2ART 69.0 (L) 12/06/2019 1802   HCO3 24.7 12/06/2019 1802   TCO2 28 12/27/2020 1755   O2SAT 95.0 12/06/2019 1802     Coagulation Profile: No results for input(s): INR, PROTIME in the last 168 hours.  Cardiac Enzymes: No results for input(s): CKTOTAL, CKMB, CKMBINDEX, TROPONINI in the last 168 hours.  HbA1C: Hgb A1c MFr Bld  Date/Time Value Ref Range Status  03/04/2024 10:37 PM 8.6 (H) 4.8 - 5.6 % Final    Comment:    (NOTE)         Prediabetes: 5.7 - 6.4         Diabetes: >6.4         Glycemic control for adults with diabetes: <7.0   06/01/2022 05:05 AM 7.1 (H) 4.8 - 5.6 % Final    Comment:    (NOTE) Pre diabetes:          5.7%-6.4%  Diabetes:              >6.4%  Glycemic control for   <7.0% adults with diabetes     CBG: Recent Labs  Lab 03/14/24 1640 03/14/24 1936 03/15/24 0112 03/15/24 0325 03/15/24 0725  GLUCAP 164* 158* 114*  131* 136*    Review of Systems:   Positives in BOLD: Gen: Denies fever, chills, weight change, fatigue, night sweats HEENT: Denies blurred vision, double vision, hearing loss, tinnitus, sinus congestion, rhinorrhea, sore throat, neck stiffness, dysphagia PULM: Denies shortness of breath, cough, sputum production, hemoptysis, wheezing CV: Denies chest pain, edema, orthopnea, paroxysmal nocturnal dyspnea, palpitations GI: Denies abdominal pain, nausea, vomiting, diarrhea, hematochezia, melena, constipation, change in bowel habits GU: Denies dysuria, hematuria, polyuria, oliguria, urethral discharge Endocrine: Denies hot or cold intolerance, polyuria, polyphagia or appetite change Derm: Denies rash, dry skin, scaling or peeling skin change Heme: Denies easy bruising, bleeding, bleeding gums Neuro: Denies headache, numbness, weakness, slurred speech,  loss of memory or consciousness   Past Medical History:  She,  has a past medical history of Acute ischemic stroke (HCC) (12/27/2020), Acute renal failure (ARF) (HCC) (04/02/2018), Anxiety, Chronic lower back pain (04/17/2012), COPD (chronic obstructive pulmonary disease) (HCC), Critical lower limb ischemia (HCC) (05/27/2018), Depression, Headache(784.0) (04/17/2012), High cholesterol, Hypertension, Migraines (04/17/2012), Obesity, Sleep apnea, Stroke (HCC), and Type II diabetes mellitus (HCC) (08/28/2002).   Surgical History:   Past Surgical History:  Procedure Laterality Date   ABDOMINAL AORTOGRAM W/LOWER EXTREMITY Left 05/27/2018   Procedure: ABDOMINAL AORTOGRAM W/LOWER EXTREMITY Runoff and Possible Intervention;  Surgeon: Sheree Penne Bruckner, MD;  Location: [facility] INVASIVE CV LAB;  Service: Cardiovascular;  Laterality: Left;   APPLICATION OF WOUND VAC Left 06/06/2018   Procedure: APPLICATION OF WOUND VAC;  Surgeon: Sheree Penne Bruckner, MD;  Location[facility] OR;  Service: Vascular;  Laterality: Left;   BALLOON DILATION N/A 12/31/2020   Procedure:  MERRILL HODGKIN;  Surgeon: Saintclair Jasper, MD;  Location: [facility] ENDOSCOPY;  Service: Gastroenterology;  Laterality: N/A;   BIOPSY  04/04/2018   Procedure: BIOPSY;  Surgeon: Charlanne Groom, MD;  Location: [facility] ENDOSCOPY;  Service: Endoscopy;;   BIOPSY  10/27/2020   Procedure: BIOPSY;  Surgeon: Burnette Fallow, MD;  Location: [facility] ENDOSCOPY;  Service: Endoscopy;;   BOTOX  INJECTION N/A 12/31/2020   Procedure: BOTOX  INJECTION;  Surgeon: Saintclair Jasper, MD;  Location[facility] ENDOSCOPY;  Service: Gastroenterology;  Laterality: N/A;   CARDIAC CATHETERIZATION  ~ 2011   COLONOSCOPY N/A 04/04/2018   Procedure: COLONOSCOPY;  Surgeon: Charlanne Groom, MD;  Location: [facility] ENDOSCOPY;  Service: Endoscopy;  Laterality: N/A;   COLONOSCOPY WITH PROPOFOL  N/A 10/27/2020   Procedure: COLONOSCOPY WITH PROPOFOL ;  Surgeon: Burnette Fallow, MD;  Location: [facility] ENDOSCOPY;  Service: Endoscopy;  Laterality: N/A;   ESOPHAGOGASTRODUODENOSCOPY N/A 12/31/2020   Procedure: ESOPHAGOGASTRODUODENOSCOPY (EGD);  Surgeon: Saintclair Jasper, MD;  Location: [facility] ENDOSCOPY;  Service: Gastroenterology;  Laterality: N/A;   ESOPHAGOGASTRODUODENOSCOPY N/A 03/05/2024   Procedure: EGD (ESOPHAGOGASTRODUODENOSCOPY);  Surgeon: Jinny Carmine, MD;  Location: [facility] ENDOSCOPY;  Service: Endoscopy;  Laterality: N/A;   ESOPHAGOGASTRODUODENOSCOPY (EGD) WITH PROPOFOL  N/A 10/27/2020   Procedure: ESOPHAGOGASTRODUODENOSCOPY (EGD) WITH PROPOFOL   WITH POSSIBLE DIL;  Surgeon: Burnette Fallow, MD;  Location: [facility] ENDOSCOPY;  Service: Endoscopy;  Laterality: N/A;   ESOPHAGOGASTRODUODENOSCOPY (EGD) WITH PROPOFOL  N/A 10/19/2021   Procedure: ESOPHAGOGASTRODUODENOSCOPY (EGD) WITH PROPOFOL ;  Surgeon: Unk Corinn Skiff, MD;  Location: [facility] ENDOSCOPY;  Service: Gastroenterology;  Laterality: N/A;   ESOPHAGOGASTRODUODENOSCOPY (EGD) WITH PROPOFOL  N/A 06/02/2022   Procedure: ESOPHAGOGASTRODUODENOSCOPY (EGD) WITH PROPOFOL ;  Surgeon: Therisa Bi, MD;  Location: [facility] ENDOSCOPY;  Service: Gastroenterology;  Laterality: N/A;    FOREIGN BODY REMOVAL  12/31/2020   Procedure: FOREIGN BODY REMOVAL;  Surgeon: Saintclair Jasper, MD;  Location: [facility] ENDOSCOPY;  Service: Gastroenterology;;   LOWER EXTREMITY ANGIOGRAM Left 05/27/2018   PERIPHERAL VASCULAR INTERVENTION Left 05/27/2018   Procedure: PERIPHERAL VASCULAR INTERVENTION;  Surgeon: Sheree Penne Bruckner, MD;  Location: [facility] INVASIVE CV LAB;  Service: Cardiovascular;  Laterality: Left;   POLYPECTOMY  04/04/2018   Procedure: POLYPECTOMY;  Surgeon: Charlanne Groom, MD;  Location: [facility] ENDOSCOPY;  Service: Endoscopy;;   POLYPECTOMY  10/27/2020   Procedure: POLYPECTOMY;  Surgeon: Burnette Fallow, MD;  Location: [facility] ENDOSCOPY;  Service: Endoscopy;;   TRANSMETATARSAL AMPUTATION Left 05/28/2018   Procedure: AMPUTATION TOES THREE, FOUR AND FIVE on left foot;  Surgeon: Sheree Penne Bruckner, MD;  Location: [facility] OR;  Service: Vascular;  Laterality: Left;   WOUND DEBRIDEMENT Left 06/06/2018   Procedure: DEBRIDEMENT WOUND LEFT  FOOT;  Surgeon: Sheree Penne Bruckner, MD;  Location: [facility] OR;  Service: Vascular;  Laterality: Left;     Social History:   reports that she has been smoking cigarettes. She has a 7 pack-year smoking history. She has never used smokeless tobacco. She reports current drug use. Drugs: Marijuana and Crack cocaine. She reports that she does not drink alcohol .   Family History:  Her family history includes Heart attack (age of onset: 55) in her mother; Stroke (age of onset: 57) in her brother and brother; Stroke (age of onset: 59) in her father.   Allergies Allergies  Allergen Reactions   Morphine  And Codeine Hives   Metformin  Diarrhea and Other (See Comments)    Allergic, per [facility]     Home Medications  Prior to Admission medications   Medication Sig Start Date End Date Taking? Authorizing Provider  acetaminophen  (TYLENOL ) 500 MG tablet Take 500 mg by mouth 3 (three) times daily.    [provider]  aspirin  EC 81 MG EC tablet Take 1 tablet (81 mg total)  by mouth daily. 09/21/18   Noemi Reena CROME, NP  atorvastatin  (LIPITOR ) 40 MG tablet Take 2 tablets (80 mg total) by mouth daily at 6 PM. 01/03/21   Hongalgi, Anand D, MD  baclofen  (LIORESAL ) 10 MG tablet Take 5 mg by mouth 3 (three) times daily.    [provider]  bisacodyl  (DULCOLAX) 10 MG suppository Place 10 mg rectally as needed for moderate constipation.    [provider]  carvedilol  (COREG ) 25 MG tablet Take 25 mg by mouth 2 (two) times daily with a meal.    [provider]  cloNIDine  (CATAPRES  - DOSED IN MG/24 HR) 0.3 mg/24hr patch 0.3 mg once a week. 11/08/21   [provider]  famotidine (PEPCID) 40 MG tablet Take 40 mg by mouth daily. 12/15/21   [provider]  feeding supplement (ENSURE ENLIVE / ENSURE PLUS) LIQD Take 237 mLs by mouth 2 (two) times daily between meals. Patient not taking: Reported on 09/20/2023 06/05/22   Caleen Qualia, MD  ferrous sulfate  220 (44 Fe) MG/5ML solution Take 7 mLs by mouth every Monday, Wednesday, and Friday.    [provider]  FLUoxetine  (PROZAC ) 40 MG capsule Take 80 mg by mouth daily. 12/26/21   [provider]  GVOKE HYPOPEN  1-PACK 1 MG/0.2ML SOAJ Inject 1 mg into the skin as needed. 03/03/24   [provider]  HUMULIN R  100 UNIT/ML injection Inject 0-9 Units into the skin 3 (three) times daily before meals. 12/25/23   [provider]  LANTUS  SOLOSTAR 100 UNIT/ML Solostar Pen Inject 15 Units into the skin at bedtime. 03/07/23   [provider]  lidocaine  (LMX) 4 % cream Apply 1 Application topically daily.    [provider]  magnesium  hydroxide (MILK OF MAGNESIA) 400 MG/5ML suspension Take by mouth daily as needed for mild constipation.    [provider]  melatonin 5 MG TABS Take 5 mg by mouth at bedtime.    [provider]  nitroGLYCERIN  (NITROSTAT ) 0.4 MG SL tablet Place 0.4 mg under the tongue every 5 (five) minutes as needed for chest pain.     [provider]  oxyCODONE  (ROXICODONE ) 5 MG/5ML solution Take 5 mLs by mouth every 8 (eight) hours as needed for moderate pain (pain score 4-6) or severe pain (pain score 7-10).    [provider]  pantoprazole  sodium (PROTONIX ) 40 mg Take 40 mg by mouth daily.  [provider]  polyethylene glycol (MIRALAX  / GLYCOLAX ) 17 g packet Take 17 g by mouth every Monday, Wednesday, and Friday.    [provider]  Potassium Chloride  10 % SOLN Take 15 mLs by mouth daily.    [provider]  promethazine  (PHENERGAN ) 25 MG/ML injection Inject 25 mg into the muscle as needed for nausea or vomiting. 05/25/22   [provider]  Semaglutide, 1 MG/DOSE, 2 MG/1.5ML SOPN Inject 2 mg into the skin every Wednesday. 03/12/23   [provider]  senna (SENOKOT) 8.6 MG tablet Take 1 tablet by mouth daily.    [provider]  traZODone  (DESYREL ) 50 MG tablet Take 25 mg by mouth at bedtime.    [provider]     Critical care provider statement:   Total critical care time: 33 minutes   Performed by: Parris MD   Critical care time was exclusive of separately billable procedures and treating other patients.   Critical care was necessary to treat or prevent imminent or life-threatening deterioration.   Critical care was time spent personally by me on the following activities: development of treatment plan with patient and/or surrogate as well as nursing, discussions with consultants, evaluation of patient's response to treatment, examination of patient, obtaining history from patient or surrogate, ordering and performing treatments and interventions, ordering and review of laboratory studies, ordering and review of radiographic studies, pulse oximetry and re-evaluation of patient's condition.    Prarthana Parlin, M.D.  Pulmonary & Critical Care Medicine

## 2024-03-16 LAB — BASIC METABOLIC PANEL WITH GFR
Anion gap: 9 (ref 5–15)
Anion gap: 10 (ref 5–15)
BUN: 25 mg/dL — ABNORMAL HIGH (ref 6–20)
BUN: 25 mg/dL — ABNORMAL HIGH (ref 6–20)
CO2: 18 mmol/L — ABNORMAL LOW (ref 22–32)
CO2: 19 mmol/L — ABNORMAL LOW (ref 22–32)
Calcium: 8.6 mg/dL — ABNORMAL LOW (ref 8.9–10.3)
Calcium: 8.5 mg/dL — ABNORMAL LOW (ref 8.9–10.3)
Chloride: 111 mmol/L (ref 98–111)
Chloride: 110 mmol/L (ref 98–111)
Creatinine, Ser: 3.57 mg/dL — ABNORMAL HIGH (ref 0.44–1.00)
Creatinine, Ser: 3.77 mg/dL — ABNORMAL HIGH (ref 0.44–1.00)
GFR, Estimated: 15 mL/min — ABNORMAL LOW (ref >60)
GFR, Estimated: 14 mL/min — ABNORMAL LOW (ref >60)
Glucose, Bld: 153 mg/dL — ABNORMAL HIGH (ref 70–99)
Glucose, Bld: 124 mg/dL — ABNORMAL HIGH (ref 70–99)
Potassium: 5.5 mmol/L — ABNORMAL HIGH (ref 3.5–5.1)
Potassium: 5.7 mmol/L — ABNORMAL HIGH (ref 3.5–5.1)
Sodium: 138 mmol/L (ref 135–145)
Sodium: 139 mmol/L (ref 135–145)

## 2024-03-16 LAB — RENAL FUNCTION PANEL
Albumin: 2.2 g/dL — ABNORMAL LOW (ref 3.5–5.0)
Anion gap: 10 (ref 5–15)
BUN: 22 mg/dL — ABNORMAL HIGH (ref 6–20)
CO2: 16 mmol/L — ABNORMAL LOW (ref 22–32)
Calcium: 8.9 mg/dL (ref 8.9–10.3)
Chloride: 109 mmol/L (ref 98–111)
Creatinine, Ser: 3.16 mg/dL — ABNORMAL HIGH (ref 0.44–1.00)
GFR, Estimated: 17 mL/min — ABNORMAL LOW (ref >60)
Glucose, Bld: 125 mg/dL — ABNORMAL HIGH (ref 70–99)
Phosphorus: 4.8 mg/dL — ABNORMAL HIGH (ref 2.5–4.6)
Potassium: 5.7 mmol/L — ABNORMAL HIGH (ref 3.5–5.1)
Sodium: 135 mmol/L (ref 135–145)

## 2024-03-16 LAB — CBC
HCT: 29.7 % — ABNORMAL LOW (ref 36.0–46.0)
Hemoglobin: 9.3 g/dL — ABNORMAL LOW (ref 12.0–15.0)
MCH: 23.7 pg — ABNORMAL LOW (ref 26.0–34.0)
MCHC: 31.3 g/dL (ref 30.0–36.0)
MCV: 75.8 fL — ABNORMAL LOW (ref 80.0–100.0)
Platelets: 249 K/uL (ref 150–400)
RBC: 3.92 MIL/uL (ref 3.87–5.11)
RDW: 19 % — ABNORMAL HIGH (ref 11.5–15.5)
WBC: 17.9 K/uL — ABNORMAL HIGH (ref 4.0–10.5)
nRBC: 0 % (ref 0.0–0.2)

## 2024-03-16 LAB — GLUCOSE, CAPILLARY
Glucose-Capillary: 117 mg/dL — ABNORMAL HIGH (ref 70–99)
Glucose-Capillary: 122 mg/dL — ABNORMAL HIGH (ref 70–99)
Glucose-Capillary: 138 mg/dL — ABNORMAL HIGH (ref 70–99)
Glucose-Capillary: 138 mg/dL — ABNORMAL HIGH (ref 70–99)
Glucose-Capillary: 152 mg/dL — ABNORMAL HIGH (ref 70–99)
Glucose-Capillary: 92 mg/dL (ref 70–99)

## 2024-03-16 MED ORDER — INSULIN ASPART 100 UNIT/ML IV SOLN
10.0000 [IU] | Freq: Once | INTRAVENOUS | Status: AC
  Administered 2024-03-16: 10 [IU] via INTRAVENOUS
  Filled 2024-03-16: qty 0.1

## 2024-03-16 MED ORDER — CHLORHEXIDINE GLUCONATE CLOTH 2 % EX PADS
6.0000 | MEDICATED_PAD | Freq: Every day | CUTANEOUS | Status: DC
  Administered 2024-03-16 – 2024-03-20 (×5): 6 via TOPICAL

## 2024-03-16 MED ORDER — SODIUM BICARBONATE 8.4 % IV SOLN
50.0000 meq | Freq: Once | INTRAVENOUS | Status: AC
  Administered 2024-03-16: 50 meq via INTRAVENOUS
  Filled 2024-03-16: qty 50

## 2024-03-16 MED ORDER — INSULIN ASPART 100 UNIT/ML IV SOLN
10.0000 [IU] | Freq: Once | INTRAVENOUS | Status: AC
  Administered 2024-03-16: 10 [IU] via INTRAVENOUS
  Filled 2024-03-16 (×2): qty 0.1

## 2024-03-16 MED ORDER — DEXTROSE 50 % IV SOLN
1.0000 | Freq: Once | INTRAVENOUS | Status: AC
  Administered 2024-03-16: 50 mL via INTRAVENOUS
  Filled 2024-03-16: qty 50

## 2024-03-16 NOTE — Progress Notes (Signed)
 NAME:  Diamond Rice, MRN:  [id number], DOB:  1971-02-24, LOS: 2 /ADMISSION DATE:  03/14/2024, CONSULTATION DATE:  03/14/2024 REFERRING MD:  Dr. Cyrena, CHIEF COMPLAINT:  Chest pain   Brief Pt Description / Synopsis:  53 y.o. female admitted with Type A Aortic Dissection, deemed to be very poor surgical candidate for surgical repair.   History of Present Illness:  Diamond Rice is a 53 y.o. female  with a past medical history of achalasia, esophageal impactions, polysubstance use, stroke with right hemiparesis (bed bound), hypertension, COPD, diabetes mellitus, and morbid obesity who presents from her skilled nursing facility to the emergency department on 03/14/2024 with complaints of acute onset nausea/vomiting/chest pain and abdominal pain.  She reported she had vomited several times with associated discomfort in her chest/abdomen/upper neck.  She denies dizziness, palpitations, shortness of breath, diarrhea, melena, hematochezia, dysuria, fever or chills.  Of note she was recently admitted at [facility] from 03/05/2024 through 03/06/2024 with Esophageal Obstruction due to food impaction and E. Coli UTI.   ED Course: Initial Vital Signs: Temperature 98.4 F orally, respiratory rate 18, pulse 70, blood pressure 157/61, SpO2 98% on room air Significant Labs: Potassium 2.9, glucose 261, BUN 9, creatinine 1.4, WBC 11.6, hemoglobin 10.1 Imaging CT Chest/Abdomen/Pelvis w contrast>>IMPRESSION: 1. DeBakey type I aortic dissection with absent contrast opacification within the false lumen distal to the posterior arch, greater than the opacified true lumen. No dissection flap identified extending into the arch vessels. 2. Nonocclusive Dissection flap extends into the proximal celiac artery 3. The left renal artery appears to arise off the false lumen with absent contrast opacification signs of left renal infarct involving the entire upper pole and portions of the inferior pole. 4. Dilated and patulous  esophagus, possibly related to chronic dysmotility. 5. Mild aneurysmal dilatation of the ascending thoracic aorta measuring 4 cm. 6. Aortic atherosclerosis and coronary artery calcifications Medications Administered: 20 mg hydralazine , IV Dilaudid ,, IV Zofran  ,esmolol  drip initiated  ED provider Dr. Cyrena discussed with Dr. Jama of vascular surgery, and recommended transfer to tertiary center as unable to opt for surgical options here at [facility] for type a dissection.  Telephone conference was held with Dr. Josepha, the patient, patient's sister, and Dr. Coletta of [facility] cardiothoracic surgery.  She was deemed to be a very poor surgical candidate with [facility]'s recommendation for medical medical management including impulse control and considering focusing just on comfort.  The patient and family agreed for admission here at [facility] for medical management understanding very high risk of mortality.  PCCM asked to admit for further workup and treatment.  Please see Significant Hospital Events section below for full detailed hospital course.  03/15/24- patient with AKI today on ACE inhibitor which we will dc.  Her BP is better controlled this am.  We have palliative care following now due to non surgical cancidate with severe aortic dissection.  03/16/24- patient sleeping this am, weaning off precedex .  Remains on room air.  Renal impairment worse.  Prognosis is poor with non operable aortic dissection. On Esmolol  drip. We reached out to [facility] cardiothoracic surgery and I personally spoke with surgery attending.  After review of case it appears that due to her hemiparesis and bedbound status with comorbidities she is a non operable candidate.  [facility] cardiothroacic surgery also evaluated her case when she first arrived in ED and also stated she was not a surgical candidate. Palliative care is on case now.   Pertinent  Medical History   Past  Medical History:  Diagnosis Date   Acute ischemic stroke (HCC) 12/27/2020    Acute renal failure (ARF) (HCC) 04/02/2018   Anxiety    Chronic lower back pain 04/17/2012   just got over some; catched when I walked   COPD (chronic obstructive pulmonary disease) (HCC)    Critical lower limb ischemia (HCC) 05/27/2018   Depression    Headache(784.0) 04/17/2012   ~ qod; lately waking up in am w/one   High cholesterol    Hypertension    Migraines 04/17/2012   Obesity    Sleep apnea    CPAP   Stroke (HCC)    Type II diabetes mellitus (HCC) 08/28/2002    Micro Data:  7/18: MRSA PCR>>negative  Antimicrobials:   Anti-infectives (From admission, onward)    None       Significant Hospital Events: Including procedures, antibiotic start and stop dates in addition to other pertinent events   7/18: Found to have Type A Aortic Dissection, Vascular Surgery consulted, recommended transfer to tertiary care center as unable to offer surgical intervention here at [facility].  Deemed to be very poor surgical candidate by [facility] Cardiothoracic Surgery.  Recommends medically treating and allow time for outcomes knowing high risk for mortality.  PCCM asked to admit, code Status changed to DNR/DNI.  Interim History / Subjective:  As outlined above under Significant Hospital Events section  Objective   Blood pressure (!) 117/55, pulse 92, temperature (!) 97.5 F (36.4 C), temperature source Axillary, resp. rate 15, height 5' 1 (1.549 m), weight 117.9 kg, last menstrual period 04/26/2018, SpO2 97%.        Intake/Output Summary (Last 24 hours) at 03/16/2024 0855 Last data filed at 03/16/2024 0700 Gross per 24 hour  Intake 2806.29 ml  Output 50 ml  Net 2756.29 ml   Filed Weights   03/14/24 1110 03/16/24 0247  Weight: 118.1 kg 117.9 kg    Examination: General: Acute on chronically ill-appearing obese female, laying in bed, in no acute distress HENT: Atraumatic, normal cephalic, neck supple, no JVD Lungs: Clear breath sounds, Rice, nonlabored Cardiovascular: Regular rate and  rhythm, S1-S2, murmurs, rubs, gallops Abdomen: Obese, soft, tender to palpation, no guarding or rebound tenderness, bowel sounds positive x 4 Extremities: Generalized weakness (right-sided hemiparesis at baseline from previous stroke), no cyanosis, warm extremities Neuro: Awake and alert, oriented x 4 (intermittently lethargic following IV pushes of narcotics), moves all extremities commands, pupils PERRLA GU: Deferred  Resolved Hospital Problem list     Assessment & Plan:   #Type A Aortic Dissection PMHx: HTN, HLD, stroke with right hemiparesis -Continuous cardiac monitoring -Maintain HR <60 & SBP 100-120 -Esmolol  and Nicardipine  drips as needed  -Prn Hydralazine  -Pain control -Vascular Surgery consulted, appreciate input -ED Provider Dr. Cyrena and pt/family discussed with [facility] Cardiothoracic Surgeon Dr. Coletta via telephone  ~ deemed to be VERY POOR SURGICAL CANDIDATE ~ recommended medical management -[facility] cardiothoracic surgery - not a surgical candidate   #COPD without acute exacerbation PMHx: OSA, morbid obesity -Supplemental O2 as needed to maintain O2 sats 88 to 92% -Follow intermittent Chest X-ray & ABG as needed -Bronchodilators prn -Pulmonary toilet as able  #Acute Kidney Injury #Hypokalemia -Monitor I&O's / urinary output -Follow BMP -Ensure adequate renal perfusion -Avoid nephrotoxic agents as able -Replace electrolytes as indicated ~ Pharmacy following for assistance with electrolyte replacement -IV Fluids  #Anemia -Monitor for S/Sx of bleeding -Trend CBC -SCD's for VTE Prophylaxis  -Transfuse for Hgb <7   # Bedbound status with right  sided hemiparesis      DNR code status    - palliative care consultation   #Mild Leukocytosis, likely reactive PMHx: recent E. Coli UTI 03/05/24 -Monitor fever curve -Trend WBC's  -Follow cultures as above -Will hold off on empiric ABX as this time pending cultures & sensitivities  #Diabetes Mellitus Type II -CBG's q4h;  Target range of 140 to 180 -SSI -Follow ICU Hypo/Hyperglycemia protocol    Best Practice (right click and Reselect all SmartList Selections daily)   Diet/type: Regular consistency (see orders) DVT prophylaxis: SCD GI prophylaxis: N/A Lines: Central line and yes and it is still needed Foley:  N/A Code Status:  DNR Last date of multidisciplinary goals of care discussion [03/14/24]    Labs   CBC: Recent Labs  Lab 03/14/24 0418 03/15/24 0626 03/16/24 0804  WBC 11.6* 10.7* 17.9*  NEUTROABS 8.4*  --   --   HGB 10.1* 9.6* 9.3*  HCT 32.5* 30.5* 29.7*  MCV 76.5* 76.8* 75.8*  PLT 276 225 249    Basic Metabolic Panel: Recent Labs  Lab 03/14/24 0418 03/15/24 0626 03/16/24 0804  NA 141 140 135  K 2.9* 4.4 5.7*  CL 103 112* 109  CO2 28 23 16*  GLUCOSE 261* 121* 125*  BUN 9 15 22*  CREATININE 1.41* 2.22* 3.16*  CALCIUM  9.3 8.6* 8.9  MG 2.0 1.9  --   PHOS 2.8 3.4 4.8*   GFR: Estimated Creatinine Clearance: 25.2 mL/min (A) (by C-G formula based on SCr of 3.16 mg/dL (H)). Recent Labs  Lab 03/14/24 0418 03/15/24 0626 03/16/24 0804  WBC 11.6* 10.7* 17.9*    Liver Function Tests: Recent Labs  Lab 03/14/24 0418 03/15/24 0626 03/16/24 0804  AST 16  --   --   ALT 14  --   --   ALKPHOS 95  --   --   BILITOT 0.5  --   --   PROT 7.5  --   --   ALBUMIN 3.0* 2.5* 2.2*   Recent Labs  Lab 03/14/24 0418  LIPASE 30   No results for input(s): AMMONIA in the last 168 hours.  ABG    Component Value Date/Time   PHART 7.487 (H) 12/06/2019 1802   PCO2ART 32.6 12/06/2019 1802   PO2ART 69.0 (L) 12/06/2019 1802   HCO3 24.7 12/06/2019 1802   TCO2 28 12/27/2020 1755   O2SAT 95.0 12/06/2019 1802     Coagulation Profile: No results for input(s): INR, PROTIME in the last 168 hours.  Cardiac Enzymes: No results for input(s): CKTOTAL, CKMB, CKMBINDEX, TROPONINI in the last 168 hours.  HbA1C: Hgb A1c MFr Bld  Date/Time Value Ref Range Status   03/04/2024 10:37 PM 8.6 (H) 4.8 - 5.6 % Final    Comment:    (NOTE)         Prediabetes: 5.7 - 6.4         Diabetes: >6.4         Glycemic control for adults with diabetes: <7.0   06/01/2022 05:05 AM 7.1 (H) 4.8 - 5.6 % Final    Comment:    (NOTE) Pre diabetes:          5.7%-6.4%  Diabetes:              >6.4%  Glycemic control for   <7.0% adults with diabetes     CBG: Recent Labs  Lab 03/15/24 1715 03/15/24 2002 03/15/24 2345 03/16/24 0400 03/16/24 0800  GLUCAP 182* 147* 128* 92 117*  Review of Systems:   Positives in BOLD: Gen: Denies fever, chills, weight change, fatigue, night sweats HEENT: Denies blurred vision, double vision, hearing loss, tinnitus, sinus congestion, rhinorrhea, sore throat, neck stiffness, dysphagia PULM: Denies shortness of breath, cough, sputum production, hemoptysis, wheezing CV: Denies chest pain, edema, orthopnea, paroxysmal nocturnal dyspnea, palpitations GI: Denies abdominal pain, nausea, vomiting, diarrhea, hematochezia, melena, constipation, change in bowel habits GU: Denies dysuria, hematuria, polyuria, oliguria, urethral discharge Endocrine: Denies hot or cold intolerance, polyuria, polyphagia or appetite change Derm: Denies rash, dry skin, scaling or peeling skin change Heme: Denies easy bruising, bleeding, bleeding gums Neuro: Denies headache, numbness, weakness, slurred speech, loss of memory or consciousness   Past Medical History:  She,  has a past medical history of Acute ischemic stroke (HCC) (12/27/2020), Acute renal failure (ARF) (HCC) (04/02/2018), Anxiety, Chronic lower back pain (04/17/2012), COPD (chronic obstructive pulmonary disease) (HCC), Critical lower limb ischemia (HCC) (05/27/2018), Depression, Headache(784.0) (04/17/2012), High cholesterol, Hypertension, Migraines (04/17/2012), Obesity, Sleep apnea, Stroke (HCC), and Type II diabetes mellitus (HCC) (08/28/2002).   Surgical History:   Past Surgical History:  Procedure  Laterality Date   ABDOMINAL AORTOGRAM W/LOWER EXTREMITY Left 05/27/2018   Procedure: ABDOMINAL AORTOGRAM W/LOWER EXTREMITY Runoff and Possible Intervention;  Surgeon: Sheree Penne Bruckner, MD;  Location: [facility] INVASIVE CV LAB;  Service: Cardiovascular;  Laterality: Left;   APPLICATION OF WOUND VAC Left 06/06/2018   Procedure: APPLICATION OF WOUND VAC;  Surgeon: Sheree Penne Bruckner, MD;  Location: [facility] OR;  Service: Vascular;  Laterality: Left;   BALLOON DILATION N/A 12/31/2020   Procedure: MERRILL HODGKIN;  Surgeon: Saintclair Jasper, MD;  Location: [facility] ENDOSCOPY;  Service: Gastroenterology;  Laterality: N/A;   BIOPSY  04/04/2018   Procedure: BIOPSY;  Surgeon: Charlanne Groom, MD;  Location: [facility] ENDOSCOPY;  Service: Endoscopy;;   BIOPSY  10/27/2020   Procedure: BIOPSY;  Surgeon: Burnette Fallow, MD;  Location: [facility] ENDOSCOPY;  Service: Endoscopy;;   BOTOX  INJECTION N/A 12/31/2020   Procedure: BOTOX  INJECTION;  Surgeon: Saintclair Jasper, MD;  Location: [facility] ENDOSCOPY;  Service: Gastroenterology;  Laterality: N/A;   CARDIAC CATHETERIZATION  ~ 2011   COLONOSCOPY N/A 04/04/2018   Procedure: COLONOSCOPY;  Surgeon: Charlanne Groom, MD;  Location: [facility] ENDOSCOPY;  Service: Endoscopy;  Laterality: N/A;   COLONOSCOPY WITH PROPOFOL  N/A 10/27/2020   Procedure: COLONOSCOPY WITH PROPOFOL ;  Surgeon: Burnette Fallow, MD;  Location: [facility] ENDOSCOPY;  Service: Endoscopy;  Laterality: N/A;   ESOPHAGOGASTRODUODENOSCOPY N/A 12/31/2020   Procedure: ESOPHAGOGASTRODUODENOSCOPY (EGD);  Surgeon: Saintclair Jasper, MD;  Location: [facility] ENDOSCOPY;  Service: Gastroenterology;  Laterality: N/A;   ESOPHAGOGASTRODUODENOSCOPY N/A 03/05/2024   Procedure: EGD (ESOPHAGOGASTRODUODENOSCOPY);  Surgeon: Jinny Carmine, MD;  Location: [facility] ENDOSCOPY;  Service: Endoscopy;  Laterality: N/A;   ESOPHAGOGASTRODUODENOSCOPY (EGD) WITH PROPOFOL  N/A 10/27/2020   Procedure: ESOPHAGOGASTRODUODENOSCOPY (EGD) WITH PROPOFOL   WITH POSSIBLE DIL;  Surgeon: Burnette Fallow, MD;  Location: [facility]  ENDOSCOPY;  Service: Endoscopy;  Laterality: N/A;   ESOPHAGOGASTRODUODENOSCOPY (EGD) WITH PROPOFOL  N/A 10/19/2021   Procedure: ESOPHAGOGASTRODUODENOSCOPY (EGD) WITH PROPOFOL ;  Surgeon: Unk Corinn Skiff, MD;  Location: [facility] ENDOSCOPY;  Service: Gastroenterology;  Laterality: N/A;   ESOPHAGOGASTRODUODENOSCOPY (EGD) WITH PROPOFOL  N/A 06/02/2022   Procedure: ESOPHAGOGASTRODUODENOSCOPY (EGD) WITH PROPOFOL ;  Surgeon: Therisa Bi, MD;  Location: [facility] ENDOSCOPY;  Service: Gastroenterology;  Laterality: N/A;   FOREIGN BODY REMOVAL  12/31/2020   Procedure: FOREIGN BODY REMOVAL;  Surgeon: Saintclair Jasper, MD;  Location: [facility] ENDOSCOPY;  Service: Gastroenterology;;   LOWER EXTREMITY ANGIOGRAM Left 05/27/2018   PERIPHERAL VASCULAR INTERVENTION Left 05/27/2018  Procedure: PERIPHERAL VASCULAR INTERVENTION;  Surgeon: Sheree Penne Bruckner, MD;  Location: [facility] INVASIVE CV LAB;  Service: Cardiovascular;  Laterality: Left;   POLYPECTOMY  04/04/2018   Procedure: POLYPECTOMY;  Surgeon: Charlanne Groom, MD;  Location: [facility] ENDOSCOPY;  Service: Endoscopy;;   POLYPECTOMY  10/27/2020   Procedure: POLYPECTOMY;  Surgeon: Burnette Fallow, MD;  Location: [facility] ENDOSCOPY;  Service: Endoscopy;;   TRANSMETATARSAL AMPUTATION Left 05/28/2018   Procedure: AMPUTATION TOES THREE, FOUR AND FIVE on left foot;  Surgeon: Sheree Penne Bruckner, MD;  Location: [facility] OR;  Service: Vascular;  Laterality: Left;   WOUND DEBRIDEMENT Left 06/06/2018   Procedure: DEBRIDEMENT WOUND LEFT FOOT;  Surgeon: Sheree Penne Bruckner, MD;  Location: [facility] OR;  Service: Vascular;  Laterality: Left;     Social History:   reports that she has been smoking cigarettes. She has a 7 pack-year smoking history. She has never used smokeless tobacco. She reports current drug use. Drugs: Marijuana and Crack cocaine. She reports that she does not drink alcohol .   Family History:  Her family history includes Heart attack (age of onset: 9) in her mother; Stroke (age of onset:  45) in her brother and brother; Stroke (age of onset: 68) in her father.   Allergies Allergies  Allergen Reactions   Morphine  And Codeine Hives   Metformin  Diarrhea and Other (See Comments)    Allergic, per [facility]     Home Medications  Prior to Admission medications   Medication Sig Start Date End Date Taking? Authorizing Provider  acetaminophen  (TYLENOL ) 500 MG tablet Take 500 mg by mouth 3 (three) times daily.    [provider]  aspirin  EC 81 MG EC tablet Take 1 tablet (81 mg total) by mouth daily. 09/21/18   Noemi Reena CROME, NP  atorvastatin  (LIPITOR ) 40 MG tablet Take 2 tablets (80 mg total) by mouth daily at 6 PM. 01/03/21   Hongalgi, Anand D, MD  baclofen  (LIORESAL ) 10 MG tablet Take 5 mg by mouth 3 (three) times daily.    [provider]  bisacodyl  (DULCOLAX) 10 MG suppository Place 10 mg rectally as needed for moderate constipation.    [provider]  carvedilol  (COREG ) 25 MG tablet Take 25 mg by mouth 2 (two) times daily with a meal.    [provider]  cloNIDine  (CATAPRES  - DOSED IN MG/24 HR) 0.3 mg/24hr patch 0.3 mg once a week. 11/08/21   [provider]  famotidine (PEPCID) 40 MG tablet Take 40 mg by mouth daily. 12/15/21   [provider]  feeding supplement (ENSURE ENLIVE / ENSURE PLUS) LIQD Take 237 mLs by mouth 2 (two) times daily between meals. Patient not taking: Reported on 09/20/2023 06/05/22   Caleen Qualia, MD  ferrous sulfate  220 (44 Fe) MG/5ML solution Take 7 mLs by mouth every Monday, Wednesday, and Friday.    [provider]  FLUoxetine  (PROZAC ) 40 MG capsule Take 80 mg by mouth daily. 12/26/21   [provider]  GVOKE HYPOPEN  1-PACK 1 MG/0.2ML SOAJ Inject 1 mg into the skin as needed. 03/03/24   [provider]  HUMULIN R  100 UNIT/ML injection Inject 0-9 Units into the skin 3 (three) times daily before meals. 12/25/23   [provider]  LANTUS  SOLOSTAR 100 UNIT/ML Solostar Pen Inject  15 Units into the skin at bedtime. 03/07/23   [provider]  lidocaine  (LMX) 4 % cream Apply 1 Application topically daily.    [provider]  magnesium  hydroxide (MILK OF MAGNESIA) 400 MG/5ML  suspension Take by mouth daily as needed for mild constipation.    [provider]  melatonin 5 MG TABS Take 5 mg by mouth at bedtime.    [provider]  nitroGLYCERIN  (NITROSTAT ) 0.4 MG SL tablet Place 0.4 mg under the tongue every 5 (five) minutes as needed for chest pain.    [provider]  oxyCODONE  (ROXICODONE ) 5 MG/5ML solution Take 5 mLs by mouth every 8 (eight) hours as needed for moderate pain (pain score 4-6) or severe pain (pain score 7-10).    [provider]  pantoprazole  sodium (PROTONIX ) 40 mg Take 40 mg by mouth daily.    [provider]  polyethylene glycol (MIRALAX  / GLYCOLAX ) 17 g packet Take 17 g by mouth every Monday, Wednesday, and Friday.    [provider]  Potassium Chloride  10 % SOLN Take 15 mLs by mouth daily.    [provider]  promethazine  (PHENERGAN ) 25 MG/ML injection Inject 25 mg into the muscle as needed for nausea or vomiting. 05/25/22   [provider]  Semaglutide, 1 MG/DOSE, 2 MG/1.5ML SOPN Inject 2 mg into the skin every Wednesday. 03/12/23   [provider]  senna (SENOKOT) 8.6 MG tablet Take 1 tablet by mouth daily.    [provider]  traZODone  (DESYREL ) 50 MG tablet Take 25 mg by mouth at bedtime.    [provider]     Critical care provider statement:   Total critical care time: 63 minutes   Performed by: Parris MD   Critical care time was exclusive of separately billable procedures and treating other patients.   Critical care was necessary to treat or prevent imminent or life-threatening deterioration.   Critical care was time spent personally by me on the following activities: development of treatment plan with patient and/or surrogate  as well as nursing, discussions with consultants, evaluation of patient's response to treatment, examination of patient, obtaining history from patient or surrogate, ordering and performing treatments and interventions, ordering and review of laboratory studies, ordering and review of radiographic studies, pulse oximetry and re-evaluation of patient's condition.    Jema Deegan, M.D.  Pulmonary & Critical Care Medicine

## 2024-03-16 NOTE — Plan of Care (Signed)
  Problem: Clinical Measurements: Goal: Will remain free from infection Outcome: Progressing Goal: Respiratory complications will improve Outcome: Progressing   Problem: Coping: Goal: Level of anxiety will decrease Outcome: Progressing   Problem: Pain Managment: Goal: General experience of comfort will improve and/or be controlled Outcome: Progressing   Problem: Safety: Goal: Ability to remain free from injury will improve Outcome: Progressing   Problem: Skin Integrity: Goal: Risk for impaired skin integrity will decrease Outcome: Progressing   Problem: Education: Goal: Knowledge of General Education information will improve Description: Including pain rating scale, medication(s)/side effects and non-pharmacologic comfort measures Outcome: Not Progressing   Problem: Clinical Measurements: Goal: Ability to maintain clinical measurements within normal limits will improve Outcome: Not Progressing Goal: Diagnostic test results will improve Outcome: Not Progressing Goal: Cardiovascular complication will be avoided Outcome: Not Progressing   Problem: Activity: Goal: Risk for activity intolerance will decrease Outcome: Not Progressing   Problem: Nutrition: Goal: Adequate nutrition will be maintained Outcome: Not Progressing

## 2024-03-16 NOTE — Progress Notes (Addendum)
 Spoke with [facility] and discussed case with on call Cardiothoracic Surgeon Dr. Wyona, pt deemed not a surgical candidate.  Overall, prognosis extremely poor will continue to discuss goals of care with pts family with assistance of Palliative Care Nurse Practitioner.   Lonell Moose, AGNP  Pulmonary/Critical Care Pager [phone] (please enter 7 digits) PCCM Consult Pager [phone] (please enter 7 digits)

## 2024-03-16 NOTE — Plan of Care (Signed)
  Problem: Pain Managment: Goal: General experience of comfort will improve and/or be controlled Outcome: Progressing   Problem: Skin Integrity: Goal: Risk for impaired skin integrity will decrease Outcome: Progressing   Problem: Metabolic: Goal: Ability to maintain appropriate glucose levels will improve Outcome: Progressing

## 2024-03-16 NOTE — Progress Notes (Signed)
 Spoke with pts brother Lytle Hope via telephone he states he has not been able to get in touch with his sister Rodrick Dee to discuss goals of care pertaining to Ms. Daft.  I asked Mr. Hope if he would like me to attempt to reach out to Ms. McFarland and discuss transitioning pt to full comfort measures, he stated yes.  I attempted to contact Ms. McFarland via telephone, however she did not answer the telephone.  Left HIPAA appropriate voicemail message instructing her to return my phone call.  Will continue to monitor and assess pt.    Lonell Moose, AGNP  Pulmonary/Critical Care Pager [phone] (please enter 7 digits) PCCM Consult Pager [phone] (please enter 7 digits)

## 2024-03-16 NOTE — Plan of Care (Signed)
 Referral previously received to assist with goals of care discussion.   Previous palliative provider was unable to complete consult 7/19 due to inability to contact family in regards to goals of care discussion.   Chart reviewed for Recent provider notes, nurse notes, vitals, and labs and updates received from RN.   Upon assessment today, patient is unable to engage in conversation or communicate wishes.   Collaborated with Diamond Rice, CCM NP, who reports she spoke with patient's brother, Diamond Rice who states he has not been able to contact patients sister, Diamond Rice. Diamond Rice left voicemail for sister to return call. Upon last update from Diamond Rice, Diamond Rice had not returned call.   Will follow up with attempt to complete consult tomorrow (7/21).    Thank you for your referral and allowing PMT to assist in the care of Diamond Rice.      Diamond Rice, Diamond Rice [facility] Palliative Medicine Team  03/16/2024 4:54 PM  Office [phone]  Pager [phone]

## 2024-03-16 NOTE — Consult Note (Incomplete)
 Consultation Note Date: 03/16/2024 at 1130  [name]  DOB: [birth year]  MRN: [id number]  Age / Sex: 53 y.o., female  PCP: [facility] Referring Physician: Parris Manna, MD  HPI/Patient Profile: 53 y.o. female  with past medical history significant for achalasia, esophageal impactions, polysubstance use, CVA with right hemiparesis, bedbound status, HTN, COPD, DM type II and morbid obesity. Patient presented to ED from [facility] 03/14/2024 c/o acute onset N/V, CP and abdominal pain.  ED workup showed labs significant for K+ 2.9, glucose 261, BUN 9, creatinine 1.4, WBC 11.6 and Hgb 10.1. ED vitals BP 157/61, HR70, RR 18, SpO2 98% RA and 98.4 F. CT CAP revealed:  1. DeBakey type I aortic dissection with absent contrast opacification within the false lumen distal to the posterior arch, greater than the opacified true lumen. No dissection flap identified extending into the arch vessels. 2. Nonocclusive Dissection flap extends into the proximal celiac artery 3. The left renal artery appears to arise off the false lumen with absent contrast opacification signs of left renal infarct involving the entire upper pole and portions of the inferior pole. 4. Dilated and patulous esophagus, possibly related to chronic dysmotility. 5. Mild aneurysmal dilatation of the ascending thoracic aorta measuring 4 cm. 6. Aortic atherosclerosis and coronary artery calcifications  EDP contacted vascular surgery who recommended transfer to tertiary center due to surgical options not available at [facility] for type a dissection.  Telephone conference with Diamond Rice, patient, patient's sister and Diamond Rice of [facility] cardiothoracic surgery was held with the patient being deemed very poor surgical candidate with recommendation of medical management with consideration of focusing on comfort care.  She  was started on esmolol  and nicardipine  drip and admitted to [facility] for management of type a aortic dissection.  Palliative was consulted to assist with GOC conversations.  Clinical Assessment and Goals of Care:  Ill-appearing, morbidly obese female resting in bed.  She opens eyes to verbal stimuli but immediately falls back to sleep.  He is unable to participate in meaningful conversation or answer questions appropriately.  Even, unlabored respirations.  She is in no distress.  Extensive chart review completed prior to meeting patient including labs, vital signs, imaging, progress notes, orders, and available advanced directive documents from current and previous encounters. I then met with patient and family to discuss diagnosis prognosis, GOC, EOL wishes, disposition and options.  I introduced Palliative Medicine as specialized medical care for people living with serious illness. It focuses on providing relief from the symptoms and stress of a serious illness. The goal is to improve quality of life for both the patient and the family.  We discussed a brief life review of the patient.   As far as functional and nutritional status.  Patient currently resides at [facility] and is bedbound.  We discussed patient's current illness and what it means in the larger context of patient's on-going co-morbidities.  Natural disease trajectory and expectations at EOL were discussed.  I attempted to elicit values  and goals of care important to the patient.    The difference between aggressive medical intervention and comfort care was considered in light of the patient's goals of care.   Advance directives, concepts specific to code status, artificial feeding and hydration, and rehospitalization were considered and discussed.  Education offered regarding concept specific to human mortality and the limitations of medical interventions to prolong life when the body begins to fail to thrive.  Family  is facing treatment option decisions, advanced directive, and anticipatory care needs.    Discussed with patient/family the importance of continued conversation with family and the medical providers regarding overall plan of care and treatment options, ensuring decisions are within the context of the patient's values and GOCs.    Hospice and Palliative Care services outpatient were explained and offered.  Questions and concerns were addressed. The family was encouraged to call with questions or concerns.   Primary Decision Maker NEXT OF KIN  Physical Exam Vitals reviewed.  Constitutional:      General: She is not in acute distress.    Appearance: She is obese.  HENT:     Head: Normocephalic and atraumatic.  Pulmonary:     Effort: Pulmonary effort is normal. No respiratory distress.  Skin:    General: Skin is warm and dry.     Palliative Assessment/Data: 10%   Addendum 11:46: Diamond Rice, CC NP, notified case was discussed with cardiothoracic surgeon at [facility] for possible transfer.  Dr. Wyona, CTS, has deemed patient not a surgical candidate due to extremely poor prognosis. Diamond Rice has also contacted patient's brother and sister for goals of care discussion.  Patient's brother Mr. Geofm requested Diamond Rice reach out to Ms. Alona, sister, to discuss transition to full comfort measures.  Ms. Alona was unable to be contacted.    Will continue to attempt to contact sister.  Thank you for this consult. Palliative medicine will continue to follow and assist holistically.   Time Total: *** minutes  Time spent includes: Detailed review of medical records (labs, imaging, vital signs), medically appropriate exam (mental status, respiratory, cardiac, skin), discussed with treatment team, counseling and educating patient, family and staff, documenting clinical information, medication management and coordination of care.     Diamond Rice, Diamond Rice [facility] Palliative Medicine Team  03/16/2024 11:20  AM  Office [phone]  Pager [phone]     Please contact Palliative Medicine Team providers via AMION for questions and concerns.

## 2024-03-17 ENCOUNTER — Inpatient Hospital Stay

## 2024-03-17 DIAGNOSIS — D72829 Elevated white blood cell count, unspecified: Secondary | ICD-10-CM

## 2024-03-17 DIAGNOSIS — E875 Hyperkalemia: Secondary | ICD-10-CM

## 2024-03-17 DIAGNOSIS — Z789 Other specified health status: Secondary | ICD-10-CM

## 2024-03-17 DIAGNOSIS — I7100 Dissection of unspecified site of aorta: Secondary | ICD-10-CM | POA: Diagnosis not present

## 2024-03-17 DIAGNOSIS — Z7189 Other specified counseling: Secondary | ICD-10-CM | POA: Diagnosis not present

## 2024-03-17 DIAGNOSIS — Z515 Encounter for palliative care: Secondary | ICD-10-CM | POA: Diagnosis not present

## 2024-03-17 DIAGNOSIS — R112 Nausea with vomiting, unspecified: Secondary | ICD-10-CM | POA: Diagnosis not present

## 2024-03-17 DIAGNOSIS — N179 Acute kidney failure, unspecified: Secondary | ICD-10-CM

## 2024-03-17 DIAGNOSIS — G9341 Metabolic encephalopathy: Secondary | ICD-10-CM | POA: Diagnosis not present

## 2024-03-17 DIAGNOSIS — E876 Hypokalemia: Secondary | ICD-10-CM

## 2024-03-17 DIAGNOSIS — Z66 Do not resuscitate: Secondary | ICD-10-CM

## 2024-03-17 LAB — URINALYSIS, COMPLETE (UACMP) WITH MICROSCOPIC
Bilirubin Urine: NEGATIVE
Glucose, UA: NEGATIVE mg/dL
Ketones, ur: NEGATIVE mg/dL
Leukocytes,Ua: NEGATIVE
Nitrite: NEGATIVE
Protein, ur: >=300 mg/dL — AB
Specific Gravity, Urine: >1.046 — ABNORMAL HIGH (ref 1.005–1.030)
pH: 5 (ref 5.0–8.0)

## 2024-03-17 LAB — RENAL FUNCTION PANEL
Albumin: 2 g/dL — ABNORMAL LOW (ref 3.5–5.0)
Anion gap: 8 (ref 5–15)
BUN: 27 mg/dL — ABNORMAL HIGH (ref 6–20)
CO2: 19 mmol/L — ABNORMAL LOW (ref 22–32)
Calcium: 8.5 mg/dL — ABNORMAL LOW (ref 8.9–10.3)
Chloride: 109 mmol/L (ref 98–111)
Creatinine, Ser: 3.99 mg/dL — ABNORMAL HIGH (ref 0.44–1.00)
GFR, Estimated: 13 mL/min — ABNORMAL LOW (ref >60)
Glucose, Bld: 103 mg/dL — ABNORMAL HIGH (ref 70–99)
Phosphorus: 5.2 mg/dL — ABNORMAL HIGH (ref 2.5–4.6)
Potassium: 5.7 mmol/L — ABNORMAL HIGH (ref 3.5–5.1)
Sodium: 136 mmol/L (ref 135–145)

## 2024-03-17 LAB — BASIC METABOLIC PANEL WITH GFR
Anion gap: 8 (ref 5–15)
BUN: 29 mg/dL — ABNORMAL HIGH (ref 6–20)
CO2: 21 mmol/L — ABNORMAL LOW (ref 22–32)
Calcium: 8.5 mg/dL — ABNORMAL LOW (ref 8.9–10.3)
Chloride: 109 mmol/L (ref 98–111)
Creatinine, Ser: 4.18 mg/dL — ABNORMAL HIGH (ref 0.44–1.00)
GFR, Estimated: 12 mL/min — ABNORMAL LOW (ref >60)
Glucose, Bld: 136 mg/dL — ABNORMAL HIGH (ref 70–99)
Potassium: 5.5 mmol/L — ABNORMAL HIGH (ref 3.5–5.1)
Sodium: 138 mmol/L (ref 135–145)

## 2024-03-17 LAB — GLUCOSE, CAPILLARY
Glucose-Capillary: 98 mg/dL (ref 70–99)
Glucose-Capillary: 127 mg/dL — ABNORMAL HIGH (ref 70–99)
Glucose-Capillary: 157 mg/dL — ABNORMAL HIGH (ref 70–99)
Glucose-Capillary: 128 mg/dL — ABNORMAL HIGH (ref 70–99)
Glucose-Capillary: 142 mg/dL — ABNORMAL HIGH (ref 70–99)
Glucose-Capillary: 211 mg/dL — ABNORMAL HIGH (ref 70–99)
Glucose-Capillary: 141 mg/dL — ABNORMAL HIGH (ref 70–99)

## 2024-03-17 LAB — CBC
HCT: 26.3 % — ABNORMAL LOW (ref 36.0–46.0)
Hemoglobin: 8.3 g/dL — ABNORMAL LOW (ref 12.0–15.0)
MCH: 24.1 pg — ABNORMAL LOW (ref 26.0–34.0)
MCHC: 31.6 g/dL (ref 30.0–36.0)
MCV: 76.2 fL — ABNORMAL LOW (ref 80.0–100.0)
Platelets: 204 K/uL (ref 150–400)
RBC: 3.45 MIL/uL — ABNORMAL LOW (ref 3.87–5.11)
RDW: 18.6 % — ABNORMAL HIGH (ref 11.5–15.5)
WBC: 20.7 K/uL — ABNORMAL HIGH (ref 4.0–10.5)
nRBC: 0.1 % (ref 0.0–0.2)

## 2024-03-17 MED ORDER — AMLODIPINE BESYLATE 5 MG PO TABS
5.0000 mg | ORAL_TABLET | Freq: Every day | ORAL | Status: DC
  Administered 2024-03-17: 5 mg
  Filled 2024-03-17: qty 1

## 2024-03-17 MED ORDER — DEXTROSE 50 % IV SOLN
1.0000 | Freq: Once | INTRAVENOUS | Status: AC
  Administered 2024-03-17: 50 mL via INTRAVENOUS
  Filled 2024-03-17: qty 50

## 2024-03-17 MED ORDER — FREE WATER
30.0000 mL | Status: DC
  Administered 2024-03-17 – 2024-03-18 (×5): 30 mL
  Administered 2024-03-18: 140 mL
  Administered 2024-03-18 – 2024-03-19 (×8): 30 mL

## 2024-03-17 MED ORDER — AMLODIPINE BESYLATE 5 MG PO TABS
5.0000 mg | ORAL_TABLET | Freq: Once | ORAL | Status: AC
  Administered 2024-03-17: 5 mg
  Filled 2024-03-17: qty 1

## 2024-03-17 MED ORDER — INSULIN ASPART 100 UNIT/ML IJ SOLN
10.0000 [IU] | Freq: Once | INTRAMUSCULAR | Status: AC
  Administered 2024-03-17: 10 [IU] via INTRAVENOUS
  Filled 2024-03-17 (×2): qty 0.1

## 2024-03-17 MED ORDER — FLUOXETINE HCL 20 MG PO CAPS
80.0000 mg | ORAL_CAPSULE | Freq: Every day | ORAL | Status: DC
  Administered 2024-03-18 – 2024-03-19 (×2): 80 mg
  Filled 2024-03-17 (×2): qty 4

## 2024-03-17 MED ORDER — SODIUM ZIRCONIUM CYCLOSILICATE 5 G PO PACK
10.0000 g | PACK | Freq: Two times a day (BID) | ORAL | Status: AC
  Administered 2024-03-17 (×2): 10 g
  Filled 2024-03-17 (×2): qty 2

## 2024-03-17 MED ORDER — HYDRALAZINE HCL 50 MG PO TABS: 50.0000 mg | ORAL_TABLET | Freq: Four times a day (QID) | ORAL | Status: DC

## 2024-03-17 MED ORDER — CLEVIDIPINE BUTYRATE 0.5 MG/ML IV EMUL
0.0000 mg/h | INTRAVENOUS | Status: DC
  Administered 2024-03-17: 2 mg/h via INTRAVENOUS
  Administered 2024-03-17: 6 mg/h via INTRAVENOUS
  Administered 2024-03-18: 4 mg/h via INTRAVENOUS
  Filled 2024-03-17 (×3): qty 50
  Filled 2024-03-17: qty 100

## 2024-03-17 MED ORDER — AMLODIPINE BESYLATE 10 MG PO TABS: 10.0000 mg | ORAL_TABLET | Freq: Every day | ORAL | Status: DC

## 2024-03-17 MED ORDER — THIAMINE HCL 100 MG PO TABS
100.0000 mg | ORAL_TABLET | Freq: Every day | ORAL | Status: DC
  Administered 2024-03-18 – 2024-03-19 (×2): 100 mg
  Filled 2024-03-17 (×5): qty 1

## 2024-03-17 MED ORDER — SODIUM CHLORIDE 0.9 % IV SOLN: 1.0000 g | INTRAVENOUS | Status: DC

## 2024-03-17 MED ORDER — CLEVIDIPINE BUTYRATE 0.5 MG/ML IV EMUL
0.0000 mg/h | INTRAVENOUS | Status: DC
  Filled 2024-03-17: qty 50

## 2024-03-17 MED ORDER — HYDRALAZINE HCL 50 MG PO TABS
50.0000 mg | ORAL_TABLET | Freq: Four times a day (QID) | ORAL | Status: DC
  Administered 2024-03-18: 50 mg
  Filled 2024-03-17: qty 1

## 2024-03-17 MED ORDER — PIPERACILLIN-TAZOBACTAM 3.375 G IVPB
3.3750 g | Freq: Two times a day (BID) | INTRAVENOUS | Status: DC
  Administered 2024-03-17 – 2024-03-19 (×6): 3.375 g via INTRAVENOUS
  Filled 2024-03-17 (×6): qty 50

## 2024-03-17 MED ORDER — AMLODIPINE BESYLATE 10 MG PO TABS
10.0000 mg | ORAL_TABLET | Freq: Every day | ORAL | Status: DC
  Administered 2024-03-18 – 2024-03-19 (×2): 10 mg
  Filled 2024-03-17 (×2): qty 1

## 2024-03-17 MED ORDER — METOPROLOL TARTRATE 25 MG/10 ML ORAL SUSPENSION
50.0000 mg | Freq: Two times a day (BID) | ORAL | Status: DC
  Administered 2024-03-18: 50 mg
  Filled 2024-03-17: qty 20

## 2024-03-17 MED ORDER — THIAMINE HCL 100 MG PO TABS: 100.0000 mg | ORAL_TABLET | Freq: Every day | ORAL | Status: DC

## 2024-03-17 MED ORDER — FLUOXETINE HCL 20 MG PO CAPS
80.0000 mg | ORAL_CAPSULE | Freq: Every day | ORAL | Status: DC
Start: 2024-03-18 — End: 2024-03-17

## 2024-03-17 MED ORDER — SODIUM BICARBONATE 8.4 % IV SOLN
50.0000 meq | Freq: Once | INTRAVENOUS | Status: AC
  Administered 2024-03-17: 50 meq via INTRAVENOUS
  Filled 2024-03-17: qty 50

## 2024-03-17 MED ORDER — HYDRALAZINE HCL 50 MG PO TABS
25.0000 mg | ORAL_TABLET | Freq: Four times a day (QID) | ORAL | Status: DC
  Administered 2024-03-17 (×2): 25 mg
  Filled 2024-03-17 (×2): qty 1

## 2024-03-17 MED ORDER — HYDROCODONE-ACETAMINOPHEN 5-325 MG PO TABS
1.0000 | ORAL_TABLET | Freq: Four times a day (QID) | ORAL | Status: DC | PRN
  Administered 2024-03-18: 1
  Filled 2024-03-17: qty 2

## 2024-03-17 MED ORDER — METOPROLOL TARTRATE 25 MG/10 ML ORAL SUSPENSION: 50.0000 mg | Freq: Two times a day (BID) | ORAL | Status: DC

## 2024-03-17 MED ORDER — POLYETHYLENE GLYCOL 3350 17 G PO PACK
17.0000 g | PACK | Freq: Every day | ORAL | Status: DC | PRN
  Filled 2024-03-17: qty 1

## 2024-03-17 MED ORDER — DIAZEPAM 5 MG/ML IJ SOLN
2.5000 mg | INTRAMUSCULAR | Status: DC | PRN
  Administered 2024-03-17 – 2024-03-20 (×6): 2.5 mg via INTRAVENOUS
  Filled 2024-03-17 (×6): qty 2

## 2024-03-17 MED ORDER — METOPROLOL TARTRATE 25 MG/10 ML ORAL SUSPENSION
25.0000 mg | Freq: Two times a day (BID) | ORAL | Status: DC
  Administered 2024-03-17: 25 mg
  Filled 2024-03-17 (×2): qty 10

## 2024-03-17 MED ORDER — JUVEN PO PACK
1.0000 | PACK | Freq: Two times a day (BID) | ORAL | Status: DC
  Administered 2024-03-18 – 2024-03-19 (×3): 1

## 2024-03-17 MED ORDER — FLUOXETINE HCL 20 MG PO CAPS: 80.0000 mg | ORAL_CAPSULE | Freq: Every day | ORAL | Status: DC

## 2024-03-17 MED ORDER — NEPRO/CARBSTEADY PO LIQD
1000.0000 mL | ORAL | Status: DC
  Administered 2024-03-18 – 2024-03-19 (×3): 1000 mL

## 2024-03-17 MED ORDER — HYDRALAZINE HCL 50 MG PO TABS
50.0000 mg | ORAL_TABLET | Freq: Four times a day (QID) | ORAL | Status: DC
  Administered 2024-03-17: 50 mg via ORAL
  Filled 2024-03-17: qty 1

## 2024-03-17 MED ORDER — METOPROLOL TARTRATE 25 MG/10 ML ORAL SUSPENSION
50.0000 mg | Freq: Two times a day (BID) | ORAL | Status: DC
  Administered 2024-03-17: 50 mg
  Filled 2024-03-17: qty 20

## 2024-03-17 MED ORDER — JUVEN PO PACK: 1.0000 | PACK | Freq: Two times a day (BID) | ORAL | Status: DC

## 2024-03-17 NOTE — TOC Progression Note (Signed)
 Transition of Care [facility]) - Progression Note    Patient Details  Name: Diamond Rice MRN: [id number] Date of Birth: 1971-08-01  Transition of Care [facility]) CM/SW Contact  Marinda Cooks, RN Phone Number: 03/17/2024, 2:22 PM  Clinical Narrative:    Pt HOD#3 admitted for dysphagia and Type A Aortic Dissection . Per MD rounds pt is medically declining. Medical Teams  continues to attempt to reach [facility] to discuss goals of care. TOC will cont to follow dc planning/ care coordination and update as applicable.       Expected Discharge Plan and Services    TBD     Social Determinants of Health (SDOH) Interventions SDOH Screenings   Food Insecurity: No Food Insecurity (03/16/2024)  Housing: Unknown (03/16/2024)  Transportation Needs: No Transportation Needs (03/16/2024)  Utilities: Not At Risk (03/16/2024)  Alcohol  Screen: Medium Risk (01/11/2018)  Depression (PHQ2-9): Medium Risk (10/27/2020)  Financial Resource Strain: Medium Risk (08/02/2023)   Received from [facility]  Tobacco Use: High Risk (03/14/2024)    Readmission Risk Interventions     No data to display

## 2024-03-17 NOTE — Consult Note (Signed)
 Consultation Note Date: 03/17/2024 at 1400  [name]  DOB: 28-Dec-1970  MRN: [id number]  Age / Sex: 53 y.o., female  PCP: [facility] Referring Physician: Isaiah Scrivener, MD  HPI/Patient Profile: 53 y.o. female  with past medical history significant for achalasia, esophageal impactions, polysubstance use, CVA with right hemiparesis, bedbound status, HTN, COPD, DM type II and morbid obesity. Patient presented to ED from [facility] 03/14/2024 c/o acute onset N/V, CP and abdominal pain.   ED workup showed labs significant for K+ 2.9, glucose 261, BUN 9, creatinine 1.4, WBC 11.6 and Hgb 10.1. ED vitals BP 157/61, HR70, RR 18, SpO2 98% RA and 98.4 F. CT CAP revealed:   1. DeBakey type I aortic dissection with absent contrast opacification within the false lumen distal to the posterior arch, greater than the opacified true lumen. No dissection flap identified extending into the arch vessels. 2. Nonocclusive Dissection flap extends into the proximal celiac artery 3. The left renal artery appears to arise off the false lumen with absent contrast opacification signs of left renal infarct involving the entire upper pole and portions of the inferior pole. 4. Dilated and patulous esophagus, possibly related to chronic dysmotility. 5. Mild aneurysmal dilatation of the ascending thoracic aorta measuring 4 cm. 6. Aortic atherosclerosis and coronary artery calcifications   EDP contacted vascular surgery who recommended transfer to tertiary center due to surgical options not available at [facility] for type a dissection.  Telephone conference with Dr. Josepha, patient, patient's sister and Dr. Coletta of [facility] cardiothoracic surgery was held with the patient being deemed very poor surgical candidate with recommendation of medical management with consideration of focusing on comfort care.   She  was started on esmolol  and nicardipine  drip and admitted to [facility] for management of type a aortic dissection.   Palliative was consulted to assist with GOC conversations.  Clinical Assessment and Goals of Care:  Ill-appearing, morbidly obese female resting in bed. She opens eyes to verbal stimuli but immediately falls back to sleep. He is unable to participate in meaningful conversation or answer questions appropriately. Even, unlabored respirations. She is in no distress  Extensive chart review completed prior to meeting patient including labs, vital signs, imaging, progress notes, orders, and available advanced directive documents from current and previous encounters.    After numerous attempts from PMT and ICU to contact family, I spoke with patient's brother Diamond today to discuss diagnosis prognosis, GOC, EOL wishes, disposition and options. [name] shares after days of trying to contact patient's stepsister he has been unsuccessful as well.  I introduced Palliative Medicine as specialized medical care for people living with serious illness. It focuses on providing relief from the symptoms and stress of a serious illness. The goal is to improve quality of life for both the patient and the family.  We discussed a brief life review of the patient. Diamond shares that his sister never married or had children.  Her last known occupation was with [employer] years ago.  Most  of the patient's family is deceased with Diamond (brother) and Diamond Rice) being last remaining close relatives.  As far as functional and nutritional status, patient was able to feed and bathe herself as well.  Her brother shares that approximately a year ago she was able to walk at her facility.  We discussed patient's current illness and what it means in the larger context of patient's on-going co-morbidities.  Natural disease trajectory and expectations at EOL were discussed.  Patient's brother shares that he  understands her kidneys are shutting down and her brain as well after speaking with ICU MD.  He states it does not seem like there is much else that can be done.  The difference between aggressive medical intervention and comfort care was considered in light of the patient's goals of care.   Advance directives, concepts specific to code status, artificial feeding and hydration, and rehospitalization were considered and discussed.  Diamond confirms his sister is a DO NOT RESUSCITATE.  Education offered regarding concept specific to human mortality and the limitations of medical interventions to prolong life when the body begins to fail to thrive.    Family is facing treatment option decisions, advanced directive, and anticipatory care needs.  Discussed patient is unresponsive, has increased work of breathing, concern for brain damage from stroke, evidence of renal failure, minimal chance of recovery and appears to be in the dying process. Patient's brother verbalizes understanding that sister is dying and wants to do what is best.  Educated difference between continuing medical interventions versus transition to comfort care measures. I explained comfort care as care where the patient would no longer receive aggressive medical interventions such as continuous vital signs, lab work, radiology testing, or medications not focused on comfort, peace, and dignity. This includes stopping antibiotics and weaning oxygen  to room air, as these are generally not accepted as providing comfort but only prolonging the dying process artificially. All care would focus on how the patient is looking and feeling. This would include management of any symptoms that may cause discomfort, pain, shortness of breath/air hunger, increased work of breathing, cough, nausea, agitation/restlessness, anxiety, and/or secretions etc. Symptoms would be managed with medications and other non-pharmacological interventions such as spiritual  support if requested, repositioning, music therapy, or therapeutic listening. [name] verbalized understanding but is not ready to make decision about comfort care at this time.   Patient's brother is attempting to find transportation so he may visit his sister.  He wants time before deciding to transition to comfort care.  He shares he will talk with ICU MD at that visit.    Discussed with patient/family the importance of continued conversation with family and the medical providers regarding overall plan of care and treatment options, ensuring decisions are within the context of the patient's values and GOCs.    Questions and concerns were addressed. The family was encouraged to call with questions or concerns.   Primary Decision Maker NEXT OF KIN Diamond Rice, brother  Physical Exam Vitals reviewed.  Constitutional:      General: She is not in acute distress.    Appearance: She is obese. She is ill-appearing.  HENT:     Head: Normocephalic and atraumatic.     Nose:     Comments: NG Pulmonary:     Effort: Pulmonary effort is normal. No respiratory distress.  Musculoskeletal:     Right lower leg: No edema.     Left lower leg: No edema.  Skin:    General:  Skin is warm and dry.   Recommendations/Plan: Continue DNR/DNI status as previously documented    Continue current supportive interventions Recommendation for comfort care due to extremely poor prognosis  Palliative Assessment/Data: 10%   Discussed plan of care with [name], CCM-NP and Macario, RN.  Thank you for this consult. Palliative medicine will continue to follow and assist holistically.   Time Total: 65 minutes  Time spent includes: Detailed review of medical records (labs, imaging, vital signs), medically appropriate exam (mental status, respiratory, cardiac, skin), discussed with treatment team, counseling and educating patient, family and staff, documenting clinical information, medication management and coordination  of care.     Devere Sacks, AMANDA [facility] Palliative Medicine Team  03/17/2024 11:46 AM  Office [phone]  Pager [phone]     Please contact Palliative Medicine Team providers via AMION for questions and concerns.

## 2024-03-17 NOTE — Plan of Care (Addendum)
 error

## 2024-03-17 NOTE — Progress Notes (Signed)
 Larita Robert, RN from [employer], contacted listed on chart, at bedside along with 4 other employees from [employer]. All voiced that they believe Diamond Rice would want to be made comfortable at her home at [facility] rehab. Her room is still available. They plan to reach out to patient's brother to help facilitate goals of care.

## 2024-03-17 NOTE — Progress Notes (Addendum)
 NAME:  Diamond Rice, MRN:  [id number], DOB:  [birth year], LOS: 3 /ADMISSION DATE:  03/14/2024, CONSULTATION DATE:  03/14/2024 REFERRING MD:  Dr. Cyrena, CHIEF COMPLAINT:  Chest pain   Brief Pt Description / Synopsis:  53 y.o. female admitted with Type A Aortic Dissection, deemed to be very poor surgical candidate for surgical repair by both [facility] and [facility].  Course complicated by worsening AKI and Acute Metabolic Encephalopathy.  History of Present Illness:  Diamond Rice is a 53 y.o. female  with a past medical history of achalasia, esophageal impactions, polysubstance use, stroke with right hemiparesis (bed bound), hypertension, COPD, diabetes mellitus, and morbid obesity who presents from her skilled nursing facility to the emergency department on 03/14/2024 with complaints of acute onset nausea/vomiting/chest pain and abdominal pain.  She reported she had vomited several times with associated discomfort in her chest/abdomen/upper neck.  She denies dizziness, palpitations, shortness of breath, diarrhea, melena, hematochezia, dysuria, fever or chills.  Of note she was recently admitted at [facility] from 03/05/2024 through 03/06/2024 with Esophageal Obstruction due to food impaction and E. Coli UTI.   ED Course: Initial Vital Signs: Temperature 98.4 F orally, respiratory rate 18, pulse 70, blood pressure 157/61, SpO2 98% on room air Significant Labs: Potassium 2.9, glucose 261, BUN 9, creatinine 1.4, WBC 11.6, hemoglobin 10.1 Imaging CT Chest/Abdomen/Pelvis w contrast>>IMPRESSION: 1. DeBakey type I aortic dissection with absent contrast opacification within the false lumen distal to the posterior arch, greater than the opacified true lumen. No dissection flap identified extending into the arch vessels. 2. Nonocclusive Dissection flap extends into the proximal celiac artery 3. The left renal artery appears to arise off the false lumen with absent contrast opacification signs  of left renal infarct involving the entire upper pole and portions of the inferior pole. 4. Dilated and patulous esophagus, possibly related to chronic dysmotility. 5. Mild aneurysmal dilatation of the ascending thoracic aorta measuring 4 cm. 6. Aortic atherosclerosis and coronary artery calcifications Medications Administered: 20 mg hydralazine , IV Dilaudid ,, IV Zofran  ,esmolol  drip initiated  ED provider Dr. Cyrena discussed with Dr. Jama of vascular surgery, and recommended transfer to tertiary center as unable to opt for surgical options here at [facility] for type a dissection.  Telephone conference was held with Dr. Josepha, the patient, patient's sister, and Dr. Coletta of [facility] cardiothoracic surgery.  She was deemed to be a very poor surgical candidate with [facility]'s recommendation for medical medical management including impulse control and considering focusing just on comfort.  The patient and family agreed for admission here at [facility] for medical management understanding very high risk of mortality.  PCCM asked to admit for further workup and treatment.  Please see Significant Hospital Events section below for full detailed hospital course.   Pertinent  Medical History   Past Medical History:  Diagnosis Date   Acute ischemic stroke (HCC) 12/27/2020   Acute renal failure (ARF) (HCC) 04/02/2018   Anxiety    Chronic lower back pain 04/17/2012   just got over some; catched when I walked   COPD (chronic obstructive pulmonary disease) (HCC)    Critical lower limb ischemia (HCC) 05/27/2018   Depression    Headache(784.0) 04/17/2012   ~ qod; lately waking up in am w/one   High cholesterol    Hypertension    Migraines 04/17/2012   Obesity    Sleep apnea    CPAP   Stroke (HCC)    Type II diabetes mellitus (HCC) 08/28/2002  Micro Data:  7/18: MRSA PCR>>negative  Antimicrobials:   Anti-infectives (From admission, onward)    None       Significant Hospital Events: Including  procedures, antibiotic start and stop dates in addition to other pertinent events   7/18: Found to have Type A Aortic Dissection, Vascular Surgery consulted, recommended transfer to tertiary care center as unable to offer surgical intervention here at [facility].  Deemed to be very poor surgical candidate by [facility].  Recommends medically treating and allow time for outcomes knowing high risk for mortality.  PCCM asked to admit, code Status changed to DNR/DNI. 03/15/24- patient with AKI today on ACE inhibitor which we will dc.  Her BP is better controlled this am.  We have palliative care following now due to non surgical cancidate with severe aortic dissection.  03/16/24- patient sleeping this am, weaning off precedex .  Remains on room air.  Renal impairment worse.  Prognosis is poor with non operable aortic dissection. On Esmolol  drip. We reached out to [facility] cardiothoracic surgery and I personally spoke with surgery attending.  After review of case it appears that due to her hemiparesis and bedbound status with comorbidities she is a non operable candidate.  [facility] cardiothroacic surgery also evaluated her case when she first arrived in ED and also stated she was not a surgical candidate. Palliative care is on case now.  03/17/24: No significant events overnight, afebrile, remains on Esmolol  and Cleviprex  infusions for BP control.  Precedex  is off, Remains Encephalopathic, will obtain MRI Brain.  NG tube placed to be able to start PO antihypertensives in attempt to wean IV infusions.  With worsening AKI and hyperkalemia, shifting measures given.  With worsening Leukocytosis, will obtain UA while placing foley.  Interim History / Subjective:  As outlined above under Significant Hospital Events section  Objective   Blood pressure (!) 101/57, pulse 88, temperature 98.1 F (36.7 C), temperature source Axillary, resp. rate 13, height 5' 1 (1.549 m), weight 125.4 kg, last menstrual period 04/26/2018,  SpO2 96%.        Intake/Output Summary (Last 24 hours) at 03/17/2024 0746 Last data filed at 03/17/2024 0700 Gross per 24 hour  Intake 2158.01 ml  Output --  Net 2158.01 ml   Filed Weights   03/14/24 1110 03/16/24 0247 03/17/24 0500  Weight: 118.1 kg 117.9 kg 125.4 kg    Examination: General: Acute on chronically ill-appearing obese female, laying in bed, in no acute distress HENT: Atraumatic, normal cephalic, neck supple, no JVD Lungs: Clear breath sounds, even, nonlabored Cardiovascular: Regular rate and rhythm, S1-S2, murmurs, rubs, gallops Abdomen: Obese, soft, tender to palpation, no guarding or rebound tenderness, bowel sounds positive x 4 Extremities: Generalized weakness (right-sided hemiparesis at baseline from previous stroke), no cyanosis, warm extremities Neuro: Somnolent, arouses to voice/gentle stimulation, will track biefly, but doses off, unable to follow commands, pupils PERRLA GU: Deferred   Resolved Hospital Problem list     Assessment & Plan:   #Type A Aortic Dissection PMHx: HTN, HLD, stroke with right hemiparesis -Continuous cardiac monitoring -Maintain HR <60 & SBP 100-120 -Esmolol  and Nicardipine  drips as needed  -Prn Hydralazine  -Will place NG Tube in order to start scheduled Metoprolol  and Norvasc  -Pain control -Vascular Surgery consulted, appreciate input ~ no surgical intervention available here at [facility] -Case has been discussed with both [facility] & [facility]~ deemed to be VERY POOR SURGICAL CANDIDATE and refused transfer ~ recommended medical management  #COPD without acute exacerbation PMHx: OSA, morbid  obesity -Supplemental O2 as needed to maintain O2 sats 88 to 92% -Follow intermittent Chest X-ray & ABG as needed -Bronchodilators prn -Pulmonary toilet as able  #Acute Kidney Injury #Hyperkalemia -Monitor I&O's / urinary output -Follow BMP -Ensure adequate renal perfusion -Avoid nephrotoxic agents as able -Replace  electrolytes as indicated ~ Pharmacy following for assistance with electrolyte replacement -Will give shifting measures: insulin , bicarb, Lokelma   #Anemia -Monitor for S/Sx of bleeding -Trend CBC -SCD's for VTE Prophylaxis  -Transfuse for Hgb <7  #Leukocytosis PMHx: recent E. Coli UTI 03/05/24 -Monitor fever curve -Trend WBC's  -Follow cultures as above -Will hold off on empiric ABX as this time pending cultures & sensitivities -Check UA  #Diabetes Mellitus Type II -CBG's q4h; Target range of 140 to 180 -SSI -Follow ICU Hypo/Hyperglycemia protocol  #Acute Metabolic Encephalopathy PMHx: CVA with right sided hemiparesis -Treatment of metabolic derangements and pain as outlined above -Provide supportive care -Promote normal sleep/wake cycle and family presence -Avoid sedating medications as able -Obtain MRI Brain     Pt is critically ill with multiorgan failure. Prognosis is guarded, high risk for further decompensation, cardiac arrest, and death.  Given current critical illness with inoperable Type A Aortic Dissection, superimposed on multiple chronic co-morbidities and advanced age, overall long term prognosis is poor.  Pt is DNR/DNI status, recommend transition to comfort measures. Palliative Care following to assist with GOC discussions.   Best Practice (right click and Reselect all SmartList Selections daily)   Diet/type: NPO until mental status improved, start tube feeds DVT prophylaxis: SCD GI prophylaxis: N/A Lines: Central line and yes and it is still needed Foley:  will place due to urinary retention Code Status:  DNR Last date of multidisciplinary goals of care discussion [03/17/24]  7/21: Will attempt to update pts' family via telephone on plan of care.  Labs   CBC: Recent Labs  Lab 03/14/24 0418 03/15/24 0626 03/16/24 0804 03/17/24 0422  WBC 11.6* 10.7* 17.9* 20.7*  NEUTROABS 8.4*  --   --   --   HGB 10.1* 9.6* 9.3* 8.3*  HCT 32.5* 30.5* 29.7* 26.3*   MCV 76.5* 76.8* 75.8* 76.2*  PLT 276 225 249 204    Basic Metabolic Panel: Recent Labs  Lab 03/14/24 0418 03/15/24 0626 03/16/24 0804 03/16/24 1428 03/16/24 2124 03/17/24 0422  NA 141 140 135 138 139 136  K 2.9* 4.4 5.7* 5.5* 5.7* 5.7*  CL 103 112* 109 111 110 109  CO2 28 23 16* 18* 19* 19*  GLUCOSE 261* 121* 125* 153* 124* 103*  BUN 9 15 22* 25* 25* 27*  CREATININE 1.41* 2.22* 3.16* 3.57* 3.77* 3.99*  CALCIUM  9.3 8.6* 8.9 8.6* 8.5* 8.5*  MG 2.0 1.9  --   --   --   --   PHOS 2.8 3.4 4.8*  --   --  5.2*   GFR: Estimated Creatinine Clearance: 20.8 mL/min (A) (by C-G formula based on SCr of 3.99 mg/dL (H)). Recent Labs  Lab 03/14/24 0418 03/15/24 0626 03/16/24 0804 03/17/24 0422  WBC 11.6* 10.7* 17.9* 20.7*    Liver Function Tests: Recent Labs  Lab 03/14/24 0418 03/15/24 0626 03/16/24 0804 03/17/24 0422  AST 16  --   --   --   ALT 14  --   --   --   ALKPHOS 95  --   --   --   BILITOT 0.5  --   --   --   PROT 7.5  --   --   --  ALBUMIN 3.0* 2.5* 2.2* 2.0*   Recent Labs  Lab 03/14/24 0418  LIPASE 30   No results for input(s): AMMONIA in the last 168 hours.  ABG    Component Value Date/Time   PHART 7.487 (H) 12/06/2019 1802   PCO2ART 32.6 12/06/2019 1802   PO2ART 69.0 (L) 12/06/2019 1802   HCO3 24.7 12/06/2019 1802   TCO2 28 12/27/2020 1755   O2SAT 95.0 12/06/2019 1802     Coagulation Profile: No results for input(s): INR, PROTIME in the last 168 hours.  Cardiac Enzymes: No results for input(s): CKTOTAL, CKMB, CKMBINDEX, TROPONINI in the last 168 hours.  HbA1C: Hgb A1c MFr Bld  Date/Time Value Ref Range Status  03/04/2024 10:37 PM 8.6 (H) 4.8 - 5.6 % Final    Comment:    (NOTE)         Prediabetes: 5.7 - 6.4         Diabetes: >6.4         Glycemic control for adults with diabetes: <7.0   06/01/2022 05:05 AM 7.1 (H) 4.8 - 5.6 % Final    Comment:    (NOTE) Pre diabetes:          5.7%-6.4%  Diabetes:               >6.4%  Glycemic control for   <7.0% adults with diabetes     CBG: Recent Labs  Lab 03/16/24 1119 03/16/24 1626 03/16/24 1931 03/16/24 2346 03/17/24 0334  GLUCAP 138* 152* 138* 122* 98    Review of Systems:   Unable to assess due to AMS   Past Medical History:  She,  has a past medical history of Acute ischemic stroke (HCC) (12/27/2020), Acute renal failure (ARF) (HCC) (04/02/2018), Anxiety, Chronic lower back pain (04/17/2012), COPD (chronic obstructive pulmonary disease) (HCC), Critical lower limb ischemia (HCC) (05/27/2018), Depression, Headache(784.0) (04/17/2012), High cholesterol, Hypertension, Migraines (04/17/2012), Obesity, Sleep apnea, Stroke (HCC), and Type II diabetes mellitus (HCC) (08/28/2002).   Surgical History:   Past Surgical History:  Procedure Laterality Date   ABDOMINAL AORTOGRAM W/LOWER EXTREMITY Left 05/27/2018   Procedure: ABDOMINAL AORTOGRAM W/LOWER EXTREMITY Runoff and Possible Intervention;  Surgeon: Sheree Penne Bruckner, MD;  Location[facility] INVASIVE CV LAB;  Service: Cardiovascular;  Laterality: Left;   APPLICATION OF WOUND VAC Left 06/06/2018   Procedure: APPLICATION OF WOUND VAC;  Surgeon: Sheree Penne Bruckner, MD;  Location: [facility] OR;  Service: Vascular;  Laterality: Left;   BALLOON DILATION N/A 12/31/2020   Procedure: MERRILL HODGKIN;  Surgeon: Saintclair Jasper, MD;  Location: [facility] ENDOSCOPY;  Service: Gastroenterology;  Laterality: N/A;   BIOPSY  04/04/2018   Procedure: BIOPSY;  Surgeon: Charlanne Groom, MD;  Location: [facility] ENDOSCOPY;  Service: Endoscopy;;   BIOPSY  10/27/2020   Procedure: BIOPSY;  Surgeon: Burnette Fallow, MD;  Location: [facility] ENDOSCOPY;  Service: Endoscopy;;   BOTOX  INJECTION N/A 12/31/2020   Procedure: BOTOX  INJECTION;  Surgeon: Saintclair Jasper, MD;  Location: [facility] ENDOSCOPY;  Service: Gastroenterology;  Laterality: N/A;   CARDIAC CATHETERIZATION  ~ 2011   COLONOSCOPY N/A 04/04/2018   Procedure: COLONOSCOPY;  Surgeon: Charlanne Groom, MD;  Location: [facility]  ENDOSCOPY;  Service: Endoscopy;  Laterality: N/A;   COLONOSCOPY WITH PROPOFOL  N/A 10/27/2020   Procedure: COLONOSCOPY WITH PROPOFOL ;  Surgeon: Burnette Fallow, MD;  Location: [facility] ENDOSCOPY;  Service: Endoscopy;  Laterality: N/A;   ESOPHAGOGASTRODUODENOSCOPY N/A 12/31/2020   Procedure: ESOPHAGOGASTRODUODENOSCOPY (EGD);  Surgeon: Saintclair Jasper, MD;  Location: [facility] ENDOSCOPY;  Service: Gastroenterology;  Laterality: N/A;   ESOPHAGOGASTRODUODENOSCOPY  N/A 03/05/2024   Procedure: EGD (ESOPHAGOGASTRODUODENOSCOPY);  Surgeon: Jinny Carmine, MD;  Location: [facility] ENDOSCOPY;  Service: Endoscopy;  Laterality: N/A;   ESOPHAGOGASTRODUODENOSCOPY (EGD) WITH PROPOFOL  N/A 10/27/2020   Procedure: ESOPHAGOGASTRODUODENOSCOPY (EGD) WITH PROPOFOL   WITH POSSIBLE DIL;  Surgeon: Burnette Fallow, MD;  Location: [facility] ENDOSCOPY;  Service: Endoscopy;  Laterality: N/A;   ESOPHAGOGASTRODUODENOSCOPY (EGD) WITH PROPOFOL  N/A 10/19/2021   Procedure: ESOPHAGOGASTRODUODENOSCOPY (EGD) WITH PROPOFOL ;  Surgeon: Unk Corinn Skiff, MD;  Location: [facility] ENDOSCOPY;  Service: Gastroenterology;  Laterality: N/A;   ESOPHAGOGASTRODUODENOSCOPY (EGD) WITH PROPOFOL  N/A 06/02/2022   Procedure: ESOPHAGOGASTRODUODENOSCOPY (EGD) WITH PROPOFOL ;  Surgeon: Therisa Bi, MD;  Location: [facility] ENDOSCOPY;  Service: Gastroenterology;  Laterality: N/A;   FOREIGN BODY REMOVAL  12/31/2020   Procedure: FOREIGN BODY REMOVAL;  Surgeon: Saintclair Jasper, MD;  Location: [facility] ENDOSCOPY;  Service: Gastroenterology;;   LOWER EXTREMITY ANGIOGRAM Left 05/27/2018   PERIPHERAL VASCULAR INTERVENTION Left 05/27/2018   Procedure: PERIPHERAL VASCULAR INTERVENTION;  Surgeon: Sheree Penne Bruckner, MD;  Location: [facility] INVASIVE CV LAB;  Service: Cardiovascular;  Laterality: Left;   POLYPECTOMY  04/04/2018   Procedure: POLYPECTOMY;  Surgeon: Charlanne Groom, MD;  Location: [facility] ENDOSCOPY;  Service: Endoscopy;;   POLYPECTOMY  10/27/2020   Procedure: POLYPECTOMY;  Surgeon: Burnette Fallow, MD;  Location: [facility] ENDOSCOPY;   Service: Endoscopy;;   TRANSMETATARSAL AMPUTATION Left 05/28/2018   Procedure: AMPUTATION TOES THREE, FOUR AND FIVE on left foot;  Surgeon: Sheree Penne Bruckner, MD;  Location: [facility] OR;  Service: Vascular;  Laterality: Left;   WOUND DEBRIDEMENT Left 06/06/2018   Procedure: DEBRIDEMENT WOUND LEFT FOOT;  Surgeon: Sheree Penne Bruckner, MD;  Location: [facility] OR;  Service: Vascular;  Laterality: Left;     Social History:   reports that she has been smoking cigarettes. She has a 7 pack-year smoking history. She has never used smokeless tobacco. She reports current drug use. Drugs: Marijuana and Crack cocaine. She reports that she does not drink alcohol .   Family History:  Her family history includes Heart attack (age of onset: 24) in her mother; Stroke (age of onset: 72) in her brother and brother; Stroke (age of onset: 57) in her father.   Allergies Allergies  Allergen Reactions   Morphine  And Codeine Hives   Metformin  Diarrhea and Other (See Comments)    Allergic, per [facility]     Home Medications  Prior to Admission medications   Medication Sig Start Date End Date Taking? Authorizing Provider  acetaminophen  (TYLENOL ) 500 MG tablet Take 500 mg by mouth 3 (three) times daily.    [provider]  aspirin  EC 81 MG EC tablet Take 1 tablet (81 mg total) by mouth daily. 09/21/18   Noemi Reena CROME, NP  atorvastatin  (LIPITOR ) 40 MG tablet Take 2 tablets (80 mg total) by mouth daily at 6 PM. 01/03/21   Hongalgi, Anand D, MD  baclofen  (LIORESAL ) 10 MG tablet Take 5 mg by mouth 3 (three) times daily.    [provider]  bisacodyl  (DULCOLAX) 10 MG suppository Place 10 mg rectally as needed for moderate constipation.    [provider]  carvedilol  (COREG ) 25 MG tablet Take 25 mg by mouth 2 (two) times daily with a meal.    [provider]  cloNIDine  (CATAPRES  - DOSED IN MG/24 HR) 0.3 mg/24hr patch 0.3 mg once a week. 11/08/21   [provider]  famotidine  (PEPCID) 40 MG tablet Take 40 mg by mouth daily. 12/15/21   [provider]  feeding supplement (ENSURE ENLIVE / ENSURE  PLUS) LIQD Take 237 mLs by mouth 2 (two) times daily between meals. Patient not taking: Reported on 09/20/2023 06/05/22   Caleen Qualia, MD  ferrous sulfate  220 (44 Fe) MG/5ML solution Take 7 mLs by mouth every Monday, Wednesday, and Friday.    [provider]  FLUoxetine  (PROZAC ) 40 MG capsule Take 80 mg by mouth daily. 12/26/21   [provider]  GVOKE HYPOPEN  1-PACK 1 MG/0.2ML SOAJ Inject 1 mg into the skin as needed. 03/03/24   [provider]  HUMULIN R  100 UNIT/ML injection Inject 0-9 Units into the skin 3 (three) times daily before meals. 12/25/23   [provider]  LANTUS  SOLOSTAR 100 UNIT/ML Solostar Pen Inject 15 Units into the skin at bedtime. 03/07/23   [provider]  lidocaine  (LMX) 4 % cream Apply 1 Application topically daily.    [provider]  magnesium  hydroxide (MILK OF MAGNESIA) 400 MG/5ML suspension Take by mouth daily as needed for mild constipation.    [provider]  melatonin 5 MG TABS Take 5 mg by mouth at bedtime.    [provider]  nitroGLYCERIN  (NITROSTAT ) 0.4 MG SL tablet Place 0.4 mg under the tongue every 5 (five) minutes as needed for chest pain.    [provider]  oxyCODONE  (ROXICODONE ) 5 MG/5ML solution Take 5 mLs by mouth every 8 (eight) hours as needed for moderate pain (pain score 4-6) or severe pain (pain score 7-10).    [provider]  pantoprazole  sodium (PROTONIX ) 40 mg Take 40 mg by mouth daily.    [provider]  polyethylene glycol (MIRALAX  / GLYCOLAX ) 17 g packet Take 17 g by mouth every Monday, Wednesday, and Friday.    [provider]  Potassium Chloride  10 % SOLN Take 15 mLs by mouth daily.    [provider]  promethazine  (PHENERGAN ) 25 MG/ML injection Inject 25 mg into the muscle as needed for nausea or  vomiting. 05/25/22   [provider]  Semaglutide, 1 MG/DOSE, 2 MG/1.5ML SOPN Inject 2 mg into the skin every Wednesday. 03/12/23   [provider]  senna (SENOKOT) 8.6 MG tablet Take 1 tablet by mouth daily.    [provider]  traZODone  (DESYREL ) 50 MG tablet Take 25 mg by mouth at bedtime.    [provider]     Critical care time: 40 minutes     [name] Lecher, AGACNP-BC [facility] Prefer epic messenger for cross cover needs If after hours, please call E-link  ICU ATTENDING ATTESTATION:  Patient seen and examined and relevant ancillary tests reviewed.   I agree with the assessment and plan of care as outlined by [name] Lecher NP.     REVIEW OF SYSTEMS  PATIENT IS UNABLE TO PROVIDE COMPLETE REVIEW OF SYSTEMS DUE TO SEVERE CRITICAL ILLNESS    BP (!) 107/49   Pulse 85   Temp 98.7 F (37.1 C) (Axillary)   Resp 16   Ht 5' 1 (1.549 m)   Wt 125.4 kg   LMP 04/26/2018 (Approximate)   SpO2 96%   BMI 52.24 kg/m   CBC    Component Value Date/Time   WBC 20.7 (H) 03/17/2024 0422   RBC 3.45 (L) 03/17/2024 0422   HGB 8.3 (L) 03/17/2024 0422   HCT 26.3 (L) 03/17/2024 0422   PLT 204 03/17/2024 0422   MCV 76.2 (L) 03/17/2024 0422   MCH 24.1 (L) 03/17/2024 0422   MCHC 31.6 03/17/2024 0422   RDW  18.6 (H) 03/17/2024 0422   LYMPHSABS 1.7 03/14/2024 0418   MONOABS 1.3 (H) 03/14/2024 0418   EOSABS 0.0 03/14/2024 0418   BASOSABS 0.0 03/14/2024 0418        Latest Ref Rng & Units 03/17/2024   12:34 PM 03/17/2024    4:22 AM 03/16/2024    9:24 PM  BMP  Glucose 70 - 99 mg/dL 863  896  875   BUN 6 - 20 mg/dL 29  27  25    Creatinine 0.44 - 1.00 mg/dL 5.81  6.00  6.22   Sodium 135 - 145 mmol/L 138  136  139   Potassium 3.5 - 5.1 mmol/L 5.5  5.7  5.7   Chloride 98 - 111 mmol/L 109  109  110   CO2 22 - 32 mmol/L 21  19  19    Calcium  8.9 - 10.3 mg/dL 8.5  8.5  8.5          Critical Care Time devoted to patient care  services described in this note is 65 Total Care Time minutes.   Overall, patient is critically ill, prognosis is guarded.  Patient with Multiorgan failure and at high risk for cardiac arrest and death.    Nickolas Alm Cellar, M.D.  [facility]  Medical Director [facility] Medical Director [facility] Cardio-Pulmonary Department

## 2024-03-17 NOTE — Progress Notes (Signed)
 Initial Nutrition Assessment  DOCUMENTATION CODES:   Morbid obesity  INTERVENTION:   Nepro @60ml /hr- Initiate at 20ml/hr and increase by 10ml/hr q 8 hours until goal rate is reached.   Free water  flushes 30ml q4 hours to maintain tube patency   Regimen provides 2592kcal/day, 117g/day protein and 1270ml/day of free water .   Pt at high refeed risk; recommend monitor potassium, magnesium  and phosphorus labs daily until stable  Daily weights   Juven Fruit Punch BID via tube, each serving provides 95kcal and 2.5g of protein (amino acids glutamine and arginine)  Thiamine  100mg  daily via tube x 7 days   NUTRITION DIAGNOSIS:   Inadequate oral intake related to acute illness as evidenced by NPO status.  GOAL:   Patient will meet greater than or equal to 90% of their needs  MONITOR:   Diet advancement, Labs, Weight trends, TF tolerance, Skin, I & O's  REASON FOR ASSESSMENT:   Consult Enteral/tube feeding initiation and management  ASSESSMENT:   53 y.o. woman with h/o CVA (with residual left sided deficits), dysarthria, COPD, DM2, HTN, obesity, DVT/PE, CKD III, achalasia s/p numerous dilations (last 2024), chronic pain, depression, mood disorder, SI, schizophrenia, HLD, substance abuse, OSA, PVD and ICH who is admitted with AMS, AKI and type A aortic dissection.  Visited pt's room today. Pt with AMS and does not provide history. NGT placed this morning (gastric). Will plan to initiate tube feeds today. Pt is at high refeed risk. Per chart, pt appears weight stable since admission. Pt is up ~16lbs since admission; pt +9.4L on her I & Os. Palliative care following; pt with poor prognosis.   Medications reviewed and include: insulin , lokelma , zosyn    Labs reviewed: K 5.5(H), BUN 29(H), creat 4.18(H), P 5.2(H) Mg 1.9 wnl- 7/19 Wbc- 20.7(H), Hgb 8.3(L), Hct 26.3(L), MCV 76.2(L), MCH 24.1(L) Cbgs- 157, 127, 98 x 24 hrs  AIC 8.6(H)- 7/8  UOP-   NUTRITION - FOCUSED PHYSICAL  EXAM:  Flowsheet Row Most Recent Value  Orbital Region No depletion  Upper Arm Region No depletion  Thoracic and Lumbar Region No depletion  Buccal Region No depletion  Temple Region No depletion  Clavicle Bone Region No depletion  Clavicle and Acromion Bone Region No depletion  Scapular Bone Region No depletion  Dorsal Hand No depletion  Patellar Region No depletion  Anterior Thigh Region No depletion  Posterior Calf Region No depletion  Edema (RD Assessment) Mild  Hair Reviewed  Eyes Reviewed  Mouth Reviewed  Skin Reviewed  Nails Reviewed   Diet Order:   Diet Order             Diet NPO time specified  Diet effective now                  EDUCATION NEEDS:   No education needs have been identified at this time  Skin:  Skin Assessment: Reviewed RN Assessment (Stage II buttocks)  Last BM:  pta  Height:   Ht Readings from Last 1 Encounters:  03/14/24 5' 1 (1.549 m)    Weight:   Wt Readings from Last 1 Encounters:  03/17/24 125.4 kg    Ideal Body Weight:  47.7 kg  BMI:  Body mass index is 52.24 kg/m.  Estimated Nutritional Needs:   Kcal:  2200-2500kcal/day  Protein:  110-125g/day  Fluid:  1.6-1.8L/day  Augustin Shams MS, RD, LDN If unable to be reached, please send secure chat to RD inpatient available from 8:00a-4:00p daily

## 2024-03-17 NOTE — Plan of Care (Signed)
  Problem: Nutrition: Goal: Adequate nutrition will be maintained Outcome: Progressing   Problem: Pain Managment: Goal: General experience of comfort will improve and/or be controlled Outcome: Progressing   Problem: Skin Integrity: Goal: Risk for impaired skin integrity will decrease Outcome: Progressing

## 2024-03-17 NOTE — Plan of Care (Signed)
  Problem: Clinical Measurements: Goal: Will remain free from infection Outcome: Progressing Goal: Respiratory complications will improve Outcome: Progressing   Problem: Pain Managment: Goal: General experience of comfort will improve and/or be controlled Outcome: Progressing   Problem: Safety: Goal: Ability to remain free from injury will improve Outcome: Progressing   Problem: Education: Goal: Knowledge of General Education information will improve Description: Including pain rating scale, medication(s)/side effects and non-pharmacologic comfort measures Outcome: Not Progressing   Problem: Clinical Measurements: Goal: Ability to maintain clinical measurements within normal limits will improve Outcome: Not Progressing Goal: Diagnostic test results will improve Outcome: Not Progressing Goal: Cardiovascular complication will be avoided Outcome: Not Progressing   Problem: Activity: Goal: Risk for activity intolerance will decrease Outcome: Not Progressing   Problem: Nutrition: Goal: Adequate nutrition will be maintained Outcome: Not Progressing   Problem: Elimination: Goal: Will not experience complications related to bowel motility Outcome: Not Progressing

## 2024-03-17 NOTE — IPAL (Addendum)
  Interdisciplinary Goals of Care Family Meeting   Date carried out: 03/17/2024  Location of the meeting: Phone conference  Member's involved: Physician, Nurse Practitioner, Bedside Registered Nurse, and Family Member or next of kin    GOALS OF CARE DISCUSSION  The Clinical status was relayed to family in detail-Diamond Rice the Brother    Updated and notified of patients medical condition- Patient remains unresponsive and will not open eyes to command.   Patient with increased WOB and using accessory muscles to breathe Explained to family course of therapy and the modalities  Patient with Progressive multiorgan failure with a very high probablity of a very minimal chance of meaningful recovery despite all aggressive and optimal medical therapy.    PATIENT REMAINS DNR/DNI status  We are very concerned of brain damage from stroke, we are concerned of kidney failure Patient is in the dying process Lytle will reach out to the step sister  Step Sistar did NOT want to make any decisions, she does NOT want to be responsible for funeral costs and things of that nature  Family are satisfied with Plan of action and management. All questions answered  Additional CC time 35 mins   Bland Rudzinski Alm Cellar, M.D.  [facility]  Medical Director [facility] Medical Director [facility] Cardio-Pulmonary Department

## 2024-03-18 ENCOUNTER — Inpatient Hospital Stay

## 2024-03-18 DIAGNOSIS — I6310 Cerebral infarction due to embolism of unspecified precerebral artery: Secondary | ICD-10-CM

## 2024-03-18 DIAGNOSIS — N390 Urinary tract infection, site not specified: Secondary | ICD-10-CM | POA: Diagnosis not present

## 2024-03-18 DIAGNOSIS — Z1612 Extended spectrum beta lactamase (ESBL) resistance: Secondary | ICD-10-CM | POA: Diagnosis not present

## 2024-03-18 DIAGNOSIS — I63419 Cerebral infarction due to embolism of unspecified middle cerebral artery: Secondary | ICD-10-CM

## 2024-03-18 DIAGNOSIS — I7100 Dissection of unspecified site of aorta: Secondary | ICD-10-CM | POA: Diagnosis not present

## 2024-03-18 DIAGNOSIS — Z7189 Other specified counseling: Secondary | ICD-10-CM | POA: Diagnosis not present

## 2024-03-18 LAB — CBC
HCT: 30 % — ABNORMAL LOW (ref 36.0–46.0)
Hemoglobin: 9.5 g/dL — ABNORMAL LOW (ref 12.0–15.0)
MCH: 23.8 pg — ABNORMAL LOW (ref 26.0–34.0)
MCHC: 31.7 g/dL (ref 30.0–36.0)
MCV: 75 fL — ABNORMAL LOW (ref 80.0–100.0)
Platelets: 255 K/uL (ref 150–400)
RBC: 4 MIL/uL (ref 3.87–5.11)
RDW: 18.5 % — ABNORMAL HIGH (ref 11.5–15.5)
WBC: 24.8 K/uL — ABNORMAL HIGH (ref 4.0–10.5)
nRBC: 0.1 % (ref 0.0–0.2)

## 2024-03-18 LAB — RENAL FUNCTION PANEL
Albumin: 2 g/dL — ABNORMAL LOW (ref 3.5–5.0)
Anion gap: 15 (ref 5–15)
BUN: 32 mg/dL — ABNORMAL HIGH (ref 6–20)
CO2: 19 mmol/L — ABNORMAL LOW (ref 22–32)
Calcium: 8.7 mg/dL — ABNORMAL LOW (ref 8.9–10.3)
Chloride: 105 mmol/L (ref 98–111)
Creatinine, Ser: 4.48 mg/dL — ABNORMAL HIGH (ref 0.44–1.00)
GFR, Estimated: 11 mL/min — ABNORMAL LOW (ref >60)
Glucose, Bld: 130 mg/dL — ABNORMAL HIGH (ref 70–99)
Phosphorus: 5.9 mg/dL — ABNORMAL HIGH (ref 2.5–4.6)
Potassium: 5.6 mmol/L — ABNORMAL HIGH (ref 3.5–5.1)
Sodium: 139 mmol/L (ref 135–145)

## 2024-03-18 LAB — MAGNESIUM: Magnesium: 2.1 mg/dL (ref 1.7–2.4)

## 2024-03-18 LAB — GLUCOSE, CAPILLARY
Glucose-Capillary: 124 mg/dL — ABNORMAL HIGH (ref 70–99)
Glucose-Capillary: 138 mg/dL — ABNORMAL HIGH (ref 70–99)
Glucose-Capillary: 142 mg/dL — ABNORMAL HIGH (ref 70–99)
Glucose-Capillary: 127 mg/dL — ABNORMAL HIGH (ref 70–99)
Glucose-Capillary: 153 mg/dL — ABNORMAL HIGH (ref 70–99)
Glucose-Capillary: 134 mg/dL — ABNORMAL HIGH (ref 70–99)

## 2024-03-18 LAB — TRIGLYCERIDES: Triglycerides: 107 mg/dL (ref <150)

## 2024-03-18 MED ORDER — POLYETHYLENE GLYCOL 3350 17 G PO PACK
17.0000 g | PACK | Freq: Every day | ORAL | Status: DC
  Administered 2024-03-19: 17 g
  Filled 2024-03-18 (×2): qty 1

## 2024-03-18 MED ORDER — DEXTROSE 50 % IV SOLN
INTRAVENOUS | Status: DC
Start: 2024-03-18 — End: 2024-03-18
  Filled 2024-03-18: qty 50

## 2024-03-18 MED ORDER — METOPROLOL TARTRATE 25 MG/10 ML ORAL SUSPENSION
50.0000 mg | Freq: Three times a day (TID) | ORAL | Status: DC
  Administered 2024-03-19 – 2024-03-20 (×4): 50 mg
  Filled 2024-03-18 (×7): qty 20

## 2024-03-18 MED ORDER — DOCUSATE SODIUM 50 MG/5ML PO LIQD
50.0000 mg | Freq: Two times a day (BID) | ORAL | Status: DC
  Administered 2024-03-18 – 2024-03-19 (×2): 50 mg
  Filled 2024-03-18 (×3): qty 10

## 2024-03-18 MED ORDER — DEXTROSE 50 % IV SOLN: 25.0000 g | INTRAVENOUS | Status: DC

## 2024-03-18 MED ORDER — HYDRALAZINE HCL 50 MG PO TABS
75.0000 mg | ORAL_TABLET | Freq: Four times a day (QID) | ORAL | Status: DC
  Administered 2024-03-18 – 2024-03-20 (×8): 75 mg
  Filled 2024-03-18 (×8): qty 2

## 2024-03-18 NOTE — Progress Notes (Addendum)
 Daily Progress Note   [name]       Date: 03/18/2024 DOB: [birth year]  Age: 53 y.o. MRN#: [id number] Attending Physician: Isadora Hose, MD Primary Care Physician: [place] Admit Date: 03/14/2024  Reason for Consultation/Follow-up: Establishing goals of care  Subjective: Notes and labs reviewed.  Diagnostics reviewed including head CT completed last night.  In to see patient.  No family at bedside.  Patient is awake.  She nods her head upon saying hello.  Discussed trying to reach her brother and she nodded her head.  She asked if she was having any pain and she nodded her head.  Asked if it was in her chest and she shook her head no, asked it was in her abdomen and she nodded yes.  Primary team made aware.  Discussed case and updates.  Attempted to call patient's brother unsuccessfully.  Length of Stay: 4  Current Medications: Scheduled Meds:   amLODipine   10 mg Per Tube Daily   Chlorhexidine  Gluconate Cloth  6 each Topical Daily   cloNIDine   0.3 mg Transdermal Weekly   docusate  50 mg Per Tube BID   FLUoxetine   80 mg Per Tube Daily   free water   30 mL Per Tube Q4H   hydrALAZINE   75 mg Per Tube Q6H   insulin  aspart  0-9 Units Subcutaneous Q4H   metoprolol  tartrate  50 mg Per Tube Q8H   nutrition supplement (JUVEN)  1 packet Per Tube BID BM   mouth rinse  15 mL Mouth Rinse 4 times per day   polyethylene glycol  17 g Per Tube Daily   thiamine   100 mg Per Tube Daily    Continuous Infusions:  feeding supplement (NEPRO CARB STEADY) 20 mL/hr at 03/18/24 1200   piperacillin -tazobactam (ZOSYN )  IV 12.5 mL/hr at 03/18/24 1200    PRN Meds: diazepam , docusate sodium , hydrALAZINE , HYDROcodone -acetaminophen , HYDROmorphone  (DILAUDID ) injection,  mouth rinse, polyethylene glycol  Physical Exam Pulmonary:     Effort: Pulmonary effort is normal.  Skin:    General: Skin is warm and dry.  Neurological:     Mental Status: She is alert.             Vital Signs: BP 101/71   Pulse 81   Temp 97.9 F (36.6 C) (Axillary)  Resp 16   Ht 5' 1 (1.549 m)   Wt 126 kg   LMP 04/26/2018 (Approximate)   SpO2 98%   BMI 52.49 kg/m  SpO2: SpO2: 98 % O2 Device: O2 Device: Room Air O2 Flow Rate: O2 Flow Rate (L/min): 3 L/min  Intake/output summary:  Intake/Output Summary (Last 24 hours) at 03/18/2024 1535 Last data filed at 03/18/2024 1220 Gross per 24 hour  Intake 1260.67 ml  Output 145 ml  Net 1115.67 ml   LBM: Last BM Date :  (pta) Baseline Weight: Weight: 118.1 kg Most recent weight: Weight: 126 kg    Patient Active Problem List   Diagnosis Date Noted   Type A aortic dissection (HCC) 03/14/2024   Hypophosphatemia 03/06/2024   Atherosclerosis of artery of extremity with intermittent claudication (HCC) 05/17/2023   Achalasia of esophagus 05/31/2022   Pain of lower extremity 12/19/2021   Achalasia 08/17/2021   Dysphagia 08/17/2021   Acute CVA (cerebrovascular accident) (HCC) 01/18/2021   Dysarthria    Urinary tract infection    Abnormal finding on lung imaging 12/27/2020   Severe episode of recurrent major depressive disorder, with psychotic features (HCC)    Cerebrovascular accident (CVA) (HCC) 12/06/2019   AKI (acute kidney injury) (HCC) 12/06/2019   LVH (left ventricular hypertrophy) 08/10/2019   Edema of lower extremity 11/26/2018   Loss of appetite 11/26/2018   Urinary incontinence 11/26/2018   Allergic rhinitis 11/14/2018   Deep venous thrombosis (HCC) 11/14/2018   Nicotine  dependence 11/14/2018   Hemorrhagic stroke (HCC)    Diastolic dysfunction    Type 2 diabetes mellitus (HCC)    Anemia of chronic disease    Chronic obstructive pulmonary disease (HCC)    ICH (intracerebral hemorrhage) (HCC) 08/30/2018    Hypertensive heart disease without heart failure 07/04/2018   Nonrheumatic aortic valve insufficiency 07/04/2018   Esophageal obstruction due to food impaction, recurrent    Class 2 severe obesity due to excess calories with serious comorbidity and body mass index (BMI) of 35.0 to 35.9 in adult [facility])    Peripheral vascular disease (HCC)    History of osteomyelitis    History of amputation of lesser toe (HCC)    Pulmonary embolism (HCC) 06/22/2018   Microcytic anemia 06/17/2018   At risk for adverse drug reaction 06/11/2018   Critical lower limb ischemia (HCC) 05/27/2018   GI bleeding 04/02/2018   Hypokalemia 04/02/2018   Polysubstance abuse (HCC) 04/02/2018   Diabetic foot ulcer (HCC) 04/02/2018   Tinea pedis 03/07/2018   Diabetic peripheral neuropathy (HCC) 01/24/2018   Cannabis use disorder, moderate, dependence (HCC) 05/19/2017   Cocaine use disorder, moderate, dependence (HCC) 05/19/2017   CKD (chronic kidney disease) stage 3, GFR 30-59 ml/min (HCC) 02/17/2017   Noncompliance with medications 02/17/2017   Suicide attempt (HCC) 11/06/2016   Mild cognitive impairment 03/21/2016   Moderate episode of recurrent major depressive disorder (HCC) 03/21/2016   History of CVA with residual deficit 12/30/2015   Schizoaffective disorder, bipolar type (HCC) 10/07/2015   OSA (obstructive sleep apnea) 06/09/2015   Vitamin D  deficiency 01/06/2015   Bilateral knee pain 01/05/2015   Numbness and tingling in right hand 01/05/2015   Insomnia 12/07/2014   Right-sided lacunar infarction (HCC) 09/15/2014   Left hemiparesis (HCC) 09/15/2014   Dyspnea    Back pain 09/11/2013   Psychoactive substance-induced organic mood disorder (HCC) 05/28/2013   Cocaine abuse with cocaine-induced mood disorder (HCC) 05/28/2013   Cannabis dependence with cannabis-induced anxiety disorder (HCC) 05/28/2013   Morbid obesity with  BMI of 45.0-49.9, adult (HCC) 04/25/2012   Chest pain 04/17/2012   Hypertension  04/17/2012   DM (diabetes mellitus) with peripheral vascular complication (HCC) 04/17/2012   Hyperlipidemia 04/17/2012   Tobacco use 04/17/2012    Palliative Care Assessment & Plan    Recommendations/Plan: Attempted to reach further unsuccessfully  Code Status:    Code Status Orders  (From admission, onward)           Start     Ordered   03/14/24 0739  Do not attempt resuscitation (DNR)- Limited -Do Not Intubate (DNI)  Continuous       Question Answer Comment  If pulseless and not breathing No CPR or chest compressions.   In Pre-Arrest Conditions (Patient Is Breathing and Has A Pulse) Do not intubate. Provide all appropriate non-invasive medical interventions. Avoid ICU transfer unless indicated or required.   Consent: Discussion documented in EHR or advanced directives reviewed      03/14/24 0739           Code Status History     Date Active Date Inactive Code Status Order ID Comments User Context   03/05/2024 0540 03/07/2024 0223 Full Code [id number]  Cleatus Delayne GAILS, MD ED   05/31/2022 1959 06/05/2022 2105 Full Code [id number]  Sim Emery CROME, MD ED   01/17/2021 0039 01/20/2021 2313 Full Code [id number]  Seena Marsa NOVAK, MD ED   12/27/2020 2029 01/03/2021 1917 Full Code [id number]  Lonzell Emeline HERO, DO ED   12/15/2019 2254 12/16/2019 1946 Full Code [id number]  Sim Emery CROME, MD ED   12/06/2019 2254 12/10/2019 1848 Full Code [id number]  Alfornia Madison, MD ED   08/30/2018 1504 09/21/2018 1810 Full Code [id number]  Voncile Isles, MD ED   06/22/2018 1822 06/25/2018 2048 Full Code [id number]  Chrystal Collar, MD ED   05/27/2018 1516 06/10/2018 1853 Full Code [id number]  Sheree Penne Bruckner, MD Inpatient   04/02/2018 0931 04/12/2018 1917 Full Code [id number]  Barbarann Nest, MD ED   01/11/2018 1939 01/15/2018 1509 Full Code [id number]  Jacquetta Sharlot GRADE, NP Inpatient   09/08/2017 1554 09/09/2017 1845 Full Code [id number]  Pisciotta, Nat RIGGERS ED   05/18/2017 1701 05/26/2017 1057  Full Code [id number]  Rankin, Shuvon B, NP Inpatient   05/17/2017 2150 05/18/2017 1640 Full Code [id number]  Dreama Longs, MD ED   10/07/2015 2311 10/21/2015 1706 Full Code [id number]  Arno Earleen BRAVO, NP Inpatient   10/07/2015 1619 10/07/2015 2311 Full Code [id number]  Arloa Chroman, PA-C ED   09/11/2014 1516 09/26/2014 2045 Full Code [id number]  Pegge Toribio PARAS, PA-C Inpatient   09/08/2014 1357 09/11/2014 1516 Full Code [id number]  Adina Buel HERO, MD Inpatient   08/28/2014 1435 08/29/2014 2118 Full Code [id number]  Evelena Chauncey BIRCH, MD Inpatient   09/03/2013 2117 09/04/2013 1722 Full Code [id number]  Alto Lynwood SAUNDERS, MD Inpatient   05/22/2013 2346 05/26/2013 2116 Full Code [id number]  [place] Jeoffrey HERO, MD Inpatient   04/17/2012 1735 04/20/2012 2002 Full Code [id number]  Crosson, Sharlet Niemann, RN ED       Prognosis: Poor  Care plan was discussed with CCM  Thank you for allowing the Palliative Medicine Team to assist in the care of this patient.   Camelia Lewis, NP  Please contact Palliative Medicine Team phone at [phone] for questions and concerns.

## 2024-03-18 NOTE — Progress Notes (Signed)
 NAME:  Diamond Rice, MRN:  [id number], DOB:  07-01-1971, LOS: 4 /ADMISSION DATE:  03/14/2024, CONSULTATION DATE:  03/14/2024 REFERRING MD:  Dr. Cyrena, CHIEF COMPLAINT:  Chest pain   Brief Pt Description / Synopsis:  53 y.o. female admitted with Type A Aortic Dissection, deemed to be very poor surgical candidate for surgical repair by both [facility] and [facility].  Course complicated by worsening AKI, Acute Metabolic Encephalopathy, and Acute CVA's.  History of Present Illness:  Diamond Rice is a 53 y.o. female  with a past medical history of achalasia, esophageal impactions, polysubstance use, stroke with right hemiparesis (bed bound), hypertension, COPD, diabetes mellitus, and morbid obesity who presents from her skilled nursing facility to the emergency department on 03/14/2024 with complaints of acute onset nausea/vomiting/chest pain and abdominal pain.  She reported she had vomited several times with associated discomfort in her chest/abdomen/upper neck.  She denies dizziness, palpitations, shortness of breath, diarrhea, melena, hematochezia, dysuria, fever or chills.  Of note she was recently admitted at [facility] from 03/05/2024 through 03/06/2024 with Esophageal Obstruction due to food impaction and E. Coli UTI.   ED Course: Initial Vital Signs: Temperature 98.4 F orally, respiratory rate 18, pulse 70, blood pressure 157/61, SpO2 98% on room air Significant Labs: Potassium 2.9, glucose 261, BUN 9, creatinine 1.4, WBC 11.6, hemoglobin 10.1 Imaging CT Chest/Abdomen/Pelvis w contrast>>IMPRESSION: 1. DeBakey type I aortic dissection with absent contrast opacification within the false lumen distal to the posterior arch, greater than the opacified true lumen. No dissection flap identified extending into the arch vessels. 2. Nonocclusive Dissection flap extends into the proximal celiac artery 3. The left renal artery appears to arise off the false lumen with absent contrast  opacification signs of left renal infarct involving the entire upper pole and portions of the inferior pole. 4. Dilated and patulous esophagus, possibly related to chronic dysmotility. 5. Mild aneurysmal dilatation of the ascending thoracic aorta measuring 4 cm. 6. Aortic atherosclerosis and coronary artery calcifications Medications Administered: 20 mg hydralazine , IV Dilaudid ,, IV Zofran  ,esmolol  drip initiated  ED provider Dr. Cyrena discussed with Dr. Jama of vascular surgery, and recommended transfer to tertiary center as unable to opt for surgical options here at [facility] for type a dissection.  Telephone conference was held with Dr. Josepha, the patient, patient's sister, and Dr. Coletta of [facility] cardiothoracic surgery.  She was deemed to be a very poor surgical candidate with [facility]'s recommendation for medical medical management including impulse control and considering focusing just on comfort.  The patient and family agreed for admission here at [facility] for medical management understanding very high risk of mortality.  PCCM asked to admit for further workup and treatment.  Please see Significant Hospital Events section below for full detailed hospital course.   Pertinent  Medical History   Past Medical History:  Diagnosis Date   Acute ischemic stroke (HCC) 12/27/2020   Acute renal failure (ARF) (HCC) 04/02/2018   Anxiety    Chronic lower back pain 04/17/2012   just got over some; catched when I walked   COPD (chronic obstructive pulmonary disease) (HCC)    Critical lower limb ischemia (HCC) 05/27/2018   Depression    Headache(784.0) 04/17/2012   ~ qod; lately waking up in am w/one   High cholesterol    Hypertension    Migraines 04/17/2012   Obesity    Sleep apnea    CPAP   Stroke (HCC)    Type II diabetes mellitus (HCC) 08/28/2002  Micro Data:  7/18: MRSA PCR>>negative 7/21: Urine>>  Antimicrobials:   Anti-infectives (From admission, onward)    Start     Dose/Rate Route  Frequency Ordered Stop   03/17/24 1545  piperacillin -tazobactam (ZOSYN ) IVPB 3.375 g        3.375 g 12.5 mL/hr over 240 Minutes Intravenous Every 12 hours 03/17/24 1458     03/17/24 1530  cefTRIAXone  (ROCEPHIN ) 1 g in sodium chloride  0.9 % 100 mL IVPB  Status:  Discontinued        1 g 200 mL/hr over 30 Minutes Intravenous Every 24 hours 03/17/24 1430 03/17/24 1431       Significant Hospital Events: Including procedures, antibiotic start and stop dates in addition to other pertinent events   7/18: Found to have Type A Aortic Dissection, Vascular Surgery consulted, recommended transfer to tertiary care center as unable to offer surgical intervention here at [facility].  Deemed to be very poor surgical candidate by [facility].  Recommends medically treating and allow time for outcomes knowing high risk for mortality.  PCCM asked to admit, code Status changed to DNR/DNI. 03/15/24- patient with AKI today on ACE inhibitor which we will dc.  Her BP is better controlled this am.  We have palliative care following now due to non surgical cancidate with severe aortic dissection.  03/16/24- patient sleeping this am, weaning off precedex .  Remains on room air.  Renal impairment worse.  Prognosis is poor with non operable aortic dissection. On Esmolol  drip. We reached out to [facility] cardiothoracic surgery and I personally spoke with surgery attending.  After review of case it appears that due to her hemiparesis and bedbound status with comorbidities she is a non operable candidate.  [facility] cardiothroacic surgery also evaluated her case when she first arrived in ED and also stated she was not a surgical candidate. Palliative care is on case now.  03/17/24: No significant events overnight, afebrile, remains on Esmolol  and Cleviprex  infusions for BP control.  Precedex  is off, Remains Encephalopathic, will obtain MRI Brain.  NG tube placed to be able to start PO antihypertensives in attempt to wean IV infusions.   With worsening AKI and hyperkalemia, shifting measures given.  With worsening Leukocytosis, will obtain UA while placing foley. 03/18/24: MRI overnight concerning for acute multifocal ischemia within both hemispheres. PO Metoprolol  and Hydralazine  does increased, weaned off Esmolol  and Cleviprex  infusions.  Creatinine continues to worsen with persistent mild hyperkalemia, UOP 525 cc last 24 hrs (net +10L).  Continuing to work on goals of care.  Interim History / Subjective:  As outlined above under Significant Hospital Events section  Objective   Blood pressure 100/63, pulse 72, temperature 97.9 F (36.6 C), temperature source Axillary, resp. rate 15, height 5' 1 (1.549 m), weight 126 kg, last menstrual period 04/26/2018, SpO2 94%.        Intake/Output Summary (Last 24 hours) at 03/18/2024 0726 Last data filed at 03/18/2024 0600 Gross per 24 hour  Intake 1421.63 ml  Output 525 ml  Net 896.63 ml   Filed Weights   03/16/24 0247 03/17/24 0500 03/18/24 0357  Weight: 117.9 kg 125.4 kg 126 kg    Examination: General: Acute on chronically ill-appearing obese female, laying in bed, in no acute distress HENT: Atraumatic, normal cephalic, neck supple, no JVD, NG tube in place Lungs: Clear breath sounds, even, nonlabored Cardiovascular: Regular rate and rhythm, S1-S2, murmurs, rubs, gallops Abdomen: Obese, soft, tender to palpation, no guarding or rebound tenderness, bowel sounds positive x 4  Extremities: Generalized weakness (right-sided hemiparesis at baseline from previous stroke), no cyanosis, warm extremities Neuro: Lethargic, arouses to voice/gentle stimulation, will track, moves RUE purposefully but otherwise unable to follow commands, pupils PERRLA GU: Deferred   Resolved Hospital Problem list     Assessment & Plan:   #Type A Aortic Dissection PMHx: HTN, HLD, stroke with right hemiparesis -Continuous cardiac monitoring -Maintain HR <60 & SBP 100-120 -Esmolol  and Nicardipine   drips as needed ~ weaned off -Prn Hydralazine  IV -Continue PO Metoprolol , Hydralazine  per NG tube, continue clonidine  patch -Pain control -Vascular Surgery consulted, appreciate input ~ no surgical intervention available here at [facility] -Case has been discussed with both [facility] & [facility]~ deemed to be VERY POOR SURGICAL CANDIDATE and refused transfer ~ recommended medical management  #COPD without acute exacerbation PMHx: OSA, morbid obesity -Supplemental O2 as needed to maintain O2 sats 88 to 92% -Follow intermittent Chest X-ray & ABG as needed -Bronchodilators prn -Pulmonary toilet as able  #Acute Kidney Injury #Hyperkalemia -Monitor I&O's / urinary output -Follow BMP -Ensure adequate renal perfusion -Avoid nephrotoxic agents as able -Replace electrolytes as indicated ~ Pharmacy following for assistance with electrolyte replacement -Will give shifting measures: insulin , bicarb, Lokelma   #Anemia -Monitor for S/Sx of bleeding -Trend CBC -SCD's for VTE Prophylaxis  -Transfuse for Hgb <7  #UTI PMHx: recent E. Coli UTI (ESBL) 03/05/24 -Monitor fever curve -Trend WBC's & Procalcitonin -Follow cultures as above -Continue empiric Zosyn  pending cultures & sensitivities  #Diabetes Mellitus Type II -CBG's q4h; Target range of 140 to 180 -SSI -Follow ICU Hypo/Hyperglycemia protocol  #Acute Metabolic Encephalopathy #Acute CVA's ~ suspect etiology is from Aortic Dissection  PMHx: CVA with right sided hemiparesis -MRI Brain 7/21 with multifocal acute ischemia within both hemispheres affecting the MCA and PCA territories, no midline shift or mass effect noted.  Multifocal central chronic microhemorrhage most consistent with chronic hypertensive angiopathy . Multifocal hyperintense T2-weighted signal within the cerebral white matter, most commonly due to chronic small vessel disease. -Treatment of metabolic derangements and pain as outlined above -Provide supportive  care -Promote normal sleep/wake cycle and family presence -Avoid sedating medications as able -Dicussed with Dr. Isadora ~ will hold off on consulting Neurology currently as would likely not change management nor outcome given poor prognosis due to  Aortic Dissection not amendable to surgery and with worsening AKI     Pt is critically ill with multiorgan failure. Prognosis is guarded, high risk for further decompensation, cardiac arrest, and death.  Given current critical illness with inoperable Type A Aortic Dissection, superimposed on multiple chronic co-morbidities and advanced age, overall long term prognosis is poor.  Pt is DNR/DNI status, recommend transition to comfort measures/transfer back to SNF with Hospice. Palliative Care following to assist with GOC discussions.   Best Practice (right click and Reselect all SmartList Selections daily)   Diet/type: NPO until mental status improved, continue tube feeds DVT prophylaxis: SCD GI prophylaxis: N/A Lines: Central line and yes and it is still needed Foley:  will place due to urinary retention Code Status:  DNR Last date of multidisciplinary goals of care discussion [03/18/24]  7/22: Will attempt to update pts' family via telephone on plan of care.  Labs   CBC: Recent Labs  Lab 03/14/24 0418 03/15/24 0626 03/16/24 0804 03/17/24 0422 03/18/24 0504  WBC 11.6* 10.7* 17.9* 20.7* 24.8*  NEUTROABS 8.4*  --   --   --   --   HGB 10.1* 9.6* 9.3* 8.3* 9.5*  HCT 32.5* 30.5*  29.7* 26.3* 30.0*  MCV 76.5* 76.8* 75.8* 76.2* 75.0*  PLT 276 225 249 204 255    Basic Metabolic Panel: Recent Labs  Lab 03/14/24 0418 03/15/24 0626 03/16/24 0804 03/16/24 1428 03/16/24 2124 03/17/24 0422 03/17/24 1234 03/18/24 0504  NA 141 140 135 138 139 136 138 139  K 2.9* 4.4 5.7* 5.5* 5.7* 5.7* 5.5* 5.6*  CL 103 112* 109 111 110 109 109 105  CO2 28 23 16* 18* 19* 19* 21* 19*  GLUCOSE 261* 121* 125* 153* 124* 103* 136* 130*  BUN 9 15 22* 25* 25*  27* 29* 32*  CREATININE 1.41* 2.22* 3.16* 3.57* 3.77* 3.99* 4.18* 4.48*  CALCIUM  9.3 8.6* 8.9 8.6* 8.5* 8.5* 8.5* 8.7*  MG 2.0 1.9  --   --   --   --   --  2.1  PHOS 2.8 3.4 4.8*  --   --  5.2*  --  5.9*   GFR: Estimated Creatinine Clearance: 18.6 mL/min (A) (by C-G formula based on SCr of 4.48 mg/dL (H)). Recent Labs  Lab 03/15/24 0626 03/16/24 0804 03/17/24 0422 03/18/24 0504  WBC 10.7* 17.9* 20.7* 24.8*    Liver Function Tests: Recent Labs  Lab 03/14/24 0418 03/15/24 0626 03/16/24 0804 03/17/24 0422 03/18/24 0504  AST 16  --   --   --   --   ALT 14  --   --   --   --   ALKPHOS 95  --   --   --   --   BILITOT 0.5  --   --   --   --   PROT 7.5  --   --   --   --   ALBUMIN 3.0* 2.5* 2.2* 2.0* 2.0*   Recent Labs  Lab 03/14/24 0418  LIPASE 30   No results for input(s): AMMONIA in the last 168 hours.  ABG    Component Value Date/Time   PHART 7.487 (H) 12/06/2019 1802   PCO2ART 32.6 12/06/2019 1802   PO2ART 69.0 (L) 12/06/2019 1802   HCO3 24.7 12/06/2019 1802   TCO2 28 12/27/2020 1755   O2SAT 95.0 12/06/2019 1802     Coagulation Profile: No results for input(s): INR, PROTIME in the last 168 hours.  Cardiac Enzymes: No results for input(s): CKTOTAL, CKMB, CKMBINDEX, TROPONINI in the last 168 hours.  HbA1C: Hgb A1c MFr Bld  Date/Time Value Ref Range Status  03/04/2024 10:37 PM 8.6 (H) 4.8 - 5.6 % Final    Comment:    (NOTE)         Prediabetes: 5.7 - 6.4         Diabetes: >6.4         Glycemic control for adults with diabetes: <7.0   06/01/2022 05:05 AM 7.1 (H) 4.8 - 5.6 % Final    Comment:    (NOTE) Pre diabetes:          5.7%-6.4%  Diabetes:              >6.4%  Glycemic control for   <7.0% adults with diabetes     CBG: Recent Labs  Lab 03/17/24 1523 03/17/24 1920 03/17/24 2028 03/17/24 2317 03/18/24 0317  GLUCAP 128* 142* 211* 141* 124*    Review of Systems:   Unable to assess due to AMS   Past Medical History:   She,  has a past medical history of Acute ischemic stroke (HCC) (12/27/2020), Acute renal failure (ARF) (HCC) (04/02/2018), Anxiety, Chronic lower back pain (04/17/2012), COPD (  chronic obstructive pulmonary disease) (HCC), Critical lower limb ischemia (HCC) (05/27/2018), Depression, Headache(784.0) (04/17/2012), High cholesterol, Hypertension, Migraines (04/17/2012), Obesity, Sleep apnea, Stroke (HCC), and Type II diabetes mellitus (HCC) (08/28/2002).   Surgical History:   Past Surgical History:  Procedure Laterality Date   ABDOMINAL AORTOGRAM W/LOWER EXTREMITY Left 05/27/2018   Procedure: ABDOMINAL AORTOGRAM W/LOWER EXTREMITY Runoff and Possible Intervention;  Surgeon: Sheree Penne Bruckner, MD;  Location: [facility] INVASIVE CV LAB;  Service: Cardiovascular;  Laterality: Left;   APPLICATION OF WOUND VAC Left 06/06/2018   Procedure: APPLICATION OF WOUND VAC;  Surgeon: Sheree Penne Bruckner, MD;  Location[facility] OR;  Service: Vascular;  Laterality: Left;   BALLOON DILATION N/A 12/31/2020   Procedure: MERRILL HODGKIN;  Surgeon: Saintclair Jasper, MD;  Location: [facility] ENDOSCOPY;  Service: Gastroenterology;  Laterality: N/A;   BIOPSY  04/04/2018   Procedure: BIOPSY;  Surgeon: Charlanne Groom, MD;  Location: [facility] ENDOSCOPY;  Service: Endoscopy;;   BIOPSY  10/27/2020   Procedure: BIOPSY;  Surgeon: Burnette Fallow, MD;  Location: [facility] ENDOSCOPY;  Service: Endoscopy;;   BOTOX  INJECTION N/A 12/31/2020   Procedure: BOTOX  INJECTION;  Surgeon: Saintclair Jasper, MD;  Location: [facility] ENDOSCOPY;  Service: Gastroenterology;  Laterality: N/A;   CARDIAC CATHETERIZATION  ~ 2011   COLONOSCOPY N/A 04/04/2018   Procedure: COLONOSCOPY;  Surgeon: Charlanne Groom, MD;  Location: [facility] ENDOSCOPY;  Service: Endoscopy;  Laterality: N/A;   COLONOSCOPY WITH PROPOFOL  N/A 10/27/2020   Procedure: COLONOSCOPY WITH PROPOFOL ;  Surgeon: Burnette Fallow, MD;  Location: [facility] ENDOSCOPY;  Service: Endoscopy;  Laterality: N/A;   ESOPHAGOGASTRODUODENOSCOPY N/A 12/31/2020   Procedure:  ESOPHAGOGASTRODUODENOSCOPY (EGD);  Surgeon: Saintclair Jasper, MD;  Location: [facility] ENDOSCOPY;  Service: Gastroenterology;  Laterality: N/A;   ESOPHAGOGASTRODUODENOSCOPY N/A 03/05/2024   Procedure: EGD (ESOPHAGOGASTRODUODENOSCOPY);  Surgeon: Jinny Carmine, MD;  Location: [facility] ENDOSCOPY;  Service: Endoscopy;  Laterality: N/A;   ESOPHAGOGASTRODUODENOSCOPY (EGD) WITH PROPOFOL  N/A 10/27/2020   Procedure: ESOPHAGOGASTRODUODENOSCOPY (EGD) WITH PROPOFOL   WITH POSSIBLE DIL;  Surgeon: Burnette Fallow, MD;  Location: [facility] ENDOSCOPY;  Service: Endoscopy;  Laterality: N/A;   ESOPHAGOGASTRODUODENOSCOPY (EGD) WITH PROPOFOL  N/A 10/19/2021   Procedure: ESOPHAGOGASTRODUODENOSCOPY (EGD) WITH PROPOFOL ;  Surgeon: Unk Corinn Skiff, MD;  Location: [facility] ENDOSCOPY;  Service: Gastroenterology;  Laterality: N/A;   ESOPHAGOGASTRODUODENOSCOPY (EGD) WITH PROPOFOL  N/A 06/02/2022   Procedure: ESOPHAGOGASTRODUODENOSCOPY (EGD) WITH PROPOFOL ;  Surgeon: Therisa Bi, MD;  Location: [facility] ENDOSCOPY;  Service: Gastroenterology;  Laterality: N/A;   FOREIGN BODY REMOVAL  12/31/2020   Procedure: FOREIGN BODY REMOVAL;  Surgeon: Saintclair Jasper, MD;  Location: [facility] ENDOSCOPY;  Service: Gastroenterology;;   LOWER EXTREMITY ANGIOGRAM Left 05/27/2018   PERIPHERAL VASCULAR INTERVENTION Left 05/27/2018   Procedure: PERIPHERAL VASCULAR INTERVENTION;  Surgeon: Sheree Penne Bruckner, MD;  Location: [facility] INVASIVE CV LAB;  Service: Cardiovascular;  Laterality: Left;   POLYPECTOMY  04/04/2018   Procedure: POLYPECTOMY;  Surgeon: Charlanne Groom, MD;  Location: [facility] ENDOSCOPY;  Service: Endoscopy;;   POLYPECTOMY  10/27/2020   Procedure: POLYPECTOMY;  Surgeon: Burnette Fallow, MD;  Location: [facility] ENDOSCOPY;  Service: Endoscopy;;   TRANSMETATARSAL AMPUTATION Left 05/28/2018   Procedure: AMPUTATION TOES THREE, FOUR AND FIVE on left foot;  Surgeon: Sheree Penne Bruckner, MD;  Location: [facility] OR;  Service: Vascular;  Laterality: Left;   WOUND DEBRIDEMENT Left 06/06/2018   Procedure:  DEBRIDEMENT WOUND LEFT FOOT;  Surgeon: Sheree Penne Bruckner, MD;  Location[facility] OR;  Service: Vascular;  Laterality: Left;     Social History:   reports that she has been smoking cigarettes. She has a 7 pack-year  smoking history. She has never used smokeless tobacco. She reports current drug use. Drugs: Marijuana and Crack cocaine. She reports that she does not drink alcohol .   Family History:  Her family history includes Heart attack (age of onset: 4) in her mother; Stroke (age of onset: 34) in her brother and brother; Stroke (age of onset: 69) in her father.   Allergies Allergies  Allergen Reactions   Morphine  And Codeine Hives   Metformin  Diarrhea and Other (See Comments)    Allergic, per [facility]     Home Medications  Prior to Admission medications   Medication Sig Start Date End Date Taking? Authorizing Provider  acetaminophen  (TYLENOL ) 500 MG tablet Take 500 mg by mouth 3 (three) times daily.    [provider]  aspirin  EC 81 MG EC tablet Take 1 tablet (81 mg total) by mouth daily. 09/21/18   Noemi Reena CROME, NP  atorvastatin  (LIPITOR ) 40 MG tablet Take 2 tablets (80 mg total) by mouth daily at 6 PM. 01/03/21   Hongalgi, Anand D, MD  baclofen  (LIORESAL ) 10 MG tablet Take 5 mg by mouth 3 (three) times daily.    [provider]  bisacodyl  (DULCOLAX) 10 MG suppository Place 10 mg rectally as needed for moderate constipation.    [provider]  carvedilol  (COREG ) 25 MG tablet Take 25 mg by mouth 2 (two) times daily with a meal.    [provider]  cloNIDine  (CATAPRES  - DOSED IN MG/24 HR) 0.3 mg/24hr patch 0.3 mg once a week. 11/08/21   [provider]  famotidine (PEPCID) 40 MG tablet Take 40 mg by mouth daily. 12/15/21   [provider]  feeding supplement (ENSURE ENLIVE / ENSURE PLUS) LIQD Take 237 mLs by mouth 2 (two) times daily between meals. Patient not taking: Reported on 09/20/2023 06/05/22   Caleen Qualia, MD  ferrous sulfate   220 (44 Fe) MG/5ML solution Take 7 mLs by mouth every Monday, Wednesday, and Friday.    [provider]  FLUoxetine  (PROZAC ) 40 MG capsule Take 80 mg by mouth daily. 12/26/21   [provider]  GVOKE HYPOPEN  1-PACK 1 MG/0.2ML SOAJ Inject 1 mg into the skin as needed. 03/03/24   [provider]  HUMULIN R  100 UNIT/ML injection Inject 0-9 Units into the skin 3 (three) times daily before meals. 12/25/23   [provider]  LANTUS  SOLOSTAR 100 UNIT/ML Solostar Pen Inject 15 Units into the skin at bedtime. 03/07/23   [provider]  lidocaine  (LMX) 4 % cream Apply 1 Application topically daily.    [provider]  magnesium  hydroxide (MILK OF MAGNESIA) 400 MG/5ML suspension Take by mouth daily as needed for mild constipation.    [provider]  melatonin 5 MG TABS Take 5 mg by mouth at bedtime.    [provider]  nitroGLYCERIN  (NITROSTAT ) 0.4 MG SL tablet Place 0.4 mg under the tongue every 5 (five) minutes as needed for chest pain.    [provider]  oxyCODONE  (ROXICODONE ) 5 MG/5ML solution Take 5 mLs by mouth every 8 (eight) hours as needed for moderate pain (pain score 4-6) or severe pain (pain score 7-10).    [provider]  pantoprazole  sodium (PROTONIX ) 40 mg Take 40 mg by mouth daily.    [provider]  polyethylene glycol (MIRALAX  / GLYCOLAX ) 17 g packet Take 17 g by mouth every Monday, Wednesday, and Friday.    [provider]  Potassium Chloride  10 % SOLN Take 15  mLs by mouth daily.    [provider]  promethazine  (PHENERGAN ) 25 MG/ML injection Inject 25 mg into the muscle as needed for nausea or vomiting. 05/25/22   [provider]  Semaglutide, 1 MG/DOSE, 2 MG/1.5ML SOPN Inject 2 mg into the skin every Wednesday. 03/12/23   [provider]  senna (SENOKOT) 8.6 MG tablet Take 1 tablet by mouth daily.    [provider]  traZODone  (DESYREL ) 50 MG tablet  Take 25 mg by mouth at bedtime.    [provider]     Critical care time: 40 minutes     [name] Lecher, AGACNP-BC [facility] Prefer epic messenger for cross cover needs If after hours, please call E-link

## 2024-03-19 ENCOUNTER — Inpatient Hospital Stay

## 2024-03-19 DIAGNOSIS — J9602 Acute respiratory failure with hypercapnia: Secondary | ICD-10-CM

## 2024-03-19 DIAGNOSIS — J9601 Acute respiratory failure with hypoxia: Secondary | ICD-10-CM | POA: Diagnosis not present

## 2024-03-19 DIAGNOSIS — I7100 Dissection of unspecified site of aorta: Secondary | ICD-10-CM | POA: Diagnosis not present

## 2024-03-19 DIAGNOSIS — I5033 Acute on chronic diastolic (congestive) heart failure: Secondary | ICD-10-CM

## 2024-03-19 DIAGNOSIS — Z7189 Other specified counseling: Secondary | ICD-10-CM | POA: Diagnosis not present

## 2024-03-19 LAB — RENAL FUNCTION PANEL
Albumin: 2 g/dL — ABNORMAL LOW (ref 3.5–5.0)
Anion gap: 11 (ref 5–15)
BUN: 45 mg/dL — ABNORMAL HIGH (ref 6–20)
CO2: 20 mmol/L — ABNORMAL LOW (ref 22–32)
Calcium: 9 mg/dL (ref 8.9–10.3)
Chloride: 106 mmol/L (ref 98–111)
Creatinine, Ser: 4.98 mg/dL — ABNORMAL HIGH (ref 0.44–1.00)
GFR, Estimated: 10 mL/min — ABNORMAL LOW (ref >60)
Glucose, Bld: 153 mg/dL — ABNORMAL HIGH (ref 70–99)
Phosphorus: 6 mg/dL — ABNORMAL HIGH (ref 2.5–4.6)
Potassium: 5.1 mmol/L (ref 3.5–5.1)
Sodium: 137 mmol/L (ref 135–145)

## 2024-03-19 LAB — COMPREHENSIVE METABOLIC PANEL WITH GFR
ALT: 27 U/L (ref 0–44)
AST: 24 U/L (ref 15–41)
Albumin: 2.1 g/dL — ABNORMAL LOW (ref 3.5–5.0)
Alkaline Phosphatase: 71 U/L (ref 38–126)
Anion gap: 12 (ref 5–15)
BUN: 60 mg/dL — ABNORMAL HIGH (ref 6–20)
CO2: 19 mmol/L — ABNORMAL LOW (ref 22–32)
Calcium: 9.2 mg/dL (ref 8.9–10.3)
Chloride: 106 mmol/L (ref 98–111)
Creatinine, Ser: 5.34 mg/dL — ABNORMAL HIGH (ref 0.44–1.00)
GFR, Estimated: 9 mL/min — ABNORMAL LOW (ref >60)
Glucose, Bld: 252 mg/dL — ABNORMAL HIGH (ref 70–99)
Potassium: 5.3 mmol/L — ABNORMAL HIGH (ref 3.5–5.1)
Sodium: 137 mmol/L (ref 135–145)
Total Bilirubin: 0.5 mg/dL (ref 0.0–1.2)
Total Protein: 6.5 g/dL (ref 6.5–8.1)

## 2024-03-19 LAB — CBC
HCT: 27.3 % — ABNORMAL LOW (ref 36.0–46.0)
HCT: 27.4 % — ABNORMAL LOW (ref 36.0–46.0)
Hemoglobin: 8.9 g/dL — ABNORMAL LOW (ref 12.0–15.0)
Hemoglobin: 8.7 g/dL — ABNORMAL LOW (ref 12.0–15.0)
MCH: 23.7 pg — ABNORMAL LOW (ref 26.0–34.0)
MCH: 23.3 pg — ABNORMAL LOW (ref 26.0–34.0)
MCHC: 32.6 g/dL (ref 30.0–36.0)
MCHC: 31.8 g/dL (ref 30.0–36.0)
MCV: 72.8 fL — ABNORMAL LOW (ref 80.0–100.0)
MCV: 73.3 fL — ABNORMAL LOW (ref 80.0–100.0)
Platelets: 280 K/uL (ref 150–400)
Platelets: 299 K/uL (ref 150–400)
RBC: 3.75 MIL/uL — ABNORMAL LOW (ref 3.87–5.11)
RBC: 3.74 MIL/uL — ABNORMAL LOW (ref 3.87–5.11)
RDW: 17.6 % — ABNORMAL HIGH (ref 11.5–15.5)
RDW: 17.9 % — ABNORMAL HIGH (ref 11.5–15.5)
WBC: 23.4 K/uL — ABNORMAL HIGH (ref 4.0–10.5)
WBC: 21.9 K/uL — ABNORMAL HIGH (ref 4.0–10.5)
nRBC: 0.4 % — ABNORMAL HIGH (ref 0.0–0.2)
nRBC: 1 % — ABNORMAL HIGH (ref 0.0–0.2)

## 2024-03-19 LAB — GLUCOSE, CAPILLARY
Glucose-Capillary: 136 mg/dL — ABNORMAL HIGH (ref 70–99)
Glucose-Capillary: 142 mg/dL — ABNORMAL HIGH (ref 70–99)
Glucose-Capillary: 162 mg/dL — ABNORMAL HIGH (ref 70–99)
Glucose-Capillary: 168 mg/dL — ABNORMAL HIGH (ref 70–99)
Glucose-Capillary: 246 mg/dL — ABNORMAL HIGH (ref 70–99)

## 2024-03-19 LAB — BLOOD GAS, VENOUS

## 2024-03-19 LAB — BRAIN NATRIURETIC PEPTIDE: B Natriuretic Peptide: 254.7 pg/mL — ABNORMAL HIGH (ref 0.0–100.0)

## 2024-03-19 LAB — D-DIMER, QUANTITATIVE: D-Dimer, Quant: 1.79 ug{FEU}/mL — ABNORMAL HIGH (ref 0.00–0.50)

## 2024-03-19 MED ORDER — INSULIN ASPART 100 UNIT/ML IJ SOLN
0.0000 [IU] | Freq: Four times a day (QID) | INTRAMUSCULAR | Status: DC
  Administered 2024-03-19 – 2024-03-20 (×3): 3 [IU] via SUBCUTANEOUS
  Filled 2024-03-19 (×3): qty 1

## 2024-03-19 NOTE — Hospital Course (Addendum)
 Hospital course / significant events: 53 year old female who unfortunately presented with aortic dissection, deemed not a surgical candidate by multiple surgical consultants, including calls fielded to [facility] and [facility] who recommended medical management, and while in our ICU she developed toxic metabolic encephalopathy and was incoherent. MRI overnight showed multiple strokes that are likely embolic secondary to the dissection flap. She has on clevidipine  gtt with control of her BP, and subsequent transition to orals.   07/18: Found to have Type A Aortic Dissection, Vascular Surgery consulted, recommended transfer to tertiary care however declined by [facility] - recs  medically treating.  PCCM asked to admit, code Status changed to DNR/DNI. 07/19- AKI on ACE inhibitor which we will dc.  BP was better controlled this am. Palliative care following now due to non surgical cancidate with severe aortic dissection.  07/20- weaning off precedex .  Remains on room air.  Renal impairment worse.  Esmolol  drip. ICU team reached out to [facility] cardiothoracic surgery - due to her hemiparesis and bedbound status with comorbidities she is a non operable candidate.  07/21: remains on Esmolol  and Cleviprex  infusions for BP control.  Precedex  is off. Remains Encephalopathic, will obtain MRI Brain.  NG tube placed to be able to start PO antihypertensives in attempt to wean IV infusions.  With worsening AKI and hyperkalemia, shifting measures given.  With worsening Leukocytosis, obtain UA while placing foley. 07/22: MRI overnight concerning for acute multifocal ischemia within both hemispheres. PO Metoprolol  and Hydralazine  does increased, weaned off Esmolol  and Cleviprex  infusions.  Creatinine continues to worsen with persistent mild hyperkalemia, UOP 525 cc last 24 hrs (net +10L). 07/23: hospitalist service assumes care from ICU team. Palliative care team spoke w/ brother - continue treatment for now, hospice to eval, low threshold for  comfort measures if she decompensates.  07/24: resp distress overnight, on BiPAP - Pt not tolerating BiPAP and in distress/agitated. Called brother in patient's room, paliative care present and pt's friend present - agreement w/ brother for comfort measures, DC aggressive interventions.     Consultants:  Vascular Surgery  Palliative care   Procedures/Surgeries: none      ASSESSMENT & PLAN:   COMFORT MEASURES STATUS STOP non-comfort interventions Fentanyl  gtt and once comfortable can remove BiPAP    Type A Aortic Dissection PMHx: HTN, HLD, stroke with right hemiparesis Vascular Surgery consulted, appreciate input ~ no surgical intervention available here at [facility]. Case was also discussed with both [facility] & [facility] ~ deemed to be VERY POOR SURGICAL CANDIDATE and refused transfer ~ recommended medical management Was in ICU on Esmolol  and Nicardipine  drips ~ weaned off D/c treatments on comfort measures   COPD without acute exacerbation OSA Acute Kidney Injury Hyperkalemia - improved/resolved Anemia  UTI recent E. Coli UTI (ESBL) 03/05/24 Diabetes Mellitus Type II Acute Metabolic Encephalopathy Acute CVA's ~ suspect etiology is from Aortic Dissection  PMHx: CVA with right sided hemiparesis MRI Brain 7/21 with multifocal acute ischemia within both hemispheres affecting the MCA and PCA territories, no midline shift or mass effect noted.  Multifocal central chronic microhemorrhage most consistent with chronic hypertensive angiopathy . Multifocal hyperintense T2-weighted signal within the cerebral white matter, most commonly due to chronic small vessel disease. D/c treatments on comfort measures    Class 3 obesity based on BMI: Body mass index is 44.49 kg/m.SABRA Significantly low or high BMI is associated with higher medical risk.  Underweight - under 18  overweight - 25 to 29 obese - 30 or more  Class 1 obesity: BMI of 30.0 to 34 Class 2 obesity: BMI of 35.0 to  39 Class 3 obesity: BMI of 40.0 to 49 Super Morbid Obesity: BMI 50-59 Super-super Morbid Obesity: BMI 60+ Healthy nutrition and physical activity advised as adjunct to other disease management and risk reduction treatments    DVT prophylaxis: no anticoagulation on comfort measures IV fluids: no continuous IV fluids on comfort measures Nutrition: as desired but pt encephalopathic, would leave NPO  Central lines / other devices: NG can remove, Foley ca nleave in place  Code Status: DNR/DNI ACP documentation reviewed:  none on file in [facility]  TOC needs: TBD, expect hospital death

## 2024-03-19 NOTE — Progress Notes (Signed)
 Per Dr.Mansy, order to hold tubes feeds. Possible aspiration. Chest Xray ordered.  Diamond Rice PEAK 03/19/2024

## 2024-03-19 NOTE — Progress Notes (Signed)
 Daily Progress Note   [name]       Date: 03/19/2024 DOB: [birth year]  Age: 53 y.o. MRN#: [id number] Attending Physician: Marsa Edelman, DO Primary Care Physician: [place] Admit Date: 03/14/2024  Reason for Consultation/Follow-up: Establishing goals of care  Subjective: Notes and labs reviewed.  Patient has been moved from ICU/stepdown to MedSurg unit.  She is currently lying in bed at this time and appears uncomfortable.  She attempts to communicate through nodding and shaking her head today but moves her head so  little that I am unable to understand her responses.  No family at bedside.  Called the patient's brother Lytle.  [name] states that he is the Runner, broadcasting/film/video for his sister.  We discussed her status and diagnoses.  Discussed a continued aggressive path versus a comfort path.  He states he would like to focus on maintaining comfort and dignity for what time the patient has left to live and would like patient return to her facility with hospice to follow their.   Attending and TOC updated.   Length of Stay: 5  Current Medications: Scheduled Meds:   amLODipine   10 mg Per Tube Daily   Chlorhexidine  Gluconate Cloth  6 each Topical Daily   cloNIDine   0.3 mg Transdermal Weekly   docusate  50 mg Per Tube BID   FLUoxetine   80 mg Per Tube Daily   free water   30 mL Per Tube Q4H   hydrALAZINE   75 mg Per Tube Q6H   insulin  aspart  0-9 Units Subcutaneous Q4H   metoprolol  tartrate  50 mg Per Tube Q8H   nutrition supplement (JUVEN)  1 packet Per Tube BID BM   mouth rinse  15 mL Mouth Rinse 4 times per day   polyethylene glycol  17 g Per Tube Daily   thiamine   100 mg Per Tube Daily    Continuous Infusions:  feeding supplement (NEPRO  CARB STEADY) 1,000 mL (03/19/24 0040)   piperacillin -tazobactam (ZOSYN )  IV 3.375 g (03/19/24 0918)    PRN Meds: diazepam , docusate sodium , hydrALAZINE , HYDROcodone -acetaminophen , HYDROmorphone  (DILAUDID ) injection, mouth rinse, polyethylene glycol  Physical Exam Pulmonary:     Effort: Pulmonary effort is normal.  Skin:    General: Skin is warm and dry.  Neurological:     Mental Status:  She is alert.             Vital Signs: BP 108/60 (BP Location: Left Arm)   Pulse 96   Temp 98.9 F (37.2 C)   Resp 18   Ht 5' 1 (1.549 m)   Wt 106.8 kg   LMP 04/26/2018 (Approximate)   SpO2 96%   BMI 44.49 kg/m  SpO2: SpO2: 96 % O2 Device: O2 Device: Room Air O2 Flow Rate: O2 Flow Rate (L/min): 3 L/min  Intake/output summary:  Intake/Output Summary (Last 24 hours) at 03/19/2024 1151 Last data filed at 03/19/2024 1150 Gross per 24 hour  Intake 313.19 ml  Output 80 ml  Net 233.19 ml   LBM: Last BM Date :  (pta) Baseline Weight: Weight: 118.1 kg Most recent weight: Weight: 106.8 kg         Patient Active Problem List   Diagnosis Date Noted   Type A aortic dissection (HCC) 03/14/2024   Hypophosphatemia 03/06/2024   Atherosclerosis of artery of extremity with intermittent claudication (HCC) 05/17/2023   Achalasia of esophagus 05/31/2022   Pain of lower extremity 12/19/2021   Achalasia 08/17/2021   Dysphagia 08/17/2021   Acute CVA (cerebrovascular accident) (HCC) 01/18/2021   Dysarthria    Urinary tract infection    Abnormal finding on lung imaging 12/27/2020   Severe episode of recurrent major depressive disorder, with psychotic features (HCC)    Cerebrovascular accident (CVA) (HCC) 12/06/2019   AKI (acute kidney injury) (HCC) 12/06/2019   LVH (left ventricular hypertrophy) 08/10/2019   Edema of lower extremity 11/26/2018   Loss of appetite 11/26/2018   Urinary incontinence 11/26/2018   Allergic rhinitis 11/14/2018   Deep venous thrombosis (HCC) 11/14/2018   Nicotine   dependence 11/14/2018   Hemorrhagic stroke (HCC)    Diastolic dysfunction    Type 2 diabetes mellitus (HCC)    Anemia of chronic disease    Chronic obstructive pulmonary disease (HCC)    ICH (intracerebral hemorrhage) (HCC) 08/30/2018   Hypertensive heart disease without heart failure 07/04/2018   Nonrheumatic aortic valve insufficiency 07/04/2018   Esophageal obstruction due to food impaction, recurrent    Class 2 severe obesity due to excess calories with serious comorbidity and body mass index (BMI) of 35.0 to 35.9 in adult [facility])    Peripheral vascular disease (HCC)    History of osteomyelitis    History of amputation of lesser toe (HCC)    Pulmonary embolism (HCC) 06/22/2018   Microcytic anemia 06/17/2018   At risk for adverse drug reaction 06/11/2018   Critical lower limb ischemia (HCC) 05/27/2018   GI bleeding 04/02/2018   Hypokalemia 04/02/2018   Polysubstance abuse (HCC) 04/02/2018   Diabetic foot ulcer (HCC) 04/02/2018   Tinea pedis 03/07/2018   Diabetic peripheral neuropathy (HCC) 01/24/2018   Cannabis use disorder, moderate, dependence (HCC) 05/19/2017   Cocaine use disorder, moderate, dependence (HCC) 05/19/2017   CKD (chronic kidney disease) stage 3, GFR 30-59 ml/min (HCC) 02/17/2017   Noncompliance with medications 02/17/2017   Suicide attempt (HCC) 11/06/2016   Mild cognitive impairment 03/21/2016   Moderate episode of recurrent major depressive disorder (HCC) 03/21/2016   History of CVA with residual deficit 12/30/2015   Schizoaffective disorder, bipolar type (HCC) 10/07/2015   OSA (obstructive sleep apnea) 06/09/2015   Vitamin D  deficiency 01/06/2015   Bilateral knee pain 01/05/2015   Numbness and tingling in right hand 01/05/2015   Insomnia 12/07/2014   Right-sided lacunar infarction (HCC) 09/15/2014   Left hemiparesis (HCC) 09/15/2014  Dyspnea    Back pain 09/11/2013   Psychoactive substance-induced organic mood disorder (HCC) 05/28/2013   Cocaine abuse  with cocaine-induced mood disorder (HCC) 05/28/2013   Cannabis dependence with cannabis-induced anxiety disorder (HCC) 05/28/2013   Morbid obesity with BMI of 45.0-49.9, adult (HCC) 04/25/2012   Chest pain 04/17/2012   Hypertension 04/17/2012   DM (diabetes mellitus) with peripheral vascular complication (HCC) 04/17/2012   Hyperlipidemia 04/17/2012   Tobacco use 04/17/2012    Palliative Care Assessment & Plan     Recommendations/Plan: Spoke with brother.  He states he would like for his sister to return to her facility with hospice to follow there.  Code Status:    Code Status Orders  (From admission, onward)           Start     Ordered   03/14/24 0739  Do not attempt resuscitation (DNR)- Limited -Do Not Intubate (DNI)  Continuous       Question Answer Comment  If pulseless and not breathing No CPR or chest compressions.   In Pre-Arrest Conditions (Patient Is Breathing and Has A Pulse) Do not intubate. Provide all appropriate non-invasive medical interventions. Avoid ICU transfer unless indicated or required.   Consent: Discussion documented in EHR or advanced directives reviewed      03/14/24 0739           Code Status History     Date Active Date Inactive Code Status Order ID Comments User Context   03/05/2024 0540 03/07/2024 0223 Full Code [id number]  Cleatus Delayne GAILS, MD ED   05/31/2022 1959 06/05/2022 2105 Full Code [id number]  Sim Emery CROME, MD ED   01/17/2021 0039 01/20/2021 2313 Full Code [id number]  Seena Marsa NOVAK, MD ED   12/27/2020 2029 01/03/2021 1917 Full Code [id number]  Lonzell Emeline HERO, DO ED   12/15/2019 2254 12/16/2019 1946 Full Code [id number]  Sim Emery CROME, MD ED   12/06/2019 2254 12/10/2019 1848 Full Code [id number]  Alfornia Madison, MD ED   08/30/2018 1504 09/21/2018 1810 Full Code [id number]  Voncile Isles, MD ED   06/22/2018 1822 06/25/2018 2048 Full Code [id number]  Chrystal Collar, MD ED   05/27/2018 1516 06/10/2018 1853 Full Code [id number]   Sheree Penne Bruckner, MD Inpatient   04/02/2018 0931 04/12/2018 1917 Full Code [id number]  Barbarann Nest, MD ED   01/11/2018 1939 01/15/2018 1509 Full Code [id number]  Jacquetta Sharlot GRADE, NP Inpatient   09/08/2017 1554 09/09/2017 1845 Full Code [id number]  Pisciotta, Nat RIGGERS ED   05/18/2017 1701 05/26/2017 1057 Full Code [id number]  Rankin, Shuvon B, NP Inpatient   05/17/2017 2150 05/18/2017 1640 Full Code [id number]  Dreama Longs, MD ED   10/07/2015 2311 10/21/2015 1706 Full Code [id number]  Arno Earleen BRAVO, NP Inpatient   10/07/2015 1619 10/07/2015 2311 Full Code [id number]  Arloa Chroman, PA-C ED   09/11/2014 1516 09/26/2014 2045 Full Code [id number]  Pegge Toribio PARAS, PA-C Inpatient   09/08/2014 1357 09/11/2014 1516 Full Code [id number]  Adina Buel HERO, MD Inpatient   08/28/2014 1435 08/29/2014 2118 Full Code [id number]  Evelena Chauncey BIRCH, MD Inpatient   09/03/2013 2117 09/04/2013 1722 Full Code [id number]  Alto Lynwood SAUNDERS, MD Inpatient   05/22/2013 2346 05/26/2013 2116 Full Code [id number]  [place] Jeoffrey HERO, MD Inpatient   04/17/2012 1735 04/20/2012 2002 Full Code [id number]  Crosson, Sharlet Niemann, RN ED       Prognosis: Poor    Thank you for allowing the Palliative Medicine Team to assist in the care  of this patient.     Camelia Lewis, NP  Please contact Palliative Medicine Team phone at [phone] for questions and concerns.

## 2024-03-19 NOTE — Progress Notes (Signed)
   03/19/24 2144  Spiritual Encounters  Type of Visit Attempt (pt unavailable);Initial  Care provided to: Pt not available  Conversation partners present during encounter Other (comment) (Rapid Response Team)  Referral source Code page  Reason for visit Code  OnCall Visit Yes  Spiritual Framework  Presenting Themes Goals in life/care  Interventions  Spiritual Care Interventions Made Prayer;Other (comment) (Chaplain prayed silently for Pt)  Intervention Outcomes  Outcomes Other (comment) (Compassionate presence)

## 2024-03-19 NOTE — TOC Progression Note (Signed)
 Transition of Care [facility]) - Progression Note    Patient Details  Name: Diamond Rice MRN: [id number] Date of Birth: 1970-09-23  [facility]) CM/SW Contact  Dalia GORMAN Fuse, RN Phone Number: 03/19/2024, 3:53 PM  Clinical Narrative:    TOC visited the patient's room. No family was present. TOC outreached to the patient's brother Lytle [phone] and is awaiting callback. The brother verbalized to the medical team that the plan is for the patient to return to [facility] with Hospice. Referral called to [facility].                    Expected Discharge Plan and Services                                               Social Drivers of Health (SDOH) Interventions SDOH Screenings   Food Insecurity: No Food Insecurity (03/16/2024)  Housing: Unknown (03/16/2024)  Transportation Needs: No Transportation Needs (03/16/2024)  Utilities: Not At Risk (03/16/2024)  Alcohol  Screen: Medium Risk (01/11/2018)  Depression (PHQ2-9): Medium Risk (10/27/2020)  Financial Resource Strain: Medium Risk (08/02/2023)   Received from [facility]  Tobacco Use: High Risk (03/14/2024)    Readmission Risk Interventions     No data to display

## 2024-03-19 NOTE — Progress Notes (Signed)
 [facility] Room 101 [facility] Liaison Note  Referral received from [facility], Zenobia Frazier, RN, to speak with family about hospice follow up.  Voicemail message left with patient's brother, Lytle, asking that he call back to discuss hospice services. Awaiting a return phone call.  Please call with any hospice related questions or concerns.  Thank you for the opportunity to participate in this patient's care  [facility] Liaison [phone]

## 2024-03-19 NOTE — Progress Notes (Signed)
 PROGRESS NOTE    Diamond Rice   FMW:[id number] DOB: 1971-05-19  DOA: 03/14/2024 Date of Service: 03/19/24 which is hospital day 5  PCP: [facility], [place] course / significant events: 53 year old female who unfortunately presented with aortic dissection, deemed not a surgical candidate by multiple surgical consultants, including calls fielded to [facility] and [facility] who recommended medical management, and while in our ICU she developed toxic metabolic encephalopathy and was incoherent. MRI overnight showed multiple strokes that are likely embolic secondary to the dissection flap. She has on clevidipine  gtt with control of her BP, and subsequent transition to orals.   07/18: Found to have Type A Aortic Dissection, Vascular Surgery consulted, recommended transfer to tertiary care however declined by [facility] - recs  medically treating.  PCCM asked to admit, code Status changed to DNR/DNI. 07/19- AKI on ACE inhibitor which we will dc.  BP was better controlled this am. Palliative care following now due to non surgical cancidate with severe aortic dissection.  07/20- weaning off precedex .  Remains on room air.  Renal impairment worse.  Esmolol  drip. ICU team reached out to [facility] cardiothoracic surgery - due to her hemiparesis and bedbound status with comorbidities she is a non operable candidate.  07/21: remains on Esmolol  and Cleviprex  infusions for BP control.  Precedex  is off. Remains Encephalopathic, will obtain MRI Brain.  NG tube placed to be able to start PO antihypertensives in attempt to wean IV infusions.  With worsening AKI and hyperkalemia, shifting measures given.  With worsening Leukocytosis, obtain UA while placing foley. 07/22: MRI overnight concerning for acute multifocal ischemia within both hemispheres. PO Metoprolol  and Hydralazine  does increased, weaned off Esmolol  and Cleviprex  infusions.  Creatinine continues to worsen with persistent mild hyperkalemia, UOP 525 cc last  24 hrs (net +10L). 07/23: hospitalist service assumes care from ICU team. Palliative care team spoke w/ brother - continue treatment for now, hospice to eval, low threshold for comfort measures if she decompensates.     Consultants:  Vascular Surgery  Palliative care   Procedures/Surgeries: none      ASSESSMENT & PLAN:   Type A Aortic Dissection PMHx: HTN, HLD, stroke with right hemiparesis Vascular Surgery consulted, appreciate input ~ no surgical intervention available here at [facility]. Case was also discussed with both [facility] & [facility] ~ deemed to be VERY POOR SURGICAL CANDIDATE and refused transfer ~ recommended medical management Was in ICU on Esmolol  and Nicardipine  drips ~ weaned off Continuous cardiac monitoring Maintain HR <60 & SBP 100-120 Prn Hydralazine  IV Metoprolol , Hydralazine  per NG tube, continue clonidine  patch Pain control  COPD without acute exacerbation OSA Supplemental O2 as needed to maintain O2 sats 88 to 92% Follow intermittent Chest X-ray & ABG as needed Bronchodilators prn Pulmonary toilet as able   Acute Kidney Injury Hyperkalemia - improved/resolved Monitor I&O's / urinary output Follow BMP Ensure adequate renal perfusion Avoid nephrotoxic agents as able Replace electrolytes as indicated ~ Pharmacy following for assistance with electrolyte replacement   Anemia Monitor for S/Sx of bleeding Trend CBC SCD's for VTE Prophylaxis  Transfuse for Hgb <7   UTI recent E. Coli UTI (ESBL) 03/05/24 Monitor fever curve Follow cultures as above Continue empiric Zosyn  pending cultures & sensitivities   Diabetes Mellitus Type II CBG's q4h; Target range of 140 to 180 SSI Follow Hypo/Hyperglycemia protocol   Acute Metabolic Encephalopathy Acute CVA's ~ suspect etiology is from Aortic Dissection  PMHx: CVA with right sided hemiparesis  MRI Brain 7/21 with multifocal acute ischemia within both hemispheres affecting the MCA and PCA  territories, no midline shift or mass effect noted.  Multifocal central chronic microhemorrhage most consistent with chronic hypertensive angiopathy . Multifocal hyperintense T2-weighted signal within the cerebral white matter, most commonly due to chronic small vessel disease. Treatment of metabolic derangements and pain as outlined above Provide supportive care Promote normal sleep/wake cycle and family presence Avoid sedating medications as able Held off on consulting Neurology currently as would likely not change management nor outcome given poor prognosis due to  Aortic Dissection not amendable to surgery and with worsening AKI   Class 3 obesity based on BMI: Body mass index is 44.49 kg/m.SABRA Significantly low or high BMI is associated with higher medical risk.  Underweight - under 18  overweight - 25 to 29 obese - 30 or more Class 1 obesity: BMI of 30.0 to 34 Class 2 obesity: BMI of 35.0 to 39 Class 3 obesity: BMI of 40.0 to 49 Super Morbid Obesity: BMI 50-59 Super-super Morbid Obesity: BMI 60+ Healthy nutrition and physical activity advised as adjunct to other disease management and risk reduction treatments    DVT prophylaxis: no anticoagulation d/t bleed risk IV fluids: no continuous IV fluids  Nutrition: tube feeds via NG Central lines / other devices: NG, Foley  Code Status: DNR/DNI ACP documentation reviewed:  none on file in [facility]  TOC needs: TBD, expect to facility w/ hospice  Medical barriers to dispo: GOC discussions, high risk decompensation and may need to discuss comfort measures only. Expected medical readiness for discharge few days / if stabilizes.              Subjective / Brief ROS:  Patient noncontirbutory, she opens eyes to voice but is not responding otherwise   Family Communication: left HIPAA compliant voicemail w/ brother's phone 03/19/24 3:10 PM     Objective Findings:  Vitals:   03/19/24 0047 03/19/24 0504 03/19/24 0909 03/19/24 1245   BP: (!) 116/57 123/71 108/60 (!) 107/52  Pulse: 92 97 96 78  Resp: (!) 26 18 18 20   Temp: 98 F (36.7 C) 98 F (36.7 C) 98.9 F (37.2 C) 97.7 F (36.5 C)  TempSrc:  Axillary  Axillary  SpO2:  96% 98%   Weight:  106.8 kg    Height:        Intake/Output Summary (Last 24 hours) at 03/19/2024 1510 Last data filed at 03/19/2024 1150 Gross per 24 hour  Intake 218.31 ml  Output 80 ml  Net 138.31 ml   Filed Weights   03/17/24 0500 03/18/24 0357 03/19/24 0504  Weight: 125.4 kg 126 kg 106.8 kg    Examination:  Physical Exam Constitutional:      General: She is not in acute distress. Cardiovascular:     Rate and Rhythm: Normal rate.  Pulmonary:     Effort: Pulmonary effort is normal.  Abdominal:     Palpations: Abdomen is soft.  Musculoskeletal:     Right lower leg: No edema.     Left lower leg: No edema.  Skin:    General: Skin is warm and dry.  Neurological:     Mental Status: Mental status is at baseline.          Scheduled Medications:   amLODipine   10 mg Per Tube Daily   Chlorhexidine  Gluconate Cloth  6 each Topical Daily   cloNIDine   0.3 mg Transdermal Weekly   docusate  50 mg Per Tube BID  FLUoxetine   80 mg Per Tube Daily   free water   30 mL Per Tube Q4H   hydrALAZINE   75 mg Per Tube Q6H   insulin  aspart  0-9 Units Subcutaneous Q4H   metoprolol  tartrate  50 mg Per Tube Q8H   nutrition supplement (JUVEN)  1 packet Per Tube BID BM   mouth rinse  15 mL Mouth Rinse 4 times per day   polyethylene glycol  17 g Per Tube Daily   thiamine   100 mg Per Tube Daily    Continuous Infusions:  feeding supplement (NEPRO CARB STEADY) 1,000 mL (03/19/24 0040)   piperacillin -tazobactam (ZOSYN )  IV 3.375 g (03/19/24 0918)    PRN Medications:  diazepam , docusate sodium , hydrALAZINE , HYDROcodone -acetaminophen , HYDROmorphone  (DILAUDID ) injection, mouth rinse, polyethylene glycol  Antimicrobials from admission:  Anti-infectives (From admission, onward)    Start      Dose/Rate Route Frequency Ordered Stop   03/17/24 1545  piperacillin -tazobactam (ZOSYN ) IVPB 3.375 g        3.375 g 12.5 mL/hr over 240 Minutes Intravenous Every 12 hours 03/17/24 1458     03/17/24 1530  cefTRIAXone  (ROCEPHIN ) 1 g in sodium chloride  0.9 % 100 mL IVPB  Status:  Discontinued        1 g 200 mL/hr over 30 Minutes Intravenous Every 24 hours 03/17/24 1430 03/17/24 1431           Data Reviewed:  I have personally reviewed the following...  CBC: Recent Labs  Lab 03/14/24 0418 03/15/24 0626 03/16/24 0804 03/17/24 0422 03/18/24 0504 03/19/24 0542  WBC 11.6* 10.7* 17.9* 20.7* 24.8* 23.4*  NEUTROABS 8.4*  --   --   --   --   --   HGB 10.1* 9.6* 9.3* 8.3* 9.5* 8.9*  HCT 32.5* 30.5* 29.7* 26.3* 30.0* 27.3*  MCV 76.5* 76.8* 75.8* 76.2* 75.0* 72.8*  PLT 276 225 249 204 255 280   Basic Metabolic Panel: Recent Labs  Lab 03/14/24 0418 03/15/24 0626 03/16/24 0804 03/16/24 1428 03/16/24 2124 03/17/24 0422 03/17/24 1234 03/18/24 0504 03/19/24 0542  NA 141 140 135   < > 139 136 138 139 137  K 2.9* 4.4 5.7*   < > 5.7* 5.7* 5.5* 5.6* 5.1  CL 103 112* 109   < > 110 109 109 105 106  CO2 28 23 16*   < > 19* 19* 21* 19* 20*  GLUCOSE 261* 121* 125*   < > 124* 103* 136* 130* 153*  BUN 9 15 22*   < > 25* 27* 29* 32* 45*  CREATININE 1.41* 2.22* 3.16*   < > 3.77* 3.99* 4.18* 4.48* 4.98*  CALCIUM  9.3 8.6* 8.9   < > 8.5* 8.5* 8.5* 8.7* 9.0  MG 2.0 1.9  --   --   --   --   --  2.1  --   PHOS 2.8 3.4 4.8*  --   --  5.2*  --  5.9* 6.0*   < > = values in this interval not displayed.   GFR: Estimated Creatinine Clearance: 15.1 mL/min (A) (by C-G formula based on SCr of 4.98 mg/dL (H)). Liver Function Tests: Recent Labs  Lab 03/14/24 0418 03/15/24 0626 03/16/24 0804 03/17/24 0422 03/18/24 0504 03/19/24 0542  AST 16  --   --   --   --   --   ALT 14  --   --   --   --   --   ALKPHOS 95  --   --   --   --   --  BILITOT 0.5  --   --   --   --   --   PROT 7.5  --   --   --    --   --   ALBUMIN 3.0* 2.5* 2.2* 2.0* 2.0* 2.0*   Recent Labs  Lab 03/14/24 0418  LIPASE 30   No results for input(s): AMMONIA in the last 168 hours. Coagulation Profile: No results for input(s): INR, PROTIME in the last 168 hours. Cardiac Enzymes: No results for input(s): CKTOTAL, CKMB, CKMBINDEX, TROPONINI in the last 168 hours. BNP (last 3 results) No results for input(s): PROBNP in the last 8760 hours. HbA1C: No results for input(s): HGBA1C in the last 72 hours. CBG: Recent Labs  Lab 03/18/24 2158 03/19/24 0042 03/19/24 0511 03/19/24 0724 03/19/24 1201  GLUCAP 134* 142* 136* 162* 168*   Lipid Profile: Recent Labs    03/18/24 0504  TRIG 107   Thyroid Function Tests: No results for input(s): TSH, T4TOTAL, FREET4, T3FREE, THYROIDAB in the last 72 hours. Anemia Panel: No results for input(s): VITAMINB12, FOLATE, FERRITIN, TIBC, IRON, RETICCTPCT in the last 72 hours. Most Recent Urinalysis On File:     Component Value Date/Time   COLORURINE YELLOW (A) 03/17/2024 1234   APPEARANCEUR CLOUDY (A) 03/17/2024 1234   LABSPEC >1.046 (H) 03/17/2024 1234   PHURINE 5.0 03/17/2024 1234   GLUCOSEU NEGATIVE 03/17/2024 1234   HGBUR MODERATE (A) 03/17/2024 1234   BILIRUBINUR NEGATIVE 03/17/2024 1234   KETONESUR NEGATIVE 03/17/2024 1234   PROTEINUR >=300 (A) 03/17/2024 1234   UROBILINOGEN 0.2 08/28/2014 1143   NITRITE NEGATIVE 03/17/2024 1234   LEUKOCYTESUR NEGATIVE 03/17/2024 1234   Sepsis Labs: @LABRCNTIP (procalcitonin:4,lacticidven:4) Microbiology: Recent Results (from the past 240 hours)  MRSA Next Gen by PCR, Nasal     Status: None   Collection Time: 03/14/24  8:29 AM   Specimen: Nasal Mucosa; Nasal Swab  Result Value Ref Range Status   MRSA by PCR Next Gen NOT DETECTED NOT DETECTED Final    Comment: (NOTE) The GeneXpert MRSA Assay (FDA approved for NASAL specimens only), is one component of a comprehensive MRSA colonization  surveillance program. It is not intended to diagnose MRSA infection nor to guide or monitor treatment for MRSA infections. Test performance is not FDA approved in patients less than 91 years old. Performed at [facility], [address] [place], [place] [address]   MRSA Next Gen by PCR, Nasal     Status: None   Collection Time: 03/14/24 11:11 AM   Specimen: Nasal Mucosa; Nasal Swab  Result Value Ref Range Status   MRSA by PCR Next Gen NOT DETECTED NOT DETECTED Final    Comment: (NOTE) The GeneXpert MRSA Assay (FDA approved for NASAL specimens only), is one component of a comprehensive MRSA colonization surveillance program. It is not intended to diagnose MRSA infection nor to guide or monitor treatment for MRSA infections. Test performance is not FDA approved in patients less than 17 years old. Performed at [facility], [address], [place], [place] [address]       Radiology Studies last 3 days: DG Abd 1 View Result Date: 03/18/2024 CLINICAL DATA:  NG tube placement EXAM: ABDOMEN - 1 VIEW COMPARISON:  03/17/2024 FINDINGS: Enteric tube tip and side port overlie the proximal stomach. Upper gas pattern is unremarkable. IMPRESSION: Enteric tube tip and side port overlie the proximal stomach. Electronically Signed   By: Luke Bun M.D.   On: 03/18/2024 17:48   MR BRAIN WO CONTRAST  Result Date: 03/17/2024 EXAM: MRI BRAIN WITHOUT CONTRAST 03/17/2024 10:44:29 PM TECHNIQUE: Multiplanar multisequence MRI of the head/brain was performed without the administration of intravenous contrast. COMPARISON: 01/17/2021 CLINICAL HISTORY: Mental status change, unknown cause. AMS. Type A dissection. FINDINGS: BRAIN AND VENTRICLES: Multifocal acute ischemia within both hemispheres affecting the middle and posterior cerebral artery territories. The largest lesion is in the left frontal lobe. No midline shift or other mass effect. Multifocal central chronic micro hemorrhage. Multifocal  hyperintense T2-weighted signal within the cerebral white matter, most commonly due to chronic small vessel disease. The sella is unremarkable. Normal flow voids. ORBITS: No acute abnormality. SINUSES AND MASTOIDS: No acute abnormality. BONES AND SOFT TISSUES: Normal marrow signal. No acute soft tissue abnormality. IMPRESSION: 1. Multifocal acute ischemia within both hemispheres affecting the middle and posterior cerebral artery territories, with the largest lesion in the left frontal lobe. No midline shift or other mass effect. 2. Multifocal central chronic microhemorrhage most consistent with chronic hypertensive angiopathy . 3. Multifocal hyperintense T2-weighted signal within the cerebral white matter, most commonly due to chronic small vessel disease. Electronically signed by: Franky Stanford MD 03/17/2024 11:36 PM EDT RP Workstation: [id number]   DG Skull 1-3 Views Result Date: 03/17/2024 CLINICAL DATA:  Metabolic encephalopathy, MRI EXAM: SKULL - 1-3 VIEW COMPARISON:  None Available. FINDINGS: AP and cross-table lateral views of the skull demonstrate nasogastric tube, incompletely assessed. No fracture or malalignment. Dental amalgam. No unexpected metallic density foreign body IMPRESSION: Negative for unexpected metallic density foreign body. Electronically Signed   By: Luke Bun M.D.   On: 03/17/2024 15:18   DG Abd 1 View Result Date: 03/17/2024 CLINICAL DATA:  Enteric tube placement EXAM: ABDOMEN - 1 VIEW COMPARISON:  CT abdomen and pelvis dated 03/14/2024 FINDINGS: Gastric/enteric tube tip projects over the stomach. Partially imaged nonobstructive bowel gas pattern. Left retrocardiac linear opacities. IMPRESSION: Gastric/enteric tube tip projects over the stomach. Electronically Signed   By: Limin  Xu M.D.   On: 03/17/2024 08:55       Time spent: 50 min     Iosefa Weintraub, DO [facility] 03/19/2024, 3:10 PM    Dictation software may have been used to generate the above  note. Typos may occur and escape review in typed/dictated notes. Please contact Dr Marsa directly for clarity if needed.  Staff may message me via secure chat in Epic  but this may not receive an immediate response,  please page me for urgent matters!  If 7PM-7AM, please contact night coverage [email or web address]

## 2024-03-19 NOTE — Progress Notes (Signed)
 Rapid Response Event Note   Reason for Call : respiratory distress   Initial Focused Assessment:  Patient with respirations 35-40. Charge nurse at bedside     Interventions:  Patient is DNR/DNI, placed on venti mask. Unable to place bipap at this time due to pt lethargic and green mitts on (previously pulled NG out). Labs including VBG ordered to assess  Plan of Care:  Transferred to SD (no beds on PCU). Continue to monitor   Event Summary:   MD Notified: RR called, also secure chat to come to room Call Time:2142 Arrival Time:2147 End Time:2220  Suzen LITTIE Primer, RN

## 2024-03-19 NOTE — Progress Notes (Signed)
 Late entry: called by covering RN to place patient on BIPAP that was ordered 7/22 due to increased wob and tachypnea. Unable to come at that time because of emergency traffic in emergency department. Covering RN called me back and I learned that patient is unable to remove BIPAP mask therefore BIPAP is contraindicated at this time unless she has a Comptroller. Rapid Response was called. Once MD arrived at bedside, he wanted the patient on a venti mask, placed on 31% 4L,  VBG was ordered, and patient is being transferred to step down. Report given to ICU therapist. No further intervention at this time.

## 2024-03-19 NOTE — Plan of Care (Signed)

## 2024-03-20 DIAGNOSIS — I7100 Dissection of unspecified site of aorta: Secondary | ICD-10-CM | POA: Diagnosis not present

## 2024-03-20 LAB — CBC
HCT: 25.5 % — ABNORMAL LOW (ref 36.0–46.0)
Hemoglobin: 8.3 g/dL — ABNORMAL LOW (ref 12.0–15.0)
MCH: 24.1 pg — ABNORMAL LOW (ref 26.0–34.0)
MCHC: 32.5 g/dL (ref 30.0–36.0)
MCV: 74.1 fL — ABNORMAL LOW (ref 80.0–100.0)
Platelets: 284 K/uL (ref 150–400)
RBC: 3.44 MIL/uL — ABNORMAL LOW (ref 3.87–5.11)
RDW: 17.7 % — ABNORMAL HIGH (ref 11.5–15.5)
WBC: 19.9 K/uL — ABNORMAL HIGH (ref 4.0–10.5)
nRBC: 1 % — ABNORMAL HIGH (ref 0.0–0.2)

## 2024-03-20 LAB — RENAL FUNCTION PANEL
Albumin: 2 g/dL — ABNORMAL LOW (ref 3.5–5.0)
Anion gap: 11 (ref 5–15)
BUN: 63 mg/dL — ABNORMAL HIGH (ref 6–20)
CO2: 21 mmol/L — ABNORMAL LOW (ref 22–32)
Calcium: 9.1 mg/dL (ref 8.9–10.3)
Chloride: 106 mmol/L (ref 98–111)
Creatinine, Ser: 5.25 mg/dL — ABNORMAL HIGH (ref 0.44–1.00)
GFR, Estimated: 9 mL/min — ABNORMAL LOW (ref >60)
Glucose, Bld: 194 mg/dL — ABNORMAL HIGH (ref 70–99)
Phosphorus: 4.7 mg/dL — ABNORMAL HIGH (ref 2.5–4.6)
Potassium: 4.6 mmol/L (ref 3.5–5.1)
Sodium: 138 mmol/L (ref 135–145)

## 2024-03-20 LAB — URINE CULTURE: Culture: 5000 — AB

## 2024-03-20 LAB — GLUCOSE, CAPILLARY
Glucose-Capillary: 264 mg/dL — ABNORMAL HIGH (ref 70–99)
Glucose-Capillary: 248 mg/dL — ABNORMAL HIGH (ref 70–99)
Glucose-Capillary: 220 mg/dL — ABNORMAL HIGH (ref 70–99)

## 2024-03-20 LAB — BLOOD GAS, VENOUS
Acid-base deficit: 5.2 mmol/L — ABNORMAL HIGH (ref 0.0–2.0)
Bicarbonate: 22.4 mmol/L (ref 20.0–28.0)
FIO2: 31 %
O2 Saturation: 87.6 %
Patient temperature: 37
pCO2, Ven: 51 mmHg (ref 44–60)
pH, Ven: 7.25 (ref 7.25–7.43)
pO2, Ven: 55 mmHg — ABNORMAL HIGH (ref 32–45)

## 2024-03-20 MED ORDER — HALOPERIDOL LACTATE 2 MG/ML PO CONC: 0.5000 mg | ORAL | Status: DC | PRN

## 2024-03-20 MED ORDER — LORAZEPAM 2 MG/ML IJ SOLN
INTRAMUSCULAR | Status: AC
  Administered 2024-03-20: 0.5 mg
  Filled 2024-03-20: qty 1

## 2024-03-20 MED ORDER — LORAZEPAM 2 MG/ML IJ SOLN
1.0000 mg | INTRAMUSCULAR | Status: DC | PRN
  Administered 2024-03-20: 1 mg via INTRAVENOUS
  Filled 2024-03-20: qty 1

## 2024-03-20 MED ORDER — ARTIFICIAL TEARS OPHTHALMIC OINT: TOPICAL_OINTMENT | OPHTHALMIC | Status: DC | PRN

## 2024-03-20 MED ORDER — ONDANSETRON HCL 4 MG/2ML IJ SOLN: 4.0000 mg | Freq: Four times a day (QID) | INTRAMUSCULAR | Status: DC | PRN

## 2024-03-20 MED ORDER — LORAZEPAM 2 MG/ML IJ SOLN: 0.5000 mg | Freq: Once | INTRAMUSCULAR | Status: AC

## 2024-03-20 MED ORDER — ONDANSETRON 4 MG PO TBDP: 4.0000 mg | ORAL_TABLET | Freq: Four times a day (QID) | ORAL | Status: DC | PRN

## 2024-03-20 MED ORDER — POLYVINYL ALCOHOL 1.4 % OP SOLN: 1.0000 [drp] | Freq: Four times a day (QID) | OPHTHALMIC | Status: DC | PRN

## 2024-03-20 MED ORDER — HALOPERIDOL LACTATE 5 MG/ML IJ SOLN: 0.5000 mg | INTRAMUSCULAR | Status: DC | PRN

## 2024-03-20 MED ORDER — FENTANYL 2500MCG IN NS 250ML (10MCG/ML) PREMIX INFUSION
50.0000 ug/h | INTRAVENOUS | Status: DC
  Administered 2024-03-20: 50 ug/h via INTRAVENOUS
  Administered 2024-03-21: 150 ug/h via INTRAVENOUS
  Filled 2024-03-20 (×2): qty 250

## 2024-03-20 MED ORDER — FENTANYL BOLUS VIA INFUSION
50.0000 ug | INTRAVENOUS | Status: DC | PRN
  Administered 2024-03-20 (×4): 100 ug via INTRAVENOUS
  Administered 2024-03-20: 50 ug via INTRAVENOUS

## 2024-03-20 MED ORDER — LORAZEPAM 1 MG PO TABS: 1.0000 mg | ORAL_TABLET | ORAL | Status: DC | PRN

## 2024-03-20 MED ORDER — HALOPERIDOL 0.5 MG PO TABS: 0.5000 mg | ORAL_TABLET | ORAL | Status: DC | PRN

## 2024-03-20 MED ORDER — LORAZEPAM 2 MG/ML PO CONC: 1.0000 mg | ORAL | Status: DC | PRN

## 2024-03-20 NOTE — Progress Notes (Signed)
 Received patient after Rapid Response called on the floor. Orders received to place patient on Bipap. Spoke with RN about needing a sitter present at bedside due to the fact that the patient is in green mits and cannot remove mask if needed. RN states that the sitter order has been placed. Placed patient on Bipap 18/8 r-12 32%. Currently tolerating well. Will continue to monitor.

## 2024-03-20 NOTE — Progress Notes (Signed)
 Per Dr.Mansy, Brother has been updated of patients current status. Brother states he understands that her prognosis is guarded and if she cannot tolerate BiPAP she will go into respiratory failure. He wants us  to continue managing tonight. He understands that if she cannot tolerate BiPAP we will go for comfort measures.Will cont. To monitor.  Laymon Butler PEAK 03/20/2024

## 2024-03-20 NOTE — Progress Notes (Signed)
 Pt to transfer to 1c-121. Pt on Fentanyl  drip for comfort care measures. Report given to [facility]. Caregiver at bedside. All belongings sent with pt.

## 2024-03-20 NOTE — Plan of Care (Signed)
  Problem: Skin Integrity: Goal: Risk for impaired skin integrity will decrease Outcome: Progressing   Problem: Clinical Measurements: Goal: Respiratory complications will improve Outcome: Not Progressing Goal: Cardiovascular complication will be avoided Outcome: Not Progressing   Problem: Activity: Goal: Risk for activity intolerance will decrease Outcome: Not Progressing   Problem: Nutrition: Goal: Adequate nutrition will be maintained Outcome: Not Progressing

## 2024-03-20 NOTE — Progress Notes (Signed)
   03/20/24 1230  Spiritual Encounters  Type of Visit Initial  Care provided to: Patient  Conversation partners present during encounter Nurse  Referral source Other (comment) Training and development officer)  Reason for visit Routine spiritual support  OnCall Visit Yes   Chaplain was rounding on the Unit and Secretary mentioned this patient being on comfort care.  Chaplain looked into the room to see if family was present to offer spiritual care but no family was present.    Rev. Rana M. Nicholaus, M.Div. Chaplain Resident  [facility]

## 2024-03-20 NOTE — Progress Notes (Signed)
 [facility] Room IC12 [facility] Hospice Liaison Note  Hospital Liaison team will follow peripherally at this time.    Please call with any hospice needs or concerns.  Thank you for the opportunity to participate in this patient's care  [facility] Liaison [phone]

## 2024-03-20 NOTE — Progress Notes (Signed)
 PROGRESS NOTE    Diamond Rice   FMW:[id number] DOB: 10/17/1970  DOA: 03/14/2024 Date of Service: 03/20/24 which is hospital day 6  PCP: [facility], [facility] course / significant events: 53 year old female who unfortunately presented with aortic dissection, deemed not a surgical candidate by multiple surgical consultants, including calls fielded to [facility] and [facility] who recommended medical management, and while in our ICU she developed toxic metabolic encephalopathy and was incoherent. MRI overnight showed multiple strokes that are likely embolic secondary to the dissection flap. She has on clevidipine  gtt with control of her BP, and subsequent transition to orals.   07/18: Found to have Type A Aortic Dissection, Vascular Surgery consulted, recommended transfer to tertiary care however declined by [facility] - recs  medically treating.  PCCM asked to admit, code Status changed to DNR/DNI. 07/19- AKI on ACE inhibitor which we will dc.  BP was better controlled this am. Palliative care following now due to non surgical cancidate with severe aortic dissection.  07/20- weaning off precedex .  Remains on room air.  Renal impairment worse.  Esmolol  drip. ICU team reached out to [facility] cardiothoracic surgery - due to her hemiparesis and bedbound status with comorbidities she is a non operable candidate.  07/21: remains on Esmolol  and Cleviprex  infusions for BP control.  Precedex  is off. Remains Encephalopathic, will obtain MRI Brain.  NG tube placed to be able to start PO antihypertensives in attempt to wean IV infusions.  With worsening AKI and hyperkalemia, shifting measures given.  With worsening Leukocytosis, obtain UA while placing foley. 07/22: MRI overnight concerning for acute multifocal ischemia within both hemispheres. PO Metoprolol  and Hydralazine  does increased, weaned off Esmolol  and Cleviprex  infusions.  Creatinine continues to worsen with persistent mild hyperkalemia, UOP 525 cc last  24 hrs (net +10L). 07/23: hospitalist service assumes care from ICU team. Palliative care team spoke w/ brother - continue treatment for now, hospice to eval, low threshold for comfort measures if she decompensates.  07/24: resp distress overnight, on BiPAP - Pt not tolerating BiPAP and in distress/agitated. Called brother in patient's room, paliative care present and pt's friend present - agreement w/ brother for comfort measures, DC aggressive interventions.     Consultants:  Vascular Surgery  Palliative care   Procedures/Surgeries: none      ASSESSMENT & PLAN:   COMFORT MEASURES STATUS STOP non-comfort interventions Fentanyl  gtt and once comfortable can remove BiPAP    Type A Aortic Dissection PMHx: HTN, HLD, stroke with right hemiparesis Vascular Surgery consulted, appreciate input ~ no surgical intervention available here at [facility]. Case was also discussed with both [facility] & [facility] Cardioth[facility]acic [facility] ~ deemed to be VERY POOR SURGICAL CANDIDATE and refused transfer ~ recommended medical management Was in ICU on Esmolol  and Nicardipine  drips ~ weaned off D/c treatments on comfort measures   COPD without acute exacerbation OSA Acute Kidney Injury Hyperkalemia - improved/resolved Anemia  UTI recent E. Coli UTI (ESBL) 03/05/24 Diabetes Mellitus Type II Acute Metabolic Encephalopathy Acute CVA's ~ suspect etiology is from Aortic Dissection  PMHx: CVA with right sided hemiparesis MRI Brain 7/21 with multifocal acute ischemia within both hemispheres affecting the MCA and PCA territories, no midline shift or mass effect noted.  Multifocal central chronic microhemorrhage most consistent with chronic hypertensive angiopathy . Multifocal hyperintense T2-weighted signal within the cerebral white matter, most commonly due to chronic small vessel disease. D/c treatments on comfort measures    Class 3 obesity based on BMI:  Body mass index is 44.49 kg/m.SABRA Significantly low or high BMI  is associated with higher medical risk.  Underweight - under 18  overweight - 25 to 29 obese - 30 or more Class 1 obesity: BMI of 30.0 to 34 Class 2 obesity: BMI of 35.0 to 39 Class 3 obesity: BMI of 40.0 to 49 Super Morbid Obesity: BMI 50-59 Super-super Morbid Obesity: BMI 60+ Healthy nutrition and physical activity advised as adjunct to other disease management and risk reduction treatments    DVT prophylaxis: no anticoagulation on comfort measures IV fluids: no continuous IV fluids on comfort measures Nutrition: as desired but pt encephalopathic, would leave NPO  Central lines / other devices: NG can remove, Foley ca nleave in place  Code Status: DNR/DNI ACP documentation reviewed:  none on file in [facility]  TOC needs: TBD, expect hospital death              Subjective / Brief ROS:  Patient noncontirbutory, she opens eyes to voice but is not responding otherwise   Family Communication: see above -spoke w/ brother on phone today 10:30. Diamond Rice is also present at bedside     Objective Findings:  Vitals:   03/20/24 0900 03/20/24 0915 03/20/24 0930 03/20/24 1000  BP: (!) 121/105 (!) 136/105 (!) 124/101 132/64  Pulse: 94 94 94 93  Resp: (!) 24 (!) 24 (!) 28 (!) 29  Temp:      TempSrc:      SpO2: 98% 97% 98% 96%  Weight:      Height:        Intake/Output Summary (Last 24 hours) at 03/20/2024 1041 Last data filed at 03/20/2024 0810 Gross per 24 hour  Intake 120 ml  Output 400 ml  Net -280 ml   Filed Weights   03/17/24 0500 03/18/24 0357 03/19/24 0504  Weight: 125.4 kg 126 kg 106.8 kg    Examination:  Physical Exam Constitutional:      General: She is not in acute distress.    Appearance: She is ill-appearing.  Cardiovascular:     Rate and Rhythm: Normal rate.  Pulmonary:     Effort: Respiratory distress present.  Abdominal:     Palpations: Abdomen is soft.  Musculoskeletal:     Right lower leg: No edema.     Left lower leg: No edema.   Skin:    General: Skin is warm and dry.  Neurological:     Mental Status: She is lethargic and disoriented.  Psychiatric:        Behavior: Behavior is agitated.          Scheduled Medications:   amLODipine   10 mg Per Tube Daily   Chlorhexidine  Gluconate Cloth  6 each Topical Daily   cloNIDine   0.3 mg Transdermal Weekly   docusate  50 mg Per Tube BID   FLUoxetine   80 mg Per Tube Daily   free water   30 mL Per Tube Q4H   hydrALAZINE   75 mg Per Tube Q6H   insulin  aspart  0-9 Units Subcutaneous Q6H   metoprolol  tartrate  50 mg Per Tube Q8H   nutrition supplement (JUVEN)  1 packet Per Tube BID BM   mouth rinse  15 mL Mouth Rinse 4 times per day   polyethylene glycol  17 g Per Tube Daily   thiamine   100 mg Per Tube Daily    Continuous Infusions:  feeding supplement (NEPRO CARB STEADY) 1,000 mL (03/19/24 1852)   piperacillin -tazobactam (ZOSYN )  IV 3.375 g (03/19/24  2159)    PRN Medications:  artificial tears, diazepam , docusate sodium , hydrALAZINE , HYDROcodone -acetaminophen , HYDROmorphone  (DILAUDID ) injection, mouth rinse, polyethylene glycol  Antimicrobials from admission:  Anti-infectives (From admission, onward)    Start     Dose/Rate Route Frequency Ordered Stop   03/17/24 1545  piperacillin -tazobactam (ZOSYN ) IVPB 3.375 g        3.375 g 12.5 mL/hr over 240 Minutes Intravenous Every 12 hours 03/17/24 1458     03/17/24 1530  cefTRIAXone  (ROCEPHIN ) 1 g in sodium chloride  0.9 % 100 mL IVPB  Status:  Discontinued        1 g 200 mL/hr over 30 Minutes Intravenous Every 24 hours 03/17/24 1430 03/17/24 1431           Data Reviewed:  I have personally reviewed the following...  CBC: Recent Labs  Lab 03/14/24 0418 03/15/24 0626 03/17/24 0422 03/18/24 0504 03/19/24 0542 03/19/24 2256 03/20/24 0753  WBC 11.6*   < > 20.7* 24.8* 23.4* 21.9* 19.9*  NEUTROABS 8.4*  --   --   --   --   --   --   HGB 10.1*   < > 8.3* 9.5* 8.9* 8.7* 8.3*  HCT 32.5*   < > 26.3* 30.0*  27.3* 27.4* 25.5*  MCV 76.5*   < > 76.2* 75.0* 72.8* 73.3* 74.1*  PLT 276   < > 204 255 280 299 284   < > = values in this interval not displayed.   Basic Metabolic Panel: Recent Labs  Lab 03/14/24 0418 03/15/24 0626 03/16/24 0804 03/16/24 1428 03/17/24 0422 03/17/24 1234 03/18/24 0504 03/19/24 0542 03/19/24 2256 03/20/24 0753  NA 141 140 135   < > 136 138 139 137 137 138  K 2.9* 4.4 5.7*   < > 5.7* 5.5* 5.6* 5.1 5.3* 4.6  CL 103 112* 109   < > 109 109 105 106 106 106  CO2 28 23 16*   < > 19* 21* 19* 20* 19* 21*  GLUCOSE 261* 121* 125*   < > 103* 136* 130* 153* 252* 194*  BUN 9 15 22*   < > 27* 29* 32* 45* 60* 63*  CREATININE 1.41* 2.22* 3.16*   < > 3.99* 4.18* 4.48* 4.98* 5.34* 5.25*  CALCIUM  9.3 8.6* 8.9   < > 8.5* 8.5* 8.7* 9.0 9.2 9.1  MG 2.0 1.9  --   --   --   --  2.1  --   --   --   PHOS 2.8 3.4 4.8*  --  5.2*  --  5.9* 6.0*  --  4.7*   < > = values in this interval not displayed.   GFR: Estimated Creatinine Clearance: 14.3 mL/min (A) (by C-G formula based on SCr of 5.25 mg/dL (H)). Liver Function Tests: Recent Labs  Lab 03/14/24 0418 03/15/24 0626 03/17/24 0422 03/18/24 0504 03/19/24 0542 03/19/24 2256 03/20/24 0753  AST 16  --   --   --   --  24  --   ALT 14  --   --   --   --  27  --   ALKPHOS 95  --   --   --   --  71  --   BILITOT 0.5  --   --   --   --  0.5  --   PROT 7.5  --   --   --   --  6.5  --   ALBUMIN 3.0*   < > 2.0* 2.0* 2.0* 2.1*  2.0*   < > = values in this interval not displayed.   Recent Labs  Lab 03/14/24 0418  LIPASE 30   No results for input(s): AMMONIA in the last 168 hours. Coagulation Profile: No results for input(s): INR, PROTIME in the last 168 hours. Cardiac Enzymes: No results for input(s): CKTOTAL, CKMB, CKMBINDEX, TROPONINI in the last 168 hours. BNP (last 3 results) No results for input(s): PROBNP in the last 8760 hours. HbA1C: No results for input(s): HGBA1C in the last 72 hours. CBG: Recent Labs   Lab 03/19/24 0724 03/19/24 1201 03/19/24 1833 03/20/24 0119 03/20/24 0502  GLUCAP 162* 168* 246* 248* 220*   Lipid Profile: Recent Labs    03/18/24 0504  TRIG 107   Thyroid Function Tests: No results for input(s): TSH, T4TOTAL, FREET4, T3FREE, THYROIDAB in the last 72 hours. Anemia Panel: No results for input(s): VITAMINB12, FOLATE, FERRITIN, TIBC, IRON, RETICCTPCT in the last 72 hours. Most Recent Urinalysis On File:     Component Value Date/Time   COLORURINE YELLOW (A) 03/17/2024 1234   APPEARANCEUR CLOUDY (A) 03/17/2024 1234   LABSPEC >1.046 (H) 03/17/2024 1234   PHURINE 5.0 03/17/2024 1234   GLUCOSEU NEGATIVE 03/17/2024 1234   HGBUR MODERATE (A) 03/17/2024 1234   BILIRUBINUR NEGATIVE 03/17/2024 1234   KETONESUR NEGATIVE 03/17/2024 1234   PROTEINUR >=300 (A) 03/17/2024 1234   UROBILINOGEN 0.2 08/28/2014 1143   NITRITE NEGATIVE 03/17/2024 1234   LEUKOCYTESUR NEGATIVE 03/17/2024 1234   Sepsis Labs: @LABRCNTIP (procalcitonin:4,lacticidven:4) Microbiology: Recent Results (from the past 240 hours)  MRSA Next Gen by PCR, Nasal     Status: None   Collection Time: 03/14/24  8:29 AM   Specimen: Nasal Mucosa; Nasal Swab  Result Value Ref Range Status   MRSA by PCR Next Gen NOT DETECTED NOT DETECTED Final    Comment: (NOTE) The GeneXpert MRSA Assay (FDA approved for NASAL specimens only), is one component of a comprehensive MRSA colonization surveillance program. It is not intended to diagnose MRSA infection nor to guide or monitor treatment for MRSA infections. Test performance is not FDA approved in patients less than 80 years old. Performed at [facility], [address] [place], [place] [address]   MRSA Next Gen by PCR, Nasal     Status: None   Collection Time: 03/14/24 11:11 AM   Specimen: Nasal Mucosa; Nasal Swab  Result Value Ref Range Status   MRSA by PCR Next Gen NOT DETECTED NOT DETECTED Final    Comment: (NOTE) The  GeneXpert MRSA Assay (FDA approved for NASAL specimens only), is one component of a comprehensive MRSA colonization surveillance program. It is not intended to diagnose MRSA infection nor to guide or monitor treatment for MRSA infections. Test performance is not FDA approved in patients less than 7 years old. Performed at [facility], [address], [place], [place] [address]   Urine Culture (for pregnant, neutropenic or urologic patients or patients with an indwelling urinary catheter)     Status: Abnormal   Collection Time: 03/17/24 12:34 PM   Specimen: In/Out Cath Urine  Result Value Ref Range Status   Specimen Description   Final    IN/OUT CATH URINE Performed at [facility], [address], [place], [place] [address]    Special Requests   Final    NONE Performed at [facility], [address] [place], [place] [address]    Culture 5,000 COLONIES/mL ESCHERICHIA COLI (A)  Final   Report Status 03/20/2024 FINAL  Final  Organism ID, Bacteria ESCHERICHIA COLI (A)  Final      Susceptibility   Escherichia coli - MIC*    AMPICILLIN <=2 SENSITIVE Sensitive     CEFAZOLIN  <=4 SENSITIVE Sensitive     CEFEPIME <=0.12 SENSITIVE Sensitive     CEFTRIAXONE  <=0.25 SENSITIVE Sensitive     CIPROFLOXACIN  <=0.25 SENSITIVE Sensitive     GENTAMICIN <=1 SENSITIVE Sensitive     IMIPENEM <=0.25 SENSITIVE Sensitive     NITROFURANTOIN  <=16 SENSITIVE Sensitive     TRIMETH /SULFA  <=20 SENSITIVE Sensitive     AMPICILLIN/SULBACTAM <=2 SENSITIVE Sensitive     PIP/TAZO <=4 SENSITIVE Sensitive ug/mL    * 5,000 COLONIES/mL ESCHERICHIA COLI      Radiology Studies last 3 days: DG Chest 1 View Result Date: 03/19/2024 CLINICAL DATA:  Dyspnea. EXAM: CHEST  1 VIEW COMPARISON:  March 16, 2023 FINDINGS: Enteric tube is seen with its distal tip overlying the expected region of the gastric fundus. The heart size and mediastinal contours are within normal limits. There is mild right  basilar linear scarring and/or atelectasis. Mild to moderate severity left basilar scarring, atelectasis and/or infiltrate is seen. No pleural effusion or pneumothorax is identified. Multilevel degenerative changes are seen throughout the thoracic spine. IMPRESSION: 1. Mild to moderate severity left basilar scarring, atelectasis and/or infiltrate. 2. Mild right basilar linear scarring and/or atelectasis. Electronically Signed   By: Suzen Dials M.D.   On: 03/19/2024 23:00   DG Abd 1 View Result Date: 03/18/2024 CLINICAL DATA:  NG tube placement EXAM: ABDOMEN - 1 VIEW COMPARISON:  03/17/2024 FINDINGS: Enteric tube tip and side port overlie the proximal stomach. Upper gas pattern is unremarkable. IMPRESSION: Enteric tube tip and side port overlie the proximal stomach. Electronically Signed   By: Luke Bun M.D.   On: 03/18/2024 17:48   MR BRAIN WO CONTRAST Result Date: 03/17/2024 EXAM: MRI BRAIN WITHOUT CONTRAST 03/17/2024 10:44:29 PM TECHNIQUE: Multiplanar multisequence MRI of the head/brain was performed without the administration of intravenous contrast. COMPARISON: 01/17/2021 CLINICAL HISTORY: Mental status change, unknown cause. AMS. Type A dissection. FINDINGS: BRAIN AND VENTRICLES: Multifocal acute ischemia within both hemispheres affecting the middle and posterior cerebral artery territories. The largest lesion is in the left frontal lobe. No midline shift or other mass effect. Multifocal central chronic micro hemorrhage. Multifocal hyperintense T2-weighted signal within the cerebral white matter, most commonly due to chronic small vessel disease. The sella is unremarkable. Normal flow voids. ORBITS: No acute abnormality. SINUSES AND MASTOIDS: No acute abnormality. BONES AND SOFT TISSUES: Normal marrow signal. No acute soft tissue abnormality. IMPRESSION: 1. Multifocal acute ischemia within both hemispheres affecting the middle and posterior cerebral artery territories, with the largest lesion in  the left frontal lobe. No midline shift or other mass effect. 2. Multifocal central chronic microhemorrhage most consistent with chronic hypertensive angiopathy . 3. Multifocal hyperintense T2-weighted signal within the cerebral white matter, most commonly due to chronic small vessel disease. Electronically signed by: Franky Stanford MD 03/17/2024 11:36 PM EDT RP Workstation: [id number]   DG Skull 1-3 Views Result Date: 03/17/2024 CLINICAL DATA:  Metabolic encephalopathy, MRI EXAM: SKULL - 1-3 VIEW COMPARISON:  None Available. FINDINGS: AP and cross-table lateral views of the skull demonstrate nasogastric tube, incompletely assessed. No fracture or malalignment. Dental amalgam. No unexpected metallic density foreign body IMPRESSION: Negative for unexpected metallic density foreign body. Electronically Signed   By: Luke Bun M.D.   On: 03/17/2024 15:18   DG Abd 1 View Result Date: 03/17/2024 CLINICAL DATA:  Enteric tube placement EXAM: ABDOMEN - 1 VIEW COMPARISON:  CT abdomen and pelvis dated 03/14/2024 FINDINGS: Gastric/enteric tube tip projects over the stomach. Partially imaged nonobstructive bowel gas pattern. Left retrocardiac linear opacities. IMPRESSION: Gastric/enteric tube tip projects over the stomach. Electronically Signed   By: Limin  Xu M.D.   On: 03/17/2024 08:55       Time spent: 50 min     Laneta Blunt, DO [facility] 03/20/2024, 10:41 AM    Dictation software may have been used to generate the above note. Typos may occur and escape review in typed/dictated notes. Please contact Dr Blunt directly for clarity if needed.  Staff may message me via secure chat in Epic  but this may not receive an immediate response,  please page me for urgent matters!  If 7PM-7AM, please contact night coverage [email or web address]

## 2024-03-20 NOTE — Progress Notes (Addendum)
 Critical care note:  Date of note: 03/20/2024  Subjective:  The patient becomes tachypneic with respiratory distress without significant hypoxemia and was mouth breathing.  She was mildly lethargic.  No fever or chills.  No chest pain or palpitations.  No nausea or vomiting or abdominal pain.  Objective: Physical examination: Generally: Acutely ill middle-aged African-American female in mild respiratory distress with conversational dyspnea.  She was fairly lethargic. Vital signs: Respiratory rate was 33 and pulse continues 89% on room air and she was placed on nasal cannula initially at 2 L and later simple mask. Head - atraumatic, normocephalic.  Pupils - equal, round and reactive to light and accommodation. Extraocular movements are intact. No scleral icterus.  Oropharynx - moist mucous membranes and tongue. No pharyngeal erythema or exudate.  Neck - supple. No JVD. Carotid pulses 2+ bilaterally. No carotid bruits. No palpable thyromegaly or lymphadenopathy. Cardiovascular - regular rate and rhythm. Normal S1 and S2. No murmurs, gallops or rubs.  Lungs -diminished bibasilar breath sounds. Abdomen - soft and nontender. Positive bowel sounds. No palpable organomegaly or masses.  Extremities -trace bilateral lower extremity pitting edema, with no clubbing or cyanosis.  Neuro - grossly non-focal. Skin - no rashes. Breast, pelvic and rectal - deferred.  Labs and notes were reviewed.  VBG showed pH 7.25 and PCO251 with HCO3 of 22.4.  Stat CMP revealed potassium of 5.3 and CO2 19 with blood glucose of 252.  BUN 60 and creatinine 5.34 slightly above previous levels.  Albumin was 2.1.  BNP was 254.7 and CBC showed significant leukocytosis 21.9 with hemoglobin 8.7 hematocrit 27.4 platelets 299.  D-dimer was 1.79.  CBC showed leukocytosis of 21.9 with anemia that is stable.  Stat portable chest x-ray showed the following: 1. Mild to moderate severity left basilar scarring, atelectasis and/or  infiltrate. 2. Mild right basilar linear scarring and/or atelectasis.    Assessment/Plan: 1.  Acute hypoxic and hypercarbic respiratory failure likely related to acute on chronic diastolic CHF with EF of 65-70% and grade 1 diastolic dysfunction as of 12/10/2023.  Differential diagnosis would include aspiration though less likely.. - The patient was transferred to the stepdown unit bed. - In order BiPAP that was started in the ICU with a sitter observing the patient due to her left.  Pulsoxymetery improved to 98% on BiPAP. - Will add diuresis with IV Lasix . - Will continue with the current plan of care. - I discussed her guarded prognosis with her brother Mr. Nachelle Negrette wanted to continue aggressive management for now but understands that should she fail or not tolerate BiPAP that we will be pursuing comfort measures to respect DNR and DNI wishes.  Authorized and performed by: Madison Peaches, MD Total critical care time:  35      minutes. Due to a high probability of clinically significant, life-threatening deterioration, the patient required my highest level of preparedness to intervene emergently and I personally spent this critical care time directly and personally managing the patient.  This critical care time included obtaining a history, examining the patient, pulse oximetry, ordering and review of studies, arranging urgent treatment with development of management plan, evaluation of patient's response to treatment, frequent reassessment, and discussions with other providers. This critical care time was performed to assess and manage the high probability of imminent, life-threatening deterioration that could result in multiorgan failure.  It was exclusive of separately billable procedures and treating other patients and teaching time.

## 2024-03-20 NOTE — Progress Notes (Addendum)
 Daily Progress Note   [name]       Date: 03/20/2024 DOB: October 05, 1970  Age: 53 y.o. MRN#: [id number] Attending Physician: Marsa Edelman, DO Primary Care Physician: [place] Admit Date: 03/14/2024  Reason for Consultation/Follow-up: Establishing goals of care  Subjective: See patient's nurse from her facility Shevonne is at bedside.  Notes, labs, diagnostics reviewed.     Overnight, patient moved to ICU as she required BiPAP and would be unable to remove it herself.  In to see patient.  She is currently resting in bed with a sitter and a mitten in place as she is actively trying to remove her BiPAP mask.  Patient's facility nurse Larita is at bedside.  She discusses patient's hospitalization and the updates that she has been provided.  She discusses that she has tried to reach patient's brother both her phone, and the patient's phone unsuccessfully.  Patient did not open her eyes, but appeared to very weakly shake her head when [facility] touched her face and asked her do you want to keep doing this. Larita states patient's twin brother died recently, and patient told her she felt her time was limited as well.  She states patient is a woman of faith; she advises that patient would not want to live with quality of life she would have moving forward.  She states patient and brother have not seen each other in years.  Attending in to bedside.  Discussed status and hierarchy of decision making.   Attending called patient's brother on speaker phone, on hospital Ascom phone.  She discussed patient's status and diagnoses. Discussed comfort measures and limited prognosis.  Discussed potential for hospice facility placement depending on how patient does off  BiPAP, versus potential for hospital death if she is unstable to transport.  Brother acknowledges and agrees to comfort care with transfer to hospice facility if patient stable to transport.    Length of Stay: 6  Current Medications: Scheduled Meds:   Chlorhexidine  Gluconate Cloth  6 each Topical Daily    Continuous Infusions:  fentaNYL  infusion INTRAVENOUS      PRN Meds: artificial tears, fentaNYL , haloperidol  **OR** haloperidol  **OR** haloperidol  lactate, LORazepam  **OR** LORazepam  **OR** LORazepam , ondansetron  **OR** ondansetron  (ZOFRAN ) IV, mouth rinse  Physical Exam Constitutional:      Comments: Eyes closed.  Pulmonary:     Comments: On BIPAP Skin:    General: Skin is warm and dry.  Psychiatric:     Comments: Agitated and trying to remove BIPAP mask.              Vital Signs: BP 132/64   Pulse 93   Temp 98.7 F (37.1 C) (Axillary)   Resp (!) 29   Ht 5' 1 (1.549 m)   Wt 106.8 kg   LMP 04/26/2018 (Approximate)   SpO2 96%   BMI 44.49 kg/m  SpO2: SpO2: 96 % O2 Device: O2 Device: Bi-PAP O2 Flow Rate: O2 Flow Rate (L/min): 3 L/min  Intake/output summary:  Intake/Output Summary (Last 24 hours) at 03/20/2024 1056 Last data filed at 03/20/2024 0810 Gross per 24 hour  Intake 120 ml  Output 400 ml  Net -280 ml   LBM: Last BM Date :  (unknown) Baseline Weight: Weight: 118.1 kg Most recent weight: Weight: 106.8 kg    Patient Active Problem List   Diagnosis Date Noted   Type A aortic dissection (HCC) 03/14/2024   Hypophosphatemia 03/06/2024   Atherosclerosis of artery of extremity with intermittent claudication (HCC) 05/17/2023   Achalasia of esophagus 05/31/2022   Pain of lower extremity 12/19/2021   Achalasia 08/17/2021   Dysphagia 08/17/2021   Acute CVA (cerebrovascular accident) (HCC) 01/18/2021   Dysarthria    Urinary tract infection    Abnormal finding on lung imaging 12/27/2020   Severe episode of recurrent major depressive disorder, with psychotic  features (HCC)    Cerebrovascular accident (CVA) (HCC) 12/06/2019   AKI (acute kidney injury) (HCC) 12/06/2019   LVH (left ventricular hypertrophy) 08/10/2019   Edema of lower extremity 11/26/2018   Loss of appetite 11/26/2018   Urinary incontinence 11/26/2018   Allergic rhinitis 11/14/2018   Deep venous thrombosis (HCC) 11/14/2018   Nicotine  dependence 11/14/2018   Hemorrhagic stroke (HCC)    Diastolic dysfunction    Type 2 diabetes mellitus (HCC)    Anemia of chronic disease    Chronic obstructive pulmonary disease (HCC)    ICH (intracerebral hemorrhage) (HCC) 08/30/2018   Hypertensive heart disease without heart failure 07/04/2018   Nonrheumatic aortic valve insufficiency 07/04/2018   Esophageal obstruction due to food impaction, recurrent    Class 2 severe obesity due to excess calories with serious comorbidity and body mass index (BMI) of 35.0 to 35.9 in adult [facility])    Peripheral vascular disease (HCC)    History of osteomyelitis    History of amputation of lesser toe (HCC)    Pulmonary embolism (HCC) 06/22/2018   Microcytic anemia 06/17/2018   At risk for adverse drug reaction 06/11/2018   Critical lower limb ischemia (HCC) 05/27/2018   GI bleeding 04/02/2018   Hypokalemia 04/02/2018   Polysubstance abuse (HCC) 04/02/2018   Diabetic foot ulcer (HCC) 04/02/2018   Tinea pedis 03/07/2018   Diabetic peripheral neuropathy (HCC) 01/24/2018   Cannabis use disorder, moderate, dependence (HCC) 05/19/2017   Cocaine use disorder, moderate, dependence (HCC) 05/19/2017   CKD (chronic kidney disease) stage 3, GFR 30-59 ml/min (HCC) 02/17/2017   Noncompliance with medications 02/17/2017   Suicide attempt (HCC) 11/06/2016   Mild cognitive impairment 03/21/2016   Moderate episode of recurrent major depressive disorder (HCC) 03/21/2016   History of CVA with residual deficit 12/30/2015   Schizoaffective disorder, bipolar type (HCC) 10/07/2015   OSA (obstructive sleep apnea) 06/09/2015    Vitamin D  deficiency 01/06/2015   Bilateral knee pain 01/05/2015   Numbness and tingling  in right hand 01/05/2015   Insomnia 12/07/2014   Right-sided lacunar infarction (HCC) 09/15/2014   Left hemiparesis (HCC) 09/15/2014   Dyspnea    Back pain 09/11/2013   Psychoactive substance-induced organic mood disorder (HCC) 05/28/2013   Cocaine abuse with cocaine-induced mood disorder (HCC) 05/28/2013   Cannabis dependence with cannabis-induced anxiety disorder (HCC) 05/28/2013   Morbid obesity with BMI of 45.0-49.9, adult (HCC) 04/25/2012   Chest pain 04/17/2012   Hypertension 04/17/2012   DM (diabetes mellitus) with peripheral vascular complication (HCC) 04/17/2012   Hyperlipidemia 04/17/2012   Tobacco use 04/17/2012    Palliative Care Assessment & Plan    Recommendations/Plan: Patient shifted to comfort care  Code Status:    Code Status Orders  (From admission, onward)           Start     Ordered   03/20/24 1035  Do not attempt resuscitation (DNR) - Comfort care  Continuous       Question Answer Comment  If patient has no pulse and is not breathing Do Not Attempt Resuscitation   In Pre-Arrest Conditions (Patient Is Breathing and Has a Pulse) Provide comfort measures. Relieve any mechanical airway obstruction. Avoid transfer unless required for comfort.   Consent: Discussion documented in EHR or advanced directives reviewed      03/20/24 1045           Code Status History     Date Active Date Inactive Code Status Order ID Comments User Context   03/14/2024 0739 03/20/2024 1045 Limited: Do not attempt resuscitation (DNR) -DNR-LIMITED -Do Not Intubate/DNI  [id number]  Shellia [name] BIRCH, NP ED   03/05/2024 0540 03/07/2024 0223 Full Code [id number]  Cleatus Delayne GAILS, MD ED   05/31/2022 1959 06/05/2022 2105 Full Code [id number]  Sim Emery CROME, MD ED   01/17/2021 0039 01/20/2021 2313 Full Code [id number]  Seena Marsa NOVAK, MD ED   12/27/2020 2029 01/03/2021 1917 Full Code [id number]   Lonzell Emeline HERO, DO ED   12/15/2019 2254 12/16/2019 1946 Full Code [id number]  Sim Emery CROME, MD ED   12/06/2019 2254 12/10/2019 1848 Full Code [id number]  Alfornia Madison, MD ED   08/30/2018 1504 09/21/2018 1810 Full Code [id number]  Voncile Isles, MD ED   06/22/2018 1822 06/25/2018 2048 Full Code [id number]  Chrystal Collar, MD ED   05/27/2018 1516 06/10/2018 1853 Full Code [id number]  Sheree Penne Bruckner, MD Inpatient   04/02/2018 0931 04/12/2018 1917 Full Code [id number]  Barbarann Nest, MD ED   01/11/2018 1939 01/15/2018 1509 Full Code [id number]  Jacquetta Sharlot GRADE, NP Inpatient   09/08/2017 1554 09/09/2017 1845 Full Code [id number]  Pisciotta, Nat RIGGERS ED   05/18/2017 1701 05/26/2017 1057 Full Code [id number]  Rankin, Shuvon B, NP Inpatient   05/17/2017 2150 05/18/2017 1640 Full Code [id number]  Dreama Longs, MD ED   10/07/2015 2311 10/21/2015 1706 Full Code [id number]  Arno Earleen BRAVO, NP Inpatient   10/07/2015 1619 10/07/2015 2311 Full Code [id number]  Arloa Chroman, PA-C ED   09/11/2014 1516 09/26/2014 2045 Full Code [id number]  Pegge Toribio PARAS, PA-C Inpatient   09/08/2014 1357 09/11/2014 1516 Full Code [id number]  Adina Buel HERO, MD Inpatient   08/28/2014 1435 08/29/2014 2118 Full Code [id number]  Evelena Chauncey BIRCH, MD Inpatient   09/03/2013 2117 09/04/2013 1722 Full Code [id number]  Alto Lynwood SAUNDERS, MD Inpatient   05/22/2013 2346 05/26/2013 2116 Full Code [id number]  [place] Jeoffrey HERO, MD Inpatient   04/17/2012 1735 04/20/2012 2002 Full Code [id number]  Norman Males  Zebedee, RN ED       Prognosis: Hours to days.  Care plan was discussed with attending at bedside  Thank you for allowing the Palliative Medicine Team to assist in the care of this patient.    Camelia Lewis, NP  Please contact Palliative Medicine Team phone at [phone] for questions and concerns.

## 2024-03-21 DIAGNOSIS — I7100 Dissection of unspecified site of aorta: Secondary | ICD-10-CM | POA: Diagnosis not present

## 2024-03-28 NOTE — Significant Event (Signed)
 Fentanyl  2,500 mcg/250 mL: RN obtained from pharmacy as next dose in pyxis was assigned to ICU and was unavailable for me to pull from 1C pyxis and pharmacy techs were busy. Unable to waste unused Fentanyl  in Pyxis. 225 mL wasted in med room with Manuelita Lunger, RN

## 2024-03-28 NOTE — Death Summary Note (Signed)
 DEATH SUMMARY   Patient Details  Name: Diamond Rice MRN: [id number] DOB: [birth year] ERE:Ynldzrjood, Doctors Making Admission/Discharge Information   Admit Date:  04-03-2024  Date of Death: Date of Death: Apr 10, 2024  Time of Death: Time of Death: 0330  Length of Stay: 7   Principal Cause of death: Stroke  Hospital Diagnoses: Principal Problem:   Type A aortic dissection [facility])    Hospital course / significant events: 53 year old female with a past medical history of achalasia, esophageal impactions, polysubstance use, stroke with right hemiparesis (bed bound), hypertension, COPD, diabetes mellitus, and morbid obesity who presents from her skilled nursing facility to the emergency department on 04/03/2024 with complaints of acute onset nausea/vomiting/chest pain and abdominal pain.   03-Apr-2024: Type A Aortic Dissection, Vascular Surgery consulted, recommended transfer to tertiary care however declined by [facility] - recs  medically treating.  PCCM to admit, code Status changed to DNR/DNI. 07/19- AKI on ACE inhibitor which we will dc.  BP was better controlled this am. Palliative care consult 07/20- weaning off precedex .  Remains on room air.  Renal impairment worse.  Esmolol  drip. ICU team reached out to [facility] cardiothoracic surgery - due to her hemiparesis and bedbound status with comorbidities she is a non operable candidate.  07/21: remains on Esmolol  and Cleviprex  infusions for BP control.  Precedex  is off. Remains Encephalopathic.  NG tube placed to be able to start enteral antihypertensives in attempt to wean IV infusions.  With worsening AKI, hyperkalemia, Leukocytosis 07/22: MRI concerning for acute multifocal ischemia within both hemispheres. Metoprolol  and Hydralazine  does increased, weaned off Esmolol  and Cleviprex  infusions.  Creatinine continues to worsen with persistent mild hyperkalemia, UOP 525 cc last 24 hrs (net +10L). 07/23: hospitalist service assumes care from ICU team.  Palliative care team spoke w/ brother - continue treatment for now, hospice to eval, low threshold for comfort measures if she decompensates.  07/24: resp distress overnight, on BiPAP - Pt not tolerating BiPAP and in distress/agitated. Called brother in patient's room, paliative care present and pt's friend present - agreement w/ brother for comfort measures, DC aggressive interventions.  04/10/24: patient time of death 03:30    Consultants:  Vascular Surgery  Palliative care  ICU  Procedures/Surgeries: none      ASSESSMENT & PLAN:   COMFORT MEASURES STATUS STOP non-comfort interventions Fentanyl  gtt and once comfortable can remove BiPAP and NG tube   Type A Aortic Dissection PMHx: HTN, HLD, stroke with right hemiparesis Vascular Surgery consulted, appreciate input ~ no surgical intervention available here at [facility]. Case was also discussed with both [facility] & [facility] ~ deemed to be VERY POOR SURGICAL CANDIDATE and refused transfer ~ recommended medical management Was in ICU on Esmolol  and Nicardipine  drips ~ weaned off See hospital course  D/c treatments on comfort measures   COPD without acute exacerbation Hx tobacco use  OSA Acute Kidney Injury Hyperkalemia - improved/resolved Anemia  UTI recent E. Coli UTI (ESBL) 03/05/24 Diabetes Mellitus Type II Acute Metabolic Encephalopathy Acute CVA's ~ suspect etiology is from Aortic Dissection  PMHx: CVA with right sided hemiparesis Class 3 obesity based on BMI: Body mass index is 44.49 kg/m MRI Brain 7/21 with multifocal acute ischemia within both hemispheres affecting the MCA and PCA territories, no midline shift or mass effect noted.  Multifocal central chronic microhemorrhage most consistent with chronic hypertensive angiopathy . Multifocal hyperintense T2-weighted signal within the cerebral white matter, most commonly due to chronic small vessel disease. D/c treatments  on comfort measures                The results of significant diagnostics from this hospitalization (including imaging, microbiology, ancillary and laboratory) are listed below for reference.   Significant Diagnostic Studies: DG Chest 1 View Result Date: 03/19/2024 CLINICAL DATA:  Dyspnea. EXAM: CHEST  1 VIEW COMPARISON:  March 16, 2023 FINDINGS: Enteric tube is seen with its distal tip overlying the expected region of the gastric fundus. The heart size and mediastinal contours are within normal limits. There is mild right basilar linear scarring and/or atelectasis. Mild to moderate severity left basilar scarring, atelectasis and/or infiltrate is seen. No pleural effusion or pneumothorax is identified. Multilevel degenerative changes are seen throughout the thoracic spine. IMPRESSION: 1. Mild to moderate severity left basilar scarring, atelectasis and/or infiltrate. 2. Mild right basilar linear scarring and/or atelectasis. Electronically Signed   By: Suzen Dials M.D.   On: 03/19/2024 23:00   DG Abd 1 View Result Date: 03/18/2024 CLINICAL DATA:  NG tube placement EXAM: ABDOMEN - 1 VIEW COMPARISON:  03/17/2024 FINDINGS: Enteric tube tip and side port overlie the proximal stomach. Upper gas pattern is unremarkable. IMPRESSION: Enteric tube tip and side port overlie the proximal stomach. Electronically Signed   By: Luke Bun M.D.   On: 03/18/2024 17:48   MR BRAIN WO CONTRAST Result Date: 03/17/2024 EXAM: MRI BRAIN WITHOUT CONTRAST 03/17/2024 10:44:29 PM TECHNIQUE: Multiplanar multisequence MRI of the head/brain was performed without the administration of intravenous contrast. COMPARISON: 01/17/2021 CLINICAL HISTORY: Mental status change, unknown cause. AMS. Type A dissection. FINDINGS: BRAIN AND VENTRICLES: Multifocal acute ischemia within both hemispheres affecting the middle and posterior cerebral artery territories. The largest lesion is in the left frontal lobe. No midline shift or other mass effect. Multifocal central  chronic micro hemorrhage. Multifocal hyperintense T2-weighted signal within the cerebral white matter, most commonly due to chronic small vessel disease. The sella is unremarkable. Normal flow voids. ORBITS: No acute abnormality. SINUSES AND MASTOIDS: No acute abnormality. BONES AND SOFT TISSUES: Normal marrow signal. No acute soft tissue abnormality. IMPRESSION: 1. Multifocal acute ischemia within both hemispheres affecting the middle and posterior cerebral artery territories, with the largest lesion in the left frontal lobe. No midline shift or other mass effect. 2. Multifocal central chronic microhemorrhage most consistent with chronic hypertensive angiopathy . 3. Multifocal hyperintense T2-weighted signal within the cerebral white matter, most commonly due to chronic small vessel disease. Electronically signed by: Franky Stanford MD 03/17/2024 11:36 PM EDT RP Workstation: [id number]   DG Skull 1-3 Views Result Date: 03/17/2024 CLINICAL DATA:  Metabolic encephalopathy, MRI EXAM: SKULL - 1-3 VIEW COMPARISON:  None Available. FINDINGS: AP and cross-table lateral views of the skull demonstrate nasogastric tube, incompletely assessed. No fracture or malalignment. Dental amalgam. No unexpected metallic density foreign body IMPRESSION: Negative for unexpected metallic density foreign body. Electronically Signed   By: Luke Bun M.D.   On: 03/17/2024 15:18   DG Abd 1 View Result Date: 03/17/2024 CLINICAL DATA:  Enteric tube placement EXAM: ABDOMEN - 1 VIEW COMPARISON:  CT abdomen and pelvis dated 03/14/2024 FINDINGS: Gastric/enteric tube tip projects over the stomach. Partially imaged nonobstructive bowel gas pattern. Left retrocardiac linear opacities. IMPRESSION: Gastric/enteric tube tip projects over the stomach. Electronically Signed   By: Limin  Xu M.D.   On: 03/17/2024 08:55   CT CHEST ABDOMEN PELVIS W CONTRAST Result Date: 03/14/2024 EXAM: CT CHEST, ABDOMEN AND PELVIS WITH CONTRAST 03/14/2024 05:41:52 AM  TECHNIQUE: CT of the chest, abdomen  and pelvis was performed with the administration of intravenous contrast. Multiplanar reformatted images are provided for review. Automated exposure control, iterative reconstruction, and/or weight based adjustment of the mA/kV was utilized to reduce the radiation dose to as low as reasonably achievable. COMPARISON: CT AP 11/19/2022 and CT angio chest 07/29/2021. CLINICAL HISTORY: Multiple complaints, poor historian-history of achalasia and food impaction here with nausea chest pain upper abdominal pain, please scan from neck down to chest check for food impaction, as well as abdomen pelvis given diffuse tenderness. Table formatting from the original note was not included.; Images from the original note were not included.; Per ed notes; Pt. Arrives via [facility] from [facility] C/O nausea and emesis after attempting to eat. Hx of esophageal narrowing that required stretching. Per EMS, she has called out several times for the same issue. FINDINGS: CHEST: MEDIASTINUM: Heart size is upper limits of normal. No pericardial effusion. There is a dilated and patulous esophagus which may be related to chronic dysmotility. The central airways are grossly patent. THORACIC LYMPH NODES: No mediastinal, hilar or axillary lymphadenopathy. LUNGS AND PLEURA: The pulmonary parenchymal detail is diminished due to motion artifact. No pleural fluid, interstitial edema or pneumothorax. No suspicious nodule or mass identified. ABDOMEN AND PELVIS: LIVER: The liver is unremarkable. GALLBLADDER AND BILE DUCTS: Gallbladder is unremarkable. No biliary ductal dilatation. SPLEEN: No acute abnormality. PANCREAS: No acute abnormality. ADRENAL GLANDS: No acute abnormality. KIDNEYS, URETERS AND BLADDER: There is complete hypoenhancement of the upper pole of left kidney with only a small amount of enhancement involving the anterior cortex of the interpolar left kidney and inferior pole of the left kidney.  Right Kidney appears well perfused and enhances normally. No stones in the kidneys or ureters. No hydronephrosis. No perinephric or periureteral stranding. Urinary bladder is unremarkable. GI AND BOWEL: Stomach demonstrates no acute abnormality. There is no bowel obstruction. PERITONEUM AND RETROPERITONEUM: No ascites. No free air. VASCULATURE: There is a dissection flap originating at the base of the proximal ascending thoracic aorta and extending to below the level of the renal arteries and proximal to the aortic bifurcation. No dissection flap identified extending into the arch vessels. There is absent contrast opacification within the false lumen distal to the posterior arch. The unopacified false lumen is greater than the opacified true lumen. According to the DeBakey classification, the finding is consistent with DeBakey type I aortic dissection. Aortic atherosclerotic calcification and coronary artery calcification. The ascending thoracic aorta measures 4 cm. The dissection flap extends into the iliac arteries. The distal branches of the celiac artery appear patent and well opacified. The superior mesenteric artery arises off the true lumen and is patent and well opacified. Right renal artery arises off the true lumen. The left renal artery arises off the false lumen with absent contrast opacification. Atherosclerotic calcifications are noted involving the abdominal aorta as well as the iliac vasculature. No aneurysmal dilatation of the abdominal aorta. ABDOMINAL AND PELVIS LYMPH NODES: No lymphadenopathy. REPRODUCTIVE ORGANS: There is a fibroid uterus which appears similar to previous exam. Exophytic fibroid arising off the left side of the uterine fundus measures 4.1 cm, unchanged. BONES AND SOFT TISSUES: Lumbar degenerative disc disease. No acute osseous abnormality. No focal soft tissue abnormality. IMPRESSION: 1. DeBakey type I aortic dissection with absent contrast opacification within the false lumen  distal to the posterior arch, greater than the opacified true lumen. No dissection flap identified extending into the arch vessels. 2. Nonocclusive Dissection flap extends into the proximal celiac artery 3.  The left renal artery appears to arise off the false lumen with absent contrast opacification signs of left renal infarct involving the entire upper pole and portions of the inferior pole. 4. Dilated and patulous esophagus, possibly related to chronic dysmotility. 5. Mild aneurysmal dilatation of the ascending thoracic aorta measuring 4 cm. 6. Aortic atherosclerosis and coronary artery calcifications 7. Critical findings were called to the ordering provider Dr. Cyrena at the time of interpretation 03/14/24 at 6:55 am Electronically signed by: Waddell Calk MD 03/14/2024 06:16 AM EDT RP Workstation: [id number]    Microbiology: Recent Results (from the past 240 hours)  MRSA Next Gen by PCR, Nasal     Status: None   Collection Time: 03/14/24  8:29 AM   Specimen: Nasal Mucosa; Nasal Swab  Result Value Ref Range Status   MRSA by PCR Next Gen NOT DETECTED NOT DETECTED Final    Comment: (NOTE) The GeneXpert MRSA Assay (FDA approved for NASAL specimens only), is one component of a comprehensive MRSA colonization surveillance program. It is not intended to diagnose MRSA infection nor to guide or monitor treatment for MRSA infections. Test performance is not FDA approved in patients less than 40 years old. Performed at [facility], [address] [place], [place] [address]   MRSA Next Gen by PCR, Nasal     Status: None   Collection Time: 03/14/24 11:11 AM   Specimen: Nasal Mucosa; Nasal Swab  Result Value Ref Range Status   MRSA by PCR Next Gen NOT DETECTED NOT DETECTED Final    Comment: (NOTE) The GeneXpert MRSA Assay (FDA approved for NASAL specimens only), is one component of a comprehensive MRSA colonization surveillance program. It is not intended to diagnose MRSA infection nor to  guide or monitor treatment for MRSA infections. Test performance is not FDA approved in patients less than 37 years old. Performed at [facility], [address], [place], [place] [address]   Urine Culture (for pregnant, neutropenic or urologic patients or patients with an indwelling urinary catheter)     Status: Abnormal   Collection Time: 03/17/24 12:34 PM   Specimen: In/Out Cath Urine  Result Value Ref Range Status   Specimen Description   Final    IN/OUT CATH URINE Performed at [facility], [address], [place], [place] [address]    Special Requests   Final    NONE Performed at [facility], [address] [place], [place] [address]    Culture 5,000 COLONIES/mL ESCHERICHIA COLI (A)  Final   Report Status 03/20/2024 FINAL  Final   Organism ID, Bacteria ESCHERICHIA COLI (A)  Final      Susceptibility   Escherichia coli - MIC*    AMPICILLIN <=2 SENSITIVE Sensitive     CEFAZOLIN  <=4 SENSITIVE Sensitive     CEFEPIME <=0.12 SENSITIVE Sensitive     CEFTRIAXONE  <=0.25 SENSITIVE Sensitive     CIPROFLOXACIN  <=0.25 SENSITIVE Sensitive     GENTAMICIN <=1 SENSITIVE Sensitive     IMIPENEM <=0.25 SENSITIVE Sensitive     NITROFURANTOIN  <=16 SENSITIVE Sensitive     TRIMETH /SULFA  <=20 SENSITIVE Sensitive     AMPICILLIN/SULBACTAM <=2 SENSITIVE Sensitive     PIP/TAZO <=4 SENSITIVE Sensitive ug/mL    * 5,000 COLONIES/mL ESCHERICHIA COLI    Time spent: 30 minutes  Signed: Laneta Blunt, DO 04-06-24

## 2024-03-28 DEATH — deceased

## 2024-04-01 ENCOUNTER — Encounter: Payer: Self-pay | Admitting: Osteopathic Medicine

## 2024-06-30 ENCOUNTER — Ambulatory Visit (INDEPENDENT_AMBULATORY_CARE_PROVIDER_SITE_OTHER): Payer: 59 | Admitting: Vascular Surgery

## 2024-06-30 ENCOUNTER — Encounter (INDEPENDENT_AMBULATORY_CARE_PROVIDER_SITE_OTHER): Payer: 59
